# Patient Record
Sex: Female | Born: 1940 | Race: White | Hispanic: No | Marital: Married | Smoking: Former smoker
Health system: Southern US, Community
[De-identification: ages and names within clinical notes are randomized; demographics above are authoritative.]

## PROBLEM LIST (undated history)

## (undated) DIAGNOSIS — D751 Secondary polycythemia: Secondary | ICD-10-CM

## (undated) DIAGNOSIS — F329 Major depressive disorder, single episode, unspecified: Secondary | ICD-10-CM

## (undated) DIAGNOSIS — G709 Myoneural disorder, unspecified: Secondary | ICD-10-CM

## (undated) DIAGNOSIS — B372 Candidiasis of skin and nail: Secondary | ICD-10-CM

## (undated) DIAGNOSIS — E785 Hyperlipidemia, unspecified: Secondary | ICD-10-CM

## (undated) DIAGNOSIS — I219 Acute myocardial infarction, unspecified: Secondary | ICD-10-CM

## (undated) DIAGNOSIS — I10 Essential (primary) hypertension: Secondary | ICD-10-CM

## (undated) DIAGNOSIS — R739 Hyperglycemia, unspecified: Secondary | ICD-10-CM

## (undated) DIAGNOSIS — L409 Psoriasis, unspecified: Secondary | ICD-10-CM

## (undated) DIAGNOSIS — M109 Gout, unspecified: Secondary | ICD-10-CM

## (undated) DIAGNOSIS — C801 Malignant (primary) neoplasm, unspecified: Secondary | ICD-10-CM

## (undated) DIAGNOSIS — I2510 Atherosclerotic heart disease of native coronary artery without angina pectoris: Secondary | ICD-10-CM

## (undated) DIAGNOSIS — E669 Obesity, unspecified: Secondary | ICD-10-CM

## (undated) DIAGNOSIS — J309 Allergic rhinitis, unspecified: Secondary | ICD-10-CM

## (undated) DIAGNOSIS — B009 Herpesviral infection, unspecified: Secondary | ICD-10-CM

## (undated) DIAGNOSIS — A0472 Enterocolitis due to Clostridium difficile, not specified as recurrent: Secondary | ICD-10-CM

## (undated) DIAGNOSIS — F32A Depression, unspecified: Secondary | ICD-10-CM

## (undated) DIAGNOSIS — K7460 Unspecified cirrhosis of liver: Secondary | ICD-10-CM

## (undated) HISTORY — PX: BREAST SURGERY: SHX581

## (undated) HISTORY — PX: TOTAL ABDOMINAL HYSTERECTOMY W/ BILATERAL SALPINGOOPHORECTOMY: SHX83

## (undated) HISTORY — DX: Depression, unspecified: F32.A

## (undated) HISTORY — DX: Acute myocardial infarction, unspecified: I21.9

## (undated) HISTORY — PX: COLONOSCOPY: SHX174

## (undated) HISTORY — DX: Gout, unspecified: M10.9

## (undated) HISTORY — DX: Unspecified cirrhosis of liver: K74.60

## (undated) HISTORY — DX: Psoriasis, unspecified: L40.9

## (undated) HISTORY — DX: Allergic rhinitis, unspecified: J30.9

## (undated) HISTORY — DX: Secondary polycythemia: D75.1

## (undated) HISTORY — DX: Major depressive disorder, single episode, unspecified: F32.9

## (undated) HISTORY — DX: Obesity, unspecified: E66.9

## (undated) HISTORY — DX: Malignant (primary) neoplasm, unspecified: C80.1

## (undated) HISTORY — DX: Essential (primary) hypertension: I10

## (undated) HISTORY — PX: TONSILLECTOMY: SUR1361

## (undated) HISTORY — DX: Myoneural disorder, unspecified: G70.9

## (undated) HISTORY — DX: Enterocolitis due to Clostridium difficile, not specified as recurrent: A04.72

## (undated) HISTORY — DX: Atherosclerotic heart disease of native coronary artery without angina pectoris: I25.10

## (undated) HISTORY — PX: ANKLE FRACTURE SURGERY: SHX122

## (undated) HISTORY — DX: Candidiasis of skin and nail: B37.2

## (undated) HISTORY — PX: CATARACT EXTRACTION, BILATERAL: SHX1313

## (undated) HISTORY — DX: Hyperlipidemia, unspecified: E78.5

---

## 2006-11-18 LAB — HM COLONOSCOPY: HM Colonoscopy: ABNORMAL

## 2007-10-04 ENCOUNTER — Encounter: Payer: Self-pay | Admitting: Family Medicine

## 2008-02-13 ENCOUNTER — Encounter: Payer: Self-pay | Admitting: Family Medicine

## 2008-07-08 DIAGNOSIS — I10 Essential (primary) hypertension: Secondary | ICD-10-CM | POA: Insufficient documentation

## 2008-07-08 DIAGNOSIS — E669 Obesity, unspecified: Secondary | ICD-10-CM | POA: Insufficient documentation

## 2008-09-30 ENCOUNTER — Encounter: Payer: Self-pay | Admitting: Family Medicine

## 2009-01-21 ENCOUNTER — Ambulatory Visit: Payer: Self-pay | Admitting: Occupational Medicine

## 2009-01-21 DIAGNOSIS — S93409A Sprain of unspecified ligament of unspecified ankle, initial encounter: Secondary | ICD-10-CM | POA: Insufficient documentation

## 2009-04-01 ENCOUNTER — Encounter: Payer: Self-pay | Admitting: Family Medicine

## 2009-07-08 ENCOUNTER — Encounter: Payer: Self-pay | Admitting: Family Medicine

## 2009-07-08 LAB — CONVERTED CEMR LAB
Albumin: 3.8 g/dL
Cholesterol: 207 mg/dL
Hgb A1c MFr Bld: 10.8 %
Microalb Creat Ratio: 66.6 mg/g
Potassium: 4.3 meq/L
Sodium: 134 meq/L
Total Protein: 6.7 g/dL
Triglycerides: 338 mg/dL

## 2009-09-13 ENCOUNTER — Ambulatory Visit: Payer: Self-pay | Admitting: Family Medicine

## 2009-09-13 DIAGNOSIS — F329 Major depressive disorder, single episode, unspecified: Secondary | ICD-10-CM | POA: Insufficient documentation

## 2009-09-13 DIAGNOSIS — L03119 Cellulitis of unspecified part of limb: Secondary | ICD-10-CM

## 2009-09-13 DIAGNOSIS — L02419 Cutaneous abscess of limb, unspecified: Secondary | ICD-10-CM | POA: Insufficient documentation

## 2009-09-14 ENCOUNTER — Ambulatory Visit: Payer: Self-pay | Admitting: Family Medicine

## 2009-09-14 ENCOUNTER — Encounter: Payer: Self-pay | Admitting: Family Medicine

## 2009-09-17 ENCOUNTER — Telehealth: Payer: Self-pay | Admitting: Family Medicine

## 2009-09-23 ENCOUNTER — Encounter (HOSPITAL_BASED_OUTPATIENT_CLINIC_OR_DEPARTMENT_OTHER): Admission: RE | Admit: 2009-09-23 | Discharge: 2009-10-30 | Payer: Self-pay | Admitting: Internal Medicine

## 2009-11-24 ENCOUNTER — Ambulatory Visit: Payer: Self-pay | Admitting: Family Medicine

## 2009-11-24 ENCOUNTER — Telehealth: Payer: Self-pay | Admitting: Family Medicine

## 2009-11-24 DIAGNOSIS — D751 Secondary polycythemia: Secondary | ICD-10-CM

## 2009-11-24 DIAGNOSIS — I10 Essential (primary) hypertension: Secondary | ICD-10-CM

## 2009-11-24 DIAGNOSIS — L408 Other psoriasis: Secondary | ICD-10-CM

## 2009-11-25 ENCOUNTER — Encounter: Payer: Self-pay | Admitting: Family Medicine

## 2009-11-25 LAB — CONVERTED CEMR LAB
Eosinophils Relative: 3 % (ref 0–5)
HCT: 48 % — ABNORMAL HIGH (ref 36.0–46.0)
Hemoglobin: 15.6 g/dL — ABNORMAL HIGH (ref 12.0–15.0)
Lymphocytes Relative: 20 % (ref 12–46)
MCV: 92.5 fL (ref 78.0–100.0)
RDW: 13.6 % (ref 11.5–15.5)

## 2009-11-26 ENCOUNTER — Encounter (INDEPENDENT_AMBULATORY_CARE_PROVIDER_SITE_OTHER): Payer: Self-pay | Admitting: *Deleted

## 2009-12-02 ENCOUNTER — Encounter: Payer: Self-pay | Admitting: Family Medicine

## 2009-12-15 ENCOUNTER — Ambulatory Visit: Payer: Self-pay | Admitting: Family Medicine

## 2009-12-15 DIAGNOSIS — E785 Hyperlipidemia, unspecified: Secondary | ICD-10-CM

## 2009-12-22 ENCOUNTER — Ambulatory Visit: Payer: Self-pay | Admitting: Family Medicine

## 2009-12-22 DIAGNOSIS — L0291 Cutaneous abscess, unspecified: Secondary | ICD-10-CM | POA: Insufficient documentation

## 2009-12-22 DIAGNOSIS — L0390 Cellulitis, unspecified: Secondary | ICD-10-CM

## 2009-12-24 ENCOUNTER — Encounter: Payer: Self-pay | Admitting: Family Medicine

## 2010-01-09 ENCOUNTER — Encounter: Payer: Self-pay | Admitting: Family Medicine

## 2010-01-12 ENCOUNTER — Ambulatory Visit: Payer: Self-pay | Admitting: Family Medicine

## 2010-02-17 ENCOUNTER — Ambulatory Visit: Payer: Self-pay | Admitting: Family Medicine

## 2010-02-17 DIAGNOSIS — R0602 Shortness of breath: Secondary | ICD-10-CM | POA: Insufficient documentation

## 2010-02-24 ENCOUNTER — Encounter: Payer: Self-pay | Admitting: Family Medicine

## 2010-02-26 ENCOUNTER — Encounter: Payer: Self-pay | Admitting: Family Medicine

## 2010-03-03 ENCOUNTER — Encounter: Payer: Self-pay | Admitting: Family Medicine

## 2010-03-03 DIAGNOSIS — I519 Heart disease, unspecified: Secondary | ICD-10-CM | POA: Insufficient documentation

## 2010-03-31 ENCOUNTER — Encounter: Payer: Self-pay | Admitting: Family Medicine

## 2010-04-07 ENCOUNTER — Ambulatory Visit: Payer: Self-pay | Admitting: Family Medicine

## 2010-04-16 ENCOUNTER — Encounter: Payer: Self-pay | Admitting: Family Medicine

## 2010-05-07 ENCOUNTER — Ambulatory Visit: Payer: Self-pay | Admitting: Family Medicine

## 2010-05-07 LAB — CONVERTED CEMR LAB
Bilirubin Urine: NEGATIVE
Ketones, urine, test strip: NEGATIVE
Protein, U semiquant: NEGATIVE
Urobilinogen, UA: 0.2

## 2010-05-08 LAB — CONVERTED CEMR LAB
CO2: 18 meq/L — ABNORMAL LOW (ref 19–32)
Glucose, Bld: 354 mg/dL — ABNORMAL HIGH (ref 70–99)
Potassium: 4.4 meq/L (ref 3.5–5.3)
Sodium: 136 meq/L (ref 135–145)

## 2010-05-11 ENCOUNTER — Encounter (INDEPENDENT_AMBULATORY_CARE_PROVIDER_SITE_OTHER): Payer: Self-pay

## 2010-06-02 ENCOUNTER — Encounter: Payer: Self-pay | Admitting: Family Medicine

## 2010-06-02 LAB — CONVERTED CEMR LAB
AST: 35 units/L
Alkaline Phosphatase: 76 units/L
BUN: 12 mg/dL
CO2: 24 meq/L
Glucose: 292 mg/dL
Total Bilirubin: 0.8 mg/dL

## 2010-06-05 ENCOUNTER — Encounter: Payer: Self-pay | Admitting: Family Medicine

## 2010-06-30 ENCOUNTER — Ambulatory Visit: Payer: Self-pay | Admitting: Family Medicine

## 2010-06-30 DIAGNOSIS — B0089 Other herpesviral infection: Secondary | ICD-10-CM

## 2010-07-10 ENCOUNTER — Ambulatory Visit: Payer: Self-pay | Admitting: Family Medicine

## 2010-07-13 ENCOUNTER — Telehealth: Payer: Self-pay | Admitting: Family Medicine

## 2010-08-07 ENCOUNTER — Encounter: Payer: Self-pay | Admitting: Family Medicine

## 2010-08-23 ENCOUNTER — Emergency Department (HOSPITAL_COMMUNITY): Admission: EM | Admit: 2010-08-23 | Discharge: 2010-08-23 | Payer: Self-pay | Admitting: Emergency Medicine

## 2010-11-12 ENCOUNTER — Telehealth: Payer: Self-pay | Admitting: Family Medicine

## 2010-11-16 ENCOUNTER — Encounter: Payer: Self-pay | Admitting: Family Medicine

## 2010-11-16 ENCOUNTER — Ambulatory Visit
Admission: RE | Admit: 2010-11-16 | Discharge: 2010-11-16 | Payer: Self-pay | Source: Home / Self Care | Attending: Family Medicine | Admitting: Family Medicine

## 2010-11-16 DIAGNOSIS — R635 Abnormal weight gain: Secondary | ICD-10-CM | POA: Insufficient documentation

## 2010-11-16 DIAGNOSIS — L2089 Other atopic dermatitis: Secondary | ICD-10-CM | POA: Insufficient documentation

## 2010-11-16 DIAGNOSIS — R42 Dizziness and giddiness: Secondary | ICD-10-CM | POA: Insufficient documentation

## 2010-11-16 LAB — CONVERTED CEMR LAB
Basophils Absolute: 0.1 K/uL
Basophils Relative: 1 %
Eosinophils Absolute: 0.4 K/uL
Eosinophils Relative: 3 %
HCT: 47.7 % — ABNORMAL HIGH
Hemoglobin: 15.8 g/dL — ABNORMAL HIGH
Lymphocytes Relative: 20 %
Lymphs Abs: 2.6 K/uL
MCHC: 33.2 g/dL
MCV: 93.9 fL
Monocytes Absolute: 0.8 K/uL
Monocytes Relative: 6 %
Neutro Abs: 9.2 K/uL — ABNORMAL HIGH
Neutrophils Relative %: 70 %
Platelets: 450 K/uL — ABNORMAL HIGH
RBC: 5.08 M/uL
RDW: 14.1 %
WBC: 13.1 10*3/microliter — ABNORMAL HIGH

## 2010-11-17 LAB — CONVERTED CEMR LAB
ALT: 26 units/L (ref 0–35)
AST: 38 units/L — ABNORMAL HIGH (ref 0–37)
Alkaline Phosphatase: 100 units/L (ref 39–117)
BUN: 8 mg/dL (ref 6–23)
Chloride: 99 meq/L (ref 96–112)
Creatinine, Ser: 0.75 mg/dL (ref 0.40–1.20)
HDL: 31 mg/dL — ABNORMAL LOW (ref >39)
LDL Cholesterol: 116 mg/dL — ABNORMAL HIGH (ref 0–99)
TSH: 2.535 microintl units/mL (ref 0.350–4.500)
Total Bilirubin: 0.9 mg/dL (ref 0.3–1.2)
Total CHOL/HDL Ratio: 6.8
VLDL: 64 mg/dL — ABNORMAL HIGH (ref 0–40)

## 2010-11-17 NOTE — Letter (Signed)
Summary: Primary Care Consult Scheduled Letter  [facility] [place]. [place], [place] [address]   Phone: [phone]  Fax: [phone]      11/26/2009 MRN: [id number]  [facility] [address] [place], [place]  [address]    Dear Ms. Amanda [name],      We have scheduled an appointment for you.  At the recommendation of Dr.BOWEN , we have scheduled you a consult with [facility] ,[place] , DR SLATKOFF on FEBRUARY  15,2011  at _8:30AM.  Their address is_[address] , [place] . The office phone number is [phone].  If this appointment day and time is not convenient for you, please feel free to call the office of the doctor you are being referred to at the number listed above and reschedule the appointment.     It is important for you to keep your scheduled appointments. We are here to make sure you are given good patient care. If you have questions or you have made changes to your appointment, please notify us at  [phone] , ask for   HELEN.    Thank you,  Patient Care Coordinator [place] [facility]

## 2010-11-17 NOTE — Assessment & Plan Note (Signed)
Summary: f/u sugars/ BP   Vital Signs:  Patient profile:   70 year old female Height:      63 inches Weight:      202 pounds BMI:     35.91 O2 Sat:      94 % on Room air Pulse rate:   90 / minute BP sitting:   156 / 87  (left arm) Cuff size:   large  Vitals Entered By: Sherlean Foot CMA (April 07, 2010 9:36 AM)  O2 Flow:  Room air CC: F/U DM.   Primary Care Provider:  Loyal Gambler DO  CC:  F/U DM.Marland Kitchen  History of Present Illness: 70 yo WF presents for f/u sugars.  She is on Novolog Mix 70/30 60 units two times a day and taking JanuMet once a day.  Admits to non ahderence.  Her AM fasting sugars are in the 200s.  She has not seen a nutritionist.  She is getting no exercise.  She is non adherent to her diabetic diet.  Her bedtime readings are up to the low 300s.  She feels 'low' during the afternoon but is not checking her sugars.  She feels sweaty and dizzy then eats some candy and then feels better.  She eats breakfast at 10 am then lunch at 12 and dinner at 5.    Her A1C is up from 9 to 11. she continues to have neuropathic pain in her feet and legs.  She actually saw podiatry and neurology after her last visit with me but only had steroid injections done and does not want to go back.  They did not help.  She is not yet on neurontin.       Current Medications (verified): 1)  Pravastatin Sodium 40 Mg Tabs (Pravastatin Sodium) .... Take 1 Tab By Mouth At Bedtime 2)  Novolog Mix 70/30 Flexpen 70-30 % Susp (Insulin Aspart Prot & Aspart) .... 60 Units [place] Injection Two Times A Day, Take With Breakfast and Dinner 3)  Metoprolol Tartrate 100 Mg Tabs (Metoprolol Tartrate) .Marland Kitchen.. 1 Tab By Mouth Bid 4)  Triamcinolone 0.1% Cream: Eucerin 1:1 .... Apply To Psoriasis Two Times A Day X 10 Days 5)  Lisinopril-Hydrochlorothiazide 20-12.5 Mg Tabs (Lisinopril-Hydrochlorothiazide) .Marland Kitchen.. 1 Tab By Mouth Qam 6)  Janumet 50-500 Mg Tabs (Sitagliptin-Metformin Hcl) .Marland Kitchen.. 1 Tab By Mouth Once Daily With  Dinner 7)  Aspir-Low 81 Mg Tbec (Aspirin) .Marland Kitchen.. 1 Tab By Mouth Daily 8)  Fluoxetine Hcl 20 Mg Caps (Fluoxetine Hcl) .Marland Kitchen.. 1 Capsule By Mouth Once Daily 9)  Nystop 100000 Unit/gm Powd (Nystatin) .... Apply Two Times A Day Under Both Breasts. 10)  Hydroxyurea 500 Mg Caps (Hydroxyurea) .... Take 1 Cap By Mouth Once Daily As Directed 11)  Ketoconazole 2 % Crea (Ketoconazole) .... Apply Two Times A Day  Allergies (verified): No Known Drug Allergies  Past History:  Past Medical History: Reviewed history from 01/12/2010 and no changes required. HTN high cholesterol T2DM 2008 Depression obesity psoriasis polycythemia  Dr Elease [facility] hematology Dr Buford Dresser optho Colonoscopy due 2013 CT for pulm nodule 2010  Social History: Reviewed history from 11/24/2009 and no changes required. Married, son and daughter in [place]. 2 grandsons. Quit smoking 1993 Alcohol use-no Drug use-no No exercise . 6 cups of coffee daily  Review of Systems      See HPI  Physical Exam  General:  alert, well-developed, well-nourished, and well-hydrated.  obese Head:  normocephalic and atraumatic.   Lungs:  normal respiratory effort, no intercostal retractions,  no accessory muscle use, and normal breath sounds.   Heart:  normal rate, regular rhythm, and no murmur.   Extremities:  no LE edema Skin:  color normal.   Psych:  good eye contact, not anxious appearing, and not depressed appearing.     Impression & Recommendations:  Problem # 1:  DIABETES MELLITUS, TYPE II (ICD-250.00) Assessment Deteriorated Poor control of T2DM with A1C of 11.6.  This is mostly due to non compliance with regular use of insulin and poor diet.  Explained to pt that is is hard for me to manage her diabetes if she is inconsistent with both.  Nutrition referral has been made.  Samples of Novolog Mix pen given.  F/U in 3 mos. Her updated medication list for this problem includes:    Novolog Mix 70/30 Flexpen 70-30 % Susp (Insulin  aspart prot & aspart) .Marland KitchenMarland KitchenMarland KitchenMarland Kitchen 60 units [place] injection two times a day, take with breakfast and dinner    Lisinopril-hydrochlorothiazide 20-12.5 Mg Tabs (Lisinopril-hydrochlorothiazide) .Marland Kitchen... 2  tab by mouth qam    Janumet 50-500 Mg Tabs (Sitagliptin-metformin hcl) .Marland Kitchen... 1 tab by mouth once daily with dinner    Aspir-low 81 Mg Tbec (Aspirin) .Marland Kitchen... 1 tab by mouth daily  Orders: Fingerstick (76195) Hemoglobin A1C (09326) Nutrition Referral (Nutrition)  Labs Reviewed: Creat: 0.6 (07/08/2009)    Reviewed HgBA1c results: 11.5 (04/07/2010)  9.6 (12/15/2009)  Problem # 2:  ESSENTIAL HYPERTENSION, BENIGN (ICD-401.1) Assessment: Deteriorated Not at goal.  Increased Lisinopril/HCTZ from 1 tab to 2 tabs once a day.  Continue Metoprolol.   Her updated medication list for this problem includes:    Metoprolol Tartrate 100 Mg Tabs (Metoprolol tartrate) .Marland Kitchen... 1 tab by mouth bid    Lisinopril-hydrochlorothiazide 20-12.5 Mg Tabs (Lisinopril-hydrochlorothiazide) .Marland Kitchen... 2  tab by mouth qam  BP today: 156/87 Prior BP: 157/79 (02/17/2010)  Labs Reviewed: K+: 4.3 (07/08/2009) Creat: : 0.6 (07/08/2009)   Chol: 207 (07/08/2009)   HDL: 34 (07/08/2009)   LDL: 105 (07/08/2009)   TG: 338 (07/08/2009)  Problem # 3:  DEPRESSION, SEVERE (ICD-311) She has made some strides into finding friends at the temple in [place] and has started to learn boundaries with her grown daugther.  She plans for she and her husband to move to [place].  Will go up on her fluoxetine dose as she continues to c/o irritability. Her updated medication list for this problem includes:    Fluoxetine Hcl 40 Mg Caps (Fluoxetine hcl) .Marland Kitchen... 1 capsule by mouth qam  Complete Medication List: 1)  Pravastatin Sodium 40 Mg Tabs (Pravastatin sodium) .... Take 1 tab by mouth at bedtime 2)  Novolog Mix 70/30 Flexpen 70-30 % Susp (Insulin aspart prot & aspart) .... 60 units [place] injection two times a day, take with breakfast and dinner 3)  Metoprolol Tartrate 100 Mg  Tabs (Metoprolol tartrate) .Marland Kitchen.. 1 tab by mouth bid 4)  Triamcinolone 0.1% Cream: Eucerin 1:1  .... Apply to psoriasis two times a day x 10 days 5)  Lisinopril-hydrochlorothiazide 20-12.5 Mg Tabs (Lisinopril-hydrochlorothiazide) .... 2  tab by mouth qam 6)  Janumet 50-500 Mg Tabs (Sitagliptin-metformin hcl) .Marland Kitchen.. 1 tab by mouth once daily with dinner 7)  Aspir-low 81 Mg Tbec (Aspirin) .Marland Kitchen.. 1 tab by mouth daily 8)  Fluoxetine Hcl 40 Mg Caps (Fluoxetine hcl) .Marland Kitchen.. 1 capsule by mouth qam 9)  Hydroxyurea 500 Mg Caps (Hydroxyurea) .... Take 1 cap by mouth once daily as directed 10)  Ketoconazole 2 % Crea (Ketoconazole) .... Apply two times a day 11)  Gabapentin 300 Mg Caps (Gabapentin) .Marland Kitchen.. 1 capsule by mouth at bedtime x 3 days then increase to 2 capsules by mouth qhs  Patient Instructions: 1)  Increase Fluoxetine to 40 mg/ day. 2)  Stay on current meds. 3)  REferral made to nutritionist. 4)  Increase Lisinopril/ HCTZ to 2 tabs each AM for high BP. 5)  Start gabapentin at bedtime for neuropathy pain. 6)  Return for f/u diabetic neuropathy and HTN  in 4 wks Prescriptions: LISINOPRIL-HYDROCHLOROTHIAZIDE 20-12.5 MG TABS (LISINOPRIL-HYDROCHLOROTHIAZIDE) 2  tab by mouth qAM  #60 x 3   Entered and Authorized by:   Loyal Gambler DO   Signed by:   Loyal Gambler DO on 04/07/2010   Method used:   Electronically to        [id number]* (retail)       [address], [place]  [address]       Ph: [phone]       Fax: [phone]   RxID:   [id number] GABAPENTIN 300 MG CAPS (GABAPENTIN) 1 capsule by mouth at bedtime x 3 days then increase to 2 capsules by mouth qhs  #60 x 1   Entered and Authorized by:   Loyal Gambler DO   Signed by:   Loyal Gambler DO on 04/07/2010   Method used:   Electronically to        [id number]* (retail)       [address], [place]  [address]       Ph: [phone]       Fax: [phone]   RxID:   [id number] FLUOXETINE HCL 40  MG CAPS (FLUOXETINE HCL) 1 capsule by mouth qAM  #30 x 3   Entered and Authorized by:   Loyal Gambler DO   Signed by:   Loyal Gambler DO on 04/07/2010   Method used:   Electronically to        [facility] [phone]* (retail)       [address]       [place], [place]  [address]       Ph: [phone]       Fax: [phone]   RxID:   [id number]   Laboratory Results   Blood Tests     HGBA1C: 11.5%   (Normal Range: Non-Diabetic - 3-6%   Control Diabetic - 6-8%)

## 2010-11-17 NOTE — Assessment & Plan Note (Signed)
Summary: f/u DM   Vital Signs:  Patient profile:   70 year old female Height:      63 inches (160.02 cm) Weight:      206 pounds (93.64 kg) BMI:     36.62 O2 Sat:      95 % on Room air Pulse rate:   80 / minute BP sitting:   155 / 82  (left arm) Cuff size:   large  Vitals Entered By: Sherlean Foot CMA (December 15, 2009 9:03 AM)  O2 Flow:  Room air  CC: F/U DM   Primary Care Provider:  Loyal Gambler DO  CC:  F/U DM.  History of Present Illness: 70 yo WF presents for f/u T2DM.  She had some diarrhea from Metformin in the past but only took it for a couple days.  Her A1C today is 9.6 indicating poor control.  She is on Novolin N 40 units two times a day and 25-30 units of Novolog w/ each meal.  She has fair diet control.  No exercise.  Eye exam is UTD with Dr Ayesha Rumpf.  Just got a new glucometer.  For HTN, she is on Metoprolol (75 mg) daily + Cardizem but is not sure why.  No hx of a. fib or tachycardia.  She is not on an ACEi or ARB, but not sure why not.  Denies CP, palpiations or DOE.  She has been feeling overwhelmed and depressed and is not taking any meds for her mood.      Current Medications (verified): 1)  Pravastatin Sodium 40 Mg Tabs (Pravastatin Sodium) .... Take 1 Tab By Mouth At Bedtime 2)  Humulin N 100 Unit/ml Susp (Insulin Isophane Human) .... 40 Units Two Times A Day 3)  Novolog Flexpen 100 Unit/ml Soln (Insulin Aspart) .... Inj 30 Units At Breakfast, 25 Units At [facility] and 30 Units At Dinner 4)  Diltiazem Hcl 120 Mg Tabs (Diltiazem Hcl) .... Take 1 Tab By Mouth Two Times A Day 5)  Metoprolol Tartrate 25 Mg Tabs (Metoprolol Tartrate) .... Take 1 Tab By Mouth Once Daily 6)  Metoprolol Tartrate 50 Mg Tabs (Metoprolol Tartrate) .... Take 1 Tab By Mouth Once Daily 7)  Triamcinolone 0.1% Cream: Eucerin 1:1 .... Apply To Psoriasis Two Times A Day X 10 Days  Allergies (verified): No Known Drug Allergies  Past History:  Past Medical History: Reviewed history from  11/24/2009 and no changes required. HTN high cholesterol T2DM 2008 Depression obesity psoriasis  Family History: Reviewed history from 11/24/2009 and no changes required. Mother died at 69, DM, ESRD, CAD, HTN, high chol Father died of ESRD at 69 sister healthy aunt breast cancer  Social History: Reviewed history from 11/24/2009 and no changes required. Married, son and daughter in [place]. 2 grandsons. Quit smoking 1993 Alcohol use-no Drug use-no No exercise . 6 cups of coffee daily  Review of Systems      See HPI  Physical Exam  General:  alert, well-developed, well-nourished, and well-hydrated.  obese Head:  normocephalic and atraumatic.   Eyes:  pupils equal, pupils round, and pupils reactive to light.  wears glasses Nose:  no nasal discharge.   Mouth:  pharynx pink and moist.   Neck:  no masses.   Lungs:  normal respiratory effort, no intercostal retractions, no accessory muscle use, and normal breath sounds.   Heart:  normal rate, regular rhythm, and no murmur.   Extremities:  no LE edema Skin:  color normal.   Cervical Nodes:  No  lymphadenopathy noted Psych:  good eye contact and flat affect.    Diabetes Management Exam:    Foot Exam (with socks and/or shoes not present):       Sensory-Pinprick/Light touch:          Left medial foot (L-4): normal          Left dorsal foot (L-5): normal          Left lateral foot (S-1): normal          Right medial foot (L-4): normal          Right dorsal foot (L-5): normal          Right lateral foot (S-1): normal       Sensory-Monofilament:          Left foot: normal          Right foot: normal       Inspection:          Left foot: normal          Right foot: normal       Nails:          Left foot: normal          Right foot: normal   Impression & Recommendations:  Problem # 1:  DIABETES MELLITUS, TYPE II (ICD-250.00) Poorly controlled with A1C of 9.6 on too much insulin for her wt. Changing her to Novolog mix  70/30 pen 60 units two times a day with BF and dinner.  Start Janumet to help with insulin resistance.  Closely track numbers.  Has f/u with Pharm D in 2 wks. The following medications were removed from the medication list:    Humulin N 100 Unit/ml Susp (Insulin isophane human) .Marland KitchenMarland KitchenMarland KitchenMarland Kitchen 40 units two times a day Her updated medication list for this problem includes:    Novolog Mix 70/30 Flexpen 70-30 % Susp (Insulin aspart prot & aspart) .Marland KitchenMarland KitchenMarland KitchenMarland Kitchen 60 units [place] injection two times a day, take with breakfast and dinner    Lisinopril-hydrochlorothiazide 20-12.5 Mg Tabs (Lisinopril-hydrochlorothiazide) .Marland Kitchen... 1 tab by mouth qam    Janumet 50-500 Mg Tabs (Sitagliptin-metformin hcl) .Marland Kitchen... 1 tab by mouth once daily with dinner    Aspir-low 81 Mg Tbec (Aspirin) .Marland Kitchen... 1 tab by mouth daily  Orders: Fingerstick (19509) Hemoglobin A1C (32671)  Reviewed HgBA1c results: 9.6 (12/15/2009)  Problem # 2:  ESSENTIAL HYPERTENSION, BENIGN (ICD-401.1) Changed regimen to below, taking out diltiazem for risk of heart block and bradycardia in combo with metoprolol.  Change metoprolol dose and adding ACEi/HCTZ for BP reduction and renal/ caridiac protection as a diabetic.  Will need BMP in 4 wks. The following medications were removed from the medication list:    Diltiazem Hcl 120 Mg Tabs (Diltiazem hcl) .Marland Kitchen... Take 1 tab by mouth two times a day    Metoprolol Tartrate 25 Mg Tabs (Metoprolol tartrate) .Marland Kitchen... Take 1 tab by mouth once daily Her updated medication list for this problem includes:    Metoprolol Tartrate 50 Mg Tabs (Metoprolol tartrate) .Marland Kitchen... 1 tab by mouth bid    Lisinopril-hydrochlorothiazide 20-12.5 Mg Tabs (Lisinopril-hydrochlorothiazide) .Marland Kitchen... 1 tab by mouth qam  BP today: 155/82 Prior BP: 151/65 (11/24/2009)  Problem # 3:  DEPRESSION, SEVERE (ICD-311) Assessment: New PHQ-9 of 26 w/o suicidal ideation and has a good support system at home. Start Fluoxetine and f/u in 1 month.  Call if any problems. Her  updated medication list for this problem includes:    Fluoxetine Hcl 10 Mg Caps (Fluoxetine  hcl) ..... 1 capsule by mouth qam  Problem # 4:  HYPERLIPIDEMIA (ICD-272.4)  Her updated medication list for this problem includes:    Pravastatin Sodium 40 Mg Tabs (Pravastatin sodium) .Marland Kitchen... Take 1 tab by mouth at bedtime  Complete Medication List: 1)  Pravastatin Sodium 40 Mg Tabs (Pravastatin sodium) .... Take 1 tab by mouth at bedtime 2)  Novolog Mix 70/30 Flexpen 70-30 % Susp (Insulin aspart prot & aspart) .... 60 units [place] injection two times a day, take with breakfast and dinner 3)  Metoprolol Tartrate 50 Mg Tabs (Metoprolol tartrate) .Marland Kitchen.. 1 tab by mouth bid 4)  Triamcinolone 0.1% Cream: Eucerin 1:1  .... Apply to psoriasis two times a day x 10 days 5)  Lisinopril-hydrochlorothiazide 20-12.5 Mg Tabs (Lisinopril-hydrochlorothiazide) .Marland Kitchen.. 1 tab by mouth qam 6)  Janumet 50-500 Mg Tabs (Sitagliptin-metformin hcl) .Marland Kitchen.. 1 tab by mouth once daily with dinner 7)  Aspir-low 81 Mg Tbec (Aspirin) .Marland Kitchen.. 1 tab by mouth daily 8)  Fluoxetine Hcl 10 Mg Caps (Fluoxetine hcl) .Marland Kitchen.. 1 capsule by mouth qam  Patient Instructions: 1)  Review Med changes with pharmacist today. 2)  Check AM Fasting 80-120. 3)  2 hr after dinner goal is <150. 4)  Return for f/u mood and BP/ sugars in 4 wks. Prescriptions: FLUOXETINE HCL 10 MG CAPS (FLUOXETINE HCL) 1 capsule by mouth qAM  #30 x 1   Entered and Authorized by:   Loyal Gambler DO   Signed by:   Loyal Gambler DO on 12/15/2009   Method used:   Electronically to        [id number]* (retail)       [address], [place]  [address]       Ph: [phone]       Fax: [phone]   RxID:   [id number] JANUMET 50-500 MG TABS (SITAGLIPTIN-METFORMIN HCL) 1 tab by mouth once daily with dinner  #30 x 3   Entered and Authorized by:   Loyal Gambler DO   Signed by:   Loyal Gambler DO on 12/15/2009   Method used:   Electronically to        [facility]  [id number]* (retail)       [address], [place]  [address]       Ph: [phone]       Fax: [phone]   RxID:   [id number] NOVOLOG MIX 70/30 FLEXPEN 70-30 % SUSP (INSULIN ASPART PROT & ASPART) 60 units [place] injection two times a day, take with breakfast and dinner  #4 pens x 3   Entered and Authorized by:   Loyal Gambler DO   Signed by:   Loyal Gambler DO on 12/15/2009   Method used:   Electronically to        [id number]* (retail)       [address], [place]  [address]       Ph: [phone]       Fax: [phone]   RxID:   [id number] LISINOPRIL-HYDROCHLOROTHIAZIDE 20-12.5 MG TABS (LISINOPRIL-HYDROCHLOROTHIAZIDE) 1 tab by mouth qAM  #30 x 2   Entered and Authorized by:   Loyal Gambler DO   Signed by:   Loyal Gambler DO on 12/15/2009   Method used:   Electronically to        [id number]* (retail)       [address], [place]  [address]       Ph: [phone]       Fax: [phone]   RxID:   [id number] METOPROLOL TARTRATE 50 MG TABS (METOPROLOL TARTRATE) 1 tab by mouth bid  #60 x 3   Entered and Authorized by:   Loyal Gambler DO   Signed by:   Loyal Gambler DO on 12/15/2009   Method used:   Electronically to        [facility] [phone]* (retail)       [address]       [place], [place]  [address]       Ph: [phone]       Fax: [phone]   RxID:   [id number]   Laboratory Results   Blood Tests     HGBA1C: 9.6%   (Normal Range: Non-Diabetic - 3-6%   Control Diabetic - 6-8%)

## 2010-11-17 NOTE — Miscellaneous (Signed)
Summary: echo  Clinical Lists Changes  Problems: Added new problem of DIASTOLIC DYSFUNCTION (GKK-159.4)  Appended Document: echo Clinical Lists Changes  Observations: Added new observation of ECHOINTERP: Location:  [facility] .  Mild LVH EF 60-65% Moderate Grade II diastolic dynfunction   (70/76/1518 13:01)     Echocardiogram  Procedure date:  02/26/2010  Findings:      Location:  [facility] .  Mild LVH EF 60-65% Moderate Grade II diastolic dynfunction     Echocardiogram  Procedure date:  02/26/2010  Findings:      Location:  [facility] .  Mild LVH EF 60-65% Moderate Grade II diastolic dynfunction     Appended Document: echo Pls let pt know that her echocardiogram came back fairly normal.  Good valves and pumping function.  Has some dysfunction with relaxation phase due to weight and HTN.  Loyal Gambler, D.O.  Appended Document: echo Pt aware and will discuss w/ you at next OV if she has any questions.

## 2010-11-17 NOTE — Miscellaneous (Signed)
Summary: labs from Dr Lewanda Rife  Clinical Lists Changes  Observations: Added new observation of CALCIUM: 9.7 mg/dL (06/02/2010 10:44) Added new observation of ALBUMIN: 4.3 g/dL (06/02/2010 10:44) Added new observation of PROTEIN, TOT: 6.7 g/dL (06/02/2010 10:44) Added new observation of SGPT (ALT): 21 units/L (06/02/2010 10:44) Added new observation of SGOT (AST): 35 units/L (06/02/2010 10:44) Added new observation of ALK PHOS: 76 units/L (06/02/2010 10:44) Added new observation of BILI TOTAL: 0.8 mg/dL (06/02/2010 10:44) Added new observation of CREATININE: 0.77 mg/dL (06/02/2010 10:44) Added new observation of BUN: 12 mg/dL (06/02/2010 10:44) Added new observation of CO2 PLSM/SER: 24 meq/L (06/02/2010 10:44) Added new observation of CL SERUM: 101 meq/L (06/02/2010 10:44) Added new observation of K SERUM: 4.5 meq/L (06/02/2010 10:44) Added new observation of NA: 135 meq/L (06/02/2010 10:44) Added new observation of GLU MON POC: 292 mg/dL (06/02/2010 10:44)

## 2010-11-17 NOTE — Progress Notes (Signed)
Summary: change psoriasis cream due to cost  Phone Note From Pharmacy   Caller: Patient Caller: [facility]  [address] [phone]* Summary of Call: No generic equal of dovonox cream and Pt cant afford. Please advise Initial call taken by: Sherlean Foot CMA,  November 24, 2009 12:33 PM  Follow-up for Phone Call        I will change her to a steroid cream mixed with Eucerin to apply two times a day x 10 days.  After 10 days, have her use PLAIN Eucerin cream 2 x a day and repeat 10 day cycle once monthly as needed. Follow-up by: Loyal Gambler DO,  November 24, 2009 12:57 PM    New/Updated Medications: * TRIAMCINOLONE 0.1% CREAM: EUCERIN 1:1 apply to psoriasis two times a day x 10 days Prescriptions: TRIAMCINOLONE 0.1% CREAM: EUCERIN 1:1 apply to psoriasis two times a day x 10 days  #1 tub x 1   Entered and Authorized by:   Loyal Gambler DO   Signed by:   Loyal Gambler DO on 11/24/2009   Method used:   Printed then faxed to ...       [facility]  [address] [phone]* (retail)       [address]       [place], [place]  [address]       Ph: [phone]       Fax: [phone]   RxID:   [id number]

## 2010-11-17 NOTE — Progress Notes (Signed)
Summary: Boil worse.  Phone Note Call from Patient   Caller: Patient Summary of Call: Pt states the boil on her neck has become very painful. Pt's daughter said boil has not come to "a head" but it has bothered Pt all weekend. Pt requests something be done for her. Do you want to see Pt today? Please advise. Initial call taken by: Sherlean Foot CMA,  July 13, 2010 1:24 PM  Follow-up for Phone Call        Start on Doxycyline for the boil.  If it comes to a head/ gets worse, schedule appt.   Follow-up by: Loyal Gambler DO,  July 13, 2010 2:53 PM    New/Updated Medications: DOXYCYCLINE HYCLATE 100 MG CAPS (DOXYCYCLINE HYCLATE) 1 capsule by mouth q 12 hrs x 10 days Prescriptions: DOXYCYCLINE HYCLATE 100 MG CAPS (DOXYCYCLINE HYCLATE) 1 capsule by mouth q 12 hrs x 10 days  #20 x 0   Entered and Authorized by:   Loyal Gambler DO   Signed by:   Loyal Gambler DO on 07/13/2010   Method used:   Electronically to        [id number]* (retail)       [address]       [place], [place]  [address]       Ph: [phone]       Fax: [phone]   RxID:   [id number]   Appended Document: Boil worse. Called Pt and informed her of the above. Pt states she had been taking left over ABX since Fri. I advised Pt to take new Rx and f/u if needed. Pt said, "thank you" and hung up on me.

## 2010-11-17 NOTE — Miscellaneous (Signed)
Summary: Comprehensice Med Review Follow Up  Clinical Lists Changes  Comprehensive Medication Review Follow Up  Dr. Valetta Close  I was able to meet with [name] on Monday, 05/11/10, to reveiw her medications.  She stated she was very confused about which medications to take.  She brought in all her medications.  She also stated she was having a hard time affording some of her medications, mainly Novolog 70/30 mix and Janumet.    Medications:  Lisinopril-HCTZ 20-12.5 mg Take 2 tabs in the morning Fluoxetine 40 mg in the morning Metoprolol 100 mg two times a day Hydroxyurea 500 mg two times a day Novolog 70/30 mix 60 units two times a day Aspirin 81 mg in the morning Janumet 50-500 mg at dinner time Pravastatin 40 mg at dinner time  Assessment/Plan: 1.  Medications:  She had multiple bottles of fluoxetine, metoprolol, and lisinopril-hctz.  I was able to combine the bottles and have her medications in the correct, most recently prescribed instruction bottle.  I also wrote out a schedule of when to take the medications.  Since most of her medications are twice a day, I was able to schedule them for the morning at breakfast and at dinner to help with her compliance.  2.  Discontinued medications:  She had a bottle of Diltiazem and Bactrim which she was no longer using.  I discarded the Diltiazem bottle but she wanted to keep the Bactrim in case she was instructed to take an antibiotic for her boils again.  She also had a bottle of Gabapentin but had not started it.  She was suppose to use it for neuropathy or "charlie horses" as she called it.  She stated the symptoms went a way.  I took the Gabapentin and Bactrim bottles and wrapped a rubberband around them to help know not to take them unless needed.    3.  Patient Assistance Programs:  I have scheduled her to come back to see me on Monday, May 18, 2010 to go over PAP forms for Novolog 70/30 mix and Janumet.  She is to bring in her financial  documents as well.  Please feel free to contact me if you have any questions.  Deanne Coffer, PharmD Clinical Pharmacist (contracted) [place].

## 2010-11-17 NOTE — Miscellaneous (Signed)
Summary: no retinopathy  Clinical Lists Changes  Observations: Added new observation of DIAB EYE EX: no retinopathy ( Dr Kathlen Mody) (07/31/2010 17:00)

## 2010-11-17 NOTE — Consult Note (Signed)
Summary: [facility]   Imported By: Edmonia James 12/17/2009 09:07:59  _____________________________________________________________________  External Attachment:    Type:   Image     Comment:   External Document

## 2010-11-17 NOTE — Letter (Signed)
Summary: Depression Questionnaire/[facility]  Depression Questionnaire/[facility]   Imported By: Edmonia James 12/18/2009 09:54:47  _____________________________________________________________________  External Attachment:    Type:   Image     Comment:   External Document

## 2010-11-17 NOTE — Assessment & Plan Note (Signed)
Summary: NOV DM   Vital Signs:  Patient profile:   71 year old female Height:      63 inches Weight:      205 pounds BMI:     36.45 O2 Sat:      95 % on Room air Temp:     98.4 degrees F oral Pulse rate:   77 / minute BP sitting:   151 / 65  (left arm) Cuff size:   regular  Vitals Entered By: Sherlean Foot CMA (November 24, 2009 9:59 AM)  O2 Flow:  Room air CC: New to est.   Primary Care Provider:  Loyal Gambler DO  CC:  New to est..  History of Present Illness: 70 yo WF presents for NOV, transfering care from [facility].  She has hx of T2DM x  ~3 yrs, on insulin alone.  She has problems with depression and anxiety, worsened after the move here.  She really would like to move back to [place].  Moved here to be closer to her son.  Her grown daughter, Shirlean Mylar, lives with she and her husband.  She feels that her Zoloft does not help.  She also has psoriasis, high cholesterol, HTN and polycythemia vera.   She is due for a CBC and reports her last phlebotomy was in April 2010.  She is not using anything on her psoriasis which is getting worse.  she was seen and treated recently at the wound center, Dr Sharlett Iles for a ? spider bite on her LLE.   Current Medications (verified): 1)  Pravastatin Sodium 40 Mg Tabs (Pravastatin Sodium) .... Take 1 Tab By Mouth At Bedtime 2)  Humulin R 100 Unit/ml Soln (Insulin Regular Human) .... Inj 40 Units Two Times A Day 3)  Novolog Flexpen 100 Unit/ml Soln (Insulin Aspart) .... Inj 30 Units At Breakfast, 25 Units At [facility] and 30 Units At Dinner 4)  Diltiazem Hcl 120 Mg Tabs (Diltiazem Hcl) .... Take 1 Tab By Mouth Two Times A Day 5)  Metoprolol Tartrate 25 Mg Tabs (Metoprolol Tartrate) .... Take 1 Tab By Mouth Once Daily 6)  Metoprolol Tartrate 50 Mg Tabs (Metoprolol Tartrate) .... Take 1 Tab By Mouth Once Daily 7)  Sertraline Hcl 100 Mg Tabs (Sertraline Hcl) .... Take 1 Tab By Mouth Once Daily  Allergies (verified): No Known Drug Allergies  Past  History:  Past Medical History: HTN high cholesterol T2DM 2008 Depression obesity psoriasis  Past Surgical History: breast surgery - milk duct TAH with BSO for heavy periods   Family History: Mother died at 21, DM, ESRD, CAD, HTN, high chol Father died of ESRD at 21 sister healthy aunt breast cancer  Social History: Married, son and daughter in [place]. 2 grandsons. Quit smoking 1993 Alcohol use-no Drug use-no No exercise . 6 cups of coffee daily  Review of Systems       no fevers/sweats/weakness, unexplained wt loss/gain, no change in vision, no difficulty hearing, ringing in ears, no hay fever/allergies, no CP/discomfort, no palpitations, no breast lump/nipple discharge, no cough/wheeze, no blood in stool, no N/V/+D, no nocturia, no leaking urine, no unusual vag bleeding, no vaginal/penile discharge, no muscle/joint pain, no rash, no new/changing mole, no HA, no memory loss,+ anxiety, + sleep problem, + depression, no unexplained lumps, no easy bruising/bleeding, no concern with sexual function   Physical Exam  General:  obese WF in NAD Head:  normocephalic and atraumatic.   Eyes:  wears glasses Ears:  no external deformities.   Nose:  no nasal discharge.   Mouth:  dentures in place, o/p pink and moist Neck:  no masses.   Lungs:  normal respiratory effort, no intercostal retractions, no accessory muscle use, and normal breath sounds.   Heart:  normal rate, regular rhythm, and no murmur.   Extremities:  no LE edema Skin:  xerotic skin with excoriation on extremities.  psoriatic plaques R temple, forearms, legs. Healed spider bite over L anterior skin with purplish scar.  Cervical Nodes:  No lymphadenopathy noted Psych:  good eye contact, not anxious appearing, and flat affect.     Impression & Recommendations:  Problem # 1:  DIABETES MELLITUS, TYPE II (ICD-250.00) I will get her old records.  Per her report, her last A1C was 11-17 so she is not due today.   Seeing Dr Ayesha Rumpf for diabetic eye exams. Will see when her urine microalbumin is due.  She needs a new meter/ test strips and she agrees to call her insurance co. to see what is covered.  We briefly discussed the importance of regular meals, diabetic diet, exercise and wt loss. Reviewed glucose goals.  RTC in 3 wks to review home readings.  Will see if she can start Metformin.    Her updated medication list for this problem includes:    Humulin N 100 Unit/ml Susp (Insulin isophane human) .Marland KitchenMarland KitchenMarland KitchenMarland Kitchen 40 units two times a day    Novolog Flexpen 100 Unit/ml Soln (Insulin aspart) ..... Inj 30 units at breakfast, 25 units at lunch and 30 units at dinner  Problem # 2:  DEPRESSION (ICD-311) Assessment: Deteriorated Not doing well on Sertraline 100 mg/ day. She has many psychosocial factors playing a part and counseling may be a good addition. PHQ-9 at 3 wks f/u.  Plan to start Fluoxetine.  Problem # 3:  POLYCYTHEMIA (ICD-289.0) Due for a CBC and referrral to hematologist in [place] for treatment. Orders: T-CBC No Diff ([id number]) Hematology Referral (Hematology)  Problem # 4:  ESSENTIAL HYPERTENSION, BENIGN (ICD-401.1) BP high today.  F/U in 3 wks.  Goal is <130/80.  Get records to see why she is not on an ACEi or ARB with DM.   The following medications were removed from the medication list:    Metoprolol-hydrochlorothiazide 50-25 Mg Tabs (Metoprolol-hydrochlorothiazide) .Marland Kitchen... Twice daily Her updated medication list for this problem includes:    Diltiazem Hcl 120 Mg Tabs (Diltiazem hcl) .Marland Kitchen... Take 1 tab by mouth two times a day    Metoprolol Tartrate 25 Mg Tabs (Metoprolol tartrate) .Marland Kitchen... Take 1 tab by mouth once daily    Metoprolol Tartrate 50 Mg Tabs (Metoprolol tartrate) .Marland Kitchen... Take 1 tab by mouth once daily  BP today: 151/65 Prior BP: 135/78 (09/14/2009)  Problem # 5:  PSORIASIS (ICD-696.1) Flared up w/o treatment. Start Dovonex topical treatment. Go back to using dove soap.  Complete  Medication List: 1)  Pravastatin Sodium 40 Mg Tabs (Pravastatin sodium) .... Take 1 tab by mouth at bedtime 2)  Humulin N 100 Unit/ml Susp (Insulin isophane human) .... 40 units two times a day 3)  Novolog Flexpen 100 Unit/ml Soln (Insulin aspart) .... Inj 30 units at breakfast, 25 units at lunch and 30 units at dinner 4)  Diltiazem Hcl 120 Mg Tabs (Diltiazem hcl) .... Take 1 tab by mouth two times a day 5)  Metoprolol Tartrate 25 Mg Tabs (Metoprolol tartrate) .... Take 1 tab by mouth once daily 6)  Metoprolol Tartrate 50 Mg Tabs (Metoprolol tartrate) .... Take 1 tab by mouth once daily 7)  Dovonex 0.005 % Crea (Calcipotriene) .... Apply to psoriasis two times a day  Patient Instructions: 1)  Labs today. 2)  Will get you in with Dr Georgiann Cocker to f/u polycythemia. 3)  Start checking sugars 4)  AM fasting 80-120 goal. 5)  2 hrs after dinner <150 is goal. 6)  Bring in log book at f/u visit. 7)  Start Dovonex for psoriasis. 8)  Return for f/u visit in 3 wks. 9)  Cut Sertraline in half.  Take 1/2 tab daily x 1 wk then 1/4 tab once daily x 1 wk then stop. Prescriptions: DOVONEX 0.005 % CREA (CALCIPOTRIENE) apply to psoriasis two times a day  #1 tube x 3   Entered and Authorized by:   Loyal Gambler DO   Signed by:   Loyal Gambler DO on 11/24/2009   Method used:   Electronically to        [facility] [phone]* (retail)       [address]       [place],   [address]       Ph: [phone]       Fax: [phone]   RxID:   [id number]

## 2010-11-17 NOTE — Assessment & Plan Note (Signed)
Summary: f/u breast cellulitis/ sugars/ mood   Vital Signs:  Patient profile:   70 year old female Height:      63 inches Weight:      209 pounds BMI:     37.16 O2 Sat:      96 % on Room air Pulse rate:   84 / minute BP sitting:   169 / 86  (left arm) Cuff size:   large  Vitals Entered By: Sherlean Foot CMA (January 12, 2010 9:36 AM)  O2 Flow:  Room air CC: F/U   Primary Care Provider:  Loyal Gambler DO  CC:  F/U.  History of Present Illness: 70 yo WF presents for f/u R breast abscess.  She saw Dr Madilyn Fireman and was treated with Bactrim DS 3-7 till 3-17.   culture was obtained- negative.  She saw her hematologist for a f/u visit and she told her to use topical milk of magnesia which helped dry it up but she still has pain over her R breast and streaking redness.    She was due for f/u weight gain, hyperglycemia with diabetes, mood and HTN.  She did start on Fluoxetine 10 mg / day a month ago.  Admits to not taking her insulin for weeks due to financial problems.  Mood starting to improve but she is very down on herself about her weight.  She is going to start  Curves.  She really wants to move back on [place] and is taking a trip there this week with her grandson.  She if happy about this.  Her husband does not want to move back so it constantly bothers her.    Current Medications (verified): 1)  Pravastatin Sodium 40 Mg Tabs (Pravastatin Sodium) .... Take 1 Tab By Mouth At Bedtime 2)  Novolog Mix 70/30 Flexpen 70-30 % Susp (Insulin Aspart Prot & Aspart) .... 60 Units [place] Injection Two Times A Day, Take With Breakfast and Dinner 3)  Metoprolol Tartrate 50 Mg Tabs (Metoprolol Tartrate) .Marland Kitchen.. 1 Tab By Mouth Bid 4)  Triamcinolone 0.1% Cream: Eucerin 1:1 .... Apply To Psoriasis Two Times A Day X 10 Days 5)  Lisinopril-Hydrochlorothiazide 20-12.5 Mg Tabs (Lisinopril-Hydrochlorothiazide) .Marland Kitchen.. 1 Tab By Mouth Qam 6)  Janumet 50-500 Mg Tabs (Sitagliptin-Metformin Hcl) .Marland Kitchen.. 1 Tab By Mouth Once Daily  With Dinner 7)  Aspir-Low 81 Mg Tbec (Aspirin) .Marland Kitchen.. 1 Tab By Mouth Daily 8)  Fluoxetine Hcl 10 Mg Caps (Fluoxetine Hcl) .Marland Kitchen.. 1 Capsule By Mouth Qam 9)  Nystop 100000 Unit/gm Powd (Nystatin) .... Apply Two Times A Day Under Both Breasts. 10)  Hydroxyurea 500 Mg Caps (Hydroxyurea) .... Take 1 Cap By Mouth Once Daily As Directed  Allergies (verified): No Known Drug Allergies  Past History:  Past Medical History: HTN high cholesterol T2DM 2008 Depression obesity psoriasis polycythemia  Dr Elease Hashimoto [facility] hematology Dr Buford Dresser optho Colonoscopy due 2013 CT for pulm nodule 2010  Family History: Reviewed history from 11/24/2009 and no changes required. Mother died at 33, DM, ESRD, CAD, HTN, high chol Father died of ESRD at 10 sister healthy aunt breast cancer  Social History: Reviewed history from 11/24/2009 and no changes required. Married, son and daughter in [place]. 2 grandsons. Quit smoking 1993 Alcohol use-no Drug use-no No exercise . 6 cups of coffee daily  Review of Systems      See HPI  Physical Exam  General:  obese WF in NAD Head:  normocephalic and atraumatic.   Mouth:  pharynx pink and moist.  Neck:  no masses.   Lungs:  normal respiratory effort, no intercostal retractions, no accessory muscle use, and normal breath sounds.   Heart:  normal rate, regular rhythm, and no murmur.   Extremities:  no LE edema Skin:  streaking redness R lateral/ inferior and spreading superiorly over the R breast. Abscess improved w/o induration.   Psych:  depressed affect and slightly anxious.     Impression & Recommendations:  Problem # 1:  ABSCESS, SKIN (ICD-682.9) Failing to completely improve with streaking redness and breast pain but no abscess remaining.  Cover for breast cellulitis with 10 more days of Bactrim DS.  It probably has not resolved due to poorly controlled DM with hyperglycemia.  Use Ice packs and Ibuprofen for breast comfort. Call if not seeing  improvements in 10-14 days. The following medications were removed from the medication list:    Sulfamethoxazole-tmp Ds 800-160 Mg Tabs (Sulfamethoxazole-trimethoprim) .Marland Kitchen... Take 1 tablet by mouth two times a day for 10 days Her updated medication list for this problem includes:    Bactrim Ds 800-160 Mg Tabs (Sulfamethoxazole-trimethoprim) .Marland Kitchen... 1 tab by mouth two times a day x 10 days  Problem # 2:  DIABETES MELLITUS, TYPE II (ICD-250.00) Hyperglycemic due to not taking her insulin.  I gave her a sample pen of Novolog Mix today to get her back on meds.  Stay on [place] and f/u in 1 month. Her updated medication list for this problem includes:    Novolog Mix 70/30 Flexpen 70-30 % Susp (Insulin aspart prot & aspart) .Marland KitchenMarland KitchenMarland KitchenMarland Kitchen 60 units [place] injection two times a day, take with breakfast and dinner    Lisinopril-hydrochlorothiazide 20-12.5 Mg Tabs (Lisinopril-hydrochlorothiazide) .Marland Kitchen... 1 tab by mouth qam    Janumet 50-500 Mg Tabs (Sitagliptin-metformin hcl) .Marland Kitchen... 1 tab by mouth once daily with dinner    Aspir-low 81 Mg Tbec (Aspirin) .Marland Kitchen... 1 tab by mouth daily  Orders: Fingerstick (36416) Glucose, (CBG) (63893)  Problem # 3:  DEPRESSION, SEVERE (ICD-311) Starting to improve.  Will increase from 10 mg to 20 mg once daily.  Encouraged her to enjoy her visit to [facility] and talk to her husband about her wishes.   Her updated medication list for this problem includes:    Fluoxetine Hcl 20 Mg Caps (Fluoxetine hcl) .Marland Kitchen... 1 capsule by mouth once daily  Problem # 4:  ESSENTIAL HYPERTENSION, BENIGN (ICD-401.1) Assessment: Deteriorated Increase Metoprolol from 46m to 100 mg two times a day.   RTC for f/u in 1 month. Her updated medication list for this problem includes:    Metoprolol Tartrate 100 Mg Tabs (Metoprolol tartrate) ..Marland Kitchen.. 1 tab by mouth bid    Lisinopril-hydrochlorothiazide 20-12.5 Mg Tabs (Lisinopril-hydrochlorothiazide) ..Marland Kitchen.. 1 tab by mouth qam  BP today: 169/86 Prior BP: 136/64  (12/22/2009)  Labs Reviewed: K+: 4.3 (07/08/2009) Creat: : 0.6 (07/08/2009)   Chol: 207 (07/08/2009)   HDL: 34 (07/08/2009)   LDL: 105 (07/08/2009)   TG: 338 (07/08/2009)  Complete Medication List: 1)  Pravastatin Sodium 40 Mg Tabs (Pravastatin sodium) .... Take 1 tab by mouth at bedtime 2)  Novolog Mix 70/30 Flexpen 70-30 % Susp (Insulin aspart prot & aspart) .... 60 units [place] injection two times a day, take with breakfast and dinner 3)  Metoprolol Tartrate 100 Mg Tabs (Metoprolol tartrate) ..Marland Kitchen. 1 tab by mouth bid 4)  Triamcinolone 0.1% Cream: Eucerin 1:1  .... Apply to psoriasis two times a day x 10 days 5)  Lisinopril-hydrochlorothiazide 20-12.5 Mg Tabs (Lisinopril-hydrochlorothiazide) ..Marland Kitchen. 1 tab by mouth  qam 6)  Janumet 50-500 Mg Tabs (Sitagliptin-metformin hcl) .Marland Kitchen.. 1 tab by mouth once daily with dinner 7)  Aspir-low 81 Mg Tbec (Aspirin) .Marland Kitchen.. 1 tab by mouth daily 8)  Fluoxetine Hcl 20 Mg Caps (Fluoxetine hcl) .Marland Kitchen.. 1 capsule by mouth once daily 9)  Nystop 100000 Unit/gm Powd (Nystatin) .... Apply two times a day under both breasts. 10)  Hydroxyurea 500 Mg Caps (Hydroxyurea) .... Take 1 cap by mouth once daily as directed 11)  Bactrim Ds 800-160 Mg Tabs (Sulfamethoxazole-trimethoprim) .Marland Kitchen.. 1 tab by mouth two times a day x 10 days  Patient Instructions: 1)  make sure you use your Novolog 70-30 60 units twice a day (sample pen given). 2)  Stay on Janumet once daily for diabetes also. 3)  Increase Metoprolol to 100 mg two times a day for high blood pressure readings.   4)  Increase Fluoxetine to 20 mg once daily for mood. 5)  Call if redness and pain on R breast is not improved in 2 wks. 6)  Return for f/u in 3-4 wks.   Prescriptions: BACTRIM DS 800-160 MG TABS (SULFAMETHOXAZOLE-TRIMETHOPRIM) 1 tab by mouth two times a day x 10 days  #20 x 0   Entered and Authorized by:   Loyal Gambler DO   Signed by:   Loyal Gambler DO on 01/12/2010   Method used:   Electronically to        [phone]* (retail)       [address], [place]  [address]       Ph: [phone]       Fax: [phone]   RxID:   [id number] METOPROLOL TARTRATE 100 MG TABS (METOPROLOL TARTRATE) 1 tab by mouth bid  #60 x 3   Entered and Authorized by:   Loyal Gambler DO   Signed by:   Loyal Gambler DO on 01/12/2010   Method used:   Electronically to        [facility] [phone]* (retail)       [address], [place]  [address]       Ph: [phone]       Fax: [phone]   RxID:   [id number] FLUOXETINE HCL 20 MG CAPS (FLUOXETINE HCL) 1 capsule by mouth once daily  #30 x 3   Entered and Authorized by:   Loyal Gambler DO   Signed by:   Loyal Gambler DO on 01/12/2010   Method used:   Electronically to        [facility] [phone]* (retail)       [address], [place]  [address]       Ph: [phone]       Fax: [phone]   RxID:   [id number]   Laboratory Results   Blood Tests     CBG Fasting:: 36m/dL

## 2010-11-17 NOTE — Letter (Signed)
Summary: [facility]   Imported By: Edmonia James 05/05/2010 14:03:42  _____________________________________________________________________  External Attachment:    Type:   Image     Comment:   External Document

## 2010-11-17 NOTE — Assessment & Plan Note (Signed)
Summary: Amanda [name]--SPOT ON HER BREAST/   Vital Signs:  [name] profile:   70 year old female Height:      63 inches Weight:      208 pounds Pulse rate:   67 / minute BP sitting:   136 / 64  (left arm) Cuff size:   large  Vitals Entered By: Geanie Kenning (December 22, 2009 3:04 PM) CC: spot on right breast   Primary Care Provider:  Loyal Gambler DO  CC:  spot on right breast.  History of Present Illness: Notice spot on her breast about 4 days. Noticed it after her shower. Hx of spider bite on her left leg and they look similar. Doing hot compresses and using neosporin on it.  No drainage. Painful to lays on that side or bumps it.  More burning sensation. No trauma or injury. Did have a staph infection on her left leg that was taken care of at the wound center for over 6 weeks.   Also having diarrhea but feels her diarrhea is better today. Just started the fluoxetine on Friday.   Also has some a rash under both breasts that have been there for overa month. Nontender. Says she doesn' really wear a bra.   Current Medications (verified): 1)  Pravastatin Sodium 40 Mg Tabs (Pravastatin Sodium) .... Take 1 Tab By Mouth At Bedtime 2)  Novolog Mix 70/30 Flexpen 70-30 % Susp (Insulin Aspart Prot & Aspart) .... 60 Units [place] Injection Two Times A Day, Take With Breakfast and Dinner 3)  Metoprolol Tartrate 50 Mg Tabs (Metoprolol Tartrate) .Marland Kitchen.. 1 Tab By Mouth Bid 4)  Triamcinolone 0.1% Cream: Eucerin 1:1 .... Apply To Psoriasis Two Times A Day X 10 Days 5)  Lisinopril-Hydrochlorothiazide 20-12.5 Mg Tabs (Lisinopril-Hydrochlorothiazide) .Marland Kitchen.. 1 Tab By Mouth Qam 6)  Janumet 50-500 Mg Tabs (Sitagliptin-Metformin Hcl) .Marland Kitchen.. 1 Tab By Mouth Once Daily With Dinner 7)  Aspir-Low 81 Mg Tbec (Aspirin) .Marland Kitchen.. 1 Tab By Mouth Daily 8)  Fluoxetine Hcl 10 Mg Caps (Fluoxetine Hcl) .Marland Kitchen.. 1 Capsule By Mouth Qam  Allergies (verified): No Known Drug Allergies  Comments:  Nurse/Medical Assistant: The [name]'s  medications and allergies were reviewed with the [name] and were updated in the Medication and Allergy Lists. Geanie Kenning (December 22, 2009 3:05 PM)  Physical Exam  General:  Well-developed,well-nourished,in no acute distress; alert,appropriate and cooperative throughout examination Breasts:  Uncer both breast she has damp skin that is a dusky pink color with a sharp border.  a Few satellite lesions. ON the latera right breast has a 1.5 cm erythematous area that has some clear driainage from the center. No significant induration.  Skin:  no rashes.   Psych:  Cognition and judgment appear intact. Alert and cooperative with normal attention span and concentration. No apparent delusions, illusions, hallucinations   Impression & Recommendations:  Problem # 1:  ABSCESS, SKIN (ICD-682.9) Wound culture sent as well.  Her updated medication list for this problem includes:    Sulfamethoxazole-tmp Ds 800-160 Mg Tabs (Sulfamethoxazole-trimethoprim) .Marland Kitchen... Take 1 tablet by mouth two times a day for 10 days  Orders: T-Culture, Wound ([id number])  Elevate affected area. Warm moist compresses for 20 minutes every 2 hours while awake. Take antibiotics as directed and take acetaminophen as needed. To be seen in 48-72 hours if no improvement, sooner if worse.  Problem # 2:  SKIN RASH (ICD-782.1) Area under brest possible fungal/yeast. The moisture is a problem since she has large breast. Trial of nystatin powder.  If no improvment after 2-3 weeks may need for of a topical lamisil products.  Consider erythrasma as well.  Her updated medication list for this problem includes:    Nystop 100000 Unit/gm Powd (Nystatin) .Marland Kitchen... Apply two times a day under both breasts.  Complete Medication List: 1)  Pravastatin Sodium 40 Mg Tabs (Pravastatin sodium) .... Take 1 tab by mouth at bedtime 2)  Novolog Mix 70/30 Flexpen 70-30 % Susp (Insulin aspart prot & aspart) .... 60 units [place] injection two times a day, take  with breakfast and dinner 3)  Metoprolol Tartrate 50 Mg Tabs (Metoprolol tartrate) .Marland Kitchen.. 1 tab by mouth bid 4)  Triamcinolone 0.1% Cream: Eucerin 1:1  .... Apply to psoriasis two times a day x 10 days 5)  Lisinopril-hydrochlorothiazide 20-12.5 Mg Tabs (Lisinopril-hydrochlorothiazide) .Marland Kitchen.. 1 tab by mouth qam 6)  Janumet 50-500 Mg Tabs (Sitagliptin-metformin hcl) .Marland Kitchen.. 1 tab by mouth once daily with dinner 7)  Aspir-low 81 Mg Tbec (Aspirin) .Marland Kitchen.. 1 tab by mouth daily 8)  Fluoxetine Hcl 10 Mg Caps (Fluoxetine hcl) .Marland Kitchen.. 1 capsule by mouth qam 9)  Sulfamethoxazole-tmp Ds 800-160 Mg Tabs (Sulfamethoxazole-trimethoprim) .... Take 1 tablet by mouth two times a day for 10 days 10)  Nystop 100000 Unit/gm Powd (Nystatin) .... Apply two times a day under both breasts. Prescriptions: NYSTOP 100000 UNIT/GM POWD (NYSTATIN) Apply two times a day under both breasts.  #1 bottle x 1   Entered and Authorized by:   Beatrice Lecher MD   Signed by:   Beatrice Lecher MD on 12/22/2009   Method used:   Electronically to        [facility] [phone]* (retail)       [address], [place]  [address]       Ph: [phone]       Fax: [phone]   RxID:   [id number] SULFAMETHOXAZOLE-TMP DS 800-160 MG TABS (SULFAMETHOXAZOLE-TRIMETHOPRIM) Take 1 tablet by mouth two times a day for 10 days  #20 x 0   Entered and Authorized by:   Beatrice Lecher MD   Signed by:   Beatrice Lecher MD on 12/22/2009   Method used:   Electronically to        [facility] [phone]* (retail)       [address]       [place], [place]  [address]       Ph: [phone]       Fax: [phone]   RxID:   [id number]

## 2010-11-17 NOTE — Miscellaneous (Signed)
Summary: old records  Clinical Lists Changes  Observations: Added new observation of PAST MED HX: HTN high cholesterol T2DM 2008 Depression obesity psoriasis  Dr Elease Hashimoto [facility] Dr Buford Dresser optho Colonoscopy due 2013 CT for pulm nodule 2010 (01/09/2010 15:40) Added new observation of PRIMARY MD: Loyal Gambler DO (01/09/2010 15:40) Added new observation of COLONNXTDUE: 11/19/2011 (01/09/2010 15:40) Added new observation of DB FT EX DUE: 12/15/2010 (01/09/2010 15:40) Added new observation of HGBA1CNXTDUE: 03/14/2010 (01/09/2010 15:40) Added new observation of LDL: 105 mg/dL (07/08/2009 15:40) Added new observation of HDL: 34 mg/dL (07/08/2009 15:40) Added new observation of TRIGLYC TOT: 338 mg/dL (07/08/2009 15:40) Added new observation of CHOLESTEROL: 207 mg/dL (07/08/2009 15:40) Added new observation of CALCIUM: 10 mg/dL (07/08/2009 15:40) Added new observation of ALBUMIN: 3.8 g/dL (07/08/2009 15:40) Added new observation of PROTEIN, TOT: 6.7 g/dL (07/08/2009 15:40) Added new observation of SGPT (ALT): 31 units/L (07/08/2009 15:40) Added new observation of SGOT (AST): 43 units/L (07/08/2009 15:40) Added new observation of ALK PHOS: 127 units/L (07/08/2009 15:40) Added new observation of BILI TOTAL: 0.8 mg/dL (07/08/2009 15:40) Added new observation of CREATININE: 0.6 mg/dL (07/08/2009 15:40) Added new observation of BUN: 9 mg/dL (07/08/2009 15:40) Added new observation of K SERUM: 4.3 meq/L (07/08/2009 15:40) Added new observation of NA: 134 meq/L (07/08/2009 15:40) Added new observation of GLU MON POC: 405 mg/dL (07/08/2009 15:40) Added new observation of MICROALB/CRE: 66.6 mg/g (07/08/2009 15:40) Added new observation of HGBA1C: 10.8 % (07/08/2009 15:40) Added new observation of LST COLON DT: 11/18/2006  (11/18/2006 15:40) Added new observation of COLONOSCOPY: abnormal  (11/18/2006 15:40)     Colonoscopy Result Date:  11/18/2006 Colonoscopy Result:   abnormal Colonoscopy Next Due:  5 yr     Past History:  Past Medical History: HTN high cholesterol T2DM 2008 Depression obesity psoriasis  Dr Elease Hashimoto [facility] Dr Buford Dresser optho Colonoscopy due 2013 CT for pulm nodule 2010

## 2010-11-17 NOTE — Assessment & Plan Note (Signed)
Summary: f/u DM   Vital Signs:  Patient profile:   70 year old female Height:      63 inches Weight:      204 pounds BMI:     36.27 O2 Sat:      94 % on Room air Pulse rate:   90 / minute BP sitting:   153 / 85  (left arm) Cuff size:   large  Vitals Entered By: Sherlean Foot CMA (July 10, 2010 10:35 AM)  O2 Flow:  Room air CC: F/U DM   Primary Care Somnang Mahan:  Loyal Gambler DO  CC:  F/U DM.  History of Present Illness: 70 yo WF presents for f/u T2DM.  Her A1C is down only 1 point to 10.5.  She was turned down for medication assistance.  She is on Novolog Mix 60 units twice a day and Janumet.  She is running 130s- 180s AM fasting.  She had one low at 57 at 3pm one day.  She had a late lunch that day.  She is trying to eat more regular meals and snacks now.  Her stress levels have affected her sugar levels too.  She and her husband are moving to [place] - off [address].  Shirlean Mylar and her son are in the same complex.  She plans to start walking more with the move to [facility] and more areas to walk.    Current Medications (verified): 1)  Pravastatin Sodium 40 Mg Tabs (Pravastatin Sodium) .... Take 1 Tab By Mouth At Bedtime 2)  Novolog Mix 70/30 Flexpen 70-30 % Susp (Insulin Aspart Prot & Aspart) .... 60 Units [place] Injection Two Times A Day, Take With Breakfast and Dinner 3)  Metoprolol Tartrate 100 Mg Tabs (Metoprolol Tartrate) .Marland Kitchen.. 1 Tab By Mouth Bid 4)  Triamcinolone 0.1% Cream: Eucerin 1:1 .... Apply To Psoriasis Two Times A Day X 10 Days 5)  Lisinopril-Hydrochlorothiazide 20-12.5 Mg Tabs (Lisinopril-Hydrochlorothiazide) .... 2  Tab By Mouth Qam 6)  Janumet 50-500 Mg Tabs (Sitagliptin-Metformin Hcl) .Marland Kitchen.. 1 Tab By Mouth Once Daily With Dinner 7)  Aspir-Low 81 Mg Tbec (Aspirin) .Marland Kitchen.. 1 Tab By Mouth Daily 8)  Fluoxetine Hcl 40 Mg Caps (Fluoxetine Hcl) .Marland Kitchen.. 1 Capsule By Mouth Qam 9)  Hydroxyurea 500 Mg Caps (Hydroxyurea) .... Take 1 Cap By Mouth Once Daily As Directed 10)   Ketoconazole 2 % Crea (Ketoconazole) .... Apply Two Times A Day 11)  Gabapentin 300 Mg Caps (Gabapentin) .Marland Kitchen.. 1 Capsule By Mouth At Bedtime X 3 Days Then Increase To 2 Capsules By Mouth Qhs  Allergies (verified): No Known Drug Allergies  Past History:  Past Medical History: Reviewed history from 01/12/2010 and no changes required. HTN high cholesterol T2DM 2008 Depression obesity psoriasis polycythemia  Dr Elease Hashimoto [facility] hematology Dr Buford Dresser optho Colonoscopy due 2013 CT for pulm nodule 2010  Past Surgical History: Reviewed history from 11/24/2009 and no changes required. breast surgery - milk duct TAH with BSO for heavy periods   Family History: Reviewed history from 11/24/2009 and no changes required. Mother died at 30, DM, ESRD, CAD, HTN, high chol Father died of ESRD at 56 sister healthy aunt breast cancer  Social History: Reviewed history from 11/24/2009 and no changes required. Married, son and daughter in [place]. 2 grandsons. Quit smoking 1993 Alcohol use-no Drug use-no No exercise . 6 cups of coffee daily  Review of Systems      See HPI  Physical Exam  General:  alert, well-developed, well-nourished, and well-hydrated.   Head:  normocephalic and atraumatic.   Eyes:  pupils equal, pupils round, and pupils reactive to light.   Mouth:  pharynx pink and moist.   Neck:  no masses.   Lungs:  Clear to auscultation bilaterally with no crackles, wheezes, rales or rhonchi.  Heart:  normal rate, regular rhythm, no murmur, no gallop, and no rub.   Pulses:  2+ radial and pedal pulses Skin:  herpes rash on L hand has resolved Psych:  good eye contact, not anxious appearing, and not depressed appearing.     Impression & Recommendations:  Problem # 1:  DIABETES MELLITUS, TYPE II (ICD-250.00) A1C is 10.5 today, down only 1 point, still not at goal.  PNX and flu vaccine today. Unable to adjust up her Novolog mix because she is not eating regular meals and has  had a low reading. She needs to work on her diabetic diet and eating regular meals, exercise and more routine monitoring of her sugars at home. She will schedule her diabetic eye exam. Her updated medication list for this problem includes:    Novolog Mix 70/30 Flexpen 70-30 % Susp (Insulin aspart prot & aspart) .Marland KitchenMarland KitchenMarland KitchenMarland Kitchen 60 units [place] injection two times a day, take with breakfast and dinner    Lisinopril-hydrochlorothiazide 20-12.5 Mg Tabs (Lisinopril-hydrochlorothiazide) .Marland Kitchen... 2  tab by mouth qam    Janumet 50-500 Mg Tabs (Sitagliptin-metformin hcl) .Marland Kitchen... 1 tab by mouth once daily with dinner    Aspir-low 81 Mg Tbec (Aspirin) .Marland Kitchen... 1 tab by mouth daily  Orders: Fingerstick (36416) Hgb A1C (84696EX)  Problem # 2:  ESSENTIAL HYPERTENSION, BENIGN (ICD-401.1) BP high today but previously at goal.  Will continue her current meds. Her updated medication list for this problem includes:    Metoprolol Tartrate 100 Mg Tabs (Metoprolol tartrate) .Marland Kitchen... 1 tab by mouth bid    Lisinopril-hydrochlorothiazide 20-12.5 Mg Tabs (Lisinopril-hydrochlorothiazide) .Marland Kitchen... 2  tab by mouth qam  BP today: 153/85 Prior BP: 120/72 (06/30/2010)  Labs Reviewed: K+: 4.5 (06/02/2010) Creat: : 0.77 (06/02/2010)   Chol: 207 (07/08/2009)   HDL: 34 (07/08/2009)   LDL: 105 (07/08/2009)   TG: 338 (07/08/2009)  Complete Medication List: 1)  Pravastatin Sodium 40 Mg Tabs (Pravastatin sodium) .... Take 1 tab by mouth at bedtime 2)  Novolog Mix 70/30 Flexpen 70-30 % Susp (Insulin aspart prot & aspart) .... 60 units [place] injection two times a day, take with breakfast and dinner 3)  Metoprolol Tartrate 100 Mg Tabs (Metoprolol tartrate) .Marland Kitchen.. 1 tab by mouth bid 4)  Triamcinolone 0.1% Cream: Eucerin 1:1  .... Apply to psoriasis two times a day x 10 days 5)  Lisinopril-hydrochlorothiazide 20-12.5 Mg Tabs (Lisinopril-hydrochlorothiazide) .... 2  tab by mouth qam 6)  Janumet 50-500 Mg Tabs (Sitagliptin-metformin hcl) .Marland Kitchen.. 1 tab by mouth once  daily with dinner 7)  Aspir-low 81 Mg Tbec (Aspirin) .Marland Kitchen.. 1 tab by mouth daily 8)  Fluoxetine Hcl 40 Mg Caps (Fluoxetine hcl) .Marland Kitchen.. 1 capsule by mouth qam 9)  Hydroxyurea 500 Mg Caps (Hydroxyurea) .... Take 1 cap by mouth once daily as directed 10)  Ketoconazole 2 % Crea (Ketoconazole) .... Apply two times a day 11)  Gabapentin 300 Mg Caps (Gabapentin) .Marland Kitchen.. 1 capsule by mouth at bedtime x 3 days then increase to 2 capsules by mouth qhs 12)  Zostavax 19400 Unt/0.61m Solr (Zoster vaccine live) .... Administer x 1  Other Orders: Flu Vaccine 37yr+ MEDICARE PATIENTS (Q(B2841Administration Flu vaccine - MCR (G0008) Pneumococcal Vaccine (9(32440Admin 1st Vaccine ([id number] 1st  Vaccine [facility]) [phone])  Patient Instructions: 1)  Stay on current meds. 2)  Your BP was high today but perfect last visit. 3)  Will get your diabetic eye exam set up. 4)  A1C 10.5 today (7.0 is goal). 5)  Work on diabetic diet, eating regular meals + regular physical activity. 6)  AM fasting goal is 90-120. 7)  2 hr after dinner goal is <150. 8)  Return for follow up in 3-4 mos. Prescriptions: ZOSTAVAX 29476 UNT/0.65ML SOLR (ZOSTER VACCINE LIVE) administer x 1  #1 x 0   Entered and Authorized by:   Loyal Gambler DO   Signed by:   Loyal Gambler DO on 07/10/2010   Method used:   Printed then faxed to ...       [facility]  [address] [phone]* (retail)       [address], [place]  [address]       Ph: [phone]       Fax: [phone]   RxID:   [id number]  Flu Vaccine Consent Questions     Do you have a history of severe allergic reactions to this vaccine? no    Any prior history of allergic reactions to egg and/or gelatin? no    Do you have a sensitivity to the preservative Thimersol? no    Do you have a past history of Guillan-Barre Syndrome? no    Do you currently have an acute febrile illness? no    Have you ever had a severe reaction to latex? no    Vaccine information given and explained to  patient? yes    Are you currently pregnant? no    Lot Number:[id number]   Exp Date:04/17/2011   Site Given  Left Deltoid IMmedflu   Pneumovax Vaccine    Vaccine Type: Pneumovax (Medicare)    Site: right deltoid    Dose: 0.5 ml    Route: IM    Given by: Sherlean Foot CMA    VIS given: 09/22/09 version given July 10, 2010.   Laboratory Results   Blood Tests     HGBA1C: 10.5%   (Normal Range: Non-Diabetic - 3-6%   Control Diabetic - 6-8%)     Appended Document: f/u DM

## 2010-11-17 NOTE — Consult Note (Signed)
Summary: [facility]   Imported By: Edmonia James 06/09/2010 12:51:46  _____________________________________________________________________  External Attachment:    Type:   Image     Comment:   External Document

## 2010-11-17 NOTE — Letter (Signed)
Summary: [facility] No [facility]  Patient No Show/[facility]   Imported By: Edmonia James 06/12/2010 12:46:16  _____________________________________________________________________  External Attachment:    Type:   Image     Comment:   External Document

## 2010-11-17 NOTE — Consult Note (Signed)
Summary: [facility]   Imported By: Edmonia James 04/09/2010 14:15:17  _____________________________________________________________________  External Attachment:    Type:   Image     Comment:   External Document

## 2010-11-17 NOTE — Assessment & Plan Note (Signed)
Summary: f/u high sugars/ HTN   Vital Signs:  Patient profile:   70 year old female Height:      63 inches Weight:      202 pounds BMI:     35.91 O2 Sat:      95 % on Room air Pulse rate:   78 / minute BP sitting:   152 / 79  (left arm) Cuff size:   large  Vitals Entered By: Sherlean Foot CMA (May 07, 2010 9:01 AM)  O2 Flow:  Room air CC: F/U BP. Went to ED last week for sugar >600.    Primary Care Provider:  Loyal Gambler DO  CC:  F/U BP. Went to ED last week for sugar >600. Marland Kitchen  History of Present Illness: 70 yo WF presents for 4 wk f/u.  She felt bad about 8 days ago and her sugar was 640.  She had been taking her meds.  She admits to 'binging on Pepsi'. She was not admitted, rather spent a few hrs in the [facility] ED and recieved IV fluids and her sugar was dropped down to 289.    She was diagnosed with a UTI but was already on abx.   She is due to recheck her urine today since she had completed abx.    she did not take her BP meds this morning.  She says that she has been more compliant with her insulin and Janumet recently and that her mood has improved with higher dose of Fluoxetine.    Current Medications (verified): 1)  Pravastatin Sodium 40 Mg Tabs (Pravastatin Sodium) .... Take 1 Tab By Mouth At Bedtime 2)  Novolog Mix 70/30 Flexpen 70-30 % Susp (Insulin Aspart Prot & Aspart) .... 60 Units [place] Injection Two Times A Day, Take With Breakfast and Dinner 3)  Metoprolol Tartrate 100 Mg Tabs (Metoprolol Tartrate) .Marland Kitchen.. 1 Tab By Mouth Bid 4)  Triamcinolone 0.1% Cream: Eucerin 1:1 .... Apply To Psoriasis Two Times A Day X 10 Days 5)  Lisinopril-Hydrochlorothiazide 20-12.5 Mg Tabs (Lisinopril-Hydrochlorothiazide) .... 2  Tab By Mouth Qam 6)  Janumet 50-500 Mg Tabs (Sitagliptin-Metformin Hcl) .Marland Kitchen.. 1 Tab By Mouth Once Daily With Dinner 7)  Aspir-Low 81 Mg Tbec (Aspirin) .Marland Kitchen.. 1 Tab By Mouth Daily 8)  Fluoxetine Hcl 40 Mg Caps (Fluoxetine Hcl) .Marland Kitchen.. 1 Capsule By Mouth Qam 9)  Hydroxyurea  500 Mg Caps (Hydroxyurea) .... Take 1 Cap By Mouth Once Daily As Directed 10)  Ketoconazole 2 % Crea (Ketoconazole) .... Apply Two Times A Day 11)  Gabapentin 300 Mg Caps (Gabapentin) .Marland Kitchen.. 1 Capsule By Mouth At Bedtime X 3 Days Then Increase To 2 Capsules By Mouth Qhs  Allergies (verified): No Known Drug Allergies  Past History:  Past Medical History: Reviewed history from 01/12/2010 and no changes required. HTN high cholesterol T2DM 2008 Depression obesity psoriasis polycythemia  Dr Elease Hashimoto [facility] hematology Dr Buford Dresser optho Colonoscopy due 2013 CT for pulm nodule 2010  Past Surgical History: Reviewed history from 11/24/2009 and no changes required. breast surgery - milk duct TAH with BSO for heavy periods   Family History: Reviewed history from 11/24/2009 and no changes required. Mother died at 105, DM, ESRD, CAD, HTN, high chol Father died of ESRD at 12 sister healthy aunt breast cancer  Social History: Reviewed history from 11/24/2009 and no changes required. Married, son and daughter in [place]. 2 grandsons. Quit smoking 1993 Alcohol use-no Drug use-no No exercise . 6 cups of coffee daily  Review of Systems  See HPI  Physical Exam  General:  obese WF in NAD Head:  normocephalic and atraumatic.   Mouth:  pharynx pink and moist.   Neck:  no masses.   Lungs:  normal respiratory effort, no intercostal retractions, no accessory muscle use, and normal breath sounds.   Heart:  normal rate, regular rhythm, and no murmur.   Extremities:  no E/C/C Skin:  R antecubital bruising,  healed abscess under R side of abdominal panus.  Cervical Nodes:  No lymphadenopathy noted Psych:  good eye contact, not anxious appearing, and not depressed appearing.     Impression & Recommendations:  Problem # 1:  HYPERGLYCEMIA (ICD-790.29) Feeling better.  Secondary to Pepsi binge/ UTI.  UA is clear today.  Avoiding soda for the most part.  Check BMP today for f/u. Her  updated medication list for this problem includes:    Novolog Mix 70/30 Flexpen 70-30 % Susp (Insulin aspart prot & aspart) .Marland KitchenMarland KitchenMarland KitchenMarland Kitchen 60 units [place] injection two times a day, take with breakfast and dinner    Janumet 50-500 Mg Tabs (Sitagliptin-metformin hcl) .Marland Kitchen... 1 tab by mouth once daily with dinner  Orders: T-Basic Metabolic Panel (82800-34917) UA Dipstick w/o Micro (automated)  (81003)  Problem # 2:  ESSENTIAL HYPERTENSION, BENIGN (ICD-401.1) BP high today but she did not take her meds yet.  REminded pt of the importance of taking all of her meds as directed.  Will get her back in with the Pharm D to review her meds and times of administration. Her updated medication list for this problem includes:    Metoprolol Tartrate 100 Mg Tabs (Metoprolol tartrate) .Marland Kitchen... 1 tab by mouth bid    Lisinopril-hydrochlorothiazide 20-12.5 Mg Tabs (Lisinopril-hydrochlorothiazide) .Marland Kitchen... 2  tab by mouth qam  BP today: 152/79 Prior BP: 156/87 (04/07/2010)  Labs Reviewed: K+: 4.3 (07/08/2009) Creat: : 0.6 (07/08/2009)   Chol: 207 (07/08/2009)   HDL: 34 (07/08/2009)   LDL: 105 (07/08/2009)   TG: 338 (07/08/2009)  Problem # 3:  DEPRESSION, SEVERE (ICD-311) Improved with Fluoxetine 40 mg/ day.  Continue and check a PHQ-9 next visit. Her updated medication list for this problem includes:    Fluoxetine Hcl 40 Mg Caps (Fluoxetine hcl) .Marland Kitchen... 1 capsule by mouth qam  Problem # 4:  DIABETES MELLITUS, TYPE II (ICD-250.00) A1C not yet due.  Reminded pt of the importance of dietary adherence as a diabetic, just as important as medication adherence.  It appears that her sugar of 600 was related to her 'binging' on Pepsi. Her updated medication list for this problem includes:    Novolog Mix 70/30 Flexpen 70-30 % Susp (Insulin aspart prot & aspart) .Marland KitchenMarland KitchenMarland KitchenMarland Kitchen 60 units [place] injection two times a day, take with breakfast and dinner    Lisinopril-hydrochlorothiazide 20-12.5 Mg Tabs (Lisinopril-hydrochlorothiazide) .Marland Kitchen... 2  tab by mouth  qam    Janumet 50-500 Mg Tabs (Sitagliptin-metformin hcl) .Marland Kitchen... 1 tab by mouth once daily with dinner    Aspir-low 81 Mg Tbec (Aspirin) .Marland Kitchen... 1 tab by mouth daily  Labs Reviewed: Creat: 0.6 (07/08/2009)    Reviewed HgBA1c results: 11.5 (04/07/2010)  9.6 (12/15/2009)  Complete Medication List: 1)  Pravastatin Sodium 40 Mg Tabs (Pravastatin sodium) .... Take 1 tab by mouth at bedtime 2)  Novolog Mix 70/30 Flexpen 70-30 % Susp (Insulin aspart prot & aspart) .... 60 units [place] injection two times a day, take with breakfast and dinner 3)  Metoprolol Tartrate 100 Mg Tabs (Metoprolol tartrate) .Marland Kitchen.. 1 tab by mouth bid 4)  Triamcinolone 0.1%  Cream: Eucerin 1:1  .... Apply to psoriasis two times a day x 10 days 5)  Lisinopril-hydrochlorothiazide 20-12.5 Mg Tabs (Lisinopril-hydrochlorothiazide) .... 2  tab by mouth qam 6)  Janumet 50-500 Mg Tabs (Sitagliptin-metformin hcl) .Marland Kitchen.. 1 tab by mouth once daily with dinner 7)  Aspir-low 81 Mg Tbec (Aspirin) .Marland Kitchen.. 1 tab by mouth daily 8)  Fluoxetine Hcl 40 Mg Caps (Fluoxetine hcl) .Marland Kitchen.. 1 capsule by mouth qam 9)  Hydroxyurea 500 Mg Caps (Hydroxyurea) .... Take 1 cap by mouth once daily as directed 10)  Ketoconazole 2 % Crea (Ketoconazole) .... Apply two times a day 11)  Gabapentin 300 Mg Caps (Gabapentin) .Marland Kitchen.. 1 capsule by mouth at bedtime x 3 days then increase to 2 capsules by mouth qhs  Patient Instructions: 1)  Urine is clear of infection. 2)  I will get you back in with Dr Cyndia Bent, Sherian Rein D on Monday. 3)  She will call to set this up with you. 4)  Remember to stay on all of your meds as directed. 5)  BMP today.  Will call you w/ results tomorrow. 6)  REturn to see me in 2 mos for f/u Diabetes/ HTN.  Laboratory Results   Urine Tests    Routine Urinalysis   Color: yellow Appearance: Clear Glucose: >=1000   (Normal Range: Negative) Bilirubin: negative   (Normal Range: Negative) Ketone: negative   (Normal Range: Negative) Spec. Gravity: 1.025    (Normal Range: 1.003-1.035) Blood: large   (Normal Range: Negative) pH: 5.0   (Normal Range: 5.0-8.0) Protein: negative   (Normal Range: Negative) Urobilinogen: 0.2   (Normal Range: 0-1) Nitrite: negative   (Normal Range: Negative) Leukocyte Esterace: negative   (Normal Range: Negative)

## 2010-11-17 NOTE — Assessment & Plan Note (Signed)
Summary: f/u sugars/ BP/ intertrigo   Vital Signs:  Patient profile:   70 year old female Height:      63 inches Weight:      207 pounds BMI:     36.80 O2 Sat:      96 % on Room air Pulse rate:   76 / minute BP sitting:   157 / 79  (left arm) Cuff size:   large  Vitals Entered By: Sherlean Foot CMA (Feb 17, 2010 9:02 AM)  O2 Flow:  Room air CC: F/U.    Primary Care Provider:  Loyal Gambler DO  CC:  F/U. Marland Kitchen  History of Present Illness: 70 yo WF presents for f/u diabetes and breast cellulitis.  She is on Novolog 70/30 60 units 2 x a day but admits to some non adherence.  She has a poor diet and is not following a diabetic diet.  She is not exercising.  She is on Janumet 1 x a day.  Her sugars are ranging 150 to low 300s.  She started walking and doing chair exercises but she is having pain in her legs.  This is a burning type pain.  She has not previously been diagnosed with neuropathy.  Her redness on the lateral R breast has improved where she had the I&D of an abscess but it has scared.  She has redness under the breast which remain large and pendulous.  She has a problem with sweatign and has failed to improve with nystatin cream or powder.  She started exercising but claims that she gets out of breathe. She is a former smoker.  Denies any chest pain.  She is only able to walk 10 min on the treadmill before her daughter says that she just gives up.    Current Medications (verified): 1)  Pravastatin Sodium 40 Mg Tabs (Pravastatin Sodium) .... Take 1 Tab By Mouth At Bedtime 2)  Novolog Mix 70/30 Flexpen 70-30 % Susp (Insulin Aspart Prot & Aspart) .... 60 Units [place] Injection Two Times A Day, Take With Breakfast and Dinner 3)  Metoprolol Tartrate 100 Mg Tabs (Metoprolol Tartrate) .Marland Kitchen.. 1 Tab By Mouth Bid 4)  Triamcinolone 0.1% Cream: Eucerin 1:1 .... Apply To Psoriasis Two Times A Day X 10 Days 5)  Lisinopril-Hydrochlorothiazide 20-12.5 Mg Tabs (Lisinopril-Hydrochlorothiazide) .Marland Kitchen.. 1  Tab By Mouth Qam 6)  Janumet 50-500 Mg Tabs (Sitagliptin-Metformin Hcl) .Marland Kitchen.. 1 Tab By Mouth Once Daily With Dinner 7)  Aspir-Low 81 Mg Tbec (Aspirin) .Marland Kitchen.. 1 Tab By Mouth Daily 8)  Fluoxetine Hcl 20 Mg Caps (Fluoxetine Hcl) .Marland Kitchen.. 1 Capsule By Mouth Once Daily 9)  Nystop 100000 Unit/gm Powd (Nystatin) .... Apply Two Times A Day Under Both Breasts. 10)  Hydroxyurea 500 Mg Caps (Hydroxyurea) .... Take 1 Cap By Mouth Once Daily As Directed  Allergies (verified): No Known Drug Allergies  Past History:  Past Medical History: Reviewed history from 01/12/2010 and no changes required. HTN high cholesterol T2DM 2008 Depression obesity psoriasis polycythemia  Dr Elease Hashimoto [facility] hematology Dr Buford Dresser optho Colonoscopy due 2013 CT for pulm nodule 2010  Past Surgical History: Reviewed history from 11/24/2009 and no changes required. breast surgery - milk duct TAH with BSO for heavy periods   Social History: Reviewed history from 11/24/2009 and no changes required. Married, son and daughter in [place]. 2 grandsons. Quit smoking 1993 Alcohol use-no Drug use-no No exercise . 6 cups of coffee daily  Review of Systems      See HPI  Physical  Exam  General:  obese WF in NAD Head:  normocephalic and atraumatic.   Mouth:  pharynx pink and moist.   Neck:  no masses.   Lungs:  normal respiratory effort, no intercostal retractions, no accessory muscle use, and normal breath sounds.   Heart:  normal rate, regular rhythm, and no murmur.   Msk:  no joint tenderness, no joint swelling, no joint warmth, and no redness over joints.   Extremities:  no LE edema Skin:  scar 1.5 cm with hyerpigmentation over the lateral R breast.  Mild intertigo rash under both breasts. psoriatic plaques over arms and legs. Psych:  depressed affect.     Impression & Recommendations:  Problem # 1:  DIABETES MELLITUS, TYPE II (ICD-250.00) With possible neuropathic pain in her legs.  For now, I'd like for her to  work on improving her diet and actually taking her meds before starting neurontin to doing a full w/u for neuropathy. Her updated medication list for this problem includes:    Novolog Mix 70/30 Flexpen 70-30 % Susp (Insulin aspart prot & aspart) .Marland KitchenMarland KitchenMarland KitchenMarland Kitchen 60 units [place] injection two times a day, take with breakfast and dinner    Lisinopril-hydrochlorothiazide 20-12.5 Mg Tabs (Lisinopril-hydrochlorothiazide) .Marland Kitchen... 1 tab by mouth qam    Janumet 50-500 Mg Tabs (Sitagliptin-metformin hcl) .Marland Kitchen... 1 tab by mouth once daily with dinner    Aspir-low 81 Mg Tbec (Aspirin) .Marland Kitchen... 1 tab by mouth daily  Orders: Fingerstick (56433) Glucose, (CBG) (29518)  Problem # 2:  SKIN RASH (ICD-782.1) Intertrigo type rash that has failed to resolve due to continued sweating and maceration from large pendulous breasts.  She has tried air- drying this area but has failed to try Desitin which I recommended.   Her updated medication list for this problem includes:    Nystop 100000 Unit/gm Powd (Nystatin) .Marland Kitchen... Apply two times a day under both breasts.  Problem # 3:  DYSPNEA (ICD-786.05) SOB with exertion is likely to be deconditioning given her weight and lack of exercise. She is not having any chest pain.  PFs done in the yellow zone so will try her on a rescue Albuterol inhaler with a spacer.  Complete Medication List: 1)  Pravastatin Sodium 40 Mg Tabs (Pravastatin sodium) .... Take 1 tab by mouth at bedtime 2)  Novolog Mix 70/30 Flexpen 70-30 % Susp (Insulin aspart prot & aspart) .... 60 units [place] injection two times a day, take with breakfast and dinner 3)  Metoprolol Tartrate 100 Mg Tabs (Metoprolol tartrate) .Marland Kitchen.. 1 tab by mouth bid 4)  Triamcinolone 0.1% Cream: Eucerin 1:1  .... Apply to psoriasis two times a day x 10 days 5)  Lisinopril-hydrochlorothiazide 20-12.5 Mg Tabs (Lisinopril-hydrochlorothiazide) .Marland Kitchen.. 1 tab by mouth qam 6)  Janumet 50-500 Mg Tabs (Sitagliptin-metformin hcl) .Marland Kitchen.. 1 tab by mouth once daily with  dinner 7)  Aspir-low 81 Mg Tbec (Aspirin) .Marland Kitchen.. 1 tab by mouth daily 8)  Fluoxetine Hcl 20 Mg Caps (Fluoxetine hcl) .Marland Kitchen.. 1 capsule by mouth once daily 9)  Nystop 100000 Unit/gm Powd (Nystatin) .... Apply two times a day under both breasts. 10)  Hydroxyurea 500 Mg Caps (Hydroxyurea) .... Take 1 cap by mouth once daily as directed 11)  Proair Hfa 108 (90 Base) Mcg/act Aers (Albuterol sulfate) .... 2 puffs q 4-6 hrs prn 12)  Aerochamber W/flowsignal Misc (Spacer/aero-holding chambers) .... Use as directed with inhaler  Patient Instructions: 1)  Use DESITIN (diaper cream) for rash under breasts after cleaning with soap and water and fully drying  the area.   2)  Update mammogram in June. 3)  Take your insulin and Janumet for diabetes. 4)  Stick to your diabetic diet and work on increasing exercise. 5)  AM fasting sugar goal is 90-120.  2 hrs after dinner goal is <150. 6)  Use Inhaler with spacer 2 puffs 20 min before exercise and AS NEEDED for shortness of breathe or wheezing. 7)  Return for f/u diabetes/ BP in 6 wks. Prescriptions: AEROCHAMBER W/FLOWSIGNAL  MISC (SPACER/AERO-HOLDING CHAMBERS) use as directed with inhaler  #1 x 0   Entered and Authorized by:   Loyal Gambler DO   Signed by:   Loyal Gambler DO on 02/17/2010   Method used:   Electronically to        [facility] [phone]* (retail)       [address], [place]  [address]       Ph: [phone]       Fax: [phone]   RxID:   [id number] PROAIR HFA 108 (90 BASE) MCG/ACT AERS (ALBUTEROL SULFATE) 2 puffs q 4-6 hrs prn  #1 x 0   Entered and Authorized by:   Loyal Gambler DO   Signed by:   Loyal Gambler DO on 02/17/2010   Method used:   Electronically to        [facility] [phone]* (retail)       [address]       [place], [place]  [address]       Ph: [phone]       Fax: [phone]   RxID:   [id number]   Laboratory Results   Blood Tests     CBG Fasting:: 474m/dL

## 2010-11-17 NOTE — Assessment & Plan Note (Signed)
Summary: herpetic whitlow   Vital Signs:  Patient profile:   70 year old female Height:      63 inches Weight:      204 pounds BMI:     36.27 O2 Sat:      97 % on Room air Pulse rate:   60 / minute BP sitting:   120 / 72  (left arm) Cuff size:   large  Vitals Entered By: Sherlean Foot CMA (June 30, 2010 11:06 AM)  O2 Flow:  Room air CC: herpes on L hand   Primary Care Provider:  Loyal Gambler DO  CC:  herpes on L hand.  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  Today she presents with a rash and blister on the palm of her left hand that began this past Saturday that has been painful but not itchy.  She reports that it began after she did a lot of heavy cleaning and did not use gloves.  It began with swelling on the dorsal aspect of her hand and asending pain up the medial aspect of her arm to her left armpit and then the lesion appeared on the palm of her hand.  Neosporin has not been helpful in clearing the lesion as it usually is for the patient.  She reports similar episodes that happended up until her last flare of this 5 years ago with a similar presentation.    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  She denies any blisters in the groin area or around the mouth, fevers, arthalgas or headaches.   Current Medications (verified): 1)  Pravastatin Sodium 40 Mg Tabs (Pravastatin Sodium) .... Take 1 Tab By Mouth At Bedtime 2)  Novolog Mix 70/30 Flexpen 70-30 % Susp (Insulin Aspart Prot & Aspart) .... 60 Units [place] Injection Two Times A Day, Take With Breakfast and Dinner 3)  Metoprolol Tartrate 100 Mg Tabs (Metoprolol Tartrate) .Marland Kitchen.. 1 Tab By Mouth Bid 4)  Triamcinolone 0.1% Cream: Eucerin 1:1 .... Apply To Psoriasis Two Times A Day X 10 Days 5)  Lisinopril-Hydrochlorothiazide 20-12.5 Mg Tabs (Lisinopril-Hydrochlorothiazide) .... 2  Tab By Mouth  Qam 6)  Janumet 50-500 Mg Tabs (Sitagliptin-Metformin Hcl) .Marland Kitchen.. 1 Tab By Mouth Once Daily With Dinner 7)  Aspir-Low 81 Mg Tbec (Aspirin) .Marland Kitchen.. 1 Tab By Mouth Daily 8)  Fluoxetine Hcl 40 Mg Caps (Fluoxetine Hcl) .Marland Kitchen.. 1 Capsule By Mouth Qam 9)  Hydroxyurea 500 Mg Caps (Hydroxyurea) .... Take 1 Cap By Mouth Once Daily As Directed 10)  Ketoconazole 2 % Crea (Ketoconazole) .... Apply Two Times A Day 11)  Gabapentin 300 Mg Caps (Gabapentin) .Marland Kitchen.. 1 Capsule By Mouth At Bedtime X 3 Days Then Increase To 2 Capsules By Mouth Qhs  Allergies (verified): No Known Drug Allergies  Past History:  Past Medical History: Reviewed history from 01/12/2010 and no changes required. HTN high cholesterol T2DM 2008 Depression obesity psoriasis polycythemia  Dr Elease Hashimoto [facility] hematology Dr Buford Dresser optho Colonoscopy due 2013 CT for pulm nodule 2010  Social History: Reviewed history from 11/24/2009 and no changes required. Married, son and daughter in [place]. 2 grandsons. Quit smoking 1993 Alcohol use-no Drug use-no No exercise . 6 cups of coffee daily  Review of Systems      See HPI  Physical Exam  General:  alert and well-developed.   Head:  normocephalic and  atraumatic.   Mouth:  no lesions of the oral mucosa Neck:  supple and no cervical lymphadenopathy.   Lungs:  Clear to auscultation bilaterally with no crackles, wheezes, rales or rhonchi.  Heart:  normal rate, regular rhythm, no murmur, no gallop, and no rub.   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 Treat with 10 days of oral acyclovir.  Keep covered to prevent spread to others. Use topical Hydrocortisone cream for itching. Tylenol #3 given as needed pain. Expect resolution in 7-10 days.  Call if any problems.  Complete Medication  List: 1)  Pravastatin Sodium 40 Mg Tabs (Pravastatin sodium) .... Take 1 tab by mouth at bedtime 2)  Novolog Mix 70/30 Flexpen 70-30 % Susp (Insulin aspart prot & aspart) .... 60 units [place] injection two times a day, take with breakfast and dinner 3)  Metoprolol Tartrate 100 Mg Tabs (Metoprolol tartrate) .Marland Kitchen.. 1 tab by mouth bid 4)  Triamcinolone 0.1% Cream: Eucerin 1:1  .... Apply to psoriasis two times a day x 10 days 5)  Lisinopril-hydrochlorothiazide 20-12.5 Mg Tabs (Lisinopril-hydrochlorothiazide) .... 2  tab by mouth qam 6)  Janumet 50-500 Mg Tabs (Sitagliptin-metformin hcl) .Marland Kitchen.. 1 tab by mouth once daily with dinner 7)  Aspir-low 81 Mg Tbec (Aspirin) .Marland Kitchen.. 1 tab by mouth daily 8)  Fluoxetine Hcl 40 Mg Caps (Fluoxetine hcl) .Marland Kitchen.. 1 capsule by mouth qam 9)  Hydroxyurea 500 Mg Caps (Hydroxyurea) .... Take 1 cap by mouth once daily as directed 10)  Ketoconazole 2 % Crea (Ketoconazole) .... Apply two times a day 11)  Gabapentin 300 Mg Caps (Gabapentin) .Marland Kitchen.. 1 capsule by mouth at bedtime x 3 days then increase to 2 capsules by mouth qhs 12)  Acyclovir 400 Mg Tabs (Acyclovir) .Marland Kitchen.. 1 tab by mouth three times a day x 10 days 13)  Tylenol With Codeine #3 300-30 Mg Tabs (Acetaminophen-codeine) .Marland Kitchen.. 1- 2 tabs by mouth three times a day as needed severe pain; take with food  Patient Instructions: 1)  Take 10 days of Acyclovir. 2)  Keep blisters covered unitl well scabbed over. 3)  Use topical Hydrocortisone cream as needed for itching. 4)  Use Tylenol #3 as needed for pain.  5)  Call if not cleared in 7-10 days. Prescriptions: TYLENOL WITH CODEINE #3 300-30 MG TABS (ACETAMINOPHEN-CODEINE) 1- 2 tabs by mouth three times a day as needed severe pain; take with food  #20 x 0   Entered and Authorized by:   Loyal Gambler DO   Signed by:   Loyal Gambler DO on 06/30/2010   Method used:   Printed then faxed to ...       [facility]  [address] [phone]* (retail)       [address], [place]  [address]        Ph: [phone]       Fax: [phone]   RxID:   [id number] ACYCLOVIR 400 MG TABS (ACYCLOVIR) 1 tab by mouth three times a day x 10 days  #30 x 0   Entered and Authorized by:   Loyal Gambler DO   Signed by:   Loyal Gambler DO on 06/30/2010   Method used:   Electronically to        [id number]* (retail)       [address]       [place], [place]  [address]       Ph: [phone]       Fax: [phone]   RxID:   [id number]

## 2010-11-17 NOTE — Consult Note (Signed)
Summary: [facility]   Imported By: Edmonia James 03/12/2010 12:45:53  _____________________________________________________________________  External Attachment:    Type:   Image     Comment:   External Document

## 2010-11-19 ENCOUNTER — Encounter: Payer: Self-pay | Admitting: Family Medicine

## 2010-11-19 NOTE — Progress Notes (Signed)
Summary: Novolog sample  Phone Note Call from Patient Call back at [phone]   Caller: Spouse-Bob Summary of Call: pt requests sample of Novolog 70/30.  Per [facility] sample held and OK for pt to come pick up today.  [name] notified. Initial call taken by: Abelino Derrick CMA Deborra Medina),  November 12, 2010 11:33 AM

## 2010-11-25 NOTE — Assessment & Plan Note (Signed)
Summary: f/u DM   Vital Signs:  Patient profile:   70 year old female Height:      63 inches Weight:      196 pounds BMI:     34.85 O2 Sat:      94 % on Room air Pulse rate:   87 / minute BP sitting:   183 / 96  (left arm) Cuff size:   large  Vitals Entered By: Sherlean Foot CMA (November 16, 2010 9:41 AM)  O2 Flow:  Room air CC: F/U DM   Primary Care Provider:  Loyal Gambler DO  CC:  F/U DM.  History of Present Illness: 70 yo WF presents for f/u T2DM.  Her A1C is up to 12.8 from 10.  She has strugged to pay for her insulin but recently got [employer].  She has R ear pain with 'fluid' feeling.  No problems hearing.  She has dysequilbrium x 2 wks.  She went to the ED and was given Meclizine and Acetic Acid 2% solution which is not helping.  Her AM fastings are running 180s with higher readings at night.  She is walking more.  She is trying to adhere to her diet but admits to 'cheating'.  She did have her eye exam recently.    She reports being 'dizzy' x 2 wks now with R ear fullness.  She went to the ED as stated above.  She did hit her head but this was a few days after the dizziness started.  No true rotational vertigo, trouble seeing or HAs.      Allergies (verified): 1)  ! Doxycycline  Past History:  Past Medical History: Reviewed history from 01/12/2010 and no changes required. HTN high cholesterol T2DM 2008 Depression obesity psoriasis polycythemia  Dr Elease Hashimoto [facility] hematology Dr Buford Dresser optho Colonoscopy due 2013 CT for pulm nodule 2010  Past Surgical History: Reviewed history from 11/24/2009 and no changes required. breast surgery - milk duct TAH with BSO for heavy periods   Social History: Reviewed history from 11/24/2009 and no changes required. Married, son and daughter in [place]. 2 grandsons. Quit smoking 1993 Alcohol use-no Drug use-no No exercise . 6 cups of coffee daily  Review of Systems      See HPI  Physical Exam  General:  alert,  well-developed, well-nourished, and well-hydrated.  obese Head:  normocephalic and atraumatic.   Eyes:  pupils equal, pupils round, and pupils reactive to light.  no nystagmus Ears:  EACs patent; TMs translucent and gray with good cone of light and bony landmarks.  with external eczema on the R Nose:  no nasal discharge.   Mouth:  pharynx pink and moist.   Neck:  no masses.   Lungs:  Normal respiratory effort, chest expands symmetrically. Lungs are clear to auscultation, no crackles or wheezes. Heart:  normal rate, regular rhythm, no murmur, no gallop, and no rub.   Extremities:  no LE edema Skin:  color normal.   Psych:  good eye contact, not anxious appearing, and not depressed appearing.     Impression & Recommendations:  Problem # 1:  DIABETES MELLITUS, TYPE II (ICD-250.00) Poor control.  A1C 12.8 from 10.  Financial problems have lead to insulin non compliance and she continues to not adhere to diet and exercise recommendations.  May retry nutrtionist.   Her updated medication list for this problem includes:    Novolog Mix 70/30 Flexpen 70-30 % Susp (Insulin aspart prot & aspart) .Marland KitchenMarland KitchenMarland KitchenMarland Kitchen 60 units [place] injection two  times a day, take with breakfast and dinner    Lisinopril-hydrochlorothiazide 20-12.5 Mg Tabs (Lisinopril-hydrochlorothiazide) .Marland Kitchen... 2  tab by mouth qam    Janumet 50-500 Mg Tabs (Sitagliptin-metformin hcl) .Marland Kitchen... 1 tab by mouth once daily with dinner    Aspir-low 81 Mg Tbec (Aspirin) .Marland Kitchen... 1 tab by mouth daily  Orders: Fingerstick (36416) Hgb A1C (77824MP) T-Comprehensive Metabolic Panel ([id number])  Problem # 2:  ESSENTIAL HYPERTENSION, BENIGN (ICD-401.1) BP high secondary to med non compliance.  will continue current meds for now and update her labs, encouraging compliance. Her updated medication list for this problem includes:    Metoprolol Tartrate 100 Mg Tabs (Metoprolol tartrate) .Marland Kitchen... 1 tab by mouth bid    Lisinopril-hydrochlorothiazide 20-12.5 Mg Tabs  (Lisinopril-hydrochlorothiazide) .Marland Kitchen... 2  tab by mouth qam  BP today: 183/96 Prior BP: 153/85 (07/10/2010)  Labs Reviewed: K+: 4.5 (06/02/2010) Creat: : 0.77 (06/02/2010)   Chol: 207 (07/08/2009)   HDL: 34 (07/08/2009)   LDL: 105 (07/08/2009)   TG: 338 (07/08/2009)  Problem # 3:  DERMATITIS, ATOPIC (ICD-691.8) Treat ext ear eczema with HC ointment. Her updated medication list for this problem includes:    Hydrocortisone 0.5 % Oint (Hydrocortisone) .Marland Kitchen... Apply to r external ear two times a day x 10 days  Problem # 4:  DIZZINESS (ICD-780.4) Dysequilibrium.  Not improved with Meclizine and the only finding on ear exam is external. This could be from her neuropathy or she may be orthostatic from high sugars and diuresis.  BP stable here.  Will get labs today to check for a high BUN level.  Her head injry (mild w/o LOC, vomitting, blurry vision, or HA) was after onset of the dizziness.  Complete Medication List: 1)  Pravastatin Sodium 40 Mg Tabs (Pravastatin sodium) .... Take 1 tab by mouth at bedtime 2)  Novolog Mix 70/30 Flexpen 70-30 % Susp (Insulin aspart prot & aspart) .... 60 units [place] injection two times a day, take with breakfast and dinner 3)  Metoprolol Tartrate 100 Mg Tabs (Metoprolol tartrate) .Marland Kitchen.. 1 tab by mouth bid 4)  Triamcinolone 0.1% Cream: Eucerin 1:1  .... Apply to psoriasis two times a day x 10 days 5)  Lisinopril-hydrochlorothiazide 20-12.5 Mg Tabs (Lisinopril-hydrochlorothiazide) .... 2  tab by mouth qam 6)  Janumet 50-500 Mg Tabs (Sitagliptin-metformin hcl) .Marland Kitchen.. 1 tab by mouth once daily with dinner 7)  Aspir-low 81 Mg Tbec (Aspirin) .Marland Kitchen.. 1 tab by mouth daily 8)  Fluoxetine Hcl 40 Mg Caps (Fluoxetine hcl) .Marland Kitchen.. 1 capsule by mouth qam 9)  Hydroxyurea 500 Mg Caps (Hydroxyurea) .... Take 1 cap by mouth once daily as directed 10)  Ketoconazole 2 % Crea (Ketoconazole) .... Apply two times a day 11)  Gabapentin 300 Mg Caps (Gabapentin) .Marland Kitchen.. 1 capsule by mouth at bedtime x 3  days then increase to 2 capsules by mouth qhs 12)  Acyclovir 400 Mg Tabs (Acyclovir) .Marland Kitchen.. 1 tab by mouth three times a day x 10 days 13)  Hydrocortisone 0.5 % Oint (Hydrocortisone) .... Apply to r external ear two times a day x 10 days  Other Orders: T-CBC w/Diff ([id number]) T-Lipid Profile ([id number]) T-TSH ([phone])  Patient Instructions: 1)  Fasting labs today. 2)  Will call you w/ results. 3)  Work on diabetic diet, regular exercise and insulin adherence. 4)  AM fasting goal 80-130. 5)  2 hr after dinner goal <150. 6)  Use Rx ointment for R external ear eczema. 7)  Call if dizziness has not resolved in 7 days. Prescriptions: HYDROCORTISONE  0.5 % OINT (HYDROCORTISONE) apply to R external ear two times a day x 10 days  #1 tube x 0   Entered and Authorized by:   Loyal Gambler DO   Signed by:   Loyal Gambler DO on 11/16/2010   Method used:   Electronically to        [facility] [phone]* (retail)       [place], [place]  [address]       Ph: [phone] or [phone]       Fax: [phone]   RxID:   [id number] ACYCLOVIR 400 MG TABS (ACYCLOVIR) 1 tab by mouth three times a day x 10 days  #30 x 2   Entered and Authorized by:   Loyal Gambler DO   Signed by:   Loyal Gambler DO on 11/16/2010   Method used:   Electronically to        [facility] [phone]* (retail)       [place], [place]  [address]       Ph: [phone] or [phone]       Fax: [phone]   RxID:   [id number]    Orders Added: 1)  Fingerstick [75102] 2)  Hgb A1C [83036QW] 3)  T-CBC w/Diff [[id number]] 4)  T-Comprehensive Metabolic Panel [[id number]] 5)  T-Lipid Profile [[id number]] 6)  T-TSH [[id number]] 7)  Est. Patient Level IV [09326]    Laboratory Results   Blood Tests     HGBA1C: 12.8%   (Normal Range: Non-Diabetic - 3-6%   Control Diabetic - 6-8%)

## 2010-12-07 ENCOUNTER — Encounter: Payer: Self-pay | Admitting: Family Medicine

## 2010-12-15 NOTE — Consult Note (Signed)
Summary: [facility]   Imported By: Edmonia James 12/10/2010 11:14:28  _____________________________________________________________________  External Attachment:    Type:   Image     Comment:   External Document

## 2010-12-17 ENCOUNTER — Encounter: Payer: Self-pay | Admitting: Family Medicine

## 2010-12-29 NOTE — Consult Note (Signed)
Summary: [facility]   Imported By: Edmonia James 12/25/2010 11:14:00  _____________________________________________________________________  External Attachment:    Type:   Image     Comment:   External Document

## 2011-01-11 ENCOUNTER — Other Ambulatory Visit: Payer: Self-pay | Admitting: Oncology

## 2011-01-11 ENCOUNTER — Encounter: Payer: Self-pay | Admitting: Family Medicine

## 2011-01-11 ENCOUNTER — Encounter (HOSPITAL_BASED_OUTPATIENT_CLINIC_OR_DEPARTMENT_OTHER): Payer: Medicare HMO | Admitting: Oncology

## 2011-01-11 DIAGNOSIS — I10 Essential (primary) hypertension: Secondary | ICD-10-CM

## 2011-01-11 DIAGNOSIS — D45 Polycythemia vera: Secondary | ICD-10-CM

## 2011-01-11 DIAGNOSIS — Z86718 Personal history of other venous thrombosis and embolism: Secondary | ICD-10-CM

## 2011-01-11 DIAGNOSIS — E119 Type 2 diabetes mellitus without complications: Secondary | ICD-10-CM

## 2011-01-11 LAB — COMPREHENSIVE METABOLIC PANEL
ALT: 30 U/L (ref 0–35)
AST: 59 U/L — ABNORMAL HIGH (ref 0–37)
Albumin: 4.4 g/dL (ref 3.5–5.2)
BUN: 12 mg/dL (ref 6–23)
CO2: 25 mEq/L (ref 19–32)
Calcium: 9.7 mg/dL (ref 8.4–10.5)
Chloride: 97 mEq/L (ref 96–112)
Creatinine, Ser: 0.83 mg/dL (ref 0.40–1.20)
Potassium: 4 mEq/L (ref 3.5–5.3)

## 2011-01-11 LAB — CBC WITH DIFFERENTIAL/PLATELET
BASO%: 0.5 % (ref 0.0–2.0)
HCT: 47 % — ABNORMAL HIGH (ref 34.8–46.6)
LYMPH%: 14.2 % (ref 14.0–49.7)
MCHC: 33.9 g/dL (ref 31.5–36.0)
MCV: 97.4 fL (ref 79.5–101.0)
MONO#: 0.4 10*3/uL (ref 0.1–0.9)
MONO%: 2.9 % (ref 0.0–14.0)
NEUT%: 81.4 % — ABNORMAL HIGH (ref 38.4–76.8)
Platelets: 396 10*3/uL (ref 145–400)
RBC: 4.83 10*6/uL (ref 3.70–5.45)

## 2011-01-11 LAB — LACTATE DEHYDROGENASE: LDH: 163 U/L (ref 94–250)

## 2011-01-11 LAB — MORPHOLOGY: PLT EST: ADEQUATE

## 2011-01-11 LAB — CHCC SMEAR

## 2011-01-14 ENCOUNTER — Other Ambulatory Visit (HOSPITAL_BASED_OUTPATIENT_CLINIC_OR_DEPARTMENT_OTHER): Payer: Self-pay | Admitting: Internal Medicine

## 2011-01-14 ENCOUNTER — Encounter (HOSPITAL_BASED_OUTPATIENT_CLINIC_OR_DEPARTMENT_OTHER): Payer: Medicare HMO | Admitting: Oncology

## 2011-01-14 ENCOUNTER — Encounter (HOSPITAL_BASED_OUTPATIENT_CLINIC_OR_DEPARTMENT_OTHER): Payer: Medicare HMO | Attending: Internal Medicine

## 2011-01-14 DIAGNOSIS — L02419 Cutaneous abscess of limb, unspecified: Secondary | ICD-10-CM | POA: Insufficient documentation

## 2011-01-14 DIAGNOSIS — D45 Polycythemia vera: Secondary | ICD-10-CM

## 2011-01-14 DIAGNOSIS — E119 Type 2 diabetes mellitus without complications: Secondary | ICD-10-CM | POA: Insufficient documentation

## 2011-01-18 ENCOUNTER — Ambulatory Visit: Payer: Self-pay | Admitting: Family Medicine

## 2011-01-21 ENCOUNTER — Encounter (HOSPITAL_BASED_OUTPATIENT_CLINIC_OR_DEPARTMENT_OTHER): Payer: Medicare HMO | Attending: Internal Medicine

## 2011-01-21 ENCOUNTER — Other Ambulatory Visit (HOSPITAL_BASED_OUTPATIENT_CLINIC_OR_DEPARTMENT_OTHER): Payer: Self-pay | Admitting: Internal Medicine

## 2011-01-21 DIAGNOSIS — L02419 Cutaneous abscess of limb, unspecified: Secondary | ICD-10-CM | POA: Insufficient documentation

## 2011-01-21 DIAGNOSIS — E119 Type 2 diabetes mellitus without complications: Secondary | ICD-10-CM | POA: Insufficient documentation

## 2011-01-21 DIAGNOSIS — D45 Polycythemia vera: Secondary | ICD-10-CM | POA: Insufficient documentation

## 2011-01-21 DIAGNOSIS — A4902 Methicillin resistant Staphylococcus aureus infection, unspecified site: Secondary | ICD-10-CM | POA: Insufficient documentation

## 2011-01-21 DIAGNOSIS — L03119 Cellulitis of unspecified part of limb: Secondary | ICD-10-CM | POA: Insufficient documentation

## 2011-01-24 ENCOUNTER — Other Ambulatory Visit: Payer: Self-pay | Admitting: Family Medicine

## 2011-01-28 ENCOUNTER — Other Ambulatory Visit (HOSPITAL_BASED_OUTPATIENT_CLINIC_OR_DEPARTMENT_OTHER): Payer: Self-pay | Admitting: Internal Medicine

## 2011-01-28 LAB — GLUCOSE, CAPILLARY: Glucose-Capillary: 389 mg/dL — ABNORMAL HIGH (ref 70–99)

## 2011-02-04 ENCOUNTER — Encounter (HOSPITAL_BASED_OUTPATIENT_CLINIC_OR_DEPARTMENT_OTHER): Payer: Medicare HMO | Admitting: Oncology

## 2011-02-04 ENCOUNTER — Ambulatory Visit (HOSPITAL_COMMUNITY)
Admission: RE | Admit: 2011-02-04 | Discharge: 2011-02-04 | Disposition: A | Discharge status: Home / Self Care | Payer: Medicare HMO | Source: Ambulatory Visit | Attending: Oncology | Admitting: Oncology

## 2011-02-04 ENCOUNTER — Other Ambulatory Visit: Payer: Self-pay | Admitting: Oncology

## 2011-02-04 DIAGNOSIS — M7989 Other specified soft tissue disorders: Secondary | ICD-10-CM

## 2011-02-04 DIAGNOSIS — M79609 Pain in unspecified limb: Secondary | ICD-10-CM | POA: Insufficient documentation

## 2011-02-04 DIAGNOSIS — E119 Type 2 diabetes mellitus without complications: Secondary | ICD-10-CM

## 2011-02-04 DIAGNOSIS — D45 Polycythemia vera: Secondary | ICD-10-CM

## 2011-02-04 DIAGNOSIS — I10 Essential (primary) hypertension: Secondary | ICD-10-CM

## 2011-02-04 LAB — CBC WITH DIFFERENTIAL/PLATELET
Basophils Absolute: 0.1 10*3/uL (ref 0.0–0.1)
EOS%: 1.8 % (ref 0.0–7.0)
Eosinophils Absolute: 0.2 10*3/uL (ref 0.0–0.5)
HCT: 39.4 % (ref 34.8–46.6)
HGB: 13.7 g/dL (ref 11.6–15.9)
MONO#: 0.6 10*3/uL (ref 0.1–0.9)
NEUT#: 7.6 10*3/uL — ABNORMAL HIGH (ref 1.5–6.5)
NEUT%: 70.2 % (ref 38.4–76.8)
RDW: 16.8 % — ABNORMAL HIGH (ref 11.2–14.5)
WBC: 10.9 10*3/uL — ABNORMAL HIGH (ref 3.9–10.3)
lymph#: 2.3 10*3/uL (ref 0.9–3.3)

## 2011-02-05 LAB — IRON AND TIBC
%SAT: 24 % (ref 20–55)
Iron: 64 ug/dL (ref 42–145)
TIBC: 271 ug/dL (ref 250–470)
UIBC: 207 ug/dL

## 2011-02-05 LAB — FERRITIN: Ferritin: 100 ng/mL (ref 10–291)

## 2011-02-11 ENCOUNTER — Encounter (HOSPITAL_BASED_OUTPATIENT_CLINIC_OR_DEPARTMENT_OTHER): Payer: Medicare HMO | Attending: Internal Medicine

## 2011-03-18 ENCOUNTER — Other Ambulatory Visit: Payer: Self-pay | Admitting: Oncology

## 2011-03-18 ENCOUNTER — Encounter (HOSPITAL_BASED_OUTPATIENT_CLINIC_OR_DEPARTMENT_OTHER): Payer: Medicare HMO | Admitting: Oncology

## 2011-03-18 DIAGNOSIS — D45 Polycythemia vera: Secondary | ICD-10-CM

## 2011-03-18 LAB — CBC WITH DIFFERENTIAL/PLATELET
BASO%: 0.4 % (ref 0.0–2.0)
EOS%: 4.3 % (ref 0.0–7.0)
HCT: 42.1 % (ref 34.8–46.6)
MCH: 33.8 pg (ref 25.1–34.0)
MCHC: 33.5 g/dL (ref 31.5–36.0)
NEUT%: 62.7 % (ref 38.4–76.8)
RBC: 4.17 10*6/uL (ref 3.70–5.45)
WBC: 7.5 10*3/uL (ref 3.9–10.3)
lymph#: 2 10*3/uL (ref 0.9–3.3)
nRBC: 0 % (ref 0–0)

## 2011-03-23 ENCOUNTER — Other Ambulatory Visit: Payer: Self-pay | Admitting: Family Medicine

## 2011-03-23 DIAGNOSIS — Z1231 Encounter for screening mammogram for malignant neoplasm of breast: Secondary | ICD-10-CM

## 2011-04-07 ENCOUNTER — Ambulatory Visit
Admission: RE | Admit: 2011-04-07 | Discharge: 2011-04-07 | Disposition: A | Discharge status: Home / Self Care | Payer: Medicare HMO | Source: Ambulatory Visit | Attending: Family Medicine | Admitting: Family Medicine

## 2011-04-07 DIAGNOSIS — Z1231 Encounter for screening mammogram for malignant neoplasm of breast: Secondary | ICD-10-CM

## 2011-05-13 ENCOUNTER — Encounter (HOSPITAL_BASED_OUTPATIENT_CLINIC_OR_DEPARTMENT_OTHER): Payer: Medicare HMO | Admitting: Oncology

## 2011-05-13 ENCOUNTER — Other Ambulatory Visit: Payer: Self-pay | Admitting: Oncology

## 2011-05-13 DIAGNOSIS — D45 Polycythemia vera: Secondary | ICD-10-CM

## 2011-05-13 LAB — CBC WITH DIFFERENTIAL/PLATELET
EOS%: 0 % (ref 0.0–7.0)
Eosinophils Absolute: 0 10*3/uL (ref 0.0–0.5)
MCH: 33.9 pg (ref 25.1–34.0)
MCV: 99.1 fL (ref 79.5–101.0)
MONO%: 5 % (ref 0.0–14.0)
NEUT#: 6.7 10*3/uL — ABNORMAL HIGH (ref 1.5–6.5)
RBC: 4.31 10*6/uL (ref 3.70–5.45)
RDW: 12.9 % (ref 11.2–14.5)
lymph#: 2.3 10*3/uL (ref 0.9–3.3)
nRBC: 0 % (ref 0–0)

## 2011-05-13 LAB — COMPREHENSIVE METABOLIC PANEL
ALT: 10 U/L (ref 0–35)
AST: 22 U/L (ref 0–37)
Creatinine, Ser: 0.8 mg/dL (ref 0.50–1.10)
Total Bilirubin: 0.6 mg/dL (ref 0.3–1.2)

## 2011-05-13 LAB — TECHNOLOGIST REVIEW

## 2011-07-19 ENCOUNTER — Encounter: Payer: Medicare HMO | Admitting: Oncology

## 2011-07-19 ENCOUNTER — Other Ambulatory Visit: Payer: Self-pay | Admitting: Oncology

## 2011-07-19 DIAGNOSIS — D45 Polycythemia vera: Secondary | ICD-10-CM

## 2011-07-19 LAB — CBC WITH DIFFERENTIAL/PLATELET
BASO%: 0.4 % (ref 0.0–2.0)
EOS%: 4.6 % (ref 0.0–7.0)
HCT: 43.2 % (ref 34.8–46.6)
LYMPH%: 23 % (ref 14.0–49.7)
MCH: 32.7 pg (ref 25.1–34.0)
MCHC: 34.3 g/dL (ref 31.5–36.0)
NEUT%: 67.3 % (ref 38.4–76.8)
Platelets: 322 10*3/uL (ref 145–400)

## 2011-08-16 ENCOUNTER — Telehealth: Payer: Self-pay | Admitting: Family Medicine

## 2011-08-16 NOTE — Telephone Encounter (Signed)
Pharmacist called to verify the doses on pravastatin and metoprolol. Plan:  Doses verified. Morene Rankins, LPN Lynne Logan

## 2011-09-04 ENCOUNTER — Other Ambulatory Visit: Payer: Self-pay | Admitting: Oncology

## 2011-09-07 ENCOUNTER — Encounter: Payer: Self-pay | Admitting: *Deleted

## 2011-09-08 ENCOUNTER — Other Ambulatory Visit: Payer: Self-pay | Admitting: Oncology

## 2011-09-08 DIAGNOSIS — D45 Polycythemia vera: Secondary | ICD-10-CM

## 2011-09-10 ENCOUNTER — Other Ambulatory Visit: Payer: Self-pay

## 2011-09-13 ENCOUNTER — Ambulatory Visit: Payer: Medicare HMO

## 2011-09-13 ENCOUNTER — Telehealth: Payer: Self-pay | Admitting: Oncology

## 2011-09-13 ENCOUNTER — Telehealth: Payer: Self-pay | Admitting: *Deleted

## 2011-09-13 ENCOUNTER — Other Ambulatory Visit: Payer: Self-pay | Admitting: Oncology

## 2011-09-13 ENCOUNTER — Ambulatory Visit (HOSPITAL_BASED_OUTPATIENT_CLINIC_OR_DEPARTMENT_OTHER): Payer: Medicare HMO | Admitting: Oncology

## 2011-09-13 ENCOUNTER — Other Ambulatory Visit (HOSPITAL_BASED_OUTPATIENT_CLINIC_OR_DEPARTMENT_OTHER): Payer: Medicare HMO | Admitting: Lab

## 2011-09-13 VITALS — BP 182/91 | HR 96 | Temp 97.0°F | Ht 64.5 in | Wt 212.0 lb

## 2011-09-13 DIAGNOSIS — D45 Polycythemia vera: Secondary | ICD-10-CM

## 2011-09-13 DIAGNOSIS — R030 Elevated blood-pressure reading, without diagnosis of hypertension: Secondary | ICD-10-CM

## 2011-09-13 DIAGNOSIS — D751 Secondary polycythemia: Secondary | ICD-10-CM

## 2011-09-13 DIAGNOSIS — R197 Diarrhea, unspecified: Secondary | ICD-10-CM

## 2011-09-13 DIAGNOSIS — E119 Type 2 diabetes mellitus without complications: Secondary | ICD-10-CM

## 2011-09-13 LAB — CBC WITH DIFFERENTIAL/PLATELET
BASO%: 0.4 % (ref 0.0–2.0)
Eosinophils Absolute: 0.4 10*3/uL (ref 0.0–0.5)
LYMPH%: 14.1 % (ref 14.0–49.7)
MCHC: 34.2 g/dL (ref 31.5–36.0)
MONO#: 0.4 10*3/uL (ref 0.1–0.9)
MONO%: 2.9 % (ref 0.0–14.0)
NEUT#: 10.7 10*3/uL — ABNORMAL HIGH (ref 1.5–6.5)
RBC: 4.79 10*6/uL (ref 3.70–5.45)
RDW: 12.1 % (ref 11.2–14.5)
WBC: 13.4 10*3/uL — ABNORMAL HIGH (ref 3.9–10.3)

## 2011-09-13 LAB — COMPREHENSIVE METABOLIC PANEL
Albumin: 4.1 g/dL (ref 3.5–5.2)
Alkaline Phosphatase: 82 U/L (ref 39–117)
BUN: 13 mg/dL (ref 6–23)
Calcium: 9.7 mg/dL (ref 8.4–10.5)
Chloride: 104 mEq/L (ref 96–112)
Glucose, Bld: 257 mg/dL — ABNORMAL HIGH (ref 70–99)
Potassium: 4.3 mEq/L (ref 3.5–5.3)
Sodium: 138 mEq/L (ref 135–145)
Total Protein: 6.4 g/dL (ref 6.0–8.3)

## 2011-09-13 LAB — CHCC SMEAR

## 2011-09-13 LAB — FERRITIN: Ferritin: 96 ng/mL (ref 10–291)

## 2011-09-13 NOTE — Patient Instructions (Signed)
Patient aware of next appointment; discharged home with friend; patient discharged with no complaints.

## 2011-09-13 NOTE — Progress Notes (Signed)
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; [name]  She had a lot of kosher foods which [place]  She has been having diarrhea for the past 10 days.  Each day, she has up to 4-5 bowel movements with loose stool.  She has some right lower quadrant abdominal cramp that is described as mild.  She has mild fatigue from all these diarrhea.  She denies nausea/vomiting/fever/ SOB/ cough/ melena/ hematochezia.  She does have hemorrhoids, and feel some burning sensation with bowel movement due to the hemorrhoids and diarrhea.  She said that she had seen Dr. NOrland Penmanand had numerous stool tests that were negative.    With regard to her PV, she denies thrombotic symptoms such as leg swelling/pain, chest pain, SOB, DOE, headache, focal motor weakness.  She is tolerating Hydrea without problem such as skin rash, nausea/vomiting.  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    MEDICAL HISTORY: Past Medical History  Diagnosis Date  . Hypertension   . Hyperlipidemia   . Diabetes mellitus 2008  . Depression   . Obesity   . Psoriasis   . Polycythemia     Dr. WElease Hashimoto [facility] hematology    SURGICAL HISTORY:  Past Surgical History  Procedure Date  . Breast surgery     milk duct  . Total abdominal hysterectomy w/ bilateral salpingoophorectomy     for heavy periods    MEDICATIONS: Current Outpatient Prescriptions  Medication Sig Dispense Refill  . acetaminophen-codeine (TYLENOL #3) 300-30 MG per tablet Take 1-2 tablets by mouth 3 (three) times daily.        .Marland Kitchenacyclovir (ZOVIRAX) 400 MG tablet Take 400 mg by mouth 3  (three) times daily.        .Marland Kitchenaspirin 81 MG EC tablet Take 81 mg by mouth daily.        .Marland KitchenFLUoxetine (PROZAC) 40 MG capsule Take 40 mg by mouth every morning.        . gabapentin (NEURONTIN) 300 MG capsule Take 600 mg by mouth at bedtime.        . hydroxyurea (HYDREA) 500 MG capsule Take by mouth daily.        . insulin aspart protamine-insulin aspart (NOVOLOG 70/30) (70-30) 100 UNIT/ML injection Inject 40 Units into the skin 3 (three) times daily before meals. 40 units at breakfast and lunch and 60 units at dinner      . ketoconazole (NIZORAL) 2 % cream Apply topically 2 (two) times daily.        . metoprolol (LOPRESSOR) 100 MG tablet Take 100 mg by mouth 2 (two) times daily.        . pravastatin (PRAVACHOL) 80 MG tablet Take 80 mg by mouth at bedtime.        . sitaGLIPtan-metformin (JANUMET) 50-500 MG per tablet Take 1 tablet by mouth daily with dinner.        . triamcinolone (KENALOG) 0.1 % cream Apply topically as directed. Apply to psoriasis bid X 10 days       . hydrocortisone 0.5 % ointment Apply topically as directed. Apply  to R external ear bid X 10 days       . lisinopril-hydrochlorothiazide (PRINZIDE,ZESTORETIC) 20-12.5 MG per tablet Take 2 tablets by mouth every morning.        Marland Kitchen PROAIR HFA 108 (90 BASE) MCG/ACT inhaler         ALLERGIES:  is allergic to doxycycline and latex.  REVIEW OF SYSTEMS:  The rest of the 14-point review of system was negative.   Filed Vitals:   09/13/11 0915  BP: 182/91  Pulse: 96  Temp: 97 F (36.1 C)   Wt Readings from Last 3 Encounters:  09/13/11 212 lb (96.163 kg)  05/13/11 207 lb 1.6 oz (93.94 kg)  11/16/10 196 lb (88.905 kg)   ECOG Performance status: 1  PHYSICAL EXAMINATION:   General:  Mildly obese woman in no acute distress.  Eyes:  no scleral icterus.  ENT:  There were no oropharyngeal lesions.  Neck was without thyromegaly.  Lymphatics:  Negative cervical, supraclavicular or axillary adenopathy.  Respiratory: lungs were clear  bilaterally without wheezing or crackles.  Cardiovascular:  Regular rate and rhythm, S1/S2, without murmur, rub or gallop.  There was no pedal edema.  GI:  abdomen was soft, flat, nontender, nondistended, without organomegaly.  Muscoloskeletal:  no spinal tenderness of palpation of vertebral spine.  Skin exam was without echymosis, petichae but positive for silvery, flaky skin rash in the right ear. Neuro exam was nonfocal.  Patient was able to get on and off exam table without assistance.  Gait was normal.  Patient was alerted and oriented.  Attention was good.   Language was appropriate.  Mood was normal without depression.  Speech was not pressured.  Thought content was not tangential.      LABORATORY/RADIOLOGY DATA:  Lab Results  Component Value Date   WBC 13.4* 09/13/2011   HGB 15.4 09/13/2011   HCT 45.2 09/13/2011   PLT 366 09/13/2011   GLUCOSE 169* 05/13/2011   CHOL 211* 11/16/2010   TRIG 321* 11/16/2010   HDL 31* 11/16/2010   LDLCALC 116* 11/16/2010   ALT 10 05/13/2011   AST 22 05/13/2011   NA 140 05/13/2011   K 4.1 05/13/2011   CL 104 05/13/2011   CREATININE 0.80 05/13/2011   BUN 16 05/13/2011   CO2 25 05/13/2011   TSH 2.535 11/16/2010   HGBA1C 12.8 11/16/2010   I personally reviewed the patient's peripheral blood smear today.  There was isocytosis.  There was no peripheral blast.  There was rare atypical lymphocytes.   There was no schistocytosis, spherocytosis, target cell, rouleaux formation, tear drop cell.  There was no giant platelets or platelet clumps.    ASSESSMENT AND PLAN:   1.  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  I recommended to increase her Hydrea.  However, in the past, when she was on higher dose of Hydrea, she has worsening skin rash, some nausea/vomiting.  She prefers to continue with phlebotomy for when Hct is > 45 and continue Hydrea at 540m PO BID.  2.  Diarrhea:  Unclear etiology as it has been going on for more than a week now despite negative  stool work up with PCP.  Her last colonoscopy with Dr. EOletta Lamasin 2012 showed some polyps and she's due for the next one in 2014.  However, I advised her to f/u with her PCP since there may be some occult GI infection vs. Inflammatory condition since she has leukocytosis as well compared to a few months ago.  3.  HTN:  She is on Metoprolol; Lilsinopril/HCTZ with still elevated BP.  I advised her to f/u with her PCP for titration of blood pressure meds if indicated.   4.  DM:  She is on insulin, and sitagliptan/metformin per PCP.  5.  HLP:  She is on Pravastatin per PCP.  6.  Age appropriate cancer screening:  Her last mammogram in 02/2011 was negative per her report.  Next one is due in 02/2012.  7.  Follow up:  q66monthCBC and phlebotomy for Hct >45.  I'll see her in 6 months.

## 2011-09-13 NOTE — Telephone Encounter (Signed)
Pt called to get her appts d/t.   Gave pt schedule for Jan, March and May of 2013.  She verbalized understanding.

## 2011-09-13 NOTE — Telephone Encounter (Signed)
lmonvm advisng the pt of her jan appts and for her to pick up the rest of her appts calendars for march and may 2013

## 2011-11-08 ENCOUNTER — Other Ambulatory Visit: Payer: Self-pay | Admitting: Oncology

## 2011-11-08 DIAGNOSIS — D751 Secondary polycythemia: Secondary | ICD-10-CM

## 2011-11-10 ENCOUNTER — Encounter (HOSPITAL_BASED_OUTPATIENT_CLINIC_OR_DEPARTMENT_OTHER): Payer: Medicare Other | Attending: Plastic Surgery

## 2011-11-10 DIAGNOSIS — L98499 Non-pressure chronic ulcer of skin of other sites with unspecified severity: Secondary | ICD-10-CM | POA: Insufficient documentation

## 2011-11-10 LAB — GLUCOSE, CAPILLARY: Glucose-Capillary: 464 mg/dL — ABNORMAL HIGH (ref 70–99)

## 2011-11-10 NOTE — Progress Notes (Signed)
[facility]  NAME:  TURNER, KUNZMAN                 ACCOUNT NO.:  [id number]  MEDICAL RECORD NO.:  [id number]      DATE OF BIRTH:  10-10-1941  PHYSICIAN:  Theodoro Kos, DO       VISIT DATE:  11/10/2011                                  OFFICE VISIT   HISTORY:  Ms. Anglemyer is a 71 year old white female, who is here with her husband for evaluation of an abdominal ulcer.  She has multiple medical problems including obesity and diabetes, but noticed an ulceration of the abdominal wall area over the past 2 weeks and got very nervous about it becoming infected.  She has had nonhealing ulcers in the past in the lower extremity area and understands that she is prone to this due to her diabetes.  She was given doxycycline and takes the last one today and this did help the wound.  She has also been trying some warm compresses, which has helped.  PAST MEDICAL HISTORY:  Positive for hypertension, hyperlipidemia, diabetes, depression, obesity, psoriasis, and polycythemia.  SURGICAL HISTORY:  Total abdominal hysterectomy with bilateral salpingo- oophorectomy, and breast surgery.  MEDICATIONS:  Include Zovirax, Prozac, Neurontin, Hydrea, insulin, Nizoral, Lopressor, Pravachol, Janumet, Kenalog cream, Zestoretic, and ProAir.  ALLERGIES:  LATEX.  REVIEW OF SYSTEMS:  She denies any significant weight change, blood in her urine or stool, difficulty breathing, or chest pain.  She did have a recent GI bug from when she came back from a trip to [place], but that has since resolved.  She had some difficulty eating, but no weight loss, and is back to normal diet as well.  PHYSICAL EXAMINATION:  GENERAL:  She is alert, oriented, cooperative, not in any acute distress.  She is very pleasant. HEENT:  Her pupils are equal.  Extraocular muscles are intact. NECK:  No cervical lymphadenopathy. LUNGS:  Breathing is unlabored. HEART:  Regular. ABDOMEN:  Soft and nontender.  No  rebound. EXTREMITIES:  Lower extremity has good capillary refill.  She is wearing compression-type stockings.  The wound is in the right abdominal area and the sizes are noted in the nurse's note.  There is a little bit of redness with a central eschar. This could be related to a bug bite or too little infection in the dermal areas secondary to the insulin injection, but it does not appear to be grossly infected.  I agree with the doxycycline and recommend once or twice a day showers and Silvadene cream, and I would like to see her back in a week.  In the meantime, I have also reviewed her diabetes issue and strongly encouraged her to have diabetic control.  She should modify her diet.  If there is any prolongation in the healing process, then we will look at her prealbumin and may need to send her for nutritional counseling to help improve her BMI as well and she acknowledges understanding of this. I will see her back in 1 week.     Theodoro Kos, DO     CS/MEDQ  D:  11/10/2011  T:  11/10/2011  Job:  [id number]

## 2011-11-11 ENCOUNTER — Telehealth: Payer: Self-pay | Admitting: *Deleted

## 2011-11-11 ENCOUNTER — Other Ambulatory Visit: Payer: Self-pay | Admitting: *Deleted

## 2011-11-11 NOTE — Telephone Encounter (Signed)
Pt left VM states she wants to r/s her appts tomorrow d/t expecting bad weather.  POF sent to scheduling to r/s pt's appts w/i one to two weeks.

## 2011-11-12 ENCOUNTER — Other Ambulatory Visit: Payer: Medicare HMO

## 2011-11-12 ENCOUNTER — Telehealth: Payer: Self-pay | Admitting: Oncology

## 2011-11-12 NOTE — Telephone Encounter (Signed)
called pt and r/s appts on 1-25 to 2-04

## 2011-11-17 NOTE — Progress Notes (Signed)
[facility]  NAME:  LEYDY, WORTHEY                 ACCOUNT NO.:  [id number]  MEDICAL RECORD NO.:  [id number]      DATE OF BIRTH:  [birth year]  PHYSICIAN:  Theodoro Kos, DO            VISIT DATE:                                  OFFICE VISIT   Amanda [name] is a 71 year old white female here with her husband for followup on an abdominal ulcer.  She was using Silvadene on it this past week with Dial soap showers and watching her blood sugar.  She has done extremely well and been able to regulate her sugars much better.  She does not have any change in her medications.  SOCIAL HISTORY:  Unchanged.  REVIEW OF SYSTEMS:  Negative.  PHYSICAL EXAMINATION:  GENERAL:  She is alert, oriented, cooperative, not in any acute distress.  She is very pleasant and very pleased with her progress. HEENT:  Her pupils are equal.  Extraocular muscles are intact. NECK:  No cervical lymphadenopathy. LUNGS:  Breathing is unlabored. HEART:  Her heart rate is regular. ABDOMEN:  It has healed and epithelialized.  No sign of infection. SKIN:  She does have a little bit of a maculopapular rash that is diffuse and she said she had a reaction to some sweet pickles and that has cleared since.  No other sign of skin breakdown.  Her skin is very dry.  We talked about using lotion for the area, just a dry dressing, so that it does not get irritated by rubbing of her clothes and strict blood sugar control and we will see her back as needed.     Theodoro Kos, DO     CS/MEDQ  D:  11/17/2011  T:  11/17/2011  Job:  [id number]

## 2011-11-18 ENCOUNTER — Other Ambulatory Visit: Payer: Self-pay | Admitting: *Deleted

## 2011-11-22 ENCOUNTER — Other Ambulatory Visit: Payer: Self-pay | Admitting: Oncology

## 2011-11-22 ENCOUNTER — Other Ambulatory Visit: Payer: Medicare Other | Admitting: Lab

## 2011-11-22 ENCOUNTER — Ambulatory Visit (HOSPITAL_BASED_OUTPATIENT_CLINIC_OR_DEPARTMENT_OTHER): Payer: Medicare Other

## 2011-11-22 VITALS — BP 184/100 | HR 90 | Temp 97.7°F

## 2011-11-22 DIAGNOSIS — D45 Polycythemia vera: Secondary | ICD-10-CM

## 2011-11-22 DIAGNOSIS — D751 Secondary polycythemia: Secondary | ICD-10-CM

## 2011-11-22 LAB — CBC WITH DIFFERENTIAL/PLATELET
Basophils Absolute: 0 10*3/uL (ref 0.0–0.1)
EOS%: 3.6 % (ref 0.0–7.0)
Eosinophils Absolute: 0.4 10*3/uL (ref 0.0–0.5)
HGB: 15.4 g/dL (ref 11.6–15.9)
MCH: 30.9 pg (ref 25.1–34.0)
NEUT#: 7.8 10*3/uL — ABNORMAL HIGH (ref 1.5–6.5)
RBC: 4.99 10*6/uL (ref 3.70–5.45)
RDW: 12.8 % (ref 11.2–14.5)
lymph#: 2.5 10*3/uL (ref 0.9–3.3)

## 2011-12-10 ENCOUNTER — Emergency Department (HOSPITAL_COMMUNITY)
Admission: EM | Admit: 2011-12-10 | Discharge: 2011-12-10 | Disposition: A | Discharge status: Home / Self Care | Payer: Medicare Other | Attending: Emergency Medicine | Admitting: Emergency Medicine

## 2011-12-10 ENCOUNTER — Encounter (HOSPITAL_COMMUNITY): Payer: Self-pay | Admitting: *Deleted

## 2011-12-10 DIAGNOSIS — Z79899 Other long term (current) drug therapy: Secondary | ICD-10-CM | POA: Insufficient documentation

## 2011-12-10 DIAGNOSIS — E785 Hyperlipidemia, unspecified: Secondary | ICD-10-CM | POA: Insufficient documentation

## 2011-12-10 DIAGNOSIS — L0201 Cutaneous abscess of face: Secondary | ICD-10-CM | POA: Insufficient documentation

## 2011-12-10 DIAGNOSIS — L0390 Cellulitis, unspecified: Secondary | ICD-10-CM

## 2011-12-10 DIAGNOSIS — E119 Type 2 diabetes mellitus without complications: Secondary | ICD-10-CM | POA: Insufficient documentation

## 2011-12-10 DIAGNOSIS — L03211 Cellulitis of face: Secondary | ICD-10-CM | POA: Insufficient documentation

## 2011-12-10 DIAGNOSIS — I10 Essential (primary) hypertension: Secondary | ICD-10-CM | POA: Insufficient documentation

## 2011-12-10 MED ORDER — CEPHALEXIN 500 MG PO CAPS: 500.0000 mg | ORAL_CAPSULE | Freq: Four times a day (QID) | ORAL | Status: AC

## 2011-12-10 MED ORDER — SULFAMETHOXAZOLE-TRIMETHOPRIM 800-160 MG PO TABS: 1.0000 | ORAL_TABLET | Freq: Two times a day (BID) | ORAL | Status: AC

## 2011-12-10 NOTE — ED Notes (Signed)
Pt in c/o abscess to face, states she picked a pimple and it has developed into an abscess, intermittent bleeding, states the wound care center told her to follow up with dermatologist but she is unable to get an appointment

## 2011-12-10 NOTE — ED Provider Notes (Signed)
History     CSN: [id number]  Arrival date & time 12/10/11  1641   First MD Initiated Contact with Patient 12/10/11 1705      Chief Complaint  Patient presents with  . Abscess    (Consider location/radiation/quality/duration/timing/severity/associated sxs/prior treatment) HPI  Pt presents to the ED with complaints of cellulitis and abscess to face. She states she expressed a pimple two weeks ago and it continued to grow and swell until last night the wound began to bleed a lot. Her husband used some of his Stiptik and the the wound became hemostatic. The patient says that she talked to her wound care doctor who informed her that she needed to see a dermatologist. She states that all of the dermatologists she saw can not see her until April. She used Tylenol with codeine last night for pain and it relieved the pain. The patient denies weakness, N/D/V or fevers.  Past Medical History  Diagnosis Date  . Hypertension   . Hyperlipidemia   . Diabetes mellitus 2008  . Depression   . Obesity   . Psoriasis   . Polycythemia     Dr. Elease Hashimoto- [facility]    Past Surgical History  Procedure Date  . Breast surgery     milk duct  . Total abdominal hysterectomy w/ bilateral salpingoophorectomy     for heavy periods    Family History  Problem Relation Age of Onset  . Diabetes Mother   . Heart disease Mother     CAD  . Hyperlipidemia Mother   . Hypertension Mother   . Kidney disease Mother   . Kidney disease Father   . Cancer Other     breast    History  Substance Use Topics  . Smoking status: Former Smoker    Quit date: 10/19/1991  . Smokeless tobacco: Not on file  . Alcohol Use: No    OB History    Grav Para Term Preterm Abortions TAB SAB Ect Mult Living                  Review of Systems  All other systems reviewed and are negative.    Allergies  Doxycycline and Latex  Home Medications   Current Outpatient Rx  Name Route Sig Dispense Refill  .  ACETAMINOPHEN-CODEINE #3 300-30 MG PO TABS Oral Take 1-2 tablets by mouth 3 (three) times daily.      . ACYCLOVIR 400 MG PO TABS Oral Take 400 mg by mouth 3 (three) times daily.      . ASPIRIN 81 MG PO TBEC Oral Take 81 mg by mouth daily.      . CEPHALEXIN 500 MG PO CAPS Oral Take 1 capsule (500 mg total) by mouth 4 (four) times daily. 28 capsule 0  . FLUOXETINE HCL 40 MG PO CAPS Oral Take 40 mg by mouth every morning.      Marland Kitchen GABAPENTIN 300 MG PO CAPS Oral Take 600 mg by mouth at bedtime.      Marland Kitchen HYDROCORTISONE 0.5 % EX OINT Topical Apply topically as directed. Apply to R external ear bid X 10 days     . HYDROXYUREA 500 MG PO CAPS Oral Take by mouth daily.      . INSULIN ASPART PROT & ASPART (70-30) 100 UNIT/ML [place] SUSP Subcutaneous Inject 40 Units into the skin 3 (three) times daily before meals. 40 units at breakfast and lunch and 60 units at dinner    . KETOCONAZOLE 2 % EX CREA  Topical Apply topically 2 (two) times daily.      Marland Kitchen LISINOPRIL-HYDROCHLOROTHIAZIDE 20-12.5 MG PO TABS Oral Take 2 tablets by mouth every morning.      Marland Kitchen METOPROLOL TARTRATE 100 MG PO TABS Oral Take 100 mg by mouth 2 (two) times daily.      Marland Kitchen PRAVASTATIN SODIUM 80 MG PO TABS Oral Take 80 mg by mouth at bedtime.      Marland Kitchen PROAIR HFA 108 (90 BASE) MCG/ACT IN AERS      . SITAGLIPTIN-METFORMIN HCL 50-500 MG PO TABS Oral Take 1 tablet by mouth daily with dinner.      . SULFAMETHOXAZOLE-TRIMETHOPRIM 800-160 MG PO TABS Oral Take 1 tablet by mouth every 12 (twelve) hours. 10 tablet 0  . TRIAMCINOLONE ACETONIDE 0.1 % EX CREA Topical Apply topically as directed. Apply to psoriasis bid X 10 days       BP 190/92  Pulse 115  Temp(Src) 98 F (36.7 C) (Oral)  Resp 22  SpO2 96%  Physical Exam  Nursing note and vitals reviewed. Constitutional: She appears well-developed and well-nourished.  HENT:  Head: Normocephalic and atraumatic.    Eyes: Pupils are equal, round, and reactive to light.  Neck: Trachea normal, normal range of  motion and full passive range of motion without pain. Neck supple.  Cardiovascular: Normal rate, regular rhythm and normal pulses.   Pulmonary/Chest: Effort normal and breath sounds normal. Chest wall is not dull to percussion. She exhibits no crepitus, no edema, no deformity and no retraction.  Abdominal: Soft. Normal appearance.  Musculoskeletal: Normal range of motion.  Neurological: She is alert. She has normal strength.  Skin: Skin is warm, dry and intact. Rash noted.  Psychiatric: Her speech is normal. Cognition and memory are normal.    ED Course  Procedures (including critical care time)  Labs Reviewed - No data to display No results found.   1. Cellulitis       MDM  Patient discussed with Dr. Zenia Resides and he agrees with my treatment and plan. Pt will be given Bactrim and Keflex, she can take her Tylenol with codeine that she already has for pain. Abscess has already drained, no I and D needed.        Linus Mako, [place] 12/10/11 [phone]

## 2011-12-10 NOTE — Discharge Instructions (Signed)
Cellulitis Cellulitis is an infection of the skin and the tissue beneath it. The area is typically red and tender. It is caused by germs (bacteria) (usually staph or strep) that enter the body through cuts or sores. Cellulitis most commonly occurs in the arms or lower legs.  HOME CARE INSTRUCTIONS   If you are given a prescription for medications which kill germs (antibiotics), take as directed until finished.   If the infection is on the arm or leg, keep the limb elevated as able.   Use a warm cloth several times per day to relieve pain and encourage healing.   See your caregiver for recheck of the infected site as directed if problems arise.   Only take over-the-counter or prescription medicines for pain, discomfort, or fever as directed by your caregiver.  SEEK MEDICAL CARE IF:   The area of redness (inflammation) is spreading, there are red streaks coming from the infected site, or if a part of the infection begins to turn dark in color.   The joint or bone underneath the infected skin becomes painful after the skin has healed.   The infection returns in the same or another area after it seems to have gone away.   A boil or bump swells up. This may be an abscess.   New, unexplained problems such as pain or fever develop.  SEEK IMMEDIATE MEDICAL CARE IF:   You have a fever.   You or your child feels drowsy or lethargic.   There is vomiting, diarrhea, or lasting discomfort or feeling ill (malaise) with muscle aches and pains.  MAKE SURE YOU:   Understand these instructions.   Will watch your condition.   Will get help right away if you are not doing well or get worse.  Document Released: 07/14/2005 Document Revised: 06/16/2011 Document Reviewed: 05/22/2008 [facility] Patient Information 2012 [place].Abscess An abscess (boil or furuncle) is an infected area that contains a collection of pus.  SYMPTOMS Signs and symptoms of an abscess include pain, tenderness, redness,  or hardness. You may feel a moveable soft area under your skin. An abscess can occur anywhere in the body.  TREATMENT  A surgical cut (incision) may be made over your abscess to drain the pus. Gauze may be packed into the space or a drain may be looped through the abscess cavity (pocket). This provides a drain that will allow the cavity to heal from the inside outwards. The abscess may be painful for a few days, but should feel much better if it was drained.  Your abscess, if seen early, may not have localized and may not have been drained. If not, another appointment may be required if it does not get better on its own or with medications. HOME CARE INSTRUCTIONS   Only take over-the-counter or prescription medicines for pain, discomfort, or fever as directed by your caregiver.   Take your antibiotics as directed if they were prescribed. Finish them even if you start to feel better.   Keep the skin and clothes clean around your abscess.   If the abscess was drained, you will need to use gauze dressing to collect any draining pus. Dressings will typically need to be changed 3 or more times a day.   The infection may spread by skin contact with others. Avoid skin contact as much as possible.   Practice good hygiene. This includes regular hand washing, cover any draining skin lesions, and do not share personal care items.   If you participate  in sports, do not share athletic equipment, towels, whirlpools, or personal care items. Shower after every practice or tournament.   If a draining area cannot be adequately covered:   Do not participate in sports.   Children should not participate in day care until the wound has healed or drainage stops.   If your caregiver has given you a follow-up appointment, it is very important to keep that appointment. Not keeping the appointment could result in a much worse infection, chronic or permanent injury, pain, and disability. If there is any problem  keeping the appointment, you must call back to this facility for assistance.  SEEK MEDICAL CARE IF:   You develop increased pain, swelling, redness, drainage, or bleeding in the wound site.   You develop signs of generalized infection including muscle aches, chills, fever, or a general ill feeling.   You have an oral temperature above 102 F (38.9 C).  MAKE SURE YOU:   Understand these instructions.   Will watch your condition.   Will get help right away if you are not doing well or get worse.  Document Released: 07/14/2005 Document Revised: 06/16/2011 Document Reviewed: 05/07/2008 [facility] Patient Information 2012 [place].Abscess An abscess (boil or furuncle) is an infected area that contains a collection of pus.  SYMPTOMS Signs and symptoms of an abscess include pain, tenderness, redness, or hardness. You may feel a moveable soft area under your skin. An abscess can occur anywhere in the body.  TREATMENT  A surgical cut (incision) may be made over your abscess to drain the pus. Gauze may be packed into the space or a drain may be looped through the abscess cavity (pocket). This provides a drain that will allow the cavity to heal from the inside outwards. The abscess may be painful for a few days, but should feel much better if it was drained.  Your abscess, if seen early, may not have localized and may not have been drained. If not, another appointment may be required if it does not get better on its own or with medications. HOME CARE INSTRUCTIONS  Only take over-the-counter or prescription medicines for pain, discomfort, or fever as directed by your caregiver.  Take your antibiotics as directed if they were prescribed. Finish them even if you start to feel better.  Keep the skin and clothes clean around your abscess.  If the abscess was drained, you will need to use gauze dressing to collect any draining pus. Dressings will typically need to be changed 3 or more times a day.    The infection may spread by skin contact with others. Avoid skin contact as much as possible.  Practice good hygiene. This includes regular hand washing, cover any draining skin lesions, and do not share personal care items.  If you participate in sports, do not share athletic equipment, towels, whirlpools, or personal care items. Shower after every practice or tournament.  If a draining area cannot be adequately covered:  Do not participate in sports.  Children should not participate in day care until the wound has healed or drainage stops.  If your caregiver has given you a follow-up appointment, it is very important to keep that appointment. Not keeping the appointment could result in a much worse infection, chronic or permanent injury, pain, and disability. If there is any problem keeping the appointment, you must call back to this facility for assistance.  SEEK MEDICAL CARE IF:  You develop increased pain, swelling, redness, drainage, or bleeding in the wound  site.  You develop signs of generalized infection including muscle aches, chills, fever, or a general ill feeling.  You have an oral temperature above 102 F (38.9 C).  MAKE SURE YOU:  Understand these instructions.  Will watch your condition.  Will get help right away if you are not doing well or get worse.  Document Released: 07/14/2005 Document Revised: 06/16/2011 Document Reviewed: 05/07/2008 [facility] Patient Information 2012 [place].

## 2011-12-11 NOTE — ED Provider Notes (Signed)
Medical screening examination/treatment/procedure(s) were performed by non-physician practitioner and as supervising physician I was immediately available for consultation/collaboration.  Leota Jacobsen, MD 12/11/11 2027

## 2011-12-15 ENCOUNTER — Encounter (HOSPITAL_BASED_OUTPATIENT_CLINIC_OR_DEPARTMENT_OTHER): Payer: Medicare Other | Attending: Plastic Surgery

## 2011-12-15 DIAGNOSIS — L98499 Non-pressure chronic ulcer of skin of other sites with unspecified severity: Secondary | ICD-10-CM | POA: Insufficient documentation

## 2012-01-05 ENCOUNTER — Other Ambulatory Visit: Payer: Self-pay | Admitting: *Deleted

## 2012-01-06 ENCOUNTER — Other Ambulatory Visit: Payer: Self-pay | Admitting: Oncology

## 2012-01-06 DIAGNOSIS — D45 Polycythemia vera: Secondary | ICD-10-CM

## 2012-01-11 ENCOUNTER — Other Ambulatory Visit: Payer: Medicare HMO | Admitting: Lab

## 2012-01-20 ENCOUNTER — Telehealth: Payer: Self-pay | Admitting: Oncology

## 2012-01-20 NOTE — Telephone Encounter (Signed)
R/s march lb/phleb appt to 4/25 and added f/u w/[facility] per natalie. S/w pt's husband re appt for 4/25 @ 9 am.

## 2012-01-31 ENCOUNTER — Other Ambulatory Visit: Payer: Self-pay | Admitting: *Deleted

## 2012-02-10 ENCOUNTER — Ambulatory Visit (HOSPITAL_BASED_OUTPATIENT_CLINIC_OR_DEPARTMENT_OTHER): Payer: Medicare Other | Admitting: Oncology

## 2012-02-10 ENCOUNTER — Telehealth: Payer: Self-pay | Admitting: Oncology

## 2012-02-10 ENCOUNTER — Other Ambulatory Visit (HOSPITAL_BASED_OUTPATIENT_CLINIC_OR_DEPARTMENT_OTHER): Payer: Medicare Other | Admitting: Lab

## 2012-02-10 VITALS — BP 129/71 | HR 59 | Temp 97.2°F | Ht 64.5 in | Wt 207.0 lb

## 2012-02-10 DIAGNOSIS — D45 Polycythemia vera: Secondary | ICD-10-CM

## 2012-02-10 DIAGNOSIS — R197 Diarrhea, unspecified: Secondary | ICD-10-CM

## 2012-02-10 DIAGNOSIS — D751 Secondary polycythemia: Secondary | ICD-10-CM

## 2012-02-10 DIAGNOSIS — E119 Type 2 diabetes mellitus without complications: Secondary | ICD-10-CM

## 2012-02-10 DIAGNOSIS — I10 Essential (primary) hypertension: Secondary | ICD-10-CM

## 2012-02-10 LAB — CBC WITH DIFFERENTIAL/PLATELET
Basophils Absolute: 0.1 10*3/uL (ref 0.0–0.1)
Eosinophils Absolute: 0.3 10*3/uL (ref 0.0–0.5)
HCT: 43 % (ref 34.8–46.6)
HGB: 14.1 g/dL (ref 11.6–15.9)
LYMPH%: 23.9 % (ref 14.0–49.7)
MONO#: 0.6 10*3/uL (ref 0.1–0.9)
NEUT%: 66.1 % (ref 38.4–76.8)
Platelets: 455 10*3/uL — ABNORMAL HIGH (ref 145–400)
WBC: 11 10*3/uL — ABNORMAL HIGH (ref 3.9–10.3)
lymph#: 2.6 10*3/uL (ref 0.9–3.3)

## 2012-02-10 NOTE — Patient Instructions (Addendum)
1.  Diagnosis:  [name] - Continue Hydrea 500 mg by mouth twice a day. - Will continue to monitor your blood count every 2 months and phlebotomy for Hct more than 45%.  - You do not need phlebotomy today since Hct ws only 43%.   2.  Follow up:  Lab and possible phlebotomy in 2 and 4 months.  Return visit In about 6 months.

## 2012-02-10 NOTE — Telephone Encounter (Signed)
[facility] pt appt for [id number]

## 2012-02-10 NOTE — Progress Notes (Signed)
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   CURRENT THERAPY: Phlebotomy once every 2 months for Hgb > 45; and hydroxyurea 561m PO BID.  INTERVAL HISTORY: Amanda Nantz89y.o. female returns for regular follow up.  She has been taking Hydrea 5062mPO BID without problem.  She denies skin rash, nausea/vomiting.  She denies SOB, chest pain, leg/calf swelling/pain; focal motor weakness.  Patient denies fatigue, headache, visual changes, confusion, drenching night sweats, palpable lymph node swelling, mucositis, odynophagia, dysphagia, nausea vomiting, jaundice, chest pain, palpitation, shortness of breath, dyspnea on exertion, productive cough, gum bleeding, epistaxis, hematemesis, hemoptysis, abdominal pain, abdominal swelling, early satiety, melena, hematochezia, hematuria, skin rash, spontaneous bleeding, joint swelling, joint pain, heat or cold intolerance, bowel bladder incontinence, back pain, focal motor weakness, paresthesia, depression, suicidal or homocidal ideation, feeling hopelessness.   Past Medical History  Diagnosis Date  . Hypertension   . Hyperlipidemia   . Diabetes mellitus 2008  . Depression   . Obesity   . Psoriasis   . Polycythemia     [facility]    Past Surgical History  Procedure Date  . Breast surgery     milk duct  . Total abdominal hysterectomy w/ bilateral salpingoophorectomy     for heavy periods    Current Outpatient Prescriptions  Medication Sig Dispense Refill  . acetaminophen-codeine (TYLENOL #3) 300-30 MG per tablet Take 1-2 tablets by mouth 3 (three) times daily.        . Marland Kitchencyclovir (ZOVIRAX) 400 MG tablet Take 400 mg by mouth 3 (three) times daily.        . Marland Kitchenspirin 81 MG EC tablet Take 81 mg by mouth daily.        . Marland KitchenLUoxetine (PROZAC) 40 MG capsule Take 40 mg by mouth every morning.         . gabapentin (NEURONTIN) 300 MG capsule Take 600 mg by mouth at bedtime.        . hydrocortisone 0.5 % ointment Apply topically as directed. Apply to R external ear bid X 10 days       . hydroxyurea (HYDREA) 500 MG capsule Take by mouth daily.        . insulin aspart protamine-insulin aspart (NOVOLOG 70/30) (70-30) 100 UNIT/ML injection Inject 40 Units into the skin 3 (three) times daily before meals. 40 units at breakfast and lunch and 60 units at dinner      . ketoconazole (NIZORAL) 2 % cream Apply topically 2 (two) times daily.        . Marland Kitchenisinopril-hydrochlorothiazide (PRINZIDE,ZESTORETIC) 20-12.5 MG per tablet Take 2 tablets by mouth every morning.        . metoprolol (LOPRESSOR) 100 MG tablet Take 100 mg by mouth 2 (two) times daily.        . pravastatin (PRAVACHOL) 80 MG tablet Take 80 mg by mouth at bedtime.        . Marland KitchenROAIR HFA 108 (90 BASE) MCG/ACT inhaler       . sitaGLIPtan-metformin (JANUMET) 50-500 MG per tablet Take 1 tablet by mouth daily with dinner.        . triamcinolone (KENALOG) 0.1 % cream Apply topically as directed. Apply to psoriasis bid X 10 days         ALLERGIES:  is allergic to doxycycline and latex.  REVIEW OF SYSTEMS:  The rest of the  14-point review of system was negative.   Filed Vitals:   02/10/12 0905  BP: 129/71  Pulse: 59  Temp: 97.2 F (36.2 C)   Wt Readings from Last 3 Encounters:  02/10/12 207 lb (93.895 kg)  09/13/11 212 lb (96.163 kg)  05/13/11 207 lb 1.6 oz (93.94 kg)   ECOG Performance status: 0  PHYSICAL EXAMINATION:   General:  Mildly obese woman in no acute distress.  Eyes:  no scleral icterus.  ENT:  There were no oropharyngeal lesions.  Neck was without thyromegaly.  Lymphatics:  Negative cervical, supraclavicular or axillary adenopathy.  Respiratory: lungs were clear bilaterally without wheezing or crackles.  Cardiovascular:  Regular rate and rhythm, S1/S2, without murmur, rub or gallop.  There was no pedal edema.  GI:  abdomen was  soft, flat, nontender, nondistended, without organomegaly.  Muscoloskeletal:  no spinal tenderness of palpation of vertebral spine.  Skin exam was without echymosis, petichae.  Neuro exam was nonfocal.  Patient was able to get on and off exam table without assistance.  Gait was normal.  Patient was alerted and oriented.  Attention was good.   Language was appropriate.  Mood was normal without depression.  Speech was not pressured.  Thought content was not tangential.      LABORATORY/RADIOLOGY DATA:  Lab Results  Component Value Date   WBC 11.0* 02/10/2012   HGB 14.1 02/10/2012   HCT 43.0 02/10/2012   PLT 455* 02/10/2012   GLUCOSE 257* 09/13/2011   GLUCOSE 257* 09/13/2011   CHOL 211* 11/16/2010   TRIG 321* 11/16/2010   HDL 31* 11/16/2010   LDLCALC 116* 11/16/2010   ALKPHOS 82 09/13/2011   ALKPHOS 82 09/13/2011   ALT 25 09/13/2011   ALT 25 09/13/2011   AST 38* 09/13/2011   AST 38* 09/13/2011   NA 138 09/13/2011   NA 138 09/13/2011   K 4.3 09/13/2011   K 4.3 09/13/2011   CL 104 09/13/2011   CL 104 09/13/2011   CREATININE 0.74 09/13/2011   CREATININE 0.74 09/13/2011   BUN 13 09/13/2011   BUN 13 09/13/2011   CO2 25 09/13/2011   CO2 25 09/13/2011   HGBA1C 12.8 11/16/2010     ASSESSMENT AND PLAN:  1. JAK-2 negative PV: Her Hgb/Hct is at goal <45%.  She does not need phlebotomy today.  She does have slight leukocytosis and thrombocytosis as part of myeloproliferative disease presentation; however, they are not severely elevated.  I recommended to patient to continue Hydrea at current dose of 571m PO BID for now.  She has not had any thrombotic complication.  I advised her to continue ASA to also decrease the risk of clot.   2. Diarrhea:  Resolved.  Her last colonoscopy with Dr. EOletta Lamasin 2012 showed some polyps and she's due for the next one in 2014.   3. HTN: She is on Metoprolol; Lilsinopril/HCTZ with good BP control per PCP.   4. DM: She is on insulin, and sitagliptan/metformin  per PCP.   5. HLP: She is on Pravastatin per PCP.   6. Age appropriate cancer screening: Her last mammogram in 02/2011 was negative per her report. Next one is due in 02/2012.   7. Follow up: q268monthBC and phlebotomy for Hct >45. We'll see her in 6 months.

## 2012-03-10 ENCOUNTER — Other Ambulatory Visit: Payer: Medicare HMO | Admitting: Lab

## 2012-03-10 ENCOUNTER — Ambulatory Visit: Payer: Medicare HMO | Admitting: Oncology

## 2012-04-11 ENCOUNTER — Telehealth: Payer: Self-pay | Admitting: Oncology

## 2012-04-11 ENCOUNTER — Other Ambulatory Visit (HOSPITAL_BASED_OUTPATIENT_CLINIC_OR_DEPARTMENT_OTHER): Payer: Medicare Other

## 2012-04-11 ENCOUNTER — Ambulatory Visit: Payer: Medicare Other

## 2012-04-11 DIAGNOSIS — D45 Polycythemia vera: Secondary | ICD-10-CM

## 2012-04-11 LAB — CBC WITH DIFFERENTIAL/PLATELET
Basophils Absolute: 0.1 10*3/uL (ref 0.0–0.1)
Eosinophils Absolute: 0.7 10*3/uL — ABNORMAL HIGH (ref 0.0–0.5)
HCT: 43.5 % (ref 34.8–46.6)
HGB: 14.9 g/dL (ref 11.6–15.9)
LYMPH%: 18.9 % (ref 14.0–49.7)
MCV: 93.3 fL (ref 79.5–101.0)
MONO%: 4.3 % (ref 0.0–14.0)
NEUT#: 13.5 10*3/uL — ABNORMAL HIGH (ref 1.5–6.5)
NEUT%: 72.9 % (ref 38.4–76.8)
Platelets: 496 10*3/uL — ABNORMAL HIGH (ref 145–400)
RDW: 16 % — ABNORMAL HIGH (ref 11.2–14.5)

## 2012-04-11 LAB — TECHNOLOGIST REVIEW

## 2012-04-11 NOTE — Telephone Encounter (Signed)
Message copied by Maryanna Shape on Tue Apr 11, 2012 10:20 AM ------      Message from: Sherryl Manges T      Created: Tue Apr 11, 2012 10:15 AM       Please contact pt.  She does not need phlebotomy today as her Hct is <45.  However, please make sure that she is taking her Hydrea, same dose for now.  Please advise her to keep her lab in August.  Thanks.

## 2012-04-11 NOTE — Progress Notes (Signed)
Phlebotomy held due to Hct less than 45-dhp, rn

## 2012-04-11 NOTE — Telephone Encounter (Signed)
Spoke with patient. No phlebotomy today. Continue Hydrea - same dose. Keep lab appt in Aug. Pt stated understanding.

## 2012-04-12 ENCOUNTER — Telehealth: Payer: Self-pay

## 2012-04-12 NOTE — Telephone Encounter (Signed)
Message copied by Azzie Glatter on Wed Apr 12, 2012  9:30 AM ------      Message from: Sherryl Manges T      Created: Tue Apr 11, 2012 10:15 AM       Please contact pt.  She does not need phlebotomy today as her Hct is <45.  However, please make sure that she is taking her Hydrea, same dose for now.  Please advise her to keep her lab in August.  Thanks.

## 2012-06-12 ENCOUNTER — Ambulatory Visit (HOSPITAL_BASED_OUTPATIENT_CLINIC_OR_DEPARTMENT_OTHER): Payer: Medicare Other

## 2012-06-12 ENCOUNTER — Other Ambulatory Visit (HOSPITAL_BASED_OUTPATIENT_CLINIC_OR_DEPARTMENT_OTHER): Payer: Medicare Other | Admitting: Lab

## 2012-06-12 DIAGNOSIS — D45 Polycythemia vera: Secondary | ICD-10-CM

## 2012-06-12 LAB — CBC WITH DIFFERENTIAL/PLATELET
Basophils Absolute: 0.1 10*3/uL (ref 0.0–0.1)
EOS%: 3.3 % (ref 0.0–7.0)
HCT: 47.3 % — ABNORMAL HIGH (ref 34.8–46.6)
HGB: 15.4 g/dL (ref 11.6–15.9)
MCH: 31 pg (ref 25.1–34.0)
MONO#: 0.6 10*3/uL (ref 0.1–0.9)
NEUT%: 72.6 % (ref 38.4–76.8)
Platelets: 375 10*3/uL (ref 145–400)
lymph#: 2.7 10*3/uL (ref 0.9–3.3)

## 2012-06-12 NOTE — Patient Instructions (Addendum)
Therapeutic Phlebotomy Therapeutic phlebotomy is the controlled removal of blood from your body for the purpose of treating a medical condition. It is similar to donating blood. Usually, about a pint (470 mL) of blood is removed. The average adult has 9 to 12 pints (4.3 to 5.7 L) of blood. Therapeutic phlebotomy may be used to treat the following medical conditions:  Hemochromatosis. This is a condition in which there is too much iron in the blood.   [name]. This is a condition in which there are too many red cells in the blood.   Porphyria cutanea tarda. This is a disease usually passed from one generation to the next (inherited). It is a condition in which an important part of hemoglobin is not made properly. This results in the build up of abnormal amounts of porphyrins in the body.   Sickle cell disease. This is an inherited disease. It is a condition in which the red blood cells form an abnormal crescent shape rather than a round shape.  LET YOUR CAREGIVER KNOW ABOUT:  Allergies.   Medicines taken including herbs, eyedrops, over-the-counter medicines, and creams.   Use of steroids (by mouth or creams).   Previous problems with anesthetics or numbing medicine.   History of blood clots.   History of bleeding or blood problems.   Previous surgery.   Possibility of pregnancy, if this applies.  RISKS AND COMPLICATIONS This is a simple and safe procedure. Problems are unlikely. However, problems can occur and may include:  Nausea or lightheadedness.   Low blood pressure.   Soreness, bleeding, swelling, or bruising at the needle insertion site.   Infection.  BEFORE THE PROCEDURE  This is a procedure that can be done as an outpatient. Confirm the time that you need to arrive for your procedure. Confirm whether there is a need to fast or withhold any medications. It is helpful to wear clothing with sleeves that can be raised above the elbow. A blood sample may be done  to determine the amount of red blood cells or iron in your blood. Plan ahead of time to have someone drive you home after the procedure. PROCEDURE The entire procedure from preparation through recovery takes about 1 hour. The actual collection takes about 10 to 15 minutes.  A needle will be inserted into your vein.   Tubing and a collection bag will be attached to that needle.   Blood will flow through the needle and tubing into the collection bag.   You may be asked to open and close your hand slowly and continuously during the entire collection.   Once the specified amount of blood has been removed from your body, the collection bag and tubing will be clamped.   The needle will be removed.   Pressure will be held on the site of the needle insertion to stop the bleeding. Then a bandage will be placed over the needle insertion site.  AFTER THE PROCEDURE  Your recovery will be assessed and monitored. If there are no problems, as an outpatient, you should be able to go home shortly after the procedure.  Document Released: 03/08/2011 Document Revised: 09/23/2011 Document Reviewed: 03/08/2011 [facility] Patient Information 2012 [place], [place].

## 2012-06-12 NOTE — Progress Notes (Signed)
20g x 1" angio catheter with empty bag method used for phlebotomy today.  Lt. Upper medial forearm accessed used at 2nd attempt. HL Removed 365 ml by phlebotomy.  Lost IV.  Per Dr Lamonte Sakai - do not have to stick her again.  Pt tolerated treatment well.  Discharged to home with husband.

## 2012-08-14 ENCOUNTER — Ambulatory Visit (HOSPITAL_BASED_OUTPATIENT_CLINIC_OR_DEPARTMENT_OTHER): Payer: Medicare Other

## 2012-08-14 ENCOUNTER — Telehealth: Payer: Self-pay | Admitting: *Deleted

## 2012-08-14 ENCOUNTER — Other Ambulatory Visit (HOSPITAL_BASED_OUTPATIENT_CLINIC_OR_DEPARTMENT_OTHER): Payer: Medicare Other | Admitting: Lab

## 2012-08-14 ENCOUNTER — Ambulatory Visit (HOSPITAL_BASED_OUTPATIENT_CLINIC_OR_DEPARTMENT_OTHER): Payer: Medicare Other | Admitting: Oncology

## 2012-08-14 ENCOUNTER — Encounter: Payer: Self-pay | Admitting: Oncology

## 2012-08-14 ENCOUNTER — Telehealth: Payer: Self-pay | Admitting: Oncology

## 2012-08-14 VITALS — BP 175/81 | HR 82 | Temp 98.7°F | Resp 22 | Ht 64.5 in | Wt 205.4 lb

## 2012-08-14 DIAGNOSIS — R197 Diarrhea, unspecified: Secondary | ICD-10-CM

## 2012-08-14 DIAGNOSIS — D45 Polycythemia vera: Secondary | ICD-10-CM

## 2012-08-14 DIAGNOSIS — E119 Type 2 diabetes mellitus without complications: Secondary | ICD-10-CM

## 2012-08-14 DIAGNOSIS — D751 Secondary polycythemia: Secondary | ICD-10-CM

## 2012-08-14 DIAGNOSIS — I10 Essential (primary) hypertension: Secondary | ICD-10-CM

## 2012-08-14 LAB — COMPREHENSIVE METABOLIC PANEL (CC13)
AST: 45 U/L — ABNORMAL HIGH (ref 5–34)
Albumin: 4 g/dL (ref 3.5–5.0)
BUN: 8 mg/dL (ref 7.0–26.0)
CO2: 23 mEq/L (ref 22–29)
Calcium: 9.8 mg/dL (ref 8.4–10.4)
Chloride: 108 mEq/L — ABNORMAL HIGH (ref 98–107)
Potassium: 4.2 mEq/L (ref 3.5–5.1)

## 2012-08-14 LAB — CBC WITH DIFFERENTIAL/PLATELET
Basophils Absolute: 0.1 10*3/uL (ref 0.0–0.1)
EOS%: 2.2 % (ref 0.0–7.0)
HCT: 47.3 % — ABNORMAL HIGH (ref 34.8–46.6)
HGB: 15.9 g/dL (ref 11.6–15.9)
MCH: 32.5 pg (ref 25.1–34.0)
MCV: 96.5 fL (ref 79.5–101.0)
MONO%: 6.1 % (ref 0.0–14.0)
NEUT%: 74.1 % (ref 38.4–76.8)

## 2012-08-14 NOTE — Patient Instructions (Signed)
Therapeutic Phlebotomy Therapeutic phlebotomy is the controlled removal of blood from your body for the purpose of treating a medical condition. It is similar to donating blood. Usually, about a pint (470 mL) of blood is removed. The average adult has 9 to 12 pints (4.3 to 5.7 L) of blood. Therapeutic phlebotomy may be used to treat the following medical conditions:  Hemochromatosis. This is a condition in which there is too much iron in the blood.  [name]. This is a condition in which there are too many red cells in the blood.  Porphyria cutanea tarda. This is a disease usually passed from one generation to the next (inherited). It is a condition in which an important part of hemoglobin is not made properly. This results in the build up of abnormal amounts of porphyrins in the body.  Sickle cell disease. This is an inherited disease. It is a condition in which the red blood cells form an abnormal crescent shape rather than a round shape. LET YOUR CAREGIVER KNOW ABOUT:  Allergies.  Medicines taken including herbs, eyedrops, over-the-counter medicines, and creams.  Use of steroids (by mouth or creams).  Previous problems with anesthetics or numbing medicine.  History of blood clots.  History of bleeding or blood problems.  Previous surgery.  Possibility of pregnancy, if this applies. RISKS AND COMPLICATIONS This is a simple and safe procedure. Problems are unlikely. However, problems can occur and may include:  Nausea or lightheadedness.  Low blood pressure.  Soreness, bleeding, swelling, or bruising at the needle insertion site.  Infection. BEFORE THE PROCEDURE  This is a procedure that can be done as an outpatient. Confirm the time that you need to arrive for your procedure. Confirm whether there is a need to fast or withhold any medications. It is helpful to wear clothing with sleeves that can be raised above the elbow. A blood sample may be done to determine the  amount of red blood cells or iron in your blood. Plan ahead of time to have someone drive you home after the procedure. PROCEDURE The entire procedure from preparation through recovery takes about 1 hour. The actual collection takes about 10 to 15 minutes.  A needle will be inserted into your vein.  Tubing and a collection bag will be attached to that needle.  Blood will flow through the needle and tubing into the collection bag.  You may be asked to open and close your hand slowly and continuously during the entire collection.  Once the specified amount of blood has been removed from your body, the collection bag and tubing will be clamped.  The needle will be removed.  Pressure will be held on the site of the needle insertion to stop the bleeding. Then a bandage will be placed over the needle insertion site. AFTER THE PROCEDURE  Your recovery will be assessed and monitored. If there are no problems, as an outpatient, you should be able to go home shortly after the procedure.  Document Released: 03/08/2011 Document Revised: 12/27/2011 Document Reviewed: 03/08/2011 [facility] Patient Information 2013 [place].

## 2012-08-14 NOTE — Progress Notes (Signed)
1020- No additional blood return from phlebotomy site.  72g blood removed.  Myrtha Mantis, PA paged to notify her-dhp, rn 1040- per Erasmo Downer, will attempt to restick-dhp, rn 1150-Pt feels well, no c/o.  D/c'd home-dhp, rn

## 2012-08-14 NOTE — Telephone Encounter (Signed)
Printed and gv pt NOV schedule ...the patient aware to come pick up new schedule at [facility] visit...emailed Sharyn Lull to add phlebotomy.

## 2012-08-14 NOTE — Progress Notes (Signed)
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   CURRENT THERAPY: Phlebotomy once every 2 months for Hgb > 45; and hydroxyurea 568m PO BID.  INTERVAL HISTORY: Amanda Tyer89y.o. female returns for regular follow up with her husband.  She has been taking Hydrea 5024mPO BID without problem.  She denies skin rash, nausea/vomiting.  She denies SOB, chest pain, leg/calf swelling/pain; focal motor weakness. She was recently started on medication for IBS by her PCP.  Patient denies fatigue, headache, visual changes, confusion, drenching night sweats, palpable lymph node swelling, mucositis, odynophagia, dysphagia, nausea vomiting, jaundice, chest pain, palpitation, shortness of breath, dyspnea on exertion, productive cough, gum bleeding, epistaxis, hematemesis, hemoptysis, abdominal pain, abdominal swelling, early satiety, melena, hematochezia, hematuria, skin rash, spontaneous bleeding, joint swelling, joint pain, heat or cold intolerance, bowel bladder incontinence, back pain, focal motor weakness, paresthesia, depression, suicidal or homocidal ideation, feeling hopelessness.   Past Medical History  Diagnosis Date  . Hypertension   . Hyperlipidemia   . Diabetes mellitus 2008  . Depression   . Obesity   . Psoriasis   . Polycythemia     [facility]    Past Surgical History  Procedure Date  . Breast surgery     milk duct  . Total abdominal hysterectomy w/ bilateral salpingoophorectomy     for heavy periods    Current Outpatient Prescriptions  Medication Sig Dispense Refill  . acetaminophen-codeine (TYLENOL #3) 300-30 MG per tablet Take 1-2 tablets by mouth 3 (three) times daily.        . Marland Kitchencyclovir (ZOVIRAX) 400 MG tablet Take 400 mg by mouth 3 (three) times daily.        . Marland Kitchenspirin 81 MG EC tablet Take 81 mg by mouth daily.          . Marland KitchenLUoxetine (PROZAC) 40 MG capsule Take 40 mg by mouth every morning.        . gabapentin (NEURONTIN) 300 MG capsule Take 600 mg by mouth at bedtime.        . hydrocortisone 0.5 % ointment Apply topically as directed. Apply to R external ear bid X 10 days       . hydroxyurea (HYDREA) 500 MG capsule Take by mouth daily.        . insulin aspart protamine-insulin aspart (NOVOLOG 70/30) (70-30) 100 UNIT/ML injection Inject 40 Units into the skin 3 (three) times daily before meals. 40 units at breakfast and lunch and 60 units at dinner      . ketoconazole (NIZORAL) 2 % cream Apply topically 2 (two) times daily.        . Marland Kitchenisinopril-hydrochlorothiazide (PRINZIDE,ZESTORETIC) 20-12.5 MG per tablet Take 2 tablets by mouth every morning.        . metoprolol (LOPRESSOR) 100 MG tablet Take 100 mg by mouth 2 (two) times daily.        . pravastatin (PRAVACHOL) 80 MG tablet Take 80 mg by mouth at bedtime.        . Marland KitchenROAIR HFA 108 (90 BASE) MCG/ACT inhaler       . sitaGLIPtan-metformin (JANUMET) 50-500 MG per tablet Take 1 tablet by mouth daily with dinner.        . triamcinolone (KENALOG) 0.1 % cream Apply topically as directed. Apply to psoriasis bid X 10 days         ALLERGIES:  is  allergic to doxycycline and latex.  REVIEW OF SYSTEMS:  The rest of the 14-point review of system was negative.   There were no vitals filed for this visit. Wt Readings from Last 3 Encounters:  02/10/12 207 lb (93.895 kg)  09/13/11 212 lb (96.163 kg)  05/13/11 207 lb 1.6 oz (93.94 kg)   ECOG Performance status: 0  PHYSICAL EXAMINATION:   General:  Mildly obese woman in no acute distress.  Eyes:  no scleral icterus.  ENT:  There were no oropharyngeal lesions.  Neck was without thyromegaly.  Lymphatics:  Negative cervical, supraclavicular or axillary adenopathy.  Respiratory: lungs were clear bilaterally without wheezing or crackles.  Cardiovascular:  Regular rate and rhythm, S1/S2, without murmur, rub or gallop.  There was  no pedal edema.  GI:  abdomen was soft, flat, nontender, nondistended, without organomegaly.  Muscoloskeletal:  no spinal tenderness of palpation of vertebral spine.  Skin exam was without echymosis, petichae.  Neuro exam was nonfocal.  Patient was able to get on and off exam table without assistance.  Gait was normal.  Patient was alerted and oriented.  Attention was good.   Language was appropriate.  Mood was normal without depression.  Speech was not pressured.  Thought content was not tangential.      LABORATORY/RADIOLOGY DATA:  Lab Results  Component Value Date   WBC 12.5* 08/14/2012   HGB 15.9 08/14/2012   HCT 47.3* 08/14/2012   PLT 427* 08/14/2012   GLUCOSE 257* 09/13/2011   GLUCOSE 257* 09/13/2011   CHOL 211* 11/16/2010   TRIG 321* 11/16/2010   HDL 31* 11/16/2010   LDLCALC 116* 11/16/2010   ALKPHOS 82 09/13/2011   ALKPHOS 82 09/13/2011   ALT 25 09/13/2011   ALT 25 09/13/2011   AST 38* 09/13/2011   AST 38* 09/13/2011   NA 138 09/13/2011   NA 138 09/13/2011   K 4.3 09/13/2011   K 4.3 09/13/2011   CL 104 09/13/2011   CL 104 09/13/2011   CREATININE 0.74 09/13/2011   CREATININE 0.74 09/13/2011   BUN 13 09/13/2011   BUN 13 09/13/2011   CO2 25 09/13/2011   CO2 25 09/13/2011   HGBA1C 12.8 11/16/2010     ASSESSMENT AND PLAN:  1. JAK-2 negative PV: Her Hgb/Hct is at goal <45%.  She will proceed with phlebotomy today.  She does have slight leukocytosis and thrombocytosis as part of myeloproliferative disease presentation; however, they are not severely elevated.  I have recommended that she increase Hydrea to 1000 mg in the am and 500 mg in the pm. She has not had any thrombotic complication.  I advised her to continue ASA to also decrease the risk of clot.   2. Diarrhea:  Recent diagnosis of IBS and was started on medication for this. Her last colonoscopy with Dr. Oletta Lamas in 2012 showed some polyps and she's due for the next one in 2014.   3. HTN: She is on Metoprolol;  Lilsinopril/HCTZ with good BP control per PCP.   4. DM: She is on insulin, metformin, and  Victoza per PCP.   5. HLP: She is on Pravastatin per PCP.   6. Age appropriate cancer screening: Her last mammogram in 02/2011 was negative per her report. She will make an appt for her routine mammogram as she is now overdue.  7. Follow up: CBC in 1 month then q66monthCBC and phlebotomy for Hct >45. We'll see her in 6 months.

## 2012-08-14 NOTE — Patient Instructions (Addendum)
Proceed with phlebotomy today.  Increase Hydrea to 1000 mg in the AM and 500 mg in the PM.   Recheck labs in 1 month, 2 months, 4 months, and 6 months. Possible phlebotomy in 2, 4, & 6 months.  Visit in 6 months

## 2012-08-14 NOTE — Telephone Encounter (Signed)
Per staff message and POF I have scheduled appts.  JMW  

## 2012-09-11 ENCOUNTER — Telehealth: Payer: Self-pay | Admitting: *Deleted

## 2012-09-11 ENCOUNTER — Other Ambulatory Visit (HOSPITAL_BASED_OUTPATIENT_CLINIC_OR_DEPARTMENT_OTHER): Payer: Medicare Other | Admitting: Lab

## 2012-09-11 DIAGNOSIS — D751 Secondary polycythemia: Secondary | ICD-10-CM

## 2012-09-11 LAB — CBC WITH DIFFERENTIAL/PLATELET
EOS%: 2.9 % (ref 0.0–7.0)
Eosinophils Absolute: 0.3 10*3/uL (ref 0.0–0.5)
LYMPH%: 20.2 % (ref 14.0–49.7)
MCH: 33 pg (ref 25.1–34.0)
MCV: 97.9 fL (ref 79.5–101.0)
MONO%: 5.5 % (ref 0.0–14.0)
NEUT#: 6.7 10*3/uL — ABNORMAL HIGH (ref 1.5–6.5)
Platelets: 384 10*3/uL (ref 145–400)
RBC: 4.37 10*6/uL (ref 3.70–5.45)

## 2012-09-11 NOTE — Telephone Encounter (Signed)
Message copied by Maudie Mercury on Mon Sep 11, 2012  1:13 PM ------      Message from: Mikey Bussing R      Created: Mon Sep 11, 2012 10:41 AM       Call pt. Hgb and Plt are normal. Continue current dose of Hydrea.

## 2012-09-11 NOTE — Telephone Encounter (Signed)
Called pt at home number and left VM. Called cell phone and her husband answered.  Gave him message that her Hgb and Platelets are wnl and to continue current dose of hydrea.  Keep appt next month as scheduled and call back if any questions.  He verbalized understanding.

## 2012-10-09 ENCOUNTER — Ambulatory Visit (HOSPITAL_BASED_OUTPATIENT_CLINIC_OR_DEPARTMENT_OTHER): Payer: Medicare Other

## 2012-10-09 ENCOUNTER — Other Ambulatory Visit (HOSPITAL_BASED_OUTPATIENT_CLINIC_OR_DEPARTMENT_OTHER): Payer: Medicare Other | Admitting: Lab

## 2012-10-09 DIAGNOSIS — D45 Polycythemia vera: Secondary | ICD-10-CM

## 2012-10-09 DIAGNOSIS — D751 Secondary polycythemia: Secondary | ICD-10-CM

## 2012-10-09 NOTE — Progress Notes (Signed)
Therapeutic phlebotomy not indicated, HCT 42.4 today.  Copy of labs given to pt, will keep appt in [place], rn

## 2012-10-24 ENCOUNTER — Emergency Department (HOSPITAL_COMMUNITY): Payer: Medicare Other

## 2012-10-24 ENCOUNTER — Encounter (HOSPITAL_COMMUNITY): Payer: Self-pay | Admitting: *Deleted

## 2012-10-24 ENCOUNTER — Emergency Department (HOSPITAL_COMMUNITY)
Admission: EM | Admit: 2012-10-24 | Discharge: 2012-10-25 | Disposition: A | Discharge status: Home / Self Care | Payer: Medicare Other | Attending: Emergency Medicine | Admitting: Emergency Medicine

## 2012-10-24 DIAGNOSIS — Z794 Long term (current) use of insulin: Secondary | ICD-10-CM | POA: Insufficient documentation

## 2012-10-24 DIAGNOSIS — E669 Obesity, unspecified: Secondary | ICD-10-CM | POA: Insufficient documentation

## 2012-10-24 DIAGNOSIS — Z7982 Long term (current) use of aspirin: Secondary | ICD-10-CM | POA: Insufficient documentation

## 2012-10-24 DIAGNOSIS — Z8619 Personal history of other infectious and parasitic diseases: Secondary | ICD-10-CM | POA: Insufficient documentation

## 2012-10-24 DIAGNOSIS — G510 Bell's palsy: Secondary | ICD-10-CM | POA: Insufficient documentation

## 2012-10-24 DIAGNOSIS — E119 Type 2 diabetes mellitus without complications: Secondary | ICD-10-CM | POA: Insufficient documentation

## 2012-10-24 DIAGNOSIS — I10 Essential (primary) hypertension: Secondary | ICD-10-CM | POA: Insufficient documentation

## 2012-10-24 DIAGNOSIS — Z87891 Personal history of nicotine dependence: Secondary | ICD-10-CM | POA: Insufficient documentation

## 2012-10-24 DIAGNOSIS — Z8659 Personal history of other mental and behavioral disorders: Secondary | ICD-10-CM | POA: Insufficient documentation

## 2012-10-24 DIAGNOSIS — E785 Hyperlipidemia, unspecified: Secondary | ICD-10-CM | POA: Insufficient documentation

## 2012-10-24 DIAGNOSIS — Z79899 Other long term (current) drug therapy: Secondary | ICD-10-CM | POA: Insufficient documentation

## 2012-10-24 HISTORY — DX: Herpesviral infection, unspecified: B00.9

## 2012-10-24 LAB — CBC WITH DIFFERENTIAL/PLATELET
Basophils Absolute: 0.1 10*3/uL (ref 0.0–0.1)
Eosinophils Absolute: 0.4 10*3/uL (ref 0.0–0.7)
Eosinophils Relative: 3 % (ref 0–5)
Lymphocytes Relative: 19 % (ref 12–46)
MCH: 33.9 pg (ref 26.0–34.0)
MCV: 97.5 fL (ref 78.0–100.0)
Platelets: 479 10*3/uL — ABNORMAL HIGH (ref 150–400)
RDW: 14.6 % (ref 11.5–15.5)
WBC: 13.8 10*3/uL — ABNORMAL HIGH (ref 4.0–10.5)

## 2012-10-24 LAB — BASIC METABOLIC PANEL
Calcium: 9.8 mg/dL (ref 8.4–10.5)
GFR calc non Af Amer: 87 mL/min — ABNORMAL LOW (ref >90)
Sodium: 136 mEq/L (ref 135–145)

## 2012-10-24 NOTE — ED Notes (Signed)
pts husband remains upset about the wait.  He states that nobody here  Knows nothing.  He would be bettter off at an animal [facility].  He should have brought his wife in by ambulance she would have been seen  Sooner per the husband.  No explanation is sufficient for him

## 2012-10-24 NOTE — ED Notes (Signed)
pts husband making threats to walk out.  He  Is also threatening to sue if he takes his wife home because he does not want to wait and she has a stroke.

## 2012-10-24 NOTE — ED Notes (Signed)
Patient states her symptoms started last night around 10pm with a sudden onset headache with left facial pain. Today she states her left side of her face developed facial droop with expressive aphasia. She also states she had difficulty ambulating. She called her family physician and presented there where they immediately referred her to the ER. Patient still has left side facial droop with with left side facial pain.

## 2012-10-24 NOTE — ED Notes (Signed)
Advised of the wait time

## 2012-10-24 NOTE — ED Notes (Signed)
Pt states on Saturday leftt eye was tearing.  Pt was treated for pink eye.  Last nite noticed swelling under left eye and noticed left facial droop.  Yesterday got dizzy and almost passed out.  Pt feels like her talking sounds different.   Pt states that she is having pain to left side of face too.  No dizziness now.

## 2012-10-24 NOTE — ED Notes (Signed)
pts husband back to desk  .  His wife has not eaten since this am.  She needs to eat something according to the  husband

## 2012-10-25 DIAGNOSIS — G510 Bell's palsy: Secondary | ICD-10-CM

## 2012-10-25 MED ORDER — VALACYCLOVIR HCL 1 G PO TABS: 1000.0000 mg | ORAL_TABLET | Freq: Three times a day (TID) | ORAL | Status: AC

## 2012-10-25 MED ORDER — PREDNISONE 20 MG PO TABS: 40.0000 mg | ORAL_TABLET | Freq: Two times a day (BID) | ORAL | Status: DC

## 2012-10-25 MED ORDER — TRAMADOL HCL 50 MG PO TABS: 50.0000 mg | ORAL_TABLET | Freq: Four times a day (QID) | ORAL | Status: DC | PRN

## 2012-10-25 NOTE — Consult Note (Signed)
CC: [facility]  HPI: Amanda [name] is an 72 y.o. female R handed with below pmhx presents with 1 day hx of L facial pain followed by L facial droop as well as inability to close her eye. Pt initially went to see her primary physician who told her that she has conjunctivitis. She has a herpes outbreak lesion on her hand.  Pt also complains of dysarthria.  Denies any focal motor/sensory/visual deficits. First time of such event.   Past Medical History  Diagnosis Date  . Hypertension   . Hyperlipidemia   . Diabetes mellitus 2008  . Depression   . Obesity   . Psoriasis   . Polycythemia     Dr. Elease Hashimoto- [facility]  . Herpes simplex     Past Surgical History  Procedure Date  . Breast surgery     milk duct  . Total abdominal hysterectomy w/ bilateral salpingoophorectomy     for heavy periods  . Tonsillectomy     Family History  Problem Relation Age of Onset  . Diabetes Mother   . Heart disease Mother     CAD  . Hyperlipidemia Mother   . Hypertension Mother   . Kidney disease Mother   . Kidney disease Father   . Cancer Other     breast    Social History:  reports that she quit smoking about 21 years ago. She does not have any smokeless tobacco history on file. She reports that she does not drink alcohol or use illicit drugs. Lives with her husband  Allergies  Allergen Reactions  . Doxycycline   . Latex     Medications: I have reviewed the patient's current medications.  ROS: History obtained from the patient  General ROS: negative for - chills, fatigue, fever, night sweats, weight gain or weight loss Psychological ROS: negative for - behavioral disorder, hallucinations, memory difficulties, mood swings or suicidal ideation Ophthalmic ROS: negative for - blurry vision, double vision, eye pain or loss of vision ENT ROS: negative for - epistaxis, nasal discharge, oral lesions, sore throat, tinnitus or vertigo Allergy and Immunology ROS: negative for - hives or  itchy/watery eyes Hematological and Lymphatic ROS: negative for - bleeding problems, bruising or swollen lymph nodes Endocrine ROS: negative for - galactorrhea, hair pattern changes, polydipsia/polyuria or temperature intolerance Respiratory ROS: negative for - cough, hemoptysis, shortness of breath or wheezing Cardiovascular ROS: negative for - chest pain, dyspnea on exertion, edema or irregular heartbeat Gastrointestinal ROS: negative for - abdominal pain, diarrhea, hematemesis, nausea/vomiting or stool incontinence Genito-Urinary ROS: negative for - dysuria, hematuria, incontinence or urinary frequency/urgency Musculoskeletal ROS: negative for - joint swelling or muscular weakness Neurological ROS: as noted in HPI Dermatological ROS: negative for rash and skin lesion changes   Physical Examination: Blood pressure 158/67, pulse 91, temperature 98 F (36.7 C), temperature source Oral, resp. rate 21, SpO2 94.00%.  Neurologic Examination Mental Status: Alert, oriented, thought content appropriate.  Dysatharic speech noted.  Able to follow 3 step commands without difficulty. Cranial Nerves: II: Discs flat bilaterally; Visual fields grossly normal, pupils equal, round, reactive to light and accommodation III,IV, VI: ptosis not present, extra-ocular motions intact bilaterally V,VII: L facial droop, inability to completely close L eye lid.  VIII: hearing normal bilaterally IX,X: gag reflex present XI: bilateral shoulder shrug XII: midline tongue extension Motor: Right : Upper extremity   5/5    Left:     Upper extremity   5/5  Lower extremity   5/5  Lower extremity   5/5 Tone and bulk:normal tone throughout; no atrophy noted Sensory: Pinprick and light touch intact throughout, bilaterally Deep Tendon Reflexes: 2+ and symmetric throughout Plantars: Right: downgoing   Left: downgoing Cerebellar: normal finger-to-nose, normal rapid alternating movements and normal heel-to-shin  test Gait: normal gait and station CV: pulses palpable throughout      Laboratory Studies:   Basic Metabolic Panel:  Lab 19/75/88 1535  NA 136  K 4.0  CL 99  CO2 24  GLUCOSE 240*  BUN 9  CREATININE 0.62  CALCIUM 9.8  MG --  PHOS --    Liver Function Tests: No results found for this basename: AST:5,ALT:5,ALKPHOS:5,BILITOT:5,PROT:5,ALBUMIN:5 in the last 168 hours No results found for this basename: LIPASE:5,AMYLASE:5 in the last 168 hours No results found for this basename: AMMONIA:3 in the last 168 hours  CBC:  Lab 10/24/12 1535  WBC 13.8*  NEUTROABS 9.6*  HGB 15.2*  HCT 43.7  MCV 97.5  PLT 479*    Cardiac Enzymes: No results found for this basename: CKTOTAL:5,CKMB:5,CKMBINDEX:5,TROPONINI:5 in the last 168 hours  BNP: No components found with this basename: POCBNP:5  CBG: No results found for this basename: GLUCAP:5 in the last 168 hours  Microbiology: Results for orders placed in visit on 04/11/12  TECHNOLOGIST REVIEW     Status: Normal   Collection Time   04/11/12  8:03 AM      Component Value Range Status Comment   Technologist Review Rare meta and myelocyte   Final     Coagulation Studies: No results found for this basename: LABPROT:5,INR:5 in the last 72 hours  Urinalysis: No results found for this basename: COLORURINE:2,APPERANCEUR:2,LABSPEC:2,PHURINE:2,GLUCOSEU:2,HGBUR:2,BILIRUBINUR:2,KETONESUR:2,PROTEINUR:2,UROBILINOGEN:2,NITRITE:2,LEUKOCYTESUR:2 in the last 168 hours  Lipid Panel:     Component Value Date/Time   CHOL 211* 11/16/2010 2056   TRIG 321* 11/16/2010 2056   HDL 31* 11/16/2010 2056   CHOLHDL 6.8 Ratio 11/16/2010 2056   VLDL 64* 11/16/2010 2056   [place] 116* 11/16/2010 2056    HgbA1C:  Lab Results  Component Value Date   HGBA1C 12.8 11/16/2010    Urine Drug Screen:   No results found for this basename: labopia, cocainscrnur, labbenz, amphetmu, thcu, labbarb    Alcohol Level: No results found for this basename: ETH:2 in the last  168 hours    Imaging: Ct Head Wo Contrast  10/24/2012  *RADIOLOGY REPORT*  Clinical Data: Right facial numbness for 4 days.  CT HEAD WITHOUT CONTRAST  Technique:  Contiguous axial images were obtained from the base of the skull through the vertex without contrast.  Comparison: None.  Findings: There is some chronic microvascular ischemic change.  No evidence of acute abnormality including infarct, hemorrhage, mass lesion, mass effect, midline shift or abnormal extra-axial fluid collection.  No hydrocephalus or pneumocephalus.  Calvarium intact.  IMPRESSION: No acute finding.   Original Report Authenticated By: Orlean Patten, M.D.      Assessment/Plan:  72 y/o female with new onset of Bell's palsy and possibly conjunctivitis. - Acyclovir - Medrol pack - Lacrilube for possibility of dry eyes - Eye patch to wear at night.    Patient Active Hospital Problem List: No active hospital problems.    10/25/2012, 12:11 AM

## 2012-10-25 NOTE — ED Provider Notes (Signed)
Medical screening examination/treatment/procedure(s) were performed by non-physician practitioner and as supervising physician I was immediately available for consultation/collaboration.  [name]. Alvino Chapel, MD 10/25/12 2333

## 2012-10-25 NOTE — ED Provider Notes (Signed)
History     CSN: [id number]  Arrival date & time 10/24/12  1515   First MD Initiated Contact with Patient 10/25/12 0018      No chief complaint on file.   (Consider location/radiation/quality/duration/timing/severity/associated sxs/prior treatment) HPI History provided by pt.   Pt reports that she developed tearing of L eye 2 days ago.  Last night she noticed left-sided facial droop while looking in the mirror.  Associated w/ left cheek pain and drooling as well as expressive aphasia.  Denies fever, facial rash, change in vision/hearing/taste, extremity weakness/paresthesias.  Her husband reports baseline mentation.  Was evaluated by her PCP this morning, who referred her to ED for head CT.  She recently broke out into a herpetic rash on hands; first breakout in 21 years.  Denies head trauma.   Past Medical History  Diagnosis Date  . Hypertension   . Hyperlipidemia   . Diabetes mellitus 2008  . Depression   . Obesity   . Psoriasis   . Polycythemia     Dr. Elease Hashimoto- [facility]  . Herpes simplex     Past Surgical History  Procedure Date  . Breast surgery     milk duct  . Total abdominal hysterectomy w/ bilateral salpingoophorectomy     for heavy periods  . Tonsillectomy     Family History  Problem Relation Age of Onset  . Diabetes Mother   . Heart disease Mother     CAD  . Hyperlipidemia Mother   . Hypertension Mother   . Kidney disease Mother   . Kidney disease Father   . Cancer Other     breast    History  Substance Use Topics  . Smoking status: Former Smoker    Quit date: 10/19/1991  . Smokeless tobacco: Not on file  . Alcohol Use: No    OB History    Grav Para Term Preterm Abortions TAB SAB Ect Mult Living                  Review of Systems  All other systems reviewed and are negative.    Allergies  Doxycycline and Latex  Home Medications   Current Outpatient Rx  Name  Route  Sig  Dispense  Refill  . ASPIRIN 81 MG PO TBEC   Oral   Take  81 mg by mouth daily.           Marland Kitchen VITAMIN D 1000 UNITS PO TABS   Oral   Take 1,000 Units by mouth daily.         . ERYTHROMYCIN 5 MG/GM OP OINT   Left Eye   Place 1 application into the left eye every 4 (four) hours as needed. For pink eye         . HYDROXYUREA 500 MG PO CAPS   Oral   Take 500 mg by mouth 2 (two) times daily.          . INSULIN ASPART PROT & ASPART (70-30) 100 UNIT/ML [place] SUSP   Subcutaneous   Inject 40 Units into the skin 3 (three) times daily before meals. 50 units at breakfast, 40 units at lunch, and 70 at night         . LIRAGLUTIDE 18 MG/3ML [place] SOLN   Subcutaneous   Inject 1.8 mg into the skin at bedtime.         Marland Kitchen LISINOPRIL-HYDROCHLOROTHIAZIDE 20-12.5 MG PO TABS   Oral   Take 1 tablet by mouth daily.          Marland Kitchen  METOPROLOL TARTRATE 100 MG PO TABS   Oral   Take 100 mg by mouth 2 (two) times daily.           Marland Kitchen PRAVASTATIN SODIUM 80 MG PO TABS   Oral   Take 40 mg by mouth daily.          Marland Kitchen PREDNISONE 20 MG PO TABS   Oral   Take 2 tablets (40 mg total) by mouth 2 (two) times daily.   28 tablet   0   . TRAMADOL HCL 50 MG PO TABS   Oral   Take 1 tablet (50 mg total) by mouth every 6 (six) hours as needed for pain.   15 tablet   0   . VALACYCLOVIR HCL 1 G PO TABS   Oral   Take 1 tablet (1,000 mg total) by mouth 3 (three) times daily.   21 tablet   0     BP 143/69  Pulse 86  Temp 98 F (36.7 C) (Oral)  Resp 24  SpO2 93%  Physical Exam  Nursing note and vitals reviewed. Constitutional: She is oriented to person, place, and time. She appears well-developed and well-nourished. No distress.  HENT:  Head: Normocephalic and atraumatic.  Eyes:       Normal appearance  Neck: Normal range of motion.  Cardiovascular: Normal rate, regular rhythm and intact distal pulses.   Pulmonary/Chest: Effort normal and breath sounds normal.  Musculoskeletal: Normal range of motion.  Neurological: She is alert and oriented to person,  place, and time. No sensory deficit. Coordination normal.       CN 3-12 intact w/ exception of left facial droop (not sparing the forehead).  No nystagmus. 5/5 and equal upper and lower extremity strength.  No past pointing.     Skin: Skin is warm and dry. No rash noted.  Psychiatric: She has a normal mood and affect. Her behavior is normal.    ED Course  Procedures (including critical care time)  Labs Reviewed  CBC WITH DIFFERENTIAL - Abnormal; Notable for the following:    WBC 13.8 (*)     Hemoglobin 15.2 (*)     Platelets 479 (*)     Neutro Abs 9.6 (*)     All other components within normal limits  BASIC METABOLIC PANEL - Abnormal; Notable for the following:    Glucose, Bld 240 (*)     GFR calc non Af Amer 87 (*)     All other components within normal limits  LAB REPORT - SCANNED   Ct Head Wo Contrast  10/24/2012  *RADIOLOGY REPORT*  Clinical Data: Right facial numbness for 4 days.  CT HEAD WITHOUT CONTRAST  Technique:  Contiguous axial images were obtained from the base of the skull through the vertex without contrast.  Comparison: None.  Findings: There is some chronic microvascular ischemic change.  No evidence of acute abnormality including infarct, hemorrhage, mass lesion, mass effect, midline shift or abnormal extra-axial fluid collection.  No hydrocephalus or pneumocephalus.  Calvarium intact.  IMPRESSION: No acute finding.   Original Report Authenticated By: Orlean Patten, M.D.      1. Bell's palsy       MDM  72yo F presents w/ L-sided facial droop.  CT head neg and s/sx most consistent w/ Bell's Palsy, with exception of lacrimation rather than dry eye on affected side, c/o facial pain and expressive aphasia and failed swallow screen in ED.  Neuro consulted and agree that she has Bell's Palsy.  Discharged home w/ valtrex (herpetic lesions on hands), prednisone and ultram.  I advised f/u with her PCP. Return precautions discussed.        Remer Macho,  PA-C 10/25/12 2123

## 2012-12-02 ENCOUNTER — Other Ambulatory Visit: Payer: Self-pay

## 2012-12-04 ENCOUNTER — Telehealth: Payer: Self-pay | Admitting: *Deleted

## 2012-12-04 ENCOUNTER — Other Ambulatory Visit: Payer: Self-pay | Admitting: Oncology

## 2012-12-04 ENCOUNTER — Ambulatory Visit: Payer: Medicare Other

## 2012-12-04 ENCOUNTER — Other Ambulatory Visit (HOSPITAL_BASED_OUTPATIENT_CLINIC_OR_DEPARTMENT_OTHER): Payer: Medicare Other | Admitting: Lab

## 2012-12-04 DIAGNOSIS — D751 Secondary polycythemia: Secondary | ICD-10-CM

## 2012-12-04 DIAGNOSIS — D45 Polycythemia vera: Secondary | ICD-10-CM

## 2012-12-04 LAB — CBC WITH DIFFERENTIAL/PLATELET
Basophils Absolute: 0.2 10*3/uL — ABNORMAL HIGH (ref 0.0–0.1)
EOS%: 2.6 % (ref 0.0–7.0)
HCT: 44.8 % (ref 34.8–46.6)
HGB: 15.2 g/dL (ref 11.6–15.9)
MCH: 33 pg (ref 25.1–34.0)
MCV: 97.2 fL (ref 79.5–101.0)
MONO%: 4.6 % (ref 0.0–14.0)
NEUT%: 73.6 % (ref 38.4–76.8)

## 2012-12-04 NOTE — Telephone Encounter (Signed)
Message copied by Randolm Idol on Mon Dec 04, 2012  4:32 PM ------      Message from: Aliene Altes C      Created: Mon Dec 04, 2012  2:40 PM                   ----- Message -----         From: Maryanna Shape, NP         Sent: 12/04/2012   2:29 PM           To: Aleene Davidson, RN            Please call pt. WBC and Plt are stable. No change in dose of Hydrea. Hct <45 and no phlebotomy at this time. ------

## 2012-12-04 NOTE — Progress Notes (Signed)
Patient in for possible phlebotomy, parameters for treatment>45%. Patient HCT=44.8. Patient encouraged to continue on Hydrea and keep appointments as scheduled.

## 2012-12-04 NOTE — Telephone Encounter (Signed)
Spoke with husband [name], told him, wife's WBC and PLT are normal, no change in hydrea, hct <45, so no phlebotomy at this time.

## 2013-01-01 ENCOUNTER — Other Ambulatory Visit: Payer: Self-pay | Admitting: Family Medicine

## 2013-01-01 DIAGNOSIS — E2839 Other primary ovarian failure: Secondary | ICD-10-CM

## 2013-02-05 ENCOUNTER — Telehealth: Payer: Self-pay | Admitting: *Deleted

## 2013-02-05 ENCOUNTER — Other Ambulatory Visit: Payer: Medicare Other | Admitting: Lab

## 2013-02-05 ENCOUNTER — Ambulatory Visit: Payer: Medicare Other | Admitting: Oncology

## 2013-02-05 NOTE — Telephone Encounter (Signed)
Per staff message and POF I have scheduled appts.  Amanda [name]  

## 2013-02-05 NOTE — Progress Notes (Signed)
No show.  Reminder letter sent.

## 2013-02-06 ENCOUNTER — Telehealth: Payer: Self-pay | Admitting: Oncology

## 2013-02-08 ENCOUNTER — Ambulatory Visit: Payer: Medicare Other

## 2013-02-08 ENCOUNTER — Other Ambulatory Visit: Payer: Medicare Other

## 2013-02-09 ENCOUNTER — Ambulatory Visit
Admission: RE | Admit: 2013-02-09 | Discharge: 2013-02-09 | Disposition: A | Discharge status: Home / Self Care | Payer: Medicare Other | Source: Ambulatory Visit | Attending: Family Medicine | Admitting: Family Medicine

## 2013-02-09 DIAGNOSIS — Z1231 Encounter for screening mammogram for malignant neoplasm of breast: Secondary | ICD-10-CM

## 2013-02-16 ENCOUNTER — Other Ambulatory Visit (HOSPITAL_BASED_OUTPATIENT_CLINIC_OR_DEPARTMENT_OTHER): Payer: Medicare Other | Admitting: Lab

## 2013-02-16 ENCOUNTER — Ambulatory Visit (HOSPITAL_BASED_OUTPATIENT_CLINIC_OR_DEPARTMENT_OTHER): Payer: Medicare Other

## 2013-02-16 ENCOUNTER — Telehealth: Payer: Self-pay | Admitting: Oncology

## 2013-02-16 ENCOUNTER — Ambulatory Visit (HOSPITAL_BASED_OUTPATIENT_CLINIC_OR_DEPARTMENT_OTHER): Payer: Medicare Other | Admitting: Oncology

## 2013-02-16 VITALS — BP 171/80 | HR 86 | Temp 96.9°F | Resp 18 | Ht 64.5 in | Wt 200.8 lb

## 2013-02-16 DIAGNOSIS — R197 Diarrhea, unspecified: Secondary | ICD-10-CM

## 2013-02-16 DIAGNOSIS — D751 Secondary polycythemia: Secondary | ICD-10-CM

## 2013-02-16 DIAGNOSIS — D45 Polycythemia vera: Secondary | ICD-10-CM

## 2013-02-16 DIAGNOSIS — E119 Type 2 diabetes mellitus without complications: Secondary | ICD-10-CM

## 2013-02-16 LAB — CBC WITH DIFFERENTIAL/PLATELET
EOS%: 3.2 % (ref 0.0–7.0)
MCH: 30.1 pg (ref 25.1–34.0)
MCHC: 32.7 g/dL (ref 31.5–36.0)
MCV: 92.1 fL (ref 79.5–101.0)
MONO%: 3.9 % (ref 0.0–14.0)
RBC: 5.1 10*6/uL (ref 3.70–5.45)
RDW: 13.7 % (ref 11.2–14.5)

## 2013-02-16 LAB — COMPREHENSIVE METABOLIC PANEL (CC13)
AST: 29 U/L (ref 5–34)
Albumin: 3.1 g/dL — ABNORMAL LOW (ref 3.5–5.0)
Alkaline Phosphatase: 118 U/L (ref 40–150)
BUN: 6.6 mg/dL — ABNORMAL LOW (ref 7.0–26.0)
Potassium: 3.8 mEq/L (ref 3.5–5.1)
Sodium: 137 mEq/L (ref 136–145)

## 2013-02-16 NOTE — Patient Instructions (Addendum)
Therapeutic Phlebotomy Care After Refer to this sheet in the next few weeks. These instructions provide you with information on caring for yourself after your procedure. Your caregiver may also give you more specific instructions. Your treatment has been planned according to current medical practices, but problems sometimes occur. Call your caregiver if you have any problems or questions after your procedure. HOME CARE INSTRUCTIONS Most people can go back to their normal activities right away. Before you leave, be sure to ask if there is anything you should or should not do. In general, it would be wise to:  Keep the bandage dry. You can remove the bandage after about 5 hours.  Eat well-balanced meals for the next 24 hours.  Drink enough fluids to keep your urine clear or pale yellow.  Avoid drinking alcohol minimally until after eating.  Avoid smoking for at least 30 minutes after the procedure.  Avoid strenous physical activity or heavy lifting or pulling for about 5 hours after the procedure.  Athletes should avoid strenous exercise for 12 hours or more.  Change positions slowly for the remainder of the day to prevent lightheadedness or fainting.  If you feel lightheaded, lie down until the feeling subsides.  If you have bleeding from the needle insertion site, elevate your arm and press firmly on the site until the bleeding stops.  If bruising or bleeding appears under the skin, apply ice to the area for 15 to 20 minutes, 3 to 4 times per day. Put the ice in a plastic bag and place a towel between the bag of ice and your skin. Do this while you are awake for the first 24 hours. The ice packs can be stopped before 24 hours if the swelling goes away. If swelling persists after 24 hours, a warm, moist washcloth can be applied to the area for 15 to 20 minutes, 3 to 4 times per day. The warm, moist treatments can be stopped when the swelling goes away.  It is important to continue further  therapeutic phlebotomy as directed by your caregiver. SEEK MEDICAL CARE IF:  There is bleeding or fluid leaking from the needle insertion site.  The needle insertion site becomes swollen, red, or sore.  You feel lightheaded, dizzy or nauseated, and the feeling does not go away.  You notice new bruising at the needle insertion site.  You feel more weak or tired than normal.  You develop a fever. SEEK IMMEDIATE MEDICAL CARE IF:   There is increased bleeding, pain, or swelling from the needle insertion site.  You have severe nausea or vomiting.  You have chest pain.  You have trouble breathing. MAKE SURE YOU:  Understand these instructions.  Will watch your condition.  Will get help right away if you are not doing well or get worse. Document Released: 03/08/2011 Document Revised: 12/27/2011 Document Reviewed: 03/08/2011 [facility] Patient Information 2013 [place].

## 2013-02-16 NOTE — Progress Notes (Signed)
Glucose = 396 today. Lab results given to Marcie Bal, [facility] @ Dr. Altheimer's office [phone]) and labs faxed to their office [phone])

## 2013-02-16 NOTE — Progress Notes (Signed)
[id number] phlebotomy of 512 cc from right medial antecubital with 18 g angiocath. Snack given. 30 min observation. Pt dc home stable.

## 2013-02-16 NOTE — Progress Notes (Signed)
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   CURRENT THERAPY: Phlebotomy once every 2 months for Hgb > 45; Aspirin 82m PO daily; and hydroxyurea 5051mPO BID.  INTERVAL HISTORY: [age]MERIN BORJON89.o. female returns for regular follow up.  She has been taking Hydrea 50026mO BID and aspirin without problem.  She denied headache, SOB, chest pain, leg swelling/pain.  She still has persistent diarrhea. She has seen 2 GI doctors without improvement of her diarrhea.  They assumed that she had IBS.  She has been having polydipsia.  She drinks up to 5L water a day.  Her Glc has been elevated despite her taking insulin.   The rest of the 14-point review of system was negative.    Past Medical History  Diagnosis Date  . Hypertension   . Hyperlipidemia   . Diabetes mellitus 2008  . Depression   . Obesity   . Psoriasis   . Polycythemia     Dr. WilElease HashimotoP hematology  . Herpes simplex     Past Surgical History  Procedure Laterality Date  . Breast surgery      milk duct  . Total abdominal hysterectomy w/ bilateral salpingoophorectomy      for heavy periods  . Tonsillectomy      Current Outpatient Prescriptions  Medication Sig Dispense Refill  . aspirin 81 MG EC tablet Take 81 mg by mouth daily.        . cholecalciferol (VITAMIN D) 1000 UNITS tablet Take 1,000 Units by mouth daily.      . eMarland Kitchenythromycin ophthalmic ointment Place 1 application into the left eye every 4 (four) hours as needed. For pink eye      . hydroxyurea (HYDREA) 500 MG capsule Take 500 mg by mouth 2 (two) times daily.       . insulin aspart protamine-insulin aspart (NOVOLOG 70/30) (70-30) 100 UNIT/ML injection Inject 40 Units into the skin 3 (three) times daily before meals. 50 units at breakfast, 40 units at lunch, and 70 at night      . Liraglutide (VICTOZA) 18 MG/3ML SOLN  Inject 1.8 mg into the skin at bedtime.      . lMarland Kitchensinopril-hydrochlorothiazide (PRINZIDE,ZESTORETIC) 20-12.5 MG per tablet Take 1 tablet by mouth daily.       . metoprolol (LOPRESSOR) 100 MG tablet Take 100 mg by mouth 2 (two) times daily.        . pravastatin (PRAVACHOL) 80 MG tablet Take 40 mg by mouth daily.       . traMADol (ULTRAM) 50 MG tablet Take 1 tablet (50 mg total) by mouth every 6 (six) hours as needed for pain.  15 tablet  0   No current facility-administered medications for this visit.    ALLERGIES:  is allergic to doxycycline and latex.  REVIEW OF SYSTEMS:  The rest of the 14-point review of system was negative.   Filed Vitals:   02/16/13 0912  BP: 171/80  Pulse: 86  Temp: 96.9 F (36.1 C)  Resp: 18   Wt Readings from Last 3 Encounters:  02/16/13 200 lb 12.8 oz (91.082 kg)  08/14/12 205 lb 6.4 oz (93.169 kg)  02/10/12 207 lb (93.895 kg)   ECOG Performance status: 0  PHYSICAL EXAMINATION:   General:  Mildly obese woman in no acute distress.  Eyes:  no scleral icterus.  ENT:  There  were no oropharyngeal lesions.  Neck was without thyromegaly.  Lymphatics:  Negative cervical, supraclavicular or axillary adenopathy.  Respiratory: lungs were clear bilaterally without wheezing or crackles.  Cardiovascular:  Regular rate and rhythm, S1/S2, without murmur, rub or gallop.  There was no pedal edema.  GI:  abdomen was soft, flat, nontender, nondistended, without organomegaly.  Muscoloskeletal:  no spinal tenderness of palpation of vertebral spine.  Skin exam was without echymosis, petichae.  Neuro exam was nonfocal.  Patient was able to get on and off exam table without assistance.  Gait was normal.  Patient was alerted and oriented.  Attention was good.   Language was appropriate.  Mood was normal without depression.  Speech was not pressured.  Thought content was not tangential.      LABORATORY/RADIOLOGY DATA:  Lab Results  Component Value Date   WBC 13.6* 02/16/2013   HGB  15.4 02/16/2013   HCT 47.0* 02/16/2013   PLT 465* 02/16/2013   GLUCOSE 396* 02/16/2013   CHOL 211* 11/16/2010   TRIG 321* 11/16/2010   HDL 31* 11/16/2010   LDLCALC 116* 11/16/2010   ALKPHOS 118 02/16/2013   ALT 17 02/16/2013   AST 29 02/16/2013   NA 137 02/16/2013   K 3.8 02/16/2013   CL 103 02/16/2013   CREATININE 0.9 02/16/2013   BUN 6.6* 02/16/2013   CO2 25 02/16/2013   HGBA1C 12.8 11/16/2010     ASSESSMENT AND PLAN:  1. JAK-2 negative PV: Her Hct today was 47%.  I advised her to proceed with phlebotomy today and every 2 months for Hct >45%.  I advised her to continue Hydrea and ASA at current dose with goal to decrease risk of blood clot.   2. Diarrhea: assumed to be IBS.  I advised her to try glutin free diet and start to eat yogurt.   3. HTN: She is on Metoprolol; Lilsinopril/HCTZ.   4. DM: She is on insulin, and sitagliptan/metformin per PCP.  Her Glc was almost 400 today.  She also has polydipsia. I advised her to contact her Endocrinologist today for titration of her insulin as appropriate.  I will fax over the result of today CMET to Dr. Elyse Hsu.   5. HLP: She is on Pravastatin per PCP.   6. Age appropriate cancer screening: Her last mammogram in 01/2012 was negative per her report. Next one is due in 02/2012.   7. Follow up: q14monthCBC and phlebotomy for Hct >45. We'll see her in 6 months.   I informed her and her husband that I'm leaving the practice.  [facility] for [facility] to see a new provider when she returns.    Nuria Phebus T. HLamonte Sakai M.D.

## 2013-04-18 ENCOUNTER — Telehealth: Payer: Self-pay | Admitting: *Deleted

## 2013-04-18 ENCOUNTER — Other Ambulatory Visit (HOSPITAL_BASED_OUTPATIENT_CLINIC_OR_DEPARTMENT_OTHER): Payer: Medicare Other | Admitting: Lab

## 2013-04-18 ENCOUNTER — Ambulatory Visit: Payer: Medicare Other

## 2013-04-18 DIAGNOSIS — D751 Secondary polycythemia: Secondary | ICD-10-CM

## 2013-04-18 LAB — CBC WITH DIFFERENTIAL/PLATELET
Basophils Absolute: 0.1 10*3/uL (ref 0.0–0.1)
EOS%: 6.1 % (ref 0.0–7.0)
HGB: 14 g/dL (ref 11.6–15.9)
LYMPH%: 12.4 % — ABNORMAL LOW (ref 14.0–49.7)
MCH: 29.8 pg (ref 25.1–34.0)
MCV: 89.4 fL (ref 79.5–101.0)
MONO%: 7.1 % (ref 0.0–14.0)
NEUT%: 74 % (ref 38.4–76.8)
RDW: 13.8 % (ref 11.2–14.5)

## 2013-04-18 LAB — TECHNOLOGIST REVIEW

## 2013-04-18 NOTE — Progress Notes (Signed)
Hct 42 today.  Pt did not meet criteria for phlebotomy today.  Spoke with pt and husband in the lobby.  Explanations given to pt and husband.  Sent pt to scheduler for new appt calendar.

## 2013-04-18 NOTE — Telephone Encounter (Signed)
Message copied by Maudie Mercury on Wed Apr 18, 2013 11:44 AM ------      Message from: Amanda [name] R      Created: Wed Apr 18, 2013 10:29 AM       Please call pt. Hct is <45 and she does not need a phlebotomy. Continue Hydrea 500 mg BID. ------

## 2013-04-18 NOTE — Telephone Encounter (Signed)
Pt already notified of phlebotomy canceled today by infusion RN.

## 2013-05-24 ENCOUNTER — Emergency Department (HOSPITAL_BASED_OUTPATIENT_CLINIC_OR_DEPARTMENT_OTHER)
Admission: EM | Admit: 2013-05-24 | Discharge: 2013-05-24 | Disposition: A | Discharge status: Home / Self Care | Payer: Medicare Other | Attending: Emergency Medicine | Admitting: Emergency Medicine

## 2013-05-24 ENCOUNTER — Emergency Department (HOSPITAL_BASED_OUTPATIENT_CLINIC_OR_DEPARTMENT_OTHER): Payer: Medicare Other

## 2013-05-24 ENCOUNTER — Encounter: Payer: Self-pay | Admitting: Internal Medicine

## 2013-05-24 ENCOUNTER — Encounter (HOSPITAL_BASED_OUTPATIENT_CLINIC_OR_DEPARTMENT_OTHER): Payer: Self-pay | Admitting: *Deleted

## 2013-05-24 ENCOUNTER — Ambulatory Visit (INDEPENDENT_AMBULATORY_CARE_PROVIDER_SITE_OTHER): Payer: Self-pay | Admitting: Internal Medicine

## 2013-05-24 VITALS — BP 169/84 | HR 93 | Resp 16 | Ht 64.5 in | Wt 196.0 lb

## 2013-05-24 DIAGNOSIS — Z872 Personal history of diseases of the skin and subcutaneous tissue: Secondary | ICD-10-CM | POA: Insufficient documentation

## 2013-05-24 DIAGNOSIS — Z87891 Personal history of nicotine dependence: Secondary | ICD-10-CM | POA: Insufficient documentation

## 2013-05-24 DIAGNOSIS — F3289 Other specified depressive episodes: Secondary | ICD-10-CM | POA: Insufficient documentation

## 2013-05-24 DIAGNOSIS — R197 Diarrhea, unspecified: Secondary | ICD-10-CM | POA: Insufficient documentation

## 2013-05-24 DIAGNOSIS — E785 Hyperlipidemia, unspecified: Secondary | ICD-10-CM | POA: Insufficient documentation

## 2013-05-24 DIAGNOSIS — M109 Gout, unspecified: Secondary | ICD-10-CM | POA: Insufficient documentation

## 2013-05-24 DIAGNOSIS — Z91040 Latex allergy status: Secondary | ICD-10-CM | POA: Insufficient documentation

## 2013-05-24 DIAGNOSIS — R350 Frequency of micturition: Secondary | ICD-10-CM | POA: Insufficient documentation

## 2013-05-24 DIAGNOSIS — E119 Type 2 diabetes mellitus without complications: Secondary | ICD-10-CM

## 2013-05-24 DIAGNOSIS — R631 Polydipsia: Secondary | ICD-10-CM | POA: Insufficient documentation

## 2013-05-24 DIAGNOSIS — R739 Hyperglycemia, unspecified: Secondary | ICD-10-CM

## 2013-05-24 DIAGNOSIS — F329 Major depressive disorder, single episode, unspecified: Secondary | ICD-10-CM | POA: Insufficient documentation

## 2013-05-24 DIAGNOSIS — R5383 Other fatigue: Secondary | ICD-10-CM | POA: Insufficient documentation

## 2013-05-24 DIAGNOSIS — I10 Essential (primary) hypertension: Secondary | ICD-10-CM | POA: Insufficient documentation

## 2013-05-24 DIAGNOSIS — Z79899 Other long term (current) drug therapy: Secondary | ICD-10-CM | POA: Insufficient documentation

## 2013-05-24 DIAGNOSIS — J208 Acute bronchitis due to other specified organisms: Secondary | ICD-10-CM

## 2013-05-24 DIAGNOSIS — E669 Obesity, unspecified: Secondary | ICD-10-CM | POA: Insufficient documentation

## 2013-05-24 DIAGNOSIS — R7309 Other abnormal glucose: Secondary | ICD-10-CM

## 2013-05-24 DIAGNOSIS — Z8619 Personal history of other infectious and parasitic diseases: Secondary | ICD-10-CM | POA: Insufficient documentation

## 2013-05-24 DIAGNOSIS — J209 Acute bronchitis, unspecified: Secondary | ICD-10-CM | POA: Insufficient documentation

## 2013-05-24 DIAGNOSIS — Z794 Long term (current) use of insulin: Secondary | ICD-10-CM | POA: Insufficient documentation

## 2013-05-24 DIAGNOSIS — E1169 Type 2 diabetes mellitus with other specified complication: Secondary | ICD-10-CM | POA: Insufficient documentation

## 2013-05-24 DIAGNOSIS — R3589 Other polyuria: Secondary | ICD-10-CM | POA: Insufficient documentation

## 2013-05-24 DIAGNOSIS — Z8669 Personal history of other diseases of the nervous system and sense organs: Secondary | ICD-10-CM

## 2013-05-24 DIAGNOSIS — R5381 Other malaise: Secondary | ICD-10-CM | POA: Insufficient documentation

## 2013-05-24 DIAGNOSIS — E860 Dehydration: Secondary | ICD-10-CM | POA: Insufficient documentation

## 2013-05-24 DIAGNOSIS — Z862 Personal history of diseases of the blood and blood-forming organs and certain disorders involving the immune mechanism: Secondary | ICD-10-CM | POA: Insufficient documentation

## 2013-05-24 DIAGNOSIS — Z7982 Long term (current) use of aspirin: Secondary | ICD-10-CM | POA: Insufficient documentation

## 2013-05-24 DIAGNOSIS — R42 Dizziness and giddiness: Secondary | ICD-10-CM | POA: Insufficient documentation

## 2013-05-24 DIAGNOSIS — R358 Other polyuria: Secondary | ICD-10-CM | POA: Insufficient documentation

## 2013-05-24 LAB — URINE MICROSCOPIC-ADD ON

## 2013-05-24 LAB — GLUCOSE, CAPILLARY: Glucose-Capillary: >600 mg/dL (ref 70–99)

## 2013-05-24 LAB — CBC
HCT: 45.6 % (ref 36.0–46.0)
MCH: 30.3 pg (ref 26.0–34.0)
MCHC: 34 g/dL (ref 30.0–36.0)
MCV: 89.2 fL (ref 78.0–100.0)
RDW: 13.6 % (ref 11.5–15.5)

## 2013-05-24 LAB — URINALYSIS, ROUTINE W REFLEX MICROSCOPIC
Glucose, UA: >1000 mg/dL — AB
Leukocytes, UA: NEGATIVE
Nitrite: NEGATIVE
pH: 5.5 (ref 5.0–8.0)

## 2013-05-24 LAB — COMPREHENSIVE METABOLIC PANEL
Albumin: 3.8 g/dL (ref 3.5–5.2)
BUN: 12 mg/dL (ref 6–23)
Calcium: 9.8 mg/dL (ref 8.4–10.5)
Creatinine, Ser: 0.7 mg/dL (ref 0.50–1.10)
GFR calc Af Amer: >90 mL/min (ref >90)
Total Protein: 7.1 g/dL (ref 6.0–8.3)

## 2013-05-24 MED ORDER — SODIUM CHLORIDE 0.9 % IV BOLUS (SEPSIS)
1000.0000 mL | Freq: Once | INTRAVENOUS | Status: AC
  Administered 2013-05-24: 1000 mL via INTRAVENOUS

## 2013-05-24 MED ORDER — INSULIN REGULAR HUMAN 100 UNIT/ML IJ SOLN
INTRAMUSCULAR | Status: AC
  Filled 2013-05-24: qty 10

## 2013-05-24 MED ORDER — INSULIN REGULAR HUMAN 100 UNIT/ML IJ SOLN
10.0000 [IU] | Freq: Once | INTRAMUSCULAR | Status: AC
  Administered 2013-05-24: 10 [IU] via SUBCUTANEOUS

## 2013-05-24 MED ORDER — INSULIN REGULAR HUMAN 100 UNIT/ML IJ SOLN: 10.0000 [IU] | Freq: Once | INTRAMUSCULAR | Status: DC

## 2013-05-24 MED ORDER — INSULIN REGULAR HUMAN 100 UNIT/ML IJ SOLN
10.0000 [IU] | Freq: Once | INTRAMUSCULAR | Status: AC
  Administered 2013-05-24: 10 [IU] via INTRAVENOUS

## 2013-05-24 MED ORDER — INSULIN REGULAR HUMAN 100 UNIT/ML IJ SOLN
INTRAMUSCULAR | Status: AC
  Administered 2013-05-24: 10 [IU] via SUBCUTANEOUS
  Filled 2013-05-24: qty 1

## 2013-05-24 NOTE — ED Notes (Signed)
Pt sent down from PMD office for eval of BS 500, pt reports i take my insulin sometimes"

## 2013-05-24 NOTE — ED Notes (Signed)
MD at bedside. 

## 2013-05-24 NOTE — ED Notes (Signed)
Pt sts she would like to go home.

## 2013-05-24 NOTE — ED Provider Notes (Signed)
CSN: [id number]     Arrival date & time 05/24/13  1438 History     First MD Initiated Contact with Patient 05/24/13 [phone]     Chief Complaint  Patient presents with  . Hyperglycemia   (Consider location/radiation/quality/duration/timing/severity/associated sxs/prior Treatment) HPI Pt with history of DM and noncompliance with insulin p/w with elevated glu, polydipsia, polyuria, diarrhea and fatiguex 4-7 weeks. Pt has been taking indomethacin for L great toe gout but denies CP, cough, abd pain, dysuria. Pt states she has felt dehydrated and has been getting lightheaded when standing. No focal weakness, numbness.  Past Medical History  Diagnosis Date  . Hypertension   . Hyperlipidemia   . Diabetes mellitus 2008  . Depression   . Obesity   . Psoriasis   . Polycythemia     Dr. Elease Hashimoto- [facility]  . Herpes simplex   . Gout    Past Surgical History  Procedure Laterality Date  . Breast surgery      milk duct  . Total abdominal hysterectomy w/ bilateral salpingoophorectomy      for heavy periods  . Tonsillectomy     Family History  Problem Relation Age of Onset  . Diabetes Mother   . Heart disease Mother     CAD  . Hyperlipidemia Mother   . Hypertension Mother   . Kidney disease Mother   . Kidney disease Father   . Cancer Other     breast   History  Substance Use Topics  . Smoking status: Former Smoker    Quit date: 10/19/1991  . Smokeless tobacco: Not on file  . Alcohol Use: No   OB History   Grav Para Term Preterm Abortions TAB SAB Ect Mult Living                 Review of Systems  Constitutional: Positive for fatigue. Negative for fever and chills.  HENT: Negative for neck pain and neck stiffness.   Eyes: Negative for visual disturbance.  Respiratory: Negative for cough, chest tightness, shortness of breath and wheezing.   Cardiovascular: Negative for chest pain, palpitations and leg swelling.  Gastrointestinal: Positive for diarrhea. Negative for nausea,  vomiting, abdominal pain and constipation.  Endocrine: Positive for polydipsia and polyuria.  Genitourinary: Positive for frequency. Negative for dysuria.  Musculoskeletal: Negative for myalgias and back pain.  Skin: Negative for rash and wound.  Neurological: Positive for dizziness and light-headedness. Negative for tremors, syncope, weakness, numbness and headaches.  All other systems reviewed and are negative.    Allergies  Doxycycline and Latex  Home Medications   Current Outpatient Rx  Name  Route  Sig  Dispense  Refill  . aspirin 81 MG EC tablet   Oral   Take 81 mg by mouth daily.           . cholecalciferol (VITAMIN D) 1000 UNITS tablet   Oral   Take 1,000 Units by mouth daily.         Marland Kitchen erythromycin ophthalmic ointment   Left Eye   Place 1 application into the left eye every 4 (four) hours as needed. For pink eye         . HUMALOG MIX 75/25 KWIKPEN (75-25) 100 UNIT/ML SUPN               . hydroxyurea (HYDREA) 500 MG capsule   Oral   Take 500 mg by mouth 2 (two) times daily.          . indomethacin (INDOCIN)  25 MG capsule   Oral   Take 25 mg by mouth 2 (two) times daily with a meal.         . insulin aspart protamine-insulin aspart (NOVOLOG 70/30) (70-30) 100 UNIT/ML injection   Subcutaneous   Inject 40 Units into the skin 3 (three) times daily before meals. 50 units at breakfast, 40 units at lunch, and 70 at night         . Liraglutide (VICTOZA) 18 MG/3ML SOLN   Subcutaneous   Inject 1.8 mg into the skin at bedtime.         Marland Kitchen lisinopril-hydrochlorothiazide (PRINZIDE,ZESTORETIC) 20-12.5 MG per tablet   Oral   Take 1 tablet by mouth daily.          . metoprolol (LOPRESSOR) 100 MG tablet   Oral   Take 100 mg by mouth 2 (two) times daily.           Marland Kitchen nystatin-triamcinolone (MYCOLOG II) cream               . pravastatin (PRAVACHOL) 80 MG tablet   Oral   Take 40 mg by mouth daily.          . traMADol (ULTRAM) 50 MG tablet    Oral   Take 1 tablet (50 mg total) by mouth every 6 (six) hours as needed for pain.   15 tablet   0    BP 193/81  Pulse 95  Temp(Src) 98.2 F (36.8 C) (Oral)  SpO2 94% Physical Exam  Nursing note and vitals reviewed. Constitutional: She is oriented to person, place, and time. She appears well-developed and well-nourished. No distress.  HENT:  Head: Normocephalic and atraumatic.  Dry MM  Eyes: EOM are normal. Pupils are equal, round, and reactive to light.  Neck: Normal range of motion. Neck supple.  Cardiovascular: Normal rate and regular rhythm.   Pulmonary/Chest: Effort normal and breath sounds normal. No respiratory distress. She has no wheezes. She has no rales. She exhibits no tenderness.  Abdominal: Soft. Bowel sounds are normal. She exhibits no distension and no mass. There is no tenderness. There is no rebound and no guarding.  Musculoskeletal: Normal range of motion. She exhibits tenderness. She exhibits no edema.  TTP over L 1st great toe MTP. No erythema or warmth.   Neurological: She is alert and oriented to person, place, and time.  5/5 motor in all ext, sensation intact  Skin: Skin is warm and dry. No rash noted. No erythema.  Psychiatric: She has a normal mood and affect. Her behavior is normal.    ED Course   Procedures (including critical care time)  Labs Reviewed  CBC - Abnormal; Notable for the following:    WBC 18.7 (*)    Hemoglobin 15.5 (*)    Platelets 706 (*)    All other components within normal limits  COMPREHENSIVE METABOLIC PANEL - Abnormal; Notable for the following:    Sodium 129 (*)    Chloride 90 (*)    Glucose, Bld 670 (*)    AST 43 (*)    Alkaline Phosphatase 157 (*)    GFR calc non Af Amer 84 (*)    All other components within normal limits  URINALYSIS, ROUTINE W REFLEX MICROSCOPIC - Abnormal; Notable for the following:    Specific Gravity, Urine 1.031 (*)    Glucose, UA >1000 (*)    All other components within normal limits   GLUCOSE, CAPILLARY - Abnormal; Notable for the following:  Glucose-Capillary >600 (*)    All other components within normal limits  GLUCOSE, CAPILLARY - Abnormal; Notable for the following:    Glucose-Capillary 385 (*)    All other components within normal limits  GLUCOSE, CAPILLARY - Abnormal; Notable for the following:    Glucose-Capillary 270 (*)    All other components within normal limits  URINE MICROSCOPIC-ADD ON   Dg Chest 2 View  05/24/2013   *RADIOLOGY REPORT*  Clinical Data: Hyperglycemia, history of hypertension  CHEST - 2 VIEW  Comparison: None.  Findings: No active infiltrate or effusion is seen.  Mild basilar linear atelectasis is noted.  There is cardiomegaly present.  There is some peribronchial thickening which may indicate bronchitis.  No acute bony abnormality is seen.  IMPRESSION: No pneumonia.  Question bronchitis.  Mild cardiomegaly.   Original Report Authenticated By: Ivar Drape, M.D.   1. Hyperglycemia   2. Viral bronchitis     MDM  Glucose improved in ED. Pt advised to f/u with PMD for DM management.   Julianne Rice, MD 05/24/13 [phone]

## 2013-05-24 NOTE — ED Notes (Signed)
Patient transported to X-ray 

## 2013-05-26 DIAGNOSIS — Z8669 Personal history of other diseases of the nervous system and sense organs: Secondary | ICD-10-CM | POA: Insufficient documentation

## 2013-05-26 NOTE — Progress Notes (Signed)
Subjective:    Patient ID: Amanda [name], female    DOB: May 24, 1941, 72 y.o.   MRN: [id number]  HPI  Amanda [name] is here for first visit to establish care.   She is with Amanda [name].  PMH of IDDM, HTN, polycythemia vera,  Hyperlipidemia, depression,  and history of Bells Palsy.   She tells me she has been very nonadherent to Amanda insulin .  [name] states she has been very sleepy at home.  Positive for nausea,  Polydipsia and polyuria.   Pt states Amanda glucose at home this morning was over 600 and she took 50 units of Amanda 70/30 insulin.     Will transfer to ER for emergent management   Allergies  Allergen Reactions  . Doxycycline   . Latex    Past Medical History  Diagnosis Date  . Hypertension   . Hyperlipidemia   . Diabetes mellitus 2008  . Depression   . Obesity   . Psoriasis   . Polycythemia     Dr. Elease Hashimoto- [facility]  . Herpes simplex   . Gout    Past Surgical History  Procedure Laterality Date  . Breast surgery      milk duct  . Total abdominal hysterectomy w/ bilateral salpingoophorectomy      for heavy periods  . Tonsillectomy     History   Social History  . Marital Status: Married    Spouse Name: N/A    Number of Children: N/A  . Years of Education: N/A   Occupational History  . Not on file.   Social History Main Topics  . Smoking status: Former Smoker    Quit date: 10/19/1991  . Smokeless tobacco: Not on file  . Alcohol Use: No  . Drug Use: No  . Sexually Active: Yes    Birth Control/ Protection: None   Other Topics Concern  . Not on file   Social History Narrative  . No narrative on file   Family History  Problem Relation Age of Onset  . Diabetes Mother   . Heart disease Mother     CAD  . Hyperlipidemia Mother   . Hypertension Mother   . Kidney disease Mother   . Kidney disease Father   . Cancer Other     breast   Patient Active Problem List   Diagnosis Date Noted  . History of Bell's palsy 05/26/2013  . DERMATITIS, ATOPIC  11/16/2010  . DIZZINESS 11/16/2010  . WEIGHT GAIN 11/16/2010  . HERPETIC WHITLOW 06/30/2010  . HYPERGLYCEMIA 05/07/2010  . DIASTOLIC DYSFUNCTION 56/31/4970  . DYSPNEA 02/17/2010  . ABSCESS, SKIN 12/22/2009  . SKIN RASH 12/22/2009  . HYPERLIPIDEMIA 12/15/2009  . DIABETES MELLITUS, TYPE II 11/24/2009  . POLYCYTHEMIA 11/24/2009  . ESSENTIAL HYPERTENSION, BENIGN 11/24/2009  . PSORIASIS 11/24/2009  . Depressive disorder, not elsewhere classified 09/13/2009  . ABSCESS, LEG 09/13/2009  . SPRAIN/STRAIN, ANKLE NOS 01/21/2009   Current Outpatient Prescriptions on File Prior to Visit  Medication Sig Dispense Refill  . aspirin 81 MG EC tablet Take 81 mg by mouth daily.        . cholecalciferol (VITAMIN D) 1000 UNITS tablet Take 1,000 Units by mouth daily.      Marland Kitchen erythromycin ophthalmic ointment Place 1 application into the left eye every 4 (four) hours as needed. For pink eye      . hydroxyurea (HYDREA) 500 MG capsule Take 500 mg by mouth 2 (two) times daily.       . insulin aspart  protamine-insulin aspart (NOVOLOG 70/30) (70-30) 100 UNIT/ML injection Inject 40 Units into the skin 3 (three) times daily before meals. 50 units at breakfast, 40 units at lunch, and 70 at night      . Liraglutide (VICTOZA) 18 MG/3ML SOLN Inject 1.8 mg into the skin at bedtime.      Marland Kitchen lisinopril-hydrochlorothiazide (PRINZIDE,ZESTORETIC) 20-12.5 MG per tablet Take 1 tablet by mouth daily.       . metoprolol (LOPRESSOR) 100 MG tablet Take 100 mg by mouth 2 (two) times daily.        . pravastatin (PRAVACHOL) 80 MG tablet Take 40 mg by mouth daily.       . traMADol (ULTRAM) 50 MG tablet Take 1 tablet (50 mg total) by mouth every 6 (six) hours as needed for pain.  15 tablet  0   No current facility-administered medications on file prior to visit.      Review of Systems    See HPI Objective:   Physical Exam Physical Exam  Nursing note and vitals reviewed.  Constitutional: She is oriented to person, place, and  time. She appears well-developed and well-nourished.  HENT:  Head: Normocephalic and atraumatic.  Cardiovascular: Normal rate and regular rhythm. Exam reveals no gallop and no friction rub.  No murmur heard.  Pulmonary/Chest: Breath sounds normal. She has no wheezes. She has no rales.  Neurological: She is alert and oriented to person, place, and time.  Skin: Skin is warm and dry.  Psychiatric: She has a normal mood and affect. Amanda behavior is normal.             Assessment & Plan:  DM  Out of control/nonadherence    Will transfer to ER for further management

## 2013-05-28 ENCOUNTER — Telehealth: Payer: Self-pay | Admitting: *Deleted

## 2013-05-28 ENCOUNTER — Other Ambulatory Visit: Payer: Self-pay | Admitting: *Deleted

## 2013-05-28 NOTE — Telephone Encounter (Signed)
Dr Coralyn Mark spoke with Pt endrocrinologist and they will be contacting pt

## 2013-05-28 NOTE — Telephone Encounter (Signed)
Amanda [name]  was sent to ER during her appt due to high BS. She called to let us know that it went high again 590s. She want to know what she needs to do.

## 2013-05-29 ENCOUNTER — Telehealth: Payer: Self-pay | Admitting: *Deleted

## 2013-05-29 NOTE — Telephone Encounter (Signed)
Educated pt on correct dosage of insulin pt states that she does not have her invokana or victoza and is unable to tolerate metformin instructed pt to take 4 units of insulin now for a BS of 328 per Dr. Coralyn Mark VO. Pt voiced understanding.

## 2013-05-31 ENCOUNTER — Ambulatory Visit (INDEPENDENT_AMBULATORY_CARE_PROVIDER_SITE_OTHER): Payer: Self-pay | Admitting: Internal Medicine

## 2013-05-31 ENCOUNTER — Inpatient Hospital Stay (HOSPITAL_COMMUNITY)
Admission: AD | Admit: 2013-05-31 | Discharge: 2013-06-02 | DRG: 638 | Disposition: A | Discharge status: Home / Self Care | Payer: Medicare Other | Source: Ambulatory Visit | Attending: Internal Medicine | Admitting: Internal Medicine

## 2013-05-31 ENCOUNTER — Encounter (HOSPITAL_COMMUNITY): Payer: Self-pay | Admitting: *Deleted

## 2013-05-31 ENCOUNTER — Encounter: Payer: Self-pay | Admitting: Internal Medicine

## 2013-05-31 ENCOUNTER — Inpatient Hospital Stay (HOSPITAL_COMMUNITY): Payer: Medicare Other

## 2013-05-31 VITALS — BP 165/71 | HR 95 | Temp 96.9°F | Resp 18 | Wt 197.0 lb

## 2013-05-31 DIAGNOSIS — IMO0001 Reserved for inherently not codable concepts without codable children: Principal | ICD-10-CM | POA: Diagnosis present

## 2013-05-31 DIAGNOSIS — Z91199 Patient's noncompliance with other medical treatment and regimen due to unspecified reason: Secondary | ICD-10-CM

## 2013-05-31 DIAGNOSIS — R7309 Other abnormal glucose: Secondary | ICD-10-CM

## 2013-05-31 DIAGNOSIS — Z9119 Patient's noncompliance with other medical treatment and regimen: Secondary | ICD-10-CM

## 2013-05-31 DIAGNOSIS — F3289 Other specified depressive episodes: Secondary | ICD-10-CM | POA: Diagnosis present

## 2013-05-31 DIAGNOSIS — Z79899 Other long term (current) drug therapy: Secondary | ICD-10-CM

## 2013-05-31 DIAGNOSIS — E119 Type 2 diabetes mellitus without complications: Secondary | ICD-10-CM

## 2013-05-31 DIAGNOSIS — R358 Other polyuria: Secondary | ICD-10-CM | POA: Diagnosis present

## 2013-05-31 DIAGNOSIS — R3589 Other polyuria: Secondary | ICD-10-CM | POA: Diagnosis present

## 2013-05-31 DIAGNOSIS — Z6834 Body mass index (BMI) 34.0-34.9, adult: Secondary | ICD-10-CM

## 2013-05-31 DIAGNOSIS — R739 Hyperglycemia, unspecified: Secondary | ICD-10-CM

## 2013-05-31 DIAGNOSIS — E669 Obesity, unspecified: Secondary | ICD-10-CM | POA: Diagnosis present

## 2013-05-31 DIAGNOSIS — N390 Urinary tract infection, site not specified: Secondary | ICD-10-CM | POA: Diagnosis present

## 2013-05-31 DIAGNOSIS — Z7982 Long term (current) use of aspirin: Secondary | ICD-10-CM

## 2013-05-31 DIAGNOSIS — D751 Secondary polycythemia: Secondary | ICD-10-CM | POA: Diagnosis present

## 2013-05-31 DIAGNOSIS — F329 Major depressive disorder, single episode, unspecified: Secondary | ICD-10-CM | POA: Diagnosis present

## 2013-05-31 DIAGNOSIS — D45 Polycythemia vera: Secondary | ICD-10-CM | POA: Diagnosis present

## 2013-05-31 DIAGNOSIS — Z8669 Personal history of other diseases of the nervous system and sense organs: Secondary | ICD-10-CM

## 2013-05-31 DIAGNOSIS — R82998 Other abnormal findings in urine: Secondary | ICD-10-CM | POA: Diagnosis present

## 2013-05-31 DIAGNOSIS — Z794 Long term (current) use of insulin: Secondary | ICD-10-CM

## 2013-05-31 DIAGNOSIS — Z87891 Personal history of nicotine dependence: Secondary | ICD-10-CM

## 2013-05-31 DIAGNOSIS — E785 Hyperlipidemia, unspecified: Secondary | ICD-10-CM | POA: Diagnosis present

## 2013-05-31 DIAGNOSIS — R8271 Bacteriuria: Secondary | ICD-10-CM | POA: Diagnosis present

## 2013-05-31 DIAGNOSIS — L408 Other psoriasis: Secondary | ICD-10-CM | POA: Diagnosis present

## 2013-05-31 DIAGNOSIS — E1165 Type 2 diabetes mellitus with hyperglycemia: Secondary | ICD-10-CM

## 2013-05-31 DIAGNOSIS — M109 Gout, unspecified: Secondary | ICD-10-CM | POA: Diagnosis present

## 2013-05-31 DIAGNOSIS — E860 Dehydration: Secondary | ICD-10-CM | POA: Diagnosis present

## 2013-05-31 DIAGNOSIS — I10 Essential (primary) hypertension: Secondary | ICD-10-CM | POA: Diagnosis present

## 2013-05-31 DIAGNOSIS — D72829 Elevated white blood cell count, unspecified: Secondary | ICD-10-CM | POA: Diagnosis present

## 2013-05-31 HISTORY — DX: Hyperglycemia, unspecified: R73.9

## 2013-05-31 LAB — COMPREHENSIVE METABOLIC PANEL
ALT: 22 U/L (ref 0–35)
AST: 42 U/L — ABNORMAL HIGH (ref 0–37)
CO2: 27 mEq/L (ref 19–32)
Chloride: 99 mEq/L (ref 96–112)
Creatinine, Ser: 0.66 mg/dL (ref 0.50–1.10)
GFR calc non Af Amer: 86 mL/min — ABNORMAL LOW (ref >90)
Sodium: 134 mEq/L — ABNORMAL LOW (ref 135–145)
Total Bilirubin: 0.5 mg/dL (ref 0.3–1.2)

## 2013-05-31 LAB — CBC WITH DIFFERENTIAL/PLATELET
Eosinophils Absolute: 0.7 10*3/uL (ref 0.0–0.7)
MCH: 30.4 pg (ref 26.0–34.0)
MCHC: 34.3 g/dL (ref 30.0–36.0)
Monocytes Absolute: 1.1 10*3/uL — ABNORMAL HIGH (ref 0.1–1.0)
Neutrophils Relative %: 73 % (ref 43–77)
Platelets: 691 10*3/uL — ABNORMAL HIGH (ref 150–400)
RDW: 14 % (ref 11.5–15.5)

## 2013-05-31 LAB — GLUCOSE, CAPILLARY
Glucose-Capillary: 412 mg/dL — ABNORMAL HIGH (ref 70–99)
Glucose-Capillary: 235 mg/dL — ABNORMAL HIGH (ref 70–99)

## 2013-05-31 LAB — MRSA PCR SCREENING: MRSA by PCR: NEGATIVE

## 2013-05-31 LAB — URINALYSIS, ROUTINE W REFLEX MICROSCOPIC
Ketones, ur: NEGATIVE mg/dL
Nitrite: NEGATIVE
Protein, ur: NEGATIVE mg/dL

## 2013-05-31 LAB — MAGNESIUM: Magnesium: 1.7 mg/dL (ref 1.5–2.5)

## 2013-05-31 LAB — URINE MICROSCOPIC-ADD ON

## 2013-05-31 LAB — HEMOGLOBIN A1C: Hgb A1c MFr Bld: 12.2 % — ABNORMAL HIGH (ref <5.7)

## 2013-05-31 LAB — APTT: aPTT: 35 seconds (ref 24–37)

## 2013-05-31 MED ORDER — TRAMADOL HCL 50 MG PO TABS: 50.0000 mg | ORAL_TABLET | Freq: Four times a day (QID) | ORAL | Status: DC | PRN

## 2013-05-31 MED ORDER — METOPROLOL TARTRATE 100 MG PO TABS
100.0000 mg | ORAL_TABLET | Freq: Every day | ORAL | Status: DC
  Administered 2013-05-31 – 2013-06-01 (×2): 100 mg via ORAL
  Filled 2013-05-31 (×4): qty 1

## 2013-05-31 MED ORDER — OXYCODONE HCL 5 MG PO TABS: 5.0000 mg | ORAL_TABLET | ORAL | Status: DC | PRN

## 2013-05-31 MED ORDER — LISINOPRIL 20 MG PO TABS
20.0000 mg | ORAL_TABLET | Freq: Every day | ORAL | Status: DC
  Administered 2013-05-31 – 2013-06-01 (×2): 20 mg via ORAL
  Filled 2013-05-31 (×4): qty 1

## 2013-05-31 MED ORDER — SODIUM CHLORIDE 0.9 % IV SOLN
INTRAVENOUS | Status: DC
  Administered 2013-06-01: via INTRAVENOUS
  Administered 2013-06-01: 125 mL/h via INTRAVENOUS

## 2013-05-31 MED ORDER — ONDANSETRON HCL 4 MG/2ML IJ SOLN: 4.0000 mg | Freq: Four times a day (QID) | INTRAMUSCULAR | Status: DC | PRN

## 2013-05-31 MED ORDER — ACETAMINOPHEN 650 MG RE SUPP: 650.0000 mg | Freq: Four times a day (QID) | RECTAL | Status: DC | PRN

## 2013-05-31 MED ORDER — ACETAMINOPHEN 325 MG PO TABS
650.0000 mg | ORAL_TABLET | Freq: Four times a day (QID) | ORAL | Status: DC | PRN
  Administered 2013-06-01: 650 mg via ORAL
  Filled 2013-05-31: qty 2

## 2013-05-31 MED ORDER — INSULIN ASPART 100 UNIT/ML ~~LOC~~ SOLN: 4.0000 [IU] | Freq: Three times a day (TID) | SUBCUTANEOUS | Status: DC

## 2013-05-31 MED ORDER — INSULIN ASPART PROT & ASPART (70-30 MIX) 100 UNIT/ML ~~LOC~~ SUSP
50.0000 [IU] | Freq: Two times a day (BID) | SUBCUTANEOUS | Status: DC
  Administered 2013-05-31: 50 [IU] via SUBCUTANEOUS
  Filled 2013-05-31: qty 10

## 2013-05-31 MED ORDER — NYSTATIN-TRIAMCINOLONE 100000-0.1 UNIT/GM-% EX CREA: TOPICAL_CREAM | Freq: Two times a day (BID) | CUTANEOUS | Status: DC

## 2013-05-31 MED ORDER — ENOXAPARIN SODIUM 40 MG/0.4ML ~~LOC~~ SOLN
40.0000 mg | SUBCUTANEOUS | Status: DC
  Administered 2013-06-01 – 2013-06-02 (×2): 40 mg via SUBCUTANEOUS
  Filled 2013-05-31 (×2): qty 0.4

## 2013-05-31 MED ORDER — HYDROXYUREA 500 MG PO CAPS
500.0000 mg | ORAL_CAPSULE | Freq: Two times a day (BID) | ORAL | Status: DC
  Administered 2013-05-31 – 2013-06-02 (×4): 500 mg via ORAL
  Filled 2013-05-31 (×5): qty 1

## 2013-05-31 MED ORDER — ASPIRIN 81 MG PO TBEC: 81.0000 mg | DELAYED_RELEASE_TABLET | Freq: Every day | ORAL | Status: DC

## 2013-05-31 MED ORDER — ONDANSETRON HCL 4 MG PO TABS: 4.0000 mg | ORAL_TABLET | Freq: Four times a day (QID) | ORAL | Status: DC | PRN

## 2013-05-31 MED ORDER — ASPIRIN EC 81 MG PO TBEC
81.0000 mg | DELAYED_RELEASE_TABLET | Freq: Every day | ORAL | Status: DC
  Administered 2013-06-01 – 2013-06-02 (×2): 81 mg via ORAL
  Filled 2013-05-31 (×2): qty 1

## 2013-05-31 MED ORDER — INSULIN ASPART 100 UNIT/ML ~~LOC~~ SOLN
0.0000 [IU] | Freq: Three times a day (TID) | SUBCUTANEOUS | Status: DC
  Administered 2013-06-01: 2 [IU] via SUBCUTANEOUS
  Administered 2013-06-01 – 2013-06-02 (×3): 3 [IU] via SUBCUTANEOUS

## 2013-05-31 MED ORDER — SODIUM CHLORIDE 0.9 % IV BOLUS (SEPSIS)
1000.0000 mL | Freq: Once | INTRAVENOUS | Status: AC
  Administered 2013-05-31: 1000 mL via INTRAVENOUS

## 2013-05-31 MED ORDER — SIMVASTATIN 40 MG PO TABS
40.0000 mg | ORAL_TABLET | Freq: Every day | ORAL | Status: DC
  Administered 2013-06-01: 40 mg via ORAL
  Filled 2013-05-31 (×2): qty 1

## 2013-05-31 NOTE — Progress Notes (Signed)
Subjective:    Patient ID: Amanda [name], female    DOB: Oct 14, 1941, 72 y.o.   MRN: [id number]  HPI Amanda [name] is here with her spouse  Since her last visit I have spoken with Her endocrinologist office.  The nursing staff  tells me that she has not been taking her medication as prescribed.  Her husband verifies this.    She has only taken one dose of her insulin today.    Her regimen should be  Humalog 75/25  50 units at breakfast  ,  40 units at lunch, and 70 units at supper. She cannot afford the [place] that Dr. Elyse Hsu has recommended  .  Metformin causes diarrhea.     Glucose in office today is 557.  She is sleepy.   No fever   No cough no dysuria    Allergies  Allergen Reactions  . Doxycycline   . Latex    Past Medical History  Diagnosis Date  . Hypertension   . Hyperlipidemia   . Diabetes mellitus 2008  . Depression   . Obesity   . Psoriasis   . Polycythemia     Dr. Elease Hashimoto- [facility]  . Herpes simplex   . Gout    Past Surgical History  Procedure Laterality Date  . Breast surgery      milk duct  . Total abdominal hysterectomy w/ bilateral salpingoophorectomy      for heavy periods  . Tonsillectomy     History   Social History  . Marital Status: Married    Spouse Name: N/A    Number of Children: N/A  . Years of Education: N/A   Occupational History  . Not on file.   Social History Main Topics  . Smoking status: Former Smoker    Quit date: 10/19/1991  . Smokeless tobacco: Not on file  . Alcohol Use: No  . Drug Use: No  . Sexual Activity: Yes    Birth Control/ Protection: None   Other Topics Concern  . Not on file   Social History Narrative  . No narrative on file   Family History  Problem Relation Age of Onset  . Diabetes Mother   . Heart disease Mother     CAD  . Hyperlipidemia Mother   . Hypertension Mother   . Kidney disease Mother   . Kidney disease Father   . Cancer Other     breast   Patient Active Problem  List   Diagnosis Date Noted  . History of Bell's palsy 05/26/2013  . DERMATITIS, ATOPIC 11/16/2010  . DIZZINESS 11/16/2010  . WEIGHT GAIN 11/16/2010  . HERPETIC WHITLOW 06/30/2010  . HYPERGLYCEMIA 05/07/2010  . DIASTOLIC DYSFUNCTION 40/98/1191  . DYSPNEA 02/17/2010  . ABSCESS, SKIN 12/22/2009  . SKIN RASH 12/22/2009  . HYPERLIPIDEMIA 12/15/2009  . DIABETES MELLITUS, TYPE II 11/24/2009  . POLYCYTHEMIA 11/24/2009  . ESSENTIAL HYPERTENSION, BENIGN 11/24/2009  . PSORIASIS 11/24/2009  . Depressive disorder, not elsewhere classified 09/13/2009  . ABSCESS, LEG 09/13/2009  . SPRAIN/STRAIN, ANKLE NOS 01/21/2009   Current Outpatient Prescriptions on File Prior to Visit  Medication Sig Dispense Refill  . aspirin 81 MG EC tablet Take 81 mg by mouth daily.        . cholecalciferol (VITAMIN D) 1000 UNITS tablet Take 1,000 Units by mouth daily.      Marland Kitchen HUMALOG MIX 75/25 KWIKPEN (75-25) 100 UNIT/ML SUPN Pt takes 40 units at breakfast 50 at lunch and 70 at bedtime      .  hydroxyurea (HYDREA) 500 MG capsule Take 500 mg by mouth 2 (two) times daily.       . indomethacin (INDOCIN) 25 MG capsule Take 25 mg by mouth 2 (two) times daily with a meal.      . lisinopril-hydrochlorothiazide (PRINZIDE,ZESTORETIC) 20-12.5 MG per tablet Take 1 tablet by mouth daily.       . metoprolol (LOPRESSOR) 100 MG tablet Take 100 mg by mouth 2 (two) times daily.        Marland Kitchen nystatin-triamcinolone (MYCOLOG II) cream       . pravastatin (PRAVACHOL) 80 MG tablet Take 40 mg by mouth daily.       . traMADol (ULTRAM) 50 MG tablet Take 1 tablet (50 mg total) by mouth every 6 (six) hours as needed for pain.  15 tablet  0  . erythromycin ophthalmic ointment Place 1 application into the left eye every 4 (four) hours as needed. For pink eye       No current facility-administered medications on file prior to visit.        Review of Systems See HPI    Objective:   Physical Exam  Physical Exam  Nursing note and vitals  reviewed.   Alert Constitutional: She is oriented to person, place, and time. She appears well-developed and well-nourished.  HENT:  Head: Normocephalic and atraumatic.  Cardiovascular: Normal rate and regular rhythm. Exam reveals no gallop and no friction rub.  No murmur heard.  Pulmonary/Chest: Breath sounds normal. She has no wheezes. She has no rales.  Neurological: She is alert and oriented to person, place, and time.  Skin: Skin is warm and dry.  Psychiatric: She has a normal mood and affect. Her behavior is normal.        Assessment & Plan:  Hyperglycemia/Diabetes out of control.    This is the second time I have seen pt in my office and both times glucose has been over 500.    I spoke with Dr. Darrick Meigs at [facility] and will admit for appropriate management and control

## 2013-05-31 NOTE — H&P (Signed)
[facility] History and Physical  Amanda [name] DPO:[id number] DOB: Jun 03, 1941 DOA: 05/31/2013  Referring physician: Dr Coralyn Mark PCP: Kelton Pillar, MD  Specialists:   Chief Complaint: High blood sugars  HPI: Amanda [name] is a 72 y.o. female   with history of hypertension, hyperlipidemia, insulin-dependent diabetes mellitus, depression, polycythemia, obesity, gout who presents as a direct admit from PCPs office secondary to elevated blood glucose  Levels of 500s. Patient states over the past week her sugars are ranging anywhere from 300 to the 600s. Patient was seen in the ED a few days ago consisted that her blood sugar was brought down from the 600 to the 200s and she was subsequently discharged home. Patient stated that she been seen her PCP for elevated sugars and was present at that today her blood sugars were in the 500s and was instructed to present to the hospital. Patient denies any chest pain, no shortness of breath, no fever, no chills, no nausea, no vomiting, no abdominal pain, no dysuria, no weakness, no cough. Patient does endorse some polyuria and polydipsia. Patient stated that was posterior tibial signals are however unable to afford that and as such has not been on that. Patient states she's been changed from NovoLog to Humalog she is on 7025 and take that 3 times a day. Patient states has been taking her insulin as prescribed. Patient also stated that she was started on something with an InV however unable afford this as well. Patient states metformin causes diarrhea.  Review of Systems: The patient denies anorexia, fever, weight loss,, vision loss, decreased hearing, hoarseness, chest pain, syncope, dyspnea on exertion, peripheral edema, balance deficits, hemoptysis, abdominal pain, melena, hematochezia, severe indigestion/heartburn, hematuria, incontinence, genital sores, muscle weakness, suspicious skin lesions, transient blindness, difficulty walking, depression,  unusual weight change, abnormal bleeding, enlarged lymph nodes, angioedema, and breast masses.   Past Medical History  Diagnosis Date  . Hypertension   . Hyperlipidemia   . Diabetes mellitus 2008  . Depression   . Obesity   . Psoriasis   . Polycythemia     Dr. Elease Hashimoto- [facility]  . Herpes simplex   . Gout   . Hyperglycemia 05/31/2013  . [name](238.4) 05/31/2013   Past Surgical History  Procedure Laterality Date  . Breast surgery      milk duct  . Total abdominal hysterectomy w/ bilateral salpingoophorectomy      for heavy periods  . Tonsillectomy     Social History:  reports that she quit smoking about 21 years ago. She has never used smokeless tobacco. She reports that she does not drink alcohol or use illicit drugs.   Allergies  Allergen Reactions  . Doxycycline   . Latex     Family History  Problem Relation Age of Onset  . Diabetes Mother   . Heart disease Mother     CAD  . Hyperlipidemia Mother   . Hypertension Mother   . Kidney disease Mother   . Kidney disease Father   . Cancer Other     breast    Prior to Admission medications   Medication Sig Start Date End Date Taking? Authorizing Provider  aspirin 81 MG EC tablet Take 81 mg by mouth daily.      Historical Provider, MD  cholecalciferol (VITAMIN D) 1000 UNITS tablet Take 1,000 Units by mouth daily.    Historical Provider, MD  erythromycin ophthalmic ointment Place 1 application into the left eye every 4 (four) hours as needed. For pink eye  Historical Provider, MD  HUMALOG MIX 75/25 KWIKPEN (75-25) 100 UNIT/ML SUPN Pt takes 40 units at breakfast 50 at lunch and 70 at bedtime 03/28/13   Historical Provider, MD  hydroxyurea (HYDREA) 500 MG capsule Take 500 mg by mouth 2 (two) times daily.     Historical Provider, MD  indomethacin (INDOCIN) 25 MG capsule Take 25 mg by mouth 2 (two) times daily with a meal.    Historical Provider, MD  lisinopril-hydrochlorothiazide (PRINZIDE,ZESTORETIC) 20-12.5  MG per tablet Take 1 tablet by mouth daily.     Historical Provider, MD  metoprolol (LOPRESSOR) 100 MG tablet Take 100 mg by mouth 2 (two) times daily.      Historical Provider, MD  nystatin-triamcinolone (MYCOLOG II) cream  04/19/13   Historical Provider, MD  pravastatin (PRAVACHOL) 80 MG tablet Take 40 mg by mouth daily.     Historical Provider, MD  traMADol (ULTRAM) 50 MG tablet Take 1 tablet (50 mg total) by mouth every 6 (six) hours as needed for pain. 10/25/12   Arville Lime Schinlever, PA-C   Physical Exam: Filed Vitals:   05/31/13 1644  BP: 184/80  Pulse: 92  Temp: 98.2 F (36.8 C)  Resp: 18     General:  Well-developed well-nourished in no acute cardiopulmonary distress.  Eyes: Pupils equal round and reactive to light and accommodation. Extraocular movements intact.  ENT: Oropharynx is clear, no lesions, no exudates. Dry mucous membranes.  Neck: Supple with no lymphadenopathy. No JVD.  Cardiovascular: Regular rate rhythm no murmurs rubs or gallops. No lower extremity edema.  Respiratory: Clear to auscultation bilaterally. Some decreased breath sounds on the right base. No wheezing. No rhonchi.  Abdomen: Soft, nontender, nondistended, positive bowel sounds.  Skin: No rashes or lesions.  Musculoskeletal: 4/5 bilateral upper extremity strength. 4/5 bilateral lower extremity strength.  Psychiatric: Normal mood. Normal affect. Fair insight. Fair judgment.  Neurologic: Alert and oriented x3. Cranial nerves II through XII are grossly intact. No focal deficits.  Labs on Admission:  Basic Metabolic Panel: No results found for this basename: NA, K, CL, CO2, GLUCOSE, BUN, CREATININE, CALCIUM, MG, PHOS,  in the last 168 hours Liver Function Tests: No results found for this basename: AST, ALT, ALKPHOS, BILITOT, PROT, ALBUMIN,  in the last 168 hours No results found for this basename: LIPASE, AMYLASE,  in the last 168 hours No results found for this basename: AMMONIA,  in the last  168 hours CBC: No results found for this basename: WBC, NEUTROABS, HGB, HCT, MCV, PLT,  in the last 168 hours Cardiac Enzymes: No results found for this basename: CKTOTAL, CKMB, CKMBINDEX, TROPONINI,  in the last 168 hours  BNP (last 3 results) No results found for this basename: PROBNP,  in the last 8760 hours CBG:  Recent Labs Lab 05/31/13 1642  GLUCAP 412*    Radiological Exams on Admission: No results found.  EKG: None  Assessment/Plan Principal Problem:   Hyperglycemia Active Problems:   DIABETES MELLITUS, TYPE II   HYPERLIPIDEMIA   POLYCYTHEMIA   Depressive disorder, not elsewhere classified   Essential hypertension, benign   History of Bell's palsy   Gout   [name](238.4)  #1 hyperglycemia Questionable etiology. May be secondary to medical noncompliance as patient unable 12 4 at the toes are secondary to financial constraints. Will check a UA with cultures and sensitivities to rule out UTI. Check a chest x-ray to rule out pneumonia. units twice daily.  Check a hemoglobin A1c.Check a CBC with differential. Check a comprehensive  metabolic profile. Will place on NovoLog 70/30 50units BID.Sliding scale insulin.  #2 insulin-dependent diabetes mellitus Patient is at high CBGs regularly 300s to the 600s over the past week with associated polydipsia, polyuria. Likely poorly controlled diabetes secondary to medication compliance as well as dietary indiscretions. Will check a hemoglobin A1c. Place on 70/30   50 units twice daily. Sliding scale insulin. Will consult with the diabetic coordinator. Follow.  #3 hypertension Continue lisinopril and metoprolol. Hold HCTZ for now.  #4 dehydration IV fluids.  #5 polycythemia vera Continue hydroxyurea.  #6 depressive disorder Stable.  #7 hyperlipidemia Check a fasting lipid panel. Continue statin.  #8 prophylaxis Lovenox for DVT prophylaxis.   Code Status: Full Family Communication: updated patient, husband  and daughter at bedside. Disposition Plan: Admit to medsurg  Time spent: 65 mins  [facility] Pager [phone]  If 7PM-7AM, please contact night-coverage [email or web address] Password [facility] 05/31/2013, 6:20 PM

## 2013-05-31 NOTE — Patient Instructions (Addendum)
TO Go to admitting department at [facility]    Dr. Darrick Meigs

## 2013-06-01 DIAGNOSIS — R8271 Bacteriuria: Secondary | ICD-10-CM | POA: Diagnosis present

## 2013-06-01 DIAGNOSIS — E119 Type 2 diabetes mellitus without complications: Secondary | ICD-10-CM

## 2013-06-01 DIAGNOSIS — I10 Essential (primary) hypertension: Secondary | ICD-10-CM

## 2013-06-01 DIAGNOSIS — N390 Urinary tract infection, site not specified: Secondary | ICD-10-CM

## 2013-06-01 LAB — BASIC METABOLIC PANEL
BUN: 10 mg/dL (ref 6–23)
Calcium: 8.9 mg/dL (ref 8.4–10.5)
Creatinine, Ser: 0.58 mg/dL (ref 0.50–1.10)
GFR calc Af Amer: >90 mL/min (ref >90)
GFR calc non Af Amer: 89 mL/min — ABNORMAL LOW (ref >90)

## 2013-06-01 LAB — CBC
MCHC: 33.8 g/dL (ref 30.0–36.0)
Platelets: 637 10*3/uL — ABNORMAL HIGH (ref 150–400)
RDW: 14.1 % (ref 11.5–15.5)
WBC: 15.9 10*3/uL — ABNORMAL HIGH (ref 4.0–10.5)

## 2013-06-01 LAB — LIPID PANEL
Cholesterol: 167 mg/dL (ref 0–200)
Triglycerides: 275 mg/dL — ABNORMAL HIGH (ref <150)
VLDL: 55 mg/dL — ABNORMAL HIGH (ref 0–40)

## 2013-06-01 LAB — URINE CULTURE

## 2013-06-01 LAB — GLUCOSE, CAPILLARY: Glucose-Capillary: 131 mg/dL — ABNORMAL HIGH (ref 70–99)

## 2013-06-01 MED ORDER — LIVING WELL WITH DIABETES BOOK
Freq: Once | Status: AC
  Administered 2013-06-01: 15:00:00
  Filled 2013-06-01: qty 1

## 2013-06-01 MED ORDER — INSULIN NPH ISOPHANE & REGULAR (70-30) 100 UNIT/ML ~~LOC~~ SUSP: 55.0000 [IU] | Freq: Two times a day (BID) | SUBCUTANEOUS | Status: DC

## 2013-06-01 MED ORDER — INSULIN ASPART PROT & ASPART (70-30 MIX) 100 UNIT/ML ~~LOC~~ SUSP
55.0000 [IU] | Freq: Two times a day (BID) | SUBCUTANEOUS | Status: DC
  Administered 2013-06-01 – 2013-06-02 (×3): 55 [IU] via SUBCUTANEOUS
  Filled 2013-06-01: qty 10

## 2013-06-01 MED ORDER — DEXTROSE 5 % IV SOLN
1.0000 g | INTRAVENOUS | Status: DC
  Administered 2013-06-01 – 2013-06-02 (×2): 1 g via INTRAVENOUS
  Filled 2013-06-01 (×2): qty 10

## 2013-06-01 NOTE — Progress Notes (Signed)
Inpatient Diabetes Program Recommendations  AACE/ADA: New Consensus Statement on Inpatient Glycemic Control (2013)  Target Ranges:  Prepandial:   less than 140 mg/dL      Peak postprandial:   less than 180 mg/dL (1-2 hours)      Critically ill patients:  140 - 180 mg/dL   Reason for Visit: Uncontrolled DM  72 y.o. female with history of hypertension, hyperlipidemia, insulin-dependent diabetes mellitus, depression, polycythemia, obesity admitted with hyperglycemia.  Pt states she has been non-compliant with diet and skips lunchtime dose of Humalog 70/30 insulin.  Pt states she now will have to pay for her meds out of pocket since hitting the "donut hole." Has PCP in [facility] whom she will be following up with for diabetes management.  Interested in diabetes classes and will watch diabetes videos this evening.  States she has had much stress lately with family sickness, financial issues and loss of friends.   Results for LESLI, ISSA (MRN [id number]) as of 06/01/2013 14:58  Ref. Range 05/31/2013 16:42 05/31/2013 22:09 06/01/2013 07:29 06/01/2013 11:20  Glucose-Capillary Latest Range: 70-99 mg/dL 412 (H) 235 (H) 131 (H) 166 (H)  Results for JOVIE, SWANNER (MRN [id number]) as of 06/01/2013 14:58  Ref. Range 05/31/2013 18:00  Hemoglobin A1C Latest Range: <5.7 % 12.2 (H)   Results for GENNETTE, SHADIX (MRN [id number]) as of 06/01/2013 14:58  Ref. Range 06/01/2013 05:30  Sodium Latest Range: 136-145 mEq/L 139  Potassium Latest Range: 3.5-5.1 mEq/L 3.7  Chloride Latest Range: 96-112 mEq/L 107  CO2 Latest Range: 19-32 mEq/L 24  BUN Latest Range: 7.0-26.0 mg/dL 10  Creatinine Latest Range: 0.50-1.10 mg/dL 0.58  Calcium Latest Range: 8.4-10.5 mg/dL 8.9  GFR calc non Af Amer Latest Range: >90 mL/min 89 (L)  GFR calc Af Amer Latest Range: >90 mL/min >90  Glucose Latest Range: 70-99 mg/dl 159 (H)    Blood sugars controlled here in hospital.  HgbA1C of 12.2% indicative of poor control at home.   Long  discussion with pt regarding importance of controlling blood sugars, diet, exercise and stress management.  Pt verbalized understanding and is willing to go to diabetes education at [facility]. Will order Living Well With Diabetes book and OP Diabetes Education order.  Pt to be discharged on Novolin 70/30 insulin ($24.88 at [facility])  Discussed with MD and RN.  Thank you. Lorenda Peck, RD, LDN, CDE Inpatient Diabetes Coordinator [phone]

## 2013-06-01 NOTE — Progress Notes (Signed)
[facility] PROGRESS NOTE  Amanda [name] FGH:[id number] DOB: May 09, 1941 DOA: 05/31/2013 PCP: Kelton Pillar, MD  Assessment/Plan: #1 hyperglycemia Likely secondary to medical noncompliance versus an infectious etiology. Urinalysis worrisome for UTI. Chest x-ray is negative. Troponin was negative. Hemoglobin A1c was 12.2. CBGs have improved and ranged from 131- 235. Increase 70/30 to 55 units twice a day. Continue sliding scale insulin. Will need outpatient diabetes education. Follow.  #2 uncontrolled type 2 diabetes Hemogloblin A1c is 12.2. CBG 131 -235. Increase 70/30 to 55U BID.Outpatient diabetes education. Follow.  #3 hypertension Continue lisinopril and metoprolol.  #4 polycythemia vera Continue hydroxyurea.  #5 dehydration IV fluids.  #6 depressive disorder Stable.  #7 hyperlipidemia  LDL = 91. Statin  Code Status: Full Family Communication: Updated patient at bedside. No family present. Disposition Plan: Home when medically stable.   Consultants:  None  Procedures:  Chest x-ray 14 2014    Antibiotics:  IV Rocephin 06/01/2013  HPI/Subjective: Patient states she's feeling better.  Objective: Filed Vitals:   06/01/13 1235  BP: 141/56  Pulse: 73  Temp: 98.2 F (36.8 C)  Resp: 20    Intake/Output Summary (Last 24 hours) at 06/01/13 1445 Last data filed at 06/01/13 1302  Gross per 24 hour  Intake    600 ml  Output    400 ml  Net    200 ml   Filed Weights   05/31/13 1644 06/01/13 0700  Weight: 88.724 kg (195 lb 9.6 oz) 89.177 kg (196 lb 9.6 oz)    Exam:   General:  NAD  Cardiovascular: RRR  Respiratory: CTAB  Abdomen: Soft, nontender, nondistended, positive bowel sounds.  Musculoskeletal: 4/5 bilateral upper extremity strength, 4/ 5 bilateral lower extremity strength.  Data Reviewed: Basic Metabolic Panel:  Recent Labs Lab 05/31/13 1800 06/01/13 0530  NA 134* 139  K 4.0 3.7  CL 99 107  CO2 27 24  GLUCOSE 342* 159*   BUN 11 10  CREATININE 0.66 0.58  CALCIUM 9.4 8.9  MG 1.7  --    Liver Function Tests:  Recent Labs Lab 05/31/13 1800  AST 42*  ALT 22  ALKPHOS 128*  BILITOT 0.5  PROT 6.8  ALBUMIN 3.6   No results found for this basename: LIPASE, AMYLASE,  in the last 168 hours No results found for this basename: AMMONIA,  in the last 168 hours CBC:  Recent Labs Lab 05/31/13 1800 06/01/13 0530  WBC 17.6* 15.9*  NEUTROABS 12.8*  --   HGB 15.4* 14.4  HCT 44.9 42.6  MCV 88.7 89.1  PLT 691* 637*   Cardiac Enzymes:  Recent Labs Lab 05/31/13 1800  TROPONINI <0.30   BNP (last 3 results) No results found for this basename: PROBNP,  in the last 8760 hours CBG:  Recent Labs Lab 05/31/13 1642 05/31/13 2209 06/01/13 0729 06/01/13 1120  GLUCAP 412* 235* 131* 166*    Recent Results (from the past 240 hour(s))  MRSA PCR SCREENING     Status: None   Collection Time    05/31/13  9:47 PM      Result Value Range Status   MRSA by PCR NEGATIVE  NEGATIVE Final   Comment:            The GeneXpert MRSA Assay (FDA     approved for NASAL specimens     only), is one component of a     comprehensive MRSA colonization     surveillance program. It is not     intended to diagnose  MRSA     infection nor to guide or     monitor treatment for     MRSA infections.     Studies: X-ray Chest Pa And Lateral   05/31/2013   *RADIOLOGY REPORT*  Clinical Data: Hyperglycemia  CHEST - 2 VIEW  Comparison: 05/24/2013  Findings: Lung volumes are low with crowding of the bronchovascular markings. Heart size is normal.  No focal pulmonary opacity.  No pleural effusion.  No acute osseous abnormality.  Stable mild nonspecific prominence of the interstitial markings.  IMPRESSION: Stable mild nonspecific prominence of the interstitial markings.   Original Report Authenticated By: Conchita Paris, M.D.    Scheduled Meds: . aspirin EC  81 mg Oral Daily  . cefTRIAXone (ROCEPHIN)  IV  1 g Intravenous Q24H  .  enoxaparin (LOVENOX) injection  40 mg Subcutaneous Q24H  . hydroxyurea  500 mg Oral BID  . insulin aspart  0-15 Units Subcutaneous TID WC  . insulin aspart protamine- aspart  55 Units Subcutaneous BID WC  . lisinopril  20 mg Oral Daily  . metoprolol  100 mg Oral QHS  . simvastatin  40 mg Oral q1800   Continuous Infusions: . sodium chloride 125 mL/hr (06/01/13 1020)    Principal Problem:   Hyperglycemia Active Problems:   DIABETES MELLITUS, TYPE II   HYPERLIPIDEMIA   POLYCYTHEMIA   Depressive disorder, not elsewhere classified   Essential hypertension, benign   History of Bell's palsy   Gout   [name](238.4)   HTN (hypertension)   UTI (urinary tract infection)    Time spent: > 35 mins    [facility] Pager [phone]. If 7PM-7AM, please contact night-coverage at [email or web address], password [email or web address] 06/01/2013, 2:45 PM  LOS: 1 day

## 2013-06-02 DIAGNOSIS — D72829 Elevated white blood cell count, unspecified: Secondary | ICD-10-CM | POA: Diagnosis present

## 2013-06-02 LAB — CBC
MCH: 30.3 pg (ref 26.0–34.0)
MCV: 90 fL (ref 78.0–100.0)
Platelets: 565 10*3/uL — ABNORMAL HIGH (ref 150–400)
RDW: 14.3 % (ref 11.5–15.5)

## 2013-06-02 LAB — BASIC METABOLIC PANEL
BUN: 12 mg/dL (ref 6–23)
CO2: 25 mEq/L (ref 19–32)
Calcium: 8.7 mg/dL (ref 8.4–10.5)
Creatinine, Ser: 0.68 mg/dL (ref 0.50–1.10)
Glucose, Bld: 107 mg/dL — ABNORMAL HIGH (ref 70–99)

## 2013-06-02 MED ORDER — LOPERAMIDE HCL 2 MG PO CAPS
4.0000 mg | ORAL_CAPSULE | Freq: Once | ORAL | Status: AC
  Administered 2013-06-02: 4 mg via ORAL
  Filled 2013-06-02: qty 2

## 2013-06-02 MED ORDER — LISINOPRIL 40 MG PO TABS: 40.0000 mg | ORAL_TABLET | Freq: Every day | ORAL | Status: DC

## 2013-06-02 MED ORDER — INSULIN NPH ISOPHANE & REGULAR (70-30) 100 UNIT/ML ~~LOC~~ SUSP: 55.0000 [IU] | Freq: Two times a day (BID) | SUBCUTANEOUS | Status: DC

## 2013-06-02 MED ORDER — LISINOPRIL 40 MG PO TABS
40.0000 mg | ORAL_TABLET | Freq: Every day | ORAL | Status: DC
  Administered 2013-06-02: 40 mg via ORAL
  Filled 2013-06-02: qty 1

## 2013-06-02 NOTE — Discharge Summary (Signed)
Physician Discharge Summary  Amanda [name] LHT:[id number] DOB: [birth year] DOA: 05/31/2013  PCP: Kelton Pillar, MD  Admit date: 05/31/2013 Discharge date: 06/02/2013  Time spent: 60 minutes  Recommendations for Outpatient Follow-up:  1. Follow up with SCHOENHOFF,DEBBIE, MD in 1 week. On followup patient will need a basic metabolic profile done to followup on electrolytes and renal function. Patient's diabetes will need to be assessed. Patient has been set up for outpatient diabetes education. Patient's blood pressure will also need to be reassessed as patient is and being discharged on lisinopril 40 mg daily and her home dose Lopressor. HCTZ has been discontinued.   Discharge Diagnoses:  Principal Problem:   Hyperglycemia Active Problems:   DIABETES MELLITUS, TYPE II   HYPERLIPIDEMIA   POLYCYTHEMIA   Depressive disorder, not elsewhere classified   Essential hypertension, benign   History of Bell's palsy   Gout   [name](238.4)   HTN (hypertension)   Bacteria in urine   Leukocytosis, unspecified   Discharge Condition: Stable and improved  Diet recommendation: Carb modified  Filed Weights   05/31/13 1644 06/01/13 0700 06/02/13 0529  Weight: 88.724 kg (195 lb 9.6 oz) 89.177 kg (196 lb 9.6 oz) 90.493 kg (199 lb 8 oz)    History of present illness:  Amanda [name] is a 72 y.o. female with history of hypertension, hyperlipidemia, insulin-dependent diabetes mellitus, depression, polycythemia, obesity, gout who presents as a direct admit from PCPs office secondary to elevated blood glucose Levels of 500s.  Patient states over the past week her sugars are ranging anywhere from 300 to the 600s. Patient was seen in the ED a few days ago consisted that her blood sugar was brought down from the 600 to the 200s and she was subsequently discharged home. Patient stated that she been seen her PCP for elevated sugars and was present at that today her blood sugars were in the 500s and  was instructed to present to the hospital.  Patient denies any chest pain, no shortness of breath, no fever, no chills, no nausea, no vomiting, no abdominal pain, no dysuria, no weakness, no cough. Patient does endorse some polyuria and polydipsia. Patient stated that was posterior tibial signals are however unable to afford that and as such has not been on that. Patient states she's been changed from NovoLog to Humalog she is on 70/25 and take that 3 times a day. Patient states has been taking her insulin as prescribed. Patient also stated that she was started on something with an InV however unable afford this as well. Patient states metformin causes diarrhea.   Hospital Course:  #1 hyperglycemia  Likely secondary to medical noncompliance and dietry indiscretion. Patient was admitted with elevated CBGs which on presentation was in the 400s. Chest x-ray was done which was negative for any acute infiltrate. Urinalysis was done with concerns for possible UTI and a such patient was started and apparently on IV Rocephin. Troponin was ordered which was negative. EKG had no acute abnormalities. Hemoglobin A1c which was obtained came back elevated at 12.2. Patient was hydrated with IV fluids and the HCTZ was discontinued. Patient was subsequently started on Novolog 70/30 insulin at 50 units twice daily. Patient was also placed on a sliding scale insulin. Patient CBGs improved and 70/30 was up titrated to 55 units twice daily. Patient CBGs on 70/3055 units twice daily ranged from 96-172. Urine cultures came back with multiple bacterial morphotypes and was felt patient likely had bacteriuria. IV antibiotics have been discontinued and no  further antibiotic use as needed. Patient improved clinically and be discharged home in stable and improved condition. Outpatient diabetes education has been arranged for the patient to followup as outpatient. Patient also followup with PCP as outpatient. Patient will be discharged in  stable and improved condition.  #2 uncontrolled type 2 diabetes  Hemogloblin A1c is 12.2. CBGs improved on Novolog 70/30 . Patient CBGs ranged from 96-172. Patient will have outpatient diabetes education which has been arranged. Patient will also followup with PCP one week post discharge. Diet to be stressed.  #3 hypertension  patient during the hospitalization was maintained on metoprolol as well as lisinopril. Patient's HCTZ was discontinued as patient was clinically dehydrated on presentation. Patient was hydrated with IV fluids. Patient's lisinopril dose has been increased to 40 mg daily. Patient with discharged home on metoprolol 100 mg daily as well as lisinopril 40 mg daily. Patient will followup with PCP as outpatient. #4 polycythemia vera  Continued on hydroxyurea.  #5 dehydration  IV fluids.  #6 depressive disorder  Stable.  #7 hyperlipidemia  LDL = 91. Maintained on Statin   Procedures: Chest x-ray 8/14/ 2014   Consultations:  None  Discharge Exam: Filed Vitals:   06/02/13 1223  BP: 146/47  Pulse:   Temp:   Resp:     General: NAD Cardiovascular: RRR Respiratory: CTAB  Discharge Instructions      Discharge Orders   Future Appointments Provider Department Dept Phone   06/20/2013 8:00 AM [place] [phone]   06/20/2013 8:30 AM [place] [phone]   08/20/2013 8:15 AM [place] [phone]   08/20/2013 8:45 AM Maryanna Shape, NP [place] [facility] [phone]   08/20/2013 9:45 AM [facility] [place] [facility] [phone]   Future Orders Complete By Expires   Ambulatory referral to Nutrition and Diabetic Education  As directed    Diet Carb Modified  As directed    Discharge instructions  As directed    Comments:     Follow up with SCHOENHOFF,DEBBIE, MD in 1  week Follow up with outpatient diabetes education. Please take medications as prescibed and please adhere to diabetic diet.   Increase activity slowly  As directed        Medication List    STOP taking these medications       HUMALOG MIX 75/25 KWIKPEN (75-25) 100 UNIT/ML Supn  Generic drug:  Insulin Lispro Prot & Lispro     lisinopril-hydrochlorothiazide 20-12.5 MG per tablet  Commonly known as:  PRINZIDE,ZESTORETIC      TAKE these medications       aspirin 81 MG EC tablet  Take 81 mg by mouth daily.     cholecalciferol 1000 UNITS tablet  Commonly known as:  VITAMIN D  Take 1,000 Units by mouth daily.     erythromycin ophthalmic ointment  Place 1 application into the left eye every 4 (four) hours as needed. For pink eye     hydroxyurea 500 MG capsule  Commonly known as:  HYDREA  Take 500 mg by mouth 2 (two) times daily.     indomethacin 25 MG capsule  Commonly known as:  INDOCIN  Take 25 mg by mouth 2 (two) times daily with a meal.     insulin NPH-regular (70-30) 100 UNIT/ML injection  Commonly known as:  NOVOLIN 70/30  Inject 55 Units into the  skin 2 (two) times daily with a meal.     lisinopril 40 MG tablet  Commonly known as:  PRINIVIL,ZESTRIL  Take 1 tablet (40 mg total) by mouth daily.     metoprolol 100 MG tablet  Commonly known as:  LOPRESSOR  Take 100 mg by mouth daily.     pravastatin 40 MG tablet  Commonly known as:  PRAVACHOL  Take 40 mg by mouth daily.       Allergies  Allergen Reactions  . Doxycycline Hives, Swelling and Rash  . Latex Hives, Itching and Rash   Follow-up Information   Follow up with SCHOENHOFF,DEBBIE, MD. Schedule an appointment as soon as possible for a visit in 1 week.   Specialty:  Internal Medicine   Contact information:   [place] [address] [phone]        The results of significant diagnostics from this hospitalization (including imaging, microbiology, ancillary and  laboratory) are listed below for reference.    Significant Diagnostic Studies: X-ray Chest Pa And Lateral   05/31/2013   *RADIOLOGY REPORT*  Clinical Data: Hyperglycemia  CHEST - 2 VIEW  Comparison: 05/24/2013  Findings: Lung volumes are low with crowding of the bronchovascular markings. Heart size is normal.  No focal pulmonary opacity.  No pleural effusion.  No acute osseous abnormality.  Stable mild nonspecific prominence of the interstitial markings.  IMPRESSION: Stable mild nonspecific prominence of the interstitial markings.   Original Report Authenticated By: Conchita Paris, M.D.   Dg Chest 2 View  05/24/2013   *RADIOLOGY REPORT*  Clinical Data: Hyperglycemia, history of hypertension  CHEST - 2 VIEW  Comparison: None.  Findings: No active infiltrate or effusion is seen.  Mild basilar linear atelectasis is noted.  There is cardiomegaly present.  There is some peribronchial thickening which may indicate bronchitis.  No acute bony abnormality is seen.  IMPRESSION: No pneumonia.  Question bronchitis.  Mild cardiomegaly.   Original Report Authenticated By: Ivar Drape, M.D.    Microbiology: Recent Results (from the past 240 hour(s))  URINE CULTURE     Status: None   Collection Time    05/31/13  7:30 PM      Result Value Range Status   Specimen Description URINE, RANDOM   Final   Special Requests NONE   Final   Culture  Setup Time     Final   Value: 06/01/2013 03:26     Performed at [place]     Final   Value: >=100,000 COLONIES/ML     Performed at [facility]   Culture     Final   Value: Multiple bacterial morphotypes present, none predominant. Suggest appropriate recollection if clinically indicated.     Performed at [facility]   Report Status 06/01/2013 FINAL   Final  MRSA PCR SCREENING     Status: None   Collection Time    05/31/13  9:47 PM      Result Value Range Status   MRSA by PCR NEGATIVE  NEGATIVE Final   Comment:            The  GeneXpert MRSA Assay (FDA     approved for NASAL specimens     only), is one component of a     comprehensive MRSA colonization     surveillance program. It is not     intended to diagnose MRSA     infection nor to guide or  monitor treatment for     MRSA infections.     Labs: Basic Metabolic Panel:  Recent Labs Lab 05/31/13 1800 06/01/13 0530 06/02/13 0618  NA 134* 139 139  K 4.0 3.7 3.9  CL 99 107 107  CO2 27 24 25   GLUCOSE 342* 159* 107*  BUN 11 10 12   CREATININE 0.66 0.58 0.68  CALCIUM 9.4 8.9 8.7  MG 1.7  --   --    Liver Function Tests:  Recent Labs Lab 05/31/13 1800  AST 42*  ALT 22  ALKPHOS 128*  BILITOT 0.5  PROT 6.8  ALBUMIN 3.6   No results found for this basename: LIPASE, AMYLASE,  in the last 168 hours No results found for this basename: AMMONIA,  in the last 168 hours CBC:  Recent Labs Lab 05/31/13 1800 06/01/13 0530 06/02/13 0618  WBC 17.6* 15.9* 15.1*  NEUTROABS 12.8*  --   --   HGB 15.4* 14.4 13.9  HCT 44.9 42.6 41.2  MCV 88.7 89.1 90.0  PLT 691* 637* 565*   Cardiac Enzymes:  Recent Labs Lab 05/31/13 1800  TROPONINI <0.30   BNP: BNP (last 3 results) No results found for this basename: PROBNP,  in the last 8760 hours CBG:  Recent Labs Lab 06/01/13 1120 06/01/13 1636 06/01/13 2159 06/02/13 0658 06/02/13 1112  GLUCAP 166* 172* 96 119* 168*       Signed:  THOMPSON,DANIEL  [facility] 06/02/2013, 12:26 PM

## 2013-06-02 NOTE — Progress Notes (Signed)
Asked by MD to discuss outpt mental health and community resources with Pt.  Discussed with Pt outpt mental health resources, including psychiatrists and therapists, and provided a list of these providers to Pt.  RN mentioned [employer] to Pt.  Pt was interested in this resource.  CSW provided Pt with information on [facility] was grateful.  CSW thanked Pt for her time.  Bernita Raisin, [place] [employer] [phone]

## 2013-06-04 ENCOUNTER — Telehealth: Payer: Self-pay | Admitting: Internal Medicine

## 2013-06-04 ENCOUNTER — Telehealth: Payer: Self-pay | Admitting: *Deleted

## 2013-06-04 ENCOUNTER — Encounter: Payer: Self-pay | Admitting: *Deleted

## 2013-06-04 MED ORDER — "INSULIN SYRINGE 29G X 1/2"" 1 ML MISC": 55.0000 [IU] | Freq: Two times a day (BID) | Status: DC

## 2013-06-04 NOTE — Telephone Encounter (Signed)
Reviewed mediations and correct dosages as well as times. Instructed pt yo bring her log and all medications with her Pt will come in Thursday 08/21 for follow up

## 2013-06-04 NOTE — Telephone Encounter (Signed)
Lachanda needs BD safety glide syringes called into [facility] on [address].

## 2013-06-04 NOTE — Telephone Encounter (Signed)
LVM to return call.

## 2013-06-04 NOTE — Telephone Encounter (Signed)
Amanda [name]    Call pt and check on her and make sure she is following discharge instructions for her insulin 70/30  55 units bid before meals  Have her document blood glucoses  Before breakfast and 2 hours or more after dinner or  (before bedtime) .  Gearldine Bienenstock her a 30 min appt this week  Have her bring all meds with her along with her documented glucoses

## 2013-06-07 ENCOUNTER — Encounter: Payer: Self-pay | Admitting: Internal Medicine

## 2013-06-07 ENCOUNTER — Ambulatory Visit (INDEPENDENT_AMBULATORY_CARE_PROVIDER_SITE_OTHER): Payer: Medicare Other | Admitting: Internal Medicine

## 2013-06-07 VITALS — BP 144/64 | HR 76 | Temp 97.7°F | Resp 18 | Wt 199.0 lb

## 2013-06-07 DIAGNOSIS — E119 Type 2 diabetes mellitus without complications: Secondary | ICD-10-CM

## 2013-06-07 DIAGNOSIS — I10 Essential (primary) hypertension: Secondary | ICD-10-CM

## 2013-06-07 DIAGNOSIS — D751 Secondary polycythemia: Secondary | ICD-10-CM

## 2013-06-07 NOTE — Progress Notes (Signed)
Subjective:    Patient ID: Amanda [name], female    DOB: 17-Jun-1941, 72 y.o.   MRN: [id number]  HPI Amanda [name] is here for hospital follow up [facility]  8/14-8/16  For uncontrolled Diabetes.  See discharge medication list.  She cannot afford the novolog 70/30 and she reports the pharmacist changed her to novolin 70/30  55 units bid.    She brings a log of her glucoses as she was asked and most glucoses are in the 200-300 range.  Fasting glucose the am was 248  Her BP med is now changed to Lisnopril 40 mg and metoprolol 100 mg daily  When asking about depression in the past ,  She was treated with Prozac and [name] tells me she was depressed when she first moved to [facility] but denies sadness, crying, anhedonia now.    She is asking to see a new endocrinologist.   She has not been to diabetes education class and is waiting for a return phone call  [name] states she is feeling better  But cannot afford many of new diabetes meds  Allergies  Allergen Reactions  . Doxycycline Hives, Swelling and Rash  . Latex Hives, Itching and Rash   Past Medical History  Diagnosis Date  . Hypertension   . Hyperlipidemia   . Diabetes mellitus 2008  . Depression   . Obesity   . Psoriasis   . Polycythemia     Dr. Elease Hashimoto- [facility]  . Herpes simplex   . Gout   . Hyperglycemia 05/31/2013  . 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 HTN (hypertension) 05/31/2013   Past Surgical History  Procedure Laterality Date  . Breast surgery      milk duct  . Total abdominal hysterectomy w/ bilateral salpingoophorectomy      for heavy periods  . Tonsillectomy     History   Social History  . Marital Status: Married    Spouse Name: N/A    Number of Children: N/A  . Years of Education: N/A   Occupational History  . Not on file.   Social History Main Topics  . Smoking status: Former Smoker -- 60 years    Quit date: 10/19/1991  . Smokeless tobacco: Never Used  . Alcohol Use: No  . Drug Use: No  . Sexual  Activity: Yes    Birth Control/ Protection: None   Other Topics Concern  . Not on file   Social History Narrative  . No narrative on file   Family History  Problem Relation Age of Onset  . Diabetes Mother   . Heart disease Mother     CAD  . Hyperlipidemia Mother   . Hypertension Mother   . Kidney disease Mother   . Kidney disease Father   . Cancer Other     breast   Patient Active Problem List   Diagnosis Date Noted  . Type II or unspecified type diabetes mellitus without mention of complication, uncontrolled 06/07/2013  . Leukocytosis, unspecified 06/02/2013  . Bacteria in urine 06/01/2013  . Hyperglycemia 05/31/2013  . Gout 05/31/2013  . 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 HTN (hypertension) 05/31/2013  . History of Bell's palsy 05/26/2013  . DERMATITIS, ATOPIC 11/16/2010  . DIZZINESS 11/16/2010  . WEIGHT GAIN 11/16/2010  . HERPETIC WHITLOW 06/30/2010  . HYPERGLYCEMIA 05/07/2010  . DIASTOLIC DYSFUNCTION 70/96/2836  . DYSPNEA 02/17/2010  . ABSCESS, SKIN 12/22/2009  . SKIN RASH 12/22/2009  . HYPERLIPIDEMIA 12/15/2009  . DIABETES MELLITUS, TYPE II 11/24/2009  . POLYCYTHEMIA 11/24/2009  .  Essential hypertension, benign 11/24/2009  . PSORIASIS 11/24/2009  . Depressive disorder, not elsewhere classified 09/13/2009  . ABSCESS, LEG 09/13/2009  . SPRAIN/STRAIN, ANKLE NOS 01/21/2009   Current Outpatient Prescriptions on File Prior to Visit  Medication Sig Dispense Refill  . aspirin 81 MG EC tablet Take 81 mg by mouth daily.        . cholecalciferol (VITAMIN D) 1000 UNITS tablet Take 1,000 Units by mouth daily.      . hydroxyurea (HYDREA) 500 MG capsule Take 500 mg by mouth 2 (two) times daily.       . insulin NPH-regular (NOVOLIN 70/30) (70-30) 100 UNIT/ML injection Inject 55 Units into the skin 2 (two) times daily with a meal.  30 mL  0  . INSULIN SYRINGE 1CC/29G (SAFETY-LOK INSULIN SYR 1CC/29G) 29G X 1/2" 1 ML MISC 55 Units by Does not apply route 2 (two) times  daily.  100 each  6  . lisinopril (PRINIVIL,ZESTRIL) 40 MG tablet Take 1 tablet (40 mg total) by mouth daily.  30 tablet  0  . metoprolol (LOPRESSOR) 100 MG tablet Take 100 mg by mouth daily.      . pravastatin (PRAVACHOL) 40 MG tablet Take 40 mg by mouth daily.       No current facility-administered medications on file prior to visit.       Review of Systems See HPI    Objective:   Physical Exam Physical Exam  Nursing note and vitals reviewed.   Accucheck in office 403 Constitutional: She is oriented to person, place, and time. She appears well-developed and well-nourished.  HENT:  Head: Normocephalic and atraumatic.  Cardiovascular: Normal rate and regular rhythm. Exam reveals no gallop and no friction rub.  No murmur heard.  Pulmonary/Chest: Breath sounds normal. She has no wheezes. She has no rales.  Neurological: She is alert and oriented to person, place, and time.  Skin: Skin is warm and dry.  Psychiatric: She has a normal mood and affect. Her behavior is normal.         Assessment & Plan:  Diabetes out of control:  Will give Humalog 4 units in office.  Increase Novolin to 58 units before breakfast and before her dinner.  Will refer to new endocrinologist.    HTN:  Continue ACE and beta blocker  Hyperlipidemia  Continue Pravastatin  Will refer for [facility] assessment,  She is awaiting Diabetes education classes

## 2013-06-09 MED ORDER — INSULIN NPH ISOPHANE & REGULAR (70-30) 100 UNIT/ML ~~LOC~~ SUSP: SUBCUTANEOUS | Status: DC

## 2013-06-12 ENCOUNTER — Other Ambulatory Visit: Payer: Self-pay | Admitting: *Deleted

## 2013-06-20 ENCOUNTER — Ambulatory Visit: Payer: Medicare Other

## 2013-06-20 ENCOUNTER — Other Ambulatory Visit (HOSPITAL_BASED_OUTPATIENT_CLINIC_OR_DEPARTMENT_OTHER): Payer: Medicare Other | Admitting: Lab

## 2013-06-20 DIAGNOSIS — D751 Secondary polycythemia: Secondary | ICD-10-CM

## 2013-06-20 LAB — CBC WITH DIFFERENTIAL/PLATELET
Eosinophils Absolute: 0.3 10*3/uL (ref 0.0–0.5)
MONO#: 0.4 10*3/uL (ref 0.1–0.9)
NEUT#: 6.1 10*3/uL (ref 1.5–6.5)
RBC: 4.55 10*6/uL (ref 3.70–5.45)
RDW: 14 % (ref 11.2–14.5)
WBC: 8.7 10*3/uL (ref 3.9–10.3)
lymph#: 1.8 10*3/uL (ref 0.9–3.3)

## 2013-06-20 NOTE — Progress Notes (Signed)
Pt in for phlebotomy today.  Hct < 45 so treatment held.  Pt made aware and states that she is feeling fine.  Pt instructed to call with questions or concerns.  Pt verbalized understanding

## 2013-07-09 ENCOUNTER — Encounter: Payer: Self-pay | Admitting: Internal Medicine

## 2013-07-09 ENCOUNTER — Ambulatory Visit (INDEPENDENT_AMBULATORY_CARE_PROVIDER_SITE_OTHER): Payer: Medicare Other | Admitting: Internal Medicine

## 2013-07-09 VITALS — BP 134/64 | HR 66 | Temp 98.0°F | Resp 18 | Wt 202.0 lb

## 2013-07-09 DIAGNOSIS — R609 Edema, unspecified: Secondary | ICD-10-CM

## 2013-07-09 DIAGNOSIS — K5900 Constipation, unspecified: Secondary | ICD-10-CM

## 2013-07-09 DIAGNOSIS — I10 Essential (primary) hypertension: Secondary | ICD-10-CM

## 2013-07-09 DIAGNOSIS — Z23 Encounter for immunization: Secondary | ICD-10-CM

## 2013-07-09 DIAGNOSIS — E119 Type 2 diabetes mellitus without complications: Secondary | ICD-10-CM

## 2013-07-09 LAB — GLUCOSE, POCT (MANUAL RESULT ENTRY): POC Glucose: 245 mg/dl — AB (ref 70–99)

## 2013-07-09 MED ORDER — LISINOPRIL 40 MG PO TABS: 40.0000 mg | ORAL_TABLET | Freq: Every day | ORAL | Status: DC

## 2013-07-09 MED ORDER — HYDROCHLOROTHIAZIDE 25 MG PO TABS: ORAL_TABLET | ORAL | Status: DC

## 2013-07-09 NOTE — Patient Instructions (Addendum)
Take blood pressure medicine every day  Take water pill every other day for 3 days only  Keep appt with Dr. Cruzita Lederer on Friday   See me in 3 months  Get Colace stool softener  Take one capsule twice a day with food  Get Miralax and use as directed

## 2013-07-09 NOTE — Addendum Note (Signed)
Addended by: Conley Rolls on: 07/09/2013 02:56 PM   Modules accepted: Orders

## 2013-07-09 NOTE — Progress Notes (Signed)
Subjective:    Patient ID: Amanda [name], female    DOB: October 03, 1941, 72 y.o.   MRN: [id number]  HPI  Sima is here for follow up .  She still will forget to take her insulin but she is getting better -she did not take her insulin this am.  She is on 58 units of  novolin  (more affordable)  Bid.  She brings her glucose log.  Most glucoses around 200 occasionally will be less  She has not seen diabetes educator but has an upcoming appt.  She also will see Dr. Cruzita Lederer this week  Now that she is off Metformin she is constipated  She has been out of her lisinopril for the past 2-3 week  The home health nurse told pt she was "swollen"  In her legs.  Pt does not really note this  Stress is better in life - husband has a job and pt has a car now  Allergies  Allergen Reactions  . Doxycycline Hives, Swelling and Rash  . Latex Hives, Itching and Rash   Past Medical History  Diagnosis Date  . Hypertension   . Hyperlipidemia   . Diabetes mellitus 2008  . Depression   . Obesity   . Psoriasis   . Polycythemia     Dr. Elease Hashimoto- [facility]  . Herpes simplex   . Gout   . Hyperglycemia 05/31/2013  . 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 HTN (hypertension) 05/31/2013   Past Surgical History  Procedure Laterality Date  . Breast surgery      milk duct  . Total abdominal hysterectomy w/ bilateral salpingoophorectomy      for heavy periods  . Tonsillectomy     History   Social History  . Marital Status: Married    Spouse Name: N/A    Number of Children: N/A  . Years of Education: N/A   Occupational History  . Not on file.   Social History Main Topics  . Smoking status: Former Smoker -- 58 years    Quit date: 10/19/1991  . Smokeless tobacco: Never Used  . Alcohol Use: No  . Drug Use: No  . Sexual Activity: Yes    Birth Control/ Protection: None   Other Topics Concern  . Not on file   Social History Narrative  . No narrative on file   Family History  Problem Relation  Age of Onset  . Diabetes Mother   . Heart disease Mother     CAD  . Hyperlipidemia Mother   . Hypertension Mother   . Kidney disease Mother   . Kidney disease Father   . Cancer Other     breast   Patient Active Problem List   Diagnosis Date Noted  . Edema 07/09/2013  . Constipation 07/09/2013  . Type II or unspecified type diabetes mellitus without mention of complication, uncontrolled 06/07/2013  . Leukocytosis, unspecified 06/02/2013  . Bacteria in urine 06/01/2013  . Hyperglycemia 05/31/2013  . Gout 05/31/2013  . 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 HTN (hypertension) 05/31/2013  . History of Bell's palsy 05/26/2013  . DERMATITIS, ATOPIC 11/16/2010  . DIZZINESS 11/16/2010  . WEIGHT GAIN 11/16/2010  . HERPETIC WHITLOW 06/30/2010  . HYPERGLYCEMIA 05/07/2010  . DIASTOLIC DYSFUNCTION 99/83/3825  . DYSPNEA 02/17/2010  . ABSCESS, SKIN 12/22/2009  . SKIN RASH 12/22/2009  . HYPERLIPIDEMIA 12/15/2009  . DIABETES MELLITUS, TYPE II 11/24/2009  . POLYCYTHEMIA 11/24/2009  . Essential hypertension, benign 11/24/2009  . PSORIASIS 11/24/2009  . Depressive disorder, not  elsewhere classified 09/13/2009  . ABSCESS, LEG 09/13/2009  . SPRAIN/STRAIN, ANKLE NOS 01/21/2009   Current Outpatient Prescriptions on File Prior to Visit  Medication Sig Dispense Refill  . aspirin 81 MG EC tablet Take 81 mg by mouth daily.        . cholecalciferol (VITAMIN D) 1000 UNITS tablet Take 1,000 Units by mouth daily.      . hydroxyurea (HYDREA) 500 MG capsule Take 500 mg by mouth 2 (two) times daily.       . insulin NPH-regular (NOVOLIN 70/30) (70-30) 100 UNIT/ML injection INject 58 units before breakfast and dinner  30 mL  3  . INSULIN SYRINGE 1CC/29G (SAFETY-LOK INSULIN SYR 1CC/29G) 29G X 1/2" 1 ML MISC 55 Units by Does not apply route 2 (two) times daily.  100 each  6  . metoprolol (LOPRESSOR) 100 MG tablet Take 100 mg by mouth daily.      . pravastatin (PRAVACHOL) 40 MG tablet Take 40 mg by mouth  daily.       No current facility-administered medications on file prior to visit.      Review of Systems See HPI    Objective:   Physical Exam Physical Exam  Nursing note and vitals reviewed.  Constitutional: She is oriented to person, place, and time. She appears well-developed and well-nourished.  HENT:  Head: Normocephalic and atraumatic.  Cardiovascular: Normal rate and regular rhythm. Exam reveals no gallop and no friction rub.  No murmur heard.  Pulmonary/Chest: Breath sounds normal. She has no wheezes. She has no rales.  Neurological: She is alert and oriented to person, place, and time.  Ext  1+ edema bilaterally Skin: Skin is warm and dry.  Psychiatric: She has a normal mood and affect. Her behavior is normal.             Assessment & Plan:  HTN  Continue lisinopril and metoprolol  BP accepatable today  Diabetes  Will defer to endocrinology to adjust her meds.  She is on limited income so cost is an issue for her  Continue Novolin 70/30 58 units bid  Constipation  Colace daily and miralax prn  Edema will give HCTz 25 mg for 3 days only  See me in 3 months

## 2013-07-13 ENCOUNTER — Ambulatory Visit (INDEPENDENT_AMBULATORY_CARE_PROVIDER_SITE_OTHER): Payer: Medicare Other | Admitting: Internal Medicine

## 2013-07-13 ENCOUNTER — Other Ambulatory Visit: Payer: Self-pay | Admitting: *Deleted

## 2013-07-13 ENCOUNTER — Encounter: Payer: Self-pay | Admitting: Internal Medicine

## 2013-07-13 VITALS — BP 138/84 | HR 76 | Temp 98.0°F | Resp 14 | Ht 64.0 in | Wt 203.0 lb

## 2013-07-13 DIAGNOSIS — IMO0001 Reserved for inherently not codable concepts without codable children: Secondary | ICD-10-CM

## 2013-07-13 LAB — HM DIABETES FOOT EXAM: HM Diabetic Foot Exam: NORMAL

## 2013-07-13 MED ORDER — INSULIN PEN NEEDLE 31G X 5 MM MISC: Status: DC

## 2013-07-13 MED ORDER — INSULIN ISOPHANE & REGULAR (HUMAN 70-30)100 UNIT/ML KWIKPEN: 120.0000 [IU] | PEN_INJECTOR | Freq: Every day | SUBCUTANEOUS | Status: DC

## 2013-07-13 MED ORDER — LISINOPRIL 40 MG PO TABS: 40.0000 mg | ORAL_TABLET | Freq: Every day | ORAL | Status: DC

## 2013-07-13 MED ORDER — HYDROCHLOROTHIAZIDE 25 MG PO TABS: ORAL_TABLET | ORAL | Status: DC

## 2013-07-13 NOTE — Progress Notes (Signed)
Patient ID: Amanda [name], female   DOB: [birth year], 72 y.o.   MRN: [id number]  HPI: Amanda [name] is a 72 y.o.-year-old female, referred by her PCP, Dr. Coralyn [name], for management of DM2, insulin-dependent, uncontrolled, with complications (DR, PN). She was previously seen by Dr. Elyse [name] since 2012. She is here accompanied by her husband.  Patient has been diagnosed with diabetes in 2008;  she started insulin in 2008, after she failed one month therapy with oral agents.  Last hemoglobin A1c was: Lab Results  Component Value Date   HGBA1C 12.2* 05/31/2013  - in 10/16/2012, hemoglobin A1c was 6.9% - in 07/2012, hemoglobin A1c was 9.9% - in 11/16/2010, hemoglobin A1c was 12.8% - in 03/2011, hemoglobin A1c was 6.9% - In 07/10/2010 her hemoglobin A1c was 10.5% C-peptide was 6.3 and 01/2011.  Per review of Dr. Altheimer's notes: The patient had problems with her insurance, which would not cover Humalog insulin.  Pt is on a regimen of: - NPH-regular (novolin) 70/30 - 58-58 units with meals - syringes  She could not tolerate doses of metformin XR higher than 250 mg twice a day in the past due to diarrhea. She does not want to resume this. Was previously on the Victoza 1.8 mg daily - "made me sick" - stopped 2 mo ago. She was previously on Janumet between 2012-2013. She was on Invokana - but could not afford it while in the Medicare doughnut hole. Per Dr. Darryl [name] notes, patient was missing approximately 4 doses of insulin a week when eating out. Also, per his review of pharmacy records in 07/2012 this showed incomplete adherence to some or all of her medicines, although she claimed full burins other than missing lunchtime insulin doses half of the time.  Pt checks her sugars 3x a day and they are: - am: 190-304 - before lunch: 220-290 - before dinner: 140-305 No lows. Lowest sugar was 93 in last 3 mo; she has hypoglycemia awareness at 100. Highest sugar was 500, but not in last 2  mo.  Pt's meals are: - Breakfast: "I am not a breakfast eater": 1 egg + toast; 3 pancakes + sugarfree syrup; waffle; french toast - not skipping anymore  - Lunch: sometimes skips, sometimes eats out: pizza, salad, no dessert - Dinner: at 5:30-6pm: soup, meat + vegetables + starch, dessert - Snacks: 8 pm: fruit, sugar free  Cannot tolerate diet sodas, but drinks diet lemonade, drinks seltzer, a lot of water. She is eating out 4 times a week. She was seen for nutritional advice and diabetes education, with last visit on 04/2012 - Amanda [name].  - no CKD, last BUN/creatinine:  Lab Results  Component Value Date   BUN 12 06/02/2013   CREATININE 0.68 06/02/2013  She is on ACEI. Last microalbumin to creatinine ratio was 19.2 in 09/2012. - last set of lipids: Lab Results  Component Value Date   CHOL 167 06/01/2013   HDL 21* 06/01/2013   LDLCALC 91 06/01/2013   TRIG 275* 06/01/2013   CHOLHDL 8.0 06/01/2013  She is on Pravastatin. - last eye exam was in 02/2012. 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 She is on aspirin 81 mg daily.  I reviewed her chart and she also has a history of hypertension- on HCTZ, Metoprolol, also diastolic dysfunction, hyperlipidemia, polycytemia vera - on hydroxyurea, gout, psoriasis, depression, history of Bell's palsy, obesity, vitamin D deficiency.  Pt has FH of DM in mother, pounds, cousins,  and parents.  ROS: Constitutional: increased appetite, weight gain/weight loss, feeling hot, poor sleep Eyes: + blurry vision, no xerophthalmia ENT: no sore throat, no nodules palpated in throat, no dysphagia/odynophagia, no hoarseness; Tinnitus Cardiovascular: no CP/SOB/palpitations/+ leg and hand swelling Respiratory: +cough/no SOB Gastrointestinal: no N/V/+D/+C Musculoskeletal: + all: muscle/joint aches Skin: no rashes, + itching Neurological: no tremors/numbness/tingling/dizziness, + headache Psychiatric: no  depression/anxiety  Past Medical History  Diagnosis Date  . Hypertension   . Hyperlipidemia   . Diabetes mellitus 2008  . Depression   . Obesity   . Psoriasis   . Polycythemia     Amanda [name]- [facility]  . Herpes simplex   . Gout   . Hyperglycemia 05/31/2013  . 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 HTN (hypertension) 05/31/2013    Past Surgical History  Procedure Laterality Date  . Breast surgery      milk duct  . Total abdominal hysterectomy w/ bilateral salpingoophorectomy      for heavy periods  . Tonsillectomy      History   Social History  . Marital Status: Married    Spouse Name: N/A    Children: yes   Occupational History  . housewife   Social History Main Topics  . Smoking status: Former Smoker -- 3 years    Quit date: 10/19/1991  . Smokeless tobacco: Never Used  . Alcohol Use: No  . Drug Use: No  . Sexual Activity: Yes    Birth Control/ Protection: None    Current Outpatient Prescriptions on File Prior to Visit  Medication Sig Dispense Refill  . aspirin 81 MG EC tablet Take 81 mg by mouth daily.        . cholecalciferol (VITAMIN D) 1000 UNITS tablet Take 1,000 Units by mouth daily.      . hydrochlorothiazide (HYDRODIURIL) 25 MG tablet Take one tablet every other day for 3 days  3 tablet  0  . hydroxyurea (HYDREA) 500 MG capsule Take 500 mg by mouth 2 (two) times daily.       . INSULIN SYRINGE 1CC/29G (SAFETY-LOK INSULIN SYR 1CC/29G) 29G X 1/2" 1 ML MISC 55 Units by Does not apply route 2 (two) times daily.  100 each  6  . lisinopril (PRINIVIL,ZESTRIL) 40 MG tablet Take 1 tablet (40 mg total) by mouth daily.  30 tablet  1  . metoprolol (LOPRESSOR) 100 MG tablet Take 100 mg by mouth daily.      . pravastatin (PRAVACHOL) 40 MG tablet Take 40 mg by mouth daily.       No current facility-administered medications on file prior to visit.    Allergies  Allergen Reactions  . Doxycycline Hives, Swelling and Rash  . Latex Hives, Itching and Rash     Family History  Problem Relation Age of Onset  . Diabetes Mother   . Heart disease Mother     CAD  . Hyperlipidemia Mother   . Hypertension Mother   . Kidney disease Mother   . Kidney disease Father   . Cancer Other     breast     PE: BP 138/84  Pulse 76  Temp(Src) 98 F (36.7 C) (Oral)  Resp 14  Ht 5' 4"  (1.626 m)  Wt 203 lb (92.08 kg)  BMI 34.83 kg/m2  SpO2 95% Wt Readings from Last 3 Encounters:  07/13/13 203 lb (92.08 kg)  07/09/13 202 lb (91.627 kg)  06/07/13 199 lb (90.266 kg)   Constitutional: overweight, in NAD Eyes: PERRLA, EOMI, no exophthalmos ENT:  moist mucous membranes, no thyromegaly, no cervical lymphadenopathy Cardiovascular: RRR, No MRG Respiratory: CTA B Gastrointestinal: abdomen soft, NT, ND, BS+ Musculoskeletal: no deformities, strength intact in all 4 Skin: moist, warm, no rashes Neurological: no tremor with outstretched hands, DTR normal in all 4 Foot exam performed today, and it is normal  ASSESSMENT: 1. 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 Patient with long-standing, uncontrolled diabetes, on premixed insulin regimen, with frequent medication noncompliance due to forgetting to take the insulin doses and due to insurance problems. She has polycythemia vera, which influences the results of the hemoglobin A1c. Indeed, in the past, her hemoglobin A1c levels have fluctuated between 6.9 and 12.8. We are very limited in the choices of her diabetic medications by her ability to afford the medicines and also her intolerances. We cannot use metformin due to diarrhea, and most of the other medicines she cannot afford, including basal and bolus analog insulins. - We discussed about options for treatment, and I suggested to try not to forget her insulin doses, and add insulin with lunch, too. Since she is injecting her insulin after meals, I advised her to move this 30 minutes before  her meals. I sent a prescription for Humulin 70/30 pens to her pharmacy, and also for smaller needles.  I am not sure if she can afford pens... We discussed about correct insulin injection techniques. Advised her to injecting the abdomen, around the umbilicus, and only use the thighs if cannot use the abdomen. Inject insulin 70/30 under skin 50 units before breakfast, 20 units before lunch and 50 units before dinner.  Please do not skip doses. Please return in 1 month with your sugar log.   Send me a message through MyChart if sugars <80 and >250 consistently. - advised her to continue checking her sugars at different times of the day - check 3 times a day, rotating checks - given sugar log and advised how to fill it and to bring it at next appt  - given foot care handout and explained the principles  - given instructions for hypoglycemia management "15-15 rule"  - advised for yearly eye exams - we'll refer to Dr. Zigmund Daniel. - She had the flu vaccine last week - Return to clinic in one month with sugar log

## 2013-07-13 NOTE — Patient Instructions (Addendum)
Inject under skin 50 units before breakfast, 20 units before lunch and 50 units before dinner. Please inject 30 minutes before the meals. Please do not skip doses. Please return in 1 month with your sugar log.  Send me a message through MyChart if sugars <80 and >250 consistently.  PATIENT INSTRUCTIONS FOR TYPE 2 DIABETES:  DIET AND EXERCISE Diet and exercise is an important part of diabetic treatment.  We recommended aerobic exercise in the form of brisk walking (working between 40-60% of maximal aerobic capacity, similar to brisk walking) for 150 minutes per week (such as 30 minutes five days per week) along with 3 times per week performing 'resistance' training (using various gauge rubber tubes with handles) 5-10 exercises involving the major muscle groups (upper body, lower body and core) performing 10-15 repetitions (or near fatigue) each exercise. Start at half the above goal but build slowly to reach the above goals. If limited by weight, joint pain, or disability, we recommend daily walking in a swimming pool with water up to waist to reduce pressure from joints while allow for adequate exercise.    BLOOD GLUCOSES Monitoring your blood glucoses is important for continued management of your diabetes. Please check your blood glucoses 2-4 times a day: fasting, before meals and at bedtime (you can rotate these measurements - e.g. one day check before the 3 meals, the next day check before 2 of the meals and before bedtime, etc.   HYPOGLYCEMIA (low blood sugar) Hypoglycemia is usually a reaction to not eating, exercising, or taking too much insulin/ other diabetes drugs.  Symptoms include tremors, sweating, hunger, confusion, headache, etc. Treat IMMEDIATELY with 15 grams of Carbs:   4 glucose tablets    cup regular juice/soda   2 tablespoons raisins   4 teaspoons sugar   1 tablespoon honey Recheck blood glucose in 15 mins and repeat above if still symptomatic/blood glucose <100. Please  contact our office at [phone] if you have questions about how to next handle your insulin.  RECOMMENDATIONS TO REDUCE YOUR RISK OF DIABETIC COMPLICATIONS: * Take your prescribed MEDICATION(S). * Follow a DIABETIC diet: Complex carbs, fiber rich foods, heart healthy fish twice weekly, (monounsaturated and polyunsaturated) fats * AVOID saturated/trans fats, high fat foods, >2,300 mg salt per day. * EXERCISE at least 5 times a week for 30 minutes or preferably daily.  * DO NOT SMOKE OR DRINK more than 1 drink a day. * Check your FEET every day. Do not wear tightfitting shoes. Contact us if you develop an ulcer * See your EYE doctor once a year or more if needed * Get a FLU shot once a year * Get a PNEUMONIA vaccine once before and once after age 19 years  GOALS:  * Your Hemoglobin A1c of <7%  * fasting sugars need to be <130 * after meals sugars need to be <180 (2h after you start eating) * Your Systolic BP should be 301 or lower  * Your Diastolic BP should be 80 or lower  * Your HDL (Good Cholesterol) should be 40 or higher  * Your LDL (Bad Cholesterol) should be 100 or lower  * Your Triglycerides should be 150 or lower  * Your Urine microalbumin (kidney function) should be <30 * Your Body Mass Index should be 25 or lower   We will be glad to help you achieve these goals. Our telephone number is: [phone].

## 2013-07-16 ENCOUNTER — Ambulatory Visit: Payer: Medicare Other | Admitting: Internal Medicine

## 2013-07-17 ENCOUNTER — Other Ambulatory Visit: Payer: Self-pay | Admitting: *Deleted

## 2013-07-17 ENCOUNTER — Ambulatory Visit: Payer: Medicare Other | Admitting: *Deleted

## 2013-07-17 MED ORDER — INSULIN NPH ISOPHANE & REGULAR (70-30) 100 UNIT/ML ~~LOC~~ SUSP: SUBCUTANEOUS | Status: DC

## 2013-07-17 MED ORDER — INSULIN PEN NEEDLE 31G X 5 MM MISC: Status: DC

## 2013-07-18 ENCOUNTER — Telehealth: Payer: Self-pay | Admitting: Internal Medicine

## 2013-07-19 ENCOUNTER — Other Ambulatory Visit: Payer: Self-pay | Admitting: *Deleted

## 2013-07-19 MED ORDER — INSULIN ISOPHANE & REGULAR (HUMAN 70-30)100 UNIT/ML KWIKPEN: 120.0000 [IU] | PEN_INJECTOR | Freq: Every day | SUBCUTANEOUS | Status: DC

## 2013-07-19 NOTE — Telephone Encounter (Signed)
Pt called needing rx for humulin until the rx comes from [facility].

## 2013-07-19 NOTE — Telephone Encounter (Signed)
Sent rx to [facility] for pt.

## 2013-07-20 ENCOUNTER — Telehealth: Payer: Self-pay | Admitting: *Deleted

## 2013-07-20 NOTE — Telephone Encounter (Signed)
[name] called to discuss cost of medications. Gave him a discount card, but it does not help with pt's on Medicare. He called back and stated that if Dr Cruzita Lederer would put his wife back on Metformin (even though she could not take it before) then the cost would put them in the doughnut hole and the could get some financial help with the Humulin. I am not sure what to tell this pt or her husband. Please advise.

## 2013-07-20 NOTE — Telephone Encounter (Signed)
Amanda [name], I am not sure either. She could not tolerate Metformin ER in the past at minimum doses, so I would not retry. She should continue with just the insulin 70/30, whichever formulation covered by insurance.

## 2013-07-26 ENCOUNTER — Encounter (INDEPENDENT_AMBULATORY_CARE_PROVIDER_SITE_OTHER): Payer: Medicare Other | Admitting: Ophthalmology

## 2013-07-26 DIAGNOSIS — E11319 Type 2 diabetes mellitus with unspecified diabetic retinopathy without macular edema: Secondary | ICD-10-CM

## 2013-07-26 DIAGNOSIS — H2510 Age-related nuclear cataract, unspecified eye: Secondary | ICD-10-CM

## 2013-07-26 DIAGNOSIS — E1139 Type 2 diabetes mellitus with other diabetic ophthalmic complication: Secondary | ICD-10-CM

## 2013-07-26 DIAGNOSIS — H43819 Vitreous degeneration, unspecified eye: Secondary | ICD-10-CM

## 2013-07-26 DIAGNOSIS — I10 Essential (primary) hypertension: Secondary | ICD-10-CM

## 2013-08-02 ENCOUNTER — Other Ambulatory Visit: Payer: Self-pay | Admitting: Oncology

## 2013-08-03 ENCOUNTER — Telehealth: Payer: Self-pay | Admitting: Hematology and Oncology

## 2013-08-03 NOTE — Telephone Encounter (Signed)
switch pt to MD per MD request.

## 2013-08-13 ENCOUNTER — Other Ambulatory Visit: Payer: Self-pay | Admitting: *Deleted

## 2013-08-13 ENCOUNTER — Encounter: Payer: Self-pay | Admitting: Internal Medicine

## 2013-08-13 ENCOUNTER — Ambulatory Visit (INDEPENDENT_AMBULATORY_CARE_PROVIDER_SITE_OTHER): Payer: Medicare Other | Admitting: Internal Medicine

## 2013-08-13 VITALS — BP 124/70 | HR 95 | Temp 97.7°F | Resp 12 | Wt 206.0 lb

## 2013-08-13 MED ORDER — LISINOPRIL 40 MG PO TABS: 40.0000 mg | ORAL_TABLET | Freq: Every day | ORAL | Status: DC

## 2013-08-13 NOTE — Telephone Encounter (Signed)
Refill request

## 2013-08-13 NOTE — Progress Notes (Signed)
Patient ID: Amanda [name], female   DOB: August 08, 1941, 72 y.o.   MRN: [id number]  HPI: Amanda [name] is a 72 y.o.-year-old female, returning for f/u for DM2, dx 2008, insulin-dependent since dx, uncontrolled, with complications (DR, PN). Last visit 1 mo ago. She is here accompanied by her husband.  Last hemoglobin A1c was: Lab Results  Component Value Date   HGBA1C 12.2* 05/31/2013  - in 10/16/2012, hemoglobin A1c was 6.9% - in 07/2012, hemoglobin A1c was 9.9% - in 11/16/2010, hemoglobin A1c was 12.8% - in 03/2011, hemoglobin A1c was 6.9% - In 07/10/2010 her hemoglobin A1c was 10.5% C-peptide was 6.3 and 01/2011.  Pt's current insulin regimen: NPH-regular insulin 70/30 under skin 50 units before breakfast, 20 units before lunch and 50 units before dinner. Cannot afford other insulins. She tells me she did not take the lunch insulin - she did not know she can keep the insulin in use out of the fridge and can take it with her.  Pt was on a regimen of: - NPH-regular (novolin) 70/30 - 58-58 units with meals - syringes  She could not tolerate doses of metformin XR higher than 250 mg twice a day in the past due to diarrhea. She does not want to resume this. Was previously on the Victoza 1.8 mg daily - "made me sick". She was previously on Janumet between 2012-2013. She was on Invokana - but could not afford it while in the Medicare doughnut hole. Per Dr. Darryl Nestle notes, patient was missing approximately 4 doses of insulin a week when eating out. Also, per his review of pharmacy records in 07/2012 this showed incomplete adherence to some or all of her medicines, although she claimed full burins other than missing lunchtime insulin doses half of the time.  Pt checks her sugars 1-3x a day and they are higher than before: - am: 190-304 >> 210-400 (lowest 140) - before lunch: 220-290 >> 200-300s - before dinner: 140-305 >> 200-300s No lows. Lowest sugar was 140 in last mo; she has hypoglycemia  awareness at 100. Highest sugar was 400s  She had a period of few days when she did not have insulin, and sugars were the same as when she was taking the insulin.  Pt's meals are: - Breakfast: "I am not a breakfast eater": 1 egg + toast; 3 pancakes + sugarfree syrup; waffle; french toast - not skipping anymore  - Lunch: sometimes skips, sometimes eats out: pizza, salad, no dessert - Dinner: at 5:30-6pm: soup, meat + vegetables + starch, dessert - Snacks: 8 pm: fruit, sugar free  She is eating out 4 times a week. She was seen for nutritional advice and diabetes education, with last visit on 04/2012 - [name].  - no CKD, last BUN/creatinine:  Lab Results  Component Value Date   BUN 12 06/02/2013   CREATININE 0.68 06/02/2013  She is on ACEI. Last microalbumin to creatinine ratio was 19.2 in 09/2012. - last set of lipids: Lab Results  Component Value Date   CHOL 167 06/01/2013   HDL 21* 06/01/2013   LDLCALC 91 06/01/2013   TRIG 275* 06/01/2013   CHOLHDL 8.0 06/01/2013  She is on Pravastatin. - last eye exam was in 07/2013. 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 At last eye exam (Dr Zigmund Daniel): cataracts. - no numbness and tingling in her feet. She is on aspirin 81 mg daily.  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  ROS: Constitutional: +weight gain, poor sleep Eyes: no blurry vision, no xerophthalmia ENT: no sore throat, no nodules palpated in throat, no dysphagia/odynophagia, no hoarseness; Tinnitus Cardiovascular: no CP/SOB/palpitations/+ leg and hand swelling Respiratory: +cough/no SOB Gastrointestinal: no N/V/+D/no C Musculoskeletal: no muscle/joint aches Skin: + rash - arms Neurological: no tremors/numbness/tingling/dizziness  I reviewed pt's medications, allergies, PMH, social hx,  family hx and no changes required, except as mentioned above.  PE: BP 124/70  Pulse 95  Temp(Src) 97.7 F (36.5 C) (Oral)  Resp 12  Wt 206 lb (93.441 kg)  BMI 35.34 kg/m2  SpO2 96% Wt Readings from Last 3 Encounters:  08/13/13 206 lb (93.441 kg)  07/13/13 203 lb (92.08 kg)  07/09/13 202 lb (91.627 kg)   Constitutional: overweight, in NAD Eyes: PERRLA, EOMI, no exophthalmos ENT: moist mucous membranes, no thyromegaly, no cervical lymphadenopathy Cardiovascular: RRR, No MRG Respiratory: CTA B Gastrointestinal: abdomen soft, NT, ND, BS+ Musculoskeletal: no deformities, strength intact in all 4 Skin: moist, warm, no rashes  ASSESSMENT: 1. 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 Patient with long-standing, uncontrolled diabetes, on premixed insulin regimen, with frequent medication noncompliance due to forgetting to take the insulin doses and due to insurance problems. She has polycythemia vera, which influences the results of the hemoglobin A1c. Indeed, in the past, her hemoglobin A1c levels have fluctuated between 6.9 and 12.8. We are very limited in the choices of her diabetic medications by her ability to afford the medicines and also her intolerances. We cannot use metformin due to diarrhea, and most of the other medicines she cannot afford, including basal and bolus analog insulins. - We discussed about options for treatment, and I suggested to try not to forget her insulin doses, and add insulin with lunch, too. She is now injecting her insulin 30 minutes before her meals.  - We again discussed about correct insulin injection techniques. Advised that she can keep the insulin in use out of the fridge. Inject insulin 70/30 under skin 50 units before breakfast, 20 units before lunch and 50 units before dinner.  Please do not skip doses. Please return in 1 month with your sugar log.   Send me a message through  MyChart if sugars <80 and >250 consistently. - advised her to continue checking her sugars at different times of the day - check 3 times a day, rotating checks - advised for yearly eye exams - just saw Dr. Zigmund Daniel cataracts, no DR - She had the flu vaccine this fall - Return to clinic in one month with sugar log

## 2013-08-13 NOTE — Patient Instructions (Addendum)
Take the NPH-regular insulin 70/30 under skin 50 units before breakfast, 20 units before lunch and 50 units before dinner. Please do not skip doses. Please return in 1 month with your sugar log.   Send me a message through MyChart if sugars <80 and >250 consistently.

## 2013-08-15 ENCOUNTER — Encounter: Payer: Self-pay | Admitting: Internal Medicine

## 2013-08-20 ENCOUNTER — Telehealth: Payer: Self-pay | Admitting: Hematology and Oncology

## 2013-08-20 ENCOUNTER — Ambulatory Visit (HOSPITAL_BASED_OUTPATIENT_CLINIC_OR_DEPARTMENT_OTHER): Payer: Medicare Other | Admitting: Hematology and Oncology

## 2013-08-20 ENCOUNTER — Ambulatory Visit: Payer: Medicare Other | Admitting: Oncology

## 2013-08-20 ENCOUNTER — Other Ambulatory Visit: Payer: Medicare Other | Admitting: Lab

## 2013-08-20 ENCOUNTER — Encounter: Payer: Self-pay | Admitting: Hematology and Oncology

## 2013-08-20 VITALS — BP 150/72 | HR 83 | Temp 97.5°F | Resp 19 | Ht 64.0 in | Wt 205.5 lb

## 2013-08-20 DIAGNOSIS — D45 Polycythemia vera: Secondary | ICD-10-CM

## 2013-08-20 DIAGNOSIS — D473 Essential (hemorrhagic) thrombocythemia: Secondary | ICD-10-CM

## 2013-08-20 NOTE — Telephone Encounter (Signed)
Gave pt appt for lab and MD on November 2014

## 2013-08-20 NOTE — Progress Notes (Signed)
[place] OFFICE PROGRESS NOTE  Patient Care Team: Lanice Shirts, MD as PCP - General (Internal Medicine) Nobie Putnam, MD (Hematology and Oncology) Juanda Bond Altheimer, MD as Attending Physician (Endocrinology)  DIAGNOSIS: Erythrocytosis   SUMMARY OF ONCOLOGIC HISTORY: This is a pleasant 72 year old lady who was found to have erythrocytosis requiring phlebotomy and tested negative for that JAK 2 to mutation. She had been treated with phlebotomy, hydroxyurea and aspirin.   INTERVAL HISTORY: Amanda [name] 72 y.o. female returns for further followup. She has been doing well. Denies any headache, shortness of breath, chest pain or leg swelling. The patient have chronic diarrhea. Denies any recent diagnosis of blood clot. She continue on aspirin. The patient denies any recent signs or symptoms of bleeding such as spontaneous epistaxis, hematuria or hematochezia. She denies any recent fever, chills, night sweats or abnormal weight loss  I have reviewed the past medical history, past surgical history, social history and family history with the patient and they are unchanged from previous note.  ALLERGIES:  is allergic to doxycycline and latex.  MEDICATIONS:  Current Outpatient Prescriptions  Medication Sig Dispense Refill  . aspirin 81 MG EC tablet Take 81 mg by mouth daily.        . cholecalciferol (VITAMIN D) 1000 UNITS tablet Take 1,000 Units by mouth daily.      . hydrochlorothiazide (HYDRODIURIL) 25 MG tablet Take one tablet every other day for 3 days  3 tablet  0  . hydroxyurea (HYDREA) 500 MG capsule Take 500 mg by mouth 2 (two) times daily.       . Insulin Isophane & Regular (HUMULIN 70/30 KWIKPEN) (70-30) 100 UNIT/ML SUPN Inject 120 Units into the skin daily. Inject under skin 50 units before breakfast, 20 units before lunch and 50 units before dinner. Please inject 30 minutes before the meals.  5 pen  0  . Insulin Pen Needle (RA PEN NEEDLES) 31G X 5 MM MISC  Use 3x a day  200 each  3  . INSULIN SYRINGE 1CC/29G (SAFETY-LOK INSULIN SYR 1CC/29G) 29G X 1/2" 1 ML MISC 55 Units by Does not apply route 2 (two) times daily.  100 each  6  . lisinopril (PRINIVIL,ZESTRIL) 40 MG tablet Take 1 tablet (40 mg total) by mouth daily.  90 tablet  3  . metoprolol (LOPRESSOR) 100 MG tablet Take 100 mg by mouth daily.      . pravastatin (PRAVACHOL) 40 MG tablet Take 40 mg by mouth daily.       No current facility-administered medications for this visit.    REVIEW OF SYSTEMS:   Constitutional: Denies fevers, chills or abnormal weight loss Eyes: Denies blurriness of vision Ears, nose, mouth, throat, and face: Denies mucositis or sore throat Respiratory: Denies cough, dyspnea or wheezes Cardiovascular: Denies palpitation, chest discomfort or lower extremity swelling Gastrointestinal:  Denies nausea, heartburn or change in bowel habits Skin: Denies abnormal skin rashes Lymphatics: Denies new lymphadenopathy or easy bruising Neurological:Denies numbness, tingling or new weaknesses Behavioral/Psych: Mood is stable, no new changes  All other systems were reviewed with the patient and are negative.  PHYSICAL EXAMINATION: ECOG PERFORMANCE STATUS: 0 - Asymptomatic  Filed Vitals:   08/20/13 0838  BP: 150/72  Pulse: 83  Temp: 97.5 F (36.4 C)  Resp: 19   Filed Weights   08/20/13 0838  Weight: 205 lb 8 oz (93.214 kg)    GENERAL:alert, no distress and comfortable SKIN: skin color, texture, turgor are normal, no  rashes or significant lesions EYES: normal, Conjunctiva are pink and non-injected, sclera clear OROPHARYNX:no exudate, no erythema and lips, buccal mucosa, and tongue normal  NECK: supple, thyroid normal size, non-tender, without nodularity LYMPH:  no palpable lymphadenopathy in the cervical, axillary or inguinal LUNGS: clear to auscultation and percussion with normal breathing effort HEART: regular rate & rhythm and no murmurs and no lower extremity  edema ABDOMEN:abdomen soft, non-tender and normal bowel sounds. No palpable splenomegaly Musculoskeletal:no cyanosis of digits and no clubbing  NEURO: alert & oriented x 3 with fluent speech, no focal motor/sensory deficits  LABORATORY DATA:  I have reviewed the data as listed    Component Value Date/Time   NA 139 06/02/2013 0618   NA 137 02/16/2013 0900   K 3.9 06/02/2013 0618   K 3.8 02/16/2013 0900   CL 107 06/02/2013 0618   CL 103 02/16/2013 0900   CO2 25 06/02/2013 0618   CO2 25 02/16/2013 0900   GLUCOSE 107* 06/02/2013 0618   GLUCOSE 396* 02/16/2013 0900   GLUCOSE 292 06/02/2010   BUN 12 06/02/2013 0618   BUN 6.6* 02/16/2013 0900   CREATININE 0.68 06/02/2013 0618   CREATININE 0.9 02/16/2013 0900   CALCIUM 8.7 06/02/2013 0618   CALCIUM 8.8 02/16/2013 0900   PROT 6.8 05/31/2013 1800   PROT 6.2* 02/16/2013 0900   ALBUMIN 3.6 05/31/2013 1800   ALBUMIN 3.1* 02/16/2013 0900   AST 42* 05/31/2013 1800   AST 29 02/16/2013 0900   ALT 22 05/31/2013 1800   ALT 17 02/16/2013 0900   ALKPHOS 128* 05/31/2013 1800   ALKPHOS 118 02/16/2013 0900   BILITOT 0.5 05/31/2013 1800   BILITOT 0.88 02/16/2013 0900   GFRNONAA 85* 06/02/2013 0618   GFRAA >90 06/02/2013 0618    No results found for this basename: SPEP, UPEP,  kappa and lambda light chains    Lab Results  Component Value Date   WBC 8.7 06/20/2013   NEUTROABS 6.1 06/20/2013   HGB 14.3 06/20/2013   HCT 42.7 06/20/2013   MCV 93.8 06/20/2013   PLT 534* 06/20/2013      Chemistry      Component Value Date/Time   NA 139 06/02/2013 0618   NA 137 02/16/2013 0900   K 3.9 06/02/2013 0618   K 3.8 02/16/2013 0900   CL 107 06/02/2013 0618   CL 103 02/16/2013 0900   CO2 25 06/02/2013 0618   CO2 25 02/16/2013 0900   BUN 12 06/02/2013 0618   BUN 6.6* 02/16/2013 0900   CREATININE 0.68 06/02/2013 0618   CREATININE 0.9 02/16/2013 0900      Component Value Date/Time   CALCIUM 8.7 06/02/2013 0618   CALCIUM 8.8 02/16/2013 0900   ALKPHOS 128* 05/31/2013 1800   ALKPHOS 118 02/16/2013 0900   AST 42*  05/31/2013 1800   AST 29 02/16/2013 0900   ALT 22 05/31/2013 1800   ALT 17 02/16/2013 0900   BILITOT 0.5 05/31/2013 1800   BILITOT 0.88 02/16/2013 0900      ASSESSMENT:  #1 secondary erythrocytosis #2 mild thrombocytosis  PLAN:  This is a lady that was diagnosed with myeloproliferative disorder. JAK 2 mutation was negative. She was never evaluated with bone marrow aspirate and biopsy. The patient have received phlebotomy as well as placed on hydroxyurea and aspirin. Today is our first visit together. I recommend she continue on her treatment. Am wondering whether the mild thrombocytosis could be mild iron deficiency given she has received numerous phlebotomy in the past. I  have seen cases with JAK 2 mutation negative myeloproliferative disorder that turn out to have something else. Borderline and she would need a bone marrow aspirate and biopsy in the future. At present time, I will hold further phlebotomy and continue on hydroxyurea. I will review her blood work again next visit along with the ferritin level and to consider but doing a bone marrow aspirate and biopsy in the future.  Orders Placed This Encounter  Procedures  . CBC with Differential    Standing Status: Future     Number of Occurrences:      Standing Expiration Date: 05/12/2014  . Comprehensive metabolic panel    Standing Status: Future     Number of Occurrences:      Standing Expiration Date: 08/20/2014  . Ferritin    Standing Status: Future     Number of Occurrences:      Standing Expiration Date: 08/20/2014   All questions were answered. The patient knows to call the clinic with any problems, questions or concerns. No barriers to learning was detected. I spent 15 minutes counseling the patient face to face. The total time spent in the appointment was 20 minutes and more than 50% was on counseling and review of test results     [facility], [place], MD 08/20/2013 9:21 AM

## 2013-08-21 ENCOUNTER — Encounter: Payer: Self-pay | Admitting: *Deleted

## 2013-08-21 ENCOUNTER — Encounter: Payer: Medicare Other | Attending: Internal Medicine | Admitting: *Deleted

## 2013-08-21 VITALS — Ht 64.5 in | Wt 206.0 lb

## 2013-08-21 DIAGNOSIS — E119 Type 2 diabetes mellitus without complications: Secondary | ICD-10-CM | POA: Insufficient documentation

## 2013-08-21 DIAGNOSIS — Z713 Dietary counseling and surveillance: Secondary | ICD-10-CM | POA: Insufficient documentation

## 2013-08-21 NOTE — Progress Notes (Signed)
Appt start time: 0800 end time:  0930.   Assessment:  Patient was seen on  08/21/13 for individual diabetes education. Patient presents with her husband for educations related to nutrition to better manager her glucose. At the time of diagnosis in 2007 she did not receive any formal education. She has a significant family history of diabetes and death due to complications. In 2014 Amanda [name] was hospitalized two times related to her T2DM.  She has a great deal of stress in her life related to family and illness. Patient does not ever remember having any "Low" events. Dr. Cruzita Lederer wants patient to be between 90-163m/dl. She tests her glucose 2-3X daily. Her FBS this morning was 183. FBS & 2hpp range from 1460mdl to 48977ml.  Current HbA1c: 12.2% in 05/2013  Preferred Learning Style:   Auditory  Visual  Hands on  Learning Readiness:   Ready  Change in progress  MEDICATIONS: See List: Insulin 70/30  DIETARY INTAKE:  Usual eating pattern includes 2 meals and 0-1 snacks per day.  Everyday foods include Eats balance of food groups.  Avoided foods include peanut butter and minimal dairy products.    24-hr recall:  B ( AM): 2 white toast, light cream cheese, hot tea/splenda  Snk ( AM): none  L ( PM): pork garlic sauce, vegetables, 1/2 egg roll, fried rice Snk ( PM): grapes D ( PM): chicken with spaghetti sauce Snk ( PM): egg salade crackers Beverages: regular coke with seltzer water 1-2X daily, water, hot tea,   Usual physical activity: none, very sedentary    Intervention:  Nutrition counseling provided.  Discussed diabetes disease process and treatment options.  Discussed physiology of diabetes and role of obesity on insulin resistance.  Encouraged moderate weight reduction to improve glucose levels.  Discussed role of medications and diet in glucose control  Provided education on macronutrients on glucose levels.  Provided education on carb counting, importance of regularly  scheduled meals/snacks, and meal planning  Discussed effects of physical activity on glucose levels and long-term glucose control.  Recommended 150 minutes of physical activity/week.  Discussed role of stress on blood glucose levels and discussed strategies to manage psychosocial issues.  Discussed recommendations for long-term diabetes self-care.  Established checklist for medical, dental, and emotional self-care.  Plan:  Aim for 3 Carb Choices per meal (45 grams) +/- 1 either way  Aim for 0-1 Carbs per snack if hungry  Consider reading food labels for Total Carbohydrate and Fat Grams of foods Consider  increasing your activity level by walking daily as tolerated to a goal of 150 minutes per week Continue checking BG at alternate times per day as directed by MD   Teaching Method Utilized:  Visual Auditory Hands on  Handouts given during visit include: Living Well with Diabetes Carb Counting and Food Label handouts Meal Plan Card  Barriers to learning/adherence to lifestyle change: Depression  Diabetes self-care support plan:   [facility]  Family  Demonstrated degree of understanding via:  Teach Back   Monitoring/Evaluation:  Dietary intake, exercise, glucose monitor, and body weight return for f/u  in 6 week(s).

## 2013-08-21 NOTE — Patient Instructions (Signed)
Plan:  Aim for 3 Carb Choices per meal (45 grams) +/- 1 either way  Aim for 0-1 Carbs per snack if hungry  Consider reading food labels for Total Carbohydrate and Fat Grams of foods Consider  increasing your activity level by walking daily as tolerated to a goal of 150 minutes per week Continue checking BG at alternate times per day as directed by MD

## 2013-09-10 ENCOUNTER — Ambulatory Visit: Payer: Medicare Other | Admitting: Internal Medicine

## 2013-09-14 ENCOUNTER — Emergency Department (HOSPITAL_BASED_OUTPATIENT_CLINIC_OR_DEPARTMENT_OTHER): Payer: Medicare Other

## 2013-09-14 ENCOUNTER — Encounter (HOSPITAL_BASED_OUTPATIENT_CLINIC_OR_DEPARTMENT_OTHER): Payer: Self-pay | Admitting: Emergency Medicine

## 2013-09-14 ENCOUNTER — Emergency Department (HOSPITAL_BASED_OUTPATIENT_CLINIC_OR_DEPARTMENT_OTHER)
Admission: EM | Admit: 2013-09-14 | Discharge: 2013-09-14 | Disposition: A | Discharge status: Home / Self Care | Payer: Medicare Other | Attending: Emergency Medicine | Admitting: Emergency Medicine

## 2013-09-14 DIAGNOSIS — L0231 Cutaneous abscess of buttock: Secondary | ICD-10-CM | POA: Insufficient documentation

## 2013-09-14 DIAGNOSIS — I10 Essential (primary) hypertension: Secondary | ICD-10-CM | POA: Insufficient documentation

## 2013-09-14 DIAGNOSIS — E669 Obesity, unspecified: Secondary | ICD-10-CM | POA: Insufficient documentation

## 2013-09-14 DIAGNOSIS — Z7982 Long term (current) use of aspirin: Secondary | ICD-10-CM | POA: Insufficient documentation

## 2013-09-14 DIAGNOSIS — Z8619 Personal history of other infectious and parasitic diseases: Secondary | ICD-10-CM | POA: Insufficient documentation

## 2013-09-14 DIAGNOSIS — E785 Hyperlipidemia, unspecified: Secondary | ICD-10-CM | POA: Insufficient documentation

## 2013-09-14 DIAGNOSIS — Z79899 Other long term (current) drug therapy: Secondary | ICD-10-CM | POA: Insufficient documentation

## 2013-09-14 DIAGNOSIS — Z794 Long term (current) use of insulin: Secondary | ICD-10-CM | POA: Insufficient documentation

## 2013-09-14 DIAGNOSIS — Z862 Personal history of diseases of the blood and blood-forming organs and certain disorders involving the immune mechanism: Secondary | ICD-10-CM | POA: Insufficient documentation

## 2013-09-14 DIAGNOSIS — Z792 Long term (current) use of antibiotics: Secondary | ICD-10-CM | POA: Insufficient documentation

## 2013-09-14 DIAGNOSIS — M109 Gout, unspecified: Secondary | ICD-10-CM | POA: Insufficient documentation

## 2013-09-14 DIAGNOSIS — E119 Type 2 diabetes mellitus without complications: Secondary | ICD-10-CM | POA: Insufficient documentation

## 2013-09-14 DIAGNOSIS — L03317 Cellulitis of buttock: Secondary | ICD-10-CM

## 2013-09-14 DIAGNOSIS — Z91040 Latex allergy status: Secondary | ICD-10-CM | POA: Insufficient documentation

## 2013-09-14 DIAGNOSIS — Z8659 Personal history of other mental and behavioral disorders: Secondary | ICD-10-CM | POA: Insufficient documentation

## 2013-09-14 DIAGNOSIS — Z87891 Personal history of nicotine dependence: Secondary | ICD-10-CM | POA: Insufficient documentation

## 2013-09-14 LAB — BASIC METABOLIC PANEL
Chloride: 95 mEq/L — ABNORMAL LOW (ref 96–112)
GFR calc Af Amer: 82 mL/min — ABNORMAL LOW (ref >90)
GFR calc non Af Amer: 71 mL/min — ABNORMAL LOW (ref >90)
Potassium: 4.6 mEq/L (ref 3.5–5.1)

## 2013-09-14 MED ORDER — MORPHINE SULFATE 4 MG/ML IJ SOLN: 4.0000 mg | Freq: Once | INTRAMUSCULAR | Status: AC

## 2013-09-14 MED ORDER — HYDROCODONE-ACETAMINOPHEN 5-325 MG PO TABS: 1.0000 | ORAL_TABLET | ORAL | Status: DC | PRN

## 2013-09-14 MED ORDER — CLINDAMYCIN HCL 150 MG PO CAPS: 450.0000 mg | ORAL_CAPSULE | Freq: Three times a day (TID) | ORAL | Status: DC

## 2013-09-14 MED ORDER — IOHEXOL 300 MG/ML  SOLN
100.0000 mL | Freq: Once | INTRAMUSCULAR | Status: AC | PRN
  Administered 2013-09-14: 100 mL via INTRAVENOUS

## 2013-09-14 MED ORDER — MORPHINE SULFATE 4 MG/ML IJ SOLN
INTRAMUSCULAR | Status: AC
  Administered 2013-09-14: 4 mg via INTRAVENOUS
  Filled 2013-09-14: qty 1

## 2013-09-14 MED ORDER — MORPHINE SULFATE 4 MG/ML IJ SOLN
4.0000 mg | Freq: Once | INTRAMUSCULAR | Status: AC
  Administered 2013-09-14: 4 mg via INTRAVENOUS
  Filled 2013-09-14: qty 1

## 2013-09-14 MED ORDER — CLINDAMYCIN HCL 150 MG PO CAPS
450.0000 mg | ORAL_CAPSULE | Freq: Once | ORAL | Status: AC
  Administered 2013-09-14: 450 mg via ORAL
  Filled 2013-09-14: qty 3

## 2013-09-14 NOTE — ED Notes (Signed)
MD at bedside. 

## 2013-09-14 NOTE — ED Notes (Signed)
Patient transported to CT 

## 2013-09-14 NOTE — ED Notes (Signed)
MD at bedside for I+D

## 2013-09-14 NOTE — ED Notes (Signed)
MD at bedside discussing results and dispo plan of care.

## 2013-09-14 NOTE — ED Provider Notes (Signed)
CSN: [id number]     Arrival date & time 09/14/13  1829 History  This chart was scribed for Osvaldo Shipper, MD by Elby Beck, ED Scribe. This patient was seen in room MH02/MH02 and the patient's care was started at 7:30 PM.   Chief Complaint  Patient presents with  . Abscess    Patient is a 72 y.o. female presenting with abscess. The history is provided by the patient.  Abscess Location:  Ano-genital Ano-genital abscess location:  L buttock Abscess quality: painful and redness   Duration:  3 days Progression:  Worsening Pain details:    Quality:  Sharp   Severity:  Moderate   Duration:  3 days   Timing:  Constant   Progression:  Worsening Chronicity:  New Context: diabetes   Relieved by:  Nothing Worsened by:  Nothing tried Ineffective treatments:  Cold compresses Associated symptoms: no fever, no nausea and no vomiting   Risk factors: prior abscess (to legs)     HPI Comments: Amanda [name] is a 72 y.o. Female with a history of DM who presents to the Emergency Department complaining of a constant, gradually worsening abscess to her left buttock over the past 3 days. She states that she first noticed a "lump" on her left buttock in the shower 3 days ago, and she reports gradually worsening swelling and pain to the area over the past 3 days. She states that she is having "sharp" pain to the abscess today. She states that her pain is worsened with palpation. She states that she has applied cold compresses to the abscess without relief. She states that she has a history of abscesses to her legs only, and that she has been seen at an Infectious Disease clinic in the past. She denies fever, nausea, emesis, urinary symptoms, vaginal bleeding, abdominal pain or any other pain or symptoms.  PCP- Dr. Emi Belfast   Past Medical History  Diagnosis Date  . Hypertension   . Hyperlipidemia   . Diabetes mellitus 2008  . Depression   . Obesity   . Psoriasis   . Polycythemia      Dr. Elease Hashimoto- [facility]  . Herpes simplex   . Gout   . Hyperglycemia 05/31/2013  . 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 HTN (hypertension) 05/31/2013   Past Surgical History  Procedure Laterality Date  . Breast surgery      milk duct  . Total abdominal hysterectomy w/ bilateral salpingoophorectomy      for heavy periods  . Tonsillectomy     Family History  Problem Relation Age of Onset  . Diabetes Mother   . Heart disease Mother     CAD  . Hyperlipidemia Mother   . Hypertension Mother   . Kidney disease Mother   . Kidney disease Father   . Cancer Other     breast   History  Substance Use Topics  . Smoking status: Former Smoker -- 10 years    Quit date: 10/19/1991  . Smokeless tobacco: Never Used  . Alcohol Use: No   OB History   Grav Para Term Preterm Abortions TAB SAB Ect Mult Living                 Review of Systems  Constitutional: Negative for fever.  Gastrointestinal: Negative for nausea, vomiting and abdominal pain.  Genitourinary: Negative for dysuria, urgency, frequency, hematuria and vaginal bleeding.  Skin: Positive for wound (abscess to left buttock).  All other systems reviewed and are negative.  Allergies  Doxycycline and Latex  Home Medications   Current Outpatient Rx  Name  Route  Sig  Dispense  Refill  . aspirin 81 MG EC tablet   Oral   Take 81 mg by mouth daily.           . cholecalciferol (VITAMIN D) 1000 UNITS tablet   Oral   Take 1,000 Units by mouth daily.         . clindamycin (CLEOCIN) 150 MG capsule   Oral   Take 3 capsules (450 mg total) by mouth 3 (three) times daily.   90 capsule   0   . hydrochlorothiazide (HYDRODIURIL) 25 MG tablet      Take one tablet every other day for 3 days   3 tablet   0   . HYDROcodone-acetaminophen (NORCO/VICODIN) 5-325 MG per tablet   Oral   Take 1 tablet by mouth every 4 (four) hours as needed.   10 tablet   0   . hydroxyurea (HYDREA) 500 MG capsule   Oral   Take 500  mg by mouth 2 (two) times daily.          . Insulin Isophane & Regular (HUMULIN 70/30 KWIKPEN) (70-30) 100 UNIT/ML SUPN   Subcutaneous   Inject 120 Units into the skin daily. Inject under skin 50 units before breakfast, 20 units before lunch and 50 units before dinner. Please inject 30 minutes before the meals.   5 pen   0   . Insulin Pen Needle (RA PEN NEEDLES) 31G X 5 MM MISC      Use 3x a day   200 each   3   . INSULIN SYRINGE 1CC/29G (SAFETY-LOK INSULIN SYR 1CC/29G) 29G X 1/2" 1 ML MISC   Does not apply   55 Units by Does not apply route 2 (two) times daily.   100 each   6   . lisinopril (PRINIVIL,ZESTRIL) 40 MG tablet   Oral   Take 1 tablet (40 mg total) by mouth daily.   90 tablet   3   . metoprolol (LOPRESSOR) 100 MG tablet   Oral   Take 100 mg by mouth daily.         . pravastatin (PRAVACHOL) 40 MG tablet   Oral   Take 40 mg by mouth daily.          Triage Vitals: BP 177/63  Pulse 102  Temp(Src) 98.7 F (37.1 C) (Oral)  Resp 20  Ht 5' 4"  (1.626 m)  Wt 196 lb (88.905 kg)  BMI 33.63 kg/m2  SpO2 95%  Physical Exam  Nursing note and vitals reviewed. Constitutional: She is oriented to person, place, and time. She appears well-developed and well-nourished. No distress.  HENT:  Head: Normocephalic and atraumatic.  Eyes: EOM are normal.  Neck: Neck supple. No tracheal deviation present.  Cardiovascular: Normal rate.   Pulmonary/Chest: Effort normal. No respiratory distress.  Genitourinary:  Large, scaling redness to medial left buttock. Redness extends to anus and to left inferior labia. No labial tenderness. No focal labial swelling. No pain or masses felt on rectal exam.  Musculoskeletal: Normal range of motion.  Neurological: She is alert and oriented to person, place, and time.  Skin: Skin is warm and dry.  Psychiatric: She has a normal mood and affect. Her behavior is normal.    ED Course  INCISION AND DRAINAGE Date/Time: 09/14/2013 11:49  PM Performed by: Osvaldo Shipper Authorized by: Osvaldo Shipper Consent: Verbal consent obtained.  Type: abscess Body area: lower extremity Location details: left buttock Anesthesia: local infiltration Local anesthetic: lidocaine 1% with epinephrine Anesthetic total: 6 ml Patient sedated: no Scalpel size: 11 Incision type: single straight Complexity: simple Drainage: purulent Drainage amount: moderate Packing material: 1/4 in iodoform gauze Patient tolerance: Patient tolerated the procedure well with no immediate complications.   (including critical care time)  DIAGNOSTIC STUDIES: Oxygen Saturation is 95% on RA, adequate by my interpretation.    COORDINATION OF CARE: 7:36 PM- Discussed plan to obtain diagnostic lab work and radiology. Will order Morphine and Cleocin while pt is in the ED. Pt advised of plan for treatment and pt agrees.  Medications  morphine 4 MG/ML injection 4 mg (4 mg Intravenous Given 09/14/13 2009)  iohexol (OMNIPAQUE) 300 MG/ML solution 100 mL (100 mLs Intravenous Contrast Given 09/14/13 2051)  clindamycin (CLEOCIN) capsule 450 mg (450 mg Oral Given 09/14/13 2121)  morphine 4 MG/ML injection 4 mg (4 mg Intravenous Given 09/14/13 2221)   Labs Review Labs Reviewed  BASIC METABOLIC PANEL - Abnormal; Notable for the following:    Sodium 132 (*)    Chloride 95 (*)    Glucose, Bld 430 (*)    GFR calc non Af Amer 71 (*)    GFR calc Af Amer 82 (*)    All other components within normal limits   Imaging Review Ct Pelvis W Contrast  09/14/2013   CLINICAL DATA:  Buttock abscess  EXAM: CT PELVIS WITH CONTRAST  TECHNIQUE: Multidetector CT imaging of the pelvis was performed using the standard protocol following the bolus administration of intravenous contrast.  CONTRAST:  146m OMNIPAQUE IOHEXOL 300 MG/ML  SOLN  COMPARISON:  None.  FINDINGS: There is stranding within the subcutaneous fat of the medial left gluteal region. No evidence of abscess. No  subcutaneous emphysema. Stranding extends to the left inguinal region. No obvious fistula can be identified.  Advanced atherosclerotic changes of the common iliac arteries.  Bladder, uterus, and adnexa are unremarkable.  Degenerative changes at L5-S1.  No free-fluid.  No abnormal retroperitoneal adenopathy.  Diverticulosis of the sigmoid colon is present without evidence of acute diverticulitis.  IMPRESSION: There are inflammatory changes in the left medial gluteal subcutaneous fat most consistent with cellulitis. No evidence of abscess. The cellulitis does extend to the left anal region.   Electronically Signed   By: AMaryclare BeanM.D.   On: 09/14/2013 20:57    EKG Interpretation   None       MDM   1. Abscess of left buttock   2. Cellulitis of left buttock    72year old female presents with left buttock abscess. She's had similar previously. On exam she has scaling and cellulitis around this area. The cellulitis extends up to her lower labia. Do not feel anything consistent with a Bartholin's cyst or Bartholin's abscess. It also extends towards her anus. There is no mass on palpation or on digital rectal exam. CT scan shows no perirectal or perianal abscess. It also does not show Bartholin's abscess. Bedside I&D performed. Patient placed on clindamycin. She denies any systemic symptoms, she is stable for discharge.   I personally performed the services described in this documentation, which was scribed in my presence. The recorded information has been reviewed and is accurate.     WOsvaldo Shipper MD 09/14/13 2350

## 2013-09-14 NOTE — ED Notes (Signed)
Left buttock abscess x 3 days

## 2013-09-17 ENCOUNTER — Emergency Department (HOSPITAL_BASED_OUTPATIENT_CLINIC_OR_DEPARTMENT_OTHER)
Admission: EM | Admit: 2013-09-17 | Discharge: 2013-09-17 | Disposition: A | Discharge status: Home / Self Care | Payer: Medicare Other | Attending: Emergency Medicine | Admitting: Emergency Medicine

## 2013-09-17 ENCOUNTER — Encounter (HOSPITAL_BASED_OUTPATIENT_CLINIC_OR_DEPARTMENT_OTHER): Payer: Self-pay | Admitting: Emergency Medicine

## 2013-09-17 DIAGNOSIS — Z872 Personal history of diseases of the skin and subcutaneous tissue: Secondary | ICD-10-CM | POA: Insufficient documentation

## 2013-09-17 DIAGNOSIS — Z8659 Personal history of other mental and behavioral disorders: Secondary | ICD-10-CM | POA: Insufficient documentation

## 2013-09-17 DIAGNOSIS — E785 Hyperlipidemia, unspecified: Secondary | ICD-10-CM | POA: Insufficient documentation

## 2013-09-17 DIAGNOSIS — Z8619 Personal history of other infectious and parasitic diseases: Secondary | ICD-10-CM | POA: Insufficient documentation

## 2013-09-17 DIAGNOSIS — E669 Obesity, unspecified: Secondary | ICD-10-CM | POA: Insufficient documentation

## 2013-09-17 DIAGNOSIS — Z79899 Other long term (current) drug therapy: Secondary | ICD-10-CM | POA: Insufficient documentation

## 2013-09-17 DIAGNOSIS — Z794 Long term (current) use of insulin: Secondary | ICD-10-CM | POA: Insufficient documentation

## 2013-09-17 DIAGNOSIS — I10 Essential (primary) hypertension: Secondary | ICD-10-CM | POA: Insufficient documentation

## 2013-09-17 DIAGNOSIS — Z8639 Personal history of other endocrine, nutritional and metabolic disease: Secondary | ICD-10-CM | POA: Insufficient documentation

## 2013-09-17 DIAGNOSIS — Z5189 Encounter for other specified aftercare: Secondary | ICD-10-CM

## 2013-09-17 DIAGNOSIS — Z862 Personal history of diseases of the blood and blood-forming organs and certain disorders involving the immune mechanism: Secondary | ICD-10-CM | POA: Insufficient documentation

## 2013-09-17 DIAGNOSIS — Z87891 Personal history of nicotine dependence: Secondary | ICD-10-CM | POA: Insufficient documentation

## 2013-09-17 DIAGNOSIS — E119 Type 2 diabetes mellitus without complications: Secondary | ICD-10-CM | POA: Insufficient documentation

## 2013-09-17 DIAGNOSIS — Z91040 Latex allergy status: Secondary | ICD-10-CM | POA: Insufficient documentation

## 2013-09-17 DIAGNOSIS — Z7982 Long term (current) use of aspirin: Secondary | ICD-10-CM | POA: Insufficient documentation

## 2013-09-17 DIAGNOSIS — Z4801 Encounter for change or removal of surgical wound dressing: Secondary | ICD-10-CM | POA: Insufficient documentation

## 2013-09-17 NOTE — ED Provider Notes (Signed)
CSN: [id number]     Arrival date & time 09/17/13  4034 History   First MD Initiated Contact with Patient 09/17/13 [phone]     Chief Complaint  Patient presents with  . Follow-up   (Consider location/radiation/quality/duration/timing/severity/associated sxs/prior Treatment) Patient is a 72 y.o. female presenting with wound check. The history is provided by the patient.  Wound Check This is a new problem. Episode onset: 3 days ago had I&D. The problem occurs constantly. The problem has been gradually improving. Associated symptoms comments: Here for abscess packing removal. Exacerbated by: sitting. The symptoms are relieved by medications. Treatments tried: i&D. The treatment provided significant relief.    Past Medical History  Diagnosis Date  . Hypertension   . Hyperlipidemia   . Diabetes mellitus 2008  . Depression   . Obesity   . Psoriasis   . Polycythemia     Dr. Elease Hashimoto- [facility]  . Herpes simplex   . Gout   . Hyperglycemia 05/31/2013  . 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 HTN (hypertension) 05/31/2013   Past Surgical History  Procedure Laterality Date  . Breast surgery      milk duct  . Total abdominal hysterectomy w/ bilateral salpingoophorectomy      for heavy periods  . Tonsillectomy     Family History  Problem Relation Age of Onset  . Diabetes Mother   . Heart disease Mother     CAD  . Hyperlipidemia Mother   . Hypertension Mother   . Kidney disease Mother   . Kidney disease Father   . Cancer Other     breast   History  Substance Use Topics  . Smoking status: Former Smoker -- 15 years    Quit date: 10/19/1991  . Smokeless tobacco: Never Used  . Alcohol Use: No   OB History   Grav Para Term Preterm Abortions TAB SAB Ect Mult Living                 Review of Systems  Constitutional: Negative for fever.  All other systems reviewed and are negative.    Allergies  Doxycycline and Latex  Home Medications   Current Outpatient Rx  Name   Route  Sig  Dispense  Refill  . aspirin 81 MG EC tablet   Oral   Take 81 mg by mouth daily.           . cholecalciferol (VITAMIN D) 1000 UNITS tablet   Oral   Take 1,000 Units by mouth daily.         . clindamycin (CLEOCIN) 150 MG capsule   Oral   Take 3 capsules (450 mg total) by mouth 3 (three) times daily.   90 capsule   0   . hydrochlorothiazide (HYDRODIURIL) 25 MG tablet      Take one tablet every other day for 3 days   3 tablet   0   . HYDROcodone-acetaminophen (NORCO/VICODIN) 5-325 MG per tablet   Oral   Take 1 tablet by mouth every 4 (four) hours as needed.   10 tablet   0   . hydroxyurea (HYDREA) 500 MG capsule   Oral   Take 500 mg by mouth 2 (two) times daily.          . Insulin Isophane & Regular (HUMULIN 70/30 KWIKPEN) (70-30) 100 UNIT/ML SUPN   Subcutaneous   Inject 120 Units into the skin daily. Inject under skin 50 units before breakfast, 20 units before lunch and 50 units before dinner. Please  inject 30 minutes before the meals.   5 pen   0   . Insulin Pen Needle (RA PEN NEEDLES) 31G X 5 MM MISC      Use 3x a day   200 each   3   . INSULIN SYRINGE 1CC/29G (SAFETY-LOK INSULIN SYR 1CC/29G) 29G X 1/2" 1 ML MISC   Does not apply   55 Units by Does not apply route 2 (two) times daily.   100 each   6   . lisinopril (PRINIVIL,ZESTRIL) 40 MG tablet   Oral   Take 1 tablet (40 mg total) by mouth daily.   90 tablet   3   . metoprolol (LOPRESSOR) 100 MG tablet   Oral   Take 100 mg by mouth daily.         . pravastatin (PRAVACHOL) 40 MG tablet   Oral   Take 40 mg by mouth daily.          BP 175/82  Pulse 102  Temp(Src) 98.5 F (36.9 C) (Oral)  Resp 16  SpO2 96% Physical Exam  Nursing note and vitals reviewed. Constitutional: She appears well-developed and well-nourished. No distress.  HENT:  Head: Normocephalic and atraumatic.  Eyes: EOM are normal. Pupils are equal, round, and reactive to light.  Cardiovascular: Normal rate.     Pulmonary/Chest: Effort normal.  Skin: Skin is warm and dry. There is erythema.     Psychiatric: She has a normal mood and affect. Her behavior is normal.    ED Course  Procedures (including critical care time) Labs Review Labs Reviewed - No data to display Imaging Review No results found.  EKG Interpretation   None       MDM   1. Wound check, abscess     Pt presents for packing removal.  Abscess is draining and still has surrounding erythema but only minimal induration.  Pt states it is improving and still taking abx.  Packing removed and given abscess aftercare instructions.    Blanchie Dessert, MD 09/17/13 [phone]

## 2013-09-17 NOTE — ED Notes (Signed)
Pt reports abscess to left buttocks drained 3 days ago, needs packing removed.

## 2013-10-02 ENCOUNTER — Ambulatory Visit: Payer: Medicare Other | Admitting: *Deleted

## 2013-10-08 ENCOUNTER — Ambulatory Visit: Payer: Medicare Other | Admitting: Internal Medicine

## 2013-10-19 ENCOUNTER — Encounter: Payer: Self-pay | Admitting: *Deleted

## 2013-10-19 ENCOUNTER — Ambulatory Visit: Payer: Medicare Other | Admitting: Hematology and Oncology

## 2013-10-23 ENCOUNTER — Emergency Department (HOSPITAL_BASED_OUTPATIENT_CLINIC_OR_DEPARTMENT_OTHER)
Admission: EM | Admit: 2013-10-23 | Discharge: 2013-10-23 | Disposition: A | Discharge status: Home / Self Care | Payer: Medicare Other

## 2013-10-23 ENCOUNTER — Encounter (HOSPITAL_BASED_OUTPATIENT_CLINIC_OR_DEPARTMENT_OTHER): Payer: Self-pay | Admitting: Emergency Medicine

## 2013-10-23 DIAGNOSIS — L0231 Cutaneous abscess of buttock: Secondary | ICD-10-CM

## 2013-10-23 NOTE — ED Notes (Signed)
Abscess to her buttocks 3 weeks ago. She had an I&D. Abscess is back.

## 2013-10-23 NOTE — ED Notes (Signed)
Pt ambulated out of room yelling "I'm leaving, I will not wait another minute". Pt continued out of department ambulated with a steady gait and refused to sign out AMA.

## 2013-10-23 NOTE — ED Notes (Signed)
Pt ambulatory to exam room pt given gown asked her to change into. Pt asks for a glass of ice water. Told pt we needed to wait until the physician evaluates her. She states "I know what you say but I been waiting all this time and I want some water" she states " I was here 3 weeks ago I am back today I already know what is wrong " advised pt that will give her the water as requested as long as she understands if it prohibits a test etc. Pt states " well never mind I will wait" pt given warm blanket . Family member is at bedside

## 2013-10-23 NOTE — ED Notes (Signed)
Called from triage to lobby with Ivin Booty, RN to find patient on the floor. Undetermined how patient got on the floor, but she did not fall. She stated "I want to get up and I want to go home". Patient offered a wheelchair. She refused it, stating "thats the reason I'm here!" Patient again offered a wheelchair to get to the back. Hallway bed offered by Ivin Booty, RN. Patient again refused wheelchair transport to bed. Patient ambulates without assistance to treatment room.

## 2013-10-23 NOTE — ED Notes (Signed)
Patient family member out to nurses station to ask about when the doctor will be in to see the patient. Patient is asking about water and wait time. Patient advised that the MDs are moving as fast as they can, and she should not have anything by mouth until she assessed. Patient ambulatory to restroom

## 2013-10-24 ENCOUNTER — Emergency Department (HOSPITAL_BASED_OUTPATIENT_CLINIC_OR_DEPARTMENT_OTHER)
Admission: EM | Admit: 2013-10-24 | Discharge: 2013-10-24 | Disposition: A | Discharge status: Home / Self Care | Payer: Medicare HMO | Attending: Emergency Medicine | Admitting: Emergency Medicine

## 2013-10-24 ENCOUNTER — Telehealth: Payer: Self-pay | Admitting: *Deleted

## 2013-10-24 ENCOUNTER — Encounter (HOSPITAL_BASED_OUTPATIENT_CLINIC_OR_DEPARTMENT_OTHER): Payer: Self-pay | Admitting: Emergency Medicine

## 2013-10-24 DIAGNOSIS — Z91040 Latex allergy status: Secondary | ICD-10-CM | POA: Insufficient documentation

## 2013-10-24 DIAGNOSIS — E669 Obesity, unspecified: Secondary | ICD-10-CM | POA: Insufficient documentation

## 2013-10-24 DIAGNOSIS — E119 Type 2 diabetes mellitus without complications: Secondary | ICD-10-CM | POA: Insufficient documentation

## 2013-10-24 DIAGNOSIS — Z8659 Personal history of other mental and behavioral disorders: Secondary | ICD-10-CM | POA: Insufficient documentation

## 2013-10-24 DIAGNOSIS — R110 Nausea: Secondary | ICD-10-CM | POA: Insufficient documentation

## 2013-10-24 DIAGNOSIS — Z862 Personal history of diseases of the blood and blood-forming organs and certain disorders involving the immune mechanism: Secondary | ICD-10-CM | POA: Insufficient documentation

## 2013-10-24 DIAGNOSIS — E785 Hyperlipidemia, unspecified: Secondary | ICD-10-CM | POA: Insufficient documentation

## 2013-10-24 DIAGNOSIS — Z794 Long term (current) use of insulin: Secondary | ICD-10-CM | POA: Insufficient documentation

## 2013-10-24 DIAGNOSIS — Z79899 Other long term (current) drug therapy: Secondary | ICD-10-CM | POA: Insufficient documentation

## 2013-10-24 DIAGNOSIS — L03317 Cellulitis of buttock: Principal | ICD-10-CM

## 2013-10-24 DIAGNOSIS — Z8619 Personal history of other infectious and parasitic diseases: Secondary | ICD-10-CM | POA: Insufficient documentation

## 2013-10-24 DIAGNOSIS — L0231 Cutaneous abscess of buttock: Secondary | ICD-10-CM | POA: Insufficient documentation

## 2013-10-24 DIAGNOSIS — I10 Essential (primary) hypertension: Secondary | ICD-10-CM | POA: Insufficient documentation

## 2013-10-24 DIAGNOSIS — Z7982 Long term (current) use of aspirin: Secondary | ICD-10-CM | POA: Insufficient documentation

## 2013-10-24 DIAGNOSIS — Z87891 Personal history of nicotine dependence: Secondary | ICD-10-CM | POA: Insufficient documentation

## 2013-10-24 MED ORDER — DEXTROSE 5 % IV SOLN
1.0000 g | INTRAVENOUS | Status: DC
  Administered 2013-10-24: 1 g via INTRAVENOUS

## 2013-10-24 MED ORDER — ONDANSETRON HCL 4 MG/2ML IJ SOLN
4.0000 mg | Freq: Once | INTRAMUSCULAR | Status: AC
  Administered 2013-10-24: 4 mg via INTRAMUSCULAR
  Filled 2013-10-24: qty 2

## 2013-10-24 MED ORDER — SULFAMETHOXAZOLE-TRIMETHOPRIM 800-160 MG PO TABS
1.0000 | ORAL_TABLET | Freq: Two times a day (BID) | ORAL | Status: DC
Start: 2013-10-24 — End: 2013-10-26

## 2013-10-24 MED ORDER — HYDROMORPHONE HCL PF 1 MG/ML IJ SOLN
1.0000 mg | Freq: Once | INTRAMUSCULAR | Status: AC
  Administered 2013-10-24: 1 mg via INTRAVENOUS
  Filled 2013-10-24: qty 1

## 2013-10-24 MED ORDER — CEFTRIAXONE SODIUM 1 G IJ SOLR
INTRAMUSCULAR | Status: AC
  Filled 2013-10-24: qty 10

## 2013-10-24 MED ORDER — HYDROCODONE-ACETAMINOPHEN 5-325 MG PO TABS: 1.0000 | ORAL_TABLET | ORAL | Status: DC | PRN

## 2013-10-24 NOTE — ED Notes (Signed)
Red, painful area noted on left buttock that appears to be draining serosanguinous drainage.

## 2013-10-24 NOTE — Telephone Encounter (Signed)
Pt with a recurrent abscess, was seen in ED for I&D advised pt to be seen again in ED for further treatment

## 2013-10-24 NOTE — ED Notes (Signed)
Boil noted on buttocks.  Pt was seen in ED yesterday but left prior to being seen by EDP.

## 2013-10-24 NOTE — ED Provider Notes (Cosign Needed)
CSN: [id number]     Arrival date & time 10/23/13  1316 History   First MD Initiated Contact with Patient 10/23/13 1654     Chief Complaint  Patient presents with  . Abscess   (Consider location/radiation/quality/duration/timing/severity/associated sxs/prior Treatment) HPI  Past Medical History  Diagnosis Date  . Hypertension   . Hyperlipidemia   . Diabetes mellitus 2008  . Depression   . Obesity   . Psoriasis   . Polycythemia     Dr. Elease Hashimoto- [facility]  . Herpes simplex   . Gout   . Hyperglycemia 05/31/2013  . 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 HTN (hypertension) 05/31/2013   Past Surgical History  Procedure Laterality Date  . Breast surgery      milk duct  . Total abdominal hysterectomy w/ bilateral salpingoophorectomy      for heavy periods  . Tonsillectomy     Family History  Problem Relation Age of Onset  . Diabetes Mother   . Heart disease Mother     CAD  . Hyperlipidemia Mother   . Hypertension Mother   . Kidney disease Mother   . Kidney disease Father   . Cancer Other     breast   History  Substance Use Topics  . Smoking status: Former Smoker -- 79 years    Quit date: 10/19/1991  . Smokeless tobacco: Never Used  . Alcohol Use: No   OB History   Grav Para Term Preterm Abortions TAB SAB Ect Mult Living                 Review of Systems  Allergies  Doxycycline and Latex  Home Medications   Current Outpatient Rx  Name  Route  Sig  Dispense  Refill  . aspirin 81 MG EC tablet   Oral   Take 81 mg by mouth daily.           . cholecalciferol (VITAMIN D) 1000 UNITS tablet   Oral   Take 1,000 Units by mouth daily.         . clindamycin (CLEOCIN) 150 MG capsule   Oral   Take 3 capsules (450 mg total) by mouth 3 (three) times daily.   90 capsule   0   . hydrochlorothiazide (HYDRODIURIL) 25 MG tablet      Take one tablet every other day for 3 days   3 tablet   0   . HYDROcodone-acetaminophen (NORCO/VICODIN) 5-325 MG per tablet   Oral   Take 1 tablet by mouth every 4 (four) hours as needed.   10 tablet   0   . hydroxyurea (HYDREA) 500 MG capsule   Oral   Take 500 mg by mouth 2 (two) times daily.          . Insulin Isophane & Regular (HUMULIN 70/30 KWIKPEN) (70-30) 100 UNIT/ML SUPN   Subcutaneous   Inject 120 Units into the skin daily. Inject under skin 50 units before breakfast, 20 units before lunch and 50 units before dinner. Please inject 30 minutes before the meals.   5 pen   0   . Insulin Pen Needle (RA PEN NEEDLES) 31G X 5 MM MISC      Use 3x a day   200 each   3   . INSULIN SYRINGE 1CC/29G (SAFETY-LOK INSULIN SYR 1CC/29G) 29G X 1/2" 1 ML MISC   Does not apply   55 Units by Does not apply route 2 (two) times daily.   100 each   6   .  lisinopril (PRINIVIL,ZESTRIL) 40 MG tablet   Oral   Take 1 tablet (40 mg total) by mouth daily.   90 tablet   3   . metoprolol (LOPRESSOR) 100 MG tablet   Oral   Take 100 mg by mouth daily.         . pravastatin (PRAVACHOL) 40 MG tablet   Oral   Take 40 mg by mouth daily.         Marland Kitchen sulfamethoxazole-trimethoprim (BACTRIM DS,SEPTRA DS) 800-160 MG per tablet   Oral   Take 1 tablet by mouth 2 (two) times daily.   14 tablet   0    BP 170/73  Pulse 108  Temp(Src) 97.9 F (36.6 C) (Oral)  Resp 24  Ht 5' 4"  (1.626 m)  Wt 196 lb (88.905 kg)  BMI 33.63 kg/m2  SpO2 98% Physical Exam  ED Course  Procedures (including critical care time) Labs Review Labs Reviewed - No data to display Imaging Review No results found.  EKG Interpretation   None       MDM   1. Abscess of left buttock        Fransico Meadow, [place] 10/24/13 1948

## 2013-10-24 NOTE — ED Provider Notes (Signed)
CSN: [id number]     Arrival date & time 10/24/13  1251 History   First MD Initiated Contact with Patient 10/24/13 1359     Chief Complaint  Patient presents with  . Recurrent Skin Infections   (Consider location/radiation/quality/duration/timing/severity/associated sxs/prior Treatment) Patient is a 73 y.o. female presenting with abscess. The history is provided by the patient. No language interpreter was used.  Abscess Location:  Ano-genital Ano-genital abscess location:  L buttock Size:  3 Abscess quality: draining, induration, redness and warmth   Red streaking: yes   Progression:  Worsening Chronicity:  New Relieved by:  Nothing Worsened by:  Nothing tried Ineffective treatments:  None tried Associated symptoms: nausea   Pt had a previous I and D on the same area.   Pt reports increased swelling and redness  Past Medical History  Diagnosis Date  . Hypertension   . Hyperlipidemia   . Diabetes mellitus 2008  . Depression   . Obesity   . Psoriasis   . Polycythemia     Dr. Elease Hashimoto- [facility]  . Herpes simplex   . Gout   . Hyperglycemia 05/31/2013  . 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 HTN (hypertension) 05/31/2013   Past Surgical History  Procedure Laterality Date  . Breast surgery      milk duct  . Total abdominal hysterectomy w/ bilateral salpingoophorectomy      for heavy periods  . Tonsillectomy     Family History  Problem Relation Age of Onset  . Diabetes Mother   . Heart disease Mother     CAD  . Hyperlipidemia Mother   . Hypertension Mother   . Kidney disease Mother   . Kidney disease Father   . Cancer Other     breast   History  Substance Use Topics  . Smoking status: Former Smoker -- 21 years    Quit date: 10/19/1991  . Smokeless tobacco: Never Used  . Alcohol Use: No   OB History   Grav Para Term Preterm Abortions TAB SAB Ect Mult Living                 Review of Systems  Gastrointestinal: Positive for nausea.  Skin: Positive for  wound.  All other systems reviewed and are negative.    Allergies  Doxycycline and Latex  Home Medications   Current Outpatient Rx  Name  Route  Sig  Dispense  Refill  . aspirin 81 MG EC tablet   Oral   Take 81 mg by mouth daily.           . cholecalciferol (VITAMIN D) 1000 UNITS tablet   Oral   Take 1,000 Units by mouth daily.         . clindamycin (CLEOCIN) 150 MG capsule   Oral   Take 3 capsules (450 mg total) by mouth 3 (three) times daily.   90 capsule   0   . hydrochlorothiazide (HYDRODIURIL) 25 MG tablet      Take one tablet every other day for 3 days   3 tablet   0   . HYDROcodone-acetaminophen (NORCO/VICODIN) 5-325 MG per tablet   Oral   Take 1 tablet by mouth every 4 (four) hours as needed.   10 tablet   0   . hydroxyurea (HYDREA) 500 MG capsule   Oral   Take 500 mg by mouth 2 (two) times daily.          . Insulin Isophane & Regular (HUMULIN 70/30 KWIKPEN) (70-30) 100  UNIT/ML SUPN   Subcutaneous   Inject 120 Units into the skin daily. Inject under skin 50 units before breakfast, 20 units before lunch and 50 units before dinner. Please inject 30 minutes before the meals.   5 pen   0   . Insulin Pen Needle (RA PEN NEEDLES) 31G X 5 MM MISC      Use 3x a day   200 each   3   . INSULIN SYRINGE 1CC/29G (SAFETY-LOK INSULIN SYR 1CC/29G) 29G X 1/2" 1 ML MISC   Does not apply   55 Units by Does not apply route 2 (two) times daily.   100 each   6   . lisinopril (PRINIVIL,ZESTRIL) 40 MG tablet   Oral   Take 1 tablet (40 mg total) by mouth daily.   90 tablet   3   . metoprolol (LOPRESSOR) 100 MG tablet   Oral   Take 100 mg by mouth daily.         . pravastatin (PRAVACHOL) 40 MG tablet   Oral   Take 40 mg by mouth daily.          BP 165/56  Pulse 106  Temp(Src) 98.2 F (36.8 C) (Oral)  Resp 20  Ht 5' 4"  (1.626 m)  Wt 196 lb (88.905 kg)  BMI 33.63 kg/m2  SpO2 100% Physical Exam  Vitals reviewed. Constitutional: She is oriented  to person, place, and time. She appears well-developed and well-nourished.  HENT:  Head: Normocephalic and atraumatic.  Cardiovascular: Normal rate.   Pulmonary/Chest: Effort normal.  Abdominal: Soft.  Musculoskeletal: Normal range of motion.  Neurological: She is alert and oriented to person, place, and time. She has normal reflexes.  Skin: Skin is warm. There is erythema.  Psychiatric: She has a normal mood and affect.    ED Course  INCISION AND DRAINAGE Date/Time: 10/24/2013 4:24 PM Performed by: Fransico Meadow Authorized by: Fransico Meadow Consent: Verbal consent obtained. Consent given by: patient Patient understanding: patient states understanding of the procedure being performed Patient identity confirmed: verbally with patient Type: abscess Anesthesia: local infiltration Local anesthetic: lidocaine 2% without epinephrine Scalpel size: 11 Incision type: single straight Complexity: simple Drainage: purulent Drainage amount: moderate Wound treatment: wound left open Packing material: 1/2 in gauze Patient tolerance: Patient tolerated the procedure well with no immediate complications.   (including critical care time) Labs Review Labs Reviewed - No data to display Imaging Review No results found.  EKG Interpretation   None       MDM Pt given IV rocephin here   1. Abscess of buttock, left       Fransico Meadow, [place] 10/24/13 1629

## 2013-10-24 NOTE — Discharge Instructions (Signed)
Abscess An abscess is an infected area that contains a collection of pus and debris.It can occur in almost any part of the body. An abscess is also known as a furuncle or boil. CAUSES  An abscess occurs when tissue gets infected. This can occur from blockage of oil or sweat glands, infection of hair follicles, or a minor injury to the skin. As the body tries to fight the infection, pus collects in the area and creates pressure under the skin. This pressure causes pain. People with weakened immune systems have difficulty fighting infections and get certain abscesses more often.  SYMPTOMS Usually an abscess develops on the skin and becomes a painful mass that is red, warm, and tender. If the abscess forms under the skin, you may feel a moveable soft area under the skin. Some abscesses break open (rupture) on their own, but most will continue to get worse without care. The infection can spread deeper into the body and eventually into the bloodstream, causing you to feel ill.  DIAGNOSIS  Your caregiver will take your medical history and perform a physical exam. A sample of fluid may also be taken from the abscess to determine what is causing your infection. TREATMENT  Your caregiver may prescribe antibiotic medicines to fight the infection. However, taking antibiotics alone usually does not cure an abscess. Your caregiver may need to make a small cut (incision) in the abscess to drain the pus. In some cases, gauze is packed into the abscess to reduce pain and to continue draining the area. HOME CARE INSTRUCTIONS   Only take over-the-counter or prescription medicines for pain, discomfort, or fever as directed by your caregiver.  If you were prescribed antibiotics, take them as directed. Finish them even if you start to feel better.  If gauze is used, follow your caregiver's directions for changing the gauze.  To avoid spreading the infection:  Keep your draining abscess covered with a  bandage.  Wash your hands well.  Do not share personal care items, towels, or whirlpools with others.  Avoid skin contact with others.  Keep your skin and clothes clean around the abscess.  Keep all follow-up appointments as directed by your caregiver. SEEK MEDICAL CARE IF:   You have increased pain, swelling, redness, fluid drainage, or bleeding.  You have muscle aches, chills, or a general ill feeling.  You have a fever. MAKE SURE YOU:   Understand these instructions.  Will watch your condition.  Will get help right away if you are not doing well or get worse. Document Released: 07/14/2005 Document Revised: 04/04/2012 Document Reviewed: 12/17/2011 [employer] Patient Information 2014 [employer].  

## 2013-10-24 NOTE — ED Provider Notes (Signed)
Medical screening examination/treatment/procedure(s) were performed by non-physician practitioner and as supervising physician I was immediately available for consultation/collaboration.  EKG Interpretation   None         Blanchie Dessert, MD 10/24/13 2323

## 2013-10-26 ENCOUNTER — Emergency Department (HOSPITAL_BASED_OUTPATIENT_CLINIC_OR_DEPARTMENT_OTHER)
Admission: EM | Admit: 2013-10-26 | Discharge: 2013-10-26 | Disposition: A | Discharge status: Home / Self Care | Payer: Medicare HMO | Attending: Emergency Medicine | Admitting: Emergency Medicine

## 2013-10-26 ENCOUNTER — Encounter (HOSPITAL_BASED_OUTPATIENT_CLINIC_OR_DEPARTMENT_OTHER): Payer: Self-pay | Admitting: Emergency Medicine

## 2013-10-26 DIAGNOSIS — Z7982 Long term (current) use of aspirin: Secondary | ICD-10-CM | POA: Insufficient documentation

## 2013-10-26 DIAGNOSIS — Z87891 Personal history of nicotine dependence: Secondary | ICD-10-CM | POA: Insufficient documentation

## 2013-10-26 DIAGNOSIS — Z87898 Personal history of other specified conditions: Secondary | ICD-10-CM | POA: Insufficient documentation

## 2013-10-26 DIAGNOSIS — Z5189 Encounter for other specified aftercare: Secondary | ICD-10-CM

## 2013-10-26 DIAGNOSIS — Z91040 Latex allergy status: Secondary | ICD-10-CM | POA: Insufficient documentation

## 2013-10-26 DIAGNOSIS — Z794 Long term (current) use of insulin: Secondary | ICD-10-CM | POA: Insufficient documentation

## 2013-10-26 DIAGNOSIS — Z8619 Personal history of other infectious and parasitic diseases: Secondary | ICD-10-CM | POA: Insufficient documentation

## 2013-10-26 DIAGNOSIS — F329 Major depressive disorder, single episode, unspecified: Secondary | ICD-10-CM | POA: Insufficient documentation

## 2013-10-26 DIAGNOSIS — Z79899 Other long term (current) drug therapy: Secondary | ICD-10-CM | POA: Insufficient documentation

## 2013-10-26 DIAGNOSIS — F3289 Other specified depressive episodes: Secondary | ICD-10-CM | POA: Insufficient documentation

## 2013-10-26 DIAGNOSIS — I10 Essential (primary) hypertension: Secondary | ICD-10-CM | POA: Insufficient documentation

## 2013-10-26 DIAGNOSIS — Z4801 Encounter for change or removal of surgical wound dressing: Secondary | ICD-10-CM | POA: Insufficient documentation

## 2013-10-26 DIAGNOSIS — E785 Hyperlipidemia, unspecified: Secondary | ICD-10-CM | POA: Insufficient documentation

## 2013-10-26 DIAGNOSIS — E119 Type 2 diabetes mellitus without complications: Secondary | ICD-10-CM | POA: Insufficient documentation

## 2013-10-26 DIAGNOSIS — Z872 Personal history of diseases of the skin and subcutaneous tissue: Secondary | ICD-10-CM | POA: Insufficient documentation

## 2013-10-26 DIAGNOSIS — Z792 Long term (current) use of antibiotics: Secondary | ICD-10-CM | POA: Insufficient documentation

## 2013-10-26 DIAGNOSIS — E669 Obesity, unspecified: Secondary | ICD-10-CM | POA: Insufficient documentation

## 2013-10-26 MED ORDER — HYDROMORPHONE HCL PF 1 MG/ML IJ SOLN
1.0000 mg | Freq: Once | INTRAMUSCULAR | Status: AC
  Administered 2013-10-26: 1 mg via INTRAMUSCULAR
  Filled 2013-10-26: qty 1

## 2013-10-26 MED ORDER — ONDANSETRON 4 MG PO TBDP
4.0000 mg | ORAL_TABLET | Freq: Once | ORAL | Status: AC
  Administered 2013-10-26: 4 mg via ORAL
  Filled 2013-10-26: qty 1

## 2013-10-26 MED ORDER — CLINDAMYCIN HCL 150 MG PO CAPS: 300.0000 mg | ORAL_CAPSULE | Freq: Three times a day (TID) | ORAL | Status: DC

## 2013-10-26 MED ORDER — ONDANSETRON 4 MG PO TBDP: 4.0000 mg | ORAL_TABLET | Freq: Three times a day (TID) | ORAL | Status: DC | PRN

## 2013-10-26 NOTE — ED Notes (Signed)
Recheck from I&D on buttock 2 days ago. Pt reports site bleeding.

## 2013-10-26 NOTE — Discharge Instructions (Signed)
Take Clindamycin as directed until gone. Take zofran as needed for nausea. Follow up with General Surgery. Return to the ED with worsening or concerning symptoms.

## 2013-10-26 NOTE — ED Provider Notes (Signed)
CSN: [id number]     Arrival date & time 10/26/13  1216 History   First MD Initiated Contact with Patient 10/26/13 1223     Chief Complaint  Patient presents with  . Follow-up   (Consider location/radiation/quality/duration/timing/severity/associated sxs/prior Treatment) HPI Comments: Patient is a 73 year old female who presents for a follow up visit for packing removal from incision and drainage from 2 days ago. Patient reports subjective healing. Patient reports not taking her antibiotics because it makes her sick. Patient reports pain at the site that is improving. The pain is throbbing without radiation. Patient denies any other symptoms such as fever. Patient reports she will follow up with General Surgery.   Past Medical History  Diagnosis Date  . Hypertension   . Hyperlipidemia   . Diabetes mellitus 2008  . Depression   . Obesity   . Psoriasis   . Polycythemia     Dr. Elease Hashimoto- [facility]  . Herpes simplex   . Gout   . Hyperglycemia 05/31/2013  . 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 HTN (hypertension) 05/31/2013   Past Surgical History  Procedure Laterality Date  . Breast surgery      milk duct  . Total abdominal hysterectomy w/ bilateral salpingoophorectomy      for heavy periods  . Tonsillectomy     Family History  Problem Relation Age of Onset  . Diabetes Mother   . Heart disease Mother     CAD  . Hyperlipidemia Mother   . Hypertension Mother   . Kidney disease Mother   . Kidney disease Father   . Cancer Other     breast   History  Substance Use Topics  . Smoking status: Former Smoker -- 61 years    Quit date: 10/19/1991  . Smokeless tobacco: Never Used  . Alcohol Use: No   OB History   Grav Para Term Preterm Abortions TAB SAB Ect Mult Living                 Review of Systems  Constitutional: Negative for fever, chills and fatigue.  HENT: Negative for trouble swallowing.   Eyes: Negative for visual disturbance.  Respiratory: Negative for  shortness of breath.   Cardiovascular: Negative for chest pain and palpitations.  Gastrointestinal: Negative for nausea, vomiting, abdominal pain and diarrhea.  Genitourinary: Negative for dysuria and difficulty urinating.  Musculoskeletal: Negative for arthralgias and neck pain.  Skin: Positive for wound. Negative for color change.  Neurological: Negative for dizziness and weakness.  Psychiatric/Behavioral: Negative for dysphoric mood.    Allergies  Doxycycline and Latex  Home Medications   Current Outpatient Rx  Name  Route  Sig  Dispense  Refill  . aspirin 81 MG EC tablet   Oral   Take 81 mg by mouth daily.           . cholecalciferol (VITAMIN D) 1000 UNITS tablet   Oral   Take 1,000 Units by mouth daily.         . clindamycin (CLEOCIN) 150 MG capsule   Oral   Take 3 capsules (450 mg total) by mouth 3 (three) times daily.   90 capsule   0   . hydrochlorothiazide (HYDRODIURIL) 25 MG tablet      Take one tablet every other day for 3 days   3 tablet   0   . HYDROcodone-acetaminophen (NORCO/VICODIN) 5-325 MG per tablet   Oral   Take 1 tablet by mouth every 4 (four) hours as needed.  10 tablet   0   . hydroxyurea (HYDREA) 500 MG capsule   Oral   Take 500 mg by mouth 2 (two) times daily.          . Insulin Isophane & Regular (HUMULIN 70/30 KWIKPEN) (70-30) 100 UNIT/ML SUPN   Subcutaneous   Inject 120 Units into the skin daily. Inject under skin 50 units before breakfast, 20 units before lunch and 50 units before dinner. Please inject 30 minutes before the meals.   5 pen   0   . Insulin Pen Needle (RA PEN NEEDLES) 31G X 5 MM MISC      Use 3x a day   200 each   3   . INSULIN SYRINGE 1CC/29G (SAFETY-LOK INSULIN SYR 1CC/29G) 29G X 1/2" 1 ML MISC   Does not apply   55 Units by Does not apply route 2 (two) times daily.   100 each   6   . lisinopril (PRINIVIL,ZESTRIL) 40 MG tablet   Oral   Take 1 tablet (40 mg total) by mouth daily.   90 tablet   3    . metoprolol (LOPRESSOR) 100 MG tablet   Oral   Take 100 mg by mouth daily.         . pravastatin (PRAVACHOL) 40 MG tablet   Oral   Take 40 mg by mouth daily.         Marland Kitchen sulfamethoxazole-trimethoprim (BACTRIM DS,SEPTRA DS) 800-160 MG per tablet   Oral   Take 1 tablet by mouth 2 (two) times daily.   14 tablet   0    BP 165/58  Pulse 104  Temp(Src) 98.3 F (36.8 C) (Oral)  Resp 20  SpO2 100% Physical Exam  Nursing note and vitals reviewed. Constitutional: She is oriented to person, place, and time. She appears well-developed and well-nourished. No distress.  HENT:  Head: Normocephalic and atraumatic.  Eyes: Conjunctivae and EOM are normal.  Neck: Normal range of motion.  Cardiovascular: Normal rate and regular rhythm.  Exam reveals no gallop and no friction rub.   No murmur heard. Pulmonary/Chest: Effort normal and breath sounds normal. She has no wheezes. She has no rales. She exhibits no tenderness.  Abdominal: Soft. She exhibits no distension. There is no tenderness. There is no rebound and no guarding.  Musculoskeletal: Normal range of motion.  Neurological: She is alert and oriented to person, place, and time. Coordination normal.  Speech is goal-oriented. Moves limbs without ataxia.   Skin: Skin is warm and dry.  Large area of induration and erythema with central incision from previous I&D. Packing intact with noted purulent drainage. The area is tender to palpate.   Psychiatric: She has a normal mood and affect. Her behavior is normal.    ED Course  Procedures (including critical care time) Labs Review Labs Reviewed - No data to display Imaging Review No results found.  EKG Interpretation   None       MDM   1. Wound check, abscess     12:45 PM Packing removed without complication. Patient reports she is not taking her antibiotics because they are making her sick. Patient reports tolerating clindamycin. I will prescribe the patient clindamycin.  Patient has a follow up with General surgery. Vitals stable and patient afebrile. Patient reports subjective healing.     Alvina Chou, PA-C 10/26/13 1255

## 2013-10-29 ENCOUNTER — Emergency Department (HOSPITAL_BASED_OUTPATIENT_CLINIC_OR_DEPARTMENT_OTHER)
Admission: EM | Admit: 2013-10-29 | Discharge: 2013-10-29 | Disposition: A | Discharge status: Home / Self Care | Payer: Medicare HMO | Attending: Emergency Medicine | Admitting: Emergency Medicine

## 2013-10-29 ENCOUNTER — Encounter (HOSPITAL_BASED_OUTPATIENT_CLINIC_OR_DEPARTMENT_OTHER): Payer: Self-pay | Admitting: Emergency Medicine

## 2013-10-29 DIAGNOSIS — L0291 Cutaneous abscess, unspecified: Secondary | ICD-10-CM

## 2013-10-29 DIAGNOSIS — Z792 Long term (current) use of antibiotics: Secondary | ICD-10-CM | POA: Insufficient documentation

## 2013-10-29 DIAGNOSIS — E669 Obesity, unspecified: Secondary | ICD-10-CM | POA: Insufficient documentation

## 2013-10-29 DIAGNOSIS — Z8619 Personal history of other infectious and parasitic diseases: Secondary | ICD-10-CM | POA: Insufficient documentation

## 2013-10-29 DIAGNOSIS — E119 Type 2 diabetes mellitus without complications: Secondary | ICD-10-CM | POA: Insufficient documentation

## 2013-10-29 DIAGNOSIS — Z794 Long term (current) use of insulin: Secondary | ICD-10-CM | POA: Insufficient documentation

## 2013-10-29 DIAGNOSIS — D45 Polycythemia vera: Secondary | ICD-10-CM | POA: Insufficient documentation

## 2013-10-29 DIAGNOSIS — I10 Essential (primary) hypertension: Secondary | ICD-10-CM | POA: Insufficient documentation

## 2013-10-29 DIAGNOSIS — Z7982 Long term (current) use of aspirin: Secondary | ICD-10-CM | POA: Insufficient documentation

## 2013-10-29 DIAGNOSIS — Z87891 Personal history of nicotine dependence: Secondary | ICD-10-CM | POA: Insufficient documentation

## 2013-10-29 DIAGNOSIS — Z8659 Personal history of other mental and behavioral disorders: Secondary | ICD-10-CM | POA: Insufficient documentation

## 2013-10-29 DIAGNOSIS — L03317 Cellulitis of buttock: Principal | ICD-10-CM

## 2013-10-29 DIAGNOSIS — L0231 Cutaneous abscess of buttock: Secondary | ICD-10-CM | POA: Insufficient documentation

## 2013-10-29 DIAGNOSIS — E785 Hyperlipidemia, unspecified: Secondary | ICD-10-CM | POA: Insufficient documentation

## 2013-10-29 DIAGNOSIS — Z79899 Other long term (current) drug therapy: Secondary | ICD-10-CM | POA: Insufficient documentation

## 2013-10-29 DIAGNOSIS — Z91040 Latex allergy status: Secondary | ICD-10-CM | POA: Insufficient documentation

## 2013-10-29 NOTE — ED Notes (Signed)
Follow up recheck of abscess to buttocks

## 2013-10-29 NOTE — ED Notes (Signed)
pa at bedside.

## 2013-10-29 NOTE — ED Provider Notes (Signed)
CSN: [id number]     Arrival date & time 10/29/13  1250 History   First MD Initiated Contact with Patient 10/29/13 1412     Chief Complaint  Patient presents with  . Wound Check   (Consider location/radiation/quality/duration/timing/severity/associated sxs/prior Treatment) Patient is a 73 y.o. female presenting with wound check. The history is provided by the patient. No language interpreter was used.  Wound Check This is a recurrent problem. The problem occurs constantly. The problem has been gradually improving. Nothing aggravates the symptoms. She has tried nothing for the symptoms. The treatment provided no relief.  Pt complains of continued drainage.   Pt had I and D by me on 1/7.    Past Medical History  Diagnosis Date  . Hypertension   . Hyperlipidemia   . Diabetes mellitus 2008  . Depression   . Obesity   . Psoriasis   . Polycythemia     Dr. Elease Hashimoto- [facility]  . Herpes simplex   . Gout   . Hyperglycemia 05/31/2013  . 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 HTN (hypertension) 05/31/2013   Past Surgical History  Procedure Laterality Date  . Breast surgery      milk duct  . Total abdominal hysterectomy w/ bilateral salpingoophorectomy      for heavy periods  . Tonsillectomy     Family History  Problem Relation Age of Onset  . Diabetes Mother   . Heart disease Mother     CAD  . Hyperlipidemia Mother   . Hypertension Mother   . Kidney disease Mother   . Kidney disease Father   . Cancer Other     breast   History  Substance Use Topics  . Smoking status: Former Smoker -- 14 years    Quit date: 10/19/1991  . Smokeless tobacco: Never Used  . Alcohol Use: No   OB History   Grav Para Term Preterm Abortions TAB SAB Ect Mult Living                 Review of Systems  Skin: Positive for wound.  All other systems reviewed and are negative.    Allergies  Doxycycline and Latex  Home Medications   Current Outpatient Rx  Name  Route  Sig  Dispense  Refill   . aspirin 81 MG EC tablet   Oral   Take 81 mg by mouth daily.           . cholecalciferol (VITAMIN D) 1000 UNITS tablet   Oral   Take 1,000 Units by mouth daily.         . clindamycin (CLEOCIN) 150 MG capsule   Oral   Take 3 capsules (450 mg total) by mouth 3 (three) times daily.   90 capsule   0   . clindamycin (CLEOCIN) 150 MG capsule   Oral   Take 2 capsules (300 mg total) by mouth 3 (three) times daily. May dispense as 141m capsules   60 capsule   0   . hydrochlorothiazide (HYDRODIURIL) 25 MG tablet      Take one tablet every other day for 3 days   3 tablet   0   . HYDROcodone-acetaminophen (NORCO/VICODIN) 5-325 MG per tablet   Oral   Take 1 tablet by mouth every 4 (four) hours as needed.   10 tablet   0   . hydroxyurea (HYDREA) 500 MG capsule   Oral   Take 500 mg by mouth 2 (two) times daily.          .Marland Kitchen  Insulin Isophane & Regular (HUMULIN 70/30 KWIKPEN) (70-30) 100 UNIT/ML SUPN   Subcutaneous   Inject 120 Units into the skin daily. Inject under skin 50 units before breakfast, 20 units before lunch and 50 units before dinner. Please inject 30 minutes before the meals.   5 pen   0   . Insulin Pen Needle (RA PEN NEEDLES) 31G X 5 MM MISC      Use 3x a day   200 each   3   . INSULIN SYRINGE 1CC/29G (SAFETY-LOK INSULIN SYR 1CC/29G) 29G X 1/2" 1 ML MISC   Does not apply   55 Units by Does not apply route 2 (two) times daily.   100 each   6   . lisinopril (PRINIVIL,ZESTRIL) 40 MG tablet   Oral   Take 1 tablet (40 mg total) by mouth daily.   90 tablet   3   . metoprolol (LOPRESSOR) 100 MG tablet   Oral   Take 100 mg by mouth daily.         . ondansetron (ZOFRAN ODT) 4 MG disintegrating tablet   Oral   Take 1 tablet (4 mg total) by mouth every 8 (eight) hours as needed for nausea or vomiting.   10 tablet   0   . pravastatin (PRAVACHOL) 40 MG tablet   Oral   Take 40 mg by mouth daily.          BP 196/72  Pulse 109  Temp(Src) 98 F  (36.7 C) (Oral)  Resp 20  SpO2 96% Physical Exam  Nursing note and vitals reviewed. Constitutional: She appears well-developed.  HENT:  Head: Normocephalic.  Cardiovascular: Normal rate.   Pulmonary/Chest: Effort normal.  Musculoskeletal: She exhibits tenderness.  Neurological: She is alert.  Skin: Skin is warm.  Psychiatric: She has a normal mood and affect.    ED Course  Procedures (including critical care time) Labs Review Labs Reviewed - No data to display Imaging Review No results found.  EKG Interpretation   None     open incision,  Draining.  Redness, swelling and induration have resolved,  ( My incision was over a previous incision and a deep pocket)    MDM   1. Abscess        Fransico Meadow, PA-C 10/30/13 1438

## 2013-10-29 NOTE — Discharge Instructions (Signed)
Abscess An abscess is an infected area that contains a collection of pus and debris.It can occur in almost any part of the body. An abscess is also known as a furuncle or boil. CAUSES  An abscess occurs when tissue gets infected. This can occur from blockage of oil or sweat glands, infection of hair follicles, or a minor injury to the skin. As the body tries to fight the infection, pus collects in the area and creates pressure under the skin. This pressure causes pain. People with weakened immune systems have difficulty fighting infections and get certain abscesses more often.  SYMPTOMS Usually an abscess develops on the skin and becomes a painful mass that is red, warm, and tender. If the abscess forms under the skin, you may feel a moveable soft area under the skin. Some abscesses break open (rupture) on their own, but most will continue to get worse without care. The infection can spread deeper into the body and eventually into the bloodstream, causing you to feel ill.  DIAGNOSIS  Your caregiver will take your medical history and perform a physical exam. A sample of fluid may also be taken from the abscess to determine what is causing your infection. TREATMENT  Your caregiver may prescribe antibiotic medicines to fight the infection. However, taking antibiotics alone usually does not cure an abscess. Your caregiver may need to make a small cut (incision) in the abscess to drain the pus. In some cases, gauze is packed into the abscess to reduce pain and to continue draining the area. HOME CARE INSTRUCTIONS   Only take over-the-counter or prescription medicines for pain, discomfort, or fever as directed by your caregiver.  If you were prescribed antibiotics, take them as directed. Finish them even if you start to feel better.  If gauze is used, follow your caregiver's directions for changing the gauze.  To avoid spreading the infection:  Keep your draining abscess covered with a  bandage.  Wash your hands well.  Do not share personal care items, towels, or whirlpools with others.  Avoid skin contact with others.  Keep your skin and clothes clean around the abscess.  Keep all follow-up appointments as directed by your caregiver. SEEK MEDICAL CARE IF:   You have increased pain, swelling, redness, fluid drainage, or bleeding.  You have muscle aches, chills, or a general ill feeling.  You have a fever. MAKE SURE YOU:   Understand these instructions.  Will watch your condition.  Will get help right away if you are not doing well or get worse. Document Released: 07/14/2005 Document Revised: 04/04/2012 Document Reviewed: 12/17/2011 [employer] Patient Information 2014 [employer].  

## 2013-10-30 ENCOUNTER — Encounter: Payer: Self-pay | Admitting: Internal Medicine

## 2013-10-30 ENCOUNTER — Encounter (HOSPITAL_BASED_OUTPATIENT_CLINIC_OR_DEPARTMENT_OTHER): Payer: Self-pay | Admitting: Emergency Medicine

## 2013-10-30 ENCOUNTER — Ambulatory Visit (INDEPENDENT_AMBULATORY_CARE_PROVIDER_SITE_OTHER): Payer: Medicare HMO | Admitting: Internal Medicine

## 2013-10-30 ENCOUNTER — Emergency Department (HOSPITAL_BASED_OUTPATIENT_CLINIC_OR_DEPARTMENT_OTHER)
Admission: EM | Admit: 2013-10-30 | Discharge: 2013-10-30 | Disposition: A | Discharge status: Home / Self Care | Payer: Medicare HMO | Attending: Emergency Medicine | Admitting: Emergency Medicine

## 2013-10-30 VITALS — BP 151/70 | HR 102 | Resp 18 | Ht 64.0 in | Wt 194.0 lb

## 2013-10-30 DIAGNOSIS — R739 Hyperglycemia, unspecified: Secondary | ICD-10-CM

## 2013-10-30 DIAGNOSIS — Z8589 Personal history of malignant neoplasm of other organs and systems: Secondary | ICD-10-CM | POA: Insufficient documentation

## 2013-10-30 DIAGNOSIS — R197 Diarrhea, unspecified: Secondary | ICD-10-CM | POA: Insufficient documentation

## 2013-10-30 DIAGNOSIS — E669 Obesity, unspecified: Secondary | ICD-10-CM | POA: Insufficient documentation

## 2013-10-30 DIAGNOSIS — M109 Gout, unspecified: Secondary | ICD-10-CM | POA: Insufficient documentation

## 2013-10-30 DIAGNOSIS — R110 Nausea: Secondary | ICD-10-CM | POA: Insufficient documentation

## 2013-10-30 DIAGNOSIS — E785 Hyperlipidemia, unspecified: Secondary | ICD-10-CM | POA: Insufficient documentation

## 2013-10-30 DIAGNOSIS — Z794 Long term (current) use of insulin: Secondary | ICD-10-CM | POA: Insufficient documentation

## 2013-10-30 DIAGNOSIS — Z7982 Long term (current) use of aspirin: Secondary | ICD-10-CM | POA: Insufficient documentation

## 2013-10-30 DIAGNOSIS — Z79899 Other long term (current) drug therapy: Secondary | ICD-10-CM | POA: Insufficient documentation

## 2013-10-30 DIAGNOSIS — Z8619 Personal history of other infectious and parasitic diseases: Secondary | ICD-10-CM | POA: Insufficient documentation

## 2013-10-30 DIAGNOSIS — E119 Type 2 diabetes mellitus without complications: Secondary | ICD-10-CM | POA: Insufficient documentation

## 2013-10-30 DIAGNOSIS — Z87891 Personal history of nicotine dependence: Secondary | ICD-10-CM | POA: Insufficient documentation

## 2013-10-30 DIAGNOSIS — L0231 Cutaneous abscess of buttock: Secondary | ICD-10-CM

## 2013-10-30 DIAGNOSIS — Z872 Personal history of diseases of the skin and subcutaneous tissue: Secondary | ICD-10-CM | POA: Insufficient documentation

## 2013-10-30 DIAGNOSIS — L03317 Cellulitis of buttock: Secondary | ICD-10-CM

## 2013-10-30 DIAGNOSIS — I10 Essential (primary) hypertension: Secondary | ICD-10-CM | POA: Insufficient documentation

## 2013-10-30 DIAGNOSIS — Z91040 Latex allergy status: Secondary | ICD-10-CM | POA: Insufficient documentation

## 2013-10-30 DIAGNOSIS — Z792 Long term (current) use of antibiotics: Secondary | ICD-10-CM | POA: Insufficient documentation

## 2013-10-30 DIAGNOSIS — R7309 Other abnormal glucose: Secondary | ICD-10-CM

## 2013-10-30 LAB — COMPREHENSIVE METABOLIC PANEL
ALK PHOS: 102 U/L (ref 39–117)
ALT: 11 U/L (ref 0–35)
AST: 26 U/L (ref 0–37)
Albumin: 3.2 g/dL — ABNORMAL LOW (ref 3.5–5.2)
BILIRUBIN TOTAL: 0.6 mg/dL (ref 0.3–1.2)
BUN: 7 mg/dL (ref 6–23)
CO2: 24 mEq/L (ref 19–32)
Calcium: 8.7 mg/dL (ref 8.4–10.5)
Chloride: 96 mEq/L (ref 96–112)
Creatinine, Ser: 0.7 mg/dL (ref 0.50–1.10)
GFR calc Af Amer: >90 mL/min (ref >90)
GFR calc non Af Amer: 83 mL/min — ABNORMAL LOW (ref >90)
GLUCOSE: 546 mg/dL — AB (ref 70–99)
POTASSIUM: 4.4 meq/L (ref 3.7–5.3)
Sodium: 132 mEq/L — ABNORMAL LOW (ref 137–147)
Total Protein: 6.1 g/dL (ref 6.0–8.3)

## 2013-10-30 LAB — URINALYSIS, ROUTINE W REFLEX MICROSCOPIC
BILIRUBIN URINE: NEGATIVE
KETONES UR: NEGATIVE mg/dL
Nitrite: NEGATIVE
PH: 5.5 (ref 5.0–8.0)
Protein, ur: NEGATIVE mg/dL
Specific Gravity, Urine: 1.03 (ref 1.005–1.030)
Urobilinogen, UA: 0.2 mg/dL (ref 0.0–1.0)

## 2013-10-30 LAB — GLUCOSE, CAPILLARY
GLUCOSE-CAPILLARY: 484 mg/dL — AB (ref 70–99)
GLUCOSE-CAPILLARY: 519 mg/dL — AB (ref 70–99)
Glucose-Capillary: 344 mg/dL — ABNORMAL HIGH (ref 70–99)

## 2013-10-30 LAB — CBC WITH DIFFERENTIAL/PLATELET
Basophils Absolute: 0.1 10*3/uL (ref 0.0–0.1)
Basophils Relative: 1 % (ref 0–1)
Eosinophils Absolute: 0.3 10*3/uL (ref 0.0–0.7)
Eosinophils Relative: 2 % (ref 0–5)
HCT: 41.3 % (ref 36.0–46.0)
HEMOGLOBIN: 14.4 g/dL (ref 12.0–15.0)
Lymphocytes Relative: 16 % (ref 12–46)
Lymphs Abs: 2.3 10*3/uL (ref 0.7–4.0)
MCH: 34.1 pg — AB (ref 26.0–34.0)
MCHC: 34.9 g/dL (ref 30.0–36.0)
MCV: 97.9 fL (ref 78.0–100.0)
MONOS PCT: 7 % (ref 3–12)
Monocytes Absolute: 0.9 10*3/uL (ref 0.1–1.0)
NEUTROS ABS: 10.3 10*3/uL — AB (ref 1.7–7.7)
Neutrophils Relative %: 74 % (ref 43–77)
Platelets: 531 10*3/uL — ABNORMAL HIGH (ref 150–400)
RBC: 4.22 MIL/uL (ref 3.87–5.11)
RDW: 11.7 % (ref 11.5–15.5)
WBC: 13.9 10*3/uL — ABNORMAL HIGH (ref 4.0–10.5)

## 2013-10-30 LAB — URINE MICROSCOPIC-ADD ON

## 2013-10-30 MED ORDER — PERMETHRIN 5 % EX CREA: TOPICAL_CREAM | CUTANEOUS | Status: DC

## 2013-10-30 MED ORDER — INSULIN REGULAR HUMAN 100 UNIT/ML IJ SOLN
15.0000 [IU] | Freq: Once | INTRAMUSCULAR | Status: AC
  Administered 2013-10-30: 15 [IU] via INTRAVENOUS
  Filled 2013-10-30: qty 1

## 2013-10-30 MED ORDER — SODIUM CHLORIDE 0.9 % IV BOLUS (SEPSIS)
1000.0000 mL | Freq: Once | INTRAVENOUS | Status: AC
  Administered 2013-10-30: 1000 mL via INTRAVENOUS

## 2013-10-30 NOTE — ED Provider Notes (Addendum)
CSN: [id number]     Arrival date & time 10/30/13  1041 History   First MD Initiated Contact with Patient 10/30/13 1111     Chief Complaint  Patient presents with  . high blood sugar sent from [facility]    (Consider location/radiation/quality/duration/timing/severity/associated sxs/prior Treatment) HPI Comments: Patient with an ongoing abscess of her left buttocks which has been drained twice in the emergency room and continues to drain.  She was seeing her doctor today in followup and found to have a blood sugar in the 500s despite taking her insulin this morning. Also she states she's had 2-3 days and diarrhea. Episode today and 3 episodes over the last 2 days. Mild nausea but no vomiting and no localized abdominal pain. Patient has been eating and drinking and they are changing the abscess dressings regularly he continues to drain purulent material. Patient does have followup with [facility]. However she states she's not been taking the clindamycin for the last 2 days because of it causing an upset stomach and diarrhea. She denies any fever. No cough or shortness of breath.  The history is provided by the patient and medical records.    Past Medical History  Diagnosis Date  . Hypertension   . Hyperlipidemia   . Diabetes mellitus 2008  . Depression   . Obesity   . Psoriasis   . Polycythemia     Dr. Elease Hashimoto- [facility]  . Herpes simplex   . Gout   . Hyperglycemia 05/31/2013  . 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 HTN (hypertension) 05/31/2013   Past Surgical History  Procedure Laterality Date  . Breast surgery      milk duct  . Total abdominal hysterectomy w/ bilateral salpingoophorectomy      for heavy periods  . Tonsillectomy     Family History  Problem Relation Age of Onset  . Diabetes Mother   . Heart disease Mother     CAD  . Hyperlipidemia Mother   . Hypertension Mother   . Kidney disease Mother   . Kidney disease Father   . Cancer Other    breast   History  Substance Use Topics  . Smoking status: Former Smoker -- 96 years    Quit date: 10/19/1991  . Smokeless tobacco: Never Used  . Alcohol Use: No   OB History   Grav Para Term Preterm Abortions TAB SAB Ect Mult Living                 Review of Systems  All other systems reviewed and are negative.    Allergies  Doxycycline and Latex  Home Medications   Current Outpatient Rx  Name  Route  Sig  Dispense  Refill  . aspirin 81 MG EC tablet   Oral   Take 81 mg by mouth daily.           . cholecalciferol (VITAMIN D) 1000 UNITS tablet   Oral   Take 1,000 Units by mouth daily.         . clindamycin (CLEOCIN) 150 MG capsule   Oral   Take 3 capsules (450 mg total) by mouth 3 (three) times daily.   90 capsule   0   . clindamycin (CLEOCIN) 150 MG capsule   Oral   Take 2 capsules (300 mg total) by mouth 3 (three) times daily. May dispense as 162m capsules   60 capsule   0   . hydrochlorothiazide (HYDRODIURIL) 25 MG tablet      Take  one tablet every other day for 3 days   3 tablet   0   . HYDROcodone-acetaminophen (NORCO/VICODIN) 5-325 MG per tablet   Oral   Take 1 tablet by mouth every 4 (four) hours as needed.   10 tablet   0   . hydroxyurea (HYDREA) 500 MG capsule   Oral   Take 500 mg by mouth 2 (two) times daily.          . Insulin Isophane & Regular (HUMULIN 70/30 KWIKPEN) (70-30) 100 UNIT/ML SUPN   Subcutaneous   Inject 120 Units into the skin daily. Inject under skin 50 units before breakfast, 20 units before lunch and 50 units before dinner. Please inject 30 minutes before the meals.   5 pen   0   . Insulin Pen Needle (RA PEN NEEDLES) 31G X 5 MM MISC      Use 3x a day   200 each   3   . INSULIN SYRINGE 1CC/29G (SAFETY-LOK INSULIN SYR 1CC/29G) 29G X 1/2" 1 ML MISC   Does not apply   55 Units by Does not apply route 2 (two) times daily.   100 each   6   . lisinopril (PRINIVIL,ZESTRIL) 40 MG tablet   Oral   Take 1 tablet  (40 mg total) by mouth daily.   90 tablet   3   . metoprolol (LOPRESSOR) 100 MG tablet   Oral   Take 100 mg by mouth daily.         . ondansetron (ZOFRAN ODT) 4 MG disintegrating tablet   Oral   Take 1 tablet (4 mg total) by mouth every 8 (eight) hours as needed for nausea or vomiting.   10 tablet   0   . pravastatin (PRAVACHOL) 40 MG tablet   Oral   Take 40 mg by mouth daily.         . promethazine (PHENERGAN) 12.5 MG tablet               . sulfamethoxazole-trimethoprim (BACTRIM DS) 800-160 MG per tablet                BP 147/72  Temp(Src) 98.1 F (36.7 C) (Oral)  Resp 20  SpO2 96% Physical Exam  Nursing note and vitals reviewed. Constitutional: She is oriented to person, place, and time. She appears well-developed and well-nourished. No distress.  HENT:  Head: Normocephalic and atraumatic.  Mouth/Throat: Oropharynx is clear and moist.  Eyes: Conjunctivae and EOM are normal. Pupils are equal, round, and reactive to light.  Neck: Normal range of motion. Neck supple.  Cardiovascular: Normal rate, regular rhythm and intact distal pulses.   No murmur heard. Pulmonary/Chest: Effort normal and breath sounds normal. No respiratory distress. She has no wheezes. She has no rales.  Abdominal: Soft. She exhibits no distension. There is no tenderness. There is no rebound and no guarding.  Genitourinary:     Musculoskeletal: Normal range of motion. She exhibits no edema and no tenderness.  Neurological: She is alert and oriented to person, place, and time.  Skin: Skin is warm and dry. No rash noted. No erythema.  Psychiatric: She has a normal mood and affect. Her behavior is normal.    ED Course  Procedures (including critical care time) Labs Review Labs Reviewed  URINALYSIS, ROUTINE W REFLEX MICROSCOPIC - Abnormal; Notable for the following:    Glucose, UA >1000 (*)    Hgb urine dipstick SMALL (*)    Leukocytes, UA TRACE (*)  All other components within  normal limits  CBC WITH DIFFERENTIAL - Abnormal; Notable for the following:    WBC 13.9 (*)    MCH 34.1 (*)    Platelets 531 (*)    Neutro Abs 10.3 (*)    All other components within normal limits  COMPREHENSIVE METABOLIC PANEL - Abnormal; Notable for the following:    Sodium 132 (*)    Glucose, Bld 546 (*)    Albumin 3.2 (*)    GFR calc non Af Amer 83 (*)    All other components within normal limits  GLUCOSE, CAPILLARY - Abnormal; Notable for the following:    Glucose-Capillary 484 (*)    All other components within normal limits  GLUCOSE, CAPILLARY - Abnormal; Notable for the following:    Glucose-Capillary 519 (*)    All other components within normal limits  URINE MICROSCOPIC-ADD ON - Abnormal; Notable for the following:    Squamous Epithelial / LPF FEW (*)    Bacteria, UA FEW (*)    All other components within normal limits  URINE CULTURE  CBC WITH DIFFERENTIAL   Imaging Review No results found.  EKG Interpretation   None       MDM   1. Hyperglycemia   2. Abscess of buttock, left     Patient with a history of diabetes and recurrent buttocks abscess was sent down by her PCP today for further evaluation because of diarrhea and hyperglycemia. Patient states she took her insulin 58 units of regular insulin at 8:30 this morning. On one episode of diarrhea today but 3 for the last 2 days. She denies any vomiting and has been eating and drinking.-looking abscess externally there are no signs of cellulitis at this time it is draining purulent material with mild tracking when probed. Low suspicion her retained pus pockets. Patient is now display any signs of DKA at this time as her urine has been T10 and other than sugar has no signs of infection. Patient is hyperglycemic today with a blood sugar in the 4 to 500s. However she is doing her insulin at noon and she ate when she got here. We'll give IV fluids and insulin to attempt to improve her blood sugar. However with a normal  CBC and CMP other than hyperglycemia the patient does not need any inpatient criteria. Abscess was packed with saline gauze and she does have followed up with [facility].  3:05 PM Pt's repeat sugar in the 300's.  She is requesting to go home.  Labs wnl other than hyperglycemia.  No signs of dka and no ketones in urine.  PCP calling tomorrow at 8am for appt with [facility].  Blanchie Dessert, MD 10/30/13 [place], MD 10/30/13 [phone]

## 2013-10-30 NOTE — ED Notes (Signed)
Sent from dr Myriam Jacobson for evaluation of elevated blood sugar and possible dehydration

## 2013-10-30 NOTE — Progress Notes (Signed)
   Subjective:    Patient ID: Amanda [name], female    DOB: March 21, 1941, 73 y.o.   MRN: [id number]  HPI  Amanda [name] is here for acute visit.  She is here with her husband.    She has been to ER for several visits to have I and D drained.    She cannot tell me the antibiotics she is taking (Epic shows cleocin and  Bactrim)  She is having severe diarrhea.  Drinking lots of fluids  She tells me she has moved to a new apartment and has not been taking her insulin  Glucose in office 573.     Review of Systems See HPI    Objective:   Physical Exam Physical Exam  Nursing note and vitals reviewed.  Constitutional: She is oriented to person, place, and time. She appears dehydrated.  Mucous membranes very dry.  FBS  573 HENT:  Head: Normocephalic and atraumatic.  Cardiovascular: Normal rate and regular rhythm. Exam reveals no gallop and no friction rub.  No murmur heard.  Pulmonary/Chest: Breath sounds normal. She has no wheezes. She has no rales.  Neurological: She is alert and oriented to person, place, and time.  Skin: Skin is warm and dry.  Psychiatric: She has a normal mood and affect. Her behavior is normal.             Assessment & Plan:  Uncontrolled diabetes with dehydration    To ER for further treatment.   :    Abscess  Post I and D

## 2013-10-31 LAB — URINE CULTURE
COLONY COUNT: NO GROWTH
Culture: NO GROWTH

## 2013-11-01 ENCOUNTER — Encounter (INDEPENDENT_AMBULATORY_CARE_PROVIDER_SITE_OTHER): Payer: Self-pay | Admitting: Surgery

## 2013-11-01 ENCOUNTER — Ambulatory Visit (INDEPENDENT_AMBULATORY_CARE_PROVIDER_SITE_OTHER): Payer: Medicare HMO | Admitting: Surgery

## 2013-11-01 VITALS — BP 146/88 | HR 88 | Temp 97.5°F | Resp 18 | Ht 64.0 in | Wt 193.4 lb

## 2013-11-01 DIAGNOSIS — L03317 Cellulitis of buttock: Principal | ICD-10-CM | POA: Insufficient documentation

## 2013-11-01 DIAGNOSIS — L0231 Cutaneous abscess of buttock: Secondary | ICD-10-CM | POA: Insufficient documentation

## 2013-11-01 MED ORDER — SULFAMETHOXAZOLE-TRIMETHOPRIM 400-80 MG PO TABS: 1.0000 | ORAL_TABLET | Freq: Two times a day (BID) | ORAL | Status: AC

## 2013-11-01 NOTE — Progress Notes (Signed)
General Surgery [facility]  Chief Complaint  Patient presents with  . New Evaluation    left buttock abscess - I&D in ER 10/29/2013 - referral from Dr. Karmen Bongo    HISTORY: Patient is a 73 year old female referred by her primary care provider for evaluation of a left buttock abscess. This has been a problem for the past 3 weeks. She has been to the emergency room on 2 occasions for incision and drainage and open packing. The most recent episode was on 10/29/2013. Patient was placed on oral antibiotics but due to gastrointestinal side effects these were discontinued. Patient is an insulin-dependent diabetic. She presents today for evaluation.  Past Medical History  Diagnosis Date  . Hypertension   . Hyperlipidemia   . Diabetes mellitus 2008  . Depression   . Obesity   . Psoriasis   . Polycythemia     Dr. Elease Hashimoto- [facility]  . Herpes simplex   . Gout   . Hyperglycemia 05/31/2013  . 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 HTN (hypertension) 05/31/2013    Current Outpatient Prescriptions  Medication Sig Dispense Refill  . aspirin 81 MG EC tablet Take 81 mg by mouth daily.        . hydrochlorothiazide (HYDRODIURIL) 25 MG tablet Take one tablet every other day for 3 days  3 tablet  0  . HYDROcodone-acetaminophen (NORCO/VICODIN) 5-325 MG per tablet Take 1 tablet by mouth every 4 (four) hours as needed.  10 tablet  0  . hydroxyurea (HYDREA) 500 MG capsule Take 500 mg by mouth 2 (two) times daily.       . Insulin Isophane & Regular (HUMULIN 70/30 KWIKPEN) (70-30) 100 UNIT/ML SUPN Inject 120 Units into the skin daily. Inject under skin 50 units before breakfast, 20 units before lunch and 50 units before dinner. Please inject 30 minutes before the meals.  5 pen  0  . Insulin Pen Needle (RA PEN NEEDLES) 31G X 5 MM MISC Use 3x a day  200 each  3  . INSULIN SYRINGE 1CC/29G (SAFETY-LOK INSULIN SYR 1CC/29G) 29G X 1/2" 1 ML MISC 55 Units by Does not apply route 2 (two)  times daily.  100 each  6  . lisinopril (PRINIVIL,ZESTRIL) 40 MG tablet Take 1 tablet (40 mg total) by mouth daily.  90 tablet  3  . metoprolol (LOPRESSOR) 100 MG tablet Take 100 mg by mouth daily.      . ondansetron (ZOFRAN ODT) 4 MG disintegrating tablet Take 1 tablet (4 mg total) by mouth every 8 (eight) hours as needed for nausea or vomiting.  10 tablet  0  . permethrin (ELIMITE) 5 % cream Apply to affected area once  60 g  0  . promethazine (PHENERGAN) 12.5 MG tablet       . cholecalciferol (VITAMIN D) 1000 UNITS tablet Take 1,000 Units by mouth daily.      . pravastatin (PRAVACHOL) 40 MG tablet Take 40 mg by mouth daily.      Marland Kitchen sulfamethoxazole-trimethoprim (BACTRIM) 400-80 MG per tablet Take 1 tablet by mouth 2 (two) times daily.  20 tablet  0   No current facility-administered medications for this visit.    Allergies  Allergen Reactions  . Doxycycline Hives, Swelling and Rash  . Latex Hives, Itching and Rash    Family History  Problem Relation Age of Onset  . Diabetes Mother   . Heart disease Mother     CAD  . Hyperlipidemia Mother   . Hypertension  Mother   . Kidney disease Mother   . Kidney disease Father   . Cancer Other     breast    History   Social History  . Marital Status: Married    Spouse Name: N/A    Number of Children: N/A  . Years of Education: N/A   Social History Main Topics  . Smoking status: Former Smoker -- 52 years    Quit date: 10/19/1991  . Smokeless tobacco: Never Used  . Alcohol Use: No  . Drug Use: No  . Sexual Activity: Yes    Birth Control/ Protection: None   Other Topics Concern  . None   Social History Narrative  . None    REVIEW OF SYSTEMS - PERTINENT POSITIVES ONLY: Patient notes pink drainage from the wound, persistent discomfort.  EXAM: Filed Vitals:   11/01/13 1500  BP: 146/88  Pulse: 88  Temp: 97.5 F (36.4 C)  Resp: 18    GENERAL: well-developed, well-nourished, no acute distress HEENT: normocephalic;  pupils equal and reactive; sclerae clear; dentition good; mucous membranes moist NECK:  symmetric on extension; no palpable anterior or posterior cervical lymphadenopathy; no supraclavicular masses; no tenderness GU:   Examination of the perineum shows a stellate wound on the anterior left buttock with surrounding induration and mild erythema; wound is probed with a Q-tip to a depth of approximately 4 cm in the subcutaneous tissues; there is pain thick fluid and necrotic adipose tissue which are removed from the wound; wound is packed with quarter-inch iodoform gauze packing and covered with dry gauze dressing EXT:  non-tender without edema; no deformity NEURO: no gross focal deficits; no sign of tremor   LABORATORY RESULTS: See [facility] (CHL-Epic) for most recent results  RADIOLOGY RESULTS: See [facility] (CHL-Epic) for most recent results  IMPRESSION: #1 left buttock abscess with cellulitis, status post incision and drainage #2 insulin-dependent diabetes mellitus  PLAN: Patient and I and her husband discussed these findings. I would like to leave the packing in place for 48 hours. I would like her to return in 4 days for wound check here in our office. Given her diabetes, and the residual erythema and induration, I would like to start her on oral antibiotics. She states she does not have an allergy to sulfa. Therefore I am going to start her on Bactrim twice daily for 10 days.  Patient will return in 4 days for wound check here at our office.  Earnstine Regal, MD, [facility]  Primary Care Physician: Kelton Pillar, MD

## 2013-11-01 NOTE — Patient Instructions (Signed)
Shower daily with antibacterial soap.  Remove packing from the wound on Saturday, 11/03/2013.  Keep the wound covered with dry absorbent gauze.  Begin antibiotics and take for the full 10 day course.  Return for wound check at surgeon's office in 4 days.  Earnstine Regal, MD, [facility] Office: [phone]

## 2013-11-03 NOTE — ED Provider Notes (Signed)
Medical screening examination/treatment/procedure(s) were performed by non-physician practitioner and as supervising physician I was immediately available for consultation/collaboration.  EKG Interpretation   None         Tanna Furry, MD 11/03/13 [phone]

## 2013-11-05 ENCOUNTER — Ambulatory Visit (INDEPENDENT_AMBULATORY_CARE_PROVIDER_SITE_OTHER): Payer: Medicare HMO | Admitting: General Surgery

## 2013-11-05 ENCOUNTER — Encounter (INDEPENDENT_AMBULATORY_CARE_PROVIDER_SITE_OTHER): Payer: Self-pay | Admitting: General Surgery

## 2013-11-05 VITALS — BP 154/88 | HR 88 | Temp 97.2°F | Resp 20 | Ht 64.0 in | Wt 197.6 lb

## 2013-11-05 DIAGNOSIS — Z09 Encounter for follow-up examination after completed treatment for conditions other than malignant neoplasm: Secondary | ICD-10-CM

## 2013-11-05 NOTE — Progress Notes (Signed)
Subjective:     Patient ID: Amanda [name], female   DOB: 1941/07/30, 73 y.o.   MRN: [id number]  HPI 73 year old morbidly obese diabetic Caucasian female comes in for followup after going incision and drainage of a left anterior buttock abscess by Dr. Harlow Asa on the 15th. It had been going on for several weeks. He placed her on Bactrim. She comes in today for wound check. She denies any fevers or chills. She denies any drainage. She denies any difficulty or pain urinating. She reports that it is less uncomfortable.  Review of Systems     Objective:   Physical Exam BP 154/88  Pulse 88  Temp(Src) 97.2 F (36.2 C) (Temporal)  Resp 20  Ht 5' 4"  (1.626 m)  Wt 197 lb 9.6 oz (89.631 kg)  BMI 33.90 kg/m2 Alert, no apparent distress Buttock-left anterior buttock reveals an open wound of approximately 1 inch. There is some surrounding induration of the skin mainly posterior lateral. There really isn't any cellulitis. The cavity tracks about 4 cm deep. There is a necrotic piece of fat protruding from the wound which was sharply excised with scissors.    Assessment:     Morbid obesity Diabetes mellitus Left buttock abscess status post incision and drainage     Plan:     It is slowly improving. There is minimal cellulitis. I instructed the patient to finish out her Bactrim prescription. I did place a new piece of packing strip just barely in the wound in order to keep the skin edges separated. We discussed the importance of the wound healing from the bottom up. She will followup later in the office this week with Dr. Harlow Asa.  Leighton Ruff. Redmond Pulling, MD, FACS General, Bariatric, & Minimally Invasive Surgery [facility], [place]

## 2013-11-05 NOTE — ED Provider Notes (Signed)
History/physical exam/procedure(s) were performed by non-physician practitioner and as supervising physician I was immediately available for consultation/collaboration. I have reviewed all notes and am in agreement with care and plan.   Shaune Pollack, MD 11/05/13 [phone]

## 2013-11-05 NOTE — Patient Instructions (Signed)
Finish taking Bactrim antibiotic as prescribed Remove packing strip on Wednesday - doesn't require packing at that point  Call for Temp >101, worsening pain, pain urinating

## 2013-11-07 ENCOUNTER — Ambulatory Visit (INDEPENDENT_AMBULATORY_CARE_PROVIDER_SITE_OTHER): Payer: Medicare HMO

## 2013-11-07 NOTE — Progress Notes (Unsigned)
Patient arrived for nurse only packing left buttock. Afebrile,left lateral gluteus maximus  No drainage,redness,odor noted . Packed area with 1/4 plain Iodoform packing. Patient tolerated well. Advise to call if redness,drainage,odor ,temp 100.3 or greater and return on Friday 11-09-13 Patient voiced  understanding

## 2013-11-09 ENCOUNTER — Ambulatory Visit (INDEPENDENT_AMBULATORY_CARE_PROVIDER_SITE_OTHER): Payer: Medicare HMO | Admitting: Surgery

## 2013-11-09 ENCOUNTER — Encounter (INDEPENDENT_AMBULATORY_CARE_PROVIDER_SITE_OTHER): Payer: Self-pay | Admitting: Surgery

## 2013-11-09 VITALS — BP 140/83 | HR 72 | Temp 98.1°F | Resp 18 | Ht 64.5 in | Wt 200.6 lb

## 2013-11-09 DIAGNOSIS — L0231 Cutaneous abscess of buttock: Secondary | ICD-10-CM

## 2013-11-09 DIAGNOSIS — L03317 Cellulitis of buttock: Principal | ICD-10-CM

## 2013-11-09 NOTE — Progress Notes (Signed)
General Surgery [facility]  Chief Complaint  Patient presents with  . Follow-up    buttock abscess    HISTORY: Patient is a 73 year old female with a left buttock abscess. She returns today for wound check and dressing change. She continues to shower the wound several times a day. She is completing her course of oral antibiotics.  EXAM: There is an open wound on the anterior left buttock. This measures 1.5 x 1 cm in size. There appears to be granulation tissue within the wound cavity. Wound is probed to a depth of 4 cm with a Q-tip. There is no purulent drainage. Wound is packed with quarter inch iodoform gauze packing and a dry gauze dressing is placed.  IMPRESSION: Left buttock abscess, resolving  PLAN: The patient will remove the packing in 48 hours. She will continue to shower the wound and cover with a dry gauze dressing. She will complete her course of oral antibiotics. Patient will return in one week for wound check.  Earnstine Regal, MD, [facility]   Visit Diagnoses: 1. Cellulitis and abscess of buttock, left

## 2013-11-09 NOTE — Patient Instructions (Signed)
Remove packing on Monday, January 26.  Shower daily and cleansed skin around wound.  Cover wound with dry gauze dressing.  Complete entire course of oral antibiotics.  Earnstine Regal, MD, [facility] Office: [phone]

## 2013-11-19 ENCOUNTER — Encounter (INDEPENDENT_AMBULATORY_CARE_PROVIDER_SITE_OTHER): Payer: Self-pay | Admitting: Surgery

## 2013-11-19 ENCOUNTER — Ambulatory Visit (INDEPENDENT_AMBULATORY_CARE_PROVIDER_SITE_OTHER): Payer: Medicare HMO | Admitting: Surgery

## 2013-11-19 VITALS — BP 148/80 | HR 84 | Temp 98.2°F | Resp 18 | Ht 64.5 in | Wt 194.4 lb

## 2013-11-19 DIAGNOSIS — L03317 Cellulitis of buttock: Secondary | ICD-10-CM

## 2013-11-19 DIAGNOSIS — L0231 Cutaneous abscess of buttock: Secondary | ICD-10-CM

## 2013-11-19 DIAGNOSIS — B373 Candidiasis of vulva and vagina: Secondary | ICD-10-CM | POA: Insufficient documentation

## 2013-11-19 DIAGNOSIS — B3731 Acute candidiasis of vulva and vagina: Secondary | ICD-10-CM | POA: Insufficient documentation

## 2013-11-19 MED ORDER — NYSTATIN 100000 UNIT/GM EX CREA: 1.0000 "application " | TOPICAL_CREAM | Freq: Two times a day (BID) | CUTANEOUS | Status: DC

## 2013-11-19 MED ORDER — FLUCONAZOLE 100 MG PO TABS: 200.0000 mg | ORAL_TABLET | Freq: Every day | ORAL | Status: AC

## 2013-11-19 NOTE — Progress Notes (Signed)
General Surgery [facility]  Chief Complaint  Patient presents with  . Follow-up    buttock abscess    HISTORY: Patient is a 73 year old female followed for a left buttock abscess. Her last visit one week ago required packing of the wound. Packing was removed in 2 days. She has seen minimal drainage. Pain has resolved.  EXAM: Surgical wound on the left anterior buttock appears closed. It is gently probed with a Q-tip and there is no cavity remaining. There is no fluctuance. There is no cellulitis.  There is a significant candidal infection involving the labia, anterior buttock, and medial thigh.  IMPRESSION: #1 left buttock abscess, resolved #2 candidiasis perineum  PLAN: Wound care instructions are given to the patient. I have prescribed Diflucan 200 mg daily for 5 days and topical Mycostatin cream to be used for 10 days. Patient will continue to shower the wound anchored the area dry.  Patient will return for surgical care as needed.  Earnstine Regal, MD, [facility]   Visit Diagnoses: 1. Candidiasis of genitalia in female   2. Cellulitis and abscess of buttock, left

## 2013-11-19 NOTE — Patient Instructions (Signed)
Take Diflucan for 5 days as prescribed.  Apply Mycostatin cream to affected area 4 times daily for 10 days.  Earnstine Regal, MD, [facility] Office: [phone]

## 2013-12-07 ENCOUNTER — Telehealth: Payer: Self-pay | Admitting: *Deleted

## 2013-12-07 ENCOUNTER — Telehealth: Payer: Self-pay | Admitting: Hematology and Oncology

## 2013-12-07 NOTE — Telephone Encounter (Signed)
Pt missed appt in January due to her husband being sick.  She now asks if she can see Dr. Alvy Bimler next week?  She is concerned she has developed some red spots on her hands that look like "dried blood spots. "

## 2013-12-07 NOTE — Telephone Encounter (Signed)
, °

## 2013-12-07 NOTE — Telephone Encounter (Signed)
I can see her Monday prob 15 mins with labs

## 2013-12-07 NOTE — Telephone Encounter (Signed)
Instructed pt to come on Monday at 12;45 pm for lab and see Dr. Alvy Bimler at 1:15 pm.  POF sent.  She verbalized understanding.

## 2013-12-10 ENCOUNTER — Encounter (HOSPITAL_BASED_OUTPATIENT_CLINIC_OR_DEPARTMENT_OTHER): Payer: Self-pay | Admitting: Emergency Medicine

## 2013-12-10 ENCOUNTER — Telehealth: Payer: Self-pay | Admitting: Internal Medicine

## 2013-12-10 ENCOUNTER — Ambulatory Visit (HOSPITAL_BASED_OUTPATIENT_CLINIC_OR_DEPARTMENT_OTHER): Payer: Medicare HMO | Admitting: Hematology and Oncology

## 2013-12-10 ENCOUNTER — Telehealth: Payer: Self-pay | Admitting: *Deleted

## 2013-12-10 ENCOUNTER — Telehealth: Payer: Self-pay | Admitting: Hematology and Oncology

## 2013-12-10 ENCOUNTER — Encounter: Payer: Self-pay | Admitting: Hematology and Oncology

## 2013-12-10 ENCOUNTER — Emergency Department (HOSPITAL_BASED_OUTPATIENT_CLINIC_OR_DEPARTMENT_OTHER)
Admission: EM | Admit: 2013-12-10 | Discharge: 2013-12-10 | Disposition: A | Discharge status: Home / Self Care | Payer: Medicare HMO | Attending: Emergency Medicine | Admitting: Emergency Medicine

## 2013-12-10 ENCOUNTER — Other Ambulatory Visit (HOSPITAL_BASED_OUTPATIENT_CLINIC_OR_DEPARTMENT_OTHER): Payer: Medicare HMO

## 2013-12-10 VITALS — BP 175/62 | HR 95 | Temp 97.9°F | Resp 20 | Ht 64.5 in | Wt 194.2 lb

## 2013-12-10 DIAGNOSIS — E785 Hyperlipidemia, unspecified: Secondary | ICD-10-CM | POA: Insufficient documentation

## 2013-12-10 DIAGNOSIS — R630 Anorexia: Secondary | ICD-10-CM | POA: Insufficient documentation

## 2013-12-10 DIAGNOSIS — L03317 Cellulitis of buttock: Secondary | ICD-10-CM

## 2013-12-10 DIAGNOSIS — D47Z9 Other specified neoplasms of uncertain behavior of lymphoid, hematopoietic and related tissue: Secondary | ICD-10-CM

## 2013-12-10 DIAGNOSIS — Z79899 Other long term (current) drug therapy: Secondary | ICD-10-CM | POA: Insufficient documentation

## 2013-12-10 DIAGNOSIS — Z7982 Long term (current) use of aspirin: Secondary | ICD-10-CM | POA: Insufficient documentation

## 2013-12-10 DIAGNOSIS — Z8619 Personal history of other infectious and parasitic diseases: Secondary | ICD-10-CM | POA: Insufficient documentation

## 2013-12-10 DIAGNOSIS — E119 Type 2 diabetes mellitus without complications: Secondary | ICD-10-CM | POA: Insufficient documentation

## 2013-12-10 DIAGNOSIS — E669 Obesity, unspecified: Secondary | ICD-10-CM | POA: Insufficient documentation

## 2013-12-10 DIAGNOSIS — D45 Polycythemia vera: Secondary | ICD-10-CM

## 2013-12-10 DIAGNOSIS — Z8659 Personal history of other mental and behavioral disorders: Secondary | ICD-10-CM | POA: Insufficient documentation

## 2013-12-10 DIAGNOSIS — R110 Nausea: Secondary | ICD-10-CM | POA: Insufficient documentation

## 2013-12-10 DIAGNOSIS — R51 Headache: Secondary | ICD-10-CM | POA: Insufficient documentation

## 2013-12-10 DIAGNOSIS — Z794 Long term (current) use of insulin: Secondary | ICD-10-CM | POA: Insufficient documentation

## 2013-12-10 DIAGNOSIS — L0231 Cutaneous abscess of buttock: Secondary | ICD-10-CM | POA: Insufficient documentation

## 2013-12-10 DIAGNOSIS — R739 Hyperglycemia, unspecified: Secondary | ICD-10-CM

## 2013-12-10 DIAGNOSIS — I10 Essential (primary) hypertension: Secondary | ICD-10-CM | POA: Insufficient documentation

## 2013-12-10 DIAGNOSIS — Z91040 Latex allergy status: Secondary | ICD-10-CM | POA: Insufficient documentation

## 2013-12-10 DIAGNOSIS — Z87891 Personal history of nicotine dependence: Secondary | ICD-10-CM | POA: Insufficient documentation

## 2013-12-10 LAB — I-STAT VENOUS BLOOD GAS, ED
BICARBONATE: 26.3 meq/L — AB (ref 20.0–24.0)
O2 SAT: 57 %
PO2 VEN: 31 mmHg (ref 30.0–45.0)
TCO2: 28 mmol/L (ref 0–100)
pCO2, Ven: 45.4 mmHg (ref 45.0–50.0)
pH, Ven: 7.37 — ABNORMAL HIGH (ref 7.250–7.300)

## 2013-12-10 LAB — COMPREHENSIVE METABOLIC PANEL (CC13)
ALK PHOS: 108 U/L (ref 40–150)
ALT: 18 U/L (ref 0–55)
AST: 27 U/L (ref 5–34)
Albumin: 3.7 g/dL (ref 3.5–5.0)
Anion Gap: 12 mEq/L — ABNORMAL HIGH (ref 3–11)
BILIRUBIN TOTAL: 0.83 mg/dL (ref 0.20–1.20)
BUN: 7.9 mg/dL (ref 7.0–26.0)
CO2: 21 mEq/L — ABNORMAL LOW (ref 22–29)
Calcium: 9.5 mg/dL (ref 8.4–10.4)
Chloride: 100 mEq/L (ref 98–109)
Creatinine: 1.2 mg/dL — ABNORMAL HIGH (ref 0.6–1.1)
GLUCOSE: 635 mg/dL — AB (ref 70–140)
Potassium: 4 mEq/L (ref 3.5–5.1)
Sodium: 133 mEq/L — ABNORMAL LOW (ref 136–145)
Total Protein: 6.7 g/dL (ref 6.4–8.3)

## 2013-12-10 LAB — CBC WITH DIFFERENTIAL/PLATELET
BASO%: 0.4 % (ref 0.0–2.0)
BASOS ABS: 0.1 10*3/uL (ref 0.0–0.1)
BASOS PCT: 0 % (ref 0–1)
Basophils Absolute: 0 10*3/uL (ref 0.0–0.1)
EOS ABS: 0.4 10*3/uL (ref 0.0–0.5)
EOS%: 3.6 % (ref 0.0–7.0)
Eosinophils Absolute: 0.7 10*3/uL (ref 0.0–0.7)
Eosinophils Relative: 4 % (ref 0–5)
HCT: 43.5 % (ref 34.8–46.6)
HCT: 45.9 % (ref 36.0–46.0)
HEMOGLOBIN: 14.9 g/dL (ref 11.6–15.9)
HEMOGLOBIN: 15.9 g/dL — AB (ref 12.0–15.0)
LYMPH%: 18.7 % (ref 14.0–49.7)
LYMPHS ABS: 3 10*3/uL (ref 0.7–4.0)
LYMPHS PCT: 17 % (ref 12–46)
MCH: 32.7 pg (ref 25.1–34.0)
MCH: 32.8 pg (ref 26.0–34.0)
MCHC: 34.3 g/dL (ref 31.5–36.0)
MCHC: 34.6 g/dL (ref 30.0–36.0)
MCV: 95.6 fL (ref 79.5–101.0)
MCV: 94.6 fL (ref 78.0–100.0)
MONO#: 0.6 10*3/uL (ref 0.1–0.9)
MONO%: 4.9 % (ref 0.0–14.0)
Monocytes Absolute: 1.1 10*3/uL — ABNORMAL HIGH (ref 0.1–1.0)
Monocytes Relative: 6 % (ref 3–12)
NEUT%: 72.4 % (ref 38.4–76.8)
NEUTROS ABS: 8.3 10*3/uL — AB (ref 1.5–6.5)
Neutro Abs: 12.9 10*3/uL — ABNORMAL HIGH (ref 1.7–7.7)
Neutrophils Relative %: 73 % (ref 43–77)
PLATELETS: 437 10*3/uL — AB (ref 145–400)
Platelets: 584 10*3/uL — ABNORMAL HIGH (ref 150–400)
RBC: 4.55 10*6/uL (ref 3.70–5.45)
RBC: 4.85 MIL/uL (ref 3.87–5.11)
RDW: 13.5 % (ref 11.2–14.5)
RDW: 13.3 % (ref 11.5–15.5)
WBC: 11.5 10*3/uL — ABNORMAL HIGH (ref 3.9–10.3)
WBC: 17.7 10*3/uL — ABNORMAL HIGH (ref 4.0–10.5)
lymph#: 2.2 10*3/uL (ref 0.9–3.3)

## 2013-12-10 LAB — URINALYSIS, ROUTINE W REFLEX MICROSCOPIC
Bilirubin Urine: NEGATIVE
Glucose, UA: >1000 mg/dL — AB
Hgb urine dipstick: NEGATIVE
Ketones, ur: NEGATIVE mg/dL
Leukocytes, UA: NEGATIVE
NITRITE: NEGATIVE
PROTEIN: NEGATIVE mg/dL
SPECIFIC GRAVITY, URINE: 1.034 — AB (ref 1.005–1.030)
Urobilinogen, UA: 0.2 mg/dL (ref 0.0–1.0)
pH: 5 (ref 5.0–8.0)

## 2013-12-10 LAB — FERRITIN CHCC: FERRITIN: 96 ng/mL (ref 9–269)

## 2013-12-10 LAB — COMPREHENSIVE METABOLIC PANEL
ALK PHOS: 115 U/L (ref 39–117)
ALT: 20 U/L (ref 0–35)
AST: 35 U/L (ref 0–37)
Albumin: 4 g/dL (ref 3.5–5.2)
BUN: 8 mg/dL (ref 6–23)
CALCIUM: 9.9 mg/dL (ref 8.4–10.5)
CO2: 24 mEq/L (ref 19–32)
Chloride: 96 mEq/L (ref 96–112)
Creatinine, Ser: 0.7 mg/dL (ref 0.50–1.10)
GFR calc non Af Amer: 83 mL/min — ABNORMAL LOW (ref >90)
GLUCOSE: 443 mg/dL — AB (ref 70–99)
POTASSIUM: 4 meq/L (ref 3.7–5.3)
SODIUM: 135 meq/L — AB (ref 137–147)
TOTAL PROTEIN: 7.6 g/dL (ref 6.0–8.3)
Total Bilirubin: 0.8 mg/dL (ref 0.3–1.2)

## 2013-12-10 LAB — URINE MICROSCOPIC-ADD ON

## 2013-12-10 LAB — CBG MONITORING, ED
Glucose-Capillary: 419 mg/dL — ABNORMAL HIGH (ref 70–99)
Glucose-Capillary: 239 mg/dL — ABNORMAL HIGH (ref 70–99)

## 2013-12-10 MED ORDER — HYDROXYUREA 500 MG PO CAPS: 500.0000 mg | ORAL_CAPSULE | Freq: Two times a day (BID) | ORAL | Status: DC

## 2013-12-10 MED ORDER — SODIUM CHLORIDE 0.9 % IV BOLUS (SEPSIS)
1000.0000 mL | Freq: Once | INTRAVENOUS | Status: AC
  Administered 2013-12-10: 1000 mL via INTRAVENOUS

## 2013-12-10 MED ORDER — INSULIN REGULAR HUMAN 100 UNIT/ML IJ SOLN
10.0000 [IU] | Freq: Once | INTRAMUSCULAR | Status: AC
  Administered 2013-12-10: 10 [IU] via SUBCUTANEOUS
  Filled 2013-12-10: qty 1

## 2013-12-10 NOTE — Progress Notes (Signed)
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 The patient had bone marrow aspirate and biopsy performed in [place] and confirmed myeloproliferative disorder. We do not have records of those results. She had been treated with phlebotomy, hydroxyurea and aspirin.   INTERVAL HISTORY: Amanda [name] 73 y.o. female returns for further followup. Over the last 3 months, the patient had buttock abscess, requiring incision and drainage. It has subsequently healed. Her aspirin was placed on hold due to risk of bleeding. Denies any recent diagnosis of blood clot. She denies any recent fever, chills, night sweats or abnormal weight loss She complained of skin bruises.The patient denies any recent signs or symptoms of bleeding such as spontaneous epistaxis, hematuria or hematochezia.  I have reviewed the past medical history, past surgical history, social history and family history with the patient and they are unchanged from previous note.  ALLERGIES:  is allergic to doxycycline and latex.  MEDICATIONS:  Current Outpatient Prescriptions  Medication Sig Dispense Refill  . aspirin 81 MG EC tablet Take 81 mg by mouth daily.        . cholecalciferol (VITAMIN D) 1000 UNITS tablet Take 1,000 Units by mouth daily.      . hydroxyurea (HYDREA) 500 MG capsule Take 1 capsule (500 mg total) by mouth 2 (two) times daily.  60 capsule  6  . Insulin Isophane & Regular (HUMULIN 70/30 KWIKPEN) (70-30) 100 UNIT/ML SUPN Inject 120 Units into the skin daily.  Inject under skin 50 units before breakfast, 20 units before lunch and 50 units before dinner. Please inject 30 minutes before the meals.  5 pen  0  . Insulin Pen Needle (RA PEN NEEDLES) 31G X 5 MM MISC Use 3x a day  200 each  3  . INSULIN SYRINGE 1CC/29G (SAFETY-LOK INSULIN SYR 1CC/29G) 29G X 1/2" 1 ML MISC 55 Units by Does not apply route 2 (two) times daily.  100 each  6  . lisinopril (PRINIVIL,ZESTRIL) 40 MG tablet Take 1 tablet (40 mg total) by mouth daily.  90 tablet  3  . metoprolol (LOPRESSOR) 100 MG tablet Take 100 mg by mouth daily.      Marland Kitchen nystatin cream (MYCOSTATIN) Apply 1 application topically 2 (two) times daily.  30 g  3  . permethrin (ELIMITE) 5 % cream Apply to affected area once  60 g  0  . pravastatin (PRAVACHOL) 40 MG tablet Take 40 mg by mouth daily.      . hydrochlorothiazide (HYDRODIURIL) 25 MG tablet Take one tablet every other day for 3 days  3 tablet  0   No current facility-administered medications for this visit.    REVIEW OF SYSTEMS:   Constitutional: Denies fevers, chills or abnormal weight loss Eyes: Denies blurriness of vision Ears, nose, mouth, throat, and face: Denies mucositis or sore throat Respiratory: Denies cough, dyspnea or wheezes Cardiovascular: Denies palpitation, chest discomfort or lower extremity swelling Gastrointestinal:  Denies nausea, heartburn or change in bowel habits Skin: Denies abnormal skin rashes Lymphatics: Denies new lymphadenopathy  Neurological:Denies numbness, tingling or new weaknesses Behavioral/Psych: Mood is stable, no  new changes  All other systems were reviewed with the patient and are negative.  PHYSICAL EXAMINATION: ECOG PERFORMANCE STATUS: 0 - Asymptomatic  Filed Vitals:   12/10/13 1307  BP: 175/62  Pulse: 95  Temp: 97.9 F (36.6 C)  Resp: 20   Filed Weights   12/10/13 1307  Weight: 194 lb 3.2 oz (88.089 kg)    GENERAL:alert, no distress and comfortable SKIN: skin color, texture, turgor are normal, no  rashes or significant lesions. Mild skin bruising is noted EYES: normal, Conjunctiva are pink and non-injected, sclera clear OROPHARYNX:no exudate, no erythema and lips, buccal mucosa, and tongue normal  NECK: supple, thyroid normal size, non-tender, without nodularity LYMPH:  no palpable lymphadenopathy in the cervical, axillary or inguinal LUNGS: clear to auscultation and percussion with normal breathing effort HEART: regular rate & rhythm and no murmurs and no lower extremity edema ABDOMEN:abdomen soft, non-tender and normal bowel sounds Musculoskeletal:no cyanosis of digits and no clubbing  NEURO: alert & oriented x 3 with fluent speech, no focal motor/sensory deficits  LABORATORY DATA:  I have reviewed the data as listed    Component Value Date/Time   NA 132* 10/30/2013 1215   NA 137 02/16/2013 0900   K 4.4 10/30/2013 1215   K 3.8 02/16/2013 0900   CL 96 10/30/2013 1215   CL 103 02/16/2013 0900   CO2 24 10/30/2013 1215   CO2 25 02/16/2013 0900   GLUCOSE 546* 10/30/2013 1215   GLUCOSE 396* 02/16/2013 0900   GLUCOSE 292 06/02/2010   BUN 7 10/30/2013 1215   BUN 6.6* 02/16/2013 0900   CREATININE 0.70 10/30/2013 1215   CREATININE 0.9 02/16/2013 0900   CALCIUM 8.7 10/30/2013 1215   CALCIUM 8.8 02/16/2013 0900   PROT 6.1 10/30/2013 1215   PROT 6.2* 02/16/2013 0900   ALBUMIN 3.2* 10/30/2013 1215   ALBUMIN 3.1* 02/16/2013 0900   AST 26 10/30/2013 1215   AST 29 02/16/2013 0900   ALT 11 10/30/2013 1215   ALT 17 02/16/2013 0900   ALKPHOS 102 10/30/2013 1215   ALKPHOS 118 02/16/2013 0900   BILITOT 0.6 10/30/2013 1215   BILITOT 0.88 02/16/2013 0900   GFRNONAA 83* 10/30/2013 1215   GFRAA >90 10/30/2013 1215    No results found for this basename: SPEP,  UPEP,   kappa and lambda light chains    Lab Results  Component Value Date   WBC 11.5* 12/10/2013   NEUTROABS 8.3* 12/10/2013   HGB 14.9 12/10/2013   HCT 43.5 12/10/2013   MCV 95.6 12/10/2013   PLT 437* 12/10/2013      Chemistry      Component Value Date/Time   NA  132* 10/30/2013 1215   NA 137 02/16/2013 0900   K 4.4 10/30/2013 1215   K 3.8 02/16/2013 0900   CL 96 10/30/2013 1215   CL 103 02/16/2013 0900   CO2 24 10/30/2013 1215   CO2 25 02/16/2013 0900   BUN 7 10/30/2013 1215   BUN 6.6* 02/16/2013 0900   CREATININE 0.70 10/30/2013 1215   CREATININE 0.9 02/16/2013 0900      Component Value Date/Time   CALCIUM 8.7 10/30/2013 1215   CALCIUM 8.8 02/16/2013 0900   ALKPHOS 102 10/30/2013 1215   ALKPHOS 118 02/16/2013 0900   AST 26 10/30/2013 1215   AST 29 02/16/2013 0900   ALT 11 10/30/2013 1215   ALT 17 02/16/2013 0900   BILITOT 0.6 10/30/2013 1215   BILITOT 0.88 02/16/2013 0900     ASSESSMENT & PLAN:  #  1 myeloproliferative disorder The patient had bone marrow biopsy done elsewhere. She is doing well with hydroxyurea and aspirin. I have told her to resume taking aspirin now that her recent infection has healed. She will continue on to hydroxyurea per day along with aspirin 81 mg daily. I will see her back in 4 months with repeat blood work history and physical examination.  Orders Placed This Encounter  Procedures  . CBC with Differential    Standing Status: Future     Number of Occurrences:      Standing Expiration Date: 12/10/2014   All questions were answered. The patient knows to call the clinic with any problems, questions or concerns. No barriers to learning was detected. I spent 15 minutes counseling the patient face to face. The total time spent in the appointment was 20 minutes and more than 50% was on counseling and review of test results     [facility], [place], MD 12/10/2013 1:33 PM

## 2013-12-10 NOTE — Telephone Encounter (Signed)
gv adn printed appt sched and avs for pt for June

## 2013-12-10 NOTE — ED Provider Notes (Signed)
CSN: [id number]     Arrival date & time 12/10/13  1452 History  This chart was scribed for Carmin Muskrat, MD by Adriana Reams, ED Scribe. This patient was seen in room MH06/MH06 and the patient's care was started at 1512.    First MD Initiated Contact with Patient 12/10/13 1512     Chief Complaint  Patient presents with  . Hyperglycemia      The history is provided by the patient. No language interpreter was used.   HPI Comments: Amanda [name] is a 73 y.o. female who presents to the Emergency Department complaining of hyperglycemia. She went to her hematologist today who told her hematologist was 676 in her office. When the pt took her sugar after her office visit, she reports her sugar was 571. In triage it was 419. She states that she has been under stress lately and has not been eating and taking her medication regularly. She took her insulin this morning but not her other medication. She reports associated nausea, loss of appetite, and HA (onset 2 days). She denies weakness, difficulty speaking, dysuria, increased frequency, constipation, diarrhea, abdominal pain, CP, blood in stool, decreased urine output or any other symptoms. She reports she recently had an abscess on her buttock that needed to be drained by a general surgeon.    Past Medical History  Diagnosis Date  . Hypertension   . Hyperlipidemia   . Diabetes mellitus 2008  . Depression   . Obesity   . Psoriasis   . Polycythemia     Dr. Elease Hashimoto- [facility]  . Herpes simplex   . Gout   . Hyperglycemia 05/31/2013  . 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 HTN (hypertension) 05/31/2013   Past Surgical History  Procedure Laterality Date  . Breast surgery      milk duct  . Total abdominal hysterectomy w/ bilateral salpingoophorectomy      for heavy periods  . Tonsillectomy     Family History  Problem Relation Age of Onset  . Diabetes Mother   . Heart disease Mother     CAD  . Hyperlipidemia Mother   .  Hypertension Mother   . Kidney disease Mother   . Kidney disease Father   . Cancer Other     breast   History  Substance Use Topics  . Smoking status: Former Smoker -- 34 years    Quit date: 10/19/1991  . Smokeless tobacco: Never Used  . Alcohol Use: No   OB History   Grav Para Term Preterm Abortions TAB SAB Ect Mult Living                 Review of Systems  Constitutional:       Per HPI, otherwise negative  HENT:       Per HPI, otherwise negative  Respiratory:       Per HPI, otherwise negative  Cardiovascular:       Per HPI, otherwise negative  Gastrointestinal: Negative for vomiting.  Endocrine:       Negative aside from HPI  Genitourinary:       Neg aside from HPI   Musculoskeletal:       Per HPI, otherwise negative  Skin: Negative.   Neurological: Negative for syncope.    Allergies  Doxycycline and Latex  Home Medications   Current Outpatient Rx  Name  Route  Sig  Dispense  Refill  . aspirin 81 MG EC tablet   Oral   Take 81 mg by  mouth daily.           . cholecalciferol (VITAMIN D) 1000 UNITS tablet   Oral   Take 1,000 Units by mouth daily.         . hydrochlorothiazide (HYDRODIURIL) 25 MG tablet      Take one tablet every other day for 3 days   3 tablet   0   . hydroxyurea (HYDREA) 500 MG capsule   Oral   Take 1 capsule (500 mg total) by mouth 2 (two) times daily.   60 capsule   6   . Insulin Isophane & Regular (HUMULIN 70/30 KWIKPEN) (70-30) 100 UNIT/ML SUPN   Subcutaneous   Inject 120 Units into the skin daily. Inject under skin 50 units before breakfast, 20 units before lunch and 50 units before dinner. Please inject 30 minutes before the meals.   5 pen   0   . Insulin Pen Needle (RA PEN NEEDLES) 31G X 5 MM MISC      Use 3x a day   200 each   3   . INSULIN SYRINGE 1CC/29G (SAFETY-LOK INSULIN SYR 1CC/29G) 29G X 1/2" 1 ML MISC   Does not apply   55 Units by Does not apply route 2 (two) times daily.   100 each   6   .  lisinopril (PRINIVIL,ZESTRIL) 40 MG tablet   Oral   Take 1 tablet (40 mg total) by mouth daily.   90 tablet   3   . metoprolol (LOPRESSOR) 100 MG tablet   Oral   Take 100 mg by mouth daily.         Marland Kitchen nystatin cream (MYCOSTATIN)   Topical   Apply 1 application topically 2 (two) times daily.   30 g   3   . permethrin (ELIMITE) 5 % cream      Apply to affected area once   60 g   0   . pravastatin (PRAVACHOL) 40 MG tablet   Oral   Take 40 mg by mouth daily.          Triage Vitals; BP 163/72  Pulse 78  Temp(Src) 98.3 F (36.8 C)  Resp 18  SpO2 100%  Physical Exam  Nursing note and vitals reviewed. Constitutional: She is oriented to person, place, and time. She appears well-developed and well-nourished. No distress.  HENT:  Head: Normocephalic and atraumatic.  Eyes: Conjunctivae and EOM are normal.  Cardiovascular: Normal rate and regular rhythm.   Pulmonary/Chest: Effort normal and breath sounds normal. No stridor. No respiratory distress.  Abdominal: Soft. She exhibits no distension.  Musculoskeletal: She exhibits no edema.  Neurological: She is alert and oriented to person, place, and time. No cranial nerve deficit.  Skin: Skin is warm and dry.  Psychiatric: She has a normal mood and affect.    ED Course  Procedures (including critical care time) COORDINATION OF CARE: 3:32 PM Discussed treatment plan which includes IV fluids, urinalysis, comprehensive metabolic panel, CBC with differential, blood gad and CBG level with pt at bedside and pt agreed to plan.   4:43 PM Rechecked pt. Discussed labs. Will order insulin and recheck pt.   Labs Review Labs Reviewed  URINALYSIS, ROUTINE W REFLEX MICROSCOPIC - Abnormal; Notable for the following:    Specific Gravity, Urine 1.034 (*)    Glucose, UA >1000 (*)    All other components within normal limits  COMPREHENSIVE METABOLIC PANEL - Abnormal; Notable for the following:    Sodium 135 (*)  Glucose, Bld 443 (*)     GFR calc non Af Amer 83 (*)    All other components within normal limits  CBC WITH DIFFERENTIAL - Abnormal; Notable for the following:    WBC 17.7 (*)    Hemoglobin 15.9 (*)    Platelets 584 (*)    Neutro Abs 12.9 (*)    Monocytes Absolute 1.1 (*)    All other components within normal limits  CBG MONITORING, ED - Abnormal; Notable for the following:    Glucose-Capillary 419 (*)    All other components within normal limits  I-STAT VENOUS BLOOD GAS, ED - Abnormal; Notable for the following:    pH, Ven 7.370 (*)    Bicarbonate 26.3 (*)    All other components within normal limits  URINE MICROSCOPIC-ADD ON    4:39 PM Patient is in no distress.  Results were reviewed with the patient.  5:56 PM Blood glucose less than 250, no new complaints, no evidence of distress.  MDM    I personally performed the services described in this documentation, which was scribed in my presence. The recorded information has been reviewed and is accurate.   This patient presents with concerns of hyperglycemia.  Patient does have generalized concerns, but no focal pain, disorientation syncope or evidence of distress on exam.  Patient's vital signs are reassuring.  Patient's glucose decreased following provision of saline, insulin.  Patient has a primary care physician within she will followup tomorrow to insure appropriate ongoing care and management.  Carmin Muskrat, MD 12/10/13 1758

## 2013-12-10 NOTE — Telephone Encounter (Signed)
Informed pt of blood sugar 635 on our lab.  She states just now took her insulin.  Did not take it earlier today due to MD visit.  Pt on way home.  Asked her to recheck her blood sugar when she gets home and this RN will call her back at home in about 30 minutes.  She verbalized understanding.   Called pt back and she states her blood sugar is now 541.  She is due to check it again before dinner and cover with insulin again at that time.  Informed pt her blood sugar is much too high and she needs to contact her PCP, Dr. Cruzita Lederer, for management.  Pt says she is going to call her tomorrow for appt..  Encouraged pt to call today and make sure she checks her blood sugar at least 4 times daily.  She verbalized understanding.  Lab results routed to Dr. Cruzita Lederer electronically.

## 2013-12-10 NOTE — Discharge Instructions (Signed)
As discussed, your evaluation today has been largely reassuring.  But, it is important that you monitor your condition carefully, and do not hesitate to return to the ED if you develop new, or concerning changes in your condition.  Otherwise, please follow-up with your physician for appropriate ongoing care.    Hyperglycemia Hyperglycemia occurs when the glucose (sugar) in your blood is too high. Hyperglycemia can happen for many reasons, but it most often happens to people who do not know they have diabetes or are not managing their diabetes properly.  CAUSES  Whether you have diabetes or not, there are other causes of hyperglycemia. Hyperglycemia can occur when you have diabetes, but it can also occur in other situations that you might not be as aware of, such as: Diabetes  If you have diabetes and are having problems controlling your blood glucose, hyperglycemia could occur because of some of the following reasons:  Not following your meal plan.  Not taking your diabetes medications or not taking it properly.  Exercising less or doing less activity than you normally do.  Being sick. Pre-diabetes  This cannot be ignored. Before people develop Type 2 diabetes, they almost always have "pre-diabetes." This is when your blood glucose levels are higher than normal, but not yet high enough to be diagnosed as diabetes. Research has shown that some long-term damage to the body, especially the heart and circulatory system, may already be occurring during pre-diabetes. If you take action to manage your blood glucose when you have pre-diabetes, you may delay or prevent Type 2 diabetes from developing. Stress  If you have diabetes, you may be "diet" controlled or on oral medications or insulin to control your diabetes. However, you may find that your blood glucose is higher than usual in the hospital whether you have diabetes or not. This is often referred to as "stress hyperglycemia." Stress can  elevate your blood glucose. This happens because of hormones put out by the body during times of stress. If stress has been the cause of your high blood glucose, it can be followed regularly by your caregiver. That way he/she can make sure your hyperglycemia does not continue to get worse or progress to diabetes. Steroids  Steroids are medications that act on the infection fighting system (immune system) to block inflammation or infection. One side effect can be a rise in blood glucose. Most people can produce enough extra insulin to allow for this rise, but for those who cannot, steroids make blood glucose levels go even higher. It is not unusual for steroid treatments to "uncover" diabetes that is developing. It is not always possible to determine if the hyperglycemia will go away after the steroids are stopped. A special blood test called an A1c is sometimes done to determine if your blood glucose was elevated before the steroids were started. SYMPTOMS  Thirsty.  Frequent urination.  Dry mouth.  Blurred vision.  Tired or fatigue.  Weakness.  Sleepy.  Tingling in feet or leg. DIAGNOSIS  Diagnosis is made by monitoring blood glucose in one or all of the following ways:  A1c test. This is a chemical found in your blood.  Fingerstick blood glucose monitoring.  Laboratory results. TREATMENT  First, knowing the cause of the hyperglycemia is important before the hyperglycemia can be treated. Treatment may include, but is not be limited to:  Education.  Change or adjustment in medications.  Change or adjustment in meal plan.  Treatment for an illness, infection, etc.  More frequent  blood glucose monitoring.  Change in exercise plan.  Decreasing or stopping steroids.  Lifestyle changes. HOME CARE INSTRUCTIONS   Test your blood glucose as directed.  Exercise regularly. Your caregiver will give you instructions about exercise. Pre-diabetes or diabetes which comes on with  stress is helped by exercising.  Eat wholesome, balanced meals. Eat often and at regular, fixed times. Your caregiver or nutritionist will give you a meal plan to guide your sugar intake.  Being at an ideal weight is important. If needed, losing as little as 10 to 15 pounds may help improve blood glucose levels. SEEK MEDICAL CARE IF:   You have questions about medicine, activity, or diet.  You continue to have symptoms (problems such as increased thirst, urination, or weight gain). SEEK IMMEDIATE MEDICAL CARE IF:   You are vomiting or have diarrhea.  Your breath smells fruity.  You are breathing faster or slower.  You are very sleepy or incoherent.  You have numbness, tingling, or pain in your feet or hands.  You have chest pain.  Your symptoms get worse even though you have been following your caregiver's orders.  If you have any other questions or concerns. Document Released: 03/30/2001 Document Revised: 12/27/2011 Document Reviewed: 01/31/2012 [facility] Patient Information 2014 [place], [place].

## 2013-12-10 NOTE — ED Notes (Signed)
MD at bedside. 

## 2013-12-10 NOTE — ED Notes (Signed)
Pt sts her blood sugar is in the 600s today. Denies associated Sx.

## 2013-12-10 NOTE — Telephone Encounter (Signed)
Left pt.message regarding high BG with advise to be seen in the ED. Pt was reached on second line. Advised to be seen in the eD pt agreed to be seen

## 2013-12-10 NOTE — Telephone Encounter (Signed)
Amanda [name]  Call pt and let her know that her blood sugar is 635 that was ordered by the hematoloigst  Advise to get to ER for IVF"S and treatment

## 2014-01-21 ENCOUNTER — Encounter (HOSPITAL_BASED_OUTPATIENT_CLINIC_OR_DEPARTMENT_OTHER): Payer: Self-pay | Admitting: Emergency Medicine

## 2014-01-21 ENCOUNTER — Emergency Department (HOSPITAL_BASED_OUTPATIENT_CLINIC_OR_DEPARTMENT_OTHER)
Admission: EM | Admit: 2014-01-21 | Discharge: 2014-01-21 | Disposition: A | Discharge status: Home / Self Care | Payer: Medicare HMO | Attending: Emergency Medicine | Admitting: Emergency Medicine

## 2014-01-21 DIAGNOSIS — E785 Hyperlipidemia, unspecified: Secondary | ICD-10-CM | POA: Insufficient documentation

## 2014-01-21 DIAGNOSIS — Z79899 Other long term (current) drug therapy: Secondary | ICD-10-CM | POA: Insufficient documentation

## 2014-01-21 DIAGNOSIS — Z87891 Personal history of nicotine dependence: Secondary | ICD-10-CM | POA: Insufficient documentation

## 2014-01-21 DIAGNOSIS — Z91040 Latex allergy status: Secondary | ICD-10-CM | POA: Insufficient documentation

## 2014-01-21 DIAGNOSIS — E119 Type 2 diabetes mellitus without complications: Secondary | ICD-10-CM | POA: Insufficient documentation

## 2014-01-21 DIAGNOSIS — E669 Obesity, unspecified: Secondary | ICD-10-CM | POA: Insufficient documentation

## 2014-01-21 DIAGNOSIS — Z872 Personal history of diseases of the skin and subcutaneous tissue: Secondary | ICD-10-CM | POA: Insufficient documentation

## 2014-01-21 DIAGNOSIS — Z862 Personal history of diseases of the blood and blood-forming organs and certain disorders involving the immune mechanism: Secondary | ICD-10-CM | POA: Insufficient documentation

## 2014-01-21 DIAGNOSIS — Z8619 Personal history of other infectious and parasitic diseases: Secondary | ICD-10-CM | POA: Insufficient documentation

## 2014-01-21 DIAGNOSIS — K6289 Other specified diseases of anus and rectum: Secondary | ICD-10-CM | POA: Insufficient documentation

## 2014-01-21 DIAGNOSIS — I10 Essential (primary) hypertension: Secondary | ICD-10-CM | POA: Insufficient documentation

## 2014-01-21 DIAGNOSIS — Z8659 Personal history of other mental and behavioral disorders: Secondary | ICD-10-CM | POA: Insufficient documentation

## 2014-01-21 DIAGNOSIS — Z7982 Long term (current) use of aspirin: Secondary | ICD-10-CM | POA: Insufficient documentation

## 2014-01-21 DIAGNOSIS — Z794 Long term (current) use of insulin: Secondary | ICD-10-CM | POA: Insufficient documentation

## 2014-01-21 MED ORDER — HYDROCODONE-ACETAMINOPHEN 5-325 MG PO TABS: 2.0000 | ORAL_TABLET | ORAL | Status: DC | PRN

## 2014-01-21 MED ORDER — HYDROCORTISONE 2.5 % EX CREA: TOPICAL_CREAM | Freq: Two times a day (BID) | CUTANEOUS | Status: DC

## 2014-01-21 NOTE — ED Notes (Signed)
Rectal bleeding x 3 days

## 2014-01-21 NOTE — Discharge Instructions (Signed)
Apply hydrocortisone cream twice daily as prescribed.  Hydrocodone as needed for pain.  Followup with your surgeon at [facility] not improving in the next several days.   Proctitis Proctitis is the swelling and soreness (inflammation) of the lining of the rectum. The rectum is at the end of the large intestine and is attached to the anus. The inflammation causes pain and discomfort. It may be short-term (acute) or long-lasting (chronic). CAUSES Inflammation in the rectum can be caused by many things, such as:  Sexually transmitted diseases (STDs).  Infection.  Anal-rectal trauma or injury.  Ulcerative colitis or Crohn's disease.  Radiation therapy directed near the rectum.  Antibiotic therapy. SYMPTOMS  Sudden, uncomfortable, and frequent urge to have a bowel movement.  Anal or rectal pain.  Abdominal cramping or pain.  Sensation that the rectum is full.  Rectal bleeding.  Pus or mucus discharge from anus.  Diarrhea or frequent soft, loose stools. DIAGNOSIS Diagnosis may include the following:  A history and physical exam.  An STD test.  Blood tests.  Stool tests.  Rectal culture.  A procedure to evaluate the anal canal (anoscopy).  Procedures to look at part, or the entire large bowel (sigmoidoscopy, colonoscopy). TREATMENT Treatment of proctitis depends on the cause. Reducing the symptoms of inflammation and eliminating infection are the main goals of treatment. Treatment may include:  Home remedies and lifestyle, such as sitz baths and avoiding food right before bedtime.  Topical ointments, foams, suppositories, or enemas, such as corticosteroids or anti-inflammatories.  Antibiotic or antiviral medicines to treat infection or to control harmful bacteria.  Medicines to control diarrhea, soften stools, and reduce pain.  Medicines to suppress the immune system.  Avoiding the activity that caused rectal trauma.  Nutritional, dietary, or herbal  supplements.  Heat or laser therapy for persistent bleeding.  A dilation procedure to enlarge a narrowed rectum.  Surgery, though rare, may be necessary to repair damaged rectal lining. HOME CARE INSTRUCTIONS Only take medicines that are recommended or approved by your caregiver.Do not take anti-diarrhea medicine without your caregiver's approval. SEEK MEDICAL CARE IF:  You often experience one or more of the symptoms noted above.  You keep experiencing symptoms after treatment.  You have questions or concerns about your symptoms or treatment plan. MAKE SURE YOU:  Understand these instructions.  Will watch your condition.  Will get help right away if you are not doing well or get worse. [place] of Diabetes and Digestive and Kidney Disease (NIDDK): [email or web address]://gonzalez-best.[email or web address] Document Released: 09/23/2011 Document Revised: 01/29/2013 Document Reviewed: 09/23/2011 [facility] Patient Information 2014 [place], [place].

## 2014-01-21 NOTE — ED Provider Notes (Signed)
CSN: [id number]     Arrival date & time 01/21/14  1739 History  This chart was scribed for Veryl Speak, MD by Celesta Gentile, ED Scribe. The patient was seen in room MH08/MH08. Patient's care was started at 6:13 PM.    Chief Complaint  Patient presents with  . Rectal Bleeding   The history is provided by the patient. No language interpreter was used.   HPI Comments: Amanda [name] is a 73 y.o. female who presents to the Emergency Department complaining of rectal bleeding that started about 3 days ago.  Pt states she has had a boyle on her bottom that has been lanced about 3 times and now she has multiple sores.  She states she was informed to use baby products to help with the healing and comfort.  Pt states her bowel movements change frequently.  She states sometimes she has watery diarrhea and the next day she will be constipated.  Pt denies blood in her stools and urine.  She complains of the area burning and being irritated for about 1 week.  Pt states she has applied Neosporin, vaginal cream, baby ointments, and other OTC medications without relief.  She states after her showers she doesn't dry the area to keep it moist and clean.  Pt reports she has been taking 4-5 showers a day.  She reports she hasn't been able to eat much in the past couple of days.  Pt denies abdominal pain, fevers, and emesis.     Past Medical History  Diagnosis Date  . Hypertension   . Hyperlipidemia   . Diabetes mellitus 2008  . Depression   . Obesity   . Psoriasis   . Polycythemia     Dr. Elease Hashimoto- [facility]  . Herpes simplex   . Gout   . Hyperglycemia 05/31/2013  . 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 HTN (hypertension) 05/31/2013   Past Surgical History  Procedure Laterality Date  . Breast surgery      milk duct  . Total abdominal hysterectomy w/ bilateral salpingoophorectomy      for heavy periods  . Tonsillectomy     Family History  Problem Relation Age of Onset  . Diabetes Mother   . Heart  disease Mother     CAD  . Hyperlipidemia Mother   . Hypertension Mother   . Kidney disease Mother   . Kidney disease Father   . Cancer Other     breast   History  Substance Use Topics  . Smoking status: Former Smoker -- 58 years    Quit date: 10/19/1991  . Smokeless tobacco: Never Used  . Alcohol Use: No   OB History   Grav Para Term Preterm Abortions TAB SAB Ect Mult Living                 Review of Systems  A complete 10 system review of systems was obtained and all systems are negative except as noted in the HPI and PMH.   Allergies  Doxycycline and Latex  Home Medications   Current Outpatient Rx  Name  Route  Sig  Dispense  Refill  . aspirin 81 MG EC tablet   Oral   Take 81 mg by mouth daily.           . cholecalciferol (VITAMIN D) 1000 UNITS tablet   Oral   Take 1,000 Units by mouth daily.         . hydrochlorothiazide (HYDRODIURIL) 25 MG tablet  Take one tablet every other day for 3 days   3 tablet   0   . hydroxyurea (HYDREA) 500 MG capsule   Oral   Take 1 capsule (500 mg total) by mouth 2 (two) times daily.   60 capsule   6   . Insulin Isophane & Regular (HUMULIN 70/30 KWIKPEN) (70-30) 100 UNIT/ML SUPN   Subcutaneous   Inject 120 Units into the skin daily. Inject under skin 50 units before breakfast, 20 units before lunch and 50 units before dinner. Please inject 30 minutes before the meals.   5 pen   0   . Insulin Pen Needle (RA PEN NEEDLES) 31G X 5 MM MISC      Use 3x a day   200 each   3   . INSULIN SYRINGE 1CC/29G (SAFETY-LOK INSULIN SYR 1CC/29G) 29G X 1/2" 1 ML MISC   Does not apply   55 Units by Does not apply route 2 (two) times daily.   100 each   6   . lisinopril (PRINIVIL,ZESTRIL) 40 MG tablet   Oral   Take 1 tablet (40 mg total) by mouth daily.   90 tablet   3   . metoprolol (LOPRESSOR) 100 MG tablet   Oral   Take 100 mg by mouth daily.         Marland Kitchen nystatin cream (MYCOSTATIN)   Topical   Apply 1 application  topically 2 (two) times daily.   30 g   3   . permethrin (ELIMITE) 5 % cream      Apply to affected area once   60 g   0   . pravastatin (PRAVACHOL) 40 MG tablet   Oral   Take 40 mg by mouth daily.          Triage Vitals: BP 182/110  Pulse 108  Temp(Src) 98.4 F (36.9 C) (Oral)  Resp 20  Ht 5' 4"  (1.626 m)  Wt 191 lb (86.637 kg)  BMI 32.77 kg/m2  SpO2 97%  Physical Exam  Nursing note and vitals reviewed. Constitutional: She is oriented to person, place, and time. She appears well-developed and well-nourished. No distress.  HENT:  Head: Normocephalic and atraumatic.  Eyes: Conjunctivae and EOM are normal. Right eye exhibits no discharge. Left eye exhibits no discharge.  Neck: Neck supple. No tracheal deviation present.  Cardiovascular: Normal rate.   Pulmonary/Chest: Effort normal. No respiratory distress.  Abdominal: Soft. She exhibits no distension and no mass. There is no tenderness. There is no rebound and no guarding.  Genitourinary: Pelvic exam was performed with patient in the knee-chest position.  The tissue surrounding the rectum is erythematous, inflamed, and broken down.  There is no definite abscess or hemorrhoid.    Musculoskeletal: Normal range of motion.  Neurological: She is alert and oriented to person, place, and time.  Skin: Skin is warm and dry.  Psychiatric: She has a normal mood and affect. Her behavior is normal. Judgment and thought content normal.    ED Course  Procedures (including critical care time) DIAGNOSTIC STUDIES: Oxygen Saturation is 97% on RA, normal by my interpretation.    COORDINATION OF CARE: 6:26 PM-Informed pt to attempt to keep area dry.  Will discharge with hydrocortisone cream and hydrocodone.  Patient informed of current plan of treatment and evaluation and agrees with plan.     MDM   Final diagnoses:  None    Patient presents with burning and irritation to her perianal region. This is been going  on for quite some  time. She has a history of abscess. She denies to me that she is having blood in her stool. She is noted to have an extensive area of excoriation and redness and skin breakdown. This will be treated with hydrocortisone cream and the patient will be advised to keep the area as dry as possible between applications. She understands to followup with her surgeon if symptoms are not improving in the near future.   I personally performed the services described in this documentation, which was scribed in my presence. The recorded information has been reviewed and is accurate.      Veryl Speak, MD 01/21/14 [phone]

## 2014-01-28 ENCOUNTER — Encounter (HOSPITAL_COMMUNITY): Payer: Self-pay | Admitting: Emergency Medicine

## 2014-01-28 ENCOUNTER — Emergency Department (HOSPITAL_COMMUNITY): Payer: Medicare HMO

## 2014-01-28 ENCOUNTER — Telehealth: Payer: Self-pay | Admitting: Internal Medicine

## 2014-01-28 ENCOUNTER — Emergency Department (HOSPITAL_COMMUNITY)
Admission: EM | Admit: 2014-01-28 | Discharge: 2014-01-28 | Disposition: A | Discharge status: Home / Self Care | Payer: Medicare HMO | Source: Home / Self Care | Attending: Emergency Medicine | Admitting: Emergency Medicine

## 2014-01-28 DIAGNOSIS — R112 Nausea with vomiting, unspecified: Secondary | ICD-10-CM | POA: Insufficient documentation

## 2014-01-28 DIAGNOSIS — Z9079 Acquired absence of other genital organ(s): Secondary | ICD-10-CM | POA: Insufficient documentation

## 2014-01-28 DIAGNOSIS — R1084 Generalized abdominal pain: Secondary | ICD-10-CM | POA: Insufficient documentation

## 2014-01-28 DIAGNOSIS — R197 Diarrhea, unspecified: Secondary | ICD-10-CM | POA: Insufficient documentation

## 2014-01-28 DIAGNOSIS — Z91040 Latex allergy status: Secondary | ICD-10-CM | POA: Insufficient documentation

## 2014-01-28 DIAGNOSIS — I10 Essential (primary) hypertension: Secondary | ICD-10-CM | POA: Insufficient documentation

## 2014-01-28 DIAGNOSIS — D45 Polycythemia vera: Secondary | ICD-10-CM | POA: Insufficient documentation

## 2014-01-28 DIAGNOSIS — E119 Type 2 diabetes mellitus without complications: Secondary | ICD-10-CM | POA: Insufficient documentation

## 2014-01-28 DIAGNOSIS — R1031 Right lower quadrant pain: Secondary | ICD-10-CM | POA: Insufficient documentation

## 2014-01-28 DIAGNOSIS — L408 Other psoriasis: Secondary | ICD-10-CM | POA: Insufficient documentation

## 2014-01-28 DIAGNOSIS — A419 Sepsis, unspecified organism: Secondary | ICD-10-CM | POA: Diagnosis not present

## 2014-01-28 DIAGNOSIS — Z8659 Personal history of other mental and behavioral disorders: Secondary | ICD-10-CM | POA: Insufficient documentation

## 2014-01-28 DIAGNOSIS — Z794 Long term (current) use of insulin: Secondary | ICD-10-CM | POA: Insufficient documentation

## 2014-01-28 DIAGNOSIS — Z90710 Acquired absence of both cervix and uterus: Secondary | ICD-10-CM | POA: Insufficient documentation

## 2014-01-28 DIAGNOSIS — Z79899 Other long term (current) drug therapy: Secondary | ICD-10-CM | POA: Insufficient documentation

## 2014-01-28 DIAGNOSIS — D751 Secondary polycythemia: Secondary | ICD-10-CM

## 2014-01-28 DIAGNOSIS — Z87891 Personal history of nicotine dependence: Secondary | ICD-10-CM | POA: Insufficient documentation

## 2014-01-28 DIAGNOSIS — E669 Obesity, unspecified: Secondary | ICD-10-CM | POA: Insufficient documentation

## 2014-01-28 DIAGNOSIS — IMO0002 Reserved for concepts with insufficient information to code with codable children: Secondary | ICD-10-CM | POA: Insufficient documentation

## 2014-01-28 DIAGNOSIS — Z8619 Personal history of other infectious and parasitic diseases: Secondary | ICD-10-CM | POA: Insufficient documentation

## 2014-01-28 LAB — URINE MICROSCOPIC-ADD ON

## 2014-01-28 LAB — CBC WITH DIFFERENTIAL/PLATELET
Basophils Absolute: 0 10*3/uL (ref 0.0–0.1)
Basophils Relative: 0 % (ref 0–1)
EOS PCT: 1 % (ref 0–5)
Eosinophils Absolute: 0.2 10*3/uL (ref 0.0–0.7)
HCT: 45 % (ref 36.0–46.0)
HEMOGLOBIN: 15.8 g/dL — AB (ref 12.0–15.0)
Lymphocytes Relative: 6 % — ABNORMAL LOW (ref 12–46)
Lymphs Abs: 1.1 10*3/uL (ref 0.7–4.0)
MCH: 31.3 pg (ref 26.0–34.0)
MCHC: 35.1 g/dL (ref 30.0–36.0)
MCV: 89.3 fL (ref 78.0–100.0)
MONOS PCT: 6 % (ref 3–12)
Monocytes Absolute: 1.1 10*3/uL — ABNORMAL HIGH (ref 0.1–1.0)
NEUTROS PCT: 87 % — AB (ref 43–77)
Neutro Abs: 15.6 10*3/uL — ABNORMAL HIGH (ref 1.7–7.7)
PLATELETS: 629 10*3/uL — AB (ref 150–400)
RBC: 5.04 MIL/uL (ref 3.87–5.11)
RDW: 13.1 % (ref 11.5–15.5)
WBC: 18 10*3/uL — AB (ref 4.0–10.5)

## 2014-01-28 LAB — LIPASE, BLOOD: Lipase: 24 U/L (ref 11–59)

## 2014-01-28 LAB — COMPREHENSIVE METABOLIC PANEL
ALBUMIN: 3.7 g/dL (ref 3.5–5.2)
ALK PHOS: 121 U/L — AB (ref 39–117)
ALT: 12 U/L (ref 0–35)
AST: 19 U/L (ref 0–37)
BILIRUBIN TOTAL: 1 mg/dL (ref 0.3–1.2)
BUN: 6 mg/dL (ref 6–23)
CHLORIDE: 93 meq/L — AB (ref 96–112)
CO2: 21 mEq/L (ref 19–32)
CREATININE: 0.54 mg/dL (ref 0.50–1.10)
Calcium: 9.5 mg/dL (ref 8.4–10.5)
GFR calc Af Amer: >90 mL/min (ref >90)
GFR calc non Af Amer: >90 mL/min (ref >90)
Glucose, Bld: 416 mg/dL — ABNORMAL HIGH (ref 70–99)
POTASSIUM: 4 meq/L (ref 3.7–5.3)
Sodium: 131 mEq/L — ABNORMAL LOW (ref 137–147)
Total Protein: 7 g/dL (ref 6.0–8.3)

## 2014-01-28 LAB — URINALYSIS, ROUTINE W REFLEX MICROSCOPIC
Bilirubin Urine: NEGATIVE
Glucose, UA: >1000 mg/dL — AB
Hgb urine dipstick: NEGATIVE
Ketones, ur: NEGATIVE mg/dL
LEUKOCYTES UA: NEGATIVE
Nitrite: NEGATIVE
Protein, ur: NEGATIVE mg/dL
Specific Gravity, Urine: >1.046 — ABNORMAL HIGH (ref 1.005–1.030)
Urobilinogen, UA: 0.2 mg/dL (ref 0.0–1.0)
pH: 5 (ref 5.0–8.0)

## 2014-01-28 MED ORDER — HYDROCODONE-ACETAMINOPHEN 5-325 MG PO TABS: 2.0000 | ORAL_TABLET | ORAL | Status: DC | PRN

## 2014-01-28 MED ORDER — ONDANSETRON HCL 4 MG/2ML IJ SOLN
4.0000 mg | Freq: Once | INTRAMUSCULAR | Status: AC
  Administered 2014-01-28: 4 mg via INTRAVENOUS
  Filled 2014-01-28: qty 2

## 2014-01-28 MED ORDER — SODIUM CHLORIDE 0.9 % IV BOLUS (SEPSIS)
500.0000 mL | Freq: Once | INTRAVENOUS | Status: AC
  Administered 2014-01-28: 500 mL via INTRAVENOUS

## 2014-01-28 MED ORDER — SODIUM CHLORIDE 0.9 % IV SOLN
INTRAVENOUS | Status: DC
  Administered 2014-01-28: 16:00:00 via INTRAVENOUS

## 2014-01-28 MED ORDER — ONDANSETRON HCL 8 MG PO TABS: 8.0000 mg | ORAL_TABLET | Freq: Three times a day (TID) | ORAL | Status: DC | PRN

## 2014-01-28 MED ORDER — IOHEXOL 300 MG/ML  SOLN
100.0000 mL | Freq: Once | INTRAMUSCULAR | Status: AC | PRN
  Administered 2014-01-28: 100 mL via INTRAVENOUS

## 2014-01-28 MED ORDER — IOHEXOL 300 MG/ML  SOLN
50.0000 mL | Freq: Once | INTRAMUSCULAR | Status: AC | PRN
  Administered 2014-01-28: 50 mL via ORAL

## 2014-01-28 NOTE — Telephone Encounter (Signed)
Will be out of insulin by tomorrow can we get her some samples to hold her until her next paycheck in 2.5 wks.

## 2014-01-28 NOTE — ED Notes (Signed)
Pt aware of need of urine sample. Unable to void at this time

## 2014-01-28 NOTE — ED Notes (Signed)
Patient will call when she has to urinate

## 2014-01-28 NOTE — Discharge Instructions (Signed)
Nausea and Vomiting Nausea means you feel sick to your stomach. Throwing up (vomiting) is a reflex where stomach contents come out of your mouth. HOME CARE   Take medicine as told by your doctor.  Do not force yourself to eat. However, you do need to drink fluids.  If you feel like eating, eat a normal diet as told by your doctor.  Eat rice, wheat, potatoes, bread, lean meats, yogurt, fruits, and vegetables.  Avoid high-fat foods.  Drink enough fluids to keep your pee (urine) clear or pale yellow.  Ask your doctor how to replace body fluid losses (rehydrate). Signs of body fluid loss (dehydration) include:  Feeling very thirsty.  Dry lips and mouth.  Feeling dizzy.  Dark pee.  Peeing less than normal.  Feeling confused.  Fast breathing or heart rate. GET HELP RIGHT AWAY IF:   You have blood in your throw up.  You have black or bloody poop (stool).  You have a bad headache or stiff neck.  You feel confused.  You have bad belly (abdominal) pain.  You have chest pain or trouble breathing.  You do not pee at least once every 8 hours.  You have cold, clammy skin.  You keep throwing up after 24 to 48 hours.  You have a fever. MAKE SURE YOU:   Understand these instructions.  Will watch your condition.  Will get help right away if you are not doing well or get worse. Document Released: 03/22/2008 Document Revised: 12/27/2011 Document Reviewed: 03/05/2011 [facility] Patient Information 2014 [place], [place].  Diet for Diarrhea, Adult Frequent, runny stools (diarrhea) may be caused or worsened by food or drink. Diarrhea may be relieved by changing your diet. Since diarrhea can last up to 7 days, it is easy for you to lose too much fluid from the body and become dehydrated. Fluids that are lost need to be replaced. Along with a modified diet, make sure you drink enough fluids to keep your urine clear or pale yellow. DIET INSTRUCTIONS  Ensure adequate fluid intake  (hydration): have 1 cup (8 oz) of fluid for each diarrhea episode. Avoid fluids that contain simple sugars or sports drinks, fruit juices, whole milk products, and sodas. Your urine should be clear or pale yellow if you are drinking enough fluids. Hydrate with an oral rehydration solution that you can purchase at pharmacies, retail stores, and online. You can prepare an oral rehydration solution at home by mixing the following ingredients together:    tsp table salt.   tsp baking soda.   tsp salt substitute containing potassium chloride.  1  tablespoons sugar.  1 L (34 oz) of water.  Certain foods and beverages may increase the speed at which food moves through the gastrointestinal (GI) tract. These foods and beverages should be avoided and include:  Caffeinated and alcoholic beverages.  High-fiber foods, such as raw fruits and vegetables, nuts, seeds, and whole grain breads and cereals.  Foods and beverages sweetened with sugar alcohols, such as xylitol, sorbitol, and mannitol.  Some foods may be well tolerated and may help thicken stool including:  Starchy foods, such as rice, toast, pasta, low-sugar cereal, oatmeal, grits, baked potatoes, crackers, and bagels.   Bananas.   Applesauce.  Add probiotic-rich foods to help increase healthy bacteria in the GI tract, such as yogurt and fermented milk products. RECOMMENDED FOODS AND BEVERAGES Starches Choose foods with less than 2 g of fiber per serving.  Recommended:  White, [place], and pita breads, plain rolls,  buns, bagels. Plain muffins, matzo. Soda, saltine, or graham crackers. Pretzels, melba toast, zwieback. Cooked cereals made with water: cornmeal, farina, cream cereals. Dry cereals: refined corn, wheat, rice. Potatoes prepared any way without skins, refined macaroni, spaghetti, noodles, refined rice.  Avoid:  Bread, rolls, or crackers made with whole wheat, multi-grains, rye, bran seeds, nuts, or coconut. Corn tortillas or  taco shells. Cereals containing whole grains, multi-grains, bran, coconut, nuts, raisins. Cooked or dry oatmeal. Coarse wheat cereals, granola. Cereals advertised as "high-fiber." Potato skins. Whole grain pasta, wild or brown rice. Popcorn. Sweet potatoes, yams. Sweet rolls, doughnuts, waffles, pancakes, sweet breads. Vegetables  Recommended: Strained tomato and vegetable juices. Most well-cooked and canned vegetables without seeds. Fresh: Tender lettuce, cucumber without the skin, cabbage, spinach, bean sprouts.  Avoid: Fresh, cooked, or canned: Artichokes, baked beans, beet greens, broccoli, Brussels sprouts, corn, kale, legumes, peas, sweet potatoes. Cooked: Green or red cabbage, spinach. Avoid large servings of any vegetables because vegetables shrink when cooked, and they contain more fiber per serving than fresh vegetables. Fruit  Recommended: Cooked or canned: Apricots, applesauce, cantaloupe, cherries, fruit cocktail, grapefruit, grapes, kiwi, mandarin oranges, peaches, pears, plums, watermelon. Fresh: Apples without skin, ripe banana, grapes, cantaloupe, cherries, grapefruit, peaches, oranges, plums. Keep servings limited to  cup or 1 piece.  Avoid: Fresh: Apples with skin, apricots, mangoes, pears, raspberries, strawberries. Prune juice, stewed or dried prunes. Dried fruits, raisins, dates. Large servings of all fresh fruits. Protein  Recommended: Ground or well-cooked tender beef, ham, veal, lamb, pork, or poultry. Eggs. Fish, oysters, shrimp, lobster, other seafoods. Liver, organ meats.  Avoid: Tough, fibrous meats with gristle. Peanut butter, smooth or chunky. Cheese, nuts, seeds, legumes, dried peas, beans, lentils. Dairy  Recommended: Yogurt, lactose-free milk, kefir, drinkable yogurt, buttermilk, soy milk, or plain hard cheese.  Avoid: Milk, chocolate milk, beverages made with milk, such as milkshakes. Soups  Recommended: Bouillon, broth, or soups made from allowed foods. Any  strained soup.  Avoid: Soups made from vegetables that are not allowed, cream or milk-based soups. Desserts and Sweets  Recommended: Sugar-free gelatin, sugar-free frozen ice pops made without sugar alcohol.  Avoid: Plain cakes and cookies, pie made with fruit, pudding, custard, cream pie. Gelatin, fruit, ice, sherbet, frozen ice pops. Ice cream, ice milk without nuts. Plain hard candy, honey, jelly, molasses, syrup, sugar, chocolate syrup, gumdrops, marshmallows. Fats and Oils  Recommended: Limit fats to less than 8 tsp per day.  Avoid: Seeds, nuts, olives, avocados. Margarine, butter, cream, mayonnaise, salad oils, plain salad dressings. Plain gravy, crisp bacon without rind. Beverages  Recommended: Water, decaffeinated teas, oral rehydration solutions, sugar-free beverages not sweetened with sugar alcohols.  Avoid: Fruit juices, caffeinated beverages (coffee, tea, soda), alcohol, sports drinks, or lemon-lime soda. Condiments  Recommended: Ketchup, mustard, horseradish, vinegar, cocoa powder. Spices in moderation: allspice, basil, bay leaves, celery powder or leaves, cinnamon, cumin powder, curry powder, ginger, mace, marjoram, onion or garlic powder, oregano, paprika, parsley flakes, ground pepper, rosemary, sage, savory, tarragon, thyme, turmeric.  Avoid: Coconut, honey. Document Released: 12/25/2003 Document Revised: 06/28/2012 Document Reviewed: 02/18/2012 [facility] Patient Information 2014 [place].

## 2014-01-28 NOTE — ED Notes (Signed)
Patient reports that she has had vomiting and diarrhea x 3 days and soreness of the left abdomen from vomiting numerous times. Patient denies any blood in the vomit or stool.

## 2014-01-28 NOTE — ED Provider Notes (Signed)
CSN: [id number]     Arrival date & time 01/28/14  1239 History   First MD Initiated Contact with Patient 01/28/14 1414     Chief Complaint  Patient presents with  . Nausea  . Diarrhea  . Abdominal Pain     (Consider location/radiation/quality/duration/timing/severity/associated sxs/prior Treatment) HPI  Amanda [name] is a 73 y.o. female who presents for evaluation of vomiting, and diarrhea, and left lower abdominal pain, for 3 days, after eating a "fresh fruit cup from [employer]". Her husband sampled the throat and felt that it tasted bad. She has had fever and chills, but not taken her temperature. She denies chest pain, weakness, dizziness, cough, or paresthesias. There are no other known modifying factors.  Past Medical History  Diagnosis Date  . Hypertension   . Hyperlipidemia   . Diabetes mellitus 2008  . Depression   . Obesity   . Psoriasis   . Polycythemia     Dr. Elease Hashimoto- [facility]  . Herpes simplex   . Gout   . Hyperglycemia 05/31/2013  . HTN (hypertension) 05/31/2013   Past Surgical History  Procedure Laterality Date  . Breast surgery      milk duct  . Total abdominal hysterectomy w/ bilateral salpingoophorectomy      for heavy periods  . Tonsillectomy     Family History  Problem Relation Age of Onset  . Diabetes Mother   . Heart disease Mother     CAD  . Hyperlipidemia Mother   . Hypertension Mother   . Kidney disease Mother   . Kidney disease Father   . Cancer Other     breast   History  Substance Use Topics  . Smoking status: Former Smoker -- 35 years    Quit date: 10/19/1991  . Smokeless tobacco: Never Used  . Alcohol Use: No   OB History   Grav Para Term Preterm Abortions TAB SAB Ect Mult Living                 Review of Systems  All other systems reviewed and are negative.     Allergies  Tape; Doxycycline; and Latex  Home Medications   Current Outpatient Rx  Name  Route  Sig  Dispense  Refill  . HYDROcodone-acetaminophen  (NORCO) 5-325 MG per tablet   Oral   Take 2 tablets by mouth every 4 (four) hours as needed.   15 tablet   0   . hydrocortisone 2.5 % cream   Topical   Apply 1 application topically 2 (two) times daily.         . hydroxyurea (HYDREA) 500 MG capsule   Oral   Take 1 capsule (500 mg total) by mouth 2 (two) times daily.   60 capsule   6   . Insulin Isophane & Regular Human (HUMULIN 70/30 KWIKPEN) (70-30) 100 UNIT/ML PEN   Subcutaneous   Inject 20-65 Units into the skin 3 (three) times daily after meals. 65 units at breakfast, 20 units at lunch, and 65 units at dinner         . lisinopril (PRINIVIL,ZESTRIL) 40 MG tablet   Oral   Take 1 tablet (40 mg total) by mouth daily.   90 tablet   3   . nystatin cream (MYCOSTATIN)   Topical   Apply 1 application topically 2 (two) times daily.         Marland Kitchen HYDROcodone-acetaminophen (NORCO) 5-325 MG per tablet   Oral   Take 2 tablets by mouth  every 4 (four) hours as needed.   20 tablet   0   . ondansetron (ZOFRAN) 8 MG tablet   Oral   Take 1 tablet (8 mg total) by mouth every 8 (eight) hours as needed for nausea or vomiting.   20 tablet   0    BP 157/87  Pulse 105  Temp(Src) 99.6 F (37.6 C) (Oral)  Resp 16  SpO2 93% Physical Exam  Nursing note and vitals reviewed. Constitutional: She is oriented to person, place, and time. She appears well-developed.  Elderly, frail  HENT:  Head: Normocephalic and atraumatic.  Eyes: Conjunctivae and EOM are normal. Pupils are equal, round, and reactive to light.  Neck: Normal range of motion and phonation normal. Neck supple.  Cardiovascular: Normal rate, regular rhythm and intact distal pulses.   Pulmonary/Chest: Effort normal and breath sounds normal. She exhibits no tenderness.  Abdominal: Soft. She exhibits no distension and no mass. There is tenderness (Mild diffuse). There is no rebound and no guarding.  Musculoskeletal: Normal range of motion.  Neurological: She is alert and  oriented to person, place, and time. She exhibits normal muscle tone.  Skin: Skin is warm and dry.  Psychiatric: She has a normal mood and affect. Her behavior is normal. Judgment and thought content normal.    ED Course  Procedures (including critical care time)  Medications  0.9 %  sodium chloride infusion ( Intravenous New Bag/Given 01/28/14 1549)  ondansetron (ZOFRAN) injection 4 mg (4 mg Intravenous Given 01/28/14 1448)  sodium chloride 0.9 % bolus 500 mL (0 mLs Intravenous Stopped 01/28/14 1548)  iohexol (OMNIPAQUE) 300 MG/ML solution 50 mL (50 mLs Oral Contrast Given 01/28/14 1447)  iohexol (OMNIPAQUE) 300 MG/ML solution 100 mL (100 mLs Intravenous Contrast Given 01/28/14 1625)    Patient Vitals for the past 24 hrs:  BP Temp Temp src Pulse Resp SpO2  01/28/14 1247 157/87 mmHg 99.6 F (37.6 C) Oral 105 16 93 %    5:04 PM Reevaluation with update and discussion. After initial assessment and treatment, an updated evaluation reveals no additional c/o. She tolerated the oral contrast, without vomiting. I discussed the CT findings with her, and she remembers that she had this previously, and was subsequently worked up with a bone marrow biopsy. She has up with her PCP, in the morning for a checkup. Findings discussed with patient and husband, all questions answered. [place] Review Labs Reviewed  CBC WITH DIFFERENTIAL - Abnormal; Notable for the following:    WBC 18.0 (*)    Hemoglobin 15.8 (*)    Platelets 629 (*)    Neutrophils Relative % 87 (*)    Lymphocytes Relative 6 (*)    Neutro Abs 15.6 (*)    Monocytes Absolute 1.1 (*)    All other components within normal limits  COMPREHENSIVE METABOLIC PANEL - Abnormal; Notable for the following:    Sodium 131 (*)    Chloride 93 (*)    Glucose, Bld 416 (*)    Alkaline Phosphatase 121 (*)    All other components within normal limits  LIPASE, BLOOD  URINALYSIS, ROUTINE W REFLEX MICROSCOPIC   Imaging Review Ct Abdomen  Pelvis W Contrast  01/28/2014   CLINICAL DATA:  Nausea and vomiting  EXAM: CT ABDOMEN AND PELVIS WITH CONTRAST  TECHNIQUE: Multidetector CT imaging of the abdomen and pelvis was performed using the standard protocol following bolus administration of intravenous contrast.  CONTRAST:  170m OMNIPAQUE IOHEXOL 300 MG/ML  SOLN  COMPARISON:  CT PELVIS W/CM dated 09/14/2013  FINDINGS: The contour of the liver is nodular. The left lobe is hypertrophied. These findings are compatible with cirrhotic change. There is low-density throughout the liver compatible with coexisting diffuse hepatic steatosis.  Splenomegaly. Multiple hypodensities are scattered in the upper spleen. They are present at the inferior tip of the spleen of unknown significance.  Dependent atelectasis at the lung bases.  The pancreas, adrenal glands are within normal limits. Gallbladder is somewhat distended without obvious inflammatory change  Kidneys are within normal limits.  Small and large bowel are both decompressed.  Advanced atherosclerotic change of the aorta and iliac vasculature.  Bladder is mildly distended  Uterus and adnexa are within normal limits.  Degenerative changes in the lower lumbar spine.  No free-fluid. No abnormal retroperitoneal adenopathy. Small para-aortic nodes are noted. Small portacaval node. Small gastrohepatic ligament nodes.  IMPRESSION: Cirrhotic liver.  Nonspecific hypodensities throughout the spleen. These are nonspecific and may represent represent infarcts or an inflammatory process.   Electronically Signed   By: Maryclare Bean M.D.   On: 01/28/2014 16:56     EKG Interpretation None      MDM   Final diagnoses:  Nausea vomiting and diarrhea  Polycythemia    Nonspecific, vomiting, and diarrhea with a history consistent with food poisoning. She is improved after treatment, stable for discharge. She has elevation of the lines, consistent with her chronic polycythemia, which is currently under treatment and  management by her hematologist. Doubt colitis, impending vascular collapse, acute, hematological, urgency, or need for additional treatment in the emergency department or inpatient setting.   Nursing Notes Reviewed/ Care Coordinated Applicable Imaging Reviewed Interpretation of Laboratory Data incorporated into ED treatment  The patient appears reasonably screened and/or stabilized for discharge and I doubt any other medical condition or other [facility] requiring further screening, evaluation, or treatment in the ED at this time prior to discharge.  Plan: Home Medications- Zofran, Norco; Home Treatments- Rest, gradually advance diet; return here if the recommended treatment, does not improve the symptoms; Recommended follow up- PCP in AM as scheduled.    Richarda Blade, MD 01/28/14 [phone]

## 2014-01-29 ENCOUNTER — Ambulatory Visit (INDEPENDENT_AMBULATORY_CARE_PROVIDER_SITE_OTHER): Payer: Medicare HMO | Admitting: Internal Medicine

## 2014-01-29 ENCOUNTER — Encounter: Payer: Self-pay | Admitting: Internal Medicine

## 2014-01-29 ENCOUNTER — Emergency Department (HOSPITAL_BASED_OUTPATIENT_CLINIC_OR_DEPARTMENT_OTHER): Payer: Medicare HMO

## 2014-01-29 ENCOUNTER — Inpatient Hospital Stay (HOSPITAL_COMMUNITY): Payer: Medicare HMO

## 2014-01-29 ENCOUNTER — Inpatient Hospital Stay (HOSPITAL_BASED_OUTPATIENT_CLINIC_OR_DEPARTMENT_OTHER)
Admission: AD | Admit: 2014-01-29 | Discharge: 2014-02-02 | DRG: 872 | Disposition: A | Discharge status: Home / Self Care | Payer: Medicare HMO | Attending: Internal Medicine | Admitting: Internal Medicine

## 2014-01-29 ENCOUNTER — Encounter (HOSPITAL_BASED_OUTPATIENT_CLINIC_OR_DEPARTMENT_OTHER): Payer: Self-pay | Admitting: Emergency Medicine

## 2014-01-29 VITALS — BP 150/79 | HR 86 | Temp 98.0°F | Resp 18 | Wt 192.0 lb

## 2014-01-29 DIAGNOSIS — E669 Obesity, unspecified: Secondary | ICD-10-CM | POA: Diagnosis present

## 2014-01-29 DIAGNOSIS — Z8249 Family history of ischemic heart disease and other diseases of the circulatory system: Secondary | ICD-10-CM

## 2014-01-29 DIAGNOSIS — R112 Nausea with vomiting, unspecified: Secondary | ICD-10-CM | POA: Diagnosis present

## 2014-01-29 DIAGNOSIS — M109 Gout, unspecified: Secondary | ICD-10-CM | POA: Diagnosis present

## 2014-01-29 DIAGNOSIS — R55 Syncope and collapse: Secondary | ICD-10-CM

## 2014-01-29 DIAGNOSIS — K529 Noninfective gastroenteritis and colitis, unspecified: Secondary | ICD-10-CM | POA: Insufficient documentation

## 2014-01-29 DIAGNOSIS — L02419 Cutaneous abscess of limb, unspecified: Secondary | ICD-10-CM

## 2014-01-29 DIAGNOSIS — IMO0001 Reserved for inherently not codable concepts without codable children: Secondary | ICD-10-CM | POA: Diagnosis present

## 2014-01-29 DIAGNOSIS — I519 Heart disease, unspecified: Secondary | ICD-10-CM

## 2014-01-29 DIAGNOSIS — L03317 Cellulitis of buttock: Secondary | ICD-10-CM

## 2014-01-29 DIAGNOSIS — R197 Diarrhea, unspecified: Secondary | ICD-10-CM | POA: Diagnosis present

## 2014-01-29 DIAGNOSIS — I10 Essential (primary) hypertension: Secondary | ICD-10-CM

## 2014-01-29 DIAGNOSIS — L988 Other specified disorders of the skin and subcutaneous tissue: Secondary | ICD-10-CM | POA: Diagnosis present

## 2014-01-29 DIAGNOSIS — D72829 Elevated white blood cell count, unspecified: Secondary | ICD-10-CM

## 2014-01-29 DIAGNOSIS — R0602 Shortness of breath: Secondary | ICD-10-CM

## 2014-01-29 DIAGNOSIS — L0291 Cutaneous abscess, unspecified: Secondary | ICD-10-CM

## 2014-01-29 DIAGNOSIS — K7460 Unspecified cirrhosis of liver: Secondary | ICD-10-CM | POA: Diagnosis present

## 2014-01-29 DIAGNOSIS — R1110 Vomiting, unspecified: Secondary | ICD-10-CM

## 2014-01-29 DIAGNOSIS — Z6832 Body mass index (BMI) 32.0-32.9, adult: Secondary | ICD-10-CM

## 2014-01-29 DIAGNOSIS — R42 Dizziness and giddiness: Secondary | ICD-10-CM | POA: Diagnosis present

## 2014-01-29 DIAGNOSIS — E785 Hyperlipidemia, unspecified: Secondary | ICD-10-CM | POA: Diagnosis present

## 2014-01-29 DIAGNOSIS — Z91199 Patient's noncompliance with other medical treatment and regimen due to unspecified reason: Secondary | ICD-10-CM | POA: Diagnosis not present

## 2014-01-29 DIAGNOSIS — D47Z9 Other specified neoplasms of uncertain behavior of lymphoid, hematopoietic and related tissue: Secondary | ICD-10-CM | POA: Diagnosis present

## 2014-01-29 DIAGNOSIS — J811 Chronic pulmonary edema: Secondary | ICD-10-CM | POA: Diagnosis present

## 2014-01-29 DIAGNOSIS — B9789 Other viral agents as the cause of diseases classified elsewhere: Secondary | ICD-10-CM | POA: Diagnosis present

## 2014-01-29 DIAGNOSIS — E860 Dehydration: Secondary | ICD-10-CM

## 2014-01-29 DIAGNOSIS — E1165 Type 2 diabetes mellitus with hyperglycemia: Secondary | ICD-10-CM

## 2014-01-29 DIAGNOSIS — L408 Other psoriasis: Secondary | ICD-10-CM

## 2014-01-29 DIAGNOSIS — Z87891 Personal history of nicotine dependence: Secondary | ICD-10-CM

## 2014-01-29 DIAGNOSIS — E119 Type 2 diabetes mellitus without complications: Secondary | ICD-10-CM

## 2014-01-29 DIAGNOSIS — F329 Major depressive disorder, single episode, unspecified: Secondary | ICD-10-CM

## 2014-01-29 DIAGNOSIS — A419 Sepsis, unspecified organism: Secondary | ICD-10-CM | POA: Diagnosis present

## 2014-01-29 DIAGNOSIS — K5900 Constipation, unspecified: Secondary | ICD-10-CM

## 2014-01-29 DIAGNOSIS — D751 Secondary polycythemia: Secondary | ICD-10-CM

## 2014-01-29 DIAGNOSIS — B0089 Other herpesviral infection: Secondary | ICD-10-CM

## 2014-01-29 DIAGNOSIS — D45 Polycythemia vera: Secondary | ICD-10-CM | POA: Diagnosis present

## 2014-01-29 DIAGNOSIS — Z90710 Acquired absence of both cervix and uterus: Secondary | ICD-10-CM | POA: Diagnosis not present

## 2014-01-29 DIAGNOSIS — A0472 Enterocolitis due to Clostridium difficile, not specified as recurrent: Secondary | ICD-10-CM | POA: Diagnosis present

## 2014-01-29 DIAGNOSIS — B3731 Acute candidiasis of vulva and vagina: Secondary | ICD-10-CM

## 2014-01-29 DIAGNOSIS — R21 Rash and other nonspecific skin eruption: Secondary | ICD-10-CM

## 2014-01-29 DIAGNOSIS — R7309 Other abnormal glucose: Secondary | ICD-10-CM

## 2014-01-29 DIAGNOSIS — K7689 Other specified diseases of liver: Secondary | ICD-10-CM | POA: Diagnosis present

## 2014-01-29 DIAGNOSIS — R739 Hyperglycemia, unspecified: Secondary | ICD-10-CM

## 2014-01-29 DIAGNOSIS — R0902 Hypoxemia: Secondary | ICD-10-CM

## 2014-01-29 DIAGNOSIS — E876 Hypokalemia: Secondary | ICD-10-CM | POA: Diagnosis present

## 2014-01-29 DIAGNOSIS — B373 Candidiasis of vulva and vagina: Secondary | ICD-10-CM

## 2014-01-29 DIAGNOSIS — R609 Edema, unspecified: Secondary | ICD-10-CM

## 2014-01-29 DIAGNOSIS — K8020 Calculus of gallbladder without cholecystitis without obstruction: Secondary | ICD-10-CM | POA: Diagnosis present

## 2014-01-29 DIAGNOSIS — L2089 Other atopic dermatitis: Secondary | ICD-10-CM

## 2014-01-29 DIAGNOSIS — N390 Urinary tract infection, site not specified: Secondary | ICD-10-CM | POA: Diagnosis present

## 2014-01-29 DIAGNOSIS — R635 Abnormal weight gain: Secondary | ICD-10-CM

## 2014-01-29 DIAGNOSIS — R8271 Bacteriuria: Secondary | ICD-10-CM

## 2014-01-29 DIAGNOSIS — Z8669 Personal history of other diseases of the nervous system and sense organs: Secondary | ICD-10-CM

## 2014-01-29 DIAGNOSIS — L0390 Cellulitis, unspecified: Secondary | ICD-10-CM

## 2014-01-29 DIAGNOSIS — Z794 Long term (current) use of insulin: Secondary | ICD-10-CM | POA: Diagnosis not present

## 2014-01-29 DIAGNOSIS — F3289 Other specified depressive episodes: Secondary | ICD-10-CM

## 2014-01-29 DIAGNOSIS — S93409A Sprain of unspecified ligament of unspecified ankle, initial encounter: Secondary | ICD-10-CM

## 2014-01-29 DIAGNOSIS — Z9119 Patient's noncompliance with other medical treatment and regimen: Secondary | ICD-10-CM

## 2014-01-29 DIAGNOSIS — Z833 Family history of diabetes mellitus: Secondary | ICD-10-CM | POA: Diagnosis not present

## 2014-01-29 DIAGNOSIS — L0231 Cutaneous abscess of buttock: Secondary | ICD-10-CM

## 2014-01-29 DIAGNOSIS — K521 Toxic gastroenteritis and colitis: Secondary | ICD-10-CM | POA: Diagnosis present

## 2014-01-29 DIAGNOSIS — L03119 Cellulitis of unspecified part of limb: Secondary | ICD-10-CM

## 2014-01-29 LAB — GLUCOSE, CAPILLARY
GLUCOSE-CAPILLARY: 268 mg/dL — AB (ref 70–99)
Glucose-Capillary: 209 mg/dL — ABNORMAL HIGH (ref 70–99)

## 2014-01-29 LAB — TSH: TSH: 3.29 u[IU]/mL (ref 0.350–4.500)

## 2014-01-29 LAB — URINE MICROSCOPIC-ADD ON

## 2014-01-29 LAB — CBC WITH DIFFERENTIAL/PLATELET
BLASTS: 0 %
Band Neutrophils: 6 % (ref 0–10)
Basophils Absolute: 0 10*3/uL (ref 0.0–0.1)
Basophils Relative: 0 % (ref 0–1)
Eosinophils Absolute: 0.1 10*3/uL (ref 0.0–0.7)
Eosinophils Relative: 1 % (ref 0–5)
HCT: 43.2 % (ref 36.0–46.0)
Hemoglobin: 14.8 g/dL (ref 12.0–15.0)
Lymphocytes Relative: 21 % (ref 12–46)
Lymphs Abs: 2.7 10*3/uL (ref 0.7–4.0)
MCH: 31.7 pg (ref 26.0–34.0)
MCHC: 34.3 g/dL (ref 30.0–36.0)
MCV: 92.5 fL (ref 78.0–100.0)
METAMYELOCYTES PCT: 3 %
MYELOCYTES: 1 %
Monocytes Absolute: 0.5 10*3/uL (ref 0.1–1.0)
Monocytes Relative: 4 % (ref 3–12)
NRBC: 0 /100{WBCs}
Neutro Abs: 9.7 10*3/uL — ABNORMAL HIGH (ref 1.7–7.7)
Neutrophils Relative %: 64 % (ref 43–77)
PLATELETS: 509 10*3/uL — AB (ref 150–400)
PROMYELOCYTES ABS: 0 %
RBC: 4.67 MIL/uL (ref 3.87–5.11)
RDW: 13.3 % (ref 11.5–15.5)
WBC: 13 10*3/uL — ABNORMAL HIGH (ref 4.0–10.5)

## 2014-01-29 LAB — URINALYSIS, ROUTINE W REFLEX MICROSCOPIC
Bilirubin Urine: NEGATIVE
Hgb urine dipstick: NEGATIVE
Ketones, ur: NEGATIVE mg/dL
Nitrite: POSITIVE — AB
Protein, ur: NEGATIVE mg/dL
Specific Gravity, Urine: 1.03 (ref 1.005–1.030)
Urobilinogen, UA: 0.2 mg/dL (ref 0.0–1.0)
pH: 5.5 (ref 5.0–8.0)

## 2014-01-29 LAB — COMPREHENSIVE METABOLIC PANEL
ALT: 10 U/L (ref 0–35)
AST: 28 U/L (ref 0–37)
Albumin: 3.4 g/dL — ABNORMAL LOW (ref 3.5–5.2)
Alkaline Phosphatase: 96 U/L (ref 39–117)
BUN: 9 mg/dL (ref 6–23)
CALCIUM: 9 mg/dL (ref 8.4–10.5)
CO2: 23 meq/L (ref 19–32)
Chloride: 97 mEq/L (ref 96–112)
Creatinine, Ser: 0.7 mg/dL (ref 0.50–1.10)
GFR calc Af Amer: >90 mL/min (ref >90)
GFR calc non Af Amer: 83 mL/min — ABNORMAL LOW (ref >90)
Glucose, Bld: 456 mg/dL — ABNORMAL HIGH (ref 70–99)
Potassium: 4 mEq/L (ref 3.7–5.3)
SODIUM: 134 meq/L — AB (ref 137–147)
Total Bilirubin: 0.8 mg/dL (ref 0.3–1.2)
Total Protein: 6.3 g/dL (ref 6.0–8.3)

## 2014-01-29 LAB — PRO B NATRIURETIC PEPTIDE: Pro B Natriuretic peptide (BNP): 311.6 pg/mL — ABNORMAL HIGH (ref 0–125)

## 2014-01-29 LAB — CREATININE, SERUM
Creatinine, Ser: 0.61 mg/dL (ref 0.50–1.10)
GFR calc Af Amer: >90 mL/min (ref >90)
GFR, EST NON AFRICAN AMERICAN: 87 mL/min — AB (ref >90)

## 2014-01-29 LAB — CBC
HEMATOCRIT: 42 % (ref 36.0–46.0)
Hemoglobin: 14.7 g/dL (ref 12.0–15.0)
MCH: 31.3 pg (ref 26.0–34.0)
MCHC: 35 g/dL (ref 30.0–36.0)
MCV: 89.6 fL (ref 78.0–100.0)
PLATELETS: 500 10*3/uL — AB (ref 150–400)
RBC: 4.69 MIL/uL (ref 3.87–5.11)
RDW: 13.3 % (ref 11.5–15.5)
WBC: 12.9 10*3/uL — AB (ref 4.0–10.5)

## 2014-01-29 LAB — TROPONIN I: Troponin I: <0.3 ng/mL (ref <0.30)

## 2014-01-29 LAB — GLUCOSE, POCT (MANUAL RESULT ENTRY): POC Glucose: 437 mg/dl — AB (ref 70–99)

## 2014-01-29 MED ORDER — ONDANSETRON HCL 4 MG PO TABS: 4.0000 mg | ORAL_TABLET | Freq: Four times a day (QID) | ORAL | Status: DC | PRN

## 2014-01-29 MED ORDER — ACETAMINOPHEN 325 MG PO TABS: 650.0000 mg | ORAL_TABLET | Freq: Four times a day (QID) | ORAL | Status: DC | PRN

## 2014-01-29 MED ORDER — DEXTROSE 5 % IV SOLN
1.0000 g | Freq: Once | INTRAVENOUS | Status: AC
  Administered 2014-01-29: 1 g via INTRAVENOUS

## 2014-01-29 MED ORDER — HYDROMORPHONE HCL PF 1 MG/ML IJ SOLN: 1.0000 mg | INTRAMUSCULAR | Status: DC | PRN

## 2014-01-29 MED ORDER — LISINOPRIL 40 MG PO TABS
40.0000 mg | ORAL_TABLET | Freq: Every day | ORAL | Status: DC
  Administered 2014-01-29 – 2014-01-30 (×2): 40 mg via ORAL
  Filled 2014-01-29 (×2): qty 1

## 2014-01-29 MED ORDER — SACCHAROMYCES BOULARDII 250 MG PO CAPS
250.0000 mg | ORAL_CAPSULE | Freq: Two times a day (BID) | ORAL | Status: DC
  Administered 2014-01-29 – 2014-02-02 (×8): 250 mg via ORAL
  Filled 2014-01-29 (×9): qty 1

## 2014-01-29 MED ORDER — HYDROXYUREA 500 MG PO CAPS
500.0000 mg | ORAL_CAPSULE | Freq: Two times a day (BID) | ORAL | Status: DC
  Administered 2014-01-29 – 2014-02-02 (×6): 500 mg via ORAL
  Filled 2014-01-29 (×9): qty 1

## 2014-01-29 MED ORDER — SODIUM CHLORIDE 0.9 % IV SOLN
INTRAVENOUS | Status: DC
  Administered 2014-01-30: 10:00:00 via INTRAVENOUS

## 2014-01-29 MED ORDER — INSULIN ASPART 100 UNIT/ML ~~LOC~~ SOLN
0.0000 [IU] | Freq: Three times a day (TID) | SUBCUTANEOUS | Status: DC
  Administered 2014-01-29: 11 [IU] via SUBCUTANEOUS
  Administered 2014-01-30 (×2): 3 [IU] via SUBCUTANEOUS
  Administered 2014-01-30: 4 [IU] via SUBCUTANEOUS
  Administered 2014-01-31: 3 [IU] via SUBCUTANEOUS
  Administered 2014-01-31: 4 [IU] via SUBCUTANEOUS
  Administered 2014-01-31 – 2014-02-01 (×2): 3 [IU] via SUBCUTANEOUS
  Administered 2014-02-01: 11 [IU] via SUBCUTANEOUS
  Administered 2014-02-02: 4 [IU] via SUBCUTANEOUS

## 2014-01-29 MED ORDER — INSULIN ISOPHANE & REGULAR (HUMAN 70-30)100 UNIT/ML KWIKPEN: 30.0000 [IU] | PEN_INJECTOR | Freq: Three times a day (TID) | SUBCUTANEOUS | Status: DC

## 2014-01-29 MED ORDER — ENOXAPARIN SODIUM 40 MG/0.4ML ~~LOC~~ SOLN
40.0000 mg | SUBCUTANEOUS | Status: DC
  Administered 2014-01-29 – 2014-02-01 (×4): 40 mg via SUBCUTANEOUS
  Filled 2014-01-29 (×5): qty 0.4

## 2014-01-29 MED ORDER — ACETAMINOPHEN 650 MG RE SUPP: 650.0000 mg | Freq: Four times a day (QID) | RECTAL | Status: DC | PRN

## 2014-01-29 MED ORDER — INSULIN ASPART 100 UNIT/ML ~~LOC~~ SOLN
0.0000 [IU] | Freq: Every day | SUBCUTANEOUS | Status: DC
  Administered 2014-01-29: 2 [IU] via SUBCUTANEOUS

## 2014-01-29 MED ORDER — HYDROCODONE-ACETAMINOPHEN 5-325 MG PO TABS
1.0000 | ORAL_TABLET | ORAL | Status: DC | PRN
  Administered 2014-01-31 – 2014-02-02 (×2): 2 via ORAL
  Filled 2014-01-29 (×2): qty 2

## 2014-01-29 MED ORDER — INSULIN ASPART PROT & ASPART (70-30 MIX) 100 UNIT/ML ~~LOC~~ SUSP
30.0000 [IU] | Freq: Three times a day (TID) | SUBCUTANEOUS | Status: DC
  Administered 2014-01-29 – 2014-02-02 (×11): 30 [IU] via SUBCUTANEOUS
  Filled 2014-01-29: qty 10

## 2014-01-29 MED ORDER — HYDROCODONE-ACETAMINOPHEN 5-325 MG PO TABS: 2.0000 | ORAL_TABLET | ORAL | Status: DC | PRN

## 2014-01-29 MED ORDER — INSULIN ISOPHANE & REGULAR (HUMAN 70-30)100 UNIT/ML KWIKPEN: 20.0000 [IU] | PEN_INJECTOR | Freq: Three times a day (TID) | SUBCUTANEOUS | Status: DC

## 2014-01-29 MED ORDER — INSULIN GLARGINE 100 UNIT/ML ~~LOC~~ SOLN
20.0000 [IU] | Freq: Two times a day (BID) | SUBCUTANEOUS | Status: DC
  Administered 2014-01-29: 20 [IU] via SUBCUTANEOUS
  Filled 2014-01-29 (×2): qty 0.2

## 2014-01-29 MED ORDER — ONDANSETRON HCL 4 MG/2ML IJ SOLN: 4.0000 mg | Freq: Four times a day (QID) | INTRAMUSCULAR | Status: DC | PRN

## 2014-01-29 MED ORDER — ALUM & MAG HYDROXIDE-SIMETH 200-200-20 MG/5ML PO SUSP
30.0000 mL | Freq: Four times a day (QID) | ORAL | Status: DC | PRN
Start: 2014-01-29 — End: 2014-02-02

## 2014-01-29 MED ORDER — SODIUM CHLORIDE 0.9 % IV BOLUS (SEPSIS)
1000.0000 mL | INTRAVENOUS | Status: AC
  Administered 2014-01-29: 1000 mL via INTRAVENOUS

## 2014-01-29 MED ORDER — ASPIRIN EC 81 MG PO TBEC
81.0000 mg | DELAYED_RELEASE_TABLET | Freq: Every day | ORAL | Status: DC
  Administered 2014-01-29 – 2014-02-02 (×5): 81 mg via ORAL
  Filled 2014-01-29 (×6): qty 1

## 2014-01-29 MED ORDER — CEFTRIAXONE SODIUM 1 G IJ SOLR
INTRAMUSCULAR | Status: AC
  Filled 2014-01-29: qty 10

## 2014-01-29 MED ORDER — DEXTROSE 5 % IV SOLN
1.0000 g | INTRAVENOUS | Status: DC
  Administered 2014-01-30 – 2014-02-01 (×3): 1 g via INTRAVENOUS
  Filled 2014-01-29 (×4): qty 10

## 2014-01-29 NOTE — H&P (Signed)
History and Physical       Hospital Admission Note Date: 01/29/2014  Patient name: Amanda [name] Medical record number: [id number] Date of birth: [birth year] Age: 73 y.o. Gender: female  PCP: SCHOENHOFF,DEBBIE, MD    Chief Complaint:  Dizziness with confusion and diarrhea  HPI: Patient is a 73 year old female with history of uncontrolled diabetes, myeloproliferative disorder (follows Dr. Alvy Bimler), gout, hypertension presented to med center [facility] for persistent dizziness, diarrhea and confusion. History was obtained from the patient and her husband present in the room. Per patient, she had seen surgery for cellulitis and abscess on her back in January and was on antibiotics. Patient had been feeling dizzy off and on. On Friday, 4 days ago, she went to grocery shopping and bought herself had packaged cut fruit. Patient reports that she ate apple and did not feel good. She started having subjective fever, chills, nausea vomiting and diarrhea. Patient's husband ate the rest of the fruit at age and had the same symptoms the next day. Patient reports that since then she's not been eating, not taking her insulin and feeling very dizzy and staggering. Patient was seen in the ED yesterday but discharged after she felt somewhat better. Patient had outpatient appointment with her PCP today and felt very dizzy in the office and was sent to med center [facility] ER for further evaluation. Her husband also reported that in the last 4 or 5 days, she's off and on confused and at times did not understand her. Lab evaluation showed UTI, CT abdomen and pelvis done yesterday had shown liver cirrhosis, nonspecific hypodensities throughout the spleen. CT head today showed age-related volume loss but no acute mass, hemorrhage or infarct  Review of Systems:  Constitutional: ++ fever, chills, diaphoresis, poor appetite and fatigue.  HEENT: Denies photophobia,  eye pain, redness, hearing loss, ear pain, congestion, sore throat, rhinorrhea, sneezing, mouth sores, trouble swallowing, neck pain, neck stiffness and tinnitus.   Respiratory: Denies SOB, DOE, cough, chest tightness,  and wheezing.   Cardiovascular: Denies chest pain, palpitations and leg swelling.  Gastrointestinal: Please see history of present illness.  Genitourinary: Denies dysuria, ++ urgency, frequency, no hematuria, flank pain and difficulty urinating.  Musculoskeletal: Denies myalgias, back pain, joint swelling, arthralgias and gait problem.  Skin: Denies pallor, rash and wound.  Neurological: Denies  seizures, syncope, numbness and headaches.  feels dizzy and lightheaded Hematological: Denies adenopathy. Easy bruising, personal or family bleeding history  Psychiatric/Behavioral: Denies suicidal ideation, mood changes, confusion, nervousness, sleep disturbance and agitation  Past Medical History: Past Medical History  Diagnosis Date  . Hypertension   . Hyperlipidemia   . Diabetes mellitus 2008  . Depression   . Obesity   . Psoriasis   . Polycythemia     Dr. Elease Hashimoto- [facility]  . Herpes simplex   . Gout   . Hyperglycemia 05/31/2013  . HTN (hypertension) 05/31/2013   Past Surgical History  Procedure Laterality Date  . Breast surgery      milk duct  . Total abdominal hysterectomy w/ bilateral salpingoophorectomy      for heavy periods  . Tonsillectomy      Medications: Prior to Admission medications   Medication Sig Start Date End Date Taking? Authorizing Provider  aspirin EC 81 MG tablet Take 81 mg by mouth daily.   Yes Historical Provider, MD  HYDROcodone-acetaminophen (NORCO/VICODIN) 5-325 MG per tablet Take 2 tablets by mouth every 4 (four) hours as needed for moderate pain.   Yes Historical  Provider, MD  hydrocortisone 2.5 % cream Apply 1 application topically 2 (two) times daily.   Yes Historical Provider, MD  hydroxyurea (HYDREA) 500 MG capsule Take 1  capsule (500 mg total) by mouth 2 (two) times daily. 12/10/13  Yes [name], MD  ibuprofen (ADVIL,MOTRIN) 200 MG tablet Take 200 mg by mouth every 6 (six) hours as needed for moderate pain.   Yes Historical Provider, MD  Insulin Isophane & Regular Human (HUMULIN 70/30 KWIKPEN) (70-30) 100 UNIT/ML PEN Inject 20-65 Units into the skin 3 (three) times daily after meals. 65 units at breakfast, 20 units at lunch, and 65 units at dinner   Yes Historical Provider, MD  lisinopril (PRINIVIL,ZESTRIL) 40 MG tablet Take 1 tablet (40 mg total) by mouth daily. 08/13/13  Yes Lanice Shirts, MD  nystatin cream (MYCOSTATIN) Apply 1 application topically 2 (two) times daily.   Yes Historical Provider, MD  ondansetron (ZOFRAN) 8 MG tablet Take 1 tablet (8 mg total) by mouth every 8 (eight) hours as needed for nausea or vomiting. 01/28/14  Yes Richarda Blade, MD    Allergies:   Allergies  Allergen Reactions  . Tape Hives  . Doxycycline Hives, Swelling and Rash  . Latex Hives, Itching and Rash    Social History:  reports that she quit smoking about 22 years ago. She has never used smokeless tobacco. She reports that she does not drink alcohol or use illicit drugs.  Family History: Family History  Problem Relation Age of Onset  . Diabetes Mother   . Heart disease Mother     CAD  . Hyperlipidemia Mother   . Hypertension Mother   . Kidney disease Mother   . Kidney disease Father   . Cancer Other     breast    Physical Exam: Blood pressure 141/67, pulse 88, temperature 97.6 F (36.4 C), temperature source Axillary, resp. rate 20, height 5' 4.5" (1.638 m), weight 87 kg (191 lb 12.8 oz), SpO2 97.00%. General: Alert, awake, oriented x3, in no acute distress, feels dizzy on sitting up. HEENT: normocephalic, atraumatic, anicteric sclera, pink conjunctiva, pupils equal and reactive to light and accomodation, oropharynx clear Neck: supple, no masses or lymphadenopathy, no goiter, no bruits  Heart:  Regular rate and rhythm, without murmurs, rubs or gallops. Lungs: Clear to auscultation bilaterally, no wheezing, rales or rhonchi. Abdomen: Soft, nontender, nondistended, positive bowel sounds, no masses. Extremities: No clubbing, cyanosis or edema with positive pedal pulses. Neuro: Grossly intact, no focal neurological deficits, strength 5/5 upper and lower extremities bilaterally, cerebellar signs intact, finger-to-nose and heel-to-shin intact Psych: alert and oriented x 3, normal mood and affect Skin: no rashes or lesions, warm and dry   LABS on Admission:  Basic Metabolic Panel:  Recent Labs Lab 01/28/14 1345 01/29/14 1203  NA 131* 134*  K 4.0 4.0  CL 93* 97  CO2 21 23  GLUCOSE 416* 456*  BUN 6 9  CREATININE 0.54 0.70  CALCIUM 9.5 9.0   Liver Function Tests:  Recent Labs Lab 01/28/14 1345 01/29/14 1203  AST 19 28  ALT 12 10  ALKPHOS 121* 96  BILITOT 1.0 0.8  PROT 7.0 6.3  ALBUMIN 3.7 3.4*    Recent Labs Lab 01/28/14 1345  LIPASE 24   No results found for this basename: AMMONIA,  in the last 168 hours CBC:  Recent Labs Lab 01/28/14 1345 01/29/14 1203  WBC 18.0* 13.0*  NEUTROABS 15.6* 9.7*  HGB 15.8* 14.8  HCT 45.0 43.2  MCV  89.3 92.5  PLT 629* 509*   Cardiac Enzymes:  Recent Labs Lab 01/29/14 1203  TROPONINI <0.30   BNP: No components found with this basename: POCBNP,  CBG: No results found for this basename: GLUCAP,  in the last 168 hours   Radiological Exams on Admission: Dg Chest 2 View  01/29/2014   CLINICAL DATA:  Dizziness and dehydration. Recent food poisoning over the weekend.  EXAM: CHEST  2 VIEW  COMPARISON:  Prior chest x-ray 05/31/2013  FINDINGS: Stable cardiac and mediastinal contours. Slightly increased interstitial prominence in a predominantly perihilar distribution in both mid lungs. There is pulmonary vascular congestion with slight cephalization. Overall, the findings are most consistent with mild interstitial pulmonary  edema. No pleural effusion or pneumothorax. No focal airspace consolidation. The lungs are hyperexpanded on the lateral view and there is central airway thickening. No acute osseous abnormality.  IMPRESSION: Increased perihilar interstitial prominence is most suggestive of mild pulmonary interstitial edema. Atypical or viral respiratory infection could have a similar appearance in the appropriate clinical setting.   Electronically Signed   By: Jacqulynn Cadet M.D.   On: 01/29/2014 13:13   Ct Head Wo Contrast  01/29/2014   CLINICAL DATA:  Dizziness  EXAM: CT HEAD WITHOUT CONTRAST  TECHNIQUE: Contiguous axial images were obtained from the base of the skull through the vertex without intravenous contrast. Study was obtained within 24 hr of patient's arrival at the emergency department.  COMPARISON:  October 24, 2012  FINDINGS: The ventricles are mildly prominent but stable. The left lateral ventricle is slightly larger than the right lateral ventricle, an anatomic variant. The sulci appear within normal limits for age. There is no mass, hemorrhage, extra-axial fluid collection, or midline shift. Mild small vessel disease in the centra semiovale is stable. There is no new gray-white compartment lesion. No acute infarct. Bony calvarium appears intact. The mastoid air cells are clear.  IMPRESSION: Age related volume loss. Mild periventricular small vessel disease. No intracranial mass, hemorrhage, or acute appearing infarct.   Electronically Signed   By: Lowella Grip M.D.   On: 01/29/2014 13:13   Ct Abdomen Pelvis W Contrast  01/28/2014   CLINICAL DATA:  Nausea and vomiting  EXAM: CT ABDOMEN AND PELVIS WITH CONTRAST  TECHNIQUE: Multidetector CT imaging of the abdomen and pelvis was performed using the standard protocol following bolus administration of intravenous contrast.  CONTRAST:  168m OMNIPAQUE IOHEXOL 300 MG/ML  SOLN  COMPARISON:  CT PELVIS W/CM dated 09/14/2013  FINDINGS: The contour of the liver is  nodular. The left lobe is hypertrophied. These findings are compatible with cirrhotic change. There is low-density throughout the liver compatible with coexisting diffuse hepatic steatosis.  Splenomegaly. Multiple hypodensities are scattered in the upper spleen. They are present at the inferior tip of the spleen of unknown significance.  Dependent atelectasis at the lung bases.  The pancreas, adrenal glands are within normal limits. Gallbladder is somewhat distended without obvious inflammatory change  Kidneys are within normal limits.  Small and large bowel are both decompressed.  Advanced atherosclerotic change of the aorta and iliac vasculature.  Bladder is mildly distended  Uterus and adnexa are within normal limits.  Degenerative changes in the lower lumbar spine.  No free-fluid. No abnormal retroperitoneal adenopathy. Small para-aortic nodes are noted. Small portacaval node. Small gastrohepatic ligament nodes.  IMPRESSION: Cirrhotic liver.  Nonspecific hypodensities throughout the spleen. These are nonspecific and may represent represent infarcts or an inflammatory process.   Electronically Signed   By:  Art  Hoss M.D.   On: 01/28/2014 16:56    Assessment/Plan Principal Problem:   UTI (lower urinary tract infection): - Obtain urine culture and sensitivity, place on IV Rocephin, also placed on florastor  Active Problems:   Essential hypertension, benign - Continue lisinopril    Type II or unspecified type diabetes mellitus without mention of complication, uncontrolled - The patient has not taken her insulin in last several days as she was not eating well - Placed on resistant scale and Lantus 20 units BID (she is on very high doses of 70/30 insulin TID 65am-20noon-65pm). If blood sugars are not improving, will place on insulin glucose stabilizer.  - Obtain hemoglobin A1c    Dizziness: Likely due to #1 and recent gastroenteritis, significant dehydration - Obtain orthostatic vitals, MRI of the  brain to rule out any cerebellar CVA. If negative, will not pursue any stroke workup.    Diarrhea - Likely due to food poisoning as patient's husband had similar symptoms as well, obtain GI pathogen panel, C. difficile as patient was recently on antibiotics as well    Dehydration - Aggressive IV fluid hydration  DVT prophylaxis: Lovenox  CODE STATUS: Full CODE STATUS  Family Communication: Admission, patients condition and plan of care including tests being ordered have been discussed with the patient and husband who indicates understanding and agree with the plan and Code Status   Further plan will depend as patient's clinical course evolves and further radiologic and laboratory data become available.   Time Spent on Admission: 1 hour  Jonelle Bann Krystal Eaton M.D. [facility] 01/29/2014, 6:38 PM Pager: [phone]  If 7PM-7AM, please contact night-coverage [email or web address] Password [email or web address]  **Disclaimer: This note was dictated with voice recognition software. Similar sounding words can inadvertently be transcribed and this note may contain transcription errors which may not have been corrected upon publication of note.**

## 2014-01-29 NOTE — ED Notes (Signed)
Pt transported to ed from dr. Ardelle Balls office on stretcher by er staff x 3. Pt is awake and alert, reports feeling dizzy x 1 month, worse today. Pt states she presented to dr. Ardelle Balls office for a regular md apt, and felt dizzy. Per dr. Waldemar Dickens, pt cbg is 437. Pt states she "got food poisoning" on Saturday, with vomiting and diarrhea, which resolved yesterday, pt states "I still feel like I'm dehydrated.Marland KitchenMarland Kitchen"

## 2014-01-29 NOTE — ED Notes (Signed)
Attempting to call report to RN at [place].  On hold

## 2014-01-29 NOTE — ED Notes (Signed)
Pt to room 4 from upstairs doctors office.  States that she was seen yesterday at [place] and told that she had food poisoning.  Reports that she ate an apple that did not taste right on Friday and vomited all weekend.  Denies acute pain-reports chronic pain in her toe.  Reports that she also has been dizzy x 1-2 months.  pts CBG high-states that she has not eaten in several days.  Pt states that she does not want to go to the hospital, she wants to go home.

## 2014-01-29 NOTE — ED Notes (Signed)
All previous charting by kc, rn was actually entered by this rn.

## 2014-01-29 NOTE — ED Provider Notes (Signed)
CSN: [id number]     Arrival date & time 01/29/14  1026 History   First MD Initiated Contact with Patient 01/29/14 1148     Chief Complaint  Patient presents with  . Dizziness     (Consider location/radiation/quality/duration/timing/severity/associated sxs/prior Treatment) Patient is a 73 y.o. female presenting with vomiting. The history is provided by the patient.  Emesis Severity:  Mild Duration:  4 days Timing:  Constant Quality:  Stomach contents Progression:  Unchanged Chronicity:  New Recent urination:  Decreased Relieved by:  Nothing Worsened by:  Nothing tried Ineffective treatments:  None tried Associated symptoms: diarrhea   Associated symptoms: no abdominal pain and no headaches   Diarrhea:    Quality:  Watery   Severity:  Moderate   Duration:  4 days   Timing:  Constant   Progression:  Unchanged   Past Medical History  Diagnosis Date  . Hypertension   . Hyperlipidemia   . Diabetes mellitus 2008  . Depression   . Obesity   . Psoriasis   . Polycythemia     Dr. Elease Hashimoto- [facility]  . Herpes simplex   . Gout   . Hyperglycemia 05/31/2013  . HTN (hypertension) 05/31/2013   Past Surgical History  Procedure Laterality Date  . Breast surgery      milk duct  . Total abdominal hysterectomy w/ bilateral salpingoophorectomy      for heavy periods  . Tonsillectomy     Family History  Problem Relation Age of Onset  . Diabetes Mother   . Heart disease Mother     CAD  . Hyperlipidemia Mother   . Hypertension Mother   . Kidney disease Mother   . Kidney disease Father   . Cancer Other     breast   History  Substance Use Topics  . Smoking status: Former Smoker -- 68 years    Quit date: 10/19/1991  . Smokeless tobacco: Never Used  . Alcohol Use: No   OB History   Grav Para Term Preterm Abortions TAB SAB Ect Mult Living                 Review of Systems  Constitutional: Negative for fever and fatigue.  HENT: Negative for congestion and drooling.    Eyes: Negative for pain.  Respiratory: Positive for shortness of breath. Negative for cough.   Cardiovascular: Negative for chest pain.  Gastrointestinal: Positive for nausea, vomiting and diarrhea. Negative for abdominal pain.  Genitourinary: Negative for dysuria and hematuria.  Musculoskeletal: Negative for back pain, gait problem and neck pain.  Skin: Negative for color change.  Neurological: Positive for dizziness. Negative for headaches.  Hematological: Negative for adenopathy.  Psychiatric/Behavioral: Negative for behavioral problems.  All other systems reviewed and are negative.     Allergies  Tape; Doxycycline; and Latex  Home Medications   Prior to Admission medications   Medication Sig Start Date End Date Taking? Authorizing Provider  HYDROcodone-acetaminophen (NORCO) 5-325 MG per tablet Take 2 tablets by mouth every 4 (four) hours as needed. 01/21/14   Veryl Speak, MD  HYDROcodone-acetaminophen (NORCO) 5-325 MG per tablet Take 2 tablets by mouth every 4 (four) hours as needed. 01/28/14   Richarda Blade, MD  hydrocortisone 2.5 % cream Apply 1 application topically 2 (two) times daily.    Historical Provider, MD  hydroxyurea (HYDREA) 500 MG capsule Take 1 capsule (500 mg total) by mouth 2 (two) times daily. 12/10/13   [name], MD  Insulin Isophane & Regular Human (  HUMULIN 70/30 KWIKPEN) (70-30) 100 UNIT/ML PEN Inject 20-65 Units into the skin 3 (three) times daily after meals. 65 units at breakfast, 20 units at lunch, and 65 units at dinner    Historical Provider, MD  lisinopril (PRINIVIL,ZESTRIL) 40 MG tablet Take 1 tablet (40 mg total) by mouth daily. 08/13/13   Lanice Shirts, MD  nystatin cream (MYCOSTATIN) Apply 1 application topically 2 (two) times daily.    Historical Provider, MD  ondansetron (ZOFRAN) 8 MG tablet Take 1 tablet (8 mg total) by mouth every 8 (eight) hours as needed for nausea or vomiting. 01/28/14   Richarda Blade, MD   BP 159/71  Pulse 90   Temp(Src) 98.6 F (37 C) (Oral)  Resp 18  SpO2 93% Physical Exam  Nursing note and vitals reviewed. Constitutional: She is oriented to person, place, and time. She appears well-developed and well-nourished.  HENT:  Head: Normocephalic and atraumatic.  Mouth/Throat: Oropharynx is clear and moist. No oropharyngeal exudate.  Eyes: Conjunctivae and EOM are normal. Pupils are equal, round, and reactive to light.  Neck: Normal range of motion. Neck supple.  Cardiovascular: Normal rate, regular rhythm, normal heart sounds and intact distal pulses.  Exam reveals no gallop and no friction rub.   No murmur heard. Pulmonary/Chest: Effort normal and breath sounds normal. No respiratory distress. She has no wheezes.  Abdominal: Soft. Bowel sounds are normal. There is no tenderness. There is no rebound and no guarding.  Genitourinary:  Erythema between clefts of the buttocks extending to the perineum.  No evidence of ongoing buttock abscess.  Musculoskeletal: Normal range of motion. She exhibits no edema and no tenderness.  Neurological: She is alert and oriented to person, place, and time. She displays a negative Romberg sign. Coordination and gait normal.  alert, oriented x3 speech: normal in context and clarity memory: intact grossly cranial nerves II-XII: intact motor strength: full proximally and distally no involuntary movements or tremors sensation: intact to light touch diffusely  cerebellar: finger-to-nose and heel-to-shin intact gait: normal forwards and backwards, normal tandem gait   Skin: Skin is warm and dry.  Psychiatric: She has a normal mood and affect. Her behavior is normal.    ED Course  Procedures (including critical care time) Labs Review Labs Reviewed  CBC WITH DIFFERENTIAL - Abnormal; Notable for the following:    WBC 13.0 (*)    Platelets 509 (*)    Neutro Abs 9.7 (*)    All other components within normal limits  COMPREHENSIVE METABOLIC PANEL - Abnormal;  Notable for the following:    Sodium 134 (*)    Glucose, Bld 456 (*)    Albumin 3.4 (*)    GFR calc non Af Amer 83 (*)    All other components within normal limits  PRO B NATRIURETIC PEPTIDE - Abnormal; Notable for the following:    Pro B Natriuretic peptide (BNP) 311.6 (*)    All other components within normal limits  URINALYSIS, ROUTINE W REFLEX MICROSCOPIC - Abnormal; Notable for the following:    APPearance CLOUDY (*)    Glucose, UA >1000 (*)    Nitrite POSITIVE (*)    Leukocytes, UA SMALL (*)    All other components within normal limits  URINE MICROSCOPIC-ADD ON - Abnormal; Notable for the following:    Bacteria, UA MANY (*)    All other components within normal limits  CBC - Abnormal; Notable for the following:    WBC 12.9 (*)    Platelets 500 (*)  All other components within normal limits  CREATININE, SERUM - Abnormal; Notable for the following:    GFR calc non Af Amer 87 (*)    All other components within normal limits  GLUCOSE, CAPILLARY - Abnormal; Notable for the following:    Glucose-Capillary 268 (*)    All other components within normal limits  URINE CULTURE  URINE CULTURE  CLOSTRIDIUM DIFFICILE BY PCR  OVA AND PARASITE EXAMINATION  TROPONIN I  TSH  CBC  GI PATHOGEN PANEL BY PCR, STOOL  RETICULOCYTES  BASIC METABOLIC PANEL    Imaging Review Ct Abdomen Pelvis W Contrast  01/28/2014   CLINICAL DATA:  Nausea and vomiting  EXAM: CT ABDOMEN AND PELVIS WITH CONTRAST  TECHNIQUE: Multidetector CT imaging of the abdomen and pelvis was performed using the standard protocol following bolus administration of intravenous contrast.  CONTRAST:  116m OMNIPAQUE IOHEXOL 300 MG/ML  SOLN  COMPARISON:  CT PELVIS W/CM dated 09/14/2013  FINDINGS: The contour of the liver is nodular. The left lobe is hypertrophied. These findings are compatible with cirrhotic change. There is low-density throughout the liver compatible with coexisting diffuse hepatic steatosis.  Splenomegaly.  Multiple hypodensities are scattered in the upper spleen. They are present at the inferior tip of the spleen of unknown significance.  Dependent atelectasis at the lung bases.  The pancreas, adrenal glands are within normal limits. Gallbladder is somewhat distended without obvious inflammatory change  Kidneys are within normal limits.  Small and large bowel are both decompressed.  Advanced atherosclerotic change of the aorta and iliac vasculature.  Bladder is mildly distended  Uterus and adnexa are within normal limits.  Degenerative changes in the lower lumbar spine.  No free-fluid. No abnormal retroperitoneal adenopathy. Small para-aortic nodes are noted. Small portacaval node. Small gastrohepatic ligament nodes.  IMPRESSION: Cirrhotic liver.  Nonspecific hypodensities throughout the spleen. These are nonspecific and may represent represent infarcts or an inflammatory process.   Electronically Signed   By: AMaryclare BeanM.D.   On: 01/28/2014 16:56     EKG Interpretation   Date/Time:  Tuesday January 29 2014 10:29:43 EDT Ventricular Rate:  84 PR Interval:  134 QRS Duration: 94 QT Interval:  396 QTC Calculation: 467 R Axis:   -16 Text Interpretation:  Normal sinus rhythm Normal ECG      MDM   Final diagnoses:  Vomiting  Diarrhea  SOB (shortness of breath)  Pulmonary edema  UTI (lower urinary tract infection)    12:23 PM 73y.o. female who presents with nausea vomiting and diarrhea for the last 4 days. She denies any fevers but has had some mild persistent dizziness for the last few days. She denies any abdominal pain now. [facility] CT scan of her abdomen. She was noted by her primary care physician to have low oxygen saturation on her evaluation today. She is still not tolerating po at home. Will get screening labwork, CT of head.  Found to have UTI, will order rocephin.   The patient will be admitted to [facility].         FBlanchard Kelch MD 01/29/14 2053

## 2014-01-29 NOTE — ED Notes (Signed)
Pt. Asking for food then asking what next then complains about the ER never feeding the Pt.s.   RN explained to Pt. That she will be taken care of appropriately and we need to do her blood work and get results before any food can be taken in.  RN explained that we are attempting to bring her blood sugar down with the NS and that we would like for her to wear oxygen.  Pt. Refuses the oxygen and continues to complain about everything and everybody.

## 2014-01-29 NOTE — Progress Notes (Signed)
Subjective:    Patient ID: Amanda [name], female    DOB: 1941/02/07, 73 y.o.   MRN: [id number]  HPI  [name] is here for follow up with her husband  Since last visit in January she has been under care of surgery for cellulitis and abscess   She has also seen hematologist for her known myeloproliferative disorder -  See Dr. Grant Fontana note.    Advised to take her ASA and hydroxyurea.     [name] has long history of non-adherence to medications - finances have been a problem in the past.   Her endocrinologist is trying to control her as best as she can.    She was seen in ER yesterday for N/V/D illness .  Labs consistent with myeloproliferative disorder and glucose 416.    Liver enzymes normal but alk phos elevated at 121.      Called to room by Jolayne Haines  -pt very dizzy , staggering  Pulse ox 90%    Glucose  437 this am.  Pt tells me she has had very little fluids last 3-4 days.  Has had vomiting /diarrheal illness last several days.  No documented fever.    I do not see EKG or CXR from yesterday.  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    Allergies  Allergen Reactions  . Tape Hives  . Doxycycline Hives, Swelling and Rash  . Latex Hives, Itching and Rash   Past Medical History  Diagnosis Date  . Hypertension   . Hyperlipidemia   . Diabetes mellitus 2008  . Depression   . Obesity   . Psoriasis   . Polycythemia     Dr. Elease Hashimoto- [facility]  . Herpes simplex   . Gout   . Hyperglycemia 05/31/2013  . HTN (hypertension) 05/31/2013   Past Surgical History  Procedure Laterality Date  . Breast surgery      milk duct  . Total abdominal hysterectomy w/ bilateral salpingoophorectomy      for heavy periods  . Tonsillectomy     History   Social History  . Marital Status: Married    Spouse Name: N/A    Number of Children: N/A  . Years of Education: N/A   Occupational History  . Not on file.   Social History Main Topics  . Smoking status: Former Smoker -- 54  years    Quit date: 10/19/1991  . Smokeless tobacco: Never Used  . Alcohol Use: No  . Drug Use: No  . Sexual Activity: Yes    Birth Control/ Protection: None   Other Topics Concern  . Not on file   Social History Narrative  . No narrative on file   Family History  Problem Relation Age of Onset  . Diabetes Mother   . Heart disease Mother     CAD  . Hyperlipidemia Mother   . Hypertension Mother   . Kidney disease Mother   . Kidney disease Father   . Cancer Other     breast   Patient Active Problem List   Diagnosis Date Noted  . Candidiasis of genitalia in female 11/19/2013  . Cellulitis and abscess of buttock, left 11/01/2013  . Edema 07/09/2013  . Constipation 07/09/2013  . Type II or unspecified type diabetes mellitus without mention of complication, uncontrolled 06/07/2013  . Leukocytosis, unspecified 06/02/2013  . Bacteria in urine 06/01/2013  . Hyperglycemia 05/31/2013  . Gout 05/31/2013  . 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 HTN (hypertension) 05/31/2013  .  History of Bell's palsy 05/26/2013  . DERMATITIS, ATOPIC 11/16/2010  . DIZZINESS 11/16/2010  . WEIGHT GAIN 11/16/2010  . HERPETIC WHITLOW 06/30/2010  . HYPERGLYCEMIA 05/07/2010  . DIASTOLIC DYSFUNCTION 67/61/9509  . DYSPNEA 02/17/2010  . ABSCESS, SKIN 12/22/2009  . SKIN RASH 12/22/2009  . HYPERLIPIDEMIA 12/15/2009  . DIABETES MELLITUS, TYPE II 11/24/2009  . POLYCYTHEMIA 11/24/2009  . Essential hypertension, benign 11/24/2009  . PSORIASIS 11/24/2009  . Depressive disorder, not elsewhere classified 09/13/2009  . ABSCESS, LEG 09/13/2009  . SPRAIN/STRAIN, ANKLE NOS 01/21/2009   Current Outpatient Prescriptions on File Prior to Visit  Medication Sig Dispense Refill  . HYDROcodone-acetaminophen (NORCO) 5-325 MG per tablet Take 2 tablets by mouth every 4 (four) hours as needed.  15 tablet  0  . HYDROcodone-acetaminophen (NORCO) 5-325 MG per tablet Take 2 tablets by mouth every 4 (four) hours as needed.   20 tablet  0  . hydrocortisone 2.5 % cream Apply 1 application topically 2 (two) times daily.      . hydroxyurea (HYDREA) 500 MG capsule Take 1 capsule (500 mg total) by mouth 2 (two) times daily.  60 capsule  6  . Insulin Isophane & Regular Human (HUMULIN 70/30 KWIKPEN) (70-30) 100 UNIT/ML PEN Inject 20-65 Units into the skin 3 (three) times daily after meals. 65 units at breakfast, 20 units at lunch, and 65 units at dinner      . lisinopril (PRINIVIL,ZESTRIL) 40 MG tablet Take 1 tablet (40 mg total) by mouth daily.  90 tablet  3  . nystatin cream (MYCOSTATIN) Apply 1 application topically 2 (two) times daily.      . ondansetron (ZOFRAN) 8 MG tablet Take 1 tablet (8 mg total) by mouth every 8 (eight) hours as needed for nausea or vomiting.  20 tablet  0   No current facility-administered medications on file prior to visit.        Review of Systems See HPI    Objective:   Physical Exam Physical Exam  Nursing note and vitals reviewed.   Supine BP  140/76           Standing  136/78   Constitutional: She is oriented to person, place, and time. She appears well-developed and well-nourished.  HENT:  Head: Normocephalic and atraumatic.  Cardiovascular: Normal rate and regular rhythm. Exam reveals no gallop and no friction rub.  No murmur heard.  Pulmonary/Chest: Breath sounds normal. She has no wheezes. She has no rales.  Neurological: She is alert and oriented to person, place, and time.  Skin: Skin is warm and dry.  Psychiatric: She has a normal mood and affect. Her behavior is normal.          Assessment & Plan:  N/V/D   Near syncope/  Hypoxemia  .    With near syncopal episode and low pulse ox o f90%    I feel she needs telemetry  monitering and further eval for ACS  And stat CXR   And further ER eval.  With possible admission .    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

## 2014-01-30 DIAGNOSIS — A0472 Enterocolitis due to Clostridium difficile, not specified as recurrent: Secondary | ICD-10-CM

## 2014-01-30 DIAGNOSIS — A419 Sepsis, unspecified organism: Principal | ICD-10-CM

## 2014-01-30 LAB — GI PATHOGEN PANEL BY PCR, STOOL
C difficile toxin A/B: POSITIVE
CRYPTOSPORIDIUM BY PCR: NEGATIVE
Campylobacter by PCR: NEGATIVE
E COLI (ETEC) LT/ST: NEGATIVE
E coli (STEC): NEGATIVE
E coli 0157 by PCR: NEGATIVE
G LAMBLIA BY PCR: NEGATIVE
NOROVIRUS G1/G2: NEGATIVE
Rotavirus A by PCR: NEGATIVE
SHIGELLA BY PCR: NEGATIVE
Salmonella by PCR: NEGATIVE

## 2014-01-30 LAB — GLUCOSE, CAPILLARY
Glucose-Capillary: 145 mg/dL — ABNORMAL HIGH (ref 70–99)
Glucose-Capillary: 167 mg/dL — ABNORMAL HIGH (ref 70–99)
Glucose-Capillary: 136 mg/dL — ABNORMAL HIGH (ref 70–99)
Glucose-Capillary: 113 mg/dL — ABNORMAL HIGH (ref 70–99)

## 2014-01-30 LAB — RETICULOCYTES
RBC.: 4.38 MIL/uL (ref 3.87–5.11)
Retic Count, Absolute: 105.1 10*3/uL (ref 19.0–186.0)
Retic Ct Pct: 2.4 % (ref 0.4–3.1)

## 2014-01-30 LAB — BASIC METABOLIC PANEL
BUN: 8 mg/dL (ref 6–23)
CO2: 25 mEq/L (ref 19–32)
CREATININE: 0.64 mg/dL (ref 0.50–1.10)
Calcium: 8.5 mg/dL (ref 8.4–10.5)
Chloride: 106 mEq/L (ref 96–112)
GFR calc non Af Amer: 86 mL/min — ABNORMAL LOW (ref >90)
Glucose, Bld: 131 mg/dL — ABNORMAL HIGH (ref 70–99)
Potassium: 3.1 mEq/L — ABNORMAL LOW (ref 3.7–5.3)
SODIUM: 141 meq/L (ref 137–147)

## 2014-01-30 LAB — CLOSTRIDIUM DIFFICILE BY PCR: Toxigenic C. Difficile by PCR: POSITIVE — AB

## 2014-01-30 LAB — PROTIME-INR
INR: 1.13 (ref 0.00–1.49)
Prothrombin Time: 14.3 seconds (ref 11.6–15.2)

## 2014-01-30 LAB — CBC
HCT: 39.3 % (ref 36.0–46.0)
HEMOGLOBIN: 13.7 g/dL (ref 12.0–15.0)
MCH: 31.3 pg (ref 26.0–34.0)
MCHC: 34.9 g/dL (ref 30.0–36.0)
MCV: 89.7 fL (ref 78.0–100.0)
PLATELETS: 468 10*3/uL — AB (ref 150–400)
RBC: 4.38 MIL/uL (ref 3.87–5.11)
RDW: 13.3 % (ref 11.5–15.5)
WBC: 14.5 10*3/uL — ABNORMAL HIGH (ref 4.0–10.5)

## 2014-01-30 LAB — OVA AND PARASITE EXAMINATION: OVA AND PARASITES: NONE SEEN

## 2014-01-30 MED ORDER — HYDRALAZINE HCL 20 MG/ML IJ SOLN
10.0000 mg | Freq: Four times a day (QID) | INTRAMUSCULAR | Status: DC | PRN
  Administered 2014-02-01: 10 mg via INTRAVENOUS
  Filled 2014-01-30 (×2): qty 0.5

## 2014-01-30 MED ORDER — VANCOMYCIN 50 MG/ML ORAL SOLUTION
125.0000 mg | Freq: Four times a day (QID) | ORAL | Status: DC
  Administered 2014-01-30 – 2014-02-02 (×13): 125 mg via ORAL
  Filled 2014-01-30 (×16): qty 2.5

## 2014-01-30 MED ORDER — POTASSIUM CHLORIDE IN NACL 20-0.9 MEQ/L-% IV SOLN
INTRAVENOUS | Status: DC
  Administered 2014-01-30: 13:00:00 via INTRAVENOUS
  Administered 2014-01-31: 50 mL/h via INTRAVENOUS
  Administered 2014-02-01: 09:00:00 via INTRAVENOUS
  Filled 2014-01-30 (×5): qty 1000

## 2014-01-30 MED ORDER — INSULIN GLARGINE 100 UNIT/ML ~~LOC~~ SOLN
20.0000 [IU] | Freq: Every day | SUBCUTANEOUS | Status: DC
  Administered 2014-01-30 – 2014-02-01 (×3): 20 [IU] via SUBCUTANEOUS
  Filled 2014-01-30 (×3): qty 0.2

## 2014-01-30 MED ORDER — POTASSIUM CHLORIDE CRYS ER 20 MEQ PO TBCR
40.0000 meq | EXTENDED_RELEASE_TABLET | Freq: Once | ORAL | Status: DC
  Filled 2014-01-30: qty 2

## 2014-01-30 NOTE — Progress Notes (Signed)
[facility] PROGRESS NOTE  RUIE SENDEJO HQI:[id number] DOB: Jul 05, 1941 DOA: 01/29/2014 PCP: Kelton Pillar, MD  Assessment/Plan: 73 y/o female with with PMH of DM, HTN, myeloproliferative disorder (follows Dr. Alvy Bimler) on intermittent phlebotomy, gout, hypertension presented to med center [facility] for persistent dizziness, diarrhea and confusion (recently competed atx for abscess) found to have UTI, c diff   1. Sepsis/c diff with n/v/d; recent atx exposure  -started PO vanc; IVF, antiemetics; cont monitoring:  2. UTI, started empiric atx, pend cultures;   3.  Myeloproliferative disorder (follows Dr. 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 Hypo K; replace, recheck in AM   5. IDDM; uncontrolled; HA1C-pend (last 12.2) -resume insulin regimen; titrate pre response   6. HTN hold ACE with severe diarrhea, volume loss;    Code Status: full Family Communication: d/w patient, her husband  (indicate person spoken with, relationship, and if by phone, the number) Disposition Plan: home pend clinical improvement    Consultants:  nonen   Procedures:  None   Antibiotics:  vanc PO 4/15<<<<  ceftriaxone 4/14<<<<   (indicate start date, and stop date if known)  HPI/Subjective: alert  Objective: Filed Vitals:   01/30/14 0614  BP: 118/72  Pulse:   Temp:   Resp:     Intake/Output Summary (Last 24 hours) at 01/30/14 1132 Last data filed at 01/30/14 0801  Gross per 24 hour  Intake    240 ml  Output    800 ml  Net   -560 ml   Filed Weights   01/29/14 1817  Weight: 87 kg (191 lb 12.8 oz)    Exam:   General:  alert  Cardiovascular: s1,s2 rrr  Respiratory: CTA BL  Abdomen: soft, nt,nd   Musculoskeletal: no LE edema   Data Reviewed: Basic Metabolic Panel:  Recent Labs Lab 01/28/14 1345 01/29/14 1203  01/29/14 1921 01/30/14 0439  NA 131* 134*  --  141  K 4.0 4.0  --  3.1*  CL 93* 97  --  106  CO2 21 23  --  25  GLUCOSE 416* 456*  --  131*  BUN 6 9  --  8  CREATININE 0.54 0.70 0.61 0.64  CALCIUM 9.5 9.0  --  8.5   Liver Function Tests:  Recent Labs Lab 01/28/14 1345 01/29/14 1203  AST 19 28  ALT 12 10  ALKPHOS 121* 96  BILITOT 1.0 0.8  PROT 7.0 6.3  ALBUMIN 3.7 3.4*    Recent Labs Lab 01/28/14 1345  LIPASE 24   No results found for this basename: AMMONIA,  in the last 168 hours CBC:  Recent Labs Lab 01/28/14 1345 01/29/14 1203 01/29/14 1921 01/30/14 0439  WBC 18.0* 13.0* 12.9* 14.5*  NEUTROABS 15.6* 9.7*  --   --   HGB 15.8* 14.8 14.7 13.7  HCT 45.0 43.2 42.0 39.3  MCV 89.3 92.5 89.6 89.7  PLT 629* 509* 500* 468*   Cardiac Enzymes:  Recent Labs Lab 01/29/14 1203  TROPONINI <0.30   BNP (last 3 results)  Recent Labs  01/29/14 1203  PROBNP 311.6*   CBG:  Recent Labs Lab 01/29/14 1842 01/29/14 2155 01/30/14 0719  GLUCAP 268* 209* 145*    Recent Results (from the past 240 hour(s))  URINE CULTURE     Status: None   Collection Time    01/29/14  2:49 PM      Result  Value Ref Range Status   Specimen Description URINE, CLEAN CATCH   Final   Special Requests NONE   Final   Culture  Setup Time     Final   Value: 01/29/2014 16:25     Performed at [facility]     Final   Value: >=100,000 COLONIES/ML     Performed at [facility]   Culture     Final   Value: ESCHERICHIA COLI     Performed at [facility]   Report Status PENDING   Incomplete  CLOSTRIDIUM DIFFICILE BY PCR     Status: Abnormal   Collection Time    01/29/14  8:28 PM      Result Value Ref Range Status   C difficile by pcr POSITIVE (*) NEGATIVE Final   Comment: CRITICAL RESULT CALLED TO, READ BACK BY AND VERIFIED WITH:     D.BLOCK,RN 01/30/14 0910 BY BSLADE     Performed at [facility]     Studies: Dg Chest 2  View  01/29/2014   CLINICAL DATA:  Dizziness and dehydration. Recent food poisoning over the weekend.  EXAM: CHEST  2 VIEW  COMPARISON:  Prior chest x-ray 05/31/2013  FINDINGS: Stable cardiac and mediastinal contours. Slightly increased interstitial prominence in a predominantly perihilar distribution in both mid lungs. There is pulmonary vascular congestion with slight cephalization. Overall, the findings are most consistent with mild interstitial pulmonary edema. No pleural effusion or pneumothorax. No focal airspace consolidation. The lungs are hyperexpanded on the lateral view and there is central airway thickening. No acute osseous abnormality.  IMPRESSION: Increased perihilar interstitial prominence is most suggestive of mild pulmonary interstitial edema. Atypical or viral respiratory infection could have a similar appearance in the appropriate clinical setting.   Electronically Signed   By: Jacqulynn Cadet M.D.   On: 01/29/2014 13:13   Ct Head Wo Contrast  01/29/2014   CLINICAL DATA:  Dizziness  EXAM: CT HEAD WITHOUT CONTRAST  TECHNIQUE: Contiguous axial images were obtained from the base of the skull through the vertex without intravenous contrast. Study was obtained within 24 hr of patient's arrival at the emergency department.  COMPARISON:  October 24, 2012  FINDINGS: The ventricles are mildly prominent but stable. The left lateral ventricle is slightly larger than the right lateral ventricle, an anatomic variant. The sulci appear within normal limits for age. There is no mass, hemorrhage, extra-axial fluid collection, or midline shift. Mild small vessel disease in the centra semiovale is stable. There is no new gray-white compartment lesion. No acute infarct. Bony calvarium appears intact. The mastoid air cells are clear.  IMPRESSION: Age related volume loss. Mild periventricular small vessel disease. No intracranial mass, hemorrhage, or acute appearing infarct.   Electronically Signed   By: Lowella Grip M.D.   On: 01/29/2014 13:13   Mr Brain Wo Contrast  01/29/2014   CLINICAL DATA:  Dizziness and fusion.  Diabetes and hypertension.  EXAM: MRI HEAD WITHOUT CONTRAST  TECHNIQUE: Multiplanar, multiecho pulse sequences of the brain and surrounding structures were obtained without intravenous contrast.  COMPARISON:  Head CT same day  FINDINGS: Diffusion imaging does not show any acute or subacute infarction. The brainstem and cerebellum are unremarkable. The cerebral hemispheres show mild age related atrophy with mild chronic small-vessel change of the deep white matter, less than often seen in healthy individuals of this age. No cortical or large vessel territory infarction. Asymmetry of the occipital horns of lateral  ventricles is a normal variant. No mass lesion, hemorrhage, obstructive hydrocephalus or extra-axial collections. There is fluid throughout the mastoid air cells on the right, which could possibly relate to dizziness. There is a hypercellular marrow pattern, focally prominent in the right parietal region. By history the patient has a myeloproliferative disorder and this probably relates to that.  IMPRESSION: No acute brain pathology. Mild age related atrophy and chronic small vessel change, less than often seen in healthy individuals of this age.  Fluid in the mastoid air cells on the right to that could possibly relate to dizziness.  Abnormal marrow signal diffusely, with focal abnormality in the right parietal region. By history, the patient has some sort of myeloproliferative disorder in this probably relates to that diagnosis.   Electronically Signed   By: Nelson Chimes M.D.   On: 01/29/2014 21:36   Ct Abdomen Pelvis W Contrast  01/28/2014   CLINICAL DATA:  Nausea and vomiting  EXAM: CT ABDOMEN AND PELVIS WITH CONTRAST  TECHNIQUE: Multidetector CT imaging of the abdomen and pelvis was performed using the standard protocol following bolus administration of intravenous contrast.  CONTRAST:   124m OMNIPAQUE IOHEXOL 300 MG/ML  SOLN  COMPARISON:  CT PELVIS W/CM dated 09/14/2013  FINDINGS: The contour of the liver is nodular. The left lobe is hypertrophied. These findings are compatible with cirrhotic change. There is low-density throughout the liver compatible with coexisting diffuse hepatic steatosis.  Splenomegaly. Multiple hypodensities are scattered in the upper spleen. They are present at the inferior tip of the spleen of unknown significance.  Dependent atelectasis at the lung bases.  The pancreas, adrenal glands are within normal limits. Gallbladder is somewhat distended without obvious inflammatory change  Kidneys are within normal limits.  Small and large bowel are both decompressed.  Advanced atherosclerotic change of the aorta and iliac vasculature.  Bladder is mildly distended  Uterus and adnexa are within normal limits.  Degenerative changes in the lower lumbar spine.  No free-fluid. No abnormal retroperitoneal adenopathy. Small para-aortic nodes are noted. Small portacaval node. Small gastrohepatic ligament nodes.  IMPRESSION: Cirrhotic liver.  Nonspecific hypodensities throughout the spleen. These are nonspecific and may represent represent infarcts or an inflammatory process.   Electronically Signed   By: AMaryclare BeanM.D.   On: 01/28/2014 16:56    Scheduled Meds: . aspirin EC  81 mg Oral Daily  . cefTRIAXone (ROCEPHIN)  IV  1 g Intravenous Q24H  . enoxaparin (LOVENOX) injection  40 mg Subcutaneous Q24H  . hydroxyurea  500 mg Oral BID  . insulin aspart  0-20 Units Subcutaneous TID WC  . insulin aspart  0-5 Units Subcutaneous QHS  . insulin aspart protamine- aspart  30 Units Subcutaneous TID PC  . insulin glargine  20 Units Subcutaneous QHS  . lisinopril  40 mg Oral Daily  . saccharomyces boulardii  250 mg Oral BID  . vancomycin  125 mg Oral QID   Continuous Infusions: . sodium chloride 100 mL/hr at 01/30/14 09371   Principal Problem:   UTI (lower urinary tract  infection) Active Problems:   Essential hypertension, benign   Type II or unspecified type diabetes mellitus without mention of complication, uncontrolled   Dizziness   Diarrhea   Dehydration    Time spent: >35 minutes     UKinnie Feil [facility] Pager [phone] If 7PM-7AM, please contact night-coverage at [email or web address], password [email or web address]4/15/2015, 11:32 AM  LOS: 1 day

## 2014-01-30 NOTE — Clinical Documentation Improvement (Signed)
Possible Clinical Conditions?   Acute Pulmonary Edema                             Other Condition           Supporting Information: Risk Factors: pmh: Diastolic dysfunction  Diagnostics: Stable cardiac and mediastinal contours. Slightly increased  interstitial prominence in a predominantly perihilar distribution in  both mid lungs. There is pulmonary vascular congestion with slight  cephalization. Overall, the findings are most consistent with mild  interstitial pulmonary edema. No pleural effusion or pneumothorax.  No focal airspace consolidation. The lungs are hyperexpanded on the  lateral view and there is central airway thickening. No acute  osseous abnormality.  IMPRESSION:  Increased perihilar interstitial prominence is most suggestive of  mild pulmonary interstitial edema. Atypical or viral respiratory  infection could have a similar appearance in the appropriate  clinical setting.  Thank You, Joya Salm ,RN Clinical Documentation Specialist:  [employer] [place] [employer]

## 2014-01-30 NOTE — Progress Notes (Signed)
Patient complaining of area on right calf, that is red and tender.  Patient currently not wanting anything for the pain.  Will notify the attending physician.  Durwin Nora RN

## 2014-01-30 NOTE — Progress Notes (Signed)
OT Cancellation Note  Patient Details Name: Amanda [name] MRN: [id number] DOB: 03-04-1941   Cancelled Treatment:    Reason Eval/Treat Not Completed: Patient declined, having constant diarrhea and dizziness and did not want to participate today. Will check back next day.  Paytin Ramakrishnan A Abdulmalik Darco 01/30/2014, 1:55 PM

## 2014-01-30 NOTE — Progress Notes (Signed)
Nutrition Brief Note  Patient identified on the Malnutrition Screening Tool (MST) Report  Wt Readings from Last 15 Encounters:  01/29/14 191 lb 12.8 oz (87 kg)  01/29/14 192 lb (87.091 kg)  01/21/14 191 lb (86.637 kg)  12/10/13 194 lb 3.2 oz (88.089 kg)  11/19/13 194 lb 6.4 oz (88.179 kg)  11/09/13 200 lb 9.6 oz (90.992 kg)  11/05/13 197 lb 9.6 oz (89.631 kg)  11/01/13 193 lb 6.4 oz (87.726 kg)  10/30/13 194 lb (87.998 kg)  10/24/13 196 lb (88.905 kg)  10/23/13 196 lb (88.905 kg)  09/14/13 196 lb (88.905 kg)  08/21/13 206 lb (93.441 kg)  08/20/13 205 lb 8 oz (93.214 kg)  08/13/13 206 lb (93.441 kg)    Body mass index is 32.43 kg/(m^2). Patient meets criteria for Obesity I based on current BMI.   Current diet order is Carb Modified, patient is consuming approximately 95% of meals at this time. Labs and medications reviewed.   Pt reported decreased appetite for past 4-5 days d/t abd pain, nausea and loose stools. Pt positive for C.diff. Encouraged intake of bananas, rice, applesauce, oatmeal to assist in forming stools. Eating well, >75% of meals. Pt denied unintentional wt loss, was interested in learning ways to lose weight/DM2 diet recommendations once pt with improvement in stools  No nutrition interventions warranted at this time. If nutrition issues arise, please consult RD. Would benefit from weight management/DM2 diet consult when medically stable  Atlee Abide [place] Bells Clinical Dietitian [phone]

## 2014-01-31 ENCOUNTER — Inpatient Hospital Stay (HOSPITAL_COMMUNITY): Payer: Medicare HMO

## 2014-01-31 DIAGNOSIS — D751 Secondary polycythemia: Secondary | ICD-10-CM

## 2014-01-31 DIAGNOSIS — R0602 Shortness of breath: Secondary | ICD-10-CM

## 2014-01-31 LAB — CBC
HCT: 40.5 % (ref 36.0–46.0)
Hemoglobin: 13.8 g/dL (ref 12.0–15.0)
MCH: 31 pg (ref 26.0–34.0)
MCHC: 34.1 g/dL (ref 30.0–36.0)
MCV: 91 fL (ref 78.0–100.0)
Platelets: 459 10*3/uL — ABNORMAL HIGH (ref 150–400)
RBC: 4.45 MIL/uL (ref 3.87–5.11)
RDW: 13.4 % (ref 11.5–15.5)
WBC: 11.8 10*3/uL — AB (ref 4.0–10.5)

## 2014-01-31 LAB — GLUCOSE, CAPILLARY
GLUCOSE-CAPILLARY: 131 mg/dL — AB (ref 70–99)
GLUCOSE-CAPILLARY: 144 mg/dL — AB (ref 70–99)
Glucose-Capillary: 106 mg/dL — ABNORMAL HIGH (ref 70–99)

## 2014-01-31 LAB — BASIC METABOLIC PANEL
BUN: 7 mg/dL (ref 6–23)
CHLORIDE: 109 meq/L (ref 96–112)
CO2: 24 meq/L (ref 19–32)
Calcium: 8.5 mg/dL (ref 8.4–10.5)
Creatinine, Ser: 0.6 mg/dL (ref 0.50–1.10)
GFR calc Af Amer: >90 mL/min (ref >90)
GFR calc non Af Amer: 88 mL/min — ABNORMAL LOW (ref >90)
GLUCOSE: 105 mg/dL — AB (ref 70–99)
POTASSIUM: 3.5 meq/L — AB (ref 3.7–5.3)
Sodium: 143 mEq/L (ref 137–147)

## 2014-01-31 LAB — URINE CULTURE
Colony Count: >=100000
Colony Count: 30000

## 2014-01-31 LAB — HEMOGLOBIN A1C
Hgb A1c MFr Bld: 13.5 % — ABNORMAL HIGH (ref <5.7)
MEAN PLASMA GLUCOSE: 341 mg/dL — AB (ref <117)

## 2014-01-31 NOTE — Progress Notes (Signed)
[facility] PROGRESS NOTE  Amanda [name] ASN:[id number] DOB: 1941-09-14 DOA: 01/29/2014 PCP: Kelton Pillar, MD  Assessment/Plan: 73 y/o female with with PMH of DM, HTN, myeloproliferative disorder (follows Dr. Alvy Bimler) on intermittent phlebotomy, gout, hypertension presented to med center [facility] for persistent dizziness, diarrhea and confusion (recently competed atx for abscess) found to have UTI, c diff   1. Sepsis/c diff with n/v/d; recent atx exposure  -improving cont PO vanc; IVF, antiemetics; cont monitoring:  2. UTI, started empiric atx, +E coli, pend sens   3.  Myeloproliferative disorder (follows Dr. 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 Hypo K; replaced   5. IDDM; uncontrolled; HA1C-pend (last 12.2) -resume insulin regimen; titrate pre response   6. HTN hold ACE with severe diarrhea, volume loss;   7. CXR mild congestion, no s/s of CHF; likely viral infection; no wheezing or rhonchi on exam; cont monitoring   Code Status: full Family Communication: d/w patient, her husband  (indicate person spoken with, relationship, and if by phone, the number) Disposition Plan: home pend clinical improvement    Consultants:  nonen   Procedures:  None   Antibiotics:  vanc PO 4/15<<<<  ceftriaxone 4/14<<<<   (indicate start date, and stop date if known)  HPI/Subjective: alert  Objective: Filed Vitals:   01/31/14 0620  BP: 124/86  Pulse:   Temp:   Resp:     Intake/Output Summary (Last 24 hours) at 01/31/14 0927 Last data filed at 01/31/14 0500  Gross per 24 hour  Intake 928.33 ml  Output      0 ml  Net 928.33 ml   Filed Weights   01/29/14 1817  Weight: 87 kg (191 lb 12.8 oz)    Exam:   General:  alert  Cardiovascular: s1,s2 rrr  Respiratory: CTA BL  Abdomen: soft, nt,nd   Musculoskeletal:  no LE edema   Data Reviewed: Basic Metabolic Panel:  Recent Labs Lab 01/28/14 1345 01/29/14 1203 01/29/14 1921 01/30/14 0439 01/31/14 0423  NA 131* 134*  --  141 143  K 4.0 4.0  --  3.1* 3.5*  CL 93* 97  --  106 109  CO2 21 23  --  25 24  GLUCOSE 416* 456*  --  131* 105*  BUN 6 9  --  8 7  CREATININE 0.54 0.70 0.61 0.64 0.60  CALCIUM 9.5 9.0  --  8.5 8.5   Liver Function Tests:  Recent Labs Lab 01/28/14 1345 01/29/14 1203  AST 19 28  ALT 12 10  ALKPHOS 121* 96  BILITOT 1.0 0.8  PROT 7.0 6.3  ALBUMIN 3.7 3.4*    Recent Labs Lab 01/28/14 1345  LIPASE 24   No results found for this basename: AMMONIA,  in the last 168 hours CBC:  Recent Labs Lab 01/28/14 1345 01/29/14 1203 01/29/14 1921 01/30/14 0439 01/31/14 0423  WBC 18.0* 13.0* 12.9* 14.5* 11.8*  NEUTROABS 15.6* 9.7*  --   --   --   HGB 15.8* 14.8 14.7 13.7 13.8  HCT 45.0 43.2 42.0 39.3 40.5  MCV 89.3 92.5 89.6 89.7 91.0  PLT 629* 509* 500* 468* 459*   Cardiac Enzymes:  Recent Labs Lab 01/29/14 1203  TROPONINI <0.30   BNP (last 3 results)  Recent Labs  01/29/14 1203  PROBNP 311.6*   CBG:  Recent Labs Lab 01/30/14 1138 01/30/14 1646 01/30/14 2007 01/31/14 0012 01/31/14  0836  GLUCAP 167* 136* 113* 106* 131*    Recent Results (from the past 240 hour(s))  URINE CULTURE     Status: None   Collection Time    01/29/14  2:49 PM      Result Value Ref Range Status   Specimen Description URINE, CLEAN CATCH   Final   Special Requests NONE   Final   Culture  Setup Time     Final   Value: 01/29/2014 16:25     Performed at [facility]     Final   Value: >=100,000 COLONIES/ML     Performed at [facility]   Culture     Final   Value: ESCHERICHIA COLI     Performed at [facility]   Report Status PENDING   Incomplete  URINE CULTURE     Status: None   Collection Time    01/29/14  8:18 PM      Result Value Ref Range Status   Specimen  Description URINE, CLEAN CATCH   Final   Special Requests NONE   Final   Culture  Setup Time     Final   Value: 01/30/2014 01:36     Performed at [facility]     Final   Value: 30,000 COLONIES/ML     Performed at [facility]   Culture     Final   Value: Multiple bacterial morphotypes present, none predominant. Suggest appropriate recollection if clinically indicated.     Performed at [facility]   Report Status 01/31/2014 FINAL   Final  CLOSTRIDIUM DIFFICILE BY PCR     Status: Abnormal   Collection Time    01/29/14  8:28 PM      Result Value Ref Range Status   C difficile by pcr POSITIVE (*) NEGATIVE Final   Comment: CRITICAL RESULT CALLED TO, READ BACK BY AND VERIFIED WITH:     D.BLOCK,RN 01/30/14 0910 BY BSLADE     Performed at [facility] EXAMINATION     Status: None   Collection Time    01/29/14  8:28 PM      Result Value Ref Range Status   Specimen Description STOOL   Final   Special Requests NONE   Final   Ova and parasites     Final   Value: NO OVA OR PARASITES SEEN     Performed at [facility]   Report Status 01/30/2014 FINAL   Final     Studies: Dg Chest 2 View  01/29/2014   CLINICAL DATA:  Dizziness and dehydration. Recent food poisoning over the weekend.  EXAM: CHEST  2 VIEW  COMPARISON:  Prior chest x-ray 05/31/2013  FINDINGS: Stable cardiac and mediastinal contours. Slightly increased interstitial prominence in a predominantly perihilar distribution in both mid lungs. There is pulmonary vascular congestion with slight cephalization. Overall, the findings are most consistent with mild interstitial pulmonary edema. No pleural effusion or pneumothorax. No focal airspace consolidation. The lungs are hyperexpanded on the lateral view and there is central airway thickening. No acute osseous abnormality.  IMPRESSION: Increased perihilar interstitial prominence is most suggestive of mild pulmonary  interstitial edema. Atypical or viral respiratory infection could have a similar appearance in the appropriate clinical setting.   Electronically Signed   By: Jacqulynn Cadet M.D.   On: 01/29/2014 13:13   Ct Head Wo Contrast  01/29/2014   CLINICAL  DATA:  Dizziness  EXAM: CT HEAD WITHOUT CONTRAST  TECHNIQUE: Contiguous axial images were obtained from the base of the skull through the vertex without intravenous contrast. Study was obtained within 24 hr of patient's arrival at the emergency department.  COMPARISON:  October 24, 2012  FINDINGS: The ventricles are mildly prominent but stable. The left lateral ventricle is slightly larger than the right lateral ventricle, an anatomic variant. The sulci appear within normal limits for age. There is no mass, hemorrhage, extra-axial fluid collection, or midline shift. Mild small vessel disease in the centra semiovale is stable. There is no new gray-white compartment lesion. No acute infarct. Bony calvarium appears intact. The mastoid air cells are clear.  IMPRESSION: Age related volume loss. Mild periventricular small vessel disease. No intracranial mass, hemorrhage, or acute appearing infarct.   Electronically Signed   By: Lowella Grip M.D.   On: 01/29/2014 13:13   Mr Brain Wo Contrast  01/29/2014   CLINICAL DATA:  Dizziness and fusion.  Diabetes and hypertension.  EXAM: MRI HEAD WITHOUT CONTRAST  TECHNIQUE: Multiplanar, multiecho pulse sequences of the brain and surrounding structures were obtained without intravenous contrast.  COMPARISON:  Head CT same day  FINDINGS: Diffusion imaging does not show any acute or subacute infarction. The brainstem and cerebellum are unremarkable. The cerebral hemispheres show mild age related atrophy with mild chronic small-vessel change of the deep white matter, less than often seen in healthy individuals of this age. No cortical or large vessel territory infarction. Asymmetry of the occipital horns of lateral ventricles is a  normal variant. No mass lesion, hemorrhage, obstructive hydrocephalus or extra-axial collections. There is fluid throughout the mastoid air cells on the right, which could possibly relate to dizziness. There is a hypercellular marrow pattern, focally prominent in the right parietal region. By history the patient has a myeloproliferative disorder and this probably relates to that.  IMPRESSION: No acute brain pathology. Mild age related atrophy and chronic small vessel change, less than often seen in healthy individuals of this age.  Fluid in the mastoid air cells on the right to that could possibly relate to dizziness.  Abnormal marrow signal diffusely, with focal abnormality in the right parietal region. By history, the patient has some sort of myeloproliferative disorder in this probably relates to that diagnosis.   Electronically Signed   By: Nelson Chimes M.D.   On: 01/29/2014 21:36    Scheduled Meds: . aspirin EC  81 mg Oral Daily  . cefTRIAXone (ROCEPHIN)  IV  1 g Intravenous Q24H  . enoxaparin (LOVENOX) injection  40 mg Subcutaneous Q24H  . hydroxyurea  500 mg Oral BID  . insulin aspart  0-20 Units Subcutaneous TID WC  . insulin aspart  0-5 Units Subcutaneous QHS  . insulin aspart protamine- aspart  30 Units Subcutaneous TID PC  . insulin glargine  20 Units Subcutaneous QHS  . potassium chloride  40 mEq Oral Once  . saccharomyces boulardii  250 mg Oral BID  . vancomycin  125 mg Oral QID   Continuous Infusions: . 0.9 % NaCl with KCl 20 mEq / L 100 mL/hr at 01/30/14 1313    Principal Problem:   UTI (lower urinary tract infection) Active Problems:   Essential hypertension, benign   Type II or unspecified type diabetes mellitus without mention of complication, uncontrolled   Dizziness   Diarrhea   Dehydration    Time spent: >35 minutes     Kinnie Feil  [facility] Pager [phone].  If 7PM-7AM, please contact night-coverage at [email or web address], password [facility] 01/31/2014,  9:27 AM  LOS: 2 days

## 2014-01-31 NOTE — Progress Notes (Signed)
OT Cancellation Note  Patient Details Name: ANASOFIA MICALLEF MRN: [id number] DOB: [birth year]   Cancelled Treatment:    Reason Eval/Treat Not Completed: Other (comment).  Pt doesn't feel well and wants to sleep. Will check back tomorrow.  Lesle Chris 01/31/2014, 2:46 PM Lesle Chris, OTR/L [phone] 01/31/2014

## 2014-01-31 NOTE — Progress Notes (Signed)
OT Cancellation Note  Patient Details Name: Amanda [name] MRN: [id number] DOB: Apr 02, 1941   Cancelled Treatment:    Reason Eval/Treat Not Completed: Other (comment) Pt currently getting ready to eat. Will try back as schedule permits.  [name] [name] [phone] 01/31/2014, 12:16 PM

## 2014-02-01 ENCOUNTER — Ambulatory Visit (INDEPENDENT_AMBULATORY_CARE_PROVIDER_SITE_OTHER): Payer: Medicare Other | Admitting: Ophthalmology

## 2014-02-01 ENCOUNTER — Encounter (HOSPITAL_COMMUNITY): Payer: Self-pay | Admitting: *Deleted

## 2014-02-01 DIAGNOSIS — L03317 Cellulitis of buttock: Secondary | ICD-10-CM

## 2014-02-01 DIAGNOSIS — E119 Type 2 diabetes mellitus without complications: Secondary | ICD-10-CM

## 2014-02-01 DIAGNOSIS — L02419 Cutaneous abscess of limb, unspecified: Secondary | ICD-10-CM

## 2014-02-01 DIAGNOSIS — L03119 Cellulitis of unspecified part of limb: Secondary | ICD-10-CM

## 2014-02-01 DIAGNOSIS — L0231 Cutaneous abscess of buttock: Secondary | ICD-10-CM

## 2014-02-01 LAB — COMPREHENSIVE METABOLIC PANEL
ALT: 10 U/L (ref 0–35)
AST: 28 U/L (ref 0–37)
Albumin: 3.2 g/dL — ABNORMAL LOW (ref 3.5–5.2)
Alkaline Phosphatase: 98 U/L (ref 39–117)
BILIRUBIN TOTAL: 0.6 mg/dL (ref 0.3–1.2)
BUN: 5 mg/dL — ABNORMAL LOW (ref 6–23)
CHLORIDE: 102 meq/L (ref 96–112)
CO2: 22 mEq/L (ref 19–32)
Calcium: 9.3 mg/dL (ref 8.4–10.5)
Creatinine, Ser: 0.55 mg/dL (ref 0.50–1.10)
GFR calc non Af Amer: >90 mL/min (ref >90)
GLUCOSE: 209 mg/dL — AB (ref 70–99)
Potassium: 4 mEq/L (ref 3.7–5.3)
Sodium: 139 mEq/L (ref 137–147)
Total Protein: 6.3 g/dL (ref 6.0–8.3)

## 2014-02-01 LAB — GLUCOSE, CAPILLARY
GLUCOSE-CAPILLARY: 140 mg/dL — AB (ref 70–99)
Glucose-Capillary: 102 mg/dL — ABNORMAL HIGH (ref 70–99)
Glucose-Capillary: 122 mg/dL — ABNORMAL HIGH (ref 70–99)
Glucose-Capillary: 266 mg/dL — ABNORMAL HIGH (ref 70–99)
Glucose-Capillary: 83 mg/dL (ref 70–99)
Glucose-Capillary: 180 mg/dL — ABNORMAL HIGH (ref 70–99)

## 2014-02-01 MED ORDER — LISINOPRIL 40 MG PO TABS
40.0000 mg | ORAL_TABLET | Freq: Every day | ORAL | Status: DC
  Administered 2014-02-01 – 2014-02-02 (×2): 40 mg via ORAL
  Filled 2014-02-01 (×2): qty 1

## 2014-02-01 MED ORDER — CETAPHIL MOISTURIZING EX LOTN
TOPICAL_LOTION | Freq: Every day | CUTANEOUS | Status: DC
  Administered 2014-02-01 – 2014-02-02 (×2): via TOPICAL
  Filled 2014-02-01: qty 473

## 2014-02-01 MED ORDER — HYDRALAZINE HCL 20 MG/ML IJ SOLN
20.0000 mg | Freq: Four times a day (QID) | INTRAMUSCULAR | Status: DC | PRN
  Administered 2014-02-01: 20 mg via INTRAVENOUS
  Filled 2014-02-01: qty 1

## 2014-02-01 MED ORDER — HYDRALAZINE HCL 20 MG/ML IJ SOLN
10.0000 mg | Freq: Once | INTRAMUSCULAR | Status: AC
  Administered 2014-02-01: 10 mg via INTRAVENOUS
  Filled 2014-02-01: qty 0.5

## 2014-02-01 NOTE — Evaluation (Signed)
Occupational Therapy Evaluation Patient Details Name: AREANA KOSANKE MRN: [id number] DOB: 10/07/1941 Today's Date: 02/01/2014    History of Present Illness pt was admitted with UTI. she has a h/o DM, HTN and myeloproliferation disorder   Clinical Impression   Pt was admitted with the above.  She reports dizziness is gone.  She needs occasional min A for ADLs.  She ambulated to bathroom with min guard, a little unsteady but no LOB.  She has 24/7 assistance at home.  No further OT is needed.    Follow Up Recommendations  No OT follow up;Supervision/Assistance - 24 hour    Equipment Recommendations  None recommended by OT (pt does not want shower seat)    Recommendations for Other Services       Precautions / Restrictions Precautions Precautions: Fall Restrictions Weight Bearing Restrictions: No      Mobility Bed Mobility Overal bed mobility: Modified Independent             General bed mobility comments: hob raised  Transfers Overall transfer level: Modified independent Equipment used: None                  Balance                                            ADL Overall ADL's : Needs assistance/impaired     Grooming: Wash/dry hands;Standing;Supervision/safety       Lower Body Bathing: Minimal assistance;Sit to/from stand       Lower Body Dressing: Minimal assistance;Sit to/from stand   Toilet Transfer: Min guard;Ambulation;Comfort height toilet       Tub/ Shower Transfer: Min guard;Tub transfer;Grab bars (simulated)   Functional mobility during ADLs: Min guard General ADL Comments: pt feels like she is getting back to baseline.  Educated on energy conservation.  She typically sits at edge of tub to soap up then stands to rinse with hand held shower.  Recommended shower seat, which she doesn't want or just standing and washing what she can then sitting on commode for rest for increased safety.  Husband is with her most of the  time.     Vision                     Perception     Praxis      Pertinent Vitals/Pain No pain     Hand Dominance     Extremity/Trunk Assessment Upper Extremity Assessment Upper Extremity Assessment: Overall WFL for tasks assessed           Communication Communication Communication: No difficulties   Cognition Arousal/Alertness: Awake/alert Behavior During Therapy: WFL for tasks assessed/performed Overall Cognitive Status: Within Functional Limits for tasks assessed                     General Comments       Exercises       Shoulder Instructions      Home Living Family/patient expects to be discharged to:: Private residence Living Arrangements: Spouse/significant other Available Help at Discharge: Family Type of Home: Apartment Home Access: Level entry     Home Layout: One level     Bathroom Shower/Tub: Teacher, early years/pre: Handicapped height     Home Equipment: Grab bars - tub/shower          Prior Functioning/Environment Level of Independence:  Independent             OT Diagnosis:     OT Problem List:     OT Treatment/Interventions:      OT Goals(Current goals can be found in the care plan section)    OT Frequency:     Barriers to D/C:            Co-evaluation              End of Session    Activity Tolerance: Patient tolerated treatment well Patient left: with bed alarm set (eob)   Time: 8891-6945 OT Time Calculation (min): 19 min Charges:  OT General Charges $OT Visit: 1 Procedure OT Evaluation $Initial OT Evaluation Tier I: 1 Procedure OT Treatments $Self Care/Home Management : 8-22 mins G-Codes:    Lesle Chris 02-02-2014, 8:38 AM Lesle Chris, OTR/L [phone] 02/02/2014

## 2014-02-01 NOTE — Consult Note (Signed)
WOC wound consult note Reason for Consult: RLE wounds. Pt is some what agitated with me at the time of my assessment.  Has impression I am from infectious disease.  I have explained my role and my scope. She reports she has had these type of lesions before related to her diabetes.  I have assessed her feet and she does not have an s/s of neuropathic foot ulcers at this time.  She does have two intact bulla on the RLE medial that are tender to palpation but have no drainage, and are not open. They are both dark purple in color, appear to be blood filled with no induration and on a slight halo of erythema  at each site. She continues to mention two other spots on her LLE, which I do not a few very tiny areas that are each at the base of a hair follicle and it is very apparent she has been scratching this area. I questioned her on product use on her skin, new meds, new foods, she denies any of this prior to her admission. She reports quite a bit of pain and reports she just wants "to rip the skin off"   Wound type:unclear etiology, lesions on the RLE Measurement: 1 area 1.0cm x 1.5cm x 0 and adjacent lesion 0.5cm x 0.5cm x 0 Wound JOI:TGPQDI, blood filled bulla and papule Drainage (amount, consistency, odor) none Periwound:intact, as above Dressing procedure/placement/frequency: no topical care at this time. Ok to use over the counter cetaphil lotion to sooth skin, I have ordered while inpatient.   Would encourage consultation with dermatology for these lesions and then routine follow up as she apparently has multiple skin issues from time to time.   Discussed POC with patient and bedside nurse.  Re consult if needed, will not follow at this time. Thanks  Pierre Dellarocco [place], [place] [phone])

## 2014-02-01 NOTE — Progress Notes (Signed)
[facility] PROGRESS NOTE  Amanda [name] XTA:[id number] DOB: 01-21-1941 DOA: 01/29/2014 PCP: Kelton Pillar, MD  Assessment/Plan: 73 y/o female with with PMH of DM, HTN, myeloproliferative disorder (follows Dr. Alvy Bimler) on intermittent phlebotomy, gout, hypertension presented to med center [facility] for persistent dizziness, diarrhea and confusion (recently competed atx for abscess) found to have UTI, c diff   1. Sepsis/c diff with n/v/d; recent atx exposure  -improving cont PO vanc; IVF, antiemetics; cont monitoring:  2. UTI, started empiric atx, +E coli, improving on atx   3.  Myeloproliferative disorder (follows Dr. 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 Hypo K; replaced    5. IDDM; uncontrolled; HA1C-pend (last 12.2) -cont insulin regimen; titrate pre response   6. HTN resume ACE;   7. CXR mild congestion, no s/s of CHF; likely viral infection; no wheezing or rhonchi on exam; cont monitoring   Code Status: full Family Communication: d/w patient, her husband  (indicate person spoken with, relationship, and if by phone, the number) Disposition Plan: home pend clinical improvement likely in AM     Consultants:  nonen   Procedures:  None   Antibiotics:  vanc PO 4/15<<<<  ceftriaxone 4/14<<<<   (indicate start date, and stop date if known)  HPI/Subjective: alert  Objective: Filed Vitals:   02/01/14 0708  BP: 121/86  Pulse: 101  Temp: 98.3 F (36.8 C)  Resp: 16    Intake/Output Summary (Last 24 hours) at 02/01/14 0907 Last data filed at 02/01/14 0858  Gross per 24 hour  Intake    770 ml  Output      0 ml  Net    770 ml   Filed Weights   01/29/14 1817  Weight: 87 kg (191 lb 12.8 oz)     Exam:   General:  alert  Cardiovascular: s1,s2 rrr  Respiratory: CTA BL  Abdomen: soft, nt,nd   Musculoskeletal: no LE edema   Data Reviewed: Basic Metabolic Panel:  Recent Labs Lab 01/28/14 1345 01/29/14 1203 01/29/14 1921 01/30/14 0439 01/31/14 0423  NA 131* 134*  --  141 143  K 4.0 4.0  --  3.1* 3.5*  CL 93* 97  --  106 109  CO2 21 23  --  25 24  GLUCOSE 416* 456*  --  131* 105*  BUN 6 9  --  8 7  CREATININE 0.54 0.70 0.61 0.64 0.60  CALCIUM 9.5 9.0  --  8.5 8.5   Liver Function Tests:  Recent Labs Lab 01/28/14 1345 01/29/14 1203  AST 19 28  ALT 12 10  ALKPHOS 121* 96  BILITOT 1.0 0.8  PROT 7.0 6.3  ALBUMIN 3.7 3.4*    Recent Labs Lab 01/28/14 1345  LIPASE 24   No results found for this basename: AMMONIA,  in the last 168 hours CBC:  Recent Labs Lab 01/28/14 1345 01/29/14 1203 01/29/14 1921 01/30/14 0439 01/31/14 0423  WBC 18.0* 13.0* 12.9* 14.5* 11.8*  NEUTROABS 15.6* 9.7*  --   --   --   HGB 15.8* 14.8 14.7 13.7 13.8  HCT 45.0 43.2 42.0 39.3 40.5  MCV 89.3 92.5 89.6 89.7 91.0  PLT 629* 509* 500* 468* 459*   Cardiac Enzymes:  Recent Labs Lab 01/29/14 1203  TROPONINI <0.30   BNP (  last 3 results)  Recent Labs  01/29/14 1203  PROBNP 311.6*   CBG:  Recent Labs Lab 01/31/14 0836 01/31/14 1216 01/31/14 1702 01/31/14 2136 02/01/14 0754  GLUCAP 131* 144* 140* 102* 122*    Recent Results (from the past 240 hour(s))  URINE CULTURE     Status: None   Collection Time    01/29/14  2:49 PM      Result Value Ref Range Status   Specimen Description URINE, CLEAN CATCH   Final   Special Requests NONE   Final   Culture  Setup Time     Final   Value: 01/29/2014 16:25     Performed at [place]     Final   Value: >=100,000 COLONIES/ML     Performed at [facility]   Culture     Final   Value: ESCHERICHIA COLI     Performed at [facility]   Report Status 01/31/2014 FINAL    Final   Organism ID, Bacteria ESCHERICHIA COLI   Final  URINE CULTURE     Status: None   Collection Time    01/29/14  8:18 PM      Result Value Ref Range Status   Specimen Description URINE, CLEAN CATCH   Final   Special Requests NONE   Final   Culture  Setup Time     Final   Value: 01/30/2014 01:36     Performed at [place]     Final   Value: 30,000 COLONIES/ML     Performed at [facility]   Culture     Final   Value: Multiple bacterial morphotypes present, none predominant. Suggest appropriate recollection if clinically indicated.     Performed at [facility]   Report Status 01/31/2014 FINAL   Final  CLOSTRIDIUM DIFFICILE BY PCR     Status: Abnormal   Collection Time    01/29/14  8:28 PM      Result Value Ref Range Status   C difficile by pcr POSITIVE (*) NEGATIVE Final   Comment: CRITICAL RESULT CALLED TO, READ BACK BY AND VERIFIED WITH:     D.BLOCK,RN 01/30/14 0910 BY BSLADE     Performed at [place] EXAMINATION     Status: None   Collection Time    01/29/14  8:28 PM      Result Value Ref Range Status   Specimen Description STOOL   Final   Special Requests NONE   Final   Ova and parasites     Final   Value: NO OVA OR PARASITES SEEN     Performed at [facility]   Report Status 01/30/2014 FINAL   Final     Studies: US Abdomen Complete  01/31/2014   CLINICAL DATA:  Cirrhosis.  Splenomegaly.  Nausea and vomiting.  EXAM: ULTRASOUND ABDOMEN COMPLETE  COMPARISON:  CT scan dated 01/28/2014  FINDINGS: Gallbladder:  There are several tiny stones in the gallbladder. Gallbladder wall is slightly edematous, 5.6 mm. Negative sonographic Murphy's sign.  Common bile duct:  Diameter: Common bile duct is 9 mm in diameter, but there are no dilated intrahepatic ducts. The common duct appeared normal on the prior CT scan of 01/28/2014.  Liver:  No focal lesions. There is hepatomegaly with slight increased  echogenicity of the parenchyma.  IVC:  Normal.  Pancreas:  Normal.  Spleen:  Enlarged at 14.5 cm  in length.  Volume is 869 cubic cm.  Right Kidney:  Length: 12.1 cm. Echogenicity within normal limits. No mass or hydronephrosis visualized.  Left Kidney:  Length: 11.1 cm. Echogenicity within normal limits. No mass or hydronephrosis visualized.  Abdominal aorta:  2.3 cm maximum diameter.  Other findings:  None.  IMPRESSION: Hepatosplenomegaly.  Tiny stones in the gallbladder. Dilated common bile duct which was not present on the prior study of 01/28/2014. Has the patient has bilirubin increased since that time? The edema of the gallbladder wall may be due to the patient's low albumin.   Electronically Signed   By: Rozetta Nunnery M.D.   On: 01/31/2014 10:25    Scheduled Meds: . aspirin EC  81 mg Oral Daily  . cefTRIAXone (ROCEPHIN)  IV  1 g Intravenous Q24H  . enoxaparin (LOVENOX) injection  40 mg Subcutaneous Q24H  . hydroxyurea  500 mg Oral BID  . insulin aspart  0-20 Units Subcutaneous TID WC  . insulin aspart  0-5 Units Subcutaneous QHS  . insulin aspart protamine- aspart  30 Units Subcutaneous TID PC  . insulin glargine  20 Units Subcutaneous QHS  . lisinopril  40 mg Oral Daily  . potassium chloride  40 mEq Oral Once  . saccharomyces boulardii  250 mg Oral BID  . 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 If 7PM-7AM, please contact night-coverage at [email or web address], password [facility] 02/01/2014, 9:07 AM  LOS: 3 days

## 2014-02-02 MED ORDER — METRONIDAZOLE 500 MG PO TABS: 500.0000 mg | ORAL_TABLET | Freq: Three times a day (TID) | ORAL | Status: DC

## 2014-02-02 MED ORDER — INSULIN ISOPHANE & REGULAR (HUMAN 70-30)100 UNIT/ML KWIKPEN: 20.0000 [IU] | PEN_INJECTOR | Freq: Three times a day (TID) | SUBCUTANEOUS | Status: DC

## 2014-02-02 MED ORDER — SACCHAROMYCES BOULARDII 250 MG PO CAPS
250.0000 mg | ORAL_CAPSULE | Freq: Two times a day (BID) | ORAL | Status: DC
Start: 2014-02-02 — End: 2015-02-26

## 2014-02-02 MED ORDER — LEVOFLOXACIN 500 MG PO TABS: 500.0000 mg | ORAL_TABLET | Freq: Every day | ORAL | Status: DC

## 2014-02-02 MED ORDER — CETAPHIL MOISTURIZING EX LOTN: TOPICAL_LOTION | Freq: Every day | CUTANEOUS | Status: DC

## 2014-02-02 NOTE — Discharge Summary (Addendum)
Physician Discharge Summary  Amanda [name] OQH:[id number] DOB: 16-Oct-1941 DOA: 01/29/2014  PCP: Kelton Pillar, MD  Admit date: 01/29/2014 Discharge date: 02/02/2014  Time spent: >35 minutes  Recommendations for Outpatient Follow-up:  F/u with PCP in 1 week  Discharge Diagnoses:  Principal Problem:   UTI (lower urinary tract infection) Active Problems:   Essential hypertension, benign   Type II or unspecified type diabetes mellitus without mention of complication, uncontrolled   Dizziness   Diarrhea   Dehydration   Discharge Condition: stable   Diet recommendation: DM  Filed Weights   01/29/14 1817  Weight: 87 kg (191 lb 12.8 oz)    History of present illness:  73 y/o female with with PMH of DM, HTN, myeloproliferative disorder (follows Dr. Alvy Bimler) on intermittent phlebotomy, gout, hypertension presented to med center [facility] for persistent dizziness, diarrhea and confusion (recently competed atx for abscess) found to have UTI, c diff    Hospital Course:  1. Sepsis/c diff with n/v/d; recent atx exposure  -improving on PO vanc; diarrhea resolved, afebrile; changed to PO flagyl to complete the course   2. UTI +E coli, improved on atx  3. Myeloproliferative disorder (follows Dr. 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 Hypo K; replaced  5. IDDM; uncontrolled; HA1C >12  -patient reports being noncompliant; d/w patient, her husband about alternative options like lantus; they preferred to f/u with PCP outpatient to d/w DM management -will resume home insulin regimen, reemphasized adherence to meds  6. HTN resume ACE;  7. CXR mild congestion, no s/s of CHF;  likely viral infection; no wheezing or rhonchi on exam;  8. Small erythema small subcutaneous hematoma in the shin area, but no s/s of infection, or inflammation or abscess; recommend to cont outpatient follow up with PCP     Procedures:  none (i.e. Studies not automatically included, echos, thoracentesis, etc; not x-rays)  Consultations:  none  Discharge Exam: Filed Vitals:   02/02/14 0600  BP: 162/66  Pulse: 87  Temp: 98.7 F (37.1 C)  Resp: 18    General: alert Cardiovascular: s1,s2 rrr Respiratory: CTA BL  Discharge Instructions  Discharge Orders   Future Appointments Provider Department Dept Phone   04/04/2014 9:00 AM [facility] [phone]   04/04/2014 9:30 AM [name], MD [facility] [phone]   Future Orders Complete By Expires   Diet - low sodium heart healthy  As directed    Discharge instructions  As directed    Increase activity slowly  As directed        Medication List         aspirin EC 81 MG tablet  Take 81 mg by mouth daily.     cetaphil lotion  Apply topically daily.     HYDROcodone-acetaminophen 5-325 MG per tablet  Commonly known as:  NORCO/VICODIN  Take 2 tablets by mouth every 4 (four) hours as needed for moderate pain.     hydrocortisone 2.5 % cream  Apply 1 application topically 2 (two) times daily.     hydroxyurea 500 MG capsule  Commonly known as:  HYDREA  Take 1 capsule (500 mg total) by mouth 2 (two) times  daily.     ibuprofen 200 MG tablet  Commonly known as:  ADVIL,MOTRIN  Take 200 mg by mouth every 6 (six) hours as needed for moderate pain.     Insulin Isophane & Regular Human (70-30) 100 UNIT/ML PEN  Commonly known as:  HUMULIN 70/30 KWIKPEN  Inject 20-65 Units into the skin 3 (three) times daily after meals. 65 units at breakfast, 20 units at lunch, and 65 units at dinner     levofloxacin 500 MG tablet  Commonly known as:  LEVAQUIN   Take 1 tablet (500 mg total) by mouth daily.     lisinopril 40 MG tablet  Commonly known as:  PRINIVIL,ZESTRIL  Take 1 tablet (40 mg total) by mouth daily.     metroNIDAZOLE 500 MG tablet  Commonly known as:  FLAGYL  Take 1 tablet (500 mg total) by mouth 3 (three) times daily.     nystatin cream  Commonly known as:  MYCOSTATIN  Apply 1 application topically 2 (two) times daily.     ondansetron 8 MG tablet  Commonly known as:  ZOFRAN  Take 1 tablet (8 mg total) by mouth every 8 (eight) hours as needed for nausea or vomiting.     saccharomyces boulardii 250 MG capsule  Commonly known as:  FLORASTOR  Take 1 capsule (250 mg total) by mouth 2 (two) times daily.       Allergies  Allergen Reactions  . Tape Hives  . Doxycycline Hives, Swelling and Rash  . Latex Hives, Itching and Rash       Follow-up Information   Follow up with SCHOENHOFF,DEBBIE, MD In 1 week.   Specialty:  Internal Medicine   Contact information:   [place] [address] [phone]        The results of significant diagnostics from this hospitalization (including imaging, microbiology, ancillary and laboratory) are listed below for reference.    Significant Diagnostic Studies: Dg Chest 2 View  01/29/2014   CLINICAL DATA:  Dizziness and dehydration. Recent food poisoning over the weekend.  EXAM: CHEST  2 VIEW  COMPARISON:  Prior chest x-ray 05/31/2013  FINDINGS: Stable cardiac and mediastinal contours. Slightly increased interstitial prominence in a predominantly perihilar distribution in both mid lungs. There is pulmonary vascular congestion with slight cephalization. Overall, the findings are most consistent with mild interstitial pulmonary edema. No pleural effusion or pneumothorax. No focal airspace consolidation. The lungs are hyperexpanded on the lateral view and there is central airway thickening. No acute osseous abnormality.  IMPRESSION: Increased perihilar interstitial  prominence is most suggestive of mild pulmonary interstitial edema. Atypical or viral respiratory infection could have a similar appearance in the appropriate clinical setting.   Electronically Signed   By: Jacqulynn Cadet M.D.   On: 01/29/2014 13:13   Ct Head Wo Contrast  01/29/2014   CLINICAL DATA:  Dizziness  EXAM: CT HEAD WITHOUT CONTRAST  TECHNIQUE: Contiguous axial images were obtained from the base of the skull through the vertex without intravenous contrast. Study was obtained within 24 hr of patient's arrival at the emergency department.  COMPARISON:  October 24, 2012  FINDINGS: The ventricles are mildly prominent but stable. The left lateral ventricle is slightly larger than the right lateral ventricle, an anatomic variant. The sulci appear within normal limits for age. There is no mass, hemorrhage, extra-axial fluid collection, or midline shift. Mild small vessel disease in the centra semiovale is stable. There is no new gray-white compartment lesion. No  acute infarct. Bony calvarium appears intact. The mastoid air cells are clear.  IMPRESSION: Age related volume loss. Mild periventricular small vessel disease. No intracranial mass, hemorrhage, or acute appearing infarct.   Electronically Signed   By: Lowella Grip M.D.   On: 01/29/2014 13:13   Mr Brain Wo Contrast  01/29/2014   CLINICAL DATA:  Dizziness and fusion.  Diabetes and hypertension.  EXAM: MRI HEAD WITHOUT CONTRAST  TECHNIQUE: Multiplanar, multiecho pulse sequences of the brain and surrounding structures were obtained without intravenous contrast.  COMPARISON:  Head CT same day  FINDINGS: Diffusion imaging does not show any acute or subacute infarction. The brainstem and cerebellum are unremarkable. The cerebral hemispheres show mild age related atrophy with mild chronic small-vessel change of the deep white matter, less than often seen in healthy individuals of this age. No cortical or large vessel territory infarction. Asymmetry of  the occipital horns of lateral ventricles is a normal variant. No mass lesion, hemorrhage, obstructive hydrocephalus or extra-axial collections. There is fluid throughout the mastoid air cells on the right, which could possibly relate to dizziness. There is a hypercellular marrow pattern, focally prominent in the right parietal region. By history the patient has a myeloproliferative disorder and this probably relates to that.  IMPRESSION: No acute brain pathology. Mild age related atrophy and chronic small vessel change, less than often seen in healthy individuals of this age.  Fluid in the mastoid air cells on the right to that could possibly relate to dizziness.  Abnormal marrow signal diffusely, with focal abnormality in the right parietal region. By history, the patient has some sort of myeloproliferative disorder in this probably relates to that diagnosis.   Electronically Signed   By: Nelson Chimes M.D.   On: 01/29/2014 21:36   US Abdomen Complete  01/31/2014   CLINICAL DATA:  Cirrhosis.  Splenomegaly.  Nausea and vomiting.  EXAM: ULTRASOUND ABDOMEN COMPLETE  COMPARISON:  CT scan dated 01/28/2014  FINDINGS: Gallbladder:  There are several tiny stones in the gallbladder. Gallbladder wall is slightly edematous, 5.6 mm. Negative sonographic Murphy's sign.  Common bile duct:  Diameter: Common bile duct is 9 mm in diameter, but there are no dilated intrahepatic ducts. The common duct appeared normal on the prior CT scan of 01/28/2014.  Liver:  No focal lesions. There is hepatomegaly with slight increased echogenicity of the parenchyma.  IVC:  Normal.  Pancreas:  Normal.  Spleen:  Enlarged at 14.5 cm in length.  Volume is 869 cubic cm.  Right Kidney:  Length: 12.1 cm. Echogenicity within normal limits. No mass or hydronephrosis visualized.  Left Kidney:  Length: 11.1 cm. Echogenicity within normal limits. No mass or hydronephrosis visualized.  Abdominal aorta:  2.3 cm maximum diameter.  Other findings:  None.   IMPRESSION: Hepatosplenomegaly.  Tiny stones in the gallbladder. Dilated common bile duct which was not present on the prior study of 01/28/2014. Has the patient has bilirubin increased since that time? The edema of the gallbladder wall may be due to the patient's low albumin.   Electronically Signed   By: Rozetta Nunnery M.D.   On: 01/31/2014 10:25   Ct Abdomen Pelvis W Contrast  01/28/2014   CLINICAL DATA:  Nausea and vomiting  EXAM: CT ABDOMEN AND PELVIS WITH CONTRAST  TECHNIQUE: Multidetector CT imaging of the abdomen and pelvis was performed using the standard protocol following bolus administration of intravenous contrast.  CONTRAST:  166m OMNIPAQUE IOHEXOL 300 MG/ML  SOLN  COMPARISON:  CT PELVIS  W/CM dated 09/14/2013  FINDINGS: The contour of the liver is nodular. The left lobe is hypertrophied. These findings are compatible with cirrhotic change. There is low-density throughout the liver compatible with coexisting diffuse hepatic steatosis.  Splenomegaly. Multiple hypodensities are scattered in the upper spleen. They are present at the inferior tip of the spleen of unknown significance.  Dependent atelectasis at the lung bases.  The pancreas, adrenal glands are within normal limits. Gallbladder is somewhat distended without obvious inflammatory change  Kidneys are within normal limits.  Small and large bowel are both decompressed.  Advanced atherosclerotic change of the aorta and iliac vasculature.  Bladder is mildly distended  Uterus and adnexa are within normal limits.  Degenerative changes in the lower lumbar spine.  No free-fluid. No abnormal retroperitoneal adenopathy. Small para-aortic nodes are noted. Small portacaval node. Small gastrohepatic ligament nodes.  IMPRESSION: Cirrhotic liver.  Nonspecific hypodensities throughout the spleen. These are nonspecific and may represent represent infarcts or an inflammatory process.   Electronically Signed   By: Maryclare Bean M.D.   On: 01/28/2014 16:56     Microbiology: Recent Results (from the past 240 hour(s))  URINE CULTURE     Status: None   Collection Time    01/29/14  2:49 PM      Result Value Ref Range Status   Specimen Description URINE, CLEAN CATCH   Final   Special Requests NONE   Final   Culture  Setup Time     Final   Value: 01/29/2014 16:25     Performed at [place]     Final   Value: >=100,000 COLONIES/ML     Performed at [facility]   Culture     Final   Value: ESCHERICHIA COLI     Performed at [facility]   Report Status 01/31/2014 FINAL   Final   Organism ID, Bacteria ESCHERICHIA COLI   Final  URINE CULTURE     Status: None   Collection Time    01/29/14  8:18 PM      Result Value Ref Range Status   Specimen Description URINE, CLEAN CATCH   Final   Special Requests NONE   Final   Culture  Setup Time     Final   Value: 01/30/2014 01:36     Performed at [place]     Final   Value: 30,000 COLONIES/ML     Performed at [facility]   Culture     Final   Value: Multiple bacterial morphotypes present, none predominant. Suggest appropriate recollection if clinically indicated.     Performed at [facility]   Report Status 01/31/2014 FINAL   Final  CLOSTRIDIUM DIFFICILE BY PCR     Status: Abnormal   Collection Time    01/29/14  8:28 PM      Result Value Ref Range Status   C difficile by pcr POSITIVE (*) NEGATIVE Final   Comment: CRITICAL RESULT CALLED TO, READ BACK BY AND VERIFIED WITH:     D.BLOCK,RN 01/30/14 0910 BY BSLADE     Performed at [place] EXAMINATION     Status: None   Collection Time    01/29/14  8:28 PM      Result Value Ref Range Status   Specimen Description STOOL   Final   Special Requests NONE   Final   Ova and parasites  Final   Value: NO OVA OR PARASITES SEEN     Performed at [facility]   Report Status 01/30/2014 FINAL   Final     Labs: Basic Metabolic  Panel:  Recent Labs Lab 01/28/14 1345 01/29/14 1203 01/29/14 1921 01/30/14 0439 01/31/14 0423 02/01/14 0911  NA 131* 134*  --  141 143 139  K 4.0 4.0  --  3.1* 3.5* 4.0  CL 93* 97  --  106 109 102  CO2 21 23  --  _0 GLUCOSE 416* 456*  --  131* 105* 209*  BUN 6 9  --  8 7 5*  CREATININE 0.54 0.70 0.61 0.64 0.60 0.55  CALCIUM 9.5 9.0  --  8.5 8.5 9.3   Liver Function Tests:  Recent Labs Lab 01/28/14 1345 01/29/14 1203 02/01/14 0911  AST _1 ALT _2 ALKPHOS 121* 96 98  BILITOT 1.0 0.8 0.6  PROT 7.0 6.3 6.3  ALBUMIN 3.7 3.4* 3.2*    Recent Labs Lab 01/28/14 1345  LIPASE 24   No results found for this basename: AMMONIA,  in the last 168 hours CBC:  Recent Labs Lab 01/28/14 1345 01/29/14 1203 01/29/14 1921 01/30/14 0439 01/31/14 0423  WBC 18.0* 13.0* 12.9* 14.5* 11.8*  NEUTROABS 15.6* 9.7*  --   --   --   HGB 15.8* 14.8 14.7 13.7 13.8  HCT 45.0 43.2 42.0 39.3 40.5  MCV 89.3 92.5 89.6 89.7 91.0  PLT 629* 509* 500* 468* 459*   Cardiac Enzymes:  Recent Labs Lab 01/29/14 1203  TROPONINI <0.30   BNP: BNP (last 3 results)  Recent Labs  01/29/14 1203  PROBNP 311.6*   CBG:  Recent Labs Lab 01/31/14 2136 02/01/14 0754 02/01/14 1114 02/01/14 1651 02/01/14 2124  GLUCAP 102* 122* 266* 83 180*       Signed:  Fardowsa Authier N Claressa Hughley  [facility] 02/02/2014, 8:49 AM

## 2014-02-04 ENCOUNTER — Telehealth: Payer: Self-pay | Admitting: *Deleted

## 2014-02-04 LAB — GLUCOSE, CAPILLARY: GLUCOSE-CAPILLARY: 129 mg/dL — AB (ref 70–99)

## 2014-02-04 NOTE — Telephone Encounter (Signed)
Pt states that she was DC from the hospital and now has diarrhea again. Pt would like to know if she can have something for diarrhea OTC's is not working. pt would also like to know if she can cut flagyl in half. Pt has questions regarding other medications as well. Advised pt to bring her medications in to appt on Thursday so that she could discuss them with Dr Coralyn Mark

## 2014-02-04 NOTE — Telephone Encounter (Signed)
Amanda [name] call she has questions about some medication that the hospital put her on.

## 2014-02-05 ENCOUNTER — Telehealth: Payer: Self-pay | Admitting: Internal Medicine

## 2014-02-05 ENCOUNTER — Other Ambulatory Visit: Payer: Self-pay | Admitting: Internal Medicine

## 2014-02-05 ENCOUNTER — Encounter: Payer: Self-pay | Admitting: Internal Medicine

## 2014-02-05 ENCOUNTER — Ambulatory Visit (INDEPENDENT_AMBULATORY_CARE_PROVIDER_SITE_OTHER): Payer: Medicare HMO | Admitting: Internal Medicine

## 2014-02-05 ENCOUNTER — Telehealth: Payer: Self-pay | Admitting: *Deleted

## 2014-02-05 VITALS — BP 128/72 | HR 97 | Temp 98.7°F | Resp 12 | Wt 199.0 lb

## 2014-02-05 DIAGNOSIS — A0472 Enterocolitis due to Clostridium difficile, not specified as recurrent: Secondary | ICD-10-CM | POA: Insufficient documentation

## 2014-02-05 DIAGNOSIS — E1165 Type 2 diabetes mellitus with hyperglycemia: Principal | ICD-10-CM

## 2014-02-05 DIAGNOSIS — IMO0001 Reserved for inherently not codable concepts without codable children: Secondary | ICD-10-CM

## 2014-02-05 MED ORDER — INSULIN DETEMIR 100 UNIT/ML FLEXPEN: PEN_INJECTOR | SUBCUTANEOUS | Status: DC

## 2014-02-05 MED ORDER — INSULIN ASPART 100 UNIT/ML FLEXPEN: PEN_INJECTOR | SUBCUTANEOUS | Status: DC

## 2014-02-05 NOTE — Patient Instructions (Signed)
Please stop the insulin 70/30 and start: - Levemir 60 units at bedtime - NovoLog  20 units with a smaller meal 25 units with a larger meal Pleas continue in 1 month with your sugar log.

## 2014-02-05 NOTE — Telephone Encounter (Signed)
Called [name] to let him know that Dr Coralyn Mark will call him back this afternoon.  I also reminded him that Ad[place] has an appointment with Dr Cruzita Lederer, and he confirmed that they will be keeping it.

## 2014-02-05 NOTE — Telephone Encounter (Signed)
Amanda [name]  Print discharge summary and give to me .

## 2014-02-05 NOTE — Progress Notes (Signed)
Patient ID: Amanda [name], female   DOB: 09/14/1941, 73 y.o.   MRN: [id number]  HPI: Amanda [name] is a 73 y.o.-year-old female, returning for f/u for DM2, dx 2008, insulin-dependent since dx, uncontrolled, with complications (DR, PN). Last visit 1 mo ago. She is here accompanied by her husband.  Last hemoglobin A1c was: Lab Results  Component Value Date   HGBA1C 13.5* 01/31/2014  - in 10/16/2012, hemoglobin A1c was 6.9% - in 07/2012, hemoglobin A1c was 9.9% - in 11/16/2010, hemoglobin A1c was 12.8% - in 03/2011, hemoglobin A1c was 6.9% - In 07/10/2010 her hemoglobin A1c was 10.5% C-peptide was 6.3 and 01/2011.  Pt's current insulin regimen: NPH-regular insulin 70/30 under skin 50 units before breakfast, 20 units before lunch and 50 units before dinner. Cannot afford other insulins. She tells me she did not take the lunch insulin - she did not know she can keep the insulin in use out of the fridge and can take it with her.  Pt was on a regimen of: - NPH-regular (novolin) 70/30 - 58-58 units with meals - syringes  She could not tolerate doses of metformin XR higher than 250 mg twice a day in the past due to diarrhea. She does not want to resume this. Was previously on the Victoza 1.8 mg daily - "made me sick". She was previously on Janumet between 2012-2013. She was on Invokana - but could not afford it while in the Medicare doughnut hole. Per Dr. Darryl Nestle notes, patient was missing approximately 4 doses of insulin a week when eating out. Also, per his review of pharmacy records in 07/2012 this showed incomplete adherence to some or all of her medicines, although she claimed full burins other than missing lunchtime insulin doses half of the time.  Pt checks her sugars 1-3x a day and they are higher than before: - am: 190-304 >> 210-400 (lowest 140) - before lunch: 220-290 >> 200-300s - before dinner: 140-305 >> 200-300s No lows. Lowest sugar was 140 in last mo; she has hypoglycemia  awareness at 100. Highest sugar was 400s  She had a period of few days when she did not have insulin, and sugars were the same as when she was taking the insulin.  Pt's meals are - reviewed last note: - Breakfast: "I am not a breakfast eater": 1 egg + toast; 3 pancakes + sugarfree syrup; waffle; french toast - not skipping anymore  - Lunch: sometimes skips, sometimes eats out: pizza, salad, no dessert - Dinner: at 5:30-6pm: soup, meat + vegetables + starch, dessert - Snacks: 8 pm: fruit, sugar free  She is eating out 4 times a week. She was seen for nutritional advice and diabetes education, with last visit on 04/2012 - Amanda [name].  - no CKD, last BUN/creatinine:  Lab Results  Component Value Date   BUN 5* 02/01/2014   CREATININE 0.55 02/01/2014  She is on ACEI. Last microalbumin to creatinine ratio was 19.2 in 09/2012. - last set of lipids: Lab Results  Component Value Date   CHOL 167 06/01/2013   HDL 21* 06/01/2013   LDLCALC 91 06/01/2013   TRIG 275* 06/01/2013   CHOLHDL 8.0 06/01/2013  She is on Pravastatin. - last eye exam was in 07/2013. 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 At last eye exam (Dr Zigmund Daniel): cataracts. - no numbness and tingling in her feet. She is on aspirin 81 mg daily.  She also has a history of hypertension- on HCTZ, Metoprolol, also diastolic dysfunction,  hyperlipidemia, polycytemia vera - on hydroxyurea, gout, psoriasis, depression, history of Bell's palsy, obesity, vitamin D deficiency.  ROS: Constitutional: +weight gain, poor sleep Eyes: no blurry vision, no xerophthalmia ENT: no sore throat, no nodules palpated in throat, no dysphagia/odynophagia, no hoarseness; Tinnitus Cardiovascular: no CP/SOB/palpitations/+ leg and hand swelling Respiratory: +cough/no SOB Gastrointestinal: no N/V/+D/no C Musculoskeletal: no muscle/joint aches Skin: + rash - arms Neurological: no tremors/numbness/tingling/dizziness  I reviewed pt's medications,  allergies, PMH, social hx, family hx and no changes required, except as mentioned above.  PE: BP 128/72  Pulse 97  Temp(Src) 98.7 F (37.1 C) (Oral)  Resp 12  Wt 199 lb (90.266 kg)  SpO2 94% Wt Readings from Last 3 Encounters:  02/05/14 199 lb (90.266 kg)  01/29/14 191 lb 12.8 oz (87 kg)  01/29/14 192 lb (87.091 kg)   Constitutional: overweight, in NAD Eyes: PERRLA, EOMI, no exophthalmos ENT: moist mucous membranes, no thyromegaly, no cervical lymphadenopathy Cardiovascular: RRR, No MRG Respiratory: CTA B Gastrointestinal: abdomen soft, NT, ND, BS+ Musculoskeletal: no deformities, strength intact in all 4 Skin: moist, warm, no rashes  ASSESSMENT: 1. 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 Patient with long-standing, uncontrolled diabetes, on premixed insulin regimen, with frequent medication noncompliance due to forgetting to take the insulin doses and due to insurance problems. We are very limited in the choices of her diabetic medications by her ability to afford the medicines and also her intolerances. They tell me now that they can afford the basal and bolus regimen >> will switch - I advised her to:   Patient Instructions  Please stop the insulin 70/30 and start: - Levemir 60 units at bedtime - NovoLog  20 units with a smaller meal 25 units with a larger meal Please come back in 1 month with your sugar log.  - advised her to continue checking her sugars at different times of the day - check 3 times a day, rotating checks - advised for yearly eye exams - she is up to date (Dr. Zigmund Daniel) >> cataracts, no DR - Return to clinic in one month with sugar log

## 2014-02-05 NOTE — Progress Notes (Signed)
Patient ID: Amanda [name], female   DOB: 1941-05-23, 73 y.o.   MRN: [id number]  HPI: Amanda [name] is a 73 y.o.-year-old female, returning for f/u for DM2, dx 2008, insulin-dependent since dx, uncontrolled, with complications (Amanda, PN). Last visit 6 mo ago.   She had an abscess on her buttock. She also had severe diarrhea >> C diff. Also had a UTI. She was discharged was on 02/02/2014.  Last hemoglobin A1c was: Lab Results  Component Value Date   HGBA1C 13.5* 01/31/2014   HGBA1C 12.2* 05/31/2013   HGBA1C 12.8 11/16/2010  - in 10/16/2012, hemoglobin A1c was 6.9% - in 07/2012, hemoglobin A1c was 9.9% - in 11/16/2010, hemoglobin A1c was 12.8% - in 03/2011, hemoglobin A1c was 6.9% - In 07/10/2010 her hemoglobin A1c was 10.5% C-peptide was 6.3 and 01/2011.  Pt is on a regimen of: - NPH-regular (novolin) 70/30 - 75-28-58 units with meals - syringes >> not compliant with insulin >> was off before her last hospitalization  She could not tolerate doses of metformin XR higher than 250 mg twice a day in the past due to diarrhea. She does not want to resume this. Was previously on the Amanda [name] 75 mg daily - "made me sick". She was previously on Amanda [name] between 2012-2013. She was on Amanda [name] - but could not afford it while in the Medicare doughnut hole. Per Amanda. Darryl [name] notes, patient was missing approximately 4 doses of insulin a week when eating out. Also, per his review of pharmacy records in 07/2012 this showed incomplete adherence to some or all of her medicines, although she claimed full burins other than missing lunchtime insulin doses half of the time.  Pt checks her sugars 1-3x a day and they are higher than before: - am: 190-304 >> 210-400 (lowest 140) >> 143-191 - before lunch: 220-290 >> 200-300s >> 140, 223, 247 - before dinner: 140-305 >> 200-300s >> 206, 260 No lows. Lowest sugar was 140 in last mo; she has hypoglycemia awareness at 100. Highest sugar was 400s  Pt's meals are -  reviewed per last vivit: - Breakfast: "I am not a breakfast eater": 1 egg + toast; 3 pancakes + sugarfree syrup; waffle; french toast - not skipping anymore  - Lunch: sometimes skips, sometimes eats out: pizza, salad, no dessert - Dinner: at 5:30-6pm: soup, meat + vegetables + starch, dessert - Snacks: 8 pm: fruit, sugar free  She is eating out 4 times a week. She was seen for nutritional advice and diabetes education, with last visit on 04/2012 - Amanda [name].  - no CKD, last BUN/creatinine:  Lab Results  Component Value Date   BUN 5* 02/01/2014   CREATININE 0.55 02/01/2014  She is on ACEI. Last microalbumin to creatinine ratio was 19.2 in 09/2012. - last set of lipids: Lab Results  Component Value Date   CHOL 167 06/01/2013   HDL 21* 06/01/2013   LDLCALC 91 06/01/2013   TRIG 275* 06/01/2013   CHOLHDL 8.0 06/01/2013  She is on Pravastatin. - last eye exam was in 07/2013. Has nonproliferative Amanda her eye exam in 02/2012. At last eye exam (Amanda [name]): cataracts. - no numbness and tingling in her feet. She is on aspirin 81 mg daily.  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  ROS: Constitutional: + weight gain, poor sleep Eyes: no blurry vision, no xerophthalmia ENT: no  sore throat, no nodules palpated in throat, no dysphagia/odynophagia, no hoarseness; Tinnitus Cardiovascular: no CP/SOB/palpitations/+ leg and hand swelling Respiratory:no  cough/no SOB Gastrointestinal: no N/V/+ D/no C Musculoskeletal: no muscle/joint aches Skin: + rash Neurological: no tremors/numbness/tingling/dizziness  I reviewed pt's medications, allergies, PMH, social hx, family hx and no changes required, except as mentioned above.  PE: BP 128/72  Pulse 97  Temp(Src) 98.7 F (37.1 C) (Oral)  Resp 12  Wt 199 lb (90.266 kg)   SpO2 94% Wt Readings from Last 3 Encounters:  02/05/14 199 lb (90.266 kg)  01/29/14 191 lb 12.8 oz (87 kg)  01/29/14 192 lb (87.091 kg)   Constitutional: overweight, in NAD Eyes: PERRLA, EOMI, no exophthalmos ENT: moist mucous membranes, no thyromegaly, no cervical lymphadenopathy Cardiovascular: RRR, No MRG Respiratory: CTA B Gastrointestinal: abdomen soft, NT, ND, BS+ Musculoskeletal: no deformities, strength intact in all 4 Skin: moist, warm, no rashes  ASSESSMENT: 1. DM2, insulin-dependent, uncontrolled, with complications - nonprolif. Amanda - peripheral neuropathy  PLAN:  1. Patient with long-standing, uncontrolled diabetes, on premixed insulin regimen, with frequent medication noncompliance due to forgetting to take the insulin doses and due to insurance problems. We are very limited in the choices of her diabetic medications by her ability to afford the medicines and also her intolerances. They tell me now that they can afford the basal and bolus regimen >> will switch - I advised her to:   Patient Instructions  Please stop the insulin 70/30 and start: - Levemir 60 units at bedtime - NovoLog  20 units with a smaller meal 25 units with a larger meal Please come back in 1 month with your sugar log.  - advised her to continue checking her sugars at different times of the day - check 3 times a day, rotating checks - advised for yearly eye exams - she is up to date (Amanda. Zigmund [name]) >> cataracts, no Amanda - she has PVera >> next time will need A1c and fructosamine - Return to clinic in one month with sugar log

## 2014-02-05 NOTE — Telephone Encounter (Signed)
Spoke with pt and husband   Pt states she is feeling better  I gave her husband BOB the phone  Number to [facility] they have an appt on 4/24 at 9 am for her C diff colitis  I advised them to go .  I also advised pt and husband if diarrhea or fever or abd pain develops to go to ER of choice  They did see Dr. Cruzita Lederer today   I also advised them to follow with hematology  Dr. Alvy Bimler and gave [name] her husband phone number to call

## 2014-02-05 NOTE — Telephone Encounter (Signed)
Amanda [name]'s husband called about samples and a prescription

## 2014-02-07 ENCOUNTER — Telehealth: Payer: Self-pay | Admitting: *Deleted

## 2014-02-07 ENCOUNTER — Other Ambulatory Visit: Payer: Self-pay | Admitting: *Deleted

## 2014-02-07 ENCOUNTER — Ambulatory Visit (INDEPENDENT_AMBULATORY_CARE_PROVIDER_SITE_OTHER): Payer: Medicare HMO | Admitting: Internal Medicine

## 2014-02-07 ENCOUNTER — Encounter: Payer: Self-pay | Admitting: Internal Medicine

## 2014-02-07 VITALS — BP 170/82 | HR 90 | Temp 98.1°F | Resp 22 | Wt 199.0 lb

## 2014-02-07 DIAGNOSIS — D45 Polycythemia vera: Secondary | ICD-10-CM

## 2014-02-07 DIAGNOSIS — K7460 Unspecified cirrhosis of liver: Secondary | ICD-10-CM

## 2014-02-07 DIAGNOSIS — A0472 Enterocolitis due to Clostridium difficile, not specified as recurrent: Secondary | ICD-10-CM

## 2014-02-07 DIAGNOSIS — K8020 Calculus of gallbladder without cholecystitis without obstruction: Secondary | ICD-10-CM

## 2014-02-07 DIAGNOSIS — N390 Urinary tract infection, site not specified: Secondary | ICD-10-CM

## 2014-02-07 MED ORDER — HYDROXYUREA 500 MG PO CAPS: 500.0000 mg | ORAL_CAPSULE | Freq: Two times a day (BID) | ORAL | Status: DC

## 2014-02-07 NOTE — Telephone Encounter (Signed)
Pt states she saw Dr Rudene Anda today and they want her to see Dr Alvy Bimler ASAP as she has an enlarged spleen. Pt is "out" of Hydrea.

## 2014-02-07 NOTE — Progress Notes (Signed)
Subjective:    Patient ID: Amanda [name], female    DOB: 1940/10/28, 73 y.o.   MRN: [id number]  HPI  Amanda [name] is here for hospital follow up.    She was found to have E coli UTI  ( pt finished course of Levaquin) and also found to have  C diff. colitis.  She  is taking her TID Flagyl.  (She had po vanc while hospitalized)   Diarrhea improved but still present with 1-2 loose stools per day    She was also found to have incidental gallstones and cirrhotic liver seen on Ct imaging.  She is asymptomatic - no biliary symptom or RUQ pain    She reports no fever ,   Feels much better  Recently switched to Levemir by her enodcrinologist and glucoses under better control  She does report that she noted scaly erythematous  lesions Right lower leg - these appear  To be healing according to pt.  Began several days ago. ( She has had multiple antibiotics during hospitalization)  There has been no drainage and no purulence from skin lesions.  Allergies  Allergen Reactions  . Tape Hives  . Doxycycline Hives, Swelling and Rash  . Latex Hives, Itching and Rash   Past Medical History  Diagnosis Date  . Hypertension   . Hyperlipidemia   . Diabetes mellitus 2008  . Depression   . Obesity   . Psoriasis   . Polycythemia     Dr. Elease Hashimoto- [facility]  . Herpes simplex   . Gout   . Hyperglycemia 05/31/2013  . C. difficile colitis   . Cirrhosis    Past Surgical History  Procedure Laterality Date  . Breast surgery Left     milk duct  . Total abdominal hysterectomy w/ bilateral salpingoophorectomy      for heavy periods with appendectomy  . Tonsillectomy     History   Social History  . Marital Status: Married    Spouse Name: N/A    Number of Children: 2  . Years of Education: N/A   Occupational History  . housewife    Social History Main Topics  . Smoking status: Former Smoker -- 48 years    Types: Cigarettes    Quit date: 10/18/1986  . Smokeless tobacco: Never Used  . Alcohol Use:  No  . Drug Use: No  . Sexual Activity: Yes    Birth Control/ Protection: None   Other Topics Concern  . Not on file   Social History Narrative  . No narrative on file   Family History  Problem Relation Age of Onset  . Diabetes Mother   . Heart disease Mother     CAD  . Hyperlipidemia Mother   . Hypertension Mother   . Kidney disease Mother   . Kidney disease Father   . Breast cancer Maternal Aunt   . Heart disease Maternal Grandmother   . Heart disease Other     maternal aunts and uncles   Patient Active Problem List   Diagnosis Date Noted  . Cholelithiasis 02/07/2014  . Clostridium difficile colitis 02/05/2014  . UTI (lower urinary tract infection) 01/29/2014  . Dizziness 01/29/2014  . Diarrhea 01/29/2014  . Dehydration 01/29/2014  . Candidiasis of genitalia in female 11/19/2013  . Cellulitis and abscess of buttock, left 11/01/2013  . Edema 07/09/2013  . Constipation 07/09/2013  . Type II or unspecified type diabetes mellitus without mention of complication, uncontrolled 06/07/2013  . Leukocytosis, unspecified 06/02/2013  .  Bacteria in urine 06/01/2013  . Hyperglycemia 05/31/2013  . Gout 05/31/2013  . 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 HTN (hypertension) 05/31/2013  . History of Bell's palsy 05/26/2013  . DERMATITIS, ATOPIC 11/16/2010  . DIZZINESS 11/16/2010  . WEIGHT GAIN 11/16/2010  . HERPETIC WHITLOW 06/30/2010  . HYPERGLYCEMIA 05/07/2010  . DIASTOLIC DYSFUNCTION 79/11/4095  . DYSPNEA 02/17/2010  . ABSCESS, SKIN 12/22/2009  . SKIN RASH 12/22/2009  . HYPERLIPIDEMIA 12/15/2009  . POLYCYTHEMIA 11/24/2009  . Essential hypertension, benign 11/24/2009  . PSORIASIS 11/24/2009  . Depressive disorder, not elsewhere classified 09/13/2009  . ABSCESS, LEG 09/13/2009  . SPRAIN/STRAIN, ANKLE NOS 01/21/2009   Current Outpatient Prescriptions on File Prior to Visit  Medication Sig Dispense Refill  . aspirin EC 81 MG tablet Take 81 mg by mouth daily.      .  cetaphil (CETAPHIL) lotion Apply topically daily.  236 mL  0  . HYDROcodone-acetaminophen (NORCO/VICODIN) 5-325 MG per tablet Take 2 tablets by mouth every 4 (four) hours as needed for moderate pain.      Marland Kitchen ibuprofen (ADVIL,MOTRIN) 200 MG tablet Take 200 mg by mouth every 6 (six) hours as needed for moderate pain.      Marland Kitchen insulin aspart (NOVOLOG FLEXPEN) 100 UNIT/ML FlexPen Inject up to 75 units under skin  10 pen  11  . Insulin Detemir (LEVEMIR) 100 UNIT/ML Pen Inject 60 units under skin at bedtime  10 pen  11  . lisinopril (PRINIVIL,ZESTRIL) 40 MG tablet Take 1 tablet (40 mg total) by mouth daily.  90 tablet  3  . metroNIDAZOLE (FLAGYL) 500 MG tablet Take 1 tablet (500 mg total) by mouth 3 (three) times daily.  30 tablet  0  . nystatin cream (MYCOSTATIN) Apply 1 application topically 2 (two) times daily.      . hydrocortisone 2.5 % cream Apply 1 application topically 2 (two) times daily.      Marland Kitchen saccharomyces boulardii (FLORASTOR) 250 MG capsule Take 1 capsule (250 mg total) by mouth 2 (two) times daily.  60 capsule  1   No current facility-administered medications on file prior to visit.      Review of Systems See HPI    Objective:   Physical Exam Physical Exam  Nursing note and vitals reviewed.  Constitutional: She is oriented to person, place, and time. She appears well-developed and well-nourished.  HENT:  Head: Normocephalic and atraumatic.  Cardiovascular: Normal rate and regular rhythm. Exam reveals no gallop and no friction rub.  No murmur heard.  Pulmonary/Chest: Breath sounds normal. She has no wheezes. She has no rales.  Abd :   Soft NT/ND  No HSM>  Neurological: She is alert and oriented to person, place, and time.  Skin: Skin is warm and dry.   She has 3  slightly reddened  Nodules vs hematoma on R shin.  Some scaling no purulence or drainage Psychiatric: She has a normal mood and affect. Her behavior is normal.         Assessment & Plan:  C Diff colitis  Had  vancomycin while hospitalized ,  On Flagy now.  Keep appt with GI in am  E coli UTI  Finished course of Levaquin  willl check U/A today  DM  Pt is very non-adherent to treatment   She has recently been changed to Levemir  Incidental cholelithiasis educated regarding signs of RUQ pain with fever or N/V to notify my office  Cirrhosis seen on CT   Has appt with GI in am  R LE skin lesion  Appear to be healing  Etiology unclear at this poin  PV followed by Dr. Alvy Bimler  She is on hydroxyurea.    HTN  Continue meds  See me 3 months or prn

## 2014-02-08 ENCOUNTER — Ambulatory Visit (INDEPENDENT_AMBULATORY_CARE_PROVIDER_SITE_OTHER): Payer: Medicare HMO | Admitting: Physician Assistant

## 2014-02-08 ENCOUNTER — Encounter: Payer: Self-pay | Admitting: Physician Assistant

## 2014-02-08 ENCOUNTER — Telehealth: Payer: Self-pay | Admitting: *Deleted

## 2014-02-08 VITALS — BP 152/72 | HR 80 | Ht 63.0 in | Wt 197.2 lb

## 2014-02-08 DIAGNOSIS — R197 Diarrhea, unspecified: Secondary | ICD-10-CM

## 2014-02-08 DIAGNOSIS — K8020 Calculus of gallbladder without cholecystitis without obstruction: Secondary | ICD-10-CM

## 2014-02-08 DIAGNOSIS — K7460 Unspecified cirrhosis of liver: Secondary | ICD-10-CM

## 2014-02-08 MED ORDER — VANCOMYCIN HCL 250 MG PO CAPS
250.0000 mg | ORAL_CAPSULE | Freq: Four times a day (QID) | ORAL | Status: AC
Start: 2014-02-08 — End: 2014-02-22

## 2014-02-08 MED ORDER — METRONIDAZOLE 250 MG PO TABS
ORAL_TABLET | ORAL | Status: DC
Start: 2014-02-08 — End: 2014-02-28

## 2014-02-08 NOTE — Telephone Encounter (Signed)
We called the patient and advised her that we sent 7 additional days of Flagyl 250 mg twice daily to [facility].  I asked her to please give [place] a progress report once she finishes the Flagyl. I asked her to please call me.

## 2014-02-08 NOTE — Telephone Encounter (Signed)
Holland Falling did approve the Prior authorization. Confirmation number is [id number].

## 2014-02-08 NOTE — Telephone Encounter (Signed)
Called pt to advise the [employer] approved the [place] prescription and she can get it Wed , 4-29 at [facility], [place], [place].

## 2014-02-08 NOTE — Patient Instructions (Signed)
We made you a follow up appointment with Dr. [name] to be evaluated for cirrhosis. Date is 03-29-2014 at 2:15 PM.  Amanda [name] prescribed the Vancomycin. I have to do a prior authorization with the insurance company and will call you once we hear from the insurance company.    Continue the flagyl antibiotic until finished. Take the Florastor tablets , 1 tab twice daily.

## 2014-02-08 NOTE — Progress Notes (Signed)
Subjective:    Patient ID: Amanda [name], female    DOB: 08/23/1941, 73 y.o.   MRN: [id number]  HPI   [name] is a pleasant 73 year old white female new to GI today. She is referred by Dr. Lyndee Leo   after recent hospitalization. Patient has history of insulin-dependent diabetes mellitus, polycythemia, hypertension, hyperlipidemia. She was hospitalized  4/14-/4/18 with severe diarrhea. She was diagnosed with a urinary tract infection and C. difficile colitis. She was treated with a course of Cipro for the UTI and is currently completing a course of Flagyl 503 times daily for C. difficile.  Patient says that she became ill about 3 months ago initially with an abscess on her buttocks which had to be lanced. She took a couple of courses of antibiotics for that and then about 2 weeks ago developed acute severe diarrhea with up to 20 bowel movements per day. She had some incontinence with this and decrease in appetite vague nausea but no fever chills vomiting or, pain.  During her hospitalization she did have imaging done with CT of the abdomen and pelvis on 01/28/2014 this showed a nodular liver consistent with cirrhosis and nonspecific hypodensities throughout the spleen , as well as gallstones. She says she is feeling better at this point but still having diarrhea. She says that she has urgent diarrhea early in the morning says in the frayed to leave her house in the morning. She does says she has 3-4 episodes of liquid stools and and seems to be okay for the rest of the day. She is gaining some strength back and eating a bit better- though still feels weak.  Patient did not know that she had gallstones or cirrhosis prior to that CT scan. She says she has only had 3 drinks in her life and cannot understand why she might have cirrhosis.. Review of labs from recent hospitalization shows her liver function test to be normal, albumin 3.4, and platelet count of 509. There is no family history of liver  disease that she is aware of, also no family history of colon cancer or polyps.  She does mention that she had a colonoscopy about 2 years ago per Dr. Oletta Lamas. She was found to have one small polyp and told to followup in 5 years. She did not wish to continue her relationship with Dr. Oletta Lamas.     Review of Systems  Constitutional: Positive for appetite change and fatigue.  HENT: Negative.   Eyes: Negative.   Respiratory: Negative.   Cardiovascular: Negative.   Gastrointestinal: Positive for nausea and diarrhea.  Endocrine: Negative.   Genitourinary: Negative.   Musculoskeletal: Negative.   Skin: Negative.   Allergic/Immunologic: Negative.   Neurological: Positive for weakness.  Hematological: Negative.   Psychiatric/Behavioral: Negative.    Outpatient Prescriptions Prior to Visit  Medication Sig Dispense Refill  . aspirin EC 81 MG tablet Take 81 mg by mouth daily.      . cetaphil (CETAPHIL) lotion Apply topically daily.  236 mL  0  . dicyclomine (BENTYL) 10 MG capsule Take 10 mg by mouth 4 (four) times daily -  before meals and at bedtime.      . diphenhydrAMINE (BENADRYL) 25 mg capsule Take 25 mg by mouth every 6 (six) hours as needed.      Marland Kitchen HYDROcodone-acetaminophen (NORCO/VICODIN) 5-325 MG per tablet Take 2 tablets by mouth every 4 (four) hours as needed for moderate pain.      . hydrocortisone 2.5 % cream Apply 1 application  topically 2 (two) times daily.      . hydroxyurea (HYDREA) 500 MG capsule Take 1 capsule (500 mg total) by mouth 2 (two) times daily.  60 capsule  6  . ibuprofen (ADVIL,MOTRIN) 200 MG tablet Take 200 mg by mouth every 6 (six) hours as needed for moderate pain.      Marland Kitchen insulin aspart (NOVOLOG FLEXPEN) 100 UNIT/ML FlexPen Inject up to 75 units under skin  10 pen  11  . Insulin Detemir (LEVEMIR) 100 UNIT/ML Pen Inject 60 units under skin at bedtime  10 pen  11  . lisinopril (PRINIVIL,ZESTRIL) 40 MG tablet Take 1 tablet (40 mg total) by mouth daily.  90 tablet   3  . metoprolol (LOPRESSOR) 100 MG tablet Take 100 mg by mouth 2 (two) times daily.      . metroNIDAZOLE (FLAGYL) 500 MG tablet Take 1 tablet (500 mg total) by mouth 3 (three) times daily.  30 tablet  0  . nystatin cream (MYCOSTATIN) Apply 1 application topically 2 (two) times daily.      . pravastatin (PRAVACHOL) 40 MG tablet Take 40 mg by mouth daily.      Marland Kitchen saccharomyces boulardii (FLORASTOR) 250 MG capsule Take 1 capsule (250 mg total) by mouth 2 (two) times daily.  60 capsule  1  . promethazine (PHENERGAN) 12.5 MG tablet Take 12.5 mg by mouth every 6 (six) hours as needed for nausea or vomiting.       No facility-administered medications prior to visit.   Allergies  Allergen Reactions  . Tape Hives  . Doxycycline Hives, Swelling and Rash  . Latex Hives, Itching and Rash   Patient Active Problem List   Diagnosis Date Noted  . Cholelithiasis 02/07/2014  . Clostridium difficile colitis 02/05/2014  . UTI (lower urinary tract infection) 01/29/2014  . Dizziness 01/29/2014  . Diarrhea 01/29/2014  . Dehydration 01/29/2014  . Candidiasis of genitalia in female 11/19/2013  . Cellulitis and abscess of buttock, left 11/01/2013  . Edema 07/09/2013  . Constipation 07/09/2013  . Type II or unspecified type diabetes mellitus without mention of complication, uncontrolled 06/07/2013  . Leukocytosis, unspecified 06/02/2013  . Bacteria in urine 06/01/2013  . Hyperglycemia 05/31/2013  . Gout 05/31/2013  . 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 HTN (hypertension) 05/31/2013  . History of Bell's palsy 05/26/2013  . DERMATITIS, ATOPIC 11/16/2010  . DIZZINESS 11/16/2010  . WEIGHT GAIN 11/16/2010  . HERPETIC WHITLOW 06/30/2010  . HYPERGLYCEMIA 05/07/2010  . DIASTOLIC DYSFUNCTION 27/03/2375  . DYSPNEA 02/17/2010  . ABSCESS, SKIN 12/22/2009  . SKIN RASH 12/22/2009  . HYPERLIPIDEMIA 12/15/2009  . POLYCYTHEMIA 11/24/2009  . Essential hypertension, benign 11/24/2009  . PSORIASIS 11/24/2009  .  Depressive disorder, not elsewhere classified 09/13/2009  . ABSCESS, LEG 09/13/2009  . SPRAIN/STRAIN, ANKLE NOS 01/21/2009   History  Substance Use Topics  . Smoking status: Former Smoker -- 48 years    Types: Cigarettes    Quit date: 10/18/1986  . Smokeless tobacco: Never Used  . Alcohol Use: No   family history includes Breast cancer in her maternal aunt; Diabetes in her mother; Heart disease in her maternal grandmother, mother, and other; Hyperlipidemia in her mother; Hypertension in her mother; Kidney disease in her father and mother.     Objective:   Physical Exam  well-developed older white female in no acute distress accompanied by her husband- blood pressure 152/72 pulse 80 height 5 foot 3 weight 197. HEENT; nontraumatic normocephalic EOMI PERRLA sclera anicteric, Supple; no JVD, Cardio  vascular; regular rate and rhythm with S1-S2 no murmur or gallop, Pulmonary ;clear bilaterally, Abdomen; large soft nontender nondistended bowel sounds are present, no palpable mass or hepatosplenomegaly, Rectal ;exam not done, Extremities ;no clubbing cyanosis or edema skin warm and dry, Psych ;mood and affect appropriate        Assessment & Plan:   #1  27 your old female with recent hospitalization with C. difficile colitis. She seems to be responding to Flagyl though her diarrhea has not yet resolved. #2 cholelithiasis asymptomatic #3 new diagnosis of cirrhosis by CT scan appears to be compensated. Etiology not clear suspect nonalcoholic fatty liver disease  #4 insulin-dependent diabetes mellitus #5 hyperlipidemia #6 history of polycythemia #7 hypertension #8 history of colon polyp presumed adenomatous. Will obtain records   Plan ; continue Florastor twice daily for 2 more weeks  Had discussed switching to vancomycin with the patient is to complete a two-week course as she has been on Flagyl for 10 days and still has some diarrhea. However after checking with her insurance and a couple of  pharmacies . this will be prohibitively  expensive for her. We will therefore extend out the course of metronidazole for an extra week. She is asked to call in the interim should her diarrhea worsen at any point or if it does not resolve by the time she completes the third week of metronidazole. Initiated discussion regarding cirrhosis. She will need additional lab work but would like to get over her C. difficile colitis prior to pursuing this. Will make her a followup appointment with Dr. [name] in about a month to discuss further evaluation of cirrhosis.  Patient signed a release and we'll obtain her records from Dr. Oletta Lamas regarding colonoscopy and then determine interval for followup exam

## 2014-02-10 NOTE — Patient Instructions (Signed)
To Keep appt with GI MD in am.

## 2014-02-11 NOTE — Progress Notes (Signed)
i agre with the plan above

## 2014-02-14 ENCOUNTER — Telehealth: Payer: Self-pay | Admitting: Hematology and Oncology

## 2014-02-14 ENCOUNTER — Telehealth: Payer: Self-pay | Admitting: *Deleted

## 2014-02-14 NOTE — Telephone Encounter (Signed)
moved to May per MD

## 2014-02-14 NOTE — Telephone Encounter (Signed)
Pt called to inquire about appt.  She had called last week to ask to be seen sooner than her June appt.  Informed pt of next available on 5/14 at 9:30 am.  She verbalized understanding.

## 2014-02-28 ENCOUNTER — Other Ambulatory Visit (HOSPITAL_BASED_OUTPATIENT_CLINIC_OR_DEPARTMENT_OTHER): Payer: Medicare HMO

## 2014-02-28 ENCOUNTER — Encounter: Payer: Self-pay | Admitting: Hematology and Oncology

## 2014-02-28 ENCOUNTER — Ambulatory Visit (HOSPITAL_BASED_OUTPATIENT_CLINIC_OR_DEPARTMENT_OTHER): Payer: Medicare HMO | Admitting: Hematology and Oncology

## 2014-02-28 VITALS — BP 166/55 | HR 73 | Temp 97.5°F | Resp 18 | Ht 63.0 in | Wt 195.5 lb

## 2014-02-28 DIAGNOSIS — D72829 Elevated white blood cell count, unspecified: Secondary | ICD-10-CM

## 2014-02-28 DIAGNOSIS — K7460 Unspecified cirrhosis of liver: Secondary | ICD-10-CM

## 2014-02-28 DIAGNOSIS — D47Z9 Other specified neoplasms of uncertain behavior of lymphoid, hematopoietic and related tissue: Secondary | ICD-10-CM

## 2014-02-28 DIAGNOSIS — D473 Essential (hemorrhagic) thrombocythemia: Secondary | ICD-10-CM

## 2014-02-28 DIAGNOSIS — R161 Splenomegaly, not elsewhere classified: Secondary | ICD-10-CM

## 2014-02-28 DIAGNOSIS — D45 Polycythemia vera: Secondary | ICD-10-CM

## 2014-02-28 DIAGNOSIS — D471 Chronic myeloproliferative disease: Secondary | ICD-10-CM

## 2014-02-28 LAB — CBC WITH DIFFERENTIAL/PLATELET
BASO%: 0.7 % (ref 0.0–2.0)
Basophils Absolute: 0.1 10*3/uL (ref 0.0–0.1)
EOS ABS: 0.4 10*3/uL (ref 0.0–0.5)
EOS%: 3.3 % (ref 0.0–7.0)
HCT: 44.8 % (ref 34.8–46.6)
HGB: 15.3 g/dL (ref 11.6–15.9)
LYMPH%: 17.3 % (ref 14.0–49.7)
MCH: 32.1 pg (ref 25.1–34.0)
MCHC: 34.2 g/dL (ref 31.5–36.0)
MCV: 94.1 fL (ref 79.5–101.0)
MONO#: 0.8 10*3/uL (ref 0.1–0.9)
MONO%: 6.5 % (ref 0.0–14.0)
NEUT#: 8.7 10*3/uL — ABNORMAL HIGH (ref 1.5–6.5)
NEUT%: 72.2 % (ref 38.4–76.8)
NRBC: 0 % (ref 0–0)
Platelets: 533 10*3/uL — ABNORMAL HIGH (ref 145–400)
RBC: 4.76 10*6/uL (ref 3.70–5.45)
RDW: 16.2 % — AB (ref 11.2–14.5)
WBC: 12.1 10*3/uL — ABNORMAL HIGH (ref 3.9–10.3)
lymph#: 2.1 10*3/uL (ref 0.9–3.3)

## 2014-02-28 LAB — TECHNOLOGIST REVIEW

## 2014-02-28 NOTE — Progress Notes (Signed)
[facility] OFFICE PROGRESS NOTE  Patient Care Team: Lanice Shirts, MD as PCP - General (Internal Medicine) Limmie Patricia, MD as Attending Physician (Endocrinology) [name], MD as Consulting Physician (Hematology and Oncology) [name], MD as Consulting Physician (Internal Medicine)  DIAGNOSIS: Myeloproliferative disorder, on hydroxyurea  SUMMARY OF ONCOLOGIC HISTORY: [name] who was found to have erythrocytosis requiring phlebotomy and tested negative for that JAK 2 to mutation. The patient had bone marrow aspirate and biopsy performed in [place] and confirmed myeloproliferative disorder. We do not have records of those results. She had been treated with phlebotomy, hydroxyurea and aspirin.  The patient also have liver cirrhosis with splenomegaly, on observation INTERVAL HISTORY: Amanda [name] 73 y.o. female returns for further followup. A month ago, she was admitted to the hospital with urinary tract infection and subsequently developed C. difficile. She is recovering from her illness. Imaging study with CT scan of the abdomen and pelvis revealed liver cirrhosis and splenomegaly.  I have reviewed the past medical history, past surgical history, social history and family history with the patient and they are unchanged from previous note.  ALLERGIES:  is allergic to tape; doxycycline; and latex.  MEDICATIONS:  Current Outpatient Prescriptions  Medication Sig Dispense Refill  . aspirin EC 81 MG tablet Take 81 mg by mouth daily.      . cetaphil (CETAPHIL) lotion Apply topically daily.  236 mL  0  . dicyclomine (BENTYL) 10 MG capsule Take 10 mg by mouth 4 (four) times daily -  before meals and at bedtime.      . diphenhydrAMINE (BENADRYL) 25 mg capsule Take 25 mg by mouth every 6 (six) hours as needed.      Marland Kitchen HYDROcodone-acetaminophen (NORCO/VICODIN) 5-325 MG per tablet Take 2 tablets by mouth every 4 (four) hours as needed for  moderate pain.      . hydrocortisone 2.5 % cream Apply 1 application topically 2 (two) times daily.      . hydroxyurea (HYDREA) 500 MG capsule Take 1 capsule (500 mg total) by mouth 2 (two) times daily.  60 capsule  6  . ibuprofen (ADVIL,MOTRIN) 200 MG tablet Take 200 mg by mouth every 6 (six) hours as needed for moderate pain.      Marland Kitchen insulin aspart (NOVOLOG FLEXPEN) 100 UNIT/ML FlexPen Inject up to 75 units under skin  10 pen  11  . Insulin Detemir (LEVEMIR) 100 UNIT/ML Pen Inject 60 units under skin at bedtime  10 pen  11  . lisinopril (PRINIVIL,ZESTRIL) 40 MG tablet Take 1 tablet (40 mg total) by mouth daily.  90 tablet  3  . metoprolol (LOPRESSOR) 100 MG tablet Take 100 mg by mouth 2 (two) times daily.      Marland Kitchen nystatin cream (MYCOSTATIN) Apply 1 application topically 2 (two) times daily.      . ondansetron (ZOFRAN) 8 MG tablet Take 8 mg by mouth every 8 (eight) hours as needed for nausea or vomiting.      . pravastatin (PRAVACHOL) 40 MG tablet Take 40 mg by mouth daily.      . Probiotic Product (PROBIOTIC DAILY PO) Take by mouth daily.      Marland Kitchen saccharomyces boulardii (FLORASTOR) 250 MG capsule Take 1 capsule (250 mg total) by mouth 2 (two) times daily.  60 capsule  1   No current facility-administered medications for this visit.    REVIEW OF SYSTEMS:   Constitutional: Denies fevers, chills or abnormal weight  loss Eyes: Denies blurriness of vision Ears, nose, mouth, throat, and face: Denies mucositis or sore throat Respiratory: Denies cough, dyspnea or wheezes Cardiovascular: Denies palpitation, chest discomfort or lower extremity swelling Skin: Denies abnormal skin rashes Lymphatics: Denies new lymphadenopathy or easy bruising Neurological:Denies numbness, tingling or new weaknesses Behavioral/Psych: Mood is stable, no new changes  All other systems were reviewed with the patient and are negative.  PHYSICAL EXAMINATION: ECOG PERFORMANCE STATUS: 1 - Symptomatic but completely  ambulatory  Filed Vitals:   02/28/14 0934  BP: 166/55  Pulse: 73  Temp: 97.5 F (36.4 C)  Resp: 18   Filed Weights   02/28/14 0934  Weight: 195 lb 8 oz (88.678 kg)    GENERAL:alert, no distress and comfortable. She is obese SKIN: skin color, texture, turgor are normal, no rashes or significant lesions EYES: normal, Conjunctiva are pink and non-injected, sclera clear OROPHARYNX:no exudate, no erythema and lips, buccal mucosa, and tongue normal  NECK: supple, thyroid normal size, non-tender, without nodularity LYMPH:  no palpable lymphadenopathy in the cervical, axillary or inguinal LUNGS: Mild crackles on both lung bases. No wheezes.  HEART: regular rate & rhythm and no murmurs and no lower extremity edema ABDOMEN:abdomen soft, non-tender and normal bowel sounds Musculoskeletal:no cyanosis of digits and no clubbing  NEURO: alert & oriented x 3 with fluent speech, no focal motor/sensory deficits  LABORATORY DATA:  I have reviewed the data as listed    Component Value Date/Time   NA 139 02/01/2014 0911   NA 133* 12/10/2013 1239   K 4.0 02/01/2014 0911   K 4.0 12/10/2013 1239   CL 102 02/01/2014 0911   CL 103 02/16/2013 0900   CO2 22 02/01/2014 0911   CO2 21* 12/10/2013 1239   GLUCOSE 209* 02/01/2014 0911   GLUCOSE 635* 12/10/2013 1239   GLUCOSE 396* 02/16/2013 0900   GLUCOSE 292 06/02/2010   BUN 5* 02/01/2014 0911   BUN 7.9 12/10/2013 1239   CREATININE 0.55 02/01/2014 0911   CREATININE 1.2* 12/10/2013 1239   CALCIUM 9.3 02/01/2014 0911   CALCIUM 9.5 12/10/2013 1239   PROT 6.3 02/01/2014 0911   PROT 6.7 12/10/2013 1239   ALBUMIN 3.2* 02/01/2014 0911   ALBUMIN 3.7 12/10/2013 1239   AST 28 02/01/2014 0911   AST 27 12/10/2013 1239   ALT 10 02/01/2014 0911   ALT 18 12/10/2013 1239   ALKPHOS 98 02/01/2014 0911   ALKPHOS 108 12/10/2013 1239   BILITOT 0.6 02/01/2014 0911   BILITOT 0.83 12/10/2013 1239   GFRNONAA >90 02/01/2014 0911   GFRAA >90 02/01/2014 0911    No results found for this  basename: SPEP,  UPEP,   kappa and lambda light chains    Lab Results  Component Value Date   WBC 12.1* 02/28/2014   NEUTROABS 8.7* 02/28/2014   HGB 15.3 02/28/2014   HCT 44.8 02/28/2014   MCV 94.1 02/28/2014   PLT 533* 02/28/2014      Chemistry      Component Value Date/Time   NA 139 02/01/2014 0911   NA 133* 12/10/2013 1239   K 4.0 02/01/2014 0911   K 4.0 12/10/2013 1239   CL 102 02/01/2014 0911   CL 103 02/16/2013 0900   CO2 22 02/01/2014 0911   CO2 21* 12/10/2013 1239   BUN 5* 02/01/2014 0911   BUN 7.9 12/10/2013 1239   CREATININE 0.55 02/01/2014 0911   CREATININE 1.2* 12/10/2013 1239      Component Value Date/Time   CALCIUM 9.3 02/01/2014  2957   CALCIUM 9.5 12/10/2013 1239   ALKPHOS 98 02/01/2014 0911   ALKPHOS 108 12/10/2013 1239   AST 28 02/01/2014 0911   AST 27 12/10/2013 1239   ALT 10 02/01/2014 0911   ALT 18 12/10/2013 1239   BILITOT 0.6 02/01/2014 0911   BILITOT 0.83 12/10/2013 1239     RADIOGRAPHIC STUDIES: I reviewed the imaging study myself I have personally reviewed the radiological images as listed and agreed with the findings in the report.  ASSESSMENT & PLAN:  #1 myeloproliferative disorder with mild leukocytosis and thrombocytosis The patient had bone marrow biopsy done elsewhere. She is doing well with hydroxyurea and aspirin. She will continue on to hydroxyurea 2 tablets per day along with aspirin 81 mg daily. #2 liver cirrhosis #3 splenomegaly The cause of splenomegaly is related to liver cirrhosis. I recommend dietary modification and weight loss.  All questions were answered. The patient knows to call the clinic with any problems, questions or concerns. No barriers to learning was detected. I spent 25 minutes counseling the patient face to face. The total time spent in the appointment was 30 minutes and more than 50% was on counseling and review of test results     [name], MD 02/28/2014 9:57 AM

## 2014-03-01 ENCOUNTER — Telehealth: Payer: Self-pay | Admitting: *Deleted

## 2014-03-01 ENCOUNTER — Other Ambulatory Visit: Payer: Self-pay | Admitting: *Deleted

## 2014-03-01 NOTE — Telephone Encounter (Signed)
Refill request

## 2014-03-02 ENCOUNTER — Telehealth: Payer: Self-pay | Admitting: Hematology and Oncology

## 2014-03-02 NOTE — Telephone Encounter (Signed)
lvm for pt regarding to Sept appt...mailed pt appt sched/avs and letter

## 2014-03-02 NOTE — Telephone Encounter (Signed)
lvm for pt regarding to Sept appt....mailed pt appt sched/avs and letter

## 2014-03-05 ENCOUNTER — Telehealth: Payer: Self-pay | Admitting: Internal Medicine

## 2014-03-05 MED ORDER — PRAVASTATIN SODIUM 40 MG PO TABS: 40.0000 mg | ORAL_TABLET | Freq: Every day | ORAL | Status: DC

## 2014-03-05 NOTE — Telephone Encounter (Signed)
Patient is out of Levemir and wants to know how much Humalog 70/30 she can take to use as back up  Please advise patient   Call back: [phone] Thank You :)

## 2014-03-05 NOTE — Telephone Encounter (Signed)
Called pt and advised her per Dr Arman Filter note: It is probably best to switch back to: insulin 70/30: 50 units before breakfast, 20 units before lunch and 50 units before dinner. Dr Cruzita Lederer will see you in 3 days in clinic.

## 2014-03-05 NOTE — Telephone Encounter (Signed)
It is probably best to switch back to: insulin 70/30 50 units before breakfast, 20 units before lunch and 50 units before dinner. I will see her in 3 days in clinic.

## 2014-03-05 NOTE — Telephone Encounter (Signed)
Please read note below. Called and spoke with pt's husband. Pt is out of Levemir and cannot get any until the end of the month. Please advise. Thank you.

## 2014-03-08 ENCOUNTER — Ambulatory Visit (INDEPENDENT_AMBULATORY_CARE_PROVIDER_SITE_OTHER): Payer: Medicare HMO | Admitting: Internal Medicine

## 2014-03-08 ENCOUNTER — Encounter: Payer: Self-pay | Admitting: Internal Medicine

## 2014-03-08 VITALS — BP 162/72 | HR 86 | Temp 97.8°F | Resp 14 | Ht 64.5 in | Wt 194.8 lb

## 2014-03-08 DIAGNOSIS — E1149 Type 2 diabetes mellitus with other diabetic neurological complication: Secondary | ICD-10-CM

## 2014-03-08 MED ORDER — INSULIN LISPRO 100 UNIT/ML ~~LOC~~ SOLN: SUBCUTANEOUS | Status: DC

## 2014-03-08 MED ORDER — GABAPENTIN 100 MG PO CAPS: ORAL_CAPSULE | ORAL | Status: DC

## 2014-03-08 NOTE — Progress Notes (Signed)
Patient ID: Amanda [name], female   DOB: [birth year], 73 y.o.   MRN: [id number]  HPI: Amanda [name] is a 73 y.o.-year-old female, returning for f/u for DM2, dx 2008, insulin-dependent since dx, uncontrolled, with complications (DR, PN). Last visit 1 mo ago.   Last hemoglobin A1c was: Lab Results  Component Value Date   HGBA1C 13.5* 01/31/2014   HGBA1C 12.2* 05/31/2013   HGBA1C 12.8 11/16/2010  - in 10/16/2012, hemoglobin A1c was 6.9% - in 07/2012, hemoglobin A1c was 9.9% - in 11/16/2010, hemoglobin A1c was 12.8% - in 03/2011, hemoglobin A1c was 6.9% - In 07/10/2010 her hemoglobin A1c was 10.5% C-peptide was 6.3 and 01/2011.  Pt is on a regimen of: - NPH-regular (novolin) 70/30 - 50-20-50 units with meals - syringes  - takes this 10-15 min before a meal  We tried the following regimen - but she cannot afford: - Levemir 60 units at bedtime - NovoLog  20 units with a smaller meal 25 units with a larger meal  She could not tolerate doses of metformin XR higher than 250 mg twice a day in the past due to diarrhea. She does not want to resume this. Was previously on the Victoza 1.8 mg daily - "made me sick". She was previously on Janumet between 2012-2013. She was on Invokana - but could not afford it while in the Medicare doughnut hole. Per Dr. Darryl Nestle notes, patient was missing approximately 4 doses of insulin a week when eating out. Also, per his review of pharmacy records in 07/2012 this showed incomplete adherence to some or all of her medicines, although she claimed full burins other than missing lunchtime insulin doses half of the time.  Pt checks her sugars 1-3x a day and they are: - am: 190-304 >> 210-400 (lowest 140) >> 143-191 >> 136-178 - 2h after b'fast: n/c - before lunch: 220-290 >> 200-300s >> 140, 223, 247 >> 161-319 - 2h after lunch: 240-312 - before dinner: 140-305 >> 200-300s >> 206, 260 >> 150-330 (300s if is out all day and does not take the NovoLog, OTW up to  270s) - bedtime: 152-306 No lows. Lowest sugar was 140 in last mo; she has hypoglycemia awareness at 100. Highest sugar was 400s  Pt's meals are - reviewed per last vivit: - Breakfast: "I am not a breakfast eater": 1 egg + toast; 3 pancakes + sugarfree syrup; waffle; french toast - not skipping anymore  - Lunch: sometimes skips, sometimes eats out: pizza, salad, no dessert - Dinner: at 5:30-6pm: soup, meat + vegetables + starch, dessert - Snacks: 8 pm: fruit, sugar free  She is eating out 4 times a week. At those time, she does not take the mealtime insulin!! She was seen for nutritional advice and diabetes education, with last visit on 04/2012 - [name].  - no CKD, last BUN/creatinine:  Lab Results  Component Value Date   BUN 5* 02/01/2014   CREATININE 0.55 02/01/2014  She is on ACEI. Last microalbumin to creatinine ratio was 19.2 in 09/2012. - last set of lipids: Lab Results  Component Value Date   CHOL 167 06/01/2013   HDL 21* 06/01/2013   LDLCALC 91 06/01/2013   TRIG 275* 06/01/2013   CHOLHDL 8.0 06/01/2013  She is on Pravastatin. - last eye exam was in 07/2013. 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 At last eye exam (Dr Zigmund Daniel): cataracts. - no numbness and tingling in her feet. She is on aspirin 81 mg daily.  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  ROS: Constitutional: no weight gain/loss, + poor sleep Eyes: no blurry vision, no xerophthalmia ENT: no sore throat, no nodules palpated in throat, no dysphagia/odynophagia, no hoarseness; Tinnitus Cardiovascular: no CP/SOB/palpitations/+ leg and hand swelling Respiratory:no  cough/no SOB Gastrointestinal: no N/V/D/no C Musculoskeletal: no muscle/joint aches Skin: no rash; + burning in her feet >> worse Neurological: no  tremors/numbness/tingling/dizziness  I reviewed pt's medications, allergies, PMH, social hx, family hx and no changes required, except as mentioned above.  PE: BP 162/72  Pulse 86  Temp(Src) 97.8 F (36.6 C)  Resp 14  Ht 5' 4.5" (1.638 m)  Wt 194 lb 12.8 oz (88.361 kg)  BMI 32.93 kg/m2  SpO2 91% Wt Readings from Last 3 Encounters:  03/08/14 194 lb 12.8 oz (88.361 kg)  02/28/14 195 lb 8 oz (88.678 kg)  02/08/14 197 lb 4 oz (89.472 kg)   Constitutional: overweight, in NAD Eyes: PERRLA, EOMI, no exophthalmos ENT: moist mucous membranes, no thyromegaly, no cervical lymphadenopathy Cardiovascular: RRR, No MRG Respiratory: CTA B Gastrointestinal: abdomen soft, NT, ND, BS+ Musculoskeletal: no deformities, strength intact in all 4 Skin: moist, warm, no rashes  ASSESSMENT: 1. 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 Patient with long-standing, uncontrolled diabetes, on premixed insulin regimen now, with frequent medication noncompliance due to forgetting to take the insulin doses and due to insurance problems. We are very limited in the choices of her diabetic medications by her ability to afford the medicines and also her intolerances. She and her husband bring a form for me to fill out so she can get the Levemir and Humalog for free (done). - I advised her to: Patient Instructions  Please change the regimen as follows:  - NPH-regular 70/30 - 55-30-55 units with meals - take this 30 min before a meal  When you can start the Levemir and Humalog,  - Levemir 60 units at bedtime - Humalog - take this 15 min before a meal 25 units with a smaller meal 30 units with a larger meal  Start Neurontin 200 mg at night for the neuropathy pain. Can increase to 300 mg if pain persists.  Please return in 1 month with your sugar log.   - advised her to continue checking her sugars at different times of the day - check 3 times  a day, rotating checks - advised for yearly eye exams - she is up to date (Dr. Zigmund Daniel) >> cataracts, no DR - she has PVera >> next time will need A1c and fructosamine - Return to clinic in one month with sugar log

## 2014-03-08 NOTE — Patient Instructions (Addendum)
Please change the regimen as follows:  - NPH-regular 70/30 - 55-30-55 units with meals - take this 30 min before a meal  When you can start the Levemir and Humalog,  - Levemir 60 units at bedtime - Humalog - take this 15 min before a meal 25 units with a smaller meal 30 units with a larger meal  Start Neurontin 200 mg at night for the neuropathy pain. Can increase to 300 mg if pain persists.  Please return in 1 month with your sugar log.

## 2014-03-12 NOTE — Telephone Encounter (Signed)
Made in error

## 2014-03-13 ENCOUNTER — Telehealth: Payer: Self-pay | Admitting: Internal Medicine

## 2014-03-13 NOTE — Telephone Encounter (Signed)
Tried calling patient back, got vm, I left a message for him

## 2014-03-13 NOTE — Telephone Encounter (Signed)
Pt 's husband needs clarification on the different med that was written down for the novo nordisk form she is on novolog but humolog is written on the form pelase assist

## 2014-03-27 ENCOUNTER — Telehealth: Payer: Self-pay | Admitting: *Deleted

## 2014-03-27 NOTE — Telephone Encounter (Signed)
Called pt and lvm advising her that her insulin came in from Novo/Nordisc; pt assistance program. Advised they are able to pick up Mon-Fri 8 - 5 pm.

## 2014-03-29 ENCOUNTER — Ambulatory Visit (INDEPENDENT_AMBULATORY_CARE_PROVIDER_SITE_OTHER): Payer: Medicare HMO | Admitting: Gastroenterology

## 2014-03-29 ENCOUNTER — Telehealth: Payer: Self-pay | Admitting: Internal Medicine

## 2014-03-29 ENCOUNTER — Encounter: Payer: Self-pay | Admitting: Gastroenterology

## 2014-03-29 ENCOUNTER — Other Ambulatory Visit (INDEPENDENT_AMBULATORY_CARE_PROVIDER_SITE_OTHER): Payer: Medicare HMO

## 2014-03-29 VITALS — BP 164/76 | HR 72 | Ht 64.5 in | Wt 196.0 lb

## 2014-03-29 DIAGNOSIS — K7460 Unspecified cirrhosis of liver: Secondary | ICD-10-CM

## 2014-03-29 LAB — CBC WITH DIFFERENTIAL/PLATELET
BASOS ABS: 0.1 10*3/uL (ref 0.0–0.1)
Basophils Relative: 0.4 % (ref 0.0–3.0)
EOS ABS: 0.3 10*3/uL (ref 0.0–0.7)
Eosinophils Relative: 2.1 % (ref 0.0–5.0)
HEMATOCRIT: 45 % (ref 36.0–46.0)
HEMOGLOBIN: 15.1 g/dL — AB (ref 12.0–15.0)
LYMPHS ABS: 1.8 10*3/uL (ref 0.7–4.0)
Lymphocytes Relative: 13.9 % (ref 12.0–46.0)
MCHC: 33.6 g/dL (ref 30.0–36.0)
MCV: 97.2 fl (ref 78.0–100.0)
Monocytes Absolute: 0.6 10*3/uL (ref 0.1–1.0)
Monocytes Relative: 4.8 % (ref 3.0–12.0)
NEUTROS ABS: 10.5 10*3/uL — AB (ref 1.4–7.7)
Neutrophils Relative %: 78.8 % — ABNORMAL HIGH (ref 43.0–77.0)
Platelets: 556 10*3/uL — ABNORMAL HIGH (ref 150.0–400.0)
RBC: 4.63 Mil/uL (ref 3.87–5.11)
RDW: 19.5 % — ABNORMAL HIGH (ref 11.5–15.5)
WBC: 13.3 10*3/uL — ABNORMAL HIGH (ref 4.0–10.5)

## 2014-03-29 LAB — COMPREHENSIVE METABOLIC PANEL
ALT: 19 U/L (ref 0–35)
AST: 30 U/L (ref 0–37)
Albumin: 4 g/dL (ref 3.5–5.2)
Alkaline Phosphatase: 91 U/L (ref 39–117)
BUN: 14 mg/dL (ref 6–23)
CO2: 25 mEq/L (ref 19–32)
CREATININE: 0.6 mg/dL (ref 0.4–1.2)
Calcium: 9.7 mg/dL (ref 8.4–10.5)
Chloride: 100 mEq/L (ref 96–112)
GFR: 99.85 mL/min (ref >60.00)
Glucose, Bld: 403 mg/dL — ABNORMAL HIGH (ref 70–99)
Potassium: 3.9 mEq/L (ref 3.5–5.1)
Sodium: 134 mEq/L — ABNORMAL LOW (ref 135–145)
Total Bilirubin: 0.7 mg/dL (ref 0.2–1.2)
Total Protein: 7.1 g/dL (ref 6.0–8.3)

## 2014-03-29 LAB — PROTIME-INR
INR: 1.1 ratio — ABNORMAL HIGH (ref 0.8–1.0)
PROTHROMBIN TIME: 12.4 s (ref 9.6–13.1)

## 2014-03-29 LAB — IGA: IgA: 233 mg/dL (ref 68–378)

## 2014-03-29 LAB — FERRITIN: Ferritin: 66.7 ng/mL (ref 10.0–291.0)

## 2014-03-29 LAB — IRON: IRON: 96 ug/dL (ref 42–145)

## 2014-03-29 NOTE — Telephone Encounter (Signed)
Please call pt with direction on how to take the insulin she just picked up

## 2014-03-29 NOTE — Telephone Encounter (Signed)
Called pt and advised her how to take her insulin. Pt understood.

## 2014-03-29 NOTE — Patient Instructions (Addendum)
We will get records sent from your previous gastroenterologist (Dr. Oletta Lamas at [facility]) for review.  This will include any endoscopic (colonoscopy or upper endoscopy) procedures and any associated pathology reports.   You will be set up for an upper endoscopy, scrrening for varicies. You will get labs drawn today:  Hepatitis A (IgM and IgG), Hepatitis B surface antigen, Hepatitis B surface antibody, Hepatitis C antibody, total iron, ferritin, TIBC, ANA, AMA, alphafeto protein (AFP), anti smooth muscle antibody, TTG, total IgA level, CBC, CMET, INR. Take one imodium pill shortly after waking every morning. Please return to see Dr. [name] in  6-7 weeks.

## 2014-03-29 NOTE — Progress Notes (Signed)
Review of pertinent gastrointestinal problems: 1. Cirrhosis; discovered by imaging 01/2014 (labs 01/2014 normal plts, normal INR, normal bili, Alb slightly low)  Most recent imaging: CT 01/2014 cirrhosis without focal liver lesions, [place] 01/2014 cirrhosis, splenomegaly, + gallstones 2. C. Diff colitis: diarrhea following abx for buttocks abscess 2015; C. Diff PCR +, C. Diff toxin A/B positive; treated with flagyl, vanc   HPI: This is a   very pleasant 73 year old woman whom I am meeting for the first time today. She was here in the office a month ago and she saw Amy Ester would.  Her bowels are still very loose.  She will have 2-6 times per day.  Solid stool now. No longer loose watery anymore.  Nonbloody  She is trying to loose weight 18 pounds since November.  She has had no overt GI bleeding. She does have trouble with swelling in her legs.     Past Medical History  Diagnosis Date  . Hypertension   . Hyperlipidemia   . Diabetes mellitus 2008  . Depression   . Obesity   . Psoriasis   . Polycythemia     Dr. Elease Hashimoto- [facility]  . Herpes simplex   . Gout   . Hyperglycemia 05/31/2013  . C. difficile colitis   . Cirrhosis     Past Surgical History  Procedure Laterality Date  . Breast surgery Left     milk duct  . Total abdominal hysterectomy w/ bilateral salpingoophorectomy      for heavy periods with appendectomy  . Tonsillectomy      Current Outpatient Prescriptions  Medication Sig Dispense Refill  . aspirin EC 81 MG tablet Take 81 mg by mouth daily.      . B-D UF III MINI PEN NEEDLES 31G X 5 MM MISC       . cetaphil (CETAPHIL) lotion Apply topically daily.  236 mL  0  . dicyclomine (BENTYL) 10 MG capsule Take 10 mg by mouth 4 (four) times daily -  before meals and at bedtime.      . diphenhydrAMINE (BENADRYL) 25 mg capsule Take 25 mg by mouth every 6 (six) hours as needed.      . gabapentin (NEURONTIN) 100 MG capsule Take 2 tablets at bedtime as needed for pain. Can  increase to 3 tablets if pain persists.  90 capsule  2  . HYDROcodone-acetaminophen (NORCO/VICODIN) 5-325 MG per tablet Take 2 tablets by mouth every 4 (four) hours as needed for moderate pain.      . hydrocortisone 2.5 % cream Apply 1 application topically 2 (two) times daily.      . hydroxyurea (HYDREA) 500 MG capsule Take 1 capsule (500 mg total) by mouth 2 (two) times daily.  60 capsule  6  . ibuprofen (ADVIL,MOTRIN) 200 MG tablet Take 200 mg by mouth every 6 (six) hours as needed for moderate pain.      . Insulin Detemir (LEVEMIR) 100 UNIT/ML Pen Inject 60 units under skin at bedtime  10 pen  11  . insulin lispro (HUMALOG) 100 UNIT/ML injection Inject up to 90 units a day as advised  90 mL  3  . lisinopril (PRINIVIL,ZESTRIL) 40 MG tablet Take 1 tablet (40 mg total) by mouth daily.  90 tablet  3  . metoprolol (LOPRESSOR) 100 MG tablet Take 100 mg by mouth 2 (two) times daily.      Marland Kitchen nystatin cream (MYCOSTATIN) Apply 1 application topically 2 (two) times daily.      . ondansetron (  ZOFRAN) 8 MG tablet Take 8 mg by mouth every 8 (eight) hours as needed for nausea or vomiting.      . pravastatin (PRAVACHOL) 40 MG tablet Take 1 tablet (40 mg total) by mouth daily.  90 tablet  0  . Probiotic Product (PROBIOTIC DAILY PO) Take by mouth daily.      Marland Kitchen saccharomyces boulardii (FLORASTOR) 250 MG capsule Take 1 capsule (250 mg total) by mouth 2 (two) times daily.  60 capsule  1   No current facility-administered medications for this visit.    Allergies as of 03/29/2014 - Review Complete 03/29/2014  Allergen Reaction Noted  . Tape Hives 01/28/2014  . Doxycycline Hives, Swelling, and Rash   . Latex Hives, Itching, and Rash 09/07/2011    Family History  Problem Relation Age of Onset  . Diabetes Mother   . Heart disease Mother     CAD  . Hyperlipidemia Mother   . Hypertension Mother   . Kidney disease Mother   . Kidney disease Father   . Breast cancer Maternal Aunt   . Heart disease Maternal  Grandmother   . Heart disease Other     maternal aunts and uncles    History   Social History  . Marital Status: Married    Spouse Name: N/A    Number of Children: 2  . Years of Education: N/A   Occupational History  . housewife    Social History Main Topics  . Smoking status: Former Smoker -- 48 years    Types: Cigarettes    Quit date: 10/18/1986  . Smokeless tobacco: Never Used  . Alcohol Use: No  . Drug Use: No  . Sexual Activity: Yes    Birth Control/ Protection: None   Other Topics Concern  . Not on file   Social History Narrative  . No narrative on file      Physical Exam: BP 164/76  Pulse 72  Ht 5' 4.5" (1.638 m)  Wt 196 lb (88.905 kg)  BMI 33.14 kg/m2 Constitutional: generally well-appearing Psychiatric: alert and oriented x3 Abdomen: soft, nontender, nondistended, no obvious ascites, no peritoneal signs, normal bowel sounds No lower extremity edema    Assessment and plan: 73 y.o. female with improving Clostridium difficile colitis, likely postinfectious loose stools, incidental cirrhosis  First I think that she is very unlikely to still be infected with Clostridium difficile.  She has had many years of loose stools although now certainly a bit worse in usual. I'm recommending she started Imodium every morning shortly after she wakes see if that can help her likely postinfectious bowel troubles. She has cirrhosis, she was never an alcohol drinker. Perhaps this is fatty liver disease related. We are going to start her cirrhosis etiology workup with a battery of blood tests summarized below. She will also have an EGD to screen her for varicose veins. She'll return to see me in the office in 7-8 weeks and sooner if needed.

## 2014-03-30 LAB — HEPATITIS A ANTIBODY, TOTAL: Hep A Total Ab: NONREACTIVE

## 2014-03-30 LAB — IRON AND TIBC
%SAT: 31 % (ref 20–55)
Iron: 96 ug/dL (ref 42–145)
TIBC: 309 ug/dL (ref 250–470)
UIBC: 213 ug/dL (ref 125–400)

## 2014-03-30 LAB — AFP TUMOR MARKER: AFP-Tumor Marker: 3.5 ng/mL (ref 0.0–8.0)

## 2014-03-30 LAB — HEPATITIS B SURFACE ANTIBODY,QUALITATIVE: Hep B S Ab: NEGATIVE

## 2014-03-30 LAB — HEPATITIS C ANTIBODY: HCV Ab: NEGATIVE

## 2014-03-30 LAB — HEPATITIS B SURFACE ANTIGEN: HEP B S AG: NEGATIVE

## 2014-04-01 LAB — ANTI-SMOOTH MUSCLE ANTIBODY, IGG: Smooth Muscle Ab: 5 U (ref <20)

## 2014-04-01 LAB — T-TRANSGLUTAMINASE (TTG) IGG: Tissue Transglut Ab: <2 U/mL (ref 0–5)

## 2014-04-01 LAB — ANA: Anti Nuclear Antibody(ANA): NEGATIVE

## 2014-04-01 LAB — MITOCHONDRIAL ANTIBODIES: Mitochondrial M2 Ab, IgG: 0.28 (ref <0.91)

## 2014-04-03 ENCOUNTER — Other Ambulatory Visit: Payer: Self-pay

## 2014-04-03 DIAGNOSIS — Z1231 Encounter for screening mammogram for malignant neoplasm of breast: Secondary | ICD-10-CM

## 2014-04-04 ENCOUNTER — Ambulatory Visit: Payer: Medicare HMO | Admitting: Hematology and Oncology

## 2014-04-04 ENCOUNTER — Telehealth: Payer: Self-pay | Admitting: *Deleted

## 2014-04-04 ENCOUNTER — Other Ambulatory Visit: Payer: Medicare HMO

## 2014-04-04 NOTE — Telephone Encounter (Signed)
Phone call was sent directly to [facility] admitting.  Patient wants to know how she should take her insulin.  She takes Novolog and Lantus.  I advised her to not take the Novolog the day before or morning of procedure and to half the dose of Lantus the evening before and none the day of.  She understood and all questions were answered.

## 2014-04-05 ENCOUNTER — Other Ambulatory Visit: Payer: Self-pay

## 2014-04-05 DIAGNOSIS — K7460 Unspecified cirrhosis of liver: Secondary | ICD-10-CM

## 2014-04-08 ENCOUNTER — Encounter: Payer: Self-pay | Admitting: Gastroenterology

## 2014-04-08 ENCOUNTER — Ambulatory Visit (AMBULATORY_SURGERY_CENTER): Payer: Medicare HMO | Admitting: Gastroenterology

## 2014-04-08 VITALS — BP 181/94 | HR 77 | Temp 98.2°F | Resp 21 | Ht 64.5 in | Wt 196.0 lb

## 2014-04-08 DIAGNOSIS — K2289 Other specified disease of esophagus: Secondary | ICD-10-CM

## 2014-04-08 DIAGNOSIS — K229 Disease of esophagus, unspecified: Secondary | ICD-10-CM

## 2014-04-08 DIAGNOSIS — K449 Diaphragmatic hernia without obstruction or gangrene: Secondary | ICD-10-CM

## 2014-04-08 DIAGNOSIS — K7460 Unspecified cirrhosis of liver: Secondary | ICD-10-CM

## 2014-04-08 DIAGNOSIS — K2990 Gastroduodenitis, unspecified, without bleeding: Secondary | ICD-10-CM

## 2014-04-08 DIAGNOSIS — K228 Other specified diseases of esophagus: Secondary | ICD-10-CM

## 2014-04-08 DIAGNOSIS — K319 Disease of stomach and duodenum, unspecified: Secondary | ICD-10-CM

## 2014-04-08 DIAGNOSIS — K2970 Gastritis, unspecified, without bleeding: Secondary | ICD-10-CM

## 2014-04-08 LAB — GLUCOSE, CAPILLARY
GLUCOSE-CAPILLARY: 215 mg/dL — AB (ref 70–99)
GLUCOSE-CAPILLARY: 214 mg/dL — AB (ref 70–99)

## 2014-04-08 MED ORDER — SODIUM CHLORIDE 0.9 % IV SOLN: 500.0000 mL | INTRAVENOUS | Status: DC

## 2014-04-08 NOTE — Op Note (Signed)
[facility]  [address]. [place], [id number]   ENDOSCOPY PROCEDURE REPORT  PATIENT: Amanda, [name]  MR#: [id number] BIRTHDATE: [birth year] , 15  yrs. old GENDER: Female ENDOSCOPIST: Milus Banister, MD REFERRED BY:  Emi Belfast, M.D. 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 MEDICATIONS: MAC sedation, administered by CRNA and propofol (Diprivan) 13m IV TOPICAL ANESTHETIC: none  DESCRIPTION OF PROCEDURE: After the risks benefits and alternatives of the procedure were thoroughly explained, informed consent was obtained.  The LB [id number] was introduced through the mouth and advanced to the second portion of the duodenum. Without limitations.  The instrument was slowly withdrawn as the mucosa was fully examined.    There was moderate, non-specific pangastritis.  The distal stomach was biopsied and sent to pathology (jar 1).  There was a small hiatal hernia.  The Z-line was slightly irregular, biopied and sent to pathology (jar 2).  There were no esophageal varices.  There were no gastric varices or portal gastropathy.  The examination was otherwise normal.  Retroflexed views revealed no abnormalities. The scope was then withdrawn from the patient and the procedure completed.  COMPLICATIONS: There were no complications. ENDOSCOPIC IMPRESSION: There was moderate, non-specific pangastritis.  The distal stomach was biopsied and sent to pathology (jar 1).  There was a small hiatal hernia.  The Z-line was slightly irregular, biopied and sent to pathology (jar 2).  There were no esophageal varices.  There were no gastric varices or portal gastropathy.  The examination was otherwise normal.  RECOMMENDATIONS: Await pathology results My office will get in touch with you to set up follow up visit in 6-8  weeks.   eSigned:  DMilus Banister MD 04/08/2014 2:14 PM

## 2014-04-08 NOTE — Progress Notes (Signed)
Report to PACU, RN, vss, BBS= Clear.  

## 2014-04-08 NOTE — Progress Notes (Signed)
Called to room to assist during endoscopic procedure.  Patient ID and intended procedure confirmed with present staff. Received instructions for my participation in the procedure from the performing physician.  

## 2014-04-08 NOTE — Patient Instructions (Signed)
YOU HAD AN ENDOSCOPIC PROCEDURE TODAY AT [facility]: Refer to the procedure report that was given to you for any specific questions about what was found during the examination.  If the procedure report does not answer your questions, please call your gastroenterologist to clarify.  If you requested that your care partner not be given the details of your procedure findings, then the procedure report has been included in a sealed envelope for you to review at your convenience later.  YOU SHOULD EXPECT: Some feelings of bloating in the abdomen. Passage of more gas than usual.  Walking can help get rid of the air that was put into your GI tract during the procedure and reduce the bloating. If you had a lower endoscopy (such as a colonoscopy or flexible sigmoidoscopy) you may notice spotting of blood in your stool or on the toilet paper. If you underwent a bowel prep for your procedure, then you may not have a normal bowel movement for a few days.  DIET: Your first meal following the procedure should be a light meal and then it is ok to progress to your normal diet.  A half-sandwich or bowl of soup is an example of a good first meal.  Heavy or fried foods are harder to digest and may make you feel nauseous or bloated.  Likewise meals heavy in dairy and vegetables can cause extra gas to form and this can also increase the bloating.  Drink plenty of fluids but you should avoid alcoholic beverages for 24 hours.  ACTIVITY: Your care partner should take you home directly after the procedure.  You should plan to take it easy, moving slowly for the rest of the day.  You can resume normal activity the day after the procedure however you should NOT DRIVE or use heavy machinery for 24 hours (because of the sedation medicines used during the test).    SYMPTOMS TO REPORT IMMEDIATELY: A gastroenterologist can be reached at any hour.  During normal business hours, 8:30 AM to 5:00 PM Monday through Friday,  call [phone].  After hours and on weekends, please call the GI answering service at [phone] who will take a message and have the physician on call contact you.   Following upper endoscopy (EGD)  Vomiting of blood or coffee ground material  New chest pain or pain under the shoulder blades  Painful or persistently difficult swallowing  New shortness of breath  Fever of 100F or higher  Black, tarry-looking stools  FOLLOW UP: If any biopsies were taken you will be contacted by phone or by letter within the next 1-3 weeks.  Call your gastroenterologist if you have not heard about the biopsies in 3 weeks.  Our staff will call the home number listed on your records the next business day following your procedure to check on you and address any questions or concerns that you may have at that time regarding the information given to you following your procedure. This is a courtesy call and so if there is no answer at the home number and we have not heard from you through the emergency physician on call, we will assume that you have returned to your regular daily activities without incident.  SIGNATURES/CONFIDENTIALITY: You and/or your care partner have signed paperwork which will be entered into your electronic medical record.  These signatures attest to the fact that that the information above on your After Visit Summary has been reviewed and is understood.  Full responsibility  of the confidentiality of this discharge information lies with you and/or your care-partner.  You have Gastritis and a hiatal Hernia.  The staff on the 3rd floor will make you an appointment for 6-8 weeks with Dr. [name].  They will call you.

## 2014-04-09 ENCOUNTER — Telehealth: Payer: Self-pay | Admitting: *Deleted

## 2014-04-09 NOTE — Telephone Encounter (Signed)
  Follow up Call-  Call back number 04/08/2014  Post procedure Call Back phone  # [phone]  Permission to leave phone message Yes     Patient questions:  Do you have a fever, pain , or abdominal swelling? no Pain Score  0 *  Have you tolerated food without any problems? yes  Have you been able to return to your normal activities? yes  Do you have any questions about your discharge instructions: Diet   no Medications  no Follow up visit  no  Do you have questions or concerns about your Care? no  Actions: * If pain score is 4 or above: No action needed, pain <4.

## 2014-04-10 ENCOUNTER — Ambulatory Visit (INDEPENDENT_AMBULATORY_CARE_PROVIDER_SITE_OTHER): Payer: Medicare HMO | Admitting: Internal Medicine

## 2014-04-10 ENCOUNTER — Encounter: Payer: Self-pay | Admitting: Internal Medicine

## 2014-04-10 VITALS — BP 130/66 | HR 100 | Temp 98.1°F | Resp 12 | Wt 194.0 lb

## 2014-04-10 DIAGNOSIS — E1149 Type 2 diabetes mellitus with other diabetic neurological complication: Secondary | ICD-10-CM

## 2014-04-10 MED ORDER — INSULIN LISPRO 100 UNIT/ML ~~LOC~~ SOLN: SUBCUTANEOUS | Status: DC

## 2014-04-10 MED ORDER — INSULIN DETEMIR 100 UNIT/ML FLEXPEN
PEN_INJECTOR | SUBCUTANEOUS | Status: DC
Start: 2014-04-10 — End: 2014-07-01

## 2014-04-10 NOTE — Progress Notes (Signed)
Patient ID: Amanda [name], female   DOB: 02-27-1941, 73 y.o.   MRN: [id number]  HPI: Amanda [name] is a 73 y.o.-year-old female, returning for f/u for DM2, dx 2008, insulin-dependent since dx, uncontrolled, with complications (DR, PN). Last visit 1 mo ago.   Last hemoglobin A1c was: Lab Results  Component Value Date   HGBA1C 13.5* 01/31/2014   HGBA1C 12.2* 05/31/2013   HGBA1C 12.8 11/16/2010  - in 10/16/2012, hemoglobin A1c was 6.9% - in 07/2012, hemoglobin A1c was 9.9% - in 11/16/2010, hemoglobin A1c was 12.8% - in 03/2011, hemoglobin A1c was 6.9% - In 07/10/2010 her hemoglobin A1c was 10.5% C-peptide was 6.3 and 01/2011.  Pt was on a regimen of: - NPH-regular (novolin) 70/30 - 50-20-50 units with meals - syringes  - takes this 10-15 min before a meal  She is now on - but was off the insulin x 2 weeks - restarted last week: - Levemir 60 units at bedtime - NovoLog 25 units  She could not tolerate doses of metformin XR higher than 250 mg twice a day in the past due to diarrhea. She does not want to resume this. Was previously on the Victoza 1.8 mg daily - "made me sick". She was previously on Janumet between 2012-2013. She was on Invokana - but could not afford it while in the Medicare doughnut hole. Per Dr. Darryl Nestle notes, patient was missing approximately 4 doses of insulin a week when eating out. Also, per his review of pharmacy records in 07/2012 this showed incomplete adherence to some or all of her medicines, although she claimed full burins other than missing lunchtime insulin doses half of the time.  Pt checks her sugars 1-3x a day and they were high when off insulin (~280) - no log: - am: 190-304 >> 210-400 (lowest 140) >> 143-191 >> 136-178 >> 190-255 - 2h after b'fast: n/c - before lunch: 220-290 >> 200-300s >> 140, 223, 247 >> 161-319 >> 258-290 - 2h after lunch: 240-312 >> n/c - before dinner: 140-305 >> 200-300s >> 206, 260 >> 150-330 >> 150-170 - bedtime: 152-306 >>  n/c No lows. Lowest sugar was 150 in last mo; she has hypoglycemia awareness at 100. Highest sugar was 300s.  Pt's meals are - reviewed per last vivit: - Breakfast: "I am not a breakfast eater": 1 egg + toast; 3 pancakes + sugarfree syrup; waffle; french toast - not skipping anymore  - Lunch: sometimes skips, sometimes eats out: pizza, salad, no dessert - Dinner: at 5:30-6pm: soup, meat + vegetables + starch, dessert - Snacks: 8 pm: fruit, sugar free  She is eating out 4 times a week. At those time, she does not take the mealtime insulin!! She was seen for nutritional advice and diabetes education, with last visit on 04/2012 - [name].  - no CKD, last BUN/creatinine:  Lab Results  Component Value Date   BUN 14 03/29/2014   CREATININE 0.6 03/29/2014  She is on ACEI. Last microalbumin to creatinine ratio was 19.2 in 09/2012. - last set of lipids: Lab Results  Component Value Date   CHOL 167 06/01/2013   HDL 21* 06/01/2013   LDLCALC 91 06/01/2013   TRIG 275* 06/01/2013   CHOLHDL 8.0 06/01/2013  She is on Pravastatin. - last eye exam was in 07/2013. 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 At last eye exam (Dr Zigmund Daniel): cataracts. - no numbness and tingling in her feet. She is on aspirin 81 mg daily.  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  ROS: Constitutional: no weight gain/loss, no fatigue Eyes: no blurry vision, no xerophthalmia ENT: no sore throat, no nodules palpated in throat, no dysphagia/odynophagia, no hoarseness; +Tinnitus Cardiovascular: no CP/SOB/palpitations/leg and hand swelling Respiratory:no  cough/no SOB Gastrointestinal: no N/V/+D/no C Musculoskeletal: no muscle/joint aches Skin: no rash; + burning in her feet Neurological: no tremors/numbness/tingling/dizziness  I  reviewed pt's medications, allergies, PMH, social hx, family hx and no changes required, except as mentioned above.  PE: BP 130/66  Pulse 100  Temp(Src) 98.1 F (36.7 C) (Oral)  Resp 12  Wt 194 lb (87.998 kg)  SpO2 95% Wt Readings from Last 3 Encounters:  04/10/14 194 lb (87.998 kg)  04/08/14 196 lb (88.905 kg)  03/29/14 196 lb (88.905 kg)   Constitutional: overweight, in NAD Eyes: PERRLA, EOMI, no exophthalmos ENT: moist mucous membranes, no thyromegaly, no cervical lymphadenopathy Cardiovascular: RRR, No MRG Respiratory: CTA B Gastrointestinal: abdomen soft, NT, ND, BS+ Musculoskeletal: no deformities, strength intact in all 4 Skin: moist, warm, no rashes  ASSESSMENT: 1. 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 Patient with long-standing, uncontrolled diabetes, with medication noncompliance due to finances and forgetting to take the insulin doses. We are very limited in the choices of her diabetic medications by her ability to afford the medicines and also her intolerances.  - I advised her to increase mealtime Humalog and add a sliding scale: Patient Instructions  Please continue Levemir 60 units at bedtime Increase Humalog (take this 15 min before a meal - do not skip doses) 30 units with a smaller meal 35 units with a larger meal Add the following sliding scale of Humalog: - 150- 165: + 1 unit  - 166- 180: + 2 units  - 181- 195: + 3 units  - 196- 210: + 4 units  - 211- 225: + 5 units  - 226- 240: + 6 units  - >240: + 7 units   Please return in 1 month with your sugar log.   - advised her to continue checking her sugars at different times of the day - check 3 times a day, rotating checks - advised for yearly eye exams - she is up to date (Dr. Zigmund Daniel) >> cataracts, no DR - she has PVera >> next time will need A1c and fructosamine - Return to clinic in one month with sugar log

## 2014-04-10 NOTE — Patient Instructions (Signed)
Please continue Levemir 60 units at bedtime Increase Humalog (take this 15 min before a meal - do not skip doses) 30 units with a smaller meal 35 units with a larger meal Add the following sliding scale of Humalog: - 150- 165: + 1 unit  - 166- 180: + 2 units  - 181- 195: + 3 units  - 196- 210: + 4 units  - 211- 225: + 5 units  - 226- 240: + 6 units  - >240: + 7 units   Please return in 1 month with your sugar log.

## 2014-04-12 ENCOUNTER — Ambulatory Visit (INDEPENDENT_AMBULATORY_CARE_PROVIDER_SITE_OTHER): Payer: Medicare HMO | Admitting: Gastroenterology

## 2014-04-12 DIAGNOSIS — K7460 Unspecified cirrhosis of liver: Secondary | ICD-10-CM

## 2014-04-12 DIAGNOSIS — Z23 Encounter for immunization: Secondary | ICD-10-CM

## 2014-04-16 ENCOUNTER — Ambulatory Visit
Admission: RE | Admit: 2014-04-16 | Discharge: 2014-04-16 | Disposition: A | Discharge status: Home / Self Care | Payer: Medicare HMO | Source: Ambulatory Visit

## 2014-04-16 ENCOUNTER — Encounter: Payer: Self-pay | Admitting: Gastroenterology

## 2014-04-16 DIAGNOSIS — Z1231 Encounter for screening mammogram for malignant neoplasm of breast: Secondary | ICD-10-CM

## 2014-04-22 ENCOUNTER — Ambulatory Visit (INDEPENDENT_AMBULATORY_CARE_PROVIDER_SITE_OTHER): Payer: Medicare HMO | Admitting: Gastroenterology

## 2014-04-22 DIAGNOSIS — K7460 Unspecified cirrhosis of liver: Secondary | ICD-10-CM

## 2014-04-22 DIAGNOSIS — Z23 Encounter for immunization: Secondary | ICD-10-CM

## 2014-05-09 ENCOUNTER — Ambulatory Visit (INDEPENDENT_AMBULATORY_CARE_PROVIDER_SITE_OTHER): Payer: Medicare HMO | Admitting: Gastroenterology

## 2014-05-09 DIAGNOSIS — Z23 Encounter for immunization: Secondary | ICD-10-CM

## 2014-05-09 DIAGNOSIS — K7460 Unspecified cirrhosis of liver: Secondary | ICD-10-CM

## 2014-05-14 ENCOUNTER — Ambulatory Visit (INDEPENDENT_AMBULATORY_CARE_PROVIDER_SITE_OTHER): Payer: Medicare HMO | Admitting: Internal Medicine

## 2014-05-14 ENCOUNTER — Ambulatory Visit (HOSPITAL_BASED_OUTPATIENT_CLINIC_OR_DEPARTMENT_OTHER)
Admission: RE | Admit: 2014-05-14 | Discharge: 2014-05-14 | Disposition: A | Discharge status: Home / Self Care | Payer: Medicare HMO | Source: Ambulatory Visit | Attending: Internal Medicine | Admitting: Internal Medicine

## 2014-05-14 ENCOUNTER — Encounter: Payer: Self-pay | Admitting: Internal Medicine

## 2014-05-14 VITALS — BP 153/79 | HR 82 | Temp 97.2°F | Ht 64.0 in | Wt 196.0 lb

## 2014-05-14 DIAGNOSIS — M7730 Calcaneal spur, unspecified foot: Secondary | ICD-10-CM | POA: Diagnosis not present

## 2014-05-14 DIAGNOSIS — M25579 Pain in unspecified ankle and joints of unspecified foot: Secondary | ICD-10-CM | POA: Diagnosis present

## 2014-05-14 DIAGNOSIS — E1149 Type 2 diabetes mellitus with other diabetic neurological complication: Secondary | ICD-10-CM

## 2014-05-14 DIAGNOSIS — M25572 Pain in left ankle and joints of left foot: Secondary | ICD-10-CM

## 2014-05-14 DIAGNOSIS — R197 Diarrhea, unspecified: Secondary | ICD-10-CM

## 2014-05-14 LAB — URIC ACID: Uric Acid, Serum: 6.2 mg/dL (ref 2.4–7.0)

## 2014-05-14 MED ORDER — MELOXICAM 7.5 MG PO TABS: 7.5000 mg | ORAL_TABLET | Freq: Every day | ORAL | Status: DC

## 2014-05-14 NOTE — Patient Instructions (Signed)
To xray and lab today

## 2014-05-14 NOTE — Progress Notes (Signed)
Subjective:    Patient ID: Amanda [name], female    DOB: 1941/09/20, 73 y.o.   MRN: [id number]  HPI  [name] is here for follow up  She has had extensive eval by Dr. [name] for etiology of cirrhosis.  EGD done  No ulcer but some suggestion of portal gastropathy.    Glucoses at home  "up and down"  I do think there is a lot of dietary discretion  She reports 2 weeks of left great toe pain and redness.  Wakes up at night cannot keep covers on at night. .  She has tried gabapentin but this did not help.  NO injury or trauma.   No fever.  Allergies  Allergen Reactions  . Tape Hives  . Doxycycline Hives, Swelling and Rash  . Latex Hives, Itching and Rash   Past Medical History  Diagnosis Date  . Hypertension   . Hyperlipidemia   . Diabetes mellitus 2008  . Depression   . Obesity   . Psoriasis   . Polycythemia     Dr. Elease Hashimoto- [facility]  . Herpes simplex   . Gout   . Hyperglycemia 05/31/2013  . C. difficile colitis   . Cirrhosis   . Cancer     SKIN  . Neuromuscular disorder     BELL PALSY   Past Surgical History  Procedure Laterality Date  . Breast surgery Left     milk duct  . Total abdominal hysterectomy w/ bilateral salpingoophorectomy      for heavy periods with appendectomy  . Tonsillectomy    . Colonoscopy     History   Social History  . Marital Status: Married    Spouse Name: N/A    Number of Children: 2  . Years of Education: N/A   Occupational History  . housewife    Social History Main Topics  . Smoking status: Former Smoker -- 48 years    Types: Cigarettes    Quit date: 10/18/1986  . Smokeless tobacco: Never Used  . Alcohol Use: No  . Drug Use: No  . Sexual Activity: Yes    Birth Control/ Protection: None   Other Topics Concern  . Not on file   Social History Narrative  . No narrative on file   Family History  Problem Relation Age of Onset  . Diabetes Mother   . Heart disease Mother     CAD  . Hyperlipidemia Mother   .  Hypertension Mother   . Kidney disease Mother   . Kidney disease Father   . Breast cancer Maternal Aunt   . Heart disease Maternal Grandmother   . Heart disease Other     maternal aunts and uncles   Patient Active Problem List   Diagnosis Date Noted  . Cholelithiasis 02/07/2014  . Clostridium difficile colitis 02/05/2014  . UTI (lower urinary tract infection) 01/29/2014  . Dizziness 01/29/2014  . Diarrhea 01/29/2014  . Dehydration 01/29/2014  . Candidiasis of genitalia in female 11/19/2013  . Cellulitis and abscess of buttock, left 11/01/2013  . Edema 07/09/2013  . Constipation 07/09/2013  . Type II or unspecified type diabetes mellitus with neurological manifestations, uncontrolled 06/07/2013  . Leukocytosis, unspecified 06/02/2013  . Bacteria in urine 06/01/2013  . Hyperglycemia 05/31/2013  . Gout 05/31/2013  . 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 HTN (hypertension) 05/31/2013  . History of Bell's palsy 05/26/2013  . DERMATITIS, ATOPIC 11/16/2010  . DIZZINESS 11/16/2010  . WEIGHT GAIN 11/16/2010  . HERPETIC WHITLOW 06/30/2010  .  HYPERGLYCEMIA 05/07/2010  . DIASTOLIC DYSFUNCTION 28/36/6294  . DYSPNEA 02/17/2010  . ABSCESS, SKIN 12/22/2009  . SKIN RASH 12/22/2009  . HYPERLIPIDEMIA 12/15/2009  . POLYCYTHEMIA 11/24/2009  . Essential hypertension, benign 11/24/2009  . PSORIASIS 11/24/2009  . Depressive disorder, not elsewhere classified 09/13/2009  . ABSCESS, LEG 09/13/2009  . SPRAIN/STRAIN, ANKLE NOS 01/21/2009   Current Outpatient Prescriptions on File Prior to Visit  Medication Sig Dispense Refill  . aspirin EC 81 MG tablet Take 81 mg by mouth daily.      . B-D UF III MINI PEN NEEDLES 31G X 5 MM MISC       . cetaphil (CETAPHIL) lotion Apply topically daily.  236 mL  0  . dicyclomine (BENTYL) 10 MG capsule Take 10 mg by mouth 4 (four) times daily -  before meals and at bedtime.      . diphenhydrAMINE (BENADRYL) 25 mg capsule Take 25 mg by mouth every 6 (six) hours  as needed.      . gabapentin (NEURONTIN) 100 MG capsule Take 2 tablets at bedtime as needed for pain. Can increase to 3 tablets if pain persists.  90 capsule  2  . HYDROcodone-acetaminophen (NORCO/VICODIN) 5-325 MG per tablet Take 2 tablets by mouth every 4 (four) hours as needed for moderate pain.      . hydrocortisone 2.5 % cream Apply 1 application topically 2 (two) times daily.      . hydroxyurea (HYDREA) 500 MG capsule Take 1 capsule (500 mg total) by mouth 2 (two) times daily.  60 capsule  6  . ibuprofen (ADVIL,MOTRIN) 200 MG tablet Take 200 mg by mouth every 6 (six) hours as needed for moderate pain.      . Insulin Detemir (LEVEMIR) 100 UNIT/ML Pen Inject 60 units under skin at bedtime  10 pen  11  . insulin lispro (HUMALOG) 100 UNIT/ML injection Inject up to 90 units a day as advised  90 mL  3  . lisinopril (PRINIVIL,ZESTRIL) 40 MG tablet Take 1 tablet (40 mg total) by mouth daily.  90 tablet  3  . metoprolol (LOPRESSOR) 100 MG tablet Take 100 mg by mouth 2 (two) times daily.      Marland Kitchen nystatin cream (MYCOSTATIN) Apply 1 application topically 2 (two) times daily.      . ondansetron (ZOFRAN) 8 MG tablet Take 8 mg by mouth every 8 (eight) hours as needed for nausea or vomiting.      . pravastatin (PRAVACHOL) 40 MG tablet Take 1 tablet (40 mg total) by mouth daily.  90 tablet  0  . Probiotic Product (PROBIOTIC DAILY PO) Take by mouth daily.      Marland Kitchen saccharomyces boulardii (FLORASTOR) 250 MG capsule Take 1 capsule (250 mg total) by mouth 2 (two) times daily.  60 capsule  1   No current facility-administered medications on file prior to visit.      Review of Systems See HPI    Objective:   Physical Exam Physical Exam  Nursing note and vitals reviewed.  Constitutional: She is oriented to person, place, and time. She appears well-developed and well-nourished.  HENT:  Head: Normocephalic and atraumatic.  Cardiovascular: Normal rate and regular rhythm. Exam reveals no gallop and no friction  rub.  No murmur heard.  Pulmonary/Chest: Breath sounds normal. She has no wheezes. She has no rales.  Neurological: She is alert and oriented to person, place, and time.  Skin: Skin is warm and dry.  Left foot  Pain with minimal palpation left  great toe minimal redness and edema Psychiatric: She has a normal mood and affect. Her behavior is normal.              Assessment & Plan:  Pain Left great toe  Will check uric acid and get imaging of foot.   May represent early gout  She tells me gabapentin has not helped.   Will  Place on Mobic for now given no ulcer seen on EGD.  If gout colchicine a challenge as pt has diarrhea and prednisone will clearly worsen her already poorly controlled glucoses.   Cirrhosis  Followed by her GI Md  Chronic diarrhea followed by GI  Diabetes  Uncontrolled

## 2014-05-15 ENCOUNTER — Ambulatory Visit: Payer: Medicare HMO | Admitting: Internal Medicine

## 2014-05-15 ENCOUNTER — Telehealth: Payer: Self-pay | Admitting: Internal Medicine

## 2014-05-15 NOTE — Telephone Encounter (Signed)
Mandy  Call pt and let her know that her foot xray only shows arthritis in her toe.  Her gout blood test is normal  - no evidence of gout at this point.     Give her a follow up appt with me last week  Of August  Give 30 min appt

## 2014-05-15 NOTE — Telephone Encounter (Signed)
Called pt to adv results - pt already has appt scheduled

## 2014-05-15 NOTE — Telephone Encounter (Signed)
Left message for pt to call office regarding xray and lab results

## 2014-05-16 ENCOUNTER — Encounter: Payer: Self-pay | Admitting: Internal Medicine

## 2014-05-16 ENCOUNTER — Ambulatory Visit (INDEPENDENT_AMBULATORY_CARE_PROVIDER_SITE_OTHER): Payer: Medicare HMO | Admitting: Internal Medicine

## 2014-05-16 ENCOUNTER — Telehealth: Payer: Self-pay | Admitting: *Deleted

## 2014-05-16 VITALS — BP 138/78 | HR 93 | Temp 97.5°F | Resp 12 | Wt 202.0 lb

## 2014-05-16 DIAGNOSIS — E1149 Type 2 diabetes mellitus with other diabetic neurological complication: Secondary | ICD-10-CM

## 2014-05-16 NOTE — Telephone Encounter (Signed)
Called pt about results and to reschedule appt with Gherge. LMOM

## 2014-05-16 NOTE — Patient Instructions (Addendum)
-   Please increase Lantus to Levemir 45 units 2x a day, in am and at bedtime - Increase Humalog mealtime: 40 units with a smaller meal 45 units with a larger meal  - sliding scale of Humalog:  - 150- 165: + 1 unit  - 166- 180: + 2 units  - 181- 195: + 3 units  - 196- 210: + 4 units  - 211- 225: + 5 units  - 226- 240: + 6 units  - >240: + 7 units  Please stop at the lab. Please return in 1 month with your sugar log.

## 2014-05-16 NOTE — Progress Notes (Signed)
Patient ID: Amanda [name], female   DOB: Oct 21, 1940, 73 y.o.   MRN: [id number]  HPI: Amanda [name] is a 73 y.o.-year-old female, returning for f/u for DM2, dx 2008, insulin-dependent since dx, uncontrolled, with complications (DR, PN). Last visit 1 mo ago.   She has diarrhea in the last 2 days after starting Mobic for L foot pain (tested for gout >> but not confirmed).  Last hemoglobin A1c was: Lab Results  Component Value Date   HGBA1C 13.5* 01/31/2014   HGBA1C 12.2* 05/31/2013   HGBA1C 12.8 11/16/2010  - in 10/16/2012, hemoglobin A1c was 6.9% - in 07/2012, hemoglobin A1c was 9.9% - in 11/16/2010, hemoglobin A1c was 12.8% - in 03/2011, hemoglobin A1c was 6.9% - In 07/10/2010 her hemoglobin A1c was 10.5% C-peptide was 6.3 and 01/2011.  Pt was on a regimen of: - NPH-regular (novolin) 70/30 - 50-20-50 units with meals - syringes  - takes this 10-15 min before a meal  She is now on: - Levemir 60 units at bedtime - bottle - Humalog mealtime: - bottle 30 units with a smaller meal - she does not take it when she goes out as she does not want to take the insulin with  her out (she keeps it in the fridge) 35 units with a larger meal  - sliding scale of Humalog:  - 150- 165: + 1 unit  - 166- 180: + 2 units  - 181- 195: + 3 units  - 196- 210: + 4 units  - 211- 225: + 5 units  - 226- 240: + 6 units  - >240: + 7 units   She could not tolerate doses of metformin XR higher than 250 mg twice a day in the past due to diarrhea. She does not want to resume this. Was previously on the Victoza 1.8 mg daily - "made me sick". She was previously on Janumet between 2012-2013. She was on Invokana - but could not afford it while in the Medicare doughnut hole. Per Dr. Darryl [name] notes, patient was missing approximately 4 doses of insulin a week when eating out. Also, per his review of pharmacy records in 07/2012 this showed incomplete adherence to some or all of her medicines, although she claimed full  burins other than missing lunchtime insulin doses half of the time.  Pt checks her sugars 1-3x a day - reviewed  Log - sugars higher: - am: 190-304 >> 210-400 (lowest 140) >> 143-191 >> 136-178 >> 190-255 >> 209-347 - 2h after b'fast: n/c  - before lunch: 220-290 >> 200-300s >> 140, 223, 247 >> 161-319 >> 258-290 >> 167-343 - 2h after lunch: 240-312 >> n/c - before dinner: 140-305 >> 200-300s >> 206, 260 >> 150-330 >> 150-170 >> 90 x1, 315 - bedtime: 152-306 >> n/c >> 161-326 No lows. Lowest sugar was 90 in last mo; she has hypoglycemia awareness at 100. Highest sugar was 300s.  Pt's meals are - reviewed per last visit: - Breakfast: "I am not a breakfast eater": 1 egg + toast; 3 pancakes + sugarfree syrup; waffle; french toast - not skipping anymore  - Lunch: sometimes skips, sometimes eats out: pizza, salad, no dessert - Dinner: at 5:30-6pm: soup, meat + vegetables + starch, dessert - Snacks: 8 pm: fruit, sugar free  She is eating out 4 times a week. At those time, she does not take the mealtime insulin!! She was seen for nutritional advice and diabetes education, with last visit on 04/2012 - Amanda [name].  - no  CKD, last BUN/creatinine:  Lab Results  Component Value Date   BUN 14 03/29/2014   CREATININE 0.6 03/29/2014  She is on ACEI. Last microalbumin to creatinine ratio was 19.2 in 09/2012. - last set of lipids: Lab Results  Component Value Date   CHOL 167 06/01/2013   HDL 21* 06/01/2013   LDLCALC 91 06/01/2013   TRIG 275* 06/01/2013   CHOLHDL 8.0 06/01/2013  She is on Pravastatin. - last eye exam was in 07/2013. 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 At last eye exam (Dr Amanda [name]): cataracts. - no numbness and tingling in her feet. She is on aspirin 81 mg daily.  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  ROS: Constitutional: no weight gain/loss, no fatigue Eyes: no blurry vision, no xerophthalmia ENT: no sore throat, no nodules palpated in throat, no dysphagia/odynophagia, no hoarseness; +Tinnitus Cardiovascular: no CP/SOB/palpitations/leg and hand swelling Respiratory:no  cough/no SOB Gastrointestinal: no N/V/+D/no C Musculoskeletal: no muscle/joint aches Skin: no rash; + pain in L foot Neurological: no tremors/numbness/tingling/dizziness  I reviewed pt's medications, allergies, PMH, social hx, family hx and no changes required, except as mentioned above.  PE: BP 138/78  Pulse 93  Temp(Src) 97.5 F (36.4 C) (Oral)  Resp 12  Wt 202 lb (91.627 kg)  SpO2 96% Wt Readings from Last 3 Encounters:  05/14/14 196 lb (88.905 kg)  04/10/14 194 lb (87.998 kg)  04/08/14 196 lb (88.905 kg)   Constitutional: overweight, in NAD Eyes: PERRLA, EOMI, no exophthalmos ENT: moist mucous membranes, no thyromegaly, no cervical lymphadenopathy Cardiovascular: RRR, No MRG Respiratory: CTA B Gastrointestinal: abdomen soft, NT, ND, BS+ Musculoskeletal: no deformities, strength intact in all 4 Skin: moist, warm, no rashes  ASSESSMENT: 1. 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 Patient with long-standing, uncontrolled diabetes, with medication noncompliance due to finances and forgetting to take the insulin doses. We are very limited in the choices of her diabetic medications by her ability to afford the medicines and also her intolerances.  - I advised her to keep the insulin in use out of the fridge >> it is imperative she takes it with her when she eats out.  - I advised her to increase mealtime Humalog and basal insulin doses:  Patient Instructions  - Please increase Levemir 45 units 2x a day, in am and at bedtime - Increase Humalog mealtime: 40 units with a smaller meal 45 units with a larger meal  -  sliding scale of Humalog:  - 150- 165: + 1 unit  - 166- 180: + 2 units  - 181- 195: + 3 units  - 196- 210: + 4 units  - 211- 225: + 5 units  - 226- 240: + 6 units  - >240: + 7 units  Please stop at the lab. Please return in 1 month with your sugar log.  - advised her to continue checking her sugars at different times of the day - check 3 times a day, rotating checks - she is up to date with eye exams (Dr. Zigmund [name])  - she has PVera >> will check a HbA1c and fructosamine - pt given sick day rule to manage her DM during this period when she has more diarrhea and cannot eat well - Return to clinic in one month with sugar log  Office Visit on 05/16/2014  Component Date Value Ref Range Status  . Hemoglobin A1C 05/16/2014 9.8* 4.6 - 6.5 % Final   Glycemic Control Guidelines for People with Diabetes:Non Diabetic:  <6%Goal of Therapy: <7%Additional Action Suggested:  >8%   . Fructosamine 05/16/2014 373* 190 - 270 umol/L Final   Msg sent: Dear Amanda [name], HbA1c is much better. The actual calculated HbA1c from fructosamine: 7.95%. Sincerely, [name] MD

## 2014-05-16 NOTE — Telephone Encounter (Signed)
Message copied by Gretchen Short on Thu May 16, 2014  3:36 PM ------      Message from: Emi Belfast D      Created: Thu May 16, 2014  3:11 PM       Call pt and let her know that her foot xray is normal and her gout blood test is normal.  I do not think this is gout             She missed her appointment with Dr. Cruzita Lederer  Advise her to reschedule this ------

## 2014-05-17 LAB — HEMOGLOBIN A1C: HEMOGLOBIN A1C: 9.8 % — AB (ref 4.6–6.5)

## 2014-05-20 LAB — FRUCTOSAMINE: Fructosamine: 373 umol/L — ABNORMAL HIGH (ref 190–270)

## 2014-05-23 ENCOUNTER — Other Ambulatory Visit: Payer: Self-pay | Admitting: *Deleted

## 2014-05-23 ENCOUNTER — Telehealth: Payer: Self-pay | Admitting: Internal Medicine

## 2014-05-23 MED ORDER — GLUCOSE BLOOD VI STRP: ORAL_STRIP | Status: DC

## 2014-05-23 NOTE — Telephone Encounter (Signed)
Patient need refill on contour next test strips. S

## 2014-05-23 NOTE — Telephone Encounter (Signed)
Patients husband called and would like to speak with [facility] regarding her contour test strips   Thank you

## 2014-05-23 NOTE — Telephone Encounter (Signed)
Called and spoke with Amanda [name]. Advised him that rx refill for test strips sent to the pharmacy. They cannot afford them. He asked for samples. I advised him we did not have any at this time. I found a Contour discount card and called them back, lvm advising them to come pick up the card to help with the cost.

## 2014-05-27 ENCOUNTER — Other Ambulatory Visit: Payer: Self-pay | Admitting: Internal Medicine

## 2014-05-27 NOTE — Telephone Encounter (Signed)
Requested Medications     Medication name:  Name from pharmacy:  pravastatin (PRAVACHOL) 40 MG tablet  PRAVASTATIN SODIUM 40 MG TAB    Sig: TAKE 1 TABLET (40 MG TOTAL) BY MOUTH DAILY.    Dispense: 90 tablet Refills: 0 Start: 05/27/2014  Class: Normal    Requested on: 03/05/2014    Originally ordered on: 02/07/2014 Last refill: 03/05/2014 Order History and Details

## 2014-05-28 ENCOUNTER — Encounter: Payer: Self-pay | Admitting: Internal Medicine

## 2014-05-28 ENCOUNTER — Ambulatory Visit (INDEPENDENT_AMBULATORY_CARE_PROVIDER_SITE_OTHER): Payer: Medicare HMO | Admitting: Internal Medicine

## 2014-05-28 VITALS — BP 144/72 | HR 86 | Temp 97.8°F | Resp 16 | Ht 64.0 in | Wt 206.0 lb

## 2014-05-28 DIAGNOSIS — M25579 Pain in unspecified ankle and joints of unspecified foot: Secondary | ICD-10-CM

## 2014-05-28 DIAGNOSIS — I10 Essential (primary) hypertension: Secondary | ICD-10-CM

## 2014-05-28 DIAGNOSIS — M25572 Pain in left ankle and joints of left foot: Secondary | ICD-10-CM

## 2014-05-28 DIAGNOSIS — R197 Diarrhea, unspecified: Secondary | ICD-10-CM

## 2014-05-28 NOTE — Progress Notes (Signed)
Subjective:    Patient ID: Amanda [name], female    DOB: 1941/02/06, 73 y.o.   MRN: [id number]  HPI  Amanda [name] is here for 10 day follow up.  She had pain in her great toe and was wondering if this was gout.     Uric acid normal and mild DJD change with spur on heel .  Toe has improved  No redness or swelling   I had placed her on a short course of Mobic    She has finished this and will occasionally take Alleve.   She is stressed today as her grand-son is getting married and they cannot find a Rabbi that will perform ceremony  Diarrhea comes and goes but some improvement with Immodium  See BP  She has not take her meds today  Pt frequently runs out of meds due to financial constraints  Discussed Zostavax today  But pt states she cannnot afford   Allergies  Allergen Reactions  . Tape Hives  . Doxycycline Hives, Swelling and Rash  . Latex Hives, Itching and Rash   Past Medical History  Diagnosis Date  . Hypertension   . Hyperlipidemia   . Diabetes mellitus 2008  . Depression   . Obesity   . Psoriasis   . Polycythemia     Dr. Elease Hashimoto- [facility]  . Herpes simplex   . Gout   . Hyperglycemia 05/31/2013  . C. difficile colitis   . Cirrhosis   . Cancer     SKIN  . Neuromuscular disorder     BELL PALSY   Past Surgical History  Procedure Laterality Date  . Breast surgery Left     milk duct  . Total abdominal hysterectomy w/ bilateral salpingoophorectomy      for heavy periods with appendectomy  . Tonsillectomy    . Colonoscopy     History   Social History  . Marital Status: Married    Spouse Name: N/A    Number of Children: 2  . Years of Education: N/A   Occupational History  . housewife    Social History Main Topics  . Smoking status: Former Smoker -- 48 years    Types: Cigarettes    Quit date: 10/18/1986  . Smokeless tobacco: Never Used  . Alcohol Use: No  . Drug Use: No  . Sexual Activity: Yes    Birth Control/ Protection: None   Other Topics  Concern  . Not on file   Social History Narrative  . No narrative on file   Family History  Problem Relation Age of Onset  . Diabetes Mother   . Heart disease Mother     CAD  . Hyperlipidemia Mother   . Hypertension Mother   . Kidney disease Mother   . Kidney disease Father   . Breast cancer Maternal Aunt   . Heart disease Maternal Grandmother   . Heart disease Other     maternal aunts and uncles   Patient Active Problem List   Diagnosis Date Noted  . Cholelithiasis 02/07/2014  . Clostridium difficile colitis 02/05/2014  . UTI (lower urinary tract infection) 01/29/2014  . Dizziness 01/29/2014  . Diarrhea 01/29/2014  . Dehydration 01/29/2014  . Candidiasis of genitalia in female 11/19/2013  . Cellulitis and abscess of buttock, left 11/01/2013  . Edema 07/09/2013  . Constipation 07/09/2013  . Type II or unspecified type diabetes mellitus with neurological manifestations, uncontrolled 06/07/2013  . Leukocytosis, unspecified 06/02/2013  . Bacteria in urine 06/01/2013  .  Hyperglycemia 05/31/2013  . Gout 05/31/2013  . 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 HTN (hypertension) 05/31/2013  . History of Bell's palsy 05/26/2013  . DERMATITIS, ATOPIC 11/16/2010  . DIZZINESS 11/16/2010  . WEIGHT GAIN 11/16/2010  . HERPETIC WHITLOW 06/30/2010  . HYPERGLYCEMIA 05/07/2010  . DIASTOLIC DYSFUNCTION 02/54/2706  . DYSPNEA 02/17/2010  . ABSCESS, SKIN 12/22/2009  . SKIN RASH 12/22/2009  . HYPERLIPIDEMIA 12/15/2009  . POLYCYTHEMIA 11/24/2009  . Essential hypertension, benign 11/24/2009  . PSORIASIS 11/24/2009  . Depressive disorder, not elsewhere classified 09/13/2009  . ABSCESS, LEG 09/13/2009  . SPRAIN/STRAIN, ANKLE NOS 01/21/2009   Current Outpatient Prescriptions on File Prior to Visit  Medication Sig Dispense Refill  . aspirin EC 81 MG tablet Take 81 mg by mouth daily.      . B-D UF III MINI PEN NEEDLES 31G X 5 MM MISC       . cetaphil (CETAPHIL) lotion Apply topically  daily.  236 mL  0  . dicyclomine (BENTYL) 10 MG capsule Take 10 mg by mouth 4 (four) times daily -  before meals and at bedtime.      . diphenhydrAMINE (BENADRYL) 25 mg capsule Take 25 mg by mouth every 6 (six) hours as needed.      . gabapentin (NEURONTIN) 100 MG capsule Take 2 tablets at bedtime as needed for pain. Can increase to 3 tablets if pain persists.  90 capsule  2  . glucose blood (BAYER CONTOUR NEXT TEST) test strip Use to test blood sugar 3 times daily as instructed. Dx code: 250.62  100 each  11  . HYDROcodone-acetaminophen (NORCO/VICODIN) 5-325 MG per tablet Take 2 tablets by mouth every 4 (four) hours as needed for moderate pain.      . hydrocortisone 2.5 % cream Apply 1 application topically 2 (two) times daily.      . hydroxyurea (HYDREA) 500 MG capsule Take 1 capsule (500 mg total) by mouth 2 (two) times daily.  60 capsule  6  . ibuprofen (ADVIL,MOTRIN) 200 MG tablet Take 200 mg by mouth every 6 (six) hours as needed for moderate pain.      . Insulin Detemir (LEVEMIR) 100 UNIT/ML Pen Inject 60 units under skin at bedtime  10 pen  11  . insulin lispro (HUMALOG) 100 UNIT/ML injection Inject up to 90 units a day as advised  90 mL  3  . lisinopril (PRINIVIL,ZESTRIL) 40 MG tablet Take 1 tablet (40 mg total) by mouth daily.  90 tablet  3  . meloxicam (MOBIC) 7.5 MG tablet Take 1 tablet (7.5 mg total) by mouth daily.  30 tablet  0  . metoprolol (LOPRESSOR) 100 MG tablet Take 100 mg by mouth 2 (two) times daily.      Marland Kitchen nystatin cream (MYCOSTATIN) Apply 1 application topically 2 (two) times daily.      . ondansetron (ZOFRAN) 8 MG tablet Take 8 mg by mouth every 8 (eight) hours as needed for nausea or vomiting.      . pravastatin (PRAVACHOL) 40 MG tablet Take 1 tablet (40 mg total) by mouth daily.  90 tablet  0  . Probiotic Product (PROBIOTIC DAILY PO) Take by mouth daily.      Marland Kitchen saccharomyces boulardii (FLORASTOR) 250 MG capsule Take 1 capsule (250 mg total) by mouth 2 (two) times daily.   60 capsule  1   No current facility-administered medications on file prior to visit.      Review of Systems See HPI    Objective:  Physical Exam Physical Exam  Nursing note and vitals reviewed.  Constitutional: She is oriented to person, place, and time. She appears well-developed and well-nourished.  HENT:  Head: Normocephalic and atraumatic.  Cardiovascular: Normal rate and regular rhythm. Exam reveals no gallop and no friction rub.  No murmur heard.  Pulmonary/Chest: Breath sounds normal. She has no wheezes. She has no rales.  Neurological: She is alert and oriented to person, place, and time.  Skin: Skin is warm and dry.  Psychiatric: She has a normal mood and affect. Her behavior is normal.        Assessment & Plan:  Foot/toe pain/ Heel spur  I do not think this is gout  .  She has mild DJD and peripheral neuropathy.  She is on neurontin now.  Will leave to discretion of endocrinologist to increase Nuerontin  HTN:    Advised ot be sure to take medication daily    DM  She has upcoming appt with Dr. Cruzita Lederer  Cirrhosis:  Work up in pogress per GI  Chronic diarrhea   Some improvement continue Immodium

## 2014-06-18 ENCOUNTER — Ambulatory Visit: Payer: Medicare HMO | Admitting: Gastroenterology

## 2014-07-01 ENCOUNTER — Encounter: Payer: Self-pay | Admitting: Internal Medicine

## 2014-07-01 ENCOUNTER — Ambulatory Visit (INDEPENDENT_AMBULATORY_CARE_PROVIDER_SITE_OTHER): Payer: Medicare HMO | Admitting: Internal Medicine

## 2014-07-01 ENCOUNTER — Other Ambulatory Visit: Payer: Self-pay | Admitting: *Deleted

## 2014-07-01 VITALS — BP 108/64 | HR 88 | Temp 97.9°F | Resp 12 | Wt 206.0 lb

## 2014-07-01 DIAGNOSIS — E1149 Type 2 diabetes mellitus with other diabetic neurological complication: Secondary | ICD-10-CM

## 2014-07-01 MED ORDER — INSULIN REGULAR HUMAN (CONC) 500 UNIT/ML ~~LOC~~ SOLN: SUBCUTANEOUS | Status: DC

## 2014-07-01 NOTE — Patient Instructions (Signed)
Please stop Levemir and Humalog. Start U500 insulin (inject 30 min before a meal): - 15 units (0.15 mL) before breakfast - 18 units (0.18 mL) before dinner Please check sugars ~ 2 am he night after you start the U500 and check sugars more throughout the day in the first 3 days after you start. Please let me know if the sugars are consistently <80 or >200. Please schedule the next eye exam. Please return in 1 month with your sugar log.

## 2014-07-01 NOTE — Telephone Encounter (Signed)
Pt called stating that rx for the Humulin did not go through. Asked [place] to resend. Done.

## 2014-07-01 NOTE — Progress Notes (Signed)
Patient ID: Amanda [name], female   DOB: September 21, 1941, 73 y.o.   MRN: [id number]  HPI: Amanda [name] is a 73 y.o.-year-old female, returning for f/u for DM2, dx 2008, insulin-dependent since dx, uncontrolled, with complications (DR, PN). Last visit 1 mo ago.   She still has L foot pain and swelling (tested for gout >> but not confirmed). She has problems sleeping at night b/c of the pain.  Last hemoglobin A1c was: 05/16/2014: HbA1c from fructosamine: 7.95%. Lab Results  Component Value Date   HGBA1C 9.8* 05/16/2014   HGBA1C 13.5* 01/31/2014   HGBA1C 12.2* 05/31/2013  - in 10/16/2012, hemoglobin A1c was 6.9% - in 07/2012, hemoglobin A1c was 9.9% - in 11/16/2010, hemoglobin A1c was 12.8% - in 03/2011, hemoglobin A1c was 6.9% - In 07/10/2010 her hemoglobin A1c was 10.5% C-peptide was 6.3 and 01/2011.  Pt was on a regimen of: - NPH-regular (novolin) 70/30 - 50-20-50 units with meals - syringes  - takes this 10-15 min before a meal  She is now on: - Levemir 45 units 2x a day, in am and at bedtime - Humalog mealtime: 40 units with a smaller meal 45 units with a larger meal  - Sliding Scale of Humalog:  - 150- 165: + 1 unit  - 166- 180: + 2 units  - 181- 195: + 3 units  - 196- 210: + 4 units  - 211- 225: + 5 units  - 226- 240: + 6 units  - >240: + 7 units  She could not tolerate doses of metformin XR higher than 250 mg twice a day in the past due to diarrhea. She does not want to resume this. Was previously on the Victoza 1.8 mg daily - "made me sick". She was previously on Janumet between 2012-2013. She was on Invokana - but could not afford it while in the Medicare doughnut hole.  Pt checks her sugars 2-3x a day - reviewed  Her log: - am: 190-304 >> 210-400 (lowest 140) >> 143-191 >> 136-178 >> 190-255 >> 209-347 >> same - 2h after b'fast: n/c  - before lunch: 220-290 >> 200-300s >> 140, 223, 247 >> 161-319 >> 258-290 >> 167-343 >> 175-340 - 2h after lunch: 240-312 >> n/c >>  270-456 - before dinner: 140-305 >> 200-300s >> 206, 260 >> 150-330 >> 150-170 >> 90 x1, 315 >> 175-336 - bedtime: 152-306 >> n/c >> 161-326 >> 164, 240, 240, 268 No lows. Lowest sugar was Lo x1; she has hypoglycemia awareness at 100. Highest sugar was 450s  Pt's meals are - reviewed per last visit: - Breakfast: ("I am not a breakfast eater") 1 egg + toast; 3 pancakes + sugarfree syrup; waffle; french toast - not skipping anymore  - Lunch: sometimes skips, sometimes eats out: pizza, salad, no dessert - Dinner: at 5:30-6pm: soup, meat + vegetables + starch, dessert - Snacks: 8 pm: fruit, sugar free  She is eating out 4 times a week. At those time, she does not take the mealtime insulin!! She was seen for nutritional advice and diabetes education, with last visit on 04/2012 - [name].  - no CKD, last BUN/creatinine:  Lab Results  Component Value Date   BUN 14 03/29/2014   CREATININE 0.6 03/29/2014  She is on ACEI. Last microalbumin to creatinine ratio was 19.2 in 09/2012. - last set of lipids: Lab Results  Component Value Date   CHOL 167 06/01/2013   HDL 21* 06/01/2013   LDLCALC 91 06/01/2013   TRIG  275* 06/01/2013   CHOLHDL 8.0 06/01/2013  She is on Pravastatin. - last eye exam was in 07/2013. 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 At last eye exam (Dr Zigmund Daniel): cataracts. - no numbness and tingling in her feet. She is on aspirin 81 mg daily.  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  ROS: Constitutional: no weight gain/loss, no fatigue Eyes: no blurry vision, no xerophthalmia ENT: no sore throat, no nodules palpated in throat, no dysphagia/odynophagia, no hoarseness; +Tinnitus Cardiovascular: no CP/SOB/palpitations/leg and hand swelling Respiratory:no  cough/no SOB Gastrointestinal: no  N/V/+D/no C Musculoskeletal: no muscle/joint aches Skin: no rash; + pain in L foot Neurological: no tremors/numbness/tingling/dizziness  I reviewed pt's medications, allergies, PMH, social hx, family hx and no changes required, except as mentioned above.  PE: BP 108/64  Pulse 88  Temp(Src) 97.9 F (36.6 C) (Oral)  Resp 12  Wt 206 lb (93.441 kg)  SpO2 95% Wt Readings from Last 3 Encounters:  07/01/14 206 lb (93.441 kg)  05/28/14 206 lb (93.441 kg)  05/16/14 202 lb (91.627 kg)   Constitutional: overweight, in NAD Eyes: PERRLA, EOMI, no exophthalmos ENT: moist mucous membranes, no thyromegaly, no cervical lymphadenopathy Cardiovascular: RRR, No MRG Respiratory: CTA B Gastrointestinal: abdomen soft, NT, ND, BS+ Musculoskeletal: no deformities, strength intact in all 4, L foot swollen - pitting - erythema medial aspect L big toe Skin: moist, warm, no rashes  ASSESSMENT: 1. 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 Patient with long-standing, uncontrolled diabetes, with medication noncompliance due to finances and forgetting to take the insulin doses. We are very limited in the choices of her diabetic medications by her ability to afford the medicines and also her intolerances. She is also on many injections a day, a regimen that overwhelms her. Her sugars are mostly in the 200-300s. At this point, I suggested to switch to U500 insulin. Demonstrated syringe use. She and husband voiced understanding. We will start with BID dosing. - I advised her to:  Patient Instructions  Please stop Levemir and Humalog. Start U500 insulin (inject 30 min before a meal): - 15 units - on the syringe (0.15 mL) before breakfast - 18 units - on the syringe (0.18 mL) before dinner Please check sugars ~ 2 am he night after you start the U500 and check sugars more throughout the day in the first 3 days after you start. Please let me know  if the sugars are consistently <80 or >200. Please schedule the next eye exam. Please return in 1 month with your sugar log.  - advised her to continue checking her sugars at different times of the day - check 3 times a day, rotating checks - she is up to date with eye exams (Dr. Zigmund Daniel)  - she has PVera >> need to check fructosamine in the future - Return to clinic in one month with sugar log

## 2014-07-04 ENCOUNTER — Ambulatory Visit (HOSPITAL_BASED_OUTPATIENT_CLINIC_OR_DEPARTMENT_OTHER): Payer: Medicare HMO | Admitting: Hematology and Oncology

## 2014-07-04 ENCOUNTER — Encounter: Payer: Self-pay | Admitting: Hematology and Oncology

## 2014-07-04 ENCOUNTER — Other Ambulatory Visit (HOSPITAL_BASED_OUTPATIENT_CLINIC_OR_DEPARTMENT_OTHER): Payer: Medicare HMO

## 2014-07-04 ENCOUNTER — Telehealth: Payer: Self-pay | Admitting: Internal Medicine

## 2014-07-04 ENCOUNTER — Other Ambulatory Visit: Payer: Self-pay | Admitting: *Deleted

## 2014-07-04 ENCOUNTER — Telehealth: Payer: Self-pay | Admitting: Hematology and Oncology

## 2014-07-04 VITALS — BP 178/64 | HR 81 | Temp 97.8°F | Resp 21 | Ht 64.0 in | Wt 202.8 lb

## 2014-07-04 DIAGNOSIS — E1149 Type 2 diabetes mellitus with other diabetic neurological complication: Secondary | ICD-10-CM

## 2014-07-04 DIAGNOSIS — D72829 Elevated white blood cell count, unspecified: Secondary | ICD-10-CM

## 2014-07-04 DIAGNOSIS — I10 Essential (primary) hypertension: Secondary | ICD-10-CM

## 2014-07-04 DIAGNOSIS — D473 Essential (hemorrhagic) thrombocythemia: Secondary | ICD-10-CM

## 2014-07-04 DIAGNOSIS — D45 Polycythemia vera: Secondary | ICD-10-CM

## 2014-07-04 DIAGNOSIS — Z299 Encounter for prophylactic measures, unspecified: Secondary | ICD-10-CM | POA: Insufficient documentation

## 2014-07-04 DIAGNOSIS — Z23 Encounter for immunization: Secondary | ICD-10-CM

## 2014-07-04 DIAGNOSIS — D75839 Thrombocytosis, unspecified: Secondary | ICD-10-CM | POA: Insufficient documentation

## 2014-07-04 LAB — MANUAL DIFFERENTIAL
ALC: 2.4 10*3/uL (ref 0.9–3.3)
ANC (CHCC manual diff): 12.4 10*3/uL — ABNORMAL HIGH (ref 1.5–6.5)
BAND NEUTROPHILS: 0 % (ref 0–10)
BASOPHIL: 0 % (ref 0–2)
Blasts: 0 % (ref 0–0)
EOS%: 0 % (ref 0–7)
LYMPH: 16 % (ref 14–49)
METAMYELOCYTES PCT: 0 % (ref 0–0)
MONO: 3 % (ref 0–14)
Myelocytes: 0 % (ref 0–0)
NRBC: 0 % (ref 0–0)
Other Cell: 0 % (ref 0–0)
PLT EST: INCREASED
PROMYELO: 0 % (ref 0–0)
RBC COMMENTS: NORMAL
SEG: 81 % — ABNORMAL HIGH (ref 38–77)
Variant Lymph: 0 % (ref 0–0)

## 2014-07-04 LAB — CBC WITH DIFFERENTIAL/PLATELET
HCT: 45.2 % (ref 34.8–46.6)
HEMOGLOBIN: 15 g/dL (ref 11.6–15.9)
MCH: 32.4 pg (ref 25.1–34.0)
MCHC: 33.1 g/dL (ref 31.5–36.0)
MCV: 97.9 fL (ref 79.5–101.0)
Platelets: 580 10*3/uL — ABNORMAL HIGH (ref 145–400)
RBC: 4.62 10*6/uL (ref 3.70–5.45)
RDW: 14 % (ref 11.2–14.5)
WBC: 15.3 10*3/uL — ABNORMAL HIGH (ref 3.9–10.3)

## 2014-07-04 MED ORDER — INFLUENZA VAC SPLIT QUAD 0.5 ML IM SUSY
0.5000 mL | PREFILLED_SYRINGE | Freq: Once | INTRAMUSCULAR | Status: AC
  Administered 2014-07-04: 0.5 mL via INTRAMUSCULAR
  Filled 2014-07-04: qty 0.5

## 2014-07-04 MED ORDER — INSULIN REGULAR HUMAN (CONC) 500 UNIT/ML ~~LOC~~ SOLN: SUBCUTANEOUS | Status: DC

## 2014-07-04 NOTE — Assessment & Plan Note (Addendum)
She does not need phlebotomy today. However, her total white blood cell count and platelet counts are still high. The patient has missed several doses off the evening dose because she takes hydroxyurea at separate times. I recommend she takes hydroxyurea together. I would like to add an additional 2 tablets over the weekend meaning, she will take 3 tablets on Saturdays and Sundays, and see her back in 3 months for repeat blood work and assessment.

## 2014-07-04 NOTE — Assessment & Plan Note (Signed)
She has poorly controlled diabetes. I would defer to her primary care provider for management. This is not related to her underlying myeloproliferative disorder.

## 2014-07-04 NOTE — Assessment & Plan Note (Signed)
Her blood pressure is high but she is not symptomatic. Uncontrolled myeloproliferative disorder can cause elevated blood pressure. Hopefully, with additional doses of hydroxyurea, this will get better.

## 2014-07-04 NOTE — Assessment & Plan Note (Signed)
We discussed the importance of preventive care and reviewed the vaccination programs. She does not have any prior allergic reactions to influenza vaccination. She agrees to proceed with influenza vaccination today and we will administer it today at the clinic.  

## 2014-07-04 NOTE — Assessment & Plan Note (Signed)
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 I will increase her hydroxyurea dose as above.

## 2014-07-04 NOTE — Telephone Encounter (Signed)
Pt confirmed labs/ov per 09/17 POF, gave pt AVS..Marland KitchenKJ

## 2014-07-04 NOTE — Telephone Encounter (Signed)
Please read note below and advise.  

## 2014-07-04 NOTE — Assessment & Plan Note (Signed)
This is related to the spectrum of her myeloproliferative disorder. I am increasing the dose of hydroxyurea as above.

## 2014-07-04 NOTE — Progress Notes (Signed)
[facility] OFFICE PROGRESS NOTE  Patient Care Team: Lanice Shirts, MD as PCP - General (Internal Medicine) Limmie Patricia, MD as Attending Physician (Endocrinology) [name], MD as Consulting Physician (Hematology and Oncology) [name], MD as Consulting Physician (Internal Medicine)  SUMMARY OF ONCOLOGIC HISTORY: [name] who was found to have erythrocytosis requiring phlebotomy and tested negative for that JAK 2 to mutation. The patient had bone marrow aspirate and biopsy performed in [place] and confirmed myeloproliferative disorder. We do not have records of those results. She had been treated with phlebotomy, hydroxyurea and aspirin.  The patient also have liver cirrhosis with splenomegaly, on observation On 07/04/2014, hydroxyurea was increased to 2 tablets daily except on Saturdays and Sundays she take 3 tablets INTERVAL HISTORY: Please see below for problem oriented charting. She denies recent diagnosis of blood clots. She complained of severe peripheral neuropathy with poorly controlled diabetes. She was recently started on a diuretic therapy for fluid retention. REVIEW OF SYSTEMS:   Constitutional: Denies fevers, chills or abnormal weight loss Eyes: Denies blurriness of vision Ears, nose, mouth, throat, and face: Denies mucositis or sore throat Respiratory: Denies cough, dyspnea or wheezes Cardiovascular: Denies palpitation, chest discomfort  Gastrointestinal:  Denies nausea, heartburn or change in bowel habits Skin: Denies abnormal skin rashes Lymphatics: Denies new lymphadenopathy or easy bruising Neurological:Denies numbness, tingling or new weaknesses Behavioral/Psych: Mood is stable, no new changes  All other systems were reviewed with the patient and are negative.  I have reviewed the past medical history, past surgical history, social history and family history with the patient and they are unchanged from previous  note.  ALLERGIES:  is allergic to tape; doxycycline; and latex.  MEDICATIONS:  Current Outpatient Prescriptions  Medication Sig Dispense Refill  . aspirin EC 81 MG tablet Take 81 mg by mouth daily.      . B-D UF III MINI PEN NEEDLES 31G X 5 MM MISC       . cetaphil (CETAPHIL) lotion Apply topically daily.  236 mL  0  . dicyclomine (BENTYL) 10 MG capsule Take 10 mg by mouth 4 (four) times daily -  before meals and at bedtime.      . diphenhydrAMINE (BENADRYL) 25 mg capsule Take 25 mg by mouth every 6 (six) hours as needed.      . gabapentin (NEURONTIN) 100 MG capsule Take 2 tablets at bedtime as needed for pain. Can increase to 3 tablets if pain persists.  90 capsule  2  . glucose blood (BAYER CONTOUR NEXT TEST) test strip Use to test blood sugar 3 times daily as instructed. Dx code: 250.62  100 each  11  . HYDROcodone-acetaminophen (NORCO/VICODIN) 5-325 MG per tablet Take 2 tablets by mouth every 4 (four) hours as needed for moderate pain.      . hydrocortisone 2.5 % cream Apply 1 application topically 2 (two) times daily.      . hydroxyurea (HYDREA) 500 MG capsule Take 1 capsule (500 mg total) by mouth 2 (two) times daily.  60 capsule  6  . ibuprofen (ADVIL,MOTRIN) 200 MG tablet Take 200 mg by mouth every 6 (six) hours as needed for moderate pain.      Marland Kitchen insulin regular human CONCENTRATED (HUMULIN R) 500 UNIT/ML SOLN injection Inject under skin 33 units as advised  20 mL  2  . lisinopril (PRINIVIL,ZESTRIL) 40 MG tablet Take 1 tablet (40 mg total) by mouth daily.  90 tablet  3  .  meloxicam (MOBIC) 7.5 MG tablet Take 1 tablet (7.5 mg total) by mouth daily.  30 tablet  0  . metoprolol (LOPRESSOR) 100 MG tablet Take 100 mg by mouth 2 (two) times daily.      Marland Kitchen nystatin cream (MYCOSTATIN) Apply 1 application topically 2 (two) times daily.      . ondansetron (ZOFRAN) 8 MG tablet Take 8 mg by mouth every 8 (eight) hours as needed for nausea or vomiting.      . pravastatin (PRAVACHOL) 40 MG tablet  Take 1 tablet (40 mg total) by mouth daily.  90 tablet  0  . Probiotic Product (PROBIOTIC DAILY PO) Take by mouth daily.      Marland Kitchen saccharomyces boulardii (FLORASTOR) 250 MG capsule Take 1 capsule (250 mg total) by mouth 2 (two) times daily.  60 capsule  1   Current Facility-Administered Medications  Medication Dose Route Frequency Provider Last Rate Last Dose  . Influenza vac split quadrivalent PF (FLUARIX) injection 0.5 mL  0.5 mL Intramuscular Once [name], MD        PHYSICAL EXAMINATION: ECOG PERFORMANCE STATUS: 1 - Symptomatic but completely ambulatory  Filed Vitals:   07/04/14 0932  BP: 178/64  Pulse: 81  Temp: 97.8 F (36.6 C)  Resp: 21   Filed Weights   07/04/14 0932  Weight: 202 lb 12.8 oz (91.989 kg)    GENERAL:alert, no distress and comfortable. She is morbidly obese SKIN: skin color, texture, turgor are normal, no rashes or significant lesions EYES: normal, Conjunctiva are pink and non-injected, sclera clear OROPHARYNX:no exudate, no erythema and lips, buccal mucosa, and tongue normal  NECK: supple, thyroid normal size, non-tender, without nodularity LYMPH:  no palpable lymphadenopathy in the cervical, axillary or inguinal LUNGS: Normal breathing effort, crackles audible at the lung bases HEART: regular rate & rhythm and no murmurs with moderate bilateral lower extremity edema ABDOMEN:abdomen soft, non-tender and normal bowel sounds Musculoskeletal:no cyanosis of digits and no clubbing  NEURO: alert & oriented x 3 with fluent speech, no focal motor/sensory deficits  LABORATORY DATA:  I have reviewed the data as listed    Component Value Date/Time   NA 134* 03/29/2014 1435   NA 133* 12/10/2013 1239   K 3.9 03/29/2014 1435   K 4.0 12/10/2013 1239   CL 100 03/29/2014 1435   CL 103 02/16/2013 0900   CO2 25 03/29/2014 1435   CO2 21* 12/10/2013 1239   GLUCOSE 403* 03/29/2014 1435   GLUCOSE 635* 12/10/2013 1239   GLUCOSE 396* 02/16/2013 0900   GLUCOSE 292 06/02/2010   BUN  14 03/29/2014 1435   BUN 7.9 12/10/2013 1239   CREATININE 0.6 03/29/2014 1435   CREATININE 1.2* 12/10/2013 1239   CALCIUM 9.7 03/29/2014 1435   CALCIUM 9.5 12/10/2013 1239   PROT 7.1 03/29/2014 1435   PROT 6.7 12/10/2013 1239   ALBUMIN 4.0 03/29/2014 1435   ALBUMIN 3.7 12/10/2013 1239   AST 30 03/29/2014 1435   AST 27 12/10/2013 1239   ALT 19 03/29/2014 1435   ALT 18 12/10/2013 1239   ALKPHOS 91 03/29/2014 1435   ALKPHOS 108 12/10/2013 1239   BILITOT 0.7 03/29/2014 1435   BILITOT 0.83 12/10/2013 1239   GFRNONAA >90 02/01/2014 0911   GFRAA >90 02/01/2014 0911    No results found for this basename: SPEP,  UPEP,   kappa and lambda light chains    Lab Results  Component Value Date   WBC 15.3* 07/04/2014   NEUTROABS 10.5* 03/29/2014   HGB  15.0 07/04/2014   HCT 45.2 07/04/2014   MCV 97.9 07/04/2014   PLT 580* 07/04/2014      Chemistry      Component Value Date/Time   NA 134* 03/29/2014 1435   NA 133* 12/10/2013 1239   K 3.9 03/29/2014 1435   K 4.0 12/10/2013 1239   CL 100 03/29/2014 1435   CL 103 02/16/2013 0900   CO2 25 03/29/2014 1435   CO2 21* 12/10/2013 1239   BUN 14 03/29/2014 1435   BUN 7.9 12/10/2013 1239   CREATININE 0.6 03/29/2014 1435   CREATININE 1.2* 12/10/2013 1239      Component Value Date/Time   CALCIUM 9.7 03/29/2014 1435   CALCIUM 9.5 12/10/2013 1239   ALKPHOS 91 03/29/2014 1435   ALKPHOS 108 12/10/2013 1239   AST 30 03/29/2014 1435   AST 27 12/10/2013 1239   ALT 19 03/29/2014 1435   ALT 18 12/10/2013 1239   BILITOT 0.7 03/29/2014 1435   BILITOT 0.83 12/10/2013 1239      ASSESSMENT & PLAN:  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 However, her total white blood cell count and platelet counts are still high. The patient has missed several doses off the evening dose because she takes hydroxyurea at separate times. I recommend she takes hydroxyurea together. I would like to add an additional 2 tablets over the weekend meaning, she will take 3 tablets on Saturdays and  Sundays, and see her back in 3 months for repeat blood work and assessment.  Leukocytosis, unspecified This is related to the spectrum of her myeloproliferative disorder. I am increasing the dose of hydroxyurea as above.  Thrombocytosis This is related to myeloproliferative disorder. I will increase her hydroxyurea dose as above.  Type II or unspecified type diabetes mellitus with neurological manifestations, uncontrolled She has poorly controlled diabetes. I would defer to her primary care provider for management. This is not related to her underlying myeloproliferative disorder.  HTN (hypertension) Her blood pressure is high but she is not symptomatic. Uncontrolled myeloproliferative disorder can cause elevated blood pressure. Hopefully, with additional doses of hydroxyurea, this will get better.  Preventive measure We discussed the importance of preventive care and reviewed the vaccination programs. She does not have any prior allergic reactions to influenza vaccination. She agrees to proceed with influenza vaccination today and we will administer it today at the clinic.     Orders Placed This Encounter  Procedures  . CBC with Differential    Standing Status: Future     Number of Occurrences:      Standing Expiration Date: 08/08/2015   All questions were answered. The patient knows to call the clinic with any problems, questions or concerns. No barriers to learning was detected. I spent 30 minutes counseling the patient face to face. The total time spent in the appointment was 40 minutes and more than 50% was on counseling and review of test results     [facility], [place], MD 07/04/2014 10:09 AM

## 2014-07-04 NOTE — Telephone Encounter (Signed)
Let's print the Rx and I will sign today

## 2014-07-04 NOTE — Telephone Encounter (Signed)
Pt needs rx for insulin to fax to [place] for patient assistance program.

## 2014-07-04 NOTE — Telephone Encounter (Signed)
Husband calling for humalog rx to be faxed to him at # [phone],

## 2014-07-06 ENCOUNTER — Other Ambulatory Visit: Payer: Self-pay | Admitting: Internal Medicine

## 2014-07-08 NOTE — Telephone Encounter (Signed)
Refill request

## 2014-07-11 ENCOUNTER — Telehealth: Payer: Self-pay | Admitting: Internal Medicine

## 2014-07-11 NOTE — Telephone Encounter (Signed)
Please call Mr Ridings  Thank you

## 2014-07-11 NOTE — Telephone Encounter (Signed)
Returned pt's call. He advised that Mrs Nyland had been accepted into the patient assistance program. I advised I spoke with the pharmacist there yesterday and answered the questions. He was pleased.

## 2014-07-22 ENCOUNTER — Telehealth: Payer: Self-pay | Admitting: *Deleted

## 2014-07-22 NOTE — Telephone Encounter (Signed)
Please increase the U500 insulin doses as follows: - from 15 to 18 units - on the syringe (0.18 mL) before breakfast  - from 18 to 20 units - on the syringe (0.20 mL) before dinner She can keep increasing by 2 units (0.02 mL) until sugars <150.

## 2014-07-22 NOTE — Telephone Encounter (Signed)
Pt called stating her blood sugars have stayed high. Wed - Fri at 2 am have been 385, 21 and 378. They run between 200 - 400. Pt said on Sat at 2 pm it was 470 and at 5 pm it was 432. It was 349 at 12 pm on Sunday. It was over 300 this morning. Please advise.

## 2014-07-22 NOTE — Telephone Encounter (Signed)
Called pt and advised her per Dr Arman Filter note. Pt understood. Pt wanted to verify her f/up appt. Advised pt it's 10/23 at 10 am. Pt to let us know how her sugar levels are doing.

## 2014-08-09 ENCOUNTER — Encounter: Payer: Self-pay | Admitting: Internal Medicine

## 2014-08-09 ENCOUNTER — Ambulatory Visit (INDEPENDENT_AMBULATORY_CARE_PROVIDER_SITE_OTHER): Payer: Medicare HMO | Admitting: Internal Medicine

## 2014-08-09 VITALS — BP 130/82 | HR 90 | Temp 97.8°F | Resp 12 | Wt 210.0 lb

## 2014-08-09 DIAGNOSIS — E1165 Type 2 diabetes mellitus with hyperglycemia: Principal | ICD-10-CM

## 2014-08-09 DIAGNOSIS — E1149 Type 2 diabetes mellitus with other diabetic neurological complication: Secondary | ICD-10-CM

## 2014-08-09 DIAGNOSIS — IMO0002 Reserved for concepts with insufficient information to code with codable children: Secondary | ICD-10-CM

## 2014-08-09 DIAGNOSIS — E1141 Type 2 diabetes mellitus with diabetic mononeuropathy: Secondary | ICD-10-CM

## 2014-08-09 MED ORDER — INSULIN REGULAR HUMAN (CONC) 500 UNIT/ML ~~LOC~~ SOLN: SUBCUTANEOUS | Status: DC

## 2014-08-09 NOTE — Patient Instructions (Signed)
Please continue: U500 insulin (inject 30 min before a meal): - 20 units - on the syringe (0.20 mL) before breakfast and before dinner Please let me know if the sugars are consistently <80 or >200. Please schedule the next eye exam. Please return in 1.5 month with your sugar log.

## 2014-08-09 NOTE — Progress Notes (Signed)
Patient ID: Amanda [name], female   DOB: 1941/04/16, 73 y.o.   MRN: [id number]  HPI: Amanda [name] is a 73 y.o.-year-old female, returning for f/u for DM2, dx 2008, insulin-dependent since dx, uncontrolled, with complications (DR, PN). Last visit 1 mo ago.   Last hemoglobin A1c was: 05/16/2014: HbA1c from fructosamine: 7.95%. Lab Results  Component Value Date   HGBA1C 9.8* 05/16/2014   HGBA1C 13.5* 01/31/2014   HGBA1C 12.2* 05/31/2013  - in 10/16/2012, hemoglobin A1c was 6.9% - in 07/2012, hemoglobin A1c was 9.9% - in 11/16/2010, hemoglobin A1c was 12.8% - in 03/2011, hemoglobin A1c was 6.9% - In 07/10/2010 her hemoglobin A1c was 10.5% C-peptide was 6.3 and 01/2011.  Pt was on a regimen of: - Levemir 45 units 2x a day, in am and at bedtime - Humalog mealtime: 40 units with a smaller meal 45 units with a larger meal  - Sliding Scale of Humalog:  - 150- 165: + 1 unit  - 166- 180: + 2 units  - 181- 195: + 3 units  - 196- 210: + 4 units  - 211- 225: + 5 units  - 226- 240: + 6 units  - >240: + 7 units  She could not tolerate doses of metformin XR higher than 250 mg twice a day in the past due to diarrhea. She does not want to resume this. Was previously on the Victoza 1.8 mg daily - "made me sick". She was previously on Janumet between 2012-2013. She was on Invokana - but could not afford it while in the Medicare doughnut hole.  At last visti, we switched to U500: - 15 units - on the syringe (0.15 mL) before breakfast >> 20 units - 18 units - on the syringe (0.18 mL) before dinner >> 20 units  Pt checks her sugars 2-3x a day - reviewed her log: - am: 190-304 >> 210-400 (lowest 140) >> 143-191 >> 136-178 >> 190-255 >> 209-347 >> same >> 90-211, 235 (had a 265 when she did not take the insulin w/ dinner) - 2h after b'fast: n/c >> 238 - before lunch: 220-290 >> 200-300s >> 140, 223, 247 >> 161-319 >> 258-290 >> 167-343 >> 175-340 >> 238 - 2h after lunch: 240-312 >> n/c >> 270-456 >>  112-218 - before dinner: 140-305 >> 200-300s >> 206, 260 >> 150-330 >> 150-170 >> 90 x1, 315 >> 175-336 >> 147, 273 - bedtime: 152-306 >> n/c >> 161-326 >> 164, 240, 240, 268 >> 273 No lows. Lowest sugar was 90x1; she has hypoglycemia awareness at 100. Highest sugar was 450s >> 280s.  Pt's meals are - reviewed per last visit: - Breakfast: ("I am not a breakfast eater") 1 egg + toast; 3 pancakes + sugarfree syrup; waffle; french toast - not skipping anymore  - Lunch: sometimes skips, sometimes eats out: pizza, salad, no dessert - Dinner: at 5:30-6pm: soup, meat + vegetables + starch, dessert - Snacks: 8 pm: fruit, sugar free  She was seen for nutritional advice and diabetes education, with last visit on 04/2012 - Amanda [name].  - no CKD, last BUN/creatinine:  Lab Results  Component Value Date   BUN 14 03/29/2014   CREATININE 0.6 03/29/2014  She is on ACEI. Last microalbumin to creatinine ratio was 19.2 in 09/2012. - last set of lipids: Lab Results  Component Value Date   CHOL 167 06/01/2013   HDL 21* 06/01/2013   LDLCALC 91 06/01/2013   TRIG 275* 06/01/2013   CHOLHDL 8.0 06/01/2013  She is on Pravastatin. - last eye exam was in 07/2013. 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 At last eye exam (Dr Zigmund Daniel): cataracts. - no numbness and tingling in her feet. She is on aspirin 81 mg daily.  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  ROS: Constitutional: no weight gain/loss, no fatigue Eyes: no blurry vision, no xerophthalmia ENT: no sore throat, no nodules palpated in throat, no dysphagia/odynophagia, no hoarseness Cardiovascular: no CP/SOB/palpitations/leg and hand swelling Respiratory:no  cough/no SOB Gastrointestinal: no N/V/+D/no C Musculoskeletal: no muscle/joint aches Skin: + rash under  breasts Neurological: no tremors/numbness/tingling/dizziness, + HA  I reviewed pt's medications, allergies, PMH, social hx, family hx and no changes required, except as mentioned above.  PE: BP 130/82  Pulse 90  Temp(Src) 97.8 F (36.6 C) (Oral)  Resp 12  Wt 210 lb (95.255 kg)  SpO2 95% Wt Readings from Last 3 Encounters:  08/09/14 210 lb (95.255 kg)  07/04/14 202 lb 12.8 oz (91.989 kg)  07/01/14 206 lb (93.441 kg)   Constitutional: overweight, in NAD Eyes: PERRLA, EOMI, no exophthalmos ENT: moist mucous membranes, no thyromegaly, no cervical lymphadenopathy Cardiovascular: RRR, No MRG Respiratory: CTA B Gastrointestinal: abdomen soft, NT, ND, BS+ Musculoskeletal: no deformities, strength intact in all 4 Skin: moist, warm, + rash under breasts  ASSESSMENT: 1. 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 Patient with long-standing, uncontrolled diabetes, with medication noncompliance due to finances and forgetting to take the insulin doses. She is now doing much better on U500 >> sugars decreased by >50%.  - I advised her to continue current doses:  Patient Instructions  Please continue: U500 insulin (inject 30 min before a meal): - 20 units - on the syringe (0.20 mL) before breakfast and before dinner Please let me know if the sugars are consistently <80 or >200. Please schedule the next eye exam. Please return in 1 month with your sugar log.  - advised her to continue checking her sugars at different times of the day - check 3 times a day, rotating checks - needs a new eye exam (Dr. Zigmund Daniel)  >> advised to schedule - she has PVera >> need to check fructosamine instead of Hba1c - Return to clinic in 1.5 month with sugar log  - need fructosamine then

## 2014-08-19 ENCOUNTER — Other Ambulatory Visit: Payer: Medicare HMO

## 2014-08-19 ENCOUNTER — Ambulatory Visit (INDEPENDENT_AMBULATORY_CARE_PROVIDER_SITE_OTHER): Payer: Medicare HMO | Admitting: Gastroenterology

## 2014-08-19 ENCOUNTER — Encounter: Payer: Self-pay | Admitting: Gastroenterology

## 2014-08-19 VITALS — BP 166/82 | HR 84 | Ht 63.0 in | Wt 213.2 lb

## 2014-08-19 DIAGNOSIS — K7460 Unspecified cirrhosis of liver: Secondary | ICD-10-CM

## 2014-08-19 NOTE — Patient Instructions (Addendum)
Try taking imodium, one pill every morning. Call in 4-5 weeks to report on your response. May need colonoscopy still bothered by diarrhea. You will be set up for an ultrasound for hepatoma screening. You have been scheduled for an abdominal ultrasound at [facility] (1st floor of hospital) on 08/23/14 at 830 am. Please arrive 15 minutes prior to your appointment for registration. Make certain not to have anything to eat or drink 6 hours prior to your appointment. Should you need to reschedule your appointment, please contact radiology at [phone]. This test typically takes about 30 minutes to perform. AFP for hepatoma screening. Please return to see Dr. [name] in 6 months sooner if needed. It is important that you have a relatively low salt diet.  High salt diet can cause fluid to accumulate in your legs, abdomen and even around your lungs. You should try to avoid NSAID type over the counter pain medicines as best as possible. Tylenol is safe to take for 'routine' aches and pains, but never take more than 1/2 the dose suggested on the package instructions (never more than 2 grams per day). Avoid alcohol.

## 2014-08-19 NOTE — Progress Notes (Signed)
Review of pertinent gastrointestinal problems: 1. Cirrhosis; discovered by imaging 01/2014 (labs 01/2014 normal plts, normal INR, normal bili, Alb slightly low);  Blood work June 2015 showed that she is not immune to hepatitis A or B, hepatitis C antibody was negative, ANA was negative, AMA was negative, iron studies were normal.  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 C. Diff colitis: diarrhea following abx for buttocks abscess 2015; C. Diff PCR +, C. Diff toxin A/B positive; treated with flagyl, vanc 3. Colon polyps, 04/2011 colonoscopy Dr. Oletta Lamas, TAs by pathology  HPI: This is a    Very pleasant 73 year old woman whom I last saw about 4 months ago  Still has diarrhea,  This has been a problem since her confirmed C. Difficile infection back in April of this year. She does not have diarrhea every day. In approximately happens 2-3 times per week. It is not nearly as severe as when she had her C. Difficile infection. She takes when necessary Imodium and it seems to help.  No other problems.   Past Medical History  Diagnosis Date  . Hypertension   . Hyperlipidemia   . Diabetes mellitus 2008  . Depression   . Obesity   . Psoriasis   . Polycythemia     Dr. Elease Hashimoto- [facility]  . Herpes simplex   . Gout   . Hyperglycemia 05/31/2013  . C. difficile colitis   . Cirrhosis   . Cancer     SKIN  . Neuromuscular disorder     BELL PALSY    Past Surgical History  Procedure Laterality Date  . Breast surgery Left     milk duct  . Total abdominal hysterectomy w/ bilateral salpingoophorectomy      for heavy periods with appendectomy  . Tonsillectomy    . Colonoscopy      Current Outpatient Prescriptions   Medication Sig Dispense Refill  . aspirin EC 81 MG tablet Take 81 mg by mouth daily.    . B-D INS SYR ULTRAFINE 1CC/31G 31G X 5/16" 1 ML MISC     . cetaphil (CETAPHIL) lotion Apply topically daily. 236 mL 0  . dicyclomine (BENTYL) 10 MG capsule Take 10 mg by mouth 4 (four) times daily -  before meals and at bedtime.    . diphenhydrAMINE (BENADRYL) 25 mg capsule Take 25 mg by mouth every 6 (six) hours as needed.    . gabapentin (NEURONTIN) 100 MG capsule Take 2 tablets at bedtime as needed for pain. Can increase to 3 tablets if pain persists. 90 capsule 2  . glucose blood (BAYER CONTOUR NEXT TEST) test strip Use to test blood sugar 3 times daily as instructed. Dx code: 250.62 100 each 11  . HYDROcodone-acetaminophen (NORCO/VICODIN) 5-325 MG per tablet Take 2 tablets by mouth every 4 (four) hours as needed for moderate pain.    . hydrocortisone 2.5 % cream Apply 1 application topically 2 (two) times daily.    . hydroxyurea (HYDREA) 500 MG capsule Take 1 capsule (500 mg total) by mouth 2 (two) times daily. 60 capsule 6  . ibuprofen (ADVIL,MOTRIN) 200 MG tablet Take 200 mg by mouth every 6 (six) hours as needed for moderate pain.    Marland Kitchen insulin regular human CONCENTRATED (HUMULIN R) 500 UNIT/ML  SOLN injection Inject under skin 100 units before breakfast and dinner 20 mL 2  . lisinopril (PRINIVIL,ZESTRIL) 40 MG tablet Take 1 tablet (40 mg total) by mouth daily. 90 tablet 3  . meloxicam (MOBIC) 7.5 MG tablet Take 1 tablet (7.5 mg total) by mouth daily. 30 tablet 0  . metoprolol (LOPRESSOR) 100 MG tablet Take 100 mg by mouth 2 (two) times daily.    Marland Kitchen nystatin cream (MYCOSTATIN) Apply 1 application topically 2 (two) times daily.    . ondansetron (ZOFRAN) 8 MG tablet Take 8 mg by mouth every 8 (eight) hours as needed for nausea or vomiting.    . pravastatin (PRAVACHOL) 40 MG tablet TAKE 1 TABLET (40 MG TOTAL) BY MOUTH DAILY. 90 tablet 0  . Probiotic Product (PROBIOTIC DAILY PO) Take by mouth daily.    Marland Kitchen  saccharomyces boulardii (FLORASTOR) 250 MG capsule Take 1 capsule (250 mg total) by mouth 2 (two) times daily. 60 capsule 1   No current facility-administered medications for this visit.    Allergies as of 08/19/2014 - Review Complete 08/19/2014  Allergen Reaction Noted  . Tape Hives 01/28/2014  . Doxycycline Hives, Swelling, and Rash   . Latex Hives, Itching, and Rash 09/07/2011    Family History  Problem Relation Age of Onset  . Diabetes Mother   . Heart disease Mother     CAD  . Hyperlipidemia Mother   . Hypertension Mother   . Kidney disease Mother   . Kidney disease Father   . Breast cancer Maternal Aunt   . Heart disease Maternal Grandmother   . Heart disease Other     maternal aunts and uncles    History   Social History  . Marital Status: Married    Spouse Name: N/A    Number of Children: 2  . Years of Education: N/A   Occupational History  . housewife    Social History Main Topics  . Smoking status: Former Smoker -- 48 years    Types: Cigarettes    Quit date: 10/18/1986  . Smokeless tobacco: Never Used  . Alcohol Use: No  . Drug Use: No  . Sexual Activity: Yes    Birth Control/ Protection: None   Other Topics Concern  . Not on file   Social History Narrative      Physical Exam: BP 166/82 mmHg  Pulse 84  Ht 5' 3"  (1.6 m)  Wt 213 lb 4 oz (96.73 kg)  BMI 37.79 kg/m2 Constitutional: generally well-appearing Psychiatric: alert and oriented x3 Abdomen: soft, nontender, nondistended, no obvious ascites, no peritoneal signs, normal bowel sounds No lower extremity edema    Assessment and plan: 73 y.o. female with cirrhosis, persistent intermittent diarrhea  She is going to try Imodium almost daily scheduled basis. She will call to report on her symptoms in 4-5 weeks and if she is still very bothered by diarrhea think she might need repeat colonoscopy to check for colitis, microscopic colitis. She has very well compensated cirrhosis. She is due  for hepatoma screening with ultrasound and alpha-fetoprotein which we will order for her. She will return to see me in 6 months and sooner if needed

## 2014-08-20 LAB — AFP TUMOR MARKER: AFP-Tumor Marker: 4.2 ng/mL (ref <6.1)

## 2014-08-23 ENCOUNTER — Ambulatory Visit (HOSPITAL_COMMUNITY)
Admission: RE | Admit: 2014-08-23 | Discharge: 2014-08-23 | Disposition: A | Discharge status: Home / Self Care | Payer: Medicare HMO | Source: Ambulatory Visit | Attending: Gastroenterology | Admitting: Gastroenterology

## 2014-08-23 ENCOUNTER — Other Ambulatory Visit: Payer: Self-pay | Admitting: Internal Medicine

## 2014-08-23 DIAGNOSIS — K838 Other specified diseases of biliary tract: Secondary | ICD-10-CM | POA: Diagnosis not present

## 2014-08-23 DIAGNOSIS — R109 Unspecified abdominal pain: Secondary | ICD-10-CM | POA: Diagnosis present

## 2014-08-23 DIAGNOSIS — R161 Splenomegaly, not elsewhere classified: Secondary | ICD-10-CM | POA: Insufficient documentation

## 2014-08-23 DIAGNOSIS — K7460 Unspecified cirrhosis of liver: Secondary | ICD-10-CM

## 2014-08-23 NOTE — Telephone Encounter (Signed)
Refill request

## 2014-09-04 ENCOUNTER — Ambulatory Visit (HOSPITAL_BASED_OUTPATIENT_CLINIC_OR_DEPARTMENT_OTHER)
Admission: RE | Admit: 2014-09-04 | Discharge: 2014-09-04 | Disposition: A | Discharge status: Home / Self Care | Payer: Medicare HMO | Source: Ambulatory Visit | Attending: Internal Medicine | Admitting: Internal Medicine

## 2014-09-04 ENCOUNTER — Ambulatory Visit (INDEPENDENT_AMBULATORY_CARE_PROVIDER_SITE_OTHER): Payer: Medicare HMO | Admitting: Internal Medicine

## 2014-09-04 ENCOUNTER — Encounter: Payer: Self-pay | Admitting: Internal Medicine

## 2014-09-04 VITALS — BP 172/73 | HR 82 | Temp 98.0°F | Resp 16 | Ht 64.0 in | Wt 216.0 lb

## 2014-09-04 DIAGNOSIS — R600 Localized edema: Secondary | ICD-10-CM

## 2014-09-04 DIAGNOSIS — M7989 Other specified soft tissue disorders: Secondary | ICD-10-CM | POA: Insufficient documentation

## 2014-09-04 DIAGNOSIS — L304 Erythema intertrigo: Secondary | ICD-10-CM

## 2014-09-04 DIAGNOSIS — F4321 Adjustment disorder with depressed mood: Secondary | ICD-10-CM

## 2014-09-04 MED ORDER — ESCITALOPRAM OXALATE 5 MG PO TABS: 5.0000 mg | ORAL_TABLET | Freq: Every day | ORAL | Status: DC

## 2014-09-04 MED ORDER — HYDROCHLOROTHIAZIDE 12.5 MG PO CAPS: ORAL_CAPSULE | ORAL | Status: DC

## 2014-09-04 MED ORDER — NYSTATIN-TRIAMCINOLONE 100000-0.1 UNIT/GM-% EX OINT: 1.0000 "application " | TOPICAL_OINTMENT | Freq: Two times a day (BID) | CUTANEOUS | Status: DC

## 2014-09-04 NOTE — Progress Notes (Signed)
I left Amanda [name] a detailed  message in regards to her doppler results-eh

## 2014-09-04 NOTE — Progress Notes (Signed)
Subjective:    Patient ID: Amanda [name], female    DOB: 1940-10-23, 73 y.o.   MRN: [id number]  HPI 11/2 GI note Assessment and plan: 73 y.o. female with cirrhosis, persistent intermittent diarrhea  She is going to try Imodium almost daily scheduled basis. She will call to report on her symptoms in 4-5 weeks and if she is still very bothered by diarrhea think she might need repeat colonoscopy to check for colitis, microscopic colitis. She has very well compensated cirrhosis. She is due for hepatoma screening with ultrasound and alpha-fetoprotein which we will order for her. She will return to see me in 6 months and sooner if needed  Today   Amanda [name] is here for several issues  Bilateral edema both legs left is worse  FBS at home much better,  Some are less than 100.  Followed by endocrine  Daughter in law dying of metastatic cancer.    Pt very tearful in office.  Daily depression.  NO S/H ideation  Sleeping too much .  Hard to concentrate.    Rash under both breasts    Allergies  Allergen Reactions  . Tape Hives  . Doxycycline Hives, Swelling and Rash  . Latex Hives, Itching and Rash   Past Medical History  Diagnosis Date  . Hypertension   . Hyperlipidemia   . Diabetes mellitus 2008  . Depression   . Obesity   . Psoriasis   . Polycythemia     Dr. Elease Hashimoto- [facility]  . Herpes simplex   . Gout   . Hyperglycemia 05/31/2013  . C. difficile colitis   . Cirrhosis   . Cancer     SKIN  . Neuromuscular disorder     BELL PALSY   Past Surgical History  Procedure Laterality Date  . Breast surgery Left     milk duct  . Total abdominal hysterectomy w/ bilateral salpingoophorectomy      for heavy periods with appendectomy  . Tonsillectomy    . Colonoscopy     History   Social History  . Marital Status: Married    Spouse Name: N/A    Number of Children: 2  . Years of Education: N/A   Occupational History  . housewife    Social History Main Topics  . Smoking  status: Former Smoker -- 48 years    Types: Cigarettes    Quit date: 10/18/1986  . Smokeless tobacco: Never Used  . Alcohol Use: No  . Drug Use: No  . Sexual Activity: Yes    Birth Control/ Protection: None   Other Topics Concern  . Not on file   Social History Narrative   Family History  Problem Relation Age of Onset  . Diabetes Mother   . Heart disease Mother     CAD  . Hyperlipidemia Mother   . Hypertension Mother   . Kidney disease Mother   . Kidney disease Father   . Breast cancer Maternal Aunt   . Heart disease Maternal Grandmother   . Heart disease Other     maternal aunts and uncles   Patient Active Problem List   Diagnosis Date Noted  . Thrombocytosis 07/04/2014  . Preventive measure 07/04/2014  . Cholelithiasis 02/07/2014  . Clostridium difficile colitis 02/05/2014  . UTI (lower urinary tract infection) 01/29/2014  . Dizziness 01/29/2014  . Diarrhea 01/29/2014  . Dehydration 01/29/2014  . Candidiasis of genitalia in female 11/19/2013  . Cellulitis and abscess of buttock, left 11/01/2013  . Edema  07/09/2013  . Constipation 07/09/2013  . Type 2 diabetes mellitus with neurological manifestations, uncontrolled 06/07/2013  . Leukocytosis, unspecified 06/02/2013  . Bacteria in urine 06/01/2013  . Hyperglycemia 05/31/2013  . 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 HTN (hypertension) 05/31/2013  . History of Bell's palsy 05/26/2013  . DERMATITIS, ATOPIC 11/16/2010  . DIZZINESS 11/16/2010  . WEIGHT GAIN 11/16/2010  . HERPETIC WHITLOW 06/30/2010  . HYPERGLYCEMIA 05/07/2010  . DIASTOLIC DYSFUNCTION 35/45/6256  . DYSPNEA 02/17/2010  . ABSCESS, SKIN 12/22/2009  . SKIN RASH 12/22/2009  . HYPERLIPIDEMIA 12/15/2009  . POLYCYTHEMIA 11/24/2009  . Essential hypertension, benign 11/24/2009  . PSORIASIS 11/24/2009  . Depressive disorder, not elsewhere classified 09/13/2009  . ABSCESS, LEG 09/13/2009  . SPRAIN/STRAIN, ANKLE NOS 01/21/2009  . Adiposity 07/08/2008    . CAFL (chronic airflow limitation) 07/08/2008  . Essential (primary) hypertension 07/08/2008   Current Outpatient Prescriptions on File Prior to Visit  Medication Sig Dispense Refill  . aspirin EC 81 MG tablet Take 81 mg by mouth daily.    . B-D INS SYR ULTRAFINE 1CC/31G 31G X 5/16" 1 ML MISC     . cetaphil (CETAPHIL) lotion Apply topically daily. 236 mL 0  . dicyclomine (BENTYL) 10 MG capsule Take 10 mg by mouth 4 (four) times daily -  before meals and at bedtime.    . diphenhydrAMINE (BENADRYL) 25 mg capsule Take 25 mg by mouth every 6 (six) hours as needed.    . gabapentin (NEURONTIN) 100 MG capsule Take 2 tablets at bedtime as needed for pain. Can increase to 3 tablets if pain persists. 90 capsule 2  . glucose blood (BAYER CONTOUR NEXT TEST) test strip Use to test blood sugar 3 times daily as instructed. Dx code: 250.62 100 each 11  . HYDROcodone-acetaminophen (NORCO/VICODIN) 5-325 MG per tablet Take 2 tablets by mouth every 4 (four) hours as needed for moderate pain.    . hydrocortisone 2.5 % cream Apply 1 application topically 2 (two) times daily.    . hydroxyurea (HYDREA) 500 MG capsule Take 1 capsule (500 mg total) by mouth 2 (two) times daily. 60 capsule 6  . ibuprofen (ADVIL,MOTRIN) 200 MG tablet Take 200 mg by mouth every 6 (six) hours as needed for moderate pain.    Marland Kitchen insulin regular human CONCENTRATED (HUMULIN R) 500 UNIT/ML SOLN injection Inject under skin 100 units before breakfast and dinner 20 mL 2  . lisinopril (PRINIVIL,ZESTRIL) 40 MG tablet TAKE 1 TABLET (40 MG TOTAL) BY MOUTH DAILY. 90 tablet 1  . meloxicam (MOBIC) 7.5 MG tablet Take 1 tablet (7.5 mg total) by mouth daily. 30 tablet 0  . metoprolol (LOPRESSOR) 100 MG tablet Take 100 mg by mouth 2 (two) times daily.    Marland Kitchen nystatin cream (MYCOSTATIN) Apply 1 application topically 2 (two) times daily.    . ondansetron (ZOFRAN) 8 MG tablet Take 8 mg by mouth every 8 (eight) hours as needed for nausea or vomiting.    .  pravastatin (PRAVACHOL) 40 MG tablet TAKE 1 TABLET (40 MG TOTAL) BY MOUTH DAILY. 90 tablet 0  . Probiotic Product (PROBIOTIC DAILY PO) Take by mouth daily.    Marland Kitchen saccharomyces boulardii (FLORASTOR) 250 MG capsule Take 1 capsule (250 mg total) by mouth 2 (two) times daily. 60 capsule 1   No current facility-administered medications on file prior to visit.       Review of Systems See HPI    Objective:   Physical Exam Physical Exam  Nursing note and vitals reviewed.  Constitutional: She is oriented to person, place, and time. She appears well-developed and well-nourished.  HENT:  Head: Normocephalic and atraumatic.  Cardiovascular: Normal rate and regular rhythm. Exam reveals no gallop and no friction rub.  No murmur heard.  Pulmonary/Chest: Breath sounds normal. She has no wheezes. She has no rales.  Neurological: She is alert and oriented to person, place, and time.  Skin: Skin is warm and dry.  Intertriginous rash with satellite lesions both breasts Ext  1+ edema  Left worse.  Pedal pulses reduced but present.  No rash or ulcerations Psychiatric: She has a normal mood and affect. Her behavior is normal.         Assessment & Plan:  Edema  LLE   Will check venous U/S today.  HCtZ 12.5 mg qod  Situational depression  Lexapro 5 mg daily.  Advised to make appt with Beth at [facility] and with [facility] congregational nurse  Fungal intertrigo   Mycolog BId   See me in January or sooner prn

## 2014-09-04 NOTE — Patient Instructions (Addendum)
See me in January 30 mins  To xray today   Take diuretic every other day  Call Social worker Beth at [employer] and make appt  Take Lexapro daily    Apply creme to rash twice a day

## 2014-09-20 ENCOUNTER — Ambulatory Visit: Payer: Medicare HMO | Admitting: Internal Medicine

## 2014-09-30 ENCOUNTER — Encounter: Payer: Self-pay | Admitting: Hematology and Oncology

## 2014-09-30 ENCOUNTER — Ambulatory Visit (HOSPITAL_BASED_OUTPATIENT_CLINIC_OR_DEPARTMENT_OTHER): Payer: Medicare HMO

## 2014-09-30 ENCOUNTER — Other Ambulatory Visit (HOSPITAL_BASED_OUTPATIENT_CLINIC_OR_DEPARTMENT_OTHER): Payer: Medicare HMO

## 2014-09-30 ENCOUNTER — Ambulatory Visit (HOSPITAL_BASED_OUTPATIENT_CLINIC_OR_DEPARTMENT_OTHER): Payer: Medicare HMO | Admitting: Hematology and Oncology

## 2014-09-30 VITALS — BP 174/83 | HR 78 | Temp 97.9°F | Resp 18 | Ht 64.0 in | Wt 216.9 lb

## 2014-09-30 DIAGNOSIS — D473 Essential (hemorrhagic) thrombocythemia: Secondary | ICD-10-CM

## 2014-09-30 DIAGNOSIS — Z23 Encounter for immunization: Secondary | ICD-10-CM

## 2014-09-30 DIAGNOSIS — B372 Candidiasis of skin and nail: Secondary | ICD-10-CM

## 2014-09-30 DIAGNOSIS — D45 Polycythemia vera: Secondary | ICD-10-CM

## 2014-09-30 DIAGNOSIS — D75839 Thrombocytosis, unspecified: Secondary | ICD-10-CM

## 2014-09-30 DIAGNOSIS — D72829 Elevated white blood cell count, unspecified: Secondary | ICD-10-CM

## 2014-09-30 LAB — CBC WITH DIFFERENTIAL/PLATELET
BASO%: 1.6 % (ref 0.0–2.0)
BASOS ABS: 0.3 10*3/uL — AB (ref 0.0–0.1)
EOS ABS: 0.5 10*3/uL (ref 0.0–0.5)
EOS%: 2.6 % (ref 0.0–7.0)
HCT: 47.4 % — ABNORMAL HIGH (ref 34.8–46.6)
HEMOGLOBIN: 15.2 g/dL (ref 11.6–15.9)
LYMPH%: 10.9 % — AB (ref 14.0–49.7)
MCH: 31.9 pg (ref 25.1–34.0)
MCHC: 32.1 g/dL (ref 31.5–36.0)
MCV: 99.2 fL (ref 79.5–101.0)
MONO#: 0.8 10*3/uL (ref 0.1–0.9)
MONO%: 4.2 % (ref 0.0–14.0)
NEUT#: 14.8 10*3/uL — ABNORMAL HIGH (ref 1.5–6.5)
NEUT%: 80.7 % — AB (ref 38.4–76.8)
PLATELETS: 746 10*3/uL — AB (ref 145–400)
RBC: 4.78 10*6/uL (ref 3.70–5.45)
RDW: 14.9 % — ABNORMAL HIGH (ref 11.2–14.5)
WBC: 18.4 10*3/uL — ABNORMAL HIGH (ref 3.9–10.3)
lymph#: 2 10*3/uL (ref 0.9–3.3)

## 2014-09-30 MED ORDER — HYDROXYUREA 500 MG PO CAPS: 1500.0000 mg | ORAL_CAPSULE | Freq: Every day | ORAL | Status: DC

## 2014-09-30 MED ORDER — FLUCONAZOLE 100 MG PO TABS: 100.0000 mg | ORAL_TABLET | Freq: Every day | ORAL | Status: DC

## 2014-09-30 NOTE — Assessment & Plan Note (Signed)
She has missed many days of hydroxyurea because she has big financial burden and she is concerned about switching insurance in the near future. I warned her, citing high white blood cell count and platelet count, that would predispose her with increased risk of heart attack and stroke. The patient is willing to go back on treatment and pain out of pocket for now. I sent a new prescription of hydroxyurea for 3 tablets daily. She would need one unit of phlebotomy today. She would need with monthly CBC until I see her back in March. She will continue aspirin for now.

## 2014-09-30 NOTE — Progress Notes (Signed)
Required 4 venipunctures to get fair  blood flow for phlebotomy. IV clotted x 3 .  PEr Dr Alvy Bimler, I told pt to keep her next scheduled appointment .

## 2014-09-30 NOTE — Progress Notes (Signed)
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 The patient had bone marrow aspirate and biopsy performed in [place] and confirmed myeloproliferative disorder. We do not have records of those results. She had been treated with phlebotomy, hydroxyurea and aspirin.  The patient also have liver cirrhosis with splenomegaly, on observation On 07/04/2014, hydroxyurea was increased to 2 tablets daily except on Saturdays and Sundays she take 3 tablets On 09/30/2014, I increase hydroxyurea to 3 tablets daily  INTERVAL HISTORY: Please see below for problem oriented charting. She is not compliant taking her medication. She has not refill her prescription and is hoping to wait until January before she refill due to change of insurance. She has recurrence skin candidiasis. She felt that her blood sugar is under control. Apart from skin infection, she denies other infection.  REVIEW OF SYSTEMS:   Constitutional: Denies fevers, chills or abnormal weight loss Eyes: Denies blurriness of vision Ears, nose, mouth, throat, and face: Denies mucositis or sore throat Respiratory: Denies cough, dyspnea or wheezes Cardiovascular: Denies palpitation, chest discomfort or lower extremity swelling Gastrointestinal:  Denies nausea, heartburn or change in bowel habits Lymphatics: Denies new lymphadenopathy or easy bruising Neurological:Denies numbness, tingling or new weaknesses Behavioral/Psych: Mood is stable, no new changes  All other systems were reviewed with the patient and are  negative.  I have reviewed the past medical history, past surgical history, social history and family history with the patient and they are unchanged from previous note.  ALLERGIES:  is allergic to tape; doxycycline; and latex.  MEDICATIONS:  Current Outpatient Prescriptions  Medication Sig Dispense Refill  . aspirin EC 81 MG tablet Take 81 mg by mouth daily.    . B-D INS SYR ULTRAFINE 1CC/31G 31G X 5/16" 1 ML MISC     . cetaphil (CETAPHIL) lotion Apply topically daily. 236 mL 0  . dicyclomine (BENTYL) 10 MG capsule Take 10 mg by mouth 4 (four) times daily -  before meals and at bedtime.    . diphenhydrAMINE (BENADRYL) 25 mg capsule Take 25 mg by mouth every 6 (six) hours as needed.    Marland Kitchen escitalopram (LEXAPRO) 5 MG tablet Take 1 tablet (5 mg total) by mouth daily. 30 tablet 1  . gabapentin (NEURONTIN) 100 MG capsule Take 2 tablets at bedtime as needed for pain. Can increase to 3 tablets if pain persists. 90 capsule 2  . glucose blood (BAYER CONTOUR NEXT TEST) test strip Use to test blood sugar 3 times daily as instructed. Dx code: 250.62 100 each 11  . hydrochlorothiazide (MICROZIDE) 12.5 MG capsule Take one tablet every other day 30 capsule 0  . HYDROcodone-acetaminophen (NORCO/VICODIN) 5-325 MG per tablet Take 2 tablets by mouth every 4 (four) hours as needed for moderate pain.    . hydrocortisone 2.5 % cream Apply 1 application topically 2 (two) times daily.    . hydroxyurea (HYDREA) 500 MG capsule Take 1 capsule (500 mg total) by mouth 2 (two) times daily. 60 capsule 6  . ibuprofen (ADVIL,MOTRIN) 200 MG tablet Take 200 mg by mouth every 6 (six) hours as needed for moderate pain.    Marland Kitchen  insulin regular human CONCENTRATED (HUMULIN R) 500 UNIT/ML SOLN injection Inject under skin 100 units before breakfast and dinner 20 mL 2  . lisinopril (PRINIVIL,ZESTRIL) 40 MG tablet TAKE 1 TABLET (40 MG TOTAL) BY MOUTH DAILY. 90 tablet 1  . loperamide (IMODIUM A-D) 2 MG tablet Take 2 mg by mouth daily.     . meloxicam (MOBIC) 7.5 MG tablet Take 1 tablet (7.5 mg total) by mouth daily. 30 tablet 0  . metoprolol (LOPRESSOR) 100 MG tablet Take 100 mg by mouth 2 (two) times daily.    Marland Kitchen nystatin cream (MYCOSTATIN) Apply 1 application topically 2 (two) times daily.    Marland Kitchen nystatin-triamcinolone ointment (MYCOLOG) Apply 1 application topically 2 (two) times daily. 30 g 1  . ondansetron (ZOFRAN) 8 MG tablet Take 8 mg by mouth every 8 (eight) hours as needed for nausea or vomiting.    . pravastatin (PRAVACHOL) 40 MG tablet TAKE 1 TABLET (40 MG TOTAL) BY MOUTH DAILY. 90 tablet 0  . Probiotic Product (PROBIOTIC DAILY PO) Take by mouth daily.    Marland Kitchen saccharomyces boulardii (FLORASTOR) 250 MG capsule Take 1 capsule (250 mg total) by mouth 2 (two) times daily. 60 capsule 1  . fluconazole (DIFLUCAN) 100 MG tablet Take 1 tablet (100 mg total) by mouth daily. 7 tablet 0  . hydroxyurea (HYDREA) 500 MG capsule Take 3 capsules (1,500 mg total) by mouth daily. May take with food to minimize GI side effects. 90 capsule 0   No current facility-administered medications for this visit.    PHYSICAL EXAMINATION: ECOG PERFORMANCE STATUS: 1 - Symptomatic but completely ambulatory  Filed Vitals:   09/30/14 0822  BP: 174/83  Pulse: 78  Temp: 97.9 F (36.6 C)  Resp: 18   Filed Weights   09/30/14 0822  Weight: 216 lb 14.4 oz (98.385 kg)    GENERAL:alert, no distress and comfortable. She is morbidly obese SKIN: Noticed significant skin candidiasis under the full of her breast EYES: normal, Conjunctiva are pink and non-injected, sclera clear Musculoskeletal:no cyanosis of digits and no clubbing  NEURO: alert & oriented x 3 with fluent speech, no focal motor/sensory deficits  LABORATORY DATA:  I have reviewed the data as listed    Component Value Date/Time   NA 134* 03/29/2014 1435   NA 133* 12/10/2013 1239   K 3.9 03/29/2014 1435   K 4.0 12/10/2013 1239   CL 100 03/29/2014 1435   CL 103 02/16/2013 0900   CO2  25 03/29/2014 1435   CO2 21* 12/10/2013 1239   GLUCOSE 403* 03/29/2014 1435   GLUCOSE 635* 12/10/2013 1239   GLUCOSE 396* 02/16/2013 0900   GLUCOSE 292 06/02/2010   BUN 14 03/29/2014 1435   BUN 7.9 12/10/2013 1239   CREATININE 0.6 03/29/2014 1435   CREATININE 1.2* 12/10/2013 1239   CALCIUM 9.7 03/29/2014 1435   CALCIUM 9.5 12/10/2013 1239   PROT 7.1 03/29/2014 1435   PROT 6.7 12/10/2013 1239   ALBUMIN 4.0 03/29/2014 1435   ALBUMIN 3.7 12/10/2013 1239   AST 30 03/29/2014 1435   AST 27 12/10/2013 1239   ALT 19 03/29/2014 1435   ALT 18 12/10/2013 1239   ALKPHOS 91 03/29/2014 1435   ALKPHOS 108 12/10/2013 1239   BILITOT 0.7 03/29/2014 1435   BILITOT 0.83 12/10/2013 1239   GFRNONAA >90 02/01/2014 0911   GFRAA >90 02/01/2014 0911    No results found for: SPEP, UPEP  Lab Results  Component Value Date   WBC 18.4* 09/30/2014   NEUTROABS 14.8*  09/30/2014   HGB 15.2 09/30/2014   HCT 47.4* 09/30/2014   MCV 99.2 09/30/2014   PLT 746* 09/30/2014      Chemistry      Component Value Date/Time   NA 134* 03/29/2014 1435   NA 133* 12/10/2013 1239   K 3.9 03/29/2014 1435   K 4.0 12/10/2013 1239   CL 100 03/29/2014 1435   CL 103 02/16/2013 0900   CO2 25 03/29/2014 1435   CO2 21* 12/10/2013 1239   BUN 14 03/29/2014 1435   BUN 7.9 12/10/2013 1239   CREATININE 0.6 03/29/2014 1435   CREATININE 1.2* 12/10/2013 1239      Component Value Date/Time   CALCIUM 9.7 03/29/2014 1435   CALCIUM 9.5 12/10/2013 1239   ALKPHOS 91 03/29/2014 1435   ALKPHOS 108 12/10/2013 1239   AST 30 03/29/2014 1435   AST 27 12/10/2013 1239   ALT 19 03/29/2014 1435   ALT 18 12/10/2013 1239   BILITOT 0.7 03/29/2014 1435   BILITOT 0.83 12/10/2013 1239     ASSESSMENT & PLAN:  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 I warned her, citing high white blood cell count and platelet count,  that would predispose her with increased risk of heart attack and stroke. The patient is willing to go back on treatment and pain out of pocket for now. I sent a new prescription of hydroxyurea for 3 tablets daily. She would need one unit of phlebotomy today. She would need with monthly CBC until I see her back in March. She will continue aspirin for now.  Leukocytosis This is related to the spectrum of her myeloproliferative disorder. I am increasing the dose of hydroxyurea as above.  Thrombocytosis This is related to myeloproliferative disorder. I will increase her hydroxyurea dose as above.  Candidiasis of skin This is refractory to topical ointment. I gave her 7 days of fluconazole prescription to see if this would help clear her skin candidiasis.   Orders Placed This Encounter  Procedures  . CBC with Differential    Standing Status: Future     Number of Occurrences:      Standing Expiration Date: 11/04/2015  . Ferritin    Standing Status: Future     Number of Occurrences:      Standing Expiration Date: 11/04/2015   All questions were answered. The patient knows to call the clinic with any problems, questions or concerns. No barriers to learning was detected. I spent 40 minutes counseling the patient face to face. The total time spent in the appointment was 55 minutes and more than 50% was on counseling and review of test results     [facility], [place], MD 09/30/2014 8:49 AM

## 2014-09-30 NOTE — Patient Instructions (Addendum)
Therapeutic Phlebotomy, Care After  You had 345 cc of blood removed today for your phlebotomy. Keep your next appointment with the cancer center.  Refer to this sheet in the next few weeks. These instructions provide you with information on caring for yourself after your procedure. Your caregiver may also give you more specific instructions. Your treatment has been planned according to current medical practices, but problems sometimes occur. Call your caregiver if you have any problems or questions after your procedure. HOME CARE INSTRUCTIONS Most people can go back to their normal activities right away. Before you leave, be sure to ask if there is anything you should or should not do. In general, it would be wise to:  Keep the bandage dry. You can remove the bandage after about 5 hours.  Eat well-balanced meals for the next 24 hours.  Drink enough fluids to keep your urine clear or pale yellow.  Avoid drinking alcohol minimally until after eating.  Avoid smoking for at least 30 minutes after the procedure.  Avoid strenuous physical activity or heavy lifting or pulling for about 5 hours after the procedure.  Athletes should avoid strenuous exercise for 12 hours or more.  Change positions slowly for the remainder of the day to prevent light-headedness or fainting.  If you feel light-headed, lie down until the feeling subsides.  If you have bleeding from the needle insertion site, elevate your arm and press firmly on the site until the bleeding stops.  If bruising or bleeding appears under the skin, apply ice to the area for 15 to 20 minutes, 3 to 4 times per day. Put the ice in a plastic bag and place a towel between the bag of ice and your skin. Do this while you are awake for the first 24 hours. The ice packs can be stopped before 24 hours if the swelling goes away. If swelling persists after 24 hours, a warm, moist washcloth can be applied to the area for 15 to 20 minutes, 3 to 4 times  per day. The warm, moist treatments can be stopped when the swelling goes away.  It is important to continue further therapeutic phlebotomy as directed by your caregiver. SEEK MEDICAL CARE IF:  There is bleeding or fluid leaking from the needle insertion site.  The needle insertion site becomes swollen, red, or sore.  You feel light-headed, dizzy or nauseated, and the feeling does not go away.  You notice new bruising at the needle insertion site.  You feel more weak or tired than normal.  You develop a fever. SEEK IMMEDIATE MEDICAL CARE IF:   There is increased bleeding, pain, or swelling from the needle insertion site.  You have severe nausea or vomiting.  You have chest pain.  You have trouble breathing. MAKE SURE YOU:  Understand these instructions.  Will watch your condition.  Will get help right away if you are not doing well or get worse. Document Released: 03/08/2011 Document Revised: 02/18/2014 Document Reviewed: 03/08/2011 [facility] Patient Information 2015 [place], [place]. This information is not intended to replace advice given to you by your health care provider. Make sure you discuss any questions you have with your health care provider.

## 2014-09-30 NOTE — Assessment & Plan Note (Signed)
This is refractory to topical ointment. I gave her 7 days of fluconazole prescription to see if this would help clear her skin candidiasis.

## 2014-09-30 NOTE — Assessment & Plan Note (Signed)
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 I will increase her hydroxyurea dose as above.

## 2014-09-30 NOTE — Assessment & Plan Note (Signed)
This is related to the spectrum of her myeloproliferative disorder. I am increasing the dose of hydroxyurea as above.

## 2014-10-22 NOTE — Progress Notes (Signed)
Subjective:    Patient ID: Amanda [name], female    DOB: 02/20/1941, 74 y.o.   MRN: [id number]  HPI Todays note  Amanda [name] is here with her husband  to follow up on her depressive symptoms and her LE edema.   See recent visits with hematology and GI   - I started her on Lexapro  For her depression and HCTZ low dose qod for her edema  Amanda [name] is very teary as her daughter -in-law passed away from metastatic Cancer in late December.  52 yo grandson not talking,  Her son talking about moving and this upsets pt.    She does like the Lexapro and wants to stay on this.   She is going to see hospice counselor next week  See BP She states she is out of some of her BP meds but not sure which.   She has just changed insurances.      No Le edema  Taking low dose HCTZ qod  Had recent phlebotomy but difficult process and pt tells me they were able to remove 350 cc's .    Has headache off and on.   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 I warned her, citing high white blood cell count and platelet count, that would predispose her with increased risk of heart attack and stroke. The patient is willing to go back on treatment and pain out of pocket for now. I sent a new prescription of hydroxyurea for 3 tablets daily. She would need one unit of phlebotomy today. She would need with monthly CBC until I see her back in March. She will continue aspirin for now.  Leukocytosis This is related to the spectrum of her myeloproliferative disorder. I am increasing the dose of hydroxyurea as above.  Thrombocytosis This is related to myeloproliferative disorder. I will increase her hydroxyurea dose as above.  Candidiasis of skin This is refractory to topical ointment. I gave her 7  days of fluconazole prescription to see if this would help clear her skin candidiasis.  11/18 my note Edema LLE Will check venous U/S today. HCtZ 12.5 mg qod  Situational depression Lexapro 5 mg daily. Advised to make appt with [facility] at [facility] and with [facility] congregational nurse  Fungal intertrigo Mycolog BId   See me in January or sooner prn        11/2 GI note 74 y.o. female with cirrhosis, persistent intermittent diarrhea  She is going to try Imodium almost daily scheduled basis. She will call to report on her symptoms in 4-5 weeks and if she is still very bothered by diarrhea think she might need repeat colonoscopy to check for colitis, microscopic colitis. She has very well compensated cirrhosis. She is due for hepatoma screening with ultrasound and alpha-fetoprotein which we will order for her. She will return to see me in 6 months and sooner if needed   Allergies  Allergen Reactions  . Tape Hives  . Doxycycline Hives, Swelling and Rash  . Latex Hives, Itching and Rash   Past Medical History  Diagnosis Date  . Hypertension   . Hyperlipidemia   . Diabetes mellitus 2008  . Depression   . Obesity   . Psoriasis   . Polycythemia  Dr. Elease Hashimoto- [facility]  . Herpes simplex   . Gout   . Hyperglycemia 05/31/2013  . C. difficile colitis   . Cirrhosis   . Cancer     SKIN  . Neuromuscular disorder     BELL PALSY  . Candidiasis of skin 09/30/2014   Past Surgical History  Procedure Laterality Date  . Breast surgery Left     milk duct  . Total abdominal hysterectomy w/ bilateral salpingoophorectomy      for heavy periods with appendectomy  . Tonsillectomy    . Colonoscopy     History   Social History  . Marital Status: Married    Spouse Name: N/A    Number of Children: 2  . Years of Education: N/A   Occupational History  . housewife    Social History Main Topics  . Smoking status: Former Smoker -- 48 years    Types: Cigarettes    Quit date:  10/18/1986  . Smokeless tobacco: Never Used  . Alcohol Use: No  . Drug Use: No  . Sexual Activity: Yes    Birth Control/ Protection: None   Other Topics Concern  . Not on file   Social History Narrative   Family History  Problem Relation Age of Onset  . Diabetes Mother   . Heart disease Mother     CAD  . Hyperlipidemia Mother   . Hypertension Mother   . Kidney disease Mother   . Kidney disease Father   . Breast cancer Maternal Aunt   . Heart disease Maternal Grandmother   . Heart disease Other     maternal aunts and uncles   Patient Active Problem List   Diagnosis Date Noted  . Candidiasis of skin 09/30/2014  . Thrombocytosis 07/04/2014  . Preventive measure 07/04/2014  . Cholelithiasis 02/07/2014  . Clostridium difficile colitis 02/05/2014  . UTI (lower urinary tract infection) 01/29/2014  . Dizziness 01/29/2014  . Diarrhea 01/29/2014  . Dehydration 01/29/2014  . Candidiasis of genitalia in female 11/19/2013  . Cellulitis and abscess of buttock, left 11/01/2013  . Edema 07/09/2013  . Constipation 07/09/2013  . Type 2 diabetes mellitus with neurological manifestations, uncontrolled 06/07/2013  . Leukocytosis 06/02/2013  . Bacteria in urine 06/01/2013  . Hyperglycemia 05/31/2013  . [name] 05/31/2013  . HTN (hypertension) 05/31/2013  . History of Bell's palsy 05/26/2013  . DERMATITIS, ATOPIC 11/16/2010  . DIZZINESS 11/16/2010  . WEIGHT GAIN 11/16/2010  . HERPETIC WHITLOW 06/30/2010  . HYPERGLYCEMIA 05/07/2010  . DIASTOLIC DYSFUNCTION 85/11/7739  . DYSPNEA 02/17/2010  . ABSCESS, SKIN 12/22/2009  . SKIN RASH 12/22/2009  . HYPERLIPIDEMIA 12/15/2009  . POLYCYTHEMIA 11/24/2009  . Essential hypertension, benign 11/24/2009  . PSORIASIS 11/24/2009  . Depressive disorder, not elsewhere classified 09/13/2009  . ABSCESS, LEG 09/13/2009  . SPRAIN/STRAIN, ANKLE NOS 01/21/2009  . Adiposity 07/08/2008  . CAFL (chronic airflow limitation) 07/08/2008  .  Essential (primary) hypertension 07/08/2008   Current Outpatient Prescriptions on File Prior to Visit  Medication Sig Dispense Refill  . aspirin EC 81 MG tablet Take 81 mg by mouth daily.    . B-D INS SYR ULTRAFINE 1CC/31G 31G X 5/16" 1 ML MISC     . cetaphil (CETAPHIL) lotion Apply topically daily. 236 mL 0  . dicyclomine (BENTYL) 10 MG capsule Take 10 mg by mouth 4 (four) times daily -  before meals and at bedtime.    . diphenhydrAMINE (BENADRYL) 25 mg capsule Take 25 mg by mouth every 6 (six) hours  as needed.    Marland Kitchen escitalopram (LEXAPRO) 5 MG tablet Take 1 tablet (5 mg total) by mouth daily. 30 tablet 1  . fluconazole (DIFLUCAN) 100 MG tablet Take 1 tablet (100 mg total) by mouth daily. 7 tablet 0  . gabapentin (NEURONTIN) 100 MG capsule Take 2 tablets at bedtime as needed for pain. Can increase to 3 tablets if pain persists. 90 capsule 2  . glucose blood (BAYER CONTOUR NEXT TEST) test strip Use to test blood sugar 3 times daily as instructed. Dx code: 250.62 100 each 11  . hydrochlorothiazide (MICROZIDE) 12.5 MG capsule Take one tablet every other day 30 capsule 0  . HYDROcodone-acetaminophen (NORCO/VICODIN) 5-325 MG per tablet Take 2 tablets by mouth every 4 (four) hours as needed for moderate pain.    . hydrocortisone 2.5 % cream Apply 1 application topically 2 (two) times daily.    . hydroxyurea (HYDREA) 500 MG capsule Take 1 capsule (500 mg total) by mouth 2 (two) times daily. 60 capsule 6  . hydroxyurea (HYDREA) 500 MG capsule Take 3 capsules (1,500 mg total) by mouth daily. May take with food to minimize GI side effects. 90 capsule 0  . ibuprofen (ADVIL,MOTRIN) 200 MG tablet Take 200 mg by mouth every 6 (six) hours as needed for moderate pain.    Marland Kitchen insulin regular human CONCENTRATED (HUMULIN R) 500 UNIT/ML SOLN injection Inject under skin 100 units before breakfast and dinner 20 mL 2  . lisinopril (PRINIVIL,ZESTRIL) 40 MG tablet TAKE 1 TABLET (40 MG TOTAL) BY MOUTH DAILY. 90 tablet 1  .  loperamide (IMODIUM A-D) 2 MG tablet Take 2 mg by mouth daily.    . meloxicam (MOBIC) 7.5 MG tablet Take 1 tablet (7.5 mg total) by mouth daily. 30 tablet 0  . metoprolol (LOPRESSOR) 100 MG tablet Take 100 mg by mouth 2 (two) times daily.    Marland Kitchen nystatin cream (MYCOSTATIN) Apply 1 application topically 2 (two) times daily.    Marland Kitchen nystatin-triamcinolone ointment (MYCOLOG) Apply 1 application topically 2 (two) times daily. 30 g 1  . ondansetron (ZOFRAN) 8 MG tablet Take 8 mg by mouth every 8 (eight) hours as needed for nausea or vomiting.    . pravastatin (PRAVACHOL) 40 MG tablet TAKE 1 TABLET (40 MG TOTAL) BY MOUTH DAILY. 90 tablet 0  . Probiotic Product (PROBIOTIC DAILY PO) Take by mouth daily.    Marland Kitchen saccharomyces boulardii (FLORASTOR) 250 MG capsule Take 1 capsule (250 mg total) by mouth 2 (two) times daily. 60 capsule 1   No current facility-administered medications on file prior to visit.        Review of Systems See HPI    Objective:   Physical Exam  Physical Exam  Nursing note and vitals reviewed.  Constitutional: She is oriented to person, place, and time. She appears well-developed and well-nourished.  HENT:  Head: Normocephalic and atraumatic.  Cardiovascular: Normal rate and regular rhythm. Exam reveals no gallop and no friction rub.  No murmur heard.  Pulmonary/Chest: Breath sounds normal. She has no wheezes. She has no rales.  Neurological: She is alert and oriented to person, place, and time.  Skin: Skin is warm and dry.  Psychiatric: She has a normal mood and affect. Her behavior is normal.          Assessment & Plan:  HTN:  Finances a big issue for pt.    She has been out of one of her meds.  Will re-order today  Advised to be sure to  take daily.   Headache  Multifactorial  HTN not at goal,   PV ,   Recent death of daughter in law  Bereavementsituational depression   Continue Lexapro.    Advised to keep appt with hospice  PV :   Continue hydroxyurea  Recent  phlebotomy  DM  Managed by endocrinology   See me in 8 weeks

## 2014-10-23 ENCOUNTER — Ambulatory Visit (INDEPENDENT_AMBULATORY_CARE_PROVIDER_SITE_OTHER): Payer: Medicare Other | Admitting: Internal Medicine

## 2014-10-23 ENCOUNTER — Telehealth: Payer: Self-pay

## 2014-10-23 ENCOUNTER — Encounter: Payer: Self-pay | Admitting: Internal Medicine

## 2014-10-23 VITALS — BP 182/79 | HR 81 | Resp 20 | Ht 64.0 in | Wt 217.0 lb

## 2014-10-23 DIAGNOSIS — I10 Essential (primary) hypertension: Secondary | ICD-10-CM

## 2014-10-23 DIAGNOSIS — Z634 Disappearance and death of family member: Secondary | ICD-10-CM | POA: Diagnosis not present

## 2014-10-23 DIAGNOSIS — G441 Vascular headache, not elsewhere classified: Secondary | ICD-10-CM | POA: Diagnosis not present

## 2014-10-23 DIAGNOSIS — F32A Depression, unspecified: Secondary | ICD-10-CM

## 2014-10-23 DIAGNOSIS — F329 Major depressive disorder, single episode, unspecified: Secondary | ICD-10-CM | POA: Diagnosis not present

## 2014-10-23 DIAGNOSIS — D751 Secondary polycythemia: Secondary | ICD-10-CM

## 2014-10-23 MED ORDER — LISINOPRIL 40 MG PO TABS: ORAL_TABLET | ORAL | Status: DC

## 2014-10-23 MED ORDER — PRAVASTATIN SODIUM 40 MG PO TABS: 40.0000 mg | ORAL_TABLET | Freq: Every day | ORAL | Status: DC

## 2014-10-23 MED ORDER — METOPROLOL TARTRATE 100 MG PO TABS: 100.0000 mg | ORAL_TABLET | Freq: Two times a day (BID) | ORAL | Status: DC

## 2014-10-23 MED ORDER — NYSTATIN-TRIAMCINOLONE 100000-0.1 UNIT/GM-% EX OINT: 1.0000 "application " | TOPICAL_OINTMENT | Freq: Two times a day (BID) | CUTANEOUS | Status: DC

## 2014-10-23 NOTE — Patient Instructions (Signed)
See me in early March  Give 30 mins

## 2014-10-23 NOTE — Telephone Encounter (Signed)
Rowan called to say that he talked with his insurance company about a shingle shot for Amanda [name] and they said that the insurance would pay better if she received this shot at the [facility], but she needs a prescription for it and a note stating that she needs this.

## 2014-10-23 NOTE — Telephone Encounter (Signed)
Amanda [name] needs an RX for the Zoster vaccine so she can get it at [facility].

## 2014-10-24 NOTE — Telephone Encounter (Signed)
rx given

## 2014-11-10 ENCOUNTER — Emergency Department (HOSPITAL_COMMUNITY)
Admission: EM | Admit: 2014-11-10 | Discharge: 2014-11-10 | Disposition: A | Discharge status: Home / Self Care | Payer: Medicare Other | Attending: Emergency Medicine | Admitting: Emergency Medicine

## 2014-11-10 ENCOUNTER — Encounter (HOSPITAL_COMMUNITY): Payer: Self-pay | Admitting: Emergency Medicine

## 2014-11-10 DIAGNOSIS — F329 Major depressive disorder, single episode, unspecified: Secondary | ICD-10-CM | POA: Insufficient documentation

## 2014-11-10 DIAGNOSIS — L03313 Cellulitis of chest wall: Secondary | ICD-10-CM | POA: Diagnosis not present

## 2014-11-10 DIAGNOSIS — E785 Hyperlipidemia, unspecified: Secondary | ICD-10-CM | POA: Diagnosis not present

## 2014-11-10 DIAGNOSIS — Z872 Personal history of diseases of the skin and subcutaneous tissue: Secondary | ICD-10-CM | POA: Insufficient documentation

## 2014-11-10 DIAGNOSIS — Z792 Long term (current) use of antibiotics: Secondary | ICD-10-CM | POA: Diagnosis not present

## 2014-11-10 DIAGNOSIS — I10 Essential (primary) hypertension: Secondary | ICD-10-CM | POA: Insufficient documentation

## 2014-11-10 DIAGNOSIS — M109 Gout, unspecified: Secondary | ICD-10-CM | POA: Insufficient documentation

## 2014-11-10 DIAGNOSIS — Z91040 Latex allergy status: Secondary | ICD-10-CM | POA: Insufficient documentation

## 2014-11-10 DIAGNOSIS — Z8619 Personal history of other infectious and parasitic diseases: Secondary | ICD-10-CM | POA: Diagnosis not present

## 2014-11-10 DIAGNOSIS — Z862 Personal history of diseases of the blood and blood-forming organs and certain disorders involving the immune mechanism: Secondary | ICD-10-CM | POA: Diagnosis not present

## 2014-11-10 DIAGNOSIS — Z7982 Long term (current) use of aspirin: Secondary | ICD-10-CM | POA: Insufficient documentation

## 2014-11-10 DIAGNOSIS — N61 Inflammatory disorders of breast: Secondary | ICD-10-CM | POA: Insufficient documentation

## 2014-11-10 DIAGNOSIS — G709 Myoneural disorder, unspecified: Secondary | ICD-10-CM | POA: Insufficient documentation

## 2014-11-10 DIAGNOSIS — Z794 Long term (current) use of insulin: Secondary | ICD-10-CM | POA: Insufficient documentation

## 2014-11-10 DIAGNOSIS — Z85828 Personal history of other malignant neoplasm of skin: Secondary | ICD-10-CM | POA: Diagnosis not present

## 2014-11-10 DIAGNOSIS — Z79899 Other long term (current) drug therapy: Secondary | ICD-10-CM | POA: Diagnosis not present

## 2014-11-10 DIAGNOSIS — E669 Obesity, unspecified: Secondary | ICD-10-CM | POA: Insufficient documentation

## 2014-11-10 DIAGNOSIS — E119 Type 2 diabetes mellitus without complications: Secondary | ICD-10-CM | POA: Insufficient documentation

## 2014-11-10 DIAGNOSIS — N644 Mastodynia: Secondary | ICD-10-CM | POA: Diagnosis present

## 2014-11-10 MED ORDER — SULFAMETHOXAZOLE-TRIMETHOPRIM 800-160 MG PO TABS
1.0000 | ORAL_TABLET | Freq: Once | ORAL | Status: AC
  Administered 2014-11-10: 1 via ORAL
  Filled 2014-11-10: qty 1

## 2014-11-10 MED ORDER — CEPHALEXIN 500 MG PO CAPS: 500.0000 mg | ORAL_CAPSULE | Freq: Three times a day (TID) | ORAL | Status: DC

## 2014-11-10 MED ORDER — OXYCODONE-ACETAMINOPHEN 5-325 MG PO TABS: 1.0000 | ORAL_TABLET | Freq: Four times a day (QID) | ORAL | Status: DC | PRN

## 2014-11-10 MED ORDER — SULFAMETHOXAZOLE-TRIMETHOPRIM 800-160 MG PO TABS: 1.0000 | ORAL_TABLET | Freq: Two times a day (BID) | ORAL | Status: DC

## 2014-11-10 MED ORDER — CEPHALEXIN 500 MG PO CAPS
500.0000 mg | ORAL_CAPSULE | Freq: Once | ORAL | Status: AC
  Administered 2014-11-10: 500 mg via ORAL
  Filled 2014-11-10: qty 1

## 2014-11-10 MED ORDER — OXYCODONE-ACETAMINOPHEN 5-325 MG PO TABS
1.0000 | ORAL_TABLET | Freq: Once | ORAL | Status: AC
  Administered 2014-11-10: 1 via ORAL
  Filled 2014-11-10: qty 1

## 2014-11-10 NOTE — ED Notes (Addendum)
Pt has leison to lt breast area with redness and dried yellowish center. MD at bedside.  Redness marked and dated with skin marker.

## 2014-11-10 NOTE — ED Notes (Signed)
Awake. Verbally responsive. A/O x4. Resp even and unlabored. No audible adventitious breath sounds noted. ABC's intact.  

## 2014-11-10 NOTE — Discharge Instructions (Signed)

## 2014-11-10 NOTE — ED Notes (Signed)
Pt from home c/o left breast pain. She reports there was a bump about the size of a dime and she washed it off. Now patient exhibits redness and swelling.

## 2014-11-14 NOTE — ED Provider Notes (Signed)
CSN: [id number]     Arrival date & time 11/10/14  1818 History   First MD Initiated Contact with Patient 11/10/14 2006     Chief Complaint  Patient presents with  . left breast  pain      The history is provided by the patient.   patient reports developing mild left breast pain and noticing redness tonight which prompted her visit to the emergency department.  She just noticed this this evening and it appears to be a small area under her left breast.  She denies any injury or trauma to the region.  She has no new clothing.  She denies itching.  No fevers or chills.  No other swelling noted to her breast.  This is a new problem for her.  She does have a history of diabetes.  Past Medical History  Diagnosis Date  . Hypertension   . Hyperlipidemia   . Diabetes mellitus 2008  . Depression   . Obesity   . Psoriasis   . Polycythemia     Dr. Elease Hashimoto- [facility]  . Herpes simplex   . Gout   . Hyperglycemia 05/31/2013  . C. difficile colitis   . Cirrhosis   . Cancer     SKIN  . Neuromuscular disorder     BELL PALSY  . Candidiasis of skin 09/30/2014   Past Surgical History  Procedure Laterality Date  . Breast surgery Left     milk duct  . Total abdominal hysterectomy w/ bilateral salpingoophorectomy      for heavy periods with appendectomy  . Tonsillectomy    . Colonoscopy     Family History  Problem Relation Age of Onset  . Diabetes Mother   . Heart disease Mother     CAD  . Hyperlipidemia Mother   . Hypertension Mother   . Kidney disease Mother   . Kidney disease Father   . Breast cancer Maternal Aunt   . Heart disease Maternal Grandmother   . Heart disease Other     maternal aunts and uncles   History  Substance Use Topics  . Smoking status: Former Smoker -- 48 years    Types: Cigarettes    Quit date: 10/18/1986  . Smokeless tobacco: Never Used  . Alcohol Use: No   OB History    No data available     Review of Systems  All other systems reviewed and are  negative.     Allergies  Tape; Doxycycline; and Latex  Home Medications   Prior to Admission medications   Medication Sig Start Date End Date Taking? Authorizing Provider  aspirin EC 81 MG tablet Take 81 mg by mouth daily.    Historical Provider, MD  B-D INS SYR ULTRAFINE 1CC/31G 31G X 5/16" 1 ML MISC  05/06/14   Historical Provider, MD  cephALEXin (KEFLEX) 500 MG capsule Take 1 capsule (500 mg total) by mouth 3 (three) times daily. 11/10/14   Hoy Morn, MD  cetaphil (CETAPHIL) lotion Apply topically daily. 02/02/14   Kinnie Feil, MD  dicyclomine (BENTYL) 10 MG capsule Take 10 mg by mouth 4 (four) times daily -  before meals and at bedtime.    Historical Provider, MD  diphenhydrAMINE (BENADRYL) 25 mg capsule Take 25 mg by mouth every 6 (six) hours as needed.    Historical Provider, MD  escitalopram (LEXAPRO) 5 MG tablet Take 1 tablet (5 mg total) by mouth daily. 09/04/14   Lanice Shirts, MD  gabapentin (NEURONTIN) 100  MG capsule Take 2 tablets at bedtime as needed for pain. Can increase to 3 tablets if pain persists. 03/08/14   [name], MD  glucose blood (BAYER CONTOUR NEXT TEST) test strip Use to test blood sugar 3 times daily as instructed. Dx code: 250.62 05/23/14   [name], MD  hydrochlorothiazide (MICROZIDE) 12.5 MG capsule Take one tablet every other day 09/04/14   Lanice Shirts, MD  HYDROcodone-acetaminophen (NORCO/VICODIN) 5-325 MG per tablet Take 2 tablets by mouth every 4 (four) hours as needed for moderate pain.    Historical Provider, MD  hydrocortisone 2.5 % cream Apply 1 application topically 2 (two) times daily.    Historical Provider, MD  hydroxyurea (HYDREA) 500 MG capsule Take 3 capsules (1,500 mg total) by mouth daily. May take with food to minimize GI side effects. 09/30/14   [name], MD  ibuprofen (ADVIL,MOTRIN) 200 MG tablet Take 200 mg by mouth every 6 (six) hours as needed for moderate pain.    Historical Provider, MD  insulin  regular human CONCENTRATED (HUMULIN R) 500 UNIT/ML SOLN injection Inject under skin 100 units before breakfast and dinner 08/09/14   [name], MD  lisinopril (PRINIVIL,ZESTRIL) 40 MG tablet TAKE 1 TABLET (40 MG TOTAL) BY MOUTH DAILY. 10/23/14   Lanice Shirts, MD  loperamide (IMODIUM A-D) 2 MG tablet Take 2 mg by mouth daily.    Historical Provider, MD  meloxicam (MOBIC) 7.5 MG tablet Take 1 tablet (7.5 mg total) by mouth daily. Patient not taking: Reported on 10/23/2014 05/14/14   Lanice Shirts, MD  metoprolol (LOPRESSOR) 100 MG tablet Take 1 tablet (100 mg total) by mouth 2 (two) times daily. 10/23/14   Lanice Shirts, MD  nystatin cream (MYCOSTATIN) Apply 1 application topically 2 (two) times daily.    Historical Provider, MD  nystatin-triamcinolone ointment (MYCOLOG) Apply 1 application topically 2 (two) times daily. 10/23/14   Lanice Shirts, MD  ondansetron (ZOFRAN) 8 MG tablet Take 8 mg by mouth every 8 (eight) hours as needed for nausea or vomiting.    Historical Provider, MD  oxyCODONE-acetaminophen (PERCOCET/ROXICET) 5-325 MG per tablet Take 1 tablet by mouth every 6 (six) hours as needed for severe pain. 11/10/14   Hoy Morn, MD  pravastatin (PRAVACHOL) 40 MG tablet Take 1 tablet (40 mg total) by mouth daily. 10/23/14   Lanice Shirts, MD  Probiotic Product (PROBIOTIC DAILY PO) Take by mouth daily.    Historical Provider, MD  saccharomyces boulardii (FLORASTOR) 250 MG capsule Take 1 capsule (250 mg total) by mouth 2 (two) times daily. 02/02/14   Kinnie Feil, MD  sulfamethoxazole-trimethoprim (BACTRIM DS,SEPTRA DS) 800-160 MG per tablet Take 1 tablet by mouth 2 (two) times daily. 11/10/14   Hoy Morn, MD   BP 180/70 mmHg  Pulse 73  Temp(Src) 98.5 F (36.9 C) (Oral)  Resp 18  SpO2 98% Physical Exam  Constitutional: She is oriented to person, place, and time. She appears well-developed and well-nourished.  HENT:  Head: Normocephalic.  Eyes: EOM  are normal.  Neck: Normal range of motion.  Pulmonary/Chest: Effort normal.  Focused area of erythema with a small amount of skin ulceration of her inferior lateral left breast at approximately 4:00.  This does not involve the nipple.  There is no underlying fluctuance or significant induration.  She does have a small amount of surrounding erythema.  This is been marked with a skin marker.  Abdominal: She exhibits no distension.  Musculoskeletal: Normal  range of motion.  Neurological: She is alert and oriented to person, place, and time.  Psychiatric: She has a normal mood and affect.  Nursing note and vitals reviewed.   ED Course  Procedures (including critical care time) Labs Review Labs Reviewed - No data to display  Imaging Review No results found.   EKG Interpretation None      MDM   Final diagnoses:  Cellulitis of breast    Patient be started on antibiotics for what appears to be a small ulcerated region of the left breast with an associated cellulitis.  I do not believe there is a deep space infection at this time.  No indication for acute imaging.  She will need very close follow-up of this region.  She is a diabetic and therefore infection warnings of being given and she understands to return to the ER for new or worsening symptoms including worsening cellulitis despite antibiotics.    Hoy Morn, MD 11/14/14 [phone]

## 2014-11-26 ENCOUNTER — Ambulatory Visit: Payer: Medicare HMO | Admitting: Internal Medicine

## 2014-12-02 ENCOUNTER — Other Ambulatory Visit: Payer: Self-pay | Admitting: Internal Medicine

## 2014-12-03 NOTE — Telephone Encounter (Signed)
Refill request

## 2014-12-03 NOTE — Telephone Encounter (Signed)
error 

## 2014-12-05 ENCOUNTER — Other Ambulatory Visit: Payer: Self-pay | Admitting: *Deleted

## 2014-12-05 MED ORDER — HYDROCHLOROTHIAZIDE 12.5 MG PO CAPS: 12.5000 mg | ORAL_CAPSULE | ORAL | Status: DC

## 2014-12-12 ENCOUNTER — Telehealth: Payer: Self-pay | Admitting: *Deleted

## 2014-12-12 ENCOUNTER — Ambulatory Visit (INDEPENDENT_AMBULATORY_CARE_PROVIDER_SITE_OTHER): Payer: Medicare Other | Admitting: Internal Medicine

## 2014-12-12 ENCOUNTER — Encounter: Payer: Self-pay | Admitting: Internal Medicine

## 2014-12-12 VITALS — BP 175/70 | HR 77 | Resp 16 | Ht 64.0 in | Wt 221.0 lb

## 2014-12-12 DIAGNOSIS — R609 Edema, unspecified: Secondary | ICD-10-CM

## 2014-12-12 DIAGNOSIS — B359 Dermatophytosis, unspecified: Secondary | ICD-10-CM

## 2014-12-12 DIAGNOSIS — H6691 Otitis media, unspecified, right ear: Secondary | ICD-10-CM | POA: Diagnosis not present

## 2014-12-12 DIAGNOSIS — H65191 Other acute nonsuppurative otitis media, right ear: Secondary | ICD-10-CM

## 2014-12-12 MED ORDER — CEFTRIAXONE SODIUM 1 G IJ SOLR
500.0000 mg | Freq: Once | INTRAMUSCULAR | Status: AC
  Administered 2014-12-12: 500 mg via INTRAMUSCULAR

## 2014-12-12 MED ORDER — NYSTATIN-TRIAMCINOLONE 100000-0.1 UNIT/GM-% EX OINT: 1.0000 "application " | TOPICAL_OINTMENT | Freq: Two times a day (BID) | CUTANEOUS | Status: DC

## 2014-12-12 NOTE — Progress Notes (Signed)
Subjective:    Patient ID: Amanda [name], female    DOB: 09/27/1941, 74 y.o.   MRN: [id number]  HPI  10/23/2014 my note Assessment & Plan:  HTN: Finances a big issue for pt. She has been out of one of her meds. Will re-order today Advised to be sure to take daily.   Headache Multifactorial HTN not at goal, PV , Recent death of daughter in law  Bereavementsituational depression Continue Lexapro. Advised to keep appt with hospice  PV : Continue hydroxyurea Recent phlebotomy  DM Managed by endocrinology   See me in 8 weeks           Amanda [name] is here for acute visit.   She reports R ear pain for the last several days. Recent train travel to [place] for a cousin's funeral.    No fever no recent URI  Had bilateral Le edema after returning home from [place].    Edema is better now but will swell at night.  Red scaly rash on left forearm.  Also itchy rash under left breast  Allergies  Allergen Reactions  . Tape Hives  . Doxycycline Hives, Swelling and Rash  . Latex Hives, Itching and Rash   Past Medical History  Diagnosis Date  . Hypertension   . Hyperlipidemia   . Diabetes mellitus 2008  . Depression   . Obesity   . Psoriasis   . Polycythemia     Dr. Elease Hashimoto- [facility]  . Herpes simplex   . Gout   . Hyperglycemia 05/31/2013  . C. difficile colitis   . Cirrhosis   . Cancer     SKIN  . Neuromuscular disorder     BELL PALSY  . Candidiasis of skin 09/30/2014   Past Surgical History  Procedure Laterality Date  . Breast surgery Left     milk duct  . Total abdominal hysterectomy w/ bilateral salpingoophorectomy      for heavy periods with appendectomy  . Tonsillectomy    . Colonoscopy     History   Social History  . Marital Status: Married    Spouse Name: N/A  . Number of Children: 2  . Years of Education: N/A   Occupational History  . housewife    Social History Main Topics  . Smoking status: Former Smoker -- 48 years    Types:  Cigarettes    Quit date: 10/18/1986  . Smokeless tobacco: Never Used  . Alcohol Use: No  . Drug Use: No  . Sexual Activity: Yes    Birth Control/ Protection: None   Other Topics Concern  . Not on file   Social History Narrative   Family History  Problem Relation Age of Onset  . Diabetes Mother   . Heart disease Mother     CAD  . Hyperlipidemia Mother   . Hypertension Mother   . Kidney disease Mother   . Kidney disease Father   . Breast cancer Maternal Aunt   . Heart disease Maternal Grandmother   . Heart disease Other     maternal aunts and uncles   Patient Active Problem List   Diagnosis Date Noted  . Candidiasis of skin 09/30/2014  . Thrombocytosis 07/04/2014  . Preventive measure 07/04/2014  . Cholelithiasis 02/07/2014  . Clostridium difficile colitis 02/05/2014  . UTI (lower urinary tract infection) 01/29/2014  . Dizziness 01/29/2014  . Diarrhea 01/29/2014  . Dehydration 01/29/2014  . Candidiasis of genitalia in female 11/19/2013  . Cellulitis and abscess of buttock,  left 11/01/2013  . Edema 07/09/2013  . Constipation 07/09/2013  . Type 2 diabetes mellitus with neurological manifestations, uncontrolled 06/07/2013  . Leukocytosis 06/02/2013  . Bacteria in urine 06/01/2013  . Hyperglycemia 05/31/2013  . [name] 05/31/2013  . HTN (hypertension) 05/31/2013  . History of Bell's palsy 05/26/2013  . DERMATITIS, ATOPIC 11/16/2010  . DIZZINESS 11/16/2010  . WEIGHT GAIN 11/16/2010  . HERPETIC WHITLOW 06/30/2010  . HYPERGLYCEMIA 05/07/2010  . DIASTOLIC DYSFUNCTION 19/62/2297  . DYSPNEA 02/17/2010  . ABSCESS, SKIN 12/22/2009  . SKIN RASH 12/22/2009  . HYPERLIPIDEMIA 12/15/2009  . POLYCYTHEMIA 11/24/2009  . Essential hypertension, benign 11/24/2009  . PSORIASIS 11/24/2009  . Depressive disorder, not elsewhere classified 09/13/2009  . ABSCESS, LEG 09/13/2009  . SPRAIN/STRAIN, ANKLE NOS 01/21/2009  . Adiposity 07/08/2008  . CAFL (chronic airflow  limitation) 07/08/2008  . Essential (primary) hypertension 07/08/2008   Current Outpatient Prescriptions on File Prior to Visit  Medication Sig Dispense Refill  . aspirin EC 81 MG tablet Take 81 mg by mouth daily.    . B-D INS SYR ULTRAFINE 1CC/31G 31G X 5/16" 1 ML MISC     . cephALEXin (KEFLEX) 500 MG capsule Take 1 capsule (500 mg total) by mouth 3 (three) times daily. 21 capsule 0  . cetaphil (CETAPHIL) lotion Apply topically daily. 236 mL 0  . dicyclomine (BENTYL) 10 MG capsule Take 10 mg by mouth 4 (four) times daily -  before meals and at bedtime.    . diphenhydrAMINE (BENADRYL) 25 mg capsule Take 25 mg by mouth every 6 (six) hours as needed.    Marland Kitchen escitalopram (LEXAPRO) 5 MG tablet Take 1 tablet (5 mg total) by mouth daily. 30 tablet 1  . gabapentin (NEURONTIN) 100 MG capsule Take 2 tablets at bedtime as needed for pain. Can increase to 3 tablets if pain persists. 90 capsule 2  . glucose blood (BAYER CONTOUR NEXT TEST) test strip Use to test blood sugar 3 times daily as instructed. Dx code: 250.62 100 each 11  . hydrochlorothiazide (MICROZIDE) 12.5 MG capsule Take 1 capsule (12.5 mg total) by mouth every other day. 90 capsule 0  . HYDROcodone-acetaminophen (NORCO/VICODIN) 5-325 MG per tablet Take 2 tablets by mouth every 4 (four) hours as needed for moderate pain.    . hydrocortisone 2.5 % cream Apply 1 application topically 2 (two) times daily.    . hydroxyurea (HYDREA) 500 MG capsule Take 3 capsules (1,500 mg total) by mouth daily. May take with food to minimize GI side effects. 90 capsule 0  . ibuprofen (ADVIL,MOTRIN) 200 MG tablet Take 200 mg by mouth every 6 (six) hours as needed for moderate pain.    Marland Kitchen insulin regular human CONCENTRATED (HUMULIN R) 500 UNIT/ML SOLN injection Inject under skin 100 units before breakfast and dinner 20 mL 2  . lisinopril (PRINIVIL,ZESTRIL) 40 MG tablet TAKE 1 TABLET (40 MG TOTAL) BY MOUTH DAILY. 90 tablet 1  . loperamide (IMODIUM A-D) 2 MG tablet Take 2  mg by mouth daily.    . meloxicam (MOBIC) 7.5 MG tablet Take 1 tablet (7.5 mg total) by mouth daily. (Patient not taking: Reported on 10/23/2014) 30 tablet 0  . metoprolol (LOPRESSOR) 100 MG tablet Take 1 tablet (100 mg total) by mouth 2 (two) times daily. 180 tablet 1  . nystatin cream (MYCOSTATIN) Apply 1 application topically 2 (two) times daily.    Marland Kitchen nystatin-triamcinolone ointment (MYCOLOG) Apply 1 application topically 2 (two) times daily. 30 g 1  . ondansetron (ZOFRAN) 8  MG tablet Take 8 mg by mouth every 8 (eight) hours as needed for nausea or vomiting.    Marland Kitchen oxyCODONE-acetaminophen (PERCOCET/ROXICET) 5-325 MG per tablet Take 1 tablet by mouth every 6 (six) hours as needed for severe pain. 15 tablet 0  . pravastatin (PRAVACHOL) 40 MG tablet Take 1 tablet (40 mg total) by mouth daily. 90 tablet 0  . Probiotic Product (PROBIOTIC DAILY PO) Take by mouth daily.    Marland Kitchen saccharomyces boulardii (FLORASTOR) 250 MG capsule Take 1 capsule (250 mg total) by mouth 2 (two) times daily. 60 capsule 1  . sulfamethoxazole-trimethoprim (BACTRIM DS,SEPTRA DS) 800-160 MG per tablet Take 1 tablet by mouth 2 (two) times daily. 14 tablet 0   No current facility-administered medications on file prior to visit.      Review of Systems See HPI    Objective:   Physical Exam  Physical Exam  Nursing note and vitals reviewed.  Constitutional: She is oriented to person, place, and time. She appears well-developed and well-nourished.  HENT:  Head: Normocephalic and atraumatic.  TM:  R reddened edematous drum  Left serous effusion Cardiovascular: Normal rate and regular rhythm. Exam reveals no gallop and no friction rub.  No murmur heard.  Pulmonary/Chest: Breath sounds normal. She has no wheezes. She has no rales.  Neurological: She is alert and oriented to person, place, and time.  Skin: Skin is warm and dry.  Scaly 3 mm rash  Left forearm.  Left breast  Maculopapular red rash with satellite lesions Trace  bilateral edema LE Psychiatric: She has a normal mood and affect. Her behavior is normal.             Assessment & Plan:  Right OM  Will give Rocephin 500 mg in office  Tinea R forearm  And left breast  Mycolog bid  Chronic Le edema  Elevated legs, continue diuretic.  Worsening after long train travel.   See me if not better

## 2014-12-12 NOTE — Telephone Encounter (Signed)
Amanda [name] called and said that the hydrocortisone cream RX was over 100.00. She can not afford to get it. Is there something else she can get called in?

## 2014-12-16 MED ORDER — KETOCONAZOLE 2 % EX CREA: TOPICAL_CREAM | Freq: Every day | CUTANEOUS | Status: DC

## 2014-12-18 NOTE — Telephone Encounter (Signed)
I left Syrianna a message in regards to her medications on 12/16/14 ans 12/17/14-eh

## 2014-12-21 ENCOUNTER — Encounter: Payer: Self-pay | Admitting: Gastroenterology

## 2014-12-23 ENCOUNTER — Ambulatory Visit (INDEPENDENT_AMBULATORY_CARE_PROVIDER_SITE_OTHER): Payer: Medicare Other | Admitting: Internal Medicine

## 2014-12-23 ENCOUNTER — Encounter: Payer: Self-pay | Admitting: Internal Medicine

## 2014-12-23 VITALS — BP 148/76 | HR 77 | Resp 16 | Ht 64.0 in | Wt 222.0 lb

## 2014-12-23 DIAGNOSIS — R609 Edema, unspecified: Secondary | ICD-10-CM

## 2014-12-23 DIAGNOSIS — H6691 Otitis media, unspecified, right ear: Secondary | ICD-10-CM | POA: Diagnosis not present

## 2014-12-23 DIAGNOSIS — I10 Essential (primary) hypertension: Secondary | ICD-10-CM

## 2014-12-23 NOTE — Progress Notes (Signed)
Subjective:    Patient ID: Amanda [name], female    DOB: 1941-03-12, 74 y.o.   MRN: [id number]  HPI 12/12/2014 note Assessment & Plan:  Right OM Will give Rocephin 500 mg in office  Tinea R forearm And left breast Mycolog bid  Chronic Le edema Elevated legs, continue diuretic. Worsening after long train travel.   See me if not better      10/23/2014 note    TODAY Amanda [name] is here for follow up of mulltiple issues  ROM   Improving  LE edema  No edema today  HTN:  She needs to fill her RX and did not take her BP meds today     Allergies  Allergen Reactions  . Tape Hives  . Doxycycline Hives, Swelling and Rash  . Latex Hives, Itching and Rash   Past Medical History  Diagnosis Date  . Hypertension   . Hyperlipidemia   . Diabetes mellitus 2008  . Depression   . Obesity   . Psoriasis   . Polycythemia     Dr. Elease Hashimoto- [facility]  . Herpes simplex   . Gout   . Hyperglycemia 05/31/2013  . C. difficile colitis   . Cirrhosis   . Cancer     SKIN  . Neuromuscular disorder     BELL PALSY  . Candidiasis of skin 09/30/2014   Past Surgical History  Procedure Laterality Date  . Breast surgery Left     milk duct  . Total abdominal hysterectomy w/ bilateral salpingoophorectomy      for heavy periods with appendectomy  . Tonsillectomy    . Colonoscopy     History   Social History  . Marital Status: Married    Spouse Name: N/A  . Number of Children: 2  . Years of Education: N/A   Occupational History  . housewife    Social History Main Topics  . Smoking status: Former Smoker -- 48 years    Types: Cigarettes    Quit date: 10/18/1986  . Smokeless tobacco: Never Used  . Alcohol Use: No  . Drug Use: No  . Sexual Activity: Yes    Birth Control/ Protection: None   Other Topics Concern  . Not on file   Social History Narrative   Family History  Problem Relation Age of Onset  . Diabetes Mother   . Heart disease Mother     CAD  .  Hyperlipidemia Mother   . Hypertension Mother   . Kidney disease Mother   . Kidney disease Father   . Breast cancer Maternal Aunt   . Heart disease Maternal Grandmother   . Heart disease Other     maternal aunts and uncles   Patient Active Problem List   Diagnosis Date Noted  . Candidiasis of skin 09/30/2014  . Thrombocytosis 07/04/2014  . Preventive measure 07/04/2014  . Cholelithiasis 02/07/2014  . Clostridium difficile colitis 02/05/2014  . UTI (lower urinary tract infection) 01/29/2014  . Dizziness 01/29/2014  . Diarrhea 01/29/2014  . Dehydration 01/29/2014  . Candidiasis of genitalia in female 11/19/2013  . Cellulitis and abscess of buttock, left 11/01/2013  . Edema 07/09/2013  . Constipation 07/09/2013  . Type 2 diabetes mellitus with neurological manifestations, uncontrolled 06/07/2013  . Leukocytosis 06/02/2013  . Bacteria in urine 06/01/2013  . Hyperglycemia 05/31/2013  . [name] 05/31/2013  . HTN (hypertension) 05/31/2013  . History of Bell's palsy 05/26/2013  . DERMATITIS, ATOPIC 11/16/2010  . DIZZINESS 11/16/2010  . WEIGHT GAIN  11/16/2010  . HERPETIC WHITLOW 06/30/2010  . HYPERGLYCEMIA 05/07/2010  . DIASTOLIC DYSFUNCTION 93/57/0177  . DYSPNEA 02/17/2010  . ABSCESS, SKIN 12/22/2009  . SKIN RASH 12/22/2009  . HYPERLIPIDEMIA 12/15/2009  . POLYCYTHEMIA 11/24/2009  . Essential hypertension, benign 11/24/2009  . PSORIASIS 11/24/2009  . Depressive disorder, not elsewhere classified 09/13/2009  . ABSCESS, LEG 09/13/2009  . SPRAIN/STRAIN, ANKLE NOS 01/21/2009  . Adiposity 07/08/2008  . CAFL (chronic airflow limitation) 07/08/2008  . Essential (primary) hypertension 07/08/2008   Current Outpatient Prescriptions on File Prior to Visit  Medication Sig Dispense Refill  . aspirin EC 81 MG tablet Take 81 mg by mouth daily.    . B-D INS SYR ULTRAFINE 1CC/31G 31G X 5/16" 1 ML MISC     . cetaphil (CETAPHIL) lotion Apply topically daily. 236 mL 0  .  dicyclomine (BENTYL) 10 MG capsule Take 10 mg by mouth 4 (four) times daily -  before meals and at bedtime.    . diphenhydrAMINE (BENADRYL) 25 mg capsule Take 25 mg by mouth every 6 (six) hours as needed.    Marland Kitchen escitalopram (LEXAPRO) 5 MG tablet Take 1 tablet (5 mg total) by mouth daily. 30 tablet 1  . gabapentin (NEURONTIN) 100 MG capsule Take 2 tablets at bedtime as needed for pain. Can increase to 3 tablets if pain persists. 90 capsule 2  . glucose blood (BAYER CONTOUR NEXT TEST) test strip Use to test blood sugar 3 times daily as instructed. Dx code: 250.62 100 each 11  . hydrochlorothiazide (MICROZIDE) 12.5 MG capsule Take 1 capsule (12.5 mg total) by mouth every other day. 90 capsule 0  . hydrocortisone 2.5 % cream Apply 1 application topically 2 (two) times daily.    . hydroxyurea (HYDREA) 500 MG capsule Take 3 capsules (1,500 mg total) by mouth daily. May take with food to minimize GI side effects. 90 capsule 0  . ibuprofen (ADVIL,MOTRIN) 200 MG tablet Take 200 mg by mouth every 6 (six) hours as needed for moderate pain.    Marland Kitchen insulin regular human CONCENTRATED (HUMULIN R) 500 UNIT/ML SOLN injection Inject under skin 100 units before breakfast and dinner 20 mL 2  . ketoconazole (NIZORAL) 2 % cream Apply topically daily. Apply to rash daily for 10 days 15 g 1  . lisinopril (PRINIVIL,ZESTRIL) 40 MG tablet TAKE 1 TABLET (40 MG TOTAL) BY MOUTH DAILY. 90 tablet 1  . loperamide (IMODIUM A-D) 2 MG tablet Take 2 mg by mouth daily.    . meloxicam (MOBIC) 7.5 MG tablet Take 1 tablet (7.5 mg total) by mouth daily. (Patient not taking: Reported on 10/23/2014) 30 tablet 0  . metoprolol (LOPRESSOR) 100 MG tablet Take 1 tablet (100 mg total) by mouth 2 (two) times daily. 180 tablet 1  . nystatin cream (MYCOSTATIN) Apply 1 application topically 2 (two) times daily.    Marland Kitchen nystatin-triamcinolone ointment (MYCOLOG) Apply 1 application topically 2 (two) times daily. 30 g 1  . ondansetron (ZOFRAN) 8 MG tablet Take 8  mg by mouth every 8 (eight) hours as needed for nausea or vomiting.    . pravastatin (PRAVACHOL) 40 MG tablet Take 1 tablet (40 mg total) by mouth daily. 90 tablet 0  . Probiotic Product (PROBIOTIC DAILY PO) Take by mouth daily.    Marland Kitchen saccharomyces boulardii (FLORASTOR) 250 MG capsule Take 1 capsule (250 mg total) by mouth 2 (two) times daily. 60 capsule 1   No current facility-administered medications on file prior to visit.  Review of Systems See HPI    Objective:   Physical Exam  Physical Exam  Nursing note and vitals reviewed.  Constitutional: She is oriented to person, place, and time. She appears well-developed and well-nourished.  HENT:  Head: Normocephalic and atraumatic.  TM's  Serous effusions bilaterally minimal edema Cardiovascular: Normal rate and regular rhythm. Exam reveals no gallop and no friction rub.  No murmur heard.  Pulmonary/Chest: Breath sounds normal. She has no wheezes. She has no rales.  Neurological: She is alert and oriented to person, place, and time.  Skin: Skin is warm and dry.  Ext  No edema  Psychiatric: She has a normal mood and affect. Her behavior is normal.             Assessment & Plan:  HTN   Repeat BP improved  Right OM  Improved  LE edema   improved  DM  Managed by endocrinology   See me in 3 months or prn  PV

## 2014-12-30 ENCOUNTER — Other Ambulatory Visit: Payer: Medicare HMO

## 2014-12-30 ENCOUNTER — Ambulatory Visit: Payer: Medicare HMO | Admitting: Hematology and Oncology

## 2014-12-30 DIAGNOSIS — E1151 Type 2 diabetes mellitus with diabetic peripheral angiopathy without gangrene: Secondary | ICD-10-CM | POA: Diagnosis not present

## 2014-12-30 DIAGNOSIS — M79672 Pain in left foot: Secondary | ICD-10-CM | POA: Diagnosis not present

## 2014-12-30 DIAGNOSIS — M65872 Other synovitis and tenosynovitis, left ankle and foot: Secondary | ICD-10-CM | POA: Diagnosis not present

## 2014-12-31 ENCOUNTER — Telehealth: Payer: Self-pay | Admitting: Hematology and Oncology

## 2014-12-31 NOTE — Telephone Encounter (Signed)
Pt came in missed apt 03/14 r/s and confirmed labs/ov/phlebotomy .Marland Kitchen... KJ

## 2015-01-03 ENCOUNTER — Encounter: Payer: Medicare Other | Admitting: Sports Medicine

## 2015-01-06 DIAGNOSIS — E1151 Type 2 diabetes mellitus with diabetic peripheral angiopathy without gangrene: Secondary | ICD-10-CM | POA: Diagnosis not present

## 2015-01-06 DIAGNOSIS — M65872 Other synovitis and tenosynovitis, left ankle and foot: Secondary | ICD-10-CM | POA: Diagnosis not present

## 2015-01-06 DIAGNOSIS — M79672 Pain in left foot: Secondary | ICD-10-CM | POA: Diagnosis not present

## 2015-01-15 DIAGNOSIS — I890 Lymphedema, not elsewhere classified: Secondary | ICD-10-CM | POA: Diagnosis not present

## 2015-01-20 ENCOUNTER — Telehealth: Payer: Self-pay | Admitting: *Deleted

## 2015-01-20 ENCOUNTER — Telehealth: Payer: Self-pay | Admitting: Internal Medicine

## 2015-01-20 NOTE — Telephone Encounter (Signed)
Johanne's husband called and said that Amanda [name]'s feet are really painful. He gave her a Tramadol he had. He is requesting a pain medication be called in for her. I told advised him that you did not call in pain meds. He insisted that I still request it

## 2015-01-20 NOTE — Telephone Encounter (Signed)
Pt needs pain medication for feet she had stabbing pains , apt has been made with [facility] for 01/21/15 @ 1300 possible that Cruzita Lederer may have to cancel apt  Call husband at [phone]
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
## 2015-01-21 ENCOUNTER — Telehealth: Payer: Self-pay | Admitting: Internal Medicine

## 2015-01-21 ENCOUNTER — Ambulatory Visit: Payer: Medicare Other | Admitting: Internal Medicine

## 2015-01-21 MED ORDER — ACETAMINOPHEN-CODEINE #3 300-30 MG PO TABS: ORAL_TABLET | ORAL | Status: DC

## 2015-01-21 NOTE — Telephone Encounter (Signed)
Spoke with pts husband  Who reports pt having pain in both feet.  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  Will give T#3 short course until her visit   Margaretha Sheffield call pt and let her know I have prescribed Tylenol with codeine until she sees Dr. Cruzita Lederer on Thursday  OK to call in med as ordered

## 2015-01-21 NOTE — Telephone Encounter (Signed)
If the pain is only during the night she can increase the dose to 4 capsules. If she is having pain during the day she can take 2-3 capsules 3 times a day and asked prescription can be for 300 mg 4 times a day

## 2015-01-21 NOTE — Telephone Encounter (Signed)
Please read message below and advise. Pt just started back on the gabapentin. She is taking 2 a day (172m). Pt states the pain and the burning are terrible. Please advise in Dr GArman Filterabsence what to do.

## 2015-01-21 NOTE — Telephone Encounter (Signed)
RX called into [facility]. -eh

## 2015-01-21 NOTE — Telephone Encounter (Signed)
Called pt and advised her per Dr Ronnie Derby message. Pt stated it's worse during the day. Pt will start 300 mg 3 times a day. Pt will call and let us know how she is doing by the end of the week.

## 2015-01-21 NOTE — Telephone Encounter (Signed)
Patient ask if you could send something to the pharmacy for the pain in her feet??

## 2015-01-23 ENCOUNTER — Encounter: Payer: Self-pay | Admitting: Internal Medicine

## 2015-01-23 ENCOUNTER — Telehealth: Payer: Self-pay | Admitting: *Deleted

## 2015-01-23 ENCOUNTER — Ambulatory Visit (INDEPENDENT_AMBULATORY_CARE_PROVIDER_SITE_OTHER): Payer: Medicare Other | Admitting: Internal Medicine

## 2015-01-23 VITALS — BP 112/70 | HR 94 | Temp 97.8°F | Resp 12 | Wt 222.0 lb

## 2015-01-23 DIAGNOSIS — E1142 Type 2 diabetes mellitus with diabetic polyneuropathy: Secondary | ICD-10-CM | POA: Diagnosis not present

## 2015-01-23 DIAGNOSIS — IMO0002 Reserved for concepts with insufficient information to code with codable children: Secondary | ICD-10-CM

## 2015-01-23 DIAGNOSIS — E1141 Type 2 diabetes mellitus with diabetic mononeuropathy: Secondary | ICD-10-CM | POA: Diagnosis not present

## 2015-01-23 DIAGNOSIS — E1165 Type 2 diabetes mellitus with hyperglycemia: Principal | ICD-10-CM

## 2015-01-23 DIAGNOSIS — G629 Polyneuropathy, unspecified: Secondary | ICD-10-CM

## 2015-01-23 DIAGNOSIS — E1149 Type 2 diabetes mellitus with other diabetic neurological complication: Secondary | ICD-10-CM

## 2015-01-23 MED ORDER — GABAPENTIN 100 MG PO CAPS: ORAL_CAPSULE | ORAL | Status: DC

## 2015-01-23 MED ORDER — NONFORMULARY OR COMPOUNDED ITEM: Status: DC

## 2015-01-23 NOTE — Telephone Encounter (Signed)
Alanea's husband called and said that Amanda [name] seen Dr. Cruzita Lederer today about the pain in her foot. Dr, Cruzita Lederer is requesting that she be chnaged to Cymbalta if this is okay with you

## 2015-01-23 NOTE — Telephone Encounter (Signed)
Let her husband know that this is fine with me but Dr. Cruzita Lederer will have to moniter this and make any refills of this medication

## 2015-01-23 NOTE — Patient Instructions (Addendum)
Please continue U500 insulin 20 units before b'fast and before dinner.  Please increase the Neurontin to 600 mg 3x a day, but start increasing slowly - one dose a day every 2-3 days.  Please try to get the neuropathy cream that I suggested.  Let me know how the pain is in a week.  Please stop at the lab.  Please return in 1-1.5 months with your sugar log.   Marland Kitchen

## 2015-01-23 NOTE — Progress Notes (Signed)
Patient ID: Amanda [name], female   DOB: Sep 05, 1941, 74 y.o.   MRN: [id number]  HPI: Amanda [name] is a 74 y.o.-year-old female, returning for f/u for DM2, dx 2008, insulin-dependent since dx, uncontrolled, with complications (DR, PN). Last visit 6 mo ago!  In the last 3 months >> she had 2 deaths in her family, one of them being her daughter-in-law.  She is here also for peripheral neuropathy, which is worse >> saw PCP, podiatrist, orthopedic >> tried capsaicin cream, compression stockings, then Neurontin >> increased to 300 mg 3x a day. This helped a little but still having significant pain.  Last hemoglobin A1c was: 05/16/2014: HbA1c from fructosamine: 7.95%. Lab Results  Component Value Date   HGBA1C 9.8* 05/16/2014   HGBA1C 13.5* 01/31/2014   HGBA1C 12.2* 05/31/2013  - in 10/16/2012, hemoglobin A1c was 6.9% - in 07/2012, hemoglobin A1c was 9.9% - in 11/16/2010, hemoglobin A1c was 12.8% - in 03/2011, hemoglobin A1c was 6.9% - In 07/10/2010 her hemoglobin A1c was 10.5% C-peptide was 6.3 and 01/2011.  Pt was on a regimen of: - Levemir 45 units 2x a day, in am and at bedtime - Humalog mealtime: 40 units with a smaller meal 45 units with a larger meal  - Sliding Scale of Humalog:  - 150- 165: + 1 unit  - 166- 180: + 2 units  - 181- 195: + 3 units  - 196- 210: + 4 units  - 211- 225: + 5 units  - 226- 240: + 6 units  - >240: + 7 units  She could not tolerate doses of metformin XR higher than 250 mg twice a day in the past due to diarrhea. She does not want to resume this. Was previously on the Victoza 1.8 mg daily - "made me sick". She was previously on Janumet between 2012-2013. She was on Invokana - but could not afford it while in the Medicare doughnut hole.  We switched to U500: - 15 units - on the syringe (0.15 mL) before breakfast >> 20 units - 18 units - on the syringe (0.18 mL) before dinner >> 20 units  Pt checks her sugars 2-3x a day - no sugars checked as she  traveled a lot in the last month -we reviewed the sugars from last time : - am: 190-255 >> 209-347 >> same >> 90-211, 235 - 2h after b'fast: n/c >> 238 - before lunch: 140, 223, 247 >> 161-319 >> 258-290 >> 167-343 >> 175-340 >> 238 - 2h after lunch: 240-312 >> n/c >> 270-456 >> 112-218 - before dinner: 150-330 >> 150-170 >> 90 x1, 315 >> 175-336 >> 147, 273 - bedtime: 152-306 >> n/c >> 161-326 >> 164, 240, 240, 268 >> 273 No lows. Lowest sugar was 90x1; she has hypoglycemia awareness at 100. Highest sugar was 450s >> 280s.  Pt's meals are - reviewed per last visit: - Breakfast: ("I am not a breakfast eater") 1 egg + toast; 3 pancakes + sugarfree syrup; waffle; french toast - not skipping anymore  - Lunch: sometimes skips, sometimes eats out: pizza, salad, no dessert - Dinner: at 5:30-6pm: soup, meat + vegetables + starch, dessert - Snacks: 8 pm: fruit, sugar free  She was seen for nutritional advice and diabetes education, with last visit on 04/2012 - [name].  - no CKD, last BUN/creatinine:  Lab Results  Component Value Date   BUN 14 03/29/2014   CREATININE 0.6 03/29/2014  She is on ACEI. Last microalbumin to creatinine ratio  was 19.2 in 09/2012. - last set of lipids: Lab Results  Component Value Date   CHOL 167 06/01/2013   HDL 21* 06/01/2013   LDLCALC 91 06/01/2013   TRIG 275* 06/01/2013   CHOLHDL 8.0 06/01/2013  She is on Pravastatin. - last eye exam was in 07/2013. 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 At last eye exam (Dr Zigmund Daniel): cataracts. - no numbness and tingling in her feet. She is on aspirin 81 mg daily.  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  ROS: Constitutional: no weight gain/loss, no fatigue Eyes: no blurry vision, no xerophthalmia ENT: no sore throat, no  nodules palpated in throat, no dysphagia/odynophagia, no hoarseness Cardiovascular: no CP/SOB/palpitations/ +leg swelling Respiratory:no  cough/no SOB Gastrointestinal: no N/V/D/no C Musculoskeletal: no muscle/joint aches Skin: No rashes Neurological: no tremors/numbness/tingling/dizziness  I reviewed pt's medications, allergies, PMH, social hx, family hx, and changes were documented in the history of present illness. Otherwise, unchanged from my initial visit note.  PE: BP 112/70 mmHg  Pulse 94  Temp(Src) 97.8 F (36.6 C) (Oral)  Resp 12  Wt 222 lb (100.699 kg)  SpO2 95% Body mass index is 38.09 kg/(m^2). Wt Readings from Last 3 Encounters:  01/23/15 222 lb (100.699 kg)  12/23/14 222 lb (100.699 kg)  12/12/14 221 lb (100.245 kg)   Constitutional: overweight, in NAD Eyes: PERRLA, EOMI, no exophthalmos ENT: moist mucous membranes, no thyromegaly, no cervical lymphadenopathy Cardiovascular: RRR, No MRG, + leg swelling Respiratory: CTA B Gastrointestinal: abdomen soft, NT, ND, BS+ Musculoskeletal: no deformities, strength intact in all 4 Skin: moist, warm  ASSESSMENT: 1. DM2, insulin-dependent, uncontrolled, with complications - nonprolif. DR - peripheral neuropathy  2. Diabetic peripheral neuropathy - She has an exacerbation  PLAN:  1. Patient with long-standing, uncontrolled diabetes, with medication noncompliance due to finances and forgetting to take the insulin doses. She is now doing much better on U500, but she does not bring any sugars today. I advised her to start checking and writing them down and bring them at next visit. For now we'll continue the same doses of U500 insulin as before: Patient Instructions  Please continue U500 insulin 20 units before b'fast and before dinner.  Please increase the Neurontin to 600 mg 3x a day, but start increasing slowly - one dose a day every 2-3 days.  Please try to get the neuropathy cream that I suggested.  Let me know how  the pain is in a week.  Please stop at the lab.  Please return in 1-1.5 months with your sugar log.   - advised her to continue checking her sugars at different times of the day - check 3 times a day, rotating checks - needs a new eye exam (Dr. Zigmund Daniel)  >> advised to schedule - she has PVera >> need to check fructosamine instead of Hba1c - Return to clinic in 1-1.5 month with sugar log  2. Diabetic peripheral neuropathy - She has an exacerbation, and has persistent increased pain despite increasing the Neurontin to 300 mg 3 times a day - She has swelling in her legs and wears on and off compression hoses and tries to keep her legs elevated - I advised her to increase the Neurontin to 600 mg 3 times a day, however to only start by using one 600 mg dose a day - I also advised her  to use the following neuropathy cream, for which I printed a prescription for them: Neuropathy cream: mix Amitriptyline 2%, Lidocaine 2%, and Ketamine 1% - apply on feet 2x a day - If the above does not work, I advised her to discuss with PCP to change from Lexapro to Cymbalta which will also help her neuropathy - Another option is to switch from Neurontin to Lyrica, but I'm not sure whether this would be approved by her insurance  - Patient agrees with the plan  Fructosamine: Office Visit on 01/23/2015  Component Date Value Ref Range Status  . Fructosamine 01/23/2015 292* 190 - 270 umol/L Final   HbA1c calculated from fructosamine: 6.57%!

## 2015-01-24 ENCOUNTER — Other Ambulatory Visit: Payer: Self-pay | Admitting: Internal Medicine

## 2015-01-24 ENCOUNTER — Telehealth: Payer: Self-pay | Admitting: Internal Medicine

## 2015-01-24 NOTE — Telephone Encounter (Signed)
I spoke with patient in regards to the Cymbalta. Patient understood that Dr. Cruzita Lederer needs to write this [place]

## 2015-01-24 NOTE — Telephone Encounter (Signed)
Called pt's husband, he could not remember the name of the medication that Dr Cruzita Lederer suggested switching to from [place]. I advised him. He stated that he called Dr Sharin Mons office and she said that Dr Cruzita Lederer needs to be the one to switch the med. He also suggested that if Dr Cruzita Lederer does switch it to please send in an rx to [facility]. He wants to get the best price (plus get her started on the medication while waiting on the 90 day). Please advise.

## 2015-01-24 NOTE — Telephone Encounter (Signed)
Let's wait and see if the cream and the higher dose of Neurontin helped first and then will decide for Cymbalta. Please asked him to let me know in about a week how her pain is doing

## 2015-01-24 NOTE — Telephone Encounter (Signed)
Husband calling about the rx that is going to be changed he doesn't know the name of it.

## 2015-01-24 NOTE — Telephone Encounter (Signed)
Called pt's husband and advised him per Dr Arman Filter message. He voiced understanding and will let us know how she is doing next week.

## 2015-01-27 ENCOUNTER — Other Ambulatory Visit: Payer: Self-pay

## 2015-01-27 ENCOUNTER — Telehealth: Payer: Self-pay | Admitting: Hematology and Oncology

## 2015-01-27 ENCOUNTER — Telehealth: Payer: Self-pay | Admitting: Internal Medicine

## 2015-01-27 ENCOUNTER — Encounter (HOSPITAL_COMMUNITY): Payer: Self-pay | Admitting: Cardiovascular Disease

## 2015-01-27 ENCOUNTER — Inpatient Hospital Stay (HOSPITAL_COMMUNITY)
Admission: AD | Admit: 2015-01-27 | Discharge: 2015-02-04 | DRG: 246 | Disposition: A | Discharge status: Skilled Nursing Facility | Payer: Medicare Other | Source: Ambulatory Visit | Attending: Cardiovascular Disease | Admitting: Cardiovascular Disease

## 2015-01-27 ENCOUNTER — Encounter (HOSPITAL_COMMUNITY)
Admission: AD | Disposition: A | Discharge status: Skilled Nursing Facility | Payer: Medicare Other | Source: Ambulatory Visit | Attending: Cardiovascular Disease

## 2015-01-27 DIAGNOSIS — Z7982 Long term (current) use of aspirin: Secondary | ICD-10-CM | POA: Diagnosis not present

## 2015-01-27 DIAGNOSIS — T508X5A Adverse effect of diagnostic agents, initial encounter: Secondary | ICD-10-CM | POA: Diagnosis not present

## 2015-01-27 DIAGNOSIS — Z7902 Long term (current) use of antithrombotics/antiplatelets: Secondary | ICD-10-CM | POA: Diagnosis not present

## 2015-01-27 DIAGNOSIS — I2121 ST elevation (STEMI) myocardial infarction involving left circumflex coronary artery: Secondary | ICD-10-CM | POA: Diagnosis not present

## 2015-01-27 DIAGNOSIS — E669 Obesity, unspecified: Secondary | ICD-10-CM | POA: Diagnosis not present

## 2015-01-27 DIAGNOSIS — R0602 Shortness of breath: Secondary | ICD-10-CM | POA: Diagnosis not present

## 2015-01-27 DIAGNOSIS — Z87891 Personal history of nicotine dependence: Secondary | ICD-10-CM | POA: Diagnosis not present

## 2015-01-27 DIAGNOSIS — I370 Nonrheumatic pulmonary valve stenosis: Secondary | ICD-10-CM | POA: Diagnosis not present

## 2015-01-27 DIAGNOSIS — N179 Acute kidney failure, unspecified: Secondary | ICD-10-CM | POA: Diagnosis not present

## 2015-01-27 DIAGNOSIS — E782 Mixed hyperlipidemia: Secondary | ICD-10-CM | POA: Diagnosis not present

## 2015-01-27 DIAGNOSIS — Z833 Family history of diabetes mellitus: Secondary | ICD-10-CM | POA: Diagnosis not present

## 2015-01-27 DIAGNOSIS — Z955 Presence of coronary angioplasty implant and graft: Secondary | ICD-10-CM

## 2015-01-27 DIAGNOSIS — E1165 Type 2 diabetes mellitus with hyperglycemia: Secondary | ICD-10-CM | POA: Diagnosis present

## 2015-01-27 DIAGNOSIS — I442 Atrioventricular block, complete: Secondary | ICD-10-CM | POA: Diagnosis present

## 2015-01-27 DIAGNOSIS — K7460 Unspecified cirrhosis of liver: Secondary | ICD-10-CM | POA: Diagnosis present

## 2015-01-27 DIAGNOSIS — E1142 Type 2 diabetes mellitus with diabetic polyneuropathy: Secondary | ICD-10-CM | POA: Diagnosis not present

## 2015-01-27 DIAGNOSIS — E785 Hyperlipidemia, unspecified: Secondary | ICD-10-CM | POA: Diagnosis not present

## 2015-01-27 DIAGNOSIS — E1149 Type 2 diabetes mellitus with other diabetic neurological complication: Secondary | ICD-10-CM | POA: Diagnosis present

## 2015-01-27 DIAGNOSIS — Z794 Long term (current) use of insulin: Secondary | ICD-10-CM | POA: Diagnosis not present

## 2015-01-27 DIAGNOSIS — I5032 Chronic diastolic (congestive) heart failure: Secondary | ICD-10-CM

## 2015-01-27 DIAGNOSIS — E11649 Type 2 diabetes mellitus with hypoglycemia without coma: Secondary | ICD-10-CM | POA: Diagnosis not present

## 2015-01-27 DIAGNOSIS — M109 Gout, unspecified: Secondary | ICD-10-CM | POA: Diagnosis present

## 2015-01-27 DIAGNOSIS — F419 Anxiety disorder, unspecified: Secondary | ICD-10-CM | POA: Diagnosis present

## 2015-01-27 DIAGNOSIS — I2119 ST elevation (STEMI) myocardial infarction involving other coronary artery of inferior wall: Secondary | ICD-10-CM | POA: Insufficient documentation

## 2015-01-27 DIAGNOSIS — I5031 Acute diastolic (congestive) heart failure: Secondary | ICD-10-CM | POA: Diagnosis present

## 2015-01-27 DIAGNOSIS — Z8249 Family history of ischemic heart disease and other diseases of the circulatory system: Secondary | ICD-10-CM

## 2015-01-27 DIAGNOSIS — M6281 Muscle weakness (generalized): Secondary | ICD-10-CM | POA: Diagnosis not present

## 2015-01-27 DIAGNOSIS — D751 Secondary polycythemia: Secondary | ICD-10-CM | POA: Diagnosis not present

## 2015-01-27 DIAGNOSIS — E1141 Type 2 diabetes mellitus with diabetic mononeuropathy: Secondary | ICD-10-CM | POA: Diagnosis not present

## 2015-01-27 DIAGNOSIS — R079 Chest pain, unspecified: Secondary | ICD-10-CM | POA: Diagnosis not present

## 2015-01-27 DIAGNOSIS — Z79899 Other long term (current) drug therapy: Secondary | ICD-10-CM

## 2015-01-27 DIAGNOSIS — I25110 Atherosclerotic heart disease of native coronary artery with unstable angina pectoris: Secondary | ICD-10-CM | POA: Diagnosis not present

## 2015-01-27 DIAGNOSIS — Z6837 Body mass index (BMI) 37.0-37.9, adult: Secondary | ICD-10-CM | POA: Diagnosis not present

## 2015-01-27 DIAGNOSIS — N141 Nephropathy induced by other drugs, medicaments and biological substances: Secondary | ICD-10-CM | POA: Diagnosis not present

## 2015-01-27 DIAGNOSIS — R001 Bradycardia, unspecified: Secondary | ICD-10-CM | POA: Diagnosis not present

## 2015-01-27 DIAGNOSIS — I2510 Atherosclerotic heart disease of native coronary artery without angina pectoris: Secondary | ICD-10-CM | POA: Diagnosis present

## 2015-01-27 DIAGNOSIS — IMO0001 Reserved for inherently not codable concepts without codable children: Secondary | ICD-10-CM

## 2015-01-27 DIAGNOSIS — G629 Polyneuropathy, unspecified: Secondary | ICD-10-CM

## 2015-01-27 DIAGNOSIS — I255 Ischemic cardiomyopathy: Secondary | ICD-10-CM | POA: Diagnosis present

## 2015-01-27 DIAGNOSIS — R2681 Unsteadiness on feet: Secondary | ICD-10-CM | POA: Diagnosis not present

## 2015-01-27 DIAGNOSIS — R262 Difficulty in walking, not elsewhere classified: Secondary | ICD-10-CM | POA: Diagnosis not present

## 2015-01-27 DIAGNOSIS — I10 Essential (primary) hypertension: Secondary | ICD-10-CM | POA: Diagnosis not present

## 2015-01-27 HISTORY — PX: LEFT HEART CATHETERIZATION WITH CORONARY ANGIOGRAM: SHX5451

## 2015-01-27 LAB — POCT I-STAT, CHEM 8
BUN: 15 mg/dL (ref 6–23)
CALCIUM ION: 1.3 mmol/L (ref 1.13–1.30)
CHLORIDE: 102 mmol/L (ref 96–112)
Creatinine, Ser: 2.2 mg/dL — ABNORMAL HIGH (ref 0.50–1.10)
GLUCOSE: 166 mg/dL — AB (ref 70–99)
HEMATOCRIT: 49 % — AB (ref 36.0–46.0)
HEMOGLOBIN: 16.7 g/dL — AB (ref 12.0–15.0)
Potassium: 4.1 mmol/L (ref 3.5–5.1)
Sodium: 141 mmol/L (ref 135–145)
TCO2: 23 mmol/L (ref 0–100)

## 2015-01-27 LAB — COMPREHENSIVE METABOLIC PANEL
ALBUMIN: 3.5 g/dL (ref 3.5–5.2)
ALK PHOS: 80 U/L (ref 39–117)
ALT: 18 U/L (ref 0–35)
AST: 68 U/L — AB (ref 0–37)
Anion gap: 9 (ref 5–15)
BUN: 14 mg/dL (ref 6–23)
CO2: 25 mmol/L (ref 19–32)
Calcium: 8.9 mg/dL (ref 8.4–10.5)
Chloride: 104 mmol/L (ref 96–112)
Creatinine, Ser: 0.85 mg/dL (ref 0.50–1.10)
GFR calc non Af Amer: 65 mL/min — ABNORMAL LOW (ref >90)
GFR, EST AFRICAN AMERICAN: 76 mL/min — AB (ref >90)
Glucose, Bld: 155 mg/dL — ABNORMAL HIGH (ref 70–99)
POTASSIUM: 4 mmol/L (ref 3.5–5.1)
Sodium: 138 mmol/L (ref 135–145)
TOTAL PROTEIN: 6.2 g/dL (ref 6.0–8.3)
Total Bilirubin: 1.2 mg/dL (ref 0.3–1.2)

## 2015-01-27 LAB — CBC
HCT: 45.2 % (ref 36.0–46.0)
Hemoglobin: 15.3 g/dL — ABNORMAL HIGH (ref 12.0–15.0)
MCH: 33.6 pg (ref 26.0–34.0)
MCHC: 33.8 g/dL (ref 30.0–36.0)
MCV: 99.1 fL (ref 78.0–100.0)
PLATELETS: 877 10*3/uL — AB (ref 150–400)
RBC: 4.56 MIL/uL (ref 3.87–5.11)
RDW: 13.7 % (ref 11.5–15.5)
WBC: 24.3 10*3/uL — ABNORMAL HIGH (ref 4.0–10.5)

## 2015-01-27 LAB — GLUCOSE, CAPILLARY
Glucose-Capillary: 105 mg/dL — ABNORMAL HIGH (ref 70–99)
Glucose-Capillary: 106 mg/dL — ABNORMAL HIGH (ref 70–99)

## 2015-01-27 LAB — POCT ACTIVATED CLOTTING TIME
ACTIVATED CLOTTING TIME: 552 s
Activated Clotting Time: 976 seconds
Activated Clotting Time: 189 seconds
Activated Clotting Time: 165 seconds

## 2015-01-27 LAB — CK TOTAL AND CKMB (NOT AT ARMC)
CK TOTAL: 728 U/L — AB (ref 7–177)
CK, MB: 101.3 ng/mL — AB (ref 0.3–4.0)
RELATIVE INDEX: 13.9 — AB (ref 0.0–2.5)

## 2015-01-27 LAB — LIPID PANEL
CHOL/HDL RATIO: 6.2 ratio
CHOLESTEROL: 168 mg/dL (ref 0–200)
HDL: 27 mg/dL — ABNORMAL LOW (ref >39)
LDL CALC: 88 mg/dL (ref 0–99)
Triglycerides: 266 mg/dL — ABNORMAL HIGH (ref <150)
VLDL: 53 mg/dL — AB (ref 0–40)

## 2015-01-27 LAB — TROPONIN I
TROPONIN I: 3.64 ng/mL — AB (ref <0.031)
Troponin I: 56.44 ng/mL (ref <0.031)

## 2015-01-27 LAB — PROTIME-INR
INR: 1.19 (ref 0.00–1.49)
Prothrombin Time: 15.2 seconds (ref 11.6–15.2)

## 2015-01-27 LAB — FRUCTOSAMINE: FRUCTOSAMINE: 292 umol/L — AB (ref 190–270)

## 2015-01-27 LAB — MRSA PCR SCREENING: MRSA BY PCR: POSITIVE — AB

## 2015-01-27 LAB — APTT: APTT: 42 s — AB (ref 24–37)

## 2015-01-27 SURGERY — LEFT HEART CATHETERIZATION WITH CORONARY ANGIOGRAM
Anesthesia: LOCAL

## 2015-01-27 MED ORDER — DICYCLOMINE HCL 10 MG PO CAPS
10.0000 mg | ORAL_CAPSULE | Freq: Three times a day (TID) | ORAL | Status: DC
  Administered 2015-01-28 – 2015-01-29 (×5): 10 mg via ORAL
  Filled 2015-01-27 (×15): qty 1

## 2015-01-27 MED ORDER — NITROGLYCERIN IN D5W 200-5 MCG/ML-% IV SOLN
INTRAVENOUS | Status: AC
  Filled 2015-01-27: qty 250

## 2015-01-27 MED ORDER — GABAPENTIN 300 MG PO CAPS
600.0000 mg | ORAL_CAPSULE | Freq: Three times a day (TID) | ORAL | Status: DC
  Filled 2015-01-27: qty 2

## 2015-01-27 MED ORDER — LIDOCAINE HCL (PF) 1 % IJ SOLN
INTRAMUSCULAR | Status: AC
  Filled 2015-01-27: qty 30

## 2015-01-27 MED ORDER — ASPIRIN EC 81 MG PO TBEC
81.0000 mg | DELAYED_RELEASE_TABLET | Freq: Every day | ORAL | Status: DC
  Administered 2015-01-28 – 2015-02-04 (×8): 81 mg via ORAL
  Filled 2015-01-27 (×10): qty 1

## 2015-01-27 MED ORDER — ATORVASTATIN CALCIUM 80 MG PO TABS
80.0000 mg | ORAL_TABLET | Freq: Every day | ORAL | Status: DC
  Administered 2015-01-28 – 2015-02-03 (×7): 80 mg via ORAL
  Filled 2015-01-27 (×10): qty 1

## 2015-01-27 MED ORDER — HYDROXYUREA 500 MG PO CAPS
1500.0000 mg | ORAL_CAPSULE | Freq: Every day | ORAL | Status: DC
Start: 2015-01-27 — End: 2015-02-04
  Administered 2015-01-28 – 2015-02-04 (×8): 1500 mg via ORAL
  Filled 2015-01-27 (×9): qty 3

## 2015-01-27 MED ORDER — INSULIN REGULAR HUMAN (CONC) 500 UNIT/ML ~~LOC~~ SOLN
100.0000 [IU] | Freq: Two times a day (BID) | SUBCUTANEOUS | Status: DC
  Administered 2015-01-28 – 2015-01-29 (×3): 100 [IU] via SUBCUTANEOUS
  Filled 2015-01-27 (×2): qty 20

## 2015-01-27 MED ORDER — ATROPINE SULFATE 0.1 MG/ML IJ SOLN
INTRAMUSCULAR | Status: AC
  Filled 2015-01-27: qty 10

## 2015-01-27 MED ORDER — ONDANSETRON HCL 4 MG/2ML IJ SOLN: 4.0000 mg | Freq: Four times a day (QID) | INTRAMUSCULAR | Status: DC | PRN

## 2015-01-27 MED ORDER — TICAGRELOR 90 MG PO TABS
90.0000 mg | ORAL_TABLET | Freq: Two times a day (BID) | ORAL | Status: DC
  Administered 2015-01-27 – 2015-02-04 (×16): 90 mg via ORAL
  Filled 2015-01-27 (×18): qty 1

## 2015-01-27 MED ORDER — ACETAMINOPHEN 325 MG PO TABS
650.0000 mg | ORAL_TABLET | ORAL | Status: DC | PRN
  Administered 2015-01-29: 650 mg via ORAL
  Administered 2015-02-03: 325 mg via ORAL
  Filled 2015-01-27 (×2): qty 2

## 2015-01-27 MED ORDER — AMIODARONE HCL IN DEXTROSE 360-4.14 MG/200ML-% IV SOLN
60.0000 mg/h | INTRAVENOUS | Status: AC
  Administered 2015-01-27: 33.33 mL/h via INTRAVENOUS
  Filled 2015-01-27 (×2): qty 200

## 2015-01-27 MED ORDER — AMIODARONE HCL IN DEXTROSE 360-4.14 MG/200ML-% IV SOLN
30.0000 mg/h | INTRAVENOUS | Status: DC
  Administered 2015-01-28 – 2015-01-29 (×3): 30 mg/h via INTRAVENOUS
  Filled 2015-01-27 (×7): qty 200

## 2015-01-27 MED ORDER — FENTANYL CITRATE 0.05 MG/ML IJ SOLN
INTRAMUSCULAR | Status: AC
  Filled 2015-01-27: qty 2

## 2015-01-27 MED ORDER — CHLORHEXIDINE GLUCONATE CLOTH 2 % EX PADS
6.0000 | MEDICATED_PAD | Freq: Every day | CUTANEOUS | Status: AC
  Administered 2015-01-29 – 2015-02-01 (×4): 6 via TOPICAL

## 2015-01-27 MED ORDER — HEPARIN (PORCINE) IN NACL 2-0.9 UNIT/ML-% IJ SOLN
INTRAMUSCULAR | Status: AC
  Filled 2015-01-27: qty 1500

## 2015-01-27 MED ORDER — GABAPENTIN 600 MG PO TABS
600.0000 mg | ORAL_TABLET | Freq: Three times a day (TID) | ORAL | Status: DC
  Administered 2015-01-27 – 2015-02-04 (×23): 600 mg via ORAL
  Filled 2015-01-27 (×26): qty 1

## 2015-01-27 MED ORDER — BIVALIRUDIN 250 MG IV SOLR
INTRAVENOUS | Status: AC
  Filled 2015-01-27: qty 250

## 2015-01-27 MED ORDER — SODIUM CHLORIDE 0.9 % IV SOLN: INTRAVENOUS | Status: DC

## 2015-01-27 MED ORDER — SODIUM CHLORIDE 0.9 % IV SOLN
0.2500 mg/kg/h | INTRAVENOUS | Status: AC
  Administered 2015-01-27: 0.25 mg/kg/h via INTRAVENOUS
  Filled 2015-01-27 (×3): qty 250

## 2015-01-27 MED ORDER — MIDAZOLAM HCL 2 MG/2ML IJ SOLN
INTRAMUSCULAR | Status: AC
  Filled 2015-01-27: qty 2

## 2015-01-27 MED ORDER — METOPROLOL TARTRATE 100 MG PO TABS: 100.0000 mg | ORAL_TABLET | Freq: Two times a day (BID) | ORAL | Status: DC

## 2015-01-27 MED ORDER — LISINOPRIL 40 MG PO TABS
40.0000 mg | ORAL_TABLET | Freq: Every day | ORAL | Status: DC
  Administered 2015-01-28: 40 mg via ORAL
  Filled 2015-01-27 (×2): qty 1

## 2015-01-27 MED ORDER — TICAGRELOR 90 MG PO TABS
ORAL_TABLET | ORAL | Status: AC
  Filled 2015-01-27: qty 2

## 2015-01-27 MED ORDER — MUPIROCIN 2 % EX OINT
1.0000 "application " | TOPICAL_OINTMENT | Freq: Two times a day (BID) | CUTANEOUS | Status: AC
  Administered 2015-01-27 – 2015-02-01 (×10): 1 via NASAL
  Filled 2015-01-27 (×2): qty 22

## 2015-01-27 MED ORDER — ESCITALOPRAM OXALATE 10 MG PO TABS
5.0000 mg | ORAL_TABLET | Freq: Every day | ORAL | Status: DC
  Administered 2015-01-28 – 2015-02-04 (×8): 5 mg via ORAL
  Filled 2015-01-27 (×9): qty 1

## 2015-01-27 MED ORDER — SODIUM CHLORIDE 0.9 % IV SOLN
INTRAVENOUS | Status: DC
  Administered 2015-01-27: 21:00:00 via INTRAVENOUS

## 2015-01-27 MED ORDER — NONFORMULARY OR COMPOUNDED ITEM
1.0000 "application " | Freq: Two times a day (BID) | Status: DC
  Administered 2015-01-27 – 2015-02-04 (×16): 1 via TOPICAL

## 2015-01-27 MED ORDER — NITROGLYCERIN IN D5W 200-5 MCG/ML-% IV SOLN
0.0000 ug/min | INTRAVENOUS | Status: DC
Start: 2015-01-27 — End: 2015-01-30

## 2015-01-27 MED ORDER — AMIODARONE HCL IN DEXTROSE 360-4.14 MG/200ML-% IV SOLN: 30.0000 mg/h | INTRAVENOUS | Status: AC

## 2015-01-27 MED ORDER — GABAPENTIN 100 MG PO CAPS: 200.0000 mg | ORAL_CAPSULE | Freq: Three times a day (TID) | ORAL | Status: DC

## 2015-01-27 MED ORDER — BIVALIRUDIN 250 MG IV SOLR: 1.0000 mg/kg/h | INTRAVENOUS | Status: AC

## 2015-01-27 NOTE — Progress Notes (Signed)
MRSA screen positive. Order set placed per protocol and sign on pts door. Teaching done with pt and family and isolation gowns placed on door.

## 2015-01-27 NOTE — Telephone Encounter (Signed)
Husband requesting call back

## 2015-01-27 NOTE — CV Procedure (Signed)
Amanda [name] is a 74 y.o. female    [id number]  [id number] LOCATION:  FACILITY: [facility]  PHYSICIAN: [name], MD, [facility] [birth year]   DATE OF PROCEDURE:  01/27/2015    EMERGENT CARDIAC CATHETERIZATION/PERCUTANEOUS CORONAY INTERVENTION     HISTORY:    Amanda [name] is a 74 y.o. female history of hypertension, type 2 diabetes mellitus, polycythemia, and significant peripheral neuropathy. She developed new onset chest pain this morning after eating breakfast associated with nausea vomiting and diaphoresis. When EMS arrived at her house there was 3 mm of ST segment elevation inferiorly and in leads V5 and 6 with 2 mm of ST depression anteriorly. During her transport by different EMS she developed an idioventricular rhythm with rates in the 60s.. She was treated with amiodarone bolus 2. She was brought emergently to the cardiac catheterization laboratory.   PROCEDURE: Left heart catheterization: coronary angiography, left ventriculography, temporary pacemaker, percutaneous coronary intervention with PTCA/TES stenting to the mid and distal left circumflex coronary artery   The patient was brought to the [facility] cardiac catherization laboratory after presenting with an inferior ST segment elevation MI with subsequent development of idioventricular rhythm in transient to the hospital. She was brought emergently to the catheterization laboratory. On arrival to the lab she was still having chest pain and she was in idioventricular rhythm. Her right femoral artery was punctured anteriorly and a 6 French arterial sheath was inserted without difficulty. Her right femoral vein was entered anteriorly and a 6 French venous sheath was inserted. A temporary transvenous pacemaker was inserted into the venous sheath and advanced to the RV apex with excellent pacemaker capture and threshold. Diagnostic catheterization was done with Judkins 4 left and right coronary catheters. With the demonstration of  high-grade ulcerated plaque in the left circumflex vessel was made to perform intervention. Angiomax bolus plus infusion was administered and the patient received 180 mg of oral Brilnta for antiplatelet therapy. A 6 French XB 3.5 guide was inserted and a primary wire was advanced into the circumflex on the mid high-grade stenosis and beyond a distal stenosis. A 2.515 mm track balloon was used for dilatation at both sites. A signed Alpine 2.518 mm DES stent was then inserted and advanced to the distal lesion and was deployed at 10 and 12 atm. A 3.023 mm Alpine stent was inserted and deployed at the mid lesion at 12 and 13 atm. A 2.7515 mm and see you for balloon was then inserted in attempt to post-dilate the distal stent. However, the balloon Hanging up the stent strut and consequently was never able to get into the stented segment. This was then removed. An [place]stone Euphora 3.2520 mm balloon was used for post stent dilatation at the mid site up to 12 atm. Another attempt was made to insert a 2.75 noncompliant balloon distally but again there was inability to cross into the stent due to the angle. Since there was excellent stent apposition and the initial stent had been dilated distally up to 2.57 no further attempt at post dilatation was done. Angiography confirmed an excellent result. With reestablishment of flow the patient's rhythm became sinus rhythm. ST segment elevation resolved. She was chest pain-free. A 5 French pigtail catheter was inserted and left ventriculography was performed. Arterial sheath was sutured in place. Plans are to continue bivalirudin post procedure for up to 4 hours in the ST segment elevation MI setting. The temporary pacemaker was removed. She left the catheterization laboratory with stable hemodynamics chest pain-free.  HEMODYNAMICS:   Central Aorta: 140/65   Left Ventricle: 140/64  ANGIOGRAPHY:  Left main: Short normal vessel which immediately bifurcated into an LAD and left  circumflex vessel.  LAD: Moderate size vessel that gave rise to a proximal diagonal vessel and several septal perforating arteries. The vessel was free of significant disease.  Left circumflex: moderate size vessel that gave rise to small first marginal branch at a percent mid stenosis in the small branch. The circumflex beyond this had diffuse 90-95% ulcerated plaque in its mid segment. Distally there was diffuse 80% stenosis in the region of the small branch takeoff.   Right coronary artery: Large caliber vessel which had mild luminal irregularity of 20% in its mid segment. The vessel supplies a large PDA vessel was otherwise free of significant disease.  Left ventriculography revealed mild acute LV dysfunction with an ejection fraction of 45-50%. There was mild focal mid inferior and focal anterolateral hypocontractility concordant with left circumflex disease.  Following PCI to the left circumflex vessel with PTCA/DES stenting and insertion of a 3.023 mm Xience alpine DES stent in the mid circumflex postdilated to 3.15 mm the 95% stenosis was reduced to 0%. The distal 80% stenosis was reduced to 0% with insertion of a 2.518 mm Xience alpine DES stent dilated 2.57 mm.   IMPRESSION:  Acute high risk ST segment elevation inferolateral myocardial infarction secondary to circumflex occlusion with development of idioventricular rhythm with probable spontaneous reperfusion en route by [facility].  Predominent single vessel coronary artery disease with 90-95% ulcerated plaque in the mid AV groove circumflex with distal 80% circumflex stenosis, and 80% stenosis in a very small OM1 vessel; normal LAD; mild 20% irregularity in the mid RCA.  Temporary pacemaker for slow idioventricular rhythm.  Successful acute percutaneous coronary intervention left circumflex vessel with the 90-95% stenosis being reduced to 0% after insertion of a Xience Alpine 3.023 mm DES stent postdilated 3.15 mm, and the  distal circumflex 80% stenosis been reduced to 0% treated with 2.518 mm Xience Alpine DES stent dilated to 2.57 mm.  RECOMMENDATION:  Patient will continue with antiplatelet therapy for minimum of 1 year but probably longer with her diabetes mellitus. Aggressive lipid-lowering therapy, blood pressure control, be necessary. She will be hydrated postprocedure with underlying renal insufficiency.    [name], MD, [facility] 01/27/2015 3:16 PM

## 2015-01-27 NOTE — Progress Notes (Signed)
Pharmacy Note: Re: U-500 Insuline  Patient has provided her home supply of U-500 for use while in the hospital, since it is not stocked at [facility]. We discussed her home regimen:  She confirmed that she draws the syringe to the 20-unit mark, providing 100 units of Insulin. Takes twice a day, before breakfast and supper. To begin on 01/28/15.  Home supply is stored in Rx for daily dispensing.  Kelvin Cellar, RPh  01/27/2015 6:47 PM

## 2015-01-27 NOTE — Telephone Encounter (Signed)
Called pt's husband back. Lvm advising to return call.

## 2015-01-27 NOTE — Progress Notes (Signed)
Chaplain responded to code stemi. Chaplain escorted pt husband, [name], to [facility] admissions waiting and arranged with volunteers for him to receive a pager. Cath lab notified of husband's presence. Page chaplain as needed.    01/27/15 1300  Clinical Encounter Type  Visited With Family  Visit Type Initial;Spiritual support;Code  Spiritual Encounters  Spiritual Needs Emotional  Stress Factors  Family Stress Factors Lack of knowledge  Marcelino Scot 01/27/2015 1:26 PM

## 2015-01-27 NOTE — Telephone Encounter (Signed)
pt husband called to cx appt due to pt in hospital....they will call us back to r/s when she gets out

## 2015-01-27 NOTE — Progress Notes (Addendum)
Arterial sheath removed at 2317. Pressure held for 25 mins. Site level 0 after removal. No bruising or hematoma noted. Vitals remained stable during procedure. No evidence of vagal response or retroperitoneal bleeding noted. Educated pt on the importance of holding leg still and notifying the nurse if she notices any oozing from the area. Pt verbalized understanding. Second nurse present during removal.

## 2015-01-27 NOTE — H&P (Signed)
MAIRA CHRISTON is an 74 y.o. female.   Chief Complaint: Chest Pain HPI:   The patient is a 74 yo obese female with a history of HTN, HLD, DM-uncontrolled, depression, polycythemia, cirrhosis, skin cancer, peripheral neuropathy.  She reports developing chest pain around 0900-0930hrs this morning.  She says it felt like one of her pills was stuck in her throat. She tried drinking water and eating something to move it down.  It became severe around 1100hrs.  It radiated to her back and she vomited.  She denies shortness of breath, orthopnea, dizziness, PND, cough, congestion, abdominal pain, hematochezia, melena, lower extremity edema, claudication.    Medication Sig  acetaminophen-codeine (TYLENOL #3) 300-30 MG per tablet Take one or two tablets every 6 hours prn pain  aspirin EC 81 MG tablet Take 81 mg by mouth daily.  B-D INS SYR ULTRAFINE 1CC/31G 31G X 5/16" 1 ML MISC   cetaphil (CETAPHIL) lotion Apply topically daily.  dicyclomine (BENTYL) 10 MG capsule Take 10 mg by mouth 4 (four) times daily -  before meals and at bedtime.  diphenhydrAMINE (BENADRYL) 25 mg capsule Take 25 mg by mouth every 6 (six) hours as needed.  escitalopram (LEXAPRO) 5 MG tablet Take 1 tablet (5 mg total) by mouth daily.  gabapentin (NEURONTIN) 100 MG capsule Take up to 6 tablets 3x a day as needed for pain  glucose blood (BAYER CONTOUR NEXT TEST) test strip Use to test blood sugar 3 times daily as instructed. Dx code: 250.62  hydrochlorothiazide (MICROZIDE) 12.5 MG capsule Take 1 capsule (12.5 mg total) by mouth every other day.  hydrocortisone 2.5 % cream Apply 1 application topically 2 (two) times daily.  hydroxyurea (HYDREA) 500 MG capsule Take 3 capsules (1,500 mg total) by mouth daily. May take with food to minimize GI side effects.  ibuprofen (ADVIL,MOTRIN) 200 MG tablet Take 200 mg by mouth every 6 (six) hours as needed for moderate pain.  insulin regular human CONCENTRATED (HUMULIN R) 500 UNIT/ML SOLN  injection Inject under skin 100 units before breakfast and dinner  ketoconazole (NIZORAL) 2 % cream Apply topically daily. Apply to rash daily for 10 days  lisinopril (PRINIVIL,ZESTRIL) 40 MG tablet TAKE 1 TABLET (40 MG TOTAL) BY MOUTH DAILY.  loperamide (IMODIUM A-D) 2 MG tablet Take 2 mg by mouth daily.  meloxicam (MOBIC) 7.5 MG tablet Take 1 tablet (7.5 mg total) by mouth daily.  metoprolol (LOPRESSOR) 100 MG tablet Take 1 tablet (100 mg total) by mouth 2 (two) times daily.  NONFORMULARY OR COMPOUNDED ITEM Neuropathy cream: mix Amitriptyline 2%, Lidocaine 2%, and Ketamine 1% - apply on feet 2x a day [facility].  nystatin cream (MYCOSTATIN) Apply 1 application topically 2 (two) times daily.  nystatin-triamcinolone ointment (MYCOLOG) Apply 1 application topically 2 (two) times daily.  ondansetron (ZOFRAN) 8 MG tablet Take 8 mg by mouth every 8 (eight) hours as needed for nausea or vomiting.  oxyCODONE-acetaminophen (PERCOCET/ROXICET) 5-325 MG per tablet   pravastatin (PRAVACHOL) 40 MG tablet Take 1 tablet by mouth  daily  Probiotic Product (PROBIOTIC DAILY PO) Take by mouth daily.  saccharomyces boulardii (FLORASTOR) 250 MG capsule Take 1 capsule (250 mg total) by mouth 2 (two) times daily.     Past Medical History  Diagnosis Date  . Hypertension   . Hyperlipidemia   . Diabetes mellitus 2008  . Depression   . Obesity   . Psoriasis   . Polycythemia     Dr. Elease Hashimoto- [facility]  .  Herpes simplex   . Gout   . Hyperglycemia 05/31/2013  . C. difficile colitis   . Cirrhosis   . Cancer     SKIN  . Neuromuscular disorder     BELL PALSY  . Candidiasis of skin 09/30/2014    Past Surgical History  Procedure Laterality Date  . Breast surgery Left     milk duct  . Total abdominal hysterectomy w/ bilateral salpingoophorectomy      for heavy periods with appendectomy  . Tonsillectomy    . Colonoscopy      Family History  Problem Relation Age of Onset  . Diabetes  Mother   . Heart disease Mother     CAD  . Hyperlipidemia Mother   . Hypertension Mother   . Kidney disease Mother   . Kidney disease Father   . Breast cancer Maternal Aunt   . Heart disease Maternal Grandmother   . Heart disease Other     maternal aunts and uncles   Social History:  reports that she quit smoking about 28 years ago. Her smoking use included Cigarettes. She quit after 48 years of use. She has never used smokeless tobacco. She reports that she does not drink alcohol or use illicit drugs.  Allergies:  Allergies  Allergen Reactions  . Tape Hives  . Doxycycline Hives, Swelling and Rash  . Latex Hives, Itching and Rash    Medications Prior to Admission  Medication Sig Dispense Refill  . acetaminophen-codeine (TYLENOL #3) 300-30 MG per tablet Take one or two tablets every 6 hours prn pain 30 tablet 0  . aspirin EC 81 MG tablet Take 81 mg by mouth daily.    . B-D INS SYR ULTRAFINE 1CC/31G 31G X 5/16" 1 ML MISC     . cetaphil (CETAPHIL) lotion Apply topically daily. 236 mL 0  . dicyclomine (BENTYL) 10 MG capsule Take 10 mg by mouth 4 (four) times daily -  before meals and at bedtime.    . diphenhydrAMINE (BENADRYL) 25 mg capsule Take 25 mg by mouth every 6 (six) hours as needed.    Marland Kitchen escitalopram (LEXAPRO) 5 MG tablet Take 1 tablet (5 mg total) by mouth daily. 30 tablet 1  . gabapentin (NEURONTIN) 100 MG capsule Take up to 6 tablets 3x a day as needed for pain 90 capsule 2  . glucose blood (BAYER CONTOUR NEXT TEST) test strip Use to test blood sugar 3 times daily as instructed. Dx code: 250.62 100 each 11  . hydrochlorothiazide (MICROZIDE) 12.5 MG capsule Take 1 capsule (12.5 mg total) by mouth every other day. 90 capsule 0  . hydrocortisone 2.5 % cream Apply 1 application topically 2 (two) times daily.    . hydroxyurea (HYDREA) 500 MG capsule Take 3 capsules (1,500 mg total) by mouth daily. May take with food to minimize GI side effects. 90 capsule 0  . ibuprofen  (ADVIL,MOTRIN) 200 MG tablet Take 200 mg by mouth every 6 (six) hours as needed for moderate pain.    Marland Kitchen insulin regular human CONCENTRATED (HUMULIN R) 500 UNIT/ML SOLN injection Inject under skin 100 units before breakfast and dinner 20 mL 2  . ketoconazole (NIZORAL) 2 % cream Apply topically daily. Apply to rash daily for 10 days 15 g 1  . lisinopril (PRINIVIL,ZESTRIL) 40 MG tablet TAKE 1 TABLET (40 MG TOTAL) BY MOUTH DAILY. 90 tablet 1  . loperamide (IMODIUM A-D) 2 MG tablet Take 2 mg by mouth daily.    . meloxicam (MOBIC) 7.5  MG tablet Take 1 tablet (7.5 mg total) by mouth daily. 30 tablet 0  . metoprolol (LOPRESSOR) 100 MG tablet Take 1 tablet (100 mg total) by mouth 2 (two) times daily. 180 tablet 1  . NONFORMULARY OR COMPOUNDED ITEM Neuropathy cream: mix Amitriptyline 2%, Lidocaine 2%, and Ketamine 1% - apply on feet 2x a day [facility]. 1 each 2  . nystatin cream (MYCOSTATIN) Apply 1 application topically 2 (two) times daily.    Marland Kitchen nystatin-triamcinolone ointment (MYCOLOG) Apply 1 application topically 2 (two) times daily. 30 g 1  . ondansetron (ZOFRAN) 8 MG tablet Take 8 mg by mouth every 8 (eight) hours as needed for nausea or vomiting.    Marland Kitchen oxyCODONE-acetaminophen (PERCOCET/ROXICET) 5-325 MG per tablet     . Probiotic Product (PROBIOTIC DAILY PO) Take by mouth daily.    Marland Kitchen saccharomyces boulardii (FLORASTOR) 250 MG capsule Take 1 capsule (250 mg total) by mouth 2 (two) times daily. 60 capsule 1    Results for orders placed or performed during the hospital encounter of 01/27/15 (from the past 48 hour(s))  CBC     Status: Abnormal   Collection Time: 01/27/15  1:20 PM  Result Value Ref Range   WBC 24.3 (H) 4.0 - 10.5 K/uL   RBC 4.56 3.87 - 5.11 MIL/uL   Hemoglobin 15.3 (H) 12.0 - 15.0 g/dL   HCT 45.2 36.0 - 46.0 %   MCV 99.1 78.0 - 100.0 fL   MCH 33.6 26.0 - 34.0 pg   MCHC 33.8 30.0 - 36.0 g/dL   RDW 13.7 11.5 - 15.5 %   Platelets 877 (H) 150 - 400 K/uL  CK total and CKMB  (cardiac)     Status: Abnormal   Collection Time: 01/27/15  1:20 PM  Result Value Ref Range   Total CK 728 (H) 7 - 177 U/L   CK, MB 101.3 (HH) 0.3 - 4.0 ng/mL    Comment: REPEATED TO VERIFY CRITICAL RESULT CALLED TO, READ BACK BY AND VERIFIED WITH: S.TWINE,RCIS 01/27/15 1435 BY BSLADE    Relative Index 13.9 (H) 0.0 - 2.5  Comprehensive metabolic panel     Status: Abnormal   Collection Time: 01/27/15  1:20 PM  Result Value Ref Range   Sodium 138 135 - 145 mmol/L   Potassium 4.0 3.5 - 5.1 mmol/L   Chloride 104 96 - 112 mmol/L   CO2 25 19 - 32 mmol/L   Glucose, Bld 155 (H) 70 - 99 mg/dL   BUN 14 6 - 23 mg/dL   Creatinine, Ser 0.85 0.50 - 1.10 mg/dL   Calcium 8.9 8.4 - 10.5 mg/dL   Total Protein 6.2 6.0 - 8.3 g/dL   Albumin 3.5 3.5 - 5.2 g/dL   AST 68 (H) 0 - 37 U/L   ALT 18 0 - 35 U/L   Alkaline Phosphatase 80 39 - 117 U/L   Total Bilirubin 1.2 0.3 - 1.2 mg/dL   GFR calc non Af Amer 65 (L) >90 mL/min   GFR calc Af Amer 76 (L) >90 mL/min    Comment: (NOTE) The eGFR has been calculated using the CKD EPI equation. This calculation has not been validated in all clinical situations. eGFR's persistently <90 mL/min signify possible Chronic Kidney Disease.    Anion gap 9 5 - 15  Lipid panel     Status: Abnormal   Collection Time: 01/27/15  1:20 PM  Result Value Ref Range   Cholesterol 168 0 - 200 mg/dL   Triglycerides  266 (H) <150 mg/dL   HDL 27 (L) >39 mg/dL   Total CHOL/HDL Ratio 6.2 RATIO   VLDL 53 (H) 0 - 40 mg/dL   LDL Cholesterol 88 0 - 99 mg/dL    Comment:        Total Cholesterol/HDL:CHD Risk Coronary Heart Disease Risk Table                     Men   Women  1/2 Average Risk   3.4   3.3  Average Risk       5.0   4.4  2 X Average Risk   9.6   7.1  3 X Average Risk  23.4   11.0        Use the calculated Patient Ratio above and the CHD Risk Table to determine the patient's CHD Risk.        ATP III CLASSIFICATION (LDL):  <100     mg/dL   Optimal  100-129  mg/dL    Near or Above                    Optimal  130-159  mg/dL   Borderline  160-189  mg/dL   High  >190     mg/dL   Very High   Protime-INR     Status: None   Collection Time: 01/27/15  1:20 PM  Result Value Ref Range   Prothrombin Time 15.2 11.6 - 15.2 seconds   INR 1.19 0.00 - 1.49  APTT     Status: Abnormal   Collection Time: 01/27/15  1:20 PM  Result Value Ref Range   aPTT 42 (H) 24 - 37 seconds    Comment:        IF BASELINE aPTT IS ELEVATED, SUGGEST PATIENT RISK ASSESSMENT BE USED TO DETERMINE APPROPRIATE ANTICOAGULANT THERAPY.   Troponin I     Status: Abnormal   Collection Time: 01/27/15  1:20 PM  Result Value Ref Range   Troponin I 3.64 (HH) <0.031 ng/mL    Comment:        POSSIBLE MYOCARDIAL ISCHEMIA. SERIAL TESTING RECOMMENDED. REPEATED TO VERIFY CRITICAL RESULT CALLED TO, READ BACK BY AND VERIFIED WITH: S.TWINE,RCIS 01/27/15 1435 BY BSLADE   I-STAT, chem 8     Status: Abnormal   Collection Time: 01/27/15  1:23 PM  Result Value Ref Range   Sodium 141 135 - 145 mmol/L   Potassium 4.1 3.5 - 5.1 mmol/L   Chloride 102 96 - 112 mmol/L   BUN 15 6 - 23 mg/dL   Creatinine, Ser 2.20 (H) 0.50 - 1.10 mg/dL   Glucose, Bld 166 (H) 70 - 99 mg/dL   Calcium, Ion 1.30 1.13 - 1.30 mmol/L   TCO2 23 0 - 100 mmol/L   Hemoglobin 16.7 (H) 12.0 - 15.0 g/dL   HCT 49.0 (H) 36.0 - 46.0 %  POCT Activated clotting time     Status: None   Collection Time: 01/27/15  1:33 PM  Result Value Ref Range   Activated Clotting Time 976 seconds  POCT Activated clotting time     Status: None   Collection Time: 01/27/15  1:47 PM  Result Value Ref Range   Activated Clotting Time 552 seconds   No results found.  Review of Systems  Constitutional: Negative for fever and diaphoresis.  HENT: Negative for congestion.   Respiratory: Negative for cough and shortness of breath.   Cardiovascular: Positive for chest pain. Negative for orthopnea, leg swelling  and PND.  Gastrointestinal: Positive for  nausea and vomiting. Negative for blood in stool.  Genitourinary: Negative for hematuria.  Musculoskeletal: Negative for myalgias.  Neurological: Positive for dizziness.  All other systems reviewed and are negative.   There were no vitals taken for this visit. Physical Exam  Nursing note and vitals reviewed. Constitutional: She is oriented to person, place, and time. She appears well-developed. No distress.  Obese  HENT:  Head: Normocephalic and atraumatic.  Eyes: EOM are normal. Pupils are equal, round, and reactive to light. No scleral icterus.  Neck: Normal range of motion. Neck supple. No JVD present.  Cardiovascular: Normal rate, regular rhythm, S1 normal and S2 normal.   No murmur heard. Pulses:      Radial pulses are 2+ on the right side, and 2+ on the left side.       Posterior tibial pulses are 2+ on the right side, and 2+ on the left side.  Respiratory: Effort normal and breath sounds normal. She has no wheezes. She has no rales.  GI: Soft. Bowel sounds are normal. She exhibits no distension. There is no tenderness.  Musculoskeletal: She exhibits no edema.  Lymphadenopathy:    She has no cervical adenopathy.  Neurological: She is alert and oriented to person, place, and time. She exhibits normal muscle tone.  Skin: Skin is dry.  Psychiatric: She has a normal mood and affect.     Assessment/Plan Principal Problem:   ST elevation myocardial infarction (STEMI) involving left circumflex coronary artery in recovery phase Active Problems:   Hyperlipidemia   POLYCYTHEMIA   Type 2 diabetes mellitus with neurological manifestations, uncontrolled   Essential (primary) hypertension   Diabetic peripheral neuropathy associated with type 2 diabetes mellitus   ST elevation myocardial infarction (STEMI) of inferior wall, initial episode of care   Idioventricular rhythm   Obesity  The patient was taken emergently to the cath lab for coronary angiography.   NPO until bedrest is  over and patient can sit upright.  She coughs when drinking small sips through a straw.   She has a compounded topical cream for her feet and uses Humulin 51m BID.  Her husband will bring the supply from home.    HTarri Fuller PGrass Valley4/08/2015, 3:35 PM   Patient seen and examined. Agree with assessment and plan. Miss DMaicey Barrientezis a 74year old female who has a history of hypertension, type 2 diabetes mellitus with significant peripheral neuropathy, polycythemia,valvular onset chest pain this morning after eating. She developed nausea and vomiting and diaphoresis. Different EMS arrived. Her ECG showed 3 mm of inferior lateral ST segment elevation with precordial ST segment depression of 2 mm consistent with an acute inferolateral ST segment elevation myocardial infarction. [facility] into an idioventricular slow rhythm. She was treated with boluses of amiodarone 150 mg x2. She was brought emergently to the cardiac catheterization laboratory by EMS upon arrival to the hospital.   TTroy Sine MD, FSaint [facility]4/08/2015 6:24 PM

## 2015-01-27 NOTE — Telephone Encounter (Signed)
Refill request

## 2015-01-28 ENCOUNTER — Ambulatory Visit: Payer: Self-pay | Admitting: Hematology and Oncology

## 2015-01-28 ENCOUNTER — Other Ambulatory Visit: Payer: Self-pay

## 2015-01-28 ENCOUNTER — Inpatient Hospital Stay (HOSPITAL_COMMUNITY): Payer: Medicare Other

## 2015-01-28 DIAGNOSIS — I10 Essential (primary) hypertension: Secondary | ICD-10-CM

## 2015-01-28 DIAGNOSIS — I5031 Acute diastolic (congestive) heart failure: Secondary | ICD-10-CM

## 2015-01-28 DIAGNOSIS — I255 Ischemic cardiomyopathy: Secondary | ICD-10-CM

## 2015-01-28 DIAGNOSIS — I25110 Atherosclerotic heart disease of native coronary artery with unstable angina pectoris: Secondary | ICD-10-CM

## 2015-01-28 LAB — BASIC METABOLIC PANEL
Anion gap: 10 (ref 5–15)
BUN: 14 mg/dL (ref 6–23)
CHLORIDE: 105 mmol/L (ref 96–112)
CO2: 24 mmol/L (ref 19–32)
CREATININE: 0.91 mg/dL (ref 0.50–1.10)
Calcium: 8.5 mg/dL (ref 8.4–10.5)
GFR, EST AFRICAN AMERICAN: 70 mL/min — AB (ref >90)
GFR, EST NON AFRICAN AMERICAN: 60 mL/min — AB (ref >90)
GLUCOSE: 183 mg/dL — AB (ref 70–99)
Potassium: 4.3 mmol/L (ref 3.5–5.1)
Sodium: 139 mmol/L (ref 135–145)

## 2015-01-28 LAB — CBC
HEMATOCRIT: 46 % (ref 36.0–46.0)
Hemoglobin: 15.6 g/dL — ABNORMAL HIGH (ref 12.0–15.0)
MCH: 34.1 pg — AB (ref 26.0–34.0)
MCHC: 33.9 g/dL (ref 30.0–36.0)
MCV: 100.4 fL — AB (ref 78.0–100.0)
RBC: 4.58 MIL/uL (ref 3.87–5.11)
RDW: 14.1 % (ref 11.5–15.5)
WBC: 27.7 10*3/uL — AB (ref 4.0–10.5)

## 2015-01-28 LAB — GLUCOSE, CAPILLARY
Glucose-Capillary: 238 mg/dL — ABNORMAL HIGH (ref 70–99)
Glucose-Capillary: 180 mg/dL — ABNORMAL HIGH (ref 70–99)
Glucose-Capillary: 108 mg/dL — ABNORMAL HIGH (ref 70–99)
Glucose-Capillary: 67 mg/dL — ABNORMAL LOW (ref 70–99)
Glucose-Capillary: 75 mg/dL (ref 70–99)

## 2015-01-28 LAB — HEMOGLOBIN A1C
Hgb A1c MFr Bld: 8.8 % — ABNORMAL HIGH (ref 4.8–5.6)
MEAN PLASMA GLUCOSE: 206 mg/dL

## 2015-01-28 LAB — CLOSTRIDIUM DIFFICILE BY PCR: Toxigenic C. Difficile by PCR: NEGATIVE

## 2015-01-28 MED ORDER — FUROSEMIDE 10 MG/ML IJ SOLN
20.0000 mg | Freq: Once | INTRAMUSCULAR | Status: AC
  Administered 2015-01-28: 20 mg via INTRAVENOUS

## 2015-01-28 MED ORDER — FUROSEMIDE 10 MG/ML IJ SOLN
INTRAMUSCULAR | Status: AC
  Filled 2015-01-28: qty 2

## 2015-01-28 MED ORDER — FUROSEMIDE 10 MG/ML IJ SOLN
40.0000 mg | Freq: Two times a day (BID) | INTRAMUSCULAR | Status: DC
  Administered 2015-01-28 – 2015-01-29 (×3): 40 mg via INTRAVENOUS
  Filled 2015-01-28 (×4): qty 4

## 2015-01-28 MED ORDER — NITROGLYCERIN 0.4 MG SL SUBL
SUBLINGUAL_TABLET | SUBLINGUAL | Status: AC
  Administered 2015-01-28: 0.4 mg
  Filled 2015-01-28: qty 1

## 2015-01-28 MED ORDER — METOPROLOL TARTRATE 50 MG PO TABS
50.0000 mg | ORAL_TABLET | Freq: Two times a day (BID) | ORAL | Status: DC
  Administered 2015-01-28: 50 mg via ORAL
  Filled 2015-01-28 (×4): qty 1

## 2015-01-28 MED FILL — Sodium Chloride IV Soln 0.9%: INTRAVENOUS | Qty: 50 | Status: AC

## 2015-01-28 NOTE — Progress Notes (Signed)
[phone] Pt stated she has been having diarrhea today. She appears SOB lying in bed and talking. Husband in room. Began MI education which was interrupted when pt needed to go to [facility] suddenly due to diarrhea. Assisted to [facility] and notified RN. Pt had reddened area on bottom that was painful for her. Pt cleaned and cream to bottom with her RN's assistance. Assisted back to bed. Did not attempt education after that as pt exhausted. Did review MI, NTG use and stent/brilinta. Will follow up tomorrow. Graylon Good RN BSN 01/28/2015 2:35 PM

## 2015-01-28 NOTE — Progress Notes (Addendum)
At approximately 0515, pt exhibited increased work of breathing as evidenced by accessory muscle use, audible expiratory wheezing, tachypnea and subjective reports of restlessness and anxiety. Pt was on 2 lpm N/C with sats decreasing to around 88%. O2 titrated up to 6 lpm with improvement in saturation to 93%. Gave pt one SL ntg tablet and performed EKG which showed new mild ST depression in lateral leads. Auscultated diminished fine crackles and expiratory wheezes in all lung fields. Notified cardiology fellow who instructed to stop current IV fluids and give 20 mg IV Lasix. Order also placed for CXR. Pt now resting comfortably, will continue to monitor. Johnsie Cancel, RN 6:28 AM

## 2015-01-28 NOTE — Progress Notes (Addendum)
SUBJECTIVE:  BP 159/63 mmHg  Pulse 88  Temp(Src) 98.4 F (36.9 C) (Oral)  Resp 24  SpO2 98%  Intake/Output Summary (Last 24 hours) at 01/28/15 1021 Last data filed at 01/28/15 1000  Gross per 24 hour  Intake 2778.2 ml  Output   1075 ml  Net 1703.2 ml    PHYSICAL EXAM General: Well developed, well nourished, in no acute distress. Alert and oriented x 3.  Psych:  Good affect, responds appropriately Neck: No JVD. No masses noted.  Lungs: Clear bilaterally with no wheezes or rhonci noted.  Heart: RRR with no murmurs noted. Abdomen: Bowel sounds are present. Soft, non-tender.  Extremities: No lower extremity edema.   LABS: Basic Metabolic Panel:  Recent Labs  01/27/15 1320 01/27/15 1323 01/28/15 0350  NA 138 141 139  K 4.0 4.1 4.3  CL 104 102 105  CO2 25  --  24  GLUCOSE 155* 166* 183*  BUN 14 15 14   CREATININE 0.85 2.20* 0.91  CALCIUM 8.9  --  8.5   CBC:  Recent Labs  01/27/15 1320 01/27/15 1323  WBC 24.3*  --   HGB 15.3* 16.7*  HCT 45.2 49.0*  MCV 99.1  --   PLT 877*  --    Cardiac Enzymes:  Recent Labs  01/27/15 1320 01/27/15 1720  CKTOTAL 728*  --   CKMB 101.3*  --   TROPONINI 3.64* 56.44*   Fasting Lipid Panel:  Recent Labs  01/27/15 1320  CHOL 168  HDL 27*  LDLCALC 88  TRIG 266*  CHOLHDL 6.2    Current Meds: . aspirin EC  81 mg Oral Daily  . atorvastatin  80 mg Oral q1800  . Chlorhexidine Gluconate Cloth  6 each Topical Q0600  . dicyclomine  10 mg Oral TID AC & HS  . escitalopram  5 mg Oral Daily  . gabapentin  600 mg Oral TID  . hydroxyurea  1,500 mg Oral Daily  . insulin regular human CONCENTRATED  100 Units Subcutaneous BID WC  . lisinopril  40 mg Oral Daily  . mupirocin ointment  1 application Nasal BID  . Neuropathy cream: Amitriptyline 2%, Lidocaine 2%, and Ketamine 1%  1 application Topical BID  . ticagrelor  90 mg Oral BID   Cardiac cath 01/27/15: Left main: Short normal vessel which immediately bifurcated  into an LAD and left circumflex vessel. LAD: Moderate size vessel that gave rise to a proximal diagonal vessel and several septal perforating arteries. The vessel was free of significant disease. Left circumflex: moderate size vessel that gave rise to small first marginal branch at a percent mid stenosis in the small branch. The circumflex beyond this had diffuse 90-95% ulcerated plaque in its mid segment. Distally there was diffuse 80% stenosis in the region of the small branch takeoff.  Right coronary artery: Large caliber vessel which had mild luminal irregularity of 20% in its mid segment. The vessel supplies a large PDA vessel was otherwise free of significant disease. Left ventriculography revealed mild acute LV dysfunction with an ejection fraction of 45-50%. There was mild focal mid inferior and focal anterolateral hypocontractility concordant with left circumflex disease. Following PCI to the left circumflex vessel with PTCA/DES stenting and insertion of a 3.023 mm Xience alpine DES stent in the mid circumflex postdilated to 3.15 mm the 95% stenosis was reduced to 0%. The distal 80% stenosis was reduced to 0% with insertion of a 2.518 mm Xience alpine DES stent dilated 2.57 mm.  ASSESSMENT AND PLAN:  1. CAD/Acute inferolateral STEMI: She was admitted 01/27/15 with acute inferolateral STEMI. Cardiac cath per Dr. [name] with severe stenosis mid Circumflex and distal Circumflex treated with 2 DES. She is doing well this am on ASA, Brilinta, statin, Ace-inh. Will restart metoprolol  2. Acute diastolic CHF: Will diurese today with IV Lasix. Echo later today  3. Ischemic cardiomyopathy: LVEF=45-50% by LV gram yesterday. Echo today  4. HTN: BP elevated. Restart metoprolol  5. DM, type 2 with controlled BS at home and circulatory complications: Home meds   Monitor in CCU today given volume overload.   Amanda [name]  4/12/201610:21 AM

## 2015-01-28 NOTE — Progress Notes (Signed)
Pt CBG 67. Given 4oz OJ. Rechecked CBG is 75. Will continue to monitor.

## 2015-01-28 NOTE — Progress Notes (Signed)
UR Completed.  [phone]  

## 2015-01-29 DIAGNOSIS — N179 Acute kidney failure, unspecified: Secondary | ICD-10-CM

## 2015-01-29 DIAGNOSIS — I370 Nonrheumatic pulmonary valve stenosis: Secondary | ICD-10-CM

## 2015-01-29 LAB — BASIC METABOLIC PANEL
Anion gap: 10 (ref 5–15)
BUN: 29 mg/dL — AB (ref 6–23)
CHLORIDE: 108 mmol/L (ref 96–112)
CO2: 22 mmol/L (ref 19–32)
CREATININE: 2.03 mg/dL — AB (ref 0.50–1.10)
Calcium: 8.3 mg/dL — ABNORMAL LOW (ref 8.4–10.5)
GFR calc Af Amer: 26 mL/min — ABNORMAL LOW (ref >90)
GFR calc non Af Amer: 23 mL/min — ABNORMAL LOW (ref >90)
Glucose, Bld: 75 mg/dL (ref 70–99)
Potassium: 4.2 mmol/L (ref 3.5–5.1)
Sodium: 140 mmol/L (ref 135–145)

## 2015-01-29 LAB — GLUCOSE, CAPILLARY
GLUCOSE-CAPILLARY: 113 mg/dL — AB (ref 70–99)
Glucose-Capillary: 116 mg/dL — ABNORMAL HIGH (ref 70–99)
Glucose-Capillary: 153 mg/dL — ABNORMAL HIGH (ref 70–99)

## 2015-01-29 LAB — CBC
HCT: 42.5 % (ref 36.0–46.0)
Hemoglobin: 13.8 g/dL (ref 12.0–15.0)
MCH: 33.1 pg (ref 26.0–34.0)
MCHC: 32.5 g/dL (ref 30.0–36.0)
MCV: 101.9 fL — AB (ref 78.0–100.0)
Platelets: 827 10*3/uL — ABNORMAL HIGH (ref 150–400)
RBC: 4.17 MIL/uL (ref 3.87–5.11)
RDW: 14.2 % (ref 11.5–15.5)
WBC: 25.1 10*3/uL — AB (ref 4.0–10.5)

## 2015-01-29 MED ORDER — LOPERAMIDE HCL 2 MG PO CAPS
2.0000 mg | ORAL_CAPSULE | ORAL | Status: DC | PRN
  Administered 2015-01-29: 2 mg via ORAL
  Filled 2015-01-29: qty 1

## 2015-01-29 MED ORDER — METOPROLOL TARTRATE 25 MG PO TABS
25.0000 mg | ORAL_TABLET | Freq: Two times a day (BID) | ORAL | Status: DC
Start: 2015-01-29 — End: 2015-02-02
  Administered 2015-01-29 – 2015-02-01 (×8): 25 mg via ORAL
  Filled 2015-01-29 (×10): qty 1

## 2015-01-29 MED ORDER — INSULIN REGULAR HUMAN (CONC) 500 UNIT/ML ~~LOC~~ SOLN
100.0000 [IU] | Freq: Two times a day (BID) | SUBCUTANEOUS | Status: DC
  Administered 2015-01-29: 100 [IU] via SUBCUTANEOUS
  Filled 2015-01-29 (×2): qty 20

## 2015-01-29 MED ORDER — SODIUM CHLORIDE 0.9 % IV SOLN
INTRAVENOUS | Status: AC
Start: 2015-01-29 — End: 2015-01-29
  Administered 2015-01-29: 10:00:00 via INTRAVENOUS

## 2015-01-29 NOTE — Progress Notes (Signed)
Inpatient Diabetes Program Recommendations  AACE/ADA: New Consensus Statement on Inpatient Glycemic Control (2013)  Target Ranges:  Prepandial:   less than 140 mg/dL      Peak postprandial:   less than 180 mg/dL (1-2 hours)      Critically ill patients:  140 - 180 mg/dL  Results for Amanda [name], Amanda [name] (MRN [id number]) as of 01/29/2015 09:22  Ref. Range 01/28/2015 12:47 01/28/2015 17:29 01/28/2015 21:07 01/28/2015 22:38 01/29/2015 08:36  Glucose-Capillary Latest Ref Range: 70-99 mg/dL 180 (H) 108 (H) 67 (L) 75 116 (H)   Inpatient Diabetes Program Recommendations Insulin - Basal: consider reducing dose of U-500 by 10-20% to prevent hypoglycemia Thank you  Raoul Pitch BSN, RN,CDE Inpatient Diabetes Coordinator [phone] (team pager)

## 2015-01-29 NOTE — Progress Notes (Signed)
*  PRELIMINARY RESULTS* Echocardiogram 2D Echocardiogram has been performed.  Leavy Cella 01/29/2015, 11:51 AM

## 2015-01-29 NOTE — Progress Notes (Signed)
10m IV lasix administered at 2000. No urine output at 03:30. Bladder scan done which showed only 734min bladder. Pt resting comfortably, denies the urge to urinate.

## 2015-01-29 NOTE — Clinical Documentation Improvement (Signed)
MD's, NP's, and PA's  Noted mention of "renal insufficiency" if appropriate for this admission, please provide a more specific diagnosis.  Thank you    Possible Clinical Conditions?   Acute Renal Failure/Acute Kidney Injury Acute on Chronic Renal Failure  CardioRenal Syndrome/CKD stage ? (stage 1-5, please document stage) Chronic Renal Failure CKD stage ? (stage 1-5, please document stage)  Other Condition Cannot Clinically Determine    Risk Factors: STEMI, Acute diastolic hearf failure , DM 2, HTN   Diagnostics: GFR 23 (4/13) 60(4/12) 65 (4/11)   Creatinine, Ser 0.50 - 1.10 mg/dL 2.03 (H) 0.91CM 2.20 (H)        Treatments: IV Lasix, NS @ 123m  Thank You, LRee Kida,RN Clinical Documentation Specialist:  [email or web address]

## 2015-01-29 NOTE — Progress Notes (Signed)
SUBJECTIVE: No chest pain or SOB.   BP 101/55 mmHg  Pulse 65  Temp(Src) 97.5 F (36.4 C) (Oral)  Resp 15  SpO2 97%  Intake/Output Summary (Last 24 hours) at 01/29/15 0855 Last data filed at 01/29/15 0800  Gross per 24 hour  Intake 1084.1 ml  Output    250 ml  Net  834.1 ml    PHYSICAL EXAM General: Well developed, well nourished, in no acute distress. Alert and oriented x 3.  Psych:  Good affect, responds appropriately Neck: No JVD. No masses noted.  Lungs: Clear bilaterally with no wheezes or rhonci noted.  Heart: RRR with no murmurs noted. Abdomen: Bowel sounds are present. Soft, non-tender.  Extremities: No lower extremity edema.   LABS: Basic Metabolic Panel:  Recent Labs  01/28/15 0350 01/29/15 0316  NA 139 140  K 4.3 4.2  CL 105 108  CO2 24 22  GLUCOSE 183* 75  BUN 14 29*  CREATININE 0.91 2.03*  CALCIUM 8.5 8.3*   CBC:  Recent Labs  01/28/15 0350 01/29/15 0316  WBC 27.7* 25.1*  HGB 15.6* 13.8  HCT 46.0 42.5  MCV 100.4* 101.9*  PLT PLATELET CLUMPS NOTED ON SMEAR, COUNT APPEARS INCREASED 827*   Cardiac Enzymes:  Recent Labs  01/27/15 1320 01/27/15 1720  CKTOTAL 728*  --   CKMB 101.3*  --   TROPONINI 3.64* 56.44*   Fasting Lipid Panel:  Recent Labs  01/27/15 1320  CHOL 168  HDL 27*  LDLCALC 88  TRIG 266*  CHOLHDL 6.2    Current Meds: . aspirin EC  81 mg Oral Daily  . atorvastatin  80 mg Oral q1800  . Chlorhexidine Gluconate Cloth  6 each Topical Q0600  . dicyclomine  10 mg Oral TID AC & HS  . escitalopram  5 mg Oral Daily  . furosemide  40 mg Intravenous BID  . gabapentin  600 mg Oral TID  . hydroxyurea  1,500 mg Oral Daily  . insulin regular human CONCENTRATED  100 Units Subcutaneous BID WC  . lisinopril  40 mg Oral Daily  . metoprolol tartrate  50 mg Oral BID  . mupirocin ointment  1 application Nasal BID  . Neuropathy cream: Amitriptyline 2%, Lidocaine 2%, and Ketamine 1%  1 application Topical BID  . ticagrelor   90 mg Oral BID   ASSESSMENT AND PLAN:  Cardiac cath 01/27/15: Left main: Short normal vessel which immediately bifurcated into an LAD and left circumflex vessel. LAD: Moderate size vessel that gave rise to a proximal diagonal vessel and several septal perforating arteries. The vessel was free of significant disease. Left circumflex: moderate size vessel that gave rise to small first marginal branch at a percent mid stenosis in the small branch. The circumflex beyond this had diffuse 90-95% ulcerated plaque in its mid segment. Distally there was diffuse 80% stenosis in the region of the small branch takeoff.  Right coronary artery: Large caliber vessel which had mild luminal irregularity of 20% in its mid segment. The vessel supplies a large PDA vessel was otherwise free of significant disease. Left ventriculography revealed mild acute LV dysfunction with an ejection fraction of 45-50%. There was mild focal mid inferior and focal anterolateral hypocontractility concordant with left circumflex disease. Following PCI to the left circumflex vessel with PTCA/DES stenting and insertion of a 3.023 mm Xience alpine DES stent in the mid circumflex postdilated to 3.15 mm the 95% stenosis was reduced to 0%. The distal 80% stenosis was reduced  to 0% with insertion of a 2.518 mm Xience alpine DES stent dilated 2.57 mm.  ASSESSMENT AND PLAN:  1. CAD/Acute inferolateral STEMI: She was admitted 01/27/15 with acute inferolateral STEMI. Cardiac cath per Dr. [name] with severe stenosis mid Circumflex and distal Circumflex treated with 2 DES. She is doing well this am on ASA, Brilinta, statin, beta blocker. Will stop Ace-inh with acute renal failure post contrast.   2. Acute diastolic CHF: Volume is ok today. Will stop Lasix today.   3. Ischemic cardiomyopathy: LVEF=45-50% by LV gram yesterday. Echo today  4. HTN: BP controlled and low this am. Hold Ace-inh. Lower metoprolol to 25 mg po BID.   5. DM, type 2 with  controlled BS at home and circulatory complications: Home meds  6. Acute renal failure: post cath, likely due to contrast nephropathy. Will hold Ace-inh, hydrate gently today. Repeat BMET in am.     Amanda [name],Amanda [name]  4/13/20168:55 AM

## 2015-01-29 NOTE — Progress Notes (Signed)
CARDIAC REHAB PHASE I   PRE:  Rate/Rhythm: 42 SR  BP:  Supine: 121/53  Sitting:   Standing:    SaO2: 95% 3L  MODE:  Ambulation: 60 ft   POST:  Rate/Rhythm: 81 SR  BP:  Supine:   Sitting: 107/38  Standing:    SaO2: 96% 3L 1352-1435 Pt stated she mainly laid in bed three weeks before coming to hospital with very little walking. Pt very deconditioned, leaning backward upon standing. Had to rock and have assistance to stand. Needs PT consult. Notified pt's RN. Pt walked 30 ft on 3L with gait belt use, rolling walker and asst x 2 with recliner following. Rested and then walked another 30 ft back to room. DOE but sats stayed good on 3L. Left in recliner with call bell. No c/o dizziness or CP. Will keep as asst x 2.  Graylon Good, RN BSN  01/29/2015 2:29 PM

## 2015-01-30 DIAGNOSIS — D751 Secondary polycythemia: Secondary | ICD-10-CM

## 2015-01-30 DIAGNOSIS — N141 Nephropathy induced by other drugs, medicaments and biological substances: Secondary | ICD-10-CM

## 2015-01-30 DIAGNOSIS — E1141 Type 2 diabetes mellitus with diabetic mononeuropathy: Secondary | ICD-10-CM

## 2015-01-30 LAB — CBC
HEMATOCRIT: 41.3 % (ref 36.0–46.0)
HEMOGLOBIN: 13.6 g/dL (ref 12.0–15.0)
MCH: 33.3 pg (ref 26.0–34.0)
MCHC: 32.9 g/dL (ref 30.0–36.0)
MCV: 101.2 fL — AB (ref 78.0–100.0)
Platelets: 804 10*3/uL — ABNORMAL HIGH (ref 150–400)
RBC: 4.08 MIL/uL (ref 3.87–5.11)
RDW: 14 % (ref 11.5–15.5)
WBC: 22.5 10*3/uL — ABNORMAL HIGH (ref 4.0–10.5)

## 2015-01-30 LAB — BASIC METABOLIC PANEL
Anion gap: 13 (ref 5–15)
BUN: 48 mg/dL — ABNORMAL HIGH (ref 6–23)
CALCIUM: 8.1 mg/dL — AB (ref 8.4–10.5)
CO2: 20 mmol/L (ref 19–32)
Chloride: 107 mmol/L (ref 96–112)
Creatinine, Ser: 2.11 mg/dL — ABNORMAL HIGH (ref 0.50–1.10)
GFR calc Af Amer: 25 mL/min — ABNORMAL LOW (ref >90)
GFR calc non Af Amer: 22 mL/min — ABNORMAL LOW (ref >90)
GLUCOSE: 67 mg/dL — AB (ref 70–99)
POTASSIUM: 4.5 mmol/L (ref 3.5–5.1)
Sodium: 140 mmol/L (ref 135–145)

## 2015-01-30 LAB — GLUCOSE, CAPILLARY
GLUCOSE-CAPILLARY: 148 mg/dL — AB (ref 70–99)
GLUCOSE-CAPILLARY: 94 mg/dL (ref 70–99)
GLUCOSE-CAPILLARY: 136 mg/dL — AB (ref 70–99)
Glucose-Capillary: 69 mg/dL — ABNORMAL LOW (ref 70–99)
Glucose-Capillary: 259 mg/dL — ABNORMAL HIGH (ref 70–99)

## 2015-01-30 MED ORDER — INSULIN REGULAR HUMAN (CONC) 500 UNIT/ML ~~LOC~~ SOLN
80.0000 [IU] | Freq: Two times a day (BID) | SUBCUTANEOUS | Status: DC
  Administered 2015-01-30 – 2015-02-02 (×6): 80 [IU] via SUBCUTANEOUS
  Filled 2015-01-30 (×6): qty 20

## 2015-01-30 MED ORDER — ALPRAZOLAM 0.25 MG PO TABS
0.2500 mg | ORAL_TABLET | Freq: Three times a day (TID) | ORAL | Status: DC | PRN
  Administered 2015-01-31 – 2015-02-03 (×3): 0.25 mg via ORAL
  Filled 2015-01-30 (×4): qty 1

## 2015-01-30 MED ORDER — SODIUM CHLORIDE 0.9 % IV SOLN
INTRAVENOUS | Status: AC
  Administered 2015-01-30: 09:00:00 via INTRAVENOUS

## 2015-01-30 NOTE — Progress Notes (Signed)
Patient arrived to unit from [facility]. Patient A/O, VSS, SR on the monitor, pain 0/10. Right groin site C/D/I no s/s swelling hematoma. POC discussed with the patient and family. Patient currently resting comfortably. afleming RN

## 2015-01-30 NOTE — Progress Notes (Addendum)
CARDIAC REHAB PHASE I   PRE:  Rate/Rhythm: 83 SR  BP:  Supine: 148/56  Sitting:   Standing:    SaO2: 93 RA  MODE:  Ambulation: 250 ft   POST:  Rate/Rhythm: 87  BP:  Supine:   Sitting: 145/49  Standing:    SaO2: 94 RA 1345-1420 Assisted X 2 used gait belt and rollator to ambulate. Pt able to get up easier today and when she stood she was steady. Pt able to walk 250 feet without c/o. She took one short sitting rest stop. VS stable Pt to recliner after walk with call light in reach and husband present. Pt less SOB today and room air sats today in hall and on return 92-94%.  Rodney Langton RN 01/30/2015 2:15 PM

## 2015-01-30 NOTE — Evaluation (Signed)
Physical Therapy Evaluation Patient Details Name: Amanda [name] MRN: [id number] DOB: 20-Sep-1941 Today's Date: 01/30/2015   History of Present Illness  74 yo admitted 01/27/15 with acute inferolateral STEMI s/p cardiac cath . PMHx of ICM, DM, HTN  Clinical Impression  Pt pleasant but flat affect, disoriented to day with some noticeable wheezing but no SOB or drop in oxygen sats with mobility. Pt with decreased strength, endurance and gait who will benefit from acute therapy to maximize mobility, function and activity tolerance to decrease burden of care. Recommend daily mobility with nursing and continued HEP throughout the day.    Follow Up Recommendations Home health PT    Equipment Recommendations  Rolling walker with 5" wheels;Other (comment) (rollator and tub bench)    Recommendations for Other Services       Precautions / Restrictions Precautions Precautions: Fall Restrictions Weight Bearing Restrictions: No      Mobility  Bed Mobility Overal bed mobility: Modified Independent             General bed mobility comments: increased time with cues for sequence  Transfers Overall transfer level: Needs assistance   Transfers: Sit to/from Stand Sit to Stand: Supervision         General transfer comment: cues for hand placement and safety, no posterior lean today as noted in prior notes  Ambulation/Gait Ambulation/Gait assistance: Min guard Ambulation Distance (Feet): 165 Feet Assistive device: Rolling walker (2 wheeled) Gait Pattern/deviations: Step-through pattern;Decreased stride length;Trunk flexed   Gait velocity interpretation: Below normal speed for age/gender General Gait Details: cues for posture and stepping into RW  Stairs            Wheelchair Mobility    Modified Rankin (Stroke Patients Only)       Balance Overall balance assessment: Needs assistance   Sitting balance-Leahy Scale: Good       Standing balance-Leahy Scale: Fair                                Pertinent Vitals/Pain Pain Assessment: No/denies pain  BP 96/55 (66) HR 75 sats 95% on RA    Home Living Family/patient expects to be discharged to:: Private residence Living Arrangements: Spouse/significant other Available Help at Discharge: Family;Available 24 hours/day Type of Home: Apartment Home Access: Level entry     Home Layout: One level Home Equipment: None      Prior Function Level of Independence: Independent         Comments: pt states she and husband share housework but he does heavy tasks and shopping. pt reports limited mobility in home and just to doctors visits prior to admission     Hand Dominance        Extremity/Trunk Assessment   Upper Extremity Assessment: Generalized weakness           Lower Extremity Assessment: Generalized weakness      Cervical / Trunk Assessment: Kyphotic  Communication   Communication: No difficulties  Cognition Arousal/Alertness: Awake/alert Behavior During Therapy: Flat affect Overall Cognitive Status: Impaired/Different from baseline Area of Impairment: Orientation Orientation Level: Time                  General Comments      Exercises General Exercises - Lower Extremity Long Arc Quad: AROM;Seated;Both;15 reps Hip Flexion/Marching: AROM;Seated;Both;15 reps      Assessment/Plan    PT Assessment Patient needs continued PT services  PT Diagnosis Difficulty walking;Generalized weakness  PT Problem List Decreased strength;Decreased activity tolerance;Decreased balance;Decreased mobility;Decreased knowledge of use of DME  PT Treatment Interventions Gait training;DME instruction;Functional mobility training;Therapeutic activities;Therapeutic exercise;Patient/family education   PT Goals (Current goals can be found in the Care Plan section) Acute Rehab PT Goals Patient Stated Goal: return home and do for myself PT Goal Formulation: With  patient/family Time For Goal Achievement: 02/13/15 Potential to Achieve Goals: Good    Frequency Min 3X/week   Barriers to discharge Decreased caregiver support spouse works 2 part time jobs but states he can be at home and take off as needed    Co-evaluation               End of Carmen During Treatment: Gait belt Activity Tolerance: Patient tolerated treatment well Patient left: in chair;with call bell/phone within reach;with family/visitor present Nurse Communication: Mobility status         Time: 9996-7227 PT Time Calculation (min) (ACUTE ONLY): 24 min   Charges:   PT Evaluation $Initial PT Evaluation Tier I: 1 Procedure PT Treatments $Therapeutic Activity: 8-22 mins   PT G CodesMelford Aase 01/30/2015, 9:13 AM  Elwyn Reach, [place]

## 2015-01-30 NOTE — Progress Notes (Addendum)
SUBJECTIVE: No chest pain or SOB. Only c/o anxiety.   BP 127/60 mmHg  Pulse 70  Temp(Src) 97.5 F (36.4 C) (Oral)  Resp 16  SpO2 97%  Intake/Output Summary (Last 24 hours) at 01/30/15 0816 Last data filed at 01/29/15 2100  Gross per 24 hour  Intake 1159.17 ml  Output    651 ml  Net 508.17 ml    PHYSICAL EXAM General: Well developed, well nourished, in no acute distress. Alert and oriented x 3.  Psych:  Good affect, responds appropriately Neck: No JVD. No masses noted.  Lungs: Clear bilaterally with no wheezes or rhonci noted.  Heart: RRR with no murmurs noted. Abdomen: Bowel sounds are present. Soft, non-tender.  Extremities: No lower extremity edema.   LABS: Basic Metabolic Panel:  Recent Labs  01/29/15 0316 01/30/15 0357  NA 140 140  K 4.2 4.5  CL 108 107  CO2 22 20  GLUCOSE 75 67*  BUN 29* 48*  CREATININE 2.03* 2.11*  CALCIUM 8.3* 8.1*   CBC:  Recent Labs  01/29/15 0316 01/30/15 0357  WBC 25.1* 22.5*  HGB 13.8 13.6  HCT 42.5 41.3  MCV 101.9* 101.2*  PLT 827* 804*   Cardiac Enzymes:  Recent Labs  01/27/15 1320 01/27/15 1720  CKTOTAL 728*  --   CKMB 101.3*  --   TROPONINI 3.64* 56.44*   Fasting Lipid Panel:  Recent Labs  01/27/15 1320  CHOL 168  HDL 27*  LDLCALC 88  TRIG 266*  CHOLHDL 6.2    Current Meds: . aspirin EC  81 mg Oral Daily  . atorvastatin  80 mg Oral q1800  . Chlorhexidine Gluconate Cloth  6 each Topical Q0600  . dicyclomine  10 mg Oral TID AC & HS  . escitalopram  5 mg Oral Daily  . gabapentin  600 mg Oral TID  . hydroxyurea  1,500 mg Oral Daily  . insulin regular human CONCENTRATED  100 Units Subcutaneous BID WC  . metoprolol tartrate  25 mg Oral BID  . mupirocin ointment  1 application Nasal BID  . Neuropathy cream: Amitriptyline 2%, Lidocaine 2%, and Ketamine 1%  1 application Topical BID  . ticagrelor  90 mg Oral BID   Cardiac cath 01/27/15: Left main: Short normal vessel which immediately bifurcated  into an LAD and left circumflex vessel. LAD: Moderate size vessel that gave rise to a proximal diagonal vessel and several septal perforating arteries. The vessel was free of significant disease. Left circumflex: moderate size vessel that gave rise to small first marginal branch at a percent mid stenosis in the small branch. The circumflex beyond this had diffuse 90-95% ulcerated plaque in its mid segment. Distally there was diffuse 80% stenosis in the region of the small branch takeoff.  Right coronary artery: Large caliber vessel which had mild luminal irregularity of 20% in its mid segment. The vessel supplies a large PDA vessel was otherwise free of significant disease. Left ventriculography revealed mild acute LV dysfunction with an ejection fraction of 45-50%. There was mild focal mid inferior and focal anterolateral hypocontractility concordant with left circumflex disease. Following PCI to the left circumflex vessel with PTCA/DES stenting and insertion of a 3.023 mm Xience alpine DES stent in the mid circumflex postdilated to 3.15 mm the 95% stenosis was reduced to 0%. The distal 80% stenosis was reduced to 0% with insertion of a 2.518 mm Xience alpine DES stent dilated 2.57 mm.  Echo 01/29/15: Left ventricle: possbile mild posterior lateral wall  hypokinesis. The cavity size was normal. Wall thickness was increased in a pattern of moderate LVH. Systolic function was normal. The estimated ejection fraction was in the range of 50% to 55%. Doppler parameters are consistent with elevated ventricular end-diastolic filling pressure. - Mitral valve: Calcified annulus. - Left atrium: The atrium was moderately dilated. - Right atrium: Calcified mass in dome of RA seen on apical views not seen on subcostal. Doubt clinical signficiance. - Atrial septum: No defect or patent foramen ovale was identified. - Impressions: Abnormal posterior lateral and septal strain Impressions: - Abnormal  posterior lateral and septal strain  ASSESSMENT AND PLAN:  1. CAD/Acute inferolateral STEMI: She was admitted 01/27/15 with acute inferolateral STEMI. Cardiac cath per Dr. [name] with severe stenosis mid Circumflex and distal Circumflex treated with 2 DES. She is doing well this am on ASA, Brilinta, statin, beta blocker. Will continue to hold Ace-inh with acute renal failure post contrast.   2. Acute diastolic CHF: Volume is ok today.  3. Ischemic cardiomyopathy: LVEF=50-55% by LV gram yesterday.   4. HTN: BP controlled. Hold Ace-inh.   5. DM, type 2 with controlled BS at home and circulatory complications: Blood sugars low this am. Decreasing insulin with low po intake.   6. Acute renal failure/CIN/acute kidney injury: post cath, likely due to contrast induced nephropathy. Creatinine stable today. Hopefully this is the peak and it will trend down tomorrow. UOP over 30cc/hour. Will continue to hold Ace-inh, continue hydration. BMET tomorrow.   7. Polycythemia: Continue hyroxyurea  8. Anxiety: Will start Xanax prn.   Transfer to telemetry. D/C when renal function is stable.     MCALHANY,CHRISTOPHER  4/14/20168:16 AM

## 2015-01-30 NOTE — Evaluation (Signed)
Occupational Therapy Evaluation Patient Details Name: Amanda [name] MRN: [id number] DOB: 11-Mar-1941 Today's Date: 01/30/2015    History of Present Illness 74 yo admitted 01/27/15 with acute inferolateral STEMI s/p cardiac cath . PMHx of ICM, DM, HTN   Clinical Impression   Pt was independent in self care prior to admission.  She was fearful of stepping over the edge of the tub at home.  She and her husband shared IADL in the last month due to pt having poor activity tolerance.  Pt reports a sedentary lifestyle.  Pt presents with decreased balance and is easily fatigued.  02 sats remained 92-94% on RA.  Began instruction in energy conservation and benefits of 3 in 1 and tub transfer bench for home use.  Will follow.   Follow Up Recommendations  No OT follow up    Equipment Recommendations  Tub/shower bench;3 in 1 bedside comode    Recommendations for Other Services       Precautions / Restrictions Precautions Precautions: Fall Restrictions Weight Bearing Restrictions: No      Mobility Bed Mobility               General bed mobility comments: up in chair  Transfers Overall transfer level: Needs assistance Equipment used: Rolling walker (2 wheeled) Transfers: Sit to/from Stand Sit to Stand: Supervision         General transfer comment: cues for hand placement, no physical assist    Balance                                            ADL Overall ADL's : Needs assistance/impaired Eating/Feeding: Independent;Sitting   Grooming: Wash/dry hands;Standing;Supervision/safety   Upper Body Bathing: Set up;Sitting   Lower Body Bathing: Supervison/ safety;Sit to/from stand   Upper Body Dressing : Set up;Sitting   Lower Body Dressing: Supervision/safety;Sit to/from stand   Toilet Transfer: Supervision/safety;Ambulation;BSC;RW   Toileting- Clothing Manipulation and Hygiene: Supervision/safety;Sit to/from stand       Functional mobility during  ADLs: Supervision/safety;Rolling walker General ADL Comments: Pt able to cross foot over opposite knee to reach feet.  Began instruction in energy conservation.     Vision     Perception     Praxis      Pertinent Vitals/Pain Pain Assessment: No/denies pain     Hand Dominance Right   Extremity/Trunk Assessment Upper Extremity Assessment Upper Extremity Assessment: Overall WFL for tasks assessed   Lower Extremity Assessment Lower Extremity Assessment: Defer to PT evaluation       Communication Communication Communication: No difficulties   Cognition Arousal/Alertness: Awake/alert Behavior During Therapy: Anxious Overall Cognitive Status: Within Functional Limits for tasks assessed                     General Comments       Exercises       Shoulder Instructions      Home Living Family/patient expects to be discharged to:: Private residence Living Arrangements: Spouse/significant other Available Help at Discharge: Family;Available 24 hours/day Type of Home: Apartment Home Access: Level entry     Home Layout: One level     Bathroom Shower/Tub: Teacher, early years/pre: Handicapped height     Home Equipment: Grab bars - tub/shower          Prior Functioning/Environment Level of Independence: Independent  Comments: pt states she and husband share housework but he does heavy tasks and shopping. pt reports limited mobility in home and just to doctors visits prior to admission    OT Diagnosis: Generalized weakness   OT Problem List: Decreased activity tolerance;Impaired balance (sitting and/or standing);Decreased knowledge of use of DME or AE;Cardiopulmonary status limiting activity;Obesity   OT Treatment/Interventions: Self-care/ADL training;Energy conservation;DME and/or AE instruction;Patient/family education    OT Goals(Current goals can be found in the care plan section) Acute Rehab OT Goals Patient Stated Goal: return  home and do for myself OT Goal Formulation: With patient Time For Goal Achievement: 02/06/15 Potential to Achieve Goals: Good ADL Goals Pt Will Perform Grooming: with modified independence;standing Pt Will Perform Lower Body Bathing: with modified independence;sit to/from stand Pt Will Perform Lower Body Dressing: with modified independence;sit to/from stand Pt Will Transfer to Toilet: with modified independence;ambulating;bedside commode (over toilet) Pt Will Perform Toileting - Clothing Manipulation and hygiene: with modified independence;sit to/from stand Additional ADL Goal #1: Pt will utilize energy conservation strategies in ADL independently. Additional ADL Goal #2: Pt will be knowledgeable in use of DME (3 in 1 and tub bench) for safety and energy conservation.  OT Frequency: Min 2X/week   Barriers to D/C:            Co-evaluation              End of Session    Activity Tolerance: Patient tolerated treatment well Patient left: in chair;with family/visitor present;with call bell/phone within reach   Time: 1520-1543 OT Time Calculation (min): 23 min Charges:  OT General Charges $OT Visit: 1 Procedure OT Evaluation $Initial OT Evaluation Tier I: 1 Procedure G-Codes:    Malka So 01/30/2015, 3:53 PM  [phone]

## 2015-01-31 ENCOUNTER — Inpatient Hospital Stay (HOSPITAL_COMMUNITY): Payer: Medicare Other

## 2015-01-31 ENCOUNTER — Encounter (HOSPITAL_COMMUNITY): Payer: Self-pay | Admitting: *Deleted

## 2015-01-31 LAB — BASIC METABOLIC PANEL
Anion gap: 12 (ref 5–15)
BUN: 52 mg/dL — ABNORMAL HIGH (ref 6–23)
CO2: 23 mmol/L (ref 19–32)
Calcium: 8.1 mg/dL — ABNORMAL LOW (ref 8.4–10.5)
Chloride: 107 mmol/L (ref 96–112)
Creatinine, Ser: 1.68 mg/dL — ABNORMAL HIGH (ref 0.50–1.10)
GFR calc Af Amer: 33 mL/min — ABNORMAL LOW (ref >90)
GFR calc non Af Amer: 29 mL/min — ABNORMAL LOW (ref >90)
Glucose, Bld: 236 mg/dL — ABNORMAL HIGH (ref 70–99)
Potassium: 4.3 mmol/L (ref 3.5–5.1)
Sodium: 142 mmol/L (ref 135–145)

## 2015-01-31 LAB — BLOOD GAS, ARTERIAL
Acid-base deficit: 2.4 mmol/L — ABNORMAL HIGH (ref 0.0–2.0)
BICARBONATE: 22.6 meq/L (ref 20.0–24.0)
DELIVERY SYSTEMS: POSITIVE
DRAWN BY: 418751
Expiratory PAP: 5
FIO2: 0.5 %
Inspiratory PAP: 12
O2 SAT: 98.8 %
PCO2 ART: 44 mmHg (ref 35.0–45.0)
Patient temperature: 98.6
RATE: 10 resp/min
TCO2: 23.9 mmol/L (ref 0–100)
pH, Arterial: 7.331 — ABNORMAL LOW (ref 7.350–7.450)
pO2, Arterial: 165 mmHg — ABNORMAL HIGH (ref 80.0–100.0)

## 2015-01-31 LAB — GLUCOSE, CAPILLARY
GLUCOSE-CAPILLARY: 153 mg/dL — AB (ref 70–99)
GLUCOSE-CAPILLARY: 129 mg/dL — AB (ref 70–99)
GLUCOSE-CAPILLARY: 144 mg/dL — AB (ref 70–99)
Glucose-Capillary: 180 mg/dL — ABNORMAL HIGH (ref 70–99)
Glucose-Capillary: 156 mg/dL — ABNORMAL HIGH (ref 70–99)

## 2015-01-31 MED ORDER — CHLORHEXIDINE GLUCONATE 0.12 % MT SOLN: 15.0000 mL | Freq: Two times a day (BID) | OROMUCOSAL | Status: DC

## 2015-01-31 MED ORDER — FUROSEMIDE 10 MG/ML IJ SOLN
40.0000 mg | Freq: Once | INTRAMUSCULAR | Status: AC
  Administered 2015-01-31: 40 mg via INTRAVENOUS

## 2015-01-31 MED ORDER — CETYLPYRIDINIUM CHLORIDE 0.05 % MT LIQD: 7.0000 mL | Freq: Two times a day (BID) | OROMUCOSAL | Status: DC

## 2015-01-31 MED ORDER — LORAZEPAM 2 MG/ML IJ SOLN
1.0000 mg | Freq: Once | INTRAMUSCULAR | Status: AC
  Administered 2015-01-31: 1 mg via INTRAVENOUS

## 2015-01-31 MED ORDER — CETYLPYRIDINIUM CHLORIDE 0.05 % MT LIQD
7.0000 mL | Freq: Two times a day (BID) | OROMUCOSAL | Status: DC
  Administered 2015-01-31 – 2015-02-04 (×6): 7 mL via OROMUCOSAL

## 2015-01-31 MED ORDER — NITROGLYCERIN 0.4 MG SL SUBL
SUBLINGUAL_TABLET | SUBLINGUAL | Status: AC
  Administered 2015-01-31: 0.4 mg
  Filled 2015-01-31: qty 12.5

## 2015-01-31 MED ORDER — LORAZEPAM 2 MG/ML IJ SOLN
INTRAMUSCULAR | Status: AC
  Administered 2015-01-31: 1 mg via INTRAVENOUS
  Filled 2015-01-31: qty 1

## 2015-01-31 MED ORDER — FUROSEMIDE 10 MG/ML IJ SOLN
INTRAMUSCULAR | Status: AC
  Filled 2015-01-31: qty 4

## 2015-01-31 MED ORDER — ALBUTEROL SULFATE (2.5 MG/3ML) 0.083% IN NEBU
INHALATION_SOLUTION | RESPIRATORY_TRACT | Status: AC
  Filled 2015-01-31: qty 3

## 2015-01-31 MED ORDER — NITROGLYCERIN 0.4 MG SL SUBL: 0.4000 mg | SUBLINGUAL_TABLET | SUBLINGUAL | Status: DC | PRN

## 2015-01-31 NOTE — Significant Event (Signed)
Rapid Response Event Note Called per floor RN for Pt with severely increased WOB. Per RN Pt 02 sat 88-90 on RA, 6 LNC placed, WOB unchanged.   Overview: Time Called: 0303 Arrival Time: 0308 Event Type: Respiratory  Initial Focused Assessment: Pt in bed, in clear distress. RR 30-40, accessory muscle use. Pt states "I cant breath, Im dying". Diaphoretic and clammy skin. Face flushed. Lung sounds course with expiratory wheezes. ABD distended but soft. Pt denies chest pain or pain elsewhere.  Interventions: Dr. Aundra Dubin paged and updated on Pt status. 40 mg lasix, NEB treatment and CXR ordered. Lasix given. Neb Albuterol given. Pt WOB worsening, bipap prepared per RT At bedside. Dr. Aundra Dubin paged again and requested to bedside, MD updated on worsening WOB, po2 94-96 on 6 LNC. Ativan, NTG, Bipap, EKG and FC ordered.  EKG obtained yielding ST, Pt continued to deny chest pain. Bipap placed and new PIV obtained for ativan after loss of access. Ativan 25m given as well as NTG SL as ordered. FC placed with about 400 ml out immeditaly in bag. After about 20 minutes on Bipap, Pt RR improved 15-20, po2 98-100% , Bipap 12/5 50%. Pt transfered to [place], report given per floor RN to ICU RN on 2 H.   Event Summary: Name of Physician Notified: Dr. MAundra Dubin at [phone] Name of Consulting Physician Notified: Dr. MAundra Dubinat 0330  Outcome: Transferred (Comment)     WDema Severin BTieton

## 2015-01-31 NOTE — Progress Notes (Signed)
Transported pt on BiPAP from 2W10 to 2H12 without any complications.

## 2015-01-31 NOTE — Progress Notes (Signed)
At about 0250, pt called out and said she was having trouble breathing. Upon walking in room, pt was very sob, panicky and stating she couldn't catch her breath. Pulse ox sat read 88% on RA and bp 121/94 with hr 95. IVF's D/C'd.   Pt pulled up in bed and elevated head and 4Lnc placed.  Pt continued to become more sob and RRT RN  called at [place]. Pt's lung sounds with wheezes. Dr. Aundra Dubin notified at [phone] of situation and ordered lasix 31m, pcxr, albuteral treatment and to stop ivf's. During the next 15 mins pt continued to struggle to breathe and BiPap was placed on pt. CBG checked and was 129. EKG done showing SR. At 0320 ativan 1 mg iv was given and 1 sl ntg.  Dr. MAundra Dubinnotified again at 0330 of pt's condition by rapid response nurse. Order to transfer pt. To icu. Foley catheter was also placed. At time of transfer-0440- pt was breathing much easier on bipap 50%. All belongings transferred with pt including her glasses.  [name] was notified on [facility] at 0447 about events and transfer to 2h12.

## 2015-01-31 NOTE — Progress Notes (Signed)
   01/31/15 0330  BiPAP/CPAP/SIPAP  BiPAP/CPAP/SIPAP Pt Type Adult  Mask Type Full face mask  Mask Size Large  Set Rate 10 breaths/min  Respiratory Rate 20 breaths/min  IPAP 12 cmH20  EPAP 5 cmH2O  Oxygen Percent 50 %  Minute Ventilation 15.2  Leak 18  Peak Inspiratory Pressure (PIP) 13  Tidal Volume (Vt) 770  BiPAP/CPAP/SIPAP BiPAP  Patient Home Equipment No  Auto Titrate No  Patient placed on BIPAP due to severe respiratory distress and increase WOB.

## 2015-01-31 NOTE — Progress Notes (Signed)
Patient admitted to [facility]. Patient's husband Milagros Middendorf called and updated on patient 's locations and current condition.

## 2015-01-31 NOTE — Progress Notes (Signed)
Patient ID: Amanda [name], female   DOB: 1941-02-20, 74 y.o.   MRN: [id number]  Patient woke up short of breath and wheezing, very anxious.  No chest pain.  Oxygen saturation dropped, CXR with worsening CHF.   - Placed on Bipap and gave 40 mg IV Lasix.  - Stop IV fluid - Get ECG  Loralie Champagne 01/31/2015

## 2015-01-31 NOTE — Progress Notes (Signed)
SUBJECTIVE: Breathing is better this am. Events of last night noted. Pt was on telemetry and had onset of dyspnea and anxiety. Rapid response was called and she was placed on bipap. Dr. Aundra Dubin assessed with chest x-ray and she was found to have pulmonary edema. Lasix 40 mg IV given x 1. Pt transferred to CCU on bipap. No chest pain.   BP 138/49 mmHg  Pulse 71  Temp(Src) 97.8 F (36.6 C) (Axillary)  Resp 14  SpO2 100%  Intake/Output Summary (Last 24 hours) at 01/31/15 0804 Last data filed at 01/31/15 0600  Gross per 24 hour  Intake    340 ml  Output   1275 ml  Net   -935 ml    PHYSICAL EXAM General: Well developed, well nourished, in no acute distress. Alert and oriented x 3.  Psych:  Good affect, responds appropriately Neck: No JVD. No masses noted.  Lungs: Crackles bases bilaterally with no wheezes or rhonci noted.  Heart: RRR with no murmurs noted. Abdomen: Bowel sounds are present. Soft, non-tender.  Extremities: No lower extremity edema.   LABS: Basic Metabolic Panel:  Recent Labs  01/29/15 0316 01/30/15 0357  NA 140 140  K 4.2 4.5  CL 108 107  CO2 22 20  GLUCOSE 75 67*  BUN 29* 48*  CREATININE 2.03* 2.11*  CALCIUM 8.3* 8.1*   CBC:  Recent Labs  01/29/15 0316 01/30/15 0357  WBC 25.1* 22.5*  HGB 13.8 13.6  HCT 42.5 41.3  MCV 101.9* 101.2*  PLT 827* 804*   Current Meds: . albuterol      . antiseptic oral rinse  7 mL Mouth Rinse q12n4p  . aspirin EC  81 mg Oral Daily  . atorvastatin  80 mg Oral q1800  . chlorhexidine  15 mL Mouth Rinse BID  . Chlorhexidine Gluconate Cloth  6 each Topical Q0600  . escitalopram  5 mg Oral Daily  . gabapentin  600 mg Oral TID  . hydroxyurea  1,500 mg Oral Daily  . insulin regular human CONCENTRATED  80 Units Subcutaneous BID WC  . metoprolol tartrate  25 mg Oral BID  . mupirocin ointment  1 application Nasal BID  . Neuropathy cream: Amitriptyline 2%, Lidocaine 2%, and Ketamine 1%  1 application Topical BID  .  ticagrelor  90 mg Oral BID   Cardiac cath 01/27/15: Left main: Short normal vessel which immediately bifurcated into an LAD and left circumflex vessel. LAD: Moderate size vessel that gave rise to a proximal diagonal vessel and several septal perforating arteries. The vessel was free of significant disease. Left circumflex: moderate size vessel that gave rise to small first marginal branch at a percent mid stenosis in the small branch. The circumflex beyond this had diffuse 90-95% ulcerated plaque in its mid segment. Distally there was diffuse 80% stenosis in the region of the small branch takeoff.  Right coronary artery: Large caliber vessel which had mild luminal irregularity of 20% in its mid segment. The vessel supplies a large PDA vessel was otherwise free of significant disease. Left ventriculography revealed mild acute LV dysfunction with an ejection fraction of 45-50%. There was mild focal mid inferior and focal anterolateral hypocontractility concordant with left circumflex disease. Following PCI to the left circumflex vessel with PTCA/DES stenting and insertion of a 3.023 mm Xience alpine DES stent in the mid circumflex postdilated to 3.15 mm the 95% stenosis was reduced to 0%. The distal 80% stenosis was reduced to 0% with insertion of a  2.518 mm Xience alpine DES stent dilated 2.57 mm.  Echo 01/29/15: Left ventricle: possbile mild posterior lateral wall hypokinesis. The cavity size was normal. Wall thickness was increased in a pattern of moderate LVH. Systolic function was normal. The estimated ejection fraction was in the range of 50% to 55%. Doppler parameters are consistent with elevated ventricular end-diastolic filling pressure. - Mitral valve: Calcified annulus. - Left atrium: The atrium was moderately dilated. - Right atrium: Calcified mass in dome of RA seen on apical views not seen on subcostal. Doubt clinical signficiance. - Atrial septum: No defect or patent  foramen ovale was identified. - Impressions: Abnormal posterior lateral and septal strain Impressions: - Abnormal posterior lateral and septal strain  ASSESSMENT AND PLAN:  1. CAD/Acute inferolateral STEMI: She was admitted 01/27/15 with acute inferolateral STEMI. Cardiac cath per Dr. [name] with severe stenosis mid Circumflex and distal Circumflex treated with 2 DES. She is stable on ASA, Brilinta, statin, beta blocker. Will continue to hold Ace-inh with acute renal failure post contrast.   2. Acute diastolic CHF: She woke up this am with SOB. Chest x-ray with worsened edema. She has been aggressively hydrated due to post contrast nephropathy. IVF have been stopped. She was given one dose of IV Lasix this am and breathing is better. Bipap removed. Will give one more dose of IV Lasix this afternoon. Reassess volume status in the am.   3. Ischemic cardiomyopathy: LVEF=50-55% by LV gram 01/27/15.  4. HTN: BP controlled. Hold Ace-inh.   5. 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    6. Acute renal failure/CIN/acute kidney injury: post cath, likely due to contrast induced nephropathy. Creatinine pending today. will trend down tomorrow. UOP over 30cc/hour. Will continue to hold Ace-inh. No IVF today with acute diastolic CHF.   7. Polycythemia: Continue hyroxyurea  8. Anxiety: Continue Xanax prn.      Amanda [name]  4/15/20168:04 AM

## 2015-02-01 DIAGNOSIS — I2119 ST elevation (STEMI) myocardial infarction involving other coronary artery of inferior wall: Secondary | ICD-10-CM

## 2015-02-01 DIAGNOSIS — I5031 Acute diastolic (congestive) heart failure: Secondary | ICD-10-CM

## 2015-02-01 DIAGNOSIS — I5032 Chronic diastolic (congestive) heart failure: Secondary | ICD-10-CM

## 2015-02-01 DIAGNOSIS — E785 Hyperlipidemia, unspecified: Secondary | ICD-10-CM

## 2015-02-01 LAB — BASIC METABOLIC PANEL
Anion gap: 9 (ref 5–15)
BUN: 54 mg/dL — AB (ref 6–23)
CO2: 26 mmol/L (ref 19–32)
CREATININE: 1.32 mg/dL — AB (ref 0.50–1.10)
Calcium: 8.4 mg/dL (ref 8.4–10.5)
Chloride: 110 mmol/L (ref 96–112)
GFR calc Af Amer: 45 mL/min — ABNORMAL LOW (ref >90)
GFR calc non Af Amer: 38 mL/min — ABNORMAL LOW (ref >90)
Glucose, Bld: 74 mg/dL (ref 70–99)
POTASSIUM: 4.4 mmol/L (ref 3.5–5.1)
SODIUM: 145 mmol/L (ref 135–145)

## 2015-02-01 LAB — GLUCOSE, CAPILLARY
GLUCOSE-CAPILLARY: 296 mg/dL — AB (ref 70–99)
Glucose-Capillary: 111 mg/dL — ABNORMAL HIGH (ref 70–99)
Glucose-Capillary: 168 mg/dL — ABNORMAL HIGH (ref 70–99)

## 2015-02-01 MED ORDER — LISINOPRIL 40 MG PO TABS
40.0000 mg | ORAL_TABLET | Freq: Every day | ORAL | Status: DC
  Administered 2015-02-01 – 2015-02-04 (×4): 40 mg via ORAL
  Filled 2015-02-01 (×4): qty 1

## 2015-02-01 MED ORDER — FUROSEMIDE 10 MG/ML IJ SOLN
40.0000 mg | Freq: Once | INTRAMUSCULAR | Status: AC
  Administered 2015-02-01: 40 mg via INTRAVENOUS
  Filled 2015-02-01: qty 4

## 2015-02-01 NOTE — Progress Notes (Signed)
Patient Name: Amanda [name] Date of Encounter: 02/01/2015  Principal Problem:   ST elevation myocardial infarction (STEMI) involving left circumflex coronary artery in recovery phase Active Problems:   Hyperlipidemia   POLYCYTHEMIA   Type 2 diabetes mellitus with neurological manifestations, uncontrolled   Essential (primary) hypertension   Diabetic peripheral neuropathy associated with type 2 diabetes mellitus   ST elevation myocardial infarction (STEMI) of inferior wall, initial episode of care   Idioventricular rhythm   Length of Stay: 5  SUBJECTIVE  No angina or dyspnea. Creatinine markedly improved despite continued net negative fluid balance. Still roughly 1.6L "ahead" since admission. Weight has not been recorded.  CURRENT MEDS . antiseptic oral rinse  7 mL Mouth Rinse BID  . aspirin EC  81 mg Oral Daily  . atorvastatin  80 mg Oral q1800  . escitalopram  5 mg Oral Daily  . gabapentin  600 mg Oral TID  . hydroxyurea  1,500 mg Oral Daily  . insulin regular human CONCENTRATED  80 Units Subcutaneous BID WC  . metoprolol tartrate  25 mg Oral BID  . mupirocin ointment  1 application Nasal BID  . Neuropathy cream: Amitriptyline 2%, Lidocaine 2%, and Ketamine 1%  1 application Topical BID  . ticagrelor  90 mg Oral BID    OBJECTIVE   Intake/Output Summary (Last 24 hours) at 02/01/15 0949 Last data filed at 02/01/15 0900  Gross per 24 hour  Intake   1460 ml  Output   2150 ml  Net   -690 ml   PHYSICAL EXAM Filed Vitals:   02/01/15 0600 02/01/15 0700 02/01/15 0800 02/01/15 0900  BP: 165/59 164/56 170/64 199/80  Pulse: 73 77 90 91  Temp:   97.7 F (36.5 C)   TempSrc:   Oral   Resp: 17 22 18 20   SpO2: 88% 96% 95% 89%   General: Alert, oriented x3, no distress Head: no evidence of trauma, PERRL, EOMI, no exophtalmos or lid lag, no myxedema, no xanthelasma; normal ears, nose and oropharynx Neck: normal jugular venous pulsations and no hepatojugular reflux; brisk  carotid pulses without delay and no carotid bruits Chest: clear to auscultation, no signs of consolidation by percussion or palpation, normal fremitus, symmetrical and full respiratory excursions Cardiovascular: normal position and quality of the apical impulse, regular rhythm, normal first and second heart sounds, no rubs or gallops, no murmur Abdomen: no tenderness or distention, no masses by palpation, no abnormal pulsatility or arterial bruits, normal bowel sounds, no hepatosplenomegaly Extremities: no clubbing, cyanosis or edema; 2+ radial, ulnar and brachial pulses bilaterally; 2+ right femoral, posterior tibial and dorsalis pedis pulses; 2+ left femoral, posterior tibial and dorsalis pedis pulses; no subclavian or femoral bruits Neurological: grossly nonfocal  LABS  CBC  Recent Labs  01/30/15 0357  WBC 22.5*  HGB 13.6  HCT 41.3  MCV 101.2*  PLT 762*   Basic Metabolic Panel  Recent Labs  01/31/15 0853 02/01/15 0233  NA 142 145  K 4.3 4.4  CL 107 110  CO2 23 26  GLUCOSE 236* 74  BUN 52* 54*  CREATININE 1.68* 1.32*  CALCIUM 8.1* 8.4    Radiology Studies Imaging results have been reviewed and Dg Chest Port 1 View  01/31/2015   CLINICAL DATA:  Shortness of breath  EXAM: PORTABLE CHEST - 1 VIEW  COMPARISON:  01/28/2015  FINDINGS: There is cardiomegaly and vascular pedicle widening which has a stable appearance since prior. Diffuse interstitial opacity with cephalized blood flow. No visible pleural  effusion.  IMPRESSION: CHF, worse than 3 days ago.   Electronically Signed   By: Monte Fantasia M.D.   On: 01/31/2015 04:30    TELE NSR  ECG   ASSESSMENT AND PLAN 1. CAD/Acute inferolateral STEMI: She was admitted 01/27/15 with acute inferolateral STEMI. Cardiac cath per Dr. [name] with severe stenosis mid Circumflex and distal Circumflex treated with 2 DES. She is stable on ASA, Brilinta, statin, beta blocker. Will continue to hold Ace-inh with acute renal failure post  contrast.   2. Acute diastolic CHF: Improving. She has been aggressively hydrated due to post contrast nephropathy. IVF have been stopped. Still fluid overloaded. One more dose of IV diuretic today. Transfer to telemetry. May be ready for DC tomorrow.  3. Ischemic cardiomyopathy: LVEF=50-55% by LV gram 01/27/15.  4. HTN: BP high. Start Ace-inh.   5. 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   6. Acute renal failure/CIN/acute kidney injury: post cath, likely due to contrast induced nephropathy. Creatinine rapidly improving.   7. Polycythemia: Continue hyroxyurea  8. Anxiety: Continue Xanax prn.     Sanda Barretto, MD, [facility] [phone] office [phone] pager 02/01/2015 9:49 AM

## 2015-02-01 NOTE — Progress Notes (Signed)
Husband brought in [place] from outside of hospital. Education on fluid restriction as well as sodium and diabetes control. Husband  states that he is aware but he felt like this would make her feel better.

## 2015-02-01 NOTE — Progress Notes (Signed)
CARDIAC REHAB PHASE I   PRE:  Rate/Rhythm: 75 SR  BP:  Supine:   Sitting: 179/59  Standing:    SaO2: 89% 2L O2 inc to 96% 3L O2  MODE:  Ambulation: 40 ft   POST:  Rate/Rhythm: 83  BP:  Supine:   Sitting: 184/91  Standing:    SaO2: 96% 3L O2 1430-1511 Prior to walk assisted patient to bedside commode. Ambulated 40 ft outside of room with assist x2 and pushing rollator walker, patient needed recliner chair brought to her just inside the door. BP elevated before and after walk, pt's RN aware.    Sol Passer, MS, ACSM CCEP

## 2015-02-01 NOTE — Progress Notes (Signed)
Discussed with patient the possible need for out patient therapy once she is discharged from hospital. Dicussed the benefits due to safety at home in regards to falls. Patient states that she wants to go home she is not going to go to rehab. Reeducation and ask patient to think about it and talk to her family.

## 2015-02-02 LAB — GLUCOSE, CAPILLARY
GLUCOSE-CAPILLARY: 182 mg/dL — AB (ref 70–99)
GLUCOSE-CAPILLARY: 156 mg/dL — AB (ref 70–99)
Glucose-Capillary: 169 mg/dL — ABNORMAL HIGH (ref 70–99)
Glucose-Capillary: 112 mg/dL — ABNORMAL HIGH (ref 70–99)
Glucose-Capillary: 65 mg/dL — ABNORMAL LOW (ref 70–99)
Glucose-Capillary: 109 mg/dL — ABNORMAL HIGH (ref 70–99)
Glucose-Capillary: 173 mg/dL — ABNORMAL HIGH (ref 70–99)

## 2015-02-02 MED ORDER — FUROSEMIDE 40 MG PO TABS
40.0000 mg | ORAL_TABLET | Freq: Every day | ORAL | Status: DC
  Administered 2015-02-02 – 2015-02-04 (×3): 40 mg via ORAL
  Filled 2015-02-02 (×3): qty 1

## 2015-02-02 MED ORDER — METOPROLOL TARTRATE 100 MG PO TABS
100.0000 mg | ORAL_TABLET | Freq: Two times a day (BID) | ORAL | Status: DC
  Administered 2015-02-02 – 2015-02-04 (×5): 100 mg via ORAL
  Filled 2015-02-02 (×6): qty 1

## 2015-02-02 NOTE — Progress Notes (Signed)
Patient Name: Amanda [name] Date of Encounter: 02/02/2015  Principal Problem:   ST elevation myocardial infarction (STEMI) involving left circumflex coronary artery in recovery phase Active Problems:   Hyperlipidemia   POLYCYTHEMIA   Type 2 diabetes mellitus with neurological manifestations, uncontrolled   Essential (primary) hypertension   Diabetic peripheral neuropathy associated with type 2 diabetes mellitus   ST elevation myocardial infarction (STEMI) of inferior wall, initial episode of care   Idioventricular rhythm   Acute diastolic heart failure   Length of Stay: 6  SUBJECTIVE  Feels much better today. Weak and dyspneic yesterday - only walked 5 steps.  CURRENT MEDS . antiseptic oral rinse  7 mL Mouth Rinse BID  . aspirin EC  81 mg Oral Daily  . atorvastatin  80 mg Oral q1800  . escitalopram  5 mg Oral Daily  . gabapentin  600 mg Oral TID  . hydroxyurea  1,500 mg Oral Daily  . insulin regular human CONCENTRATED  80 Units Subcutaneous BID WC  . lisinopril  40 mg Oral Daily  . metoprolol tartrate  100 mg Oral BID  . Neuropathy cream: Amitriptyline 2%, Lidocaine 2%, and Ketamine 1%  1 application Topical BID  . ticagrelor  90 mg Oral BID    OBJECTIVE   Intake/Output Summary (Last 24 hours) at 02/02/15 0905 Last data filed at 02/02/15 0800  Gross per 24 hour  Intake   2170 ml  Output   3430 ml  Net  -1260 ml   There were no vitals filed for this visit.  PHYSICAL EXAM Filed Vitals:   02/02/15 0500 02/02/15 0600 02/02/15 0700 02/02/15 0800  BP: 164/57 170/63 148/48 177/48  Pulse: 61 62 62 73  Temp:    97.7 F (36.5 C)  TempSrc:    Oral  Resp: 15 13 12 17   SpO2: 94% 96% 98% 96%   General: Alert, oriented x3, no distress Head: no evidence of trauma, PERRL, EOMI, no exophtalmos or lid lag, no myxedema, no xanthelasma; normal ears, nose and oropharynx Neck: normal jugular venous pulsations and no hepatojugular reflux; brisk carotid pulses without delay and no  carotid bruits Chest: clear to auscultation, no signs of consolidation by percussion or palpation, normal fremitus, symmetrical and full respiratory excursions Cardiovascular: normal position and quality of the apical impulse, regular rhythm, normal first and second heart sounds, no rubs or gallops, no murmur Abdomen: no tenderness or distention, no masses by palpation, no abnormal pulsatility or arterial bruits, normal bowel sounds, no hepatosplenomegaly Extremities: no clubbing, cyanosis or edema; 2+ radial, ulnar and brachial pulses bilaterally; 2+ right femoral, posterior tibial and dorsalis pedis pulses; 2+ left femoral, posterior tibial and dorsalis pedis pulses; no subclavian or femoral bruits Neurological: grossly nonfocal  LABS  CBC No results for input(s): WBC, NEUTROABS, HGB, HCT, MCV, PLT in the last 72 hours. Basic Metabolic Panel  Recent Labs  01/31/15 0853 02/01/15 0233  NA 142 145  K 4.3 4.4  CL 107 110  CO2 23 26  GLUCOSE 236* 74  BUN 52* 54*  CREATININE 1.68* 1.32*  CALCIUM 8.1* 8.4    Radiology Studies Imaging results have been reviewed and No results found.  TELE NSR    ASSESSMENT AND PLAN  1. CAD/Acute inferolateral STEMI: She was admitted 01/27/15 with acute inferolateral STEMI. Cardiac cath per Dr. [name] with severe stenosis mid Circumflex and distal Circumflex treated with 2 DES. She is stable on ASA, Brilinta, statin, beta blocker.   2. Acute diastolic CHF: Improving.  In/out balance essentially zero since admission (She had been aggressively hydrated due to post contrast nephropathy.Developed CHF and has been diuresed).   3. Ischemic cardiomyopathy: LVEF=50-55% by LV gram 01/27/15.  4. HTN: BP high. Started Ace-inh yesterday, increase beta blocker to her home dose today  5. 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   6. Acute renal failure/CIN/acute kidney injury: post cath, likely due to contrast  induced nephropathy. Creatinine rapidly improving.   7. Polycythemia: Continue hyroxyurea  8. Anxiety: Continue Xanax prn.    Transfer to telemetry. May be ready for DC tomorrow. Needs PT.     Sanda Enloe, MD, [facility] [phone] office [phone] pager 02/02/2015 9:05 AM

## 2015-02-02 NOTE — Progress Notes (Addendum)
Hypoglycemic Event  CBG: 65  Treatment: 15 GM carbohydrate snack  Symptoms: None  Follow-up CBG: Time:0809 CBG Result:112  Possible Reasons for Event: Unknown  MD notified: Dr. Johnnette Gourd, Aalayah Riles J  Remember to initiate Hypoglycemia Order Set & complete

## 2015-02-03 LAB — BASIC METABOLIC PANEL
Anion gap: 9 (ref 5–15)
BUN: 42 mg/dL — AB (ref 6–23)
CALCIUM: 8.7 mg/dL (ref 8.4–10.5)
CO2: 29 mmol/L (ref 19–32)
CREATININE: 1.1 mg/dL (ref 0.50–1.10)
Chloride: 106 mmol/L (ref 96–112)
GFR calc Af Amer: 55 mL/min — ABNORMAL LOW (ref >90)
GFR, EST NON AFRICAN AMERICAN: 48 mL/min — AB (ref >90)
Glucose, Bld: 52 mg/dL — ABNORMAL LOW (ref 70–99)
Potassium: 4.4 mmol/L (ref 3.5–5.1)
Sodium: 144 mmol/L (ref 135–145)

## 2015-02-03 LAB — GLUCOSE, CAPILLARY
GLUCOSE-CAPILLARY: 59 mg/dL — AB (ref 70–99)
Glucose-Capillary: 87 mg/dL (ref 70–99)
Glucose-Capillary: 148 mg/dL — ABNORMAL HIGH (ref 70–99)
Glucose-Capillary: 253 mg/dL — ABNORMAL HIGH (ref 70–99)

## 2015-02-03 MED ORDER — INSULIN REGULAR HUMAN (CONC) 500 UNIT/ML ~~LOC~~ SOLN
20.0000 [IU] | Freq: Two times a day (BID) | SUBCUTANEOUS | Status: DC
  Filled 2015-02-03 (×2): qty 20

## 2015-02-03 MED ORDER — AMLODIPINE BESYLATE 5 MG PO TABS
5.0000 mg | ORAL_TABLET | Freq: Every day | ORAL | Status: DC
  Administered 2015-02-03 – 2015-02-04 (×2): 5 mg via ORAL
  Filled 2015-02-03 (×2): qty 1

## 2015-02-03 MED ORDER — INSULIN REGULAR HUMAN (CONC) 500 UNIT/ML ~~LOC~~ SOLN
70.0000 [IU] | Freq: Two times a day (BID) | SUBCUTANEOUS | Status: DC
  Administered 2015-02-03: 70 [IU] via SUBCUTANEOUS
  Filled 2015-02-03: qty 20

## 2015-02-03 MED ORDER — INSULIN REGULAR HUMAN (CONC) 500 UNIT/ML ~~LOC~~ SOLN
70.0000 [IU] | Freq: Two times a day (BID) | SUBCUTANEOUS | Status: DC
  Filled 2015-02-03: qty 20

## 2015-02-03 MED ORDER — OMEGA-3-ACID ETHYL ESTERS 1 G PO CAPS
1.0000 g | ORAL_CAPSULE | Freq: Two times a day (BID) | ORAL | Status: DC
  Administered 2015-02-03 – 2015-02-04 (×2): 1 g via ORAL
  Filled 2015-02-03 (×2): qty 1

## 2015-02-03 MED ORDER — LEVALBUTEROL HCL 0.63 MG/3ML IN NEBU
0.6300 mg | INHALATION_SOLUTION | Freq: Once | RESPIRATORY_TRACT | Status: DC
  Filled 2015-02-03: qty 3

## 2015-02-03 NOTE — Progress Notes (Signed)
Inpatient Diabetes Program Recommendations  AACE/ADA: New Consensus Statement on Inpatient Glycemic Control (2013)  Target Ranges:  Prepandial:   less than 140 mg/dL      Peak postprandial:   less than 180 mg/dL (1-2 hours)      Critically ill patients:  140 - 180 mg/dL   Results for PELAGIA, Amanda [name] (MRN [id number]) as of 02/03/2015 08:14  Ref. Range 02/02/2015 07:49 02/02/2015 08:09 02/02/2015 11:01 02/02/2015 17:20 02/02/2015 21:20 02/03/2015 06:52 02/03/2015 07:36  Glucose-Capillary Latest Ref Range: 70-99 mg/dL 65 (L) 112 (H) 109 (H) 173 (H) 156 (H) 59 (L) 87   Diabetes history: DM 2 Outpatient Diabetes medications: Patient reports on home med rec U-500 20 units BID Current orders for Inpatient glycemic control: U-500 80 units BID.  Inpatient Diabetes Program Recommendations Insulin - Basal: consider reducing dose of U-500 by 10-20% to prevent hypoglycemia. Patient has had hypoglycemia in the mornings. Please reduce U-500 dose to 64-70 units BID.   Thanks,  Tama Headings RN, MSN, [facility] Inpatient Diabetes Coordinator Team Pager [phone]

## 2015-02-03 NOTE — Progress Notes (Signed)
Subjective: No CP, SOB  Objective: Vital signs in last 24 hours: Temp:  [97.6 F (36.4 C)-98.6 F (37 C)] 97.8 F (36.6 C) (04/18 0500) Pulse Rate:  [46-76] 58 (04/18 0500) Resp:  [13-18] 18 (04/18 0500) BP: (112-177)/(43-97) 157/51 mmHg (04/18 0500) SpO2:  [93 %-97 %] 97 % (04/18 0500) Weight:  [223 lb 14.4 oz (101.56 kg)] 223 lb 14.4 oz (101.56 kg) (04/18 0500) Last BM Date: 02/01/15  Intake/Output from previous day: 04/17 0701 - 04/18 0700 In: 839 [P.O.:839] Out: 1425 [Urine:1425] Intake/Output this shift:    Medications Current Facility-Administered Medications  Medication Dose Route Frequency Provider Last Rate Last Dose  . acetaminophen (TYLENOL) tablet 650 mg  650 mg Oral Q4H PRN [name], MD   650 mg at 01/29/15 1132  . ALPRAZolam Duanne Moron) tablet 0.25 mg  0.25 mg Oral TID PRN Burnell Blanks, MD   0.25 mg at 02/02/15 2125  . antiseptic oral rinse (CPC / CETYLPYRIDINIUM CHLORIDE 0.05%) solution 7 mL  7 mL Mouth Rinse BID [name], MD   7 mL at 02/02/15 0924  . aspirin EC tablet 81 mg  81 mg Oral Daily [name], MD   81 mg at 02/02/15 7829  . atorvastatin (LIPITOR) tablet 80 mg  80 mg Oral q1800 [name], MD   80 mg at 02/02/15 1705  . escitalopram (LEXAPRO) tablet 5 mg  5 mg Oral Daily Brett Canales, PA-C   5 mg at 02/02/15 5621  . furosemide (LASIX) tablet 40 mg  40 mg Oral Daily Mihai Croitoru, MD   40 mg at 02/02/15 1705  . gabapentin (NEURONTIN) tablet 600 mg  600 mg Oral TID Skeet Simmer, RPH   600 mg at 02/02/15 2124  . hydroxyurea (HYDREA) capsule 1,500 mg  1,500 mg Oral Daily Brett Canales, PA-C   1,500 mg at 02/02/15 3086  . insulin regular human CONCENTRATED (HUMULIN R) 500 UNIT/ML injection 80 Units  80 Units Subcutaneous BID WC Burnell Blanks, MD   80 Units at 02/02/15 1810  . lisinopril (PRINIVIL,ZESTRIL) tablet 40 mg  40 mg Oral Daily Mihai Croitoru, MD   40 mg at 02/02/15 0921  . loperamide (IMODIUM) capsule 2 mg   2 mg Oral PRN [name], MD   2 mg at 01/29/15 1132  . metoprolol (LOPRESSOR) tablet 100 mg  100 mg Oral BID Sanda Stanish, MD   100 mg at 02/02/15 2124  . Neuropathy cream: Amitriptyline 2%, Lidocaine 2%, and Ketamine 1%  1 application Topical BID Brett Canales, PA-C   1 application at 57/84/69 2143  . nitroGLYCERIN (NITROSTAT) SL tablet 0.4 mg  0.4 mg Sublingual Q5 min PRN Larey Dresser, MD      . ondansetron [facility]) injection 4 mg  4 mg Intravenous Q6H PRN [name], MD      . ticagrelor [facility]) tablet 90 mg  90 mg Oral BID [name], MD   90 mg at 02/02/15 2125    PE: General appearance: alert, cooperative and no distress Lungs: Right Wheeze. Heart: regular rate and rhythm, S1, S2 normal, no murmur, click, rub or gallop Abdomen: +BS, nontender Extremities: edema Trace LEE Pulses: 2+ and symmetric Skin: Warm and dry.  Right groin nontender Neurologic: Grossly normal  Lab Results:  No results for input(s): WBC, HGB, HCT, PLT in the last 72 hours. BMET  Recent Labs  01/31/15 0853 02/01/15 0233 02/03/15 0532  NA  142 145 144  K 4.3 4.4 4.4  CL 107 110 106  CO2 23 26 29   GLUCOSE 236* 74 52*  BUN 52* 54* 42*  CREATININE 1.68* 1.32* 1.10  CALCIUM 8.1* 8.4 8.7   Lipid Panel     Component Value Date/Time   CHOL 168 01/27/2015 1320   TRIG 266* 01/27/2015 1320   HDL 27* 01/27/2015 1320   CHOLHDL 6.2 01/27/2015 1320   VLDL 53* 01/27/2015 1320   LDLCALC 88 01/27/2015 1320      Assessment/Plan  1. CAD/Acute inferolateral STEMI:  She was admitted 01/27/15 with acute inferolateral STEMI. Cardiac cath per Dr. [name] with severe stenosis mid Circumflex and distal Circumflex treated with 2 DES. She is stable on ASA, Brilinta, statin, beta blocker.   2. Acute diastolic CHF:  Improving. Net fluids:  -0.6L/-0.4L (She had been aggressively hydrated due to post contrast nephropathy.Developed CHF and has been diuresed).  SCr improving.  Continue PO lasix   3.  Ischemic cardiomyopathy:  LVEF=50-55% by LV gram 01/27/15.   4. HTN:  Overall BP still high. On Lisinopril 40, increased beta blocker to her home dose yesterday(100bid.)   Add amlodipine 51m today.   5. 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   6. Acute renal failure/CIN/acute kidney injury:  post cath, likely due to contrast induced nephropathy.  Creatinine continues to improve.    7. Polycythemia: Continue hyroxyurea  8. Anxiety: Continue Xanax prn.   9.  Hypoglycemia  Decrease Humulin to 70u BID Need to wean off O2.  PT eval today.  Wheezing on exam.  Will give Xopenex.  Maybe home tomorrow.    LOS: 7 days    HAGER, BRYAN PA-C 02/03/2015 7:45 AM   Patient seen and examined. Agree with assessment and plan. Feels well; no recurrent chest pain; 1 week following Hi risk inferolateral MI secondary to prox and distal LCX disease with IVR following spontaneous reperfusion prior to cath lab arrival. Cr is now normal at 1.16. Will add fish oil with mixed hyperlipidemia with elevated TG/VLDL to atorvastatin. Ambulate today; plan dc tomorrow.   TTroy Sine MD, FMem[facility]4/18/2016 10:38 AM

## 2015-02-03 NOTE — Progress Notes (Addendum)
Occupational Therapy Treatment Patient Details Name: Amanda [name] MRN: [id number] DOB: 1941-08-20 Today's Date: 02/03/2015    History of present illness 74 yo admitted 01/27/15 with acute inferolateral STEMI s/p cardiac cath . PMHx of ICM, DM, HTN   OT comments  This 74 yo female seen today to go over energy conservation and BADLs. Pt making progress slowly due to work of breathing (however O2 sats 97 at end of session on RA post ambulating to door and back to recliner). She will have adequate A/S at home.  Follow Up Recommendations  Home health OT;Supervision - Intermittent    Equipment Recommendations  Tub/shower seat;Tub/shower bench (may get on her own; needs a rollator)       Precautions / Restrictions Precautions Precautions: Fall Precaution Comments: monitor O2 sats Restrictions Weight Bearing Restrictions: No       Mobility Bed Mobility               General bed mobility comments: Pt up in recliner upon my arrival  Transfers Overall transfer level: Needs assistance Equipment used: Rolling walker (2 wheeled) Transfers: Sit to/from Stand Sit to Stand: Supervision         General transfer comment: S to ambulate arounnd bed to door and back to recliner on other side of bed. Educated pt on purse lipped breathing while ambulating        ADL Overall ADL's : Needs assistance/impaired                         Toilet Transfer: Supervision/safety;Ambulation;RW;BSC (over toilet)   Toileting- Clothing Manipulation and Hygiene: Supervision/safety;Sit to/from stand         General ADL Comments: I gave pt handout on energy conservation and went over it with her.      Vision                 Additional Comments: No change from baseline          Cognition   Behavior During Therapy: Anxious (due to breathing; however sats remained mid 90s) Overall Cognitive Status: Within Functional Limits for tasks assessed                                     Pertinent Vitals/ Pain       Pain Assessment: No/denies pain         Frequency Min 2X/week     Progress Toward Goals  OT Goals(current goals can now be found in the care plan section)  Progress towards OT goals: Progressing toward goals     Plan Discharge plan needs to be updated       End of Session Equipment Utilized During Treatment: Rolling walker   Activity Tolerance Patient tolerated treatment well   Patient Left in chair;with call bell/phone within reach   Nurse Communication          Time: 2336-1224 OT Time Calculation (min): 35 min  Charges: OT General Charges $OT Visit: 1 Procedure OT Treatments $Self Care/Home Management : 23-37 mins  Almon Register [phone] 02/03/2015, 10:41 AM

## 2015-02-03 NOTE — Progress Notes (Signed)
Physical Therapy Treatment Patient Details Name: Amanda [name] MRN: [id number] DOB: [birth year] Today's Date: 02-16-2015    History of Present Illness 74 yo admitted 01/27/15 with acute inferolateral STEMI s/p cardiac cath . PMHx of ICM, DM, HTN    PT Comments    Pt reports she has been having diarrhea this PM and feels weak. Only able to amb short distance in room.  Follow Up Recommendations  Home health PT;Supervision for mobility/OOB     Equipment Recommendations  Other (comment) (rollator)    Recommendations for Other Services       Precautions / Restrictions Precautions Precautions: Fall    Mobility  Bed Mobility                  Transfers Overall transfer level: Needs assistance Equipment used: 4-wheeled walker Transfers: Sit to/from Stand Sit to Stand: Min assist         General transfer comment: assist to bring hips up  Ambulation/Gait Ambulation/Gait assistance: Min assist Ambulation Distance (Feet): 25 Feet Assistive device: 4-wheeled walker Gait Pattern/deviations: Step-through pattern;Decreased step length - right;Decreased step length - left;Trunk flexed   Gait velocity interpretation: Below normal speed for age/gender General Gait Details: Verbal cues to not get too close to rollator. Assist for balance. Pt reported light headedness toward end of amb. BP checked and was stable and unchanged significantly from prior BP.   Stairs            Wheelchair Mobility    Modified Rankin (Stroke Patients Only)       Balance Overall balance assessment: Needs assistance Sitting-balance support: No upper extremity supported Sitting balance-Leahy Scale: Good     Standing balance support: Bilateral upper extremity supported Standing balance-Leahy Scale: Poor Standing balance comment: UE support                    Cognition Arousal/Alertness: Awake/alert Behavior During Therapy: Anxious Overall Cognitive Status: Within  Functional Limits for tasks assessed                      Exercises      General Comments        Pertinent Vitals/Pain Pain Assessment: No/denies pain    Home Living                      Prior Function            PT Goals (current goals can now be found in the care plan section) Progress towards PT goals: Not progressing toward goals - comment;Progressing toward goals (weakness after diarrhea this PM)    Frequency  Min 3X/week    PT Plan Current plan remains appropriate    Co-evaluation             End of Session   Activity Tolerance: Patient limited by fatigue Patient left: in chair;with call bell/phone within reach     Time: 1458-1515 PT Time Calculation (min) (ACUTE ONLY): 17 min  Charges:  $Gait Training: 8-22 mins                    G Codes:      Ryley Bachtel 2015/02/16, 3:36 PM  [facility] PT [phone]

## 2015-02-03 NOTE — Progress Notes (Addendum)
CARDIAC REHAB PHASE I   PRE:  Rate/Rhythm: 71 SR  BP:  Sitting: 170/74        SaO2: 96 2L  MODE:  Ambulation: 160 ft   POST:  Rate/Rhythm: 68 SR  BP:  Sitting: 148/47         SaO2: 96 RA  Pt assisted to [facility] prior to ambulation, had large BM. Pt on 2L upon entering room. PA note from today states to wean O2 as tolerated. Pt ambulated 160 ft 2x assist on RA, with rollator, steady gait, tolerated fair. Brief standing rest x4. Pt did c/o mild DOE (improved from previous), O2 sats in hallway 95% on RA. Would be ok with one assist in the future.  Pt would benefit from rolling walker or rollator at d/c. Pt left on RA, returned to chair, with call light in reach.  [phone]   Lenna Sciara, RN, BSN 02/03/2015 8:51 AM

## 2015-02-04 DIAGNOSIS — E1142 Type 2 diabetes mellitus with diabetic polyneuropathy: Secondary | ICD-10-CM | POA: Diagnosis not present

## 2015-02-04 DIAGNOSIS — R262 Difficulty in walking, not elsewhere classified: Secondary | ICD-10-CM | POA: Diagnosis not present

## 2015-02-04 DIAGNOSIS — I10 Essential (primary) hypertension: Secondary | ICD-10-CM | POA: Diagnosis not present

## 2015-02-04 DIAGNOSIS — R2681 Unsteadiness on feet: Secondary | ICD-10-CM | POA: Diagnosis not present

## 2015-02-04 DIAGNOSIS — M6281 Muscle weakness (generalized): Secondary | ICD-10-CM | POA: Diagnosis not present

## 2015-02-04 DIAGNOSIS — I2121 ST elevation (STEMI) myocardial infarction involving left circumflex coronary artery: Secondary | ICD-10-CM | POA: Diagnosis not present

## 2015-02-04 DIAGNOSIS — R079 Chest pain, unspecified: Secondary | ICD-10-CM | POA: Diagnosis not present

## 2015-02-04 DIAGNOSIS — E785 Hyperlipidemia, unspecified: Secondary | ICD-10-CM | POA: Diagnosis not present

## 2015-02-04 LAB — BASIC METABOLIC PANEL
ANION GAP: 10 (ref 5–15)
BUN: 37 mg/dL — ABNORMAL HIGH (ref 6–23)
CO2: 30 mmol/L (ref 19–32)
Calcium: 8.9 mg/dL (ref 8.4–10.5)
Chloride: 104 mmol/L (ref 96–112)
Creatinine, Ser: 1.07 mg/dL (ref 0.50–1.10)
GFR calc Af Amer: 57 mL/min — ABNORMAL LOW (ref >90)
GFR, EST NON AFRICAN AMERICAN: 49 mL/min — AB (ref >90)
Glucose, Bld: 109 mg/dL — ABNORMAL HIGH (ref 70–99)
POTASSIUM: 3.8 mmol/L (ref 3.5–5.1)
SODIUM: 144 mmol/L (ref 135–145)

## 2015-02-04 LAB — GLUCOSE, CAPILLARY
Glucose-Capillary: 69 mg/dL — ABNORMAL LOW (ref 70–99)
Glucose-Capillary: 93 mg/dL (ref 70–99)

## 2015-02-04 MED ORDER — NITROGLYCERIN 0.4 MG SL SUBL: 0.4000 mg | SUBLINGUAL_TABLET | SUBLINGUAL | Status: DC | PRN

## 2015-02-04 MED ORDER — METOPROLOL TARTRATE 75 MG PO TABS: 75.0000 mg | ORAL_TABLET | Freq: Two times a day (BID) | ORAL | Status: DC

## 2015-02-04 MED ORDER — TICAGRELOR 90 MG PO TABS: 90.0000 mg | ORAL_TABLET | Freq: Two times a day (BID) | ORAL | Status: DC

## 2015-02-04 MED ORDER — AMLODIPINE BESYLATE 10 MG PO TABS: 10.0000 mg | ORAL_TABLET | Freq: Every day | ORAL | Status: DC

## 2015-02-04 MED ORDER — OXYCODONE-ACETAMINOPHEN 5-325 MG PO TABS: 1.0000 | ORAL_TABLET | ORAL | Status: DC | PRN

## 2015-02-04 MED ORDER — FUROSEMIDE 40 MG PO TABS: 40.0000 mg | ORAL_TABLET | Freq: Every day | ORAL | Status: DC

## 2015-02-04 MED ORDER — OMEGA-3-ACID ETHYL ESTERS 1 G PO CAPS: 1.0000 g | ORAL_CAPSULE | Freq: Two times a day (BID) | ORAL | Status: DC

## 2015-02-04 MED ORDER — INSULIN REGULAR HUMAN (CONC) 500 UNIT/ML ~~LOC~~ SOLN: SUBCUTANEOUS | Status: DC

## 2015-02-04 MED ORDER — ATORVASTATIN CALCIUM 80 MG PO TABS: 80.0000 mg | ORAL_TABLET | Freq: Every day | ORAL | Status: DC

## 2015-02-04 MED ORDER — METOPROLOL TARTRATE 75 MG PO TABS
75.0000 mg | ORAL_TABLET | Freq: Two times a day (BID) | ORAL | Status: DC
Start: 2015-02-04 — End: 2015-02-04

## 2015-02-04 MED ORDER — METOPROLOL TARTRATE 50 MG PO TABS: 75.0000 mg | ORAL_TABLET | Freq: Two times a day (BID) | ORAL | Status: DC

## 2015-02-04 NOTE — Progress Notes (Signed)
Physical Therapy Treatment Patient Details Name: Amanda [name] MRN: [id number] DOB: 1941/07/21 Today's Date: 22-Feb-2015    History of Present Illness 74 yo admitted 01/27/15 with acute inferolateral STEMI s/p cardiac cath . PMHx of ICM, DM, HTN    PT Comments    Husband present for today's treatment and states he isn't available 24/7 due to working. Feel pt cannot manage at home alone due to weakness and decr activity tolerance. Recommend ST-SNF. Pt agreeable.  Follow Up Recommendations  SNF     Equipment Recommendations  Other (comment) (rollator)    Recommendations for Other Services       Precautions / Restrictions Precautions Precautions: Fall    Mobility  Bed Mobility                  Transfers Overall transfer level: Needs assistance Equipment used: 4-wheeled walker Transfers: Sit to/from Stand Sit to Stand: Min assist         General transfer comment: assist to bring hips up and verbal cues to scoot forward before standing  Ambulation/Gait Ambulation/Gait assistance: Min assist Ambulation Distance (Feet): 65 Feet (x 2) Assistive device: 4-wheeled walker Gait Pattern/deviations: Step-through pattern;Decreased step length - right;Decreased step length - left;Trunk flexed   Gait velocity interpretation: Below normal speed for age/gender General Gait Details: Verbal cues to stand more erect. Assist for balance   Stairs            Wheelchair Mobility    Modified Rankin (Stroke Patients Only)       Balance Overall balance assessment: Needs assistance Sitting-balance support: No upper extremity supported Sitting balance-Leahy Scale: Good     Standing balance support: Bilateral upper extremity supported Standing balance-Leahy Scale: Poor Standing balance comment: support of walker                    Cognition Arousal/Alertness: Awake/alert Behavior During Therapy: WFL for tasks assessed/performed Overall Cognitive Status:  Within Functional Limits for tasks assessed                      Exercises      General Comments        Pertinent Vitals/Pain Pain Assessment: No/denies pain    Home Living                      Prior Function            PT Goals (current goals can now be found in the care plan section)      Frequency  Min 3X/week    PT Plan Discharge plan needs to be updated    Co-evaluation             End of Session   Activity Tolerance: Patient limited by fatigue Patient left: in chair;with call bell/phone within reach     Time: 3094-0768 PT Time Calculation (min) (ACUTE ONLY): 19 min  Charges:  $Gait Training: 8-22 mins                    G Codes:      Samaiya Awadallah 22-Feb-2015, 11:42 AM  [facility] Marker PT [phone]

## 2015-02-04 NOTE — Progress Notes (Signed)
Subjective: No specific complaints  Objective: Vital signs in last 24 hours: Temp:  [97.7 F (36.5 C)-98.7 F (37.1 C)] 98.5 F (36.9 C) (04/19 0500) Pulse Rate:  [50-72] 50 (04/19 0500) Resp:  [18-20] 20 (04/19 0500) BP: (133-145)/(59-102) 145/59 mmHg (04/19 0500) SpO2:  [94 %] 94 % (04/19 0500) Weight:  [219 lb 9.6 oz (99.61 kg)] 219 lb 9.6 oz (99.61 kg) (04/19 0500) Last BM Date: 02/03/15  Intake/Output from previous day: 04/18 0701 - 04/19 0700 In: 740 [P.O.:740] Out: 1100 [Urine:1100] Intake/Output this shift: Total I/O In: 480 [P.O.:480] Out: 300 [Urine:300]  Medications Current Facility-Administered Medications  Medication Dose Route Frequency Provider Last Rate Last Dose  . acetaminophen (TYLENOL) tablet 650 mg  650 mg Oral Q4H PRN [name], MD   325 mg at 02/03/15 2222  . ALPRAZolam Duanne Moron) tablet 0.25 mg  0.25 mg Oral TID PRN Burnell Blanks, MD   0.25 mg at 02/03/15 2222  . amLODipine (NORVASC) tablet 5 mg  5 mg Oral Daily Brett Canales, PA-C   5 mg at 02/04/15 0830  . antiseptic oral rinse (CPC / CETYLPYRIDINIUM CHLORIDE 0.05%) solution 7 mL  7 mL Mouth Rinse BID [name], MD   7 mL at 02/04/15 0831  . aspirin EC tablet 81 mg  81 mg Oral Daily [name], MD   81 mg at 02/04/15 0830  . atorvastatin (LIPITOR) tablet 80 mg  80 mg Oral q1800 [name], MD   80 mg at 02/03/15 1903  . escitalopram (LEXAPRO) tablet 5 mg  5 mg Oral Daily Brett Canales, PA-C   5 mg at 02/04/15 0830  . furosemide (LASIX) tablet 40 mg  40 mg Oral Daily Mihai Croitoru, MD   40 mg at 02/04/15 0830  . gabapentin (NEURONTIN) tablet 600 mg  600 mg Oral TID Skeet Simmer, RPH   600 mg at 02/04/15 0830  . hydroxyurea (HYDREA) capsule 1,500 mg  1,500 mg Oral Daily Brett Canales, PA-C   1,500 mg at 02/04/15 1007  . insulin regular human CONCENTRATED (HUMULIN R) 500 UNIT/ML injection 70 Units  70 Units Subcutaneous BID WC Brett Canales, PA-C   70 Units at 02/03/15 1903   . levalbuterol (XOPENEX) nebulizer solution 0.63 mg  0.63 mg Nebulization Once Brett Canales, PA-C   0.63 mg at 02/03/15 9244  . lisinopril (PRINIVIL,ZESTRIL) tablet 40 mg  40 mg Oral Daily Mihai Croitoru, MD   40 mg at 02/04/15 0830  . loperamide (IMODIUM) capsule 2 mg  2 mg Oral PRN [name], MD   2 mg at 01/29/15 1132  . metoprolol (LOPRESSOR) tablet 100 mg  100 mg Oral BID Mihai Croitoru, MD   100 mg at 02/04/15 0830  . Neuropathy cream: Amitriptyline 2%, Lidocaine 2%, and Ketamine 1%  1 application Topical BID Brett Canales, PA-C   1 application at 62/86/38 0831  . nitroGLYCERIN (NITROSTAT) SL tablet 0.4 mg  0.4 mg Sublingual Q5 min PRN Larey Dresser, MD      . omega-3 acid ethyl esters (LOVAZA) capsule 1 g  1 g Oral BID [name], MD   1 g at 02/04/15 0831  . ondansetron (ZOFRAN) injection 4 mg  4 mg Intravenous Q6H PRN [name], MD      . ticagrelor [facility]) tablet 90 mg  90 mg Oral BID [name], MD   90 mg at 02/04/15 0830  PE: General appearance: alert, cooperative and no distress Lungs: decreased BS throughout.  No wheeze today.  Heart: regular rate and rhythm, S1, S2 normal, no murmur, click, rub or gallop Extremities: No LEE Pulses: 2+ and symmetric Skin: Warm and dry. Neurologic: Grossly normal  Lab Results:  No results for input(s): WBC, HGB, HCT, PLT in the last 72 hours. BMET  Recent Labs  02/03/15 0532 02/04/15 0411  NA 144 144  K 4.4 3.8  CL 106 104  CO2 29 30  GLUCOSE 52* 109*  BUN 42* 37*  CREATININE 1.10 1.07  CALCIUM 8.7 8.9     Assessment/Plan  Principal Problem:   ST elevation myocardial infarction (STEMI) involving left circumflex coronary artery in recovery phase Active Problems:   Hyperlipidemia   POLYCYTHEMIA   Type 2 diabetes mellitus with neurological manifestations, uncontrolled   Essential (primary) hypertension   Diabetic peripheral neuropathy associated with type 2 diabetes mellitus   ST elevation  myocardial infarction (STEMI) of inferior wall, initial episode of care   Idioventricular rhythm   Acute diastolic heart failure  1. CAD/Acute inferolateral STEMI:  She was admitted 01/27/15 with acute inferolateral STEMI. Cardiac cath per Dr. [name] with severe stenosis mid Circumflex and distal Circumflex treated with 2 DES. She is stable on ASA, Brilinta, statin, beta blocker. EF 50-55%.  2. Acute diastolic CHF:  Improving. Net fluids: -0.4L/-0.7L (She had been aggressively hydrated due to post contrast nephropathy.Developed CHF and has been diuresed).  SCr improving.  Continue PO lasix   3. Ischemic cardiomyopathy: LVEF=50-55% by LV gram 01/27/15.   4. HTN:  BP better.  Amlodipine 89m added yesterday.  On Lisinopril 40, lopressor 100bid    5. 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   6. Acute renal failure/CIN/acute kidney injury:  post cath, likely due to contrast induced nephropathy.  Creatinine continues to improve.   7. Polycythemia: Continue hyroxyurea  8. Anxiety: Continue Xanax prn.   9. Hypoglycemia Decrease Humulin to 70u BID  10.  Bradycardia Sinus brady in the 40's while awake. I think we should back off on her lopressor and increase amlodipine if better BP control is needed.    Ambulated with cardiac rehab(see note).  O2 sats on RA 95%.  [facility], however, she will not have 24 hour supervision as her husband works 2-3 days per weak.   Agreeable to short term SNF prior to going home.    LOS: 8 days    HAGER, BRYAN PA-C 02/04/2015 11:00 AM  Patient seen and examined. Agree with assessment and plan. With bradycardia agree with reduction in BB dose to 75 mg bid and titrate amlodipine. For short term SNF for rehab. I see in office for f/u.   TTroy Sine MD, [facility]4/19/2016 12:27 PM

## 2015-02-04 NOTE — Discharge Summary (Signed)
Physician Discharge Summary      Cardiologist:  [name] Patient ID: Amanda [name] MRN: [id number] DOB/AGE: 01-23-1941 74 y.o.  Admit date: 01/27/2015 Discharge date: 02/04/2015  Admission Diagnoses:  STEMI  Discharge Diagnoses:  Principal Problem:   ST elevation myocardial infarction (STEMI) involving left circumflex coronary artery in recovery phase Active Problems:   Hyperlipidemia   POLYCYTHEMIA   Type 2 diabetes mellitus with neurological manifestations, uncontrolled   Essential (primary) hypertension   Diabetic peripheral neuropathy associated with type 2 diabetes mellitus   ST elevation myocardial infarction (STEMI) of inferior wall, initial episode of care   Idioventricular rhythm   Acute diastolic heart failure   Discharged Condition: stable  Hospital Course:   The patient is a 74 yo obese female with a history of HTN, HLD, DM-uncontrolled, depression, polycythemia, cirrhosis, skin cancer, peripheral neuropathy. She reports developing chest pain around 0900-0930hrs this morning. She says it felt like one of her pills was stuck in her throat. She tried drinking water and eating something to move it down. It became severe around 1100hrs. It radiated to her back and she vomited. She denies shortness of breath, orthopnea, dizziness, PND, cough, congestion, abdominal pain, hematochezia, melena, lower extremity edema, claudication. She was admitted and taken for emergent left heart catheterization which revealed occlusion of the circumflex artery.  She underwent successful acute percutaneous coronary intervention left circumflex vessel with the 90-95% stenosis being reduced to 0% after insertion of a Xience Alpine 3.023 mm DES stent postdilated 3.15 mm, and the distal circumflex 80% stenosis been reduced to 0% treated with 2.518 mm Xience Alpine DES stent dilated to 2.57 mm.  she also had to have a temporary pacemaker wire placed due to a slow idioventricular rhythm.   She was started on aspirin and Brilinta. Metoprolol was restarted. 100 mg twice daily. This was later decreased to 75 mg daily due to persistent bradycardia in the 40s.  Echocardiogram revealed ejection fraction of 50-55%, possible mild posterior lateral wall hypokinesis, diastolic dysfunction, left atrium moderately dilated, calcified mass in the dome of the right atrium and abnormal posterior lateral septal strain.  On 01/28/2015 in the morning patient was having increased work of breathing wheezing and tachypnea. She was put on 2 L of oxygen through nasal cannula with sats of 88%. This was increased to 6 L/m.  IV fluids were discontinued. She was given 20 mg of IV Lasix.   Later changed to by mouth Lasix 40 mg daily.  Due to acute renal failure post cath ACE inhibitor was stopped later resumed once she had improvement.  Due to persistent low blood sugars in the morning hemoglobin was decreased to 70 units twice daily and will be decreased to 66 units at discharge.  On April 15 patient was to Make stating that she couldn't breathe. She was given 40 mg of Lasix nebulizer treatment and chest x-ray. Ativan was also administered. Chest x-ray showed worsening edema. She was placed on BiPAP. She was transferred back to the ICU.  She ultimately had improvement with diuresis. She worked with cardiac rehabilitation and with physical therapy who recommended short-term skilled nursing as patient will not have 24-hour supervision at home because her husband still works. She was able to ambulate without oxygen and maintain O2 saturations of 95%. Amlodipine was added for better blood pressure control and was later titrated to 10 mg daily.  The patient was seen by Dr. [name] who felt she was stable for DC L nursing facility.   Consults:  Cardiac rehabilitation, physical therapy, diabetes coronary  Significant Diagnostic Studies:  Echocardiogram  Study Conclusions  - Left ventricle: possbile mild posterior lateral wall  hypokinesis. The cavity size was normal. Wall thickness was increased in a pattern of moderate LVH. Systolic function was normal. The estimated ejection fraction was in the range of 50% to 55%. Doppler parameters are consistent with elevated ventricular end-diastolic filling pressure. - Mitral valve: Calcified annulus. - Left atrium: The atrium was moderately dilated. - Right atrium: Calcified mass in dome of RA seen on apical views not seen on subcostal. Doubt clinical signficiance. - Atrial septum: No defect or patent foramen ovale was identified. - Impressions: Abnormal posterior lateral and septal strain  Impressions:  - Abnormal posterior lateral and septal strain   EMERGENT CARDIAC CATHETERIZATION/PERCUTANEOUS CORONAY INTERVENTION     HISTORY:   Amanda [name] is a 74 y.o. female history of hypertension, type 2 diabetes mellitus, polycythemia, and significant peripheral neuropathy. She developed new onset chest pain this morning after eating breakfast associated with nausea vomiting and diaphoresis. When EMS arrived at her house there was 3 mm of ST segment elevation inferiorly and in leads V5 and 6 with 2 mm of ST depression anteriorly. During her transport by different EMS she developed an idioventricular rhythm with rates in the 60s.. She was treated with amiodarone bolus 2. She was brought emergently to the cardiac catheterization laboratory.   PROCEDURE: Left heart catheterization: coronary angiography, left ventriculography, temporary pacemaker, percutaneous coronary intervention with PTCA/TES stenting to the mid and distal left circumflex coronary artery   The patient was brought to the [facility] cardiac catherization laboratory after presenting with an inferior ST segment elevation MI with subsequent development of idioventricular rhythm in transient to the hospital. She was brought emergently to the catheterization laboratory. On arrival to the lab she was  still having chest pain and she was in idioventricular rhythm. Her right femoral artery was punctured anteriorly and a 6 French arterial sheath was inserted without difficulty. Her right femoral vein was entered anteriorly and a 6 French venous sheath was inserted. A temporary transvenous pacemaker was inserted into the venous sheath and advanced to the RV apex with excellent pacemaker capture and threshold. Diagnostic catheterization was done with Judkins 4 left and right coronary catheters. With the demonstration of high-grade ulcerated plaque in the left circumflex vessel was made to perform intervention. Angiomax bolus plus infusion was administered and the patient received 180 mg of oral Brilnta for antiplatelet therapy. A 6 French XB 3.5 guide was inserted and a primary wire was advanced into the circumflex on the mid high-grade stenosis and beyond a distal stenosis. A 2.515 mm track balloon was used for dilatation at both sites. A signed Alpine 2.518 mm DES stent was then inserted and advanced to the distal lesion and was deployed at 10 and 12 atm. A 3.023 mm Alpine stent was inserted and deployed at the mid lesion at 12 and 13 atm. A 2.7515 mm and see you for balloon was then inserted in attempt to post-dilate the distal stent. However, the balloon Hanging up the stent strut and consequently was never able to get into the stented segment. This was then removed. An Brinnon Euphora 3.2520 mm balloon was used for post stent dilatation at the mid site up to 12 atm. Another attempt was made to insert a 2.75 noncompliant balloon distally but again there was inability to cross into the stent due to the angle. Since there was excellent stent apposition  and the initial stent had been dilated distally up to 2.57 no further attempt at post dilatation was done. Angiography confirmed an excellent result. With reestablishment of flow the patient's rhythm became sinus rhythm. ST segment elevation resolved. She was chest  pain-free. A 5 French pigtail catheter was inserted and left ventriculography was performed. Arterial sheath was sutured in place. Plans are to continue bivalirudin post procedure for up to 4 hours in the ST segment elevation MI setting. The temporary pacemaker was removed. She left the catheterization laboratory with stable hemodynamics chest pain-free.  HEMODYNAMICS:  Central Aorta: 140/65  Left Ventricle: 140/64  ANGIOGRAPHY:  Left main: Short normal vessel which immediately bifurcated into an LAD and left circumflex vessel.  LAD: Moderate size vessel that gave rise to a proximal diagonal vessel and several septal perforating arteries. The vessel was free of significant disease.  Left circumflex: moderate size vessel that gave rise to small first marginal branch at a percent mid stenosis in the small branch. The circumflex beyond this had diffuse 90-95% ulcerated plaque in its mid segment. Distally there was diffuse 80% stenosis in the region of the small branch takeoff.   Right coronary artery: Large caliber vessel which had mild luminal irregularity of 20% in its mid segment. The vessel supplies a large PDA vessel was otherwise free of significant disease.  Left ventriculography revealed mild acute LV dysfunction with an ejection fraction of 45-50%. There was mild focal mid inferior and focal anterolateral hypocontractility concordant with left circumflex disease.  Following PCI to the left circumflex vessel with PTCA/DES stenting and insertion of a 3.023 mm Xience alpine DES stent in the mid circumflex postdilated to 3.15 mm the 95% stenosis was reduced to 0%. The distal 80% stenosis was reduced to 0% with insertion of a 2.518 mm Xience alpine DES stent dilated 2.57 mm.   IMPRESSION:  Acute high risk ST segment elevation inferolateral myocardial infarction secondary to circumflex occlusion with development of idioventricular rhythm with probable spontaneous reperfusion en route  by [facility].  Predominent single vessel coronary artery disease with 90-95% ulcerated plaque in the mid AV groove circumflex with distal 80% circumflex stenosis, and 80% stenosis in a very small OM1 vessel; normal LAD; mild 20% irregularity in the mid RCA.  Temporary pacemaker for slow idioventricular rhythm.  Successful acute percutaneous coronary intervention left circumflex vessel with the 90-95% stenosis being reduced to 0% after insertion of a Xience Alpine 3.023 mm DES stent postdilated 3.15 mm, and the distal circumflex 80% stenosis been reduced to 0% treated with 2.518 mm Xience Alpine DES stent dilated to 2.57 mm.  RECOMMENDATION:  Patient will continue with antiplatelet therapy for minimum of 1 year but probably longer with her diabetes mellitus. Aggressive lipid-lowering therapy, blood pressure control, be necessary. She will be hydrated postprocedure with underlying renal insufficiency.    [name], MD, [facility] 01/27/2015  Treatments: See above  Discharge Exam: Blood pressure 145/59, pulse 50, temperature 98.5 F (36.9 C), temperature source Oral, resp. rate 20, height 5' 4" (1.626 m), weight 219 lb 9.6 oz (99.61 kg), SpO2 91 %.   Disposition: 01-Home or Self Care      Discharge Instructions    Diet - low sodium heart healthy    Complete by:  As directed      Discharge instructions    Complete by:  As directed   Monitor your weight every morning.  If you gain 3 pounds in 24 hours, or 5 pounds in a week,  call the office for instructions.     Increase activity slowly    Complete by:  As directed             Medication List    STOP taking these medications        hydrochlorothiazide 12.5 MG capsule  Commonly known as:  MICROZIDE     ibuprofen 200 MG tablet  Commonly known as:  ADVIL,MOTRIN     nystatin-triamcinolone ointment  Commonly known as:  MYCOLOG     pravastatin 40 MG tablet  Commonly known as:  PRAVACHOL      TAKE these medications          acetaminophen-codeine 300-30 MG per tablet  Commonly known as:  TYLENOL #3  Take one or two tablets every 6 hours prn pain     amLODipine 10 MG tablet  Commonly known as:  NORVASC  Take 1 tablet (10 mg total) by mouth daily.  Start taking on:  02/05/2015     aspirin EC 81 MG tablet  Take 81 mg by mouth daily.     atorvastatin 80 MG tablet  Commonly known as:  LIPITOR  Take 1 tablet (80 mg total) by mouth daily at 6 PM.     B-D INS SYR ULTRAFINE 1CC/31G 31G X 5/16" 1 ML Misc  Generic drug:  Insulin Syringe-Needle U-100     cetaphil lotion  Apply topically daily.     diphenhydrAMINE 25 mg capsule  Commonly known as:  BENADRYL  Take 25 mg by mouth every 6 (six) hours as needed.     escitalopram 5 MG tablet  Commonly known as:  LEXAPRO  Take 1 tablet (5 mg total) by mouth daily.     furosemide 40 MG tablet  Commonly known as:  LASIX  Take 1 tablet (40 mg total) by mouth daily.     gabapentin 100 MG capsule  Commonly known as:  NEURONTIN  Take up to 6 tablets 3x a day as needed for pain     glucose blood test strip  Commonly known as:  BAYER CONTOUR NEXT TEST  Use to test blood sugar 3 times daily as instructed. Dx code: 250.62     hydrocortisone 2.5 % cream  Apply 1 application topically 2 (two) times daily.     hydroxyurea 500 MG capsule  Commonly known as:  HYDREA  Take 3 capsules (1,500 mg total) by mouth daily. May take with food to minimize GI side effects.     insulin regular human CONCENTRATED 500 UNIT/ML Soln injection  Commonly known as:  HUMULIN R  Inject under skin 66 units before breakfast and dinner     ketoconazole 2 % cream  Commonly known as:  NIZORAL  Apply topically daily. Apply to rash daily for 10 days     lisinopril 40 MG tablet  Commonly known as:  PRINIVIL,ZESTRIL  TAKE 1 TABLET (40 MG TOTAL) BY MOUTH DAILY.     loperamide 2 MG tablet  Commonly known as:  IMODIUM A-D  Take 2 mg by mouth daily.     Metoprolol Tartrate 75 MG  Tabs  Take 75 mg by mouth 2 (two) times daily.     nitroGLYCERIN 0.4 MG SL tablet  Commonly known as:  NITROSTAT  Place 1 tablet (0.4 mg total) under the tongue every 5 (five) minutes as needed for chest pain.     NONFORMULARY OR COMPOUNDED ITEM  - Neuropathy cream: mix Amitriptyline 2%, Lidocaine 2%, and Ketamine 1% -  apply on feet 2x a day  - [facility].     nystatin cream  Commonly known as:  MYCOSTATIN  Apply 1 application topically 2 (two) times daily.     omega-3 acid ethyl esters 1 G capsule  Commonly known as:  LOVAZA  Take 1 capsule (1 g total) by mouth 2 (two) times daily.     ondansetron 8 MG tablet  Commonly known as:  ZOFRAN  Take 8 mg by mouth every 8 (eight) hours as needed for nausea or vomiting.     oxyCODONE-acetaminophen 5-325 MG per tablet  Commonly known as:  PERCOCET/ROXICET  Take 1 tablet by mouth every 4 (four) hours as needed for moderate pain.     PROBIOTIC DAILY PO  Take by mouth daily.     saccharomyces boulardii 250 MG capsule  Commonly known as:  FLORASTOR  Take 1 capsule (250 mg total) by mouth 2 (two) times daily.     ticagrelor 90 MG Tabs tablet  Commonly known as:  BRILINTA  Take 1 tablet (90 mg total) by mouth 2 (two) times daily.       Follow-up Information    Follow up with Lyda Jester, PA-C On 02/26/2015.   Specialty:  Cardiology   Why:  8:30 AM   Contact information:   [address] [place] [address] [phone]      Greater than 30 minutes was spent completing the patient's discharge.    SignedTarri Fuller [place] 02/04/2015, 2:19 PM

## 2015-02-04 NOTE — Care Management Note (Signed)
    Page 1 of 1   02/04/2015     1:48:14 PM CARE MANAGEMENT NOTE 02/04/2015  Patient:  Amanda [name], Amanda [name]   Account Number:  [id number]  Date Initiated:  01/28/2015  Documentation initiated by:  [facility]  Subjective/Objective Assessment:   Admitted with STEMI - emergent cath     Action/Plan:   Anticipated DC Date:  01/31/2015   Anticipated DC Plan:  [place] referral  Clinical Social Worker      DC Planning Services  CM consult      Choice offered to / List presented to:             Status of service:  Completed, signed off Medicare Important Message given?  YES (If response is "NO", the following Medicare IM given date fields will be blank) Date Medicare IM given:  02/03/2015 Medicare IM given by:  GRAVES-BIGELOW,BRENDA Date Additional Medicare IM given:   Additional Medicare IM given by:    Discharge Disposition:  [place]  Per UR Regulation:  Reviewed for med. necessity/level of care/duration of stay  If discussed at [place] of Stay Meetings, dates discussed:   02/04/2015    Comments:  02/04/15.  Amanda Quinones, RN, BSN [phone].  Pt and husband agree that pt will not have 24 hour supervison at home, pt has agreed to SNF.    ContactAshantae, Amanda [name] Spouse [phone]  [phone]  02-03-15 Amanda Krauss, RN BSN [phone] Tweaking medication for BP. [name] once medically stable.  01-28-15 10am Amanda [name] Amanda [name] card given.

## 2015-02-04 NOTE — Clinical Social Work Psychosocial (Signed)
     Clinical Social Work Department BRIEF PSYCHOSOCIAL ASSESSMENT 02/04/2015  Patient:  Amanda [name], Amanda [name]     Account Number:  [id number]     Admit date:  01/27/2015  Clinical Social Worker:  Adair Laundry  Date/Time:  02/04/2015 12:29 PM  Referred by:  Physician  Date Referred:  02/04/2015 Referred for  SNF Placement   Other Referral:   Interview type:  Patient Other interview type:   Spoke with pt and pt husband at bedside    PSYCHOSOCIAL DATA Living Status:  HUSBAND Admitted from facility:   Level of care:   Primary support name:  Charlen Bakula Primary support relationship to patient:  SPOUSE Degree of support available:   Pt has good support    CURRENT CONCERNS Current Concerns  Post-Acute Placement   Other Concerns:    SOCIAL WORK ASSESSMENT / PLAN CSW made aware pt is ready for dc but recommendation has changed to SNF since pt will not have supervision. CSW spoke with pt and pt husband at bedside. Pt with flat affect and hard to engage. After agreeing to SNF pt participated very little in assessment. Pt husband more active in conversation.  CSW discussed SNF referral process. Pt and pt husband not familiar with facilities and have no preferences. They are agreeable to referral being sent to all [facility]. CSW did also notify of potential for dc today and need for quick decision after bed offers are presented. Pt and pt husband okay with this and understanding.   Assessment/plan status:  Psychosocial Support/Ongoing Assessment of Needs Other assessment/ plan:   Information/referral to community resources:   SNF list to be provided with bed offers    PATIENTS/FAMILYS RESPONSE TO PLAN OF CARE: Pt and pt husband agreeable to SNF.      [place], [place]

## 2015-02-04 NOTE — Progress Notes (Signed)
CSW (Clinical Social Worker) prepared pt dc packet and placed with shadow chart. CSW arranged non-emergent ambulance transport. Pt, pt family, pt nurse, and facility informed. CSW signing off.  Donny Heffern, LCSWA [phone]  

## 2015-02-04 NOTE — Clinical Social Work Placement (Addendum)
    Clinical Social Work Department CLINICAL SOCIAL WORK PLACEMENT NOTE 02/04/2015  Patient:  Amanda [name], Amanda [name]  Account Number:  [id number] Admit date:  01/27/2015  Clinical Social Worker:  Berton Mount, Latanya Presser  Date/time:  02/04/2015 12:32 PM  Clinical Social Work is seeking post-discharge placement for this patient at the following level of care:   SKILLED NURSING   (*CSW will update this form in Epic as items are completed)   02/04/2015  Patient/family provided with [place] Department of Clinical Social Work's list of facilities offering this level of care within the geographic area requested by the patient (or if unable, by the patient's family).  02/04/2015  Patient/family informed of their freedom to choose among providers that offer the needed level of care, that participate in Medicare, Medicaid or managed care program needed by the patient, have an available bed and are willing to accept the patient.  02/04/2015  Patient/family informed of [facility]' ownership interest in [facility], as well as of the fact that they are under no obligation to receive care at this facility.  PASARR submitted to EDS on 02/04/2015 PASARR number received on 02/04/2015  FL2 transmitted to all facilities in geographic area requested by pt/family on  02/04/2015 FL2 transmitted to all facilities within larger geographic area on 02/04/2015  Patient informed that his/her managed care company has contracts with or will negotiate with  certain facilities, including the following:     Patient/family informed of bed offers received:  02/04/2015 Patient chooses bed at [place] Physician recommends and patient chooses bed at    Patient to be transferred to [facility] on  02/04/2015 Patient to be transferred to facility by [facility] Patient and family notified of transfer on 02/04/2015 Name of family member notified:  Alphia Moh  The following physician request were  entered in Epic: Physician Request  Please sign FL2.    Additional CommentsBerton Mount, [place]

## 2015-02-04 NOTE — Progress Notes (Signed)
CARDIAC REHAB PHASE I   PRE:  Rate/Rhythm: 95 SB  BP:  Supine:   Sitting: 128/60  Standing:    SaO2: 92%RA  MODE:  Ambulation: 160 ft   POST:  Rate/Rhythm: 58 SB  BP:  Supine:   Sitting: 137/47  Standing:    SaO2: 96%RA 1347-1430 Pt walked 80 ft on RA with rollator and sat down to rest with asst x 2 and then walked back 80 ft. Tolerated well. To recliner after walk. Pt stated for discharge. Re enforced importance of brilinta with stent. Reviewed NTG use, ex ed and encouraged low sodium. Discussed CRP 2 and pt gave permission for referral to [place] program.   Graylon Good, RN BSN  02/04/2015 2:25 PM

## 2015-02-04 NOTE — Progress Notes (Signed)
Inpatient Diabetes Program Recommendations  AACE/ADA: New Consensus Statement on Inpatient Glycemic Control (2013)  Target Ranges:  Prepandial:   less than 140 mg/dL      Peak postprandial:   less than 180 mg/dL (1-2 hours)      Critically ill patients:  140 - 180 mg/dL   Results for Amanda [name], Amanda [name] (MRN [id number]) as of 02/04/2015 08:33  Ref. Range 02/04/2015 07:47  Glucose-Capillary Latest Ref Range: 70-99 mg/dL 69 (L)    Diabetes history: DM 2 Outpatient Diabetes medications: Patient reports on home med rec U-500 20 units BID Current orders for Inpatient glycemic control: U-500 70 units BID.  Inpatient Diabetes Program Recommendations Insulin - Basal: Patient's glucose was 69 mg/dl this am. If patient is not discharged, please reduce U-500 insulin slightly to 66 units BID.  Thanks,  Tama Headings RN, MSN, [facility] Inpatient Diabetes Coordinator Team Pager [phone]

## 2015-02-06 ENCOUNTER — Telehealth: Payer: Self-pay | Admitting: Cardiovascular Disease

## 2015-02-06 ENCOUNTER — Other Ambulatory Visit: Payer: Self-pay | Admitting: Cardiovascular Disease

## 2015-02-06 DIAGNOSIS — I2119 ST elevation (STEMI) myocardial infarction involving other coronary artery of inferior wall: Secondary | ICD-10-CM

## 2015-02-06 DIAGNOSIS — I5031 Acute diastolic (congestive) heart failure: Secondary | ICD-10-CM

## 2015-02-06 DIAGNOSIS — I10 Essential (primary) hypertension: Secondary | ICD-10-CM

## 2015-02-06 DIAGNOSIS — E785 Hyperlipidemia, unspecified: Secondary | ICD-10-CM

## 2015-02-06 MED ORDER — TICAGRELOR 90 MG PO TABS: 90.0000 mg | ORAL_TABLET | Freq: Two times a day (BID) | ORAL | Status: DC

## 2015-02-06 MED ORDER — NITROGLYCERIN 0.4 MG SL SUBL: 0.4000 mg | SUBLINGUAL_TABLET | SUBLINGUAL | Status: DC | PRN

## 2015-02-06 NOTE — Telephone Encounter (Signed)
Please call him asap, husband is calling back again. He is trying to get his wife out of Rehab.

## 2015-02-06 NOTE — Telephone Encounter (Signed)
°  1. Which medications need to be refilled? Brilinta and nitroglycerin  2. Which pharmacy is medication to be sent to?[phone]  3. Do they need a 30 day or 90 day supply? 30   4. Would they like a call back once the medication has been sent to the pharmacy? yes

## 2015-02-06 NOTE — Telephone Encounter (Signed)
Patient's husband took her out of rehab at [facility].   Patient's husband is requesting order for home health physical therapy - this is apparently covered under patient's husband's insurance (vs cardiac rehab at [facility])  Rx(s) sent to pharmacy electronically.  Message routed to Dr. [name] & Mariann Laster to advise on PT with home health

## 2015-02-06 NOTE — Telephone Encounter (Signed)
New message      Pt had 2 stents placed last week.  She is at [facility] rehab.  Husband is signing her out of that facility.  He want to know if Dr [name] will ok [facility] her home and do rehab?

## 2015-02-07 ENCOUNTER — Telehealth: Payer: Self-pay | Admitting: *Deleted

## 2015-02-07 MED ORDER — OMEGA-3-ACID ETHYL ESTERS 1 G PO CAPS: 1.0000 g | ORAL_CAPSULE | Freq: Two times a day (BID) | ORAL | Status: DC

## 2015-02-07 NOTE — Telephone Encounter (Signed)
Faxed cardiac rehab phase II order to [facility].

## 2015-02-07 NOTE — Telephone Encounter (Signed)
Returned call to patient's husband Dr.Kelly advised ok to order home physical therapy.

## 2015-02-07 NOTE — Telephone Encounter (Signed)
Returned call to patient's husband.He stated he had to bring wife home from [employer].Stated she had a bad experience.Stated she never received any rehab,was given the wrong insulin,wrong diet.Stated he wanted Dr.Kelly to give a order for home rehab.Also needs refill for Lovaza.Refill sent to pharmacy.Will speak to Dr.Kelly for home therapy order and call you back.

## 2015-02-07 NOTE — Telephone Encounter (Signed)
Please call,he wants pt scheduled to have Rehab at home please.

## 2015-02-10 NOTE — Telephone Encounter (Signed)
Message addressed on 4/22 in EPIC

## 2015-02-10 NOTE — Telephone Encounter (Signed)
Pt is post mi; should have cardiac rehab ideally

## 2015-02-13 ENCOUNTER — Telehealth: Payer: Self-pay | Admitting: Internal Medicine

## 2015-02-13 ENCOUNTER — Other Ambulatory Visit: Payer: Self-pay | Admitting: Hematology and Oncology

## 2015-02-13 ENCOUNTER — Other Ambulatory Visit: Payer: Self-pay | Admitting: *Deleted

## 2015-02-13 ENCOUNTER — Telehealth: Payer: Self-pay | Admitting: *Deleted

## 2015-02-13 ENCOUNTER — Telehealth: Payer: Self-pay

## 2015-02-13 MED ORDER — HYDROXYUREA 500 MG PO CAPS: 1500.0000 mg | ORAL_CAPSULE | Freq: Every day | ORAL | Status: DC

## 2015-02-13 MED ORDER — HYDROXYUREA 500 MG PO CAPS
1500.0000 mg | ORAL_CAPSULE | Freq: Every day | ORAL | Status: DC
Start: 2015-02-13 — End: 2015-02-13

## 2015-02-13 NOTE — Telephone Encounter (Signed)
Patient stated that she had a heart attack, she has question about what she need to do, please advise

## 2015-02-13 NOTE — Telephone Encounter (Signed)
Dr.Kelly advised ok for home physical therapy to help with weakness in legs.Note faxed to [facility] at fax #  [phone].

## 2015-02-13 NOTE — Telephone Encounter (Signed)
Spoke to [place] was advised to fax note to [phone] to order patient home physical therapy.[facility] to fax a order for Dr.Kelly to sign.

## 2015-02-13 NOTE — Telephone Encounter (Signed)
Spoke with patient regarding appt with Dr Alvy Bimler. States she is not allowed to leave house after 5/11- had MI.

## 2015-02-13 NOTE — Telephone Encounter (Signed)
Returned pt's call, she said she needs a follow up appt. Advised pt that she already has an appt scheduled for May 18th. Pt voiced understanding.

## 2015-02-14 ENCOUNTER — Telehealth: Payer: Self-pay | Admitting: Cardiovascular Disease

## 2015-02-14 ENCOUNTER — Other Ambulatory Visit: Payer: Self-pay | Admitting: Hematology and Oncology

## 2015-02-14 NOTE — Telephone Encounter (Signed)
Melissa was calling in to speak with Mariann Laster about a referral that was sent over for Home health orders for physical therapy. Please follow up with her .  Thanks

## 2015-02-14 NOTE — Telephone Encounter (Signed)
Spoke with [facility], they are unable to do the patient's rehab at this time. They are currently not taking their insurance. Will need to forward the PT order to another home health agency. Will send this information to [facility] phone = [phone] Information faxed to [phone]

## 2015-02-17 DIAGNOSIS — M6281 Muscle weakness (generalized): Secondary | ICD-10-CM | POA: Diagnosis not present

## 2015-02-17 DIAGNOSIS — E119 Type 2 diabetes mellitus without complications: Secondary | ICD-10-CM | POA: Diagnosis not present

## 2015-02-17 DIAGNOSIS — R269 Unspecified abnormalities of gait and mobility: Secondary | ICD-10-CM | POA: Diagnosis not present

## 2015-02-17 DIAGNOSIS — I10 Essential (primary) hypertension: Secondary | ICD-10-CM | POA: Diagnosis not present

## 2015-02-17 DIAGNOSIS — I213 ST elevation (STEMI) myocardial infarction of unspecified site: Secondary | ICD-10-CM | POA: Diagnosis not present

## 2015-02-18 ENCOUNTER — Telehealth: Payer: Self-pay | Admitting: Cardiovascular Disease

## 2015-02-18 DIAGNOSIS — I10 Essential (primary) hypertension: Secondary | ICD-10-CM | POA: Diagnosis not present

## 2015-02-18 DIAGNOSIS — R269 Unspecified abnormalities of gait and mobility: Secondary | ICD-10-CM | POA: Diagnosis not present

## 2015-02-18 DIAGNOSIS — I213 ST elevation (STEMI) myocardial infarction of unspecified site: Secondary | ICD-10-CM | POA: Diagnosis not present

## 2015-02-18 DIAGNOSIS — M6281 Muscle weakness (generalized): Secondary | ICD-10-CM | POA: Diagnosis not present

## 2015-02-18 DIAGNOSIS — E119 Type 2 diabetes mellitus without complications: Secondary | ICD-10-CM | POA: Diagnosis not present

## 2015-02-18 NOTE — Telephone Encounter (Signed)
Amanda [name] from [facility] called and wanted Dr. [name] to know that this pt. Will be having rehab 3 times a week for two weeks then two times a week for three weeks , Nurse also requested an order for a rollator walker to assist with ambulation and an order for a nurse evaluation , this was all sent in a staff message to Dr. [name]  orders to be faxed to [place] [phone]

## 2015-02-20 ENCOUNTER — Telehealth: Payer: Self-pay | Admitting: Cardiovascular Disease

## 2015-02-20 DIAGNOSIS — R269 Unspecified abnormalities of gait and mobility: Secondary | ICD-10-CM | POA: Diagnosis not present

## 2015-02-20 DIAGNOSIS — I213 ST elevation (STEMI) myocardial infarction of unspecified site: Secondary | ICD-10-CM | POA: Diagnosis not present

## 2015-02-20 DIAGNOSIS — M6281 Muscle weakness (generalized): Secondary | ICD-10-CM | POA: Diagnosis not present

## 2015-02-20 DIAGNOSIS — E119 Type 2 diabetes mellitus without complications: Secondary | ICD-10-CM | POA: Diagnosis not present

## 2015-02-20 DIAGNOSIS — I10 Essential (primary) hypertension: Secondary | ICD-10-CM | POA: Diagnosis not present

## 2015-02-20 NOTE — Telephone Encounter (Signed)
Verdis Frederickson, [place] therapist from [facility] following up on an order request for home health RN eval. Also rx needed for patient's walker.  Informed I would send to Dr. [name].  She also had questions r/t patient's diabetes - appears this is managed by Dr. Cruzita Lederer so her contact number was given to caller.

## 2015-02-24 DIAGNOSIS — E119 Type 2 diabetes mellitus without complications: Secondary | ICD-10-CM | POA: Diagnosis not present

## 2015-02-24 DIAGNOSIS — M6281 Muscle weakness (generalized): Secondary | ICD-10-CM | POA: Diagnosis not present

## 2015-02-24 DIAGNOSIS — R269 Unspecified abnormalities of gait and mobility: Secondary | ICD-10-CM | POA: Diagnosis not present

## 2015-02-24 DIAGNOSIS — I213 ST elevation (STEMI) myocardial infarction of unspecified site: Secondary | ICD-10-CM | POA: Diagnosis not present

## 2015-02-24 DIAGNOSIS — I10 Essential (primary) hypertension: Secondary | ICD-10-CM | POA: Diagnosis not present

## 2015-02-25 DIAGNOSIS — M6281 Muscle weakness (generalized): Secondary | ICD-10-CM | POA: Diagnosis not present

## 2015-02-25 DIAGNOSIS — I213 ST elevation (STEMI) myocardial infarction of unspecified site: Secondary | ICD-10-CM | POA: Diagnosis not present

## 2015-02-25 DIAGNOSIS — I10 Essential (primary) hypertension: Secondary | ICD-10-CM | POA: Diagnosis not present

## 2015-02-25 DIAGNOSIS — E119 Type 2 diabetes mellitus without complications: Secondary | ICD-10-CM | POA: Diagnosis not present

## 2015-02-25 DIAGNOSIS — R269 Unspecified abnormalities of gait and mobility: Secondary | ICD-10-CM | POA: Diagnosis not present

## 2015-02-25 NOTE — Progress Notes (Signed)
02/26/2015 Amanda [name]   1940/11/03  [id number]  Primary Physician Garnet Koyanagi, DO Primary Cardiologist: Dr. [name]  Reason for Visit/CC: [place] s/p STEMI   HPI:  The patient is a 74 y/o female who presents to clinic for post hospital f/u after recent admission to [facility] for STEMI. Emergent left heart catheterization revealed occlusion of the circumflex artery. She underwent successful PCI of the left circumflex vessel with the 90-95% stenosis being reduced to 0% after insertion of a Xience Alpine 3.023 mm DES stent postdilated 3.15 mm, and the distal circumflex 80% stenosis been reduced to 0% treated with 2.518 mm Xience Alpine DES stent dilated to 2.57 mm.Echocardiogram revealed normal left ventricular systolic function with an ejection fraction of 50-55% with mild posterior lateral wall hypokinesis and diastolic dysfunction. She was placed on dual antiplatelet therapy with aspirin + Brilinta. She was also placed on beta blocker, ACE inhibitor and statin therapy. She was initially started on 100 mg Metroprolol however this was decreased on 75 mg due to persistent bradycardia in the 40s. During her hospitalization, she also had acute on chronic diastolic heart failure exacerbation leading to increased work of breathing and tachypnea. She required supplemental oxygen and treatment with IV diuretics. She was later transitioned to 40 mg of by mouth Lasix daily. He was discharged home in stable condition on 02/03/2005.  She presents back to clinic today for post hospital follow-up. Since discharge from hospital, she notes that she has done fairly well. She denies any recurrent CP. No dyspnea, othopnea, LEE, dizziness, syncope/ near syncope. She reports full medication compliance. Tolerating meds well. Her main complaint is LE weakness/ deconditioning. She is currently undergoing home PT. She is in need of a rolling walker.  Her EKG shows NSR. BP is controlled.    Current  Outpatient Prescriptions  Medication Sig Dispense Refill  . acetaminophen-codeine (TYLENOL #3) 300-30 MG per tablet Take one or two tablets every 6 hours prn pain 30 tablet 0  . amLODipine (NORVASC) 10 MG tablet Take 1 tablet (10 mg total) by mouth daily. 30 tablet 11  . aspirin EC 81 MG tablet Take 81 mg by mouth daily.    Marland Kitchen atorvastatin (LIPITOR) 80 MG tablet Take 1 tablet (80 mg total) by mouth daily at 6 PM. 30 tablet 11  . B-D INS SYR ULTRAFINE 1CC/31G 31G X 5/16" 1 ML MISC     . diphenhydrAMINE (BENADRYL) 25 mg capsule Take 25 mg by mouth every 6 (six) hours as needed.    . furosemide (LASIX) 40 MG tablet Take 1 tablet (40 mg total) by mouth daily. 30 tablet 11  . gabapentin (NEURONTIN) 100 MG capsule Take up to 6 tablets 3x a day as needed for pain 90 capsule 2  . glucose blood (BAYER CONTOUR NEXT TEST) test strip Use to test blood sugar 3 times daily as instructed. Dx code: 250.62 100 each 11  . hydrocortisone 2.5 % cream Apply 1 application topically 2 (two) times daily.    . hydroxyurea (HYDREA) 500 MG capsule Take 3 capsules (1,500 mg total) by mouth daily. May take with food to minimize GI side effects. 60 capsule 0  . hydroxyurea (HYDREA) 500 MG capsule TAKE 3 CAPSULES (1,500 MG TOTAL) BY MOUTH DAILY. MAY TAKE WITH FOOD TO MINIMIZE GI SIDE EFFECTS. 90 capsule 0  . insulin regular human CONCENTRATED (HUMULIN R) 500 UNIT/ML SOLN injection Inject under skin 66 units before breakfast and dinner 20 mL 2  .  ketoconazole (NIZORAL) 2 % cream Apply topically daily. Apply to rash daily for 10 days 15 g 1  . lisinopril (PRINIVIL,ZESTRIL) 40 MG tablet TAKE 1 TABLET (40 MG TOTAL) BY MOUTH DAILY. 90 tablet 1  . loperamide (IMODIUM A-D) 2 MG tablet Take 2 mg by mouth daily.    . metoprolol (LOPRESSOR) 100 MG tablet Take 1 tablet by mouth daily.    . Metoprolol Tartrate 75 MG TABS Take 75 mg by mouth 2 (two) times daily. 60 tablet 11  . nitroGLYCERIN (NITROSTAT) 0.4 MG SL tablet Place 1 tablet (0.4  mg total) under the tongue every 5 (five) minutes as needed for chest pain. 25 tablet 3  . NONFORMULARY OR COMPOUNDED ITEM Neuropathy cream: mix Amitriptyline 2%, Lidocaine 2%, and Ketamine 1% - apply on feet 2x a day [facility]. 1 each 2  . nystatin cream (MYCOSTATIN) Apply 1 application topically 2 (two) times daily.    Marland Kitchen omega-3 acid ethyl esters (LOVAZA) 1 G capsule Take 1 capsule (1 g total) by mouth 2 (two) times daily. 60 capsule 11  . ondansetron (ZOFRAN) 8 MG tablet Take 8 mg by mouth every 8 (eight) hours as needed for nausea or vomiting.    Marland Kitchen oxyCODONE-acetaminophen (PERCOCET/ROXICET) 5-325 MG per tablet Take 1 tablet by mouth every 4 (four) hours as needed for moderate pain. 20 tablet 0  . pravastatin (PRAVACHOL) 40 MG tablet Take 1 tablet by mouth daily.    . Probiotic Product (PROBIOTIC DAILY PO) Take by mouth daily.    . ticagrelor (BRILINTA) 90 MG TABS tablet Take 1 tablet (90 mg total) by mouth 2 (two) times daily. 60 tablet 5   No current facility-administered medications for this visit.    Allergies  Allergen Reactions  . Tape Hives  . Doxycycline Hives, Swelling and Rash  . Latex Hives, Itching and Rash    History   Social History  . Marital Status: Married    Spouse Name: N/A  . Number of Children: 2  . Years of Education: N/A   Occupational History  . housewife    Social History Main Topics  . Smoking status: Former Smoker -- 48 years    Types: Cigarettes    Quit date: 10/18/1986  . Smokeless tobacco: Never Used  . Alcohol Use: No  . Drug Use: No  . Sexual Activity: Yes    Birth Control/ Protection: None   Other Topics Concern  . Not on file   Social History Narrative     Review of Systems: General: negative for chills, fever, night sweats or weight changes.  Cardiovascular: negative for chest pain, dyspnea on exertion, edema, orthopnea, palpitations, paroxysmal nocturnal dyspnea or shortness of breath Dermatological: negative for  rash Respiratory: negative for cough or wheezing Urologic: negative for hematuria Abdominal: negative for nausea, vomiting, diarrhea, bright red blood per rectum, melena, or hematemesis Neurologic: negative for visual changes, syncope, or dizziness All other systems reviewed and are otherwise negative except as noted above.    Blood pressure 142/64, height 5' 4.5" (1.638 m), weight 214 lb 14.4 oz (97.478 kg).  General appearance: alert, cooperative and no distress Neck: no carotid bruit and no JVD Lungs: clear to auscultation bilaterally Heart: regular rate and rhythm, S1, S2 normal, no murmur, click, rub or gallop Extremities: no LEE Pulses: 2+ and symmetric Skin: warm and dry Neurologic: Grossly normal  EKG NSR. No ishcemia  ASSESSMENT AND PLAN:   1. CAD: status post recent STEMI involving occlusion of the  left circumflex coronary artery, successfully treated with PCI plus drug-eluting stenting. No recurrent CP or dyspnea. Continue dual antiplatelet therapy with aspirin plus Brilinta for a minimum of one year given newly placed drug-eluting stent. Continue beta blocker, ACE inhibitor and statin therapy.  2. Post left heart catheterization:  she denies any postprocedure complications. Cath access site is stable.  3. Hypertension: fairly controlled on current regimen. Systolic BP in the 086V. Pt was not taking amlodipine as directed on discharge summary. Patient instructed to take daily.  4. Chronic Diastolic Dysfunction: stable. No recurrent dyspnea or LEE. Continue daily diuretic + BB along with daily weights and low sodium diet. Patient requesting to change back to HCTZ as Lasix has been very inconvenient given frequent interruptions for runs to the bathroom. If volume is not well controlled on HCTZ, will need to switch back to Lasix.   5. HLD: LDL was 88. Continue high dose Lipitor. Goal is <70 mg/dL.    PLAN  follow-up with Dr. [name] in 6 weeks.   SIMMONS,  BRITTAINYPA-C 02/26/2015 8:51 AM

## 2015-02-26 ENCOUNTER — Encounter: Payer: Self-pay | Admitting: Cardiology

## 2015-02-26 ENCOUNTER — Ambulatory Visit (INDEPENDENT_AMBULATORY_CARE_PROVIDER_SITE_OTHER): Payer: Medicare Other | Admitting: Cardiology

## 2015-02-26 VITALS — BP 142/64 | Ht 64.5 in | Wt 214.9 lb

## 2015-02-26 DIAGNOSIS — R29898 Other symptoms and signs involving the musculoskeletal system: Secondary | ICD-10-CM | POA: Diagnosis not present

## 2015-02-26 DIAGNOSIS — I10 Essential (primary) hypertension: Secondary | ICD-10-CM

## 2015-02-26 DIAGNOSIS — R531 Weakness: Secondary | ICD-10-CM | POA: Diagnosis not present

## 2015-02-26 MED ORDER — HYDROCHLOROTHIAZIDE 12.5 MG PO CAPS: 12.5000 mg | ORAL_CAPSULE | Freq: Every day | ORAL | Status: DC

## 2015-02-26 NOTE — Patient Instructions (Addendum)
We have written you a referral for a 4 wheel walker with a seat.  Your physician recommends that you schedule a follow-up appointment in:6 weeks with Dr.Kelly  STOP LASIX  START HCTZ 12.23m Daily

## 2015-02-27 ENCOUNTER — Telehealth: Payer: Self-pay | Admitting: Hematology and Oncology

## 2015-02-27 ENCOUNTER — Other Ambulatory Visit (HOSPITAL_BASED_OUTPATIENT_CLINIC_OR_DEPARTMENT_OTHER): Payer: Medicare Other

## 2015-02-27 ENCOUNTER — Ambulatory Visit (HOSPITAL_BASED_OUTPATIENT_CLINIC_OR_DEPARTMENT_OTHER): Payer: Medicare Other | Admitting: Hematology and Oncology

## 2015-02-27 ENCOUNTER — Encounter: Payer: Self-pay | Admitting: Hematology and Oncology

## 2015-02-27 VITALS — BP 158/58 | HR 72 | Temp 98.1°F | Resp 19 | Ht 64.5 in | Wt 218.2 lb

## 2015-02-27 DIAGNOSIS — D45 Polycythemia vera: Secondary | ICD-10-CM

## 2015-02-27 DIAGNOSIS — R21 Rash and other nonspecific skin eruption: Secondary | ICD-10-CM | POA: Diagnosis not present

## 2015-02-27 DIAGNOSIS — I2510 Atherosclerotic heart disease of native coronary artery without angina pectoris: Secondary | ICD-10-CM | POA: Diagnosis not present

## 2015-02-27 LAB — FERRITIN CHCC: FERRITIN: 102 ng/mL (ref 9–269)

## 2015-02-27 LAB — CBC WITH DIFFERENTIAL/PLATELET
BASO%: 0.3 % (ref 0.0–2.0)
BASOS ABS: 0 10*3/uL (ref 0.0–0.1)
EOS%: 5.7 % (ref 0.0–7.0)
Eosinophils Absolute: 0.6 10*3/uL — ABNORMAL HIGH (ref 0.0–0.5)
HCT: 38.5 % (ref 34.8–46.6)
HGB: 13.1 g/dL (ref 11.6–15.9)
LYMPH%: 15.7 % (ref 14.0–49.7)
MCH: 34.8 pg — AB (ref 25.1–34.0)
MCHC: 34 g/dL (ref 31.5–36.0)
MCV: 102.4 fL — ABNORMAL HIGH (ref 79.5–101.0)
MONO#: 0.9 10*3/uL (ref 0.1–0.9)
MONO%: 8.6 % (ref 0.0–14.0)
NEUT#: 7.3 10*3/uL — ABNORMAL HIGH (ref 1.5–6.5)
NEUT%: 69.7 % (ref 38.4–76.8)
Platelets: 470 10*3/uL — ABNORMAL HIGH (ref 145–400)
RBC: 3.76 10*6/uL (ref 3.70–5.45)
RDW: 16.5 % — AB (ref 11.2–14.5)
WBC: 10.5 10*3/uL — ABNORMAL HIGH (ref 3.9–10.3)
lymph#: 1.6 10*3/uL (ref 0.9–3.3)

## 2015-02-27 MED ORDER — HYDROXYUREA 500 MG PO CAPS: 500.0000 mg | ORAL_CAPSULE | Freq: Every day | ORAL | Status: DC

## 2015-02-27 NOTE — Telephone Encounter (Signed)
Pt confirmed labs/ov per 05/12 POF, gave pt AVS and Calendar.... KJ

## 2015-02-28 DIAGNOSIS — I10 Essential (primary) hypertension: Secondary | ICD-10-CM | POA: Diagnosis not present

## 2015-02-28 DIAGNOSIS — M6281 Muscle weakness (generalized): Secondary | ICD-10-CM | POA: Diagnosis not present

## 2015-02-28 DIAGNOSIS — R269 Unspecified abnormalities of gait and mobility: Secondary | ICD-10-CM | POA: Diagnosis not present

## 2015-02-28 DIAGNOSIS — I213 ST elevation (STEMI) myocardial infarction of unspecified site: Secondary | ICD-10-CM | POA: Diagnosis not present

## 2015-02-28 DIAGNOSIS — E119 Type 2 diabetes mellitus without complications: Secondary | ICD-10-CM | POA: Diagnosis not present

## 2015-03-01 DIAGNOSIS — I2510 Atherosclerotic heart disease of native coronary artery without angina pectoris: Secondary | ICD-10-CM | POA: Insufficient documentation

## 2015-03-01 DIAGNOSIS — R21 Rash and other nonspecific skin eruption: Secondary | ICD-10-CM | POA: Insufficient documentation

## 2015-03-01 NOTE — Progress Notes (Signed)
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 The patient had bone marrow aspirate and biopsy performed in [place] and confirmed myeloproliferative disorder. We do not have records of those results. She had been treated with phlebotomy, hydroxyurea and aspirin.  The patient also have liver cirrhosis with splenomegaly, on observation On 07/04/2014, hydroxyurea was increa sed to 2 tablets daily except on Saturdays and Sundays she take 3 tablets On 09/30/2014, I increase hydroxyurea to 3 tablets daily In April 2016, she had heart attack with stent placement. She was taken off hydroxyurea INTERVAL HISTORY: Please see below for problem oriented charting. She feels well Denies Chest pain or SOB. No recent infection The patient denies any recent signs or symptoms of bleeding such as spontaneous epistaxis, hematuria or hematochezia. She complained of mild skin rash after eating fish today REVIEW OF SYSTEMS:   Constitutional: Denies fevers, chills or abnormal weight loss Eyes: Denies blurriness of vision Ears, nose, mouth, throat, and face: Denies mucositis or sore throat Respiratory: Denies cough, dyspnea or wheezes Cardiovascular: Denies palpitation, chest discomfort or lower extremity swelling Gastrointestinal:  Denies nausea, heartburn or change in bowel habits Lymphatics: Denies new lymphadenopathy or easy bruising Neurological:Denies numbness, tingling or new weaknesses Behavioral/Psych: Mood is stable, no new changes  All other systems were reviewed with the  patient and are negative.  I have reviewed the past medical history, past surgical history, social history and family history with the patient and they are unchanged from previous note.  ALLERGIES:  is allergic to tape; doxycycline; and latex.  MEDICATIONS:  Current Outpatient Prescriptions  Medication Sig Dispense Refill  . acetaminophen-codeine (TYLENOL #3) 300-30 MG per tablet Take one or two tablets every 6 hours prn pain 30 tablet 0  . amLODipine (NORVASC) 10 MG tablet Take 1 tablet (10 mg total) by mouth daily. 30 tablet 11  . aspirin EC 81 MG tablet Take 81 mg by mouth daily.    Marland Kitchen atorvastatin (LIPITOR) 80 MG tablet Take 1 tablet (80 mg total) by mouth daily at 6 PM. 30 tablet 11  . B-D INS SYR ULTRAFINE 1CC/31G 31G X 5/16" 1 ML MISC     . diphenhydrAMINE (BENADRYL) 25 mg capsule Take 25 mg by mouth every 6 (six) hours as needed.    . gabapentin (NEURONTIN) 100 MG capsule Take up to 6 tablets 3x a day as needed for pain 90 capsule 2  . glucose blood (BAYER CONTOUR NEXT TEST) test strip Use to test blood sugar 3 times daily as instructed. Dx code: 250.62 100 each 11  . hydrochlorothiazide (MICROZIDE) 12.5 MG capsule Take 1 capsule (12.5 mg total) by mouth daily. 90 capsule 3  . hydrocortisone 2.5 % cream Apply 1 application topically 2 (two) times daily.    . hydroxyurea (HYDREA) 500 MG capsule Take 1 capsule (500 mg total) by mouth daily. May take with food to minimize GI side effects. 90 capsule 0  . insulin regular human CONCENTRATED (HUMULIN R) 500 UNIT/ML SOLN injection Inject under skin 66 units before breakfast and dinner 20 mL 2  . ketoconazole (NIZORAL) 2 % cream Apply topically daily.  Apply to rash daily for 10 days 15 g 1  . lisinopril (PRINIVIL,ZESTRIL) 40 MG tablet TAKE 1 TABLET (40 MG TOTAL) BY MOUTH DAILY. 90 tablet 1  . loperamide (IMODIUM A-D) 2 MG tablet Take 2 mg by mouth daily.    . metoprolol (LOPRESSOR) 100 MG tablet Take 1 tablet by mouth daily.    . Metoprolol  Tartrate 75 MG TABS Take 75 mg by mouth 2 (two) times daily. 60 tablet 11  . nitroGLYCERIN (NITROSTAT) 0.4 MG SL tablet Place 1 tablet (0.4 mg total) under the tongue every 5 (five) minutes as needed for chest pain. 25 tablet 3  . NONFORMULARY OR COMPOUNDED ITEM Neuropathy cream: mix Amitriptyline 2%, Lidocaine 2%, and Ketamine 1% - apply on feet 2x a day [facility]. 1 each 2  . nystatin cream (MYCOSTATIN) Apply 1 application topically 2 (two) times daily.    Marland Kitchen omega-3 acid ethyl esters (LOVAZA) 1 G capsule Take 1 capsule (1 g total) by mouth 2 (two) times daily. 60 capsule 11  . ondansetron (ZOFRAN) 8 MG tablet Take 8 mg by mouth every 8 (eight) hours as needed for nausea or vomiting.    Marland Kitchen oxyCODONE-acetaminophen (PERCOCET/ROXICET) 5-325 MG per tablet Take 1 tablet by mouth every 4 (four) hours as needed for moderate pain. 20 tablet 0  . pravastatin (PRAVACHOL) 40 MG tablet Take 1 tablet by mouth daily.    . Probiotic Product (PROBIOTIC DAILY PO) Take by mouth daily.    . ticagrelor (BRILINTA) 90 MG TABS tablet Take 1 tablet (90 mg total) by mouth 2 (two) times daily. 60 tablet 5  . hydroxyurea (HYDREA) 500 MG capsule Take 3 capsules (1,500 mg total) by mouth daily. May take with food to minimize GI side effects. (Patient not taking: Reported on 02/27/2015) 60 capsule 0   No current facility-administered medications for this visit.    PHYSICAL EXAMINATION: ECOG PERFORMANCE STATUS: 1 - Symptomatic but completely ambulatory  Filed Vitals:   02/27/15 1407  BP: 158/58  Pulse: 72  Temp: 98.1 F (36.7 C)  Resp: 19   Filed Weights   02/27/15 1407  Weight: 218 lb 3.2 oz (98.975 kg)    GENERAL:alert, no distress and comfortable SKIN: mild skin rash on her forearms resembling dermatitis EYES: normal, Conjunctiva are pink and non-injected, sclera clear OROPHARYNX:no exudate, no erythema and lips, buccal mucosa, and tongue normal  NECK: supple, thyroid normal size, non-tender,  without nodularity LYMPH:  no palpable lymphadenopathy in the cervical, axillary or inguinal LUNGS: clear to auscultation and percussion with normal breathing effort HEART: regular rate & rhythm and no murmurs and no lower extremity edema ABDOMEN:abdomen soft, non-tender and normal bowel sounds Musculoskeletal:no cyanosis of digits and no clubbing  NEURO: alert & oriented x 3 with fluent speech, no focal motor/sensory deficits  LABORATORY DATA:  I have reviewed the data as listed    Component Value Date/Time   NA 144 02/04/2015 0411   NA 133* 12/10/2013 1239   K 3.8 02/04/2015 0411   K 4.0 12/10/2013 1239   CL 104 02/04/2015 0411   CL 103 02/16/2013 0900   CO2 30 02/04/2015 0411   CO2 21* 12/10/2013 1239   GLUCOSE 109* 02/04/2015 0411   GLUCOSE 635* 12/10/2013 1239   GLUCOSE 396* 02/16/2013 0900   GLUCOSE 292 06/02/2010   BUN 37* 02/04/2015 0411   BUN 7.9 12/10/2013 1239   CREATININE 1.07 02/04/2015 0411   CREATININE 1.2* 12/10/2013 1239   CALCIUM 8.9 02/04/2015 0411  CALCIUM 9.5 12/10/2013 1239   PROT 6.2 01/27/2015 1320   PROT 6.7 12/10/2013 1239   ALBUMIN 3.5 01/27/2015 1320   ALBUMIN 3.7 12/10/2013 1239   AST 68* 01/27/2015 1320   AST 27 12/10/2013 1239   ALT 18 01/27/2015 1320   ALT 18 12/10/2013 1239   ALKPHOS 80 01/27/2015 1320   ALKPHOS 108 12/10/2013 1239   BILITOT 1.2 01/27/2015 1320   BILITOT 0.83 12/10/2013 1239   GFRNONAA 49* 02/04/2015 0411   GFRAA 57* 02/04/2015 0411    No results found for: SPEP, UPEP  Lab Results  Component Value Date   WBC 10.5* 02/27/2015   NEUTROABS 7.3* 02/27/2015   HGB 13.1 02/27/2015   HCT 38.5 02/27/2015   MCV 102.4* 02/27/2015   PLT 470* 02/27/2015      Chemistry      Component Value Date/Time   NA 144 02/04/2015 0411   NA 133* 12/10/2013 1239   K 3.8 02/04/2015 0411   K 4.0 12/10/2013 1239   CL 104 02/04/2015 0411   CL 103 02/16/2013 0900   CO2 30 02/04/2015 0411   CO2 21* 12/10/2013 1239   BUN 37*  02/04/2015 0411   BUN 7.9 12/10/2013 1239   CREATININE 1.07 02/04/2015 0411   CREATININE 1.2* 12/10/2013 1239      Component Value Date/Time   CALCIUM 8.9 02/04/2015 0411   CALCIUM 9.5 12/10/2013 1239   ALKPHOS 80 01/27/2015 1320   ALKPHOS 108 12/10/2013 1239   AST 68* 01/27/2015 1320   AST 27 12/10/2013 1239   ALT 18 01/27/2015 1320   ALT 18 12/10/2013 1239   BILITOT 1.2 01/27/2015 1320   BILITOT 0.83 12/10/2013 1239    ASSESSMENT & PLAN:  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 I am surprised to her that her CBC is not too abnormal I recommend restarting hydroxyurea at 500 mg daily and recheck next month   Skin rash This looks like dermatitis I recommend OTC benadryl   Coronary artery disease involving native coronary artery She has no signs of CHF. Continue medical management    Orders Placed This Encounter  Procedures  . CBC with Differential/Platelet    Standing Status: Standing     Number of Occurrences: 9     Standing Expiration Date: 02/27/2016   All questions were answered. The patient knows to call the clinic with any problems, questions or concerns. No barriers to learning was detected. I spent 15 minutes counseling the patient face to face. The total time spent in the appointment was 20 minutes and more than 50% was on counseling and review of test results     [facility], [place], MD 03/01/2015 10:24 AM

## 2015-03-01 NOTE — Assessment & Plan Note (Signed)
This looks like dermatitis I recommend OTC benadryl

## 2015-03-01 NOTE — Assessment & Plan Note (Signed)
She has missed many days of hydroxyurea because of recent heart attack. I am surprised to her that her CBC is not too abnormal I recommend restarting hydroxyurea at 500 mg daily and recheck next month

## 2015-03-01 NOTE — Assessment & Plan Note (Signed)
She has no signs of CHF. Continue medical management

## 2015-03-02 NOTE — Telephone Encounter (Signed)
[place] for prescription

## 2015-03-03 ENCOUNTER — Telehealth: Payer: Self-pay | Admitting: *Deleted

## 2015-03-03 ENCOUNTER — Encounter: Payer: Self-pay | Admitting: *Deleted

## 2015-03-03 DIAGNOSIS — R269 Unspecified abnormalities of gait and mobility: Secondary | ICD-10-CM | POA: Diagnosis not present

## 2015-03-03 DIAGNOSIS — E119 Type 2 diabetes mellitus without complications: Secondary | ICD-10-CM | POA: Diagnosis not present

## 2015-03-03 DIAGNOSIS — M6281 Muscle weakness (generalized): Secondary | ICD-10-CM | POA: Diagnosis not present

## 2015-03-03 DIAGNOSIS — I10 Essential (primary) hypertension: Secondary | ICD-10-CM | POA: Diagnosis not present

## 2015-03-03 DIAGNOSIS — I213 ST elevation (STEMI) myocardial infarction of unspecified site: Secondary | ICD-10-CM | POA: Diagnosis not present

## 2015-03-03 NOTE — Telephone Encounter (Signed)
Left message instructing Verdis Frederickson to callback if rx still needed.

## 2015-03-03 NOTE — Telephone Encounter (Signed)
Pre-Visit Call completed with patient and chart updated.   Pre-Visit Info documented in Specialty Comments under [facility].    

## 2015-03-03 NOTE — Telephone Encounter (Signed)
Rx done by [place] last week. Advised to f/u w/ PCP on [facility] order.

## 2015-03-04 ENCOUNTER — Ambulatory Visit (INDEPENDENT_AMBULATORY_CARE_PROVIDER_SITE_OTHER): Payer: Medicare Other | Admitting: Family Medicine

## 2015-03-04 ENCOUNTER — Encounter: Payer: Self-pay | Admitting: Family Medicine

## 2015-03-04 VITALS — BP 118/60 | HR 87 | Temp 98.3°F | Ht 64.0 in | Wt 216.4 lb

## 2015-03-04 DIAGNOSIS — R058 Other specified cough: Secondary | ICD-10-CM

## 2015-03-04 DIAGNOSIS — I10 Essential (primary) hypertension: Secondary | ICD-10-CM

## 2015-03-04 DIAGNOSIS — I213 ST elevation (STEMI) myocardial infarction of unspecified site: Secondary | ICD-10-CM

## 2015-03-04 DIAGNOSIS — I219 Acute myocardial infarction, unspecified: Secondary | ICD-10-CM

## 2015-03-04 DIAGNOSIS — I2109 ST elevation (STEMI) myocardial infarction involving other coronary artery of anterior wall: Secondary | ICD-10-CM

## 2015-03-04 DIAGNOSIS — I2119 ST elevation (STEMI) myocardial infarction involving other coronary artery of inferior wall: Secondary | ICD-10-CM

## 2015-03-04 DIAGNOSIS — R05 Cough: Secondary | ICD-10-CM | POA: Insufficient documentation

## 2015-03-04 DIAGNOSIS — E2839 Other primary ovarian failure: Secondary | ICD-10-CM | POA: Diagnosis not present

## 2015-03-04 DIAGNOSIS — J302 Other seasonal allergic rhinitis: Secondary | ICD-10-CM

## 2015-03-04 MED ORDER — LOSARTAN POTASSIUM 100 MG PO TABS: 100.0000 mg | ORAL_TABLET | Freq: Every day | ORAL | Status: DC

## 2015-03-04 MED ORDER — FLUTICASONE PROPIONATE 50 MCG/ACT NA SUSP: 2.0000 | Freq: Every day | NASAL | Status: DC

## 2015-03-04 NOTE — Progress Notes (Signed)
Pre visit review using our clinic review tool, if applicable. No additional management support is needed unless otherwise documented below in the visit note. 

## 2015-03-04 NOTE — Patient Instructions (Signed)
Please call the [facility] to schedule your Bone Density Scan Address: [address] [place], [place] [address]  [phone]  Hours:   Tuesday 7AM-5PM  Wednesday 7AM-5PM  Thursday 7AM-5PM  Friday 7AM-5PM  Saturday Closed  Sunday Closed  Monday 7AM-6:30PM

## 2015-03-04 NOTE — Assessment & Plan Note (Signed)
Per cardiology [facility] referral in for nsg and pt

## 2015-03-04 NOTE — Assessment & Plan Note (Signed)
D/c lisinopril Start cozaar rto 2-3 weeks

## 2015-03-04 NOTE — Progress Notes (Signed)
Patient ID: Amanda [name], female    DOB: 01-03-1941  Age: 74 y.o. MRN: [id number]    Subjective:  Subjective HPI Amanda [name] presents to establish.  She is here with her husband.  She was recently in hospital with MI.  She c/o dry cough and runny nose since.  Hospital records reviewed.    Review of Systems  Constitutional: Negative for diaphoresis, appetite change, fatigue and unexpected weight change.  Eyes: Negative for pain, redness and visual disturbance.  Respiratory: Negative for cough, chest tightness, shortness of breath and wheezing.   Cardiovascular: Negative for chest pain, palpitations and leg swelling.  Endocrine: Negative for cold intolerance, heat intolerance, polydipsia, polyphagia and polyuria.  Genitourinary: Negative for dysuria, frequency and difficulty urinating.  Neurological: Negative for dizziness, light-headedness, numbness and headaches.    History Past Medical History  Diagnosis Date  . Hypertension   . Hyperlipidemia   . Diabetes mellitus 2008  . Depression   . Obesity   . Psoriasis   . Polycythemia     Dr. Elease Hashimoto- [facility]  . Herpes simplex   . Gout   . Hyperglycemia 05/31/2013  . C. difficile colitis   . Cirrhosis   . Cancer     SKIN  . Neuromuscular disorder     BELL PALSY  . Candidiasis of skin 09/30/2014  . Myocardial infarction     She has past surgical history that includes Breast surgery (Left); Total abdominal hysterectomy w/ bilateral salpingoophorectomy; Tonsillectomy; Colonoscopy; and left heart catheterization with coronary angiogram (N/A, 01/27/2015).   Her family history includes Breast cancer in her maternal aunt; Diabetes in her mother; Heart disease in her maternal grandmother, mother, and other; Hyperlipidemia in her mother; Hypertension in her mother; Kidney disease in her father and mother.She reports that she quit smoking about 28 years ago. Her smoking use included Cigarettes. She quit after 48 years of use. She  has never used smokeless tobacco. She reports that she does not drink alcohol or use illicit drugs.  Current Outpatient Prescriptions on File Prior to Visit  Medication Sig Dispense Refill  . acetaminophen-codeine (TYLENOL #3) 300-30 MG per tablet Take one or two tablets every 6 hours prn pain 30 tablet 0  . amLODipine (NORVASC) 10 MG tablet Take 1 tablet (10 mg total) by mouth daily. 30 tablet 11  . aspirin EC 81 MG tablet Take 81 mg by mouth daily.    Marland Kitchen atorvastatin (LIPITOR) 80 MG tablet Take 1 tablet (80 mg total) by mouth daily at 6 PM. 30 tablet 11  . B-D INS SYR ULTRAFINE 1CC/31G 31G X 5/16" 1 ML MISC     . diphenhydrAMINE (BENADRYL) 25 mg capsule Take 25 mg by mouth every 6 (six) hours as needed.    . gabapentin (NEURONTIN) 100 MG capsule Take up to 6 tablets 3x a day as needed for pain 90 capsule 2  . glucose blood (BAYER CONTOUR NEXT TEST) test strip Use to test blood sugar 3 times daily as instructed. Dx code: 250.62 100 each 11  . hydrochlorothiazide (MICROZIDE) 12.5 MG capsule Take 1 capsule (12.5 mg total) by mouth daily. 90 capsule 3  . hydrocortisone 2.5 % cream Apply 1 application topically 2 (two) times daily.    . hydroxyurea (HYDREA) 500 MG capsule Take 1 capsule (500 mg total) by mouth daily. May take with food to minimize GI side effects. 90 capsule 0  . insulin regular human CONCENTRATED (HUMULIN R) 500 UNIT/ML SOLN injection Inject under skin  66 units before breakfast and dinner 20 mL 2  . ketoconazole (NIZORAL) 2 % cream Apply topically daily. Apply to rash daily for 10 days 15 g 1  . loperamide (IMODIUM A-D) 2 MG tablet Take 2 mg by mouth daily.    . metoprolol (LOPRESSOR) 100 MG tablet Take 1 tablet by mouth daily.    . Metoprolol Tartrate 75 MG TABS Take 75 mg by mouth 2 (two) times daily. 60 tablet 11  . nitroGLYCERIN (NITROSTAT) 0.4 MG SL tablet Place 1 tablet (0.4 mg total) under the tongue every 5 (five) minutes as needed for chest pain. 25 tablet 3  .  NONFORMULARY OR COMPOUNDED ITEM Neuropathy cream: mix Amitriptyline 2%, Lidocaine 2%, and Ketamine 1% - apply on feet 2x a day [facility]. 1 each 2  . nystatin cream (MYCOSTATIN) Apply 1 application topically 2 (two) times daily.    Marland Kitchen omega-3 acid ethyl esters (LOVAZA) 1 G capsule Take 1 capsule (1 g total) by mouth 2 (two) times daily. 60 capsule 11  . ondansetron (ZOFRAN) 8 MG tablet Take 8 mg by mouth every 8 (eight) hours as needed for nausea or vomiting.    Marland Kitchen oxyCODONE-acetaminophen (PERCOCET/ROXICET) 5-325 MG per tablet Take 1 tablet by mouth every 4 (four) hours as needed for moderate pain. 20 tablet 0  . pravastatin (PRAVACHOL) 40 MG tablet Take 1 tablet by mouth daily.    . Probiotic Product (PROBIOTIC DAILY PO) Take by mouth daily.    . ticagrelor (BRILINTA) 90 MG TABS tablet Take 1 tablet (90 mg total) by mouth 2 (two) times daily. 60 tablet 5   No current facility-administered medications on file prior to visit.     Objective:  Objective Physical Exam  Constitutional: She is oriented to person, place, and time. She appears well-developed and well-nourished.  HENT:  Head: Normocephalic and atraumatic.  Eyes: Conjunctivae and EOM are normal.  Neck: Normal range of motion. Neck supple. No JVD present. Carotid bruit is not present. No thyromegaly present.  Cardiovascular: Normal rate, regular rhythm and normal heart sounds.   No murmur heard. Pulmonary/Chest: Effort normal and breath sounds normal. No respiratory distress. She has no wheezes. She has no rales. She exhibits no tenderness.  Musculoskeletal: She exhibits no edema.  Neurological: She is alert and oriented to person, place, and time.  Psychiatric: She has a normal mood and affect.   BP 118/60 mmHg  Pulse 87  Temp(Src) 98.3 F (36.8 C) (Oral)  Ht 5' 4"  (1.626 m)  Wt 216 lb 6.4 oz (98.158 kg)  BMI 37.13 kg/m2  SpO2 95% Wt Readings from Last 3 Encounters:  03/04/15 216 lb 6.4 oz (98.158 kg)  02/27/15  218 lb 3.2 oz (98.975 kg)  02/26/15 214 lb 14.4 oz (97.478 kg)     Lab Results  Component Value Date   WBC 10.5* 02/27/2015   HGB 13.1 02/27/2015   HCT 38.5 02/27/2015   PLT 470* 02/27/2015   GLUCOSE 109* 02/04/2015   CHOL 168 01/27/2015   TRIG 266* 01/27/2015   HDL 27* 01/27/2015   LDLCALC 88 01/27/2015   ALT 18 01/27/2015   AST 68* 01/27/2015   NA 144 02/04/2015   K 3.8 02/04/2015   CL 104 02/04/2015   CREATININE 1.07 02/04/2015   BUN 37* 02/04/2015   CO2 30 02/04/2015   TSH 3.290 01/29/2014   INR 1.19 01/27/2015   HGBA1C 8.8* 01/27/2015    Dg Chest Port 1 View  01/28/2015   CLINICAL  DATA:  Shortness of Breath  EXAM: PORTABLE CHEST - 1 VIEW  COMPARISON:  01/29/2014  FINDINGS: Cardiac shadow remains enlarged. Diffuse interstitial changes are noted consistent with vascular congestion and mild interstitial edema. No focal infiltrate is seen. No bony abnormality is noted.  IMPRESSION: Mild CHF.   Electronically Signed   By: Inez Catalina M.D.   On: 01/28/2015 08:09     Assessment & Plan:  Plan I have discontinued Ms. Simic's lisinopril. I am also having her start on losartan and fluticasone. Additionally, I am having her maintain her hydrocortisone, nystatin cream, aspirin EC, diphenhydrAMINE, ondansetron, Probiotic Product (PROBIOTIC DAILY PO), glucose blood, B-D INS SYR ULTRAFINE 1CC/31G, loperamide, ketoconazole, acetaminophen-codeine, NONFORMULARY OR COMPOUNDED ITEM, gabapentin, insulin regular human CONCENTRATED, amLODipine, atorvastatin, Metoprolol Tartrate, oxyCODONE-acetaminophen, ticagrelor, nitroGLYCERIN, omega-3 acid ethyl esters, metoprolol, pravastatin, hydrochlorothiazide, and hydroxyurea.  Meds ordered this encounter  Medications  . losartan (COZAAR) 100 MG tablet    Sig: Take 1 tablet (100 mg total) by mouth daily.    Dispense:  90 tablet    Refill:  3  . fluticasone (FLONASE) 50 MCG/ACT nasal spray    Sig: Place 2 sprays into both nostrils daily.     Dispense:  16 g    Refill:  6    Problem List Items Addressed This Visit    ST elevation myocardial infarction (STEMI) of inferior wall, initial episode of care    Per cardiology Losartan Home health referral in for nsg and pt      Relevant Medications   losartan (COZAAR) 100 MG tablet   Dry cough    D/c lisinopril Start cozaar rto 2-3 weeks       Other Visit Diagnoses    Estrogen deficiency    -  Primary    Relevant Orders    DG Bone Density    Acute MI anterior wall first episode care        Relevant Medications    losartan (COZAAR) 100 MG tablet    Acute myocardial infarction, initial episode of care        Relevant Medications    losartan (COZAAR) 100 MG tablet    Other Relevant Orders    Ambulatory referral to [place] hypertension        Relevant Medications    losartan (COZAAR) 100 MG tablet    Seasonal allergies        Relevant Medications    fluticasone (FLONASE) 50 MCG/ACT nasal spray       Follow-up: Return in about 1 month (around 04/04/2015), or if symptoms worsen or fail to improve, for check bp.  Garnet Koyanagi, DO

## 2015-03-05 ENCOUNTER — Telehealth: Payer: Self-pay | Admitting: Cardiovascular Disease

## 2015-03-05 ENCOUNTER — Encounter: Payer: Self-pay | Admitting: Internal Medicine

## 2015-03-05 ENCOUNTER — Ambulatory Visit (INDEPENDENT_AMBULATORY_CARE_PROVIDER_SITE_OTHER): Payer: Medicare Other | Admitting: Internal Medicine

## 2015-03-05 ENCOUNTER — Ambulatory Visit: Payer: Medicare Other | Admitting: Internal Medicine

## 2015-03-05 VITALS — BP 124/78 | HR 102 | Temp 98.1°F | Resp 12 | Wt 214.0 lb

## 2015-03-05 DIAGNOSIS — E1149 Type 2 diabetes mellitus with other diabetic neurological complication: Secondary | ICD-10-CM

## 2015-03-05 DIAGNOSIS — E1141 Type 2 diabetes mellitus with diabetic mononeuropathy: Secondary | ICD-10-CM | POA: Diagnosis not present

## 2015-03-05 DIAGNOSIS — IMO0002 Reserved for concepts with insufficient information to code with codable children: Secondary | ICD-10-CM

## 2015-03-05 DIAGNOSIS — E1165 Type 2 diabetes mellitus with hyperglycemia: Principal | ICD-10-CM

## 2015-03-05 NOTE — Progress Notes (Signed)
Patient ID: Amanda [name], female   DOB: May 05, 1941, 74 y.o.   MRN: [id number]  HPI: Amanda [name] is a 74 y.o.-year-old female, returning for f/u for DM2, dx 2008, insulin-dependent since dx, uncontrolled, with complications (DR, PN, CAD). Last visit 1.5 mo ago.  On 01/27/2015 she had an AMI >> had 2 stents placed. She had very erratic sugars in the hospital, not checking at home!  She has weight loss after the AMI, and also her PV and neuropathy are better!  For her peripheral neuropathy, which is worse >> saw PCP, podiatrist, orthopedic >> tried capsaicin cream, compression stockings, then Neurontin >> on 600 mg 3x a day.  Last hemoglobin A1c was: 01/27/2015: HbA1c from fructosamine: 6.57% 05/16/2014: HbA1c from fructosamine: 7.95%. Lab Results  Component Value Date   HGBA1C 8.8* 01/27/2015   HGBA1C 9.8* 05/16/2014   HGBA1C 13.5* 01/31/2014  - in 10/16/2012, hemoglobin A1c was 6.9% - in 07/2012, hemoglobin A1c was 9.9% - in 11/16/2010, hemoglobin A1c was 12.8% - in 03/2011, hemoglobin A1c was 6.9% - In 07/10/2010 her hemoglobin A1c was 10.5% C-peptide was 6.3 and 01/2011.  Pt was on a regimen of: - Levemir 45 units 2x a day, in am and at bedtime - Humalog mealtime: 40 units with a smaller meal 45 units with a larger meal  - Sliding Scale of Humalog:  - 150- 165: + 1 unit  - 166- 180: + 2 units  - 181- 195: + 3 units  - 196- 210: + 4 units  - 211- 225: + 5 units  - 226- 240: + 6 units  - >240: + 7 units  She could not tolerate doses of metformin XR higher than 250 mg twice a day in the past due to diarrhea. She does not want to resume this. Was previously on the Victoza 1.8 mg daily - "made me sick". She was previously on Janumet between 2012-2013. She was on Invokana - but could not afford it while in the Medicare doughnut hole.  We switched to U500: - 15 units - on the syringe (0.15 mL) before breakfast >> 20 units - 18 units - on the syringe (0.18 mL) before dinner  >> 20 units  Pt stopped checking sugars at home -we reviewed the sugars from last time: - am: 190-255 >> 209-347 >> same >> 90-211, 235  - 2h after b'fast: n/c >> 238 - before lunch: 140, 223, 247 >> 161-319 >> 258-290 >> 167-343 >> 175-340 >> 238 - 2h after lunch: 240-312 >> n/c >> 270-456 >> 112-218 - before dinner: 150-330 >> 150-170 >> 90 x1, 315 >> 175-336 >> 147, 273 - bedtime: 152-306 >> n/c >> 161-326 >> 164, 240, 240, 268 >> 273 No lows. Lowest sugar was 90x1; she has hypoglycemia awareness at 100. Highest sugar was 450s >> 280s.  Pt's meals are - reviewed per last visit: - Breakfast: ("I am not a breakfast eater") 1 egg + toast; 3 pancakes + sugarfree syrup; waffle; french toast - not skipping anymore  - Lunch: sometimes skips, sometimes eats out: pizza, salad, no dessert - Dinner: at 5:30-6pm: soup, meat + vegetables + starch, dessert - Snacks: 8 pm: fruit, sugar free  She was seen for nutritional advice and diabetes education, with last visit on 04/2012 - [name].  - no CKD, last BUN/creatinine:  Lab Results  Component Value Date   BUN 37* 02/04/2015   CREATININE 1.07 02/04/2015  She is on ACEI. Last microalbumin to creatinine ratio was  19.2 in 09/2012. - last set of lipids: Lab Results  Component Value Date   CHOL 168 01/27/2015   HDL 27* 01/27/2015   LDLCALC 88 01/27/2015   TRIG 266* 01/27/2015   CHOLHDL 6.2 01/27/2015  She is on Pravastatin. - last eye exam was in 07/2013. 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 At last eye exam (Dr Zigmund Daniel): cataracts. - no numbness and tingling in her feet. She is on aspirin 81 mg daily.  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  ROS: Constitutional: no weight gain/loss, no fatigue Eyes: no blurry vision, no xerophthalmia ENT:  no sore throat, no nodules palpated in throat, no dysphagia/odynophagia, no hoarseness Cardiovascular: no CP/SOB/palpitations/ +leg swelling Respiratory:no  cough/no SOB Gastrointestinal: no N/V/D/no C Musculoskeletal: no muscle/joint aches Skin: No rashes Neurological: no tremors/numbness/tingling/dizziness  I reviewed pt's medications, allergies, PMH, social hx, family hx, and changes were documented in the history of present illness. Otherwise, unchanged from my initial visit note. Hydroxyurea dose has been decreased to 1x a day.  PE: BP 124/78 mmHg  Pulse 102  Temp(Src) 98.1 F (36.7 C) (Oral)  Resp 12  Wt 214 lb (97.07 kg)  SpO2 96% Body mass index is 36.72 kg/(m^2). Wt Readings from Last 3 Encounters:  03/05/15 214 lb (97.07 kg)  03/04/15 216 lb 6.4 oz (98.158 kg)  02/27/15 218 lb 3.2 oz (98.975 kg)   Constitutional: overweight, in NAD Eyes: PERRLA, EOMI, no exophthalmos ENT: moist mucous membranes, no thyromegaly, no cervical lymphadenopathy Cardiovascular: tachycardia RR, No MRG, no leg swelling Respiratory: CTA B Gastrointestinal: abdomen soft, NT, ND, BS+ Musculoskeletal: no deformities, strength intact in all 4 Skin: moist, warm  ASSESSMENT: 1. 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 Diabetic peripheral neuropathy  PLAN:  1. Patient with long-standing, uncontrolled diabetes, doing much better on U500, but she does not bring any sugars today. I advised her to start checking and writing them down and bring them at next visit. Also, call with sugars in 3 days as I believe she needs less U500 now after her stents. Will decrease the U500. - I advised her to: Patient Instructions  Please decrease the U500 insulin: 15 units 2x a day before meals.  Please return in 2 months with your sugar log.   Call with sugars on Friday.   - advised her to continue checking her sugars at different times of the  day - check 3 times a day, rotating checks - needs a new eye exam (Dr. Zigmund Daniel)  >> advised to schedule - Return to clinic in 2 months with sugar log  2. Diabetic peripheral neuropathy - on Neurontin to 600 mg 3 times a day - on neuropathy cream: mix Amitriptyline 2%, Lidocaine 2%, and Ketamine 1% - apply on feet 2x a day - better after her stents

## 2015-03-05 NOTE — Telephone Encounter (Signed)
[name] came in to drop off paperwork for [employer] assistance. Checked for Brilinta samples - we are out. Pt has 9 days left of supply. Instructed to contact [employer] office for samples.  Filled out paperwork, left w/ Mariann Laster to obtain Dr. Evette Georges signature.

## 2015-03-05 NOTE — Telephone Encounter (Signed)
Pt has a form from [employer] to help with her Brilinta. He would like to bring them up here asap.Please call,he will give you all the details.

## 2015-03-05 NOTE — Patient Instructions (Signed)
Please decrease the U500 insulin: 15 units 2x a day before meals.  Please return in 2 months with your sugar log.   Call with sugars on Friday.

## 2015-03-05 NOTE — Telephone Encounter (Signed)
[name] returned call - states he will bring paperwork by today.

## 2015-03-05 NOTE — Telephone Encounter (Signed)
Left message instructing to return call if needed, o/w bring relevant paperwork to front desk.

## 2015-03-06 ENCOUNTER — Telehealth: Payer: Self-pay

## 2015-03-06 NOTE — Telephone Encounter (Signed)
Patient's husband called for samples of brilintia placed samples up front

## 2015-03-07 ENCOUNTER — Telehealth: Payer: Self-pay

## 2015-03-07 DIAGNOSIS — M6281 Muscle weakness (generalized): Secondary | ICD-10-CM | POA: Diagnosis not present

## 2015-03-07 DIAGNOSIS — I10 Essential (primary) hypertension: Secondary | ICD-10-CM | POA: Diagnosis not present

## 2015-03-07 DIAGNOSIS — R269 Unspecified abnormalities of gait and mobility: Secondary | ICD-10-CM | POA: Diagnosis not present

## 2015-03-07 DIAGNOSIS — E119 Type 2 diabetes mellitus without complications: Secondary | ICD-10-CM | POA: Diagnosis not present

## 2015-03-07 DIAGNOSIS — I213 ST elevation (STEMI) myocardial infarction of unspecified site: Secondary | ICD-10-CM | POA: Diagnosis not present

## 2015-03-07 NOTE — Telephone Encounter (Signed)
Cindy Investment banker, corporate) with [facility] called on behalf of the patient to inquire dosing for Metoprolol. Patient has 2 prescriptions: #1 for 163m daily and #2 for 738mbid. Information presented to PCP. Enacted verbal order. Per chart, we did not prescribe, but she would use the most recent change on 02/04/2015. Option #2, Metoprolol 7532mid. Relayed information to [employer] patient phone.

## 2015-03-10 ENCOUNTER — Telehealth: Payer: Self-pay | Admitting: Cardiovascular Disease

## 2015-03-10 MED ORDER — METOPROLOL TARTRATE 50 MG PO TABS: 75.0000 mg | ORAL_TABLET | Freq: Two times a day (BID) | ORAL | Status: DC

## 2015-03-10 NOTE — Telephone Encounter (Signed)
Amanda [name] is calling back to speak with [employer]

## 2015-03-10 NOTE — Telephone Encounter (Signed)
Spoke to [employer], home health nurse Jenny Reichmann states she was assessing patient medication last Friday.  vital signs 120/54 pulse 80 Cindy states patient's medication list has 2 entries  Metoprolol tart. 100 mg twice a day and Metoprolol tart 75 mg twice a day from 02/25/15 after summary instruction visit.  The patient has been taking Metoprolol tart. 100 mg daily only. Jenny Reichmann states the patient and husband is having a hard time managing The patient medications. RN reviewed patient's [facility] chart. In review, on 12/23/14 with primary. Patient was taking metoprolol tart 100 mg twice a day. Patient was admitted to hospital in 4 /11/16. Medication was decrease to Metoprolol 75 mg twice a day on 02/04/15. Patient was discharge with this dose.  Patient had an appointment on 5/10 /16 with an extender- --Under the impression that patient was taking 75 mg twice a day,but patient has been taking 100 mg metoprolol tart. Daily RN informed Jenny Reichmann- to follow last office note metoprolol 75 mg twice a day. ( patient should have a prescription for medications probably 50 mg tablet and take 1 and 1/2tablets to equal 75 mg.) Jenny Reichmann states she will contact patient and husband and verify if needed will call back for new prescription.  Jenny Reichmann states patient will be seen twice a week , prn  Until medication taken care of , will assist patient in equipment to monitor medications and vital signs. RN SENT INFORMATION TO Dr [name] and Lyda Jester PA

## 2015-03-10 NOTE — Telephone Encounter (Signed)
SPOKE TO [place] PATIENT  DOES NOT HAVE METOPROLOL TART. 50 MG TABLETS AND SHE HAS NEVER HAD PRESCRIPTION FOR THAT DOSE. PATIENT WOULD LIKE THE MEDICATION  TO BE SENT TO [facility]. RN INFORMED CINDY RN WILL E-SENT MEDICATIONS. UNTIL MEDICATION ARRIVES TAKE 1/2 TABLET OF 100 MG TO EQUAL 50 MG. 1/4 TABLET OF THE 1/2 TABLET TO EQUAL 25 MG. CINDY STATES SHE WILL INFORM PATIENT. E-SENT TO [facility]

## 2015-03-12 ENCOUNTER — Telehealth: Payer: Self-pay | Admitting: Cardiovascular Disease

## 2015-03-12 DIAGNOSIS — I10 Essential (primary) hypertension: Secondary | ICD-10-CM | POA: Diagnosis not present

## 2015-03-12 DIAGNOSIS — M6281 Muscle weakness (generalized): Secondary | ICD-10-CM | POA: Diagnosis not present

## 2015-03-12 DIAGNOSIS — R269 Unspecified abnormalities of gait and mobility: Secondary | ICD-10-CM | POA: Diagnosis not present

## 2015-03-12 DIAGNOSIS — I213 ST elevation (STEMI) myocardial infarction of unspecified site: Secondary | ICD-10-CM | POA: Diagnosis not present

## 2015-03-12 DIAGNOSIS — E119 Type 2 diabetes mellitus without complications: Secondary | ICD-10-CM | POA: Diagnosis not present

## 2015-03-12 NOTE — Telephone Encounter (Signed)
Has the paperwork that was dropped off the other day from [employer] been completed?

## 2015-03-12 NOTE — Telephone Encounter (Signed)
FORWARD TO [place]

## 2015-03-12 NOTE — Telephone Encounter (Signed)
Varnisha RN WANTED TO KNOW IF WE FOLLOW PATIENT 'S DIABETES INFORMED HER THAT Dr Cruzita Lederer manage. She thanked Therapist, sports and states she will call the her office.

## 2015-03-12 NOTE — Telephone Encounter (Signed)
ok 

## 2015-03-13 DIAGNOSIS — I213 ST elevation (STEMI) myocardial infarction of unspecified site: Secondary | ICD-10-CM | POA: Diagnosis not present

## 2015-03-13 DIAGNOSIS — R269 Unspecified abnormalities of gait and mobility: Secondary | ICD-10-CM | POA: Diagnosis not present

## 2015-03-13 DIAGNOSIS — E119 Type 2 diabetes mellitus without complications: Secondary | ICD-10-CM | POA: Diagnosis not present

## 2015-03-13 DIAGNOSIS — I10 Essential (primary) hypertension: Secondary | ICD-10-CM | POA: Diagnosis not present

## 2015-03-13 DIAGNOSIS — M6281 Muscle weakness (generalized): Secondary | ICD-10-CM | POA: Diagnosis not present

## 2015-03-15 DIAGNOSIS — I10 Essential (primary) hypertension: Secondary | ICD-10-CM | POA: Diagnosis not present

## 2015-03-15 DIAGNOSIS — E119 Type 2 diabetes mellitus without complications: Secondary | ICD-10-CM | POA: Diagnosis not present

## 2015-03-15 DIAGNOSIS — R269 Unspecified abnormalities of gait and mobility: Secondary | ICD-10-CM | POA: Diagnosis not present

## 2015-03-15 DIAGNOSIS — I213 ST elevation (STEMI) myocardial infarction of unspecified site: Secondary | ICD-10-CM | POA: Diagnosis not present

## 2015-03-15 DIAGNOSIS — M6281 Muscle weakness (generalized): Secondary | ICD-10-CM | POA: Diagnosis not present

## 2015-03-19 DIAGNOSIS — R269 Unspecified abnormalities of gait and mobility: Secondary | ICD-10-CM | POA: Diagnosis not present

## 2015-03-19 DIAGNOSIS — E119 Type 2 diabetes mellitus without complications: Secondary | ICD-10-CM | POA: Diagnosis not present

## 2015-03-19 DIAGNOSIS — I213 ST elevation (STEMI) myocardial infarction of unspecified site: Secondary | ICD-10-CM | POA: Diagnosis not present

## 2015-03-19 DIAGNOSIS — I10 Essential (primary) hypertension: Secondary | ICD-10-CM | POA: Diagnosis not present

## 2015-03-19 DIAGNOSIS — M6281 Muscle weakness (generalized): Secondary | ICD-10-CM | POA: Diagnosis not present

## 2015-03-20 DIAGNOSIS — E119 Type 2 diabetes mellitus without complications: Secondary | ICD-10-CM | POA: Diagnosis not present

## 2015-03-20 DIAGNOSIS — I213 ST elevation (STEMI) myocardial infarction of unspecified site: Secondary | ICD-10-CM | POA: Diagnosis not present

## 2015-03-20 DIAGNOSIS — M6281 Muscle weakness (generalized): Secondary | ICD-10-CM | POA: Diagnosis not present

## 2015-03-20 DIAGNOSIS — I10 Essential (primary) hypertension: Secondary | ICD-10-CM | POA: Diagnosis not present

## 2015-03-20 DIAGNOSIS — R269 Unspecified abnormalities of gait and mobility: Secondary | ICD-10-CM | POA: Diagnosis not present

## 2015-03-21 ENCOUNTER — Telehealth: Payer: Self-pay | Admitting: Family Medicine

## 2015-03-21 DIAGNOSIS — R269 Unspecified abnormalities of gait and mobility: Secondary | ICD-10-CM | POA: Diagnosis not present

## 2015-03-21 DIAGNOSIS — E119 Type 2 diabetes mellitus without complications: Secondary | ICD-10-CM | POA: Diagnosis not present

## 2015-03-21 DIAGNOSIS — M6281 Muscle weakness (generalized): Secondary | ICD-10-CM | POA: Diagnosis not present

## 2015-03-21 DIAGNOSIS — I213 ST elevation (STEMI) myocardial infarction of unspecified site: Secondary | ICD-10-CM | POA: Diagnosis not present

## 2015-03-21 DIAGNOSIS — I10 Essential (primary) hypertension: Secondary | ICD-10-CM | POA: Diagnosis not present

## 2015-03-21 NOTE — Telephone Encounter (Signed)
Caller name: Verdis Frederickson, physical therapist at [facility] Relation to pt: Call back number: [phone] Pharmacy:  Reason for call:   Requesting verbal order to extend PT for patient for one more week. Twice a week.

## 2015-03-21 NOTE — Telephone Encounter (Signed)
Verbal given.     KP

## 2015-03-24 DIAGNOSIS — E119 Type 2 diabetes mellitus without complications: Secondary | ICD-10-CM | POA: Diagnosis not present

## 2015-03-24 DIAGNOSIS — I213 ST elevation (STEMI) myocardial infarction of unspecified site: Secondary | ICD-10-CM | POA: Diagnosis not present

## 2015-03-24 DIAGNOSIS — M6281 Muscle weakness (generalized): Secondary | ICD-10-CM | POA: Diagnosis not present

## 2015-03-24 DIAGNOSIS — R269 Unspecified abnormalities of gait and mobility: Secondary | ICD-10-CM | POA: Diagnosis not present

## 2015-03-24 DIAGNOSIS — I10 Essential (primary) hypertension: Secondary | ICD-10-CM | POA: Diagnosis not present

## 2015-03-25 ENCOUNTER — Telehealth (HOSPITAL_COMMUNITY): Payer: Self-pay | Admitting: Cardiac Rehabilitation

## 2015-03-25 DIAGNOSIS — E119 Type 2 diabetes mellitus without complications: Secondary | ICD-10-CM | POA: Diagnosis not present

## 2015-03-25 DIAGNOSIS — I213 ST elevation (STEMI) myocardial infarction of unspecified site: Secondary | ICD-10-CM | POA: Diagnosis not present

## 2015-03-25 DIAGNOSIS — R269 Unspecified abnormalities of gait and mobility: Secondary | ICD-10-CM | POA: Diagnosis not present

## 2015-03-25 DIAGNOSIS — M6281 Muscle weakness (generalized): Secondary | ICD-10-CM | POA: Diagnosis not present

## 2015-03-25 DIAGNOSIS — I10 Essential (primary) hypertension: Secondary | ICD-10-CM | POA: Diagnosis not present

## 2015-03-25 NOTE — Telephone Encounter (Signed)
Per [place] health care representative, verification of cardiac rehab benefits:  no preauth necessary, 0 deductible, $6700 oop max, $2310 met to date.  $50 copay per visit until oop max met.  Pt made aware. Pt states she is not able to afford $50 copay and requests cardiac rehab appt cancelled.   Pt offered cardiac maintenance program which she also declined due to cost.  Dr. [name] made aware.

## 2015-03-26 ENCOUNTER — Telehealth: Payer: Self-pay | Admitting: Cardiovascular Disease

## 2015-03-26 DIAGNOSIS — I10 Essential (primary) hypertension: Secondary | ICD-10-CM | POA: Diagnosis not present

## 2015-03-26 DIAGNOSIS — I213 ST elevation (STEMI) myocardial infarction of unspecified site: Secondary | ICD-10-CM | POA: Diagnosis not present

## 2015-03-26 DIAGNOSIS — R269 Unspecified abnormalities of gait and mobility: Secondary | ICD-10-CM | POA: Diagnosis not present

## 2015-03-26 DIAGNOSIS — E119 Type 2 diabetes mellitus without complications: Secondary | ICD-10-CM | POA: Diagnosis not present

## 2015-03-26 DIAGNOSIS — M6281 Muscle weakness (generalized): Secondary | ICD-10-CM | POA: Diagnosis not present

## 2015-03-26 NOTE — Telephone Encounter (Signed)
She had a recent STEMI was placed on aspirin Brilenta. Unless she is having active bleeding I would remain on blood type of therapy

## 2015-03-26 NOTE — Telephone Encounter (Signed)
Arbie Cookey from [facility] called to inform me pt was having bruising w/ brilinta.  Concerned also that pt having surficial bleeding above the skin associated w/ some of the bruising sites.  Pt denies other unusual bleeding from gums, rectum, mucosa, etc.  Notes bleeding as "a trickle of blood". She has noted this on more than one occasion, happens in arms or lower legs. She denies unusual swelling, pain, etc - unconcerned for any edema.  Advised pt to remain on current dose for now, would defer to Dr. [name] to see if med dose change/other intervention advised.  Instructed pt to go to UC or ED for worrisome bleeding esp from nose or gums, go to ED for blood in stool.   Note Dr. [name] is out of office this afternoon. Will call back w/ further instructions from [employer].

## 2015-03-27 NOTE — Telephone Encounter (Signed)
Informed patient and home health nurse Arbie Cookey) of Dr Gwenlyn Found answer. Verbalized understanding.

## 2015-03-28 ENCOUNTER — Telehealth: Payer: Self-pay | Admitting: Family Medicine

## 2015-03-28 NOTE — Telephone Encounter (Signed)
Verbal has been given to extend PT for 3 more weeks.    KP

## 2015-03-28 NOTE — Telephone Encounter (Signed)
Caller name: Verdis Frederickson with [facility] Relation to pt: Call back number: [phone] Pharmacy:  Reason for call:   Request to extend PT twice a week for three weeks. Patient has decided not to go to outpatient PT anymore because of the copay.

## 2015-03-31 ENCOUNTER — Other Ambulatory Visit (HOSPITAL_BASED_OUTPATIENT_CLINIC_OR_DEPARTMENT_OTHER): Payer: Medicare Other

## 2015-03-31 ENCOUNTER — Ambulatory Visit (HOSPITAL_BASED_OUTPATIENT_CLINIC_OR_DEPARTMENT_OTHER): Payer: Medicare Other | Admitting: Hematology and Oncology

## 2015-03-31 ENCOUNTER — Encounter: Payer: Self-pay | Admitting: Hematology and Oncology

## 2015-03-31 ENCOUNTER — Telehealth: Payer: Self-pay | Admitting: Hematology and Oncology

## 2015-03-31 VITALS — BP 163/58 | HR 87 | Temp 97.8°F | Resp 20 | Ht 64.0 in | Wt 216.6 lb

## 2015-03-31 DIAGNOSIS — R233 Spontaneous ecchymoses: Secondary | ICD-10-CM | POA: Insufficient documentation

## 2015-03-31 DIAGNOSIS — D45 Polycythemia vera: Secondary | ICD-10-CM

## 2015-03-31 DIAGNOSIS — I10 Essential (primary) hypertension: Secondary | ICD-10-CM | POA: Diagnosis not present

## 2015-03-31 DIAGNOSIS — R238 Other skin changes: Secondary | ICD-10-CM

## 2015-03-31 LAB — CBC WITH DIFFERENTIAL/PLATELET
BASO%: 1.1 % (ref 0.0–2.0)
Basophils Absolute: 0.1 10*3/uL (ref 0.0–0.1)
EOS%: 4.2 % (ref 0.0–7.0)
Eosinophils Absolute: 0.5 10*3/uL (ref 0.0–0.5)
HEMATOCRIT: 34.8 % (ref 34.8–46.6)
HGB: 11.6 g/dL (ref 11.6–15.9)
LYMPH#: 1.7 10*3/uL (ref 0.9–3.3)
LYMPH%: 13.2 % — AB (ref 14.0–49.7)
MCH: 33.2 pg (ref 25.1–34.0)
MCHC: 33.4 g/dL (ref 31.5–36.0)
MCV: 99.5 fL (ref 79.5–101.0)
MONO#: 0.7 10*3/uL (ref 0.1–0.9)
MONO%: 5.6 % (ref 0.0–14.0)
NEUT#: 9.9 10*3/uL — ABNORMAL HIGH (ref 1.5–6.5)
NEUT%: 75.9 % (ref 38.4–76.8)
Platelets: 723 10*3/uL — ABNORMAL HIGH (ref 145–400)
RBC: 3.5 10*6/uL — ABNORMAL LOW (ref 3.70–5.45)
RDW: 15 % — ABNORMAL HIGH (ref 11.2–14.5)
WBC: 13.1 10*3/uL — AB (ref 3.9–10.3)

## 2015-03-31 LAB — TECHNOLOGIST REVIEW

## 2015-03-31 NOTE — Assessment & Plan Note (Signed)
She has easy skin bruising from her anti-platelet agents. We will monitor for bleeding. Continue the same

## 2015-03-31 NOTE — Telephone Encounter (Signed)
Gave and printed appt sched and avs for pt for July

## 2015-03-31 NOTE — Assessment & Plan Note (Signed)
She has missed many days of hydroxyurea because of recent heart attack. In May 2015, I restarted hydroxyurea at 500 mg daily and now her CBC is abnormal I recommend increasing hydroxyurea to 1000 mg daily and recheck next month.

## 2015-03-31 NOTE — Progress Notes (Signed)
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 The patient had bone marrow aspirate and biopsy performed in [place] and confirmed myeloproliferative disorder. We do not have records of those results. She had been treated with phlebotomy, hydroxyurea and aspirin.  The patient also have liver cirrhosis with splenomegaly, on observation On 07/04/2014, hydroxyurea was increa sed to 2 tablets daily except on Saturdays and Sundays she take 3 tablets On 09/30/2014, I increase hydroxyurea to 3 tablets daily In April 2016, she had heart attack with stent placement. She was taken off hydroxyurea On 02/27/15: Hydrea resumed at 500 mg daily  INTERVAL HISTORY: Please see below for problem oriented charting. She feels well She complained of skin bruising The patient denies any recent signs or symptoms of bleeding such as spontaneous epistaxis, hematuria or hematochezia.   REVIEW OF SYSTEMS:   Constitutional: Denies fevers, chills or abnormal weight loss Eyes: Denies blurriness of vision Ears, nose, mouth, throat, and face: Denies mucositis or sore throat Respiratory: Denies cough, dyspnea or wheezes Cardiovascular: Denies palpitation, chest discomfort or lower extremity swelling Gastrointestinal:  Denies nausea, heartburn or change in bowel habits Skin: Denies abnormal skin rashes Lymphatics: Denies new lymphadenopathy  Neurological:Denies numbness, tingling or new weaknesses Behavioral/Psych: Mood is stable, no new  changes  All other systems were reviewed with the patient and are negative.  I have reviewed the past medical history, past surgical history, social history and family history with the patient and they are unchanged from previous note.  ALLERGIES:  is allergic to tape; doxycycline; and latex.  MEDICATIONS:  Current Outpatient Prescriptions  Medication Sig Dispense Refill  . acetaminophen-codeine (TYLENOL #3) 300-30 MG per tablet Take one or two tablets every 6 hours prn pain 30 tablet 0  . amLODipine (NORVASC) 10 MG tablet Take 1 tablet (10 mg total) by mouth daily. 30 tablet 11  . aspirin EC 81 MG tablet Take 81 mg by mouth daily.    Marland Kitchen atorvastatin (LIPITOR) 80 MG tablet Take 1 tablet (80 mg total) by mouth daily at 6 PM. 30 tablet 11  . B-D INS SYR ULTRAFINE 1CC/31G 31G X 5/16" 1 ML MISC     . diphenhydrAMINE (BENADRYL) 25 mg capsule Take 25 mg by mouth every 6 (six) hours as needed.    . fluticasone (FLONASE) 50 MCG/ACT nasal spray Place 2 sprays into both nostrils daily. 16 g 6  . gabapentin (NEURONTIN) 100 MG capsule Take up to 6 tablets 3x a day as needed for pain 90 capsule 2  . glucose blood (BAYER CONTOUR NEXT TEST) test strip Use to test blood sugar 3 times daily as instructed. Dx code: 250.62 100 each 11  . hydrochlorothiazide (MICROZIDE) 12.5 MG capsule Take 1 capsule (12.5 mg total) by mouth daily. 90 capsule 3  . hydrocortisone 2.5 % cream Apply 1 application topically 2 (two) times daily.    . hydroxyurea (HYDREA) 500 MG capsule Take 1 capsule (500 mg total) by mouth daily. May take with food to minimize GI side effects. 90 capsule 0  . insulin regular human CONCENTRATED (HUMULIN  R) 500 UNIT/ML SOLN injection Inject under skin 66 units before breakfast and dinner 20 mL 2  . ketoconazole (NIZORAL) 2 % cream Apply topically daily. Apply to rash daily for 10 days 15 g 1  . loperamide (IMODIUM A-D) 2 MG tablet Take 2 mg by mouth daily.    Marland Kitchen losartan (COZAAR) 100 MG tablet Take 1  tablet (100 mg total) by mouth daily. 90 tablet 3  . metoprolol (LOPRESSOR) 50 MG tablet Take 1.5 tablets (75 mg total) by mouth 2 (two) times daily. 270 tablet 3  . nitroGLYCERIN (NITROSTAT) 0.4 MG SL tablet Place 1 tablet (0.4 mg total) under the tongue every 5 (five) minutes as needed for chest pain. 25 tablet 3  . NONFORMULARY OR COMPOUNDED ITEM Neuropathy cream: mix Amitriptyline 2%, Lidocaine 2%, and Ketamine 1% - apply on feet 2x a day [facility]. 1 each 2  . nystatin cream (MYCOSTATIN) Apply 1 application topically 2 (two) times daily.    Marland Kitchen omega-3 acid ethyl esters (LOVAZA) 1 G capsule Take 1 capsule (1 g total) by mouth 2 (two) times daily. 60 capsule 11  . ondansetron (ZOFRAN) 8 MG tablet Take 8 mg by mouth every 8 (eight) hours as needed for nausea or vomiting.    Marland Kitchen oxyCODONE-acetaminophen (PERCOCET/ROXICET) 5-325 MG per tablet Take 1 tablet by mouth every 4 (four) hours as needed for moderate pain. 20 tablet 0  . pravastatin (PRAVACHOL) 40 MG tablet Take 1 tablet by mouth daily.    . Probiotic Product (PROBIOTIC DAILY PO) Take by mouth daily.    . ticagrelor (BRILINTA) 90 MG TABS tablet Take 1 tablet (90 mg total) by mouth 2 (two) times daily. 60 tablet 5   No current facility-administered medications for this visit.    PHYSICAL EXAMINATION: ECOG PERFORMANCE STATUS: 0 - Asymptomatic  Filed Vitals:   03/31/15 0815  BP: 163/58  Pulse: 87  Temp: 97.8 F (36.6 C)  Resp: 20   Filed Weights   03/31/15 0815  Weight: 216 lb 9.6 oz (98.249 kg)    GENERAL:alert, no distress and comfortable SKIN: skin color, texture, turgor are normal, no rashes or significant lesions. Noted skin bruising EYES: normal, Conjunctiva are pink and non-injected, sclera clear OROPHARYNX:no exudate, no erythema and lips, buccal mucosa, and tongue normal  NEURO: alert & oriented x 3 with fluent speech, no focal motor/sensory deficits  LABORATORY DATA:  I have reviewed the data as listed     Component Value Date/Time   NA 144 02/04/2015 0411   NA 133* 12/10/2013 1239   K 3.8 02/04/2015 0411   K 4.0 12/10/2013 1239   CL 104 02/04/2015 0411   CL 103 02/16/2013 0900   CO2 30 02/04/2015 0411   CO2 21* 12/10/2013 1239   GLUCOSE 109* 02/04/2015 0411   GLUCOSE 635* 12/10/2013 1239   GLUCOSE 396* 02/16/2013 0900   GLUCOSE 292 06/02/2010   BUN 37* 02/04/2015 0411   BUN 7.9 12/10/2013 1239   CREATININE 1.07 02/04/2015 0411   CREATININE 1.2* 12/10/2013 1239   CALCIUM 8.9 02/04/2015 0411   CALCIUM 9.5 12/10/2013 1239   PROT 6.2 01/27/2015 1320   PROT 6.7 12/10/2013 1239   ALBUMIN 3.5 01/27/2015 1320   ALBUMIN 3.7 12/10/2013 1239   AST 68* 01/27/2015 1320   AST 27 12/10/2013 1239   ALT 18 01/27/2015 1320   ALT 18 12/10/2013 1239   ALKPHOS 80 01/27/2015 1320   ALKPHOS 108 12/10/2013 1239   BILITOT 1.2 01/27/2015 1320  BILITOT 0.83 12/10/2013 1239   GFRNONAA 49* 02/04/2015 0411   GFRAA 57* 02/04/2015 0411    No results found for: SPEP, UPEP  Lab Results  Component Value Date   WBC 13.1* 03/31/2015   NEUTROABS 9.9* 03/31/2015   HGB 11.6 03/31/2015   HCT 34.8 03/31/2015   MCV 99.5 03/31/2015   PLT 723* 03/31/2015      Chemistry      Component Value Date/Time   NA 144 02/04/2015 0411   NA 133* 12/10/2013 1239   K 3.8 02/04/2015 0411   K 4.0 12/10/2013 1239   CL 104 02/04/2015 0411   CL 103 02/16/2013 0900   CO2 30 02/04/2015 0411   CO2 21* 12/10/2013 1239   BUN 37* 02/04/2015 0411   BUN 7.9 12/10/2013 1239   CREATININE 1.07 02/04/2015 0411   CREATININE 1.2* 12/10/2013 1239      Component Value Date/Time   CALCIUM 8.9 02/04/2015 0411   CALCIUM 9.5 12/10/2013 1239   ALKPHOS 80 01/27/2015 1320   ALKPHOS 108 12/10/2013 1239   AST 68* 01/27/2015 1320   AST 27 12/10/2013 1239   ALT 18 01/27/2015 1320   ALT 18 12/10/2013 1239   BILITOT 1.2 01/27/2015 1320   BILITOT 0.83 12/10/2013 1239      ASSESSMENT & PLAN:  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 In May 2015, I restarted hydroxyurea at 500 mg daily and now her CBC is abnormal I recommend increasing hydroxyurea to 1000 mg daily and recheck next month.  Essential hypertension She is on multiple different medication for this. She does feel and shows every time she comes to this clinic. I recommend aggressive medical management by primary care doctor and cardiologist.  Easy bruising She has easy skin bruising from her anti-platelet agents. We will monitor for bleeding. Continue the same   No orders of the defined types were placed in this encounter.   All questions were answered. The patient knows to call the clinic with any problems, questions or concerns. No barriers to learning was detected. I spent 15 minutes counseling the patient face to face. The total time spent in the appointment was 20 minutes and more than 50% was on counseling and review of test results     [facility], [place], MD 03/31/2015 8:35 AM

## 2015-03-31 NOTE — Assessment & Plan Note (Signed)
She is on multiple different medication for this. She does feel and shows every time she comes to this clinic. I recommend aggressive medical management by primary care doctor and cardiologist.

## 2015-04-01 DIAGNOSIS — E119 Type 2 diabetes mellitus without complications: Secondary | ICD-10-CM | POA: Diagnosis not present

## 2015-04-01 DIAGNOSIS — I10 Essential (primary) hypertension: Secondary | ICD-10-CM | POA: Diagnosis not present

## 2015-04-01 DIAGNOSIS — R269 Unspecified abnormalities of gait and mobility: Secondary | ICD-10-CM | POA: Diagnosis not present

## 2015-04-01 DIAGNOSIS — M6281 Muscle weakness (generalized): Secondary | ICD-10-CM | POA: Diagnosis not present

## 2015-04-01 DIAGNOSIS — I213 ST elevation (STEMI) myocardial infarction of unspecified site: Secondary | ICD-10-CM | POA: Diagnosis not present

## 2015-04-02 DIAGNOSIS — R269 Unspecified abnormalities of gait and mobility: Secondary | ICD-10-CM | POA: Diagnosis not present

## 2015-04-02 DIAGNOSIS — I10 Essential (primary) hypertension: Secondary | ICD-10-CM | POA: Diagnosis not present

## 2015-04-02 DIAGNOSIS — E119 Type 2 diabetes mellitus without complications: Secondary | ICD-10-CM | POA: Diagnosis not present

## 2015-04-02 DIAGNOSIS — I213 ST elevation (STEMI) myocardial infarction of unspecified site: Secondary | ICD-10-CM | POA: Diagnosis not present

## 2015-04-02 DIAGNOSIS — M6281 Muscle weakness (generalized): Secondary | ICD-10-CM | POA: Diagnosis not present

## 2015-04-03 ENCOUNTER — Ambulatory Visit (HOSPITAL_COMMUNITY): Payer: Medicare Other

## 2015-04-07 ENCOUNTER — Ambulatory Visit (HOSPITAL_COMMUNITY): Payer: Medicare Other

## 2015-04-08 ENCOUNTER — Ambulatory Visit (INDEPENDENT_AMBULATORY_CARE_PROVIDER_SITE_OTHER): Payer: Medicare Other | Admitting: Family Medicine

## 2015-04-08 ENCOUNTER — Encounter: Payer: Self-pay | Admitting: Family Medicine

## 2015-04-08 VITALS — BP 126/72 | HR 91 | Temp 98.1°F | Wt 216.0 lb

## 2015-04-08 DIAGNOSIS — E785 Hyperlipidemia, unspecified: Secondary | ICD-10-CM

## 2015-04-08 DIAGNOSIS — K7460 Unspecified cirrhosis of liver: Secondary | ICD-10-CM

## 2015-04-08 DIAGNOSIS — F411 Generalized anxiety disorder: Secondary | ICD-10-CM

## 2015-04-08 DIAGNOSIS — E1141 Type 2 diabetes mellitus with diabetic mononeuropathy: Secondary | ICD-10-CM

## 2015-04-08 DIAGNOSIS — R1013 Epigastric pain: Secondary | ICD-10-CM | POA: Diagnosis not present

## 2015-04-08 DIAGNOSIS — I2121 ST elevation (STEMI) myocardial infarction involving left circumflex coronary artery: Secondary | ICD-10-CM

## 2015-04-08 DIAGNOSIS — R110 Nausea: Secondary | ICD-10-CM

## 2015-04-08 DIAGNOSIS — I10 Essential (primary) hypertension: Secondary | ICD-10-CM | POA: Diagnosis not present

## 2015-04-08 DIAGNOSIS — E1165 Type 2 diabetes mellitus with hyperglycemia: Secondary | ICD-10-CM

## 2015-04-08 DIAGNOSIS — E1149 Type 2 diabetes mellitus with other diabetic neurological complication: Secondary | ICD-10-CM

## 2015-04-08 DIAGNOSIS — IMO0002 Reserved for concepts with insufficient information to code with codable children: Secondary | ICD-10-CM

## 2015-04-08 MED ORDER — ALPRAZOLAM 0.25 MG PO TABS: 0.2500 mg | ORAL_TABLET | Freq: Three times a day (TID) | ORAL | Status: DC | PRN

## 2015-04-08 MED ORDER — RANITIDINE HCL 300 MG PO TABS: 300.0000 mg | ORAL_TABLET | Freq: Every day | ORAL | Status: DC

## 2015-04-08 NOTE — Assessment & Plan Note (Signed)
con't lipitor Check labs

## 2015-04-08 NOTE — Assessment & Plan Note (Signed)
zofran not working Suspect gerd--- zantac 300 mg daily Pt to f/u GI

## 2015-04-08 NOTE — Progress Notes (Signed)
Patient ID: Amanda [name], female    DOB: May 26, 1941  Age: 74 y.o. MRN: [id number]    Subjective:  Subjective HPI Amanda [name] presents for f/u cough / nausea.  She has been vomiting.  She has been unable to leave the house due to this.  zofran is not helping.  The patient thinks its the Lipitor.   She also c/o inc anxiety with all her health problems.    Review of Systems  Constitutional: Negative for diaphoresis, appetite change, fatigue and unexpected weight change.  Eyes: Negative for pain, redness and visual disturbance.  Respiratory: Negative for cough, chest tightness, shortness of breath and wheezing.   Cardiovascular: Negative for chest pain, palpitations and leg swelling.  Endocrine: Negative for cold intolerance, heat intolerance, polydipsia, polyphagia and polyuria.  Genitourinary: Negative for dysuria, frequency and difficulty urinating.  Neurological: Negative for dizziness, light-headedness, numbness and headaches.  Psychiatric/Behavioral: Negative for dysphoric mood and decreased concentration. The patient is not nervous/anxious.     History Past Medical History  Diagnosis Date  . Hypertension   . Hyperlipidemia   . Diabetes mellitus 2008  . Depression   . Obesity   . Psoriasis   . Polycythemia     Dr. Elease Hashimoto- [facility]  . Herpes simplex   . Gout   . Hyperglycemia 05/31/2013  . C. difficile colitis   . Cirrhosis   . Cancer     SKIN  . Neuromuscular disorder     BELL PALSY  . Candidiasis of skin 09/30/2014  . Myocardial infarction     She has past surgical history that includes Breast surgery (Left); Total abdominal hysterectomy w/ bilateral salpingoophorectomy; Tonsillectomy; Colonoscopy; and left heart catheterization with coronary angiogram (N/A, 01/27/2015).   Her family history includes Breast cancer in her maternal aunt; Diabetes in her mother; Heart disease in her maternal grandmother, mother, and other; Hyperlipidemia in her mother;  Hypertension in her mother; Kidney disease in her father and mother.She reports that she quit smoking about 28 years ago. Her smoking use included Cigarettes. She quit after 48 years of use. She has never used smokeless tobacco. She reports that she does not drink alcohol or use illicit drugs.  Current Outpatient Prescriptions on File Prior to Visit  Medication Sig Dispense Refill  . acetaminophen-codeine (TYLENOL #3) 300-30 MG per tablet Take one or two tablets every 6 hours prn pain 30 tablet 0  . amLODipine (NORVASC) 10 MG tablet Take 1 tablet (10 mg total) by mouth daily. 30 tablet 11  . aspirin EC 81 MG tablet Take 81 mg by mouth daily.    Marland Kitchen atorvastatin (LIPITOR) 80 MG tablet Take 1 tablet (80 mg total) by mouth daily at 6 PM. 30 tablet 11  . B-D INS SYR ULTRAFINE 1CC/31G 31G X 5/16" 1 ML MISC     . diphenhydrAMINE (BENADRYL) 25 mg capsule Take 25 mg by mouth every 6 (six) hours as needed.    . fluticasone (FLONASE) 50 MCG/ACT nasal spray Place 2 sprays into both nostrils daily. 16 g 6  . gabapentin (NEURONTIN) 100 MG capsule Take up to 6 tablets 3x a day as needed for pain 90 capsule 2  . glucose blood (BAYER CONTOUR NEXT TEST) test strip Use to test blood sugar 3 times daily as instructed. Dx code: 250.62 100 each 11  . hydrochlorothiazide (MICROZIDE) 12.5 MG capsule Take 1 capsule (12.5 mg total) by mouth daily. 90 capsule 3  . hydrocortisone 2.5 % cream Apply 1 application topically  2 (two) times daily.    . hydroxyurea (HYDREA) 500 MG capsule Take 1 capsule (500 mg total) by mouth daily. May take with food to minimize GI side effects. 90 capsule 0  . insulin regular human CONCENTRATED (HUMULIN R) 500 UNIT/ML SOLN injection Inject under skin 66 units before breakfast and dinner 20 mL 2  . ketoconazole (NIZORAL) 2 % cream Apply topically daily. Apply to rash daily for 10 days 15 g 1  . loperamide (IMODIUM A-D) 2 MG tablet Take 2 mg by mouth daily.    Marland Kitchen losartan (COZAAR) 100 MG tablet Take  1 tablet (100 mg total) by mouth daily. 90 tablet 3  . metoprolol (LOPRESSOR) 50 MG tablet Take 1.5 tablets (75 mg total) by mouth 2 (two) times daily. 270 tablet 3  . nitroGLYCERIN (NITROSTAT) 0.4 MG SL tablet Place 1 tablet (0.4 mg total) under the tongue every 5 (five) minutes as needed for chest pain. 25 tablet 3  . NONFORMULARY OR COMPOUNDED ITEM Neuropathy cream: mix Amitriptyline 2%, Lidocaine 2%, and Ketamine 1% - apply on feet 2x a day [facility]. 1 each 2  . nystatin cream (MYCOSTATIN) Apply 1 application topically 2 (two) times daily.    Marland Kitchen omega-3 acid ethyl esters (LOVAZA) 1 G capsule Take 1 capsule (1 g total) by mouth 2 (two) times daily. 60 capsule 11  . ondansetron (ZOFRAN) 8 MG tablet Take 8 mg by mouth every 8 (eight) hours as needed for nausea or vomiting.    Marland Kitchen oxyCODONE-acetaminophen (PERCOCET/ROXICET) 5-325 MG per tablet Take 1 tablet by mouth every 4 (four) hours as needed for moderate pain. 20 tablet 0  . Probiotic Product (PROBIOTIC DAILY PO) Take by mouth daily.    . ticagrelor (BRILINTA) 90 MG TABS tablet Take 1 tablet (90 mg total) by mouth 2 (two) times daily. 60 tablet 5   No current facility-administered medications on file prior to visit.     Objective:  Objective Physical Exam  Constitutional: She is oriented to person, place, and time. She appears well-developed and well-nourished.  HENT:  Head: Normocephalic and atraumatic.  Eyes: Conjunctivae and EOM are normal.  Neck: Normal range of motion. Neck supple. No JVD present. Carotid bruit is not present. No thyromegaly present.  Cardiovascular: Normal rate, regular rhythm and normal heart sounds.   No murmur heard. Pulmonary/Chest: Effort normal and breath sounds normal. No respiratory distress. She has no wheezes. She has no rales. She exhibits no tenderness.  Abdominal: There is tenderness. There is no rebound and no guarding.  Min mid epigastric tenderness  Musculoskeletal: She exhibits no  edema.  Neurological: She is alert and oriented to person, place, and time.  Psychiatric: She has a normal mood and affect. Her behavior is normal.   BP 126/72 mmHg  Pulse 91  Temp(Src) 98.1 F (36.7 C) (Oral)  Wt 216 lb (97.977 kg)  SpO2 96% Wt Readings from Last 3 Encounters:  04/08/15 216 lb (97.977 kg)  03/31/15 216 lb 9.6 oz (98.249 kg)  03/05/15 214 lb (97.07 kg)     Lab Results  Component Value Date   WBC 13.1* 03/31/2015   HGB 11.6 03/31/2015   HCT 34.8 03/31/2015   PLT 723* 03/31/2015   GLUCOSE 109* 02/04/2015   CHOL 168 01/27/2015   TRIG 266* 01/27/2015   HDL 27* 01/27/2015   LDLCALC 88 01/27/2015   ALT 18 01/27/2015   AST 68* 01/27/2015   NA 144 02/04/2015   K 3.8 02/04/2015  CL 104 02/04/2015   CREATININE 1.07 02/04/2015   BUN 37* 02/04/2015   CO2 30 02/04/2015   TSH 3.290 01/29/2014   INR 1.19 01/27/2015   HGBA1C 8.8* 01/27/2015    Dg Chest Port 1 View  01/28/2015   CLINICAL DATA:  Shortness of Breath  EXAM: PORTABLE CHEST - 1 VIEW  COMPARISON:  01/29/2014  FINDINGS: Cardiac shadow remains enlarged. Diffuse interstitial changes are noted consistent with vascular congestion and mild interstitial edema. No focal infiltrate is seen. No bony abnormality is noted.  IMPRESSION: Mild CHF.   Electronically Signed   By: Inez Catalina M.D.   On: 01/28/2015 08:09     Assessment & Plan:  Plan I have discontinued Ms. Cavan's pravastatin. I am also having her start on ranitidine and ALPRAZolam. Additionally, I am having her maintain her hydrocortisone, nystatin cream, aspirin EC, diphenhydrAMINE, ondansetron, Probiotic Product (PROBIOTIC DAILY PO), glucose blood, B-D INS SYR ULTRAFINE 1CC/31G, loperamide, ketoconazole, acetaminophen-codeine, NONFORMULARY OR COMPOUNDED ITEM, gabapentin, insulin regular human CONCENTRATED, amLODipine, atorvastatin, oxyCODONE-acetaminophen, ticagrelor, nitroGLYCERIN, omega-3 acid ethyl esters, hydrochlorothiazide, hydroxyurea, losartan,  fluticasone, and metoprolol.  Meds ordered this encounter  Medications  . ranitidine (ZANTAC) 300 MG tablet    Sig: Take 1 tablet (300 mg total) by mouth at bedtime.    Dispense:  30 tablet    Refill:  5  . ALPRAZolam (XANAX) 0.25 MG tablet    Sig: Take 1 tablet (0.25 mg total) by mouth 3 (three) times daily as needed for anxiety.    Dispense:  20 tablet    Refill:  0    Problem List Items Addressed This Visit    Type 2 diabetes mellitus with neurological manifestations, uncontrolled (Chronic)    con't insulin F/u endo      ST elevation myocardial infarction (STEMI) involving left circumflex coronary artery in recovery phase    Per cardiology con't brilinta      Nausea without vomiting    zofran not working Suspect gerd--- zantac 300 mg daily Pt to f/u GI      Hyperlipidemia    con't lipitor Check labs      Relevant Orders   POCT urinalysis dipstick   Essential hypertension    Other Visit Diagnoses    Dyspepsia    -  Primary    Relevant Medications    ranitidine (ZANTAC) 300 MG tablet    Cirrhosis of liver without ascites, unspecified hepatic cirrhosis type        Relevant Orders    Ambulatory referral to Gastroenterology    Generalized anxiety disorder        Relevant Medications    ALPRAZolam (XANAX) 0.25 MG tablet       Follow-up: Return in about 6 months (around 10/08/2015), or if symptoms worsen or fail to improve, for annual exam, fasting.  Garnet Koyanagi, DO

## 2015-04-08 NOTE — Assessment & Plan Note (Signed)
Per cardiology con't brilinta

## 2015-04-08 NOTE — Progress Notes (Signed)
Pre visit review using our clinic review tool, if applicable. No additional management support is needed unless otherwise documented below in the visit note. 

## 2015-04-08 NOTE — Patient Instructions (Signed)

## 2015-04-08 NOTE — Assessment & Plan Note (Signed)
con't insulin F/u endo

## 2015-04-09 ENCOUNTER — Telehealth: Payer: Self-pay | Admitting: *Deleted

## 2015-04-09 ENCOUNTER — Telehealth: Payer: Self-pay | Admitting: Physician Assistant

## 2015-04-09 ENCOUNTER — Ambulatory Visit (HOSPITAL_COMMUNITY): Payer: Medicare Other

## 2015-04-09 NOTE — Telephone Encounter (Signed)
Returned patient's PT/ INR order to [place].

## 2015-04-09 NOTE — Telephone Encounter (Signed)
Orders faxed

## 2015-04-09 NOTE — Telephone Encounter (Signed)
Returned cardiac rehab order to [place].

## 2015-04-09 NOTE — Telephone Encounter (Signed)
Patient called and reported BP of 190/98 around 5pm. She went to her PCP yesterday to drawn blood for LFT check. Nurse sticked her 5 times and still unable to get a blood. She got upset and went home.  In morning her BP was 166/80. She got anxious when nurse called her today to reschedule appointment for blood drawn. She still upset and anxious about needle stick while talking on phone with me. Her PCP has prescribed her an anxiety medication per patient. However she hasn't picked up yet. I have advised her to go pick up anxiety medication and take as prescribed. Try not to think about it and take a deep breath.  If BP remains high after taking anxiety medication, I have advised her to take half a pill of metoprolol 58m (total 247m. Her elevated BP likely due to anxiety attack.   Harlo Fabela, PA-C [facility]

## 2015-04-11 ENCOUNTER — Ambulatory Visit (HOSPITAL_COMMUNITY): Payer: Medicare Other

## 2015-04-14 ENCOUNTER — Ambulatory Visit (HOSPITAL_COMMUNITY): Payer: Medicare Other

## 2015-04-15 ENCOUNTER — Telehealth: Payer: Self-pay | Admitting: Cardiovascular Disease

## 2015-04-15 DIAGNOSIS — J449 Chronic obstructive pulmonary disease, unspecified: Secondary | ICD-10-CM | POA: Diagnosis not present

## 2015-04-15 DIAGNOSIS — R0602 Shortness of breath: Secondary | ICD-10-CM | POA: Diagnosis not present

## 2015-04-15 DIAGNOSIS — I10 Essential (primary) hypertension: Secondary | ICD-10-CM | POA: Diagnosis not present

## 2015-04-15 DIAGNOSIS — I519 Heart disease, unspecified: Secondary | ICD-10-CM | POA: Diagnosis not present

## 2015-04-15 NOTE — Telephone Encounter (Signed)
Amanda [name] is calling because she needs a verbal morder to continue with Home Health for 1wk-8 for medication and disease management. Please call   Thanks

## 2015-04-15 NOTE — Telephone Encounter (Signed)
Verbal order given to carol.

## 2015-04-16 ENCOUNTER — Ambulatory Visit (HOSPITAL_COMMUNITY): Payer: Medicare Other

## 2015-04-18 ENCOUNTER — Telehealth: Payer: Self-pay | Admitting: Family Medicine

## 2015-04-18 ENCOUNTER — Ambulatory Visit (HOSPITAL_COMMUNITY): Payer: Medicare Other

## 2015-04-18 DIAGNOSIS — Z87891 Personal history of nicotine dependence: Secondary | ICD-10-CM | POA: Insufficient documentation

## 2015-04-18 DIAGNOSIS — Z794 Long term (current) use of insulin: Secondary | ICD-10-CM | POA: Diagnosis not present

## 2015-04-18 DIAGNOSIS — F411 Generalized anxiety disorder: Secondary | ICD-10-CM | POA: Diagnosis not present

## 2015-04-18 DIAGNOSIS — R197 Diarrhea, unspecified: Secondary | ICD-10-CM | POA: Diagnosis not present

## 2015-04-18 DIAGNOSIS — K529 Noninfective gastroenteritis and colitis, unspecified: Secondary | ICD-10-CM | POA: Diagnosis not present

## 2015-04-18 DIAGNOSIS — E11319 Type 2 diabetes mellitus with unspecified diabetic retinopathy without macular edema: Secondary | ICD-10-CM | POA: Insufficient documentation

## 2015-04-18 DIAGNOSIS — D45 Polycythemia vera: Secondary | ICD-10-CM | POA: Diagnosis not present

## 2015-04-18 DIAGNOSIS — Z955 Presence of coronary angioplasty implant and graft: Secondary | ICD-10-CM | POA: Insufficient documentation

## 2015-04-18 DIAGNOSIS — E119 Type 2 diabetes mellitus without complications: Secondary | ICD-10-CM | POA: Diagnosis not present

## 2015-04-18 DIAGNOSIS — K7460 Unspecified cirrhosis of liver: Secondary | ICD-10-CM | POA: Diagnosis not present

## 2015-04-18 NOTE — Telephone Encounter (Signed)
Verbal given.      KP

## 2015-04-18 NOTE — Telephone Encounter (Signed)
Caller name:Maria   Relation to EU:[id number] Therapist from [place] Call back number: [phone]   Reason for call:  requesting verbal orders for home health physical therapy twice a week for five weeks, gait training, balancing and strengthening

## 2015-04-21 ENCOUNTER — Ambulatory Visit (HOSPITAL_COMMUNITY): Payer: Medicare Other

## 2015-04-22 DIAGNOSIS — M6281 Muscle weakness (generalized): Secondary | ICD-10-CM | POA: Diagnosis not present

## 2015-04-22 DIAGNOSIS — I10 Essential (primary) hypertension: Secondary | ICD-10-CM | POA: Diagnosis not present

## 2015-04-22 DIAGNOSIS — R197 Diarrhea, unspecified: Secondary | ICD-10-CM | POA: Diagnosis not present

## 2015-04-22 DIAGNOSIS — E119 Type 2 diabetes mellitus without complications: Secondary | ICD-10-CM | POA: Diagnosis not present

## 2015-04-22 DIAGNOSIS — I213 ST elevation (STEMI) myocardial infarction of unspecified site: Secondary | ICD-10-CM | POA: Diagnosis not present

## 2015-04-22 DIAGNOSIS — R269 Unspecified abnormalities of gait and mobility: Secondary | ICD-10-CM | POA: Diagnosis not present

## 2015-04-23 ENCOUNTER — Ambulatory Visit (HOSPITAL_COMMUNITY): Payer: Medicare Other

## 2015-04-24 ENCOUNTER — Other Ambulatory Visit (HOSPITAL_BASED_OUTPATIENT_CLINIC_OR_DEPARTMENT_OTHER): Payer: Medicare Other

## 2015-04-24 DIAGNOSIS — D45 Polycythemia vera: Secondary | ICD-10-CM

## 2015-04-24 LAB — CBC WITH DIFFERENTIAL/PLATELET
BASO%: 0.5 % (ref 0.0–2.0)
Basophils Absolute: 0.1 10*3/uL (ref 0.0–0.1)
EOS ABS: 0.7 10*3/uL — AB (ref 0.0–0.5)
EOS%: 3.5 % (ref 0.0–7.0)
HCT: 37 % (ref 34.8–46.6)
HGB: 12.3 g/dL (ref 11.6–15.9)
LYMPH%: 12 % — ABNORMAL LOW (ref 14.0–49.7)
MCH: 32.7 pg (ref 25.1–34.0)
MCHC: 33.2 g/dL (ref 31.5–36.0)
MCV: 98.4 fL (ref 79.5–101.0)
MONO#: 1 10*3/uL — AB (ref 0.1–0.9)
MONO%: 5.3 % (ref 0.0–14.0)
NEUT#: 14.7 10*3/uL — ABNORMAL HIGH (ref 1.5–6.5)
NEUT%: 78.7 % — ABNORMAL HIGH (ref 38.4–76.8)
PLATELETS: 826 10*3/uL — AB (ref 145–400)
RBC: 3.76 10*6/uL (ref 3.70–5.45)
RDW: 14.8 % — AB (ref 11.2–14.5)
WBC: 18.7 10*3/uL — AB (ref 3.9–10.3)
lymph#: 2.2 10*3/uL (ref 0.9–3.3)
nRBC: 0 % (ref 0–0)

## 2015-04-24 LAB — TECHNOLOGIST REVIEW

## 2015-04-25 ENCOUNTER — Ambulatory Visit (HOSPITAL_COMMUNITY): Payer: Medicare Other

## 2015-04-28 ENCOUNTER — Ambulatory Visit (HOSPITAL_COMMUNITY): Payer: Medicare Other

## 2015-04-28 ENCOUNTER — Ambulatory Visit (INDEPENDENT_AMBULATORY_CARE_PROVIDER_SITE_OTHER): Payer: Medicare Other | Admitting: Cardiovascular Disease

## 2015-04-28 VITALS — BP 146/74 | HR 96 | Ht 64.0 in | Wt 211.0 lb

## 2015-04-28 DIAGNOSIS — E1165 Type 2 diabetes mellitus with hyperglycemia: Secondary | ICD-10-CM

## 2015-04-28 DIAGNOSIS — I10 Essential (primary) hypertension: Secondary | ICD-10-CM

## 2015-04-28 DIAGNOSIS — E785 Hyperlipidemia, unspecified: Secondary | ICD-10-CM | POA: Diagnosis not present

## 2015-04-28 DIAGNOSIS — R269 Unspecified abnormalities of gait and mobility: Secondary | ICD-10-CM | POA: Diagnosis not present

## 2015-04-28 DIAGNOSIS — E1141 Type 2 diabetes mellitus with diabetic mononeuropathy: Secondary | ICD-10-CM | POA: Diagnosis not present

## 2015-04-28 DIAGNOSIS — E119 Type 2 diabetes mellitus without complications: Secondary | ICD-10-CM | POA: Diagnosis not present

## 2015-04-28 DIAGNOSIS — IMO0002 Reserved for concepts with insufficient information to code with codable children: Secondary | ICD-10-CM

## 2015-04-28 DIAGNOSIS — M6281 Muscle weakness (generalized): Secondary | ICD-10-CM | POA: Diagnosis not present

## 2015-04-28 DIAGNOSIS — Z79899 Other long term (current) drug therapy: Secondary | ICD-10-CM | POA: Diagnosis not present

## 2015-04-28 DIAGNOSIS — E1149 Type 2 diabetes mellitus with other diabetic neurological complication: Secondary | ICD-10-CM

## 2015-04-28 DIAGNOSIS — I213 ST elevation (STEMI) myocardial infarction of unspecified site: Secondary | ICD-10-CM | POA: Diagnosis not present

## 2015-04-28 DIAGNOSIS — E669 Obesity, unspecified: Secondary | ICD-10-CM

## 2015-04-28 MED ORDER — METOPROLOL TARTRATE 100 MG PO TABS: 100.0000 mg | ORAL_TABLET | Freq: Two times a day (BID) | ORAL | Status: DC

## 2015-04-28 MED ORDER — ROSUVASTATIN CALCIUM 20 MG PO TABS: 20.0000 mg | ORAL_TABLET | Freq: Every day | ORAL | Status: DC

## 2015-04-28 NOTE — Patient Instructions (Signed)
Your physician has recommended you make the following change in your medication: increase the lopressor to 100 mg twice a day. New prescription has been sent to your pharmacy.  Your physician recommends that you return for lab work in: 2 months.  Your physician recommends that you schedule a follow-up appointment in: 3 months with Dr. [name].

## 2015-04-29 ENCOUNTER — Ambulatory Visit (HOSPITAL_BASED_OUTPATIENT_CLINIC_OR_DEPARTMENT_OTHER): Payer: Medicare Other | Admitting: Hematology and Oncology

## 2015-04-29 ENCOUNTER — Telehealth: Payer: Self-pay | Admitting: Hematology and Oncology

## 2015-04-29 ENCOUNTER — Encounter: Payer: Self-pay | Admitting: Hematology and Oncology

## 2015-04-29 VITALS — BP 134/55 | HR 89 | Temp 98.6°F | Resp 18 | Ht 64.0 in | Wt 212.1 lb

## 2015-04-29 DIAGNOSIS — D45 Polycythemia vera: Secondary | ICD-10-CM | POA: Diagnosis not present

## 2015-04-29 DIAGNOSIS — D473 Essential (hemorrhagic) thrombocythemia: Secondary | ICD-10-CM | POA: Diagnosis not present

## 2015-04-29 DIAGNOSIS — D75839 Thrombocytosis, unspecified: Secondary | ICD-10-CM

## 2015-04-29 DIAGNOSIS — D72829 Elevated white blood cell count, unspecified: Secondary | ICD-10-CM

## 2015-04-29 MED ORDER — HYDROXYUREA 500 MG PO CAPS: 1500.0000 mg | ORAL_CAPSULE | Freq: Every day | ORAL | Status: DC

## 2015-04-29 NOTE — Assessment & Plan Note (Signed)
She has missed many days of hydroxyurea because of recent heart attack. Since then, she has been very compliant with taking her medications. Now that her body is healing from recent heart attack, it does not surprise me that her polycythemia is a little out of control. I will increase hydroxyurea to 1500 mg daily and recheck in 1 month

## 2015-04-29 NOTE — Telephone Encounter (Signed)
Pt confirmed labs/ov per 07/12 POF, gave pt AVS and Calendar... KJ °

## 2015-04-29 NOTE — Progress Notes (Signed)
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 The patient had bone marrow aspirate and biopsy performed in [place] and confirmed myeloproliferative disorder. We do not have records of those results. She had been treated with phlebotomy, hydroxyurea and aspirin.  The patient also have liver cirrhosis with splenomegaly, on observation On 07/04/2014, hydroxyurea was increased to 2 tablets daily except on Saturdays and Sundays she take 3 tablets On 09/30/2014, I increase hydroxyurea to 3 tablets daily In April 2016, she had heart attack with stent placement. She was taken off hydroxyurea On 02/27/15: Hydrea resumed at 500 mg daily On 03/31/2015, hydroxyurea dose was increased to 1000 mg daily On 04/29/2015, hydroxyurea dose was increased to 1500 mg daily  INTERVAL HISTORY: Please see below for problem oriented charting. She feels well. Denies any chest pain or shortness of breath. She had mild bruises from antiplatelets agents. The patient denies any recent signs or symptoms of bleeding such as spontaneous epistaxis, hematuria or hematochezia.   REVIEW OF SYSTEMS:   Constitutional: Denies fevers, chills or abnormal weight loss Eyes: Denies blurriness of vision Ears, nose, mouth, throat, and face: Denies mucositis or sore throat Respiratory: Denies cough, dyspnea or wheezes Cardiovascular: Denies palpitation, chest discomfort or lower extremity swelling Gastrointestinal:  Denies nausea, heartburn or change in  bowel habits Skin: Denies abnormal skin rashes Lymphatics: Denies new lymphadenopathy  Neurological:Denies numbness, tingling or new weaknesses Behavioral/Psych: Mood is stable, no new changes  All other systems were reviewed with the patient and are negative.  I have reviewed the past medical history, past surgical history, social history and family history with the patient and they are unchanged from previous note.  ALLERGIES:  is allergic to lisinopril; tape; doxycycline; and latex.  MEDICATIONS:  Current Outpatient Prescriptions  Medication Sig Dispense Refill  . acetaminophen-codeine (TYLENOL #3) 300-30 MG per tablet Take one or two tablets every 6 hours prn pain 30 tablet 0  . ALPRAZolam (XANAX) 0.25 MG tablet Take 1 tablet (0.25 mg total) by mouth 3 (three) times daily as needed for anxiety. 20 tablet 0  . amLODipine (NORVASC) 10 MG tablet Take 1 tablet (10 mg total) by mouth daily. 30 tablet 11  . aspirin EC 81 MG tablet Take 81 mg by mouth daily.    . B-D INS SYR ULTRAFINE 1CC/31G 31G X 5/16" 1 ML MISC     . diphenhydrAMINE (BENADRYL) 25 mg capsule Take 25 mg by mouth every 6 (six) hours as needed.    . fluticasone (FLONASE) 50 MCG/ACT nasal spray Place 2 sprays into both nostrils daily. 16 g 6  . gabapentin (NEURONTIN) 100 MG capsule Take up to 6 tablets 3x a day as needed for pain 90 capsule 2  . glucose blood (BAYER CONTOUR NEXT TEST) test strip Use to test blood sugar 3 times daily as instructed. Dx code: 250.62 100 each 11  . hydrochlorothiazide (MICROZIDE) 12.5 MG capsule Take 1 capsule (12.5 mg total) by mouth daily. 90 capsule 3  . hydrocortisone 2.5 % cream Apply 1 application topically 2 (two) times daily.    Marland Kitchen  hydroxyurea (HYDREA) 500 MG capsule Take 3 capsules (1,500 mg total) by mouth daily. May take with food to minimize GI side effects. 90 capsule 0  . insulin regular human CONCENTRATED (HUMULIN R) 500 UNIT/ML SOLN injection Inject under skin 66 units before  breakfast and dinner 20 mL 2  . ketoconazole (NIZORAL) 2 % cream Apply topically daily. Apply to rash daily for 10 days 15 g 1  . loperamide (IMODIUM A-D) 2 MG tablet Take 2 mg by mouth daily.    Marland Kitchen losartan (COZAAR) 100 MG tablet Take 1 tablet (100 mg total) by mouth daily. 90 tablet 3  . metoprolol (LOPRESSOR) 100 MG tablet Take 1 tablet (100 mg total) by mouth 2 (two) times daily. 180 tablet 3  . nitroGLYCERIN (NITROSTAT) 0.4 MG SL tablet Place 1 tablet (0.4 mg total) under the tongue every 5 (five) minutes as needed for chest pain. 25 tablet 3  . NONFORMULARY OR COMPOUNDED ITEM Neuropathy cream: mix Amitriptyline 2%, Lidocaine 2%, and Ketamine 1% - apply on feet 2x a day [facility]. 1 each 2  . nystatin cream (MYCOSTATIN) Apply 1 application topically 2 (two) times daily.    Marland Kitchen omega-3 acid ethyl esters (LOVAZA) 1 G capsule Take 1 capsule (1 g total) by mouth 2 (two) times daily. 60 capsule 11  . ondansetron (ZOFRAN) 8 MG tablet Take 8 mg by mouth every 8 (eight) hours as needed for nausea or vomiting.    Marland Kitchen oxyCODONE-acetaminophen (PERCOCET/ROXICET) 5-325 MG per tablet Take 1 tablet by mouth every 4 (four) hours as needed for moderate pain. 20 tablet 0  . Probiotic Product (PROBIOTIC DAILY PO) Take by mouth daily.    . ranitidine (ZANTAC) 300 MG tablet Take 1 tablet (300 mg total) by mouth at bedtime. 30 tablet 5  . rosuvastatin (CRESTOR) 20 MG tablet Take 1 tablet (20 mg total) by mouth daily. 90 tablet 3  . ticagrelor (BRILINTA) 90 MG TABS tablet Take 1 tablet (90 mg total) by mouth 2 (two) times daily. 60 tablet 5   No current facility-administered medications for this visit.    PHYSICAL EXAMINATION: ECOG PERFORMANCE STATUS: 0 - Asymptomatic  Filed Vitals:   04/29/15 0819  BP: 134/55  Pulse: 89  Temp: 98.6 F (37 C)  Resp: 18   Filed Weights   04/29/15 0819  Weight: 212 lb 1.6 oz (96.208 kg)    GENERAL:alert, no distress and comfortable SKIN: skin color, texture,  turgor are normal, no rashes or significant lesions EYES: normal, Conjunctiva are pink and non-injected, sclera clear Musculoskeletal:no cyanosis of digits and no clubbing  NEURO: alert & oriented x 3 with fluent speech, no focal motor/sensory deficits  LABORATORY DATA:  I have reviewed the data as listed    Component Value Date/Time   NA 144 02/04/2015 0411   NA 133* 12/10/2013 1239   K 3.8 02/04/2015 0411   K 4.0 12/10/2013 1239   CL 104 02/04/2015 0411   CL 103 02/16/2013 0900   CO2 30 02/04/2015 0411   CO2 21* 12/10/2013 1239   GLUCOSE 109* 02/04/2015 0411   GLUCOSE 635* 12/10/2013 1239   GLUCOSE 396* 02/16/2013 0900   GLUCOSE 292 06/02/2010   BUN 37* 02/04/2015 0411   BUN 7.9 12/10/2013 1239   CREATININE 1.07 02/04/2015 0411   CREATININE 1.2* 12/10/2013 1239   CALCIUM 8.9 02/04/2015 0411   CALCIUM 9.5 12/10/2013 1239   PROT 6.2 01/27/2015 1320   PROT 6.7 12/10/2013 1239   ALBUMIN 3.5 01/27/2015  1320   ALBUMIN 3.7 12/10/2013 1239   AST 68* 01/27/2015 1320   AST 27 12/10/2013 1239   ALT 18 01/27/2015 1320   ALT 18 12/10/2013 1239   ALKPHOS 80 01/27/2015 1320   ALKPHOS 108 12/10/2013 1239   BILITOT 1.2 01/27/2015 1320   BILITOT 0.83 12/10/2013 1239   GFRNONAA 49* 02/04/2015 0411   GFRAA 57* 02/04/2015 0411    No results found for: SPEP, UPEP  Lab Results  Component Value Date   WBC 18.7* 04/24/2015   NEUTROABS 14.7* 04/24/2015   HGB 12.3 04/24/2015   HCT 37.0 04/24/2015   MCV 98.4 04/24/2015   PLT 826* 04/24/2015      Chemistry      Component Value Date/Time   NA 144 02/04/2015 0411   NA 133* 12/10/2013 1239   K 3.8 02/04/2015 0411   K 4.0 12/10/2013 1239   CL 104 02/04/2015 0411   CL 103 02/16/2013 0900   CO2 30 02/04/2015 0411   CO2 21* 12/10/2013 1239   BUN 37* 02/04/2015 0411   BUN 7.9 12/10/2013 1239   CREATININE 1.07 02/04/2015 0411   CREATININE 1.2* 12/10/2013 1239      Component Value Date/Time   CALCIUM 8.9 02/04/2015 0411   CALCIUM  9.5 12/10/2013 1239   ALKPHOS 80 01/27/2015 1320   ALKPHOS 108 12/10/2013 1239   AST 68* 01/27/2015 1320   AST 27 12/10/2013 1239   ALT 18 01/27/2015 1320   ALT 18 12/10/2013 1239   BILITOT 1.2 01/27/2015 1320   BILITOT 0.83 12/10/2013 1239      ASSESSMENT & PLAN:  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 Since then, she has been very compliant with taking her medications. Now that her body is healing from recent heart attack, it does not surprise me that her polycythemia is a little out of control. I will increase hydroxyurea to 1500 mg daily and recheck in 1 month   Leukocytosis This is related to the spectrum of her myeloproliferative disorder. I am increasing the dose of hydroxyurea as above.   Thrombocytosis This is related to myeloproliferative disorder. I will increase her hydroxyurea dose as above.     No orders of the defined types were placed in this encounter.   All questions were answered. The patient knows to call the clinic with any problems, questions or concerns. No barriers to learning was detected. I spent 15 minutes counseling the patient face to face. The total time spent in the appointment was 20 minutes and more than 50% was on counseling and review of test results     [facility], [place], MD 04/29/2015 8:46 AM

## 2015-04-29 NOTE — Assessment & Plan Note (Signed)
This is related to the spectrum of her myeloproliferative disorder. I am increasing the dose of hydroxyurea as above.

## 2015-04-29 NOTE — Assessment & Plan Note (Signed)
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 I will increase her hydroxyurea dose as above.

## 2015-04-30 ENCOUNTER — Encounter: Payer: Self-pay | Admitting: Cardiovascular Disease

## 2015-04-30 ENCOUNTER — Ambulatory Visit (HOSPITAL_COMMUNITY): Payer: Medicare Other

## 2015-04-30 DIAGNOSIS — E119 Type 2 diabetes mellitus without complications: Secondary | ICD-10-CM | POA: Diagnosis not present

## 2015-04-30 DIAGNOSIS — I10 Essential (primary) hypertension: Secondary | ICD-10-CM | POA: Diagnosis not present

## 2015-04-30 DIAGNOSIS — R269 Unspecified abnormalities of gait and mobility: Secondary | ICD-10-CM | POA: Diagnosis not present

## 2015-04-30 DIAGNOSIS — E669 Obesity, unspecified: Secondary | ICD-10-CM | POA: Insufficient documentation

## 2015-04-30 DIAGNOSIS — I213 ST elevation (STEMI) myocardial infarction of unspecified site: Secondary | ICD-10-CM | POA: Diagnosis not present

## 2015-04-30 DIAGNOSIS — M6281 Muscle weakness (generalized): Secondary | ICD-10-CM | POA: Diagnosis not present

## 2015-04-30 NOTE — Progress Notes (Signed)
Patient ID: Amanda [name], female   DOB: [birth year], 74 y.o.   MRN: [id number]     HPI: Amanda [name] is a 74 y.o. female who presents to the office today for  Follow-up evaluation following her recent hospitalization in April 2016, after presenting with an ST segment elevation MI.   Amanda. [name] has a history of hypertension, type 2 diabetes mellitus, polycythemia, as well as peripheral neuropathy.  She developed new onset chest pain on 01/27/2015.  She was found have 3 mm ST segment elevation by EMS.  During transport to the hospital.  She developed idioventricular rhythm which was treated with amiodarone bolus.  I took her acutely to the cardiac catheterization laboratory on 01/27/2015.  She was found to have  Dominant single-vessel CAD and had 95% ulcerated plaque in the mid AV groove circumflex with distal 80% circumflex stenosis, 80% stenosis a small OM1 vessel, and she had normal LAD and mild 20% irregularity in the RCA. She'll 1.  Successful PCI intervention with these insertion of a science Alpine 3.023 mm DES stent postdilated to 3.15 millimeters in the mid lesion in the distal stenosis was treated with a 2.518 mm Xience Alpine DES stent postdilated to 2.57 mm.  It was felt that her idioventricular rhythm was probably the result of spontaneous reperfusion and root to the hospital. Charge she went 3 days to [place] form rehabilitation.  She felt this was not helpful.  Ultimately left after 3 days. She admits to being very anxious. Home health has been coming to the house.   In the hospital, an echo Doppler study revealed an EF of 50-55%.  She had abnormal tissue Doppler.  There is mitral annular calcification and moderate LA dilatation.  She had abnormal posterior lateral and septal strain.   she has had difficulty with diarrhea. She admits to poor sleep. She admits to being very anxious. She is unaware of recurrent chest pressure.  Past Medical History  Diagnosis Date  . Hypertension   .  Hyperlipidemia   . Diabetes mellitus 2008  . Depression   . Obesity   . Psoriasis   . Polycythemia     Dr. Elease Hashimoto- [facility]  . Herpes simplex   . Gout   . Hyperglycemia 05/31/2013  . C. difficile colitis   . Cirrhosis   . Cancer     SKIN  . Neuromuscular disorder     BELL PALSY  . Candidiasis of skin 09/30/2014  . Myocardial infarction     Past Surgical History  Procedure Laterality Date  . Breast surgery Left     milk duct  . Total abdominal hysterectomy w/ bilateral salpingoophorectomy      for heavy periods with appendectomy  . Tonsillectomy    . Colonoscopy    . Left heart catheterization with coronary angiogram N/A 01/27/2015    Procedure: LEFT HEART CATHETERIZATION WITH CORONARY ANGIOGRAM;  Surgeon: [name], MD;  Location: [facility] CATH LAB;  Service: Cardiovascular;  Laterality: N/A;    Allergies  Allergen Reactions  . Lisinopril Cough  . Tape Hives  . Doxycycline Hives, Swelling and Rash  . Latex Hives, Itching and Rash    Current Outpatient Prescriptions  Medication Sig Dispense Refill  . acetaminophen-codeine (TYLENOL #3) 300-30 MG per tablet Take one or two tablets every 6 hours prn pain 30 tablet 0  . ALPRAZolam (XANAX) 0.25 MG tablet Take 1 tablet (0.25 mg total) by mouth 3 (three) times daily as needed for anxiety. 20 tablet  0  . amLODipine (NORVASC) 10 MG tablet Take 1 tablet (10 mg total) by mouth daily. 30 tablet 11  . aspirin EC 81 MG tablet Take 81 mg by mouth daily.    . B-D INS SYR ULTRAFINE 1CC/31G 31G X 5/16" 1 ML MISC     . diphenhydrAMINE (BENADRYL) 25 mg capsule Take 25 mg by mouth every 6 (six) hours as needed.    . fluticasone (FLONASE) 50 MCG/ACT nasal spray Place 2 sprays into both nostrils daily. 16 g 6  . gabapentin (NEURONTIN) 100 MG capsule Take up to 6 tablets 3x a day as needed for pain 90 capsule 2  . glucose blood (BAYER CONTOUR NEXT TEST) test strip Use to test blood sugar 3 times daily as instructed. Dx code: 250.62 100  each 11  . hydrochlorothiazide (MICROZIDE) 12.5 MG capsule Take 1 capsule (12.5 mg total) by mouth daily. 90 capsule 3  . hydrocortisone 2.5 % cream Apply 1 application topically 2 (two) times daily.    . insulin regular human CONCENTRATED (HUMULIN R) 500 UNIT/ML SOLN injection Inject under skin 66 units before breakfast and dinner 20 mL 2  . ketoconazole (NIZORAL) 2 % cream Apply topically daily. Apply to rash daily for 10 days 15 g 1  . loperamide (IMODIUM A-D) 2 MG tablet Take 2 mg by mouth daily.    Marland Kitchen losartan (COZAAR) 100 MG tablet Take 1 tablet (100 mg total) by mouth daily. 90 tablet 3  . nitroGLYCERIN (NITROSTAT) 0.4 MG SL tablet Place 1 tablet (0.4 mg total) under the tongue every 5 (five) minutes as needed for chest pain. 25 tablet 3  . NONFORMULARY OR COMPOUNDED ITEM Neuropathy cream: mix Amitriptyline 2%, Lidocaine 2%, and Ketamine 1% - apply on feet 2x a day [facility]. 1 each 2  . nystatin cream (MYCOSTATIN) Apply 1 application topically 2 (two) times daily.    Marland Kitchen omega-3 acid ethyl esters (LOVAZA) 1 G capsule Take 1 capsule (1 g total) by mouth 2 (two) times daily. 60 capsule 11  . ondansetron (ZOFRAN) 8 MG tablet Take 8 mg by mouth every 8 (eight) hours as needed for nausea or vomiting.    Marland Kitchen oxyCODONE-acetaminophen (PERCOCET/ROXICET) 5-325 MG per tablet Take 1 tablet by mouth every 4 (four) hours as needed for moderate pain. 20 tablet 0  . Probiotic Product (PROBIOTIC DAILY PO) Take by mouth daily.    . ranitidine (ZANTAC) 300 MG tablet Take 1 tablet (300 mg total) by mouth at bedtime. 30 tablet 5  . ticagrelor (BRILINTA) 90 MG TABS tablet Take 1 tablet (90 mg total) by mouth 2 (two) times daily. 60 tablet 5  . hydroxyurea (HYDREA) 500 MG capsule Take 3 capsules (1,500 mg total) by mouth daily. May take with food to minimize GI side effects. 90 capsule 0  . metoprolol (LOPRESSOR) 100 MG tablet Take 1 tablet (100 mg total) by mouth 2 (two) times daily. 180 tablet 3  .  rosuvastatin (CRESTOR) 20 MG tablet Take 1 tablet (20 mg total) by mouth daily. 90 tablet 3   No current facility-administered medications for this visit.    History   Social History  . Marital Status: Married    Spouse Name: N/A  . Number of Children: 2  . Years of Education: N/A   Occupational History  . housewife    Social History Main Topics  . Smoking status: Former Smoker -- 48 years    Types: Cigarettes    Quit date: 10/18/1986  .  Smokeless tobacco: Never Used  . Alcohol Use: No  . Drug Use: No  . Sexual Activity: Yes    Birth Control/ Protection: None   Other Topics Concern  . Not on file   Social History Narrative    Family History  Problem Relation Age of Onset  . Diabetes Mother   . Heart disease Mother     CAD  . Hyperlipidemia Mother   . Hypertension Mother   . Kidney disease Mother   . Kidney disease Father   . Breast cancer Maternal Aunt   . Heart disease Maternal Grandmother   . Heart disease Other     maternal aunts and uncles    ROS General: Negative; No fevers, chills, or night sweats HEENT: Negative; No changes in vision or hearing, sinus congestion, difficulty swallowing Pulmonary: Negative; No cough, wheezing, shortness of breath, hemoptysis Cardiovascular: See HPI: No chest pain, presyncope, syncope, palpatations GI: Negative; No nausea, vomiting, diarrhea, or abdominal pain GU: Negative; No dysuria, hematuria, or difficulty voiding Musculoskeletal: Negative; no myalgias, joint pain, or weakness Hematologic: Negative; no easy bruising, bleeding Endocrine: Negative; no heat/cold intolerance; no diabetes, Neuro: Negative; no changes in balance, headaches Skin: Negative; No rashes or skin lesions Psychiatric: Negative; No behavioral problems, depression Sleep: Negative; No snoring,  daytime sleepiness, hypersomnolence, bruxism, restless legs, hypnogognic hallucinations. Other comprehensive 14 point system review is  negative   Physical Exam BP 146/74 mmHg  Pulse 96  Ht 5' 4" (1.626 m)  Wt 211 lb (95.709 kg)  BMI 36.20 kg/m2 Wt Readings from Last 3 Encounters:  04/29/15 212 lb 1.6 oz (96.208 kg)  04/28/15 211 lb (95.709 kg)  04/08/15 216 lb (97.977 kg)   General: Alert, oriented, no distress.  Skin: normal turgor, no rashes, warm and dry HEENT: Normocephalic, atraumatic. Pupils equal round and reactive to light; sclera anicteric; extraocular muscles intact, No lid lag; Nose without nasal septal hypertrophy; Mouth/Parynx benign; Mallinpatti scale 3 Neck: No JVD, no carotid bruits; normal carotid upstroke Lungs: clear to ausculatation and percussion bilaterally; no wheezing or rales, normal inspiratory and expiratory effort Chest wall: without tenderness to palpitation Heart: PMI not displaced, RRR, s1 s2 normal, 1/6 systolic murmur, No diastolic murmur, no rubs, gallops, thrills, or heaves Abdomen: soft, nontender; no hepatosplenomehaly, BS+; abdominal aorta nontender and not dilated by palpation. Back: no CVA tenderness Pulses: 2+  Musculoskeletal: full range of motion, normal strength, no joint deformities Extremities: Pulses 2+, no clubbing cyanosis or edema, Homan's sign negative  Neurologic: grossly nonfocal; Cranial nerves grossly wnl Psychologic: Normal mood and affect   ECG (independently read by me):  Normal sinus rhythm at 96 bpm. No significant ST changes.  No ECG evidence of prior MI.  LABS:  BMP Latest Ref Rng 02/04/2015 02/03/2015 02/01/2015  Glucose 70 - 99 mg/dL 109(H) 52(L) 74  BUN 6 - 23 mg/dL 37(H) 42(H) 54(H)  Creatinine 0.50 - 1.10 mg/dL 1.07 1.10 1.32(H)  Sodium 135 - 145 mmol/L 144 144 145  Potassium 3.5 - 5.1 mmol/L 3.8 4.4 4.4  Chloride 96 - 112 mmol/L 104 106 110  CO2 19 - 32 mmol/L _0 Calcium 8.4 - 10.5 mg/dL 8.9 8.7 8.4     Hepatic Function Latest Ref Rng 01/27/2015 03/29/2014 02/01/2014  Total Protein 6.0 - 8.3 g/dL 6.2 7.1 6.3  Albumin 3.5 - 5.2 g/dL  3.5 4.0 3.2(L)  AST 0 - 37 U/L 68(H) 30 28  ALT 0 - 35 U/L _1 Alk Phosphatase  39 - 117 U/L 80 91 98  Total Bilirubin 0.3 - 1.2 mg/dL 1.2 0.7 0.6    CBC Latest Ref Rng 04/24/2015 03/31/2015 02/27/2015  WBC 3.9 - 10.3 10e3/uL 18.7(H) 13.1(H) 10.5(H)  Hemoglobin 11.6 - 15.9 g/dL 12.3 11.6 13.1  Hematocrit 34.8 - 46.6 % 37.0 34.8 38.5  Platelets 145 - 400 10e3/uL 826(H) 723(H) 470(H)   Lab Results  Component Value Date   MCV 98.4 04/24/2015   MCV 99.5 03/31/2015   MCV 102.4* 02/27/2015    Lab Results  Component Value Date   TSH 3.290 01/29/2014    BNP No results found for: BNP  ProBNP    Component Value Date/Time   PROBNP 311.6* 01/29/2014 1203     Lipid Panel     Component Value Date/Time   CHOL 168 01/27/2015 1320   TRIG 266* 01/27/2015 1320   HDL 27* 01/27/2015 1320   CHOLHDL 6.2 01/27/2015 1320   VLDL 53* 01/27/2015 1320   LDLCALC 88 01/27/2015 1320     RADIOLOGY: No results found.    ASSESSMENT AND PLAN: Amanda [name] is a 74 year old female who is originally from [place] who presented on 01/27/2015 with a left circumflex STEMI.  She most likely had some spontaneous reperfusion.  Transient from home leading to her idioventricular rhythm.  She underwent successful intervention.  Her echo Doppler study.  2 days later showed an EF of 50-55% with fairly normal wall motion.  She is not well beta blocked.  Presently with a resting heart rate of 96.  I'm increasing her Lopressor from 75 mg twice a day to 100 mg twice a day for optimal beta-blockade.  Her blood pressure today is mildly elevated and this will be helpful as well.  She will continue her current dose of losartan 100 mg and amlodipine 10 mg , which is also treating her blood pressure.  She is on dual platelet therapy with aspirin and Brilinta and denies bleeding.  She has been on atorvastatin for a short while after hospitalization, but she stopped taking this secondary to nausea.  I reviewed  laboratory from April.  I will now start her on Crestor 20 mg for aggressivelipid-lowering therapy. She is diabetic on insulin.  She has peripheral neuropathy on gabapentin.  Repeat laboratory will be obtained in approximately 2 months and I will see her for follow-up office visit in 3 months.   time spent: 30 minutes   [name], MD, [facility]  04/30/2015 1:45 PM

## 2015-05-02 ENCOUNTER — Ambulatory Visit (HOSPITAL_COMMUNITY): Payer: Medicare Other

## 2015-05-02 ENCOUNTER — Telehealth: Payer: Self-pay | Admitting: *Deleted

## 2015-05-02 DIAGNOSIS — E119 Type 2 diabetes mellitus without complications: Secondary | ICD-10-CM | POA: Diagnosis not present

## 2015-05-02 DIAGNOSIS — I213 ST elevation (STEMI) myocardial infarction of unspecified site: Secondary | ICD-10-CM | POA: Diagnosis not present

## 2015-05-02 DIAGNOSIS — M6281 Muscle weakness (generalized): Secondary | ICD-10-CM | POA: Diagnosis not present

## 2015-05-02 DIAGNOSIS — I10 Essential (primary) hypertension: Secondary | ICD-10-CM | POA: Diagnosis not present

## 2015-05-02 DIAGNOSIS — R269 Unspecified abnormalities of gait and mobility: Secondary | ICD-10-CM | POA: Diagnosis not present

## 2015-05-02 NOTE — Telephone Encounter (Signed)
[name] called me - requesting Rx for Crestor to be sent to [facility] for coverage of [place] Rx discount program (Pt already enrolled successfully for Brilinta).  Verified fax number w/ [facility] phone rep, faxed Rx, informed caller who acknowledged.

## 2015-05-05 ENCOUNTER — Ambulatory Visit (HOSPITAL_COMMUNITY): Payer: Medicare Other

## 2015-05-05 DIAGNOSIS — M6281 Muscle weakness (generalized): Secondary | ICD-10-CM | POA: Diagnosis not present

## 2015-05-05 DIAGNOSIS — E119 Type 2 diabetes mellitus without complications: Secondary | ICD-10-CM | POA: Diagnosis not present

## 2015-05-05 DIAGNOSIS — I10 Essential (primary) hypertension: Secondary | ICD-10-CM | POA: Diagnosis not present

## 2015-05-05 DIAGNOSIS — R269 Unspecified abnormalities of gait and mobility: Secondary | ICD-10-CM | POA: Diagnosis not present

## 2015-05-05 DIAGNOSIS — I213 ST elevation (STEMI) myocardial infarction of unspecified site: Secondary | ICD-10-CM | POA: Diagnosis not present

## 2015-05-07 ENCOUNTER — Encounter: Payer: Self-pay | Admitting: Internal Medicine

## 2015-05-07 ENCOUNTER — Ambulatory Visit (INDEPENDENT_AMBULATORY_CARE_PROVIDER_SITE_OTHER): Payer: Medicare Other | Admitting: Internal Medicine

## 2015-05-07 ENCOUNTER — Ambulatory Visit (HOSPITAL_COMMUNITY): Payer: Medicare Other

## 2015-05-07 ENCOUNTER — Ambulatory Visit: Payer: Medicare Other | Admitting: Internal Medicine

## 2015-05-07 VITALS — BP 138/74 | HR 100 | Temp 98.3°F | Resp 12 | Wt 211.4 lb

## 2015-05-07 DIAGNOSIS — E1165 Type 2 diabetes mellitus with hyperglycemia: Principal | ICD-10-CM

## 2015-05-07 DIAGNOSIS — E1141 Type 2 diabetes mellitus with diabetic mononeuropathy: Secondary | ICD-10-CM

## 2015-05-07 DIAGNOSIS — E1149 Type 2 diabetes mellitus with other diabetic neurological complication: Secondary | ICD-10-CM

## 2015-05-07 DIAGNOSIS — IMO0002 Reserved for concepts with insufficient information to code with codable children: Secondary | ICD-10-CM

## 2015-05-07 MED ORDER — INSULIN REGULAR HUMAN (CONC) 500 UNIT/ML ~~LOC~~ SOPN: 75.0000 [IU] | PEN_INJECTOR | Freq: Two times a day (BID) | SUBCUTANEOUS | Status: DC

## 2015-05-07 NOTE — Progress Notes (Signed)
Patient ID: Amanda [name], female   DOB: 30-Jan-1941, 74 y.o.   MRN: [id number]  HPI: CONSTANTINA LASETER is a 74 y.o.-year-old female, returning for f/u for DM2, dx 2008, insulin-dependent since dx, uncontrolled, with complications (DR, PN, CAD). Last visit 2 mo ago.  She has coughing and vomiting 2/2 ACEI >> started ARB >> cough and vomiting stopped.  Last hemoglobin A1c was: 01/27/2015: HbA1c from fructosamine: 6.57% 05/16/2014: HbA1c from fructosamine: 7.95%. Lab Results  Component Value Date   HGBA1C 8.8* 01/27/2015   HGBA1C 9.8* 05/16/2014   HGBA1C 13.5* 01/31/2014  - in 10/16/2012, hemoglobin A1c was 6.9% - in 07/2012, hemoglobin A1c was 9.9% - in 11/16/2010, hemoglobin A1c was 12.8% - in 03/2011, hemoglobin A1c was 6.9% - In 07/10/2010 her hemoglobin A1c was 10.5% C-peptide was 6.3 and 01/2011.  Pt was on a regimen of: - Levemir 45 units 2x a day, in am and at bedtime - Humalog mealtime: 40 units with a smaller meal 45 units with a larger meal  - Sliding Scale of Humalog:  - 150- 165: + 1 unit  - 166- 180: + 2 units  - 181- 195: + 3 units  - 196- 210: + 4 units  - 211- 225: + 5 units  - 226- 240: + 6 units  - >240: + 7 units  She could not tolerate doses of metformin XR higher than 250 mg twice a day in the past due to diarrhea. She does not want to resume this. Was previously on the Victoza 1.8 mg daily - "made me sick". She was previously on Janumet between 2012-2013. She was on Invokana - but could not afford it while in the Medicare doughnut hole.  We switched to U500: - 15 units - on the syringe (0.15 mL) before breakfast >> 20 units >> 15 units  - 18 units - on the syringe (0.18 mL) before dinner >> 20 units >> 15 units  Pt is not checking sugars at home only few checks - in the 200's. We reviewed the sugars from last time: - am: 190-255 >> 209-347 >> same >> 90-211, 235  - 2h after b'fast: n/c >> 238 - before lunch: 140, 223, 247 >> 161-319 >> 258-290 >> 167-343  >> 175-340 >> 238 - 2h after lunch: 240-312 >> n/c >> 270-456 >> 112-218 - before dinner: 150-330 >> 150-170 >> 90 x1, 315 >> 175-336 >> 147, 273 - bedtime: 152-306 >> n/c >> 161-326 >> 164, 240, 240, 268 >> 273 No lows. Lowest sugar was 90x1 >> 70x1; she has hypoglycemia awareness at 100.   Pt's meals are - reviewed per last visit: - Breakfast: ("I am not a breakfast eater") 1 egg + toast; 3 pancakes + sugarfree syrup; waffle; french toast - not skipping anymore  - Lunch: sometimes skips, sometimes eats out: pizza, salad, no dessert - Dinner: at 5:30-6pm: soup, meat + vegetables + starch, dessert - Snacks: 8 pm: fruit, sugar free  She was seen for nutritional advice and diabetes education, with last visit on 04/2012 - [name].  - no CKD, last BUN/creatinine:  Lab Results  Component Value Date   BUN 37* 02/04/2015   CREATININE 1.07 02/04/2015  She is on ACEI.  - last set of lipids: Lab Results  Component Value Date   CHOL 168 01/27/2015   HDL 27* 01/27/2015   LDLCALC 88 01/27/2015   TRIG 266* 01/27/2015   CHOLHDL 6.2 01/27/2015  She is on Pravastatin. - last eye exam  was in 07/2013. 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 At last eye exam (Dr Zigmund Daniel): cataracts. - no numbness and tingling in her feet. For her peripheral neuropathy >> saw PCP, podiatrist, orthopedic >> tried capsaicin cream, compression stockings, then Neurontin >> on 600 mg 3x a day. She is on aspirin 81 mg daily.  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  On 01/27/2015 she had an AMI >> had 2 stents placed. She had very erratic sugars in the hospital, not checking at home!   ROS: Constitutional: no weight gain/loss, no fatigue Eyes: no blurry vision, no xerophthalmia ENT: no sore throat, no nodules palpated in  throat, no dysphagia/odynophagia, no hoarseness Cardiovascular: no CP/SOB/palpitations/ + leg swelling Respiratory:no  cough/no SOB Gastrointestinal: + N/+ V/no D/C Musculoskeletal: no muscle/joint aches Skin: No rashes Neurological: no tremors/numbness/tingling/dizziness  I reviewed pt's medications, allergies, PMH, social hx, family hx, and changes were documented in the history of present illness. Otherwise, unchanged from my initial visit note. Hydroxyurea dose has been increased to 3 tabs a day.  PE: BP 138/74 mmHg  Pulse 100  Temp(Src) 98.3 F (36.8 C) (Oral)  Resp 12  Wt 211 lb 6.4 oz (95.89 kg)  SpO2 95% Body mass index is 36.27 kg/(m^2). Wt Readings from Last 3 Encounters:  05/07/15 211 lb 6.4 oz (95.89 kg)  04/29/15 212 lb 1.6 oz (96.208 kg)  04/28/15 211 lb (95.709 kg)   Constitutional: overweight, in NAD Eyes: PERRLA, EOMI, no exophthalmos ENT: moist mucous membranes, no thyromegaly, no cervical lymphadenopathy Cardiovascular: tachycardia RR, No MRG, no leg swelling Respiratory: CTA B Gastrointestinal: abdomen soft, NT, ND, BS+ Musculoskeletal: no deformities, strength intact in all 4 Skin: moist, warm  ASSESSMENT: 1. 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 Diabetic peripheral neuropathy  PLAN:  1. Patient with long-standing, uncontrolled diabetes, doing better on U500, but she again does not bring any sugar checks today. I advised her to start checking and writing them down and bring them at next visit. This is imperative before I can make changes in her insulin regimen. Also, call with sugars before next visit if they are uncontrolled - I advised her to: Patient Instructions  Please continue U500 insulin: 15 units 2x a day before meals.  Please return in 1.5 months with your sugar log.   Please stop at the lab.  - check 3 times a day, rotating checks - needs a new eye exam (Dr.  Zigmund Daniel)  >> again advised to schedule - brings Pt Assistance Form from [place] >> I will fill it out + fax it along with U500 pen Rx - check Fructosamine today - Return to clinic in 1.5 months with sugar log  2. Diabetic peripheral neuropathy - on Neurontin to 600 mg 3 times a day - on neuropathy cream: mix Amitriptyline 2%, Lidocaine 2%, and Ketamine 1% - apply on feet 2x a day - better after her stents  Fructosamine not checked >> will do this at next visit.

## 2015-05-07 NOTE — Patient Instructions (Signed)
Patient Instructions  Please continue U500 insulin: 15 units 2x a day before meals.  Please return in 1.5 months with your sugar log.   Please stop at the lab.

## 2015-05-09 ENCOUNTER — Ambulatory Visit (HOSPITAL_COMMUNITY): Payer: Medicare Other

## 2015-05-09 DIAGNOSIS — R269 Unspecified abnormalities of gait and mobility: Secondary | ICD-10-CM | POA: Diagnosis not present

## 2015-05-09 DIAGNOSIS — I213 ST elevation (STEMI) myocardial infarction of unspecified site: Secondary | ICD-10-CM | POA: Diagnosis not present

## 2015-05-09 DIAGNOSIS — E119 Type 2 diabetes mellitus without complications: Secondary | ICD-10-CM | POA: Diagnosis not present

## 2015-05-09 DIAGNOSIS — I10 Essential (primary) hypertension: Secondary | ICD-10-CM | POA: Diagnosis not present

## 2015-05-09 DIAGNOSIS — M6281 Muscle weakness (generalized): Secondary | ICD-10-CM | POA: Diagnosis not present

## 2015-05-12 ENCOUNTER — Ambulatory Visit (HOSPITAL_COMMUNITY): Payer: Medicare Other

## 2015-05-12 ENCOUNTER — Telehealth: Payer: Self-pay | Admitting: *Deleted

## 2015-05-12 DIAGNOSIS — E1159 Type 2 diabetes mellitus with other circulatory complications: Secondary | ICD-10-CM | POA: Diagnosis not present

## 2015-05-12 DIAGNOSIS — I213 ST elevation (STEMI) myocardial infarction of unspecified site: Secondary | ICD-10-CM | POA: Diagnosis not present

## 2015-05-12 DIAGNOSIS — I10 Essential (primary) hypertension: Secondary | ICD-10-CM | POA: Diagnosis not present

## 2015-05-12 DIAGNOSIS — E1142 Type 2 diabetes mellitus with diabetic polyneuropathy: Secondary | ICD-10-CM | POA: Diagnosis not present

## 2015-05-12 DIAGNOSIS — R159 Full incontinence of feces: Secondary | ICD-10-CM | POA: Diagnosis not present

## 2015-05-12 DIAGNOSIS — M6281 Muscle weakness (generalized): Secondary | ICD-10-CM | POA: Diagnosis not present

## 2015-05-12 DIAGNOSIS — E119 Type 2 diabetes mellitus without complications: Secondary | ICD-10-CM | POA: Diagnosis not present

## 2015-05-12 DIAGNOSIS — D473 Essential (hemorrhagic) thrombocythemia: Secondary | ICD-10-CM | POA: Diagnosis not present

## 2015-05-12 DIAGNOSIS — K529 Noninfective gastroenteritis and colitis, unspecified: Secondary | ICD-10-CM | POA: Diagnosis not present

## 2015-05-12 DIAGNOSIS — R269 Unspecified abnormalities of gait and mobility: Secondary | ICD-10-CM | POA: Diagnosis not present

## 2015-05-12 DIAGNOSIS — J418 Mixed simple and mucopurulent chronic bronchitis: Secondary | ICD-10-CM | POA: Diagnosis not present

## 2015-05-12 DIAGNOSIS — I2510 Atherosclerotic heart disease of native coronary artery without angina pectoris: Secondary | ICD-10-CM | POA: Diagnosis not present

## 2015-05-12 MED ORDER — ROSUVASTATIN CALCIUM 20 MG PO TABS
20.0000 mg | ORAL_TABLET | Freq: Every day | ORAL | Status: DC
Start: 2015-05-12 — End: 2016-08-23

## 2015-05-12 NOTE — Telephone Encounter (Signed)
Refiled prescription information for patient for [employer] patient assistance program.

## 2015-05-13 DIAGNOSIS — E119 Type 2 diabetes mellitus without complications: Secondary | ICD-10-CM | POA: Diagnosis not present

## 2015-05-13 DIAGNOSIS — M6281 Muscle weakness (generalized): Secondary | ICD-10-CM | POA: Diagnosis not present

## 2015-05-13 DIAGNOSIS — I10 Essential (primary) hypertension: Secondary | ICD-10-CM | POA: Diagnosis not present

## 2015-05-13 DIAGNOSIS — I213 ST elevation (STEMI) myocardial infarction of unspecified site: Secondary | ICD-10-CM | POA: Diagnosis not present

## 2015-05-13 DIAGNOSIS — R269 Unspecified abnormalities of gait and mobility: Secondary | ICD-10-CM | POA: Diagnosis not present

## 2015-05-14 ENCOUNTER — Ambulatory Visit (HOSPITAL_COMMUNITY): Payer: Medicare Other

## 2015-05-15 ENCOUNTER — Encounter: Payer: Self-pay | Admitting: Physician Assistant

## 2015-05-15 ENCOUNTER — Ambulatory Visit: Payer: Medicare Other | Admitting: Physician Assistant

## 2015-05-15 ENCOUNTER — Ambulatory Visit (INDEPENDENT_AMBULATORY_CARE_PROVIDER_SITE_OTHER): Payer: Medicare Other | Admitting: Physician Assistant

## 2015-05-15 VITALS — BP 140/80 | HR 82 | Ht 63.0 in | Wt 213.2 lb

## 2015-05-15 DIAGNOSIS — I10 Essential (primary) hypertension: Secondary | ICD-10-CM

## 2015-05-15 DIAGNOSIS — K589 Irritable bowel syndrome without diarrhea: Secondary | ICD-10-CM

## 2015-05-15 DIAGNOSIS — D45 Polycythemia vera: Secondary | ICD-10-CM

## 2015-05-15 DIAGNOSIS — K7460 Unspecified cirrhosis of liver: Secondary | ICD-10-CM

## 2015-05-15 DIAGNOSIS — I2510 Atherosclerotic heart disease of native coronary artery without angina pectoris: Secondary | ICD-10-CM

## 2015-05-15 DIAGNOSIS — Z7901 Long term (current) use of anticoagulants: Secondary | ICD-10-CM

## 2015-05-15 MED ORDER — DICYCLOMINE HCL 10 MG PO CAPS: ORAL_CAPSULE | ORAL | Status: DC

## 2015-05-15 NOTE — Progress Notes (Signed)
i agree with the above note, plan 

## 2015-05-15 NOTE — Patient Instructions (Signed)
We sent a prescription to [facility] for the Bentyl ( dicyclomine) 10 mg.   Take 1 tab every morning, may take twice daily as needed.   We have given you a probiotic, Align, take 1 capsule daily. You can get this at any pharmacy , [facility], [place], [employer].   We made you an appointment with Dr. Owens Loffler for 07-21-2015 at 9:45 am.   Please call us sooner if needed.

## 2015-05-15 NOTE — Progress Notes (Signed)
Patient ID: Amanda [name], female   DOB: May 30, 1941, 74 y.o.   MRN: [id number]   Subjective:    Patient ID: Amanda [name], female    DOB: 1940-11-16, 74 y.o.   MRN: [id number]  HPI  Amanda [name] is a pleasant 74 year old white female known to Dr. [name] who is referred today by Dr. Billey Chang for evaluation of diarrhea. Had patient has history of C. difficile colitis in 2015 and also has history of IBS. 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 She has history of polycythemia vera hypertension and adult-onset diabetes mellitus. She relates that she had an MI in April 2016 and underwent coronary stenting 2 and is now on Brilinta. Patient says that she has a very long history of intermittent diarrhea and that that really has not changed she is not having diarrhea on a daily basis but says that her episodes are sporadic and unpredictable. Unfortunately they are associated frequently with incontinence. She says she has gotten "afraid to go out". She says she worries all the time about whether she can get to a bathroom if she needs to. She had stool studies done earlier this summer all of which were unremarkable including the stool for C. difficile. He has been given a prescription for Lomotil which she says is helpful and she's been using it as needed if she plans to go out were out to eat. She feels that some of her episodes are triggered by stress and anxiety, and also seems to be intolerant to coleslaw and salads especially when eating out. Appendectomy in if she has an episode of diarrhea this is one episode very urgent often incontinent and loose to mushy "" stool. She will not have any further episodes of diarrhea the remainder of the day and then goes back to having normal bowel movements the following day. She is not having any ongoing abdominal pain. She says her appetite is been decreased since her MI. Last colonoscopy was done by Dr. Oletta Lamas in July 2012 she  did have several small polyps removed.  Consistent with tubular adenomas and she was advised to have follow-up in 3 years. Last EGD here in June 2015 no varices.  Review of Systems Pertinent positive and negative review of systems were noted in the above HPI section.  All other review of systems was otherwise negative.  Outpatient Encounter Prescriptions as of 05/15/2015  Medication Sig  . acetaminophen-codeine (TYLENOL #3) 300-30 MG per tablet Take one or two tablets every 6 hours prn pain  . ALPRAZolam (XANAX) 0.25 MG tablet Take 1 tablet (0.25 mg total) by mouth 3 (three) times daily as needed for anxiety.  Marland Kitchen amLODipine (NORVASC) 10 MG tablet Take 1 tablet (10 mg total) by mouth daily.  Marland Kitchen aspirin EC 81 MG tablet Take 81 mg by mouth daily.  . B-D INS SYR ULTRAFINE 1CC/31G 31G X 5/16" 1 ML MISC   . diphenhydrAMINE (BENADRYL) 25 mg capsule Take 25 mg by mouth every 6 (six) hours as needed.  . fluticasone (FLONASE) 50 MCG/ACT nasal spray Place 2 sprays into both nostrils daily.  Marland Kitchen gabapentin (NEURONTIN) 100 MG capsule Take up to 6 tablets 3x a day as needed for pain  . glucose blood (BAYER CONTOUR NEXT TEST) test strip Use to test blood sugar 3 times daily as instructed. Dx code: 20.62  . hydrochlorothiazide (MICROZIDE) 12.5 MG capsule Take 1 capsule (12.5 mg total) by mouth daily.  . hydrocortisone 2.5 % cream  Apply 1 application topically 2 (two) times daily.  . hydroxyurea (HYDREA) 500 MG capsule Take 3 capsules (1,500 mg total) by mouth daily. May take with food to minimize GI side effects.  . insulin regular human CONCENTRATED (HUMULIN R) 500 UNIT/ML SOLN injection Inject under skin 66 units before breakfast and dinner  . insulin regular human CONCENTRATED 500 UNIT/ML SOPN Inject 75 Units into the skin 2 (two) times daily before a meal.  . ketoconazole (NIZORAL) 2 % cream Apply topically daily. Apply to rash daily for 10 days  . loperamide (IMODIUM A-D) 2 MG tablet Take 2 mg by mouth  daily.  Marland Kitchen losartan (COZAAR) 100 MG tablet Take 1 tablet (100 mg total) by mouth daily.  . metoprolol (LOPRESSOR) 100 MG tablet Take 1 tablet (100 mg total) by mouth 2 (two) times daily.  . nitroGLYCERIN (NITROSTAT) 0.4 MG SL tablet Place 1 tablet (0.4 mg total) under the tongue every 5 (five) minutes as needed for chest pain.  . NONFORMULARY OR COMPOUNDED ITEM Neuropathy cream: mix Amitriptyline 2%, Lidocaine 2%, and Ketamine 1% - apply on feet 2x a day [facility].  Marland Kitchen nystatin cream (MYCOSTATIN) Apply 1 application topically 2 (two) times daily.  Marland Kitchen omega-3 acid ethyl esters (LOVAZA) 1 G capsule Take 1 capsule (1 g total) by mouth 2 (two) times daily.  . ondansetron (ZOFRAN) 8 MG tablet Take 8 mg by mouth every 8 (eight) hours as needed for nausea or vomiting.  Marland Kitchen oxyCODONE-acetaminophen (PERCOCET/ROXICET) 5-325 MG per tablet Take 1 tablet by mouth every 4 (four) hours as needed for moderate pain.  . Probiotic Product (PROBIOTIC DAILY PO) Take by mouth daily.  . ranitidine (ZANTAC) 300 MG tablet Take 1 tablet (300 mg total) by mouth at bedtime.  . rosuvastatin (CRESTOR) 20 MG tablet Take 1 tablet (20 mg total) by mouth daily.  . ticagrelor (BRILINTA) 90 MG TABS tablet Take 1 tablet (90 mg total) by mouth 2 (two) times daily.  Marland Kitchen dicyclomine (BENTYL) 10 MG capsule Take 1 tab by mouth every morning, may take twice daily as needed.   No facility-administered encounter medications on file as of 05/15/2015.   Allergies  Allergen Reactions  . Lisinopril Cough  . Tape Hives  . Doxycycline Hives, Swelling and Rash  . Latex Hives, Itching and Rash   Patient Active Problem List   Diagnosis Date Noted  . Obesity (BMI 30-39.9) 04/30/2015  . Nausea without vomiting 04/08/2015  . Easy bruising 03/31/2015  . Dry cough 03/04/2015  . Skin rash 03/01/2015  . Coronary artery disease involving native coronary artery 03/01/2015  . Acute diastolic heart failure 16/07/9603  . ST elevation myocardial  infarction (STEMI) involving left circumflex coronary artery in recovery phase 01/27/2015  . ST elevation myocardial infarction (STEMI) of inferior wall, initial episode of care   . Idioventricular rhythm   . Diabetic peripheral neuropathy associated with type 2 diabetes mellitus 01/23/2015  . Candidiasis of skin 09/30/2014  . Thrombocytosis 07/04/2014  . Preventive measure 07/04/2014  . Cholelithiasis 02/07/2014  . Clostridium difficile colitis 02/05/2014  . UTI (lower urinary tract infection) 01/29/2014  . Dizziness 01/29/2014  . Diarrhea 01/29/2014  . Dehydration 01/29/2014  . Candidiasis of genitalia in female 11/19/2013  . Cellulitis and abscess of buttock, left 11/01/2013  . Edema 07/09/2013  . Constipation 07/09/2013  . Type 2 diabetes mellitus with neurological manifestations, uncontrolled 06/07/2013  . Leukocytosis 06/02/2013  . Bacteria in urine 06/01/2013  . [name] 05/31/2013  . Essential  hypertension 05/31/2013  . History of Bell's palsy 05/26/2013  . DERMATITIS, ATOPIC 11/16/2010  . DIZZINESS 11/16/2010  . WEIGHT GAIN 11/16/2010  . HERPETIC WHITLOW 06/30/2010  . DIASTOLIC DYSFUNCTION 25/42/7062  . DYSPNEA 02/17/2010  . ABSCESS, SKIN 12/22/2009  . Hyperlipidemia 12/15/2009  . POLYCYTHEMIA 11/24/2009  . Essential hypertension, benign 11/24/2009  . PSORIASIS 11/24/2009  . Depressive disorder, not elsewhere classified 09/13/2009  . ABSCESS, LEG 09/13/2009  . SPRAIN/STRAIN, ANKLE NOS 01/21/2009  . Adiposity 07/08/2008  . CAFL (chronic airflow limitation) 07/08/2008  . Essential (primary) hypertension 07/08/2008   History   Social History  . Marital Status: Married    Spouse Name: N/A  . Number of Children: 2  . Years of Education: N/A   Occupational History  . housewife    Social History Main Topics  . Smoking status: Former Smoker -- 48 years    Types: Cigarettes    Quit date: 10/18/1986  . Smokeless tobacco: Never Used  . Alcohol Use: No    . Drug Use: No  . Sexual Activity: Yes    Birth Control/ Protection: None   Other Topics Concern  . Not on file   Social History Narrative    Ms. Mcneice's family history includes Breast cancer in her maternal aunt; Diabetes in her mother; Heart disease in her maternal grandmother, mother, and other; Hyperlipidemia in her mother; Hypertension in her mother; Kidney disease in her father and mother.      Objective:    Filed Vitals:   05/15/15 0949  BP: 140/80  Pulse: 82    Physical Exam   developed older white female in no acute distress, accompanied by her husband, blood pressure 140/80 pulse 82 height 5 foot 3 weight 213 BMI 37. HEENT; nontraumatic normocephalic EOMI PERRLA sclera anicteric, Supple; no JVD, Cardiovascular; regular rate and rhythm with S1-S2 no murmur or gallop, Pulmonary ;clear bilaterally, Abdomen; obese soft nontender nondistended bowel sounds are active there is no palpable mass or hepatosplenomegaly, Rectal; exam not done, Ext; no clubbing cyanosis or edema skin warm and dry, Neuropsych; mood and affect appropriate       Assessment & Plan:   #1 74 yo female with hx of IBS with sporadic episodes of diarrhea with incontinence with normal BM's in between- this is very likely IBS related #2 CAD -s/p recent MI 01/2015 #3 chronic anticoagulation-Brilinta #4 hx of adenomatous polyps- will need follow up colonoscopy #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

## 2015-05-16 ENCOUNTER — Ambulatory Visit (HOSPITAL_COMMUNITY): Payer: Medicare Other

## 2015-05-16 DIAGNOSIS — M6281 Muscle weakness (generalized): Secondary | ICD-10-CM | POA: Diagnosis not present

## 2015-05-16 DIAGNOSIS — E119 Type 2 diabetes mellitus without complications: Secondary | ICD-10-CM | POA: Diagnosis not present

## 2015-05-16 DIAGNOSIS — I213 ST elevation (STEMI) myocardial infarction of unspecified site: Secondary | ICD-10-CM | POA: Diagnosis not present

## 2015-05-16 DIAGNOSIS — I10 Essential (primary) hypertension: Secondary | ICD-10-CM | POA: Diagnosis not present

## 2015-05-16 DIAGNOSIS — R269 Unspecified abnormalities of gait and mobility: Secondary | ICD-10-CM | POA: Diagnosis not present

## 2015-05-19 ENCOUNTER — Ambulatory Visit (HOSPITAL_COMMUNITY): Payer: Medicare Other

## 2015-05-20 ENCOUNTER — Telehealth: Payer: Self-pay | Admitting: *Deleted

## 2015-05-20 DIAGNOSIS — M6281 Muscle weakness (generalized): Secondary | ICD-10-CM | POA: Diagnosis not present

## 2015-05-20 DIAGNOSIS — E119 Type 2 diabetes mellitus without complications: Secondary | ICD-10-CM | POA: Diagnosis not present

## 2015-05-20 DIAGNOSIS — R269 Unspecified abnormalities of gait and mobility: Secondary | ICD-10-CM | POA: Diagnosis not present

## 2015-05-20 DIAGNOSIS — I10 Essential (primary) hypertension: Secondary | ICD-10-CM | POA: Diagnosis not present

## 2015-05-20 DIAGNOSIS — I213 ST elevation (STEMI) myocardial infarction of unspecified site: Secondary | ICD-10-CM | POA: Diagnosis not present

## 2015-05-20 NOTE — Telephone Encounter (Signed)
[name] sent her sugar log with readings to Dr Cruzita Lederer. Dr Cruzita Lederer reviewed and advised for pt to increase her U500 to 24 units in the am with breakfast. Please call back or send [place] her sugar log in 3-4 days. Called and lvm advising pt's husband. Advised him to call back with any further questions.

## 2015-05-21 ENCOUNTER — Ambulatory Visit (HOSPITAL_COMMUNITY): Payer: Medicare Other

## 2015-05-21 ENCOUNTER — Telehealth: Payer: Self-pay | Admitting: Cardiovascular Disease

## 2015-05-21 ENCOUNTER — Telehealth: Payer: Self-pay | Admitting: Gastroenterology

## 2015-05-21 NOTE — Telephone Encounter (Signed)
Orders received from [place] and given to [facility] for Dr.Kelly to sign.

## 2015-05-21 NOTE — Telephone Encounter (Signed)
Returned call to Amy with [facility].She stated she needed 2 orders signed by [facility].Advised to re fax orders to office.I will give to Dr.Kelly's nurse for a signature.

## 2015-05-21 NOTE — Telephone Encounter (Signed)
She is waiting to get back orders that was sent starting May 17th and last time she sent it was 6-29th it for 1 period.. She sent for the 2nd period on 7-12th. She have not received any of these.Please send them back asap.

## 2015-05-22 NOTE — Telephone Encounter (Signed)
Patient returned phone call. [facility] # [phone].

## 2015-05-22 NOTE — Telephone Encounter (Signed)
The pt states she did not call and ask for the referral a friend of hers did and she did not agree to that.  She does not want a referral to anyone else and will keep the appt as scheduled with Dr [name]

## 2015-05-22 NOTE — Telephone Encounter (Signed)
Left message on machine to call back in regards to switching care

## 2015-05-22 NOTE — Telephone Encounter (Signed)
Please ask Dr. [name]- is she wanting to transfer her care?

## 2015-05-22 NOTE — Telephone Encounter (Signed)
Amy is this ok to refer or do I need to ask Dr [name], you saw her last month?

## 2015-05-23 ENCOUNTER — Ambulatory Visit (HOSPITAL_COMMUNITY): Payer: Medicare Other

## 2015-05-26 ENCOUNTER — Ambulatory Visit (HOSPITAL_BASED_OUTPATIENT_CLINIC_OR_DEPARTMENT_OTHER): Payer: Medicare Other | Admitting: Hematology and Oncology

## 2015-05-26 ENCOUNTER — Ambulatory Visit (HOSPITAL_COMMUNITY): Payer: Medicare Other

## 2015-05-26 ENCOUNTER — Telehealth: Payer: Self-pay | Admitting: Hematology and Oncology

## 2015-05-26 ENCOUNTER — Encounter: Payer: Self-pay | Admitting: Hematology and Oncology

## 2015-05-26 ENCOUNTER — Other Ambulatory Visit (HOSPITAL_BASED_OUTPATIENT_CLINIC_OR_DEPARTMENT_OTHER): Payer: Medicare Other

## 2015-05-26 VITALS — BP 179/79 | HR 87 | Temp 98.4°F | Resp 19 | Ht 63.0 in | Wt 218.1 lb

## 2015-05-26 DIAGNOSIS — D45 Polycythemia vera: Secondary | ICD-10-CM | POA: Diagnosis not present

## 2015-05-26 DIAGNOSIS — D72829 Elevated white blood cell count, unspecified: Secondary | ICD-10-CM | POA: Diagnosis not present

## 2015-05-26 DIAGNOSIS — D473 Essential (hemorrhagic) thrombocythemia: Secondary | ICD-10-CM | POA: Diagnosis not present

## 2015-05-26 DIAGNOSIS — D75839 Thrombocytosis, unspecified: Secondary | ICD-10-CM

## 2015-05-26 LAB — CBC WITH DIFFERENTIAL/PLATELET
BASO%: 1 % (ref 0.0–2.0)
Basophils Absolute: 0.2 10*3/uL — ABNORMAL HIGH (ref 0.0–0.1)
EOS%: 2.3 % (ref 0.0–7.0)
Eosinophils Absolute: 0.4 10*3/uL (ref 0.0–0.5)
HCT: 39.6 % (ref 34.8–46.6)
HEMOGLOBIN: 12.7 g/dL (ref 11.6–15.9)
LYMPH%: 13.2 % — ABNORMAL LOW (ref 14.0–49.7)
MCH: 31 pg (ref 25.1–34.0)
MCHC: 32.1 g/dL (ref 31.5–36.0)
MCV: 96.5 fL (ref 79.5–101.0)
MONO#: 0.6 10*3/uL (ref 0.1–0.9)
MONO%: 3.9 % (ref 0.0–14.0)
NEUT%: 79.6 % — AB (ref 38.4–76.8)
NEUTROS ABS: 13.3 10*3/uL — AB (ref 1.5–6.5)
PLATELETS: 900 10*3/uL — AB (ref 145–400)
RBC: 4.1 10*6/uL (ref 3.70–5.45)
RDW: 14.5 % (ref 11.2–14.5)
WBC: 16.8 10*3/uL — ABNORMAL HIGH (ref 3.9–10.3)
lymph#: 2.2 10*3/uL (ref 0.9–3.3)

## 2015-05-26 LAB — TECHNOLOGIST REVIEW

## 2015-05-26 MED ORDER — ANAGRELIDE HCL 1 MG PO CAPS: 1.0000 mg | ORAL_CAPSULE | Freq: Two times a day (BID) | ORAL | Status: DC

## 2015-05-26 NOTE — Assessment & Plan Note (Signed)
This is related to the spectrum of her myeloproliferative disorder. I am increasing the dose of hydroxyurea as above.

## 2015-05-26 NOTE — Telephone Encounter (Signed)
Gave and printed appt sched and avs for pt for AUG

## 2015-05-26 NOTE — Assessment & Plan Note (Signed)
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 I will increase her hydroxyurea dose as above and add anagrelide

## 2015-05-26 NOTE — Progress Notes (Signed)
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 The patient had bone marrow aspirate and biopsy performed in [place] and confirmed myeloproliferative disorder. We do not have records of those results. She had been treated with phlebotomy, hydroxyurea and aspirin.  The patient also have liver cirrhosis with splenomegaly, on observation On 07/04/2014, hydroxyurea was increased to 2 tablets daily except on Saturdays and Sundays she take 3 tablets On 09/30/2014, I increase hydroxyurea to 3 tablets daily In April 2016, she had heart attack with stent placement. She was taken off hydroxyurea On 02/27/15: Hydrea resumed at 500 mg daily On 03/31/2015, hydroxyurea dose was increased to 1000 mg daily On 04/29/2015, hydroxyurea dose was increased to 1500 mg daily On 05/26/2015, hydroxyurea dose was increased to 2000 mg daily and anagrelide 1 mg daily was added  INTERVAL HISTORY: Please see below for problem oriented charting. She feels well. Denies any chest pain or shortness of breath. She had mild bruises from antiplatelets agents. The patient denies any recent signs or symptoms of bleeding such as spontaneous epistaxis, hematuria or hematochezia. She denies missing doses. Denies recent infection  REVIEW OF SYSTEMS:   Constitutional: Denies fevers, chills or abnormal weight loss Eyes: Denies blurriness of vision Ears, nose, mouth, throat, and face: Denies mucositis or sore throat Respiratory: Denies cough, dyspnea or  wheezes Cardiovascular: Denies palpitation, chest discomfort or lower extremity swelling Gastrointestinal:  Denies nausea, heartburn or change in bowel habits Skin: Denies abnormal skin rashes Lymphatics: Denies new lymphadenopathy or easy bruising Neurological:Denies numbness, tingling or new weaknesses Behavioral/Psych: Mood is stable, no new changes  All other systems were reviewed with the patient and are negative.  I have reviewed the past medical history, past surgical history, social history and family history with the patient and they are unchanged from previous note.  ALLERGIES:  is allergic to lisinopril; tape; doxycycline; and latex.  MEDICATIONS:  Current Outpatient Prescriptions  Medication Sig Dispense Refill  . acetaminophen-codeine (TYLENOL #3) 300-30 MG per tablet Take one or two tablets every 6 hours prn pain 30 tablet 0  . ALPRAZolam (XANAX) 0.25 MG tablet Take 1 tablet (0.25 mg total) by mouth 3 (three) times daily as needed for anxiety. 20 tablet 0  . amLODipine (NORVASC) 10 MG tablet Take 1 tablet (10 mg total) by mouth daily. 30 tablet 11  . aspirin EC 81 MG tablet Take 81 mg by mouth daily.    . B-D INS SYR ULTRAFINE 1CC/31G 31G X 5/16" 1 ML MISC     . dicyclomine (BENTYL) 10 MG capsule Take 1 tab by mouth every morning, may take twice daily as needed. 60 capsule 8  . diphenhydrAMINE (BENADRYL) 25 mg capsule Take 25 mg by mouth every 6 (six) hours as needed.    . fluticasone (FLONASE) 50 MCG/ACT nasal spray Place 2 sprays into both nostrils daily. 16 g 6  . gabapentin (NEURONTIN) 100 MG capsule Take up to 6 tablets 3x a day as needed for pain 90 capsule 2  . glucose blood (BAYER CONTOUR NEXT TEST) test strip Use to test blood  sugar 3 times daily as instructed. Dx code: 250.62 100 each 11  . hydrochlorothiazide (MICROZIDE) 12.5 MG capsule Take 1 capsule (12.5 mg total) by mouth daily. 90 capsule 3  . hydrocortisone 2.5 % cream Apply 1 application topically 2 (two)  times daily.    . hydroxyurea (HYDREA) 500 MG capsule Take 3 capsules (1,500 mg total) by mouth daily. May take with food to minimize GI side effects. 90 capsule 0  . insulin regular human CONCENTRATED (HUMULIN R) 500 UNIT/ML SOLN injection Inject under skin 66 units before breakfast and dinner 20 mL 2  . insulin regular human CONCENTRATED 500 UNIT/ML SOPN Inject 75 Units into the skin 2 (two) times daily before a meal. 12 pen 1  . ketoconazole (NIZORAL) 2 % cream Apply topically daily. Apply to rash daily for 10 days 15 g 1  . loperamide (IMODIUM A-D) 2 MG tablet Take 2 mg by mouth daily.    Marland Kitchen losartan (COZAAR) 100 MG tablet Take 1 tablet (100 mg total) by mouth daily. 90 tablet 3  . metoprolol (LOPRESSOR) 100 MG tablet Take 1 tablet (100 mg total) by mouth 2 (two) times daily. 180 tablet 3  . nitroGLYCERIN (NITROSTAT) 0.4 MG SL tablet Place 1 tablet (0.4 mg total) under the tongue every 5 (five) minutes as needed for chest pain. 25 tablet 3  . NONFORMULARY OR COMPOUNDED ITEM Neuropathy cream: mix Amitriptyline 2%, Lidocaine 2%, and Ketamine 1% - apply on feet 2x a day [facility]. 1 each 2  . nystatin cream (MYCOSTATIN) Apply 1 application topically 2 (two) times daily.    Marland Kitchen omega-3 acid ethyl esters (LOVAZA) 1 G capsule Take 1 capsule (1 g total) by mouth 2 (two) times daily. 60 capsule 11  . ondansetron (ZOFRAN) 8 MG tablet Take 8 mg by mouth every 8 (eight) hours as needed for nausea or vomiting.    Marland Kitchen oxyCODONE-acetaminophen (PERCOCET/ROXICET) 5-325 MG per tablet Take 1 tablet by mouth every 4 (four) hours as needed for moderate pain. 20 tablet 0  . Probiotic Product (PROBIOTIC DAILY PO) Take by mouth daily.    . ranitidine (ZANTAC) 300 MG tablet Take 1 tablet (300 mg total) by mouth at bedtime. 30 tablet 5  . rosuvastatin (CRESTOR) 20 MG tablet Take 1 tablet (20 mg total) by mouth daily. 90 tablet 3  . ticagrelor (BRILINTA) 90 MG TABS tablet Take 1 tablet (90 mg total) by mouth 2  (two) times daily. 60 tablet 5  . anagrelide (AGRYLIN) 1 MG capsule Take 1 capsule (1 mg total) by mouth 2 (two) times daily. 60 capsule 0   No current facility-administered medications for this visit.    PHYSICAL EXAMINATION: ECOG PERFORMANCE STATUS: 0 - Asymptomatic  Filed Vitals:   05/26/15 0820  BP: 179/79  Pulse: 87  Temp: 98.4 F (36.9 C)  Resp: 19   Filed Weights   05/26/15 0820  Weight: 218 lb 1.6 oz (98.93 kg)    GENERAL:alert, no distress and comfortable SKIN: skin color, texture, turgor are normal, no rashes or significant lesions EYES: normal, Conjunctiva are pink and non-injected, sclera clear Musculoskeletal:no cyanosis of digits and no clubbing  NEURO: alert & oriented x 3 with fluent speech, no focal motor/sensory deficits  LABORATORY DATA:  I have reviewed the data as listed    Component Value Date/Time   NA 144 02/04/2015 0411   NA 133* 12/10/2013 1239   K 3.8 02/04/2015 0411   K 4.0 12/10/2013 1239   CL 104  02/04/2015 0411   CL 103 02/16/2013 0900   CO2 30 02/04/2015 0411   CO2 21* 12/10/2013 1239   GLUCOSE 109* 02/04/2015 0411   GLUCOSE 635* 12/10/2013 1239   GLUCOSE 396* 02/16/2013 0900   GLUCOSE 292 06/02/2010   BUN 37* 02/04/2015 0411   BUN 7.9 12/10/2013 1239   CREATININE 1.07 02/04/2015 0411   CREATININE 1.2* 12/10/2013 1239   CALCIUM 8.9 02/04/2015 0411   CALCIUM 9.5 12/10/2013 1239   PROT 6.2 01/27/2015 1320   PROT 6.7 12/10/2013 1239   ALBUMIN 3.5 01/27/2015 1320   ALBUMIN 3.7 12/10/2013 1239   AST 68* 01/27/2015 1320   AST 27 12/10/2013 1239   ALT 18 01/27/2015 1320   ALT 18 12/10/2013 1239   ALKPHOS 80 01/27/2015 1320   ALKPHOS 108 12/10/2013 1239   BILITOT 1.2 01/27/2015 1320   BILITOT 0.83 12/10/2013 1239   GFRNONAA 49* 02/04/2015 0411   GFRAA 57* 02/04/2015 0411    No results found for: SPEP, UPEP  Lab Results  Component Value Date   WBC 16.8* 05/26/2015   NEUTROABS 13.3* 05/26/2015   HGB 12.7 05/26/2015   HCT  39.6 05/26/2015   MCV 96.5 05/26/2015   PLT 900* 05/26/2015      Chemistry      Component Value Date/Time   NA 144 02/04/2015 0411   NA 133* 12/10/2013 1239   K 3.8 02/04/2015 0411   K 4.0 12/10/2013 1239   CL 104 02/04/2015 0411   CL 103 02/16/2013 0900   CO2 30 02/04/2015 0411   CO2 21* 12/10/2013 1239   BUN 37* 02/04/2015 0411   BUN 7.9 12/10/2013 1239   CREATININE 1.07 02/04/2015 0411   CREATININE 1.2* 12/10/2013 1239      Component Value Date/Time   CALCIUM 8.9 02/04/2015 0411   CALCIUM 9.5 12/10/2013 1239   ALKPHOS 80 01/27/2015 1320   ALKPHOS 108 12/10/2013 1239   AST 68* 01/27/2015 1320   AST 27 12/10/2013 1239   ALT 18 01/27/2015 1320   ALT 18 12/10/2013 1239   BILITOT 1.2 01/27/2015 1320   BILITOT 0.83 12/10/2013 1239      ASSESSMENT & PLAN:  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 The polycythemia is a little out of control. I will increase hydroxyurea to 2000 mg daily and will add anagrelide to get better control of her platelet count I plan to see her back in 3 weeks. If her blood counts remain poorly controlled, we might have to switch her to [facility]    Leukocytosis This is related to the spectrum of her myeloproliferative disorder. I am increasing the dose of hydroxyurea as above.     Thrombocytosis This is related to myeloproliferative disorder. I will increase her hydroxyurea dose as above and add anagrelide       No orders of the defined types were placed in this encounter.   All questions were answered. The patient knows to call the clinic with any problems, questions or concerns. No barriers to learning was detected. I spent 15 minutes counseling the patient face to face. The total time spent in the appointment was 20 minutes and more than 50% was on counseling and review of test results     [facility], [place], MD 05/26/2015 9:42 AM

## 2015-05-26 NOTE — Assessment & Plan Note (Signed)
She has been very compliant with taking her medications. The polycythemia is a little out of control. I will increase hydroxyurea to 2000 mg daily and will add anagrelide to get better control of her platelet count I plan to see her back in 3 weeks. If her blood counts remain poorly controlled, we might have to switch her to [facility]

## 2015-05-28 ENCOUNTER — Ambulatory Visit (HOSPITAL_COMMUNITY): Payer: Medicare Other

## 2015-05-29 ENCOUNTER — Telehealth: Payer: Self-pay | Admitting: *Deleted

## 2015-05-29 NOTE — Telephone Encounter (Signed)
Faxed signed PT/INR verbal order and also home health plan of care certification to [facility].

## 2015-05-30 ENCOUNTER — Ambulatory Visit (HOSPITAL_COMMUNITY): Payer: Medicare Other

## 2015-06-02 ENCOUNTER — Ambulatory Visit (HOSPITAL_COMMUNITY): Payer: Medicare Other

## 2015-06-03 ENCOUNTER — Other Ambulatory Visit: Payer: Self-pay

## 2015-06-03 DIAGNOSIS — Z1231 Encounter for screening mammogram for malignant neoplasm of breast: Secondary | ICD-10-CM

## 2015-06-04 ENCOUNTER — Ambulatory Visit (HOSPITAL_COMMUNITY): Payer: Medicare Other

## 2015-06-04 ENCOUNTER — Other Ambulatory Visit: Payer: Self-pay | Admitting: *Deleted

## 2015-06-04 ENCOUNTER — Telehealth: Payer: Self-pay | Admitting: Internal Medicine

## 2015-06-04 MED ORDER — INSULIN REGULAR HUMAN (CONC) 500 UNIT/ML ~~LOC~~ SOLN: SUBCUTANEOUS | Status: DC

## 2015-06-04 NOTE — Telephone Encounter (Signed)
Refill sent to [facility].

## 2015-06-04 NOTE — Telephone Encounter (Signed)
Patient need a refill of Humlin R  [facility]

## 2015-06-05 ENCOUNTER — Ambulatory Visit
Admission: RE | Admit: 2015-06-05 | Discharge: 2015-06-05 | Disposition: A | Discharge status: Home / Self Care | Payer: Medicare Other | Source: Ambulatory Visit

## 2015-06-05 ENCOUNTER — Ambulatory Visit: Payer: Medicare Other

## 2015-06-05 DIAGNOSIS — Z1231 Encounter for screening mammogram for malignant neoplasm of breast: Secondary | ICD-10-CM | POA: Diagnosis not present

## 2015-06-06 ENCOUNTER — Ambulatory Visit (HOSPITAL_COMMUNITY): Payer: Medicare Other

## 2015-06-09 ENCOUNTER — Ambulatory Visit (HOSPITAL_COMMUNITY): Payer: Medicare Other

## 2015-06-11 ENCOUNTER — Ambulatory Visit (HOSPITAL_COMMUNITY): Payer: Medicare Other

## 2015-06-12 ENCOUNTER — Telehealth: Payer: Self-pay | Admitting: Internal Medicine

## 2015-06-12 MED ORDER — INSULIN REGULAR HUMAN (CONC) 500 UNIT/ML ~~LOC~~ SOPN: PEN_INJECTOR | SUBCUTANEOUS | Status: DC

## 2015-06-12 NOTE — Telephone Encounter (Signed)
The humulin r u 500 was originally RX in vials and pt is asking for [facility] pens-please call [facility] back  This is Engineer, site # [phone], reference # [id number]

## 2015-06-12 NOTE — Telephone Encounter (Signed)
Sent new rx for Humulin R U-500 Kwikpens to [facility].

## 2015-06-13 ENCOUNTER — Ambulatory Visit (HOSPITAL_COMMUNITY): Payer: Medicare Other

## 2015-06-16 ENCOUNTER — Ambulatory Visit (HOSPITAL_COMMUNITY): Payer: Medicare Other

## 2015-06-16 ENCOUNTER — Telehealth: Payer: Self-pay | Admitting: Internal Medicine

## 2015-06-16 NOTE — Telephone Encounter (Signed)
Pharmacy called needs to clarify directions for prescription please call them back and reference number [id number]

## 2015-06-17 ENCOUNTER — Other Ambulatory Visit: Payer: Self-pay | Admitting: *Deleted

## 2015-06-17 ENCOUNTER — Ambulatory Visit (HOSPITAL_BASED_OUTPATIENT_CLINIC_OR_DEPARTMENT_OTHER): Payer: Medicare Other | Admitting: Hematology and Oncology

## 2015-06-17 ENCOUNTER — Telehealth: Payer: Self-pay | Admitting: Internal Medicine

## 2015-06-17 ENCOUNTER — Other Ambulatory Visit (HOSPITAL_BASED_OUTPATIENT_CLINIC_OR_DEPARTMENT_OTHER): Payer: Medicare Other

## 2015-06-17 ENCOUNTER — Encounter: Payer: Self-pay | Admitting: Hematology and Oncology

## 2015-06-17 VITALS — BP 137/53 | HR 99 | Temp 97.5°F | Resp 19 | Ht 63.0 in | Wt 209.7 lb

## 2015-06-17 DIAGNOSIS — K521 Toxic gastroenteritis and colitis: Secondary | ICD-10-CM | POA: Diagnosis not present

## 2015-06-17 DIAGNOSIS — D45 Polycythemia vera: Secondary | ICD-10-CM | POA: Diagnosis not present

## 2015-06-17 DIAGNOSIS — D72829 Elevated white blood cell count, unspecified: Secondary | ICD-10-CM | POA: Diagnosis not present

## 2015-06-17 DIAGNOSIS — D75839 Thrombocytosis, unspecified: Secondary | ICD-10-CM

## 2015-06-17 DIAGNOSIS — D473 Essential (hemorrhagic) thrombocythemia: Secondary | ICD-10-CM | POA: Diagnosis not present

## 2015-06-17 DIAGNOSIS — R630 Anorexia: Secondary | ICD-10-CM | POA: Insufficient documentation

## 2015-06-17 LAB — CBC WITH DIFFERENTIAL/PLATELET
BASO%: 0.5 % (ref 0.0–2.0)
Basophils Absolute: 0.1 10*3/uL (ref 0.0–0.1)
EOS%: 3 % (ref 0.0–7.0)
Eosinophils Absolute: 0.5 10*3/uL (ref 0.0–0.5)
HCT: 40.5 % (ref 34.8–46.6)
HGB: 13.8 g/dL (ref 11.6–15.9)
LYMPH%: 16.8 % (ref 14.0–49.7)
MCH: 31.5 pg (ref 25.1–34.0)
MCHC: 34.1 g/dL (ref 31.5–36.0)
MCV: 92.5 fL (ref 79.5–101.0)
MONO#: 0.7 10*3/uL (ref 0.1–0.9)
MONO%: 3.8 % (ref 0.0–14.0)
NEUT%: 75.9 % (ref 38.4–76.8)
NEUTROS ABS: 13.4 10*3/uL — AB (ref 1.5–6.5)
NRBC: 0 % (ref 0–0)
Platelets: 902 10*3/uL — ABNORMAL HIGH (ref 145–400)
RBC: 4.38 10*6/uL (ref 3.70–5.45)
RDW: 15 % — AB (ref 11.2–14.5)
WBC: 17.7 10*3/uL — ABNORMAL HIGH (ref 3.9–10.3)
lymph#: 3 10*3/uL (ref 0.9–3.3)

## 2015-06-17 MED ORDER — RUXOLITINIB PHOSPHATE 20 MG PO TABS: 20.0000 mg | ORAL_TABLET | Freq: Two times a day (BID) | ORAL | Status: DC

## 2015-06-17 MED ORDER — INSULIN REGULAR HUMAN (CONC) 500 UNIT/ML ~~LOC~~ SOPN: PEN_INJECTOR | SUBCUTANEOUS | Status: DC

## 2015-06-17 NOTE — Addendum Note (Signed)
Addended by: Rockie Neighbours B on: 06/17/2015 02:56 PM   Modules accepted: Orders

## 2015-06-17 NOTE — Assessment & Plan Note (Signed)
She has new onset of diarrhea since I started her on anagrelide and increased dose of hydroxyurea. Recommend she takes Imodium/Lomotil  for now until able to get medication approved and switch her over to [facility]

## 2015-06-17 NOTE — Telephone Encounter (Signed)
New Rx for [employer] faxed to [facility] at fax [phone].   Pt states she uses [facility] for her mail order meds and [facility] is now the specialty pharmacy for [facility].   Rx placed in [employer] for prior auth.

## 2015-06-17 NOTE — Assessment & Plan Note (Signed)
This is related to the spectrum of her myeloproliferative disorder. I am switching treatment as above. Clinically, she had no signs and symptoms to suggest infection.

## 2015-06-17 NOTE — Telephone Encounter (Signed)
Called and spoke with the pharmacist at [facility]. Pt is going to get the [facility] pen for the U-500. Pen only does increments of 5. Please advise to the dosage for pt. Thank you.

## 2015-06-17 NOTE — Telephone Encounter (Signed)
Pt called said she was returning phone call, please call pt back

## 2015-06-17 NOTE — Telephone Encounter (Signed)
Pt returned call. Pt is taking 24 units in the AM and 20 units in the evening. Please advise.

## 2015-06-17 NOTE — Telephone Encounter (Signed)
Can you please find out exactly how much she is taking now and I will convert.

## 2015-06-17 NOTE — Assessment & Plan Note (Signed)
Unfortunately, her CBC remained poorly controlled despite high-doses of hydroxyurea and the addition of anagrelide. She is also starting to experience symptoms of anorexia, weight loss, profound fatigue and diarrhea. I recommend we switch her treatment to [facility]. I gave her patient education handout. I will get insurance preauthorization for this and hopefully she will get her medication delivered next week. Once she received her medications, she would discontinue hydroxyurea and anagrelide. I have asked the patient to call me so that I can arrange follow-up appointment within 7-10 days after the start date of treatment. Explained to her and her husband the rationale of switching treatment and she agreed to proceed.

## 2015-06-17 NOTE — Telephone Encounter (Signed)
Ok. The corresponding dose for a U500 pen would be 120 units in am and 100 units before dinner.  We can start a little lower, at 100 units in am and 80 units before dinner, but please ask her to increase the the above dose after 2-3 days if no lows.  Also, please call with sugars on Mon.

## 2015-06-17 NOTE — Assessment & Plan Note (Signed)
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 I thing the splenomegaly could be causing early satiety I recommend switching treatment as above.

## 2015-06-17 NOTE — Assessment & Plan Note (Addendum)
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 As above, I will switch treatment. She will continue her antiplatelets agents for now

## 2015-06-17 NOTE — Telephone Encounter (Signed)
Called and spoke with pt's husband. Advised him per Dr Arman Filter message below. He voiced understanding. Sent an updated rx to [facility] with the correct dosage/sig.

## 2015-06-17 NOTE — Telephone Encounter (Signed)
Called pt and lvm advising her to return call. Please advise [place] how much insulin you are taking 2 times daily.

## 2015-06-17 NOTE — Progress Notes (Signed)
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 The patient had bone marrow aspirate and biopsy performed in [place] and confirmed myeloproliferative disorder. We do not have records of those results. She had been treated with phlebotomy, hydroxyurea and aspirin.  The patient also have liver cirrhosis with splenomegaly, on observation On 07/04/2014, hydroxyurea was increased to 2 tablets daily except on Saturdays and Sundays she take 3 tablets On 09/30/2014, I increase hydroxyurea to 3 tablets daily In April 2016, she had heart attack with stent placement. She was taken off hydroxyurea On 02/27/15: Hydrea resumed at 500 mg daily On 03/31/2015, hydroxyurea dose was increased to 1000 mg daily On 04/29/2015, hydroxyurea dose was increased to 1500 mg daily On 05/26/2015, hydroxyurea dose was increased to 2000 mg daily and anagrelide 1 mg daily was added  INTERVAL HISTORY: Please see below for problem oriented charting. She has multiple complaints today. She have progressive fatigue, anorexia, 9 pound weight loss, frequent diarrhea and just not feeling well. She denies recent infection. The patient denies any recent signs or symptoms of bleeding such as spontaneous epistaxis, hematuria or hematochezia. She has been compliant taking her medications. She has been taking regular Imodium/Lomotil. She denies feeling dehydrated with the diarrhea  REVIEW OF SYSTEMS:   Eyes: Denies blurriness of vision Ears, nose, mouth,  throat, and face: Denies mucositis or sore throat Respiratory: Denies cough, dyspnea or wheezes Cardiovascular: Denies palpitation, chest discomfort or lower extremity swelling Skin: Denies abnormal skin rashes Lymphatics: Denies new lymphadenopathy or easy bruising Neurological:Denies numbness, tingling or new weaknesses Behavioral/Psych: Mood is stable, no new changes  All other systems were reviewed with the patient and are negative.  I have reviewed the past medical history, past surgical history, social history and family history with the patient and they are unchanged from previous note.  ALLERGIES:  is allergic to lisinopril; tape; doxycycline; and latex.  MEDICATIONS:  Current Outpatient Prescriptions  Medication Sig Dispense Refill  . acetaminophen-codeine (TYLENOL #3) 300-30 MG per tablet Take one or two tablets every 6 hours prn pain 30 tablet 0  . ALPRAZolam (XANAX) 0.25 MG tablet Take 1 tablet (0.25 mg total) by mouth 3 (three) times daily as needed for anxiety. 20 tablet 0  . amLODipine (NORVASC) 10 MG tablet Take 1 tablet (10 mg total) by mouth daily. 30 tablet 11  . anagrelide (AGRYLIN) 1 MG capsule Take 1 capsule (1 mg total) by mouth 2 (two) times daily. 60 capsule 0  . aspirin EC 81 MG tablet Take 81 mg by mouth daily.    . B-D INS SYR ULTRAFINE 1CC/31G 31G X 5/16" 1 ML MISC     . dicyclomine (BENTYL) 10 MG capsule Take 1 tab by mouth every morning, may take twice daily as needed. 60 capsule 8  . diphenhydrAMINE (BENADRYL) 25 mg capsule Take 25 mg by mouth every 6 (six) hours as needed.    . fluticasone (FLONASE) 50 MCG/ACT nasal spray Place 2 sprays into both nostrils daily. 16 g 6  . gabapentin (NEURONTIN) 100 MG capsule Take up to 6 tablets  3x a day as needed for pain 90 capsule 2  . glucose blood (BAYER CONTOUR NEXT TEST) test strip Use to test blood sugar 3 times daily as instructed. Dx code: 250.62 100 each 11  . hydrochlorothiazide (MICROZIDE) 12.5 MG capsule Take  1 capsule (12.5 mg total) by mouth daily. 90 capsule 3  . hydrocortisone 2.5 % cream Apply 1 application topically 2 (two) times daily.    . hydroxyurea (HYDREA) 500 MG capsule Take 3 capsules (1,500 mg total) by mouth daily. May take with food to minimize GI side effects. 90 capsule 0  . insulin regular human CONCENTRATED (HUMULIN R U-500 KWIKPEN) 500 UNIT/ML injection Inject 66 units under the skin before breakfast and dinner. 8 pen 1  . insulin regular human CONCENTRATED (HUMULIN R) 500 UNIT/ML injection Inject under skin 66 units before breakfast and dinner 60 mL 1  . insulin regular human CONCENTRATED 500 UNIT/ML SOPN Inject 75 Units into the skin 2 (two) times daily before a meal. 12 pen 1  . ketoconazole (NIZORAL) 2 % cream Apply topically daily. Apply to rash daily for 10 days 15 g 1  . loperamide (IMODIUM A-D) 2 MG tablet Take 2 mg by mouth daily.    Marland Kitchen losartan (COZAAR) 100 MG tablet Take 1 tablet (100 mg total) by mouth daily. 90 tablet 3  . metoprolol (LOPRESSOR) 100 MG tablet Take 1 tablet (100 mg total) by mouth 2 (two) times daily. 180 tablet 3  . nitroGLYCERIN (NITROSTAT) 0.4 MG SL tablet Place 1 tablet (0.4 mg total) under the tongue every 5 (five) minutes as needed for chest pain. 25 tablet 3  . NONFORMULARY OR COMPOUNDED ITEM Neuropathy cream: mix Amitriptyline 2%, Lidocaine 2%, and Ketamine 1% - apply on feet 2x a day [facility]. 1 each 2  . nystatin cream (MYCOSTATIN) Apply 1 application topically 2 (two) times daily.    Marland Kitchen omega-3 acid ethyl esters (LOVAZA) 1 G capsule Take 1 capsule (1 g total) by mouth 2 (two) times daily. 60 capsule 11  . ondansetron (ZOFRAN) 8 MG tablet Take 8 mg by mouth every 8 (eight) hours as needed for nausea or vomiting.    Marland Kitchen oxyCODONE-acetaminophen (PERCOCET/ROXICET) 5-325 MG per tablet Take 1 tablet by mouth every 4 (four) hours as needed for moderate pain. 20 tablet 0  . Probiotic Product (PROBIOTIC DAILY PO) Take by mouth daily.    .  ranitidine (ZANTAC) 300 MG tablet Take 1 tablet (300 mg total) by mouth at bedtime. 30 tablet 5  . rosuvastatin (CRESTOR) 20 MG tablet Take 1 tablet (20 mg total) by mouth daily. 90 tablet 3  . ticagrelor (BRILINTA) 90 MG TABS tablet Take 1 tablet (90 mg total) by mouth 2 (two) times daily. 60 tablet 5  . ruxolitinib phosphate (JAKAFI) 20 MG tablet Take 1 tablet (20 mg total) by mouth 2 (two) times daily. 60 tablet 3   No current facility-administered medications for this visit.    PHYSICAL EXAMINATION: ECOG PERFORMANCE STATUS: 2 - Symptomatic, <50% confined to bed  Filed Vitals:   06/17/15 0806  BP: 137/53  Pulse: 99  Temp: 97.5 F (36.4 C)  Resp: 19   Filed Weights   06/17/15 0806  Weight: 209 lb 11.2 oz (95.119 kg)    GENERAL:alert, no distress and comfortable. She is morbidly obese SKIN: skin color, texture, turgor are normal, no rashes or significant lesions EYES: normal, Conjunctiva are pink and non-injected, sclera clear OROPHARYNX:no exudate, no erythema and lips, buccal mucosa,  and tongue normal  NECK: supple, thyroid normal size, non-tender, without nodularity LYMPH:  no palpable lymphadenopathy in the cervical, axillary or inguinal LUNGS: clear to auscultation and percussion with normal breathing effort HEART: regular rate & rhythm and no murmurs and no lower extremity edema ABDOMEN:abdomen soft, non-tender and normal bowel sounds. Palpable splenomegaly is noted Musculoskeletal:no cyanosis of digits and no clubbing  NEURO: alert & oriented x 3 with fluent speech, no focal motor/sensory deficits  LABORATORY DATA:  I have reviewed the data as listed    Component Value Date/Time   NA 144 02/04/2015 0411   NA 133* 12/10/2013 1239   K 3.8 02/04/2015 0411   K 4.0 12/10/2013 1239   CL 104 02/04/2015 0411   CL 103 02/16/2013 0900   CO2 30 02/04/2015 0411   CO2 21* 12/10/2013 1239   GLUCOSE 109* 02/04/2015 0411   GLUCOSE 635* 12/10/2013 1239   GLUCOSE 396*  02/16/2013 0900   GLUCOSE 292 06/02/2010   BUN 37* 02/04/2015 0411   BUN 7.9 12/10/2013 1239   CREATININE 1.07 02/04/2015 0411   CREATININE 1.2* 12/10/2013 1239   CALCIUM 8.9 02/04/2015 0411   CALCIUM 9.5 12/10/2013 1239   PROT 6.2 01/27/2015 1320   PROT 6.7 12/10/2013 1239   ALBUMIN 3.5 01/27/2015 1320   ALBUMIN 3.7 12/10/2013 1239   AST 68* 01/27/2015 1320   AST 27 12/10/2013 1239   ALT 18 01/27/2015 1320   ALT 18 12/10/2013 1239   ALKPHOS 80 01/27/2015 1320   ALKPHOS 108 12/10/2013 1239   BILITOT 1.2 01/27/2015 1320   BILITOT 0.83 12/10/2013 1239   GFRNONAA 49* 02/04/2015 0411   GFRAA 57* 02/04/2015 0411    No results found for: SPEP, UPEP  Lab Results  Component Value Date   WBC 17.7* 06/17/2015   NEUTROABS 13.4* 06/17/2015   HGB 13.8 06/17/2015   HCT 40.5 06/17/2015   MCV 92.5 06/17/2015   PLT 902* 06/17/2015      Chemistry      Component Value Date/Time   NA 144 02/04/2015 0411   NA 133* 12/10/2013 1239   K 3.8 02/04/2015 0411   K 4.0 12/10/2013 1239   CL 104 02/04/2015 0411   CL 103 02/16/2013 0900   CO2 30 02/04/2015 0411   CO2 21* 12/10/2013 1239   BUN 37* 02/04/2015 0411   BUN 7.9 12/10/2013 1239   CREATININE 1.07 02/04/2015 0411   CREATININE 1.2* 12/10/2013 1239      Component Value Date/Time   CALCIUM 8.9 02/04/2015 0411   CALCIUM 9.5 12/10/2013 1239   ALKPHOS 80 01/27/2015 1320   ALKPHOS 108 12/10/2013 1239   AST 68* 01/27/2015 1320   AST 27 12/10/2013 1239   ALT 18 01/27/2015 1320   ALT 18 12/10/2013 1239   BILITOT 1.2 01/27/2015 1320   BILITOT 0.83 12/10/2013 1239     ASSESSMENT & PLAN:  [name] Unfortunately, her CBC remained poorly controlled despite high-doses of hydroxyurea and the addition of anagrelide. She is also starting to experience symptoms of anorexia, weight loss, profound fatigue and diarrhea. I recommend we switch her treatment to [facility]. I gave her patient education handout. I will get insurance  preauthorization for this and hopefully she will get her medication delivered next week. Once she received her medications, she would discontinue hydroxyurea and anagrelide. I have asked the patient to call me so that I can arrange follow-up appointment within 7-10 days after the start date of treatment. Explained to her and her husband  the rationale of switching treatment and she agreed to proceed.   Diarrhea due to drug She has new onset of diarrhea since I started her on anagrelide and increased dose of hydroxyurea. Recommend she takes Imodium/Lomotil  for now until able to get medication approved and switch her over to Jakafi  Leukocytosis This is related to the spectrum of her myeloproliferative disorder. I am switching treatment as above. Clinically, she had no signs and symptoms to suggest infection.     Thrombocytosis This is related to myeloproliferative disorder. As above, I will switch treatment. 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 I thing the splenomegaly could be causing early satiety I recommend switching treatment as above.   No orders of the defined types were placed in this encounter.   All questions were answered. The patient knows to call the clinic with any problems, questions or concerns. No barriers to learning was detected. I spent 25 minutes counseling the patient face to face. The total time spent in the appointment was 40 minutes and more than 50% was on counseling and review of test results     [facility], [place], MD 06/17/2015 9:14 AM

## 2015-06-18 ENCOUNTER — Ambulatory Visit (HOSPITAL_COMMUNITY): Payer: Medicare Other

## 2015-06-18 ENCOUNTER — Encounter: Payer: Self-pay | Admitting: Hematology and Oncology

## 2015-06-18 NOTE — Progress Notes (Signed)
I faxed [employer] Shanon Brow request and advised them script was sent to [facility]

## 2015-06-18 NOTE — Telephone Encounter (Signed)
error 

## 2015-06-19 NOTE — Telephone Encounter (Signed)
Fax received from [facility].  "Jakafi 20 mg tabs is approved through 02-16-2016 under Medicare Part D benefit."

## 2015-06-19 NOTE — Telephone Encounter (Signed)
Megan with [facility] [phone]) called reporting they "received referral for [facility].  Prior Authorization is required.  We have clinical information on file and will start the process.  Call id any questions."

## 2015-06-20 ENCOUNTER — Telehealth: Payer: Self-pay | Admitting: *Deleted

## 2015-06-20 ENCOUNTER — Ambulatory Visit (HOSPITAL_COMMUNITY): Payer: Medicare Other

## 2015-06-20 ENCOUNTER — Encounter: Payer: Self-pay | Admitting: Hematology and Oncology

## 2015-06-20 NOTE — Progress Notes (Signed)
Per [facility] Shanon Brow is approved thru 02/16/16 under your medicare part D   --  [id number]. I will send to medical records.

## 2015-06-20 NOTE — Telephone Encounter (Signed)
Husband reports initially the copay for Jakafi was $2,000, but they got it down to $500 per month which is still too expensive..  They are in process w/ Pharmacy ([facility]) of applying for Co Pay assistance from manufacturer.   Husband wanted Dr. Alvy Bimler to know they are still working on getting medication.   He will call nurse back as soon as they find out if pt can qualify for the co-pay assistance.

## 2015-06-23 ENCOUNTER — Ambulatory Visit (HOSPITAL_COMMUNITY): Payer: Medicare Other

## 2015-06-24 ENCOUNTER — Telehealth: Payer: Self-pay

## 2015-06-24 NOTE — Telephone Encounter (Signed)
Fax from [facility] to let us know pt will be getting jakafi through [facility]

## 2015-06-25 ENCOUNTER — Telehealth: Payer: Self-pay | Admitting: *Deleted

## 2015-06-25 ENCOUNTER — Ambulatory Visit (HOSPITAL_COMMUNITY): Payer: Medicare Other

## 2015-06-25 NOTE — Telephone Encounter (Signed)
LVM for pt to f/u on Jakafi.  It looks like Rx had been transferred from [facility] to [facility] to [facility].   Asked pt to please return nurse's call on status of med and if I can be of any assistance.  We will need to schedule pt back for lab and MD visit once we know she has started new medication.

## 2015-06-26 ENCOUNTER — Telehealth: Payer: Self-pay | Admitting: *Deleted

## 2015-06-26 NOTE — Telephone Encounter (Signed)
[facility] sent facsimile confirmation of Jakafi prescription shipment.  Shanon Brow was shipped on 06-24-2015 with next business day delivery.

## 2015-06-27 ENCOUNTER — Ambulatory Visit (HOSPITAL_COMMUNITY): Payer: Medicare Other

## 2015-06-27 ENCOUNTER — Other Ambulatory Visit: Payer: Self-pay | Admitting: Hematology and Oncology

## 2015-06-27 ENCOUNTER — Encounter: Payer: Self-pay | Admitting: Hematology and Oncology

## 2015-06-27 ENCOUNTER — Telehealth: Payer: Self-pay | Admitting: *Deleted

## 2015-06-27 DIAGNOSIS — D45 Polycythemia vera: Secondary | ICD-10-CM

## 2015-06-27 NOTE — Telephone Encounter (Signed)
Pt has not started Jakafi yet.  She is going to start this morning.  Instructed pt to start [facility] today and take Twice daily.  Informed her we will schedule to see her back w/ lab in one week.  Please call if any problems or concerns prior to next appt.. She verbalized understanding.

## 2015-06-27 NOTE — Progress Notes (Signed)
PT approved for PAN grant 06/23/16-06/22/16 for $12,000. The medication covered is Jakafi(oral).

## 2015-06-27 NOTE — Telephone Encounter (Signed)
Placed order for labs and rtn visit on 9/19

## 2015-06-27 NOTE — Telephone Encounter (Signed)
PT. HAS RECEIVED THE JAKAFI MEDICATION. THE DIRECTIONS ARE TWICE A DAY BUT PT.'S HUSBAND WANTS TO BE SURE HIS WIFE IS TO TAKE ONLY ONE A DAY

## 2015-06-27 NOTE — Telephone Encounter (Signed)
-----   Message from [name], MD sent at 06/27/2015  8:15 AM EDT ----- Regarding: Shanon Brow Has she started yet? I need to put POF to see her back

## 2015-06-30 ENCOUNTER — Ambulatory Visit (HOSPITAL_COMMUNITY): Payer: Medicare Other

## 2015-06-30 ENCOUNTER — Telehealth: Payer: Self-pay | Admitting: Hematology and Oncology

## 2015-06-30 NOTE — Telephone Encounter (Signed)
s.w. pt and confirmed appt...ok and aware

## 2015-07-01 ENCOUNTER — Other Ambulatory Visit (INDEPENDENT_AMBULATORY_CARE_PROVIDER_SITE_OTHER): Payer: Medicare Other | Admitting: *Deleted

## 2015-07-01 ENCOUNTER — Encounter: Payer: Self-pay | Admitting: Internal Medicine

## 2015-07-01 ENCOUNTER — Ambulatory Visit (INDEPENDENT_AMBULATORY_CARE_PROVIDER_SITE_OTHER): Payer: Medicare Other | Admitting: Internal Medicine

## 2015-07-01 ENCOUNTER — Other Ambulatory Visit: Payer: Self-pay | Admitting: *Deleted

## 2015-07-01 VITALS — BP 130/76 | HR 72 | Temp 97.5°F | Resp 12 | Wt 213.0 lb

## 2015-07-01 DIAGNOSIS — G629 Polyneuropathy, unspecified: Secondary | ICD-10-CM

## 2015-07-01 DIAGNOSIS — IMO0002 Reserved for concepts with insufficient information to code with codable children: Secondary | ICD-10-CM

## 2015-07-01 DIAGNOSIS — E1149 Type 2 diabetes mellitus with other diabetic neurological complication: Secondary | ICD-10-CM

## 2015-07-01 DIAGNOSIS — E1141 Type 2 diabetes mellitus with diabetic mononeuropathy: Secondary | ICD-10-CM | POA: Diagnosis not present

## 2015-07-01 DIAGNOSIS — E1142 Type 2 diabetes mellitus with diabetic polyneuropathy: Secondary | ICD-10-CM

## 2015-07-01 DIAGNOSIS — Z23 Encounter for immunization: Secondary | ICD-10-CM | POA: Diagnosis not present

## 2015-07-01 DIAGNOSIS — E1165 Type 2 diabetes mellitus with hyperglycemia: Principal | ICD-10-CM

## 2015-07-01 MED ORDER — GLUCOSE BLOOD VI STRP: ORAL_STRIP | Status: DC

## 2015-07-01 MED ORDER — INSULIN PEN NEEDLE 32G X 5 MM MISC: Status: DC

## 2015-07-01 MED ORDER — ONETOUCH DELICA LANCETS FINE MISC: Status: DC

## 2015-07-01 NOTE — Progress Notes (Addendum)
Patient ID: Amanda [name], female   DOB: December 06, 1940, 74 y.o.   MRN: [id number]  HPI: Amanda [name] is a 74 y.o.-year-old female, returning for f/u for DM2, dx 2008, insulin-dependent since dx, uncontrolled, with complications (DR, PN, CAD). Last visit 2 mo ago.  She has coughing and vomiting 2/2 ACEI >> started ARB >> cough and vomiting stopped.  Last hemoglobin A1c was: 01/27/2015: HbA1c from fructosamine: 6.57% 05/16/2014: HbA1c from fructosamine: 7.95%. Lab Results  Component Value Date   HGBA1C 8.8* 01/27/2015   HGBA1C 9.8* 05/16/2014   HGBA1C 13.5* 01/31/2014  - in 10/16/2012, hemoglobin A1c was 6.9% - in 07/2012, hemoglobin A1c was 9.9% - in 11/16/2010, hemoglobin A1c was 12.8% - in 03/2011, hemoglobin A1c was 6.9% - In 07/10/2010 her hemoglobin A1c was 10.5% C-peptide was 6.3 and 01/2011.  Pt was on a regimen of: - Levemir 45 units 2x a day, in am and at bedtime - Humalog mealtime: 40 units with a smaller meal 45 units with a larger meal  - Sliding Scale of Humalog:  - 150- 165: + 1 unit  - 166- 180: + 2 units  - 181- 195: + 3 units  - 196- 210: + 4 units  - 211- 225: + 5 units  - 226- 240: + 6 units  - >240: + 7 units  She could not tolerate doses of metformin XR higher than 250 mg twice a day in the past due to diarrhea. She does not want to resume this. Was previously on the Victoza 1.8 mg daily - "made me sick". She was previously on Janumet between 2012-2013. She was on Invokana - but could not afford it while in the Medicare doughnut hole.  We switched to U500: - 15 units - on the syringe (0.15 mL) before breakfast >> 20 units >> pens (100 units) - 18 units - on the syringe (0.18 mL) before dinner >> 24 units >> pens (80 units)  Pt is not checking sugars recently >> ran out of strips. Reviewed sugars from last visit: We reviewed the sugars from last time: - am: 190-255 >> 209-347 >> same >> 90-211, 235 - 2h after b'fast: n/c >> 238 - before lunch: 140, 223,  247 >> 161-319 >> 258-290 >> 167-343 >> 175-340 >> 238 - 2h after lunch: 240-312 >> n/c >> 270-456 >> 112-218 - before dinner: 150-330 >> 150-170 >> 90 x1, 315 >> 175-336 >> 147, 273 - bedtime: 152-306 >> n/c >> 161-326 >> 164, 240, 240, 268 >> 273 No lows. Lowest sugar was 90x1 >> 70x1 >> 70-80s at 4 pm; she has hypoglycemia awareness at 100.   Pt's meals are - reviewed per last visit: - Breakfast: ("I am not a breakfast eater") 1 egg + toast; 3 pancakes + sugarfree syrup; waffle; french toast - not skipping anymore  - Lunch: sometimes skips, sometimes eats out: pizza, salad, no dessert - Dinner: at 5:30-6pm: soup, meat + vegetables + starch, dessert - Snacks: 8 pm: fruit, sugar free  She was seen for nutritional advice and diabetes education, with last visit on 04/2012 - [name].  - no CKD, last BUN/creatinine:  Lab Results  Component Value Date   BUN 37* 02/04/2015   CREATININE 1.07 02/04/2015  She is on ACEI.  - last set of lipids: Lab Results  Component Value Date   CHOL 168 01/27/2015   HDL 27* 01/27/2015   LDLCALC 88 01/27/2015   TRIG 266* 01/27/2015   CHOLHDL 6.2 01/27/2015  She  is on Pravastatin. - last eye exam was in 07/2013. 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 At last eye exam (Dr Zigmund Daniel): cataracts. - no numbness and tingling in her feet. For her peripheral neuropathy >> saw PCP, podiatrist, orthopedic >> tried capsaicin cream, compression stockings, then Neurontin >> on 600 mg 3x a day. She is on aspirin 81 mg daily.  She also has a history of hypertension- on HCTZ, Metoprolol, also diastolic dysfunction, hyperlipidemia, polycytemia vera, gout, psoriasis, depression, history of Bell's palsy, obesity, vitamin D deficiency.  On 01/27/2015 she had an AMI >> had 2 stents placed. She had very erratic sugars in the hospital.  She recently changed the PVera tx. Starts Jakafi.  ROS: Constitutional: no weight gain/loss, no fatigue Eyes: no blurry  vision, no xerophthalmia ENT: no sore throat, no nodules palpated in throat, no dysphagia/odynophagia, no hoarseness Cardiovascular: no CP/SOB/palpitations/leg swelling Respiratory:no  cough/no SOB Gastrointestinal: no N/V/D/C Musculoskeletal: no muscle/joint aches Skin: No rashes Neurological: no tremors/numbness/tingling/dizziness  I reviewed pt's medications, allergies, PMH, social hx, family hx, and changes were documented in the history of present illness. Otherwise, unchanged from my initial visit note.  PE: BP 130/76 mmHg  Pulse 72  Temp(Src) 97.5 F (36.4 C) (Oral)  Resp 12  Wt 213 lb (96.616 kg)  SpO2 95% Body mass index is 37.74 kg/(m^2). Wt Readings from Last 3 Encounters:  07/01/15 213 lb (96.616 kg)  06/17/15 209 lb 11.2 oz (95.119 kg)  05/26/15 218 lb 1.6 oz (98.93 kg)   Constitutional: overweight, in NAD Eyes: PERRLA, EOMI, no exophthalmos ENT: moist mucous membranes, no thyromegaly, no cervical lymphadenopathy Cardiovascular: tachycardia RR, No MRG, no leg swelling Respiratory: CTA B Gastrointestinal: abdomen soft, NT, ND, BS+ Musculoskeletal: no deformities, strength intact in all 4 Skin: moist, warm  ASSESSMENT: 1. 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 Diabetic peripheral neuropathy  PLAN:  1. Patient with long-standing, uncontrolled diabetes, doing better on U500, but she again does not bring any sugar checks today. I advised her to start checking and writing them down and call me with the CBGs so I can adjust the U500 dose. - I advised her to: Patient Instructions  Please continue U500 pens: - 100 units in am, 30 min before breakfast - 80 units in the evening, 30 min before dinner  Please return in 1.5 month with your sugar log.   Please stop at the lab.  - check 3 times a day, rotating checks - needs a new eye exam (Dr. Zigmund Daniel)  >> again advised to schedule!!! - check  Fructosamine today - will give her the flu vaccine today.  - Return to clinic in 1.5 months with sugar log  2. Diabetic peripheral neuropathy - on Neurontin to 600 mg 3 times a day - on neuropathy cream: mix Amitriptyline 2%, Lidocaine 2%, and Ketamine 1% - apply on feet 2x a day - better after her stents  Orders Placed This Encounter  Procedures  . Fructosamine   Office Visit on 07/01/2015  Component Date Value Ref Range Status  . Fructosamine 07/01/2015 292* 190 - 270 umol/L Final   The HbA1c calculated from the fructosamine is same as before, 6.57%!

## 2015-07-01 NOTE — Patient Instructions (Signed)
Please continue U500 pens: - 100 units in am, 30 min before breakfast - 80 units in the evening, 30 min before dinner  Please return in 1.5 month with your sugar log.   Please stop at the lab.

## 2015-07-02 ENCOUNTER — Ambulatory Visit (HOSPITAL_COMMUNITY): Payer: Medicare Other

## 2015-07-03 LAB — FRUCTOSAMINE: Fructosamine: 292 umol/L — ABNORMAL HIGH (ref 190–270)

## 2015-07-04 ENCOUNTER — Ambulatory Visit (HOSPITAL_COMMUNITY): Payer: Medicare Other

## 2015-07-07 ENCOUNTER — Ambulatory Visit (HOSPITAL_COMMUNITY): Payer: Medicare Other

## 2015-07-07 ENCOUNTER — Other Ambulatory Visit (HOSPITAL_BASED_OUTPATIENT_CLINIC_OR_DEPARTMENT_OTHER): Payer: Medicare Other

## 2015-07-07 ENCOUNTER — Telehealth: Payer: Self-pay | Admitting: Internal Medicine

## 2015-07-07 ENCOUNTER — Telehealth: Payer: Self-pay | Admitting: Hematology and Oncology

## 2015-07-07 ENCOUNTER — Encounter: Payer: Self-pay | Admitting: Hematology and Oncology

## 2015-07-07 ENCOUNTER — Ambulatory Visit (HOSPITAL_BASED_OUTPATIENT_CLINIC_OR_DEPARTMENT_OTHER): Payer: Medicare Other | Admitting: Hematology and Oncology

## 2015-07-07 VITALS — BP 185/66 | HR 87 | Temp 98.1°F | Resp 19 | Ht 63.0 in | Wt 215.7 lb

## 2015-07-07 DIAGNOSIS — D45 Polycythemia vera: Secondary | ICD-10-CM

## 2015-07-07 DIAGNOSIS — I10 Essential (primary) hypertension: Secondary | ICD-10-CM | POA: Diagnosis not present

## 2015-07-07 DIAGNOSIS — R140 Abdominal distension (gaseous): Secondary | ICD-10-CM | POA: Insufficient documentation

## 2015-07-07 LAB — CBC WITH DIFFERENTIAL/PLATELET
BASO%: 0.5 % (ref 0.0–2.0)
Basophils Absolute: 0.1 10*3/uL (ref 0.0–0.1)
EOS%: 1.9 % (ref 0.0–7.0)
Eosinophils Absolute: 0.3 10*3/uL (ref 0.0–0.5)
HCT: 41.5 % (ref 34.8–46.6)
HEMOGLOBIN: 13.8 g/dL (ref 11.6–15.9)
LYMPH#: 1.3 10*3/uL (ref 0.9–3.3)
LYMPH%: 10.2 % — ABNORMAL LOW (ref 14.0–49.7)
MCH: 31.4 pg (ref 25.1–34.0)
MCHC: 33.3 g/dL (ref 31.5–36.0)
MCV: 94.5 fL (ref 79.5–101.0)
MONO#: 0.9 10*3/uL (ref 0.1–0.9)
MONO%: 6.9 % (ref 0.0–14.0)
NEUT%: 80.5 % — ABNORMAL HIGH (ref 38.4–76.8)
NEUTROS ABS: 10.6 10*3/uL — AB (ref 1.5–6.5)
NRBC: 0 % (ref 0–0)
Platelets: 664 10*3/uL — ABNORMAL HIGH (ref 145–400)
RBC: 4.39 10*6/uL (ref 3.70–5.45)
RDW: 16.2 % — AB (ref 11.2–14.5)
WBC: 13.1 10*3/uL — ABNORMAL HIGH (ref 3.9–10.3)

## 2015-07-07 LAB — COMPREHENSIVE METABOLIC PANEL (CC13)
ALBUMIN: 4 g/dL (ref 3.5–5.0)
ALK PHOS: 95 U/L (ref 40–150)
ALT: 15 U/L (ref 0–55)
AST: 29 U/L (ref 5–34)
Anion Gap: 7 mEq/L (ref 3–11)
BUN: 9 mg/dL (ref 7.0–26.0)
CO2: 26 mEq/L (ref 22–29)
Calcium: 9.3 mg/dL (ref 8.4–10.4)
Chloride: 110 mEq/L — ABNORMAL HIGH (ref 98–109)
Creatinine: 0.9 mg/dL (ref 0.6–1.1)
EGFR: 66 mL/min/{1.73_m2} — ABNORMAL LOW (ref >90)
GLUCOSE: 123 mg/dL (ref 70–140)
POTASSIUM: 4.3 meq/L (ref 3.5–5.1)
SODIUM: 143 meq/L (ref 136–145)
TOTAL PROTEIN: 6.6 g/dL (ref 6.4–8.3)
Total Bilirubin: 0.65 mg/dL (ref 0.20–1.20)

## 2015-07-07 LAB — TECHNOLOGIST REVIEW

## 2015-07-07 MED ORDER — ONETOUCH DELICA LANCETS FINE MISC: Status: DC

## 2015-07-07 MED ORDER — INSULIN PEN NEEDLE 32G X 5 MM MISC: Status: DC

## 2015-07-07 NOTE — Assessment & Plan Note (Signed)
She is on multiple different medication for this. She does feel anxious every time she comes to this clinic. I recommend aggressive medical management by primary care doctor and cardiologist.

## 2015-07-07 NOTE — Telephone Encounter (Signed)
Gave and printed appt sched and avs for pt for Sept and OCT

## 2015-07-07 NOTE — Assessment & Plan Note (Signed)
So far, she tolerated Jakafi well apart from some abdominal bloating. The total white blood cell count and platelet count improving. I will bring the patient back on a weekly basis for CBC monitoring and I will see her back a month from now for further evaluation. She will continue same dose treatment without dose adjustment

## 2015-07-07 NOTE — Telephone Encounter (Signed)
Done

## 2015-07-07 NOTE — Assessment & Plan Note (Signed)
She has intermittent abdominal bloating which she attributed that to some side effects of treatment. She denies nausea, diarrhea or change in bowel habits. I recommend we continue the same treatment without dose adjustment

## 2015-07-07 NOTE — Progress Notes (Signed)
[facility] OFFICE PROGRESS NOTE  Patient Care Team: Leamon Arnt, MD as PCP - General (Family Medicine) [name], MD as Attending Physician (Endocrinology) [name], MD as Consulting Physician (Hematology and Oncology) [name], MD as Consulting Physician (Internal Medicine) [name], MD as Consulting Physician (Cardiology)  SUMMARY OF ONCOLOGIC HISTORY:   [name]   05/31/2013 Initial Diagnosis [name]   06/27/2015 -  Chemotherapy She started Jakafi    [name] who was found to have erythrocytosis requiring phlebotomy and tested negative for that JAK 2 to mutation. The patient had bone marrow aspirate and biopsy performed in [place] and confirmed myeloproliferative disorder. We do not have records of those results. She had been treated with phlebotomy, hydroxyurea and aspirin.  The patient also have liver cirrhosis with splenomegaly, on observation On 07/04/2014, hydroxyurea was increased to 2 tablets daily except on Saturdays and Sundays she take 3 tablets On 09/30/2014, I increase hydroxyurea to 3 tablets daily In April 2016, she had heart attack with stent placement. She was taken off hydroxyurea On 02/27/15: Hydrea resumed at 500 mg daily On 03/31/2015, hydroxyurea dose was increased to 1000 mg daily On 04/29/2015, hydroxyurea dose was increased to 1500 mg daily On 05/26/2015, hydroxyurea dose was increased to 2000 mg daily and anagrelide 1 mg daily was added INTERVAL HISTORY: Please see below for problem oriented charting. She returns for further follow-up. She tolerated the new treatment well apart from some abdominal bloating. Denies any change in bowel habits. Denies any recent infection.  REVIEW OF SYSTEMS:   Constitutional: Denies fevers, chills or abnormal weight loss Eyes: Denies blurriness of vision Ears, nose, mouth, throat, and face: Denies mucositis or sore throat Respiratory: Denies  cough, dyspnea or wheezes Cardiovascular: Denies palpitation, chest discomfort or lower extremity swelling Gastrointestinal:  Denies nausea, heartburn or change in bowel habits Skin: Denies abnormal skin rashes Lymphatics: Denies new lymphadenopathy or easy bruising Neurological:Denies numbness, tingling or new weaknesses Behavioral/Psych: Mood is stable, no new changes  All other systems were reviewed with the patient and are negative.  I have reviewed the past medical history, past surgical history, social history and family history with the patient and they are unchanged from previous note.  ALLERGIES:  is allergic to lisinopril; tape; doxycycline; and latex.  MEDICATIONS:  Current Outpatient Prescriptions  Medication Sig Dispense Refill  . amLODipine (NORVASC) 10 MG tablet Take 1 tablet (10 mg total) by mouth daily. 30 tablet 11  . aspirin EC 81 MG tablet Take 81 mg by mouth daily.    . B-D INS SYR ULTRAFINE 1CC/31G 31G X 5/16" 1 ML MISC     . dicyclomine (BENTYL) 10 MG capsule Take 1 tab by mouth every morning, may take twice daily as needed. 60 capsule 8  . diphenhydrAMINE (BENADRYL) 25 mg capsule Take 25 mg by mouth every 6 (six) hours as needed.    Marland Kitchen glucose blood (ONETOUCH VERIO) test strip Use to test blood sugar 2 times daily as instructed. Dx: E11.41 200 each 3  . hydrochlorothiazide (MICROZIDE) 12.5 MG capsule Take 1 capsule (12.5 mg total) by mouth daily. 90 capsule 3  . insulin regular human CONCENTRATED (HUMULIN R U-500 KWIKPEN) 500 UNIT/ML injection Inject 120 units under the skin before breakfast and 100 units before dinner. 18 pen 1  . insulin regular human CONCENTRATED (HUMULIN R) 500 UNIT/ML injection Inject under skin 66 units before breakfast and dinner 60 mL 1  . insulin  regular human CONCENTRATED 500 UNIT/ML SOPN Inject 75 Units into the skin 2 (two) times daily before a meal. 12 pen 1  . losartan (COZAAR) 100 MG tablet Take 1 tablet (100 mg total) by mouth daily.  90 tablet 3  . metoprolol (LOPRESSOR) 100 MG tablet Take 1 tablet (100 mg total) by mouth 2 (two) times daily. 180 tablet 3  . NONFORMULARY OR COMPOUNDED ITEM Neuropathy cream: mix Amitriptyline 2%, Lidocaine 2%, and Ketamine 1% - apply on feet 2x a day [facility]. 1 each 2  . nystatin cream (MYCOSTATIN) Apply 1 application topically 2 (two) times daily.    Marland Kitchen omega-3 acid ethyl esters (LOVAZA) 1 G capsule Take 1 capsule (1 g total) by mouth 2 (two) times daily. 60 capsule 11  . rosuvastatin (CRESTOR) 20 MG tablet Take 1 tablet (20 mg total) by mouth daily. 90 tablet 3  . ruxolitinib phosphate (JAKAFI) 20 MG tablet Take 1 tablet (20 mg total) by mouth 2 (two) times daily. 60 tablet 3  . ticagrelor (BRILINTA) 90 MG TABS tablet Take 1 tablet (90 mg total) by mouth 2 (two) times daily. 60 tablet 5  . ALPRAZolam (XANAX) 0.25 MG tablet Take 1 tablet (0.25 mg total) by mouth 3 (three) times daily as needed for anxiety. (Patient not taking: Reported on 07/07/2015) 20 tablet 0  . hydrocortisone 2.5 % cream Apply 1 application topically 2 (two) times daily.    . Insulin Pen Needle (CAREFINE PEN NEEDLES) 32G X 5 MM MISC Use to inject insulin 2 times daily as instructed. 190 each 1  . loperamide (IMODIUM A-D) 2 MG tablet Take 2 mg by mouth daily.    . nitroGLYCERIN (NITROSTAT) 0.4 MG SL tablet Place 1 tablet (0.4 mg total) under the tongue every 5 (five) minutes as needed for chest pain. (Patient not taking: Reported on 07/07/2015) 25 tablet 3  . ondansetron (ZOFRAN) 8 MG tablet Take 8 mg by mouth every 8 (eight) hours as needed for nausea or vomiting.    Glory Rosebush DELICA LANCETS FINE MISC Use to test blood sugar 2 times daily as instructed. Dx: E11.41 200 each 1  . oxyCODONE-acetaminophen (PERCOCET/ROXICET) 5-325 MG per tablet Take 1 tablet by mouth every 4 (four) hours as needed for moderate pain. (Patient not taking: Reported on 07/07/2015) 20 tablet 0  . Probiotic Product (PROBIOTIC DAILY PO) Take  by mouth daily.    . ranitidine (ZANTAC) 300 MG tablet Take 1 tablet (300 mg total) by mouth at bedtime. (Patient not taking: Reported on 07/07/2015) 30 tablet 5   No current facility-administered medications for this visit.    PHYSICAL EXAMINATION: ECOG PERFORMANCE STATUS: 1 - Symptomatic but completely ambulatory  Filed Vitals:   07/07/15 1052  BP: 185/66  Pulse: 87  Temp: 98.1 F (36.7 C)  Resp: 19   Filed Weights   07/07/15 1052  Weight: 215 lb 11.2 oz (97.841 kg)    GENERAL:alert, no distress and comfortable. she is morbidly obese. Appears anxious. SKIN: skin color, texture, turgor are normal, no rashes or significant lesions EYES: normal, Conjunctiva are pink and non-injected, sclera clear Musculoskeletal:no cyanosis of digits and no clubbing  NEURO: alert & oriented x 3 with fluent speech, no focal motor/sensory deficits  LABORATORY DATA:  I have reviewed the data as listed    Component Value Date/Time   NA 143 07/07/2015 1038   NA 144 02/04/2015 0411   K 4.3 07/07/2015 1038   K 3.8 02/04/2015 0411   CL  104 02/04/2015 0411   CL 103 02/16/2013 0900   CO2 26 07/07/2015 1038   CO2 30 02/04/2015 0411   GLUCOSE 123 07/07/2015 1038   GLUCOSE 109* 02/04/2015 0411   GLUCOSE 396* 02/16/2013 0900   GLUCOSE 292 06/02/2010   BUN 9.0 07/07/2015 1038   BUN 37* 02/04/2015 0411   CREATININE 0.9 07/07/2015 1038   CREATININE 1.07 02/04/2015 0411   CALCIUM 9.3 07/07/2015 1038   CALCIUM 8.9 02/04/2015 0411   PROT 6.6 07/07/2015 1038   PROT 6.2 01/27/2015 1320   ALBUMIN 4.0 07/07/2015 1038   ALBUMIN 3.5 01/27/2015 1320   AST 29 07/07/2015 1038   AST 68* 01/27/2015 1320   ALT 15 07/07/2015 1038   ALT 18 01/27/2015 1320   ALKPHOS 95 07/07/2015 1038   ALKPHOS 80 01/27/2015 1320   BILITOT 0.65 07/07/2015 1038   BILITOT 1.2 01/27/2015 1320   GFRNONAA 49* 02/04/2015 0411   GFRAA 57* 02/04/2015 0411    No results found for: SPEP, UPEP  Lab Results  Component Value Date    WBC 13.1* 07/07/2015   NEUTROABS 10.6* 07/07/2015   HGB 13.8 07/07/2015   HCT 41.5 07/07/2015   MCV 94.5 07/07/2015   PLT 664* 07/07/2015      Chemistry      Component Value Date/Time   NA 143 07/07/2015 1038   NA 144 02/04/2015 0411   K 4.3 07/07/2015 1038   K 3.8 02/04/2015 0411   CL 104 02/04/2015 0411   CL 103 02/16/2013 0900   CO2 26 07/07/2015 1038   CO2 30 02/04/2015 0411   BUN 9.0 07/07/2015 1038   BUN 37* 02/04/2015 0411   CREATININE 0.9 07/07/2015 1038   CREATININE 1.07 02/04/2015 0411      Component Value Date/Time   CALCIUM 9.3 07/07/2015 1038   CALCIUM 8.9 02/04/2015 0411   ALKPHOS 95 07/07/2015 1038   ALKPHOS 80 01/27/2015 1320   AST 29 07/07/2015 1038   AST 68* 01/27/2015 1320   ALT 15 07/07/2015 1038   ALT 18 01/27/2015 1320   BILITOT 0.65 07/07/2015 1038   BILITOT 1.2 01/27/2015 1320     ASSESSMENT & PLAN:  [name] So far, she tolerated Jakafi well apart from some abdominal bloating. The total white blood cell count and platelet count improving. I will bring the patient back on a weekly basis for CBC monitoring and I will see her back a month from now for further evaluation. She will continue same dose treatment without dose adjustment  Essential hypertension She is on multiple different medication for this. She does feel anxious every time she comes to this clinic. I recommend aggressive medical management by primary care doctor and cardiologist.   Abdominal bloating She has intermittent abdominal bloating which she attributed that to some side effects of treatment. She denies nausea, diarrhea or change in bowel habits. I recommend we continue the same treatment without dose adjustment   Orders Placed This Encounter  Procedures  . Comprehensive metabolic panel    Standing Status: Future     Number of Occurrences:      Standing Expiration Date: 08/10/2016   All questions were answered. The patient knows to call the clinic  with any problems, questions or concerns. No barriers to learning was detected. I spent 15 minutes counseling the patient face to face. The total time spent in the appointment was 20 minutes and more than 50% was on counseling and review of test results     [facility],  [place], MD 07/07/2015 2:25 PM

## 2015-07-07 NOTE — Telephone Encounter (Signed)
Patient need refill of verio one touch lancets, and BD needles .69m 5 mm send to both pharmacy's   [id number] [place] [facility] [address] [phone])  [place] [phone]) [phone])

## 2015-07-09 ENCOUNTER — Telehealth: Payer: Self-pay | Admitting: Internal Medicine

## 2015-07-09 ENCOUNTER — Ambulatory Visit (HOSPITAL_COMMUNITY): Payer: Medicare Other

## 2015-07-09 NOTE — Telephone Encounter (Signed)
Please read message below and advise.

## 2015-07-09 NOTE — Telephone Encounter (Signed)
Called and lvm advising pt per Dr Arman Filter message below. Advised them to call back on Monday with sugar readings.

## 2015-07-09 NOTE — Telephone Encounter (Signed)
Great improvement in her sugars! Please continue to check them, but since they continued to improve, let's not change her insulin doses for now. Please let us know about her sugars again after the weekend.

## 2015-07-09 NOTE — Telephone Encounter (Signed)
Patients husband called to call in blood sugars   9.14.16 Before breakfast 266 After dinner 284  9.15.16 Before breakfast 179 Before dinner 186  9.16.16 Before breakfast 214 Before dinner 131  9.17.16 Before breakfast 151 After lunch 263 Before dinner 167  9.18.16 Before breakfast 146 Before dinner 111  9.19.16  Before breakfast 135 Before dinner 161  9.20.16 Before breakfast 154   Thank  you

## 2015-07-11 ENCOUNTER — Ambulatory Visit (HOSPITAL_COMMUNITY): Payer: Medicare Other

## 2015-07-15 ENCOUNTER — Other Ambulatory Visit (HOSPITAL_BASED_OUTPATIENT_CLINIC_OR_DEPARTMENT_OTHER): Payer: Medicare Other

## 2015-07-15 ENCOUNTER — Telehealth: Payer: Self-pay | Admitting: *Deleted

## 2015-07-15 DIAGNOSIS — D45 Polycythemia vera: Secondary | ICD-10-CM | POA: Diagnosis not present

## 2015-07-15 LAB — CBC WITH DIFFERENTIAL/PLATELET
BASO%: 0.9 % (ref 0.0–2.0)
BASOS ABS: 0.1 10*3/uL (ref 0.0–0.1)
EOS ABS: 0.1 10*3/uL (ref 0.0–0.5)
EOS%: 1 % (ref 0.0–7.0)
HEMATOCRIT: 38.4 % (ref 34.8–46.6)
HEMOGLOBIN: 12.4 g/dL (ref 11.6–15.9)
LYMPH#: 1.9 10*3/uL (ref 0.9–3.3)
LYMPH%: 15.2 % (ref 14.0–49.7)
MCH: 30.5 pg (ref 25.1–34.0)
MCHC: 32.4 g/dL (ref 31.5–36.0)
MCV: 94.2 fL (ref 79.5–101.0)
MONO#: 0.6 10*3/uL (ref 0.1–0.9)
MONO%: 4.9 % (ref 0.0–14.0)
NEUT#: 9.5 10*3/uL — ABNORMAL HIGH (ref 1.5–6.5)
NEUT%: 78 % — ABNORMAL HIGH (ref 38.4–76.8)
Platelets: 415 10*3/uL — ABNORMAL HIGH (ref 145–400)
RBC: 4.08 10*6/uL (ref 3.70–5.45)
RDW: 16.4 % — AB (ref 11.2–14.5)
WBC: 12.2 10*3/uL — ABNORMAL HIGH (ref 3.9–10.3)

## 2015-07-15 LAB — TECHNOLOGIST REVIEW

## 2015-07-15 NOTE — Telephone Encounter (Signed)
Informed pt of lab results improving,  Keep taking same dose of medication as prescribed by Dr. Alvy Bimler and keep lab appt next week as scheduled.  She verbalized understanding.

## 2015-07-15 NOTE — Telephone Encounter (Signed)
-----   Message from [name], MD sent at 07/15/2015  8:25 AM EDT ----- Regarding: cbc Results are great Continue the same ----- Message -----    From: [facility] Interface    Sent: 07/15/2015   8:17 AM      To: [name], MD

## 2015-07-21 ENCOUNTER — Ambulatory Visit: Payer: Medicare Other | Admitting: Gastroenterology

## 2015-07-22 ENCOUNTER — Telehealth: Payer: Self-pay | Admitting: *Deleted

## 2015-07-22 ENCOUNTER — Other Ambulatory Visit (HOSPITAL_BASED_OUTPATIENT_CLINIC_OR_DEPARTMENT_OTHER): Payer: Medicare Other

## 2015-07-22 DIAGNOSIS — D45 Polycythemia vera: Secondary | ICD-10-CM

## 2015-07-22 LAB — COMPREHENSIVE METABOLIC PANEL (CC13)
ALT: 19 U/L (ref 0–55)
ANION GAP: 8 meq/L (ref 3–11)
AST: 39 U/L — ABNORMAL HIGH (ref 5–34)
Albumin: 3.8 g/dL (ref 3.5–5.0)
Alkaline Phosphatase: 121 U/L (ref 40–150)
BILIRUBIN TOTAL: 0.59 mg/dL (ref 0.20–1.20)
BUN: 19.7 mg/dL (ref 7.0–26.0)
CO2: 21 meq/L — AB (ref 22–29)
CREATININE: 1 mg/dL (ref 0.6–1.1)
Calcium: 9 mg/dL (ref 8.4–10.4)
Chloride: 114 mEq/L — ABNORMAL HIGH (ref 98–109)
EGFR: 57 mL/min/{1.73_m2} — ABNORMAL LOW (ref >90)
GLUCOSE: 146 mg/dL — AB (ref 70–140)
Potassium: 4.3 mEq/L (ref 3.5–5.1)
Sodium: 143 mEq/L (ref 136–145)
TOTAL PROTEIN: 6.4 g/dL (ref 6.4–8.3)

## 2015-07-22 LAB — CBC WITH DIFFERENTIAL/PLATELET
BASO%: 0.5 % (ref 0.0–2.0)
Basophils Absolute: 0.1 10*3/uL (ref 0.0–0.1)
EOS%: 1.8 % (ref 0.0–7.0)
Eosinophils Absolute: 0.2 10*3/uL (ref 0.0–0.5)
HCT: 36.7 % (ref 34.8–46.6)
HGB: 12 g/dL (ref 11.6–15.9)
LYMPH#: 1.9 10*3/uL (ref 0.9–3.3)
LYMPH%: 20.2 % (ref 14.0–49.7)
MCH: 30.6 pg (ref 25.1–34.0)
MCHC: 32.8 g/dL (ref 31.5–36.0)
MCV: 93.3 fL (ref 79.5–101.0)
MONO#: 0.4 10*3/uL (ref 0.1–0.9)
MONO%: 4.5 % (ref 0.0–14.0)
NEUT%: 73 % (ref 38.4–76.8)
NEUTROS ABS: 7.1 10*3/uL — AB (ref 1.5–6.5)
PLATELETS: 330 10*3/uL (ref 145–400)
RBC: 3.93 10*6/uL (ref 3.70–5.45)
RDW: 16.2 % — AB (ref 11.2–14.5)
WBC: 9.7 10*3/uL (ref 3.9–10.3)

## 2015-07-22 LAB — TECHNOLOGIST REVIEW

## 2015-07-22 NOTE — Telephone Encounter (Signed)
-----   Message from [name], MD sent at 07/22/2015  9:05 AM EDT ----- Regarding: cbc PLs review CBC with patient Results are now completely normal Recommend same dose Shanon Brow ----- Message -----    From: [facility]    Sent: 07/22/2015   8:59 AM      To: [name], MD

## 2015-07-22 NOTE — Telephone Encounter (Signed)
Left message with note below

## 2015-07-23 ENCOUNTER — Encounter: Payer: Self-pay | Admitting: Hematology and Oncology

## 2015-07-23 NOTE — Progress Notes (Signed)
Per [facility] Shanon Brow was shipped via [employer]

## 2015-07-29 ENCOUNTER — Telehealth: Payer: Self-pay | Admitting: *Deleted

## 2015-07-29 ENCOUNTER — Other Ambulatory Visit (HOSPITAL_BASED_OUTPATIENT_CLINIC_OR_DEPARTMENT_OTHER): Payer: Medicare Other

## 2015-07-29 DIAGNOSIS — D45 Polycythemia vera: Secondary | ICD-10-CM

## 2015-07-29 LAB — CBC WITH DIFFERENTIAL/PLATELET
BASO%: 0.4 % (ref 0.0–2.0)
Basophils Absolute: 0 10*3/uL (ref 0.0–0.1)
EOS ABS: 0.2 10*3/uL (ref 0.0–0.5)
EOS%: 1.8 % (ref 0.0–7.0)
HEMATOCRIT: 35.2 % (ref 34.8–46.6)
HGB: 11.7 g/dL (ref 11.6–15.9)
LYMPH%: 21.8 % (ref 14.0–49.7)
MCH: 30.5 pg (ref 25.1–34.0)
MCHC: 33.2 g/dL (ref 31.5–36.0)
MCV: 91.9 fL (ref 79.5–101.0)
MONO#: 0.7 10*3/uL (ref 0.1–0.9)
MONO%: 6.3 % (ref 0.0–14.0)
NEUT%: 69.7 % (ref 38.4–76.8)
NEUTROS ABS: 7.4 10*3/uL — AB (ref 1.5–6.5)
PLATELETS: 329 10*3/uL (ref 145–400)
RBC: 3.83 10*6/uL (ref 3.70–5.45)
RDW: 16.2 % — ABNORMAL HIGH (ref 11.2–14.5)
WBC: 10.5 10*3/uL — AB (ref 3.9–10.3)
lymph#: 2.3 10*3/uL (ref 0.9–3.3)
nRBC: 0 % (ref 0–0)

## 2015-07-29 LAB — TECHNOLOGIST REVIEW

## 2015-07-29 NOTE — Telephone Encounter (Signed)
-----   Message from [name], MD sent at 07/29/2015  8:14 AM EDT ----- Regarding: cbc Cbc looks great. Continue the same ----- Message -----    From: [facility]    Sent: 07/29/2015   8:07 AM      To: [name], MD

## 2015-07-29 NOTE — Telephone Encounter (Signed)
Instructed pt on labs are good and continue same dose Jakafi twice daily as ordered.  She verbalized understanding.

## 2015-07-31 ENCOUNTER — Telehealth: Payer: Self-pay | Admitting: Hematology and Oncology

## 2015-07-31 NOTE — Telephone Encounter (Signed)
s.w pt and r/s appt form 10.18 to 10.25 per MD in meeting.Marland KitchenMarland KitchenMarland KitchenMarland Kitchenpt ok and aware

## 2015-08-05 ENCOUNTER — Other Ambulatory Visit: Payer: Medicare Other

## 2015-08-05 ENCOUNTER — Ambulatory Visit: Payer: Medicare Other | Admitting: Hematology and Oncology

## 2015-08-11 ENCOUNTER — Encounter: Payer: Self-pay | Admitting: Cardiovascular Disease

## 2015-08-11 ENCOUNTER — Ambulatory Visit (INDEPENDENT_AMBULATORY_CARE_PROVIDER_SITE_OTHER): Payer: Medicare Other | Admitting: Cardiovascular Disease

## 2015-08-11 VITALS — BP 142/70 | HR 80 | Ht 64.5 in | Wt 222.5 lb

## 2015-08-11 DIAGNOSIS — E785 Hyperlipidemia, unspecified: Secondary | ICD-10-CM | POA: Diagnosis not present

## 2015-08-11 DIAGNOSIS — I2510 Atherosclerotic heart disease of native coronary artery without angina pectoris: Secondary | ICD-10-CM | POA: Diagnosis not present

## 2015-08-11 DIAGNOSIS — IMO0002 Reserved for concepts with insufficient information to code with codable children: Secondary | ICD-10-CM

## 2015-08-11 DIAGNOSIS — I10 Essential (primary) hypertension: Secondary | ICD-10-CM | POA: Diagnosis not present

## 2015-08-11 DIAGNOSIS — E669 Obesity, unspecified: Secondary | ICD-10-CM

## 2015-08-11 DIAGNOSIS — E1149 Type 2 diabetes mellitus with other diabetic neurological complication: Secondary | ICD-10-CM

## 2015-08-11 DIAGNOSIS — I2121 ST elevation (STEMI) myocardial infarction involving left circumflex coronary artery: Secondary | ICD-10-CM

## 2015-08-11 DIAGNOSIS — E1165 Type 2 diabetes mellitus with hyperglycemia: Secondary | ICD-10-CM

## 2015-08-11 DIAGNOSIS — Z794 Long term (current) use of insulin: Secondary | ICD-10-CM

## 2015-08-11 DIAGNOSIS — D45 Polycythemia vera: Secondary | ICD-10-CM

## 2015-08-11 DIAGNOSIS — D751 Secondary polycythemia: Secondary | ICD-10-CM

## 2015-08-11 NOTE — Patient Instructions (Signed)
Your physician wants you to follow-up in: 6 months with Dr. Dow Adolph will receive a reminder letter in the mail two months in advance. If you don't receive a letter, please call our office to schedule the follow-up appointment.  If you need a refill on your cardiac medications before your next appointment, please call your pharmacy.

## 2015-08-12 ENCOUNTER — Telehealth: Payer: Self-pay | Admitting: Hematology and Oncology

## 2015-08-12 ENCOUNTER — Other Ambulatory Visit (HOSPITAL_BASED_OUTPATIENT_CLINIC_OR_DEPARTMENT_OTHER): Payer: Medicare Other

## 2015-08-12 ENCOUNTER — Ambulatory Visit (HOSPITAL_BASED_OUTPATIENT_CLINIC_OR_DEPARTMENT_OTHER): Payer: Medicare Other | Admitting: Hematology and Oncology

## 2015-08-12 ENCOUNTER — Encounter: Payer: Self-pay | Admitting: Hematology and Oncology

## 2015-08-12 ENCOUNTER — Telehealth: Payer: Self-pay | Admitting: Internal Medicine

## 2015-08-12 VITALS — BP 134/42 | HR 60 | Temp 97.6°F | Resp 18 | Ht 64.5 in | Wt 223.1 lb

## 2015-08-12 DIAGNOSIS — R635 Abnormal weight gain: Secondary | ICD-10-CM

## 2015-08-12 DIAGNOSIS — K580 Irritable bowel syndrome with diarrhea: Secondary | ICD-10-CM

## 2015-08-12 DIAGNOSIS — D45 Polycythemia vera: Secondary | ICD-10-CM | POA: Diagnosis not present

## 2015-08-12 DIAGNOSIS — K589 Irritable bowel syndrome without diarrhea: Secondary | ICD-10-CM | POA: Insufficient documentation

## 2015-08-12 LAB — CBC WITH DIFFERENTIAL/PLATELET
BASO%: 0.4 % (ref 0.0–2.0)
BASOS ABS: 0.1 10*3/uL (ref 0.0–0.1)
EOS ABS: 0.2 10*3/uL (ref 0.0–0.5)
EOS%: 1.5 % (ref 0.0–7.0)
HEMATOCRIT: 34.4 % — AB (ref 34.8–46.6)
HEMOGLOBIN: 11.3 g/dL — AB (ref 11.6–15.9)
LYMPH#: 2.8 10*3/uL (ref 0.9–3.3)
LYMPH%: 22.7 % (ref 14.0–49.7)
MCH: 30.5 pg (ref 25.1–34.0)
MCHC: 32.8 g/dL (ref 31.5–36.0)
MCV: 92.7 fL (ref 79.5–101.0)
MONO#: 0.8 10*3/uL (ref 0.1–0.9)
MONO%: 6.7 % (ref 0.0–14.0)
NEUT#: 8.5 10*3/uL — ABNORMAL HIGH (ref 1.5–6.5)
NEUT%: 68.7 % (ref 38.4–76.8)
NRBC: 0 % (ref 0–0)
PLATELETS: 430 10*3/uL — AB (ref 145–400)
RBC: 3.71 10*6/uL (ref 3.70–5.45)
RDW: 16.5 % — AB (ref 11.2–14.5)
WBC: 12.3 10*3/uL — ABNORMAL HIGH (ref 3.9–10.3)

## 2015-08-12 LAB — TECHNOLOGIST REVIEW

## 2015-08-12 NOTE — Progress Notes (Signed)
[facility] OFFICE PROGRESS NOTE  Patient Care Team: Leamon Arnt, MD as PCP - General (Family Medicine) [name], MD as Attending Physician (Endocrinology) [name], MD as Consulting Physician (Hematology and Oncology) [name], MD as Consulting Physician (Internal Medicine) [name], MD as Consulting Physician (Cardiology)  SUMMARY OF ONCOLOGIC HISTORY:   [name] ([place])   05/31/2013 Initial Diagnosis [name]   06/27/2015 -  Chemotherapy She started Jakafi    INTERVAL HISTORY: Please see below for problem oriented charting. She is seen for further follow-up. She complained of some symptoms of abdominal bloating, passage of gas and diarrhea. She has also given some weight. She felt deconditioned but has started to exercise again. Her blood pressure and blood sugar has been excellent controlled over the past few weeks.  REVIEW OF SYSTEMS:   Constitutional: Denies fevers, chills or abnormal weight loss Eyes: Denies blurriness of vision Ears, nose, mouth, throat, and face: Denies mucositis or sore throat Respiratory: Denies cough, dyspnea or wheezes Cardiovascular: Denies palpitation, chest discomfort or lower extremity swelling Skin: Denies abnormal skin rashes Lymphatics: Denies new lymphadenopathy or easy bruising Neurological:Denies numbness, tingling or new weaknesses Behavioral/Psych: Mood is stable, no new changes  All other systems were reviewed with the patient and are negative.  I have reviewed the past medical history, past surgical history, social history and family history with the patient and they are unchanged from previous note.  ALLERGIES:  is allergic to lisinopril; tape; doxycycline; and latex.  MEDICATIONS:  Current Outpatient Prescriptions  Medication Sig Dispense Refill  . ALPRAZolam (XANAX) 0.25 MG tablet Take 1 tablet (0.25 mg total) by mouth 3 (three) times daily as needed for anxiety. 20 tablet  0  . aspirin EC 81 MG tablet Take 81 mg by mouth daily.    . B-D INS SYR ULTRAFINE 1CC/31G 31G X 5/16" 1 ML MISC     . dicyclomine (BENTYL) 10 MG capsule Take 1 tab by mouth every morning, may take twice daily as needed. 60 capsule 8  . diphenhydrAMINE (BENADRYL) 25 mg capsule Take 25 mg by mouth every 6 (six) hours as needed.    Marland Kitchen glucose blood (ONETOUCH VERIO) test strip Use to test blood sugar 2 times daily as instructed. Dx: E11.41 200 each 3  . hydrochlorothiazide (MICROZIDE) 12.5 MG capsule Take 1 capsule (12.5 mg total) by mouth daily. 90 capsule 3  . hydrocortisone 2.5 % cream Apply 1 application topically 2 (two) times daily.    . Insulin Pen Needle (CAREFINE PEN NEEDLES) 32G X 5 MM MISC Use to inject insulin 2 times daily as instructed. 190 each 1  . insulin regular human CONCENTRATED (HUMULIN R U-500 KWIKPEN) 500 UNIT/ML injection Inject 120 units under the skin before breakfast and 100 units before dinner. 18 pen 1  . insulin regular human CONCENTRATED (HUMULIN R) 500 UNIT/ML injection Inject under skin 66 units before breakfast and dinner 60 mL 1  . insulin regular human CONCENTRATED 500 UNIT/ML SOPN Inject 75 Units into the skin 2 (two) times daily before a meal. 12 pen 1  . loperamide (IMODIUM A-D) 2 MG tablet Take 2 mg by mouth daily.    Marland Kitchen losartan (COZAAR) 100 MG tablet Take 1 tablet (100 mg total) by mouth daily. 90 tablet 3  . metoprolol (LOPRESSOR) 100 MG tablet Take 1 tablet (100 mg total) by mouth 2 (two) times daily. 180 tablet 3  . nitroGLYCERIN (NITROSTAT) 0.4 MG SL tablet Place 1 tablet (0.4  mg total) under the tongue every 5 (five) minutes as needed for chest pain. 25 tablet 3  . NONFORMULARY OR COMPOUNDED ITEM Neuropathy cream: mix Amitriptyline 2%, Lidocaine 2%, and Ketamine 1% - apply on feet 2x a day [facility]. 1 each 2  . nystatin cream (MYCOSTATIN) Apply 1 application topically 2 (two) times daily.    Marland Kitchen omega-3 acid ethyl esters (LOVAZA) 1 G capsule Take  1 capsule (1 g total) by mouth 2 (two) times daily. 60 capsule 11  . ondansetron (ZOFRAN) 8 MG tablet Take 8 mg by mouth every 8 (eight) hours as needed for nausea or vomiting.    Glory Rosebush DELICA LANCETS FINE MISC Use to test blood sugar 2 times daily as instructed. Dx: E11.41 200 each 1  . oxyCODONE-acetaminophen (PERCOCET/ROXICET) 5-325 MG per tablet Take 1 tablet by mouth every 4 (four) hours as needed for moderate pain. 20 tablet 0  . Probiotic Product (PROBIOTIC DAILY PO) Take by mouth daily.    . rosuvastatin (CRESTOR) 20 MG tablet Take 1 tablet (20 mg total) by mouth daily. 90 tablet 3  . ruxolitinib phosphate (JAKAFI) 20 MG tablet Take 1 tablet (20 mg total) by mouth 2 (two) times daily. 60 tablet 3  . ticagrelor (BRILINTA) 90 MG TABS tablet Take 1 tablet (90 mg total) by mouth 2 (two) times daily. 60 tablet 5   No current facility-administered medications for this visit.    PHYSICAL EXAMINATION: ECOG PERFORMANCE STATUS: 1 - Symptomatic but completely ambulatory  Filed Vitals:   08/12/15 0832  BP: 134/42  Pulse: 60  Temp: 97.6 F (36.4 C)  Resp: 18   Filed Weights   08/12/15 0832  Weight: 223 lb 1.6 oz (101.197 kg)    GENERAL:alert, no distress and comfortable SKIN: skin color, texture, turgor are normal, no rashes or significant lesions EYES: normal, Conjunctiva are pink and non-injected, sclera clear Musculoskeletal:no cyanosis of digits and no clubbing  NEURO: alert & oriented x 3 with fluent speech, no focal motor/sensory deficits  LABORATORY DATA:  I have reviewed the data as listed    Component Value Date/Time   NA 143 07/22/2015 0844   NA 144 02/04/2015 0411   K 4.3 07/22/2015 0844   K 3.8 02/04/2015 0411   CL 104 02/04/2015 0411   CL 103 02/16/2013 0900   CO2 21* 07/22/2015 0844   CO2 30 02/04/2015 0411   GLUCOSE 146* 07/22/2015 0844   GLUCOSE 109* 02/04/2015 0411   GLUCOSE 396* 02/16/2013 0900   GLUCOSE 292 06/02/2010   BUN 19.7 07/22/2015 0844    BUN 37* 02/04/2015 0411   CREATININE 1.0 07/22/2015 0844   CREATININE 1.07 02/04/2015 0411   CALCIUM 9.0 07/22/2015 0844   CALCIUM 8.9 02/04/2015 0411   PROT 6.4 07/22/2015 0844   PROT 6.2 01/27/2015 1320   ALBUMIN 3.8 07/22/2015 0844   ALBUMIN 3.5 01/27/2015 1320   AST 39* 07/22/2015 0844   AST 68* 01/27/2015 1320   ALT 19 07/22/2015 0844   ALT 18 01/27/2015 1320   ALKPHOS 121 07/22/2015 0844   ALKPHOS 80 01/27/2015 1320   BILITOT 0.59 07/22/2015 0844   BILITOT 1.2 01/27/2015 1320   GFRNONAA 49* 02/04/2015 0411   GFRAA 57* 02/04/2015 0411    No results found for: SPEP, UPEP  Lab Results  Component Value Date   WBC 12.3* 08/12/2015   NEUTROABS 8.5* 08/12/2015   HGB 11.3* 08/12/2015   HCT 34.4* 08/12/2015   MCV 92.7 08/12/2015   PLT  430* 08/12/2015      Chemistry      Component Value Date/Time   NA 143 07/22/2015 0844   NA 144 02/04/2015 0411   K 4.3 07/22/2015 0844   K 3.8 02/04/2015 0411   CL 104 02/04/2015 0411   CL 103 02/16/2013 0900   CO2 21* 07/22/2015 0844   CO2 30 02/04/2015 0411   BUN 19.7 07/22/2015 0844   BUN 37* 02/04/2015 0411   CREATININE 1.0 07/22/2015 0844   CREATININE 1.07 02/04/2015 0411      Component Value Date/Time   CALCIUM 9.0 07/22/2015 0844   CALCIUM 8.9 02/04/2015 0411   ALKPHOS 121 07/22/2015 0844   ALKPHOS 80 01/27/2015 1320   AST 39* 07/22/2015 0844   AST 68* 01/27/2015 1320   ALT 19 07/22/2015 0844   ALT 18 01/27/2015 1320   BILITOT 0.59 07/22/2015 0844   BILITOT 1.2 01/27/2015 1320      ASSESSMENT & PLAN:  [name] She tolerated treatment very well. Some of the complaints of bloating, passage of gas and diarrhea are unlikely due to side effects of treatment. I will continue same dose without dose adjustment.  It appears that by controlling her polycythemia, her blood pressure is better controlled along with control of her diabetes. I will see her back a month from now for further assessment.  WEIGHT  GAIN She have extra fluid retention. When I review her medication list, she was prescribed HCTZ but has not been compliant taking it. I recommend she resume HCTZ daily for target goal weight of 215 pounds. I will reassess next month  Irritable bowel syndrome She has complaints of irritable bowel syndrome for some time. She has stopped taking Bentyl and have relapse of symptoms of abdominal bloating, passage of gas and diarrhea. I do not believe this is a side effects of Jakafi. I recommend she resume Bentyl   Orders Placed This Encounter  Procedures  . CBC with Differential/Platelet    Standing Status: Future     Number of Occurrences:      Standing Expiration Date: 09/15/2016  . Comprehensive metabolic panel    Standing Status: Future     Number of Occurrences:      Standing Expiration Date: 09/15/2016   All questions were answered. The patient knows to call the clinic with any problems, questions or concerns. No barriers to learning was detected. I spent 15 minutes counseling the patient face to face. The total time spent in the appointment was 20 minutes and more than 50% was on counseling and review of test results     [facility], [place], MD 08/12/2015 9:49 AM

## 2015-08-12 NOTE — Telephone Encounter (Signed)
per pof to sch pt appt-gave pt copy of avs °

## 2015-08-12 NOTE — Assessment & Plan Note (Signed)
She have extra fluid retention. When I review her medication list, she was prescribed HCTZ but has not been compliant taking it. I recommend she resume HCTZ daily for target goal weight of 215 pounds. I will reassess next month

## 2015-08-12 NOTE — Assessment & Plan Note (Signed)
She has complaints of irritable bowel syndrome for some time. She has stopped taking Bentyl and have relapse of symptoms of abdominal bloating, passage of gas and diarrhea. I do not believe this is a side effects of Jakafi. I recommend she resume Bentyl

## 2015-08-12 NOTE — Telephone Encounter (Signed)
Faxed form to [facility] and advised them pt needs foot exam first. Called pt and advised her that Dr Cruzita Lederer would have to do a foot exam tomorrow to complete form. Pt voiced understanding.

## 2015-08-12 NOTE — Telephone Encounter (Signed)
Helane Gunther calling from [facility] calling for status of the diabetic shoe form # [phone] f [phone] only needs a call back if you have questions

## 2015-08-12 NOTE — Assessment & Plan Note (Signed)
She tolerated treatment very well. Some of the complaints of bloating, passage of gas and diarrhea are unlikely due to side effects of treatment. I will continue same dose without dose adjustment.  It appears that by controlling her polycythemia, her blood pressure is better controlled along with control of her diabetes. I will see her back a month from now for further assessment.

## 2015-08-13 ENCOUNTER — Ambulatory Visit (INDEPENDENT_AMBULATORY_CARE_PROVIDER_SITE_OTHER): Payer: Medicare Other | Admitting: Internal Medicine

## 2015-08-13 ENCOUNTER — Encounter: Payer: Self-pay | Admitting: Internal Medicine

## 2015-08-13 VITALS — BP 122/74 | HR 78 | Temp 97.6°F | Resp 12 | Wt 221.0 lb

## 2015-08-13 DIAGNOSIS — E1142 Type 2 diabetes mellitus with diabetic polyneuropathy: Secondary | ICD-10-CM | POA: Diagnosis not present

## 2015-08-13 DIAGNOSIS — E1149 Type 2 diabetes mellitus with other diabetic neurological complication: Secondary | ICD-10-CM | POA: Diagnosis not present

## 2015-08-13 DIAGNOSIS — IMO0002 Reserved for concepts with insufficient information to code with codable children: Secondary | ICD-10-CM

## 2015-08-13 DIAGNOSIS — E1165 Type 2 diabetes mellitus with hyperglycemia: Secondary | ICD-10-CM | POA: Diagnosis not present

## 2015-08-13 DIAGNOSIS — Z794 Long term (current) use of insulin: Secondary | ICD-10-CM

## 2015-08-13 NOTE — Patient Instructions (Signed)
Please change the U500 insulin doses: - 80 units before a smaller meal - 100 units before a larger meal or if you have dessert  Please return in 3 months with your sugar log.

## 2015-08-13 NOTE — Progress Notes (Signed)
Patient ID: Amanda [name], female   DOB: 03-23-1941, 74 y.o.   MRN: [id number]     HPI: Amanda [name] is a 74 y.o. female who presents to the office today for a 3 month follow-up cardiology evaluation.    Ms. Hand has a history of hypertension, type 2 diabetes mellitus, polycythemia, as well as peripheral neuropathy.  She developed new onset chest pain on 01/27/2015 and was found have 3 mm ST segment elevation by EMS.  During transport to the hospital she developed idioventricular rhythm which was treated with amiodarone bolus.  I took her acutely to the cardiac catheterization laboratory on 01/27/2015.  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  She underwent successful PCI intervention with  insertion of a  Xience Alpine 3.023 mm DES stent postdilated to 3.15 millimeters in the mid lesion and the distal stenosis was treated with a 2.518 mm Xience Alpine DES stent postdilated to 2.57 mm.  It was felt that her idioventricular rhythm was probably the result of spontaneous reperfusion in route to the hospital.  She initially was discharged after 3 days to [facility] rehabilitation which did she not find to be helpful and went home 3 days later with home health care coming to her house. During her hospitalization, an echo Doppler study revealed an EF of 50-55%.  She had abnormal tissue Doppler.  There is mitral annular calcification and moderate LA dilatation.  She had abnormal posterior lateral and septal strain.  Since I last saw her 3 months ago, she continues to have difficulty with bloating and diarrhea.  She denies any recurrent episodes of chest pain.  She admits to exertional shortness of breath.  She is not very active and does not ambulate well.  She does admit to having occasional paresthesias.  She has been  on metoprolol, tartrate 100 mg twice a day, losartan 100 mg daily, HCTZ 12.5 mg daily for her blood pressure and post MI treatment.  She has been treated with rosuvastatin 20 mg for hyperlipidemia.  She is diabetic on insulin.  She recently was started on Jakafi for polycythemia and notes significant improvement.  Past Medical History  Diagnosis Date  . Hypertension   . Hyperlipidemia   . Diabetes mellitus 2008  . Depression   . Obesity   . Psoriasis   . Polycythemia     Dr. Elease Hashimoto- [facility]  . Herpes simplex   . Gout   . Hyperglycemia 05/31/2013  . C. difficile colitis   . Cirrhosis ([place])   . Cancer ([place])     SKIN  . Neuromuscular disorder ([place])     BELL PALSY  . Candidiasis of skin 09/30/2014  . Myocardial infarction [facility])     Past Surgical History  Procedure Laterality Date  . Breast surgery Left     milk duct  . Total abdominal hysterectomy w/ bilateral salpingoophorectomy      for heavy periods with appendectomy  . Tonsillectomy    . Colonoscopy    . Left heart catheterization with coronary angiogram N/A 01/27/2015    Procedure: LEFT HEART CATHETERIZATION WITH CORONARY ANGIOGRAM;  Surgeon: [name], MD;  Location: [facility] CATH LAB;  Service: Cardiovascular;  Laterality: N/A;    Allergies  Allergen Reactions  . Lisinopril Cough  . Tape  Hives  . Doxycycline Hives, Swelling and Rash  . Latex Hives, Itching and Rash    Current Outpatient Prescriptions  Medication Sig Dispense Refill  . ALPRAZolam (XANAX) 0.25 MG tablet Take 1 tablet (0.25 mg total) by mouth 3 (three) times daily as needed for anxiety. 20 tablet 0  . aspirin EC 81 MG tablet Take 81 mg by mouth daily.    . B-D INS SYR ULTRAFINE 1CC/31G 31G X 5/16" 1 ML MISC     . dicyclomine (BENTYL) 10 MG capsule Take 1 tab by mouth every morning, may take twice daily as needed. 60 capsule 8  . diphenhydrAMINE (BENADRYL) 25 mg capsule Take 25 mg by mouth every 6 (six) hours as needed.    Marland Kitchen glucose blood  (ONETOUCH VERIO) test strip Use to test blood sugar 2 times daily as instructed. Dx: E11.41 200 each 3  . hydrochlorothiazide (MICROZIDE) 12.5 MG capsule Take 1 capsule (12.5 mg total) by mouth daily. 90 capsule 3  . hydrocortisone 2.5 % cream Apply 1 application topically 2 (two) times daily.    . Insulin Pen Needle (CAREFINE PEN NEEDLES) 32G X 5 MM MISC Use to inject insulin 2 times daily as instructed. 190 each 1  . insulin regular human CONCENTRATED (HUMULIN R U-500 KWIKPEN) 500 UNIT/ML injection Inject 120 units under the skin before breakfast and 100 units before dinner. 18 pen 1  . insulin regular human CONCENTRATED (HUMULIN R) 500 UNIT/ML injection Inject under skin 66 units before breakfast and dinner 60 mL 1  . insulin regular human CONCENTRATED 500 UNIT/ML SOPN Inject 75 Units into the skin 2 (two) times daily before a meal. 12 pen 1  . loperamide (IMODIUM A-D) 2 MG tablet Take 2 mg by mouth daily.    Marland Kitchen losartan (COZAAR) 100 MG tablet Take 1 tablet (100 mg total) by mouth daily. 90 tablet 3  . metoprolol (LOPRESSOR) 100 MG tablet Take 1 tablet (100 mg total) by mouth 2 (two) times daily. 180 tablet 3  . nitroGLYCERIN (NITROSTAT) 0.4 MG SL tablet Place 1 tablet (0.4 mg total) under the tongue every 5 (five) minutes as needed for chest pain. 25 tablet 3  . NONFORMULARY OR COMPOUNDED ITEM Neuropathy cream: mix Amitriptyline 2%, Lidocaine 2%, and Ketamine 1% - apply on feet 2x a day [facility]. 1 each 2  . nystatin cream (MYCOSTATIN) Apply 1 application topically 2 (two) times daily.    Marland Kitchen omega-3 acid ethyl esters (LOVAZA) 1 G capsule Take 1 capsule (1 g total) by mouth 2 (two) times daily. 60 capsule 11  . ondansetron (ZOFRAN) 8 MG tablet Take 8 mg by mouth every 8 (eight) hours as needed for nausea or vomiting.    Glory Rosebush DELICA LANCETS FINE MISC Use to test blood sugar 2 times daily as instructed. Dx: E11.41 200 each 1  . oxyCODONE-acetaminophen (PERCOCET/ROXICET) 5-325 MG per  tablet Take 1 tablet by mouth every 4 (four) hours as needed for moderate pain. 20 tablet 0  . Probiotic Product (PROBIOTIC DAILY PO) Take by mouth daily.    . rosuvastatin (CRESTOR) 20 MG tablet Take 1 tablet (20 mg total) by mouth daily. 90 tablet 3  . ruxolitinib phosphate (JAKAFI) 20 MG tablet Take 1 tablet (20 mg total) by mouth 2 (two) times daily. 60 tablet 3  . ticagrelor (BRILINTA) 90 MG TABS tablet Take 1 tablet (90 mg total) by mouth 2 (two) times daily. 60 tablet 5   No current facility-administered medications for this  visit.    Social History   Social History  . Marital Status: Married    Spouse Name: N/A  . Number of Children: 2  . Years of Education: N/A   Occupational History  . housewife    Social History Main Topics  . Smoking status: Former Smoker -- 48 years    Types: Cigarettes    Quit date: 10/18/1986  . Smokeless tobacco: Never Used  . Alcohol Use: No  . Drug Use: No  . Sexual Activity: Yes    Birth Control/ Protection: None   Other Topics Concern  . Not on file   Social History Narrative    Family History  Problem Relation Age of Onset  . Diabetes Mother   . Heart disease Mother     CAD  . Hyperlipidemia Mother   . Hypertension Mother   . Kidney disease Mother   . Kidney disease Father   . Breast cancer Maternal Aunt   . Heart disease Maternal Grandmother   . Heart disease Other     maternal aunts and uncles    ROS General: Negative; No fevers, chills, or night sweats HEENT: Negative; No changes in vision or hearing, sinus congestion, difficulty swallowing Pulmonary: Negative; No cough, wheezing, shortness of breath, hemoptysis Cardiovascular: See HPI: No chest pain, presyncope, syncope, palpatations GI: Positive for abdominal bloating and occasional to frequent diarrhea. GU: Negative; No dysuria, hematuria, or difficulty voiding Musculoskeletal: Negative; no myalgias, joint pain, or weakness Hematologic: Negative; no easy bruising,  bleeding Endocrine: Negative; no heat/cold intolerance; no diabetes, Neuro: Negative; no changes in balance, headaches Skin: Negative; No rashes or skin lesions Psychiatric: Negative; No behavioral problems, depression Sleep: Negative; No snoring,  daytime sleepiness, hypersomnolence, bruxism, restless legs, hypnogognic hallucinations. Other comprehensive 14 point system review is negative   Physical Exam BP 142/70 mmHg  Pulse 80  Ht 5' 4.5" (1.638 m)  Wt 222 lb 8 oz (100.925 kg)  BMI 37.62 kg/m2   Repeat blood pressure by me 130/78. Wt Readings from Last 3 Encounters:  08/13/15 221 lb (100.245 kg)  08/12/15 223 lb 1.6 oz (101.197 kg)  08/11/15 222 lb 8 oz (100.925 kg)   General: Alert, oriented, no distress.  Skin: normal turgor, no rashes, warm and dry HEENT: Normocephalic, atraumatic. Pupils equal round and reactive to light; sclera anicteric; extraocular muscles intact, No lid lag; Nose without nasal septal hypertrophy; Mouth/Parynx benign; Mallinpatti scale 3 Neck: No JVD, no carotid bruits; normal carotid upstroke Lungs: clear to ausculatation and percussion bilaterally; no wheezing or rales, normal inspiratory and expiratory effort Chest wall: without tenderness to palpitation Heart: PMI not displaced, RRR, s1 s2 normal, 1/6 systolic murmur, No diastolic murmur, no rubs, gallops, thrills, or heaves Abdomen: soft, nontender; no hepatosplenomehaly, BS+; abdominal aorta nontender and not dilated by palpation. Back: no CVA tenderness Pulses: 2+  Musculoskeletal: full range of motion, normal strength, no joint deformities Extremities: Pulses 2+, no clubbing cyanosis or edema, Homan's sign negative  Neurologic: grossly nonfocal; Cranial nerves grossly wnl Psychologic: Normal mood and affect  ECG (independently read by me): Normal sinus rhythm 80 bpm.  No ECG evidence for prior MI.  QTC interval 463 ms.  July 2016 ECG (independently read by me):  Normal sinus rhythm at 96 bpm.  No significant ST changes.  No ECG evidence of prior MI.  LABS:  BMP Latest Ref Rng 07/22/2015 07/07/2015 02/04/2015  Glucose 70 - 140 mg/dl 146(H) 123 109(H)  BUN 7.0 - 26.0 mg/dL 19.7 9.0 37(H)  Creatinine 0.6 - 1.1 mg/dL 1.0 0.9 1.07  Sodium 136 - 145 mEq/L 143 143 144  Potassium 3.5 - 5.1 mEq/L 4.3 4.3 3.8  Chloride 96 - 112 mmol/L - - 104  CO2 22 - 29 mEq/L 21(L) 26 30  Calcium 8.4 - 10.4 mg/dL 9.0 9.3 8.9    Hepatic Function Latest Ref Rng 07/22/2015 07/07/2015 01/27/2015  Total Protein 6.4 - 8.3 g/dL 6.4 6.6 6.2  Albumin 3.5 - 5.0 g/dL 3.8 4.0 3.5  AST 5 - 34 U/L 39(H) 29 68(H)  ALT 0 - 55 U/L _0 Alk Phosphatase 40 - 150 U/L 121 95 80  Total Bilirubin 0.20 - 1.20 mg/dL 0.59 0.65 1.2   CBC Latest Ref Rng 08/12/2015 07/29/2015 07/22/2015  WBC 3.9 - 10.3 10e3/uL 12.3(H) 10.5(H) 9.7  Hemoglobin 11.6 - 15.9 g/dL 11.3(L) 11.7 12.0  Hematocrit 34.8 - 46.6 % 34.4(L) 35.2 36.7  Platelets 145 - 400 10e3/uL 430(H) 329 330   Lab Results  Component Value Date   MCV 92.7 08/12/2015   MCV 91.9 07/29/2015   MCV 93.3 07/22/2015    Lab Results  Component Value Date   TSH 3.290 01/29/2014    BNP No results found for: BNP  ProBNP    Component Value Date/Time   PROBNP 311.6* 01/29/2014 1203     Lipid Panel     Component Value Date/Time   CHOL 168 01/27/2015 1320   TRIG 266* 01/27/2015 1320   HDL 27* 01/27/2015 1320   CHOLHDL 6.2 01/27/2015 1320   VLDL 53* 01/27/2015 1320   LDLCALC 88 01/27/2015 1320     RADIOLOGY: No results found.    ASSESSMENT AND PLAN: Ms Denene Alamillo is a 74 year old female who is originally from [place] who presented on 01/27/2015 with a left circumflex STEMI.  She most likely had some spontaneous reperfusion which led to transient to her idioventricular rhythm.  She underwent successful intervention.  Her echo Doppler study 2 days later showed an EF of 50-55% with fairly normal wall motion.  When I initially saw her 3 months ago  in the office .  She was not well beta blocked.  And I further titrated her beta blocker to metoprolol 100 mg twice a day.  Her blood pressure today is improved.  Her resting pulse is 80.  When she was hospitalized she was started on atorvastatin but this had been stopped secondary to questionable issues of nausea.  I instituted Crestor 20 mg and she seems to tolerate this well.  She does not have significant edema.  Presently, she is on low-dose diuretic with HCTZ 12.5 mg.  She continues to be on aspirin and  dual anti platelet therapy following her ST segment elevation MI.   I reviewed recent laboratory.  She will ultimately need to have a follow-up lipid panel, LFTs, with the initiation of Crestor.  She feels improved with the addition of Jakafi for polycythemia which is followed by Dr. Alvy Bimler.  She has an appointment to see Dr. [name] in follow-up of her diarrhea and abdominal bloating.  I will see her in 6 months for cardiology evaluation or sooner if problems arise.  Time spent: 25 minutes  [name], MD, [facility]  08/13/2015 1:16 PM

## 2015-08-13 NOTE — Progress Notes (Signed)
Patient ID: Amanda [name], female   DOB: 06-Nov-1940, 74 y.o.   MRN: [id number]  HPI: Amanda [name] is a 74 y.o.-year-old female, returning for f/u for DM2, dx 2008, insulin-dependent since dx, uncontrolled, with complications (DR, PN, CAD). Last visit 1.5 mo ago.  She was started on Jakafi >> sugars are much better.   Last hemoglobin A1c was: 07/01/2015: HbA1c from fructosamine 6.57%. 01/27/2015: HbA1c from fructosamine: 6.57% 05/16/2014: HbA1c from fructosamine: 7.95%. Lab Results  Component Value Date   HGBA1C 8.8* 01/27/2015   HGBA1C 9.8* 05/16/2014   HGBA1C 13.5* 01/31/2014  - in 10/16/2012, hemoglobin A1c was 6.9% - in 07/2012, hemoglobin A1c was 9.9% - in 11/16/2010, hemoglobin A1c was 12.8% - in 03/2011, hemoglobin A1c was 6.9% - In 07/10/2010 her hemoglobin A1c was 10.5% C-peptide was 6.3 and 01/2011.  Pt was on a regimen of: - Levemir 45 units 2x a day, in am and at bedtime - Humalog mealtime: 40 units with a smaller meal 45 units with a larger meal  - Sliding Scale of Humalog:  - 150- 165: + 1 unit  - 166- 180: + 2 units  - 181- 195: + 3 units  - 196- 210: + 4 units  - 211- 225: + 5 units  - 226- 240: + 6 units  - >240: + 7 units  She could not tolerate doses of metformin XR higher than 250 mg twice a day in the past due to diarrhea. She does not want to resume this. Was previously on the Victoza 1.8 mg daily - "made me sick". She was previously on Janumet between 2012-2013. She was on Invokana - but could not afford it while in the Medicare doughnut hole.  We switched to U500: - 15 units - on the syringe (0.15 mL) before breakfast >> 20 units >> pens (100 units) - 18 units - on the syringe (0.18 mL) before dinner >> 24 units >> pens (80 units)  She checks sugars 2x a day - better: - am: 190-255 >> 209-347 >> same >> 90-211, 235 >> 96, 128-206, 291 - 2h after b'fast: n/c >> 238 >> n/c - before lunch: 140, 223, 247 >> 161-319 >> 258-290 >> 167-343 >> 175-340 >>  238 >> n/c - 2h after lunch: 240-312 >> n/c >> 270-456 >> 112-218 >> n/c - before dinner: 150-330 >> 150-170 >> 90 x1, 315 >> 175-336 >> 147, 273 >> 58, 66, 89-157, 203, 224 (318 - Holiday) - bedtime: 152-306 >> n/c >> 161-326 >> 164, 240, 240, 268 >> 273 No lows. Lowest sugar was 90x1 >> 70x1 >> 70-80s at 4 pm >> 58; she has hypoglycemia awareness at 100.   Pt's meals are - reviewed per last visit: - Breakfast: ("I am not a breakfast eater") 1 egg + toast; 3 pancakes + sugarfree syrup; waffle; french toast - not skipping anymore  - Lunch: sometimes skips, sometimes eats out: pizza, salad, no dessert - Dinner: at 5:30-6pm: soup, meat + vegetables + starch, dessert - Snacks: 8 pm: fruit, sugar free  She was seen for nutritional advice and diabetes education, with last visit on 04/2012 - [name].  - no CKD, last BUN/creatinine:  Lab Results  Component Value Date   BUN 19.7 07/22/2015   CREATININE 1.0 07/22/2015  She is on ACEI.  - last set of lipids: Lab Results  Component Value Date   CHOL 168 01/27/2015   HDL 27* 01/27/2015   LDLCALC 88 01/27/2015   TRIG 266* 01/27/2015  CHOLHDL 6.2 01/27/2015  She is on Pravastatin. - last eye exam was in 07/2013. 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 At last eye exam (Dr Zigmund Daniel): cataracts. - no numbness and tingling in her feet. For her peripheral neuropathy >> saw PCP, podiatrist, orthopedic >> tried capsaicin cream, compression stockings, then Neurontin >> on 600 mg 3x a day. She is on aspirin 81 mg daily.  She also has a history of hypertension- on HCTZ, Metoprolol, also diastolic dysfunction, hyperlipidemia, polycytemia vera, gout, psoriasis, depression, history of Bell's palsy, obesity, vitamin D deficiency.  On 01/27/2015 she had an AMI >> had 2 stents placed. She had very erratic sugars in the hospital.  She recently changed the PVera tx. Starts Jakafi.  ROS: Constitutional: + weight gain, no fatigue Eyes: no  blurry vision, no xerophthalmia ENT: no sore throat, no nodules palpated in throat, no dysphagia/odynophagia, no hoarseness Cardiovascular: no CP/SOB/palpitations/leg swelling Respiratory:no  cough/no SOB Gastrointestinal: no N/V/+ D/no C Musculoskeletal: no muscle/joint aches Skin: No rashes Neurological: no tremors/numbness/tingling/dizziness  I reviewed pt's medications, allergies, PMH, social hx, family hx, and changes were documented in the history of present illness. Otherwise, unchanged from my initial visit note.  PE: BP 122/74 mmHg  Pulse 78  Temp(Src) 97.6 F (36.4 C) (Oral)  Resp 12  Wt 221 lb (100.245 kg)  SpO2 94% Body mass index is 37.36 kg/(m^2). Wt Readings from Last 3 Encounters:  08/13/15 221 lb (100.245 kg)  08/12/15 223 lb 1.6 oz (101.197 kg)  08/11/15 222 lb 8 oz (100.925 kg)   Constitutional: overweight, in NAD Eyes: PERRLA, EOMI, no exophthalmos ENT: moist mucous membranes, no thyromegaly, no cervical lymphadenopathy Cardiovascular: tachycardia RR, No MRG, no leg swelling Respiratory: CTA B Gastrointestinal: abdomen soft, NT, ND, BS+ Musculoskeletal: no deformities, strength intact in all 4 Skin: moist, warm  ASSESSMENT: 1. 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 Diabetic peripheral neuropathy  PLAN:  1. Patient with long-standing, uncontrolled diabetes, doing better on U500, but has fluctuating sugars >> will give her a more flexible regimen: - I advised her to: Patient Instructions  Please change the U500 insulin doses: - 80 units before a smaller meal - 100 units before a larger meal or if you have dessert  Please return in 3 months with your sugar log.   - check 3 times a day, rotating checks - needs a new eye exam (Dr. Zigmund Daniel)  >> again advised to schedule!!! - reviewed last Fructosamine - The HbA1c calculated from the fructosamine was same as before, 6.57%. - will  gave her the flu vaccine at last visit - Return to clinic in 3 months with sugar log  2. Diabetic peripheral neuropathy - on Neurontin to 600 mg 3 times a day - on neuropathy cream: mix Amitriptyline 2%, Lidocaine 2%, and Ketamine 1% - apply on feet 2x a day - better after her stents - filled out a comprehensive foot exam form for her DM shoes

## 2015-08-27 ENCOUNTER — Encounter: Payer: Self-pay | Admitting: Hematology and Oncology

## 2015-08-27 NOTE — Progress Notes (Signed)
Per [employer]ologics Amanda [name] was shipped via [employer]

## 2015-08-29 DIAGNOSIS — E1141 Type 2 diabetes mellitus with diabetic mononeuropathy: Secondary | ICD-10-CM | POA: Diagnosis not present

## 2015-09-15 ENCOUNTER — Ambulatory Visit (HOSPITAL_BASED_OUTPATIENT_CLINIC_OR_DEPARTMENT_OTHER): Payer: Medicare Other

## 2015-09-15 ENCOUNTER — Telehealth: Payer: Self-pay | Admitting: *Deleted

## 2015-09-15 ENCOUNTER — Ambulatory Visit (HOSPITAL_BASED_OUTPATIENT_CLINIC_OR_DEPARTMENT_OTHER): Payer: Medicare Other | Admitting: Hematology and Oncology

## 2015-09-15 ENCOUNTER — Other Ambulatory Visit (HOSPITAL_BASED_OUTPATIENT_CLINIC_OR_DEPARTMENT_OTHER): Payer: Medicare Other

## 2015-09-15 ENCOUNTER — Other Ambulatory Visit: Payer: Self-pay | Admitting: *Deleted

## 2015-09-15 ENCOUNTER — Encounter: Payer: Self-pay | Admitting: Hematology and Oncology

## 2015-09-15 ENCOUNTER — Telehealth: Payer: Self-pay | Admitting: Hematology and Oncology

## 2015-09-15 VITALS — BP 186/85 | HR 87 | Temp 97.9°F | Resp 18 | Ht 64.5 in | Wt 228.7 lb

## 2015-09-15 DIAGNOSIS — D45 Polycythemia vera: Secondary | ICD-10-CM

## 2015-09-15 DIAGNOSIS — I10 Essential (primary) hypertension: Secondary | ICD-10-CM | POA: Diagnosis not present

## 2015-09-15 DIAGNOSIS — D75839 Thrombocytosis, unspecified: Secondary | ICD-10-CM

## 2015-09-15 DIAGNOSIS — R635 Abnormal weight gain: Secondary | ICD-10-CM

## 2015-09-15 DIAGNOSIS — D473 Essential (hemorrhagic) thrombocythemia: Secondary | ICD-10-CM

## 2015-09-15 LAB — T4, FREE: FREE T4: 0.98 ng/dL (ref 0.80–1.80)

## 2015-09-15 LAB — CBC WITH DIFFERENTIAL/PLATELET
BASO%: 0.3 % (ref 0.0–2.0)
BASOS ABS: 0 10*3/uL (ref 0.0–0.1)
EOS ABS: 0.3 10*3/uL (ref 0.0–0.5)
EOS%: 2.5 % (ref 0.0–7.0)
HEMATOCRIT: 34 % — AB (ref 34.8–46.6)
HGB: 11.1 g/dL — ABNORMAL LOW (ref 11.6–15.9)
LYMPH#: 1.8 10*3/uL (ref 0.9–3.3)
LYMPH%: 15.7 % (ref 14.0–49.7)
MCH: 30.4 pg (ref 25.1–34.0)
MCHC: 32.6 g/dL (ref 31.5–36.0)
MCV: 93.2 fL (ref 79.5–101.0)
MONO#: 0.7 10*3/uL (ref 0.1–0.9)
MONO%: 5.7 % (ref 0.0–14.0)
NEUT#: 8.7 10*3/uL — ABNORMAL HIGH (ref 1.5–6.5)
NEUT%: 75.8 % (ref 38.4–76.8)
PLATELETS: 520 10*3/uL — AB (ref 145–400)
RBC: 3.65 10*6/uL — ABNORMAL LOW (ref 3.70–5.45)
RDW: 16.8 % — ABNORMAL HIGH (ref 11.2–14.5)
WBC: 11.5 10*3/uL — ABNORMAL HIGH (ref 3.9–10.3)
nRBC: 0 % (ref 0–0)

## 2015-09-15 LAB — COMPREHENSIVE METABOLIC PANEL (CC13)
ALBUMIN: 3.7 g/dL (ref 3.5–5.0)
ALK PHOS: 116 U/L (ref 40–150)
ALT: 20 U/L (ref 0–55)
ANION GAP: 9 meq/L (ref 3–11)
AST: 31 U/L (ref 5–34)
BUN: 15.3 mg/dL (ref 7.0–26.0)
CALCIUM: 9.2 mg/dL (ref 8.4–10.4)
CO2: 22 mEq/L (ref 22–29)
Chloride: 109 mEq/L (ref 98–109)
Creatinine: 1 mg/dL (ref 0.6–1.1)
EGFR: 53 mL/min/{1.73_m2} — ABNORMAL LOW (ref >90)
Glucose: 233 mg/dl — ABNORMAL HIGH (ref 70–140)
POTASSIUM: 4.3 meq/L (ref 3.5–5.1)
Sodium: 140 mEq/L (ref 136–145)
Total Bilirubin: 0.71 mg/dL (ref 0.20–1.20)
Total Protein: 6.7 g/dL (ref 6.4–8.3)

## 2015-09-15 LAB — TSH CHCC: TSH: 3.109 m[IU]/L (ref 0.308–3.960)

## 2015-09-15 NOTE — Telephone Encounter (Signed)
Pt returned call & informed that thyroid function was normal per Dr Alvy Bimler message.

## 2015-09-15 NOTE — Assessment & Plan Note (Signed)
Her blood pressure today is higher than usual and she did confess to an element of feeling upset over the recent Thanksgiving holidays. Her blood pressure check with her primary care doctor recently was within normal limits. She will continue same medication for blood pressure control.

## 2015-09-15 NOTE — Telephone Encounter (Signed)
Gave and printed appts ched and avs for pt for DEC  °

## 2015-09-15 NOTE — Assessment & Plan Note (Signed)
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 She will continue her antiplatelets agents for now

## 2015-09-15 NOTE — Telephone Encounter (Signed)
Notified of message below

## 2015-09-15 NOTE — Assessment & Plan Note (Signed)
So far, she tolerated Jakafi well apart from some abdominal bloating. The total white blood cell count and platelet count improving. I will bring the patient back in a month from now for further evaluation. She will continue same dose treatment without dose adjustment

## 2015-09-15 NOTE — Telephone Encounter (Signed)
-----   Message from [name], MD sent at 09/15/2015  2:17 PM EST ----- Regarding: thyroid function Pls let her thyroid fx is normal ----- Message -----    From: Lab in [facility] Interface    Sent: 09/15/2015  11:55 AM      To: [name], MD

## 2015-09-15 NOTE — Assessment & Plan Note (Addendum)
She has excessive weight gain which is not a feature of side effects of her treatment. I will check her thyroid function tests. In the meantime, I encouraged her dietary modification and exercise. She is attempting to sign up at the local [employer] to join the live strong program.

## 2015-09-15 NOTE — Progress Notes (Signed)
[place] OFFICE PROGRESS NOTE  Patient Care Team: Leamon Arnt, MD as PCP - General (Family Medicine) [name], MD as Attending Physician (Endocrinology) [name], MD as Consulting Physician (Hematology and Oncology) [name], MD as Consulting Physician (Internal Medicine) [name], MD as Consulting Physician (Cardiology)  SUMMARY OF ONCOLOGIC HISTORY:   [name] ([place])   05/31/2013 Initial Diagnosis [name]   06/27/2015 -  Chemotherapy She started Jakafi    INTERVAL HISTORY: Please see below for problem oriented charting. She returns for further follow-up. She is upset over a family conflict over the Thanksgiving holidays. She has gained a lot of weight. She denies chest pain or shortness of breath. No recent infection. She is compliant taking all her medications  REVIEW OF SYSTEMS:   Constitutional: Denies fevers, chills or abnormal weight loss Eyes: Denies blurriness of vision Ears, nose, mouth, throat, and face: Denies mucositis or sore throat Respiratory: Denies cough, dyspnea or wheezes Cardiovascular: Denies palpitation, chest discomfort or lower extremity swelling Gastrointestinal:  Denies nausea, heartburn or change in bowel habits Skin: Denies abnormal skin rashes Lymphatics: Denies new lymphadenopathy or easy bruising Neurological:Denies numbness, tingling or new weaknesses Behavioral/Psych: Mood is stable, no new changes  All other systems were reviewed with the patient and are negative.  I have reviewed the past medical history, past surgical history, social history and family history with the patient and they are unchanged from previous note.  ALLERGIES:  is allergic to lisinopril; tape; doxycycline; and latex.  MEDICATIONS:  Current Outpatient Prescriptions  Medication Sig Dispense Refill  . ALPRAZolam (XANAX) 0.25 MG tablet Take 1 tablet (0.25 mg total) by mouth 3 (three) times daily as needed for  anxiety. 20 tablet 0  . aspirin EC 81 MG tablet Take 81 mg by mouth daily.    . B-D INS SYR ULTRAFINE 1CC/31G 31G X 5/16" 1 ML MISC     . dicyclomine (BENTYL) 10 MG capsule Take 1 tab by mouth every morning, may take twice daily as needed. 60 capsule 8  . glucose blood (ONETOUCH VERIO) test strip Use to test blood sugar 2 times daily as instructed. Dx: E11.41 200 each 3  . hydrochlorothiazide (MICROZIDE) 12.5 MG capsule Take 1 capsule (12.5 mg total) by mouth daily. 90 capsule 3  . hydrocortisone 2.5 % cream Apply 1 application topically 2 (two) times daily.    . Insulin Pen Needle (CAREFINE PEN NEEDLES) 32G X 5 MM MISC Use to inject insulin 2 times daily as instructed. 190 each 1  . insulin regular human CONCENTRATED (HUMULIN R U-500 KWIKPEN) 500 UNIT/ML injection Inject 120 units under the skin before breakfast and 100 units before dinner. 18 pen 1  . insulin regular human CONCENTRATED (HUMULIN R) 500 UNIT/ML injection Inject under skin 66 units before breakfast and dinner 60 mL 1  . insulin regular human CONCENTRATED 500 UNIT/ML SOPN Inject 75 Units into the skin 2 (two) times daily before a meal. 12 pen 1  . loperamide (IMODIUM A-D) 2 MG tablet Take 2 mg by mouth daily.    Marland Kitchen losartan (COZAAR) 100 MG tablet Take 1 tablet (100 mg total) by mouth daily. 90 tablet 3  . metoprolol (LOPRESSOR) 100 MG tablet Take 1 tablet (100 mg total) by mouth 2 (two) times daily. 180 tablet 3  . NONFORMULARY OR COMPOUNDED ITEM Neuropathy cream: mix Amitriptyline 2%, Lidocaine 2%, and Ketamine 1% - apply on feet 2x a day [facility]. 1 each 2  .  nystatin cream (MYCOSTATIN) Apply 1 application topically 2 (two) times daily.    Marland Kitchen omega-3 acid ethyl esters (LOVAZA) 1 G capsule Take 1 capsule (1 g total) by mouth 2 (two) times daily. 60 capsule 11  . ONETOUCH DELICA LANCETS FINE MISC Use to test blood sugar 2 times daily as instructed. Dx: E11.41 200 each 1  . Probiotic Product (PROBIOTIC DAILY PO) Take by  mouth daily.    . rosuvastatin (CRESTOR) 20 MG tablet Take 1 tablet (20 mg total) by mouth daily. 90 tablet 3  . ruxolitinib phosphate (JAKAFI) 20 MG tablet Take 1 tablet (20 mg total) by mouth 2 (two) times daily. 60 tablet 3  . ticagrelor (BRILINTA) 90 MG TABS tablet Take 1 tablet (90 mg total) by mouth 2 (two) times daily. 60 tablet 5  . diphenhydrAMINE (BENADRYL) 25 mg capsule Take 25 mg by mouth every 6 (six) hours as needed.    . nitroGLYCERIN (NITROSTAT) 0.4 MG SL tablet Place 1 tablet (0.4 mg total) under the tongue every 5 (five) minutes as needed for chest pain. (Patient not taking: Reported on 09/15/2015) 25 tablet 3  . ondansetron (ZOFRAN) 8 MG tablet Take 8 mg by mouth every 8 (eight) hours as needed for nausea or vomiting.    Marland Kitchen oxyCODONE-acetaminophen (PERCOCET/ROXICET) 5-325 MG per tablet Take 1 tablet by mouth every 4 (four) hours as needed for moderate pain. (Patient not taking: Reported on 09/15/2015) 20 tablet 0   No current facility-administered medications for this visit.    PHYSICAL EXAMINATION: ECOG PERFORMANCE STATUS: 1 - Symptomatic but completely ambulatory  Filed Vitals:   09/15/15 0823  BP: 186/85  Pulse: 87  Temp: 97.9 F (36.6 C)  Resp: 18   Filed Weights   09/15/15 0823  Weight: 228 lb 11.2 oz (103.738 kg)    GENERAL:alert, no distress and comfortable. She is morbidly obese SKIN: skin color, texture, turgor are normal, no rashes or significant lesions EYES: normal, Conjunctiva are pink and non-injected, sclera clear Musculoskeletal:no cyanosis of digits and no clubbing  NEURO: alert & oriented x 3 with fluent speech, no focal motor/sensory deficits  LABORATORY DATA:  I have reviewed the data as listed    Component Value Date/Time   NA 143 07/22/2015 0844   NA 144 02/04/2015 0411   K 4.3 07/22/2015 0844   K 3.8 02/04/2015 0411   CL 104 02/04/2015 0411   CL 103 02/16/2013 0900   CO2 21* 07/22/2015 0844   CO2 30 02/04/2015 0411   GLUCOSE 146*  07/22/2015 0844   GLUCOSE 109* 02/04/2015 0411   GLUCOSE 396* 02/16/2013 0900   GLUCOSE 292 06/02/2010   BUN 19.7 07/22/2015 0844   BUN 37* 02/04/2015 0411   CREATININE 1.0 07/22/2015 0844   CREATININE 1.07 02/04/2015 0411   CALCIUM 9.0 07/22/2015 0844   CALCIUM 8.9 02/04/2015 0411   PROT 6.4 07/22/2015 0844   PROT 6.2 01/27/2015 1320   ALBUMIN 3.8 07/22/2015 0844   ALBUMIN 3.5 01/27/2015 1320   AST 39* 07/22/2015 0844   AST 68* 01/27/2015 1320   ALT 19 07/22/2015 0844   ALT 18 01/27/2015 1320   ALKPHOS 121 07/22/2015 0844   ALKPHOS 80 01/27/2015 1320   BILITOT 0.59 07/22/2015 0844   BILITOT 1.2 01/27/2015 1320   GFRNONAA 49* 02/04/2015 0411   GFRAA 57* 02/04/2015 0411    No results found for: SPEP, UPEP  Lab Results  Component Value Date   WBC 11.5* 09/15/2015   NEUTROABS 8.7* 09/15/2015  HGB 11.1* 09/15/2015   HCT 34.0* 09/15/2015   MCV 93.2 09/15/2015   PLT 520* 09/15/2015      Chemistry      Component Value Date/Time   NA 143 07/22/2015 0844   NA 144 02/04/2015 0411   K 4.3 07/22/2015 0844   K 3.8 02/04/2015 0411   CL 104 02/04/2015 0411   CL 103 02/16/2013 0900   CO2 21* 07/22/2015 0844   CO2 30 02/04/2015 0411   BUN 19.7 07/22/2015 0844   BUN 37* 02/04/2015 0411   CREATININE 1.0 07/22/2015 0844   CREATININE 1.07 02/04/2015 0411      Component Value Date/Time   CALCIUM 9.0 07/22/2015 0844   CALCIUM 8.9 02/04/2015 0411   ALKPHOS 121 07/22/2015 0844   ALKPHOS 80 01/27/2015 1320   AST 39* 07/22/2015 0844   AST 68* 01/27/2015 1320   ALT 19 07/22/2015 0844   ALT 18 01/27/2015 1320   BILITOT 0.59 07/22/2015 0844   BILITOT 1.2 01/27/2015 1320      ASSESSMENT & PLAN:  [name] So far, she tolerated Jakafi well apart from some abdominal bloating. The total white blood cell count and platelet count improving. I will bring the patient back in a month from now for further evaluation. 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 She will continue her antiplatelets agents for now      WEIGHT GAIN She has excessive weight gain which is not a feature of side effects of her treatment. I will check her thyroid function tests. In the meantime, I encouraged her dietary modification and exercise. She is attempting to sign up at the local [employer] to join the live strong program.  Essential hypertension Her blood pressure today is higher than usual and she did confess to an element of feeling upset over the recent Thanksgiving holidays. Her blood pressure check with her primary care doctor recently was within normal limits. She will continue same medication for blood pressure control.   Orders Placed This Encounter  Procedures  . TSH    Standing Status: Future     Number of Occurrences:      Standing Expiration Date: 10/19/2016  . T4, free    Standing Status: Future     Number of Occurrences:      Standing Expiration Date: 10/19/2016  . CBC with Differential/Platelet    Standing Status: Standing     Number of Occurrences: 33     Standing Expiration Date: 09/14/2016   All questions were answered. The patient knows to call the clinic with any problems, questions or concerns. No barriers to learning was detected. I spent 15 minutes counseling the patient face to face. The total time spent in the appointment was 20 minutes and more than 50% was on counseling and review of test results     [facility], [place], MD 09/15/2015 9:06 AM

## 2015-09-16 ENCOUNTER — Encounter: Payer: Self-pay | Admitting: Gastroenterology

## 2015-09-16 ENCOUNTER — Ambulatory Visit (INDEPENDENT_AMBULATORY_CARE_PROVIDER_SITE_OTHER): Payer: Medicare Other | Admitting: Gastroenterology

## 2015-09-16 VITALS — BP 160/80 | HR 72 | Ht 64.5 in | Wt 228.0 lb

## 2015-09-16 DIAGNOSIS — K7460 Unspecified cirrhosis of liver: Secondary | ICD-10-CM

## 2015-09-16 DIAGNOSIS — R197 Diarrhea, unspecified: Secondary | ICD-10-CM

## 2015-09-16 MED ORDER — VANCOMYCIN HCL 125 MG PO CAPS: ORAL_CAPSULE | ORAL | Status: DC

## 2015-09-16 NOTE — Progress Notes (Signed)
Review of pertinent gastrointestinal problems: 1. Cirrhosis; discovered by imaging 01/2014 (labs 01/2014 normal plts, normal INR, normal bili, Alb slightly low); usual lab/serologic testing 2015 was all normal (not immune to hep a/b)  Most recent imaging: CT 01/2014 cirrhosis without focal liver lesions, [place] 01/2014 cirrhosis, splenomegaly, + gallstones; [place] 08/2014 cirrhosis without lesions  Most recent AFP 08/2014 normal.  EGD 02/2014 no portal hypertensive changes; + irregular z line, biopsies showed no IM  Immunized for hep a/b 2015 2. C. Diff colitis: diarrhea following abx for buttocks abscess 2015; C. Diff PCR +, C. Diff toxin A/B positive; treated with flagyl, vanc   HPI: This is a   very pleasant 74 year old woman whom I last saw about a year ago. She was here in our office 3 or 4 months when she saw Amy Ester would.  Chief complaint is diarrhea  She cannot leave the house due to diarrhea.   Will usually have 5-6 times per day for past at least 3 months.  No nocturnal symptoms.  She has lower   Takes imodium usually daily (or a lomotil) and each of these work.  She breaths heavily since AMI.   Past Medical History  Diagnosis Date  . Hypertension   . Hyperlipidemia   . Diabetes mellitus 2008  . Depression   . Obesity   . Psoriasis   . Polycythemia     Dr. Elease Hashimoto- [facility]  . Herpes simplex   . Gout   . Hyperglycemia 05/31/2013  . C. difficile colitis   . Cirrhosis ([place])   . Cancer ([place])     SKIN  . 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 Candidiasis of skin 09/30/2014  . Myocardial infarction [facility])     Past Surgical History  Procedure Laterality Date  . Breast surgery Left     milk duct  . Total abdominal hysterectomy w/ bilateral salpingoophorectomy      for heavy periods with appendectomy  . Tonsillectomy    . Colonoscopy    . Left heart catheterization with coronary angiogram N/A 01/27/2015    Procedure: LEFT HEART CATHETERIZATION WITH CORONARY  ANGIOGRAM;  Surgeon: [name], MD;  Location: [facility] CATH LAB;  Service: Cardiovascular;  Laterality: N/A;    Current Outpatient Prescriptions  Medication Sig Dispense Refill  . ALPRAZolam (XANAX) 0.25 MG tablet Take 1 tablet (0.25 mg total) by mouth 3 (three) times daily as needed for anxiety. 20 tablet 0  . aspirin EC 81 MG tablet Take 81 mg by mouth daily.    . B-D INS SYR ULTRAFINE 1CC/31G 31G X 5/16" 1 ML MISC     . dicyclomine (BENTYL) 10 MG capsule Take 1 tab by mouth every morning, may take twice daily as needed. 60 capsule 8  . diphenhydrAMINE (BENADRYL) 25 mg capsule Take 25 mg by mouth every 6 (six) hours as needed.    Marland Kitchen glucose blood (ONETOUCH VERIO) test strip Use to test blood sugar 2 times daily as instructed. Dx: E11.41 200 each 3  . hydrochlorothiazide (MICROZIDE) 12.5 MG capsule Take 1 capsule (12.5 mg total) by mouth daily. 90 capsule 3  . hydrocortisone 2.5 % cream Apply 1 application topically 2 (two) times daily.    . Insulin Pen Needle (CAREFINE PEN NEEDLES) 32G X 5 MM MISC Use to inject insulin 2 times daily as instructed. 190 each 1  . insulin regular human CONCENTRATED (HUMULIN R U-500 KWIKPEN) 500 UNIT/ML injection Inject 120 units under the skin  before breakfast and 100 units before dinner. 18 pen 1  . insulin regular human CONCENTRATED (HUMULIN R) 500 UNIT/ML injection Inject under skin 66 units before breakfast and dinner 60 mL 1  . insulin regular human CONCENTRATED 500 UNIT/ML SOPN Inject 75 Units into the skin 2 (two) times daily before a meal. 12 pen 1  . loperamide (IMODIUM A-D) 2 MG tablet Take 2 mg by mouth daily.    Marland Kitchen losartan (COZAAR) 100 MG tablet Take 1 tablet (100 mg total) by mouth daily. 90 tablet 3  . nitroGLYCERIN (NITROSTAT) 0.4 MG SL tablet Place 1 tablet (0.4 mg total) under the tongue every 5 (five) minutes as needed for chest pain. (Patient not taking: Reported on 09/15/2015) 25 tablet 3  . NONFORMULARY OR COMPOUNDED ITEM Neuropathy cream: mix  Amitriptyline 2%, Lidocaine 2%, and Ketamine 1% - apply on feet 2x a day [facility]. 1 each 2  . nystatin cream (MYCOSTATIN) Apply 1 application topically 2 (two) times daily.    Marland Kitchen omega-3 acid ethyl esters (LOVAZA) 1 G capsule Take 1 capsule (1 g total) by mouth 2 (two) times daily. 60 capsule 11  . ondansetron (ZOFRAN) 8 MG tablet Take 8 mg by mouth every 8 (eight) hours as needed for nausea or vomiting.    Glory Rosebush DELICA LANCETS FINE MISC Use to test blood sugar 2 times daily as instructed. Dx: E11.41 200 each 1  . Probiotic Product (PROBIOTIC DAILY PO) Take by mouth daily.    . rosuvastatin (CRESTOR) 20 MG tablet Take 1 tablet (20 mg total) by mouth daily. 90 tablet 3  . ruxolitinib phosphate (JAKAFI) 20 MG tablet Take 1 tablet (20 mg total) by mouth 2 (two) times daily. 60 tablet 3  . ticagrelor (BRILINTA) 90 MG TABS tablet Take 1 tablet (90 mg total) by mouth 2 (two) times daily. 60 tablet 5   No current facility-administered medications for this visit.    Allergies as of 09/16/2015 - Review Complete 09/16/2015  Allergen Reaction Noted  . Lisinopril Cough 04/08/2015  . Tape Hives 01/28/2014  . Doxycycline Hives, Swelling, and Rash   . Latex Hives, Itching, and Rash 09/07/2011    Family History  Problem Relation Age of Onset  . Diabetes Mother   . Heart disease Mother     CAD  . Hyperlipidemia Mother   . Hypertension Mother   . Kidney disease Mother   . Kidney disease Father   . Breast cancer Maternal Aunt   . Heart disease Maternal Grandmother   . Heart disease Other     maternal aunts and uncles    Social History   Social History  . Marital Status: Married    Spouse Name: N/A  . Number of Children: 2  . Years of Education: N/A   Occupational History  . housewife    Social History Main Topics  . Smoking status: Former Smoker -- 48 years    Types: Cigarettes    Quit date: 10/18/1986  . Smokeless tobacco: Never Used  . Alcohol Use: No  . Drug  Use: No  . Sexual Activity: Yes    Birth Control/ Protection: None   Other Topics Concern  . Not on file   Social History Narrative     Physical Exam: BP 160/80 mmHg  Pulse 72  Ht 5' 4.5" (1.638 m)  Wt 228 lb (103.42 kg)  BMI 38.55 kg/m2 Constitutional: generally well-appearing Psychiatric: alert and oriented x3 Abdomen: soft, nontender, nondistended, no obvious ascites,  no peritoneal signs, normal bowel sounds   Assessment and plan: 74 y.o. female with diarrhea, history of C. difficile infection 2015  She is not an ideal endoscopic candidate given her breathing difficulties, her acute MI less than a year ago. She had Clostridium difficile infection proven by lab testing in 2015 and I am suspicious that she has a recurrence. She tells me stool testing by her primary care physician was normal. We'll have those tests at the time of this visit. Imodium helps fortunately.   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 125 mg 4 times daily for 2 weeks. She will call to report on her response in 3 weeks.  She is due for hepatoma screening with ultrasound and we will arrange that for her as well.   Owens Loffler, MD [facility] 09/16/2015, 3:44 PM

## 2015-09-16 NOTE — Patient Instructions (Addendum)
You will be set up for an ultrasound for hepatoma screening, also check for ascites. We will treat, presuming you have C. Diff related diarrhea now, with vancomycin 125 qid for 2 weeks. Please call in 3 weeks to report on your response. Please return to see Dr. [name] in 4-5 weeks. For now, no invasive testing.  You have been scheduled for an abdominal ultrasound at [facility] (1st floor of hospital) on 09/23/2015 at 8:30am. Please arrive 15 minutes prior to your appointment for registration. Make certain not to have anything to eat or drink 6 hours prior to your appointment. Should you need to reschedule your appointment, please contact radiology at [phone]. This test typically takes about 30 minutes to perform.

## 2015-09-17 ENCOUNTER — Telehealth: Payer: Self-pay

## 2015-09-17 MED ORDER — METRONIDAZOLE 500 MG PO TABS: 500.0000 mg | ORAL_TABLET | Freq: Three times a day (TID) | ORAL | Status: DC

## 2015-09-17 NOTE — Telephone Encounter (Signed)
Pt called and stated Vancomycin was $1800. Is there an alternative for her?

## 2015-09-17 NOTE — Telephone Encounter (Signed)
Sent Flagyl to pt's pharmacy. Called and informed pt.

## 2015-09-17 NOTE — Telephone Encounter (Signed)
I prefer the vancomycin but if her insurance wont help her with it, please call in flagyl 568m po tid, for two weeks, no refills.  Please let her know it can make her nauseas.  thanks

## 2015-09-23 ENCOUNTER — Ambulatory Visit (HOSPITAL_COMMUNITY)
Admission: RE | Admit: 2015-09-23 | Discharge: 2015-09-23 | Disposition: A | Discharge status: Home / Self Care | Payer: Medicare Other | Source: Ambulatory Visit | Attending: Gastroenterology | Admitting: Gastroenterology

## 2015-09-23 DIAGNOSIS — K7460 Unspecified cirrhosis of liver: Secondary | ICD-10-CM

## 2015-10-16 ENCOUNTER — Encounter: Payer: Self-pay | Admitting: Hematology and Oncology

## 2015-10-16 ENCOUNTER — Ambulatory Visit (HOSPITAL_BASED_OUTPATIENT_CLINIC_OR_DEPARTMENT_OTHER): Payer: Medicare Other | Admitting: Hematology and Oncology

## 2015-10-16 ENCOUNTER — Telehealth: Payer: Self-pay | Admitting: Hematology and Oncology

## 2015-10-16 ENCOUNTER — Telehealth: Payer: Self-pay | Admitting: *Deleted

## 2015-10-16 ENCOUNTER — Other Ambulatory Visit (HOSPITAL_BASED_OUTPATIENT_CLINIC_OR_DEPARTMENT_OTHER): Payer: Medicare Other

## 2015-10-16 DIAGNOSIS — D45 Polycythemia vera: Secondary | ICD-10-CM

## 2015-10-16 DIAGNOSIS — K521 Toxic gastroenteritis and colitis: Secondary | ICD-10-CM

## 2015-10-16 LAB — CBC WITH DIFFERENTIAL/PLATELET
BASO%: 0.4 % (ref 0.0–2.0)
Basophils Absolute: 0.1 10*3/uL (ref 0.0–0.1)
EOS%: 0.8 % (ref 0.0–7.0)
Eosinophils Absolute: 0.2 10*3/uL (ref 0.0–0.5)
HCT: 38.5 % (ref 34.8–46.6)
HGB: 12.6 g/dL (ref 11.6–15.9)
LYMPH%: 9.7 % — AB (ref 14.0–49.7)
MCH: 29.9 pg (ref 25.1–34.0)
MCHC: 32.7 g/dL (ref 31.5–36.0)
MCV: 91.2 fL (ref 79.5–101.0)
MONO#: 1.2 10*3/uL — AB (ref 0.1–0.9)
MONO%: 6.2 % (ref 0.0–14.0)
NEUT%: 82.9 % — ABNORMAL HIGH (ref 38.4–76.8)
NEUTROS ABS: 15.6 10*3/uL — AB (ref 1.5–6.5)
Platelets: 744 10*3/uL — ABNORMAL HIGH (ref 145–400)
RBC: 4.22 10*6/uL (ref 3.70–5.45)
RDW: 15.9 % — AB (ref 11.2–14.5)
WBC: 18.8 10*3/uL — AB (ref 3.9–10.3)
lymph#: 1.8 10*3/uL (ref 0.9–3.3)
nRBC: 0 % (ref 0–0)

## 2015-10-16 LAB — TECHNOLOGIST REVIEW

## 2015-10-16 MED ORDER — RUXOLITINIB PHOSPHATE 25 MG PO TABS: 25.0000 mg | ORAL_TABLET | Freq: Two times a day (BID) | ORAL | Status: DC

## 2015-10-16 NOTE — Progress Notes (Signed)
[place] OFFICE PROGRESS NOTE  Patient Care Team: Leamon Arnt, MD as PCP - General (Family Medicine) [name], MD as Attending Physician (Endocrinology) [name], MD as Consulting Physician (Hematology and Oncology) [name], MD as Consulting Physician (Internal Medicine) [name], MD as Consulting Physician (Cardiology)  SUMMARY OF ONCOLOGIC HISTORY:   [name] ([place])   05/31/2013 Initial Diagnosis [name]   06/27/2015 -  Chemotherapy She started [facility]   10/16/2015 Miscellaneous Dose of Jakafi is increased to 25 mg BID due to persistent thrombocytosis    INTERVAL HISTORY: Please see below for problem oriented charting. She returns for further follow-up. She is in the process of getting enrolled in the exercise program. She was treated with one course of vancomycin for recurrent C. difficile but that caused severe diarrhea. Since then, her diarrhea average about 3 times a day, well-controlled with Imodium. She denies severe abdominal bloating. She denies had any signs of infection such as fevers, chills, cough or sore throat or dysuria.  REVIEW OF SYSTEMS:   Constitutional: Denies fevers, chills or abnormal weight loss Eyes: Denies blurriness of vision Ears, nose, mouth, throat, and face: Denies mucositis or sore throat Respiratory: Denies cough, dyspnea or wheezes Cardiovascular: Denies palpitation, chest discomfort or lower extremity swelling Skin: Denies abnormal skin rashes Lymphatics: Denies new lymphadenopathy or easy bruising Neurological:Denies numbness, tingling or new weaknesses Behavioral/Psych: Mood is stable, no new changes  All other systems were reviewed with the patient and are negative.  I have reviewed the past medical history, past surgical history, social history and family history with the patient and they are unchanged from previous note.  ALLERGIES:  is allergic to lisinopril; tape;  doxycycline; and latex.  MEDICATIONS:  Current Outpatient Prescriptions  Medication Sig Dispense Refill  . ALPRAZolam (XANAX) 0.25 MG tablet Take 1 tablet (0.25 mg total) by mouth 3 (three) times daily as needed for anxiety. 20 tablet 0  . aspirin EC 81 MG tablet Take 81 mg by mouth daily.    . B-D INS SYR ULTRAFINE 1CC/31G 31G X 5/16" 1 ML MISC     . dicyclomine (BENTYL) 10 MG capsule Take 1 tab by mouth every morning, may take twice daily as needed. 60 capsule 8  . diphenhydrAMINE (BENADRYL) 25 mg capsule Take 25 mg by mouth every 6 (six) hours as needed.    Marland Kitchen glucose blood (ONETOUCH VERIO) test strip Use to test blood sugar 2 times daily as instructed. Dx: E11.41 200 each 3  . hydrochlorothiazide (MICROZIDE) 12.5 MG capsule Take 1 capsule (12.5 mg total) by mouth daily. 90 capsule 3  . hydrocortisone 2.5 % cream Apply 1 application topically 2 (two) times daily.    . Insulin Pen Needle (CAREFINE PEN NEEDLES) 32G X 5 MM MISC Use to inject insulin 2 times daily as instructed. 190 each 1  . insulin regular human CONCENTRATED (HUMULIN R U-500 KWIKPEN) 500 UNIT/ML injection Inject 120 units under the skin before breakfast and 100 units before dinner. 18 pen 1  . insulin regular human CONCENTRATED (HUMULIN R) 500 UNIT/ML injection Inject under skin 66 units before breakfast and dinner 60 mL 1  . insulin regular human CONCENTRATED 500 UNIT/ML SOPN Inject 75 Units into the skin 2 (two) times daily before a meal. 12 pen 1  . loperamide (IMODIUM A-D) 2 MG tablet Take 2 mg by mouth daily.    Marland Kitchen losartan (COZAAR) 100 MG tablet Take 1 tablet (100 mg total) by mouth daily.  90 tablet 3  . metroNIDAZOLE (FLAGYL) 500 MG tablet Take 1 tablet (500 mg total) by mouth 3 (three) times daily. 42 tablet 0  . nitroGLYCERIN (NITROSTAT) 0.4 MG SL tablet Place 1 tablet (0.4 mg total) under the tongue every 5 (five) minutes as needed for chest pain. (Patient not taking: Reported on 09/15/2015) 25 tablet 3  . NONFORMULARY  OR COMPOUNDED ITEM Neuropathy cream: mix Amitriptyline 2%, Lidocaine 2%, and Ketamine 1% - apply on feet 2x a day [facility]. 1 each 2  . nystatin cream (MYCOSTATIN) Apply 1 application topically 2 (two) times daily.    Marland Kitchen omega-3 acid ethyl esters (LOVAZA) 1 G capsule Take 1 capsule (1 g total) by mouth 2 (two) times daily. 60 capsule 11  . ondansetron (ZOFRAN) 8 MG tablet Take 8 mg by mouth every 8 (eight) hours as needed for nausea or vomiting.    Glory Rosebush DELICA LANCETS FINE MISC Use to test blood sugar 2 times daily as instructed. Dx: E11.41 200 each 1  . Probiotic Product (PROBIOTIC DAILY PO) Take by mouth daily.    . rosuvastatin (CRESTOR) 20 MG tablet Take 1 tablet (20 mg total) by mouth daily. 90 tablet 3  . ruxolitinib phosphate (JAKAFI) 20 MG tablet Take 1 tablet (20 mg total) by mouth 2 (two) times daily. 60 tablet 3  . ruxolitinib phosphate (JAKAFI) 25 MG tablet Take 1 tablet (25 mg total) by mouth 2 (two) times daily. 60 tablet 3  . ticagrelor (BRILINTA) 90 MG TABS tablet Take 1 tablet (90 mg total) by mouth 2 (two) times daily. 60 tablet 5  . vancomycin (VANCOCIN) 125 MG capsule Take 1 tablet 4 times daily for 2 weeks. 28 capsule 0   No current facility-administered medications for this visit.    PHYSICAL EXAMINATION: ECOG PERFORMANCE STATUS: 1 - Symptomatic but completely ambulatory BP 131/98 T 97.7 HR 93 RR 20 GENERAL:alert, no distress and comfortable. She is morbidly obese SKIN: skin color, texture, turgor are normal, no rashes or significant lesions EYES: normal, Conjunctiva are pink and non-injected, sclera clear OROPHARYNX:no exudate, no erythema and lips, buccal mucosa, and tongue normal  Musculoskeletal:no cyanosis of digits and no clubbing  NEURO: alert & oriented x 3 with fluent speech, no focal motor/sensory deficits  LABORATORY DATA:  I have reviewed the data as listed    Component Value Date/Time   NA 140 09/15/2015 0806   NA 144 02/04/2015  0411   K 4.3 09/15/2015 0806   K 3.8 02/04/2015 0411   CL 104 02/04/2015 0411   CL 103 02/16/2013 0900   CO2 22 09/15/2015 0806   CO2 30 02/04/2015 0411   GLUCOSE 233* 09/15/2015 0806   GLUCOSE 109* 02/04/2015 0411   GLUCOSE 396* 02/16/2013 0900   GLUCOSE 292 06/02/2010   BUN 15.3 09/15/2015 0806   BUN 37* 02/04/2015 0411   CREATININE 1.0 09/15/2015 0806   CREATININE 1.07 02/04/2015 0411   CALCIUM 9.2 09/15/2015 0806   CALCIUM 8.9 02/04/2015 0411   PROT 6.7 09/15/2015 0806   PROT 6.2 01/27/2015 1320   ALBUMIN 3.7 09/15/2015 0806   ALBUMIN 3.5 01/27/2015 1320   AST 31 09/15/2015 0806   AST 68* 01/27/2015 1320   ALT 20 09/15/2015 0806   ALT 18 01/27/2015 1320   ALKPHOS 116 09/15/2015 0806   ALKPHOS 80 01/27/2015 1320   BILITOT 0.71 09/15/2015 0806   BILITOT 1.2 01/27/2015 1320   GFRNONAA 49* 02/04/2015 0411   GFRAA 57* 02/04/2015 0411  No results found for: SPEP, UPEP  Lab Results  Component Value Date   WBC 18.8* 10/16/2015   NEUTROABS 15.6* 10/16/2015   HGB 12.6 10/16/2015   HCT 38.5 10/16/2015   MCV 91.2 10/16/2015   PLT 744* 10/16/2015      Chemistry      Component Value Date/Time   NA 140 09/15/2015 0806   NA 144 02/04/2015 0411   K 4.3 09/15/2015 0806   K 3.8 02/04/2015 0411   CL 104 02/04/2015 0411   CL 103 02/16/2013 0900   CO2 22 09/15/2015 0806   CO2 30 02/04/2015 0411   BUN 15.3 09/15/2015 0806   BUN 37* 02/04/2015 0411   CREATININE 1.0 09/15/2015 0806   CREATININE 1.07 02/04/2015 0411      Component Value Date/Time   CALCIUM 9.2 09/15/2015 0806   CALCIUM 8.9 02/04/2015 0411   ALKPHOS 116 09/15/2015 0806   ALKPHOS 80 01/27/2015 1320   AST 31 09/15/2015 0806   AST 68* 01/27/2015 1320   ALT 20 09/15/2015 0806   ALT 18 01/27/2015 1320   BILITOT 0.71 09/15/2015 0806   BILITOT 1.2 01/27/2015 1320      ASSESSMENT & PLAN:  [name] Over the last 2 months, she has progressive leukocytosis and thrombocytosis. Recently, she got  treated with another course of antibiotic for possible C. difficile induced diarrhea. It is not clear to me that she has active infection at this moment in time. I reviewed the guidelines with her and recommended increasing the dose of Jakafi to 25 mg twice a day and recheck in another month and she agreed with the plan of care.  Diarrhea due to drug She was recently treated with another course of antibiotic for C. difficile infection. Her diarrhea averages about 3 times a day. I reviewed the adverse effect with the patient's. Shanon Brow can cause up to 15% risk of diarrhea. The diarrhea appears to be well-controlled with Imodium. We will continue conservative management right now.   Orders Placed This Encounter  Procedures  . Comprehensive metabolic panel    Standing Status: Future     Number of Occurrences:      Standing Expiration Date: 11/19/2016   All questions were answered. The patient knows to call the clinic with any problems, questions or concerns. No barriers to learning was detected. I spent 15 minutes counseling the patient face to face. The total time spent in the appointment was 20 minutes and more than 50% was on counseling and review of test results     [facility], [place], MD 10/16/2015 8:32 AM

## 2015-10-16 NOTE — Telephone Encounter (Deleted)
Spoke with pharmacist. Verbal order given by Dr Alvy Bimler to provide patient with  Jakafi 5 mg tablets. Pt currently has 20 mg tablets and Dr Alvy Bimler wants her to increase to 25 mg. Will send new rx for 25 mg tablets when she completes the current

## 2015-10-16 NOTE — Telephone Encounter (Signed)
Spoke with pharmacist at [facility]. Verbal order given for Jakafi 5 mg tablets # 26 tabs. Pt currently has 20 mg tablets and Dr Alvy Bimler wants to increase dose to 25 mg.   Will fax new prescription for 25 mg tablets on 10/21/15 to start 10/28/15.

## 2015-10-16 NOTE — Assessment & Plan Note (Signed)
She was recently treated with another course of antibiotic for C. difficile infection. Her diarrhea averages about 3 times a day. I reviewed the adverse effect with the patient's. Shanon Brow can cause up to 15% risk of diarrhea. The diarrhea appears to be well-controlled with Imodium. We will continue conservative management right now.

## 2015-10-16 NOTE — Telephone Encounter (Signed)
Gave patient avs report and appointments for January.

## 2015-10-16 NOTE — Assessment & Plan Note (Signed)
Over the last 2 months, she has progressive leukocytosis and thrombocytosis. Recently, she got treated with another course of antibiotic for possible C. difficile induced diarrhea. It is not clear to me that she has active infection at this moment in time. I reviewed the guidelines with her and recommended increasing the dose of Jakafi to 25 mg twice a day and recheck in another month and she agreed with the plan of care.

## 2015-10-21 ENCOUNTER — Telehealth: Payer: Self-pay | Admitting: *Deleted

## 2015-10-21 ENCOUNTER — Other Ambulatory Visit: Payer: Self-pay | Admitting: *Deleted

## 2015-10-21 NOTE — Telephone Encounter (Signed)
Jakafi dose increased to 25 mg.  New Rx faxed to [facility].  Pt to start new dose on 10/28/15.

## 2015-10-21 NOTE — Telephone Encounter (Signed)
Pharmacist from [facility] left message that jakafi 33m script would be discontinued in light of new script for 28m  She requested call back.  Called back and left message told her that was correct.  Patient got 24m56mablets to complement 39m72mblets that patient already had to make a 224mg51me which is now the correct dose.

## 2015-10-23 ENCOUNTER — Encounter: Payer: Medicare Other | Admitting: Family Medicine

## 2015-10-28 ENCOUNTER — Encounter: Payer: Self-pay | Admitting: Family Medicine

## 2015-10-28 ENCOUNTER — Ambulatory Visit (INDEPENDENT_AMBULATORY_CARE_PROVIDER_SITE_OTHER): Payer: Medicare Other | Admitting: Family Medicine

## 2015-10-28 VITALS — BP 146/64 | HR 67 | Wt 225.0 lb

## 2015-10-28 DIAGNOSIS — I10 Essential (primary) hypertension: Secondary | ICD-10-CM

## 2015-10-28 DIAGNOSIS — Z794 Long term (current) use of insulin: Secondary | ICD-10-CM | POA: Diagnosis not present

## 2015-10-28 DIAGNOSIS — E1149 Type 2 diabetes mellitus with other diabetic neurological complication: Secondary | ICD-10-CM

## 2015-10-28 DIAGNOSIS — R635 Abnormal weight gain: Secondary | ICD-10-CM

## 2015-10-28 DIAGNOSIS — IMO0002 Reserved for concepts with insufficient information to code with codable children: Secondary | ICD-10-CM

## 2015-10-28 DIAGNOSIS — Z23 Encounter for immunization: Secondary | ICD-10-CM

## 2015-10-28 DIAGNOSIS — R188 Other ascites: Secondary | ICD-10-CM

## 2015-10-28 DIAGNOSIS — I2510 Atherosclerotic heart disease of native coronary artery without angina pectoris: Secondary | ICD-10-CM

## 2015-10-28 DIAGNOSIS — E1165 Type 2 diabetes mellitus with hyperglycemia: Secondary | ICD-10-CM

## 2015-10-28 DIAGNOSIS — K7460 Unspecified cirrhosis of liver: Secondary | ICD-10-CM | POA: Insufficient documentation

## 2015-10-28 LAB — POCT GLYCOSYLATED HEMOGLOBIN (HGB A1C): HEMOGLOBIN A1C: 9.6

## 2015-10-28 NOTE — Progress Notes (Signed)
Subjective:    Patient ID: Amanda [name], female    DOB: 1941/01/01, 75 y.o.   MRN: [id number]  HPI DM - was dx 8 years ago.  She does see an endocrinologist and is currently on Humulin quick pen. Her medication list it does not look like she is taking any long-acting insulin.  CAD - Dx with MI back in April. Had neck pain and went to the ED.  Had 2 stents placed. She is now on Crestor and a baby aspirin daily. She is on Brilinta.  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  Insomnia - she is also very concerned because she's not been sleeping well at all.  She also reports a diagnosis of cirrhosis. She said she's had that diagnosis for years. She's never had a biopsy. She has started the series for [facility].  He says she is most concerned about her weight gain. She feels like it's really starting to make her feel bad and causing some joint aches and pains. She gets short of breath with activities. She would really like something to help her with this.  Review of Systems  Constitutional: Negative for fever, diaphoresis and unexpected weight change.  HENT: Negative for hearing loss, rhinorrhea and tinnitus.   Eyes: Negative for visual disturbance.  Respiratory: Negative for cough and wheezing.   Cardiovascular: Negative for chest pain and palpitations.  Gastrointestinal: Negative for nausea, vomiting, diarrhea and blood in stool.  Genitourinary: Negative for vaginal bleeding, vaginal discharge and difficulty urinating.  Musculoskeletal: Negative for myalgias and arthralgias.  Skin: Negative for rash.  Neurological: Negative for headaches.  Hematological: Negative for adenopathy. Does not bruise/bleed easily.  Psychiatric/Behavioral: Negative for sleep disturbance and dysphoric mood. The patient is not nervous/anxious.     BP 146/64 mmHg  Pulse 67  Wt 225 lb (102.059 kg)  SpO2 95%    Allergies  Allergen Reactions  . Lisinopril Cough  . Tape Hives  .  Doxycycline Hives, Swelling and Rash  . Latex Hives, Itching and Rash    Past Medical History  Diagnosis Date  . Hypertension   . Hyperlipidemia   . Diabetes mellitus 2008  . Depression   . Obesity   . Psoriasis   . Polycythemia     Dr. Elease Hashimoto- [facility]  . Herpes simplex   . Gout   . Hyperglycemia 05/31/2013  . C. difficile colitis   . Cirrhosis ([place])   . Cancer ([place])     SKIN  . 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 Candidiasis of skin 09/30/2014  . Myocardial infarction [facility])     Past Surgical History  Procedure Laterality Date  . Breast surgery Left     milk duct  . Total abdominal hysterectomy w/ bilateral salpingoophorectomy      for heavy periods with appendectomy  . Tonsillectomy    . Colonoscopy    . Left heart catheterization with coronary angiogram N/A 01/27/2015    Procedure: LEFT HEART CATHETERIZATION WITH CORONARY ANGIOGRAM;  Surgeon: [name], MD;  Location[facility] CATH LAB;  Service: Cardiovascular;  Laterality: N/A;    Social History   Social History  . Marital Status: Married    Spouse Name: N/A  . Number of Children: 2  . Years of Education: N/A   Occupational History  . housewife    Social History Main Topics  . Smoking status: Former Smoker -- 48 years    Types: Cigarettes  Quit date: 10/18/1986  . Smokeless tobacco: Never Used  . Alcohol Use: No  . Drug Use: No  . Sexual Activity: Yes    Birth Control/ Protection: None   Other Topics Concern  . Not on file   Social History Narrative    Family History  Problem Relation Age of Onset  . Diabetes Mother   . Heart disease Mother     CAD  . Hyperlipidemia Mother   . Hypertension Mother   . Kidney disease Mother   . Kidney disease Father   . Breast cancer Maternal Aunt   . Heart disease Maternal Grandmother   . Heart disease Other     maternal aunts and uncles    Outpatient Encounter Prescriptions as of 10/28/2015  Medication Sig  . ALPRAZolam (XANAX)  0.25 MG tablet Take 1 tablet (0.25 mg total) by mouth 3 (three) times daily as needed for anxiety.  Marland Kitchen aspirin EC 81 MG tablet Take 81 mg by mouth daily.  . B-D INS SYR ULTRAFINE 1CC/31G 31G X 5/16" 1 ML MISC   . dicyclomine (BENTYL) 10 MG capsule Take 1 tab by mouth every morning, may take twice daily as needed.  Marland Kitchen glucose blood (ONETOUCH VERIO) test strip Use to test blood sugar 2 times daily as instructed. Dx: E11.41  . hydrochlorothiazide (MICROZIDE) 12.5 MG capsule Take 1 capsule (12.5 mg total) by mouth daily.  . hydrocortisone 2.5 % cream Apply 1 application topically 2 (two) times daily.  . Insulin Pen Needle (CAREFINE PEN NEEDLES) 32G X 5 MM MISC Use to inject insulin 2 times daily as instructed.  . insulin regular human CONCENTRATED (HUMULIN R U-500 KWIKPEN) 500 UNIT/ML injection Inject 120 units under the skin before breakfast and 100 units before dinner.  . insulin regular human CONCENTRATED (HUMULIN R) 500 UNIT/ML injection Inject under skin 66 units before breakfast and dinner  . insulin regular human CONCENTRATED 500 UNIT/ML SOPN Inject 75 Units into the skin 2 (two) times daily before a meal.  . loperamide (IMODIUM A-D) 2 MG tablet Take 2 mg by mouth daily.  Marland Kitchen losartan (COZAAR) 100 MG tablet Take 1 tablet (100 mg total) by mouth daily.  . nitroGLYCERIN (NITROSTAT) 0.4 MG SL tablet Place 1 tablet (0.4 mg total) under the tongue every 5 (five) minutes as needed for chest pain.  . NONFORMULARY OR COMPOUNDED ITEM Neuropathy cream: mix Amitriptyline 2%, Lidocaine 2%, and Ketamine 1% - apply on feet 2x a day [facility].  Marland Kitchen nystatin cream (MYCOSTATIN) Apply 1 application topically 2 (two) times daily.  Marland Kitchen omega-3 acid ethyl esters (LOVAZA) 1 G capsule Take 1 capsule (1 g total) by mouth 2 (two) times daily.  . ondansetron (ZOFRAN) 8 MG tablet Take 8 mg by mouth every 8 (eight) hours as needed for nausea or vomiting.  Glory Rosebush DELICA LANCETS FINE MISC Use to test blood sugar 2  times daily as instructed. Dx: E11.41  . rosuvastatin (CRESTOR) 20 MG tablet Take 1 tablet (20 mg total) by mouth daily.  . ruxolitinib phosphate (JAKAFI) 20 MG tablet Take 1 tablet (20 mg total) by mouth 2 (two) times daily.  . ruxolitinib phosphate (JAKAFI) 25 MG tablet Take 1 tablet (25 mg total) by mouth 2 (two) times daily.  . ticagrelor (BRILINTA) 90 MG TABS tablet Take 1 tablet (90 mg total) by mouth 2 (two) times daily.  . [DISCONTINUED] diphenhydrAMINE (BENADRYL) 25 mg capsule Take 25 mg by mouth every 6 (six) hours as needed.  . [  DISCONTINUED] metroNIDAZOLE (FLAGYL) 500 MG tablet Take 1 tablet (500 mg total) by mouth 3 (three) times daily.  . [DISCONTINUED] Probiotic Product (PROBIOTIC DAILY PO) Take by mouth daily.  . [DISCONTINUED] vancomycin (VANCOCIN) 125 MG capsule Take 1 tablet 4 times daily for 2 weeks.   No facility-administered encounter medications on file as of 10/28/2015.          Objective:   Physical Exam  Constitutional: She is oriented to person, place, and time. She appears well-developed and well-nourished.  HENT:  Head: Normocephalic and atraumatic.  Right Ear: External ear normal.  Left Ear: External ear normal.  Nose: Nose normal.  Mouth/Throat: Oropharynx is clear and moist.  TMs and canals are clear.   Eyes: Conjunctivae and EOM are normal. Pupils are equal, round, and reactive to light.  Neck: Neck supple. No thyromegaly present.  Cardiovascular: Normal rate, regular rhythm and normal heart sounds.   Pulmonary/Chest: Effort normal and breath sounds normal. She has no wheezes.  Abdominal: Soft. Bowel sounds are normal.  Musculoskeletal: She exhibits no edema.  Lymphadenopathy:    She has no cervical adenopathy.  Neurological: She is alert and oriented to person, place, and time.  Skin: Skin is warm and dry. No pallor.  Psychiatric: She has a normal mood and affect. Her behavior is normal. Judgment and thought content normal.  Vitals  reviewed.         Assessment & Plan:  Diabetes- A1c is elevated today though she also has polycythemia which will falsely elevate the A1c level. She does follow with endocrinology. Reminded her to get up-to-date eye exam.  Abnormal weight gain-we discussed that she may be a candidate for GLP-1 agonist. This could potentially help with a 5-10% weight loss in addition to regular exercise and continue to watch her diet.  Hypertension-her blood pressures not quite at goal today. We could consider increasing her hydrochlorothiazide. I really like the opportunity look through her notes before making any changes. She may have had an intolerance to a higher dose of a diuretic previously.  Coronary artery disease-stable. She is currently on a statin, she is not on a beta blocker.  Cirrhosis-she does not have ascites. She did start her hepatitis A B vaccination series. Given Prevnar 13 today. She has never had an endoscopy or biopsy. She has never been a heavy alcohol drinker.

## 2015-10-29 ENCOUNTER — Encounter: Payer: Self-pay | Admitting: Family Medicine

## 2015-10-30 ENCOUNTER — Telehealth: Payer: Self-pay

## 2015-10-30 NOTE — Telephone Encounter (Signed)
Dr. Letta Median writes her insulin. That is who they will have to call.

## 2015-10-31 NOTE — Telephone Encounter (Signed)
Left a message advising of recommendations.  

## 2015-11-13 ENCOUNTER — Ambulatory Visit (INDEPENDENT_AMBULATORY_CARE_PROVIDER_SITE_OTHER): Payer: Medicare Other | Admitting: Internal Medicine

## 2015-11-13 ENCOUNTER — Encounter: Payer: Self-pay | Admitting: Internal Medicine

## 2015-11-13 VITALS — BP 124/80 | HR 93 | Temp 97.7°F | Resp 12 | Wt 220.8 lb

## 2015-11-13 DIAGNOSIS — E1165 Type 2 diabetes mellitus with hyperglycemia: Secondary | ICD-10-CM | POA: Diagnosis not present

## 2015-11-13 DIAGNOSIS — E1149 Type 2 diabetes mellitus with other diabetic neurological complication: Secondary | ICD-10-CM | POA: Diagnosis not present

## 2015-11-13 DIAGNOSIS — Z794 Long term (current) use of insulin: Secondary | ICD-10-CM

## 2015-11-13 DIAGNOSIS — IMO0002 Reserved for concepts with insufficient information to code with codable children: Secondary | ICD-10-CM

## 2015-11-13 MED ORDER — INSULIN REGULAR HUMAN (CONC) 500 UNIT/ML ~~LOC~~ SOPN: PEN_INJECTOR | SUBCUTANEOUS | Status: DC

## 2015-11-13 NOTE — Patient Instructions (Signed)
Please change the U500 insulin doses: - 100 units before b/fast - 85 units before dinner (increase to 90 if your sugars at bedtime are >180 and if there are no lows)  Please return in 3 months with your sugar log.   Please have a fructosamine drawn at next lab draw.

## 2015-11-13 NOTE — Progress Notes (Signed)
Patient ID: Amanda [name], female   DOB: Jan 01, 1941, 75 y.o.   MRN: [id number]  HPI: Amanda [name] is a 75 y.o.-year-old female, returning for f/u for DM2, dx 2008, insulin-dependent since dx, uncontrolled, with complications (DR, PN, CAD). Last visit 3 mo ago.  She has a URI now.   She was started on Jakafi before last visit >> sugars better.   Last hemoglobin A1c was: 07/01/2015: HbA1c from fructosamine 6.57%. 01/27/2015: HbA1c from fructosamine: 6.57% 05/16/2014: HbA1c from fructosamine: 7.95%. Lab Results  Component Value Date   HGBA1C 9.6 10/28/2015   HGBA1C 8.8* 01/27/2015   HGBA1C 9.8* 05/16/2014  - in 10/16/2012, hemoglobin A1c was 6.9% - in 07/2012, hemoglobin A1c was 9.9% - in 11/16/2010, hemoglobin A1c was 12.8% - in 03/2011, hemoglobin A1c was 6.9% - In 07/10/2010 her hemoglobin A1c was 10.5% C-peptide was 6.3 and 01/2011.  Pt was on a regimen of: - Levemir 45 units 2x a day, in am and at bedtime - Humalog mealtime: 40 units with a smaller meal 45 units with a larger meal  - Sliding Scale of Humalog:  - 150- 165: + 1 unit  - 166- 180: + 2 units  - 181- 195: + 3 units  - 196- 210: + 4 units  - 211- 225: + 5 units  - 226- 240: + 6 units  - >240: + 7 units  She could not tolerate doses of metformin XR higher than 250 mg twice a day in the past due to diarrhea. She does not want to resume this. Was previously on the Victoza 1.8 mg daily - "made me sick". She was previously on Janumet between 2012-2013. She was on Invokana - but could not afford it while in the Medicare doughnut hole.  We switched to U500: - 15 units - on the syringe (0.15 mL) before breakfast >> 20 units >> pens (100 units) - 18 units - on the syringe (0.18 mL) before dinner >> 24 units >> pens (80 units)  She checks sugars 2x a day - better: - am: 190-255 >> 209-347 >> same >> 90-211, 235 >> 96, 128-206, 291 >> 170-200 - 2h after b'fast: n/c >> 238 >> n/c - before lunch: 140, 223, 247 >>  161-319 >> 258-290 >> 167-343 >> 175-340 >> 238 >> n/c - 2h after lunch: 240-312 >> n/c >> 270-456 >> 112-218 >> n/c - before dinner: 175-336 >> 147, 273 >> 58, 66, 89-157, 203, 224 (318 - Holiday) >> 150-160 - bedtime: 152-306 >> n/c >> 161-326 >> 164, 240, 240, 268 >> 273 >> n/c No lows. Lowest sugar was 90x1 >> 70x1 >> 70-80s at 4 pm >> 58 >> 100; she has hypoglycemia awareness at 100. Highest 300x1.  Pt's meals are - reviewed per last visit: - Breakfast: ("I am not a breakfast eater") 1 egg + toast; 3 pancakes + sugarfree syrup; waffle; french toast - not skipping anymore  - Lunch: sometimes skips, sometimes eats out: pizza, salad, no dessert - Dinner: at 5:30-6pm: soup, meat + vegetables + starch, dessert - Snacks: 8 pm: fruit, sugar free  She was seen for nutritional advice and diabetes education, with last visit on 04/2012 - [name].  - no CKD, last BUN/creatinine:  Lab Results  Component Value Date   BUN 15.3 09/15/2015   CREATININE 1.0 09/15/2015  She is on ACEI.  - last set of lipids: Lab Results  Component Value Date   CHOL 168 01/27/2015   HDL 27* 01/27/2015  LDLCALC 88 01/27/2015   TRIG 266* 01/27/2015   CHOLHDL 6.2 01/27/2015  She is on Pravastatin. - last eye exam was in 07/2013. 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 At last eye exam (Dr Zigmund Daniel): cataracts. - no numbness and tingling in her feet. For her peripheral neuropathy >> saw PCP, podiatrist, orthopedic >> tried capsaicin cream, compression stockings, then Neurontin >> on 600 mg 3x a day. She is on aspirin 81 mg daily.  She also has a history of hypertension, diastolic dysfunction, hyperlipidemia, polycytemia vera, gout, psoriasis, depression, history of Bell's palsy, obesity, vitamin D deficiency.  On 01/27/2015 she had an AMI >> had 2 stents placed. She had very erratic sugars in the hospital.  She recently changed the PVera tx. Starts Jakafi.  ROS: Constitutional: + weight loss, no  fatigue Eyes: no blurry vision, no xerophthalmia ENT: no sore throat, no nodules palpated in throat, no dysphagia/odynophagia, no hoarseness Cardiovascular: no CP/palpitations/leg swelling Respiratory:+ cough/+ SOB Gastrointestinal: no N/V/+ D/no C Musculoskeletal: no muscle/joint aches Skin: No rashes Neurological: no tremors/numbness/tingling/dizziness  I reviewed pt's medications, allergies, PMH, social hx, family hx, and changes were documented in the history of present illness. Otherwise, unchanged from my initial visit note.  PE: BP 124/80 mmHg  Pulse 93  Temp(Src) 97.7 F (36.5 C) (Oral)  Resp 12  Wt 220 lb 12.8 oz (100.154 kg)  SpO2 93% Body mass index is 37.33 kg/(m^2). Wt Readings from Last 3 Encounters:  11/13/15 220 lb 12.8 oz (100.154 kg)  10/28/15 225 lb (102.059 kg)  09/16/15 228 lb (103.42 kg)   Constitutional: overweight, in NAD, coughing Eyes: PERRLA, EOMI, no exophthalmos ENT: moist mucous membranes, no thyromegaly, no cervical lymphadenopathy Cardiovascular: tachycardia RR, No MRG, no leg swelling Respiratory: CTA B Gastrointestinal: abdomen soft, NT, ND, BS+ Musculoskeletal: no deformities, strength intact in all 4 Skin: moist, warm  ASSESSMENT: 1. 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 Diabetic peripheral neuropathy  PLAN:  1. Patient with long-standing, uncontrolled diabetes, doing better on U500 insulin (which is 5x more concentrated and takes place of both short and long acting insulin), but still higher than target CBGs, especially in am. I advised her to start checking sugars at bedtime and increase the insulin with dinner.  - Last fructosamine reviewed >> calculated HbA1c 6.57%, stable. - I advised her to: Patient Instructions  Please change the U500 insulin doses: - 100 units before b/fast - 85 units before dinner (increase to 90 if your sugars at bedtime are >180 and if  there are no lows)  Please return in 3 months with your sugar log.  \ Please have a fructosamine drawn at next lab draw.   - check 3 times a day, rotating checks - needs a new eye exam (Dr. Zigmund Daniel)  >> again advised to schedule!!! - will check a  Fructosamine - The HbA1c is unreliable and should not be checked. - had flu vaccine this season - Return to clinic in 3 months with sugar log  2. Diabetic peripheral neuropathy - on Neurontin to 600 mg 3 times a day - on neuropathy cream: mix Amitriptyline 2%, Lidocaine 2%, and Ketamine 1% - apply on feet 2x a day - better after her stents  Orders Placed This Encounter  Procedures  . Fructosamine    Standing Status: Future     Number of Occurrences:      Standing Expiration Date: 11/12/2016

## 2015-11-18 ENCOUNTER — Ambulatory Visit: Payer: Medicare Other | Admitting: Hematology and Oncology

## 2015-11-18 ENCOUNTER — Other Ambulatory Visit: Payer: Medicare Other

## 2015-11-18 ENCOUNTER — Telehealth: Payer: Self-pay | Admitting: *Deleted

## 2015-11-18 NOTE — Telephone Encounter (Signed)
Called pt regarding missed appts this morning.  Pt states she missed d/t her husband having out patient surgery yesterday but was kept overnight and she was up all night.  She says she just got off the phone w/ one our schedulers to r/s her appt to this Thursday.

## 2015-11-20 ENCOUNTER — Other Ambulatory Visit (HOSPITAL_BASED_OUTPATIENT_CLINIC_OR_DEPARTMENT_OTHER): Payer: Medicare Other

## 2015-11-20 ENCOUNTER — Telehealth: Payer: Self-pay | Admitting: Hematology and Oncology

## 2015-11-20 ENCOUNTER — Telehealth: Payer: Self-pay | Admitting: *Deleted

## 2015-11-20 ENCOUNTER — Telehealth: Payer: Self-pay | Admitting: Internal Medicine

## 2015-11-20 ENCOUNTER — Ambulatory Visit (HOSPITAL_BASED_OUTPATIENT_CLINIC_OR_DEPARTMENT_OTHER): Payer: Medicare Other | Admitting: Hematology and Oncology

## 2015-11-20 ENCOUNTER — Encounter: Payer: Self-pay | Admitting: Hematology and Oncology

## 2015-11-20 VITALS — BP 180/70 | HR 95 | Temp 97.6°F | Resp 18 | Ht 64.5 in | Wt 220.1 lb

## 2015-11-20 DIAGNOSIS — R0781 Pleurodynia: Secondary | ICD-10-CM

## 2015-11-20 DIAGNOSIS — E1140 Type 2 diabetes mellitus with diabetic neuropathy, unspecified: Secondary | ICD-10-CM | POA: Diagnosis not present

## 2015-11-20 DIAGNOSIS — E1165 Type 2 diabetes mellitus with hyperglycemia: Secondary | ICD-10-CM

## 2015-11-20 DIAGNOSIS — R21 Rash and other nonspecific skin eruption: Secondary | ICD-10-CM | POA: Diagnosis not present

## 2015-11-20 DIAGNOSIS — I10 Essential (primary) hypertension: Secondary | ICD-10-CM | POA: Diagnosis not present

## 2015-11-20 DIAGNOSIS — D45 Polycythemia vera: Secondary | ICD-10-CM

## 2015-11-20 LAB — CBC WITH DIFFERENTIAL/PLATELET
BASO%: 1 % (ref 0.0–2.0)
BASOS ABS: 0.2 10*3/uL — AB (ref 0.0–0.1)
EOS ABS: 0.2 10*3/uL (ref 0.0–0.5)
EOS%: 1.4 % (ref 0.0–7.0)
HEMATOCRIT: 42.4 % (ref 34.8–46.6)
HGB: 13.4 g/dL (ref 11.6–15.9)
LYMPH#: 2.6 10*3/uL (ref 0.9–3.3)
LYMPH%: 14.7 % (ref 14.0–49.7)
MCH: 28 pg (ref 25.1–34.0)
MCHC: 31.6 g/dL (ref 31.5–36.0)
MCV: 88.7 fL (ref 79.5–101.0)
MONO#: 0.7 10*3/uL (ref 0.1–0.9)
MONO%: 4.2 % (ref 0.0–14.0)
NEUT#: 13.7 10*3/uL — ABNORMAL HIGH (ref 1.5–6.5)
NEUT%: 78.7 % — AB (ref 38.4–76.8)
PLATELETS: 556 10*3/uL — AB (ref 145–400)
RBC: 4.78 10*6/uL (ref 3.70–5.45)
RDW: 16.7 % — ABNORMAL HIGH (ref 11.2–14.5)
WBC: 17.4 10*3/uL — ABNORMAL HIGH (ref 3.9–10.3)

## 2015-11-20 LAB — COMPREHENSIVE METABOLIC PANEL
ALK PHOS: 109 U/L (ref 40–150)
ALT: 14 U/L (ref 0–55)
ANION GAP: 13 meq/L — AB (ref 3–11)
AST: 25 U/L (ref 5–34)
Albumin: 3.5 g/dL (ref 3.5–5.0)
BUN: 21.3 mg/dL (ref 7.0–26.0)
CALCIUM: 8.5 mg/dL (ref 8.4–10.4)
CHLORIDE: 101 meq/L (ref 98–109)
CO2: 19 mEq/L — ABNORMAL LOW (ref 22–29)
Creatinine: 2.8 mg/dL — ABNORMAL HIGH (ref 0.6–1.1)
EGFR: 16 mL/min/{1.73_m2} — AB (ref >90)
Glucose: 618 mg/dl (ref 70–140)
POTASSIUM: 4.1 meq/L (ref 3.5–5.1)
Sodium: 132 mEq/L — ABNORMAL LOW (ref 136–145)
Total Bilirubin: 0.56 mg/dL (ref 0.20–1.20)
Total Protein: 6.3 g/dL — ABNORMAL LOW (ref 6.4–8.3)

## 2015-11-20 LAB — TECHNOLOGIST REVIEW

## 2015-11-20 NOTE — Telephone Encounter (Signed)
Pt notified of instructions via voicemail.

## 2015-11-20 NOTE — Telephone Encounter (Signed)
See note below and please advise, Thanks! 

## 2015-11-20 NOTE — Telephone Encounter (Signed)
Patient returned collaborative call.  Blood sugar checked at 2:50 pm = 365.  Has not called PCP yet but says she will when this call ends.  Advised contact PCP ASAP due to time of day, continue with hydration.  This call ended at 3:02 pm.

## 2015-11-20 NOTE — Telephone Encounter (Signed)
Patient that her B/S has went down to 367, what do she need to do, please advise

## 2015-11-20 NOTE — Telephone Encounter (Signed)
per pof to sch tp appt-gave pt copy of avs

## 2015-11-20 NOTE — Telephone Encounter (Signed)
Duplicate

## 2015-11-20 NOTE — Progress Notes (Signed)
[facility] OFFICE PROGRESS NOTE  Patient Care Team: Hali Marry, MD as PCP - General (Family Medicine) [name], MD as Consulting Physician (Hematology and Oncology) [name], MD as Consulting Physician (Endocrinology) [name], MD as Consulting Physician (Cardiology) Milus Banister, MD as Attending Physician (Gastroenterology)  SUMMARY OF ONCOLOGIC HISTORY:   [name] ([place])   05/31/2013 Initial Diagnosis [name]   06/27/2015 -  Chemotherapy She started [facility]   10/16/2015 Miscellaneous Dose of Jakafi is increased to 25 mg BID due to persistent thrombocytosis    INTERVAL HISTORY: Please see below for problem oriented charting. She felt terrible. She missed several doses of her chemotherapy because she is busy taking care of her husband who recently had surgery. She missed all her medications today because she was rushing to the clinic She felt tired. She complained of rashes at the site of phlebotomy today. She had recent cold and have significant coughing. She thinks she pulled a muscle from coughing and had pain over the left rib cage area. She is using conservative management with heat pad. She denies fevers or chills. She felt that her cold is getting better.  REVIEW OF SYSTEMS:   Constitutional: Denies fevers, chills or abnormal weight loss Eyes: Denies blurriness of vision Ears, nose, mouth, throat, and face: Denies mucositis or sore throat Cardiovascular: Denies palpitation, chest discomfort or lower extremity swelling Gastrointestinal:  Denies nausea, heartburn or change in bowel habits Lymphatics: Denies new lymphadenopathy or easy bruising Neurological:Denies numbness, tingling or new weaknesses Behavioral/Psych: Mood is stable, no new changes  All other systems were reviewed with the patient and are negative.  I have reviewed the past medical history, past surgical history, social history and family history  with the patient and they are unchanged from previous note.  ALLERGIES:  is allergic to lisinopril; tape; doxycycline; and latex.  MEDICATIONS:  Current Outpatient Prescriptions  Medication Sig Dispense Refill  . ALPRAZolam (XANAX) 0.25 MG tablet Take 1 tablet (0.25 mg total) by mouth 3 (three) times daily as needed for anxiety. 20 tablet 0  . aspirin EC 81 MG tablet Take 81 mg by mouth daily.    . B-D INS SYR ULTRAFINE 1CC/31G 31G X 5/16" 1 ML MISC     . dicyclomine (BENTYL) 10 MG capsule Take 1 tab by mouth every morning, may take twice daily as needed. 60 capsule 8  . glucose blood (ONETOUCH VERIO) test strip Use to test blood sugar 2 times daily as instructed. Dx: E11.41 200 each 3  . hydrochlorothiazide (MICROZIDE) 12.5 MG capsule Take 1 capsule (12.5 mg total) by mouth daily. 90 capsule 3  . hydrocortisone 2.5 % cream Apply 1 application topically 2 (two) times daily.    . Insulin Pen Needle (CAREFINE PEN NEEDLES) 32G X 5 MM MISC Use to inject insulin 2 times daily as instructed. 190 each 1  . insulin regular human CONCENTRATED (HUMULIN R U-500 KWIKPEN) 500 UNIT/ML kwikpen Inject 100 units under the skin before breakfast and 85units before dinner. 18 pen 1  . loperamide (IMODIUM A-D) 2 MG tablet Take 2 mg by mouth daily.    Marland Kitchen losartan (COZAAR) 100 MG tablet Take 1 tablet (100 mg total) by mouth daily. 90 tablet 3  . nitroGLYCERIN (NITROSTAT) 0.4 MG SL tablet Place 1 tablet (0.4 mg total) under the tongue every 5 (five) minutes as needed for chest pain. 25 tablet 3  . NONFORMULARY OR COMPOUNDED ITEM Neuropathy cream: mix Amitriptyline 2%, Lidocaine  2%, and Ketamine 1% - apply on feet 2x a day [facility]. 1 each 2  . nystatin cream (MYCOSTATIN) Apply 1 application topically 2 (two) times daily.    Marland Kitchen omega-3 acid ethyl esters (LOVAZA) 1 G capsule Take 1 capsule (1 g total) by mouth 2 (two) times daily. 60 capsule 11  . ondansetron (ZOFRAN) 8 MG tablet Take 8 mg by mouth every 8  (eight) hours as needed for nausea or vomiting.    Glory Rosebush DELICA LANCETS FINE MISC Use to test blood sugar 2 times daily as instructed. Dx: E11.41 200 each 1  . rosuvastatin (CRESTOR) 20 MG tablet Take 1 tablet (20 mg total) by mouth daily. 90 tablet 3  . ruxolitinib phosphate (JAKAFI) 25 MG tablet Take 1 tablet (25 mg total) by mouth 2 (two) times daily. 60 tablet 3  . ticagrelor (BRILINTA) 90 MG TABS tablet Take 1 tablet (90 mg total) by mouth 2 (two) times daily. 60 tablet 5   No current facility-administered medications for this visit.    PHYSICAL EXAMINATION: ECOG PERFORMANCE STATUS: 1 - Symptomatic but completely ambulatory  Filed Vitals:   11/20/15 0837  BP: 180/70  Pulse: 95  Temp: 97.6 F (36.4 C)  Resp: 18   Filed Weights   11/20/15 0837  Weight: 220 lb 1.6 oz (99.837 kg)    GENERAL:alert, no distress and comfortable. She is morbidly obese SKIN: there are significant rashes which corresponded to the area of exposure from blood draw  EYES: normal, Conjunctiva are pink and non-injected, sclera clear OROPHARYNX:no exudate, no erythema and lips, buccal mucosa, and tongue normal  NECK: supple, thyroid normal size, non-tender, without nodularity LYMPH:  no palpable lymphadenopathy in the cervical, axillary or inguinal LUNGS: clear to auscultation and percussion with normal breathing effort HEART: regular rate & rhythm and no murmurs and no lower extremity edema ABDOMEN:abdomen soft, non-tender and normal bowel sounds Musculoskeletal:no cyanosis of digits and no clubbing . She has mild pain on deep palpation over the left rib cage area NEURO: alert & oriented x 3 with fluent speech, no focal motor/sensory deficits  LABORATORY DATA:  I have reviewed the data as listed    Component Value Date/Time   NA 132* 11/20/2015 0809   NA 144 02/04/2015 0411   K 4.1 11/20/2015 0809   K 3.8 02/04/2015 0411   CL 104 02/04/2015 0411   CL 103 02/16/2013 0900   CO2 19* 11/20/2015  0809   CO2 30 02/04/2015 0411   GLUCOSE 618* 11/20/2015 0809   GLUCOSE 109* 02/04/2015 0411   GLUCOSE 396* 02/16/2013 0900   GLUCOSE 292 06/02/2010   BUN 21.3 11/20/2015 0809   BUN 37* 02/04/2015 0411   CREATININE 2.8* 11/20/2015 0809   CREATININE 1.07 02/04/2015 0411   CALCIUM 8.5 11/20/2015 0809   CALCIUM 8.9 02/04/2015 0411   PROT 6.3* 11/20/2015 0809   PROT 6.2 01/27/2015 1320   ALBUMIN 3.5 11/20/2015 0809   ALBUMIN 3.5 01/27/2015 1320   AST 25 11/20/2015 0809   AST 68* 01/27/2015 1320   ALT 14 11/20/2015 0809   ALT 18 01/27/2015 1320   ALKPHOS 109 11/20/2015 0809   ALKPHOS 80 01/27/2015 1320   BILITOT 0.56 11/20/2015 0809   BILITOT 1.2 01/27/2015 1320   GFRNONAA 49* 02/04/2015 0411   GFRAA 57* 02/04/2015 0411    No results found for: SPEP, UPEP  Lab Results  Component Value Date   WBC 17.4* 11/20/2015   NEUTROABS 13.7* 11/20/2015   HGB  13.4 11/20/2015   HCT 42.4 11/20/2015   MCV 88.7 11/20/2015   PLT 556* 11/20/2015      Chemistry      Component Value Date/Time   NA 132* 11/20/2015 0809   NA 144 02/04/2015 0411   K 4.1 11/20/2015 0809   K 3.8 02/04/2015 0411   CL 104 02/04/2015 0411   CL 103 02/16/2013 0900   CO2 19* 11/20/2015 0809   CO2 30 02/04/2015 0411   BUN 21.3 11/20/2015 0809   BUN 37* 02/04/2015 0411   CREATININE 2.8* 11/20/2015 0809   CREATININE 1.07 02/04/2015 0411      Component Value Date/Time   CALCIUM 8.5 11/20/2015 0809   CALCIUM 8.9 02/04/2015 0411   ALKPHOS 109 11/20/2015 0809   ALKPHOS 80 01/27/2015 1320   AST 25 11/20/2015 0809   AST 68* 01/27/2015 1320   ALT 14 11/20/2015 0809   ALT 18 01/27/2015 1320   BILITOT 0.56 11/20/2015 0809   BILITOT 1.2 01/27/2015 1320      ASSESSMENT & PLAN:  [name] She has missed several days of prescription due to recent cold. I reinforced the importance of compliance. Thankfully, her blood count today is a little better compared to a month ago. I will continue the same dose of  treatment and we'll see her back in 1 month for further evaluation  Essential hypertension Her blood pressure today is very high. The patient did not take any of her blood pressure pills this morning. Again, I reinforced the importance of compliance  Rash, skin She has significant skin rash on both arms from tape. I will alert the lab technician area to make sure that they will not use latex-containing products  Type 2 diabetes mellitus with neurological manifestations, uncontrolled She has uncontrolled high blood sugar today. I recommend she follow-up with PCP and start taking all her medications that she missed this morning and recheck her blood sugar in 2 hours  Rib pain on left side She has left rib pain from coughing. She is recovering from a cold. I recommend conservative management.        All questions were answered. The patient knows to call the clinic with any problems, questions or concerns. No barriers to learning was detected. I spent 20 minutes counseling the patient face to face. The total time spent in the appointment was 25 minutes and more than 50% was on counseling and review of test results     [facility], [place], MD 11/20/2015 8:58 AM

## 2015-11-20 NOTE — Telephone Encounter (Signed)
Received CMET results as pt leaving office this morning.  Dr. Alvy Bimler notified of pt's Blood Sugar 618 this morning.  Dr. Alvy Bimler says pt did not take any of her meds this morning.  She needs to go home and take her diabetic meds and insulin as directed and drink plenty of fluids.   I s/w pt actually outside as she was waiting for her car.  Gave copy of labs.  Instructed pt to check blood sugar as soon as she gets home and take her insulin as directed by her PCP.  Instructed pt to recheck her sugar one hour later and call her PCP if still over 300.  Instructed pt to drink plenty of fluids and keep close check on blood sugars.  Contact PCP to manage.  She verbalized understanding.   Called pt later and left VM for her to return call to nurse.  Checking on her blood sugar now.

## 2015-11-20 NOTE — Telephone Encounter (Signed)
Reviewed Dr Calton Dach note. Stay well hydrated and take her regular dose of insulin tonight, drink plenty of water, have a light supper >> check again at bedtime.

## 2015-11-20 NOTE — Telephone Encounter (Signed)
Pt called and reported blood sugar 325 earlier.  In reviewing the chart she had elevated blood sugars today.  Had not taken her medication.  She was instructed earlier today to take her pm dose of insulin, stay hydrated, eat a light supper and recheck her blood sugar at bedtime.  She has taken her pm dose if insulin.  Blood sugar last check - 224.  Will continue to stay hydrated, eat and recheck sugar as instructed by her endocrinologist.

## 2015-11-20 NOTE — Assessment & Plan Note (Signed)
She has missed several days of prescription due to recent cold. I reinforced the importance of compliance. Thankfully, her blood count today is a little better compared to a month ago. I will continue the same dose of treatment and we'll see her back in 1 month for further evaluation

## 2015-11-20 NOTE — Assessment & Plan Note (Signed)
Her blood pressure today is very high. The patient did not take any of her blood pressure pills this morning. Again, I reinforced the importance of compliance

## 2015-11-20 NOTE — Assessment & Plan Note (Signed)
She has uncontrolled high blood sugar today. I recommend she follow-up with PCP and start taking all her medications that she missed this morning and recheck her blood sugar in 2 hours

## 2015-11-20 NOTE — Assessment & Plan Note (Signed)
She has significant skin rash on both arms from tape. I will alert the lab technician area to make sure that they will not use latex-containing products

## 2015-11-20 NOTE — Assessment & Plan Note (Signed)
She has left rib pain from coughing. She is recovering from a cold. I recommend conservative management.

## 2015-11-20 NOTE — Telephone Encounter (Signed)
per pof to sch pt appt-gave pt copy of avs °

## 2015-11-20 NOTE — Telephone Encounter (Signed)
Noted, sugars coming down. Thank you!

## 2015-11-21 ENCOUNTER — Telehealth: Payer: Self-pay | Admitting: Internal Medicine

## 2015-11-21 NOTE — Telephone Encounter (Signed)
We talked to pt yesterday pm. Her sugars was in the 200s last night after she took the last dose of insulin of the day.  Earl Lagos, can you please check on her today?

## 2015-11-21 NOTE — Telephone Encounter (Signed)
Lm on pts vm requesting a call back

## 2015-11-21 NOTE — Telephone Encounter (Signed)
Patient Name: Amanda [name] Gender: Female DOB: October 12, 1941 Age: 75 Y 2 M 20 D Return Phone Number: [phone] (Primary) Address: City/State/Zip: [place] Client [facility] Night - Client Client Site [facility] Physician [name] Contact Type Call Call Type Triage / Clinical Caller Name [name] Relationship To Patient Spouse Return Phone Number [phone] (Primary) Chief Complaint Blood Sugar High Initial Comment Caller's wife had high blood sugar of 600 plus earlier and after insulin it went down to 300 plus. What should the pt do to control the high blood sugar? PreDisposition Call Doctor Nurse Assessment Nurse: Donovan Kail, RN, Barnetta Chapel Date/Time Eilene Ghazi Time): 11/20/2015 5:09:58 PM Confirm and document reason for call. If symptomatic, describe symptoms. You must click the next button to save text entered. ---Caller's wife had high blood sugar of 600 plus earlier and after insulin it went down to 300 plus. She has been sick. Humalin R 100 ul in am. 85 in the evening. Has the patient traveled out of the country within the last 30 days? ---Not Applicable Does the patient have any new or worsening symptoms? ---Yes Will a triage be completed? ---Yes Related visit to physician within the last 2 weeks? ---Yes Does the PT have any chronic conditions? (i.e. diabetes, asthma, etc.) ---Yes List chronic conditions. ---diabetes, HTN, high cholesterol, too many blood cells. Is this a behavioral health or substance abuse call? ---No Guidelines Guideline Title Affirmed Question Affirmed Notes Nurse Date/Time (Eastern Time) Diabetes - High Blood Sugar [1] Blood glucose > 300 mg/dl (16.5 mmol/l) AND [2] two or more times in a row [place], Paediatric nurse 11/20/2015 5:10:11 PM

## 2015-11-24 ENCOUNTER — Ambulatory Visit: Payer: Medicare Other | Admitting: Gastroenterology

## 2015-11-26 ENCOUNTER — Telehealth: Payer: Self-pay | Admitting: Internal Medicine

## 2015-11-26 NOTE — Telephone Encounter (Signed)
Pt needs [place] to send rx for insulin pens to [facility] please, she is attempting to see if it will be cheaper there

## 2015-11-27 ENCOUNTER — Other Ambulatory Visit: Payer: Self-pay | Admitting: *Deleted

## 2015-11-27 MED ORDER — INSULIN REGULAR HUMAN (CONC) 500 UNIT/ML ~~LOC~~ SOPN: PEN_INJECTOR | SUBCUTANEOUS | Status: DC

## 2015-11-27 MED ORDER — INSULIN PEN NEEDLE 32G X 5 MM MISC: Status: DC

## 2015-11-27 NOTE — Telephone Encounter (Signed)
Called pt to verify which [facility]. He advised the one at [address] [facility] [address]. Sent.

## 2015-12-03 ENCOUNTER — Telehealth: Payer: Self-pay

## 2015-12-03 ENCOUNTER — Other Ambulatory Visit: Payer: Self-pay

## 2015-12-03 DIAGNOSIS — I10 Essential (primary) hypertension: Secondary | ICD-10-CM

## 2015-12-03 MED ORDER — LOSARTAN POTASSIUM 100 MG PO TABS: 100.0000 mg | ORAL_TABLET | Freq: Every day | ORAL | Status: DC

## 2015-12-03 NOTE — Telephone Encounter (Signed)
Yes, okay to send 14 day supply to local pharmacy.

## 2015-12-15 ENCOUNTER — Encounter: Payer: Self-pay | Admitting: Family Medicine

## 2015-12-15 ENCOUNTER — Ambulatory Visit (INDEPENDENT_AMBULATORY_CARE_PROVIDER_SITE_OTHER): Payer: Medicare Other | Admitting: Family Medicine

## 2015-12-15 VITALS — BP 163/70 | HR 79 | Wt 225.0 lb

## 2015-12-15 DIAGNOSIS — L0889 Other specified local infections of the skin and subcutaneous tissue: Secondary | ICD-10-CM

## 2015-12-15 DIAGNOSIS — L089 Local infection of the skin and subcutaneous tissue, unspecified: Secondary | ICD-10-CM

## 2015-12-15 MED ORDER — SULFAMETHOXAZOLE-TRIMETHOPRIM 800-160 MG PO TABS: 1.0000 | ORAL_TABLET | Freq: Two times a day (BID) | ORAL | Status: DC

## 2015-12-15 NOTE — Progress Notes (Signed)
   Subjective:    Patient ID: Amanda [name], female    DOB: [birth year], 75 y.o.   MRN: [id number]  HPI 5 days ago noticed a bump on the palm of her left hand.  She has been putting neosporin. Now larger nad is tender. It did drain a little. She had a similar rash about 30 years ago and was told it was some form of herpes.  No fevers chills or sweats.No worsening or alleviating factors.   Review of Systems     Objective:   Physical Exam  Constitutional: She is oriented to person, place, and time. She appears well-developed and well-nourished.  HENT:  Head: Normocephalic and atraumatic.  Neurological: She is alert and oriented to person, place, and time.  Skin: Skin is warm.  At the base of her palm near her wrist on her left hand she has a very large approximately 1-1/2 cm pustule with some purple discoloration around the border.  Psychiatric: She has a normal mood and affect. Her behavior is normal.          Assessment & Plan:  Pustule-unclear etiology. She doesn't remember any trauma or injury. Area lanced and bacterial culture sent as well as viral culture sent with prior history of herpes simplex. The go ahead and start her on Bactrim until the bacterial culture returns.

## 2015-12-16 ENCOUNTER — Telehealth: Payer: Self-pay | Admitting: *Deleted

## 2015-12-16 NOTE — Telephone Encounter (Signed)
Return call in from patient.  "I received a call at 1301.  If this was about my appointment tomorrow do not call me back I already know."  Call forwarded to collaborative who may have called.

## 2015-12-17 ENCOUNTER — Telehealth: Payer: Self-pay | Admitting: Hematology and Oncology

## 2015-12-17 ENCOUNTER — Other Ambulatory Visit (HOSPITAL_BASED_OUTPATIENT_CLINIC_OR_DEPARTMENT_OTHER): Payer: Medicare Other

## 2015-12-17 ENCOUNTER — Telehealth: Payer: Self-pay | Admitting: *Deleted

## 2015-12-17 ENCOUNTER — Other Ambulatory Visit: Payer: Self-pay | Admitting: Hematology and Oncology

## 2015-12-17 DIAGNOSIS — D45 Polycythemia vera: Secondary | ICD-10-CM

## 2015-12-17 LAB — CBC WITH DIFFERENTIAL/PLATELET
BASO%: 0.5 % (ref 0.0–2.0)
BASOS ABS: 0.1 10*3/uL (ref 0.0–0.1)
EOS ABS: 0 10*3/uL (ref 0.0–0.5)
EOS%: 0 % (ref 0.0–7.0)
HEMATOCRIT: 40 % (ref 34.8–46.6)
HEMOGLOBIN: 13.1 g/dL (ref 11.6–15.9)
LYMPH#: 3.8 10*3/uL — AB (ref 0.9–3.3)
LYMPH%: 23.3 % (ref 14.0–49.7)
MCH: 28.1 pg (ref 25.1–34.0)
MCHC: 32.8 g/dL (ref 31.5–36.0)
MCV: 85.8 fL (ref 79.5–101.0)
MONO#: 0.8 10*3/uL (ref 0.1–0.9)
MONO%: 5 % (ref 0.0–14.0)
NEUT#: 11.6 10*3/uL — ABNORMAL HIGH (ref 1.5–6.5)
NEUT%: 71.2 % (ref 38.4–76.8)
PLATELETS: 408 10*3/uL — AB (ref 145–400)
RBC: 4.66 10*6/uL (ref 3.70–5.45)
RDW: 16 % — ABNORMAL HIGH (ref 11.2–14.5)
WBC: 16.3 10*3/uL — ABNORMAL HIGH (ref 3.9–10.3)
nRBC: 0 % (ref 0–0)

## 2015-12-17 LAB — HERPES SIMPLEX VIRUS CULTURE: ORGANISM ID, BACTERIA: DETECTED

## 2015-12-17 MED ORDER — VALACYCLOVIR HCL 1 G PO TABS: 1000.0000 mg | ORAL_TABLET | Freq: Two times a day (BID) | ORAL | Status: DC

## 2015-12-17 NOTE — Telephone Encounter (Signed)
Informed pt of Lab results today are stable per Dr. Alvy Bimler.  If pt doing ok then she can r/s to see pt and have lab again on same day in one month.  Pt states she doing ok.  She has not had chance to go to the [facility] yet.  Says any day of week works for her as long as it is early morning.  Instructed pt to not come in tomorrow.  She will get a call from Scheduler to return in one month.  She verbalized understanding.

## 2015-12-17 NOTE — Telephone Encounter (Signed)
per pfo to sch pt appt-gave pt ocpyof avs

## 2015-12-17 NOTE — Addendum Note (Signed)
Addended by: Beatrice Lecher D on: 12/17/2015 02:24 PM   Modules accepted: Orders

## 2015-12-18 ENCOUNTER — Ambulatory Visit: Payer: Medicare Other | Admitting: Hematology and Oncology

## 2015-12-18 ENCOUNTER — Other Ambulatory Visit: Payer: Medicare Other

## 2015-12-18 LAB — WOUND CULTURE
Gram Stain: NONE SEEN
Gram Stain: NONE SEEN
Organism ID, Bacteria: NO GROWTH

## 2016-01-09 ENCOUNTER — Telehealth: Payer: Self-pay | Admitting: *Deleted

## 2016-01-09 ENCOUNTER — Encounter (HOSPITAL_COMMUNITY): Payer: Self-pay | Admitting: Emergency Medicine

## 2016-01-09 ENCOUNTER — Emergency Department (HOSPITAL_COMMUNITY)
Admission: EM | Admit: 2016-01-09 | Discharge: 2016-01-09 | Disposition: A | Discharge status: Home / Self Care | Payer: Medicare Other | Attending: Emergency Medicine | Admitting: Emergency Medicine

## 2016-01-09 DIAGNOSIS — Z91040 Latex allergy status: Secondary | ICD-10-CM | POA: Insufficient documentation

## 2016-01-09 DIAGNOSIS — Z9889 Other specified postprocedural states: Secondary | ICD-10-CM | POA: Insufficient documentation

## 2016-01-09 DIAGNOSIS — I10 Essential (primary) hypertension: Secondary | ICD-10-CM | POA: Diagnosis not present

## 2016-01-09 DIAGNOSIS — Z7952 Long term (current) use of systemic steroids: Secondary | ICD-10-CM | POA: Insufficient documentation

## 2016-01-09 DIAGNOSIS — Z7982 Long term (current) use of aspirin: Secondary | ICD-10-CM | POA: Diagnosis not present

## 2016-01-09 DIAGNOSIS — E119 Type 2 diabetes mellitus without complications: Secondary | ICD-10-CM | POA: Insufficient documentation

## 2016-01-09 DIAGNOSIS — Z8719 Personal history of other diseases of the digestive system: Secondary | ICD-10-CM | POA: Insufficient documentation

## 2016-01-09 DIAGNOSIS — Z8659 Personal history of other mental and behavioral disorders: Secondary | ICD-10-CM | POA: Insufficient documentation

## 2016-01-09 DIAGNOSIS — Z862 Personal history of diseases of the blood and blood-forming organs and certain disorders involving the immune mechanism: Secondary | ICD-10-CM | POA: Insufficient documentation

## 2016-01-09 DIAGNOSIS — Z85828 Personal history of other malignant neoplasm of skin: Secondary | ICD-10-CM | POA: Insufficient documentation

## 2016-01-09 DIAGNOSIS — Z8669 Personal history of other diseases of the nervous system and sense organs: Secondary | ICD-10-CM | POA: Diagnosis not present

## 2016-01-09 DIAGNOSIS — Z872 Personal history of diseases of the skin and subcutaneous tissue: Secondary | ICD-10-CM | POA: Diagnosis not present

## 2016-01-09 DIAGNOSIS — Z87891 Personal history of nicotine dependence: Secondary | ICD-10-CM | POA: Diagnosis not present

## 2016-01-09 DIAGNOSIS — E669 Obesity, unspecified: Secondary | ICD-10-CM | POA: Insufficient documentation

## 2016-01-09 DIAGNOSIS — E785 Hyperlipidemia, unspecified: Secondary | ICD-10-CM | POA: Diagnosis not present

## 2016-01-09 DIAGNOSIS — Z79899 Other long term (current) drug therapy: Secondary | ICD-10-CM | POA: Insufficient documentation

## 2016-01-09 DIAGNOSIS — Z8619 Personal history of other infectious and parasitic diseases: Secondary | ICD-10-CM | POA: Diagnosis not present

## 2016-01-09 DIAGNOSIS — Z794 Long term (current) use of insulin: Secondary | ICD-10-CM | POA: Insufficient documentation

## 2016-01-09 DIAGNOSIS — R21 Rash and other nonspecific skin eruption: Secondary | ICD-10-CM

## 2016-01-09 DIAGNOSIS — I252 Old myocardial infarction: Secondary | ICD-10-CM | POA: Insufficient documentation

## 2016-01-09 LAB — PROTIME-INR
INR: 1.2 (ref 0.00–1.49)
PROTHROMBIN TIME: 14.9 s (ref 11.6–15.2)

## 2016-01-09 LAB — CBC WITH DIFFERENTIAL/PLATELET
BLASTS: 0 %
Band Neutrophils: 0 %
Basophils Absolute: 0 10*3/uL (ref 0.0–0.1)
Basophils Relative: 0 %
EOS PCT: 2 %
Eosinophils Absolute: 0.3 10*3/uL (ref 0.0–0.7)
HEMATOCRIT: 35.8 % — AB (ref 36.0–46.0)
HEMOGLOBIN: 11.9 g/dL — AB (ref 12.0–15.0)
LYMPHS ABS: 2.8 10*3/uL (ref 0.7–4.0)
Lymphocytes Relative: 17 %
MCH: 28.5 pg (ref 26.0–34.0)
MCHC: 33.2 g/dL (ref 30.0–36.0)
MCV: 85.6 fL (ref 78.0–100.0)
MYELOCYTES: 0 %
Metamyelocytes Relative: 0 %
Monocytes Absolute: 1.2 10*3/uL — ABNORMAL HIGH (ref 0.1–1.0)
Monocytes Relative: 7 %
NEUTROS PCT: 74 %
NRBC: 2 /100{WBCs} — AB
Neutro Abs: 12.3 10*3/uL — ABNORMAL HIGH (ref 1.7–7.7)
Other: 0 %
Platelets: 429 10*3/uL — ABNORMAL HIGH (ref 150–400)
Promyelocytes Absolute: 0 %
RBC: 4.18 MIL/uL (ref 3.87–5.11)
RDW: 17.8 % — ABNORMAL HIGH (ref 11.5–15.5)
WBC: 16.6 10*3/uL — AB (ref 4.0–10.5)

## 2016-01-09 LAB — COMPREHENSIVE METABOLIC PANEL
ALBUMIN: 4.1 g/dL (ref 3.5–5.0)
ALT: 31 U/L (ref 14–54)
AST: 63 U/L — AB (ref 15–41)
Alkaline Phosphatase: 115 U/L (ref 38–126)
Anion gap: 11 (ref 5–15)
BUN: 17 mg/dL (ref 6–20)
CHLORIDE: 110 mmol/L (ref 101–111)
CO2: 18 mmol/L — AB (ref 22–32)
CREATININE: 0.97 mg/dL (ref 0.44–1.00)
Calcium: 9.3 mg/dL (ref 8.9–10.3)
GFR calc Af Amer: >60 mL/min (ref >60)
GFR calc non Af Amer: 55 mL/min — ABNORMAL LOW (ref >60)
GLUCOSE: 160 mg/dL — AB (ref 65–99)
POTASSIUM: 4.6 mmol/L (ref 3.5–5.1)
SODIUM: 139 mmol/L (ref 135–145)
Total Bilirubin: 0.9 mg/dL (ref 0.3–1.2)
Total Protein: 6.9 g/dL (ref 6.5–8.1)

## 2016-01-09 LAB — APTT: APTT: 32 s (ref 24–37)

## 2016-01-09 NOTE — Discharge Instructions (Signed)
Return here if you experience bleeding that does not stop, extension of your rash, fever, or any other problems. Follow-up with Dr. Alvy Bimler on Monday

## 2016-01-09 NOTE — ED Notes (Signed)
Attempted blood re draw in right [facility], was unsuccessful, RN notified.

## 2016-01-09 NOTE — ED Provider Notes (Signed)
CSN: [id number]     Arrival date & time 01/09/16  1556 History   First MD Initiated Contact with Patient 01/09/16 1714     Chief Complaint  Patient presents with  . Rash     (Consider location/radiation/quality/duration/timing/severity/associated sxs/prior Treatment) HPI Comments: Patient here with rash to her hands and upper extremities 2 days. Notes that the started office brown areas that then developed into purplish lesions which didn't bleed. Denies any oral mucosal involvement. Denies any rashes on the rest of her body. No changes to her medications. She does have a history of polycythemia. Symptoms have been progressively worse and nothing makes them better. No treatment used prior to arrival. No prior history of same  Patient is a 75 y.o. female presenting with rash. The history is provided by the patient and the spouse.  Rash   Past Medical History  Diagnosis Date  . Hypertension   . Hyperlipidemia   . Diabetes mellitus 2008  . Depression   . Obesity   . Psoriasis   . Polycythemia     Dr. Elease Hashimoto- [facility]  . Herpes simplex   . Gout   . Hyperglycemia 05/31/2013  . C. difficile colitis   . Cirrhosis ([place])   . Cancer ([place])     SKIN  . Neuromuscular disorder ([place])     BELL PALSY  . Candidiasis of skin 09/30/2014  . Myocardial infarction [facility])    Past Surgical History  Procedure Laterality Date  . Breast surgery Left     milk duct  . Total abdominal hysterectomy w/ bilateral salpingoophorectomy      for heavy periods with appendectomy  . Tonsillectomy    . Colonoscopy    . Left heart catheterization with coronary angiogram N/A 01/27/2015    Procedure: LEFT HEART CATHETERIZATION WITH CORONARY ANGIOGRAM;  Surgeon: [name], MD;  Location: [facility] CATH LAB;  Service: Cardiovascular;  Laterality: N/A;   Family History  Problem Relation Age of Onset  . Diabetes Mother   . Heart disease Mother     CAD  . Hyperlipidemia Mother   . Hypertension Mother   .  Kidney disease Mother   . Kidney disease Father   . Breast cancer Maternal Aunt   . Heart disease Maternal Grandmother   . Heart disease Other     maternal aunts and uncles   Social History  Substance Use Topics  . Smoking status: Former Smoker -- 48 years    Types: Cigarettes    Quit date: 10/18/1986  . Smokeless tobacco: Never Used  . Alcohol Use: No   OB History    No data available     Review of Systems  Skin: Positive for rash.  All other systems reviewed and are negative.     Allergies  Lisinopril; Tape; Doxycycline; and Latex  Home Medications   Prior to Admission medications   Medication Sig Start Date End Date Taking? Authorizing Provider  dicyclomine (BENTYL) 10 MG capsule Take 1 tab by mouth every morning, may take twice daily as needed. 05/15/15  Yes Amy S Esterwood, PA-C  hydrochlorothiazide (MICROZIDE) 12.5 MG capsule Take 1 capsule (12.5 mg total) by mouth daily. 02/26/15  Yes Brittainy Erie Noe, PA-C  hydrocortisone 2.5 % cream Apply 1 application topically 2 (two) times daily.   Yes Historical Provider, MD  insulin regular human CONCENTRATED (HUMULIN R U-500 KWIKPEN) 500 UNIT/ML kwikpen Inject 100 units under the skin before breakfast and 85units before dinner. Patient taking differently: Inject 100  units under the skin before breakfast and 80 units before dinner. 11/27/15  Yes [name], MD  loperamide (IMODIUM A-D) 2 MG tablet Take 2 mg by mouth daily as needed for diarrhea or loose stools.    Yes Historical Provider, MD  losartan (COZAAR) 100 MG tablet Take 1 tablet (100 mg total) by mouth daily. 12/03/15  Yes Hali Marry, MD  nitroGLYCERIN (NITROSTAT) 0.4 MG SL tablet Place 1 tablet (0.4 mg total) under the tongue every 5 (five) minutes as needed for chest pain. 02/06/15  Yes [name], MD  NONFORMULARY OR COMPOUNDED ITEM Neuropathy cream: mix Amitriptyline 2%, Lidocaine 2%, and Ketamine 1% - apply on feet 2x a day [facility].  01/23/15  Yes [name], MD  nystatin cream (MYCOSTATIN) Apply 1 application topically 2 (two) times daily.   Yes Historical Provider, MD  omega-3 acid ethyl esters (LOVAZA) 1 G capsule Take 1 capsule (1 g total) by mouth 2 (two) times daily. 02/07/15  Yes [name], MD  ondansetron (ZOFRAN) 8 MG tablet Take 8 mg by mouth every 8 (eight) hours as needed for nausea or vomiting.   Yes Historical Provider, MD  rosuvastatin (CRESTOR) 20 MG tablet Take 1 tablet (20 mg total) by mouth daily. 05/12/15  Yes [name], MD  ruxolitinib phosphate (JAKAFI) 25 MG tablet Take 1 tablet (25 mg total) by mouth 2 (two) times daily. 10/16/15  Yes [name], MD  ticagrelor (BRILINTA) 90 MG TABS tablet Take 1 tablet (90 mg total) by mouth 2 (two) times daily. 02/06/15  Yes [name], MD  aspirin EC 81 MG tablet Take 81 mg by mouth daily.    Historical Provider, MD  glucose blood (ONETOUCH VERIO) test strip Use to test blood sugar 2 times daily as instructed. Dx: E11.41 07/01/15   [name], MD  Insulin Pen Needle (CAREFINE PEN NEEDLES) 32G X 5 MM MISC Use to inject insulin 2 times daily as instructed. 11/27/15   [name], MD  [facility] DELICA LANCETS FINE MISC Use to test blood sugar 2 times daily as instructed. Dx: E11.41 07/07/15   [name], MD  valACYclovir (VALTREX) 1000 MG tablet Take 1 tablet (1,000 mg total) by mouth 2 (two) times daily. X 10 days Patient not taking: Reported on 01/09/2016 12/17/15   Hali Marry, MD   BP 163/66 mmHg  Pulse 92  Temp(Src) 97.8 F (36.6 C) (Oral)  Resp 18  SpO2 93% Physical Exam  Constitutional: She is oriented to person, place, and time. She appears well-developed and well-nourished.  Non-toxic appearance. No distress.  HENT:  Head: Normocephalic and atraumatic.  No evidence of mucosal involvement  Eyes: Conjunctivae, EOM and lids are normal. Pupils are equal, round, and reactive to light.  Neck: Normal range of motion. Neck  supple. No tracheal deviation present. No thyroid mass present.  Cardiovascular: Normal rate, regular rhythm and normal heart sounds.  Exam reveals no gallop.   No murmur heard. Pulmonary/Chest: Effort normal and breath sounds normal. No stridor. No respiratory distress. She has no decreased breath sounds. She has no wheezes. She has no rhonchi. She has no rales.  Abdominal: Soft. Normal appearance and bowel sounds are normal. She exhibits no distension. There is no tenderness. There is no rebound and no CVA tenderness.  Musculoskeletal: Normal range of motion. She exhibits no edema or tenderness.  Neurological: She is alert and oriented to person, place, and time. She has normal strength. No cranial nerve  deficit or sensory deficit. GCS eye subscore is 4. GCS verbal subscore is 5. GCS motor subscore is 6.  Skin: Skin is warm and dry. No abrasion and no rash noted.  Multiple brown flat circular lesions noted with some areas of purpura  Psychiatric: She has a normal mood and affect. Her speech is normal and behavior is normal.  Nursing note and vitals reviewed.   ED Course  Procedures (including critical care time) Labs Review Labs Reviewed  CBC WITH DIFFERENTIAL/PLATELET  COMPREHENSIVE METABOLIC PANEL  PROTIME-INR  APTT    Imaging Review No results found. I have personally reviewed and evaluated these images and lab results as part of my medical decision-making.   EKG Interpretation None      MDM   Final diagnoses:  None    Spoke with the oncologist on call, Dr. Jana Hakim, and he recommended no intervention at this time. He is unsure why the patient has purpura. Patient does not have a severely elevated hemoglobin. Platelets are only slightly elevated. Patient's INR is normal. She has no mucosal involvement at this time. Patient will be encouraged to follow-up with her oncologist and will be given return precautions   Lacretia Leigh, MD 01/09/16 2102

## 2016-01-09 NOTE — ED Notes (Signed)
Pt reports understanding of discharge information. No questions at time of discharge 

## 2016-01-09 NOTE — Telephone Encounter (Signed)
Call received in [place] from pt stating noted onset of bleeding from spots on hands.  Amanda [name] states she had areas on her arm ( seen by Dr Alvy Bimler )- then this week she noticed " speckles on my hands and then areas about the size of a 1/2 dollar broke out and now they are oozing"  Pt is cleaning areas well but is inquiring what to do.  She states the areas on the arm are " closed up except for one area ".  No spots or wounds on lower extremities.  She verified she is taking the [facility] as prescribed " but stopped the baby aspirin due to this bleeding "  This RN informed pt above will be sent to MD and RN at desk for appropriate follow up.  Return call number given per this call as [phone].

## 2016-01-09 NOTE — Telephone Encounter (Signed)
S/w pt regarding some sores bleeding on her hands.  (See Triage note from this morning.)  Relayed message to Dr. Alvy Bimler and she wants pt to see her PCP today to evaluate and also to make sure ok with PCP for pt to hold aspirin.  Informed pt to call PCP to assess and she says she will call them right now.

## 2016-01-09 NOTE — ED Notes (Signed)
Pt c/o darkened skin patches on bilateral posterior hands followed by small reddened areas of skin onset 2 days ago, last night two lesions on left distal arm began bleeding. No pain. No history of the same. To lesions or edema to face, mouth, or throat.

## 2016-01-21 ENCOUNTER — Telehealth: Payer: Self-pay | Admitting: *Deleted

## 2016-01-21 NOTE — Telephone Encounter (Signed)
TC from patient this am regarding upcoming appt on Friday, 01/23/16 for labs and Dr. Alvy Bimler. Pt states she was seen in the ED on 01/09/16 and, per pt,  was told she did not need additional lab work or MD appt this week. Pt iasking if her appts were cancelled or does she need to come in.

## 2016-01-21 NOTE — Telephone Encounter (Signed)
Pt called to confirm Lab appt canceled for this Friday but she will keep appt /w Dr. Alvy Bimler as scheduled at 8:30 am.

## 2016-01-21 NOTE — Telephone Encounter (Signed)
I cancelled it

## 2016-01-23 ENCOUNTER — Telehealth: Payer: Self-pay | Admitting: Hematology and Oncology

## 2016-01-23 ENCOUNTER — Ambulatory Visit (HOSPITAL_BASED_OUTPATIENT_CLINIC_OR_DEPARTMENT_OTHER): Payer: Medicare Other | Admitting: Hematology and Oncology

## 2016-01-23 ENCOUNTER — Encounter: Payer: Self-pay | Admitting: Hematology and Oncology

## 2016-01-23 ENCOUNTER — Other Ambulatory Visit: Payer: Medicare Other

## 2016-01-23 VITALS — BP 163/78 | HR 93 | Temp 97.7°F | Resp 19 | Wt 228.0 lb

## 2016-01-23 DIAGNOSIS — D45 Polycythemia vera: Secondary | ICD-10-CM

## 2016-01-23 DIAGNOSIS — J3089 Other allergic rhinitis: Secondary | ICD-10-CM

## 2016-01-23 DIAGNOSIS — J309 Allergic rhinitis, unspecified: Secondary | ICD-10-CM | POA: Insufficient documentation

## 2016-01-23 HISTORY — DX: Allergic rhinitis, unspecified: J30.9

## 2016-01-23 MED ORDER — PREDNISONE 20 MG PO TABS: 20.0000 mg | ORAL_TABLET | Freq: Every day | ORAL | Status: DC

## 2016-01-23 NOTE — Progress Notes (Signed)
[facility] OFFICE PROGRESS NOTE  Patient Care Team: Hali Marry, MD as PCP - General (Family Medicine) [name], MD as Consulting Physician (Hematology and Oncology) [name], MD as Consulting Physician (Endocrinology) [name], MD as Consulting Physician (Cardiology) Milus Banister, MD as Attending Physician (Gastroenterology)  SUMMARY OF ONCOLOGIC HISTORY:   [name] ([place])   05/31/2013 Initial Diagnosis [name]   06/27/2015 -  Chemotherapy She started [facility]   10/16/2015 Miscellaneous Dose of Jakafi is increased to 25 mg BID due to persistent thrombocytosis    INTERVAL HISTORY: Please see below for problem oriented charting. She returns for further follow-up. She complained of fatigue with mild cough and mild expiratory wheezes. She had recent skin rash due to infection, resolving Otherwise she tolerated treatment well.  REVIEW OF SYSTEMS:   Constitutional: Denies fevers, chills or abnormal weight loss Eyes: Denies blurriness of vision Ears, nose, mouth, throat, and face: Denies mucositis or sore throat Respiratory: Denies cough, dyspnea or wheezes Cardiovascular: Denies palpitation, chest discomfort or lower extremity swelling Gastrointestinal:  Denies nausea, heartburn or change in bowel habits Skin: Denies abnormal skin rashes Lymphatics: Denies new lymphadenopathy or easy bruising Neurological:Denies numbness, tingling or new weaknesses Behavioral/Psych: Mood is stable, no new changes  All other systems were reviewed with the patient and are negative.  I have reviewed the past medical history, past surgical history, social history and family history with the patient and they are unchanged from previous note.  ALLERGIES:  is allergic to lisinopril; tape; doxycycline; and latex.  MEDICATIONS:  Current Outpatient Prescriptions  Medication Sig Dispense Refill  . aspirin EC 81 MG tablet Take 81 mg by mouth daily.     Marland Kitchen dicyclomine (BENTYL) 10 MG capsule Take 1 tab by mouth every morning, may take twice daily as needed. 60 capsule 8  . glucose blood (ONETOUCH VERIO) test strip Use to test blood sugar 2 times daily as instructed. Dx: E11.41 200 each 3  . hydrochlorothiazide (MICROZIDE) 12.5 MG capsule Take 1 capsule (12.5 mg total) by mouth daily. 90 capsule 3  . hydrocortisone 2.5 % cream Apply 1 application topically 2 (two) times daily.    . Insulin Pen Needle (CAREFINE PEN NEEDLES) 32G X 5 MM MISC Use to inject insulin 2 times daily as instructed. 190 each 1  . insulin regular human CONCENTRATED (HUMULIN R U-500 KWIKPEN) 500 UNIT/ML kwikpen Inject 100 units under the skin before breakfast and 85units before dinner. (Patient taking differently: Inject 100 units under the skin before breakfast and 80 units before dinner.) 18 pen 1  . loperamide (IMODIUM A-D) 2 MG tablet Take 2 mg by mouth daily as needed for diarrhea or loose stools.     Marland Kitchen losartan (COZAAR) 100 MG tablet Take 1 tablet (100 mg total) by mouth daily. 14 tablet 0  . nitroGLYCERIN (NITROSTAT) 0.4 MG SL tablet Place 1 tablet (0.4 mg total) under the tongue every 5 (five) minutes as needed for chest pain. 25 tablet 3  . NONFORMULARY OR COMPOUNDED ITEM Neuropathy cream: mix Amitriptyline 2%, Lidocaine 2%, and Ketamine 1% - apply on feet 2x a day [facility]. 1 each 2  . nystatin cream (MYCOSTATIN) Apply 1 application topically 2 (two) times daily.    Marland Kitchen omega-3 acid ethyl esters (LOVAZA) 1 G capsule Take 1 capsule (1 g total) by mouth 2 (two) times daily. 60 capsule 11  . ondansetron (ZOFRAN) 8 MG tablet Take 8 mg by mouth every 8 (eight)  hours as needed for nausea or vomiting.    Glory Rosebush DELICA LANCETS FINE MISC Use to test blood sugar 2 times daily as instructed. Dx: E11.41 200 each 1  . rosuvastatin (CRESTOR) 20 MG tablet Take 1 tablet (20 mg total) by mouth daily. 90 tablet 3  . ruxolitinib phosphate (JAKAFI) 25 MG tablet Take 1 tablet  (25 mg total) by mouth 2 (two) times daily. 60 tablet 3  . ticagrelor (BRILINTA) 90 MG TABS tablet Take 1 tablet (90 mg total) by mouth 2 (two) times daily. 60 tablet 5  . valACYclovir (VALTREX) 1000 MG tablet Take 1 tablet (1,000 mg total) by mouth 2 (two) times daily. X 10 days 20 tablet PRN  . predniSONE (DELTASONE) 20 MG tablet Take 1 tablet (20 mg total) by mouth daily with breakfast. 7 tablet 0   No current facility-administered medications for this visit.    PHYSICAL EXAMINATION: ECOG PERFORMANCE STATUS: 1 - Symptomatic but completely ambulatory  Filed Vitals:   01/23/16 0840  BP: 163/78  Pulse: 93  Temp: 97.7 F (36.5 C)  Resp: 19   Filed Weights   01/23/16 0840  Weight: 228 lb (103.42 kg)    GENERAL:alert, no distress and comfortable. She is morbidly obese SKIN: skin color, texture, turgor are normal, no rashes or significant lesions EYES: normal, Conjunctiva are pink and non-injected, sclera clear OROPHARYNX:no exudate, no erythema and lips, buccal mucosa, and tongue normal  NECK: supple, thyroid normal size, non-tender, without nodularity LYMPH:  no palpable lymphadenopathy in the cervical, axillary or inguinal LUNGS: She has mildly increased breathing effort with expiratory wheezes HEART: regular rate & rhythm and no murmurs and no lower extremity edema ABDOMEN:abdomen soft, non-tender and normal bowel sounds Musculoskeletal:no cyanosis of digits and no clubbing  NEURO: alert & oriented x 3 with fluent speech, no focal motor/sensory deficits  LABORATORY DATA:  I have reviewed the data as listed    Component Value Date/Time   NA 139 01/09/2016 1732   NA 132* 11/20/2015 0809   K 4.6 01/09/2016 1732   K 4.1 11/20/2015 0809   CL 110 01/09/2016 1732   CL 103 02/16/2013 0900   CO2 18* 01/09/2016 1732   CO2 19* 11/20/2015 0809   GLUCOSE 160* 01/09/2016 1732   GLUCOSE 618* 11/20/2015 0809   GLUCOSE 396* 02/16/2013 0900   GLUCOSE 292 06/02/2010   BUN 17  01/09/2016 1732   BUN 21.3 11/20/2015 0809   CREATININE 0.97 01/09/2016 1732   CREATININE 2.8* 11/20/2015 0809   CALCIUM 9.3 01/09/2016 1732   CALCIUM 8.5 11/20/2015 0809   PROT 6.9 01/09/2016 1732   PROT 6.3* 11/20/2015 0809   ALBUMIN 4.1 01/09/2016 1732   ALBUMIN 3.5 11/20/2015 0809   AST 63* 01/09/2016 1732   AST 25 11/20/2015 0809   ALT 31 01/09/2016 1732   ALT 14 11/20/2015 0809   ALKPHOS 115 01/09/2016 1732   ALKPHOS 109 11/20/2015 0809   BILITOT 0.9 01/09/2016 1732   BILITOT 0.56 11/20/2015 0809   GFRNONAA 55* 01/09/2016 1732   GFRAA >60 01/09/2016 1732    No results found for: SPEP, UPEP  Lab Results  Component Value Date   WBC 16.6* 01/09/2016   NEUTROABS 12.3* 01/09/2016   HGB 11.9* 01/09/2016   HCT 35.8* 01/09/2016   MCV 85.6 01/09/2016   PLT 429* 01/09/2016      Chemistry      Component Value Date/Time   NA 139 01/09/2016 1732   NA 132* 11/20/2015 0809  K 4.6 01/09/2016 1732   K 4.1 11/20/2015 0809   CL 110 01/09/2016 1732   CL 103 02/16/2013 0900   CO2 18* 01/09/2016 1732   CO2 19* 11/20/2015 0809   BUN 17 01/09/2016 1732   BUN 21.3 11/20/2015 0809   CREATININE 0.97 01/09/2016 1732   CREATININE 2.8* 11/20/2015 0809      Component Value Date/Time   CALCIUM 9.3 01/09/2016 1732   CALCIUM 8.5 11/20/2015 0809   ALKPHOS 115 01/09/2016 1732   ALKPHOS 109 11/20/2015 0809   AST 63* 01/09/2016 1732   AST 25 11/20/2015 0809   ALT 31 01/09/2016 1732   ALT 14 11/20/2015 0809   BILITOT 0.9 01/09/2016 1732   BILITOT 0.56 11/20/2015 0809      ASSESSMENT & PLAN:  [name] She tolerated treatment well with good success of getting her hemoglobin and platelet count on the control. I will continue the same dose and see her back in 3 months.   Allergic rhinitis She has evidence of allergic rhinitis and mild expiratory wheeze with coughing. I recommend low-dose prednisone 20 mg 1 week for symptomatic relief.   No orders of the defined types  were placed in this encounter.   All questions were answered. The patient knows to call the clinic with any problems, questions or concerns. No barriers to learning was detected. I spent 15 minutes counseling the patient face to face. The total time spent in the appointment was 20 minutes and more than 50% was on counseling and review of test results     [facility], [place], MD 01/23/2016 8:55 AM

## 2016-01-23 NOTE — Assessment & Plan Note (Signed)
She has evidence of allergic rhinitis and mild expiratory wheeze with coughing. I recommend low-dose prednisone 20 mg 1 week for symptomatic relief.

## 2016-01-23 NOTE — Telephone Encounter (Signed)
per pof to sch pt appt-per pof to sch 7/11 lab/MD-lab in meeting 7/11-pt req to come in @8  on 7/10-lab/7/11MD

## 2016-01-23 NOTE — Assessment & Plan Note (Signed)
She tolerated treatment well with good success of getting her hemoglobin and platelet count on the control. I will continue the same dose and see her back in 3 months.

## 2016-01-26 ENCOUNTER — Ambulatory Visit (INDEPENDENT_AMBULATORY_CARE_PROVIDER_SITE_OTHER): Payer: Medicare Other | Admitting: Family Medicine

## 2016-01-26 ENCOUNTER — Ambulatory Visit (INDEPENDENT_AMBULATORY_CARE_PROVIDER_SITE_OTHER): Payer: Medicare Other

## 2016-01-26 ENCOUNTER — Encounter: Payer: Self-pay | Admitting: Family Medicine

## 2016-01-26 ENCOUNTER — Other Ambulatory Visit: Payer: Self-pay | Admitting: Family Medicine

## 2016-01-26 VITALS — BP 122/78 | HR 92 | Ht 64.5 in | Wt 223.0 lb

## 2016-01-26 DIAGNOSIS — I517 Cardiomegaly: Secondary | ICD-10-CM

## 2016-01-26 DIAGNOSIS — R140 Abdominal distension (gaseous): Secondary | ICD-10-CM

## 2016-01-26 DIAGNOSIS — R319 Hematuria, unspecified: Secondary | ICD-10-CM

## 2016-01-26 DIAGNOSIS — R0602 Shortness of breath: Secondary | ICD-10-CM

## 2016-01-26 DIAGNOSIS — R300 Dysuria: Secondary | ICD-10-CM | POA: Diagnosis not present

## 2016-01-26 DIAGNOSIS — R630 Anorexia: Secondary | ICD-10-CM

## 2016-01-26 DIAGNOSIS — I10 Essential (primary) hypertension: Secondary | ICD-10-CM | POA: Diagnosis not present

## 2016-01-26 DIAGNOSIS — K7460 Unspecified cirrhosis of liver: Secondary | ICD-10-CM

## 2016-01-26 LAB — POCT URINALYSIS DIPSTICK
Bilirubin, UA: NEGATIVE
Glucose, UA: >=1000
Ketones, UA: NEGATIVE
NITRITE UA: NEGATIVE
PH UA: 5.5
PROTEIN UA: NEGATIVE
Spec Grav, UA: 1.01
UROBILINOGEN UA: 0.2

## 2016-01-26 MED ORDER — FUROSEMIDE 20 MG PO TABS: 20.0000 mg | ORAL_TABLET | Freq: Every day | ORAL | Status: DC

## 2016-01-26 MED ORDER — LOSARTAN POTASSIUM 100 MG PO TABS: 100.0000 mg | ORAL_TABLET | Freq: Every day | ORAL | Status: DC

## 2016-01-26 MED ORDER — GLUCOSE BLOOD VI STRP: ORAL_STRIP | Status: DC

## 2016-01-26 MED ORDER — IPRATROPIUM-ALBUTEROL 0.5-2.5 (3) MG/3ML IN SOLN
3.0000 mL | Freq: Once | RESPIRATORY_TRACT | Status: AC
  Administered 2016-01-26: 3 mL via RESPIRATORY_TRACT

## 2016-01-26 MED ORDER — HYDROCHLOROTHIAZIDE 25 MG PO TABS: 25.0000 mg | ORAL_TABLET | Freq: Every day | ORAL | Status: DC

## 2016-01-26 MED ORDER — AMLODIPINE BESYLATE 5 MG PO TABS: 5.0000 mg | ORAL_TABLET | Freq: Every day | ORAL | Status: DC

## 2016-01-26 MED ORDER — ALBUTEROL SULFATE (2.5 MG/3ML) 0.083% IN NEBU
2.5000 mg | INHALATION_SOLUTION | Freq: Once | RESPIRATORY_TRACT | Status: AC
  Administered 2016-01-26: 2.5 mg via RESPIRATORY_TRACT

## 2016-01-26 NOTE — Progress Notes (Signed)
Subjective:    Patient ID: Amanda [name], female    DOB: 1941/10/18, 75 y.o.   MRN: [id number]  HPI Hypertension- Pt denies chest pain, SOB, dizziness, or heart palpitations.  Taking meds as directed w/o problems.  Denies medication side effects.    Abnormal weight gain  - she has lost 5 lbs.  She says it's from not eating the day. She says she is hasn't felt well over the last couple of weeks.  SOB x 2 weeks. Unclear if it may be related to the spring pollen. She says she feels worse when she gets up and moves around and worse when she actually been sober such as trying to reach into the washing machine. She says she feels swollen and bloated. She was diagnosed with asthma 30 years ago but says it has been a problem since then. She has noted some burning with urination in the last few days as well. She's had some urinary frequency as well as some more frequent bowel movements. She says she is just not wanted to eat and has had a decreased appetite. Infectious actually lost 5 pounds. She said she did vomit once but otherwise no persistent nausea or GERD symptoms. No fevers chills or sweats. No upper respiratory symptoms. She was given a prescription for prednisone by her endocrinologist but says that she hasn't really started it because the bottle said to take with breakfast and she really hasn't felt like eating breakfast.   Review of Systems   BP 122/78 mmHg  Pulse 92  Ht 5' 4.5" (1.638 m)  Wt 223 lb (101.152 kg)  BMI 37.70 kg/m2  SpO2 94%  PF 190 L/min    Allergies  Allergen Reactions  . Lisinopril Cough  . Tape Hives  . Doxycycline Hives, Swelling and Rash  . Latex Hives, Itching and Rash    Past Medical History  Diagnosis Date  . Hypertension   . Hyperlipidemia   . Diabetes mellitus 2008  . Depression   . Obesity   . Psoriasis   . Polycythemia     Dr. Elease Hashimoto- [facility]  . Herpes simplex   . Gout   . Hyperglycemia 05/31/2013  . C. difficile colitis   .  Cirrhosis ([place])   . Cancer ([place])     SKIN  . 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 Candidiasis of skin 09/30/2014  . Myocardial infarction ([place])   . Allergic rhinitis 01/23/2016    Past Surgical History  Procedure Laterality Date  . Breast surgery Left     milk duct  . Total abdominal hysterectomy w/ bilateral salpingoophorectomy      for heavy periods with appendectomy  . Tonsillectomy    . Colonoscopy    . Left heart catheterization with coronary angiogram N/A 01/27/2015    Procedure: LEFT HEART CATHETERIZATION WITH CORONARY ANGIOGRAM;  Surgeon: [name], MD;  [facility]: [facility] CATH LAB;  Service: Cardiovascular;  Laterality: N/A;    Social History   Social History  . Marital Status: Married    Spouse Name: N/A  . Number of Children: 2  . Years of Education: 2 yr colle   Occupational History  . housewife    Social History Main Topics  . Smoking status: Former Smoker -- 48 years    Types: Cigarettes    Quit date: 10/18/1986  . Smokeless tobacco: Never Used  . Alcohol Use: No  . Drug Use: No  . Sexual Activity:  Partners: Male    Museum/gallery curator: None   Other Topics Concern  . Not on file   Social History Narrative   2 caffeine drink per day.  No regular exercise.      Family History  Problem Relation Age of Onset  . Diabetes Mother   . Heart disease Mother     CAD  . Hyperlipidemia Mother   . Hypertension Mother   . Kidney disease Mother   . Kidney disease Father   . Breast cancer Maternal Aunt   . Heart disease Maternal Grandmother   . Heart disease Other     maternal aunts and uncles    Outpatient Encounter Prescriptions as of 01/26/2016  Medication Sig  . amLODipine (NORVASC) 5 MG tablet Take 1 tablet (5 mg total) by mouth daily.  Marland Kitchen aspirin EC 81 MG tablet Take 81 mg by mouth daily.  Marland Kitchen dicyclomine (BENTYL) 10 MG capsule Take 1 tab by mouth every morning, may take twice daily as needed.  Marland Kitchen glucose blood (ONETOUCH VERIO) test  strip Use to test blood sugar 2 times daily as instructed. Dx: E11.41  . hydrochlorothiazide (HYDRODIURIL) 25 MG tablet Take 1 tablet (25 mg total) by mouth daily.  . Insulin Pen Needle (CAREFINE PEN NEEDLES) 32G X 5 MM MISC Use to inject insulin 2 times daily as instructed.  . insulin regular human CONCENTRATED (HUMULIN R U-500 KWIKPEN) 500 UNIT/ML kwikpen Inject 100 units under the skin before breakfast and 85units before dinner. (Patient taking differently: Inject 100 units under the skin before breakfast and 80 units before dinner.)  . loperamide (IMODIUM A-D) 2 MG tablet Take 2 mg by mouth daily as needed for diarrhea or loose stools.   Marland Kitchen losartan (COZAAR) 100 MG tablet Take 1 tablet (100 mg total) by mouth daily.  . nitroGLYCERIN (NITROSTAT) 0.4 MG SL tablet Place 1 tablet (0.4 mg total) under the tongue every 5 (five) minutes as needed for chest pain.  . NONFORMULARY OR COMPOUNDED ITEM Neuropathy cream: mix Amitriptyline 2%, Lidocaine 2%, and Ketamine 1% - apply on feet 2x a day [facility].  Marland Kitchen omega-3 acid ethyl esters (LOVAZA) 1 G capsule Take 1 capsule (1 g total) by mouth 2 (two) times daily.  Glory Rosebush DELICA LANCETS FINE MISC Use to test blood sugar 2 times daily as instructed. Dx: E11.41  . rosuvastatin (CRESTOR) 20 MG tablet Take 1 tablet (20 mg total) by mouth daily.  . ruxolitinib phosphate (JAKAFI) 25 MG tablet Take 1 tablet (25 mg total) by mouth 2 (two) times daily.  . ticagrelor (BRILINTA) 90 MG TABS tablet Take 1 tablet (90 mg total) by mouth 2 (two) times daily.  . [DISCONTINUED] glucose blood (ONETOUCH VERIO) test strip Use to test blood sugar 2 times daily as instructed. Dx: E11.41  . [DISCONTINUED] hydrochlorothiazide (MICROZIDE) 12.5 MG capsule Take 1 capsule (12.5 mg total) by mouth daily.  . [DISCONTINUED] hydrocortisone 2.5 % cream Apply 1 application topically 2 (two) times daily.  . [DISCONTINUED] losartan (COZAAR) 100 MG tablet Take 1 tablet (100 mg total)  by mouth daily.  . [DISCONTINUED] nystatin cream (MYCOSTATIN) Apply 1 application topically 2 (two) times daily.  . [DISCONTINUED] ondansetron (ZOFRAN) 8 MG tablet Take 8 mg by mouth every 8 (eight) hours as needed for nausea or vomiting.  . [DISCONTINUED] predniSONE (DELTASONE) 20 MG tablet Take 1 tablet (20 mg total) by mouth daily with breakfast.  . [DISCONTINUED] valACYclovir (VALTREX) 1000 MG tablet Take 1 tablet (1,000 mg  total) by mouth 2 (two) times daily. X 10 days  . [EXPIRED] albuterol (PROVENTIL) (2.5 MG/3ML) 0.083% nebulizer solution 2.5 mg   . [EXPIRED] ipratropium-albuterol (DUONEB) 0.5-2.5 (3) MG/3ML nebulizer solution 3 mL    No facility-administered encounter medications on file as of 01/26/2016.          Objective:   Physical Exam  Constitutional: She is oriented to person, place, and time. She appears well-developed and well-nourished.  HENT:  Head: Normocephalic and atraumatic.  Right Ear: External ear normal.  Left Ear: External ear normal.  Nose: Nose normal.  Mouth/Throat: Oropharynx is clear and moist.  TMs and canals are clear.   Eyes: Conjunctivae and EOM are normal. Pupils are equal, round, and reactive to light.  Neck: Neck supple. No thyromegaly present.  Cardiovascular: Normal rate, regular rhythm and normal heart sounds.   No murmur heard. Pulmonary/Chest: Effort normal. She has no wheezes.  Crackles at the bases bilaterally  Abdominal: Soft. Bowel sounds are normal. She exhibits distension. She exhibits no mass. There is no tenderness. There is no rebound and no guarding.  Increased tympany  Lymphadenopathy:    She has no cervical adenopathy.  Neurological: She is alert and oriented to person, place, and time.  Skin: Skin is warm and dry.  Psychiatric: She has a normal mood and affect.        Assessment & Plan:  HTN - Uncontrolled. Will increase hctz to 36m.  Add amlodipine 545m  F/U in 2 weeks.    Cirrhosis - due for last Twinrix  injection. She is not feeling well today we will hold off the try to get this updated at her next office visit.  SOB- We'll get chest x-ray since she does have crackles at the bases to rule out pneumonia versus pulmonary edema. I don't see any peripheral swelling today but she does have a lot of bloating on abdominal exam. = I did explain that she could take the prednisone with a meal it did not have to be breakfast so she wants to start it today she can.  Dysuria-urinalysis performed.Urinalysis positive for leukocytes. We'll send for culture for confirmation.  DM- would like glucometer strips sent to [facility].     Addendum: Chest x-ray showed possible mild congestive heart failure. Will send over new perception for Lasix to take the next 3 days and follow-up on Wednesday to recheck her lungs as well as a BMP. We'll also get her scheduled for an echocardiogram and will send her to the lab today for CBC, BMP, CMP.

## 2016-01-26 NOTE — Patient Instructions (Signed)
Your blood pressure has been out of control for the last 6 months. Let's increase you hydrochlorothiazide 25 mg. You can take 2 of what you have and I have sent a new prescription to your mail order pharmacy. We are also going to start a second drug called amlodipine which is a calcium channel blocker. I sent a 30 day supply over to [facility] that we can try it for a month before committing to mail order for this drug.

## 2016-01-27 ENCOUNTER — Telehealth: Payer: Self-pay

## 2016-01-27 LAB — CBC WITH DIFFERENTIAL/PLATELET
BASOS ABS: 0 {cells}/uL (ref 0–200)
Basophils Relative: 0 %
EOS ABS: 380 {cells}/uL (ref 15–500)
EOS PCT: 2 %
HEMATOCRIT: 39 % (ref 35.0–45.0)
HEMOGLOBIN: 11.9 g/dL (ref 11.7–15.5)
Lymphocytes Relative: 9 %
Lymphs Abs: 1710 cells/uL (ref 850–3900)
MCH: 28.5 pg (ref 27.0–33.0)
MCHC: 30.5 g/dL — AB (ref 32.0–36.0)
MCV: 93.5 fL (ref 80.0–100.0)
MPV: 9.7 fL (ref 7.5–12.5)
Monocytes Absolute: 950 cells/uL (ref 200–950)
Monocytes Relative: 5 %
NEUTROS ABS: 15960 {cells}/uL — AB (ref 1500–7800)
NEUTROS PCT: 84 %
Platelets: 543 10*3/uL — ABNORMAL HIGH (ref 140–400)
RBC: 4.17 MIL/uL (ref 3.80–5.10)
RDW: 19.5 % — ABNORMAL HIGH (ref 11.0–15.0)
WBC: 19 10*3/uL — ABNORMAL HIGH (ref 3.8–10.8)

## 2016-01-27 LAB — URINE CULTURE
Colony Count: NO GROWTH
ORGANISM ID, BACTERIA: NO GROWTH

## 2016-01-27 LAB — COMPLETE METABOLIC PANEL WITH GFR
ALK PHOS: 106 U/L (ref 33–130)
ALT: 22 U/L (ref 6–29)
AST: 32 U/L (ref 10–35)
Albumin: 4.1 g/dL (ref 3.6–5.1)
BILIRUBIN TOTAL: 0.7 mg/dL (ref 0.2–1.2)
BUN: 16 mg/dL (ref 7–25)
CO2: 23 mmol/L (ref 20–31)
Calcium: 8.8 mg/dL (ref 8.6–10.4)
Chloride: 98 mmol/L (ref 98–110)
Creat: 1.27 mg/dL — ABNORMAL HIGH (ref 0.60–0.93)
GFR, Est African American: 47 mL/min — ABNORMAL LOW (ref >=60)
GFR, Est Non African American: 41 mL/min — ABNORMAL LOW (ref >=60)
GLUCOSE: 477 mg/dL — AB (ref 65–99)
POTASSIUM: 4.5 mmol/L (ref 3.5–5.3)
Sodium: 131 mmol/L — ABNORMAL LOW (ref 135–146)
TOTAL PROTEIN: 6.5 g/dL (ref 6.1–8.1)

## 2016-01-27 LAB — BRAIN NATRIURETIC PEPTIDE: Brain Natriuretic Peptide: 56.3 pg/mL (ref <100)

## 2016-01-27 LAB — TSH: TSH: 3.19 m[IU]/L

## 2016-01-27 NOTE — Telephone Encounter (Signed)
See results note. 

## 2016-01-28 ENCOUNTER — Encounter (HOSPITAL_COMMUNITY): Payer: Self-pay | Admitting: *Deleted

## 2016-01-28 ENCOUNTER — Emergency Department (HOSPITAL_BASED_OUTPATIENT_CLINIC_OR_DEPARTMENT_OTHER)
Admit: 2016-01-28 | Discharge: 2016-01-28 | Disposition: A | Discharge status: Home / Self Care | Payer: Medicare Other | Attending: Emergency Medicine | Admitting: Emergency Medicine

## 2016-01-28 ENCOUNTER — Telehealth: Payer: Self-pay | Admitting: Cardiovascular Disease

## 2016-01-28 ENCOUNTER — Observation Stay (HOSPITAL_COMMUNITY)
Admission: EM | Admit: 2016-01-28 | Discharge: 2016-01-30 | Disposition: A | Discharge status: Home / Self Care | Payer: Medicare Other | Attending: Internal Medicine | Admitting: Internal Medicine

## 2016-01-28 ENCOUNTER — Emergency Department (HOSPITAL_COMMUNITY): Payer: Medicare Other

## 2016-01-28 ENCOUNTER — Ambulatory Visit (INDEPENDENT_AMBULATORY_CARE_PROVIDER_SITE_OTHER): Payer: Medicare Other | Admitting: Family Medicine

## 2016-01-28 ENCOUNTER — Encounter: Payer: Self-pay | Admitting: Family Medicine

## 2016-01-28 VITALS — BP 173/55 | HR 91 | Temp 97.6°F | Ht 64.5 in | Wt 223.0 lb

## 2016-01-28 DIAGNOSIS — E1149 Type 2 diabetes mellitus with other diabetic neurological complication: Secondary | ICD-10-CM | POA: Diagnosis not present

## 2016-01-28 DIAGNOSIS — I130 Hypertensive heart and chronic kidney disease with heart failure and stage 1 through stage 4 chronic kidney disease, or unspecified chronic kidney disease: Secondary | ICD-10-CM | POA: Insufficient documentation

## 2016-01-28 DIAGNOSIS — I5032 Chronic diastolic (congestive) heart failure: Secondary | ICD-10-CM | POA: Insufficient documentation

## 2016-01-28 DIAGNOSIS — R252 Cramp and spasm: Secondary | ICD-10-CM | POA: Diagnosis not present

## 2016-01-28 DIAGNOSIS — D45 Polycythemia vera: Secondary | ICD-10-CM | POA: Diagnosis not present

## 2016-01-28 DIAGNOSIS — M79662 Pain in left lower leg: Secondary | ICD-10-CM

## 2016-01-28 DIAGNOSIS — Z794 Long term (current) use of insulin: Secondary | ICD-10-CM | POA: Insufficient documentation

## 2016-01-28 DIAGNOSIS — R0600 Dyspnea, unspecified: Secondary | ICD-10-CM | POA: Diagnosis not present

## 2016-01-28 DIAGNOSIS — E1165 Type 2 diabetes mellitus with hyperglycemia: Secondary | ICD-10-CM | POA: Insufficient documentation

## 2016-01-28 DIAGNOSIS — Z955 Presence of coronary angioplasty implant and graft: Secondary | ICD-10-CM | POA: Insufficient documentation

## 2016-01-28 DIAGNOSIS — R05 Cough: Secondary | ICD-10-CM | POA: Diagnosis not present

## 2016-01-28 DIAGNOSIS — D72829 Elevated white blood cell count, unspecified: Secondary | ICD-10-CM | POA: Diagnosis not present

## 2016-01-28 DIAGNOSIS — N189 Chronic kidney disease, unspecified: Secondary | ICD-10-CM | POA: Insufficient documentation

## 2016-01-28 DIAGNOSIS — M79609 Pain in unspecified limb: Secondary | ICD-10-CM | POA: Diagnosis not present

## 2016-01-28 DIAGNOSIS — K7460 Unspecified cirrhosis of liver: Secondary | ICD-10-CM | POA: Diagnosis not present

## 2016-01-28 DIAGNOSIS — I10 Essential (primary) hypertension: Secondary | ICD-10-CM | POA: Diagnosis not present

## 2016-01-28 DIAGNOSIS — I252 Old myocardial infarction: Secondary | ICD-10-CM | POA: Diagnosis not present

## 2016-01-28 DIAGNOSIS — E1122 Type 2 diabetes mellitus with diabetic chronic kidney disease: Secondary | ICD-10-CM | POA: Diagnosis not present

## 2016-01-28 DIAGNOSIS — I2510 Atherosclerotic heart disease of native coronary artery without angina pectoris: Secondary | ICD-10-CM | POA: Diagnosis not present

## 2016-01-28 DIAGNOSIS — R0602 Shortness of breath: Secondary | ICD-10-CM

## 2016-01-28 DIAGNOSIS — E1140 Type 2 diabetes mellitus with diabetic neuropathy, unspecified: Secondary | ICD-10-CM

## 2016-01-28 LAB — CBC
HCT: 38.4 % (ref 36.0–46.0)
HEMOGLOBIN: 12.4 g/dL (ref 12.0–15.0)
MCH: 28.6 pg (ref 26.0–34.0)
MCHC: 32.3 g/dL (ref 30.0–36.0)
MCV: 88.5 fL (ref 78.0–100.0)
Platelets: 523 10*3/uL — ABNORMAL HIGH (ref 150–400)
RBC: 4.34 MIL/uL (ref 3.87–5.11)
RDW: 18.7 % — AB (ref 11.5–15.5)
WBC: 19.5 10*3/uL — AB (ref 4.0–10.5)

## 2016-01-28 LAB — CBG MONITORING, ED
GLUCOSE-CAPILLARY: 423 mg/dL — AB (ref 65–99)
Glucose-Capillary: 297 mg/dL — ABNORMAL HIGH (ref 65–99)

## 2016-01-28 LAB — BASIC METABOLIC PANEL
ANION GAP: 14 (ref 5–15)
BUN: 14 mg/dL (ref 6–20)
CALCIUM: 8.7 mg/dL — AB (ref 8.9–10.3)
CHLORIDE: 101 mmol/L (ref 101–111)
CO2: 19 mmol/L — AB (ref 22–32)
Creatinine, Ser: 1.18 mg/dL — ABNORMAL HIGH (ref 0.44–1.00)
GFR calc non Af Amer: 44 mL/min — ABNORMAL LOW (ref >60)
GFR, EST AFRICAN AMERICAN: 51 mL/min — AB (ref >60)
Glucose, Bld: 521 mg/dL — ABNORMAL HIGH (ref 65–99)
Potassium: 4.1 mmol/L (ref 3.5–5.1)
SODIUM: 134 mmol/L — AB (ref 135–145)

## 2016-01-28 LAB — I-STAT VENOUS BLOOD GAS, ED
Acid-base deficit: 3 mmol/L — ABNORMAL HIGH (ref 0.0–2.0)
Bicarbonate: 21.3 mEq/L (ref 20.0–24.0)
O2 SAT: 92 %
PCO2 VEN: 33.4 mmHg — AB (ref 45.0–50.0)
PO2 VEN: 61 mmHg — AB (ref 31.0–45.0)
TCO2: 22 mmol/L (ref 0–100)
pH, Ven: 7.412 — ABNORMAL HIGH (ref 7.250–7.300)

## 2016-01-28 LAB — BRAIN NATRIURETIC PEPTIDE: B Natriuretic Peptide: 77.9 pg/mL (ref 0.0–100.0)

## 2016-01-28 LAB — I-STAT TROPONIN, ED: Troponin i, poc: 0.02 ng/mL (ref 0.00–0.08)

## 2016-01-28 MED ORDER — ALBUTEROL SULFATE (2.5 MG/3ML) 0.083% IN NEBU
2.5000 mg | INHALATION_SOLUTION | Freq: Once | RESPIRATORY_TRACT | Status: AC
  Administered 2016-01-28: 2.5 mg via RESPIRATORY_TRACT

## 2016-01-28 MED ORDER — INSULIN ASPART 100 UNIT/ML ~~LOC~~ SOLN
12.0000 [IU] | Freq: Once | SUBCUTANEOUS | Status: AC
  Administered 2016-01-28: 12 [IU] via SUBCUTANEOUS
  Filled 2016-01-28: qty 1

## 2016-01-28 MED ORDER — IOPAMIDOL (ISOVUE-370) INJECTION 76%
INTRAVENOUS | Status: AC
  Administered 2016-01-28: 100 mL
  Filled 2016-01-28: qty 100

## 2016-01-28 NOTE — ED Notes (Signed)
Pt's family member has been pacing RN station for more than 10 minutes looking for info on pt status; Family told that MD will be in to discuss test and to determine if pt will be admitted; Family asked to remain in room to wait for  MD; Family member tried roaming pod to find Md; Family asked to just wait for Md to return with status

## 2016-01-28 NOTE — ED Notes (Signed)
Placed pt on 2L O2 via [place] d/t continued SOB, tachypnea.

## 2016-01-28 NOTE — ED Notes (Signed)
Pt CBG is 297. Notified MD.

## 2016-01-28 NOTE — ED Provider Notes (Signed)
CSN: [id number]     Arrival date & time 01/28/16  1334 History   First MD Initiated Contact with Patient 01/28/16 1617     Chief Complaint  Patient presents with  . Shortness of Breath     (Consider location/radiation/quality/duration/timing/severity/associated sxs/prior Treatment) HPI Point of nonproductive cough and shortness of breath onset 4 days ago and also complains of left calf pain . She was seen by her primary care physician Dr. Charise Carwin, treated with Lasix which she states partial relief she denies any fever denies other associated symptoms. She sleeps on 2 pillows at baseline. Dr. Charise Carwin was concerned about pulmonary embolism and also congestive heart failure although congestive heart failure she feels is less likely. Patient denies noncompliance with medications. Past Medical History  Diagnosis Date  . Hypertension   . Hyperlipidemia   . Diabetes mellitus 2008  . Depression   . Obesity   . Psoriasis   . Polycythemia     Dr. Elease Hashimoto- [facility]  . Herpes simplex   . Gout   . Hyperglycemia 05/31/2013  . C. difficile colitis   . Cirrhosis ([place])   . Cancer ([place])     SKIN  . Neuromuscular disorder ([place])     BELL PALSY  . Candidiasis of skin 09/30/2014  . Myocardial infarction ([place])   . Allergic rhinitis 01/23/2016   Past Surgical History  Procedure Laterality Date  . Breast surgery Left     milk duct  . Total abdominal hysterectomy w/ bilateral salpingoophorectomy      for heavy periods with appendectomy  . Tonsillectomy    . Colonoscopy    . Left heart catheterization with coronary angiogram N/A 01/27/2015    Procedure: LEFT HEART CATHETERIZATION WITH CORONARY ANGIOGRAM;  Surgeon: [name], MD;  Location: [facility] CATH LAB;  Service: Cardiovascular;  Laterality: N/A;   Family History  Problem Relation Age of Onset  . Diabetes Mother   . Heart disease Mother     CAD  . Hyperlipidemia Mother   . Hypertension Mother   . Kidney disease Mother   . Kidney  disease Father   . Breast cancer Maternal Aunt   . Heart disease Maternal Grandmother   . Heart disease Other     maternal aunts and uncles   Social History  Substance Use Topics  . Smoking status: Former Smoker -- 48 years    Types: Cigarettes    Quit date: 10/18/1986  . Smokeless tobacco: Never Used  . Alcohol Use: No   OB History    No data available     Review of Systems  Constitutional: Negative.   HENT: Negative.   Respiratory: Positive for cough and shortness of breath.   Cardiovascular: Negative.   Gastrointestinal: Negative.   Musculoskeletal: Positive for myalgias.       Left calf pain  Skin: Negative.   Allergic/Immunologic: Positive for immunocompromised state.       Diabetic  Neurological: Negative.   Psychiatric/Behavioral: Negative.   All other systems reviewed and are negative.     Allergies  Lisinopril; Tape; Doxycycline; and Latex  Home Medications   Prior to Admission medications   Medication Sig Start Date End Date Taking? Authorizing Provider  amLODipine (NORVASC) 5 MG tablet Take 1 tablet (5 mg total) by mouth daily. 01/26/16   Hali Marry, MD  aspirin EC 81 MG tablet Take 81 mg by mouth daily.    Historical Provider, MD  dicyclomine (BENTYL) 10 MG capsule Take 1 tab by  mouth every morning, may take twice daily as needed. 05/15/15   Amy S Esterwood, PA-C  furosemide (LASIX) 20 MG tablet Take 1 tablet (20 mg total) by mouth daily. 01/26/16   Hali Marry, MD  glucose blood (ONETOUCH VERIO) test strip Use to test blood sugar 2 times daily as instructed. Dx: N82.95 01/26/16   Hali Marry, MD  hydrochlorothiazide (HYDRODIURIL) 25 MG tablet Take 1 tablet (25 mg total) by mouth daily. Patient not taking: Reported on 01/28/2016 01/26/16   Hali Marry, MD  Insulin Pen Needle (CAREFINE PEN NEEDLES) 32G X 5 MM MISC Use to inject insulin 2 times daily as instructed. 11/27/15   [name], MD  insulin regular human  CONCENTRATED (HUMULIN R U-500 KWIKPEN) 500 UNIT/ML kwikpen Inject 100 units under the skin before breakfast and 85units before dinner. Patient taking differently: Inject 100 units under the skin before breakfast and 80 units before dinner. 11/27/15   [name], MD  loperamide (IMODIUM A-D) 2 MG tablet Take 2 mg by mouth daily as needed for diarrhea or loose stools.     Historical Provider, MD  losartan (COZAAR) 100 MG tablet Take 1 tablet (100 mg total) by mouth daily. 01/26/16   Hali Marry, MD  nitroGLYCERIN (NITROSTAT) 0.4 MG SL tablet Place 1 tablet (0.4 mg total) under the tongue every 5 (five) minutes as needed for chest pain. 02/06/15   [name], MD  NONFORMULARY OR COMPOUNDED ITEM Neuropathy cream: mix Amitriptyline 2%, Lidocaine 2%, and Ketamine 1% - apply on feet 2x a day [facility]. 01/23/15   [name], MD  omega-3 acid ethyl esters (LOVAZA) 1 G capsule Take 1 capsule (1 g total) by mouth 2 (two) times daily. 02/07/15   [name], MD  [facility] DELICA LANCETS FINE MISC Use to test blood sugar 2 times daily as instructed. Dx: E11.41 07/07/15   [name], MD  rosuvastatin (CRESTOR) 20 MG tablet Take 1 tablet (20 mg total) by mouth daily. 05/12/15   [name], MD  ruxolitinib phosphate (JAKAFI) 25 MG tablet Take 1 tablet (25 mg total) by mouth 2 (two) times daily. 10/16/15   [name], MD  ticagrelor (BRILINTA) 90 MG TABS tablet Take 1 tablet (90 mg total) by mouth 2 (two) times daily. 02/06/15   [name], MD   BP 173/64 mmHg  Pulse 97  Temp(Src) 98.6 F (37 C) (Oral)  Resp 20  Ht 5' 4.5" (1.638 m)  Wt 223 lb (101.152 kg)  BMI 37.70 kg/m2  SpO2 97% Physical Exam  Constitutional: She is oriented to person, place, and time. She appears well-developed and well-nourished.  Moderately ill-appearing, mildly dyspneic speaks in sentences  HENT:  Head: Normocephalic and atraumatic.  Eyes: Conjunctivae are normal. Pupils are equal, round,  and reactive to light.  Neck: Neck supple. No JVD present. No tracheal deviation present. No thyromegaly present.  Cardiovascular: Normal rate and regular rhythm.   No murmur heard. Pulmonary/Chest: Effort normal and breath sounds normal.  Abdominal: Soft. Bowel sounds are normal. She exhibits no distension. There is no tenderness.  Musculoskeletal: Normal range of motion. She exhibits tenderness. She exhibits no edema.  Left lower extremity no swelling Tender left calf. DP pulses 2+ bilaterally. All other sugars at redness or tenderness neurovascularly intact  Neurological: She is alert and oriented to person, place, and time. Coordination normal.  Skin: Skin is warm and dry. No rash noted.  Psychiatric: She has a normal mood  and affect.  Nursing note and vitals reviewed.   ED Course  Procedures (including critical care time) Labs Review Labs Reviewed  BASIC METABOLIC PANEL - Abnormal; Notable for the following:    Sodium 134 (*)    CO2 19 (*)    Glucose, Bld 521 (*)    Creatinine, Ser 1.18 (*)    Calcium 8.7 (*)    GFR calc non Af Amer 44 (*)    GFR calc Af Amer 51 (*)    All other components within normal limits  CBC - Abnormal; Notable for the following:    WBC 19.5 (*)    RDW 18.7 (*)    Platelets 523 (*)    All other components within normal limits  BRAIN NATRIURETIC PEPTIDE  BLOOD GAS, VENOUS  I-STAT TROPOININ, ED    Imaging Review Dg Chest 2 View  01/28/2016  CLINICAL DATA:  Shortness of breath and asthma the past 3 days. Cough. Former smoker. History of hypertension and diabetes. EXAM: CHEST  2 VIEW COMPARISON:  01/26/2016; 01/31/2015; 01/29/2014; 05/31/2013 FINDINGS: Grossly unchanged cardiac silhouette and mediastinal contours with atherosclerotic plaque within the thoracic aorta. Lung volumes remain reduced with grossly unchanged bibasilar heterogeneous opacities favored to represent atelectasis or scar. There is unchanged mild diffuse slightly nodular thickening of  the pulmonary interstitium. No discrete focal airspace opacities. No pleural effusion or pneumothorax. There is persistent minimal pleural parenchymal thickening about the bilateral major and the right minor fissures. No evidence of edema. No acute osseous abnormalities. IMPRESSION: Similar findings of chronic bronchitic change and hypoventilation without definitive superimposed acute cardiopulmonary disease. Electronically Signed   By: Sandi Mariscal M.D.   On: 01/28/2016 14:26   I have personally reviewed and evaluated these images and lab results as part of my medical decision-making.   EKG Interpretation   Date/Time:  Wednesday January 28 2016 13:42:32 EDT Ventricular Rate:  96 PR Interval:  138 QRS Duration: 90 QT Interval:  378 QTC Calculation: 477 R Axis:   -39 Text Interpretation:  Sinus rhythm with occasional Premature ventricular  complexes Left axis deviation Abnormal ECG No significant change since  last tracing Confirmed by Winfred Leeds  MD, Ercilia Bettinger [phone]) on 01/28/2016 4:02:03  PM     Chest x-ray viewed by me. 9:40 PM patient appears mild to moderately dyspneic patient. She remains tachycardic and tachypnea. Results for orders placed or performed during the hospital encounter of 20/94/70  Basic metabolic panel  Result Value Ref Range   Sodium 134 (L) 135 - 145 mmol/L   Potassium 4.1 3.5 - 5.1 mmol/L   Chloride 101 101 - 111 mmol/L   CO2 19 (L) 22 - 32 mmol/L   Glucose, Bld 521 (H) 65 - 99 mg/dL   BUN 14 6 - 20 mg/dL   Creatinine, Ser 1.18 (H) 0.44 - 1.00 mg/dL   Calcium 8.7 (L) 8.9 - 10.3 mg/dL   GFR calc non Af Amer 44 (L) >60 mL/min   GFR calc Af Amer 51 (L) >60 mL/min   Anion gap 14 5 - 15  CBC  Result Value Ref Range   WBC 19.5 (H) 4.0 - 10.5 K/uL   RBC 4.34 3.87 - 5.11 MIL/uL   Hemoglobin 12.4 12.0 - 15.0 g/dL   HCT 38.4 36.0 - 46.0 %   MCV 88.5 78.0 - 100.0 fL   MCH 28.6 26.0 - 34.0 pg   MCHC 32.3 30.0 - 36.0 g/dL   RDW 18.7 (H) 11.5 - 15.5 %   Platelets  523 (H)  150 - 400 K/uL  Brain natriuretic peptide  Result Value Ref Range   B Natriuretic Peptide 77.9 0.0 - 100.0 pg/mL  I-stat troponin, ED  Result Value Ref Range   Troponin i, poc 0.02 0.00 - 0.08 ng/mL   Comment 3          I-Stat venous blood gas, ED  Result Value Ref Range   pH, Ven 7.412 (H) 7.250 - 7.300   pCO2, Ven 33.4 (L) 45.0 - 50.0 mmHg   pO2, Ven 61.0 (H) 31.0 - 45.0 mmHg   Bicarbonate 21.3 20.0 - 24.0 mEq/L   TCO2 22 0 - 100 mmol/L   O2 Saturation 92.0 %   Acid-base deficit 3.0 (H) 0.0 - 2.0 mmol/L   Patient temperature HIDE    Sample type VENOUS   CBG monitoring, ED  Result Value Ref Range   Glucose-Capillary 423 (H) 65 - 99 mg/dL   Comment 1 Document in Chart    Dg Chest 2 View  01/28/2016  CLINICAL DATA:  Shortness of breath and asthma the past 3 days. Cough. Former smoker. History of hypertension and diabetes. EXAM: CHEST  2 VIEW COMPARISON:  01/26/2016; 01/31/2015; 01/29/2014; 05/31/2013 FINDINGS: Grossly unchanged cardiac silhouette and mediastinal contours with atherosclerotic plaque within the thoracic aorta. Lung volumes remain reduced with grossly unchanged bibasilar heterogeneous opacities favored to represent atelectasis or scar. There is unchanged mild diffuse slightly nodular thickening of the pulmonary interstitium. No discrete focal airspace opacities. No pleural effusion or pneumothorax. There is persistent minimal pleural parenchymal thickening about the bilateral major and the right minor fissures. No evidence of edema. No acute osseous abnormalities. IMPRESSION: Similar findings of chronic bronchitic change and hypoventilation without definitive superimposed acute cardiopulmonary disease. Electronically Signed   By: Sandi Mariscal M.D.   On: 01/28/2016 14:26   Dg Chest 2 View  01/26/2016  CLINICAL DATA:  Shortness of breath. EXAM: CHEST  2 VIEW COMPARISON:  01/31/2015. FINDINGS: Cardiomegaly with mild bilateral from interstitial prominence. Mild congestive heart  failure cannot be excluded. No pleural effusion or pneumothorax . No acute bony abnormality . IMPRESSION: Cardiomegaly with mild bilateral pulmonary interstitial prominence suggesting mild congestive heart failure. Electronically Signed   By: Marcello Moores  Register   On: 01/26/2016 10:26   Ct Angio Chest Pe W/cm &/or Wo Cm  01/28/2016  CLINICAL DATA:  Shortness of breath with 1 week history of left calf region pain EXAM: CT ANGIOGRAPHY CHEST WITH CONTRAST TECHNIQUE: Multidetector CT imaging of the chest was performed using the standard protocol during bolus administration of intravenous contrast. Multiplanar CT image reconstructions and MIPs were obtained to evaluate the vascular anatomy. CONTRAST:  65 mL Isovue 370 nonionic COMPARISON:  Chest radiograph January 28, 2016 FINDINGS: Mediastinum/Lymph Nodes: There is no demonstrable pulmonary embolus. There is prominence of the ascending thoracic aorta with a maximum transverse diameter of 4.1 x 3.4 cm. No dissection evident. There are scattered foci of atherosclerotic calcification in aorta. The visualized great vessels appear unremarkable. There is left ventricular hypertrophy. There are foci of coronary artery calcification bilaterally. There is also mild pericardial calcification along the posterior lateral aspects of the pericardium. There is a minimal pericardial effusion. Thyroid appears unremarkable. There are scattered subcentimeter mediastinal lymph nodes but no adenopathy by size criteria. Lungs/Pleura: There is mild bibasilar atelectasis. There is no parenchymal lung edema or consolidation. On axial slice 44 series 5, there is a nodular opacity measuring 7 x 6 mm in the superior segment of the right  lower lobe. Upper abdomen: In the visualized upper abdomen, the liver has a nodular contour with prominence of the caudate lobe, findings indicative of hepatic cirrhosis. Spleen is incompletely visualized but is noted to be enlarged. There is a calculus in the mid  right kidney measuring 8 x 3 mm. There is atherosclerotic calcification in the aorta. Visualized upper abdominal structures otherwise appear unremarkable. Musculoskeletal: There are no blastic or lytic bone lesions. There is degenerative change in thoracic spine. Review of the MIP images confirms the above findings. IMPRESSION: No demonstrable pulmonary embolus. Prominence of the ascending thoracic aorta with a maximum transverse diameter of 4.1 x 3.4 cm. Recommend annual imaging followup by CTA or MRA. This recommendation follows 2010 ACCF/AHA/AATS/ACR/ASA/SCA/SCAI/SIR/STS/SVM Guidelines for the Diagnosis and Management of Patients with Thoracic Aortic Disease. Circulation. 2010; 121: F810-F751 No parenchymal lung edema or consolidation. 7 x 6 mm nodular opacity is noted in superior segment right lower lobe. Non-contrast chest CT at 6-12 months is recommended. If the nodule is stable at time of repeat CT, then future CT at 18-24 months (from today's scan) is considered optional for low-risk patients, but is recommended for high-risk patients. This recommendation follows the consensus statement: Guidelines for Management of Incidental Pulmonary Nodules Detected on CT Images:From the Fleischner Society 2017; published online before print (10.1148/radiol.[id number]). No demonstrable adenopathy. Evidence of hepatic cirrhosis with splenomegaly. Scattered foci of atherosclerotic calcification in the aorta. There are scattered foci of coronary artery calcification. There is a minimal pericardial effusion with areas of pericardial calcification posteriorly and laterally. Electronically Signed   By: Lowella Grip III M.D.   On: 01/28/2016 20:28    MDM  I suspect the patient may need further evaluation to check for cardiomyopathy  I've consulted Dr. Loleta Books plan 23 hour observation, telemetry  Final diagnoses:  None  Dx #1 dyspnea #2 hyperglycemia #3 leukocytosis      Orlie Dakin, MD 01/28/16 2157

## 2016-01-28 NOTE — Telephone Encounter (Signed)
Pt was told by PCP today that she has CHF -wheezing and trouble walking long distances-husband would like a call @ [phone]

## 2016-01-28 NOTE — Telephone Encounter (Signed)
Follow up   Pt husband is calling from [facility] wife is being triaged  He is wanting a call back

## 2016-01-28 NOTE — ED Notes (Signed)
Pt reports recent nonproductive cough, sob and leg swelling. Was seen at pcp and sent here for further eval. spo2 96% at triage.

## 2016-01-28 NOTE — ED Notes (Signed)
Vascular Tech called @ [phone].

## 2016-01-28 NOTE — ED Notes (Signed)
1st attempt to call report

## 2016-01-28 NOTE — Telephone Encounter (Signed)
Returned call to [name]. He is not the DPR but he said that she was in the [facility] ED and he was told to call back and tell Wannetta Sender they were there. Advised him to let the nurse know in the ED to call Trish to let them know they were there. Phone call was a confusing in nature because the phone call kept sounding in and out and was unable to here the complete conversation. Pt was in the care of the ED.

## 2016-01-28 NOTE — Progress Notes (Signed)
Subjective:    Patient ID: Amanda [name], female    DOB: [birth year], 75 y.o.   MRN: [id number]  HPI Patient came in apparently 2 days ago to follow-up on her blood pressure. She was also concerned that she gained about 5 pounds and wasn't sure why. She also mentioned that she been short of breath for couple weeks. She wasn't sure if it was related to spring allergies. She said she had been diagnosed with asthma 30 years ago. On pulmonary exam she had  crackles at the bases bilaterally,. Then ordered  chest x-ray to rule out infection or fluid. Chest x-ray showed some mild bilateral pulmonary interstitial prominence suggestive of mild congestive heart failure. We decided to go ahead and put her on Lasix for 2 days and have her follow-up in 48 hours. She said she did respond to the diuretic and has noticed increased urination. But she continues to feel short of breath. She feels more short of breath today than she did when I saw her 2 days ago. She denies any chest pain or discomfort but has had some left calf pain. She's also noticed some cramping in the last couple of days. No fevers chills or sweats. No cough or upper respiratory symptoms. He is also had a decreased appetite but no nausea or abdominal pain. Her glucose was 477 when I saw her 2 days ago. She says she has been taking her insulin regularly.     Review of Systems  BP 173/55 mmHg  Pulse 91  Temp(Src) 97.6 F (36.4 C)  Ht 5' 4.5" (1.638 m)  Wt 223 lb (101.152 kg)  BMI 37.70 kg/m2  SpO2 95%    Allergies  Allergen Reactions  . Lisinopril Cough  . Tape Hives  . Doxycycline Hives, Swelling and Rash  . Latex Hives, Itching and Rash    Past Medical History  Diagnosis Date  . Hypertension   . Hyperlipidemia   . Diabetes mellitus 2008  . Depression   . Obesity   . Psoriasis   . Polycythemia     Dr. Elease Hashimoto- [facility]  . Herpes simplex   . Gout   . Hyperglycemia 05/31/2013  . C. difficile colitis   . Cirrhosis ([place])    . Cancer ([place])     SKIN  . Neuromuscular disorder ([place])     BELL PALSY  . Candidiasis of skin 09/30/2014  . Myocardial infarction ([place])   . Allergic rhinitis 01/23/2016    Past Surgical History  Procedure Laterality Date  . Breast surgery Left     milk duct  . Total abdominal hysterectomy w/ bilateral salpingoophorectomy      for heavy periods with appendectomy  . Tonsillectomy    . Colonoscopy    . Left heart catheterization with coronary angiogram N/A 01/27/2015    Procedure: LEFT HEART CATHETERIZATION WITH CORONARY ANGIOGRAM;  Surgeon: [name], MD;  Location: [facility] CATH LAB;  Service: Cardiovascular;  Laterality: N/A;    Social History   Social History  . Marital Status: Married    Spouse Name: N/A  . Number of Children: 2  . Years of Education: 2 yr colle   Occupational History  . housewife    Social History Main Topics  . Smoking status: Former Smoker -- 48 years    Types: Cigarettes    Quit date: 10/18/1986  . Smokeless tobacco: Never Used  . Alcohol Use: No  . Drug Use: No  . Sexual Activity:  Partners: Male    Museum/gallery curator: None   Other Topics Concern  . Not on file   Social History Narrative   2 caffeine drink per day.  No regular exercise.      Family History  Problem Relation Age of Onset  . Diabetes Mother   . Heart disease Mother     CAD  . Hyperlipidemia Mother   . Hypertension Mother   . Kidney disease Mother   . Kidney disease Father   . Breast cancer Maternal Aunt   . Heart disease Maternal Grandmother   . Heart disease Other     maternal aunts and uncles    Outpatient Encounter Prescriptions as of 01/28/2016  Medication Sig  . amLODipine (NORVASC) 5 MG tablet Take 1 tablet (5 mg total) by mouth daily.  Marland Kitchen aspirin EC 81 MG tablet Take 81 mg by mouth daily.  Marland Kitchen dicyclomine (BENTYL) 10 MG capsule Take 1 tab by mouth every morning, may take twice daily as needed.  . furosemide (LASIX) 20 MG tablet Take 1 tablet (20 mg  total) by mouth daily.  Marland Kitchen glucose blood (ONETOUCH VERIO) test strip Use to test blood sugar 2 times daily as instructed. Dx: E11.41  . Insulin Pen Needle (CAREFINE PEN NEEDLES) 32G X 5 MM MISC Use to inject insulin 2 times daily as instructed.  . insulin regular human CONCENTRATED (HUMULIN R U-500 KWIKPEN) 500 UNIT/ML kwikpen Inject 100 units under the skin before breakfast and 85units before dinner. (Patient taking differently: Inject 100 units under the skin before breakfast and 80 units before dinner.)  . loperamide (IMODIUM A-D) 2 MG tablet Take 2 mg by mouth daily as needed for diarrhea or loose stools.   Marland Kitchen losartan (COZAAR) 100 MG tablet Take 1 tablet (100 mg total) by mouth daily.  . nitroGLYCERIN (NITROSTAT) 0.4 MG SL tablet Place 1 tablet (0.4 mg total) under the tongue every 5 (five) minutes as needed for chest pain.  . NONFORMULARY OR COMPOUNDED ITEM Neuropathy cream: mix Amitriptyline 2%, Lidocaine 2%, and Ketamine 1% - apply on feet 2x a day [facility].  Marland Kitchen omega-3 acid ethyl esters (LOVAZA) 1 G capsule Take 1 capsule (1 g total) by mouth 2 (two) times daily.  Gl[facility] DELICA LANCETS FINE MISC Use to test blood sugar 2 times daily as instructed. Dx: E11.41  . rosuvastatin (CRESTOR) 20 MG tablet Take 1 tablet (20 mg total) by mouth daily.  . ruxolitinib phosphate (JAKAFI) 25 MG tablet Take 1 tablet (25 mg total) by mouth 2 (two) times daily.  . ticagrelor (BRILINTA) 90 MG TABS tablet Take 1 tablet (90 mg total) by mouth 2 (two) times daily.  . hydrochlorothiazide (HYDRODIURIL) 25 MG tablet Take 1 tablet (25 mg total) by mouth daily. (Patient not taking: Reported on 01/28/2016)   Facility-Administered Encounter Medications as of 01/28/2016  Medication  . albuterol (PROVENTIL) (2.5 MG/3ML) 0.083% nebulizer solution 2.5 mg         Objective:   Physical Exam  Constitutional: She is oriented to person, place, and time. She appears well-developed and well-nourished.  HENT:   Head: Normocephalic and atraumatic.  Right Ear: External ear normal.  Left Ear: External ear normal.  Nose: Nose normal.  Mouth/Throat: Oropharynx is clear and moist.  TMs and canals are clear.   Eyes: Conjunctivae and EOM are normal. Pupils are equal, round, and reactive to light.  Neck: Neck supple. No thyromegaly present.  Cardiovascular: Normal rate, regular rhythm and normal heart  sounds.   Pulmonary/Chest: Effort normal and breath sounds normal. She has no wheezes.  Coarse inspiratory BS.  Crackles most prominent at right base.   Lymphadenopathy:    She has no cervical adenopathy.  Neurological: She is alert and oriented to person, place, and time.  Skin: Skin is warm and dry.  Psychiatric: She has a normal mood and affect.        Assessment & Plan:  Increase shortness of breath-at this point I recommend that she go to the emergency department. Her husband is going to drop her intake or ecchymosis:. I more about the possibility of a pulmonary embolism. She is on Brilinta currently status post stent placement. I think congestive heart failure is less likely as her BNP was normal and she has not had any improvement of her symptoms with diuresis.  Leg cramping - could be from hypokalemia from her lasix. Started 2 days ago. .  Left calf pain-extremely tender on exam. Worrisome for DVT. 95% pulse ox.    DM- Uncontrolled.   REFERRED to ED immediately. Her husband will drive her to [facility]- ED.

## 2016-01-28 NOTE — H&P (Signed)
History and Physical  Patient Name: Amanda [name]     OIB:[id number]    DOB: May 16, 1941    DOA: 01/28/2016 Referring physician: Orlie Dakin, MD PCP: Beatrice Lecher, MD  Medical team: [name], MD as Consulting Physician (Hematology and Oncology) [name], MD as Consulting Physician (Endocrinology) [name], MD as Consulting Physician (Cardiology) Milus Banister, MD as Attending Physician (Gastroenterology)     Chief Complaint: Dyspnea  HPI: Amanda [name] is a 75 y.o. 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  The patient was in her usual state of health until about 2 weeks ago when she started to develop worsening shortness of breath (although she also thinks this may have dated back to 6 months ago when she started to "gain a lot of weight").  She saw her oncologist for routine follow-up of her polycythemia vera, who noted wheezing on exam and recommended prednisone and rest. She took prednisone 20 mg daily for a few days, and got some rest, and felt better.  However, in the last week, with the Passover holiday, she has been under a lot of stress with family, and very busy with cooking, and feels like she is much more short of breath than usual. This feels like "an elephant on my chest" worse when she is worrying about things, and with maybe some wheezing. There is also a lot of anxiety, and a feeling of "like my body is trying to climb out of my skin". She had no leg swelling, no worsening orthopnea, no nocturnal symptoms. She notes increased abdominal girth.  No fever, chills, sputum.   Monday, she saw her PCP and was started on furosemide, which she took for two days.  Last night, she had a bad left calf cramp at dinner and today reported this to her PCP who was therefore concerned about PE and sent her to the ER.    In the ED, she was afebrile, tachycardic, normotensive, with low-normal oxygen on room air.  Na 134, glucose 521, K 4.1, Cr 1.18 (from baseline 1.0), WBC 19.5K (slightly above baseline leukocytosis), Hgb 12.4, platelets 520K.  BNP 77, troponin negative.  CXR showed small lung volumes but no focal opacity.  CT angiogram of the chest showed no lung parenchyma disease, no edema, no PE.  Did also show small thoracic aneurysm and small subcentimeter nodule, outpatient follow up recommended.  The patient was given insulin for hyperglycemia and TRH were asked to evaluate for admission given respiratory distress and unclear etiology.     Review of Systems:  All other systems negative except as just noted or noted in the history of present illness.  Allergies  Allergen Reactions  . Lisinopril Cough  . Tape Hives  . Doxycycline Hives, Swelling and Rash  . Latex Hives, Itching and Rash    Prior to Admission medications   Medication Sig Start Date End Date Taking? Authorizing Provider  dicyclomine (BENTYL) 10 MG capsule Take 1 tab by mouth every morning, may take twice daily as needed. 05/15/15  Yes Amy S Esterwood, PA-C  furosemide (LASIX) 20 MG tablet Take 1 tablet (20 mg total) by mouth daily. 01/26/16  Yes Hali Marry, MD  insulin regular human CONCENTRATED (HUMULIN R U-500 KWIKPEN) 500 UNIT/ML kwikpen Inject 100 units under the skin before breakfast and 85units before dinner. Patient taking differently: Inject  100 units under the skin before breakfast and 80 units before dinner. 11/27/15  Yes [name], MD  loperamide (IMODIUM A-D) 2 MG tablet Take 2 mg by mouth daily as needed for diarrhea or loose stools.    Yes Historical Provider, MD  losartan (COZAAR) 100 MG tablet Take 1 tablet (100 mg total) by mouth daily. 01/26/16  Yes Hali Marry, MD  nitroGLYCERIN (NITROSTAT) 0.4 MG SL tablet Place 1 tablet (0.4 mg total) under the tongue every 5 (five) minutes as needed for  chest pain. 02/06/15  Yes [name], MD  NONFORMULARY OR COMPOUNDED ITEM Neuropathy cream: mix Amitriptyline 2%, Lidocaine 2%, and Ketamine 1% - apply on feet 2x a day [facility]. 01/23/15  Yes [name], MD  omega-3 acid ethyl esters (LOVAZA) 1 G capsule Take 1 capsule (1 g total) by mouth 2 (two) times daily. 02/07/15  Yes [name], MD  rosuvastatin (CRESTOR) 20 MG tablet Take 1 tablet (20 mg total) by mouth daily. 05/12/15  Yes [name], MD  ruxolitinib phosphate (JAKAFI) 25 MG tablet Take 1 tablet (25 mg total) by mouth 2 (two) times daily. 10/16/15  Yes [name], MD  ticagrelor (BRILINTA) 90 MG TABS tablet Take 1 tablet (90 mg total) by mouth 2 (two) times daily. 02/06/15  Yes [name], MD  amLODipine (NORVASC) 5 MG tablet Take 1 tablet (5 mg total) by mouth daily. 01/26/16   Hali Marry, MD    Past Medical History  Diagnosis Date  . Hypertension   . Hyperlipidemia   . Diabetes mellitus 2008  . Depression   . Obesity   . Psoriasis   . Polycythemia     Dr. Elease Hashimoto- [facility]  . Herpes simplex   . Gout   . Hyperglycemia 05/31/2013  . C. difficile colitis   . Cirrhosis ([place])   . Cancer ([place])     SKIN  . Neuromuscular disorder ([place])     BELL PALSY  . Candidiasis of skin 09/30/2014  . Myocardial infarction ([place])   . Allergic rhinitis 01/23/2016    Past Surgical History  Procedure Laterality Date  . Breast surgery Left     milk duct  . Total abdominal hysterectomy w/ bilateral salpingoophorectomy      for heavy periods with appendectomy  . Tonsillectomy    . Colonoscopy    . Left heart catheterization with coronary angiogram N/A 01/27/2015    Procedure: LEFT HEART CATHETERIZATION WITH CORONARY ANGIOGRAM;  Surgeon: [name], MD;  Location[facility] CATH LAB;  Service: Cardiovascular;  Laterality: N/A;    Family history: family history includes Breast cancer in her maternal aunt; Diabetes in her mother; Heart disease in her  maternal grandmother, mother, and other; Hyperlipidemia in her mother; Hypertension in her mother; Kidney disease in her father and mother.  Social History: Patient lives alone.  She is [place].  Remote former smoker.  Has children in [place]. Lives independently, independent with ADLs and walks without a walker.       Physical Exam: BP 151/79 mmHg  Pulse 97  Temp(Src) 98.6 F (37 C) (Oral)  Resp 19  Ht 5' 4.5" (1.638 m)  Wt 101.152 kg (223 lb)  BMI 37.70 kg/m2  SpO2 96% General appearance: Well-developed, obese adult female, alert and in mild distress from anxiety and dyspnea/tachypnea.   Eyes: Anicteric, conjunctiva pink, lids and lashes normal.     ENT: No nasal deformity, discharge, or epistaxis.  OP moist without lesions.   Lymph: No cervical or supraclavicular lymphadenopathy. Skin: Warm and dry.  No jaundice.  No suspicious rashes or lesions. Cardiac: Tachycardic, occasional premature contractions, nl S1-S2, 1/6 SEM at base.  Capillary refill is brisk.  JVP not visible.  No LE edema.  Radial and DP pulses 2+ and symmetric. Respiratory: Normal respiratory rate and rhythm.  No wheezes.  Bilateral crackles, as atelectasis, at bases. Abdomen: Abdomen soft without rigidity.  No TTP. Ascites difficult to assess given habitus, somewhat distended.   MSK: No deformities or effusions. Neuro: Sensorium intact and responding to questions, attention normal.  Speech is fluent.  Moves all extremities equally and with normal coordination.    Psych: Behavior appropriate.  Affect anxious.  No evidence of aural or visual hallucinations or delusions.       Labs on Admission:  The metabolic panel shows hyperglycemia, pseudohyponatremia, mild low bicarbonate, slight increase in creatinine is not AKI. Troponin normal. BNP is 77 The complete blood count shows chronic leukocytosis and thrombocytosis, normal hemoglobin.   Radiological Exams on Admission: Personally  reviewed: Dg Chest 2 View 01/28/2016   No focal opacity or edema.  Ct Angio Chest Pe W/cm &/or Wo Cm 01/28/2016   IMPRESSION: No demonstrable pulmonary embolus. Prominence of the ascending thoracic aorta with a maximum transverse diameter of 4.1 x 3.4 cm. Recommend annual imaging followup by CTA or MRA. This recommendation follows 2010 ACCF/AHA/AATS/ACR/ASA/SCA/SCAI/SIR/STS/SVM Guidelines for the Diagnosis and Management of Patients with Thoracic Aortic Disease. Circulation. 2010; 121: K539-J673 No parenchymal lung edema or consolidation. 7 x 6 mm nodular opacity is noted in superior segment right lower lobe. Non-contrast chest CT at 6-12 months is recommended. If the nodule is stable at time of repeat CT, then future CT at 18-24 months (from today's scan) is considered optional for low-risk patients, but is recommended for high-risk patients. This recommendation follows the consensus statement: Guidelines for Management of Incidental Pulmonary Nodules Detected on CT Images:From the Fleischner Society 2017; published online before print (10.1148/radiol.[id number]). No demonstrable adenopathy. Evidence of hepatic cirrhosis with splenomegaly. Scattered foci of atherosclerotic calcification in the aorta. There are scattered foci of coronary artery calcification. There is a minimal pericardial effusion with areas of pericardial calcification posteriorly and laterally. Electronically Signed   By: Lowella Grip III M.D.   On: 01/28/2016 20:28    EKG: Independently reviewed. Rate 96, QTC 477, LAFB. No ischemic changes.    Assessment/Plan 1. Dyspnea:  This is new.  Etiology not clear.  Has been worsening over 2-3 weeks.  Could be multifactorial from weight, diastolic CHF.  Has remote history of asthma but no wheezing on exam.  Not clear that this isn't anginal equivalent.  Also, possibly related to cirrhosis/ascites given positional nature of her dyspnea.  Possibly psychogenic component, as she has had  significant anxiety/homesick for [place] this holiday.  No PE or pneumonia or pulmonary edema or interstitial PNA on CT angiogram. -Will trial albuterol for therapeutic and diagnostic benefit.     -Cycle cardiac enzymes and consult to Cardiology, appreciate cares and recommendations -Abdomen US to evaluate for ascites -Echocardiogram ordered   2. IDDM with hyperglycemia:  BG >500 at admission. -Continue home U500 100 units at breakfast and 85 units at night -Sliding scale corrections with meals  3. HTN:  -Continue home losartan and amlodipine  4. CAD s/p PCI:  STEMI and DES in April 2016.  Echo with EF 50% since then.   -Continue home Brilinta and aspirin -Continue statin  and BP meds  5. Chronic diastolic CHF: BNP low, CXR without edema.  Volume status difficult to assess given habitus.  6. 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   -[place] for ascites as cause of dyspnea, as above  7. [name]: Not clear that this contributing to presenting complaint  -Continue home Jakafi  8. Chronic leukocytosis: Somewhat higher than usual.  No obvious infection on CXR.  Had dysuria recently but UA normal and not treated, symptoms resolved, no current symptoms of UTI.    9. CKD: Baseline Cr 1.0, now around 1.2, eGFR 45.  10. Aortic dilation: Prominence of the ascending thoracic aorta with a maximum transverse diameter of 4.1 x 3.4 cm. Recommend annual imaging followup by CTA or MRA -Follow up in 1 year  11. Lung nodule: 7 x 6 mm nodular opacity is noted in superior segment right lower lobe.  -Non-contrast chest CT at 6-12 months is recommended. If the nodule is stable at time of repeat CT, then future CT at 18-24 months (from today's scan) is considered optional for low-risk patients, but is recommended for high-risk patients.    DVT PPx: Lovenox Diet: Diabetic, heart healthy Consultants: Cardiology Code Status: FULL Family Communication: None present  Medical decision making:  What exists of the patient's previous chart was reviewed in depth and the case was discussed with Dr. Winfred Leeds. Patient seen 11:28 PM on 01/28/2016.  Disposition Plan:  I recommend admission to telemetry, observation status.  Clinical condition: stable.  Anticipate studies and echocardiogram in AM.  If albuterol doesn't help, echo and US abdomen are unremarkable, and Cardiology has no concerns, can discharge to home with PCP follow up.      Edwin Dada [facility] Pager [phone]

## 2016-01-28 NOTE — Progress Notes (Signed)
*  Preliminary Results* Left lower extremity venous duplex completed. Left lower extremity is negative for deep vein thrombosis. There is no evidence of left Baker's cyst.  01/28/2016 6:33 PM  Maudry Mayhew, RVT, RDCS, RDMS

## 2016-01-28 NOTE — Telephone Encounter (Signed)
Called pt back and left a message to call back. Did not call pt's husband, he is not listed as DPR with [facility] at this time.

## 2016-01-29 ENCOUNTER — Observation Stay (HOSPITAL_BASED_OUTPATIENT_CLINIC_OR_DEPARTMENT_OTHER): Payer: Medicare Other

## 2016-01-29 ENCOUNTER — Other Ambulatory Visit (HOSPITAL_COMMUNITY): Payer: Medicare Other

## 2016-01-29 ENCOUNTER — Observation Stay (HOSPITAL_COMMUNITY): Payer: Medicare Other

## 2016-01-29 DIAGNOSIS — I10 Essential (primary) hypertension: Secondary | ICD-10-CM

## 2016-01-29 DIAGNOSIS — D45 Polycythemia vera: Secondary | ICD-10-CM

## 2016-01-29 DIAGNOSIS — I509 Heart failure, unspecified: Secondary | ICD-10-CM | POA: Diagnosis not present

## 2016-01-29 DIAGNOSIS — R0600 Dyspnea, unspecified: Secondary | ICD-10-CM | POA: Diagnosis not present

## 2016-01-29 DIAGNOSIS — D72829 Elevated white blood cell count, unspecified: Secondary | ICD-10-CM | POA: Diagnosis not present

## 2016-01-29 DIAGNOSIS — E1149 Type 2 diabetes mellitus with other diabetic neurological complication: Secondary | ICD-10-CM | POA: Diagnosis not present

## 2016-01-29 DIAGNOSIS — K7460 Unspecified cirrhosis of liver: Secondary | ICD-10-CM | POA: Diagnosis not present

## 2016-01-29 DIAGNOSIS — I2510 Atherosclerotic heart disease of native coronary artery without angina pectoris: Secondary | ICD-10-CM | POA: Diagnosis not present

## 2016-01-29 LAB — BASIC METABOLIC PANEL
ANION GAP: 15 (ref 5–15)
BUN: 17 mg/dL (ref 6–20)
CALCIUM: 8.7 mg/dL — AB (ref 8.9–10.3)
CHLORIDE: 101 mmol/L (ref 101–111)
CO2: 17 mmol/L — AB (ref 22–32)
Creatinine, Ser: 1.14 mg/dL — ABNORMAL HIGH (ref 0.44–1.00)
GFR calc Af Amer: 53 mL/min — ABNORMAL LOW (ref >60)
GFR, EST NON AFRICAN AMERICAN: 46 mL/min — AB (ref >60)
GLUCOSE: 585 mg/dL — AB (ref 65–99)
POTASSIUM: 5.1 mmol/L (ref 3.5–5.1)
Sodium: 133 mmol/L — ABNORMAL LOW (ref 135–145)

## 2016-01-29 LAB — URINE MICROSCOPIC-ADD ON: RBC / HPF: NONE SEEN RBC/hpf (ref 0–5)

## 2016-01-29 LAB — TROPONIN I
TROPONIN I: 0.03 ng/mL (ref <0.031)
Troponin I: 0.04 ng/mL — ABNORMAL HIGH (ref <0.031)

## 2016-01-29 LAB — URINALYSIS, ROUTINE W REFLEX MICROSCOPIC
BILIRUBIN URINE: NEGATIVE
Glucose, UA: >1000 mg/dL — AB
Hgb urine dipstick: NEGATIVE
KETONES UR: NEGATIVE mg/dL
Leukocytes, UA: NEGATIVE
NITRITE: NEGATIVE
PH: 5.5 (ref 5.0–8.0)
Protein, ur: NEGATIVE mg/dL
Specific Gravity, Urine: 1.03 (ref 1.005–1.030)

## 2016-01-29 LAB — GLUCOSE, CAPILLARY
GLUCOSE-CAPILLARY: 133 mg/dL — AB (ref 65–99)
GLUCOSE-CAPILLARY: 288 mg/dL — AB (ref 65–99)
GLUCOSE-CAPILLARY: 345 mg/dL — AB (ref 65–99)
GLUCOSE-CAPILLARY: 420 mg/dL — AB (ref 65–99)
GLUCOSE-CAPILLARY: 546 mg/dL — AB (ref 65–99)
Glucose-Capillary: >600 mg/dL (ref 65–99)

## 2016-01-29 LAB — CBC
HEMATOCRIT: 37.6 % (ref 36.0–46.0)
Hemoglobin: 12.1 g/dL (ref 12.0–15.0)
MCH: 28 pg (ref 26.0–34.0)
MCHC: 32.2 g/dL (ref 30.0–36.0)
MCV: 87 fL (ref 78.0–100.0)
PLATELETS: 573 10*3/uL — AB (ref 150–400)
RBC: 4.32 MIL/uL (ref 3.87–5.11)
RDW: 18.8 % — AB (ref 11.5–15.5)
WBC: 20.2 10*3/uL — ABNORMAL HIGH (ref 4.0–10.5)

## 2016-01-29 LAB — CREATININE, SERUM
Creatinine, Ser: 1.04 mg/dL — ABNORMAL HIGH (ref 0.44–1.00)
GFR calc Af Amer: 59 mL/min — ABNORMAL LOW (ref >60)
GFR calc non Af Amer: 51 mL/min — ABNORMAL LOW (ref >60)

## 2016-01-29 LAB — ECHOCARDIOGRAM COMPLETE
Height: 64.5 in
Weight: 3555.2 oz

## 2016-01-29 LAB — MRSA PCR SCREENING: MRSA by PCR: POSITIVE — AB

## 2016-01-29 MED ORDER — INSULIN ASPART 100 UNIT/ML ~~LOC~~ SOLN
0.0000 [IU] | Freq: Three times a day (TID) | SUBCUTANEOUS | Status: DC
  Administered 2016-01-29: 20 [IU] via SUBCUTANEOUS
  Administered 2016-01-29: 11 [IU] via SUBCUTANEOUS
  Administered 2016-01-30: 7 [IU] via SUBCUTANEOUS
  Administered 2016-01-30: 11 [IU] via SUBCUTANEOUS

## 2016-01-29 MED ORDER — LOSARTAN POTASSIUM 50 MG PO TABS
100.0000 mg | ORAL_TABLET | Freq: Every day | ORAL | Status: DC
  Administered 2016-01-29 – 2016-01-30 (×2): 100 mg via ORAL
  Filled 2016-01-29 (×2): qty 2

## 2016-01-29 MED ORDER — ALBUTEROL SULFATE (2.5 MG/3ML) 0.083% IN NEBU: 2.5000 mg | INHALATION_SOLUTION | Freq: Once | RESPIRATORY_TRACT | Status: DC

## 2016-01-29 MED ORDER — INSULIN REGULAR HUMAN (CONC) 500 UNIT/ML ~~LOC~~ SOPN
100.0000 [IU] | PEN_INJECTOR | Freq: Every day | SUBCUTANEOUS | Status: DC
  Administered 2016-01-29 – 2016-01-30 (×2): 100 [IU] via SUBCUTANEOUS
  Filled 2016-01-29: qty 3

## 2016-01-29 MED ORDER — AMLODIPINE BESYLATE 5 MG PO TABS
5.0000 mg | ORAL_TABLET | Freq: Every day | ORAL | Status: DC
  Administered 2016-01-29 – 2016-01-30 (×2): 5 mg via ORAL
  Filled 2016-01-29 (×2): qty 1

## 2016-01-29 MED ORDER — ALBUTEROL SULFATE (2.5 MG/3ML) 0.083% IN NEBU: 2.5000 mg | INHALATION_SOLUTION | RESPIRATORY_TRACT | Status: DC | PRN

## 2016-01-29 MED ORDER — ACETAMINOPHEN 325 MG PO TABS: 650.0000 mg | ORAL_TABLET | Freq: Four times a day (QID) | ORAL | Status: DC | PRN

## 2016-01-29 MED ORDER — ACETAMINOPHEN 650 MG RE SUPP
650.0000 mg | Freq: Four times a day (QID) | RECTAL | Status: DC | PRN
Start: 2016-01-29 — End: 2016-01-30

## 2016-01-29 MED ORDER — DICYCLOMINE HCL 10 MG PO CAPS: 10.0000 mg | ORAL_CAPSULE | Freq: Two times a day (BID) | ORAL | Status: DC | PRN

## 2016-01-29 MED ORDER — ENOXAPARIN SODIUM 40 MG/0.4ML ~~LOC~~ SOLN: 40.0000 mg | SUBCUTANEOUS | Status: DC

## 2016-01-29 MED ORDER — CHLORHEXIDINE GLUCONATE CLOTH 2 % EX PADS
6.0000 | MEDICATED_PAD | Freq: Every day | CUTANEOUS | Status: DC
  Administered 2016-01-29 – 2016-01-30 (×2): 6 via TOPICAL

## 2016-01-29 MED ORDER — INSULIN REGULAR HUMAN (CONC) 500 UNIT/ML ~~LOC~~ SOPN
80.0000 [IU] | PEN_INJECTOR | Freq: Every day | SUBCUTANEOUS | Status: DC
  Administered 2016-01-29: 80 [IU] via SUBCUTANEOUS
  Filled 2016-01-29: qty 3

## 2016-01-29 MED ORDER — MUPIROCIN 2 % EX OINT
1.0000 "application " | TOPICAL_OINTMENT | Freq: Two times a day (BID) | CUTANEOUS | Status: DC
  Administered 2016-01-29 – 2016-01-30 (×3): 1 via NASAL
  Filled 2016-01-29: qty 22

## 2016-01-29 MED ORDER — INSULIN ASPART 100 UNIT/ML ~~LOC~~ SOLN
12.0000 [IU] | Freq: Once | SUBCUTANEOUS | Status: AC
  Administered 2016-01-29: 12 [IU] via SUBCUTANEOUS

## 2016-01-29 MED ORDER — RUXOLITINIB PHOSPHATE 25 MG PO TABS
25.0000 mg | ORAL_TABLET | Freq: Two times a day (BID) | ORAL | Status: DC
  Administered 2016-01-29 – 2016-01-30 (×2): 25 mg via ORAL
  Filled 2016-01-29 (×4): qty 1

## 2016-01-29 MED ORDER — INSULIN ASPART 100 UNIT/ML ~~LOC~~ SOLN
25.0000 [IU] | Freq: Once | SUBCUTANEOUS | Status: AC
  Administered 2016-01-29: 25 [IU] via SUBCUTANEOUS

## 2016-01-29 MED ORDER — ROSUVASTATIN CALCIUM 20 MG PO TABS
20.0000 mg | ORAL_TABLET | Freq: Every day | ORAL | Status: DC
  Administered 2016-01-29: 20 mg via ORAL
  Filled 2016-01-29 (×2): qty 1

## 2016-01-29 MED ORDER — TICAGRELOR 90 MG PO TABS
90.0000 mg | ORAL_TABLET | Freq: Two times a day (BID) | ORAL | Status: DC
  Administered 2016-01-29 – 2016-01-30 (×3): 90 mg via ORAL
  Filled 2016-01-29 (×3): qty 1

## 2016-01-29 MED ORDER — INSULIN ASPART 100 UNIT/ML ~~LOC~~ SOLN: 0.0000 [IU] | Freq: Three times a day (TID) | SUBCUTANEOUS | Status: DC

## 2016-01-29 MED ORDER — SENNOSIDES-DOCUSATE SODIUM 8.6-50 MG PO TABS: 1.0000 | ORAL_TABLET | Freq: Every evening | ORAL | Status: DC | PRN

## 2016-01-29 NOTE — Progress Notes (Addendum)
Pt CBG 546, Dr. Earlie Counts paged. Sliding scale ordered. To give 20 units novolog now and check sugar in 1 hour. Will continue to monitor.  1300 rechecked CBG, blood glucose >600 "too high to read". Blood glucose lab ordered. Will continue to monitor.  CRITICAL VALUE ALERT Critical value received:  Blood glucose 585  Date of notification:  01/29/16  Time of notification:  1338  Critical value read back: yes  Nurse who received alert:  Maurene Capes  MD notified (1st page):  Dr. Earlie Counts  Time of first page:  1340

## 2016-01-29 NOTE — Progress Notes (Signed)
Pt a/o, no c/o pain, hs CBG 133, VSS, pt stable, pt hoping to go home tom,

## 2016-01-29 NOTE — Progress Notes (Signed)
Pt admitted from ED with SOB, alert and oriented, denies any pain, pt settled in bed, tele monitor put on pt, v/s srable, will however continue to monitor. Obasog[place]-Asidi, Samentha Perham Efe

## 2016-01-29 NOTE — Consult Note (Signed)
Patient ID: Amanda [name] MRN: [id number], DOB/AGE: [birth year]   Admit date: 01/28/2016   Primary Physician: Beatrice Lecher, MD Primary Cardiologist: Dr. [name]  Pt. Profile:  75 y/o female with h/o CAD s/p STEMI 01/2015 resulting in PCI + DES to mid and distal LCx, normal EF, HTN, HLD, IDDM, remote h/o asthma, anxiety and non compliance with ASA, admitted for worsening dyspnea on exertion.   Problem List  Past Medical History  Diagnosis Date  . Hypertension   . Hyperlipidemia   . Diabetes mellitus 2008  . Depression   . Obesity   . Psoriasis   . Polycythemia     Dr. Elease Hashimoto- [facility]  . Herpes simplex   . Gout   . Hyperglycemia 05/31/2013  . C. difficile colitis   . Cirrhosis ([place])   . Cancer ([place])     SKIN  . Neuromuscular disorder ([place])     BELL PALSY  . Candidiasis of skin 09/30/2014  . Myocardial infarction ([place])   . Allergic rhinitis 01/23/2016    Past Surgical History  Procedure Laterality Date  . Breast surgery Left     milk duct  . Total abdominal hysterectomy w/ bilateral salpingoophorectomy      for heavy periods with appendectomy  . Tonsillectomy    . Colonoscopy    . Left heart catheterization with coronary angiogram N/A 01/27/2015    Procedure: LEFT HEART CATHETERIZATION WITH CORONARY ANGIOGRAM;  Surgeon: [name], MD;  Location: [facility] CATH LAB;  Service: Cardiovascular;  Laterality: N/A;     Allergies  Allergies  Allergen Reactions  . Lisinopril Cough  . Tape Hives  . Doxycycline Hives, Swelling and Rash  . Latex Hives, Itching and Rash    HPI  75 y/o female, followed by Dr. [name], who was admitted by IM 01/28/16 for evaluation for dyspnea.   Her PMH is notable for CAD s/p STEMI 01/2015 secondary to severe single vessel CAD s/p PCI + DES to the mid and distal LCX. She had a normal LAD and 20% mid RCA disease. LVEF was normal at 50-55%. She also has a h/o treated HTN, HLD and IDDM. Her last OV with Dr. [name] was in 07/2015. At  that time, she was on appropriate medical therapy including ASA, Brilinta, losartan, metoprolol, crestor and amlodipine. However, since that time, she reports that her metoprolol was discontinued by her endocrinologist, for reasons unknown.  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 Also with recent stress/ anxiety. She has family in [place] that she misses dearly. She has also been doing a lot of cooking amd cleaning at home and thinks that she may have overworked herself.   She notes the development of DOE over the last 4 weeks that has progressively worsened. She denies resting dyspnea. No orthopnea, PND or LEE. She has noticed intermitted substernal chest pressure that feels like an "elephant sitting on my chest". This can occur at rest and with exertion. It only last for less than 1 min. She has not had to use SL NTG. She has not been fully compliant with meds. She ran out of ASA about 2 weeks ago. She has been compliant with Brilinta. She is no longer on metoprolol, as outlined above. She has also noticed some wheezing. She saw her PCP with these complaints and she was prescribed PO Lasix. This helped her dyspnea some, however she had to discontinue Lasix given severe leg cramps in the setting of hypokalemia. Given persistent symptoms,  she presented to the ED for evaluation.   In ED, CT of chest was negative for PE, however did also show small thoracic aneurysm and small subcentimeter nodule, outpatient follow up recommended. BNP was WNL at 77.9. CXR negative for edema. CBC shows normal hgb. She also appears to have chronic leukocytosis, somewhat higher than usual (patient was recently placed on prednisone by her oncologist). No obvious infection on CXR. She had dysuria recently but UA normal and not treated, symptoms resolved, no current symptoms of UTI. EKGs have shown NSR with no ST abnormalities. POC troponin was negative. Initial lab troponin negative x 1.  She is currently  asymptomatic at rest. O2 sats are stable at 96% on RA.   Home Medications  Prior to Admission medications   Medication Sig Start Date End Date Taking? Authorizing Provider  dicyclomine (BENTYL) 10 MG capsule Take 1 tab by mouth every morning, may take twice daily as needed. 05/15/15  Yes Amy S Esterwood, PA-C  furosemide (LASIX) 20 MG tablet Take 1 tablet (20 mg total) by mouth daily. 01/26/16  Yes Hali Marry, MD  insulin regular human CONCENTRATED (HUMULIN R U-500 KWIKPEN) 500 UNIT/ML kwikpen Inject 100 units under the skin before breakfast and 85units before dinner. Patient taking differently: Inject 100 units under the skin before breakfast and 80 units before dinner. 11/27/15  Yes [name], MD  loperamide (IMODIUM A-D) 2 MG tablet Take 2 mg by mouth daily as needed for diarrhea or loose stools.    Yes Historical Provider, MD  losartan (COZAAR) 100 MG tablet Take 1 tablet (100 mg total) by mouth daily. 01/26/16  Yes Hali Marry, MD  nitroGLYCERIN (NITROSTAT) 0.4 MG SL tablet Place 1 tablet (0.4 mg total) under the tongue every 5 (five) minutes as needed for chest pain. 02/06/15  Yes [name], MD  NONFORMULARY OR COMPOUNDED ITEM Neuropathy cream: mix Amitriptyline 2%, Lidocaine 2%, and Ketamine 1% - apply on feet 2x a day [facility]. 01/23/15  Yes [name], MD  omega-3 acid ethyl esters (LOVAZA) 1 G capsule Take 1 capsule (1 g total) by mouth 2 (two) times daily. 02/07/15  Yes [name], MD  rosuvastatin (CRESTOR) 20 MG tablet Take 1 tablet (20 mg total) by mouth daily. 05/12/15  Yes [name], MD  ruxolitinib phosphate (JAKAFI) 25 MG tablet Take 1 tablet (25 mg total) by mouth 2 (two) times daily. 10/16/15  Yes [name], MD  ticagrelor (BRILINTA) 90 MG TABS tablet Take 1 tablet (90 mg total) by mouth 2 (two) times daily. 02/06/15  Yes [name], MD  amLODipine (NORVASC) 5 MG tablet Take 1 tablet (5 mg total) by mouth daily. 01/26/16    Hali Marry, MD    Family History  Family History  Problem Relation Age of Onset  . Diabetes Mother   . Heart disease Mother     CAD  . Hyperlipidemia Mother   . Hypertension Mother   . Kidney disease Mother   . Kidney disease Father   . Breast cancer Maternal Aunt   . Heart disease Maternal Grandmother   . Heart disease Other     maternal aunts and uncles    Social History  Social History   Social History  . Marital Status: Married    Spouse Name: N/A  . Number of Children: 2  . Years of Education: 2 yr colle   Occupational History  . housewife    Social History Main  Topics  . Smoking status: Former Smoker -- 48 years    Types: Cigarettes    Quit date: 10/18/1986  . Smokeless tobacco: Never Used  . Alcohol Use: No  . Drug Use: No  . Sexual Activity:    Partners: Male    Birth Control/ Protection: None   Other Topics Concern  . Not on file   Social History Narrative   2 caffeine drink per day.  No regular exercise.       Review of Systems General:  No chills, fever, night sweats or weight changes.  Cardiovascular:  + for chest pain, + dyspnea on exertion, no edema, orthopnea, palpitations, paroxysmal nocturnal dyspnea. Dermatological: No rash, lesions/masses Respiratory: No cough, + for dyspnea Urologic: No hematuria, dysuria Abdominal:   No nausea, vomiting, diarrhea, bright red blood per rectum, melena, or hematemesis Neurologic:  No visual changes, wkns, changes in mental status. All other systems reviewed and are otherwise negative except as noted above.  Physical Exam  Blood pressure 157/99, pulse 97, temperature 98.3 F (36.8 C), temperature source Oral, resp. rate 22, height 5' 4.5" (1.638 m), weight 222 lb 3.2 oz (100.789 kg), SpO2 96 %.  General: Pleasant, NAD Psych: Normal affect. Neuro: Alert and oriented X 3. Moves all extremities spontaneously. HEENT: Normal  Neck: Supple without bruits or JVD. Lungs:  Resp regular and  unlabored, CTA. Heart: RRR no s3, s4, or murmurs. Abdomen: Soft, non-tender, non-distended, BS + x 4.  Extremities: No clubbing, cyanosis or edema. DP/PT/Radials 2+ and equal bilaterally.  Labs  Troponin [facility] Test)  Recent Labs  01/28/16 1407  TROPIPOC 0.02    Recent Labs  01/29/16 0059  TROPONINI 0.03   Lab Results  Component Value Date   WBC 20.2* 01/29/2016   HGB 12.1 01/29/2016   HCT 37.6 01/29/2016   MCV 87.0 01/29/2016   PLT 573* 01/29/2016    Recent Labs Lab 01/26/16 1340  01/28/16 1353 01/29/16 0059  NA 131*  --  134*  --   K 4.5  --  4.1  --   CL 98  --  101  --   CO2 23  --  19*  --   BUN 16  --  14  --   CREATININE 1.27*  < > 1.18* 1.04*  CALCIUM 8.8  --  8.7*  --   PROT 6.5  --   --   --   BILITOT 0.7  --   --   --   ALKPHOS 106  --   --   --   ALT 22  --   --   --   AST 32  --   --   --   GLUCOSE 477*  --  521*  --   < > = values in this interval not displayed. Lab Results  Component Value Date   CHOL 168 01/27/2015   HDL 27* 01/27/2015   LDLCALC 88 01/27/2015   TRIG 266* 01/27/2015   No results found for: DDIMER   Radiology/Studies  Dg Chest 2 View  01/28/2016  CLINICAL DATA:  Shortness of breath and asthma the past 3 days. Cough. Former smoker. History of hypertension and diabetes. EXAM: CHEST  2 VIEW COMPARISON:  01/26/2016; 01/31/2015; 01/29/2014; 05/31/2013 FINDINGS: Grossly unchanged cardiac silhouette and mediastinal contours with atherosclerotic plaque within the thoracic aorta. Lung volumes remain reduced with grossly unchanged bibasilar heterogeneous opacities favored to represent atelectasis or scar. There is unchanged mild diffuse slightly nodular thickening of  the pulmonary interstitium. No discrete focal airspace opacities. No pleural effusion or pneumothorax. There is persistent minimal pleural parenchymal thickening about the bilateral major and the right minor fissures. No evidence of edema. No acute osseous  abnormalities. IMPRESSION: Similar findings of chronic bronchitic change and hypoventilation without definitive superimposed acute cardiopulmonary disease. Electronically Signed   By: Sandi Mariscal M.D.   On: 01/28/2016 14:26   Dg Chest 2 View  01/26/2016  CLINICAL DATA:  Shortness of breath. EXAM: CHEST  2 VIEW COMPARISON:  01/31/2015. FINDINGS: Cardiomegaly with mild bilateral from interstitial prominence. Mild congestive heart failure cannot be excluded. No pleural effusion or pneumothorax . No acute bony abnormality . IMPRESSION: Cardiomegaly with mild bilateral pulmonary interstitial prominence suggesting mild congestive heart failure. Electronically Signed   By: Marcello Moores  Register   On: 01/26/2016 10:26   Ct Angio Chest Pe W/cm &/or Wo Cm  01/28/2016  CLINICAL DATA:  Shortness of breath with 1 week history of left calf region pain EXAM: CT ANGIOGRAPHY CHEST WITH CONTRAST TECHNIQUE: Multidetector CT imaging of the chest was performed using the standard protocol during bolus administration of intravenous contrast. Multiplanar CT image reconstructions and MIPs were obtained to evaluate the vascular anatomy. CONTRAST:  65 mL Isovue 370 nonionic COMPARISON:  Chest radiograph January 28, 2016 FINDINGS: Mediastinum/Lymph Nodes: There is no demonstrable pulmonary embolus. There is prominence of the ascending thoracic aorta with a maximum transverse diameter of 4.1 x 3.4 cm. No dissection evident. There are scattered foci of atherosclerotic calcification in aorta. The visualized great vessels appear unremarkable. There is left ventricular hypertrophy. There are foci of coronary artery calcification bilaterally. There is also mild pericardial calcification along the posterior lateral aspects of the pericardium. There is a minimal pericardial effusion. Thyroid appears unremarkable. There are scattered subcentimeter mediastinal lymph nodes but no adenopathy by size criteria. Lungs/Pleura: There is mild bibasilar  atelectasis. There is no parenchymal lung edema or consolidation. On axial slice 44 series 5, there is a nodular opacity measuring 7 x 6 mm in the superior segment of the right lower lobe. Upper abdomen: In the visualized upper abdomen, the liver has a nodular contour with prominence of the caudate lobe, findings indicative of hepatic cirrhosis. Spleen is incompletely visualized but is noted to be enlarged. There is a calculus in the mid right kidney measuring 8 x 3 mm. There is atherosclerotic calcification in the aorta. Visualized upper abdominal structures otherwise appear unremarkable. Musculoskeletal: There are no blastic or lytic bone lesions. There is degenerative change in thoracic spine. Review of the MIP images confirms the above findings. IMPRESSION: No demonstrable pulmonary embolus. Prominence of the ascending thoracic aorta with a maximum transverse diameter of 4.1 x 3.4 cm. Recommend annual imaging followup by CTA or MRA. This recommendation follows 2010 ACCF/AHA/AATS/ACR/ASA/SCA/SCAI/SIR/STS/SVM Guidelines for the Diagnosis and Management of Patients with Thoracic Aortic Disease. Circulation. 2010; 121: W119-J478 No parenchymal lung edema or consolidation. 7 x 6 mm nodular opacity is noted in superior segment right lower lobe. Non-contrast chest CT at 6-12 months is recommended. If the nodule is stable at time of repeat CT, then future CT at 18-24 months (from today's scan) is considered optional for low-risk patients, but is recommended for high-risk patients. This recommendation follows the consensus statement: Guidelines for Management of Incidental Pulmonary Nodules Detected on CT Images:From the Fleischner Society 2017; published online before print (10.1148/radiol.[id number]). No demonstrable adenopathy. Evidence of hepatic cirrhosis with splenomegaly. Scattered foci of atherosclerotic calcification in the aorta. There are scattered foci of  coronary artery calcification. There is a minimal  pericardial effusion with areas of pericardial calcification posteriorly and laterally. Electronically Signed   By: Lowella Grip III M.D.   On: 01/28/2016 20:28   US Abdomen Limited  01/29/2016  CLINICAL DATA:  Assess for ascites. Current history of hepatic cirrhosis. Initial encounter. EXAM: LIMITED ABDOMEN ULTRASOUND FOR ASCITES TECHNIQUE: Limited ultrasound survey for ascites was performed in all four abdominal quadrants. COMPARISON:  Abdominal ultrasound performed 09/23/2015 FINDINGS: There is no evidence of ascites within the 4 quadrants of the abdomen. IMPRESSION: No evidence of ascites. Electronically Signed   By: Garald Balding M.D.   On: 01/29/2016 03:15    ECG  NSR. 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 DOE: Chest CT is negative for PE. CBC shows no anemia. CXR w/o edema. No infection. BNP is WNL at 79. EKG/ telemetry has shown NSR. No arrhthymias. POC troponin is negative. Actual lab troponin is negative x 1. 2D echo pending. Continue to cycle cardiac enzymes x 3. Agree with 2D echo to reassess LVF, wall motion, ther pericardium and valve anatomy. Also given recent life stressors, anxiety could be a contributing factor.   2. CAD: h/o STEMI 1 year ago. S/p PCI +DES to mid-distal LCx. Patient admits to being non compliant with ASA over the last 2 weeks. She has been compliant with Brilinta. EKG w/o ST changes. Initial troponin is negative. Continue to cycle enzymes and await echo findings. If she rules out, will need to consider NST to assess for ischemia. Obviously, if she rules in or if echo shows drop in EF, will need to consider cath, as coronary ischemia could  be the cause of her symptoms. We discussed the importance of strict compliance with DAPT. Continue Crestor and losartan. Unsure why her metoprolol was discontinued. It is not listed as an allergy/intolerance. Telemetry shows HR in the 90s. BP is mild-moderately elevated. We need to consider restarting this.   Signed, Lyda Jester, PA-C 01/29/2016, 11:30 AM

## 2016-01-29 NOTE — Progress Notes (Signed)
[facility] PROGRESS NOTE  GENOVA KINER IHW:[id number] DOB: May 02, 1941 DOA: 01/28/2016 PCP: Beatrice Lecher, MD  HPI/Brief narrative 75 y.o. 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  Assessment/Plan: 1. Dyspnea:  Unclear etiology. Reportedly has been worsening over the past 2-3 weeks. Has remote history of asthma but no wheezing on exam.Cardiology consulted. Likely not CHF -Given trial of albuterol, reports feeling improved this AM  -Abdomen US to neg for ascites -Echocardiogram ordered, EF of 60-65%  2. IDDM with hyperglycemia:  BG >500 at admission. -Patient was continued on home U500 100 units at breakfast and 85 units at night -Sliding scale added with meals - Today, patient noted to have difficult to control glucose of over 600. Have consulted diabetic coordinator. Follow up BMET is pending. Seems poorly tolerant to subq aspart. Will likely end up needing insulin gtt for tight glycemic control. Last a1c from 1/17 noted to be 9.6  3. HTN:  -Continue home losartan and amlodipine  4. CAD s/p PCI:  STEMI and DES in April 2016. Echo with EF 50% since then.  -Continue home Brilinta and aspirin -Continue statin and BP meds as tolerated  5. Chronic diastolic CHF: BNP low, CXR without edema.Per cardiology, pt either euvolemic vs mildly hypovolemic  6. 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  -[place] neg for ascites  7. [name]: Not clear that this contributing to presenting complaint  -Continue home Jakafi  8. Chronic leukocytosis: Somewhat higher than usual. No obvious infection on CXR. Had dysuria recently but UA normal and not treated, symptoms resolved, no current symptoms of UTI.   9. CKD: Baseline Cr 1.0, now around 1.2, eGFR 45.  10. Aortic dilation: Prominence of the  ascending thoracic aorta with a maximum transverse diameter of 4.1 x 3.4 cm. Recommend annual imaging followup by CTA or MRA -Follow up in 1 year  11. Lung nodule: 7 x 6 mm nodular opacity is noted in superior segment right lower lobe.  -Non-contrast chest CT at 6-12 months is recommended. If the nodule is stable at time of repeat CT, then future CT at 18-24 months (from today's scan) is considered optional for low-risk patients, but is recommended for high-risk patients.  Code Status: Full Family Communication: Pt in room, family at bedside Disposition Plan: Possible in 24hrs   Consultants:  Cardiology  Procedures:    Antibiotics: Anti-infectives    None      HPI/Subjective: Feels better today. Eager to go home  Objective: Filed Vitals:   01/29/16 0620 01/29/16 0817 01/29/16 0821 01/29/16 0824  BP: 124/49 131/116 140/111 157/99  Pulse: 95 95 101 97  Temp: 98.7 F (37.1 C) 98.3 F (36.8 C)    TempSrc: Oral Oral    Resp: 22 22    Height:      Weight: 100.789 kg (222 lb 3.2 oz)     SpO2: 92% 96%      Intake/Output Summary (Last 24 hours) at 01/29/16 1529 Last data filed at 01/29/16 1415  Gross per 24 hour  Intake   1120 ml  Output      0 ml  Net   1120 ml   Filed Weights   01/28/16 1342 01/29/16 0620  Weight: 101.152 kg (223 lb) 100.789 kg (222 lb 3.2 oz)    Exam:   General:  Awake, in nad  Cardiovascular: regular, s1,  s2  Respiratory: normal resp effort, no wheezing  Abdomen: soft, nondistended  Musculoskeletal: perfused, no clubbing   Data Reviewed: Basic Metabolic Panel:  Recent Labs Lab 01/26/16 1340 01/28/16 1353 01/29/16 0059  NA 131* 134*  --   K 4.5 4.1  --   CL 98 101  --   CO2 23 19*  --   GLUCOSE 477* 521*  --   BUN 16 14  --   CREATININE 1.27* 1.18* 1.04*  CALCIUM 8.8 8.7*  --    Liver Function Tests:  Recent Labs Lab 01/26/16 1340  AST 32  ALT 22  ALKPHOS 106  BILITOT 0.7  PROT 6.5  ALBUMIN 4.1   No results  for input(s): LIPASE, AMYLASE in the last 168 hours. No results for input(s): AMMONIA in the last 168 hours. CBC:  Recent Labs Lab 01/26/16 1340 01/28/16 1353 01/29/16 0059  WBC 19.0* 19.5* 20.2*  NEUTROABS 15960*  --   --   HGB 11.9 12.4 12.1  HCT 39.0 38.4 37.6  MCV 93.5 88.5 87.0  PLT 543* 523* 573*   Cardiac Enzymes:  Recent Labs Lab 01/29/16 0059  TROPONINI 0.03   BNP (last 3 results)  Recent Labs  01/28/16 1647  BNP 77.9    ProBNP (last 3 results) No results for input(s): PROBNP in the last 8760 hours.  CBG:  Recent Labs Lab 01/28/16 2159 01/29/16 0048 01/29/16 0619 01/29/16 1134 01/29/16 1300  GLUCAP 297* 420* 345* 546* >600*    Recent Results (from the past 240 hour(s))  Urine Culture     Status: None   Collection Time: 01/26/16 10:05 AM  Result Value Ref Range Status   Colony Count NO GROWTH  Final   Organism ID, Bacteria NO GROWTH  Final  MRSA PCR Screening     Status: Abnormal   Collection Time: 01/29/16  1:00 AM  Result Value Ref Range Status   MRSA by PCR POSITIVE (A) NEGATIVE Final    Comment:        The GeneXpert MRSA Assay (FDA approved for NASAL specimens only), is one component of a comprehensive MRSA colonization surveillance program. It is not intended to diagnose MRSA infection nor to guide or monitor treatment for MRSA infections. RESULT CALLED TO, READ BACK BY AND VERIFIED WITH: W ASIDR,RN _0  01/29/16 MKELLY      Studies: Dg Chest 2 View  01/28/2016  CLINICAL DATA:  Shortness of breath and asthma the past 3 days. Cough. Former smoker. History of hypertension and diabetes. EXAM: CHEST  2 VIEW COMPARISON:  01/26/2016; 01/31/2015; 01/29/2014; 05/31/2013 FINDINGS: Grossly unchanged cardiac silhouette and mediastinal contours with atherosclerotic plaque within the thoracic aorta. Lung volumes remain reduced with grossly unchanged bibasilar heterogeneous opacities favored to represent atelectasis or scar. There is unchanged  mild diffuse slightly nodular thickening of the pulmonary interstitium. No discrete focal airspace opacities. No pleural effusion or pneumothorax. There is persistent minimal pleural parenchymal thickening about the bilateral major and the right minor fissures. No evidence of edema. No acute osseous abnormalities. IMPRESSION: Similar findings of chronic bronchitic change and hypoventilation without definitive superimposed acute cardiopulmonary disease. Electronically Signed   By: Sandi Mariscal M.D.   On: 01/28/2016 14:26   Ct Angio Chest Pe W/cm &/or Wo Cm  01/28/2016  CLINICAL DATA:  Shortness of breath with 1 week history of left calf region pain EXAM: CT ANGIOGRAPHY CHEST WITH CONTRAST TECHNIQUE: Multidetector CT imaging of the chest was performed using the standard protocol during bolus administration of  intravenous contrast. Multiplanar CT image reconstructions and MIPs were obtained to evaluate the vascular anatomy. CONTRAST:  65 mL Isovue 370 nonionic COMPARISON:  Chest radiograph January 28, 2016 FINDINGS: Mediastinum/Lymph Nodes: There is no demonstrable pulmonary embolus. There is prominence of the ascending thoracic aorta with a maximum transverse diameter of 4.1 x 3.4 cm. No dissection evident. There are scattered foci of atherosclerotic calcification in aorta. The visualized great vessels appear unremarkable. There is left ventricular hypertrophy. There are foci of coronary artery calcification bilaterally. There is also mild pericardial calcification along the posterior lateral aspects of the pericardium. There is a minimal pericardial effusion. Thyroid appears unremarkable. There are scattered subcentimeter mediastinal lymph nodes but no adenopathy by size criteria. Lungs/Pleura: There is mild bibasilar atelectasis. There is no parenchymal lung edema or consolidation. On axial slice 44 series 5, there is a nodular opacity measuring 7 x 6 mm in the superior segment of the right lower lobe. Upper  abdomen: In the visualized upper abdomen, the liver has a nodular contour with prominence of the caudate lobe, findings indicative of hepatic cirrhosis. Spleen is incompletely visualized but is noted to be enlarged. There is a calculus in the mid right kidney measuring 8 x 3 mm. There is atherosclerotic calcification in the aorta. Visualized upper abdominal structures otherwise appear unremarkable. Musculoskeletal: There are no blastic or lytic bone lesions. There is degenerative change in thoracic spine. Review of the MIP images confirms the above findings. IMPRESSION: No demonstrable pulmonary embolus. Prominence of the ascending thoracic aorta with a maximum transverse diameter of 4.1 x 3.4 cm. Recommend annual imaging followup by CTA or MRA. This recommendation follows 2010 ACCF/AHA/AATS/ACR/ASA/SCA/SCAI/SIR/STS/SVM Guidelines for the Diagnosis and Management of Patients with Thoracic Aortic Disease. Circulation. 2010; 121: J287-O676 No parenchymal lung edema or consolidation. 7 x 6 mm nodular opacity is noted in superior segment right lower lobe. Non-contrast chest CT at 6-12 months is recommended. If the nodule is stable at time of repeat CT, then future CT at 18-24 months (from today's scan) is considered optional for low-risk patients, but is recommended for high-risk patients. This recommendation follows the consensus statement: Guidelines for Management of Incidental Pulmonary Nodules Detected on CT Images:From the Fleischner Society 2017; published online before print (10.1148/radiol.7209470962). No demonstrable adenopathy. Evidence of hepatic cirrhosis with splenomegaly. Scattered foci of atherosclerotic calcification in the aorta. There are scattered foci of coronary artery calcification. There is a minimal pericardial effusion with areas of pericardial calcification posteriorly and laterally. Electronically Signed   By: Lowella Grip III M.D.   On: 01/28/2016 20:28   US Abdomen  Limited  01/29/2016  CLINICAL DATA:  Assess for ascites. Current history of hepatic cirrhosis. Initial encounter. EXAM: LIMITED ABDOMEN ULTRASOUND FOR ASCITES TECHNIQUE: Limited ultrasound survey for ascites was performed in all four abdominal quadrants. COMPARISON:  Abdominal ultrasound performed 09/23/2015 FINDINGS: There is no evidence of ascites within the 4 quadrants of the abdomen. IMPRESSION: No evidence of ascites. Electronically Signed   By: Garald Balding M.D.   On: 01/29/2016 03:15    Scheduled Meds: . albuterol  2.5 mg Nebulization Once  . amLODipine  5 mg Oral Daily  . Chlorhexidine Gluconate Cloth  6 each Topical Q0600  . enoxaparin (LOVENOX) injection  40 mg Subcutaneous Q24H  . insulin aspart  0-20 Units Subcutaneous TID WC  . insulin regular human CONCENTRATED  100 Units Subcutaneous Q breakfast  . insulin regular human CONCENTRATED  80 Units Subcutaneous Q supper  . losartan  100 mg Oral  Daily  . mupirocin ointment  1 application Nasal BID  . rosuvastatin  20 mg Oral Daily  . ruxolitinib phosphate  25 mg Oral BID  . ticagrelor  90 mg Oral BID   Continuous Infusions:   Principal Problem:   Dyspnea Active Problems:   Essential hypertension, benign   [name] (HCC)   Leukocytosis   Type 2 diabetes mellitus with neurological manifestations, uncontrolled ([place])   Coronary artery disease involving native coronary artery   Cirrhosis ([place])   Keenan Dimitrov, [facility] Pager [phone]. If 7PM-7AM, please contact night-coverage at [email or web address], password [facility] 01/29/2016, 3:29 PM

## 2016-01-29 NOTE — Progress Notes (Signed)
Pt's CBG read 420, NP Forrest Moron (on call) paged and notified, ordered 12 units of insulin, same given at [place], pt reassured, will continue to monitor. [place]-Asidi, Zareah Hunzeker Efe

## 2016-01-29 NOTE — Progress Notes (Signed)
  Echocardiogram 2D Echocardiogram has been performed.  Jennette Dubin 01/29/2016, 11:08 AM

## 2016-01-29 NOTE — Progress Notes (Signed)
Inpatient Diabetes Program Recommendations  AACE/ADA: New Consensus Statement on Inpatient Glycemic Control (2015)  Target Ranges:  Prepandial:   less than 140 mg/dL      Peak postprandial:   less than 180 mg/dL (1-2 hours)      Critically ill patients:  140 - 180 mg/dL   Results for TABRIA, STEINES (MRN [id number]) as of 01/29/2016 13:47  Ref. Range 01/28/2016 16:55 01/28/2016 21:59 01/29/2016 00:48 01/29/2016 06:19 01/29/2016 11:34 01/29/2016 13:00  Glucose-Capillary Latest Ref Range: 65-99 mg/dL 423 (H) 297 (H) 420 (H) 345 (H) 546 (H) >600 (HH)   Review of Glycemic Control  Diabetes history:  DM 2 Outpatient Diabetes medications: U-500 100 units QAM, 80 units QPM Current orders for Inpatient glycemic control: U-500 100 units QAM, 80 units QPM, Novolog Resistant TID   Inpatient Diabetes Program Recommendations:   Spoke with Dr. Wyline Copas about patient's glucose >600 and patient being on home dose of U-500. MD ordered BMET stat. Patient to possibly be placed on IV insulin gtt after BMET obtained and transition back on home dose of U-500 and overlap with IV insulin 2 hours. Patient did not receive U-500 dose last night. The insulin dose have certain basal properties within it.  Thanks,  Tama Headings RN, MSN, [facility] Inpatient Diabetes Coordinator Team Pager [phone] (8a-5p)

## 2016-01-30 DIAGNOSIS — D72829 Elevated white blood cell count, unspecified: Secondary | ICD-10-CM | POA: Diagnosis not present

## 2016-01-30 DIAGNOSIS — I10 Essential (primary) hypertension: Secondary | ICD-10-CM | POA: Diagnosis not present

## 2016-01-30 DIAGNOSIS — D45 Polycythemia vera: Secondary | ICD-10-CM | POA: Diagnosis not present

## 2016-01-30 DIAGNOSIS — R0600 Dyspnea, unspecified: Secondary | ICD-10-CM | POA: Diagnosis not present

## 2016-01-30 LAB — BASIC METABOLIC PANEL
Anion gap: 10 (ref 5–15)
BUN: 16 mg/dL (ref 6–20)
CALCIUM: 8.6 mg/dL — AB (ref 8.9–10.3)
CO2: 24 mmol/L (ref 22–32)
CREATININE: 0.98 mg/dL (ref 0.44–1.00)
Chloride: 106 mmol/L (ref 101–111)
GFR calc non Af Amer: 55 mL/min — ABNORMAL LOW (ref >60)
Glucose, Bld: 132 mg/dL — ABNORMAL HIGH (ref 65–99)
Potassium: 3.5 mmol/L (ref 3.5–5.1)
SODIUM: 140 mmol/L (ref 135–145)

## 2016-01-30 LAB — GLUCOSE, CAPILLARY
GLUCOSE-CAPILLARY: 222 mg/dL — AB (ref 65–99)
Glucose-Capillary: 292 mg/dL — ABNORMAL HIGH (ref 65–99)

## 2016-01-30 LAB — CBC
HCT: 35.6 % — ABNORMAL LOW (ref 36.0–46.0)
Hemoglobin: 11.3 g/dL — ABNORMAL LOW (ref 12.0–15.0)
MCH: 28 pg (ref 26.0–34.0)
MCHC: 31.7 g/dL (ref 30.0–36.0)
MCV: 88.3 fL (ref 78.0–100.0)
PLATELETS: 474 10*3/uL — AB (ref 150–400)
RBC: 4.03 MIL/uL (ref 3.87–5.11)
RDW: 19 % — AB (ref 11.5–15.5)
WBC: 18 10*3/uL — ABNORMAL HIGH (ref 4.0–10.5)

## 2016-01-30 MED ORDER — RUXOLITINIB PHOSPHATE 20 MG PO TABS: 20.0000 mg | ORAL_TABLET | Freq: Two times a day (BID) | ORAL | Status: DC

## 2016-01-30 NOTE — Care Management Obs Status (Signed)
[place] NOTIFICATION   Patient Details  Name: Amanda [name] MRN: [id number] Date of Birth: 11-Apr-1941   Medicare Observation Status Notification Given:  Yes CM gave patient letter, pt stated " I already signed that form that says i am Observation."   Royston Bake, RN 01/30/2016, 1:31 PM

## 2016-01-30 NOTE — Progress Notes (Signed)
Pt has orders to be discharged. Discharge instructions given and pt has no additional questions at this time. Medication regimen reviewed and pt educated. Pt verbalized understanding and has no additional questions. Telemetry box removed. IV removed and site in good condition. Pt stable and waiting for transportation.   Sakura Denis RN 

## 2016-01-30 NOTE — Discharge Summary (Signed)
Physician Discharge Summary  Amanda [name] BZJ:[id number] DOB: 08-24-1941 DOA: 01/28/2016  PCP: Beatrice Lecher, MD  Admit date: 01/28/2016 Discharge date: 01/30/2016  Time spent: 20 minutes  Recommendations for Outpatient Follow-up:  1. Follow up with PCP in 2-3 weeks 2. Follow up with Endocrinologist as scheduled   Discharge Diagnoses:  Principal Problem:   Dyspnea Active Problems:   Essential hypertension, benign   [name] (HCC)   Leukocytosis   Type 2 diabetes mellitus with neurological manifestations, uncontrolled ([place])   Coronary artery disease involving native coronary artery   Cirrhosis [facility])   Discharge Condition: Improved  Diet recommendation: Diabetic  Filed Weights   01/28/16 1342 01/29/16 0620 01/30/16 0437  Weight: 101.152 kg (223 lb) 100.789 kg (222 lb 3.2 oz) 100.925 kg (222 lb 8 oz)    History of present illness:  Please review dictated H and P from 4/12 for details. Briefly, 75 y.o. female with a past medical history significant for polycythemia vera, IDDM on U500, CAD s/p DES 01/2015, cirrhosis (from NASH?), and hx of Cdiff who presents with dyspnea over a period of several weeks prior to admission  Hospital Course:  1. Dyspnea:  Uncertain etiology. Reportedly has been worsening over the past 2-3 weeks. Has remote history of asthma but no wheezing on exam.Cardiology consulted. Likely not CHF -Given trial of albuterol, reports feeling improved  -Abdomen US to neg for ascites -Echocardiogram ordered, EF of 60-65% - Pt presented with glucose near the 500 range with metabolic acidosis but no anion gap to suspect florid DKA - suspect resulting metabolic acidosis be contributing etiology  2. IDDM with hyperglycemia:  BG >500 at admission. -Patient was continued on home U500 100 units at breakfast and 85 units at night -Sliding scale added with meals - Blood glucose had been initially difficult to control. Ultimately improved to the 200  range, corresponding to recent A1c of 9.6 - Advise continued adjustment to insulin regimen as outpatient  3. HTN:  -Continue home losartan and amlodipine  4. CAD s/p PCI:  STEMI and DES in April 2016. Echo with EF 50% since then.  -Continue home Brilinta and aspirin -Continue statin and BP meds as tolerated  5. Chronic diastolic CHF: BNP low, CXR without edema.Per cardiology, pt either euvolemic vs mildly hypovolemic  6. 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  -[place] neg for ascites  7. [name]: Not clear that this contributing to presenting complaint  -Continue home Jakafi  8. Chronic leukocytosis: Somewhat higher than usual. No obvious infection on CXR. Had dysuria recently but UA normal and not treated, symptoms resolved, no current symptoms of UTI.   9. CKD: Baseline Cr 1.0, now around 1.2, eGFR 45.  10. Aortic dilation: Prominence of the ascending thoracic aorta with a maximum transverse diameter of 4.1 x 3.4 cm. Recommend annual imaging followup by CTA or MRA -Follow up in 1 year  11. Lung nodule: 7 x 6 mm nodular opacity is noted in superior segment right lower lobe.  -Non-contrast chest CT at 6-12 months is recommended. If the nodule is stable at time of repeat CT, then future CT at 18-24 months (from today's scan) is considered optional for low-risk patients, but is recommended for high-risk patients.  Consultations:  Cardiology  Discharge Exam: Filed Vitals:   01/29/16 2041 01/30/16 0437 01/30/16 0626 01/30/16 1154  BP: 143/61  144/49 154/58  Pulse: 90  82 76  Temp: 98.2 F (36.8 C)  98.3 F (36.8 C) 98.5 F (36.9 C)  TempSrc: Oral  Oral Oral  Resp:    20  Height:      Weight:  100.925 kg (222 lb 8 oz)    SpO2: 96%  96% 99%    General: awake, in nad Cardiovascular: regular, s1, s2 Respiratory: normal resp effort, no wheezing  Discharge Instructions     Medication List    TAKE these medications        amLODipine 5 MG  tablet  Commonly known as:  NORVASC  Take 1 tablet (5 mg total) by mouth daily.     dicyclomine 10 MG capsule  Commonly known as:  BENTYL  Take 1 tab by mouth every morning, may take twice daily as needed.     furosemide 20 MG tablet  Commonly known as:  LASIX  Take 1 tablet (20 mg total) by mouth daily.     insulin regular human CONCENTRATED 500 UNIT/ML kwikpen  Commonly known as:  HUMULIN R U-500 KWIKPEN  Inject 100 units under the skin before breakfast and 85units before dinner.     loperamide 2 MG tablet  Commonly known as:  IMODIUM A-D  Take 2 mg by mouth daily as needed for diarrhea or loose stools.     losartan 100 MG tablet  Commonly known as:  COZAAR  Take 1 tablet (100 mg total) by mouth daily.     nitroGLYCERIN 0.4 MG SL tablet  Commonly known as:  NITROSTAT  Place 1 tablet (0.4 mg total) under the tongue every 5 (five) minutes as needed for chest pain.     NONFORMULARY OR COMPOUNDED ITEM  Neuropathy cream: mix Amitriptyline 2%, Lidocaine 2%, and Ketamine 1% - apply on feet 2x a day [facility].     omega-3 acid ethyl esters 1 g capsule  Commonly known as:  LOVAZA  Take 1 capsule (1 g total) by mouth 2 (two) times daily.     rosuvastatin 20 MG tablet  Commonly known as:  CRESTOR  Take 1 tablet (20 mg total) by mouth daily.     ruxolitinib phosphate 25 MG tablet  Commonly known as:  JAKAFI  Take 1 tablet (25 mg total) by mouth 2 (two) times daily.     ticagrelor 90 MG Tabs tablet  Commonly known as:  BRILINTA  Take 1 tablet (90 mg total) by mouth 2 (two) times daily.       Allergies  Allergen Reactions  . Lisinopril Cough  . Tape Hives  . Doxycycline Hives, Swelling and Rash  . Latex Hives, Itching and Rash   Follow-up Information    Follow up with Blanchie Serve, MD.   Specialty:  Internal Medicine   Contact information:   [place] [address] [phone]       Follow up with Primus Bravo NP, [place].       Follow up with METHENEY,CATHERINE, MD. Schedule an appointment as soon as possible for a visit in 2 weeks.   Specialty:  Family Medicine   Why:  Hospital follow up   Contact information:   [address] [place] [address] [phone]       Schedule an appointment as soon as possible for a visit with [name], MD.   Specialty:  Internal Medicine   Why:  Hospital follow up   Contact information:   [address] [place] [address] [phone]        The results of significant diagnostics from this hospitalization (including imaging,  microbiology, ancillary and laboratory) are listed below for reference.    Significant Diagnostic Studies: Dg Chest 2 View  01/28/2016  CLINICAL DATA:  Shortness of breath and asthma the past 3 days. Cough. Former smoker. History of hypertension and diabetes. EXAM: CHEST  2 VIEW COMPARISON:  01/26/2016; 01/31/2015; 01/29/2014; 05/31/2013 FINDINGS: Grossly unchanged cardiac silhouette and mediastinal contours with atherosclerotic plaque within the thoracic aorta. Lung volumes remain reduced with grossly unchanged bibasilar heterogeneous opacities favored to represent atelectasis or scar. There is unchanged mild diffuse slightly nodular thickening of the pulmonary interstitium. No discrete focal airspace opacities. No pleural effusion or pneumothorax. There is persistent minimal pleural parenchymal thickening about the bilateral major and the right minor fissures. No evidence of edema. No acute osseous abnormalities. IMPRESSION: Similar findings of chronic bronchitic change and hypoventilation without definitive superimposed acute cardiopulmonary disease. Electronically Signed   By: Sandi Mariscal M.D.   On: 01/28/2016 14:26   Dg Chest 2 View  01/26/2016  CLINICAL DATA:  Shortness of breath. EXAM: CHEST  2 VIEW COMPARISON:  01/31/2015. FINDINGS: Cardiomegaly with mild bilateral from interstitial prominence. Mild  congestive heart failure cannot be excluded. No pleural effusion or pneumothorax . No acute bony abnormality . IMPRESSION: Cardiomegaly with mild bilateral pulmonary interstitial prominence suggesting mild congestive heart failure. Electronically Signed   By: Marcello Moores  Register   On: 01/26/2016 10:26   Ct Angio Chest Pe W/cm &/or Wo Cm  01/28/2016  CLINICAL DATA:  Shortness of breath with 1 week history of left calf region pain EXAM: CT ANGIOGRAPHY CHEST WITH CONTRAST TECHNIQUE: Multidetector CT imaging of the chest was performed using the standard protocol during bolus administration of intravenous contrast. Multiplanar CT image reconstructions and MIPs were obtained to evaluate the vascular anatomy. CONTRAST:  65 mL Isovue 370 nonionic COMPARISON:  Chest radiograph January 28, 2016 FINDINGS: Mediastinum/Lymph Nodes: There is no demonstrable pulmonary embolus. There is prominence of the ascending thoracic aorta with a maximum transverse diameter of 4.1 x 3.4 cm. No dissection evident. There are scattered foci of atherosclerotic calcification in aorta. The visualized great vessels appear unremarkable. There is left ventricular hypertrophy. There are foci of coronary artery calcification bilaterally. There is also mild pericardial calcification along the posterior lateral aspects of the pericardium. There is a minimal pericardial effusion. Thyroid appears unremarkable. There are scattered subcentimeter mediastinal lymph nodes but no adenopathy by size criteria. Lungs/Pleura: There is mild bibasilar atelectasis. There is no parenchymal lung edema or consolidation. On axial slice 44 series 5, there is a nodular opacity measuring 7 x 6 mm in the superior segment of the right lower lobe. Upper abdomen: In the visualized upper abdomen, the liver has a nodular contour with prominence of the caudate lobe, findings indicative of hepatic cirrhosis. Spleen is incompletely visualized but is noted to be enlarged. There is a  calculus in the mid right kidney measuring 8 x 3 mm. There is atherosclerotic calcification in the aorta. Visualized upper abdominal structures otherwise appear unremarkable. Musculoskeletal: There are no blastic or lytic bone lesions. There is degenerative change in thoracic spine. Review of the MIP images confirms the above findings. IMPRESSION: No demonstrable pulmonary embolus. Prominence of the ascending thoracic aorta with a maximum transverse diameter of 4.1 x 3.4 cm. Recommend annual imaging followup by CTA or MRA. This recommendation follows 2010 ACCF/AHA/AATS/ACR/ASA/SCA/SCAI/SIR/STS/SVM Guidelines for the Diagnosis and Management of Patients with Thoracic Aortic Disease. Circulation. 2010; 121: T517-O160 No parenchymal lung edema or consolidation. 7 x 6 mm nodular opacity is noted  in superior segment right lower lobe. Non-contrast chest CT at 6-12 months is recommended. If the nodule is stable at time of repeat CT, then future CT at 18-24 months (from today's scan) is considered optional for low-risk patients, but is recommended for high-risk patients. This recommendation follows the consensus statement: Guidelines for Management of Incidental Pulmonary Nodules Detected on CT Images:From the Fleischner Society 2017; published online before print (10.1148/radiol.2637858850). No demonstrable adenopathy. Evidence of hepatic cirrhosis with splenomegaly. Scattered foci of atherosclerotic calcification in the aorta. There are scattered foci of coronary artery calcification. There is a minimal pericardial effusion with areas of pericardial calcification posteriorly and laterally. Electronically Signed   By: Lowella Grip III M.D.   On: 01/28/2016 20:28   US Abdomen Limited  01/29/2016  CLINICAL DATA:  Assess for ascites. Current history of hepatic cirrhosis. Initial encounter. EXAM: LIMITED ABDOMEN ULTRASOUND FOR ASCITES TECHNIQUE: Limited ultrasound survey for ascites was performed in all four abdominal  quadrants. COMPARISON:  Abdominal ultrasound performed 09/23/2015 FINDINGS: There is no evidence of ascites within the 4 quadrants of the abdomen. IMPRESSION: No evidence of ascites. Electronically Signed   By: Garald Balding M.D.   On: 01/29/2016 03:15    Microbiology: Recent Results (from the past 240 hour(s))  Urine Culture     Status: None   Collection Time: 01/26/16 10:05 AM  Result Value Ref Range Status   Colony Count NO GROWTH  Final   Organism ID, Bacteria NO GROWTH  Final  MRSA PCR Screening     Status: Abnormal   Collection Time: 01/29/16  1:00 AM  Result Value Ref Range Status   MRSA by PCR POSITIVE (A) NEGATIVE Final    Comment:        The GeneXpert MRSA Assay (FDA approved for NASAL specimens only), is one component of a comprehensive MRSA colonization surveillance program. It is not intended to diagnose MRSA infection nor to guide or monitor treatment for MRSA infections. RESULT CALLED TO, READ BACK BY AND VERIFIED WITH: W ASIDR,RN _0  01/29/16 MKELLY      Labs: Basic Metabolic Panel:  Recent Labs Lab 01/26/16 1340 01/28/16 1353 01/29/16 0059 01/29/16 1346 01/30/16 0426  NA 131* 134*  --  133* 140  K 4.5 4.1  --  5.1 3.5  CL 98 101  --  101 106  CO2 23 19*  --  17* 24  GLUCOSE 477* 521*  --  585* 132*  BUN 16 14  --  17 16  CREATININE 1.27* 1.18* 1.04* 1.14* 0.98  CALCIUM 8.8 8.7*  --  8.7* 8.6*   Liver Function Tests:  Recent Labs Lab 01/26/16 1340  AST 32  ALT 22  ALKPHOS 106  BILITOT 0.7  PROT 6.5  ALBUMIN 4.1   No results for input(s): LIPASE, AMYLASE in the last 168 hours. No results for input(s): AMMONIA in the last 168 hours. CBC:  Recent Labs Lab 01/26/16 1340 01/28/16 1353 01/29/16 0059 01/30/16 0426  WBC 19.0* 19.5* 20.2* 18.0*  NEUTROABS 15960*  --   --   --   HGB 11.9 12.4 12.1 11.3*  HCT 39.0 38.4 37.6 35.6*  MCV 93.5 88.5 87.0 88.3  PLT 543* 523* 573* 474*   Cardiac Enzymes:  Recent Labs Lab 01/29/16 0059  01/29/16 1346  TROPONINI 0.03 0.04*   BNP: BNP (last 3 results)  Recent Labs  01/28/16 1647  BNP 77.9    ProBNP (last 3 results) No results for input(s): PROBNP in the last 8760 hours.  CBG:  Recent Labs Lab 01/29/16 1300 01/29/16 1623 01/29/16 2150 01/30/16 0646 01/30/16 1152  GLUCAP >600* 288* 133* 222* 292*    Signed:  CHIU, STEPHEN K  [facility] 01/30/2016, 4:38 PM

## 2016-02-05 ENCOUNTER — Ambulatory Visit (INDEPENDENT_AMBULATORY_CARE_PROVIDER_SITE_OTHER): Payer: Medicare Other | Admitting: Family Medicine

## 2016-02-05 ENCOUNTER — Encounter: Payer: Self-pay | Admitting: Family Medicine

## 2016-02-05 VITALS — BP 130/72 | HR 89 | Ht 64.5 in | Wt 226.0 lb

## 2016-02-05 DIAGNOSIS — R911 Solitary pulmonary nodule: Secondary | ICD-10-CM | POA: Insufficient documentation

## 2016-02-05 DIAGNOSIS — I10 Essential (primary) hypertension: Secondary | ICD-10-CM | POA: Diagnosis not present

## 2016-02-05 DIAGNOSIS — R0600 Dyspnea, unspecified: Secondary | ICD-10-CM

## 2016-02-05 DIAGNOSIS — I77819 Aortic ectasia, unspecified site: Secondary | ICD-10-CM | POA: Diagnosis not present

## 2016-02-05 DIAGNOSIS — D72829 Elevated white blood cell count, unspecified: Secondary | ICD-10-CM

## 2016-02-05 DIAGNOSIS — K7460 Unspecified cirrhosis of liver: Secondary | ICD-10-CM | POA: Diagnosis not present

## 2016-02-05 DIAGNOSIS — I808 Phlebitis and thrombophlebitis of other sites: Secondary | ICD-10-CM

## 2016-02-05 LAB — COMPLETE METABOLIC PANEL WITH GFR
ALBUMIN: 4.1 g/dL (ref 3.6–5.1)
ALK PHOS: 94 U/L (ref 33–130)
ALT: 24 U/L (ref 6–29)
AST: 41 U/L — ABNORMAL HIGH (ref 10–35)
BILIRUBIN TOTAL: 0.8 mg/dL (ref 0.2–1.2)
BUN: 17 mg/dL (ref 7–25)
CALCIUM: 9.9 mg/dL (ref 8.6–10.4)
CO2: 23 mmol/L (ref 20–31)
Chloride: 104 mmol/L (ref 98–110)
Creat: 1.06 mg/dL — ABNORMAL HIGH (ref 0.60–0.93)
GFR, EST NON AFRICAN AMERICAN: 51 mL/min — AB (ref >=60)
GFR, Est African American: 59 mL/min — ABNORMAL LOW (ref >=60)
GLUCOSE: 144 mg/dL — AB (ref 65–99)
POTASSIUM: 4.8 mmol/L (ref 3.5–5.3)
Sodium: 138 mmol/L (ref 135–146)
TOTAL PROTEIN: 6.8 g/dL (ref 6.1–8.1)

## 2016-02-05 LAB — CBC WITH DIFFERENTIAL/PLATELET
BASOS ABS: 304 {cells}/uL — AB (ref 0–200)
Basophils Relative: 1 %
EOS PCT: 1 %
Eosinophils Absolute: 304 cells/uL (ref 15–500)
HCT: 41.8 % (ref 35.0–45.0)
HEMOGLOBIN: 13.7 g/dL (ref 11.7–15.5)
LYMPHS ABS: 6992 {cells}/uL — AB (ref 850–3900)
Lymphocytes Relative: 23 %
MCH: 29.3 pg (ref 27.0–33.0)
MCHC: 32.8 g/dL (ref 32.0–36.0)
MCV: 89.3 fL (ref 80.0–100.0)
MPV: 9.4 fL (ref 7.5–12.5)
Monocytes Absolute: 1216 cells/uL — ABNORMAL HIGH (ref 200–950)
Monocytes Relative: 4 %
NEUTROS ABS: 21584 {cells}/uL — AB (ref 1500–7800)
NEUTROS PCT: 71 %
Platelets: 859 10*3/uL — ABNORMAL HIGH (ref 140–400)
RBC: 4.68 MIL/uL (ref 3.80–5.10)
RDW: 19.8 % — ABNORMAL HIGH (ref 11.0–15.0)
WBC: 30.4 10*3/uL — ABNORMAL HIGH (ref 3.8–10.8)

## 2016-02-05 NOTE — Progress Notes (Signed)
Subjective:    Patient ID: Amanda [name], female    DOB: 04-Aug-1941, 75 y.o.   MRN: [id number]  HPI  Patient comes in today for hospital follow-up. She was seen in our office on April 12 for dyspnea. She looked quite ill during her office visit. We sent her to the emergency department via private vehicle with her husband. They admitted her as she did have a history significant for polycythemia vera, insulin-dependent diabetes and coronary artery disease as well as cirrhosis. She had been extremely short of breath over a couple of weeks. They did an abdominal ultrasound to rule out ascites. They did an echocardiogram which showed a normal ejection fraction of 60-65%. She did have an elevated glucose but no anion gap to diagnosis DKA. She also admits that she had been lying on her back during most of the day and taking long naps during the day and watching TV. She does have a history of asthma but has not been an active problem for almost 20 years. They were able to get her blood glucose down into the normal range before discharge home. Her blood pressure remained stable. She is actually feeling much better today overall. He does want me to look at her right posterior forearm where she had an IV placed. She says it's been extremely tender to touch since she's been home.  Review of Systems  BP 130/72 mmHg  Pulse 89  Ht 5' 4.5" (1.638 m)  Wt 226 lb (102.513 kg)  BMI 38.21 kg/m2  SpO2 95%    Allergies  Allergen Reactions  . Lisinopril Cough  . Tape Hives  . Doxycycline Hives, Swelling and Rash  . Latex Hives, Itching and Rash    Past Medical History  Diagnosis Date  . Hypertension   . Hyperlipidemia   . Diabetes mellitus 2008  . Depression   . Obesity   . Psoriasis   . Polycythemia     Dr. Elease Hashimoto- [facility]  . Herpes simplex   . Gout   . Hyperglycemia 05/31/2013  . C. difficile colitis   . Cirrhosis ([place])   . Cancer ([place])     SKIN  . Neuromuscular disorder ([place])     BELL  PALSY  . Candidiasis of skin 09/30/2014  . Myocardial infarction ([place])   . Allergic rhinitis 01/23/2016    Past Surgical History  Procedure Laterality Date  . Breast surgery Left     milk duct  . Total abdominal hysterectomy w/ bilateral salpingoophorectomy      for heavy periods with appendectomy  . Tonsillectomy    . Colonoscopy    . Left heart catheterization with coronary angiogram N/A 01/27/2015    Procedure: LEFT HEART CATHETERIZATION WITH CORONARY ANGIOGRAM;  Surgeon: [name], MD;  Location: [facility] CATH LAB;  Service: Cardiovascular;  Laterality: N/A;    Social History   Social History  . Marital Status: Married    Spouse Name: N/A  . Number of Children: 2  . Years of Education: 2 yr colle   Occupational History  . housewife    Social History Main Topics  . Smoking status: Former Smoker -- 48 years    Types: Cigarettes    Quit date: 10/18/1986  . Smokeless tobacco: Never Used  . Alcohol Use: No  . Drug Use: No  . Sexual Activity:    Partners: Male    Birth Control/ Protection: None   Other Topics Concern  . Not on file   Social  History Narrative   2 caffeine drink per day.  No regular exercise.      Family History  Problem Relation Age of Onset  . Diabetes Mother   . Heart disease Mother     CAD  . Hyperlipidemia Mother   . Hypertension Mother   . Kidney disease Mother   . Kidney disease Father   . Breast cancer Maternal Aunt   . Heart disease Maternal Grandmother   . Heart disease Other     maternal aunts and uncles    Outpatient Encounter Prescriptions as of 02/05/2016  Medication Sig  . hydrochlorothiazide (HYDRODIURIL) 25 MG tablet   . ONETOUCH VERIO test strip   . amLODipine (NORVASC) 5 MG tablet Take 1 tablet (5 mg total) by mouth daily.  Marland Kitchen dicyclomine (BENTYL) 10 MG capsule Take 1 tab by mouth every morning, may take twice daily as needed.  . furosemide (LASIX) 20 MG tablet Take 1 tablet (20 mg total) by mouth daily.  . insulin regular  human CONCENTRATED (HUMULIN R U-500 KWIKPEN) 500 UNIT/ML kwikpen Inject 100 units under the skin before breakfast and 85units before dinner. (Patient taking differently: Inject 100 units under the skin before breakfast and 80 units before dinner.)  . loperamide (IMODIUM A-D) 2 MG tablet Take 2 mg by mouth daily as needed for diarrhea or loose stools.   Marland Kitchen losartan (COZAAR) 100 MG tablet Take 1 tablet (100 mg total) by mouth daily.  . nitroGLYCERIN (NITROSTAT) 0.4 MG SL tablet Place 1 tablet (0.4 mg total) under the tongue every 5 (five) minutes as needed for chest pain.  . NONFORMULARY OR COMPOUNDED ITEM Neuropathy cream: mix Amitriptyline 2%, Lidocaine 2%, and Ketamine 1% - apply on feet 2x a day [facility].  Marland Kitchen omega-3 acid ethyl esters (LOVAZA) 1 G capsule Take 1 capsule (1 g total) by mouth 2 (two) times daily.  . rosuvastatin (CRESTOR) 20 MG tablet Take 1 tablet (20 mg total) by mouth daily.  . ruxolitinib phosphate (JAKAFI) 25 MG tablet Take 1 tablet (25 mg total) by mouth 2 (two) times daily. (Patient taking differently: Take 20 mg by mouth 2 (two) times daily. )  . ticagrelor (BRILINTA) 90 MG TABS tablet Take 1 tablet (90 mg total) by mouth 2 (two) times daily.   No facility-administered encounter medications on file as of 02/05/2016.          Objective:   Physical Exam  Constitutional: She is oriented to person, place, and time. She appears well-developed and well-nourished.  HENT:  Head: Normocephalic and atraumatic.  Cardiovascular: Normal rate, regular rhythm and normal heart sounds.   Pulmonary/Chest: Effort normal and breath sounds normal.  Neurological: She is alert and oriented to person, place, and time.  Skin: Skin is warm and dry.  Psychiatric: She has a normal mood and affect. Her behavior is normal.          Assessment & Plan:  Dyspnea-prior history of asthma. Will give her an albuterol inhaler to use as needed since per the discharge report this was  helpful during admission that the patient herself says she does not remember actually receiving albuterol. Next  Insulin-dependent diabetes, uncontrolled. Her blood sugars are now back into the appropriate range at home that she has had a couple spikes in the 400s. Encouraged her to keep a log of these is clearly this is being triggered by some type of dietary indiscretion it may be something that she's not aware of is actually causing a  bump in her glucose. Keep appointment with Dr. Letta Median next week  Cirrhosis, cryptogenic, likely Karlene Lineman. Ultrasound was negative for ascites.  CKD-due to recheck kidney function today now that she is back off the diuretic.  Aortic dilatation-she did have prominence of the ascending thoracic aorta noted on scan a proximally 4.1 x 3.4 cm and they recommend a follow-up with CTA or MRA in one year.   Lung nodule-7 x 6 mm in the superior segment of the right lower lobe. They recommend CT without contrast at 6-12 months. Next  Hypertension-well controlled. Continue current regimen of losartan and amlodipine.  Phlebitis of right posterior arm where she had her IV. She is Artie on a blood thinner side do not want her to take an NSAID but will treat with compression. If not better over the next week and please let me know also encouraged her to call if she notices any redness over the area.

## 2016-02-06 LAB — PATHOLOGIST SMEAR REVIEW

## 2016-02-06 LAB — TECH ORDERED PATH REVIEW

## 2016-02-09 ENCOUNTER — Ambulatory Visit: Payer: Medicare Other | Admitting: Family Medicine

## 2016-02-09 NOTE — Addendum Note (Signed)
Addended by: Beatrice Lecher D on: 02/09/2016 08:09 AM   Modules accepted: Orders

## 2016-02-11 ENCOUNTER — Encounter: Payer: Self-pay | Admitting: Internal Medicine

## 2016-02-11 ENCOUNTER — Ambulatory Visit (INDEPENDENT_AMBULATORY_CARE_PROVIDER_SITE_OTHER): Payer: Medicare Other | Admitting: Internal Medicine

## 2016-02-11 VITALS — BP 132/68 | HR 78 | Temp 97.5°F | Resp 12 | Wt 226.0 lb

## 2016-02-11 DIAGNOSIS — E1140 Type 2 diabetes mellitus with diabetic neuropathy, unspecified: Secondary | ICD-10-CM | POA: Diagnosis not present

## 2016-02-11 DIAGNOSIS — E1165 Type 2 diabetes mellitus with hyperglycemia: Secondary | ICD-10-CM | POA: Diagnosis not present

## 2016-02-11 MED ORDER — CANAGLIFLOZIN 100 MG PO TABS: 100.0000 mg | ORAL_TABLET | Freq: Every day | ORAL | Status: DC

## 2016-02-11 MED ORDER — INSULIN REGULAR HUMAN (CONC) 500 UNIT/ML ~~LOC~~ SOPN: PEN_INJECTOR | SUBCUTANEOUS | Status: DC

## 2016-02-11 NOTE — Progress Notes (Signed)
Patient ID: Amanda [name], female   DOB: 09/30/1941, 75 y.o.   MRN: [id number]  HPI: Amanda [name] is a 75 y.o.-year-old femaler-old female, returning for f/u for DM2, dx 2008, insulin-dependent since dx, uncontrolled, with complications (DR, PN, CAD). Last visit 3 mo ago.  She was in the hospitalized for dyspnea - 01/28/2016. Sugars in the hospital high, 700s.   She was started on Jakafi before last visit >> sugars better.   Last hemoglobin A1c was: Lab Results  Component Value Date   HGBA1C 9.6 10/28/2015   HGBA1C 8.8* 01/27/2015   HGBA1C 9.8* 05/16/2014  07/01/2015: HbA1c from fructosamine 6.57%. 01/27/2015: HbA1c from fructosamine: 6.57% 05/16/2014: HbA1c from fructosamine: 7.95%. - in 10/16/2012, hemoglobin A1c was 6.9% - in 07/2012, hemoglobin A1c was 9.9% - in 11/16/2010, hemoglobin A1c was 12.8% - in 03/2011, hemoglobin A1c was 6.9% - In 07/10/2010 her hemoglobin A1c was 10.5% C-peptide was 6.3 and 01/2011.  Pt was on a regimen of: - Levemir 45 units 2x a day, in am and at bedtime - Humalog mealtime: 40 units with a smaller meal 45 units with a larger meal  - Sliding Scale of Humalog:  - 150- 165: + 1 unit  - 166- 180: + 2 units  - 181- 195: + 3 units  - 196- 210: + 4 units  - 211- 225: + 5 units  - 226- 240: + 6 units  - >240: + 7 units  She could not tolerate doses of metformin XR higher than 250 mg twice a day in the past due to diarrhea. She does not want to resume this. Was previously on the Victoza 1.8 mg daily - "made me sick". She was previously on Janumet between 2012-2013. She was on Invokana - but could not afford it while in the Medicare doughnut hole.  We switched to U500: - 15 units - on the syringe (0.15 mL) before breakfast >> 20 units >> pens (85 units) - 18 units - on the syringe (0.18 mL) before dinner >> 24 units >> pens (100 units)  She checks sugars 2-3x a day (when skips insulin >> 400s, OTW up to 200s): - am: 190-255 >> 209-347 >> same >> 90-211, 235  >> 96, 128-206, 291 >> 170-200 >> 118-227, 276, 478  - 2h after b'fast: n/c >> 238 >> n/c - before lunch: 140, 223, 247 >> 161-319 >> 258-290 >> 167-343 >> 175-340 >> 238 >> n/c >> 128-235, 418, 499 - 2h after lunch: 240-312 >> n/c >> 270-456 >> 112-218 >> n/c >> 108-447 - before dinner: 175-336 >> 147, 273 >> 58, 66, 89-157, 203, 224 (318 - Holiday) >> 150-160 >> 162-386, 592 - bedtime: 152-306 >> n/c >> 161-326 >> 164, 240, 240, 268 >> 273 >> n/c No lows. Lowest sugar was 90x1 >> 70x1 >> 70-80s at 4 pm >> 58 >> 100  >> 108; she has hypoglycemia awareness at 100. Highest 300x1 >> 592 (after cake)..  Pt's meals are: - Breakfast: ("I am not a breakfast eater") 1 egg + toast; 3 pancakes + sugarfree syrup; waffle; french toast - not skipping anymore  - Lunch: sometimes skips, sometimes eats out: pizza, salad, no dessert - Dinner: at 5:30-6pm: soup, meat + vegetables + starch, dessert - Snacks: 8 pm: fruit, sugar free  She was seen for nutritional advice and diabetes education, with last visit on 04/2012 - [name].  - no CKD, last BUN/creatinine:  Lab Results  Component Value Date   BUN 17 02/05/2016  CREATININE 1.06* 02/05/2016  She is on ACEI.  - last set of lipids - per PCP - no records available. Last Lipid panel in Epic: Lab Results  Component Value Date   CHOL 168 01/27/2015   HDL 27* 01/27/2015   LDLCALC 88 01/27/2015   TRIG 266* 01/27/2015   CHOLHDL 6.2 01/27/2015  She is on Pravastatin. - last eye exam was in 07/2013. 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 At last eye exam (Dr Zigmund Daniel): cataracts. - no numbness and tingling in her feet. For her peripheral neuropathy >> saw PCP, podiatrist, orthopedic >> tried capsaicin cream, compression stockings, then Neurontin >> on 600 mg 3x a day. She is on aspirin 81 mg daily.  She also has a history of hypertension, diastolic dysfunction, hyperlipidemia, polycytemia vera, gout, psoriasis, depression, history of Bell's  palsy, obesity, vitamin D deficiency.  On 01/27/2015 she had an AMI >> had 2 stents placed. She had very erratic sugars in the hospital.  She recently changed the PVera tx. Starts Jakafi.  ROS: Constitutional: + weight gain, no fatigue Eyes: no blurry vision, no xerophthalmia ENT: no sore throat, no nodules palpated in throat, no dysphagia/odynophagia, no hoarseness Cardiovascular: no CP/palpitations/+ leg swelling Respiratory:no cough/+ SOB Gastrointestinal: no N/V/+ D/+ C Musculoskeletal: no muscle/joint aches Skin: No rashes Neurological: no tremors/numbness/tingling/dizziness  I reviewed pt's medications, allergies, PMH, social hx, family hx, and changes were documented in the history of present illness. Otherwise, unchanged from my initial visit note.  PE: BP 132/68 mmHg  Pulse 78  Temp(Src) 97.5 F (36.4 C) (Oral)  Resp 12  Wt 226 lb (102.513 kg)  SpO2 94% Body mass index is 38.21 kg/(m^2). Wt Readings from Last 3 Encounters:  02/11/16 226 lb (102.513 kg)  02/05/16 226 lb (102.513 kg)  01/30/16 222 lb 8 oz (100.925 kg)   Constitutional: overweight, in NAD Eyes: PERRLA, EOMI, no exophthalmos ENT: moist mucous membranes, no thyromegaly, no cervical lymphadenopathy Cardiovascular: RRR, No MRG, no leg swelling Respiratory: CTA B Gastrointestinal: abdomen soft, NT, ND, BS+ Musculoskeletal: no deformities, strength intact in all 4 Skin: moist, warm  ASSESSMENT: 1. 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 Diabetic peripheral neuropathy  PLAN:  1. Patient with long-standing, uncontrolled diabetes, on U500 insulin, with high CBGs, especially if misses the U500 dose. Strongly advised not to miss any doses. Will also add Invokana. we discussed about SEs of Invokana, which are: dizziness (advised to be careful when stands from sitting position), decreased BP - usually not < normal (BP today is not low), and  fungal UTIs (advised to let me know if develops one).  - given discount card for Invokana - Last fructosamine reviewed >> calculated HbA1c 6.57%, stable. - I advised her to:  Patient Instructions  Please continue U500 85 units before b'fast and 100 units before dinner. Please add Invokana 100 mg before b'fast.  Please return in 1.5 months with your sugar log.   Please stop at the lab.  - check 3 times a day, rotating checks - given more sugar logs. - needs a new eye exam (Dr. Zigmund Daniel)  >> again advised to schedule!!! - will check a  Fructosamine - The HbA1c is unreliable and should not be checked. - had flu vaccine this season - Return to clinic in 3 months with sugar log  2. Diabetic peripheral neuropathy - on Neurontin to 600 mg 3 times a day - on neuropathy cream: mix Amitriptyline 2%, Lidocaine  2%, and Ketamine 1% - apply on feet 2x a day - better after her stents  Office Visit on 02/11/2016  Component Date Value Ref Range Status  . Fructosamine 02/11/2016 344* 0 - 285 umol/L Final   Comment: Published reference interval for apparently healthy subjects between age 39 and 4 is 81 - 285 umol/L and in a poorly controlled diabetic population is 228 - 563 umol/L with a mean of 396 umol/L.    HbA1c Calculated from the fructosamine is 7.5%

## 2016-02-11 NOTE — Patient Instructions (Addendum)
Please continue U500 85 units before b'fast and 100 units before dinner. Please add Invokana 100 mg before b'fast.  Please return in 1.5 months with your sugar log.   Please stop at the lab.

## 2016-02-12 ENCOUNTER — Telehealth: Payer: Self-pay | Admitting: Internal Medicine

## 2016-02-12 LAB — FRUCTOSAMINE: Fructosamine: 344 umol/L — ABNORMAL HIGH (ref 0–285)

## 2016-02-12 MED ORDER — CANAGLIFLOZIN 100 MG PO TABS: 100.0000 mg | ORAL_TABLET | Freq: Every day | ORAL | Status: DC

## 2016-02-12 NOTE — Telephone Encounter (Signed)
Please read message below. Is pt now on Victoza?

## 2016-02-12 NOTE — Telephone Encounter (Signed)
PT needs victoza called into [facility] on [address]

## 2016-02-12 NOTE — Telephone Encounter (Signed)
I think she means Invokana 100.

## 2016-02-13 ENCOUNTER — Ambulatory Visit: Payer: Medicare Other | Admitting: Family Medicine

## 2016-02-24 ENCOUNTER — Telehealth: Payer: Self-pay | Admitting: Cardiovascular Disease

## 2016-02-24 DIAGNOSIS — I2121 ST elevation (STEMI) myocardial infarction involving left circumflex coronary artery: Secondary | ICD-10-CM

## 2016-02-24 NOTE — Telephone Encounter (Signed)
New message     Pt has been taking brilinta for a year.  Will she still need to continue taking it?  If yes,  Patient calling the office for samples of medication:   1.  What medication and dosage are you requesting samples for?  brilinta 13m  2.  Are you currently out of this medication? yes

## 2016-02-24 NOTE — Telephone Encounter (Signed)
Calling for Brilinta samples. Also routed to Dr. [name] to advise if she needs to continue this medication after 1 yr continuous use.

## 2016-02-25 NOTE — Telephone Encounter (Signed)
Follow Up  pt returned the call

## 2016-02-25 NOTE — Telephone Encounter (Signed)
Patient has been out of Brilinta for 2 days.  Unsure if patient will need to continue taking Brilinta - have not heard back from MD yet  Medication samples have been provided to the patient.  Drug name: brilinta 79m  Qty: 16 tabs (2 bottles) = 8 days  LOT: [id number] Exp.Date: 10.2019  Husband will pick up samples tmr AM

## 2016-02-25 NOTE — Telephone Encounter (Signed)
Follow up    Pt husband haven't received call back about pt medication

## 2016-02-25 NOTE — Telephone Encounter (Signed)
LMTCB

## 2016-02-25 NOTE — Telephone Encounter (Signed)
Message routed to Dr. Arnette Norris to confirm if patient should continue taking Brilinta - >1 year post stent

## 2016-02-25 NOTE — Telephone Encounter (Signed)
F/u  Pt returningphone call. Please call back and discuss.

## 2016-02-27 ENCOUNTER — Telehealth: Payer: Self-pay | Admitting: Internal Medicine

## 2016-02-27 NOTE — Telephone Encounter (Signed)
Returned call. He gave blood sugar readings. Will give Dr Cruzita Lederer readings (written down). Please advise.

## 2016-02-27 NOTE — Telephone Encounter (Signed)
Called pt and advised her per Dr Gherghe's message. Pt voiced understanding.  

## 2016-02-27 NOTE — Telephone Encounter (Signed)
Reviewed her logs. Sugars were higher when she was sick and presumably when she forgot her insulin. Otherwise, I believe they did improve on iNVOKANA. Please continue the current regimen for now.

## 2016-02-27 NOTE — Telephone Encounter (Signed)
PT husband called requesting a call back from you

## 2016-03-01 NOTE — Telephone Encounter (Signed)
LMTCB

## 2016-03-01 NOTE — Telephone Encounter (Signed)
Pt is just 1 yr s/p STEMI; would continue anti-platelet therapy, but can reduce brilinta to 60 mg bid per Pegasys trial data, or if cannot afford, then change to plavix 75 mg and check  P2Y12 blood test in 1 week in plavix is used.

## 2016-03-02 ENCOUNTER — Telehealth: Payer: Self-pay | Admitting: Internal Medicine

## 2016-03-02 ENCOUNTER — Telehealth: Payer: Self-pay | Admitting: Pharmacist Clinician (PhC)/ Clinical Pharmacy Specialist

## 2016-03-02 MED ORDER — CLOPIDOGREL BISULFATE 75 MG PO TABS: 75.0000 mg | ORAL_TABLET | Freq: Every day | ORAL | Status: DC

## 2016-03-02 MED ORDER — TICAGRELOR 60 MG PO TABS
60.0000 mg | ORAL_TABLET | Freq: Two times a day (BID) | ORAL | Status: DC
Start: 2016-03-02 — End: 2017-02-28

## 2016-03-02 NOTE — Telephone Encounter (Signed)
Spoke with pt husband, aware of dr [facility] recommendations. They can not afford the brilinta, they would like to switch to plavix. New script sent to the pharmacy Lab orders mailed to the pt .

## 2016-03-02 NOTE — Telephone Encounter (Signed)
PT husband wants to know if the forms for her have been faxed to [facility] yet.

## 2016-03-02 NOTE — Telephone Encounter (Signed)
Advised pt's husband that it was faxed today.

## 2016-03-02 NOTE — Telephone Encounter (Signed)
Husband brought in paperwork for [place] [facility] for patient to take [place].  Was previously on Brilinta 90 mg bid, Dr [name] suggested either decrease to 60 mg bid or switch to clopidogrel.  They would prefer to stay with Brilinta.  Gave 2 weeks of samples and will have Dr. [name] sign paperwork tomorrow to send in.

## 2016-03-09 ENCOUNTER — Ambulatory Visit (HOSPITAL_BASED_OUTPATIENT_CLINIC_OR_DEPARTMENT_OTHER): Payer: Medicare Other | Admitting: Hematology & Oncology

## 2016-03-09 ENCOUNTER — Ambulatory Visit (HOSPITAL_BASED_OUTPATIENT_CLINIC_OR_DEPARTMENT_OTHER): Payer: Medicare Other

## 2016-03-09 ENCOUNTER — Encounter: Payer: Self-pay | Admitting: Hematology & Oncology

## 2016-03-09 ENCOUNTER — Ambulatory Visit: Payer: Medicare Other

## 2016-03-09 VITALS — BP 154/68 | HR 101 | Temp 97.4°F | Resp 16 | Ht 64.0 in | Wt 229.0 lb

## 2016-03-09 DIAGNOSIS — D72829 Elevated white blood cell count, unspecified: Secondary | ICD-10-CM

## 2016-03-09 DIAGNOSIS — C946 Myelodysplastic disease, not classified: Secondary | ICD-10-CM

## 2016-03-09 DIAGNOSIS — D45 Polycythemia vera: Secondary | ICD-10-CM

## 2016-03-09 DIAGNOSIS — D649 Anemia, unspecified: Secondary | ICD-10-CM | POA: Diagnosis not present

## 2016-03-09 LAB — MANUAL DIFFERENTIAL (CHCC SATELLITE)
ALC: 4 10*3/uL — AB (ref 0.6–2.2)
ANC (CHCC HP manual diff): 16.6 10*3/uL — ABNORMAL HIGH (ref 1.5–6.7)
BASO: 1 % (ref 0–2)
Band Neutrophils: 2 % (ref 0–10)
Blasts: 1 % — ABNORMAL HIGH (ref 0–0)
Eos: 3 % (ref 0–7)
LYMPH: 17 % (ref 14–48)
METAMYELOCYTES PCT: 2 % — AB (ref 0–0)
MONO: 7 % (ref 0–13)
Myelocytes: 2 % — ABNORMAL HIGH (ref 0–0)
PLT EST ~~LOC~~: INCREASED
SEG: 65 % (ref 40–75)
nRBC: 2 % — ABNORMAL HIGH (ref 0–0)

## 2016-03-09 LAB — CBC WITH DIFFERENTIAL (CANCER CENTER ONLY)
HEMATOCRIT: 31.8 % — AB (ref 34.8–46.6)
HEMOGLOBIN: 10.4 g/dL — AB (ref 11.6–15.9)
MCH: 30.3 pg (ref 26.0–34.0)
MCHC: 32.7 g/dL (ref 32.0–36.0)
MCV: 93 fL (ref 81–101)
PLATELETS: 450 10*3/uL — AB (ref 145–400)
RBC: 3.43 10*6/uL — ABNORMAL LOW (ref 3.70–5.32)
RDW: 18 % — AB (ref 11.1–15.7)
WBC: 23.4 10*3/uL — AB (ref 3.9–10.0)

## 2016-03-09 LAB — CHCC SATELLITE - SMEAR

## 2016-03-09 NOTE — Progress Notes (Signed)
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 Other    New Patient  : I just feel tired.  HPI: Amanda [name] is a very nice 75 year old white female. She is originally from [place]. She said that she was diagnosed with "polycythemia vera" of her [place]. She had a bone marrow test done about 8 years ago. She says she was phlebotomized we monthly period. She subsequently moved down to [place]. She moved out her husband. She has family down here. Patient has diabetes. She has hypertension. She is obese. I might sure she has sleep apnea.  She has been on JAKAFI. She is placed on JAKAFI by another hematologist. She currently is on 25 mg by mouth twice a day.  She is worried that she's not been phlebotomized. She feels tired. She just has a lot of complaints. I'm not sure if the complaints are related to her diabetes and convocation from this.  She has had no rashes.  She was recently hospitalized. She has had cardiac issues. She has had a stent placed. This was last year. She has had 2 stents placed. She's been on antiplatelet therapy. Because this, she's had some bruising.  She was recently hospitalized. She had an echocardiogram done which showed an ejection fraction of 65 and 65%. 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  Her last ultrasound was done back in December. This is show splenomegaly. 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  As far second toe, I'm not seeing any genetic studies done for her blood problem. I'm sure that a probably redone up in [place].  Her blood sugars have been poorly controlled. She says her blood sugars are either low or high.  She's not noted any palpable lymph glands.  She's had no abdominal pain.  Overall, her performance status is ECOG 1     Past Medical History  Diagnosis Date  . Hypertension   . Hyperlipidemia   .  Diabetes mellitus 2008  . Depression   . Obesity   . Psoriasis   . Polycythemia     Dr. Elease Hashimoto- [facility]  . Herpes simplex   . Gout   . Hyperglycemia 05/31/2013  . C. difficile colitis   . Cirrhosis ([facility])   . Cancer ([place])     SKIN  . 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 Candidiasis of skin 09/30/2014  . Myocardial infarction ([place])   . Allergic rhinitis 01/23/2016  :  Past Surgical History  Procedure Laterality Date  . Breast surgery Left     milk duct  . Total abdominal hysterectomy w/ bilateral salpingoophorectomy      for heavy periods with appendectomy  . Tonsillectomy    . Colonoscopy    . Left heart catheterization with coronary angiogram N/A 01/27/2015    Procedure: LEFT HEART CATHETERIZATION WITH CORONARY ANGIOGRAM;  Surgeon: [name], MD;  Location: [facility] CATH LAB;  Service: Cardiovascular;  Laterality: N/A;  :   Current outpatient prescriptions:  .  amLODipine (NORVASC) 5 MG tablet, Take 1 tablet (5 mg total) by mouth daily., Disp: 30 tablet, Rfl: 1 .  canagliflozin (INVOKANA) 100 MG TABS tablet, Take 1 tablet (100 mg total) by mouth daily., Disp: 30 tablet, Rfl: 5 .  dicyclomine (BENTYL) 10 MG capsule, Take 1 tab by mouth every morning, may take twice daily as needed., Disp: 60 capsule, Rfl: 8 .  furosemide (LASIX) 20 MG tablet, Take 1 tablet (20 mg total) by mouth daily., Disp: 20 tablet, Rfl: 0 .  hydrochlorothiazide (HYDRODIURIL) 25 MG tablet, , Disp: , Rfl:  .  insulin regular human CONCENTRATED (HUMULIN R U-500 KWIKPEN) 500 UNIT/ML kwikpen, Inject 85 units under the skin before breakfast and 100 units before dinner., Disp: 18 pen, Rfl: 1 .  loperamide (IMODIUM A-D) 2 MG tablet, Take 2 mg by mouth daily as needed for diarrhea or loose stools. , Disp: , Rfl:  .  losartan (COZAAR) 100 MG tablet, Take 1 tablet (100 mg total) by mouth daily., Disp: 90 tablet, Rfl: 1 .  nitroGLYCERIN (NITROSTAT) 0.4 MG SL tablet, Place 1 tablet (0.4 mg total) under  the tongue every 5 (five) minutes as needed for chest pain., Disp: 25 tablet, Rfl: 3 .  NONFORMULARY OR COMPOUNDED ITEM, Neuropathy cream: mix Amitriptyline 2%, Lidocaine 2%, and Ketamine 1% - apply on feet 2x a day [facility]., Disp: 1 each, Rfl: 2 .  omega-3 acid ethyl esters (LOVAZA) 1 G capsule, Take 1 capsule (1 g total) by mouth 2 (two) times daily., Disp: 60 capsule, Rfl: 11 .  ONETOUCH VERIO test strip, , Disp: , Rfl:  .  rosuvastatin (CRESTOR) 20 MG tablet, Take 1 tablet (20 mg total) by mouth daily., Disp: 90 tablet, Rfl: 3 .  ruxolitinib phosphate (JAKAFI) 25 MG tablet, Take 1 tablet (25 mg total) by mouth 2 (two) times daily. (Patient taking differently: Take 20 mg by mouth 2 (two) times daily. ), Disp: 60 tablet, Rfl: 3 .  ticagrelor (BRILINTA) 60 MG TABS tablet, Take 1 tablet (60 mg total) by mouth 2 (two) times daily., Disp: 28 tablet, Rfl: 0:  :  Allergies  Allergen Reactions  . Lisinopril Cough  . Tape Hives  . Doxycycline Hives, Swelling and Rash  . Latex Hives, Itching and Rash  :  Family History  Problem Relation Age of Onset  . Diabetes Mother   . Heart disease Mother     CAD  . Hyperlipidemia Mother   . Hypertension Mother   . Kidney disease Mother   . Kidney disease Father   . Breast cancer Maternal Aunt   . Heart disease Maternal Grandmother   . Heart disease Other     maternal aunts and uncles  :  Social History   Social History  . Marital Status: Married    Spouse Name: N/A  . Number of Children: 2  . Years of Education: 2 yr colle   Occupational History  . housewife    Social History Main Topics  . Smoking status: Former Smoker -- 48 years    Types: Cigarettes    Quit date: 10/18/1986  . Smokeless tobacco: Never Used  . Alcohol Use: No  . Drug Use: No  . Sexual Activity:    Partners: Male    Birth Control/ Protection: None   Other Topics Concern  . Not on file   Social History Narrative   2 caffeine drink per day.  No  regular exercise.    :  Pertinent items are noted in HPI.  Exam: _0 @ Obese white female in no obvious distress. Vital signs show temperature of 97.4. Pulse 101. Blood pressure 140/60. Weight is 229 pounds. Hent exam shows no ocular or oral lesions. There are no palpable cervical or supraclavicular lymph nodes. Lungs are clear laterally. Cardiac exam regular rate and rhythm with no murmurs, rubs or bruits. Abdomen is soft. She is obese.  She has decent bowel sounds. There is no guarding or rebound tenderness. She has no obvious hepatomegaly. I cannot feel her spleen tip. Back exam shows no tenderness over the spine, ribs or hips. Externally shows no clubbing, cyanosis or edema. She has decent range of motion of her joints. Skin exam shows slightly dry skin. Neurological exam shows no focal neurological deficits.    Recent Labs  03/09/16 0950  WBC 23.4*  HGB 10.4*  HCT 31.8*  PLT 450*   No results for input(s): NA, K, CL, CO2, GLUCOSE, BUN, CREATININE, CALCIUM in the last 72 hours.  Blood smear review:  Normochromic and normocytic papillation of red blood cells. She has no nucleated red blood cells. I see no teardrop cells. She has no schistocytes or spherocytes. White cells are slightly increased in number. She does have some immature myeloid cells. I see some myelocytes and metamyelocytes. I do not see any blasts. There were some atypical lymphocytes. Platelets were increased in number. She has some large platelets. Platelets were well granulated.  Pathology: None     Assessment and Plan:  Mr. 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 She is on JAKAFI. This probably is why she is somewhat anemic.  I think we really need to get a better idea as to where she stands with her myeloproliferative process. I think a bone marrow biopsy would be helpful. I would be worried that she might be transforming to something different. Again I cannot feel her spleen but she  is quite large.  I really think that we need to test for genetic mutations. As such, I will send off the 3 genetic studies that we run for myeloproliferative disorders.  I do want to get a another ultrasound of her spleen to see how it looks now. I think this would be helpful.  I spent about an hour with she and her husband. She is quite worried about her situation. I tried to reassure her that I did not see anything yet that looks suspicious that she might be transforming to leukemia.  I think that in the long run, her big problem was going to be her diabetes, obesity, and probable underlying coronary artery disease.  I would like to see her back in about 3-4 weeks.

## 2016-03-10 ENCOUNTER — Telehealth: Payer: Self-pay | Admitting: Internal Medicine

## 2016-03-10 NOTE — Telephone Encounter (Signed)
PT spouse calling to let you know that the medication has been approved and they are shipping it here to the office.

## 2016-03-10 NOTE — Telephone Encounter (Signed)
Noted  

## 2016-03-17 ENCOUNTER — Other Ambulatory Visit: Payer: Self-pay | Admitting: Radiology

## 2016-03-17 LAB — MPN-ET/MYELOFIBROSIS (JAK2 V617F-MPL515-CALR)

## 2016-03-18 ENCOUNTER — Other Ambulatory Visit: Payer: Self-pay | Admitting: General Surgery

## 2016-03-18 ENCOUNTER — Encounter: Payer: Self-pay | Admitting: Hematology & Oncology

## 2016-03-19 ENCOUNTER — Encounter: Payer: Self-pay | Admitting: Cardiovascular Disease

## 2016-03-19 ENCOUNTER — Ambulatory Visit (INDEPENDENT_AMBULATORY_CARE_PROVIDER_SITE_OTHER): Payer: Medicare Other | Admitting: Cardiovascular Disease

## 2016-03-19 VITALS — BP 120/68 | HR 79 | Ht 64.0 in | Wt 231.2 lb

## 2016-03-19 DIAGNOSIS — I2510 Atherosclerotic heart disease of native coronary artery without angina pectoris: Secondary | ICD-10-CM | POA: Diagnosis not present

## 2016-03-19 DIAGNOSIS — R0683 Snoring: Secondary | ICD-10-CM

## 2016-03-19 DIAGNOSIS — G4710 Hypersomnia, unspecified: Secondary | ICD-10-CM | POA: Diagnosis not present

## 2016-03-19 DIAGNOSIS — R400 Somnolence: Secondary | ICD-10-CM

## 2016-03-19 DIAGNOSIS — I10 Essential (primary) hypertension: Secondary | ICD-10-CM

## 2016-03-19 DIAGNOSIS — E785 Hyperlipidemia, unspecified: Secondary | ICD-10-CM

## 2016-03-19 DIAGNOSIS — D45 Polycythemia vera: Secondary | ICD-10-CM

## 2016-03-19 DIAGNOSIS — E1165 Type 2 diabetes mellitus with hyperglycemia: Secondary | ICD-10-CM

## 2016-03-19 NOTE — Patient Instructions (Signed)
Medication Instructions: Dr [name] recommends that you continue on your current medications as directed. Please refer to the Current Medication list given to you today.  Labwork: NONE ORDERED  Testing/Procedures: 1. Sleep Study - Your physician has recommended that you have a sleep study. This test records several body functions during sleep, including: brain activity, eye movement, oxygen and carbon dioxide blood levels, heart rate and rhythm, breathing rate and rhythm, the flow of air through your mouth and nose, snoring, body muscle movements, and chest and belly movement.  Follow-up: Dr [name] recommends that you schedule a follow-up appointment in 4 months.  If you need a refill on your cardiac medications before your next appointment, please call your pharmacy.   **Please contact Dr [name] to see if you need cardiac clearance for your bone marrow biopsy!!

## 2016-03-21 DIAGNOSIS — E1165 Type 2 diabetes mellitus with hyperglycemia: Secondary | ICD-10-CM | POA: Insufficient documentation

## 2016-03-21 DIAGNOSIS — G4710 Hypersomnia, unspecified: Secondary | ICD-10-CM | POA: Insufficient documentation

## 2016-03-21 NOTE — Progress Notes (Signed)
Patient ID: Amanda [name], female   DOB: Jun 21, 1941, 75 y.o.   MRN: [id number]     HPI: Amanda [name] is a 75 y.o. female who presents to the office today for an 8 month follow-up cardiology evaluation.    Amanda [name] has a history of hypertension, type 2 diabetes mellitus, polycythemia vera, as well as peripheral neuropathy.  She developed new onset chest pain on 01/27/2015 and was found have 3 mm ST segment elevation by EMS.  During transport to the hospital she developed idioventricular rhythm which was treated with amiodarone bolus.  I took her acutely to the cardiac catheterization laboratory on 01/27/2015.  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  She underwent successful PCI intervention with  insertion of a  Xience Alpine 3.023 mm DES stent postdilated to 3.15 millimeters in the mid lesion and the distal stenosis was treated with a 2.518 mm Xience Alpine DES stent postdilated to 2.57 mm.  It was felt that her idioventricular rhythm was probably the result of spontaneous reperfusion in route to the hospital.  She initially was discharged after 3 days to [facility] rehabilitation which did she not find to be helpful and went home 3 days later with home health care coming to her house.  During her hospitalization, an echo Doppler study revealed an EF of 50-55%.  She had abnormal tissue Doppler.  There is mitral annular calcification and moderate LA dilatation.  She had abnormal posterior lateral and septal strain.  When I saw her last year she was having difficulty with bloating and diarrhea.  She denied any recurrent episodes of chest pain.  She admits to exertional shortness of breath.  She is not very active and does not ambulate well.   After one year of full dose dual antiplatelet therapy, she recently  was started on reduced dose of Brilinta at 60 mg twice a day per Pegasys trial data.  She was hospitalized from April 12 through 01/30/2016 a several week history of shortness of breath.  Her hospitalization she was seen by cardiology and was not felt to be due to CHF.  She presented with markedly elevated glucose with metabolic acidosis but without an anion gap.  She has continued to be on Jakafi for  polycythemia vera.  She was found to have a 7 x 6 mm nodule opacify in the superior segment of the right lower lobe on CT and a hollow of noncontrast chest CT at 6-12 months was recommended.  She has continued to have fatigue.  She saw Dr. [name] of her in follow-up evaluation and he was concerned that additional evaluation of her myeloproliferative disorder and according to the patient.  She is planning to have a bone marrow biopsy performed.  With reference to her fatigue, she is extremely tired.  At times she may go to bed at 6:30 at night.  She does snore.  Her sleep is nonrestorative.  She admits to daytime sleepiness.  She has never had an evaluation for sleep apnea.  She presents for evaluation.   Past Medical History  Diagnosis Date  . Hypertension   . Hyperlipidemia   . Diabetes mellitus 2008  . Depression   . Obesity   . Psoriasis   . Polycythemia     Dr. Elease Hashimoto- [facility]  hematology  . Herpes simplex   . Gout   . Hyperglycemia 05/31/2013  . C. difficile colitis   . Cirrhosis ([place])   . Cancer ([place])     SKIN  . Neuromuscular disorder ([place])     BELL PALSY  . Candidiasis of skin 09/30/2014  . Myocardial infarction ([place])   . Allergic rhinitis 01/23/2016    Past Surgical History  Procedure Laterality Date  . Breast surgery Left     milk duct  . Total abdominal hysterectomy w/ bilateral salpingoophorectomy      for heavy periods with appendectomy  . Tonsillectomy    . Colonoscopy    . Left heart catheterization with coronary angiogram N/A 01/27/2015    Procedure: LEFT HEART  CATHETERIZATION WITH CORONARY ANGIOGRAM;  Surgeon: [name], MD;  [facility] C[facility] LAB;  Service: Cardiovascular;  Laterality: N/A;    Allergies  Allergen Reactions  . Lisinopril Cough  . Tape Hives  . Doxycycline Hives, Swelling and Rash  . Latex Hives, Itching and Rash    Current Outpatient Prescriptions  Medication Sig Dispense Refill  . amLODipine (NORVASC) 5 MG tablet Take 1 tablet (5 mg total) by mouth daily. 30 tablet 1  . canagliflozin (INVOKANA) 100 MG TABS tablet Take 1 tablet (100 mg total) by mouth daily. 30 tablet 5  . dicyclomine (BENTYL) 10 MG capsule Take 1 tab by mouth every morning, may take twice daily as needed. 60 capsule 8  . furosemide (LASIX) 20 MG tablet Take 1 tablet (20 mg total) by mouth daily. 20 tablet 0  . hydrochlorothiazide (HYDRODIURIL) 25 MG tablet     . insulin regular human CONCENTRATED (HUMULIN R U-500 KWIKPEN) 500 UNIT/ML kwikpen Inject 85 units under the skin before breakfast and 100 units before dinner. 18 pen 1  . loperamide (IMODIUM A-D) 2 MG tablet Take 2 mg by mouth daily as needed for diarrhea or loose stools.     Marland Kitchen losartan (COZAAR) 100 MG tablet Take 1 tablet (100 mg total) by mouth daily. 90 tablet 1  . nitroGLYCERIN (NITROSTAT) 0.4 MG SL tablet Place 1 tablet (0.4 mg total) under the tongue every 5 (five) minutes as needed for chest pain. 25 tablet 3  . NONFORMULARY OR COMPOUNDED ITEM Neuropathy cream: mix Amitriptyline 2%, Lidocaine 2%, and Ketamine 1% - apply on feet 2x a day [facility]. 1 each 2  . omega-3 acid ethyl esters (LOVAZA) 1 G capsule Take 1 capsule (1 g total) by mouth 2 (two) times daily. 60 capsule 11  . ONETOUCH VERIO test strip     . rosuvastatin (CRESTOR) 20 MG tablet Take 1 tablet (20 mg total) by mouth daily. 90 tablet 3  . ruxolitinib phosphate (JAKAFI) 25 MG tablet Take 1 tablet (25 mg total) by mouth 2 (two) times daily. (Patient taking differently: Take 20 mg by mouth 2 (two) times daily. ) 60  tablet 3  . ticagrelor (BRILINTA) 60 MG TABS tablet Take 1 tablet (60 mg total) by mouth 2 (two) times daily. 28 tablet 0   No current facility-administered medications for this visit.    Social History   Social History  . Marital Status: Married    Spouse Name: N/A  . Number of Children: 2  . Years of Education: 2 yr colle   Occupational History  . housewife    Social History Main Topics  . Smoking status: Former Smoker -- 48 years    Types: Cigarettes    Quit date: 10/18/1986  .  Smokeless tobacco: Never Used  . Alcohol Use: No  . Drug Use: No  . Sexual Activity:    Partners: Male    Birth Control/ Protection: None   Other Topics Concern  . Not on file   Social History Narrative   2 caffeine drink per day.  No regular exercise.      Family History  Problem Relation Age of Onset  . Diabetes Mother   . Heart disease Mother     CAD  . Hyperlipidemia Mother   . Hypertension Mother   . Kidney disease Mother   . Kidney disease Father   . Breast cancer Maternal Aunt   . Heart disease Maternal Grandmother   . Heart disease Other     maternal aunts and uncles    ROS General: Negative; No fevers, chills, or night sweats HEENT: Negative; No changes in vision or hearing, sinus congestion, difficulty swallowing Pulmonary: Negative; No cough, wheezing, shortness of breath, hemoptysis Cardiovascular: See HPI: No chest pain, presyncope, syncope, palpatations GI: Positive for abdominal bloating and occasional to frequent diarrhea. GU: Negative; No dysuria, hematuria, or difficulty voiding Musculoskeletal: Negative; no myalgias, joint pain, or weakness Hematologic: Positive for polycythemia vera and anemia and leukocytosis. Endocrine: Negative; no heat/cold intolerance; no diabetes, Neuro: Negative; no changes in balance, headaches Skin: Negative; No rashes or skin lesions Psychiatric: Negative; No behavioral problems, depression Sleep: Positive for snoring,  nonrestorative sleep,daytime sleepiness, and hypersomnolence; no bruxism, restless legs, hypnogognic hallucinations. Other comprehensive 14 point system review is negative   Physical Exam BP 120/68 mmHg  Pulse 79  Ht _0  (1.626 m)  Wt 231 lb 3.2 oz (104.872 kg)  BMI 39.67 kg/m2  SpO2 98%   Repeat blood pressure by me 130/78. Wt Readings from Last 3 Encounters:  03/19/16 231 lb 3.2 oz (104.872 kg)  03/09/16 229 lb (103.874 kg)  02/11/16 226 lb (102.513 kg)   General: Alert, oriented, no distress.  Skin: normal turgor, no rashes, warm and dry HEENT: Normocephalic, atraumatic. Pupils equal round and reactive to light; sclera anicteric; extraocular muscles intact, No lid lag; Nose without nasal septal hypertrophy; Mouth/Parynx with upper and lower dentures; Mallinpatti scale 3 Neck: Thick neck;  No JVD, no carotid bruits; normal carotid upstroke Lungs: clear to ausculatation and percussion bilaterally; no wheezing or rales, normal inspiratory and expiratory effort Chest wall: without tenderness to palpitation Heart: PMI not displaced, RRR, s1 s2 normal, 1/6 systolic murmur, No diastolic murmur, no rubs, gallops, thrills, or heaves Abdomen: soft, nontender; no hepatosplenomehaly, BS+; abdominal aorta nontender and not dilated by palpation. Back: no CVA tenderness Pulses: 2+  Musculoskeletal: full range of motion, normal strength, no joint deformities Extremities: Pulses 2+, no clubbing cyanosis or edema, Homan's sign negative  Neurologic: grossly nonfocal; Cranial nerves grossly wnl Psychologic: Normal mood and affect  ECG (independently read by me): Normal sinus rhythm 80 bpm.  No ECG evidence for prior MI.  QTC interval 463 Amanda.  July 2016 ECG (independently read by me):  Normal sinus rhythm at 96 bpm. No significant ST changes.  No ECG evidence of prior MI.  LABS:  BMP Latest Ref Rng 02/05/2016 01/30/2016 01/29/2016  Glucose 65 - 99 mg/dL 144(H) 132(H) 585(HH)  BUN 7 - 25 mg/dL  _1 Creatinine 0.60 - 0.93 mg/dL 1.06(H) 0.98 1.14(H)  Sodium 135 - 146 mmol/L 138 140 133(L)  Potassium 3.5 - 5.3 mmol/L 4.8 3.5 5.1  Chloride 98 - 110 mmol/L 104 106 101  CO2 20 -  31 mmol/L 23 24 17(L)  Calcium 8.6 - 10.4 mg/dL 9.9 8.6(L) 8.7(L)    Hepatic Function Latest Ref Rng 02/05/2016 01/26/2016 01/09/2016  Total Protein 6.1 - 8.1 g/dL 6.8 6.5 6.9  Albumin 3.6 - 5.1 g/dL 4.1 4.1 4.1  AST 10 - 35 U/L 41(H) 32 63(H)  ALT 6 - 29 U/L _0 Alk Phosphatase 33 - 130 U/L 94 106 115  Total Bilirubin 0.2 - 1.2 mg/dL 0.8 0.7 0.9   CBC Latest Ref Rng 03/09/2016 02/05/2016 01/30/2016  WBC 3.9 - 10.0 10e3/uL 23.4(H) 30.4(H) 18.0(H)  Hemoglobin 11.6 - 15.9 g/dL 10.4(L) 13.7 11.3(L)  Hematocrit 34.8 - 46.6 % 31.8(L) 41.8 35.6(L)  Platelets 145 - 400 10e3/uL 450(H) 859(H) 474(H)   Lab Results  Component Value Date   MCV 93 03/09/2016   MCV 89.3 02/05/2016   MCV 88.3 01/30/2016    Lab Results  Component Value Date   TSH 3.19 01/26/2016    BNP    Component Value Date/Time   BNP 77.9 01/28/2016 1647   BNP 56.3 01/26/2016 1340    ProBNP    Component Value Date/Time   PROBNP 311.6* 01/29/2014 1203     Lipid Panel     Component Value Date/Time   CHOL 168 01/27/2015 1320   TRIG 266* 01/27/2015 1320   HDL 27* 01/27/2015 1320   CHOLHDL 6.2 01/27/2015 1320   VLDL 53* 01/27/2015 1320   LDLCALC 88 01/27/2015 1320     RADIOLOGY: No results found.    ASSESSMENT AND PLAN: Amanda [name] is a 75 year old Caucasian female who is originally from [place] who presented on 01/27/2015 with a left circumflex STEMI.  She most likely had some spontaneous reperfusion which led to transient to her idioventricular rhythm.  She underwent successful intervention.  Her echo Doppler study 2 days later showed an EF of 50-55% with fairly normal wall motion.  When I initially saw her last year  in the office she was not well beta blocked and further titrated her beta blocker to  metoprolol 100 mg twice a day.  Her blood pressure today is improved.  Her resting pulse around 80.  When she was hospitalized she was started on atorvastatin but this had been stopped secondary to questionable issues of nausea.  I instituted Crestor 20 mg and she seems to tolerate this well.  She does not have significant edema.   Her blood pressure today is stable and on repeat by me was 124/72 on amlodipine 5 mg, Lasix 20 mg, and losartan.100 mg, she has been on dual antiplatelet therapy and recently her Brilinta dose was reduced from 90 twice a day to 60 twice a day.  However, I'm not certain if she still taking baby aspirin.  I raised the issue with her concerning her upcoming bone marrow biopsy that she may need to hold Brilinta for 5 days, but she needs to check with Dr. [name].  She does have significant fatigability, snoring, nonrestorative sleep, obesity, and a thick neck with a Mallampati scale of 3.  I am highly suspect that she may have obstructive sleep apnea and I'm scheduling her for a sleep study for further evaluation.  I will see her in 3-4 months for follow-up evaluation.  If she tests positive for sleep apnea.  I will need to see her within 90 days of CPAP initiation per Medicare guidelines. Time spent: 25 minutes  [name], MD, [facility]  03/21/2016 2:45 PM

## 2016-03-22 ENCOUNTER — Ambulatory Visit (HOSPITAL_COMMUNITY)
Admission: RE | Admit: 2016-03-22 | Discharge: 2016-03-22 | Disposition: A | Discharge status: Home / Self Care | Payer: Medicare Other | Source: Ambulatory Visit | Attending: Hematology & Oncology | Admitting: Hematology & Oncology

## 2016-03-22 ENCOUNTER — Ambulatory Visit: Payer: Medicare Other | Admitting: Family Medicine

## 2016-03-22 ENCOUNTER — Encounter (HOSPITAL_COMMUNITY): Payer: Self-pay

## 2016-03-22 DIAGNOSIS — D45 Polycythemia vera: Secondary | ICD-10-CM | POA: Insufficient documentation

## 2016-03-22 DIAGNOSIS — I252 Old myocardial infarction: Secondary | ICD-10-CM | POA: Insufficient documentation

## 2016-03-22 DIAGNOSIS — E785 Hyperlipidemia, unspecified: Secondary | ICD-10-CM | POA: Insufficient documentation

## 2016-03-22 DIAGNOSIS — Z87891 Personal history of nicotine dependence: Secondary | ICD-10-CM | POA: Insufficient documentation

## 2016-03-22 DIAGNOSIS — D649 Anemia, unspecified: Secondary | ICD-10-CM | POA: Insufficient documentation

## 2016-03-22 DIAGNOSIS — Z888 Allergy status to other drugs, medicaments and biological substances status: Secondary | ICD-10-CM | POA: Diagnosis not present

## 2016-03-22 DIAGNOSIS — Z881 Allergy status to other antibiotic agents status: Secondary | ICD-10-CM | POA: Diagnosis not present

## 2016-03-22 DIAGNOSIS — Z8249 Family history of ischemic heart disease and other diseases of the circulatory system: Secondary | ICD-10-CM | POA: Insufficient documentation

## 2016-03-22 DIAGNOSIS — K7460 Unspecified cirrhosis of liver: Secondary | ICD-10-CM | POA: Diagnosis not present

## 2016-03-22 DIAGNOSIS — D759 Disease of blood and blood-forming organs, unspecified: Secondary | ICD-10-CM | POA: Diagnosis not present

## 2016-03-22 DIAGNOSIS — D72829 Elevated white blood cell count, unspecified: Secondary | ICD-10-CM | POA: Insufficient documentation

## 2016-03-22 DIAGNOSIS — Z91040 Latex allergy status: Secondary | ICD-10-CM | POA: Diagnosis not present

## 2016-03-22 DIAGNOSIS — Z794 Long term (current) use of insulin: Secondary | ICD-10-CM | POA: Insufficient documentation

## 2016-03-22 DIAGNOSIS — E119 Type 2 diabetes mellitus without complications: Secondary | ICD-10-CM | POA: Insufficient documentation

## 2016-03-22 DIAGNOSIS — Z7902 Long term (current) use of antithrombotics/antiplatelets: Secondary | ICD-10-CM | POA: Diagnosis not present

## 2016-03-22 DIAGNOSIS — I10 Essential (primary) hypertension: Secondary | ICD-10-CM | POA: Diagnosis not present

## 2016-03-22 DIAGNOSIS — Z79899 Other long term (current) drug therapy: Secondary | ICD-10-CM | POA: Diagnosis not present

## 2016-03-22 DIAGNOSIS — Z841 Family history of disorders of kidney and ureter: Secondary | ICD-10-CM | POA: Diagnosis not present

## 2016-03-22 LAB — CBC WITH DIFFERENTIAL/PLATELET
BASOS ABS: 0.1 10*3/uL (ref 0.0–0.1)
BASOS PCT: 0 %
EOS ABS: 0.3 10*3/uL (ref 0.0–0.7)
EOS PCT: 2 %
HEMATOCRIT: 33.6 % — AB (ref 36.0–46.0)
HEMOGLOBIN: 10.7 g/dL — AB (ref 12.0–15.0)
LYMPHS PCT: 9 %
Lymphs Abs: 1.2 10*3/uL (ref 0.7–4.0)
MCH: 28.5 pg (ref 26.0–34.0)
MCHC: 31.8 g/dL (ref 30.0–36.0)
MCV: 89.6 fL (ref 78.0–100.0)
Monocytes Absolute: 1 10*3/uL (ref 0.1–1.0)
Monocytes Relative: 8 %
Neutro Abs: 11.1 10*3/uL — ABNORMAL HIGH (ref 1.7–7.7)
Neutrophils Relative %: 81 %
Platelets: 332 10*3/uL (ref 150–400)
RBC: 3.75 MIL/uL — ABNORMAL LOW (ref 3.87–5.11)
RDW: 17.4 % — AB (ref 11.5–15.5)
WBC: 13.6 10*3/uL — AB (ref 4.0–10.5)

## 2016-03-22 LAB — BASIC METABOLIC PANEL
ANION GAP: 7 (ref 5–15)
BUN: 17 mg/dL (ref 6–20)
CALCIUM: 8.7 mg/dL — AB (ref 8.9–10.3)
CO2: 22 mmol/L (ref 22–32)
Chloride: 108 mmol/L (ref 101–111)
Creatinine, Ser: 0.93 mg/dL (ref 0.44–1.00)
GFR calc Af Amer: >60 mL/min (ref >60)
GFR, EST NON AFRICAN AMERICAN: 58 mL/min — AB (ref >60)
GLUCOSE: 236 mg/dL — AB (ref 65–99)
Potassium: 4.5 mmol/L (ref 3.5–5.1)
Sodium: 137 mmol/L (ref 135–145)

## 2016-03-22 LAB — GLUCOSE, CAPILLARY
GLUCOSE-CAPILLARY: 220 mg/dL — AB (ref 65–99)
GLUCOSE-CAPILLARY: 306 mg/dL — AB (ref 65–99)

## 2016-03-22 LAB — PROTIME-INR
INR: 1.27 (ref 0.00–1.49)
Prothrombin Time: 15.6 seconds — ABNORMAL HIGH (ref 11.6–15.2)

## 2016-03-22 LAB — BONE MARROW EXAM

## 2016-03-22 MED ORDER — FENTANYL CITRATE (PF) 100 MCG/2ML IJ SOLN
INTRAMUSCULAR | Status: AC | PRN
  Administered 2016-03-22: 50 ug via INTRAVENOUS
  Administered 2016-03-22 (×2): 25 ug via INTRAVENOUS

## 2016-03-22 MED ORDER — SODIUM CHLORIDE 0.9 % IV SOLN
INTRAVENOUS | Status: DC
  Administered 2016-03-22: 10:00:00 via INTRAVENOUS

## 2016-03-22 MED ORDER — MIDAZOLAM HCL 2 MG/2ML IJ SOLN
INTRAMUSCULAR | Status: AC | PRN
  Administered 2016-03-22 (×4): 1 mg via INTRAVENOUS

## 2016-03-22 MED ORDER — MIDAZOLAM HCL 2 MG/2ML IJ SOLN
INTRAMUSCULAR | Status: AC
  Filled 2016-03-22: qty 4

## 2016-03-22 MED ORDER — FENTANYL CITRATE (PF) 100 MCG/2ML IJ SOLN
INTRAMUSCULAR | Status: AC
  Filled 2016-03-22: qty 2

## 2016-03-22 NOTE — Procedures (Signed)
Technically successful CT guided bone marrow aspiration and biopsy of left iliac crest. EBL: None No immediate complications.    SignedSandi Mariscal Pager: [phone] 03/22/2016, 11:41 AM

## 2016-03-22 NOTE — Consult Note (Signed)
Chief Complaint: Patient was seen in consultation today for CT-guided bone marrow biopsy  Referring Physician(s): Ennever,Peter R  Supervising Physician: Sandi Mariscal  Patient Status: Out-pt  History of Present Illness: Amanda [name] is a 75 y.o. female with significant past medical history as listed below as well as previously diagnosed myeloproliferative disorder/polycythemia vera in [place], currently on Jakafi therapy. Recent laboratory evaluation has revealed leukocytosis, anemia and thrombocytosis. Request now received for CT-guided bone marrow biopsy to rule out potential leukemic transformation.  Past Medical History  Diagnosis Date  . Hypertension   . Hyperlipidemia   . Diabetes mellitus 2008  . Depression   . Obesity   . Psoriasis   . Polycythemia     Dr. Elease Hashimoto- [facility] hematology  . Herpes simplex   . Gout   . Hyperglycemia 05/31/2013  . C. difficile colitis   . Cirrhosis ([place])   . Cancer ([place])     SKIN  . Neuromuscular disorder ([place])     BELL PALSY  . Candidiasis of skin 09/30/2014  . Myocardial infarction ([place])   . Allergic rhinitis 01/23/2016    Past Surgical History  Procedure Laterality Date  . Breast surgery Left     milk duct  . Total abdominal hysterectomy w/ bilateral salpingoophorectomy      for heavy periods with appendectomy  . Tonsillectomy    . Colonoscopy    . Left heart catheterization with coronary angiogram N/A 01/27/2015    Procedure: LEFT HEART CATHETERIZATION WITH CORONARY ANGIOGRAM;  Surgeon: [name], MD;  Location: [facility] CATH LAB;  Service: Cardiovascular;  Laterality: N/A;    Allergies: Lisinopril; Tape; Doxycycline; and Latex  Medications: Prior to Admission medications   Medication Sig Start Date End Date Taking? Authorizing Provider  amLODipine (NORVASC) 5 MG tablet Take 1 tablet (5 mg total) by mouth daily. 01/26/16   Hali Marry, MD  canagliflozin (INVOKANA) 100 MG TABS tablet Take 1 tablet (100 mg total)  by mouth daily. 02/12/16   [name], MD  dicyclomine (BENTYL) 10 MG capsule Take 1 tab by mouth every morning, may take twice daily as needed. 05/15/15   Amy S Esterwood, PA-C  furosemide (LASIX) 20 MG tablet Take 1 tablet (20 mg total) by mouth daily. 01/26/16   Hali Marry, MD  hydrochlorothiazide (HYDRODIURIL) 25 MG tablet  01/26/16   Historical Provider, MD  insulin regular human CONCENTRATED (HUMULIN R U-500 KWIKPEN) 500 UNIT/ML kwikpen Inject 85 units under the skin before breakfast and 100 units before dinner. 02/11/16   [name], MD  loperamide (IMODIUM A-D) 2 MG tablet Take 2 mg by mouth daily as needed for diarrhea or loose stools.     Historical Provider, MD  losartan (COZAAR) 100 MG tablet Take 1 tablet (100 mg total) by mouth daily. 01/26/16   Hali Marry, MD  nitroGLYCERIN (NITROSTAT) 0.4 MG SL tablet Place 1 tablet (0.4 mg total) under the tongue every 5 (five) minutes as needed for chest pain. 02/06/15   [name], MD  NONFORMULARY OR COMPOUNDED ITEM Neuropathy cream: mix Amitriptyline 2%, Lidocaine 2%, and Ketamine 1% - apply on feet 2x a day [facility]. 01/23/15   [name], MD  omega-3 acid ethyl esters (LOVAZA) 1 G capsule Take 1 capsule (1 g total) by mouth 2 (two) times daily. 02/07/15   [name], MD  ONETOUCH VERIO test strip  01/26/16   Historical Provider, MD  rosuvastatin (CRESTOR) 20 MG tablet  Take 1 tablet (20 mg total) by mouth daily. 05/12/15   [name], MD  ruxolitinib phosphate (JAKAFI) 25 MG tablet Take 1 tablet (25 mg total) by mouth 2 (two) times daily. Patient taking differently: Take 20 mg by mouth 2 (two) times daily.  10/16/15   [name], MD  ticagrelor (BRILINTA) 60 MG TABS tablet Take 1 tablet (60 mg total) by mouth 2 (two) times daily. 03/02/16   [name], MD     Family History  Problem Relation Age of Onset  . Diabetes Mother   . Heart disease Mother     CAD  . Hyperlipidemia Mother   .  Hypertension Mother   . Kidney disease Mother   . Kidney disease Father   . Breast cancer Maternal Aunt   . Heart disease Maternal Grandmother   . Heart disease Other     maternal aunts and uncles    Social History   Social History  . Marital Status: Married    Spouse Name: N/A  . Number of Children: 2  . Years of Education: 2 yr colle   Occupational History  . housewife    Social History Main Topics  . Smoking status: Former Smoker -- 48 years    Types: Cigarettes    Quit date: 10/18/1986  . Smokeless tobacco: Never Used  . Alcohol Use: No  . Drug Use: No  . Sexual Activity:    Partners: Male    Birth Control/ Protection: None   Other Topics Concern  . None   Social History Narrative   2 caffeine drink per day.  No regular exercise.        Review of Systems  Constitutional: Positive for fatigue. Negative for fever and chills.  Respiratory:       Occ cough, dyspnea with exertion  Cardiovascular: Negative for chest pain.  Gastrointestinal: Negative for nausea, vomiting, abdominal pain and blood in stool.  Genitourinary: Negative for dysuria and hematuria.  Musculoskeletal: Positive for back pain.  Neurological: Negative for headaches.    Vital Signs: BP 151/70 mmHg  Pulse 92  Temp(Src) 98.4 F (36.9 C) (Oral)  Resp 18  Wt 230 lb 6.4 oz (104.509 kg)  SpO2 93%  Physical Exam  Constitutional: She is oriented to person, place, and time. She appears well-developed and well-nourished.  Cardiovascular: Normal rate and regular rhythm.   Pulmonary/Chest: Effort normal.  Distant BS bilat  Abdominal: Soft. Bowel sounds are normal. There is no tenderness.  obese  Musculoskeletal: Normal range of motion. She exhibits no edema.  Neurological: She is alert and oriented to person, place, and time.    Mallampati Score:     Imaging: No results found.  Labs:  CBC:  Recent Labs  01/29/16 0059 01/30/16 0426 02/05/16 0933 03/09/16 0950  WBC 20.2* 18.0*  30.4* 23.4*  HGB 12.1 11.3* 13.7 10.4*  HCT 37.6 35.6* 41.8 31.8*  PLT 573* 474* 859* 450*    COAGS:  Recent Labs  01/09/16 1732  INR 1.20  APTT 32    BMP:  Recent Labs  01/28/16 1353 01/29/16 0059 01/29/16 1346 01/30/16 0426 02/05/16 0933  NA 134*  --  133* 140 138  K 4.1  --  5.1 3.5 4.8  CL 101  --  101 106 104  CO2 19*  --  17* 24 23  GLUCOSE 521*  --  585* 132* 144*  BUN 14  --  _0 CALCIUM 8.7*  --  8.7*  8.6* 9.9  CREATININE 1.18* 1.04* 1.14* 0.98 1.06*  GFRNONAA 44* 51* 46* 55* 51*  GFRAA 51* 59* 53* >60 59*    LIVER FUNCTION TESTS:  Recent Labs  11/20/15 0809 01/09/16 1732 01/26/16 1340 02/05/16 0933  BILITOT 0.56 0.9 0.7 0.8  AST 25 63* 32 41*  ALT _0 ALKPHOS 109 115 106 94  PROT 6.3* 6.9 6.5 6.8  ALBUMIN 3.5 4.1 4.1 4.1    TUMOR MARKERS: No results for input(s): AFPTM, CEA, CA199, CHROMGRNA in the last 8760 hours.  Assessment and Plan: 76 y.o. female with significant past medical history as listed below as well as previously diagnosed myeloproliferative disorder/polycythemia vera in [place], currently on Jakafi therapy. Recent laboratory evaluation has revealed leukocytosis, anemia and thrombocytosis. Request now received for CT-guided bone marrow biopsy to rule out potential leukemic transformation.Risks and benefits discussed with the patient/family including, but not limited to bleeding, infection, damage to adjacent structures or low yield requiring additional tests.All of the patient's questions were answered, patient is agreeable to proceed.Consent signed and in chart.     Thank you for this interesting consult.  I greatly enjoyed meeting AMAURI MEDELLIN and look forward to participating in their care.  A copy of this report was sent to the requesting provider on this date.  Electronically Signed: D. Rowe [name] 03/22/2016, 9:24 AM   I spent a total of 20 minutes  in face to face in clinical consultation, greater than  50% of which was counseling/coordinating care for CT guided bone marrow biopsy

## 2016-03-22 NOTE — Sedation Documentation (Signed)
Patient denies pain and is resting comfortably.  

## 2016-03-22 NOTE — Discharge Instructions (Signed)
Moderate Conscious Sedation, Adult, Care After °Refer to this sheet in the next few weeks. These instructions provide you with information on caring for yourself after your procedure. Your health care provider may also give you more specific instructions. Your treatment has been planned according to current medical practices, but problems sometimes occur. Call your health care provider if you have any problems or questions after your procedure. °WHAT TO EXPECT AFTER THE PROCEDURE  °After your procedure: °· You may feel sleepy, clumsy, and have poor balance for several hours. °· Vomiting may occur if you eat too soon after the procedure. °HOME CARE INSTRUCTIONS °· Do not participate in any activities where you could become injured for at least 24 hours. Do not: °¨ Drive. °¨ Swim. °¨ Ride a bicycle. °¨ Operate heavy machinery. °¨ Cook. °¨ Use power tools. °¨ Climb ladders. °¨ Work from a high place. °· Do not make important decisions or sign legal documents until you are improved. °· If you vomit, drink water, juice, or soup when you can drink without vomiting. Make sure you have little or no nausea before eating solid foods. °· Only take over-the-counter or prescription medicines for pain, discomfort, or fever as directed by your health care provider. °· Make sure you and your family fully understand everything about the medicines given to you, including what side effects may occur. °· You should not drink alcohol, take sleeping pills, or take medicines that cause drowsiness for at least 24 hours. °· If you smoke, do not smoke without supervision. °· If you are feeling better, you may resume normal activities 24 hours after you were sedated. °· Keep all appointments with your health care provider. °SEEK MEDICAL CARE IF: °· Your skin is pale or bluish in color. °· You continue to feel nauseous or vomit. °· Your pain is getting worse and is not helped by medicine. °· You have bleeding or swelling. °· You are still  sleepy or feeling clumsy after 24 hours. °SEEK IMMEDIATE MEDICAL CARE IF: °· You develop a rash. °· You have difficulty breathing. °· You develop any type of allergic problem. °· You have a fever. °MAKE SURE YOU: °· Understand these instructions. °· Will watch your condition. °· Will get help right away if you are not doing well or get worse. °  °This information is not intended to replace advice given to you by your health care provider. Make sure you discuss any questions you have with your health care provider. °  °Document Released: 07/25/2013 Document Revised: 10/25/2014 Document Reviewed: 07/25/2013 °Elsevier Interactive Patient Education ©2016 [employer] °Bone Marrow Aspiration and Bone Marrow Biopsy, Care After °Refer to this sheet in the next few weeks. These instructions provide you with information about caring for yourself after your procedure. Your health care provider may also give you more specific instructions. Your treatment has been planned according to current medical practices, but problems sometimes occur. Call your health care provider if you have any problems or questions after your procedure. °WHAT TO EXPECT AFTER THE PROCEDURE °After your procedure, it is common to have: °· Soreness or tenderness around the puncture site. °· Bruising. °HOME CARE INSTRUCTIONS °· Take medicines only as directed by your health care provider. °· Follow your health care provider's instructions about: °¨ Puncture site care. °¨ Bandage (dressing) changes and removal. °· Bathe and shower as directed by your health care provider. °· Check your puncture site every day for signs of infection. Watch for: °¨ Redness, swelling, or pain. °¨   Fluid, blood, or pus. °· Return to your normal activities as directed by your health care provider. °· Keep all follow-up visits as directed by your health care provider. This is important. °SEEK MEDICAL CARE IF: °· You have a fever. °· You have uncontrollable bleeding. °· You have  redness, swelling, or pain at the site of your puncture. °· You have fluid, blood, or pus coming from your puncture site. °  °This information is not intended to replace advice given to you by your health care provider. Make sure you discuss any questions you have with your health care provider. °  °Document Released: 04/23/2005 Document Revised: 02/18/2015 Document Reviewed: 09/25/2014 °Elsevier Interactive Patient Education ©2016 [employer] ° °

## 2016-03-23 ENCOUNTER — Ambulatory Visit: Payer: Medicare Other | Admitting: Family Medicine

## 2016-03-24 ENCOUNTER — Encounter: Payer: Self-pay | Admitting: Internal Medicine

## 2016-03-24 ENCOUNTER — Ambulatory Visit (INDEPENDENT_AMBULATORY_CARE_PROVIDER_SITE_OTHER): Payer: Medicare Other | Admitting: Internal Medicine

## 2016-03-24 VITALS — BP 120/62 | HR 93 | Temp 97.8°F | Resp 12 | Wt 231.0 lb

## 2016-03-24 DIAGNOSIS — Z794 Long term (current) use of insulin: Secondary | ICD-10-CM | POA: Diagnosis not present

## 2016-03-24 DIAGNOSIS — E1142 Type 2 diabetes mellitus with diabetic polyneuropathy: Secondary | ICD-10-CM | POA: Diagnosis not present

## 2016-03-24 DIAGNOSIS — E1165 Type 2 diabetes mellitus with hyperglycemia: Secondary | ICD-10-CM | POA: Diagnosis not present

## 2016-03-24 DIAGNOSIS — IMO0002 Reserved for concepts with insufficient information to code with codable children: Secondary | ICD-10-CM

## 2016-03-24 MED ORDER — INSULIN REGULAR HUMAN (CONC) 500 UNIT/ML ~~LOC~~ SOPN: PEN_INJECTOR | SUBCUTANEOUS | Status: DC

## 2016-03-24 NOTE — Progress Notes (Signed)
Patient ID: Amanda [name], female   DOB: 1941-08-18, 75 y.o.   MRN: [id number]  HPI: Amanda [name] is a 75 y.o.-year-old female, returning for f/u for DM2, dx 2008, insulin-dependent since dx, uncontrolled, with complications (DR, PN, CAD). Last visit 1.5 mo ago.  She started seeing Dr. [name] for her [place]. She has had increasing WBC count before, now started to decrease - last close to the ULN. Had a BM Bx  - results pending.  Last hemoglobin A1c was: 02/11/2016: HbA1c from  fructosamine is 7.5% Lab Results  Component Value Date   HGBA1C 9.6 10/28/2015   HGBA1C 8.8* 01/27/2015   HGBA1C 9.8* 05/16/2014  07/01/2015: HbA1c from fructosamine 6.57%. 01/27/2015: HbA1c from fructosamine: 6.57% 05/16/2014: HbA1c from fructosamine: 7.95%. - in 10/16/2012, hemoglobin A1c was 6.9% - in 07/2012, hemoglobin A1c was 9.9% - in 11/16/2010, hemoglobin A1c was 12.8% - in 03/2011, hemoglobin A1c was 6.9% - In 07/10/2010 her hemoglobin A1c was 10.5% C-peptide was 6.3 and 01/2011.  Pt was on a regimen of: - Levemir 45 units 2x a day, in am and at bedtime - Humalog mealtime: 40 units with a smaller meal 45 units with a larger meal  - Sliding Scale of Humalog:  - 150- 165: + 1 unit  - 166- 180: + 2 units  - 181- 195: + 3 units  - 196- 210: + 4 units  - 211- 225: + 5 units  - 226- 240: + 6 units  - >240: + 7 units  She could not tolerate doses of metformin XR higher than 250 mg twice a day in the past due to diarrhea. She does not want to resume this. Was previously on the Victoza 1.8 mg daily - "made me sick". She was previously on Janumet between 2012-2013. She was on Invokana - but could not afford it while in the Medicare doughnut hole.  We switched to U500: - 15 units - on the syringe (0.15 mL) before breakfast >> 20 units >> pens (100 units) - 18 units - on the syringe (0.18 mL) before dinner >> 24 units >> pens (85 units) - Invokana 100 mg in am - added 01/2016  She checks sugars 2-3x  a day - better: - am: 190-255 >> 209-347 >> same >> 90-211, 235 >> 96, 128-206, 291 >> 170-200 >> 118-227, 276, 478 >> 110-221 - 2h after b'fast: n/c >> 238 >> n/c >> 167 - before lunch: 167-343 >> 175-340 >> 238 >> n/c >> 128-235, 418, 499 >> 69, 90-198, 250 - 2h after lunch: 240-312 >> n/c >> 270-456 >> 112-218 >> n/c >> 108-447 >> 171-175 - before dinner: 175-336 >> 147, 273 >> 58, 66, 89-157, 203, 224 (318 - Holiday) >> 150-160 >> 162-386, 592 >> 75-192 - bedtime: 152-306 >> n/c >> 161-326 >> 164, 240, 240, 268 >> 273 >> n/c No lows. Lowest sugar was 90x1 >> 70x1 >> 70-80s at 4 pm >> 58 >> 100  >> 108 >> 69x1; she has hypoglycemia awareness at 100. Highest 300x1 >> 592 (after cake) >> 400s now when sick.  She was seen for nutritional advice and diabetes education, with last visit on 04/2012 - [name].  - no CKD, last BUN/creatinine:  Lab Results  Component Value Date   BUN 17 03/22/2016   CREATININE 0.93 03/22/2016  She is on ACEI.  - last set of lipids - per PCP - no records available. Last Lipid panel in Epic: Lab Results  Component Value Date  CHOL 168 01/27/2015   HDL 27* 01/27/2015   LDLCALC 88 01/27/2015   TRIG 266* 01/27/2015   CHOLHDL 6.2 01/27/2015  She is on Pravastatin. - last eye exam was in 07/2013. 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 At last eye exam (Dr Zigmund Daniel): cataracts. She does not have money for the copay now. - no numbness and tingling in her feet. For her peripheral neuropathy >> saw PCP, podiatrist, orthopedic >> tried capsaicin cream, compression stockings, then Neurontin >> on 600 mg 3x a day. She is on aspirin 81 mg daily.  She also has a history of hypertension, diastolic dysfunction, hyperlipidemia, polycytemia vera, gout, psoriasis, depression, history of Bell's palsy, obesity, vitamin D deficiency.  On 01/27/2015 she had an AMI >> had 2 stents placed. She had very erratic sugars in the hospital.  ROS: Constitutional: no  weight gain, no fatigue Eyes: no blurry vision, no xerophthalmia ENT: no sore throat, no nodules palpated in throat, no dysphagia/odynophagia, no hoarseness Cardiovascular: no CP/palpitations/ leg swelling Respiratory:no cough/+ SOB Gastrointestinal: no N/V/D/C Musculoskeletal: no muscle/joint aches Skin: No rashes Neurological: no tremors/numbness/tingling/dizziness  I reviewed pt's medications, allergies, PMH, social hx, family hx, and changes were documented in the history of present illness. Otherwise, unchanged from my initial visit note.  PE: BP 120/62 mmHg  Pulse 93  Temp(Src) 97.8 F (36.6 C) (Oral)  Resp 12  Wt 231 lb (104.781 kg)  SpO2 96% Body mass index is 39.63 kg/(m^2). Wt Readings from Last 3 Encounters:  03/24/16 231 lb (104.781 kg)  03/22/16 230 lb 6.4 oz (104.509 kg)  03/19/16 231 lb 3.2 oz (104.872 kg)   Constitutional: overweight, in NAD Eyes: PERRLA, EOMI, no exophthalmos ENT: moist mucous membranes, no thyromegaly, no cervical lymphadenopathy Cardiovascular: RRR, No MRG, no leg swelling Respiratory: CTA B Gastrointestinal: abdomen soft, NT, ND, BS+ Musculoskeletal: no deformities, strength intact in all 4 Skin: moist, warm  ASSESSMENT: 1. 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 Diabetic peripheral neuropathy  PLAN:  1. Patient with long-standing, uncontrolled diabetes, on U500 insulin, with high CBGs, especially if misses the U500 dose. Strongly advised not to miss any doses. We also added Invokana at last visit >> sugars much better!!Reviewed BUN/Cr and potassium from 2 days ago >> normal. - Last fructosamine reviewed >> calculated HbA1c 6.57%, stable. - I advised her to: Patient Instructions  Please continue: - U500 100 units before b'fast and 85 units before dinner. - Invokana 100 mg before b'fast.  Please return in 2 months with your sugar log.   - check 3 times a day,  rotating checks  - needs a new eye exam (Dr. Zigmund Daniel)  >> again advised to schedule, but she tells me she does not have the money for the copay - will check a  Fructosamine - The HbA1c is unreliable and should not be checked. - had flu vaccine this season - Return to clinic in 2 months with sugar log  2. Diabetic peripheral neuropathy - on Neurontin to 600 mg 3 times a day - on neuropathy cream: mix Amitriptyline 2%, Lidocaine 2%, and Ketamine 1% - apply on feet 2x a day - improved after her stents, but still walks with difficulty

## 2016-03-24 NOTE — Patient Instructions (Signed)
Please continue: - U500 100 units before b'fast and 85 units before dinner. - Invokana 100 mg before b'fast.  Please return in 2 months with your sugar log.

## 2016-03-29 ENCOUNTER — Ambulatory Visit (HOSPITAL_BASED_OUTPATIENT_CLINIC_OR_DEPARTMENT_OTHER)
Admission: RE | Admit: 2016-03-29 | Discharge: 2016-03-29 | Disposition: A | Discharge status: Home / Self Care | Payer: Medicare Other | Source: Ambulatory Visit | Attending: Hematology & Oncology | Admitting: Hematology & Oncology

## 2016-03-29 ENCOUNTER — Ambulatory Visit (HOSPITAL_BASED_OUTPATIENT_CLINIC_OR_DEPARTMENT_OTHER)
Admission: RE | Admit: 2016-03-29 | Discharge: 2016-03-29 | Disposition: A | Discharge status: Home / Self Care | Payer: Medicare Other | Source: Ambulatory Visit | Attending: Family Medicine | Admitting: Family Medicine

## 2016-03-29 DIAGNOSIS — D45 Polycythemia vera: Secondary | ICD-10-CM | POA: Diagnosis not present

## 2016-03-29 DIAGNOSIS — Z13820 Encounter for screening for osteoporosis: Secondary | ICD-10-CM | POA: Insufficient documentation

## 2016-03-29 DIAGNOSIS — Z780 Asymptomatic menopausal state: Secondary | ICD-10-CM | POA: Insufficient documentation

## 2016-03-29 DIAGNOSIS — R161 Splenomegaly, not elsewhere classified: Secondary | ICD-10-CM | POA: Insufficient documentation

## 2016-03-29 DIAGNOSIS — E2839 Other primary ovarian failure: Secondary | ICD-10-CM | POA: Insufficient documentation

## 2016-03-29 DIAGNOSIS — Z87891 Personal history of nicotine dependence: Secondary | ICD-10-CM | POA: Insufficient documentation

## 2016-03-29 DIAGNOSIS — D72829 Elevated white blood cell count, unspecified: Secondary | ICD-10-CM | POA: Insufficient documentation

## 2016-03-29 DIAGNOSIS — E119 Type 2 diabetes mellitus without complications: Secondary | ICD-10-CM | POA: Diagnosis not present

## 2016-03-29 DIAGNOSIS — M85821 Other specified disorders of bone density and structure, right upper arm: Secondary | ICD-10-CM | POA: Insufficient documentation

## 2016-03-29 DIAGNOSIS — Z794 Long term (current) use of insulin: Secondary | ICD-10-CM | POA: Diagnosis not present

## 2016-03-29 DIAGNOSIS — M8589 Other specified disorders of bone density and structure, multiple sites: Secondary | ICD-10-CM | POA: Diagnosis not present

## 2016-03-30 ENCOUNTER — Encounter: Payer: Self-pay | Admitting: Family Medicine

## 2016-03-30 ENCOUNTER — Ambulatory Visit (INDEPENDENT_AMBULATORY_CARE_PROVIDER_SITE_OTHER): Payer: Medicare Other | Admitting: Family Medicine

## 2016-03-30 VITALS — BP 140/54 | HR 85 | Wt 231.0 lb

## 2016-03-30 DIAGNOSIS — R0600 Dyspnea, unspecified: Secondary | ICD-10-CM | POA: Diagnosis not present

## 2016-03-30 DIAGNOSIS — I77819 Aortic ectasia, unspecified site: Secondary | ICD-10-CM | POA: Diagnosis not present

## 2016-03-30 DIAGNOSIS — K7460 Unspecified cirrhosis of liver: Secondary | ICD-10-CM

## 2016-03-30 DIAGNOSIS — D72829 Elevated white blood cell count, unspecified: Secondary | ICD-10-CM | POA: Diagnosis not present

## 2016-03-30 NOTE — Progress Notes (Signed)
Subjective:    CC:  HPI:  Cirrhosis - Stable.  No swelling.    Diabetes-she follows with Dr. Manuela Neptune and recently had an appointment about a week ago. No medication changes were made..  Leukocytosis and polycythemia vera-she just recently had a bone marrow biopsy and is following up next week with Dr. [name] to review the results. She says the procedure was really quite painful. Her most recent white blood cell count had dropped to 13,000 from 30,000 previously.   They have also recommended that she schedule a sleep study because of her increased shortness of breath. Still just get easily winded with any type of activity. She says she has not needed to use her rescue inhaler. She does occasionally have a productive cough. Though she says she really hasn't coughed in the last 4 days. No fevers chills or sweats.   Past medical history, Surgical history, Family history not pertinant except as noted below, Social history, Allergies, and medications have been entered into the medical record, reviewed, and corrections made.   Review of Systems: No fevers, chills, night sweats, weight loss, chest pain, or shortness of breath.   Objective:    General: Well Developed, well nourished, and in no acute distress.  Neuro: Alert and oriented x3, extra-ocular muscles intact, sensation grossly intact.  HEENT: Normocephalic, atraumatic  Skin: Warm and dry, no rashes. Cardiac: Regular rate and rhythm, no murmurs rubs or gallops, no lower extremity edema.  Respiratory:  Not using accessory muscles, speaking in full sentences. She does have some rhonchi at the bases bilatearlly   Impression and Recommendations:   Cirrhosis, cryptogenic- Sable.  Recent swelling or exacerbation of symptoms.  Lung nodule-7 x 6 mm in the superior segment of the right lower lobe. They recommend CT without contrast at 6-12 months  Encouraged her  to keep appointment for sleep study. I think this is important.  Aortic  dilatation-she did have prominence of the ascending thoracic aorta noted on scan a proximally 4.1 x 3.4 cm and they recommend a follow-up with CTA or MRA in one year.   Shortness of breath-I think her symptoms are multifactorial. She is obese and in fact almost morbidly obese with a BMI of 39.6. She is very interactive to begin with. I did encourage her to try to come back in next month for spirometry. She says she has not used her rescue inhaler. She says usually she just sits down for a minute and is able to "catch" her breath. Monitor for increased cough or sputum production.  DM- followed by endocrinology.

## 2016-04-01 LAB — CHROMOSOME ANALYSIS, BONE MARROW

## 2016-04-05 ENCOUNTER — Other Ambulatory Visit: Payer: Self-pay | Admitting: *Deleted

## 2016-04-05 ENCOUNTER — Encounter: Payer: Self-pay | Admitting: Hematology & Oncology

## 2016-04-05 ENCOUNTER — Ambulatory Visit (HOSPITAL_BASED_OUTPATIENT_CLINIC_OR_DEPARTMENT_OTHER): Payer: Medicare Other | Admitting: Hematology & Oncology

## 2016-04-05 ENCOUNTER — Other Ambulatory Visit (HOSPITAL_BASED_OUTPATIENT_CLINIC_OR_DEPARTMENT_OTHER): Payer: Medicare Other

## 2016-04-05 VITALS — BP 142/65 | HR 105 | Temp 98.6°F | Resp 16 | Ht 64.0 in | Wt 230.0 lb

## 2016-04-05 DIAGNOSIS — D45 Polycythemia vera: Secondary | ICD-10-CM | POA: Diagnosis not present

## 2016-04-05 DIAGNOSIS — D751 Secondary polycythemia: Secondary | ICD-10-CM | POA: Diagnosis not present

## 2016-04-05 DIAGNOSIS — D72829 Elevated white blood cell count, unspecified: Secondary | ICD-10-CM | POA: Diagnosis not present

## 2016-04-05 LAB — COMPREHENSIVE METABOLIC PANEL (CC13)
ALBUMIN: 4.1 g/dL (ref 3.5–4.8)
ALT: 14 IU/L (ref 0–32)
AST (SGOT): 28 IU/L (ref 0–40)
Albumin/Globulin Ratio: 1.5 (ref 1.2–2.2)
Alkaline Phosphatase, S: 82 IU/L (ref 39–117)
BILIRUBIN TOTAL: 0.5 mg/dL (ref 0.0–1.2)
BUN / CREAT RATIO: 16 (ref 12–28)
BUN: 16 mg/dL (ref 8–27)
CHLORIDE: 102 mmol/L (ref 96–106)
CREATININE: 0.99 mg/dL (ref 0.57–1.00)
Calcium, Ser: 8.8 mg/dL (ref 8.7–10.3)
Carbon Dioxide, Total: 21 mmol/L (ref 18–29)
GFR calc non Af Amer: 56 mL/min/{1.73_m2} — ABNORMAL LOW (ref >59)
GFR, EST AFRICAN AMERICAN: 64 mL/min/{1.73_m2} (ref >59)
GLUCOSE: 344 mg/dL — AB (ref 65–99)
Globulin, Total: 2.8 g/dL (ref 1.5–4.5)
Potassium, Ser: 4.4 mmol/L (ref 3.5–5.2)
Sodium: 134 mmol/L (ref 134–144)
TOTAL PROTEIN: 6.9 g/dL (ref 6.0–8.5)

## 2016-04-05 LAB — CBC WITH DIFFERENTIAL (CANCER CENTER ONLY)
BASO#: 0.3 10*3/uL — AB (ref 0.0–0.2)
BASO%: 1.8 % (ref 0.0–2.0)
EOS ABS: 0.3 10*3/uL (ref 0.0–0.5)
EOS%: 2.1 % (ref 0.0–7.0)
HEMATOCRIT: 34.4 % — AB (ref 34.8–46.6)
HEMOGLOBIN: 11.2 g/dL — AB (ref 11.6–15.9)
LYMPH#: 1.4 10*3/uL (ref 0.9–3.3)
LYMPH%: 9 % — ABNORMAL LOW (ref 14.0–48.0)
MCH: 29.9 pg (ref 26.0–34.0)
MCHC: 32.6 g/dL (ref 32.0–36.0)
MCV: 92 fL (ref 81–101)
MONO#: 1.3 10*3/uL — AB (ref 0.1–0.9)
MONO%: 8.6 % (ref 0.0–13.0)
NEUT%: 78.5 % (ref 39.6–80.0)
NEUTROS ABS: 12 10*3/uL — AB (ref 1.5–6.5)
Platelets: 337 10*3/uL (ref 145–400)
RBC: 3.75 10*6/uL (ref 3.70–5.32)
RDW: 17.2 % — ABNORMAL HIGH (ref 11.1–15.7)
WBC: 15.3 10*3/uL — ABNORMAL HIGH (ref 3.9–10.0)

## 2016-04-05 LAB — TECHNOLOGIST REVIEW CHCC SATELLITE

## 2016-04-05 LAB — CHCC SATELLITE - SMEAR

## 2016-04-05 NOTE — Progress Notes (Signed)
Hematology and Oncology Follow Up Visit  OCTIVIA CANION [id number] 25-Jan-1941 75 y.o. 04/05/2016   Principle Diagnosis:   [name]-JAK2 (+)  Current Therapy:    Jakafi 3m po BID     Interim History:  Ms. KSpringis back for follow-up. We really able to reevaluate her. Since we first saw her, was not sure exactly what was going on.  She had a bone marrow biopsy done. This was done on June 5. The bone marrow report (([id number] showed a myeloproliferative marrow. There is mild to focally moderate fibrosis. No leukemic cells were noted. She had minimal storage iron.  She was positive for the JAK2 mutation.  She had a ultrasound done. This showed decrease in her splenomegaly.  Overall, she seems be doing fairly well. She has had problems with her blood sugars. She has had problems with high blood pressure.  She has had no bleeding. She has had no change in bowel or bladder habits. She has had no nausea or vomiting. She has had no joint issues.  Overall, her performance status is ECOG 1.  Medications:  Current outpatient prescriptions:  .  amLODipine (NORVASC) 5 MG tablet, Take 1 tablet (5 mg total) by mouth daily., Disp: 30 tablet, Rfl: 1 .  canagliflozin (INVOKANA) 100 MG TABS tablet, Take 1 tablet (100 mg total) by mouth daily., Disp: 30 tablet, Rfl: 5 .  dicyclomine (BENTYL) 10 MG capsule, Take 1 tab by mouth every morning, may take twice daily as needed., Disp: 60 capsule, Rfl: 8 .  furosemide (LASIX) 20 MG tablet, Take 1 tablet (20 mg total) by mouth daily., Disp: 20 tablet, Rfl: 0 .  hydrochlorothiazide (HYDRODIURIL) 25 MG tablet, , Disp: , Rfl:  .  insulin regular human CONCENTRATED (HUMULIN R U-500 KWIKPEN) 500 UNIT/ML kwikpen, Inject 100 units under the skin before breakfast and 85 units before dinner., Disp: 18 pen, Rfl: 1 .  loperamide (IMODIUM A-D) 2 MG tablet, Take 2 mg by mouth daily as needed for diarrhea or loose stools. , Disp: , Rfl:  .  losartan (COZAAR)  100 MG tablet, Take 1 tablet (100 mg total) by mouth daily., Disp: 90 tablet, Rfl: 1 .  nitroGLYCERIN (NITROSTAT) 0.4 MG SL tablet, Place 1 tablet (0.4 mg total) under the tongue every 5 (five) minutes as needed for chest pain., Disp: 25 tablet, Rfl: 3 .  NONFORMULARY OR COMPOUNDED ITEM, Neuropathy cream: mix Amitriptyline 2%, Lidocaine 2%, and Ketamine 1% - apply on feet 2x a day [facility]., Disp: 1 each, Rfl: 2 .  omega-3 acid ethyl esters (LOVAZA) 1 G capsule, Take 1 capsule (1 g total) by mouth 2 (two) times daily., Disp: 60 capsule, Rfl: 11 .  ONETOUCH VERIO test strip, Use to test blood sugar 3 times daily., Disp: , Rfl:  .  rosuvastatin (CRESTOR) 20 MG tablet, Take 1 tablet (20 mg total) by mouth daily., Disp: 90 tablet, Rfl: 3 .  ruxolitinib phosphate (JAKAFI) 25 MG tablet, Take 1 tablet (25 mg total) by mouth 2 (two) times daily. (Patient taking differently: Take 20 mg by mouth 2 (two) times daily. ), Disp: 60 tablet, Rfl: 3 .  ticagrelor (BRILINTA) 60 MG TABS tablet, Take 1 tablet (60 mg total) by mouth 2 (two) times daily., Disp: 28 tablet, Rfl: 0  Allergies:  Allergies  Allergen Reactions  . Lisinopril Cough  . Tape Hives  . Doxycycline Hives, Swelling and Rash  . Latex Hives, Itching and Rash    Past Medical History,  Surgical history, Social history, and Family History were reviewed and updated.  Review of Systems: As above  Physical Exam:  height is _0  (1.626 m) and weight is 230 lb (104.327 kg). Her oral temperature is 98.6 F (37 C). Her blood pressure is 142/65 and her pulse is 105. Her respiration is 16.   Wt Readings from Last 3 Encounters:  04/05/16 230 lb (104.327 kg)  03/30/16 231 lb (104.781 kg)  03/24/16 231 lb (104.781 kg)     Well-developed and well-nourished white female who is moderately obese. Head exam shows no ocular or oral lesions. There are no palpable cervical or supraclavicular lymph nodes. She has no palpable thyroid. Lungs are  clear bilaterally. Cardiac exam regular rate and rhythm with no murmurs, rubs or bruits. Abdomen is soft. She has good bowel sounds. She is obese. She has no fluid wave. There is no palpable liver or spleen tip. Back exam shows no tenderness over the spine, ribs or hips. Extremity shows no clubbing, cyanosis or edema. Skin exam shows no rashes, ecchymoses or petechia.  Lab Results  Component Value Date   WBC 15.3* 04/05/2016   HGB 11.2* 04/05/2016   HCT 34.4* 04/05/2016   MCV 92 04/05/2016   PLT 337 04/05/2016     Chemistry      Component Value Date/Time   NA 137 03/22/2016 0940   NA 132* 11/20/2015 0809   K 4.5 03/22/2016 0940   K 4.1 11/20/2015 0809   CL 108 03/22/2016 0940   CL 103 02/16/2013 0900   CO2 22 03/22/2016 0940   CO2 19* 11/20/2015 0809   BUN 17 03/22/2016 0940   BUN 21.3 11/20/2015 0809   CREATININE 0.93 03/22/2016 0940   CREATININE 1.06* 02/05/2016 0933   CREATININE 2.8* 11/20/2015 0809      Component Value Date/Time   CALCIUM 8.7* 03/22/2016 0940   CALCIUM 8.5 11/20/2015 0809   ALKPHOS 94 02/05/2016 0933   ALKPHOS 109 11/20/2015 0809   AST 41* 02/05/2016 0933   AST 25 11/20/2015 0809   ALT 24 02/05/2016 0933   ALT 14 11/20/2015 0809   BILITOT 0.8 02/05/2016 0933   BILITOT 0.56 11/20/2015 0809         Impression and Plan: Ms. Cawley is A 75 year old white female. She has a polycythemia vera. She is JAK2 positive.  The JAKAFI is really doing a good job in maintaining her blood counts. She does not need to be phlebotomized.  It is hard to say what the future will hold. I think that she probably will do okay. She is tolerating the JAKAFI quite nicely.  She does not be phlebotomized I told her because her blood count just is not high enough.  She is on a platelet inhibitor-Brilinta-so she does not need aspirin.  I'll plan to get her back in 2 months. I think this would be quite reasonable.  I spent about 30 minutes with she and her husband. I  reviewed the bone marrow results. I reviewed the JAK2 assay.    Volanda Napoleon, MD 6/19/20174:34 PM

## 2016-04-06 LAB — IRON AND TIBC
%SAT: 26 % (ref 21–57)
Iron: 77 ug/dL (ref 41–142)
TIBC: 299 ug/dL (ref 236–444)
UIBC: 222 ug/dL (ref 120–384)

## 2016-04-06 LAB — RETICULOCYTES: RETICULOCYTE COUNT: 2.3 % (ref 0.6–2.6)

## 2016-04-06 LAB — FERRITIN: Ferritin: 120 ng/ml (ref 9–269)

## 2016-04-06 LAB — LACTATE DEHYDROGENASE: LDH: 296 U/L — ABNORMAL HIGH (ref 125–245)

## 2016-04-07 ENCOUNTER — Encounter (HOSPITAL_COMMUNITY): Payer: Self-pay

## 2016-04-07 ENCOUNTER — Other Ambulatory Visit: Payer: Self-pay | Admitting: *Deleted

## 2016-04-07 ENCOUNTER — Encounter: Payer: Self-pay | Admitting: *Deleted

## 2016-04-07 ENCOUNTER — Encounter: Payer: Self-pay | Admitting: Family Medicine

## 2016-04-07 DIAGNOSIS — M8580 Other specified disorders of bone density and structure, unspecified site: Secondary | ICD-10-CM | POA: Insufficient documentation

## 2016-04-07 MED ORDER — VITAMIN D 1000 UNITS PO TABS: 1000.0000 [IU] | ORAL_TABLET | Freq: Every day | ORAL | Status: DC

## 2016-04-07 MED ORDER — CALCIUM 600 MG PO TABS: 1200.0000 mg | ORAL_TABLET | Freq: Every day | ORAL | Status: DC

## 2016-04-18 ENCOUNTER — Ambulatory Visit (HOSPITAL_BASED_OUTPATIENT_CLINIC_OR_DEPARTMENT_OTHER): Payer: Medicare Other | Attending: Cardiovascular Disease | Admitting: Cardiovascular Disease

## 2016-04-18 VITALS — Ht 64.0 in | Wt 230.0 lb

## 2016-04-18 DIAGNOSIS — G4719 Other hypersomnia: Secondary | ICD-10-CM | POA: Insufficient documentation

## 2016-04-18 DIAGNOSIS — G4710 Hypersomnia, unspecified: Secondary | ICD-10-CM

## 2016-04-18 DIAGNOSIS — Z79899 Other long term (current) drug therapy: Secondary | ICD-10-CM | POA: Insufficient documentation

## 2016-04-18 DIAGNOSIS — G4733 Obstructive sleep apnea (adult) (pediatric): Secondary | ICD-10-CM | POA: Diagnosis not present

## 2016-04-18 DIAGNOSIS — R400 Somnolence: Secondary | ICD-10-CM

## 2016-04-18 DIAGNOSIS — I2510 Atherosclerotic heart disease of native coronary artery without angina pectoris: Secondary | ICD-10-CM | POA: Diagnosis not present

## 2016-04-18 DIAGNOSIS — I493 Ventricular premature depolarization: Secondary | ICD-10-CM | POA: Insufficient documentation

## 2016-04-18 DIAGNOSIS — R0683 Snoring: Secondary | ICD-10-CM | POA: Insufficient documentation

## 2016-04-18 DIAGNOSIS — Z794 Long term (current) use of insulin: Secondary | ICD-10-CM | POA: Diagnosis not present

## 2016-04-18 DIAGNOSIS — G4736 Sleep related hypoventilation in conditions classified elsewhere: Secondary | ICD-10-CM | POA: Diagnosis not present

## 2016-04-20 NOTE — Procedures (Signed)
Patient Name: Amanda [name], Amanda [name] Date: 04/18/2016 Gender: Female D.O.B: 1941/03/06 Age (years): 33 Referring Provider: Shelva Majestic MD, ABSM Height (inches): 64 Interpreting Physician: Shelva Majestic MD, ABSM Weight (lbs): 230 RPSGT: Joni Reining BMI: 39 MRN: [id number] Neck Size: 15.50  CLINICAL INFORMATION Sleep Study Type: NPSG Indication for sleep study: Excessive Daytime Sleepiness, Snoring Epworth Sleepiness Score: 15  SLEEP STUDY TECHNIQUE As per the AASM Manual for the Scoring of Sleep and Associated Events v2.3 (April 2016) with a hypopnea requiring 4% desaturations. The channels recorded and monitored were frontal, central and occipital EEG, electrooculogram (EOG), submentalis EMG (chin), nasal and oral airflow, thoracic and abdominal wall motion, anterior tibialis EMG, snore microphone, electrocardiogram, and pulse oximetry.  MEDICATIONS  ticagrelor (BRILINTA) 60 MG TABS tablet 60 mg, 2 times daily     ruxolitinib phosphate (JAKAFI) 25 MG tablet 25 mg, 2 times daily     Patient taking differently: 20 mg Oral 2 times daily, Indications: Myelofibrosis, [name], Reason: Free Text Sig Edit, Informant: Self, Reported on 01/30/2016   rosuvastatin (CRESTOR) 20 MG tablet 20 mg, Daily     ONETOUCH VERIO test strip      Note: Received from: External Pharmacy (Written 02/05/2016 0800)   omega-3 acid ethyl esters (LOVAZA) 1 G capsule 1 g, 2 times daily     NONFORMULARY OR COMPOUNDED ITEM      nitroGLYCERIN (NITROSTAT) 0.4 MG SL tablet 0.4 mg, Every 5 min PRN     losartan (COZAAR) 100 MG tablet 100 mg, Daily     loperamide (IMODIUM A-D) 2 MG tablet 2 mg, Daily PRN     insulin regular human CONCENTRATED (HUMULIN R U-500 KWIKPEN) 500 UNIT/ML kwikpen     hydrochlorothiazide (HYDRODIURIL) 25 MG tablet      Note: Received from: External Pharmacy (Written 02/05/2016 0800)   furosemide (LASIX) 20 MG tablet 20 mg, Daily     dicyclomine (BENTYL) 10 MG capsule      cholecalciferol (VITAMIN D) 1000 units tablet 1,000 Units, Daily     canagliflozin (INVOKANA) 100 MG TABS tablet 100 mg, Daily     calcium carbonate (OS-CAL) 600 MG tablet 1,200 mg, Daily     amLODipine (NORVASC) 5 MG tablet 5 mg, Daily   Medications self-administered by patient during sleep study : No sleep medicine administered.  SLEEP ARCHITECTURE The study was initiated at 10:02:44 PM and ended at 4:41:21 AM. Sleep onset time was 68.5 minutes and the sleep efficiency was 45.6%. The total sleep time was 181.7 minutes. Wake after sleep onset (WASO) was 148.5 minutes. Stage REM latency was 307.0 minutes. The patient spent 19.82% of the night in stage N1 sleep, 67.43% in stage N2 sleep, 0.00% in stage N3 and 12.75% in REM. Alpha intrusion was absent. Supine sleep was 0.00%.  RESPIRATORY PARAMETERS The overall apnea/hypopnea index (AHI) was 27.4 per hour. There were 9 total apneas, including 9 obstructive, 0 central and 0 mixed apneas. There were 74 hypopneas and 31 RERAs. The AHI during Stage REM sleep was 59.6 per hour. AHI while supine was N/A per hour. The mean oxygen saturation was 91.96%. The minimum SpO2 during sleep was 73.00%. Moderate snoring was noted during this study.  CARDIAC DATA The 2 lead EKG demonstrated sinus rhythm. The mean heart rate was 80.36 beats per minute. Other EKG findings include: Rare isolated PVC.  LEG MOVEMENT DATA The total PLMS were 4 with a resulting PLMS index of 1.32. Associated arousal with leg movement index was 0.0 .  IMPRESSIONS - Moderate obstructive sleep apnea overall with an AHI of 27.4/h; however, sleep apnea was severe during in REM sleep with an AHI of 59.6/h. - No significant central sleep apnea occurred during this study (CAI=0). - Severe oxygen desaturation to a nadir of 73.00% during REM sleep. - Reduced sleep efficiency. - The patient snored with Moderate snoring volume. - The patient was in sinus rhythm.  There was a rare  isolated PVC. - Clinically significant periodic limb movements did not occur during sleep. No significant associated arousals.  DIAGNOSIS - Obstructive Sleep Apnea (327.23 [G47.33 ICD-10]) - Nocturnal Hypoxemia (327.26 [G47.36 ICD-10])  RECOMMENDATIONS - Recommend therapeutic CPAP titration to determine optimal pressure required to alleviate sleep disordered breathing in this symptomatic patient with significant cardiovascular comorbidities. - Efforts should be made to optimize nasal and oral pharyngeal patency. - Avoid alcohol, sedatives and other CNS depressants that may worsen sleep apnea and disrupt normal sleep architecture. - Sleep hygiene should be reviewed to assess factors that may improve sleep quality. - Weight management and regular exercise should be initiated or continued if appropriate.   [name], MD, [place], American Board of Sleep Medicine  ELECTRONICALLY SIGNED ON:  04/20/2016, 3:01 PM [place] PH: [phone]   FX: [phone] [place]

## 2016-04-21 ENCOUNTER — Encounter: Payer: Self-pay | Admitting: Family Medicine

## 2016-04-21 DIAGNOSIS — G4733 Obstructive sleep apnea (adult) (pediatric): Secondary | ICD-10-CM | POA: Insufficient documentation

## 2016-04-22 ENCOUNTER — Telehealth: Payer: Self-pay | Admitting: Hematology and Oncology

## 2016-04-22 ENCOUNTER — Telehealth: Payer: Self-pay | Admitting: *Deleted

## 2016-04-22 DIAGNOSIS — G4730 Sleep apnea, unspecified: Secondary | ICD-10-CM

## 2016-04-22 NOTE — Telephone Encounter (Signed)
Message     Amanda [name] please schedule for CPAP titration study.   Spoke with pt, aware of sleep study results. Order placed for titration.

## 2016-04-22 NOTE — Telephone Encounter (Signed)
Returned call and s.w. Pt and confirmed appt cx....the patient ok and aware to call back when ready to r/s

## 2016-04-26 ENCOUNTER — Other Ambulatory Visit: Payer: Medicare Other

## 2016-04-27 ENCOUNTER — Ambulatory Visit: Payer: Medicare Other | Admitting: Hematology and Oncology

## 2016-05-09 ENCOUNTER — Ambulatory Visit (HOSPITAL_BASED_OUTPATIENT_CLINIC_OR_DEPARTMENT_OTHER): Payer: Medicare Other | Attending: Cardiovascular Disease | Admitting: Cardiovascular Disease

## 2016-05-10 NOTE — Procedures (Signed)
PATIENT DECLINED TO WEAR BOTH NASAL AND CPAP MASK DUE TO PANIC SITUATION, SHE  DECIDED TO CANCELLED THE STUDY AND WENT HOME.

## 2016-05-25 ENCOUNTER — Ambulatory Visit: Payer: Medicare Other | Admitting: Family Medicine

## 2016-05-25 ENCOUNTER — Encounter: Payer: Self-pay | Admitting: Family Medicine

## 2016-05-25 ENCOUNTER — Ambulatory Visit (INDEPENDENT_AMBULATORY_CARE_PROVIDER_SITE_OTHER): Payer: Medicare Other | Admitting: Family Medicine

## 2016-05-25 VITALS — BP 158/58 | HR 92 | Ht 64.0 in | Wt 233.0 lb

## 2016-05-25 DIAGNOSIS — R0602 Shortness of breath: Secondary | ICD-10-CM | POA: Diagnosis not present

## 2016-05-25 DIAGNOSIS — R197 Diarrhea, unspecified: Secondary | ICD-10-CM | POA: Diagnosis not present

## 2016-05-25 DIAGNOSIS — J984 Other disorders of lung: Secondary | ICD-10-CM

## 2016-05-25 MED ORDER — DIPHENOXYLATE-ATROPINE 2.5-0.025 MG PO TABS: 1.0000 | ORAL_TABLET | Freq: Two times a day (BID) | ORAL | 1 refills | Status: DC | PRN

## 2016-05-25 MED ORDER — ALBUTEROL SULFATE (2.5 MG/3ML) 0.083% IN NEBU
2.5000 mg | INHALATION_SOLUTION | Freq: Once | RESPIRATORY_TRACT | Status: AC
  Administered 2016-05-25: 2.5 mg via RESPIRATORY_TRACT

## 2016-05-25 NOTE — Progress Notes (Signed)
Patient came into clinic today for spirometry. Pt reports she currently does not smoke but she used to smoke 2 packs per day. Pt reports having some shortness of breath and a cough that "won't go away." Pt struggled to complete spirometry. Was able to get enough information for PCP. Pt placed in a room where results will be gone over in detail with PCP.

## 2016-05-25 NOTE — Progress Notes (Signed)
   Subjective:    Patient ID: Amanda [name], female    DOB: 10-03-1941, 75 y.o.   MRN: [id number]  HPI Here for spirometry today. Previous smoker. She quit 30 years ago but did smoke up to 2 packs a day at one point in time. They also had a wood stove in her home that they report that they used it infrequently and they lived up [place]. 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 Per the technician she did struggle to complete the spirometry. Her FVC was 48% FEV1 47% with a ratio of 74%. He did have a CT angiogram of the chest in April 2017. They did note a 7 x 6 mm nodular opacity in the right lower lobe superior segment. They have recommended follow-up noncontrast CT at 6-12 months. X-ray also showed chronic bronchitic changes. Cough is more dry.    She also complains of persistent diarrhea that has been going on for a couple of years. It has been worse over the last 5 days.. She reports that she has had stool cultures and C. difficile testing in the past and has been negative. But for some reason it actually been worse the last 5 days. She did try a probiotic but she felt like after couple days it actually made it worse. An old prescription for Lomotil and took 1 and says it did help and would like a new prescription. She says it has been flaring her hemorrhoids and she's been using Preparation H which does seem to help.   Review of Systems     Objective:   Physical Exam  Constitutional: She is oriented to person, place, and time. She appears well-developed and well-nourished.  HENT:  Head: Normocephalic and atraumatic.  Eyes: Conjunctivae and EOM are normal.  Cardiovascular: Normal rate.   Pulmonary/Chest: Effort normal.  Neurological: She is alert and oriented to person, place, and time.  Skin: Skin is dry. No pallor.  Psychiatric: She has a normal mood and affect. Her behavior is normal.  Vitals reviewed.         Assessment & Plan:  Spirometry- FEC  of 48%, FEV1 of 47% ratio of 74%. No significant improvement in FEV1 postbronchodilator. Discussed results with patient. They're consistent with restrictive disease. Will refer to pulmonology for further evaluation. She will need formal PFTs. Did discuss that weight loss can be helpful, as she is obese with BMI 39.  Diarrhea, chronic-unknown etiology. We'll send her prescription for Lomotil. Sure staying well-hydrated. She did have a negative C. difficile in April 2016. She was positive for C. difficile back in 2015, 3 years ago.

## 2016-05-28 ENCOUNTER — Telehealth: Payer: Self-pay

## 2016-05-28 MED ORDER — LOPERAMIDE HCL 2 MG PO TABS: 2.0000 mg | ORAL_TABLET | Freq: Every day | ORAL | 1 refills | Status: DC | PRN

## 2016-05-28 NOTE — Telephone Encounter (Signed)
Patient advised medications was sent to the pharmacy.

## 2016-05-28 NOTE — Telephone Encounter (Signed)
Amanda [name] wants a refill on Imodium A-D. This is a historical provider.

## 2016-05-28 NOTE — Telephone Encounter (Signed)
OK to fill. 1-2 tabs po QD PRN. #30, 1 rf.

## 2016-06-02 ENCOUNTER — Ambulatory Visit: Payer: Medicare Other | Admitting: Internal Medicine

## 2016-06-02 ENCOUNTER — Other Ambulatory Visit: Payer: Self-pay | Admitting: *Deleted

## 2016-06-02 MED ORDER — RUXOLITINIB PHOSPHATE 25 MG PO TABS: 25.0000 mg | ORAL_TABLET | Freq: Two times a day (BID) | ORAL | 3 refills | Status: DC

## 2016-06-07 ENCOUNTER — Ambulatory Visit (HOSPITAL_BASED_OUTPATIENT_CLINIC_OR_DEPARTMENT_OTHER): Payer: Medicare Other | Admitting: Hematology & Oncology

## 2016-06-07 ENCOUNTER — Other Ambulatory Visit (HOSPITAL_BASED_OUTPATIENT_CLINIC_OR_DEPARTMENT_OTHER): Payer: Medicare Other

## 2016-06-07 ENCOUNTER — Encounter: Payer: Self-pay | Admitting: Emergency Medicine

## 2016-06-07 ENCOUNTER — Encounter: Payer: Self-pay | Admitting: Hematology & Oncology

## 2016-06-07 VITALS — BP 173/68 | HR 106 | Temp 97.3°F | Resp 20 | Ht 64.0 in | Wt 226.0 lb

## 2016-06-07 DIAGNOSIS — D45 Polycythemia vera: Secondary | ICD-10-CM

## 2016-06-07 LAB — CBC WITH DIFFERENTIAL (CANCER CENTER ONLY)
HEMATOCRIT: 38.8 % (ref 34.8–46.6)
HGB: 12.8 g/dL (ref 11.6–15.9)
MCH: 29 pg (ref 26.0–34.0)
MCHC: 33 g/dL (ref 32.0–36.0)
MCV: 88 fL (ref 81–101)
Platelets: 599 10*3/uL — ABNORMAL HIGH (ref 145–400)
RBC: 4.42 10*6/uL (ref 3.70–5.32)
RDW: 18.2 % — AB (ref 11.1–15.7)
WBC: 23.9 10*3/uL — ABNORMAL HIGH (ref 3.9–10.0)

## 2016-06-07 LAB — MANUAL DIFFERENTIAL (CHCC SATELLITE)
ALC: 3.1 10*3/uL — ABNORMAL HIGH (ref 0.6–2.2)
ANC (CHCC HP manual diff): 12.2 10*3/uL — ABNORMAL HIGH (ref 1.5–6.7)
BAND NEUTROPHILS: 5 % (ref 0–10)
BASO: 1 % (ref 0–2)
EOS: 0 % (ref 0–7)
LYMPH: 13 % — ABNORMAL LOW (ref 14–48)
MONO: 5 % (ref 0–13)
Metamyelocytes: 3 % — ABNORMAL HIGH (ref 0–0)
PLT EST ~~LOC~~: INCREASED
SEG: 43 % (ref 40–75)

## 2016-06-07 LAB — CMP (CANCER CENTER ONLY)
ALBUMIN: 3.6 g/dL (ref 3.3–5.5)
ALK PHOS: 90 U/L — AB (ref 26–84)
ALT: 23 U/L (ref 10–47)
AST: 50 U/L — ABNORMAL HIGH (ref 11–38)
BUN, Bld: 17 mg/dL (ref 7–22)
CALCIUM: 9.7 mg/dL (ref 8.0–10.3)
CO2: 22 meq/L (ref 18–33)
Chloride: 101 mEq/L (ref 98–108)
Creat: 1.3 mg/dl — ABNORMAL HIGH (ref 0.6–1.2)
GLUCOSE: 562 mg/dL — AB (ref 73–118)
POTASSIUM: 4.2 meq/L (ref 3.3–4.7)
Sodium: 132 mEq/L (ref 128–145)
Total Bilirubin: 1.2 mg/dl (ref 0.20–1.60)
Total Protein: 6.7 g/dL (ref 6.4–8.1)

## 2016-06-07 LAB — IRON AND TIBC
%SAT: 20 % — ABNORMAL LOW (ref 21–57)
IRON: 64 ug/dL (ref 41–142)
TIBC: 323 ug/dL (ref 236–444)
UIBC: 258 ug/dL (ref 120–384)

## 2016-06-07 LAB — FERRITIN: FERRITIN: 66 ng/mL (ref 9–269)

## 2016-06-07 LAB — LACTATE DEHYDROGENASE: LDH: 305 U/L — ABNORMAL HIGH (ref 125–245)

## 2016-06-07 NOTE — Progress Notes (Signed)
No phlebotomy needed today per Dr [name].

## 2016-06-07 NOTE — Progress Notes (Signed)
Hematology and Oncology Follow Up Visit  Amanda [name] [id number] [birth year] 75 y.o. 06/07/2016   Principle Diagnosis:   [name]-JAK2 (+)  Current Therapy:    Jakafi 5m po BID     Interim History:  Amanda [name] back for follow-up. She is not doing too well right now. She's having some breathing issues. She cannot see the pulmonologist for another 3 months.  She had a chest x-ray back in April. She has some chronic bronchitis changes. There is no pulmonary edema or infiltrates.  She is not taking JAKAFI for 1 week or so. She's has a*new make her feel nauseated. I'm not sure why this would be. We will see about getting her some Phenergan to take beforehand.  She does have a lot of other health issues. She does have diabetes. She try to watch this.  She's had no obvious bleeding or bruising. She's had no leg swelling. She's had no joint problems. e has had no joint issues.  Overall, her performance status is ECOG 2.  Medications:  Current Outpatient Prescriptions:  .  amLODipine (NORVASC) 5 MG tablet, Take 1 tablet (5 mg total) by mouth daily., Disp: 30 tablet, Rfl: 1 .  calcium carbonate (OS-CAL) 600 MG tablet, Take 2 tablets (1,200 mg total) by mouth daily., Disp: 60 tablet, Rfl:  .  canagliflozin (INVOKANA) 100 MG TABS tablet, Take 1 tablet (100 mg total) by mouth daily., Disp: 30 tablet, Rfl: 5 .  cholecalciferol (VITAMIN D) 1000 units tablet, Take 1 tablet (1,000 Units total) by mouth daily., Disp: 30 tablet, Rfl:  .  dicyclomine (BENTYL) 10 MG capsule, Take 1 tab by mouth every morning, may take twice daily as needed., Disp: 60 capsule, Rfl: 8 .  diphenoxylate-atropine (LOMOTIL) 2.5-0.025 MG tablet, Take 1-2 tablets by mouth 2 (two) times daily as needed for diarrhea or loose stools., Disp: 60 tablet, Rfl: 1 .  furosemide (LASIX) 20 MG tablet, Take 1 tablet (20 mg total) by mouth daily., Disp: 20 tablet, Rfl: 0 .  hydrochlorothiazide (HYDRODIURIL) 25 MG tablet, ,  Disp: , Rfl:  .  insulin regular human CONCENTRATED (HUMULIN R U-500 KWIKPEN) 500 UNIT/ML kwikpen, Inject 100 units under the skin before breakfast and 85 units before dinner., Disp: 18 pen, Rfl: 1 .  loperamide (IMODIUM A-D) 2 MG tablet, Take 1-2 tablets (2-4 mg total) by mouth daily as needed for diarrhea or loose stools., Disp: 30 tablet, Rfl: 1 .  losartan (COZAAR) 100 MG tablet, Take 1 tablet (100 mg total) by mouth daily., Disp: 90 tablet, Rfl: 1 .  nitroGLYCERIN (NITROSTAT) 0.4 MG SL tablet, Place 1 tablet (0.4 mg total) under the tongue every 5 (five) minutes as needed for chest pain., Disp: 25 tablet, Rfl: 3 .  NONFORMULARY OR COMPOUNDED ITEM, Neuropathy cream: mix Amitriptyline 2%, Lidocaine 2%, and Ketamine 1% - apply on feet 2x a day [facility]., Disp: 1 each, Rfl: 2 .  omega-3 acid ethyl esters (LOVAZA) 1 G capsule, Take 1 capsule (1 g total) by mouth 2 (two) times daily., Disp: 60 capsule, Rfl: 11 .  ONETOUCH VERIO test strip, Use to test blood sugar 3 times daily., Disp: , Rfl:  .  rosuvastatin (CRESTOR) 20 MG tablet, Take 1 tablet (20 mg total) by mouth daily., Disp: 90 tablet, Rfl: 3 .  ruxolitinib phosphate (JAKAFI) 25 MG tablet, Take 1 tablet (25 mg total) by mouth 2 (two) times daily., Disp: 60 tablet, Rfl: 3 .  ticagrelor (BRILINTA) 60 MG TABS  tablet, Take 1 tablet (60 mg total) by mouth 2 (two) times daily., Disp: 28 tablet, Rfl: 0  Allergies:  Allergies  Allergen Reactions  . Lisinopril Cough  . Tape Hives  . Doxycycline Hives, Swelling and Rash  . Latex Hives, Itching and Rash    Past Medical History, Surgical history, Social history, and Family History were reviewed and updated.  Review of Systems: As above  Physical Exam:  height is 5' 4"  (1.626 m) and weight is 226 lb (102.5 kg). Her oral temperature is 97.3 F (36.3 C). Her blood pressure is 173/68 (abnormal) and her pulse is 106 (abnormal). Her respiration is 20.   Wt Readings from Last 3  Encounters:  06/07/16 226 lb (102.5 kg)  05/25/16 233 lb (105.7 kg)  04/18/16 230 lb (104.3 kg)     Well-developed and well-nourished white female who is moderately obese. Head and neck exam shows no ocular or oral lesions. There are no palpable cervical or supraclavicular lymph nodes. She has no palpable thyroid. Lungs are clear bilaterally. She may have some wheezing and some crackles at the bases bilaterally. Cardiac exam regular rate and rhythm with no murmurs, rubs or bruits. Abdomen is soft. She has good bowel sounds. She is obese. She has no fluid wave. There is no palpable liver or spleen tip. Back exam shows no tenderness over the spine, ribs or hips. Extremity shows no clubbing, cyanosis or edema. Skin exam shows no rashes, ecchymoses or petechia. Neurological exam shows no focal neurological deficits. Lab Results  Component Value Date   WBC 23.9 (H) 06/07/2016   HGB 12.8 06/07/2016   HCT 38.8 06/07/2016   MCV 88 06/07/2016   PLT 599 (H) 06/07/2016     Chemistry      Component Value Date/Time   NA 134 04/05/2016 1513   NA 132 (L) 11/20/2015 0809   K 4.4 04/05/2016 1513   K 4.1 11/20/2015 0809   CL 102 04/05/2016 1513   CL 103 02/16/2013 0900   CO2 21 04/05/2016 1513   CO2 19 (L) 11/20/2015 0809   BUN 16 04/05/2016 1513   BUN 21.3 11/20/2015 0809   CREATININE 0.99 04/05/2016 1513   CREATININE 1.06 (H) 02/05/2016 0933   CREATININE 2.8 (H) 11/20/2015 0809      Component Value Date/Time   CALCIUM 8.8 04/05/2016 1513   CALCIUM 8.5 11/20/2015 0809   ALKPHOS 82 04/05/2016 1513   ALKPHOS 109 11/20/2015 0809   AST 28 04/05/2016 1513   AST 25 11/20/2015 0809   ALT 14 04/05/2016 1513   ALT 14 11/20/2015 0809   BILITOT 0.5 04/05/2016 1513   BILITOT 0.56 11/20/2015 0809         Impression and Plan: Amanda [name] is A 75 year old white female. She has a polycythemia vera. She is JAK2 positive.  lI think that her blood counts are up a little bit because she is not taking  the [place] for a week or so. [place] why it is making her sick all of a sudden.  I will get her blood under the microscope and I do not see anything that looked suspicious for any kind of transformation.  We will try her on some nausea medicine before she takes the [place]. Hopefully this will help her. I will try some Phenergan.  I will see her back in 4-6 weeks and hopefully her blood counts will be improved.   Volanda Napoleon, MD 8/21/201711:21 AM

## 2016-06-08 ENCOUNTER — Other Ambulatory Visit: Payer: Self-pay | Admitting: *Deleted

## 2016-06-08 MED ORDER — PROMETHAZINE HCL 25 MG PO TABS: 12.5000 mg | ORAL_TABLET | Freq: Four times a day (QID) | ORAL | 1 refills | Status: DC | PRN

## 2016-06-09 ENCOUNTER — Telehealth: Payer: Self-pay | Admitting: *Deleted

## 2016-06-09 NOTE — Telephone Encounter (Signed)
Called [facility] for status of Jakafi.  It was shipped out to patient today.

## 2016-06-24 ENCOUNTER — Emergency Department (HOSPITAL_COMMUNITY)
Admission: EM | Admit: 2016-06-24 | Discharge: 2016-06-24 | Disposition: A | Discharge status: Home / Self Care | Payer: Medicare Other | Attending: Emergency Medicine | Admitting: Emergency Medicine

## 2016-06-24 ENCOUNTER — Encounter (HOSPITAL_COMMUNITY): Payer: Self-pay | Admitting: Emergency Medicine

## 2016-06-24 DIAGNOSIS — E1140 Type 2 diabetes mellitus with diabetic neuropathy, unspecified: Secondary | ICD-10-CM | POA: Diagnosis not present

## 2016-06-24 DIAGNOSIS — I110 Hypertensive heart disease with heart failure: Secondary | ICD-10-CM | POA: Diagnosis not present

## 2016-06-24 DIAGNOSIS — R197 Diarrhea, unspecified: Secondary | ICD-10-CM | POA: Insufficient documentation

## 2016-06-24 DIAGNOSIS — I2510 Atherosclerotic heart disease of native coronary artery without angina pectoris: Secondary | ICD-10-CM | POA: Insufficient documentation

## 2016-06-24 DIAGNOSIS — Z85828 Personal history of other malignant neoplasm of skin: Secondary | ICD-10-CM | POA: Diagnosis not present

## 2016-06-24 DIAGNOSIS — Z91040 Latex allergy status: Secondary | ICD-10-CM | POA: Diagnosis not present

## 2016-06-24 DIAGNOSIS — Z87891 Personal history of nicotine dependence: Secondary | ICD-10-CM | POA: Insufficient documentation

## 2016-06-24 DIAGNOSIS — Z794 Long term (current) use of insulin: Secondary | ICD-10-CM | POA: Diagnosis not present

## 2016-06-24 DIAGNOSIS — I252 Old myocardial infarction: Secondary | ICD-10-CM | POA: Diagnosis not present

## 2016-06-24 DIAGNOSIS — Z79899 Other long term (current) drug therapy: Secondary | ICD-10-CM | POA: Diagnosis not present

## 2016-06-24 DIAGNOSIS — I5031 Acute diastolic (congestive) heart failure: Secondary | ICD-10-CM | POA: Diagnosis not present

## 2016-06-24 LAB — CBC WITH DIFFERENTIAL/PLATELET
BASOS ABS: 0 10*3/uL (ref 0.0–0.1)
Basophils Relative: 0 %
EOS ABS: 0.6 10*3/uL (ref 0.0–0.7)
Eosinophils Relative: 2 %
HCT: 40.1 % (ref 36.0–46.0)
HEMOGLOBIN: 12.9 g/dL (ref 12.0–15.0)
LYMPHS ABS: 2.7 10*3/uL (ref 0.7–4.0)
LYMPHS PCT: 9 %
MCH: 28 pg (ref 26.0–34.0)
MCHC: 32.2 g/dL (ref 30.0–36.0)
MCV: 87.2 fL (ref 78.0–100.0)
MONOS PCT: 6 %
Monocytes Absolute: 1.8 10*3/uL — ABNORMAL HIGH (ref 0.1–1.0)
Neutro Abs: 24.4 10*3/uL — ABNORMAL HIGH (ref 1.7–7.7)
Neutrophils Relative %: 83 %
PLATELETS: 578 10*3/uL — AB (ref 150–400)
RBC: 4.6 MIL/uL (ref 3.87–5.11)
RDW: 17.8 % — ABNORMAL HIGH (ref 11.5–15.5)
WBC: 29.5 10*3/uL — AB (ref 4.0–10.5)

## 2016-06-24 LAB — COMPREHENSIVE METABOLIC PANEL
ALT: 23 U/L (ref 14–54)
ANION GAP: 12 (ref 5–15)
AST: 52 U/L — ABNORMAL HIGH (ref 15–41)
Albumin: 3.8 g/dL (ref 3.5–5.0)
Alkaline Phosphatase: 109 U/L (ref 38–126)
BILIRUBIN TOTAL: 1 mg/dL (ref 0.3–1.2)
BUN: 9 mg/dL (ref 6–20)
CALCIUM: 9.7 mg/dL (ref 8.9–10.3)
CO2: 23 mmol/L (ref 22–32)
Chloride: 100 mmol/L — ABNORMAL LOW (ref 101–111)
Creatinine, Ser: 1.18 mg/dL — ABNORMAL HIGH (ref 0.44–1.00)
GFR, EST AFRICAN AMERICAN: 51 mL/min — AB (ref >60)
GFR, EST NON AFRICAN AMERICAN: 44 mL/min — AB (ref >60)
Glucose, Bld: 434 mg/dL — ABNORMAL HIGH (ref 65–99)
POTASSIUM: 4.3 mmol/L (ref 3.5–5.1)
Sodium: 135 mmol/L (ref 135–145)
TOTAL PROTEIN: 6.6 g/dL (ref 6.5–8.1)

## 2016-06-24 LAB — URINALYSIS, ROUTINE W REFLEX MICROSCOPIC
Bilirubin Urine: NEGATIVE
Glucose, UA: >1000 mg/dL — AB
KETONES UR: NEGATIVE mg/dL
Nitrite: NEGATIVE
PROTEIN: NEGATIVE mg/dL
Specific Gravity, Urine: 1.03 (ref 1.005–1.030)
pH: 5.5 (ref 5.0–8.0)

## 2016-06-24 LAB — URINE MICROSCOPIC-ADD ON

## 2016-06-24 LAB — CBG MONITORING, ED
GLUCOSE-CAPILLARY: 477 mg/dL — AB (ref 65–99)
GLUCOSE-CAPILLARY: 268 mg/dL — AB (ref 65–99)
Glucose-Capillary: 342 mg/dL — ABNORMAL HIGH (ref 65–99)

## 2016-06-24 LAB — LIPASE, BLOOD: Lipase: 34 U/L (ref 11–51)

## 2016-06-24 MED ORDER — DIPHENOXYLATE-ATROPINE 2.5-0.025 MG PO TABS: 1.0000 | ORAL_TABLET | Freq: Four times a day (QID) | ORAL | 0 refills | Status: DC | PRN

## 2016-06-24 MED ORDER — ONDANSETRON 4 MG PO TBDP: 4.0000 mg | ORAL_TABLET | Freq: Three times a day (TID) | ORAL | 0 refills | Status: DC | PRN

## 2016-06-24 MED ORDER — ONDANSETRON 4 MG PO TBDP
ORAL_TABLET | ORAL | Status: AC
  Filled 2016-06-24: qty 1

## 2016-06-24 MED ORDER — SODIUM CHLORIDE 0.9 % IV BOLUS (SEPSIS)
1000.0000 mL | Freq: Once | INTRAVENOUS | Status: AC
  Administered 2016-06-24: 1000 mL via INTRAVENOUS

## 2016-06-24 MED ORDER — DIPHENOXYLATE-ATROPINE 2.5-0.025 MG PO TABS
2.0000 | ORAL_TABLET | Freq: Once | ORAL | Status: AC
  Administered 2016-06-24: 2 via ORAL
  Filled 2016-06-24: qty 2

## 2016-06-24 MED ORDER — SODIUM CHLORIDE 0.9 % IV SOLN: Freq: Once | INTRAVENOUS | Status: DC

## 2016-06-24 MED ORDER — ONDANSETRON 4 MG PO TBDP
4.0000 mg | ORAL_TABLET | Freq: Once | ORAL | Status: AC | PRN
  Administered 2016-06-24: 4 mg via ORAL

## 2016-06-24 MED ORDER — INSULIN ASPART 100 UNIT/ML ~~LOC~~ SOLN
10.0000 [IU] | Freq: Once | SUBCUTANEOUS | Status: AC
  Administered 2016-06-24: 10 [IU] via SUBCUTANEOUS
  Filled 2016-06-24: qty 1

## 2016-06-24 NOTE — ED Triage Notes (Signed)
Pt reports diarrhea since last Thursday accompanied by high blood sugars and generalized body aches and fatigue.

## 2016-06-24 NOTE — ED Notes (Signed)
Pt. Given saltines and water with EDP permission

## 2016-06-24 NOTE — ED Notes (Signed)
Pt. SOB after walking to room. Pt. Refused wheel chair. Oxygen saturation 97% on RA at this time. Pt. C/o feeling hungry and weak because she is a diabetic. Will check CBG at this time. Pt. Told to wait for food or drink until seen by EDP.

## 2016-06-24 NOTE — ED Notes (Signed)
IV team at bedside 

## 2016-06-24 NOTE — Discharge Instructions (Signed)
Follow-up with GI recommended if her diarrhea continues.  Lomotil as needed every 6 hours for diarrhea.  Push fluids to stay hydrated.  Zofran for nausea.  Return to the ER with a normal worsening symptoms

## 2016-06-24 NOTE — ED Provider Notes (Signed)
[facility] DEPT Provider Note   CSN: [id number] Arrival date & time: 06/24/16  1610     History   Chief Complaint Chief Complaint  Patient presents with  . Diarrhea  . Generalized Body Aches  . Hyperglycemia    HPI Amanda [name] is a 75 y.o. female.  She presents here with diarrhea. She states she's had diarrhea since "I was a little girl". Creasing frequency over the last several days. She's felt weak and been on the couch last 3-4 days. She was concerned she may be getting dehydrated. No blood, pus, or mucus. No abdominal pain. Nausea. No vomiting.  HPI  Past Medical History:  Diagnosis Date  . Allergic rhinitis 01/23/2016  . C. difficile colitis   . Cancer ([place])    SKIN  . Candidiasis of skin 09/30/2014  . Cirrhosis ([place])   . Depression   . Diabetes mellitus 2008  . Gout   . Herpes simplex   . Hyperglycemia 05/31/2013  . Hyperlipidemia   . Hypertension   . Myocardial infarction ([place])   . Neuromuscular disorder ([place])    BELL PALSY  . Obesity   . Polycythemia    Dr. Elease Hashimoto- [facility]  . Psoriasis     Patient Active Problem List   Diagnosis Date Noted  . OSA (obstructive sleep apnea) 04/21/2016  . Osteopenia 04/07/2016  . Hypersomnolence 03/21/2016  . Poorly controlled diabetes mellitus ([place]) 03/21/2016  . Lung nodule, solitary 02/05/2016  . Aortic dilatation ([place]) 02/05/2016  . Dyspnea 01/28/2016  . Allergic rhinitis 01/23/2016  . Rib pain on left side 11/20/2015  . Hepatic cirrhosis ([place]) 10/28/2015  . Irritable bowel syndrome 08/12/2015  . Abdominal bloating 07/07/2015  . Anorexia 06/17/2015  . Obesity (BMI 30-39.9) 04/30/2015  . Easy bruising 03/31/2015  . Coronary artery disease involving native coronary artery 03/01/2015  . Acute diastolic heart failure ([place]) 02/01/2015  . ST elevation myocardial infarction (STEMI) involving left circumflex coronary artery in recovery phase ([place]) 01/27/2015  . ST elevation myocardial infarction (STEMI) of  inferior wall, initial episode of care ([place])   . Idioventricular rhythm ([place])   . Diabetic peripheral neuropathy associated with type 2 diabetes mellitus ([place]) 01/23/2015  . Thrombocytosis ([place]) 07/04/2014  . Cholelithiasis 02/07/2014  . Edema 07/09/2013  . Constipation 07/09/2013  . Type 2 diabetes mellitus with neurological manifestations, uncontrolled ([place]) 06/07/2013  . Leukocytosis 06/02/2013  . 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 Essential hypertension 05/31/2013  . History of Bell's palsy 05/26/2013  . DERMATITIS, ATOPIC 11/16/2010  . DIZZINESS 11/16/2010  . WEIGHT GAIN 11/16/2010  . HERPETIC WHITLOW 06/30/2010  . DIASTOLIC DYSFUNCTION 49/17/9150  . Hyperlipidemia 12/15/2009  . Essential hypertension, benign 11/24/2009  . PSORIASIS 11/24/2009  . Depressive disorder, not elsewhere classified 09/13/2009  . Adiposity 07/08/2008  . CAFL (chronic airflow limitation) ([place]) 07/08/2008    Past Surgical History:  Procedure Laterality Date  . BREAST SURGERY Left    milk duct  . COLONOSCOPY    . LEFT HEART CATHETERIZATION WITH CORONARY ANGIOGRAM N/A 01/27/2015   Procedure: LEFT HEART CATHETERIZATION WITH CORONARY ANGIOGRAM;  Surgeon: [name], MD;  Location[facility] CATH LAB;  Service: Cardiovascular;  Laterality: N/A;  . TONSILLECTOMY    . TOTAL ABDOMINAL HYSTERECTOMY W/ BILATERAL SALPINGOOPHORECTOMY     for heavy periods with appendectomy    OB History    No data available       Home Medications    Prior to Admission medications   Medication Sig Start  Date End Date Taking? Authorizing Provider  amLODipine (NORVASC) 5 MG tablet Take 1 tablet (5 mg total) by mouth daily. 01/26/16  Yes Hali Marry, MD  calcium carbonate (OS-CAL) 600 MG tablet Take 2 tablets (1,200 mg total) by mouth daily. 04/07/16  Yes Yvonne R Lowne Chase, DO  canagliflozin (INVOKANA) 100 MG TABS tablet Take 1 tablet (100 mg total) by mouth daily. 02/12/16  Yes [name], MD    cholecalciferol (VITAMIN D) 1000 units tablet Take 1 tablet (1,000 Units total) by mouth daily. 04/07/16  Yes Yvonne R Lowne Chase, DO  dicyclomine (BENTYL) 10 MG capsule Take 1 tab by mouth every morning, may take twice daily as needed. 05/15/15  Yes Amy S Esterwood, PA-C  furosemide (LASIX) 20 MG tablet Take 1 tablet (20 mg total) by mouth daily. 01/26/16  Yes Hali Marry, MD  hydrochlorothiazide (HYDRODIURIL) 25 MG tablet Take 25 mg by mouth daily.  01/26/16  Yes Historical Provider, MD  insulin regular human CONCENTRATED (HUMULIN R U-500 KWIKPEN) 500 UNIT/ML kwikpen Inject 100 units under the skin before breakfast and 85 units before dinner. 03/24/16  Yes [name], MD  loperamide (IMODIUM A-D) 2 MG tablet Take 1-2 tablets (2-4 mg total) by mouth daily as needed for diarrhea or loose stools. 05/28/16  Yes Hali Marry, MD  losartan (COZAAR) 100 MG tablet Take 1 tablet (100 mg total) by mouth daily. 01/26/16  Yes Hali Marry, MD  nitroGLYCERIN (NITROSTAT) 0.4 MG SL tablet Place 1 tablet (0.4 mg total) under the tongue every 5 (five) minutes as needed for chest pain. 02/06/15  Yes [name], MD  NONFORMULARY OR COMPOUNDED ITEM Neuropathy cream: mix Amitriptyline 2%, Lidocaine 2%, and Ketamine 1% - apply on feet 2x a day [facility]. 01/23/15  Yes [name], MD  omega-3 acid ethyl esters (LOVAZA) 1 G capsule Take 1 capsule (1 g total) by mouth 2 (two) times daily. 02/07/15  Yes [name], MD  [facility] VERIO test strip Use to test blood sugar 3 times daily. 01/26/16  Yes Historical Provider, MD  promethazine (PHENERGAN) 25 MG tablet Take 0.5 tablets (12.5 mg total) by mouth every 6 (six) hours as needed for nausea or vomiting. 06/08/16  Yes Volanda Napoleon, MD  rosuvastatin (CRESTOR) 20 MG tablet Take 1 tablet (20 mg total) by mouth daily. 05/12/15  Yes [name], MD  ruxolitinib phosphate (JAKAFI) 25 MG tablet Take 1 tablet (25 mg total) by mouth 2  (two) times daily. 06/02/16  Yes Volanda Napoleon, MD  ticagrelor (BRILINTA) 60 MG TABS tablet Take 1 tablet (60 mg total) by mouth 2 (two) times daily. 03/02/16  Yes [name], MD  diphenoxylate-atropine (LOMOTIL) 2.5-0.025 MG tablet Take 1 tablet by mouth 4 (four) times daily as needed for diarrhea or loose stools. 06/24/16   Tanna Furry, MD  ondansetron (ZOFRAN ODT) 4 MG disintegrating tablet Take 1 tablet (4 mg total) by mouth every 8 (eight) hours as needed for nausea. 06/24/16   Tanna Furry, MD    Family History Family History  Problem Relation Age of Onset  . Diabetes Mother   . Heart disease Mother     CAD  . Hyperlipidemia Mother   . Hypertension Mother   . Kidney disease Mother   . Kidney disease Father   . Breast cancer Maternal Aunt   . Heart disease Maternal Grandmother   . Heart disease Other     maternal aunts and  uncles    Social History Social History  Substance Use Topics  . Smoking status: Former Smoker    Years: 48.00    Types: Cigarettes    Quit date: 10/18/1986  . Smokeless tobacco: Never Used  . Alcohol use No     Allergies   Lisinopril; Tape; Doxycycline; and Latex   Review of Systems Review of Systems  Constitutional: Negative for appetite change, chills, diaphoresis, fatigue and fever.  HENT: Negative for mouth sores, sore throat and trouble swallowing.   Eyes: Negative for visual disturbance.  Respiratory: Negative for cough, chest tightness, shortness of breath and wheezing.   Cardiovascular: Negative for chest pain.  Gastrointestinal: Positive for abdominal pain, diarrhea and nausea. Negative for abdominal distention and vomiting.  Endocrine: Negative for polydipsia, polyphagia and polyuria.  Genitourinary: Negative for dysuria, frequency and hematuria.  Musculoskeletal: Negative for gait problem.  Skin: Negative for color change, pallor and rash.  Neurological: Negative for dizziness, syncope, light-headedness and headaches.  Hematological:  Does not bruise/bleed easily.  Psychiatric/Behavioral: Negative for behavioral problems and confusion.     Physical Exam Updated Vital Signs BP 112/55 (BP Location: Right Arm)   Pulse 108   Temp 98.6 F (37 C) (Oral)   Resp 16   SpO2 97%   Physical Exam  Constitutional: She is oriented to person, place, and time. She appears well-developed and well-nourished. No distress.  HENT:  Head: Normocephalic.  Eyes: Conjunctivae are normal. Pupils are equal, round, and reactive to light. No scleral icterus.  Neck: Normal range of motion. Neck supple. No thyromegaly present.  Cardiovascular: Normal rate and regular rhythm.  Exam reveals no gallop and no friction rub.   No murmur heard. Pulmonary/Chest: Effort normal and breath sounds normal. No respiratory distress. She has no wheezes. She has no rales.  Abdominal: Soft. Bowel sounds are normal. She exhibits no distension. There is no tenderness. There is no rebound.  Soft nondistended nontender normal active bowel sounds.  Musculoskeletal: Normal range of motion.  Neurological: She is alert and oriented to person, place, and time.  Skin: Skin is warm and dry. No rash noted.  Psychiatric: She has a normal mood and affect. Her behavior is normal.     ED Treatments / Results  Labs (all labs ordered are listed, but only abnormal results are displayed) Labs Reviewed  COMPREHENSIVE METABOLIC PANEL - Abnormal; Notable for the following:       Result Value   Chloride 100 (*)    Glucose, Bld 434 (*)    Creatinine, Ser 1.18 (*)    AST 52 (*)    GFR calc non Af Amer 44 (*)    GFR calc Af Amer 51 (*)    All other components within normal limits  URINALYSIS, ROUTINE W REFLEX MICROSCOPIC (NOT AT [facility]) - Abnormal; Notable for the following:    APPearance CLOUDY (*)    Glucose, UA >1000 (*)    Hgb urine dipstick TRACE (*)    Leukocytes, UA MODERATE (*)    All other components within normal limits  CBC WITH DIFFERENTIAL/PLATELET - Abnormal;  Notable for the following:    WBC 29.5 (*)    RDW 17.8 (*)    Platelets 578 (*)    Neutro Abs 24.4 (*)    Monocytes Absolute 1.8 (*)    All other components within normal limits  URINE MICROSCOPIC-ADD ON - Abnormal; Notable for the following:    Squamous Epithelial / LPF 0-5 (*)    Bacteria, UA  FEW (*)    All other components within normal limits  CBG MONITORING, ED - Abnormal; Notable for the following:    Glucose-Capillary 477 (*)    All other components within normal limits  CBG MONITORING, ED - Abnormal; Notable for the following:    Glucose-Capillary 342 (*)    All other components within normal limits  CBG MONITORING, ED - Abnormal; Notable for the following:    Glucose-Capillary 268 (*)    All other components within normal limits  LIPASE, BLOOD    EKG  EKG Interpretation None       Radiology No results found.  Procedures Procedures (including critical care time)  Medications Ordered in ED Medications  ondansetron (ZOFRAN-ODT) 4 MG disintegrating tablet (not administered)  0.9 %  sodium chloride infusion ( Intravenous Hold 06/24/16 2147)  ondansetron (ZOFRAN-ODT) disintegrating tablet 4 mg (4 mg Oral Given 06/24/16 1708)  sodium chloride 0.9 % bolus 1,000 mL (1,000 mLs Intravenous New Bag/Given 06/24/16 2230)  diphenoxylate-atropine (LOMOTIL) 2.5-0.025 MG per tablet 2 tablet (2 tablets Oral Given 06/24/16 2137)  insulin aspart (novoLOG) injection 10 Units (10 Units Subcutaneous Given 06/24/16 2135)     Initial Impression / Assessment and Plan / ED Course  I have reviewed the triage vital signs and the nursing notes.  Pertinent labs & imaging results that were available during my care of the patient were reviewed by me and considered in my medical decision making (see chart for details).  Clinical Course    Reassuring labs. Given insulin. Blood sugar improves. Thirsty hungry and anxious for discharge. Appropriate for discharge.  No urinary symptoms. Urine culture  requested. Does have 6:30 the PVCs but squamous cells and yeast. We'll await culture. No plan treatment at this time.  Final Clinical Impressions(s) / ED Diagnoses   Final diagnoses:  Diarrhea, unspecified type    New Prescriptions New Prescriptions   DIPHENOXYLATE-ATROPINE (LOMOTIL) 2.5-0.025 MG TABLET    Take 1 tablet by mouth 4 (four) times daily as needed for diarrhea or loose stools.   ONDANSETRON (ZOFRAN ODT) 4 MG DISINTEGRATING TABLET    Take 1 tablet (4 mg total) by mouth every 8 (eight) hours as needed for nausea.     Tanna Furry, MD 06/24/16 2308

## 2016-06-24 NOTE — ED Notes (Signed)
Attempted IV x2. IV team ordered

## 2016-07-02 ENCOUNTER — Telehealth: Payer: Self-pay

## 2016-07-02 NOTE — Telephone Encounter (Signed)
[facility] patient assistance called regarding the insulin. They changed their guidelines for the assistance, and the patients now have to be on at least 200 units of insulin. The patient is currently taking 185 daily with the [employer]. The [facility] associate notified me that if we could just put it in the signature for the RX to titrate up to 200 units that the patient would continue to get the assistance, but I was not comfortable changing the sig without your approval. Please advise if we can do this so the patient can keep her assistance. Thank you!

## 2016-07-06 NOTE — Telephone Encounter (Signed)
OK to add the "titrate up to 200 units" phrase.

## 2016-07-19 ENCOUNTER — Institutional Professional Consult (permissible substitution): Payer: Medicare Other | Admitting: Pulmonary Disease

## 2016-07-20 ENCOUNTER — Ambulatory Visit: Payer: Medicare Other | Admitting: Hematology & Oncology

## 2016-07-20 ENCOUNTER — Other Ambulatory Visit: Payer: Medicare Other

## 2016-07-29 ENCOUNTER — Ambulatory Visit (HOSPITAL_BASED_OUTPATIENT_CLINIC_OR_DEPARTMENT_OTHER): Payer: Medicare Other

## 2016-07-29 ENCOUNTER — Encounter: Payer: Self-pay | Admitting: Hematology & Oncology

## 2016-07-29 ENCOUNTER — Other Ambulatory Visit (HOSPITAL_BASED_OUTPATIENT_CLINIC_OR_DEPARTMENT_OTHER): Payer: Medicare Other

## 2016-07-29 ENCOUNTER — Ambulatory Visit (HOSPITAL_BASED_OUTPATIENT_CLINIC_OR_DEPARTMENT_OTHER): Payer: Medicare Other | Admitting: Hematology & Oncology

## 2016-07-29 VITALS — BP 137/59 | HR 89 | Temp 97.3°F | Resp 20 | Ht 64.0 in | Wt 226.1 lb

## 2016-07-29 DIAGNOSIS — Z23 Encounter for immunization: Secondary | ICD-10-CM

## 2016-07-29 DIAGNOSIS — M7989 Other specified soft tissue disorders: Secondary | ICD-10-CM | POA: Diagnosis not present

## 2016-07-29 DIAGNOSIS — R110 Nausea: Secondary | ICD-10-CM

## 2016-07-29 DIAGNOSIS — L03012 Cellulitis of left finger: Secondary | ICD-10-CM | POA: Diagnosis not present

## 2016-07-29 DIAGNOSIS — D45 Polycythemia vera: Secondary | ICD-10-CM | POA: Diagnosis not present

## 2016-07-29 LAB — CBC WITH DIFFERENTIAL (CANCER CENTER ONLY)
BASO#: 0.3 10e3/uL — ABNORMAL HIGH (ref 0.0–0.2)
BASO%: 1.5 % (ref 0.0–2.0)
EOS%: 1.5 % (ref 0.0–7.0)
Eosinophils Absolute: 0.3 10e3/uL (ref 0.0–0.5)
HCT: 36.8 % (ref 34.8–46.6)
HGB: 12.2 g/dL (ref 11.6–15.9)
LYMPH#: 4.4 10e3/uL — ABNORMAL HIGH (ref 0.9–3.3)
LYMPH%: 23.8 % (ref 14.0–48.0)
MCH: 28.4 pg (ref 26.0–34.0)
MCHC: 33.2 g/dL (ref 32.0–36.0)
MCV: 86 fL (ref 81–101)
MONO#: 0.9 10e3/uL (ref 0.1–0.9)
MONO%: 4.9 % (ref 0.0–13.0)
NEUT#: 12.5 10e3/uL — ABNORMAL HIGH (ref 1.5–6.5)
NEUT%: 68.3 % (ref 39.6–80.0)
Platelets: 467 10e3/uL — ABNORMAL HIGH (ref 145–400)
RBC: 4.3 10e6/uL (ref 3.70–5.32)
RDW: 17.4 % — ABNORMAL HIGH (ref 11.1–15.7)
WBC: 18.3 10e3/uL — ABNORMAL HIGH (ref 3.9–10.0)

## 2016-07-29 LAB — CMP (CANCER CENTER ONLY)
ALT: 30 U/L (ref 10–47)
AST: 57 U/L — ABNORMAL HIGH (ref 11–38)
Albumin: 3.6 g/dL (ref 3.3–5.5)
Alkaline Phosphatase: 97 U/L — ABNORMAL HIGH (ref 26–84)
BUN: 16 mg/dL (ref 7–22)
CHLORIDE: 105 meq/L (ref 98–108)
CO2: 25 mEq/L (ref 18–33)
CREATININE: 0.9 mg/dL (ref 0.6–1.2)
Calcium: 9.4 mg/dL (ref 8.0–10.3)
GLUCOSE: 199 mg/dL — AB (ref 73–118)
POTASSIUM: 4.3 meq/L (ref 3.3–4.7)
SODIUM: 133 meq/L (ref 128–145)
Total Bilirubin: 0.7 mg/dl (ref 0.20–1.60)
Total Protein: 6.5 g/dL (ref 6.4–8.1)

## 2016-07-29 LAB — TECHNOLOGIST REVIEW CHCC SATELLITE

## 2016-07-29 LAB — CHCC SATELLITE - SMEAR

## 2016-07-29 LAB — LACTATE DEHYDROGENASE: LDH: 290 U/L — ABNORMAL HIGH (ref 125–245)

## 2016-07-29 MED ORDER — CEPHALEXIN 500 MG PO CAPS: 500.0000 mg | ORAL_CAPSULE | Freq: Three times a day (TID) | ORAL | 0 refills | Status: DC

## 2016-07-29 MED ORDER — INFLUENZA VAC SPLIT QUAD 0.5 ML IM SUSY
0.5000 mL | PREFILLED_SYRINGE | Freq: Once | INTRAMUSCULAR | Status: AC
  Administered 2016-07-29: 0.5 mL via INTRAMUSCULAR
  Filled 2016-07-29: qty 0.5

## 2016-07-29 NOTE — Progress Notes (Signed)
Hematology and Oncology Follow Up Visit  Amanda [name] [id number] Nov 09, 1940 75 y.o. 07/29/2016   Principle Diagnosis:   [name]-JAK2 (+)  Current Therapy:    Jakafi 70m po BID     Interim History:  Ms. KFrommeris back for follow-up. As usual, she has quite a few issues. She is taking the [place] She still has a little nausea with it.  She was bit by spider a few weeks ago. This was on the left hand. Of course, she never bothered to see her family doctor about this. There is some swelling and some slight redness.   She also is complaining of some swelling in the left ankle. She says she's had this for a while. There is no pain. I suspect he probably will have to get a Doppler.   Her blood sugars still are quite high. The last that we saw her, her blood sugar was 562. Today is 200.   She did enjoy the Jewish new year. She ate quite a bit.   She has had no rashes. She's had no fever. She's had no increased cough. There's been no change in bowel or bladder habits. She has chronic diarrhea issues.   Overall, her performance status is ECOG 2.  Medications:  Current Outpatient Prescriptions:  .  amLODipine (NORVASC) 5 MG tablet, Take 1 tablet (5 mg total) by mouth daily., Disp: 30 tablet, Rfl: 1 .  calcium carbonate (OS-CAL) 600 MG tablet, Take 2 tablets (1,200 mg total) by mouth daily., Disp: 60 tablet, Rfl:  .  canagliflozin (INVOKANA) 100 MG TABS tablet, Take 1 tablet (100 mg total) by mouth daily., Disp: 30 tablet, Rfl: 5 .  cholecalciferol (VITAMIN D) 1000 units tablet, Take 1 tablet (1,000 Units total) by mouth daily., Disp: 30 tablet, Rfl:  .  dicyclomine (BENTYL) 10 MG capsule, Take 1 tab by mouth every morning, may take twice daily as needed., Disp: 60 capsule, Rfl: 8 .  diphenoxylate-atropine (LOMOTIL) 2.5-0.025 MG tablet, Take 1 tablet by mouth 4 (four) times daily as needed for diarrhea or loose stools., Disp: 30 tablet, Rfl: 0 .  furosemide (LASIX) 20 MG tablet,  Take 1 tablet (20 mg total) by mouth daily., Disp: 20 tablet, Rfl: 0 .  hydrochlorothiazide (HYDRODIURIL) 25 MG tablet, Take 25 mg by mouth daily. , Disp: , Rfl:  .  insulin regular human CONCENTRATED (HUMULIN R U-500 KWIKPEN) 500 UNIT/ML kwikpen, Inject 100 units under the skin before breakfast and 85 units before dinner., Disp: 18 pen, Rfl: 1 .  loperamide (IMODIUM A-D) 2 MG tablet, Take 1-2 tablets (2-4 mg total) by mouth daily as needed for diarrhea or loose stools., Disp: 30 tablet, Rfl: 1 .  losartan (COZAAR) 100 MG tablet, Take 1 tablet (100 mg total) by mouth daily., Disp: 90 tablet, Rfl: 1 .  nitroGLYCERIN (NITROSTAT) 0.4 MG SL tablet, Place 1 tablet (0.4 mg total) under the tongue every 5 (five) minutes as needed for chest pain., Disp: 25 tablet, Rfl: 3 .  NONFORMULARY OR COMPOUNDED ITEM, Neuropathy cream: mix Amitriptyline 2%, Lidocaine 2%, and Ketamine 1% - apply on feet 2x a day [facility]., Disp: 1 each, Rfl: 2 .  omega-3 acid ethyl esters (LOVAZA) 1 G capsule, Take 1 capsule (1 g total) by mouth 2 (two) times daily., Disp: 60 capsule, Rfl: 11 .  ondansetron (ZOFRAN ODT) 4 MG disintegrating tablet, Take 1 tablet (4 mg total) by mouth every 8 (eight) hours as needed for nausea., Disp: 6 tablet,  Rfl: 0 .  ONETOUCH VERIO test strip, Use to test blood sugar 3 times daily., Disp: , Rfl:  .  promethazine (PHENERGAN) 25 MG tablet, Take 0.5 tablets (12.5 mg total) by mouth every 6 (six) hours as needed for nausea or vomiting., Disp: 30 tablet, Rfl: 1 .  rosuvastatin (CRESTOR) 20 MG tablet, Take 1 tablet (20 mg total) by mouth daily., Disp: 90 tablet, Rfl: 3 .  ruxolitinib phosphate (JAKAFI) 25 MG tablet, Take 1 tablet (25 mg total) by mouth 2 (two) times daily., Disp: 60 tablet, Rfl: 3 .  ticagrelor (BRILINTA) 60 MG TABS tablet, Take 1 tablet (60 mg total) by mouth 2 (two) times daily., Disp: 28 tablet, Rfl: 0 .  cephALEXin (KEFLEX) 500 MG capsule, Take 1 capsule (500 mg total) by mouth  3 (three) times daily., Disp: 21 capsule, Rfl: 0  Allergies:  Allergies  Allergen Reactions  . Lisinopril Cough  . Tape Hives  . Doxycycline Hives, Swelling and Rash  . Latex Hives, Itching and Rash    Past Medical History, Surgical history, Social history, and Family History were reviewed and updated.  Review of Systems: As above  Physical Exam:  height is 5' 4"  (1.626 m) and weight is 226 lb 1.9 oz (102.6 kg). Her oral temperature is 97.3 F (36.3 C). Her blood pressure is 137/59 (abnormal) and her pulse is 89. Her respiration is 20.   Wt Readings from Last 3 Encounters:  07/29/16 226 lb 1.9 oz (102.6 kg)  06/07/16 226 lb (102.5 kg)  05/25/16 233 lb (105.7 kg)     Well-developed and well-nourished white female who is moderately obese. Head and neck exam shows no ocular or oral lesions. There are no palpable cervical or supraclavicular lymph nodes. She has no palpable thyroid. Lungs are clear bilaterally. She may have some wheezing and some crackles at the bases bilaterally. Cardiac exam regular rate and rhythm with no murmurs, rubs or bruits. Abdomen is soft. She has good bowel sounds. She is obese. She has no fluid wave. There is no palpable liver or spleen tip. Back exam shows no tenderness over the spine, ribs or hips. Extremity shows no clubbing, cyanosis or edema. Skin exam shows no rashes, ecchymoses or petechia. Neurological exam shows no focal neurological deficits. Lab Results  Component Value Date   WBC 18.3 (H) 07/29/2016   HGB 12.2 07/29/2016   HCT 36.8 07/29/2016   MCV 86 07/29/2016   PLT 467 (H) 07/29/2016     Chemistry      Component Value Date/Time   NA 133 07/29/2016 0850   NA 132 (L) 11/20/2015 0809   K 4.3 07/29/2016 0850   K 4.1 11/20/2015 0809   CL 105 07/29/2016 0850   CL 103 02/16/2013 0900   CO2 25 07/29/2016 0850   CO2 19 (L) 11/20/2015 0809   BUN 16 07/29/2016 0850   BUN 21.3 11/20/2015 0809   CREATININE 0.9 07/29/2016 0850   CREATININE  2.8 (H) 11/20/2015 0809      Component Value Date/Time   CALCIUM 9.4 07/29/2016 0850   CALCIUM 8.5 11/20/2015 0809   ALKPHOS 97 (H) 07/29/2016 0850   ALKPHOS 109 11/20/2015 0809   AST 57 (H) 07/29/2016 0850   AST 25 11/20/2015 0809   ALT 30 07/29/2016 0850   ALT 14 11/20/2015 0809   BILITOT 0.70 07/29/2016 0850   BILITOT 0.56 11/20/2015 0809         Impression and Plan: Ms. Parsley is A 75 year old white female.  She has a polycythemia vera. She is JAK2 positive.  Her blood counts look pretty stable from my point of view.  I still think that her big problem will be her diabetes. I just don't know how aggressive she is in trying to manage this.  His heart assay she has a little cellulitis on the index finger of her left hand from the spider bite. I will put her on some Keflex to make sure that there does not can to be an infection.  I'm glad that she is taking the JAKAFI on a regular basis now. She does get nauseated on occasion but does take some Phenergan which seems to help.   I will get a Doppler of her left leg just to be cautious. I don't think she has a blood clot. However she is at a higher risk because of the polycythemia.  I think we can probably get her back now in 6 weeks. I think this would be reasonable.    Volanda Napoleon, MD 10/12/201710:51 AM

## 2016-07-29 NOTE — Patient Instructions (Signed)

## 2016-07-30 ENCOUNTER — Telehealth: Payer: Self-pay | Admitting: Nurse Practitioner

## 2016-07-30 ENCOUNTER — Ambulatory Visit (HOSPITAL_BASED_OUTPATIENT_CLINIC_OR_DEPARTMENT_OTHER)
Admission: RE | Admit: 2016-07-30 | Discharge: 2016-07-30 | Disposition: A | Discharge status: Home / Self Care | Payer: Medicare Other | Source: Ambulatory Visit | Attending: Hematology & Oncology | Admitting: Hematology & Oncology

## 2016-07-30 DIAGNOSIS — M7989 Other specified soft tissue disorders: Secondary | ICD-10-CM | POA: Insufficient documentation

## 2016-07-30 DIAGNOSIS — L03012 Cellulitis of left finger: Secondary | ICD-10-CM | POA: Diagnosis not present

## 2016-07-30 DIAGNOSIS — D45 Polycythemia vera: Secondary | ICD-10-CM | POA: Insufficient documentation

## 2016-07-30 DIAGNOSIS — Z23 Encounter for immunization: Secondary | ICD-10-CM

## 2016-07-30 NOTE — Telephone Encounter (Addendum)
Spoke with daughter and she verbalized understanding and appreciating. Will let mother know. ----- Message from Volanda Napoleon, MD sent at 07/30/2016 11:37 AM EDT ----- Call - NO blood clot in the leg!!!  Amanda [name]

## 2016-08-20 ENCOUNTER — Other Ambulatory Visit: Payer: Self-pay | Admitting: Cardiovascular Disease

## 2016-08-20 NOTE — Telephone Encounter (Signed)
°*  STAT* If patient is at the pharmacy, call can be transferred to refill team.   1. Which medications need to be refilled? (please list name of each medication and dose if known) Crestor 15m 2. Which pharmacy/location (including street and city if local pharmacy) is medication to be sent to? [facility] 3. Do they need a 30 day or 90 day supply? [address] [place]

## 2016-08-25 MED ORDER — ROSUVASTATIN CALCIUM 20 MG PO TABS: 20.0000 mg | ORAL_TABLET | Freq: Every day | ORAL | 0 refills | Status: DC

## 2016-08-25 NOTE — Telephone Encounter (Signed)
Rx has been sent to the pharmacy electronically. Crestor #[phone] refills pt needs appointment

## 2016-09-10 ENCOUNTER — Ambulatory Visit (HOSPITAL_BASED_OUTPATIENT_CLINIC_OR_DEPARTMENT_OTHER): Payer: Medicare Other

## 2016-09-10 ENCOUNTER — Ambulatory Visit (HOSPITAL_BASED_OUTPATIENT_CLINIC_OR_DEPARTMENT_OTHER): Payer: Medicare Other | Admitting: Hematology & Oncology

## 2016-09-10 ENCOUNTER — Other Ambulatory Visit: Payer: Self-pay | Admitting: Family Medicine

## 2016-09-10 ENCOUNTER — Other Ambulatory Visit (HOSPITAL_BASED_OUTPATIENT_CLINIC_OR_DEPARTMENT_OTHER): Payer: Medicare Other

## 2016-09-10 VITALS — BP 169/79 | HR 90 | Temp 97.7°F | Resp 16 | Wt 231.0 lb

## 2016-09-10 DIAGNOSIS — L03012 Cellulitis of left finger: Secondary | ICD-10-CM

## 2016-09-10 DIAGNOSIS — D45 Polycythemia vera: Secondary | ICD-10-CM | POA: Diagnosis not present

## 2016-09-10 DIAGNOSIS — Z23 Encounter for immunization: Secondary | ICD-10-CM

## 2016-09-10 DIAGNOSIS — B3789 Other sites of candidiasis: Secondary | ICD-10-CM

## 2016-09-10 DIAGNOSIS — B372 Candidiasis of skin and nail: Secondary | ICD-10-CM

## 2016-09-10 DIAGNOSIS — I10 Essential (primary) hypertension: Secondary | ICD-10-CM

## 2016-09-10 DIAGNOSIS — M7989 Other specified soft tissue disorders: Secondary | ICD-10-CM

## 2016-09-10 DIAGNOSIS — R21 Rash and other nonspecific skin eruption: Secondary | ICD-10-CM

## 2016-09-10 LAB — COMPREHENSIVE METABOLIC PANEL
ALBUMIN: 3.5 g/dL (ref 3.5–5.0)
ALK PHOS: 115 U/L (ref 40–150)
ALT: 21 U/L (ref 0–55)
ANION GAP: 12 meq/L — AB (ref 3–11)
AST: 53 U/L — AB (ref 5–34)
BUN: 12.6 mg/dL (ref 7.0–26.0)
CALCIUM: 9 mg/dL (ref 8.4–10.4)
CHLORIDE: 103 meq/L (ref 98–109)
CO2: 21 mEq/L — ABNORMAL LOW (ref 22–29)
CREATININE: 1 mg/dL (ref 0.6–1.1)
EGFR: 52 mL/min/{1.73_m2} — ABNORMAL LOW (ref >90)
Glucose: 399 mg/dl — ABNORMAL HIGH (ref 70–140)
Potassium: 4.1 mEq/L (ref 3.5–5.1)
Sodium: 136 mEq/L (ref 136–145)
Total Bilirubin: 1.04 mg/dL (ref 0.20–1.20)
Total Protein: 6.8 g/dL (ref 6.4–8.3)

## 2016-09-10 LAB — MANUAL DIFFERENTIAL (CHCC SATELLITE)
ALC: 2.2 10*3/uL (ref 0.6–2.2)
ANC (CHCC MAN DIFF): 18.2 10*3/uL — AB (ref 1.5–6.7)
Band Neutrophils: 4 % (ref 0–10)
LYMPH: 10 % — AB (ref 14–48)
MONO: 7 % (ref 0–13)
MYELOCYTES: 3 % — AB (ref 0–0)
Metamyelocytes: 2 % — ABNORMAL HIGH (ref 0–0)
PLT EST ~~LOC~~: INCREASED
SEG: 74 % (ref 40–75)

## 2016-09-10 LAB — CBC WITH DIFFERENTIAL (CANCER CENTER ONLY)
HCT: 35.6 % (ref 34.8–46.6)
HGB: 11.6 g/dL (ref 11.6–15.9)
MCH: 28.4 pg (ref 26.0–34.0)
MCHC: 32.6 g/dL (ref 32.0–36.0)
MCV: 87 fL (ref 81–101)
PLATELETS: 462 10*3/uL — AB (ref 145–400)
RBC: 4.09 10*6/uL (ref 3.70–5.32)
RDW: 19.3 % — AB (ref 11.1–15.7)
WBC: 21.9 10*3/uL — AB (ref 3.9–10.0)

## 2016-09-10 LAB — LACTATE DEHYDROGENASE: LDH: 483 U/L — ABNORMAL HIGH (ref 125–245)

## 2016-09-10 MED ORDER — FLUCONAZOLE 100 MG PO TABS: 100.0000 mg | ORAL_TABLET | Freq: Every day | ORAL | 0 refills | Status: DC

## 2016-09-10 MED ORDER — FLUCONAZOLE IN SODIUM CHLORIDE 400-0.9 MG/200ML-% IV SOLN
400.0000 mg | Freq: Once | INTRAVENOUS | Status: AC
  Administered 2016-09-10: 400 mg via INTRAVENOUS
  Filled 2016-09-10: qty 200

## 2016-09-10 NOTE — Progress Notes (Signed)
Hematology and Oncology Follow Up Visit  Amanda [name] [id number] 1941/02/14 75 y.o. 09/10/2016   Principle Diagnosis:   [name]-JAK2 (+)  Current Therapy:    Jakafi 68m po BID     Interim History:  Amanda [name] back for follow-up. As usual, she has quite a few issues. She is taking the [place] She is miserable today. She is bothered by Candida under her breasts. This is been going on for a few weeks. She's been on topical therapy which has not helped.  I'm sure that her blood sugars have not been all that great. I would think this probably is the source of the candida.   She is doing well with JAKAFI. She's having no nausea or vomiting. She's having no new issues with diarrhea. She has chronic diarrhea.   There's been no fever. She's had no leg swelling. She's had no headache.  Overall, her performance status is ECOG 2.  Medications:  Current Outpatient Prescriptions:  .  amLODipine (NORVASC) 5 MG tablet, Take 1 tablet (5 mg total) by mouth daily., Disp: 30 tablet, Rfl: 1 .  calcium carbonate (OS-CAL) 600 MG tablet, Take 2 tablets (1,200 mg total) by mouth daily., Disp: 60 tablet, Rfl:  .  canagliflozin (INVOKANA) 100 MG TABS tablet, Take 1 tablet (100 mg total) by mouth daily., Disp: 30 tablet, Rfl: 5 .  cholecalciferol (VITAMIN D) 1000 units tablet, Take 1 tablet (1,000 Units total) by mouth daily., Disp: 30 tablet, Rfl:  .  dicyclomine (BENTYL) 10 MG capsule, Take 1 tab by mouth every morning, may take twice daily as needed., Disp: 60 capsule, Rfl: 8 .  diphenoxylate-atropine (LOMOTIL) 2.5-0.025 MG tablet, Take 1 tablet by mouth 4 (four) times daily as needed for diarrhea or loose stools., Disp: 30 tablet, Rfl: 0 .  furosemide (LASIX) 20 MG tablet, Take 1 tablet (20 mg total) by mouth daily., Disp: 20 tablet, Rfl: 0 .  hydrochlorothiazide (HYDRODIURIL) 25 MG tablet, Take 25 mg by mouth daily. , Disp: , Rfl:  .  insulin regular human CONCENTRATED (HUMULIN R U-500  KWIKPEN) 500 UNIT/ML kwikpen, Inject 100 units under the skin before breakfast and 85 units before dinner., Disp: 18 pen, Rfl: 1 .  loperamide (IMODIUM A-D) 2 MG tablet, Take 1-2 tablets (2-4 mg total) by mouth daily as needed for diarrhea or loose stools., Disp: 30 tablet, Rfl: 1 .  losartan (COZAAR) 100 MG tablet, Take 1 tablet (100 mg total) by mouth daily., Disp: 90 tablet, Rfl: 1 .  nitroGLYCERIN (NITROSTAT) 0.4 MG SL tablet, Place 1 tablet (0.4 mg total) under the tongue every 5 (five) minutes as needed for chest pain., Disp: 25 tablet, Rfl: 3 .  NONFORMULARY OR COMPOUNDED ITEM, Neuropathy cream: mix Amitriptyline 2%, Lidocaine 2%, and Ketamine 1% - apply on feet 2x a day [facility]., Disp: 1 each, Rfl: 2 .  omega-3 acid ethyl esters (LOVAZA) 1 G capsule, Take 1 capsule (1 g total) by mouth 2 (two) times daily., Disp: 60 capsule, Rfl: 11 .  ondansetron (ZOFRAN ODT) 4 MG disintegrating tablet, Take 1 tablet (4 mg total) by mouth every 8 (eight) hours as needed for nausea., Disp: 6 tablet, Rfl: 0 .  ONETOUCH VERIO test strip, Use to test blood sugar 3 times daily., Disp: , Rfl:  .  promethazine (PHENERGAN) 25 MG tablet, Take 0.5 tablets (12.5 mg total) by mouth every 6 (six) hours as needed for nausea or vomiting., Disp: 30 tablet, Rfl: 1 .  rosuvastatin (  CRESTOR) 20 MG tablet, Take 1 tablet (20 mg total) by mouth daily. Need appointment, Disp: 30 tablet, Rfl: 0 .  ruxolitinib phosphate (JAKAFI) 25 MG tablet, Take 1 tablet (25 mg total) by mouth 2 (two) times daily., Disp: 60 tablet, Rfl: 3 .  ticagrelor (BRILINTA) 60 MG TABS tablet, Take 1 tablet (60 mg total) by mouth 2 (two) times daily., Disp: 28 tablet, Rfl: 0 .  fluconazole (DIFLUCAN) 100 MG tablet, Take 1 tablet (100 mg total) by mouth daily., Disp: 10 tablet, Rfl: 0  Current Facility-Administered Medications:  .  fluconazole (DIFLUCAN) IVPB 400 mg, 400 mg, Intravenous, Once, Volanda Napoleon, MD, 400 mg at 09/10/16  1113  Allergies:  Allergies  Allergen Reactions  . Lisinopril Cough  . Tape Hives  . Doxycycline Hives, Swelling and Rash  . Latex Hives, Itching and Rash    Past Medical History, Surgical history, Social history, and Family History were reviewed and updated.  Review of Systems: As above  Physical Exam:  weight is 231 lb (104.8 kg). Her oral temperature is 97.7 F (36.5 C). Her blood pressure is 169/79 (abnormal) and her pulse is 90. Her respiration is 16.   Wt Readings from Last 3 Encounters:  09/10/16 231 lb (104.8 kg)  07/29/16 226 lb 1.9 oz (102.6 kg)  06/07/16 226 lb (102.5 kg)     Well-developed and well-nourished white female who is moderately obese. Head and neck exam shows no ocular or oral lesions. There are no palpable cervical or supraclavicular lymph nodes. She has no palpable thyroid. Lungs are clear bilaterally. She may have some wheezing and some crackles at the bases bilaterally. Cardiac exam regular rate and rhythm with no murmurs, rubs or bruits. Abdomen is soft. She has good bowel sounds. She is obese. She has no fluid wave. There is no palpable liver or spleen tip. Back exam shows no tenderness over the spine, ribs or hips. Extremity shows no clubbing, cyanosis or edema. Skin examShows extensive candida infection under the breasts bilaterally. There is a lot of erythema under the breasts bilaterally. There is a slight exudate. Otherwise, there are no rashes, ecchymoses or petechia. Neurological exam shows no focal neurological deficits. Lab Results  Component Value Date   WBC 21.9 (H) 09/10/2016   HGB 11.6 09/10/2016   HCT 35.6 09/10/2016   MCV 87 09/10/2016   PLT 462 (H) 09/10/2016     Chemistry      Component Value Date/Time   NA 133 07/29/2016 0850   NA 132 (L) 11/20/2015 0809   K 4.3 07/29/2016 0850   K 4.1 11/20/2015 0809   CL 105 07/29/2016 0850   CL 103 02/16/2013 0900   CO2 25 07/29/2016 0850   CO2 19 (L) 11/20/2015 0809   BUN 16 07/29/2016  0850   BUN 21.3 11/20/2015 0809   CREATININE 0.9 07/29/2016 0850   CREATININE 2.8 (H) 11/20/2015 0809      Component Value Date/Time   CALCIUM 9.4 07/29/2016 0850   CALCIUM 8.5 11/20/2015 0809   ALKPHOS 97 (H) 07/29/2016 0850   ALKPHOS 109 11/20/2015 0809   AST 57 (H) 07/29/2016 0850   AST 25 11/20/2015 0809   ALT 30 07/29/2016 0850   ALT 14 11/20/2015 0809   BILITOT 0.70 07/29/2016 0850   BILITOT 0.56 11/20/2015 0809         Impression and Plan: Ms. Wiegert is A 75 year old white female. She has a polycythemia vera. She is JAK2 positive.  Her blood counts look  pretty stable from my point of view.  I still think that her big problem will be her diabetes. I just don't know how aggressive she is in trying to manage this.  She clearly has bad candidiasis under her breasts.. I think she'll have to try systemic therapy for this. She has been tried on topical therapy without any success.  I will give her an IV dose of Diflucan in the office. I will then put her on oral Diflucan for 10 days. Hopefully this will control and get rid of the candidiasis.   I will like to see her back in another month or so. She really is miserable from this candidiasis.  Volanda Napoleon, MD 11/24/201712:31 PM

## 2016-09-10 NOTE — Progress Notes (Signed)
Patient received 200cc NS with procedure. 50cc prior and 150 cc after diflucan

## 2016-09-10 NOTE — Patient Instructions (Signed)
Fluconazole injection What is this medicine? FLUCONAZOLE (floo KON na zole) is an antifungal medicine. It is used to treat or prevent certain kinds of fungal or yeast infections. This medicine may be used for other purposes; ask your health care provider or pharmacist if you have questions. COMMON BRAND NAME(S): Diflucan What should I tell my health care provider before I take this medicine? They need to know if you have any of these conditions: -history of irregular heart beat -kidney disease -an unusual or allergic reaction to fluconazole, other antifungal medicines, foods, dyes or preservatives -pregnant or trying to get pregnant -breast-feeding How should I use this medicine? This medicine is for injection into a vein. It is usually given by a health care professional in a hospital or clinic setting. If you get this medicine at home, you will be taught how to prepare and give this medicine. Use exactly as directed. Take your medicine at regular intervals. Do not take your medicine more often than directed. It is important that you put your used needles and syringes in a special sharps container. Do not put them in a trash can. If you do not have a sharps container, call your pharmacist or healthcare provider to get one. Talk to your pediatrician regarding the use of this medicine in children. Special care may be needed. Overdosage: If you think you have taken too much of this medicine contact a poison control center or emergency room at once. NOTE: This medicine is only for you. Do not share this medicine with others. What if I miss a dose? This does not apply. What may interact with this medicine? Do not take this medicine with any of the following medications: -cisapride -pimozide -red yeast rice This medicine may also interact with the following medications: -birth control pills -cyclosporine -diuretics like hydrochlorothiazide -medicines for diabetes that are taken by  mouth -medicines for high cholesterol like atorvastatin, lovastatin or simvastatin -phenytoin -ramelteon -rifabutin -rifampin -some medicines for anxiety or sleep -tacrolimus -terfenadine -theophylline -tofacitinib -warfarin This list may not describe all possible interactions. Give your health care provider a list of all the medicines, herbs, non-prescription drugs, or dietary supplements you use. Also tell them if you smoke, drink alcohol, or use illegal drugs. Some items may interact with your medicine. What should I watch for while using this medicine? Tell your doctor if your symptoms do not improve. If you are taking this medicine for a long time you may need blood work. Some fungal infections need many weeks or months of treatment to cure completely. Alcohol can increase possible damage to your liver from this medicine. Avoid alcoholic drinks. What side effects may I notice from receiving this medicine? Side effects that you should report to your doctor or health care professional as soon as possible: -allergic reactions like skin rash or itching, hives, swelling of the lips, mouth, tongue, or throat -dark urine -feeling dizzy or faint -irregular heartbeat or chest pain -pain, redness at site of injection -redness, blistering, peeling or loosening of the skin, including inside the mouth -stomach pain -trouble breathing -unusual bruising or bleeding -vomiting -yellowing of the eyes or skin Side effects that usually do not require medical attention (report to your doctor or health care professional if they continue or are bothersome): -changes in how food tastes -diarrhea -headache -stomach upset, nausea This list may not describe all possible side effects. Call your doctor for medical advice about side effects. You may report side effects to FDA at 1-800-FDA-1088. Where should  I keep my medicine? Keep out of the reach of children. If you are using this medicine at home, you  will be instructed on how to store this medicine. Throw away any unused medicine after the expiration date on the label. NOTE: This sheet is a summary. It may not cover all possible information. If you have questions about this medicine, talk to your doctor, pharmacist, or health care provider.  2017 Elsevier/Gold Standard (2013-05-12 15:51:41)

## 2016-09-13 ENCOUNTER — Other Ambulatory Visit: Payer: Self-pay

## 2016-09-13 DIAGNOSIS — I10 Essential (primary) hypertension: Secondary | ICD-10-CM

## 2016-09-13 MED ORDER — LOSARTAN POTASSIUM 100 MG PO TABS: 100.0000 mg | ORAL_TABLET | Freq: Every day | ORAL | 0 refills | Status: DC

## 2016-09-15 ENCOUNTER — Telehealth: Payer: Self-pay | Admitting: Cardiovascular Disease

## 2016-09-15 ENCOUNTER — Other Ambulatory Visit: Payer: Self-pay | Admitting: *Deleted

## 2016-09-15 MED ORDER — ROSUVASTATIN CALCIUM 20 MG PO TABS: 20.0000 mg | ORAL_TABLET | Freq: Every day | ORAL | 0 refills | Status: DC

## 2016-09-15 NOTE — Telephone Encounter (Signed)
[name] [name] calling requesting a refill on Crestor 20 mg tablet, be sent to [facility] on [address]. Pt is out of medication. Please advise

## 2016-09-16 ENCOUNTER — Telehealth: Payer: Self-pay | Admitting: Internal Medicine

## 2016-09-16 ENCOUNTER — Other Ambulatory Visit: Payer: Self-pay | Admitting: Cardiovascular Disease

## 2016-09-16 MED ORDER — ROSUVASTATIN CALCIUM 20 MG PO TABS: 20.0000 mg | ORAL_TABLET | Freq: Every day | ORAL | 0 refills | Status: DC

## 2016-09-16 NOTE — Telephone Encounter (Signed)
Pt's Rx was sent to pt's pharmacy as requested. Confirmation received.

## 2016-09-16 NOTE — Telephone Encounter (Signed)
Patient need refill of insulin regular human CONCENTRATED (HUMULIN R U-500 KWIKPEN) 500 UNIT/ML [facility] [phone]

## 2016-09-16 NOTE — Addendum Note (Signed)
Addended by: Derl Barrow on: 09/16/2016 09:48 AM   Modules accepted: Orders

## 2016-09-17 ENCOUNTER — Other Ambulatory Visit: Payer: Self-pay

## 2016-09-17 MED ORDER — INSULIN REGULAR HUMAN (CONC) 500 UNIT/ML ~~LOC~~ SOPN: PEN_INJECTOR | SUBCUTANEOUS | 1 refills | Status: DC

## 2016-09-17 NOTE — Telephone Encounter (Signed)
FAXED TO NUMBER PROVIDED.

## 2016-09-20 ENCOUNTER — Other Ambulatory Visit: Payer: Self-pay | Admitting: Cardiovascular Disease

## 2016-09-21 NOTE — Telephone Encounter (Signed)
Rx has been sent to the pharmacy electronically. ° °

## 2016-09-22 ENCOUNTER — Other Ambulatory Visit: Payer: Self-pay | Admitting: Cardiovascular Disease

## 2016-09-27 NOTE — Telephone Encounter (Signed)
Returned call. No answer.

## 2016-09-27 NOTE — Telephone Encounter (Signed)
Calling on the status of last message, Please fax to [facility]

## 2016-09-28 NOTE — Telephone Encounter (Signed)
Patient called stated he did not understand message on voice mail. please advise

## 2016-09-29 NOTE — Telephone Encounter (Signed)
Called patient. No answer. No message left.

## 2016-10-07 ENCOUNTER — Telehealth: Payer: Self-pay | Admitting: Internal Medicine

## 2016-10-07 NOTE — Telephone Encounter (Signed)
Pt husband called in and wanted to verify the Humulin R was sent to [facility] at [phone]

## 2016-10-12 ENCOUNTER — Ambulatory Visit (HOSPITAL_BASED_OUTPATIENT_CLINIC_OR_DEPARTMENT_OTHER): Payer: Medicare Other | Admitting: Family

## 2016-10-12 ENCOUNTER — Other Ambulatory Visit (HOSPITAL_BASED_OUTPATIENT_CLINIC_OR_DEPARTMENT_OTHER): Payer: Medicare Other

## 2016-10-12 VITALS — BP 177/67 | HR 90 | Temp 97.9°F | Resp 20 | Wt 226.0 lb

## 2016-10-12 DIAGNOSIS — D45 Polycythemia vera: Secondary | ICD-10-CM

## 2016-10-12 DIAGNOSIS — B372 Candidiasis of skin and nail: Secondary | ICD-10-CM

## 2016-10-12 LAB — CBC WITH DIFFERENTIAL (CANCER CENTER ONLY)
HEMATOCRIT: 37.6 % (ref 34.8–46.6)
HEMOGLOBIN: 12.2 g/dL (ref 11.6–15.9)
MCH: 27.7 pg (ref 26.0–34.0)
MCHC: 32.4 g/dL (ref 32.0–36.0)
MCV: 85 fL (ref 81–101)
Platelets: 686 10*3/uL — ABNORMAL HIGH (ref 145–400)
RBC: 4.41 10*6/uL (ref 3.70–5.32)
RDW: 18.3 % — ABNORMAL HIGH (ref 11.1–15.7)
WBC: 27.7 10*3/uL — AB (ref 3.9–10.0)

## 2016-10-12 LAB — COMPREHENSIVE METABOLIC PANEL
ALT: 16 U/L (ref 0–55)
ANION GAP: 10 meq/L (ref 3–11)
AST: 42 U/L — ABNORMAL HIGH (ref 5–34)
Albumin: 3.6 g/dL (ref 3.5–5.0)
Alkaline Phosphatase: 113 U/L (ref 40–150)
BUN: 12.1 mg/dL (ref 7.0–26.0)
CHLORIDE: 103 meq/L (ref 98–109)
CO2: 23 meq/L (ref 22–29)
Calcium: 9.4 mg/dL (ref 8.4–10.4)
Creatinine: 1.3 mg/dL — ABNORMAL HIGH (ref 0.6–1.1)
EGFR: 41 mL/min/{1.73_m2} — AB (ref >90)
Glucose: 443 mg/dl — ABNORMAL HIGH (ref 70–140)
Potassium: 4.1 mEq/L (ref 3.5–5.1)
Sodium: 135 mEq/L — ABNORMAL LOW (ref 136–145)
Total Bilirubin: 1.03 mg/dL (ref 0.20–1.20)
Total Protein: 6.8 g/dL (ref 6.4–8.3)

## 2016-10-12 LAB — MANUAL DIFFERENTIAL (CHCC SATELLITE)
ALC: 3.9 10*3/uL — AB (ref 0.6–2.2)
ANC (CHCC HP manual diff): 21.6 10*3/uL — ABNORMAL HIGH (ref 1.5–6.7)
Band Neutrophils: 3 % (ref 0–10)
EOS: 5 % (ref 0–7)
LYMPH: 14 % (ref 14–48)
MONO: 3 % (ref 0–13)
Metamyelocytes: 1 % — ABNORMAL HIGH (ref 0–0)
PLT EST ~~LOC~~: INCREASED
SEG: 74 % (ref 40–75)

## 2016-10-12 LAB — IRON AND TIBC
%SAT: 12 % — AB (ref 21–57)
IRON: 44 ug/dL (ref 41–142)
TIBC: 358 ug/dL (ref 236–444)
UIBC: 313 ug/dL (ref 120–384)

## 2016-10-12 LAB — FERRITIN: FERRITIN: 36 ng/mL (ref 9–269)

## 2016-10-12 LAB — LACTATE DEHYDROGENASE: LDH: 361 U/L — ABNORMAL HIGH (ref 125–245)

## 2016-10-12 NOTE — Progress Notes (Signed)
Hematology and Oncology Follow Up Visit  DALIYAH SRAMEK [id number] 12-29-1940 75 y.o. 10/12/2016   Principle Diagnosis:  [name]-JAK2 (+)  Current Therapy:   Jakafi 67m po BID    Interim History:  Ms. KClareis here today for follow-up. She is doing fairly well. Unfortunately, she still has the rash under both breasts. She has tried several creams and IV and oral Diflucan without relief. She plans to follow-up with her PCP for referral to dermatology.  She is having fatigue and napping quite a bit.  She verbalized that she is taking her Jakafi 25 mg PO BID as prescribed. Her platelet count is up at 686 and WBC count is 27.7.  Her blood sugars are still uncontrolled. She states that she is taking her insulin and Invokana as prescribed.  She has SOB and dizziness with over exertion since her heart attack 2 years ago. This is unchanged.  No fever, chills, n/v, cough, rash, chest pain, headache, palpitations, abdominal pain or changes in bowel or bladder habits. She has chronic diarrhea and takes lomotil as needed.  She has mild puffiness in her feet and ankles that comes and goes. She elevates her legs which helps.  Her appetite comes and goes. She is able to stay hydrated and her weight is stable.   Medications:  Allergies as of 10/12/2016      Reactions   Lisinopril Cough   Tape Hives   Doxycycline Hives, Swelling, Rash   Latex Hives, Itching, Rash      Medication List       Accurate as of 10/12/16 11:53 AM. Always use your most recent med list.          amLODipine 5 MG tablet Commonly known as:  NORVASC Take 1 tablet (5 mg total) by mouth daily.   calcium carbonate 600 MG tablet Commonly known as:  OS-CAL Take 2 tablets (1,200 mg total) by mouth daily.   canagliflozin 100 MG Tabs tablet Commonly known as:  INVOKANA Take 1 tablet (100 mg total) by mouth daily.   cholecalciferol 1000 units tablet Commonly known as:  VITAMIN D Take 1 tablet (1,000 Units  total) by mouth daily.   dicyclomine 10 MG capsule Commonly known as:  BENTYL Take 1 tab by mouth every morning, may take twice daily as needed.   diphenoxylate-atropine 2.5-0.025 MG tablet Commonly known as:  LOMOTIL Take 1 tablet by mouth 4 (four) times daily as needed for diarrhea or loose stools.   fluconazole 100 MG tablet Commonly known as:  DIFLUCAN Take 1 tablet (100 mg total) by mouth daily.   furosemide 20 MG tablet Commonly known as:  LASIX Take 1 tablet (20 mg total) by mouth daily.   hydrochlorothiazide 25 MG tablet Commonly known as:  HYDRODIURIL Take 25 mg by mouth daily.   insulin regular human CONCENTRATED 500 UNIT/ML kwikpen Commonly known as:  HUMULIN R U-500 KWIKPEN Inject 100 units under the skin before breakfast and 85 units before dinner.   loperamide 2 MG tablet Commonly known as:  IMODIUM A-D Take 1-2 tablets (2-4 mg total) by mouth daily as needed for diarrhea or loose stools.   losartan 100 MG tablet Commonly known as:  COZAAR Take 1 tablet (100 mg total) by mouth daily.   nitroGLYCERIN 0.4 MG SL tablet Commonly known as:  NITROSTAT Place 1 tablet (0.4 mg total) under the tongue every 5 (five) minutes as needed for chest pain.   NONFORMULARY OR COMPOUNDED ITEM Neuropathy cream: mix Amitriptyline  2%, Lidocaine 2%, and Ketamine 1% - apply on feet 2x a day [facility].   omega-3 acid ethyl esters 1 g capsule Commonly known as:  LOVAZA Take 1 capsule (1 g total) by mouth 2 (two) times daily.   ondansetron 4 MG disintegrating tablet Commonly known as:  ZOFRAN ODT Take 1 tablet (4 mg total) by mouth every 8 (eight) hours as needed for nausea.   ONETOUCH VERIO test strip Generic drug:  glucose blood Use to test blood sugar 3 times daily.   promethazine 25 MG tablet Commonly known as:  PHENERGAN Take 0.5 tablets (12.5 mg total) by mouth every 6 (six) hours as needed for nausea or vomiting.   rosuvastatin 20 MG tablet Commonly known  as:  CRESTOR Take 1 tablet (20 mg total) by mouth daily. Need appointment   CRESTOR 20 MG tablet Generic drug:  rosuvastatin TAKE 1 TABLET (20 MG TOTAL) BY MOUTH DAILY.   CRESTOR 20 MG tablet Generic drug:  rosuvastatin TAKE 1 TABLET (20 MG TOTAL) BY MOUTH DAILY.   CRESTOR 20 MG tablet Generic drug:  rosuvastatin TAKE 1 TABLET (20 MG TOTAL) BY MOUTH DAILY.   CRESTOR 20 MG tablet Generic drug:  rosuvastatin TAKE 1 TABLET (20 MG TOTAL) BY MOUTH DAILY.   ruxolitinib phosphate 25 MG tablet Commonly known as:  JAKAFI Take 1 tablet (25 mg total) by mouth 2 (two) times daily.   ticagrelor 60 MG Tabs tablet Commonly known as:  BRILINTA Take 1 tablet (60 mg total) by mouth 2 (two) times daily.       Allergies:  Allergies  Allergen Reactions  . Lisinopril Cough  . Tape Hives  . Doxycycline Hives, Swelling and Rash  . Latex Hives, Itching and Rash    Past Medical History, Surgical history, Social history, and Family History were reviewed and updated.  Review of Systems: All other 10 point review of systems is negative.   Physical Exam:  vitals were not taken for this visit.  Wt Readings from Last 3 Encounters:  09/10/16 231 lb (104.8 kg)  07/29/16 226 lb 1.9 oz (102.6 kg)  06/07/16 226 lb (102.5 kg)    Ocular: Sclerae unicteric, pupils equal, round and reactive to light Ear-nose-throat: Oropharynx clear, dentition fair Lymphatic: No cervical supraclavicular or axillary adenopathy Lungs no rales or rhonchi, good excursion bilaterally Heart regular rate and rhythm, no murmur appreciated Abd soft, nontender, positive bowel sounds, no liver or spleen tip palpated on exam MSK no focal spinal tenderness, no joint edema Neuro: non-focal, well-oriented, appropriate affect Breasts: Deferred  Lab Results  Component Value Date   WBC 21.9 (H) 09/10/2016   HGB 11.6 09/10/2016   HCT 35.6 09/10/2016   MCV 87 09/10/2016   PLT 462 (H) 09/10/2016   Lab Results  Component  Value Date   FERRITIN 66 06/07/2016   IRON 64 06/07/2016   TIBC 323 06/07/2016   UIBC 258 06/07/2016   IRONPCTSAT 20 (L) 06/07/2016   Lab Results  Component Value Date   RETICCTPCT 2.4 01/30/2014   RBC 4.09 09/10/2016   No results found for: KPAFRELGTCHN, LAMBDASER, KAPLAMBRATIO Lab Results  Component Value Date   IGA 233 03/29/2014   No results found for: TOTALPROTELP, ALBUMINELP, A1GS, A2GS, BETS, BETA2SER, GAMS, MSPIKE, SPEI   Chemistry      Component Value Date/Time   NA 136 09/10/2016 0857   K 4.1 09/10/2016 0857   CL 105 07/29/2016 0850   CL 103 02/16/2013 0900   CO2 21 (  L) 09/10/2016 0857   BUN 12.6 09/10/2016 0857   CREATININE 1.0 09/10/2016 0857      Component Value Date/Time   CALCIUM 9.0 09/10/2016 0857   ALKPHOS 115 09/10/2016 0857   AST 53 (H) 09/10/2016 0857   ALT 21 09/10/2016 0857   BILITOT 1.04 09/10/2016 0857      Impression and Plan: Ms. Aziz is A 75 yo white female with polycythemia vera, JAK2 positive. She is taking her Jakafi 25 mg PO BID as prescribed. Her WBC count and platelets have increased. No anemia at this time.  Her blood sugars are still out of control and I have routed today's lab work to her PCP. She still has the rash under both breasts and plans to request a dermatology referral from her PCP.  I spoke with Dr. [name] and we will continue to monitor her lab work for now and leave her medication the same. We will plan to see her back in 1 month.  She will contact our office with any questions or concerns. We can certainly see her sooner if need be.   Eliezer Bottom, NP 12/26/201711:53 AM

## 2016-10-15 NOTE — Telephone Encounter (Signed)
Humulin R-500 was faxed to [facility] at [phone]

## 2016-10-19 ENCOUNTER — Telehealth: Payer: Self-pay | Admitting: Internal Medicine

## 2016-10-19 NOTE — Telephone Encounter (Signed)
Patient need copies of all the medication request that was [facility] concerning [facility] medications.  Please advise.

## 2016-10-20 ENCOUNTER — Encounter: Payer: Self-pay | Admitting: Internal Medicine

## 2016-10-20 ENCOUNTER — Ambulatory Visit (INDEPENDENT_AMBULATORY_CARE_PROVIDER_SITE_OTHER): Payer: Medicare Other | Admitting: Internal Medicine

## 2016-10-20 ENCOUNTER — Ambulatory Visit: Payer: Medicare Other | Admitting: Internal Medicine

## 2016-10-20 VITALS — BP 124/74 | HR 86 | Ht 64.0 in | Wt 227.0 lb

## 2016-10-20 DIAGNOSIS — E1140 Type 2 diabetes mellitus with diabetic neuropathy, unspecified: Secondary | ICD-10-CM

## 2016-10-20 DIAGNOSIS — E1165 Type 2 diabetes mellitus with hyperglycemia: Secondary | ICD-10-CM

## 2016-10-20 DIAGNOSIS — Z794 Long term (current) use of insulin: Secondary | ICD-10-CM | POA: Diagnosis not present

## 2016-10-20 DIAGNOSIS — IMO0002 Reserved for concepts with insufficient information to code with codable children: Secondary | ICD-10-CM

## 2016-10-20 NOTE — Progress Notes (Signed)
Patient ID: Amanda [name], female   DOB: November 19, 1940, 76 y.o.   MRN: [id number]  HPI: Amanda [name] is a 76 y.o.-year-old female, returning for f/u for DM2, dx 2008, insulin-dependent since dx, uncontrolled, with complications (DR, PN, CAD). Last visit 7 mo ago.  She is seeing Dr. [name] for her [place]. She has had increasing WBC count before. Had a negative BM Bx.  Last hemoglobin A1c was: 02/11/2016: HbA1c from  fructosamine is 7.5% Lab Results  Component Value Date   HGBA1C 9.6 10/28/2015   HGBA1C 8.8 (H) 01/27/2015   HGBA1C 9.8 (H) 05/16/2014  07/01/2015: HbA1c from fructosamine 6.57%. 01/27/2015: HbA1c from fructosamine: 6.57% 05/16/2014: HbA1c from fructosamine: 7.95%. - in 10/16/2012, hemoglobin A1c was 6.9% - in 07/2012, hemoglobin A1c was 9.9% - in 11/16/2010, hemoglobin A1c was 12.8% - in 03/2011, hemoglobin A1c was 6.9% - In 07/10/2010 her hemoglobin A1c was 10.5% C-peptide was 6.3 and 01/2011.  Pt was on a regimen of: - Levemir 45 units 2x a day, in am and at bedtime - Humalog mealtime: 40 units with a smaller meal 45 units with a larger meal  - Sliding Scale of Humalog:  - 150- 165: + 1 unit  - 166- 180: + 2 units  - 181- 195: + 3 units  - 196- 210: + 4 units  - 211- 225: + 5 units  - 226- 240: + 6 units  - >240: + 7 units  She could not tolerate doses of metformin XR higher than 250 mg twice a day in the past due to diarrhea. She does not want to resume this. Was previously on the Victoza 1.8 mg daily - "made me sick". She was previously on Janumet between 2012-2013. She was on Invokana - but could not afford it while in the Medicare doughnut hole.  Now on U500 pens: - 100 units before b'fast (may be 7 am to 11 am) - 85 units before dinner Stopped Invokana 100 mg 2 mo ago b/c of negative publicity.  She checks sugars 2-3x a day - but meter broke in last 2 days: - am: 96, 128-206, 291 >> 170-200 >> 118-227, 276, 478 >> 110-221 >> 70s, 120-150, 200s with  Christmas - 2h after b'fast: n/c >> 238 >> n/c >> 167 >> n/c - before lunch: 167-343 >> 175-340 >> 238 >> n/c >> 128-235, 418, 499 >> 69, 90-198, 250 >> n/c - 2h after lunch: 240-312 >> n/c >> 270-456 >> 112-218 >> n/c >> 108-447 >> 171-175 >> 130-150s, 280 (Thanksgiving) - before dinner: 58, 66, 89-157, 203, 224 (318 - Holiday) >> 150-160 >> 162-386, 592 >> 75-192 >> 120-140s - bedtime: 152-306 >> n/c >> 161-326 >> 164, 240, 240, 268 >> 273 >> n/c >> 180-220 No lows. Lowest sugar was 69x1>> 70s; she has hypoglycemia awareness at 100.  Highest 400s when sick >> 280.  She was seen for nutritional advice and diabetes education, with last visit on 04/2012 - [name].  - no CKD, last BUN/creatinine:  Lab Results  Component Value Date   BUN 12.1 10/12/2016   CREATININE 1.3 (H) 10/12/2016  She is on ACEI.  - last set of lipids - per PCP - no records available. Latest Lipid panel available for review: Lab Results  Component Value Date   CHOL 168 01/27/2015   HDL 27 (L) 01/27/2015   LDLCALC 88 01/27/2015   TRIG 266 (H) 01/27/2015   CHOLHDL 6.2 01/27/2015  She is on Pravastatin. - last eye exam  was in 07/2013. 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 She cannot afford the copay for another eye exam. - no numbness and tingling in her feet. For her peripheral neuropathy >> saw PCP, podiatrist, orthopedist >> tried capsaicin cream, compression stockings, then Neurontin >> on 600 mg 3x a day. She is on aspirin 81 mg daily.  She also has a history of hypertension, diastolic dysfunction, hyperlipidemia, polycytemia vera, gout, psoriasis, depression, history of Bell's palsy, obesity, vitamin D deficiency. On 01/27/2015 she had an AMI >> had 2 stents placed.   ROS: Constitutional: no weight gain, no fatigue Eyes: no blurry vision, no xerophthalmia ENT: no sore throat, no nodules palpated in throat, no dysphagia/odynophagia, + hoarseness Cardiovascular: no  CP/palpitations/ leg swelling Respiratory:no cough/SOB Gastrointestinal: no N/V/D/C Musculoskeletal: no muscle/joint aches Skin: + rash under breasts >> will see dermatology Neurological: no tremors/numbness/tingling/dizziness  I reviewed pt's medications, allergies, PMH, social hx, family hx, and changes were documented in the history of present illness. Otherwise, unchanged from my initial visit note.  PE: BP 124/74 (BP Location: Right Arm, Patient Position: Sitting)   Pulse 86   Ht 5' 4"  (1.626 m)   Wt 227 lb (103 kg)   SpO2 93%   BMI 38.96 kg/m  Body mass index is 38.96 kg/m. Wt Readings from Last 3 Encounters:  10/20/16 227 lb (103 kg)  10/12/16 226 lb (102.5 kg)  09/10/16 231 lb (104.8 kg)   Constitutional: overweight, in NAD Eyes: PERRLA, EOMI, no exophthalmos ENT: moist mucous membranes, no thyromegaly, no cervical lymphadenopathy Cardiovascular: RRR, No MRG, no leg swelling Respiratory: CTA B Gastrointestinal: abdomen soft, NT, ND, BS+ Musculoskeletal: no deformities, strength intact in all 4 Skin: moist, warm  ASSESSMENT: 1. 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 Diabetic peripheral neuropathy  PLAN:  1. Patient with long-standing, uncontrolled diabetes, on U500 insulin, with better CBGs despite stopping Invokana. Will advise her to continue off the med. She has higher sugars at bedtime 2/2 snacks at night (e.g. Icecream). I advised her that she may need a low dose insulin before such snacks, but OTW, will not change the regimen. - Last fructosamine reviewed >> calculated HbA1c 7.5%, higher. - I advised her to: Patient Instructions  Please continue: - U500 100 units before b'fast and 85 units before dinner.  If you have a snack after dinner, you may need to take 5 units of U500 before this.  Please return in 3 months with your sugar log.   - check 3 times a day, rotating checks  - needs  a new eye exam (Dr. Zigmund Daniel)  >> again advised to schedule - will check a  Fructosamine - The HbA1c is unreliable and should not be checked. - had flu vaccine this season - Return to clinic in 3 months with sugar log or meter  - given a new One Touch Verio Flex meter  2. Diabetic peripheral neuropathy - on Neurontin to 600 mg 3 times a day - on neuropathy cream: mix Amitriptyline 2%, Lidocaine 2%, and Ketamine 1% - apply on feet 2x a day  [name], MD PhD [facility] Endocrin[facility]

## 2016-10-20 NOTE — Patient Instructions (Signed)
Please continue: - U500 100 units before b'fast and 85 units before dinner.  If you have a snack after dinner, you may need to take 5 units of U500 before this.  Please return in 3 months with your sugar log.

## 2016-10-21 NOTE — Telephone Encounter (Signed)
Amanda [name] from [facility] call on the status of last message.  Please fax to [phone]

## 2016-10-22 NOTE — Telephone Encounter (Signed)
Faxed already to [facility]. Faxed over on Tuesday.

## 2016-10-22 NOTE — Telephone Encounter (Signed)
Husband has confirmation that it went through.

## 2016-10-27 NOTE — Telephone Encounter (Signed)
[facility] called to check on the status of the fax for this patient.

## 2016-10-27 NOTE — Telephone Encounter (Signed)
Pt states as of now 4:24 pm, [employer] still does not have the paperwork.please try again but make a copy and put at the front desk for the pt's husband to pick up and he is gonna see if he can get it thru

## 2016-10-27 NOTE — Telephone Encounter (Signed)
[facility] calling to see if we resent the refill request for the pt's Humulin R # [phone] opt 4

## 2016-10-27 NOTE — Telephone Encounter (Signed)
Just refaxed and fixed the dosage. Will await for confirmation.

## 2016-10-28 NOTE — Telephone Encounter (Signed)
Faxed again and received confirmation. Copy left up front for husband to pick up.

## 2016-11-02 ENCOUNTER — Encounter: Payer: Self-pay | Admitting: Physician Assistant

## 2016-11-02 ENCOUNTER — Ambulatory Visit (HOSPITAL_BASED_OUTPATIENT_CLINIC_OR_DEPARTMENT_OTHER)
Admission: RE | Admit: 2016-11-02 | Discharge: 2016-11-02 | Disposition: A | Discharge status: Home / Self Care | Payer: Medicare Other | Source: Ambulatory Visit | Attending: Physician Assistant | Admitting: Physician Assistant

## 2016-11-02 ENCOUNTER — Ambulatory Visit (INDEPENDENT_AMBULATORY_CARE_PROVIDER_SITE_OTHER): Payer: Medicare Other | Admitting: Physician Assistant

## 2016-11-02 VITALS — BP 168/62 | HR 108 | Ht 64.0 in | Wt 228.0 lb

## 2016-11-02 DIAGNOSIS — M79661 Pain in right lower leg: Secondary | ICD-10-CM | POA: Insufficient documentation

## 2016-11-02 DIAGNOSIS — M25571 Pain in right ankle and joints of right foot: Secondary | ICD-10-CM

## 2016-11-02 DIAGNOSIS — M79662 Pain in left lower leg: Secondary | ICD-10-CM | POA: Insufficient documentation

## 2016-11-02 DIAGNOSIS — T148XXA Other injury of unspecified body region, initial encounter: Secondary | ICD-10-CM | POA: Diagnosis not present

## 2016-11-02 DIAGNOSIS — M25572 Pain in left ankle and joints of left foot: Secondary | ICD-10-CM

## 2016-11-02 NOTE — Progress Notes (Signed)
Discussed with patient results.  Symptomatic care discussed.  Follow up as needed.

## 2016-11-02 NOTE — Progress Notes (Addendum)
   Subjective:    Patient ID: Amanda [name], female    DOB: October 11, 1941, 76 y.o.   MRN: [id number]  HPI  Patient is a 76yo female who presents to the clinic with right leg bruising and pain.  Patient reports her right leg started to "ache and throb" for the 2 weeks, waxing and waning, improved by walking.  She noticed the bruising yesterday.  Patient rates the pain 8/10.  Denies any recent travel, immobilization, or surgeries.  Denies hemoptysis.  Patient states she is short of breath but she feels it is her baseline after mild exertion.  Patient denies a history of DVT or pulmonary embolism.  Patient states she did not take any of her medications today because she was worried it would make the bruising worse.       Review of Systems  Hematological: Bruises/bleeds easily (3 bruises on calf).  All other systems reviewed and are negative.      Objective:   Physical Exam  Constitutional: She is oriented to person, place, and time. She appears well-developed and well-nourished.  HENT:  Head: Normocephalic and atraumatic.  Cardiovascular: Normal rate, regular rhythm and normal heart sounds.   Pulmonary/Chest:  Labored breathing.   Crackles in bilateral lungs.  Diminished breath sounds in lower lobes.   Musculoskeletal: Normal range of motion. She exhibits edema and tenderness.  Right knee evaluation: 3 bruises on medial/popliteal region of knee.    Right calf circumference: 36cm Left calf circumference 35.5cm  Tenderness when squeezing calf.   Edema on medical aspect of ankle.   Positive homan's sign.   Cap refill: less than 2 seconds bilaterally.    Neurological: She is alert and oriented to person, place, and time.  Skin: Skin is warm and dry.          Assessment & Plan:  Marland KitchenMarland KitchenDiagnoses and all orders for this visit:  Bruise -     CBC w/Diff/Platelet -     Cancel: US Venous Img Lower Bilateral; Future  Pain of right calf -     US Venous Img Lower Unilateral Right;  Future  Right ankle pain, unspecified chronicity -     US Venous Img Lower Unilateral Right; Future   Due to this patient's unilateral leg edema, tenderness, positive homan's sign, and bruising a DVT needs to be ruled out in her right calf.   Well's criteria score: 4.5 (moderate risk group)   A CBC with diff/platelets was ordered to check her platelets since she is having increased bruising.  Patient can continue taking her brilinta 40m and aspirin 87m  Patient informed to take her daily medications as prescribed.   Discussed with patient pending ultrasound results, may need to proceed with a chest CTA to rule out pulmonary embolism.

## 2016-11-02 NOTE — Telephone Encounter (Signed)
LM letting the pt know that her Humulin R is here for pick up

## 2016-11-04 ENCOUNTER — Other Ambulatory Visit: Payer: Self-pay | Admitting: Cardiovascular Disease

## 2016-11-05 NOTE — Telephone Encounter (Signed)
Rx has been sent to the pharmacy electronically. ° °

## 2016-11-09 ENCOUNTER — Encounter: Payer: Self-pay | Admitting: *Deleted

## 2016-11-13 ENCOUNTER — Encounter (HOSPITAL_COMMUNITY): Payer: Self-pay | Admitting: *Deleted

## 2016-11-13 ENCOUNTER — Emergency Department (HOSPITAL_COMMUNITY)
Admission: EM | Admit: 2016-11-13 | Discharge: 2016-11-13 | Disposition: A | Discharge status: Home / Self Care | Payer: Medicare Other | Attending: Emergency Medicine | Admitting: Emergency Medicine

## 2016-11-13 DIAGNOSIS — Z87891 Personal history of nicotine dependence: Secondary | ICD-10-CM | POA: Insufficient documentation

## 2016-11-13 DIAGNOSIS — I10 Essential (primary) hypertension: Secondary | ICD-10-CM | POA: Diagnosis not present

## 2016-11-13 DIAGNOSIS — N939 Abnormal uterine and vaginal bleeding, unspecified: Secondary | ICD-10-CM | POA: Diagnosis present

## 2016-11-13 DIAGNOSIS — E119 Type 2 diabetes mellitus without complications: Secondary | ICD-10-CM | POA: Diagnosis not present

## 2016-11-13 DIAGNOSIS — Z91040 Latex allergy status: Secondary | ICD-10-CM | POA: Insufficient documentation

## 2016-11-13 DIAGNOSIS — Z794 Long term (current) use of insulin: Secondary | ICD-10-CM | POA: Diagnosis not present

## 2016-11-13 DIAGNOSIS — N390 Urinary tract infection, site not specified: Secondary | ICD-10-CM | POA: Insufficient documentation

## 2016-11-13 DIAGNOSIS — I252 Old myocardial infarction: Secondary | ICD-10-CM | POA: Diagnosis not present

## 2016-11-13 DIAGNOSIS — R319 Hematuria, unspecified: Secondary | ICD-10-CM | POA: Diagnosis not present

## 2016-11-13 LAB — CBC
HEMATOCRIT: 31.2 % — AB (ref 36.0–46.0)
HEMOGLOBIN: 10 g/dL — AB (ref 12.0–15.0)
MCH: 27.3 pg (ref 26.0–34.0)
MCHC: 32.1 g/dL (ref 30.0–36.0)
MCV: 85.2 fL (ref 78.0–100.0)
Platelets: 534 10*3/uL — ABNORMAL HIGH (ref 150–400)
RBC: 3.66 MIL/uL — AB (ref 3.87–5.11)
RDW: 18.8 % — ABNORMAL HIGH (ref 11.5–15.5)
WBC: 19 10*3/uL — AB (ref 4.0–10.5)

## 2016-11-13 LAB — URINALYSIS, ROUTINE W REFLEX MICROSCOPIC
Bilirubin Urine: NEGATIVE
Glucose, UA: >=500 mg/dL — AB
Ketones, ur: NEGATIVE mg/dL
Nitrite: NEGATIVE
PROTEIN: 100 mg/dL — AB
SPECIFIC GRAVITY, URINE: 1.012 (ref 1.005–1.030)
pH: 5 (ref 5.0–8.0)

## 2016-11-13 LAB — BASIC METABOLIC PANEL
ANION GAP: 8 (ref 5–15)
BUN: 13 mg/dL (ref 6–20)
CHLORIDE: 103 mmol/L (ref 101–111)
CO2: 22 mmol/L (ref 22–32)
Calcium: 8.9 mg/dL (ref 8.9–10.3)
Creatinine, Ser: 0.96 mg/dL (ref 0.44–1.00)
GFR, EST NON AFRICAN AMERICAN: 56 mL/min — AB (ref >60)
Glucose, Bld: 379 mg/dL — ABNORMAL HIGH (ref 65–99)
POTASSIUM: 4.2 mmol/L (ref 3.5–5.1)
SODIUM: 133 mmol/L — AB (ref 135–145)

## 2016-11-13 MED ORDER — OXYCODONE-ACETAMINOPHEN 5-325 MG PO TABS
1.0000 | ORAL_TABLET | ORAL | Status: DC | PRN
  Administered 2016-11-13: 1 via ORAL
  Filled 2016-11-13: qty 1

## 2016-11-13 MED ORDER — CEPHALEXIN 500 MG PO CAPS: 500.0000 mg | ORAL_CAPSULE | Freq: Three times a day (TID) | ORAL | 0 refills | Status: DC

## 2016-11-13 MED ORDER — CEPHALEXIN 500 MG PO CAPS
500.0000 mg | ORAL_CAPSULE | Freq: Once | ORAL | Status: AC
  Administered 2016-11-13: 500 mg via ORAL
  Filled 2016-11-13: qty 1

## 2016-11-13 NOTE — ED Provider Notes (Signed)
[facility] DEPT Provider Note   CSN: [id number] Arrival date & time: 11/13/16  0018     History   Chief Complaint Chief Complaint  Patient presents with  . Vaginal Bleeding    HPI Amanda [name] is a 76 y.o. female.  The history is provided by the patient.  Hematuria  This is a new problem. The current episode started 6 to 12 hours ago. The problem occurs constantly. The problem has not changed since onset.Nothing aggravates the symptoms. Nothing relieves the symptoms. She has tried nothing for the symptoms.    Past Medical History:  Diagnosis Date  . Allergic rhinitis 01/23/2016  . C. difficile colitis   . Cancer ([place])    SKIN  . Candidiasis of skin 09/30/2014  . Cirrhosis ([place])   . Depression   . Diabetes mellitus 2008  . Gout   . Herpes simplex   . Hyperglycemia 05/31/2013  . Hyperlipidemia   . Hypertension   . Myocardial infarction   . Neuromuscular disorder ([place])    BELL PALSY  . Obesity   . Polycythemia    Dr. Elease Hashimoto- [facility] hematology  . Psoriasis     Patient Active Problem List   Diagnosis Date Noted  . Pain of right calf 11/02/2016  . Right ankle pain 11/02/2016  . Bruise 11/02/2016  . OSA (obstructive sleep apnea) 04/21/2016  . Osteopenia 04/07/2016  . Hypersomnolence 03/21/2016  . Poorly controlled diabetes mellitus ([place]) 03/21/2016  . Lung nodule, solitary 02/05/2016  . Aortic dilatation ([place]) 02/05/2016  . Dyspnea 01/28/2016  . Allergic rhinitis 01/23/2016  . Rib pain on left side 11/20/2015  . Hepatic cirrhosis ([place]) 10/28/2015  . Irritable bowel syndrome 08/12/2015  . Abdominal bloating 07/07/2015  . Anorexia 06/17/2015  . Obesity (BMI 30-39.9) 04/30/2015  . Diabetic retinopathy of both eyes ([place]) 04/18/2015  . Former smoker 04/18/2015  . GAD (generalized anxiety disorder) 04/18/2015  . Insulin dependent diabetes mellitus ([place]) 04/18/2015  . Status post primary angioplasty with coronary stent 04/18/2015  . Easy bruising 03/31/2015    . Coronary artery disease involving native coronary artery 03/01/2015  . Acute diastolic heart failure ([place]) 02/01/2015  . ST elevation myocardial infarction (STEMI) involving left circumflex coronary artery in recovery phase ([place]) 01/27/2015  . ST elevation myocardial infarction (STEMI) of inferior wall, initial episode of care ([place])   . Idioventricular rhythm ([place])   . Diabetic peripheral neuropathy associated with type 2 diabetes mellitus ([place]) 01/23/2015  . Type 2 diabetes mellitus with diabetic polyneuropathy ([place]) 01/23/2015  . Thrombocytosis ([place]) 07/04/2014  . Cholelithiasis 02/07/2014  . Chronic diarrhea 01/29/2014  . Edema 07/09/2013  . Constipation 07/09/2013  . Type 2 diabetes mellitus with neurological manifestations, uncontrolled ([place]) 06/07/2013  . Leukocytosis 06/02/2013  . [name] ([place]) 05/31/2013  . Essential hypertension 05/31/2013  . History of Bell's palsy 05/26/2013  . DERMATITIS, ATOPIC 11/16/2010  . DIZZINESS 11/16/2010  . WEIGHT GAIN 11/16/2010  . HERPETIC WHITLOW 06/30/2010  . DIASTOLIC DYSFUNCTION 82/99/3716  . Hyperlipidemia 12/15/2009  . Essential hypertension, benign 11/24/2009  . PSORIASIS 11/24/2009  . Depressive disorder, not elsewhere classified 09/13/2009  . Adiposity 07/08/2008  . CAFL (chronic airflow limitation) ([place]) 07/08/2008  . 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 BREAST SURGERY Left    milk duct  . COLONOSCOPY    . LEFT HEART CATHETERIZATION WITH CORONARY ANGIOGRAM N/A 01/27/2015   Procedure: LEFT HEART CATHETERIZATION WITH CORONARY  ANGIOGRAM;  Surgeon: [name], MD;  Location: [facility] CATH LAB;  Service: Cardiovascular;  Laterality: N/A;  . TONSILLECTOMY    . TOTAL ABDOMINAL HYSTERECTOMY W/ BILATERAL SALPINGOOPHORECTOMY     for heavy periods with appendectomy    OB History    No data available       Home Medications    Prior to  Admission medications   Medication Sig Start Date End Date Taking? Authorizing Provider  diphenoxylate-atropine (LOMOTIL) 2.5-0.025 MG tablet Take 1 tablet by mouth 4 (four) times daily as needed for diarrhea or loose stools. 06/24/16  Yes Tanna Furry, MD  insulin regular human CONCENTRATED (HUMULIN R U-500 KWIKPEN) 500 UNIT/ML kwikpen Inject 100 units under the skin before breakfast and 85 units before dinner. 09/17/16  Yes Renato Shin, MD  loperamide (IMODIUM A-D) 2 MG tablet Take 1-2 tablets (2-4 mg total) by mouth daily as needed for diarrhea or loose stools. 05/28/16  Yes Hali Marry, MD  losartan (COZAAR) 100 MG tablet Take 1 tablet (100 mg total) by mouth daily. 09/13/16  Yes Hali Marry, MD  nitroGLYCERIN (NITROSTAT) 0.4 MG SL tablet Place 1 tablet (0.4 mg total) under the tongue every 5 (five) minutes as needed for chest pain. 02/06/15  Yes [name], MD  NONFORMULARY OR COMPOUNDED ITEM Neuropathy cream: mix Amitriptyline 2%, Lidocaine 2%, and Ketamine 1% - apply on feet 2x a day [facility]. 01/23/15  Yes [name], MD  [facility] VERIO test strip Use to test blood sugar 3 times daily. 01/26/16  Yes Historical Provider, MD  rosuvastatin (CRESTOR) 20 MG tablet TAKE 1 TABLET(20 MG) BY MOUTH DAILY 11/05/16  Yes [name], MD  ruxolitinib phosphate (JAKAFI) 25 MG tablet Take 1 tablet (25 mg total) by mouth 2 (two) times daily. 06/02/16  Yes Volanda Napoleon, MD  ticagrelor (BRILINTA) 60 MG TABS tablet Take 1 tablet (60 mg total) by mouth 2 (two) times daily. 03/02/16  Yes [name], MD  amLODipine (NORVASC) 5 MG tablet Take 1 tablet (5 mg total) by mouth daily. Patient not taking: Reported on 11/13/2016 01/26/16   Hali Marry, MD  calcium carbonate (OS-CAL) 600 MG tablet Take 2 tablets (1,200 mg total) by mouth daily. Patient not taking: Reported on 11/13/2016 04/07/16   [name] Chase, DO  cholecalciferol (VITAMIN D) 1000 units tablet Take 1  tablet (1,000 Units total) by mouth daily. Patient not taking: Reported on 11/13/2016 04/07/16   [name] Chase, DO  dicyclomine (BENTYL) 10 MG capsule Take 1 tab by mouth every morning, may take twice daily as needed. Patient not taking: Reported on 11/13/2016 05/15/15   Amy S Esterwood, PA-C  furosemide (LASIX) 20 MG tablet Take 1 tablet (20 mg total) by mouth daily. Patient not taking: Reported on 11/13/2016 01/26/16   Hali Marry, MD  omega-3 acid ethyl esters (LOVAZA) 1 G capsule Take 1 capsule (1 g total) by mouth 2 (two) times daily. Patient not taking: Reported on 11/13/2016 02/07/15   [name], MD  ondansetron (ZOFRAN ODT) 4 MG disintegrating tablet Take 1 tablet (4 mg total) by mouth every 8 (eight) hours as needed for nausea. Patient not taking: Reported on 11/13/2016 06/24/16   Tanna Furry, MD  promethazine (PHENERGAN) 25 MG tablet Take 0.5 tablets (12.5 mg total) by mouth every 6 (six) hours as needed for nausea or vomiting. Patient not taking: Reported on 11/13/2016 06/08/16   Volanda Napoleon, MD  Family History Family History  Problem Relation Age of Onset  . Diabetes Mother   . Heart disease Mother     CAD  . Hyperlipidemia Mother   . Hypertension Mother   . Kidney disease Mother   . Kidney disease Father   . Breast cancer Maternal Aunt   . Heart disease Maternal Grandmother   . Heart disease Other     maternal aunts and uncles    Social History Social History  Substance Use Topics  . Smoking status: Former Smoker    Years: 48.00    Types: Cigarettes    Quit date: 10/18/1986  . Smokeless tobacco: Never Used  . Alcohol use No     Allergies   Lisinopril; Tape; Doxycycline; and Latex   Review of Systems Review of Systems  Genitourinary: Positive for hematuria.  All other systems reviewed and are negative.    Physical Exam Updated Vital Signs BP 168/72 (BP Location: Right Arm)   Pulse 100   Temp 97.8 F (36.6 C) (Oral)   Resp 22   Ht 5'  4" (1.626 m)   Wt 221 lb (100.2 kg)   SpO2 97%   BMI 37.93 kg/m   Physical Exam  Constitutional: She is oriented to person, place, and time. She appears well-developed and well-nourished. No distress.  HENT:  Head: Normocephalic.  Nose: Nose normal.  Eyes: Conjunctivae are normal.  Neck: Neck supple. No tracheal deviation present.  Cardiovascular: Normal rate, regular rhythm and normal heart sounds.   Pulmonary/Chest: Effort normal and breath sounds normal. No respiratory distress.  Abdominal: Soft. She exhibits no distension. There is no tenderness. There is no rebound and no guarding.  Neurological: She is alert and oriented to person, place, and time.  Skin: Skin is warm and dry.  Psychiatric: She has a normal mood and affect.     ED Treatments / Results  Labs (all labs ordered are listed, but only abnormal results are displayed) Labs Reviewed  URINALYSIS, ROUTINE W REFLEX MICROSCOPIC - Abnormal; Notable for the following:       Result Value   APPearance HAZY (*)    Glucose, UA >=500 (*)    Hgb urine dipstick LARGE (*)    Protein, ur 100 (*)    Leukocytes, UA LARGE (*)    Bacteria, UA RARE (*)    Squamous Epithelial / LPF 0-5 (*)    All other components within normal limits  BASIC METABOLIC PANEL - Abnormal; Notable for the following:    Sodium 133 (*)    Glucose, Bld 379 (*)    GFR calc non Af Amer 56 (*)    All other components within normal limits  CBC - Abnormal; Notable for the following:    WBC 19.0 (*)    RBC 3.66 (*)    Hemoglobin 10.0 (*)    HCT 31.2 (*)    RDW 18.8 (*)    Platelets 534 (*)    All other components within normal limits    EKG  EKG Interpretation None       Radiology No results found.  Procedures Procedures (including critical care time)  Medications Ordered in ED Medications  cephALEXin (KEFLEX) capsule 500 mg (500 mg Oral Given 11/13/16 0703)     Initial Impression / Assessment and Plan / ED Course  I have reviewed the  triage vital signs and the nursing notes.  Pertinent labs & imaging results that were available during my care of the patient were reviewed by  me and considered in my medical decision making (see chart for details).     76 y.o. female presents with typical UTI Sx of hematuria, suprapubic discomfort, and frequency for the last 2 days. UA is concerning for heavy pyuria suggesting infection. Not septic appearing. Will treat empirically with keflex pending culture. Plan to follow up with PCP as needed and return precautions discussed for worsening or new concerning symptoms.   Final Clinical Impressions(s) / ED Diagnoses   Final diagnoses:  Urinary tract infection with hematuria, site unspecified    New Prescriptions Discharge Medication List as of 11/13/2016  6:58 AM    START taking these medications   Details  cephALEXin (KEFLEX) 500 MG capsule Take 1 capsule (500 mg total) by mouth 3 (three) times daily., Starting Sat 11/13/2016, Until Tue 11/23/2016, Print         Leo Grosser, MD 11/15/16 [phone]

## 2016-11-13 NOTE — ED Triage Notes (Signed)
Pt reports she has been having diarrhea x 3 days, tonight she started to have pain in her LLQ.  When she went to urinate she noticed bright red blood on the toilet paper with clots.  Pt denies hx of fibroids.  Denies any urinary sxs.  Pt also reports burning sensation in her vagina.  Pt reports pain in her LLQ resolved at this time.

## 2016-11-14 LAB — URINE CULTURE

## 2016-11-15 ENCOUNTER — Ambulatory Visit (HOSPITAL_BASED_OUTPATIENT_CLINIC_OR_DEPARTMENT_OTHER): Payer: Medicare Other | Admitting: Hematology & Oncology

## 2016-11-15 ENCOUNTER — Other Ambulatory Visit (HOSPITAL_BASED_OUTPATIENT_CLINIC_OR_DEPARTMENT_OTHER): Payer: Medicare Other

## 2016-11-15 VITALS — BP 172/67 | HR 84 | Temp 97.6°F | Resp 20 | Wt 228.0 lb

## 2016-11-15 DIAGNOSIS — N390 Urinary tract infection, site not specified: Secondary | ICD-10-CM | POA: Diagnosis not present

## 2016-11-15 DIAGNOSIS — D45 Polycythemia vera: Secondary | ICD-10-CM

## 2016-11-15 DIAGNOSIS — N342 Other urethritis: Secondary | ICD-10-CM

## 2016-11-15 LAB — CBC WITH DIFFERENTIAL (CANCER CENTER ONLY)
BASO#: 0.2 10*3/uL (ref 0.0–0.2)
BASO%: 1.4 % (ref 0.0–2.0)
EOS ABS: 0.5 10*3/uL (ref 0.0–0.5)
EOS%: 2.6 % (ref 0.0–7.0)
HCT: 33.4 % — ABNORMAL LOW (ref 34.8–46.6)
HEMOGLOBIN: 10.6 g/dL — AB (ref 11.6–15.9)
LYMPH#: 1.5 10*3/uL (ref 0.9–3.3)
LYMPH%: 8.5 % — ABNORMAL LOW (ref 14.0–48.0)
MCH: 27.6 pg (ref 26.0–34.0)
MCHC: 31.7 g/dL — ABNORMAL LOW (ref 32.0–36.0)
MCV: 87 fL (ref 81–101)
MONO#: 1.1 10*3/uL — AB (ref 0.1–0.9)
MONO%: 6.4 % (ref 0.0–13.0)
NEUT%: 81.1 % — ABNORMAL HIGH (ref 39.6–80.0)
NEUTROS ABS: 14 10*3/uL — AB (ref 1.5–6.5)
Platelets: 468 10*3/uL — ABNORMAL HIGH (ref 145–400)
RBC: 3.84 10*6/uL (ref 3.70–5.32)
RDW: 18.3 % — ABNORMAL HIGH (ref 11.1–15.7)
WBC: 17.3 10*3/uL — AB (ref 3.9–10.0)

## 2016-11-15 LAB — FERRITIN: Ferritin: 44 ng/ml (ref 9–269)

## 2016-11-15 LAB — IRON AND TIBC
%SAT: 8 % — ABNORMAL LOW (ref 21–57)
Iron: 27 ug/dL — ABNORMAL LOW (ref 41–142)
TIBC: 331 ug/dL (ref 236–444)
UIBC: 304 ug/dL (ref 120–384)

## 2016-11-15 LAB — COMPREHENSIVE METABOLIC PANEL
ALBUMIN: 3.8 g/dL (ref 3.5–5.0)
ALK PHOS: 110 U/L (ref 40–150)
ALT: 18 U/L (ref 0–55)
AST: 33 U/L (ref 5–34)
Anion Gap: 11 mEq/L (ref 3–11)
BILIRUBIN TOTAL: 0.86 mg/dL (ref 0.20–1.20)
BUN: 9.4 mg/dL (ref 7.0–26.0)
CO2: 21 mEq/L — ABNORMAL LOW (ref 22–29)
Calcium: 9 mg/dL (ref 8.4–10.4)
Chloride: 103 mEq/L (ref 98–109)
Creatinine: 1 mg/dL (ref 0.6–1.1)
EGFR: 53 mL/min/{1.73_m2} — AB (ref >90)
GLUCOSE: 417 mg/dL — AB (ref 70–140)
Potassium: 4.4 mEq/L (ref 3.5–5.1)
SODIUM: 135 meq/L — AB (ref 136–145)
TOTAL PROTEIN: 6.6 g/dL (ref 6.4–8.3)

## 2016-11-15 LAB — TECHNOLOGIST REVIEW CHCC SATELLITE

## 2016-11-15 LAB — LACTATE DEHYDROGENASE: LDH: 284 U/L — ABNORMAL HIGH (ref 125–245)

## 2016-11-15 MED ORDER — SULFAMETHOXAZOLE-TRIMETHOPRIM 800-160 MG PO TABS: 1.0000 | ORAL_TABLET | Freq: Two times a day (BID) | ORAL | 0 refills | Status: DC

## 2016-11-15 NOTE — Progress Notes (Signed)
Hematology and Oncology Follow Up Visit  Amanda [name] [id number] [birth year] 76 y.o. 11/15/2016   Principle Diagnosis:   [name]-JAK2 (+)  Current Therapy:    Jakafi 10m po BID     Interim History:  Amanda [name] back for follow-up. As usual, she has quite a few issues. She is taking the [place] She is miserable today. She is bothered by a urinary tract infection. She went to the emergency room over the weekend. She's been 8 hours in the emergency room. A culture was done which was multiple species. They wanted a reflection. She has been on Keflex.   She still has hematuria. This is how she presented.   She's having some dysuria.   She is not having any fever. She's not having any cough. She's not having leg swelling. There's been no rashes.   I think her blood sugars have been on the high side.   Overall, her performance status is ECOG 2.  Medications:  Current Outpatient Prescriptions:  .  amLODipine (NORVASC) 5 MG tablet, Take 1 tablet (5 mg total) by mouth daily., Disp: 30 tablet, Rfl: 1 .  calcium carbonate (OS-CAL) 600 MG tablet, Take 2 tablets (1,200 mg total) by mouth daily., Disp: 60 tablet, Rfl:  .  cholecalciferol (VITAMIN D) 1000 units tablet, Take 1 tablet (1,000 Units total) by mouth daily., Disp: 30 tablet, Rfl:  .  dicyclomine (BENTYL) 10 MG capsule, Take 1 tab by mouth every morning, may take twice daily as needed., Disp: 60 capsule, Rfl: 8 .  diphenoxylate-atropine (LOMOTIL) 2.5-0.025 MG tablet, Take 1 tablet by mouth 4 (four) times daily as needed for diarrhea or loose stools., Disp: 30 tablet, Rfl: 0 .  furosemide (LASIX) 20 MG tablet, Take 1 tablet (20 mg total) by mouth daily., Disp: 20 tablet, Rfl: 0 .  insulin regular human CONCENTRATED (HUMULIN R U-500 KWIKPEN) 500 UNIT/ML kwikpen, Inject 100 units under the skin before breakfast and 85 units before dinner., Disp: 18 pen, Rfl: 1 .  loperamide (IMODIUM A-D) 2 MG tablet, Take 1-2 tablets (2-4 mg  total) by mouth daily as needed for diarrhea or loose stools., Disp: 30 tablet, Rfl: 1 .  losartan (COZAAR) 100 MG tablet, Take 1 tablet (100 mg total) by mouth daily., Disp: 60 tablet, Rfl: 0 .  nitroGLYCERIN (NITROSTAT) 0.4 MG SL tablet, Place 1 tablet (0.4 mg total) under the tongue every 5 (five) minutes as needed for chest pain., Disp: 25 tablet, Rfl: 3 .  NONFORMULARY OR COMPOUNDED ITEM, Neuropathy cream: mix Amitriptyline 2%, Lidocaine 2%, and Ketamine 1% - apply on feet 2x a day [facility]., Disp: 1 each, Rfl: 2 .  omega-3 acid ethyl esters (LOVAZA) 1 G capsule, Take 1 capsule (1 g total) by mouth 2 (two) times daily., Disp: 60 capsule, Rfl: 11 .  ondansetron (ZOFRAN ODT) 4 MG disintegrating tablet, Take 1 tablet (4 mg total) by mouth every 8 (eight) hours as needed for nausea., Disp: 6 tablet, Rfl: 0 .  ONETOUCH VERIO test strip, Use to test blood sugar 3 times daily., Disp: , Rfl:  .  promethazine (PHENERGAN) 25 MG tablet, Take 0.5 tablets (12.5 mg total) by mouth every 6 (six) hours as needed for nausea or vomiting., Disp: 30 tablet, Rfl: 1 .  rosuvastatin (CRESTOR) 20 MG tablet, TAKE 1 TABLET(20 MG) BY MOUTH DAILY, Disp: 30 tablet, Rfl: 6 .  ruxolitinib phosphate (JAKAFI) 25 MG tablet, Take 1 tablet (25 mg total) by mouth 2 (two) times  daily., Disp: 60 tablet, Rfl: 3 .  ticagrelor (BRILINTA) 60 MG TABS tablet, Take 1 tablet (60 mg total) by mouth 2 (two) times daily., Disp: 28 tablet, Rfl: 0 .  sulfamethoxazole-trimethoprim (BACTRIM DS,SEPTRA DS) 800-160 MG tablet, Take 1 tablet by mouth 2 (two) times daily., Disp: 10 tablet, Rfl: 0  Allergies:  Allergies  Allergen Reactions  . Lisinopril Cough  . Tape Hives  . Doxycycline Hives, Swelling and Rash  . Latex Hives, Itching and Rash    Past Medical History, Surgical history, Social history, and Family History were reviewed and updated.  Review of Systems: As above  Physical Exam:  weight is 228 lb (103.4 kg). Her oral  temperature is 97.6 F (36.4 C). Her blood pressure is 172/67 (abnormal) and her pulse is 84. Her respiration is 20 and oxygen saturation is 96%.   Wt Readings from Last 3 Encounters:  11/15/16 228 lb (103.4 kg)  11/13/16 221 lb (100.2 kg)  11/02/16 228 lb (103.4 kg)     Well-developed and well-nourished white female who is moderately obese. Head and neck exam shows no ocular or oral lesions. There are no palpable cervical or supraclavicular lymph nodes. She has no palpable thyroid. Lungs are clear bilaterally. She may have some wheezing and some crackles at the bases bilaterally. Cardiac exam regular rate and rhythm with no murmurs, rubs or bruits. Abdomen is soft. She has good bowel sounds. She is obese. She has no fluid wave. There is no palpable liver or spleen tip. Back exam shows no tenderness over the spine, ribs or hips. Extremity shows no clubbing, cyanosis or edema. Skin examShows extensive candida infection under the breasts bilaterally. There is a lot of erythema under the breasts bilaterally. There is a slight exudate. Otherwise, there are no rashes, ecchymoses or petechia. Neurological exam shows no focal neurological deficits. Lab Results  Component Value Date   WBC 17.3 (H) 11/15/2016   HGB 10.6 (L) 11/15/2016   HCT 33.4 (L) 11/15/2016   MCV 87 11/15/2016   PLT 468 (H) 11/15/2016     Chemistry      Component Value Date/Time   NA 133 (L) 11/13/2016 0605   NA 135 (L) 10/12/2016 1129   K 4.2 11/13/2016 0605   K 4.1 10/12/2016 1129   CL 103 11/13/2016 0605   CL 105 07/29/2016 0850   CL 103 02/16/2013 0900   CO2 22 11/13/2016 0605   CO2 23 10/12/2016 1129   BUN 13 11/13/2016 0605   BUN 12.1 10/12/2016 1129   CREATININE 0.96 11/13/2016 0605   CREATININE 1.3 (H) 10/12/2016 1129      Component Value Date/Time   CALCIUM 8.9 11/13/2016 0605   CALCIUM 9.4 10/12/2016 1129   ALKPHOS 113 10/12/2016 1129   AST 42 (H) 10/12/2016 1129   ALT 16 10/12/2016 1129   BILITOT 1.03  10/12/2016 1129         Impression and Plan: Amanda [name] is A 76 year old white female. She has a polycythemia vera. She is JAK2 positive.  It sounds like she has a urinary tract infection. I will put her on Bactrim DS.: This will make her feel better.   We'll plan to get her back in another month. I know she has a lot of other health issues. Her diabetes is probably her biggest health issue.   I wanted to try to get a urine culture on her. The culture that she had done in the ER was not adequate. However, she cannot provide  one.  I told her that if she has continued problems after the Bactrim, that she pontes to go see her family doctor.  Volanda Napoleon, MD 1/29/201812:42 PM

## 2016-11-22 ENCOUNTER — Other Ambulatory Visit: Payer: Self-pay | Admitting: *Deleted

## 2016-11-22 MED ORDER — RUXOLITINIB PHOSPHATE 25 MG PO TABS: 25.0000 mg | ORAL_TABLET | Freq: Two times a day (BID) | ORAL | 11 refills | Status: DC

## 2016-11-24 ENCOUNTER — Telehealth: Payer: Self-pay | Admitting: Emergency Medicine

## 2016-11-24 NOTE — Telephone Encounter (Signed)
Alphia Moh called on behalf of wife who was seen in ED and it was suggested she take Promethazine for nausea: no rx was given and she wanted to have this for prn use. Please call [facility] on [address].

## 2016-11-25 MED ORDER — PROMETHAZINE HCL 25 MG PO TABS: 12.5000 mg | ORAL_TABLET | Freq: Four times a day (QID) | ORAL | 1 refills | Status: DC | PRN

## 2016-11-25 NOTE — Telephone Encounter (Signed)
Rx sent. Pt notified.

## 2016-11-25 NOTE — Telephone Encounter (Signed)
Ok to refill. Should be on her med list.

## 2016-12-02 ENCOUNTER — Other Ambulatory Visit: Payer: Self-pay | Admitting: *Deleted

## 2016-12-02 MED ORDER — DIPHENOXYLATE-ATROPINE 2.5-0.025 MG PO TABS: 1.0000 | ORAL_TABLET | Freq: Two times a day (BID) | ORAL | 1 refills | Status: DC | PRN

## 2016-12-08 ENCOUNTER — Other Ambulatory Visit: Payer: Self-pay | Admitting: *Deleted

## 2016-12-08 MED ORDER — RUXOLITINIB PHOSPHATE 25 MG PO TABS: 25.0000 mg | ORAL_TABLET | Freq: Two times a day (BID) | ORAL | 11 refills | Status: DC

## 2016-12-20 ENCOUNTER — Other Ambulatory Visit: Payer: Medicare Other

## 2016-12-20 ENCOUNTER — Ambulatory Visit: Payer: Medicare Other | Admitting: Hematology & Oncology

## 2016-12-21 ENCOUNTER — Other Ambulatory Visit (HOSPITAL_BASED_OUTPATIENT_CLINIC_OR_DEPARTMENT_OTHER): Payer: Medicare Other

## 2016-12-21 ENCOUNTER — Ambulatory Visit (HOSPITAL_BASED_OUTPATIENT_CLINIC_OR_DEPARTMENT_OTHER): Payer: Medicare Other | Admitting: Family

## 2016-12-21 VITALS — BP 173/76 | HR 92 | Temp 97.3°F | Resp 20 | Wt 223.0 lb

## 2016-12-21 DIAGNOSIS — I5031 Acute diastolic (congestive) heart failure: Secondary | ICD-10-CM

## 2016-12-21 DIAGNOSIS — K909 Intestinal malabsorption, unspecified: Secondary | ICD-10-CM

## 2016-12-21 DIAGNOSIS — E1165 Type 2 diabetes mellitus with hyperglycemia: Secondary | ICD-10-CM

## 2016-12-21 DIAGNOSIS — N342 Other urethritis: Secondary | ICD-10-CM

## 2016-12-21 DIAGNOSIS — R079 Chest pain, unspecified: Secondary | ICD-10-CM | POA: Diagnosis not present

## 2016-12-21 DIAGNOSIS — D45 Polycythemia vera: Secondary | ICD-10-CM | POA: Diagnosis not present

## 2016-12-21 DIAGNOSIS — R197 Diarrhea, unspecified: Secondary | ICD-10-CM

## 2016-12-21 DIAGNOSIS — D508 Other iron deficiency anemias: Secondary | ICD-10-CM

## 2016-12-21 DIAGNOSIS — Z794 Long term (current) use of insulin: Secondary | ICD-10-CM

## 2016-12-21 DIAGNOSIS — IMO0002 Reserved for concepts with insufficient information to code with codable children: Secondary | ICD-10-CM

## 2016-12-21 DIAGNOSIS — E118 Type 2 diabetes mellitus with unspecified complications: Principal | ICD-10-CM

## 2016-12-21 LAB — CMP (CANCER CENTER ONLY)
ALK PHOS: 82 U/L (ref 26–84)
ALT: 17 U/L (ref 10–47)
AST: 36 U/L (ref 11–38)
Albumin: 3.4 g/dL (ref 3.3–5.5)
BUN, Bld: 21 mg/dL (ref 7–22)
CHLORIDE: 99 meq/L (ref 98–108)
CO2: 29 mEq/L (ref 18–33)
CREATININE: 1.9 mg/dL — AB (ref 0.6–1.2)
Calcium: 8.9 mg/dL (ref 8.0–10.3)
Glucose, Bld: 221 mg/dL — ABNORMAL HIGH (ref 73–118)
Potassium: 4.1 mEq/L (ref 3.3–4.7)
SODIUM: 143 meq/L (ref 128–145)
TOTAL PROTEIN: 6.5 g/dL (ref 6.4–8.1)
Total Bilirubin: 0.7 mg/dl (ref 0.20–1.60)

## 2016-12-21 LAB — CBC WITH DIFFERENTIAL (CANCER CENTER ONLY)
BASO#: 0.2 10*3/uL (ref 0.0–0.2)
BASO%: 0.9 % (ref 0.0–2.0)
EOS ABS: 0.4 10*3/uL (ref 0.0–0.5)
EOS%: 2.2 % (ref 0.0–7.0)
HCT: 34.9 % (ref 34.8–46.6)
HGB: 10.8 g/dL — ABNORMAL LOW (ref 11.6–15.9)
LYMPH#: 1.3 10*3/uL (ref 0.9–3.3)
LYMPH%: 6.8 % — ABNORMAL LOW (ref 14.0–48.0)
MCH: 24.7 pg — AB (ref 26.0–34.0)
MCHC: 30.9 g/dL — ABNORMAL LOW (ref 32.0–36.0)
MCV: 80 fL — ABNORMAL LOW (ref 81–101)
MONO#: 0.9 10*3/uL (ref 0.1–0.9)
MONO%: 4.7 % (ref 0.0–13.0)
NEUT%: 85.4 % — ABNORMAL HIGH (ref 39.6–80.0)
NEUTROS ABS: 16 10*3/uL — AB (ref 1.5–6.5)
Platelets: 568 10*3/uL — ABNORMAL HIGH (ref 145–400)
RBC: 4.37 10*6/uL (ref 3.70–5.32)
RDW: 19.2 % — ABNORMAL HIGH (ref 11.1–15.7)
WBC: 18.7 10*3/uL — AB (ref 3.9–10.0)

## 2016-12-21 MED ORDER — NITROGLYCERIN 0.4 MG SL SUBL: 0.4000 mg | SUBLINGUAL_TABLET | SUBLINGUAL | 3 refills | Status: DC | PRN

## 2016-12-21 MED ORDER — PANCRELIPASE (LIP-PROT-AMYL) 24000-76000 UNITS PO CPEP: 1.0000 | ORAL_CAPSULE | Freq: Three times a day (TID) | ORAL | 1 refills | Status: DC

## 2016-12-21 MED FILL — CREON DR 24,000 UNITS CAP: 24000-76000 | 30 days supply | Qty: 90 | Fill #0

## 2016-12-21 NOTE — Progress Notes (Signed)
Hematology and Oncology Follow Up Visit  CUMA POLYAKOV [id number] 03-12-1941 76 y.o. 12/21/2016   Principle Diagnosis:  [name]-JAK2 (+)  Current Therapy:   Jakafi 74m po BID    Interim History:  Ms. KSilvestriis here today with her husband for follow-up. She has been having diarrhea multiple times a day. She has a long history of IBS but states that it has gotten much worse. She is afraid to leave her house. She has had several episodes while here in our office today. She also has uncontrolled diabetes and is insulin dependent.  We discussed her trying Creon to help with her diarrhea. She is quite interested in this.  She is also complaining of a great deal of fatigue, weakness. She is chronically iron deficient and has never had this replaced. , headache and "brain fog". She has had no episodes of bleeding, bruising or petechiae.  She states that she needs a new GI doctor so I suggested she try to scheduled an appointment with Dr. JJuanita Craver She states that she will do this.  Her blood sugars are still uncontrolled. She states that she is taking her insulin and Invokana as prescribed.  She has had SOB and dizziness with over exertion since her heart attack 2 years ago. This is unchanged.  She had one episode of chest pain last week and took a nitro tablet and this resolved after a few minutes. She states that she has had the same bottle for over 2 years. I reordered so she would have new. She also plans to follow-up with cardiology if this continues and does not improve with nitro.  No fever, chills, n/v, cough, rash, palpitations or changes in bladder habits. She has mild puffiness in her feet and ankles that comes and goes. She elevates her legs which helps.  Her appetite comes and goes. She is able to stay hydrated and her weight is stable.   Medications:  Allergies as of 12/21/2016      Reactions   Lisinopril Cough   Tape Hives   Doxycycline Hives, Swelling, Rash   Latex  Hives, Itching, Rash      Medication List       Accurate as of 12/21/16  1:37 PM. Always use your most recent med list.          amLODipine 5 MG tablet Commonly known as:  NORVASC Take 1 tablet (5 mg total) by mouth daily.   calcium carbonate 600 MG tablet Commonly known as:  OS-CAL Take 2 tablets (1,200 mg total) by mouth daily.   cholecalciferol 1000 units tablet Commonly known as:  VITAMIN D Take 1 tablet (1,000 Units total) by mouth daily.   dicyclomine 10 MG capsule Commonly known as:  BENTYL Take 1 tab by mouth every morning, may take twice daily as needed.   diphenoxylate-atropine 2.5-0.025 MG tablet Commonly known as:  LOMOTIL Take 1-2 tablets by mouth 2 (two) times daily as needed for diarrhea or loose stools.   furosemide 20 MG tablet Commonly known as:  LASIX Take 1 tablet (20 mg total) by mouth daily.   insulin regular human CONCENTRATED 500 UNIT/ML kwikpen Commonly known as:  HUMULIN R U-500 KWIKPEN Inject 100 units under the skin before breakfast and 85 units before dinner.   loperamide 2 MG tablet Commonly known as:  IMODIUM A-D Take 1-2 tablets (2-4 mg total) by mouth daily as needed for diarrhea or loose stools.   losartan 100 MG tablet Commonly known as:  COZAAR Take 1 tablet (100 mg total) by mouth daily.   nitroGLYCERIN 0.4 MG SL tablet Commonly known as:  NITROSTAT Place 1 tablet (0.4 mg total) under the tongue every 5 (five) minutes as needed for chest pain.   NONFORMULARY OR COMPOUNDED ITEM Neuropathy cream: mix Amitriptyline 2%, Lidocaine 2%, and Ketamine 1% - apply on feet 2x a day [facility].   omega-3 acid ethyl esters 1 g capsule Commonly known as:  LOVAZA Take 1 capsule (1 g total) by mouth 2 (two) times daily.   ondansetron 4 MG disintegrating tablet Commonly known as:  ZOFRAN ODT Take 1 tablet (4 mg total) by mouth every 8 (eight) hours as needed for nausea.   ONETOUCH VERIO test strip Generic drug:  glucose  blood Use to test blood sugar 3 times daily.   promethazine 25 MG tablet Commonly known as:  PHENERGAN Take 0.5 tablets (12.5 mg total) by mouth every 6 (six) hours as needed for nausea or vomiting.   rosuvastatin 20 MG tablet Commonly known as:  CRESTOR TAKE 1 TABLET(20 MG) BY MOUTH DAILY   ruxolitinib phosphate 25 MG tablet Commonly known as:  JAKAFI Take 1 tablet (25 mg total) by mouth 2 (two) times daily.   sulfamethoxazole-trimethoprim 800-160 MG tablet Commonly known as:  BACTRIM DS,SEPTRA DS Take 1 tablet by mouth 2 (two) times daily.   ticagrelor 60 MG Tabs tablet Commonly known as:  BRILINTA Take 1 tablet (60 mg total) by mouth 2 (two) times daily.       Allergies:  Allergies  Allergen Reactions  . Lisinopril Cough  . Tape Hives  . Doxycycline Hives, Swelling and Rash  . Latex Hives, Itching and Rash    Past Medical History, Surgical history, Social history, and Family History were reviewed and updated.  Review of Systems: All other 10 point review of systems is negative.   Physical Exam:  vitals were not taken for this visit.  Wt Readings from Last 3 Encounters:  11/15/16 228 lb (103.4 kg)  11/13/16 221 lb (100.2 kg)  11/02/16 228 lb (103.4 kg)    Ocular: Sclerae unicteric, pupils equal, round and reactive to light Ear-nose-throat: Oropharynx clear, dentition fair Lymphatic: No cervical supraclavicular or axillary adenopathy Lungs no rales or rhonchi, good excursion bilaterally Heart regular rate and rhythm, no murmur appreciated Abd soft, nontender, positive bowel sounds, no liver or spleen tip palpated on exam MSK no focal spinal tenderness, no joint edema Neuro: non-focal, well-oriented, appropriate affect Breasts: Deferred  Lab Results  Component Value Date   WBC 17.3 (H) 11/15/2016   HGB 10.6 (L) 11/15/2016   HCT 33.4 (L) 11/15/2016   MCV 87 11/15/2016   PLT 468 (H) 11/15/2016   Lab Results  Component Value Date   FERRITIN 44  11/15/2016   IRON 27 (L) 11/15/2016   TIBC 331 11/15/2016   UIBC 304 11/15/2016   IRONPCTSAT 8 (L) 11/15/2016   Lab Results  Component Value Date   RETICCTPCT 2.4 01/30/2014   RBC 3.84 11/15/2016   No results found for: KPAFRELGTCHN, LAMBDASER, KAPLAMBRATIO Lab Results  Component Value Date   IGA 233 03/29/2014   No results found for: TOTALPROTELP, ALBUMINELP, A1GS, A2GS, BETS, BETA2SER, GAMS, MSPIKE, SPEI   Chemistry      Component Value Date/Time   NA 135 (L) 11/15/2016 1059   K 4.4 11/15/2016 1059   CL 103 11/13/2016 0605   CL 105 07/29/2016 0850   CL 103 02/16/2013 0900   CO2 21 (  L) 11/15/2016 1059   BUN 9.4 11/15/2016 1059   CREATININE 1.0 11/15/2016 1059      Component Value Date/Time   CALCIUM 9.0 11/15/2016 1059   ALKPHOS 110 11/15/2016 1059   AST 33 11/15/2016 1059   ALT 18 11/15/2016 1059   BILITOT 0.86 11/15/2016 1059      Impression and Plan: Ms. Kuhar is A 76 yo white female with polycythemia vera, JAK2 positive. She is taking her Jakafi 25 mg PO BID as prescribed and remain on her same dose.   Her main complaint at this time is her extreme fatigued and excessive diarrhea.  We will give her a dose of IV iron later this week and see if this will help improve her energy and SOB.  She will try taking Creon before meals to see if this aids with her digestions and resolved the diarrhea.  She plans to follow-up with dr. Verdia Kuba with GI for routine evaluation.  We will plan to see her back in 1 month for repeat lab work and follow-up.  She will contact our office with any questions or concerns. We can certainly see her sooner if need be.   Eliezer Bottom, NP 3/6/20181:37 PM

## 2016-12-22 DIAGNOSIS — D509 Iron deficiency anemia, unspecified: Secondary | ICD-10-CM | POA: Insufficient documentation

## 2016-12-24 ENCOUNTER — Ambulatory Visit (HOSPITAL_BASED_OUTPATIENT_CLINIC_OR_DEPARTMENT_OTHER): Payer: Medicare Other

## 2016-12-24 ENCOUNTER — Ambulatory Visit: Payer: Medicare Other

## 2016-12-24 VITALS — BP 154/55 | HR 74 | Temp 97.6°F | Resp 20

## 2016-12-24 DIAGNOSIS — D509 Iron deficiency anemia, unspecified: Secondary | ICD-10-CM

## 2016-12-24 DIAGNOSIS — D508 Other iron deficiency anemias: Secondary | ICD-10-CM

## 2016-12-24 MED ORDER — SODIUM CHLORIDE 0.9 % IV SOLN
510.0000 mg | Freq: Once | INTRAVENOUS | Status: AC
  Administered 2016-12-24: 510 mg via INTRAVENOUS
  Filled 2016-12-24: qty 17

## 2016-12-24 MED ORDER — SODIUM CHLORIDE 0.9 % IV SOLN
Freq: Once | INTRAVENOUS | Status: AC
  Administered 2016-12-24: 11:00:00 via INTRAVENOUS

## 2016-12-24 NOTE — Patient Instructions (Signed)

## 2017-01-06 ENCOUNTER — Other Ambulatory Visit: Payer: Self-pay | Admitting: Internal Medicine

## 2017-01-08 ENCOUNTER — Other Ambulatory Visit: Payer: Self-pay | Admitting: Internal Medicine

## 2017-01-10 ENCOUNTER — Other Ambulatory Visit: Payer: Self-pay

## 2017-01-10 ENCOUNTER — Telehealth: Payer: Self-pay | Admitting: Internal Medicine

## 2017-01-10 MED ORDER — INSULIN PEN NEEDLE 32G X 4 MM MISC: 3 refills | Status: DC

## 2017-01-10 NOTE — Telephone Encounter (Signed)
[name] called in and requested that her needles be sent into the [facility] on [address]. She is down to two and needs them as soon as possible.

## 2017-01-10 NOTE — Telephone Encounter (Signed)
Submitted

## 2017-01-18 ENCOUNTER — Ambulatory Visit: Payer: Medicare Other | Admitting: Internal Medicine

## 2017-01-21 ENCOUNTER — Ambulatory Visit (HOSPITAL_BASED_OUTPATIENT_CLINIC_OR_DEPARTMENT_OTHER): Payer: Medicare Other | Admitting: Hematology & Oncology

## 2017-01-21 ENCOUNTER — Other Ambulatory Visit (HOSPITAL_BASED_OUTPATIENT_CLINIC_OR_DEPARTMENT_OTHER): Payer: Medicare Other

## 2017-01-21 ENCOUNTER — Telehealth: Payer: Self-pay | Admitting: *Deleted

## 2017-01-21 VITALS — BP 159/54 | HR 88 | Temp 98.0°F | Resp 20 | Wt 223.0 lb

## 2017-01-21 DIAGNOSIS — IMO0002 Reserved for concepts with insufficient information to code with codable children: Secondary | ICD-10-CM

## 2017-01-21 DIAGNOSIS — E1140 Type 2 diabetes mellitus with diabetic neuropathy, unspecified: Secondary | ICD-10-CM

## 2017-01-21 DIAGNOSIS — R197 Diarrhea, unspecified: Secondary | ICD-10-CM

## 2017-01-21 DIAGNOSIS — Z794 Long term (current) use of insulin: Principal | ICD-10-CM

## 2017-01-21 DIAGNOSIS — E1165 Type 2 diabetes mellitus with hyperglycemia: Secondary | ICD-10-CM

## 2017-01-21 DIAGNOSIS — R635 Abnormal weight gain: Secondary | ICD-10-CM

## 2017-01-21 DIAGNOSIS — E118 Type 2 diabetes mellitus with unspecified complications: Principal | ICD-10-CM

## 2017-01-21 DIAGNOSIS — R079 Chest pain, unspecified: Secondary | ICD-10-CM

## 2017-01-21 DIAGNOSIS — D45 Polycythemia vera: Secondary | ICD-10-CM | POA: Diagnosis not present

## 2017-01-21 DIAGNOSIS — I5031 Acute diastolic (congestive) heart failure: Secondary | ICD-10-CM

## 2017-01-21 DIAGNOSIS — K909 Intestinal malabsorption, unspecified: Secondary | ICD-10-CM

## 2017-01-21 DIAGNOSIS — D508 Other iron deficiency anemias: Secondary | ICD-10-CM

## 2017-01-21 LAB — COMPREHENSIVE METABOLIC PANEL
ALBUMIN: 3.5 g/dL (ref 3.5–5.0)
ALK PHOS: 112 U/L (ref 40–150)
ALT: 16 U/L (ref 0–55)
AST: 37 U/L — ABNORMAL HIGH (ref 5–34)
Anion Gap: 10 mEq/L (ref 3–11)
BILIRUBIN TOTAL: 0.89 mg/dL (ref 0.20–1.20)
BUN: 12.2 mg/dL (ref 7.0–26.0)
CALCIUM: 9.3 mg/dL (ref 8.4–10.4)
CO2: 21 mEq/L — ABNORMAL LOW (ref 22–29)
Chloride: 106 mEq/L (ref 98–109)
Creatinine: 1 mg/dL (ref 0.6–1.1)
EGFR: 52 mL/min/{1.73_m2} — AB (ref >90)
GLUCOSE: 213 mg/dL — AB (ref 70–140)
POTASSIUM: 4.1 meq/L (ref 3.5–5.1)
Sodium: 138 mEq/L (ref 136–145)
TOTAL PROTEIN: 6.5 g/dL (ref 6.4–8.3)

## 2017-01-21 LAB — MANUAL DIFFERENTIAL (CHCC SATELLITE)
ALC: 2.1 10*3/uL (ref 0.6–2.2)
ANC (CHCC MAN DIFF): 26.3 10*3/uL — AB (ref 1.5–6.7)
Band Neutrophils: 4 % (ref 0–10)
Eos: 3 % (ref 0–7)
LYMPH: 7 % — ABNORMAL LOW (ref 14–48)
MONO: 3 % (ref 0–13)
PLT EST ~~LOC~~: INCREASED
SEG: 83 % — AB (ref 40–75)

## 2017-01-21 LAB — CBC WITH DIFFERENTIAL (CANCER CENTER ONLY)
HEMATOCRIT: 39.6 % (ref 34.8–46.6)
HEMOGLOBIN: 12.9 g/dL (ref 11.6–15.9)
MCH: 27.3 pg (ref 26.0–34.0)
MCHC: 32.6 g/dL (ref 32.0–36.0)
MCV: 84 fL (ref 81–101)
PLATELETS: 855 10*3/uL — AB (ref 145–400)
RBC: 4.72 10*6/uL (ref 3.70–5.32)
RDW: 25.3 % — ABNORMAL HIGH (ref 11.1–15.7)
WBC: 30.3 10*3/uL — ABNORMAL HIGH (ref 3.9–10.0)

## 2017-01-21 LAB — FERRITIN: FERRITIN: 109 ng/mL (ref 9–269)

## 2017-01-21 LAB — IRON AND TIBC
%SAT: 20 % — ABNORMAL LOW (ref 21–57)
Iron: 54 ug/dL (ref 41–142)
TIBC: 275 ug/dL (ref 236–444)
UIBC: 221 ug/dL (ref 120–384)

## 2017-01-21 LAB — LACTATE DEHYDROGENASE: LDH: 317 U/L — ABNORMAL HIGH (ref 125–245)

## 2017-01-21 NOTE — Telephone Encounter (Signed)
-----   Message from Eliezer Bottom, NP sent at 01/21/2017  2:07 PM EDT ----- Regarding: Iron  Iron saturation still low. Needs one dose of IV iron next week please. Thank you!  Amanda [name]  ----- Message ----- From: Interface, Lab In Three Zero One Sent: 01/21/2017   8:31 AM To: Eliezer Bottom, NP

## 2017-01-21 NOTE — Progress Notes (Signed)
Hematology and Oncology Follow Up Visit  Amanda [name] [id number] [birth year] 76 y.o. 01/21/2017   Principle Diagnosis:  [name]-JAK2 (+)  Current Therapy:   Jakafi 65m po BID    Interim History:  Amanda [name] here today with her husband for follow-up.she still is feeling tired. We did go ahead and give her a dose of iron back in March. At that time, her ferritin was 44 and her iron saturation was only 8%.  [facility] This is why her white cell count and platelet count are much higher.  She says the Creon does seem to help with her diarrhea. She does have irritable bowel.  There's been no issues with bleeding. She says her blood sugars have been under decent control. This would surprise me.  She's had no rashes.  She's had no dysuria.  She's had no fevers. There's been no cough. She's had no mouth sores.   There's been no leg swelling.  She and her husband did have a nice PGames developer   Overall, says that her performance status is ECOG 2.    Medications:  Allergies as of 01/21/2017      Reactions   Lisinopril Cough   Tape Hives   Doxycycline Hives, Swelling, Rash   Latex Hives, Itching, Rash      Medication List       Accurate as of 01/21/17  8:40 AM. Always use your most recent med list.          amLODipine 5 MG tablet Commonly known as:  NORVASC Take 1 tablet (5 mg total) by mouth daily.   calcium carbonate 600 MG tablet Commonly known as:  OS-CAL Take 2 tablets (1,200 mg total) by mouth daily.   cholecalciferol 1000 units tablet Commonly known as:  VITAMIN D Take 1 tablet (1,000 Units total) by mouth daily.   dicyclomine 10 MG capsule Commonly known as:  BENTYL Take 1 tab by mouth every morning, may take twice daily as needed.   diphenoxylate-atropine 2.5-0.025 MG tablet Commonly known as:  LOMOTIL Take 1-2 tablets by mouth 2 (two) times daily as needed for diarrhea or loose stools.   furosemide 20 MG  tablet Commonly known as:  LASIX Take 1 tablet (20 mg total) by mouth daily.   Insulin Pen Needle 32G X 4 MM Misc Use to inject insulins.   insulin regular human CONCENTRATED 500 UNIT/ML kwikpen Commonly known as:  HUMULIN R U-500 KWIKPEN Inject 100 units under the skin before breakfast and 85 units before dinner.   loperamide 2 MG tablet Commonly known as:  IMODIUM A-D Take 1-2 tablets (2-4 mg total) by mouth daily as needed for diarrhea or loose stools.   losartan 100 MG tablet Commonly known as:  COZAAR Take 1 tablet (100 mg total) by mouth daily.   nitroGLYCERIN 0.4 MG SL tablet Commonly known as:  NITROSTAT Place 1 tablet (0.4 mg total) under the tongue every 5 (five) minutes as needed for chest pain.   NONFORMULARY OR COMPOUNDED ITEM Neuropathy cream: mix Amitriptyline 2%, Lidocaine 2%, and Ketamine 1% - apply on feet 2x a day [facility].   omega-3 acid ethyl esters 1 g capsule Commonly known as:  LOVAZA Take 1 capsule (1 g total) by mouth 2 (two) times daily.   ondansetron 4 MG disintegrating tablet Commonly known as:  ZOFRAN ODT Take 1 tablet (4 mg total) by mouth every 8 (eight) hours as needed for nausea.  ONETOUCH VERIO test strip Generic drug:  glucose blood Use to test blood sugar 3 times daily.   Pancrelipase (Lip-Prot-Amyl) 24000-76000 units Cpep Take 1 capsule (24,000 Units total) by mouth 3 (three) times daily before meals.   promethazine 25 MG tablet Commonly known as:  PHENERGAN Take 0.5 tablets (12.5 mg total) by mouth every 6 (six) hours as needed for nausea or vomiting.   rosuvastatin 20 MG tablet Commonly known as:  CRESTOR TAKE 1 TABLET(20 MG) BY MOUTH DAILY   ruxolitinib phosphate 25 MG tablet Commonly known as:  JAKAFI Take 1 tablet (25 mg total) by mouth 2 (two) times daily.   sulfamethoxazole-trimethoprim 800-160 MG tablet Commonly known as:  BACTRIM DS,SEPTRA DS Take 1 tablet by mouth 2 (two) times daily.   ticagrelor 60  MG Tabs tablet Commonly known as:  BRILINTA Take 1 tablet (60 mg total) by mouth 2 (two) times daily.       Allergies:  Allergies  Allergen Reactions  . Lisinopril Cough  . Tape Hives  . Doxycycline Hives, Swelling and Rash  . Latex Hives, Itching and Rash    Past Medical History, Surgical history, Social history, and Family History were reviewed and updated.  Review of Systems: All other 10 point review of systems is negative.   Physical Exam:  weight is 223 lb (101.2 kg). Her oral temperature is 98 F (36.7 C). Her blood pressure is 159/54 (abnormal) and her pulse is 88. Her respiration is 20 and oxygen saturation is 95%.   Wt Readings from Last 3 Encounters:  01/21/17 223 lb (101.2 kg)  12/21/16 223 lb (101.2 kg)  11/15/16 228 lb (103.4 kg)    Obese white female in no obvious distress. Head and neck exam shows no ocular or oral lesions. There is no scleral icterus. She has no intraoral lesions. There is no mucositis. She has no adenopathy in the neck. Thyroid is nonpalpable. Her lungs are relatively clear bilaterally. Cardiac exam regular rate and rhythm with no murmurs, rubs or bruits. Abdomen is soft. She has decent bowel sounds. She is obese. There is no guarding or rebound tenderness. She has no fluid wave. There is no palpable liver or spleen tip. Extremities shows some trace edema in her lower legs. She has some osteophytic changes in her joints. Skin exam shows no rashes, ecchymoses or petechia. Neurological exam shows no focal neurological deficits.  Lab Results  Component Value Date   WBC 30.3 (H) 01/21/2017   HGB 12.9 01/21/2017   HCT 39.6 01/21/2017   MCV 84 01/21/2017   PLT 855 (H) 01/21/2017   Lab Results  Component Value Date   FERRITIN 44 11/15/2016   IRON 27 (L) 11/15/2016   TIBC 331 11/15/2016   UIBC 304 11/15/2016   IRONPCTSAT 8 (L) 11/15/2016   Lab Results  Component Value Date   RETICCTPCT 2.4 01/30/2014   RBC 4.72 01/21/2017   No results  found for: KPAFRELGTCHN, LAMBDASER, [facility] Lab Results  Component Value Date   IGA 233 03/29/2014   No results found for: Odetta Pink, SPEI   Chemistry      Component Value Date/Time   NA 143 12/21/2016 1312   NA 135 (L) 11/15/2016 1059   K 4.1 12/21/2016 1312   K 4.4 11/15/2016 1059   CL 99 12/21/2016 1312   CL 103 02/16/2013 0900   CO2 29 12/21/2016 1312   CO2 21 (L) 11/15/2016 1059   BUN 21 12/21/2016 1312  BUN 9.4 11/15/2016 1059   CREATININE 1.9 (H) 12/21/2016 1312   CREATININE 1.0 11/15/2016 1059      Component Value Date/Time   CALCIUM 8.9 12/21/2016 1312   CALCIUM 9.0 11/15/2016 1059   ALKPHOS 82 12/21/2016 1312   ALKPHOS 110 11/15/2016 1059   AST 36 12/21/2016 1312   AST 33 11/15/2016 1059   ALT 17 12/21/2016 1312   ALT 18 11/15/2016 1059   BILITOT 0.70 12/21/2016 1312   BILITOT 0.86 11/15/2016 1059      Impression and Plan: Ms. Lich is A 76 yo white female with polycythemia vera, JAK2 positive.   Again, she is not taking her [facility] as prescribed. She says that when she does not feel well, she does not take medications.  She really is having issues with all of her other doctors. She wants to change doctors. I'm unsure what to tell her about this. We could give her names of other physicians. She does need some specialists.  I think that if she just took the [facility] as prescribed, this will help her blood counts.  So far, the FDA has not yet approved any other agent for polycythemia.  It will be interesting to see what her blood sugar is.  As far as trying to help her stay awake, I just think there is anything that we can offer her for this. She already is taking quite a few medications. Maybe, medication and she is taking are causing her issues.  I think we will have to get her back here in another month.  We spent about 30 minutes with she and her husband. I do feel bad for her. She does have a  lot of things going on. Volanda Napoleon, MD 4/6/20188:40 AM

## 2017-01-21 NOTE — Telephone Encounter (Signed)
Spoke directly with patient.  Told her we would be calling her with an appointment for next week to receive iron infusion because her orion level was still low per Judson Roch, NP.

## 2017-01-27 ENCOUNTER — Ambulatory Visit (HOSPITAL_BASED_OUTPATIENT_CLINIC_OR_DEPARTMENT_OTHER): Payer: Medicare Other

## 2017-01-27 VITALS — BP 176/75 | HR 72 | Temp 97.6°F | Resp 19

## 2017-01-27 DIAGNOSIS — D508 Other iron deficiency anemias: Secondary | ICD-10-CM | POA: Diagnosis not present

## 2017-01-27 MED ORDER — SODIUM CHLORIDE 0.9 % IV SOLN
Freq: Once | INTRAVENOUS | Status: AC
  Administered 2017-01-27: 09:00:00 via INTRAVENOUS

## 2017-01-27 MED ORDER — SODIUM CHLORIDE 0.9 % IV SOLN
510.0000 mg | Freq: Once | INTRAVENOUS | Status: DC
  Filled 2017-01-27: qty 17

## 2017-01-27 NOTE — Patient Instructions (Signed)

## 2017-02-11 ENCOUNTER — Telehealth: Payer: Self-pay

## 2017-02-11 NOTE — Telephone Encounter (Signed)
Pre visit call completed 

## 2017-02-14 ENCOUNTER — Ambulatory Visit (HOSPITAL_BASED_OUTPATIENT_CLINIC_OR_DEPARTMENT_OTHER)
Admission: RE | Admit: 2017-02-14 | Discharge: 2017-02-14 | Disposition: A | Discharge status: Home / Self Care | Payer: Medicare Other | Source: Ambulatory Visit | Attending: Family | Admitting: Family

## 2017-02-14 ENCOUNTER — Ambulatory Visit (INDEPENDENT_AMBULATORY_CARE_PROVIDER_SITE_OTHER): Payer: Medicare Other | Admitting: Family

## 2017-02-14 ENCOUNTER — Encounter: Payer: Self-pay | Admitting: Family

## 2017-02-14 VITALS — BP 144/58 | HR 74 | Temp 97.9°F | Resp 18 | Ht 64.0 in | Wt 224.6 lb

## 2017-02-14 DIAGNOSIS — E113559 Type 2 diabetes mellitus with stable proliferative diabetic retinopathy, unspecified eye: Secondary | ICD-10-CM | POA: Diagnosis not present

## 2017-02-14 DIAGNOSIS — D45 Polycythemia vera: Secondary | ICD-10-CM

## 2017-02-14 DIAGNOSIS — E11319 Type 2 diabetes mellitus with unspecified diabetic retinopathy without macular edema: Secondary | ICD-10-CM

## 2017-02-14 DIAGNOSIS — R2681 Unsteadiness on feet: Secondary | ICD-10-CM | POA: Diagnosis not present

## 2017-02-14 DIAGNOSIS — K7460 Unspecified cirrhosis of liver: Secondary | ICD-10-CM | POA: Diagnosis not present

## 2017-02-14 DIAGNOSIS — F329 Major depressive disorder, single episode, unspecified: Secondary | ICD-10-CM

## 2017-02-14 DIAGNOSIS — M7989 Other specified soft tissue disorders: Secondary | ICD-10-CM

## 2017-02-14 DIAGNOSIS — E1142 Type 2 diabetes mellitus with diabetic polyneuropathy: Secondary | ICD-10-CM | POA: Diagnosis not present

## 2017-02-14 DIAGNOSIS — I10 Essential (primary) hypertension: Secondary | ICD-10-CM | POA: Diagnosis not present

## 2017-02-14 DIAGNOSIS — I2510 Atherosclerotic heart disease of native coronary artery without angina pectoris: Secondary | ICD-10-CM

## 2017-02-14 DIAGNOSIS — E785 Hyperlipidemia, unspecified: Secondary | ICD-10-CM | POA: Diagnosis not present

## 2017-02-14 DIAGNOSIS — R188 Other ascites: Secondary | ICD-10-CM

## 2017-02-14 DIAGNOSIS — I519 Heart disease, unspecified: Secondary | ICD-10-CM | POA: Diagnosis not present

## 2017-02-14 DIAGNOSIS — F32A Depression, unspecified: Secondary | ICD-10-CM

## 2017-02-14 DIAGNOSIS — Z794 Long term (current) use of insulin: Secondary | ICD-10-CM

## 2017-02-14 LAB — LIPID PANEL
CHOL/HDL RATIO: 6
Cholesterol: 207 mg/dL — ABNORMAL HIGH (ref 0–200)
HDL: 34 mg/dL — ABNORMAL LOW (ref >39.00)
NONHDL: 172.75
TRIGLYCERIDES: 243 mg/dL — AB (ref 0.0–149.0)
VLDL: 48.6 mg/dL — ABNORMAL HIGH (ref 0.0–40.0)

## 2017-02-14 LAB — LDL CHOLESTEROL, DIRECT: LDL DIRECT: 103 mg/dL

## 2017-02-14 NOTE — Progress Notes (Signed)
Pre visit review using our clinic review tool, if applicable. No additional management support is needed unless otherwise documented below in the visit note. 

## 2017-02-14 NOTE — Patient Instructions (Addendum)
Please complete lab work prior to leaving. Complete ultrasound of your leg on the first floor.  You will be contacted about your referral to the eye doctor.  Welcome to [facility]!

## 2017-02-14 NOTE — Progress Notes (Signed)
Subjective:    Patient ID: Amanda [name], female    DOB: [birth year], 76 y.o.   MRN: [id number]  HPI  Amanda [name] is a 76 yr old female who presents today to establish care.  She has an extensive Pmhx which includes:  DM2- (+ hx of diabetic retinopathy). She is followed by Dr. Cruzita Lederer (endocrinology).   Lab Results  Component Value Date   HGBA1C 9.6 10/28/2015   HGBA1C 8.8 (H) 01/27/2015   HGBA1C 9.8 (H) 05/16/2014   Lab Results  Component Value Date   LDLCALC 88 01/27/2015   CREATININE 1.0 01/21/2017   CAD- Reports + MI 3/17.  She had cardiac stent placed. [place] Bali is her MD. She has an  Upcoming appointment with cardiology. (5/14). She is maintained on brillinta.   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    Depression- reports that she "likes to sleep."  Lives in a 2 bedroom apartment. Plays Mahjong, goes to the temple. Doesn't do as much of this as she used to.  Sometimes looks forward to these things.    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 [name].  Maintained on Jakafi- but has some issues with compliance per heme note.   Had neg RLE [place] 1/18.    Left foot/swelling- new.    Has some pressure in her right ear.  Does report some seasonal allergies.   OSA- noted on chart. Pt denies snoring and reports that she never completed the sleep study.    Review of Systems    see HPI  Past Medical History:  Diagnosis Date  . Allergic rhinitis 01/23/2016  . C. difficile colitis   . Cancer ([place])    SKIN  . Candidiasis of skin 09/30/2014  . Cirrhosis ([place])   . Depression   . Diabetes mellitus 2008  . Gout   . Herpes simplex   . Hyperglycemia 05/31/2013  . Hyperlipidemia   . Hypertension   . Myocardial infarction ([place])   . 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 Obesity   . Polycythemia    Dr. Elease Hashimoto- [facility]  . Psoriasis      Social History   Social History  . Marital status: Married    Spouse name: N/A  . Number of children: 2  . Years of education: 2 yr colle   Occupational History  . housewife    Social History Main Topics  . Smoking status: Former Smoker    Years: 48.00    Types: Cigarettes    Quit date: 10/18/1986  . Smokeless tobacco: Never Used  . Alcohol use No  . Drug use: No  . Sexual activity: Yes    Partners: Male    Birth control/ protection: None   Other Topics Concern  . Not  on file   Social History Narrative   2 caffeine drink per day.  No regular exercise.     Retired from the bank   2 children (Daughter and a son) son has morbid obesity   2 grandchildren   3 great grandchildren    Past Surgical History:  Procedure Laterality Date  . BREAST SURGERY Left    milk duct  . COLONOSCOPY    . LEFT HEART CATHETERIZATION WITH CORONARY ANGIOGRAM N/A 01/27/2015   Procedure: LEFT HEART CATHETERIZATION WITH CORONARY ANGIOGRAM;  Surgeon: [name], MD;  [facility] LAB;  Service: Cardiovascular;  Laterality: N/A;  . TONSILLECTOMY    . TOTAL ABDOMINAL HYSTERECTOMY W/ BILATERAL SALPINGOOPHORECTOMY     for heavy periods with appendectomy    Family History  Problem Relation Age of Onset  . Diabetes Mother   . Heart disease Mother     CAD  . Hyperlipidemia Mother   . Hypertension Mother   . Kidney disease Mother   . Kidney disease Father   . Breast cancer Maternal Aunt    . Heart disease Maternal Grandmother   . Heart disease Other     maternal aunts and uncles    Allergies  Allergen Reactions  . Lisinopril Cough  . Tape Hives  . Doxycycline Hives, Swelling and Rash  . Latex Hives, Itching and Rash    Current Outpatient Prescriptions on File Prior to Visit  Medication Sig Dispense Refill  . diphenoxylate-atropine (LOMOTIL) 2.5-0.025 MG tablet Take 1-2 tablets by mouth 2 (two) times daily as needed for diarrhea or loose stools. 60 tablet 1  . Insulin Pen Needle 32G X 4 MM MISC Use to inject insulins. 100 each 3  . insulin regular human CONCENTRATED (HUMULIN R U-500 KWIKPEN) 500 UNIT/ML kwikpen Inject 100 units under the skin before breakfast and 85 units before dinner. 18 pen 1  . loperamide (IMODIUM A-D) 2 MG tablet Take 1-2 tablets (2-4 mg total) by mouth daily as needed for diarrhea or loose stools. 30 tablet 1  . losartan (COZAAR) 100 MG tablet Take 1 tablet (100 mg total) by mouth daily. 60 tablet 0  . nitroGLYCERIN (NITROSTAT) 0.4 MG SL tablet Place 1 tablet (0.4 mg total) under the tongue every 5 (five) minutes as needed for chest pain. 25 tablet 3  . ONETOUCH VERIO test strip Use to test blood sugar 3 times daily.    . Pancrelipase, Lip-Prot-Amyl, 24000-76000 units CPEP Take 1 capsule (24,000 Units total) by mouth 3 (three) times daily before meals. 90 capsule 1  . promethazine (PHENERGAN) 25 MG tablet Take 0.5 tablets (12.5 mg total) by mouth every 6 (six) hours as needed for nausea or vomiting. 30 tablet 1  . rosuvastatin (CRESTOR) 20 MG tablet TAKE 1 TABLET(20 MG) BY MOUTH DAILY 30 tablet 6  . ruxolitinib phosphate (JAKAFI) 25 MG tablet Take 1 tablet (25 mg total) by mouth 2 (two) times daily. 60 tablet 11  . ticagrelor (BRILINTA) 60 MG TABS tablet Take 1 tablet (60 mg total) by mouth 2 (two) times daily. 28 tablet 0  . amLODipine (NORVASC) 5 MG tablet Take 1 tablet (5 mg total) by mouth daily. (Patient not taking: Reported on 02/14/2017) 30  tablet 1   No current facility-administered medications on file prior to visit.     BP (!) 144/58   Pulse 74   Temp 97.9 F (36.6 C) (Oral)   Resp 18   Ht 5' 4"  (1.626 m)   Wt 224  lb 9.6 oz (101.9 kg)   SpO2 97% Comment: room air  BMI 38.55 kg/m    Objective:   Physical Exam  Constitutional: She appears well-developed and well-nourished.  Cardiovascular: Normal rate, regular rhythm and normal heart sounds.   No murmur heard. Pulmonary/Chest: Effort normal and breath sounds normal. No respiratory distress. She has no wheezes.  Musculoskeletal:  2-3+ left pedal edema   Psychiatric: She has a normal mood and affect. Her behavior is normal. Judgment and thought content normal.  Neuro- unsteady gait noted.        Assessment & Plan:  Sinus congestion- trial of claritin 12m once daily.  LLE swelling- LE doppler is obtained and is negative for DVT.   Unsteady gait- requests PT referral. Will arrange.   45 minutes spent with pt today. >50% of this time was spent counseling patient on her medical issues, diet, diabetes, depression.

## 2017-02-15 ENCOUNTER — Encounter: Payer: Self-pay | Admitting: Family

## 2017-02-15 NOTE — Assessment & Plan Note (Signed)
At baseline, management per cardiology.

## 2017-02-15 NOTE — Assessment & Plan Note (Signed)
Refer to opthalmology.

## 2017-02-15 NOTE — Assessment & Plan Note (Signed)
Above goal, restart amlodipine.

## 2017-02-15 NOTE — Assessment & Plan Note (Signed)
LDL nearly at goal on crestor, advised pt to work on diet re: hypertriglyceridemia- see mychart. Continue crestor.

## 2017-02-15 NOTE — Assessment & Plan Note (Signed)
This was diagnosed in [place].  LFT stable.   Lab Results  Component Value Date   ALT 16 01/21/2017   AST 37 (H) 01/21/2017   ALKPHOS 112 01/21/2017   BILITOT 0.89 01/21/2017

## 2017-02-15 NOTE — Assessment & Plan Note (Signed)
Following with Dr. [name] (hematology).

## 2017-02-15 NOTE — Assessment & Plan Note (Signed)
Uncontrolled- managed by endocrinology.

## 2017-02-15 NOTE — Assessment & Plan Note (Signed)
Consider addition of antidepressant next visit.

## 2017-02-15 NOTE — Assessment & Plan Note (Signed)
s/p stent placement 3/17- clinically stable. Maintained on Brillinta- advised pt to keep her upcoming appointment with Dr. [name] her cardiologist.

## 2017-02-23 NOTE — Progress Notes (Signed)
Cardiology Office Note    Date:  02/28/2017   ID:  Amanda [name], DOB [birth year], MRN [id number]  PCP:  Debbrah Alar, NP  Cardiologist: Dr. [name]   Chief Complaint  Patient presents with  . Follow-up    No chest pain, no shortness of breath, has edema in left ankle, occassional pain or cramping in legs, no lightheaded or dizziness    History of Present Illness:    Amanda [name] is a 76 y.o. female with past medical history of CAD (s/p STEMI in 01/2015 with 95% LCx stenosis and distal 80% circumflex stenosis (DESx2 placed), HTN, HLD Type 2 DM, OSA (intolerant to CPAP) and polycythemia vera who presents to the office today for routine follow-up.   She was last examined by Dr. [name] in 03/2016 and reported having significant fatigue and daytime somnolence. A sleep study was recommended and this was obtained, showing OSA.  In talking with the patient today, she reports doing well from a cardiac perspective over the past 11 months. She denies any recent chest discomfort, palpitations, lightheadedness, dizziness, presyncope, orthopnea, PND, or dyspnea on exertion.   She does report having edema along her lower extremities for the past few months. She has been seen by multiple providers for this and had a recent lower extremity doppler ultrasound on 02/14/2017 which showed no evidence of DVT along her left lower extremity.  The patient's husband reports her Brilinta was covered by [employer] but she has been on the medication for over 2 years and they will no longer cover this. She has been taking ASA 13m daily since her MI in 2016.  Past Medical History:  Diagnosis Date  . Allergic rhinitis 01/23/2016  . C. difficile colitis   . CAD (coronary artery disease)    a. s/p STEMI in 01/2015 with 95% LCx stenosis and distal 80% LCx stenosis (DESx2 placed)  . Cancer ([place]    SKIN  . Candidiasis of skin 09/30/2014  . Cirrhosis ([place]   . Depression   . Diabetes mellitus 2008  . Gout   .  Herpes simplex   . Hyperglycemia 05/31/2013  . Hyperlipidemia   . Hypertension   . Myocardial infarction ([place]   . Neuromuscular disorder ([facility]    BELL PALSY  . Obesity   . Polycythemia    Dr. WElease Hashimoto [facility] hematology  . Psoriasis     Past Surgical History:  Procedure Laterality Date  . BREAST SURGERY Left    milk duct  . COLONOSCOPY    . LEFT HEART CATHETERIZATION WITH CORONARY ANGIOGRAM N/A 01/27/2015   Procedure: LEFT HEART CATHETERIZATION WITH CORONARY ANGIOGRAM;  Surgeon: TTroy Sine MD;  [facility];  Service: Cardiovascular;  Laterality: N/A;  . TONSILLECTOMY    . TOTAL ABDOMINAL HYSTERECTOMY W/ BILATERAL SALPINGOOPHORECTOMY     for heavy periods with appendectomy    Current Medications: Outpatient Medications Prior to Visit  Medication Sig Dispense Refill  . amLODipine (NORVASC) 5 MG tablet Take 1 tablet (5 mg total) by mouth daily. 30 tablet 1  . diphenoxylate-atropine (LOMOTIL) 2.5-0.025 MG tablet Take 1-2 tablets by mouth 2 (two) times daily as needed for diarrhea or loose stools. 60 tablet 1  . Insulin Pen Needle 32G X 4 MM MISC Use to inject insulins. 100 each 3  . insulin regular human CONCENTRATED (HUMULIN R U-500 KWIKPEN) 500 UNIT/ML kwikpen Inject 100 units under the skin before breakfast and 85 units before dinner. 18 pen 1  . loperamide (IMODIUM  A-D) 2 MG tablet Take 1-2 tablets (2-4 mg total) by mouth daily as needed for diarrhea or loose stools. 30 tablet 1  . losartan (COZAAR) 100 MG tablet Take 1 tablet (100 mg total) by mouth daily. 60 tablet 0  . nitroGLYCERIN (NITROSTAT) 0.4 MG SL tablet Place 1 tablet (0.4 mg total) under the tongue every 5 (five) minutes as needed for chest pain. 25 tablet 3  . ONETOUCH VERIO test strip Use to test blood sugar 3 times daily.    . Pancrelipase, Lip-Prot-Amyl, 24000-76000 units CPEP Take 1 capsule (24,000 Units total) by mouth 3 (three) times daily before meals. 90 capsule 1  . promethazine (PHENERGAN) 25 MG  tablet Take 0.5 tablets (12.5 mg total) by mouth every 6 (six) hours as needed for nausea or vomiting. 30 tablet 1  . rosuvastatin (CRESTOR) 20 MG tablet TAKE 1 TABLET(20 MG) BY MOUTH DAILY 30 tablet 6  . ruxolitinib phosphate (JAKAFI) 25 MG tablet Take 1 tablet (25 mg total) by mouth 2 (two) times daily. 60 tablet 11  . ticagrelor (BRILINTA) 60 MG TABS tablet Take 1 tablet (60 mg total) by mouth 2 (two) times daily. 28 tablet 0   No facility-administered medications prior to visit.      Allergies:   Lisinopril; Tape; Doxycycline; and Latex   Social History   Social History  . Marital status: Married    Spouse name: N/A  . Number of children: 2  . Years of education: 2 yr colle   Occupational History  . housewife    Social History Main Topics  . Smoking status: Former Smoker    Years: 48.00    Types: Cigarettes    Quit date: 10/18/1986  . Smokeless tobacco: Never Used  . Alcohol use No  . Drug use: No  . Sexual activity: Yes    Partners: Male    Birth control/ protection: None   Other Topics Concern  . None   Social History Narrative   2 caffeine drink per day.  No regular exercise.     Retired from the bank   2 children (Daughter and a son) son has morbid obesity   2 grandchildren   3 great grandchildren     Family History:  The patient's family history includes Breast cancer in her maternal aunt; Diabetes in her mother; Heart disease in her maternal grandmother, mother, and other; Hyperlipidemia in her mother; Hypertension in her mother; Kidney disease in her father and mother.   Review of Systems:   Please see the history of present illness.     General:  No chills, fever, night sweats or weight changes.  Cardiovascular:  No chest pain, dyspnea on exertion, orthopnea, palpitations, paroxysmal nocturnal dyspnea. Positive for lower extremity edema.  Dermatological: No rash, lesions/masses Respiratory: No cough, dyspnea Urologic: No hematuria, dysuria Abdominal:    No nausea, vomiting, diarrhea, bright red blood per rectum, melena, or hematemesis Neurologic:  No visual changes, wkns, changes in mental status. All other systems reviewed and are otherwise negative except as noted above.   Physical Exam:    VS:  BP (!) 152/76   Pulse 88   Ht 5' 4.5" (1.638 m)   Wt 227 lb (103 kg)   BMI 38.36 kg/m    General: Well developed, overweight Caucasian female appearing in no acute distress. Head: Normocephalic, atraumatic, sclera non-icteric, no xanthomas, nares are without discharge.  Neck: No carotid bruits. JVD not elevated.  Lungs: Respirations regular and unlabored, without wheezes or  rales.  Heart: Regular rate and rhythm. No S3 or S4.  No murmur, no rubs, or gallops appreciated. Abdomen: Soft, non-tender, non-distended with normoactive bowel sounds. No hepatomegaly. No rebound/guarding. No obvious abdominal masses. Msk:  Strength and tone appear normal for age. No joint deformities or effusions. Extremities: No clubbing or cyanosis. 1+ pitting edema bilaterally.  Distal pedal pulses are 2+ bilaterally. Neuro: Alert and oriented X 3. Moves all extremities spontaneously. No focal deficits noted. Psych:  Responds to questions appropriately with a normal affect. Skin: No rashes or lesions noted  Wt Readings from Last 3 Encounters:  02/28/17 227 lb (103 kg)  02/25/17 225 lb 12.8 oz (102.4 kg)  02/14/17 224 lb 9.6 oz (101.9 kg)     Studies/Labs Reviewed:   EKG:  EKG is ordered today.  The ekg ordered today demonstrates NSR, HR 88, with no acute ST or T-wave changes.  Recent Labs: 02/25/2017: ALT(SGPT) 27; BUN, Bld 13; Creat 1.0; HGB 12.0; Platelets 417; Potassium 3.9; Sodium 138; TSH 3.304   Lipid Panel    Component Value Date/Time   CHOL 207 (H) 02/14/2017 1122   TRIG 243.0 (H) 02/14/2017 1122   HDL 34.00 (L) 02/14/2017 1122   CHOLHDL 6 02/14/2017 1122   VLDL 48.6 (H) 02/14/2017 1122   LDLCALC 88 01/27/2015 1320   LDLDIRECT 103.0 02/14/2017  1122    Additional studies/ records that were reviewed today include:   Echocardiogram: 01/2016 Study Conclusions  - Left ventricle: The cavity size was normal. Wall thickness was   increased in a pattern of severe LVH. Systolic function was   normal. The estimated ejection fraction was in the range of 60%   to 65%. Doppler parameters are consistent with abnormal left   ventricular relaxation (grade 1 diastolic dysfunction).  Impressions:  - The echo is extremelty difficult.   limited views     the LV function seen in the apical 4 chamber view shows normal LV   function .  Cardiac Catheterization: 01/2015 ANGIOGRAPHY:  Left main: Short normal vessel which immediately bifurcated into an LAD and left circumflex vessel.  LAD: Moderate size vessel that gave rise to a proximal diagonal vessel and several septal perforating arteries. The vessel was free of significant disease.  Left circumflex: moderate size vessel that gave rise to small first marginal branch at a percent mid stenosis in the small branch. The circumflex beyond this had diffuse 90-95% ulcerated plaque in its mid segment. Distally there was diffuse 80% stenosis in the region of the small branch takeoff.   Right coronary artery: Large caliber vessel which had mild luminal irregularity of 20% in its mid segment. The vessel supplies a large PDA vessel was otherwise free of significant disease.  Left ventriculography revealed mild acute LV dysfunction with an ejection fraction of 45-50%. There was mild focal mid inferior and focal anterolateral hypocontractility concordant with left circumflex disease.  Following PCI to the left circumflex vessel with PTCA/DES stenting and insertion of a 3.023 mm Xience alpine DES stent in the mid circumflex postdilated to 3.15 mm the 95% stenosis was reduced to 0%. The distal 80% stenosis was reduced to 0% with insertion of a 2.518 mm Xience alpine DES stent dilated 2.57  mm.   IMPRESSION:  Acute high risk ST segment elevation inferolateral myocardial infarction secondary to circumflex occlusion with development of idioventricular rhythm with probable spontaneous reperfusion en route by [facility].  Predominent single vessel coronary artery disease with 90-95% ulcerated plaque in the mid AV  groove circumflex with distal 80% circumflex stenosis, and 80% stenosis in a very small OM1 vessel; normal LAD; mild 20% irregularity in the mid RCA.  Temporary pacemaker for slow idioventricular rhythm.  Successful acute percutaneous coronary intervention left circumflex vessel with the 90-95% stenosis being reduced to 0% after insertion of a Xience Alpine 3.023 mm DES stent postdilated 3.15 mm, and the distal circumflex 80% stenosis been reduced to 0% treated with 2.518 mm Xience Alpine DES stent dilated to 2.57 mm.  RECOMMENDATION:  Patient will continue with antiplatelet therapy for minimum of 1 year but probably longer with her diabetes mellitus. Aggressive lipid-lowering therapy, blood pressure control, be necessary. She will be hydrated postprocedure with underlying renal insufficiency.   Lower Extremity Doppler: 01/2017 Calf Veins: No evidence of thrombus. Normal compressibility and flow on color Doppler imaging.  Superficial Great Saphenous Vein: No evidence of thrombus. Normal compressibility and flow on color Doppler imaging.  Venous Reflux:  None.  Other Findings:  None.  IMPRESSION: No deep venous thrombosis in the visualized left lower extremity.   Assessment:    1. Coronary artery disease involving native coronary artery of native heart without angina pectoris   2. Bilateral lower extremity edema   3. Essential hypertension, benign   4. Hyperlipidemia LDL goal <70   5. [name] ([place])   6. OSA (obstructive sleep apnea)      Plan:   In order of problems listed above:  1. Coronary Artery Disease - s/p STEMI in  01/2015 with 95% LCx stenosis and distal 80% circumflex stenosis (DESx2 placed). - she denies any recent chest pain or dyspnea on exertion. Her EKG is without acute ischemic changes.  - continue ASA 58m daily. Will stop Brilinta as she is over 2 years out from stent placement and her patient assistance program for the medication has expired and she is no longer able to afford it. Will route today's note to Dr. KClaiborne Billingsto further assess if he is in agreement to have her on ASA 874mdaily vs. being on ASA and Plavix. Would prefer ASA 8169maily only with her varying blood counts in the setting of known polycythemia vera. Was on BB therapy in the past but she is unsure why this was discontinued (questionable bradycardia?). Continue statin therapy.   2. Lower Extremity Edema - has experienced worsening edema for the past few months. No associated orthopnea, PND, or dyspnea on exertion. Echo in 01/2016 showed a preserved EF of 60-65% with Grade 1 DD.  - dopplers on 02/14/2017 showed no evidence of DVT along her left lower extremity. - will start Lasix 29m23mily to take for the next 3 days then PRN for lower extremity edema. She is scheduled for repeat blood work next week (will need to follow creatinine and K+ levels).   3. HTN - BP at 158/86 on initial check, at 152/76 on recheck. She has not yet taken her morning medications.  - continue Amlodipine 5mg 49mly and Losartan 100mg 65my.   4. HLD - followed by PCP. LDL goal is < 70 with known CAD.  - continue Crestor 29mg d62m.   5. [name]  - followed by Hematology. Hgb at 12.0, platelets at 417K when checked on 02/25/2017.  6. OSA - sleep study in 2017 confirmed this yet she was intolerant to the CPAP machine.   Medication Adjustments/Labs and Tests Ordered: Current medicines are reviewed at length with the patient today.  Concerns regarding medicines are outlined above.  Medication changes,  Labs and Tests ordered today are listed in the  Patient Instructions below. Patient Instructions  Medication Instructions:  STOP- Brilinta START- Lasix 20 mg daily as needed for Edema Continue- Take Aspirin 81 mg daily  Labwork: None Ordered  Testing/Procedures: None Ordered  Follow-Up: Your physician wants you to follow-up in: 6 Months with Dr [name]. You will receive a reminder letter in the mail two months in advance. If you don't receive a letter, please call our office to schedule the follow-up appointment.  Any Other Special Instructions Will Be Listed Below (If Applicable).  If you need a refill on your cardiac medications before your next appointment, please call your pharmacy.   Signed, Erma Heritage, PA-C  02/28/2017 1:04 PM    [facility] [place], [place], [place]  [address] Phone: [phone]; Fax: [phone]  [address] [place], [place] [address] Phone: [phone]

## 2017-02-25 ENCOUNTER — Ambulatory Visit (HOSPITAL_BASED_OUTPATIENT_CLINIC_OR_DEPARTMENT_OTHER): Payer: Medicare Other | Admitting: Hematology & Oncology

## 2017-02-25 ENCOUNTER — Other Ambulatory Visit (HOSPITAL_BASED_OUTPATIENT_CLINIC_OR_DEPARTMENT_OTHER): Payer: Medicare Other

## 2017-02-25 VITALS — BP 175/60 | HR 93 | Temp 98.1°F | Resp 20 | Wt 225.8 lb

## 2017-02-25 DIAGNOSIS — E1140 Type 2 diabetes mellitus with diabetic neuropathy, unspecified: Secondary | ICD-10-CM

## 2017-02-25 DIAGNOSIS — R635 Abnormal weight gain: Secondary | ICD-10-CM

## 2017-02-25 DIAGNOSIS — D509 Iron deficiency anemia, unspecified: Secondary | ICD-10-CM | POA: Diagnosis not present

## 2017-02-25 DIAGNOSIS — IMO0002 Reserved for concepts with insufficient information to code with codable children: Secondary | ICD-10-CM

## 2017-02-25 DIAGNOSIS — D45 Polycythemia vera: Secondary | ICD-10-CM

## 2017-02-25 DIAGNOSIS — E1165 Type 2 diabetes mellitus with hyperglycemia: Secondary | ICD-10-CM

## 2017-02-25 DIAGNOSIS — Z794 Long term (current) use of insulin: Secondary | ICD-10-CM

## 2017-02-25 LAB — MANUAL DIFFERENTIAL (CHCC SATELLITE)
ALC: 2.4 10*3/uL — ABNORMAL HIGH (ref 0.6–2.2)
ANC (CHCC MAN DIFF): 15.8 10*3/uL — AB (ref 1.5–6.7)
BAND NEUTROPHILS: 5 % (ref 0–10)
EOS: 2 % (ref 0–7)
LYMPH: 13 % — ABNORMAL LOW (ref 14–48)
MONO: 1 % (ref 0–13)
MYELOCYTES: 1 % — AB (ref 0–0)
PLT EST ~~LOC~~: INCREASED
SEG: 78 % — ABNORMAL HIGH (ref 40–75)

## 2017-02-25 LAB — CMP (CANCER CENTER ONLY)
ALT(SGPT): 27 U/L (ref 10–47)
AST: 48 U/L — AB (ref 11–38)
Albumin: 3.4 g/dL (ref 3.3–5.5)
Alkaline Phosphatase: 113 U/L — ABNORMAL HIGH (ref 26–84)
BILIRUBIN TOTAL: 0.9 mg/dL (ref 0.20–1.60)
BUN, Bld: 13 mg/dL (ref 7–22)
CO2: 25 meq/L (ref 18–33)
Calcium: 9 mg/dL (ref 8.0–10.3)
Chloride: 107 mEq/L (ref 98–108)
Creat: 1 mg/dl (ref 0.6–1.2)
Glucose, Bld: 304 mg/dL — ABNORMAL HIGH (ref 73–118)
Potassium: 3.9 mEq/L (ref 3.3–4.7)
Sodium: 138 mEq/L (ref 128–145)
Total Protein: 6.5 g/dL (ref 6.4–8.1)

## 2017-02-25 LAB — CBC WITH DIFFERENTIAL (CANCER CENTER ONLY)
HEMATOCRIT: 36.3 % (ref 34.8–46.6)
HEMOGLOBIN: 12 g/dL (ref 11.6–15.9)
MCH: 28.6 pg (ref 26.0–34.0)
MCHC: 33.1 g/dL (ref 32.0–36.0)
MCV: 86 fL (ref 81–101)
Platelets: 417 10*3/uL — ABNORMAL HIGH (ref 145–400)
RBC: 4.2 10*6/uL (ref 3.70–5.32)
RDW: 21.8 % — ABNORMAL HIGH (ref 11.1–15.7)
WBC: 18.8 10*3/uL — ABNORMAL HIGH (ref 3.9–10.0)

## 2017-02-25 LAB — FERRITIN: FERRITIN: 460 ng/mL — AB (ref 9–269)

## 2017-02-25 LAB — IRON AND TIBC
%SAT: 44 % (ref 21–57)
Iron: 98 ug/dL (ref 41–142)
TIBC: 224 ug/dL — ABNORMAL LOW (ref 236–444)
UIBC: 125 ug/dL (ref 120–384)

## 2017-02-25 LAB — TSH: TSH: 3.304 m(IU)/L (ref 0.308–3.960)

## 2017-02-25 NOTE — Progress Notes (Signed)
Hematology and Oncology Follow Up Visit  Amanda [name] [id number] Dec 27, 1940 76 y.o. 02/25/2017   Principle Diagnosis:  [name]-JAK2 (+)  Current Therapy:   Jakafi 81m po BID    Interim History:  Amanda [name] here today with her husband for follow-up.her hair is nice and currently now. She had a new hairstyle.  [place] [facility]. Because of this, the 8 out quite a bit. Her blood sugars are on the high side.  She says she is taking the JAKAFI on schedule. She is doing well with this.  She has had no bleeding. She's had no fever. She's had no rashes. She's had no change in bowel or bladder habits.  She last got iron back in March. She has done well with this.  Overall, says that her performance status is ECOG 2.    Medications:  Allergies as of 02/25/2017      Reactions   Lisinopril Cough   Tape Hives   Doxycycline Hives, Swelling, Rash   Latex Hives, Itching, Rash      Medication List       Accurate as of 02/25/17  8:56 AM. Always use your most recent med list.          amLODipine 5 MG tablet Commonly known as:  NORVASC Take 1 tablet (5 mg total) by mouth daily.   diphenoxylate-atropine 2.5-0.025 MG tablet Commonly known as:  LOMOTIL Take 1-2 tablets by mouth 2 (two) times daily as needed for diarrhea or loose stools.   Insulin Pen Needle 32G X 4 MM Misc Use to inject insulins.   insulin regular human CONCENTRATED 500 UNIT/ML kwikpen Commonly known as:  HUMULIN R U-500 KWIKPEN Inject 100 units under the skin before breakfast and 85 units before dinner.   loperamide 2 MG tablet Commonly known as:  IMODIUM A-D Take 1-2 tablets (2-4 mg total) by mouth daily as needed for diarrhea or loose stools.   losartan 100 MG tablet Commonly known as:  COZAAR Take 1 tablet (100 mg total) by mouth daily.   nitroGLYCERIN 0.4 MG SL tablet Commonly known as:  NITROSTAT Place 1  tablet (0.4 mg total) under the tongue every 5 (five) minutes as needed for chest pain.   ONETOUCH VERIO test strip Generic drug:  glucose blood Use to test blood sugar 3 times daily.   Pancrelipase (Lip-Prot-Amyl) 24000-76000 units Cpep Take 1 capsule (24,000 Units total) by mouth 3 (three) times daily before meals.   promethazine 25 MG tablet Commonly known as:  PHENERGAN Take 0.5 tablets (12.5 mg total) by mouth every 6 (six) hours as needed for nausea or vomiting.   rosuvastatin 20 MG tablet Commonly known as:  CRESTOR TAKE 1 TABLET(20 MG) BY MOUTH DAILY   ruxolitinib phosphate 25 MG tablet Commonly known as:  JAKAFI Take 1 tablet (25 mg total) by mouth 2 (two) times daily.   ticagrelor 60 MG Tabs tablet Commonly known as:  BRILINTA Take 1 tablet (60 mg total) by mouth 2 (two) times daily.       Allergies:  Allergies  Allergen Reactions  . Lisinopril Cough  . Tape Hives  . Doxycycline Hives, Swelling and Rash  . Latex Hives, Itching and Rash    Past Medical History, Surgical history, Social history, and Family History were reviewed and updated.  Review of Systems: All other 10 point review of systems is negative.   Physical Exam:  weight is 225 lb 12.8 oz (102.4 kg). Her oral temperature is 98.1 F (36.7 C). Her blood pressure is 175/60 (abnormal) and her pulse is 93. Her respiration is 20 and oxygen saturation is 97%.   Wt Readings from Last 3 Encounters:  02/25/17 225 lb 12.8 oz (102.4 kg)  02/14/17 224 lb 9.6 oz (101.9 kg)  01/21/17 223 lb (101.2 kg)    Obese white female in no obvious distress. Head and neck exam shows no ocular or oral lesions. There is no scleral icterus. She has no intraoral lesions. There is no mucositis. She has no adenopathy in the neck. Thyroid is nonpalpable. Her lungs are relatively clear bilaterally. Cardiac exam regular rate and rhythm with no murmurs, rubs or bruits. Abdomen is soft. She has decent bowel sounds. She is obese.  There is no guarding or rebound tenderness. She has no fluid wave. There is no palpable liver or spleen tip. Extremities shows some trace edema in her lower legs. She has some osteophytic changes in her joints. Skin exam shows no rashes, ecchymoses or petechia. Neurological exam shows no focal neurological deficits.  Lab Results  Component Value Date   WBC 18.8 (H) 02/25/2017   HGB 12.0 02/25/2017   HCT 36.3 02/25/2017   MCV 86 02/25/2017   PLT 417 (H) 02/25/2017   Lab Results  Component Value Date   FERRITIN 109 01/21/2017   IRON 54 01/21/2017   TIBC 275 01/21/2017   UIBC 221 01/21/2017   IRONPCTSAT 20 (L) 01/21/2017   Lab Results  Component Value Date   RETICCTPCT 2.4 01/30/2014   RBC 4.20 02/25/2017   No results found for: Nils Pyle [facility] Lab Results  Component Value Date   IGA 233 03/29/2014   No results found for: Kathrynn Ducking, MSPIKE, SPEI   Chemistry      Component Value Date/Time   NA 138 02/25/2017 0808   NA 138 01/21/2017 0806   K 3.9 02/25/2017 0808   K 4.1 01/21/2017 0806   CL 107 02/25/2017 0808   CL 103 02/16/2013 0900   CO2 25 02/25/2017 0808   CO2 21 (L) 01/21/2017 0806   BUN 13 02/25/2017 0808   BUN 12.2 01/21/2017 0806   CREATININE 1.0 02/25/2017 0808   CREATININE 1.0 01/21/2017 0806      Component Value Date/Time   CALCIUM 9.0 02/25/2017 0808   CALCIUM 9.3 01/21/2017 0806   ALKPHOS 113 (H) 02/25/2017 0808   ALKPHOS 112 01/21/2017 0806   AST 48 (H) 02/25/2017 0808   AST 37 (H) 01/21/2017 0806   ALT 27 02/25/2017 0808   ALT 16 01/21/2017 0806   BILITOT 0.90 02/25/2017 0808   BILITOT 0.89 01/21/2017 0806      Impression and Plan: Amanda [name] is A 76 yo white female with polycythemia vera, JAK2 positive.   I'm so glad to see that her blood counts are doing much better. I just wish that her blood sugar would get better. However, some of this was because she and her husband had  a go out of town down to [place] for a couple days and they ate out a lot.  I we can probably get her back now in 2 months. She knows the importance of taking her JAKAFI. She can see the improved results.  I think that the iron that she got back in March also has helped.    Volanda Napoleon, MD 5/11/20188:56 AM

## 2017-02-28 ENCOUNTER — Encounter: Payer: Self-pay | Admitting: Student

## 2017-02-28 ENCOUNTER — Ambulatory Visit (INDEPENDENT_AMBULATORY_CARE_PROVIDER_SITE_OTHER): Payer: Medicare Other | Admitting: Student

## 2017-02-28 VITALS — BP 152/76 | HR 88 | Ht 64.5 in | Wt 227.0 lb

## 2017-02-28 DIAGNOSIS — G4733 Obstructive sleep apnea (adult) (pediatric): Secondary | ICD-10-CM | POA: Diagnosis not present

## 2017-02-28 DIAGNOSIS — E785 Hyperlipidemia, unspecified: Secondary | ICD-10-CM | POA: Diagnosis not present

## 2017-02-28 DIAGNOSIS — I2510 Atherosclerotic heart disease of native coronary artery without angina pectoris: Secondary | ICD-10-CM

## 2017-02-28 DIAGNOSIS — R600 Localized edema: Secondary | ICD-10-CM

## 2017-02-28 DIAGNOSIS — I10 Essential (primary) hypertension: Secondary | ICD-10-CM

## 2017-02-28 DIAGNOSIS — D45 Polycythemia vera: Secondary | ICD-10-CM | POA: Diagnosis not present

## 2017-02-28 MED ORDER — FUROSEMIDE 20 MG PO TABS: 20.0000 mg | ORAL_TABLET | Freq: Every day | ORAL | 6 refills | Status: DC

## 2017-02-28 NOTE — Patient Instructions (Signed)
Medication Instructions:  STOP- Brilinta START- Lasix 20 mg daily as needed for Edema Continue- Take Aspirin 81 mg daily  Labwork: None Ordered  Testing/Procedures: None Ordered  Follow-Up: Your physician wants you to follow-up in: 6 Months with Dr [name]. You will receive a reminder letter in the mail two months in advance. If you don't receive a letter, please call our office to schedule the follow-up appointment.   Any Other Special Instructions Will Be Listed Below (If Applicable).   If you need a refill on your cardiac medications before your next appointment, please call your pharmacy.

## 2017-03-01 ENCOUNTER — Encounter: Payer: Self-pay | Admitting: Family

## 2017-03-11 NOTE — Progress Notes (Deleted)
Subjective:   Amanda [name] is a 76 y.o. female who presents for an Initial Medicare Annual Wellness Visit.  Review of Systems    No ROS.  Medicare Wellness Visit.   Sleep patterns:  Home Safety/Smoke Alarms:   Living environment; residence and Firearm Safety:  Seat Belt Safety/Bike Helmet: Wears seat belt.   Counseling:   Eye Exam-  Dental-  Female:   Pap- Hysterectomy      Mammo- last 06/05/15: BI-RADS CATEGORY  1: Negative.      Dexa scan- last 03/29/16:  osteopenia CCS- last 05/06/11: polyps removed. Recall not on file.    Objective:    There were no vitals filed for this visit. There is no height or weight on file to calculate BMI.   Current Medications (verified) Outpatient Encounter Prescriptions as of 03/15/2017  Medication Sig  . amLODipine (NORVASC) 5 MG tablet Take 1 tablet (5 mg total) by mouth daily.  Marland Kitchen aspirin EC 81 MG tablet Take 81 mg by mouth daily.  . diphenoxylate-atropine (LOMOTIL) 2.5-0.025 MG tablet Take 1-2 tablets by mouth 2 (two) times daily as needed for diarrhea or loose stools.  . furosemide (LASIX) 20 MG tablet Take 1 tablet (20 mg total) by mouth daily. As needed for Edema  . Insulin Pen Needle 32G X 4 MM MISC Use to inject insulins.  . insulin regular human CONCENTRATED (HUMULIN R U-500 KWIKPEN) 500 UNIT/ML kwikpen Inject 100 units under the skin before breakfast and 85 units before dinner.  . loperamide (IMODIUM A-D) 2 MG tablet Take 1-2 tablets (2-4 mg total) by mouth daily as needed for diarrhea or loose stools.  Marland Kitchen losartan (COZAAR) 100 MG tablet Take 1 tablet (100 mg total) by mouth daily.  . nitroGLYCERIN (NITROSTAT) 0.4 MG SL tablet Place 1 tablet (0.4 mg total) under the tongue every 5 (five) minutes as needed for chest pain.  Glory Rosebush VERIO test strip Use to test blood sugar 3 times daily.  . Pancrelipase, Lip-Prot-Amyl, 24000-76000 units CPEP Take 1 capsule (24,000 Units total) by mouth 3 (three) times daily before meals.  .  promethazine (PHENERGAN) 25 MG tablet Take 0.5 tablets (12.5 mg total) by mouth every 6 (six) hours as needed for nausea or vomiting.  . rosuvastatin (CRESTOR) 20 MG tablet TAKE 1 TABLET(20 MG) BY MOUTH DAILY  . ruxolitinib phosphate (JAKAFI) 25 MG tablet Take 1 tablet (25 mg total) by mouth 2 (two) times daily.   No facility-administered encounter medications on file as of 03/15/2017.     Allergies (verified) Lisinopril; Tape; Doxycycline; and Latex   History: Past Medical History:  Diagnosis Date  . Allergic rhinitis 01/23/2016  . C. difficile colitis   . CAD (coronary artery disease)    a. s/p STEMI in 01/2015 with 95% LCx stenosis and distal 80% LCx stenosis (DESx2 placed)  . Cancer ([place])    SKIN  . Candidiasis of skin 09/30/2014  . Cirrhosis ([place])   . Depression   . Diabetes mellitus 2008  . Gout   . Herpes simplex   . Hyperglycemia 05/31/2013  . Hyperlipidemia   . Hypertension   . Myocardial infarction ([place])   . 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 Obesity   . Polycythemia    Dr. Elease Hashimoto- [facility]  . Psoriasis    Past Surgical History:  Procedure Laterality Date  . BREAST SURGERY Left    milk duct  . COLONOSCOPY    . LEFT HEART CATHETERIZATION WITH CORONARY  ANGIOGRAM N/A 01/27/2015   Procedure: LEFT HEART CATHETERIZATION WITH CORONARY ANGIOGRAM;  Surgeon: [name], MD;  Location[facility];  Service: Cardiovascular;  Laterality: N/A;  . TONSILLECTOMY    . TOTAL ABDOMINAL HYSTERECTOMY W/ BILATERAL SALPINGOOPHORECTOMY     for heavy periods with appendectomy   Family History  Problem Relation Age of Onset  . Diabetes Mother   . Heart disease Mother        CAD  . Hyperlipidemia Mother   . Hypertension Mother   . Kidney disease Mother   . Kidney disease Father   . Breast cancer Maternal Aunt   . Heart disease Maternal Grandmother   . Heart disease Other        maternal aunts and uncles   Social History   Occupational History  .  housewife    Social History Main Topics  . Smoking status: Former Smoker    Years: 48.00    Types: Cigarettes    Quit date: 10/18/1986  . Smokeless tobacco: Never Used  . Alcohol use No  . Drug use: No  . Sexual activity: Yes    Partners: Male    Birth control/ protection: None    Tobacco Counseling Counseling given: Not Answered   Activities of Daily Living In your present state of health, do you have any difficulty performing the following activities: 03/22/2016  Hearing? N  Vision? N  Difficulty concentrating or making decisions? N  Walking or climbing stairs? Y  Dressing or bathing? N  Some recent data might be hidden    Immunizations and Health Maintenance Immunization History  Administered Date(s) Administered  . Hep A / Hep B 04/12/2014, 04/22/2014, 05/09/2014  . Influenza Whole 07/10/2010  . Influenza, Seasonal, Injecte, Preservative Fre 07/09/2013, 07/04/2014  . Influenza,inj,Quad PF,36+ Mos 07/09/2013, 07/04/2014, 07/01/2015, 07/29/2016  . Influenza-Unspecified 07/04/2015  . Pneumococcal Conjugate-13 05/12/2015, 10/28/2015  . Pneumococcal Polysaccharide-23 07/10/2010  . Tdap 01/17/2015   Health Maintenance Due  Topic Date Due  . HEMOGLOBIN A1C  04/26/2016  . FOOT EXAM  08/12/2016    Patient Care Team: Debbrah Alar, NP as PCP - General (Internal Medicine) [name], MD as Consulting Physician (Endocrinology) Milus Banister, MD as Attending Physician (Gastroenterology)  Indicate any recent Medical Services you may have received from other than [facility] providers in the past year (date may be approximate).     Assessment:   This is a routine wellness examination for Amanda [name]. Physical assessment deferred to PCP.   Hearing/Vision screen No exam data present  Dietary issues and exercise activities discussed:   Diet (meal preparation, eat out, water intake, caffeinated beverages, dairy products, fruits and vegetables): {Desc;  diets:16563} Breakfast: Lunch:  Dinner:     Goals    None     Depression Screen PHQ 2/9 Scores 02/14/2017 01/26/2016 09/04/2014  PHQ - 2 Score 0 0 2  PHQ- 9 Score 17 - 5    Fall Risk Fall Risk  11/15/2016 10/12/2016 09/10/2016 07/29/2016 06/07/2016  Falls in the past year? No No No No No    Cognitive Function:        Screening Tests Health Maintenance  Topic Date Due  . HEMOGLOBIN A1C  04/26/2016  . FOOT EXAM  08/12/2016  . OPHTHALMOLOGY EXAM  02/14/2018 (Originally 07/26/2014)  . INFLUENZA VACCINE  05/18/2017  . TETANUS/TDAP  01/16/2025  . DEXA SCAN  Completed  . PNA vac Low Risk Adult  Completed      Plan:   ***  I have personally reviewed and noted the following in the patient's chart:   . Medical and social history . Use of alcohol, tobacco or illicit drugs  . Current medications and supplements . Functional ability and status . Nutritional status . Physical activity . Advanced directives . List of other physicians . Hospitalizations, surgeries, and ER visits in previous 12 months . Vitals . Screenings to include cognitive, depression, and falls . Referrals and appointments  In addition, I have reviewed and discussed with patient certain preventive protocols, quality metrics, and best practice recommendations. A written personalized care plan for preventive services as well as general preventive health recommendations were provided to patient.     Shela Nevin, [place]   03/11/2017

## 2017-03-15 ENCOUNTER — Encounter: Payer: Medicare Other | Admitting: Family

## 2017-03-20 IMAGING — DX DG CHEST 2V
2 series · 2 of 2 positions shown · non-contrast
Comparison: 01/26/2016; 01/31/2015; 01/29/2014; 05/31/2013

CLINICAL DATA: Shortness of breath and asthma the past 3 days.
Cough. Former smoker. History of hypertension and diabetes.

EXAM:
CHEST  2 VIEW

[w chest pa]
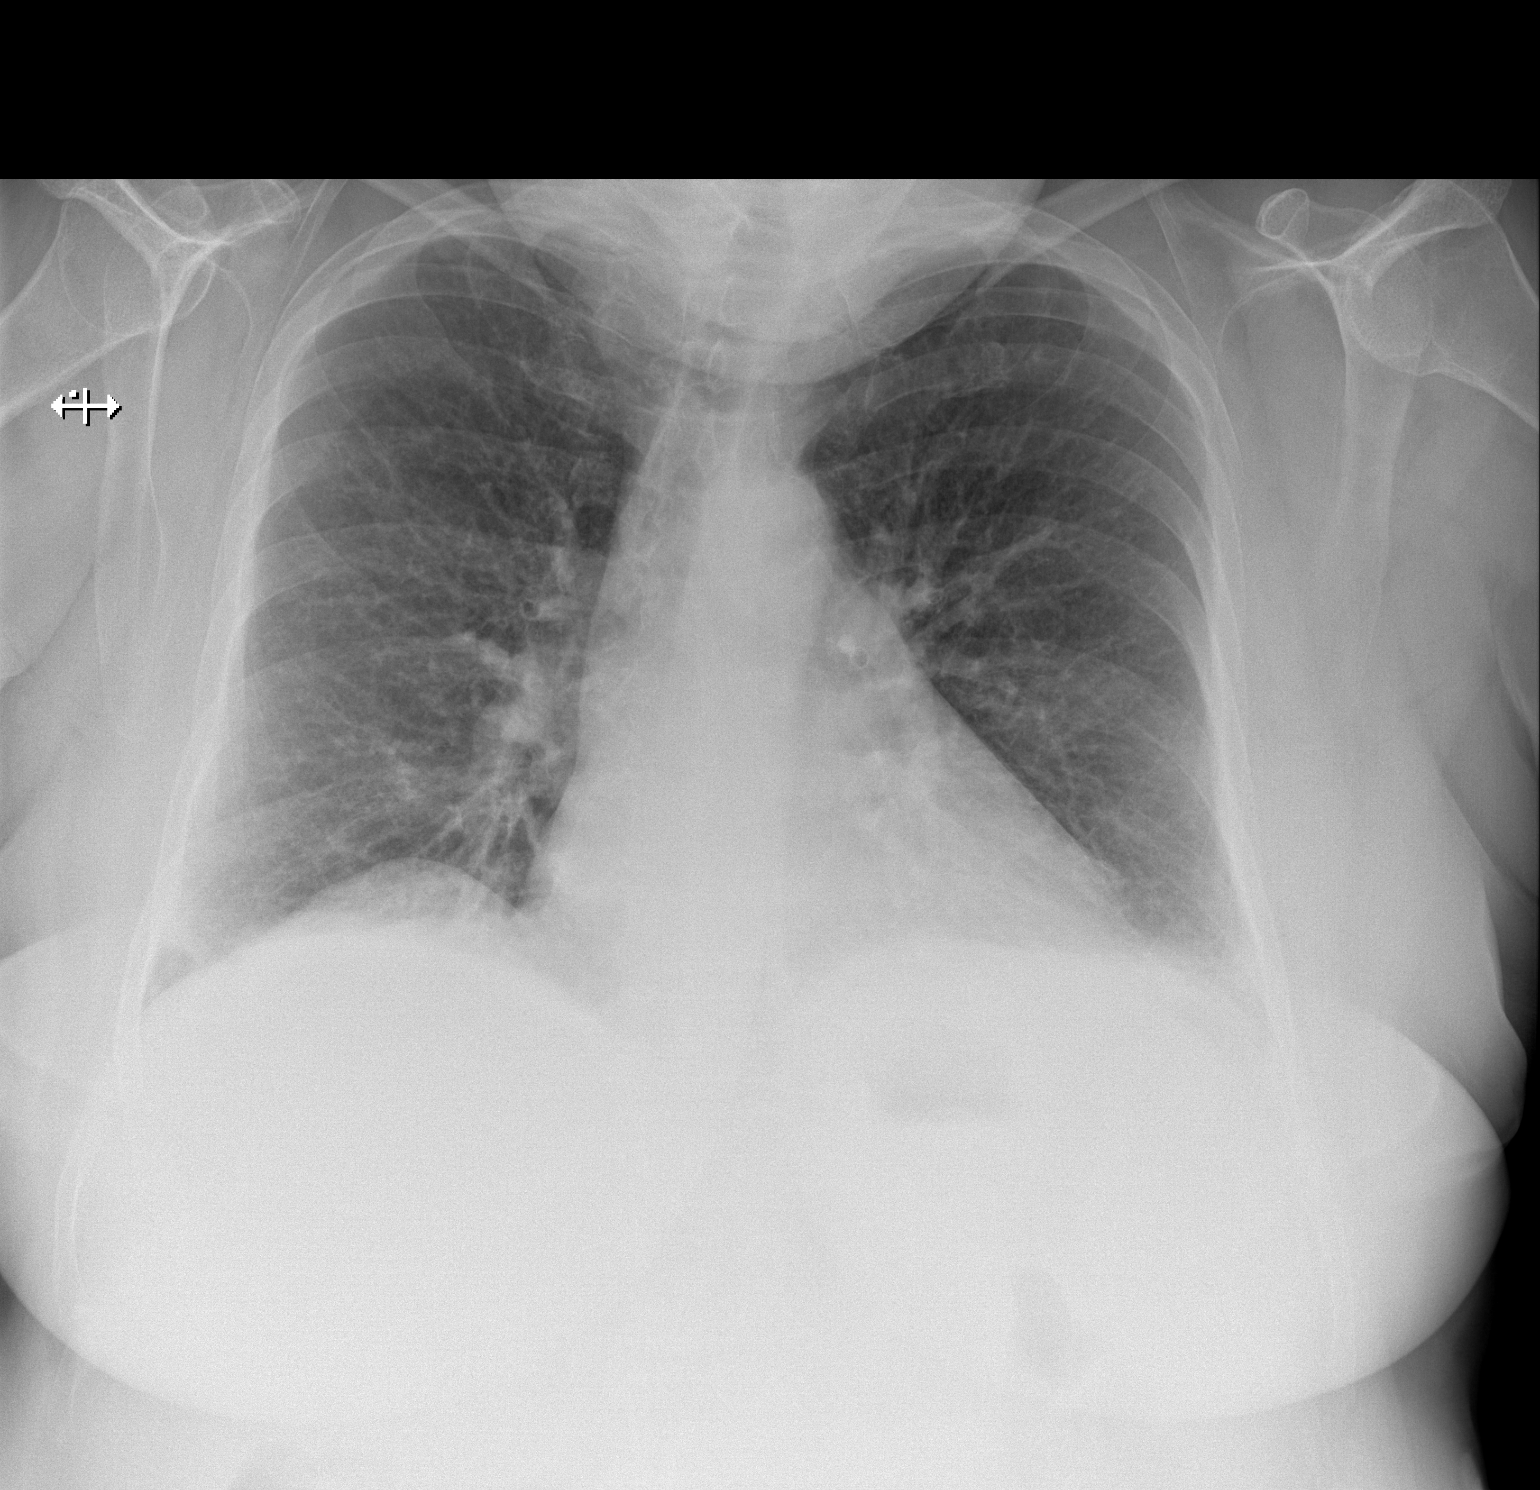

[w chest lat]
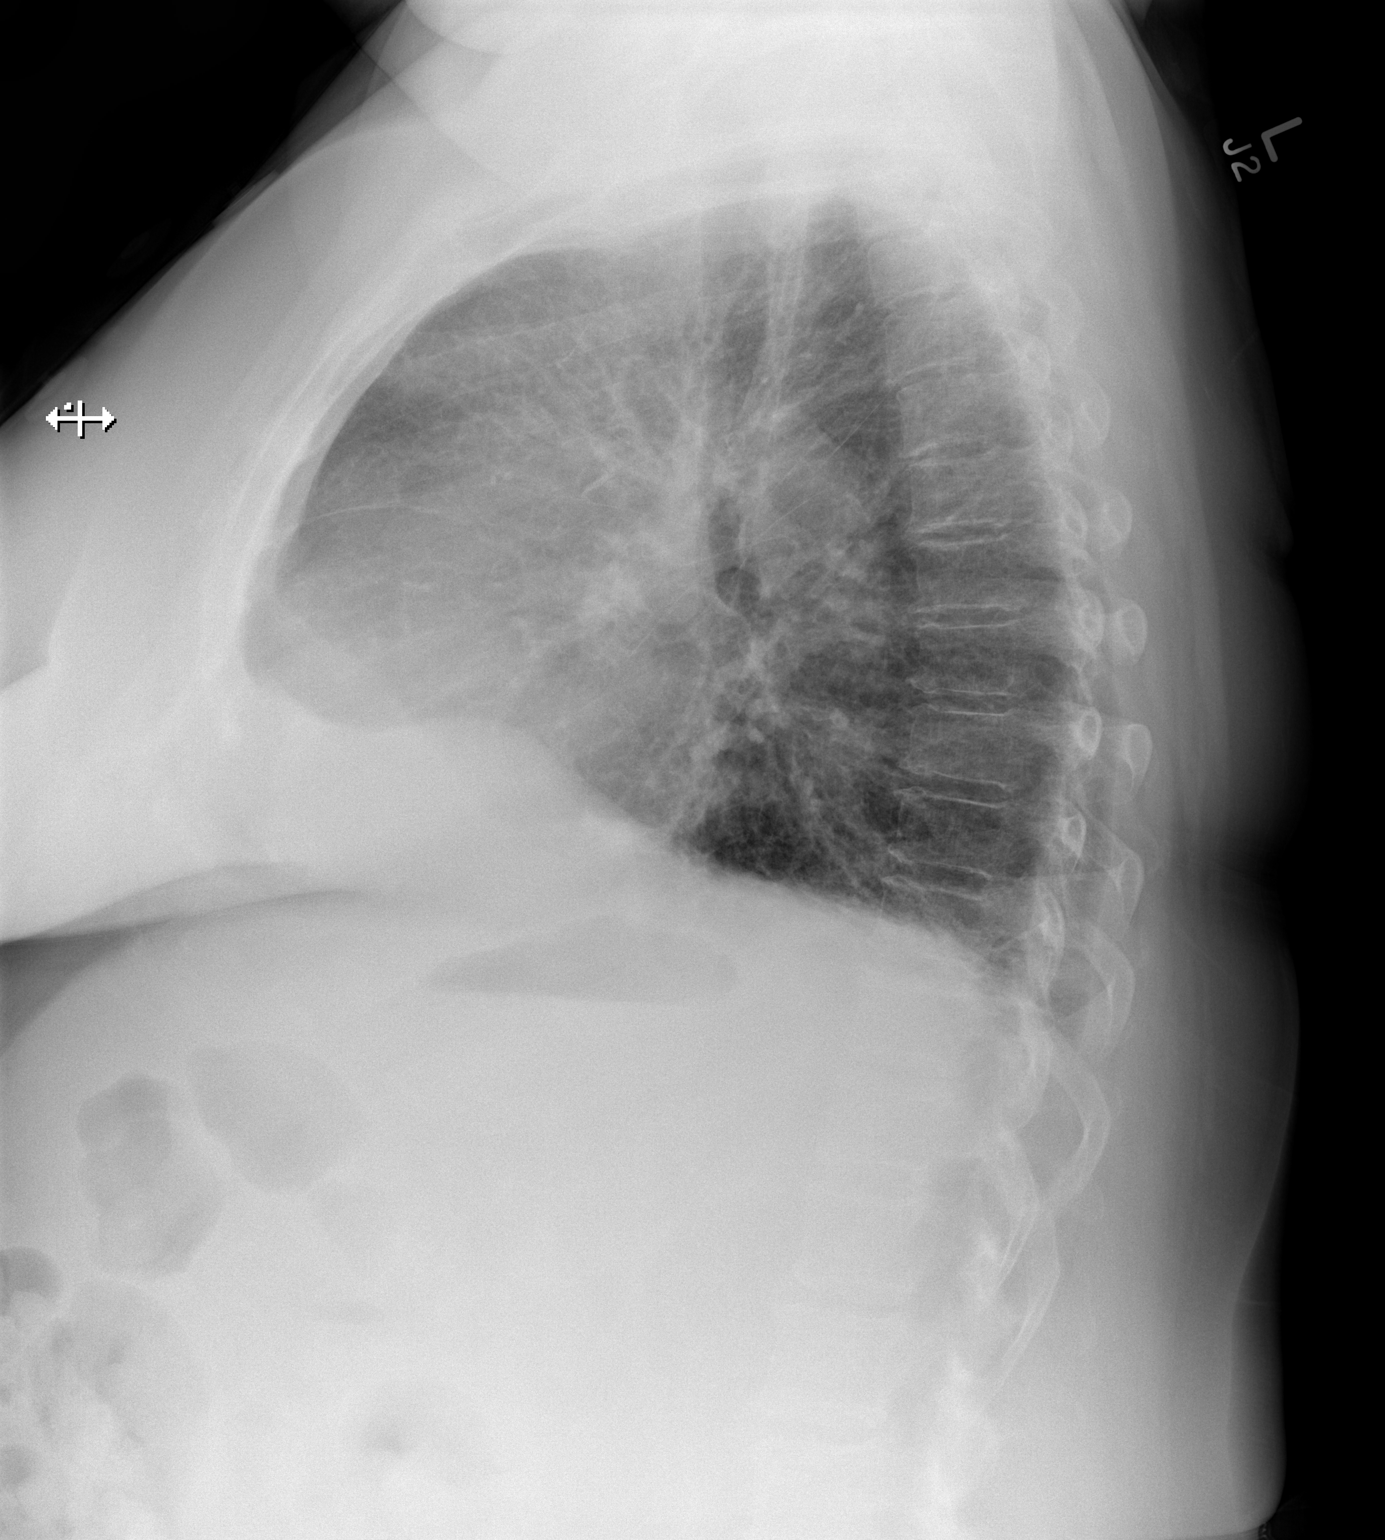

[2 of 2 positions shown; findings below may reference images not displayed]

FINDINGS: Grossly unchanged cardiac silhouette and mediastinal contours with
atherosclerotic plaque within the thoracic aorta. Lung volumes
remain reduced with grossly unchanged bibasilar heterogeneous
opacities favored to represent atelectasis or scar. There is
unchanged mild diffuse slightly nodular thickening of the pulmonary
interstitium. No discrete focal airspace opacities. No pleural
effusion or pneumothorax. There is persistent minimal pleural
parenchymal thickening about the bilateral major and the right minor
fissures. No evidence of edema. No acute osseous abnormalities.
IMPRESSION: Similar findings of chronic bronchitic change and hypoventilation
without definitive superimposed acute cardiopulmonary disease.

## 2017-03-23 MED FILL — CREON DR 24,000 UNITS CAP: 24000-76000 | 30 days supply | Qty: 90 | Fill #1

## 2017-03-24 ENCOUNTER — Other Ambulatory Visit: Payer: Self-pay | Admitting: Family Medicine

## 2017-03-24 DIAGNOSIS — I10 Essential (primary) hypertension: Secondary | ICD-10-CM

## 2017-03-25 ENCOUNTER — Other Ambulatory Visit: Payer: Self-pay | Admitting: Family

## 2017-03-25 DIAGNOSIS — I10 Essential (primary) hypertension: Secondary | ICD-10-CM

## 2017-03-25 MED ORDER — LOSARTAN POTASSIUM 100 MG PO TABS: 100.0000 mg | ORAL_TABLET | Freq: Every day | ORAL | 0 refills | Status: DC

## 2017-03-25 MED FILL — LOSARTAN POTASSIUM 100 MG T: 100 | 60 days supply | Qty: 60 | Fill #0

## 2017-03-25 NOTE — Telephone Encounter (Signed)
Caller name: Relationship to patient: Self Can be reached: [phone]  Pharmacy:  [facility], [address] [place] [phone] (Phone) [phone] (Fax)     Reason for call: Refill losartan (COZAAR) 100 MG tablet

## 2017-03-25 NOTE — Telephone Encounter (Signed)
30 day supply sent to pharmacy. Pt is past due for cpe with Melissa.  Please call pt to schedule appointment soon. Thanks!

## 2017-03-28 MED ORDER — LOSARTAN POTASSIUM 100 MG PO TABS: 100.0000 mg | ORAL_TABLET | Freq: Every day | ORAL | 0 refills | Status: DC

## 2017-03-28 NOTE — Telephone Encounter (Signed)
Patients spouse called in stating he would like medication sent to [facility] on [address] and all medications in future should go there except patients cream.

## 2017-03-28 NOTE — Telephone Encounter (Signed)
Left message on [facility] to cancel previous Rx of Losartan if not already cancelled and re-sent Rx to [facility]. Notified pt's spouse.

## 2017-04-11 DIAGNOSIS — Z880 Allergy status to penicillin: Secondary | ICD-10-CM | POA: Diagnosis not present

## 2017-04-11 DIAGNOSIS — J189 Pneumonia, unspecified organism: Secondary | ICD-10-CM | POA: Diagnosis not present

## 2017-04-11 DIAGNOSIS — D473 Essential (hemorrhagic) thrombocythemia: Secondary | ICD-10-CM | POA: Diagnosis not present

## 2017-04-11 DIAGNOSIS — Z91040 Latex allergy status: Secondary | ICD-10-CM | POA: Diagnosis not present

## 2017-04-11 DIAGNOSIS — Z955 Presence of coronary angioplasty implant and graft: Secondary | ICD-10-CM | POA: Diagnosis not present

## 2017-04-11 DIAGNOSIS — I2510 Atherosclerotic heart disease of native coronary artery without angina pectoris: Secondary | ICD-10-CM | POA: Diagnosis not present

## 2017-04-11 DIAGNOSIS — I501 Left ventricular failure: Secondary | ICD-10-CM | POA: Diagnosis not present

## 2017-04-11 DIAGNOSIS — J810 Acute pulmonary edema: Secondary | ICD-10-CM | POA: Diagnosis not present

## 2017-04-11 DIAGNOSIS — R0600 Dyspnea, unspecified: Secondary | ICD-10-CM | POA: Diagnosis not present

## 2017-04-11 DIAGNOSIS — K8689 Other specified diseases of pancreas: Secondary | ICD-10-CM | POA: Diagnosis not present

## 2017-04-11 DIAGNOSIS — R600 Localized edema: Secondary | ICD-10-CM | POA: Diagnosis not present

## 2017-04-11 DIAGNOSIS — E782 Mixed hyperlipidemia: Secondary | ICD-10-CM | POA: Diagnosis not present

## 2017-04-11 DIAGNOSIS — D45 Polycythemia vera: Secondary | ICD-10-CM | POA: Diagnosis not present

## 2017-04-11 DIAGNOSIS — N179 Acute kidney failure, unspecified: Secondary | ICD-10-CM | POA: Diagnosis not present

## 2017-04-11 DIAGNOSIS — R9431 Abnormal electrocardiogram [ECG] [EKG]: Secondary | ICD-10-CM | POA: Diagnosis not present

## 2017-04-11 DIAGNOSIS — D751 Secondary polycythemia: Secondary | ICD-10-CM | POA: Diagnosis not present

## 2017-04-11 DIAGNOSIS — J158 Pneumonia due to other specified bacteria: Secondary | ICD-10-CM | POA: Diagnosis not present

## 2017-04-11 DIAGNOSIS — J40 Bronchitis, not specified as acute or chronic: Secondary | ICD-10-CM | POA: Diagnosis not present

## 2017-04-11 DIAGNOSIS — Z743 Need for continuous supervision: Secondary | ICD-10-CM | POA: Diagnosis not present

## 2017-04-11 DIAGNOSIS — R0989 Other specified symptoms and signs involving the circulatory and respiratory systems: Secondary | ICD-10-CM | POA: Diagnosis not present

## 2017-04-11 DIAGNOSIS — E1165 Type 2 diabetes mellitus with hyperglycemia: Secondary | ICD-10-CM | POA: Diagnosis not present

## 2017-04-11 DIAGNOSIS — R0602 Shortness of breath: Secondary | ICD-10-CM | POA: Diagnosis not present

## 2017-04-11 DIAGNOSIS — J209 Acute bronchitis, unspecified: Secondary | ICD-10-CM | POA: Diagnosis not present

## 2017-04-11 DIAGNOSIS — E1122 Type 2 diabetes mellitus with diabetic chronic kidney disease: Secondary | ICD-10-CM | POA: Diagnosis not present

## 2017-04-11 DIAGNOSIS — D72829 Elevated white blood cell count, unspecified: Secondary | ICD-10-CM | POA: Diagnosis not present

## 2017-04-11 DIAGNOSIS — N189 Chronic kidney disease, unspecified: Secondary | ICD-10-CM | POA: Diagnosis not present

## 2017-04-11 DIAGNOSIS — I10 Essential (primary) hypertension: Secondary | ICD-10-CM | POA: Diagnosis not present

## 2017-04-11 DIAGNOSIS — R2242 Localized swelling, mass and lump, left lower limb: Secondary | ICD-10-CM | POA: Diagnosis not present

## 2017-04-11 DIAGNOSIS — J45901 Unspecified asthma with (acute) exacerbation: Secondary | ICD-10-CM | POA: Diagnosis not present

## 2017-04-11 DIAGNOSIS — Z794 Long term (current) use of insulin: Secondary | ICD-10-CM | POA: Diagnosis not present

## 2017-04-11 DIAGNOSIS — I340 Nonrheumatic mitral (valve) insufficiency: Secondary | ICD-10-CM | POA: Diagnosis not present

## 2017-04-11 DIAGNOSIS — I5040 Unspecified combined systolic (congestive) and diastolic (congestive) heart failure: Secondary | ICD-10-CM | POA: Diagnosis not present

## 2017-04-11 DIAGNOSIS — E785 Hyperlipidemia, unspecified: Secondary | ICD-10-CM | POA: Diagnosis not present

## 2017-04-11 DIAGNOSIS — D6489 Other specified anemias: Secondary | ICD-10-CM | POA: Diagnosis not present

## 2017-04-11 DIAGNOSIS — I509 Heart failure, unspecified: Secondary | ICD-10-CM | POA: Diagnosis not present

## 2017-04-11 DIAGNOSIS — I129 Hypertensive chronic kidney disease with stage 1 through stage 4 chronic kidney disease, or unspecified chronic kidney disease: Secondary | ICD-10-CM | POA: Diagnosis not present

## 2017-04-12 DIAGNOSIS — R0600 Dyspnea, unspecified: Secondary | ICD-10-CM | POA: Diagnosis not present

## 2017-04-12 DIAGNOSIS — I129 Hypertensive chronic kidney disease with stage 1 through stage 4 chronic kidney disease, or unspecified chronic kidney disease: Secondary | ICD-10-CM | POA: Diagnosis not present

## 2017-04-12 DIAGNOSIS — N179 Acute kidney failure, unspecified: Secondary | ICD-10-CM | POA: Diagnosis not present

## 2017-04-12 DIAGNOSIS — E1165 Type 2 diabetes mellitus with hyperglycemia: Secondary | ICD-10-CM | POA: Diagnosis not present

## 2017-04-12 DIAGNOSIS — J209 Acute bronchitis, unspecified: Secondary | ICD-10-CM | POA: Diagnosis not present

## 2017-04-12 DIAGNOSIS — J189 Pneumonia, unspecified organism: Secondary | ICD-10-CM | POA: Diagnosis not present

## 2017-04-12 DIAGNOSIS — R0989 Other specified symptoms and signs involving the circulatory and respiratory systems: Secondary | ICD-10-CM | POA: Diagnosis not present

## 2017-04-13 DIAGNOSIS — J189 Pneumonia, unspecified organism: Secondary | ICD-10-CM | POA: Diagnosis not present

## 2017-04-13 DIAGNOSIS — I129 Hypertensive chronic kidney disease with stage 1 through stage 4 chronic kidney disease, or unspecified chronic kidney disease: Secondary | ICD-10-CM | POA: Diagnosis not present

## 2017-04-13 DIAGNOSIS — Z794 Long term (current) use of insulin: Secondary | ICD-10-CM | POA: Diagnosis not present

## 2017-04-13 DIAGNOSIS — N179 Acute kidney failure, unspecified: Secondary | ICD-10-CM | POA: Diagnosis not present

## 2017-04-13 DIAGNOSIS — E1165 Type 2 diabetes mellitus with hyperglycemia: Secondary | ICD-10-CM | POA: Diagnosis not present

## 2017-04-13 DIAGNOSIS — J209 Acute bronchitis, unspecified: Secondary | ICD-10-CM | POA: Diagnosis not present

## 2017-04-14 DIAGNOSIS — N179 Acute kidney failure, unspecified: Secondary | ICD-10-CM | POA: Diagnosis not present

## 2017-04-14 DIAGNOSIS — E1165 Type 2 diabetes mellitus with hyperglycemia: Secondary | ICD-10-CM | POA: Diagnosis not present

## 2017-04-14 DIAGNOSIS — J209 Acute bronchitis, unspecified: Secondary | ICD-10-CM | POA: Diagnosis not present

## 2017-04-14 DIAGNOSIS — J189 Pneumonia, unspecified organism: Secondary | ICD-10-CM | POA: Diagnosis not present

## 2017-04-14 DIAGNOSIS — I129 Hypertensive chronic kidney disease with stage 1 through stage 4 chronic kidney disease, or unspecified chronic kidney disease: Secondary | ICD-10-CM | POA: Diagnosis not present

## 2017-04-18 ENCOUNTER — Telehealth: Payer: Self-pay | Admitting: *Deleted

## 2017-04-18 NOTE — Telephone Encounter (Signed)
Husband notifying the office of the follow:  Patient was admitted to the hospital while in [place]. Per the husband there was no definitive diagnosis received however he mentioned being treated for CHF, pulmonary edema, Kidney failure and out of control blood sugars. She was discharged on Saturday and flew home yesterday. He states the hospital told her she needed to see her doctor right away. He wants to know if she can be seen today.  Reviewed all the mentioned diagnoses with the husband. Patient has a cardiologist and an MD which manages her diabetes. Instructed husband to call these physicians to set up urgent appointments as her diagnoses are not related to her polycythemia which this office treats.   Spoke to Dr [name]. We will also bring her in to be seen to sooner follow up.   Reviewed with husband again the importance of the other physicians to assess the patient, but that Dr [name] would also see the patient after her hospitalization later this week.   Spoke with husband and patient has appointments with both her PCP and her Cardiologist this week. We will schedule her follow up with [place] early next week.

## 2017-04-19 ENCOUNTER — Encounter: Payer: Self-pay | Admitting: Family

## 2017-04-19 ENCOUNTER — Telehealth: Payer: Self-pay

## 2017-04-19 ENCOUNTER — Ambulatory Visit (INDEPENDENT_AMBULATORY_CARE_PROVIDER_SITE_OTHER): Payer: Medicare Other | Admitting: Family

## 2017-04-19 ENCOUNTER — Ambulatory Visit (HOSPITAL_BASED_OUTPATIENT_CLINIC_OR_DEPARTMENT_OTHER)
Admission: RE | Admit: 2017-04-19 | Discharge: 2017-04-19 | Disposition: A | Discharge status: Home / Self Care | Payer: Medicare Other | Source: Ambulatory Visit | Attending: Family | Admitting: Family

## 2017-04-19 VITALS — BP 137/59 | HR 83 | Temp 98.3°F | Resp 18 | Ht 64.5 in | Wt 217.0 lb

## 2017-04-19 DIAGNOSIS — N189 Chronic kidney disease, unspecified: Secondary | ICD-10-CM | POA: Diagnosis not present

## 2017-04-19 DIAGNOSIS — N2889 Other specified disorders of kidney and ureter: Secondary | ICD-10-CM | POA: Diagnosis not present

## 2017-04-19 DIAGNOSIS — D45 Polycythemia vera: Secondary | ICD-10-CM | POA: Diagnosis not present

## 2017-04-19 DIAGNOSIS — D649 Anemia, unspecified: Secondary | ICD-10-CM

## 2017-04-19 DIAGNOSIS — N289 Disorder of kidney and ureter, unspecified: Secondary | ICD-10-CM

## 2017-04-19 DIAGNOSIS — N3000 Acute cystitis without hematuria: Secondary | ICD-10-CM | POA: Diagnosis not present

## 2017-04-19 LAB — URINALYSIS, ROUTINE W REFLEX MICROSCOPIC
BILIRUBIN URINE: NEGATIVE
HGB URINE DIPSTICK: NEGATIVE
Ketones, ur: NEGATIVE
NITRITE: NEGATIVE
Specific Gravity, Urine: 1.01 (ref 1.000–1.030)
Total Protein, Urine: NEGATIVE
Urine Glucose: NEGATIVE
Urobilinogen, UA: 0.2 (ref 0.0–1.0)
pH: 6 (ref 5.0–8.0)

## 2017-04-19 LAB — COMPREHENSIVE METABOLIC PANEL
ALK PHOS: 103 U/L (ref 39–117)
ALT: 17 U/L (ref 0–35)
AST: 39 U/L — ABNORMAL HIGH (ref 0–37)
Albumin: 4.1 g/dL (ref 3.5–5.2)
BUN: 34 mg/dL — ABNORMAL HIGH (ref 6–23)
CO2: 26 mEq/L (ref 19–32)
Calcium: 9.1 mg/dL (ref 8.4–10.5)
Chloride: 106 mEq/L (ref 96–112)
Creatinine, Ser: 1.3 mg/dL — ABNORMAL HIGH (ref 0.40–1.20)
GFR: 42.14 mL/min — AB (ref >60.00)
GLUCOSE: 102 mg/dL — AB (ref 70–99)
Potassium: 4.3 mEq/L (ref 3.5–5.1)
Sodium: 140 mEq/L (ref 135–145)
TOTAL PROTEIN: 7.2 g/dL (ref 6.0–8.3)
Total Bilirubin: 0.8 mg/dL (ref 0.2–1.2)

## 2017-04-19 LAB — CBC WITH DIFFERENTIAL/PLATELET
Basophils Absolute: 0.2 10*3/uL — ABNORMAL HIGH (ref 0.0–0.1)
Basophils Relative: 0.6 % (ref 0.0–3.0)
EOS PCT: 1.8 % (ref 0.0–5.0)
Eosinophils Absolute: 0.5 10*3/uL (ref 0.0–0.7)
HCT: 36.8 % (ref 36.0–46.0)
HEMOGLOBIN: 12.1 g/dL (ref 12.0–15.0)
LYMPHS ABS: 4.6 10*3/uL — AB (ref 0.7–4.0)
Lymphocytes Relative: 15.8 % (ref 12.0–46.0)
MCHC: 32.8 g/dL (ref 30.0–36.0)
MCV: 89.8 fl (ref 78.0–100.0)
MONOS PCT: 4.6 % (ref 3.0–12.0)
Monocytes Absolute: 1.4 10*3/uL — ABNORMAL HIGH (ref 0.1–1.0)
NEUTROS PCT: 77.2 % — AB (ref 43.0–77.0)
Neutro Abs: 22.6 10*3/uL — ABNORMAL HIGH (ref 1.4–7.7)
Platelets: 860 10*3/uL — ABNORMAL HIGH (ref 150.0–400.0)
RBC: 4.1 Mil/uL (ref 3.87–5.11)
RDW: 16.8 % — ABNORMAL HIGH (ref 11.5–15.5)
WBC: 29.3 10*3/uL (ref 4.0–10.5)

## 2017-04-19 NOTE — Progress Notes (Signed)
Subjective:    Patient ID: Amanda [name], female    DOB: 03-13-1941, 76 y.o.   MRN: [id number]  HPI   Amanda [name] is a 76 year old female who presents today for hospital follow-up. She brings with her today some of her records from her recent hospitalization. She was hospitalized at the [facility]. Review of her laboratory results show evidence of urinary tract infection during her admission as well as acute renal failure with a creatinine as high as 4. She did have a left lower extremity Doppler which was negative for DVT. In addition she had a renal ultrasound which noted no evidence of hydronephrosis. Note was made of a 1. centimeter mixed echo focus in the midpole medially in the right kidney.  Patient went to her Nephew's graduation. Developed URI.  Went to Urgent care.  Urgent care called an ambulance due to an abnormal EKG. She was admitted for observation.  She was told that she had "fluid" but was told that she did not have MI.  She reports that she was treated with abx and oxygen. She reports that she had an infection "in my body but they could not find it."  She returned home Sunday. She reports that she is feeling well.  Has been tolerating PO's.  Has been sleeping more than usual though.  She thinks she might have had a fever on Sunday night.  Had "chills." Urinating without difficulty. Denies dysuria, chest pain or shortness of breath.  She reports that she is mildly hoarse. She has upcoming appointment with Dr. [name] hematology and Cardiology.   Review of Systems See HPI  Past Medical History:  Diagnosis Date  . Allergic rhinitis 01/23/2016  . C. difficile colitis   . CAD (coronary artery disease)    a. s/p STEMI in 01/2015 with 95% LCx stenosis and distal 80% LCx stenosis (DESx2 placed)  . Cancer ([place])    SKIN  . Candidiasis of skin 09/30/2014  . Cirrhosis ([place])   . Depression   . Diabetes mellitus 2008  . Gout   . Herpes simplex   .  Hyperglycemia 05/31/2013  . Hyperlipidemia   . Hypertension   . Myocardial infarction ([place])   . 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 Obesity   . Polycythemia    Dr. Elease Hashimoto- [facility] hematology  . Psoriasis      Social History   Social History  . Marital status: Married    Spouse name: N/A  . Number of children: 2  . Years of education: 2 yr colle   Occupational History  . housewife    Social History Main Topics  . Smoking status: Former Smoker    Years: 48.00    Types: Cigarettes    Quit date: 10/18/1986  . Smokeless tobacco: Never Used  . Alcohol use No  . Drug use: No  . Sexual activity: Yes    Partners: Male    Birth control/ protection: None   Other Topics Concern  . Not on file   Social History Narrative   2 caffeine drink per day.  No regular exercise.     Retired from the bank   2 children (Daughter and a son) son has morbid obesity   2 grandchildren   3 great grandchildren    Past Surgical History:  Procedure Laterality Date  . BREAST SURGERY Left    milk duct  . COLONOSCOPY    . LEFT HEART CATHETERIZATION WITH CORONARY  ANGIOGRAM N/A 01/27/2015   Procedure: LEFT HEART CATHETERIZATION WITH CORONARY ANGIOGRAM;  Surgeon: [name], MD;  Location: [facility] CATH LAB;  Service: Cardiovascular;  Laterality: N/A;  . TONSILLECTOMY    . TOTAL ABDOMINAL HYSTERECTOMY W/ BILATERAL SALPINGOOPHORECTOMY     for heavy periods with appendectomy    Family History  Problem Relation Age of Onset  . Diabetes Mother   . Heart disease Mother        CAD  . Hyperlipidemia Mother   . Hypertension Mother   . Kidney disease Mother   . Kidney disease Father   . Breast cancer Maternal Aunt   . Heart disease Maternal Grandmother   . Heart disease Other        maternal aunts and uncles    Allergies  Allergen Reactions  . Lisinopril Cough  . Tape Hives  . Doxycycline Hives, Swelling and Rash  . Latex Hives, Itching and Rash    Current Outpatient Prescriptions on  File Prior to Visit  Medication Sig Dispense Refill  . amLODipine (NORVASC) 5 MG tablet Take 1 tablet (5 mg total) by mouth daily. 30 tablet 1  . aspirin EC 81 MG tablet Take 81 mg by mouth daily.    . diphenoxylate-atropine (LOMOTIL) 2.5-0.025 MG tablet Take 1-2 tablets by mouth 2 (two) times daily as needed for diarrhea or loose stools. 60 tablet 1  . furosemide (LASIX) 20 MG tablet Take 1 tablet (20 mg total) by mouth daily. As needed for Edema 30 tablet 6  . Insulin Pen Needle 32G X 4 MM MISC Use to inject insulins. 100 each 3  . insulin regular human CONCENTRATED (HUMULIN R U-500 KWIKPEN) 500 UNIT/ML kwikpen Inject 100 units under the skin before breakfast and 85 units before dinner. (Patient taking differently: Inject 40 units under the skin before breakfast and 45 units before dinner.) 18 pen 1  . loperamide (IMODIUM A-D) 2 MG tablet Take 1-2 tablets (2-4 mg total) by mouth daily as needed for diarrhea or loose stools. 30 tablet 1  . losartan (COZAAR) 100 MG tablet Take 1 tablet (100 mg total) by mouth daily. 30 tablet 0  . nitroGLYCERIN (NITROSTAT) 0.4 MG SL tablet Place 1 tablet (0.4 mg total) under the tongue every 5 (five) minutes as needed for chest pain. 25 tablet 3  . ONETOUCH VERIO test strip Use to test blood sugar 3 times daily.    . Pancrelipase, Lip-Prot-Amyl, 24000-76000 units CPEP Take 1 capsule (24,000 Units total) by mouth 3 (three) times daily before meals. 90 capsule 1  . promethazine (PHENERGAN) 25 MG tablet Take 0.5 tablets (12.5 mg total) by mouth every 6 (six) hours as needed for nausea or vomiting. 30 tablet 1  . rosuvastatin (CRESTOR) 20 MG tablet TAKE 1 TABLET(20 MG) BY MOUTH DAILY 30 tablet 6  . ruxolitinib phosphate (JAKAFI) 25 MG tablet Take 1 tablet (25 mg total) by mouth 2 (two) times daily. 60 tablet 11   No current facility-administered medications on file prior to visit.     BP (!) 137/59 (BP Location: Right Arm, Cuff Size: Large)   Pulse 83   Temp 98.3  F (36.8 C) (Oral)   Resp 18   Ht 5' 4.5" (1.638 m)   Wt 217 lb (98.4 kg)   SpO2 97%   BMI 36.67 kg/m       Objective:   Physical Exam  Constitutional: She is oriented to person, place, and time. She appears well-developed and well-nourished.  HENT:  Head: Normocephalic and atraumatic.  Cardiovascular: Normal rate, regular rhythm and normal heart sounds.   No murmur heard. Pulmonary/Chest: Effort normal and breath sounds normal. No respiratory distress. She has no wheezes.  Musculoskeletal:  Trace bilateral LE edema  Neurological: She is alert and oriented to person, place, and time.  Skin: Skin is warm and dry.  Psychiatric: She has a normal mood and affect. Her behavior is normal. Judgment and thought content normal.          Assessment & Plan:  Acute Renal failure- during her hospitalization. Obtain follow up bmet to assess renal function.She brings with her today some of her hospital records, however I do not have all of them and I do not have any records from any of her progress notes during this visit. We will request that complete set of her hospitalization records from the facility. Records release is signed today. We will place a referral to nephrology. Will also check a urinalysis and urine culture.  Kidney mass- will obtain a follow up US to further evaluate.   Hx of polycythemia vera- received critical result that her WBC was 29.3.  I did review her notes from hematology and this is consistent with past results that they have reviewed. She was afebrile today. Will forward results to hematology when they are finalized. I wonder if her report that she was told she had an "infection somewhere in her body" during her hospitalization was due to her leukocytosis. They did not have access to her baseline laboratories.  Hx of CAD- she is advised to keep her upcoming appointment with cardiology.

## 2017-04-19 NOTE — Patient Instructions (Addendum)
Please complete lab work prior to leaving. We will work on getting you set up with a kidney doctor locally.  Please keep your upcoming appointments with cardiology and hematology.  Follow up in 1 month.

## 2017-04-19 NOTE — Telephone Encounter (Signed)
Called to notify patient we had medication at the office for the Humulin 500 pens that needs to be picked up from [facility].  LVM and advised her to call back if any questions.

## 2017-04-21 ENCOUNTER — Telehealth: Payer: Self-pay | Admitting: Family

## 2017-04-21 DIAGNOSIS — N2889 Other specified disorders of kidney and ureter: Secondary | ICD-10-CM

## 2017-04-21 LAB — URINE CULTURE: ORGANISM ID, BACTERIA: NO GROWTH

## 2017-04-21 NOTE — Telephone Encounter (Signed)
Left message with pt's spouse to have pt return my call.

## 2017-04-21 NOTE — Telephone Encounter (Signed)
In addition, please let the patient her white blood cell count is quite high. However I compared this to her previous blood counts which have been done with Dr. Lutricia Feil and it has been this high in the past. I believe that it is likely secondary to her history of polycythemia vera, and I have sent her results to [facility] in hematology for her review as well. Her kidney function is improving. Urine culture was negative for infection.

## 2017-04-21 NOTE — Telephone Encounter (Signed)
I reviewed renal US. Radiologist is recommending an MRI to further evaluate a mass in her right kidney.  MRI has been pended.

## 2017-04-21 NOTE — Telephone Encounter (Signed)
Notified pt and she voices understanding. Sees cardiologist tomorrow and has appt with Dr [name] on 04/27/17. Pt is agreeable to proceed with MRI. Order signed.

## 2017-04-22 ENCOUNTER — Ambulatory Visit (INDEPENDENT_AMBULATORY_CARE_PROVIDER_SITE_OTHER): Payer: Medicare Other | Admitting: Physician Assistant

## 2017-04-22 ENCOUNTER — Telehealth: Payer: Self-pay | Admitting: Internal Medicine

## 2017-04-22 ENCOUNTER — Encounter: Payer: Self-pay | Admitting: Physician Assistant

## 2017-04-22 ENCOUNTER — Other Ambulatory Visit: Payer: Self-pay | Admitting: Family

## 2017-04-22 VITALS — BP 124/82 | HR 96 | Ht 64.5 in | Wt 218.0 lb

## 2017-04-22 DIAGNOSIS — E785 Hyperlipidemia, unspecified: Secondary | ICD-10-CM | POA: Diagnosis not present

## 2017-04-22 DIAGNOSIS — I2510 Atherosclerotic heart disease of native coronary artery without angina pectoris: Secondary | ICD-10-CM | POA: Diagnosis not present

## 2017-04-22 DIAGNOSIS — I5032 Chronic diastolic (congestive) heart failure: Secondary | ICD-10-CM | POA: Diagnosis not present

## 2017-04-22 DIAGNOSIS — Z794 Long term (current) use of insulin: Secondary | ICD-10-CM

## 2017-04-22 DIAGNOSIS — I10 Essential (primary) hypertension: Secondary | ICD-10-CM | POA: Diagnosis not present

## 2017-04-22 DIAGNOSIS — N2889 Other specified disorders of kidney and ureter: Secondary | ICD-10-CM

## 2017-04-22 DIAGNOSIS — D45 Polycythemia vera: Secondary | ICD-10-CM | POA: Diagnosis not present

## 2017-04-22 DIAGNOSIS — N289 Disorder of kidney and ureter, unspecified: Secondary | ICD-10-CM | POA: Diagnosis not present

## 2017-04-22 DIAGNOSIS — E119 Type 2 diabetes mellitus without complications: Secondary | ICD-10-CM

## 2017-04-22 NOTE — Telephone Encounter (Signed)
Patients husband is requesting that we send the freestyle libra with sensor patch to the [facility] on file.

## 2017-04-22 NOTE — Progress Notes (Signed)
Cardiology Office Note    Date:  04/23/2017   ID:  Amanda [name], DOB August 21, 1941, MRN [id number]  PCP:  Debbrah Alar, NP  Cardiologist:  Dr. [name]   Chief Complaint  Patient presents with  . Hospitalization Follow-up    seen for Dr. [name], recent admission at [facility]    History of Present Illness:  Amanda [name] is a 76 y.o. female with PMH of CAD s/p STEMI in 01/2015 with DES x2 to LCx, HTN, HLD, DM II, OSA intolerant to CPAP and polycythemia vera. She was last seen by Amanda [name] on 02/28/2017. She did have recent lower extremity edema, venous Doppler obtained on 02/14/2017 showed no evidence of DVT. Since she is more than 2 years out from the stent placement, Brilinta was stopped as it became very costly for the patient. She was started on 20 mg PRN dose of Lasix. According to office note by Amanda [name] on 04/19/2017, she was admitted to [facility]. She was treated for urinary tract infection and also had acute renal failure with creatinine as high as 4. She did have left lower extremity Doppler which was negative for DVT. In addition renal ultrasound showed no evidence of hydronephrosis. Basic metabolic panel was repeated, creatinine came down to 1.3 on 7/3. CBC showed white blood cell count 29.3, platelet 860. Repeat UA was negative for UTI. Renal ultrasound obtained on the same day showed a questionable 2.7 x 2.5 x 2.7 cm solid hypoechoic mass in the midportion of the right kidney. Renal MRI was recommended.  Patient presents today for cardiology office evaluation. She mentions everyone in her family was coughing a week prior to her hospitalization. It is very possible, there was a viral outbreak. She says for the entire week, she did not eat and that she was barely drinking anything at all. This may have contributed to her acute kidney injury. She says she was barely using the as needed dose of Lasix, and a take 1 dose perhaps every  2 weeks at most. I do not think the amount of Lasix is responsible to her acute kidney injury. For some reason, she says doctors at [facility] told her it was her insulin that was causing the kidney injury, her insulin dose was lowered. I am not entirely sure the reasoning behind this, and will need full records before we can make further diagnosis. Record has been requested by her primary care provider. Based on paperwork she brought with her, her creatinine peaked at 4.0 before trending back down. Chest x-ray showed vascular congestion however did not mention significant interstitial edema. Venous Doppler was negative for DVT. White blood cell count was elevated at her usual level of around 20,000. She says she was hospitalized for 8 days before discharge. On today's physical exam, she has bilateral basilar crackles. She does not have significant lower extremity edema. Her bilateral basilar crackles more likely atelectasis than fluid. She denies any significant lower extremity edema, orthopnea or PND. Her cough has completely resolved. Given lack of symptom, I will hold off on chest x-ray at this time. She is undergoing further workup for the right renal mass. Interestingly, her right renal mass seen in [facility] was about 1.7 cm in diameter whereas US obtained in [place] a few days later indicate a 2.7 cm mass.    Past Medical History:  Diagnosis Date  . Allergic rhinitis 01/23/2016  . C. difficile colitis   .  CAD (coronary artery disease)    a. s/p STEMI in 01/2015 with 95% LCx stenosis and distal 80% LCx stenosis (DESx2 placed)  . Cancer ([place])    SKIN  . Candidiasis of skin 09/30/2014  . Cirrhosis ([place])   . Depression   . Diabetes mellitus 2008  . Gout   . Herpes simplex   . Hyperglycemia 05/31/2013  . Hyperlipidemia   . Hypertension   . Myocardial infarction ([place])   . 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 Obesity   . Polycythemia    Dr. Elease Hashimoto- [facility]  hematology  . Psoriasis     Past Surgical History:  Procedure Laterality Date  . BREAST SURGERY Left    milk duct  . COLONOSCOPY    . LEFT HEART CATHETERIZATION WITH CORONARY ANGIOGRAM N/A 01/27/2015   Procedure: LEFT HEART CATHETERIZATION WITH CORONARY ANGIOGRAM;  Surgeon: [name], MD;  Location: [facility] CATH LAB;  Service: Cardiovascular;  Laterality: N/A;  . TONSILLECTOMY    . TOTAL ABDOMINAL HYSTERECTOMY W/ BILATERAL SALPINGOOPHORECTOMY     for heavy periods with appendectomy    Current Medications: Outpatient Medications Prior to Visit  Medication Sig Dispense Refill  . amLODipine (NORVASC) 5 MG tablet Take 1 tablet (5 mg total) by mouth daily. 30 tablet 1  . aspirin EC 81 MG tablet Take 81 mg by mouth daily.    . diphenoxylate-atropine (LOMOTIL) 2.5-0.025 MG tablet Take 1-2 tablets by mouth 2 (two) times daily as needed for diarrhea or loose stools. 60 tablet 1  . furosemide (LASIX) 20 MG tablet Take 1 tablet (20 mg total) by mouth daily. As needed for Edema 30 tablet 6  . Insulin Pen Needle 32G X 4 MM MISC Use to inject insulins. 100 each 3  . insulin regular human CONCENTRATED (HUMULIN R U-500 KWIKPEN) 500 UNIT/ML kwikpen Inject 100 units under the skin before breakfast and 85 units before dinner. (Patient taking differently: Inject 40 units under the skin before breakfast and 45 units before dinner.) 18 pen 1  . loperamide (IMODIUM A-D) 2 MG tablet Take 1-2 tablets (2-4 mg total) by mouth daily as needed for diarrhea or loose stools. 30 tablet 1  . nitroGLYCERIN (NITROSTAT) 0.4 MG SL tablet Place 1 tablet (0.4 mg total) under the tongue every 5 (five) minutes as needed for chest pain. 25 tablet 3  . ONETOUCH VERIO test strip Use to test blood sugar 3 times daily.    . Pancrelipase, Lip-Prot-Amyl, 24000-76000 units CPEP Take 1 capsule (24,000 Units total) by mouth 3 (three) times daily before meals. 90 capsule 1  . promethazine (PHENERGAN) 25 MG tablet Take 0.5 tablets (12.5 mg  total) by mouth every 6 (six) hours as needed for nausea or vomiting. 30 tablet 1  . rosuvastatin (CRESTOR) 20 MG tablet TAKE 1 TABLET(20 MG) BY MOUTH DAILY 30 tablet 6  . ruxolitinib phosphate (JAKAFI) 25 MG tablet Take 1 tablet (25 mg total) by mouth 2 (two) times daily. 60 tablet 11  . losartan (COZAAR) 100 MG tablet Take 1 tablet (100 mg total) by mouth daily. 30 tablet 0   No facility-administered medications prior to visit.      Allergies:   Lisinopril; Tape; Doxycycline; and Latex   Social History   Social History  . Marital status: Married    Spouse name: N/A  . Number of children: 2  . Years of education: 2 yr colle   Occupational History  . housewife    Social History  Main Topics  . Smoking status: Former Smoker    Years: 48.00    Types: Cigarettes    Quit date: 10/18/1986  . Smokeless tobacco: Never Used  . Alcohol use No  . Drug use: No  . Sexual activity: Yes    Partners: Male    Birth control/ protection: None   Other Topics Concern  . None   Social History Narrative   2 caffeine drink per day.  No regular exercise.     Retired from the bank   2 children (Daughter and a son) son has morbid obesity   2 grandchildren   3 great grandchildren     Family History:  The patient's family history includes Breast cancer in her maternal aunt; Diabetes in her mother; Heart disease in her maternal grandmother, mother, and other; Hyperlipidemia in her mother; Hypertension in her mother; Kidney disease in her father and mother.   ROS:   Please see the history of present illness.    ROS All other systems reviewed and are negative.   PHYSICAL EXAM:   VS:  BP 124/82   Pulse 96   Ht 5' 4.5" (1.638 m)   Wt 218 lb (98.9 kg)   BMI 36.84 kg/m    GEN: Well nourished, well developed, in no acute distress  HEENT: normal  Neck: no JVD, carotid bruits, or masses Cardiac: RRR; no murmurs, rubs, or gallops,no edema  Respiratory:  Bibasilar crackle GI: soft, nontender,  nondistended, + BS MS: no deformity or atrophy  Skin: warm and dry, no rash Neuro:  Alert and Oriented x 3, Strength and sensation are intact Psych: euthymic mood, full affect  Wt Readings from Last 3 Encounters:  04/22/17 218 lb (98.9 kg)  04/19/17 217 lb (98.4 kg)  02/28/17 227 lb (103 kg)      Studies/Labs Reviewed:   EKG:  EKG is not ordered today.  Recent Labs: 02/25/2017: TSH 3.304 04/19/2017: ALT 17; BUN 34; Creatinine, Ser 1.30; Hemoglobin 12.1; Platelets 860.0; Potassium 4.3; Sodium 140   Lipid Panel    Component Value Date/Time   CHOL 207 (H) 02/14/2017 1122   TRIG 243.0 (H) 02/14/2017 1122   HDL 34.00 (L) 02/14/2017 1122   CHOLHDL 6 02/14/2017 1122   VLDL 48.6 (H) 02/14/2017 1122   LDLCALC 88 01/27/2015 1320   LDLDIRECT 103.0 02/14/2017 1122    Additional studies/ records that were reviewed today include:   Cath 01/27/2015 ANGIOGRAPHY:  Left main: Short normal vessel which immediately bifurcated into an LAD and left circumflex vessel.  LAD: Moderate size vessel that gave rise to a proximal diagonal vessel and several septal perforating arteries. The vessel was free of significant disease.  Left circumflex: moderate size vessel that gave rise to small first marginal branch at a percent mid stenosis in the small branch. The circumflex beyond this had diffuse 90-95% ulcerated plaque in its mid segment. Distally there was diffuse 80% stenosis in the region of the small branch takeoff.   Right coronary artery: Large caliber vessel which had mild luminal irregularity of 20% in its mid segment. The vessel supplies a large PDA vessel was otherwise free of significant disease.  Left ventriculography revealed mild acute LV dysfunction with an ejection fraction of 45-50%. There was mild focal mid inferior and focal anterolateral hypocontractility concordant with left circumflex disease.  Following PCI to the left circumflex vessel with PTCA/DES stenting and insertion  of a 3.023 mm Xience alpine DES stent in the mid circumflex postdilated to 3.15  mm the 95% stenosis was reduced to 0%. The distal 80% stenosis was reduced to 0% with insertion of a 2.518 mm Xience alpine DES stent dilated 2.57 mm.   IMPRESSION:  Acute high risk ST segment elevation inferolateral myocardial infarction secondary to circumflex occlusion with development of idioventricular rhythm with probable spontaneous reperfusion en route by [facility].  Predominent single vessel coronary artery disease with 90-95% ulcerated plaque in the mid AV groove circumflex with distal 80% circumflex stenosis, and 80% stenosis in a very small OM1 vessel; normal LAD; mild 20% irregularity in the mid RCA.  Temporary pacemaker for slow idioventricular rhythm.  Successful acute percutaneous coronary intervention left circumflex vessel with the 90-95% stenosis being reduced to 0% after insertion of a Xience Alpine 3.023 mm DES stent postdilated 3.15 mm, and the distal circumflex 80% stenosis been reduced to 0% treated with 2.518 mm Xience Alpine DES stent dilated to 2.57 mm.  RECOMMENDATION:  Patient will continue with antiplatelet therapy for minimum of 1 year but probably longer with her diabetes mellitus. Aggressive lipid-lowering therapy, blood pressure control, be necessary. She will be hydrated postprocedure with underlying renal insufficiency.     Renal US 04/19/2017 IMPRESSION: A questionable 2.7 x 2.5 x 2.7 cm solid hypoechoic mass in the midportion the right kidney. Renal MRI suggested for further evaluation .   ASSESSMENT:    1. Chronic diastolic heart failure ([place])   2. [name] ([place])   3. Right kidney mass   4. Acute renal insufficiency   5. Coronary artery disease involving native coronary artery of native heart without angina pectoris   6. Essential hypertension   7. Hyperlipidemia, unspecified hyperlipidemia type   8. Controlled type 2 diabetes mellitus  without complication, with long-term current use of insulin (H[facility])      PLAN:  In order of problems listed above:  1. Chronic diastolic heart failure: She is currently on as needed dose of Lasix. Despite bibasilar crackles on physical exam today, she has no lower extremity edema, she denies any shortness of breath. I am not entirely sure if bibasilar crackle represent fluid versus atelectasis, however given lack of symptom, I recommend observation for now. She is aware that he she began to have more cough and shortness of breath, she will need to call cardiology service and take additional dose of Lasix. However given her recent acute kidney injury, I'm hesitant to use Lasix as a scheduled regimen.  2. [name]: Recent white blood cell count 29,000, upcoming visit with Dr. [name]  3. Right kidney mass: Initially seen on ultrasound of the kidney in [facility] measuring 1.7 cm, repeat ultrasound a few days later here showed 2.7 cm mass. Pending upcoming MRI  4. Acute kidney injury: It appears she had very poor oral intake and had viral-like symptoms for the entire week prior to recent hospitalization. Although she was placed on as needed dose of Lasix in May, she in fact only taken the Lasix once every 2 weeks therefore it is unlikely the cause for her acute kidney injury.  5. CAD: No obvious recent angina.  6. Hypertension: Blood pressure stable  7. Hyperlipidemia: On Crestor  8. DM 2: On insulin. Recent dose adjustment at outside hospital. Further management per primary care provider.    Medication Adjustments/Labs and Tests Ordered: Current medicines are reviewed at length with the patient today.  Concerns regarding medicines are outlined above.  Medication changes, Labs and Tests ordered today are listed in the Patient  Instructions below. Patient Instructions  Your physician recommends that you continue on your current medications as directed. Please refer to  the Current Medication list given to you today.  If you have increasing shortness of breath or cough - please call our office.   Isaac Laud, [place] wants you to follow-up in: 2-3 months with Dr. [name], [place], [place] or [place], [place].     Hilbert Corrigan, [place]  04/23/2017 10:50 AM    [address], [place], [place]  [address] Phone: [phone]; Fax: [phone]

## 2017-04-22 NOTE — Patient Instructions (Addendum)
Your physician recommends that you continue on your current medications as directed. Please refer to the Current Medication list given to you today.  If you have increasing shortness of breath or cough - please call our office.   Isaac Laud, [place] wants you to follow-up in: 2-3 months with Dr. [name], [place], [place] or [place], [place].

## 2017-04-22 NOTE — Telephone Encounter (Signed)
I did not see this on the current med list. Will wait for Dr. Cruzita Lederer to return.

## 2017-04-25 ENCOUNTER — Other Ambulatory Visit: Payer: Self-pay | Admitting: Internal Medicine

## 2017-04-25 ENCOUNTER — Telehealth: Payer: Self-pay | Admitting: Family

## 2017-04-25 MED ORDER — FREESTYLE LIBRE SENSOR SYSTEM MISC: 1.0000 | 11 refills | Status: DC

## 2017-04-25 MED ORDER — FREESTYLE LIBRE READER DEVI: 1.0000 | Freq: Three times a day (TID) | 1 refills | Status: DC

## 2017-04-25 MED ORDER — LORAZEPAM 0.5 MG PO TABS: ORAL_TABLET | ORAL | 0 refills | Status: DC

## 2017-04-25 NOTE — Telephone Encounter (Signed)
See rx. 

## 2017-04-25 NOTE — Telephone Encounter (Signed)
Noted  

## 2017-04-25 NOTE — Telephone Encounter (Signed)
Done

## 2017-04-25 NOTE — Telephone Encounter (Signed)
Please advise. Thank you

## 2017-04-25 NOTE — Telephone Encounter (Signed)
Rx faxed to pharmacy, notified pt.

## 2017-04-25 NOTE — Telephone Encounter (Signed)
Please see below.  Thank you.

## 2017-04-25 NOTE — Telephone Encounter (Signed)
Relation to VQ:OHCO Call back number:[phone] Pharmacy: [facility] [place], [place] [phone] (Phone) [phone] (Fax     Reason for call:   Patient scheduled MRI for Saturday 04/30/17  requesting anxiety medication prior to appointment, please advise

## 2017-04-27 ENCOUNTER — Ambulatory Visit (HOSPITAL_BASED_OUTPATIENT_CLINIC_OR_DEPARTMENT_OTHER): Payer: Medicare Other | Admitting: Hematology & Oncology

## 2017-04-27 ENCOUNTER — Other Ambulatory Visit (HOSPITAL_BASED_OUTPATIENT_CLINIC_OR_DEPARTMENT_OTHER): Payer: Medicare Other

## 2017-04-27 VITALS — BP 151/56 | HR 89 | Temp 98.3°F | Resp 20 | Wt 217.0 lb

## 2017-04-27 DIAGNOSIS — D45 Polycythemia vera: Secondary | ICD-10-CM

## 2017-04-27 LAB — CBC WITH DIFFERENTIAL (CANCER CENTER ONLY)
BASO#: 0.3 10*3/uL — ABNORMAL HIGH (ref 0.0–0.2)
BASO%: 1.2 % (ref 0.0–2.0)
EOS ABS: 0.5 10*3/uL (ref 0.0–0.5)
EOS%: 2 % (ref 0.0–7.0)
HCT: 31.1 % — ABNORMAL LOW (ref 34.8–46.6)
HGB: 10.5 g/dL — ABNORMAL LOW (ref 11.6–15.9)
LYMPH#: 1.5 10*3/uL (ref 0.9–3.3)
LYMPH%: 6.5 % — AB (ref 14.0–48.0)
MCH: 30.5 pg (ref 26.0–34.0)
MCHC: 33.8 g/dL (ref 32.0–36.0)
MCV: 90 fL (ref 81–101)
MONO#: 1.5 10*3/uL — AB (ref 0.1–0.9)
MONO%: 6.5 % (ref 0.0–13.0)
NEUT#: 19.3 10*3/uL — ABNORMAL HIGH (ref 1.5–6.5)
NEUT%: 83.8 % — AB (ref 39.6–80.0)
RBC: 3.44 10*6/uL — ABNORMAL LOW (ref 3.70–5.32)
RDW: 15.5 % (ref 11.1–15.7)
WBC: 23.1 10*3/uL — ABNORMAL HIGH (ref 3.9–10.0)

## 2017-04-27 LAB — CMP (CANCER CENTER ONLY)
ALT(SGPT): 22 U/L (ref 10–47)
AST: 31 U/L (ref 11–38)
Albumin: 3.5 g/dL (ref 3.3–5.5)
Alkaline Phosphatase: 113 U/L — ABNORMAL HIGH (ref 26–84)
BUN: 10 mg/dL (ref 7–22)
CHLORIDE: 103 meq/L (ref 98–108)
CO2: 24 mEq/L (ref 18–33)
Calcium: 9.2 mg/dL (ref 8.0–10.3)
Creat: 0.8 mg/dl (ref 0.6–1.2)
GLUCOSE: 379 mg/dL — AB (ref 73–118)
POTASSIUM: 4 meq/L (ref 3.3–4.7)
Sodium: 135 mEq/L (ref 128–145)
Total Bilirubin: 1 mg/dl (ref 0.20–1.60)
Total Protein: 6.6 g/dL (ref 6.4–8.1)

## 2017-04-27 LAB — FERRITIN: Ferritin: 516 ng/ml — ABNORMAL HIGH (ref 9–269)

## 2017-04-27 LAB — TECHNOLOGIST REVIEW CHCC SATELLITE

## 2017-04-27 LAB — IRON AND TIBC
%SAT: 26 % (ref 21–57)
Iron: 56 ug/dL (ref 41–142)
TIBC: 215 ug/dL — ABNORMAL LOW (ref 236–444)
UIBC: 159 ug/dL (ref 120–384)

## 2017-04-27 NOTE — Progress Notes (Signed)
Hematology and Oncology Follow Up Visit  Amanda [name] [id number] 1941/05/26 76 y.o. 04/27/2017   Principle Diagnosis:  [name]-JAK2 (+)  Current Therapy:   Jakafi 37m po BID    Interim History:  Amanda [name] here today with her husband for follow-up.  Amanda [name], she talks about the nightmare that [place] in [place] She is up there for a nephew's high school graduation. Somehow, she got sick. I'm still not clear as to what exactly happened. She was in the hospital for about 10 days. She was told she had heart failure. She was told she had kidney cancer. She apparently had an ultrasound of the right kidney. She had renal failure. This showed a tumor in the right kidney. She was told that this was cancer.  She had an ultrasound done today. This showed a 2.7 x 2.5 x 2.7 cm solid mass in the midportion of the right kidney. She is due for a MRI this weekend.  Her blood sugars have was been a problem here. Apparently, [place] she had all of her medications readjusted.  I'm still not exactly sure as to what was found up there. It did not so if she had any infection.  She's taken off her JAKAFI. It sounds like she may have been put on Hydrea. She restarted the J[place]FI [place]  She sees her diabetic doctor in a week or so.  She looks pretty good to me. She may have lost a libido weight.  Currently, her performance status is ECOG 2.   Medications:  Allergies as of 04/27/2017      Reactions   Lisinopril Cough   Tape Hives   Doxycycline Hives, Swelling, Rash   Latex Hives, Itching, Rash      Medication List       Accurate as of 04/27/17  5:56 PM. Always use your most recent med list.          amLODipine 5 MG tablet Commonly known as:  NORVASC Take 1 tablet (5 mg total) by mouth daily.   aspirin EC 81 MG tablet Take 81 mg by mouth daily.   diphenoxylate-atropine 2.5-0.025 MG tablet Commonly known as:  LOMOTIL Take 1-2  tablets by mouth 2 (two) times daily as needed for diarrhea or loose stools.   FREESTYLE LIBRE READER Devi 1 Device by Does not apply route 3 (three) times daily.   FREESTYLE LIBRE SENSOR SYSTEM Misc 1 Device by Does not apply route every 30 (thirty) days.   furosemide 20 MG tablet Commonly known as:  LASIX Take 1 tablet (20 mg total) by mouth daily. As needed for Edema   Insulin Pen Needle 32G X 4 MM Misc Use to inject insulins.   insulin regular human CONCENTRATED 500 UNIT/ML kwikpen Commonly known as:  HUMULIN R U-500 KWIKPEN Inject 100 units under the skin before breakfast and 85 units before dinner.   loperamide 2 MG tablet Commonly known as:  IMODIUM A-D Take 1-2 tablets (2-4 mg total) by mouth daily as needed for diarrhea or loose stools.   LORazepam 0.5 MG tablet Commonly known as:  ATIVAN 1 tablet by mouth 30 minutes before MRI. May repeat x 1 if needed   losartan 100 MG tablet Commonly known as:  COZAAR TAKE 1 TABLET BY MOUTH ONCE DAILY   nitroGLYCERIN 0.4 MG SL tablet Commonly known as:  NITROSTAT Place 1 tablet (0.4 mg total) under the tongue every 5 (five)  minutes as needed for chest pain.   ONETOUCH VERIO test strip Generic drug:  glucose blood Use to test blood sugar 3 times daily.   Pancrelipase (Lip-Prot-Amyl) 24000-76000 units Cpep Take 1 capsule (24,000 Units total) by mouth 3 (three) times daily before meals.   promethazine 25 MG tablet Commonly known as:  PHENERGAN Take 0.5 tablets (12.5 mg total) by mouth every 6 (six) hours as needed for nausea or vomiting.   rosuvastatin 20 MG tablet Commonly known as:  CRESTOR TAKE 1 TABLET(20 MG) BY MOUTH DAILY   ruxolitinib phosphate 25 MG tablet Commonly known as:  JAKAFI Take 1 tablet (25 mg total) by mouth 2 (two) times daily.       Allergies:  Allergies  Allergen Reactions  . Lisinopril Cough  . Tape Hives  . Doxycycline Hives, Swelling and Rash  . Latex Hives, Itching and Rash    Past  Medical History, Surgical history, Social history, and Family History were reviewed and updated.  Review of Systems: All other 10 point review of systems is negative.   Physical Exam:  weight is 217 lb (98.4 kg). Her oral temperature is 98.3 F (36.8 C). Her blood pressure is 151/56 (abnormal) and her pulse is 89. Her respiration is 20 and oxygen saturation is 93%.   Wt Readings from Last 3 Encounters:  04/27/17 217 lb (98.4 kg)  04/22/17 218 lb (98.9 kg)  04/19/17 217 lb (98.4 kg)    Obese white female in no obvious distress. Head and neck exam shows no ocular or oral lesions. There is no scleral icterus. She has no intraoral lesions. There is no mucositis. She has no adenopathy in the neck. Thyroid is nonpalpable. Her lungs are relatively clear bilaterally. Cardiac exam regular rate and rhythm with no murmurs, rubs or bruits. Abdomen is soft. She has decent bowel sounds. She is obese. There is no guarding or rebound tenderness. She has no fluid wave. There is no palpable liver or spleen tip. Extremities shows some trace edema in her lower legs. She has some osteophytic changes in her joints. Skin exam shows no rashes, ecchymoses or petechia. Neurological exam shows no focal neurological deficits.  Lab Results  Component Value Date   WBC 23.1 (H) 04/27/2017   HGB 10.5 (L) 04/27/2017   HCT 31.1 (L) 04/27/2017   MCV 90 04/27/2017   PLT 459 Few Large platelets present (H) 04/27/2017   Lab Results  Component Value Date   FERRITIN 516 (H) 04/27/2017   IRON 56 04/27/2017   TIBC 215 (L) 04/27/2017   UIBC 159 04/27/2017   IRONPCTSAT 26 04/27/2017   Lab Results  Component Value Date   RETICCTPCT 2.4 01/30/2014   RBC 3.44 (L) 04/27/2017   No results found for: KPAFRELGTCHN, LAMBDASER, [facility] Lab Results  Component Value Date   IGA 233 03/29/2014   No results found for: Odetta Pink, SPEI   Chemistry      Component Value  Date/Time   NA 135 04/27/2017 0906   NA 138 01/21/2017 0806   K 4.0 04/27/2017 0906   K 4.1 01/21/2017 0806   CL 103 04/27/2017 0906   CL 103 02/16/2013 0900   CO2 24 04/27/2017 0906   CO2 21 (L) 01/21/2017 0806   BUN 10 04/27/2017 0906   BUN 12.2 01/21/2017 0806   CREATININE 0.8 04/27/2017 0906   CREATININE 1.0 01/21/2017 0806      Component Value Date/Time   CALCIUM 9.2 04/27/2017 0906  CALCIUM 9.3 01/21/2017 0806   ALKPHOS 113 (H) 04/27/2017 0906   ALKPHOS 112 01/21/2017 0806   AST 31 04/27/2017 0906   AST 37 (H) 01/21/2017 0806   ALT 22 04/27/2017 0906   ALT 16 01/21/2017 0806   BILITOT 1.00 04/27/2017 0906   BILITOT 0.89 01/21/2017 0806      Impression and Plan: Ms. Hudson is a 76 yo white female with polycythemia vera, JAK2 positive.   It is still unclear as to what happened up in [place].  The issue right now is this right renal tumor. We are going to have to see what the MRI shows.  From my point of view, she is not an operative candidate. I think that at this is suspicious for a renal cell carcinoma, that we need to consider having interventional radiology do a percutaneous procedure either RFA or cryoablation.  As always, her blood sugars are quite high.  I spent about 40 minutes with she and her husband. As always, it is not unusual that she has an issue when she sees [place].   I would like to see her back in about 3 weeks. At that point, we probably will have to deal with the renal lesion.   Volanda Napoleon, MD 7/11/20185:56 PM

## 2017-04-30 ENCOUNTER — Ambulatory Visit (HOSPITAL_BASED_OUTPATIENT_CLINIC_OR_DEPARTMENT_OTHER)
Admission: RE | Admit: 2017-04-30 | Discharge: 2017-04-30 | Disposition: A | Discharge status: Home / Self Care | Payer: Medicare Other | Source: Ambulatory Visit | Attending: Family | Admitting: Family

## 2017-04-30 DIAGNOSIS — R161 Splenomegaly, not elsewhere classified: Secondary | ICD-10-CM | POA: Insufficient documentation

## 2017-04-30 DIAGNOSIS — K760 Fatty (change of) liver, not elsewhere classified: Secondary | ICD-10-CM | POA: Insufficient documentation

## 2017-04-30 DIAGNOSIS — N2889 Other specified disorders of kidney and ureter: Secondary | ICD-10-CM | POA: Diagnosis present

## 2017-04-30 DIAGNOSIS — N281 Cyst of kidney, acquired: Secondary | ICD-10-CM | POA: Diagnosis not present

## 2017-04-30 MED ORDER — GADOBENATE DIMEGLUMINE 529 MG/ML IV SOLN
10.0000 mL | Freq: Once | INTRAVENOUS | Status: AC | PRN
  Administered 2017-04-30: 10 mL via INTRAVENOUS

## 2017-05-04 ENCOUNTER — Telehealth: Payer: Self-pay

## 2017-05-04 NOTE — Telephone Encounter (Signed)
Called patient and advised that the Freestyle Elenor Legato was not covered under insurance. Patient understood, and will discuss options at visit on Friday. No other questions at this time.

## 2017-05-06 ENCOUNTER — Encounter: Payer: Self-pay | Admitting: Internal Medicine

## 2017-05-06 ENCOUNTER — Ambulatory Visit (INDEPENDENT_AMBULATORY_CARE_PROVIDER_SITE_OTHER): Payer: Medicare Other | Admitting: Internal Medicine

## 2017-05-06 VITALS — BP 140/82 | HR 87 | Wt 219.0 lb

## 2017-05-06 DIAGNOSIS — Z794 Long term (current) use of insulin: Secondary | ICD-10-CM | POA: Diagnosis not present

## 2017-05-06 DIAGNOSIS — IMO0002 Reserved for concepts with insufficient information to code with codable children: Secondary | ICD-10-CM

## 2017-05-06 DIAGNOSIS — E1165 Type 2 diabetes mellitus with hyperglycemia: Secondary | ICD-10-CM | POA: Diagnosis not present

## 2017-05-06 DIAGNOSIS — E1142 Type 2 diabetes mellitus with diabetic polyneuropathy: Secondary | ICD-10-CM | POA: Diagnosis not present

## 2017-05-06 DIAGNOSIS — E1149 Type 2 diabetes mellitus with other diabetic neurological complication: Secondary | ICD-10-CM | POA: Diagnosis not present

## 2017-05-06 MED ORDER — INSULIN REGULAR HUMAN (CONC) 500 UNIT/ML ~~LOC~~ SOPN: PEN_INJECTOR | SUBCUTANEOUS | 1 refills | Status: DC

## 2017-05-06 MED ORDER — CANAGLIFLOZIN 100 MG PO TABS: 100.0000 mg | ORAL_TABLET | Freq: Every day | ORAL | 5 refills | Status: DC

## 2017-05-06 NOTE — Progress Notes (Addendum)
Patient ID: Amanda [name], female   DOB: 29-May-1941, 76 y.o.   MRN: [id number]  HPI: Amanda [name] is a 76 y.o.-year-old female, returning for f/u for DM2, dx 2008, insulin-dependent since dx, uncontrolled, with complications (DR, PN, CAD). Last visit 6 mo ago. Her husband accompanies her today and offers part of the history.  She has PVera (managed by Dr. [name]).  She was hospitalized in [place] for bronchitis x 8 days (06/14-07/01), but other diagnoses were entertained. Her sugars were uncontrolled in the hospital and since the hospitalization.  Last hemoglobin A1c was: 02/11/2016: HbA1c from  fructosamine is 7.5% Lab Results  Component Value Date   HGBA1C 9.6 10/28/2015   HGBA1C 8.8 (H) 01/27/2015   HGBA1C 9.8 (H) 05/16/2014  07/01/2015: HbA1c from fructosamine 6.57%. 01/27/2015: HbA1c from fructosamine: 6.57% 05/16/2014: HbA1c from fructosamine: 7.95%. - in 10/16/2012, hemoglobin A1c was 6.9% - in 07/2012, hemoglobin A1c was 9.9% - in 11/16/2010, hemoglobin A1c was 12.8% - in 03/2011, hemoglobin A1c was 6.9% - In 07/10/2010 her hemoglobin A1c was 10.5% C-peptide was 6.3 and 01/2011.  On U500 pens: - 100 units before b'fast (may be 7 am to 11 am)  >> 85 units in am - 85 units before dinner >> 100 units before dinner - - added 10/2016 >> not using Stopped Invokana 100 mg 2 mo ago and husband clarifies that this was b/c of negative publicity.  She could not tolerate doses of metformin XR higher than 250 mg twice a day in the past due to diarrhea. She does not want to resume this. Was previously on the Victoza 1.8 mg daily - "made me sick". She was previously on Janumet between 2012-2013. She was on Invokana - but could not afford it while in the Medicare doughnut hole.  She checks sugars 2x a day: - am: 118-227, 276, 478 >> 110-221 >> 70s, 120-150, 200s with Christmas >> 200-250, 365 - 2h after b'fast: n/c >> 238 >> n/c >> 167 >> n/c - before lunch: 128-235, 418, 499 >> 69,  90-198, 250 >> n/c - 2h after lunch:  108-447 >> 171-175 >> 130-150s, 280 (Thanksgiving) >> n/c - before dinner:  150-160 >> 162-386, 592 >> 75-192 >> 120-140s >> 170-210 - bedtime:  164, 240, 240, 268 >> 273 >> n/c >> 180-220 >> n/c No lows. Lowest sugar was 69x1>> 70s >> 23 in the hospital ? What regimen she was on then); she has hypoglycemia awareness at 100.  Highest 400s when sick >> 280 >> 365.  She was seen for nutritional advice and diabetes education, with last visit on 04/2012 - [name].  - no CKD, last BUN/creatinine:  Lab Results  Component Value Date   BUN 10 04/27/2017   CREATININE 0.8 04/27/2017  She is on ACEI.   Latest Lipid panel: Lab Results  Component Value Date   CHOL 207 (H) 02/14/2017   HDL 34.00 (L) 02/14/2017   LDLCALC 88 01/27/2015   LDLDIRECT 103.0 02/14/2017   TRIG 243.0 (H) 02/14/2017   CHOLHDL 6 02/14/2017  She is on Pravastatin. - last eye exam was in 07/2013. 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 She has an appointment scheduled. - She also has peripheral neuropathy >> saw PCP, podiatrist, orthopedist >> tried capsaicin cream, compression stockings, then Neurontin.  She is on aspirin 81 mg daily. Off Brillinta now.  She also has a history of hypertension, diastolic dysfunction, hyperlipidemia, polycytemia vera, gout, psoriasis, depression, history of Bell's palsy,  obesity, vitamin D deficiency.  On 01/27/2015 she had an Ahad 2 stents placed.  ROS: Constitutional: no weight gain/no weight loss, no fatigue, no subjective hyperthermia, no subjective hypothermia Eyes: no blurry vision, no xerophthalmia ENT: no sore throat, no nodules palpated in throat, no dysphagia, no odynophagia, no hoarseness Cardiovascular: no CP/no SOB/no palpitations/no leg swelling Respiratory: + cough/no SOB/no wheezing Gastrointestinal: no N/no V/no D/no C/no acid reflux Musculoskeletal: no muscle aches/no joint aches Skin: no  rashes, no hair loss Neurological: no tremors/no numbness/no tingling/no dizziness  I reviewed pt's medications, allergies, PMH, social hx, family hx, and changes were documented in the history of present illness. Otherwise, unchanged from my initial visit note.  PE: BP 140/82 (BP Location: Left Arm, Patient Position: Sitting)   Pulse 87   Wt 219 lb (99.3 kg)   SpO2 94%   BMI 37.01 kg/m  Body mass index is 37.01 kg/m. Wt Readings from Last 3 Encounters:  05/06/17 219 lb (99.3 kg)  04/27/17 217 lb (98.4 kg)  04/22/17 218 lb (98.9 kg)   Constitutional: overweight, in NAD Eyes: PERRLA, EOMI, no exophthalmos ENT: moist mucous membranes, no thyromegaly, no cervical lymphadenopathy Cardiovascular: RRR, No MRG Respiratory: + Diffuse crackles at bilateral lung fields  Gastrointestinal: abdomen soft, NT, ND, BS+ Musculoskeletal: no deformities, strength intact in all 4 Skin: moist, warm, no rashes Neurological: no tremor with outstretched hands, DTR normal in all 4  ASSESSMENT: 1. 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 Diabetic peripheral neuropathy  PLAN:  1. Patient with long-standing, uncontrolled diabetes, on U500 insulin. Her diabetes is worse controlled after her hospitalization with most sugars in the 200s. I discussed with the patient about adding back iNVOKANA and she her husband agree. We discussed about the new studies that show only benefits from this medication. Discussed about possible side effects. We'll need a new BMP at next visit. - Will also add 5 units of insulin if she has a snack at night, since I suspect that her high sugars in the morning may be related to these.  - I advised her to: Patient Instructions  Please continue U500 insulin: - 85 units in am - 100 units before dinner  Please add back: - Invokana 100 mg daily before b'fast  If sugars in am are higher if you have a snack at  night, then add 5 units of insulin before the snack.  Please return in 3 months with your sugar log.   - Check sugars twice a day, rotating check times  -  and advised her to have her eye Dr. send me the record of her new eye exam - We'll check another fructosamine level. HbA1c is not accurate for her due to the PE vera and should not be checked. - I will see her back in 3 months   2. Diabetic peripheral neuropathy - continues on Neurontin 600 mg 2 times a day - she is alsoon neuropathy cream: mix Amitriptyline 2%, Lidocaine 2%, and Ketamine 1% - she takes this as needed - Stable   Office Visit on 05/06/2017  Component Date Value Ref Range Status  . Fructosamine 05/06/2017 302* 190 - 270 umol/L Final   HbA1c calculated from the fructosamine appears better, at 6.7%.  [name], MD PhD [facility] Endocrinology

## 2017-05-06 NOTE — Patient Instructions (Signed)
Please continue U500 insulin: - 85 units in am - 100 units before dinner  Please add back: - Invokana 100 mg daily before b'fast  If sugars in am are higher if you have a snack at night, then add 5 units of insulin before the snack.  Please return in 3 months with your sugar log.

## 2017-05-09 DIAGNOSIS — H40013 Open angle with borderline findings, low risk, bilateral: Secondary | ICD-10-CM | POA: Diagnosis not present

## 2017-05-09 DIAGNOSIS — H524 Presbyopia: Secondary | ICD-10-CM | POA: Diagnosis not present

## 2017-05-09 DIAGNOSIS — E113292 Type 2 diabetes mellitus with mild nonproliferative diabetic retinopathy without macular edema, left eye: Secondary | ICD-10-CM | POA: Diagnosis not present

## 2017-05-09 DIAGNOSIS — H2513 Age-related nuclear cataract, bilateral: Secondary | ICD-10-CM | POA: Diagnosis not present

## 2017-05-09 LAB — FRUCTOSAMINE: FRUCTOSAMINE: 302 umol/L — AB (ref 190–270)

## 2017-05-09 LAB — HM DIABETES EYE EXAM

## 2017-05-17 ENCOUNTER — Other Ambulatory Visit (HOSPITAL_BASED_OUTPATIENT_CLINIC_OR_DEPARTMENT_OTHER): Payer: Medicare Other

## 2017-05-17 ENCOUNTER — Ambulatory Visit (HOSPITAL_BASED_OUTPATIENT_CLINIC_OR_DEPARTMENT_OTHER): Payer: Medicare Other | Admitting: Family

## 2017-05-17 VITALS — BP 160/59 | HR 89 | Temp 98.4°F | Resp 22 | Wt 219.0 lb

## 2017-05-17 DIAGNOSIS — D45 Polycythemia vera: Secondary | ICD-10-CM

## 2017-05-17 DIAGNOSIS — D508 Other iron deficiency anemias: Secondary | ICD-10-CM | POA: Diagnosis not present

## 2017-05-17 DIAGNOSIS — D509 Iron deficiency anemia, unspecified: Secondary | ICD-10-CM | POA: Diagnosis not present

## 2017-05-17 LAB — MANUAL DIFFERENTIAL (CHCC SATELLITE)
ALC: 4.6 10*3/uL — AB (ref 0.6–2.2)
ANC (CHCC MAN DIFF): 24.2 10*3/uL — AB (ref 1.5–6.7)
BASO: 1 % (ref 0–2)
Band Neutrophils: 7 % (ref 0–10)
Eos: 1 % (ref 0–7)
LYMPH: 15 % (ref 14–48)
MONO: 4 % (ref 0–13)
Metamyelocytes: 2 % — ABNORMAL HIGH (ref 0–0)
PLT EST ~~LOC~~: INCREASED
SEG: 70 % (ref 40–75)

## 2017-05-17 LAB — CMP (CANCER CENTER ONLY)
ALT(SGPT): 25 U/L (ref 10–47)
AST: 50 U/L — ABNORMAL HIGH (ref 11–38)
Albumin: 3.4 g/dL (ref 3.3–5.5)
Alkaline Phosphatase: 100 U/L — ABNORMAL HIGH (ref 26–84)
BUN: 16 mg/dL (ref 7–22)
CHLORIDE: 107 meq/L (ref 98–108)
CO2: 25 meq/L (ref 18–33)
CREATININE: 1.1 mg/dL (ref 0.6–1.2)
Calcium: 9.3 mg/dL (ref 8.0–10.3)
GLUCOSE: 304 mg/dL — AB (ref 73–118)
Potassium: 3.8 mEq/L (ref 3.3–4.7)
SODIUM: 137 meq/L (ref 128–145)
Total Bilirubin: 0.9 mg/dl (ref 0.20–1.60)
Total Protein: 6.6 g/dL (ref 6.4–8.1)

## 2017-05-17 LAB — IRON AND TIBC
%SAT: 37 % (ref 21–57)
Iron: 97 ug/dL (ref 41–142)
TIBC: 261 ug/dL (ref 236–444)
UIBC: 164 ug/dL (ref 120–384)

## 2017-05-17 LAB — CBC WITH DIFFERENTIAL (CANCER CENTER ONLY)
HCT: 35.3 % (ref 34.8–46.6)
HGB: 11.7 g/dL (ref 11.6–15.9)
MCH: 31.1 pg (ref 26.0–34.0)
MCHC: 33.1 g/dL (ref 32.0–36.0)
MCV: 94 fL (ref 81–101)
PLATELETS: 612 10*3/uL — AB (ref 145–400)
RBC: 3.76 10*6/uL (ref 3.70–5.32)
RDW: 16.6 % — AB (ref 11.1–15.7)
WBC: 30.7 10*3/uL — ABNORMAL HIGH (ref 3.9–10.0)

## 2017-05-17 LAB — FERRITIN: FERRITIN: 407 ng/mL — AB (ref 9–269)

## 2017-05-17 LAB — LACTATE DEHYDROGENASE: LDH: 332 U/L — ABNORMAL HIGH (ref 125–245)

## 2017-05-17 NOTE — Progress Notes (Signed)
Hematology and Oncology Follow Up Visit  Amanda [name] [id number] 1941/09/24 76 y.o. 05/17/2017   Principle Diagnosis:  [name]-JAK2 (+)  Current Therapy:   Jakafi 5m PO BID   Interim History:  Amanda [name] here today with her husband for follow-up. She is doing a bit better and feels that her energy is improving. She has started eating healthier and watching her portions. She is really trying to lower her blood sugars. She has restarted her Invokana.  She is staying well hydrated and her weight is stable.  She verbalized that she is taking her Jakafi 25 mg PO BID as prescribed.  Her WBC count is still elevated at 30.7. Hgb is stable at 11.7 and platelet count is up at 612. Her uncontrolled diabetes appears to be her biggest issue at this time. Glucose is 304 and she took her insulin while she was here in the office.  No fever, chills, n/v, cough, rash, dizziness, SOB, chest pain, palpitations, abdominal pain or changes in bowel or bladder habits.  No swelling, tenderness, numbness or tingling in her extremities at this time. No c/o pain.  She ambulates with her cane or walker and thankfully has had no falls. No syncopal episodes.   ECOG Performance Status: 1 - Symptomatic but completely ambulatory  Medications:  Allergies as of 05/17/2017      Reactions   Lisinopril Cough   Tape Hives   Doxycycline Hives, Swelling, Rash   Latex Hives, Itching, Rash      Medication List       Accurate as of 05/17/17 11:50 AM. Always use your most recent med list.          amLODipine 5 MG tablet Commonly known as:  NORVASC Take 1 tablet (5 mg total) by mouth daily.   aspirin EC 81 MG tablet Take 81 mg by mouth daily.   canagliflozin 100 MG Tabs tablet Commonly known as:  INVOKANA Take 1 tablet (100 mg total) by mouth daily.   diphenoxylate-atropine 2.5-0.025 MG tablet Commonly known as:  LOMOTIL Take 1-2 tablets by mouth 2 (two) times daily as needed for diarrhea or  loose stools.   FREESTYLE LIBRE READER Devi 1 Device by Does not apply route 3 (three) times daily.   FREESTYLE LIBRE SENSOR SYSTEM Misc 1 Device by Does not apply route every 30 (thirty) days.   furosemide 20 MG tablet Commonly known as:  LASIX Take 1 tablet (20 mg total) by mouth daily. As needed for Edema   Insulin Pen Needle 32G X 4 MM Misc Use to inject insulins.   insulin regular human CONCENTRATED 500 UNIT/ML kwikpen Commonly known as:  HUMULIN R U-500 KWIKPEN Inject 85 units under the skin before breakfast and 100 units before dinner.   loperamide 2 MG tablet Commonly known as:  IMODIUM A-D Take 1-2 tablets (2-4 mg total) by mouth daily as needed for diarrhea or loose stools.   LORazepam 0.5 MG tablet Commonly known as:  ATIVAN 1 tablet by mouth 30 minutes before MRI. May repeat x 1 if needed   losartan 100 MG tablet Commonly known as:  COZAAR TAKE 1 TABLET BY MOUTH ONCE DAILY   nitroGLYCERIN 0.4 MG SL tablet Commonly known as:  NITROSTAT Place 1 tablet (0.4 mg total) under the tongue every 5 (five) minutes as needed for chest pain.   ONETOUCH VERIO test strip Generic drug:  glucose blood Use to test blood sugar 3 times daily.   Pancrelipase (Lip-Prot-Amyl) 24000-76000 units Cpep  Take 1 capsule (24,000 Units total) by mouth 3 (three) times daily before meals.   promethazine 25 MG tablet Commonly known as:  PHENERGAN Take 0.5 tablets (12.5 mg total) by mouth every 6 (six) hours as needed for nausea or vomiting.   rosuvastatin 20 MG tablet Commonly known as:  CRESTOR TAKE 1 TABLET(20 MG) BY MOUTH DAILY   ruxolitinib phosphate 25 MG tablet Commonly known as:  JAKAFI Take 1 tablet (25 mg total) by mouth 2 (two) times daily.       Allergies:  Allergies  Allergen Reactions  . Lisinopril Cough  . Tape Hives  . Doxycycline Hives, Swelling and Rash  . Latex Hives, Itching and Rash    Past Medical History, Surgical history, Social history, and Family  History were reviewed and updated.  Review of Systems: All other 10 point review of systems is negative.   Physical Exam:  weight is 219 lb (99.3 kg). Her oral temperature is 98.4 F (36.9 C). Her blood pressure is 160/59 (abnormal) and her pulse is 89. Her respiration is 22 (abnormal) and oxygen saturation is 95%.   Wt Readings from Last 3 Encounters:  05/17/17 219 lb (99.3 kg)  05/06/17 219 lb (99.3 kg)  04/27/17 217 lb (98.4 kg)    Ocular: Sclerae unicteric, pupils equal, round and reactive to light Ear-nose-throat: Oropharynx clear, dentition fair Lymphatic: No cervical, supraclavicular or axillary adenopathy Lungs no rales or rhonchi, good excursion bilaterally Heart regular rate and rhythm, no murmur appreciated Abd soft, nontender, positive bowel sounds, no liver or spleen tip palpated on exam, no fluid wave MSK no focal spinal tenderness, no joint edema Neuro: non-focal, well-oriented, appropriate affect Breasts: Deferred   Lab Results  Component Value Date   WBC 30.7 (H) 05/17/2017   HGB 11.7 05/17/2017   HCT 35.3 05/17/2017   MCV 94 05/17/2017   PLT 612 (H) 05/17/2017   Lab Results  Component Value Date   FERRITIN 516 (H) 04/27/2017   IRON 56 04/27/2017   TIBC 215 (L) 04/27/2017   UIBC 159 04/27/2017   IRONPCTSAT 26 04/27/2017   Lab Results  Component Value Date   RETICCTPCT 2.4 01/30/2014   RBC 3.76 05/17/2017   No results found for: Nils Pyle [facility] Lab Results  Component Value Date   IGA 233 03/29/2014   No results found for: Kathrynn Ducking, MSPIKE, SPEI   Chemistry      Component Value Date/Time   NA 137 05/17/2017 1112   NA 138 01/21/2017 0806   K 3.8 05/17/2017 1112   K 4.1 01/21/2017 0806   CL 107 05/17/2017 1112   CL 103 02/16/2013 0900   CO2 25 05/17/2017 1112   CO2 21 (L) 01/21/2017 0806   BUN 16 05/17/2017 1112   BUN 12.2 01/21/2017 0806   CREATININE 1.1 05/17/2017  1112   CREATININE 1.0 01/21/2017 0806      Component Value Date/Time   CALCIUM 9.3 05/17/2017 1112   CALCIUM 9.3 01/21/2017 0806   ALKPHOS 100 (H) 05/17/2017 1112   ALKPHOS 112 01/21/2017 0806   AST 50 (H) 05/17/2017 1112   AST 37 (H) 01/21/2017 0806   ALT 25 05/17/2017 1112   ALT 16 01/21/2017 0806   BILITOT 0.90 05/17/2017 1112   BILITOT 0.89 01/21/2017 0806      Impression and Plan: Ms. Warnick is a very pleasant 76 yo caucasian female with polycythemia vera, JAK2 positive. She is taking her Jakafi BID as prescribed.  She also has intermittent iron deficiency and has required IV iron in the past. We will see what her counts show and bring her back in later this week for infusion if needed.  She will continue to her same dose of Jakafi.  We will plan to see her back in another months for repeat lab work and follow-up.  She will contact or office with any questions or concerns. We can certainly see her sooner if need be.   Eliezer Bottom, NP 7/31/201811:50 AM

## 2017-05-24 ENCOUNTER — Telehealth: Payer: Self-pay | Admitting: Cardiovascular Disease

## 2017-05-24 NOTE — Telephone Encounter (Signed)
New message    [place], [facility] nurse is calling asking if pt has a diagnosis of CHF and a EF percentage and date it the echo was. Please call.

## 2017-05-24 NOTE — Telephone Encounter (Signed)
Left message on Whitney's at [facility] her that patients last echo was 01/29/2016 with a EF of 60-65% and last office visit was 04/22/2017 with Almyra Deforest, PA and patient was diagnosed with chronic diastolic heart failure. Left call back number incase she had any questions to give the office a call back.

## 2017-06-08 ENCOUNTER — Telehealth: Payer: Self-pay | Admitting: Internal Medicine

## 2017-06-08 ENCOUNTER — Telehealth: Payer: Self-pay | Admitting: Family

## 2017-06-08 NOTE — Telephone Encounter (Signed)
Pt sees endo for diabetes and will defer to them for a1c. Faxed most recent lipid panel.

## 2017-06-08 NOTE — Telephone Encounter (Signed)
Whitney from [facility] called and needed to know the last Hemaglobin A1C reading for the patient. Call or fax Whitney at number provided or fax: [phone]

## 2017-06-08 NOTE — Telephone Encounter (Signed)
Caller name: Loree Fee from [employer] back number: [phone]    Reason for call:  RN requesting most recent lipid and Hemoglobin A1C results please fax to [phone].

## 2017-06-09 NOTE — Telephone Encounter (Signed)
Faxed over fructosamine reading.

## 2017-06-14 ENCOUNTER — Ambulatory Visit (INDEPENDENT_AMBULATORY_CARE_PROVIDER_SITE_OTHER): Payer: Medicare Other | Admitting: Family

## 2017-06-14 ENCOUNTER — Encounter: Payer: Self-pay | Admitting: Family

## 2017-06-14 VITALS — BP 148/58 | HR 99 | Temp 98.4°F | Ht 65.0 in | Wt 224.0 lb

## 2017-06-14 DIAGNOSIS — D45 Polycythemia vera: Secondary | ICD-10-CM | POA: Diagnosis not present

## 2017-06-14 DIAGNOSIS — Z794 Long term (current) use of insulin: Secondary | ICD-10-CM | POA: Diagnosis not present

## 2017-06-14 DIAGNOSIS — K7460 Unspecified cirrhosis of liver: Secondary | ICD-10-CM

## 2017-06-14 DIAGNOSIS — E1142 Type 2 diabetes mellitus with diabetic polyneuropathy: Secondary | ICD-10-CM | POA: Diagnosis not present

## 2017-06-14 DIAGNOSIS — N179 Acute kidney failure, unspecified: Secondary | ICD-10-CM | POA: Diagnosis not present

## 2017-06-14 DIAGNOSIS — I2510 Atherosclerotic heart disease of native coronary artery without angina pectoris: Secondary | ICD-10-CM | POA: Diagnosis not present

## 2017-06-14 NOTE — Assessment & Plan Note (Signed)
Clinically stable, management per hematology.

## 2017-06-14 NOTE — Progress Notes (Signed)
Subjective:    Patient ID: Amanda [name], female    DOB: [birth year], 76 y.o.   MRN: [id number]  HPI  Amanda [name] is a 76 year old female who presents today for follow-up.  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 She is maintained on Jakafi.    Lab Results  Component Value Date   WBC 30.7 (H) 05/17/2017   HGB 11.7 05/17/2017   HCT 35.3 05/17/2017   MCV 94 05/17/2017   PLT 612 (H) 05/17/2017   History of coronary artery disease/Chronic diastolic CHF- status post stent placement/STEMI 4/16 with DES x 2.  She is maintained on antiplatelet therapy (aspirin 82m once daily) and is managed by cardiology.  Kidney mass/acute renal failure-[facility] 1.7 cm indeterminate mass in the right kidney. She subsequently underwent an MRI of the abdomen on 04/30/2017. Thankfully MRI noted no suspicious renal lesions. Note was made of morphologic changes in the liver compatible with underlying cirrhosis. Hepatic steatosis also noted. Lab Results  Component Value Date   CREATININE 1.1 05/17/2017   She apparently had a creatinine as high as 4 [place] creatinine is normal.  Diabetes type 2 uncontrolled-she is managed by endocrinology. Last A1c from fructosamine was 7.5%.  Review of Systems See HPI  Past Medical History:  Diagnosis Date  . Allergic rhinitis 01/23/2016  . C. difficile colitis   . CAD (coronary artery disease)    a. s/p STEMI in 01/2015 with 95% LCx stenosis and distal 80% LCx stenosis (DESx2 placed)  . [place]    SKIN  . Candidiasis of skin 09/30/2014  . [place]   . Depression   . Diabetes mellitus 2008  . Gout   . Herpes simplex   . Hyperglycemia 05/31/2013  . Hyperlipidemia   . Hypertension   . [place]ocardial infarction ([place]   . Neuromuscular disorder ([place]    [place]  . Obesity   . Polycythemia    Dr. WElease Hashimoto [facility]  hematology  . Psoriasis      Social History   Social History  . Marital status: Married    Spouse name: N/A  . Number of children: 2  . Years of education: 2 yr colle   Occupational History  . housewife    Social History Main Topics  . Smoking status: Former Smoker    Years: 48.00    Types: Cigarettes    Quit date: 10/18/1986  . Smokeless tobacco: Never Used  . Alcohol use No  . Drug use: No  . Sexual activity: Yes    Partners: Male    Birth control/ protection: None   Other Topics Concern  . Not on file   Social History Narrative   2 caffeine drink per day.  No regular exercise.     Retired from the bank   2 children (Daughter and a son) son has morbid obesity   2 grandchildren   3 great grandchildren    Past Surgical History:  Procedure Laterality Date  . BREAST SURGERY Left    milk duct  . COLONOSCOPY    . LEFT HEART CATHETERIZATION WITH CORONARY ANGIOGRAM N/A 01/27/2015   Procedure: LEFT HEART CATHETERIZATION WITH CORONARY ANGIOGRAM;  Surgeon: TTroy Sine MD;  [facility];  Service: Cardiovascular;  Laterality: N/A;  . TONSILLECTOMY    . TOTAL ABDOMINAL HYSTERECTOMY W/ BILATERAL SALPINGOOPHORECTOMY     for heavy periods with appendectomy  Family History  Problem Relation Age of Onset  . Diabetes Mother   . Heart disease Mother        CAD  . Hyperlipidemia Mother   . Hypertension Mother   . Kidney disease Mother   . Kidney disease Father   . Breast cancer Maternal Aunt   . Heart disease Maternal Grandmother   . Heart disease Other        maternal aunts and uncles    Allergies  Allergen Reactions  . Lisinopril Cough  . Tape Hives  . Doxycycline Hives, Swelling and Rash  . Latex Hives, Itching and Rash    Current Outpatient Prescriptions on File Prior to Visit  Medication Sig Dispense Refill  . amLODipine (NORVASC) 5 MG tablet Take 1 tablet (5 mg total) by mouth daily. 30 tablet 1  . aspirin EC 81 MG tablet Take 81 mg by mouth  daily.    . canagliflozin (INVOKANA) 100 MG TABS tablet Take 1 tablet (100 mg total) by mouth daily. 30 tablet 5  . Continuous Blood Gluc Receiver (FREESTYLE LIBRE READER) DEVI 1 Device by Does not apply route 3 (three) times daily. 1 Device 1  . Continuous Blood Gluc Sensor (FREESTYLE LIBRE SENSOR SYSTEM) MISC 1 Device by Does not apply route every 30 (thirty) days. 3 each 11  . diphenoxylate-atropine (LOMOTIL) 2.5-0.025 MG tablet Take 1-2 tablets by mouth 2 (two) times daily as needed for diarrhea or loose stools. 60 tablet 1  . Insulin Pen Needle 32G X 4 MM MISC Use to inject insulins. 100 each 3  . insulin regular human CONCENTRATED (HUMULIN R U-500 KWIKPEN) 500 UNIT/ML kwikpen Inject 85 units under the skin before breakfast and 100 units before dinner. 18 pen 1  . loperamide (IMODIUM A-D) 2 MG tablet Take 1-2 tablets (2-4 mg total) by mouth daily as needed for diarrhea or loose stools. 30 tablet 1  . LORazepam (ATIVAN) 0.5 MG tablet 1 tablet by mouth 30 minutes before MRI. May repeat x 1 if needed 2 tablet 0  . losartan (COZAAR) 100 MG tablet TAKE 1 TABLET BY MOUTH ONCE DAILY 90 tablet 0  . nitroGLYCERIN (NITROSTAT) 0.4 MG SL tablet Place 1 tablet (0.4 mg total) under the tongue every 5 (five) minutes as needed for chest pain. 25 tablet 3  . ONETOUCH VERIO test strip Use to test blood sugar 3 times daily.    . Pancrelipase, Lip-Prot-Amyl, 24000-76000 units CPEP Take 1 capsule (24,000 Units total) by mouth 3 (three) times daily before meals. 90 capsule 1  . promethazine (PHENERGAN) 25 MG tablet Take 0.5 tablets (12.5 mg total) by mouth every 6 (six) hours as needed for nausea or vomiting. 30 tablet 1  . rosuvastatin (CRESTOR) 20 MG tablet TAKE 1 TABLET(20 MG) BY MOUTH DAILY 30 tablet 6  . ruxolitinib phosphate (JAKAFI) 25 MG tablet Take 1 tablet (25 mg total) by mouth 2 (two) times daily. 60 tablet 11   No current facility-administered medications on file prior to visit.     BP (!) 148/58    Pulse 99   Temp 98.4 F (36.9 C) (Oral)   Ht 5' 5"  (1.651 m)   Wt 224 lb (101.6 kg)   SpO2 99%   BMI 37.28 kg/m       Objective:   Physical Exam  Constitutional: She is oriented to person, place, and time. She appears well-developed and well-nourished.  HENT:  Head: Normocephalic and atraumatic.  Cardiovascular: Normal rate, regular rhythm and normal heart  sounds.   No murmur heard. Pulmonary/Chest: Effort normal and breath sounds normal. No respiratory distress. She has no wheezes.  Abdominal: Soft. She exhibits no distension. There is no tenderness. There is no rebound.  No ascites noted  Musculoskeletal: She exhibits no edema.  Neurological: She is alert and oriented to person, place, and time.  Skin: Skin is warm and dry.  Psychiatric: She has a normal mood and affect. Her behavior is normal. Judgment and thought content normal.          Assessment & Plan:  Acute renal failure- resolved.  Thankfully MRI of kidney neg for mass.  Advised pt OK to hold off on renal referral.

## 2017-06-14 NOTE — Assessment & Plan Note (Signed)
Clinically stable. Management per endo.

## 2017-06-14 NOTE — Patient Instructions (Signed)
Please follow up in 6 months, sooner if problems/concerns.  You will be contacted about your referral to hepatology.

## 2017-06-14 NOTE — Assessment & Plan Note (Signed)
Clinically compensated. Reinforced abstinence from alcohol (she does not drink). Discussed importance of weight loss/low fat diet (due to hepatic steatosis). Will refer to hepatology for ongoing management.

## 2017-06-14 NOTE — Assessment & Plan Note (Signed)
Clinically stable, management per cardiology.

## 2017-06-21 ENCOUNTER — Other Ambulatory Visit (HOSPITAL_BASED_OUTPATIENT_CLINIC_OR_DEPARTMENT_OTHER): Payer: Medicare Other

## 2017-06-21 ENCOUNTER — Ambulatory Visit (HOSPITAL_BASED_OUTPATIENT_CLINIC_OR_DEPARTMENT_OTHER): Payer: Medicare Other | Admitting: Hematology & Oncology

## 2017-06-21 VITALS — BP 154/78 | HR 78 | Temp 97.9°F | Resp 19 | Wt 223.0 lb

## 2017-06-21 DIAGNOSIS — D45 Polycythemia vera: Secondary | ICD-10-CM

## 2017-06-21 DIAGNOSIS — D508 Other iron deficiency anemias: Secondary | ICD-10-CM

## 2017-06-21 LAB — CBC WITH DIFFERENTIAL (CANCER CENTER ONLY)
HEMATOCRIT: 35.3 % (ref 34.8–46.6)
HEMOGLOBIN: 11.6 g/dL (ref 11.6–15.9)
MCH: 30.4 pg (ref 26.0–34.0)
MCHC: 32.9 g/dL (ref 32.0–36.0)
MCV: 92 fL (ref 81–101)
Platelets: 340 10*3/uL (ref 145–400)
RBC: 3.82 10*6/uL (ref 3.70–5.32)
RDW: 16.3 % — ABNORMAL HIGH (ref 11.1–15.7)
WBC: 15.7 10*3/uL — ABNORMAL HIGH (ref 3.9–10.0)

## 2017-06-21 LAB — MANUAL DIFFERENTIAL (CHCC SATELLITE)
ALC: 2.2 10*3/uL (ref 0.6–2.2)
ANC (CHCC MAN DIFF): 12.7 10*3/uL — AB (ref 1.5–6.7)
Band Neutrophils: 4 % (ref 0–10)
Eos: 2 % (ref 0–7)
LYMPH: 14 % (ref 14–48)
METAMYELOCYTES PCT: 1 % — AB (ref 0–0)
MONO: 3 % (ref 0–13)
MYELOCYTES: 1 % — AB (ref 0–0)
NRBC: 1 % — AB (ref 0–0)
PLT EST ~~LOC~~: ADEQUATE
SEG: 75 % (ref 40–75)

## 2017-06-21 LAB — FERRITIN: Ferritin: 266 ng/ml (ref 9–269)

## 2017-06-21 LAB — CMP (CANCER CENTER ONLY)
ALK PHOS: 115 U/L — AB (ref 26–84)
ALT: 28 U/L (ref 10–47)
AST: 46 U/L — AB (ref 11–38)
Albumin: 3.7 g/dL (ref 3.3–5.5)
BILIRUBIN TOTAL: 1 mg/dL (ref 0.20–1.60)
BUN: 10 mg/dL (ref 7–22)
CALCIUM: 8.9 mg/dL (ref 8.0–10.3)
CO2: 25 mEq/L (ref 18–33)
CREATININE: 1.1 mg/dL (ref 0.6–1.2)
Chloride: 107 mEq/L (ref 98–108)
GLUCOSE: 280 mg/dL — AB (ref 73–118)
Potassium: 3.9 mEq/L (ref 3.3–4.7)
Sodium: 140 mEq/L (ref 128–145)
Total Protein: 7 g/dL (ref 6.4–8.1)

## 2017-06-21 LAB — IRON AND TIBC
%SAT: 30 % (ref 21–57)
Iron: 78 ug/dL (ref 41–142)
TIBC: 258 ug/dL (ref 236–444)
UIBC: 179 ug/dL (ref 120–384)

## 2017-06-21 LAB — LACTATE DEHYDROGENASE: LDH: 319 U/L — ABNORMAL HIGH (ref 125–245)

## 2017-06-21 NOTE — Progress Notes (Signed)
Hematology and Oncology Follow Up Visit  Amanda [name] [id number] 03-19-1941 76 y.o. 06/21/2017   Principle Diagnosis:  [name]-JAK2 (+)  Current Therapy:   Jakafi 62m PO BID   Interim History:  Amanda [name] here today with Amanda [name] husband for follow-up. Thankfully, Amanda [name] is doing much better. Unfortunately, Amanda [name] did fall this today. It sounds like Amanda [name] got out of the shower and Amanda [name] foot got stuck in the shower couldn't. Amanda [name] fell forward. Amanda [name] fell on Amanda [name] left side. Thankfully, Amanda [name] did not break anything. I must say that Amanda [name] does not have any bruises on Amanda [name] body.  Amanda [name] is doing well with the [name] blood counts have come down quite nicely.  Amanda [name] blood sugars still are problem. Today Amanda [name] blood sugar was 285. Amanda [name] does have a fatty liver. Amanda [name] is seeing a hepatologist.   When we last saw Amanda [name] back in July, Amanda [name] ferritin was 407 with iron saturation of 37%.   Amanda [name]'s had no fever. Amanda [name]'s had no cough.   Amanda [name]'s getting ready for the [employer]. Amanda [name] really is looking forward to this. Amanda [name] does a lot of cooking.   There is no change in bowel or bladder habits.   Currently, Amanda [name] performance status is ECOG 1.   Medications:  Allergies as of 06/21/2017      Reactions   Lisinopril Cough   Tape Hives   Doxycycline Hives, Swelling, Rash   Latex Hives, Itching, Rash      Medication List       Accurate as of 06/21/17  1:12 PM. Always use your most recent med list.          amLODipine 5 MG tablet Commonly known as:  NORVASC Take 1 tablet (5 mg total) by mouth daily.   aspirin EC 81 MG tablet Take 81 mg by mouth daily.   canagliflozin 100 MG Tabs tablet Commonly known as:  INVOKANA Take 1 tablet (100 mg total) by mouth daily.   diphenoxylate-atropine 2.5-0.025 MG tablet Commonly known as:  LOMOTIL Take 1-2 tablets by mouth 2 (two) times daily as needed for diarrhea or loose stools.   FREESTYLE LIBRE READER Devi 1 Device by Does not apply route 3 (three) times daily.   FREESTYLE LIBRE SENSOR SYSTEM Misc 1 Device by Does not apply route every 30 (thirty) days.   Insulin Pen Needle 32G X 4 MM Misc Use to inject insulins.   insulin regular human CONCENTRATED 500 UNIT/ML kwikpen Commonly known as:  HUMULIN R U-500 KWIKPEN Inject 85 units under the skin before breakfast and 100 units before dinner.   loperamide 2 MG tablet Commonly known as:  IMODIUM A-D Take 1-2 tablets (2-4 mg total) by mouth daily as needed for diarrhea or loose stools.   LORazepam 0.5 MG tablet Commonly known as:  ATIVAN 1 tablet by mouth 30 minutes before MRI. May repeat x 1 if needed   losartan 100 MG tablet Commonly known as:  COZAAR TAKE 1 TABLET BY MOUTH ONCE DAILY   nitroGLYCERIN 0.4 MG SL tablet Commonly known as:  NITROSTAT Place 1 tablet (0.4 mg total) under the tongue every 5 (five) minutes as needed for chest pain.   ONETOUCH VERIO test strip Generic drug:  glucose blood Use to test blood sugar 3 times daily.   Pancrelipase (Lip-Prot-Amyl) 24000-76000 units Cpep Take 1 capsule (24,000 Units total) by mouth 3 (three) times daily before meals.   promethazine 25 MG tablet Commonly known as:  PHENERGAN Take 0.5 tablets (  12.5 mg total) by mouth every 6 (six) hours as needed for nausea or vomiting.   rosuvastatin 20 MG tablet Commonly known as:  CRESTOR TAKE 1 TABLET(20 MG) BY MOUTH DAILY   ruxolitinib phosphate 25 MG tablet Commonly known as:  JAKAFI Take 1 tablet (25 mg total) by mouth 2 (two) times daily.       Allergies:  Allergies  Allergen Reactions  . Lisinopril Cough  . Tape Hives  . Doxycycline Hives, Swelling and Rash  . Latex Hives, Itching and Rash    Past Medical History, Surgical history, Social history, and Family History were reviewed and updated.  Review of Systems: As stated in the interim history  Physical Exam:  weight is 223 lb (101.2 kg). Amanda [name] oral temperature is 97.9 F (36.6 C). Amanda [name] blood pressure is 154/78 (abnormal) and  Amanda [name] pulse is 78. Amanda [name] respiration is 19 and oxygen saturation is 100%.   Wt Readings from Last 3 Encounters:  06/21/17 223 lb (101.2 kg)  06/14/17 224 lb (101.6 kg)  05/17/17 219 lb (99.3 kg)    Physical Exam  Constitutional: Amanda [name] is oriented to person, place, and time. Amanda [name] appears well-developed.  ,but moderately obese  HENT:  Head: Normocephalic and atraumatic.  Mouth/Throat: Oropharynx is clear and moist.  Eyes: Pupils are equal, round, and reactive to light. EOM are normal.  Neck: Normal range of motion.  Cardiovascular: Normal rate, regular rhythm and normal heart sounds.   Pulmonary/Chest: Effort normal and breath sounds normal.  Abdominal:  Moderately obese but soft. Amanda [name] has good bowel sounds. There is no palpable liver or spleen tip.  Musculoskeletal: Normal range of motion. Amanda [name] exhibits no edema, tenderness or deformity.  Lymphadenopathy:    Amanda [name] has no cervical adenopathy.  Amanda [name] has no axillary adenopathy. There is no supraclavicular or inguinal adenopathy.  Neurological: Amanda [name] is alert and oriented to person, place, and time.  Skin: Skin is warm and dry. No rash noted. No erythema.  Psychiatric: Amanda [name] has a normal mood and affect. Amanda [name] behavior is normal. Judgment and thought content normal.  Vitals reviewed.    Lab Results  Component Value Date   WBC 15.7 (H) 06/21/2017   HGB 11.6 06/21/2017   HCT 35.3 06/21/2017   MCV 92 06/21/2017   PLT 340 06/21/2017   Lab Results  Component Value Date   FERRITIN 407 (H) 05/17/2017   IRON 97 05/17/2017   TIBC 261 05/17/2017   UIBC 164 05/17/2017   IRONPCTSAT 37 05/17/2017   Lab Results  Component Value Date   RETICCTPCT 2.4 01/30/2014   RBC 3.82 06/21/2017   No results found for: KPAFRELGTCHN, LAMBDASER, [employer] Lab Results  Component Value Date   IGA 233 03/29/2014   No results found for: Odetta Pink, SPEI   Chemistry      Component Value Date/Time   NA 140  06/21/2017 1038   NA 138 01/21/2017 0806   K 3.9 06/21/2017 1038   K 4.1 01/21/2017 0806   CL 107 06/21/2017 1038   CL 103 02/16/2013 0900   CO2 25 06/21/2017 1038   CO2 21 (L) 01/21/2017 0806   BUN 10 06/21/2017 1038   BUN 12.2 01/21/2017 0806   CREATININE 1.1 06/21/2017 1038   CREATININE 1.0 01/21/2017 0806      Component Value Date/Time   CALCIUM 8.9 06/21/2017 1038   CALCIUM 9.3 01/21/2017 0806   ALKPHOS 115 (H) 06/21/2017 1038   ALKPHOS 112 01/21/2017 0806  AST 46 (H) 06/21/2017 1038   AST 37 (H) 01/21/2017 0806   ALT 28 06/21/2017 1038   ALT 16 01/21/2017 0806   BILITOT 1.00 06/21/2017 1038   BILITOT 0.89 01/21/2017 0806      Impression and Plan: Amanda [name] is a very pleasant 76 yo caucasian female with polycythemia vera, JAK2 positive. Amanda [name] is taking Amanda [name] Jakafi BID as prescribed.   I'm glad that Amanda [name] blood counts are better. I will not change the JAKAFI dose.  We will get Amanda [name] back in another 6 weeks now. I think this would be reasonable.  I still think that Amanda [name] main problem will have to be Amanda [name] diabetes and not the polycythemia.  Volanda Napoleon, MD 9/4/20181:12 PM

## 2017-07-26 ENCOUNTER — Ambulatory Visit (INDEPENDENT_AMBULATORY_CARE_PROVIDER_SITE_OTHER): Payer: Medicare Other | Admitting: Cardiovascular Disease

## 2017-07-26 ENCOUNTER — Encounter: Payer: Self-pay | Admitting: Cardiovascular Disease

## 2017-07-26 VITALS — BP 148/72 | HR 82 | Ht 64.5 in | Wt 225.0 lb

## 2017-07-26 DIAGNOSIS — I10 Essential (primary) hypertension: Secondary | ICD-10-CM

## 2017-07-26 DIAGNOSIS — E118 Type 2 diabetes mellitus with unspecified complications: Secondary | ICD-10-CM

## 2017-07-26 DIAGNOSIS — Z794 Long term (current) use of insulin: Secondary | ICD-10-CM | POA: Diagnosis not present

## 2017-07-26 DIAGNOSIS — E785 Hyperlipidemia, unspecified: Secondary | ICD-10-CM

## 2017-07-26 DIAGNOSIS — I2510 Atherosclerotic heart disease of native coronary artery without angina pectoris: Secondary | ICD-10-CM | POA: Diagnosis not present

## 2017-07-26 DIAGNOSIS — D45 Polycythemia vera: Secondary | ICD-10-CM

## 2017-07-26 NOTE — Progress Notes (Signed)
Patient ID: Amanda [name], female   DOB: 04-Jul-1941, 76 y.o.   MRN: [id number]     HPI: Amanda [name] is a 76 y.o. female who presents to the office today for an 8 month follow-up cardiology evaluation.    Amanda [name] has a history of hypertension, type 2 diabetes mellitus, polycythemia vera, as well as peripheral neuropathy.  She developed new onset chest pain on 01/27/2015 and was found have 3 mm ST segment elevation by EMS.  During transport to the hospital she developed idioventricular rhythm which was treated with amiodarone bolus.  I took her acutely to the cardiac catheterization laboratory on 01/27/2015.  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  She underwent successful PCI intervention with  insertion of a  Xience Alpine 3.023 mm DES stent postdilated to 3.15 millimeters in the mid lesion and the distal stenosis was treated with a 2.518 mm Xience Alpine DES stent postdilated to 2.57 mm.  It was felt that her idioventricular rhythm was probably the result of spontaneous reperfusion in route to the hospital.  She initially was discharged after 3 days to [facility] rehabilitation which did she not find to be helpful and went home 3 days later with home health care coming to her house.  During her hospitalization, an echo Doppler study revealed an EF of 50-55%.  She had abnormal tissue Doppler.  There is mitral annular calcification and moderate LA dilatation.  She had abnormal posterior lateral and septal strain.  When I saw her last year she was having difficulty with bloating and diarrhea.  She denied any recurrent episodes of chest pain.  She admits to exertional shortness of breath.  She is not very active and does not ambulate well.   After one year of full dose dual antiplatelet therapy, she recently  was started on reduced dose of Brilinta at 60 mg twice a day per Pegasys trial data.  She was hospitalized from April 12 through 01/30/2016 a several week history of shortness of breath.  Her hospitalization she was seen by cardiology and was not felt to be due to CHF.  She presented with markedly elevated glucose with metabolic acidosis but without an anion gap.  She has continued to be on Jakafi for  polycythemia vera.  She was found to have a 7 x 6 mm nodule opacify in the superior segment of the right lower lobe on CT and a hollow of noncontrast chest CT at 6-12 months was recommended.  She has continued to have fatigue.  She saw Dr. Marin [name] of her in follow-up evaluation and he was concerned that additional evaluation of her myeloproliferative disorder and according to the patient.   With reference to her fatigue, she is extremely tired.  At times she may go to bed at 6:30 at night.  She does snore.  Her sleep is nonrestorative.  She admits to daytime sleepiness.  She has never had an evaluation for sleep apnea and when I last saw her, I strongly recommended a sleep study for further evaluation.  Since I last saw her, she was hospitalized [place] [facility] .  During the summer.  Multiple family members were coughing week prior to the hospitalization.  Reportedly, she spent 8 days in the hospital  and developed kidney insufficiency.  Reportedly, her creatinine had increased to 4 before trending down.  Venous Dopplers were negative for DVT.  A right renal mass was seen, which she has undergone subsequent evaluation.  She saw Amanda [name] in August 2018.  An MRI of her kidney was negative for mass.  She also has seen Dr. Marin [name] for her polycythemia vera.  Presently, she denies any episodes of chest pain.  She has not been successful with weight loss.  He has seen a liver specialist apparently may have hepatic steatosis rather than cirrhosis.  She was told that she  was stable.  She presents for evaluation   Past Medical History:  Diagnosis Date  . Allergic rhinitis 01/23/2016  . C. difficile colitis   . CAD (coronary artery disease)    a. s/p STEMI in 01/2015 with 95% LCx stenosis and distal 80% LCx stenosis (DESx2 placed)  . Cancer ([place])    SKIN  . Candidiasis of skin 09/30/2014  . Cirrhosis ([employer])   . Depression   . Diabetes mellitus 2008  . Gout   . Herpes simplex   . Hyperglycemia 05/31/2013  . Hyperlipidemia   . Hypertension   . Myocardial infarction ([place])   . Neuromuscular disorder ([place])    BELL PALSY  . Obesity   . Polycythemia    Dr. Elease [name]- [facility] hemat[facility]  . Psoriasis     Past Surgical History:  Procedure Laterality Date  . BREAST SURGERY Left    milk duct  . COLONOSCOPY    . LEFT HEART CATHETERIZATION WITH CORONARY ANGIOGRAM N/A 01/27/2015   Procedure: LEFT HEART CATHETERIZATION WITH CORONARY ANGIOGRAM;  Surgeon: [name], MD;  Location: [facility] CATH LAB;  Service: Cardiovascular;  Laterality: N/A;  . TONSILLECTOMY    . TOTAL ABDOMINAL HYSTERECTOMY W/ BILATERAL SALPINGOOPHORECTOMY     for heavy periods with appendectomy    Allergies  Allergen Reactions  . Lisinopril Cough  . Tape Hives  . Doxycycline Hives, Swelling and Rash  . Latex Hives, Itching and Rash    Current Outpatient Prescriptions  Medication Sig Dispense Refill  . amLODipine (NORVASC) 5 MG tablet Take 1 tablet (5 mg total) by mouth daily. 30 tablet 1  . aspirin EC 81 MG tablet Take 81 mg by mouth daily.    . canagliflozin (INVOKANA) 100 MG TABS tablet Take 1 tablet (100 mg total) by mouth daily. 30 tablet 5  . Continuous Blood Gluc Receiver (FREESTYLE LIBRE READER) DEVI 1 Device by Does not apply route 3 (three) times daily. 1 Device 1  . Continuous Blood Gluc Sensor (FREESTYLE LIBRE SENSOR SYSTEM) MISC 1 Device by Does not apply route every 30 (thirty) days. 3 each 11  . diphenoxylate-atropine (LOMOTIL) 2.5-0.025 MG tablet Take 1-2 tablets by mouth 2  (two) times daily as needed for diarrhea or loose stools. 60 tablet 1  . Insulin Pen Needle 32G X 4 MM MISC Use to inject insulins. 100 each 3  . insulin regular human CONCENTRATED (HUMULIN R U-500 KWIKPEN) 500 UNIT/ML kwikpen Inject 85 units under the skin before breakfast and 100 units before dinner. 18 pen 1  . loperamide (IMODIUM A-D) 2 MG tablet Take 1-2 tablets (2-4 mg total) by mouth daily as needed for diarrhea or loose stools. 30 tablet 1  . LORazepam (ATIVAN) 0.5 MG tablet 1 tablet by mouth 30 minutes before MRI. May repeat x 1 if needed 2 tablet 0  . losartan (COZAAR) 100 MG tablet TAKE 1 TABLET BY MOUTH  ONCE DAILY 90 tablet 0  . nitroGLYCERIN (NITROSTAT) 0.4 MG SL tablet Place 1 tablet (0.4 mg total) under the tongue every 5 (five) minutes as needed for chest pain. 25 tablet 3  . ONETOUCH VERIO test strip Use to test blood sugar 3 times daily.    . Pancrelipase, Lip-Prot-Amyl, 24000-76000 units CPEP Take 1 capsule (24,000 Units total) by mouth 3 (three) times daily before meals. 90 capsule 1  . promethazine (PHENERGAN) 25 MG tablet Take 0.5 tablets (12.5 mg total) by mouth every 6 (six) hours as needed for nausea or vomiting. 30 tablet 1  . rosuvastatin (CRESTOR) 20 MG tablet TAKE 1 TABLET(20 MG) BY MOUTH DAILY 30 tablet 6  . ruxolitinib phosphate (JAKAFI) 25 MG tablet Take 1 tablet (25 mg total) by mouth 2 (two) times daily. 60 tablet 11   No current facility-administered medications for this visit.     Social History   Social History  . Marital status: Married    Spouse name: N/A  . Number of children: 2  . Years of education: 2 yr colle   Occupational History  . housewife    Social History Main Topics  . Smoking status: Former Smoker    Years: 48.00    Types: Cigarettes    Quit date: 10/18/1986  . Smokeless tobacco: Never Used  . Alcohol use No  . Drug use: No  . Sexual activity: Yes    Partners: Male    Birth control/ protection: None   Other Topics Concern  .  Not on file   Social History Narrative   2 caffeine drink per day.  No regular exercise.     Retired from the bank   2 children (Daughter and a son) son has morbid obesity   2 grandchildren   3 great grandchildren    Family History  Problem Relation Age of Onset  . Diabetes Mother   . Heart disease Mother        CAD  . Hyperlipidemia Mother   . Hypertension Mother   . Kidney disease Mother   . Kidney disease Father   . Breast cancer Maternal Aunt   . Heart disease Maternal Grandmother   . Heart disease Other        maternal aunts and uncles    ROS General: Negative; No fevers, chills, or night sweats HEENT: Negative; No changes in vision or hearing, sinus congestion, difficulty swallowing Pulmonary: Negative; No cough, wheezing, shortness of breath, hemoptysis Cardiovascular: See HPI: No chest pain, presyncope, syncope, palpatations GI: Positive for abdominal bloating and occasional to frequent diarrhea. GU: Negative; No dysuria, hematuria, or difficulty voiding Musculoskeletal: Negative; no myalgias, joint pain, or weakness Hematologic: Positive for polycythemia vera and anemia and leukocytosis. Endocrine: Negative; no heat/cold intolerance; no diabetes, Neuro: Negative; no changes in balance, headaches Skin: Negative; No rashes or skin lesions Psychiatric: Negative; No behavioral problems, depression Sleep: Positive for snoring, nonrestorative sleep,daytime sleepiness, and hypersomnolence; no bruxism, restless legs, hypnogognic hallucinations. Other comprehensive 14 point system review is negative   Physical Exam BP (!) 148/72   Pulse 82   Ht 5' 4.5" (1.638 m)   Wt 225 lb (102.1 kg)   BMI 38.02 kg/m    Repeat blood pressure by me was 130/76.  Wt Readings from Last 3 Encounters:  07/26/17 225 lb (102.1 kg)  06/21/17 223 lb (101.2 kg)  06/14/17 224 lb (101.6 kg)    General: Alert, oriented, no distress.  Skin: normal turgor, no rashes, warm and  dry HEENT:  Normocephalic, atraumatic. Pupils equal round and reactive to light; sclera anicteric; extraocular muscles intact;  Nose without nasal septal hypertrophy Mouth/Parynx benign; Mallinpatti scale 3; upper and lower dentures Neck: No JVD, no carotid bruits; normal carotid upstroke Lungs: clear to ausculatation and percussion; no wheezing or rales Chest wall: without tenderness to palpitation Heart: PMI not displaced, RRR, s1 s2 normal, 1/6 systolic murmur, no diastolic murmur, no rubs, gallops, thrills, or heaves Abdomen: soft, nontender; no hepatosplenomehaly, BS+; abdominal aorta nontender and not dilated by palpation. Back: no CVA tenderness Pulses 2+ Musculoskeletal: full range of motion, normal strength, no joint deformities Extremities: no clubbing cyanosis or edema, Homan's sign negative  Neurologic: grossly nonfocal; Cranial nerves grossly wnl Psychologic: Normal mood and affect   ECG (independently read by me): Sinus rhythm at 82 bpm with 1 PVC.  QTC 476 ms.  June 2017 ECG (independently read by me): Normal sinus rhythm 80 bpm.  No ECG evidence for prior MI.  QTC interval 463 ms.  July 2016 ECG (independently read by me):  Normal sinus rhythm at 96 bpm. No significant ST changes.  No ECG evidence of prior MI.  LABS:  BMP Latest Ref Rng & Units 06/21/2017 05/17/2017 04/27/2017  Glucose 73 - 118 mg/dL 280(H) 304(H) 379(H)  BUN 7 - 22 mg/dL _0 Creatinine 0.6 - 1.2 mg/dl 1.1 1.1 0.8  BUN/Creat Ratio 12 - 28 - - -  Sodium 128 - 145 mEq/L 140 137 135  Potassium 3.3 - 4.7 mEq/L 3.9 3.8 4.0  Chloride 98 - 108 mEq/L 107 107 103  CO2 18 - 33 mEq/L _1 Calcium 8.0 - 10.3 mg/dL 8.9 9.3 9.2    Hepatic Function Latest Ref Rng & Units 06/21/2017 05/17/2017 04/27/2017  Total Protein 6.4 - 8.1 g/dL 7.0 6.6 6.6  Albumin 3.3 - 5.5 g/dL 3.7 3.4 3.5  AST 11 - 38 U/L 46(H) 50(H) 31  ALT 10 - 47 U/L _2 Alk Phosphatase 26 - 84 U/L 115(H) 100(H) 113(H)  Total Bilirubin 0.20 - 1.60  mg/dl 1.00 0.90 1.00   CBC Latest Ref Rng & Units 06/21/2017 05/17/2017 04/27/2017  WBC 3.9 - 10.0 10e3/uL 15.7(H) 30.7(H) 23.1(H)  Hemoglobin 11.6 - 15.9 g/dL 11.6 11.7 10.5(L)  Hematocrit 34.8 - 46.6 % 35.3 35.3 31.1(L)  Platelets 145 - 400 10e3/uL 340 612(H) 459 Few Large platelets present(H)   Lab Results  Component Value Date   MCV 92 06/21/2017   MCV 94 05/17/2017   MCV 90 04/27/2017    Lab Results  Component Value Date   TSH 3.304 02/25/2017    BNP    Component Value Date/Time   BNP 77.9 01/28/2016 1647   BNP 56.3 01/26/2016 1340    ProBNP    Component Value Date/Time   PROBNP 311.6 (H) 01/29/2014 1203     Lipid Panel     Component Value Date/Time   CHOL 207 (H) 02/14/2017 1122   TRIG 243.0 (H) 02/14/2017 1122   HDL 34.00 (L) 02/14/2017 1122   CHOLHDL 6 02/14/2017 1122   VLDL 48.6 (H) 02/14/2017 1122   LDLCALC 88 01/27/2015 1320   LDLDIRECT 103.0 02/14/2017 1122     RADIOLOGY: No results found.  IMPRESSION:  1. Coronary artery disease involving native coronary artery of native heart without angina pectoris   2. Hyperlipidemia, unspecified hyperlipidemia type   3. [name] ([place])   4. Hyperlipidemia LDL goal <70   5. Essential hypertension  6. Type 2 diabetes mellitus with complication, with long-term current use of insulin [facility])     ASSESSMENT AND PLAN: Ms Shery Wauneka is a 76 year old Caucasian female who is originally from [place] who presented on 01/27/2015 with a left circumflex STEMI.  She most likely had some spontaneous reperfusion which led to transient idioventricular rhythm.  She underwent successful intervention.  Her echo Doppler study 2 days later showed an EF of 50-55% with fairly normal wall motion.  When I initially saw her in the office she was not well beta blocked and further titrated her beta blocker to metoprolol 100 mg twice a day.  Presently, her pulse is in the 80s.  She has stable blood pressure on repeat by  me on amlodipine 5 mg, losartan 100 mg.  She had developed renal insufficiency while in [place] but apparently this resolved.  She continues to be on rosuvastatin 20 mg for hyperlipidemia with target LDL less than 70.  She is on [facility] for polycythemia vera and followed by Dr. Marin [name].  She is diabetic on insulin and invokana.  She is morbidly obese.  Weight loss is recommended.  She was recently evaluated by a liver specialist and was not told of any major abnormality.  In the past.  I discussed a sleep study.  It does not appear that this was done.  A sleep study is done.  I will see her in follow-up otherwise will see her in one year for reevaluation.  Time spent: 25 minutes  [name], MD, [facility]  07/28/2017 7:02 PM

## 2017-07-26 NOTE — Patient Instructions (Signed)
Medication Instructions:  Your physician recommends that you continue on your current medications as directed. Please refer to the Current Medication list given to you today.  Follow-Up: Your physician wants you to follow-up in: 28 MONTHS with Dr. [name]. You will receive a reminder letter in the mail two months in advance. If you don't receive a letter, please call our office to schedule the follow-up appointment.   Any Other Special Instructions Will Be Listed Below (If Applicable).     If you need a refill on your cardiac medications before your next appointment, please call your pharmacy.

## 2017-08-04 ENCOUNTER — Ambulatory Visit (HOSPITAL_BASED_OUTPATIENT_CLINIC_OR_DEPARTMENT_OTHER): Payer: Medicare Other | Admitting: Hematology & Oncology

## 2017-08-04 ENCOUNTER — Other Ambulatory Visit: Payer: Self-pay | Admitting: Internal Medicine

## 2017-08-04 ENCOUNTER — Other Ambulatory Visit (HOSPITAL_BASED_OUTPATIENT_CLINIC_OR_DEPARTMENT_OTHER): Payer: Medicare Other

## 2017-08-04 VITALS — BP 156/60 | HR 89 | Temp 97.9°F | Resp 20 | Wt 224.0 lb

## 2017-08-04 DIAGNOSIS — D45 Polycythemia vera: Secondary | ICD-10-CM

## 2017-08-04 DIAGNOSIS — K5900 Constipation, unspecified: Secondary | ICD-10-CM | POA: Diagnosis not present

## 2017-08-04 LAB — CBC WITH DIFFERENTIAL (CANCER CENTER ONLY)
BASO#: 0.2 10*3/uL (ref 0.0–0.2)
BASO%: 1 % (ref 0.0–2.0)
EOS ABS: 0.4 10*3/uL (ref 0.0–0.5)
EOS%: 2.3 % (ref 0.0–7.0)
HEMATOCRIT: 36.7 % (ref 34.8–46.6)
HEMOGLOBIN: 12.1 g/dL (ref 11.6–15.9)
LYMPH#: 1.1 10*3/uL (ref 0.9–3.3)
LYMPH%: 6.3 % — ABNORMAL LOW (ref 14.0–48.0)
MCH: 29.7 pg (ref 26.0–34.0)
MCHC: 33 g/dL (ref 32.0–36.0)
MCV: 90 fL (ref 81–101)
MONO#: 0.9 10*3/uL (ref 0.1–0.9)
MONO%: 5.4 % (ref 0.0–13.0)
NEUT%: 85 % — ABNORMAL HIGH (ref 39.6–80.0)
NEUTROS ABS: 14.2 10*3/uL — AB (ref 1.5–6.5)
Platelets: 420 10*3/uL — ABNORMAL HIGH (ref 145–400)
RBC: 4.07 10*6/uL (ref 3.70–5.32)
RDW: 16.2 % — ABNORMAL HIGH (ref 11.1–15.7)
WBC: 16.7 10*3/uL — AB (ref 3.9–10.0)

## 2017-08-04 LAB — CMP (CANCER CENTER ONLY)
ALBUMIN: 3.4 g/dL (ref 3.3–5.5)
ALK PHOS: 104 U/L — AB (ref 26–84)
ALT(SGPT): 26 U/L (ref 10–47)
AST: 36 U/L (ref 11–38)
BILIRUBIN TOTAL: 1 mg/dL (ref 0.20–1.60)
BUN: 16 mg/dL (ref 7–22)
CALCIUM: 9.1 mg/dL (ref 8.0–10.3)
CHLORIDE: 104 meq/L (ref 98–108)
CO2: 24 meq/L (ref 18–33)
Creat: 1.1 mg/dl (ref 0.6–1.2)
GLUCOSE: 452 mg/dL — AB (ref 73–118)
Potassium: 3.8 mEq/L (ref 3.3–4.7)
SODIUM: 137 meq/L (ref 128–145)
Total Protein: 6.3 g/dL — ABNORMAL LOW (ref 6.4–8.1)

## 2017-08-04 LAB — IRON AND TIBC
%SAT: 23 % (ref 21–57)
Iron: 59 ug/dL (ref 41–142)
TIBC: 260 ug/dL (ref 236–444)
UIBC: 202 ug/dL (ref 120–384)

## 2017-08-04 LAB — FERRITIN: Ferritin: 184 ng/ml (ref 9–269)

## 2017-08-04 LAB — LACTATE DEHYDROGENASE: LDH: 308 U/L — ABNORMAL HIGH (ref 125–245)

## 2017-08-04 LAB — TECHNOLOGIST REVIEW CHCC SATELLITE

## 2017-08-04 LAB — CHCC SATELLITE - SMEAR

## 2017-08-04 NOTE — Progress Notes (Signed)
Hematology and Oncology Follow Up Visit  THOMASENA VANDENHEUVEL [id number] 1941-01-23 76 y.o. 08/04/2017   Principle Diagnosis:  [name]-JAK2 (+)  Current Therapy:   Jakafi 15m PO BID   Interim History:  Ms. KGoodwillis here today with her husband for follow-up. As always, her problem is her blood sugars. Her blood sugar today is 452. She chest does not take her insulin like she needs to.  She has had no nausea or vomiting. She is eating okay. She's had no cough. There's been no rashes. She's had no diarrhea. She is a little constipated.  There's been no leg swelling. She's had no weakness.  On occasion, she just may not feel all that well. When she doesn't feel well, dice when she does not take her insulin.  Thankfully, she sees her diabetic doctor next week. Hopefully, she will adhere to the doctor's recommendations.  She's had no fever.  Overall, her performance status is ECOG 1.   Medications:  Allergies as of 08/04/2017      Reactions   Lisinopril Cough   Tape Hives   Doxycycline Hives, Swelling, Rash   Latex Hives, Itching, Rash      Medication List       Accurate as of 08/04/17 10:36 AM. Always use your most recent med list.          amLODipine 5 MG tablet Commonly known as:  NORVASC Take 1 tablet (5 mg total) by mouth daily.   aspirin EC 81 MG tablet Take 81 mg by mouth daily.   canagliflozin 100 MG Tabs tablet Commonly known as:  INVOKANA Take 1 tablet (100 mg total) by mouth daily.   diphenoxylate-atropine 2.5-0.025 MG tablet Commonly known as:  LOMOTIL Take 1-2 tablets by mouth 2 (two) times daily as needed for diarrhea or loose stools.   FLUZONE HIGH-DOSE 0.5 ML injection Generic drug:  Influenza vac split quadrivalent PF ADM 0.5ML IM UTD   FREESTYLE LIBRE READER Devi 1 Device by Does not apply route 3 (three) times daily.   FREESTYLE LIBRE SENSOR SYSTEM Misc 1 Device by Does not apply route every 30 (thirty) days.   Insulin Pen Needle  32G X 4 MM Misc Use to inject insulins.   insulin regular human CONCENTRATED 500 UNIT/ML kwikpen Commonly known as:  HUMULIN R U-500 KWIKPEN Inject 85 units under the skin before breakfast and 100 units before dinner.   loperamide 2 MG tablet Commonly known as:  IMODIUM A-D Take 1-2 tablets (2-4 mg total) by mouth daily as needed for diarrhea or loose stools.   LORazepam 0.5 MG tablet Commonly known as:  ATIVAN 1 tablet by mouth 30 minutes before MRI. May repeat x 1 if needed   losartan 100 MG tablet Commonly known as:  COZAAR TAKE 1 TABLET BY MOUTH ONCE DAILY   nitroGLYCERIN 0.4 MG SL tablet Commonly known as:  NITROSTAT Place 1 tablet (0.4 mg total) under the tongue every 5 (five) minutes as needed for chest pain.   ONETOUCH VERIO test strip Generic drug:  glucose blood Use to test blood sugar 3 times daily.   Pancrelipase (Lip-Prot-Amyl) 24000-76000 units Cpep Take 1 capsule (24,000 Units total) by mouth 3 (three) times daily before meals.   promethazine 25 MG tablet Commonly known as:  PHENERGAN Take 0.5 tablets (12.5 mg total) by mouth every 6 (six) hours as needed for nausea or vomiting.   rosuvastatin 20 MG tablet Commonly known as:  CRESTOR TAKE 1 TABLET(20 MG) BY MOUTH DAILY  ruxolitinib phosphate 25 MG tablet Commonly known as:  JAKAFI Take 1 tablet (25 mg total) by mouth 2 (two) times daily.       Allergies:  Allergies  Allergen Reactions  . Lisinopril Cough  . Tape Hives  . Doxycycline Hives, Swelling and Rash  . Latex Hives, Itching and Rash    Past Medical History, Surgical history, Social history, and Family History were reviewed and updated.  Review of Systems: As stated in the interim history  Physical Exam:  weight is 224 lb (101.6 kg). Her oral temperature is 97.9 F (36.6 C). Her blood pressure is 156/60 (abnormal) and her pulse is 89. Her respiration is 20 and oxygen saturation is 95%.   Wt Readings from Last 3 Encounters:    08/04/17 224 lb (101.6 kg)  07/26/17 225 lb (102.1 kg)  06/21/17 223 lb (101.2 kg)    Physical Exam  Constitutional: She is oriented to person, place, and time. She appears well-developed.  ,but moderately obese  HENT:  Head: Normocephalic and atraumatic.  Mouth/Throat: Oropharynx is clear and moist.  Eyes: Pupils are equal, round, and reactive to light. EOM are normal.  Neck: Normal range of motion.  Cardiovascular: Normal rate, regular rhythm and normal heart sounds.   Pulmonary/Chest: Effort normal and breath sounds normal.  Abdominal:  Moderately obese but soft. She has good bowel sounds. There is no palpable liver or spleen tip.  Musculoskeletal: Normal range of motion. She exhibits no edema, tenderness or deformity.  Lymphadenopathy:    She has no cervical adenopathy.  She has no axillary adenopathy. There is no supraclavicular or inguinal adenopathy.  Neurological: She is alert and oriented to person, place, and time.  Skin: Skin is warm and dry. No rash noted. No erythema.  Psychiatric: She has a normal mood and affect. Her behavior is normal. Judgment and thought content normal.  Vitals reviewed.    Lab Results  Component Value Date   WBC 16.7 (H) 08/04/2017   HGB 12.1 08/04/2017   HCT 36.7 08/04/2017   MCV 90 08/04/2017   PLT 420 (H) 08/04/2017   Lab Results  Component Value Date   FERRITIN 266 06/21/2017   IRON 78 06/21/2017   TIBC 258 06/21/2017   UIBC 179 06/21/2017   IRONPCTSAT 30 06/21/2017   Lab Results  Component Value Date   RETICCTPCT 2.4 01/30/2014   RBC 4.07 08/04/2017   No results found for: [facility] Lab Results  Component Value Date   IGA 233 03/29/2014   No results found for: Odetta Pink, SPEI   Chemistry      Component Value Date/Time   NA 137 08/04/2017 0900   NA 138 01/21/2017 0806   K 3.8 08/04/2017 0900   K 4.1 01/21/2017 0806   CL 104  08/04/2017 0900   CL 103 02/16/2013 0900   CO2 24 08/04/2017 0900   CO2 21 (L) 01/21/2017 0806   BUN 16 08/04/2017 0900   BUN 12.2 01/21/2017 0806   CREATININE 1.1 08/04/2017 0900   CREATININE 1.0 01/21/2017 0806      Component Value Date/Time   CALCIUM 9.1 08/04/2017 0900   CALCIUM 9.3 01/21/2017 0806   ALKPHOS 104 (H) 08/04/2017 0900   ALKPHOS 112 01/21/2017 0806   AST 36 08/04/2017 0900   AST 37 (H) 01/21/2017 0806   ALT 26 08/04/2017 0900   ALT 16 01/21/2017 0806   BILITOT 1.00 08/04/2017 0900   BILITOT 0.89 01/21/2017  0806      Impression and Plan: Ms. Bratcher is a very pleasant 76 yo caucasian female with polycythemia vera, JAK2 positive. She is taking her Jakafi BID as prescribed.   For right now, we will not have to phlebotomize her.  I will plan to get her back in 4 weeks. We will have to see what her blood sugars are.  As always, she and her husband are a lot of fun to talk to.   Volanda Napoleon, MD 10/18/201810:36 AM

## 2017-08-05 ENCOUNTER — Other Ambulatory Visit: Payer: Self-pay

## 2017-08-05 MED ORDER — ONETOUCH VERIO VI STRP: ORAL_STRIP | 2 refills | Status: DC

## 2017-08-08 ENCOUNTER — Encounter: Payer: Self-pay | Admitting: Internal Medicine

## 2017-08-08 ENCOUNTER — Ambulatory Visit (INDEPENDENT_AMBULATORY_CARE_PROVIDER_SITE_OTHER): Payer: Medicare Other | Admitting: Internal Medicine

## 2017-08-08 VITALS — BP 130/80 | HR 81 | Wt 222.0 lb

## 2017-08-08 DIAGNOSIS — Z794 Long term (current) use of insulin: Secondary | ICD-10-CM

## 2017-08-08 DIAGNOSIS — E1142 Type 2 diabetes mellitus with diabetic polyneuropathy: Secondary | ICD-10-CM | POA: Diagnosis not present

## 2017-08-08 MED ORDER — INSULIN REGULAR HUMAN (CONC) 500 UNIT/ML ~~LOC~~ SOPN: PEN_INJECTOR | SUBCUTANEOUS | 1 refills | Status: DC

## 2017-08-08 NOTE — Progress Notes (Addendum)
Patient ID: Amanda [name], female   DOB: 1941/03/22, 76 y.o.   MRN: [id number]  HPI: Amanda [name] is a 76 y.o.-year-old female, returning for f/u for DM2, dx 2008, insulin-dependent since dx, uncontrolled, with complications (DR, PN, CAD). Last visit 3 mo ago. She is here with her husband who offers part of the hx, especially related to CBG readings and insulin doses.  She has PVera (Dr. [name])  Last hemoglobin A1c was: 05/06/2017: HbA1c calculated from the fructosamine appears better, at 6.7%. 02/11/2016: HbA1c from  fructosamine is 7.5% Lab Results  Component Value Date   HGBA1C 9.6 10/28/2015   HGBA1C 8.8 (H) 01/27/2015   HGBA1C 9.8 (H) 05/16/2014  07/01/2015: HbA1c from fructosamine 6.57%. 01/27/2015: HbA1c from fructosamine: 6.57% 05/16/2014: HbA1c from fructosamine: 7.95%. - in 10/16/2012, hemoglobin A1c was 6.9% - in 07/2012, hemoglobin A1c was 9.9% - in 11/16/2010, hemoglobin A1c was 12.8% - in 03/2011, hemoglobin A1c was 6.9% - In 07/10/2010 her hemoglobin A1c was 10.5% C-peptide was 6.3 and 01/2011.  On U500 pens: - 100 units before b'fast (may be 7 am to 11 am)  >> 85 units in am  - 85 units before dinner >> 100 units before dinner - she misses many doses - - added 10/2016 >> not using - Restarted Invokana 100 mg daily - 04/2017 - ran out 2 weeks ago >> just got it back Stopped Invokana 100 mg in the past b/c of negative publicity.  She could not tolerate doses of metformin XR higher than 250 mg twice a day in the past due to diarrhea. She does not want to resume this. Was previously on the Victoza 1.8 mg daily - "made me sick". She was previously on Janumet between 2012-2013.  She checks sugars 2x a day: - am:70s, 120-150, 200s with Christmas >> 200-250, 365 >> 233, 291 - 2h after b'fast: n/c >> 238 >> n/c >> 167 >> n/c - before lunch: 128-235, 418, 499 >> 69, 90-198, 250 >> n/c - 2h after lunch:  171-175 >> 130-150s, 280 (Thanksgiving) >> n/c >> 379, 406 -  before dinner:  162-386, 592 >> 75-192 >> 120-140s >> 170-210 >> 241 - bedtime:  164, 240, 240, 268 >> 273 >> n/c >> 180-220 >> n/c >> 200-305 No lows. Lowest sugar was 69x1>> 70s >> 23 in the hospital (?!) >> 200; she has hypoglycemia awareness at ~100.  Highest 400s when sick >> 280 >> 365 >> 452 (recent labs).  She was seen for nutritional advice and diabetes education, with last visit on 04/2012 - [name]..  - no h/o CKD, last BUN/creatinine: (Glu 452!) Lab Results  Component Value Date   BUN 16 08/04/2017   CREATININE 1.1 08/04/2017  On Cozaar.  Latest Lipid panel: Lab Results  Component Value Date   CHOL 207 (H) 02/14/2017   HDL 34.00 (L) 02/14/2017   LDLCALC 88 01/27/2015   LDLDIRECT 103.0 02/14/2017   TRIG 243.0 (H) 02/14/2017   CHOLHDL 6 02/14/2017  On Pravastatin.. - last eye exam was in 04/2017. 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 On last eye exam: no DR. She has cataracts.  - she has peripheral neuropathy >> saw PCP, podiatrist, orthopedist >> tried capsaicin cream, compression stockings, now on Neurontin On ASA 81.  She also has a history of hypertension, diastolic dysfunction, hyperlipidemia, polycytemia vera, gout, psoriasis, depression, history of Bell's palsy, obesity, vitamin D deficiency.  On 01/27/2015 she had an AMI >> had 2  stents placed.  ROS: Constitutional: no weight gain/no weight loss, no fatigue, no subjective hyperthermia, no subjective hypothermia Eyes: no blurry vision, no xerophthalmia ENT: no sore throat, no nodules palpated in throat, no dysphagia, no odynophagia, no hoarseness Cardiovascular: no CP/no SOB/no palpitations/no leg swelling Respiratory: no cough/no SOB/no wheezing Gastrointestinal: no N/no V/no D/no C/no acid reflux Musculoskeletal: no muscle aches/no joint aches Skin: no rashes, no hair loss Neurological: no tremors/no numbness/no tingling/no dizziness  I reviewed pt's medications,  allergies, PMH, social hx, family hx, and changes were documented in the history of present illness. Otherwise, unchanged from my initial visit note.  PE: BP 130/80 (BP Location: Left Arm, Patient Position: Sitting)   Pulse 81   Wt 222 lb (100.7 kg)   SpO2 95%   BMI 37.52 kg/m  Body mass index is 37.52 kg/m. Wt Readings from Last 3 Encounters:  08/08/17 222 lb (100.7 kg)  08/04/17 224 lb (101.6 kg)  07/26/17 225 lb (102.1 kg)   Constitutional: overweight, in NAD Eyes: PERRLA, EOMI, no exophthalmos ENT: moist mucous membranes, no thyromegaly, no cervical lymphadenopathy Cardiovascular: RRR, No MRG Respiratory: CTA B Gastrointestinal: abdomen soft, NT, ND, BS+ Musculoskeletal: no deformities, strength intact in all 4 Skin: moist, warm, no rashes Neurological: no tremor with outstretched hands, DTR normal in all 4  ASSESSMENT: 1. 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 Diabetic peripheral neuropathy  PLAN:  1. Patient with long-standing, uncontrolled DM2, very insulin resistant, on U500 insulin regime + Invokana added at last visit. Unfortunately, she misses many U500 doses and was also out of Invokana for 2 weeks. Sugars are quite high: 200-400. Strongly advised to get back on track with the insulin doses and take the pens with her and also not to run out of Bent. She feels sugars are high even when she is taking her doses as Rx'ed >> will increase am insulin dose. If this is not enough and sugars remain high >> discussed to split the dose in 3 doses, before each meal. - I advised her to: Patient Instructions  Please continue: - Invokana 100 mg daily  Please increase: - U500 insulin to  100 units before b'fast 100 units before dinner  If sugars do not improve after the dose increase, may need to change to: 85 units before b'fast 25 units before lunch 100 units before dinner  Please ask Dr [name] to  also order a fructosamine and send me the results.  Please return in 3 months with your sugar log.   - will check a fructosamine, however, she would like to have this checked at next lab visit for Dr. [name] - ordered for then.  HbA1c is not accurate for her due to the PE vera and should not be checked. - continue checking sugars at different times of the day - check 3x a day, rotating checks - advised for yearly eye exams >> she is UTD - got the flu shot this season - Return to clinic in 3 mo with sugar log   2. Diabetic peripheral neuropathy - continues Neurontin 600 mg 2x a day  - also on neuropathy cream: mix Amitriptyline 2%, Lidocaine 2%, and Ketamine 1% - takes this prn - stable   [name], MD PhD [name]

## 2017-08-08 NOTE — Patient Instructions (Addendum)
Please continue: - Invokana 100 mg daily  Please increase: - U500 insulin to  100 units before b'fast 100 units before dinner  If sugars do not improve after the dose increase, may need to change to: 85 units before b'fast 25 units before lunch 100 units before dinner  Please ask Dr [name] to also order a fructosamine and send me the results.  Please return in 3 months with your sugar log.

## 2017-08-28 ENCOUNTER — Other Ambulatory Visit: Payer: Self-pay | Admitting: Family

## 2017-08-28 DIAGNOSIS — I10 Essential (primary) hypertension: Secondary | ICD-10-CM

## 2017-08-30 ENCOUNTER — Other Ambulatory Visit (HOSPITAL_BASED_OUTPATIENT_CLINIC_OR_DEPARTMENT_OTHER): Payer: Medicare Other

## 2017-08-30 ENCOUNTER — Encounter: Payer: Self-pay | Admitting: Family

## 2017-08-30 ENCOUNTER — Other Ambulatory Visit: Payer: Self-pay

## 2017-08-30 ENCOUNTER — Ambulatory Visit (HOSPITAL_BASED_OUTPATIENT_CLINIC_OR_DEPARTMENT_OTHER): Payer: Medicare Other | Admitting: Family

## 2017-08-30 VITALS — BP 158/62 | HR 79 | Temp 97.9°F | Resp 19 | Wt 218.0 lb

## 2017-08-30 DIAGNOSIS — D508 Other iron deficiency anemias: Secondary | ICD-10-CM

## 2017-08-30 DIAGNOSIS — E1142 Type 2 diabetes mellitus with diabetic polyneuropathy: Secondary | ICD-10-CM

## 2017-08-30 DIAGNOSIS — D45 Polycythemia vera: Secondary | ICD-10-CM

## 2017-08-30 LAB — MANUAL DIFFERENTIAL (CHCC SATELLITE)
ALC: 1.6 10*3/uL (ref 0.6–2.2)
ANC (CHCC HP manual diff): 17 10*3/uL — ABNORMAL HIGH (ref 1.5–6.7)
BAND NEUTROPHILS: 4 % (ref 0–10)
Eos: 2 % (ref 0–7)
LYMPH: 8 % — AB (ref 14–48)
METAMYELOCYTES PCT: 1 % — AB (ref 0–0)
MONO: 4 % (ref 0–13)
PLT EST ~~LOC~~: ADEQUATE
SEG: 81 % — ABNORMAL HIGH (ref 40–75)

## 2017-08-30 LAB — CMP (CANCER CENTER ONLY)
ALBUMIN: 3.8 g/dL (ref 3.3–5.5)
ALT(SGPT): 24 U/L (ref 10–47)
AST: 38 U/L (ref 11–38)
Alkaline Phosphatase: 119 U/L — ABNORMAL HIGH (ref 26–84)
BUN, Bld: 13 mg/dL (ref 7–22)
CALCIUM: 9.1 mg/dL (ref 8.0–10.3)
CHLORIDE: 107 meq/L (ref 98–108)
CO2: 27 mEq/L (ref 18–33)
Creat: 1.1 mg/dl (ref 0.6–1.2)
Glucose, Bld: 288 mg/dL — ABNORMAL HIGH (ref 73–118)
POTASSIUM: 3.9 meq/L (ref 3.3–4.7)
Sodium: 142 mEq/L (ref 128–145)
TOTAL PROTEIN: 6.7 g/dL (ref 6.4–8.1)
Total Bilirubin: 0.9 mg/dl (ref 0.20–1.60)

## 2017-08-30 LAB — FERRITIN: FERRITIN: 150 ng/mL (ref 9–269)

## 2017-08-30 LAB — IRON AND TIBC
%SAT: 26 % (ref 21–57)
IRON: 73 ug/dL (ref 41–142)
TIBC: 279 ug/dL (ref 236–444)
UIBC: 206 ug/dL (ref 120–384)

## 2017-08-30 LAB — CBC WITH DIFFERENTIAL (CANCER CENTER ONLY)
HEMATOCRIT: 39.8 % (ref 34.8–46.6)
HEMOGLOBIN: 12.9 g/dL (ref 11.6–15.9)
MCH: 29 pg (ref 26.0–34.0)
MCHC: 32.4 g/dL (ref 32.0–36.0)
MCV: 89 fL (ref 81–101)
Platelets: 432 10*3/uL — ABNORMAL HIGH (ref 145–400)
RBC: 4.45 10*6/uL (ref 3.70–5.32)
RDW: 17.2 % — ABNORMAL HIGH (ref 11.1–15.7)
WBC: 19.8 10*3/uL — AB (ref 3.9–10.0)

## 2017-08-30 NOTE — Progress Notes (Signed)
Hematology and Oncology Follow Up Visit  Amanda [name] [id number] 1941-07-11 76 y.o. 08/30/2017   Principle Diagnosis:  [name] - JAK2 (+)  Current Therapy:   Jakafi 4m PO BID   Interim History:  Ms. Amanda [name] here today with her husband for follow-up. Today is her birthday so we sang to her! She states that she is doing fairly well but her blood sugars are still not well controlled.  She has what appears to be acne on her nose and cheeks. She has had this for several weeks and feels it may be due to the eye drops she had been using but has since finished. She has tried using neosporin with not a lot of improvement. I advised she try hydrocortisone cream. I also gave her the contact information for Dr. FJosph [name] office for further work up.  Her WBC count is 19.8 with a platelet count is 432. Hgb is stable at 12.9.  She states that she is taking her Jakafi 25 mg PO BID.  She denies fever, chills, n/v, cough, rash, dizziness, SOB, chest pain, palpitations, abdominal pain or changes in bowel or bladder habits.  She is stressed about their up coming move to a third floor apartment and finances. She dreads the packing as well.  She has had no tenderness, numbness or tingling in her extremities. She has occasional puffiness in her left ankle due to an old injury. No swelling in her extremities at this time.  She has maintained a good appetite and is staying well hydrated. Her weight is stable.   ECOG Performance Status: 1 - Symptomatic but completely ambulatory  Medications:  Allergies as of 08/30/2017      Reactions   Lisinopril Cough   Tape Hives   Doxycycline Hives, Swelling, Rash   Latex Hives, Itching, Rash      Medication List        Accurate as of 08/30/17 11:24 AM. Always use your most recent med list.          amLODipine 5 MG tablet Commonly known as:  NORVASC Take 1 tablet (5 mg total) by mouth daily.   aspirin EC 81 MG tablet Take 81 mg by mouth  daily.   canagliflozin 100 MG Tabs tablet Commonly known as:  INVOKANA Take 1 tablet (100 mg total) by mouth daily.   diphenoxylate-atropine 2.5-0.025 MG tablet Commonly known as:  LOMOTIL Take 1-2 tablets by mouth 2 (two) times daily as needed for diarrhea or loose stools.   FLUZONE HIGH-DOSE 0.5 ML injection Generic drug:  Influenza vac split quadrivalent PF ADM 0.5ML IM UTD   Insulin Pen Needle 32G X 4 MM Misc Use to inject insulins.   insulin regular human CONCENTRATED 500 UNIT/ML kwikpen Commonly known as:  HUMULIN R U-500 KWIKPEN Inject 100 units under the skin before breakfast and 100 units before dinner.   loperamide 2 MG tablet Commonly known as:  IMODIUM A-D Take 1-2 tablets (2-4 mg total) by mouth daily as needed for diarrhea or loose stools.   LORazepam 0.5 MG tablet Commonly known as:  ATIVAN 1 tablet by mouth 30 minutes before MRI. May repeat x 1 if needed   losartan 100 MG tablet Commonly known as:  COZAAR TAKE 1 TABLET BY MOUTH ONCE DAILY   nitroGLYCERIN 0.4 MG SL tablet Commonly known as:  NITROSTAT Place 1 tablet (0.4 mg total) under the tongue every 5 (five) minutes as needed for chest pain.   ONETOUCH VERIO test strip Generic  drug:  glucose blood Use to test blood sugar 3 times daily.   Pancrelipase (Lip-Prot-Amyl) 24000-76000 units Cpep Take 1 capsule (24,000 Units total) by mouth 3 (three) times daily before meals.   promethazine 25 MG tablet Commonly known as:  PHENERGAN Take 0.5 tablets (12.5 mg total) by mouth every 6 (six) hours as needed for nausea or vomiting.   rosuvastatin 20 MG tablet Commonly known as:  CRESTOR TAKE 1 TABLET(20 MG) BY MOUTH DAILY   ruxolitinib phosphate 25 MG tablet Commonly known as:  JAKAFI Take 1 tablet (25 mg total) by mouth 2 (two) times daily.       Allergies:  Allergies  Allergen Reactions  . Lisinopril Cough  . Tape Hives  . Doxycycline Hives, Swelling and Rash  . Latex Hives, Itching and Rash     Past Medical History, Surgical history, Social history, and Family History were reviewed and updated.  Review of Systems: All other 10 point review of systems is negative.   Physical Exam:  vitals were not taken for this visit.   Wt Readings from Last 3 Encounters:  08/08/17 222 lb (100.7 kg)  08/04/17 224 lb (101.6 kg)  07/26/17 225 lb (102.1 kg)    Ocular: Sclerae unicteric, pupils equal, round and reactive to light Ear-nose-throat: Oropharynx clear, dentition fair Lymphatic: No cervical, supraclavicular or axillary adenopathy Lungs no rales or rhonchi, good excursion bilaterally Heart regular rate and rhythm, no murmur appreciated Abd soft, nontender, positive bowel sounds, no liver or spleen tip palpated on exam, no fluid wave  MSK no focal spinal tenderness, no joint edema Neuro: non-focal, well-oriented, appropriate affect Breasts: Deferred   Lab Results  Component Value Date   WBC 16.7 (H) 08/04/2017   HGB 12.1 08/04/2017   HCT 36.7 08/04/2017   MCV 90 08/04/2017   PLT 420 (H) 08/04/2017   Lab Results  Component Value Date   FERRITIN 184 08/04/2017   IRON 59 08/04/2017   TIBC 260 08/04/2017   UIBC 202 08/04/2017   IRONPCTSAT 23 08/04/2017   Lab Results  Component Value Date   RETICCTPCT 2.4 01/30/2014   RBC 4.07 08/04/2017   No results found for: Nils Pyle [facility] Lab Results  Component Value Date   IGA 233 03/29/2014   No results found for: Kathrynn Ducking, MSPIKE, SPEI   Chemistry      Component Value Date/Time   NA 137 08/04/2017 0900   NA 138 01/21/2017 0806   K 3.8 08/04/2017 0900   K 4.1 01/21/2017 0806   CL 104 08/04/2017 0900   CL 103 02/16/2013 0900   CO2 24 08/04/2017 0900   CO2 21 (L) 01/21/2017 0806   BUN 16 08/04/2017 0900   BUN 12.2 01/21/2017 0806   CREATININE 1.1 08/04/2017 0900   CREATININE 1.0 01/21/2017 0806      Component Value Date/Time   CALCIUM 9.1  08/04/2017 0900   CALCIUM 9.3 01/21/2017 0806   ALKPHOS 104 (H) 08/04/2017 0900   ALKPHOS 112 01/21/2017 0806   AST 36 08/04/2017 0900   AST 37 (H) 01/21/2017 0806   ALT 26 08/04/2017 0900   ALT 16 01/21/2017 0806   BILITOT 1.00 08/04/2017 0900   BILITOT 0.89 01/21/2017 0806      Impression and Plan: Ms. Amanda [name] is a very pleasant 76 yo caucasian female with polycythemia vera, JAK2 positive. She is doing well on Jakafi 25 mg PO BID and her counts are remaining stable. I spoke with Dr.  Ennever and we will keep her on this same regimen.  We will continue to hold off on phlebotomizing her for now.  We will continue to follow along with her and plan to see her back again in another 6 weeks for follow-up and repeat labs.  She will contact our office with any questions or concerns. We can certainly see her sooner if need be.   Eliezer Bottom, NP 11/13/201811:24 AM

## 2017-08-31 LAB — FRUCTOSAMINE: Fructosamine: 359 umol/L — ABNORMAL HIGH (ref 0–285)

## 2017-09-01 ENCOUNTER — Other Ambulatory Visit: Payer: Medicare Other

## 2017-09-01 ENCOUNTER — Ambulatory Visit: Payer: Medicare Other | Admitting: Family

## 2017-09-16 ENCOUNTER — Telehealth: Payer: Self-pay | Admitting: Internal Medicine

## 2017-09-16 NOTE — Telephone Encounter (Signed)
[employer] and are having the form faxed over to do the refill.

## 2017-09-16 NOTE — Telephone Encounter (Signed)
Amanda [name] from [facility] asked you to send new prescription  For the humulin R U 500,  Used with Humulin R U 500 syringes quick pen 100 units 2 x a day. Power

## 2017-09-20 NOTE — Telephone Encounter (Signed)
Pt called we faxed over a prescription, pharmacy didn't receive it for insulin . Pt wants to know the day it was faxed, number it was faxed to, and how many pages were faxed over  Please advise  [phone]

## 2017-09-20 NOTE — Telephone Encounter (Signed)
Information was left for the husband on his VM per his request so that he can track the fax

## 2017-10-12 ENCOUNTER — Encounter: Payer: Self-pay | Admitting: Family

## 2017-10-12 ENCOUNTER — Other Ambulatory Visit: Payer: Self-pay

## 2017-10-12 ENCOUNTER — Ambulatory Visit (HOSPITAL_BASED_OUTPATIENT_CLINIC_OR_DEPARTMENT_OTHER): Payer: Medicare Other | Admitting: Family

## 2017-10-12 ENCOUNTER — Other Ambulatory Visit (HOSPITAL_BASED_OUTPATIENT_CLINIC_OR_DEPARTMENT_OTHER): Payer: Medicare Other

## 2017-10-12 VITALS — BP 165/55 | HR 88 | Temp 98.3°F | Resp 22 | Wt 225.0 lb

## 2017-10-12 DIAGNOSIS — D508 Other iron deficiency anemias: Secondary | ICD-10-CM | POA: Diagnosis not present

## 2017-10-12 DIAGNOSIS — D45 Polycythemia vera: Secondary | ICD-10-CM

## 2017-10-12 LAB — CMP (CANCER CENTER ONLY)
ALBUMIN: 3.4 g/dL (ref 3.3–5.5)
ALT(SGPT): 25 U/L (ref 10–47)
AST: 29 U/L (ref 11–38)
Alkaline Phosphatase: 97 U/L — ABNORMAL HIGH (ref 26–84)
BUN, Bld: 10 mg/dL (ref 7–22)
CHLORIDE: 103 meq/L (ref 98–108)
CO2: 25 meq/L (ref 18–33)
CREATININE: 0.9 mg/dL (ref 0.6–1.2)
Calcium: 8.7 mg/dL (ref 8.0–10.3)
Glucose, Bld: 424 mg/dL — ABNORMAL HIGH (ref 73–118)
Potassium: 3.7 mEq/L (ref 3.3–4.7)
SODIUM: 140 meq/L (ref 128–145)
Total Bilirubin: 1 mg/dl (ref 0.20–1.60)
Total Protein: 6.2 g/dL — ABNORMAL LOW (ref 6.4–8.1)

## 2017-10-12 LAB — MANUAL DIFFERENTIAL (CHCC SATELLITE)
ALC: 1.3 10*3/uL (ref 0.6–2.2)
ANC (CHCC MAN DIFF): 18.7 10*3/uL — AB (ref 1.5–6.7)
BASO: 0 % (ref 0–2)
Band Neutrophils: 3 % (ref 0–10)
Eos: 2 % (ref 0–7)
LYMPH: 6 % — AB (ref 14–48)
METAMYELOCYTES PCT: 1 % — AB (ref 0–0)
MONO: 3 % (ref 0–13)
PLT EST ~~LOC~~: ADEQUATE
SEG: 85 % — ABNORMAL HIGH (ref 40–75)

## 2017-10-12 LAB — CBC WITH DIFFERENTIAL (CANCER CENTER ONLY)
HCT: 37.3 % (ref 34.8–46.6)
HEMOGLOBIN: 12.5 g/dL (ref 11.6–15.9)
MCH: 29.3 pg (ref 26.0–34.0)
MCHC: 33.5 g/dL (ref 32.0–36.0)
MCV: 88 fL (ref 81–101)
Platelets: 449 10*3/uL — ABNORMAL HIGH (ref 145–400)
RBC: 4.26 10*6/uL (ref 3.70–5.32)
RDW: 17.2 % — AB (ref 11.1–15.7)
WBC: 21.1 10*3/uL — ABNORMAL HIGH (ref 3.9–10.0)

## 2017-10-12 LAB — IRON AND TIBC
%SAT: 20 % — AB (ref 21–57)
IRON: 55 ug/dL (ref 41–142)
TIBC: 275 ug/dL (ref 236–444)
UIBC: 220 ug/dL (ref 120–384)

## 2017-10-12 LAB — FERRITIN: Ferritin: 104 ng/ml (ref 9–269)

## 2017-10-12 NOTE — Progress Notes (Signed)
Hematology and Oncology Follow Up Visit  Amanda [name] [id number] December 28, 1940 76 y.o. 10/12/2017   Principle Diagnosis:  [name] - JAK2 (+)  Current Therapy:   Jakafi 1m PO BID   Interim History:  Amanda [name] here today with her husband for follow-up. She is feeling fatigued and states that she is sleeping during the day and up all night. She is stress about packing and moving to a different apartment in February.  She verbalized that she is taking her Jakafi 25 mg PO BID as prescribed.  No fever, chills, n/v, cough, rash, dizziness, palpitations, abdominal pain or changes in bowel or bladder habits.  She has occasional chest discomfort when she is lying down. She has nitro tablets but states that she has not had to use them.  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  Her SOB with over exertion is unchanged.  No bleeding, bruising or petechiae. No lymphadenopathy found on exam.  She has some puffiness in her hands and ankles that comes and goes. The intermittent numbness and tingling in her hands is the same.   Her blood sugar today is 424. She did not take her insulin this morning and plans to take it when she gets home.  She has a good appetite and is staying well hydrated. Her weight is stable.   ECOG Performance Status: 1 - Symptomatic but completely ambulatory  Medications:  Allergies as of 10/12/2017      Reactions   Lisinopril Cough   Tape Hives   Doxycycline Hives, Swelling, Rash   Latex Hives, Itching, Rash      Medication List        Accurate as of 10/12/17 10:50 AM. Always use your most recent med list.          amLODipine 5 MG tablet Commonly known as:  NORVASC Take 1 tablet (5 mg total) by mouth daily.   aspirin EC 81 MG tablet Take 81 mg by mouth daily.   canagliflozin 100 MG Tabs tablet Commonly known as:  INVOKANA Take 1 tablet (100 mg total) by mouth daily.   diphenoxylate-atropine 2.5-0.025 MG tablet Commonly known as:   LOMOTIL Take 1-2 tablets by mouth 2 (two) times daily as needed for diarrhea or loose stools.   FLUZONE HIGH-DOSE 0.5 ML injection Generic drug:  Influenza vac split quadrivalent PF ADM 0.5ML IM UTD   Insulin Pen Needle 32G X 4 MM Misc Use to inject insulins.   insulin regular human CONCENTRATED 500 UNIT/ML kwikpen Commonly known as:  HUMULIN R U-500 KWIKPEN Inject 100 units under the skin before breakfast and 100 units before dinner.   loperamide 2 MG tablet Commonly known as:  IMODIUM A-D Take 1-2 tablets (2-4 mg total) by mouth daily as needed for diarrhea or loose stools.   LORazepam 0.5 MG tablet Commonly known as:  ATIVAN 1 tablet by mouth 30 minutes before MRI. May repeat x 1 if needed   losartan 100 MG tablet Commonly known as:  COZAAR TAKE 1 TABLET BY MOUTH ONCE DAILY   nitroGLYCERIN 0.4 MG SL tablet Commonly known as:  NITROSTAT Place 1 tablet (0.4 mg total) under the tongue every 5 (five) minutes as needed for chest pain.   ONETOUCH VERIO test strip Generic drug:  glucose blood Use to test blood sugar 3 times daily.   Pancrelipase (Lip-Prot-Amyl) 24000-76000 units Cpep Take 1 capsule (24,000 Units total) by mouth 3 (three) times daily before meals.   promethazine 25 MG  tablet Commonly known as:  PHENERGAN Take 0.5 tablets (12.5 mg total) by mouth every 6 (six) hours as needed for nausea or vomiting.   rosuvastatin 20 MG tablet Commonly known as:  CRESTOR TAKE 1 TABLET(20 MG) BY MOUTH DAILY   ruxolitinib phosphate 25 MG tablet Commonly known as:  JAKAFI Take 1 tablet (25 mg total) by mouth 2 (two) times daily.       Allergies:  Allergies  Allergen Reactions  . Lisinopril Cough  . Tape Hives  . Doxycycline Hives, Swelling and Rash  . Latex Hives, Itching and Rash    Past Medical History, Surgical history, Social history, and Family History were reviewed and updated.  Review of Systems: All other 10 point review of systems is negative.    Physical Exam:  vitals were not taken for this visit.   Wt Readings from Last 3 Encounters:  08/30/17 218 lb (98.9 kg)  08/08/17 222 lb (100.7 kg)  08/04/17 224 lb (101.6 kg)    Ocular: Sclerae unicteric, pupils equal, round and reactive to light Ear-nose-throat: Oropharynx clear, dentition fair Lymphatic: No cervical, supraclavicular or axillary adenopathy Lungs no rales or rhonchi, good excursion bilaterally Heart regular rate and rhythm, no murmur appreciated Abd soft, nontender, positive bowel sounds, no liver or spleen tip palpated on exam, no fluid wave  MSK no focal spinal tenderness, no joint edema Neuro: non-focal, well-oriented, appropriate affect Breasts: Deferred   Lab Results  Component Value Date   WBC 21.1 (H) 10/12/2017   HGB 12.5 10/12/2017   HCT 37.3 10/12/2017   MCV 88 10/12/2017   PLT 449 (H) 10/12/2017   Lab Results  Component Value Date   FERRITIN 150 08/30/2017   IRON 73 08/30/2017   TIBC 279 08/30/2017   UIBC 206 08/30/2017   IRONPCTSAT 26 08/30/2017   Lab Results  Component Value Date   RETICCTPCT 2.4 01/30/2014   RBC 4.26 10/12/2017   No results found for: Nils Pyle [facility] Lab Results  Component Value Date   IGA 233 03/29/2014   No results found for: Odetta Pink, SPEI   Chemistry      Component Value Date/Time   NA 142 08/30/2017 1104   NA 138 01/21/2017 0806   K 3.9 08/30/2017 1104   K 4.1 01/21/2017 0806   CL 107 08/30/2017 1104   CL 103 02/16/2013 0900   CO2 27 08/30/2017 1104   CO2 21 (L) 01/21/2017 0806   BUN 13 08/30/2017 1104   BUN 12.2 01/21/2017 0806   CREATININE 1.1 08/30/2017 1104   CREATININE 1.0 01/21/2017 0806      Component Value Date/Time   CALCIUM 9.1 08/30/2017 1104   CALCIUM 9.3 01/21/2017 0806   ALKPHOS 119 (H) 08/30/2017 1104   ALKPHOS 112 01/21/2017 0806   AST 38 08/30/2017 1104   AST 37 (H) 01/21/2017 0806   ALT 24  08/30/2017 1104   ALT 16 01/21/2017 0806   BILITOT 0.90 08/30/2017 1104   BILITOT 0.89 01/21/2017 0806      Impression and Plan: Amanda [name] is a very pleasant 76 yo caucasian female with polycythemia vera, JAK-2 positive. She continues to do well on Jakafi 25 mg PO BID and will continue this same regimen.  WBC and platelet count are stable. No anemia.  We will plan to see her back again in another 2 months for follow-up and lab work.  She will contact our office with any questions or concerns. We can certainly  see her sooner if need be.    Laverna Peace, NP 12/26/201810:50 AM

## 2017-10-14 ENCOUNTER — Telehealth: Payer: Self-pay

## 2017-10-14 NOTE — Telephone Encounter (Signed)
Called and informed pts husband that her [employer] shipment came of insulin.

## 2017-10-16 ENCOUNTER — Other Ambulatory Visit: Payer: Self-pay | Admitting: Cardiovascular Disease

## 2017-10-24 ENCOUNTER — Inpatient Hospital Stay: Payer: PPO | Attending: Hematology & Oncology

## 2017-10-24 VITALS — BP 157/48 | HR 80 | Temp 97.8°F | Resp 16

## 2017-10-24 DIAGNOSIS — E611 Iron deficiency: Secondary | ICD-10-CM | POA: Insufficient documentation

## 2017-10-24 DIAGNOSIS — D508 Other iron deficiency anemias: Secondary | ICD-10-CM

## 2017-10-24 MED ORDER — FERUMOXYTOL INJECTION 510 MG/17 ML
510.0000 mg | Freq: Once | INTRAVENOUS | Status: AC
  Administered 2017-10-24: 510 mg via INTRAVENOUS
  Filled 2017-10-24: qty 17

## 2017-10-24 NOTE — Patient Instructions (Signed)

## 2017-11-02 ENCOUNTER — Telehealth: Payer: Self-pay

## 2017-11-02 NOTE — Telephone Encounter (Signed)
Amanda [name] reached out to let us know they sent a 2019 reenlisting letter to the pt and for [place] to be expecting our copy to be sent when they pt mails it back.

## 2017-11-07 ENCOUNTER — Ambulatory Visit (HOSPITAL_BASED_OUTPATIENT_CLINIC_OR_DEPARTMENT_OTHER)
Admission: RE | Admit: 2017-11-07 | Discharge: 2017-11-07 | Disposition: A | Discharge status: Home / Self Care | Payer: PPO | Source: Ambulatory Visit | Attending: Family Medicine | Admitting: Family Medicine

## 2017-11-07 ENCOUNTER — Encounter: Payer: Self-pay | Admitting: Family Medicine

## 2017-11-07 ENCOUNTER — Ambulatory Visit (INDEPENDENT_AMBULATORY_CARE_PROVIDER_SITE_OTHER): Payer: PPO | Admitting: Family Medicine

## 2017-11-07 VITALS — BP 147/81 | HR 86 | Ht 65.0 in | Wt 216.0 lb

## 2017-11-07 DIAGNOSIS — M25552 Pain in left hip: Secondary | ICD-10-CM | POA: Insufficient documentation

## 2017-11-07 DIAGNOSIS — M5442 Lumbago with sciatica, left side: Secondary | ICD-10-CM | POA: Diagnosis not present

## 2017-11-07 DIAGNOSIS — M545 Low back pain: Secondary | ICD-10-CM | POA: Diagnosis not present

## 2017-11-07 DIAGNOSIS — S79912A Unspecified injury of left hip, initial encounter: Secondary | ICD-10-CM | POA: Diagnosis not present

## 2017-11-07 DIAGNOSIS — M47816 Spondylosis without myelopathy or radiculopathy, lumbar region: Secondary | ICD-10-CM | POA: Diagnosis not present

## 2017-11-07 MED ORDER — OXYCODONE-ACETAMINOPHEN 5-325 MG PO TABS: 1.0000 | ORAL_TABLET | ORAL | 0 refills | Status: DC | PRN

## 2017-11-07 MED ORDER — MELOXICAM 7.5 MG PO TABS
7.5000 mg | ORAL_TABLET | Freq: Every day | ORAL | 2 refills | Status: DC
Start: 2017-11-07 — End: 2018-03-26

## 2017-11-07 NOTE — Patient Instructions (Signed)
You have a hip contusion (hip pointer) from your fall along with lumbar strain, sciatic nerve irritation. Take meloxicam daily with food for pain and inflammation. Oxycodone as needed for severe pain. Ice or heat, whichever feels better at this point 15 minutes at a time 3-4 times a day. Topical medicines can help also (like biofreeze, salon pas, icy hot). Consider physical therapy in the future if not improving. Follow up with me in 2 weeks.

## 2017-11-08 ENCOUNTER — Ambulatory Visit: Payer: Medicare Other | Admitting: Internal Medicine

## 2017-11-08 ENCOUNTER — Encounter: Payer: Self-pay | Admitting: Family Medicine

## 2017-11-08 DIAGNOSIS — S79912D Unspecified injury of left hip, subsequent encounter: Secondary | ICD-10-CM | POA: Insufficient documentation

## 2017-11-08 NOTE — Assessment & Plan Note (Signed)
independently reviewed radiographs and no evidence fracture.  Consistent with hip pointer, lumbar strain with some nerve irritation peripherally.  Meloxicam with oxycodone as needed.  Cane, walker for support.  Ice or heat.  Topical medications.  Consider PT if not improving.  F/u in 2 weeks.

## 2017-11-08 NOTE — Progress Notes (Signed)
PCP: Debbrah Alar, NP  Subjective:   HPI: Patient is a 77 y.o. female here for left hip pain.  Patient reports on 1/17 she was getting into the car when she fell on the driveway directly onto her left hip laterally. Couldn't get up - had to call for help. Pain level 9/10 and sharp lateral left hip radiating down to just above the knee laterally. Took tylenol pm but made her nauseous. Taking tramadol and using icy hot patches which seem to help. No skin changes, numbness. Has not had imaging done.  Past Medical History:  Diagnosis Date  . Allergic rhinitis 01/23/2016  . C. difficile colitis   . CAD (coronary artery disease)    a. s/p STEMI in 01/2015 with 95% LCx stenosis and distal 80% LCx stenosis (DESx2 placed)  . Cancer ([place])    SKIN  . Candidiasis of skin 09/30/2014  . Cirrhosis ([place])   . Depression   . Diabetes mellitus 2008  . Gout   . Herpes simplex   . Hyperglycemia 05/31/2013  . Hyperlipidemia   . Hypertension   . Myocardial infarction ([place])   . 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 Obesity   . Polycythemia    Dr. Elease Hashimoto- [facility]  . Psoriasis     Current Outpatient Medications on File Prior to Visit  Medication Sig Dispense Refill  . amLODipine (NORVASC) 5 MG tablet Take 1 tablet (5 mg total) by mouth daily. 30 tablet 1  . aspirin EC 81 MG tablet Take 81 mg by mouth daily.    . canagliflozin (INVOKANA) 100 MG TABS tablet Take 1 tablet (100 mg total) by mouth daily. 30 tablet 5  . diphenoxylate-atropine (LOMOTIL) 2.5-0.025 MG tablet Take 1-2 tablets by mouth 2 (two) times daily as needed for diarrhea or loose stools. 60 tablet 1  . FLUZONE HIGH-DOSE 0.5 ML injection ADM 0.5ML IM UTD  0  . Insulin Pen Needle 32G X 4 MM MISC Use to inject insulins. 100 each 3  . insulin regular human CONCENTRATED (HUMULIN R U-500 KWIKPEN) 500 UNIT/ML kwikpen Inject 100 units under the skin before breakfast and 100 units before dinner. 18 pen 1  . loperamide  (IMODIUM A-D) 2 MG tablet Take 1-2 tablets (2-4 mg total) by mouth daily as needed for diarrhea or loose stools. 30 tablet 1  . LORazepam (ATIVAN) 0.5 MG tablet 1 tablet by mouth 30 minutes before MRI. May repeat x 1 if needed 2 tablet 0  . losartan (COZAAR) 100 MG tablet TAKE 1 TABLET BY MOUTH ONCE DAILY 90 tablet 1  . nitroGLYCERIN (NITROSTAT) 0.4 MG SL tablet Place 1 tablet (0.4 mg total) under the tongue every 5 (five) minutes as needed for chest pain. (Patient not taking: Reported on 08/08/2017) 25 tablet 3  . ONETOUCH VERIO test strip Use to test blood sugar 3 times daily. 100 each 2  . Pancrelipase, Lip-Prot-Amyl, 24000-76000 units CPEP Take 1 capsule (24,000 Units total) by mouth 3 (three) times daily before meals. 90 capsule 1  . promethazine (PHENERGAN) 25 MG tablet Take 0.5 tablets (12.5 mg total) by mouth every 6 (six) hours as needed for nausea or vomiting. 30 tablet 1  . rosuvastatin (CRESTOR) 20 MG tablet TAKE 1 TABLET(20 MG) BY MOUTH DAILY 90 tablet 3  . ruxolitinib phosphate (JAKAFI) 25 MG tablet Take 1 tablet (25 mg total) by mouth 2 (two) times daily. 60 tablet 11   No current facility-administered medications on file prior to visit.  Past Surgical History:  Procedure Laterality Date  . BREAST SURGERY Left    milk duct  . COLONOSCOPY    . LEFT HEART CATHETERIZATION WITH CORONARY ANGIOGRAM N/A 01/27/2015   Procedure: LEFT HEART CATHETERIZATION WITH CORONARY ANGIOGRAM;  Surgeon: [name], MD;  Location: [facility] CATH LAB;  Service: Cardiovascular;  Laterality: N/A;  . TONSILLECTOMY    . TOTAL ABDOMINAL HYSTERECTOMY W/ BILATERAL SALPINGOOPHORECTOMY     for heavy periods with appendectomy    Allergies  Allergen Reactions  . Lisinopril Cough  . Tape Hives  . Doxycycline Hives, Swelling and Rash  . Latex Hives, Itching and Rash    Social History   Socioeconomic History  . Marital status: Married    Spouse name: Not on file  . Number of children: 2  . Years of  education: 2 yr colle  . Highest education level: Not on file  Social Needs  . Financial resource strain: Not on file  . Food insecurity - worry: Not on file  . Food insecurity - inability: Not on file  . Transportation needs - medical: Not on file  . Transportation needs - non-medical: Not on file  Occupational History  . Occupation: housewife  Tobacco Use  . Smoking status: Former Smoker    Years: 48.00    Types: Cigarettes    Last attempt to quit: 10/18/1986    Years since quitting: 31.0  . Smokeless tobacco: Never Used  Substance and Sexual Activity  . Alcohol use: No    Alcohol/week: 0.0 oz  . Drug use: No  . Sexual activity: Yes    Partners: Male    Birth control/protection: None  Other Topics Concern  . Not on file  Social History Narrative   2 caffeine drink per day.  No regular exercise.     Retired from the [employer]   2 children (Daughter and a son) son has morbid obesity   2 grandchildren   3 great grandchildren    Family History  Problem Relation Age of Onset  . Diabetes Mother   . Heart disease Mother        CAD  . Hyperlipidemia Mother   . Hypertension Mother   . Kidney disease Mother   . Kidney disease Father   . Breast cancer Maternal Aunt   . Heart disease Maternal Grandmother   . Heart disease Other        maternal aunts and uncles    BP (!) 147/81   Pulse 86   Ht 5' 5"  (1.651 m)   Wt 216 lb (98 kg)   BMI 35.94 kg/m   Review of Systems: See HPI above.     Objective:  Physical Exam:  Gen: NAD, comfortable in exam room  Back: No gross deformity, scoliosis. TTP left lumbar paraspinal region.  No midline or bony TTP. FROM. Strength LEs 5/5 all muscle groups.   Negative SLRs. Sensation intact to light touch bilaterally.  Left hip: No deformity. TTP laterally over greater trochanter and proximal IT band.   FROM with negative logroll.  5/5 strength. NVI distally.   Assessment & Plan:  1. Left hip/back pain - independently reviewed  radiographs and no evidence fracture.  Consistent with hip pointer, lumbar strain with some nerve irritation peripherally.  Meloxicam with oxycodone as needed.  Cane, walker for support.  Ice or heat.  Topical medications.  Consider PT if not improving.  F/u in 2 weeks.

## 2017-11-18 ENCOUNTER — Telehealth: Payer: Self-pay

## 2017-11-18 NOTE — Telephone Encounter (Signed)
Requesting transfer from [place] to [place]. Please advise.

## 2017-11-18 NOTE — Telephone Encounter (Signed)
[place]ak[place] with me. Please have her make an establish care visit

## 2017-11-18 NOTE — Telephone Encounter (Signed)
Ok with me 

## 2017-11-18 NOTE — Telephone Encounter (Signed)
Copied from [place] [id number]. Topic: Quick Communication - See Telephone Encounter >> Nov 18, 2017 11:14 AM Antonieta Iba C wrote: CRM for notification. See Telephone encounter for: pt's spouse [name] called in to transition care to [facility] from [place]'. Is this switch okay?   Please assist further. CB: [phone]  11/18/17.

## 2017-11-25 ENCOUNTER — Ambulatory Visit: Payer: PPO | Admitting: Family Medicine

## 2017-11-29 ENCOUNTER — Ambulatory Visit (INDEPENDENT_AMBULATORY_CARE_PROVIDER_SITE_OTHER): Payer: PPO | Admitting: Family Medicine

## 2017-11-29 ENCOUNTER — Encounter: Payer: Self-pay | Admitting: Family Medicine

## 2017-11-29 DIAGNOSIS — S79912D Unspecified injury of left hip, subsequent encounter: Secondary | ICD-10-CM

## 2017-11-29 NOTE — Patient Instructions (Signed)
You have a hip contusion (hip pointer) from your fall along with lumbar strain, sciatic nerve irritation. Try salon pas and/or capsaicin topically. Ice or heat, whichever feels better at this point 15 minutes at a time 3-4 times a day. Do home exercises and stretches as directed - experiment with the ones in the handout, pick 5-6 of them and do at least once a day. When doing stretches hold for 20-30 seconds, repeat 3 times. Strengthening exercises you ideally work your way up to 3 sets of 10 once a day. Follow up with me in 1 month for reevaluation.

## 2017-11-30 ENCOUNTER — Encounter: Payer: Self-pay | Admitting: Family Medicine

## 2017-11-30 NOTE — Progress Notes (Signed)
PCP: Debbrah Alar, NP  Subjective:   HPI: Patient is a 77 y.o. female here for left hip pain.  1/21: Patient reports on 1/17 she was getting into the car when she fell on the driveway directly onto her left hip laterally. Couldn't get up - had to call for help. Pain level 9/10 and sharp lateral left hip radiating down to just above the knee laterally. Took tylenol pm but made her nauseous. Taking tramadol and using icy hot patches which seem to help. No skin changes, numbness. Has not had imaging done.  2/12: Patient reports she's had mild improvement since last visit. She stopped the meloxicam as it was causing her to have diarrhea. She is using a walker and cane. Not sleeping as much. Pain left side of low back and lateral left hip. No bowel/bladder dysfunction.  Past Medical History:  Diagnosis Date  . Allergic rhinitis 01/23/2016  . C. difficile colitis   . CAD (coronary artery disease)    a. s/p STEMI in 01/2015 with 95% LCx stenosis and distal 80% LCx stenosis (DESx2 placed)  . Cancer ([place])    SKIN  . Candidiasis of skin 09/30/2014  . Cirrhosis ([place])   . Depression   . Diabetes mellitus 2008  . Gout   . Herpes simplex   . Hyperglycemia 05/31/2013  . Hyperlipidemia   . Hypertension   . Myocardial infarction ([place])   . 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 Obesity   . Polycythemia    Dr. Elease Hashimoto- [facility]  . Psoriasis     Current Outpatient Medications on File Prior to Visit  Medication Sig Dispense Refill  . amLODipine (NORVASC) 5 MG tablet Take 1 tablet (5 mg total) by mouth daily. 30 tablet 1  . aspirin EC 81 MG tablet Take 81 mg by mouth daily.    . canagliflozin (INVOKANA) 100 MG TABS tablet Take 1 tablet (100 mg total) by mouth daily. 30 tablet 5  . diphenoxylate-atropine (LOMOTIL) 2.5-0.025 MG tablet Take 1-2 tablets by mouth 2 (two) times daily as needed for diarrhea or loose stools. 60 tablet 1  . FLUZONE HIGH-DOSE 0.5 ML injection  ADM 0.5ML IM UTD  0  . Insulin Pen Needle 32G X 4 MM MISC Use to inject insulins. 100 each 3  . insulin regular human CONCENTRATED (HUMULIN R U-500 KWIKPEN) 500 UNIT/ML kwikpen Inject 100 units under the skin before breakfast and 100 units before dinner. 18 pen 1  . loperamide (IMODIUM A-D) 2 MG tablet Take 1-2 tablets (2-4 mg total) by mouth daily as needed for diarrhea or loose stools. 30 tablet 1  . LORazepam (ATIVAN) 0.5 MG tablet 1 tablet by mouth 30 minutes before MRI. May repeat x 1 if needed 2 tablet 0  . losartan (COZAAR) 100 MG tablet TAKE 1 TABLET BY MOUTH ONCE DAILY 90 tablet 1  . meloxicam (MOBIC) 7.5 MG tablet Take 1 tablet (7.5 mg total) by mouth daily. 30 tablet 2  . nitroGLYCERIN (NITROSTAT) 0.4 MG SL tablet Place 1 tablet (0.4 mg total) under the tongue every 5 (five) minutes as needed for chest pain. (Patient not taking: Reported on 08/08/2017) 25 tablet 3  . ONETOUCH VERIO test strip Use to test blood sugar 3 times daily. 100 each 2  . oxyCODONE-acetaminophen (PERCOCET/ROXICET) 5-325 MG tablet Take 1 tablet by mouth every 4 (four) hours as needed for severe pain. 30 tablet 0  . Pancrelipase, Lip-Prot-Amyl, 24000-76000 units CPEP Take 1 capsule (24,000 Units  total) by mouth 3 (three) times daily before meals. 90 capsule 1  . promethazine (PHENERGAN) 25 MG tablet Take 0.5 tablets (12.5 mg total) by mouth every 6 (six) hours as needed for nausea or vomiting. 30 tablet 1  . rosuvastatin (CRESTOR) 20 MG tablet TAKE 1 TABLET(20 MG) BY MOUTH DAILY 90 tablet 3  . ruxolitinib phosphate (JAKAFI) 25 MG tablet Take 1 tablet (25 mg total) by mouth 2 (two) times daily. 60 tablet 11   No current facility-administered medications on file prior to visit.     Past Surgical History:  Procedure Laterality Date  . BREAST SURGERY Left    milk duct  . COLONOSCOPY    . LEFT HEART CATHETERIZATION WITH CORONARY ANGIOGRAM N/A 01/27/2015   Procedure: LEFT HEART CATHETERIZATION WITH CORONARY ANGIOGRAM;   Surgeon: [name], MD;  Location: [facility] CATH LAB;  Service: Cardiovascular;  Laterality: N/A;  . TONSILLECTOMY    . TOTAL ABDOMINAL HYSTERECTOMY W/ BILATERAL SALPINGOOPHORECTOMY     for heavy periods with appendectomy    Allergies  Allergen Reactions  . Lisinopril Cough  . Tape Hives  . Doxycycline Hives, Swelling and Rash  . Latex Hives, Itching and Rash    Social History   Socioeconomic History  . Marital status: Married    Spouse name: Not on file  . Number of children: 2  . Years of education: 2 yr colle  . Highest education level: Not on file  Social Needs  . Financial resource strain: Not on file  . Food insecurity - worry: Not on file  . Food insecurity - inability: Not on file  . Transportation needs - medical: Not on file  . Transportation needs - non-medical: Not on file  Occupational History  . Occupation: housewife  Tobacco Use  . Smoking status: Former Smoker    Years: 48.00    Types: Cigarettes    Last attempt to quit: 10/18/1986    Years since quitting: 31.1  . Smokeless tobacco: Never Used  Substance and Sexual Activity  . Alcohol use: No    Alcohol/week: 0.0 oz  . Drug use: No  . Sexual activity: Yes    Partners: Male    Birth control/protection: None  Other Topics Concern  . Not on file  Social History Narrative   2 caffeine drink per day.  No regular exercise.     Retired from the bank   2 children (Daughter and a son) son has morbid obesity   2 grandchildren   3 great grandchildren    Family History  Problem Relation Age of Onset  . Diabetes Mother   . Heart disease Mother        CAD  . Hyperlipidemia Mother   . Hypertension Mother   . Kidney disease Mother   . Kidney disease Father   . Breast cancer Maternal Aunt   . Heart disease Maternal Grandmother   . Heart disease Other        maternal aunts and uncles    BP (!) 158/83   Pulse 87   Ht 5' 5"  (1.651 m)   Wt 217 lb 3.2 oz (98.5 kg)   BMI 36.14 kg/m   Review of  Systems: See HPI above.     Objective:  Physical Exam:  Gen: NAD, comfortable in exam room in chair.  Back: No gross deformity, scoliosis. TTP L > R paraspinal lumbar region.  No midline or bony TTP. FROM. Strength LEs 5/5 all muscle groups.   2+  MSRs in patellar and achilles tendons, equal bilaterally. Negative SLRs. Sensation intact to light touch bilaterally. Negative logroll bilateral hips   Assessment & Plan:  1. Left hip/back pain - Radiographs of both areas negative for fracture.  Consistent with hip point, lumbar strain with sciatic nerve irritation.  Stick with topical medications as oral medications have been upsetting her GI tract.  Shown home exercises and stretches to do daily.  She will consider physical therapy as well.  F/u in 1 month.  Walker or cane for stability.

## 2017-11-30 NOTE — Assessment & Plan Note (Signed)
Radiographs of both areas negative for fracture.  Consistent with hip point, lumbar strain with sciatic nerve irritation.  Stick with topical medications as oral medications have been upsetting her GI tract.  Shown home exercises and stretches to do daily.  She will consider physical therapy as well.  F/u in 1 month.  Walker or cane for stability.

## 2017-12-06 ENCOUNTER — Ambulatory Visit (INDEPENDENT_AMBULATORY_CARE_PROVIDER_SITE_OTHER): Payer: PPO | Admitting: Adult Health

## 2017-12-06 ENCOUNTER — Encounter: Payer: Self-pay | Admitting: Adult Health

## 2017-12-06 VITALS — BP 166/70 | Temp 98.1°F | Wt 223.0 lb

## 2017-12-06 DIAGNOSIS — D473 Essential (hemorrhagic) thrombocythemia: Secondary | ICD-10-CM

## 2017-12-06 DIAGNOSIS — M25561 Pain in right knee: Secondary | ICD-10-CM | POA: Diagnosis not present

## 2017-12-06 DIAGNOSIS — M25511 Pain in right shoulder: Secondary | ICD-10-CM | POA: Diagnosis not present

## 2017-12-06 DIAGNOSIS — E1142 Type 2 diabetes mellitus with diabetic polyneuropathy: Secondary | ICD-10-CM | POA: Diagnosis not present

## 2017-12-06 DIAGNOSIS — Z7689 Persons encountering health services in other specified circumstances: Secondary | ICD-10-CM | POA: Diagnosis not present

## 2017-12-06 DIAGNOSIS — K7460 Unspecified cirrhosis of liver: Secondary | ICD-10-CM

## 2017-12-06 DIAGNOSIS — Z794 Long term (current) use of insulin: Secondary | ICD-10-CM

## 2017-12-06 DIAGNOSIS — E669 Obesity, unspecified: Secondary | ICD-10-CM

## 2017-12-06 DIAGNOSIS — R188 Other ascites: Secondary | ICD-10-CM

## 2017-12-06 DIAGNOSIS — I2510 Atherosclerotic heart disease of native coronary artery without angina pectoris: Secondary | ICD-10-CM

## 2017-12-06 DIAGNOSIS — I10 Essential (primary) hypertension: Secondary | ICD-10-CM

## 2017-12-06 DIAGNOSIS — D75839 Thrombocytosis, unspecified: Secondary | ICD-10-CM

## 2017-12-06 NOTE — Telephone Encounter (Signed)
Pt seen today for transfer of care.

## 2017-12-06 NOTE — Progress Notes (Signed)
Patient presents to clinic today to establish care. She is a pleasant 77 year old female who  has a past medical history of Allergic rhinitis (01/23/2016), C. difficile colitis, CAD (coronary artery disease), Cancer ([place]), Candidiasis of skin (09/30/2014), Cirrhosis ([place]), Depression, Diabetes mellitus (2008), Gout, Herpes simplex, Hyperglycemia (05/31/2013), Hyperlipidemia, Hypertension, Myocardial infarction [facility]), Neuromuscular disorder ([place]), Obesity, Polycythemia, and Psoriasis.   Acute Concerns: Establish Care   Mechanical Fall(s)-she reports today that over the last 2 weeks she has had 2 separate mechanical falls.  She reports that the first fall happened approximately 2 weeks ago where she slipped outside in the rain.  She denies any injuries during this incident.  Most recently approximately 3 days ago she fell while in the bedroom.  She does not know how she fell, denies any syncopal episodes, feeling headed or dizzy during the fall.  Denies any LOC she did not hit her head.  She does not endorse pain to her right shoulder and right knee when she fell into a dresser.  Pain does not seem to be improving.  Pain is worse with motion and weightbearing.  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 She is maintained on Jakafi  Hx of Coronary Artery Disease/Chronic diastolic CHF - s/p stent placement/STEMI 4/16 with DES x 2. She is maintained on antiplatelet therapy ( 63m ASA daily) and is managed by cardiology.   DM II - managed by endocrinology  Lab Results  Component Value Date   HGBA1C 9.6 10/28/2015   Hepatic Cirrhosis -was first noticed on MRI of the abdomen in July 2018.  [facility] which noted a one-point centimeter indeterminate mass in the right kidney.  She subsequently underwent an MRI of the abdomen which thankfully showed no suspicious renal lesions.  It was noted to  have more Hologic changes in the liver compatible with underlying cirrhosis.  Hepatic  steatosis was also noted  Health Maintenance: Dental -- Does not do routine care Vision -- Routine care Dr. TSatira Sark Immunizations -- UTD  Colonoscopy -- No longer needed  Mammogram -- Due in March 2019  PAP -- Non Longer needs  Bone Density -- Has never had them   She is followed by - Endocrinology - Dr. GCruzita Lederer - Oncology - SLaverna Peace NP/Dr. EMarin Olp- Cardiology - Dr. EMarin Olp - Hepatology   Past Medical History:  Diagnosis Date  . Allergic rhinitis 01/23/2016  . C. difficile colitis   . CAD (coronary artery disease)    a. s/p STEMI in 01/2015 with 95% LCx stenosis and distal 80% LCx stenosis (DESx2 placed)  . Cancer ([facility]    SKIN  . Candidiasis of skin 09/30/2014  . [place]osis ([place]   . Depression   . Diabetes mellitus 2008  . Gout   . Herpes simplex   . Hyperglycemia 05/31/2013  . Hyperlipidemia   . Hypertension   . Myocardial infarction ([place]   . Neuromuscular disorder ([place]    [place]  . Obesity   . Polycythemia    Dr. WElease Hashimoto [facility] hematology  . Psoriasis     Past Surgical History:  Procedure Laterality Date  . BREAST SURGERY Left    milk duct  . CATARACT EXTRACTION, BILATERAL    . COLONOSCOPY    . LEFT HEART CATHETERIZATION WITH CORONARY ANGIOGRAM N/A 01/27/2015   Procedure: LEFT HEART CATHETERIZATION WITH CORONARY ANGIOGRAM;  Surgeon: TTroy Sine MD;  Location: [place] CATH LAB;  Service: Cardiovascular;  Laterality: N/A;  . TONSILLECTOMY    . TOTAL ABDOMINAL HYSTERECTOMY W/ BILATERAL SALPINGOOPHORECTOMY     for heavy periods with appendectomy    Current Outpatient Medications on File Prior to Visit  Medication Sig Dispense Refill  . amLODipine (NORVASC) 5 MG tablet Take 1 tablet (5 mg total) by mouth daily. 30 tablet 1  . aspirin EC 81 MG tablet Take 81 mg by mouth daily.    . canagliflozin (INVOKANA) 100 MG TABS tablet Take 1 tablet (100 mg total) by mouth  daily. 30 tablet 5  . diphenoxylate-atropine (LOMOTIL) 2.5-0.025 MG tablet Take 1-2 tablets by mouth 2 (two) times daily as needed for diarrhea or loose stools. 60 tablet 1  . Insulin Pen Needle 32G X 4 MM MISC Use to inject insulins. 100 each 3  . insulin regular human CONCENTRATED (HUMULIN R U-500 KWIKPEN) 500 UNIT/ML kwikpen Inject 100 units under the skin before breakfast and 100 units before dinner. 18 pen 1  . loperamide (IMODIUM A-D) 2 MG tablet Take 1-2 tablets (2-4 mg total) by mouth daily as needed for diarrhea or loose stools. 30 tablet 1  . LORazepam (ATIVAN) 0.5 MG tablet 1 tablet by mouth 30 minutes before MRI. May repeat x 1 if needed 2 tablet 0  . losartan (COZAAR) 100 MG tablet TAKE 1 TABLET BY MOUTH ONCE DAILY 90 tablet 1  . meloxicam (MOBIC) 7.5 MG tablet Take 1 tablet (7.5 mg total) by mouth daily. 30 tablet 2  . nitroGLYCERIN (NITROSTAT) 0.4 MG SL tablet Place 1 tablet (0.4 mg total) under the tongue every 5 (five) minutes as needed for chest pain. 25 tablet 3  . ONETOUCH VERIO test strip Use to test blood sugar 3 times daily. 100 each 2  . oxyCODONE-acetaminophen (PERCOCET/ROXICET) 5-325 MG tablet Take 1 tablet by mouth every 4 (four) hours as needed for severe pain. 30 tablet 0  . Pancrelipase, Lip-Prot-Amyl, 24000-76000 units CPEP Take 1 capsule (24,000 Units total) by mouth 3 (three) times daily before meals. 90 capsule 1  . promethazine (PHENERGAN) 25 MG tablet Take 0.5 tablets (12.5 mg total) by mouth every 6 (six) hours as needed for nausea or vomiting. 30 tablet 1  . rosuvastatin (CRESTOR) 20 MG tablet TAKE 1 TABLET(20 MG) BY MOUTH DAILY 90 tablet 3  . ruxolitinib phosphate (JAKAFI) 25 MG tablet Take 1 tablet (25 mg total) by mouth 2 (two) times daily. 60 tablet 11   No current facility-administered medications on file prior to visit.     Allergies  Allergen Reactions  . Lisinopril Cough  . Tape Hives  . Doxycycline Hives, Swelling and Rash  . Latex Hives, Itching  and Rash    Family History  Problem Relation Age of Onset  . Diabetes Mother   . Heart disease Mother        CAD  . Hyperlipidemia Mother   . Hypertension Mother   . Kidney disease Mother   . Kidney disease Father   . Breast cancer Maternal Aunt   . Heart disease Maternal Grandmother   . Heart disease Other        maternal aunts and uncles    Social History   Socioeconomic History  . Marital status: Married    Spouse name: Not on file  . Number of children: 2  . Years of education: 2 yr colle  . Highest education level: Not on file  Social Needs  . Financial resource strain: Not  on file  . Food insecurity - worry: Not on file  . Food insecurity - inability: Not on file  . Transportation needs - medical: Not on file  . Transportation needs - non-medical: Not on file  Occupational History  . Occupation: housewife  Tobacco Use  . Smoking status: Former Smoker    Years: 48.00    Types: Cigarettes    Last attempt to quit: 10/18/1986    Years since quitting: 31.1  . Smokeless tobacco: Never Used  Substance and Sexual Activity  . Alcohol use: No    Alcohol/week: 0.0 oz  . Drug use: No  . Sexual activity: Yes    Partners: Male    Birth control/protection: None  Other Topics Concern  . Not on file  Social History Narrative   2 caffeine drink per day.  No regular exercise.     Retired from the bank   2 children (Daughter and a son) son has morbid obesity   2 grandchildren   3 great grandchildren    ROS   See HPI    BP (!) 166/70 (BP Location: Left Arm)   Temp 98.1 F (36.7 C) (Oral)   Wt 223 lb (101.2 kg)   BMI 37.11 kg/m   Physical Exam  Constitutional: She is oriented to person, place, and time and well-developed, well-nourished, and in no distress. No distress.  Obese  HENT:  Head: Normocephalic and atraumatic.  Right Ear: External ear normal.  Left Ear: External ear normal.  Nose: Nose normal.  Mouth/Throat: Oropharynx is clear and moist. No  oropharyngeal exudate.  Cardiovascular: Normal rate, regular rhythm, normal heart sounds and intact distal pulses. Exam reveals no gallop and no friction rub.  No murmur heard. Pulmonary/Chest: Effort normal and breath sounds normal. No respiratory distress. She has no wheezes. She has no rales. She exhibits no tenderness.  Abdominal: Soft. Bowel sounds are normal. She exhibits no distension and no mass. There is no tenderness. There is no rebound and no guarding.  No ascites noted  Musculoskeletal: She exhibits tenderness.       Right shoulder: She exhibits decreased range of motion, tenderness, bony tenderness and pain. She exhibits no swelling, no crepitus, no deformity, no spasm, normal pulse and normal strength.       Right knee: She exhibits decreased range of motion and bony tenderness. She exhibits no swelling, no effusion, no deformity, no erythema, normal alignment and normal meniscus.  Neurological: She is alert and oriented to person, place, and time. Gait normal. GCS score is 15.  Skin: Skin is warm and dry. No rash noted. She is not diaphoretic. No erythema. No pallor.  Psychiatric: Memory, affect and judgment normal.  Nursing note and vitals reviewed.   Assessment/Plan: 1. Encounter to establish care -Follow-up as needed and for CPE - Needs to lose weight   2. Essential hypertension, benign - Elevated today  -Continue to monitor  3. Cirrhosis of liver with ascites, unspecified hepatic cirrhosis type ([place]) -Stable, managed by hepatology  4. Type 2 diabetes mellitus with diabetic polyneuropathy, with long-term current use of insulin ([place]) -Table, managed by endocrinology  5. 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 Obesity (BMI 30-39.9) -Educated on the importance of weight loss through diet and exercise  7. Coronary artery disease involving native coronary artery of native heart without angina pectoris -Stable, managed by cardiology  8. Acute pain  of right shoulder  - DG Shoulder Right; Future -Consider physical therapy 9. Acute pain  of right knee  - DG Knee 1-2 Views Right; Future -Consider physical therapy  [name], NP

## 2017-12-08 ENCOUNTER — Ambulatory Visit (INDEPENDENT_AMBULATORY_CARE_PROVIDER_SITE_OTHER)
Admission: RE | Admit: 2017-12-08 | Discharge: 2017-12-08 | Disposition: A | Discharge status: Home / Self Care | Payer: PPO | Source: Ambulatory Visit | Attending: Adult Health | Admitting: Adult Health

## 2017-12-08 DIAGNOSIS — M25561 Pain in right knee: Secondary | ICD-10-CM | POA: Diagnosis not present

## 2017-12-08 DIAGNOSIS — S4991XA Unspecified injury of right shoulder and upper arm, initial encounter: Secondary | ICD-10-CM | POA: Diagnosis not present

## 2017-12-08 DIAGNOSIS — S8991XA Unspecified injury of right lower leg, initial encounter: Secondary | ICD-10-CM | POA: Diagnosis not present

## 2017-12-08 DIAGNOSIS — M25511 Pain in right shoulder: Secondary | ICD-10-CM | POA: Diagnosis not present

## 2017-12-09 ENCOUNTER — Other Ambulatory Visit: Payer: Self-pay | Admitting: *Deleted

## 2017-12-09 MED ORDER — RUXOLITINIB PHOSPHATE 25 MG PO TABS: 25.0000 mg | ORAL_TABLET | Freq: Two times a day (BID) | ORAL | 11 refills | Status: DC

## 2017-12-12 ENCOUNTER — Telehealth: Payer: Self-pay | Admitting: Adult Health

## 2017-12-12 NOTE — Telephone Encounter (Signed)
Copied from [place] [id number]. Topic: Quick Communication - See Telephone Encounter >> Dec 12, 2017 10:55 AM Bea Graff, NT wrote: CRM for notification. See Telephone encounter for: Pts husband calling and would like a call with wifes knee and shoulder xray results.   12/12/17.

## 2017-12-13 ENCOUNTER — Inpatient Hospital Stay: Payer: PPO | Attending: Hematology & Oncology | Admitting: Family

## 2017-12-13 ENCOUNTER — Inpatient Hospital Stay: Payer: PPO

## 2017-12-13 ENCOUNTER — Encounter: Payer: Self-pay | Admitting: Family

## 2017-12-13 ENCOUNTER — Other Ambulatory Visit: Payer: Self-pay

## 2017-12-13 ENCOUNTER — Other Ambulatory Visit: Payer: Self-pay | Admitting: Family

## 2017-12-13 ENCOUNTER — Other Ambulatory Visit: Payer: Self-pay | Admitting: *Deleted

## 2017-12-13 VITALS — BP 175/63 | HR 94 | Temp 97.7°F | Resp 22 | Wt 221.0 lb

## 2017-12-13 DIAGNOSIS — Z794 Long term (current) use of insulin: Secondary | ICD-10-CM | POA: Insufficient documentation

## 2017-12-13 DIAGNOSIS — N3000 Acute cystitis without hematuria: Secondary | ICD-10-CM

## 2017-12-13 DIAGNOSIS — D508 Other iron deficiency anemias: Secondary | ICD-10-CM

## 2017-12-13 DIAGNOSIS — N3001 Acute cystitis with hematuria: Secondary | ICD-10-CM

## 2017-12-13 DIAGNOSIS — R5383 Other fatigue: Secondary | ICD-10-CM | POA: Diagnosis not present

## 2017-12-13 DIAGNOSIS — Z7982 Long term (current) use of aspirin: Secondary | ICD-10-CM | POA: Insufficient documentation

## 2017-12-13 DIAGNOSIS — D45 Polycythemia vera: Secondary | ICD-10-CM

## 2017-12-13 DIAGNOSIS — R21 Rash and other nonspecific skin eruption: Secondary | ICD-10-CM | POA: Diagnosis not present

## 2017-12-13 LAB — URINALYSIS, COMPLETE (UACMP) WITH MICROSCOPIC
Bilirubin Urine: NEGATIVE
GLUCOSE, UA: NEGATIVE mg/dL
Ketones, ur: NEGATIVE mg/dL
Leukocytes, UA: NEGATIVE
NITRITE: NEGATIVE
PH: 6 (ref 5.0–8.0)
Protein, ur: >300 mg/dL — AB

## 2017-12-13 LAB — CMP (CANCER CENTER ONLY)
ALT: 12 U/L (ref 10–47)
ANION GAP: 11 (ref 5–15)
AST: 30 U/L (ref 11–38)
Albumin: 3.5 g/dL (ref 3.5–5.0)
Alkaline Phosphatase: 115 U/L — ABNORMAL HIGH (ref 26–84)
BILIRUBIN TOTAL: 1.2 mg/dL (ref 0.2–1.6)
BUN: 8 mg/dL (ref 7–22)
CALCIUM: 9.3 mg/dL (ref 8.0–10.3)
CHLORIDE: 103 mmol/L (ref 98–108)
CO2: 27 mmol/L (ref 18–33)
Creatinine: 1.2 mg/dL (ref 0.60–1.20)
GLUCOSE: 233 mg/dL — AB (ref 73–118)
Potassium: 3.3 mmol/L (ref 3.3–4.7)
Sodium: 141 mmol/L (ref 128–145)
Total Protein: 6.4 g/dL (ref 6.4–8.1)

## 2017-12-13 LAB — CBC WITH DIFFERENTIAL (CANCER CENTER ONLY)
BLASTS: 0 %
Band Neutrophils: 6 %
Basophils Absolute: 0 10*3/uL (ref 0.0–0.1)
Basophils Relative: 0 %
EOS ABS: 0 10*3/uL (ref 0.0–0.5)
EOS PCT: 0 %
HCT: 43.7 % (ref 34.8–46.6)
Hemoglobin: 14.4 g/dL (ref 11.6–15.9)
LYMPHS ABS: 2.5 10*3/uL (ref 0.9–3.3)
Lymphocytes Relative: 7 %
MCH: 29.8 pg (ref 26.0–34.0)
MCHC: 33 g/dL (ref 32.0–36.0)
MCV: 90.5 fL (ref 81.0–101.0)
METAMYELOCYTES PCT: 0 %
MYELOCYTES: 0 %
Monocytes Absolute: 1.8 10*3/uL — ABNORMAL HIGH (ref 0.1–0.9)
Monocytes Relative: 5 %
Neutro Abs: 31.1 10*3/uL — ABNORMAL HIGH (ref 1.5–6.5)
Neutrophils Relative %: 82 %
Other: 0 %
PLATELETS: 411 10*3/uL — AB (ref 145–400)
PROMYELOCYTES ABS: 0 %
RBC: 4.83 MIL/uL (ref 3.70–5.32)
RDW: 18.2 % — AB (ref 11.1–15.7)
WBC: 35.4 10*3/uL — AB (ref 3.9–10.0)
nRBC: 0 /100 WBC

## 2017-12-13 LAB — FERRITIN: FERRITIN: 185 ng/mL (ref 9–269)

## 2017-12-13 LAB — IRON AND TIBC
Iron: 80 ug/dL (ref 41–142)
SATURATION RATIOS: 37 % (ref 21–57)
TIBC: 216 ug/dL — AB (ref 236–444)
UIBC: 137 ug/dL

## 2017-12-13 MED ORDER — SULFAMETHOXAZOLE-TRIMETHOPRIM 800-160 MG PO TABS: 1.0000 | ORAL_TABLET | Freq: Two times a day (BID) | ORAL | 0 refills | Status: DC

## 2017-12-13 NOTE — Telephone Encounter (Signed)
Left a message on home # for pt to return my call.  NO DPR on file.  See result note.

## 2017-12-13 NOTE — Progress Notes (Signed)
Hematology and Oncology Follow Up Visit  Amanda [name] [id number] 1940/10/20 77 y.o. 12/13/2017   Principle Diagnosis:  [name] - JAK2 (+)  Current Therapy:   Jakafi 63m PO BID   Interim History:  Ms. KEgleyis here today with her husband for follow-up. She has been lethargic and sleeping a lot at home. She has a diffuse maculopapular rash that is itching. She has several scabbed areas across her chest where she has scratched.  She has had 2 falls recently but thankfully was not seriously injured.  She states that she has not been checking her blood sugars but takes her insulin. Glucose today is 233.  Her WBC count today is 35.4.  No fever, chills, cough, dizziness, chest pain, palpitations, abdominal pain or changes in bowel or bladder habits.  SOB with exertion is unchanged.  No swelling in her extremities. The numbness and tingling in her left ankle and foot is unchanged.  Her appetite comes and goes. She has n/v due to GERD when eating. She has bought and is starting Prilosec today. Her weight is stable.   ECOG Performance Status: 2 - Symptomatic, <50% confined to bed  Medications:  Allergies as of 12/13/2017      Reactions   Lisinopril Cough   Tape Hives   Doxycycline Hives, Swelling, Rash   Latex Hives, Itching, Rash      Medication List        Accurate as of 12/13/17  9:23 AM. Always use your most recent med list.          amLODipine 5 MG tablet Commonly known as:  NORVASC Take 1 tablet (5 mg total) by mouth daily.   aspirin EC 81 MG tablet Take 81 mg by mouth daily.   canagliflozin 100 MG Tabs tablet Commonly known as:  INVOKANA Take 1 tablet (100 mg total) by mouth daily.   diphenoxylate-atropine 2.5-0.025 MG tablet Commonly known as:  LOMOTIL Take 1-2 tablets by mouth 2 (two) times daily as needed for diarrhea or loose stools.   Insulin Pen Needle 32G X 4 MM Misc Use to inject insulins.   insulin regular human CONCENTRATED 500 UNIT/ML  kwikpen Commonly known as:  HUMULIN R U-500 KWIKPEN Inject 100 units under the skin before breakfast and 100 units before dinner.   loperamide 2 MG tablet Commonly known as:  IMODIUM A-D Take 1-2 tablets (2-4 mg total) by mouth daily as needed for diarrhea or loose stools.   LORazepam 0.5 MG tablet Commonly known as:  ATIVAN 1 tablet by mouth 30 minutes before MRI. May repeat x 1 if needed   losartan 100 MG tablet Commonly known as:  COZAAR TAKE 1 TABLET BY MOUTH ONCE DAILY   meloxicam 7.5 MG tablet Commonly known as:  MOBIC Take 1 tablet (7.5 mg total) by mouth daily.   nitroGLYCERIN 0.4 MG SL tablet Commonly known as:  NITROSTAT Place 1 tablet (0.4 mg total) under the tongue every 5 (five) minutes as needed for chest pain.   ONETOUCH VERIO test strip Generic drug:  glucose blood Use to test blood sugar 3 times daily.   oxyCODONE-acetaminophen 5-325 MG tablet Commonly known as:  PERCOCET/ROXICET Take 1 tablet by mouth every 4 (four) hours as needed for severe pain.   Pancrelipase (Lip-Prot-Amyl) 24000-76000 units Cpep Take 1 capsule (24,000 Units total) by mouth 3 (three) times daily before meals.   promethazine 25 MG tablet Commonly known as:  PHENERGAN Take 0.5 tablets (12.5 mg total) by mouth every  6 (six) hours as needed for nausea or vomiting.   rosuvastatin 20 MG tablet Commonly known as:  CRESTOR TAKE 1 TABLET(20 MG) BY MOUTH DAILY   ruxolitinib phosphate 25 MG tablet Commonly known as:  JAKAFI Take 1 tablet (25 mg total) by mouth 2 (two) times daily.       Allergies:  Allergies  Allergen Reactions  . Lisinopril Cough  . Tape Hives  . Doxycycline Hives, Swelling and Rash  . Latex Hives, Itching and Rash    Past Medical History, Surgical history, Social history, and Family History were reviewed and updated.  Review of Systems: All other 10 point review of systems is negative.   Physical Exam:  vitals were not taken for this visit.   Wt  Readings from Last 3 Encounters:  12/06/17 223 lb (101.2 kg)  11/29/17 217 lb 3.2 oz (98.5 kg)  11/07/17 216 lb (98 kg)    Ocular: Sclerae unicteric, pupils equal, round and reactive to light Ear-nose-throat: Oropharynx clear, dentition fair Lymphatic: No cervical, supraclavicular or axillary adenopathy Lungs no rales or rhonchi, good excursion bilaterally Heart regular rate and rhythm, no murmur appreciated Abd soft, nontender, positive bowel sounds, no liver or spleen tip palpated on exam, no fluid wave  MSK no focal spinal tenderness, no joint edema Neuro: non-focal, well-oriented, appropriate affect Breasts: Deferred   Lab Results  Component Value Date   WBC 21.1 (H) 10/12/2017   HGB 12.5 10/12/2017   HCT 37.3 10/12/2017   MCV 88 10/12/2017   PLT 449 (H) 10/12/2017   Lab Results  Component Value Date   FERRITIN 104 10/12/2017   IRON 55 10/12/2017   TIBC 275 10/12/2017   UIBC 220 10/12/2017   IRONPCTSAT 20 (L) 10/12/2017   Lab Results  Component Value Date   RETICCTPCT 2.4 01/30/2014   RBC 4.26 10/12/2017   No results found for: Nils Pyle [facility] Lab Results  Component Value Date   IGA 233 03/29/2014   No results found for: Kathrynn Ducking, MSPIKE, SPEI   Chemistry      Component Value Date/Time   NA 140 10/12/2017 1013   NA 138 01/21/2017 0806   K 3.7 10/12/2017 1013   K 4.1 01/21/2017 0806   CL 103 10/12/2017 1013   CL 103 02/16/2013 0900   CO2 25 10/12/2017 1013   CO2 21 (L) 01/21/2017 0806   BUN 10 10/12/2017 1013   BUN 12.2 01/21/2017 0806   CREATININE 0.9 10/12/2017 1013   CREATININE 1.0 01/21/2017 0806      Component Value Date/Time   CALCIUM 8.7 10/12/2017 1013   CALCIUM 9.3 01/21/2017 0806   ALKPHOS 97 (H) 10/12/2017 1013   ALKPHOS 112 01/21/2017 0806   AST 29 10/12/2017 1013   AST 37 (H) 01/21/2017 0806   ALT 25 10/12/2017 1013   ALT 16 01/21/2017 0806   BILITOT 1.00  10/12/2017 1013   BILITOT 0.89 01/21/2017 0806      Impression and Plan: Amanda [name] is a very pleasant 77 yo caucasian female with polycythemia cera, JAK-2 positive. She verbalized that she is taking her Jakafi 25 mg PO BID as prescribed. Platelet count is 411, Hgb 14.4 and WBC count 35.4.  We will check a UA on her for possible UTI. She is symptomatic with lethargy, diffuse maculopapular rash that itches and has uncontrolled blood sugars.  I instructed her to contact her PCP and to start checking her glucose twice a day keeping a journal.  She will continue her same regimen with Jakafi and we will plan to see her back in another 2 months for follow-up.  They will contact our office with any questions or concerns. We can certainly see her sooner if need be.   Laverna Peace, NP 2/26/20199:23 AM

## 2017-12-14 ENCOUNTER — Ambulatory Visit: Payer: Medicare Other | Admitting: Family

## 2017-12-14 ENCOUNTER — Telehealth: Payer: Self-pay | Admitting: *Deleted

## 2017-12-14 LAB — URINE CULTURE: Culture: NO GROWTH

## 2017-12-14 NOTE — Telephone Encounter (Addendum)
Patient is aware of results and new prescription.   ----- Message from Eliezer Bottom, NP sent at 12/13/2017  3:38 PM EST ----- Regarding: UTI She has a UTI so I sent Bactrim prescription to [facility]. Does she has issues with vaginal yeast with abx? If so, I can add Diflucan. Thank you!!!\  Sarah  ----- Message ----- From: Buel Ream, Lab In [place] Sent: 12/13/2017   3:30 PM To: Eliezer Bottom, NP

## 2017-12-15 ENCOUNTER — Other Ambulatory Visit: Payer: Self-pay | Admitting: *Deleted

## 2017-12-15 ENCOUNTER — Other Ambulatory Visit: Payer: Self-pay | Admitting: Family Medicine

## 2017-12-15 MED ORDER — PROMETHAZINE HCL 25 MG PO TABS: 12.5000 mg | ORAL_TABLET | Freq: Four times a day (QID) | ORAL | 3 refills | Status: DC | PRN

## 2017-12-16 DIAGNOSIS — E1142 Type 2 diabetes mellitus with diabetic polyneuropathy: Secondary | ICD-10-CM | POA: Diagnosis not present

## 2017-12-20 ENCOUNTER — Other Ambulatory Visit: Payer: Self-pay | Admitting: *Deleted

## 2017-12-20 MED ORDER — RUXOLITINIB PHOSPHATE 25 MG PO TABS: 25.0000 mg | ORAL_TABLET | Freq: Two times a day (BID) | ORAL | 11 refills | Status: DC

## 2017-12-27 ENCOUNTER — Ambulatory Visit: Payer: PPO | Admitting: Family Medicine

## 2018-01-04 ENCOUNTER — Other Ambulatory Visit: Payer: Self-pay

## 2018-01-04 ENCOUNTER — Ambulatory Visit: Payer: Self-pay | Admitting: *Deleted

## 2018-01-04 ENCOUNTER — Telehealth: Payer: Self-pay | Admitting: Adult Health

## 2018-01-04 ENCOUNTER — Emergency Department (HOSPITAL_COMMUNITY)
Admission: EM | Admit: 2018-01-04 | Discharge: 2018-01-04 | Disposition: A | Discharge status: Home / Self Care | Payer: PPO | Source: Home / Self Care

## 2018-01-04 ENCOUNTER — Emergency Department (HOSPITAL_BASED_OUTPATIENT_CLINIC_OR_DEPARTMENT_OTHER): Payer: PPO

## 2018-01-04 ENCOUNTER — Other Ambulatory Visit: Payer: Self-pay | Admitting: Family Medicine

## 2018-01-04 ENCOUNTER — Encounter (HOSPITAL_BASED_OUTPATIENT_CLINIC_OR_DEPARTMENT_OTHER): Payer: Self-pay | Admitting: Emergency Medicine

## 2018-01-04 ENCOUNTER — Emergency Department (HOSPITAL_BASED_OUTPATIENT_CLINIC_OR_DEPARTMENT_OTHER)
Admission: EM | Admit: 2018-01-04 | Discharge: 2018-01-04 | Disposition: A | Discharge status: Home / Self Care | Payer: PPO | Attending: Emergency Medicine | Admitting: Emergency Medicine

## 2018-01-04 DIAGNOSIS — I110 Hypertensive heart disease with heart failure: Secondary | ICD-10-CM | POA: Insufficient documentation

## 2018-01-04 DIAGNOSIS — R112 Nausea with vomiting, unspecified: Secondary | ICD-10-CM

## 2018-01-04 DIAGNOSIS — Z85828 Personal history of other malignant neoplasm of skin: Secondary | ICD-10-CM | POA: Diagnosis not present

## 2018-01-04 DIAGNOSIS — E860 Dehydration: Secondary | ICD-10-CM | POA: Insufficient documentation

## 2018-01-04 DIAGNOSIS — Z79899 Other long term (current) drug therapy: Secondary | ICD-10-CM | POA: Insufficient documentation

## 2018-01-04 DIAGNOSIS — Z794 Long term (current) use of insulin: Secondary | ICD-10-CM | POA: Insufficient documentation

## 2018-01-04 DIAGNOSIS — E1140 Type 2 diabetes mellitus with diabetic neuropathy, unspecified: Secondary | ICD-10-CM | POA: Diagnosis not present

## 2018-01-04 DIAGNOSIS — R197 Diarrhea, unspecified: Secondary | ICD-10-CM | POA: Insufficient documentation

## 2018-01-04 DIAGNOSIS — I5030 Unspecified diastolic (congestive) heart failure: Secondary | ICD-10-CM | POA: Insufficient documentation

## 2018-01-04 DIAGNOSIS — Z91040 Latex allergy status: Secondary | ICD-10-CM | POA: Insufficient documentation

## 2018-01-04 DIAGNOSIS — I2510 Atherosclerotic heart disease of native coronary artery without angina pectoris: Secondary | ICD-10-CM | POA: Diagnosis not present

## 2018-01-04 DIAGNOSIS — R1084 Generalized abdominal pain: Secondary | ICD-10-CM

## 2018-01-04 DIAGNOSIS — I252 Old myocardial infarction: Secondary | ICD-10-CM | POA: Diagnosis not present

## 2018-01-04 DIAGNOSIS — Z87891 Personal history of nicotine dependence: Secondary | ICD-10-CM | POA: Diagnosis not present

## 2018-01-04 DIAGNOSIS — Z7982 Long term (current) use of aspirin: Secondary | ICD-10-CM | POA: Diagnosis not present

## 2018-01-04 DIAGNOSIS — N179 Acute kidney failure, unspecified: Secondary | ICD-10-CM

## 2018-01-04 DIAGNOSIS — R1033 Periumbilical pain: Secondary | ICD-10-CM | POA: Diagnosis not present

## 2018-01-04 DIAGNOSIS — R1111 Vomiting without nausea: Secondary | ICD-10-CM | POA: Diagnosis not present

## 2018-01-04 DIAGNOSIS — D729 Disorder of white blood cells, unspecified: Secondary | ICD-10-CM | POA: Diagnosis not present

## 2018-01-04 LAB — COMPREHENSIVE METABOLIC PANEL
ALK PHOS: 107 U/L (ref 38–126)
ALT: 13 U/L — AB (ref 14–54)
ANION GAP: 11 (ref 5–15)
AST: 34 U/L (ref 15–41)
Albumin: 3.9 g/dL (ref 3.5–5.0)
BUN: 24 mg/dL — ABNORMAL HIGH (ref 6–20)
CALCIUM: 8.6 mg/dL — AB (ref 8.9–10.3)
CHLORIDE: 100 mmol/L — AB (ref 101–111)
CO2: 23 mmol/L (ref 22–32)
CREATININE: 1.82 mg/dL — AB (ref 0.44–1.00)
GFR calc non Af Amer: 25 mL/min — ABNORMAL LOW (ref >60)
GFR, EST AFRICAN AMERICAN: 30 mL/min — AB (ref >60)
GLUCOSE: 202 mg/dL — AB (ref 65–99)
Potassium: 3.7 mmol/L (ref 3.5–5.1)
SODIUM: 134 mmol/L — AB (ref 135–145)
Total Bilirubin: 1.6 mg/dL — ABNORMAL HIGH (ref 0.3–1.2)
Total Protein: 6.9 g/dL (ref 6.5–8.1)

## 2018-01-04 LAB — CBC WITH DIFFERENTIAL/PLATELET
BASOS ABS: 0.2 10*3/uL — AB (ref 0.0–0.1)
Basophils Relative: 1 %
EOS ABS: 0.5 10*3/uL (ref 0.0–0.7)
Eosinophils Relative: 2 %
HCT: 41.2 % (ref 36.0–46.0)
HEMOGLOBIN: 13.7 g/dL (ref 12.0–15.0)
LYMPHS PCT: 5 %
Lymphs Abs: 1.2 10*3/uL (ref 0.7–4.0)
MCH: 29.6 pg (ref 26.0–34.0)
MCHC: 33.3 g/dL (ref 30.0–36.0)
MCV: 89 fL (ref 78.0–100.0)
MONO ABS: 1.4 10*3/uL — AB (ref 0.1–1.0)
Monocytes Relative: 6 %
NEUTROS PCT: 86 %
Neutro Abs: 20.3 10*3/uL — ABNORMAL HIGH (ref 1.7–7.7)
PLATELETS: 461 10*3/uL — AB (ref 150–400)
RBC: 4.63 MIL/uL (ref 3.87–5.11)
RDW: 17.2 % — ABNORMAL HIGH (ref 11.5–15.5)
WBC: 23.6 10*3/uL — AB (ref 4.0–10.5)

## 2018-01-04 LAB — LIPASE, BLOOD: Lipase: 29 U/L (ref 11–51)

## 2018-01-04 LAB — I-STAT CG4 LACTIC ACID, ED: LACTIC ACID, VENOUS: 1.57 mmol/L (ref 0.5–1.9)

## 2018-01-04 MED ORDER — SODIUM CHLORIDE 0.9 % IV SOLN: INTRAVENOUS | Status: DC

## 2018-01-04 MED ORDER — SODIUM CHLORIDE 0.9 % IV BOLUS (SEPSIS)
500.0000 mL | Freq: Once | INTRAVENOUS | Status: AC
  Administered 2018-01-04: 500 mL via INTRAVENOUS

## 2018-01-04 MED ORDER — ONDANSETRON HCL 4 MG/2ML IJ SOLN
4.0000 mg | Freq: Once | INTRAMUSCULAR | Status: AC | PRN
  Administered 2018-01-04: 4 mg via INTRAVENOUS
  Filled 2018-01-04: qty 2

## 2018-01-04 MED ORDER — ONDANSETRON 4 MG PO TBDP: 4.0000 mg | ORAL_TABLET | Freq: Three times a day (TID) | ORAL | 0 refills | Status: DC | PRN

## 2018-01-04 MED ORDER — IOPAMIDOL (ISOVUE-300) INJECTION 61%: 100.0000 mL | Freq: Once | INTRAVENOUS | Status: DC | PRN

## 2018-01-04 MED ORDER — ONDANSETRON HCL 4 MG/2ML IJ SOLN
4.0000 mg | Freq: Once | INTRAMUSCULAR | Status: AC
  Administered 2018-01-04: 4 mg via INTRAVENOUS
  Filled 2018-01-04: qty 2

## 2018-01-04 MED ORDER — SODIUM CHLORIDE 0.9 % IV BOLUS (SEPSIS)
250.0000 mL | Freq: Once | INTRAVENOUS | Status: AC
  Administered 2018-01-04: 250 mL via INTRAVENOUS

## 2018-01-04 NOTE — Telephone Encounter (Signed)
Monitor for ER arrival

## 2018-01-04 NOTE — ED Notes (Signed)
Given gingerale for fluid challenge

## 2018-01-04 NOTE — ED Triage Notes (Signed)
Since Sunday , has been having n/v/d

## 2018-01-04 NOTE — ED Notes (Signed)
Pt given gingerale for fluid challenge.

## 2018-01-04 NOTE — Discharge Instructions (Signed)
Your labs and presentation today are consistent with dehydration.  It is important to make sure you drink plenty of fluids.  Use the nausea medicine as prescribed if needed for nausea.  Otherwise if you develop any recurrent vomiting or abdominal pain or any other new/concerning symptoms, return to the ER immediately.  See your primary care doctor in the next 1-2 days.

## 2018-01-04 NOTE — Telephone Encounter (Signed)
Pt called requesting Lomotil. I called patient back and she stated that she had been having vomiting from Sunday to Tues. But has been having diarrhea the whole time.  She states the diarrhea is severe and watery. Having severe abd cramping constantly.  She feels dehydrated, taking very little liquids. Feels weak when standing and dizzy. Denies fever. Pt advised to be taken to the nearest emergency room. Her husband will be taking her to [facility]. Flow at [facility] at  [place] notified.  Reason for Disposition . [1] SEVERE abdominal pain (e.g., excruciating) AND [2] present > 1 hour  Answer Assessment - Initial Assessment Questions 1. DIARRHEA SEVERITY: "How bad is the diarrhea?" "How many extra stools have you had in the past 24 hours than normal?"    - MILD: Few loose or mushy BMs; increase of 1-3 stools over normal daily number of stools; mild increase in ostomy output.   - MODERATE: Increase of 4-6 stools daily over normal; moderate increase in ostomy output.   - SEVERE (or Worst Possible): Increase of 7 or more stools daily over normal; moderate increase in ostomy output; incontinence.     severe 2. ONSET: "When did the diarrhea begin?"      Started on Sunday 3. BM CONSISTENCY: "How loose or watery is the diarrhea?"      watery 4. VOMITING: "Are you also vomiting?" If so, ask: "How many times in the past 24 hours?"      Not vomiting but was on Sunday, Monday and Tuesday 5. ABDOMINAL PAIN: "Are you having any abdominal pain?" If yes: "What does it feel like?" (e.g., crampy, dull, intermittent, constant)      abd cramping that is constant 6. ABDOMINAL PAIN SEVERITY: If present, ask: "How bad is the pain?"  (e.g., Scale 1-10; mild, moderate, or severe)    - MILD (1-3): doesn't interfere with normal activities, abdomen soft and not tender to touch     - MODERATE (4-7): interferes with normal activities or awakens from sleep, tender to touch     - SEVERE (8-10): excruciating pain, doubled  over, unable to do any normal activities       Pain #9 7. ORAL INTAKE: If vomiting, "Have you been able to drink liquids?" "How much fluids have you had in the past 24 hours?"     Drinking a little water and gatorade and some apple juice 8. HYDRATION: "Any signs of dehydration?" (e.g., dry mouth [not just dry lips], too weak to stand, dizziness, new weight loss) "When did you last urinate?"     Dry mouth, lips, weak to stand, dizziness 9. EXPOSURE: "Have you traveled to a foreign country recently?" "Have you been exposed to anyone with diarrhea?" "Could you have eaten any food that was spoiled?"     Not traveled anywhere. [place] the carnival [place] and had hotdog and then stomach problems 10. OTHER SYMPTOMS: "Do you have any other symptoms?" (e.g., fever, blood in stool)       No blood or fever 11. PREGNANCY: "Is there any chance you are pregnant?" "When was your last menstrual period?"       no  Protocols used: DIARRHEA-A-AH

## 2018-01-04 NOTE — ED Notes (Signed)
Attempted IV x 2 to RAC and right hand, tol well

## 2018-01-04 NOTE — Telephone Encounter (Signed)
Confirmed patient has arrived in ER now.

## 2018-01-04 NOTE — ED Notes (Signed)
Pt placed on 2L O2 due to sats being between 89%-91%. Pt not happy about being placed on O2.

## 2018-01-04 NOTE — ED Notes (Signed)
Has not had a stool while in the ED

## 2018-01-04 NOTE — Telephone Encounter (Signed)
Copied from [place] [id number]. Topic: Quick Communication - See Telephone Encounter >> Jan 04, 2018  8:56 AM Ahmed Prima L wrote: CRM for notification. See Telephone encounter for:   01/04/18.  diphenoxylate-atropine (LOMOTIL) 2.5-0.025 MG tablet  [facility] [phone] - [place], [place] - [address]

## 2018-01-04 NOTE — ED Provider Notes (Signed)
5:34 PM Care transferred to me.  Patient is feeling better and has not had any further vomiting.  CT without acute abnormality.  She has had cirrhosis and the mild ascites before.  I highly doubt that the ascites is the cause of her symptoms today.  Highly doubt SBP.  There is otherwise no acute intra-abdominal emergency seen on CT.  The white blood cell count elevation is acute on chronic and actually lower than last month.  She does have evidence of acute kidney injury with dehydration.  We discussed options including observation admission with IV fluids versus going home with increased fluid intake at home.  The patient definitely wants to go home and does not want to be admitted.  Given she is tolerating oral fluids in the ED I think this is reasonable.  I will give her Zofran prescription as well.  However we did discuss that if she is unable to tolerate fluids or develops vomiting she will need to return.  As for her electrolytes her calcium is slightly low but not critical.  I think she is stable for discharge home but was warned that if she does not have increased fluid intake she could have worsening renal function. Return precautions.  Results for orders placed or performed during the hospital encounter of 01/04/18  CBC with Differential  Result Value Ref Range   WBC 23.6 (H) 4.0 - 10.5 K/uL   RBC 4.63 3.87 - 5.11 MIL/uL   Hemoglobin 13.7 12.0 - 15.0 g/dL   HCT 41.2 36.0 - 46.0 %   MCV 89.0 78.0 - 100.0 fL   MCH 29.6 26.0 - 34.0 pg   MCHC 33.3 30.0 - 36.0 g/dL   RDW 17.2 (H) 11.5 - 15.5 %   Platelets 461 (H) 150 - 400 K/uL   Neutrophils Relative % 86 %   Lymphocytes Relative 5 %   Monocytes Relative 6 %   Eosinophils Relative 2 %   Basophils Relative 1 %   Neutro Abs 20.3 (H) 1.7 - 7.7 K/uL   Lymphs Abs 1.2 0.7 - 4.0 K/uL   Monocytes Absolute 1.4 (H) 0.1 - 1.0 K/uL   Eosinophils Absolute 0.5 0.0 - 0.7 K/uL   Basophils Absolute 0.2 (H) 0.0 - 0.1 K/uL   WBC Morphology ATYPICAL  LYMPHOCYTES   Comprehensive metabolic panel  Result Value Ref Range   Sodium 134 (L) 135 - 145 mmol/L   Potassium 3.7 3.5 - 5.1 mmol/L   Chloride 100 (L) 101 - 111 mmol/L   CO2 23 22 - 32 mmol/L   Glucose, Bld 202 (H) 65 - 99 mg/dL   BUN 24 (H) 6 - 20 mg/dL   Creatinine, Ser 1.82 (H) 0.44 - 1.00 mg/dL   Calcium 8.6 (L) 8.9 - 10.3 mg/dL   Total Protein 6.9 6.5 - 8.1 g/dL   Albumin 3.9 3.5 - 5.0 g/dL   AST 34 15 - 41 U/L   ALT 13 (L) 14 - 54 U/L   Alkaline Phosphatase 107 38 - 126 U/L   Total Bilirubin 1.6 (H) 0.3 - 1.2 mg/dL   GFR calc non Af Amer 25 (L) >60 mL/min   GFR calc Af Amer 30 (L) >60 mL/min   Anion gap 11 5 - 15  Lipase, blood  Result Value Ref Range   Lipase 29 11 - 51 U/L  I-Stat CG4 Lactic Acid, ED  Result Value Ref Range   Lactic Acid, Venous 1.57 0.5 - 1.9 mmol/L   Ct Abdomen Pelvis  Wo Contrast  Result Date: 01/04/2018 CLINICAL DATA:  Nausea, vomiting, and diarrhea for 4 days, periumbilical abdominal pain, history hypertension, diabetes mellitus EXAM: CT ABDOMEN AND PELVIS WITHOUT CONTRAST TECHNIQUE: Multidetector CT imaging of the abdomen and pelvis was performed following the standard protocol without IV contrast. IV contrast not administered due to decreased GFR. Sagittal and coronal MPR images reconstructed from axial data set. Oral contrast was administered for the exam. Upper abdomen was reimaged due to patient motion. COMPARISON:  01/28/2014 FINDINGS: Lower chest: Bibasilar atelectasis greater on RIGHT Hepatobiliary: Nodular hepatic contours consistent with cirrhosis. Dependent density within the gallbladder compatible with tiny calculi. Pancreas: Atrophic Spleen: Enlarged, 21.4 x 7.0 x 16.8 cm (volume = 1300 cm^3). No focal lesions. Adrenals/Urinary Tract: Adrenal glands, kidneys, ureters, and bladder unremarkable Stomach/Bowel: Appendix not visualized. Minimal distal colonic diverticulosis without evidence of diverticulitis. Stomach and bowel loops otherwise  normal for technique. Vascular/Lymphatic: Atherosclerotic calcifications aorta without aneurysm. No adenopathy. Reproductive: Atrophic uterus and adnexa Other: Scattered free fluid. No free air. Questionable small LEFT inguinal hernia containing fat. Musculoskeletal: Bones demineralized. IMPRESSION: Cirrhotic liver with significant splenomegaly and scattered ascites. No other definite intra-abdominal or intrapelvic abnormalities. Questionable small LEFT inguinal hernia containing fat. Minimal distal colonic diverticulosis. Electronically Signed   By: Lavonia Dana M.D.   On: 01/04/2018 16:13   Dg Shoulder Right  Result Date: 12/08/2017 CLINICAL DATA:  Pain following recent fall EXAM: RIGHT SHOULDER - 2+ VIEW COMPARISON:  None. FINDINGS: Oblique, Y scapular, and axillary images were obtained. There is no appreciable fracture or dislocation. Joint spaces appear unremarkable. No erosive change. Visualized right lung is clear. IMPRESSION: No fracture or dislocation.  No appreciable arthropathy. Electronically Signed   By: Lowella Grip III M.D.   On: 12/08/2017 11:04   Dg Knee 1-2 Views Right  Result Date: 12/08/2017 CLINICAL DATA:  Pain following recent fall EXAM: RIGHT KNEE - 1-2 VIEW COMPARISON:  None. FINDINGS: Weightbearing frontal and weight-bearing lateral views were obtained. No fracture or dislocation. No joint effusion. There is slight narrowing medially and in the patellofemoral joint regions. There is a spur along the anterior superior patella. No erosive change. There is calcification in the popliteal artery. IMPRESSION: Narrowing medially and in the patellofemoral joints, consistent with a degree of osteoarthritis. No fracture or joint effusion. Spur along the anterior superior patella, likely due to distal quadriceps calcific tendinosis. There is a focus of popliteal artery atherosclerosis. Electronically Signed   By: Lowella Grip III M.D.   On: 12/08/2017 11:03      Sherwood Gambler,  MD 01/04/18 1736

## 2018-01-04 NOTE — ED Provider Notes (Addendum)
[facility] EMERGENCY DEPARTMENT Provider Note   CSN: [id number] Arrival date & time: 01/04/18  1223     History   Chief Complaint Chief Complaint  Patient presents with  . Diarrhea    HPI Amanda [name] is a 77 y.o. female.  Patient with acute onset of nausea vomiting and diarrhea starting Sunday night and going into Monday.  And continued.  No vomiting since last night.  But diarrhea has continued.  No fevers no blood in the vomit or the diarrhea.  Patient with diffuse abdominal pain but mostly periumbilical.  Patient was at a carnival on Sunday did eat some hot dogs with other family members had the same hot dogs and no one else got sick.  Patient's had frequent episodes of the diarrhea and also frequent episodes of vomiting but as stated stopped last night.      Past Medical History:  Diagnosis Date  . Allergic rhinitis 01/23/2016  . C. difficile colitis   . CAD (coronary artery disease)    a. s/p STEMI in 01/2015 with 95% LCx stenosis and distal 80% LCx stenosis (DESx2 placed)  . Cancer ([place])    SKIN  . Candidiasis of skin 09/30/2014  . Cirrhosis ([place])   . Depression   . Diabetes mellitus 2008  . Gout   . Herpes simplex   . Hyperglycemia 05/31/2013  . Hyperlipidemia   . Hypertension   . Myocardial infarction ([place])   . Neuromuscular disorder ([place])    BELL PALSY  . Obesity   . Polycythemia    Dr. Elease Hashimoto- [facility]  . Psoriasis     Patient Active Problem List   Diagnosis Date Noted  . Hip injury, left, subsequent encounter 11/08/2017  . Iron deficiency anemia 12/22/2016  . OSA (obstructive sleep apnea) 04/21/2016  . Osteopenia 04/07/2016  . Lung nodule, solitary 02/05/2016  . 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 Dyspnea 01/28/2016  . Allergic rhinitis 01/23/2016  . Hepatic cirrhosis ([place]) 10/28/2015  . Irritable bowel syndrome 08/12/2015  . Obesity (BMI 30-39.9) 04/30/2015  . Diabetic retinopathy of both eyes ([place]) 04/18/2015  . Former  smoker 04/18/2015  . GAD (generalized anxiety disorder) 04/18/2015  . Status post primary angioplasty with coronary stent 04/18/2015  . Coronary artery disease involving native coronary artery 03/01/2015  . Acute diastolic heart failure ([place]) 02/01/2015  . ST elevation myocardial infarction (STEMI) involving left circumflex coronary artery in recovery phase ([place]) 01/27/2015  . Idioventricular rhythm ([place])   . Diabetic peripheral neuropathy associated with type 2 diabetes mellitus ([place]) 01/23/2015  . 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 Thrombocytosis ([place]) 07/04/2014  . Cholelithiasis 02/07/2014  . Chronic diarrhea 01/29/2014  . 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 Essential hypertension 05/31/2013  . History of Bell's palsy 05/26/2013  . DERMATITIS, ATOPIC 11/16/2010  . DIZZINESS 11/16/2010  . DIASTOLIC DYSFUNCTION 93/71/6967  . Hyperlipidemia 12/15/2009  . Essential hypertension, benign 11/24/2009  . PSORIASIS 11/24/2009  . Depression 09/13/2009  . 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 BREAST SURGERY Left    milk duct  . CATARACT EXTRACTION, BILATERAL    . COLONOSCOPY    . LEFT HEART CATHETERIZATION WITH CORONARY ANGIOGRAM N/A 01/27/2015   Procedure: LEFT HEART CATHETERIZATION WITH CORONARY ANGIOGRAM;  Surgeon: [name], MD;  Location: [facility] CATH LAB;  Service: Cardiovascular;  Laterality: N/A;  . TONSILLECTOMY    . TOTAL  ABDOMINAL HYSTERECTOMY W/ BILATERAL SALPINGOOPHORECTOMY     for heavy periods with appendectomy    OB History    No data available       Home Medications    Prior to Admission medications   Medication Sig Start Date End Date Taking? Authorizing Provider  amLODipine (NORVASC) 5 MG tablet Take 1 tablet (5 mg total) by mouth daily. 01/26/16   Hali Marry, MD  aspirin EC 81 MG tablet Take 81 mg by mouth daily.     [provider]  canagliflozin (INVOKANA) 100 MG TABS tablet Take 1 tablet (100 mg total) by mouth daily. 05/06/17   [name], MD  diphenoxylate-atropine (LOMOTIL) 2.5-0.025 MG tablet Take 1-2 tablets by mouth 2 (two) times daily as needed for diarrhea or loose stools. 12/02/16   Hali Marry, MD  Insulin Pen Needle 32G X 4 MM MISC Use to inject insulins. 01/10/17   [name], MD  insulin regular human CONCENTRATED (HUMULIN R U-500 KWIKPEN) 500 UNIT/ML kwikpen Inject 100 units under the skin before breakfast and 100 units before dinner. 08/08/17   [name], MD  loperamide (IMODIUM A-D) 2 MG tablet Take 1-2 tablets (2-4 mg total) by mouth daily as needed for diarrhea or loose stools. 05/28/16   Hali Marry, MD  LORazepam (ATIVAN) 0.5 MG tablet 1 tablet by mouth 30 minutes before MRI. May repeat x 1 if needed 04/25/17   Debbrah Alar, NP  losartan (COZAAR) 100 MG tablet TAKE 1 TABLET BY MOUTH ONCE DAILY 08/30/17   Debbrah Alar, NP  meloxicam (MOBIC) 7.5 MG tablet Take 1 tablet (7.5 mg total) by mouth daily. 11/07/17   [place], Sharyn Lull, MD  nitroGLYCERIN (NITROSTAT) 0.4 MG SL tablet Place 1 tablet (0.4 mg total) under the tongue every 5 (five) minutes as needed for chest pain. 12/21/16   [place], Holli Humbles, NP  [facility] VERIO test strip Use to test blood sugar 3 times daily. 08/05/17   [name], MD  oxyCODONE-acetaminophen (PERCOCET/ROXICET) 5-325 MG tablet Take 1 tablet by mouth every 4 (four) hours as needed for severe pain. 11/07/17   [place], Sharyn Lull, MD  Pancrelipase, Lip-Prot-Amyl, 24000-76000 units CPEP Take 1 capsule (24,000 Units total) by mouth 3 (three) times daily before meals. 12/21/16   [place], Holli Humbles, NP  promethazine (PHENERGAN) 25 MG tablet Take 0.5 tablets (12.5 mg total) by mouth every 6 (six) hours as needed for nausea or vomiting. 12/15/17   Volanda Napoleon, MD  rosuvastatin (CRESTOR) 20 MG tablet TAKE 1 TABLET(20  MG) BY MOUTH DAILY 10/17/17   [name], MD  ruxolitinib phosphate (JAKAFI) 25 MG tablet Take 1 tablet (25 mg total) by mouth 2 (two) times daily. 12/20/17   Volanda Napoleon, MD  sulfamethoxazole-trimethoprim (BACTRIM DS,SEPTRA DS) 800-160 MG tablet Take 1 tablet by mouth 2 (two) times daily. 12/13/17   [place], Holli Humbles, NP    Family History Family History  Problem Relation Age of Onset  . Diabetes Mother   . Heart disease Mother        CAD  . Hyperlipidemia Mother   . Hypertension Mother   . Kidney disease Mother   . Kidney disease Father   . Breast cancer Maternal Aunt   . Heart disease Maternal Grandmother   . Heart disease Other        maternal aunts and uncles    Social History Social History   Tobacco Use  . Smoking status: Former Smoker  Years: 48.00    Types: Cigarettes    Last attempt to quit: 10/18/1986    Years since quitting: 31.2  . Smokeless tobacco: Never Used  Substance Use Topics  . Alcohol use: No    Alcohol/week: 0.0 oz  . Drug use: No     Allergies   Lisinopril; Tape; Doxycycline; and Latex   Review of Systems Review of Systems  Constitutional: Negative for fever.  HENT: Negative for congestion.   Respiratory: Negative for shortness of breath.   Cardiovascular: Negative for chest pain.  Gastrointestinal: Positive for abdominal pain, diarrhea, nausea and vomiting.  Genitourinary: Negative for dysuria.  Musculoskeletal: Negative for back pain and myalgias.  Hematological: Does not bruise/bleed easily.  Psychiatric/Behavioral: Negative for confusion.     Physical Exam Updated Vital Signs Ht 1.626 m (5' 4" )   Wt 97.1 kg (214 lb)   BMI 36.73 kg/m   Physical Exam  Constitutional: She is oriented to person, place, and time. She appears well-developed and well-nourished. No distress.  HENT:  Head: Normocephalic and atraumatic.  Mouth/Throat: Oropharynx is clear and moist.  Mucous membranes slightly moist.  Lips dry.  Eyes:  Conjunctivae and EOM are normal. Pupils are equal, round, and reactive to light.  Neck: Neck supple.  Cardiovascular: Normal rate, regular rhythm and normal heart sounds.  Pulmonary/Chest: Effort normal and breath sounds normal. No respiratory distress.  Abdominal: Soft. Bowel sounds are normal. She exhibits distension. There is tenderness.  Mild tenderness periumbilical area no guarding.  Musculoskeletal: Normal range of motion.  Neurological: She is alert and oriented to person, place, and time. No cranial nerve deficit or sensory deficit. She exhibits normal muscle tone. Coordination normal.  Skin: Skin is warm.  Nursing note and vitals reviewed.    ED Treatments / Results  Labs (all labs ordered are listed, but only abnormal results are displayed) Labs Reviewed  CBC WITH DIFFERENTIAL/PLATELET - Abnormal; Notable for the following components:      Result Value   WBC 23.6 (*)    RDW 17.2 (*)    Platelets 461 (*)    Neutro Abs 20.3 (*)    Monocytes Absolute 1.4 (*)    Basophils Absolute 0.2 (*)    All other components within normal limits  COMPREHENSIVE METABOLIC PANEL - Abnormal; Notable for the following components:   Sodium 134 (*)    Chloride 100 (*)    Glucose, Bld 202 (*)    BUN 24 (*)    Creatinine, Ser 1.82 (*)    Calcium 8.6 (*)    ALT 13 (*)    Total Bilirubin 1.6 (*)    GFR calc non Af Amer 25 (*)    GFR calc Af Amer 30 (*)    All other components within normal limits  LIPASE, BLOOD  PATHOLOGIST SMEAR REVIEW    EKG  EKG Interpretation None       Radiology No results found.  Procedures Procedures (including critical care time)  Medications Ordered in ED Medications  0.9 %  sodium chloride infusion ( Intravenous Rate/Dose Change 01/04/18 1300)  iopamidol (ISOVUE-300) 61 % injection 100 mL (not administered)  sodium chloride 0.9 % bolus 500 mL (0 mLs Intravenous Stopped 01/04/18 1345)  ondansetron (ZOFRAN) injection 4 mg (4 mg Intravenous Given  01/04/18 1311)  ondansetron (ZOFRAN) injection 4 mg (4 mg Intravenous Given 01/04/18 1418)     Initial Impression / Assessment and Plan / ED Course  I have reviewed the triage vital signs and the nursing  notes.  Pertinent labs & imaging results that were available during my care of the patient were reviewed by me and considered in my medical decision making (see chart for details).     Patient clinically looks dehydrated.  Abdomen with some mild tenderness some distention no guarding.  Patient's BUN and creatinine are elevated consistent with dehydration.  Marked leukocytosis which is concerning.  Patient also has elevation of bilirubin at 1.6.  Otherwise electrolytes without significant abnormalities.  CT scan of the abdomen has been ordered for further evaluation.  Patient may very well require admission for further IV hydration.  Patient had C. difficile in the past so we will do testing for that.  But with all the episodes of vomiting it sounds like it would be unlikely to be C. difficile.  Patient given IV fluids here initial bolus of 500 cc.  We will give another 250 cc.  Patient treated with Zofran twice for vomiting.  Because patient's had some vomiting of the oral CT contrast.  CT originally ordered with IV contrast but due to her GFR will be done just with oral.  Final Clinical Impressions(s) / ED Diagnoses   Final diagnoses:  Nausea vomiting and diarrhea  Dehydration    ED Discharge Orders    None       Fredia Sorrow, MD 01/04/18 1440   Addendum:  Patient has a history of polycythemia vera so the marked leukocytosis may be related to that.  No other CBC for comparison today.  But she is followed by hematology oncology for this.   Fredia Sorrow, MD 01/04/18 1444  Patient followed by Domenick Bookbinder  primary care.    Fredia Sorrow, MD 01/04/18 [phone]

## 2018-01-05 ENCOUNTER — Other Ambulatory Visit: Payer: Self-pay | Admitting: Family Medicine

## 2018-01-05 LAB — PATHOLOGIST SMEAR REVIEW

## 2018-01-05 MED ORDER — DIPHENOXYLATE-ATROPINE 2.5-0.025 MG PO TABS: 1.0000 | ORAL_TABLET | Freq: Two times a day (BID) | ORAL | 0 refills | Status: DC | PRN

## 2018-01-05 NOTE — Telephone Encounter (Signed)
Ok to refill 

## 2018-01-05 NOTE — Telephone Encounter (Signed)
Printed for [employer] to sign.  Will fax.

## 2018-01-10 ENCOUNTER — Ambulatory Visit (INDEPENDENT_AMBULATORY_CARE_PROVIDER_SITE_OTHER): Payer: PPO | Admitting: Adult Health

## 2018-01-10 ENCOUNTER — Ambulatory Visit (INDEPENDENT_AMBULATORY_CARE_PROVIDER_SITE_OTHER)
Admission: RE | Admit: 2018-01-10 | Discharge: 2018-01-10 | Disposition: A | Discharge status: Home / Self Care | Payer: PPO | Source: Ambulatory Visit | Attending: Adult Health | Admitting: Adult Health

## 2018-01-10 ENCOUNTER — Encounter: Payer: Self-pay | Admitting: Adult Health

## 2018-01-10 ENCOUNTER — Other Ambulatory Visit: Payer: Self-pay | Admitting: Adult Health

## 2018-01-10 ENCOUNTER — Telehealth: Payer: Self-pay

## 2018-01-10 VITALS — BP 160/80 | Temp 97.9°F | Wt 218.0 lb

## 2018-01-10 DIAGNOSIS — R0989 Other specified symptoms and signs involving the circulatory and respiratory systems: Secondary | ICD-10-CM | POA: Diagnosis not present

## 2018-01-10 DIAGNOSIS — R8290 Unspecified abnormal findings in urine: Secondary | ICD-10-CM | POA: Diagnosis not present

## 2018-01-10 DIAGNOSIS — E1142 Type 2 diabetes mellitus with diabetic polyneuropathy: Secondary | ICD-10-CM | POA: Diagnosis not present

## 2018-01-10 DIAGNOSIS — K580 Irritable bowel syndrome with diarrhea: Secondary | ICD-10-CM

## 2018-01-10 DIAGNOSIS — Z794 Long term (current) use of insulin: Secondary | ICD-10-CM

## 2018-01-10 DIAGNOSIS — R112 Nausea with vomiting, unspecified: Secondary | ICD-10-CM | POA: Diagnosis not present

## 2018-01-10 DIAGNOSIS — R197 Diarrhea, unspecified: Secondary | ICD-10-CM

## 2018-01-10 DIAGNOSIS — J9811 Atelectasis: Secondary | ICD-10-CM | POA: Diagnosis not present

## 2018-01-10 LAB — URINALYSIS, ROUTINE W REFLEX MICROSCOPIC
Bilirubin Urine: NEGATIVE
Hgb urine dipstick: NEGATIVE
KETONES UR: NEGATIVE
LEUKOCYTES UA: NEGATIVE
NITRITE: NEGATIVE
TOTAL PROTEIN, URINE-UPE24: 100 — AB
URINE GLUCOSE: NEGATIVE
UROBILINOGEN UA: 0.2 (ref 0.0–1.0)
pH: 6 (ref 5.0–8.0)

## 2018-01-10 LAB — BASIC METABOLIC PANEL
BUN: 9 mg/dL (ref 6–23)
CALCIUM: 8.9 mg/dL (ref 8.4–10.5)
CO2: 25 mEq/L (ref 19–32)
Chloride: 104 mEq/L (ref 96–112)
Creatinine, Ser: 0.87 mg/dL (ref 0.40–1.20)
GFR: 66.87 mL/min (ref >60.00)
Glucose, Bld: 235 mg/dL — ABNORMAL HIGH (ref 70–99)
POTASSIUM: 4 meq/L (ref 3.5–5.1)
SODIUM: 139 meq/L (ref 135–145)

## 2018-01-10 LAB — CBC WITH DIFFERENTIAL/PLATELET
Basophils Absolute: 0.2 10*3/uL — ABNORMAL HIGH (ref 0.0–0.1)
Basophils Relative: 0.7 % (ref 0.0–3.0)
EOS PCT: 1.6 % (ref 0.0–5.0)
Eosinophils Absolute: 0.5 10*3/uL (ref 0.0–0.7)
HCT: 42.5 % (ref 36.0–46.0)
HEMOGLOBIN: 14.1 g/dL (ref 12.0–15.0)
Lymphocytes Relative: 8 % — ABNORMAL LOW (ref 12.0–46.0)
Lymphs Abs: 2.3 10*3/uL (ref 0.7–4.0)
MCHC: 33.1 g/dL (ref 30.0–36.0)
MCV: 89.7 fl (ref 78.0–100.0)
MONOS PCT: 4.2 % (ref 3.0–12.0)
Monocytes Absolute: 1.2 10*3/uL — ABNORMAL HIGH (ref 0.1–1.0)
NEUTROS PCT: 85.5 % — AB (ref 43.0–77.0)
Neutro Abs: 24.6 10*3/uL — ABNORMAL HIGH (ref 1.4–7.7)
Platelets: 640 10*3/uL — ABNORMAL HIGH (ref 150.0–400.0)
RBC: 4.73 Mil/uL (ref 3.87–5.11)
RDW: 17.3 % — ABNORMAL HIGH (ref 11.5–15.5)

## 2018-01-10 LAB — HEMOGLOBIN A1C: HEMOGLOBIN A1C: 8.4 % — AB (ref 4.6–6.5)

## 2018-01-10 NOTE — Progress Notes (Signed)
Subjective:    Patient ID: Amanda [name], female    DOB: 1941-08-12, 77 y.o.   MRN: [id number]  HPI  77 year old female who  has a past medical history of Allergic rhinitis (01/23/2016), C. difficile colitis, CAD (coronary artery disease), Cancer ([place]), Candidiasis of skin (09/30/2014), Cirrhosis ([place]), Depression, Diabetes mellitus (2008), Gout, Herpes simplex, Hyperglycemia (05/31/2013), Hyperlipidemia, Hypertension, Myocardial infarction [facility]), Neuromuscular disorder ([place]), Obesity, Polycythemia, and Psoriasis.  She presents to the office today for follow up after being seen in the ER 6 days ago with the complaint of nausea, vomiting, and diarrhea. She was given IV fluids and Zofran in the ER. CT of the abdomen showed no acute process.   IMPRESSION: Cirrhotic liver with significant splenomegaly and scattered ascites. No other definite intra-abdominal or intrapelvic abnormalities. Questionable small LEFT inguinal hernia containing fat. Minimal distal colonic diverticulosis  Today in the office she reports that she does not feel any better. She continues to have nausea, vomiting, and fatigue.  She also reports that her appetite is poor but has started to improve slowly.  She is sleeping throughout the day but will not sleep at night.  Her last bout of diarrhea was approximately 2 days ago, morning she had bowel movement that was slightly more formed.  She did not denies that this is any change from her usual IBS cycle  She has had one episode of vomiting, but reports this was due to phlegm.  She has not vomited since then.  Continues to be nauseated, is taking Zofran as prescribed.  She is able to hold down water and ginger ale  She reports that she is no longer using Prilosec.    Review of Systems  Constitutional: Positive for activity change, appetite change and fatigue. Negative for diaphoresis and fever.  HENT: Negative.   Eyes: Negative.   Respiratory: Positive for shortness of  breath. Negative for choking, wheezing and stridor.   Cardiovascular: Positive for leg swelling (chronic ).  Gastrointestinal: Positive for abdominal distention, abdominal pain, constipation, diarrhea, nausea and vomiting.  Endocrine: Negative.   Genitourinary: Negative.   Skin: Negative.   Neurological: Positive for syncope and light-headedness.  Hematological: Negative.   Psychiatric/Behavioral: Positive for sleep disturbance.    Past Medical History:  Diagnosis Date  . Allergic rhinitis 01/23/2016  . C. difficile colitis   . CAD (coronary artery disease)    a. s/p STEMI in 01/2015 with 95% LCx stenosis and distal 80% LCx stenosis (DESx2 placed)  . Cancer ([place])    SKIN  . Candidiasis of skin 09/30/2014  . Cirrhosis ([place])   . Depression   . Diabetes mellitus 2008  . Gout   . Herpes simplex   . Hyperglycemia 05/31/2013  . Hyperlipidemia   . Hypertension   . Myocardial infarction ([place])   . 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 Obesity   . Polycythemia    Dr. Elease Hashimoto- [facility]  . Psoriasis     Social History   Socioeconomic History  . Marital status: Married    Spouse name: Not on file  . Number of children: 2  . Years of education: 2 yr colle  . Highest education level: Not on file  Occupational History  . Occupation: housewife  Social Needs  . Financial resource strain: Not on file  . Food insecurity:    Worry: Not on file    Inability: Not on file  . Transportation needs:    Medical: Not  on file    Non-medical: Not on file  Tobacco Use  . Smoking status: Former Smoker    Years: 48.00    Types: Cigarettes    Last attempt to quit: 10/18/1986    Years since quitting: 31.2  . Smokeless tobacco: Never Used  Substance and Sexual Activity  . Alcohol use: No    Alcohol/week: 0.0 oz  . Drug use: No  . Sexual activity: Yes    Partners: Male    Birth control/protection: None  Lifestyle  . Physical activity:    Days per week: Not on file     Minutes per session: Not on file  . Stress: Not on file  Relationships  . Social connections:    Talks on phone: Not on file    Gets together: Not on file    Attends religious service: Not on file    Active member of club or organization: Not on file    Attends meetings of clubs or organizations: Not on file    Relationship status: Not on file  . Intimate partner violence:    Fear of current or ex partner: Not on file    Emotionally abused: Not on file    Physically abused: Not on file    Forced sexual activity: Not on file  Other Topics Concern  . Not on file  Social History Narrative   2 caffeine drink per day.  No regular exercise.     Retired from the bank   2 children (Daughter and a son) son has morbid obesity   2 grandchildren   3 great grandchildren    Past Surgical History:  Procedure Laterality Date  . BREAST SURGERY Left    milk duct  . CATARACT EXTRACTION, BILATERAL    . COLONOSCOPY    . LEFT HEART CATHETERIZATION WITH CORONARY ANGIOGRAM N/A 01/27/2015   Procedure: LEFT HEART CATHETERIZATION WITH CORONARY ANGIOGRAM;  Surgeon: [name], MD;  Location: [facility] CATH LAB;  Service: Cardiovascular;  Laterality: N/A;  . TONSILLECTOMY    . TOTAL ABDOMINAL HYSTERECTOMY W/ BILATERAL SALPINGOOPHORECTOMY     for heavy periods with appendectomy    Family History  Problem Relation Age of Onset  . Diabetes Mother   . Heart disease Mother        CAD  . Hyperlipidemia Mother   . Hypertension Mother   . Kidney disease Mother   . Kidney disease Father   . Breast cancer Maternal Aunt   . Heart disease Maternal Grandmother   . Heart disease Other        maternal aunts and uncles    Allergies  Allergen Reactions  . Lisinopril Cough  . Tape Hives  . Doxycycline Hives, Swelling and Rash  . Latex Hives, Itching and Rash    Current Outpatient Medications on File Prior to Visit  Medication Sig Dispense Refill  . amLODipine (NORVASC) 5 MG tablet Take 1 tablet (5 mg  total) by mouth daily. 30 tablet 1  . aspirin EC 81 MG tablet Take 81 mg by mouth daily.    . canagliflozin (INVOKANA) 100 MG TABS tablet Take 1 tablet (100 mg total) by mouth daily. 30 tablet 5  . diphenoxylate-atropine (LOMOTIL) 2.5-0.025 MG tablet Take 1-2 tablets by mouth 2 (two) times daily as needed for diarrhea or loose stools. 60 tablet 0  . Insulin Pen Needle 32G X 4 MM MISC Use to inject insulins. 100 each 3  . insulin regular human CONCENTRATED (HUMULIN R U-500 KWIKPEN)  500 UNIT/ML kwikpen Inject 100 units under the skin before breakfast and 100 units before dinner. 18 pen 1  . loperamide (IMODIUM A-D) 2 MG tablet Take 1-2 tablets (2-4 mg total) by mouth daily as needed for diarrhea or loose stools. 30 tablet 1  . LORazepam (ATIVAN) 0.5 MG tablet 1 tablet by mouth 30 minutes before MRI. May repeat x 1 if needed 2 tablet 0  . losartan (COZAAR) 100 MG tablet TAKE 1 TABLET BY MOUTH ONCE DAILY 90 tablet 1  . meloxicam (MOBIC) 7.5 MG tablet Take 1 tablet (7.5 mg total) by mouth daily. 30 tablet 2  . nitroGLYCERIN (NITROSTAT) 0.4 MG SL tablet Place 1 tablet (0.4 mg total) under the tongue every 5 (five) minutes as needed for chest pain. 25 tablet 3  . ondansetron (ZOFRAN ODT) 4 MG disintegrating tablet Take 1 tablet (4 mg total) by mouth every 8 (eight) hours as needed. 10 tablet 0  . ONETOUCH VERIO test strip Use to test blood sugar 3 times daily. 100 each 2  . oxyCODONE-acetaminophen (PERCOCET/ROXICET) 5-325 MG tablet Take 1 tablet by mouth every 4 (four) hours as needed for severe pain. 30 tablet 0  . Pancrelipase, Lip-Prot-Amyl, 24000-76000 units CPEP Take 1 capsule (24,000 Units total) by mouth 3 (three) times daily before meals. 90 capsule 1  . rosuvastatin (CRESTOR) 20 MG tablet TAKE 1 TABLET(20 MG) BY MOUTH DAILY 90 tablet 3  . ruxolitinib phosphate (JAKAFI) 25 MG tablet Take 1 tablet (25 mg total) by mouth 2 (two) times daily. 60 tablet 11  . sulfamethoxazole-trimethoprim (BACTRIM  DS,SEPTRA DS) 800-160 MG tablet Take 1 tablet by mouth 2 (two) times daily. 6 tablet 0  . promethazine (PHENERGAN) 25 MG tablet Take 0.5 tablets (12.5 mg total) by mouth every 6 (six) hours as needed for nausea or vomiting. (Patient not taking: Reported on 01/10/2018) 30 tablet 3   No current facility-administered medications on file prior to visit.     BP (!) 160/80 (BP Location: Left Arm)   Temp 97.9 F (36.6 C) (Oral)   Wt 218 lb (98.9 kg)   BMI 37.42 kg/m       Objective:   Physical Exam  Constitutional: She is oriented to person, place, and time. She appears well-developed and well-nourished. She appears ill. No distress.  Eyes: Pupils are equal, round, and reactive to light. Conjunctivae and EOM are normal. Right eye exhibits no discharge. Left eye exhibits no discharge. No scleral icterus.  Neck: Normal range of motion. Neck supple. No thyromegaly present.  Cardiovascular: Normal rate, regular rhythm, normal heart sounds and intact distal pulses. Exam reveals no gallop and no friction rub.  No murmur heard. Pulmonary/Chest: Effort normal. No respiratory distress. She has no wheezes. She has no rhonchi. She has rales in the right middle field and the left middle field. She exhibits no tenderness.  Abdominal: Soft. Normal appearance. She exhibits distension. She exhibits no mass. There is tenderness in the left upper quadrant and left lower quadrant. There is no rebound, no guarding and no CVA tenderness.  Musculoskeletal: Normal range of motion. She exhibits no edema, tenderness or deformity.  Lymphadenopathy:    She has no cervical adenopathy.  Neurological: She is alert and oriented to person, place, and time.  Skin: Skin is warm and dry. No rash noted. She is not diaphoretic. No erythema. No pallor.  Psychiatric: She has a normal mood and affect. Her behavior is normal. Judgment and thought content normal.  Nursing note and vitals  reviewed.     Assessment & Plan:  1. Nausea  vomiting and diarrhea - likely viral. I do not think her cirrhosis is causing her symptoms. Lipase was normal 6 days ago. Will check A1c to see her diabetes is becoming worse.  - Basic Metabolic Panel - CBC with Differential/Platelet - Hemoglobin A1c - Stool culture - Ambulatory referral to Gastroenterology - Urinalysis, Routine w reflex microscopic  2. Bilateral rales  - DG Chest 2 View; Future  3. Type 2 diabetes mellitus with diabetic polyneuropathy, with long-term current use of insulin (HCC)  - CBC with Differential/Platelet - Hemoglobin A1c  4. Irritable bowel syndrome with diarrhea  - Ambulatory referral to Gastroenterology   [name], NP

## 2018-01-10 NOTE — Progress Notes (Signed)
Subjective:    Patient ID: Amanda [name], female    DOB: 1941-06-26, 77 y.o.   MRN: [id number]  HPI Patient is following up from an ED visit on 3/20 for nausea and vomiting and diarrhea that was persistent.  She had called EMS out to the house the prior day for the same symptoms. Since that time she has continued to have nausea, vomiting, constipation and diarrhea alternating.  She has had more fatigue than usual and is sleeping throughout the day but not sleeping as usual at night.  Last bout of diarrhea was 2 days ago.  Nausea this morning.  She did have a formed bowel movement this morning. Her appetite is still poor but has improved.  She is able to drink water and some ginger-ale but is unable to tolerate any electrolyte replacement fluids.  She has chronic issues with nausea, constipation and diarrhea. She has a history of C. Diff in the past without any recent issues.  She was treated with antibiotics 3 weeks prior to onset of symptoms.  She is more distended in the abdomen with abdominal pain continued. She denies fever or chills, blood in her stool or urinary symptoms. She is still taking Zofran on a regular basis for nausea.    Review of Systems  Constitutional: Positive for appetite change and fatigue. Negative for chills and fever.  Respiratory: Positive for shortness of breath. Negative for cough and chest tightness.   Cardiovascular: Negative for chest pain.  Gastrointestinal: Positive for abdominal distention, abdominal pain, constipation, diarrhea, nausea and vomiting. Negative for blood in stool.  Genitourinary: Negative for dysuria, flank pain, frequency, hematuria and urgency.  Musculoskeletal: Positive for back pain.  Neurological: Positive for syncope, weakness, light-headedness and headaches.      Past Medical History:  Diagnosis Date  . Allergic rhinitis 01/23/2016  . C. difficile colitis   . CAD (coronary artery disease)    a. s/p STEMI in 01/2015 with 95% LCx stenosis  and distal 80% LCx stenosis (DESx2 placed)  . Cancer ([place])    SKIN  . Candidiasis of skin 09/30/2014  . Cirrhosis ([place])   . Depression   . Diabetes mellitus 2008  . Gout   . Herpes simplex   . Hyperglycemia 05/31/2013  . Hyperlipidemia   . Hypertension   . Myocardial infarction ([place])   . 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 Obesity   . Polycythemia    Dr. Elease Hashimoto- [facility]  . Psoriasis     Social History   Socioeconomic History  . Marital status: Married    Spouse name: Not on file  . Number of children: 2  . Years of education: 2 yr colle  . Highest education level: Not on file  Occupational History  . Occupation: housewife  Social Needs  . Financial resource strain: Not on file  . Food insecurity:    Worry: Not on file    Inability: Not on file  . Transportation needs:    Medical: Not on file    Non-medical: Not on file  Tobacco Use  . Smoking status: Former Smoker    Years: 48.00    Types: Cigarettes    Last attempt to quit: 10/18/1986    Years since quitting: 31.2  . Smokeless tobacco: Never Used  Substance and Sexual Activity  . Alcohol use: No    Alcohol/week: 0.0 oz  . Drug use: No  . Sexual activity: Yes    Partners: Male  Birth control/protection: None  Lifestyle  . Physical activity:    Days per week: Not on file    Minutes per session: Not on file  . Stress: Not on file  Relationships  . Social connections:    Talks on phone: Not on file    Gets together: Not on file    Attends religious service: Not on file    Active member of club or organization: Not on file    Attends meetings of clubs or organizations: Not on file    Relationship status: Not on file  . Intimate partner violence:    Fear of current or ex partner: Not on file    Emotionally abused: Not on file    Physically abused: Not on file    Forced sexual activity: Not on file  Other Topics Concern  . Not on file  Social History Narrative   2 caffeine drink per  day.  No regular exercise.     Retired from the bank   2 children (Daughter and a son) son has morbid obesity   2 grandchildren   3 great grandchildren    Past Surgical History:  Procedure Laterality Date  . BREAST SURGERY Left    milk duct  . CATARACT EXTRACTION, BILATERAL    . COLONOSCOPY    . LEFT HEART CATHETERIZATION WITH CORONARY ANGIOGRAM N/A 01/27/2015   Procedure: LEFT HEART CATHETERIZATION WITH CORONARY ANGIOGRAM;  Surgeon: [name], MD;  Location: [facility] CATH LAB;  Service: Cardiovascular;  Laterality: N/A;  . TONSILLECTOMY    . TOTAL ABDOMINAL HYSTERECTOMY W/ BILATERAL SALPINGOOPHORECTOMY     for heavy periods with appendectomy    Family History  Problem Relation Age of Onset  . Diabetes Mother   . Heart disease Mother        CAD  . Hyperlipidemia Mother   . Hypertension Mother   . Kidney disease Mother   . Kidney disease Father   . Breast cancer Maternal Aunt   . Heart disease Maternal Grandmother   . Heart disease Other        maternal aunts and uncles    Allergies  Allergen Reactions  . Lisinopril Cough  . Tape Hives  . Doxycycline Hives, Swelling and Rash  . Latex Hives, Itching and Rash    Current Outpatient Medications on File Prior to Visit  Medication Sig Dispense Refill  . amLODipine (NORVASC) 5 MG tablet Take 1 tablet (5 mg total) by mouth daily. 30 tablet 1  . aspirin EC 81 MG tablet Take 81 mg by mouth daily.    . canagliflozin (INVOKANA) 100 MG TABS tablet Take 1 tablet (100 mg total) by mouth daily. 30 tablet 5  . diphenoxylate-atropine (LOMOTIL) 2.5-0.025 MG tablet Take 1-2 tablets by mouth 2 (two) times daily as needed for diarrhea or loose stools. 60 tablet 0  . Insulin Pen Needle 32G X 4 MM MISC Use to inject insulins. 100 each 3  . insulin regular human CONCENTRATED (HUMULIN R U-500 KWIKPEN) 500 UNIT/ML kwikpen Inject 100 units under the skin before breakfast and 100 units before dinner. 18 pen 1  . loperamide (IMODIUM A-D) 2 MG  tablet Take 1-2 tablets (2-4 mg total) by mouth daily as needed for diarrhea or loose stools. 30 tablet 1  . LORazepam (ATIVAN) 0.5 MG tablet 1 tablet by mouth 30 minutes before MRI. May repeat x 1 if needed 2 tablet 0  . losartan (COZAAR) 100 MG tablet TAKE 1 TABLET BY MOUTH ONCE  DAILY 90 tablet 1  . meloxicam (MOBIC) 7.5 MG tablet Take 1 tablet (7.5 mg total) by mouth daily. 30 tablet 2  . nitroGLYCERIN (NITROSTAT) 0.4 MG SL tablet Place 1 tablet (0.4 mg total) under the tongue every 5 (five) minutes as needed for chest pain. 25 tablet 3  . ondansetron (ZOFRAN ODT) 4 MG disintegrating tablet Take 1 tablet (4 mg total) by mouth every 8 (eight) hours as needed. 10 tablet 0  . ONETOUCH VERIO test strip Use to test blood sugar 3 times daily. 100 each 2  . oxyCODONE-acetaminophen (PERCOCET/ROXICET) 5-325 MG tablet Take 1 tablet by mouth every 4 (four) hours as needed for severe pain. 30 tablet 0  . Pancrelipase, Lip-Prot-Amyl, 24000-76000 units CPEP Take 1 capsule (24,000 Units total) by mouth 3 (three) times daily before meals. 90 capsule 1  . rosuvastatin (CRESTOR) 20 MG tablet TAKE 1 TABLET(20 MG) BY MOUTH DAILY 90 tablet 3  . ruxolitinib phosphate (JAKAFI) 25 MG tablet Take 1 tablet (25 mg total) by mouth 2 (two) times daily. 60 tablet 11  . sulfamethoxazole-trimethoprim (BACTRIM DS,SEPTRA DS) 800-160 MG tablet Take 1 tablet by mouth 2 (two) times daily. 6 tablet 0  . promethazine (PHENERGAN) 25 MG tablet Take 0.5 tablets (12.5 mg total) by mouth every 6 (six) hours as needed for nausea or vomiting. (Patient not taking: Reported on 01/10/2018) 30 tablet 3   No current facility-administered medications on file prior to visit.     BP (!) 160/80 (BP Location: Left Arm)   Temp 97.9 F (36.6 C) (Oral)   Wt 218 lb (98.9 kg)   BMI 37.42 kg/m   Recheck of BP at rest is 160/68. Objective:   Physical Exam  Constitutional: She is oriented to person, place, and time. She appears well-developed and  well-nourished. No distress.  Neck: No JVD present.  Cardiovascular: Normal rate, regular rhythm, normal heart sounds and intact distal pulses. Exam reveals no gallop and no friction rub.  No murmur heard. Pulmonary/Chest: Effort normal. No respiratory distress. She has no wheezes. She has rales.  Abdominal: She exhibits distension and ascites. She exhibits no shifting dullness, no pulsatile liver, no fluid wave and no mass. Bowel sounds are decreased. There is tenderness in the right lower quadrant and left lower quadrant. There is no rigidity, no rebound, no guarding and no CVA tenderness.  Musculoskeletal: She exhibits no edema.  Weakness and ambulatory difficulty observed in the exam room.   Neurological: She is alert and oriented to person, place, and time.  Skin: Skin is warm and dry. She is not diaphoretic.  Nursing note and vitals reviewed.     Assessment & Plan:  1. Nausea vomiting and diarrhea Will check lab work and diagnostic studies and notify of results and plan.  - Basic Metabolic Panel - CBC with Differential/Platelet - Hemoglobin A1c - Stool culture - Ambulatory referral to Gastroenterology - Urinalysis, Routine w reflex microscopic - DG Chest 2 View; Future  Follow up to be determined. Paiton Fosco C Jerrilyn Messinger BSN RN NP student

## 2018-01-10 NOTE — Telephone Encounter (Signed)
Santiago Glad @ Lab called with CRITICAL RESULT   WBC 28.8  Cory notified and states this is a known condition for this pt. Nothing further needed.

## 2018-01-10 NOTE — Addendum Note (Signed)
Addended by: Elmer Picker on: 01/10/2018 03:50 PM   Modules accepted: Orders

## 2018-01-11 ENCOUNTER — Telehealth: Payer: Self-pay | Admitting: Nutrition

## 2018-01-11 LAB — URINE CULTURE
MICRO NUMBER:: 90376251
SPECIMEN QUALITY: ADEQUATE

## 2018-01-11 NOTE — Telephone Encounter (Signed)
Patient notified that medication has been sent From [facility], and is in our refrigerator

## 2018-01-24 ENCOUNTER — Telehealth: Payer: Self-pay | Admitting: Internal Medicine

## 2018-01-24 NOTE — Telephone Encounter (Signed)
Patient needs a new prescription sent in for the [place] meter and test strips.     [facility] [phone] - [place], [address] AT [address]

## 2018-01-25 MED ORDER — GLUCOSE BLOOD VI STRP: ORAL_STRIP | 12 refills | Status: AC

## 2018-01-25 MED ORDER — ACCU-CHEK GUIDE W/DEVICE KIT: 1.0000 | PACK | 0 refills | Status: AC

## 2018-01-25 NOTE — Telephone Encounter (Signed)
Called pt. Spoke to spouse. Informed Rx sent as requested.

## 2018-01-31 ENCOUNTER — Encounter: Payer: Self-pay | Admitting: Physician Assistant

## 2018-01-31 ENCOUNTER — Other Ambulatory Visit (INDEPENDENT_AMBULATORY_CARE_PROVIDER_SITE_OTHER): Payer: PPO

## 2018-01-31 ENCOUNTER — Ambulatory Visit (INDEPENDENT_AMBULATORY_CARE_PROVIDER_SITE_OTHER): Payer: PPO | Admitting: Physician Assistant

## 2018-01-31 VITALS — BP 164/80 | HR 92 | Ht 63.0 in | Wt 214.2 lb

## 2018-01-31 DIAGNOSIS — I8500 Esophageal varices without bleeding: Secondary | ICD-10-CM | POA: Diagnosis not present

## 2018-01-31 DIAGNOSIS — R188 Other ascites: Secondary | ICD-10-CM | POA: Diagnosis not present

## 2018-01-31 DIAGNOSIS — K7460 Unspecified cirrhosis of liver: Secondary | ICD-10-CM

## 2018-01-31 DIAGNOSIS — K760 Fatty (change of) liver, not elsewhere classified: Secondary | ICD-10-CM | POA: Diagnosis not present

## 2018-01-31 DIAGNOSIS — R161 Splenomegaly, not elsewhere classified: Secondary | ICD-10-CM | POA: Diagnosis not present

## 2018-01-31 LAB — CBC WITH DIFFERENTIAL/PLATELET
BASOS ABS: 0.3 10*3/uL — AB (ref 0.0–0.1)
Basophils Relative: 1 % (ref 0.0–3.0)
EOS PCT: 2.8 % (ref 0.0–5.0)
Eosinophils Absolute: 0.9 10*3/uL — ABNORMAL HIGH (ref 0.0–0.7)
HCT: 41.7 % (ref 36.0–46.0)
HEMOGLOBIN: 13.8 g/dL (ref 12.0–15.0)
LYMPHS PCT: 6 % — AB (ref 12.0–46.0)
Lymphs Abs: 1.9 10*3/uL (ref 0.7–4.0)
MCHC: 33 g/dL (ref 30.0–36.0)
MCV: 89.5 fl (ref 78.0–100.0)
MONOS PCT: 3.7 % (ref 3.0–12.0)
Monocytes Absolute: 1.2 10*3/uL — ABNORMAL HIGH (ref 0.1–1.0)
Neutro Abs: 26.9 10*3/uL — ABNORMAL HIGH (ref 1.4–7.7)
Neutrophils Relative %: 86.5 % — ABNORMAL HIGH (ref 43.0–77.0)
Platelets: 688 10*3/uL — ABNORMAL HIGH (ref 150.0–400.0)
RBC: 4.66 Mil/uL (ref 3.87–5.11)
RDW: 17.2 % — ABNORMAL HIGH (ref 11.5–15.5)

## 2018-01-31 LAB — COMPREHENSIVE METABOLIC PANEL
ALT: 10 U/L (ref 0–35)
AST: 27 U/L (ref 0–37)
Albumin: 3.9 g/dL (ref 3.5–5.2)
Alkaline Phosphatase: 118 U/L — ABNORMAL HIGH (ref 39–117)
BUN: 13 mg/dL (ref 6–23)
CHLORIDE: 104 meq/L (ref 96–112)
CO2: 26 meq/L (ref 19–32)
Calcium: 9 mg/dL (ref 8.4–10.5)
Creatinine, Ser: 0.91 mg/dL (ref 0.40–1.20)
GFR: 63.48 mL/min (ref >60.00)
GLUCOSE: 227 mg/dL — AB (ref 70–99)
POTASSIUM: 3.9 meq/L (ref 3.5–5.1)
Sodium: 138 mEq/L (ref 135–145)
Total Bilirubin: 1.1 mg/dL (ref 0.2–1.2)
Total Protein: 7 g/dL (ref 6.0–8.3)

## 2018-01-31 LAB — AMMONIA: Ammonia: 59 umol/L — ABNORMAL HIGH (ref 11–35)

## 2018-01-31 LAB — PROTIME-INR
INR: 1.3 ratio — ABNORMAL HIGH (ref 0.8–1.0)
PROTHROMBIN TIME: 13.6 s — AB (ref 9.6–13.1)

## 2018-01-31 NOTE — Progress Notes (Signed)
Subjective:    Patient ID: Amanda [name], female    DOB: 12-Jan-1941, 77 y.o.   MRN: [id number]  HPI [name] is a pleasant 77 year old white female, known to Dr. [name] who is referred today by [name] NP for follow-up of cirrhosis.  Patient was last seen by GI in 2016. 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  She also has prior history of C. difficile. She has diagnosis of cirrhosis felt secondary to nonalcoholic fatty liver disease. Last colonoscopy was done in 2012 per Dr. Oletta Lamas she had 4 small polyps removed, path was consistent with tubular adenomas. EGD was done in June 2015, she had no varices, she did have mild diffuse gastritis and a small hiatal hernia.  Amanda [name] Patient has had problems over the past several years with chronic diarrhea with 2-4 bowel movements per day.  She manages this with as needed Lomotil.  She says she has not had any recent changes in her chronic issues. She had an ER visit in March with complaints of nausea vomiting and diarrhea.  She says the nausea and vomiting have resolved.  Her diarrhea has not been any different than she has been over the past few years. Appetite has been fine, weight has been stable.  Primary care felt that she had some increase in abdominal girth but she says that her abdomen is not any larger than it has been for some time.  She has no complaints of abdominal pain.  She has not been retaining any fluid peripherally. CT of the abdomen and pelvis on 01/04/2018 showed an enlarged spleen, diverticulosis, a cirrhotic liver, and a small left inguinal hernia containing fat.  There was some scattered ascites Chest x-ray 01/10/2018 progression of diffuse reticulonodular  lung markings suggestive of chronic lung disease. Most recent labs WBC 13,000, hemoglobin 14.8 hematocrit of 43.2, platelets 509, sodium 134, creatinine 0.7, LFTs within normal limits  Review of Systems Pertinent positive and negative review of systems were noted in the above HPI section.  All other review of systems was otherwise negative.  Outpatient Encounter Medications as of 01/31/2018  Medication Sig  . amLODipine (NORVASC) 5 MG tablet Take 1 tablet (5 mg total) by mouth daily.  Marland Kitchen aspirin EC 81 MG tablet Take 81 mg by mouth daily.  . Blood Glucose Monitoring Suppl (ACCU-CHEK GUIDE) w/Device KIT 1 kit by Does not apply route as directed.  . diphenoxylate-atropine (LOMOTIL) 2.5-0.025 MG tablet Take 1-2 tablets by mouth 2 (two) times daily as needed for diarrhea or loose stools.  Marland Kitchen glucose blood (ACCU-CHEK GUIDE) test strip Use to check blood sugars 3 times daily  . Insulin Pen Needle 32G X 4 MM MISC Use to inject insulins.  . insulin regular human CONCENTRATED (HUMULIN R U-500 KWIKPEN) 500 UNIT/ML kwikpen Inject 100 units under the skin before breakfast and 100 units before dinner.  . loperamide (IMODIUM A-D) 2 MG tablet Take 1-2 tablets (2-4 mg total) by mouth daily as needed for diarrhea or loose stools.  Marland Kitchen LORazepam (ATIVAN) 0.5 MG tablet 1 tablet by mouth 30 minutes before MRI. May repeat x 1 if needed  . losartan (COZAAR) 100 MG tablet TAKE 1 TABLET BY MOUTH ONCE DAILY  . meloxicam (MOBIC) 7.5 MG tablet Take 1 tablet (7.5 mg total)  by mouth daily.  . ondansetron (ZOFRAN ODT) 4 MG disintegrating tablet Take 1 tablet (4 mg total) by mouth every 8 (eight) hours as needed.  Marland Kitchen oxyCODONE-acetaminophen (PERCOCET/ROXICET) 5-325 MG tablet Take 1 tablet by mouth every 4 (four) hours as needed for severe pain.  . promethazine (PHENERGAN) 25 MG tablet Take 0.5 tablets (12.5 mg total) by mouth every 6 (six) hours as needed for nausea or vomiting.  . rosuvastatin (CRESTOR) 20 MG tablet TAKE 1  TABLET(20 MG) BY MOUTH DAILY  . ruxolitinib phosphate (JAKAFI) 25 MG tablet Take 1 tablet (25 mg total) by mouth 2 (two) times daily.  . nitroGLYCERIN (NITROSTAT) 0.4 MG SL tablet Place 1 tablet (0.4 mg total) under the tongue every 5 (five) minutes as needed for chest pain. (Patient not taking: Reported on 01/31/2018)  . [DISCONTINUED] canagliflozin (INVOKANA) 100 MG TABS tablet Take 1 tablet (100 mg total) by mouth daily.  . [DISCONTINUED] Pancrelipase, Lip-Prot-Amyl, 24000-76000 units CPEP Take 1 capsule (24,000 Units total) by mouth 3 (three) times daily before meals.  . [DISCONTINUED] sulfamethoxazole-trimethoprim (BACTRIM DS,SEPTRA DS) 800-160 MG tablet Take 1 tablet by mouth 2 (two) times daily.   No facility-administered encounter medications on file as of 01/31/2018.    Allergies  Allergen Reactions  . Lisinopril Cough  . Tape Hives  . Doxycycline Hives, Swelling and Rash  . Latex Hives, Itching and Rash   Patient Active Problem List   Diagnosis Date Noted  . Hip injury, left, subsequent encounter 11/08/2017  . Iron deficiency anemia 12/22/2016  . OSA (obstructive sleep apnea) 04/21/2016  . Osteopenia 04/07/2016  . Lung nodule, solitary 02/05/2016  . Aortic dilatation ([place]) 02/05/2016  . Dyspnea 01/28/2016  . Allergic rhinitis 01/23/2016  . Hepatic cirrhosis ([place]) 10/28/2015  . Irritable bowel syndrome 08/12/2015  . Obesity (BMI 30-39.9) 04/30/2015  . Diabetic retinopathy of both eyes ([place]) 04/18/2015  . Former smoker 04/18/2015  . GAD (generalized anxiety disorder) 04/18/2015  . Status post primary angioplasty with coronary stent 04/18/2015  . Coronary artery disease involving native coronary artery 03/01/2015  . Acute diastolic heart failure ([place]) 02/01/2015  . ST elevation myocardial infarction (STEMI) involving left circumflex coronary artery in recovery phase ([place]) 01/27/2015  . Idioventricular rhythm ([place])   . Diabetic peripheral neuropathy associated with type 2  diabetes mellitus ([place]) 01/23/2015  . Type 2 diabetes mellitus with diabetic polyneuropathy ([place]) 01/23/2015  . Thrombocytosis ([place]) 07/04/2014  . Cholelithiasis 02/07/2014  . Chronic diarrhea 01/29/2014  . [name] ([place]) 05/31/2013  . Essential hypertension 05/31/2013  . History of Bell's palsy 05/26/2013  . DERMATITIS, ATOPIC 11/16/2010  . DIZZINESS 11/16/2010  . DIASTOLIC DYSFUNCTION 17/51/0258  . Hyperlipidemia 12/15/2009  . Essential hypertension, benign 11/24/2009  . PSORIASIS 11/24/2009  . Depression 09/13/2009  . 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[place]) 07/08/2008   Social History   Socioeconomic History  . Marital status: Married    Spouse name: Not on file  . Number of children: 2  . Years of education: 2 yr colle  . Highest education level: Not on file  Occupational History  . Occupation: housewife  Social Needs  . Financial resource strain: Not on file  . Food insecurity:    Worry: Not on file    Inability: Not on file  . Transportation needs:    Medical: Not on file    Non-medical: Not on file  Tobacco Use  . Smoking status: Former Smoker    Years: 48.00    Types: Cigarettes  Last attempt to quit: 10/18/1986    Years since quitting: 31.3  . Smokeless tobacco: Never Used  Substance and Sexual Activity  . Alcohol use: No    Alcohol/week: 0.0 oz  . Drug use: No  . Sexual activity: Yes    Partners: Male    Birth control/protection: None  Lifestyle  . Physical activity:    Days per week: Not on file    Minutes per session: Not on file  . Stress: Not on file  Relationships  . Social connections:    Talks on phone: Not on file    Gets together: Not on file    Attends religious service: Not on file    Active member of club or organization: Not on file    Attends meetings of clubs or organizations: Not on file    Relationship status: Not on file  . Intimate partner violence:    Fear of current or ex partner: Not on file     Emotionally abused: Not on file    Physically abused: Not on file    Forced sexual activity: Not on file  Other Topics Concern  . Not on file  Social History Narrative   2 caffeine drink per day.  No regular exercise.     Retired from the bank   2 children (Daughter and a son) son has morbid obesity   2 grandchildren   3 great grandchildren    Ms. Armand's family history includes Breast cancer in her maternal aunt; Diabetes in her mother; Heart disease in her maternal grandmother, mother, and other; Hyperlipidemia in her mother; Hypertension in her mother; Kidney disease in her father and mother.      Objective:    Vitals:   01/31/18 1021  BP: (!) 164/80  Pulse: 92    Physical Exam; well-developed elderly white female in no acute distress, accompanied by her husband both very pleasant blood pressure 164/80 pulse 92, height 5 foot 3, weight 214, BMI 37.9.  HEENT; nontraumatic normocephalic EOMI PERRLA sclera anicteric, Cardiovascular ;regular rate and rhythm with S1-S2 no murmur rub or gallop, Pulmonary ;clear bilaterally, Abdomen; large, protuberant, nontender no definite fluid wave no palpable hepatomegaly, spleen tip is palpable in the left mid abdomen bowel sounds are present, Rectal ;;exam not done, Extremities no clubbing cyanosis or edema, Neuro psych ;mood and affect appropriate no asterixis       Assessment & Plan:   #47 77 year old white female with cirrhosis, secondary to nonalcoholic fatty liver disease .cirrhosis  has been compensated.  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  [name] and myeloproliferative disorder- #9 chronic lung disease  Plan; patient follows a low-sodium diet, continue 2 g  sodium diet Check pro time/INR, venous ammonia and alpha-fetoprotein Up-to-date with imaging surveillance-CT scan done 01/04/2018 Patient should have follow-up upper endoscopy for variceal surveillance. Ideally she should also have follow-up colonoscopy Patient does not want to schedule any procedures until July, due to other vacation plans etc. She was advised today to call back in about a month to be scheduled with Dr. [name].  Today we discussed EGD.  After reviewing her prior colonoscopy report after she left the office, realized that she should have follow-up colonoscopy which hopefully she can be scheduled for at the same time. Braelyn Bordonaro S  Vaiden Adames PA-C 01/31/2018   Cc: [name], NP

## 2018-01-31 NOTE — Patient Instructions (Signed)
Please go to the basement level to have your labs drawn.  Try to follow a low sodium diet.  Please call back early part of May to speak to Dr. [name] Nurse, Patty regarding scheduling an EGD ( Endoscopy)  Cooking With Less Salt Cooking with less salt is one way to reduce the amount of sodium you get from food. Depending on your condition and overall health, your health care provider or diet and nutrition specialist (dietitian) may recommend that you reduce your sodium intake. Most people should have less than 2,300 milligrams (mg) of sodium each day. If you have high blood pressure (hypertension), you may need to limit your sodium to 1,500 mg each day. Follow the tips below to help reduce your sodium intake. What do I need to know about cooking with less salt? Shopping  Buy sodium-free or low-sodium products. Look for the following words on food labels: ? Low-sodium. ? Sodium-free. ? Reduced-sodium. ? No salt added. ? Unsalted.  Buy fresh or frozen vegetables. Avoid canned vegetables.  Avoid buying meats or protein foods that have been injected with broth or saline solution.  Avoid cured or smoked meats, such as hot dogs, bacon, salami, ham, and bologna. Reading food labels  Check the food label before buying or using packaged ingredients.  Look for products with no more than 140 mg of sodium in one serving.  Do not choose foods with salt as one of the first three ingredients on the ingredients list. If salt is one of the first three ingredients, it usually means the item is high in sodium, because ingredients are listed in order of amount in the food item. Cooking  Use herbs, seasonings without salt, and spices as substitutes for salt in foods.  Use sodium-free baking soda when baking.  Grill, braise, or roast foods to add flavor with less salt.  Avoid adding salt to pasta, rice, or hot cereals while cooking.  Drain and rinse canned vegetables before use.  Avoid adding salt  when cooking sweets and desserts.  Cook with low-sodium ingredients. What are some salt alternatives? The following are herbs, seasonings, and spices that can be used instead of salt to give taste to your food. Herbs should be fresh or dried. Do not choose packaged mixes. Next to the name of the herb, spice, or seasoning are some examples of foods you can pair it with. Herbs  Bay leaves - Soups, meat and vegetable dishes, and spaghetti sauce.  Basil - [employer], soups, pasta, and fish dishes.  Cilantro - Meat, poultry, and vegetable dishes.  Chili powder - Marinades and Mexican dishes.  Chives - Salad dressings and potato dishes.  Cumin - Mexican dishes, couscous, and meat dishes.  Dill - Fish dishes, sauces, and salads.  Fennel - Meat and vegetable dishes, breads, and cookies.  Garlic (do not use garlic salt) - [place] dishes, meat dishes, salad dressings, and sauces.  Marjoram - Soups, potato dishes, and meat dishes.  Oregano - Pizza and spaghetti sauce.  Parsley - Salads, soups, pasta, and meat dishes.  Rosemary - [place] dishes, salad dressings, soups, and red meats.  Saffron - Fish dishes, pasta, and some poultry dishes.  Sage - Stuffings and sauces.  Tarragon - Fish and [employer].  Thyme - Stuffing, meat, and fish dishes. Seasonings  Lemon juice - Fish dishes, poultry dishes, vegetables, and salads.  Vinegar - Salad dressings, vegetables, and fish dishes. Spices  Cinnamon - Sweet dishes, such as cakes, cookies, and puddings.  Cloves - Gingerbread, puddings, and marinades for meats.  Curry - Vegetable dishes, fish and poultry dishes, and stir-fry dishes.  Ginger - Vegetables dishes, fish dishes, and stir-fry dishes.  Nutmeg - Pasta, vegetables, poultry, fish dishes, and custard. What are some low-sodium ingredients and foods?  Fresh or frozen fruits and vegetables with no sauce added.  Fresh or frozen whole meats, poultry, and fish with no  sauce added.  Eggs.  Noodles, pasta, quinoa, rice.  Shredded or puffed wheat or puffed rice.  Regular or quick oats.  Milk, yogurt, hard cheeses, and low-sodium cheeses. Good cheese choices include Swiss, [place]. Always check the label for the serving size and sodium content.  Unsalted butter or margarine.  Unsalted nuts.  Sherbet or ice cream (keep to  cup per serving).  Homemade pudding.  Sodium-free baking soda and baking powder. This is not a complete list of low-sodium ingredients and foods. Contact your dietitian for more options. Summary  Cooking with less salt is one way to reduce the amount of sodium that you get from food.  Buy sodium-free or low-sodium products.  Check the food label before using or buying packaged ingredients.  Use herbs, seasonings without salt, and spices as substitutes for salt in foods. This information is not intended to replace advice given to you by your health care provider. Make sure you discuss any questions you have with your health care provider. Document Released: 10/04/2005 Document Revised: 10/12/2016 Document Reviewed: 10/12/2016 Elsevier Interactive Patient Education  2017 [employer].  at the hospital.

## 2018-02-01 LAB — AFP TUMOR MARKER: AFP TUMOR MARKER: 4.7 ng/mL

## 2018-02-02 NOTE — Progress Notes (Signed)
I agree with the above note, plan 

## 2018-02-08 IMAGING — US US ABDOMEN LIMITED
1 series · 6 of 6 positions shown · non-contrast
Comparison: Abdominal ultrasound performed 09/23/2015

CLINICAL DATA: Assess for ascites. Current history of hepatic
cirrhosis. Initial encounter.

EXAM:
LIMITED ABDOMEN ULTRASOUND FOR ASCITES
TECHNIQUE: Limited ultrasound survey for ascites was performed in all four
abdominal quadrants.

[Series 1: us abdomen limited · 0.31mm/px · 6 of 6 slices shown]
[im 1/6]
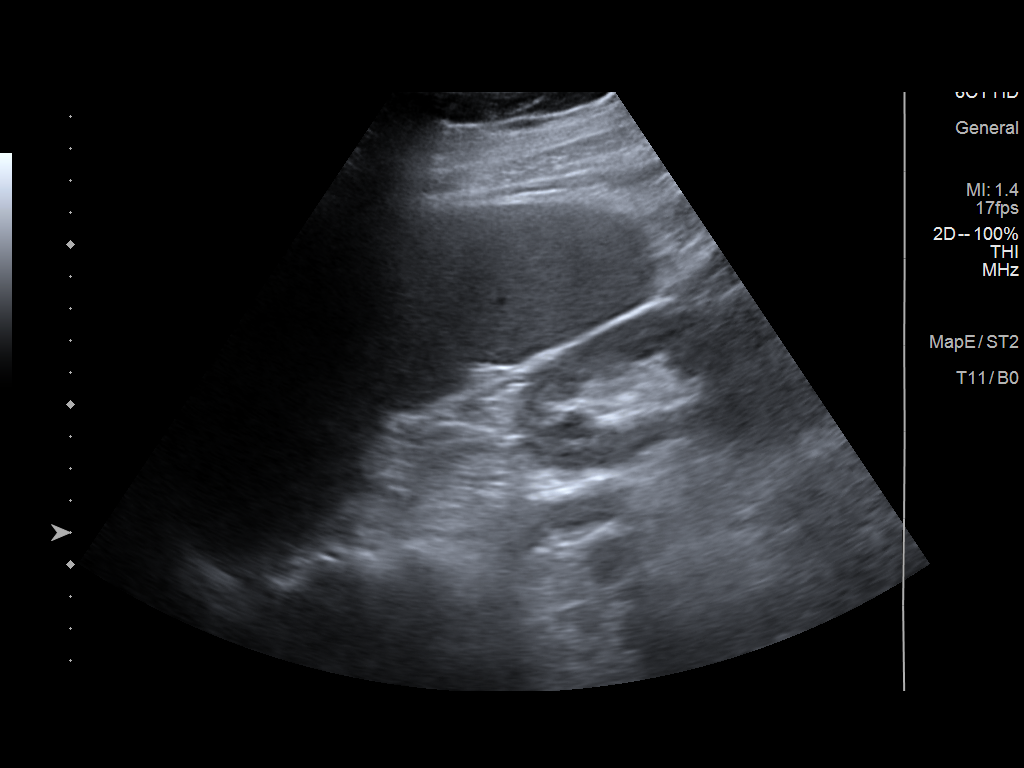
[im 2/6]
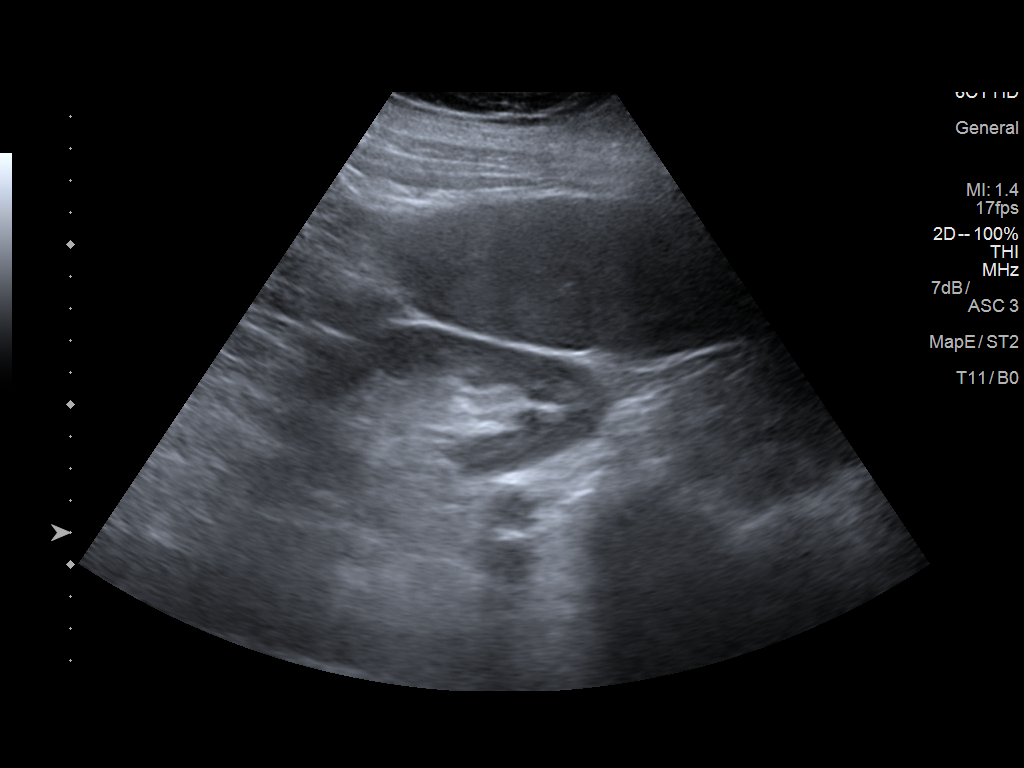
[im 3/6]
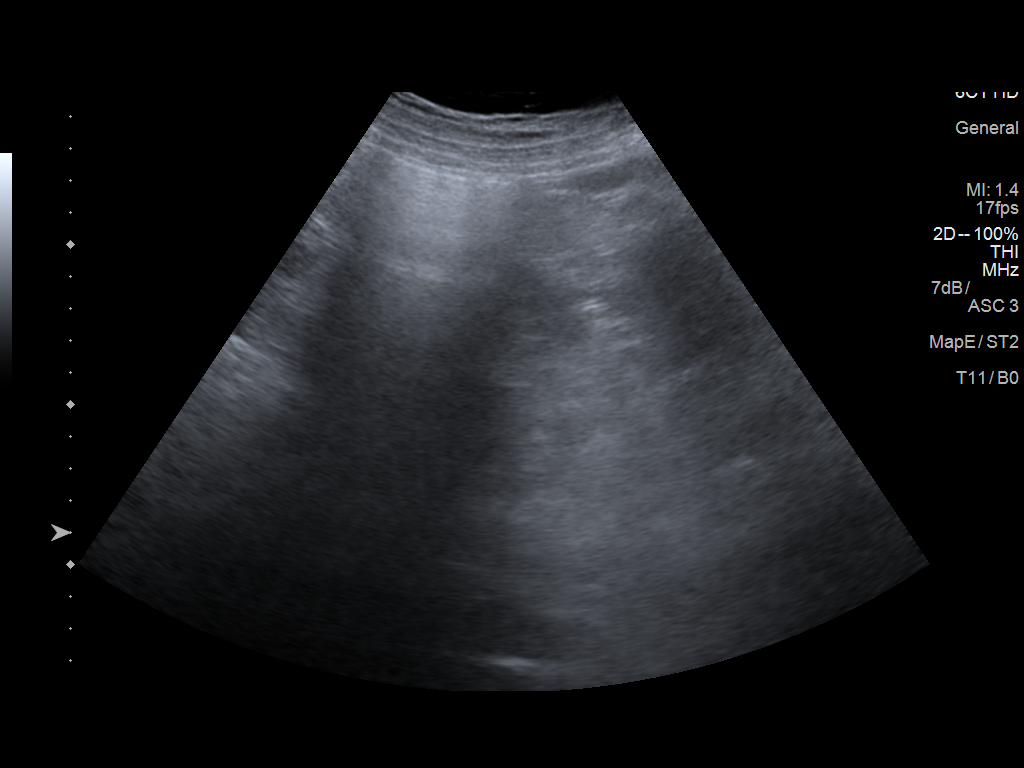
[im 4/6]
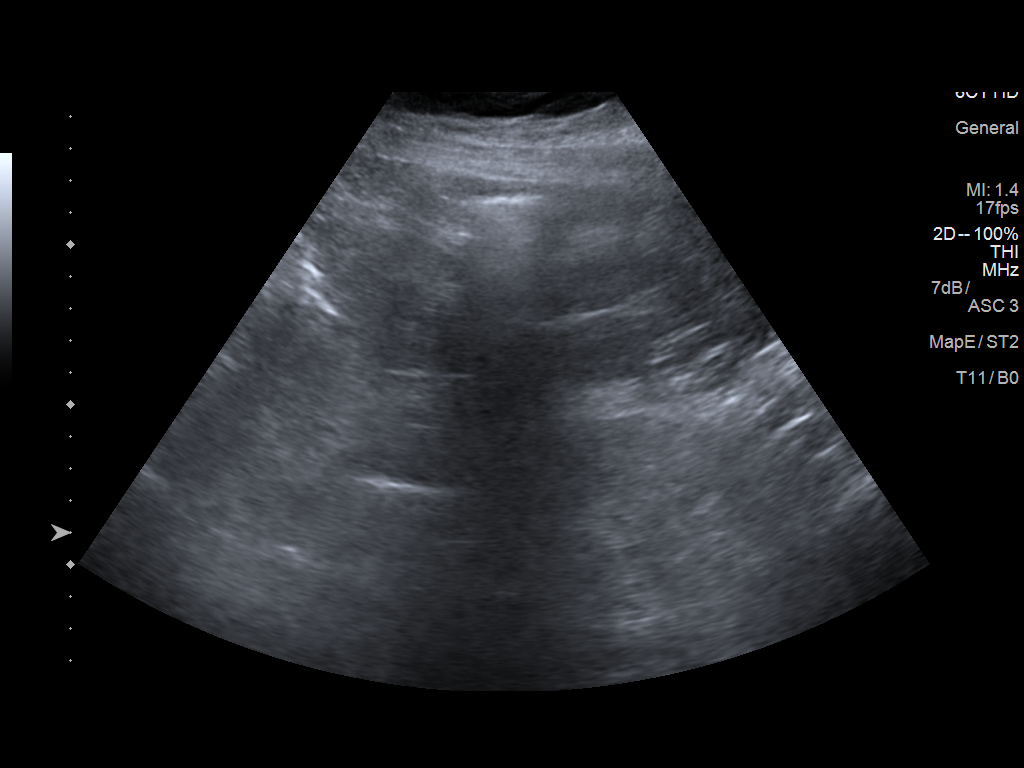
[im 5/6]
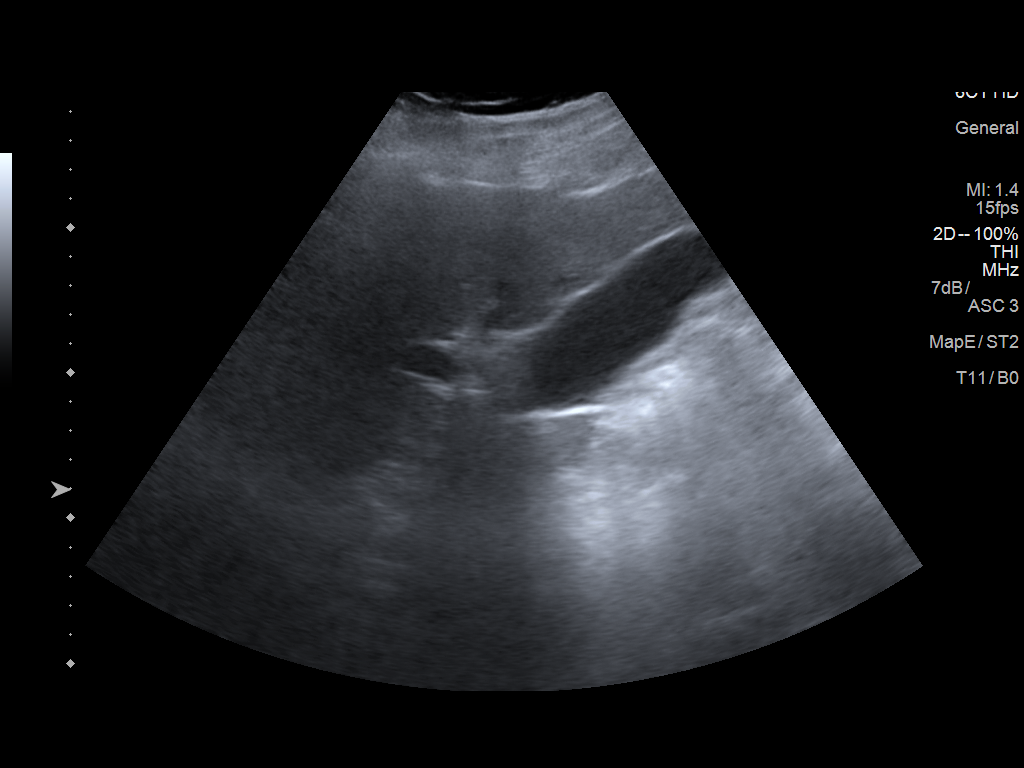
[im 6/6]
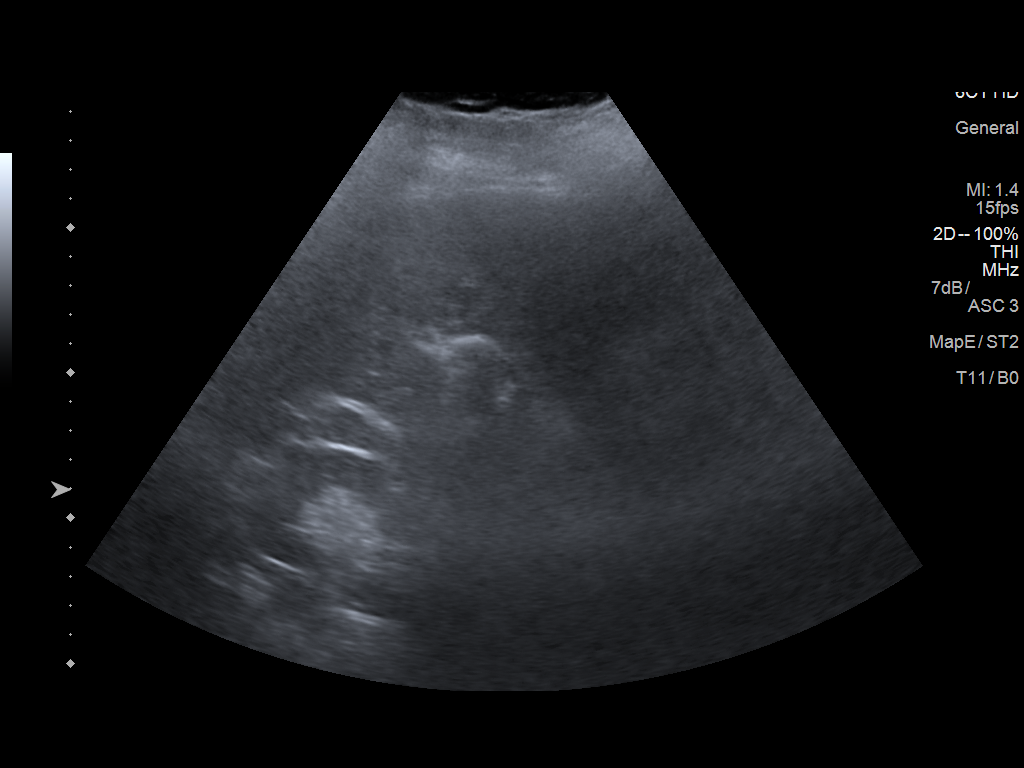

[6 of 6 positions shown; findings below may reference images not displayed]

FINDINGS: There is no evidence of ascites within the 4 quadrants of the
abdomen.
IMPRESSION: No evidence of ascites.

## 2018-02-23 ENCOUNTER — Ambulatory Visit (INDEPENDENT_AMBULATORY_CARE_PROVIDER_SITE_OTHER): Payer: PPO | Admitting: Adult Health

## 2018-02-23 ENCOUNTER — Encounter: Payer: Self-pay | Admitting: Adult Health

## 2018-02-23 VITALS — BP 140/70 | Temp 97.3°F | Wt 212.0 lb

## 2018-02-23 DIAGNOSIS — L249 Irritant contact dermatitis, unspecified cause: Secondary | ICD-10-CM | POA: Diagnosis not present

## 2018-02-23 MED ORDER — METHYLPREDNISOLONE ACETATE 80 MG/ML IJ SUSP
80.0000 mg | Freq: Once | INTRAMUSCULAR | Status: AC
  Administered 2018-02-23: 80 mg via INTRAMUSCULAR

## 2018-02-23 MED ORDER — TRIAMCINOLONE ACETONIDE 0.5 % EX OINT: 1.0000 "application " | TOPICAL_OINTMENT | Freq: Two times a day (BID) | CUTANEOUS | 0 refills | Status: DC

## 2018-02-23 NOTE — Progress Notes (Signed)
Subjective:    Patient ID: Amanda [name], female    DOB: 1941-06-21, 77 y.o.   MRN: [id number]  HPI  77 year old female who  has a past medical history of Allergic rhinitis (01/23/2016), C. difficile colitis, CAD (coronary artery disease), Cancer ([place]), Candidiasis of skin (09/30/2014), Cirrhosis ([place]), Depression, Diabetes mellitus (2008), Gout, Herpes simplex, Hyperglycemia (05/31/2013), Hyperlipidemia, Hypertension, Myocardial infarction [facility]), Neuromuscular disorder ([place]), Obesity, Polycythemia, and Psoriasis.  She presents to the office today for facial rash that has been present for 2 weeks. Report no pain but itching and irritation. Unknown trigger. She thought maybe it could have been make up or detergent. She has since stopped wearing makeup and has changed to an hypoallergenic washing detergent. She has been apply hydrocortisone cream. She has noticed slight improvement.   Denies any fatigue, fevers, or joint pain. Does not have rash on other parts of body.   Has had ANA drawn in the past which was negative   Review of Systems See HPI   Past Medical History:  Diagnosis Date  . Allergic rhinitis 01/23/2016  . C. difficile colitis   . CAD (coronary artery disease)    a. s/p STEMI in 01/2015 with 95% LCx stenosis and distal 80% LCx stenosis (DESx2 placed)  . Cancer ([place])    SKIN  . Candidiasis of skin 09/30/2014  . Cirrhosis ([place])   . Depression   . Diabetes mellitus 2008  . Gout   . Herpes simplex   . Hyperglycemia 05/31/2013  . Hyperlipidemia   . Hypertension   . Myocardial infarction ([place])   . 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 Obesity   . Polycythemia    Dr. Elease Hashimoto- [facility]  . Psoriasis     Social History   Socioeconomic History  . Marital status: Married    Spouse name: Not on file  . Number of children: 2  . Years of education: 2 yr colle  . Highest education level: Not on file  Occupational History  . Occupation: housewife  Social  Needs  . Financial resource strain: Not on file  . Food insecurity:    Worry: Not on file    Inability: Not on file  . Transportation needs:    Medical: Not on file    Non-medical: Not on file  Tobacco Use  . Smoking status: Former Smoker    Years: 48.00    Types: Cigarettes    Last attempt to quit: 10/18/1986    Years since quitting: 31.3  . Smokeless tobacco: Never Used  Substance and Sexual Activity  . Alcohol use: No    Alcohol/week: 0.0 oz  . Drug use: No  . Sexual activity: Yes    Partners: Male    Birth control/protection: None  Lifestyle  . Physical activity:    Days per week: Not on file    Minutes per session: Not on file  . Stress: Not on file  Relationships  . Social connections:    Talks on phone: Not on file    Gets together: Not on file    Attends religious service: Not on file    Active member of club or organization: Not on file    Attends meetings of clubs or organizations: Not on file    Relationship status: Not on file  . Intimate partner violence:    Fear of current or ex partner: Not on file    Emotionally abused: Not on file  Physically abused: Not on file    Forced sexual activity: Not on file  Other Topics Concern  . Not on file  Social History Narrative   2 caffeine drink per day.  No regular exercise.     Retired from the bank   2 children (Daughter and a son) son has morbid obesity   2 grandchildren   3 great grandchildren    Past Surgical History:  Procedure Laterality Date  . BREAST SURGERY Left    milk duct  . CATARACT EXTRACTION, BILATERAL    . COLONOSCOPY    . LEFT HEART CATHETERIZATION WITH CORONARY ANGIOGRAM N/A 01/27/2015   Procedure: LEFT HEART CATHETERIZATION WITH CORONARY ANGIOGRAM;  Surgeon: [name], MD;  Location[facility] C[facility] LAB;  Service: Cardiovascular;  Laterality: N/A;  . TONSILLECTOMY    . TOTAL ABDOMINAL HYSTERECTOMY W/ BILATERAL SALPINGOOPHORECTOMY     for heavy periods with appendectomy    Family  History  Problem Relation Age of Onset  . Diabetes Mother   . Heart disease Mother        CAD  . Hyperlipidemia Mother   . Hypertension Mother   . Kidney disease Mother   . Kidney disease Father   . Breast cancer Maternal Aunt   . Heart disease Maternal Grandmother   . Heart disease Other        maternal aunts and uncles    Allergies  Allergen Reactions  . Lisinopril Cough  . Tape Hives  . Doxycycline Hives, Swelling and Rash  . Latex Hives, Itching and Rash    Current Outpatient Medications on File Prior to Visit  Medication Sig Dispense Refill  . amLODipine (NORVASC) 5 MG tablet Take 1 tablet (5 mg total) by mouth daily. 30 tablet 1  . aspirin EC 81 MG tablet Take 81 mg by mouth daily.    . Blood Glucose Monitoring Suppl (ACCU-CHEK GUIDE) w/Device KIT 1 kit by Does not apply route as directed. 1 kit 0  . diphenoxylate-atropine (LOMOTIL) 2.5-0.025 MG tablet Take 1-2 tablets by mouth 2 (two) times daily as needed for diarrhea or loose stools. 60 tablet 0  . glucose blood (ACCU-CHEK GUIDE) test strip Use to check blood sugars 3 times daily 100 each 12  . Insulin Pen Needle 32G X 4 MM MISC Use to inject insulins. 100 each 3  . insulin regular human CONCENTRATED (HUMULIN R U-500 KWIKPEN) 500 UNIT/ML kwikpen Inject 100 units under the skin before breakfast and 100 units before dinner. 18 pen 1  . loperamide (IMODIUM A-D) 2 MG tablet Take 1-2 tablets (2-4 mg total) by mouth daily as needed for diarrhea or loose stools. 30 tablet 1  . LORazepam (ATIVAN) 0.5 MG tablet 1 tablet by mouth 30 minutes before MRI. May repeat x 1 if needed 2 tablet 0  . losartan (COZAAR) 100 MG tablet TAKE 1 TABLET BY MOUTH ONCE DAILY 90 tablet 1  . meloxicam (MOBIC) 7.5 MG tablet Take 1 tablet (7.5 mg total) by mouth daily. 30 tablet 2  . nitroGLYCERIN (NITROSTAT) 0.4 MG SL tablet Place 1 tablet (0.4 mg total) under the tongue every 5 (five) minutes as needed for chest pain. 25 tablet 3  . ondansetron (ZOFRAN  ODT) 4 MG disintegrating tablet Take 1 tablet (4 mg total) by mouth every 8 (eight) hours as needed. 10 tablet 0  . oxyCODONE-acetaminophen (PERCOCET/ROXICET) 5-325 MG tablet Take 1 tablet by mouth every 4 (four) hours as needed for severe pain. 30 tablet 0  .  promethazine (PHENERGAN) 25 MG tablet Take 0.5 tablets (12.5 mg total) by mouth every 6 (six) hours as needed for nausea or vomiting. 30 tablet 3  . rosuvastatin (CRESTOR) 20 MG tablet TAKE 1 TABLET(20 MG) BY MOUTH DAILY 90 tablet 3  . ruxolitinib phosphate (JAKAFI) 25 MG tablet Take 1 tablet (25 mg total) by mouth 2 (two) times daily. 60 tablet 11   No current facility-administered medications on file prior to visit.     BP 140/70   Temp (!) 97.3 F (36.3 C) (Oral)   Wt 212 lb (96.2 kg)   BMI 37.55 kg/m       Objective:   Physical Exam  Constitutional: She is oriented to person, place, and time. She appears well-developed and well-nourished. No distress.  HENT:  Head:    Neurological: She is alert and oriented to person, place, and time.  Skin: Skin is warm and dry. Capillary refill takes less than 2 seconds. Rash noted. Rash is macular. Rash is not pustular and not vesicular. She is not diaphoretic.  Psychiatric: She has a normal mood and affect. Her behavior is normal. Judgment and thought content normal.  Nursing note and vitals reviewed.     Assessment & Plan:  1. Irritant contact dermatitis, unspecified trigger - Appears as contact dermatitis. Does not appear as lupus rash or shingles  - triamcinolone ointment (KENALOG) 0.5 %; Apply 1 application topically 2 (two) times daily.  Dispense: 30 g; Refill: 0 - methylPREDNISolone acetate (DEPO-MEDROL) injection 80 mg - Follow up if not resolved in 2-3 days   [name], NP

## 2018-03-14 ENCOUNTER — Inpatient Hospital Stay: Payer: PPO

## 2018-03-14 ENCOUNTER — Inpatient Hospital Stay: Payer: PPO | Attending: Family | Admitting: Family

## 2018-03-14 VITALS — BP 176/70 | HR 80 | Temp 97.4°F | Resp 20 | Wt 213.0 lb

## 2018-03-14 DIAGNOSIS — K219 Gastro-esophageal reflux disease without esophagitis: Secondary | ICD-10-CM | POA: Diagnosis not present

## 2018-03-14 DIAGNOSIS — D45 Polycythemia vera: Secondary | ICD-10-CM

## 2018-03-14 DIAGNOSIS — R112 Nausea with vomiting, unspecified: Secondary | ICD-10-CM | POA: Insufficient documentation

## 2018-03-14 DIAGNOSIS — Z7982 Long term (current) use of aspirin: Secondary | ICD-10-CM | POA: Insufficient documentation

## 2018-03-14 DIAGNOSIS — D508 Other iron deficiency anemias: Secondary | ICD-10-CM

## 2018-03-14 DIAGNOSIS — Z79899 Other long term (current) drug therapy: Secondary | ICD-10-CM | POA: Diagnosis not present

## 2018-03-14 DIAGNOSIS — R0602 Shortness of breath: Secondary | ICD-10-CM | POA: Diagnosis not present

## 2018-03-14 DIAGNOSIS — Z794 Long term (current) use of insulin: Secondary | ICD-10-CM | POA: Diagnosis not present

## 2018-03-14 DIAGNOSIS — E1165 Type 2 diabetes mellitus with hyperglycemia: Secondary | ICD-10-CM | POA: Insufficient documentation

## 2018-03-14 DIAGNOSIS — R197 Diarrhea, unspecified: Secondary | ICD-10-CM | POA: Diagnosis not present

## 2018-03-14 DIAGNOSIS — IMO0002 Reserved for concepts with insufficient information to code with codable children: Secondary | ICD-10-CM

## 2018-03-14 DIAGNOSIS — E1140 Type 2 diabetes mellitus with diabetic neuropathy, unspecified: Secondary | ICD-10-CM

## 2018-03-14 DIAGNOSIS — D500 Iron deficiency anemia secondary to blood loss (chronic): Secondary | ICD-10-CM

## 2018-03-14 LAB — CMP (CANCER CENTER ONLY)
ALT: 21 U/L (ref 10–47)
ANION GAP: 10 (ref 5–15)
AST: 27 U/L (ref 11–38)
Albumin: 3.6 g/dL (ref 3.5–5.0)
Alkaline Phosphatase: 133 U/L — ABNORMAL HIGH (ref 26–84)
BUN: 11 mg/dL (ref 7–22)
CALCIUM: 9.2 mg/dL (ref 8.0–10.3)
CO2: 26 mmol/L (ref 18–33)
CREATININE: 1 mg/dL (ref 0.60–1.20)
Chloride: 105 mmol/L (ref 98–108)
GLUCOSE: 291 mg/dL — AB (ref 73–118)
Potassium: 3.7 mmol/L (ref 3.3–4.7)
Sodium: 141 mmol/L (ref 128–145)
Total Bilirubin: 1.4 mg/dL (ref 0.2–1.6)
Total Protein: 6.7 g/dL (ref 6.4–8.1)

## 2018-03-14 LAB — CBC WITH DIFFERENTIAL (CANCER CENTER ONLY)
BASOS ABS: 0.4 10*3/uL — AB (ref 0.0–0.1)
Basophils Relative: 1 %
Eosinophils Absolute: 0.8 10*3/uL — ABNORMAL HIGH (ref 0.0–0.5)
Eosinophils Relative: 2 %
HEMATOCRIT: 42.4 % (ref 34.8–46.6)
Hemoglobin: 14.3 g/dL (ref 11.6–15.9)
LYMPHS ABS: 2.3 10*3/uL (ref 0.9–3.3)
LYMPHS PCT: 6 %
MCH: 31.1 pg (ref 26.0–34.0)
MCHC: 33.7 g/dL (ref 32.0–36.0)
MCV: 92.2 fL (ref 81.0–101.0)
MONOS PCT: 3 %
Monocytes Absolute: 1.2 10*3/uL — ABNORMAL HIGH (ref 0.1–0.9)
Neutro Abs: 33.7 10*3/uL — ABNORMAL HIGH (ref 1.5–6.5)
Neutrophils Relative %: 88 %
PLATELETS: 552 10*3/uL — AB (ref 145–400)
RBC: 4.6 MIL/uL (ref 3.70–5.32)
RDW: 17.8 % — ABNORMAL HIGH (ref 11.1–15.7)
WBC: 38.4 10*3/uL — AB (ref 3.9–10.0)

## 2018-03-14 LAB — IRON AND TIBC
Iron: 82 ug/dL (ref 41–142)
SATURATION RATIOS: 33 % (ref 21–57)
TIBC: 250 ug/dL (ref 236–444)
UIBC: 168 ug/dL

## 2018-03-14 LAB — FERRITIN: Ferritin: 169 ng/mL (ref 9–269)

## 2018-03-14 MED ORDER — PANCRELIPASE (LIP-PROT-AMYL) 36000-114000 UNITS PO CPEP: 36000.0000 [IU] | ORAL_CAPSULE | Freq: Three times a day (TID) | ORAL | 3 refills | Status: DC

## 2018-03-14 NOTE — Progress Notes (Signed)
Hematology and Oncology Follow Up Visit  Amanda [name] [id number] November 16, 1940 77 y.o. 03/14/2018   Principle Diagnosis:  [name] - JAK2 (+)  Current Therapy:   Jakafi 52m PO BID   Interim History:  Amanda [name] here today with her husband for follow-up. She is really having a hard time due to uncontrolled diabetes. She states that she just wants to lay in the bad all the time and does not go out much. She does not check her blood sugars regularly or administer her insulin as prescribed.  She cancelled her last appointment with her endocrinologist and has not rescheduled. I encouraged her to reschedule this due to her symptoms.  She has swelling and tenderness in her legs making it hard to stand and ambulate. This is unchanged. No redness noted. Pedal pulses are +2.  She uses a can or walker as needed for support. No falls or syncopal episodes.  She has the same numbness and tingling in her toes. This is unchanged.  She has occasional episodes of nausea with vomiting and does have history of GERD. She has diarrhea after eating and has not been taking her Creon which has helped in the past. I re-ordered this today.  Due to the diarrhea she does not eat well. She states she is hydrating. Her weight is stable.  No episodes of bleeding. She does bruise easily but not in excess.  No lymphadenopathy noted on exam.  Hgb is 14.3 and platelets 552. Her WBC count is up to 38.4. This may be up a little more due to her facial rash.  She verbalized that she is taking her Jakafi PO BID as prescribed.  She has a red rash across her cheeks and nose and a lesion on her left cheek. She is using Kenalog cream and states that it looks "90%" better now than when it started. Her PCP is monitoring this and has referred her to dermatology but she has not made an appointment with them yet. I encouraged her to follow up on this.  She has SOB and some dizziness at times with activity. No fever, chills,  cough, chest pain, palpitations, abdominal pain or changes in bladder habits.   ECOG Performance Status: 1 - Symptomatic but completely ambulatory  Medications:  Allergies as of 03/14/2018      Reactions   Lisinopril Cough   Tape Hives   Doxycycline Hives, Swelling, Rash   Latex Hives, Itching, Rash      Medication List        Accurate as of 03/14/18  9:54 AM. Always use your most recent med list.          ACCU-CHEK GUIDE w/Device Kit 1 kit by Does not apply route as directed.   amLODipine 5 MG tablet Commonly known as:  NORVASC Take 1 tablet (5 mg total) by mouth daily.   aspirin EC 81 MG tablet Take 81 mg by mouth daily.   diphenoxylate-atropine 2.5-0.025 MG tablet Commonly known as:  LOMOTIL Take 1-2 tablets by mouth 2 (two) times daily as needed for diarrhea or loose stools.   glucose blood test strip Commonly known as:  ACCU-CHEK GUIDE Use to check blood sugars 3 times daily   Insulin Pen Needle 32G X 4 MM Misc Use to inject insulins.   insulin regular human CONCENTRATED 500 UNIT/ML kwikpen Commonly known as:  HUMULIN R U-500 KWIKPEN Inject 100 units under the skin before breakfast and 100 units before dinner.   loperamide 2 MG  tablet Commonly known as:  IMODIUM A-D Take 1-2 tablets (2-4 mg total) by mouth daily as needed for diarrhea or loose stools.   LORazepam 0.5 MG tablet Commonly known as:  ATIVAN 1 tablet by mouth 30 minutes before MRI. May repeat x 1 if needed   losartan 100 MG tablet Commonly known as:  COZAAR TAKE 1 TABLET BY MOUTH ONCE DAILY   meloxicam 7.5 MG tablet Commonly known as:  MOBIC Take 1 tablet (7.5 mg total) by mouth daily.   nitroGLYCERIN 0.4 MG SL tablet Commonly known as:  NITROSTAT Place 1 tablet (0.4 mg total) under the tongue every 5 (five) minutes as needed for chest pain.   ondansetron 4 MG disintegrating tablet Commonly known as:  ZOFRAN ODT Take 1 tablet (4 mg total) by mouth every 8 (eight) hours as needed.     oxyCODONE-acetaminophen 5-325 MG tablet Commonly known as:  PERCOCET/ROXICET Take 1 tablet by mouth every 4 (four) hours as needed for severe pain.   promethazine 25 MG tablet Commonly known as:  PHENERGAN Take 0.5 tablets (12.5 mg total) by mouth every 6 (six) hours as needed for nausea or vomiting.   rosuvastatin 20 MG tablet Commonly known as:  CRESTOR TAKE 1 TABLET(20 MG) BY MOUTH DAILY   ruxolitinib phosphate 25 MG tablet Commonly known as:  JAKAFI Take 1 tablet (25 mg total) by mouth 2 (two) times daily.   triamcinolone ointment 0.5 % Commonly known as:  KENALOG Apply 1 application topically 2 (two) times daily.       Allergies:  Allergies  Allergen Reactions  . Lisinopril Cough  . Tape Hives  . Doxycycline Hives, Swelling and Rash  . Latex Hives, Itching and Rash    Past Medical History, Surgical history, Social history, and Family History were reviewed and updated.  Review of Systems: All other 10 point review of systems is negative.   Physical Exam:  weight is 213 lb (96.6 kg). Her oral temperature is 97.4 F (36.3 C) (abnormal). Her blood pressure is 176/70 (abnormal) and her pulse is 80. Her respiration is 20 and oxygen saturation is 94%.   Wt Readings from Last 3 Encounters:  03/14/18 213 lb (96.6 kg)  02/23/18 212 lb (96.2 kg)  01/31/18 214 lb 4 oz (97.2 kg)    Ocular: Sclerae unicteric, pupils equal, round and reactive to light Ear-nose-throat: Oropharynx clear, dentition fair Lymphatic: No cervical, supraclavicular or axillary adenopathy Lungs no rales or rhonchi, good excursion bilaterally Heart regular rate and rhythm, no murmur appreciated Abd soft, nontender, positive bowel sounds, no liver or spleen tip palpated on exam, no fluid wave  MSK no focal spinal tenderness, no joint edema Neuro: non-focal, well-oriented, appropriate affect Breasts: Deferred   Lab Results  Component Value Date   WBC 31.8 Repeated and verified X2. (HH)  01/31/2018   HGB 13.8 01/31/2018   HCT 41.7 01/31/2018   MCV 89.5 01/31/2018   PLT 688.0 (H) 01/31/2018   Lab Results  Component Value Date   FERRITIN 185 12/13/2017   IRON 80 12/13/2017   TIBC 216 (L) 12/13/2017   UIBC 137 12/13/2017   IRONPCTSAT 37 12/13/2017   Lab Results  Component Value Date   RETICCTPCT 2.4 01/30/2014   RBC 4.66 01/31/2018   No results found for: KPAFRELGTCHN, LAMBDASER, KAPLAMBRATIO Lab Results  Component Value Date   IGA 233 03/29/2014   No results found for: TOTALPROTELP, ALBUMINELP, A1GS, A2GS, BETS, BETA2SER, GAMS, [place], SPEI   Chemistry  Component Value Date/Time   NA 138 01/31/2018 1138   NA 140 10/12/2017 1013   NA 138 01/21/2017 0806   K 3.9 01/31/2018 1138   K 3.7 10/12/2017 1013   K 4.1 01/21/2017 0806   CL 104 01/31/2018 1138   CL 103 10/12/2017 1013   CL 103 02/16/2013 0900   CO2 26 01/31/2018 1138   CO2 25 10/12/2017 1013   CO2 21 (L) 01/21/2017 0806   BUN 13 01/31/2018 1138   BUN 10 10/12/2017 1013   BUN 12.2 01/21/2017 0806   CREATININE 0.91 01/31/2018 1138   CREATININE 1.20 12/13/2017 0906   CREATININE 0.9 10/12/2017 1013   CREATININE 1.0 01/21/2017 0806      Component Value Date/Time   CALCIUM 9.0 01/31/2018 1138   CALCIUM 8.7 10/12/2017 1013   CALCIUM 9.3 01/21/2017 0806   ALKPHOS 118 (H) 01/31/2018 1138   ALKPHOS 97 (H) 10/12/2017 1013   ALKPHOS 112 01/21/2017 0806   AST 27 01/31/2018 1138   AST 30 12/13/2017 0906   AST 37 (H) 01/21/2017 0806   ALT 10 01/31/2018 1138   ALT 12 12/13/2017 0906   ALT 25 10/12/2017 1013   ALT 16 01/21/2017 0806   BILITOT 1.1 01/31/2018 1138   BILITOT 1.2 12/13/2017 0906   BILITOT 0.89 01/21/2017 0806      Impression and Plan: Ms. Kehres is a very pleasant 77 yo caucasian female with polycythemia vera, JAK-2 positive. She is doing well on Jakafi PO BID. Her biggest issue at this time appears to be her uncontrolled diabetes and noncompliance with her insulin.  She promises  to follow-up with her endocrinologist and rescheduled her missed appointment.  She will continue her same regimen with Jakafi. Hgb is 14.3, WBC count 38.4 and platelet count 552.  We will see what her iron studies show and bring her back in for infusion if needed.  We will plan to see her back in another 3 months for follow-up.  They will contact our office with any questions or concerns. We can certainly see her sooner if need be.   Laverna Peace, NP 5/28/20199:54 AM

## 2018-03-16 ENCOUNTER — Ambulatory Visit: Payer: Medicare Other | Admitting: *Deleted

## 2018-03-17 ENCOUNTER — Encounter: Payer: Self-pay | Admitting: Internal Medicine

## 2018-03-17 ENCOUNTER — Ambulatory Visit (INDEPENDENT_AMBULATORY_CARE_PROVIDER_SITE_OTHER): Payer: PPO | Admitting: Internal Medicine

## 2018-03-17 VITALS — BP 150/80 | HR 88 | Ht 63.0 in | Wt 215.8 lb

## 2018-03-17 DIAGNOSIS — E669 Obesity, unspecified: Secondary | ICD-10-CM | POA: Diagnosis not present

## 2018-03-17 DIAGNOSIS — Z794 Long term (current) use of insulin: Secondary | ICD-10-CM | POA: Diagnosis not present

## 2018-03-17 DIAGNOSIS — E1142 Type 2 diabetes mellitus with diabetic polyneuropathy: Secondary | ICD-10-CM

## 2018-03-17 MED ORDER — INSULIN REGULAR HUMAN (CONC) 500 UNIT/ML ~~LOC~~ SOPN: PEN_INJECTOR | SUBCUTANEOUS | 1 refills | Status: DC

## 2018-03-17 NOTE — Patient Instructions (Signed)
Patient Instructions  Please change: - U500 insulin  85 units before b'fast 25 units before dinner 85 units before dinner  Please start Trulicity 0.06 mg weekly. Let me know when you are close to running out to call in the higher dose to your pharmacy (1.5 mg).  Please stop at the lab.  Please return in 4 months with your sugar log.

## 2018-03-17 NOTE — Progress Notes (Signed)
Patient ID: Amanda [name], female   DOB: [birth year], 77 y.o.   MRN: [id number]  HPI: Amanda [name] is a 77 y.o.-year-old female, returning for f/u for DM2, dx 2008, insulin-dependent since dx, uncontrolled, with complications (DR, PN, CAD). Last visit 7 mo ago.  She is here with her husband who offers part of the history, especially related to her CBG readings, her medication doses, and her diet.  She has polycythemia vera and sees Dr. [name].  Last hemoglobin A1c was: 08/30/2017: HbA1c calculated from fructosamine: 7.7% 05/06/2017: HbA1c calculated from the fructosamine appears better, at 6.7%. 02/11/2016: HbA1c from  fructosamine is 7.5% Lab Results  Component Value Date   HGBA1C 8.4 (H) 01/10/2018   HGBA1C 9.6 10/28/2015   HGBA1C 8.8 (H) 01/27/2015  07/01/2015: HbA1c from fructosamine 6.57%. 01/27/2015: HbA1c from fructosamine: 6.57% 05/16/2014: HbA1c from fructosamine: 7.95%. - in 10/16/2012, hemoglobin A1c was 6.9% - in 07/2012, hemoglobin A1c was 9.9% - in 11/16/2010, hemoglobin A1c was 12.8% - in 03/2011, hemoglobin A1c was 6.9% - In 07/10/2010 her hemoglobin A1c was 10.5% C-peptide was 6.3 and 01/2011.  On U500 pens: - 100 units before b'fast (may be 7 am to 11 am)  >> 85 units in am (did not increase the dose to 100). Skips b'fast some mornings. - 85 units before dinner >> 100 units before dinner - - added 10/2016 >> not using Stopped Invokana 100 mg in the past b/c of negative publicity.  She could not tolerate doses of metformin XR higher than 250 mg twice a day in the past due to diarrhea. She does not want to resume this. Was previously on the Victoza 1.8 mg daily - "made me sick". She was previously on Janumet between 2012-2013.  She checks sugars 4x a day: - am: 200-250, 365 >> 233, 291 >> 170-240 - 2h after b'fast: 238 >> n/c >> 167 >> n/c - before lunch:  69, 90-198, 250 >> n/c >> (may skip lunch): 170-240 - 2h after lunch: 130-150s, 280 >> n/c >> 379, 406 >>  n/c - before dinner: 120-140s >> 170-210 >> 241 >> 140-210 - bedtime: 180-220 >> n/c >> 200-305 >> n/c Lowest sugar was 23 in the hospital (?!) >> 200 >> 65 (at night) x 1; she has hypoglycemia awareness at around 100. Highest 452 >> 270.  She was seen for nutritional advice and diabetes education, with last visit on 04/2012 - [name]..  - no h/o CKD, last BUN/creatinine:  Lab Results  Component Value Date   BUN 11 03/14/2018   CREATININE 1.00 03/14/2018  On Cozaar.  + HL; latest Lipid panel: Lab Results  Component Value Date   CHOL 207 (H) 02/14/2017   HDL 34.00 (L) 02/14/2017   LDLCALC 88 01/27/2015   LDLDIRECT 103.0 02/14/2017   TRIG 243.0 (H) 02/14/2017   CHOLHDL 6 02/14/2017  On pravastatin. - last eye exam was in 04/2017: No DR. 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  + Cataracts - + Peripheral neuropathy >> saw PCP, podiatrist, orthopedist >> tried capsaicin cream, compression stockings, now on Neurontin and neuropathy cream On ASA 81.  She also has a history of hypertension, diastolic dysfunction, polycytemia vera, gout, psoriasis, depression, history of Bell's palsy, obesity, vitamin D deficiency.  On 01/27/2015 she had an AMI >> had 2 stents placed.  ROS: Constitutional: no weight gain/no weight loss, no fatigue, no subjective hyperthermia, no subjective hypothermia Eyes: no blurry vision, no xerophthalmia ENT: no  sore throat, no nodules palpated in throat, no dysphagia, no odynophagia, no hoarseness Cardiovascular: no CP/no SOB/no palpitations/no leg swelling Respiratory: no cough/no SOB/no wheezing Gastrointestinal: no N/no V/no D/no C/no acid reflux Musculoskeletal: no muscle aches/no joint aches Skin: no rashes, no hair loss Neurological: no tremors/no numbness/no tingling/no dizziness  I reviewed pt's medications, allergies, PMH, social hx, family hx, and changes were documented in the history of present illness.  Otherwise, unchanged from my initial visit note.  Past Medical History:  Diagnosis Date  . Allergic rhinitis 01/23/2016  . C. difficile colitis   . CAD (coronary artery disease)    a. s/p STEMI in 01/2015 with 95% LCx stenosis and distal 80% LCx stenosis (DESx2 placed)  . Cancer ([place])    SKIN  . Candidiasis of skin 09/30/2014  . Cirrhosis ([place])   . Depression   . Diabetes mellitus 2008  . Gout   . Herpes simplex   . Hyperglycemia 05/31/2013  . Hyperlipidemia   . Hypertension   . Myocardial infarction ([place])   . Neuromuscular disorder ([place])    BELL PALSY  . Obesity   . Polycythemia    Dr. Elease Hashimoto- [facility]  . Psoriasis    Past Surgical History:  Procedure Laterality Date  . BREAST SURGERY Left    milk duct  . CATARACT EXTRACTION, BILATERAL    . COLONOSCOPY    . LEFT HEART CATHETERIZATION WITH CORONARY ANGIOGRAM N/A 01/27/2015   Procedure: LEFT HEART CATHETERIZATION WITH CORONARY ANGIOGRAM;  Surgeon: [name], MD;  Location: [facility] CATH LAB;  Service: Cardiovascular;  Laterality: N/A;  . TONSILLECTOMY    . TOTAL ABDOMINAL HYSTERECTOMY W/ BILATERAL SALPINGOOPHORECTOMY     for heavy periods with appendectomy   Social History   Socioeconomic History  . Marital status: Married    Spouse name: Not on file  . Number of children: 2  . Years of education: 2 yr colle  . Highest education level: Not on file  Occupational History  . Occupation: housewife  Social Needs  . Financial resource strain: Not on file  . Food insecurity:    Worry: Not on file    Inability: Not on file  . Transportation needs:    Medical: Not on file    Non-medical: Not on file  Tobacco Use  . Smoking status: Former Smoker    Years: 48.00    Types: Cigarettes    Last attempt to quit: 10/18/1986    Years since quitting: 31.4  . Smokeless tobacco: Never Used  Substance and Sexual Activity  . Alcohol use: No    Alcohol/week: 0.0 oz  . Drug use: No  . Sexual activity: Yes    Partners: Male     Birth control/protection: None  Lifestyle  . Physical activity:    Days per week: Not on file    Minutes per session: Not on file  . Stress: Not on file  Relationships  . Social connections:    Talks on phone: Not on file    Gets together: Not on file    Attends religious service: Not on file    Active member of club or organization: Not on file    Attends meetings of clubs or organizations: Not on file    Relationship status: Not on file  . Intimate partner violence:    Fear of current or ex partner: Not on file    Emotionally abused: Not on file    Physically abused: Not on file  Forced sexual activity: Not on file  Other Topics Concern  . Not on file  Social History Narrative   2 caffeine drink per day.  No regular exercise.     Retired from the bank   2 children (Daughter and a son) son has morbid obesity   2 grandchildren   3 great grandchildren   Current Outpatient Medications on File Prior to Visit  Medication Sig Dispense Refill  . amLODipine (NORVASC) 5 MG tablet Take 1 tablet (5 mg total) by mouth daily. 30 tablet 1  . aspirin EC 81 MG tablet Take 81 mg by mouth daily.    . Blood Glucose Monitoring Suppl (ACCU-CHEK GUIDE) w/Device KIT 1 kit by Does not apply route as directed. 1 kit 0  . diphenoxylate-atropine (LOMOTIL) 2.5-0.025 MG tablet Take 1-2 tablets by mouth 2 (two) times daily as needed for diarrhea or loose stools. 60 tablet 0  . glucose blood (ACCU-CHEK GUIDE) test strip Use to check blood sugars 3 times daily 100 each 12  . Insulin Pen Needle 32G X 4 MM MISC Use to inject insulins. 100 each 3  . insulin regular human CONCENTRATED (HUMULIN R U-500 KWIKPEN) 500 UNIT/ML kwikpen Inject 100 units under the skin before breakfast and 100 units before dinner. 18 pen 1  . lipase/protease/amylase (CREON) 36000 UNITS CPEP capsule Take 1 capsule (36,000 Units total) by mouth 3 (three) times daily with meals. 180 capsule 3  . loperamide (IMODIUM A-D) 2 MG tablet  Take 1-2 tablets (2-4 mg total) by mouth daily as needed for diarrhea or loose stools. 30 tablet 1  . LORazepam (ATIVAN) 0.5 MG tablet 1 tablet by mouth 30 minutes before MRI. May repeat x 1 if needed 2 tablet 0  . losartan (COZAAR) 100 MG tablet TAKE 1 TABLET BY MOUTH ONCE DAILY 90 tablet 1  . meloxicam (MOBIC) 7.5 MG tablet Take 1 tablet (7.5 mg total) by mouth daily. 30 tablet 2  . nitroGLYCERIN (NITROSTAT) 0.4 MG SL tablet Place 1 tablet (0.4 mg total) under the tongue every 5 (five) minutes as needed for chest pain. 25 tablet 3  . ondansetron (ZOFRAN ODT) 4 MG disintegrating tablet Take 1 tablet (4 mg total) by mouth every 8 (eight) hours as needed. 10 tablet 0  . oxyCODONE-acetaminophen (PERCOCET/ROXICET) 5-325 MG tablet Take 1 tablet by mouth every 4 (four) hours as needed for severe pain. 30 tablet 0  . promethazine (PHENERGAN) 25 MG tablet Take 0.5 tablets (12.5 mg total) by mouth every 6 (six) hours as needed for nausea or vomiting. 30 tablet 3  . rosuvastatin (CRESTOR) 20 MG tablet TAKE 1 TABLET(20 MG) BY MOUTH DAILY 90 tablet 3  . ruxolitinib phosphate (JAKAFI) 25 MG tablet Take 1 tablet (25 mg total) by mouth 2 (two) times daily. 60 tablet 11  . triamcinolone ointment (KENALOG) 0.5 % Apply 1 application topically 2 (two) times daily. 30 g 0   No current facility-administered medications on file prior to visit.    Allergies  Allergen Reactions  . Lisinopril Cough  . Tape Hives  . Doxycycline Hives, Swelling and Rash  . Latex Hives, Itching and Rash   Family History  Problem Relation Age of Onset  . Diabetes Mother   . Heart disease Mother        CAD  . Hyperlipidemia Mother   . Hypertension Mother   . Kidney disease Mother   . Kidney disease Father   . Breast cancer Maternal Aunt   . Heart disease  Maternal Grandmother   . Heart disease Other        maternal aunts and uncles    PE: BP (!) 150/80   Pulse 88   Ht 5' 3"  (1.6 m)   Wt 215 lb 12.8 oz (97.9 kg)   SpO2 94%    BMI 38.23 kg/m  Body mass index is 38.23 kg/m. Wt Readings from Last 3 Encounters:  03/17/18 215 lb 12.8 oz (97.9 kg)  03/14/18 213 lb (96.6 kg)  02/23/18 212 lb (96.2 kg)   Constitutional: overweight, in NAD Eyes: PERRLA, EOMI, no exophthalmos ENT: moist mucous membranes, no thyromegaly, no cervical lymphadenopathy Cardiovascular: RRR, No MRG Respiratory: CTA B Gastrointestinal: abdomen soft, NT, ND, BS+ Musculoskeletal: no deformities, strength intact in all 4 Skin: moist, warm, no rashes Neurological: no tremor with outstretched hands, DTR normal in all 4  ASSESSMENT: 1. 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 Diabetic peripheral neuropathy  3.  Obesity  PLAN:  1. Patient with long-standing, uncontrolled, type 2 diabetes, very insulin resistant, on U500 insulin regimen + Invokana.  At last visit, she was missing many insulin doses and was also out of Invokana for 2 weeks and her sugars were quite high in the 200s - 400s.  I strongly advised her to get back on track with this but we also increased the doses of her insulin as she felt that her sugars were still high even when she took them as advised.  She did not change the dose, apparently, per husband's report.  They forgot.  I also advised her that if the sugars did not improve to split the insulin regimen in 3 doses, before each meal.  They would like to go ahead and do this now since taking insulin 3 times a day as a condition for her to obtain a freestyle libre CGM.  I also think that she will do better with the tid, rather than a bid regimen. - Her husband inquires about Trulicity and we can try this, although she has tried Victoza in the past and had nausea from it.  He will try to obtain it from [place] directly.  He will let me know if he can get this through the patient assistant program.  We will start with a low dose and advance to the 1.5 mg weekly as  tolerated.  She has no history of pancreatitis or family history of medullary thyroid cancer.  We discussed that her doses of insulin may need to adjust it down further when she starts Trulicity. - I advised her to: Patient Instructions  Please change: - U500 insulin  85 units before b'fast 25 units before dinner 85 units before dinner  Please start Trulicity 0.96 mg weekly. Let me know when you are close to running out to call in the higher dose to your pharmacy (1.5 mg).  Please stop at the lab.  Please return in 4 months with your sugar log.   - We will check a fructosamine level today - HbA1c is not accurate for her due to the PE vera and should not be checked. - continue checking sugars at different times of the day - check 3x a day, rotating checks - advised for yearly eye exams >> she is UTD - Return to clinic in 4 mo with sugar log   2. Diabetic peripheral neuropathy -stable -Continue Neurontin 600 mg 2X a day -continues neuropathy cream:  Amitriptyline 2%, Lidocaine 2%, and Ketamine  1% -takes this as needed  3. Obesity -She lost 7 pounds since last visit -Unfortunately, we have to keep her on U500 insulin since she is very insulin resistant.  However, will continue Invokana which should also help.  We will also try to add Trulicity.  Office Visit on 03/17/2018 Component Date Value Ref Range Status . Fructosamine 03/17/2018 279* 190 - 270 umol/L Final : Dear Ms Caryl Comes, HbA1c calculated from fructosamine is the best you had in a long while, at 6.35%. Sincerely, [name] MD   [name], MD PhD [facility] Endocrinology

## 2018-03-20 LAB — FRUCTOSAMINE: Fructosamine: 279 umol/L — ABNORMAL HIGH (ref 190–270)

## 2018-03-22 ENCOUNTER — Inpatient Hospital Stay (HOSPITAL_COMMUNITY)
Admission: EM | Admit: 2018-03-22 | Discharge: 2018-03-26 | DRG: 065 | Disposition: A | Discharge status: Skilled Nursing Facility | Payer: PPO | Attending: Internal Medicine | Admitting: Internal Medicine

## 2018-03-22 ENCOUNTER — Encounter (HOSPITAL_COMMUNITY): Payer: Self-pay

## 2018-03-22 ENCOUNTER — Emergency Department (HOSPITAL_COMMUNITY): Payer: PPO

## 2018-03-22 DIAGNOSIS — Z8349 Family history of other endocrine, nutritional and metabolic diseases: Secondary | ICD-10-CM | POA: Diagnosis not present

## 2018-03-22 DIAGNOSIS — Z9842 Cataract extraction status, left eye: Secondary | ICD-10-CM | POA: Diagnosis not present

## 2018-03-22 DIAGNOSIS — I959 Hypotension, unspecified: Secondary | ICD-10-CM | POA: Diagnosis not present

## 2018-03-22 DIAGNOSIS — Z794 Long term (current) use of insulin: Secondary | ICD-10-CM

## 2018-03-22 DIAGNOSIS — Z888 Allergy status to other drugs, medicaments and biological substances status: Secondary | ICD-10-CM

## 2018-03-22 DIAGNOSIS — I130 Hypertensive heart and chronic kidney disease with heart failure and stage 1 through stage 4 chronic kidney disease, or unspecified chronic kidney disease: Secondary | ICD-10-CM | POA: Diagnosis not present

## 2018-03-22 DIAGNOSIS — Z90710 Acquired absence of both cervix and uterus: Secondary | ICD-10-CM | POA: Diagnosis not present

## 2018-03-22 DIAGNOSIS — R41841 Cognitive communication deficit: Secondary | ICD-10-CM | POA: Diagnosis not present

## 2018-03-22 DIAGNOSIS — D471 Chronic myeloproliferative disease: Secondary | ICD-10-CM | POA: Diagnosis not present

## 2018-03-22 DIAGNOSIS — Z6839 Body mass index (BMI) 39.0-39.9, adult: Secondary | ICD-10-CM

## 2018-03-22 DIAGNOSIS — Z66 Do not resuscitate: Secondary | ICD-10-CM | POA: Diagnosis present

## 2018-03-22 DIAGNOSIS — E11319 Type 2 diabetes mellitus with unspecified diabetic retinopathy without macular edema: Secondary | ICD-10-CM | POA: Diagnosis present

## 2018-03-22 DIAGNOSIS — Z8673 Personal history of transient ischemic attack (TIA), and cerebral infarction without residual deficits: Secondary | ICD-10-CM | POA: Diagnosis present

## 2018-03-22 DIAGNOSIS — R0600 Dyspnea, unspecified: Secondary | ICD-10-CM | POA: Diagnosis not present

## 2018-03-22 DIAGNOSIS — D473 Essential (hemorrhagic) thrombocythemia: Secondary | ICD-10-CM | POA: Diagnosis not present

## 2018-03-22 DIAGNOSIS — Z791 Long term (current) use of non-steroidal anti-inflammatories (NSAID): Secondary | ICD-10-CM

## 2018-03-22 DIAGNOSIS — I6522 Occlusion and stenosis of left carotid artery: Secondary | ICD-10-CM | POA: Diagnosis not present

## 2018-03-22 DIAGNOSIS — E1142 Type 2 diabetes mellitus with diabetic polyneuropathy: Secondary | ICD-10-CM | POA: Diagnosis not present

## 2018-03-22 DIAGNOSIS — E1122 Type 2 diabetes mellitus with diabetic chronic kidney disease: Secondary | ICD-10-CM | POA: Diagnosis present

## 2018-03-22 DIAGNOSIS — I2510 Atherosclerotic heart disease of native coronary artery without angina pectoris: Secondary | ICD-10-CM

## 2018-03-22 DIAGNOSIS — K529 Noninfective gastroenteritis and colitis, unspecified: Secondary | ICD-10-CM | POA: Diagnosis present

## 2018-03-22 DIAGNOSIS — I5032 Chronic diastolic (congestive) heart failure: Secondary | ICD-10-CM | POA: Diagnosis present

## 2018-03-22 DIAGNOSIS — Z9841 Cataract extraction status, right eye: Secondary | ICD-10-CM

## 2018-03-22 DIAGNOSIS — Z91048 Other nonmedicinal substance allergy status: Secondary | ICD-10-CM

## 2018-03-22 DIAGNOSIS — Z803 Family history of malignant neoplasm of breast: Secondary | ICD-10-CM

## 2018-03-22 DIAGNOSIS — M109 Gout, unspecified: Secondary | ICD-10-CM | POA: Diagnosis present

## 2018-03-22 DIAGNOSIS — I639 Cerebral infarction, unspecified: Principal | ICD-10-CM | POA: Diagnosis present

## 2018-03-22 DIAGNOSIS — K7460 Unspecified cirrhosis of liver: Secondary | ICD-10-CM | POA: Diagnosis not present

## 2018-03-22 DIAGNOSIS — M48061 Spinal stenosis, lumbar region without neurogenic claudication: Secondary | ICD-10-CM | POA: Diagnosis not present

## 2018-03-22 DIAGNOSIS — I10 Essential (primary) hypertension: Secondary | ICD-10-CM

## 2018-03-22 DIAGNOSIS — N179 Acute kidney failure, unspecified: Secondary | ICD-10-CM | POA: Diagnosis not present

## 2018-03-22 DIAGNOSIS — G4733 Obstructive sleep apnea (adult) (pediatric): Secondary | ICD-10-CM | POA: Diagnosis not present

## 2018-03-22 DIAGNOSIS — E669 Obesity, unspecified: Secondary | ICD-10-CM | POA: Diagnosis present

## 2018-03-22 DIAGNOSIS — Z7401 Bed confinement status: Secondary | ICD-10-CM

## 2018-03-22 DIAGNOSIS — M6281 Muscle weakness (generalized): Secondary | ICD-10-CM | POA: Diagnosis not present

## 2018-03-22 DIAGNOSIS — R0989 Other specified symptoms and signs involving the circulatory and respiratory systems: Secondary | ICD-10-CM | POA: Diagnosis not present

## 2018-03-22 DIAGNOSIS — E860 Dehydration: Secondary | ICD-10-CM | POA: Diagnosis not present

## 2018-03-22 DIAGNOSIS — D45 Polycythemia vera: Secondary | ICD-10-CM

## 2018-03-22 DIAGNOSIS — M4726 Other spondylosis with radiculopathy, lumbar region: Secondary | ICD-10-CM | POA: Diagnosis not present

## 2018-03-22 DIAGNOSIS — F40240 Claustrophobia: Secondary | ICD-10-CM | POA: Diagnosis not present

## 2018-03-22 DIAGNOSIS — R29700 NIHSS score 0: Secondary | ICD-10-CM | POA: Diagnosis present

## 2018-03-22 DIAGNOSIS — M8580 Other specified disorders of bone density and structure, unspecified site: Secondary | ICD-10-CM | POA: Diagnosis not present

## 2018-03-22 DIAGNOSIS — R000 Tachycardia, unspecified: Secondary | ICD-10-CM | POA: Diagnosis not present

## 2018-03-22 DIAGNOSIS — D75839 Thrombocytosis, unspecified: Secondary | ICD-10-CM | POA: Diagnosis present

## 2018-03-22 DIAGNOSIS — N189 Chronic kidney disease, unspecified: Secondary | ICD-10-CM | POA: Diagnosis not present

## 2018-03-22 DIAGNOSIS — I712 Thoracic aortic aneurysm, without rupture: Secondary | ICD-10-CM | POA: Diagnosis not present

## 2018-03-22 DIAGNOSIS — R42 Dizziness and giddiness: Secondary | ICD-10-CM | POA: Diagnosis not present

## 2018-03-22 DIAGNOSIS — L409 Psoriasis, unspecified: Secondary | ICD-10-CM | POA: Diagnosis present

## 2018-03-22 DIAGNOSIS — Z8249 Family history of ischemic heart disease and other diseases of the circulatory system: Secondary | ICD-10-CM

## 2018-03-22 DIAGNOSIS — Z841 Family history of disorders of kidney and ureter: Secondary | ICD-10-CM

## 2018-03-22 DIAGNOSIS — I252 Old myocardial infarction: Secondary | ICD-10-CM

## 2018-03-22 DIAGNOSIS — R197 Diarrhea, unspecified: Secondary | ICD-10-CM | POA: Diagnosis not present

## 2018-03-22 DIAGNOSIS — Z833 Family history of diabetes mellitus: Secondary | ICD-10-CM

## 2018-03-22 DIAGNOSIS — I340 Nonrheumatic mitral (valve) insufficiency: Secondary | ICD-10-CM | POA: Diagnosis not present

## 2018-03-22 DIAGNOSIS — Z9111 Patient's noncompliance with dietary regimen: Secondary | ICD-10-CM

## 2018-03-22 DIAGNOSIS — R531 Weakness: Secondary | ICD-10-CM | POA: Diagnosis not present

## 2018-03-22 DIAGNOSIS — R110 Nausea: Secondary | ICD-10-CM | POA: Diagnosis not present

## 2018-03-22 DIAGNOSIS — I519 Heart disease, unspecified: Secondary | ICD-10-CM | POA: Diagnosis not present

## 2018-03-22 DIAGNOSIS — R188 Other ascites: Secondary | ICD-10-CM

## 2018-03-22 DIAGNOSIS — I5030 Unspecified diastolic (congestive) heart failure: Secondary | ICD-10-CM | POA: Diagnosis not present

## 2018-03-22 DIAGNOSIS — E785 Hyperlipidemia, unspecified: Secondary | ICD-10-CM | POA: Diagnosis not present

## 2018-03-22 DIAGNOSIS — R2689 Other abnormalities of gait and mobility: Secondary | ICD-10-CM | POA: Diagnosis not present

## 2018-03-22 DIAGNOSIS — Z91040 Latex allergy status: Secondary | ICD-10-CM

## 2018-03-22 DIAGNOSIS — Z881 Allergy status to other antibiotic agents status: Secondary | ICD-10-CM

## 2018-03-22 DIAGNOSIS — Z87891 Personal history of nicotine dependence: Secondary | ICD-10-CM

## 2018-03-22 DIAGNOSIS — J309 Allergic rhinitis, unspecified: Secondary | ICD-10-CM | POA: Diagnosis present

## 2018-03-22 LAB — URINALYSIS, ROUTINE W REFLEX MICROSCOPIC
Bilirubin Urine: NEGATIVE
Glucose, UA: NEGATIVE mg/dL
Hgb urine dipstick: NEGATIVE
Ketones, ur: NEGATIVE mg/dL
Leukocytes, UA: NEGATIVE
Nitrite: NEGATIVE
Protein, ur: 100 mg/dL — AB
Specific Gravity, Urine: 1.01 (ref 1.005–1.030)
pH: 5 (ref 5.0–8.0)

## 2018-03-22 LAB — COMPREHENSIVE METABOLIC PANEL
ALT: 10 U/L — ABNORMAL LOW (ref 14–54)
AST: 36 U/L (ref 15–41)
Albumin: 3.7 g/dL (ref 3.5–5.0)
Alkaline Phosphatase: 125 U/L (ref 38–126)
Anion gap: 10 (ref 5–15)
BUN: 40 mg/dL — ABNORMAL HIGH (ref 6–20)
CO2: 18 mmol/L — ABNORMAL LOW (ref 22–32)
Calcium: 8.7 mg/dL — ABNORMAL LOW (ref 8.9–10.3)
Chloride: 112 mmol/L — ABNORMAL HIGH (ref 101–111)
Creatinine, Ser: 2.5 mg/dL — ABNORMAL HIGH (ref 0.44–1.00)
GFR calc Af Amer: 20 mL/min — ABNORMAL LOW (ref >60)
GFR calc non Af Amer: 17 mL/min — ABNORMAL LOW (ref >60)
Glucose, Bld: 71 mg/dL (ref 65–99)
Potassium: 4.1 mmol/L (ref 3.5–5.1)
Sodium: 140 mmol/L (ref 135–145)
Total Bilirubin: 1.1 mg/dL (ref 0.3–1.2)
Total Protein: 6.3 g/dL — ABNORMAL LOW (ref 6.5–8.1)

## 2018-03-22 LAB — I-STAT CHEM 8, ED
BUN: 64 mg/dL — ABNORMAL HIGH (ref 6–20)
Calcium, Ion: 1.11 mmol/L — ABNORMAL LOW (ref 1.15–1.40)
Chloride: 106 mmol/L (ref 101–111)
Creatinine, Ser: 2.7 mg/dL — ABNORMAL HIGH (ref 0.44–1.00)
Glucose, Bld: 39 mg/dL — CL (ref 65–99)
HCT: 47 % — ABNORMAL HIGH (ref 36.0–46.0)
Hemoglobin: 16 g/dL — ABNORMAL HIGH (ref 12.0–15.0)
Potassium: 6.5 mmol/L (ref 3.5–5.1)
Sodium: 139 mmol/L (ref 135–145)
TCO2: 23 mmol/L (ref 22–32)

## 2018-03-22 LAB — CBC WITH DIFFERENTIAL/PLATELET
Basophils Absolute: 0.5 10*3/uL — ABNORMAL HIGH (ref 0.0–0.1)
Basophils Relative: 1 %
Eosinophils Absolute: 0 10*3/uL (ref 0.0–0.7)
Eosinophils Relative: 0 %
HCT: 46.3 % — ABNORMAL HIGH (ref 36.0–46.0)
Hemoglobin: 14.7 g/dL (ref 12.0–15.0)
Lymphocytes Relative: 6 %
Lymphs Abs: 2.8 10*3/uL (ref 0.7–4.0)
MCH: 29.5 pg (ref 26.0–34.0)
MCHC: 31.7 g/dL (ref 30.0–36.0)
MCV: 93 fL (ref 78.0–100.0)
Monocytes Absolute: 2.8 10*3/uL — ABNORMAL HIGH (ref 0.1–1.0)
Monocytes Relative: 6 %
Neutro Abs: 40.1 10*3/uL — ABNORMAL HIGH (ref 1.7–7.7)
Neutrophils Relative %: 87 %
Platelets: 719 10*3/uL — ABNORMAL HIGH (ref 150–400)
RBC: 4.98 MIL/uL (ref 3.87–5.11)
RDW: 17.6 % — ABNORMAL HIGH (ref 11.5–15.5)
WBC: 46.2 10*3/uL — ABNORMAL HIGH (ref 4.0–10.5)

## 2018-03-22 LAB — I-STAT TROPONIN, ED: Troponin i, poc: 0 ng/mL (ref 0.00–0.08)

## 2018-03-22 LAB — CBG MONITORING, ED: Glucose-Capillary: 68 mg/dL (ref 65–99)

## 2018-03-22 MED ORDER — LORAZEPAM 2 MG/ML IJ SOLN
1.0000 mg | Freq: Once | INTRAMUSCULAR | Status: AC
  Administered 2018-03-22: 1 mg via INTRAVENOUS
  Filled 2018-03-22: qty 1

## 2018-03-22 NOTE — ED Provider Notes (Signed)
[facility]ERGENCY DEPARTMENT Provider Note   CSN: [id number] Arrival date & time: 03/22/18  1728     History   Chief Complaint Chief Complaint  Patient presents with  . Weakness  . Dizziness    HPI Amanda [name] is a 77 y.o. female with history of CAD, colitis, polycythemia vera with frequent diarrhea chronically who presents with a 2-week history of intermittent dizziness and lightheadedness and lower extremity weakness that began when the patient was playing cards this afternoon.  She reports she went to stand up and she felt like she could not lift her legs.  She denies any numbness or tingling that is new.  She has had numbness and tingling in her toes for the past few months and has been worked up for this.  She reports decreased appetite over the past 4 days.  She denies any fevers, chest pain, shortness of breath, abdominal pain, nausea, vomiting, urinary symptoms.  She reports she gets diarrhea regularly.  She denies any vision changes, or unilateral weakness. Patient reports her oncologist gave her a medication for dizziness, however she is unsure which medication. Per chart review, I cannot find a medication for dizziness.  HPI  Past Medical History:  Diagnosis Date  . Allergic rhinitis 01/23/2016  . C. difficile colitis   . CAD (coronary artery disease)    a. s/p STEMI in 01/2015 with 95% LCx stenosis and distal 80% LCx stenosis (DESx2 placed)  . Cancer ([place])    SKIN  . Candidiasis of skin 09/30/2014  . Cirrhosis ([place])   . Depression   . Diabetes mellitus 2008  . Gout   . Herpes simplex   . Hyperglycemia 05/31/2013  . Hyperlipidemia   . Hypertension   . Myocardial infarction ([place])   . Neuromuscular disorder ([place])    BELL PALSY  . Obesity   . Polycythemia    Dr. Elease Hashimoto- [facility]  . Psoriasis     Patient Active Problem List   Diagnosis Date Noted  . Hip injury, left, subsequent encounter 11/08/2017  . Iron deficiency anemia  12/22/2016  . OSA (obstructive sleep apnea) 04/21/2016  . Osteopenia 04/07/2016  . Lung nodule, solitary 02/05/2016  . Aortic dilatation ([place]) 02/05/2016  . Dyspnea 01/28/2016  . Allergic rhinitis 01/23/2016  . Hepatic cirrhosis ([place]) 10/28/2015  . Irritable bowel syndrome 08/12/2015  . Obesity (BMI 30-39.9) 04/30/2015  . Diabetic retinopathy of both eyes ([place]) 04/18/2015  . Former smoker 04/18/2015  . GAD (generalized anxiety disorder) 04/18/2015  . Status post primary angioplasty with coronary stent 04/18/2015  . Coronary artery disease involving native coronary artery 03/01/2015  . Acute diastolic heart failure ([place]) 02/01/2015  . ST elevation myocardial infarction (STEMI) involving left circumflex coronary artery in recovery phase ([place]) 01/27/2015  . Idioventricular rhythm ([place])   . Diabetic peripheral neuropathy associated with type 2 diabetes mellitus ([place]) 01/23/2015  . Type 2 diabetes mellitus with diabetic polyneuropathy ([place]) 01/23/2015  . Thrombocytosis ([place]) 07/04/2014  . Cholelithiasis 02/07/2014  . Chronic diarrhea 01/29/2014  . [name] ([place]) 05/31/2013  . Essential hypertension 05/31/2013  . History of Bell's palsy 05/26/2013  . DERMATITIS, ATOPIC 11/16/2010  . DIZZINESS 11/16/2010  . DIASTOLIC DYSFUNCTION 11/94/1740  . Hyperlipidemia 12/15/2009  . Essential hypertension, benign 11/24/2009  . PSORIASIS 11/24/2009  . Depression 09/13/2009  . 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 BREAST SURGERY Left    milk  duct  . CATARACT EXTRACTION, BILATERAL    . COLONOSCOPY    . LEFT HEART CATHETERIZATION WITH CORONARY ANGIOGRAM N/A 01/27/2015   Procedure: LEFT HEART CATHETERIZATION WITH CORONARY ANGIOGRAM;  Surgeon: [name], MD;  Location: [facility] C[facility] LAB;  Service: Cardiovascular;  Laterality: N/A;  . TONSILLECTOMY    . TOTAL ABDOMINAL HYSTERECTOMY W/ BILATERAL  SALPINGOOPHORECTOMY     for heavy periods with appendectomy     OB History   None      Home Medications    Prior to Admission medications   Medication Sig Start Date End Date Taking? Authorizing Provider  diphenoxylate-atropine (LOMOTIL) 2.5-0.025 MG tablet Take 1-2 tablets by mouth 2 (two) times daily as needed for diarrhea or loose stools. 01/05/18  Yes Nafziger, Tommi Rumps, NP  insulin regular human CONCENTRATED (HUMULIN R U-500 KWIKPEN) 500 UNIT/ML kwikpen Inject 85 units under the skin before breakfast, 25 units before lunch, and 85 units before dinner. Patient taking differently: Inject 100 Units into the skin 2 (two) times daily before a meal.  03/17/18  Yes [name], MD  lipase/protease/amylase (CREON) 36000 UNITS CPEP capsule Take 1 capsule (36,000 Units total) by mouth 3 (three) times daily with meals. 03/14/18  Yes [place], Holli Humbles, NP  loperamide (IMODIUM A-D) 2 MG tablet Take 1-2 tablets (2-4 mg total) by mouth daily as needed for diarrhea or loose stools. 05/28/16  Yes Hali Marry, MD  losartan (COZAAR) 100 MG tablet TAKE 1 TABLET BY MOUTH ONCE DAILY 08/30/17  Yes Debbrah Alar, NP  meloxicam (MOBIC) 7.5 MG tablet Take 1 tablet (7.5 mg total) by mouth daily. 11/07/17  Yes H[place], Sharyn Lull, MD  nitroGLYCERIN (NITROSTAT) 0.4 MG SL tablet Place 1 tablet (0.4 mg total) under the tongue every 5 (five) minutes as needed for chest pain. 12/21/16  Yes [place], Holli Humbles, NP  ondansetron (ZOFRAN ODT) 4 MG disintegrating tablet Take 1 tablet (4 mg total) by mouth every 8 (eight) hours as needed. Patient taking differently: Take 4 mg by mouth every 8 (eight) hours as needed for nausea or vomiting.  01/04/18  Yes Sherwood Gambler, MD  oxyCODONE-acetaminophen (PERCOCET/ROXICET) 5-325 MG tablet Take 1 tablet by mouth every 4 (four) hours as needed for severe pain. 11/07/17  Yes [place], Sharyn Lull, MD  promethazine (PHENERGAN) 25 MG tablet Take 0.5 tablets (12.5 mg total) by mouth  every 6 (six) hours as needed for nausea or vomiting. 12/15/17  Yes [place], Rudell Cobb, MD  rosuvastatin (CRESTOR) 20 MG tablet TAKE 1 TABLET(20 MG) BY MOUTH DAILY 10/17/17  Yes [name], MD  ruxolitinib phosphate (JAKAFI) 25 MG tablet Take 1 tablet (25 mg total) by mouth 2 (two) times daily. 12/20/17  Yes Volanda Napoleon, MD  triamcinolone ointment (KENALOG) 0.5 % Apply 1 application topically 2 (two) times daily. 02/23/18  Yes Nafziger, Tommi Rumps, NP  amLODipine (NORVASC) 5 MG tablet Take 1 tablet (5 mg total) by mouth daily. Patient not taking: Reported on 03/22/2018 01/26/16   Hali Marry, MD  Blood Glucose Monitoring Suppl (ACCU-CHEK GUIDE) w/Device KIT 1 kit by Does not apply route as directed. 01/25/18   [name], MD  glucose blood (ACCU-CHEK GUIDE) test strip Use to check blood sugars 3 times daily 01/25/18   [name], MD  Insulin Pen Needle 32G X 4 MM MISC Use to inject insulins. 01/10/17   [name], MD  LORazepam (ATIVAN) 0.5 MG tablet 1 tablet by mouth 30 minutes before MRI. May repeat x 1 if  needed Patient not taking: Reported on 03/22/2018 04/25/17   Debbrah Alar, NP    Family History Family History  Problem Relation Age of Onset  . Diabetes Mother   . Heart disease Mother        CAD  . Hyperlipidemia Mother   . Hypertension Mother   . Kidney disease Mother   . Kidney disease Father   . Breast cancer Maternal Aunt   . Heart disease Maternal Grandmother   . Heart disease Other        maternal aunts and uncles    Social History Social History   Tobacco Use  . Smoking status: Former Smoker    Years: 48.00    Types: Cigarettes    Last attempt to quit: 10/18/1986    Years since quitting: 31.4  . Smokeless tobacco: Never Used  Substance Use Topics  . Alcohol use: No    Alcohol/week: 0.0 oz  . Drug use: No     Allergies   Lisinopril; Tape; Doxycycline; and Latex   Review of Systems Review of Systems  Constitutional: Negative for  chills and fever.  HENT: Negative for facial swelling and sore throat.   Respiratory: Negative for shortness of breath.   Cardiovascular: Negative for chest pain.  Gastrointestinal: Negative for abdominal pain, nausea and vomiting.  Genitourinary: Negative for dysuria.  Musculoskeletal: Negative for back pain.  Skin: Negative for rash and wound.  Neurological: Positive for dizziness and weakness (generalized lower extremities). Negative for numbness and headaches.  Psychiatric/Behavioral: The patient is not nervous/anxious.      Physical Exam Updated Vital Signs BP (!) 150/71   Pulse 94   Temp 98 F (36.7 C) (Oral)   Resp 19   Ht 5' 3" (1.6 m)   Wt 97.5 kg (215 lb)   SpO2 98%   BMI 38.09 kg/m   Physical Exam  Constitutional: She appears well-developed and well-nourished. No distress.  HENT:  Head: Normocephalic and atraumatic.  Mouth/Throat: Oropharynx is clear and moist. No oropharyngeal exudate.  Eyes: Pupils are equal, round, and reactive to light. Conjunctivae are normal. Right eye exhibits no discharge. Left eye exhibits no discharge. No scleral icterus.  Patient with mild nystagmus and reported dizziness with EOMS  Neck: Normal range of motion. Neck supple. No thyromegaly present.  Cardiovascular: Normal rate, regular rhythm, normal heart sounds and intact distal pulses. Exam reveals no gallop and no friction rub.  No murmur heard. Pulmonary/Chest: Effort normal and breath sounds normal. No stridor. No respiratory distress. She has no wheezes. She has no rales.  Abdominal: Soft. Bowel sounds are normal. She exhibits no distension. There is no tenderness. There is no rebound and no guarding.  Musculoskeletal: She exhibits no edema.  Lymphadenopathy:    She has no cervical adenopathy.  Neurological: She is alert. Coordination normal.  CN 3-12 intact; normal sensation throughout; 5/5 strength in all 4 extremities, except 4/5 on the RUE with extension; equal bilateral grip  strength  Skin: Skin is warm and dry. No rash noted. She is not diaphoretic. No pallor.  Psychiatric: She has a normal mood and affect.  Nursing note and vitals reviewed.    ED Treatments / Results  Labs (all labs ordered are listed, but only abnormal results are displayed) Labs Reviewed  CBC WITH DIFFERENTIAL/PLATELET - Abnormal; Notable for the following components:      Result Value   WBC 46.2 (*)    HCT 46.3 (*)    RDW 17.6 (*)  Platelets 719 (*)    Neutro Abs 40.1 (*)    Monocytes Absolute 2.8 (*)    Basophils Absolute 0.5 (*)    All other components within normal limits  I-STAT CHEM 8, ED - Abnormal; Notable for the following components:   Potassium 6.5 (*)    BUN 64 (*)    Creatinine, Ser 2.70 (*)    Glucose, Bld 39 (*)    Calcium, Ion 1.11 (*)    Hemoglobin 16.0 (*)    HCT 47.0 (*)    All other components within normal limits  URINALYSIS, ROUTINE W REFLEX MICROSCOPIC  PATHOLOGIST SMEAR REVIEW  COMPREHENSIVE METABOLIC PANEL  I-STAT TROPONIN, ED  CBG MONITORING, ED    EKG None  Radiology Mr Brain Wo Contrast  Result Date: 03/22/2018 CLINICAL DATA:  Initial evaluation for generalized weakness, vertigo. EXAM: MRI HEAD WITHOUT CONTRAST TECHNIQUE: Multiplanar, multiecho pulse sequences of the brain and surrounding structures were obtained without intravenous contrast. COMPARISON:  Prior MRI from 01/29/2014. FINDINGS: Brain: Moderately advanced cerebral atrophy. Patchy T2/FLAIR hyperintensity within the periventricular and deep white matter both cerebral hemispheres, most consistent with chronic small vessel ischemic change. Superimposed scatter remote lacunar infarcts present within the bilateral cerebral hemispheric white matter. Small remote right cerebellar infarct noted as well. Punctate focus of diffusion abnormality within the deep white matter of the right centrum semi ovale consistent with acute ischemic small vessel type infarct (series 4, image 34). No  associated hemorrhage. No other evidence for acute or subacute ischemia. Gray-white matter differentiation otherwise maintained. No evidence for acute or chronic intracranial hemorrhage. No mass lesion, midline shift, or mass effect. Ventriculomegaly with asymmetric dilatation of the left lateral ventricle stable from previous. No hydrocephalus. No extra-axial fluid collection. Normal pituitary gland. Vascular: Major intracranial vascular flow voids maintained. Skull and upper cervical spine: Upper cervical spine normal. Craniocervical junction normal. Heterogeneous marrow signal intensity again noted. No discrete osseous lesion. No scalp soft tissue abnormality. Sinuses/Orbits: Globes and orbital soft tissues within normal limits. Patient status post lens extraction bilaterally. Paranasal sinuses are clear. Right mastoid effusion with partial opacification of the right middle ear cavity, similar to previous, likely chronic. Visualized nasopharynx clear. Other: None. IMPRESSION: 1. Punctate small vessel type acute ischemic nonhemorrhagic infarct involving the posterior right centrum semi ovale. 2. Moderately advanced cerebral atrophy with mild to moderate chronic small vessel ischemic disease. 3. Chronic right mastoid effusion. Electronically Signed   By: Jeannine Boga M.D.   On: 03/22/2018 21:33    Procedures Procedures (including critical care time)  Medications Ordered in ED Medications  LORazepam (ATIVAN) injection 1 mg (1 mg Intravenous Given 03/22/18 1944)     Initial Impression / Assessment and Plan / ED Course  I have reviewed the triage vital signs and the nursing notes.  Pertinent labs & imaging results that were available during my care of the patient were reviewed by me and considered in my medical decision making (see chart for details).     Patient with Punctate small vessel type acute ischemic nonhemorrhagic infarct involving the posterior right centrum semi ovale found on MRI.   Patient with progressively increasing WBC, this is being followed by oncologist, however it is progressively increasing. Troponin negative.  CMP pending.  I spoke with neurologist, Dr. Lorraine Lax, who recommends medicine admission for stroke work-up and he will evaluate the patient.  Case discussed with Dr. Roel Cluck with [facility] who will admit the patient for further evaluation and treatment.   Final Clinical Impressions(s) /  ED Diagnoses   Final diagnoses:  Cerebrovascular accident (CVA), unspecified mechanism [facility])    ED Discharge Orders    None       Caryl Ada 03/22/18 2233    Davonna Belling, MD 03/24/18 [id number]

## 2018-03-22 NOTE — ED Notes (Signed)
IV attempts x2 IV team consult placed

## 2018-03-22 NOTE — ED Triage Notes (Signed)
Pt arrived via [facility] from home c/o generalized weakness and dizziness for several days.  Pt states she was having a hard time getting around at home and that's why she came per EMS.

## 2018-03-22 NOTE — ED Notes (Signed)
Dr. Marvis Repress ( admitting MD ) notified on pt.'s hypertension .

## 2018-03-22 NOTE — H&P (Signed)
Amanda [name] TWS:[id number] DOB: [birth year] DOA: 03/22/2018     PCP: [name], NP   Outpatient Specialists:  CARDS:  Dr. [name]    Oncology  Dr. [name] Patient arrived to ER on 03/22/18 at 62  Patient coming from:   home Lives   With family    Chief Complaint:  Chief Complaint  Patient presents with  . Weakness  . Dizziness    HPI: Amanda [name] is a 76 y.o. female with medical history significant of DM2, CAD, CKD, HLD, Cirrosis, HTN, Polycyhemia diastolic CHF    Presented with has been having intermittent lightheadedness and dizziness for past few weeks.  Today she stood up from playing cards and could not move her legs she has been having some numbness and tingling in her toes for history of diabetic neuropathy.  Has not been eating drinking well.  No fevers chills no chest pain or shortness of breath no nausea vomiting has chronic diarrhea. She has not been feeling well for the past 4 days.  She has chronic dyspnea on exertion. She is a former smoker  Regarding pertinent Chronic problems: CAD STEMI  status post DES On aspirin. Poorly controlled diabetes mellitus. Hx of polycythemia followed by oncology While in ER:   Following Medications were ordered in ER: Medications  LORazepam (ATIVAN) injection 1 mg (1 mg Intravenous Given 03/22/18 1944)    Significant initial  Findings: Abnormal Labs Reviewed  CBC WITH DIFFERENTIAL/PLATELET - Abnormal; Notable for the following components:      Result Value   WBC 46.2 (*)    HCT 46.3 (*)    RDW 17.6 (*)    Platelets 719 (*)    Neutro Abs 40.1 (*)    Monocytes Absolute 2.8 (*)    Basophils Absolute 0.5 (*)    All other components within normal limits  COMPREHENSIVE METABOLIC PANEL - Abnormal; Notable for the following components:   Chloride 112 (*)    CO2 18 (*)    BUN 40 (*)    Creatinine, Ser 2.50 (*)    Calcium 8.7 (*)    Total Protein 6.3 (*)    ALT 10 (*)    GFR calc non Af Amer 17 (*)    GFR calc Af  Amer 20 (*)    All other components within normal limits  I-STAT CHEM 8, ED - Abnormal; Notable for the following components:   Potassium 6.5 (*)    BUN 64 (*)    Creatinine, Ser 2.70 (*)    Glucose, Bld 39 (*)    Calcium, Ion 1.11 (*)    Hemoglobin 16.0 (*)    HCT 47.0 (*)    All other components within normal limits     Na 140 K 4.1  Cr  Up from baseline see below Lab Results  Component Value Date   CREATININE 2.50 (H) 03/22/2018   CREATININE 2.70 (H) 03/22/2018   CREATININE 1.00 03/14/2018      WBC 46.2 up from 38.4  HG/HCT   stable,       Component Value Date/Time   HGB 16.0 (H) 03/22/2018 2013   HGB 14.3 03/14/2018 0917   HGB 12.5 10/12/2017 1013   HGB 13.1 12/17/2015 0751   HCT 47.0 (H) 03/22/2018 2013   HCT 37.3 10/12/2017 1013   HCT 40.0 12/17/2015 0751       Troponin (Point of Care Test) Recent Labs    03/22/18 1850  TROPIPOC 0.00  Lactic Acid, Venous    Component Value Date/Time   LATICACIDVEN 1.57 01/04/2018 1505      UA  ordered    MRI brain - Punctate small vessel type acute ischemic posterior right centrum semi ovale  CXR - ordered    ECG:  Personally reviewed by me showing: HR : 97 Rhythm NSR,   no evidence of ischemic changes QTC 484     ED Triage Vitals  Enc Vitals Group     BP 03/22/18 1758 (!) 177/67     Pulse Rate 03/22/18 1758 93     Resp 03/22/18 1758 20     Temp 03/22/18 1758 98 F (36.7 C)     Temp Source 03/22/18 1758 Oral     SpO2 03/22/18 1741 98 %     Weight 03/22/18 1744 215 lb (97.5 kg)     Height 03/22/18 1744 _0  (1.6 m)     Head Circumference --      Peak Flow --      Pain Score 03/22/18 1744 0     Pain Loc --      Pain Edu? --      Excl. in [place]? --   TMAX(24)@       Latest  Blood pressure (!) 148/69, pulse 91, temperature 98 F (36.7 C), temperature source Oral, resp. rate 18, height _1  (1.6 m), weight 97.5 kg (215 lb), SpO2 98 %.   ER Provider Called:   Neurology   Dr.AROOR    They Recommend admit to medicine Will see   in ER  Hospitalist was called for admission for CVA   Review of Systems:    Pertinent positives include: fatigue,localizing neurological complaints  Constitutional:  No weight loss, night sweats, Fevers, chills,  weight loss  HEENT:  No headaches, Difficulty swallowing,Tooth/dental problems,Sore throat,  No sneezing, itching, ear ache, nasal congestion, post nasal drip,  Cardio-vascular:  No chest pain, Orthopnea, PND, anasarca, dizziness, palpitations.no Bilateral lower extremity swelling  GI:  No heartburn, indigestion, abdominal pain, nausea, vomiting, diarrhea, change in bowel habits, loss of appetite, melena, blood in stool, hematemesis Resp:  no shortness of breath at rest. No dyspnea on exertion, No excess mucus, no productive cough, No non-productive cough, No coughing up of blood.No change in color of mucus.No wheezing. Skin:  no rash or lesions. No jaundice GU:  no dysuria, change in color of urine, no urgency or frequency. No straining to urinate.  No flank pain.  Musculoskeletal:  No joint pain or no joint swelling. No decreased range of motion. No back pain.  Psych:  No change in mood or affect. No depression or anxiety. No memory loss.  Neuro: no , no tingling, no weakness, no double vision, no gait abnormality, no slurred speech, no confusion  As per HPI otherwise 10 point review of systems negative.   Past Medical History:   Past Medical History:  Diagnosis Date  . Allergic rhinitis 01/23/2016  . C. difficile colitis   . CAD (coronary artery disease)    a. s/p STEMI in 01/2015 with 95% LCx stenosis and distal 80% LCx stenosis (DESx2 placed)  . Cancer ([place])    SKIN  . Candidiasis of skin 09/30/2014  . Cirrhosis ([place])   . Depression   . Diabetes mellitus 2008  . Gout   . Herpes simplex   . Hyperglycemia 05/31/2013  . Hyperlipidemia   . Hypertension   . Myocardial infarction ([place])   . Neuromuscular disorder  ([place])  BELL PALSY  . Obesity   . Polycythemia    Dr. Elease Hashimoto- [facility]  . Psoriasis       Past Surgical History:  Procedure Laterality Date  . BREAST SURGERY Left    milk duct  . CATARACT EXTRACTION, BILATERAL    . COLONOSCOPY    . LEFT HEART CATHETERIZATION WITH CORONARY ANGIOGRAM N/A 01/27/2015   Procedure: LEFT HEART CATHETERIZATION WITH CORONARY ANGIOGRAM;  Surgeon: [name], MD;  Location[facility] CATH LAB;  Service: Cardiovascular;  Laterality: N/A;  . TONSILLECTOMY    . TOTAL ABDOMINAL HYSTERECTOMY W/ BILATERAL SALPINGOOPHORECTOMY     for heavy periods with appendectomy    Social History:  Ambulatory  independently      reports that she quit smoking about 31 years ago. Her smoking use included cigarettes. She quit after 48.00 years of use. She has never used smokeless tobacco. She reports that she does not drink alcohol or use drugs.     Family History:   Family History  Problem Relation Age of Onset  . Diabetes Mother   . Heart disease Mother        CAD  . Hyperlipidemia Mother   . Hypertension Mother   . Kidney disease Mother   . Kidney disease Father   . Breast cancer Maternal Aunt   . Heart disease Maternal Grandmother   . Heart disease Other        maternal aunts and uncles    Allergies: Allergies  Allergen Reactions  . Lisinopril Cough  . Tape Hives  . Doxycycline Hives, Swelling and Rash  . Latex Hives, Itching and Rash     Prior to Admission medications   Medication Sig Start Date End Date Taking? Authorizing Provider  diphenoxylate-atropine (LOMOTIL) 2.5-0.025 MG tablet Take 1-2 tablets by mouth 2 (two) times daily as needed for diarrhea or loose stools. 01/05/18  Yes Nafziger, Tommi Rumps, NP  insulin regular human CONCENTRATED (HUMULIN R U-500 KWIKPEN) 500 UNIT/ML kwikpen Inject 85 units under the skin before breakfast, 25 units before lunch, and 85 units before dinner. Patient taking differently: Inject 100 Units into the skin 2 (two)  times daily before a meal.  03/17/18  Yes [name], MD  lipase/protease/amylase (CREON) 36000 UNITS CPEP capsule Take 1 capsule (36,000 Units total) by mouth 3 (three) times daily with meals. 03/14/18  Yes [place], Holli Humbles, NP  loperamide (IMODIUM A-D) 2 MG tablet Take 1-2 tablets (2-4 mg total) by mouth daily as needed for diarrhea or loose stools. 05/28/16  Yes Hali Marry, MD  losartan (COZAAR) 100 MG tablet TAKE 1 TABLET BY MOUTH ONCE DAILY 08/30/17  Yes Debbrah Alar, NP  meloxicam (MOBIC) 7.5 MG tablet Take 1 tablet (7.5 mg total) by mouth daily. 11/07/17  Yes Hudnall, Sharyn Lull, MD  nitroGLYCERIN (NITROSTAT) 0.4 MG SL tablet Place 1 tablet (0.4 mg total) under the tongue every 5 (five) minutes as needed for chest pain. 12/21/16  Yes [place], Holli Humbles, NP  ondansetron (ZOFRAN ODT) 4 MG disintegrating tablet Take 1 tablet (4 mg total) by mouth every 8 (eight) hours as needed. Patient taking differently: Take 4 mg by mouth every 8 (eight) hours as needed for nausea or vomiting.  01/04/18  Yes Sherwood Gambler, MD  oxyCODONE-acetaminophen (PERCOCET/ROXICET) 5-325 MG tablet Take 1 tablet by mouth every 4 (four) hours as needed for severe pain. 11/07/17  Yes [place], Sharyn Lull, MD  promethazine (PHENERGAN) 25 MG tablet Take 0.5 tablets (12.5 mg total) by mouth every  6 (six) hours as needed for nausea or vomiting. 12/15/17  Yes Ennever, Rudell Cobb, MD  rosuvastatin (CRESTOR) 20 MG tablet TAKE 1 TABLET(20 MG) BY MOUTH DAILY 10/17/17  Yes [name], MD  ruxolitinib phosphate (JAKAFI) 25 MG tablet Take 1 tablet (25 mg total) by mouth 2 (two) times daily. 12/20/17  Yes Volanda Napoleon, MD  triamcinolone ointment (KENALOG) 0.5 % Apply 1 application topically 2 (two) times daily. 02/23/18  Yes Nafziger, Tommi Rumps, NP  amLODipine (NORVASC) 5 MG tablet Take 1 tablet (5 mg total) by mouth daily. Patient not taking: Reported on 03/22/2018 01/26/16   Hali Marry, MD  Blood Glucose Monitoring  Suppl (ACCU-CHEK GUIDE) w/Device KIT 1 kit by Does not apply route as directed. 01/25/18   [name], MD  glucose blood (ACCU-CHEK GUIDE) test strip Use to check blood sugars 3 times daily 01/25/18   [name], MD  Insulin Pen Needle 32G X 4 MM MISC Use to inject insulins. 01/10/17   [name], MD  LORazepam (ATIVAN) 0.5 MG tablet 1 tablet by mouth 30 minutes before MRI. May repeat x 1 if needed Patient not taking: Reported on 03/22/2018 04/25/17   Debbrah Alar, NP   Physical Exam: Blood pressure (!) 148/69, pulse 91, temperature 98 F (36.7 C), temperature source Oral, resp. rate 18, height _0  (1.6 m), weight 97.5 kg (215 lb), SpO2 98 %. 1. General:  in No Acute distress   Chronically ill -appearing 2. Psychological: Alert and   Oriented 3. Head/ENT:    Dry Mucous Membranes                          Head Non traumatic, neck supple                          Poor Dentition 4. SKIN:  decreased Skin turgor,  Skin clean Dry and intact no rash 5. Heart: Regular rate and rhythm no  Murmur, no Rub or gallop 6. Lungs:  no wheezes some crackles   7. Abdomen: Soft,  non-tender, Non distended   obese  bowel sounds present 8. Lower extremities: no clubbing, cyanosis, or edema 9. Neurologically strength 5 out of 5 in all 4 extremities cranial nerves II through XII intact 10. MSK: Normal range of motion   LABS:     Recent Labs  Lab 03/22/18 1845 03/22/18 2013  WBC 46.2*  --   NEUTROABS 40.1*  --   HGB 14.7 16.0*  HCT 46.3* 47.0*  MCV 93.0  --   PLT 719*  --    Basic Metabolic Panel: Recent Labs  Lab 03/22/18 2013 03/22/18 2157  NA 139 140  K 6.5* 4.1  CL 106 112*  CO2  --  18*  GLUCOSE 39* 71  BUN 64* 40*  CREATININE 2.70* 2.50*  CALCIUM  --  8.7*      Recent Labs  Lab 03/22/18 2157  AST 36  ALT 10*  ALKPHOS 125  BILITOT 1.1  PROT 6.3*  ALBUMIN 3.7   No results for input(s): LIPASE, AMYLASE in the last 168 hours. No results for input(s):  AMMONIA in the last 168 hours.    HbA1C: No results for input(s): HGBA1C in the last 72 hours. CBG: Recent Labs  Lab 03/22/18 2145  GLUCAP 68      Urine analysis:     Cultures:    Component Value Date/Time   SDES  12/13/2017 1436    URINE, CLEAN CATCH Performed at [facility] Lab at [facility], [place], [place], [place] [address]    [facility]  12/13/2017 1436    NONE Performed at [facility] at [facility], [place], [place], [place] [address]    CULT  12/13/2017 1436    NO GROWTH Performed at [facility] [address], [place], [place] [address]    REPTSTATUS 12/14/2017 FINAL 12/13/2017 1436     Radiological Exams on Admission: Mr Brain Wo Contrast  Result Date: 03/22/2018 CLINICAL DATA:  Initial evaluation for generalized weakness, vertigo. EXAM: MRI HEAD WITHOUT CONTRAST TECHNIQUE: Multiplanar, multiecho pulse sequences of the brain and surrounding structures were obtained without intravenous contrast. COMPARISON:  Prior MRI from 01/29/2014. FINDINGS: Brain: Moderately advanced cerebral atrophy. Patchy T2/FLAIR hyperintensity within the periventricular and deep white matter both cerebral hemispheres, most consistent with chronic small vessel ischemic change. Superimposed scatter remote lacunar infarcts present within the bilateral cerebral hemispheric white matter. Small remote right cerebellar infarct noted as well. Punctate focus of diffusion abnormality within the deep white matter of the right centrum semi ovale consistent with acute ischemic small vessel type infarct (series 4, image 34). No associated hemorrhage. No other evidence for acute or subacute ischemia. Gray-white matter differentiation otherwise maintained. No evidence for acute or chronic intracranial hemorrhage. No mass lesion, midline shift, or mass effect. Ventriculomegaly with asymmetric dilatation of the left lateral ventricle  stable from previous. No hydrocephalus. No extra-axial fluid collection. Normal pituitary gland. Vascular: Major intracranial vascular flow voids maintained. Skull and upper cervical spine: Upper cervical spine normal. Craniocervical junction normal. Heterogeneous marrow signal intensity again noted. No discrete osseous lesion. No scalp soft tissue abnormality. Sinuses/Orbits: Globes and orbital soft tissues within normal limits. Patient status post lens extraction bilaterally. Paranasal sinuses are clear. Right mastoid effusion with partial opacification of the right middle ear cavity, similar to previous, likely chronic. Visualized nasopharynx clear. Other: None. IMPRESSION: 1. Punctate small vessel type acute ischemic nonhemorrhagic infarct involving the posterior right centrum semi ovale. 2. Moderately advanced cerebral atrophy with mild to moderate chronic small vessel ischemic disease. 3. Chronic right mastoid effusion. Electronically Signed   By: Jeannine Boga M.D.   On: 03/22/2018 21:33    Chart has been reviewed    Assessment/Plan  77 y.o. female with medical history significant of DM2, CAD, CKD, HLD, Cirrosis, HTN, Polycyhemia diastolic CHF     Admitted for CVA, AKI, possible newly diagnosed interstitial lung disease, dehydration worsening leukocytosis  Present on Admission:  . CVA (cerebral vascular accident) [facility])  - will admit based on TIA/CVA protocol, await results of Carotid Doppler and Echo, obtain cardiac enzymes,  ECG,   Lipid panel, TSH. Order PT/OT evaluation. Will make sure patient is on antiplatelet agent.   Neurology consult.      Dyspnea - abnormal prior CXR, will repeat would likely benefit from CT to evaluate for pulmonary fibrosis, chest x-ray showing possible vascular congestion but poor quality given worsening renal function and history of decreased p.o. intake will hold off on diuresis and instead order CT of the chest tomorrow without contrast to evaluate for  evidence of interstitial lung disease patient likely will benefit from pulmonology follow-up and PFTs.  . Dehydration - will rehydrate follow fluid status . AKI (acute kidney injury) ([place]) -rehydrate obtain urine electrolytes . Chronic diarrhea patient will need to continue follow-up with GI . Coronary  artery disease involving native coronary artery - - chronic, continue aspirin  and statin    . 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 Essential hypertension allow some permissive hypertension for today . Hepatic cirrhosis (HCC) chronic no evidence of thrombocytopenia, albumin stable check INR . Hyperlipidemia stable continue home medications   . 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  Spoke with pharmacy will hold off for now.  Evidence of rising white blood cell count patient has been followed by hematology given progressive rise will need to have close follow-up Could be Hemoconcentration as well . Thrombocytosis ([place]) we will need to continue to follow-up of oncology . Type 2 diabetes mellitus with diabetic polyneuropathy ([place])-  - Order Sensitive  SSI    -  check TSH and HgA1C  - Hold by mouth medications    Other plan as per orders.  DVT prophylaxis:  SCD  Code Status:    DNR/DNI  as per patient   I had personally discussed CODE STATUS with patient   Family Communication:   Family at  Bedside  plan of care was discussed with   Husband,    Disposition Plan:    likely will need placement for rehabilitation                                           Would benefit from PT/OT eval prior to DC   ordered                                            Consults called: neurology   Admission status:   inpatient      Level of care     tele           Toy Baker 03/22/2018, 11:53 PM    [facility]  Pager [phone]   after 2 AM please  page floor coverage PA If 7AM-7PM, please contact the day team taking care of the patient  [email or web address]  Password [email or web address]

## 2018-03-22 NOTE — ED Notes (Signed)
Patient transported to MRI 

## 2018-03-22 NOTE — ED Notes (Signed)
Mini lab running iStat chem 8

## 2018-03-23 ENCOUNTER — Other Ambulatory Visit: Payer: Self-pay

## 2018-03-23 ENCOUNTER — Inpatient Hospital Stay (HOSPITAL_COMMUNITY): Payer: PPO

## 2018-03-23 DIAGNOSIS — I639 Cerebral infarction, unspecified: Principal | ICD-10-CM

## 2018-03-23 DIAGNOSIS — I5030 Unspecified diastolic (congestive) heart failure: Secondary | ICD-10-CM

## 2018-03-23 LAB — HEMOGLOBIN A1C
HEMOGLOBIN A1C: 7.9 % — AB (ref 4.8–5.6)
Mean Plasma Glucose: 180.03 mg/dL

## 2018-03-23 LAB — BRAIN NATRIURETIC PEPTIDE: B Natriuretic Peptide: 675.6 pg/mL — ABNORMAL HIGH (ref 0.0–100.0)

## 2018-03-23 LAB — LIPID PANEL
CHOLESTEROL: 167 mg/dL (ref 0–200)
HDL: 25 mg/dL — ABNORMAL LOW (ref >40)
LDL Cholesterol: 93 mg/dL (ref 0–99)
TRIGLYCERIDES: 247 mg/dL — AB (ref <150)
Total CHOL/HDL Ratio: 6.7 RATIO
VLDL: 49 mg/dL — AB (ref 0–40)

## 2018-03-23 LAB — PATHOLOGIST SMEAR REVIEW

## 2018-03-23 LAB — ECHOCARDIOGRAM COMPLETE
Height: 63 in
Weight: 3541.47 oz

## 2018-03-23 LAB — GLUCOSE, CAPILLARY
GLUCOSE-CAPILLARY: 121 mg/dL — AB (ref 65–99)
Glucose-Capillary: 112 mg/dL — ABNORMAL HIGH (ref 65–99)
Glucose-Capillary: 172 mg/dL — ABNORMAL HIGH (ref 65–99)
Glucose-Capillary: 210 mg/dL — ABNORMAL HIGH (ref 65–99)
Glucose-Capillary: 219 mg/dL — ABNORMAL HIGH (ref 65–99)
Glucose-Capillary: 146 mg/dL — ABNORMAL HIGH (ref 65–99)

## 2018-03-23 LAB — PROTIME-INR
INR: 1.36
Prothrombin Time: 16.7 seconds — ABNORMAL HIGH (ref 11.4–15.2)

## 2018-03-23 LAB — TROPONIN I: TROPONIN I: 0.04 ng/mL — AB (ref <0.03)

## 2018-03-23 LAB — APTT: aPTT: 42 seconds — ABNORMAL HIGH (ref 24–36)

## 2018-03-23 LAB — MRSA PCR SCREENING: MRSA BY PCR: POSITIVE — AB

## 2018-03-23 MED ORDER — HYDRALAZINE HCL 20 MG/ML IJ SOLN
10.0000 mg | Freq: Once | INTRAMUSCULAR | Status: AC
  Administered 2018-03-23: 10 mg via INTRAVENOUS
  Filled 2018-03-23: qty 1

## 2018-03-23 MED ORDER — RUXOLITINIB PHOSPHATE 25 MG PO TABS: 25.0000 mg | ORAL_TABLET | Freq: Two times a day (BID) | ORAL | Status: DC

## 2018-03-23 MED ORDER — ASPIRIN 300 MG RE SUPP: 300.0000 mg | Freq: Every day | RECTAL | Status: DC

## 2018-03-23 MED ORDER — IPRATROPIUM BROMIDE 0.02 % IN SOLN
0.5000 mg | Freq: Four times a day (QID) | RESPIRATORY_TRACT | Status: DC
  Administered 2018-03-23 (×2): 0.5 mg via RESPIRATORY_TRACT
  Filled 2018-03-23 (×4): qty 2.5

## 2018-03-23 MED ORDER — SODIUM CHLORIDE 0.9 % IV SOLN
INTRAVENOUS | Status: DC
  Administered 2018-03-23: 07:00:00 via INTRAVENOUS

## 2018-03-23 MED ORDER — STROKE: EARLY STAGES OF RECOVERY BOOK
Freq: Once | Status: DC
  Filled 2018-03-23: qty 1

## 2018-03-23 MED ORDER — ACETAMINOPHEN 650 MG RE SUPP: 650.0000 mg | RECTAL | Status: DC | PRN

## 2018-03-23 MED ORDER — AMLODIPINE BESYLATE 5 MG PO TABS
5.0000 mg | ORAL_TABLET | Freq: Every day | ORAL | Status: DC
  Administered 2018-03-23 – 2018-03-26 (×4): 5 mg via ORAL
  Filled 2018-03-23 (×4): qty 1

## 2018-03-23 MED ORDER — PANCRELIPASE (LIP-PROT-AMYL) 12000-38000 UNITS PO CPEP
36000.0000 [IU] | ORAL_CAPSULE | Freq: Three times a day (TID) | ORAL | Status: DC
  Administered 2018-03-23 – 2018-03-26 (×6): 36000 [IU] via ORAL
  Filled 2018-03-23 (×11): qty 3

## 2018-03-23 MED ORDER — LOPERAMIDE HCL 2 MG PO CAPS
2.0000 mg | ORAL_CAPSULE | ORAL | Status: DC | PRN
  Administered 2018-03-23 – 2018-03-25 (×3): 2 mg via ORAL
  Filled 2018-03-23 (×4): qty 1

## 2018-03-23 MED ORDER — INSULIN ASPART 100 UNIT/ML ~~LOC~~ SOLN
0.0000 [IU] | SUBCUTANEOUS | Status: DC
  Administered 2018-03-23 (×2): 1 [IU] via SUBCUTANEOUS
  Administered 2018-03-23: 2 [IU] via SUBCUTANEOUS
  Administered 2018-03-23 (×2): 3 [IU] via SUBCUTANEOUS
  Administered 2018-03-24: 1 [IU] via SUBCUTANEOUS
  Administered 2018-03-24: 2 [IU] via SUBCUTANEOUS
  Administered 2018-03-24 (×2): 1 [IU] via SUBCUTANEOUS
  Administered 2018-03-24: 2 [IU] via SUBCUTANEOUS
  Administered 2018-03-24: 3 [IU] via SUBCUTANEOUS
  Administered 2018-03-25: 1 [IU] via SUBCUTANEOUS
  Administered 2018-03-25: 2 [IU] via SUBCUTANEOUS
  Administered 2018-03-25 (×4): 1 [IU] via SUBCUTANEOUS

## 2018-03-23 MED ORDER — ROSUVASTATIN CALCIUM 20 MG PO TABS
20.0000 mg | ORAL_TABLET | Freq: Every day | ORAL | Status: DC
  Administered 2018-03-23 – 2018-03-25 (×3): 20 mg via ORAL
  Filled 2018-03-23 (×4): qty 1

## 2018-03-23 MED ORDER — LACTATED RINGERS IV SOLN
INTRAVENOUS | Status: AC
  Administered 2018-03-23: 19:00:00 via INTRAVENOUS

## 2018-03-23 MED ORDER — LEVALBUTEROL HCL 0.63 MG/3ML IN NEBU: 0.6300 mg | INHALATION_SOLUTION | RESPIRATORY_TRACT | Status: DC | PRN

## 2018-03-23 MED ORDER — ACETAMINOPHEN 160 MG/5ML PO SOLN: 650.0000 mg | ORAL | Status: DC | PRN

## 2018-03-23 MED ORDER — RUXOLITINIB PHOSPHATE 25 MG PO TABS
25.0000 mg | ORAL_TABLET | Freq: Two times a day (BID) | ORAL | Status: DC
  Administered 2018-03-24: 25 mg via ORAL
  Filled 2018-03-23 (×5): qty 1

## 2018-03-23 MED ORDER — ACETAMINOPHEN 325 MG PO TABS
650.0000 mg | ORAL_TABLET | ORAL | Status: DC | PRN
  Filled 2018-03-23: qty 2

## 2018-03-23 MED ORDER — ASPIRIN 325 MG PO TABS
325.0000 mg | ORAL_TABLET | Freq: Every day | ORAL | Status: DC
  Administered 2018-03-23 – 2018-03-26 (×3): 325 mg via ORAL
  Filled 2018-03-23 (×4): qty 1

## 2018-03-23 NOTE — Progress Notes (Signed)
[name] gave RN home dose of ruxolitinib phosphate (JAKAFI) tablet 25 mg in order to continue pt's home regimen.  22 tablets were counted and verified by the pt's husband and brought down to pharmacy. Pharmacy will dispense the home medication twice a day.

## 2018-03-23 NOTE — Consult Note (Signed)
Requesting Physician: Dr. Roel Cluck    Chief Complaint: Weakness, difficulty walking  History obtained from: Patient and Chart     HPI:                                                                                                                                       Amanda [name] is an 77 y.o. female past medical history of diabetes mellitus, hypertension, hyperlipidemia, diabetic neuropathy, cirrhosis, polycythemia presents with difficulty walking, gait imbalance and generalized fatigue.  Patient states that she was at her friend's house playing cards in the afternoon.  She tried to stand up but felt as if though her feet was stuck to the ground.  She was not able to walk and called her daughter to pick her up.  She also states that she has had low appetite over the last 4 days,  Lightheadedness and nausea.  She also complains of chronic diarrhea.   Date last known well: 6.5.19 Time last known well: 12 PM noon tPA Given: No, outside window NIHSS: 0 Baseline MRS 2     Past Medical History:  Diagnosis Date  . Allergic rhinitis 01/23/2016  . C. difficile colitis   . CAD (coronary artery disease)    a. s/p STEMI in 01/2015 with 95% LCx stenosis and distal 80% LCx stenosis (DESx2 placed)  . Cancer ([place])    SKIN  . Candidiasis of skin 09/30/2014  . Cirrhosis ([place])   . Depression   . Diabetes mellitus 2008  . Gout   . Herpes simplex   . Hyperglycemia 05/31/2013  . Hyperlipidemia   . Hypertension   . Myocardial infarction ([place])   . Neuromuscular disorder ([place])    BELL PALSY  . Obesity   . Polycythemia    Dr. Elease Hashimoto- [facility]  . Psoriasis     Past Surgical History:  Procedure Laterality Date  . BREAST SURGERY Left    milk duct  . CATARACT EXTRACTION, BILATERAL    . COLONOSCOPY    . LEFT HEART CATHETERIZATION WITH CORONARY ANGIOGRAM N/A 01/27/2015   Procedure: LEFT HEART CATHETERIZATION WITH CORONARY ANGIOGRAM;  Surgeon: [name], MD;  Location: [facility] C[facility] LAB;   Service: Cardiovascular;  Laterality: N/A;  . TONSILLECTOMY    . TOTAL ABDOMINAL HYSTERECTOMY W/ BILATERAL SALPINGOOPHORECTOMY     for heavy periods with appendectomy    Family History  Problem Relation Age of Onset  . Diabetes Mother   . Heart disease Mother        CAD  . Hyperlipidemia Mother   . Hypertension Mother   . Kidney disease Mother   . Kidney disease Father   . Breast cancer Maternal Aunt   . Heart disease Maternal Grandmother   . Heart disease Other        maternal aunts and uncles   Social History:  reports that she quit smoking about 31 years ago.  Her smoking use included cigarettes. She quit after 48.00 years of use. She has never used smokeless tobacco. She reports that she does not drink alcohol or use drugs.  Allergies:  Allergies  Allergen Reactions  . Lisinopril Cough  . Tape Hives  . Doxycycline Hives, Swelling and Rash  . Latex Hives, Itching and Rash    Medications:                                                                                                                        I reviewed home medications   ROS:                                                                                                                                     14 systems reviewed and negative except above   Examination:                                                                                                      General: Appears well-developed and well-nourished.  Psych: Affect appropriate to situation Eyes: No scleral injection HENT: No OP obstrucion Head: Normocephalic.  Cardiovascular: Normal rate and regular rhythm.  Respiratory: Effort normal and breath sounds normal to anterior ascultation GI: Soft.  No distension. There is no tenderness.  Skin: WDI   Neurological Examination Mental Status: Alert, oriented, thought content appropriate.  Speech fluent without evidence of aphasia. Able to follow 3 step commands without difficulty. Cranial  Nerves: II: Visual fields grossly normal,  III,IV, VI: ptosis not present, extra-ocular motions intact bilaterally, pupils equal, round, reactive to light and accommodation V,VII: smile symmetric, facial light touch sensation normal bilaterally VIII: hearing normal bilaterally IX,X: uvula rises symmetrically XI: bilateral shoulder shrug XII: midline tongue extension Motor: Right : Upper extremity   5/5    Left:     Upper extremity   5/5  Lower extremity   5/5     Lower extremity   5/5 Tone and bulk:normal tone throughout;  no atrophy noted Sensory: Reduced sensation to light touch and temperature in stocking distribution over both legs up to mid shin level. Deep Tendon Reflexes: 1+ and symmetric throughout Plantars: Right: downgoing   Left: downgoing Cerebellar: normal finger-to-nose, normal rapid alternating movements and normal heel-to-shin test Gait: normal gait and station     Lab Results: Basic Metabolic Panel: Recent Labs  Lab 03/22/18 2013 03/22/18 2157  NA 139 140  K 6.5* 4.1  CL 106 112*  CO2  --  18*  GLUCOSE 39* 71  BUN 64* 40*  CREATININE 2.70* 2.50*  CALCIUM  --  8.7*    CBC: Recent Labs  Lab 03/22/18 1845 03/22/18 2013  WBC 46.2*  --   NEUTROABS 40.1*  --   HGB 14.7 16.0*  HCT 46.3* 47.0*  MCV 93.0  --   PLT 719*  --     Coagulation Studies: Recent Labs    03/23/18 0221  LABPROT 16.7*  INR 1.36    Imaging: Dg Chest 2 View  Result Date: 03/23/2018 CLINICAL DATA:  Acute onset of dyspnea. EXAM: CHEST - 2 VIEW COMPARISON:  Chest radiograph performed 01/10/2018, and CTA of the chest performed 01/28/2016 FINDINGS: The lungs are well-aerated. Vascular congestion is noted. Mildly increased interstitial markings may reflect mild interstitial edema. No significant interstitial lung disease was seen on prior CTs. There is no evidence of pleural effusion or pneumothorax. The heart is borderline normal in size. No acute osseous abnormalities are seen.  IMPRESSION: Vascular congestion noted. Mildly increased interstitial markings may reflect mild interstitial edema. Electronically Signed   By: Garald Balding M.D.   On: 03/23/2018 00:25   Mr Brain Wo Contrast  Result Date: 03/22/2018 CLINICAL DATA:  Initial evaluation for generalized weakness, vertigo. EXAM: MRI HEAD WITHOUT CONTRAST TECHNIQUE: Multiplanar, multiecho pulse sequences of the brain and surrounding structures were obtained without intravenous contrast. COMPARISON:  Prior MRI from 01/29/2014. FINDINGS: Brain: Moderately advanced cerebral atrophy. Patchy T2/FLAIR hyperintensity within the periventricular and deep white matter both cerebral hemispheres, most consistent with chronic small vessel ischemic change. Superimposed scatter remote lacunar infarcts present within the bilateral cerebral hemispheric white matter. Small remote right cerebellar infarct noted as well. Punctate focus of diffusion abnormality within the deep white matter of the right centrum semi ovale consistent with acute ischemic small vessel type infarct (series 4, image 34). No associated hemorrhage. No other evidence for acute or subacute ischemia. Gray-white matter differentiation otherwise maintained. No evidence for acute or chronic intracranial hemorrhage. No mass lesion, midline shift, or mass effect. Ventriculomegaly with asymmetric dilatation of the left lateral ventricle stable from previous. No hydrocephalus. No extra-axial fluid collection. Normal pituitary gland. Vascular: Major intracranial vascular flow voids maintained. Skull and upper cervical spine: Upper cervical spine normal. Craniocervical junction normal. Heterogeneous marrow signal intensity again noted. No discrete osseous lesion. No scalp soft tissue abnormality. Sinuses/Orbits: Globes and orbital soft tissues within normal limits. Patient status post lens extraction bilaterally. Paranasal sinuses are clear. Right mastoid effusion with partial opacification  of the right middle ear cavity, similar to previous, likely chronic. Visualized nasopharynx clear. Other: None. IMPRESSION: 1. Punctate small vessel type acute ischemic nonhemorrhagic infarct involving the posterior right centrum semi ovale. 2. Moderately advanced cerebral atrophy with mild to moderate chronic small vessel ischemic disease. 3. Chronic right mastoid effusion. Electronically Signed   By: Jeannine Boga M.D.   On: 03/22/2018 21:33     ASSESSMENT AND PLAN  77 y.o. female past medical history of diabetes mellitus,  hypertension, hyperlipidemia, diabetic neuropathy, cirrhosis, polycythemia presents with difficulty walking, gait imbalance and generalized fatigue.  MRI brain was performed to rule out stroke-however showed a small punctate infarct in the right centrum semiovale.  I suspect this is an incidental finding and does not explain patient's symptoms.  Patient seems fatigued, short of breath and generally weak and needs metabolic work-up.  Her BNP is elevated, she has a leukocytosis.  Generalized weakness Incidental punctate infarct in Right centrum semiovale Hypercoagulable state   #MRA Head and neck  #Transthoracic Echo  # Start patient on ASA 375m daily #Start or continue Atorvastatin 80 mg/other high intensity statin # BP goal: normotension  # HBAIC and Lipid profile # Telemetry monitoring # Frequent neuro checks # stroke swallow screen  Please page stroke NP  Or  PA  Or MD from 8am -4 pm  as this patient from this time will be  followed by the stroke.   You can look them up on [email or web address]  Password [facility]    Sushanth Aroor [facility] Pager Number [phone]

## 2018-03-23 NOTE — Evaluation (Signed)
Speech Language Pathology Evaluation Patient Details Name: Amanda [name] MRN: [id number] DOB: March 23, 1941 Today's Date: 03/23/2018 Time:  -     Problem List:  Patient Active Problem List   Diagnosis Date Noted  . Dehydration 03/22/2018  . AKI (acute kidney injury) ([place]) 03/22/2018  . CVA (cerebral vascular accident) ([place]) 03/22/2018  . Hip injury, left, subsequent encounter 11/08/2017  . Iron deficiency anemia 12/22/2016  . OSA (obstructive sleep apnea) 04/21/2016  . Osteopenia 04/07/2016  . Lung nodule, solitary 02/05/2016  . Aortic dilatation ([place]) 02/05/2016  . Dyspnea 01/28/2016  . Allergic rhinitis 01/23/2016  . Hepatic cirrhosis ([place]) 10/28/2015  . Irritable bowel syndrome 08/12/2015  . Obesity (BMI 30-39.9) 04/30/2015  . Diabetic retinopathy of both eyes ([place]) 04/18/2015  . Former smoker 04/18/2015  . GAD (generalized anxiety disorder) 04/18/2015  . Status post primary angioplasty with coronary stent 04/18/2015  . Coronary artery disease involving native coronary artery 03/01/2015  . Acute diastolic heart failure ([place]) 02/01/2015  . ST elevation myocardial infarction (STEMI) involving left circumflex coronary artery in recovery phase ([place]) 01/27/2015  . Idioventricular rhythm ([place])   . Diabetic peripheral neuropathy associated with type 2 diabetes mellitus ([place]) 01/23/2015  . Type 2 diabetes mellitus with diabetic polyneuropathy ([place]) 01/23/2015  . Thrombocytosis ([place]) 07/04/2014  . Cholelithiasis 02/07/2014  . Chronic diarrhea 01/29/2014  . [name] ([place]) 05/31/2013  . Essential hypertension 05/31/2013  . History of Bell's palsy 05/26/2013  . DERMATITIS, ATOPIC 11/16/2010  . DIZZINESS 11/16/2010  . DIASTOLIC DYSFUNCTION 65/12/5463  . Hyperlipidemia 12/15/2009  . Essential hypertension, benign 11/24/2009  . PSORIASIS 11/24/2009  . Depression 09/13/2009  . 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[place]) 07/08/2008   Past Medical History:  Past  Medical History:  Diagnosis Date  . Allergic rhinitis 01/23/2016  . C. difficile colitis   . CAD (coronary artery disease)    a. s/p STEMI in 01/2015 with 95% LCx stenosis and distal 80% LCx stenosis (DESx2 placed)  . Cancer ([place])    SKIN  . Candidiasis of skin 09/30/2014  . Cirrhosis ([place])   . Depression   . Diabetes mellitus 2008  . Gout   . Herpes simplex   . Hyperglycemia 05/31/2013  . Hyperlipidemia   . Hypertension   . Myocardial infarction ([place])   . Neuromuscular disorder ([place])    BELL PALSY  . Obesity   . Polycythemia    Dr. Elease Hashimoto- [facility]  . Psoriasis    Past Surgical History:  Past Surgical History:  Procedure Laterality Date  . BREAST SURGERY Left    milk duct  . CATARACT EXTRACTION, BILATERAL    . COLONOSCOPY    . LEFT HEART CATHETERIZATION WITH CORONARY ANGIOGRAM N/A 01/27/2015   Procedure: LEFT HEART CATHETERIZATION WITH CORONARY ANGIOGRAM;  Surgeon: [name], MD;  Location: [facility] CATH LAB;  Service: Cardiovascular;  Laterality: N/A;  . TONSILLECTOMY    . TOTAL ABDOMINAL HYSTERECTOMY W/ BILATERAL SALPINGOOPHORECTOMY     for heavy periods with appendectomy   HPI:  77 y.o. female past medical history of diabetes mellitus, hypertension, hyperlipidemia, diabetic neuropathy, cirrhosis, polycythemia presents with difficulty walking, gait imbalance and generalized fatigue.  MRI brain was performed to rule out stroke-however showed a small punctate infarct in the right centrum semiovale.    Assessment / Plan / Recommendation Clinical Impression  Pt demonstrates mild to moderate cognitive impairment on Montreal Cognitive Assessment (MoCA); pt scored a 17 out of a possible 30 points (25 is WNL). Primary areas  of deficit are reasoning, working and short term memory and emergent awareness of errors. Pt reports her memory and independence have been steadily declining over the past few months. She reiles on her husband for most tasks. She also reports feelings of  sadness and hopelessness particularly due to the isolation of her move to [place] and illness. Would recommend referral to Neuropsychologist for further cognitive behavioral assessment given possible multifactoral causes of pts cognitive impairment. Discussed with pt and husband. No SLP f/u needed.     SLP Assessment  SLP Recommendation/Assessment: Patient does not need any further Speech Lanaguage Pathology Services SLP Visit Diagnosis: Cognitive communication deficit (R41.841)    Follow Up Recommendations  None    Frequency and Duration           SLP Evaluation Cognition  Overall Cognitive Status: Within Functional Limits for tasks assessed Orientation Level: Oriented to person;Oriented to place;Oriented to situation;Disoriented to time Attention: Focused;Sustained;Selective;Alternating Focused Attention: Appears intact Sustained Attention: Appears intact Selective Attention: Appears intact Alternating Attention: Appears intact Memory: Impaired Memory Impairment: Storage deficit;Retrieval deficit Awareness: Appears intact Problem Solving: Impaired Problem Solving Impairment: Verbal complex;Functional complex Executive Function: Reasoning;Self Monitoring;Self Correcting Reasoning: Impaired Reasoning Impairment: Verbal complex;Functional complex Self Monitoring: Impaired Self Monitoring Impairment: Verbal complex;Functional complex Self Correcting: Impaired Self Correcting Impairment: Verbal complex;Functional complex Safety/Judgment: Appears intact       Comprehension  Auditory Comprehension Overall Auditory Comprehension: Appears within functional limits for tasks assessed Reading Comprehension Reading Status: Within funtional limits    Expression Verbal Expression Overall Verbal Expression: Appears within functional limits for tasks assessed Written Expression Dominant Hand: Right   Oral / Motor  Oral Motor/Sensory Function Overall Oral Motor/Sensory Function: Within  functional limits Motor Speech Overall Motor Speech: Appears within functional limits for tasks assessed   GO                   [name], MA CCC-SLP [phone]  Lynann Beaver 03/23/2018, 10:57 AM

## 2018-03-23 NOTE — Progress Notes (Signed)
Pt complaining of diarrhea, having small frequent BMs stated that it is from the medicine we are giving her. Refusing Creaon capsules. Notified Wyline Copas, MD. Order placed for Imodium, gave one dose to pt. Will continue to monitor pt.

## 2018-03-23 NOTE — Progress Notes (Signed)
CRITICAL VALUE ALERT  Critical Value:  Troponin 0.04  Date & Time Notified:  03/23/18 3:59 AM  Provider Notified: Baltazar Najjar, NP  Orders Received/Actions taken: No new orders at this time. Will continue to monitor.

## 2018-03-23 NOTE — Progress Notes (Signed)
PROGRESS NOTE    Amanda [name]  PHX:[id number] DOB: [birth year] DOA: 03/22/2018 PCP: [name], NP    Brief Narrative:  77 y.o. female with medical history significant of DM2, CAD, CKD, HLD, Cirrosis, HTN, Polycyhemia diastolic CHF.  Presented with has been having intermittent lightheadedness and dizziness for past few weeks.  Today she stood up from playing cards and could not move her legs she has been having some numbness and tingling in her toes for history of diabetic neuropathy.  Has not been eating drinking well.  No fevers chills no chest pain or shortness of breath no nausea vomiting has chronic diarrhea. She has not been feeling well for the past 4 days.  She has chronic dyspnea on exertion. She is a former smoker  On further questioning, patient states that 3 months prior to this hospital admission, patient was admitted to hospital in [place] for reported pneumonia.  Patient was essentially bedbound for approximately 8 days.  Patient ultimately left the hospital with what seemed like leaving [place] at home to [facility].  Since that hospital discharge, patient reports being markedly weak in her legs with difficulty ambulating.  Over the past several months, patient essentially was either chair or bed bound with difficulty performing ADLs.  At some point, patient was referred to [place] facility for rehab, however she left 1-1/2 days into her stay secondary to not enjoying her stay there and thus never completed physical therapy.  Patient has otherwise not undergone any other kind of physical therapy afterwards.   Assessment & Plan:   Active Problems:   Hyperlipidemia   [name] ([place])   Essential hypertension   Thrombocytosis (HCC)   Diabetic peripheral neuropathy associated with type 2 diabetes mellitus ([place])   Coronary artery disease involving native coronary artery   Hepatic cirrhosis (HCC)   OSA (obstructive sleep  apnea)   Chronic diarrhea   Type 2 diabetes mellitus with diabetic polyneuropathy ([place])   Dehydration   AKI (acute kidney injury) ([place])   CVA (cerebral vascular accident) ([place])  . Lower extremity weakness  with small infarct seen on MRI -  -Neurology consulted.  Recommendations noted -Infarcts seen on MRI likely not responsible for presenting lower extremity weakness per neurology. -.  Bilateral carotid Dopplers reviewed, no significant stenosis noted -Echocardiogram performed, however poor study as patient unable to tolerate procedure secondary to discomfort. -At this time, patient otherwise neurologically intact.  -Per further history, patient noted to have lower extremity weakness showing a prolonged hospital stay 3 months prior and had not undergone physical therapy subsequently.  Suspecting possible chronic deconditioning -Had ordered CT lumbar spine, unremarkable.  Reviewed  Dyspnea -  -Initial chest x-ray reviewed.  Findings consistent with possible edema. -Patient underwent follow-up CT chest with findings assistant with underlying COPD -Patient is continued on as needed Xopenex, will continue -Presently on minimal O2 support  . Dehydration  -continue IV fluid hydration as tolerated  . AKI (acute kidney injury) ([place])  -Continue IV fluid hydration as tolerated -The basic metabolic panel in the morning  . Chronic diarrhea  -Stable at present -Patient will need to continue follow-up with GI  . Coronary artery disease involving native coronary artery  - chronic, continue aspirin  and statin    -No chest pains at present  . Diabetic peripheral neuropathy associated with type 2 diabetes mellitus (HCC) -Current regimen as tolerated -Continue sliding scale insulin as needed  . Essential hypertension  -We will  resume Norvasc -Hold Cozaar secondary to concerns of acute renal failure -Repeat basic metabolic panel in the morning  . Hepatic cirrhosis (HCC) chronic  -no  evidence of thrombocytopenia, albumin stable  -We will continue to follow  . Hyperlipidemia  -Appears to be stable  -Will continue home medications   . [name] ([place])  -Patient noted to be on Jakafi 25 mg PO BID will need to be dose adjusted given worsening renal function.   -Evidence of rising white blood cell count patient has been followed by hematology given progressive rise will need to have close follow-up -Repeat CBC in the morning  . Thrombocytosis ([place])  -Continue with follow-up of oncology  DVT prophylaxis: scd Code Status: DNR Family Communication: Pt in room, family at bedside Disposition Plan: Uncertain at this time  Consultants:   Neurology  Procedures:     Antimicrobials: Anti-infectives (From admission, onward)   None       Subjective: Patient reports continued bilateral LE weakness and pain  Objective: Vitals:   03/23/18 0637 03/23/18 0805 03/23/18 1100 03/23/18 1512  BP: (!) 148/61 (!) 155/65 (!) 144/70 128/69  Pulse:  88 85 88  Resp:  20 19 19   Temp:  98.5 F (36.9 C) 98.7 F (37.1 C) 98 F (36.7 C)  TempSrc:  Oral Oral Oral  SpO2: 95% 94% 99% 98%  Weight:      Height:        Intake/Output Summary (Last 24 hours) at 03/23/2018 1641 Last data filed at 03/23/2018 1513 Gross per 24 hour  Intake 1216.25 ml  Output 1 ml  Net 1215.25 ml   Filed Weights   03/22/18 1744 03/23/18 0045  Weight: 97.5 kg (215 lb) 100.4 kg (221 lb 5.5 oz)    Examination: General exam: Awake, laying in bed, in nad Respiratory system: Normal respiratory effort, no wheezing Cardiovascular system: regular rate, s1, s2 Gastrointestinal system: Soft, nondistended, positive BS Central nervous system: CN2-12 grossly intact, strength intact Extremities: Perfused, no clubbing Skin: Normal skin turgor, no notable skin lesions seen Psychiatry: Mood normal // no visual hallucinations   Data Reviewed: I have personally reviewed following labs and imaging  studies  CBC: Recent Labs  Lab 03/22/18 1845 03/22/18 2013  WBC 46.2*  --   NEUTROABS 40.1*  --   HGB 14.7 16.0*  HCT 46.3* 47.0*  MCV 93.0  --   PLT 719*  --    Basic Metabolic Panel: Recent Labs  Lab 03/22/18 2013 03/22/18 2157  NA 139 140  K 6.5* 4.1  CL 106 112*  CO2  --  18*  GLUCOSE 39* 71  BUN 64* 40*  CREATININE 2.70* 2.50*  CALCIUM  --  8.7*   GFR: Estimated Creatinine Clearance: 21 mL/min (A) (by C-G formula based on SCr of 2.5 mg/dL (H)). Liver Function Tests: Recent Labs  Lab 03/22/18 2157  AST 36  ALT 10*  ALKPHOS 125  BILITOT 1.1  PROT 6.3*  ALBUMIN 3.7   No results for input(s): LIPASE, AMYLASE in the last 168 hours. No results for input(s): AMMONIA in the last 168 hours. Coagulation Profile: Recent Labs  Lab 03/23/18 0221  INR 1.36   Cardiac Enzymes: Recent Labs  Lab 03/23/18 0221  TROPONINI 0.04*   BNP (last 3 results) No results for input(s): PROBNP in the last 8760 hours. HbA1C: Recent Labs    03/23/18 0221  HGBA1C 7.9*   CBG: Recent Labs  Lab 03/22/18 2145 03/23/18 0106 03/23/18 0448 03/23/18 0803 03/23/18  [place]*   Lipid Profile: Recent Labs    03/23/18 0221  CHOL 167  HDL 25*  LDLCALC 93  TRIG 247*  CHOLHDL 6.7   Thyroid Function Tests: No results for input(s): TSH, T4TOTAL, FREET4, T3FREE, THYROIDAB in the last 72 hours. Anemia Panel: No results for input(s): VITAMINB12, FOLATE, FERRITIN, TIBC, IRON, RETICCTPCT in the last 72 hours. Sepsis Labs: No results for input(s): PROCALCITON, LATICACIDVEN in the last 168 hours.  No results found for this or any previous visit (from the past 240 hour(s)).   Radiology Studies: Dg Chest 2 View  Result Date: 03/23/2018 CLINICAL DATA:  Acute onset of dyspnea. EXAM: CHEST - 2 VIEW COMPARISON:  Chest radiograph performed 01/10/2018, and CTA of the chest performed 01/28/2016 FINDINGS: The lungs are well-aerated. Vascular congestion is noted.  Mildly increased interstitial markings may reflect mild interstitial edema. No significant interstitial lung disease was seen on prior CTs. There is no evidence of pleural effusion or pneumothorax. The heart is borderline normal in size. No acute osseous abnormalities are seen. IMPRESSION: Vascular congestion noted. Mildly increased interstitial markings may reflect mild interstitial edema. Electronically Signed   By: Garald Balding M.D.   On: 03/23/2018 00:25   Ct Chest Wo Contrast  Result Date: 03/23/2018 CLINICAL DATA:  Chronic dyspnea.  Smoker. EXAM: CT CHEST WITHOUT CONTRAST TECHNIQUE: Multidetector CT imaging of the chest was performed following the standard protocol without IV contrast. COMPARISON:  Chest radiographs obtained earlier today. Chest CTA dated 01/28/2016. Abdomen and pelvis CT dated 01/04/2018. FINDINGS: Cardiovascular: Atheromatous calcifications, including the coronary arteries and aorta. The ascending thoracic aorta continues to measure 4.1 cm in maximum diameter. Mediastinum/Nodes: No enlarged mediastinal or axillary lymph nodes. Thyroid gland, trachea, and esophagus demonstrate no significant findings. Lungs/Pleura: Minimal bilateral bullous changes. The previously demonstrated 7 x 6 mm superior segment right lower lobe nodule measures 6 x 5 mm on image number 74 series 4. No new lung nodules. No pleural fluid. Upper Abdomen: Changes of cirrhosis of the liver with irregular contours enlarged lateral segment left lobe and caudate lobe with a relatively small right lobe. The spleen is enlarged. Small to moderate amount of free peritoneal fluid. Musculoskeletal: Thoracic and lower cervical spine degenerative changes. IMPRESSION: 1. 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 Recommend annual imaging followup by CTA or MRA. This recommendation follows 2010 ACCF/AHA/AATS/ACR/ASA/SCA/SCAI/SIR/STS/SVM Guidelines for the Diagnosis and  Management of Patients with Thoracic Aortic Disease. Circulation. 2010; 121: I297-L892 2. Stable to slightly decreased size of the previously demonstrated right lower lobe pulmonary nodule. This is compatible with a benign nodule. 3.  Calcific coronary artery and aortic atherosclerosis. 4. Mild changes of COPD with centrilobular and paraseptal emphysema. Aortic Atherosclerosis (ICD10-I70.0) and Emphysema (ICD10-J43.9). Electronically Signed   By: Claudie Revering M.D.   On: 03/23/2018 13:51   Ct Lumbar Spine Wo Contrast  Result Date: 03/23/2018 CLINICAL DATA:  Dyspnea, radiculopathy for greater than 6 weeks. History of cirrhosis. EXAM: CT LUMBAR SPINE WITHOUT CONTRAST TECHNIQUE: Multidetector CT imaging of the lumbar spine was performed without intravenous contrast administration. Multiplanar CT image reconstructions were also generated. COMPARISON:  CT under pelvis January 04, 2018 FINDINGS: SEGMENTATION: For the purposes of this report the last well-formed intervertebral disc space is reported as L5-S1. ALIGNMENT: Maintained lumbar lordosis. No malalignment. VERTEBRAE: Vertebral bodies and posterior elements are intact (L1 incompletely imaged). Intervertebral disc heights preserved, mild endplate spurring. No destructive bony lesions.  L4 stable hemangioma. PARASPINAL AND OTHER SOFT TISSUES: Splenomegaly partially imaged. Partially imaged ascites. Moderate calcific atherosclerosis aortoiliac vessels. DISC LEVELS: L1-2: No disc bulge, canal stenosis nor neural foraminal narrowing. L2-3: Mild endplate spurring, mild to moderate right greater than left facet arthropathy. No canal stenosis. Mild to moderate right neural foraminal narrowing. L3-4: Small broad-based disc osteophyte complex. Mild-to-moderate facet arthropathy and ligamentum flavum redundancy. No canal stenosis. Mild to moderate bilateral neural foraminal narrowing. L4-5: Moderate broad-based disc osteophyte complex. Moderate facet arthropathy and ligamentum  flavum redundancy. Mild canal stenosis. Mild right, moderate left neural foraminal narrowing. L5-S1: Moderate broad-based disc osteophyte complex. Severe facet arthropathy. No canal stenosis. Moderate right, severe left neural foraminal narrowing. IMPRESSION: 1. No fracture or malalignment. 2. Degenerative change of the lumbar spine. Mild canal stenosis L4-5. 3. Normal foraminal narrowing L2-3 through L5-S1: Severe on the left at L5-S1. Aortic Atherosclerosis (ICD10-I70.0). Electronically Signed   By: Elon Alas M.D.   On: 03/23/2018 15:59   Mr Brain Wo Contrast  Result Date: 03/22/2018 CLINICAL DATA:  Initial evaluation for generalized weakness, vertigo. EXAM: MRI HEAD WITHOUT CONTRAST TECHNIQUE: Multiplanar, multiecho pulse sequences of the brain and surrounding structures were obtained without intravenous contrast. COMPARISON:  Prior MRI from 01/29/2014. FINDINGS: Brain: Moderately advanced cerebral atrophy. Patchy T2/FLAIR hyperintensity within the periventricular and deep white matter both cerebral hemispheres, most consistent with chronic small vessel ischemic change. Superimposed scatter remote lacunar infarcts present within the bilateral cerebral hemispheric white matter. Small remote right cerebellar infarct noted as well. Punctate focus of diffusion abnormality within the deep white matter of the right centrum semi ovale consistent with acute ischemic small vessel type infarct (series 4, image 34). No associated hemorrhage. No other evidence for acute or subacute ischemia. Gray-white matter differentiation otherwise maintained. No evidence for acute or chronic intracranial hemorrhage. No mass lesion, midline shift, or mass effect. Ventriculomegaly with asymmetric dilatation of the left lateral ventricle stable from previous. No hydrocephalus. No extra-axial fluid collection. Normal pituitary gland. Vascular: Major intracranial vascular flow voids maintained. Skull and upper cervical spine: Upper  cervical spine normal. Craniocervical junction normal. Heterogeneous marrow signal intensity again noted. No discrete osseous lesion. No scalp soft tissue abnormality. Sinuses/Orbits: Globes and orbital soft tissues within normal limits. Patient status post lens extraction bilaterally. Paranasal sinuses are clear. Right mastoid effusion with partial opacification of the right middle ear cavity, similar to previous, likely chronic. Visualized nasopharynx clear. Other: None. IMPRESSION: 1. Punctate small vessel type acute ischemic nonhemorrhagic infarct involving the posterior right centrum semi ovale. 2. Moderately advanced cerebral atrophy with mild to moderate chronic small vessel ischemic disease. 3. Chronic right mastoid effusion. Electronically Signed   By: Jeannine Boga M.D.   On: 03/22/2018 21:33    Scheduled Meds: .  stroke: mapping our early stages of recovery book   Does not apply Once  . aspirin  300 mg Rectal Daily   Or  . aspirin  325 mg Oral Daily  . insulin aspart  0-9 Units Subcutaneous Q4H  . ipratropium  0.5 mg Nebulization Q6H  . lipase/protease/amylase  36,000 Units Oral TID WC  . rosuvastatin  20 mg Oral q1800   Continuous Infusions: . lactated ringers       LOS: 1 day   Marylu Lund, MD [facility] Pager [phone]  If 7PM-7AM, please contact night-coverage [email or web address] Password [facility] 03/23/2018, 4:41 PM

## 2018-03-23 NOTE — Evaluation (Signed)
Occupational Therapy Evaluation Patient Details Name: Amanda [name] MRN: [id number] DOB: August 13, 1941 Today's Date: 03/23/2018    History of Present Illness Pt is a 77 year old woman with hx of DM, HTN, peripheral neuropathy, cirrhosis, CAD, CKD, CHF admitted with weakness and inability to walk with onset as she was playing cards with her friends. + incidental finding of a small infarct R centrum semiovale, not believed to be cause of symptoms. Pt with admission to hospital in [place] about 3 months ago, returned home and went to SNF for rehab, but did not like it so left early.   Clinical Impression   Pt typically walks with a cane and is able to care for herself, but is dependent in IADL. She presents with poor activity tolerance, decreased balance and impaired cognition. Pt requires set up to total assist for ADL and transferred with min assist to the BSC>chair>back to bed for transport to CT. Pt will need post acute rehab in SNF and is agreeable to this plan. Will follow acutely.    Follow Up Recommendations  SNF;Supervision/Assistance - 24 hour    Equipment Recommendations       Recommendations for Other Services       Precautions / Restrictions Precautions Precautions: Fall      Mobility Bed Mobility Overal bed mobility: Needs Assistance Bed Mobility: Supine to Sit     Supine to sit: Min guard     General bed mobility comments: increased time, use of rail  Transfers Overall transfer level: Needs assistance Equipment used: Rolling walker (2 wheeled) Transfers: Sit to/from [facility] Sit to Stand: Min assist Stand pivot transfers: Min assist       General transfer comment: steadying assist, cues for hand placement    Balance Overall balance assessment: Needs assistance   Sitting balance-Leahy Scale: Fair Sitting balance - Comments: statically   Standing balance support: Bilateral upper extremity supported Standing balance-Leahy Scale: Poor                              ADL either performed or assessed with clinical judgement   ADL Overall ADL's : Needs assistance/impaired Eating/Feeding: Independent;Bed level   Grooming: Wash/dry face;Wash/dry hands;Sitting;Set up   Upper Body Bathing: Minimal assistance;Sitting   Lower Body Bathing: Maximal assistance;Sit to/from stand   Upper Body Dressing : Minimal assistance;Sitting   Lower Body Dressing: Maximal assistance;Sit to/from stand   Toilet Transfer: Minimal assistance;Stand-pivot;RW;BSC   Toileting- Clothing Manipulation and Hygiene: Total assistance;Sit to/from stand         General ADL Comments: pt with dyspnea, but 02 sats in mid 90s     Vision Baseline Vision/History: Wears glasses Wears Glasses: Reading only Patient Visual Report: No change from baseline       Perception     Praxis      Pertinent Vitals/Pain Pain Assessment: Faces Faces Pain Scale: Hurts little more Pain Location: feet Pain Descriptors / Indicators: Burning Pain Intervention(s): Monitored during session     Hand Dominance Right   Extremity/Trunk Assessment Upper Extremity Assessment Upper Extremity Assessment: Generalized weakness   Lower Extremity Assessment Lower Extremity Assessment: Generalized weakness(burning in feet)   Cervical / Trunk Assessment Cervical / Trunk Assessment: Other exceptions Cervical / Trunk Exceptions: obesity   Communication Communication Communication: No difficulties   Cognition Arousal/Alertness: Awake/alert Behavior During Therapy: WFL for tasks assessed/performed Overall Cognitive Status: Impaired/Different from baseline Area of Impairment: Safety/judgement;Orientation;Memory;Attention;Following commands  Orientation Level: Disoriented to;Time Current Attention Level: Sustained Memory: Decreased short-term memory Following Commands: Follows one step commands with increased time Safety/Judgement: Decreased  awareness of safety     General Comments: scored 17 on MOCA per ST note, per pt she relies on her husband for many cognitive tasks   General Comments       Exercises     Shoulder Instructions      Home Living Family/patient expects to be discharged to:: Private residence Living Arrangements: Spouse/significant other Available Help at Discharge: Family;Available PRN/intermittently(husband works 3 evenings a week) Type of Home: Apartment Home Access: Level entry     Home Layout: One level     Bathroom Shower/Tub: Teacher, early years/pre: Handicapped height     Home Equipment: Environmental consultant - 2 wheels;Kasandra Knudsen - single point      Lives With: Spouse    Prior Functioning/Environment Level of Independence: Independent with assistive device(s);Needs assistance  Gait / Transfers Assistance Needed: uses cane, is supposed to use a 4WW but refuses ADL's / Homemaking Assistance Needed: assist for all IADL, but can perform personal care independently, fatigues easily and cannot reach low cabinets or dryer without LOB            OT Problem List: Decreased strength;Decreased activity tolerance;Impaired balance (sitting and/or standing);Decreased knowledge of use of DME or AE;Decreased cognition;Decreased safety awareness;Obesity      OT Treatment/Interventions: Self-care/ADL training;DME and/or AE instruction;Therapeutic activities;Cognitive remediation/compensation;Patient/family education;Balance training;Therapeutic exercise    OT Goals(Current goals can be found in the care plan section) Acute Rehab OT Goals Patient Stated Goal: to go home after rehab OT Goal Formulation: With patient Time For Goal Achievement: 04/06/18 Potential to Achieve Goals: Good ADL Goals Pt Will Perform Grooming: with supervision;standing Pt Will Perform Upper Body Dressing: with set-up;sitting Pt Will Perform Lower Body Dressing: with min assist;sit to/from stand Pt Will Transfer to Toilet: with  supervision;ambulating;bedside commode Pt Will Perform Toileting - Clothing Manipulation and hygiene: with supervision;sit to/from stand Pt/caregiver will Perform Home Exercise Program: Both right and left upper extremity;Independently;With theraband  OT Frequency: Min 2X/week   Barriers to D/C:            Co-evaluation              AM-PAC PT "6 Clicks" Daily Activity     Outcome Measure Help from another person eating meals?: None Help from another person taking care of personal grooming?: A Little Help from another person toileting, which includes using toliet, bedpan, or urinal?: Total Help from another person bathing (including washing, rinsing, drying)?: A Lot Help from another person to put on and taking off regular upper body clothing?: A Little Help from another person to put on and taking off regular lower body clothing?: Total 6 Click Score: 14   End of Session Equipment Utilized During Treatment: Gait belt;Rolling walker Nurse Communication: Mobility status  Activity Tolerance: Patient limited by fatigue Patient left: in bed;with call bell/phone within reach;with family/visitor present(going to CT)  OT Visit Diagnosis: Unsteadiness on feet (R26.81);Cognitive communication deficit (R41.841);Muscle weakness (generalized) (M62.81)                Time: 7622-6333 OT Time Calculation (min): 70 min Charges:  OT General Charges $OT Visit: 1 Visit OT Evaluation $OT Eval Moderate Complexity: 1 Mod OT Treatments $Self Care/Home Management : 8-22 mins G-Codes:     Apr 08, 2018 Nestor Lewandowsky, OTR/L Pager: [phone] Amanda [name], Haze [place] 2018/04/08, 11:57 AM

## 2018-03-23 NOTE — ED Notes (Signed)
Patient transported to X-ray 

## 2018-03-23 NOTE — Progress Notes (Signed)
  Echocardiogram 2D Echocardiogram has been performed.  Amanda [name] 03/23/2018, 2:23 PM

## 2018-03-23 NOTE — Progress Notes (Signed)
Physical Therapy Evaluation Patient Details Name: Amanda [name] MRN: [id number] DOB: [birth year] Today's Date: 03/23/2018   History of Present Illness  Pt is a 77 year old woman with hx of DM, HTN, peripheral neuropathy, cirrhosis, CAD, CKD, CHF admitted with weakness and inability to walk with onset as she was playing cards with her friends. + incidental finding of a small infarct R centrum semiovale, not believed to be cause of symptoms. Pt with admission to hospital in [place] about 3 months ago, returned home and went to SNF for rehab, but did not like it so left early.  Clinical Impression  Patient is a pleasant 77 y/o female admitted with the above listed diagnosis. Patient reporting prior to admission was deconditioned but able to get around with AD (SPC/RW), but did require assist from husband. Patient today requiring Min A for bed mobility and transfers with patient very limited in ambulation. 1 LOB requiring Min A for steadying and safety. PT recommending short term SNF stay. PT to continue to follow acutely to maximize functional mobility prior to d/c.     Follow Up Recommendations SNF    Equipment Recommendations  None recommended by PT    Recommendations for Other Services       Precautions / Restrictions Precautions Precautions: Fall Restrictions Weight Bearing Restrictions: No      Mobility  Bed Mobility Overal bed mobility: Needs Assistance Bed Mobility: Sit to Supine;Rolling Rolling: Min assist;Min guard     Sit to supine: Min assist   General bed mobility comments: Min A for LE management onto bed  Transfers Overall transfer level: Needs assistance Equipment used: Rolling walker (2 wheeled) Transfers: Sit to/from [facility] Sit to Stand: Min assist Stand pivot transfers: Min assist       General transfer comment: Min A for steadying - 1 LOB requiring physical assist to lower to bed; able to stand for prolonged period for  hygeine  Ambulation/Gait Ambulation/Gait assistance: Min guard;Min assist Ambulation Distance (Feet): 5 Feet Assistive device: Rolling walker (2 wheeled) Gait Pattern/deviations: Step-to pattern;Decreased step length - right;Decreased step length - left     General Gait Details: very limited distance within room with multiple attempts to keep patient on task as she is easily distracable  Stairs            Wheelchair Mobility    Modified Rankin (Stroke Patients Only)       Balance Overall balance assessment: Needs assistance Sitting-balance support: Feet supported;No upper extremity supported Sitting balance-Leahy Scale: Fair     Standing balance support: Bilateral upper extremity supported;During functional activity Standing balance-Leahy Scale: Poor Standing balance comment: 1 LOB requiring Min A for steadying                             Pertinent Vitals/Pain Pain Assessment: Faces Faces Pain Scale: Hurts even more Pain Location: buttocks/rectum(reports due to diarrhea) Pain Descriptors / Indicators: Burning;Discomfort;Grimacing;Moaning Pain Intervention(s): Monitored during session    Home Living Family/patient expects to be discharged to:: Private residence Living Arrangements: Spouse/significant other Available Help at Discharge: Family;Available PRN/intermittently Type of Home: Apartment Home Access: Level entry     Home Layout: One level Home Equipment: Walker - 2 wheels;Cane - single point      Prior Function Level of Independence: Independent with assistive device(s);Needs assistance   Gait / Transfers Assistance Needed: uses cane, is supposed to use a 4WW but refuses  ADL's / [employer]  Needed: reports assist with all daily activities        Hand Dominance   Dominant Hand: Right    Extremity/Trunk Assessment   Upper Extremity Assessment Upper Extremity Assessment: Defer to OT evaluation    Lower Extremity  Assessment Lower Extremity Assessment: Generalized weakness    Cervical / Trunk Assessment Cervical / Trunk Assessment: Other exceptions Cervical / Trunk Exceptions: obesity  Communication   Communication: No difficulties  Cognition Arousal/Alertness: Awake/alert Behavior During Therapy: WFL for tasks assessed/performed Overall Cognitive Status: Impaired/Different from baseline Area of Impairment: Following commands;Safety/judgement;Problem solving                       Following Commands: Follows one step commands inconsistently;Follows one step commands with increased time Safety/Judgement: Decreased awareness of safety   Problem Solving: Slow processing;Decreased initiation;Difficulty sequencing;Requires verbal cues General Comments: scored 17 on MOCA per ST note, per pt she relies on her husband for many cognitive tasks      General Comments      Exercises     Assessment/Plan    PT Assessment Patient needs continued PT services  PT Problem List Decreased strength;Decreased activity tolerance;Decreased balance;Decreased mobility;Decreased knowledge of use of DME;Decreased safety awareness       PT Treatment Interventions DME instruction;Gait training;Functional mobility training;Therapeutic activities;Therapeutic exercise;Balance training;Patient/family education    PT Goals (Current goals can be found in the Care Plan section)  Acute Rehab PT Goals Patient Stated Goal: to go home after rehab PT Goal Formulation: With patient Time For Goal Achievement: 04/06/18 Potential to Achieve Goals: Good    Frequency Min 3X/week   Barriers to discharge        Co-evaluation               AM-PAC PT "6 Clicks" Daily Activity  Outcome Measure Difficulty turning over in bed (including adjusting bedclothes, sheets and blankets)?: Unable Difficulty moving from lying on back to sitting on the side of the bed? : Unable Difficulty sitting down on and standing up  from a chair with arms (e.g., wheelchair, bedside commode, etc,.)?: Unable Help needed moving to and from a bed to chair (including a wheelchair)?: A Little Help needed walking in hospital room?: A Lot Help needed climbing 3-5 steps with a railing? : A Lot 6 Click Score: 10    End of Session Equipment Utilized During Treatment: Gait belt Activity Tolerance: Patient tolerated treatment well;Patient limited by fatigue Patient left: in bed;with call bell/phone within reach;with family/visitor present Nurse Communication: Mobility status PT Visit Diagnosis: Unsteadiness on feet (R26.81);Other abnormalities of gait and mobility (R26.89);Muscle weakness (generalized) (M62.81)    Time: 5681-2751 PT Time Calculation (min) (ACUTE ONLY): 34 min   Charges:   PT Evaluation $PT Eval Moderate Complexity: 1 Mod PT Treatments $Therapeutic Activity: 8-22 mins   PT G Codes:        Lanney Gins, PT, DPT 03/23/18 3:30 PM

## 2018-03-23 NOTE — Progress Notes (Addendum)
Inpatient Diabetes Program Recommendations  AACE/ADA: New Consensus Statement on Inpatient Glycemic Control (2015)  Target Ranges:  Prepandial:   less than 140 mg/dL      Peak postprandial:   less than 180 mg/dL (1-2 hours)      Critically ill patients:  140 - 180 mg/dL   Lab Results  Component Value Date   GLUCAP 172 (H) 03/23/2018   HGBA1C 7.9 (H) 03/23/2018    Review of Glycemic ControlResults for TALER, KUSHNER (MRN [id number]) as of 03/23/2018 12:01  Ref. Range 03/22/2018 21:45 03/23/2018 01:06 03/23/2018 04:48 03/23/2018 08:03  Glucose-Capillary Latest Ref Range: 65 - 99 mg/dL 68 121 (H) 112 (H) 172 (H)    Diabetes history: Type 2 DM  Outpatient Diabetes medications: U500 insulin 85 units breakfast/25 units lunch/ 85 units with supper Current orders for Inpatient glycemic control:  Novolog sensitive q 4 hours  Inpatient Diabetes Program Recommendations:    Note low blood sugar initially, likely from U500 insulin. May consider basal/bolus insulin while in the hospital. Once blood sugars > 150 mg/dL consistently: Consider Lantus 30 units daily and Novolog 6 units tid with meals (hold if patient eats less than 50%).  May need reduction in home doses of insulin at d/c.  Will talk to patient today regarding home glycemic control.    Thanks  Adah Perl, RN, BC-ADM Inpatient Diabetes Coordinator Pager [phone] (8a-5p)   1530:  Spoke with patient and husband regarding DM control.  Asked patient if her blood sugar was low prior to coming to hospital.  She states that she did feel like it was low, however she ate 2 brownies and a piece of hard candy so she did not think this was the cause of her symptoms.  Upon admit her lab blood sugar was 39 mg/dL.  Husband states that U500 insulin was increased to 100 units bid at last MD visit 03/17/18-  May need to reduce U500 at d/c to prevent hypoglycemia.  Husband has been researching CGM options but state that they are too expensive. She is a part  of "[facility]" program to get her U500 insulin and is applying to also get Trulicity through them.  Patient states she is aware of signs and symptoms of low blood sugars and knows how to treat.  Will follow.

## 2018-03-23 NOTE — Progress Notes (Signed)
Patient arrived to unit and transferred to bed. Identified appropriately and assessed. Vital signs stable, orthostatic vitals obtained. Placed on telemetry monitor. Oriented to room and unit, call bell within reach and bed in lowest position. Will continue to monitor.

## 2018-03-23 NOTE — Progress Notes (Signed)
VASCULAR LAB PRELIMINARY  PRELIMINARY  PRELIMINARY  PRELIMINARY  Carotid duplex completed.    Preliminary report:  Unable to visualize right ICA.  1-39% left ICA plaquing. Bilateral vertebral artery flow is antegrade.   Hersel Mcmeen, RVT 03/23/2018, 2:16 PM

## 2018-03-23 NOTE — Care Management Note (Addendum)
Case Management Note  Patient Details  Name: Amanda [name] MRN: [id number] Date of Birth: 11-19-1940  Subjective/Objective:  Dehydration                 Action/Plan: PCP: [name], NP; has private insurance with [employer] with prescription drug coverage; CM following for progression of care.    Expected Discharge Date:   possibly 03/27/2018               Expected Discharge Plan:   possibly short term snf  Discharge planning Services  CM Consult  Status of Service:  In process, will continue to follow  Sherrilyn Rist [phone] 03/23/2018, 2:23 PM

## 2018-03-24 ENCOUNTER — Encounter (HOSPITAL_COMMUNITY): Payer: PPO

## 2018-03-24 ENCOUNTER — Inpatient Hospital Stay (HOSPITAL_COMMUNITY): Payer: PPO

## 2018-03-24 DIAGNOSIS — I340 Nonrheumatic mitral (valve) insufficiency: Secondary | ICD-10-CM

## 2018-03-24 LAB — CBC
HCT: 41.2 % (ref 36.0–46.0)
HEMOGLOBIN: 13 g/dL (ref 12.0–15.0)
MCH: 29.5 pg (ref 26.0–34.0)
MCHC: 31.6 g/dL (ref 30.0–36.0)
MCV: 93.4 fL (ref 78.0–100.0)
PLATELETS: 364 10*3/uL (ref 150–400)
RBC: 4.41 MIL/uL (ref 3.87–5.11)
RDW: 17.1 % — ABNORMAL HIGH (ref 11.5–15.5)
WBC: 19.4 10*3/uL — ABNORMAL HIGH (ref 4.0–10.5)

## 2018-03-24 LAB — BASIC METABOLIC PANEL
Anion gap: 6 (ref 5–15)
BUN: 32 mg/dL — ABNORMAL HIGH (ref 6–20)
CHLORIDE: 114 mmol/L — AB (ref 101–111)
CO2: 23 mmol/L (ref 22–32)
CREATININE: 1.73 mg/dL — AB (ref 0.44–1.00)
Calcium: 8.5 mg/dL — ABNORMAL LOW (ref 8.9–10.3)
GFR calc Af Amer: 31 mL/min — ABNORMAL LOW (ref >60)
GFR calc non Af Amer: 27 mL/min — ABNORMAL LOW (ref >60)
GLUCOSE: 140 mg/dL — AB (ref 65–99)
Potassium: 4.5 mmol/L (ref 3.5–5.1)
Sodium: 143 mmol/L (ref 135–145)

## 2018-03-24 LAB — GLUCOSE, CAPILLARY
Glucose-Capillary: 124 mg/dL — ABNORMAL HIGH (ref 65–99)
Glucose-Capillary: 142 mg/dL — ABNORMAL HIGH (ref 65–99)
Glucose-Capillary: 146 mg/dL — ABNORMAL HIGH (ref 65–99)
Glucose-Capillary: 164 mg/dL — ABNORMAL HIGH (ref 65–99)
Glucose-Capillary: 173 mg/dL — ABNORMAL HIGH (ref 65–99)
Glucose-Capillary: 215 mg/dL — ABNORMAL HIGH (ref 65–99)

## 2018-03-24 MED ORDER — LACTATED RINGERS IV SOLN
INTRAVENOUS | Status: AC
  Administered 2018-03-24: 12:00:00 via INTRAVENOUS

## 2018-03-24 MED ORDER — HYDRALAZINE HCL 20 MG/ML IJ SOLN
5.0000 mg | INTRAMUSCULAR | Status: DC | PRN
Start: 2018-03-24 — End: 2018-03-26
  Administered 2018-03-25: 5 mg via INTRAVENOUS
  Filled 2018-03-24 (×2): qty 1

## 2018-03-24 MED ORDER — GABAPENTIN 100 MG PO CAPS
100.0000 mg | ORAL_CAPSULE | Freq: Two times a day (BID) | ORAL | Status: DC
  Administered 2018-03-24 – 2018-03-26 (×5): 100 mg via ORAL
  Filled 2018-03-24 (×5): qty 1

## 2018-03-24 MED ORDER — RUXOLITINIB PHOSPHATE 25 MG PO TABS
25.0000 mg | ORAL_TABLET | Freq: Two times a day (BID) | ORAL | Status: DC
  Administered 2018-03-25 – 2018-03-26 (×3): 25 mg via ORAL
  Filled 2018-03-24 (×4): qty 1

## 2018-03-24 NOTE — Consult Note (Signed)
[facility] CM Primary Care Navigator  03/24/2018  Amanda [name] 26-May-1941 [id number]   Met withpatientandhusband ([name])at the bedside to identify possible discharge needs. Patientreports not being able to move due to weakness and some numbness on both lower extremities (left weaker than right) that resultedtothis admission.(CVA, acute kidney injury, dyspnea/ COPD)  Patientendorses Diplomatic Services operational officer with Therapist, music at [employer].    [facility] [address] [employer] [address] obtain medications without any problem.  Patienthas beenmanaginghermedications at [employer] assistance, straight out of the containers.  Patient reports that husband has been providing transportationto herdoctors' appointments.  Patientliveswithhusband whoservesas theprimary caregiverat home and daughter Shirlean Mylar- lives nearby) assists with their needs.  Anticipateddischargeplan isskilled nursing facility (SNF- in process) for short term rehabilitation per therapy recommendation.  Patient andhusbandvoiced understanding to call primary care provider's office whenshereturnsbackhome, for a post discharge follow-upvisitwithin1- 2weeksor sooner if needs arise. Patient letter (with PCP's contact number) was provided astheirreminder.   Discussed with patientandhusbandregarding [facility] CM services available for health management at home but she denies needing any services for now. She states being capable of managing  her health issues and has been doing it for 9 years. Patientand husband expressed understandingof needto seek referral from primary care provider to [facility] care management if deemed necessary and appropriate for any services in thenear future.   [facility] care management information was provided for future needs thatshemay have.  Primary care provider's officeis listed  as providing transition of care (TOC) follow-up.    For additional questions please contact:  Edwena Felty A. Beatrix Breece, BSN, RN-BC [facility] PRIMARY CARE Navigator Cell: [phone]

## 2018-03-24 NOTE — Progress Notes (Signed)
Physical Therapy Treatment Patient Details Name: Amanda [name] MRN: [id number] DOB: 27-Aug-1941 Today's Date: 03/24/2018    History of Present Illness Pt is a 77 year old woman with hx of DM, HTN, peripheral neuropathy, cirrhosis, CAD, CKD, CHF admitted with weakness and inability to walk with onset as she was playing cards with her friends. + incidental finding of a small infarct R centrum semiovale, not believed to be cause of symptoms. Pt with admission to hospital in [place] about 3 months ago, returned home and went to SNF for rehab, but did not like it so left early.    PT Comments    Patient progressing this visit, increasing ambulation distance into hallway 50' with min guard. Pt's legs fatigue quickly with mild dyspnea with deconditioning. Pt requires most physical assistance during transfers and will benefit from continued cueing for safety with RW. SNF recs still appropriate. Pt will cont to follow and progress as tolerated.     Follow Up Recommendations  SNF     Equipment Recommendations  None recommended by PT    Recommendations for Other Services       Precautions / Restrictions Precautions Precautions: Fall Restrictions Weight Bearing Restrictions: No    Mobility  Bed Mobility               General bed mobility comments: Sittng EOB at entry  Transfers Overall transfer level: Needs assistance Equipment used: Rolling walker (2 wheeled) Transfers: Sit to/from [facility] Sit to Stand: Min assist Stand pivot transfers: Min assist       General transfer comment: Min A to power, patient with poor handplacment on RW, cues for techinque.   Ambulation/Gait Ambulation/Gait assistance: Min guard Ambulation Distance (Feet): 50 Feet Assistive device: Rolling walker (2 wheeled) Gait Pattern/deviations: Step-to pattern;Decreased step length - right;Decreased step length - left Gait velocity: decreased   General Gait Details: Patient progressing  activity this session, ambulating into hallway and back. legs fatiguq quickly SpO2 and HR WNL after activity on RA   Stairs             Wheelchair Mobility    Modified Rankin (Stroke Patients Only)       Balance Overall balance assessment: Needs assistance Sitting-balance support: Feet supported;No upper extremity supported Sitting balance-Leahy Scale: Fair     Standing balance support: Bilateral upper extremity supported;During functional activity Standing balance-Leahy Scale: Poor                              Cognition Arousal/Alertness: Awake/alert Behavior During Therapy: WFL for tasks assessed/performed                                          Exercises      General Comments        Pertinent Vitals/Pain Pain Assessment: No/denies pain    Home Living                      Prior Function            PT Goals (current goals can now be found in the care plan section) Acute Rehab PT Goals Patient Stated Goal: to go home after rehab PT Goal Formulation: With patient Time For Goal Achievement: 04/06/18 Potential to Achieve Goals: Good Progress towards PT goals: Progressing toward goals  Frequency    Min 3X/week      PT Plan Current plan remains appropriate    Co-evaluation              AM-PAC PT "6 Clicks" Daily Activity  Outcome Measure  Difficulty turning over in bed (including adjusting bedclothes, sheets and blankets)?: Unable Difficulty moving from lying on back to sitting on the side of the bed? : Unable Difficulty sitting down on and standing up from a chair with arms (e.g., wheelchair, bedside commode, etc,.)?: Unable Help needed moving to and from a bed to chair (including a wheelchair)?: A Little Help needed walking in hospital room?: A Lot Help needed climbing 3-5 steps with a railing? : A Lot 6 Click Score: 10    End of Session Equipment Utilized During Treatment: Gait  belt Activity Tolerance: Patient tolerated treatment well;Patient limited by fatigue Patient left: in bed;with call bell/phone within reach;with family/visitor present Nurse Communication: Mobility status PT Visit Diagnosis: Unsteadiness on feet (R26.81);Other abnormalities of gait and mobility (R26.89);Muscle weakness (generalized) (M62.81)     Time: 1415-1440 PT Time Calculation (min) (ACUTE ONLY): 25 min  Charges:  $Gait Training: 8-22 mins                    G Codes:      Reinaldo Berber, PT, DPT Acute Rehab Services Pager: [phone]   Reinaldo Berber 03/24/2018, 2:43 PM

## 2018-03-24 NOTE — Progress Notes (Signed)
  Echocardiogram 2D Echocardiogram has been performed.  Amanda [name] 03/24/2018, 4:24 PM

## 2018-03-24 NOTE — NC FL2 (Signed)
[place] LEVEL OF CARE SCREENING TOOL     IDENTIFICATION  Patient Name: Amanda [name] Birthdate: 1941-04-15 Sex: female Admission Date (Current Location): 03/22/2018  [facility] and [place] Number:  Herbalist and Address:  [facility] [place] [address], [place], [place] [address]      Provider Number: [id number]  Attending Physician Name and Address:  Donne Hazel, MD  Relative Name and Phone Number:  Shirlean Mylar, daughter, [phone]    Current Level of Care: Hospital Recommended Level of Care: [place] Prior Approval Number:    Date Approved/Denied:   PASRR Number: [id number]  Discharge Plan: SNF    Current Diagnoses: Patient Active Problem List   Diagnosis Date Noted  . Dehydration 03/22/2018  . AKI (acute kidney injury) ([place]) 03/22/2018  . CVA (cerebral vascular accident) ([place]) 03/22/2018  . Hip injury, left, subsequent encounter 11/08/2017  . Iron deficiency anemia 12/22/2016  . OSA (obstructive sleep apnea) 04/21/2016  . Osteopenia 04/07/2016  . Lung nodule, solitary 02/05/2016  . Aortic dilatation ([place]) 02/05/2016  . Dyspnea 01/28/2016  . Allergic rhinitis 01/23/2016  . Hepatic cirrhosis ([place]) 10/28/2015  . Irritable bowel syndrome 08/12/2015  . Obesity (BMI 30-39.9) 04/30/2015  . Diabetic retinopathy of both eyes ([place]) 04/18/2015  . Former smoker 04/18/2015  . GAD (generalized anxiety disorder) 04/18/2015  . Status post primary angioplasty with coronary stent 04/18/2015  . Coronary artery disease involving native coronary artery 03/01/2015  . Acute diastolic heart failure ([place]) 02/01/2015  . ST elevation myocardial infarction (STEMI) involving left circumflex coronary artery in recovery phase ([place]) 01/27/2015  . Idioventricular rhythm ([place])   . Diabetic peripheral neuropathy associated with type 2 diabetes mellitus ([place]) 01/23/2015  . Type 2 diabetes mellitus with diabetic polyneuropathy ([place])  01/23/2015  . Thrombocytosis ([place]) 07/04/2014  . Cholelithiasis 02/07/2014  . Chronic diarrhea 01/29/2014  . [name] ([place]) 05/31/2013  . Essential hypertension 05/31/2013  . History of Bell's palsy 05/26/2013  . DERMATITIS, ATOPIC 11/16/2010  . DIZZINESS 11/16/2010  . DIASTOLIC DYSFUNCTION 93/57/0177  . Hyperlipidemia 12/15/2009  . Essential hypertension, benign 11/24/2009  . PSORIASIS 11/24/2009  . Depression 09/13/2009  . 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[place]) 07/08/2008    Orientation RESPIRATION BLADDER Height & Weight     Self, Time, Situation, Place  Normal Continent Weight: 100.4 kg (221 lb 5.5 oz) Height:  5' 3"  (160 cm)  BEHAVIORAL SYMPTOMS/MOOD NEUROLOGICAL BOWEL NUTRITION STATUS      Continent Diet(Please see DC Summary)  AMBULATORY STATUS COMMUNICATION OF NEEDS Skin   Limited Assist Verbally Normal                       Personal Care Assistance Level of Assistance  Bathing, Feeding, Dressing Bathing Assistance: Maximum assistance Feeding assistance: Independent Dressing Assistance: Limited assistance     Functional Limitations Info  Sight, Hearing, Speech Sight Info: Adequate Hearing Info: Adequate Speech Info: Adequate    SPECIAL CARE FACTORS FREQUENCY  PT (By licensed PT)     PT Frequency: 5x/week              Contractures      Additional Factors Info  Code Status, Allergies, Isolation Precautions, Insulin Sliding Scale Code Status Info: DNR Allergies Info: Lisinopril, Tape, Doxycycline, Latex   Insulin Sliding Scale Info: Every 4 hours Isolation Precautions Info: MRSA in the nose     Current Medications (03/24/2018):  This is the current hospital  active medication list Current Facility-Administered Medications  Medication Dose Route Frequency Provider Last Rate Last Dose  .  stroke: mapping our early stages of recovery book   Does not apply Once Toy Baker, MD      . acetaminophen (TYLENOL) tablet  650 mg  650 mg Oral Q4H PRN Toy Baker, MD       Or  . acetaminophen (TYLENOL) solution 650 mg  650 mg Per Tube Q4H PRN Doutova, Anastassia, MD       Or  . acetaminophen (TYLENOL) suppository 650 mg  650 mg Rectal Q4H PRN Doutova, Anastassia, MD      . amLODipine (NORVASC) tablet 5 mg  5 mg Oral Daily Donne Hazel, MD   5 mg at 03/24/18 5956  . aspirin suppository 300 mg  300 mg Rectal Daily Doutova, Anastassia, MD       Or  . aspirin tablet 325 mg  325 mg Oral Daily Doutova, Anastassia, MD   325 mg at 03/23/18 0830  . hydrALAZINE (APRESOLINE) injection 5 mg  5 mg Intravenous Q4H PRN Donne Hazel, MD      . insulin aspart (novoLOG) injection 0-9 Units  0-9 Units Subcutaneous Q4H Toy Baker, MD   1 Units at 03/24/18 3875  . levalbuterol (XOPENEX) nebulizer solution 0.63 mg  0.63 mg Nebulization Q4H PRN Doutova, Anastassia, MD      . lipase/protease/amylase (CREON) capsule 36,000 Units  36,000 Units Oral TID WC Toy Baker, MD   36,000 Units at 03/23/18 0829  . loperamide (IMODIUM) capsule 2 mg  2 mg Oral PRN Donne Hazel, MD   2 mg at 03/23/18 1756  . rosuvastatin (CRESTOR) tablet 20 mg  20 mg Oral q1800 Toy Baker, MD   20 mg at 03/23/18 1731  . ruxolitinib phosphate (JAKAFI) tablet 25 mg  25 mg Oral BID Donne Hazel, MD         Discharge Medications: Please see discharge summary for a list of discharge medications.  Relevant Imaging Results:  Relevant Lab Results:   Additional Information SSN: [place], [place]

## 2018-03-24 NOTE — Clinical Social Work Note (Signed)
Clinical Social Work Assessment  Patient Details  Name: Amanda [name] MRN: [id number] Date of Birth: 19-Jul-1941  Date of referral:  03/24/18               Reason for consult:  Facility Placement                Permission sought to share information with:  Facility Sport and exercise psychologist, Family Supports Permission granted to share information::  Yes, Verbal Permission Granted  Name::     Robin  Agency::  SNFs  Relationship::  Daughter  Contact Information:  [phone]  Housing/Transportation Living arrangements for the past 2 months:  Single Family Home Source of Information:  Patient, Adult Children Patient Interpreter Needed:  None Criminal Activity/Legal Involvement Pertinent to Current Situation/Hospitalization:  No - Comment as needed Significant Relationships:  Adult Children, Spouse Lives with:  Spouse Do you feel safe going back to the place where you live?  No Need for family participation in patient care:  No (Coment)  Care giving concerns:  CSW received consult for possible SNF placement at time of discharge. CSW spoke with patient and daughter at bedside regarding PT recommendation of SNF placement at time of discharge. Patient reported that patient's spouse is currently unable to care for patient at their home given patient's current physical needs and fall risk. Patient expressed understanding of PT recommendation and is agreeable to SNF placement at time of discharge. CSW to continue to follow and assist with discharge planning needs.   Social Worker assessment / plan:  CSW spoke with patient concerning possibility of rehab at [facility] before returning home.  Employment status:  Retired Surveyor, [employer] Care PT Recommendations:  [place] / Referral to community resources:  [place]  Patient/Family's Response to care:  Patient recognizes need for rehab before returning home and is agreeable to a SNF in [place]. Patient reported preference for [facility]. She went to [facility] recently and only stayed one day because they told her to "stip down and show them how she bathes herself". She stated she did not want to be treated like a child. If [facility] cannot accept her, daughter states she can return home with home health.   Patient/Family's Understanding of and Emotional Response to Diagnosis, Current Treatment, and Prognosis:  Patient/family is realistic regarding therapy needs and expressed being hopeful for SNF placement. Patient and daughter expressed understanding of CSW role and discharge process as well as medical condition. No questions/concerns about plan or treatment.    Emotional Assessment Appearance:  Appears stated age Attitude/Demeanor/Rapport:  Complaining Affect (typically observed):  Accepting, Appropriate, Frustrated Orientation:  Oriented to Self, Oriented to Place, Oriented to  Time, Oriented to Situation Alcohol / Substance use:  Not Applicable Psych involvement (Current and /or in the community):  No (Comment)  Discharge Needs  Concerns to be addressed:  Care Coordination Readmission within the last 30 days:  No Current discharge risk:  None Barriers to Discharge:  Continued Medical Work up   [employer], [place] 03/24/2018, 10:21 AM

## 2018-03-24 NOTE — Progress Notes (Signed)
Patient noticed a bruise on her left forearm after removing a dressing from a lab draw.  Patient stated that the site has been painful all day and was very upset. She pointed out all the bruising on her arms that she states are from lab draws and stated to the nurse that she will refuse to let anyone take any more blood. RN notified on-call NP C. Bodenheimer. Will continue to monitor patient.

## 2018-03-24 NOTE — Progress Notes (Signed)
PROGRESS NOTE    Amanda [name]  HUT:[id number] DOB: [birth year] DOA: 03/22/2018 PCP: [name], NP    Brief Narrative:  77 y.o. female with medical history significant of DM2, CAD, CKD, HLD, Cirrosis, HTN, Polycyhemia diastolic CHF.  Presented with has been having intermittent lightheadedness and dizziness for past few weeks.  Today she stood up from playing cards and could not move her legs she has been having some numbness and tingling in her toes for history of diabetic neuropathy.  Has not been eating drinking well.  No fevers chills no chest pain or shortness of breath no nausea vomiting has chronic diarrhea. She has not been feeling well for the past 4 days.  She has chronic dyspnea on exertion. She is a former smoker  On further questioning, patient states that 3 months prior to this hospital admission, patient was admitted to hospital in [place] for reported pneumonia.  Patient was essentially bedbound for approximately 8 days.  Patient ultimately left the hospital with what seemed like leaving [place] at home to [facility].  Since that hospital discharge, patient reports being markedly weak in her legs with difficulty ambulating.  Over the past several months, patient essentially was either chair or bed bound with difficulty performing ADLs.  At some point, patient was referred to [facility] facility for rehab, however she left 1-1/2 days into her stay secondary to not enjoying her stay there and thus never completed physical therapy.  Patient has otherwise not undergone any other kind of physical therapy afterwards.   Assessment & Plan:   Active Problems:   Hyperlipidemia   [name] ([place])   Essential hypertension   Thrombocytosis (HCC)   Diabetic peripheral neuropathy associated with type 2 diabetes mellitus ([place])   Coronary artery disease involving native coronary artery   Hepatic cirrhosis (HCC)   OSA (obstructive sleep  apnea)   Chronic diarrhea   Type 2 diabetes mellitus with diabetic polyneuropathy ([place])   Dehydration   AKI (acute kidney injury) ([place])   CVA (cerebral vascular accident) ([place])  . Lower extremity weakness  with small infarct seen on MRI -  -Neurology consulted.  Recommendations noted -Infarcts seen on MRI likely not responsible for presenting lower extremity weakness per neurology. -.  Bilateral carotid Dopplers reviewed, no significant stenosis noted -Echocardiogram data reviewed.  Unremarkable study.  Bubble study was difficult to interpret. -Patient remains neurologically intact -Had ordered CT lumbar spine, unremarkable.  Reviewed -Per history, patient was essentially bedbound during a previous hospital stay for over 1 week months ago.  Since then, patient has essentially been either bedbound or chair bound with limited ADLs. -Patient seen by physical therapy, recommendations for skilled nursing facility placement.  Dyspnea -  -Initial chest x-ray reviewed.  Findings consistent with possible edema. -Patient underwent follow-up CT chest with findings assistant with underlying COPD -Patient is continued on as needed Xopenex, will continue -Presently on minimal O2 support  . Dehydration  -continue IV fluid hydration as tolerated  . AKI (acute kidney injury) ([place])  -Patient is continuing IV fluid hydration. -Repeat basic metabolic panel in the morning -Renal function is improving  . Chronic diarrhea  -Stable at present -Patient will need to continue follow-up with GI -Improved with Imodium  . Coronary artery disease involving native coronary artery  - chronic, continue aspirin  and statin    -Patient without chest pains at present  . Diabetic peripheral neuropathy associated with type 2 diabetes mellitus ([place]) -Current  regimen as tolerated -We will continue with sliding scale insulin as needed  . Essential hypertension  -We will resume Norvasc -Hold Cozaar secondary to  concerns of acute renal failure -Stable at present  . Hepatic cirrhosis (HCC) chronic  -no evidence of thrombocytopenia, albumin stable  -Continue to monitor for now.  . Hyperlipidemia  -Appears to be stable  -Will continue home medications   . [name] ([place])  -Patient noted to be on Jakafi 25 mg PO BID will need to be dose adjusted given worsening renal function.   -Evidence of rising white blood cell count patient has been followed by hematology given progressive rise will need to have close follow-up -CBC profile much improved -Repeat CBC in the morning  . Thrombocytosis ([place])  -Continue with follow-up of oncology as outpatient  DVT prophylaxis: scd Code Status: DNR Family Communication: Pt in room, family at bedside Disposition Plan: Uncertain at this time  Consultants:   Neurology  Procedures:     Antimicrobials: Anti-infectives (From admission, onward)   None      Subjective: Reports feeling better today.  Complaining of numbness and tingling involving lower extremity.  Objective: Vitals:   03/23/18 2027 03/23/18 2107 03/24/18 0533 03/24/18 1430  BP:  (!) 168/76 (!) 173/59 (!) 173/77  Pulse:  96 87 92  Resp:  (!) 22 20 20   Temp:  98.5 F (36.9 C) 98.1 F (36.7 C) 98.3 F (36.8 C)  TempSrc:  Oral  Oral  SpO2: 98% 95% 94%   Weight:      Height:        Intake/Output Summary (Last 24 hours) at 03/24/2018 1734 Last data filed at 03/24/2018 0300 Gross per 24 hour  Intake 592.5 ml  Output -  Net 592.5 ml   Filed Weights   03/22/18 1744 03/23/18 0045  Weight: 97.5 kg (215 lb) 100.4 kg (221 lb 5.5 oz)    Examination: General exam: Conversant, in no acute distress Respiratory system: normal chest rise, clear, no audible wheezing Cardiovascular system: regular rhythm, s1-s2 Gastrointestinal system: Nondistended, nontender, pos BS Central nervous system: No seizures, no tremors Extremities: No cyanosis, no joint deformities Skin: No rashes,  no pallor Psychiatry: Affect normal // no auditory hallucinations   Data Reviewed: I have personally reviewed following labs and imaging studies  CBC: Recent Labs  Lab 03/22/18 1845 03/22/18 2013 03/24/18 0606  WBC 46.2*  --  19.4*  NEUTROABS 40.1*  --   --   HGB 14.7 16.0* 13.0  HCT 46.3* 47.0* 41.2  MCV 93.0  --  93.4  PLT 719*  --  154   Basic Metabolic Panel: Recent Labs  Lab 03/22/18 2013 03/22/18 2157 03/24/18 0606  NA 139 140 143  K 6.5* 4.1 4.5  CL 106 112* 114*  CO2  --  18* 23  GLUCOSE 39* 71 140*  BUN 64* 40* 32*  CREATININE 2.70* 2.50* 1.73*  CALCIUM  --  8.7* 8.5*   GFR: Estimated Creatinine Clearance: 30.3 mL/min (A) (by C-G formula based on SCr of 1.73 mg/dL (H)). Liver Function Tests: Recent Labs  Lab 03/22/18 2157  AST 36  ALT 10*  ALKPHOS 125  BILITOT 1.1  PROT 6.3*  ALBUMIN 3.7   No results for input(s): LIPASE, AMYLASE in the last 168 hours. No results for input(s): AMMONIA in the last 168 hours. Coagulation Profile: Recent Labs  Lab 03/23/18 0221  INR 1.36   Cardiac Enzymes: Recent Labs  Lab 03/23/18 0221  TROPONINI 0.04*  BNP (last 3 results) No results for input(s): PROBNP in the last 8760 hours. HbA1C: Recent Labs    03/23/18 0221  HGBA1C 7.9*   CBG: Recent Labs  Lab 03/24/18 0013 03/24/18 0402 03/24/18 0755 03/24/18 1211 03/24/18 1612  GLUCAP 142* 124* 146* 215* 173*   Lipid Profile: Recent Labs    03/23/18 0221  CHOL 167  HDL 25*  LDLCALC 93  TRIG 247*  CHOLHDL 6.7   Thyroid Function Tests: No results for input(s): TSH, T4TOTAL, FREET4, T3FREE, THYROIDAB in the last 72 hours. Anemia Panel: No results for input(s): VITAMINB12, FOLATE, FERRITIN, TIBC, IRON, RETICCTPCT in the last 72 hours. Sepsis Labs: No results for input(s): PROCALCITON, LATICACIDVEN in the last 168 hours.  Recent Results (from the past 240 hour(s))  MRSA PCR Screening     Status: Abnormal   Collection Time: 03/23/18  5:36 PM    Result Value Ref Range Status   MRSA by PCR POSITIVE (A) NEGATIVE Final    Comment:        The GeneXpert MRSA Assay (FDA approved for NASAL specimens only), is one component of a comprehensive MRSA colonization surveillance program. It is not intended to diagnose MRSA infection nor to guide or monitor treatment for MRSA infections. RESULT CALLED TO, READ BACK BY AND VERIFIED WITHLoletha Grayer [facility] RN 2044 03/23/18 A BROWNING Performed at [facility], [place] [address], [place], [place] [address]      Radiology Studies: Dg Chest 2 View  Result Date: 03/23/2018 CLINICAL DATA:  Acute onset of dyspnea. EXAM: CHEST - 2 VIEW COMPARISON:  Chest radiograph performed 01/10/2018, and CTA of the chest performed 01/28/2016 FINDINGS: The lungs are well-aerated. Vascular congestion is noted. Mildly increased interstitial markings may reflect mild interstitial edema. No significant interstitial lung disease was seen on prior CTs. There is no evidence of pleural effusion or pneumothorax. The heart is borderline normal in size. No acute osseous abnormalities are seen. IMPRESSION: Vascular congestion noted. Mildly increased interstitial markings may reflect mild interstitial edema. Electronically Signed   By: Garald Balding M.D.   On: 03/23/2018 00:25   Ct Chest Wo Contrast  Result Date: 03/23/2018 CLINICAL DATA:  Chronic dyspnea.  Smoker. EXAM: CT CHEST WITHOUT CONTRAST TECHNIQUE: Multidetector CT imaging of the chest was performed following the standard protocol without IV contrast. COMPARISON:  Chest radiographs obtained earlier today. Chest CTA dated 01/28/2016. Abdomen and pelvis CT dated 01/04/2018. FINDINGS: Cardiovascular: Atheromatous calcifications, including the coronary arteries and aorta. The ascending thoracic aorta continues to measure 4.1 cm in maximum diameter. Mediastinum/Nodes: No enlarged mediastinal or axillary lymph nodes. Thyroid gland, trachea, and esophagus demonstrate no significant  findings. Lungs/Pleura: Minimal bilateral bullous changes. The previously demonstrated 7 x 6 mm superior segment right lower lobe nodule measures 6 x 5 mm on image number 74 series 4. No new lung nodules. No pleural fluid. Upper Abdomen: Changes of cirrhosis of the liver with irregular contours enlarged lateral segment left lobe and caudate lobe with a relatively small right lobe. The spleen is enlarged. Small to moderate amount of free peritoneal fluid. Musculoskeletal: Thoracic and lower cervical spine degenerative changes. IMPRESSION: 1. 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 Recommend annual imaging followup by CTA or MRA. This recommendation follows 2010 ACCF/AHA/AATS/ACR/ASA/SCA/SCAI/SIR/STS/SVM Guidelines for the Diagnosis and Management of Patients with Thoracic Aortic Disease. Circulation. 2010; 121: G401-U272 2. Stable to slightly decreased size of the previously demonstrated right lower lobe pulmonary nodule. This is  compatible with a benign nodule. 3.  Calcific coronary artery and aortic atherosclerosis. 4. Mild changes of COPD with centrilobular and paraseptal emphysema. Aortic Atherosclerosis (ICD10-I70.0) and Emphysema (ICD10-J43.9). Electronically Signed   By: Claudie Revering M.D.   On: 03/23/2018 13:51   Ct Lumbar Spine Wo Contrast  Addendum Date: 03/24/2018   ADDENDUM REPORT: 03/24/2018 13:35 ADDENDUM: Additional reformats of the lumbar spine submitted affirming prior report. Electronically Signed   By: Elon Alas M.D.   On: 03/24/2018 13:35   Result Date: 03/24/2018 CLINICAL DATA:  Dyspnea, radiculopathy for greater than 6 weeks. History of cirrhosis. EXAM: CT LUMBAR SPINE WITHOUT CONTRAST TECHNIQUE: Multidetector CT imaging of the lumbar spine was performed without intravenous contrast administration. Multiplanar CT image reconstructions were also generated. COMPARISON:  CT under pelvis January 04, 2018 FINDINGS:  SEGMENTATION: For the purposes of this report the last well-formed intervertebral disc space is reported as L5-S1. ALIGNMENT: Maintained lumbar lordosis. No malalignment. VERTEBRAE: Vertebral bodies and posterior elements are intact (L1 incompletely imaged). Intervertebral disc heights preserved, mild endplate spurring. No destructive bony lesions. L4 stable hemangioma. PARASPINAL AND OTHER SOFT TISSUES: Splenomegaly partially imaged. Partially imaged ascites. Moderate calcific atherosclerosis aortoiliac vessels. DISC LEVELS: L1-2: No disc bulge, canal stenosis nor neural foraminal narrowing. L2-3: Mild endplate spurring, mild to moderate right greater than left facet arthropathy. No canal stenosis. Mild to moderate right neural foraminal narrowing. L3-4: Small broad-based disc osteophyte complex. Mild-to-moderate facet arthropathy and ligamentum flavum redundancy. No canal stenosis. Mild to moderate bilateral neural foraminal narrowing. L4-5: Moderate broad-based disc osteophyte complex. Moderate facet arthropathy and ligamentum flavum redundancy. Mild canal stenosis. Mild right, moderate left neural foraminal narrowing. L5-S1: Moderate broad-based disc osteophyte complex. Severe facet arthropathy. No canal stenosis. Moderate right, severe left neural foraminal narrowing. IMPRESSION: 1. No fracture or malalignment. 2. Degenerative change of the lumbar spine. Mild canal stenosis L4-5. 3. Normal foraminal narrowing L2-3 through L5-S1: Severe on the left at L5-S1. Aortic Atherosclerosis (ICD10-I70.0). Electronically Signed: By: Elon Alas M.D. On: 03/23/2018 15:59   Mr Brain Wo Contrast  Result Date: 03/22/2018 CLINICAL DATA:  Initial evaluation for generalized weakness, vertigo. EXAM: MRI HEAD WITHOUT CONTRAST TECHNIQUE: Multiplanar, multiecho pulse sequences of the brain and surrounding structures were obtained without intravenous contrast. COMPARISON:  Prior MRI from 01/29/2014. FINDINGS: Brain:  Moderately advanced cerebral atrophy. Patchy T2/FLAIR hyperintensity within the periventricular and deep white matter both cerebral hemispheres, most consistent with chronic small vessel ischemic change. Superimposed scatter remote lacunar infarcts present within the bilateral cerebral hemispheric white matter. Small remote right cerebellar infarct noted as well. Punctate focus of diffusion abnormality within the deep white matter of the right centrum semi ovale consistent with acute ischemic small vessel type infarct (series 4, image 34). No associated hemorrhage. No other evidence for acute or subacute ischemia. Gray-white matter differentiation otherwise maintained. No evidence for acute or chronic intracranial hemorrhage. No mass lesion, midline shift, or mass effect. Ventriculomegaly with asymmetric dilatation of the left lateral ventricle stable from previous. No hydrocephalus. No extra-axial fluid collection. Normal pituitary gland. Vascular: Major intracranial vascular flow voids maintained. Skull and upper cervical spine: Upper cervical spine normal. Craniocervical junction normal. Heterogeneous marrow signal intensity again noted. No discrete osseous lesion. No scalp soft tissue abnormality. Sinuses/Orbits: Globes and orbital soft tissues within normal limits. Patient status post lens extraction bilaterally. Paranasal sinuses are clear. Right mastoid effusion with partial opacification of the right middle ear cavity, similar to previous, likely chronic. Visualized nasopharynx clear. Other: None. IMPRESSION: 1.  Punctate small vessel type acute ischemic nonhemorrhagic infarct involving the posterior right centrum semi ovale. 2. Moderately advanced cerebral atrophy with mild to moderate chronic small vessel ischemic disease. 3. Chronic right mastoid effusion. Electronically Signed   By: Jeannine Boga M.D.   On: 03/22/2018 21:33    Scheduled Meds: .  stroke: mapping our early stages of recovery book    Does not apply Once  . amLODipine  5 mg Oral Daily  . aspirin  300 mg Rectal Daily   Or  . aspirin  325 mg Oral Daily  . gabapentin  100 mg Oral BID  . insulin aspart  0-9 Units Subcutaneous Q4H  . lipase/protease/amylase  36,000 Units Oral TID WC  . rosuvastatin  20 mg Oral q1800  . ruxolitinib phosphate  25 mg Oral BID   Continuous Infusions: . lactated ringers 75 mL/hr at 03/24/18 1225     LOS: 2 days   Marylu Lund, MD [facility] Pager [phone]  If 7PM-7AM, please contact night-coverage [email or web address] Password [email or web address] 03/24/2018, 5:34 PM

## 2018-03-24 NOTE — Progress Notes (Signed)
Pt brought down for MRA of the brain.  Pt had a brain MRI 2 days ago and she said she was claustrophobic but could probably do it if she had a cloth over her eyes.  Pt was put in the scanner with a blindfold and shortly thereafter started screaming "Get me out of here!  Get me out of here, I can't stand it!!"  Pt was taken out of the scanner immediately.  Pt refused to continue.  Pt put back into her bed and sent upstairs.

## 2018-03-25 ENCOUNTER — Encounter (HOSPITAL_COMMUNITY): Payer: Self-pay | Admitting: Radiology

## 2018-03-25 ENCOUNTER — Encounter (HOSPITAL_COMMUNITY): Payer: PPO

## 2018-03-25 ENCOUNTER — Inpatient Hospital Stay (HOSPITAL_COMMUNITY): Payer: PPO

## 2018-03-25 LAB — CBC
HCT: 39 % (ref 36.0–46.0)
HEMOGLOBIN: 12.3 g/dL (ref 12.0–15.0)
MCH: 30.1 pg (ref 26.0–34.0)
MCHC: 31.5 g/dL (ref 30.0–36.0)
MCV: 95.4 fL (ref 78.0–100.0)
Platelets: 330 10*3/uL (ref 150–400)
RBC: 4.09 MIL/uL (ref 3.87–5.11)
RDW: 16.6 % — ABNORMAL HIGH (ref 11.5–15.5)
WBC: 15.6 10*3/uL — ABNORMAL HIGH (ref 4.0–10.5)

## 2018-03-25 LAB — GLUCOSE, CAPILLARY
GLUCOSE-CAPILLARY: 107 mg/dL — AB (ref 65–99)
GLUCOSE-CAPILLARY: 123 mg/dL — AB (ref 65–99)
GLUCOSE-CAPILLARY: 124 mg/dL — AB (ref 65–99)
GLUCOSE-CAPILLARY: 125 mg/dL — AB (ref 65–99)
Glucose-Capillary: 140 mg/dL — ABNORMAL HIGH (ref 65–99)
Glucose-Capillary: 124 mg/dL — ABNORMAL HIGH (ref 65–99)
Glucose-Capillary: 162 mg/dL — ABNORMAL HIGH (ref 65–99)

## 2018-03-25 LAB — BASIC METABOLIC PANEL
ANION GAP: 10 (ref 5–15)
BUN: 27 mg/dL — ABNORMAL HIGH (ref 6–20)
CALCIUM: 8.4 mg/dL — AB (ref 8.9–10.3)
CHLORIDE: 112 mmol/L — AB (ref 101–111)
CO2: 21 mmol/L — AB (ref 22–32)
CREATININE: 1.52 mg/dL — AB (ref 0.44–1.00)
GFR calc Af Amer: 37 mL/min — ABNORMAL LOW (ref >60)
GFR calc non Af Amer: 32 mL/min — ABNORMAL LOW (ref >60)
GLUCOSE: 126 mg/dL — AB (ref 65–99)
Potassium: 4.5 mmol/L (ref 3.5–5.1)
Sodium: 143 mmol/L (ref 135–145)

## 2018-03-25 MED ORDER — HYDROXYZINE HCL 10 MG PO TABS
10.0000 mg | ORAL_TABLET | Freq: Three times a day (TID) | ORAL | Status: DC | PRN
Start: 2018-03-25 — End: 2018-03-26
  Administered 2018-03-25: 10 mg via ORAL
  Filled 2018-03-25: qty 1

## 2018-03-25 MED ORDER — CLONIDINE HCL 0.1 MG PO TABS
0.1000 mg | ORAL_TABLET | Freq: Once | ORAL | Status: AC
  Administered 2018-03-25: 0.1 mg via ORAL
  Filled 2018-03-25: qty 1

## 2018-03-25 MED ORDER — IOPAMIDOL (ISOVUE-370) INJECTION 76%
INTRAVENOUS | Status: AC
  Administered 2018-03-25: 50 mL
  Filled 2018-03-25: qty 50

## 2018-03-25 MED ORDER — LORAZEPAM 2 MG/ML IJ SOLN: 2.0000 mg | Freq: Once | INTRAMUSCULAR | Status: DC | PRN

## 2018-03-25 MED ORDER — LACTATED RINGERS IV SOLN
INTRAVENOUS | Status: AC
  Administered 2018-03-25: 13:00:00 via INTRAVENOUS

## 2018-03-25 NOTE — Progress Notes (Signed)
Stroke work up review:  MRI brain showed a punctate focus of restricted diffusion in the right centrum seimovale, which was felt most likely not to be related to the patient's initial presentation.   Echocardiogram showed no mural thrombus.  Carotid ultrasound was a technically limited study. Right Carotid: Unable to visualize ICA secondary to body habitus and breathing interference. Left Carotid: The extracranial vessels were near-normal with only minimal wall thickening or plaque. Vertebrals: Bilateral vertebral arteries demonstrate antegrade flow.  MRA head is still pending.   A/R: 77 year old female presenting with difficulty ambulating, lightheadedness and nausea. MRI showed a punctate acute stroke which was felt most likely not to be related to the patient's initial presentation.  1. Stroke work up almost completed, with MRA head still pending 2. Continue ASA and rosuvastatin.  3. Please call Neurology service if MRA shows findings which may change management.   Electronically signed: Dr. Kerney Elbe

## 2018-03-25 NOTE — Progress Notes (Signed)
PROGRESS NOTE    Amanda [name]  IOE:[id number] DOB: [birth year] DOA: 03/22/2018 PCP: [name], NP    Brief Narrative:  77 y.o. female with medical history significant of DM2, CAD, CKD, HLD, Cirrosis, HTN, Polycyhemia diastolic CHF.  Presented with has been having intermittent lightheadedness and dizziness for past few weeks.  Today she stood up from playing cards and could not move her legs she has been having some numbness and tingling in her toes for history of diabetic neuropathy.  Has not been eating drinking well.  No fevers chills no chest pain or shortness of breath no nausea vomiting has chronic diarrhea. She has not been feeling well for the past 4 days.  She has chronic dyspnea on exertion. She is a former smoker  On further questioning, patient states that 3 months prior to this hospital admission, patient was admitted to hospital in [place] for reported pneumonia.  Patient was essentially bedbound for approximately 8 days.  Patient ultimately left the hospital with what seemed like leaving [place] at home to [facility].  Since that hospital discharge, patient reports being markedly weak in her legs with difficulty ambulating.  Over the past several months, patient essentially was either chair or bed bound with difficulty performing ADLs.  At some point, patient was referred to [place] facility for rehab, however she left 1-1/2 days into her stay secondary to not enjoying her stay there and thus never completed physical therapy.  Patient has otherwise not undergone any other kind of physical therapy afterwards.   Assessment & Plan:   Active Problems:   Hyperlipidemia   [name] ([place])   Essential hypertension   Thrombocytosis (HCC)   Diabetic peripheral neuropathy associated with type 2 diabetes mellitus ([place])   Coronary artery disease involving native coronary artery   Hepatic cirrhosis (HCC)   OSA (obstructive sleep  apnea)   Chronic diarrhea   Type 2 diabetes mellitus with diabetic polyneuropathy ([place])   Dehydration   AKI (acute kidney injury) ([place])   CVA (cerebral vascular accident) ([place])  . Lower extremity weakness  with small infarct seen on MRI -  -Neurology consulted.  Recommendations noted -Infarcts seen on MRI likely not responsible for presenting lower extremity weakness per neurology. -.  Bilateral carotid Dopplers reviewed, no significant stenosis noted -Echocardiogram data reviewed.  Unremarkable study.  Bubble study was difficult to interpret. -Patient remains neurologically intact -Had ordered CT lumbar spine, unremarkable.  Reviewed -Per history, patient was essentially bedbound during a previous hospital stay for over 1 week months ago.  Since then, patient has essentially been either bedbound or chair bound with limited ADLs. -Patient seen by physical therapy, recommendations for skilled nursing facility placement.  -MRA recommended by Neurology however pt was unable to tolerate overnight -Discussed with Neurology who recommends CTA head and neck instead. Ordered, awaiting results  Dyspnea -  -Initial chest x-ray reviewed.  Findings consistent with possible edema. -Patient underwent follow-up CT chest with findings assistant with underlying COPD -Patient is continued on as needed Xopenex, will continue -continued on minimal O2 support  . Dehydration  -improved. Will continue patient on gentle IVF hydration as tolerated  . AKI (acute kidney injury) ([place])  -Patient is continuing IV fluid hydration. -Repeat basic metabolic panel in the morning -Renal function continues to improve  . Chronic diarrhea  -Stable at present -Patient will need to continue follow-up with GI -Improved after giving imodium  . Coronary artery disease involving native coronary  artery  - chronic, continue aspirin  and statin    -Patient remains without chest pain  . Diabetic peripheral neuropathy  associated with type 2 diabetes mellitus (HCC) -Current regimen as tolerated -Continue with SSI coverage  . Essential hypertension  -Continue norvasc -Held Cozaar secondary to concerns of acute renal failure -BP stable at present  . Hepatic cirrhosis (HCC) chronic  -no evidence of thrombocytopenia, albumin stable  -Continue to monitor  . Hyperlipidemia  -Appears to be stable  -Continue with home medications as tolerated   . [name] ([place])  -Patient noted to be on Jakafi 25 mg PO BID will need to be dose adjusted given worsening renal function.   -Evidence of rising white blood cell count patient has been followed by hematology given progressive rise will need to have close follow-up -CBC profile much improved -Repeat CBC in AM  . Thrombocytosis ([place])  -Continue with follow-up of oncology as outpatient -Stable  DVT prophylaxis: scd Code Status: DNR Family Communication: Pt in room, family at bedside Disposition Plan: Uncertain at this time  Consultants:   Neurology  Procedures:     Antimicrobials: Anti-infectives (From admission, onward)   None      Subjective: Reports feeling anxious today, started after attempted MRI overnight which patient did not tolerate secondary to claustrophobia   Objective: Vitals:   03/25/18 0215 03/25/18 0337 03/25/18 0830 03/25/18 1254  BP: (!) 169/74 (!) 146/61 (!) 175/58 (!) 158/81  Pulse: 80 88 81 77  Resp:  16 16 20   Temp:  98.9 F (37.2 C) 98.6 F (37 C) 98.7 F (37.1 C)  TempSrc:   Oral Oral  SpO2:  95% 95% 98%  Weight:      Height:        Intake/Output Summary (Last 24 hours) at 03/25/2018 1714 Last data filed at 03/25/2018 1500 Gross per 24 hour  Intake 752.5 ml  Output -  Net 752.5 ml   Filed Weights   03/22/18 1744 03/23/18 0045  Weight: 97.5 kg (215 lb) 100.4 kg (221 lb 5.5 oz)    Examination: General exam: Awake, laying in bed, in nad Respiratory system: Normal respiratory effort, no  wheezing Cardiovascular system: regular rate, s1, s2 Gastrointestinal system: Soft, nondistended, positive BS Central nervous system: CN2-12 grossly intact, strength intact Extremities: Perfused, no clubbing Skin: Normal skin turgor, no notable skin lesions seen Psychiatry: Mood normal // no visual hallucinations   Data Reviewed: I have personally reviewed following labs and imaging studies  CBC: Recent Labs  Lab 03/22/18 1845 03/22/18 2013 03/24/18 0606 03/25/18 0815  WBC 46.2*  --  19.4* 15.6*  NEUTROABS 40.1*  --   --   --   HGB 14.7 16.0* 13.0 12.3  HCT 46.3* 47.0* 41.2 39.0  MCV 93.0  --  93.4 95.4  PLT 719*  --  364 875   Basic Metabolic Panel: Recent Labs  Lab 03/22/18 2013 03/22/18 2157 03/24/18 0606 03/25/18 0815  NA 139 140 143 143  K 6.5* 4.1 4.5 4.5  CL 106 112* 114* 112*  CO2  --  18* 23 21*  GLUCOSE 39* 71 140* 126*  BUN 64* 40* 32* 27*  CREATININE 2.70* 2.50* 1.73* 1.52*  CALCIUM  --  8.7* 8.5* 8.4*   GFR: Estimated Creatinine Clearance: 34.5 mL/min (A) (by C-G formula based on SCr of 1.52 mg/dL (H)). Liver Function Tests: Recent Labs  Lab 03/22/18 2157  AST 36  ALT 10*  ALKPHOS 125  BILITOT 1.1  PROT  6.3*  ALBUMIN 3.7   No results for input(s): LIPASE, AMYLASE in the last 168 hours. No results for input(s): AMMONIA in the last 168 hours. Coagulation Profile: Recent Labs  Lab 03/23/18 0221  INR 1.36   Cardiac Enzymes: Recent Labs  Lab 03/23/18 0221  TROPONINI 0.04*   BNP (last 3 results) No results for input(s): PROBNP in the last 8760 hours. HbA1C: Recent Labs    03/23/18 0221  HGBA1C 7.9*   CBG: Recent Labs  Lab 03/24/18 2019 03/25/18 0028 03/25/18 0336 03/25/18 0824 03/25/18 1247  GLUCAP 164* 124* 140* 124* 123*   Lipid Profile: Recent Labs    03/23/18 0221  CHOL 167  HDL 25*  LDLCALC 93  TRIG 247*  CHOLHDL 6.7   Thyroid Function Tests: No results for input(s): TSH, T4TOTAL, FREET4, T3FREE, THYROIDAB in  the last 72 hours. Anemia Panel: No results for input(s): VITAMINB12, FOLATE, FERRITIN, TIBC, IRON, RETICCTPCT in the last 72 hours. Sepsis Labs: No results for input(s): PROCALCITON, LATICACIDVEN in the last 168 hours.  Recent Results (from the past 240 hour(s))  MRSA PCR Screening     Status: Abnormal   Collection Time: 03/23/18  5:36 PM  Result Value Ref Range Status   MRSA by PCR POSITIVE (A) NEGATIVE Final    Comment:        The GeneXpert MRSA Assay (FDA approved for NASAL specimens only), is one component of a comprehensive MRSA colonization surveillance program. It is not intended to diagnose MRSA infection nor to guide or monitor treatment for MRSA infections. RESULT CALLED TO, READ BACK BY AND VERIFIED WITHLoletha Grayer [facility] RN [id number] 03/23/18 A BROWNING Performed at [facility], [place] [address] [place], [place] [address]      Radiology Studies: No results found.  Scheduled Meds: .  stroke: mapping our early stages of recovery book   Does not apply Once  . amLODipine  5 mg Oral Daily  . aspirin  300 mg Rectal Daily   Or  . aspirin  325 mg Oral Daily  . gabapentin  100 mg Oral BID  . insulin aspart  0-9 Units Subcutaneous Q4H  . lipase/protease/amylase  36,000 Units Oral TID WC  . rosuvastatin  20 mg Oral q1800  . ruxolitinib phosphate  25 mg Oral BID   Continuous Infusions: . lactated ringers 75 mL/hr at 03/25/18 1500     LOS: 3 days   Marylu Lund, MD [facility] Pager [phone]  If 7PM-7AM, please contact night-coverage [email or web address] Password [email or web address] 03/25/2018, 5:14 PM

## 2018-03-26 DIAGNOSIS — I639 Cerebral infarction, unspecified: Secondary | ICD-10-CM | POA: Diagnosis not present

## 2018-03-26 DIAGNOSIS — R2689 Other abnormalities of gait and mobility: Secondary | ICD-10-CM | POA: Diagnosis not present

## 2018-03-26 DIAGNOSIS — I2510 Atherosclerotic heart disease of native coronary artery without angina pectoris: Secondary | ICD-10-CM | POA: Diagnosis not present

## 2018-03-26 DIAGNOSIS — I10 Essential (primary) hypertension: Secondary | ICD-10-CM | POA: Diagnosis not present

## 2018-03-26 DIAGNOSIS — R41841 Cognitive communication deficit: Secondary | ICD-10-CM | POA: Diagnosis not present

## 2018-03-26 DIAGNOSIS — K529 Noninfective gastroenteritis and colitis, unspecified: Secondary | ICD-10-CM | POA: Diagnosis not present

## 2018-03-26 DIAGNOSIS — I519 Heart disease, unspecified: Secondary | ICD-10-CM | POA: Diagnosis not present

## 2018-03-26 DIAGNOSIS — N179 Acute kidney failure, unspecified: Secondary | ICD-10-CM | POA: Diagnosis not present

## 2018-03-26 DIAGNOSIS — E1142 Type 2 diabetes mellitus with diabetic polyneuropathy: Secondary | ICD-10-CM | POA: Diagnosis not present

## 2018-03-26 DIAGNOSIS — E860 Dehydration: Secondary | ICD-10-CM | POA: Diagnosis not present

## 2018-03-26 DIAGNOSIS — M6281 Muscle weakness (generalized): Secondary | ICD-10-CM | POA: Diagnosis not present

## 2018-03-26 LAB — COMPREHENSIVE METABOLIC PANEL
ALT: 11 U/L — ABNORMAL LOW (ref 14–54)
AST: 25 U/L (ref 15–41)
Albumin: 3 g/dL — ABNORMAL LOW (ref 3.5–5.0)
Alkaline Phosphatase: 92 U/L (ref 38–126)
Anion gap: 9 (ref 5–15)
BILIRUBIN TOTAL: 1.2 mg/dL (ref 0.3–1.2)
BUN: 26 mg/dL — AB (ref 6–20)
CHLORIDE: 112 mmol/L — AB (ref 101–111)
CO2: 24 mmol/L (ref 22–32)
Calcium: 8.4 mg/dL — ABNORMAL LOW (ref 8.9–10.3)
Creatinine, Ser: 1.46 mg/dL — ABNORMAL HIGH (ref 0.44–1.00)
GFR calc Af Amer: 39 mL/min — ABNORMAL LOW (ref >60)
GFR calc non Af Amer: 33 mL/min — ABNORMAL LOW (ref >60)
GLUCOSE: 114 mg/dL — AB (ref 65–99)
POTASSIUM: 4.7 mmol/L (ref 3.5–5.1)
SODIUM: 145 mmol/L (ref 135–145)
TOTAL PROTEIN: 5.7 g/dL — AB (ref 6.5–8.1)

## 2018-03-26 LAB — CBC
HEMATOCRIT: 38.5 % (ref 36.0–46.0)
Hemoglobin: 12.3 g/dL (ref 12.0–15.0)
MCH: 29.9 pg (ref 26.0–34.0)
MCHC: 31.9 g/dL (ref 30.0–36.0)
MCV: 93.4 fL (ref 78.0–100.0)
Platelets: 404 10*3/uL — ABNORMAL HIGH (ref 150–400)
RBC: 4.12 MIL/uL (ref 3.87–5.11)
RDW: 16.3 % — AB (ref 11.5–15.5)
WBC: 16.9 10*3/uL — ABNORMAL HIGH (ref 4.0–10.5)

## 2018-03-26 LAB — GLUCOSE, CAPILLARY
GLUCOSE-CAPILLARY: 160 mg/dL — AB (ref 65–99)
Glucose-Capillary: 107 mg/dL — ABNORMAL HIGH (ref 65–99)
Glucose-Capillary: 118 mg/dL — ABNORMAL HIGH (ref 65–99)

## 2018-03-26 MED ORDER — ASPIRIN 325 MG PO TABS: 325.0000 mg | ORAL_TABLET | Freq: Every day | ORAL | 0 refills | Status: DC

## 2018-03-26 MED ORDER — AMLODIPINE BESYLATE 10 MG PO TABS: 10.0000 mg | ORAL_TABLET | Freq: Every day | ORAL | Status: DC

## 2018-03-26 MED ORDER — GABAPENTIN 100 MG PO CAPS: 100.0000 mg | ORAL_CAPSULE | Freq: Two times a day (BID) | ORAL | 0 refills | Status: DC

## 2018-03-26 MED ORDER — CLOPIDOGREL BISULFATE 75 MG PO TABS: 75.0000 mg | ORAL_TABLET | Freq: Every day | ORAL | 0 refills | Status: DC

## 2018-03-26 MED ORDER — AMLODIPINE BESYLATE 10 MG PO TABS: 10.0000 mg | ORAL_TABLET | Freq: Every day | ORAL | 0 refills | Status: DC

## 2018-03-26 MED ORDER — HYDROXYZINE HCL 10 MG PO TABS: 10.0000 mg | ORAL_TABLET | Freq: Three times a day (TID) | ORAL | 0 refills | Status: DC | PRN

## 2018-03-26 MED ORDER — INSULIN ASPART 100 UNIT/ML ~~LOC~~ SOLN: 0.0000 [IU] | SUBCUTANEOUS | 0 refills | Status: DC

## 2018-03-26 NOTE — Discharge Summary (Signed)
Physician Discharge Summary  WANIYA HOGLUND WUJ:[id number] DOB: 03-27-1941 DOA: 03/22/2018  PCP: [name], NP  Admit date: 03/22/2018 Discharge date: 03/26/2018  Admitted From: Home Disposition:  SNF  Recommendations for Outpatient Follow-up:  1. Follow up with PCP in 1-2 weeks 2. Please recheck BMP/CBC in one week 3. Please note home U500 at 100 units BID was d/c at discharge as patient's glucose was controlled on only sliding scale. Please monitor glucose closely. Thanks 4. Strongly recommend diabetic diet education. Patient admits to noncompliance with diabetic diet  Discharge Condition:Stable CODE STATUS:DNR Diet recommendation: Diabetic   Brief/Interim Summary: 77 y.o.femalewith medical history significant of DM2, CAD, CKD, HLD, Cirrosis, HTN, Polycyhemiadiastolic CHF.  Presented withhas been having intermittent lightheadedness and dizziness for past few weeks. Today she stood up from playing cards and could not move her legs she has been having some numbness and tingling in her toes for history of diabetic neuropathy. Has not been eating drinking well. No fevers chills no chest pain or shortness of breath no nausea vomiting has chronic diarrhea. She has not been feeling well for the past 4 days.  She has chronic dyspnea on exertion. She is a former smoker  On further questioning, patient states that 3 months prior to this hospital admission, patient was admitted to hospital in [place] for reported pneumonia.  Patient was essentially bedbound for approximately 8 days.  Patient ultimately left the hospital with what seemed like leaving [place] at home to [facility].  Since that hospital discharge, patient reports being markedly weak in her legs with difficulty ambulating.  Over the past several months, patient essentially was either chair or bed bound with difficulty performing ADLs.  At some point, patient was referred to [facility]  facility for rehab, however she left 1-1/2 days into her stay secondary to not enjoying her stay there and thus never completed physical therapy.  Patient has otherwise not undergone any other kind of physical therapy afterwards.  . Lower extremity weakness with small infarct seen on MRI-  -Neurology consulted -Infarcts seen on MRI likely not responsible for presenting lower extremity weakness per neurology. -.  Bilateral carotid Dopplers reviewed, no significant stenosis noted -Echocardiogram data reviewed.  Unremarkable study.  Bubble study was difficult to interpret. -Patient remains neurologically intact -Had ordered CT lumbar spine, unremarkable.  Reviewed -Per history, patient was essentially bedbound during a previous hospital stay for over 1 week months ago.  Since then, patient has essentially been either bedbound or chair bound with limited ADLs. -Patient seen by physical therapy, recommendations for skilled nursing facility placement.  -MRA recommended by Neurology however pt was unable to tolerate -Discussed with Neurology who recommended CTA head and neck instead. Patient with findings of microvascular stenosis noted. Continued on ASA and crestor per Neurology  Dyspnea -  -Initial chest x-ray reviewed.  Findings consistent with possible edema. -Patient underwent follow-up CT chest with findings assistant with underlying COPD, otherwise unremarkable -Patient was continued on as needed Xopenex, will continue -continued on minimal O2 support  . Dehydration -improved. Continued patient on gentle IVF hydration as tolerated  . AKI (acute kidney injury) ([place]) -Patient is continuing IV fluid hydration. -Renal function improved. Recommend repeat BMET in 1 week  . Chronic diarrhea -Stable at present -Patient will need to continue follow-up with GI -Improved after giving imodium  . Coronary artery disease involving native coronary artery - chronic, continue aspirin and  statin  -Patient remains without chest pain  .  Diabetic peripheral neuropathy associated with type 2 diabetes mellitus (HCC) -Current regimen as tolerated -Glucose remained stable on SSI coverage alone -Pt on U500 insulin at 100 units TID prior to admit. On further questioning, patient admits to gross noncompliance with diabetic diet (admits to eating muffins smeared with butter, ice cream and brownies at bedtime, etc.) -Will discharge on sliding scale insulin alone. -STRONGLY recommend education on proper diabetic diet  . Essential hypertension -Continue norvasc -Held Cozaar secondary to concerns of acute renal failure -BP stable at present  . Hepatic cirrhosis (HCC)chronic  -no evidence of thrombocytopenia, albumin stable  -Continue to monitor  . Hyperlipidemia -Appears to be stable  -Continue with home medications as tolerated  . [name] ([place]) -Patient noted to be onJakafi 25 mg PO BIDwill need to be dose adjusted given worsening renal function.  -Evidence of rising white blood cell count patient has been followed by hematology given progressive rise will need to have close follow-up -CBC profile much improved -Recommend repeat cbc in 1 week  . Thrombocytosis ([place]) -Continue with follow-up of oncology as outpatient -Stable  Discharge Diagnoses:  Active Problems:   Hyperlipidemia   [name] ([place])   Essential hypertension   Thrombocytosis (HCC)   Diabetic peripheral neuropathy associated with type 2 diabetes mellitus ([place])   Coronary artery disease involving native coronary artery   Hepatic cirrhosis (HCC)   OSA (obstructive sleep apnea)   Chronic diarrhea   Type 2 diabetes mellitus with diabetic polyneuropathy (HCC)   Dehydration   AKI (acute kidney injury) ([place])   CVA (cerebral vascular accident) [facility])    Discharge Instructions   Allergies as of 03/26/2018      Reactions   Lisinopril Cough   Tape Hives   Doxycycline Hives,  Swelling, Rash   Latex Hives, Itching, Rash      Medication List    STOP taking these medications   Insulin Pen Needle 32G X 4 MM Misc   insulin regular human CONCENTRATED 500 UNIT/ML kwikpen Commonly known as:  HUMULIN R U-500 KWIKPEN   LORazepam 0.5 MG tablet Commonly known as:  ATIVAN   losartan 100 MG tablet Commonly known as:  COZAAR   meloxicam 7.5 MG tablet Commonly known as:  MOBIC   nitroGLYCERIN 0.4 MG SL tablet Commonly known as:  NITROSTAT   oxyCODONE-acetaminophen 5-325 MG tablet Commonly known as:  PERCOCET/ROXICET     TAKE these medications   ACCU-CHEK GUIDE w/Device Kit 1 kit by Does not apply route as directed.   amLODipine 10 MG tablet Commonly known as:  NORVASC Take 1 tablet (10 mg total) by mouth daily. Start taking on:  03/27/2018 What changed:    medication strength  how much to take   aspirin 325 MG tablet Take 1 tablet (325 mg total) by mouth daily. Start taking on:  03/27/2018   clopidogrel 75 MG tablet Commonly known as:  PLAVIX Take 1 tablet (75 mg total) by mouth daily.   diphenoxylate-atropine 2.5-0.025 MG tablet Commonly known as:  LOMOTIL Take 1-2 tablets by mouth 2 (two) times daily as needed for diarrhea or loose stools.   gabapentin 100 MG capsule Commonly known as:  NEURONTIN Take 1 capsule (100 mg total) by mouth 2 (two) times daily.   glucose blood test strip Commonly known as:  ACCU-CHEK GUIDE Use to check blood sugars 3 times daily   hydrOXYzine 10 MG tablet Commonly known as:  ATARAX/VISTARIL Take 1 tablet (10 mg total) by mouth 3 (three) times daily  as needed for anxiety.   insulin aspart 100 UNIT/ML injection Commonly known as:  novoLOG Inject 0-9 Units into the skin every 4 (four) hours. CBG < 70: implement hypoglycemia protocol  CBG 70 - 120: 0 units  CBG 121 - 150: 1 unit  CBG 151 - 200: 2 units  CBG 201 - 250: 3 units  CBG 251 - 300: 5 units  CBG 301 - 350: 7 units  CBG 351 - 400 9 units  CBG >  400 call MD   lipase/protease/amylase 36000 UNITS Cpep capsule Commonly known as:  CREON Take 1 capsule (36,000 Units total) by mouth 3 (three) times daily with meals.   loperamide 2 MG tablet Commonly known as:  IMODIUM A-D Take 1-2 tablets (2-4 mg total) by mouth daily as needed for diarrhea or loose stools.   ondansetron 4 MG disintegrating tablet Commonly known as:  ZOFRAN ODT Take 1 tablet (4 mg total) by mouth every 8 (eight) hours as needed. What changed:  reasons to take this   promethazine 25 MG tablet Commonly known as:  PHENERGAN Take 0.5 tablets (12.5 mg total) by mouth every 6 (six) hours as needed for nausea or vomiting.   rosuvastatin 20 MG tablet Commonly known as:  CRESTOR TAKE 1 TABLET(20 MG) BY MOUTH DAILY   ruxolitinib phosphate 25 MG tablet Commonly known as:  JAKAFI Take 1 tablet (25 mg total) by mouth 2 (two) times daily.   triamcinolone ointment 0.5 % Commonly known as:  KENALOG Apply 1 application topically 2 (two) times daily.      Follow-up Information    [name], NP. Schedule an appointment as soon as possible for a visit in 1 week(s).   Specialty:  Family Medicine Contact information: [address] [place] [address] [phone]        Eliezer Bottom, NP. Schedule an appointment as soon as possible for a visit.   Specialty:  Nurse Practitioner Contact information: [place] [address] [phone]          Allergies  Allergen Reactions  . Lisinopril Cough  . Tape Hives  . Doxycycline Hives, Swelling and Rash  . Latex Hives, Itching and Rash    Consultations:  Neurology  Procedures/Studies: Ct Angio Head W Or Wo Contrast  Result Date: 03/25/2018 CLINICAL DATA:  Punctate white matter infarct of the right corona radiata. Stroke follow-up. EXAM: CT ANGIOGRAPHY HEAD AND NECK TECHNIQUE: Multidetector CT imaging of the head and neck was performed using the standard  protocol during bolus administration of intravenous contrast. Multiplanar CT image reconstructions and MIPs were obtained to evaluate the vascular anatomy. Carotid stenosis measurements (when applicable) are obtained utilizing NASCET criteria, using the distal internal carotid diameter as the denominator. CONTRAST:  95m ISOVUE-370 IOPAMIDOL (ISOVUE-370) INJECTION 76% COMPARISON:  MRI brain 03/22/2018. FINDINGS: CT HEAD FINDINGS Brain: Noncontrast imaging demonstrates mild diffuse white matter disease. The punctate white matter infarct is not visualized by CT. No new or acute cortical infarcts are present. The basal ganglia are intact. A remote lacunar infarct of the right cerebellum is again noted. Vascular: Atherosclerotic calcifications are present within the cavernous internal carotid arteries bilaterally. There is no hyperdense vessel. Skull: Calvarium is intact. No focal lytic or blastic lesions are present. 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 Sinuses: The paranasal sinuses and mastoid air cells are clear. Orbits: Bilateral lens replacements are present Globes and orbits are otherwise within normal limits. Review of the MIP images  confirms the above findings CTA NECK FINDINGS Aortic arch: A 3 vessel arch configuration is present. Atherosclerotic calcifications are present at the aorta without focal stenosis or aneurysm. Right carotid system: There is medial deviation of the right common carotid artery. Atherosclerotic calcifications are present at the bifurcation without significant focal stenosis. There is tortuosity of the cervical right ICA without significant stenosis. Left carotid system: The left common carotid artery is within normal limits. Atherosclerotic calcifications are present at the left carotid bifurcation. There is no significant luminal stenosis relative to the more distal vessel. The cervical left ICA is otherwise normal. Vertebral arteries: The right vertebral artery is the  dominant vessel. Both vertebral arteries originate from the subclavian arteries without focal stenosis. There is no significant stenosis of either vertebral artery in the neck. Skeleton: There is straightening of the normal cervical lordosis. Mild endplate changes are most evident at C5-6. No focal lytic or blastic lesions are present. The patient is edentulous. Other neck: The soft tissues the neck are otherwise unremarkable. No focal mucosal or submucosal lesion is present. The salivary glands are within normal limits. No significant adenopathy is present. Upper chest: Lung apices are clear. Thoracic inlet is within normal limits. Review of the MIP images confirms the above findings CTA HEAD FINDINGS Anterior circulation: Atherosclerotic calcifications are present within the cavernous internal carotid arteries bilaterally. There is moderate to severe stenosis of the left ophthalmic segment. There is mild narrowing of the proximal left M1 segment and high-grade stenosis of the proximal left A1 segment. Right A1 and M1 segments are normal. The anterior communicating artery is patent. There is asymmetric attenuation MCA branch vessels on the right. Superior division stenosis ease are more severe. ACA branch vessels are within normal limits. Posterior circulation: The right vertebral artery is the dominant vessel. The PICA origins are visualized and normal bilaterally. The basilar artery is normal. Both posterior cerebral arteries originate from the basilar tip. There is a high-grade stenosis of the proximal left P1 segment. There is asymmetric attenuation of PCA branch vessels on the left. Venous sinuses: The dural sinuses are patent. Straight sinus and deep cerebral veins are intact. Cortical veins are unremarkable. Anatomic variants: None Delayed phase: Postcontrast images accentuate the white matter disease. No pathologic enhancement is present. Review of the MIP images confirms the above findings IMPRESSION: 1.  Moderate to severe stenosis of the left ophthalmic segment relative to the more distal left ICA terminus. 2. High-grade stenosis of the proximal left A1 segment with patent anterior communicating artery and normal more distal ACA branch vessels. 3. Mild narrowing of the proximal left M1 segment. 4. Asymmetric attenuation of left MCA branch vessels compared to the right. 5. 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 6. Atherosclerotic changes involving the aortic arch and bilateral carotid bifurcations without significant stenosis in the neck. Electronically Signed   By: San Morelle M.D.   On: 03/25/2018 18:48   Dg Chest 2 View  Result Date: 03/23/2018 CLINICAL DATA:  Acute onset of dyspnea. EXAM: CHEST - 2 VIEW COMPARISON:  Chest radiograph performed 01/10/2018, and CTA of the chest performed 01/28/2016 FINDINGS: The lungs are well-aerated. Vascular congestion is noted. Mildly increased interstitial markings may reflect mild interstitial edema. No significant interstitial lung disease was seen on prior CTs. There is no evidence of pleural effusion or pneumothorax. The heart is borderline normal in size. No acute osseous abnormalities are seen. IMPRESSION: Vascular congestion noted. Mildly increased interstitial markings may reflect mild interstitial edema. Electronically  Signed   By: Garald Balding M.D.   On: 03/23/2018 00:25   Ct Angio Neck W Or Wo Contrast  Result Date: 03/25/2018 CLINICAL DATA:  Punctate white matter infarct of the right corona radiata. Stroke follow-up. EXAM: CT ANGIOGRAPHY HEAD AND NECK TECHNIQUE: Multidetector CT imaging of the head and neck was performed using the standard protocol during bolus administration of intravenous contrast. Multiplanar CT image reconstructions and MIPs were obtained to evaluate the vascular anatomy. Carotid stenosis measurements (when applicable) are obtained utilizing NASCET criteria, using the distal internal carotid diameter as the  denominator. CONTRAST:  43m ISOVUE-370 IOPAMIDOL (ISOVUE-370) INJECTION 76% COMPARISON:  MRI brain 03/22/2018. FINDINGS: CT HEAD FINDINGS Brain: Noncontrast imaging demonstrates mild diffuse white matter disease. The punctate white matter infarct is not visualized by CT. No new or acute cortical infarcts are present. The basal ganglia are intact. A remote lacunar infarct of the right cerebellum is again noted. Vascular: Atherosclerotic calcifications are present within the cavernous internal carotid arteries bilaterally. There is no hyperdense vessel. Skull: Calvarium is intact. No focal lytic or blastic lesions are present. 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 Sinuses: The paranasal sinuses and mastoid air cells are clear. Orbits: Bilateral lens replacements are present Globes and orbits are otherwise within normal limits. Review of the MIP images confirms the above findings CTA NECK FINDINGS Aortic arch: A 3 vessel arch configuration is present. Atherosclerotic calcifications are present at the aorta without focal stenosis or aneurysm. Right carotid system: There is medial deviation of the right common carotid artery. Atherosclerotic calcifications are present at the bifurcation without significant focal stenosis. There is tortuosity of the cervical right ICA without significant stenosis. Left carotid system: The left common carotid artery is within normal limits. Atherosclerotic calcifications are present at the left carotid bifurcation. There is no significant luminal stenosis relative to the more distal vessel. The cervical left ICA is otherwise normal. Vertebral arteries: The right vertebral artery is the dominant vessel. Both vertebral arteries originate from the subclavian arteries without focal stenosis. There is no significant stenosis of either vertebral artery in the neck. Skeleton: There is straightening of the normal cervical lordosis. Mild endplate changes are most evident at C5-6. No  focal lytic or blastic lesions are present. The patient is edentulous. Other neck: The soft tissues the neck are otherwise unremarkable. No focal mucosal or submucosal lesion is present. The salivary glands are within normal limits. No significant adenopathy is present. Upper chest: Lung apices are clear. Thoracic inlet is within normal limits. Review of the MIP images confirms the above findings CTA HEAD FINDINGS Anterior circulation: Atherosclerotic calcifications are present within the cavernous internal carotid arteries bilaterally. There is moderate to severe stenosis of the left ophthalmic segment. There is mild narrowing of the proximal left M1 segment and high-grade stenosis of the proximal left A1 segment. Right A1 and M1 segments are normal. The anterior communicating artery is patent. There is asymmetric attenuation MCA branch vessels on the right. Superior division stenosis ease are more severe. ACA branch vessels are within normal limits. Posterior circulation: The right vertebral artery is the dominant vessel. The PICA origins are visualized and normal bilaterally. The basilar artery is normal. Both posterior cerebral arteries originate from the basilar tip. There is a high-grade stenosis of the proximal left P1 segment. There is asymmetric attenuation of PCA branch vessels on the left. Venous sinuses: The dural sinuses are patent. Straight sinus and deep cerebral veins are intact. Cortical veins are unremarkable. Anatomic variants: None  Delayed phase: Postcontrast images accentuate the white matter disease. No pathologic enhancement is present. Review of the MIP images confirms the above findings IMPRESSION: 1. Moderate to severe stenosis of the left ophthalmic segment relative to the more distal left ICA terminus. 2. High-grade stenosis of the proximal left A1 segment with patent anterior communicating artery and normal more distal ACA branch vessels. 3. Mild narrowing of the proximal left M1  segment. 4. Asymmetric attenuation of left MCA branch vessels compared to the right. 5. 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 6. Atherosclerotic changes involving the aortic arch and bilateral carotid bifurcations without significant stenosis in the neck. Electronically Signed   By: San Morelle M.D.   On: 03/25/2018 18:48   Ct Chest Wo Contrast  Result Date: 03/23/2018 CLINICAL DATA:  Chronic dyspnea.  Smoker. EXAM: CT CHEST WITHOUT CONTRAST TECHNIQUE: Multidetector CT imaging of the chest was performed following the standard protocol without IV contrast. COMPARISON:  Chest radiographs obtained earlier today. Chest CTA dated 01/28/2016. Abdomen and pelvis CT dated 01/04/2018. FINDINGS: Cardiovascular: Atheromatous calcifications, including the coronary arteries and aorta. The ascending thoracic aorta continues to measure 4.1 cm in maximum diameter. Mediastinum/Nodes: No enlarged mediastinal or axillary lymph nodes. Thyroid gland, trachea, and esophagus demonstrate no significant findings. Lungs/Pleura: Minimal bilateral bullous changes. The previously demonstrated 7 x 6 mm superior segment right lower lobe nodule measures 6 x 5 mm on image number 74 series 4. No new lung nodules. No pleural fluid. Upper Abdomen: Changes of cirrhosis of the liver with irregular contours enlarged lateral segment left lobe and caudate lobe with a relatively small right lobe. The spleen is enlarged. Small to moderate amount of free peritoneal fluid. Musculoskeletal: Thoracic and lower cervical spine degenerative changes. IMPRESSION: 1. 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 Recommend annual imaging followup by CTA or MRA. This recommendation follows 2010 ACCF/AHA/AATS/ACR/ASA/SCA/SCAI/SIR/STS/SVM Guidelines for the Diagnosis and Management of Patients with Thoracic Aortic Disease. Circulation. 2010; 121: Y782-N562 2. Stable to slightly  decreased size of the previously demonstrated right lower lobe pulmonary nodule. This is compatible with a benign nodule. 3.  Calcific coronary artery and aortic atherosclerosis. 4. Mild changes of COPD with centrilobular and paraseptal emphysema. Aortic Atherosclerosis (ICD10-I70.0) and Emphysema (ICD10-J43.9). Electronically Signed   By: Claudie Revering M.D.   On: 03/23/2018 13:51   Ct Lumbar Spine Wo Contrast  Addendum Date: 03/24/2018   ADDENDUM REPORT: 03/24/2018 13:35 ADDENDUM: Additional reformats of the lumbar spine submitted affirming prior report. Electronically Signed   By: Elon Alas M.D.   On: 03/24/2018 13:35   Result Date: 03/24/2018 CLINICAL DATA:  Dyspnea, radiculopathy for greater than 6 weeks. History of cirrhosis. EXAM: CT LUMBAR SPINE WITHOUT CONTRAST TECHNIQUE: Multidetector CT imaging of the lumbar spine was performed without intravenous contrast administration. Multiplanar CT image reconstructions were also generated. COMPARISON:  CT under pelvis January 04, 2018 FINDINGS: SEGMENTATION: For the purposes of this report the last well-formed intervertebral disc space is reported as L5-S1. ALIGNMENT: Maintained lumbar lordosis. No malalignment. VERTEBRAE: Vertebral bodies and posterior elements are intact (L1 incompletely imaged). Intervertebral disc heights preserved, mild endplate spurring. No destructive bony lesions. L4 stable hemangioma. PARASPINAL AND OTHER SOFT TISSUES: Splenomegaly partially imaged. Partially imaged ascites. Moderate calcific atherosclerosis aortoiliac vessels. DISC LEVELS: L1-2: No disc bulge, canal stenosis nor neural foraminal narrowing. L2-3: Mild endplate spurring, mild to moderate right greater than left facet arthropathy. No canal stenosis. Mild to moderate right neural  foraminal narrowing. L3-4: Small broad-based disc osteophyte complex. Mild-to-moderate facet arthropathy and ligamentum flavum redundancy. No canal stenosis. Mild to moderate bilateral neural  foraminal narrowing. L4-5: Moderate broad-based disc osteophyte complex. Moderate facet arthropathy and ligamentum flavum redundancy. Mild canal stenosis. Mild right, moderate left neural foraminal narrowing. L5-S1: Moderate broad-based disc osteophyte complex. Severe facet arthropathy. No canal stenosis. Moderate right, severe left neural foraminal narrowing. IMPRESSION: 1. No fracture or malalignment. 2. Degenerative change of the lumbar spine. Mild canal stenosis L4-5. 3. Normal foraminal narrowing L2-3 through L5-S1: Severe on the left at L5-S1. Aortic Atherosclerosis (ICD10-I70.0). Electronically Signed: By: Elon Alas M.D. On: 03/23/2018 15:59   Mr Brain Wo Contrast  Result Date: 03/22/2018 CLINICAL DATA:  Initial evaluation for generalized weakness, vertigo. EXAM: MRI HEAD WITHOUT CONTRAST TECHNIQUE: Multiplanar, multiecho pulse sequences of the brain and surrounding structures were obtained without intravenous contrast. COMPARISON:  Prior MRI from 01/29/2014. FINDINGS: Brain: Moderately advanced cerebral atrophy. Patchy T2/FLAIR hyperintensity within the periventricular and deep white matter both cerebral hemispheres, most consistent with chronic small vessel ischemic change. Superimposed scatter remote lacunar infarcts present within the bilateral cerebral hemispheric white matter. Small remote right cerebellar infarct noted as well. Punctate focus of diffusion abnormality within the deep white matter of the right centrum semi ovale consistent with acute ischemic small vessel type infarct (series 4, image 34). No associated hemorrhage. No other evidence for acute or subacute ischemia. Gray-white matter differentiation otherwise maintained. No evidence for acute or chronic intracranial hemorrhage. No mass lesion, midline shift, or mass effect. Ventriculomegaly with asymmetric dilatation of the left lateral ventricle stable from previous. No hydrocephalus. No extra-axial fluid collection. Normal  pituitary gland. Vascular: Major intracranial vascular flow voids maintained. Skull and upper cervical spine: Upper cervical spine normal. Craniocervical junction normal. Heterogeneous marrow signal intensity again noted. No discrete osseous lesion. No scalp soft tissue abnormality. Sinuses/Orbits: Globes and orbital soft tissues within normal limits. Patient status post lens extraction bilaterally. Paranasal sinuses are clear. Right mastoid effusion with partial opacification of the right middle ear cavity, similar to previous, likely chronic. Visualized nasopharynx clear. Other: None. IMPRESSION: 1. Punctate small vessel type acute ischemic nonhemorrhagic infarct involving the posterior right centrum semi ovale. 2. Moderately advanced cerebral atrophy with mild to moderate chronic small vessel ischemic disease. 3. Chronic right mastoid effusion. Electronically Signed   By: Jeannine Boga M.D.   On: 03/22/2018 21:33    Subjective: Eager to be discharged  Discharge Exam: Vitals:   03/26/18 0827 03/26/18 1220  BP: (!) 147/118 (!) 176/80  Pulse: 76 82  Resp: 18 20  Temp: 98.2 F (36.8 C) 98.4 F (36.9 C)  SpO2: 97% 94%   Vitals:   03/25/18 2349 03/26/18 0423 03/26/18 0827 03/26/18 1220  BP: (!) 164/67 (!) 155/59 (!) 147/118 (!) 176/80  Pulse: 76 70 76 82  Resp:  16 18 20   Temp: 98.2 F (36.8 C) 98.1 F (36.7 C) 98.2 F (36.8 C) 98.4 F (36.9 C)  TempSrc: Oral Oral Oral Oral  SpO2: 90% 90% 97% 94%  Weight:      Height:        General: Pt is alert, awake, not in acute distress Cardiovascular: RRR, S1/S2 +, no rubs, no gallops Respiratory: CTA bilaterally, no wheezing, no rhonchi Abdominal: Soft, NT, ND, bowel sounds + Extremities: no edema, no cyanosis   The results of significant diagnostics from this hospitalization (including imaging, microbiology, ancillary and laboratory) are listed below for reference.     Microbiology:  Recent Results (from the past 240 hour(s))   MRSA PCR Screening     Status: Abnormal   Collection Time: 03/23/18  5:36 PM  Result Value Ref Range Status   MRSA by PCR POSITIVE (A) NEGATIVE Final    Comment:        The GeneXpert MRSA Assay (FDA approved for NASAL specimens only), is one component of a comprehensive MRSA colonization surveillance program. It is not intended to diagnose MRSA infection nor to guide or monitor treatment for MRSA infections. RESULT CALLED TO, READ BACK BY AND VERIFIED WITHLoletha Grayer [facility] RN 2044 03/23/18 A BROWNING Performed at [facility], [place] [address] [place], [place] [address]      Labs: BNP (last 3 results) Recent Labs    03/23/18 0221  BNP 952.8*   Basic Metabolic Panel: Recent Labs  Lab 03/22/18 2013 03/22/18 2157 03/24/18 0606 03/25/18 0815 03/26/18 0514  NA 139 140 143 143 145  K 6.5* 4.1 4.5 4.5 4.7  CL 106 112* 114* 112* 112*  CO2  --  18* 23 21* 24  GLUCOSE 39* 71 140* 126* 114*  BUN 64* 40* 32* 27* 26*  CREATININE 2.70* 2.50* 1.73* 1.52* 1.46*  CALCIUM  --  8.7* 8.5* 8.4* 8.4*   Liver Function Tests: Recent Labs  Lab 03/22/18 2157 03/26/18 0514  AST 36 25  ALT 10* 11*  ALKPHOS 125 92  BILITOT 1.1 1.2  PROT 6.3* 5.7*  ALBUMIN 3.7 3.0*   No results for input(s): LIPASE, AMYLASE in the last 168 hours. No results for input(s): AMMONIA in the last 168 hours. CBC: Recent Labs  Lab 03/22/18 1845 03/22/18 2013 03/24/18 0606 03/25/18 0815 03/26/18 0514  WBC 46.2*  --  19.4* 15.6* 16.9*  NEUTROABS 40.1*  --   --   --   --   HGB 14.7 16.0* 13.0 12.3 12.3  HCT 46.3* 47.0* 41.2 39.0 38.5  MCV 93.0  --  93.4 95.4 93.4  PLT 719*  --  364 330 404*   Cardiac Enzymes: Recent Labs  Lab 03/23/18 0221  TROPONINI 0.04*   BNP: Invalid input(s): POCBNP CBG: Recent Labs  Lab 03/25/18 2028 03/25/18 2349 03/26/18 0424 03/26/18 0823 03/26/18 1219  GLUCAP 162* 107* 107* 118* 160*   D-Dimer No results for input(s): DDIMER in the last 72 hours. Hgb  A1c No results for input(s): HGBA1C in the last 72 hours. Lipid Profile No results for input(s): CHOL, HDL, LDLCALC, TRIG, CHOLHDL, LDLDIRECT in the last 72 hours. Thyroid function studies No results for input(s): TSH, T4TOTAL, T3FREE, THYROIDAB in the last 72 hours.  Invalid input(s): FREET3 Anemia work up No results for input(s): VITAMINB12, FOLATE, FERRITIN, TIBC, IRON, RETICCTPCT in the last 72 hours. Urinalysis    Component Value Date/Time   COLORURINE YELLOW 03/22/2018 2331   APPEARANCEUR HAZY (A) 03/22/2018 2331   LABSPEC 1.010 03/22/2018 2331   PHURINE 5.0 03/22/2018 2331   GLUCOSEU NEGATIVE 03/22/2018 2331   GLUCOSEU NEGATIVE 01/10/2018 1217   HGBUR NEGATIVE 03/22/2018 2331   HGBUR large 05/07/2010 0845   BILIRUBINUR NEGATIVE 03/22/2018 2331   BILIRUBINUR neg 01/26/2016 1003   KETONESUR NEGATIVE 03/22/2018 2331   PROTEINUR 100 (A) 03/22/2018 2331   UROBILINOGEN 0.2 01/10/2018 1217   NITRITE NEGATIVE 03/22/2018 2331   LEUKOCYTESUR NEGATIVE 03/22/2018 2331   Sepsis Labs Invalid input(s): PROCALCITONIN,  WBC,  LACTICIDVEN Microbiology Recent Results (from the past 240 hour(s))  MRSA PCR Screening     Status: Abnormal  Collection Time: 03/23/18  5:36 PM  Result Value Ref Range Status   MRSA by PCR POSITIVE (A) NEGATIVE Final    Comment:        The GeneXpert MRSA Assay (FDA approved for NASAL specimens only), is one component of a comprehensive MRSA colonization surveillance program. It is not intended to diagnose MRSA infection nor to guide or monitor treatment for MRSA infections. RESULT CALLED TO, READ BACK BY AND VERIFIED WITHLoletha Grayer [facility] RN 2044 03/23/18 A BROWNING Performed at [facility], [place] [address], [place], [place] [address]    Time spent: 3mn  SIGNED:   SMarylu Lund MD  [facility] 03/26/2018, 1:01 PM  If 7PM-7AM, please contact night-coverage [email or web address] Password [email or web address]

## 2018-03-26 NOTE — Progress Notes (Signed)
Pt given discharge instructions, prescriptions, and care notes. Pt verbalized understanding AEB no further questions or concerns at this time. IV was discontinued, no redness, pain, or swelling noted at this time. Telemetry discontinued and Centralized Telemetry was notified. Pt left the floor via wheelchair with staff in stable condition.  Called for report.  No answer will try again in 10 minutes.

## 2018-03-26 NOTE — Progress Notes (Signed)
CSW trying to get insurance auth for disposition planning.  CSW looking into other options for disposition.  Reed Breech LCSWA [phone]

## 2018-03-26 NOTE — Progress Notes (Signed)
Called for Report to [facility].  No answer.

## 2018-03-26 NOTE — Progress Notes (Signed)
Called for report a 2nd time.  Still no answer.

## 2018-03-26 NOTE — Progress Notes (Signed)
Patient will Discharge To: [facility] Date:03/26/18 Family Notified:[name] Kiehn at bedside [phone]) Transport By: Regis Bill   Per MD patient ready for DC to [facility]. RN, patient, patient's family, and facility notified of DC. Assessment, Fl2/Pasrr, and Discharge Summary sent to facility. RN given number for report [phone] ask for [place].  Patient will go to room 904A). DC packet on chart.    CSW signing off.  Reed Breech LCSWA [phone]

## 2018-03-28 MED FILL — CREON DR 36,000 UNITS CAP: 36000 | 30 days supply | Qty: 90 | Fill #0

## 2018-04-03 ENCOUNTER — Telehealth: Payer: Self-pay | Admitting: Adult Health

## 2018-04-03 DIAGNOSIS — R0600 Dyspnea, unspecified: Secondary | ICD-10-CM | POA: Diagnosis not present

## 2018-04-03 DIAGNOSIS — R262 Difficulty in walking, not elsewhere classified: Secondary | ICD-10-CM | POA: Diagnosis not present

## 2018-04-03 DIAGNOSIS — I69998 Other sequelae following unspecified cerebrovascular disease: Secondary | ICD-10-CM | POA: Diagnosis not present

## 2018-04-03 DIAGNOSIS — M6281 Muscle weakness (generalized): Secondary | ICD-10-CM | POA: Diagnosis not present

## 2018-04-03 DIAGNOSIS — I5031 Acute diastolic (congestive) heart failure: Secondary | ICD-10-CM | POA: Diagnosis not present

## 2018-04-03 DIAGNOSIS — I10 Essential (primary) hypertension: Secondary | ICD-10-CM | POA: Diagnosis not present

## 2018-04-03 NOTE — Telephone Encounter (Signed)
Copied from [place] [phone]. Topic: Quick Communication - See Telephone Encounter >> Apr 03, 2018  3:11 PM Nils Flack, Marland Kitchen wrote: CRM for notification. See Telephone encounter for: 04/03/18. Denise  from [facility]  called  Pt is starting home care orders Nursing, PT OT,Social worker, home health aid  Needs verbal for eval and treat  Cb is [phone] Fyi - bp was 128/58

## 2018-04-04 ENCOUNTER — Encounter: Payer: Self-pay | Admitting: Internal Medicine

## 2018-04-04 ENCOUNTER — Ambulatory Visit (INDEPENDENT_AMBULATORY_CARE_PROVIDER_SITE_OTHER): Payer: PPO | Admitting: Internal Medicine

## 2018-04-04 VITALS — BP 150/70 | HR 79 | Ht 63.0 in | Wt 209.0 lb

## 2018-04-04 DIAGNOSIS — Z794 Long term (current) use of insulin: Secondary | ICD-10-CM

## 2018-04-04 DIAGNOSIS — E669 Obesity, unspecified: Secondary | ICD-10-CM

## 2018-04-04 DIAGNOSIS — E1142 Type 2 diabetes mellitus with diabetic polyneuropathy: Secondary | ICD-10-CM | POA: Diagnosis not present

## 2018-04-04 NOTE — Progress Notes (Signed)
Patient ID: Amanda [name], female   DOB: [birth year], 77 y.o.   MRN: [id number]  HPI: Amanda [name] is a 77 y.o.-year-old female, returning for f/u for DM2, dx 2008, insulin-dependent since dx, uncontrolled, with complications (DR, PN, CAD). Last visit 3 weeks ago.  She is here with her husband who offers part of the history, especially related to her CBG readings, her medication doses and recent hospitalization. She has polycythemia vera and sees Dr. [name].  She was admitted to the hospital right after our last visit (03/22/2018) for stroke - per review of her MRI report: Punctate small vessel type acute ischemic nonhemorrhagic infarct involving the posterior right centrum semi ovale.  Last hemoglobin A1c was: 03/17/2018: HbA1c calculated from fructosamine is the best in a long while, at 6.35%. 08/30/2017: HbA1c calculated from fructosamine: 7.7% 05/06/2017: HbA1c calculated from the fructosamine appears better, at 6.7%. 02/11/2016: HbA1c from  fructosamine is 7.5% Lab Results  Component Value Date   HGBA1C 7.9 (H) 03/23/2018   HGBA1C 8.4 (H) 01/10/2018   HGBA1C 9.6 10/28/2015  07/01/2015: HbA1c from fructosamine 6.57%. 01/27/2015: HbA1c from fructosamine: 6.57% 05/16/2014: HbA1c from fructosamine: 7.95%. - in 10/16/2012, hemoglobin A1c was 6.9% - in 07/2012, hemoglobin A1c was 9.9% - in 11/16/2010, hemoglobin A1c was 12.8% - in 03/2011, hemoglobin A1c was 6.9% - In 07/10/2010 her hemoglobin A1c was 10.5% C-peptide was 6.3 and 01/2011.  On U500 pens -regimen as designed at last visit: - Trulicity 4.53 mg weekly - added 03/17/2018 >> did not start yet - U500 insulin (actually taking 100 units 2x a day) 85 units before b'fast 25 units before dinner 85 units before dinner  in the hospital: - NovoLog U100 SSI: CBG 70 - 120: 0 units  CBG 121 - 150: 1 unit  CBG 151 - 200: 2 units  CBG 201 - 250: 3 units  CBG 251 - 300: 5 units  CBG 301 - 350: 7 units  CBG 351 - 400 9 units  CBG >  400 call MD  Now on: - U500 100 units 2x a day  Stopped Invokana 100 mg in the past b/c of negative publicity.  She could not tolerate doses of metformin XR higher than 250 mg twice a day in the past due to diarrhea. She does not want to resume this. Was previously on the Victoza 1.8 mg daily - "made me sick". She was previously on Janumet between 2012-2013.  She checks sugars 3-4x a day - since d/c - am: 200-250, 365 >> 233, 291 >> 170-240 >> 121 - 2h after b'fast: 238 >> n/c >> 167 >> n/c - before lunch:  69, 90-198, 250 >> n/c >> 170-240 >> n/c - 2h after lunch:  n/c >> 379, 406 >> n/c - before dinner:  170-210 >> 241 >> 140-210 >> 86 - bedtime: 180-220 >> n/c >> 200-305 >> n/c Lowest sugar was 23 in the hospital (?!) >> 200 >> 65 (at night) x 1 >> 86; she has hypoglycemia awareness ~100. Highest 452 >> 270 >> 121  She was seen for nutritional advice and diabetes education, with last visit on 04/2012 - [name]..  - + CKD, last BUN/creatinine:  Lab Results  Component Value Date   BUN 26 (H) 03/26/2018   CREATININE 1.46 (H) 03/26/2018  On Cozaar.  + HL; latest Lipid panel: Lab Results  Component Value Date   CHOL 167 03/23/2018   HDL 25 (L) 03/23/2018   LDLCALC 93 03/23/2018   LDLDIRECT 103.0  02/14/2017   TRIG 247 (H) 03/23/2018   CHOLHDL 6.7 03/23/2018  On pravastatin. - last eye exam was in 04/2017: No DR.   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  + cataracts -+ Numbness tingling (peripheral neuropathy>> saw PCP, podiatrist, orthopedist >> tried capsaicin cream, compression stockings, n now on Neurontin and neuropathy cream On ASA 81.  She also has a history of hypertension, diastolic dysfunction, polycytemia vera, gout, psoriasis, depression, history of Bell's palsy, obesity, vitamin D deficiency.  On 01/27/2015 she had an AMI >> had 2 stents placed.  ROS: Constitutional: no weight gain/no weight loss, no fatigue, no  subjective hyperthermia, no subjective hypothermia Eyes: no blurry vision, no xerophthalmia ENT: no sore throat, no nodules palpated in throat, no dysphagia, no odynophagia, no hoarseness Cardiovascular: no CP/no SOB/no palpitations/no leg swelling Respiratory: no cough/no SOB/no wheezing Gastrointestinal: no N/no V/no D/no C/no acid reflux Musculoskeletal: no muscle aches/no joint aches Skin: no rashes, no hair loss, + bruises on arm 2/2 blood draws Neurological: no tremors/no numbness/no tingling/no dizziness  I reviewed pt's medications, allergies, PMH, social hx, family hx, and changes were documented in the history of present illness. Otherwise, unchanged from my initial visit note.  Past Medical History:  Diagnosis Date  . Allergic rhinitis 01/23/2016  . C. difficile colitis   . CAD (coronary artery disease)    a. s/p STEMI in 01/2015 with 95% LCx stenosis and distal 80% LCx stenosis (DESx2 placed)  . Cancer ([place])    SKIN  . Candidiasis of skin 09/30/2014  . Cirrhosis ([place])   . Depression   . Diabetes mellitus 2008  . Gout   . Herpes simplex   . Hyperglycemia 05/31/2013  . Hyperlipidemia   . Hypertension   . Myocardial infarction ([place])   . Neuromuscular disorder ([place])    BELL PALSY  . Obesity   . Polycythemia    Dr. Elease Hashimoto- [facility]  . Psoriasis    Past Surgical History:  Procedure Laterality Date  . BREAST SURGERY Left    milk duct  . CATARACT EXTRACTION, BILATERAL    . COLONOSCOPY    . LEFT HEART CATHETERIZATION WITH CORONARY ANGIOGRAM N/A 01/27/2015   Procedure: LEFT HEART CATHETERIZATION WITH CORONARY ANGIOGRAM;  Surgeon: [name], MD;  Location: [facility] CATH LAB;  Service: Cardiovascular;  Laterality: N/A;  . TONSILLECTOMY    . TOTAL ABDOMINAL HYSTERECTOMY W/ BILATERAL SALPINGOOPHORECTOMY     for heavy periods with appendectomy   Social History   Socioeconomic History  . Marital status: Married    Spouse name: Not on file  . Number of children: 2   . Years of education: 2 yr colle  . Highest education level: Not on file  Occupational History  . Occupation: housewife  Social Needs  . Financial resource strain: Not on file  . Food insecurity:    Worry: Not on file    Inability: Not on file  . Transportation needs:    Medical: Not on file    Non-medical: Not on file  Tobacco Use  . Smoking status: Former Smoker    Years: 48.00    Types: Cigarettes    Last attempt to quit: 10/18/1986    Years since quitting: 31.4  . Smokeless tobacco: Never Used  Substance and Sexual Activity  . Alcohol use: No    Alcohol/week: 0.0 oz  . Drug use: No  . Sexual activity: Yes    Partners: Male  Birth control/protection: None  Lifestyle  . Physical activity:    Days per week: Not on file    Minutes per session: Not on file  . Stress: Not on file  Relationships  . Social connections:    Talks on phone: Not on file    Gets together: Not on file    Attends religious service: Not on file    Active member of club or organization: Not on file    Attends meetings of clubs or organizations: Not on file    Relationship status: Not on file  . Intimate partner violence:    Fear of current or ex partner: Not on file    Emotionally abused: Not on file    Physically abused: Not on file    Forced sexual activity: Not on file  Other Topics Concern  . Not on file  Social History Narrative   2 caffeine drink per day.  No regular exercise.     Retired from the bank   2 children (Daughter and a son) son has morbid obesity   2 grandchildren   3 great grandchildren   Current Outpatient Medications on File Prior to Visit  Medication Sig Dispense Refill  . amLODipine (NORVASC) 10 MG tablet Take 1 tablet (10 mg total) by mouth daily. 30 tablet 0  . aspirin 325 MG tablet Take 1 tablet (325 mg total) by mouth daily. 30 tablet 0  . Blood Glucose Monitoring Suppl (ACCU-CHEK GUIDE) w/Device KIT 1 kit by Does not apply route as directed. 1 kit 0  .  clopidogrel (PLAVIX) 75 MG tablet Take 1 tablet (75 mg total) by mouth daily. 30 tablet 0  . diphenoxylate-atropine (LOMOTIL) 2.5-0.025 MG tablet Take 1-2 tablets by mouth 2 (two) times daily as needed for diarrhea or loose stools. 60 tablet 0  . gabapentin (NEURONTIN) 100 MG capsule Take 1 capsule (100 mg total) by mouth 2 (two) times daily. 60 capsule 0  . glucose blood (ACCU-CHEK GUIDE) test strip Use to check blood sugars 3 times daily 100 each 12  . hydrOXYzine (ATARAX/VISTARIL) 10 MG tablet Take 1 tablet (10 mg total) by mouth 3 (three) times daily as needed for anxiety. 20 tablet 0  . insulin aspart (NOVOLOG) 100 UNIT/ML injection Inject 0-9 Units into the skin every 4 (four) hours. CBG < 70: implement hypoglycemia protocol  CBG 70 - 120: 0 units  CBG 121 - 150: 1 unit  CBG 151 - 200: 2 units  CBG 201 - 250: 3 units  CBG 251 - 300: 5 units  CBG 301 - 350: 7 units  CBG 351 - 400 9 units  CBG > 400 call MD 10 mL 0  . lipase/protease/amylase (CREON) 36000 UNITS CPEP capsule Take 1 capsule (36,000 Units total) by mouth 3 (three) times daily with meals. 180 capsule 3  . loperamide (IMODIUM A-D) 2 MG tablet Take 1-2 tablets (2-4 mg total) by mouth daily as needed for diarrhea or loose stools. 30 tablet 1  . ondansetron (ZOFRAN ODT) 4 MG disintegrating tablet Take 1 tablet (4 mg total) by mouth every 8 (eight) hours as needed. (Patient taking differently: Take 4 mg by mouth every 8 (eight) hours as needed for nausea or vomiting. ) 10 tablet 0  . promethazine (PHENERGAN) 25 MG tablet Take 0.5 tablets (12.5 mg total) by mouth every 6 (six) hours as needed for nausea or vomiting. 30 tablet 3  . rosuvastatin (CRESTOR) 20 MG tablet TAKE 1 TABLET(20 MG) BY MOUTH DAILY  90 tablet 3  . ruxolitinib phosphate (JAKAFI) 25 MG tablet Take 1 tablet (25 mg total) by mouth 2 (two) times daily. 60 tablet 11  . triamcinolone ointment (KENALOG) 0.5 % Apply 1 application topically 2 (two) times daily. 30 g 0   No  current facility-administered medications on file prior to visit.    Allergies  Allergen Reactions  . Lisinopril Cough  . Tape Hives  . Doxycycline Hives, Swelling and Rash  . Latex Hives, Itching and Rash   Family History  Problem Relation Age of Onset  . Diabetes Mother   . Heart disease Mother        CAD  . Hyperlipidemia Mother   . Hypertension Mother   . Kidney disease Mother   . Kidney disease Father   . Breast cancer Maternal Aunt   . Heart disease Maternal Grandmother   . Heart disease Other        maternal aunts and uncles    PE: BP (!) 150/70   Pulse 79   Ht 5' 3"  (1.6 m)   Wt 209 lb (94.8 kg)   SpO2 98%   BMI 37.02 kg/m  Body mass index is 37.02 kg/m. Wt Readings from Last 3 Encounters:  04/04/18 209 lb (94.8 kg)  03/23/18 221 lb 5.5 oz (100.4 kg)  03/17/18 215 lb 12.8 oz (97.9 kg)   Constitutional: overweight, in NAD Eyes: PERRLA, EOMI, no exophthalmos ENT: moist mucous membranes, no thyromegaly, no cervical lymphadenopathy Cardiovascular: RRR, No MRG Respiratory: CTA B Gastrointestinal: abdomen soft, NT, ND, BS+ Musculoskeletal: no deformities, strength intact in all 4 Skin: moist, warm, no rashes Neurological: no tremor with outstretched hands, DTR normal in all 4  ASSESSMENT: 1. 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 Diabetic peripheral neuropathy  3.  Obesity  PLAN:  1. Patient with long-standing, uncontrolled, type 2 diabetes, very insulin resistant, on U500 regimen and now Trulicity added at last visit.  She was recently admitted with a stroke and was noted that she was grossly noncompliant with her insulin doses and her diet at the time of the admission.  While in the hospital, she only required sliding scale insulin with good diabetes control.  Therefore, she was discharged on sliding scale insulin and advised to follow-up with me as soon as possible. - at this visit,  sugars are much better after the hospital discharge, on her previous insulin regimen. She only has 2 CBGs since d/c and they are excellent. For now, will continue current regimen but will reduce the doses if she is able to start Trulicity. I advised them to let me know if the sugars are uncontrolled, we may need to change the doses then - I advised her to: Patient Instructions  Please continue: - U500 insulin  85 units before b'fast 25 units before dinner 85 units before dinner  If you start Trulicity 6.30 mcg, then change insulin doses: - U500:  80 units before b'fast  80 units before dinner  Please return in ~3 months with your sugar log.   - continue checking sugars at different times of the day - check 3x a day, rotating checks - advised for yearly eye exams >> she is UTD - Return to clinic in 3 mo with sugar log    2. Diabetic peripheral neuropathy - stable -Continue Neurontin 600 mg twice a day -Also continues neuropathy cream:  Amitriptyline 2%, Lidocaine 2%, and Ketamine 1% -takes this as needed  3. Obesity - She lost weight since last visit - most likely fluid - Trulicity will help with weight loss also  [name], MD PhD [facility] Endocr[facility]ology

## 2018-04-04 NOTE — Telephone Encounter (Signed)
Pt has hospital follow up on 04/05/17.

## 2018-04-04 NOTE — Patient Instructions (Addendum)
Please continue: - U500 insulin  85 units before b'fast 25 units before dinner 85 units before dinner  If you start Trulicity 0.25 mcg, then change insulin doses: - U500:  80 units before b'fast  80 units before dinner  Please return in ~3 months with your sugar log.

## 2018-04-05 ENCOUNTER — Ambulatory Visit (INDEPENDENT_AMBULATORY_CARE_PROVIDER_SITE_OTHER): Payer: PPO | Admitting: Adult Health

## 2018-04-05 ENCOUNTER — Telehealth: Payer: Self-pay | Admitting: Adult Health

## 2018-04-05 ENCOUNTER — Telehealth: Payer: Self-pay | Admitting: Internal Medicine

## 2018-04-05 ENCOUNTER — Encounter: Payer: Self-pay | Admitting: Adult Health

## 2018-04-05 VITALS — BP 142/56 | HR 83 | Temp 97.7°F | Wt 209.2 lb

## 2018-04-05 DIAGNOSIS — I639 Cerebral infarction, unspecified: Secondary | ICD-10-CM

## 2018-04-05 DIAGNOSIS — D45 Polycythemia vera: Secondary | ICD-10-CM | POA: Diagnosis not present

## 2018-04-05 DIAGNOSIS — E1142 Type 2 diabetes mellitus with diabetic polyneuropathy: Secondary | ICD-10-CM

## 2018-04-05 DIAGNOSIS — Z794 Long term (current) use of insulin: Secondary | ICD-10-CM

## 2018-04-05 DIAGNOSIS — I10 Essential (primary) hypertension: Secondary | ICD-10-CM | POA: Diagnosis not present

## 2018-04-05 LAB — CBC WITH DIFFERENTIAL/PLATELET
BASOS ABS: 0.2 10*3/uL — AB (ref 0.0–0.1)
Basophils Relative: 1 % (ref 0.0–3.0)
EOS ABS: 0.4 10*3/uL (ref 0.0–0.7)
Eosinophils Relative: 1.9 % (ref 0.0–5.0)
HCT: 31.6 % — ABNORMAL LOW (ref 36.0–46.0)
Hemoglobin: 10.6 g/dL — ABNORMAL LOW (ref 12.0–15.0)
LYMPHS ABS: 1.2 10*3/uL (ref 0.7–4.0)
Lymphocytes Relative: 5.2 % — ABNORMAL LOW (ref 12.0–46.0)
MCHC: 33.4 g/dL (ref 30.0–36.0)
MCV: 91.4 fl (ref 78.0–100.0)
MONOS PCT: 6.2 % (ref 3.0–12.0)
Monocytes Absolute: 1.5 10*3/uL — ABNORMAL HIGH (ref 0.1–1.0)
NEUTROS ABS: 20.1 10*3/uL — AB (ref 1.4–7.7)
NEUTROS PCT: 85.7 % — AB (ref 43.0–77.0)
PLATELETS: 541 10*3/uL — AB (ref 150.0–400.0)
RBC: 3.46 Mil/uL — AB (ref 3.87–5.11)
RDW: 16.1 % — ABNORMAL HIGH (ref 11.5–15.5)
WBC: 23.4 10*3/uL (ref 4.0–10.5)

## 2018-04-05 MED ORDER — CLOPIDOGREL BISULFATE 75 MG PO TABS: 75.0000 mg | ORAL_TABLET | Freq: Every day | ORAL | 1 refills | Status: AC

## 2018-04-05 NOTE — Telephone Encounter (Signed)
Amanda [name] from [place] notified to proceed with nursing, pt, ot, Education officer, museum and home health aid.  Please see message on lasix.

## 2018-04-05 NOTE — Telephone Encounter (Signed)
Call received from [facility] with critical labs WBC is 23.4  Will send to [facility]   Results for orders placed or performed in visit on 04/05/18 (from the past 24 hour(s))  CBC with Differential/Platelet     Status: Abnormal   Collection Time: 04/05/18  9:10 AM  Result Value Ref Range   WBC 23.4 Repeated and verified X2. (HH) 4.0 - 10.5 K/uL   RBC 3.46 (L) 3.87 - 5.11 Mil/uL   Hemoglobin 10.6 (L) 12.0 - 15.0 g/dL   HCT 31.6 (L) 36.0 - 46.0 %   MCV 91.4 78.0 - 100.0 fl   MCHC 33.4 30.0 - 36.0 g/dL   RDW 16.1 (H) 11.5 - 15.5 %   Platelets 541.0 (H) 150.0 - 400.0 K/uL   Neutrophils Relative % 85.7 (H) 43.0 - 77.0 %   Lymphocytes Relative 5.2 (L) 12.0 - 46.0 %   Monocytes Relative 6.2 3.0 - 12.0 %   Eosinophils Relative 1.9 0.0 - 5.0 %   Basophils Relative 1.0 0.0 - 3.0 %   Neutro Abs 20.1 (H) 1.4 - 7.7 K/uL   Lymphs Abs 1.2 0.7 - 4.0 K/uL   Monocytes Absolute 1.5 (H) 0.1 - 1.0 K/uL   Eosinophils Absolute 0.4 0.0 - 0.7 K/uL   Basophils Absolute 0.2 (H) 0.0 - 0.1 K/uL

## 2018-04-05 NOTE — Telephone Encounter (Signed)
Created in error

## 2018-04-05 NOTE — Progress Notes (Signed)
Subjective:    Patient ID: Amanda [name], female    DOB: 27-Oct-1940, 77 y.o.   MRN: [id number]  HPI 77 year old female who  has a past medical history of Allergic rhinitis (01/23/2016), C. difficile colitis, CAD (coronary artery disease), Cancer ([place]), Candidiasis of skin (09/30/2014), Cirrhosis ([place]), Depression, Diabetes mellitus (2008), Gout, Herpes simplex, Hyperglycemia (05/31/2013), Hyperlipidemia, Hypertension, Myocardial infarction [facility]), Neuromuscular disorder ([place]), Obesity, Polycythemia, and Psoriasis.  She presents to the office today for today for hospital follow up. She was seen by Endocrinology yesterday   She was admitted on 03/22/2018  She was discharged on 03/26/2018   Per hospital note she presented to the emergency room have after having intermittent lightheadedness and dizzy over the last few weeks.  On the day that she presented to the ER she stood up from playing cards and could not move her legs she had been having some numbness and tingling in her toes, she does have a history of diabetic neuropathy.  Smell 3 months prior to this hospital admission patient was admitted to the hospital in [place] for reported pneumonia, patient was essentially bedbound for approximately 8 days.  She ultimately left the hospital AMA to travel back home to [place].  Patient reports that since that discharge she has been markedly weak in her legs with difficulty ambulating.  Over the last several months the patient has been essentially either chair bound or bedbound with difficulty performing ADLs  Hospital Course   Lower Extremity Weakness with small infarcts seen on MRI   Neurology was consulted for infarcts  seen on MRI (Punctate small vessel type acute ischemic nonhemorrhagic infarct involving the posterior right centrum semi ovale). but were not likely responsible for presenting lower extremity weakness.  She had a bilateral carotid Dopplers did not show a thickened stenosis.   Echocardiogram was unremarkable and the bubble study was difficult to interpret.  Neurology then recommended an MRA but she was unable to tolerate this procedure.  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  Patient was seen by physical therapy while in the hospital who recommended skilled nursing facility placement.  She completed PT at [facility] and has home health PT OT coming into the home currently.  Dyspnea -She will chest x-ray was reviewed which findings consistent with small edema.  She does not underwent follow-up CT of the chest with findings of underlying COPD but otherwise unremarkable.  She was continued on Xopenex and minimal O2 support  Diabetic Peripheral Neuropathy  Patient on U5 100 insulin 30 units 3 times daily prior to admit,but the patient was noncompliant with diabetic diet.  Hospital she only required sliding scale coverage with good diabetes control.  Dr. Cruzita Lederer started her on Trulicity yesterday continued her on a U5 100 insulin regimen in the office today the patient reports that her blood sugar this morning was 199.  Does not appear as though she is following a diabetic diet at home  Essential Hypertension -  -Continued on Norvasc, but Cozaar was held secondary to concerns for acute renal failure.  Today in the office she reports that she is more frustrated that this happened to her than anything else.  She has just started at home health PT and plans to go through with this.  Her husband who is with her at this visit today reports that she is getting upset more often and is often yelling at him for things  that he cannot control.  Reports continued weakness in lower extremities, but since being discharged from the hospital she is having some better days when it comes to the lower extremity weakness.  She is using rolling wheelchair with seat at home and is trying to become more active.  He denies any  slurred speech, facial droop, headaches, or blurred vision.  Is prescribed Plavix in the hospital but has not started taking this medication yet as she said the pharmacy "did not have it".  I will send in a new prescription today  Review of Systems   See HPI   Past Medical History:  Diagnosis Date  . Allergic rhinitis 01/23/2016  . C. difficile colitis   . CAD (coronary artery disease)    a. s/p STEMI in 01/2015 with 95% LCx stenosis and distal 80% LCx stenosis (DESx2 placed)  . Cancer ([place])    SKIN  . Candidiasis of skin 09/30/2014  . Cirrhosis ([place])   . Depression   . Diabetes mellitus 2008  . Gout   . Herpes simplex   . Hyperglycemia 05/31/2013  . Hyperlipidemia   . Hypertension   . Myocardial infarction ([place])   . 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 Obesity   . Polycythemia    Dr. Elease Hashimoto- [facility]  . Psoriasis     Social History   Socioeconomic History  . Marital status: Married    Spouse name: Not on file  . Number of children: 2  . Years of education: 2 yr colle  . Highest education level: Not on file  Occupational History  . Occupation: housewife  Social Needs  . Financial resource strain: Not on file  . Food insecurity:    Worry: Not on file    Inability: Not on file  . Transportation needs:    Medical: Not on file    Non-medical: Not on file  Tobacco Use  . Smoking status: Former Smoker    Years: 48.00    Types: Cigarettes    Last attempt to quit: 10/18/1986    Years since quitting: 31.4  . Smokeless tobacco: Never Used  Substance and Sexual Activity  . Alcohol use: No    Alcohol/week: 0.0 oz  . Drug use: No  . Sexual activity: Yes    Partners: Male    Birth control/protection: None  Lifestyle  . Physical activity:    Days per week: Not on file    Minutes per session: Not on file  . Stress: Not on file  Relationships  . Social connections:    Talks on phone: Not on file    Gets together: Not on file    Attends religious  service: Not on file    Active member of club or organization: Not on file    Attends meetings of clubs or organizations: Not on file    Relationship status: Not on file  . Intimate partner violence:    Fear of current or ex partner: Not on file    Emotionally abused: Not on file    Physically abused: Not on file    Forced sexual activity: Not on file  Other Topics Concern  . Not on file  Social History Narrative   2 caffeine drink per day.  No regular exercise.     Retired from the bank   2 children (Daughter and a son) son has morbid obesity   2 grandchildren   3 great grandchildren    Past Surgical  History:  Procedure Laterality Date  . BREAST SURGERY Left    milk duct  . CATARACT EXTRACTION, BILATERAL    . COLONOSCOPY    . LEFT HEART CATHETERIZATION WITH CORONARY ANGIOGRAM N/A 01/27/2015   Procedure: LEFT HEART CATHETERIZATION WITH CORONARY ANGIOGRAM;  Surgeon: [name], MD;  Location[facility] C[facility] LAB;  Service: Cardiovascular;  Laterality: N/A;  . TONSILLECTOMY    . TOTAL ABDOMINAL HYSTERECTOMY W/ BILATERAL SALPINGOOPHORECTOMY     for heavy periods with appendectomy    Family History  Problem Relation Age of Onset  . Diabetes Mother   . Heart disease Mother        CAD  . Hyperlipidemia Mother   . Hypertension Mother   . Kidney disease Mother   . Kidney disease Father   . Breast cancer Maternal Aunt   . Heart disease Maternal Grandmother   . Heart disease Other        maternal aunts and uncles    Allergies  Allergen Reactions  . Lisinopril Cough  . Tape Hives  . Doxycycline Hives, Swelling and Rash  . Latex Hives, Itching and Rash    Current Outpatient Medications on File Prior to Visit  Medication Sig Dispense Refill  . aspirin 325 MG tablet Take 1 tablet (325 mg total) by mouth daily. 30 tablet 0  . Blood Glucose Monitoring Suppl (ACCU-CHEK GUIDE) w/Device KIT 1 kit by Does not apply route as directed. 1 kit 0  . clopidogrel (PLAVIX) 75 MG tablet Take  1 tablet (75 mg total) by mouth daily. 30 tablet 0  . diphenoxylate-atropine (LOMOTIL) 2.5-0.025 MG tablet Take 1-2 tablets by mouth 2 (two) times daily as needed for diarrhea or loose stools. 60 tablet 0  . gabapentin (NEURONTIN) 100 MG capsule Take 1 capsule (100 mg total) by mouth 2 (two) times daily. 60 capsule 0  . glucose blood (ACCU-CHEK GUIDE) test strip Use to check blood sugars 3 times daily 100 each 12  . lipase/protease/amylase (CREON) 36000 UNITS CPEP capsule Take 1 capsule (36,000 Units total) by mouth 3 (three) times daily with meals. 180 capsule 3  . loperamide (IMODIUM A-D) 2 MG tablet Take 1-2 tablets (2-4 mg total) by mouth daily as needed for diarrhea or loose stools. 30 tablet 1  . ondansetron (ZOFRAN ODT) 4 MG disintegrating tablet Take 1 tablet (4 mg total) by mouth every 8 (eight) hours as needed. (Patient taking differently: Take 4 mg by mouth every 8 (eight) hours as needed for nausea or vomiting. ) 10 tablet 0  . promethazine (PHENERGAN) 25 MG tablet Take 0.5 tablets (12.5 mg total) by mouth every 6 (six) hours as needed for nausea or vomiting. 30 tablet 3  . rosuvastatin (CRESTOR) 20 MG tablet TAKE 1 TABLET(20 MG) BY MOUTH DAILY 90 tablet 3  . ruxolitinib phosphate (JAKAFI) 25 MG tablet Take 1 tablet (25 mg total) by mouth 2 (two) times daily. 60 tablet 11  . triamcinolone ointment (KENALOG) 0.5 % Apply 1 application topically 2 (two) times daily. 30 g 0  . amLODipine (NORVASC) 10 MG tablet Take 1 tablet (10 mg total) by mouth daily. (Patient not taking: Reported on 04/05/2018) 30 tablet 0   No current facility-administered medications on file prior to visit.     BP (!) 142/56 (BP Location: Right Arm, Patient Position: Sitting, Cuff Size: Normal)   Pulse 83   Temp 97.7 F (36.5 C) (Oral)   Wt 209 lb 3.2 oz (94.9 kg)   SpO2 (!) 87%  BMI 37.06 kg/m       Objective:   Physical Exam  Constitutional: She is oriented to person, place, and time. She appears  well-developed and well-nourished. No distress.  Eyes: Pupils are equal, round, and reactive to light. EOM are normal. Right eye exhibits no discharge. Left eye exhibits no discharge.  Neck: Normal range of motion. Neck supple.  Cardiovascular: Normal rate, regular rhythm, normal heart sounds and intact distal pulses. Exam reveals no gallop and no friction rub.  No murmur heard. Pulmonary/Chest: Effort normal and breath sounds normal. No stridor. No respiratory distress. She has no wheezes. She has no rales. She exhibits no tenderness.  Abdominal: Soft. Bowel sounds are normal. She exhibits no distension and no mass. There is no tenderness. There is no rebound and no guarding. No hernia.  Neurological: She is alert and oriented to person, place, and time. No cranial nerve deficit or sensory deficit. She displays a negative Romberg sign. GCS eye subscore is 4. GCS verbal subscore is 5. GCS motor subscore is 6.  4/5 strength in bilateral lower extremities  4/5 strength in bilateral upper extremities, slightly less weaker on right    Skin: Skin is warm and dry. Capillary refill takes less than 2 seconds. She is not diaphoretic.  Bruising noted on bilateral arms  Psychiatric: Her speech is normal. Judgment and thought content normal. She is agitated. Cognition and memory are normal. She exhibits a depressed mood.  Nursing note and vitals reviewed.     Assessment & Plan:  1. Cerebrovascular accident (CVA), unspecified mechanism ([place]) - clopidogrel (PLAVIX) 75 MG tablet; Take 1 tablet (75 mg total) by mouth daily.  Dispense: 90 tablet; Refill: 1 - CBC with Differential/Platelet - Basic metabolic panel; Future - Neurology did not think that infarcts were the cause of her lower extremity weakness, do not need follow up at this time   2. [name] ([place])  - CBC with Differential/Platelet  3. Type 2 diabetes mellitus with diabetic polyneuropathy, with long-term current use of insulin  (HCC) - Strongly encouraged diabetic diet  - Follow up with Endocrinology as directed - Basic metabolic panel; Future  4. Essential hypertension - Basic metabolic panel; Future  [name], NP

## 2018-04-05 NOTE — Telephone Encounter (Signed)
Patient wanted the PCP to know that the hospital also gave her Lasix. They wanted to know if the provider still wanted her to be on it.  If so, they will need a prescription and they need to have it sent to their preferred pharmacy, [facility] on [address].

## 2018-04-05 NOTE — Telephone Encounter (Signed)
Ok for verbal order  °

## 2018-04-06 ENCOUNTER — Other Ambulatory Visit: Payer: Self-pay

## 2018-04-06 ENCOUNTER — Telehealth: Payer: Self-pay | Admitting: Adult Health

## 2018-04-06 MED ORDER — AMLODIPINE BESYLATE 10 MG PO TABS: 10.0000 mg | ORAL_TABLET | Freq: Every day | ORAL | 1 refills | Status: DC

## 2018-04-06 NOTE — Telephone Encounter (Signed)
Spoke to the nurse.  Instructed her to proceed with pt, ot, nursing, social worker, home health aid and eval.  Also advised the pt does NOT need to continue furosemide.  Amanda [name] informed me that the pt is still taking her losartan.  Advised her to have pt stop losartan and take amlodipine per note.  Pt does not have rx for amlodipine.  Will need approval to send to pharmacy.  Please advise.

## 2018-04-06 NOTE — Telephone Encounter (Signed)
Sent to the pharmacy by e-scribe. 

## 2018-04-06 NOTE — Telephone Encounter (Signed)
Ok to send in Norvasc 10 mg daily 90 +1. Please have patient monitor BP at home

## 2018-04-06 NOTE — Telephone Encounter (Signed)
Received package from [facility] with pt's name on it. Package was filled with Trulicity. Qt: 24 prefilled syringes. Pt was called and notified of this and pt stated that her husband will be coming to pick up the package.

## 2018-04-06 NOTE — Telephone Encounter (Signed)
She does not need to be on lasix at this time. She was given it in the hospital but they did not recommend it as an outpatient prescription

## 2018-04-06 NOTE — Telephone Encounter (Signed)
Copied from [place] [phone]. Topic: Quick Communication - See Telephone Encounter >> Apr 06, 2018 10:11 AM Robina Ade, Helene Kelp D wrote: CRM for notification. See Telephone encounter for: 04/06/18. Neomia Dear with [facility] called to let [name] know that she did patient's eval for OT and after evaluating her pt does not need further OT. If there are any question she can be reached at [phone], thanks.

## 2018-04-06 NOTE — Telephone Encounter (Signed)
Noted  

## 2018-04-06 NOTE — Patient Outreach (Signed)
[facility]) Care Management  04/06/2018  Amanda [name] 12-31-1940 [id number]  Transition of care  Referral date: 04/06/18 Referral source: discharged from an inpatient admission from [facility] and [facility] on 04/01/2018. Insurance: Health team advantage  Telephone call to patient regarding transition of care referral. HIPAA  Verified with patient. Explained reason for call. Patient gave verbal authorization to speak with her husband, Amanda [name] regarding her health information. Patient states she was recently in the hospital due to stroke then transferred to a nursing facility for rehab. Patient states she currently has nursing and physical therapy services with [facility] .  Patient states she saw her primary MD on 04/04/18.  She states her primary provider prescribed her a blood thinner. Patient states she has all of her medications and takes them as prescribed. RNCM reviewed signs/ symptoms of bleeding. Advised to call doctor for non emergent symptoms.   Patient requested RNCM review the medications with her husband because she does not know names of herm medicines.  Patient states she is still having some weakness. RNCM reviewed signs/ symptoms of stroke with patient. Advised to call 911 for stroke like symptoms.  Patient advised to keep follow up appointments. Patient states her husband provides her transportation.  RNCM discussed and offered ongoing transition of care follow up.  Patient verbally agreed.   PLAN; RNCM will follow up with patient within 1 week.  RNCM will send patient [facility] care management welcome packet.  RNCM will send involvement letter to patients primary provider.   Quinn Plowman RN,BSN,CCM [facility] Telephonic  [phone]

## 2018-04-07 ENCOUNTER — Telehealth: Payer: Self-pay | Admitting: Adult Health

## 2018-04-07 ENCOUNTER — Other Ambulatory Visit: Payer: Self-pay | Admitting: Adult Health

## 2018-04-07 ENCOUNTER — Telehealth: Payer: Self-pay

## 2018-04-07 NOTE — Telephone Encounter (Signed)
Received shipment for [facility] of [place] U500 insulin for pt. There is 8 boxes of insulin and 2 pens of insulin in each box for a total of 18 pens. Pt was called and notified of this and she verbalized understanding.

## 2018-04-07 NOTE — Telephone Encounter (Signed)
Copied from [place] [phone]. Topic: Quick Communication - See Telephone Encounter >> Apr 07, 2018  4:51 PM Rutherford Nail, NT wrote: CRM for notification. See Telephone encounter for: 04/07/18. [name], physical therapist with [facility] calling for verbal order for physical therapy for the following:  2x a week for 5 weeks 1x a week for 2 weeks Effecting 04/09/18 CB#: [phone]

## 2018-04-10 ENCOUNTER — Telehealth: Payer: Self-pay | Admitting: Internal Medicine

## 2018-04-10 NOTE — Telephone Encounter (Signed)
Returned call to [place].  She reports patient's status has not changed. She is alert and oriented and was able to get up w/ standby assistance from spouse after fall, she has no redness, bruising, or swelling to her face. Patient reported that she immediately applied ice to cheek after fall. HH RN is scheduled to see patient later today.  Advised [name] to continue to monitor patient for now and go to ED if she develops bleeding, confusion, weakness, etc.  [name] has already informed the [facility] RN of the fall. Cory-FYI.

## 2018-04-10 NOTE — Telephone Encounter (Signed)
DENIED.  PT IS NO LONGER TAKING THIS MEDICATION.

## 2018-04-10 NOTE — Telephone Encounter (Signed)
[name] is calling and states that the patient had a fall this morning she slid off the bed and hit the left side of her face. States she is not hurting, no bruising, no pain at all. Just wanted Tommi Rumps to be aware.

## 2018-04-10 NOTE — Telephone Encounter (Signed)
Ok for verbal orders ?

## 2018-04-10 NOTE — Telephone Encounter (Signed)
States if pt needs to go to ER please call pt directly.

## 2018-04-10 NOTE — Telephone Encounter (Signed)
Will orders need to come from specialist?

## 2018-04-10 NOTE — Telephone Encounter (Signed)
Amanda [name] is calling back and reports pt is on blood thinners and would like to know if the pt should go to the ER since she fell this morning? She states there is not bruising that she can see. [phone]

## 2018-04-10 NOTE — Telephone Encounter (Signed)
[name] notified to continue with PT.  No further action required.

## 2018-04-10 NOTE — Telephone Encounter (Signed)
[name] CB# [phone]. Also would like to follow up on the verbals she requested.

## 2018-04-11 ENCOUNTER — Telehealth: Payer: Self-pay | Admitting: *Deleted

## 2018-04-11 NOTE — Telephone Encounter (Signed)
Left a message on identifying voicemail informing Amanda [name] that I was unsure as to what paper work he is referring to.  Will wait on a return call.

## 2018-04-11 NOTE — Telephone Encounter (Signed)
Copied from [place] [phone]. Topic: Inquiry >> Apr 11, 2018  2:51 PM Mylinda Latina, NT wrote: Reason for CRM: Patient husband called and states that [employer] called and he wanted to let her know that he will come pick up the paperwork tomorrow. He doesn't want it mailed.  If this has already been mailed please call patient CB# [phone] Please advise.

## 2018-04-12 ENCOUNTER — Telehealth: Payer: Self-pay

## 2018-04-12 ENCOUNTER — Ambulatory Visit: Payer: Self-pay

## 2018-04-12 NOTE — Telephone Encounter (Signed)
Amanda [name] came by the office today and was given a copy of pt's lab results.  No further action required.

## 2018-04-12 NOTE — Telephone Encounter (Signed)
Dr. Sarajane Jews, can you help with this?

## 2018-04-12 NOTE — Telephone Encounter (Signed)
Copied from [place] [phone]. Topic: General - Other >> Apr 12, 2018  1:10 PM Carolyn Stare wrote: Pt husband called and said wife is having diarh for the past 2 days and think the following med may be causing clopidogrel (PLAVIX) 75 MG tablet  Would like a call back   [phone]

## 2018-04-12 NOTE — Telephone Encounter (Signed)
It is hard to say if these are related or not, she has a hx of C difficile as well. Have her come in OV tomorrow and I would be glad to see her

## 2018-04-13 ENCOUNTER — Other Ambulatory Visit: Payer: Self-pay

## 2018-04-13 ENCOUNTER — Encounter: Payer: Self-pay | Admitting: *Deleted

## 2018-04-13 ENCOUNTER — Inpatient Hospital Stay: Payer: PPO | Attending: Family | Admitting: Family

## 2018-04-13 ENCOUNTER — Encounter: Payer: Self-pay | Admitting: Family

## 2018-04-13 ENCOUNTER — Inpatient Hospital Stay: Payer: PPO

## 2018-04-13 VITALS — BP 154/48 | HR 69 | Temp 97.7°F | Resp 20 | Wt 209.0 lb

## 2018-04-13 DIAGNOSIS — R0602 Shortness of breath: Secondary | ICD-10-CM | POA: Insufficient documentation

## 2018-04-13 DIAGNOSIS — Z7982 Long term (current) use of aspirin: Secondary | ICD-10-CM | POA: Diagnosis not present

## 2018-04-13 DIAGNOSIS — D45 Polycythemia vera: Secondary | ICD-10-CM | POA: Diagnosis not present

## 2018-04-13 DIAGNOSIS — N179 Acute kidney failure, unspecified: Secondary | ICD-10-CM | POA: Diagnosis not present

## 2018-04-13 DIAGNOSIS — Z79899 Other long term (current) drug therapy: Secondary | ICD-10-CM | POA: Diagnosis not present

## 2018-04-13 DIAGNOSIS — D508 Other iron deficiency anemias: Secondary | ICD-10-CM

## 2018-04-13 DIAGNOSIS — R531 Weakness: Secondary | ICD-10-CM | POA: Insufficient documentation

## 2018-04-13 LAB — CMP (CANCER CENTER ONLY)
ALT: 25 U/L (ref 10–47)
ANION GAP: 10 (ref 5–15)
AST: 51 U/L — ABNORMAL HIGH (ref 11–38)
Albumin: 3.6 g/dL (ref 3.5–5.0)
Alkaline Phosphatase: 139 U/L — ABNORMAL HIGH (ref 26–84)
BUN: 25 mg/dL — ABNORMAL HIGH (ref 7–22)
CHLORIDE: 108 mmol/L (ref 98–108)
CO2: 26 mmol/L (ref 18–33)
Calcium: 8.9 mg/dL (ref 8.0–10.3)
Creatinine: 1.3 mg/dL — ABNORMAL HIGH (ref 0.60–1.20)
Glucose, Bld: 91 mg/dL (ref 73–118)
POTASSIUM: 4.3 mmol/L (ref 3.3–4.7)
SODIUM: 144 mmol/L (ref 128–145)
TOTAL PROTEIN: 6.6 g/dL (ref 6.4–8.1)
Total Bilirubin: 0.8 mg/dL (ref 0.2–1.6)

## 2018-04-13 LAB — CBC WITH DIFFERENTIAL (CANCER CENTER ONLY)
BASOS PCT: 1 %
Band Neutrophils: 6 %
Basophils Absolute: 0.3 10*3/uL — ABNORMAL HIGH (ref 0.0–0.1)
Blasts: 0 %
EOS PCT: 5 %
Eosinophils Absolute: 1.5 10*3/uL — ABNORMAL HIGH (ref 0.0–0.5)
HCT: 28.6 % — ABNORMAL LOW (ref 34.8–46.6)
Hemoglobin: 9.1 g/dL — ABNORMAL LOW (ref 11.6–15.9)
LYMPHS ABS: 4.2 10*3/uL — AB (ref 0.9–3.3)
Lymphocytes Relative: 14 %
MCH: 30.4 pg (ref 26.0–34.0)
MCHC: 31.8 g/dL — AB (ref 32.0–36.0)
MCV: 95.7 fL (ref 81.0–101.0)
MONO ABS: 1.2 10*3/uL — AB (ref 0.1–0.9)
MONOS PCT: 4 %
Metamyelocytes Relative: 2 %
Myelocytes: 1 %
NEUTROS ABS: 22.6 10*3/uL — AB (ref 1.5–6.5)
Neutrophils Relative %: 67 %
OTHER: 0 %
PLATELETS: 933 10*3/uL — AB (ref 145–400)
Promyelocytes Relative: 0 %
RBC: 2.99 MIL/uL — ABNORMAL LOW (ref 3.70–5.32)
RDW: 16.3 % — AB (ref 11.1–15.7)
WBC Count: 29.8 10*3/uL — ABNORMAL HIGH (ref 3.9–10.0)
nRBC: 0 /100 WBC

## 2018-04-13 LAB — IRON AND TIBC
Iron: 82 ug/dL (ref 41–142)
SATURATION RATIOS: 27 % (ref 21–57)
TIBC: 304 ug/dL (ref 236–444)
UIBC: 221 ug/dL

## 2018-04-13 LAB — FERRITIN: FERRITIN: 203 ng/mL (ref 11–307)

## 2018-04-13 MED ORDER — HYDROXYUREA 500 MG PO CAPS: 500.0000 mg | ORAL_CAPSULE | Freq: Two times a day (BID) | ORAL | 3 refills | Status: DC

## 2018-04-13 NOTE — Telephone Encounter (Signed)
Phone lines are down. MyChart message sent to notify patient.

## 2018-04-13 NOTE — Addendum Note (Signed)
Addended by: Burney Gauze R on: 04/13/2018 05:54 PM   Modules accepted: Orders

## 2018-04-13 NOTE — Progress Notes (Signed)
Hematology and Oncology Follow Up Visit  Amanda [name] [id number] 1941/03/01 77 y.o. 04/13/2018   Principle Diagnosis:  [name] - JAK2 (+)  Current Therapy:   Jakafi 58m PO BID   Interim History:  Amanda [name] here today with her husband for follow-up. She was hospitalized earlier this month for ischemic stroke and acute kidney injury. She is now on Plavix and home receiving PT twice a week.  She is fatigued and weak. She uses a walker for support when ambulating. She has had a couple falls but thankfully was not seriously injured.  She has occasional chills and has noted SOB with over exertion. She will take breaks to sit and rest as needed.  She is bruising easily with the Plavix but has not noted any bleeding. No petechiae noted on exam.  Her WBC count is now 29.8, Hgb 9.1 and platelet count 933. Creatinine is now 1.30 and BUN 25.  She is now on Trulicity and her blood sugars are now very well controlled. Her weight is down 4 lbs since her last visit.  She has a good appetite and is staying well hydrated.  No fever, n/v, cough, rash, dizziness, chest pain, palpitations, abdominal pain or changes in bowel or bladder habits.  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 The swelling in her lower extremities has resolved. No c/o or tenderness.  No lymphadenopathy noted on exam.   ECOG Performance Status: 2 - Symptomatic, <50% confined to bed  Medications:  Allergies as of 04/13/2018      Reactions   Lisinopril Cough   Tape Hives   Doxycycline Hives, Swelling, Rash   Latex Hives, Itching, Rash      Medication List        Accurate as of 04/13/18  9:24 AM. Always use your most recent med list.          ACCU-CHEK GUIDE w/Device Kit 1 kit by Does not apply route as directed.   amLODipine 10 MG tablet Commonly known as:  NORVASC Take 1 tablet (10 mg total) by mouth daily.   aspirin 325 MG tablet Take 1 tablet (325 mg total) by mouth daily.   clopidogrel 75 MG  tablet Commonly known as:  PLAVIX Take 1 tablet (75 mg total) by mouth daily.   diphenoxylate-atropine 2.5-0.025 MG tablet Commonly known as:  LOMOTIL Take 1-2 tablets by mouth 2 (two) times daily as needed for diarrhea or loose stools.   gabapentin 100 MG capsule Commonly known as:  NEURONTIN Take 1 capsule (100 mg total) by mouth 2 (two) times daily.   glucose blood test strip Commonly known as:  ACCU-CHEK GUIDE Use to check blood sugars 3 times daily   lipase/protease/amylase 36000 UNITS Cpep capsule Commonly known as:  CREON Take 1 capsule (36,000 Units total) by mouth 3 (three) times daily with meals.   loperamide 2 MG tablet Commonly known as:  IMODIUM A-D Take 1-2 tablets (2-4 mg total) by mouth daily as needed for diarrhea or loose stools.   ondansetron 4 MG disintegrating tablet Commonly known as:  ZOFRAN ODT Take 1 tablet (4 mg total) by mouth every 8 (eight) hours as needed.   promethazine 25 MG tablet Commonly known as:  PHENERGAN Take 0.5 tablets (12.5 mg total) by mouth every 6 (six) hours as needed for nausea or vomiting.   rosuvastatin 20 MG tablet Commonly known as:  CRESTOR TAKE 1 TABLET(20 MG) BY MOUTH DAILY   ruxolitinib phosphate 25 MG tablet Commonly known  as:  JAKAFI Take 1 tablet (25 mg total) by mouth 2 (two) times daily.   triamcinolone ointment 0.5 % Commonly known as:  KENALOG Apply 1 application topically 2 (two) times daily.       Allergies:  Allergies  Allergen Reactions  . Lisinopril Cough  . Tape Hives  . Doxycycline Hives, Swelling and Rash  . Latex Hives, Itching and Rash    Past Medical History, Surgical history, Social history, and Family History were reviewed and updated.  Review of Systems: All other 10 point review of systems is negative.   Physical Exam:  vitals were not taken for this visit.   Wt Readings from Last 3 Encounters:  04/05/18 209 lb 3.2 oz (94.9 kg)  04/04/18 209 lb (94.8 kg)  03/23/18 221 lb 5.5  oz (100.4 kg)    Ocular: Sclerae unicteric, pupils equal, round and reactive to light Ear-nose-throat: Oropharynx clear, dentition fair Lymphatic: No cervical, supraclavicular or axillary adenopathy Lungs no rales or rhonchi, good excursion bilaterally Heart regular rate and rhythm, no murmur appreciated Abd soft, nontender, positive bowel sounds, no liver or spleen tip palpated on exam, no fluid wave  MSK no focal spinal tenderness, no joint edema Neuro: non-focal, well-oriented, appropriate affect Breasts: Deferred   Lab Results  Component Value Date   WBC 29.8 (H) 04/13/2018   HGB 9.1 (L) 04/13/2018   HCT 28.6 (L) 04/13/2018   MCV 95.7 04/13/2018   PLT 933 (H) 04/13/2018   Lab Results  Component Value Date   FERRITIN 169 03/14/2018   IRON 82 03/14/2018   TIBC 250 03/14/2018   UIBC 168 03/14/2018   IRONPCTSAT 33 03/14/2018   Lab Results  Component Value Date   RETICCTPCT 2.4 01/30/2014   RBC 2.99 (L) 04/13/2018   No results found for: Nils Pyle [facility] Lab Results  Component Value Date   IGA 233 03/29/2014   No results found for: Kathrynn Ducking, MSPIKE, SPEI   Chemistry      Component Value Date/Time   NA 145 03/26/2018 0514   NA 140 10/12/2017 1013   NA 138 01/21/2017 0806   K 4.7 03/26/2018 0514   K 3.7 10/12/2017 1013   K 4.1 01/21/2017 0806   CL 112 (H) 03/26/2018 0514   CL 103 10/12/2017 1013   CL 103 02/16/2013 0900   CO2 24 03/26/2018 0514   CO2 25 10/12/2017 1013   CO2 21 (L) 01/21/2017 0806   BUN 26 (H) 03/26/2018 0514   BUN 10 10/12/2017 1013   BUN 12.2 01/21/2017 0806   CREATININE 1.46 (H) 03/26/2018 0514   CREATININE 1.00 03/14/2018 0917   CREATININE 0.9 10/12/2017 1013   CREATININE 1.0 01/21/2017 0806      Component Value Date/Time   CALCIUM 8.4 (L) 03/26/2018 0514   CALCIUM 8.7 10/12/2017 1013   CALCIUM 9.3 01/21/2017 0806   ALKPHOS 92 03/26/2018 0514   ALKPHOS 97 (H)  10/12/2017 1013   ALKPHOS 112 01/21/2017 0806   AST 25 03/26/2018 0514   AST 27 03/14/2018 0917   AST 37 (H) 01/21/2017 0806   ALT 11 (L) 03/26/2018 0514   ALT 21 03/14/2018 0917   ALT 25 10/12/2017 1013   ALT 16 01/21/2017 0806   BILITOT 1.2 03/26/2018 0514   BILITOT 1.4 03/14/2018 0917   BILITOT 0.89 01/21/2017 0806      Impression and Plan: Amanda [name] is a very pleasant 77 yo caucasian female with polycythemia vera, JAK-2 positive.  She is currently on Jakafi 25 mg PO BID and verbalized that she is taking as prescribed. Her WBC and platelet counts have gone up significantly and Hgb is down at 9.1.   I spoke with Dr. [name] and we will discontinue the [facility] and try her on Hydrea again (she was previously on this in 2015-2016). He will place this order. I have discontinued the Jakafi from her med list. I spoke with her husband about this change and he verbalized understanding.  We will plan to see her back in another 6 weeks for follow-up and repeat lab work.  They will contact our office with any questions or concerns. We can certainly see her sooner if need be.   Laverna Peace, NP 6/27/20199:24 AM

## 2018-04-14 ENCOUNTER — Telehealth: Payer: Self-pay | Admitting: Adult Health

## 2018-04-14 ENCOUNTER — Other Ambulatory Visit: Payer: Self-pay

## 2018-04-14 NOTE — Telephone Encounter (Signed)
Copied from [place] [id number]. Topic: Inquiry >> Apr 14, 2018  1:46 PM Lennox Solders wrote: Reason for CRM:ron taylor social worker with  [facility] is calling he will see patient for social worker visit on 04-18-18. Ron did not get the order until 6-27 on his device

## 2018-04-14 NOTE — Patient Outreach (Signed)
[facility]) Care Management  04/14/2018  MARTHA ELLERBY [birth year] [id number]    Transition of care  Referral date: 04/06/18 Referral source: discharged from an inpatient admission from [facility] [place] on 04/01/2018. Insurance: Health team advantage Attempt #1  Telephone call to patient regarding transition of care referral. Unable to reach patient. HIPAA compliant voice message left with call back phone number.   PLAN: RNCM will attempt 2nd telephone call to patient within 4 business days. RNCM will send outreach letter.   Quinn Plowman RN,BSN,CCM [facility] Telephonic  [phone]

## 2018-04-18 DIAGNOSIS — Z79899 Other long term (current) drug therapy: Secondary | ICD-10-CM | POA: Diagnosis not present

## 2018-04-18 DIAGNOSIS — Z794 Long term (current) use of insulin: Secondary | ICD-10-CM | POA: Diagnosis not present

## 2018-04-18 DIAGNOSIS — E1122 Type 2 diabetes mellitus with diabetic chronic kidney disease: Secondary | ICD-10-CM | POA: Diagnosis not present

## 2018-04-18 DIAGNOSIS — Z955 Presence of coronary angioplasty implant and graft: Secondary | ICD-10-CM | POA: Diagnosis not present

## 2018-04-18 DIAGNOSIS — Z6835 Body mass index (BMI) 35.0-35.9, adult: Secondary | ICD-10-CM | POA: Diagnosis not present

## 2018-04-18 DIAGNOSIS — E669 Obesity, unspecified: Secondary | ICD-10-CM | POA: Diagnosis not present

## 2018-04-18 DIAGNOSIS — I5032 Chronic diastolic (congestive) heart failure: Secondary | ICD-10-CM | POA: Diagnosis not present

## 2018-04-18 DIAGNOSIS — M6281 Muscle weakness (generalized): Secondary | ICD-10-CM | POA: Diagnosis not present

## 2018-04-18 DIAGNOSIS — I2510 Atherosclerotic heart disease of native coronary artery without angina pectoris: Secondary | ICD-10-CM | POA: Diagnosis not present

## 2018-04-18 DIAGNOSIS — I69398 Other sequelae of cerebral infarction: Secondary | ICD-10-CM | POA: Diagnosis not present

## 2018-04-18 DIAGNOSIS — N189 Chronic kidney disease, unspecified: Secondary | ICD-10-CM | POA: Diagnosis not present

## 2018-04-18 DIAGNOSIS — E1142 Type 2 diabetes mellitus with diabetic polyneuropathy: Secondary | ICD-10-CM | POA: Diagnosis not present

## 2018-04-18 DIAGNOSIS — Z7902 Long term (current) use of antithrombotics/antiplatelets: Secondary | ICD-10-CM | POA: Diagnosis not present

## 2018-04-18 DIAGNOSIS — K7460 Unspecified cirrhosis of liver: Secondary | ICD-10-CM | POA: Diagnosis not present

## 2018-04-18 DIAGNOSIS — I130 Hypertensive heart and chronic kidney disease with heart failure and stage 1 through stage 4 chronic kidney disease, or unspecified chronic kidney disease: Secondary | ICD-10-CM | POA: Diagnosis not present

## 2018-04-18 DIAGNOSIS — F419 Anxiety disorder, unspecified: Secondary | ICD-10-CM | POA: Diagnosis not present

## 2018-04-18 DIAGNOSIS — D45 Polycythemia vera: Secondary | ICD-10-CM | POA: Diagnosis not present

## 2018-04-18 DIAGNOSIS — F17211 Nicotine dependence, cigarettes, in remission: Secondary | ICD-10-CM | POA: Diagnosis not present

## 2018-04-18 DIAGNOSIS — F329 Major depressive disorder, single episode, unspecified: Secondary | ICD-10-CM | POA: Diagnosis not present

## 2018-04-18 NOTE — Telephone Encounter (Signed)
Noted  

## 2018-04-19 ENCOUNTER — Ambulatory Visit: Payer: Self-pay

## 2018-04-19 ENCOUNTER — Telehealth: Payer: Self-pay

## 2018-04-19 NOTE — Telephone Encounter (Signed)
Received call from pt's husband stating that since changing from [facility] to [facility], pt is having "severe diarrhea." [name] states "We think this is why she had to stop Hydrea before." Per Judson Roch, NP hold Hydrea for now. Judson Roch will discuss with Dr [name] upon his return to the office on 7/8. Bob aware and repeats back instructions. Verbalizes importance of pushing fluids and has administered Imodium to pt. dph

## 2018-04-21 ENCOUNTER — Other Ambulatory Visit: Payer: Self-pay

## 2018-04-21 NOTE — Patient Outreach (Signed)
[facility]) Care Management  04/21/2018  VELIA PAMER 1941-02-25 [id number]  Transition of care  Referral date: 04/06/18 Referral source: discharged from an inpatient admission from [facility] and [place] on 04/01/2018. Insurance: Health team advantage Attempt #2  Telephone call to patient regarding transition of care follow up. Unable to reach. HIPAA compliant voice message left with call back phone number.   PLAN: RNCM will attempt 3rd telephone call  Within 4 business days.   Quinn Plowman RN,BSN,CCM [facility] Telephonic  [phone]

## 2018-04-24 ENCOUNTER — Telehealth: Payer: Self-pay

## 2018-04-24 ENCOUNTER — Telehealth: Payer: Self-pay | Admitting: Adult Health

## 2018-04-24 NOTE — Telephone Encounter (Signed)
Called pt to discuss Dr. Cruzita Lederer reviewing her sugar logs she does want her to continue the same regiman. Pts husband is aware

## 2018-04-24 NOTE — Telephone Encounter (Signed)
Copied from [place] [id number]. Topic: Quick Communication - See Telephone Encounter >> Apr 24, 2018  9:52 AM Neva Seat wrote: [place] [phone]  Verbal Orders: Additional Social work for 1 time a week for 2 weeks

## 2018-04-25 ENCOUNTER — Telehealth: Payer: Self-pay | Admitting: Family

## 2018-04-25 ENCOUNTER — Other Ambulatory Visit: Payer: Self-pay

## 2018-04-25 NOTE — Telephone Encounter (Signed)
I spoke with both the patient and her husband and let them know that she can hold the [facility] for now and take a break until her next follow-up the first week of August. She states that the diarrhea has resolved and she is resting. We will re-evaluate labs at her next visit and go from there. She is in agreement with the plan.

## 2018-04-25 NOTE — Telephone Encounter (Signed)
Left a verbal order to continue social work.  Call back if needed.  No further action required.

## 2018-04-25 NOTE — Patient Outreach (Signed)
[facility]) Care Management  04/25/2018  Amanda [name] July 29, 1941 [id number]    Transition of care  Referral date:04/06/18 Referral source:discharged from an inpatient admission from [facility] on 04/01/2018. Insurance:Health team advantage Attempt #3  Telephone call to patient regarding transition of care follow up. Unable to reach. HIPAA compliant voice message left with call back phone number.   PLAN: If no return call will proceed with case closure  Quinn Plowman RN,BSN,CCM [facility] Telephonic  [phone]

## 2018-04-26 ENCOUNTER — Other Ambulatory Visit: Payer: Self-pay

## 2018-04-26 NOTE — Patient Outreach (Signed)
[facility]) Care Management  04/26/2018   STARLETTE THUROW [birth year] [id number]  Initial assessment:  Subjective: Telephone call to patient for initial assessment.  HIPAA verified with patient. Explained reason for call.  Patient states she has a follow up appointment with her primary MD on 05/11/18 and a follow up with her oncologist on 05/23/18. Patient states she continues to have home health physical therapy and nurse follow up. Patient reports her doctor discontinued her plavix.  Patient unable to states 2 signs/ symptoms of stroke. She states she had physical therapy today and is tired. RNCM reviewed signs/ symptoms of stroke with patient.   Objective: see assessment  Current Medications:  Current Outpatient Medications  Medication Sig Dispense Refill  . amLODipine (NORVASC) 10 MG tablet Take 1 tablet (10 mg total) by mouth daily. 90 tablet 1  . aspirin 325 MG tablet Take 1 tablet (325 mg total) by mouth daily. 30 tablet 0  . Blood Glucose Monitoring Suppl (ACCU-CHEK GUIDE) w/Device KIT 1 kit by Does not apply route as directed. 1 kit 0  . clopidogrel (PLAVIX) 75 MG tablet Take 1 tablet (75 mg total) by mouth daily. (Patient not taking: Reported on 04/26/2018) 90 tablet 1  . diphenoxylate-atropine (LOMOTIL) 2.5-0.025 MG tablet Take 1-2 tablets by mouth 2 (two) times daily as needed for diarrhea or loose stools. 60 tablet 0  . gabapentin (NEURONTIN) 100 MG capsule Take 1 capsule (100 mg total) by mouth 2 (two) times daily. 60 capsule 0  . glucose blood (ACCU-CHEK GUIDE) test strip Use to check blood sugars 3 times daily 100 each 12  . hydroxyurea (HYDREA) 500 MG capsule Take 1 capsule (500 mg total) by mouth 2 (two) times daily. May take with food to minimize GI side effects. 60 capsule 3  . lipase/protease/amylase (CREON) 36000 UNITS CPEP capsule Take 1 capsule (36,000 Units total) by mouth 3 (three) times daily with meals. 180 capsule 3  . loperamide (IMODIUM A-D) 2 MG  tablet Take 1-2 tablets (2-4 mg total) by mouth daily as needed for diarrhea or loose stools. 30 tablet 1  . ondansetron (ZOFRAN ODT) 4 MG disintegrating tablet Take 1 tablet (4 mg total) by mouth every 8 (eight) hours as needed. (Patient taking differently: Take 4 mg by mouth every 8 (eight) hours as needed for nausea or vomiting. ) 10 tablet 0  . promethazine (PHENERGAN) 25 MG tablet Take 0.5 tablets (12.5 mg total) by mouth every 6 (six) hours as needed for nausea or vomiting. 30 tablet 3  . rosuvastatin (CRESTOR) 20 MG tablet TAKE 1 TABLET(20 MG) BY MOUTH DAILY 90 tablet 3  . triamcinolone ointment (KENALOG) 0.5 % Apply 1 application topically 2 (two) times daily. 30 g 0   No current facility-administered medications for this visit.     Functional Status:  In your present state of health, do you have any difficulty performing the following activities: 04/26/2018 03/23/2018  Hearing? N Y  Vision? N N  Difficulty concentrating or making decisions? N N  Walking or climbing stairs? Y Y  Comment - "get out of breath"  Dressing or bathing? N N  Doing errands, shopping? Y Y  Comment - "I've not been drivingChief Executive Officer and eating ? Y -  Using the Toilet? N -  In the past six months, have you accidently leaked urine? N -  Do you have problems with loss of bowel control? N -  Managing your Medications? N -  Managing your Finances?  N -  Housekeeping or managing your Housekeeping? Y -  Some recent data might be hidden    Fall/Depression Screening: Fall Risk  04/26/2018 06/14/2017 11/15/2016  Falls in the past year? Yes No No  Number falls in past yr: 1 - -  Injury with Fall? No - -  Risk for fall due to : History of fall(s) - -  Risk for fall due to: Comment patient states she fell due to having a stroke - -   PHQ 2/9 Scores 04/26/2018 06/14/2017 06/14/2017 02/14/2017 01/26/2016 09/04/2014  PHQ - 2 Score 3 2 0 0 0 2  PHQ- 9 Score 4 2 - 17 - 5   THN CM Care Plan Problem One     Most  Recent Value  Care Plan Problem One  Potential for readmission due to recent hospitalization  Role Documenting the Problem One  Care Management Telephonic [place] for Problem One  Active  THN Long Term Goal   patient will verbalize no hospital readmission within 31 days  THN Long Term Goal Start Date  04/06/18  [facility] Long Term Goal Met Date  04/26/18  THN CM Short Term Goal #1   Patient will verbalize 3 signs /symptoms of stroke within 30 days  THN CM Short Term Goal #1 Start Date  04/06/18  [facility] CM Short Term Goal #2   patient will reports she is taking her medication as prescribed by her doctor within 3 weeks.   THN CM Short Term Goal #2 Start Date  04/06/18  [facility] CM Short Term Goal #2 Met Date  04/26/18      Plan: RNCM will follow up with patient within  2 weeks.  RNCM will submit completed assessment to patients primary MD.   Quinn Plowman RN,BSN,CCM [facility] Telephonic  [phone]

## 2018-04-27 ENCOUNTER — Other Ambulatory Visit: Payer: Self-pay | Admitting: Internal Medicine

## 2018-04-27 ENCOUNTER — Telehealth: Payer: Self-pay | Admitting: Adult Health

## 2018-04-27 NOTE — Telephone Encounter (Signed)
Copied from [place] [phone] [phone]. Topic: Quick Communication - See Telephone Encounter >> Apr 27, 2018  3:49 PM Bea Graff, NT wrote: CRM for notification. See Telephone encounter for: 04/27/18. Beth with [facility] is calling to see if they can have another order to do a OT eval due to husband reporting since the first eval the pt is now having difficulty grasping things with her right hand. CB#: [phone]

## 2018-04-28 NOTE — Telephone Encounter (Signed)
Beth notified to proceed with OT.  No further action required.

## 2018-04-30 ENCOUNTER — Other Ambulatory Visit: Payer: Self-pay | Admitting: Internal Medicine

## 2018-05-03 ENCOUNTER — Telehealth: Payer: Self-pay | Admitting: Adult Health

## 2018-05-03 ENCOUNTER — Ambulatory Visit: Payer: Self-pay

## 2018-05-03 NOTE — Telephone Encounter (Signed)
Noted  

## 2018-05-03 NOTE — Telephone Encounter (Signed)
Copied from [place] [id number]. Topic: Quick Communication - See Telephone Encounter >> May 03, 2018  3:21 PM Vernona Rieger wrote: CRM for notification. See Telephone encounter for: 05/03/18.  Langley Gauss, RN from [facility] called to let Amanda [name] know that she is discontinuing skilled nursing & physical therapy will continue to see her. She is doing really well.

## 2018-05-08 ENCOUNTER — Other Ambulatory Visit: Payer: Self-pay

## 2018-05-08 NOTE — Patient Outreach (Signed)
[facility]) Care Management  05/08/2018  JASIME WESTERGREN 10/10/1941 [id number]   Telephone assessment:  Telephone call to patient regarding referral. Unable to reach patient. HIPAA compliant voice message left with call back phone number.   PLAN: RNCM will attempt 2nd telephone call to patient within 4 business days. RNCM will send outreach letter.   Quinn Plowman RN,BSN,CCM [facility] Telephonic  [phone]

## 2018-05-10 ENCOUNTER — Telehealth: Payer: Self-pay | Admitting: Family Medicine

## 2018-05-10 ENCOUNTER — Other Ambulatory Visit: Payer: Self-pay

## 2018-05-10 NOTE — Telephone Encounter (Signed)
Copied from [place] [id number]. Topic: General - Other >> May 10, 2018  2:26 PM Alfredia Ferguson R wrote: Sharyn Lull from [facility] OT is calling in stating no major weakness in the right and pt denies need for OT at this time  Cb# [phone]

## 2018-05-10 NOTE — Telephone Encounter (Signed)
Noted  

## 2018-05-10 NOTE — Patient Outreach (Signed)
[facility]) Care Management  05/10/2018  TERENCE GOOGE 12/20/1940 [id number] Attempt #3  Telephone assessment:   Telephone call to patient regarding assessment follow up.  Patient states, " I am terribly sick, call back at another time." Patient then hung up the phone.   PLAN; RNCM will attempt 3rd telephone call to patient within 4 business days.   Quinn Plowman RN,BSN,CCM [facility] Telephonic  [phone]

## 2018-05-11 ENCOUNTER — Other Ambulatory Visit: Payer: Self-pay

## 2018-05-11 ENCOUNTER — Encounter: Payer: Self-pay | Admitting: Adult Health

## 2018-05-11 ENCOUNTER — Other Ambulatory Visit: Payer: Self-pay | Admitting: Adult Health

## 2018-05-11 ENCOUNTER — Ambulatory Visit (INDEPENDENT_AMBULATORY_CARE_PROVIDER_SITE_OTHER): Payer: PPO | Admitting: Adult Health

## 2018-05-11 VITALS — BP 146/56 | Temp 98.0°F | Wt 220.0 lb

## 2018-05-11 DIAGNOSIS — F329 Major depressive disorder, single episode, unspecified: Secondary | ICD-10-CM

## 2018-05-11 MED ORDER — MIRTAZAPINE 7.5 MG PO TABS: 7.5000 mg | ORAL_TABLET | Freq: Every day | ORAL | 1 refills | Status: DC

## 2018-05-11 NOTE — Patient Outreach (Addendum)
[facility]) Care Management  05/11/2018  KIRSTEIN BAXLEY 07/26/1941 [id number]  Telephone assessment:   Telephone call to patient regarding assessment follow up.  Unable to reach patient. HIPAA compliant voice message left with call back phone number.  PLAN:  If no return call from patient will proceed with case closure  Quinn Plowman RN,BSN,CCM [facility] Telephonic  [phone]

## 2018-05-11 NOTE — Progress Notes (Signed)
Subjective:    Patient ID: Amanda [name], female    DOB: 06-15-1941, 77 y.o.   MRN: [id number]  HPI 77 year old female who  has a past medical history of Allergic rhinitis (01/23/2016), C. difficile colitis, CAD (coronary artery disease), Cancer ([place]), Candidiasis of skin (09/30/2014), Cirrhosis ([place]), Depression, Diabetes mellitus (2008), Gout, Herpes simplex, Hyperglycemia (05/31/2013), Hyperlipidemia, Hypertension, Myocardial infarction [facility]), Neuromuscular disorder ([place]), Obesity, Polycythemia, and Psoriasis. Her husband is with her at this visit.   She presents to the office today for follow up s/p CVA; Punctate small vessel type acute ischemic nonhemorrhagic infarct involving the posterior right centrum semi ovale.  She reports "I am not doing well".  Per patient and patient's husband she is not leaving the bed throughout the day, is sleeping in the morning and afternoon and does not sleep at night.  Also reports depressed mood, and not eating well and overall feeling weak and tired.  Denies any suicidal ideation but dates "I just feel like I am done".  She continues with physical therapy for the next 2 weeks and has been seen by active patient of therapy but did not find this helpful and discontinued therapy.   Review of Systems See HPI   Past Medical History:  Diagnosis Date  . Allergic rhinitis 01/23/2016  . C. difficile colitis   . CAD (coronary artery disease)    a. s/p STEMI in 01/2015 with 95% LCx stenosis and distal 80% LCx stenosis (DESx2 placed)  . Cancer ([place])    SKIN  . Candidiasis of skin 09/30/2014  . Cirrhosis ([place])   . Depression   . Diabetes mellitus 2008  . Gout   . Herpes simplex   . Hyperglycemia 05/31/2013  . Hyperlipidemia   . Hypertension   . Myocardial infarction ([place])   . Neuromuscular disorder ([place])    BELL PALSY  . Obesity   . Polycythemia    Dr. Elease Hashimoto- [facility]  . Psoriasis     Social History   Socioeconomic History  . Marital status:  Married    Spouse name: Not on file  . Number of children: 2  . Years of education: 2 yr colle  . Highest education level: Not on file  Occupational History  . Occupation: housewife  Social Needs  . Financial resource strain: Not on file  . Food insecurity:    Worry: Not on file    Inability: Not on file  . Transportation needs:    Medical: Not on file    Non-medical: Not on file  Tobacco Use  . Smoking status: Former Smoker    Years: 48.00    Types: Cigarettes    Last attempt to quit: 10/18/1986    Years since quitting: 31.5  . Smokeless tobacco: Never Used  Substance and Sexual Activity  . Alcohol use: No    Alcohol/week: 0.0 oz  . Drug use: No  . Sexual activity: Yes    Partners: Male    Birth control/protection: None  Lifestyle  . Physical activity:    Days per week: Not on file    Minutes per session: Not on file  . Stress: Not on file  Relationships  . Social connections:    Talks on phone: Not on file    Gets together: Not on file    Attends religious service: Not on file    Active member of club or organization: Not on file    Attends meetings of clubs or organizations: Not on  file    Relationship status: Not on file  . Intimate partner violence:    Fear of current or ex partner: Not on file    Emotionally abused: Not on file    Physically abused: Not on file    Forced sexual activity: Not on file  Other Topics Concern  . Not on file  Social History Narrative   2 caffeine drink per day.  No regular exercise.     Retired from the bank   2 children (Daughter and a son) son has morbid obesity   2 grandchildren   3 great grandchildren    Past Surgical History:  Procedure Laterality Date  . BREAST SURGERY Left    milk duct  . CATARACT EXTRACTION, BILATERAL    . COLONOSCOPY    . LEFT HEART CATHETERIZATION WITH CORONARY ANGIOGRAM N/A 01/27/2015   Procedure: LEFT HEART CATHETERIZATION WITH CORONARY ANGIOGRAM;  Surgeon: [name], MD;  Location: [facility]  CATH LAB;  Service: Cardiovascular;  Laterality: N/A;  . TONSILLECTOMY    . TOTAL ABDOMINAL HYSTERECTOMY W/ BILATERAL SALPINGOOPHORECTOMY     for heavy periods with appendectomy    Family History  Problem Relation Age of Onset  . Diabetes Mother   . Heart disease Mother        CAD  . Hyperlipidemia Mother   . Hypertension Mother   . Kidney disease Mother   . Kidney disease Father   . Breast cancer Maternal Aunt   . Heart disease Maternal Grandmother   . Heart disease Other        maternal aunts and uncles    Allergies  Allergen Reactions  . Lisinopril Cough  . Tape Hives  . Doxycycline Hives, Swelling and Rash  . Latex Hives, Itching and Rash    Current Outpatient Medications on File Prior to Visit  Medication Sig Dispense Refill  . amLODipine (NORVASC) 10 MG tablet Take 1 tablet (10 mg total) by mouth daily. 90 tablet 1  . aspirin 325 MG tablet Take 1 tablet (325 mg total) by mouth daily. 30 tablet 0  . Blood Glucose Monitoring Suppl (ACCU-CHEK GUIDE) w/Device KIT 1 kit by Does not apply route as directed. 1 kit 0  . diphenoxylate-atropine (LOMOTIL) 2.5-0.025 MG tablet Take 1-2 tablets by mouth 2 (two) times daily as needed for diarrhea or loose stools. 60 tablet 0  . gabapentin (NEURONTIN) 100 MG capsule Take 1 capsule (100 mg total) by mouth 2 (two) times daily. 60 capsule 0  . glucose blood (ACCU-CHEK GUIDE) test strip Use to check blood sugars 3 times daily 100 each 12  . hydroxyurea (HYDREA) 500 MG capsule Take 1 capsule (500 mg total) by mouth 2 (two) times daily. May take with food to minimize GI side effects. 60 capsule 3  . Insulin Pen Needle (BD PEN NEEDLE NANO U/F) 32G X 4 MM MISC USE TO INJECT INSULIN AS DIRECTED. 100 each 0  . lipase/protease/amylase (CREON) 36000 UNITS CPEP capsule Take 1 capsule (36,000 Units total) by mouth 3 (three) times daily with meals. 180 capsule 3  . loperamide (IMODIUM A-D) 2 MG tablet Take 1-2 tablets (2-4 mg total) by mouth daily as  needed for diarrhea or loose stools. 30 tablet 1  . ondansetron (ZOFRAN ODT) 4 MG disintegrating tablet Take 1 tablet (4 mg total) by mouth every 8 (eight) hours as needed. (Patient taking differently: Take 4 mg by mouth every 8 (eight) hours as needed for nausea or vomiting. ) 10 tablet 0  .  ONETOUCH VERIO test strip USE TO TEST BLOOD SUGAR THREE TIMES DAILY 300 each 2  . promethazine (PHENERGAN) 25 MG tablet Take 0.5 tablets (12.5 mg total) by mouth every 6 (six) hours as needed for nausea or vomiting. 30 tablet 3  . rosuvastatin (CRESTOR) 20 MG tablet TAKE 1 TABLET(20 MG) BY MOUTH DAILY 90 tablet 3  . triamcinolone ointment (KENALOG) 0.5 % Apply 1 application topically 2 (two) times daily. 30 g 0   No current facility-administered medications on file prior to visit.     BP (!) 146/56   Temp 98 F (36.7 C) (Oral)   Wt 220 lb (99.8 kg)   BMI 37.76 kg/m       Objective:   Physical Exam  Constitutional: She is oriented to person, place, and time. She appears well-developed and well-nourished. No distress.  Tearful during much of  exam   Eyes: Pupils are equal, round, and reactive to light. EOM are normal.  Cardiovascular: Normal rate and regular rhythm.  Pulmonary/Chest: Effort normal and breath sounds normal.  Musculoskeletal: She exhibits no edema, tenderness or deformity.  Using rolling walker with seat   Neurological: She is alert and oriented to person, place, and time. She exhibits abnormal muscle tone. Coordination abnormal.  Skin: Skin is warm and dry. She is not diaphoretic.  Psychiatric: Her speech is normal. Judgment and thought content normal. She is slowed. Cognition and memory are normal. She exhibits a depressed mood.  Vitals reviewed.     Assessment & Plan:  1. Reactive depression -We depressed in the office today, and tearful through much of the exam.  This is likely stemming from her recent CVA.  Patient feels as well as I do that it is time for her to start some  type of medication therapy.  She does not want to be seen by psychiatry at this point in time.  Reviewed possible medications and side effects and ultimately decided on using Remeron.  Advised her that I did not want her to be in bed except for sleeping and at night.  During the day she needs to be up either working on exercises or she can rest on the couch.  Have her follow-up in 1 month or sooner if needed.  She was advised,and husband was educated on, if she develops any suicidal ideation that she is to stop the medication and go to the emergency room - mirtazapine (REMERON) 7.5 MG tablet; Take 1 tablet (7.5 mg total) by mouth at bedtime.  Dispense: 30 tablet; Refill: 1  [name], NP

## 2018-05-12 ENCOUNTER — Other Ambulatory Visit: Payer: Self-pay

## 2018-05-12 ENCOUNTER — Telehealth: Payer: Self-pay | Admitting: Internal Medicine

## 2018-05-12 MED ORDER — ACCU-CHEK FASTCLIX LANCETS MISC: 1 refills | Status: DC

## 2018-05-12 NOTE — Telephone Encounter (Signed)
Sent!

## 2018-05-12 NOTE — Telephone Encounter (Signed)
Patient is needing a prescription sent in for th ACCU CHECK Fast Click Lancets      [place], [place]

## 2018-05-12 NOTE — Patient Outreach (Signed)
Amanda [name] [facility]) Care Management  05/12/2018  Amanda [name] 1941/08/23 [id number]    Telephone assessment:  Attempt #4  Telephone call to patient regarding assessment follow up.  Unable to reach patient. HIPAA compliant voice message left with call back phone number.  PLAN:  If no return call from patient will proceed with case closure  Quinn Plowman RN,BSN,CCM [facility] Telephonic  [phone]

## 2018-05-15 ENCOUNTER — Telehealth: Payer: Self-pay | Admitting: Adult Health

## 2018-05-15 NOTE — Telephone Encounter (Signed)
Copied from [place] [id number]. Topic: General - Other >> May 15, 2018  8:58 AM Cecelia Byars, NT wrote: Reason for CRM: Patients husband called and said he would like a call from [place] or his nurse  concerning his wife and that  she is having problems with the new medication and he has other issues to discuss with him .please call him at [phone]

## 2018-05-15 NOTE — Telephone Encounter (Signed)
I spoke with pt, this message can wait until Tommi Rumps returns to the office on 05/16/2018. I called to make sure that this was nothing urgent.

## 2018-05-16 NOTE — Telephone Encounter (Signed)
Spoke to the pt and informed her the Remeron should not have caused diarrhea.  Advised to try OTC Metamucil or eat prunes for her constipation.  Pt will call back as needed.  Nothing further needed.

## 2018-05-16 NOTE — Telephone Encounter (Signed)
Spoke to the pt.  She said she had terrible diarrhea on Friday through to Saturday.  She is not sure if she had a virus or if the new Remeron caused it.    She then took 2 Lomotil pills.  Has not had any diarrhea since but now she is constipated.  She has not tried any OTC metamucil, miralax etc.  She does not have any nausea, vomiting or abdominal pain.  Please advise.

## 2018-05-16 NOTE — Telephone Encounter (Signed)
Patient husband returning call

## 2018-05-16 NOTE — Telephone Encounter (Signed)
Left a message for a return call.

## 2018-05-16 NOTE — Telephone Encounter (Signed)
Remeron should not cause diarrhea.   Ok to use metamucil or eat prunes

## 2018-05-18 DIAGNOSIS — F17211 Nicotine dependence, cigarettes, in remission: Secondary | ICD-10-CM | POA: Diagnosis not present

## 2018-05-18 DIAGNOSIS — F419 Anxiety disorder, unspecified: Secondary | ICD-10-CM | POA: Diagnosis not present

## 2018-05-18 DIAGNOSIS — M6281 Muscle weakness (generalized): Secondary | ICD-10-CM | POA: Diagnosis not present

## 2018-05-18 DIAGNOSIS — I130 Hypertensive heart and chronic kidney disease with heart failure and stage 1 through stage 4 chronic kidney disease, or unspecified chronic kidney disease: Secondary | ICD-10-CM | POA: Diagnosis not present

## 2018-05-18 DIAGNOSIS — I2510 Atherosclerotic heart disease of native coronary artery without angina pectoris: Secondary | ICD-10-CM | POA: Diagnosis not present

## 2018-05-18 DIAGNOSIS — F329 Major depressive disorder, single episode, unspecified: Secondary | ICD-10-CM | POA: Diagnosis not present

## 2018-05-18 DIAGNOSIS — E669 Obesity, unspecified: Secondary | ICD-10-CM | POA: Diagnosis not present

## 2018-05-18 DIAGNOSIS — Z79899 Other long term (current) drug therapy: Secondary | ICD-10-CM | POA: Diagnosis not present

## 2018-05-18 DIAGNOSIS — N189 Chronic kidney disease, unspecified: Secondary | ICD-10-CM | POA: Diagnosis not present

## 2018-05-18 DIAGNOSIS — K7460 Unspecified cirrhosis of liver: Secondary | ICD-10-CM | POA: Diagnosis not present

## 2018-05-18 DIAGNOSIS — Z7902 Long term (current) use of antithrombotics/antiplatelets: Secondary | ICD-10-CM | POA: Diagnosis not present

## 2018-05-18 DIAGNOSIS — D45 Polycythemia vera: Secondary | ICD-10-CM | POA: Diagnosis not present

## 2018-05-18 DIAGNOSIS — I5032 Chronic diastolic (congestive) heart failure: Secondary | ICD-10-CM | POA: Diagnosis not present

## 2018-05-18 DIAGNOSIS — Z794 Long term (current) use of insulin: Secondary | ICD-10-CM | POA: Diagnosis not present

## 2018-05-18 DIAGNOSIS — E1142 Type 2 diabetes mellitus with diabetic polyneuropathy: Secondary | ICD-10-CM | POA: Diagnosis not present

## 2018-05-18 DIAGNOSIS — Z955 Presence of coronary angioplasty implant and graft: Secondary | ICD-10-CM | POA: Diagnosis not present

## 2018-05-18 DIAGNOSIS — Z6835 Body mass index (BMI) 35.0-35.9, adult: Secondary | ICD-10-CM | POA: Diagnosis not present

## 2018-05-18 DIAGNOSIS — E1122 Type 2 diabetes mellitus with diabetic chronic kidney disease: Secondary | ICD-10-CM | POA: Diagnosis not present

## 2018-05-18 DIAGNOSIS — I69398 Other sequelae of cerebral infarction: Secondary | ICD-10-CM | POA: Diagnosis not present

## 2018-05-19 ENCOUNTER — Telehealth: Payer: Self-pay | Admitting: Adult Health

## 2018-05-19 NOTE — Telephone Encounter (Signed)
Copied from [place] [id number]. Topic: General - Other >> May 19, 2018  2:08 PM Keene Breath wrote: Reason for CRM: Monique with [facility] called to extend PT for 2 more weeks,  Patient needs strengthening and gait balance training.  Please advise.  CB# [phone].

## 2018-05-19 NOTE — Telephone Encounter (Signed)
Ok for verbal orders ?

## 2018-05-19 NOTE — Telephone Encounter (Signed)
I left a voice message with okay for verbal orders.

## 2018-05-22 ENCOUNTER — Other Ambulatory Visit: Payer: Self-pay

## 2018-05-22 DIAGNOSIS — D45 Polycythemia vera: Secondary | ICD-10-CM

## 2018-05-22 NOTE — Patient Outreach (Signed)
[facility]) Care Management  05/22/2018  Amanda [name] 17-Mar-1941 [id number]  Case Closure:  No response after 3 telephone calls and outreach letter attempt.  PLAN: RNCM will close patient due to being unable to reach.  RNCM will send closure notification to patient's primary MD  RNCM will send closure notification to patient.   Quinn Plowman RN,BSN,CCM [facility] Telephonic  [phone]

## 2018-05-23 ENCOUNTER — Emergency Department (HOSPITAL_COMMUNITY): Payer: PPO

## 2018-05-23 ENCOUNTER — Telehealth: Payer: Self-pay | Admitting: *Deleted

## 2018-05-23 ENCOUNTER — Inpatient Hospital Stay: Payer: PPO | Admitting: Family

## 2018-05-23 ENCOUNTER — Inpatient Hospital Stay (HOSPITAL_COMMUNITY)
Admission: EM | Admit: 2018-05-23 | Discharge: 2018-05-28 | DRG: 812 | Disposition: A | Discharge status: Home / Self Care | Payer: PPO | Attending: Internal Medicine | Admitting: Internal Medicine

## 2018-05-23 ENCOUNTER — Encounter (HOSPITAL_COMMUNITY): Payer: Self-pay

## 2018-05-23 ENCOUNTER — Inpatient Hospital Stay: Payer: PPO

## 2018-05-23 ENCOUNTER — Other Ambulatory Visit: Payer: Self-pay

## 2018-05-23 DIAGNOSIS — Z833 Family history of diabetes mellitus: Secondary | ICD-10-CM

## 2018-05-23 DIAGNOSIS — Z91040 Latex allergy status: Secondary | ICD-10-CM

## 2018-05-23 DIAGNOSIS — R195 Other fecal abnormalities: Secondary | ICD-10-CM | POA: Diagnosis not present

## 2018-05-23 DIAGNOSIS — I1310 Hypertensive heart and chronic kidney disease without heart failure, with stage 1 through stage 4 chronic kidney disease, or unspecified chronic kidney disease: Secondary | ICD-10-CM | POA: Diagnosis present

## 2018-05-23 DIAGNOSIS — K5730 Diverticulosis of large intestine without perforation or abscess without bleeding: Secondary | ICD-10-CM | POA: Diagnosis present

## 2018-05-23 DIAGNOSIS — Z8673 Personal history of transient ischemic attack (TIA), and cerebral infarction without residual deficits: Secondary | ICD-10-CM | POA: Diagnosis present

## 2018-05-23 DIAGNOSIS — D62 Acute posthemorrhagic anemia: Principal | ICD-10-CM | POA: Diagnosis present

## 2018-05-23 DIAGNOSIS — K5781 Diverticulitis of intestine, part unspecified, with perforation and abscess with bleeding: Secondary | ICD-10-CM | POA: Diagnosis not present

## 2018-05-23 DIAGNOSIS — J811 Chronic pulmonary edema: Secondary | ICD-10-CM | POA: Diagnosis present

## 2018-05-23 DIAGNOSIS — K7581 Nonalcoholic steatohepatitis (NASH): Secondary | ICD-10-CM | POA: Diagnosis present

## 2018-05-23 DIAGNOSIS — K2970 Gastritis, unspecified, without bleeding: Secondary | ICD-10-CM | POA: Diagnosis not present

## 2018-05-23 DIAGNOSIS — D473 Essential (hemorrhagic) thrombocythemia: Secondary | ICD-10-CM | POA: Diagnosis present

## 2018-05-23 DIAGNOSIS — K529 Noninfective gastroenteritis and colitis, unspecified: Secondary | ICD-10-CM | POA: Diagnosis not present

## 2018-05-23 DIAGNOSIS — D509 Iron deficiency anemia, unspecified: Secondary | ICD-10-CM | POA: Diagnosis present

## 2018-05-23 DIAGNOSIS — I2510 Atherosclerotic heart disease of native coronary artery without angina pectoris: Secondary | ICD-10-CM | POA: Diagnosis present

## 2018-05-23 DIAGNOSIS — E1122 Type 2 diabetes mellitus with diabetic chronic kidney disease: Secondary | ICD-10-CM | POA: Diagnosis present

## 2018-05-23 DIAGNOSIS — D649 Anemia, unspecified: Secondary | ICD-10-CM | POA: Diagnosis not present

## 2018-05-23 DIAGNOSIS — D45 Polycythemia vera: Secondary | ICD-10-CM | POA: Diagnosis present

## 2018-05-23 DIAGNOSIS — E1165 Type 2 diabetes mellitus with hyperglycemia: Secondary | ICD-10-CM

## 2018-05-23 DIAGNOSIS — D732 Chronic congestive splenomegaly: Secondary | ICD-10-CM | POA: Diagnosis not present

## 2018-05-23 DIAGNOSIS — I10 Essential (primary) hypertension: Secondary | ICD-10-CM | POA: Diagnosis present

## 2018-05-23 DIAGNOSIS — R188 Other ascites: Secondary | ICD-10-CM

## 2018-05-23 DIAGNOSIS — E785 Hyperlipidemia, unspecified: Secondary | ICD-10-CM | POA: Diagnosis not present

## 2018-05-23 DIAGNOSIS — Z87891 Personal history of nicotine dependence: Secondary | ICD-10-CM

## 2018-05-23 DIAGNOSIS — K766 Portal hypertension: Secondary | ICD-10-CM | POA: Diagnosis present

## 2018-05-23 DIAGNOSIS — K7460 Unspecified cirrhosis of liver: Secondary | ICD-10-CM

## 2018-05-23 DIAGNOSIS — Z8349 Family history of other endocrine, nutritional and metabolic diseases: Secondary | ICD-10-CM

## 2018-05-23 DIAGNOSIS — F329 Major depressive disorder, single episode, unspecified: Secondary | ICD-10-CM | POA: Diagnosis not present

## 2018-05-23 DIAGNOSIS — L408 Other psoriasis: Secondary | ICD-10-CM | POA: Diagnosis present

## 2018-05-23 DIAGNOSIS — R3129 Other microscopic hematuria: Secondary | ICD-10-CM | POA: Diagnosis present

## 2018-05-23 DIAGNOSIS — Z7982 Long term (current) use of aspirin: Secondary | ICD-10-CM

## 2018-05-23 DIAGNOSIS — L249 Irritant contact dermatitis, unspecified cause: Secondary | ICD-10-CM

## 2018-05-23 DIAGNOSIS — Z90710 Acquired absence of both cervix and uterus: Secondary | ICD-10-CM

## 2018-05-23 DIAGNOSIS — N183 Chronic kidney disease, stage 3 (moderate): Secondary | ICD-10-CM | POA: Diagnosis not present

## 2018-05-23 DIAGNOSIS — R52 Pain, unspecified: Secondary | ICD-10-CM | POA: Diagnosis not present

## 2018-05-23 DIAGNOSIS — I519 Heart disease, unspecified: Secondary | ICD-10-CM | POA: Diagnosis not present

## 2018-05-23 DIAGNOSIS — Z6836 Body mass index (BMI) 36.0-36.9, adult: Secondary | ICD-10-CM

## 2018-05-23 DIAGNOSIS — Z79899 Other long term (current) drug therapy: Secondary | ICD-10-CM

## 2018-05-23 DIAGNOSIS — Z66 Do not resuscitate: Secondary | ICD-10-CM | POA: Diagnosis present

## 2018-05-23 DIAGNOSIS — R41841 Cognitive communication deficit: Secondary | ICD-10-CM | POA: Diagnosis not present

## 2018-05-23 DIAGNOSIS — G4733 Obstructive sleep apnea (adult) (pediatric): Secondary | ICD-10-CM | POA: Diagnosis not present

## 2018-05-23 DIAGNOSIS — B370 Candidal stomatitis: Secondary | ICD-10-CM | POA: Diagnosis not present

## 2018-05-23 DIAGNOSIS — K2950 Unspecified chronic gastritis without bleeding: Secondary | ICD-10-CM | POA: Diagnosis not present

## 2018-05-23 DIAGNOSIS — F419 Anxiety disorder, unspecified: Secondary | ICD-10-CM | POA: Diagnosis present

## 2018-05-23 DIAGNOSIS — R109 Unspecified abdominal pain: Secondary | ICD-10-CM | POA: Diagnosis not present

## 2018-05-23 DIAGNOSIS — D75839 Thrombocytosis, unspecified: Secondary | ICD-10-CM | POA: Diagnosis present

## 2018-05-23 DIAGNOSIS — Z86010 Personal history of colonic polyps: Secondary | ICD-10-CM

## 2018-05-23 DIAGNOSIS — I252 Old myocardial infarction: Secondary | ICD-10-CM

## 2018-05-23 DIAGNOSIS — I639 Cerebral infarction, unspecified: Secondary | ICD-10-CM | POA: Diagnosis not present

## 2018-05-23 DIAGNOSIS — L409 Psoriasis, unspecified: Secondary | ICD-10-CM | POA: Diagnosis present

## 2018-05-23 DIAGNOSIS — R110 Nausea: Secondary | ICD-10-CM | POA: Diagnosis not present

## 2018-05-23 DIAGNOSIS — Z841 Family history of disorders of kidney and ureter: Secondary | ICD-10-CM

## 2018-05-23 DIAGNOSIS — R1084 Generalized abdominal pain: Secondary | ICD-10-CM | POA: Diagnosis not present

## 2018-05-23 DIAGNOSIS — R0602 Shortness of breath: Secondary | ICD-10-CM

## 2018-05-23 DIAGNOSIS — Z91048 Other nonmedicinal substance allergy status: Secondary | ICD-10-CM

## 2018-05-23 DIAGNOSIS — E1140 Type 2 diabetes mellitus with diabetic neuropathy, unspecified: Secondary | ICD-10-CM | POA: Diagnosis present

## 2018-05-23 DIAGNOSIS — Z880 Allergy status to penicillin: Secondary | ICD-10-CM

## 2018-05-23 DIAGNOSIS — R197 Diarrhea, unspecified: Secondary | ICD-10-CM

## 2018-05-23 DIAGNOSIS — F32A Depression, unspecified: Secondary | ICD-10-CM | POA: Diagnosis present

## 2018-05-23 DIAGNOSIS — Z8669 Personal history of other diseases of the nervous system and sense organs: Secondary | ICD-10-CM | POA: Diagnosis not present

## 2018-05-23 DIAGNOSIS — R2689 Other abnormalities of gait and mobility: Secondary | ICD-10-CM | POA: Diagnosis not present

## 2018-05-23 DIAGNOSIS — Z8249 Family history of ischemic heart disease and other diseases of the circulatory system: Secondary | ICD-10-CM

## 2018-05-23 DIAGNOSIS — R498 Other voice and resonance disorders: Secondary | ICD-10-CM | POA: Diagnosis not present

## 2018-05-23 DIAGNOSIS — K3189 Other diseases of stomach and duodenum: Secondary | ICD-10-CM | POA: Diagnosis not present

## 2018-05-23 DIAGNOSIS — Z803 Family history of malignant neoplasm of breast: Secondary | ICD-10-CM

## 2018-05-23 DIAGNOSIS — M6281 Muscle weakness (generalized): Secondary | ICD-10-CM | POA: Diagnosis not present

## 2018-05-23 DIAGNOSIS — IMO0002 Reserved for concepts with insufficient information to code with codable children: Secondary | ICD-10-CM

## 2018-05-23 DIAGNOSIS — Z881 Allergy status to other antibiotic agents status: Secondary | ICD-10-CM

## 2018-05-23 DIAGNOSIS — R1013 Epigastric pain: Secondary | ICD-10-CM | POA: Diagnosis not present

## 2018-05-23 DIAGNOSIS — R799 Abnormal finding of blood chemistry, unspecified: Secondary | ICD-10-CM | POA: Diagnosis not present

## 2018-05-23 DIAGNOSIS — Z794 Long term (current) use of insulin: Secondary | ICD-10-CM

## 2018-05-23 LAB — POC OCCULT BLOOD, ED: Fecal Occult Bld: POSITIVE — AB

## 2018-05-23 LAB — CBC WITH DIFFERENTIAL/PLATELET
Basophils Absolute: 0.5 10*3/uL — ABNORMAL HIGH (ref 0.0–0.1)
Basophils Relative: 1 %
EOS PCT: 0 %
Eosinophils Absolute: 0 10*3/uL (ref 0.0–0.7)
HCT: 22.1 % — ABNORMAL LOW (ref 36.0–46.0)
Hemoglobin: 6.2 g/dL — CL (ref 12.0–15.0)
LYMPHS ABS: 1.9 10*3/uL (ref 0.7–4.0)
Lymphocytes Relative: 4 %
MCH: 25.3 pg — ABNORMAL LOW (ref 26.0–34.0)
MCHC: 28.1 g/dL — ABNORMAL LOW (ref 30.0–36.0)
MCV: 90.2 fL (ref 78.0–100.0)
MONO ABS: 1.9 10*3/uL — AB (ref 0.1–1.0)
MONOS PCT: 4 %
Neutro Abs: 42.5 10*3/uL — ABNORMAL HIGH (ref 1.7–7.7)
Neutrophils Relative %: 91 %
PLATELETS: 703 10*3/uL — AB (ref 150–400)
RBC: 2.45 MIL/uL — AB (ref 3.87–5.11)
RDW: 21.1 % — ABNORMAL HIGH (ref 11.5–15.5)
WBC: 46.8 10*3/uL — ABNORMAL HIGH (ref 4.0–10.5)

## 2018-05-23 LAB — HEPATIC FUNCTION PANEL
ALT: 9 U/L (ref 0–44)
AST: 23 U/L (ref 15–41)
Albumin: 3.1 g/dL — ABNORMAL LOW (ref 3.5–5.0)
Alkaline Phosphatase: 119 U/L (ref 38–126)
BILIRUBIN INDIRECT: 1 mg/dL — AB (ref 0.3–0.9)
Bilirubin, Direct: 0.3 mg/dL — ABNORMAL HIGH (ref 0.0–0.2)
TOTAL PROTEIN: 5.6 g/dL — AB (ref 6.5–8.1)
Total Bilirubin: 1.3 mg/dL — ABNORMAL HIGH (ref 0.3–1.2)

## 2018-05-23 LAB — BASIC METABOLIC PANEL
Anion gap: 9 (ref 5–15)
BUN: 15 mg/dL (ref 8–23)
CO2: 22 mmol/L (ref 22–32)
CREATININE: 1.16 mg/dL — AB (ref 0.44–1.00)
Calcium: 8.4 mg/dL — ABNORMAL LOW (ref 8.9–10.3)
Chloride: 110 mmol/L (ref 98–111)
GFR, EST AFRICAN AMERICAN: 51 mL/min — AB (ref >60)
GFR, EST NON AFRICAN AMERICAN: 44 mL/min — AB (ref >60)
Glucose, Bld: 210 mg/dL — ABNORMAL HIGH (ref 70–99)
POTASSIUM: 4.2 mmol/L (ref 3.5–5.1)
SODIUM: 141 mmol/L (ref 135–145)

## 2018-05-23 LAB — URINALYSIS, ROUTINE W REFLEX MICROSCOPIC
Bilirubin Urine: NEGATIVE
Glucose, UA: NEGATIVE mg/dL
Ketones, ur: NEGATIVE mg/dL
LEUKOCYTES UA: NEGATIVE
NITRITE: NEGATIVE
PROTEIN: 100 mg/dL — AB
SPECIFIC GRAVITY, URINE: 1.013 (ref 1.005–1.030)
pH: 5 (ref 5.0–8.0)

## 2018-05-23 LAB — SAVE SMEAR

## 2018-05-23 LAB — PREPARE RBC (CROSSMATCH)

## 2018-05-23 LAB — CBC
HCT: 26.8 % — ABNORMAL LOW (ref 36.0–46.0)
Hemoglobin: 7.8 g/dL — ABNORMAL LOW (ref 12.0–15.0)
MCH: 26.8 pg (ref 26.0–34.0)
MCHC: 29.1 g/dL — ABNORMAL LOW (ref 30.0–36.0)
MCV: 92.1 fL (ref 78.0–100.0)
Platelets: 697 10*3/uL — ABNORMAL HIGH (ref 150–400)
RBC: 2.91 MIL/uL — ABNORMAL LOW (ref 3.87–5.11)
RDW: 19.5 % — ABNORMAL HIGH (ref 11.5–15.5)
WBC: 46.9 10*3/uL — ABNORMAL HIGH (ref 4.0–10.5)

## 2018-05-23 LAB — GLUCOSE, CAPILLARY: GLUCOSE-CAPILLARY: 151 mg/dL — AB (ref 70–99)

## 2018-05-23 LAB — LIPASE, BLOOD: LIPASE: 41 U/L (ref 11–51)

## 2018-05-23 LAB — PROTIME-INR
INR: 1.32
Prothrombin Time: 16.2 seconds — ABNORMAL HIGH (ref 11.4–15.2)

## 2018-05-23 LAB — ABO/RH: ABO/RH(D): O POS

## 2018-05-23 MED ORDER — LORAZEPAM 1 MG PO TABS
0.5000 mg | ORAL_TABLET | Freq: Once | ORAL | Status: AC
  Administered 2018-05-23: 0.5 mg via ORAL
  Filled 2018-05-23: qty 1

## 2018-05-23 MED ORDER — IOPAMIDOL (ISOVUE-300) INJECTION 61%
100.0000 mL | Freq: Once | INTRAVENOUS | Status: AC | PRN
  Administered 2018-05-23: 100 mL via INTRAVENOUS

## 2018-05-23 MED ORDER — IOHEXOL 300 MG/ML  SOLN
100.0000 mL | Freq: Once | INTRAMUSCULAR | Status: AC | PRN
  Administered 2018-05-23: 75 mL via INTRAVENOUS

## 2018-05-23 MED ORDER — MORPHINE SULFATE (PF) 2 MG/ML IV SOLN: 1.0000 mg | INTRAVENOUS | Status: DC | PRN

## 2018-05-23 MED ORDER — LOPERAMIDE HCL 2 MG PO TABS: 2.0000 mg | ORAL_TABLET | Freq: Every day | ORAL | Status: DC | PRN

## 2018-05-23 MED ORDER — DIPHENOXYLATE-ATROPINE 2.5-0.025 MG PO TABS: 1.0000 | ORAL_TABLET | Freq: Two times a day (BID) | ORAL | Status: DC | PRN

## 2018-05-23 MED ORDER — ACETAMINOPHEN 650 MG RE SUPP: 650.0000 mg | Freq: Four times a day (QID) | RECTAL | Status: DC | PRN

## 2018-05-23 MED ORDER — HYDRALAZINE HCL 20 MG/ML IJ SOLN: 5.0000 mg | Freq: Three times a day (TID) | INTRAMUSCULAR | Status: DC | PRN

## 2018-05-23 MED ORDER — PANCRELIPASE (LIP-PROT-AMYL) 12000-38000 UNITS PO CPEP
36000.0000 [IU] | ORAL_CAPSULE | Freq: Three times a day (TID) | ORAL | Status: DC
  Administered 2018-05-24 – 2018-05-28 (×11): 36000 [IU] via ORAL
  Filled 2018-05-23 (×2): qty 3
  Filled 2018-05-23: qty 1
  Filled 2018-05-23 (×8): qty 3

## 2018-05-23 MED ORDER — ONDANSETRON HCL 4 MG/2ML IJ SOLN: 4.0000 mg | Freq: Four times a day (QID) | INTRAMUSCULAR | Status: DC | PRN

## 2018-05-23 MED ORDER — ONDANSETRON HCL 4 MG PO TABS: 4.0000 mg | ORAL_TABLET | Freq: Four times a day (QID) | ORAL | Status: DC | PRN

## 2018-05-23 MED ORDER — SODIUM CHLORIDE 0.9% IV SOLUTION
Freq: Once | INTRAVENOUS | Status: AC
  Administered 2018-05-23: 17:00:00 via INTRAVENOUS

## 2018-05-23 MED ORDER — INSULIN ASPART 100 UNIT/ML ~~LOC~~ SOLN
0.0000 [IU] | Freq: Three times a day (TID) | SUBCUTANEOUS | Status: DC
  Administered 2018-05-24 – 2018-05-26 (×7): 2 [IU] via SUBCUTANEOUS
  Administered 2018-05-27: 3 [IU] via SUBCUTANEOUS
  Administered 2018-05-27: 9 [IU] via SUBCUTANEOUS
  Administered 2018-05-27: 5 [IU] via SUBCUTANEOUS
  Administered 2018-05-28: 2 [IU] via SUBCUTANEOUS
  Administered 2018-05-28: 7 [IU] via SUBCUTANEOUS

## 2018-05-23 MED ORDER — FAMOTIDINE IN NACL 20-0.9 MG/50ML-% IV SOLN
20.0000 mg | Freq: Every day | INTRAVENOUS | Status: DC
Start: 2018-05-23 — End: 2018-05-24
  Administered 2018-05-23: 20 mg via INTRAVENOUS
  Filled 2018-05-23: qty 50

## 2018-05-23 MED ORDER — GABAPENTIN 100 MG PO CAPS
100.0000 mg | ORAL_CAPSULE | Freq: Two times a day (BID) | ORAL | Status: DC
  Administered 2018-05-24: 100 mg via ORAL
  Filled 2018-05-23 (×3): qty 1

## 2018-05-23 MED ORDER — LACTATED RINGERS IV BOLUS
500.0000 mL | Freq: Once | INTRAVENOUS | Status: AC
  Administered 2018-05-23: 500 mL via INTRAVENOUS

## 2018-05-23 MED ORDER — ROSUVASTATIN CALCIUM 10 MG PO TABS
20.0000 mg | ORAL_TABLET | Freq: Every day | ORAL | Status: DC
  Administered 2018-05-23 – 2018-05-27 (×4): 20 mg via ORAL
  Filled 2018-05-23 (×4): qty 2

## 2018-05-23 MED ORDER — HYDROCODONE-ACETAMINOPHEN 5-325 MG PO TABS: 1.0000 | ORAL_TABLET | ORAL | Status: DC | PRN

## 2018-05-23 MED ORDER — ACETAMINOPHEN 325 MG PO TABS: 650.0000 mg | ORAL_TABLET | Freq: Four times a day (QID) | ORAL | Status: DC | PRN

## 2018-05-23 MED ORDER — MIRTAZAPINE 7.5 MG PO TABS: 7.5000 mg | ORAL_TABLET | Freq: Every day | ORAL | Status: DC

## 2018-05-23 MED ORDER — SODIUM CHLORIDE 0.9 % IV SOLN
INTRAVENOUS | Status: DC
  Administered 2018-05-23 – 2018-05-24 (×2): via INTRAVENOUS

## 2018-05-23 NOTE — Telephone Encounter (Signed)
Patient husband is requesting a nurse to give him a call in regards to patient being in the ED. Please advise

## 2018-05-23 NOTE — ED Notes (Signed)
Dr. Shana Chute aware of 6.2 hgb and 46.8 WBC

## 2018-05-23 NOTE — ED Triage Notes (Signed)
Pt BIBA for C/o LLQ pain that began last night and has been getting worse since last night , pt denies any vomiting but states she is nauseated and has been having diarrhea since last night ; pt also c/o body aches x 3days ; pt denies any fevers ; 160/82, RR 18; Hr 90 , cbg 181

## 2018-05-23 NOTE — Telephone Encounter (Signed)
Patient's husband calling to cancel today's appointment. He states he's called 911 for patient. Patient in route to [facility]. He states the patient is getting weaker and slurring words. He states she has a history of stroke and he feels like she may be having another.   Dr [name] notified.

## 2018-05-23 NOTE — H&P (Addendum)
History and Physical    Amanda [name] YBW:[id number] DOB: [birth year] DOA: 05/23/2018   PCP: [name], NP   Patient coming from:  Home    Chief Complaint: Symptomatic anemia  HPI: Amanda [name] is a 77 y.o. 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 difficile  3 years ago, chronic diarrhea on chronic Imodium, presenting with left lower quadrant abdominal pain, first episode about 2 weeks ago, described as sharp left lower quadrant pain, but these symptoms got worse overnight, for which she sought medical attention.  She denies any gross blood in the stools.  She was feeling nauseous on presentation.  She denies any fever, chills, she had some nausea without vomiting, she denies any chest pain or palpitations, she denies any shortness of breath or cough.  She denies any sick contacts or food poisoning.  She is compliant with her medications.  She denies any dysuria, gross hematuria, vaginal bleeding, lower extremity swelling or calf pain.  She does take aspirin a day, and reports being compliant with her medications.  Of note, her Hydrea was modified a few weeks ago.  She is very anxious, easily irritable, does not want to provide any further information, but no acute mental status changes are noted.  She denies any tobacco, alcohol or recreational drug use.  Denies any pruritic rash   ED Course:  BP (!) 132/57   Pulse (!) 103   Temp 98.9 F (37.2 C) (Oral)   Resp 18   Ht _0  (1.626 m)   Wt 96.2 kg (212 lb)   SpO2 97%   BMI 36.39 kg/m    At the ER, she was noted to have a hemoglobin of 6.2, with a white count of 46.8.  This is in the setting of polycythemia vera.  The patient has never had a blood transfusion before, and she reports also that her white count has never been this high. Per EDP, her rectal exam showed no anal seizures, hemorrhoids or gross blood.  Her stool guaiac however, was  positive. She received 1 unit of blood, and she is to receive another one. CT of the abdomen and pelvis was performed, showing ascites, and diverticulosis, but no diverticulitis.  She would be admitted for further evaluation and management of her symptoms. Urinalysis is with microscopic hematuria, otherwise no UTI is noted. Lipase is normal. Note, in review, in June, her hemoglobin was 12.3, and as of April 13, 2018 it was 9.1, today 6.2, with normal MCV.  Smear has been ordered for review. Platelets are 703. Glucose 210 Creatinine 1.16 Hepatic function panel shows total bilirubin 1.3, direct bilirubin 0.3, indirect bilirubin 1.0 He was given IV fluids, Ativan for nausea, with significant improvement of her symptoms.  She is denying any abdominal pain at this time nausea or vomiting.  Review of Systems:  As per HPI otherwise all other systems reviewed and are negative  Past Medical History:  Diagnosis Date  . Allergic rhinitis 01/23/2016  . C. difficile colitis   . CAD (coronary artery disease)    a. s/p STEMI in 01/2015 with 95% LCx stenosis and distal 80% LCx stenosis (DESx2 placed)  . Cancer ([place])    SKIN  . Candidiasis of skin 09/30/2014  . Cirrhosis ([place])   . Depression   . Diabetes mellitus 2008  . Gout   . Herpes simplex   . Hyperglycemia 05/31/2013  . Hyperlipidemia   .  Hypertension   . Myocardial infarction ([place])   . 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 Obesity   . Polycythemia    Dr. Elease Hashimoto- [facility]  . Psoriasis     Past Surgical History:  Procedure Laterality Date  . BREAST SURGERY Left    milk duct  . CATARACT EXTRACTION, BILATERAL    . COLONOSCOPY    . LEFT HEART CATHETERIZATION WITH CORONARY ANGIOGRAM N/A 01/27/2015   Procedure: LEFT HEART CATHETERIZATION WITH CORONARY ANGIOGRAM;  Surgeon: [name], MD;  Location: [facility] CATH LAB;  Service: Cardiovascular;  Laterality: N/A;  . TONSILLECTOMY    . TOTAL ABDOMINAL HYSTERECTOMY W/ BILATERAL  SALPINGOOPHORECTOMY     for heavy periods with appendectomy    Social History Social History   Socioeconomic History  . Marital status: Married    Spouse name: Not on file  . Number of children: 2  . Years of education: 2 yr colle  . Highest education level: Not on file  Occupational History  . Occupation: housewife  Social Needs  . Financial resource strain: Not on file  . Food insecurity:    Worry: Not on file    Inability: Not on file  . Transportation needs:    Medical: Not on file    Non-medical: Not on file  Tobacco Use  . Smoking status: Former Smoker    Years: 48.00    Types: Cigarettes    Last attempt to quit: 10/18/1986    Years since quitting: 31.6  . Smokeless tobacco: Never Used  Substance and Sexual Activity  . Alcohol use: No    Alcohol/week: 0.0 oz  . Drug use: No  . Sexual activity: Yes    Partners: Male    Birth control/protection: None  Lifestyle  . Physical activity:    Days per week: Not on file    Minutes per session: Not on file  . Stress: Not on file  Relationships  . Social connections:    Talks on phone: Not on file    Gets together: Not on file    Attends religious service: Not on file    Active member of club or organization: Not on file    Attends meetings of clubs or organizations: Not on file    Relationship status: Not on file  . Intimate partner violence:    Fear of current or ex partner: Not on file    Emotionally abused: Not on file    Physically abused: Not on file    Forced sexual activity: Not on file  Other Topics Concern  . Not on file  Social History Narrative   2 caffeine drink per day.  No regular exercise.     Retired from the bank   2 children (Daughter and a son) son has morbid obesity   2 grandchildren   3 great grandchildren     Allergies  Allergen Reactions  . Penicillins Swelling    Has patient had a PCN reaction causing immediate rash, facial/tongue/throat swelling, SOB or lightheadedness with  hypotension: No Has patient had a PCN reaction causing severe rash involving mucus membranes or skin necrosis: No Has patient had a PCN reaction that required hospitalization: No Has patient had a PCN reaction occurring within the last 10 years: No If all of the above answers are "NO", then may proceed with Cephalosporin use.  Marland Kitchen Lisinopril Cough  . Tape Hives  . Doxycycline Hives, Swelling and Rash  . Latex Hives, Itching and  Rash    Family History  Problem Relation Age of Onset  . Diabetes Mother   . Heart disease Mother        CAD  . Hyperlipidemia Mother   . Hypertension Mother   . Kidney disease Mother   . Kidney disease Father   . Breast cancer Maternal Aunt   . Heart disease Maternal Grandmother   . Heart disease Other        maternal aunts and uncles       Prior to Admission medications   Medication Sig Start Date End Date Taking? Authorizing Provider  amLODipine (NORVASC) 10 MG tablet Take 1 tablet (10 mg total) by mouth daily. 04/06/18  Yes Nafziger, Tommi Rumps, NP  aspirin 325 MG tablet Take 1 tablet (325 mg total) by mouth daily. 03/27/18  Yes Donne Hazel, MD  diphenoxylate-atropine (LOMOTIL) 2.5-0.025 MG tablet Take 1-2 tablets by mouth 2 (two) times daily as needed for diarrhea or loose stools. 01/05/18  Yes Nafziger, Tommi Rumps, NP  gabapentin (NEURONTIN) 100 MG capsule Take 1 capsule (100 mg total) by mouth 2 (two) times daily. 03/26/18  Yes Donne Hazel, MD  lipase/protease/amylase (CREON) 36000 UNITS CPEP capsule Take 1 capsule (36,000 Units total) by mouth 3 (three) times daily with meals. 03/14/18  Yes [place], Holli Humbles, NP  loperamide (IMODIUM A-D) 2 MG tablet Take 1-2 tablets (2-4 mg total) by mouth daily as needed for diarrhea or loose stools. 05/28/16  Yes Hali Marry, MD  mirtazapine (REMERON) 7.5 MG tablet Take 1 tablet (7.5 mg total) by mouth at bedtime. 05/11/18  Yes Nafziger, Tommi Rumps, NP  ondansetron (ZOFRAN ODT) 4 MG disintegrating tablet Take 1 tablet (4  mg total) by mouth every 8 (eight) hours as needed. Patient taking differently: Take 4 mg by mouth every 8 (eight) hours as needed for nausea or vomiting.  01/04/18  Yes Sherwood Gambler, MD  promethazine (PHENERGAN) 25 MG tablet Take 0.5 tablets (12.5 mg total) by mouth every 6 (six) hours as needed for nausea or vomiting. 12/15/17  Yes Ennever, Rudell Cobb, MD  rosuvastatin (CRESTOR) 20 MG tablet TAKE 1 TABLET(20 MG) BY MOUTH DAILY 10/17/17  Yes [name], MD  triamcinolone ointment (KENALOG) 0.5 % Apply 1 application topically 2 (two) times daily. 02/23/18  Yes Nafziger, Tommi Rumps, NP  ACCU-CHEK FASTCLIX LANCETS MISC Use to check blood sugars 3 times daily 05/12/18   [name], MD  Blood Glucose Monitoring Suppl (ACCU-CHEK GUIDE) w/Device KIT 1 kit by Does not apply route as directed. 01/25/18   [name], MD  glucose blood (ACCU-CHEK GUIDE) test strip Use to check blood sugars 3 times daily 01/25/18   [name], MD  hydroxyurea (HYDREA) 500 MG capsule Take 1 capsule (500 mg total) by mouth 2 (two) times daily. May take with food to minimize GI side effects. Patient not taking: Reported on 05/23/2018 04/13/18   Volanda Napoleon, MD  Insulin Pen Needle (BD PEN NEEDLE NANO U/F) 32G X 4 MM MISC USE TO INJECT INSULIN AS DIRECTED. 04/27/18   [name], MD  [facility] VERIO test strip USE TO TEST BLOOD SUGAR THREE TIMES DAILY 05/01/18   [name], MD     Physical Exam:  Vitals:   05/23/18 0941 05/23/18 1320 05/23/18 1631 05/23/18 1646  BP:  (!) 174/56 (!) 146/45 (!) 132/57  Pulse:  (!) 105 (!) 105 (!) 103  Resp:  _0 Temp:   98.6 F (37 C) 98.9 F (37.2 C)  TempSrc:   Oral Oral  SpO2:  97% 98% 97%  Weight: 96.2 kg (212 lb)     Height: _0  (1.626 m)      Constitutional: Very anxious, appears uncomfortable, leaning on her right side, alert, easily irritable Eyes: PERRL, lids and conjunctivae normal ENMT: Mucous membranes are moist, without exudate or lesions    Neck: normal, supple, no masses, no thyromegaly Respiratory: clear to auscultation bilaterally, no wheezing, no crackles. Normal respiratory effort  Cardiovascular: Tachycardic, no murmur, rubs or gallops. No extremity edema. 2+ pedal pulses. No carotid bruits.  Abdomen distended, known ascites, no hepatosplenomegaly. Bowel sounds positive.  At this time, the patient denies any tenderness in all quadrants Musculoskeletal: no clubbing / cyanosis. Moves all extremities Skin: no jaundice, No lesions.  Neurologic: Sensation intact  Strength equal in all extremities Psychiatric:   Alert and oriented x 3.  Very anxious mood    Labs on Admission: I have personally reviewed following labs and imaging studies  CBC: Recent Labs  Lab 05/23/18 1042  WBC 46.8*  NEUTROABS 42.5*  HGB 6.2*  HCT 22.1*  MCV 90.2  PLT 703*    Basic Metabolic Panel: Recent Labs  Lab 05/23/18 1042  NA 141  K 4.2  CL 110  CO2 22  GLUCOSE 210*  BUN 15  CREATININE 1.16*  CALCIUM 8.4*    GFR: Estimated Creatinine Clearance: 45 mL/min (A) (by C-G formula based on SCr of 1.16 mg/dL (H)).  Liver Function Tests: Recent Labs  Lab 05/23/18 1042  AST 23  ALT 9  ALKPHOS 119  BILITOT 1.3*  PROT 5.6*  ALBUMIN 3.1*   Recent Labs  Lab 05/23/18 1042  LIPASE 41   No results for input(s): AMMONIA in the last 168 hours.  Coagulation Profile: Recent Labs  Lab 05/23/18 1317  INR 1.32    Cardiac Enzymes: No results for input(s): CKTOTAL, CKMB, CKMBINDEX, TROPONINI in the last 168 hours.  BNP (last 3 results) No results for input(s): PROBNP in the last 8760 hours.  HbA1C: No results for input(s): HGBA1C in the last 72 hours.  CBG: No results for input(s): GLUCAP in the last 168 hours.  Lipid Profile: No results for input(s): CHOL, HDL, LDLCALC, TRIG, CHOLHDL, LDLDIRECT in the last 72 hours.  Thyroid Function Tests: No results for input(s): TSH, T4TOTAL, FREET4, T3FREE, THYROIDAB in the last 72  hours.  Anemia Panel: No results for input(s): VITAMINB12, FOLATE, FERRITIN, TIBC, IRON, RETICCTPCT in the last 72 hours.  Urine analysis:    Component Value Date/Time   COLORURINE YELLOW 05/23/2018 1341   APPEARANCEUR HAZY (A) 05/23/2018 1341   LABSPEC 1.013 05/23/2018 1341   PHURINE 5.0 05/23/2018 1341   GLUCOSEU NEGATIVE 05/23/2018 1341   GLUCOSEU NEGATIVE 01/10/2018 1217   HGBUR LARGE (A) 05/23/2018 1341   HGBUR large 05/07/2010 0845   BILIRUBINUR NEGATIVE 05/23/2018 1341   BILIRUBINUR neg 01/26/2016 1003   KETONESUR NEGATIVE 05/23/2018 1341   PROTEINUR 100 (A) 05/23/2018 1341   UROBILINOGEN 0.2 01/10/2018 1217   NITRITE NEGATIVE 05/23/2018 1341   LEUKOCYTESUR NEGATIVE 05/23/2018 1341    Sepsis Labs: _1 (procalcitonin:4,lacticidven:4) )No results found for this or any previous visit (from the past 240 hour(s)).   Radiological Exams on Admission: Ct Abdomen Pelvis W Contrast  Result Date: 05/23/2018 CLINICAL DATA:  Left lower quadrant abdominal pain beginning last night and worsening today. EXAM: CT ABDOMEN AND PELVIS WITH CONTRAST TECHNIQUE: Multidetector CT imaging of the abdomen and pelvis was performed using the standard protocol  following bolus administration of intravenous contrast. CONTRAST:  14m ISOVUE-300 IOPAMIDOL (ISOVUE-300) INJECTION 61% COMPARISON:  01/04/2018 FINDINGS: The patient is scanned in the right-side-down decubitus position. lower chest: Negative Hepatobiliary: Chronic cirrhosis of the liver. No acute liver finding. No calcified gallstones. Pancreas: No lesion of the pancreas identified. Spleen: Splenomegaly as seen previously. Adrenals/Urinary Tract: Normal adrenal glands. Normal kidneys. Normal bladder. Stomach/Bowel: No acute bowel pathology is seen. Patient has diverticulosis of the sigmoid colon but I do not see evidence of diverticulitis. No bowel obstruction. Vascular/Lymphatic: Aortic atherosclerosis. No aneurysm. IVC is normal. No  retroperitoneal adenopathy. Reproductive: No pelvic mass. Other: Increase in amount of ascites, freely distributed and layering dependently due to the decubitus positioning. Musculoskeletal: Negative IMPRESSION: Patient is scanned in the right-side-down decubitus position. Marked increase in the amount of ascites, freely distributed within the peritoneal space and layering dependently due to the decubitus positioning. 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 Flow is present in the portal system, but I cannot determine direction by CT. Diverticulosis but no evidence of diverticulitis. Electronically Signed   By: MNelson ChimesM.D.   On: 05/23/2018 16:12    EKG: Independently reviewed.  Assessment/Plan Principal Problem:   Acute blood loss anemia Active Problems:   Hyperlipidemia   Depression   Essential hypertension, benign   DIASTOLIC DYSFUNCTION   PSORIASIS   [name] (HCC)   Thrombocytosis (HCC)   Coronary artery disease involving native coronary artery   Hepatic cirrhosis (HCC)   OSA (obstructive sleep apnea)   Chronic diarrhea   Iron deficiency anemia   [place]    Acute left lower quadrant pain, with CT of the abdomen confirming diverticulosis without diverticulitis.  In addition, the patient has a remote history of C. difficile, chronic diarrhea.  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  No other acute abdominal findings are noted.  She has known history of hepatic cirrhosis and ascites.  The patient is afebrile.  She received IV fluids, pain medication, as well as antiemetics, with significant improvement of her symptoms.  Given IV at the ED with IVF  Admit to telemetry inpatient Gastric and bowel rest. Continue IV hydration with normal saline. Protonix 40 mg IV every 24  hours. Analgesics and antiemetics as needed. Consider surgical evaluation if no improvement. For chronic diarrhea, continue with her Imodium, as well as Creon.  Anemia, likely of blood loss, in the setting of iron deficiency versus medicine related (Hydrea) and a history of liver cirrhosis   Per EDP on digital rectal exam stool was positive, but no abnormalities were noted.  Hemoglobin was 6.2, and received 1 unit of blood, and is receiving his second unit while at the ER.  Smear from blood drawn before transfusion is ordered for review.  INR 1.32, PT 16.2, PTT 42  telemetry Cycle CBC every 6 hours Check TSH.  Check PT/INR/PTT  NPO, advance to Clear liquids only Normal saline IV fluid  Protonix 446mIV BID  Hold all NSAIDs including preadmission baby aspirin Will hold on GI consult at this time.  [name], Jak 2+, was on JaBarnabas Listerositive but [facility].  Platelets today 703. Ennever has been notified of the patient's admission, and to evaluate the role of Hydrea, in these episodes of anemia as mentioned above,.  For  now, we will  Hold  Hydrea   OSA Continue CPAP nightly  Type II Diabetes with neuropathy current blood sugar level is 210 Lab Results  Component Value Date   HGBA1C 7.9 (H) 03/23/2018  SSI  Continue Neurontin  Hyperlipidemia Continue home statins   Hypertension BP  132/57   Pulse  103  Hold meds for now, in view of significant anemia, rule out any further acute blood loss, which may lower her blood pressure even further.  Anxiety and depression Continue Remeron  DVT prophylaxis:  SCD  Code Status:    DNR  Family Communication:  Discussed with patient Disposition Plan: Expect patient to be discharged to home after condition improves Consults called:    HEmatology per EDP  Admission status: IP tele   Sharene Butters, PA-C [facility]   Amion text  [phone]   05/23/2018, 5:53 PM

## 2018-05-23 NOTE — Progress Notes (Signed)
Received report from [place], RN in ED for transfer of pt to [phone].

## 2018-05-23 NOTE — Telephone Encounter (Signed)
Noted  

## 2018-05-23 NOTE — ED Notes (Signed)
Patient is aware that she needs to give stool sample- patient states "You won't get it tonight"-Monique,RN

## 2018-05-23 NOTE — Telephone Encounter (Signed)
I called to get more information, husband answered phone, said he wanted to make sure that Amanda [name] would be reviewing this ER note, nothing further to report.

## 2018-05-23 NOTE — ED Notes (Signed)
Blood transfusion complete.  T. 99   No reaction noted.

## 2018-05-23 NOTE — ED Provider Notes (Signed)
[facility] EMERGENCY DEPARTMENT Provider Note   CSN: [id number] Arrival date & time: 05/23/18  0174     History   Chief Complaint Chief Complaint  Patient presents with  . Abdominal Pain    HPI Amanda [name] is a 77 y.o. female with a history of C. Difficile, CAD, polycythemia vera, cirrhosis, and diabetes who presents due to left lower quadrant pain.  She says that she has had 2 weeks of severe, sharp left lower quadrant pain.  She says that her pain got much worse last night.  She says that she has been having diarrhea for several days and that last night she had 5-6 episodes of nonbloody diarrhea.  She reports feeling nauseous.  She denies fever, chills, vomiting, chest pain, shortness of breath.  She reports having colitis several years ago.  She does not think that this is related to this.  She denies dysuria and hematuria.  She denies vaginal bleeding and change in vaginal discharge.  HPI  Past Medical History:  Diagnosis Date  . Allergic rhinitis 01/23/2016  . C. difficile colitis   . CAD (coronary artery disease)    a. s/p STEMI in 01/2015 with 95% LCx stenosis and distal 80% LCx stenosis (DESx2 placed)  . Cancer ([place])    SKIN  . Candidiasis of skin 09/30/2014  . Cirrhosis ([place])   . Depression   . Diabetes mellitus 2008  . Gout   . Herpes simplex   . Hyperglycemia 05/31/2013  . Hyperlipidemia   . Hypertension   . Myocardial infarction ([place])   . Neuromuscular disorder ([place])    BELL PALSY  . Obesity   . Polycythemia    Dr. Elease Hashimoto- [facility]  . Psoriasis     Patient Active Problem List   Diagnosis Date Noted  . Dehydration 03/22/2018  . AKI (acute kidney injury) ([place]) 03/22/2018  . CVA (cerebral vascular accident) ([place]) 03/22/2018  . Hip injury, left, subsequent encounter 11/08/2017  . Iron deficiency anemia 12/22/2016  . OSA (obstructive sleep apnea) 04/21/2016  . Osteopenia 04/07/2016  . Lung nodule, solitary 02/05/2016  . Aortic  dilatation ([place]) 02/05/2016  . Dyspnea 01/28/2016  . Allergic rhinitis 01/23/2016  . Hepatic cirrhosis ([place]) 10/28/2015  . Irritable bowel syndrome 08/12/2015  . Obesity (BMI 30-39.9) 04/30/2015  . Diabetic retinopathy of both eyes ([place]) 04/18/2015  . Former smoker 04/18/2015  . GAD (generalized anxiety disorder) 04/18/2015  . Status post primary angioplasty with coronary stent 04/18/2015  . Coronary artery disease involving native coronary artery 03/01/2015  . Acute diastolic heart failure ([place]) 02/01/2015  . ST elevation myocardial infarction (STEMI) involving left circumflex coronary artery in recovery phase ([place]) 01/27/2015  . Idioventricular rhythm ([place])   . Diabetic peripheral neuropathy associated with type 2 diabetes mellitus ([place]) 01/23/2015  . Type 2 diabetes mellitus with diabetic polyneuropathy ([place]) 01/23/2015  . Thrombocytosis ([place]) 07/04/2014  . Cholelithiasis 02/07/2014  . Chronic diarrhea 01/29/2014  . [name] ([place]) 05/31/2013  . Essential hypertension 05/31/2013  . History of Bell's palsy 05/26/2013  . DERMATITIS, ATOPIC 11/16/2010  . DIZZINESS 11/16/2010  . DIASTOLIC DYSFUNCTION 94/49/6759  . Hyperlipidemia 12/15/2009  . Essential hypertension, benign 11/24/2009  . PSORIASIS 11/24/2009  . Depression 09/13/2009  . 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[place]) 07/08/2008    Past Surgical History:  Procedure Laterality Date  . BREAST SURGERY Left    milk duct  . CATARACT EXTRACTION, BILATERAL    . COLONOSCOPY    .  LEFT HEART CATHETERIZATION WITH CORONARY ANGIOGRAM N/A 01/27/2015   Procedure: LEFT HEART CATHETERIZATION WITH CORONARY ANGIOGRAM;  Surgeon: [name], MD;  Location: [facility] CATH LAB;  Service: Cardiovascular;  Laterality: N/A;  . TONSILLECTOMY    . TOTAL ABDOMINAL HYSTERECTOMY W/ BILATERAL SALPINGOOPHORECTOMY     for heavy periods with appendectomy     OB History   None      Home Medications    Prior to Admission  medications   Medication Sig Start Date End Date Taking? Authorizing Provider  amLODipine (NORVASC) 10 MG tablet Take 1 tablet (10 mg total) by mouth daily. 04/06/18  Yes Nafziger, Tommi Rumps, NP  aspirin 325 MG tablet Take 1 tablet (325 mg total) by mouth daily. 03/27/18  Yes Donne Hazel, MD  diphenoxylate-atropine (LOMOTIL) 2.5-0.025 MG tablet Take 1-2 tablets by mouth 2 (two) times daily as needed for diarrhea or loose stools. 01/05/18  Yes Nafziger, Tommi Rumps, NP  gabapentin (NEURONTIN) 100 MG capsule Take 1 capsule (100 mg total) by mouth 2 (two) times daily. 03/26/18  Yes Donne Hazel, MD  lipase/protease/amylase (CREON) 36000 UNITS CPEP capsule Take 1 capsule (36,000 Units total) by mouth 3 (three) times daily with meals. 03/14/18  Yes [place], Holli Humbles, NP  loperamide (IMODIUM A-D) 2 MG tablet Take 1-2 tablets (2-4 mg total) by mouth daily as needed for diarrhea or loose stools. 05/28/16  Yes Hali Marry, MD  mirtazapine (REMERON) 7.5 MG tablet Take 1 tablet (7.5 mg total) by mouth at bedtime. 05/11/18  Yes Nafziger, Tommi Rumps, NP  ondansetron (ZOFRAN ODT) 4 MG disintegrating tablet Take 1 tablet (4 mg total) by mouth every 8 (eight) hours as needed. Patient taking differently: Take 4 mg by mouth every 8 (eight) hours as needed for nausea or vomiting.  01/04/18  Yes Sherwood Gambler, MD  promethazine (PHENERGAN) 25 MG tablet Take 0.5 tablets (12.5 mg total) by mouth every 6 (six) hours as needed for nausea or vomiting. 12/15/17  Yes Ennever, Rudell Cobb, MD  rosuvastatin (CRESTOR) 20 MG tablet TAKE 1 TABLET(20 MG) BY MOUTH DAILY 10/17/17  Yes [name], MD  triamcinolone ointment (KENALOG) 0.5 % Apply 1 application topically 2 (two) times daily. 02/23/18  Yes Nafziger, Tommi Rumps, NP  ACCU-CHEK FASTCLIX LANCETS MISC Use to check blood sugars 3 times daily 05/12/18   [name], MD  Blood Glucose Monitoring Suppl (ACCU-CHEK GUIDE) w/Device KIT 1 kit by Does not apply route as directed. 01/25/18    [name], MD  glucose blood (ACCU-CHEK GUIDE) test strip Use to check blood sugars 3 times daily 01/25/18   [name], MD  hydroxyurea (HYDREA) 500 MG capsule Take 1 capsule (500 mg total) by mouth 2 (two) times daily. May take with food to minimize GI side effects. Patient not taking: Reported on 05/23/2018 04/13/18   Volanda Napoleon, MD  Insulin Pen Needle (BD PEN NEEDLE NANO U/F) 32G X 4 MM MISC USE TO INJECT INSULIN AS DIRECTED. 04/27/18   [name], MD  [facility] VERIO test strip USE TO TEST BLOOD SUGAR THREE TIMES DAILY 05/01/18   [name], MD    Family History Family History  Problem Relation Age of Onset  . Diabetes Mother   . Heart disease Mother        CAD  . Hyperlipidemia Mother   . Hypertension Mother   . Kidney disease Mother   . Kidney disease Father   . Breast cancer Maternal Aunt   . Heart disease Maternal Grandmother   .  Heart disease Other        maternal aunts and uncles    Social History Social History   Tobacco Use  . Smoking status: Former Smoker    Years: 48.00    Types: Cigarettes    Last attempt to quit: 10/18/1986    Years since quitting: 31.6  . Smokeless tobacco: Never Used  Substance Use Topics  . Alcohol use: No    Alcohol/week: 0.0 oz  . Drug use: No     Allergies   Penicillins; Lisinopril; Tape; Doxycycline; and Latex   Review of Systems Review of Systems Review of Systems   Constitutional  Negative for fever  Negative for chills  HENT  Negative for ear pain  Negative for sore throat  Negative for difficultly swallowing  Eyes  Negative for eye pain  Negative for visual disturbance  Respiratory  Negative for shortness of breath  Negative for cough  CV  Negative for chest pain  Negative for leg swelling  Abdomen  +for abdominal pain  +for nausea  Negative for vomiting  MSK  Negative for extremity pain  Negative for back pain  Skin  Negative for rash  Negative for wound  Neuro   Negative for syncope  Negative for difficultly speaking  Psych  Negative for confusion   The remainder of the ROS was reviewed and negative except as documented above.      Physical Exam Updated Vital Signs BP (!) 146/45   Pulse (!) 105   Temp 98.6 F (37 C) (Oral)   Resp 18   Ht _0  (1.626 m)   Wt 96.2 kg (212 lb)   SpO2 97%   BMI 36.39 kg/m   Physical Exam Physical Exam Constitutional  Nursing notes reviewed  Vital signs reviewed  HEENT  No obvious trauma  Supple without meningismus, mass, or overt JVD  EOMI  No scleral icterus or injection  Respiratory  Effort normal  Faint rhonchi  No respiratory distress  CV  Normal rate  No obvious murmurs  Trace pitting edema  Abdomen  Soft  Non-tender to light or deep palpation  Protuberant abdomen  No peritonitis  MSK  Atraumatic  No obvious deformity  ROM appropriate  Skin  Warm  Dry  Neuro  Awake and alert  EOMI  Moving all extremities  Psychiatric  Mood and affect normal        ED Treatments / Results  Labs (all labs ordered are listed, but only abnormal results are displayed) Labs Reviewed  CBC WITH DIFFERENTIAL/PLATELET - Abnormal; Notable for the following components:      Result Value   WBC 46.8 (*)    RBC 2.45 (*)    Hemoglobin 6.2 (*)    HCT 22.1 (*)    MCH 25.3 (*)    MCHC 28.1 (*)    RDW 21.1 (*)    Platelets 703 (*)    Neutro Abs 42.5 (*)    Monocytes Absolute 1.9 (*)    Basophils Absolute 0.5 (*)    All other components within normal limits  BASIC METABOLIC PANEL - Abnormal; Notable for the following components:   Glucose, Bld 210 (*)    Creatinine, Ser 1.16 (*)    Calcium 8.4 (*)    GFR calc non Af Amer 44 (*)    GFR calc Af Amer 51 (*)    All other components within normal limits  HEPATIC FUNCTION PANEL - Abnormal; Notable for the following components:   Total Protein  5.6 (*)    Albumin 3.1 (*)    Total Bilirubin 1.3 (*)    Bilirubin, Direct 0.3  (*)    Indirect Bilirubin 1.0 (*)    All other components within normal limits  URINALYSIS, ROUTINE W REFLEX MICROSCOPIC - Abnormal; Notable for the following components:   APPearance HAZY (*)    Hgb urine dipstick LARGE (*)    Protein, ur 100 (*)    Bacteria, UA RARE (*)    All other components within normal limits  PROTIME-INR - Abnormal; Notable for the following components:   Prothrombin Time 16.2 (*)    All other components within normal limits  POC OCCULT BLOOD, ED - Abnormal; Notable for the following components:   Fecal Occult Bld POSITIVE (*)    All other components within normal limits  C DIFFICILE QUICK SCREEN W PCR REFLEX  LIPASE, BLOOD  TYPE AND SCREEN  PREPARE RBC (CROSSMATCH)  ABO/RH    EKG None  Radiology Ct Abdomen Pelvis W Contrast  Result Date: 05/23/2018 CLINICAL DATA:  Left lower quadrant abdominal pain beginning last night and worsening today. EXAM: CT ABDOMEN AND PELVIS WITH CONTRAST TECHNIQUE: Multidetector CT imaging of the abdomen and pelvis was performed using the standard protocol following bolus administration of intravenous contrast. CONTRAST:  126m ISOVUE-300 IOPAMIDOL (ISOVUE-300) INJECTION 61% COMPARISON:  01/04/2018 FINDINGS: The patient is scanned in the right-side-down decubitus position. lower chest: Negative Hepatobiliary: Chronic cirrhosis of the liver. No acute liver finding. No calcified gallstones. Pancreas: No lesion of the pancreas identified. Spleen: Splenomegaly as seen previously. Adrenals/Urinary Tract: Normal adrenal glands. Normal kidneys. Normal bladder. Stomach/Bowel: No acute bowel pathology is seen. Patient has diverticulosis of the sigmoid colon but I do not see evidence of diverticulitis. No bowel obstruction. Vascular/Lymphatic: Aortic atherosclerosis. No aneurysm. IVC is normal. No retroperitoneal adenopathy. Reproductive: No pelvic mass. Other: Increase in amount of ascites, freely distributed and layering dependently due to the  decubitus positioning. Musculoskeletal: Negative IMPRESSION: Patient is scanned in the right-side-down decubitus position. Marked increase in the amount of ascites, freely distributed within the peritoneal space and layering dependently due to the decubitus positioning. 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 Flow is present in the portal system, but I cannot determine direction by CT. Diverticulosis but no evidence of diverticulitis. Electronically Signed   By: MNelson ChimesM.D.   On: 05/23/2018 16:12    Procedures Procedures (including critical care time)  Medications Ordered in ED Medications  0.9 %  sodium chloride infusion (Manually program via Guardrails IV Fluids) (has no administration in time range)  lactated ringers bolus 500 mL (0 mLs Intravenous Stopped 05/23/18 1201)  iohexol (OMNIPAQUE) 300 MG/ML solution 100 mL (75 mLs Intravenous Contrast Given 05/23/18 1346)  LORazepam (ATIVAN) tablet 0.5 mg (0.5 mg Oral Given 05/23/18 1500)  iopamidol (ISOVUE-300) 61 % injection 100 mL (100 mLs Intravenous Contrast Given 05/23/18 1544)     Initial Impression / Assessment and Plan / ED Course  I have reviewed the triage vital signs and the nursing notes.  Pertinent labs & imaging results that were available during my care of the patient were reviewed by me and considered in my medical decision making (see chart for details).  Clinical Course as of May 23 1610  Tue May 23, 2018  1016 Amanda LOUGHMILLERpresents with left lower quadrant abdominal pain, diarrhea, and nausea as per above.She is hemodynamically stable and well-appearing overall.  She has no abdominal tenderness on either light palpation or deep  palpation.I am most suspicious for a stomach virus or colitis, especially considering her history of colitis.  Diverticulosis/diverticulitis also the differential.  However, I believe these are far less likely given that she has no tenderness on current exam.We will obtain screening  labs.  Given that she also has a history of C. difficile, we will attempt to obtain C. difficile testing if she has diarrhea in the ED.  I do not think that imaging is indicated she has no abdominal pain or tenderness on exam currently.She is given 500 cc of LR.  She is not currently nauseous.   [NA]  1211 Her labs reveal that she is anemic with a hemoglobin of 6.2 and a WBC of 46.8.  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  However, she has never required a blood transfusion before and her white count has never been this high.  Her polycythemia vera medications were recently switched.  Rectal exam revealed no anal fissures, hemorrhoids, or gross blood.  Stool guaiac pending.  I discussed the risk and benefits of blood transfusion.  She consented to be transfused PRBCs.  Her abdominal exam remains reassuring.  She has no tenderness.  She says that she is not having any abdominal pain.  However, she also denies dysuria, hemoptysis, melena/hematochezia, and has a reassuring rectal exam.  Due to her abdominal distention and unclear source of blood loss, we will obtain a CT abdomen/pelvis. She required oral ativan due to anxiety to facilitate this imaging.  UA revealed microscopic hematuria. Lipase not elevated.  CT abdomen/pelvis revealed ascites and diverticulosis but no diverticulitis.  Care transferred to Dr. Rex Kras at 4:34 PM    [NA]    Clinical Course User Index [NA] Alford Highland, MD      Final Clinical Impressions(s) / ED Diagnoses   Final diagnoses:  Generalized abdominal pain  Anemia, unspecified type    ED Discharge Orders    None       Alford Highland, MD 05/23/18 [id number]    Lajean Saver, MD 05/24/18 [phone]

## 2018-05-23 NOTE — Progress Notes (Signed)
Pt. Refused cpap. RN aware.

## 2018-05-23 NOTE — ED Provider Notes (Signed)
I received pt in signout from Dr. Drue Flirt and Dr. Ashok Cordia. We were awaiting call back for admission. I discussed with [facility], Sharene Butters, I appreciate her assistance. Per her instruction, called Heme-Onc office to leave message for Dr. Jonette Eva notifying him of patient admission. Clarise Cruz plans to order 2nd unit of pRBCs. VS stable at time of admission.   Little, Wenda Overland, MD 05/23/18 [phone]

## 2018-05-24 ENCOUNTER — Encounter (HOSPITAL_COMMUNITY): Payer: Self-pay | Admitting: General Practice

## 2018-05-24 DIAGNOSIS — K5730 Diverticulosis of large intestine without perforation or abscess without bleeding: Secondary | ICD-10-CM

## 2018-05-24 DIAGNOSIS — D732 Chronic congestive splenomegaly: Secondary | ICD-10-CM

## 2018-05-24 DIAGNOSIS — R195 Other fecal abnormalities: Secondary | ICD-10-CM

## 2018-05-24 LAB — BASIC METABOLIC PANEL
ANION GAP: 11 (ref 5–15)
BUN: 14 mg/dL (ref 8–23)
CALCIUM: 8.4 mg/dL — AB (ref 8.9–10.3)
CO2: 20 mmol/L — AB (ref 22–32)
Chloride: 109 mmol/L (ref 98–111)
Creatinine, Ser: 1.07 mg/dL — ABNORMAL HIGH (ref 0.44–1.00)
GFR calc Af Amer: 56 mL/min — ABNORMAL LOW (ref >60)
GFR calc non Af Amer: 48 mL/min — ABNORMAL LOW (ref >60)
GLUCOSE: 182 mg/dL — AB (ref 70–99)
POTASSIUM: 4.1 mmol/L (ref 3.5–5.1)
Sodium: 140 mmol/L (ref 135–145)

## 2018-05-24 LAB — GLUCOSE, CAPILLARY
GLUCOSE-CAPILLARY: 161 mg/dL — AB (ref 70–99)
GLUCOSE-CAPILLARY: 153 mg/dL — AB (ref 70–99)
GLUCOSE-CAPILLARY: 173 mg/dL — AB (ref 70–99)
GLUCOSE-CAPILLARY: 168 mg/dL — AB (ref 70–99)

## 2018-05-24 LAB — MRSA PCR SCREENING: MRSA by PCR: POSITIVE — AB

## 2018-05-24 LAB — CBC
HCT: 23.9 % — ABNORMAL LOW (ref 36.0–46.0)
HEMATOCRIT: 24.3 % — AB (ref 36.0–46.0)
HEMOGLOBIN: 7 g/dL — AB (ref 12.0–15.0)
HEMOGLOBIN: 7.1 g/dL — AB (ref 12.0–15.0)
MCH: 26.6 pg (ref 26.0–34.0)
MCH: 26.5 pg (ref 26.0–34.0)
MCHC: 29.2 g/dL — ABNORMAL LOW (ref 30.0–36.0)
MCHC: 29.3 g/dL — ABNORMAL LOW (ref 30.0–36.0)
MCV: 91 fL (ref 78.0–100.0)
MCV: 90.5 fL (ref 78.0–100.0)
PLATELETS: 635 10*3/uL — AB (ref 150–400)
Platelets: 679 10*3/uL — ABNORMAL HIGH (ref 150–400)
RBC: 2.64 MIL/uL — AB (ref 3.87–5.11)
RBC: 2.67 MIL/uL — AB (ref 3.87–5.11)
RDW: 19.6 % — ABNORMAL HIGH (ref 11.5–15.5)
RDW: 20.1 % — ABNORMAL HIGH (ref 11.5–15.5)
WBC: 44.1 10*3/uL — AB (ref 4.0–10.5)
WBC: 48.1 10*3/uL — AB (ref 4.0–10.5)

## 2018-05-24 LAB — TSH: TSH: 3.973 u[IU]/mL (ref 0.350–4.500)

## 2018-05-24 LAB — PROTIME-INR
INR: 1.46
Prothrombin Time: 17.6 seconds — ABNORMAL HIGH (ref 11.4–15.2)

## 2018-05-24 MED ORDER — PANTOPRAZOLE SODIUM 40 MG IV SOLR
40.0000 mg | Freq: Two times a day (BID) | INTRAVENOUS | Status: DC
  Administered 2018-05-24 – 2018-05-27 (×7): 40 mg via INTRAVENOUS
  Filled 2018-05-24 (×9): qty 40

## 2018-05-24 MED ORDER — FUROSEMIDE 10 MG/ML IJ SOLN
40.0000 mg | Freq: Once | INTRAMUSCULAR | Status: AC
  Administered 2018-05-24: 40 mg via INTRAVENOUS
  Filled 2018-05-24: qty 4

## 2018-05-24 MED ORDER — CHLORHEXIDINE GLUCONATE CLOTH 2 % EX PADS
6.0000 | MEDICATED_PAD | Freq: Every day | CUTANEOUS | Status: DC
  Administered 2018-05-25 – 2018-05-27 (×3): 6 via TOPICAL

## 2018-05-24 MED ORDER — MUPIROCIN 2 % EX OINT
1.0000 "application " | TOPICAL_OINTMENT | Freq: Two times a day (BID) | CUTANEOUS | Status: DC
  Administered 2018-05-24 – 2018-05-28 (×8): 1 via NASAL
  Filled 2018-05-24: qty 22

## 2018-05-24 NOTE — Consult Note (Signed)
   [name] CM Inpatient Consult   05/24/2018  Amanda [name] [birth year] [id number]  Patient screened for potential [facility] [place] [facility] services. Patient is in the [place] of the [place] [facility] services under patient's [employer] plan.  Met with the patient and her husband.  Patient is short of breath, states, "I have big problems and waiting on the doctor to come and my husband has to be at work."  Patient was given information regarding attempt to reach her and spoke about post hospital follow up. Patient states her interest is finding someone to help her with getting the house clean. She states, "I am just not able to get things done any more." Explained that [facility] [facility] social worker could assist with resources.  Husband took the information of the brochure and 24 hour nurse advise line magnet.  MD came in and will continue to follow up with the inpatient care management team on transition of care needs.  For questions contact:   Natividad Brood, RN BSN [facility] Liaison  [phone] business mobile phone Toll free office [phone]

## 2018-05-24 NOTE — Progress Notes (Addendum)
PROGRESS NOTE    Amanda [name]  LHT:[id number] DOB: Sep 08, 1941 DOA: 05/23/2018 PCP: [name], NP   Brief Narrative: Amanda [name] is a 77 y.o. 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 difficile  3 years ago, chronic diarrhea on chronic Imodium, presenting with left lower quadrant abdominal pain, first episode about 2 weeks ago, described as sharp left lower quadrant pain, but these symptoms got worse overnight, for which she sought medical attention.  She denies any gross blood in the stools.  She was feeling nauseous on presentation.  She denies any fever, chills, she had some nausea without vomiting, she denies any chest pain or palpitations, she denies any shortness of breath or cough.  She denies any sick contacts or food poisoning.  She is compliant with her medications.  She denies any dysuria, gross hematuria, vaginal bleeding, lower extremity swelling or calf pain.  She does take aspirin a day, and reports being compliant with her medications.  Of note, her Hydrea was modified a few weeks ago.  She is very anxious, easily irritable, does not want to provide any further information, but no acute mental status changes are noted.  She denies any tobacco, alcohol or recreational drug use.  Denies any pruritic rash. Patient found to be anemic on presentation.  FOBT was found to be positive in the emergency department.  Admitted for further management.  Assessment & Plan:   Principal Problem:   Acute blood loss anemia Active Problems:   Hyperlipidemia   Depression   Essential hypertension, benign   DIASTOLIC DYSFUNCTION   PSORIASIS   [name] (HCC)   Thrombocytosis (HCC)   Coronary artery disease involving native coronary artery   Hepatic cirrhosis (HCC)   OSA (obstructive sleep apnea)   Chronic diarrhea   Iron deficiency anemia   CVA (cerebral vascular accident) ([place])   Acute  left lower quadrant pain, with CT of the abdomen confirming diverticulosis without diverticulitis   No other acute abdominal findings are noted except for diverticulosis. She has known history of hepatic cirrhosis and ascites.  Denies abdomen pain this morning.  Start on clear liquid diet.   Chronic diarrhea: C. difficile has been sent on admission.She has history of chronic diarrhea.  Continue with her Imodium, as well as Creon.  Anemia, likely of blood loss   Per EDP on digital rectal exam stool was positive.  Hemoglobin was 6.2,  S/P 2 units of PRBC .Hemoglobin this morning 7.1 Patient denies any bloody stools or frank bleeding. Famotidine 70m IV BID  Hold all NSAIDs including preadmission baby aspirin GI consulted.  Lower extremity edema  We will discontinue IV fluids and give her a dose of Lasix 40 mg IV once  [name]  Jak 2+, On Hydrea . Ennever has been notified of the patient's admission.  For now, we will  Hold  Hydrea  She has severe leukocytosis and thrombocytosis  OSA Continue CPAP nightly  Type II Diabetes with neuropathy  SSI  Continue Neurontin  Hyperlipidemia Continue home statins   Hypertension  Hold meds for now, in view of significant anemia,which may lower her blood pressure even further.  Anxiety and depression Continue Remeron  Difficulty in  Ambulation/debility/deconditioning  We will request for physical therapy evaluation.  DVT prophylaxis: SCD Code Status: DNR Family Communication: Husband present at the bedside Disposition Plan: Pending work-up   Consultants: Gastroenterology, hematology  Procedures: None  Antimicrobials: None  Subjective: Patient seen and examined the bedside this morning.  Hemodynamically stable.  Denies any abdominal pain, chest pain or shortness of breath.  No new bloody bowel movements.  Objective: Vitals:   05/23/18 2215 05/23/18 2245 05/23/18 2336 05/24/18 0541  BP: 92/66 (!) 144/53 (!)  152/57 (!) 146/55  Pulse: 99 98 (!) 103 93  Resp: (!) 22 (!) 26 (!) 22 (!) 22  Temp:   99.3 F (37.4 C) 99.3 F (37.4 C)  TempSrc:      SpO2: 94% 96% 95% 98%  Weight:   102.3 kg (225 lb 8.5 oz)   Height:        Intake/Output Summary (Last 24 hours) at 05/24/2018 1225 Last data filed at 05/24/2018 1100 Gross per 24 hour  Intake 1433.56 ml  Output 550 ml  Net 883.56 ml   Filed Weights   05/23/18 0941 05/23/18 2336  Weight: 96.2 kg (212 lb) 102.3 kg (225 lb 8.5 oz)    Examination:  General exam: Anxious ,Not in distress,average built HEENT:PERRL,Oral mucosa moist, Ear/Nose normal on gross exam Respiratory system: Bilateral equal air entry, normal vesicular breath sounds, no wheezes or crackles .  Occasional wheezes Cardiovascular system: S1 & S2 heard, RRR. No JVD, murmurs, rubs, gallops or clicks. 1-2 +pedal edema. Gastrointestinal system: Abdomen is distended, soft and nontender. No organomegaly or masses felt. Normal bowel sounds heard. Central nervous system: Alert and oriented. No focal neurological deficits. Extremities: 1-2 + edema, no clubbing ,no cyanosis, distal peripheral pulses palpable. Skin: No rashes, lesions or ulcers,no icterus ,no pallor MSK: Normal muscle bulk,tone ,power  Data Reviewed: I have personally reviewed following labs and imaging studies  CBC: Recent Labs  Lab 05/23/18 1042 05/23/18 2245 05/24/18 0025 05/24/18 0651  WBC 46.8* 46.9* 44.1* 48.1*  NEUTROABS 42.5*  --   --   --   HGB 6.2* 7.8* 7.0* 7.1*  HCT 22.1* 26.8* 23.9* 24.3*  MCV 90.2 92.1 90.5 91.0  PLT 703* 697* 635* 324*   Basic Metabolic Panel: Recent Labs  Lab 05/23/18 1042 05/24/18 0651  NA 141 140  K 4.2 4.1  CL 110 109  CO2 22 20*  GLUCOSE 210* 182*  BUN 15 14  CREATININE 1.16* 1.07*  CALCIUM 8.4* 8.4*   GFR: Estimated Creatinine Clearance: 50.4 mL/min (A) (by C-G formula based on SCr of 1.07 mg/dL (H)). Liver Function Tests: Recent Labs  Lab 05/23/18 1042  AST  23  ALT 9  ALKPHOS 119  BILITOT 1.3*  PROT 5.6*  ALBUMIN 3.1*   Recent Labs  Lab 05/23/18 1042  LIPASE 41   No results for input(s): AMMONIA in the last 168 hours. Coagulation Profile: Recent Labs  Lab 05/23/18 1317  INR 1.32   Cardiac Enzymes: No results for input(s): CKTOTAL, CKMB, CKMBINDEX, TROPONINI in the last 168 hours. BNP (last 3 results) No results for input(s): PROBNP in the last 8760 hours. HbA1C: No results for input(s): HGBA1C in the last 72 hours. CBG: Recent Labs  Lab 05/23/18 2339 05/24/18 0751 05/24/18 1143  GLUCAP 151* 161* 153*   Lipid Profile: No results for input(s): CHOL, HDL, LDLCALC, TRIG, CHOLHDL, LDLDIRECT in the last 72 hours. Thyroid Function Tests: Recent Labs    05/23/18 2245  TSH 3.973   Anemia Panel: No results for input(s): VITAMINB12, FOLATE, FERRITIN, TIBC, IRON, RETICCTPCT in the last 72 hours. Sepsis Labs: No results for input(s): PROCALCITON, LATICACIDVEN in the last 168 hours.  Recent Results (from the past 240 hour(s))  MRSA PCR  Screening     Status: Abnormal   Collection Time: 05/23/18 11:51 PM  Result Value Ref Range Status   MRSA by PCR POSITIVE (A) NEGATIVE Final    Comment:        The GeneXpert MRSA Assay (FDA approved for NASAL specimens only), is one component of a comprehensive MRSA colonization surveillance program. It is not intended to diagnose MRSA infection nor to guide or monitor treatment for MRSA infections. RESULT CALLED TO, READ BACK BY AND VERIFIED WITH: Toy Cookey RN [id number] 05/24/18 A BROWNING Performed at [facility], [place] [address] [place],  [address]          Radiology Studies: Ct Abdomen Pelvis W Contrast  Result Date: 05/23/2018 CLINICAL DATA:  Left lower quadrant abdominal pain beginning last night and worsening today. EXAM: CT ABDOMEN AND PELVIS WITH CONTRAST TECHNIQUE: Multidetector CT imaging of the abdomen and pelvis was performed using the standard protocol  following bolus administration of intravenous contrast. CONTRAST:  11m ISOVUE-300 IOPAMIDOL (ISOVUE-300) INJECTION 61% COMPARISON:  01/04/2018 FINDINGS: The patient is scanned in the right-side-down decubitus position. lower chest: Negative Hepatobiliary: Chronic cirrhosis of the liver. No acute liver finding. No calcified gallstones. Pancreas: No lesion of the pancreas identified. Spleen: Splenomegaly as seen previously. Adrenals/Urinary Tract: Normal adrenal glands. Normal kidneys. Normal bladder. Stomach/Bowel: No acute bowel pathology is seen. Patient has diverticulosis of the sigmoid colon but I do not see evidence of diverticulitis. No bowel obstruction. Vascular/Lymphatic: Aortic atherosclerosis. No aneurysm. IVC is normal. No retroperitoneal adenopathy. Reproductive: No pelvic mass. Other: Increase in amount of ascites, freely distributed and layering dependently due to the decubitus positioning. Musculoskeletal: Negative IMPRESSION: Patient is scanned in the right-side-down decubitus position. Marked increase in the amount of ascites, freely distributed within the peritoneal space and layering dependently due to the decubitus positioning. 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 Flow is present in the portal system, but I cannot determine direction by CT. Diverticulosis but no evidence of diverticulitis. Electronically Signed   By: MNelson ChimesM.D.   On: 05/23/2018 16:12        Scheduled Meds: . furosemide  40 mg Intravenous Once  . gabapentin  100 mg Oral BID  . insulin aspart  0-9 Units Subcutaneous TID WC  . lipase/protease/amylase  36,000 Units Oral TID WC  . rosuvastatin  20 mg Oral q1800   Continuous Infusions: . famotidine (PEPCID) IV Stopped (05/23/18 2307)     LOS: 1 day    Time spent:35 mins. More than 50% of that time was spent in counseling and/or coordination of care.      AShelly Coss MD [facility] Pager [phone] If 7PM-7AM,  please contact night-coverage [email or web address] Password [facility]8/04/2018, 12:25 PM

## 2018-05-24 NOTE — Consult Note (Addendum)
Consultation  Referring Provider: Shelly Coss, MD    Primary Care Physician:  [name], NP Primary Gastroenterologist:         Reason for Consultation: Symptomatic anemia             HPI:   Amanda [name] is a 77 y.o. 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 difficile (3 years ago), chronic diarrhea (on  Imodium) who presented with lower abdominal pain for the past 3 days and weakness. Patient was recently discharged from rehab where she was receiving therapy after having to CVA last month.  Since coming home patient reports that she has been progressively getting weaker.  Patient denies any blood in her stool or hematemesis.  Patient denies any melena.  She also became increasingly short of breath and started experiencing dizziness.  Patient has chronic diarrhea and denies any increasing bowel movement frequency.  She denies any recent antibiotic use.  Patient denies any nausea and vomiting.  No history of gastric varices. On admission, patient's hemoglobin was 6.2.  Patient is on hydroxyurea for polycythemia vera.  Patient also has significant leukocytosis with left shift.  CT abdomen pelvis showed evidence of diverticulosis without any signs of diverticulitis.  Patient also had ascites consistent with liver cirrhosis secondary to polycythemia vera. Patient was FOBT positive in the ED.     Past Medical History:  Diagnosis Date  . Allergic rhinitis 01/23/2016  . C. difficile colitis   . CAD (coronary artery disease)    a. s/p STEMI in 01/2015 with 95% LCx stenosis and distal 80% LCx stenosis (DESx2 placed)  . Cancer ([place])    SKIN  . Candidiasis of skin 09/30/2014  . Cirrhosis ([place])   . Depression   . Diabetes mellitus 2008  . Gout   . Herpes simplex   . Hyperglycemia 05/31/2013  . Hyperlipidemia   . Hypertension   . Myocardial infarction ([place])   . 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 Obesity   . Polycythemia    Dr. Elease Hashimoto- [facility]  . Psoriasis     Past Surgical History:  Procedure Laterality Date  . BREAST SURGERY Left    milk duct  . CATARACT EXTRACTION, BILATERAL    . COLONOSCOPY    . LEFT HEART CATHETERIZATION WITH CORONARY ANGIOGRAM N/A 01/27/2015   Procedure: LEFT HEART CATHETERIZATION WITH CORONARY ANGIOGRAM;  Surgeon: [name], MD;  Location: [facility] CATH LAB;  Service: Cardiovascular;  Laterality: N/A;  . TONSILLECTOMY    . TOTAL ABDOMINAL HYSTERECTOMY W/ BILATERAL SALPINGOOPHORECTOMY     for heavy periods with appendectomy    Family History  Problem Relation Age of Onset  . Diabetes Mother   . Heart disease Mother        CAD  . Hyperlipidemia Mother   . Hypertension Mother   . Kidney disease Mother   . Kidney disease Father   . Breast cancer Maternal Aunt   . Heart disease Maternal Grandmother   . Heart disease Other        maternal aunts and uncles     Social History   Tobacco Use  . Smoking status: Former Smoker    Years: 48.00    Types: Cigarettes    Last attempt to quit: 10/18/1986    Years since quitting: 31.6  . Smokeless tobacco: Never Used  Substance Use Topics  . Alcohol use: No  Alcohol/week: 0.0 oz  . Drug use: No    Prior to Admission medications   Medication Sig Start Date End Date Taking? Authorizing Provider  amLODipine (NORVASC) 10 MG tablet Take 1 tablet (10 mg total) by mouth daily. 04/06/18  Yes Nafziger, Tommi Rumps, NP  aspirin 325 MG tablet Take 1 tablet (325 mg total) by mouth daily. 03/27/18  Yes Donne Hazel, MD  diphenoxylate-atropine (LOMOTIL) 2.5-0.025 MG tablet Take 1-2 tablets by mouth 2 (two) times daily as needed for diarrhea or loose stools. 01/05/18  Yes Nafziger, Tommi Rumps, NP  gabapentin (NEURONTIN) 100 MG capsule Take 1 capsule (100 mg total) by mouth 2 (two) times daily. 03/26/18  Yes Donne Hazel, MD  lipase/protease/amylase (CREON) 36000 UNITS CPEP capsule Take 1 capsule (36,000  Units total) by mouth 3 (three) times daily with meals. 03/14/18  Yes [place], Holli Humbles, NP  loperamide (IMODIUM A-D) 2 MG tablet Take 1-2 tablets (2-4 mg total) by mouth daily as needed for diarrhea or loose stools. 05/28/16  Yes Hali Marry, MD  mirtazapine (REMERON) 7.5 MG tablet Take 1 tablet (7.5 mg total) by mouth at bedtime. 05/11/18  Yes Nafziger, Tommi Rumps, NP  ondansetron (ZOFRAN ODT) 4 MG disintegrating tablet Take 1 tablet (4 mg total) by mouth every 8 (eight) hours as needed. Patient taking differently: Take 4 mg by mouth every 8 (eight) hours as needed for nausea or vomiting.  01/04/18  Yes Sherwood Gambler, MD  promethazine (PHENERGAN) 25 MG tablet Take 0.5 tablets (12.5 mg total) by mouth every 6 (six) hours as needed for nausea or vomiting. 12/15/17  Yes Ennever, Rudell Cobb, MD  rosuvastatin (CRESTOR) 20 MG tablet TAKE 1 TABLET(20 MG) BY MOUTH DAILY 10/17/17  Yes [name], MD  triamcinolone ointment (KENALOG) 0.5 % Apply 1 application topically 2 (two) times daily. 02/23/18  Yes Nafziger, Tommi Rumps, NP  ACCU-CHEK FASTCLIX LANCETS MISC Use to check blood sugars 3 times daily 05/12/18   [name], MD  Blood Glucose Monitoring Suppl (ACCU-CHEK GUIDE) w/Device KIT 1 kit by Does not apply route as directed. 01/25/18   [name], MD  glucose blood (ACCU-CHEK GUIDE) test strip Use to check blood sugars 3 times daily 01/25/18   [name], MD  hydroxyurea (HYDREA) 500 MG capsule Take 1 capsule (500 mg total) by mouth 2 (two) times daily. May take with food to minimize GI side effects. Patient not taking: Reported on 05/23/2018 04/13/18   Volanda Napoleon, MD  Insulin Pen Needle (BD PEN NEEDLE NANO U/F) 32G X 4 MM MISC USE TO INJECT INSULIN AS DIRECTED. 04/27/18   [name], MD  [facility] VERIO test strip USE TO TEST BLOOD SUGAR THREE TIMES DAILY 05/01/18   [name], MD    Current Facility-Administered Medications  Medication Dose Route Frequency Provider Last  Rate Last Dose  . acetaminophen (TYLENOL) tablet 650 mg  650 mg Oral Q6H PRN Rondel Jumbo, PA-C       Or  . acetaminophen (TYLENOL) suppository 650 mg  650 mg Rectal Q6H PRN Rondel Jumbo, PA-C      . diphenoxylate-atropine (LOMOTIL) 2.5-0.025 MG per tablet 1-2 tablet  1-2 tablet Oral BID PRN Rondel Jumbo, PA-C      . famotidine (PEPCID) IVPB 20 mg premix  20 mg Intravenous QHS Rondel Jumbo, PA-C   Stopped at 05/23/18 2307  . gabapentin (NEURONTIN) capsule 100 mg  100 mg Oral BID Rondel Jumbo, PA-C      .  insulin aspart (novoLOG) injection 0-9 Units  0-9 Units Subcutaneous TID WC Rondel Jumbo, PA-C   2 Units at 05/24/18 1314  . lipase/protease/amylase (CREON) capsule 36,000 Units  36,000 Units Oral TID WC Rondel Jumbo, PA-C   36,000 Units at 05/24/18 1313  . morphine 2 MG/ML injection 1 mg  1 mg Intravenous Q4H PRN Rondel Jumbo, PA-C      . ondansetron (ZOFRAN) tablet 4 mg  4 mg Oral Q6H PRN Rondel Jumbo, PA-C       Or  . ondansetron (ZOFRAN) injection 4 mg  4 mg Intravenous Q6H PRN Rondel Jumbo, PA-C      . rosuvastatin (CRESTOR) tablet 20 mg  20 mg Oral q1800 Rondel Jumbo, PA-C   20 mg at 05/23/18 2055    Allergies as of 05/23/2018 - Review Complete 05/23/2018  Allergen Reaction Noted  . Penicillins Swelling 05/23/2018  . Lisinopril Cough 04/08/2015  . Tape Hives 01/28/2014  . Doxycycline Hives, Swelling, and Rash   . Latex Hives, Itching, and Rash 09/07/2011     Review of Systems:    As per HPI, otherwise negative    Physical Exam:  Vital signs in last 24 hours: Temp:  [98.6 F (37 C)-99.3 F (37.4 C)] 99.3 F (37.4 C) (08/07 0541) Pulse Rate:  [93-105] 93 (08/07 0541) Resp:  [18-26] 22 (08/07 0541) BP: (92-152)/(45-99) 146/55 (08/07 0541) SpO2:  [94 %-98 %] 98 % (08/07 0541) Weight:  [225 lb 8.5 oz (102.3 kg)] 225 lb 8.5 oz (102.3 kg) (08/06 2336) Last BM Date: 05/23/18 General: Tired appearing, in NAD Head:  Normocephalic and  atraumatic. Eyes:   No icterus.   Conjunctiva pink. Ears:  Normal auditory acuity. Neck:  Supple Lungs: Respirations even and mildly labored. Lung exam with moderate wheezes, no crackles, or rhonchi.  Heart:  Regular rate and rhythm; no MRG Abdomen:  Soft, distended, mildly tender to palpation in the lower abdomen. Normal bowel sounds.  Restricted due to body habitus rectal:  Not performed.  Msk:  Symmetrical without gross deformities.  Extremities:  Without edema. Neurologic:  Alert and  oriented x4;  grossly normal neurologically. Skin:  Intact without significant lesions or rashes. Psych:  Alert and cooperative. Normal affect.  LAB RESULTS: Recent Labs    05/23/18 2245 05/24/18 0025 05/24/18 0651  WBC 46.9* 44.1* 48.1*  HGB 7.8* 7.0* 7.1*  HCT 26.8* 23.9* 24.3*  PLT 697* 635* 679*   BMET Recent Labs    05/23/18 1042 05/24/18 0651  NA 141 140  K 4.2 4.1  CL 110 109  CO2 22 20*  GLUCOSE 210* 182*  BUN 15 14  CREATININE 1.16* 1.07*  CALCIUM 8.4* 8.4*   LFT Recent Labs    05/23/18 1042  PROT 5.6*  ALBUMIN 3.1*  AST 23  ALT 9  ALKPHOS 119  BILITOT 1.3*  BILIDIR 0.3*  IBILI 1.0*   PT/INR Recent Labs    05/23/18 1317  LABPROT 16.2*  INR 1.32    STUDIES: Ct Abdomen Pelvis W Contrast  Result Date: 05/23/2018 CLINICAL DATA:  Left lower quadrant abdominal pain beginning last night and worsening today. EXAM: CT ABDOMEN AND PELVIS WITH CONTRAST TECHNIQUE: Multidetector CT imaging of the abdomen and pelvis was performed using the standard protocol following bolus administration of intravenous contrast. CONTRAST:  173m ISOVUE-300 IOPAMIDOL (ISOVUE-300) INJECTION 61% COMPARISON:  01/04/2018 FINDINGS: The patient is scanned in the right-side-down decubitus position. lower chest: Negative Hepatobiliary: Chronic cirrhosis of the liver.  No acute liver finding. No calcified gallstones. Pancreas: No lesion of the pancreas identified. Spleen: Splenomegaly as seen previously.  Adrenals/Urinary Tract: Normal adrenal glands. Normal kidneys. Normal bladder. Stomach/Bowel: No acute bowel pathology is seen. Patient has diverticulosis of the sigmoid colon but I do not see evidence of diverticulitis. No bowel obstruction. Vascular/Lymphatic: Aortic atherosclerosis. No aneurysm. IVC is normal. No retroperitoneal adenopathy. Reproductive: No pelvic mass. Other: Increase in amount of ascites, freely distributed and layering dependently due to the decubitus positioning. Musculoskeletal: Negative IMPRESSION: Patient is scanned in the right-side-down decubitus position. Marked increase in the amount of ascites, freely distributed within the peritoneal space and layering dependently due to the decubitus positioning. 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 Flow is present in the portal system, but I cannot determine direction by CT. Diverticulosis but no evidence of diverticulitis. Electronically Signed   By: Nelson Chimes M.D.   On: 05/23/2018 16:12     PREVIOUS ENDOSCOPIES:  EGD 02/2014 no portal hypertension changes           Colonoscopy, 2012 multiple sessile polyps reviewed , 2016?   Impression / Plan:   Symptpomatic anemia concerning for GI bleed Patient is a 77 yo female who presented with abdominal pain, weakness and SOB and found to be anemic with a hemoglobin of 6.2 and FOBT positive. Patient has denies any BRBPR, melena or hematemesis. History of C.diff (3 years ago) confounded by her chronic diarrhea but no change in BM frequency. Patient received 2 units of pRBCs still Hbg is in the low 7.0. FOBT positive no blood in stool while inpatient. Evidence of diverticulosis without any diverticulitis. Last EGD in 2015 no evidence of varices. Given positive FOBT and anemia on presentation concern for possible GI bleed though no clear evidence of that based on history. Will continue to manage conservatively. --IV protonix 40 mg bid --Will likely need EGD +/-  colonoscopy --Monitor Hemoglobin transfusion threshold 8.0 with hx of CAD, however will hold for now after receiving 2 units --Liquid diet --F/u on C. Diff panel, no immodium --GI panel pending    Thanks   LOS: 1 day   Abdoulaye Diallo  05/24/2018, 1:34 PM    Attending physician's note   I have taken an interval history, reviewed the chart and examined the patient. I agree with Dr Carmelina Dane note, impression and recommendations.   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 difficile, chronic diarrhea, recent CVA last month, sleep apnea, morbid obesity admitted with heme positive stools and symptomatic anemia with hemoglobin 6.2 status post 2 units of PRBC to hemoglobin 7.  No overt GI bleeding.  EGD 02/2014-negative for varices or portal hypertensive gastropathy.  Colonoscopy 2012-multiple small sessile polyps. CT 05/23/2018 liver cirrhosis with ascites with chronic splenomegaly, diverticulosis. Normal AFP. Plan: IV PPIs, check C. Difficile, GI pathogens, trend CBC, EGD near discharge.  May need colonoscopy as well.  Check NH3, PT.  Will need diuretics, fluid and salt restricted diet.  Will follow along.  Carmell Austria, MD

## 2018-05-24 NOTE — Evaluation (Signed)
Occupational Therapy Evaluation Patient Details Name: Amanda [name] MRN: [id number] DOB: 01/15/1941 Today's Date: 05/24/2018    History of Present Illness Patient is a 77 y/o female presenting with symptomatic anemia on 05/23/18. 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    Clinical Impression   Patient presenting with decreased I in self care, balance, functional transfers, balance, strengthening, and safety awareness. Her husband reports she was caring for herself up until about 4 weeks ago. Patient currently functioning min +2 for functional transfers and min - max A for self care.. Patient will benefit from acute OT to increase overall independence in the areas of ADLs, functional mobility, and safety awareness in order to safely discharge to next venue of care.    Follow Up Recommendations  SNF;Supervision/Assistance - 24 hour    Equipment Recommendations  Other (comment)(defer to next venue of care)    Recommendations for Other Services       Precautions / Restrictions Precautions Precautions: Fall Restrictions Weight Bearing Restrictions: No      Mobility Bed Mobility Overal bed mobility: Needs Assistance Bed Mobility: Supine to Sit     Supine to sit: Supervision     General bed mobility comments: for general safety  Transfers Overall transfer level: Needs assistance Equipment used: Rolling walker (2 wheeled) Transfers: Sit to/from [employer] Sit to Stand: Min assist;+2 physical assistance Stand pivot transfers: Min assist;+2 physical assistance       General transfer comment: Min A to power up at bedside; close guarding for safety and stability    Balance Overall balance assessment: Mild deficits observed, not formally tested          ADL either performed or assessed with clinical judgement   ADL Overall ADL's :  Needs assistance/impaired     Grooming: Set up   Upper Body Bathing: Minimal assistance;Sitting   Lower Body Bathing: Moderate assistance   Upper Body Dressing : Minimal assistance;Sitting   Lower Body Dressing: Maximal assistance   Toilet Transfer: Minimal assistance;RW;+2 for safety/equipment          Vision Baseline Vision/History: No visual deficits Patient Visual Report: No change from baseline              Pertinent Vitals/Pain Pain Assessment: No/denies pain     Hand Dominance Right   Extremity/Trunk Assessment Upper Extremity Assessment Upper Extremity Assessment: Generalized weakness   Lower Extremity Assessment Lower Extremity Assessment: Defer to PT evaluation   Cervical / Trunk Assessment Cervical / Trunk Assessment: Kyphotic   Communication Communication Communication: No difficulties   Cognition Arousal/Alertness: Awake/alert Behavior During Therapy: WFL for tasks assessed/performed;Restless;Anxious Overall Cognitive Status: Within Functional Limits for tasks assessed                    Home Living Family/patient expects to be discharged to:: Private residence Living Arrangements: Spouse/significant other Available Help at Discharge: Family;Available PRN/intermittently Type of Home: Apartment Home Access: Level entry     Home Layout: One level     Bathroom Shower/Tub: Teacher, early years/pre: Handicapped height     Home Equipment: Environmental consultant - 2 wheels;Cane - single point          Prior Functioning/Environment Level of Independence: Independent with assistive device(s);Needs assistance  Gait / Transfers Assistance Needed: has been using walker, but with difficulty ADL's / Homemaking Assistance Needed: husband helps with  ADLs and IADls   Comments: husband reports she has been home for 4 weeks from [facility] and has been "going downhill because she stays in bed."        OT Problem List: Decreased  strength;Impaired balance (sitting and/or standing);Decreased cognition;Pain;Decreased safety awareness;Decreased activity tolerance;Decreased coordination      OT Treatment/Interventions: Self-care/ADL training;Manual therapy;Therapeutic exercise;Patient/family education;Balance training;Neuromuscular education;Energy conservation;Therapeutic activities;DME and/or AE instruction    OT Goals(Current goals can be found in the care plan section) Acute Rehab OT Goals Patient Stated Goal: regain strength OT Goal Formulation: With patient/family Time For Goal Achievement: 06/07/18 Potential to Achieve Goals: Fair ADL Goals Pt Will Perform Lower Body Bathing: with supervision Pt Will Perform Lower Body Dressing: with supervision Pt Will Transfer to Toilet: with supervision Pt Will Perform Toileting - Clothing Manipulation and hygiene: with supervision  OT Frequency: Min 2X/week   Barriers to D/C: Other (comment)  none known at this time       Co-evaluation PT/OT/SLP Co-Evaluation/Treatment: Yes Reason for Co-Treatment: For patient/therapist safety;To address functional/ADL transfers PT goals addressed during session: Mobility/safety with mobility;Proper use of DME OT goals addressed during session: ADL's and self-care      AM-PAC PT "6 Clicks" Daily Activity     Outcome Measure Help from another person eating meals?: A Little Help from another person taking care of personal grooming?: A Little Help from another person toileting, which includes using toliet, bedpan, or urinal?: A Lot Help from another person bathing (including washing, rinsing, drying)?: A Little Help from another person to put on and taking off regular upper body clothing?: A Little Help from another person to put on and taking off regular lower body clothing?: A Lot 6 Click Score: 16   End of Session Equipment Utilized During Treatment: Rolling walker;Oxygen;Other (comment)(3L O2 via [place])  Activity Tolerance:  Patient limited by fatigue Patient left: in chair;with call bell/phone within reach;with chair alarm set;with family/visitor present  OT Visit Diagnosis: Unsteadiness on feet (R26.81);Muscle weakness (generalized) (M62.81)                Time: 1333(co- eval)-1353 OT Time Calculation (min): 20 min Charges:  OT General Charges $OT Visit: 1 Visit OT Evaluation $OT Eval Moderate Complexity: 1 Mod(10 min)   Macall Mccroskey P 05/24/2018, 3:34 PM

## 2018-05-24 NOTE — Progress Notes (Signed)
NURSING PROGRESS NOTE  Amanda [name] [id number] Admission Data: 05/24/2018 12:03 AM Attending Provider: Kayleen Memos, DO [facility]:[id number], Tommi Rumps, NP Code Status: DNR  Allergies:  Penicillins; Lisinopril; Tape; Doxycycline; and Latex Past Medical History:   has a past medical history of Allergic rhinitis (01/23/2016), C. difficile colitis, CAD (coronary artery disease), Cancer ([place]), Candidiasis of skin (09/30/2014), Cirrhosis ([place]), Depression, Diabetes mellitus (2008), Gout, Herpes simplex, Hyperglycemia (05/31/2013), Hyperlipidemia, Hypertension, Myocardial infarction [facility]), Neuromuscular disorder ([place]), Obesity, Polycythemia, and Psoriasis. Past Surgical History:   has a past surgical history that includes Breast surgery (Left); Total abdominal hysterectomy w/ bilateral salpingoophorectomy; Tonsillectomy; Colonoscopy; left heart catheterization with coronary angiogram (N/A, 01/27/2015); and Cataract extraction, bilateral. Social History:   reports that she quit smoking about 31 years ago. Her smoking use included cigarettes. She quit after 48.00 years of use. She has never used smokeless tobacco. She reports that she does not drink alcohol or use drugs.  Amanda [name] is a 77 y.o. female patient admitted from ED:   Last Documented Vital Signs: Blood pressure (!) 152/57, pulse (!) 103, temperature 99.3 F (37.4 C), resp. rate (!) 22, height 5' 4"  (1.626 m), weight 102.3 kg (225 lb 8.5 oz), SpO2 95 %.  Cardiac Monitoring: Box # 09 in place. Cardiac monitor yields:normal sinus rhythm.  IV Fluids:  IV in place, occlusive dsg intact without redness, IV cath forearm right, condition patent and no redness normal saline.   Skin: MSAD noted to breast, edema to BLE, and otherwise skin dry and intact  Patient/Family orientated to room. Information packet given to patient/family. Admission inpatient armband information verified with patient/family to include name and date of birth and placed on patient arm.  Side rails up x 2, fall assessment and education completed with patient/family. Patient/family able to verbalize understanding of risk associated with falls and verbalized understanding to call for assistance before getting out of bed. Call light within reach. Patient/family able to voice and demonstrate understanding of unit orientation instructions.    Will continue to evaluate and treat per MD orders.   Amaryllis Dyke, RN

## 2018-05-24 NOTE — Evaluation (Signed)
Physical Therapy Evaluation Patient Details Name: Amanda [name] MRN: [id number] DOB: 1941/10/05 Today's Date: 05/24/2018   History of Present Illness  Patient is a 77 y/o female presenting with symptomatic anemia on 05/23/18. 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     Clinical Impression  Amanda [name] admitted with the above listed diagnosis. Patient reports recent hospital admission due to "mimi-strokes" where she then went to SNF for rehab, however, feels as if she was d/c home "too early" - at home requiring assist for mobility and ADLs from husband. Patient today with SOB on exertion with only bed mobility and transfers requiring consistent verbal cueing for pursed lip breathing. Deferred further mobility due to fatigue and SOB. PT to recommend SNF at discharge to progress safe and independent functional mobility for safe return home. PT to follow acutely.      Follow Up Recommendations SNF;Supervision/Assistance - 24 hour    Equipment Recommendations  None recommended by PT    Recommendations for Other Services OT consult     Precautions / Restrictions Precautions Precautions: Fall Restrictions Weight Bearing Restrictions: No      Mobility  Bed Mobility Overal bed mobility: Needs Assistance Bed Mobility: Supine to Sit     Supine to sit: Supervision     General bed mobility comments: for general safety  Transfers Overall transfer level: Needs assistance Equipment used: Rolling walker (2 wheeled) Transfers: Sit to/from [employer] Transfers Sit to Stand: Min assist;+2 physical assistance Stand pivot transfers: Min assist;+2 physical assistance       General transfer comment: Min A to power up at bedside; close guarding for safety and stability  Ambulation/Gait             General Gait Details: deferred as patient with SOB on  exertion with bed mobility and transfers  Stairs            Wheelchair Mobility    Modified Rankin (Stroke Patients Only)       Balance Overall balance assessment: Mild deficits observed, not formally tested                                           Pertinent Vitals/Pain Pain Assessment: No/denies pain    Home Living Family/patient expects to be discharged to:: Private residence Living Arrangements: Spouse/significant other Available Help at Discharge: Family;Available PRN/intermittently Type of Home: Apartment Home Access: Level entry     Home Layout: One level Home Equipment: Walker - 2 wheels;Cane - single point      Prior Function Level of Independence: Independent with assistive device(s);Needs assistance   Gait / Transfers Assistance Needed: has been using walker, but with difficulty  ADL's / Homemaking Assistance Needed: husband helps with ADLs and IADls        Hand Dominance   Dominant Hand: Right    Extremity/Trunk Assessment   Upper Extremity Assessment Upper Extremity Assessment: Defer to OT evaluation    Lower Extremity Assessment Lower Extremity Assessment: Generalized weakness    Cervical / Trunk Assessment Cervical / Trunk Assessment: Kyphotic  Communication   Communication: No difficulties  Cognition Arousal/Alertness: Awake/alert Behavior During Therapy: WFL for tasks assessed/performed;Restless;Anxious Overall Cognitive Status: Within Functional Limits for tasks assessed  General Comments      Exercises     Assessment/Plan    PT Assessment Patient needs continued PT services  PT Problem List Decreased strength;Decreased activity tolerance;Decreased balance;Decreased mobility;Decreased knowledge of use of DME;Decreased safety awareness       PT Treatment Interventions DME instruction;Gait training;Functional mobility training;Therapeutic  activities;Therapeutic exercise;Balance training;Neuromuscular re-education;Patient/family education    PT Goals (Current goals can be found in the Care Plan section)  Acute Rehab PT Goals Patient Stated Goal: regain strength PT Goal Formulation: With patient Time For Goal Achievement: 06/07/18 Potential to Achieve Goals: Fair    Frequency Min 2X/week   Barriers to discharge        Co-evaluation PT/OT/SLP Co-Evaluation/Treatment: Yes Reason for Co-Treatment: For patient/therapist safety;To address functional/ADL transfers PT goals addressed during session: Mobility/safety with mobility;Proper use of DME         AM-PAC PT "6 Clicks" Daily Activity  Outcome Measure Difficulty turning over in bed (including adjusting bedclothes, sheets and blankets)?: A Lot Difficulty moving from lying on back to sitting on the side of the bed? : A Lot Difficulty sitting down on and standing up from a chair with arms (e.g., wheelchair, bedside commode, etc,.)?: Unable Help needed moving to and from a bed to chair (including a wheelchair)?: A Little Help needed walking in hospital room?: A Lot Help needed climbing 3-5 steps with a railing? : Total 6 Click Score: 11    End of Session Equipment Utilized During Treatment: Gait belt;Oxygen Activity Tolerance: Patient tolerated treatment well Patient left: in chair;with call bell/phone within reach;with chair alarm set;with family/visitor present;with nursing/sitter in room Nurse Communication: Mobility status PT Visit Diagnosis: Unsteadiness on feet (R26.81);Other abnormalities of gait and mobility (R26.89);Muscle weakness (generalized) (M62.81)    Time: 7062-3762 PT Time Calculation (min) (ACUTE ONLY): 20 min   Charges:   PT Evaluation $PT Eval Moderate Complexity: 1 Mod         Lanney Gins, PT, DPT 05/24/18 2:20 PM Pager: [phone]

## 2018-05-25 ENCOUNTER — Inpatient Hospital Stay (HOSPITAL_COMMUNITY): Payer: PPO

## 2018-05-25 ENCOUNTER — Encounter (HOSPITAL_COMMUNITY): Payer: Self-pay | Admitting: Radiology

## 2018-05-25 DIAGNOSIS — K5781 Diverticulitis of intestine, part unspecified, with perforation and abscess with bleeding: Secondary | ICD-10-CM

## 2018-05-25 DIAGNOSIS — K766 Portal hypertension: Secondary | ICD-10-CM

## 2018-05-25 HISTORY — PX: IR PARACENTESIS: IMG2679

## 2018-05-25 LAB — BASIC METABOLIC PANEL
Anion gap: 9 (ref 5–15)
BUN: 14 mg/dL (ref 8–23)
CALCIUM: 8.5 mg/dL — AB (ref 8.9–10.3)
CHLORIDE: 109 mmol/L (ref 98–111)
CO2: 24 mmol/L (ref 22–32)
Creatinine, Ser: 1.08 mg/dL — ABNORMAL HIGH (ref 0.44–1.00)
GFR calc non Af Amer: 48 mL/min — ABNORMAL LOW (ref >60)
GFR, EST AFRICAN AMERICAN: 55 mL/min — AB (ref >60)
GLUCOSE: 177 mg/dL — AB (ref 70–99)
Potassium: 4.1 mmol/L (ref 3.5–5.1)
Sodium: 142 mmol/L (ref 135–145)

## 2018-05-25 LAB — CBC WITH DIFFERENTIAL/PLATELET
Basophils Absolute: 0.4 10*3/uL — ABNORMAL HIGH (ref 0.0–0.1)
Basophils Relative: 1 %
EOS PCT: 0 %
Eosinophils Absolute: 0 10*3/uL (ref 0.0–0.7)
HEMATOCRIT: 23.7 % — AB (ref 36.0–46.0)
HEMOGLOBIN: 6.9 g/dL — AB (ref 12.0–15.0)
LYMPHS PCT: 5 %
Lymphs Abs: 2 10*3/uL (ref 0.7–4.0)
MCH: 26.3 pg (ref 26.0–34.0)
MCHC: 29.1 g/dL — ABNORMAL LOW (ref 30.0–36.0)
MCV: 90.5 fL (ref 78.0–100.0)
Monocytes Absolute: 2 10*3/uL — ABNORMAL HIGH (ref 0.1–1.0)
Monocytes Relative: 5 %
NEUTROS PCT: 89 %
Neutro Abs: 35.9 10*3/uL — ABNORMAL HIGH (ref 1.7–7.7)
PLATELETS: 562 10*3/uL — AB (ref 150–400)
RBC: 2.62 MIL/uL — ABNORMAL LOW (ref 3.87–5.11)
RDW: 21.2 % — ABNORMAL HIGH (ref 11.5–15.5)
WBC: 40.3 10*3/uL — ABNORMAL HIGH (ref 4.0–10.5)

## 2018-05-25 LAB — BODY FLUID CELL COUNT WITH DIFFERENTIAL
EOS FL: 0 %
LYMPHS FL: 12 %
MONOCYTE-MACROPHAGE-SEROUS FLUID: 43 % — AB (ref 50–90)
Neutrophil Count, Fluid: 45 % — ABNORMAL HIGH (ref 0–25)
Total Nucleated Cell Count, Fluid: 434 cu mm (ref 0–1000)

## 2018-05-25 LAB — LACTATE DEHYDROGENASE, PLEURAL OR PERITONEAL FLUID: LD FL: 57 U/L — AB (ref 3–23)

## 2018-05-25 LAB — GRAM STAIN

## 2018-05-25 LAB — PROTEIN, PLEURAL OR PERITONEAL FLUID: Total protein, fluid: <3 g/dL

## 2018-05-25 LAB — PREPARE RBC (CROSSMATCH)

## 2018-05-25 LAB — AMMONIA: Ammonia: 34 umol/L (ref 9–35)

## 2018-05-25 LAB — GLUCOSE, CAPILLARY
GLUCOSE-CAPILLARY: 145 mg/dL — AB (ref 70–99)
Glucose-Capillary: 172 mg/dL — ABNORMAL HIGH (ref 70–99)
Glucose-Capillary: 167 mg/dL — ABNORMAL HIGH (ref 70–99)
Glucose-Capillary: 154 mg/dL — ABNORMAL HIGH (ref 70–99)

## 2018-05-25 LAB — ALBUMIN, PLEURAL OR PERITONEAL FLUID: Albumin, Fluid: <1 g/dL

## 2018-05-25 MED ORDER — SODIUM CHLORIDE 0.9 % IV SOLN
INTRAVENOUS | Status: DC | PRN
  Administered 2018-05-25: 21:00:00 via INTRAVENOUS

## 2018-05-25 MED ORDER — METHYLPREDNISOLONE SODIUM SUCC 40 MG IJ SOLR
40.0000 mg | Freq: Every day | INTRAMUSCULAR | Status: DC
  Administered 2018-05-25 – 2018-05-28 (×4): 40 mg via INTRAVENOUS
  Filled 2018-05-25 (×3): qty 1

## 2018-05-25 MED ORDER — FLUCONAZOLE 100 MG PO TABS
200.0000 mg | ORAL_TABLET | Freq: Once | ORAL | Status: AC
  Administered 2018-05-25: 200 mg via ORAL
  Filled 2018-05-25: qty 2

## 2018-05-25 MED ORDER — LIDOCAINE HCL (PF) 2 % IJ SOLN
INTRAMUSCULAR | Status: AC
  Filled 2018-05-25: qty 20

## 2018-05-25 MED ORDER — ALBUMIN HUMAN 25 % IV SOLN
25.0000 g | Freq: Once | INTRAVENOUS | Status: AC
  Administered 2018-05-25: 25 g via INTRAVENOUS
  Filled 2018-05-25: qty 50

## 2018-05-25 MED ORDER — SODIUM CHLORIDE 0.9% IV SOLUTION: Freq: Once | INTRAVENOUS | Status: DC

## 2018-05-25 MED ORDER — LIDOCAINE HCL (PF) 2 % IJ SOLN
INTRAMUSCULAR | Status: AC | PRN
  Administered 2018-05-25: 10 mL

## 2018-05-25 MED ORDER — FLUCONAZOLE 100 MG PO TABS
100.0000 mg | ORAL_TABLET | Freq: Every day | ORAL | Status: DC
  Administered 2018-05-26 – 2018-05-28 (×3): 100 mg via ORAL
  Filled 2018-05-25 (×3): qty 1

## 2018-05-25 MED ORDER — FUROSEMIDE 40 MG PO TABS
40.0000 mg | ORAL_TABLET | Freq: Every day | ORAL | Status: DC
  Administered 2018-05-25: 40 mg via ORAL
  Filled 2018-05-25: qty 1

## 2018-05-25 NOTE — Procedures (Signed)
PROCEDURE SUMMARY:  Successful US guided paracentesis from LLQ.  Yielded 2.7 L of slightly hazy yellow fluid.  No immediate complications.  Pt tolerated well.   Specimen was sent for labs.  Ascencion Dike PA-C 05/25/2018 4:13 PM

## 2018-05-25 NOTE — Progress Notes (Signed)
Pt refused CPAP, resting comfortably will cont to monitor.

## 2018-05-25 NOTE — Progress Notes (Signed)
PROGRESS NOTE    [name]  RDE:[id number] DOB: 1940/12/07 DOA: 05/23/2018 PCP: [name], NP   Brief Narrative: Amanda [name] is a 77 y.o. 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 difficile  3 years ago, chronic diarrhea on chronic Imodium, presenting with left lower quadrant abdominal pain, first episode about 2 weeks ago, described as sharp left lower quadrant pain, but these symptoms got worse overnight, for which she sought medical attention.  She denies any gross blood in the stools.  She was feeling nauseous on presentation. She does take aspirin a day, and reports being compliant with her medications.  Of note, her Hydrea was modified a few weeks ago.  Patient found to be anemic on presentation.  FOBT was found to be positive in the emergency department.  Admitted for further management.  GI consulted.  Plan is to do EGD/colonoscopy.  Assessment & Plan:   Principal Problem:   Acute blood loss anemia Active Problems:   Hyperlipidemia   Depression   Essential hypertension, benign   DIASTOLIC DYSFUNCTION   PSORIASIS   [name] (HCC)   Thrombocytosis (HCC)   Coronary artery disease involving native coronary artery   Hepatic cirrhosis (HCC)   OSA (obstructive sleep apnea)   Chronic diarrhea   Iron deficiency anemia   CVA (cerebral vascular accident) ([place])   Anemia, likely of blood loss FOBT was positive .  Hemoglobin was 6.2 on presentation.  Her baseline hemoglobin is around 12. S/P 2 units of PRBC but Hb again dropped to 6.9 .we have ordered for 2 more units of PRBC.  Total 4 units so far .Patient denies any bloody stools or frank bleeding. On Famotidine 25m IV BID  Hold all NSAIDs including preadmission baby aspirin GI consulted.  Planning for EGD/colonoscopy.  She has history of NKarlene [name], rule out esophageal or gastric varices.  Acute left lower quadrant pain,  with CT of the abdomen confirming diverticulosis without diverticulitis  No other acute abdominal findings are noted except for diverticulosis. She has known history of hepatic cirrhosis and ascites secondary to NASH CT scan also showed ascites.  Will attempt IR guided paracentesis for both diagnostic and therapeutic purpose.  Chronic diarrhea: Continue with her Imodium, as well as Creon.  Currently she does not have diarrhea.  Lower extremity edema Currently on Lasix.  Chest x-ray done today showed mild pulmonary edema.  Patient has wheezes on auscultation.  [name] Jak 2+, On Hydrea . Follows with Dr. EMarin Olp.She has severe leukocytosis and thrombocytosis which is a chronic finding.  OSA Continue CPAP nightly  Type II Diabetes with neuropathy  SSI  Continue Neurontin  Hyperlipidemia Continue home statins  Hypertension  Hold meds for now.Normotensive.  Anxiety and depression Continue Remeron  Oral candidiasis: Continue antifungals  Difficulty in  Ambulation/debility/deconditioning Physical therapy evaluation done.  Recommended skilled nursing facility discharge.  DVT prophylaxis: SCD Code Status: DNR Family Communication: Husband present at the bedside Disposition Plan: Pending work-up   Consultants: Gastroenterology  Procedures: None  Antimicrobials: None  Subjective: Patient seen and examined the bedside this morning.  Hemodynamically stable.  She was very confused today and bit agitated.  She says she wants to go home.  Objective: Vitals:   05/25/18 0610 05/25/18 0900 05/25/18 1146 05/25/18 1204  BP: (!) 156/85  (!) 150/54 (!) 157/55  Pulse: (!) 106  96 91  Resp:   20 18  Temp:   99.2 F (37.3 C) 99.4 F (37.4 C)  TempSrc:   Oral Oral  SpO2: 100%  97% 96%  Weight:  98.7 kg    Height:        Intake/Output Summary (Last 24 hours) at 05/25/2018 1348 Last data filed at 05/24/2018 1845 Gross per 24 hour  Intake 698.98 ml  Output 1050 ml   Net -351.02 ml   Filed Weights   05/23/18 0941 05/23/18 2336 05/25/18 0900  Weight: 96.2 kg 102.3 kg 98.7 kg    Examination:  General exam: Anxious ,Not in distress,obese HEENT:PERRL,Oral mucosa moist, Ear/Nose normal on gross exam Respiratory system: Bilateral expiratory wheezes Cardiovascular system: S1 & S2 heard, RRR. No JVD, murmurs, rubs, gallops or clicks. 1-2 +pedal edema. Gastrointestinal system: Abdomen is distended, soft and nontender. No organomegaly or masses felt. Normal bowel sounds heard. Central nervous system: Alert and awake. No focal neurological deficits.Confused Extremities: 1-2 + edema, no clubbing ,no cyanosis, distal peripheral pulses palpable. Skin: No rashes, lesions or ulcers,no icterus ,no pallor MSK: Normal muscle bulk,tone ,power  Data Reviewed: I have personally reviewed following labs and imaging studies  CBC: Recent Labs  Lab 05/23/18 1042 05/23/18 2245 05/24/18 0025 05/24/18 0651 05/25/18 0538  WBC 46.8* 46.9* 44.1* 48.1* 40.3*  NEUTROABS 42.5*  --   --   --  35.9*  HGB 6.2* 7.8* 7.0* 7.1* 6.9*  HCT 22.1* 26.8* 23.9* 24.3* 23.7*  MCV 90.2 92.1 90.5 91.0 90.5  PLT 703* 697* 635* 679* 592*   Basic Metabolic Panel: Recent Labs  Lab 05/23/18 1042 05/24/18 0651 05/25/18 0538  NA 141 140 142  K 4.2 4.1 4.1  CL 110 109 109  CO2 22 20* 24  GLUCOSE 210* 182* 177*  BUN 15 14 14   CREATININE 1.16* 1.07* 1.08*  CALCIUM 8.4* 8.4* 8.5*   GFR: Estimated Creatinine Clearance: 49 mL/min (A) (by C-G formula based on SCr of 1.08 mg/dL (H)). Liver Function Tests: Recent Labs  Lab 05/23/18 1042  AST 23  ALT 9  ALKPHOS 119  BILITOT 1.3*  PROT 5.6*  ALBUMIN 3.1*   Recent Labs  Lab 05/23/18 1042  LIPASE 41   Recent Labs  Lab 05/25/18 0538  AMMONIA 34   Coagulation Profile: Recent Labs  Lab 05/23/18 1317 05/24/18 1849  INR 1.32 1.46   Cardiac Enzymes: No results for input(s): CKTOTAL, CKMB, CKMBINDEX, TROPONINI in the last  168 hours. BNP (last 3 results) No results for input(s): PROBNP in the last 8760 hours. HbA1C: No results for input(s): HGBA1C in the last 72 hours. CBG: Recent Labs  Lab 05/24/18 1143 05/24/18 1621 05/24/18 2144 05/25/18 0829 05/25/18 1222  GLUCAP 153* 173* 168* 172* 167*   Lipid Profile: No results for input(s): CHOL, HDL, LDLCALC, TRIG, CHOLHDL, LDLDIRECT in the last 72 hours. Thyroid Function Tests: Recent Labs    05/23/18 2245  TSH 3.973   Anemia Panel: No results for input(s): VITAMINB12, FOLATE, FERRITIN, TIBC, IRON, RETICCTPCT in the last 72 hours. Sepsis Labs: No results for input(s): PROCALCITON, LATICACIDVEN in the last 168 hours.  Recent Results (from the past 240 hour(s))  MRSA PCR Screening     Status: Abnormal   Collection Time: 05/23/18 11:51 PM  Result Value Ref Range Status   MRSA by PCR POSITIVE (A) NEGATIVE Final    Comment:        The GeneXpert MRSA Assay (FDA approved for NASAL specimens only), is one component of a comprehensive MRSA colonization surveillance program. It  is not intended to diagnose MRSA infection nor to guide or monitor treatment for MRSA infections. RESULT CALLED TO, READ BACK BY AND VERIFIED WITH: Toy Cookey RN 0234 05/24/18 A BROWNING Performed at [facility], [place] [address], [place], [place] [address]          Radiology Studies: Ct Abdomen Pelvis W Contrast  Result Date: 05/23/2018 CLINICAL DATA:  Left lower quadrant abdominal pain beginning last night and worsening today. EXAM: CT ABDOMEN AND PELVIS WITH CONTRAST TECHNIQUE: Multidetector CT imaging of the abdomen and pelvis was performed using the standard protocol following bolus administration of intravenous contrast. CONTRAST:  157m ISOVUE-300 IOPAMIDOL (ISOVUE-300) INJECTION 61% COMPARISON:  01/04/2018 FINDINGS: The patient is scanned in the right-side-down decubitus position. lower chest: Negative Hepatobiliary: Chronic cirrhosis of the liver. No acute liver  finding. No calcified gallstones. Pancreas: No lesion of the pancreas identified. Spleen: Splenomegaly as seen previously. Adrenals/Urinary Tract: Normal adrenal glands. Normal kidneys. Normal bladder. Stomach/Bowel: No acute bowel pathology is seen. Patient has diverticulosis of the sigmoid colon but I do not see evidence of diverticulitis. No bowel obstruction. Vascular/Lymphatic: Aortic atherosclerosis. No aneurysm. IVC is normal. No retroperitoneal adenopathy. Reproductive: No pelvic mass. Other: Increase in amount of ascites, freely distributed and layering dependently due to the decubitus positioning. Musculoskeletal: Negative IMPRESSION: Patient is scanned in the right-side-down decubitus position. Marked increase in the amount of ascites, freely distributed within the peritoneal space and layering dependently due to the decubitus positioning. 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 Flow is present in the portal system, but I cannot determine direction by CT. Diverticulosis but no evidence of diverticulitis. Electronically Signed   By: MNelson ChimesM.D.   On: 05/23/2018 16:12   Dg Chest Port 1 View  Result Date: 05/25/2018 CLINICAL DATA:  Shortness of breath. EXAM: PORTABLE CHEST 1 VIEW COMPARISON:  Radiographs and CT scan of March 23, 2018. FINDINGS: Stable cardiomegaly with central pulmonary vascular congestion is noted. Mildly increased interstitial densities are noted bilaterally suggesting possible pulmonary edema. No pneumothorax or significant pleural effusion is noted. Bony thorax is unremarkable. IMPRESSION: Stable cardiomegaly with central pulmonary vascular congestion and probable mild bilateral pulmonary edema. Electronically Signed   By: JMarijo Conception M.D.   On: 05/25/2018 12:54        Scheduled Meds: . sodium chloride   Intravenous Once  . Chlorhexidine Gluconate Cloth  6 each Topical Q0600  . [START ON 05/26/2018] fluconazole  100 mg Oral Daily  . furosemide  40  mg Oral Daily  . insulin aspart  0-9 Units Subcutaneous TID WC  . lipase/protease/amylase  36,000 Units Oral TID WC  . methylPREDNISolone (SOLU-MEDROL) injection  40 mg Intravenous Daily  . mupirocin ointment  1 application Nasal BID  . pantoprazole (PROTONIX) IV  40 mg Intravenous Q12H  . rosuvastatin  20 mg Oral q1800   Continuous Infusions:    LOS: 2 days    Time spent:25 mins. More than 50% of that time was spent in counseling and/or coordination of care.      AShelly Coss MD [facility] Pager [phone] If 7PM-7AM, please contact night-coverage [email or web address] Password [email or web address] 05/25/2018, 1:48 PM

## 2018-05-25 NOTE — Progress Notes (Signed)
CRITICAL VALUE ALERT  Critical Value:  Hgb 6.9  Date & Time Notied:  05/25/18 0740  Provider Notified: MD Adhikari  Orders Received/Actions taken: Awaiting call back

## 2018-05-25 NOTE — H&P (View-Only) (Signed)
[facility] Progress Note   Chief Complaint:   Symptomatic anemia   SUBJECTIVE:    no diarrhea. No abdominal pain today.    ASSESSMENT AND PLAN:   79. 77 yo female with multiple medical problems and NASH cirrhosis complicated with portal HTN admitted with symptomatic [place] anemia,heme positive stools. Hgb 6.2, baseline ~ 12. No overt GI bleeding.  -Getting a 2nd unit of blood now. Hgb 6.9 this am despite unit of blood two days ago for hgb of 6.2.   -Patient hasn't had an EGD since 2015 ( no varices found) and her last colonoscopy was several years ago as well. She may have portal HTN as cause for heme positive stools / anemia. She will need EGD, +/- repeat colonoscopy.  -HCC:  Normal AFP in April 2019> no liver lesions on CT scan but massive ascites. May need eventual therapeutic paracentesis is starts to affect breathing. She had diffuse abdominal pain prior to admission per husband. We need to at least get diagnostic tap to rule out SBP.   -She got a dose of lasix yesterday. Since admit she is +1000 ml fluid balance. Needs daily weights. Low sodium diet when advanced from clears.   2. 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 Hematology to see, Hydrea on hold. She has severe leukocytosis and thrombocytosis  3. Chronic diarrhea, stool studies are pending. Not having diarrhea now and it wasn't any worse than normal anyway.  -will d/c stool studies for now.   4. Oral candidiasis. -will ask Pharmacy to start ketoconazole and adjust dose for cirrhosis    Attending physician's note   I have taken an interval history, reviewed the chart and examined the patient. I agree with the Advanced Practitioner's note, impression and recommendations.   77 year old WF with very complex history of CAD, PCV on Hydrea (being followed by hematology), liver cirrhosis with portal hypertension with ascites likely due to NASH, T2DM, H/O C. difficile, chronic diarrhea (resolved), recent CVA last month,  sleep apnea, morbid obesity admitted with heme positive stools and symptomatic anemia.   No overt GI bleeding.  EGD 02/2014-negative for varices or portal hypertensive gastropathy.  Colonoscopy 2012-multiple small sessile polyps. CT 05/23/2018 liver cirrhosis with ascites with chronic splenomegaly, diverticulosis. Normal AFP and ammonia. Has increasing ascites, oral thrush. Plan: LVP (send ascitic fluid studies), Diflucan (pharmacy to dose), EGD, trend CBC, Lasix and Spironolactone, monitor weight. Salt and fluid restricted diet. Does not want colonoscopy.  Patient with poor long-term prognosis due to multiple comorbid conditions.  Discussed with patient's family.  Carmell Austria, MD   OBJECTIVE:     Vital signs in last 24 hours: Temp:  [98.4 F (36.9 C)-99.5 F (37.5 C)] 99.5 F (37.5 C) (08/07 2149) Pulse Rate:  [105-106] 106 (08/08 0610) Resp:  [19] 19 (08/07 2149) BP: (156-175)/(63-85) 156/85 (08/08 0610) SpO2:  [96 %-100 %] 100 % (08/08 0610) Last BM Date: 05/23/18 General:   Alert, obese female in NAD EENT:  Normal hearing, non icteric sclera, conjunctive pink.  Heart:  Sinus tachycardia , no lower extremity edema Pulm: Breathing slightly labored with conversation / movement. Occasional scattered wheezing. Fine bibasilar crackles.  Abdomen:  Soft, distended, nontender.  Normal bowel sounds, no masses felt.     Neurologic:  Alert and  oriented x4;  grossly normal neurologically. No asterixis Psych:  Pleasant, cooperative.  Normal mood and affect.   Intake/Output from previous day: 08/07 0701 - 08/08 0700 In: 699 [P.O.:220; I.V.:479] Out: 1250 [Urine:1250] Intake/Output this shift:  No intake/output data recorded.  Lab Results: Recent Labs    05/24/18 0025 05/24/18 0651 05/25/18 0538  WBC 44.1* 48.1* 40.3*  HGB 7.0* 7.1* 6.9*  HCT 23.9* 24.3* 23.7*  PLT 635* 679* 562*   BMET Recent Labs    05/23/18 1042 05/24/18 0651 05/25/18 0538  NA 141 140 142  K 4.2 4.1 4.1  CL  110 109 109  CO2 22 20* 24  GLUCOSE 210* 182* 177*  BUN 15 14 14   CREATININE 1.16* 1.07* 1.08*  CALCIUM 8.4* 8.4* 8.5*   LFT Recent Labs    05/23/18 1042  PROT 5.6*  ALBUMIN 3.1*  AST 23  ALT 9  ALKPHOS 119  BILITOT 1.3*  BILIDIR 0.3*  IBILI 1.0*   PT/INR Recent Labs    05/23/18 1317 05/24/18 1849  LABPROT 16.2* 17.6*  INR 1.32 1.46   Hepatitis Panel No results for input(s): HEPBSAG, HCVAB, HEPAIGM, HEPBIGM in the last 72 hours.  Ct Abdomen Pelvis W Contrast  Result Date: 05/23/2018 CLINICAL DATA:  Left lower quadrant abdominal pain beginning last night and worsening today. EXAM: CT ABDOMEN AND PELVIS WITH CONTRAST TECHNIQUE: Multidetector CT imaging of the abdomen and pelvis was performed using the standard protocol following bolus administration of intravenous contrast. CONTRAST:  127m ISOVUE-300 IOPAMIDOL (ISOVUE-300) INJECTION 61% COMPARISON:  01/04/2018 FINDINGS: The patient is scanned in the right-side-down decubitus position. lower chest: Negative Hepatobiliary: Chronic cirrhosis of the liver. No acute liver finding. No calcified gallstones. Pancreas: No lesion of the pancreas identified. Spleen: Splenomegaly as seen previously. Adrenals/Urinary Tract: Normal adrenal glands. Normal kidneys. Normal bladder. Stomach/Bowel: No acute bowel pathology is seen. Patient has diverticulosis of the sigmoid colon but I do not see evidence of diverticulitis. No bowel obstruction. Vascular/Lymphatic: Aortic atherosclerosis. No aneurysm. IVC is normal. No retroperitoneal adenopathy. Reproductive: No pelvic mass. Other: Increase in amount of ascites, freely distributed and layering dependently due to the decubitus positioning. Musculoskeletal: Negative IMPRESSION: Patient is scanned in the right-side-down decubitus position. Marked increase in the amount of ascites, freely distributed within the peritoneal space and layering dependently due to the decubitus positioning. Chronic cirrhosis of  the liver with chronic splenomegaly. Flow is present in the portal system, but I cannot determine direction by CT. Diverticulosis but no evidence of diverticulitis. Electronically Signed   By: MNelson ChimesM.D.   On: 05/23/2018 16:12      Principal Problem:   Acute blood loss anemia Active Problems:   Hyperlipidemia   Depression   Essential hypertension, benign   DIASTOLIC DYSFUNCTION   PSORIASIS   [name] (HCC)   Thrombocytosis (HCC)   Coronary artery disease involving native coronary artery   Hepatic cirrhosis (HCC)   OSA (obstructive sleep apnea)   Chronic diarrhea   Iron deficiency anemia   [place] (cerebral vascular accident) ([place]     LOS: 2 days   PTye Savoy,NP 05/25/2018, 11:35 AM

## 2018-05-25 NOTE — Progress Notes (Signed)
Patient refused CPAP for the night  

## 2018-05-25 NOTE — Progress Notes (Signed)
Pharmacy Antibiotic Note  NATHALIE CAVENDISH is a 77 y.o. female admitted on 05/23/2018 with thrush.  Pharmacy has been consulted for fluconazole dosing.  Plan: Give fluconazole 250m PO x 1, then start fluconazole 1037mto complete 7 days  Height: 5' 4"  (162.6 cm) Weight: 217 lb 9.5 oz (98.7 kg) IBW/kg (Calculated) : 54.7  Temp (24hrs), Avg:99.1 F (37.3 C), Min:98.4 F (36.9 C), Max:99.5 F (37.5 C)  Recent Labs  Lab 05/23/18 1042 05/23/18 2245 05/24/18 0025 05/24/18 0651 05/25/18 0538  WBC 46.8* 46.9* 44.1* 48.1* 40.3*  CREATININE 1.16*  --   --  1.07* 1.08*    Estimated Creatinine Clearance: 49 mL/min (A) (by C-G formula based on SCr of 1.08 mg/dL (H)).    Allergies  Allergen Reactions  . Penicillins Swelling    Has patient had a PCN reaction causing immediate rash, facial/tongue/throat swelling, SOB or lightheadedness with hypotension: No Has patient had a PCN reaction causing severe rash involving mucus membranes or skin necrosis: No Has patient had a PCN reaction that required hospitalization: No Has patient had a PCN reaction occurring within the last 10 years: No If all of the above answers are "NO", then may proceed with Cephalosporin use.  . Marland Kitchenisinopril Cough  . Tape Hives  . Doxycycline Hives, Swelling and Rash  . Latex Hives, Itching and Rash    Thank you for allowing pharmacy to be a part of this patient's care.  BAReginia Naas/05/2018 1:12 PM

## 2018-05-25 NOTE — Progress Notes (Addendum)
[facility] Progress Note   Chief Complaint:   Symptomatic anemia   SUBJECTIVE:    no diarrhea. No abdominal pain today.    ASSESSMENT AND PLAN:   53. 77 yo female with multiple medical problems and NASH cirrhosis complicated with portal HTN admitted with symptomatic [place] anemia,heme positive stools. Hgb 6.2, baseline ~ 12. No overt GI bleeding.  -Getting a 2nd unit of blood now. Hgb 6.9 this am despite unit of blood two days ago for hgb of 6.2.   -Patient hasn't had an EGD since 2015 ( no varices found) and her last colonoscopy was several years ago as well. She may have portal HTN as cause for heme positive stools / anemia. She will need EGD, +/- repeat colonoscopy.  -HCC:  Normal AFP in April 2019> no liver lesions on CT scan but massive ascites. May need eventual therapeutic paracentesis is starts to affect breathing. She had diffuse abdominal pain prior to admission per husband. We need to at least get diagnostic tap to rule out SBP.   -She got a dose of lasix yesterday. Since admit she is +1000 ml fluid balance. Needs daily weights. Low sodium diet when advanced from clears.   2. 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 Hematology to see, Hydrea on hold. She has severe leukocytosis and thrombocytosis  3. Chronic diarrhea, stool studies are pending. Not having diarrhea now and it wasn't any worse than normal anyway.  -will d/c stool studies for now.   4. Oral candidiasis. -will ask Pharmacy to start ketoconazole and adjust dose for cirrhosis    Attending physician's note   I have taken an interval history, reviewed the chart and examined the patient. I agree with the Advanced Practitioner's note, impression and recommendations.   77 year old WF with very complex history of CAD, PCV on Hydrea (being followed by hematology), liver cirrhosis with portal hypertension with ascites likely due to NASH, T2DM, H/O C. difficile, chronic diarrhea (resolved), recent CVA last month,  sleep apnea, morbid obesity admitted with heme positive stools and symptomatic anemia.   No overt GI bleeding.  EGD 02/2014-negative for varices or portal hypertensive gastropathy.  Colonoscopy 2012-multiple small sessile polyps. CT 05/23/2018 liver cirrhosis with ascites with chronic splenomegaly, diverticulosis. Normal AFP and ammonia. Has increasing ascites, oral thrush. Plan: LVP (send ascitic fluid studies), Diflucan (pharmacy to dose), EGD, trend CBC, Lasix and Spironolactone, monitor weight. Salt and fluid restricted diet. Does not want colonoscopy.  Patient with poor long-term prognosis due to multiple comorbid conditions.  Discussed with patient's family.  Carmell Austria, MD   OBJECTIVE:     Vital signs in last 24 hours: Temp:  [98.4 F (36.9 C)-99.5 F (37.5 C)] 99.5 F (37.5 C) (08/07 2149) Pulse Rate:  [105-106] 106 (08/08 0610) Resp:  [19] 19 (08/07 2149) BP: (156-175)/(63-85) 156/85 (08/08 0610) SpO2:  [96 %-100 %] 100 % (08/08 0610) Last BM Date: 05/23/18 General:   Alert, obese female in NAD EENT:  Normal hearing, non icteric sclera, conjunctive pink.  Heart:  Sinus tachycardia , no lower extremity edema Pulm: Breathing slightly labored with conversation / movement. Occasional scattered wheezing. Fine bibasilar crackles.  Abdomen:  Soft, distended, nontender.  Normal bowel sounds, no masses felt.     Neurologic:  Alert and  oriented x4;  grossly normal neurologically. No asterixis Psych:  Pleasant, cooperative.  Normal mood and affect.   Intake/Output from previous day: 08/07 0701 - 08/08 0700 In: 699 [P.O.:220; I.V.:479] Out: 1250 [Urine:1250] Intake/Output this shift:  No intake/output data recorded.  Lab Results: Recent Labs    05/24/18 0025 05/24/18 0651 05/25/18 0538  WBC 44.1* 48.1* 40.3*  HGB 7.0* 7.1* 6.9*  HCT 23.9* 24.3* 23.7*  PLT 635* 679* 562*   BMET Recent Labs    05/23/18 1042 05/24/18 0651 05/25/18 0538  NA 141 140 142  K 4.2 4.1 4.1  CL  110 109 109  CO2 22 20* 24  GLUCOSE 210* 182* 177*  BUN 15 14 14   CREATININE 8.01* 1.07* 1.08*  CALCIUM 8.4* 8.4* 8.5*   LFT Recent Labs    05/23/18 1042  PROT 5.6*  ALBUMIN 3.1*  AST 23  ALT 9  ALKPHOS 119  BILITOT 1.3*  BILIDIR 0.3*  IBILI 1.0*   PT/INR Recent Labs    05/23/18 1317 05/24/18 1849  LABPROT 16.2* 17.6*  INR 1.32 1.46   Hepatitis Panel No results for input(s): HEPBSAG, HCVAB, HEPAIGM, HEPBIGM in the last 72 hours.  Ct Abdomen Pelvis W Contrast  Result Date: 05/23/2018 CLINICAL DATA:  Left lower quadrant abdominal pain beginning last night and worsening today. EXAM: CT ABDOMEN AND PELVIS WITH CONTRAST TECHNIQUE: Multidetector CT imaging of the abdomen and pelvis was performed using the standard protocol following bolus administration of intravenous contrast. CONTRAST:  140m ISOVUE-300 IOPAMIDOL (ISOVUE-300) INJECTION 61% COMPARISON:  01/04/2018 FINDINGS: The patient is scanned in the right-side-down decubitus position. lower chest: Negative Hepatobiliary: Chronic cirrhosis of the liver. No acute liver finding. No calcified gallstones. Pancreas: No lesion of the pancreas identified. Spleen: Splenomegaly as seen previously. Adrenals/Urinary Tract: Normal adrenal glands. Normal kidneys. Normal bladder. Stomach/Bowel: No acute bowel pathology is seen. Patient has diverticulosis of the sigmoid colon but I do not see evidence of diverticulitis. No bowel obstruction. Vascular/Lymphatic: Aortic atherosclerosis. No aneurysm. IVC is normal. No retroperitoneal adenopathy. Reproductive: No pelvic mass. Other: Increase in amount of ascites, freely distributed and layering dependently due to the decubitus positioning. Musculoskeletal: Negative IMPRESSION: Patient is scanned in the right-side-down decubitus position. Marked increase in the amount of ascites, freely distributed within the peritoneal space and layering dependently due to the decubitus positioning. Chronic cirrhosis of  the liver with chronic splenomegaly. Flow is present in the portal system, but I cannot determine direction by CT. Diverticulosis but no evidence of diverticulitis. Electronically Signed   By: MNelson ChimesM.D.   On: 05/23/2018 16:12      Principal Problem:   Acute blood loss anemia Active Problems:   Hyperlipidemia   Depression   Essential hypertension, benign   DIASTOLIC DYSFUNCTION   PSORIASIS   [name] (HCC)   Thrombocytosis (HCC)   Coronary artery disease involving native coronary artery   Hepatic cirrhosis (HCC)   OSA (obstructive sleep apnea)   Chronic diarrhea   Iron deficiency anemia   CVA (cerebral vascular accident) ([place]     LOS: 2 days   PTye Savoy,NP 05/25/2018, 11:35 AM

## 2018-05-26 ENCOUNTER — Encounter (HOSPITAL_COMMUNITY)
Admission: EM | Disposition: A | Discharge status: Home / Self Care | Payer: Self-pay | Source: Home / Self Care | Attending: Internal Medicine

## 2018-05-26 ENCOUNTER — Inpatient Hospital Stay (HOSPITAL_COMMUNITY): Payer: PPO | Admitting: Anesthesiology

## 2018-05-26 ENCOUNTER — Encounter (HOSPITAL_COMMUNITY): Payer: Self-pay | Admitting: *Deleted

## 2018-05-26 DIAGNOSIS — K3189 Other diseases of stomach and duodenum: Secondary | ICD-10-CM

## 2018-05-26 DIAGNOSIS — K2970 Gastritis, unspecified, without bleeding: Secondary | ICD-10-CM

## 2018-05-26 HISTORY — PX: ESOPHAGOGASTRODUODENOSCOPY: SHX5428

## 2018-05-26 HISTORY — PX: BIOPSY: SHX5522

## 2018-05-26 LAB — TYPE AND SCREEN
ABO/RH(D): O POS
ANTIBODY SCREEN: NEGATIVE
UNIT DIVISION: 0
Unit division: 0
Unit division: 0

## 2018-05-26 LAB — COMPREHENSIVE METABOLIC PANEL
ALBUMIN: 2.9 g/dL — AB (ref 3.5–5.0)
ALT: 7 U/L (ref 0–44)
AST: 18 U/L (ref 15–41)
Alkaline Phosphatase: 97 U/L (ref 38–126)
Anion gap: 11 (ref 5–15)
BUN: 14 mg/dL (ref 8–23)
CALCIUM: 8.3 mg/dL — AB (ref 8.9–10.3)
CO2: 25 mmol/L (ref 22–32)
CREATININE: 1.23 mg/dL — AB (ref 0.44–1.00)
Chloride: 106 mmol/L (ref 98–111)
GFR calc Af Amer: 47 mL/min — ABNORMAL LOW (ref >60)
GFR calc non Af Amer: 41 mL/min — ABNORMAL LOW (ref >60)
GLUCOSE: 169 mg/dL — AB (ref 70–99)
Potassium: 3.4 mmol/L — ABNORMAL LOW (ref 3.5–5.1)
Sodium: 142 mmol/L (ref 135–145)
Total Bilirubin: 1.9 mg/dL — ABNORMAL HIGH (ref 0.3–1.2)
Total Protein: 5.2 g/dL — ABNORMAL LOW (ref 6.5–8.1)

## 2018-05-26 LAB — BPAM RBC
BLOOD PRODUCT EXPIRATION DATE: 201909062359
Blood Product Expiration Date: 201908312359
Blood Product Expiration Date: 201909062359
ISSUE DATE / TIME: 201908061618
ISSUE DATE / TIME: 201908081706
ISSUE DATE / TIME: 201908081138
Unit Type and Rh: 5100
Unit Type and Rh: 5100
Unit Type and Rh: 5100

## 2018-05-26 LAB — CBC WITH DIFFERENTIAL/PLATELET
BASOS ABS: 0.3 10*3/uL — AB (ref 0.0–0.1)
Basophils Relative: 1 %
EOS PCT: 0 %
Eosinophils Absolute: 0 10*3/uL (ref 0.0–0.7)
HCT: 29.1 % — ABNORMAL LOW (ref 36.0–46.0)
HEMOGLOBIN: 8.7 g/dL — AB (ref 12.0–15.0)
LYMPHS PCT: 5 %
Lymphs Abs: 1.5 10*3/uL (ref 0.7–4.0)
MCH: 26.8 pg (ref 26.0–34.0)
MCHC: 29.9 g/dL — ABNORMAL LOW (ref 30.0–36.0)
MCV: 89.5 fL (ref 78.0–100.0)
MONOS PCT: 6 %
Monocytes Absolute: 1.8 10*3/uL — ABNORMAL HIGH (ref 0.1–1.0)
NEUTROS ABS: 25.8 10*3/uL — AB (ref 1.7–7.7)
Neutrophils Relative %: 88 %
Platelets: 441 10*3/uL — ABNORMAL HIGH (ref 150–400)
RBC: 3.25 MIL/uL — AB (ref 3.87–5.11)
RDW: 19.9 % — ABNORMAL HIGH (ref 11.5–15.5)
WBC: 29.4 10*3/uL — AB (ref 4.0–10.5)

## 2018-05-26 LAB — PATHOLOGIST SMEAR REVIEW

## 2018-05-26 LAB — GLUCOSE, CAPILLARY
GLUCOSE-CAPILLARY: 379 mg/dL — AB (ref 70–99)
Glucose-Capillary: 155 mg/dL — ABNORMAL HIGH (ref 70–99)
Glucose-Capillary: 182 mg/dL — ABNORMAL HIGH (ref 70–99)
Glucose-Capillary: 391 mg/dL — ABNORMAL HIGH (ref 70–99)

## 2018-05-26 SURGERY — EGD (ESOPHAGOGASTRODUODENOSCOPY)
Anesthesia: General

## 2018-05-26 MED ORDER — MIDAZOLAM HCL 5 MG/ML IJ SOLN
INTRAMUSCULAR | Status: AC
  Filled 2018-05-26: qty 1

## 2018-05-26 MED ORDER — LACTATED RINGERS IV SOLN
INTRAVENOUS | Status: DC | PRN
  Administered 2018-05-26: 12:00:00 via INTRAVENOUS

## 2018-05-26 MED ORDER — AMLODIPINE BESYLATE 10 MG PO TABS
10.0000 mg | ORAL_TABLET | Freq: Every day | ORAL | Status: DC
  Administered 2018-05-26 – 2018-05-27 (×2): 10 mg via ORAL
  Filled 2018-05-26 (×2): qty 1

## 2018-05-26 MED ORDER — POTASSIUM CHLORIDE 10 MEQ/100ML IV SOLN: 10.0000 meq | INTRAVENOUS | Status: AC

## 2018-05-26 MED ORDER — BUTAMBEN-TETRACAINE-BENZOCAINE 2-2-14 % EX AERO
INHALATION_SPRAY | CUTANEOUS | Status: DC | PRN
  Administered 2018-05-26: 2 via TOPICAL

## 2018-05-26 MED ORDER — MIDAZOLAM HCL 10 MG/2ML IJ SOLN
0.5000 mg | Freq: Once | INTRAMUSCULAR | Status: AC
  Administered 2018-05-26: 0.5 mg via INTRAVENOUS
  Filled 2018-05-26: qty 0.1

## 2018-05-26 MED ORDER — FUROSEMIDE 20 MG PO TABS: 20.0000 mg | ORAL_TABLET | Freq: Every day | ORAL | Status: DC

## 2018-05-26 MED ORDER — SPIRONOLACTONE 25 MG PO TABS
50.0000 mg | ORAL_TABLET | Freq: Every day | ORAL | Status: DC
  Administered 2018-05-26 – 2018-05-27 (×2): 50 mg via ORAL
  Filled 2018-05-26 (×2): qty 2

## 2018-05-26 MED ORDER — FUROSEMIDE 40 MG PO TABS
40.0000 mg | ORAL_TABLET | Freq: Every day | ORAL | Status: DC
  Administered 2018-05-27: 40 mg via ORAL
  Filled 2018-05-26 (×3): qty 1

## 2018-05-26 MED ORDER — PROPOFOL 10 MG/ML IV BOLUS
INTRAVENOUS | Status: DC | PRN
  Administered 2018-05-26: 20 mg via INTRAVENOUS
  Administered 2018-05-26: 15 mg via INTRAVENOUS

## 2018-05-26 MED ORDER — PROPOFOL 500 MG/50ML IV EMUL
INTRAVENOUS | Status: DC | PRN
  Administered 2018-05-26: 100 ug/kg/min via INTRAVENOUS

## 2018-05-26 MED ORDER — MIDAZOLAM HCL 2 MG/2ML IJ SOLN: 0.5000 mg | Freq: Once | INTRAMUSCULAR | Status: DC

## 2018-05-26 NOTE — Progress Notes (Addendum)
PROGRESS NOTE    [name]  VWU:[id number] DOB: August 08, 1941 DOA: 05/23/2018 PCP: [name], NP   Brief Narrative: Amanda [name] is a 77 y.o. 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 difficile  3 years ago, chronic diarrhea on chronic Imodium, presenting with left lower quadrant abdominal pain, first episode about 2 weeks ago, described as sharp left lower quadrant pain, but these symptoms got worse overnight, for which she sought medical attention.  She denies any gross blood in the stools.  She was feeling nauseous on presentation. She does take aspirin a day, and reports being compliant with her medications.  Of note, her Hydrea was modified a few weeks ago.  Patient found to be anemic on presentation.  FOBT was found to be positive in the emergency department.  Admitted for further management.  GI consulted.  Underwent  EGD today.  Assessment & Plan:   Principal Problem:   Acute blood loss anemia Active Problems:   Hyperlipidemia   Depression   Essential hypertension, benign   DIASTOLIC DYSFUNCTION   PSORIASIS   [name] (HCC)   Thrombocytosis (HCC)   Coronary artery disease involving native coronary artery   Hepatic cirrhosis (HCC)   OSA (obstructive sleep apnea)   Chronic diarrhea   Iron deficiency anemia   CVA (cerebral vascular accident) ([place])   Anemia:FOBT was positive .  Hemoglobin was 6.2 on presentation.  Her baseline hemoglobin is around 12. S/P 4  units of PRBC so far .Patient denied any bloody stools or frank bleeding. On PPI Hold all NSAIDs including preadmission baby aspirin GI following.  Underwent EGD today which showed gastritis , no active bleeding, no esophageal or fundal varices.  She denied colonoscopy.  Cirrhosis/NASH:She has history of [place] cirrhosis.  She underwent ultrasound-guided paracentesis with removal of 2.7 L of fluid.  Started on Lasix and  spinal lactone.  Fluid analysis not suggestive  for SBP.  Acute left lower quadrant pain:No other acute abdominal findings are noted except for diverticulosis as per CT.   Abdominal pain has resolved  Chronic diarrhea: Continue with her Imodium, as well as Creon.  Currently she does not have diarrhea.  Lower extremity edema Currently on Lasix,spironolactone.    Wheezing: No history of asthma /COPD but patient has persistent wheezing.  She was a smoker in the past .Chest x-ray done  showed mild pulmonary edema.  Started on diuretics.  Continue Solu-Medrol for now.  CKD stage 2-3: On reviewing her past BMP, her creatinine fluctuates from 1.2-1.4.  Currently kidney function on baseline.  [name]:Jak 2+, On Hydrea . Follows with Dr. [name] .She has severe leukocytosis and thrombocytosis which is a chronic finding.  GNF:AOZHYQMV CPAP nightly  Type II Diabetes with neuropathy :Continue SSI .Continue Neurontin  Hyperlipidemia:Continue home statins  Hypertension : Continue home medications  Anxiety and depression:Continue Remeron  Oral candidiasis: Continue antifungal  Difficulty in  Ambulation/debility/deconditioning Physical therapy evaluation done.  Recommended skilled nursing facility discharge.SW following.  DVT prophylaxis: SCD Code Status: DNR Family Communication: Husband present at the bedside Disposition Plan: Discharge to skilled nursing facility after GI clearance.  Probably  needs to watch her hemoglobin for next 24 to 48 hours.   Consultants: Gastroenterology  Procedures: None  Antimicrobials: None  Subjective: Patient seen and examined the bedside this morning.  Hemodynamically stable.  Underwent EGD today.  No new issues/events.  Objective: Vitals:   05/26/18 1046 05/26/18  1114 05/26/18 1220 05/26/18 1235  BP: (!) 181/72 (!) 181/74 (!) 108/34 (!) 134/46  Pulse: (!) 110  87   Resp: 20 20 18 16   Temp: 98.9 F (37.2 C)     TempSrc: Oral       SpO2: 97% 97% 92% 93%  Weight:      Height:        Intake/Output Summary (Last 24 hours) at 05/26/2018 1421 Last data filed at 05/26/2018 1211 Gross per 24 hour  Intake 677.84 ml  Output 1450 ml  Net -772.16 ml   Filed Weights   05/23/18 0941 05/23/18 2336 05/25/18 0900  Weight: 96.2 kg 102.3 kg 98.7 kg    Examination:  General exam: Anxious ,Not in distress,obese HEENT:PERRL,Oral mucosa moist, Ear/Nose normal on gross exam Respiratory system: Bilateral expiratory wheezes Cardiovascular system: S1 & S2 heard, RRR. No JVD, murmurs, rubs, gallops or clicks. 1-2 +pedal edema. Gastrointestinal system: Abdomen is distended, soft and nontender. No organomegaly or masses felt. Normal bowel sounds heard. Central nervous system: Alert and awake. No focal neurological deficits. Extremities: 1-2 + edema, no clubbing ,no cyanosis, distal peripheral pulses palpable. Skin: No rashes, lesions or ulcers,no icterus ,no pallor MSK: Normal muscle bulk,tone ,power  Data Reviewed: I have personally reviewed following labs and imaging studies  CBC: Recent Labs  Lab 05/23/18 1042 05/23/18 2245 05/24/18 0025 05/24/18 0651 05/25/18 0538 05/26/18 0549  WBC 46.8* 46.9* 44.1* 48.1* 40.3* 29.4*  NEUTROABS 42.5*  --   --   --  35.9* 25.8*  HGB 6.2* 7.8* 7.0* 7.1* 6.9* 8.7*  HCT 22.1* 26.8* 23.9* 24.3* 23.7* 29.1*  MCV 90.2 92.1 90.5 91.0 90.5 89.5  PLT 703* 697* 635* 679* 562* 419*   Basic Metabolic Panel: Recent Labs  Lab 05/23/18 1042 05/24/18 0651 05/25/18 0538 05/26/18 0549  NA 141 140 142 142  K 4.2 4.1 4.1 3.4*  CL 110 109 109 106  CO2 22 20* 24 25  GLUCOSE 210* 182* 177* 169*  BUN 15 14 14 14   CREATININE 1.16* 1.07* 1.08* 1.23*  CALCIUM 8.4* 8.4* 8.5* 8.3*   GFR: Estimated Creatinine Clearance: 43 mL/min (A) (by C-G formula based on SCr of 1.23 mg/dL (H)). Liver Function Tests: Recent Labs  Lab 05/23/18 1042 05/26/18 0549  AST 23 18  ALT 9 7  ALKPHOS 119 97  BILITOT  1.3* 1.9*  PROT 5.6* 5.2*  ALBUMIN 3.1* 2.9*   Recent Labs  Lab 05/23/18 1042  LIPASE 41   Recent Labs  Lab 05/25/18 0538  AMMONIA 34   Coagulation Profile: Recent Labs  Lab 05/23/18 1317 05/24/18 1849  INR 1.32 1.46   Cardiac Enzymes: No results for input(s): CKTOTAL, CKMB, CKMBINDEX, TROPONINI in the last 168 hours. BNP (last 3 results) No results for input(s): PROBNP in the last 8760 hours. HbA1C: No results for input(s): HGBA1C in the last 72 hours. CBG: Recent Labs  Lab 05/25/18 1222 05/25/18 1649 05/25/18 2235 05/26/18 0813 05/26/18 1054  GLUCAP 167* 154* 145* 155* 182*   Lipid Profile: No results for input(s): CHOL, HDL, LDLCALC, TRIG, CHOLHDL, LDLDIRECT in the last 72 hours. Thyroid Function Tests: Recent Labs    05/23/18 2245  TSH 3.973   Anemia Panel: No results for input(s): VITAMINB12, FOLATE, FERRITIN, TIBC, IRON, RETICCTPCT in the last 72 hours. Sepsis Labs: No results for input(s): PROCALCITON, LATICACIDVEN in the last 168 hours.  Recent Results (from the past 240 hour(s))  MRSA PCR Screening     Status: Abnormal  Collection Time: 05/23/18 11:51 PM  Result Value Ref Range Status   MRSA by PCR POSITIVE (A) NEGATIVE Final    Comment:        The GeneXpert MRSA Assay (FDA approved for NASAL specimens only), is one component of a comprehensive MRSA colonization surveillance program. It is not intended to diagnose MRSA infection nor to guide or monitor treatment for MRSA infections. RESULT CALLED TO, READ BACK BY AND VERIFIED WITH: Toy Cookey RN [id number] 05/24/18 A BROWNING Performed at [facility], [place] [address], [place], [place] [address]   Culture, body fluid-bottle     Status: None (Preliminary result)   Collection Time: 05/25/18  3:58 PM  Result Value Ref Range Status   Specimen Description PERITONEAL  Final   Special Requests NONE  Final   Culture   Final    NO GROWTH < 24 HOURS Performed at [facility], [place]  [address], [place], [place] [address]    Report Status PENDING  Incomplete  Gram stain     Status: None   Collection Time: 05/25/18  3:58 PM  Result Value Ref Range Status   Specimen Description PERITONEAL  Final   Special Requests NONE  Final   Gram Stain   Final    RARE WBC PRESENT,BOTH PMN AND MONONUCLEAR NO ORGANISMS SEEN Performed at [facility], [address], [place], [place] [address]    Report Status 05/25/2018 FINAL  Final         Radiology Studies: Dg Chest Port 1 View  Result Date: 05/25/2018 CLINICAL DATA:  Shortness of breath. EXAM: PORTABLE CHEST 1 VIEW COMPARISON:  Radiographs and CT scan of March 23, 2018. FINDINGS: Stable cardiomegaly with central pulmonary vascular congestion is noted. Mildly increased interstitial densities are noted bilaterally suggesting possible pulmonary edema. No pneumothorax or significant pleural effusion is noted. Bony thorax is unremarkable. IMPRESSION: Stable cardiomegaly with central pulmonary vascular congestion and probable mild bilateral pulmonary edema. Electronically Signed   By: Marijo Conception, M.D.   On: 05/25/2018 12:54   Ir Paracentesis  Result Date: 05/25/2018 INDICATION: History of NASH cirrhosis. Abdominal distention. Ascites. Request for diagnostic and therapeutic paracentesis. EXAM: ULTRASOUND GUIDED LEFT LOWER QUADRANT PARACENTESIS MEDICATIONS: None. COMPLICATIONS: None immediate. PROCEDURE: Informed written consent was obtained from the patient after a discussion of the risks, benefits and alternatives to treatment. A timeout was performed prior to the initiation of the procedure. Initial ultrasound scanning demonstrates a large amount of ascites within the left lower abdominal quadrant. The left lower abdomen was prepped and draped in the usual sterile fashion. 1% lidocaine with epinephrine was used for local anesthesia. Following this, a 19 gauge, 10-cm, Yueh catheter was introduced. An ultrasound image was saved for  documentation purposes. The paracentesis was performed. The catheter was removed and a dressing was applied. The patient tolerated the procedure well without immediate post procedural complication. FINDINGS: A total of approximately 2.7 L of slightly hazy, yellow fluid was removed. Samples were sent to the laboratory as requested by the clinical team. IMPRESSION: Successful ultrasound-guided paracentesis yielding 2.7 liters of peritoneal fluid. Read by: Ascencion Dike PA-C Electronically Signed   By: Lucrezia Europe M.D.   On: 05/25/2018 16:17        Scheduled Meds: . sodium chloride   Intravenous Once  . Chlorhexidine Gluconate Cloth  6 each Topical Q0600  . fluconazole  100 mg Oral Daily  . [START ON 05/27/2018] furosemide  20 mg Oral Daily  .  insulin aspart  0-9 Units Subcutaneous TID WC  . lipase/protease/amylase  36,000 Units Oral TID WC  . methylPREDNISolone (SOLU-MEDROL) injection  40 mg Intravenous Daily  . mupirocin ointment  1 application Nasal BID  . pantoprazole (PROTONIX) IV  40 mg Intravenous Q12H  . rosuvastatin  20 mg Oral q1800  . spironolactone  50 mg Oral Daily   Continuous Infusions: . sodium chloride Stopped (05/25/18 2300)     LOS: 3 days    Time spent:25 mins. More than 50% of that time was spent in counseling and/or coordination of care.      Shelly Coss, MD [facility] Pager [phone]  If 7PM-7AM, please contact night-coverage [email or web address] Password [email or web address] 05/26/2018, 2:21 PM

## 2018-05-26 NOTE — Anesthesia Preprocedure Evaluation (Addendum)
Anesthesia Evaluation  Patient identified by MRN, date of birth, ID band Patient awake    Reviewed: Allergy & Precautions, H&P , NPO status , Patient's Chart, lab work & pertinent test results, reviewed documented beta blocker date and time   Airway Mallampati: II  TM Distance: >3 FB Neck ROM: full    Dental no notable dental hx. (+) Edentulous Upper, Edentulous Lower, Upper Dentures, Lower Dentures   Pulmonary neg pulmonary ROS, former smoker,    Pulmonary exam normal breath sounds clear to auscultation       Cardiovascular Exercise Tolerance: Good hypertension, + CAD  negative cardio ROS   Rhythm:regular Rate:Normal  Echo 6/19 Study Conclusions  - Left ventricle: The cavity size was mildly dilated. There was   severe concentric hypertrophy. Systolic function was normal. The   estimated ejection fraction was in the range of 60% to 65%.   Doppler parameters are consistent with abnormal left ventricular   relaxation (grade 1 diastolic dysfunction). - Mitral valve: Severely calcified annulus. - Left atrium: The atrium was moderately dilated. - Impressions: Poor image quality and tech unable to complete exam   due to patient discomfort    Neuro/Psych negative neurological ROS  negative psych ROS   GI/Hepatic negative GI ROS, Neg liver ROS,   Endo/Other  negative endocrine ROSdiabetes, Type 2  Renal/GU Renal diseasenegative Renal ROS  negative genitourinary   Musculoskeletal   Abdominal   Peds  Hematology negative hematology ROS (+) anemia ,   Anesthesia Other Findings   Reproductive/Obstetrics negative OB ROS                            Anesthesia Physical Anesthesia Plan  ASA: IV  Anesthesia Plan: MAC   Post-op Pain Management:    Induction:   PONV Risk Score and Plan: 3 and Treatment may vary due to age or medical condition  Airway Management Planned: Nasal Cannula,  Natural Airway and Mask  Additional Equipment:   Intra-op Plan:   Post-operative Plan:   Informed Consent: I have reviewed the patients History and Physical, chart, labs and discussed the procedure including the risks, benefits and alternatives for the proposed anesthesia with the patient or authorized representative who has indicated his/her understanding and acceptance.   Dental Advisory Given  Plan Discussed with: CRNA, Anesthesiologist and Surgeon  Anesthesia Plan Comments:        Anesthesia Quick Evaluation

## 2018-05-26 NOTE — Progress Notes (Signed)
PT Cancellation Note  Patient Details Name: DWIGHT BURDO MRN: [id number] DOB: 05-21-1941   Cancelled Treatment:    Reason Eval/Treat Not Completed: Patient at procedure or test/unavailable (EGD).  Ellamae Sia, PT, DPT Acute Rehabilitation Services  Pager: [place] 05/26/2018, 11:12 AM

## 2018-05-26 NOTE — Transfer of Care (Signed)
Immediate Anesthesia Transfer of Care Note  Patient: Amanda [name]  Procedure(s) Performed: ESOPHAGOGASTRODUODENOSCOPY (EGD) (N/A )  Patient Location: PACU and Endoscopy Unit  Anesthesia Type:MAC  Level of Consciousness: drowsy and patient cooperative  Airway & Oxygen Therapy: Patient Spontanous Breathing and Patient connected to nasal cannula oxygen  Post-op Assessment: Report given to RN and Post -op Vital signs reviewed and stable  Post vital signs: Reviewed and stable  Last Vitals:  Vitals Value Taken Time  BP 108/34 05/26/2018 12:21 PM  Temp    Pulse 87 05/26/2018 12:21 PM  Resp 18 05/26/2018 12:21 PM  SpO2 94 % 05/26/2018 12:21 PM  Vitals shown include unvalidated device data.  Last Pain:  Vitals:   05/26/18 1046  TempSrc: Oral  PainSc: 0-No pain         Complications: No apparent anesthesia complications

## 2018-05-26 NOTE — Clinical Social Work Note (Signed)
Clinical Social Work Assessment  Patient Details  Name: Amanda [name] MRN: [id number] Date of Birth: [birth year]  Date of referral:  05/26/18               Reason for consult:  Facility Placement                Permission sought to share information with:  Facility Sport and exercise psychologist, Family Supports Permission granted to share information::  Yes, Verbal Permission Granted  Name::     Engineer, maintenance::  SNFs  Relationship::  Spouse  Contact Information:  [phone]  Housing/Transportation Living arrangements for the past 2 months:  [place], [place] of Information:  Patient, Adult Children, Spouse Patient Interpreter Needed:  None Criminal Activity/Legal Involvement Pertinent to Current Situation/Hospitalization:  No - Comment as needed Significant Relationships:  Adult Children, Spouse Lives with:  Spouse Do you feel safe going back to the place where you live?  No Need for family participation in patient care:  No (Coment)  Care giving concerns:  CSW received consult for possible SNF placement at time of discharge. CSW spoke with patient, daughter, and spouse at bedside regarding PT recommendation of SNF placement at time of discharge. Patient reported that patient's spouse is currently unable to care for patient at their home given patient's current physical needs and fall risk. Patient expressed understanding of PT recommendation and is agreeable to SNF placement at time of discharge. CSW to continue to follow and assist with discharge planning needs.   Social Worker assessment / plan:  CSW spoke with patient concerning possibility of rehab at [facility] before returning home.  Employment status:  Retired Nurse, adult PT Recommendations:  [place] / Referral to community resources:  [place]  Patient/Family's Response to care:  Patient recognizes need for rehab before  returning home and is agreeable to a SNF in [place]. Patient reported preference for [facility] since she was recently there in June. She reported that she only used a week of Medicare days, so hopefully will receive insurance approval.   Patient/Family's Understanding of and Emotional Response to Diagnosis, Current Treatment, and Prognosis:  Patient/family is realistic regarding therapy needs and expressed being hopeful for SNF placement. Patient expressed understanding of CSW role and discharge process as well as medical condition. No questions/concerns about plan or treatment.    Emotional Assessment Appearance:  Appears stated age Attitude/Demeanor/Rapport:  Guarded Affect (typically observed):  Accepting, Appropriate, Blunt Orientation:  Oriented to Self, Oriented to  Time, Oriented to Place, Oriented to Situation Alcohol / Substance use:  Not Applicable Psych involvement (Current and /or in the community):  No (Comment)  Discharge Needs  Concerns to be addressed:  Care Coordination Readmission within the last 30 days:  No Current discharge risk:  None Barriers to Discharge:  Continued Medical Work up   [employer], [place] 05/26/2018, 4:31 PM

## 2018-05-26 NOTE — Op Note (Signed)
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 difficile, chronic diarrhea                            (resolved),recent CVA last month, sleep apnea,                            morbid obesity admitted with heme positive stools                            and symptomatic anemia. No overt GI bleeding.                            EGD 02/2014-negative for varices or portal                            hypertensive gastropathy. Colonoscopy                            2012-multiple small sessile polyps. CT                            8/6/2019liver cirrhosis with ascites with chronic                            splenomegaly,diverticulosis. R/O UGI source of                            bleeding. Providers:                [name], MD, Zenon Mayo, RN, Alan Mulder,                            Technician Referring MD:              Medicines:                Monitored Anesthesia Care Complications:            No immediate complications. Estimated Blood Loss:     Estimated blood loss: none. Procedure:  Pre-Anesthesia Assessment:                           - Prior to the procedure, a History and Physical                            was performed, and patient medications and                            allergies were reviewed. The patient's tolerance of                            previous anesthesia was also reviewed. The risks                            and benefits  of the procedure and the sedation                            options and risks were discussed with the patient.                            All questions were answered, and informed consent                            was obtained. Prior Anticoagulants: The patient has                            taken no previous anticoagulant or antiplatelet                            agents. ASA Grade Assessment: III - A patient with                            severe systemic disease. After reviewing the risks                            and benefits, the patient was deemed in                            satisfactory condition to undergo the procedure.                           After obtaining informed consent, the endoscope was                            passed under direct vision. Throughout the                            procedure, the patient's blood pressure, pulse, and                            oxygen saturations were monitored continuously. The  [id number]-H190 ([id number]) Olympus Adult EGD was introduced                            through the mouth, and advanced to the second part                            of duodenum. The upper GI endoscopy was                            accomplished without difficulty. The patient                            tolerated the procedure well. Scope In: Scope Out: Findings:      Diffuse moderate inflammation characterized by erythema was found in the       gastric body and in the gastric antrum. Biopsies were taken with a cold       forceps for histology. Estimated blood loss: none.      Scattered mild mucosal changes characterized by few white specks were       found (? importance, doubt candida) in the second portion of the       duodenum. Biopsies were taken with a cold forceps for histology.      The exam was otherwise without abnormality. No esophageal or fundal       varices. Impression:               - Gastritis. Biopsied.                            - Mucosal changes in the duodenum. Biopsied.                           - No esophageal or fundal varices. No active                            bleeding. Recommendation:           - Return patient to hospital ward for ongoing care.                           - Resume previous diet.                           - Continue present medications.                           - Await pathology results.                           - she doesnot want colonoscopy. Procedure Code(s):        --- Professional ---                           [phone], Esophagogastroduodenoscopy, flexible,                            transoral; with biopsy, single or multiple Diagnosis Code(s):        ---  Professional ---                           K29.70, Gastritis, unspecified, without bleeding                           K31.89, Other diseases of stomach and duodenum                           R10.13, Epigastric pain CPT copyright 2017 American Medical Association. All rights reserved. The codes documented in this report are preliminary and upon coder review may  be revised to meet current compliance requirements. [name], MD 05/26/2018 12:40:38 PM This report has been signed electronically. Number of Addenda: 0

## 2018-05-26 NOTE — Progress Notes (Signed)
Pt down to EGD

## 2018-05-26 NOTE — Interval H&P Note (Signed)
History and Physical Interval Note:  05/26/2018 11:54 AM  Amanda [name]  has presented today for surgery, with the diagnosis of anemia, cirrhosis  The various methods of treatment have been discussed with the patient and family. After consideration of risks, benefits and other options for treatment, the patient has consented to  Procedure(s): ESOPHAGOGASTRODUODENOSCOPY (EGD) (N/A) as a surgical intervention .  The patient's history has been reviewed, patient examined, no change in status, stable for surgery.  I have reviewed the patient's chart and labs.  Questions were answered to the patient's satisfaction.     [name]

## 2018-05-26 NOTE — NC FL2 (Signed)
[place] LEVEL OF CARE SCREENING TOOL     IDENTIFICATION  Patient Name: Amanda [name] Birthdate: 1941-07-09 Sex: female Admission Date (Current Location): 05/23/2018  [facility] and [place] Number:  Herbalist and Address:  [facility], [place] [address], [place], [place] [address]      Provider Number: [id number]  Attending Physician Name and Address:  Shelly Coss, MD  Relative Name and Phone Number:  [name], spouse, [phone]    Current Level of Care: Hospital Recommended Level of Care: [place] Prior Approval Number:    Date Approved/Denied:   PASRR Number: [id number]  Discharge Plan: SNF    Current Diagnoses: Patient Active Problem List   Diagnosis Date Noted  . Acute blood loss anemia 05/23/2018  . Dehydration 03/22/2018  . AKI (acute kidney injury) ([place]) 03/22/2018  . CVA (cerebral vascular accident) ([place]) 03/22/2018  . Hip injury, left, subsequent encounter 11/08/2017  . Iron deficiency anemia 12/22/2016  . OSA (obstructive sleep apnea) 04/21/2016  . Osteopenia 04/07/2016  . Lung nodule, solitary 02/05/2016  . Aortic dilatation ([place]) 02/05/2016  . Dyspnea 01/28/2016  . Allergic rhinitis 01/23/2016  . Hepatic cirrhosis ([place]) 10/28/2015  . Irritable bowel syndrome 08/12/2015  . Obesity (BMI 30-39.9) 04/30/2015  . Diabetic retinopathy of both eyes ([place]) 04/18/2015  . Former smoker 04/18/2015  . GAD (generalized anxiety disorder) 04/18/2015  . Status post primary angioplasty with coronary stent 04/18/2015  . Coronary artery disease involving native coronary artery 03/01/2015  . Acute diastolic heart failure ([place]) 02/01/2015  . ST elevation myocardial infarction (STEMI) involving left circumflex coronary artery in recovery phase ([place]) 01/27/2015  . Idioventricular rhythm ([place])   . Diabetic peripheral neuropathy associated with type 2 diabetes mellitus ([place]) 01/23/2015  . Type 2 diabetes  mellitus with diabetic polyneuropathy ([place]) 01/23/2015  . Thrombocytosis ([place]) 07/04/2014  . Cholelithiasis 02/07/2014  . Chronic diarrhea 01/29/2014  . [name] ([place]) 05/31/2013  . Essential hypertension 05/31/2013  . History of Bell's palsy 05/26/2013  . DERMATITIS, ATOPIC 11/16/2010  . DIZZINESS 11/16/2010  . DIASTOLIC DYSFUNCTION 80/12/4915  . Hyperlipidemia 12/15/2009  . Essential hypertension, benign 11/24/2009  . PSORIASIS 11/24/2009  . Depression 09/13/2009  . 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[place]) 07/08/2008    Orientation RESPIRATION BLADDER Height & Weight     Self, Time, Situation, Place  O2(Nasal cannula 2L) Incontinent Weight: 98.7 kg Height:  5' 4"  (162.6 cm)  BEHAVIORAL SYMPTOMS/MOOD NEUROLOGICAL BOWEL NUTRITION STATUS      Continent Diet(Please see DC Summary)  AMBULATORY STATUS COMMUNICATION OF NEEDS Skin   Limited Assist Verbally Surgical wounds(Closed incision on abdomen)                       Personal Care Assistance Level of Assistance  Bathing, Feeding, Dressing Bathing Assistance: Limited assistance Feeding assistance: Independent Dressing Assistance: Limited assistance     Functional Limitations Info  Sight, Hearing, Speech Sight Info: Adequate Hearing Info: Impaired Speech Info: Adequate    SPECIAL CARE FACTORS FREQUENCY  PT (By licensed PT), OT (By licensed OT)     PT Frequency: 5x/week OT Frequency: 3x/week            Contractures      Additional Factors Info  Code Status, Allergies, Insulin Sliding Scale, Isolation Precautions Code Status Info: DNR Allergies Info:  Penicillins, Lisinopril, Tape, Doxycycline, Latex   Insulin Sliding Scale Info: 3x daily with meals Isolation Precautions Info: MRSA  in the nose treated with bactroban     Current Medications (05/26/2018):  This is the current hospital active medication list Current Facility-Administered Medications  Medication Dose Route Frequency  Provider Last Rate Last Dose  . 0.9 %  sodium chloride infusion (Manually program via Guardrails IV Fluids)   Intravenous Once Shelly Coss, MD      . 0.9 %  sodium chloride infusion   Intravenous PRN Shelly Coss, MD   Stopped at 05/25/18 2300  . acetaminophen (TYLENOL) tablet 650 mg  650 mg Oral Q6H PRN Sharene Butters E, PA-C       Or  . acetaminophen (TYLENOL) suppository 650 mg  650 mg Rectal Q6H PRN Rondel Jumbo, PA-C      . amLODipine (NORVASC) tablet 10 mg  10 mg Oral Daily Shelly Coss, MD   10 mg at 05/26/18 1556  . Chlorhexidine Gluconate Cloth 2 % PADS 6 each  6 each Topical Q0600 Shelly Coss, MD   6 each at 05/26/18 0446  . diphenoxylate-atropine (LOMOTIL) 2.5-0.025 MG per tablet 1-2 tablet  1-2 tablet Oral BID PRN Rondel Jumbo, PA-C      . fluconazole (DIFLUCAN) tablet 100 mg  100 mg Oral Daily Reginia Naas, RPH   100 mg at 05/26/18 1556  . [START ON 05/27/2018] furosemide (LASIX) tablet 40 mg  40 mg Oral Daily Adhikari, Amrit, MD      . insulin aspart (novoLOG) injection 0-9 Units  0-9 Units Subcutaneous TID WC Rondel Jumbo, PA-C   2 Units at 05/26/18 [phone]  . lipase/protease/amylase (CREON) capsule 36,000 Units  36,000 Units Oral TID WC Rondel Jumbo, PA-C   36,000 Units at 05/26/18 1557  . methylPREDNISolone sodium succinate (SOLU-MEDROL) 40 mg/mL injection 40 mg  40 mg Intravenous Daily Shelly Coss, MD   40 mg at 05/26/18 0841  . morphine 2 MG/ML injection 1 mg  1 mg Intravenous Q4H PRN Rondel Jumbo, PA-C      . mupirocin ointment (BACTROBAN) 2 % 1 application  1 application Nasal BID Shelly Coss, MD   1 application at 02/25/01 [phone]  . ondansetron (ZOFRAN) tablet 4 mg  4 mg Oral Q6H PRN Rondel Jumbo, PA-C       Or  . ondansetron (ZOFRAN) injection 4 mg  4 mg Intravenous Q6H PRN Rondel Jumbo, PA-C      . pantoprazole (PROTONIX) injection 40 mg  40 mg Intravenous Q12H Willia Craze, NP   40 mg at 05/26/18 0842  . rosuvastatin  (CRESTOR) tablet 20 mg  20 mg Oral q1800 Rondel Jumbo, PA-C   20 mg at 05/25/18 1847  . spironolactone (ALDACTONE) tablet 50 mg  50 mg Oral Daily Shelly Coss, MD   50 mg at 05/26/18 1555     Discharge Medications: Please see discharge summary for a list of discharge medications.  Relevant Imaging Results:  Relevant Lab Results:   Additional Information SSN: [place], [place]

## 2018-05-27 DIAGNOSIS — B370 Candidal stomatitis: Secondary | ICD-10-CM

## 2018-05-27 LAB — CBC WITH DIFFERENTIAL/PLATELET
BASOS ABS: 0.5 10*3/uL — AB (ref 0.0–0.1)
BASOS PCT: 1 %
EOS PCT: 0 %
Eosinophils Absolute: 0 10*3/uL (ref 0.0–0.7)
HCT: 32.2 % — ABNORMAL LOW (ref 36.0–46.0)
HEMOGLOBIN: 9.7 g/dL — AB (ref 12.0–15.0)
LYMPHS PCT: 3 %
Lymphs Abs: 1.5 10*3/uL (ref 0.7–4.0)
MCH: 26.4 pg (ref 26.0–34.0)
MCHC: 30.1 g/dL (ref 30.0–36.0)
MCV: 87.7 fL (ref 78.0–100.0)
MONOS PCT: 4 %
Monocytes Absolute: 2 10*3/uL — ABNORMAL HIGH (ref 0.1–1.0)
Neutro Abs: 45.1 10*3/uL — ABNORMAL HIGH (ref 1.7–7.7)
Neutrophils Relative %: 92 %
Platelets: 542 10*3/uL — ABNORMAL HIGH (ref 150–400)
RBC: 3.67 MIL/uL — ABNORMAL LOW (ref 3.87–5.11)
RDW: 19.8 % — ABNORMAL HIGH (ref 11.5–15.5)
WBC: 49.1 10*3/uL — ABNORMAL HIGH (ref 4.0–10.5)

## 2018-05-27 LAB — GLUCOSE, CAPILLARY
GLUCOSE-CAPILLARY: 220 mg/dL — AB (ref 70–99)
GLUCOSE-CAPILLARY: 258 mg/dL — AB (ref 70–99)
Glucose-Capillary: 500 mg/dL — ABNORMAL HIGH (ref 70–99)
Glucose-Capillary: 486 mg/dL — ABNORMAL HIGH (ref 70–99)

## 2018-05-27 LAB — BASIC METABOLIC PANEL
Anion gap: 9 (ref 5–15)
BUN: 24 mg/dL — AB (ref 8–23)
CHLORIDE: 106 mmol/L (ref 98–111)
CO2: 25 mmol/L (ref 22–32)
Calcium: 8.6 mg/dL — ABNORMAL LOW (ref 8.9–10.3)
Creatinine, Ser: 1.33 mg/dL — ABNORMAL HIGH (ref 0.44–1.00)
GFR calc Af Amer: 43 mL/min — ABNORMAL LOW (ref >60)
GFR calc non Af Amer: 37 mL/min — ABNORMAL LOW (ref >60)
Glucose, Bld: 248 mg/dL — ABNORMAL HIGH (ref 70–99)
POTASSIUM: 3.6 mmol/L (ref 3.5–5.1)
SODIUM: 140 mmol/L (ref 135–145)

## 2018-05-27 MED ORDER — INSULIN ASPART 100 UNIT/ML ~~LOC~~ SOLN: 0.0000 [IU] | Freq: Three times a day (TID) | SUBCUTANEOUS | 11 refills | Status: DC

## 2018-05-27 MED ORDER — FUROSEMIDE 20 MG PO TABS: 20.0000 mg | ORAL_TABLET | Freq: Every day | ORAL | 0 refills | Status: DC

## 2018-05-27 MED ORDER — METOPROLOL TARTRATE 12.5 MG HALF TABLET
12.5000 mg | ORAL_TABLET | Freq: Two times a day (BID) | ORAL | Status: DC
  Administered 2018-05-27 (×2): 12.5 mg via ORAL
  Filled 2018-05-27 (×3): qty 1

## 2018-05-27 MED ORDER — DULAGLUTIDE 0.75 MG/0.5ML ~~LOC~~ SOAJ: 0.7500 mg | SUBCUTANEOUS | 1 refills | Status: DC

## 2018-05-27 MED ORDER — ZOLPIDEM TARTRATE 5 MG PO TABS
5.0000 mg | ORAL_TABLET | Freq: Once | ORAL | Status: AC
  Administered 2018-05-27: 5 mg via ORAL
  Filled 2018-05-27: qty 1

## 2018-05-27 MED ORDER — INSULIN ASPART 100 UNIT/ML ~~LOC~~ SOLN
10.0000 [IU] | Freq: Once | SUBCUTANEOUS | Status: AC
  Administered 2018-05-27: 10 [IU] via SUBCUTANEOUS

## 2018-05-27 MED ORDER — SPIRONOLACTONE 50 MG PO TABS: 25.0000 mg | ORAL_TABLET | Freq: Every day | ORAL | 0 refills | Status: DC

## 2018-05-27 MED ORDER — METOPROLOL TARTRATE 25 MG PO TABS: 12.5000 mg | ORAL_TABLET | Freq: Two times a day (BID) | ORAL | 0 refills | Status: DC

## 2018-05-27 MED ORDER — ACETAMINOPHEN 325 MG PO TABS: 650.0000 mg | ORAL_TABLET | Freq: Four times a day (QID) | ORAL | Status: DC | PRN

## 2018-05-27 MED ORDER — FLUCONAZOLE 100 MG PO TABS: 100.0000 mg | ORAL_TABLET | Freq: Every day | ORAL | 0 refills | Status: DC

## 2018-05-27 MED ORDER — SPIRONOLACTONE 25 MG PO TABS: 50.0000 mg | ORAL_TABLET | Freq: Every day | ORAL | Status: DC

## 2018-05-27 MED ORDER — INSULIN ASPART 100 UNIT/ML ~~LOC~~ SOLN
6.0000 [IU] | Freq: Once | SUBCUTANEOUS | Status: AC
  Administered 2018-05-27: 6 [IU] via SUBCUTANEOUS

## 2018-05-27 MED ORDER — SACCHAROMYCES BOULARDII 250 MG PO CAPS
250.0000 mg | ORAL_CAPSULE | Freq: Two times a day (BID) | ORAL | Status: DC
  Administered 2018-05-27 – 2018-05-28 (×3): 250 mg via ORAL
  Filled 2018-05-27 (×3): qty 1

## 2018-05-27 MED ORDER — PANTOPRAZOLE SODIUM 40 MG PO TBEC: 40.0000 mg | DELAYED_RELEASE_TABLET | Freq: Every day | ORAL | 1 refills | Status: DC

## 2018-05-27 MED ORDER — FUROSEMIDE 20 MG PO TABS: 20.0000 mg | ORAL_TABLET | Freq: Every day | ORAL | Status: DC

## 2018-05-27 NOTE — Progress Notes (Addendum)
[facility] Progress Note   Chief Complaint:   Symptomatic anemia, heme positive stools    SUBJECTIVE:    feels okay . No abdominal pain. No diarrhea.    ASSESSMENT AND PLAN:   43. 77 yo female with multiple medical problems and NASH cirrhosis complicated with portal HTN admitted with decompensated cirrhosis,  symptomatic [place] anemia,heme positive stools. Hgb 6.2, down from baseline ~ 12. No overt GI bleeding. Last colonoscopy several years ago. -got 2 more units of blood yesterday for a total of 3. Hgb now up to 9.7.  -EGD yesterday - No varices. Gastritis / mucosal -changes in duodenum. Biopsies pending -continue PPI, will change to PO -S/p paracentesis, only 2.7 L removed. WBC 434 / neut 45 % - doesn't quite meet SBP criteria -starting low dose diuretics. Needs daily weights, I+O -continue 2 grams sodium diet. Placing order for dietician to see before discharge  2. Polycytemia vera on Hydrea at home.   3. Chronic diarrhea, stool studies are pending. Not having diarrhea now and it wasn't any worse than normal anyway.  -will d/c stool studies for now.   4. Oral candidiasis.  -Started diflucan two days ago   Attending physician's note   I have taken an interval history, reviewed the chart and examined the patient. I agree with the Advanced Practitioner's note, impression and recommendations.   77 year old with multiple medical problems with NASH decompensated liver cirrhosis with portal hypertension with ascites status post paracenteses (2.7 Lit), negative EGD for esophageal varices. No HE. Plan: Sodium and fluid restricted diet, continue diuretics, encouraged her to lose weight, follow-up in the GI clinic as an outpatient for further evaluation. May need colon as outpt. Will sign off for now.  Carmell Austria, MD  OBJECTIVE:     Vital signs in last 24 hours: Temp:  [98.3 F (36.8 C)-98.7 F (37.1 C)] 98.3 F (36.8 C) (08/10 0513) Pulse Rate:  [87-102] 96  (08/10 0513) Resp:  [16-25] 25 (08/10 0513) BP: (108-181)/(34-137) 155/92 (08/10 0914) SpO2:  [92 %-99 %] 99 % (08/10 0513) Last BM Date: 05/23/18 General:   Alert, female  in NAD EENT:  Normal hearing, non icteric sclera, conjunctive pink.  Heart:  Regular rate and rhythm; no lower extremity edema Pulm: Normal respiratory effort, lungs CTA bilaterally without wheezes or crackles. Abdomen:  Soft, nondistended, nontender.  Normal bowel sounds, no masses felt.     Neurologic:  Alert and  oriented x4;  grossly normal neurologically. Psych:  Pleasant, cooperative.  Normal mood and affect.   Intake/Output from previous day: 08/09 0701 - 08/10 0700 In: 200 [I.V.:200] Out: -  Intake/Output this shift: Total I/O In: 240 [P.O.:240] Out: -   Lab Results: Recent Labs    05/25/18 0538 05/26/18 0549 05/27/18 0520  WBC 40.3* 29.4* 49.1*  HGB 6.9* 8.7* 9.7*  HCT 23.7* 29.1* 32.2*  PLT 562* 441* 542*   BMET Recent Labs    05/25/18 0538 05/26/18 0549 05/27/18 0520  NA 142 142 140  K 4.1 3.4* 3.6  CL 109 106 106  CO2 24 25 25   GLUCOSE 177* 169* 248*  BUN 14 14 24*  CREATININE 1.08* 1.23* 1.33*  CALCIUM 8.5* 8.3* 8.6*   LFT Recent Labs    05/26/18 0549  PROT 5.2*  ALBUMIN 2.9*  AST 18  ALT 7  ALKPHOS 97  BILITOT 1.9*   PT/INR Recent Labs    05/24/18 1849  LABPROT 17.6*  INR 1.46   Hepatitis Panel  No results for input(s): HEPBSAG, HCVAB, HEPAIGM, HEPBIGM in the last 72 hours.  Ir Paracentesis  Result Date: 05/25/2018 INDICATION: History of NASH cirrhosis. Abdominal distention. Ascites. Request for diagnostic and therapeutic paracentesis. EXAM: ULTRASOUND GUIDED LEFT LOWER QUADRANT PARACENTESIS MEDICATIONS: None. COMPLICATIONS: None immediate. PROCEDURE: Informed written consent was obtained from the patient after a discussion of the risks, benefits and alternatives to treatment. A timeout was performed prior to the initiation of the procedure. Initial ultrasound  scanning demonstrates a large amount of ascites within the left lower abdominal quadrant. The left lower abdomen was prepped and draped in the usual sterile fashion. 1% lidocaine with epinephrine was used for local anesthesia. Following this, a 19 gauge, 10-cm, Yueh catheter was introduced. An ultrasound image was saved for documentation purposes. The paracentesis was performed. The catheter was removed and a dressing was applied. The patient tolerated the procedure well without immediate post procedural complication. FINDINGS: A total of approximately 2.7 L of slightly hazy, yellow fluid was removed. Samples were sent to the laboratory as requested by the clinical team. IMPRESSION: Successful ultrasound-guided paracentesis yielding 2.7 liters of peritoneal fluid. Read by: Ascencion Dike PA-C Electronically Signed   By: Lucrezia Europe M.D.   On: 05/25/2018 16:17      Principal Problem:   Acute blood loss anemia Active Problems:   Hyperlipidemia   Depression   Essential hypertension, benign   DIASTOLIC DYSFUNCTION   PSORIASIS   [name] (HCC)   Thrombocytosis (HCC)   Coronary artery disease involving native coronary artery   Hepatic cirrhosis (HCC)   OSA (obstructive sleep apnea)   Chronic diarrhea   Iron deficiency anemia   CVA (cerebral vascular accident) ([place])     LOS: 4 days   Tye Savoy ,NP 05/27/2018, 11:12 AM

## 2018-05-27 NOTE — Discharge Summary (Addendum)
Physician Discharge Summary  Amanda [name] KNL:[id number] DOB: [birth year] DOA: 05/23/2018  PCP: [name], NP  Admit date: 05/23/2018 Discharge date: 05/27/2018  Admitted From: Home Disposition: Nursing home Recommendations for Outpatient Follow-up:  1. Follow up with PCP in 1-2 weeks 2. Please obtain BMP/CBC in one week 3]Please follow up on the biopsy results from the EGD. 4]Follow-up with GI Home Health: None Equipment/Devices: None Discharge Condition stable CODE STATUS DO NOT RESUSCITATE Diet recommendation: Cardiac Brief/Interim Summary:76 y.o. 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 difficile  3 years ago, chronic diarrhea on chronic Imodium, presenting with left lower quadrant abdominal pain, first episode about 2 weeks ago, described as sharp left lower quadrant pain, but these symptoms got worse overnight, for which she sought medical attention.  She denies any gross blood in the stools.  She was feeling nauseous on presentation.  She denies any fever, chills, she had some nausea without vomiting, she denies any chest pain or palpitations, she denies any shortness of breath or cough.  She denies any sick contacts or food poisoning.  She is compliant with her medications.  She denies any dysuria, gross hematuria, vaginal bleeding, lower extremity swelling or calf pain.  She does take aspirin a day, and reports being compliant with her medications.  Of note, her Hydrea was modified a few weeks ago.  She is very anxious, easily irritable, does not want to provide any further information, but no acute mental status changes are noted.  She denies any tobacco, alcohol or recreational drug use.  Denies any pruritic rash Discharge Diagnoses:  Principal Problem:   Acute blood loss anemia Active Problems:   Hyperlipidemia   Depression   Essential hypertension, benign   DIASTOLIC DYSFUNCTION    PSORIASIS   [name] (HCC)   Thrombocytosis (HCC)   Coronary artery disease involving native coronary artery   Hepatic cirrhosis (HCC)   OSA (obstructive sleep apnea)   Chronic diarrhea   Iron deficiency anemia   CVA (cerebral vascular accident) ([place])  Anemia:FOBT was positive . Hemoglobin was 6.2 on presentation.  Her baseline hemoglobin is around 12.  Hemoglobin on the day of discharge is 9.7. S/P 4  units of PRBC so far .Patient denied any bloody stools or frank bleeding. Continue  PPI Hold all NSAIDs including preadmission baby aspirin  Underwent EGD today which showed gastritis , no active bleeding, no esophageal or fundal varices.  She refused  colonoscopy.  Cirrhosis/NASH:She has history of [place] cirrhosis.  She underwent ultrasound-guided paracentesis with removal of 2.7 L of fluid.  Started on Lasix and spinal lactone.  Fluid analysis not suggestive  for SBP.  Acute left lower quadrant pain:No other acute abdominal findings are noted except for diverticulosis as per CT.  Abdominal pain has resolved  Chronic diarrhea: Continue with her Imodium, as well as Creon.  Currently she does not have diarrhea.  And probiotics.  Lower extremity edema Currently on Lasix,spironolactone.    Wheezing: Chest x-ray done  showed mild pulmonary edema.  Started on diuretics.  CKD stage 2-3: On reviewing her past BMP, her creatinine fluctuates from 1.2-1.4.  Currently kidney function on baseline.  [name]: Follow-up with Dr. [name].   XTK:[id number] CPAP nightly  Type II Diabetes with neuropathy :Continue SSI  and restart Trulicity that she was taking at home.  Husband reports that she was taking 0.75 q weekly..Continue Neurontin  Hyperlipidemia:Continue home statins  Hypertension :  Lasix Aldactone and beta-blocker.  Anxiety and depression:Continue Remeron  Oral candidiasis: Continue antifungal Diflucan for 6 more days.  Difficulty in   Ambulation/debility/deconditioning Physical therapy evaluation done.  Recommended skilled nursing facility discharge.   Discharge Instructions   Allergies as of 05/27/2018      Reactions   Penicillins Swelling   Has patient had a PCN reaction causing immediate rash, facial/tongue/throat swelling, SOB or lightheadedness with hypotension: No Has patient had a PCN reaction causing severe rash involving mucus membranes or skin necrosis: No Has patient had a PCN reaction that required hospitalization: No Has patient had a PCN reaction occurring within the last 10 years: No If all of the above answers are "NO", then may proceed with Cephalosporin use.   Lisinopril Cough   Tape Hives   Doxycycline Hives, Swelling, Rash   Latex Hives, Itching, Rash      Medication List    STOP taking these medications   aspirin 325 MG tablet   hydroxyurea 500 MG capsule Commonly known as:  HYDREA     TAKE these medications   ACCU-CHEK FASTCLIX LANCETS Misc Use to check blood sugars 3 times daily   ACCU-CHEK GUIDE w/Device Kit 1 kit by Does not apply route as directed.   acetaminophen 325 MG tablet Commonly known as:  TYLENOL Take 2 tablets (650 mg total) by mouth every 6 (six) hours as needed for mild pain (or Fever >/= 101).   amLODipine 10 MG tablet Commonly known as:  NORVASC Take 1 tablet (10 mg total) by mouth daily.   diphenoxylate-atropine 2.5-0.025 MG tablet Commonly known as:  LOMOTIL Take 1-2 tablets by mouth 2 (two) times daily as needed for diarrhea or loose stools.   fluconazole 100 MG tablet Commonly known as:  DIFLUCAN Take 1 tablet (100 mg total) by mouth daily. Start taking on:  05/28/2018   furosemide 20 MG tablet Commonly known as:  LASIX Take 1 tablet (20 mg total) by mouth daily. Start taking on:  05/28/2018   gabapentin 100 MG capsule Commonly known as:  NEURONTIN Take 1 capsule (100 mg total) by mouth 2 (two) times daily.   glucose blood test strip Use to  check blood sugars 3 times daily   ONETOUCH VERIO test strip Generic drug:  glucose blood USE TO TEST BLOOD SUGAR THREE TIMES DAILY   Insulin Pen Needle 32G X 4 MM Misc USE TO INJECT INSULIN AS DIRECTED.   lipase/protease/amylase 36000 UNITS Cpep capsule Commonly known as:  CREON Take 1 capsule (36,000 Units total) by mouth 3 (three) times daily with meals.   loperamide 2 MG tablet Commonly known as:  IMODIUM A-D Take 1-2 tablets (2-4 mg total) by mouth daily as needed for diarrhea or loose stools.   metoprolol tartrate 25 MG tablet Commonly known as:  LOPRESSOR Take 0.5 tablets (12.5 mg total) by mouth 2 (two) times daily.   mirtazapine 7.5 MG tablet Commonly known as:  REMERON Take 1 tablet (7.5 mg total) by mouth at bedtime.   ondansetron 4 MG disintegrating tablet Commonly known as:  ZOFRAN-ODT Take 1 tablet (4 mg total) by mouth every 8 (eight) hours as needed. What changed:  reasons to take this   promethazine 25 MG tablet Commonly known as:  PHENERGAN Take 0.5 tablets (12.5 mg total) by mouth every 6 (six) hours as needed for nausea or vomiting.   rosuvastatin 20 MG tablet Commonly known as:  CRESTOR TAKE 1 TABLET(20 MG) BY MOUTH DAILY   triamcinolone ointment 0.5 %  Commonly known as:  KENALOG Apply 1 application topically 2 (two) times daily.      Contact information for after-discharge care    Destination    [facility] .   Service:  Skilled Nursing Contact information: [place] [address] [phone]             Allergies  Allergen Reactions  . Penicillins Swelling    Has patient had a PCN reaction causing immediate rash, facial/tongue/throat swelling, SOB or lightheadedness with hypotension: No Has patient had a PCN reaction causing severe rash involving mucus membranes or skin necrosis: No Has patient had a PCN reaction that required hospitalization: No Has patient had a PCN reaction occurring  within the last 10 years: No If all of the above answers are "NO", then may proceed with Cephalosporin use.  Marland Kitchen Lisinopril Cough  . Tape Hives  . Doxycycline Hives, Swelling and Rash  . Latex Hives, Itching and Rash    Consultations: gi  Procedures/Studies: Ct Abdomen Pelvis W Contrast  Result Date: 05/23/2018 CLINICAL DATA:  Left lower quadrant abdominal pain beginning last night and worsening today. EXAM: CT ABDOMEN AND PELVIS WITH CONTRAST TECHNIQUE: Multidetector CT imaging of the abdomen and pelvis was performed using the standard protocol following bolus administration of intravenous contrast. CONTRAST:  145m ISOVUE-300 IOPAMIDOL (ISOVUE-300) INJECTION 61% COMPARISON:  01/04/2018 FINDINGS: The patient is scanned in the right-side-down decubitus position. lower chest: Negative Hepatobiliary: Chronic cirrhosis of the liver. No acute liver finding. No calcified gallstones. Pancreas: No lesion of the pancreas identified. Spleen: Splenomegaly as seen previously. Adrenals/Urinary Tract: Normal adrenal glands. Normal kidneys. Normal bladder. Stomach/Bowel: No acute bowel pathology is seen. Patient has diverticulosis of the sigmoid colon but I do not see evidence of diverticulitis. No bowel obstruction. Vascular/Lymphatic: Aortic atherosclerosis. No aneurysm. IVC is normal. No retroperitoneal adenopathy. Reproductive: No pelvic mass. Other: Increase in amount of ascites, freely distributed and layering dependently due to the decubitus positioning. Musculoskeletal: Negative IMPRESSION: Patient is scanned in the right-side-down decubitus position. Marked increase in the amount of ascites, freely distributed within the peritoneal space and layering dependently due to the decubitus positioning. 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 Flow is present in the portal system, but I cannot determine direction by CT. Diverticulosis but no evidence of diverticulitis. Electronically Signed   By: MNelson ChimesM.D.   On: 05/23/2018 16:12   Dg Chest Port 1 View  Result Date: 05/25/2018 CLINICAL DATA:  Shortness of breath. EXAM: PORTABLE CHEST 1 VIEW COMPARISON:  Radiographs and CT scan of March 23, 2018. FINDINGS: Stable cardiomegaly with central pulmonary vascular congestion is noted. Mildly increased interstitial densities are noted bilaterally suggesting possible pulmonary edema. No pneumothorax or significant pleural effusion is noted. Bony thorax is unremarkable. IMPRESSION: Stable cardiomegaly with central pulmonary vascular congestion and probable mild bilateral pulmonary edema. Electronically Signed   By: JMarijo Conception M.D.   On: 05/25/2018 12:54   Ir Paracentesis  Result Date: 05/25/2018 INDICATION: History of NASH cirrhosis. Abdominal distention. Ascites. Request for diagnostic and therapeutic paracentesis. EXAM: ULTRASOUND GUIDED LEFT LOWER QUADRANT PARACENTESIS MEDICATIONS: None. COMPLICATIONS: None immediate. PROCEDURE: Informed written consent was obtained from the patient after a discussion of the risks, benefits and alternatives to treatment. A timeout was performed prior to the initiation of the procedure. Initial ultrasound scanning demonstrates a large amount of ascites within the left lower abdominal quadrant. The left lower abdomen was prepped and draped in the usual sterile  fashion. 1% lidocaine with epinephrine was used for local anesthesia. Following this, a 19 gauge, 10-cm, Yueh catheter was introduced. An ultrasound image was saved for documentation purposes. The paracentesis was performed. The catheter was removed and a dressing was applied. The patient tolerated the procedure well without immediate post procedural complication. FINDINGS: A total of approximately 2.7 L of slightly hazy, yellow fluid was removed. Samples were sent to the laboratory as requested by the clinical team. IMPRESSION: Successful ultrasound-guided paracentesis yielding 2.7 liters of peritoneal fluid. Read  by: Ascencion Dike PA-C Electronically Signed   By: Lucrezia Europe M.D.   On: 05/25/2018 16:17    (Echo, Carotid, EGD, Colonoscopy, ERCP)    Subjective:   Discharge Exam: Vitals:   05/27/18 0513 05/27/18 0914  BP: (!) 154/137 (!) 155/92  Pulse: 96   Resp: (!) 25   Temp: 98.3 F (36.8 C)   SpO2: 99%    Vitals:   05/26/18 1538 05/26/18 2112 05/27/18 0513 05/27/18 0914  BP: (!) 158/63 (!) 134/47 (!) 154/137 (!) 155/92  Pulse: (!) 102 94 96   Resp: 19 20 (!) 25   Temp: 98.7 F (37.1 C) 98.7 F (37.1 C) 98.3 F (36.8 C)   TempSrc: Oral Oral Oral   SpO2: 94% 94% 99%   Weight:      Height:       Awake denies any abdominal pain nausea vomiting or diarrhea. General: Pt is alert, awake, not in acute distress Cardiovascular: RRR, S1/S2 +, no rubs, no gallops Respiratory: CTA bilaterally, no wheezing, no rhonchi Abdominal: Soft, NT, ND, bowel sounds + Extremities: no edema, no cyanosis    The results of significant diagnostics from this hospitalization (including imaging, microbiology, ancillary and laboratory) are listed below for reference.     Microbiology: Recent Results (from the past 240 hour(s))  MRSA PCR Screening     Status: Abnormal   Collection Time: 05/23/18 11:51 PM  Result Value Ref Range Status   MRSA by PCR POSITIVE (A) NEGATIVE Final    Comment:        The GeneXpert MRSA Assay (FDA approved for NASAL specimens only), is one component of a comprehensive MRSA colonization surveillance program. It is not intended to diagnose MRSA infection nor to guide or monitor treatment for MRSA infections. RESULT CALLED TO, READ BACK BY AND VERIFIED WITH: Toy Cookey RN [id number] 05/24/18 A BROWNING Performed at [facility], [place] [address] [place], [place] [address]   Culture, body fluid-bottle     Status: None (Preliminary result)   Collection Time: 05/25/18  3:58 PM  Result Value Ref Range Status   Specimen Description PERITONEAL  Final   Special Requests NONE   Final   Culture   Final    NO GROWTH 2 DAYS Performed at [facility] [address], [place], [place] [address]    Report Status PENDING  Incomplete  Gram stain     Status: None   Collection Time: 05/25/18  3:58 PM  Result Value Ref Range Status   Specimen Description PERITONEAL  Final   Special Requests NONE  Final   Gram Stain   Final    RARE WBC PRESENT,BOTH PMN AND MONONUCLEAR NO ORGANISMS SEEN Performed at [facility], [address], [place],  [address]    Report Status 05/25/2018 FINAL  Final     Labs: BNP (last 3 results) Recent Labs    03/23/18 0221  BNP 568.1*   Basic Metabolic  Panel: Recent Labs  Lab 05/23/18 1042 05/24/18 0651 05/25/18 0538 05/26/18 0549 05/27/18 0520  NA 141 140 142 142 140  K 4.2 4.1 4.1 3.4* 3.6  CL 110 109 109 106 106  CO2 22 20* 24 25 25   GLUCOSE 210* 182* 177* 169* 248*  BUN 15 14 14 14  24*  CREATININE 1.16* 1.07* 1.08* 1.23* 1.33*  CALCIUM 8.4* 8.4* 8.5* 8.3* 8.6*   Liver Function Tests: Recent Labs  Lab 05/23/18 1042 05/26/18 0549  AST 23 18  ALT 9 7  ALKPHOS 119 97  BILITOT 1.3* 1.9*  PROT 5.6* 5.2*  ALBUMIN 3.1* 2.9*   Recent Labs  Lab 05/23/18 1042  LIPASE 41   Recent Labs  Lab 05/25/18 0538  AMMONIA 34   CBC: Recent Labs  Lab 05/23/18 1042  05/24/18 0025 05/24/18 0651 05/25/18 0538 05/26/18 0549 05/27/18 0520  WBC 46.8*   < > 44.1* 48.1* 40.3* 29.4* 49.1*  NEUTROABS 42.5*  --   --   --  35.9* 25.8* 45.1*  HGB 6.2*   < > 7.0* 7.1* 6.9* 8.7* 9.7*  HCT 22.1*   < > 23.9* 24.3* 23.7* 29.1* 32.2*  MCV 90.2   < > 90.5 91.0 90.5 89.5 87.7  PLT 703*   < > 635* 679* 562* 441* 542*   < > = values in this interval not displayed.   Cardiac Enzymes: No results for input(s): CKTOTAL, CKMB, CKMBINDEX, TROPONINI in the last 168 hours. BNP: Invalid input(s): POCBNP CBG: Recent Labs  Lab 05/26/18 1054 05/26/18 1704 05/26/18 2110 05/27/18 0732 05/27/18 1150  GLUCAP 182* 379* 391*  220* 258*   D-Dimer No results for input(s): DDIMER in the last 72 hours. Hgb A1c No results for input(s): HGBA1C in the last 72 hours. Lipid Profile No results for input(s): CHOL, HDL, LDLCALC, TRIG, CHOLHDL, LDLDIRECT in the last 72 hours. Thyroid function studies No results for input(s): TSH, T4TOTAL, T3FREE, THYROIDAB in the last 72 hours.  Invalid input(s): FREET3 Anemia work up No results for input(s): VITAMINB12, FOLATE, FERRITIN, TIBC, IRON, RETICCTPCT in the last 72 hours. Urinalysis    Component Value Date/Time   COLORURINE YELLOW 05/23/2018 1341   APPEARANCEUR HAZY (A) 05/23/2018 1341   LABSPEC 1.013 05/23/2018 1341   PHURINE 5.0 05/23/2018 1341   GLUCOSEU NEGATIVE 05/23/2018 1341   GLUCOSEU NEGATIVE 01/10/2018 1217   HGBUR LARGE (A) 05/23/2018 1341   HGBUR large 05/07/2010 0845   BILIRUBINUR NEGATIVE 05/23/2018 1341   BILIRUBINUR neg 01/26/2016 1003   KETONESUR NEGATIVE 05/23/2018 1341   PROTEINUR 100 (A) 05/23/2018 1341   UROBILINOGEN 0.2 01/10/2018 1217   NITRITE NEGATIVE 05/23/2018 1341   LEUKOCYTESUR NEGATIVE 05/23/2018 1341   Sepsis Labs Invalid input(s): PROCALCITONIN,  WBC,  LACTICIDVEN Microbiology Recent Results (from the past 240 hour(s))  MRSA PCR Screening     Status: Abnormal   Collection Time: 05/23/18 11:51 PM  Result Value Ref Range Status   MRSA by PCR POSITIVE (A) NEGATIVE Final    Comment:        The GeneXpert MRSA Assay (FDA approved for NASAL specimens only), is one component of a comprehensive MRSA colonization surveillance program. It is not intended to diagnose MRSA infection nor to guide or monitor treatment for MRSA infections. RESULT CALLED TO, READ BACK BY AND VERIFIED WITH: Toy Cookey RN 0234 05/24/18 A BROWNING Performed at [facility], [place] [address], [place], [place] [address]   Culture, body fluid-bottle     Status: None (Preliminary  result)   Collection Time: 05/25/18  3:58 PM  Result Value Ref Range Status    Specimen Description PERITONEAL  Final   Special Requests NONE  Final   Culture   Final    NO GROWTH 2 DAYS Performed at [facility], [address], [place], [place] [address]    Report Status PENDING  Incomplete  Gram stain     Status: None   Collection Time: 05/25/18  3:58 PM  Result Value Ref Range Status   Specimen Description PERITONEAL  Final   Special Requests NONE  Final   Gram Stain   Final    RARE WBC PRESENT,BOTH PMN AND MONONUCLEAR NO ORGANISMS SEEN Performed at [facility], [address], [place], [place] [address]    Report Status 05/25/2018 FINAL  Final     Time coordinating discharge: 34 minutes  SIGNED:   Georgette Shell, MD  [facility] 05/27/2018, 12:06 PM Pager   If 7PM-7AM, please contact night-coverage [email or web address] Password [email or web address]

## 2018-05-27 NOTE — Clinical Social Work Note (Addendum)
CSW just received insurance authorization for patient to go to [facility] number is [phone].  CSW spoke to [place] at [facility] [phone] and she can not accept patient today, but can accept her tomorrow.  CSW updated physician, bedside nurse, and patient's husband, physician will cancel discharge for today.  CSW to update weekend CSW to help facilitate discharge planning tomorrow.  Jones Broom. [place], MSW, [place]  05/27/2018 5:09 PM

## 2018-05-27 NOTE — Progress Notes (Signed)
MD notified of patient's blood sugar being 500.

## 2018-05-27 NOTE — Progress Notes (Signed)
MD notified of the patient receiving 9 units. And was asked if she wants the patient to receive anymore.

## 2018-05-27 NOTE — Progress Notes (Signed)
Patient refused CPAP for tonight 

## 2018-05-27 NOTE — Plan of Care (Signed)
Nutrition Education Note  RD consulted for nutrition education regarding cirrhosis.   RD provided "Low Sodium Nutrition Therapy" handout from the Academy of [employer] and Dietetics. Reviewed patient's dietary recall. Provided examples on ways to decrease sodium intake in diet. Discouraged intake of processed foods and use of salt shaker. Encouraged fresh fruits and vegetables as well as whole grain sources of carbohydrates to maximize fiber intake.   RD discussed why it is important for patient to adhere to diet recommendations, and emphasized the role of fluids, foods to avoid, and importance of weighing self daily. Teach back method used.  Expect fair compliance.  Body mass index is 37.35 kg/m. Pt meets criteria for obese based on current BMI.  Current diet order is low sodium heart healthy, patient is consuming approximately 75-100% of meals at this time. Labs and medications reviewed. No further nutrition interventions warranted at this time. RD contact information provided. If additional nutrition issues arise, please re-consult Rd.   Mariana Single RD, LDN Clinical Nutrition Pager # [phone]

## 2018-05-27 NOTE — Progress Notes (Signed)
MD verbal order to give 6 more units.

## 2018-05-27 NOTE — Clinical Social Work Note (Signed)
CSW presented bed offers to patient's husband [name], and he chose [facility].  CSW contacted [facility] and they can accept patient pending insurance approval.  CSW contacted insurance company to begin authorization.  CSW awaiting insurance approval, CSW to continue to follow patient's progress throughout discharge planning.  Jones Broom. [place], MSW, [place]  05/27/2018 11:32 AM

## 2018-05-27 NOTE — Progress Notes (Signed)
Physical Therapy Treatment Patient Details Name: Amanda [name] MRN: [id number] DOB: 09/24/1941 Today's Date: 05/27/2018    History of Present Illness Patient is a 77 y/o female presenting with symptomatic anemia on 05/23/18. 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     PT Comments    Pt making slow progress towards her goals today. Pt limited in safe mobility with increased DoE with ambulation as well as decreased strength and endurance. Pt requires supervision for bed mobility, and min A for transfers to RW. Pt ambulated 18 feet initially with min A however it progressed to modA for steadying as DoE increased in returning to bed. SNF level d/c plans remain appropriate at this time for pt to improve strength and endurance before returning home.      Follow Up Recommendations  SNF;Supervision/Assistance - 24 hour     Equipment Recommendations  None recommended by PT    Recommendations for Other Services OT consult     Precautions / Restrictions Precautions Precautions: Fall Restrictions Weight Bearing Restrictions: No    Mobility  Bed Mobility Overal bed mobility: Needs Assistance Bed Mobility: Supine to Sit     Supine to sit: Supervision     General bed mobility comments: for general safety  Transfers Overall transfer level: Needs assistance Equipment used: Rolling walker (2 wheeled) Transfers: Sit to/from [facility] Sit to Stand: Min assist         General transfer comment: Min A to power up and steady at [employer]  Ambulation/Gait Ambulation/Gait assistance: Mod assist;Min assist Gait Distance (Feet): 18 Feet Assistive device: Rolling walker (2 wheeled) Gait Pattern/deviations: Step-through pattern;Decreased stride length;Shuffle;Trunk flexed Gait velocity: slowed Gait velocity interpretation: <1.31 ft/sec, indicative of  household ambulator General Gait Details: min A progressing to modA with increased distance for steadying with RW, vc for upright posture, 2x standing rest breaks secondary to 3/4 DoE       Balance Overall balance assessment: Mild deficits observed, not formally tested                                          Cognition Arousal/Alertness: Awake/alert Behavior During Therapy: WFL for tasks assessed/performed;Flat affect Overall Cognitive Status: Within Functional Limits for tasks assessed                                           General Comments General comments (skin integrity, edema, etc.): at rest HR 94 bpm, with ambulation 105 bpm      Pertinent Vitals/Pain Pain Assessment: No/denies pain     PT Goals (current goals can now be found in the care plan section) Acute Rehab PT Goals Patient Stated Goal: regain strength PT Goal Formulation: With patient Time For Goal Achievement: 06/07/18 Potential to Achieve Goals: Fair Progress towards PT goals: Progressing toward goals    Frequency    Min 2X/week      PT Plan Current plan remains appropriate    Co-evaluation              AM-PAC PT "6 Clicks" Daily Activity  Outcome Measure  Difficulty turning over in bed (including adjusting bedclothes, sheets and blankets)?: A Lot Difficulty moving from  lying on back to sitting on the side of the bed? : A Lot Difficulty sitting down on and standing up from a chair with arms (e.g., wheelchair, bedside commode, etc,.)?: Unable Help needed moving to and from a bed to chair (including a wheelchair)?: A Little Help needed walking in hospital room?: A Lot Help needed climbing 3-5 steps with a railing? : Total 6 Click Score: 11    End of Session Equipment Utilized During Treatment: Gait belt;Oxygen Activity Tolerance: Patient tolerated treatment well Patient left: with call bell/phone within reach;with family/visitor present;in bed Nurse  Communication: Mobility status PT Visit Diagnosis: Unsteadiness on feet (R26.81);Other abnormalities of gait and mobility (R26.89);Muscle weakness (generalized) (M62.81)     Time: 2637-8588 PT Time Calculation (min) (ACUTE ONLY): 15 min  Charges:  $Gait Training: 8-22 mins                     Onetta Spainhower B. Migdalia Dk PT, DPT Acute Rehabilitation  [phone] Pager [phone]     [place] 05/27/2018, 11:04 AM

## 2018-05-27 NOTE — Progress Notes (Signed)
CBG was 486 when rechecked.  Schorr, NP notified; 10 units of novolog ordered and administered.  Will continue to monitor patient.

## 2018-05-28 DIAGNOSIS — Z8669 Personal history of other diseases of the nervous system and sense organs: Secondary | ICD-10-CM | POA: Diagnosis not present

## 2018-05-28 DIAGNOSIS — K7581 Nonalcoholic steatohepatitis (NASH): Secondary | ICD-10-CM | POA: Diagnosis not present

## 2018-05-28 DIAGNOSIS — D62 Acute posthemorrhagic anemia: Principal | ICD-10-CM

## 2018-05-28 DIAGNOSIS — R2689 Other abnormalities of gait and mobility: Secondary | ICD-10-CM | POA: Diagnosis not present

## 2018-05-28 DIAGNOSIS — I639 Cerebral infarction, unspecified: Secondary | ICD-10-CM | POA: Diagnosis not present

## 2018-05-28 DIAGNOSIS — F329 Major depressive disorder, single episode, unspecified: Secondary | ICD-10-CM | POA: Diagnosis not present

## 2018-05-28 DIAGNOSIS — M6281 Muscle weakness (generalized): Secondary | ICD-10-CM | POA: Diagnosis not present

## 2018-05-28 DIAGNOSIS — R7309 Other abnormal glucose: Secondary | ICD-10-CM | POA: Diagnosis not present

## 2018-05-28 DIAGNOSIS — D45 Polycythemia vera: Secondary | ICD-10-CM | POA: Diagnosis not present

## 2018-05-28 DIAGNOSIS — R498 Other voice and resonance disorders: Secondary | ICD-10-CM | POA: Diagnosis not present

## 2018-05-28 DIAGNOSIS — K7460 Unspecified cirrhosis of liver: Secondary | ICD-10-CM | POA: Diagnosis not present

## 2018-05-28 DIAGNOSIS — I519 Heart disease, unspecified: Secondary | ICD-10-CM | POA: Diagnosis not present

## 2018-05-28 DIAGNOSIS — R188 Other ascites: Secondary | ICD-10-CM | POA: Diagnosis not present

## 2018-05-28 DIAGNOSIS — I2510 Atherosclerotic heart disease of native coronary artery without angina pectoris: Secondary | ICD-10-CM | POA: Diagnosis not present

## 2018-05-28 DIAGNOSIS — R41841 Cognitive communication deficit: Secondary | ICD-10-CM | POA: Diagnosis not present

## 2018-05-28 DIAGNOSIS — L249 Irritant contact dermatitis, unspecified cause: Secondary | ICD-10-CM | POA: Diagnosis not present

## 2018-05-28 DIAGNOSIS — Z79899 Other long term (current) drug therapy: Secondary | ICD-10-CM | POA: Diagnosis not present

## 2018-05-28 DIAGNOSIS — E119 Type 2 diabetes mellitus without complications: Secondary | ICD-10-CM | POA: Diagnosis not present

## 2018-05-28 LAB — GLUCOSE, CAPILLARY
GLUCOSE-CAPILLARY: 251 mg/dL — AB (ref 70–99)
GLUCOSE-CAPILLARY: 196 mg/dL — AB (ref 70–99)
GLUCOSE-CAPILLARY: 343 mg/dL — AB (ref 70–99)
Glucose-Capillary: 375 mg/dL — ABNORMAL HIGH (ref 70–99)

## 2018-05-28 MED ORDER — PROMETHAZINE HCL 25 MG PO TABS: 12.5000 mg | ORAL_TABLET | Freq: Four times a day (QID) | ORAL | 1 refills | Status: DC | PRN

## 2018-05-28 MED ORDER — PANCRELIPASE (LIP-PROT-AMYL) 36000-114000 UNITS PO CPEP: 36000.0000 [IU] | ORAL_CAPSULE | Freq: Three times a day (TID) | ORAL | 3 refills | Status: DC

## 2018-05-28 MED ORDER — TRIAMCINOLONE ACETONIDE 0.5 % EX OINT: 1.0000 "application " | TOPICAL_OINTMENT | Freq: Two times a day (BID) | CUTANEOUS | 0 refills | Status: DC

## 2018-05-28 MED ORDER — DIPHENOXYLATE-ATROPINE 2.5-0.025 MG PO TABS: 1.0000 | ORAL_TABLET | Freq: Two times a day (BID) | ORAL | 0 refills | Status: DC | PRN

## 2018-05-28 MED ORDER — FLUCONAZOLE 100 MG PO TABS: 100.0000 mg | ORAL_TABLET | Freq: Every day | ORAL | 0 refills | Status: DC

## 2018-05-28 MED ORDER — PROMETHAZINE HCL 25 MG PO TABS: 12.5000 mg | ORAL_TABLET | Freq: Four times a day (QID) | ORAL | 3 refills | Status: DC | PRN

## 2018-05-28 MED ORDER — SACCHAROMYCES BOULARDII 250 MG PO CAPS: 250.0000 mg | ORAL_CAPSULE | Freq: Two times a day (BID) | ORAL | 0 refills | Status: DC

## 2018-05-28 NOTE — Anesthesia Postprocedure Evaluation (Signed)
Anesthesia Post Note  Patient: Amanda [name]  Procedure(s) Performed: ESOPHAGOGASTRODUODENOSCOPY (EGD) (N/A ) BIOPSY     Patient location during evaluation: PACU Anesthesia Type: General Level of consciousness: awake and alert Pain management: pain level controlled Vital Signs Assessment: post-procedure vital signs reviewed and stable Respiratory status: spontaneous breathing, nonlabored ventilation, respiratory function stable and patient connected to nasal cannula oxygen Cardiovascular status: blood pressure returned to baseline and stable Postop Assessment: no apparent nausea or vomiting Anesthetic complications: no    Last Vitals:  Vitals:   05/28/18 0410 05/28/18 1118  BP: (!) 86/62 (!) 151/51  Pulse: 83 83  Resp: 19   Temp: 37.2 C   SpO2: 98%     Last Pain:  Vitals:   05/28/18 0800  TempSrc:   PainSc: 0-No pain                 Theia Dezeeuw

## 2018-05-28 NOTE — Clinical Social Work Placement (Signed)
Nurse to call report to [phone], Room 704P     CLINICAL SOCIAL WORK PLACEMENT  NOTE  Date:  05/28/2018  Patient Details  Name: MARKETA MIDKIFF MRN: [id number] Date of Birth: [birth year]  Clinical Social Work is seeking post-discharge placement for this patient at the [place] level of care (*CSW will initial, date and re-position this form in  chart as items are completed):  Yes   Patient/family provided with [facility] Work Department's list of facilities offering this level of care within the geographic area requested by the patient (or if unable, by the patient's family).  Yes   Patient/family informed of their freedom to choose among providers that offer the needed level of care, that participate in Medicare, Medicaid or managed care program needed by the patient, have an available bed and are willing to accept the patient.  Yes   Patient/family informed of [facility]'s ownership interest in [facility] and [facility], as well as of the fact that they are under no obligation to receive care at these facilities.  PASRR submitted to EDS on       PASRR number received on       Existing PASRR number confirmed on 05/26/18     FL2 transmitted to all facilities in geographic area requested by pt/family on 05/26/18     FL2 transmitted to all facilities within larger geographic area on       Patient informed that his/her managed care company has contracts with or will negotiate with certain facilities, including the following:        Yes   Patient/family informed of bed offers received.  Patient [facility] bed at [facility]     Physician recommends and patient chooses bed at      Patient to be transferred to [facility] on 05/28/18.  Patient to be transferred to facility by Family car     Patient family notified on 05/28/18 of transfer.  Name of family member notified:  Husband     PHYSICIAN       Additional Comment:     _______________________________________________ Geralynn Ochs, LCSW 05/28/2018, 12:05 PM

## 2018-05-28 NOTE — Progress Notes (Signed)
CSW following for discharge plan. CSW has spoken with patient's husband, Therapist, sports, and Admissions at [place]. Patient has a bed and facility is ready to accept, patient's husband is ready and waiting to start the discharge process. CSW has paged MD about putting in discharge order and updated summary; awaiting orders.  CSW to follow.  Laveda Abbe, [place] Clinical Social Worker [phone]

## 2018-05-28 NOTE — Discharge Summary (Addendum)
Physician Discharge Summary  Amanda [name]:[id number] DOB: June 10, 1941 DOA: 05/23/2018  PCP: [name], NP  Admit date: 05/23/2018 Discharge date: 05/28/2018  Admitted From: Home Disposition:  Nursing Home   Recommendations for Outpatient Follow-up:  1. Follow up with PCP in 1-2 weeks 2. Please obtain BMP/CBC in one week 3. 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 difficile 3 years ago, chronic diarrhea on chronic Imodium, presenting with left lower quadrant abdominal pain, first episode about 2 weeks ago, described as sharp left lower quadrant pain, but these symptoms got worse overnight, for which she sought medical attention. She denies any gross blood in the stools. She was feeling nauseous on presentation. She denies any fever, chills, she had some nausea without vomiting, she denies any chest pain or palpitations, she denies any shortness of breath or cough. She denies any sick contacts or food poisoning. She is compliant with her medications. She denies any dysuria, gross hematuria, vaginal bleeding, lower extremity swelling or calf pain. She does take aspirin a day, and reports being compliant with her medications. Of note, her Hydrea was modified a few weeks ago. She is very anxious, easily irritable, does not want to provide any further information, but no acute mental status changes are noted. She denies any tobacco, alcohol or recreational drug use. Denies any pruritic rash.  Discharge Diagnoses:  Principal Problem:   Acute blood loss anemia Active Problems:   Hyperlipidemia   Depression   Essential hypertension, benign   DIASTOLIC DYSFUNCTION   PSORIASIS   Polycythemia  vera (HCC)   Thrombocytosis (HCC)   Coronary artery disease involving native coronary artery   Hepatic cirrhosis (HCC)   OSA (obstructive sleep apnea)   Chronic diarrhea   Iron deficiency anemia   CVA (cerebral vascular accident) ([place])  Anemia:FOBT was positive . Hemoglobin was 6.2 on presentation. Her baseline hemoglobin is around 12.  Hemoglobin on the day of discharge is 9.7. S/P4units of PRBC so far .Patient deniedany bloody stools or frank bleeding. Continue PPI Hold all NSAIDs including preadmission baby aspirinUnderwent EGD today which showed gastritis ,no active bleeding,no esophageal or fundal varices.She refused  colonoscopy. Follow up for hgb check 1-2 weeks.  Patient kept for one more day, as rehab facility could not accept patient till 05/28/18.  Cirrhosis/NASH:She has history of [place] cirrhosis.She underwent ultrasound-guided paracentesis with removal of 2.7 L of fluid.Started on Lasix and spinal lactone. Fluid analysis not suggestivefor SBP.  Acute left lower quadrant pain:No other acute abdominal findings are noted except for diverticulosisas per CT.Abdominal pain has resolved  Chronic diarrhea:Continue with her Imodium, as well as Creon. Currently she does not have diarrhea.  And probiotics.  Lower extremity edema Currently on Lasix,spironolactone.  Wheezing: Chest x-ray done showed mild pulmonary edema. Started on diuretics.  CKD stage 2-3:On reviewing her past BMP,her creatinine fluctuates from 1.2-1.4.Currently kidney function on baseline.  [name]: Follow-up with Dr. [name].   PPJ:KDTOIZTI CPAP nightly  Type II Diabetes with neuropathy:ContinueSSI and restart Trulicity that she was taking at home.  Husband reports that she was taking 0.75 q weekly..Continue Neurontin  Hyperlipidemia:Continue home statins  Hypertension: Lasix Aldactone and beta-blocker.  Anxiety and depression:Continue  Remeron  Oral candidiasis: Continue antifungal Diflucan for 6 more days.   Discharge Instructions  Discharge Instructions  Call MD for:  difficulty breathing, headache or visual disturbances   Complete by:  As directed    Call MD for:  persistant dizziness or light-headedness   Complete by:  As directed    Call MD for:  persistant nausea and vomiting   Complete by:  As directed    Call MD for:  severe uncontrolled pain   Complete by:  As directed    Diet - low sodium heart healthy   Complete by:  As directed    Diet - low sodium heart healthy   Complete by:  As directed    Discharge instructions   Complete by:  As directed    Continue your home medications as prescribed. Follow up with your oncologist in 1- 2 weeks.   Increase activity slowly   Complete by:  As directed    Increase activity slowly   Complete by:  As directed          Medication List    STOP taking these medications   aspirin 325 MG tablet   hydroxyurea 500 MG capsule Commonly known as:  HYDREA     TAKE these medications   ACCU-CHEK FASTCLIX LANCETS Misc Use to check blood sugars 3 times daily   ACCU-CHEK GUIDE w/Device Kit 1 kit by Does not apply route as directed.   acetaminophen 325 MG tablet Commonly known as:  TYLENOL Take 2 tablets (650 mg total) by mouth every 6 (six) hours as needed for mild pain (or Fever >/= 101).   amLODipine 10 MG tablet Commonly known as:  NORVASC Take 1 tablet (10 mg total) by mouth daily.   diphenoxylate-atropine 2.5-0.025 MG tablet Commonly known as:  LOMOTIL Take 1-2 tablets by mouth 2 (two) times daily as needed for diarrhea or loose stools.   fluconazole 100 MG tablet Commonly known as:  DIFLUCAN Take 1 tablet (100 mg total) by mouth daily. Start taking on:  05/28/2018   furosemide 20 MG tablet Commonly known as:  LASIX Take 1 tablet (20 mg total) by mouth daily. Start taking on:  05/28/2018   gabapentin 100 MG capsule Commonly  known as:  NEURONTIN Take 1 capsule (100 mg total) by mouth 2 (two) times daily.   glucose blood test strip Use to check blood sugars 3 times daily   ONETOUCH VERIO test strip Generic drug:  glucose blood USE TO TEST BLOOD SUGAR THREE TIMES DAILY   Insulin Pen Needle 32G X 4 MM Misc USE TO INJECT INSULIN AS DIRECTED.   lipase/protease/amylase 36000 UNITS Cpep capsule Commonly known as:  CREON Take 1 capsule (36,000 Units total) by mouth 3 (three) times daily with meals.   loperamide 2 MG tablet Commonly known as:  IMODIUM A-D Take 1-2 tablets (2-4 mg total) by mouth daily as needed for diarrhea or loose stools.   metoprolol tartrate 25 MG tablet Commonly known as:  LOPRESSOR Take 0.5 tablets (12.5 mg total) by mouth 2 (two) times daily.   mirtazapine 7.5 MG tablet Commonly known as:  REMERON Take 1 tablet (7.5 mg total) by mouth at bedtime.   ondansetron 4 MG disintegrating tablet Commonly known as:  ZOFRAN-ODT Take 1 tablet (4 mg total) by mouth every 8 (eight) hours as needed. What changed:  reasons to take this   promethazine 25 MG tablet Commonly known as:  PHENERGAN Take 0.5 tablets (12.5 mg total) by mouth every 6 (six) hours as needed for nausea or vomiting.   rosuvastatin 20 MG tablet Commonly known as:  CRESTOR TAKE 1 TABLET(20 MG) BY MOUTH DAILY   triamcinolone ointment 0.5 % Commonly known as:  KENALOG Apply 1 application topically 2 (two) times daily.     Contact information for follow-up providers    [name], NP Follow up.   Specialty:  Family Medicine Contact information: [place] [address] [phone]        Volanda Napoleon, MD Follow up.   Specialty:  Oncology Contact information: [address] [place] [address] [phone]            Contact information for after-discharge care    Destination    [facility] .   Service:  Skilled  Nursing Contact information: [place] [address] [phone]                 Allergies  Allergen Reactions  . Penicillins Swelling    Has patient had a PCN reaction causing immediate rash, facial/tongue/throat swelling, SOB or lightheadedness with hypotension: No Has patient had a PCN reaction causing severe rash involving mucus membranes or skin necrosis: No Has patient had a PCN reaction that required hospitalization: No Has patient had a PCN reaction occurring within the last 10 years: No If all of the above answers are "NO", then may proceed with Cephalosporin use.  Marland Kitchen Lisinopril Cough  . Tape Hives  . Doxycycline Hives, Swelling and Rash  . Latex Hives, Itching and Rash    Consultations:  GI   Procedures/Studies: Ct Abdomen Pelvis W Contrast  Result Date: 05/23/2018 CLINICAL DATA:  Left lower quadrant abdominal pain beginning last night and worsening today. EXAM: CT ABDOMEN AND PELVIS WITH CONTRAST TECHNIQUE: Multidetector CT imaging of the abdomen and pelvis was performed using the standard protocol following bolus administration of intravenous contrast. CONTRAST:  174m ISOVUE-300 IOPAMIDOL (ISOVUE-300) INJECTION 61% COMPARISON:  01/04/2018 FINDINGS: The patient is scanned in the right-side-down decubitus position. lower chest: Negative Hepatobiliary: Chronic cirrhosis of the liver. No acute liver finding. No calcified gallstones. Pancreas: No lesion of the pancreas identified. Spleen: Splenomegaly as seen previously. Adrenals/Urinary Tract: Normal adrenal glands. Normal kidneys. Normal bladder. Stomach/Bowel: No acute bowel pathology is seen. Patient has diverticulosis of the sigmoid colon but I do not see evidence of diverticulitis. No bowel obstruction. Vascular/Lymphatic: Aortic atherosclerosis. No aneurysm. IVC is normal. No retroperitoneal adenopathy. Reproductive: No pelvic mass. Other: Increase in amount of ascites, freely distributed and  layering dependently due to the decubitus positioning. Musculoskeletal: Negative IMPRESSION: Patient is scanned in the right-side-down decubitus position. Marked increase in the amount of ascites, freely distributed within the peritoneal space and layering dependently due to the decubitus positioning. 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 Flow is present in the portal system, but I cannot determine direction by CT. Diverticulosis but no evidence of diverticulitis. Electronically Signed   By: MNelson ChimesM.D.   On: 05/23/2018 16:12   Dg Chest Port 1 View  Result Date: 05/25/2018 CLINICAL DATA:  Shortness of breath. EXAM: PORTABLE CHEST 1 VIEW COMPARISON:  Radiographs and CT scan of March 23, 2018. FINDINGS: Stable cardiomegaly with central pulmonary vascular congestion is noted. Mildly increased interstitial densities are noted bilaterally suggesting possible pulmonary edema. No pneumothorax or significant pleural effusion is noted. Bony thorax is unremarkable. IMPRESSION: Stable cardiomegaly with central pulmonary vascular congestion and probable mild bilateral pulmonary edema. Electronically Signed   By: JMarijo Conception M.D.   On: 05/25/2018 12:54  Ir Paracentesis  Result Date: 05/25/2018 INDICATION: History of NASH cirrhosis. Abdominal distention. Ascites. Request for diagnostic and therapeutic paracentesis. EXAM: ULTRASOUND GUIDED LEFT LOWER QUADRANT PARACENTESIS MEDICATIONS: None. COMPLICATIONS: None immediate. PROCEDURE: Informed written consent was obtained from the patient after a discussion of the risks, benefits and alternatives to treatment. A timeout was performed prior to the initiation of the procedure. Initial ultrasound scanning demonstrates a large amount of ascites within the left lower abdominal quadrant. The left lower abdomen was prepped and draped in the usual sterile fashion. 1% lidocaine with epinephrine was used for local anesthesia. Following this, a 19 gauge,  10-cm, Yueh catheter was introduced. An ultrasound image was saved for documentation purposes. The paracentesis was performed. The catheter was removed and a dressing was applied. The patient tolerated the procedure well without immediate post procedural complication. FINDINGS: A total of approximately 2.7 L of slightly hazy, yellow fluid was removed. Samples were sent to the laboratory as requested by the clinical team. IMPRESSION: Successful ultrasound-guided paracentesis yielding 2.7 liters of peritoneal fluid. Read by: Ascencion Dike PA-C Electronically Signed   By: Lucrezia Europe M.D.   On: 05/25/2018 16:17   Upper endoscopy- Showed gastritis, which was biopsied. Results- pending.   Subjective:  Doing well. Ready to go home, Early read of blood pressure systolic of 64B was spurious. Patient tolerating PO, No vomiting, bloody or dark stools in patient.   Discharge Exam: Vitals:   05/28/18 0410 05/28/18 1118  BP: (!) 86/62 (!) 151/51  Pulse: 83 83  Resp: 19   Temp: 99 F (37.2 C)   SpO2: 98%    Vitals:   05/27/18 1412 05/27/18 2118 05/28/18 0410 05/28/18 1118  BP: 132/82 (!) 146/59 (!) 86/62 (!) 151/51  Pulse: 94 92 83 83  Resp: 20 20 19    Temp: 98.1 F (36.7 C) 98.9 F (37.2 C) 99 F (37.2 C)   TempSrc: Oral Oral Oral   SpO2: 97% 96% 98%   Weight:      Height:        General: Pt is alert, awake, not in acute distress Cardiovascular: RRR, S1/S2 +, no rubs, no gallops Respiratory: CTA bilaterally, no wheezing, no rhonchi Abdominal: Soft, NT, ND, bowel sounds + Extremities: no edema, no cyanosis    The results of significant diagnostics from this hospitalization (including imaging, microbiology, ancillary and laboratory) are listed below for reference.     Microbiology: Recent Results (from the past 240 hour(s))  MRSA PCR Screening     Status: Abnormal   Collection Time: 05/23/18 11:51 PM  Result Value Ref Range Status   MRSA by PCR POSITIVE (A) NEGATIVE Final     Comment:        The GeneXpert MRSA Assay (FDA approved for NASAL specimens only), is one component of a comprehensive MRSA colonization surveillance program. It is not intended to diagnose MRSA infection nor to guide or monitor treatment for MRSA infections. RESULT CALLED TO, READ BACK BY AND VERIFIED WITH: Toy Cookey RN 0234 05/24/18 A BROWNING Performed at [facility], [place] [address] [place], [place] [address]   Culture, body fluid-bottle     Status: None (Preliminary result)   Collection Time: 05/25/18  3:58 PM  Result Value Ref Range Status   Specimen Description PERITONEAL  Final   Special Requests NONE  Final   Culture   Final    NO GROWTH 2 DAYS Performed at [facility] [address], [place], [place] [address]  Report Status PENDING  Incomplete  Gram stain     Status: None   Collection Time: 05/25/18  3:58 PM  Result Value Ref Range Status   Specimen Description PERITONEAL  Final   Special Requests NONE  Final   Gram Stain   Final    RARE WBC PRESENT,BOTH PMN AND MONONUCLEAR NO ORGANISMS SEEN Performed at [facility], [address], [place], [place] [address]    Report Status 05/25/2018 FINAL  Final     Labs: BNP (last 3 results) Recent Labs    03/23/18 0221  BNP 341.9*   Basic Metabolic Panel: Recent Labs  Lab 05/23/18 1042 05/24/18 0651 05/25/18 0538 05/26/18 0549 05/27/18 0520  NA 141 140 142 142 140  K 4.2 4.1 4.1 3.4* 3.6  CL 110 109 109 106 106  CO2 22 20* 24 25 25   GLUCOSE 210* 182* 177* 169* 248*  BUN 15 14 14 14  24*  CREATININE 1.16* 1.07* 1.08* 1.23* 1.33*  CALCIUM 8.4* 8.4* 8.5* 8.3* 8.6*   Liver Function Tests: Recent Labs  Lab 05/23/18 1042 05/26/18 0549  AST 23 18  ALT 9 7  ALKPHOS 119 97  BILITOT 1.3* 1.9*  PROT 5.6* 5.2*  ALBUMIN 3.1* 2.9*   Recent Labs  Lab 05/23/18 1042  LIPASE 41   Recent Labs  Lab 05/25/18 0538  AMMONIA 34   CBC: Recent Labs  Lab 05/23/18 1042  05/24/18 0025  05/24/18 0651 05/25/18 0538 05/26/18 0549 05/27/18 0520  WBC 46.8*   < > 44.1* 48.1* 40.3* 29.4* 49.1*  NEUTROABS 42.5*  --   --   --  35.9* 25.8* 45.1*  HGB 6.2*   < > 7.0* 7.1* 6.9* 8.7* 9.7*  HCT 22.1*   < > 23.9* 24.3* 23.7* 29.1* 32.2*  MCV 90.2   < > 90.5 91.0 90.5 89.5 87.7  PLT 703*   < > 635* 679* 562* 441* 542*   < > = values in this interval not displayed.   CBG: Recent Labs  Lab 05/27/18 1659 05/27/18 2114 05/28/18 0005 05/28/18 0409 05/28/18 0829  GLUCAP 500* 486* 375* 251* 196*   Urinalysis    Component Value Date/Time   COLORURINE YELLOW 05/23/2018 1341   APPEARANCEUR HAZY (A) 05/23/2018 1341   LABSPEC 1.013 05/23/2018 1341   PHURINE 5.0 05/23/2018 1341   GLUCOSEU NEGATIVE 05/23/2018 1341   GLUCOSEU NEGATIVE 01/10/2018 1217   HGBUR LARGE (A) 05/23/2018 1341   HGBUR large 05/07/2010 0845   BILIRUBINUR NEGATIVE 05/23/2018 1341   BILIRUBINUR neg 01/26/2016 1003   KETONESUR NEGATIVE 05/23/2018 1341   PROTEINUR 100 (A) 05/23/2018 1341   UROBILINOGEN 0.2 01/10/2018 1217   NITRITE NEGATIVE 05/23/2018 1341   LEUKOCYTESUR NEGATIVE 05/23/2018 1341   Sepsis Labs Invalid input(s): PROCALCITONIN,  WBC,  LACTICIDVEN Microbiology Recent Results (from the past 240 hour(s))  MRSA PCR Screening     Status: Abnormal   Collection Time: 05/23/18 11:51 PM  Result Value Ref Range Status   MRSA by PCR POSITIVE (A) NEGATIVE Final    Comment:        The GeneXpert MRSA Assay (FDA approved for NASAL specimens only), is one component of a comprehensive MRSA colonization surveillance program. It is not intended to diagnose MRSA infection nor to guide or monitor treatment for MRSA infections. RESULT CALLED TO, READ BACK BY AND VERIFIED WITH: Toy Cookey RN 0234 05/24/18 A BROWNING Performed at [facility], [place] [address] [place], [place] [address]   Culture,  body fluid-bottle     Status: None (Preliminary result)   Collection Time: 05/25/18  3:58 PM  Result Value  Ref Range Status   Specimen Description PERITONEAL  Final   Special Requests NONE  Final   Culture   Final    NO GROWTH 2 DAYS Performed at [facility], [address] [place], [place] [address]    Report Status PENDING  Incomplete  Gram stain     Status: None   Collection Time: 05/25/18  3:58 PM  Result Value Ref Range Status   Specimen Description PERITONEAL  Final   Special Requests NONE  Final   Gram Stain   Final    RARE WBC PRESENT,BOTH PMN AND MONONUCLEAR NO ORGANISMS SEEN Performed at [facility], [address] [place], [place] [address]    Report Status 05/25/2018 FINAL  Final     Time coordinating discharge: Over 30 minutes  SIGNED:   Bethena Roys, MD  [facility] 05/28/2018, 11:45 AM   If 7PM-7AM, please contact night-coverage [email or web address] Password [email or web address]

## 2018-05-28 NOTE — Progress Notes (Signed)
Discharge instructions given to husband and Mrs. Caryl Comes. PIV removed. Pt VSS.  CMT notified of d/c.

## 2018-05-29 ENCOUNTER — Telehealth: Payer: Self-pay | Admitting: *Deleted

## 2018-05-29 ENCOUNTER — Other Ambulatory Visit: Payer: Self-pay | Admitting: Hematology & Oncology

## 2018-05-29 ENCOUNTER — Telehealth: Payer: Self-pay | Admitting: Adult Health

## 2018-05-29 DIAGNOSIS — D75839 Thrombocytosis, unspecified: Secondary | ICD-10-CM

## 2018-05-29 DIAGNOSIS — D45 Polycythemia vera: Secondary | ICD-10-CM

## 2018-05-29 DIAGNOSIS — D473 Essential (hemorrhagic) thrombocythemia: Secondary | ICD-10-CM

## 2018-05-29 NOTE — Anesthesia Postprocedure Evaluation (Signed)
Anesthesia Post Note  Patient: Amanda [name]  Procedure(s) Performed: ESOPHAGOGASTRODUODENOSCOPY (EGD) (N/A ) BIOPSY     Patient location during evaluation: PACU Anesthesia Type: MAC Level of consciousness: awake and alert Pain management: pain level controlled Vital Signs Assessment: post-procedure vital signs reviewed and stable Respiratory status: spontaneous breathing, nonlabored ventilation, respiratory function stable and patient connected to nasal cannula oxygen Cardiovascular status: stable and blood pressure returned to baseline Postop Assessment: no apparent nausea or vomiting Anesthetic complications: no    Last Vitals:  Vitals:   05/28/18 0410 05/28/18 1118  BP: (!) 86/62 (!) 151/51  Pulse: 83 83  Resp: 19   Temp: 37.2 C   SpO2: 98%     Last Pain:  Vitals:   05/28/18 0800  TempSrc:   PainSc: 0-No pain                 Liberty Stead

## 2018-05-29 NOTE — Telephone Encounter (Signed)
Copied from [place] [id number]. Topic: Quick Communication - See Telephone Encounter >> May 29, 2018 10:38 AM Blase Mess A wrote: CRM for notification. See Telephone encounter for: 05/29/18. Patient's Husband Paulena Servais called. Amanda [name] was in the hospital and now is in rehab Patient husband would like Tommi Rumps to review Patients medical history and give him a call back on what was previously wrong with her.

## 2018-05-29 NOTE — Telephone Encounter (Signed)
Called patient for TCM and spoke w/ her husband. She was discharged to [facility] for rehab, so is not a TCM candidate at this time. [name] is requesting Tommi Rumps review her hospitalization and discharge instructions and contact him to discuss what may have caused her symptoms.

## 2018-05-29 NOTE — Addendum Note (Signed)
Addendum  created 05/29/18 1223 by [name], MD   Sign clinical note

## 2018-05-30 LAB — CULTURE, BODY FLUID W GRAM STAIN -BOTTLE: Culture: NO GROWTH

## 2018-05-31 ENCOUNTER — Encounter: Payer: Self-pay | Admitting: Gastroenterology

## 2018-06-02 ENCOUNTER — Encounter: Payer: Self-pay | Admitting: Hematology & Oncology

## 2018-06-02 ENCOUNTER — Inpatient Hospital Stay: Payer: PPO | Attending: Family | Admitting: Hematology & Oncology

## 2018-06-02 ENCOUNTER — Other Ambulatory Visit: Payer: Self-pay

## 2018-06-02 ENCOUNTER — Inpatient Hospital Stay: Payer: PPO

## 2018-06-02 VITALS — BP 129/34 | HR 74 | Temp 98.6°F | Resp 22 | Wt 214.0 lb

## 2018-06-02 DIAGNOSIS — Z79899 Other long term (current) drug therapy: Secondary | ICD-10-CM | POA: Diagnosis not present

## 2018-06-02 DIAGNOSIS — E119 Type 2 diabetes mellitus without complications: Secondary | ICD-10-CM | POA: Diagnosis not present

## 2018-06-02 DIAGNOSIS — R7309 Other abnormal glucose: Secondary | ICD-10-CM | POA: Insufficient documentation

## 2018-06-02 DIAGNOSIS — D45 Polycythemia vera: Secondary | ICD-10-CM | POA: Diagnosis not present

## 2018-06-02 DIAGNOSIS — K7460 Unspecified cirrhosis of liver: Secondary | ICD-10-CM | POA: Diagnosis not present

## 2018-06-02 DIAGNOSIS — D75839 Thrombocytosis, unspecified: Secondary | ICD-10-CM

## 2018-06-02 DIAGNOSIS — R188 Other ascites: Secondary | ICD-10-CM | POA: Insufficient documentation

## 2018-06-02 DIAGNOSIS — D473 Essential (hemorrhagic) thrombocythemia: Secondary | ICD-10-CM

## 2018-06-02 DIAGNOSIS — K7581 Nonalcoholic steatohepatitis (NASH): Secondary | ICD-10-CM

## 2018-06-02 NOTE — Progress Notes (Signed)
Hematology and Oncology Follow Up Visit  Amanda [name] [id number] June 18, 1941 77 y.o.  06/02/2018   Principle Diagnosis:  [name] - JAK2 (+)  Current Therapy:   Observation   Interim History:  Amanda [name] is here today with her husband for follow-up.  She really has had some issues since we last saw her.  She was recently discharged from the hospital.  She had marked anemia.  There was not any obvious bleeding.  She had a lot of ascites.  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  She had a lot of fluid removed from her belly.  She is in a rehab center now.  She is not happy at all about this.  She is not happy at home about her current status.  She just really hates not be able to do what she would like to do.  We had tried her on Hydrea.  Apparently she had bad diarrhea with Hydrea.  Para she is on nothing for her polycythemia.  We really need to get her on something for polycythemia.  I will have to restart her Jakafi (25 mg p.o. twice daily).   To no surprise, her blood sugars have not been all that well controlled.  We cannot get blood on her today.  Her husband brought in blood work that was done on her about a week or so ago.  I just feel bad for her.  I really wish we could try to improve her quality of life.  Currently, her performance status is ECOG 2 at best.      Medications:  Allergies as of 06/02/2018      Reactions   Penicillins Swelling   Has patient had a PCN reaction causing immediate rash, facial/tongue/throat swelling, SOB or lightheadedness with hypotension: No Has patient had a PCN reaction causing severe rash involving mucus membranes or skin necrosis: No Has patient had a PCN reaction that required hospitalization: No Has patient had a PCN reaction occurring within the last 10 years: No If all of the above answers are "NO", then may proceed with Cephalosporin use.   Lisinopril Cough   Tape Hives   Doxycycline Hives, Swelling, Rash   Latex  Hives, Itching, Rash      Medication List        Accurate as of 06/02/18  4:53 PM. Always use your most recent med list.          ACCU-CHEK GUIDE w/Device Kit 1 kit by Does not apply route as directed.   acetaminophen 325 MG tablet Commonly known as:  TYLENOL Take 2 tablets (650 mg total) by mouth every 6 (six) hours as needed for mild pain (or Fever >/= 101).   amLODipine 10 MG tablet Commonly known as:  NORVASC Take 1 tablet (10 mg total) by mouth daily.   diphenoxylate-atropine 2.5-0.025 MG tablet Commonly known as:  LOMOTIL Take 1-2 tablets by mouth 2 (two) times daily as needed for diarrhea or loose stools.   Dulaglutide 0.75 MG/0.5ML Sopn Inject 0.75 mg into the skin once a week.   fluconazole 100 MG tablet Commonly known as:  DIFLUCAN Take 1 tablet (100 mg total) by mouth daily.   furosemide 20 MG tablet Commonly known as:  LASIX Take 1 tablet (20 mg total) by mouth daily.   gabapentin 100 MG capsule Commonly known as:  NEURONTIN Take 1 capsule (100 mg total) by mouth 2 (two) times daily.   glucose blood test strip Use to check blood  sugars 3 times daily   ONETOUCH VERIO test strip Generic drug:  glucose blood USE TO TEST BLOOD SUGAR THREE TIMES DAILY   insulin aspart 100 UNIT/ML injection Commonly known as:  novoLOG Inject 0-9 Units into the skin 3 (three) times daily with meals.   Insulin Pen Needle 32G X 4 MM Misc USE TO INJECT INSULIN AS DIRECTED.   JAKAFI 25 MG tablet Generic drug:  ruxolitinib phosphate Take 25 mg by mouth 2 (two) times daily.   lipase/protease/amylase 36000 UNITS Cpep capsule Commonly known as:  CREON Take 1 capsule (36,000 Units total) by mouth 3 (three) times daily with meals.   loperamide 2 MG tablet Commonly known as:  IMODIUM A-D Take 1-2 tablets (2-4 mg total) by mouth daily as needed for diarrhea or loose stools.   metoprolol tartrate 25 MG tablet Commonly known as:  LOPRESSOR Take 0.5 tablets (12.5 mg total) by  mouth 2 (two) times daily.   mirtazapine 7.5 MG tablet Commonly known as:  REMERON Take 1 tablet (7.5 mg total) by mouth at bedtime.   ondansetron 4 MG disintegrating tablet Commonly known as:  ZOFRAN-ODT Take 1 tablet (4 mg total) by mouth every 8 (eight) hours as needed.   pantoprazole 40 MG tablet Commonly known as:  PROTONIX Take 1 tablet (40 mg total) by mouth daily.   promethazine 25 MG tablet Commonly known as:  PHENERGAN Take 0.5 tablets (12.5 mg total) by mouth every 6 (six) hours as needed for nausea or vomiting.   rosuvastatin 20 MG tablet Commonly known as:  CRESTOR TAKE 1 TABLET(20 MG) BY MOUTH DAILY   saccharomyces boulardii 250 MG capsule Commonly known as:  FLORASTOR Take 1 capsule (250 mg total) by mouth 2 (two) times daily.   spironolactone 50 MG tablet Commonly known as:  ALDACTONE Take 0.5 tablets (25 mg total) by mouth daily.   triamcinolone ointment 0.5 % Commonly known as:  KENALOG Apply 1 application topically 2 (two) times daily.       Allergies:  Allergies  Allergen Reactions  . Penicillins Swelling    Has patient had a PCN reaction causing immediate rash, facial/tongue/throat swelling, SOB or lightheadedness with hypotension: No Has patient had a PCN reaction causing severe rash involving mucus membranes or skin necrosis: No Has patient had a PCN reaction that required hospitalization: No Has patient had a PCN reaction occurring within the last 10 years: No If all of the above answers are "NO", then may proceed with Cephalosporin use.  Marland Kitchen Lisinopril Cough  . Tape Hives  . Doxycycline Hives, Swelling and Rash  . Latex Hives, Itching and Rash    Past Medical History, Surgical history, Social history, and Family History were reviewed and updated.  Review of Systems: All other 10 point review of systems is negative.   Physical Exam:  weight is 214 lb (97.1 kg). Her oral temperature is 98.6 F (37 C). Her blood pressure is 129/34  (abnormal) and her pulse is 74. Her respiration is 22 (abnormal) and oxygen saturation is 99%.   Wt Readings from Last 3 Encounters:  06/02/18 214 lb (97.1 kg)  05/25/18 217 lb 9.5 oz (98.7 kg)  05/11/18 220 lb (99.8 kg)    Ocular: Sclerae unicteric, pupils equal, round and reactive to light Ear-nose-throat: Oropharynx clear, dentition fair Lymphatic: No cervical, supraclavicular or axillary adenopathy Lungs no rales or rhonchi, good excursion bilaterally Heart regular rate and rhythm, no murmur appreciated Abd soft, nontender, positive bowel sounds, no liver or spleen  tip palpated on exam, no fluid wave  MSK no focal spinal tenderness, no joint edema Neuro: non-focal, well-oriented, appropriate affect Breasts: Deferred   Lab Results  Component Value Date   WBC 49.1 (H) 05/27/2018   HGB 9.7 (L) 05/27/2018   HCT 32.2 (L) 05/27/2018   MCV 87.7 05/27/2018   PLT 542 (H) 05/27/2018   Lab Results  Component Value Date   FERRITIN 203 04/13/2018   IRON 82 04/13/2018   TIBC 304 04/13/2018   UIBC 221 04/13/2018   IRONPCTSAT 27 04/13/2018   Lab Results  Component Value Date   RETICCTPCT 2.4 01/30/2014   RBC 3.67 (L) 05/27/2018   No results found for: Nils Pyle [facility] Lab Results  Component Value Date   IGA 233 03/29/2014   No results found for: Kathrynn Ducking, MSPIKE, SPEI   Chemistry      Component Value Date/Time   NA 140 05/27/2018 0520   NA 140 10/12/2017 1013   NA 138 01/21/2017 0806   K 3.6 05/27/2018 0520   K 3.7 10/12/2017 1013   K 4.1 01/21/2017 0806   CL 106 05/27/2018 0520   CL 103 10/12/2017 1013   CL 103 02/16/2013 0900   CO2 25 05/27/2018 0520   CO2 25 10/12/2017 1013   CO2 21 (L) 01/21/2017 0806   BUN 24 (H) 05/27/2018 0520   BUN 10 10/12/2017 1013   BUN 12.2 01/21/2017 0806   CREATININE 1.33 (H) 05/27/2018 0520   CREATININE 1.30 (H) 04/13/2018 0856   CREATININE 0.9 10/12/2017 1013    CREATININE 1.0 01/21/2017 0806      Component Value Date/Time   CALCIUM 8.6 (L) 05/27/2018 0520   CALCIUM 8.7 10/12/2017 1013   CALCIUM 9.3 01/21/2017 0806   ALKPHOS 97 05/26/2018 0549   ALKPHOS 97 (H) 10/12/2017 1013   ALKPHOS 112 01/21/2017 0806   AST 18 05/26/2018 0549   AST 51 (H) 04/13/2018 0856   AST 37 (H) 01/21/2017 0806   ALT 7 05/26/2018 0549   ALT 25 04/13/2018 0856   ALT 25 10/12/2017 1013   ALT 16 01/21/2017 0806   BILITOT 1.9 (H) 05/26/2018 0549   BILITOT 0.8 04/13/2018 0856   BILITOT 0.89 01/21/2017 0806      Impression and Plan: Ms. Hast is a very pleasant 77 yo caucasian female with polycythemia vera, JAK-2 positive.   We need to get her back onto [facility].  We really need to get her blood counts under better control.  We need to do a paracentesis.  Looks like she does have fluid on her abdomen from ascites.  I just feel bad for her.  I really think that it is her diabetes that is her biggest problem.  I just am not convinced that the polycythemia is causing a lot of problems.  I would like to see her back in about 3 weeks.    Volanda Napoleon, MD 8/16/20194:53 PM

## 2018-06-05 ENCOUNTER — Ambulatory Visit (HOSPITAL_COMMUNITY): Payer: PPO

## 2018-06-08 ENCOUNTER — Telehealth: Payer: Self-pay

## 2018-06-08 ENCOUNTER — Ambulatory Visit (HOSPITAL_COMMUNITY): Payer: PPO

## 2018-06-08 NOTE — Telephone Encounter (Signed)
Copied from [place] [phone]. Topic: Inquiry >> Jun 08, 2018  2:19 PM Scherrie Gerlach wrote: Reason for CRM: husband calling to ask if you think Misty, pt will be out of her 9:30 am appt tomorrow with Tommi Rumps in time to be at the hospital by 11 am for a procedure? I advised pt I had no way to answer, and he wants to speak with [facility]

## 2018-06-08 NOTE — Telephone Encounter (Signed)
Called and spoke with pts husband and he stated that the pt has to be at [facility] at 11 for a prdcedure.  I advised him that I did not see a reason why they would not be out of here on time, but there is always the possibility that something may come up.  He voiced his understanding and stated that they would be here for her appt .

## 2018-06-08 NOTE — Telephone Encounter (Signed)
Appt is a one month follow up set for 30 mins. Please advise.

## 2018-06-09 ENCOUNTER — Ambulatory Visit (INDEPENDENT_AMBULATORY_CARE_PROVIDER_SITE_OTHER): Payer: PPO | Admitting: Adult Health

## 2018-06-09 ENCOUNTER — Encounter: Payer: Self-pay | Admitting: Adult Health

## 2018-06-09 ENCOUNTER — Telehealth: Payer: Self-pay

## 2018-06-09 ENCOUNTER — Other Ambulatory Visit: Payer: Self-pay | Admitting: Hematology & Oncology

## 2018-06-09 ENCOUNTER — Ambulatory Visit (HOSPITAL_COMMUNITY)
Admission: RE | Admit: 2018-06-09 | Discharge: 2018-06-09 | Disposition: A | Discharge status: Home / Self Care | Payer: PPO | Source: Ambulatory Visit | Attending: Hematology & Oncology | Admitting: Hematology & Oncology

## 2018-06-09 VITALS — BP 130/76 | HR 61 | Temp 97.7°F | Wt 211.5 lb

## 2018-06-09 DIAGNOSIS — F329 Major depressive disorder, single episode, unspecified: Secondary | ICD-10-CM | POA: Diagnosis not present

## 2018-06-09 DIAGNOSIS — K7581 Nonalcoholic steatohepatitis (NASH): Secondary | ICD-10-CM | POA: Insufficient documentation

## 2018-06-09 DIAGNOSIS — R188 Other ascites: Secondary | ICD-10-CM | POA: Insufficient documentation

## 2018-06-09 NOTE — Progress Notes (Addendum)
Subjective:    Patient ID: Amanda [name], female    DOB: Feb 21, 1941, 77 y.o.   MRN: [id number]  HPI  77 year old female who  has a past medical history of Allergic rhinitis (01/23/2016), C. difficile colitis, CAD (coronary artery disease), Cancer ([place]), Candidiasis of skin (09/30/2014), Cirrhosis ([place]), Depression, Diabetes mellitus (2008), Gout, Herpes simplex, Hyperglycemia (05/31/2013), Hyperlipidemia, Hypertension, Myocardial infarction [facility]), Neuromuscular disorder ([place]), Obesity, Polycythemia, and Psoriasis.  Amanda [name] presents to the office today for one-month follow-up regarding depression.  During her last visit she seems severely depressed status post CVA.  She was sleeping a lot and staying in bed throughout the day.  She is no longer enjoying things that she once did.  She was ultimately started on Remeron 7.5 mg.  Since I have last saw her she has been discharged from the hospital with marked anemia and no clear cause of bleeding.  FOBT was positive, hemoglobin was 6.2 on presentation to the emergency room.  Her baseline hemoglobin is around 12.  She was given 4 units of packed red blood cells during the admission.  She underwent an EGD which showed gastritis with no active bleeding and no esophageal or fundal varices were seen.  She refused a colonoscopy  She has a history of NASH cirrhosis.  During this most recent hospital admission she had a paracentesis and had 2.7 L of fluid removed  She has been seen by Dr. [name] who placed her on Jakafi 25 mg twice daily polycythemia vera  Today in the office she reports that she is still at [facility], she is doing PT rehab two hours a day and reports that she has noticed improvement in her strength and overall demeanor. Per patient " I am dressing myself and I went home yesterday and showered myself and did my hair." She reports feeling better. She is unsure if she is receiving Remeron at [facility]. I will have her check this and get back  with me.   She has paracentesis scheduled for 11 am today.   Review of Systems See HPI   Past Medical History:  Diagnosis Date  . Allergic rhinitis 01/23/2016  . C. difficile colitis   . CAD (coronary artery disease)    a. s/p STEMI in 01/2015 with 95% LCx stenosis and distal 80% LCx stenosis (DESx2 placed)  . Cancer ([place])    SKIN  . Candidiasis of skin 09/30/2014  . Cirrhosis ([place])   . Depression   . Diabetes mellitus 2008  . Gout   . Herpes simplex   . Hyperglycemia 05/31/2013  . Hyperlipidemia   . Hypertension   . Myocardial infarction ([place])   . 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 Obesity   . Polycythemia    Dr. Elease Hashimoto- [facility]  . Psoriasis     Social History   Socioeconomic History  . Marital status: Married    Spouse name: Not on file  . Number of children: 2  . Years of education: 2 yr colle  . Highest education level: Not on file  Occupational History  . Occupation: housewife  Social Needs  . Financial resource strain: Not on file  . Food insecurity:    Worry: Not on file    Inability: Not on file  . Transportation needs:    Medical: Not on file    Non-medical: Not on file  Tobacco Use  . Smoking status: Former Smoker    Years: 48.00  Types: Cigarettes    Last attempt to quit: 10/18/1986    Years since quitting: 31.6  . Smokeless tobacco: Never Used  Substance and Sexual Activity  . Alcohol use: No    Alcohol/week: 0.0 standard drinks  . Drug use: No  . Sexual activity: Yes    Partners: Male    Birth control/protection: None  Lifestyle  . Physical activity:    Days per week: Not on file    Minutes per session: Not on file  . Stress: Not on file  Relationships  . Social connections:    Talks on phone: Not on file    Gets together: Not on file    Attends religious service: Not on file    Active member of club or organization: Not on file    Attends meetings of clubs or organizations: Not on file    Relationship status:  Not on file  . Intimate partner violence:    Fear of current or ex partner: Not on file    Emotionally abused: Not on file    Physically abused: Not on file    Forced sexual activity: Not on file  Other Topics Concern  . Not on file  Social History Narrative   2 caffeine drink per day.  No regular exercise.     Retired from the bank   2 children (Daughter and a son) son has morbid obesity   2 grandchildren   3 great grandchildren    Past Surgical History:  Procedure Laterality Date  . BIOPSY  05/26/2018   Procedure: BIOPSY;  Surgeon: [name], MD;  Location: [facility] ENDOSCOPY;  Service: Endoscopy;;  . BREAST SURGERY Left    milk duct  . CATARACT EXTRACTION, BILATERAL    . COLONOSCOPY    . ESOPHAGOGASTRODUODENOSCOPY N/A 05/26/2018   Procedure: ESOPHAGOGASTRODUODENOSCOPY (EGD);  Surgeon: [name], MD;  Location: [facility] ENDOSCOPY;  Service: Endoscopy;  Laterality: N/A;  . IR PARACENTESIS  05/25/2018  . LEFT HEART CATHETERIZATION WITH CORONARY ANGIOGRAM N/A 01/27/2015   Procedure: LEFT HEART CATHETERIZATION WITH CORONARY ANGIOGRAM;  Surgeon: [name], MD;  Location: [facility] CATH LAB;  Service: Cardiovascular;  Laterality: N/A;  . TONSILLECTOMY    . TOTAL ABDOMINAL HYSTERECTOMY W/ BILATERAL SALPINGOOPHORECTOMY     for heavy periods with appendectomy    Family History  Problem Relation Age of Onset  . Diabetes Mother   . Heart disease Mother        CAD  . Hyperlipidemia Mother   . Hypertension Mother   . Kidney disease Mother   . Kidney disease Father   . Breast cancer Maternal Aunt   . Heart disease Maternal Grandmother   . Heart disease Other        maternal aunts and uncles    Allergies  Allergen Reactions  . Penicillins Swelling    Has patient had a PCN reaction causing immediate rash, facial/tongue/throat swelling, SOB or lightheadedness with hypotension: No Has patient had a PCN reaction causing severe rash involving mucus membranes or skin necrosis: No Has patient  had a PCN reaction that required hospitalization: No Has patient had a PCN reaction occurring within the last 10 years: No If all of the above answers are "NO", then may proceed with Cephalosporin use.  Marland Kitchen Lisinopril Cough  . Tape Hives  . Doxycycline Hives, Swelling and Rash  . Latex Hives, Itching and Rash    Current Outpatient Medications on File Prior to Visit  Medication Sig Dispense Refill  . acetaminophen (  TYLENOL) 325 MG tablet Take 2 tablets (650 mg total) by mouth every 6 (six) hours as needed for mild pain (or Fever >/= 101).    Marland Kitchen amLODipine (NORVASC) 10 MG tablet Take 1 tablet (10 mg total) by mouth daily. 90 tablet 1  . Blood Glucose Monitoring Suppl (ACCU-CHEK GUIDE) w/Device KIT 1 kit by Does not apply route as directed. 1 kit 0  . diphenoxylate-atropine (LOMOTIL) 2.5-0.025 MG tablet Take 1-2 tablets by mouth 2 (two) times daily as needed for diarrhea or loose stools. 60 tablet 0  . Dulaglutide (TRULICITY) 8.59 YT/2.4MQ SOPN Inject 0.75 mg into the skin once a week. 0.5 mL 1  . fluconazole (DIFLUCAN) 100 MG tablet Take 1 tablet (100 mg total) by mouth daily. 5 tablet 0  . furosemide (LASIX) 20 MG tablet Take 1 tablet (20 mg total) by mouth daily. 30 tablet 0  . gabapentin (NEURONTIN) 100 MG capsule Take 1 capsule (100 mg total) by mouth 2 (two) times daily. 60 capsule 0  . glucose blood (ACCU-CHEK GUIDE) test strip Use to check blood sugars 3 times daily 100 each 12  . insulin aspart (NOVOLOG) 100 UNIT/ML injection Inject 0-9 Units into the skin 3 (three) times daily with meals. 10 mL 11  . Insulin Pen Needle (BD PEN NEEDLE NANO U/F) 32G X 4 MM MISC USE TO INJECT INSULIN AS DIRECTED. 100 each 0  . lipase/protease/amylase (CREON) 36000 UNITS CPEP capsule Take 1 capsule (36,000 Units total) by mouth 3 (three) times daily with meals. 180 capsule 3  . loperamide (IMODIUM A-D) 2 MG tablet Take 1-2 tablets (2-4 mg total) by mouth daily as needed for diarrhea or loose stools. 30 tablet  1  . metoprolol tartrate (LOPRESSOR) 25 MG tablet Take 0.5 tablets (12.5 mg total) by mouth 2 (two) times daily. 60 tablet 0  . mirtazapine (REMERON) 7.5 MG tablet Take 1 tablet (7.5 mg total) by mouth at bedtime. 30 tablet 1  . ondansetron (ZOFRAN ODT) 4 MG disintegrating tablet Take 1 tablet (4 mg total) by mouth every 8 (eight) hours as needed. (Patient taking differently: Take 4 mg by mouth every 8 (eight) hours as needed for nausea or vomiting. ) 10 tablet 0  . ONETOUCH VERIO test strip USE TO TEST BLOOD SUGAR THREE TIMES DAILY 300 each 2  . pantoprazole (PROTONIX) 40 MG tablet Take 1 tablet (40 mg total) by mouth daily. 30 tablet 1  . promethazine (PHENERGAN) 25 MG tablet Take 0.5 tablets (12.5 mg total) by mouth every 6 (six) hours as needed for nausea or vomiting. 30 tablet 1  . rosuvastatin (CRESTOR) 20 MG tablet TAKE 1 TABLET(20 MG) BY MOUTH DAILY 90 tablet 3  . ruxolitinib phosphate (JAKAFI) 25 MG tablet Take 25 mg by mouth 2 (two) times daily.    Marland Kitchen saccharomyces boulardii (FLORASTOR) 250 MG capsule Take 1 capsule (250 mg total) by mouth 2 (two) times daily. 30 capsule 0  . spironolactone (ALDACTONE) 50 MG tablet Take 0.5 tablets (25 mg total) by mouth daily. 30 tablet 0  . triamcinolone ointment (KENALOG) 0.5 % Apply 1 application topically 2 (two) times daily. 30 g 0   No current facility-administered medications on file prior to visit.     BP 130/76 (BP Location: Left Arm, Patient Position: Sitting, Cuff Size: Normal)   Pulse 61   Temp 97.7 F (36.5 C) (Oral)   Wt 211 lb 8 oz (95.9 kg)   SpO2 97%   BMI 36.30 kg/m  Objective:   Physical Exam  Constitutional: She is oriented to person, place, and time. She appears well-developed and well-nourished. No distress.  Cardiovascular: Normal rate, regular rhythm, normal heart sounds and intact distal pulses.  Pulmonary/Chest: Effort normal and breath sounds normal.  Abdominal: Soft. Bowel sounds are normal. She exhibits  distension and ascites. She exhibits no fluid wave and no mass.  Musculoskeletal: Normal range of motion. She exhibits edema (bilateral lower extremity pitting edema ). She exhibits no tenderness or deformity.  Slow steady gait with rolling walker. Is able to get out of exam chair under own strength   Neurological: She is alert and oriented to person, place, and time.  Skin: Skin is warm and dry. No rash noted. She is not diaphoretic. No erythema. No pallor.  Psychiatric: She has a normal mood and affect. Her behavior is normal. Judgment and thought content normal.  Nursing note reviewed.     Assessment & Plan:  Unsure if she is taking Remeron or not. She will check with facility. She is much happier today then during her last visit and it looks like she is improving. She is looking forward to the holidays coming up. I worry that once she is out of rehab she will decline at home. This was expressed today during the visit. She is worried about this as well. Encouraged to continue to due exercises outside of PT. She can due exercises at bedside and roam the halls at [facility]. Will wait to find out if she is taking Remeron.   Follow up in a month or so or sooner if needed  [name], NP

## 2018-06-09 NOTE — Progress Notes (Signed)
Patient presented to IR today for therapeutic paracentesis due to ascites secondary to cirrhosis. Previous paracentesis on 8/8 yielding 2.7 L of hazy yellow fluid.  Limited abdominal ultrasound performed today which revealed several very small pockets of fluid, however overall fluid does not appear amenable to therapeutic paracentesis today despite abdominal distention. Discussed with patient risks vs benefits of proceeding with paracentesis today - patient stated she would not like to have the procedure today if there is not a lot of fluid, she would prefer to come back at a later date. All questions answered.   Patient to follow up with Dr. [name].   Candiss Norse, PA-C 06/09/2018 1157

## 2018-06-09 NOTE — Telephone Encounter (Signed)
Medication: mirtazapine (REMERON) 7.5 MG tablet [[id number]]   [name] called to let pcp know that the pt is still taking the above medication

## 2018-06-12 NOTE — Telephone Encounter (Signed)
FYI for Cory---pt is still taking the remeron per her husband.  Nothing further is needed.

## 2018-06-13 ENCOUNTER — Other Ambulatory Visit: Payer: PPO

## 2018-06-13 ENCOUNTER — Ambulatory Visit: Payer: PPO | Admitting: Family

## 2018-06-15 ENCOUNTER — Telehealth: Payer: Self-pay | Admitting: Adult Health

## 2018-06-15 NOTE — Telephone Encounter (Signed)
[place] for verbal orders. Yes we will approve PT

## 2018-06-15 NOTE — Telephone Encounter (Signed)
Spoke to [place] and advised that she proceed with orders.  No further action required.

## 2018-06-15 NOTE — Telephone Encounter (Signed)
Copied from [place] [phone]. Topic: Quick Communication - See Telephone Encounter >> Jun 15, 2018 10:36 AM Sheran Luz wrote: CRM for notification. See Telephone encounter for: 06/15/18.   Cindy from [facility] called to get conformation that pts PCP and Dr Elease Hashimoto are willing to approve and sign orders for physical therapy. Jenny Reichmann is also requesting nursing orders for disease management, pt and ot for functional decline. Jenny Reichmann states that pt was discharged from [facility] yesterday. Please advise.   [phone]

## 2018-06-18 DIAGNOSIS — E11319 Type 2 diabetes mellitus with unspecified diabetic retinopathy without macular edema: Secondary | ICD-10-CM | POA: Diagnosis not present

## 2018-06-18 DIAGNOSIS — G510 Bell's palsy: Secondary | ICD-10-CM | POA: Diagnosis not present

## 2018-06-18 DIAGNOSIS — K589 Irritable bowel syndrome without diarrhea: Secondary | ICD-10-CM | POA: Diagnosis not present

## 2018-06-18 DIAGNOSIS — I2510 Atherosclerotic heart disease of native coronary artery without angina pectoris: Secondary | ICD-10-CM | POA: Diagnosis not present

## 2018-06-18 DIAGNOSIS — M8580 Other specified disorders of bone density and structure, unspecified site: Secondary | ICD-10-CM | POA: Diagnosis not present

## 2018-06-18 DIAGNOSIS — E1142 Type 2 diabetes mellitus with diabetic polyneuropathy: Secondary | ICD-10-CM | POA: Diagnosis not present

## 2018-06-18 DIAGNOSIS — K7460 Unspecified cirrhosis of liver: Secondary | ICD-10-CM | POA: Diagnosis not present

## 2018-06-18 DIAGNOSIS — G4733 Obstructive sleep apnea (adult) (pediatric): Secondary | ICD-10-CM | POA: Diagnosis not present

## 2018-06-18 DIAGNOSIS — Z794 Long term (current) use of insulin: Secondary | ICD-10-CM | POA: Diagnosis not present

## 2018-06-18 DIAGNOSIS — E669 Obesity, unspecified: Secondary | ICD-10-CM | POA: Diagnosis not present

## 2018-06-18 DIAGNOSIS — N183 Chronic kidney disease, stage 3 (moderate): Secondary | ICD-10-CM | POA: Diagnosis not present

## 2018-06-18 DIAGNOSIS — E1122 Type 2 diabetes mellitus with diabetic chronic kidney disease: Secondary | ICD-10-CM | POA: Diagnosis not present

## 2018-06-18 DIAGNOSIS — D45 Polycythemia vera: Secondary | ICD-10-CM | POA: Diagnosis not present

## 2018-06-18 DIAGNOSIS — F411 Generalized anxiety disorder: Secondary | ICD-10-CM | POA: Diagnosis not present

## 2018-06-18 DIAGNOSIS — Z6839 Body mass index (BMI) 39.0-39.9, adult: Secondary | ICD-10-CM | POA: Diagnosis not present

## 2018-06-18 DIAGNOSIS — I130 Hypertensive heart and chronic kidney disease with heart failure and stage 1 through stage 4 chronic kidney disease, or unspecified chronic kidney disease: Secondary | ICD-10-CM | POA: Diagnosis not present

## 2018-06-18 DIAGNOSIS — E785 Hyperlipidemia, unspecified: Secondary | ICD-10-CM | POA: Diagnosis not present

## 2018-06-18 DIAGNOSIS — D509 Iron deficiency anemia, unspecified: Secondary | ICD-10-CM | POA: Diagnosis not present

## 2018-06-18 DIAGNOSIS — L409 Psoriasis, unspecified: Secondary | ICD-10-CM | POA: Diagnosis not present

## 2018-06-18 DIAGNOSIS — J309 Allergic rhinitis, unspecified: Secondary | ICD-10-CM | POA: Diagnosis not present

## 2018-06-18 DIAGNOSIS — I5031 Acute diastolic (congestive) heart failure: Secondary | ICD-10-CM | POA: Diagnosis not present

## 2018-06-18 DIAGNOSIS — I252 Old myocardial infarction: Secondary | ICD-10-CM | POA: Diagnosis not present

## 2018-06-18 DIAGNOSIS — B370 Candidal stomatitis: Secondary | ICD-10-CM | POA: Diagnosis not present

## 2018-06-18 DIAGNOSIS — F329 Major depressive disorder, single episode, unspecified: Secondary | ICD-10-CM | POA: Diagnosis not present

## 2018-06-20 ENCOUNTER — Telehealth: Payer: Self-pay | Admitting: Adult Health

## 2018-06-20 ENCOUNTER — Telehealth: Payer: Self-pay | Admitting: Family Medicine

## 2018-06-20 NOTE — Telephone Encounter (Signed)
Copied from [place] [id number]. Topic: General - Other >> Jun 20, 2018 11:09 AM Adelene Idler wrote: Estill Bamberg from [facility] is calling in for verbal orders for Nursing and [address] orders for twice a week for 3 weeks and once a week for five for Nursing and the [place] once a week for 8 weeks   Cb# [phone]

## 2018-06-20 NOTE — Telephone Encounter (Signed)
Ok for verbal orders ?

## 2018-06-20 NOTE — Telephone Encounter (Signed)
Copied from [place] [phone]. Topic: Inquiry >> Jun 20, 2018  3:14 PM Scherrie Gerlach wrote: Reason for CRM: jim with [facility] calling for verbal OT orders 2 wk 4  1 wk 1

## 2018-06-20 NOTE — Telephone Encounter (Signed)
Spoke to [place] and informed her to proceed with orders per [facility].  No further action required.

## 2018-06-21 DIAGNOSIS — Z961 Presence of intraocular lens: Secondary | ICD-10-CM | POA: Diagnosis not present

## 2018-06-21 DIAGNOSIS — E113393 Type 2 diabetes mellitus with moderate nonproliferative diabetic retinopathy without macular edema, bilateral: Secondary | ICD-10-CM | POA: Diagnosis not present

## 2018-06-21 DIAGNOSIS — H401133 Primary open-angle glaucoma, bilateral, severe stage: Secondary | ICD-10-CM | POA: Diagnosis not present

## 2018-06-21 DIAGNOSIS — H43813 Vitreous degeneration, bilateral: Secondary | ICD-10-CM | POA: Diagnosis not present

## 2018-06-21 NOTE — Telephone Encounter (Signed)
Spoke to [place] and advised him to proceed with OT.  No further action required.

## 2018-06-22 ENCOUNTER — Other Ambulatory Visit: Payer: Self-pay | Admitting: *Deleted

## 2018-06-22 NOTE — Patient Outreach (Signed)
[facility]) Care Management  06/22/2018  Amanda [name] July 14, 1941 [id number]   06/22/18   Transition of Care Referral   Referral Date: 06/15/18 Referral Source:  HTA IP discharge Date of Admission: 05/28/18 Diagnosis: acute blood loss, anemia, acute left lowe quadrant pain  Date of Discharge: 06/14/18 Facility:  [facility] Insurance:  HTA   Outreach attempt #1 successful at the home number Patient is able to verify HIPAA Reviewed and addressed Transitional of care referral with patient  Social: Mrs Styles has support of her husband, [name]. She reports needing assist with ADLs and iADLs plus transportation from her husband She confirms she is seen by home health services   Conditions: CAD, Insulin dependent DM, s/p angioplasty with coronary stent, DM retinopathy of both eyes, cirrhosis of liver, former smoker, GAD, STEMI, polycythemia vera, bronchitis, HTN, hx of gallbladder calculus, hyperlipidemia, remote history of C. 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 Patient gave verbal consent for services. Advised patient that other post discharge calls may occur to assess how the patient is doing following the recent hospitalization. Patient voiced understanding and was appreciative of f/u call.  Plan: [facility] RN CM will close case at this time as patient has been assessed and no needs identified [facility] RN CM sent a successful outreach letter as discussed with [facility] brochure enclosed for review  [place]. Lavina Hamman, RN, BSN, [place] Management Care Coordinator Direct Number [phone] Mobile number [phone]  Main [facility] number [phone] Fax number [phone]

## 2018-06-23 ENCOUNTER — Other Ambulatory Visit: Payer: Self-pay

## 2018-06-23 ENCOUNTER — Inpatient Hospital Stay: Payer: PPO | Attending: Family | Admitting: Hematology & Oncology

## 2018-06-23 ENCOUNTER — Inpatient Hospital Stay: Payer: PPO

## 2018-06-23 ENCOUNTER — Telehealth: Payer: Self-pay | Admitting: Adult Health

## 2018-06-23 VITALS — BP 147/51 | HR 80 | Temp 98.1°F | Resp 20 | Wt 222.0 lb

## 2018-06-23 DIAGNOSIS — Z8673 Personal history of transient ischemic attack (TIA), and cerebral infarction without residual deficits: Secondary | ICD-10-CM | POA: Insufficient documentation

## 2018-06-23 DIAGNOSIS — K7581 Nonalcoholic steatohepatitis (NASH): Secondary | ICD-10-CM

## 2018-06-23 DIAGNOSIS — R609 Edema, unspecified: Secondary | ICD-10-CM | POA: Insufficient documentation

## 2018-06-23 DIAGNOSIS — D45 Polycythemia vera: Secondary | ICD-10-CM | POA: Insufficient documentation

## 2018-06-23 DIAGNOSIS — Z79899 Other long term (current) drug therapy: Secondary | ICD-10-CM | POA: Diagnosis not present

## 2018-06-23 DIAGNOSIS — Z794 Long term (current) use of insulin: Secondary | ICD-10-CM | POA: Insufficient documentation

## 2018-06-23 LAB — CMP (CANCER CENTER ONLY)
ALBUMIN: 3.4 g/dL — AB (ref 3.5–5.0)
ALK PHOS: 217 U/L — AB (ref 26–84)
ALT: 28 U/L (ref 10–47)
AST: 62 U/L — AB (ref 11–38)
Anion gap: 2 — ABNORMAL LOW (ref 5–15)
BILIRUBIN TOTAL: 0.9 mg/dL (ref 0.2–1.6)
BUN: 17 mg/dL (ref 7–22)
CALCIUM: 9 mg/dL (ref 8.0–10.3)
CHLORIDE: 114 mmol/L — AB (ref 98–108)
CO2: 22 mmol/L (ref 18–33)
CREATININE: 1.3 mg/dL — AB (ref 0.60–1.20)
Glucose, Bld: 115 mg/dL (ref 73–118)
Potassium: 3.3 mmol/L (ref 3.3–4.7)
SODIUM: 138 mmol/L (ref 128–145)
Total Protein: 6.3 g/dL — ABNORMAL LOW (ref 6.4–8.1)

## 2018-06-23 LAB — CBC WITH DIFFERENTIAL (CANCER CENTER ONLY)
BASOS ABS: 0.2 10*3/uL — AB (ref 0.0–0.1)
BASOS PCT: 2 %
Eosinophils Absolute: 0.5 10*3/uL (ref 0.0–0.5)
Eosinophils Relative: 4 %
HEMATOCRIT: 29.6 % — AB (ref 34.8–46.6)
Hemoglobin: 9 g/dL — ABNORMAL LOW (ref 11.6–15.9)
LYMPHS ABS: 2.1 10*3/uL (ref 0.9–3.3)
LYMPHS PCT: 18 %
MCH: 24.8 pg — ABNORMAL LOW (ref 26.0–34.0)
MCHC: 30.4 g/dL — AB (ref 32.0–36.0)
MCV: 81.5 fL (ref 81.0–101.0)
Monocytes Absolute: 0.8 10*3/uL (ref 0.1–0.9)
Monocytes Relative: 7 %
NEUTROS ABS: 8 10*3/uL — AB (ref 1.5–6.5)
NEUTROS PCT: 69 %
Platelet Count: 193 10*3/uL (ref 145–400)
RBC: 3.63 MIL/uL — ABNORMAL LOW (ref 3.70–5.32)
RDW: 21.4 % — ABNORMAL HIGH (ref 11.1–15.7)
WBC: 11.6 10*3/uL — AB (ref 3.9–10.0)

## 2018-06-23 LAB — LACTATE DEHYDROGENASE: LDH: 301 U/L — ABNORMAL HIGH (ref 98–192)

## 2018-06-23 LAB — SAVE SMEAR

## 2018-06-23 NOTE — Progress Notes (Signed)
Hematology and Oncology Follow Up Visit  Amanda [name] [id number] May 23, 1941 77 y.o.  06/23/2018   Principle Diagnosis:  [name] - JAK2 (+)  Current Therapy:   Jakafi 25 mg po BID   Interim History:  Amanda [name] is here today with her husband for follow-up.  Amanda [name] actually looks a whole lot better than when I last saw her.  We got her back on Jakafi.  Her blood counts have improved nicely.  Her platelet count went from 542,000 down to 193,000.  Her white cell count went from 43,000 down to 9000.Marland Kitchen  Amanda [name] still has a lot of fluid on her.  I thought Amanda [name] had some ascites.  We took her to ultrasound but ultrasound did not show much in the way of ascites.  Amanda [name] is not on Lasix.  Amanda [name] has to be on Lasix.  I told her to take 40 mg Lasix daily for 5 days and then take 20 mg a day.  Hopefully, her family doctor will try to manage this.  Her blood sugars are also doing much better.  Amanda [name] has had no fever.  Amanda [name] is had no diarrhea.  Amanda [name] is had no bleeding.  Overall, I would have to say that her performance status is ECOG 1-2.       Medications:  Allergies as of 06/23/2018      Reactions   Penicillins Swelling   Has patient had a PCN reaction causing immediate rash, facial/tongue/throat swelling, SOB or lightheadedness with hypotension: No Has patient had a PCN reaction causing severe rash involving mucus membranes or skin necrosis: No Has patient had a PCN reaction that required hospitalization: No Has patient had a PCN reaction occurring within the last 10 years: No If all of the above answers are "NO", then may proceed with Cephalosporin use.   Lisinopril Cough   Tape Hives   Doxycycline Hives, Swelling, Rash   Latex Hives, Itching, Rash      Medication List        Accurate as of 06/23/18  2:33 PM. Always use your most recent med list.          ACCU-CHEK GUIDE w/Device Kit 1 kit by Does not apply route as directed.   acetaminophen 325 MG tablet Commonly known as:  TYLENOL Take  2 tablets (650 mg total) by mouth every 6 (six) hours as needed for mild pain (or Fever >/= 101).   amLODipine 10 MG tablet Commonly known as:  NORVASC Take 1 tablet (10 mg total) by mouth daily.   diphenoxylate-atropine 2.5-0.025 MG tablet Commonly known as:  LOMOTIL Take 1-2 tablets by mouth 2 (two) times daily as needed for diarrhea or loose stools.   Dulaglutide 0.75 MG/0.5ML Sopn Inject 0.75 mg into the skin once a week.   fluconazole 100 MG tablet Commonly known as:  DIFLUCAN Take 1 tablet (100 mg total) by mouth daily.   furosemide 20 MG tablet Commonly known as:  LASIX Take 1 tablet (20 mg total) by mouth daily.   gabapentin 100 MG capsule Commonly known as:  NEURONTIN Take 1 capsule (100 mg total) by mouth 2 (two) times daily.   glucose blood test strip Use to check blood sugars 3 times daily   ONETOUCH VERIO test strip Generic drug:  glucose blood USE TO TEST BLOOD SUGAR THREE TIMES DAILY   insulin aspart 100 UNIT/ML injection Commonly known as:  novoLOG Inject 0-9 Units into the skin 3 (three) times daily with meals.   Insulin  Pen Needle 32G X 4 MM Misc USE TO INJECT INSULIN AS DIRECTED.   JAKAFI 25 MG tablet Generic drug:  ruxolitinib phosphate Take 25 mg by mouth 2 (two) times daily.   latanoprost 0.005 % ophthalmic solution Commonly known as:  XALATAN   lipase/protease/amylase 36000 UNITS Cpep capsule Commonly known as:  CREON Take 1 capsule (36,000 Units total) by mouth 3 (three) times daily with meals.   loperamide 2 MG tablet Commonly known as:  IMODIUM A-D Take 1-2 tablets (2-4 mg total) by mouth daily as needed for diarrhea or loose stools.   metoprolol tartrate 25 MG tablet Commonly known as:  LOPRESSOR Take 0.5 tablets (12.5 mg total) by mouth 2 (two) times daily.   mirtazapine 7.5 MG tablet Commonly known as:  REMERON Take 1 tablet (7.5 mg total) by mouth at bedtime.   ondansetron 4 MG disintegrating tablet Commonly known as:   ZOFRAN-ODT Take 1 tablet (4 mg total) by mouth every 8 (eight) hours as needed.   pantoprazole 40 MG tablet Commonly known as:  PROTONIX Take 1 tablet (40 mg total) by mouth daily.   promethazine 25 MG tablet Commonly known as:  PHENERGAN Take 0.5 tablets (12.5 mg total) by mouth every 6 (six) hours as needed for nausea or vomiting.   rosuvastatin 20 MG tablet Commonly known as:  CRESTOR TAKE 1 TABLET(20 MG) BY MOUTH DAILY   saccharomyces boulardii 250 MG capsule Commonly known as:  FLORASTOR Take 1 capsule (250 mg total) by mouth 2 (two) times daily.   spironolactone 50 MG tablet Commonly known as:  ALDACTONE Take 0.5 tablets (25 mg total) by mouth daily.   triamcinolone ointment 0.5 % Commonly known as:  KENALOG Apply 1 application topically 2 (two) times daily.       Allergies:  Allergies  Allergen Reactions  . Penicillins Swelling    Has patient had a PCN reaction causing immediate rash, facial/tongue/throat swelling, SOB or lightheadedness with hypotension: No Has patient had a PCN reaction causing severe rash involving mucus membranes or skin necrosis: No Has patient had a PCN reaction that required hospitalization: No Has patient had a PCN reaction occurring within the last 10 years: No If all of the above answers are "NO", then may proceed with Cephalosporin use.  Marland Kitchen Lisinopril Cough  . Tape Hives  . Doxycycline Hives, Swelling and Rash  . Latex Hives, Itching and Rash    Past Medical History, Surgical history, Social history, and Family History were reviewed and updated.  Review of Systems: Review of Systems  Constitutional: Negative.   HENT: Negative.  Negative for hearing loss.   Eyes: Negative.   Respiratory: Positive for shortness of breath.   Cardiovascular: Positive for leg swelling.  Gastrointestinal: Negative.   Genitourinary: Positive for frequency.  Musculoskeletal: Positive for joint pain and myalgias.  Skin: Negative.   Neurological:  Negative.   Endo/Heme/Allergies: Negative.   Psychiatric/Behavioral: Negative.       Physical Exam:  weight is 222 lb (100.7 kg). Her oral temperature is 98.1 F (36.7 C). Her blood pressure is 147/51 (abnormal) and her pulse is 80. Her respiration is 20 and oxygen saturation is 100%.   Wt Readings from Last 3 Encounters:  06/23/18 222 lb (100.7 kg)  06/09/18 211 lb 8 oz (95.9 kg)  06/02/18 214 lb (97.1 kg)    Physical Exam  Constitutional: Amanda [name] is oriented to person, place, and time.  HENT:  Head: Normocephalic and atraumatic.  Mouth/Throat: Oropharynx is clear and moist.  Eyes: Pupils are equal, round, and reactive to light. EOM are normal.  Neck: Normal range of motion.  Cardiovascular: Normal rate, regular rhythm and normal heart sounds.  Pulmonary/Chest: Effort normal and breath sounds normal.  Lungs show some decreased breath sounds throughout both lung fields.  Amanda [name] has good breath sounds bilaterally.  Abdominal: Soft. Bowel sounds are normal.  Abdominal exam shows a somewhat obese abdomen.  There is no obvious fluid wave.  I cannot palpate a liver or spleen tip.  Musculoskeletal: Normal range of motion. Amanda [name] exhibits no edema, tenderness or deformity.  Extremities shows 2+ edema in her legs.  Lymphadenopathy:    Amanda [name] has no cervical adenopathy.  Neurological: Amanda [name] is alert and oriented to person, place, and time.  Skin: Skin is warm and dry. No rash noted. No erythema.  Psychiatric: Amanda [name] has a normal mood and affect. Her behavior is normal. Judgment and thought content normal.  Vitals reviewed.    Lab Results  Component Value Date   WBC 11.6 (H) 06/23/2018   HGB 9.0 (L) 06/23/2018   HCT 29.6 (L) 06/23/2018   MCV 81.5 06/23/2018   PLT 193 06/23/2018   Lab Results  Component Value Date   FERRITIN 203 04/13/2018   IRON 82 04/13/2018   TIBC 304 04/13/2018   UIBC 221 04/13/2018   IRONPCTSAT 27 04/13/2018   Lab Results  Component Value Date   RETICCTPCT 2.4  01/30/2014   RBC 3.63 (L) 06/23/2018   No results found for: Nils Pyle [facility] Lab Results  Component Value Date   IGA 233 03/29/2014   No results found for: Kathrynn Ducking, MSPIKE, SPEI   Chemistry      Component Value Date/Time   NA 138 06/23/2018 1326   NA 140 10/12/2017 1013   NA 138 01/21/2017 0806   K 3.3 06/23/2018 1326   K 3.7 10/12/2017 1013   K 4.1 01/21/2017 0806   CL 114 (H) 06/23/2018 1326   CL 103 10/12/2017 1013   CL 103 02/16/2013 0900   CO2 22 06/23/2018 1326   CO2 25 10/12/2017 1013   CO2 21 (L) 01/21/2017 0806   BUN 17 06/23/2018 1326   BUN 10 10/12/2017 1013   BUN 12.2 01/21/2017 0806   CREATININE 1.30 (H) 06/23/2018 1326   CREATININE 0.9 10/12/2017 1013   CREATININE 1.0 01/21/2017 0806      Component Value Date/Time   CALCIUM 9.0 06/23/2018 1326   CALCIUM 8.7 10/12/2017 1013   CALCIUM 9.3 01/21/2017 0806   ALKPHOS 217 (H) 06/23/2018 1326   ALKPHOS 97 (H) 10/12/2017 1013   ALKPHOS 112 01/21/2017 0806   AST 62 (H) 06/23/2018 1326   AST 37 (H) 01/21/2017 0806   ALT 28 06/23/2018 1326   ALT 25 10/12/2017 1013   ALT 16 01/21/2017 0806   BILITOT 0.9 06/23/2018 1326   BILITOT 0.89 01/21/2017 0806      Impression and Plan: Amanda [name] is a very pleasant 77 yo caucasian female with polycythemia vera, JAK-2 positive.   Again, looks like the Shanon Brow is helping.  Amanda [name] does look better.  I am a little worried about the anemia.  We will have to watch this closely.  It might be from the [place].  I would not think that Amanda [name] is iron deficient.  It is conceivable that we may have to consider a transfusion for her if her hemoglobin is lower when I see her back.  I talked to Amanda [name] and her  husband about this.  They understand.  They would be amenable to a blood transfusion.  Again, Amanda [name] looks better.  Amanda [name] feels better.  Amanda [name] is looking forward to Lenna Sciara, MD 9/6/20192:33 PM

## 2018-06-23 NOTE — Telephone Encounter (Signed)
Tried to return Gerald Stabs' call.  Received a message that the mailbox is full and cannot accept messages.  Will try again at a later time.

## 2018-06-23 NOTE — Telephone Encounter (Signed)
Copied from [place] [id number]. Topic: Quick Communication - See Telephone Encounter >> Jun 23, 2018 11:46 AM Synthia Innocent wrote: CRM for notification. See Telephone encounter for: 06/23/18. [facility] calling for physical therapy 1x a week for 1 week, 2x a week for 5 weeks

## 2018-06-23 NOTE — Telephone Encounter (Signed)
Ok for verbal orders ?

## 2018-06-26 LAB — IRON AND TIBC
IRON: 111 ug/dL (ref 41–142)
SATURATION RATIOS: 34 % (ref 21–57)
TIBC: 329 ug/dL (ref 236–444)
UIBC: 217 ug/dL

## 2018-06-26 LAB — FERRITIN: Ferritin: 109 ng/mL (ref 11–307)

## 2018-06-27 NOTE — Progress Notes (Signed)
Dr. [name] would like her to be on Lasix 20 mg daily. He wants [place] to manage this. She has a prescription for Lasix, can we make sure she is taking this and if needed send in a new prescription   thanks

## 2018-06-28 ENCOUNTER — Telehealth: Payer: Self-pay | Admitting: Family Medicine

## 2018-06-28 MED ORDER — FUROSEMIDE 20 MG PO TABS: 20.0000 mg | ORAL_TABLET | Freq: Every day | ORAL | 0 refills | Status: DC

## 2018-06-28 NOTE — Telephone Encounter (Signed)
Spoke to Mr. Chouinard. He said the pt did have a rx for furosemide 20 mg to take daily.  He estimated by looking at the bottle that she had 8-10 pills left.  I informed him that I will send in a new prescription.  No further action required.

## 2018-06-28 NOTE — Telephone Encounter (Signed)
Left a message for a return call.

## 2018-06-28 NOTE — Addendum Note (Signed)
Addended by: Miles Costain T on: 06/28/2018 11:17 AM   Modules accepted: Orders

## 2018-06-28 NOTE — Telephone Encounter (Addendum)
-----   Message from [name], NP sent at 06/27/2018  6:34 PM EDT -----  Routed Note   Author: [name], NP Service: - Author Type: Nurse Practitioner  Filed: 06/27/2018 6:34 PM Encounter Date: 06/23/2018 Status: Signed  Editor: [name], NP (Nurse Practitioner)     Show:Clear all [x] Manual[] Template[] Copied  Added by: [x] Nafziger, Tommi Rumps, NP  [] Hover for details Dr. [name] would like her to be on Lasix 20 mg daily. He wants [place] to manage this. She has a prescription for Lasix, can we make sure she is taking this and if needed send in a new prescription   thanks        ----- Message ----- From: Volanda Napoleon, MD Sent: 06/23/2018   2:56 PM EDT To: [name], NP

## 2018-06-29 ENCOUNTER — Telehealth: Payer: Self-pay | Admitting: Adult Health

## 2018-06-29 ENCOUNTER — Other Ambulatory Visit: Payer: Self-pay | Admitting: Adult Health

## 2018-06-29 MED ORDER — POTASSIUM CHLORIDE CRYS ER 20 MEQ PO TBCR: 20.0000 meq | EXTENDED_RELEASE_TABLET | Freq: Every day | ORAL | 3 refills | Status: DC

## 2018-06-29 NOTE — Telephone Encounter (Signed)
Spoke to [employer].  Pt is taking her lasix.  It was given to her while Jonelle Sidle was in the home today.  Pt has 5-8lbs of weight gain (fluctuates).  Original weight by Jonelle Sidle was 220lb.  She has been out to see the pt several times and her weights have been 225, 228 and back down to 225.  Legs are swelling.  Tiffany has been measuring her legs and reports increase in size.  Also reports pt is SOB.  Margarita Mail that I will discuss with Tommi Rumps and call the pt if needed.

## 2018-06-29 NOTE — Telephone Encounter (Signed)
Copied from [place] [phone]. Topic: General - Other >> Jun 29, 2018 11:28 AM Yvette Rack wrote: Reason for CRM: Tiffany with [facility] states pt seems to be experiencing shortness of breath and she noticed an increase in pt weight. Tiffany requests a call back from nurse. Cb# [phone]

## 2018-06-29 NOTE — Telephone Encounter (Addendum)
Spoke to the pt.  Advised to increase her lasix to 40 mg daily (2 tabs) and to pick up potassium at the pharmacy.  She was unable to come in on Tuesday of next week but did schedule for Wednesday.  Notified Tiffany of all.  She will follow up with the pt next Thursday.

## 2018-06-29 NOTE — Telephone Encounter (Signed)
Is she taking her lasix?

## 2018-06-29 NOTE — Telephone Encounter (Signed)
Have her take 40 mg of Lasix daily. I have sent in potassium supplement for her to take with this.   Please have her follow up next Tuesday

## 2018-06-30 NOTE — Telephone Encounter (Signed)
Left a message for a return call.  Have made multiple attempts to reach the [place].  Will now close the note.

## 2018-07-05 ENCOUNTER — Telehealth: Payer: Self-pay | Admitting: Adult Health

## 2018-07-05 ENCOUNTER — Encounter: Payer: Self-pay | Admitting: Adult Health

## 2018-07-05 ENCOUNTER — Ambulatory Visit (INDEPENDENT_AMBULATORY_CARE_PROVIDER_SITE_OTHER): Payer: PPO | Admitting: Adult Health

## 2018-07-05 VITALS — BP 160/72 | HR 86 | Temp 97.7°F | Wt 229.0 lb

## 2018-07-05 DIAGNOSIS — R600 Localized edema: Secondary | ICD-10-CM | POA: Diagnosis not present

## 2018-07-05 DIAGNOSIS — F329 Major depressive disorder, single episode, unspecified: Secondary | ICD-10-CM

## 2018-07-05 DIAGNOSIS — Z23 Encounter for immunization: Secondary | ICD-10-CM | POA: Diagnosis not present

## 2018-07-05 LAB — BASIC METABOLIC PANEL
BUN: 20 mg/dL (ref 6–23)
CALCIUM: 9.1 mg/dL (ref 8.4–10.5)
CO2: 25 mEq/L (ref 19–32)
Chloride: 108 mEq/L (ref 96–112)
Creatinine, Ser: 1.06 mg/dL (ref 0.40–1.20)
GFR: 53.17 mL/min — ABNORMAL LOW (ref >60.00)
Glucose, Bld: 116 mg/dL — ABNORMAL HIGH (ref 70–99)
Potassium: 4.2 mEq/L (ref 3.5–5.1)
Sodium: 141 mEq/L (ref 135–145)

## 2018-07-05 LAB — CBC WITH DIFFERENTIAL/PLATELET
BASOS ABS: 0.1 10*3/uL (ref 0.0–0.1)
Basophils Relative: 0.9 % (ref 0.0–3.0)
EOS ABS: 0.1 10*3/uL (ref 0.0–0.7)
Eosinophils Relative: 1 % (ref 0.0–5.0)
HEMATOCRIT: 32.8 % — AB (ref 36.0–46.0)
HEMOGLOBIN: 10.5 g/dL — AB (ref 12.0–15.0)
LYMPHS PCT: 19.3 % (ref 12.0–46.0)
Lymphs Abs: 2.3 10*3/uL (ref 0.7–4.0)
MCHC: 32 g/dL (ref 30.0–36.0)
MCV: 79.6 fl (ref 78.0–100.0)
Monocytes Absolute: 0.7 10*3/uL (ref 0.1–1.0)
Monocytes Relative: 5.5 % (ref 3.0–12.0)
Neutro Abs: 8.9 10*3/uL — ABNORMAL HIGH (ref 1.4–7.7)
Neutrophils Relative %: 73.3 % (ref 43.0–77.0)
Platelets: 242 10*3/uL (ref 150.0–400.0)
RBC: 4.12 Mil/uL (ref 3.87–5.11)
RDW: 26.9 % — ABNORMAL HIGH (ref 11.5–15.5)
WBC: 12.1 10*3/uL — AB (ref 4.0–10.5)

## 2018-07-05 MED ORDER — POTASSIUM CHLORIDE CRYS ER 10 MEQ PO TBCR: 10.0000 meq | EXTENDED_RELEASE_TABLET | Freq: Every day | ORAL | 1 refills | Status: DC

## 2018-07-05 NOTE — Telephone Encounter (Signed)
Updated patients wife on her labs   We are going to try her with 20 mg of lasix daily. Will decrease K from 20 meq to 10 meq. She will have labs done next week with Dr. Deberah Pelton

## 2018-07-05 NOTE — Progress Notes (Signed)
Subjective:    Patient ID: Amanda [name], female    DOB: 1941/01/13, 77 y.o.   MRN: [id number]  HPI  77 year old female who  has a past medical history of Allergic rhinitis (01/23/2016), C. difficile colitis, CAD (coronary artery disease), Cancer ([place]), Candidiasis of skin (09/30/2014), Cirrhosis ([place]), Depression, Diabetes mellitus (2008), Gout, Herpes simplex, Hyperglycemia (05/31/2013), Hyperlipidemia, Hypertension, Myocardial infarction [facility]), Neuromuscular disorder ([place]), Obesity, Polycythemia, and Psoriasis.  She presents to the office today for follow up regarding lasix use. She saw Dr. Jonette Eva and he wanted her on lasix. She was placed on 40 mg for 5 days and then advised to cut down to 20 mg. She did this and then I received a call from her home health RN who advised that she had gained about 5-8 pounds pounds, Lasix was increase to 40 mg daily and potassium was added.  She reports that she been taking the Lasix as directed but finds it difficult to swallow potassium pills and reports that this is caused GI upset.  Today in the office she reports that overall she is feeling much improved, her depression has started to reside, she is not spending as much time and her bed sleeping is getting out of the house periodically.  Husband has been making her walk, took her to [facility] the other day and made her walk throughout the store.  She does report feeling worn out after this walk and had some shortness of breath.  Her biggest complaint is that she continues to have lower extremity edema  Wt Readings from Last 3 Encounters:  07/05/18 229 lb (103.9 kg)  06/23/18 222 lb (100.7 kg)  06/09/18 211 lb 8 oz (95.9 kg)    Review of Systems See HPI   Past Medical History:  Diagnosis Date  . Allergic rhinitis 01/23/2016  . C. difficile colitis   . CAD (coronary artery disease)    a. s/p STEMI in 01/2015 with 95% LCx stenosis and distal 80% LCx stenosis (DESx2 placed)  . Cancer ([place])    SKIN  .  Candidiasis of skin 09/30/2014  . Cirrhosis ([place])   . Depression   . Diabetes mellitus 2008  . Gout   . Herpes simplex   . Hyperglycemia 05/31/2013  . Hyperlipidemia   . Hypertension   . Myocardial infarction ([place])   . 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 Obesity   . Polycythemia    Dr. Elease Hashimoto- [facility]  . Psoriasis     Social History   Socioeconomic History  . Marital status: Married    Spouse name: Not on file  . Number of children: 2  . Years of education: 2 yr colle  . Highest education level: Not on file  Occupational History  . Occupation: housewife  Social Needs  . Financial resource strain: Not on file  . Food insecurity:    Worry: Not on file    Inability: Not on file  . Transportation needs:    Medical: Not on file    Non-medical: Not on file  Tobacco Use  . Smoking status: Former Smoker    Years: 48.00    Types: Cigarettes    Last attempt to quit: 10/18/1986    Years since quitting: 31.7  . Smokeless tobacco: Never Used  Substance and Sexual Activity  . Alcohol use: No    Alcohol/week: 0.0 standard drinks  . Drug use: No  . Sexual activity: Yes    Partners: Male  Birth control/protection: None  Lifestyle  . Physical activity:    Days per week: Not on file    Minutes per session: Not on file  . Stress: Not on file  Relationships  . Social connections:    Talks on phone: Not on file    Gets together: Not on file    Attends religious service: Not on file    Active member of club or organization: Not on file    Attends meetings of clubs or organizations: Not on file    Relationship status: Not on file  . Intimate partner violence:    Fear of current or ex partner: Not on file    Emotionally abused: Not on file    Physically abused: Not on file    Forced sexual activity: Not on file  Other Topics Concern  . Not on file  Social History Narrative   2 caffeine drink per day.  No regular exercise.     Retired from the bank   2  children (Daughter and a son) son has morbid obesity   2 grandchildren   3 great grandchildren    Past Surgical History:  Procedure Laterality Date  . BIOPSY  05/26/2018   Procedure: BIOPSY;  Surgeon: [name], MD;  Location[facility] ENDOSCOPY;  Service: Endoscopy;;  . BREAST SURGERY Left    milk duct  . CATARACT EXTRACTION, BILATERAL    . COLONOSCOPY    . ESOPHAGOGASTRODUODENOSCOPY N/A 05/26/2018   Procedure: ESOPHAGOGASTRODUODENOSCOPY (EGD);  Surgeon: [name], MD;  Location: [facility] ENDOSCOPY;  Service: Endoscopy;  Laterality: N/A;  . IR PARACENTESIS  05/25/2018  . LEFT HEART CATHETERIZATION WITH CORONARY ANGIOGRAM N/A 01/27/2015   Procedure: LEFT HEART CATHETERIZATION WITH CORONARY ANGIOGRAM;  Surgeon: [name], MD;  Location: [facility] CATH LAB;  Service: Cardiovascular;  Laterality: N/A;  . TONSILLECTOMY    . TOTAL ABDOMINAL HYSTERECTOMY W/ BILATERAL SALPINGOOPHORECTOMY     for heavy periods with appendectomy    Family History  Problem Relation Age of Onset  . Diabetes Mother   . Heart disease Mother        CAD  . Hyperlipidemia Mother   . Hypertension Mother   . Kidney disease Mother   . Kidney disease Father   . Breast cancer Maternal Aunt   . Heart disease Maternal Grandmother   . Heart disease Other        maternal aunts and uncles    Allergies  Allergen Reactions  . Penicillins Swelling    Has patient had a PCN reaction causing immediate rash, facial/tongue/throat swelling, SOB or lightheadedness with hypotension: No Has patient had a PCN reaction causing severe rash involving mucus membranes or skin necrosis: No Has patient had a PCN reaction that required hospitalization: No Has patient had a PCN reaction occurring within the last 10 years: No If all of the above answers are "NO", then may proceed with Cephalosporin use.  Marland Kitchen Lisinopril Cough  . Tape Hives  . Doxycycline Hives, Swelling and Rash  . Latex Hives, Itching and Rash    Current Outpatient Medications  on File Prior to Visit  Medication Sig Dispense Refill  . acetaminophen (TYLENOL) 325 MG tablet Take 2 tablets (650 mg total) by mouth every 6 (six) hours as needed for mild pain (or Fever >/= 101).    Marland Kitchen amLODipine (NORVASC) 10 MG tablet Take 1 tablet (10 mg total) by mouth daily. 90 tablet 1  . Blood Glucose Monitoring Suppl (ACCU-CHEK GUIDE) w/Device KIT 1 kit  by Does not apply route as directed. 1 kit 0  . diphenoxylate-atropine (LOMOTIL) 2.5-0.025 MG tablet Take 1-2 tablets by mouth 2 (two) times daily as needed for diarrhea or loose stools. 60 tablet 0  . Dulaglutide (TRULICITY) 2.83 TD/1.7OH SOPN Inject 0.75 mg into the skin once a week. 0.5 mL 1  . fluconazole (DIFLUCAN) 100 MG tablet Take 1 tablet (100 mg total) by mouth daily. 5 tablet 0  . furosemide (LASIX) 20 MG tablet Take 1 tablet (20 mg total) by mouth daily. 90 tablet 0  . gabapentin (NEURONTIN) 100 MG capsule Take 1 capsule (100 mg total) by mouth 2 (two) times daily. 60 capsule 0  . glucose blood (ACCU-CHEK GUIDE) test strip Use to check blood sugars 3 times daily 100 each 12  . insulin aspart (NOVOLOG) 100 UNIT/ML injection Inject 0-9 Units into the skin 3 (three) times daily with meals. 10 mL 11  . Insulin Pen Needle (BD PEN NEEDLE NANO U/F) 32G X 4 MM MISC USE TO INJECT INSULIN AS DIRECTED. 100 each 0  . latanoprost (XALATAN) 0.005 % ophthalmic solution     . lipase/protease/amylase (CREON) 36000 UNITS CPEP capsule Take 1 capsule (36,000 Units total) by mouth 3 (three) times daily with meals. 180 capsule 3  . loperamide (IMODIUM A-D) 2 MG tablet Take 1-2 tablets (2-4 mg total) by mouth daily as needed for diarrhea or loose stools. 30 tablet 1  . metoprolol tartrate (LOPRESSOR) 25 MG tablet Take 0.5 tablets (12.5 mg total) by mouth 2 (two) times daily. 60 tablet 0  . mirtazapine (REMERON) 7.5 MG tablet Take 1 tablet (7.5 mg total) by mouth at bedtime. 30 tablet 1  . ondansetron (ZOFRAN ODT) 4 MG disintegrating tablet Take 1  tablet (4 mg total) by mouth every 8 (eight) hours as needed. (Patient taking differently: Take 4 mg by mouth every 8 (eight) hours as needed for nausea or vomiting. ) 10 tablet 0  . ONETOUCH VERIO test strip USE TO TEST BLOOD SUGAR THREE TIMES DAILY 300 each 2  . pantoprazole (PROTONIX) 40 MG tablet Take 1 tablet (40 mg total) by mouth daily. 30 tablet 1  . potassium chloride SA (K-DUR,KLOR-CON) 20 MEQ tablet Take 1 tablet (20 mEq total) by mouth daily. 30 tablet 3  . promethazine (PHENERGAN) 25 MG tablet Take 0.5 tablets (12.5 mg total) by mouth every 6 (six) hours as needed for nausea or vomiting. 30 tablet 1  . rosuvastatin (CRESTOR) 20 MG tablet TAKE 1 TABLET(20 MG) BY MOUTH DAILY 90 tablet 3  . ruxolitinib phosphate (JAKAFI) 25 MG tablet Take 25 mg by mouth 2 (two) times daily.    Marland Kitchen saccharomyces boulardii (FLORASTOR) 250 MG capsule Take 1 capsule (250 mg total) by mouth 2 (two) times daily. 30 capsule 0  . spironolactone (ALDACTONE) 50 MG tablet Take 0.5 tablets (25 mg total) by mouth daily. 30 tablet 0  . triamcinolone ointment (KENALOG) 0.5 % Apply 1 application topically 2 (two) times daily. 30 g 0   No current facility-administered medications on file prior to visit.     BP (!) 160/72   Pulse 86   Temp 97.7 F (36.5 C)   Wt 229 lb (103.9 kg)   SpO2 96%   BMI 39.31 kg/m       Objective:   Physical Exam  Constitutional: She is oriented to person, place, and time. She appears well-developed and well-nourished. No distress.  Cardiovascular: Normal rate, regular rhythm, normal heart sounds and intact distal pulses.  Pulmonary/Chest: Effort normal and breath sounds normal.  Abdominal: Soft. She exhibits distension. There is no tenderness.  No apparent fluid wave.  Unable to palpate liver  Musculoskeletal: She exhibits edema (+2 pitting edema lateral lower extremities). She exhibits no tenderness or deformity.  Cane with a rolling walker with seat.  She is able to get out of exam  chair without difficulty  Neurological: She is alert and oriented to person, place, and time.  Skin: She is not diaphoretic.  Nursing note and vitals reviewed.     Assessment & Plan:  1. Lower extremity edema -I am glad that she starting to feel better and the depression is improving.  She seems to be getting her strength back.  We will have her back down to 20 mg of Lasix and take 10 mg of potassium for the time being.  We will check CBC and BMP. - Basic Metabolic Panel - CBC with Differential/Platelet  2. Need for prophylactic vaccination and inoculation against influenza  - Flu vaccine HIGH DOSE PF (Fluzone High dose)   Amanda Peng, NP

## 2018-07-06 ENCOUNTER — Telehealth: Payer: Self-pay | Admitting: Physician Assistant

## 2018-07-06 ENCOUNTER — Telehealth: Payer: Self-pay | Admitting: *Deleted

## 2018-07-06 NOTE — Telephone Encounter (Signed)
[facility] is calling the office to notify us of facial swelling, abdominal swelling and lower extremity edema. They also state she is having more shortness of breath.   This office treats patient for polycythemia vera. Her PCP manages the home health. The symptoms appear be to be fluid overload related, possibly cardiac. Her PCPs office has recently drawn a CBC which showed that her HGB and HCT are not elevated. Directed Tiffany to call the PCP for management of symptoms. She will notify them.

## 2018-07-06 NOTE — Telephone Encounter (Signed)
Patients in-home nurse Tiffany states pt had has a lot of fluid on her stomach and wants to know what to do. Tiffany states okay to call her with questions but call pt # for appt.

## 2018-07-07 ENCOUNTER — Other Ambulatory Visit: Payer: Self-pay

## 2018-07-07 DIAGNOSIS — K7460 Unspecified cirrhosis of liver: Secondary | ICD-10-CM

## 2018-07-07 MED ORDER — SPIRONOLACTONE 50 MG PO TABS: 100.0000 mg | ORAL_TABLET | Freq: Every day | ORAL | 6 refills | Status: DC

## 2018-07-07 NOTE — Telephone Encounter (Signed)
It looks like (from epic med list review) she is on very low dose diuretics.  Would like her to change her aldactone to 134m po once daily, and change her lasix to 440mpo once daily.  She will probably need new scripts for these (one month each with 5 refills each).  She should continue with plans for AE apt next week. Will need to have BMET the day prior to [place] thanks

## 2018-07-07 NOTE — Telephone Encounter (Signed)
Spoke with the home health nurse. She is a Dr [name] patientWe last saw the patient in April 2019. She was hospitalized in August for SOB.  Most recently she was seen by her PCP because she was again having SOB, increased LE edema and tiredness. The lasix was increased. She was told at the visit she was collecting abdominal fluid.She is not improving and is contact us about this. Nurse reports SOB, facial edema and weight gain. No appointments in office for a week.

## 2018-07-07 NOTE — Telephone Encounter (Signed)
Darn the inaccurate med list. Yes, i'd like her on aldactone at that dose.  Thanks

## 2018-07-07 NOTE — Telephone Encounter (Signed)
Reviewing medications with the patient's husband. He "fixes her pills." Will increase the Lasix to 40 mg as of today.  She is not on Aldactone. Not sure why it is still on her medication list. I can send in the Rx. Do you want to start her at Aldactone 100 mg day? Thanks

## 2018-07-07 NOTE — Telephone Encounter (Signed)
Medication listed corrected. Aldactone 100 mg once daily to the pharmacy. Fill a total of 6 times.

## 2018-07-07 NOTE — Telephone Encounter (Signed)
Discussed with Beth , advised to see PCP asap or ER visit - cannot sort out over phone  CHF, vs Cirrhosis related or both, needs labs and imaging .

## 2018-07-10 ENCOUNTER — Other Ambulatory Visit: Payer: Self-pay

## 2018-07-10 ENCOUNTER — Telehealth: Payer: Self-pay

## 2018-07-10 MED ORDER — DULAGLUTIDE 1.5 MG/0.5ML ~~LOC~~ SOAJ: 1.5000 mg | SUBCUTANEOUS | 1 refills | Status: DC

## 2018-07-10 NOTE — Telephone Encounter (Signed)
Refills faxed back to [facility].

## 2018-07-11 ENCOUNTER — Ambulatory Visit: Payer: PPO | Admitting: Adult Health

## 2018-07-11 ENCOUNTER — Other Ambulatory Visit (INDEPENDENT_AMBULATORY_CARE_PROVIDER_SITE_OTHER): Payer: PPO

## 2018-07-11 ENCOUNTER — Other Ambulatory Visit: Payer: Self-pay

## 2018-07-11 DIAGNOSIS — K7460 Unspecified cirrhosis of liver: Secondary | ICD-10-CM | POA: Diagnosis not present

## 2018-07-11 LAB — BASIC METABOLIC PANEL
BUN: 15 mg/dL (ref 6–23)
CHLORIDE: 109 meq/L (ref 96–112)
CO2: 22 meq/L (ref 19–32)
Calcium: 9.3 mg/dL (ref 8.4–10.5)
Creatinine, Ser: 0.93 mg/dL (ref 0.40–1.20)
GFR: 61.83 mL/min (ref >60.00)
GLUCOSE: 152 mg/dL — AB (ref 70–99)
POTASSIUM: 3.7 meq/L (ref 3.5–5.1)
SODIUM: 140 meq/L (ref 135–145)

## 2018-07-13 ENCOUNTER — Encounter: Payer: Self-pay | Admitting: Physician Assistant

## 2018-07-13 ENCOUNTER — Ambulatory Visit: Payer: PPO | Admitting: Physician Assistant

## 2018-07-13 VITALS — BP 160/66 | HR 88 | Ht 63.0 in | Wt 233.0 lb

## 2018-07-13 DIAGNOSIS — K7290 Hepatic failure, unspecified without coma: Secondary | ICD-10-CM

## 2018-07-13 DIAGNOSIS — R188 Other ascites: Secondary | ICD-10-CM | POA: Diagnosis not present

## 2018-07-13 DIAGNOSIS — R609 Edema, unspecified: Secondary | ICD-10-CM

## 2018-07-13 MED ORDER — FUROSEMIDE 40 MG PO TABS: ORAL_TABLET | ORAL | 3 refills | Status: DC

## 2018-07-13 NOTE — Patient Instructions (Addendum)
We sent prescriptions to [facility] and [facility].  1. Lasix 40 mg.  Stop the Lasix / Furosemide 20 mg.   Continue Aldactone / Spironioactone50 mg/ 2 tablets each morning. Stop K-Dur ( Potassium).  All other medications are the same.   Call back next Wednesday July 19, 2018.  Ask for Amy's nurse [place].  [phone]. If your not better you may have a Paracentesis.

## 2018-07-13 NOTE — Progress Notes (Signed)
Subjective:    Patient ID: Amanda [name], female    DOB: 24-Jun-1941, 77 y.o.   MRN: [id number]  HPI Amanda [name] is a 77 year old white female, known to Dr. [name] with decompensated Karlene Lineman cirrhosis.  She had admission in June 2019 with a CVA. 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 difficile He was readmitted in August 2019 with anemia and heme positive stool and hemoglobin of 6.2 with baseline of 12. She required transfusions and underwent EGD with Dr. Lyndel Safe with finding of gastritis and duodenitis but no esophageal varices.  She also had paracentesis during that admission with removal of 2.7 L of fluid. She has been on Lasix 20 mg daily and Aldactone 100 mg daily. She comes in today with complaints of increased edema in her legs and increased abdominal distention.,  She is very debilitated but does feel little bit more short of breath than usual and always has shortness of breath with any exertion.  Appetite is been okay she denies any abdominal discomfort.  She says her legs are uncomfortable secondary to swelling. Weight was 214 in the office in April she is up to 233 today.  She had an office visit with primary care 07/05/2018 also complaining of increased and lower extremity edema Her Lasix had previously been increased to 40 mg p.o. daily and she was taking 10 mEq of K dur.  No mention of Aldactone at that time and because she was improving Lasix was dropped back to 20 mg daily.  Her husband manages her medications and also at some point they have had some in-home help.  He thinks he has messed up her medications and actually stopped giving her the Lasix over the past week. She is up 4 pounds since last office visit week ago  Review of Systems Pertinent positive and negative review of systems  were noted in the above HPI section.  All other review of systems was otherwise negative.  Outpatient Encounter Medications as of 07/13/2018  Medication Sig  . acetaminophen (TYLENOL) 325 MG tablet Take 2 tablets (650 mg total) by mouth every 6 (six) hours as needed for mild pain (or Fever >/= 101).  Marland Kitchen amLODipine (NORVASC) 10 MG tablet Take 1 tablet (10 mg total) by mouth daily.  . Blood Glucose Monitoring Suppl (ACCU-CHEK GUIDE) w/Device KIT 1 kit by Does not apply route as directed.  . diphenoxylate-atropine (LOMOTIL) 2.5-0.025 MG tablet Take 1-2 tablets by mouth 2 (two) times daily as needed for diarrhea or loose stools.  . Dulaglutide (TRULICITY) 1.5 AY/3.0ZS SOPN Inject 1.5 mg into the skin once a week.  Marland Kitchen glucose blood (ACCU-CHEK GUIDE) test strip Use to check blood sugars 3 times daily  . insulin aspart (NOVOLOG) 100 UNIT/ML injection Inject 0-9 Units into the skin 3 (three) times daily with meals.  . Insulin Pen Needle (BD PEN NEEDLE NANO U/F) 32G X 4 MM MISC USE TO INJECT INSULIN AS DIRECTED.  Marland Kitchen latanoprost (XALATAN) 0.005 % ophthalmic solution   . lipase/protease/amylase (CREON) 36000 UNITS CPEP capsule Take 1 capsule (36,000 Units total) by mouth 3 (three) times daily with meals.  Marland Kitchen loperamide (IMODIUM A-D) 2 MG tablet Take 1-2 tablets (2-4 mg total) by mouth daily as needed for diarrhea or loose stools.  . metoprolol tartrate (LOPRESSOR) 25 MG tablet Take 0.5 tablets (12.5 mg  total) by mouth 2 (two) times daily.  . mirtazapine (REMERON) 7.5 MG tablet Take 1 tablet (7.5 mg total) by mouth at bedtime.  . ondansetron (ZOFRAN ODT) 4 MG disintegrating tablet Take 1 tablet (4 mg total) by mouth every 8 (eight) hours as needed. (Patient taking differently: Take 4 mg by mouth every 8 (eight) hours as needed for nausea or vomiting. )  . ONETOUCH VERIO test strip USE TO TEST BLOOD SUGAR THREE TIMES DAILY  . promethazine (PHENERGAN) 25 MG tablet Take 0.5 tablets (12.5 mg total) by mouth every 6  (six) hours as needed for nausea or vomiting.  . rosuvastatin (CRESTOR) 20 MG tablet TAKE 1 TABLET(20 MG) BY MOUTH DAILY  . ruxolitinib phosphate (JAKAFI) 25 MG tablet Take 25 mg by mouth 2 (two) times daily.  Marland Kitchen spironolactone (ALDACTONE) 50 MG tablet Take 2 tablets (100 mg total) by mouth daily.  Marland Kitchen triamcinolone ointment (KENALOG) 0.5 % Apply 1 application topically 2 (two) times daily.  . [DISCONTINUED] furosemide (LASIX) 20 MG tablet Take 1 tablet (20 mg total) by mouth daily.  . [DISCONTINUED] potassium chloride SA (K-DUR,KLOR-CON) 10 MEQ tablet Take 1 tablet (10 mEq total) by mouth daily.  . furosemide (LASIX) 40 MG tablet Take 1 tablet ( 40 mg)  By mouth every morning.  . gabapentin (NEURONTIN) 100 MG capsule Take 1 capsule (100 mg total) by mouth 2 (two) times daily. (Patient not taking: Reported on 07/13/2018)  . [DISCONTINUED] fluconazole (DIFLUCAN) 100 MG tablet Take 1 tablet (100 mg total) by mouth daily.  . [DISCONTINUED] pantoprazole (PROTONIX) 40 MG tablet Take 1 tablet (40 mg total) by mouth daily.  . [DISCONTINUED] saccharomyces boulardii (FLORASTOR) 250 MG capsule Take 1 capsule (250 mg total) by mouth 2 (two) times daily.   No facility-administered encounter medications on file as of 07/13/2018.    Allergies  Allergen Reactions  . Penicillins Swelling    Has patient had a PCN reaction causing immediate rash, facial/tongue/throat swelling, SOB or lightheadedness with hypotension: No Has patient had a PCN reaction causing severe rash involving mucus membranes or skin necrosis: No Has patient had a PCN reaction that required hospitalization: No Has patient had a PCN reaction occurring within the last 10 years: No If all of the above answers are "NO", then may proceed with Cephalosporin use.  Marland Kitchen Lisinopril Cough  . Tape Hives  . Doxycycline Hives, Swelling and Rash  . Latex Hives, Itching and Rash   Patient Active Problem List   Diagnosis Date Noted  . Acute blood loss anemia  05/23/2018  . Dehydration 03/22/2018  . AKI (acute kidney injury) ([place]) 03/22/2018  . CVA (cerebral vascular accident) ([place]) 03/22/2018  . Hip injury, left, subsequent encounter 11/08/2017  . Iron deficiency anemia 12/22/2016  . OSA (obstructive sleep apnea) 04/21/2016  . Osteopenia 04/07/2016  . Lung nodule, solitary 02/05/2016  . Aortic dilatation ([place]) 02/05/2016  . Dyspnea 01/28/2016  . Allergic rhinitis 01/23/2016  . Hepatic cirrhosis ([place]) 10/28/2015  . Irritable bowel syndrome 08/12/2015  . Obesity (BMI 30-39.9) 04/30/2015  . Diabetic retinopathy of both eyes ([place]) 04/18/2015  . Former smoker 04/18/2015  . GAD (generalized anxiety disorder) 04/18/2015  . Status post primary angioplasty with coronary stent 04/18/2015  . Coronary artery disease involving native coronary artery 03/01/2015  . Acute diastolic heart failure ([place]) 02/01/2015  . ST elevation myocardial infarction (STEMI) involving left circumflex coronary artery in recovery phase ([place]) 01/27/2015  . Idioventricular rhythm ([place])   . Diabetic peripheral neuropathy associated  with type 2 diabetes mellitus ([place]) 01/23/2015  . Type 2 diabetes mellitus with diabetic polyneuropathy ([place]) 01/23/2015  . Thrombocytosis ([place]) 07/04/2014  . Cholelithiasis 02/07/2014  . Chronic diarrhea 01/29/2014  . [name] ([place]) 05/31/2013  . Essential hypertension 05/31/2013  . History of Bell's palsy 05/26/2013  . DERMATITIS, ATOPIC 11/16/2010  . DIZZINESS 11/16/2010  . DIASTOLIC DYSFUNCTION 52/77/8242  . Hyperlipidemia 12/15/2009  . Essential hypertension, benign 11/24/2009  . PSORIASIS 11/24/2009  . Depression 09/13/2009  . 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[place]) 07/08/2008   Social History   Socioeconomic History  . Marital status: Married    Spouse name: Not on file  . Number of children: 2  . Years of education: 2 yr colle  . Highest education level: Not on file  Occupational History  . Occupation:  housewife  Social Needs  . Financial resource strain: Not on file  . Food insecurity:    Worry: Not on file    Inability: Not on file  . Transportation needs:    Medical: Not on file    Non-medical: Not on file  Tobacco Use  . Smoking status: Former Smoker    Years: 48.00    Types: Cigarettes    Last attempt to quit: 10/18/1986    Years since quitting: 31.7  . Smokeless tobacco: Never Used  Substance and Sexual Activity  . Alcohol use: No    Alcohol/week: 0.0 standard drinks  . Drug use: No  . Sexual activity: Yes    Partners: Male    Birth control/protection: None  Lifestyle  . Physical activity:    Days per week: Not on file    Minutes per session: Not on file  . Stress: Not on file  Relationships  . Social connections:    Talks on phone: Not on file    Gets together: Not on file    Attends religious service: Not on file    Active member of club or organization: Not on file    Attends meetings of clubs or organizations: Not on file    Relationship status: Not on file  . Intimate partner violence:    Fear of current or ex partner: Not on file    Emotionally abused: Not on file    Physically abused: Not on file    Forced sexual activity: Not on file  Other Topics Concern  . Not on file  Social History Narrative   2 caffeine drink per day.  No regular exercise.     Retired from the bank   2 children (Daughter and a son) son has morbid obesity   2 grandchildren   3 great grandchildren    Ms. Hall's family history includes Breast cancer in her maternal aunt; Diabetes in her mother; Heart disease in her maternal grandmother, mother, and other; Hyperlipidemia in her mother; Hypertension in her mother; Kidney disease in her father and mother.      Objective:    Vitals:   07/13/18 1417  BP: (!) 160/66  Pulse: 88    Physical Exam; well-developed elderly white female in no acute distress, accompanied by her husband and ambulates with a walker pressure 160/66 pulse  88, height 5 foot 3, weight 233, BMI 41.2.  HEENT; nontraumatic normocephalic EOMI PERRLA sclera anicteric oral mucosa moist, Cardiovascular; regular rate and rhythm with S1-S2 soft murmur, Pulmonary ;clear bilaterally, Abdomen obese , nontender with fairly tense ascites bowel sounds present no palpable mass or hepatosplenomegaly, Rectal; exam not done, Extremities; she  has 2+ edema to the knees bilaterally skin warm dry, cap neuro psych alert and oriented, grossly nonfocal mood and affect appropriate no asterixis       Assessment & Plan:   #50 77 year old white female with decompensated Karlene Lineman cirrhosis complicated by ascites and peripheral edema Patient had paracentesis with removal of 2.7 L in August 2019.  Exline She has had an increase in edema and ascites over the past month despite adjustments of her diuretics.  I believe there has  also been some confusion on she and her husband's part regarding her diuretics she may inadvertently have been left off of Lasix over the past week or so.  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  Coronary artery disease status post MI and stent #5.  Congestive heart failure #6.  Hypertension #  [name] #8.  Adult onset diabetes mellitus with peripheral neuropathy  Plan; fully reviewed diuretics with patient and her husband, we reviewed pill pictures. Will restart Lasix at 40 mg p.o. daily and have sent a new prescription for 40 mg tablets Continue Aldactone 50 mg 2 tablets p.o. every morning Continue low-sodium diet She will weigh herself daily.    Discontinue potassium  supplement Patient is asked to call back in 5 to 6 days, if she is not having any weight loss or has increase in discomfort from ascites we can set her up for paracentesis and Hopefully she will not need this Will plan office follow-up in 2 to 3 weeks with Amy Esterwood PA-C, and will also repeat be met in 2 weeks.   Amy Genia Harold PA-C 07/13/2018   Cc: [name], NP

## 2018-07-14 ENCOUNTER — Other Ambulatory Visit: Payer: Self-pay

## 2018-07-14 ENCOUNTER — Encounter: Payer: Self-pay | Admitting: Hematology & Oncology

## 2018-07-14 ENCOUNTER — Inpatient Hospital Stay: Payer: PPO

## 2018-07-14 ENCOUNTER — Telehealth: Payer: Self-pay | Admitting: Pharmacy Technician

## 2018-07-14 ENCOUNTER — Other Ambulatory Visit: Payer: Self-pay | Admitting: *Deleted

## 2018-07-14 ENCOUNTER — Inpatient Hospital Stay (HOSPITAL_BASED_OUTPATIENT_CLINIC_OR_DEPARTMENT_OTHER): Payer: PPO | Admitting: Hematology & Oncology

## 2018-07-14 VITALS — BP 160/59 | HR 82 | Temp 97.8°F | Resp 22 | Wt 231.0 lb

## 2018-07-14 DIAGNOSIS — Z79899 Other long term (current) drug therapy: Secondary | ICD-10-CM

## 2018-07-14 DIAGNOSIS — D45 Polycythemia vera: Secondary | ICD-10-CM

## 2018-07-14 DIAGNOSIS — R600 Localized edema: Secondary | ICD-10-CM

## 2018-07-14 DIAGNOSIS — K7460 Unspecified cirrhosis of liver: Secondary | ICD-10-CM

## 2018-07-14 DIAGNOSIS — E1142 Type 2 diabetes mellitus with diabetic polyneuropathy: Secondary | ICD-10-CM

## 2018-07-14 DIAGNOSIS — R188 Other ascites: Secondary | ICD-10-CM

## 2018-07-14 LAB — CBC WITH DIFFERENTIAL (CANCER CENTER ONLY)
BASOS PCT: 2 %
Basophils Absolute: 0.3 10*3/uL — ABNORMAL HIGH (ref 0.0–0.1)
Eosinophils Absolute: 0.5 10*3/uL (ref 0.0–0.5)
Eosinophils Relative: 3 %
HEMATOCRIT: 31.1 % — AB (ref 34.8–46.6)
Hemoglobin: 9.5 g/dL — ABNORMAL LOW (ref 11.6–15.9)
LYMPHS ABS: 2.6 10*3/uL (ref 0.9–3.3)
Lymphocytes Relative: 16 %
MCH: 25.8 pg — ABNORMAL LOW (ref 26.0–34.0)
MCHC: 30.5 g/dL — ABNORMAL LOW (ref 32.0–36.0)
MCV: 84.5 fL (ref 81.0–101.0)
MYELOCYTES: 1 %
Metamyelocytes Relative: 3 %
Monocytes Absolute: 1.3 10*3/uL — ABNORMAL HIGH (ref 0.1–0.9)
Monocytes Relative: 8 %
NEUTROS ABS: 11.4 10*3/uL — AB (ref 1.5–6.5)
Neutrophils Relative %: 67 %
Platelet Count: 277 10*3/uL (ref 145–400)
RBC: 3.68 MIL/uL — ABNORMAL LOW (ref 3.70–5.32)
RDW: 24.8 % — AB (ref 11.1–15.7)
WBC Count: 16.1 10*3/uL — ABNORMAL HIGH (ref 3.9–10.0)

## 2018-07-14 LAB — CMP (CANCER CENTER ONLY)
ALT: 21 U/L (ref 10–47)
ANION GAP: 4 — AB (ref 5–15)
AST: 37 U/L (ref 11–38)
Albumin: 3.6 g/dL (ref 3.5–5.0)
Alkaline Phosphatase: 147 U/L — ABNORMAL HIGH (ref 26–84)
BILIRUBIN TOTAL: 1.1 mg/dL (ref 0.2–1.6)
BUN: 11 mg/dL (ref 7–22)
CALCIUM: 9.6 mg/dL (ref 8.0–10.3)
CHLORIDE: 112 mmol/L — AB (ref 98–108)
CO2: 27 mmol/L (ref 18–33)
Creatinine: 1.2 mg/dL (ref 0.60–1.20)
Glucose, Bld: 80 mg/dL (ref 73–118)
Potassium: 4.4 mmol/L (ref 3.3–4.7)
Sodium: 143 mmol/L (ref 128–145)
Total Protein: 6.7 g/dL (ref 6.4–8.1)

## 2018-07-14 LAB — SAVE SMEAR

## 2018-07-14 LAB — SAMPLE TO BLOOD BANK

## 2018-07-14 MED ORDER — RUXOLITINIB PHOSPHATE 25 MG PO TABS: 25.0000 mg | ORAL_TABLET | Freq: Two times a day (BID) | ORAL | 6 refills | Status: DC

## 2018-07-14 NOTE — Progress Notes (Signed)
I agree with the above note, plan 

## 2018-07-14 NOTE — Telephone Encounter (Signed)
Oral Oncology Patient Advocate Encounter   Was successful in securing patient an $41,500 grant from [facility]) to provide copayment coverage for Jakafi.  This will keep the out of pocket expense at $0.    I have spoken with the patient.    The billing information is as follows and has been shared with [place].   Member ID: [id number] Group ID: [id number] RxBin: [id number] Dates of Eligibility: 07/14/18 through 07/14/2019  [place] Patient [place] Phone [phone] Fax [phone] 07/14/2018 3:07 PM

## 2018-07-14 NOTE — Telephone Encounter (Signed)
Oral Oncology Patient Advocate Encounter  Was successful in securing patient a $6,500 grant from [employer] to provide copayment coverage for [facility].  The patient's out of pocket cost will be $10 monthly.  PANF will pick up copay.    The billing information is as follows and has been shared with [place].   Member ID: [id number] Group ID: [id number] RxBin: [id number] PCN: [id number] Dates of Eligibility: 07/14/18 through 10/17/18  [place] Patient [place] Phone [phone] Fax [phone] 07/14/2018 3:52 PM

## 2018-07-14 NOTE — Progress Notes (Signed)
Hematology and Oncology Follow Up Visit  Amanda [name] [id number] May 20, 1941 77 y.o.  07/14/2018   Principle Diagnosis:  [name] - JAK2 (+)  Current Therapy:   Jakafi 25 mg po BID   Interim History:  Amanda [name] is here today with her husband for follow-up.  She actually looks a whole lot better than when I last saw her.  She is at home now.  She seems to be doing okay although she has a lot of edema in her legs.  She had been on Lasix alone.  Now she has been added Aldactone.  She has not yet started this.  She is doing well with the Jakafi.  She is tolerated Jakafi without problems in the past.  Her appetite is good.  She is trying to watch what she eats.  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  She says she is going to need to be careful.  Her blood sugars are doing a fantastic.  Her hemoglobin is 80 today.  There is been no nausea or vomiting.  She is had no bleeding.  Only last saw her, her ferritin was 109 with an iron saturation of 34%.  There is been no cough.  She gets around with a rolling walker.  She really hates this.    I would have to say that her performance status is ECOG 2.  Medications:  Allergies as of 07/14/2018      Reactions   Penicillins Swelling   Has patient had a PCN reaction causing immediate rash, facial/tongue/throat swelling, SOB or lightheadedness with hypotension: No Has patient had a PCN reaction causing severe rash involving mucus membranes or skin necrosis: No Has patient had a PCN reaction that required hospitalization: No Has patient had a PCN reaction occurring within the last 10 years: No If all of the above answers are "NO", then may proceed with Cephalosporin use.   Lisinopril Cough   Tape Hives   Doxycycline Hives, Swelling, Rash   Latex Hives, Itching, Rash      Medication List        Accurate as of 07/14/18 12:56 PM. Always use your most recent med list.            ACCU-CHEK GUIDE w/Device Kit 1 kit by Does not apply route as directed.   acetaminophen 325 MG tablet Commonly known as:  TYLENOL Take 2 tablets (650 mg total) by mouth every 6 (six) hours as needed for mild pain (or Fever >/= 101).   amLODipine 10 MG tablet Commonly known as:  NORVASC Take 1 tablet (10 mg total) by mouth daily.   diphenoxylate-atropine 2.5-0.025 MG tablet Commonly known as:  LOMOTIL Take 1-2 tablets by mouth 2 (two) times daily as needed for diarrhea or loose stools.   Dulaglutide 1.5 MG/0.5ML Sopn Inject 1.5 mg into the skin once a week.   furosemide 40 MG tablet Commonly known as:  LASIX Take 1 tablet ( 40 mg)  By mouth every morning.   gabapentin 100 MG capsule Commonly known as:  NEURONTIN Take 1 capsule (100 mg total) by mouth 2 (two) times daily.   glucose blood test strip Use to check blood sugars 3 times daily   ONETOUCH VERIO test strip Generic drug:  glucose blood USE TO TEST BLOOD SUGAR THREE TIMES DAILY   insulin aspart 100 UNIT/ML injection Commonly known as:  novoLOG Inject 0-9 Units into the skin 3 (three) times daily  with meals.   Insulin Pen Needle 32G X 4 MM Misc USE TO INJECT INSULIN AS DIRECTED.   JAKAFI 25 MG tablet Generic drug:  ruxolitinib phosphate Take 25 mg by mouth 2 (two) times daily.   latanoprost 0.005 % ophthalmic solution Commonly known as:  XALATAN   lipase/protease/amylase 36000 UNITS Cpep capsule Commonly known as:  CREON Take 1 capsule (36,000 Units total) by mouth 3 (three) times daily with meals.   loperamide 2 MG tablet Commonly known as:  IMODIUM A-D Take 1-2 tablets (2-4 mg total) by mouth daily as needed for diarrhea or loose stools.   metoprolol tartrate 25 MG tablet Commonly known as:  LOPRESSOR Take 0.5 tablets (12.5 mg total) by mouth 2 (two) times daily.   mirtazapine 7.5 MG tablet Commonly known as:  REMERON Take 1 tablet (7.5 mg total) by mouth at bedtime.   ondansetron 4 MG  disintegrating tablet Commonly known as:  ZOFRAN-ODT Take 1 tablet (4 mg total) by mouth every 8 (eight) hours as needed.   promethazine 25 MG tablet Commonly known as:  PHENERGAN Take 0.5 tablets (12.5 mg total) by mouth every 6 (six) hours as needed for nausea or vomiting.   rosuvastatin 20 MG tablet Commonly known as:  CRESTOR TAKE 1 TABLET(20 MG) BY MOUTH DAILY   spironolactone 50 MG tablet Commonly known as:  ALDACTONE Take 2 tablets (100 mg total) by mouth daily.   triamcinolone ointment 0.5 % Commonly known as:  KENALOG Apply 1 application topically 2 (two) times daily.       Allergies:  Allergies  Allergen Reactions  . Penicillins Swelling    Has patient had a PCN reaction causing immediate rash, facial/tongue/throat swelling, SOB or lightheadedness with hypotension: No Has patient had a PCN reaction causing severe rash involving mucus membranes or skin necrosis: No Has patient had a PCN reaction that required hospitalization: No Has patient had a PCN reaction occurring within the last 10 years: No If all of the above answers are "NO", then may proceed with Cephalosporin use.  Marland Kitchen Lisinopril Cough  . Tape Hives  . Doxycycline Hives, Swelling and Rash  . Latex Hives, Itching and Rash    Past Medical History, Surgical history, Social history, and Family History were reviewed and updated.  Review of Systems: Review of Systems  Constitutional: Negative.   HENT: Negative.  Negative for hearing loss.   Eyes: Negative.   Respiratory: Positive for shortness of breath.   Cardiovascular: Positive for leg swelling.  Gastrointestinal: Negative.   Genitourinary: Positive for frequency.  Musculoskeletal: Positive for joint pain and myalgias.  Skin: Negative.   Neurological: Negative.   Endo/Heme/Allergies: Negative.   Psychiatric/Behavioral: Negative.       Physical Exam:  weight is 231 lb (104.8 kg). Her oral temperature is 97.8 F (36.6 C). Her blood pressure is  160/59 (abnormal) and her pulse is 82. Her respiration is 22 (abnormal) and oxygen saturation is 100%.   Wt Readings from Last 3 Encounters:  07/14/18 231 lb (104.8 kg)  07/13/18 233 lb (105.7 kg)  07/05/18 229 lb (103.9 kg)    Physical Exam  Constitutional: She is oriented to person, place, and time.  HENT:  Head: Normocephalic and atraumatic.  Mouth/Throat: Oropharynx is clear and moist.  Eyes: Pupils are equal, round, and reactive to light. EOM are normal.  Neck: Normal range of motion.  Cardiovascular: Normal rate, regular rhythm and normal heart sounds.  Pulmonary/Chest: Effort normal and breath sounds normal.  Lungs show  some decreased breath sounds throughout both lung fields.  She has good breath sounds bilaterally.  Abdominal: Soft. Bowel sounds are normal.  Abdominal exam shows a somewhat obese abdomen.  There is no obvious fluid wave.  I cannot palpate a liver or spleen tip.  Musculoskeletal: Normal range of motion. She exhibits no edema, tenderness or deformity.  Extremities shows 2+ edema in her legs.  Lymphadenopathy:    She has no cervical adenopathy.  Neurological: She is alert and oriented to person, place, and time.  Skin: Skin is warm and dry. No rash noted. No erythema.  Psychiatric: She has a normal mood and affect. Her behavior is normal. Judgment and thought content normal.  Vitals reviewed.    Lab Results  Component Value Date   WBC 16.1 (H) 07/14/2018   HGB 9.5 (L) 07/14/2018   HCT 31.1 (L) 07/14/2018   MCV 84.5 07/14/2018   PLT 277 07/14/2018   Lab Results  Component Value Date   FERRITIN 109 06/23/2018   IRON 111 06/23/2018   TIBC 329 06/23/2018   UIBC 217 06/23/2018   IRONPCTSAT 34 06/23/2018   Lab Results  Component Value Date   RETICCTPCT 2.4 01/30/2014   RBC 3.68 (L) 07/14/2018   No results found for: Nils Pyle [facility] Lab Results  Component Value Date   IGA 233 03/29/2014   No results found for: Kathrynn Ducking, MSPIKE, SPEI   Chemistry      Component Value Date/Time   NA 143 07/14/2018 1131   NA 140 10/12/2017 1013   NA 138 01/21/2017 0806   K 4.4 07/14/2018 1131   K 3.7 10/12/2017 1013   K 4.1 01/21/2017 0806   CL 112 (H) 07/14/2018 1131   CL 103 10/12/2017 1013   CL 103 02/16/2013 0900   CO2 27 07/14/2018 1131   CO2 25 10/12/2017 1013   CO2 21 (L) 01/21/2017 0806   BUN 11 07/14/2018 1131   BUN 10 10/12/2017 1013   BUN 12.2 01/21/2017 0806   CREATININE 1.20 07/14/2018 1131   CREATININE 0.9 10/12/2017 1013   CREATININE 1.0 01/21/2017 0806      Component Value Date/Time   CALCIUM 9.6 07/14/2018 1131   CALCIUM 8.7 10/12/2017 1013   CALCIUM 9.3 01/21/2017 0806   ALKPHOS 147 (H) 07/14/2018 1131   ALKPHOS 97 (H) 10/12/2017 1013   ALKPHOS 112 01/21/2017 0806   AST 37 07/14/2018 1131   AST 37 (H) 01/21/2017 0806   ALT 21 07/14/2018 1131   ALT 25 10/12/2017 1013   ALT 16 01/21/2017 0806   BILITOT 1.1 07/14/2018 1131   BILITOT 0.89 01/21/2017 0806      Impression and Plan: Ms. Naron is a very pleasant 77 yo caucasian female with polycythemia vera, JAK-2 positive.   Hopefully, she will continue to do okay.  I think that we do not have to transfuse her right now.  I think transfusions again be our only option she does become anemic given the fact that she has had a CVA.  I will plan to see her back in about 6 weeks.  Hopefully, we will see continued physical improvement.  I told her that if she still has edema in her legs despite the addition of Aldactone, then I would probably consider Zaroxolyn.      Volanda Napoleon, MD 9/27/201912:56 PM

## 2018-07-15 LAB — ERYTHROPOIETIN: ERYTHROPOIETIN: 33.2 m[IU]/mL — AB (ref 2.6–18.5)

## 2018-07-17 ENCOUNTER — Telehealth: Payer: Self-pay | Admitting: Pharmacist

## 2018-07-17 DIAGNOSIS — D45 Polycythemia vera: Secondary | ICD-10-CM

## 2018-07-17 LAB — IRON AND TIBC
IRON: 40 ug/dL — AB (ref 41–142)
Saturation Ratios: 12 % — ABNORMAL LOW (ref 21–57)
TIBC: 346 ug/dL (ref 236–444)
UIBC: 306 ug/dL

## 2018-07-17 LAB — FERRITIN: Ferritin: 54 ng/mL (ref 11–307)

## 2018-07-17 NOTE — Telephone Encounter (Signed)
Oral Oncology Pharmacist Encounter  Received refill prescription for Jakafi (ruxolitinib) for the treatment of polycythemia vera, JAK-2 positive, planned duration until disease progression or unacceptable drug toxicity. Medication started in 2016.  CBC/CMP from 07/14/18 assessed, continue to monitor Hgb and consider dose reducing for further drops in hgb.  Current medication list in Epic reviewed, one DDIs with Jakafi identified: - Ruxolitinib may enhance the bradycardic effect of metoprolol. Monitor pt's HR. This is not a new interaction, HR stable and wnl.  Prescription has been e-scribed to the [facility] and we will see if the insurance company will let it be filled there.  Oral Oncology Clinic will continue to follow patient.   Darl Pikes, PharmD, BCPS, [facility] Hematology/Oncology Clinical Pharmacist [facility] [phone]  07/17/2018 11:34 AM

## 2018-07-18 ENCOUNTER — Telehealth: Payer: Self-pay | Admitting: Physician Assistant

## 2018-07-18 ENCOUNTER — Telehealth: Payer: Self-pay | Admitting: Pharmacy Technician

## 2018-07-18 MED FILL — JAKAFI 25 MG TABLET: 25 | 30 days supply | Qty: 60 | Fill #0

## 2018-07-18 NOTE — Telephone Encounter (Signed)
Oral Chemotherapy Pharmacist Encounter  I spoke with patient for overview of refill prescription for Jakafi (ruxolitinib) for the treatment of polycythemia vera, JAK-2 positive, planned duration until disease progression or unacceptable drug toxicity. Medication started in 2016.   Pt was previously filling at an outside pharmacy but the medication is now being filled at [facility]  Reviewed administration, dosing, side effects, monitoring, drug-food interactions, safe handling, storage, and disposal. Patient will take 1 tablet (25 mg total) by mouth 2 (two) times daily.   Side effects include but not limited to: decreased hgb/plt, dizziness, HA  She is not currently experiencing any side effects.   Reviewed with patient importance of keeping a medication schedule and plan for any missed doses.   Ms. Cowles voiced understanding and appreciation. All questions answered.   Patient knows to call the office with questions or concerns. Oral Oncology Clinic will continue to follow.   Thank you,  Darl Pikes, PharmD, BCPS, [facility] Clinical Pharmacist [facility] [facility] [phone]  07/18/2018 9:24 AM

## 2018-07-18 NOTE — Telephone Encounter (Signed)
Left message that it is a week too early and to please wait until next week.

## 2018-07-18 NOTE — Telephone Encounter (Signed)
Oral Oncology Patient Advocate Encounter  I spoke with Amanda [name] this morning to set up delivery of there Amanda [name].  Address verified for shipment.  Amanda [name] will be filled through [facility] and mailed 10/1 for delivery 10/2.    Amanda [name] will call her 1 week before she is due for a refill to set up delivery of her medication.  She is familiar with this process and had no question.  Amanda [name] funding will cover her copay.   [name] Phone [phone] Fax [phone] 07/18/2018 9:42 AM

## 2018-07-19 ENCOUNTER — Ambulatory Visit (INDEPENDENT_AMBULATORY_CARE_PROVIDER_SITE_OTHER): Payer: PPO | Admitting: Internal Medicine

## 2018-07-19 ENCOUNTER — Encounter: Payer: Self-pay | Admitting: Internal Medicine

## 2018-07-19 VITALS — BP 140/70 | HR 87 | Ht 63.0 in | Wt 228.0 lb

## 2018-07-19 DIAGNOSIS — E1142 Type 2 diabetes mellitus with diabetic polyneuropathy: Secondary | ICD-10-CM | POA: Diagnosis not present

## 2018-07-19 DIAGNOSIS — E669 Obesity, unspecified: Secondary | ICD-10-CM | POA: Diagnosis not present

## 2018-07-19 DIAGNOSIS — Z794 Long term (current) use of insulin: Secondary | ICD-10-CM

## 2018-07-19 NOTE — Progress Notes (Signed)
Patient ID: Amanda [name], female   DOB: April 29, 1941, 77 y.o.   MRN: [id number]  HPI: Amanda [name] is a 77 y.o.-year-old female, returning for f/u for DM2, dx 2008, insulin-dependent since dx, uncontrolled, with complications (DR, CKD, PN, CAD-s/p AMI 01/2015, s/p 2 stents, cerebrovascular disease-s/p stroke 03/2018). Last visit 79 weeks ago.  She is here with her husband who offers part of the history, especially regarding her medication doses and sugars at home.    Of note, she has polycythemia vera and sees Dr. [name].  Since last visit, especially in the last month, her sugars have improved.  Unfortunately, they do not bring the records of the CBGs in the last month, just the average, 140.  Last hemoglobin A1c was: 03/17/2018: HbA1c calculated from fructosamine is the best in a long while, at 6.35%. 08/30/2017: HbA1c calculated from fructosamine: 7.7% 05/06/2017: HbA1c calculated from the fructosamine appears better, at 6.7%. 02/11/2016: HbA1c from  fructosamine is 7.5% Lab Results  Component Value Date   HGBA1C 7.9 (H) 03/23/2018   HGBA1C 8.4 (H) 01/10/2018   HGBA1C 9.6 10/28/2015  07/01/2015: HbA1c from fructosamine 6.57%. 01/27/2015: HbA1c from fructosamine: 6.57% 05/16/2014: HbA1c from fructosamine: 7.95%. - in 10/16/2012, hemoglobin A1c was 6.9% - in 07/2012, hemoglobin A1c was 9.9% - in 11/16/2010, hemoglobin A1c was 12.8% - in 03/2011, hemoglobin A1c was 6.9% - In 07/10/2010 her hemoglobin A1c was 10.5% C-peptide was 6.3 and 01/2011.   She is currently on: - U500 100 >> 80 units 2x a day - Trulicity 1.5 mg weekly  Stopped Invokana 100 mg in the past b/c of negative publicity.  She could not tolerate doses of metformin XR higher than 250 mg twice a day in the past due to diarrhea. She does not want to resume this. Was previously on the Victoza 1.8 mg daily - "made me sick". She was previously on Janumet between 2012-2013.  She checks sugars 4 times a day - did not bring  values for last month (ave for last 2 weeks: 140), but reviewed the ones from the SNF from up to 06/14/2018: - am:233, 291 >> 170-240 >> 121 >> 135-158 - 2h after b'fast: 238 >> n/c >> 167 >> n/c - before lunch: n/c >> 170-240 >> n/c - 2h after lunch:  n/c >> 379, 406 >> n/c - before dinner:  241 >> 140-210 >> 86 - bedtime: 180-220 >> n/c >> 200-305 >> n/c >> 143-267 Lowest sugar was 23 in the hospital (?!) >> ...86 >> 67 x1, 80s; she has hypoglycemia awareness around the 100. Highest 452 >> 270 >> 121 >> 267 but in last month: only 2 values >200, OTW 160  She was seen for nutritional advice and diabetes education, with last visit on 04/2012 - [name]..  -+ CKD, last BUN/creatinine:  Lab Results  Component Value Date   BUN 11 07/14/2018   CREATININE 1.20 07/14/2018  On Cozaar.  + HL; latest Lipid panel: Lab Results  Component Value Date   CHOL 167 03/23/2018   HDL 25 (L) 03/23/2018   LDLCALC 93 03/23/2018   LDLDIRECT 103.0 02/14/2017   TRIG 247 (H) 03/23/2018   CHOLHDL 6.7 03/23/2018  On pravastatin. - last eye exam was in this Summer: + DR.  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  She has cataracts >> will have cataract Sx soon.+ Glaucoma. -+ Numbness tingling (peripheral neuropathy>> saw PCP, podiatrist, orthopedist >> tried capsaicin cream, compression stockings, now on Neurontin and compounded neuropathy cream. On ASA  81.  She also has a history of HTN, diastolic dysfunction, polycytemia vera, gout, psoriasis, depression, history of Bell's palsy, obesity, vitamin D deficiency.  On 01/27/2015 she had an AMI >> had 2 stents placed then. She was admitted to the hospital on 03/22/2018 for stroke - per review of her MRI report: Punctate small vessel type acute ischemic nonhemorrhagic infarct involving the posterior right centrum semi ovale.  60  ROS: Constitutional: + weight gain/+ weight loss, no fatigue, no subjective hyperthermia, no subjective hypothermia Eyes: no  blurry vision, no xerophthalmia ENT: no sore throat, no nodules palpated in throat, no dysphagia, no odynophagia, no hoarseness Cardiovascular: no CP/+ SOB/no palpitations/+ leg swelling Respiratory: no cough/+ SOB/no wheezing Gastrointestinal: no N/no V/+ D/no C/no acid reflux Musculoskeletal: no muscle aches/no joint aches Skin: no rashes, no hair loss Neurological: no tremors/no numbness/no tingling/no dizziness  I reviewed pt's medications, allergies, PMH, social hx, family hx, and changes were documented in the history of present illness. Otherwise, unchanged from my initial visit note.  Past Medical History:  Diagnosis Date  . Allergic rhinitis 01/23/2016  . C. difficile colitis   . CAD (coronary artery disease)    a. s/p STEMI in 01/2015 with 95% LCx stenosis and distal 80% LCx stenosis (DESx2 placed)  . Cancer ([place])    SKIN  . Candidiasis of skin 09/30/2014  . Cirrhosis ([place])   . Depression   . Diabetes mellitus 2008  . Gout   . Herpes simplex   . Hyperglycemia 05/31/2013  . Hyperlipidemia   . Hypertension   . Myocardial infarction ([place])   . Neuromuscular disorder ([place])    BELL PALSY  . Obesity   . Polycythemia    Dr. Elease Hashimoto- [facility]  . Psoriasis    Past Surgical History:  Procedure Laterality Date  . BIOPSY  05/26/2018   Procedure: BIOPSY;  Surgeon: [name], MD;  Location: [facility] ENDOSCOPY;  Service: Endoscopy;;  . BREAST SURGERY Left    milk duct  . CATARACT EXTRACTION, BILATERAL    . COLONOSCOPY    . ESOPHAGOGASTRODUODENOSCOPY N/A 05/26/2018   Procedure: ESOPHAGOGASTRODUODENOSCOPY (EGD);  Surgeon: [name], MD;  Location[facility] ENDOSCOPY;  Service: Endoscopy;  Laterality: N/A;  . IR PARACENTESIS  05/25/2018  . LEFT HEART CATHETERIZATION WITH CORONARY ANGIOGRAM N/A 01/27/2015   Procedure: LEFT HEART CATHETERIZATION WITH CORONARY ANGIOGRAM;  Surgeon: [name], MD;  Location: [facility] CATH LAB;  Service: Cardiovascular;  Laterality: N/A;  . TONSILLECTOMY     . TOTAL ABDOMINAL HYSTERECTOMY W/ BILATERAL SALPINGOOPHORECTOMY     for heavy periods with appendectomy   Social History   Socioeconomic History  . Marital status: Married    Spouse name: Not on file  . Number of children: 2  . Years of education: 2 yr colle  . Highest education level: Not on file  Occupational History  . Occupation: housewife  Social Needs  . Financial resource strain: Not on file  . Food insecurity:    Worry: Not on file    Inability: Not on file  . Transportation needs:    Medical: Not on file    Non-medical: Not on file  Tobacco Use  . Smoking status: Former Smoker    Years: 48.00    Types: Cigarettes    Last attempt to quit: 10/18/1986    Years since quitting: 31.7  . Smokeless tobacco: Never Used  Substance and Sexual Activity  . Alcohol use: No    Alcohol/week: 0.0 standard drinks  . Drug  use: No  . Sexual activity: Yes    Partners: Male    Birth control/protection: None  Lifestyle  . Physical activity:    Days per week: Not on file    Minutes per session: Not on file  . Stress: Not on file  Relationships  . Social connections:    Talks on phone: Not on file    Gets together: Not on file    Attends religious service: Not on file    Active member of club or organization: Not on file    Attends meetings of clubs or organizations: Not on file    Relationship status: Not on file  . Intimate partner violence:    Fear of current or ex partner: Not on file    Emotionally abused: Not on file    Physically abused: Not on file    Forced sexual activity: Not on file  Other Topics Concern  . Not on file  Social History Narrative   2 caffeine drink per day.  No regular exercise.     Retired from the bank   2 children (Daughter and a son) son has morbid obesity   2 grandchildren   3 great grandchildren   Current Outpatient Medications on File Prior to Visit  Medication Sig Dispense Refill  . acetaminophen (TYLENOL) 325 MG tablet Take 2 tablets  (650 mg total) by mouth every 6 (six) hours as needed for mild pain (or Fever >/= 101).    Marland Kitchen amLODipine (NORVASC) 10 MG tablet Take 1 tablet (10 mg total) by mouth daily. 90 tablet 1  . Blood Glucose Monitoring Suppl (ACCU-CHEK GUIDE) w/Device KIT 1 kit by Does not apply route as directed. 1 kit 0  . diphenoxylate-atropine (LOMOTIL) 2.5-0.025 MG tablet Take 1-2 tablets by mouth 2 (two) times daily as needed for diarrhea or loose stools. 60 tablet 0  . Dulaglutide (TRULICITY) 1.5 DU/2.0UR SOPN Inject 1.5 mg into the skin once a week. 4 pen 1  . furosemide (LASIX) 40 MG tablet Take 1 tablet ( 40 mg)  By mouth every morning. 30 tablet 3  . gabapentin (NEURONTIN) 100 MG capsule Take 1 capsule (100 mg total) by mouth 2 (two) times daily. (Patient not taking: Reported on 07/13/2018) 60 capsule 0  . glucose blood (ACCU-CHEK GUIDE) test strip Use to check blood sugars 3 times daily 100 each 12  . insulin aspart (NOVOLOG) 100 UNIT/ML injection Inject 0-9 Units into the skin 3 (three) times daily with meals. 10 mL 11  . Insulin Pen Needle (BD PEN NEEDLE NANO U/F) 32G X 4 MM MISC USE TO INJECT INSULIN AS DIRECTED. 100 each 0  . latanoprost (XALATAN) 0.005 % ophthalmic solution     . lipase/protease/amylase (CREON) 36000 UNITS CPEP capsule Take 1 capsule (36,000 Units total) by mouth 3 (three) times daily with meals. 180 capsule 3  . loperamide (IMODIUM A-D) 2 MG tablet Take 1-2 tablets (2-4 mg total) by mouth daily as needed for diarrhea or loose stools. 30 tablet 1  . metoprolol tartrate (LOPRESSOR) 25 MG tablet Take 0.5 tablets (12.5 mg total) by mouth 2 (two) times daily. 60 tablet 0  . mirtazapine (REMERON) 7.5 MG tablet Take 1 tablet (7.5 mg total) by mouth at bedtime. 30 tablet 1  . ondansetron (ZOFRAN ODT) 4 MG disintegrating tablet Take 1 tablet (4 mg total) by mouth every 8 (eight) hours as needed. (Patient taking differently: Take 4 mg by mouth every 8 (eight) hours as needed for nausea or  vomiting. )  10 tablet 0  . ONETOUCH VERIO test strip USE TO TEST BLOOD SUGAR THREE TIMES DAILY 300 each 2  . promethazine (PHENERGAN) 25 MG tablet Take 0.5 tablets (12.5 mg total) by mouth every 6 (six) hours as needed for nausea or vomiting. 30 tablet 1  . rosuvastatin (CRESTOR) 20 MG tablet TAKE 1 TABLET(20 MG) BY MOUTH DAILY 90 tablet 3  . ruxolitinib phosphate (JAKAFI) 25 MG tablet Take 1 tablet (25 mg total) by mouth 2 (two) times daily. 60 tablet 6  . spironolactone (ALDACTONE) 50 MG tablet Take 2 tablets (100 mg total) by mouth daily. 60 tablet 6  . triamcinolone ointment (KENALOG) 0.5 % Apply 1 application topically 2 (two) times daily. 30 g 0   No current facility-administered medications on file prior to visit.    Allergies  Allergen Reactions  . Penicillins Swelling    Has patient had a PCN reaction causing immediate rash, facial/tongue/throat swelling, SOB or lightheadedness with hypotension: No Has patient had a PCN reaction causing severe rash involving mucus membranes or skin necrosis: No Has patient had a PCN reaction that required hospitalization: No Has patient had a PCN reaction occurring within the last 10 years: No If all of the above answers are "NO", then may proceed with Cephalosporin use.  Marland Kitchen Lisinopril Cough  . Tape Hives  . Doxycycline Hives, Swelling and Rash  . Latex Hives, Itching and Rash   Family History  Problem Relation Age of Onset  . Diabetes Mother   . Heart disease Mother        CAD  . Hyperlipidemia Mother   . Hypertension Mother   . Kidney disease Mother   . Kidney disease Father   . Breast cancer Maternal Aunt   . Heart disease Maternal Grandmother   . Heart disease Other        maternal aunts and uncles    PE: BP 140/70   Pulse 87   Ht 5' 3"  (1.6 m)   Wt 228 lb (103.4 kg)   SpO2 92%   BMI 40.39 kg/m  Body mass index is 40.39 kg/m. Wt Readings from Last 3 Encounters:  07/19/18 228 lb (103.4 kg)  07/14/18 231 lb (104.8 kg)  07/13/18 233 lb  (105.7 kg)   Constitutional: overweight, in NAD Eyes: PERRLA, EOMI, no exophthalmos ENT: moist mucous membranes, no thyromegaly, no cervical lymphadenopathy Cardiovascular: RRR, No MRG, + LE swelling - pitting B Respiratory: CTA B Gastrointestinal: abdomen soft, NT, ND, BS+ Musculoskeletal: no deformities, strength intact in all 4 Skin: moist, warm, no rashes Neurological: no tremor with outstretched hands, DTR normal in all 4  ASSESSMENT: 1. DM2, insulin-dependent, uncontrolled, with complications - nonprolif. DR - CKD - peripheral neuropathy - CAD - s/p AMI s/p 2 stents 01/2015 - Cerebrovascular disease - s/p stroke 03/2018  2. Diabetic peripheral neuropathy  3.  Obesity  PLAN:  1. Patient with long-standing, uncontrolled, type 2 diabetes, very insulin resistant, on U500 insulin now and also Trulicity.  At last visit, we reduce the doses of her U500 insulin in preparation for starting Trulicity.  She is getting both medications through the patient assistant program. -Reviewed record log from 06-05/2018, and the sugars were close to goal in the morning but higher in the evening.  However, in the last month, they mentioned that her sugars have improved and they bring the average sugar for the last 2 weeks downloaded from her meter.  This is 140.  However, I do not  have the values since they forgot them. Based on her average, it appears that her sugars are controlled at home and they do not report significant lows or highs.  Therefore, I advised him to continue the current doses of concentrated insulin and Trulicity.  She has no side effects from Trulicity and is really happy about how this improved her blood sugars. - I advised her to: Patient Instructions  Please continue: - U500 insulin 80 units before breakfast and dinner - Trulicity 1.5 mg once a week  Please return in 4 months with your sugar log.   - HbA1c levels are not accurate for her due to polycythemia vera.  We will  check a fructosamine today. - continue checking sugars at different times of the day - check 3-4 x a day, rotating checks - advised for yearly eye exams >> she is UTD - She already had the flu shot for this season - Return to clinic in 4 mo with sugar log     2. Diabetic peripheral neuropathy -Stable -Continues Neurontin 600 mg twice a day -Also continues neuropathy cream: Amitriptyline 2%, lidocaine 2%, and ketamine 1%- uses this as needed  3. Obesity -Trulicity helps with weight loss, also -She appears now fluid overloaded, but was able to lose about 5 pounds in the last week.  She is using Lasix as needed.  [name], MD PhD [facility] Endocrinology

## 2018-07-19 NOTE — Patient Instructions (Addendum)
Please continue: - U500 insulin 80 units 2x a day - Trulicity 1.5 mg weekly  Please return in 4 months with your sugar log.

## 2018-07-20 NOTE — Telephone Encounter (Signed)
Spoke to patients husband, [name], today and confirmed patient did receive medication yesterday 07/19/18.

## 2018-07-25 ENCOUNTER — Other Ambulatory Visit (INDEPENDENT_AMBULATORY_CARE_PROVIDER_SITE_OTHER): Payer: PPO

## 2018-07-25 DIAGNOSIS — R609 Edema, unspecified: Secondary | ICD-10-CM

## 2018-07-25 DIAGNOSIS — R188 Other ascites: Secondary | ICD-10-CM

## 2018-07-25 DIAGNOSIS — K7290 Hepatic failure, unspecified without coma: Secondary | ICD-10-CM | POA: Diagnosis not present

## 2018-07-25 LAB — BASIC METABOLIC PANEL
BUN: 18 mg/dL (ref 6–23)
CHLORIDE: 105 meq/L (ref 96–112)
CO2: 25 mEq/L (ref 19–32)
Calcium: 9 mg/dL (ref 8.4–10.5)
Creatinine, Ser: 1.06 mg/dL (ref 0.40–1.20)
GFR: 53.16 mL/min — ABNORMAL LOW (ref >60.00)
Glucose, Bld: 266 mg/dL — ABNORMAL HIGH (ref 70–99)
POTASSIUM: 4 meq/L (ref 3.5–5.1)
Sodium: 140 mEq/L (ref 135–145)

## 2018-07-27 ENCOUNTER — Ambulatory Visit (INDEPENDENT_AMBULATORY_CARE_PROVIDER_SITE_OTHER): Payer: PPO | Admitting: Physician Assistant

## 2018-07-27 ENCOUNTER — Telehealth: Payer: Self-pay | Admitting: Physician Assistant

## 2018-07-27 ENCOUNTER — Encounter: Payer: Self-pay | Admitting: Physician Assistant

## 2018-07-27 VITALS — BP 140/58 | HR 72 | Ht 64.5 in | Wt 226.2 lb

## 2018-07-27 DIAGNOSIS — R609 Edema, unspecified: Secondary | ICD-10-CM | POA: Diagnosis not present

## 2018-07-27 DIAGNOSIS — R188 Other ascites: Secondary | ICD-10-CM | POA: Diagnosis not present

## 2018-07-27 DIAGNOSIS — K7290 Hepatic failure, unspecified without coma: Secondary | ICD-10-CM

## 2018-07-27 NOTE — Patient Instructions (Addendum)
Increase the Lasix to 1 1/2 tablets each ( Total of 60 mg each morning. ) Continue Aldactone 50 mg ( 2 pills each morning).   Stay off salt.  Keep walking.   Come for labs on Monday 10-28.  Follow up with Nicoletta Ba PA on 08-15-2018 at 3:00 PM.   Normal BMI (Body Mass Index- based on height and weight) is between 23 and 30. Your BMI today is Body mass index is 38.24 kg/m. Marland Kitchen Please consider follow up  regarding your BMI with your Primary Care Provider.

## 2018-07-27 NOTE — Progress Notes (Signed)
Subjective:    Patient ID: Amanda [name], female    DOB: [birth year], 77 y.o.   MRN: [id number]  HPI Amanda [name] is a very nice 77 year old white female, known to Dr. [name] and myself.  She has decompensated cirrhosis secondary to Karlene Lineman which is been complicated by ascites and peripheral edema. She had recent hospitalization in August with profound anemia with hemoglobin of 6.2 and at EGD was found to have gastritis and duodenitis, no varices.  During that admission she had paracentesis with removal of 2.7 L. She was seen in the office by myself on 07/13/2018 with increase in abdominal girth and discomfort as well as increased peripheral edema which was making it difficult for her to walk.  Her weight was up to 233, she had been to 14 in April. Lasix was increased to 40 mg p.o. daily, Aldactone continued at 100 mg p.o. every morning. She comes back in today for follow-up and says she is definitely feeling better.  She has had a decrease in peripheral edema and says she is able to walk more comfortably.  She is following a 2 g sodium diet.  She also feels that her abdomen is smaller, she denies any pain and says this is not causing her any shortness of breath. Labs were done on 07/25/2018 glucose 266 BUN 18 creatinine 1.06.  Other problems include history of CVA, hypertension, coronary artery disease status post MI, diastolic heart failure with EF 60%, peripheral neuropathy, adult onset diabetes mellitus and polycythemia vera  Review of Systems Pertinent positive and negative review of systems were noted in the above HPI section.  All other review of systems was otherwise negative.  Outpatient Encounter Medications as of 07/27/2018  Medication Sig  . acetaminophen (TYLENOL) 325 MG tablet Take 2 tablets (650 mg total) by mouth every 6 (six) hours as needed for mild pain (or Fever >/= 101).  Marland Kitchen amLODipine (NORVASC) 10 MG tablet Take 1 tablet (10 mg total) by mouth daily.  . Blood Glucose Monitoring Suppl  (ACCU-CHEK GUIDE) w/Device KIT 1 kit by Does not apply route as directed.  . diphenoxylate-atropine (LOMOTIL) 2.5-0.025 MG tablet Take 1-2 tablets by mouth 2 (two) times daily as needed for diarrhea or loose stools.  . Dulaglutide (TRULICITY) 1.5 ZJ/6.7HA SOPN Inject 1.5 mg into the skin once a week.  . furosemide (LASIX) 40 MG tablet Take 1 tablet ( 40 mg)  By mouth every morning.  . gabapentin (NEURONTIN) 100 MG capsule Take 1 capsule (100 mg total) by mouth 2 (two) times daily.  Marland Kitchen glucose blood (ACCU-CHEK GUIDE) test strip Use to check blood sugars 3 times daily  . insulin aspart (NOVOLOG) 100 UNIT/ML injection Inject 0-9 Units into the skin 3 (three) times daily with meals.  . Insulin Pen Needle (BD PEN NEEDLE NANO U/F) 32G X 4 MM MISC USE TO INJECT INSULIN AS DIRECTED.  Marland Kitchen latanoprost (XALATAN) 0.005 % ophthalmic solution   . lipase/protease/amylase (CREON) 36000 UNITS CPEP capsule Take 1 capsule (36,000 Units total) by mouth 3 (three) times daily with meals.  Marland Kitchen loperamide (IMODIUM A-D) 2 MG tablet Take 1-2 tablets (2-4 mg total) by mouth daily as needed for diarrhea or loose stools.  . metoprolol tartrate (LOPRESSOR) 25 MG tablet Take 0.5 tablets (12.5 mg total) by mouth 2 (two) times daily.  . mirtazapine (REMERON) 7.5 MG tablet Take 1 tablet (7.5 mg total) by mouth at bedtime.  . ondansetron (ZOFRAN ODT) 4 MG disintegrating tablet Take 1 tablet (4 mg total)  by mouth every 8 (eight) hours as needed. (Patient taking differently: Take 4 mg by mouth every 8 (eight) hours as needed for nausea or vomiting. )  . ONETOUCH VERIO test strip USE TO TEST BLOOD SUGAR THREE TIMES DAILY  . promethazine (PHENERGAN) 25 MG tablet Take 0.5 tablets (12.5 mg total) by mouth every 6 (six) hours as needed for nausea or vomiting.  . rosuvastatin (CRESTOR) 20 MG tablet TAKE 1 TABLET(20 MG) BY MOUTH DAILY  . ruxolitinib phosphate (JAKAFI) 25 MG tablet Take 1 tablet (25 mg total) by mouth 2 (two) times daily.  Marland Kitchen  spironolactone (ALDACTONE) 50 MG tablet Take 2 tablets (100 mg total) by mouth daily.  Marland Kitchen triamcinolone ointment (KENALOG) 0.5 % Apply 1 application topically 2 (two) times daily.   No facility-administered encounter medications on file as of 07/27/2018.    Allergies  Allergen Reactions  . Penicillins Swelling    Has patient had a PCN reaction causing immediate rash, facial/tongue/throat swelling, SOB or lightheadedness with hypotension: No Has patient had a PCN reaction causing severe rash involving mucus membranes or skin necrosis: No Has patient had a PCN reaction that required hospitalization: No Has patient had a PCN reaction occurring within the last 10 years: No If all of the above answers are "NO", then may proceed with Cephalosporin use.  Marland Kitchen Lisinopril Cough  . Tape Hives  . Doxycycline Hives, Swelling and Rash  . Latex Hives, Itching and Rash   Patient Active Problem List   Diagnosis Date Noted  . Acute blood loss anemia 05/23/2018  . Dehydration 03/22/2018  . AKI (acute kidney injury) ([place]) 03/22/2018  . CVA (cerebral vascular accident) ([place]) 03/22/2018  . Hip injury, left, subsequent encounter 11/08/2017  . Iron deficiency anemia 12/22/2016  . OSA (obstructive sleep apnea) 04/21/2016  . Osteopenia 04/07/2016  . Lung nodule, solitary 02/05/2016  . Aortic dilatation ([place]) 02/05/2016  . Dyspnea 01/28/2016  . Allergic rhinitis 01/23/2016  . Hepatic cirrhosis ([place]) 10/28/2015  . Irritable bowel syndrome 08/12/2015  . Obesity (BMI 30-39.9) 04/30/2015  . Diabetic retinopathy of both eyes ([place]) 04/18/2015  . Former smoker 04/18/2015  . GAD (generalized anxiety disorder) 04/18/2015  . Status post primary angioplasty with coronary stent 04/18/2015  . Coronary artery disease involving native coronary artery 03/01/2015  . Acute diastolic heart failure ([place]) 02/01/2015  . ST elevation myocardial infarction (STEMI) involving left circumflex coronary artery in recovery phase ([place])  01/27/2015  . Idioventricular rhythm ([place])   . Diabetic peripheral neuropathy associated with type 2 diabetes mellitus ([place]) 01/23/2015  . Type 2 diabetes mellitus with diabetic polyneuropathy ([place]) 01/23/2015  . Thrombocytosis ([place]) 07/04/2014  . Cholelithiasis 02/07/2014  . Chronic diarrhea 01/29/2014  . [name] ([place]) 05/31/2013  . Essential hypertension 05/31/2013  . History of Bell's palsy 05/26/2013  . DERMATITIS, ATOPIC 11/16/2010  . DIZZINESS 11/16/2010  . DIASTOLIC DYSFUNCTION 56/97/9480  . Hyperlipidemia 12/15/2009  . Essential hypertension, benign 11/24/2009  . PSORIASIS 11/24/2009  . Depression 09/13/2009  . 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[place]) 07/08/2008   Social History   Socioeconomic History  . Marital status: Married    Spouse name: Not on file  . Number of children: 2  . Years of education: 2 yr colle  . Highest education level: Not on file  Occupational History  . Occupation: housewife  Social Needs  . Financial resource strain: Not on file  . Food insecurity:    Worry: Not on file    Inability: Not on  file  . Transportation needs:    Medical: Not on file    Non-medical: Not on file  Tobacco Use  . Smoking status: Former Smoker    Years: 48.00    Types: Cigarettes    Last attempt to quit: 10/18/1986    Years since quitting: 31.7  . Smokeless tobacco: Never Used  Substance and Sexual Activity  . Alcohol use: No    Alcohol/week: 0.0 standard drinks  . Drug use: No  . Sexual activity: Yes    Partners: Male    Birth control/protection: None  Lifestyle  . Physical activity:    Days per week: Not on file    Minutes per session: Not on file  . Stress: Not on file  Relationships  . Social connections:    Talks on phone: Not on file    Gets together: Not on file    Attends religious service: Not on file    Active member of club or organization: Not on file    Attends meetings of clubs or organizations: Not on file     Relationship status: Not on file  . Intimate partner violence:    Fear of current or ex partner: Not on file    Emotionally abused: Not on file    Physically abused: Not on file    Forced sexual activity: Not on file  Other Topics Concern  . Not on file  Social History Narrative   2 caffeine drink per day.  No regular exercise.     Retired from the bank   2 children (Daughter and a son) son has morbid obesity   2 grandchildren   3 great grandchildren    Ms. Win's family history includes Breast cancer in her maternal aunt; Diabetes in her mother; Heart disease in her maternal grandmother, mother, and other; Hyperlipidemia in her mother; Hypertension in her mother; Kidney disease in her father and mother.      Objective:    Vitals:   07/27/18 1504  BP: (!) 140/58  Pulse: 72    Physical Exam; well-developed elderly white female, no acute distress, accompanied by her husband.  Patient ambulates with difficulty with a walker.  Blood pressure 140/58 pulse 72, weight 226 down 7 pounds from last office visit.  HEENT; nontraumatic normocephalic EOMI PERRLA sclera anicteric mucosa moist, Cardiovascular; regular rate and rhythm with S1-S2, Pulmonary ;clear bilaterally, Abdomen large, protuberant, she has non-tense ascites decreased since last office visit she is nontender no palpable mass or hepatosplenomegaly, Rectal; exam not done, Extremities.;  She still has 2+ edema to the shins bilaterally improved over last office visit.  Neuro psych; alert and oriented, grossly nonfocal mood and affect appropriate, no asterixis       Assessment & Plan:   #3 77 year old white female with decompensated Karlene Lineman cirrhosis with recent worsening of ascites and peripheral edema.  Over the past couple of weeks she has improved with increase in diuretics, and weight is down 7 to 8 pounds. 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  #3 peripheral neuropathy 4.  Insulin-dependent diabetes mellitus 5.  Diastolic heart failure 6.  History of CVA 7.  [name]  Plan; As she is tolerating diuretics will increase Lasix to 60 mg p.o. every morning. Continue Aldactone 100 mg p.o. every morning. Continue 2 g sodium diet We will do follow-up labs in 2 weeks,  CBC , CMET, and pro time/INR I will plan to see her back in the office in 1 month, she knows to call for problems, and if develops increasing abdominal girth she can be scheduled for large-volume paracentesis.  Amy S Esterwood PA-C 07/27/2018   Cc: Amanda Peng, NP

## 2018-07-28 NOTE — Progress Notes (Signed)
I agree with the above note, plan 

## 2018-07-28 NOTE — Telephone Encounter (Signed)
Asked to speak to [facility]. It was to reschedule her follow up appointment. I was able to help her with this.

## 2018-08-04 ENCOUNTER — Other Ambulatory Visit: Payer: Self-pay | Admitting: Adult Health

## 2018-08-04 DIAGNOSIS — F329 Major depressive disorder, single episode, unspecified: Secondary | ICD-10-CM

## 2018-08-13 ENCOUNTER — Other Ambulatory Visit: Payer: Self-pay | Admitting: Internal Medicine

## 2018-08-15 ENCOUNTER — Other Ambulatory Visit (INDEPENDENT_AMBULATORY_CARE_PROVIDER_SITE_OTHER): Payer: PPO

## 2018-08-15 ENCOUNTER — Ambulatory Visit: Payer: PPO | Admitting: Physician Assistant

## 2018-08-15 DIAGNOSIS — R188 Other ascites: Secondary | ICD-10-CM

## 2018-08-15 DIAGNOSIS — K7290 Hepatic failure, unspecified without coma: Secondary | ICD-10-CM

## 2018-08-15 DIAGNOSIS — K7460 Unspecified cirrhosis of liver: Secondary | ICD-10-CM

## 2018-08-15 LAB — COMPREHENSIVE METABOLIC PANEL
ALT: 21 U/L (ref 0–35)
AST: 38 U/L — ABNORMAL HIGH (ref 0–37)
Albumin: 4.4 g/dL (ref 3.5–5.2)
Alkaline Phosphatase: 127 U/L — ABNORMAL HIGH (ref 39–117)
BUN: 30 mg/dL — AB (ref 6–23)
CHLORIDE: 104 meq/L (ref 96–112)
CO2: 24 mEq/L (ref 19–32)
Calcium: 9.2 mg/dL (ref 8.4–10.5)
Creatinine, Ser: 1.32 mg/dL — ABNORMAL HIGH (ref 0.40–1.20)
GFR: 41.27 mL/min — ABNORMAL LOW (ref >60.00)
GLUCOSE: 275 mg/dL — AB (ref 70–99)
POTASSIUM: 4.1 meq/L (ref 3.5–5.1)
Sodium: 136 mEq/L (ref 135–145)
Total Bilirubin: 0.9 mg/dL (ref 0.2–1.2)
Total Protein: 7.1 g/dL (ref 6.0–8.3)

## 2018-08-15 LAB — CBC WITH DIFFERENTIAL/PLATELET
BASOS ABS: 0.1 10*3/uL (ref 0.0–0.1)
BASOS PCT: 0.8 % (ref 0.0–3.0)
EOS ABS: 0.1 10*3/uL (ref 0.0–0.7)
Eosinophils Relative: 0.6 % (ref 0.0–5.0)
HCT: 32.2 % — ABNORMAL LOW (ref 36.0–46.0)
Hemoglobin: 10.4 g/dL — ABNORMAL LOW (ref 12.0–15.0)
Lymphocytes Relative: 15.3 % (ref 12.0–46.0)
Lymphs Abs: 2.3 10*3/uL (ref 0.7–4.0)
MCHC: 32.3 g/dL (ref 30.0–36.0)
MCV: 82.6 fl (ref 78.0–100.0)
MONO ABS: 0.6 10*3/uL (ref 0.1–1.0)
Monocytes Relative: 4.3 % (ref 3.0–12.0)
NEUTROS ABS: 11.9 10*3/uL — AB (ref 1.4–7.7)
Neutrophils Relative %: 79 % — ABNORMAL HIGH (ref 43.0–77.0)
PLATELETS: 353 10*3/uL (ref 150.0–400.0)
RBC: 3.89 Mil/uL (ref 3.87–5.11)
RDW: 28.3 % — AB (ref 11.5–15.5)
WBC: 15.1 10*3/uL — AB (ref 4.0–10.5)

## 2018-08-15 LAB — PROTIME-INR
INR: 1.2 ratio — AB (ref 0.8–1.0)
Prothrombin Time: 14.2 s — ABNORMAL HIGH (ref 9.6–13.1)

## 2018-08-15 MED FILL — JAKAFI 25 MG TABLET: 25 | 30 days supply | Qty: 60 | Fill #1

## 2018-08-16 ENCOUNTER — Encounter: Payer: Self-pay | Admitting: Physician Assistant

## 2018-08-16 ENCOUNTER — Ambulatory Visit (INDEPENDENT_AMBULATORY_CARE_PROVIDER_SITE_OTHER): Payer: PPO | Admitting: Physician Assistant

## 2018-08-16 VITALS — BP 146/70 | HR 84 | Ht 64.5 in | Wt 206.9 lb

## 2018-08-16 DIAGNOSIS — R188 Other ascites: Secondary | ICD-10-CM

## 2018-08-16 DIAGNOSIS — B1920 Unspecified viral hepatitis C without hepatic coma: Secondary | ICD-10-CM | POA: Diagnosis not present

## 2018-08-16 DIAGNOSIS — K7469 Other cirrhosis of liver: Secondary | ICD-10-CM

## 2018-08-16 MED ORDER — DIPHENOXYLATE-ATROPINE 2.5-0.025 MG PO TABS: ORAL_TABLET | ORAL | 2 refills | Status: DC

## 2018-08-16 MED ORDER — LOPERAMIDE HCL 2 MG PO TABS: ORAL_TABLET | ORAL | 2 refills | Status: DC

## 2018-08-16 NOTE — Patient Instructions (Addendum)
Come to out lab, basement level on Tuesday 11-5. Anytime between 7:30 am to 5:30 PM.  Continue all your same medications.  Decrease Lasix to 40 mg by mouth every morning.  We faxed a prescription to [facility] [address] and [address] [place].  We sent a prescription  For Imodium AD.   Normal BMI (Body Mass Index- based on height and weight) is between 23 and 30. Your BMI today is Body mass index is 34.97 kg/m. Marland Kitchen Please consider follow up  regarding your BMI with your Primary Care Provider.

## 2018-08-17 ENCOUNTER — Encounter: Payer: Self-pay | Admitting: Physician Assistant

## 2018-08-17 NOTE — Progress Notes (Signed)
Subjective:    Patient ID: Amanda [name], female    DOB: 1941-10-15, 77 y.o.   MRN: [id number]  HPI Amanda [name] is a pleasant 77 year old white female, known to Dr. [name] and myself who comes in for 2-week follow-up.  She is being followed for Amanda [name] cirrhosis, and has had recent further decompensation with development of significant ascites and peripheral edema.  We have been diuresing and adjusting diuretics. Has history of portal hypertensive gastropathy with severe anemia requiring admission in August 2019.,  Adult onset diabetes mellitus with insulin dependence and retinopathy, sleep apnea, prior CVA, coronary artery disease status post MI and diastolic congestive heart failure. She has lost 20 pounds over the past 3 weeks.  She has been on Lasix 60 mg p.o. every morning and Aldactone 100 mg p.o. daily.  She is also been following a low-sodium diet.  She says she feels much better and her legs are much more comfortable.  She is able to ambulate now without a walker.  She is also had decrease in abdominal girth and again feels much more comfortable.  She is eating without difficulty has no complaints of shortness of breath. Labs were done on 08/15/2018 glucose of 275, creatinine 1.3 up from 1.06, alk phos 127 AST of 3 8, hemoglobin 10.4 stable platelets 333 pro time 14 INR 1.2  Review of Systems Pertinent positive and negative review of systems were noted in the above HPI section.  All other review of systems was otherwise negative.  Outpatient Encounter Medications as of 08/16/2018  Medication Sig  . acetaminophen (TYLENOL) 325 MG tablet Take 2 tablets (650 mg total) by mouth every 6 (six) hours as needed for mild pain (or Fever >/= 101).  Marland Kitchen amLODipine (NORVASC) 10 MG tablet Take 1 tablet (10 mg total) by mouth daily.  . Blood Glucose Monitoring Suppl (ACCU-CHEK GUIDE) w/Device KIT 1 kit by Does not apply route as directed.  . diphenoxylate-atropine (LOMOTIL) 2.5-0.025 MG tablet Take 1 tablet by  mouth twice daily as needed for diarrhea.  . Dulaglutide (TRULICITY) 1.5 FK/8.1EX SOPN Inject 1.5 mg into the skin once a week.  . furosemide (LASIX) 40 MG tablet Take 1 tablet ( 40 mg)  By mouth every morning. (Patient taking differently: Take 60 mg by mouth daily. Take 1 tablet ( 40 mg)  By mouth every morning.)  . gabapentin (NEURONTIN) 100 MG capsule Take 1 capsule (100 mg total) by mouth 2 (two) times daily.  Marland Kitchen glucose blood (ACCU-CHEK GUIDE) test strip Use to check blood sugars 3 times daily  . insulin aspart (NOVOLOG) 100 UNIT/ML injection Inject 0-9 Units into the skin 3 (three) times daily with meals.  . Insulin Pen Needle (BD PEN NEEDLE NANO U/F) 32G X 4 MM MISC USE TO INJECT INSULIN AS DIRECTED  . latanoprost (XALATAN) 0.005 % ophthalmic solution   . lipase/protease/amylase (CREON) 36000 UNITS CPEP capsule Take 1 capsule (36,000 Units total) by mouth 3 (three) times daily with meals.  Marland Kitchen loperamide (IMODIUM A-D) 2 MG tablet Take 1-2 tablets (2-4 mg total) by mouth daily as needed for diarrhea or loose stools.  . metoprolol tartrate (LOPRESSOR) 25 MG tablet Take 0.5 tablets (12.5 mg total) by mouth 2 (two) times daily.  . mirtazapine (REMERON) 7.5 MG tablet TAKE 1 TABLET(7.5 MG) BY MOUTH AT BEDTIME  . ondansetron (ZOFRAN ODT) 4 MG disintegrating tablet Take 1 tablet (4 mg total) by mouth every 8 (eight) hours as needed. (Patient taking differently: Take 4 mg by mouth  every 8 (eight) hours as needed for nausea or vomiting. )  . ONETOUCH VERIO test strip USE TO TEST BLOOD SUGAR THREE TIMES DAILY  . promethazine (PHENERGAN) 25 MG tablet Take 0.5 tablets (12.5 mg total) by mouth every 6 (six) hours as needed for nausea or vomiting.  . rosuvastatin (CRESTOR) 20 MG tablet TAKE 1 TABLET(20 MG) BY MOUTH DAILY  . ruxolitinib phosphate (JAKAFI) 25 MG tablet Take 1 tablet (25 mg total) by mouth 2 (two) times daily.  Marland Kitchen spironolactone (ALDACTONE) 50 MG tablet Take 2 tablets (100 mg total) by mouth  daily.  Marland Kitchen triamcinolone ointment (KENALOG) 0.5 % Apply 1 application topically 2 (two) times daily.  . [DISCONTINUED] diphenoxylate-atropine (LOMOTIL) 2.5-0.025 MG tablet Take 1-2 tablets by mouth 2 (two) times daily as needed for diarrhea or loose stools.  Marland Kitchen loperamide (IMODIUM A-D) 2 MG tablet Take 1 tablet by mouth twice daily for diarrhea.   No facility-administered encounter medications on file as of 08/16/2018.    Allergies  Allergen Reactions  . Penicillins Swelling    Has patient had a PCN reaction causing immediate rash, facial/tongue/throat swelling, SOB or lightheadedness with hypotension: No Has patient had a PCN reaction causing severe rash involving mucus membranes or skin necrosis: No Has patient had a PCN reaction that required hospitalization: No Has patient had a PCN reaction occurring within the last 10 years: No If all of the above answers are "NO", then may proceed with Cephalosporin use.  Marland Kitchen Lisinopril Cough  . Tape Hives  . Doxycycline Hives, Swelling and Rash  . Latex Hives, Itching and Rash   Patient Active Problem List   Diagnosis Date Noted  . Acute blood loss anemia 05/23/2018  . Dehydration 03/22/2018  . AKI (acute kidney injury) ([place]) 03/22/2018  . CVA (cerebral vascular accident) ([place]) 03/22/2018  . Hip injury, left, subsequent encounter 11/08/2017  . Iron deficiency anemia 12/22/2016  . OSA (obstructive sleep apnea) 04/21/2016  . Osteopenia 04/07/2016  . Lung nodule, solitary 02/05/2016  . Aortic dilatation ([place]) 02/05/2016  . Dyspnea 01/28/2016  . Allergic rhinitis 01/23/2016  . Hepatic cirrhosis ([place]) 10/28/2015  . Irritable bowel syndrome 08/12/2015  . Obesity (BMI 30-39.9) 04/30/2015  . Diabetic retinopathy of both eyes ([place]) 04/18/2015  . Former smoker 04/18/2015  . GAD (generalized anxiety disorder) 04/18/2015  . Status post primary angioplasty with coronary stent 04/18/2015  . Coronary artery disease involving native coronary artery  03/01/2015  . Acute diastolic heart failure ([place]) 02/01/2015  . ST elevation myocardial infarction (STEMI) involving left circumflex coronary artery in recovery phase ([place]) 01/27/2015  . Idioventricular rhythm ([place])   . Diabetic peripheral neuropathy associated with type 2 diabetes mellitus ([place]) 01/23/2015  . Type 2 diabetes mellitus with diabetic polyneuropathy ([place]) 01/23/2015  . Thrombocytosis ([place]) 07/04/2014  . Cholelithiasis 02/07/2014  . Chronic diarrhea 01/29/2014  . [name] ([place]) 05/31/2013  . Essential hypertension 05/31/2013  . History of Bell's palsy 05/26/2013  . DERMATITIS, ATOPIC 11/16/2010  . DIZZINESS 11/16/2010  . DIASTOLIC DYSFUNCTION 50/35/4656  . Hyperlipidemia 12/15/2009  . Essential hypertension, benign 11/24/2009  . PSORIASIS 11/24/2009  . Depression 09/13/2009  . 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[place]) 07/08/2008   Social History   Socioeconomic History  . Marital status: Married    Spouse name: Not on file  . Number of children: 2  . Years of education: 2 yr colle  . Highest education level: Not on file  Occupational History  . Occupation: housewife  Social Needs  .  Financial resource strain: Not on file  . Food insecurity:    Worry: Not on file    Inability: Not on file  . Transportation needs:    Medical: Not on file    Non-medical: Not on file  Tobacco Use  . Smoking status: Former Smoker    Years: 48.00    Types: Cigarettes    Last attempt to quit: 10/18/1986    Years since quitting: 31.8  . Smokeless tobacco: Never Used  Substance and Sexual Activity  . Alcohol use: No    Alcohol/week: 0.0 standard drinks  . Drug use: No  . Sexual activity: Yes    Partners: Male    Birth control/protection: None  Lifestyle  . Physical activity:    Days per week: Not on file    Minutes per session: Not on file  . Stress: Not on file  Relationships  . Social connections:    Talks on phone: Not on file    Gets together:  Not on file    Attends religious service: Not on file    Active member of club or organization: Not on file    Attends meetings of clubs or organizations: Not on file    Relationship status: Not on file  . Intimate partner violence:    Fear of current or ex partner: Not on file    Emotionally abused: Not on file    Physically abused: Not on file    Forced sexual activity: Not on file  Other Topics Concern  . Not on file  Social History Narrative   2 caffeine drink per day.  No regular exercise.     Retired from the bank   2 children (Daughter and a son) son has morbid obesity   2 grandchildren   3 great grandchildren    Ms. Amanda [name]'s family history includes Breast cancer in her maternal aunt; Diabetes in her mother; Heart disease in her maternal grandmother, mother, and other; Hyperlipidemia in her mother; Hypertension in her mother; Kidney disease in her father and mother.      Objective:    Vitals:   08/16/18 0935  BP: (!) 146/70  Pulse: 84    Physical Exam; well-developed elderly white female in no acute distress, accompanied by her husband, both pleasant blood pressure 146/70 pulse 84, BMI 34.9 weight is down to 206 today, down 20 pounds from last office visit.  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 She has been diuresing over the past several weeks  and is down a total of about 28 pounds over the past month and a half.  She feels much better.  Creatinine done yesterday has bumped up.  #2 chronic anemia stable #3.  Multiple comorbidities as outlined above  Plan; Decrease Lasix to 40 mg p.o. every morning, and continue Aldactone 100 mg p.o. every morning. Repeat BMET  in 1week and then adjust diuretics accordingly. She will continue all of her other medications without change. They asked  for a refill on Lomotil and Imodium right ear which she uses on a as needed basis, infrequently, and refills were sent. We will plan to follow-up in the office in about 6 weeks.  Nitza Schmid S Loni Abdon PA-C 08/17/2018   Cc: [name], NP

## 2018-08-17 NOTE — Progress Notes (Signed)
I agree with the above note, plane

## 2018-08-22 ENCOUNTER — Other Ambulatory Visit (INDEPENDENT_AMBULATORY_CARE_PROVIDER_SITE_OTHER): Payer: PPO

## 2018-08-22 DIAGNOSIS — B1920 Unspecified viral hepatitis C without hepatic coma: Secondary | ICD-10-CM | POA: Diagnosis not present

## 2018-08-22 DIAGNOSIS — R188 Other ascites: Secondary | ICD-10-CM | POA: Diagnosis not present

## 2018-08-22 DIAGNOSIS — K7469 Other cirrhosis of liver: Secondary | ICD-10-CM | POA: Diagnosis not present

## 2018-08-22 LAB — BASIC METABOLIC PANEL
BUN: 33 mg/dL — AB (ref 6–23)
CHLORIDE: 106 meq/L (ref 96–112)
CO2: 24 meq/L (ref 19–32)
Calcium: 9.7 mg/dL (ref 8.4–10.5)
Creatinine, Ser: 1.48 mg/dL — ABNORMAL HIGH (ref 0.40–1.20)
GFR: 36.16 mL/min — AB (ref >60.00)
GLUCOSE: 173 mg/dL — AB (ref 70–99)
POTASSIUM: 4.3 meq/L (ref 3.5–5.1)
SODIUM: 140 meq/L (ref 135–145)

## 2018-08-23 ENCOUNTER — Telehealth: Payer: Self-pay | Admitting: Physician Assistant

## 2018-08-23 ENCOUNTER — Other Ambulatory Visit: Payer: Self-pay

## 2018-08-23 ENCOUNTER — Telehealth: Payer: Self-pay | Admitting: Internal Medicine

## 2018-08-23 DIAGNOSIS — K7469 Other cirrhosis of liver: Principal | ICD-10-CM

## 2018-08-23 DIAGNOSIS — B1920 Unspecified viral hepatitis C without hepatic coma: Secondary | ICD-10-CM

## 2018-08-23 NOTE — Telephone Encounter (Signed)
[name] wanted [place] to know sh eis taking 20 mg laisx( furosemide ) and spironolactone is 134m- 2- 50 mg tabs-  Noted  MLelan PonsPV

## 2018-08-23 NOTE — Telephone Encounter (Signed)
Pt would like to speak with you regarding some change in her medications. She said that she takes all generic meds so needs the generic names of the medications that she needs to change.

## 2018-08-23 NOTE — Telephone Encounter (Signed)
Called patients husband, we do not see patient here. Nothing further needed.

## 2018-08-25 ENCOUNTER — Other Ambulatory Visit: Payer: Self-pay

## 2018-08-25 ENCOUNTER — Inpatient Hospital Stay (HOSPITAL_BASED_OUTPATIENT_CLINIC_OR_DEPARTMENT_OTHER): Payer: PPO | Admitting: Hematology & Oncology

## 2018-08-25 ENCOUNTER — Inpatient Hospital Stay: Payer: PPO | Attending: Family

## 2018-08-25 VITALS — BP 151/57 | HR 76 | Temp 97.4°F | Resp 19 | Wt 220.4 lb

## 2018-08-25 DIAGNOSIS — E119 Type 2 diabetes mellitus without complications: Secondary | ICD-10-CM | POA: Insufficient documentation

## 2018-08-25 DIAGNOSIS — K7460 Unspecified cirrhosis of liver: Secondary | ICD-10-CM

## 2018-08-25 DIAGNOSIS — D45 Polycythemia vera: Secondary | ICD-10-CM | POA: Insufficient documentation

## 2018-08-25 DIAGNOSIS — M7989 Other specified soft tissue disorders: Secondary | ICD-10-CM | POA: Insufficient documentation

## 2018-08-25 DIAGNOSIS — Z794 Long term (current) use of insulin: Secondary | ICD-10-CM

## 2018-08-25 DIAGNOSIS — E1142 Type 2 diabetes mellitus with diabetic polyneuropathy: Secondary | ICD-10-CM

## 2018-08-25 DIAGNOSIS — Z79899 Other long term (current) drug therapy: Secondary | ICD-10-CM | POA: Insufficient documentation

## 2018-08-25 DIAGNOSIS — R188 Other ascites: Secondary | ICD-10-CM

## 2018-08-25 LAB — RETICULOCYTES
Immature Retic Fract: 23.5 % — ABNORMAL HIGH (ref 2.3–15.9)
RBC.: 3.88 MIL/uL (ref 3.87–5.11)
RETIC CT PCT: 1.6 % (ref 0.4–3.1)
Retic Count, Absolute: 62.1 10*3/uL (ref 19.0–186.0)

## 2018-08-25 LAB — CBC WITH DIFFERENTIAL (CANCER CENTER ONLY)
Abs Immature Granulocytes: 0.87 10*3/uL — ABNORMAL HIGH (ref 0.00–0.07)
BASOS ABS: 0.1 10*3/uL (ref 0.0–0.1)
Basophils Relative: 1 %
EOS ABS: 0 10*3/uL (ref 0.0–0.5)
EOS PCT: 0 %
HEMATOCRIT: 33 % — AB (ref 36.0–46.0)
HEMOGLOBIN: 10 g/dL — AB (ref 12.0–15.0)
IMMATURE GRANULOCYTES: 7 %
LYMPHS ABS: 1.9 10*3/uL (ref 0.7–4.0)
LYMPHS PCT: 16 %
MCH: 25.8 pg — ABNORMAL LOW (ref 26.0–34.0)
MCHC: 30.3 g/dL (ref 30.0–36.0)
MCV: 85.1 fL (ref 80.0–100.0)
MONOS PCT: 5 %
Monocytes Absolute: 0.7 10*3/uL (ref 0.1–1.0)
NRBC: 0.3 % — AB (ref 0.0–0.2)
Neutro Abs: 8.7 10*3/uL — ABNORMAL HIGH (ref 1.7–7.7)
Neutrophils Relative %: 71 %
Platelet Count: 321 10*3/uL (ref 150–400)
RBC: 3.88 MIL/uL (ref 3.87–5.11)
RDW: 25.9 % — ABNORMAL HIGH (ref 11.5–15.5)
WBC Count: 12.3 10*3/uL — ABNORMAL HIGH (ref 4.0–10.5)

## 2018-08-25 LAB — IRON AND TIBC
Iron: 138 ug/dL (ref 41–142)
SATURATION RATIOS: 36 % (ref 21–57)
TIBC: 383 ug/dL (ref 236–444)
UIBC: 244 ug/dL (ref 120–384)

## 2018-08-25 LAB — CMP (CANCER CENTER ONLY)
ALBUMIN: 3.9 g/dL (ref 3.5–5.0)
ALT: 24 U/L (ref 10–47)
ANION GAP: 3 — AB (ref 5–15)
AST: 50 U/L — AB (ref 11–38)
Alkaline Phosphatase: 109 U/L — ABNORMAL HIGH (ref 26–84)
BILIRUBIN TOTAL: 1.1 mg/dL (ref 0.2–1.6)
BUN: 33 mg/dL — ABNORMAL HIGH (ref 7–22)
CALCIUM: 9.5 mg/dL (ref 8.0–10.3)
CO2: 24 mmol/L (ref 18–33)
CREATININE: 1.6 mg/dL — AB (ref 0.60–1.20)
Chloride: 112 mmol/L — ABNORMAL HIGH (ref 98–108)
Glucose, Bld: 170 mg/dL — ABNORMAL HIGH (ref 73–118)
Potassium: 4.7 mmol/L (ref 3.3–4.7)
Sodium: 139 mmol/L (ref 128–145)
Total Protein: 7 g/dL (ref 6.4–8.1)

## 2018-08-25 LAB — LACTATE DEHYDROGENASE: LDH: 329 U/L — AB (ref 98–192)

## 2018-08-25 LAB — FERRITIN: Ferritin: 65 ng/mL (ref 11–307)

## 2018-08-25 LAB — SAVE SMEAR(SSMR), FOR PROVIDER SLIDE REVIEW

## 2018-08-25 LAB — SAMPLE TO BLOOD BANK

## 2018-08-25 NOTE — Progress Notes (Signed)
Hematology and Oncology Follow Up Visit  Amanda [name] [id number] [birth year] 77 y.o.  08/25/2018   Principle Diagnosis:  [name] - JAK2 (+)  Current Therapy:   Jakafi 25 mg po BID   Interim History:  Amanda [name] is here today with her husband for follow-up.  She FINALLY looks like her old self.  She finally is getting better.  The aggressive diuresis really has helped her out.  She is no longer using a walker.  She is at home.  She is doing more in the way of housework and cooking.  Again her lifestyle is getting back to what she likes it to be.  I am just very happy for her.  She sees her diabetic doctor next month.  This I think will be a big issue with respect to how to keep managing her blood sugars.  She has had no problems with the [facility].  She has had no nausea or vomiting.  There is no diarrhea.  Her leg swelling has improved tremendously.  She is had no bleeding.  Overall, her performance status is ECOG 1.   Medications:  Allergies as of 08/25/2018      Reactions   Penicillins Swelling   Has patient had a PCN reaction causing immediate rash, facial/tongue/throat swelling, SOB or lightheadedness with hypotension: No Has patient had a PCN reaction causing severe rash involving mucus membranes or skin necrosis: No Has patient had a PCN reaction that required hospitalization: No Has patient had a PCN reaction occurring within the last 10 years: No If all of the above answers are "NO", then may proceed with Cephalosporin use.   Lisinopril Cough   Tape Hives   Doxycycline Hives, Swelling, Rash   Latex Hives, Itching, Rash      Medication List        Accurate as of 08/25/18 10:33 AM. Always use your most recent med list.          ACCU-CHEK GUIDE w/Device Kit 1 kit by Does not apply route as directed.   acetaminophen 325 MG tablet Commonly known as:  TYLENOL Take 2 tablets (650 mg total) by mouth every 6 (six) hours as needed for mild pain (or Fever >/=  101).   amLODipine 10 MG tablet Commonly known as:  NORVASC Take 1 tablet (10 mg total) by mouth daily.   clopidogrel 75 MG tablet Commonly known as:  PLAVIX Take 75 mg by mouth.   diphenoxylate-atropine 2.5-0.025 MG tablet Commonly known as:  LOMOTIL Take 1 tablet by mouth twice daily as needed for diarrhea.   Dulaglutide 1.5 MG/0.5ML Sopn Inject 1.5 mg into the skin once a week.   furosemide 40 MG tablet Commonly known as:  LASIX Take 1 tablet ( 40 mg)  By mouth every morning.   gabapentin 100 MG capsule Commonly known as:  NEURONTIN Take 1 capsule (100 mg total) by mouth 2 (two) times daily.   glucose blood test strip Use to check blood sugars 3 times daily   ONETOUCH VERIO test strip Generic drug:  glucose blood USE TO TEST BLOOD SUGAR THREE TIMES DAILY   insulin aspart 100 UNIT/ML injection Commonly known as:  novoLOG Inject 0-9 Units into the skin 3 (three) times daily with meals.   Insulin Pen Needle 32G X 4 MM Misc USE TO INJECT INSULIN AS DIRECTED   latanoprost 0.005 % ophthalmic solution Commonly known as:  XALATAN   lipase/protease/amylase 36000 UNITS Cpep capsule Commonly known as:  CREON Take 1  capsule (36,000 Units total) by mouth 3 (three) times daily with meals.   loperamide 2 MG tablet Commonly known as:  IMODIUM A-D Take 1-2 tablets (2-4 mg total) by mouth daily as needed for diarrhea or loose stools.   loperamide 2 MG tablet Commonly known as:  IMODIUM A-D Take 1 tablet by mouth twice daily for diarrhea.   losartan 100 MG tablet Commonly known as:  COZAAR Take 100 mg by mouth daily.   metoprolol tartrate 25 MG tablet Commonly known as:  LOPRESSOR Take 0.5 tablets (12.5 mg total) by mouth 2 (two) times daily.   mirtazapine 7.5 MG tablet Commonly known as:  REMERON TAKE 1 TABLET(7.5 MG) BY MOUTH AT BEDTIME   ondansetron 4 MG disintegrating tablet Commonly known as:  ZOFRAN-ODT Take 1 tablet (4 mg total) by mouth every 8 (eight)  hours as needed.   promethazine 25 MG tablet Commonly known as:  PHENERGAN Take 0.5 tablets (12.5 mg total) by mouth every 6 (six) hours as needed for nausea or vomiting.   rosuvastatin 20 MG tablet Commonly known as:  CRESTOR TAKE 1 TABLET(20 MG) BY MOUTH DAILY   ruxolitinib phosphate 25 MG tablet Commonly known as:  JAKAFI Take 1 tablet (25 mg total) by mouth 2 (two) times daily.   spironolactone 50 MG tablet Commonly known as:  ALDACTONE Take 2 tablets (100 mg total) by mouth daily.   triamcinolone ointment 0.5 % Commonly known as:  KENALOG Apply 1 application topically 2 (two) times daily.       Allergies:  Allergies  Allergen Reactions  . Penicillins Swelling    Has patient had a PCN reaction causing immediate rash, facial/tongue/throat swelling, SOB or lightheadedness with hypotension: No Has patient had a PCN reaction causing severe rash involving mucus membranes or skin necrosis: No Has patient had a PCN reaction that required hospitalization: No Has patient had a PCN reaction occurring within the last 10 years: No If all of the above answers are "NO", then may proceed with Cephalosporin use.  Marland Kitchen Lisinopril Cough  . Tape Hives  . Doxycycline Hives, Swelling and Rash  . Latex Hives, Itching and Rash    Past Medical History, Surgical history, Social history, and Family History were reviewed and updated.  Review of Systems: Review of Systems  Constitutional: Negative.   HENT: Negative.  Negative for hearing loss.   Eyes: Negative.   Respiratory: Positive for shortness of breath.   Cardiovascular: Positive for leg swelling.  Gastrointestinal: Negative.   Genitourinary: Positive for frequency.  Musculoskeletal: Positive for joint pain and myalgias.  Skin: Negative.   Neurological: Negative.   Endo/Heme/Allergies: Negative.   Psychiatric/Behavioral: Negative.       Physical Exam:  weight is 220 lb 6.4 oz (100 kg). Her oral temperature is 97.4 F (36.3 C)  (abnormal). Her blood pressure is 151/57 (abnormal) and her pulse is 76. Her respiration is 19 and oxygen saturation is 99%.   Wt Readings from Last 3 Encounters:  08/25/18 220 lb 6.4 oz (100 kg)  08/16/18 206 lb 14.4 oz (93.8 kg)  07/27/18 226 lb 4 oz (102.6 kg)    Physical Exam  Constitutional: She is oriented to person, place, and time.  HENT:  Head: Normocephalic and atraumatic.  Mouth/Throat: Oropharynx is clear and moist.  Eyes: Pupils are equal, round, and reactive to light. EOM are normal.  Neck: Normal range of motion.  Cardiovascular: Normal rate, regular rhythm and normal heart sounds.  Pulmonary/Chest: Effort normal and breath sounds  normal.  Lungs show some decreased breath sounds throughout both lung fields.  She has good breath sounds bilaterally.  Abdominal: Soft. Bowel sounds are normal.  Abdominal exam shows a somewhat obese abdomen.  There is no obvious fluid wave.  I cannot palpate a liver or spleen tip.  Musculoskeletal: Normal range of motion. She exhibits no edema, tenderness or deformity.  Extremities shows 2+ edema in her legs.  Lymphadenopathy:    She has no cervical adenopathy.  Neurological: She is alert and oriented to person, place, and time.  Skin: Skin is warm and dry. No rash noted. No erythema.  Psychiatric: She has a normal mood and affect. Her behavior is normal. Judgment and thought content normal.  Vitals reviewed.    Lab Results  Component Value Date   WBC 12.3 (H) 08/25/2018   HGB 10.0 (L) 08/25/2018   HCT 33.0 (L) 08/25/2018   MCV 85.1 08/25/2018   PLT 321 08/25/2018   Lab Results  Component Value Date   FERRITIN 54 07/14/2018   IRON 40 (L) 07/14/2018   TIBC 346 07/14/2018   UIBC 306 07/14/2018   IRONPCTSAT 12 (L) 07/14/2018   Lab Results  Component Value Date   RETICCTPCT 1.6 08/25/2018   RBC 3.88 08/25/2018   RBC 3.88 08/25/2018   No results found for: Nils Pyle [facility] Lab Results  Component Value  Date   IGA 233 03/29/2014   No results found for: Kathrynn Ducking, MSPIKE, SPEI   Chemistry      Component Value Date/Time   NA 139 08/25/2018 0839   NA 140 10/12/2017 1013   NA 138 01/21/2017 0806   K 4.7 08/25/2018 0839   K 3.7 10/12/2017 1013   K 4.1 01/21/2017 0806   CL 112 (H) 08/25/2018 0839   CL 103 10/12/2017 1013   CL 103 02/16/2013 0900   CO2 24 08/25/2018 0839   CO2 25 10/12/2017 1013   CO2 21 (L) 01/21/2017 0806   BUN 33 (H) 08/25/2018 0839   BUN 10 10/12/2017 1013   BUN 12.2 01/21/2017 0806   CREATININE 1.60 (H) 08/25/2018 0839   CREATININE 0.9 10/12/2017 1013   CREATININE 1.0 01/21/2017 0806      Component Value Date/Time   CALCIUM 9.5 08/25/2018 0839   CALCIUM 8.7 10/12/2017 1013   CALCIUM 9.3 01/21/2017 0806   ALKPHOS 109 (H) 08/25/2018 0839   ALKPHOS 97 (H) 10/12/2017 1013   ALKPHOS 112 01/21/2017 0806   AST 50 (H) 08/25/2018 0839   AST 37 (H) 01/21/2017 0806   ALT 24 08/25/2018 0839   ALT 25 10/12/2017 1013   ALT 16 01/21/2017 0806   BILITOT 1.1 08/25/2018 0839   BILITOT 0.89 01/21/2017 0806      Impression and Plan: Ms. Amescua is a very pleasant 77 yo caucasian female with polycythemia vera, JAK-2 positive.   Her birthday is coming up next week.  I am sorry happy for her.  I think that we can now get her back next year.  She is at the point where she is much more independent.  This really is a nice way to end the year for her.  She has had a such a tough time this year.        Volanda Napoleon, MD 11/8/201910:33 AM

## 2018-08-28 DIAGNOSIS — H401133 Primary open-angle glaucoma, bilateral, severe stage: Secondary | ICD-10-CM | POA: Diagnosis not present

## 2018-08-30 ENCOUNTER — Other Ambulatory Visit (INDEPENDENT_AMBULATORY_CARE_PROVIDER_SITE_OTHER): Payer: PPO

## 2018-08-30 DIAGNOSIS — K7469 Other cirrhosis of liver: Secondary | ICD-10-CM | POA: Diagnosis not present

## 2018-08-30 DIAGNOSIS — B1920 Unspecified viral hepatitis C without hepatic coma: Secondary | ICD-10-CM | POA: Diagnosis not present

## 2018-08-30 LAB — BASIC METABOLIC PANEL
BUN: 24 mg/dL — ABNORMAL HIGH (ref 6–23)
CALCIUM: 9.7 mg/dL (ref 8.4–10.5)
CHLORIDE: 109 meq/L (ref 96–112)
CO2: 23 meq/L (ref 19–32)
CREATININE: 1.31 mg/dL — AB (ref 0.40–1.20)
GFR: 41.63 mL/min — ABNORMAL LOW (ref >60.00)
GLUCOSE: 118 mg/dL — AB (ref 70–99)
Potassium: 4.3 mEq/L (ref 3.5–5.1)
Sodium: 141 mEq/L (ref 135–145)

## 2018-08-31 ENCOUNTER — Other Ambulatory Visit: Payer: Self-pay

## 2018-08-31 DIAGNOSIS — K7469 Other cirrhosis of liver: Principal | ICD-10-CM

## 2018-08-31 DIAGNOSIS — B1920 Unspecified viral hepatitis C without hepatic coma: Secondary | ICD-10-CM

## 2018-09-08 ENCOUNTER — Telehealth: Payer: Self-pay | Admitting: Physician Assistant

## 2018-09-08 NOTE — Telephone Encounter (Signed)
Spoke with the spouse. He rechecked her medications. He states he misread the instructions on the Aldactone and was giving her only 50 mg past few days. She will increase it back to 2 of the fifty mg tablets for the intended dosage of 100 mg daily.

## 2018-09-08 NOTE — Telephone Encounter (Signed)
Patients husband calling to speak with Amy's nurse. Patients husband states pt has gained 2 pounds and wants to know if they should increase her lasix medication.

## 2018-09-11 MED FILL — JAKAFI 25 MG TABLET: 25 | 30 days supply | Qty: 60 | Fill #2

## 2018-09-19 ENCOUNTER — Encounter: Payer: Self-pay | Admitting: Cardiovascular Disease

## 2018-09-19 ENCOUNTER — Telehealth: Payer: Self-pay | Admitting: Cardiovascular Disease

## 2018-09-19 ENCOUNTER — Ambulatory Visit (INDEPENDENT_AMBULATORY_CARE_PROVIDER_SITE_OTHER): Payer: PPO | Admitting: Cardiovascular Disease

## 2018-09-19 VITALS — BP 164/73 | HR 80 | Ht 64.0 in | Wt 228.0 lb

## 2018-09-19 DIAGNOSIS — E785 Hyperlipidemia, unspecified: Secondary | ICD-10-CM | POA: Diagnosis not present

## 2018-09-19 DIAGNOSIS — R600 Localized edema: Secondary | ICD-10-CM

## 2018-09-19 DIAGNOSIS — I10 Essential (primary) hypertension: Secondary | ICD-10-CM | POA: Diagnosis not present

## 2018-09-19 DIAGNOSIS — Z794 Long term (current) use of insulin: Secondary | ICD-10-CM

## 2018-09-19 DIAGNOSIS — D45 Polycythemia vera: Secondary | ICD-10-CM

## 2018-09-19 DIAGNOSIS — E118 Type 2 diabetes mellitus with unspecified complications: Secondary | ICD-10-CM

## 2018-09-19 DIAGNOSIS — I2510 Atherosclerotic heart disease of native coronary artery without angina pectoris: Secondary | ICD-10-CM

## 2018-09-19 MED ORDER — FUROSEMIDE 40 MG PO TABS: 40.0000 mg | ORAL_TABLET | Freq: Every day | ORAL | 3 refills | Status: DC

## 2018-09-19 MED ORDER — EZETIMIBE 10 MG PO TABS: 10.0000 mg | ORAL_TABLET | Freq: Every day | ORAL | 3 refills | Status: DC

## 2018-09-19 MED ORDER — AMLODIPINE BESYLATE 5 MG PO TABS: 5.0000 mg | ORAL_TABLET | Freq: Every day | ORAL | 3 refills | Status: DC

## 2018-09-19 NOTE — Patient Instructions (Signed)
Medication Instructions:  DECREASE amlodipine to 5 mg daily INCREASE furosemide (Lasix) to 40 mg daily START Zetia 10 mg daily  If you need a refill on your cardiac medications before your next appointment, please call your pharmacy.   Lab work: Please return for FASTING labs in 2 months (CMET, Lipid)  Our in office lab hours are Monday-Friday 8:00-4:00, closed for lunch 12:45-1:45 pm.  No appointment needed.  If you have labs (blood work) drawn today and your tests are completely normal, you will receive your results only by: Marland Kitchen MyChart Message (if you have MyChart) OR . A paper copy in the mail If you have any lab test that is abnormal or we need to change your treatment, we will call you to review the results.  Follow-Up: At [facility], you and your health needs are our priority.  As part of our continuing mission to provide you with exceptional heart care, we have created designated Provider Care Teams.  These Care Teams include your primary Cardiologist (physician) and Advanced Practice Providers (APPs -  Physician Assistants and Nurse Practitioners) who all work together to provide you with the care you need, when you need it. You will need a follow up appointment in 3 months.  Please call our office 2 months in advance to schedule this appointment.  You may see Dr. [name] or one of the following Advanced Practice Providers on your designated Care Team: [place], [place] . Fabian Sharp, PA-C  Any Other Special Instructions Will Be Listed Below (If Applicable).    How to Use Compression Stockings Compression stockings are elastic socks that squeeze the legs. They help to increase blood flow to the legs, decrease swelling in the legs, and reduce the chance of developing blood clots in the lower legs. Compression stockings are often used by people who:  Are recovering from surgery.  Have poor circulation in their legs.  Are prone to getting blood clots in their legs.  Have  varicose veins.  Sit or stay in bed for long periods of time.  How to use compression stockings Before you put on your compression stockings:  Make sure that they are the correct size. If you do not know your size, ask your health care provider.  Make sure that they are clean, dry, and in good condition.  Check them for rips and tears. Do not put them on if they are ripped or torn.  Put your stockings on first thing in the morning, before you get out of bed. Keep them on for as long as your health care provider advises. When you are wearing your stockings:  Keep them as smooth as possible. Do not allow them to bunch up. It is especially important to prevent the stockings from bunching up around your toes or behind your knees.  Do not roll the stockings downward and leave them rolled down. This can decrease blood flow to your leg.  Change them right away if they become wet or dirty.  When you take off your stockings, inspect your legs and feet. Anything that does not seem normal may require medical attention. Look for:  Open sores.  Red spots.  Swelling.  Information and tips  Do not stop wearing your compression stockings without talking to your health care provider first.  Wash your stockings every day with mild detergent in cold or warm water. Do not use bleach. Air-dry your stockings or dry them in a clothes dryer on low heat.  Replace your stockings  every 3-6 months.  If skin moisturizing is part of your treatment plan, apply lotion or cream at night so that your skin will be dry when you put on the stockings in the morning. It is harder to put the stockings on when you have lotion on your legs or feet. Contact a health care provider if: Remove your stockings and seek medical care if:  You have a feeling of pins and needles in your feet or legs.  You have any new changes in your skin.  You have skin lesions that are getting worse.  You have swelling or pain that is  getting worse.  Get help right away if:  You have numbness or tingling in your lower legs that does not get better right after you take the stockings off.  Your toes or feet become cold and blue.  You develop open sores or red spots on your legs that do not go away.  You see or feel a warm spot on your leg.  You have new swelling or soreness in your leg.  You are short of breath or you have chest pain for no reason.  You have a rapid or irregular heartbeat.  You feel light-headed or dizzy. This information is not intended to replace advice given to you by your health care provider. Make sure you discuss any questions you have with your health care provider. Document Released: 08/01/2009 Document Revised: 03/03/2016 Document Reviewed: 09/11/2014 Elsevier Interactive Patient Education  [employer].

## 2018-09-19 NOTE — Progress Notes (Signed)
Patient ID: Amanda [name], female   DOB: 1941-03-10, 77 y.o.   MRN: [id number]     HPI: KENADY DOXTATER is a 77 y.o. female who presents to the office today for a 14 month follow-up cardiology evaluation.    Ms. Fossum has a history of hypertension, type 2 diabetes mellitus, polycythemia vera, as well as peripheral neuropathy.  She developed new onset chest pain on 01/27/2015 and was found have 3 mm ST segment elevation by EMS.  During transport to the hospital she developed idioventricular rhythm which was treated with amiodarone bolus.  I took her acutely to the cardiac catheterization laboratory on 01/27/2015.  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  She underwent successful PCI intervention with  insertion of a  Xience Alpine 3.023 mm DES stent postdilated to 3.15 millimeters in the mid lesion and the distal stenosis was treated with a 2.518 mm Xience Alpine DES stent postdilated to 2.57 mm.  It was felt that her idioventricular rhythm was probably the result of spontaneous reperfusion in route to the hospital.  She initially was discharged after 3 days to [facility] rehabilitation which did she not find to be helpful and went home 3 days later with home health care coming to her house.  During her hospitalization, an echo Doppler study revealed an EF of 50-55%.  She had abnormal tissue Doppler.  There is mitral annular calcification and moderate LA dilatation.  She had abnormal posterior lateral and septal strain.  When I saw her last year she was having difficulty with bloating and diarrhea.  She denied any recurrent episodes of chest pain.  She admits to exertional shortness of breath.  She is not very active and does not ambulate well.   After one year of full dose dual antiplatelet therapy, she recently  was started on reduced dose of Brilinta at 60 mg twice a day per Pegasys trial data.  She was hospitalized from April 12 through 01/30/2016 a several week history of shortness of breath.  Her hospitalization she was seen by cardiology and was not felt to be due to CHF.  She presented with markedly elevated glucose with metabolic acidosis but without an anion gap.  She has continued to be on Jakafi for  polycythemia vera.  She was found to have a 7 x 6 mm nodule opacify in the superior segment of the right lower lobe on CT and a hollow of noncontrast chest CT at 6-12 months was recommended.  She has continued to have fatigue.  She saw Dr. [name] of her in follow-up evaluation and he was concerned that additional evaluation of her myeloproliferative disorder and according to the patient.   With reference to her fatigue, she is extremely tired.  At times she may go to bed at 6:30 at night.  She does snore.  Her sleep is nonrestorative.  She admits to daytime sleepiness.  She has never had an evaluation for sleep apnea and when I last saw her, I strongly recommended a sleep study for further evaluation  She was [place] [facility] .  During the summer.  Multiple family members were coughing week prior to the hospitalization.  Reportedly, she spent 8 days in the hospital and developed kidney insufficiency.  Reportedly, her creatinine had increased to 4 before trending down.  Venous Dopplers were negative for DVT.  A right renal mass was seen, which she has undergone subsequent evaluation.  She saw Dr. Inda Castle in August 2018.  An MRI of her kidney was negative for mass.  She also has seen Dr. [name] for her polycythemia vera.  When I last saw her in October 2018 she was without chest pain.  She had not been successful with weight loss.  She had seen a liver specialist and was told of possibly having hepatic steatosis rather than cirrhosis.   Over the past  year, apparently she was hospitalized in June 2019 and felt to have a mini stroke.  He had presented with intermittent lightheadedness and dizziness and had weakness in her legs.  Infarcts were noted on MRI but felt most likely not responsible for her presenting lower extremity weakness.  Her bilateral carotid Dopplers did not reveal high-grade abnormality.  A 2D echo Doppler study showed an EF of 60 to 65% with grade 1 diastolic dysfunction.  A bubble study was inadequate.  Admits to mild residual difficulty with walking is not using a walker.  She admits to leg swelling.  She denies chest pressure or palpitations.  She presents for evaluation. Past Medical History:  Diagnosis Date  . Allergic rhinitis 01/23/2016  . C. difficile colitis   . CAD (coronary artery disease)    a. s/p STEMI in 01/2015 with 95% LCx stenosis and distal 80% LCx stenosis (DESx2 placed)  . Cancer ([place])    SKIN  . Candidiasis of skin 09/30/2014  . Cirrhosis ([place])   . Depression   . Diabetes mellitus 2008  . Gout   . Herpes simplex   . Hyperglycemia 05/31/2013  . Hyperlipidemia   . Hypertension   . Myocardial infarction ([place])   . 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 Obesity   . Polycythemia    Dr. Elease Hashimoto- [facility]  . Psoriasis     Past Surgical History:  Procedure Laterality Date  . BIOPSY  05/26/2018   Procedure: BIOPSY;  Surgeon: [name], MD;  Location: [facility] ENDOSCOPY;  Service: Endoscopy;;  . BREAST SURGERY Left    milk duct  . CATARACT EXTRACTION, BILATERAL    . COLONOSCOPY    . ESOPHAGOGASTRODUODENOSCOPY N/A 05/26/2018   Procedure: ESOPHAGOGASTRODUODENOSCOPY (EGD);  Surgeon: [name], MD;  Location: [facility] ENDOSCOPY;  Service: Endoscopy;  Laterality: N/A;  . IR PARACENTESIS  05/25/2018  . LEFT HEART CATHETERIZATION WITH CORONARY ANGIOGRAM N/A 01/27/2015   Procedure: LEFT HEART CATHETERIZATION WITH CORONARY ANGIOGRAM;  Surgeon: [name], MD;  Location: [facility] CATH LAB;  Service:  Cardiovascular;  Laterality: N/A;  . TONSILLECTOMY    . TOTAL ABDOMINAL HYSTERECTOMY W/ BILATERAL SALPINGOOPHORECTOMY     for heavy periods with appendectomy    Allergies  Allergen Reactions  . Penicillins Swelling    Has patient had a PCN reaction causing immediate rash, facial/tongue/throat swelling, SOB or lightheadedness with hypotension: No Has patient had a PCN reaction causing severe rash involving mucus membranes or skin necrosis: No Has patient had a PCN reaction that required hospitalization: No Has patient had a PCN reaction occurring within the last 10 years: No If all of the above answers are "NO", then may proceed with Cephalosporin use.  Marland Kitchen Lisinopril Cough  . Tape Hives  . Doxycycline Hives, Swelling and Rash  . Latex Hives, Itching and Rash    Current Outpatient Medications  Medication Sig Dispense Refill  . acetaminophen (TYLENOL) 325 MG tablet Take 2 tablets (650 mg total) by mouth every 6 (six) hours as needed for mild pain (or Fever >/= 101).    Marland Kitchen amLODipine (NORVASC) 5 MG tablet Take 1 tablet (5 mg total) by mouth daily. 90 tablet 3  . Blood Glucose Monitoring Suppl (ACCU-CHEK GUIDE) w/Device KIT 1 kit by Does not apply route as directed. 1 kit 0  . clopidogrel (PLAVIX) 75 MG tablet Take 75 mg by mouth.  1  . diphenoxylate-atropine (LOMOTIL) 2.5-0.025 MG tablet Take 1 tablet by mouth twice daily as needed for diarrhea. 50 tablet 2  . Dulaglutide (TRULICITY) 1.5 ZO/1.0RU SOPN Inject 1.5 mg into the skin once a week. 4 pen 1  . furosemide (LASIX) 40 MG tablet Take 1 tablet (40 mg total) by mouth daily. 90 tablet 3  . glucose blood (ACCU-CHEK GUIDE) test strip Use to check blood sugars 3 times daily 100 each 12  . insulin aspart (NOVOLOG) 100 UNIT/ML injection Inject 0-9 Units into the skin 3 (three) times daily with meals. 10 mL 11  . Insulin Pen Needle (BD PEN NEEDLE NANO U/F) 32G X 4 MM MISC USE TO INJECT INSULIN AS DIRECTED 100 each 0  . latanoprost (XALATAN)  0.005 % ophthalmic solution     . lipase/protease/amylase (CREON) 36000 UNITS CPEP capsule Take 1 capsule (36,000 Units total) by mouth 3 (three) times daily with meals. 180 capsule 3  . loperamide (IMODIUM A-D) 2 MG tablet Take 1-2 tablets (2-4 mg total) by mouth daily as needed for diarrhea or loose stools. 30 tablet 1  . loperamide (IMODIUM A-D) 2 MG tablet Take 1 tablet by mouth twice daily for diarrhea. 50 tablet 2  . losartan (COZAAR) 100 MG tablet Take 100 mg by mouth daily.  0  . mirtazapine (REMERON) 7.5 MG tablet TAKE 1 TABLET(7.5 MG) BY MOUTH AT BEDTIME 90 tablet 1  . ondansetron (ZOFRAN ODT) 4 MG disintegrating tablet Take 1 tablet (4 mg total) by mouth every 8 (eight) hours as needed. (Patient taking differently: Take 4 mg by mouth every 8 (eight) hours as needed for nausea or vomiting. ) 10 tablet 0  . ONETOUCH VERIO test strip USE TO TEST BLOOD SUGAR THREE TIMES DAILY 300 each 2  . promethazine (PHENERGAN) 25 MG tablet Take 0.5 tablets (12.5 mg total) by mouth every 6 (six) hours as needed for nausea or vomiting. 30 tablet 1  . rosuvastatin (CRESTOR) 20 MG tablet TAKE 1 TABLET(20 MG) BY MOUTH DAILY 90 tablet 3  . ruxolitinib phosphate (JAKAFI) 25 MG tablet Take 1 tablet (25 mg total) by mouth 2 (two) times daily. 60 tablet 6  . spironolactone (ALDACTONE) 50 MG tablet Take 2 tablets (100 mg total) by mouth daily. 60 tablet 6  . triamcinolone ointment (KENALOG) 0.5 % Apply 1 application topically 2 (two) times daily. 30 g 0  . ezetimibe (ZETIA) 10 MG tablet Take 1 tablet (10 mg total) by mouth daily. 90 tablet 3   No current facility-administered medications for this visit.     Social History   Socioeconomic History  . Marital status: Married    Spouse name: Not on file  . Number of children: 2  . Years of education: 2 yr colle  . Highest education level: Not on file  Occupational History  . Occupation: housewife  Social Needs  . Financial resource strain: Not on file  .  Food insecurity:    Worry: Not on  file    Inability: Not on file  . Transportation needs:    Medical: Not on file    Non-medical: Not on file  Tobacco Use  . Smoking status: Former Smoker    Years: 48.00    Types: Cigarettes    Last attempt to quit: 10/18/1986    Years since quitting: 31.9  . Smokeless tobacco: Never Used  Substance and Sexual Activity  . Alcohol use: No    Alcohol/week: 0.0 standard drinks  . Drug use: No  . Sexual activity: Yes    Partners: Male    Birth control/protection: None  Lifestyle  . Physical activity:    Days per week: Not on file    Minutes per session: Not on file  . Stress: Not on file  Relationships  . Social connections:    Talks on phone: Not on file    Gets together: Not on file    Attends religious service: Not on file    Active member of club or organization: Not on file    Attends meetings of clubs or organizations: Not on file    Relationship status: Not on file  . Intimate partner violence:    Fear of current or ex partner: Not on file    Emotionally abused: Not on file    Physically abused: Not on file    Forced sexual activity: Not on file  Other Topics Concern  . Not on file  Social History Narrative   2 caffeine drink per day.  No regular exercise.     Retired from the bank   2 children (Daughter and a son) son has morbid obesity   2 grandchildren   3 great grandchildren    She is  originally from [place], [place]  Family History  Problem Relation Age of Onset  . Diabetes Mother   . Heart disease Mother        CAD  . Hyperlipidemia Mother   . Hypertension Mother   . Kidney disease Mother   . Kidney disease Father   . Breast cancer Maternal Aunt   . Heart disease Maternal Grandmother   . Heart disease Other        maternal aunts and uncles    ROS General: Negative; No fevers, chills, or night sweats HEENT: Negative; No changes in vision or hearing, sinus congestion, difficulty swallowing Pulmonary:  Negative; No cough, wheezing, shortness of breath, hemoptysis Cardiovascular: See HPI: No chest pain, presyncope, syncope, palpatations Positive lower extremity edema GI: Positive for abdominal bloating and occasional to frequent diarrhea. GU: Negative; No dysuria, hematuria, or difficulty voiding Musculoskeletal: Negative; no myalgias, joint pain, or weakness Hematologic: Positive for polycythemia vera and anemia and leukocytosis. Endocrine: Negative; no heat/cold intolerance; no diabetes, Neuro: Possible mini stroke. Skin: Negative; No rashes or skin lesions Psychiatric: Negative; No behavioral problems, depression Sleep: Positive for snoring, nonrestorative sleep,daytime sleepiness, and hypersomnolence; no bruxism, restless legs, hypnogognic hallucinations. Other comprehensive 14 point system review is negative   Physical Exam BP (!) 164/73   Pulse 80   Ht _0  (1.626 m)   Wt 228 lb (103.4 kg)   BMI 39.14 kg/m    Repeat blood pressure by me was 148/76  Wt Readings from Last 3 Encounters:  09/19/18 228 lb (103.4 kg)  08/25/18 220 lb 6.4 oz (100 kg)  08/16/18 206 lb 14.4 oz (93.8 kg)   General: Alert, oriented, no distress.  Skin: normal turgor, no rashes, warm and dry HEENT: Normocephalic, atraumatic.  Pupils equal round and reactive to light; sclera anicteric; extraocular muscles intact;  Nose without nasal septal hypertrophy Mouth/Parynx benign; Mallinpatti scale 3 Neck: No JVD, no carotid bruits; normal carotid upstroke Lungs: clear to ausculatation and percussion; no wheezing or rales Chest wall: without tenderness to palpitation Heart: PMI not displaced, RRR, s1 s2 normal, 1/6 systolic murmur, no diastolic murmur, no rubs, gallops, thrills, or heaves Abdomen: soft, nontender; no hepatosplenomehaly, BS+; abdominal aorta nontender and not dilated by palpation. Back: no CVA tenderness Pulses 2+ Musculoskeletal: full range of motion, normal strength, no joint  deformities Extremities: 2+ lower extremity bilateral edema; no clubbing cyanosis, Homan's sign negative  Neurologic: grossly nonfocal; Cranial nerves grossly wnl Psychologic: Normal mood and affect   ECG (independently read by me): Normal sinus rhythm with an isolated PVC.  QTc interval 468 ms.  October 2018 ECG (independently read by me): Sinus rhythm at 82 bpm with 1 PVC.  QTC 476 ms.  June 2017 ECG (independently read by me): Normal sinus rhythm 80 bpm.  No ECG evidence for prior MI.  QTC interval 463 ms.  July 2016 ECG (independently read by me):  Normal sinus rhythm at 96 bpm. No significant ST changes.  No ECG evidence of prior MI.  LABS:  BMP Latest Ref Rng & Units 08/30/2018 08/25/2018 08/22/2018  Glucose 70 - 99 mg/dL 118(H) 170(H) 173(H)  BUN 6 - 23 mg/dL 24(H) 33(H) 33(H)  Creatinine 0.40 - 1.20 mg/dL 1.31(H) 1.60(H) 1.48(H)  BUN/Creat Ratio 12 - 28 - - -  Sodium 135 - 145 mEq/L 141 139 140  Potassium 3.5 - 5.1 mEq/L 4.3 4.7 4.3  Chloride 96 - 112 mEq/L 109 112(H) 106  CO2 19 - 32 mEq/L _0 Calcium 8.4 - 10.5 mg/dL 9.7 9.5 9.7    Hepatic Function Latest Ref Rng & Units 08/25/2018 08/15/2018 07/14/2018  Total Protein 6.4 - 8.1 g/dL 7.0 7.1 6.7  Albumin 3.5 - 5.0 g/dL 3.9 4.4 3.6  AST 11 - 38 U/L 50(H) 38(H) 37  ALT 10 - 47 U/L _1 Alk Phosphatase 26 - 84 U/L 109(H) 127(H) 147(H)  Total Bilirubin 0.2 - 1.6 mg/dL 1.1 0.9 1.1  Bilirubin, Direct 0.0 - 0.2 mg/dL - - -   CBC Latest Ref Rng & Units 08/25/2018 08/15/2018 07/14/2018  WBC 4.0 - 10.5 K/uL 12.3(H) 15.1(H) 16.1(H)  Hemoglobin 12.0 - 15.0 g/dL 10.0(L) 10.4(L) 9.5(L)  Hematocrit 36.0 - 46.0 % 33.0(L) 32.2(L) 31.1(L)  Platelets 150 - 400 K/uL 321 353.0 277   Lab Results  Component Value Date   MCV 85.1 08/25/2018   MCV 82.6 08/15/2018   MCV 84.5 07/14/2018    Lab Results  Component Value Date   TSH 3.973 05/23/2018    BNP    Component Value Date/Time   BNP 675.6 (H) 03/23/2018 0221   BNP  56.3 01/26/2016 1340    ProBNP    Component Value Date/Time   PROBNP 311.6 (H) 01/29/2014 1203     Lipid Panel     Component Value Date/Time   CHOL 167 03/23/2018 0221   TRIG 247 (H) 03/23/2018 0221   HDL 25 (L) 03/23/2018 0221   CHOLHDL 6.7 03/23/2018 0221   VLDL 49 (H) 03/23/2018 0221   LDLCALC 93 03/23/2018 0221   LDLDIRECT 103.0 02/14/2017 1122     RADIOLOGY: No results found.  IMPRESSION:  1. Coronary artery disease involving native coronary artery of native heart without angina pectoris   2. Hyperlipidemia LDL  goal <70   3. Essential hypertension   4. Bilateral lower extremity edema   5. Type 2 diabetes mellitus with complication, with long-term current use of insulin ([place])   6. [name] [facility])     ASSESSMENT AND PLAN: Ms Chea Malan is a 77 year old Caucasian female who is originally from [place] who presented on 01/27/2015 with a left circumflex STEMI.  She most likely had some spontaneous reperfusion which led to transient idioventricular rhythm.  She underwent successful intervention.  Her echo Doppler study 2 days later showed an EF of 50-55% with fairly normal wall motion.  When I initially saw her in the office she was not well beta blocked and further titrated her beta blocker to metoprolol 100 mg twice a day.  I have not seen her since October 2018.  Her blood pressure today is elevated on amlodipine 10 mg, spironolactone 100 mg, losartan 100 mg and furosemide 20 mg.  She has significant 2+ bilateral edema.  This may be contributed by her amlodipine and I have recommended reduction of her amlodipine dose down to 5 mg.  I have recommended further titration of Lasix to 40 mg daily.  I have also recommended support stockings with at least 15 to 20 mm pressure support.  She has been on simvastatin 20 mg for hyperlipidemia.  In June 2019 LDL cholesterol was 93.  I reviewed with her the Repatha trial data.  Target LDL is less than 70.  I have  recommended the addition of Zetia 10 mg to her medical regimen and if this is unsuccessful in achieving her goal further titration of Crestor to 40 mg will be done and if necessary she may be a candidate for PCSK9 inhibition.  Reviewed her June hospitalization and apparently she was felt to have possible mild stroke.  Her ambulation has improved.  She is diabetic on dulaglutide in addition to insulin.  She is on Plavix 75 mg daily.  She is followed by Dr. [name] for polycythemia vera continues to be on Jakafi.  I have recommended follow-up laboratory in 2 months with plans for follow-up office visit in 3 months.    Presently, her pulse is in the 80s.  She has stable blood pressure on repeat by me on amlodipine 5 mg, losartan 100 mg.  She had developed renal insufficiency while in [place] but apparently this resolved.  She continues to be on rosuvastatin 20 mg for hyperlipidemia with target LDL less than 70.  She is on [facility] for polycythemia vera and followed by Dr. [name].  She is diabetic on insulin and invokana.  She is morbidly obese.  Weight loss is recommended.  She was recently evaluated by a liver specialist and was not told of any major abnormality.  In the past.  I discussed a sleep study.  It does not appear that this was done.  A sleep study is done.  I will see her in follow-up otherwise will see her in one year for reevaluation.  Time spent: 25 minutes  [name], MD, [facility]  09/21/2018 5:33 PM

## 2018-09-19 NOTE — Telephone Encounter (Signed)
Spoke with patient's husband. Explained med changes made per today's visit. No further assistance needed

## 2018-09-19 NOTE — Telephone Encounter (Signed)
New message   Patient's husband has a question about today's visit about the changes made to patient's drug list. Please advise.

## 2018-09-21 ENCOUNTER — Encounter: Payer: Self-pay | Admitting: Cardiovascular Disease

## 2018-09-26 ENCOUNTER — Ambulatory Visit: Payer: PPO | Admitting: Internal Medicine

## 2018-09-27 ENCOUNTER — Encounter: Payer: Self-pay | Admitting: Family Medicine

## 2018-09-27 ENCOUNTER — Ambulatory Visit (INDEPENDENT_AMBULATORY_CARE_PROVIDER_SITE_OTHER): Payer: PPO | Admitting: Family Medicine

## 2018-09-27 VITALS — BP 140/68 | HR 83 | Temp 97.7°F | Resp 20 | Ht 64.0 in | Wt 223.5 lb

## 2018-09-27 DIAGNOSIS — R05 Cough: Secondary | ICD-10-CM

## 2018-09-27 DIAGNOSIS — J069 Acute upper respiratory infection, unspecified: Secondary | ICD-10-CM | POA: Diagnosis not present

## 2018-09-27 DIAGNOSIS — H6983 Other specified disorders of Eustachian tube, bilateral: Secondary | ICD-10-CM | POA: Diagnosis not present

## 2018-09-27 DIAGNOSIS — R059 Cough, unspecified: Secondary | ICD-10-CM

## 2018-09-27 MED ORDER — BENZONATATE 100 MG PO CAPS: 100.0000 mg | ORAL_CAPSULE | Freq: Two times a day (BID) | ORAL | 0 refills | Status: DC | PRN

## 2018-09-27 MED ORDER — HYDROCODONE-HOMATROPINE 5-1.5 MG/5ML PO SYRP: 5.0000 mL | ORAL_SOLUTION | Freq: Two times a day (BID) | ORAL | 0 refills | Status: DC | PRN

## 2018-09-27 MED ORDER — FLUTICASONE PROPIONATE 50 MCG/ACT NA SUSP: 1.0000 | Freq: Two times a day (BID) | NASAL | 3 refills | Status: DC

## 2018-09-27 NOTE — Patient Instructions (Addendum)
  [name] I have seen you today for an acute visit.  A few things to remember from today's visit:   URI, acute - Plan: fluticasone (FLONASE) 50 MCG/ACT nasal spray  Cough - Plan: HYDROcodone-homatropine (HYCODAN) 5-1.5 MG/5ML syrup, benzonatate (TESSALON) 100 MG capsule  Dysfunction of both eustachian tubes - Plan: fluticasone (FLONASE) 50 MCG/ACT nasal spray Nasal irrigation with saline several times per day may help. Monitor for fever, worsening shortness of breath, wheezing, or other worrisome symptom. Please follow-up with your PCP in 1 week, before if needed.  Hycodan syrup to take mainly at night to treat cough and therefore to help with sleep.    If medications prescribed today, they will not be refill upon request, a follow up appointment with PCP will be necessary to discuss continuation of of treatment if appropriate.     In general please monitor for signs of worsening symptoms and seek immediate medical attention if any concerning.  If symptoms are not resolved in a few days/weeks you should schedule a follow up appointment with your doctor, before if needed.  I hope you get better soon!

## 2018-09-27 NOTE — Progress Notes (Signed)
ACUTE VISIT  HPI:  Chief Complaint  Patient presents with  . Cough    with white phlem, sx started last week Thursday  . Nasal Congestion  . Chest congestion    [name] is a 77 y.o.female with Hx of CHF, HTN,and DM II is here today with her husband complaining of 6 days of respiratory symptoms. "Severe" productive cough with whitish sputum. Cough is worse at night when lying down,interfering with sleep. Denies orthopnea or PND.  Intermittent ear fullness sensation,rhinorrhea,and nasal congestion. Negative for sore throat, earache or drainage.  Denies hemoptysis,wheezing,dyspnea,or chest pain.  She has not noted fever, has had some chills.   URI   This is a new problem. The current episode started in the past 7 days. The problem has been unchanged. There has been no fever. Associated symptoms include congestion, coughing, a plugged ear sensation and rhinorrhea. Pertinent negatives include no abdominal pain, chest pain, diarrhea, dysuria, ear pain, headaches, nausea, sinus pain, sore throat, swollen glands, vomiting or wheezing. She has tried acetaminophen for the symptoms. The treatment provided mild relief.   No Hx of recent travel. No sick contact. No known insect bite. Former smoker.   OTC medications for this problem: Tylenol cold and sinus.  Review of Systems  Constitutional: Positive for activity change, appetite change, chills and fatigue. Negative for fever.  HENT: Positive for congestion, postnasal drip and rhinorrhea. Negative for ear pain, sinus pain and sore throat.   Respiratory: Positive for cough. Negative for wheezing.   Cardiovascular: Negative for chest pain.  Gastrointestinal: Negative for abdominal pain, diarrhea, nausea and vomiting.  Genitourinary: Negative for decreased urine volume, dysuria and hematuria.  Neurological: Negative for syncope, weakness and headaches.  Psychiatric/Behavioral: Positive for sleep disturbance. Negative  for confusion.      Current Outpatient Medications on File Prior to Visit  Medication Sig Dispense Refill  . acetaminophen (TYLENOL) 325 MG tablet Take 2 tablets (650 mg total) by mouth every 6 (six) hours as needed for mild pain (or Fever >/= 101).    Marland Kitchen amLODipine (NORVASC) 5 MG tablet Take 1 tablet (5 mg total) by mouth daily. 90 tablet 3  . Blood Glucose Monitoring Suppl (ACCU-CHEK GUIDE) w/Device KIT 1 kit by Does not apply route as directed. 1 kit 0  . clopidogrel (PLAVIX) 75 MG tablet Take 75 mg by mouth.  1  . diphenoxylate-atropine (LOMOTIL) 2.5-0.025 MG tablet Take 1 tablet by mouth twice daily as needed for diarrhea. 50 tablet 2  . Dulaglutide (TRULICITY) 1.5 DJ/4.9FW SOPN Inject 1.5 mg into the skin once a week. 4 pen 1  . ezetimibe (ZETIA) 10 MG tablet Take 1 tablet (10 mg total) by mouth daily. 90 tablet 3  . furosemide (LASIX) 40 MG tablet Take 1 tablet (40 mg total) by mouth daily. 90 tablet 3  . glucose blood (ACCU-CHEK GUIDE) test strip Use to check blood sugars 3 times daily 100 each 12  . insulin aspart (NOVOLOG) 100 UNIT/ML injection Inject 0-9 Units into the skin 3 (three) times daily with meals. 10 mL 11  . Insulin Pen Needle (BD PEN NEEDLE NANO U/F) 32G X 4 MM MISC USE TO INJECT INSULIN AS DIRECTED 100 each 0  . latanoprost (XALATAN) 0.005 % ophthalmic solution     . lipase/protease/amylase (CREON) 36000 UNITS CPEP capsule Take 1 capsule (36,000 Units total) by mouth 3 (three) times daily with meals. 180 capsule 3  . loperamide (IMODIUM A-D) 2 MG tablet  Take 1-2 tablets (2-4 mg total) by mouth daily as needed for diarrhea or loose stools. 30 tablet 1  . loperamide (IMODIUM A-D) 2 MG tablet Take 1 tablet by mouth twice daily for diarrhea. 50 tablet 2  . losartan (COZAAR) 100 MG tablet Take 100 mg by mouth daily.  0  . mirtazapine (REMERON) 7.5 MG tablet TAKE 1 TABLET(7.5 MG) BY MOUTH AT BEDTIME 90 tablet 1  . ondansetron (ZOFRAN ODT) 4 MG disintegrating tablet Take 1  tablet (4 mg total) by mouth every 8 (eight) hours as needed. (Patient taking differently: Take 4 mg by mouth every 8 (eight) hours as needed for nausea or vomiting. ) 10 tablet 0  . ONETOUCH VERIO test strip USE TO TEST BLOOD SUGAR THREE TIMES DAILY 300 each 2  . promethazine (PHENERGAN) 25 MG tablet Take 0.5 tablets (12.5 mg total) by mouth every 6 (six) hours as needed for nausea or vomiting. 30 tablet 1  . rosuvastatin (CRESTOR) 20 MG tablet TAKE 1 TABLET(20 MG) BY MOUTH DAILY 90 tablet 3  . ruxolitinib phosphate (JAKAFI) 25 MG tablet Take 1 tablet (25 mg total) by mouth 2 (two) times daily. 60 tablet 6  . spironolactone (ALDACTONE) 50 MG tablet Take 2 tablets (100 mg total) by mouth daily. 60 tablet 6  . triamcinolone ointment (KENALOG) 0.5 % Apply 1 application topically 2 (two) times daily. 30 g 0   No current facility-administered medications on file prior to visit.      Past Medical History:  Diagnosis Date  . Allergic rhinitis 01/23/2016  . C. difficile colitis   . CAD (coronary artery disease)    a. s/p STEMI in 01/2015 with 95% LCx stenosis and distal 80% LCx stenosis (DESx2 placed)  . Cancer ([place])    SKIN  . Candidiasis of skin 09/30/2014  . Cirrhosis ([place])   . Depression   . Diabetes mellitus 2008  . Gout   . Herpes simplex   . Hyperglycemia 05/31/2013  . Hyperlipidemia   . Hypertension   . Myocardial infarction ([place])   . 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 Obesity   . Polycythemia    Dr. Elease Hashimoto- [facility]  . Psoriasis    Allergies  Allergen Reactions  . Penicillins Swelling    Has patient had a PCN reaction causing immediate rash, facial/tongue/throat swelling, SOB or lightheadedness with hypotension: No Has patient had a PCN reaction causing severe rash involving mucus membranes or skin necrosis: No Has patient had a PCN reaction that required hospitalization: No Has patient had a PCN reaction occurring within the last 10 years: No If all of  the above answers are "NO", then may proceed with Cephalosporin use.  Marland Kitchen Lisinopril Cough  . Tape Hives  . Doxycycline Hives, Swelling and Rash  . Latex Hives, Itching and Rash    Social History   Socioeconomic History  . Marital status: Married    Spouse name: Not on file  . Number of children: 2  . Years of education: 2 yr colle  . Highest education level: Not on file  Occupational History  . Occupation: housewife  Social Needs  . Financial resource strain: Not on file  . Food insecurity:    Worry: Not on file    Inability: Not on file  . Transportation needs:    Medical: Not on file    Non-medical: Not on file  Tobacco Use  . Smoking status: Former Smoker    Years: 48.00  Types: Cigarettes    Last attempt to quit: 10/18/1986    Years since quitting: 31.9  . Smokeless tobacco: Never Used  Substance and Sexual Activity  . Alcohol use: No    Alcohol/week: 0.0 standard drinks  . Drug use: No  . Sexual activity: Yes    Partners: Male    Birth control/protection: None  Lifestyle  . Physical activity:    Days per week: Not on file    Minutes per session: Not on file  . Stress: Not on file  Relationships  . Social connections:    Talks on phone: Not on file    Gets together: Not on file    Attends religious service: Not on file    Active member of club or organization: Not on file    Attends meetings of clubs or organizations: Not on file    Relationship status: Not on file  Other Topics Concern  . Not on file  Social History Narrative   2 caffeine drink per day.  No regular exercise.     Retired from the bank   2 children (Daughter and a son) son has morbid obesity   2 grandchildren   3 great grandchildren    Vitals:   09/27/18 0904  BP: 140/68  Pulse: 83  Resp: 20  Temp: 97.7 F (36.5 C)  SpO2: 95%   Body mass index is 38.36 kg/m.   Physical Exam  Nursing note and vitals reviewed. Constitutional: She is oriented to person, place, and time. She  appears well-developed. She does not appear ill. No distress.  HENT:  Head: Normocephalic and atraumatic.  Right Ear: Tympanic membrane, external ear and ear canal normal.  Left Ear: Tympanic membrane, external ear and ear canal normal.  Nose: Rhinorrhea present. Right sinus exhibits no maxillary sinus tenderness and no frontal sinus tenderness. Left sinus exhibits no maxillary sinus tenderness and no frontal sinus tenderness.  Mouth/Throat: Oropharynx is clear and moist and mucous membranes are normal.  Eyes: Conjunctivae are normal.  Neck: No muscular tenderness present. No edema and no erythema present.  Cardiovascular: Normal rate and regular rhythm.  Murmur (SEM I/VI RUSB) heard. Respiratory: Effort normal and breath sounds normal. No stridor. No respiratory distress.  Lymphadenopathy:       Head (right side): No submandibular adenopathy present.       Head (left side): No submandibular adenopathy present.    She has no cervical adenopathy.  Neurological: She is alert and oriented to person, place, and time. She has normal strength.  Skin: Skin is warm. No rash noted. No erythema.  Psychiatric: Her mood appears anxious.  Well groomed, good eye contact.      ASSESSMENT AND PLAN:  Ms. Lachina was seen today for cough, nasal congestion and chest congestion.  Diagnoses and all orders for this visit:  URI, acute Symptoms suggests a viral etiology. Symptomatic treatment recommended. Instructed to monitor for signs of complications, including new onset of fever among some, clearly instructed about warning signs. I also explained that cough and nasal congestion can last a few days and sometimes weeks. F/U as needed.   -     fluticasone (FLONASE) 50 MCG/ACT nasal spray; Place 1 spray into both nostrils 2 (two) times daily.  Cough Lung auscultation is negative. She does not want CXR done today. Side effects of Hydrocodone discussed.  -     HYDROcodone-homatropine (HYCODAN) 5-1.5  MG/5ML syrup; Take 5 mLs by mouth 3 times/day as needed-between meals & bedtime  for up to 10 days for cough. -     benzonatate (TESSALON) 100 MG capsule; Take 1 capsule (100 mg total) by mouth 2 (two) times daily as needed for up to 10 days.  Dysfunction of both eustachian tubes Because PMx recommend avoiding cold meds. Auto inflation maneuvers and intranasal Flonase may help.  -     fluticasone (FLONASE) 50 MCG/ACT nasal spray; Place 1 spray into both nostrils 2 (two) times daily.         Betty G. Martinique, MD  [facility]. [place] office.

## 2018-09-29 ENCOUNTER — Ambulatory Visit (INDEPENDENT_AMBULATORY_CARE_PROVIDER_SITE_OTHER): Payer: PPO | Admitting: Gastroenterology

## 2018-09-29 ENCOUNTER — Other Ambulatory Visit (INDEPENDENT_AMBULATORY_CARE_PROVIDER_SITE_OTHER): Payer: PPO

## 2018-09-29 ENCOUNTER — Encounter: Payer: Self-pay | Admitting: Gastroenterology

## 2018-09-29 VITALS — BP 142/68 | HR 82 | Ht 64.5 in | Wt 225.2 lb

## 2018-09-29 DIAGNOSIS — K7460 Unspecified cirrhosis of liver: Secondary | ICD-10-CM

## 2018-09-29 LAB — CBC WITH DIFFERENTIAL/PLATELET
Basophils Absolute: 0.2 10*3/uL — ABNORMAL HIGH (ref 0.0–0.1)
Basophils Relative: 0.9 % (ref 0.0–3.0)
Eosinophils Absolute: 0.2 10*3/uL (ref 0.0–0.7)
Eosinophils Relative: 1 % (ref 0.0–5.0)
HCT: 34.2 % — ABNORMAL LOW (ref 36.0–46.0)
Hemoglobin: 11.3 g/dL — ABNORMAL LOW (ref 12.0–15.0)
Lymphocytes Relative: 14.5 % (ref 12.0–46.0)
Lymphs Abs: 2.6 10*3/uL (ref 0.7–4.0)
MCHC: 33 g/dL (ref 30.0–36.0)
MCV: 86.7 fl (ref 78.0–100.0)
Monocytes Absolute: 1.2 10*3/uL — ABNORMAL HIGH (ref 0.1–1.0)
Monocytes Relative: 6.7 % (ref 3.0–12.0)
NEUTROS PCT: 76.9 % (ref 43.0–77.0)
Neutro Abs: 13.6 10*3/uL — ABNORMAL HIGH (ref 1.4–7.7)
Platelets: 288 10*3/uL (ref 150.0–400.0)
RBC: 3.95 Mil/uL (ref 3.87–5.11)
RDW: 25.3 % — ABNORMAL HIGH (ref 11.5–15.5)
WBC: 17.7 10*3/uL — ABNORMAL HIGH (ref 4.0–10.5)

## 2018-09-29 LAB — PROTIME-INR
INR: 1.2 ratio — ABNORMAL HIGH (ref 0.8–1.0)
Prothrombin Time: 14.1 s — ABNORMAL HIGH (ref 9.6–13.1)

## 2018-09-29 LAB — COMPREHENSIVE METABOLIC PANEL
ALBUMIN: 4.4 g/dL (ref 3.5–5.2)
ALT: 14 U/L (ref 0–35)
AST: 33 U/L (ref 0–37)
Alkaline Phosphatase: 94 U/L (ref 39–117)
BUN: 24 mg/dL — AB (ref 6–23)
CALCIUM: 8.9 mg/dL (ref 8.4–10.5)
CO2: 23 mEq/L (ref 19–32)
Chloride: 107 mEq/L (ref 96–112)
Creatinine, Ser: 1.19 mg/dL (ref 0.40–1.20)
GFR: 46.5 mL/min — ABNORMAL LOW (ref >60.00)
Glucose, Bld: 156 mg/dL — ABNORMAL HIGH (ref 70–99)
Potassium: 3.7 mEq/L (ref 3.5–5.1)
Sodium: 141 mEq/L (ref 135–145)
Total Bilirubin: 0.8 mg/dL (ref 0.2–1.2)
Total Protein: 7 g/dL (ref 6.0–8.3)

## 2018-09-29 NOTE — Progress Notes (Signed)
Review of pertinent gastrointestinal problems: 1. 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  AFP 08/2014 normal... 01/2018 4.7 (normal)  EGD 02/2014 no portal hypertensive changes; + irregular z line, biopsies showed no IM  Immunized for hep a/b 2015  Ascites managed by  2. C. Diff colitis: diarrhea following abx for buttocks abscess 2015; C. Diff PCR +, C. Diff toxin A/B positive; treated with flagyl, vanc    HPI: This is a very pleasant 77 year old woman whom I last saw over a year or 2 ago.  She has decompensated cirrhosis.  Her last visit here was about a month ago.  She is getting a lot of her care with Amy in the past year or so.  Chief complaint is decompensated cirrhosis  Her weight is up about 20 pounds since her last visit 2 months ago.  At that visit her diuretics were decreased slightly.  Her Lasix was changed to 40 mg every morning and her Aldactone was continued at 100 mg every morning.  Reviewing her records it looks like there is been a bit of confusion between her and her husband about her medicine dosings.  Her most recent basic metabolic profile was 1 month ago and her BUN was 24, her creatinine was 1.3.  Sodium was normal.  Baseline creatinine is 1  Per her husband and her she has been taking Lasix 78m once daily, aldactone 511monce daily. ((not what was recommended by [facility]))  Avoiding salt in diet at least as best that she can.  Her husband does the cooking.  ROS: complete GI ROS as described in HPI, all other review negative.  Constitutional:  No unintentional weight loss   Past Medical History:   Diagnosis Date  . Allergic rhinitis 01/23/2016  . C. difficile colitis   . CAD (coronary artery disease)    a. s/p STEMI in 01/2015 with 95% LCx stenosis and distal 80% LCx stenosis (DESx2 placed)  . Cancer ([place]   SKIN  . Candidiasis of skin 09/30/2014  . Cirrhosis ([facility]  . Depression   . Diabetes mellitus 2008  . Gout   . Herpes simplex   . Hyperglycemia 05/31/2013  . Hyperlipidemia   . Hypertension   . [place] [place]  . [place]romuscular disorder ([place]   BELL PALSY  . Obesity   . Polycythemia    Dr. [facility]Elease Hashimoto[facility]  . Psoriasis     Past Surgical History:  Procedure Laterality Date  . BIOPSY  05/26/2018   Procedure: BIOPSY;  Surgeon: GuJackquline DenmarkMD;  [facility];  Service: Endoscopy;;  . BREAST SURGERY Left    milk duct  . CATARACT EXTRACTION, BILATERAL    . COLONOSCOPY    . ESOPHAGOGASTRODUODENOSCOPY N/A 05/26/2018   Procedure: ESOPHAGOGASTRODUODENOSCOPY (EGD);  Surgeon: GuJackquline DenmarkMD;  [facility];  Service: Endoscopy;  Laterality: N/A;  . IR PARACENTESIS  05/25/2018  . LEFT HEART CATHETERIZATION WITH CORONARY ANGIOGRAM N/A 01/27/2015   Procedure: LEFT HEART CATHETERIZATION WITH CORONARY ANGIOGRAM;  Surgeon: ThTroy SineMD;  [facility];  Service: Cardiovascular;  Laterality: N/A;  . TONSILLECTOMY    . TOTAL ABDOMINAL HYSTERECTOMY W/ BILATERAL  SALPINGOOPHORECTOMY     for heavy periods with appendectomy    Current Outpatient Medications  Medication Sig Dispense Refill  . acetaminophen (TYLENOL) 325 MG tablet Take 2 tablets (650 mg total) by mouth every 6 (six) hours as needed for mild pain (or Fever >/= 101).    Marland Kitchen amLODipine (NORVASC) 5 MG tablet Take 1 tablet (5 mg total) by mouth daily. 90 tablet 3  . benzonatate (TESSALON) 100 MG capsule Take 1 capsule (100 mg total) by mouth 2 (two) times daily as needed for up to 10 days. 20 capsule 0  . Blood Glucose Monitoring Suppl (ACCU-CHEK GUIDE) w/Device KIT 1 kit  by Does not apply route as directed. 1 kit 0  . clopidogrel (PLAVIX) 75 MG tablet Take 75 mg by mouth.  1  . diphenoxylate-atropine (LOMOTIL) 2.5-0.025 MG tablet Take 1 tablet by mouth twice daily as needed for diarrhea. 50 tablet 2  . Dulaglutide (TRULICITY) 1.5 ZG/0.1VC SOPN Inject 1.5 mg into the skin once a week. 4 pen 1  . ezetimibe (ZETIA) 10 MG tablet Take 1 tablet (10 mg total) by mouth daily. 90 tablet 3  . fluticasone (FLONASE) 50 MCG/ACT nasal spray Place 1 spray into both nostrils 2 (two) times daily. 16 g 3  . furosemide (LASIX) 40 MG tablet Take 1 tablet (40 mg total) by mouth daily. 90 tablet 3  . glucose blood (ACCU-CHEK GUIDE) test strip Use to check blood sugars 3 times daily 100 each 12  . HYDROcodone-homatropine (HYCODAN) 5-1.5 MG/5ML syrup Take 5 mLs by mouth 3 times/day as needed-between meals & bedtime for up to 10 days for cough. 100 mL 0  . insulin aspart (NOVOLOG) 100 UNIT/ML injection Inject 0-9 Units into the skin 3 (three) times daily with meals. 10 mL 11  . Insulin Pen Needle (BD PEN NEEDLE NANO U/F) 32G X 4 MM MISC USE TO INJECT INSULIN AS DIRECTED 100 each 0  . latanoprost (XALATAN) 0.005 % ophthalmic solution     . lipase/protease/amylase (CREON) 36000 UNITS CPEP capsule Take 1 capsule (36,000 Units total) by mouth 3 (three) times daily with meals. 180 capsule 3  . loperamide (IMODIUM A-D) 2 MG tablet Take 1-2 tablets (2-4 mg total) by mouth daily as needed for diarrhea or loose stools. 30 tablet 1  . loperamide (IMODIUM A-D) 2 MG tablet Take 1 tablet by mouth twice daily for diarrhea. 50 tablet 2  . losartan (COZAAR) 100 MG tablet Take 100 mg by mouth daily.  0  . mirtazapine (REMERON) 7.5 MG tablet TAKE 1 TABLET(7.5 MG) BY MOUTH AT BEDTIME 90 tablet 1  . ondansetron (ZOFRAN ODT) 4 MG disintegrating tablet Take 1 tablet (4 mg total) by mouth every 8 (eight) hours as needed. (Patient taking differently: Take 4 mg by mouth every 8 (eight) hours as needed for nausea or  vomiting. ) 10 tablet 0  . ONETOUCH VERIO test strip USE TO TEST BLOOD SUGAR THREE TIMES DAILY 300 each 2  . promethazine (PHENERGAN) 25 MG tablet Take 0.5 tablets (12.5 mg total) by mouth every 6 (six) hours as needed for nausea or vomiting. 30 tablet 1  . rosuvastatin (CRESTOR) 20 MG tablet TAKE 1 TABLET(20 MG) BY MOUTH DAILY 90 tablet 3  . ruxolitinib phosphate (JAKAFI) 25 MG tablet Take 1 tablet (25 mg total) by mouth 2 (two) times daily. 60 tablet 6  . spironolactone (ALDACTONE) 50 MG tablet Take 2 tablets (100 mg total) by mouth daily. 60 tablet 6  . triamcinolone ointment (  KENALOG) 0.5 % Apply 1 application topically 2 (two) times daily. 30 g 0   No current facility-administered medications for this visit.     Allergies as of 09/29/2018 - Review Complete 09/29/2018  Allergen Reaction Noted  . Penicillins Swelling 05/23/2018  . Lisinopril Cough 04/08/2015  . Tape Hives 01/28/2014  . Doxycycline Hives, Swelling, and Rash   . Latex Hives, Itching, and Rash 09/07/2011    Family History  Problem Relation Age of Onset  . Diabetes Mother   . Heart disease Mother        CAD  . Hyperlipidemia Mother   . Hypertension Mother   . Kidney disease Mother   . Kidney disease Father   . Breast cancer Maternal Aunt   . Heart disease Maternal Grandmother   . Heart disease Other        maternal aunts and uncles  . Colon cancer Neg Hx   . Esophageal cancer Neg Hx     Social History   Socioeconomic History  . Marital status: Married    Spouse name: Not on file  . Number of children: 2  . Years of education: 2 yr colle  . Highest education level: Not on file  Occupational History  . Occupation: housewife  Social Needs  . Financial resource strain: Not on file  . Food insecurity:    Worry: Not on file    Inability: Not on file  . Transportation needs:    Medical: Not on file    Non-medical: Not on file  Tobacco Use  . Smoking status: Former Smoker    Years: 48.00    Types:  Cigarettes    Last attempt to quit: 10/18/1986    Years since quitting: 31.9  . Smokeless tobacco: Never Used  Substance and Sexual Activity  . Alcohol use: No    Alcohol/week: 0.0 standard drinks  . Drug use: No  . Sexual activity: Yes    Partners: Male    Birth control/protection: None  Lifestyle  . Physical activity:    Days per week: Not on file    Minutes per session: Not on file  . Stress: Not on file  Relationships  . Social connections:    Talks on phone: Not on file    Gets together: Not on file    Attends religious service: Not on file    Active member of club or organization: Not on file    Attends meetings of clubs or organizations: Not on file    Relationship status: Not on file  . Intimate partner violence:    Fear of current or ex partner: Not on file    Emotionally abused: Not on file    Physically abused: Not on file    Forced sexual activity: Not on file  Other Topics Concern  . Not on file  Social History Narrative   2 caffeine drink per day.  No regular exercise.     Retired from the bank   2 children (Daughter and a son) son has morbid obesity   2 grandchildren   3 great grandchildren     Physical Exam: BP (!) 142/68   Pulse 82   Ht 5' 4.5" (1.638 m)   Wt 225 lb 4 oz (102.2 kg)   BMI 38.07 kg/m  Constitutional: generally well-appearing, obese, coughing Psychiatric: alert and oriented x3 Abdomen: soft, nontender, nondistended, no obvious ascites, no peritoneal signs, normal bowel sounds 1-2+ peripheral edema bilaterally in her ankles.  Assessment and plan:  77 y.o. female with decompensated cirrhosis  She still has some edema in her ankles and although she tells me her weight has been improving lately it is actually up 20 pounds since her last visit here.  She will get a repeat set of labs today including a c-Met, CBC, coags.  This will restage her liver disease, allow for meld score calculation, if there is room to increase her diuretics I will  do so.  Probably just by a slight amount such as increasing her Aldactone to 100 mg daily.  She needs to see me again in 2 to 3 months.  She will need repeat hepatoma screening from that visit.  Please see the "Patient Instructions" section for addition details about the plan.  Owens Loffler, MD [facility] 09/29/2018, 8:56 AM

## 2018-09-29 NOTE — Patient Instructions (Addendum)
You will have labs checked today in the basement lab.  Please head down after you check out with the front desk  (cmet, INR, cbc).  May adjust diuretics up a bit after that appt.  It is important that you have a relatively low salt diet.  High salt diet can cause fluid to accumulate in your legs, abdomen and even around your lungs. You should try to avoid NSAID type over the counter pain medicines as best as possible. Tylenol is safe to take for 'routine' aches and pains, but never take more than 1/2 the dose suggested on the package instructions (never more than 2 grams per day). Avoid alcohol.  Please return to see Dr. [name] in 2-3 months.  Will need hepatoma screening after that appt.  Your provider has requested that you go to the basement level for lab work before leaving today. Press "B" on the elevator. The lab is located at the first door on the left as you exit the elevator.  Thank you for entrusting me with your care and choosing [facility].  Dr [name]

## 2018-10-02 ENCOUNTER — Encounter: Payer: Self-pay | Admitting: Adult Health

## 2018-10-02 ENCOUNTER — Other Ambulatory Visit: Payer: Self-pay

## 2018-10-02 ENCOUNTER — Ambulatory Visit (INDEPENDENT_AMBULATORY_CARE_PROVIDER_SITE_OTHER): Payer: PPO | Admitting: Adult Health

## 2018-10-02 ENCOUNTER — Ambulatory Visit (INDEPENDENT_AMBULATORY_CARE_PROVIDER_SITE_OTHER): Payer: PPO

## 2018-10-02 ENCOUNTER — Other Ambulatory Visit: Payer: Self-pay | Admitting: Adult Health

## 2018-10-02 ENCOUNTER — Telehealth: Payer: Self-pay | Admitting: Adult Health

## 2018-10-02 VITALS — BP 170/74 | HR 93 | Temp 98.2°F | Wt 227.0 lb

## 2018-10-02 DIAGNOSIS — R0602 Shortness of breath: Secondary | ICD-10-CM | POA: Diagnosis not present

## 2018-10-02 DIAGNOSIS — R05 Cough: Secondary | ICD-10-CM

## 2018-10-02 DIAGNOSIS — K7460 Unspecified cirrhosis of liver: Secondary | ICD-10-CM

## 2018-10-02 DIAGNOSIS — R059 Cough, unspecified: Secondary | ICD-10-CM

## 2018-10-02 MED ORDER — AZITHROMYCIN 250 MG PO TABS: ORAL_TABLET | ORAL | 0 refills | Status: DC

## 2018-10-02 NOTE — Progress Notes (Signed)
Subjective:    Patient ID: Amanda [name], female    DOB: [birth year], 77 y.o.   MRN: [id number]  HPI 77 year old female who  has a past medical history of Allergic rhinitis (01/23/2016), C. difficile colitis, CAD (coronary artery disease), Cancer ([place]), Candidiasis of skin (09/30/2014), Cirrhosis ([place]), Depression, Diabetes mellitus (2008), Gout, Herpes simplex, Hyperglycemia (05/31/2013), Hyperlipidemia, Hypertension, Myocardial infarction [facility]), Neuromuscular disorder ([place]), Obesity, Polycythemia, and Psoriasis.  She presents to the office today for follow up after seeing another provider in the office five days ago for suspected URI. At this time she had a severe productive cough with whitish sputum. Cough was worse at night when lying down and was interfering with sleep.   At this time her symptoms suggested viral etiology. She was treated with Flonase, Hycodan cough syrup and tessalon pearls.   Today in the office she reports that she is feeling somewhat improved but she continues to have productive cough and episodes of shortness of breath. Tessalon pearls caused her to be nauseated so she stopped taking this. She did not use any of the hycodan cough syrup as she does not like taking liquid medications.   She denies fevers, chills, sinus pain or pressure.    Review of Systems See HPI   Past Medical History:  Diagnosis Date  . Allergic rhinitis 01/23/2016  . C. difficile colitis   . CAD (coronary artery disease)    a. s/p STEMI in 01/2015 with 95% LCx stenosis and distal 80% LCx stenosis (DESx2 placed)  . Cancer ([place])    SKIN  . Candidiasis of skin 09/30/2014  . Cirrhosis ([place])   . Depression   . Diabetes mellitus 2008  . Gout   . Herpes simplex   . Hyperglycemia 05/31/2013  . Hyperlipidemia   . Hypertension   . Myocardial infarction ([place])   . Neuromuscular disorder ([place])    BELL PALSY  . Obesity   . Polycythemia    Dr. Elease Hashimoto- [facility]  . Psoriasis     Social  History   Socioeconomic History  . Marital status: Married    Spouse name: Not on file  . Number of children: 2  . Years of education: 2 yr colle  . Highest education level: Not on file  Occupational History  . Occupation: housewife  Social Needs  . Financial resource strain: Not on file  . Food insecurity:    Worry: Not on file    Inability: Not on file  . Transportation needs:    Medical: Not on file    Non-medical: Not on file  Tobacco Use  . Smoking status: Former Smoker    Years: 48.00    Types: Cigarettes    Last attempt to quit: 10/18/1986    Years since quitting: 31.9  . Smokeless tobacco: Never Used  Substance and Sexual Activity  . Alcohol use: No    Alcohol/week: 0.0 standard drinks  . Drug use: No  . Sexual activity: Yes    Partners: Male    Birth control/protection: None  Lifestyle  . Physical activity:    Days per week: Not on file    Minutes per session: Not on file  . Stress: Not on file  Relationships  . Social connections:    Talks on phone: Not on file    Gets together: Not on file    Attends religious service: Not on file    Active member of club or organization: Not on file  Attends meetings of clubs or organizations: Not on file    Relationship status: Not on file  . Intimate partner violence:    Fear of current or ex partner: Not on file    Emotionally abused: Not on file    Physically abused: Not on file    Forced sexual activity: Not on file  Other Topics Concern  . Not on file  Social History Narrative   2 caffeine drink per day.  No regular exercise.     Retired from the bank   2 children (Daughter and a son) son has morbid obesity   2 grandchildren   3 great grandchildren    Past Surgical History:  Procedure Laterality Date  . BIOPSY  05/26/2018   Procedure: BIOPSY;  Surgeon: [name], MD;  Location: [facility] ENDOSCOPY;  Service: Endoscopy;;  . BREAST SURGERY Left    milk duct  . CATARACT EXTRACTION, BILATERAL    .  COLONOSCOPY    . ESOPHAGOGASTRODUODENOSCOPY N/A 05/26/2018   Procedure: ESOPHAGOGASTRODUODENOSCOPY (EGD);  Surgeon: [name], MD;  Location: [facility] ENDOSCOPY;  Service: Endoscopy;  Laterality: N/A;  . IR PARACENTESIS  05/25/2018  . LEFT HEART CATHETERIZATION WITH CORONARY ANGIOGRAM N/A 01/27/2015   Procedure: LEFT HEART CATHETERIZATION WITH CORONARY ANGIOGRAM;  Surgeon: [name], MD;  Location: [facility] CATH LAB;  Service: Cardiovascular;  Laterality: N/A;  . TONSILLECTOMY    . TOTAL ABDOMINAL HYSTERECTOMY W/ BILATERAL SALPINGOOPHORECTOMY     for heavy periods with appendectomy    Family History  Problem Relation Age of Onset  . Diabetes Mother   . Heart disease Mother        CAD  . Hyperlipidemia Mother   . Hypertension Mother   . Kidney disease Mother   . Kidney disease Father   . Breast cancer Maternal Aunt   . Heart disease Maternal Grandmother   . Heart disease Other        maternal aunts and uncles  . Colon cancer Neg Hx   . Esophageal cancer Neg Hx     Allergies  Allergen Reactions  . Penicillins Swelling    Has patient had a PCN reaction causing immediate rash, facial/tongue/throat swelling, SOB or lightheadedness with hypotension: No Has patient had a PCN reaction causing severe rash involving mucus membranes or skin necrosis: No Has patient had a PCN reaction that required hospitalization: No Has patient had a PCN reaction occurring within the last 10 years: No If all of the above answers are "NO", then may proceed with Cephalosporin use.  Marland Kitchen Lisinopril Cough  . Tape Hives  . Doxycycline Hives, Swelling and Rash  . Latex Hives, Itching and Rash    Current Outpatient Medications on File Prior to Visit  Medication Sig Dispense Refill  . acetaminophen (TYLENOL) 325 MG tablet Take 2 tablets (650 mg total) by mouth every 6 (six) hours as needed for mild pain (or Fever >/= 101).    Marland Kitchen amLODipine (NORVASC) 5 MG tablet Take 1 tablet (5 mg total) by mouth daily. 90 tablet 3    . Blood Glucose Monitoring Suppl (ACCU-CHEK GUIDE) w/Device KIT 1 kit by Does not apply route as directed. 1 kit 0  . clopidogrel (PLAVIX) 75 MG tablet Take 75 mg by mouth.  1  . diphenoxylate-atropine (LOMOTIL) 2.5-0.025 MG tablet Take 1 tablet by mouth twice daily as needed for diarrhea. 50 tablet 2  . Dulaglutide (TRULICITY) 1.5 SE/8.3TD SOPN Inject 1.5 mg into the skin once a week. 4 pen 1  .  ezetimibe (ZETIA) 10 MG tablet Take 1 tablet (10 mg total) by mouth daily. 90 tablet 3  . fluticasone (FLONASE) 50 MCG/ACT nasal spray Place 1 spray into both nostrils 2 (two) times daily. 16 g 3  . furosemide (LASIX) 40 MG tablet Take 1 tablet (40 mg total) by mouth daily. 90 tablet 3  . glucose blood (ACCU-CHEK GUIDE) test strip Use to check blood sugars 3 times daily 100 each 12  . insulin aspart (NOVOLOG) 100 UNIT/ML injection Inject 0-9 Units into the skin 3 (three) times daily with meals. 10 mL 11  . Insulin Pen Needle (BD PEN NEEDLE NANO U/F) 32G X 4 MM MISC USE TO INJECT INSULIN AS DIRECTED 100 each 0  . latanoprost (XALATAN) 0.005 % ophthalmic solution     . lipase/protease/amylase (CREON) 36000 UNITS CPEP capsule Take 1 capsule (36,000 Units total) by mouth 3 (three) times daily with meals. 180 capsule 3  . loperamide (IMODIUM A-D) 2 MG tablet Take 1-2 tablets (2-4 mg total) by mouth daily as needed for diarrhea or loose stools. 30 tablet 1  . loperamide (IMODIUM A-D) 2 MG tablet Take 1 tablet by mouth twice daily for diarrhea. 50 tablet 2  . losartan (COZAAR) 100 MG tablet Take 100 mg by mouth daily.  0  . mirtazapine (REMERON) 7.5 MG tablet TAKE 1 TABLET(7.5 MG) BY MOUTH AT BEDTIME 90 tablet 1  . ondansetron (ZOFRAN ODT) 4 MG disintegrating tablet Take 1 tablet (4 mg total) by mouth every 8 (eight) hours as needed. (Patient taking differently: Take 4 mg by mouth every 8 (eight) hours as needed for nausea or vomiting. ) 10 tablet 0  . ONETOUCH VERIO test strip USE TO TEST BLOOD SUGAR THREE  TIMES DAILY 300 each 2  . promethazine (PHENERGAN) 25 MG tablet Take 0.5 tablets (12.5 mg total) by mouth every 6 (six) hours as needed for nausea or vomiting. 30 tablet 1  . rosuvastatin (CRESTOR) 20 MG tablet TAKE 1 TABLET(20 MG) BY MOUTH DAILY 90 tablet 3  . ruxolitinib phosphate (JAKAFI) 25 MG tablet Take 1 tablet (25 mg total) by mouth 2 (two) times daily. 60 tablet 6  . spironolactone (ALDACTONE) 50 MG tablet Take 2 tablets (100 mg total) by mouth daily. 60 tablet 6  . triamcinolone ointment (KENALOG) 0.5 % Apply 1 application topically 2 (two) times daily. 30 g 0   No current facility-administered medications on file prior to visit.     BP (!) 170/74   Pulse 93   Temp 98.2 F (36.8 C)   Wt 227 lb (103 kg)   SpO2 98%   BMI 38.36 kg/m       Objective:   Physical Exam Vitals signs and nursing note reviewed.  Constitutional:      General: She is not in acute distress.    Appearance: Normal appearance. She is not ill-appearing, toxic-appearing or diaphoretic.  HENT:     Right Ear: Tympanic membrane, ear canal and external ear normal.     Left Ear: Tympanic membrane, ear canal and external ear normal.     Nose: Nose normal. No congestion or rhinorrhea.  Eyes:     Extraocular Movements: Extraocular movements intact.     Conjunctiva/sclera: Conjunctivae normal.     Pupils: Pupils are equal, round, and reactive to light.  Cardiovascular:     Rate and Rhythm: Normal rate and regular rhythm.     Pulses: Normal pulses.     Heart sounds: Normal heart sounds.  Pulmonary:  Effort: Pulmonary effort is normal. No respiratory distress.     Breath sounds: No stridor. Wheezing (trace expiratory throughout ) present. No rhonchi or rales.  Chest:     Chest wall: No tenderness.  Skin:    Capillary Refill: Capillary refill takes less than 2 seconds.  Neurological:     General: No focal deficit present.     Mental Status: She is alert and oriented to person, place, and time.        Assessment & Plan:  1. Cough - possible bronchitis. Will get chest xray and treat as appropriate  - DG Chest 2 View; Future   [name], NP

## 2018-10-02 NOTE — Telephone Encounter (Signed)
Copied from [place] [id number]. Topic: Quick Communication - See Telephone Encounter >> Oct 02, 2018  5:00 PM Blase Mess A wrote: CRM for notification. See Telephone encounter for: 10/02/18.  Patient saw Tommi Rumps today. And called back to inquire can she go out of the house please advise [phone]

## 2018-10-03 NOTE — Telephone Encounter (Signed)
Mr Dufford notified that Audine may leave the house.

## 2018-10-03 NOTE — Telephone Encounter (Signed)
She can leave the house

## 2018-10-09 MED FILL — JAKAFI 25 MG TABLET: 25 | 30 days supply | Qty: 60 | Fill #3

## 2018-10-25 ENCOUNTER — Inpatient Hospital Stay: Payer: PPO

## 2018-10-25 ENCOUNTER — Inpatient Hospital Stay: Payer: PPO | Attending: Family | Admitting: Family

## 2018-10-25 ENCOUNTER — Other Ambulatory Visit: Payer: Self-pay

## 2018-10-25 ENCOUNTER — Encounter: Payer: Self-pay | Admitting: Family

## 2018-10-25 VITALS — BP 146/46 | HR 89 | Temp 98.2°F | Resp 18 | Wt 227.5 lb

## 2018-10-25 DIAGNOSIS — Z79899 Other long term (current) drug therapy: Secondary | ICD-10-CM | POA: Diagnosis not present

## 2018-10-25 DIAGNOSIS — D45 Polycythemia vera: Secondary | ICD-10-CM | POA: Diagnosis not present

## 2018-10-25 DIAGNOSIS — D500 Iron deficiency anemia secondary to blood loss (chronic): Secondary | ICD-10-CM

## 2018-10-25 LAB — CMP (CANCER CENTER ONLY)
ALBUMIN: 4.6 g/dL (ref 3.5–5.0)
ALK PHOS: 103 U/L (ref 38–126)
ALT: 25 U/L (ref 0–44)
AST: 47 U/L — ABNORMAL HIGH (ref 15–41)
Anion gap: 9 (ref 5–15)
BUN: 31 mg/dL — ABNORMAL HIGH (ref 8–23)
CO2: 25 mmol/L (ref 22–32)
Calcium: 9.1 mg/dL (ref 8.9–10.3)
Chloride: 102 mmol/L (ref 98–111)
Creatinine: 1.56 mg/dL — ABNORMAL HIGH (ref 0.44–1.00)
GFR, Est AFR Am: 36 mL/min — ABNORMAL LOW (ref >60)
GFR, Estimated: 31 mL/min — ABNORMAL LOW (ref >60)
GLUCOSE: 235 mg/dL — AB (ref 70–99)
Potassium: 4.6 mmol/L (ref 3.5–5.1)
Sodium: 136 mmol/L (ref 135–145)
Total Bilirubin: 0.9 mg/dL (ref 0.3–1.2)
Total Protein: 7.1 g/dL (ref 6.5–8.1)

## 2018-10-25 LAB — CBC WITH DIFFERENTIAL (CANCER CENTER ONLY)
Abs Immature Granulocytes: 2.28 10*3/uL — ABNORMAL HIGH (ref 0.00–0.07)
Basophils Absolute: 0.3 10*3/uL — ABNORMAL HIGH (ref 0.0–0.1)
Basophils Relative: 1 %
Eosinophils Absolute: 0 10*3/uL (ref 0.0–0.5)
Eosinophils Relative: 0 %
HCT: 38.1 % (ref 36.0–46.0)
Hemoglobin: 12.2 g/dL (ref 12.0–15.0)
Immature Granulocytes: 12 %
Lymphocytes Relative: 16 %
Lymphs Abs: 3.1 10*3/uL (ref 0.7–4.0)
MCH: 29 pg (ref 26.0–34.0)
MCHC: 32 g/dL (ref 30.0–36.0)
MCV: 90.7 fL (ref 80.0–100.0)
Monocytes Absolute: 1.4 10*3/uL — ABNORMAL HIGH (ref 0.1–1.0)
Monocytes Relative: 7 %
Neutro Abs: 12.3 10*3/uL — ABNORMAL HIGH (ref 1.7–7.7)
Neutrophils Relative %: 64 %
Platelet Count: 257 10*3/uL (ref 150–400)
RBC: 4.2 MIL/uL (ref 3.87–5.11)
RDW: 20.8 % — ABNORMAL HIGH (ref 11.5–15.5)
WBC Count: 19.3 10*3/uL — ABNORMAL HIGH (ref 4.0–10.5)
nRBC: 0.4 % — ABNORMAL HIGH (ref 0.0–0.2)

## 2018-10-25 LAB — LACTATE DEHYDROGENASE: LDH: 469 U/L — ABNORMAL HIGH (ref 98–192)

## 2018-10-25 LAB — FERRITIN: Ferritin: 82 ng/mL (ref 11–307)

## 2018-10-25 LAB — IRON AND TIBC
Iron: 139 ug/dL (ref 41–142)
Saturation Ratios: 36 % (ref 21–57)
TIBC: 381 ug/dL (ref 236–444)
UIBC: 243 ug/dL (ref 120–384)

## 2018-10-25 LAB — SAVE SMEAR (SSMR)

## 2018-10-25 NOTE — Progress Notes (Signed)
Hematology and Oncology Follow Up Visit  Amanda [name] [id number] [birth year] 78 y.o. 10/25/2018   Principle Diagnosis:  [name] - JAK2 (+)  Current Therapy:   Jakafi 25 mg po BID   Interim History:  Amanda [name] is here today with her husband for follow-up. She is doing quite well and states that her last Hgb A1c was under 6.  She is on Trulicity and states that she sees her endocrinologist again next week.  She verbalized that she is taking her Jakafi 25 mg PO BID as prescribed.  WBC count is 19.3, Hgb 12.2 and platelet count 257.  No episodes of bleeding, no bruising or petechiae.  She states that she recently had the flu.  No fever, chills, n/v, cough, rash, dizziness, SOB, chest pain, palpitations, abdominal pain or changes in bowel or bladder habits at this time.  The swelling in her feet and ankles is significantly improved.  She has occasional positional numbness and tingling in her hands is unchanged.  No lymphadenopathy noted on exam.  She has maintained a good appetite and is staying well hydrated. Her weight is stable.   ECOG Performance Status: 1 - Symptomatic but completely ambulatory  Medications:  Allergies as of 10/25/2018      Reactions   Penicillins Swelling   Has patient had a PCN reaction causing immediate rash, facial/tongue/throat swelling, SOB or lightheadedness with hypotension: No Has patient had a PCN reaction causing severe rash involving mucus membranes or skin necrosis: No Has patient had a PCN reaction that required hospitalization: No Has patient had a PCN reaction occurring within the last 10 years: No If all of the above answers are "NO", then may proceed with Cephalosporin use.   Lisinopril Cough   Tape Hives   Doxycycline Hives, Swelling, Rash   Latex Hives, Itching, Rash      Medication List       Accurate as of October 25, 2018  9:52 AM. Always use your most recent med list.        ACCU-CHEK GUIDE w/Device Kit 1 kit by Does  not apply route as directed.   acetaminophen 325 MG tablet Commonly known as:  TYLENOL Take 2 tablets (650 mg total) by mouth every 6 (six) hours as needed for mild pain (or Fever >/= 101).   amLODipine 5 MG tablet Commonly known as:  NORVASC Take 1 tablet (5 mg total) by mouth daily.   azithromycin 250 MG tablet Commonly known as:  ZITHROMAX Z-PAK Take 2 tablets on Day 1.  Then take 1 tablet daily.   clopidogrel 75 MG tablet Commonly known as:  PLAVIX Take 75 mg by mouth.   diphenoxylate-atropine 2.5-0.025 MG tablet Commonly known as:  LOMOTIL Take 1 tablet by mouth twice daily as needed for diarrhea.   Dulaglutide 1.5 MG/0.5ML Sopn Commonly known as:  TRULICITY Inject 1.5 mg into the skin once a week.   ezetimibe 10 MG tablet Commonly known as:  ZETIA Take 1 tablet (10 mg total) by mouth daily.   fluticasone 50 MCG/ACT nasal spray Commonly known as:  FLONASE Place 1 spray into both nostrils 2 (two) times daily.   furosemide 40 MG tablet Commonly known as:  LASIX Take 1 tablet (40 mg total) by mouth daily.   glucose blood test strip Commonly known as:  ACCU-CHEK GUIDE Use to check blood sugars 3 times daily   ONETOUCH VERIO test strip Generic drug:  glucose blood USE TO TEST BLOOD SUGAR THREE TIMES DAILY  insulin aspart 100 UNIT/ML injection Commonly known as:  novoLOG Inject 0-9 Units into the skin 3 (three) times daily with meals.   Insulin Pen Needle 32G X 4 MM Misc Commonly known as:  BD PEN NEEDLE NANO U/F USE TO INJECT INSULIN AS DIRECTED   latanoprost 0.005 % ophthalmic solution Commonly known as:  XALATAN   lipase/protease/amylase 36000 UNITS Cpep capsule Commonly known as:  CREON Take 1 capsule (36,000 Units total) by mouth 3 (three) times daily with meals.   loperamide 2 MG tablet Commonly known as:  IMODIUM A-D Take 1-2 tablets (2-4 mg total) by mouth daily as needed for diarrhea or loose stools.   loperamide 2 MG tablet Commonly known  as:  IMODIUM A-D Take 1 tablet by mouth twice daily for diarrhea.   losartan 100 MG tablet Commonly known as:  COZAAR Take 100 mg by mouth daily.   mirtazapine 7.5 MG tablet Commonly known as:  REMERON TAKE 1 TABLET(7.5 MG) BY MOUTH AT BEDTIME   ondansetron 4 MG disintegrating tablet Commonly known as:  ZOFRAN ODT Take 1 tablet (4 mg total) by mouth every 8 (eight) hours as needed.   promethazine 25 MG tablet Commonly known as:  PHENERGAN Take 0.5 tablets (12.5 mg total) by mouth every 6 (six) hours as needed for nausea or vomiting.   rosuvastatin 20 MG tablet Commonly known as:  CRESTOR TAKE 1 TABLET(20 MG) BY MOUTH DAILY   ruxolitinib phosphate 25 MG tablet Commonly known as:  JAKAFI Take 1 tablet (25 mg total) by mouth 2 (two) times daily.   spironolactone 50 MG tablet Commonly known as:  ALDACTONE Take 2 tablets (100 mg total) by mouth daily.   triamcinolone ointment 0.5 % Commonly known as:  KENALOG Apply 1 application topically 2 (two) times daily.       Allergies:  Allergies  Allergen Reactions  . Penicillins Swelling    Has patient had a PCN reaction causing immediate rash, facial/tongue/throat swelling, SOB or lightheadedness with hypotension: No Has patient had a PCN reaction causing severe rash involving mucus membranes or skin necrosis: No Has patient had a PCN reaction that required hospitalization: No Has patient had a PCN reaction occurring within the last 10 years: No If all of the above answers are "NO", then may proceed with Cephalosporin use.  Marland Kitchen Lisinopril Cough  . Tape Hives  . Doxycycline Hives, Swelling and Rash  . Latex Hives, Itching and Rash    Past Medical History, Surgical history, Social history, and Family History were reviewed and updated.  Review of Systems: All other 10 point review of systems is negative.   Physical Exam:  weight is 227 lb 8 oz (103.2 kg). Her oral temperature is 98.2 F (36.8 C). Her blood pressure is  146/46 (abnormal) and her pulse is 89. Her respiration is 18 and oxygen saturation is 97%.   Wt Readings from Last 3 Encounters:  10/25/18 227 lb 8 oz (103.2 kg)  10/02/18 227 lb (103 kg)  09/29/18 225 lb 4 oz (102.2 kg)    Ocular: Sclerae unicteric, pupils equal, round and reactive to light Ear-nose-throat: Oropharynx clear, dentition fair Lymphatic: No cervical, supraclavicular or axillary adenopathy Lungs no rales or rhonchi, good excursion bilaterally Heart regular rate and rhythm, no murmur appreciated Abd soft, nontender, positive bowel sounds, no liver or spleen tip palpated on exam, no fluid wave  MSK no focal spinal tenderness, no joint edema Neuro: non-focal, well-oriented, appropriate affect Breasts: Deferred   Lab Results  Component Value Date   WBC 19.3 (H) 10/25/2018   HGB 12.2 10/25/2018   HCT 38.1 10/25/2018   MCV 90.7 10/25/2018   PLT 257 10/25/2018   Lab Results  Component Value Date   FERRITIN 65 08/25/2018   IRON 138 08/25/2018   TIBC 383 08/25/2018   UIBC 244 08/25/2018   IRONPCTSAT 36 08/25/2018   Lab Results  Component Value Date   RETICCTPCT 1.6 08/25/2018   RBC 4.20 10/25/2018   No results found for: Nils Pyle [facility] Lab Results  Component Value Date   IGA 233 03/29/2014   No results found for: Kathrynn Ducking, MSPIKE, SPEI   Chemistry      Component Value Date/Time   NA 141 09/29/2018 0920   NA 140 10/12/2017 1013   NA 138 01/21/2017 0806   K 3.7 09/29/2018 0920   K 3.7 10/12/2017 1013   K 4.1 01/21/2017 0806   CL 107 09/29/2018 0920   CL 103 10/12/2017 1013   CL 103 02/16/2013 0900   CO2 23 09/29/2018 0920   CO2 25 10/12/2017 1013   CO2 21 (L) 01/21/2017 0806   BUN 24 (H) 09/29/2018 0920   BUN 10 10/12/2017 1013   BUN 12.2 01/21/2017 0806   CREATININE 1.19 09/29/2018 0920   CREATININE 1.60 (H) 08/25/2018 0839   CREATININE 0.9 10/12/2017 1013   CREATININE 1.0  01/21/2017 0806      Component Value Date/Time   CALCIUM 8.9 09/29/2018 0920   CALCIUM 8.7 10/12/2017 1013   CALCIUM 9.3 01/21/2017 0806   ALKPHOS 94 09/29/2018 0920   ALKPHOS 97 (H) 10/12/2017 1013   ALKPHOS 112 01/21/2017 0806   AST 33 09/29/2018 0920   AST 50 (H) 08/25/2018 0839   AST 37 (H) 01/21/2017 0806   ALT 14 09/29/2018 0920   ALT 24 08/25/2018 0839   ALT 25 10/12/2017 1013   ALT 16 01/21/2017 0806   BILITOT 0.8 09/29/2018 0920   BILITOT 1.1 08/25/2018 0839   BILITOT 0.89 01/21/2017 0806       Impression and Plan: Amanda [name] is a very pleasant 78 yo caucasian female with polycythemia vera, JAK-2 positive. She continues to do well and is tolerating Jakafi nicely. Her counts have remained stable.  We will plan to see her back in another 3 months.  They will contact our office with any questions or concerns. We can certainly see her sooner if need be.   Laverna Peace, NP 1/8/20209:52 AM

## 2018-10-26 ENCOUNTER — Telehealth: Payer: Self-pay | Admitting: Internal Medicine

## 2018-10-26 ENCOUNTER — Other Ambulatory Visit: Payer: Self-pay

## 2018-10-26 MED ORDER — INSULIN PEN NEEDLE 32G X 4 MM MISC: 0 refills | Status: DC

## 2018-10-26 NOTE — Telephone Encounter (Signed)
Rx sent to appropriate pharmacy

## 2018-10-26 NOTE — Telephone Encounter (Signed)
Pharmacy is needing directions for patients pen needles. Please Advise, thanks

## 2018-10-26 NOTE — Telephone Encounter (Signed)
MEDICATION: Insulin Pen Needle (BD PEN NEEDLE NANO U/F) 32G X 4 MM MISC  PHARMACY:  [place], [place] A 90 DAY SUPPLY :   IS PATIENT OUT OF MEDICATION: Yes  IF NOT; HOW MUCH IS LEFT:   LAST APPOINTMENT DATE: @10 /27/2019  NEXT APPOINTMENT DATE:@2 /01/2019  DO WE HAVE YOUR PERMISSION TO LEAVE A DETAILED MESSAGE:  OTHER COMMENTS:    **Let patient know to contact pharmacy at the end of the day to make sure medication is ready. **  ** Please notify patient to allow 48-72 hours to process**  **Encourage patient to contact the pharmacy for refills or they can request refills through [facility]**

## 2018-10-26 NOTE — Telephone Encounter (Signed)
New RX sent with info.

## 2018-10-27 ENCOUNTER — Other Ambulatory Visit: Payer: Self-pay | Admitting: Internal Medicine

## 2018-11-03 ENCOUNTER — Other Ambulatory Visit: Payer: Self-pay | Admitting: Adult Health

## 2018-11-03 NOTE — Telephone Encounter (Signed)
Sent to the pharmacy as instructed.

## 2018-11-03 NOTE — Telephone Encounter (Signed)
Ok to send in for a year

## 2018-11-04 ENCOUNTER — Emergency Department (HOSPITAL_COMMUNITY): Payer: PPO

## 2018-11-04 ENCOUNTER — Emergency Department (HOSPITAL_COMMUNITY)
Admission: EM | Admit: 2018-11-04 | Discharge: 2018-11-04 | Disposition: A | Discharge status: Home / Self Care | Payer: PPO | Attending: Emergency Medicine | Admitting: Emergency Medicine

## 2018-11-04 DIAGNOSIS — I5031 Acute diastolic (congestive) heart failure: Secondary | ICD-10-CM | POA: Diagnosis not present

## 2018-11-04 DIAGNOSIS — Y939 Activity, unspecified: Secondary | ICD-10-CM | POA: Diagnosis not present

## 2018-11-04 DIAGNOSIS — S82831A Other fracture of upper and lower end of right fibula, initial encounter for closed fracture: Secondary | ICD-10-CM | POA: Insufficient documentation

## 2018-11-04 DIAGNOSIS — I10 Essential (primary) hypertension: Secondary | ICD-10-CM | POA: Diagnosis not present

## 2018-11-04 DIAGNOSIS — M25571 Pain in right ankle and joints of right foot: Secondary | ICD-10-CM | POA: Diagnosis not present

## 2018-11-04 DIAGNOSIS — M79671 Pain in right foot: Secondary | ICD-10-CM | POA: Diagnosis not present

## 2018-11-04 DIAGNOSIS — I110 Hypertensive heart disease with heart failure: Secondary | ICD-10-CM | POA: Diagnosis not present

## 2018-11-04 DIAGNOSIS — Y92002 Bathroom of unspecified non-institutional (private) residence single-family (private) house as the place of occurrence of the external cause: Secondary | ICD-10-CM | POA: Diagnosis not present

## 2018-11-04 DIAGNOSIS — I2510 Atherosclerotic heart disease of native coronary artery without angina pectoris: Secondary | ICD-10-CM | POA: Insufficient documentation

## 2018-11-04 DIAGNOSIS — R609 Edema, unspecified: Secondary | ICD-10-CM | POA: Diagnosis not present

## 2018-11-04 DIAGNOSIS — E1140 Type 2 diabetes mellitus with diabetic neuropathy, unspecified: Secondary | ICD-10-CM | POA: Insufficient documentation

## 2018-11-04 DIAGNOSIS — Y999 Unspecified external cause status: Secondary | ICD-10-CM | POA: Insufficient documentation

## 2018-11-04 DIAGNOSIS — W0110XA Fall on same level from slipping, tripping and stumbling with subsequent striking against unspecified object, initial encounter: Secondary | ICD-10-CM | POA: Insufficient documentation

## 2018-11-04 DIAGNOSIS — W19XXXA Unspecified fall, initial encounter: Secondary | ICD-10-CM | POA: Diagnosis not present

## 2018-11-04 DIAGNOSIS — Z955 Presence of coronary angioplasty implant and graft: Secondary | ICD-10-CM | POA: Insufficient documentation

## 2018-11-04 DIAGNOSIS — Z794 Long term (current) use of insulin: Secondary | ICD-10-CM | POA: Diagnosis not present

## 2018-11-04 DIAGNOSIS — E11319 Type 2 diabetes mellitus with unspecified diabetic retinopathy without macular edema: Secondary | ICD-10-CM | POA: Insufficient documentation

## 2018-11-04 DIAGNOSIS — Z79899 Other long term (current) drug therapy: Secondary | ICD-10-CM | POA: Insufficient documentation

## 2018-11-04 DIAGNOSIS — S8991XA Unspecified injury of right lower leg, initial encounter: Secondary | ICD-10-CM | POA: Diagnosis present

## 2018-11-04 DIAGNOSIS — R52 Pain, unspecified: Secondary | ICD-10-CM | POA: Diagnosis not present

## 2018-11-04 DIAGNOSIS — Z87891 Personal history of nicotine dependence: Secondary | ICD-10-CM | POA: Diagnosis not present

## 2018-11-04 DIAGNOSIS — S99921A Unspecified injury of right foot, initial encounter: Secondary | ICD-10-CM | POA: Diagnosis not present

## 2018-11-04 MED ORDER — FENTANYL CITRATE (PF) 100 MCG/2ML IJ SOLN: 50.0000 ug | Freq: Once | INTRAMUSCULAR | Status: DC

## 2018-11-04 MED ORDER — HYDROCODONE-ACETAMINOPHEN 5-325 MG PO TABS
2.0000 | ORAL_TABLET | Freq: Once | ORAL | Status: AC
  Administered 2018-11-04: 2 via ORAL
  Filled 2018-11-04: qty 2

## 2018-11-04 MED ORDER — ONDANSETRON HCL 4 MG/2ML IJ SOLN: 4.0000 mg | Freq: Once | INTRAMUSCULAR | Status: DC

## 2018-11-04 MED ORDER — OXYCODONE-ACETAMINOPHEN 5-325 MG PO TABS: 1.0000 | ORAL_TABLET | Freq: Three times a day (TID) | ORAL | 0 refills | Status: DC | PRN

## 2018-11-04 NOTE — ED Triage Notes (Signed)
Pt presents to [facility] via [facility] r/t a fall x2 hours PTA. Pt states this morning she attempted to walk to the bathroom when her legs gave and caused her to fall. Pt states that she landed on her right leg and now has 9/10 pain in her right ankle. PMS intact, pain is increased while weight bearing. Pt denies hitting her head or LOC. +blood thinners r/t previous heart attack. Pt is alert and oriented. Skin is warm and dry. Respirations are regular even and unlabored.

## 2018-11-04 NOTE — ED Provider Notes (Signed)
[facility] DEPT Provider Note   CSN: [id number] Arrival date & time: 11/04/18  1106     History   Chief Complaint Chief Complaint  Patient presents with  . Fall    HPI Amanda [name] is a 78 y.o. female who presents for evaluation of right ankle pain after mechanical fall.  Patient reports that she got up at about 9:30 AM and was in the bathroom.  She reports that she got tangled with her feet and tripped over her own foot, causing her to fall.  She reports at that time, her leg bent under her.  Patient states most of her pain is on the lateral aspect of the right ankle.  She describes pain as 7/10.  Patient reports that she did not hit her head.  She states that she had no preceding chest pain or dizziness prior to onset of fall.  Patient reports that since then, she has not been able to ambulate or bear weight on her right lower extremity secondary to pain.  Patient reports she has not taken medication for the pain.  She reports that she is on blood thinners.  She states she did not hit her head or lose consciousness.  She has not had any vision changes, numbness/weakness, nausea/vomiting.  Patient denies any chest pain, difficulty breathing, abdominal pain.    The history is provided by the patient.    Past Medical History:  Diagnosis Date  . Allergic rhinitis 01/23/2016  . C. difficile colitis   . CAD (coronary artery disease)    a. s/p STEMI in 01/2015 with 95% LCx stenosis and distal 80% LCx stenosis (DESx2 placed)  . Cancer ([place])    SKIN  . Candidiasis of skin 09/30/2014  . Cirrhosis ([place])   . Depression   . Diabetes mellitus 2008  . Gout   . Herpes simplex   . Hyperglycemia 05/31/2013  . Hyperlipidemia   . Hypertension   . Myocardial infarction ([place])   . Neuromuscular disorder ([place])    BELL PALSY  . Obesity   . Polycythemia    Dr. Elease Hashimoto- [facility]  . Psoriasis     Patient Active Problem List   Diagnosis Date Noted  . Acute  blood loss anemia 05/23/2018  . Dehydration 03/22/2018  . AKI (acute kidney injury) ([place]) 03/22/2018  . CVA (cerebral vascular accident) ([place]) 03/22/2018  . Hip injury, left, subsequent encounter 11/08/2017  . Iron deficiency anemia 12/22/2016  . OSA (obstructive sleep apnea) 04/21/2016  . Osteopenia 04/07/2016  . Lung nodule, solitary 02/05/2016  . Aortic dilatation ([place]) 02/05/2016  . Dyspnea 01/28/2016  . Allergic rhinitis 01/23/2016  . Hepatic cirrhosis ([place]) 10/28/2015  . Irritable bowel syndrome 08/12/2015  . Obesity (BMI 30-39.9) 04/30/2015  . Diabetic retinopathy of both eyes ([place]) 04/18/2015  . Former smoker 04/18/2015  . GAD (generalized anxiety disorder) 04/18/2015  . Status post primary angioplasty with coronary stent 04/18/2015  . Coronary artery disease involving native coronary artery 03/01/2015  . Acute diastolic heart failure ([place]) 02/01/2015  . ST elevation myocardial infarction (STEMI) involving left circumflex coronary artery in recovery phase ([place]) 01/27/2015  . Idioventricular rhythm ([place])   . Diabetic peripheral neuropathy associated with type 2 diabetes mellitus ([place]) 01/23/2015  . Type 2 diabetes mellitus with diabetic polyneuropathy ([place]) 01/23/2015  . Thrombocytosis ([place]) 07/04/2014  . Cholelithiasis 02/07/2014  . Chronic diarrhea 01/29/2014  . [name] ([place]) 05/31/2013  . Essential hypertension 05/31/2013  . History of Bell's palsy  05/26/2013  . DERMATITIS, ATOPIC 11/16/2010  . DIZZINESS 11/16/2010  . DIASTOLIC DYSFUNCTION 27/12/5007  . Hyperlipidemia 12/15/2009  . Essential hypertension, benign 11/24/2009  . PSORIASIS 11/24/2009  . Depression 09/13/2009  . 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 BIOPSY  05/26/2018   Procedure: BIOPSY;  Surgeon: [name], MD;  Location: [facility] ENDOSCOPY;  Service: Endoscopy;;  . BREAST SURGERY Left    milk duct  .  CATARACT EXTRACTION, BILATERAL    . COLONOSCOPY    . ESOPHAGOGASTRODUODENOSCOPY N/A 05/26/2018   Procedure: ESOPHAGOGASTRODUODENOSCOPY (EGD);  Surgeon: [name], MD;  Location[facility]OSCOPY;  Service: Endoscopy;  Laterality: N/A;  . IR PARACENTESIS  05/25/2018  . LEFT HEART CATHETERIZATION WITH CORONARY ANGIOGRAM N/A 01/27/2015   Procedure: LEFT HEART CATHETERIZATION WITH CORONARY ANGIOGRAM;  Surgeon: [name], MD;  Location: [facility] CATH LAB;  Service: Cardiovascular;  Laterality: N/A;  . TONSILLECTOMY    . TOTAL ABDOMINAL HYSTERECTOMY W/ BILATERAL SALPINGOOPHORECTOMY     for heavy periods with appendectomy     OB History   No obstetric history on file.      Home Medications    Prior to Admission medications   Medication Sig Start Date End Date Taking? Authorizing Provider  acetaminophen (TYLENOL) 325 MG tablet Take 2 tablets (650 mg total) by mouth every 6 (six) hours as needed for mild pain (or Fever >/= 101). 05/27/18   Georgette Shell, MD  amLODipine (NORVASC) 5 MG tablet Take 1 tablet (5 mg total) by mouth daily. 09/19/18   [name], MD  azithromycin (ZITHROMAX Z-PAK) 250 MG tablet Take 2 tablets on Day 1.  Then take 1 tablet daily. 10/02/18   Nafziger, Tommi Rumps, NP  Blood Glucose Monitoring Suppl (ACCU-CHEK GUIDE) w/Device KIT 1 kit by Does not apply route as directed. 01/25/18   [name], MD  clopidogrel (PLAVIX) 75 MG tablet TAKE 1 TABLET(75 MG) BY MOUTH DAILY 11/03/18   Nafziger, Tommi Rumps, NP  diphenoxylate-atropine (LOMOTIL) 2.5-0.025 MG tablet Take 1 tablet by mouth twice daily as needed for diarrhea. 08/16/18   [place], Amy S, PA-C  Dulaglutide (TRULICITY) 1.5 FG/1.8EX SOPN Inject 1.5 mg into the skin once a week. 07/10/18   [name], MD  ezetimibe (ZETIA) 10 MG tablet Take 1 tablet (10 mg total) by mouth daily. 09/19/18 12/18/18  [name], MD  fluticasone (FLONASE) 50 MCG/ACT nasal spray Place 1 spray into both nostrils 2 (two) times daily. 09/27/18    [place], Betty G, MD  furosemide (LASIX) 40 MG tablet Take 1 tablet (40 mg total) by mouth daily. 09/19/18   [name], MD  glucose blood (ACCU-CHEK GUIDE) test strip Use to check blood sugars 3 times daily 01/25/18   [name], MD  insulin aspart (NOVOLOG) 100 UNIT/ML injection Inject 0-9 Units into the skin 3 (three) times daily with meals. 05/27/18   Georgette Shell, MD  Insulin Pen Needle (BD PEN NEEDLE NANO U/F) 32G X 4 MM MISC USE AS DIRECTED TO INJECT INSULIN 10/27/18   [name], MD  latanoprost (XALATAN) 0.005 % ophthalmic solution  06/21/18   [provider]  lipase/protease/amylase (CREON) 36000 UNITS CPEP capsule Take 1 capsule (36,000 Units total) by mouth 3 (three) times daily with meals. 05/28/18   Emokpae, Ejiroghene E, MD  loperamide (IMODIUM A-D) 2 MG tablet Take 1-2 tablets (2-4 mg total) by mouth daily as needed for diarrhea or loose stools.  05/28/16   Hali Marry, MD  loperamide (IMODIUM A-D) 2 MG tablet Take 1 tablet by mouth twice daily for diarrhea. 08/16/18   [place], Amy S, PA-C  losartan (COZAAR) 100 MG tablet Take 100 mg by mouth daily. 08/05/18   [provider]  mirtazapine (REMERON) 7.5 MG tablet TAKE 1 TABLET(7.5 MG) BY MOUTH AT BEDTIME 08/08/18   Nafziger, Tommi Rumps, NP  ondansetron (ZOFRAN ODT) 4 MG disintegrating tablet Take 1 tablet (4 mg total) by mouth every 8 (eight) hours as needed. Patient taking differently: Take 4 mg by mouth every 8 (eight) hours as needed for nausea or vomiting.  01/04/18   Sherwood Gambler, MD  [facility] VERIO test strip USE TO TEST BLOOD SUGAR THREE TIMES DAILY 05/01/18   [name], MD  oxyCODONE-acetaminophen (PERCOCET/ROXICET) 5-325 MG tablet Take 1-2 tablets by mouth every 8 (eight) hours as needed for severe pain. 11/04/18   Volanda Napoleon, PA-C  promethazine (PHENERGAN) 25 MG tablet Take 0.5 tablets (12.5 mg total) by mouth every 6 (six) hours as needed for nausea or vomiting.  05/28/18   Emokpae, Ejiroghene E, MD  rosuvastatin (CRESTOR) 20 MG tablet TAKE 1 TABLET(20 MG) BY MOUTH DAILY 10/17/17   [name], MD  ruxolitinib phosphate (JAKAFI) 25 MG tablet Take 1 tablet (25 mg total) by mouth 2 (two) times daily. 07/14/18   Volanda Napoleon, MD  spironolactone (ALDACTONE) 50 MG tablet Take 2 tablets (100 mg total) by mouth daily. 07/07/18   Milus Banister, MD  triamcinolone ointment (KENALOG) 0.5 % Apply 1 application topically 2 (two) times daily. 05/28/18   Bethena Roys, MD    Family History Family History  Problem Relation Age of Onset  . Diabetes Mother   . Heart disease Mother        CAD  . Hyperlipidemia Mother   . Hypertension Mother   . Kidney disease Mother   . Kidney disease Father   . Breast cancer Maternal Aunt   . Heart disease Maternal Grandmother   . Heart disease Other        maternal aunts and uncles  . Colon cancer Neg Hx   . Esophageal cancer Neg Hx     Social History Social History   Tobacco Use  . Smoking status: Former Smoker    Years: 48.00    Types: Cigarettes    Last attempt to quit: 10/18/1986    Years since quitting: 32.0  . Smokeless tobacco: Never Used  Substance Use Topics  . Alcohol use: No    Alcohol/week: 0.0 standard drinks  . Drug use: No     Allergies   Penicillins; Lisinopril; Tape; Doxycycline; and Latex   Review of Systems Review of Systems  Constitutional: Negative for fever.  Respiratory: Negative for cough and shortness of breath.   Cardiovascular: Negative for chest pain.  Gastrointestinal: Negative for abdominal pain, nausea and vomiting.  Musculoskeletal:       Leg pain  Neurological: Negative for dizziness, weakness, numbness and headaches.  All other systems reviewed and are negative.    Physical Exam Updated Vital Signs BP (!) 150/116   Pulse 83   Temp 98 F (36.7 C) (Oral)   Resp 16   Ht 5' 4.5" (1.638 m)   Wt 98 kg   SpO2 93%   BMI 36.50 kg/m   Physical  Exam Vitals signs and nursing note reviewed.  Constitutional:      Appearance: Normal appearance. She is well-developed.  HENT:  Head: Normocephalic and atraumatic.     Comments: No tenderness to palpation of skull. No deformities or crepitus noted. No open wounds, abrasions or lacerations.  Eyes:     General: Lids are normal.     Conjunctiva/sclera: Conjunctivae normal.     Pupils: Pupils are equal, round, and reactive to light.     Comments: EOMs intact without any difficulty.   Neck:     Musculoskeletal: Full passive range of motion without pain.     Comments: Full flexion/extension and lateral movement of neck fully intact. No bony midline tenderness. No deformities or crepitus.  Cardiovascular:     Rate and Rhythm: Normal rate and regular rhythm.     Pulses: Normal pulses.          Radial pulses are 2+ on the right side and 2+ on the left side.       Dorsalis pedis pulses are 2+ on the right side and 2+ on the left side.     Heart sounds: Normal heart sounds. No murmur. No friction rub. No gallop.   Pulmonary:     Effort: Pulmonary effort is normal.     Breath sounds: Normal breath sounds.     Comments: Lungs clear to auscultation bilaterally.  Symmetric chest rise.  No wheezing, rales, rhonchi. Abdominal:     Palpations: Abdomen is soft. Abdomen is not rigid.     Tenderness: There is no abdominal tenderness. There is no guarding.  Musculoskeletal: Normal range of motion.     Comments: Tenderness palpation noted to the lateral malleolus of the right ankle with overlying soft tissue swelling.  No deformity or crepitus noted.  Limited dorsiflexion and plantarflexion secondary to pain.  No tenderness palpation noted right knee, right hip.  Flexion/tension of right knee intact without any difficulty.  Flexion/tension and internal and external rotation of right hip intact without any difficulty.  No tenderness palpation noted to left lower extremity.   Skin:    General: Skin is warm  and dry.     Capillary Refill: Capillary refill takes less than 2 seconds.     Comments: Good distal cap refill.  RLE is not dusky in appearance or cool to touch.  Neurological:     Mental Status: She is alert and oriented to person, place, and time.     Comments: Cranial nerves III-XII intact Follows commands, Moves all extremities  5/5 strength to BUE and BLE  Sensation intact throughout all major nerve distributions Normal coordination No slurred speech. No facial droop.   Psychiatric:        Speech: Speech normal.      ED Treatments / Results  Labs (all labs ordered are listed, but only abnormal results are displayed) Labs Reviewed - No data to display  EKG None  Radiology Dg Ankle Complete Right  Result Date: 11/04/2018 CLINICAL DATA:  Pain following fall EXAM: RIGHT ANKLE - COMPLETE 3+ VIEW COMPARISON:  January 21, 2009 FINDINGS: Frontal, oblique, and lateral views were obtained. There is a spiral fracture of the distal fibular diaphysis with alignment near anatomic. No other acute fracture evident. No appreciable joint effusion. There is bony overgrowth along the medial malleolus which potentially may represent residua of old trauma in this area. There is no appreciable joint space narrowing. There are posterior and inferior calcaneal spurs. No erosive change. There are multiple foci of arterial vascular calcification. IMPRESSION: Spiral fracture distal fibula with alignment near anatomic. Question old trauma medial malleolus. Ankle mortise appears intact.  No appreciable joint space narrowing. There are calcaneal spurs. There is multifocal arterial vascular calcification. Electronically Signed   By: Lowella Grip III M.D.   On: 11/04/2018 12:46   Ct Ankle Right Wo Contrast  Result Date: 11/04/2018 CLINICAL DATA:  Right ankle pain status post fall 2 hours ago. EXAM: CT OF THE RIGHT ANKLE WITHOUT CONTRAST TECHNIQUE: Multidetector CT imaging of the right ankle was performed  according to the standard protocol. Multiplanar CT image reconstructions were also generated. COMPARISON:  None. FINDINGS: Bones/Joint/Cartilage Generalized osteopenia. Oblique nondisplaced fracture of the distal fibular metaphysis. No other acute fracture or dislocation. Ankle mortise is intact. No aggressive osseous lesion. No periosteal reaction or bone destruction. Enthesopathic changes of the Achilles tendon insertion. Small plantar calcaneal spur. Normal alignment. No joint effusion. Old avulsive injury adjacent to the medial malleolus. Ligaments Ligaments are suboptimally evaluated by CT. Muscles and Tendons Muscles are normal. Flexor, extensor, peroneal and Achilles tendons are intact. Soft tissue No fluid collection or hematoma. No soft tissue mass. Peripheral vascular atherosclerotic disease. IMPRESSION: 1.  Oblique nondisplaced fracture of the distal fibular metaphysis. Electronically Signed   By: Kathreen Devoid   On: 11/04/2018 13:58   Dg Foot Complete Right  Result Date: 11/04/2018 CLINICAL DATA:  Pain following fall EXAM: RIGHT FOOT COMPLETE - 3+ VIEW COMPARISON:  None. FINDINGS: Frontal, oblique, and lateral views were obtained. There is no appreciable fracture or dislocation in the foot region. There are posterior inferior calcaneal spurs. There is no appreciable joint space narrowing or erosion. There are flexion deformities of the second, third, fourth, and fifth PIP and MTP joints. There are multiple foci of arterial vascular calcification. IMPRESSION: No fracture or dislocation. There are calcaneal spurs. No appreciable joint space narrowing or erosion. Multiple foci of arterial vascular calcification noted. Electronically Signed   By: Lowella Grip III M.D.   On: 11/04/2018 12:52    Procedures Procedures (including critical care time)  Medications Ordered in ED Medications  HYDROcodone-acetaminophen (NORCO/VICODIN) 5-325 MG per tablet 2 tablet (2 tablets Oral Given 11/04/18 1214)      Initial Impression / Assessment and Plan / ED Course  I have reviewed the triage vital signs and the nursing notes.  Pertinent labs & imaging results that were available during my care of the patient were reviewed by me and considered in my medical decision making (see chart for details).     78 year old female who presents for evaluation of right leg pain after mechanical fall.  Patient reports that she tripped on her feet, causing her to fall and have her right leg landed underneath her.  She has not been able to ambulate or bear weight since the incident.  She states she is currently on blood thinners.  She states that she did not hit her head or lose consciousness.  No vision changes, numbness/weakness, nausea/vomiting since the incident. Patient is afebrile, non-toxic appearing, sitting comfortably on examination table. Vital signs reviewed and stable. Patient is neurovascularly intact.  Patient with pain and tenderness palpation noted to the lateral malleolus of the right ankle.  Concern for fracture versus dislocation.  Patient denies any head injury, LOC.  No neuro deficits noted on exam.  No indication for CT head for evaluation.  Do not suspect intracranial hemorrhage given history/physical exam.  Patient with no tenderness palpation of the midline C-spine.  Indication for CT cervical spine at this time.  X-ray reviewed.  There is mention of distal fibula fracture.  Question if there is  an old malleolus fracture versus new finding.  Will plan for CT of ankle for further evaluation.  CT of right ankle shows oblique nondisplaced fracture of the distal fibular metaphysis.  No other acute findings.  Discussed results with patient.  We will plan to put her in a cam walker boot given that she does not have any significant displacement that is the only finding.  Given her age, I am concerned that she would not be able to tolerate splinting, crutches.  Patient reports she does have a walker at  home.  I discussed with patient regarding cam walker boot, using walker/crutches and toe-touch weightbearing.  Reevaluation after cam walker boot placed.  Patient stable for discharge at this time.  Instructed patient to follow-up with referred orthopedic doctor for further evaluation. At this time, patient exhibits no emergent life-threatening condition that require further evaluation in ED or admission. Patient had ample opportunity for questions and discussion. All patient's questions were answered with full understanding. Strict return precautions discussed. Patient expresses understanding and agreement to plan.   Portions of this note were generated with [employer]. Dictation errors may occur despite best attempts at proofreading.  Final Clinical Impressions(s) / ED Diagnoses   Final diagnoses:  Closed fracture of distal end of right fibula, unspecified fracture morphology, initial encounter    ED Discharge Orders         Ordered    oxyCODONE-acetaminophen (PERCOCET/ROXICET) 5-325 MG tablet  Every 8 hours PRN     11/04/18 1510    Walker standard     11/04/18 1515    Walker standard     11/04/18 1515           Volanda Napoleon, PA-C 11/04/18 1602    Duffy Bruce, MD 11/06/18 1103

## 2018-11-04 NOTE — ED Notes (Signed)
Bed: WA07 Expected date: 11/04/18 Expected time: 11:08 AM Means of arrival: Ambulance Comments: Fall ankle injury

## 2018-11-04 NOTE — Discharge Instructions (Signed)
You can take 1000 mg of Tylenol.  Do not exceed 4000 mg of Tylenol a day.  Take pain medications as directed for break through pain. Do not drive or operate machinery while taking this medication.   As we discussed, wear the cam walking boot for support and stabilization.  You should use your walker to help you ambulate.  You should attempt to do toe-touch weightbearing which means putting most the weight on your toes.  Do not put weight on her heels.  Follow-up with referred orthopedic doctor for further evaluation.  Call their office on Monday to arrange for an appointment.  Return the emergency department for any worsening pain, redness or swelling of the leg, numbness/weakness or any other worsening or concerning symptoms.

## 2018-11-04 NOTE — ED Notes (Signed)
Ortho the called at this time to apply splint to pt right leg.

## 2018-11-06 DIAGNOSIS — M25571 Pain in right ankle and joints of right foot: Secondary | ICD-10-CM | POA: Diagnosis not present

## 2018-11-06 MED FILL — JAKAFI 25 MG TABLET: 25 | 30 days supply | Qty: 60 | Fill #4

## 2018-11-08 ENCOUNTER — Telehealth: Payer: Self-pay | Admitting: Adult Health

## 2018-11-08 ENCOUNTER — Other Ambulatory Visit: Payer: Self-pay | Admitting: *Deleted

## 2018-11-08 NOTE — Telephone Encounter (Signed)
Spoke with patient husband to see what issues/ concerns need to be addressed. Per patient husband patient can not get up and down by herself since injury. Husband reports he works 2-3 days a week and patient is hoe by herself and he was told he can't home health to come out to be with patient as they would have to pay out of pocket (they have no family to come and help). Husband also reports patient goes to xray on Monday to see what the steps of care will entail. Lastly, husband reports he lost patient rx for pain medication for percocet he believes and would like another rx.

## 2018-11-08 NOTE — Patient Outreach (Signed)
[name]) Care Management  11/08/2018  Amanda [name] 12-21-1940 [id number]   Telephone Screen  Referral Date:11/08/2018 Referral Source: [facility] referral  Referral Reason: "Spoke with Member on 11/07/2018 reached her at phone number [phone]. She gave me verbal permission to speaker with husband Amanda [name] his cell phone number is [phone]. Member ws seen in ER.on 11/04/2018. She has a closed fracture of distal end of right fibula she is unable to bear weight and transfer her self to bathroom.  Husband has to work M, T, W from 2:30-12:30 pm.  They are in need for assistants for a home health aide or care giver assistance. I have reached out to the orthopedic DR. Kramers office at [phone] they have sent a message to DR to see about getting a home health order. Pleas follow up for support and see if any resources are available to assist them during this time."  -[place]: HTA  Last ED visit on 11/04/2018  Outreach attempt # 1 successful at [phone] Patient is able to verify HIPAA She had difficulty "hearing you" and gave CM permission to speak with her husband  Reviewed and addressed referral to [facility] with Mr Galdamez  Mr Pieri reports he received a return call from HTA staff to inform him that HTA would not be able to assist with the cost of care at home for this patient. He states he was informed that he has option to pay out of pocket for personal care services and "no one could come out"  He reports he consulted with his son who encourages him to apply for medicaid services for Amanda [name] Mr Tarquinio informed [facility] RN CM that he ONLY needs for someone to be with Amanda [name] while he works Mondays, Tuesdays and Wednesday from 1430 to 0030 after CM reviewed [facility] services for RN, SW and pharmacy He confirms Amanda [name] is able to bathe with assistance but at this time is "not allowed to bather related to her splint." He cooks and leaves meals and medicines for her  either near her bed or chair. He reports [facility] is to bring a potty chair on 11/09/2018 and the order was obtained from the ortho MD of the office of Percell Miller and [place] Mr Orama confirms his mobile number as  26 Lamb consulted with Amanda [name] [facility] SW about the referral and voiced concerns. Amber recommended assessing if they were able to pay out of pocket and offering to have a [facility] SW referral for resources as HTA staff has already informed them that these services is not paid by HTA [facility] RN CM returned a call to Mr Nyce mobile to assess further He confirms he is not able to pay out of pocket "I can't afford it" and he informs CM he will be going to DSS to apply for medicaid for the patient  [facility] RN CM called Dr Percell Miller office to attempt to speak with [name] to see if a referral or order was requested or sent to a home health agency which is different from personal care services. Left [name] a message to return a call to [facility] RN CM for w/c  Pending a return call    Social: Amanda [name] lives at home with her husband. They have a son and daughter. When Cm inquired about possible family, friends or church members Mr Samad did not respond but informed CM he would be going  to seek services at the local DSS.    Conditions: mechanical fall on 11/04/2018 resulting in Closed fracture of distal end of right fibula, unspecified fracture morphology, CAD, Insulin dependent DM, s/p angioplasty with coronary stent, DM retinopathy of both eyes, cirrhosis of liver, former smoker, GAD, STEMI, polycythemia vera, bronchitis, HTN, hx of gallbladder calculus, hyperlipidemia, remote history of C. 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 Patient gave verbal consent for services.  Plan: [facility] RN CM will attempt to await a return call from [facility] at Dr Percell Miller office to attempt to assist with possible home health services vs personal care services within 4 business days  Cm will plan for case closure if no home health services needed   [place]. Lavina Hamman, RN, BSN, [place] Coordinator Office number [phone] Mobile number [phone]  Main [facility] number [phone] Fax number [phone]

## 2018-11-08 NOTE — Patient Outreach (Signed)
[place] [facility]) Care Management  11/08/2018  Amanda [name] [birth year] [id number]   Care coordination/case closure  [facility] RN CM received a return call from [facility] at Dr Debroah Loop office. CM confirmed with [name] that Amanda [name] is non weight bearing therefore [facility] PT/OT is not an option at this time. Also confirmed Amanda [name] request was only for personal care services provider which is an out of pocket expense not covered by insurance carrier Amanda [name] plans to follow up with local DSS for medicaid application  [facility] SW services for online or a mailed list of personal care service providers was denied by Amanda [name]   Plan [facility] RN CM will close case at this time as patient has been assessed and no needs identified/needs resolved.   [facility] RN CM sent a successful outreach letter as discussed with [facility] brochure enclosed for review   Amanda L. Lavina Hamman, RN, BSN, [place] Coordinator Office number [phone] Mobile number [phone]  Main [facility] number [phone] Fax number [phone]

## 2018-11-08 NOTE — Telephone Encounter (Signed)
Copied from [place]. Topic: General - Other >> Nov 08, 2018 10:11 AM Keene Breath wrote: Reason for CRM: Patient called to request to speak with the doctor or nurse regarding his wife falling on this past Saturday.  Husband stated that she broke her ankle.  Husband said he did not want an appointment, but he needed to speak with the doctor about various issues.  Please advise and call patient back at [phone]

## 2018-11-09 ENCOUNTER — Other Ambulatory Visit: Payer: Self-pay | Admitting: Adult Health

## 2018-11-09 DIAGNOSIS — R531 Weakness: Secondary | ICD-10-CM | POA: Diagnosis not present

## 2018-11-09 DIAGNOSIS — S8291XA Unspecified fracture of right lower leg, initial encounter for closed fracture: Secondary | ICD-10-CM | POA: Diagnosis not present

## 2018-11-09 MED ORDER — OXYCODONE-ACETAMINOPHEN 5-325 MG PO TABS: 1.0000 | ORAL_TABLET | Freq: Three times a day (TID) | ORAL | 0 refills | Status: DC | PRN

## 2018-11-09 NOTE — Telephone Encounter (Signed)
I have sent in 15 tabs of percocet as this is all I can send in.   They can see if insurance will cover out patient PT. Or they can see if ortho can do anything when they follow up with them

## 2018-11-09 NOTE — Telephone Encounter (Signed)
Patient informed and verbalized understanding

## 2018-11-13 DIAGNOSIS — M25571 Pain in right ankle and joints of right foot: Secondary | ICD-10-CM | POA: Diagnosis not present

## 2018-11-16 ENCOUNTER — Encounter: Payer: Self-pay | Admitting: Adult Health

## 2018-11-16 ENCOUNTER — Ambulatory Visit (INDEPENDENT_AMBULATORY_CARE_PROVIDER_SITE_OTHER): Payer: PPO | Admitting: Adult Health

## 2018-11-16 VITALS — BP 144/80 | HR 89 | Temp 97.9°F

## 2018-11-16 DIAGNOSIS — S82831D Other fracture of upper and lower end of right fibula, subsequent encounter for closed fracture with routine healing: Secondary | ICD-10-CM

## 2018-11-16 DIAGNOSIS — R6889 Other general symptoms and signs: Secondary | ICD-10-CM | POA: Diagnosis not present

## 2018-11-16 LAB — T4, FREE: FREE T4: 0.91 ng/dL (ref 0.60–1.60)

## 2018-11-16 LAB — TSH: TSH: 4.66 u[IU]/mL — ABNORMAL HIGH (ref 0.35–4.50)

## 2018-11-16 LAB — BASIC METABOLIC PANEL
BUN: 25 mg/dL — ABNORMAL HIGH (ref 6–23)
CO2: 22 meq/L (ref 19–32)
Calcium: 9.2 mg/dL (ref 8.4–10.5)
Chloride: 103 mEq/L (ref 96–112)
Creatinine, Ser: 1.38 mg/dL — ABNORMAL HIGH (ref 0.40–1.20)
GFR: 36.86 mL/min — ABNORMAL LOW (ref >60.00)
GLUCOSE: 202 mg/dL — AB (ref 70–99)
Potassium: 4.1 mEq/L (ref 3.5–5.1)
Sodium: 137 mEq/L (ref 135–145)

## 2018-11-16 NOTE — Progress Notes (Signed)
Subjective:    Patient ID: Amanda [name], female    DOB: [birth year], 78 y.o.   MRN: [id number]  HPI  78 year old female who  has a past medical history of Allergic rhinitis (01/23/2016), C. difficile colitis, CAD (coronary artery disease), Cancer ([place]), Candidiasis of skin (09/30/2014), Cirrhosis ([place]), Depression, Diabetes mellitus (2008), Gout, Herpes simplex, Hyperglycemia (05/31/2013), Hyperlipidemia, Hypertension, Myocardial infarction [facility]), Neuromuscular disorder ([place]), Obesity, Polycythemia, and Psoriasis.  She presents to the office today for for follow up after recent ER visit.  She was seen in the ED on 11/04/2018 after mechanical fall at home.  She had a closed fracture of distal end of right fibula.  Was seen by orthopedics on Monday of this week and had hard cast placed.  She was prescribed oxycodone for pain but has not been managing with Tylenol.  She reports that she has taken 2 doses of the oxycodone.  She continues to be nonweightbearing and will have a follow-up with orthopedics next week.  Her biggest complaint today is that of always feeling cold.  Her husband who is with her reports that he has a heat set on 77 degrees in their home, but despite this she has to wear multiple blankets this stay warm.  Is been an issue for multiple months   Review of Systems See HPI   Past Medical History:  Diagnosis Date  . Allergic rhinitis 01/23/2016  . C. difficile colitis   . CAD (coronary artery disease)    a. s/p STEMI in 01/2015 with 95% LCx stenosis and distal 80% LCx stenosis (DESx2 placed)  . Cancer ([place])    SKIN  . Candidiasis of skin 09/30/2014  . Cirrhosis ([place])   . Depression   . Diabetes mellitus 2008  . Gout   . Herpes simplex   . Hyperglycemia 05/31/2013  . Hyperlipidemia   . Hypertension   . Myocardial infarction ([place])   . Neuromuscular disorder ([place])    BELL PALSY  . Obesity   . Polycythemia    Dr. Elease Hashimoto- [facility]  . Psoriasis     Social History     Socioeconomic History  . Marital status: Married    Spouse name: Not on file  . Number of children: 2  . Years of education: 2 yr colle  . Highest education level: Not on file  Occupational History  . Occupation: housewife  Social Needs  . Financial resource strain: Not on file  . Food insecurity:    Worry: Not on file    Inability: Not on file  . Transportation needs:    Medical: Not on file    Non-medical: Not on file  Tobacco Use  . Smoking status: Former Smoker    Years: 48.00    Types: Cigarettes    Last attempt to quit: 10/18/1986    Years since quitting: 32.1  . Smokeless tobacco: Never Used  Substance and Sexual Activity  . Alcohol use: No    Alcohol/week: 0.0 standard drinks  . Drug use: No  . Sexual activity: Yes    Partners: Male    Birth control/protection: None  Lifestyle  . Physical activity:    Days per week: Not on file    Minutes per session: Not on file  . Stress: Not on file  Relationships  . Social connections:    Talks on phone: Not on file    Gets together: Not on file    Attends religious service: Not on file  Active member of club or organization: Not on file    Attends meetings of clubs or organizations: Not on file    Relationship status: Not on file  . Intimate partner violence:    Fear of current or ex partner: Not on file    Emotionally abused: Not on file    Physically abused: Not on file    Forced sexual activity: Not on file  Other Topics Concern  . Not on file  Social History Narrative   2 caffeine drink per day.  No regular exercise.     Retired from the bank   2 children (Daughter and a son) son has morbid obesity   2 grandchildren   3 great grandchildren    Past Surgical History:  Procedure Laterality Date  . BIOPSY  05/26/2018   Procedure: BIOPSY;  Surgeon: [name], MD;  Location[facility] ENDOSCOPY;  Service: Endoscopy;;  . BREAST SURGERY Left    milk duct  . CATARACT EXTRACTION, BILATERAL    . COLONOSCOPY    .  ESOPHAGOGASTRODUODENOSCOPY N/A 05/26/2018   Procedure: ESOPHAGOGASTRODUODENOSCOPY (EGD);  Surgeon: [name], MD;  Location[facility] ENDOSCOPY;  Service: Endoscopy;  Laterality: N/A;  . IR PARACENTESIS  05/25/2018  . LEFT HEART CATHETERIZATION WITH CORONARY ANGIOGRAM N/A 01/27/2015   Procedure: LEFT HEART CATHETERIZATION WITH CORONARY ANGIOGRAM;  Surgeon: [name], MD;  Location: [facility] CATH LAB;  Service: Cardiovascular;  Laterality: N/A;  . TONSILLECTOMY    . TOTAL ABDOMINAL HYSTERECTOMY W/ BILATERAL SALPINGOOPHORECTOMY     for heavy periods with appendectomy    Family History  Problem Relation Age of Onset  . Diabetes Mother   . Heart disease Mother        CAD  . Hyperlipidemia Mother   . Hypertension Mother   . Kidney disease Mother   . Kidney disease Father   . Breast cancer Maternal Aunt   . Heart disease Maternal Grandmother   . Heart disease Other        maternal aunts and uncles  . Colon cancer Neg Hx   . Esophageal cancer Neg Hx     Allergies  Allergen Reactions  . Penicillins Swelling    Has patient had a PCN reaction causing immediate rash, facial/tongue/throat swelling, SOB or lightheadedness with hypotension: No Has patient had a PCN reaction causing severe rash involving mucus membranes or skin necrosis: No Has patient had a PCN reaction that required hospitalization: No Has patient had a PCN reaction occurring within the last 10 years: No If all of the above answers are "NO", then may proceed with Cephalosporin use.  Marland Kitchen Lisinopril Cough  . Tape Hives  . Doxycycline Hives, Swelling and Rash  . Latex Hives, Itching and Rash    Current Outpatient Medications on File Prior to Visit  Medication Sig Dispense Refill  . acetaminophen (TYLENOL) 325 MG tablet Take 2 tablets (650 mg total) by mouth every 6 (six) hours as needed for mild pain (or Fever >/= 101).    Marland Kitchen amLODipine (NORVASC) 5 MG tablet Take 1 tablet (5 mg total) by mouth daily. 90 tablet 3  . Blood Glucose  Monitoring Suppl (ACCU-CHEK GUIDE) w/Device KIT 1 kit by Does not apply route as directed. 1 kit 0  . clopidogrel (PLAVIX) 75 MG tablet TAKE 1 TABLET(75 MG) BY MOUTH DAILY 90 tablet 3  . diphenoxylate-atropine (LOMOTIL) 2.5-0.025 MG tablet Take 1 tablet by mouth twice daily as needed for diarrhea. 50 tablet 2  . Dulaglutide (TRULICITY) 1.5 QA/8.3MH  SOPN Inject 1.5 mg into the skin once a week. 4 pen 1  . ezetimibe (ZETIA) 10 MG tablet Take 1 tablet (10 mg total) by mouth daily. 90 tablet 3  . fluticasone (FLONASE) 50 MCG/ACT nasal spray Place 1 spray into both nostrils 2 (two) times daily. 16 g 3  . furosemide (LASIX) 40 MG tablet Take 1 tablet (40 mg total) by mouth daily. 90 tablet 3  . glucose blood (ACCU-CHEK GUIDE) test strip Use to check blood sugars 3 times daily 100 each 12  . insulin aspart (NOVOLOG) 100 UNIT/ML injection Inject 0-9 Units into the skin 3 (three) times daily with meals. 10 mL 11  . Insulin Pen Needle (BD PEN NEEDLE NANO U/F) 32G X 4 MM MISC USE AS DIRECTED TO INJECT INSULIN 100 each 0  . latanoprost (XALATAN) 0.005 % ophthalmic solution     . lipase/protease/amylase (CREON) 36000 UNITS CPEP capsule Take 1 capsule (36,000 Units total) by mouth 3 (three) times daily with meals. 180 capsule 3  . loperamide (IMODIUM A-D) 2 MG tablet Take 1-2 tablets (2-4 mg total) by mouth daily as needed for diarrhea or loose stools. 30 tablet 1  . loperamide (IMODIUM A-D) 2 MG tablet Take 1 tablet by mouth twice daily for diarrhea. 50 tablet 2  . losartan (COZAAR) 100 MG tablet Take 100 mg by mouth daily.  0  . mirtazapine (REMERON) 7.5 MG tablet TAKE 1 TABLET(7.5 MG) BY MOUTH AT BEDTIME 90 tablet 1  . ondansetron (ZOFRAN ODT) 4 MG disintegrating tablet Take 1 tablet (4 mg total) by mouth every 8 (eight) hours as needed. (Patient taking differently: Take 4 mg by mouth every 8 (eight) hours as needed for nausea or vomiting. ) 10 tablet 0  . ONETOUCH VERIO test strip USE TO TEST BLOOD SUGAR  THREE TIMES DAILY 300 each 2  . oxyCODONE-acetaminophen (PERCOCET/ROXICET) 5-325 MG tablet Take 1-2 tablets by mouth every 8 (eight) hours as needed for severe pain. 15 tablet 0  . promethazine (PHENERGAN) 25 MG tablet Take 0.5 tablets (12.5 mg total) by mouth every 6 (six) hours as needed for nausea or vomiting. 30 tablet 1  . rosuvastatin (CRESTOR) 20 MG tablet TAKE 1 TABLET(20 MG) BY MOUTH DAILY 90 tablet 3  . ruxolitinib phosphate (JAKAFI) 25 MG tablet Take 1 tablet (25 mg total) by mouth 2 (two) times daily. 60 tablet 6  . spironolactone (ALDACTONE) 50 MG tablet Take 2 tablets (100 mg total) by mouth daily. 60 tablet 6  . triamcinolone ointment (KENALOG) 0.5 % Apply 1 application topically 2 (two) times daily. 30 g 0   No current facility-administered medications on file prior to visit.     BP (!) 144/80   Pulse 89   Temp 97.9 F (36.6 C)   SpO2 95%       Objective:   Physical Exam Vitals signs and nursing note reviewed.  Constitutional:      Appearance: Normal appearance.  Cardiovascular:     Rate and Rhythm: Normal rate and regular rhythm.  Pulmonary:     Effort: Pulmonary effort is normal.     Breath sounds: Normal breath sounds.  Musculoskeletal:        General: Tenderness present. No swelling.     Comments: Right foot and ankle in hard cast.  No acute edema noted no discoloration of toes and she has full feeling in distal tips  Skin:    General: Skin is warm and dry.  Neurological:  Mental Status: She is alert.  Psychiatric:        Mood and Affect: Mood normal.        Behavior: Behavior normal.        Thought Content: Thought content normal.        Judgment: Judgment normal.       Assessment & Plan:  1. Cold intolerance -Recently had a CBC, iron and TIBC with ferritin done at the beginning of this month.  These labs were essentially normal. Kidney function had decreased slightly at this time.  Check BMP, TSH, and T4 to find possible cause of cold  intolerance.  Advised that it might be multifactorial relating to chronic kidney disease, diabetes and possibly being on a blood thinner. - Basic Metabolic Panel - TSH - T4, Free  2. Closed fracture of distal end of right fibula with routine healing, unspecified fracture morphology, subsequent encounter - Continue with orthopedics plan of care  [name], NP

## 2018-11-17 ENCOUNTER — Other Ambulatory Visit: Payer: Self-pay | Admitting: Adult Health

## 2018-11-17 ENCOUNTER — Other Ambulatory Visit: Payer: Self-pay | Admitting: Family Medicine

## 2018-11-17 MED ORDER — LEVOTHYROXINE SODIUM 25 MCG PO TABS: 25.0000 ug | ORAL_TABLET | Freq: Every day | ORAL | 0 refills | Status: DC

## 2018-11-21 ENCOUNTER — Ambulatory Visit: Payer: PPO | Admitting: Internal Medicine

## 2018-11-27 DIAGNOSIS — M25571 Pain in right ankle and joints of right foot: Secondary | ICD-10-CM | POA: Diagnosis not present

## 2018-12-05 MED FILL — JAKAFI 25 MG TABLET: 25 | 30 days supply | Qty: 60 | Fill #5

## 2018-12-08 ENCOUNTER — Other Ambulatory Visit: Payer: PPO

## 2018-12-10 DIAGNOSIS — R531 Weakness: Secondary | ICD-10-CM | POA: Diagnosis not present

## 2018-12-10 DIAGNOSIS — S8291XA Unspecified fracture of right lower leg, initial encounter for closed fracture: Secondary | ICD-10-CM | POA: Diagnosis not present

## 2018-12-18 ENCOUNTER — Telehealth: Payer: Self-pay | Admitting: Adult Health

## 2018-12-18 DIAGNOSIS — M25571 Pain in right ankle and joints of right foot: Secondary | ICD-10-CM | POA: Diagnosis not present

## 2018-12-18 NOTE — Telephone Encounter (Signed)
Copied from [place] [id number]. Topic: General - Other >> Dec 18, 2018  4:23 PM Percell Belt A wrote: Reason for CRM:   Pt husband called in and stated that 2 months ago pt broke her ankle and ever since then she has not been herself.  She is not eating.  She is not hungry. When does eat it feels like it is going to come back up.  She also has a feeling that If she get up she is going to fall.  Pt husband requested to speak with Tommi Rumps or nurse to talk about what is going on  Best number  [phone]

## 2018-12-18 NOTE — Telephone Encounter (Signed)
Pt has no DPR on file with permission to speak with anyone.  Spoke with husband and advised pt likely needs OV to evaluate symptoms. He will call back to schedule for this week. Also advised to have pt complete DPR when in office next.

## 2018-12-23 ENCOUNTER — Other Ambulatory Visit: Payer: Self-pay | Admitting: Adult Health

## 2018-12-26 ENCOUNTER — Encounter: Payer: Self-pay | Admitting: Adult Health

## 2018-12-26 ENCOUNTER — Other Ambulatory Visit: Payer: Self-pay

## 2018-12-26 ENCOUNTER — Ambulatory Visit (INDEPENDENT_AMBULATORY_CARE_PROVIDER_SITE_OTHER): Payer: PPO | Admitting: Adult Health

## 2018-12-26 ENCOUNTER — Other Ambulatory Visit: Payer: Self-pay | Admitting: Adult Health

## 2018-12-26 VITALS — BP 128/70 | HR 86 | Temp 97.9°F | Wt 222.0 lb

## 2018-12-26 DIAGNOSIS — K2100 Gastro-esophageal reflux disease with esophagitis, without bleeding: Secondary | ICD-10-CM

## 2018-12-26 MED ORDER — OMEPRAZOLE 20 MG PO CPDR: 20.0000 mg | DELAYED_RELEASE_CAPSULE | Freq: Every day | ORAL | 0 refills | Status: DC

## 2018-12-26 NOTE — Telephone Encounter (Signed)
Denied.  [place] 1 YEAR ON 09/19/2018.

## 2018-12-26 NOTE — Progress Notes (Signed)
Subjective:    Patient ID: Amanda [name], female    DOB: 03-Jul-1941, 78 y.o.   MRN: [id number]  HPI 78 year old female who  has a past medical history of Allergic rhinitis (01/23/2016), C. difficile colitis, CAD (coronary artery disease), Cancer ([place]), Candidiasis of skin (09/30/2014), Cirrhosis ([place]), Depression, Diabetes mellitus (2008), Gout, Herpes simplex, Hyperglycemia (05/31/2013), Hyperlipidemia, Hypertension, Myocardial infarction [facility]), Neuromuscular disorder ([place]), Obesity, Polycythemia, and Psoriasis.  She presents to the office today with the complaint of nausea and decreased appeitite x 2 months. She reports that when she does eat that she will only eat half of what she would eat normally. The nausea she is experiencing is constant but is worse in the morning and evening. She has not experiencing vomiting, diarrhea, or abdominal pain.   She has not started any new medications.   She reports having to force herself to eat but she will go days without eating due to the nausea.   She does report burning sensation in her stomach and when she lays down at night and she will wake up in the morning with a sour taste in her mouth   She is not taking any of her anti nausea medications   Wt Readings from Last 3 Encounters:  12/26/18 222 lb (100.7 kg)  11/04/18 216 lb (98 kg)  10/25/18 227 lb 8 oz (103.2 kg)     Review of Systems See HPI   Past Medical History:  Diagnosis Date  . Allergic rhinitis 01/23/2016  . C. difficile colitis   . CAD (coronary artery disease)    a. s/p STEMI in 01/2015 with 95% LCx stenosis and distal 80% LCx stenosis (DESx2 placed)  . Cancer ([place])    SKIN  . Candidiasis of skin 09/30/2014  . Cirrhosis ([place])   . Depression   . Diabetes mellitus 2008  . Gout   . Herpes simplex   . Hyperglycemia 05/31/2013  . Hyperlipidemia   . Hypertension   . Myocardial infarction ([place])   . Neuromuscular disorder ([place])    BELL PALSY  . Obesity   . Polycythemia      Dr. Elease Hashimoto- [facility]  . Psoriasis     Social History   Socioeconomic History  . Marital status: Married    Spouse name: Not on file  . Number of children: 2  . Years of education: 2 yr colle  . Highest education level: Not on file  Occupational History  . Occupation: housewife  Social Needs  . Financial resource strain: Not on file  . Food insecurity:    Worry: Not on file    Inability: Not on file  . Transportation needs:    Medical: Not on file    Non-medical: Not on file  Tobacco Use  . Smoking status: Former Smoker    Years: 48.00    Types: Cigarettes    Last attempt to quit: 10/18/1986    Years since quitting: 32.2  . Smokeless tobacco: Never Used  Substance and Sexual Activity  . Alcohol use: No    Alcohol/week: 0.0 standard drinks  . Drug use: No  . Sexual activity: Yes    Partners: Male    Birth control/protection: None  Lifestyle  . Physical activity:    Days per week: Not on file    Minutes per session: Not on file  . Stress: Not on file  Relationships  . Social connections:    Talks on phone: Not on file  Gets together: Not on file    Attends religious service: Not on file    Active member of club or organization: Not on file    Attends meetings of clubs or organizations: Not on file    Relationship status: Not on file  . Intimate partner violence:    Fear of current or ex partner: Not on file    Emotionally abused: Not on file    Physically abused: Not on file    Forced sexual activity: Not on file  Other Topics Concern  . Not on file  Social History Narrative   2 caffeine drink per day.  No regular exercise.     Retired from the bank   2 children (Daughter and a son) son has morbid obesity   2 grandchildren   3 great grandchildren    Past Surgical History:  Procedure Laterality Date  . BIOPSY  05/26/2018   Procedure: BIOPSY;  Surgeon: [name], MD;  Location: [facility] ENDOSCOPY;  Service: Endoscopy;;  . BREAST SURGERY Left     milk duct  . CATARACT EXTRACTION, BILATERAL    . COLONOSCOPY    . ESOPHAGOGASTRODUODENOSCOPY N/A 05/26/2018   Procedure: ESOPHAGOGASTRODUODENOSCOPY (EGD);  Surgeon: [name], MD;  Location: [facility] ENDOSCOPY;  Service: Endoscopy;  Laterality: N/A;  . IR PARACENTESIS  05/25/2018  . LEFT HEART CATHETERIZATION WITH CORONARY ANGIOGRAM N/A 01/27/2015   Procedure: LEFT HEART CATHETERIZATION WITH CORONARY ANGIOGRAM;  Surgeon: [name], MD;  Location: [facility] CATH LAB;  Service: Cardiovascular;  Laterality: N/A;  . TONSILLECTOMY    . TOTAL ABDOMINAL HYSTERECTOMY W/ BILATERAL SALPINGOOPHORECTOMY     for heavy periods with appendectomy    Family History  Problem Relation Age of Onset  . Diabetes Mother   . Heart disease Mother        CAD  . Hyperlipidemia Mother   . Hypertension Mother   . Kidney disease Mother   . Kidney disease Father   . Breast cancer Maternal Aunt   . Heart disease Maternal Grandmother   . Heart disease Other        maternal aunts and uncles  . Colon cancer Neg Hx   . Esophageal cancer Neg Hx     Allergies  Allergen Reactions  . Penicillins Swelling    Has patient had a PCN reaction causing immediate rash, facial/tongue/throat swelling, SOB or lightheadedness with hypotension: No Has patient had a PCN reaction causing severe rash involving mucus membranes or skin necrosis: No Has patient had a PCN reaction that required hospitalization: No Has patient had a PCN reaction occurring within the last 10 years: No If all of the above answers are "NO", then may proceed with Cephalosporin use.  Marland Kitchen Lisinopril Cough  . Tape Hives  . Doxycycline Hives, Swelling and Rash  . Latex Hives, Itching and Rash    Current Outpatient Medications on File Prior to Visit  Medication Sig Dispense Refill  . acetaminophen (TYLENOL) 325 MG tablet Take 2 tablets (650 mg total) by mouth every 6 (six) hours as needed for mild pain (or Fever >/= 101).    Marland Kitchen amLODipine (NORVASC) 5 MG tablet Take 1  tablet (5 mg total) by mouth daily. 90 tablet 3  . Blood Glucose Monitoring Suppl (ACCU-CHEK GUIDE) w/Device KIT 1 kit by Does not apply route as directed. 1 kit 0  . clopidogrel (PLAVIX) 75 MG tablet TAKE 1 TABLET(75 MG) BY MOUTH DAILY 90 tablet 3  . diphenoxylate-atropine (LOMOTIL) 2.5-0.025 MG tablet Take 1 tablet  by mouth twice daily as needed for diarrhea. 50 tablet 2  . Dulaglutide (TRULICITY) 1.5 OV/7.8HY SOPN Inject 1.5 mg into the skin once a week. 4 pen 1  . fluticasone (FLONASE) 50 MCG/ACT nasal spray Place 1 spray into both nostrils 2 (two) times daily. 16 g 3  . furosemide (LASIX) 40 MG tablet Take 1 tablet (40 mg total) by mouth daily. 90 tablet 3  . glucose blood (ACCU-CHEK GUIDE) test strip Use to check blood sugars 3 times daily 100 each 12  . insulin aspart (NOVOLOG) 100 UNIT/ML injection Inject 0-9 Units into the skin 3 (three) times daily with meals. 10 mL 11  . Insulin Pen Needle (BD PEN NEEDLE NANO U/F) 32G X 4 MM MISC USE AS DIRECTED TO INJECT INSULIN 100 each 0  . latanoprost (XALATAN) 0.005 % ophthalmic solution     . levothyroxine (SYNTHROID) 25 MCG tablet Take 1 tablet (25 mcg total) by mouth daily before breakfast. 30 tablet 0  . lipase/protease/amylase (CREON) 36000 UNITS CPEP capsule Take 1 capsule (36,000 Units total) by mouth 3 (three) times daily with meals. 180 capsule 3  . loperamide (IMODIUM A-D) 2 MG tablet Take 1-2 tablets (2-4 mg total) by mouth daily as needed for diarrhea or loose stools. 30 tablet 1  . loperamide (IMODIUM A-D) 2 MG tablet Take 1 tablet by mouth twice daily for diarrhea. 50 tablet 2  . losartan (COZAAR) 100 MG tablet Take 100 mg by mouth daily.  0  . mirtazapine (REMERON) 7.5 MG tablet TAKE 1 TABLET(7.5 MG) BY MOUTH AT BEDTIME 90 tablet 1  . ondansetron (ZOFRAN ODT) 4 MG disintegrating tablet Take 1 tablet (4 mg total) by mouth every 8 (eight) hours as needed. (Patient taking differently: Take 4 mg by mouth every 8 (eight) hours as needed for  nausea or vomiting. ) 10 tablet 0  . ONETOUCH VERIO test strip USE TO TEST BLOOD SUGAR THREE TIMES DAILY 300 each 2  . oxyCODONE-acetaminophen (PERCOCET/ROXICET) 5-325 MG tablet Take 1-2 tablets by mouth every 8 (eight) hours as needed for severe pain. 15 tablet 0  . promethazine (PHENERGAN) 25 MG tablet Take 0.5 tablets (12.5 mg total) by mouth every 6 (six) hours as needed for nausea or vomiting. 30 tablet 1  . rosuvastatin (CRESTOR) 20 MG tablet TAKE 1 TABLET(20 MG) BY MOUTH DAILY 90 tablet 3  . ruxolitinib phosphate (JAKAFI) 25 MG tablet Take 1 tablet (25 mg total) by mouth 2 (two) times daily. 60 tablet 6  . spironolactone (ALDACTONE) 50 MG tablet Take 2 tablets (100 mg total) by mouth daily. 60 tablet 6  . triamcinolone ointment (KENALOG) 0.5 % Apply 1 application topically 2 (two) times daily. 30 g 0  . ezetimibe (ZETIA) 10 MG tablet Take 1 tablet (10 mg total) by mouth daily. 90 tablet 3   No current facility-administered medications on file prior to visit.     BP 128/70 (BP Location: Left Arm, Patient Position: Sitting, Cuff Size: Large)   Pulse 86   Temp 97.9 F (36.6 C) (Oral)   Wt 222 lb (100.7 kg)   SpO2 95%   BMI 37.52 kg/m       Objective:   Physical Exam Vitals signs reviewed.  Constitutional:      Appearance: Normal appearance.  Cardiovascular:     Rate and Rhythm: Normal rate and regular rhythm.     Pulses: Normal pulses.     Heart sounds: Normal heart sounds.  Pulmonary:     Effort:  Pulmonary effort is normal.     Breath sounds: Normal breath sounds.  Abdominal:     General: Abdomen is flat. There is no distension.     Palpations: Abdomen is soft.     Tenderness: There is abdominal tenderness in the epigastric area.     Hernia: No hernia is present.  Skin:    General: Skin is warm and dry.  Neurological:     Mental Status: She is alert.  Psychiatric:        Mood and Affect: Mood normal.        Thought Content: Thought content normal.        Judgment:  Judgment normal.       Assessment & Plan:  1. Gastroesophageal reflux disease with esophagitis - Possibly related to Truclity but more likely GERD - Exam consistent with GERD. Advised bland diet for the next 2-3 days. Can take Zofran PRN  - Will have her take Prilosec for 30 days and then stop.  - Follow up if not resolved.  - omeprazole (PRILOSEC) 20 MG capsule; Take 1 capsule (20 mg total) by mouth daily.  Dispense: 30 capsule; Refill: 0  [name], NP

## 2018-12-27 DIAGNOSIS — H401133 Primary open-angle glaucoma, bilateral, severe stage: Secondary | ICD-10-CM | POA: Diagnosis not present

## 2018-12-27 DIAGNOSIS — H5340 Unspecified visual field defects: Secondary | ICD-10-CM | POA: Diagnosis not present

## 2018-12-27 DIAGNOSIS — Z961 Presence of intraocular lens: Secondary | ICD-10-CM | POA: Diagnosis not present

## 2018-12-27 LAB — HM DIABETES EYE EXAM

## 2018-12-27 NOTE — Telephone Encounter (Signed)
90 day supply denied.  Pt to only take for 30 days.

## 2018-12-28 ENCOUNTER — Ambulatory Visit: Payer: PPO | Admitting: Cardiovascular Disease

## 2019-01-01 ENCOUNTER — Ambulatory Visit: Payer: Self-pay | Admitting: Adult Health

## 2019-01-01 MED FILL — JAKAFI 25 MG TABLET: 25 | 30 days supply | Qty: 60 | Fill #6

## 2019-01-01 NOTE — Telephone Encounter (Signed)
I returned pt's call.   Spoke with husband but wife was there in the background able to answer questions he could not answer.  See triage notes.   The Prilosec she was prescribed by [name] on 12/26/2018 is causing her to be constipated.   I let pt's husband know someone would be in contact with them.  I have sent these notes to [facility] nurse pool for further disposition.    Reason for Disposition . Caller has URGENT medication question about med that PCP prescribed and triager unable to answer question  Answer Assessment - Initial Assessment Questions 1. SYMPTOMS: "Do you have any symptoms?"  Husband answered phone because wife Amanda [name]) on a long distance call on the other phone.   She was there with him though.   He did have her answer a couple of questions that he could not answer.  She   Started Prilosec a couple of days ago and has become constipated.   She usually has diarrhea.   Constipation is not a problem she has.   The constipation began after starting the Prilosec.    2. SEVERITY: If symptoms are present, ask "Are they mild, moderate or severe?"     Constipated for a couple days.   No abd pain or bloating.    Gave her Senakot last night about 9:00 PM  No results from that yet.     She is wanting to know if something else can be prescribed in place of the Prilosec that does not cause constipation.  Protocols used: MEDICATION QUESTION CALL-A-AH

## 2019-01-02 NOTE — Telephone Encounter (Signed)
LMOM for a return call.  

## 2019-01-02 NOTE — Telephone Encounter (Signed)
Spoke to Mr Krabill and advised to stop the omeprazole.  Start OTC Pepcid once the constipation has resolved.  Nothing further needed.

## 2019-01-02 NOTE — Telephone Encounter (Signed)
She can stop the medication and after constipation resolves she can try OTC pepcid for 2-3 weeks

## 2019-01-02 NOTE — Telephone Encounter (Signed)
Spoke to Mr. Amanda [name].  He said [place] has taken the medication 2 days.  Skipped a day between and still had constipation.  Please advise.

## 2019-01-02 NOTE — Telephone Encounter (Signed)
Ok to take every other day

## 2019-01-08 DIAGNOSIS — R531 Weakness: Secondary | ICD-10-CM | POA: Diagnosis not present

## 2019-01-08 DIAGNOSIS — S8291XA Unspecified fracture of right lower leg, initial encounter for closed fracture: Secondary | ICD-10-CM | POA: Diagnosis not present

## 2019-01-15 DIAGNOSIS — M25571 Pain in right ankle and joints of right foot: Secondary | ICD-10-CM | POA: Diagnosis not present

## 2019-01-15 DIAGNOSIS — M25561 Pain in right knee: Secondary | ICD-10-CM | POA: Diagnosis not present

## 2019-01-17 ENCOUNTER — Other Ambulatory Visit: Payer: Self-pay

## 2019-01-17 MED ORDER — ROSUVASTATIN CALCIUM 20 MG PO TABS: ORAL_TABLET | ORAL | 3 refills | Status: DC

## 2019-01-20 ENCOUNTER — Other Ambulatory Visit: Payer: Self-pay | Admitting: Hematology & Oncology

## 2019-01-20 DIAGNOSIS — D45 Polycythemia vera: Secondary | ICD-10-CM

## 2019-01-23 MED FILL — JAKAFI 25 MG TABLET: 25 | 30 days supply | Qty: 60 | Fill #0

## 2019-01-24 ENCOUNTER — Other Ambulatory Visit: Payer: Self-pay

## 2019-01-24 ENCOUNTER — Telehealth: Payer: Self-pay | Admitting: Family

## 2019-01-24 ENCOUNTER — Inpatient Hospital Stay: Payer: PPO | Attending: Family | Admitting: Family

## 2019-01-24 ENCOUNTER — Encounter: Payer: Self-pay | Admitting: Family

## 2019-01-24 ENCOUNTER — Inpatient Hospital Stay: Payer: PPO

## 2019-01-24 VITALS — BP 145/81 | HR 97 | Temp 98.6°F | Resp 32 | Wt 230.0 lb

## 2019-01-24 DIAGNOSIS — D45 Polycythemia vera: Secondary | ICD-10-CM

## 2019-01-24 DIAGNOSIS — D500 Iron deficiency anemia secondary to blood loss (chronic): Secondary | ICD-10-CM

## 2019-01-24 LAB — CMP (CANCER CENTER ONLY)
ALT: 15 U/L (ref 0–44)
AST: 36 U/L (ref 15–41)
Albumin: 4.3 g/dL (ref 3.5–5.0)
Alkaline Phosphatase: 92 U/L (ref 38–126)
Anion gap: 11 (ref 5–15)
BUN: 14 mg/dL (ref 8–23)
CO2: 24 mmol/L (ref 22–32)
Calcium: 8.9 mg/dL (ref 8.9–10.3)
Chloride: 104 mmol/L (ref 98–111)
Creatinine: 1.26 mg/dL — ABNORMAL HIGH (ref 0.44–1.00)
GFR, Est AFR Am: 47 mL/min — ABNORMAL LOW (ref >60)
GFR, Estimated: 40 mL/min — ABNORMAL LOW (ref >60)
Glucose, Bld: 192 mg/dL — ABNORMAL HIGH (ref 70–99)
Potassium: 3.4 mmol/L — ABNORMAL LOW (ref 3.5–5.1)
Sodium: 139 mmol/L (ref 135–145)
Total Bilirubin: 1.1 mg/dL (ref 0.3–1.2)
Total Protein: 6.8 g/dL (ref 6.5–8.1)

## 2019-01-24 LAB — CBC WITH DIFFERENTIAL (CANCER CENTER ONLY)
Abs Immature Granulocytes: 2 10*3/uL — ABNORMAL HIGH (ref 0.00–0.07)
Basophils Absolute: 0.2 10*3/uL — ABNORMAL HIGH (ref 0.0–0.1)
Basophils Relative: 1 %
Eosinophils Absolute: 0 10*3/uL (ref 0.0–0.5)
Eosinophils Relative: 0 %
HCT: 38.3 % (ref 36.0–46.0)
Hemoglobin: 12.6 g/dL (ref 12.0–15.0)
Immature Granulocytes: 9 %
Lymphocytes Relative: 13 %
Lymphs Abs: 2.8 10*3/uL (ref 0.7–4.0)
MCH: 29.8 pg (ref 26.0–34.0)
MCHC: 32.9 g/dL (ref 30.0–36.0)
MCV: 90.5 fL (ref 80.0–100.0)
Monocytes Absolute: 1.4 10*3/uL — ABNORMAL HIGH (ref 0.1–1.0)
Monocytes Relative: 7 %
Neutro Abs: 15 10*3/uL — ABNORMAL HIGH (ref 1.7–7.7)
Neutrophils Relative %: 70 %
Platelet Count: 298 10*3/uL (ref 150–400)
RBC: 4.23 MIL/uL (ref 3.87–5.11)
RDW: 18.2 % — ABNORMAL HIGH (ref 11.5–15.5)
WBC Count: 21.4 10*3/uL — ABNORMAL HIGH (ref 4.0–10.5)
nRBC: 0.5 % — ABNORMAL HIGH (ref 0.0–0.2)

## 2019-01-24 LAB — LACTATE DEHYDROGENASE: LDH: 402 U/L — ABNORMAL HIGH (ref 98–192)

## 2019-01-24 LAB — FERRITIN: Ferritin: 104 ng/mL (ref 11–307)

## 2019-01-24 LAB — IRON AND TIBC
Iron: 110 ug/dL (ref 41–142)
Saturation Ratios: 34 % (ref 21–57)
TIBC: 329 ug/dL (ref 236–444)
UIBC: 218 ug/dL (ref 120–384)

## 2019-01-24 LAB — SAVE SMEAR(SSMR), FOR PROVIDER SLIDE REVIEW

## 2019-01-24 NOTE — Telephone Encounter (Signed)
Appointments scheduled letter/calendar mailed per 4/8 los

## 2019-01-24 NOTE — Progress Notes (Signed)
Hematology and Oncology Follow Up Visit  Amanda [name] [id number] [birth year] 78 y.o. 01/24/2019   Principle Diagnosis:  [name] - JAK2 (+)  Current Therapy:   Jakafi 25 mg po BID   Interim History:  Amanda [name] is here today for follow-up. She is having a hard time with pain in her right knee. She states that she sees her orthopedist again later this month and he is aware that she is still having pain.   No episodes of bleeding but she does bruise easily on Plavix.  No fever, chills, n/v, cough, rash, dizziness, SOB, chest pain, palpitations, abdominal pain or changes in bowel or bladder habits.  She has chronic swelling in her lower extremities and takes lasix to help reduce fluid retention. Pedal pulses are 2+.  She has occasional numbness and tingling in her right hand she states due to carpal tunnel.  No lymphadenopathy noted on exam.  Her appetite comes and goes. She is hydrating and her weight is stable.  She states that her blood sugars have been fairly well controlled on Trulicity.    ECOG Performance Status: 1 - Symptomatic but completely ambulatory  Medications:  Allergies as of 01/24/2019      Reactions   Penicillins Swelling   Has patient had a PCN reaction causing immediate rash, facial/tongue/throat swelling, SOB or lightheadedness with hypotension: No Has patient had a PCN reaction causing severe rash involving mucus membranes or skin necrosis: No Has patient had a PCN reaction that required hospitalization: No Has patient had a PCN reaction occurring within the last 10 years: No If all of the above answers are "NO", then may proceed with Cephalosporin use.   Lisinopril Cough   Tape Hives   Doxycycline Hives, Swelling, Rash   Latex Hives, Itching, Rash      Medication List       Accurate as of January 24, 2019 10:15 AM. Always use your most recent med list.        Accu-Chek Guide w/Device Kit 1 kit by Does not apply route as directed.   acetaminophen  325 MG tablet Commonly known as:  TYLENOL Take 2 tablets (650 mg total) by mouth every 6 (six) hours as needed for mild pain (or Fever >/= 101).   amLODipine 5 MG tablet Commonly known as:  NORVASC Take 1 tablet (5 mg total) by mouth daily.   clopidogrel 75 MG tablet Commonly known as:  PLAVIX TAKE 1 TABLET(75 MG) BY MOUTH DAILY   diclofenac sodium 1 % Gel Commonly known as:  VOLTAREN APP 1 GRAM EXT AA Q 6 TO 8 H PRF PAIN   diphenoxylate-atropine 2.5-0.025 MG tablet Commonly known as:  LOMOTIL Take 1 tablet by mouth twice daily as needed for diarrhea.   Dulaglutide 1.5 MG/0.5ML Sopn Commonly known as:  Trulicity Inject 1.5 mg into the skin once a week.   ezetimibe 10 MG tablet Commonly known as:  ZETIA Take 1 tablet (10 mg total) by mouth daily.   fluticasone 50 MCG/ACT nasal spray Commonly known as:  Flonase Place 1 spray into both nostrils 2 (two) times daily.   furosemide 40 MG tablet Commonly known as:  LASIX Take 1 tablet (40 mg total) by mouth daily.   glucose blood test strip Commonly known as:  Accu-Chek Guide Use to check blood sugars 3 times daily   OneTouch Verio test strip Generic drug:  glucose blood USE TO TEST BLOOD SUGAR THREE TIMES DAILY   insulin aspart 100 UNIT/ML injection  Commonly known as:  novoLOG Inject 0-9 Units into the skin 3 (three) times daily with meals.   Insulin Pen Needle 32G X 4 MM Misc Commonly known as:  BD Pen Needle Nano U/F USE AS DIRECTED TO INJECT INSULIN   Jakafi 25 MG tablet Generic drug:  ruxolitinib phosphate TAKE 1 TABLET (25 MG TOTAL) BY MOUTH 2 (TWO) TIMES DAILY.   latanoprost 0.005 % ophthalmic solution Commonly known as:  XALATAN   levothyroxine 25 MCG tablet Commonly known as:  Synthroid Take 1 tablet (25 mcg total) by mouth daily before breakfast.   lipase/protease/amylase 36000 UNITS Cpep capsule Commonly known as:  CREON Take 1 capsule (36,000 Units total) by mouth 3 (three) times daily with meals.    loperamide 2 MG tablet Commonly known as:  IMODIUM A-D Take 1-2 tablets (2-4 mg total) by mouth daily as needed for diarrhea or loose stools.   loperamide 2 MG tablet Commonly known as:  Imodium A-D Take 1 tablet by mouth twice daily for diarrhea.   losartan 100 MG tablet Commonly known as:  COZAAR Take 100 mg by mouth daily.   mirtazapine 7.5 MG tablet Commonly known as:  REMERON TAKE 1 TABLET(7.5 MG) BY MOUTH AT BEDTIME   omeprazole 20 MG capsule Commonly known as:  PRILOSEC Take 1 capsule (20 mg total) by mouth daily.   ondansetron 4 MG disintegrating tablet Commonly known as:  Zofran ODT Take 1 tablet (4 mg total) by mouth every 8 (eight) hours as needed.   oxyCODONE-acetaminophen 5-325 MG tablet Commonly known as:  PERCOCET/ROXICET Take 1-2 tablets by mouth every 8 (eight) hours as needed for severe pain.   promethazine 25 MG tablet Commonly known as:  PHENERGAN Take 0.5 tablets (12.5 mg total) by mouth every 6 (six) hours as needed for nausea or vomiting.   rosuvastatin 20 MG tablet Commonly known as:  CRESTOR TAKE 1 TABLET(20 MG) BY MOUTH DAILY   spironolactone 50 MG tablet Commonly known as:  ALDACTONE Take 2 tablets (100 mg total) by mouth daily.   triamcinolone ointment 0.5 % Commonly known as:  KENALOG Apply 1 application topically 2 (two) times daily.       Allergies:  Allergies  Allergen Reactions  . Penicillins Swelling    Has patient had a PCN reaction causing immediate rash, facial/tongue/throat swelling, SOB or lightheadedness with hypotension: No Has patient had a PCN reaction causing severe rash involving mucus membranes or skin necrosis: No Has patient had a PCN reaction that required hospitalization: No Has patient had a PCN reaction occurring within the last 10 years: No If all of the above answers are "NO", then may proceed with Cephalosporin use.  Amanda [name] Kitchen Lisinopril Cough  . Tape Hives  . Doxycycline Hives, Swelling and Rash  . Latex  Hives, Itching and Rash    Past Medical History, Surgical history, Social history, and Family History were reviewed and updated.  Review of Systems: All other 10 point review of systems is negative.   Physical Exam:  weight is 230 lb (104.3 kg). Her oral temperature is 98.6 F (37 C). Her blood pressure is 145/81 (abnormal) and her pulse is 97. Her respiration is 32 (abnormal) and oxygen saturation is 95%.   Wt Readings from Last 3 Encounters:  01/24/19 230 lb (104.3 kg)  12/26/18 222 lb (100.7 kg)  11/04/18 216 lb (98 kg)    Ocular: Sclerae unicteric, pupils equal, round and reactive to light Ear-nose-throat: Oropharynx clear, dentition fair Lymphatic: No cervical or supraclavicular  adenopathy Lungs no rales or rhonchi, good excursion bilaterally Heart regular rate and rhythm, no murmur appreciated Abd soft, nontender, positive bowel sounds, no liver or spleen tip palpated on exam, no fluid wave  MSK no focal spinal tenderness, no joint edema Neuro: non-focal, well-oriented, appropriate affect Breasts: Deferred   Lab Results  Component Value Date   WBC 21.4 (H) 01/24/2019   HGB 12.6 01/24/2019   HCT 38.3 01/24/2019   MCV 90.5 01/24/2019   PLT 298 01/24/2019   Lab Results  Component Value Date   FERRITIN 82 10/25/2018   IRON 139 10/25/2018   TIBC 381 10/25/2018   UIBC 243 10/25/2018   IRONPCTSAT 36 10/25/2018   Lab Results  Component Value Date   RETICCTPCT 1.6 08/25/2018   RBC 4.23 01/24/2019   No results found for: Nils Pyle [facility] Lab Results  Component Value Date   IGA 233 03/29/2014   No results found for: Odetta Pink, SPEI   Chemistry      Component Value Date/Time   NA 139 01/24/2019 0901   NA 140 10/12/2017 1013   NA 138 01/21/2017 0806   K 3.4 (L) 01/24/2019 0901   K 3.7 10/12/2017 1013   K 4.1 01/21/2017 0806   CL 104 01/24/2019 0901   CL 103 10/12/2017 1013   CL 103  02/16/2013 0900   CO2 24 01/24/2019 0901   CO2 25 10/12/2017 1013   CO2 21 (L) 01/21/2017 0806   BUN 14 01/24/2019 0901   BUN 10 10/12/2017 1013   BUN 12.2 01/21/2017 0806   CREATININE 1.26 (H) 01/24/2019 0901   CREATININE 0.9 10/12/2017 1013   CREATININE 1.0 01/21/2017 0806      Component Value Date/Time   CALCIUM 8.9 01/24/2019 0901   CALCIUM 8.7 10/12/2017 1013   CALCIUM 9.3 01/21/2017 0806   ALKPHOS 92 01/24/2019 0901   ALKPHOS 97 (H) 10/12/2017 1013   ALKPHOS 112 01/21/2017 0806   AST 36 01/24/2019 0901   AST 37 (H) 01/21/2017 0806   ALT 15 01/24/2019 0901   ALT 25 10/12/2017 1013   ALT 16 01/21/2017 0806   BILITOT 1.1 01/24/2019 0901   BILITOT 0.89 01/21/2017 0806       Impression and Plan: Ms. Coonrod is a very pleasant 78 yo caucasian female with polycythemia vera, JAK-2 positive.  She continues to tolerate treatment with [facility] well. Platelet count and Hgb remain stable. WBC count is elevated at 21.4.   She will continue her same regimen with [facility] per Dr. [name].  We will see her back in another 3 months.  She will contact our office with any questions or concerns. We can certainly see her sooner if need be.   Laverna Peace, NP 4/8/202010:15 AM

## 2019-02-02 ENCOUNTER — Ambulatory Visit (INDEPENDENT_AMBULATORY_CARE_PROVIDER_SITE_OTHER): Payer: PPO | Admitting: Adult Health

## 2019-02-02 ENCOUNTER — Encounter: Payer: Self-pay | Admitting: Adult Health

## 2019-02-02 ENCOUNTER — Other Ambulatory Visit: Payer: Self-pay

## 2019-02-02 DIAGNOSIS — G8929 Other chronic pain: Secondary | ICD-10-CM

## 2019-02-02 DIAGNOSIS — R600 Localized edema: Secondary | ICD-10-CM

## 2019-02-02 DIAGNOSIS — M25561 Pain in right knee: Secondary | ICD-10-CM | POA: Diagnosis not present

## 2019-02-02 MED ORDER — OXYCODONE-ACETAMINOPHEN 5-325 MG PO TABS: 1.0000 | ORAL_TABLET | Freq: Three times a day (TID) | ORAL | 0 refills | Status: DC | PRN

## 2019-02-02 NOTE — Progress Notes (Signed)
Virtual Visit via Video Note  I connected with Amanda [name] on 02/02/19 at  2:00 PM EDT by a video enabled telemedicine application and verified that I am speaking with the correct person using two identifiers.  Location patient: home Location provider:work or home office Persons participating in the virtual visit: patient, provider  I discussed the limitations of evaluation and management by telemedicine and the availability of in person appointments. The patient expressed understanding and agreed to proceed.   HPI: -year-old female who is being evaluated today for right knee pain and lower extremity swelling.  Her right knee pain is a chronic issue.  She has an appointment with Amanda [name] on 3/30 for follow up.  Reports that her prior visit she was told that she "had a lot of arthritis" and was given Voltaren gel.  She reports the Voltaren gel works for about 3 hours and then wears off.  She still has a few set that I prescribed her back in January and has been taking those when the pain gets really bad.  She was advised to have physical therapy, but at that time she could not afford it.  She is not able to afford physical therapy and would like to proceed with this. The pain she is experiencing is worse with ambulation and she feels unsteady when walking with her walker. Denies falls.   She also reports bilateral lower extremity edema which is a chronic issue as well.  Edema becomes worse over the day as she is out of bed.  When she is in her chair she is keeping her feet elevated.  Is unable to use her compression socks.  Currently prescribed Lasix and spirolactone by cardiology.  It is no better but is no worse either than it usually is.   ROS: See pertinent positives and negatives per HPI.  Past Medical History:  Diagnosis Date  . Allergic rhinitis 01/23/2016  . C. difficile colitis   . CAD (coronary artery disease)    a. s/p STEMI in 01/2015 with 95% LCx stenosis and distal 80% LCx  stenosis (DESx2 placed)  . Cancer ([place])    SKIN  . Candidiasis of skin 09/30/2014  . Cirrhosis ([place])   . Depression   . Diabetes mellitus 2008  . Gout   . Herpes simplex   . Hyperglycemia 05/31/2013  . Hyperlipidemia   . Hypertension   . Myocardial infarction ([place])   . Neuromuscular disorder ([place])    BELL PALSY  . Obesity   . Polycythemia    Dr. Elease [name]- [facility]  . Psoriasis     Past Surgical History:  Procedure Laterality Date  . BIOPSY  05/26/2018   Procedure: BIOPSY;  Surgeon: [name], MD;  Location[facility] ENDOSCOPY;  Service: Endoscopy;;  . BREAST SURGERY Left    milk duct  . CATARACT EXTRACTION, BILATERAL    . COLONOSCOPY    . ESOPHAGOGASTRODUODENOSCOPY N/A 05/26/2018   Procedure: ESOPHAGOGASTRODUODENOSCOPY (EGD);  Surgeon: [name], MD;  Location: [facility] ENDOSCOPY;  Service: Endoscopy;  Laterality: N/A;  . IR PARACENTESIS  05/25/2018  . LEFT HEART CATHETERIZATION WITH CORONARY ANGIOGRAM N/A 01/27/2015   Procedure: LEFT HEART CATHETERIZATION WITH CORONARY ANGIOGRAM;  Surgeon: [name], MD;  Location: [facility] CATH LAB;  Service: Cardiovascular;  Laterality: N/A;  . TONSILLECTOMY    . TOTAL ABDOMINAL HYSTERECTOMY W/ BILATERAL SALPINGOOPHORECTOMY     for heavy periods with appendectomy    Family History  Problem Relation Age of Onset  . Diabetes Mother   .  Heart disease Mother        CAD  . Hyperlipidemia Mother   . Hypertension Mother   . Kidney disease Mother   . Kidney disease Father   . Breast cancer Maternal Aunt   . Heart disease Maternal Grandmother   . Heart disease Other        maternal aunts and uncles  . Colon cancer Neg Hx   . Esophageal cancer Neg Hx      Current Outpatient Medications:  .  acetaminophen (TYLENOL) 325 MG tablet, Take 2 tablets (650 mg total) by mouth every 6 (six) hours as needed for mild pain (or Fever >/= 101)., Disp: , Rfl:  .  amLODipine (NORVASC) 5 MG tablet, Take 1 tablet (5 mg total) by mouth daily., Disp: 90  tablet, Rfl: 3 .  Blood Glucose Monitoring Suppl (ACCU-CHEK GUIDE) w/Device KIT, 1 kit by Does not apply route as directed. (Patient not taking: Reported on 01/24/2019), Disp: 1 kit, Rfl: 0 .  clopidogrel (PLAVIX) 75 MG tablet, TAKE 1 TABLET(75 MG) BY MOUTH DAILY, Disp: 90 tablet, Rfl: 3 .  diclofenac sodium (VOLTAREN) 1 % GEL, APP 1 GRAM EXT AA Q 6 TO 8 H PRF PAIN, Disp: , Rfl:  .  diphenoxylate-atropine (LOMOTIL) 2.5-0.025 MG tablet, Take 1 tablet by mouth twice daily as needed for diarrhea., Disp: 50 tablet, Rfl: 2 .  Dulaglutide (TRULICITY) 1.5 XK/4.8JE SOPN, Inject 1.5 mg into the skin once a week., Disp: 4 pen, Rfl: 1 .  ezetimibe (ZETIA) 10 MG tablet, Take 1 tablet (10 mg total) by mouth daily., Disp: 90 tablet, Rfl: 3 .  fluticasone (FLONASE) 50 MCG/ACT nasal spray, Place 1 spray into both nostrils 2 (two) times daily. (Patient not taking: Reported on 01/24/2019), Disp: 16 g, Rfl: 3 .  furosemide (LASIX) 40 MG tablet, Take 1 tablet (40 mg total) by mouth daily., Disp: 90 tablet, Rfl: 3 .  glucose blood (ACCU-CHEK GUIDE) test strip, Use to check blood sugars 3 times daily, Disp: 100 each, Rfl: 12 .  insulin aspart (NOVOLOG) 100 UNIT/ML injection, Inject 0-9 Units into the skin 3 (three) times daily with meals., Disp: 10 mL, Rfl: 11 .  Insulin Pen Needle (BD PEN NEEDLE NANO U/F) 32G X 4 MM MISC, USE AS DIRECTED TO INJECT INSULIN, Disp: 100 each, Rfl: 0 .  JAKAFI 25 MG tablet, TAKE 1 TABLET (25 MG TOTAL) BY MOUTH 2 (TWO) TIMES DAILY., Disp: 60 tablet, Rfl: 6 .  latanoprost (XALATAN) 0.005 % ophthalmic solution, , Disp: , Rfl:  .  levothyroxine (SYNTHROID) 25 MCG tablet, Take 1 tablet (25 mcg total) by mouth daily before breakfast., Disp: 30 tablet, Rfl: 0 .  lipase/protease/amylase (CREON) 36000 UNITS CPEP capsule, Take 1 capsule (36,000 Units total) by mouth 3 (three) times daily with meals., Disp: 180 capsule, Rfl: 3 .  loperamide (IMODIUM A-D) 2 MG tablet, Take 1-2 tablets (2-4 mg total) by mouth  daily as needed for diarrhea or loose stools., Disp: 30 tablet, Rfl: 1 .  loperamide (IMODIUM A-D) 2 MG tablet, Take 1 tablet by mouth twice daily for diarrhea., Disp: 50 tablet, Rfl: 2 .  losartan (COZAAR) 100 MG tablet, Take 100 mg by mouth daily., Disp: , Rfl: 0 .  mirtazapine (REMERON) 7.5 MG tablet, TAKE 1 TABLET(7.5 MG) BY MOUTH AT BEDTIME, Disp: 90 tablet, Rfl: 1 .  omeprazole (PRILOSEC) 20 MG capsule, Take 1 capsule (20 mg total) by mouth daily., Disp: 30 capsule, Rfl: 0 .  ondansetron (ZOFRAN ODT)  4 MG disintegrating tablet, Take 1 tablet (4 mg total) by mouth every 8 (eight) hours as needed. (Patient not taking: Reported on 01/24/2019), Disp: 10 tablet, Rfl: 0 .  ONETOUCH VERIO test strip, USE TO TEST BLOOD SUGAR THREE TIMES DAILY, Disp: 300 each, Rfl: 2 .  oxyCODONE-acetaminophen (PERCOCET/ROXICET) 5-325 MG tablet, Take 1-2 tablets by mouth every 8 (eight) hours as needed for severe pain., Disp: 15 tablet, Rfl: 0 .  promethazine (PHENERGAN) 25 MG tablet, Take 0.5 tablets (12.5 mg total) by mouth every 6 (six) hours as needed for nausea or vomiting., Disp: 30 tablet, Rfl: 1 .  rosuvastatin (CRESTOR) 20 MG tablet, TAKE 1 TABLET(20 MG) BY MOUTH DAILY, Disp: 90 tablet, Rfl: 3 .  spironolactone (ALDACTONE) 50 MG tablet, Take 2 tablets (100 mg total) by mouth daily., Disp: 60 tablet, Rfl: 6 .  triamcinolone ointment (KENALOG) 0.5 %, Apply 1 application topically 2 (two) times daily., Disp: 30 g, Rfl: 0  EXAM:  VITALS per patient if applicable:  GENERAL: alert, oriented, appears well and in no acute distress  HEENT: atraumatic, conjunttiva clear, no obvious abnormalities on inspection of external nose and ears  NECK: normal movements of the head and neck  LUNGS: on inspection no signs of respiratory distress, breathing rate appears normal, no obvious gross SOB, gasping or wheezing  CV: no obvious cyanosis  MS: moves all visible extremities without noticeable abnormality  PSYCH/NEURO:  pleasant and cooperative, no obvious depression or anxiety, speech and thought processing grossly intact  ASSESSMENT AND PLAN:  Discussed the following assessment and plan:  No diagnosis found.     I discussed the assessment and treatment plan with the patient. The patient was provided an opportunity to ask questions and all were answered. The patient agreed with the plan and demonstrated an understanding of the instructions.   The patient was advised to call back or seek an in-person evaluation if the symptoms worsen or if the condition fails to improve as anticipated.   [name], NP

## 2019-02-05 ENCOUNTER — Other Ambulatory Visit: Payer: Self-pay | Admitting: Adult Health

## 2019-02-05 DIAGNOSIS — F329 Major depressive disorder, single episode, unspecified: Secondary | ICD-10-CM

## 2019-02-05 NOTE — Telephone Encounter (Signed)
Sent to the pharmacy by e-scribe. 

## 2019-02-07 DIAGNOSIS — M25561 Pain in right knee: Secondary | ICD-10-CM | POA: Diagnosis not present

## 2019-02-08 DIAGNOSIS — S8291XA Unspecified fracture of right lower leg, initial encounter for closed fracture: Secondary | ICD-10-CM | POA: Diagnosis not present

## 2019-02-08 DIAGNOSIS — R531 Weakness: Secondary | ICD-10-CM | POA: Diagnosis not present

## 2019-02-09 ENCOUNTER — Telehealth: Payer: Self-pay | Admitting: *Deleted

## 2019-02-09 NOTE — Telephone Encounter (Signed)
Have no problem referring her to the physical therapist office over here but I thought she cannot get out of her house safely

## 2019-02-09 NOTE — Telephone Encounter (Signed)
Copied from [place] [id number]. Topic: Referral - Question >> Feb 09, 2019 10:59 AM Sheran Luz wrote: Reason for CRM: Patient states that [facility] is a little too expensive. She would like to know if it can be switched to "[facility]". Please advise?

## 2019-02-13 ENCOUNTER — Encounter: Payer: Self-pay | Admitting: Internal Medicine

## 2019-02-13 ENCOUNTER — Other Ambulatory Visit: Payer: Self-pay | Admitting: Gastroenterology

## 2019-02-13 NOTE — Telephone Encounter (Signed)
Left a message for a return call.

## 2019-02-14 ENCOUNTER — Other Ambulatory Visit: Payer: Self-pay | Admitting: Internal Medicine

## 2019-02-14 ENCOUNTER — Ambulatory Visit (INDEPENDENT_AMBULATORY_CARE_PROVIDER_SITE_OTHER): Payer: PPO | Admitting: Internal Medicine

## 2019-02-14 ENCOUNTER — Other Ambulatory Visit: Payer: Self-pay

## 2019-02-14 DIAGNOSIS — E669 Obesity, unspecified: Secondary | ICD-10-CM

## 2019-02-14 DIAGNOSIS — M25561 Pain in right knee: Secondary | ICD-10-CM | POA: Diagnosis not present

## 2019-02-14 DIAGNOSIS — Z794 Long term (current) use of insulin: Secondary | ICD-10-CM | POA: Diagnosis not present

## 2019-02-14 DIAGNOSIS — E1142 Type 2 diabetes mellitus with diabetic polyneuropathy: Secondary | ICD-10-CM | POA: Diagnosis not present

## 2019-02-14 MED ORDER — INSULIN REGULAR HUMAN (CONC) 500 UNIT/ML ~~LOC~~ SOPN: PEN_INJECTOR | SUBCUTANEOUS | 3 refills | Status: DC

## 2019-02-14 NOTE — Progress Notes (Signed)
Patient ID: Amanda [name], female   DOB: Nov 19, 1940, 78 y.o.   MRN: [id number]  Patient location: Home My location: Office  Referring Provider: [name], NP  I connected with the patient and her husband on 02/14/19 at  8:33 AM EDT by a video enabled telemedicine application and verified that I am speaking with the correct person.   I discussed the limitations of evaluation and management by telemedicine and the availability of in person appointments. The patient expressed understanding and agreed to proceed.   Details of the encounter are shown below.  HPI: Amanda [name] is a 78 y.o.-year-old female, presenting for f/u for DM2, dx 2008, insulin-dependent since dx, uncontrolled, with complications (DR, CKD, PN, CAD-s/p AMI 01/2015, s/p 2 stents, cerebrovascular disease-s/p stroke 03/2018). Last visit 6 months ago  Of note, she has polycythemia vera and sees Dr. [name].  She had a steroid inj in knee 2 weeks ago >> sugars higher.  Last hemoglobin A1c was: 12/2018: HbA1c 5.8% Lab Results  Component Value Date   HGBA1C 7.9 (H) 03/23/2018   HGBA1C 8.4 (H) 01/10/2018   HGBA1C 9.6 10/28/2015  03/17/2018: HbA1c calculated from fructosamine is the best in a long while, at 6.35%. 08/30/2017: HbA1c calculated from fructosamine: 7.7% 05/06/2017: HbA1c calculated from the fructosamine appears better, at 6.7%. 02/11/2016: HbA1c from  fructosamine is 7.5% 07/01/2015: HbA1c from fructosamine 6.57%. 01/27/2015: HbA1c from fructosamine: 6.57% 05/16/2014: HbA1c from fructosamine: 7.95%. - in 10/16/2012, hemoglobin A1c was 6.9% - in 07/2012, hemoglobin A1c was 9.9% - in 11/16/2010, hemoglobin A1c was 12.8% - in 03/2011, hemoglobin A1c was 6.9% - In 07/10/2010 her hemoglobin A1c was 10.5% C-peptide was 6.3 and 01/2011.   She is currently on: - U500 100 >> 80 units 2x a day - Trulicity 1.5 mg weekly Stopped Invokana 100 mg in the past b/c of negative publicity.  She could not tolerate  doses of metformin XR higher than 250 mg twice a day in the past due to diarrhea. She does not want to resume this. Was previously on the Victoza 1.8 mg daily - "made me sick". She was previously on Janumet between 2012-2013.  She checks sugars 3 times a day: - am: 170-240 >> 121 >> 135-158 >> 120-162 - 2h after b'fast: 238 >> n/c >> 167 >> n/c - before lunch: n/c >> 170-240 >> n/c - 2h after lunch: 379, 406 >> n/c >> 200-228 - before dinner:  241 >> 140-210 >> 86 >> n/c >> 225 - bedtime: 200-305 >> n/c >> 143-267 >> n/c Lowest sugar was 23 in the hospital (?!) >> ...>> 67 x1 >> ; she has hypoglycemia awareness around 100 Highest 452 ... >> 293 (cortisone inj - knee)  She was seen for nutritional advice and diabetes education, with last visit on 04/2012 - [name]..  -+ CKD, last BUN/creatinine:  Lab Results  Component Value Date   BUN 14 01/24/2019   CREATININE 1.26 (H) 01/24/2019  On Cozaar.  + HL; latest Lipid panel: Lab Results  Component Value Date   CHOL 167 03/23/2018   HDL 25 (L) 03/23/2018   LDLCALC 93 03/23/2018   LDLDIRECT 103.0 02/14/2017   TRIG 247 (H) 03/23/2018   CHOLHDL 6.7 03/23/2018  On pravastatin. - last eye exam was summer 2019: NPDR (Dr Zigmund Daniel).  + Glaucoma.  She had cataract surgery -+ Numbness and tingling in feet >> saw PCP, podiatrist, orthopedist >> tried capsaicin cream, compression stockings, currently on Neurontin and compounding neuropathy. On ASA 81.  She also has a history of HTN, diastolic dysfunction, polycytemia vera, gout, psoriasis, depression, history of Bell's palsy, obesity, vitamin D deficiency.  On 01/27/2015 she had an AMI >> had 2 stents placed then. She was admitted to the hospital on 03/22/2018 for stroke - per review of her MRI report: Punctate small vessel type acute ischemic nonhemorrhagic infarct involving the posterior right centrum semi ovale.  60  ROS: Constitutional: no weight gain/no weight loss, no fatigue, no  subjective hyperthermia, no subjective hypothermia Eyes: no blurry vision, no xerophthalmia ENT: no sore throat, no nodules palpated in neck, no dysphagia, no odynophagia, no hoarseness Cardiovascular: no CP/+ SOB/no palpitations/no leg swelling Respiratory: no cough/+ SOB/no wheezing Gastrointestinal: no N/no V/no D/no C/no acid reflux Musculoskeletal: no muscle aches/no joint aches Skin: no rashes, no hair loss Neurological: no tremors/+ numbness/+ tingling/no dizziness  I reviewed pt's medications, allergies, PMH, social hx, family hx, and changes were documented in the history of present illness. Otherwise, unchanged from my initial visit note.  Past Medical History:  Diagnosis Date  . Allergic rhinitis 01/23/2016  . C. difficile colitis   . CAD (coronary artery disease)    a. s/p STEMI in 01/2015 with 95% LCx stenosis and distal 80% LCx stenosis (DESx2 placed)  . Cancer ([employer])    SKIN  . Candidiasis of skin 09/30/2014  . Cirrhosis ([place])   . Depression   . Diabetes mellitus 2008  . Gout   . Herpes simplex   . Hyperglycemia 05/31/2013  . Hyperlipidemia   . Hypertension   . Myocardial infarction ([place])   . Neuromuscular disorder ([place])    BELL PALSY  . Obesity   . Polycythemia    Dr. Elease Hashimoto- [facility] hematology  . Psoriasis    Past Surgical History:  Procedure Laterality Date  . BIOPSY  05/26/2018   Procedure: BIOPSY;  Surgeon: [name], MD;  Location: [facility] ENDOSCOPY;  Service: Endoscopy;;  . BREAST SURGERY Left    milk duct  . CATARACT EXTRACTION, BILATERAL    . COLONOSCOPY    . ESOPHAGOGASTRODUODENOSCOPY N/A 05/26/2018   Procedure: ESOPHAGOGASTRODUODENOSCOPY (EGD);  Surgeon: [name], MD;  Location: [facility] ENDOSCOPY;  Service: Endoscopy;  Laterality: N/A;  . IR PARACENTESIS  05/25/2018  . LEFT HEART CATHETERIZATION WITH CORONARY ANGIOGRAM N/A 01/27/2015   Procedure: LEFT HEART CATHETERIZATION WITH CORONARY ANGIOGRAM;  Surgeon: [name], MD;  Location: [facility] CATH LAB;   Service: Cardiovascular;  Laterality: N/A;  . TONSILLECTOMY    . TOTAL ABDOMINAL HYSTERECTOMY W/ BILATERAL SALPINGOOPHORECTOMY     for heavy periods with appendectomy   Social History   Socioeconomic History  . Marital status: Married    Spouse name: Not on file  . Number of children: 2  . Years of education: 2 yr colle  . Highest education level: Not on file  Occupational History  . Occupation: housewife  Social Needs  . Financial resource strain: Not on file  . Food insecurity:    Worry: Not on file    Inability: Not on file  . Transportation needs:    Medical: Not on file    Non-medical: Not on file  Tobacco Use  . Smoking status: Former Smoker    Years: 48.00    Types: Cigarettes    Last attempt to quit: 10/18/1986    Years since quitting: 32.3  . Smokeless tobacco: Never Used  Substance and Sexual Activity  . Alcohol use: No    Alcohol/week: 0.0 standard drinks  . Drug use: No  .  Sexual activity: Yes    Partners: Male    Birth control/protection: None  Lifestyle  . Physical activity:    Days per week: Not on file    Minutes per session: Not on file  . Stress: Not on file  Relationships  . Social connections:    Talks on phone: Not on file    Gets together: Not on file    Attends religious service: Not on file    Active member of club or organization: Not on file    Attends meetings of clubs or organizations: Not on file    Relationship status: Not on file  . Intimate partner violence:    Fear of current or ex partner: Not on file    Emotionally abused: Not on file    Physically abused: Not on file    Forced sexual activity: Not on file  Other Topics Concern  . Not on file  Social History Narrative   2 caffeine drink per day.  No regular exercise.     Retired from the [employer]   2 children (Daughter and a son) son has morbid obesity   2 grandchildren   3 great grandchildren   Current Outpatient Medications on File Prior to Visit  Medication Sig Dispense  Refill  . acetaminophen (TYLENOL) 325 MG tablet Take 2 tablets (650 mg total) by mouth every 6 (six) hours as needed for mild pain (or Fever >/= 101).    Marland Kitchen amLODipine (NORVASC) 5 MG tablet Take 1 tablet (5 mg total) by mouth daily. 90 tablet 3  . Blood Glucose Monitoring Suppl (ACCU-CHEK GUIDE) w/Device KIT 1 kit by Does not apply route as directed. 1 kit 0  . clopidogrel (PLAVIX) 75 MG tablet TAKE 1 TABLET(75 MG) BY MOUTH DAILY 90 tablet 3  . diclofenac sodium (VOLTAREN) 1 % GEL APP 1 GRAM EXT AA Q 6 TO 8 H PRF PAIN    . diphenoxylate-atropine (LOMOTIL) 2.5-0.025 MG tablet Take 1 tablet by mouth twice daily as needed for diarrhea. 50 tablet 2  . Dulaglutide (TRULICITY) 1.5 TG/5.4DI SOPN Inject 1.5 mg into the skin once a week. 4 pen 1  . fluticasone (FLONASE) 50 MCG/ACT nasal spray Place 1 spray into both nostrils 2 (two) times daily. 16 g 3  . furosemide (LASIX) 40 MG tablet Take 1 tablet (40 mg total) by mouth daily. 90 tablet 3  . glucose blood (ACCU-CHEK GUIDE) test strip Use to check blood sugars 3 times daily 100 each 12  . insulin aspart (NOVOLOG) 100 UNIT/ML injection Inject 0-9 Units into the skin 3 (three) times daily with meals. 10 mL 11  . Insulin Pen Needle (BD PEN NEEDLE NANO U/F) 32G X 4 MM MISC USE AS DIRECTED TO INJECT INSULIN 100 each 0  . JAKAFI 25 MG tablet TAKE 1 TABLET (25 MG TOTAL) BY MOUTH 2 (TWO) TIMES DAILY. 60 tablet 6  . latanoprost (XALATAN) 0.005 % ophthalmic solution     . levothyroxine (SYNTHROID) 25 MCG tablet Take 1 tablet (25 mcg total) by mouth daily before breakfast. 30 tablet 0  . lipase/protease/amylase (CREON) 36000 UNITS CPEP capsule Take 1 capsule (36,000 Units total) by mouth 3 (three) times daily with meals. 180 capsule 3  . loperamide (IMODIUM A-D) 2 MG tablet Take 1-2 tablets (2-4 mg total) by mouth daily as needed for diarrhea or loose stools. 30 tablet 1  . loperamide (IMODIUM A-D) 2 MG tablet Take 1 tablet by mouth twice daily for diarrhea. 50  tablet  2  . losartan (COZAAR) 100 MG tablet Take 100 mg by mouth daily.  0  . mirtazapine (REMERON) 7.5 MG tablet TAKE 1 TABLET(7.5 MG) BY MOUTH AT BEDTIME 90 tablet 0  . omeprazole (PRILOSEC) 20 MG capsule Take 1 capsule (20 mg total) by mouth daily. 30 capsule 0  . ONETOUCH VERIO test strip USE TO TEST BLOOD SUGAR THREE TIMES DAILY 300 each 2  . oxyCODONE-acetaminophen (PERCOCET/ROXICET) 5-325 MG tablet Take 1-2 tablets by mouth every 8 (eight) hours as needed for severe pain. 15 tablet 0  . promethazine (PHENERGAN) 25 MG tablet Take 0.5 tablets (12.5 mg total) by mouth every 6 (six) hours as needed for nausea or vomiting. 30 tablet 1  . rosuvastatin (CRESTOR) 20 MG tablet TAKE 1 TABLET(20 MG) BY MOUTH DAILY 90 tablet 3  . triamcinolone ointment (KENALOG) 0.5 % Apply 1 application topically 2 (two) times daily. 30 g 0  . ezetimibe (ZETIA) 10 MG tablet Take 1 tablet (10 mg total) by mouth daily. 90 tablet 3  . ondansetron (ZOFRAN ODT) 4 MG disintegrating tablet Take 1 tablet (4 mg total) by mouth every 8 (eight) hours as needed. (Patient not taking: Reported on 02/13/2019) 10 tablet 0  . spironolactone (ALDACTONE) 50 MG tablet TAKE 2 TABLETS(100 MG) BY MOUTH DAILY 60 tablet 6   No current facility-administered medications on file prior to visit.    Allergies  Allergen Reactions  . Penicillins Swelling    Has patient had a PCN reaction causing immediate rash, facial/tongue/throat swelling, SOB or lightheadedness with hypotension: No Has patient had a PCN reaction causing severe rash involving mucus membranes or skin necrosis: No Has patient had a PCN reaction that required hospitalization: No Has patient had a PCN reaction occurring within the last 10 years: No If all of the above answers are "NO", then may proceed with Cephalosporin use.  Marland Kitchen Lisinopril Cough  . Tape Hives  . Doxycycline Hives, Swelling and Rash  . Latex Hives, Itching and Rash   Family History  Problem Relation Age of  Onset  . Diabetes Mother   . Heart disease Mother        CAD  . Hyperlipidemia Mother   . Hypertension Mother   . Kidney disease Mother   . Kidney disease Father   . Breast cancer Maternal Aunt   . Heart disease Maternal Grandmother   . Heart disease Other        maternal aunts and uncles  . Colon cancer Neg Hx   . Esophageal cancer Neg Hx     PE: There were no vitals taken for this visit. There is no height or weight on file to calculate BMI. Wt Readings from Last 3 Encounters:  01/24/19 230 lb (104.3 kg)  12/26/18 222 lb (100.7 kg)  11/04/18 216 lb (98 kg)   Constitutional:  in NAD  The physical exam was not performed (virtual visit).  ASSESSMENT: 1. DM2, insulin-dependent, uncontrolled, with complications - nonprolif. DR - CKD - peripheral neuropathy - CAD - s/p AMI s/p 2 stents 01/2015 - Cerebrovascular disease - s/p stroke 03/2018  2. Diabetic peripheral neuropathy  3.  Obesity  PLAN:  1. Patient with longstanding, uncontrolled, type 2 diabetes, very insulin resistant, on U500 insulin and also weekly GLP-1 receptor agonist.  She is getting both medications through the patient assistance program at last visit, sugars are improving with an average of approximately 140.  However, she did not have the sugar log with her so I could  not make significant changes in her regimen.  She did not report significant lows or highs.  We continued the same doses of O175 and also Trulicity, which she felt helped significantly.  She tolerates Trulicity well, without GI side effects -At this visit, I reviewed her sugars at home with patient and her husband: They are higher and not necessarily reflecting her most recent HbA1c from last visit, which they report was 5.8%.  Since sugars are highest after lunch and remain high towards the end of the day, we discussed about increasing the dose of U500 insulin in the morning. - I advised her to: Patient Instructions  Please increase: - U500  insulin; 90 units before breakfast  80 units before dinner  Continue: - Trulicity 1.5 mg once a week  Please return in 4 months with your sugar log.   - HbA1c levels are not accurate for her due to polycythemia vera.  We will check a fructosamine level when she comes back - continue checking sugars at different times of the day - check 2-3x a day, rotating checks - advised for yearly eye exams >> she is UTD - Return to clinic in 4 months with sugar log   2. Diabetic peripheral neuropathy -stable, no increased symptoms -ContinContinues Neurontin 600 mg twice a day -Also continues neuropathy cream: Amitriptyline 2%, lidocaine 2%, and ketamine 1%- uses this as needed  3. Obesity - lost 12 lbs and then gained 14 since last OV -Continue Trulicity which should also help with weight loss  [name], MD PhD [facility] Endocrinology

## 2019-02-14 NOTE — Patient Instructions (Signed)
Patient Instructions  Please increase: - U500 insulin; 90 units before breakfast  80 units before dinner  Continue: - Trulicity 1.5 mg once a week  Please return in 4 months with your sugar log.

## 2019-02-15 NOTE — Telephone Encounter (Signed)
Spoke to Amanda [name] and he advised that referral is no longer needed.  Order placed by [name] and will take place in the same building.  Nothing further needed.

## 2019-02-16 MED FILL — JAKAFI 25 MG TABLET: 25 | 30 days supply | Qty: 60 | Fill #1

## 2019-02-21 DIAGNOSIS — R262 Difficulty in walking, not elsewhere classified: Secondary | ICD-10-CM | POA: Diagnosis not present

## 2019-02-21 DIAGNOSIS — M25571 Pain in right ankle and joints of right foot: Secondary | ICD-10-CM | POA: Diagnosis not present

## 2019-02-21 DIAGNOSIS — M6281 Muscle weakness (generalized): Secondary | ICD-10-CM | POA: Diagnosis not present

## 2019-02-26 ENCOUNTER — Telehealth: Payer: Self-pay

## 2019-02-26 NOTE — Telephone Encounter (Signed)
Received fax from [facility] that patient has been approved. Letter sent to scan.

## 2019-02-27 DIAGNOSIS — R262 Difficulty in walking, not elsewhere classified: Secondary | ICD-10-CM | POA: Diagnosis not present

## 2019-02-27 DIAGNOSIS — M25571 Pain in right ankle and joints of right foot: Secondary | ICD-10-CM | POA: Diagnosis not present

## 2019-02-27 DIAGNOSIS — M6281 Muscle weakness (generalized): Secondary | ICD-10-CM | POA: Diagnosis not present

## 2019-02-28 ENCOUNTER — Ambulatory Visit (INDEPENDENT_AMBULATORY_CARE_PROVIDER_SITE_OTHER): Payer: PPO | Admitting: Gastroenterology

## 2019-02-28 ENCOUNTER — Other Ambulatory Visit: Payer: Self-pay

## 2019-02-28 ENCOUNTER — Encounter: Payer: Self-pay | Admitting: Gastroenterology

## 2019-02-28 ENCOUNTER — Other Ambulatory Visit (INDEPENDENT_AMBULATORY_CARE_PROVIDER_SITE_OTHER): Payer: PPO

## 2019-02-28 ENCOUNTER — Other Ambulatory Visit: Payer: Self-pay | Admitting: Gastroenterology

## 2019-02-28 VITALS — Ht 64.0 in | Wt 229.0 lb

## 2019-02-28 DIAGNOSIS — K7460 Unspecified cirrhosis of liver: Secondary | ICD-10-CM | POA: Diagnosis not present

## 2019-02-28 LAB — BASIC METABOLIC PANEL
BUN: 16 mg/dL (ref 6–23)
CO2: 28 mEq/L (ref 19–32)
Calcium: 9 mg/dL (ref 8.4–10.5)
Chloride: 104 mEq/L (ref 96–112)
Creatinine, Ser: 1.3 mg/dL — ABNORMAL HIGH (ref 0.40–1.20)
GFR: 39.46 mL/min — ABNORMAL LOW (ref >60.00)
Glucose, Bld: 136 mg/dL — ABNORMAL HIGH (ref 70–99)
Potassium: 3.2 mEq/L — ABNORMAL LOW (ref 3.5–5.1)
Sodium: 142 mEq/L (ref 135–145)

## 2019-02-28 MED ORDER — SPIRONOLACTONE 100 MG PO TABS: ORAL_TABLET | ORAL | 6 refills | Status: DC

## 2019-02-28 NOTE — Patient Instructions (Addendum)
She needs an in person office visit with me on Monday, May 18.  My office will call her to arrange exact timing.   She needs basic metabolic profile today.  I will adjust her diuretics upward pending review of those results.

## 2019-02-28 NOTE — Progress Notes (Signed)
Review of pertinent gastrointestinal problems: 1. 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  AFP 08/2014 normal... 01/2018 4.7 (normal)  EGD 02/2014 no portal hypertensive changes; + irregular z line, biopsies showed no IM: EGD 05/2018, Dr. Lyndel Safe while hospitalized, gastritis, mild duodenitis.  No portal hypertensive signs.  Immunized for hep a/b 2015  Ascites managed with relatively low dose aldactone, lasix   MELD score 10 (09/2018 labs) 2. C. Diff colitis:diarrhea following abx for buttocks abscess 2015; C. Diff PCR +, C. Diff toxin A/B positive; treated with flagyl, vanc   This service was provided via virtual visit.   Only audio was used.  The patient was located at home.  I was located in my office.  The patient did consent to this virtual visit and is aware of possible charges through their insurance for this visit.  Her husband was on the phone as well. The patient is an established patient.  My certified medical assistant, Grace Bushy, contributed to this visit by contacting the patient by phone 1 or 2 business days prior to the appointment and also followed up on the recommendations I made after the visit.  Time spent on virtual visit: 26 min   HPI: This is a very pleasant 78 year old woman whom I last saw about 5 months ago  She broke her ankle and also injured her knee.  She is still in a wheelchair.  Her feet are very swollen, especially.  Her ankles are swollen. The swelling is gone in the AM.  Worse than in a long time.  I last saw her about 5 months ago there was quite a lot of confusion about her diuretic  dosing between her and her husband.  I rechecked her labs and then decided to increase her Aldactone 200 mg daily.  She was to maintain her Lasix dose at 40 mg daily.  I wanted her to have a repeat set of labs 2 weeks after that however that never happened, I have not seen labs back from her.  Likewise I hoped that she would return to see me in 2 to 3 months but this is the first time I am seeing her since then, 5 months ago  Her husband read from a med list: Lasix 74m once daily Aldactone 1019monce daily.  She is very careful about maintaining a low-salt diet.  She took 2 Advil a couple weeks ago for her knee inflammation but none since then.  She has had no difficulty breathing.  She nor her husband can detect any obvious ascites.   Chief complaint is decompensated cirrhosis  ROS: complete GI ROS as described in HPI, all other review negative.  Constitutional:  No unintentional weight loss   Past Medical History:  Diagnosis Date  . Allergic rhinitis 01/23/2016  . C. difficile colitis   . CAD (coronary artery disease)    a. s/p STEMI in 01/2015 with 95% LCx stenosis and distal 80% LCx stenosis (DESx2 placed)  . Cancer ([facility]   SKIN  . Candidiasis of skin 09/30/2014  . Cir[place]osis ([facility]  . Depression   . Diabetes mellitus 2008  . Gout   . Herpes simplex   .  Hyperglycemia 05/31/2013  . Hyperlipidemia   . Hypertension   . Myocardial infarction ([place])   . 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 Obesity   . Polycythemia    Dr. Elease Hashimoto- [facility]  . Psoriasis     Past Surgical History:  Procedure Laterality Date  . ANKLE FRACTURE SURGERY    . BIOPSY  05/26/2018   Procedure: BIOPSY;  Surgeon: [name], MD;  Location[facility] ENDOSCOPY;  Service: Endoscopy;;  . BREAST SURGERY Left    milk duct  . CATARACT EXTRACTION, BILATERAL    . COLONOSCOPY    . ESOPHAGOGASTRODUODENOSCOPY N/A 05/26/2018   Procedure: ESOPHAGOGASTRODUODENOSCOPY (EGD);  Surgeon: [name], MD;   [facility] END[facility]COPY;  Service: Endoscopy;  Laterality: N/A;  . IR PARACENTESIS  05/25/2018  . LEFT HEART CATHETERIZATION WITH CORONARY ANGIOGRAM N/A 01/27/2015   Procedure: LEFT HEART CATHETERIZATION WITH CORONARY ANGIOGRAM;  Surgeon: [name], MD;  Location: [facility] CATH LAB;  Service: Cardiovascular;  Laterality: N/A;  . TONSILLECTOMY    . TOTAL ABDOMINAL HYSTERECTOMY W/ BILATERAL SALPINGOOPHORECTOMY     for heavy periods with appendectomy    Current Outpatient Medications  Medication Sig Dispense Refill  . acetaminophen (TYLENOL) 325 MG tablet Take 2 tablets (650 mg total) by mouth every 6 (six) hours as needed for mild pain (or Fever >/= 101).    Marland Kitchen amLODipine (NORVASC) 5 MG tablet Take 1 tablet (5 mg total) by mouth daily. 90 tablet 3  . Blood Glucose Monitoring Suppl (ACCU-CHEK GUIDE) w/Device KIT 1 kit by Does not apply route as directed. 1 kit 0  . clopidogrel (PLAVIX) 75 MG tablet TAKE 1 TABLET(75 MG) BY MOUTH DAILY 90 tablet 3  . diphenoxylate-atropine (LOMOTIL) 2.5-0.025 MG tablet Take 1 tablet by mouth twice daily as needed for diarrhea. 50 tablet 2  . Dulaglutide (TRULICITY) 1.5 HW/2.9HB SOPN Inject 1.5 mg into the skin once a week. 4 pen 1  . fluticasone (FLONASE) 50 MCG/ACT nasal spray Place 1 spray into both nostrils 2 (two) times daily. 16 g 3  . furosemide (LASIX) 40 MG tablet Take 1 tablet (40 mg total) by mouth daily. 90 tablet 3  . glucose blood (ACCU-CHEK GUIDE) test strip Use to check blood sugars 3 times daily 100 each 12  . Insulin Pen Needle (BD PEN NEEDLE NANO U/F) 32G X 4 MM MISC USE AS DIRECTED TO INJECT INSULIN 100 each 0  . insulin regular human CONCENTRATED (HUMULIN R U-500 KWIKPEN) 500 UNIT/ML kwikpen Inject 90 units in am and 80 units before dinner, under skin 6 pen 3  . JAKAFI 25 MG tablet TAKE 1 TABLET (25 MG TOTAL) BY MOUTH 2 (TWO) TIMES DAILY. 60 tablet 6  . levothyroxine (SYNTHROID) 25 MCG tablet Take 1 tablet (25 mcg total) by mouth daily before  breakfast. 30 tablet 0  . loperamide (IMODIUM A-D) 2 MG tablet Take 1-2 tablets (2-4 mg total) by mouth daily as needed for diarrhea or loose stools. 30 tablet 1  . losartan (COZAAR) 100 MG tablet Take 100 mg by mouth daily.  0  . mirtazapine (REMERON) 7.5 MG tablet TAKE 1 TABLET(7.5 MG) BY MOUTH AT BEDTIME 90 tablet 0  . omeprazole (PRILOSEC) 20 MG capsule Take 1 capsule (20 mg total) by mouth daily. 30 capsule 0  . ondansetron (ZOFRAN ODT) 4 MG disintegrating tablet Take 1 tablet (4 mg total) by mouth every 8 (eight) hours as needed. 10 tablet 0  . ONETOUCH VERIO test strip USE TO  TEST BLOOD SUGAR THREE TIMES DAILY 300 each 2  . oxyCODONE-acetaminophen (PERCOCET/ROXICET) 5-325 MG tablet Take 1-2 tablets by mouth every 8 (eight) hours as needed for severe pain. 15 tablet 0  . promethazine (PHENERGAN) 25 MG tablet Take 0.5 tablets (12.5 mg total) by mouth every 6 (six) hours as needed for nausea or vomiting. 30 tablet 1  . spironolactone (ALDACTONE) 50 MG tablet TAKE 2 TABLETS(100 MG) BY MOUTH DAILY 60 tablet 6  . triamcinolone ointment (KENALOG) 0.5 % Apply 1 application topically 2 (two) times daily. 30 g 0  . ezetimibe (ZETIA) 10 MG tablet Take 1 tablet (10 mg total) by mouth daily. 90 tablet 3   No current facility-administered medications for this visit.     Allergies as of 02/28/2019 - Review Complete 02/26/2019  Allergen Reaction Noted  . Penicillins Swelling 05/23/2018  . Lisinopril Cough 04/08/2015  . Tape Hives 01/28/2014  . Doxycycline Hives, Swelling, and Rash   . Latex Hives, Itching, and Rash 09/07/2011    Family History  Problem Relation Age of Onset  . Diabetes Mother   . Heart disease Mother        CAD  . Hyperlipidemia Mother   . Hypertension Mother   . Kidney disease Mother   . Kidney disease Father   . Breast cancer Maternal Aunt   . Heart disease Maternal Grandmother   . Heart disease Other        maternal aunts and uncles  . Colon cancer Neg Hx   .  Esophageal cancer Neg Hx     Social History   Socioeconomic History  . Marital status: Married    Spouse name: Not on file  . Number of children: 2  . Years of education: 2 yr colle  . Highest education level: Not on file  Occupational History  . Occupation: housewife  Social Needs  . Financial resource strain: Not on file  . Food insecurity:    Worry: Not on file    Inability: Not on file  . Transportation needs:    Medical: Not on file    Non-medical: Not on file  Tobacco Use  . Smoking status: Former Smoker    Years: 48.00    Types: Cigarettes    Last attempt to quit: 10/18/1986    Years since quitting: 32.3  . Smokeless tobacco: Never Used  Substance and Sexual Activity  . Alcohol use: No    Alcohol/week: 0.0 standard drinks  . Drug use: No  . Sexual activity: Yes    Partners: Male    Birth control/protection: None  Lifestyle  . Physical activity:    Days per week: Not on file    Minutes per session: Not on file  . Stress: Not on file  Relationships  . Social connections:    Talks on phone: Not on file    Gets together: Not on file    Attends religious service: Not on file    Active member of club or organization: Not on file    Attends meetings of clubs or organizations: Not on file    Relationship status: Not on file  . Intimate partner violence:    Fear of current or ex partner: Not on file    Emotionally abused: Not on file    Physically abused: Not on file    Forced sexual activity: Not on file  Other Topics Concern  . Not on file  Social History Narrative   2 caffeine drink per day.  No regular exercise.     Retired from the bank   2 children (Daughter and a son) son has morbid obesity   2 grandchildren   3 great grandchildren     Physical Exam: Unable to perform because this was a "telemed visit" due to current Covid-19 pandemic  Assessment and plan: 78 y.o. female with decompensated cirrhosis  It sounds like she is getting into trouble  with increasing edema in her lower extremities.  She is on relatively low-dose diuretics and I plan to increase those after reviewing basic metabolic profile which she will get today.  She has had mild renal insufficiency chronically, that obviously complicates diuretic dosing.  She will return for follow-up next week for an in person visit, I think it is very important to examine her personally before any further adjustments to her medicines.  At the time of next visit we will discuss hepatoma screening and other cirrhosis maintenance testing that she may need.  Please see the "Patient Instructions" section for addition details about the plan.  Owens Loffler, MD [facility] 02/28/2019, 9:22 AM

## 2019-03-01 DIAGNOSIS — R262 Difficulty in walking, not elsewhere classified: Secondary | ICD-10-CM | POA: Diagnosis not present

## 2019-03-01 DIAGNOSIS — M6281 Muscle weakness (generalized): Secondary | ICD-10-CM | POA: Diagnosis not present

## 2019-03-01 DIAGNOSIS — M25571 Pain in right ankle and joints of right foot: Secondary | ICD-10-CM | POA: Diagnosis not present

## 2019-03-02 ENCOUNTER — Telehealth: Payer: Self-pay

## 2019-03-02 NOTE — Telephone Encounter (Signed)
Covid-19 travel screening questions  Have you traveled in the last 14 days? No If yes where?   Do you now or have you had a fever in the last 14 days? No  Do you have any respiratory symptoms of shortness of breath or cough now or in the last 14 days? No  Do you have a medical history of Congestive Heart Failure? No  Do you have a medical history of lung disease? No  Do you have any family members or close contacts with diagnosed or suspected Covid-19? No

## 2019-03-05 ENCOUNTER — Ambulatory Visit: Payer: PPO | Admitting: Gastroenterology

## 2019-03-05 ENCOUNTER — Telehealth: Payer: Self-pay

## 2019-03-05 NOTE — Telephone Encounter (Signed)
Covid-19 travel screening questions  Have you traveled in the last 14 days? If yes where? NO   Do you now or have you had a fever in the last 14 days? NO  Do you have any respiratory symptoms of shortness of breath or cough now or in the last 14 days? NO  Do you have a medical history of Congestive Heart Failure? NO  Do you have a medical history of lung disease? NO  Do you have any family members or close contacts with diagnosed or suspected Covid-19? NO

## 2019-03-06 ENCOUNTER — Other Ambulatory Visit (INDEPENDENT_AMBULATORY_CARE_PROVIDER_SITE_OTHER): Payer: PPO

## 2019-03-06 ENCOUNTER — Ambulatory Visit (INDEPENDENT_AMBULATORY_CARE_PROVIDER_SITE_OTHER): Payer: PPO | Admitting: Gastroenterology

## 2019-03-06 ENCOUNTER — Other Ambulatory Visit: Payer: Self-pay

## 2019-03-06 ENCOUNTER — Telehealth: Payer: Self-pay | Admitting: Cardiovascular Disease

## 2019-03-06 ENCOUNTER — Telehealth: Payer: Self-pay | Admitting: Gastroenterology

## 2019-03-06 ENCOUNTER — Encounter: Payer: Self-pay | Admitting: Gastroenterology

## 2019-03-06 VITALS — BP 140/80 | HR 92 | Ht 64.0 in | Wt 233.0 lb

## 2019-03-06 DIAGNOSIS — M25571 Pain in right ankle and joints of right foot: Secondary | ICD-10-CM | POA: Diagnosis not present

## 2019-03-06 DIAGNOSIS — K7460 Unspecified cirrhosis of liver: Secondary | ICD-10-CM

## 2019-03-06 DIAGNOSIS — R262 Difficulty in walking, not elsewhere classified: Secondary | ICD-10-CM | POA: Diagnosis not present

## 2019-03-06 DIAGNOSIS — M6281 Muscle weakness (generalized): Secondary | ICD-10-CM | POA: Diagnosis not present

## 2019-03-06 LAB — BASIC METABOLIC PANEL
BUN: 18 mg/dL (ref 6–23)
CO2: 28 mEq/L (ref 19–32)
Calcium: 9.7 mg/dL (ref 8.4–10.5)
Chloride: 104 mEq/L (ref 96–112)
Creatinine, Ser: 1.28 mg/dL — ABNORMAL HIGH (ref 0.40–1.20)
GFR: 40.17 mL/min — ABNORMAL LOW (ref >60.00)
Glucose, Bld: 103 mg/dL — ABNORMAL HIGH (ref 70–99)
Potassium: 3.4 mEq/L — ABNORMAL LOW (ref 3.5–5.1)
Sodium: 141 mEq/L (ref 135–145)

## 2019-03-06 NOTE — Telephone Encounter (Signed)
Mychart, smartphone, consent, pre reg complete 03/06/19 AF

## 2019-03-06 NOTE — Patient Instructions (Addendum)
Basic metabolic profile today.  I will adjust her diuretics based on that.   Return office visit with me, in person, in 2 to 3 weeks.  03/21/19 10am  Thank you for entrusting me with your care and choosing [facility].  Dr [name]

## 2019-03-06 NOTE — Telephone Encounter (Signed)
Patient husband called has questions regarding medication.

## 2019-03-06 NOTE — Progress Notes (Signed)
Review of pertinent gastrointestinal problems: 1. 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  AFP 08/2014 normal... 01/2018 4.7 (normal)  EGD 02/2014 no portal hypertensive changes; + irregular z line, biopsies showed no IM: EGD 05/2018, Dr. Lyndel Safe while hospitalized, gastritis, mild duodenitis.  No portal hypertensive signs.  Immunized for hep a/b 2015  Ascites managed with relatively low dose aldactone, lasix   MELD score 10 (09/2018 labs) 2. C. Diff colitis:diarrhea following abx for buttocks abscess 2015; C. Diff PCR +, C. Diff toxin A/B positive; treated with flagyl, vanc    HPI: This is a very pleasant 78 year old woman who is here with her husband today.    I visited with her via telemedicine last week.  I adjusted her diuretics, increase her Aldactone from 100 mg daily up to 200 mg daily.  I kept her Lasix the same (70m daily).  This was after reviewing her basic metabolic profile which showed a potassium of 3.2 and a creatinine 1.6.  She has noticed a slight improvement since increasing the Aldactone and her ability to stand.  She thinks the edema in her lower extremities is about the same.  She is breathing comfortably.  Her weighs today is 233 pounds, which is 8 pounds higher than same scale 5-6 months ago.   Chief complaint is edema from cirrhosis  ROS: complete GI ROS as described in HPI, all other review negative.  Constitutional:  No unintentional weight loss   Past Medical History:  Diagnosis Date  . Allergic rhinitis 01/23/2016  . C. difficile colitis   . CAD (coronary artery disease)    a. s/p STEMI  in 01/2015 with 95% LCx stenosis and distal 80% LCx stenosis (DESx2 placed)  . Cancer ([facility]    SKIN  . Candidiasis of skin 09/30/2014  . Cirrhosis ([facility] [place]   . Depression   . Diabetes mellitus 2008  . Gout   . Herpes simplex   . Hyperglycemia 05/31/2013  . Hyperlipidemia   . Hypertension   . Myocardial infarction ([facility]   . Neuromuscular disorder ([facility]    BELL PALSY  . Obesity   . Polycythemia    Dr. WElease Hashimoto [facility]  . Psoriasis     Past Surgical History:  Procedure Laterality Date  . ANKLE FRACTURE SURGERY    . BIOPSY  05/26/2018   Procedure: BIOPSY;  Surgeon: GJackquline Denmark MD;  [facility];  Service: Endoscopy;;  . BREAST SURGERY Left    milk duct  . CATARACT EXTRACTION, BILATERAL    . COLONOSCOPY    . ESOPHAGOGASTRODUODENOSCOPY N/A 05/26/2018   Procedure: ESOPHAGOGASTRODUODENOSCOPY (EGD);  Surgeon: GJackquline Denmark MD;  [facility];  Service: Endoscopy;  Laterality: N/A;  . IR PARACENTESIS  05/25/2018  . LEFT HEART CATHETERIZATION WITH CORONARY ANGIOGRAM N/A 01/27/2015   Procedure: LEFT HEART CATHETERIZATION WITH CORONARY ANGIOGRAM;  Surgeon: TTroy Sine MD;  [facility];  Service: Cardiovascular;  Laterality: N/A;  . TONSILLECTOMY    . TOTAL ABDOMINAL HYSTERECTOMY W/ BILATERAL SALPINGOOPHORECTOMY     for heavy periods with appendectomy    Current Outpatient Medications  Medication Sig Dispense Refill  . acetaminophen (TYLENOL) 325 MG tablet Take 2 tablets (650 mg  total) by mouth every 6 (six) hours as needed for mild pain (or Fever >/= 101).    Marland Kitchen amLODipine (NORVASC) 5 MG tablet Take 1 tablet (5 mg total) by mouth daily. 90 tablet 3  . Blood Glucose Monitoring Suppl (ACCU-CHEK GUIDE) w/Device KIT 1 kit by Does not apply route as directed. 1 kit 0  . clopidogrel (PLAVIX) 75 MG tablet TAKE 1 TABLET(75 MG) BY MOUTH DAILY 90 tablet 3  . diphenoxylate-atropine (LOMOTIL) 2.5-0.025 MG tablet Take 1 tablet by mouth twice daily as needed for  diarrhea. 50 tablet 2  . Dulaglutide (TRULICITY) 1.5 KD/9.8PJ SOPN Inject 1.5 mg into the skin once a week. 4 pen 1  . fluticasone (FLONASE) 50 MCG/ACT nasal spray Place 1 spray into both nostrils 2 (two) times daily. 16 g 3  . furosemide (LASIX) 40 MG tablet Take 1 tablet (40 mg total) by mouth daily. 90 tablet 3  . glucose blood (ACCU-CHEK GUIDE) test strip Use to check blood sugars 3 times daily 100 each 12  . Insulin Pen Needle (BD PEN NEEDLE NANO U/F) 32G X 4 MM MISC USE AS DIRECTED TO INJECT INSULIN 100 each 0  . insulin regular human CONCENTRATED (HUMULIN R U-500 KWIKPEN) 500 UNIT/ML kwikpen Inject 90 units in am and 80 units before dinner, under skin 6 pen 3  . JAKAFI 25 MG tablet TAKE 1 TABLET (25 MG TOTAL) BY MOUTH 2 (TWO) TIMES DAILY. 60 tablet 6  . levothyroxine (SYNTHROID) 25 MCG tablet Take 1 tablet (25 mcg total) by mouth daily before breakfast. 30 tablet 0  . loperamide (IMODIUM A-D) 2 MG tablet Take 1-2 tablets (2-4 mg total) by mouth daily as needed for diarrhea or loose stools. 30 tablet 1  . losartan (COZAAR) 100 MG tablet Take 100 mg by mouth daily.  0  . mirtazapine (REMERON) 7.5 MG tablet TAKE 1 TABLET(7.5 MG) BY MOUTH AT BEDTIME 90 tablet 0  . omeprazole (PRILOSEC) 20 MG capsule Take 1 capsule (20 mg total) by mouth daily. 30 capsule 0  . ondansetron (ZOFRAN ODT) 4 MG disintegrating tablet Take 1 tablet (4 mg total) by mouth every 8 (eight) hours as needed. 10 tablet 0  . ONETOUCH VERIO test strip USE TO TEST BLOOD SUGAR THREE TIMES DAILY 300 each 2  . oxyCODONE-acetaminophen (PERCOCET/ROXICET) 5-325 MG tablet Take 1-2 tablets by mouth every 8 (eight) hours as needed for severe pain. 15 tablet 0  . promethazine (PHENERGAN) 25 MG tablet Take 0.5 tablets (12.5 mg total) by mouth every 6 (six) hours as needed for nausea or vomiting. 30 tablet 1  . spironolactone (ALDACTONE) 100 MG tablet Take 2 tablets daily. 60 tablet 6  . triamcinolone ointment (KENALOG) 0.5 % Apply 1  application topically 2 (two) times daily. 30 g 0  . ezetimibe (ZETIA) 10 MG tablet Take 1 tablet (10 mg total) by mouth daily. 90 tablet 3   No current facility-administered medications for this visit.     Allergies as of 03/06/2019 - Review Complete 03/06/2019  Allergen Reaction Noted  . Penicillins Swelling 05/23/2018  . Lisinopril Cough 04/08/2015  . Tape Hives 01/28/2014  . Doxycycline Hives, Swelling, and Rash   . Latex Hives, Itching, and Rash 09/07/2011    Family History  Problem Relation Age of Onset  . Diabetes Mother   . Heart disease Mother        CAD  . Hyperlipidemia Mother   . Hypertension Mother   . Kidney disease Mother   . Kidney  disease Father   . Breast cancer Maternal Aunt   . Heart disease Maternal Grandmother   . Heart disease Other        maternal aunts and uncles  . Colon cancer Neg Hx   . Esophageal cancer Neg Hx     Social History   Socioeconomic History  . Marital status: Married    Spouse name: Not on file  . Number of children: 2  . Years of education: 2 yr colle  . Highest education level: Not on file  Occupational History  . Occupation: housewife  Social Needs  . Financial resource strain: Not on file  . Food insecurity:    Worry: Not on file    Inability: Not on file  . Transportation needs:    Medical: Not on file    Non-medical: Not on file  Tobacco Use  . Smoking status: Former Smoker    Years: 48.00    Types: Cigarettes    Last attempt to quit: 10/18/1986    Years since quitting: 32.4  . Smokeless tobacco: Never Used  Substance and Sexual Activity  . Alcohol use: No    Alcohol/week: 0.0 standard drinks  . Drug use: No  . Sexual activity: Yes    Partners: Male    Birth control/protection: None  Lifestyle  . Physical activity:    Days per week: Not on file    Minutes per session: Not on file  . Stress: Not on file  Relationships  . Social connections:    Talks on phone: Not on file    Gets together: Not on file     Attends religious service: Not on file    Active member of club or organization: Not on file    Attends meetings of clubs or organizations: Not on file    Relationship status: Not on file  . Intimate partner violence:    Fear of current or ex partner: Not on file    Emotionally abused: Not on file    Physically abused: Not on file    Forced sexual activity: Not on file  Other Topics Concern  . Not on file  Social History Narrative   2 caffeine drink per day.  No regular exercise.     Retired from the bank   2 children (Daughter and a son) son has morbid obesity   2 grandchildren   3 great grandchildren     Physical Exam: BP 140/80   Pulse 92   Ht 5' 4"  (1.626 m)   Wt 233 lb (105.7 kg)   BMI 39.99 kg/m  Constitutional: Chronically ill-appearing, obese and sits in a wheelchair Psychiatric: alert and oriented x3 Abdomen: soft, nontender, nondistended, no obvious ascites, no peritoneal signs, normal bowel sounds 2+ pitting edema bilaterally in her ankles Lungs are clear to auscultation bilaterally  Assessment and plan: 78 y.o. female with decompensated cirrhosis  She is still bothered by lower extremity edema.  No obvious pleural effusions on examination and no ascites on examination.  She will get a repeat basic metabolic profile today and if possible I will increase her diuretics a bit further, likely Lasix up to 60 mg daily.  She knows to avoid excessive salt in her diet, limit her fluid intake, she does not take NSAIDs.  She will return to see me in 2 or 3 weeks in person in the office and knows to call sooner if there are any troubles.  Please see the "Patient Instructions" section for addition details  about the plan.  Owens Loffler, MD [facility] 03/06/2019, 11:31 AM

## 2019-03-07 ENCOUNTER — Ambulatory Visit: Payer: PPO | Admitting: Gastroenterology

## 2019-03-07 ENCOUNTER — Other Ambulatory Visit: Payer: Self-pay | Admitting: Gastroenterology

## 2019-03-07 DIAGNOSIS — K7460 Unspecified cirrhosis of liver: Secondary | ICD-10-CM

## 2019-03-07 MED ORDER — POTASSIUM CHLORIDE ER 10 MEQ PO TBCR: 10.0000 meq | EXTENDED_RELEASE_TABLET | Freq: Every day | ORAL | 6 refills | Status: DC

## 2019-03-07 MED ORDER — FUROSEMIDE 20 MG PO TABS: ORAL_TABLET | ORAL | 6 refills | Status: DC

## 2019-03-07 NOTE — Telephone Encounter (Signed)
Spoke to patients husband. Informed him that I will contact him with the next plan of care once Dr [name] reviews labs.

## 2019-03-08 ENCOUNTER — Encounter: Payer: Self-pay | Admitting: Cardiovascular Disease

## 2019-03-08 ENCOUNTER — Telehealth (INDEPENDENT_AMBULATORY_CARE_PROVIDER_SITE_OTHER): Payer: PPO | Admitting: Cardiovascular Disease

## 2019-03-08 VITALS — BP 149/70 | HR 76 | Ht 65.5 in | Wt 233.0 lb

## 2019-03-08 DIAGNOSIS — I10 Essential (primary) hypertension: Secondary | ICD-10-CM | POA: Diagnosis not present

## 2019-03-08 DIAGNOSIS — D45 Polycythemia vera: Secondary | ICD-10-CM | POA: Diagnosis not present

## 2019-03-08 DIAGNOSIS — E785 Hyperlipidemia, unspecified: Secondary | ICD-10-CM | POA: Diagnosis not present

## 2019-03-08 DIAGNOSIS — R600 Localized edema: Secondary | ICD-10-CM | POA: Diagnosis not present

## 2019-03-08 DIAGNOSIS — I2510 Atherosclerotic heart disease of native coronary artery without angina pectoris: Secondary | ICD-10-CM

## 2019-03-08 DIAGNOSIS — M6281 Muscle weakness (generalized): Secondary | ICD-10-CM | POA: Diagnosis not present

## 2019-03-08 DIAGNOSIS — I5032 Chronic diastolic (congestive) heart failure: Secondary | ICD-10-CM

## 2019-03-08 DIAGNOSIS — E118 Type 2 diabetes mellitus with unspecified complications: Secondary | ICD-10-CM | POA: Diagnosis not present

## 2019-03-08 DIAGNOSIS — Z794 Long term (current) use of insulin: Secondary | ICD-10-CM

## 2019-03-08 DIAGNOSIS — M25571 Pain in right ankle and joints of right foot: Secondary | ICD-10-CM | POA: Diagnosis not present

## 2019-03-08 DIAGNOSIS — R262 Difficulty in walking, not elsewhere classified: Secondary | ICD-10-CM | POA: Diagnosis not present

## 2019-03-08 NOTE — Patient Instructions (Signed)
Medication Instructions:  The current medical regimen is effective;  continue present plan and medications.  If you need a refill on your cardiac medications before your next appointment, please call your pharmacy.   Follow-Up: At [facility], you and your health needs are our priority.  As part of our continuing mission to provide you with exceptional heart care, we have created designated Provider Care Teams.  These Care Teams include your primary Cardiologist (physician) and Advanced Practice Providers (APPs -  Physician Assistants and Nurse Practitioners) who all work together to provide you with the care you need, when you need it. You will need a follow up appointment in 6 months.  Please call our office 2 months in advance to schedule this appointment.  You may see Dr.Kelly or one of the following Advanced Practice Providers on your designated Care Team: Hao Meng, PA-C . Angela Duke, PA-C     

## 2019-03-08 NOTE — Progress Notes (Signed)
Virtual Visit via Video Note   This visit type was conducted due to national recommendations for restrictions regarding the COVID-19 Pandemic (e.g. social distancing) in an effort to limit this patient's exposure and mitigate transmission in our community.  Due to her co-morbid illnesses, this patient is at least at moderate risk for complications without adequate follow up.  This format is felt to be most appropriate for this patient at this time.  All issues noted in this document were discussed and addressed.  A limited physical exam was performed with this format.  Please refer to the patient's chart for her consent to telehealth for [facility].   Date:  03/08/2019   ID:  Amanda [name], DOB 1941/07/17, MRN [id number]  Patient Location: Home Provider Location: Home  PCP:  [name], NP  Cardiologist:  Shelva Majestic, MD Electrophysiologist:  None   Evaluation Performed:  Follow-Up Visit  Chief Complaint:  6 month F/U  History of Present Illness:    Amanda [name] is a 78 y.o. female who has a history of hypertension, type 2 diabetes mellitus, polycythemia vera, as well as peripheral neuropathy.  She developed new onset chest pain on 01/27/2015 and was found have 3 mm ST segment elevation by EMS.  During transport to the hospital she developed idioventricular rhythm which was treated with amiodarone bolus.  I took her acutely to the cardiac catheterization laboratory on 01/27/2015.  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  She underwent successful PCI intervention with  insertion of a  Xience Alpine 3.023 mm DES stent postdilated to 3.15 millimeters in the mid lesion and the distal stenosis was treated with a 2.518 mm Xience Alpine DES stent postdilated to 2.57 mm.  It was felt that her  idioventricular rhythm was probably the result of spontaneous reperfusion in route to the hospital.  She initially was discharged after 3 days to [facility] rehabilitation which did she not find to be helpful and went home 3 days later with home health care coming to her house.  During her hospitalization, an echo Doppler study revealed an EF of 50-55%.  She had abnormal tissue Doppler.  There is mitral annular calcification and moderate LA dilatation.  She had abnormal posterior lateral and septal strain.  When I saw her she was having difficulty with bloating and diarrhea.  She denied any recurrent episodes of chest pain.  She admits to exertional shortness of breath.  She is not very active and does not ambulate well.   After one year of full dose dual antiplatelet therapy, she was started on reduced dose of Brilinta at 60 mg twice a day per Pegasys trial data.  She was hospitalized from April 12 through 01/30/2016 a several week history of shortness of breath.  Her hospitalization she was seen by cardiology and was not felt to be due to CHF.  She presented with markedly elevated glucose with metabolic acidosis but without an anion gap.  She has continued to be on Jakafi for  polycythemia vera.  She was found to have a 7 x 6 mm nodule opacify in the superior segment of the right lower lobe on CT and a hollow of noncontrast chest CT at 6-12 months was recommended.  She has continued to have fatigue.  She  saw Dr. [name] of her in follow-up evaluation and he was concerned that additional evaluation of her myeloproliferative disorder and according to the patient.   With reference to her fatigue, she is extremely tired.  At times she may go to bed at 6:30 at night.  She does snore.  Her sleep is nonrestorative.  She admits to daytime sleepiness.  She has never had an evaluation for sleep apnea and when I last saw her, I strongly recommended a sleep study for further evaluation  She was hospitalized  [place] [facility] .  During the summer.  Multiple family members were coughing week prior to the hospitalization.  Reportedly, she spent 8 days in the hospital and developed kidney insufficiency.  Reportedly, her creatinine had increased to 4 before trending down.  Venous Dopplers were negative for DVT.  A right renal mass was seen, which she has undergone subsequent evaluation.  She saw Dr. Inda Castle in August 2018.  An MRI of her kidney was negative for mass.  She also has seen Dr. [name] for her polycythemia vera.  When I  saw her in October 2018 she was without chest pain.  She had not been successful with weight loss.  She had seen a liver specialist and was told of possibly having hepatic steatosis rather than cirrhosis.   Over the past year, apparently she was hospitalized in June 2019 and felt to have a mini stroke.  She had presented with intermittent lightheadedness and dizziness and had weakness in her legs.  Infarcts were noted on MRI but felt most likely not responsible for her presenting lower extremity weakness.  Her bilateral carotid Dopplers did not reveal high-grade abnormality.  A 2D echo Doppler study showed an EF of 60 to 65% with grade 1 diastolic dysfunction.  A bubble study was inadequate.   I last saw her in December 2019 after not having seen her since October 2018.  During my evaluation her blood pressure was elevated  on amlodipine 10 mg, spironolactone 100 mg, losartan 100 mg and furosemide 20 mg.  She has significant 2+ bilateral edema.  This may be contributed by her amlodipine and I have recommended reduction of her amlodipine dose down to 5 mg.  I have recommended further titration of Lasix to 40 mg daily.  I have also recommended support stockings with at least 15 to 20 mm pressure support. She was on simvastatin for hyperlipidemia.  I recommended the addition of Zetia 10 mg in attempt to achieve an LDL goal less than 70 and also  discussed with her data regarding Repatha. She had continued to be on Plavix.  She is diabetic on dulaglutide in addition to insulin and has continued to see Dr. [name] for her PCV and remained on Jakafi.  She has subsequently seen Dr. [name] regarding cirrhosis of the liver presumably NASH who has been adjusting her diuretic to reduce swelling and had a telemedicine visit this week Lasix was increased to 60 mg daily and she has continued to be on spironolactone 100 mg twice daily.  At present, she denies any recurrent chest pain PND orthopnea.  She continues to experience leg swelling.  Since I last saw her she had broken her right ankle and was in a cast.  She also has noted some right knee discomfort.  She has been undergoing physical therapy 2 days/week and has been trying to walk.  She denies any palpitations presyncope or syncope.  The patient does  not have symptoms concerning for COVID-19 infection (fever, chills, cough, or new shortness of breath).    Past Medical History:  Diagnosis Date   Allergic rhinitis 01/23/2016   C. difficile colitis    CAD (coronary artery disease)    a. s/p STEMI in 01/2015 with 95% LCx stenosis and distal 80% LCx stenosis (DESx2 placed)   Cancer ([place])    SKIN   Candidiasis of skin 09/30/2014   Cirrhosis ([place])    Depression    Diabetes mellitus 2008   Gout    Herpes simplex    Hyperglycemia 05/31/2013   Hyperlipidemia    Hypertension    Myocardial infarction ([place])    Neuromuscular disorder (H[facility])    BELL PALSY   Obesity    Polycythemia    Dr. Elease Hashimoto- [facility]   Psoriasis    Past Surgical History:  Procedure Laterality Date   ANKLE FRACTURE SURGERY     BIOPSY  05/26/2018   Procedure: BIOPSY;  Surgeon: [name], MD;  Location: [place];  Service: Endoscopy;;   BREAST SURGERY Left    milk duct   CATARACT EXTRACTION, BILATERAL     COLONOSCOPY     ESOPHAGOGASTRODUODENOSCOPY N/A 05/26/2018   Procedure:  ESOPHAGOGASTRODUODENOSCOPY (EGD);  Surgeon: [name], MD;  Location: [facility] ENDOSCOPY;  Service: Endoscopy;  Laterality: N/A;   IR PARACENTESIS  05/25/2018   LEFT HEART CATHETERIZATION WITH CORONARY ANGIOGRAM N/A 01/27/2015   Procedure: LEFT HEART CATHETERIZATION WITH CORONARY ANGIOGRAM;  Surgeon: [name], MD;  Location: [facility] CATH LAB;  Service: Cardiovascular;  Laterality: N/A;   TONSILLECTOMY     TOTAL ABDOMINAL HYSTERECTOMY W/ BILATERAL SALPINGOOPHORECTOMY     for heavy periods with appendectomy     Current Meds  Medication Sig   acetaminophen (TYLENOL) 325 MG tablet Take 2 tablets (650 mg total) by mouth every 6 (six) hours as needed for mild pain (or Fever >/= 101).   amLODipine (NORVASC) 5 MG tablet Take 1 tablet (5 mg total) by mouth daily.   Blood Glucose Monitoring Suppl (ACCU-CHEK GUIDE) w/Device KIT 1 kit by Does not apply route as directed.   clopidogrel (PLAVIX) 75 MG tablet TAKE 1 TABLET(75 MG) BY MOUTH DAILY   diphenoxylate-atropine (LOMOTIL) 2.5-0.025 MG tablet Take 1 tablet by mouth twice daily as needed for diarrhea.   Dulaglutide (TRULICITY) 1.5 PP/2.9JJ SOPN Inject 1.5 mg into the skin once a week.   ezetimibe (ZETIA) 10 MG tablet Take 1 tablet (10 mg total) by mouth daily.   fluticasone (FLONASE) 50 MCG/ACT nasal spray Place 1 spray into both nostrils 2 (two) times daily.   furosemide (LASIX) 20 MG tablet Take (3)  20 mg tabs to equal 60 mg daily   glucose blood (ACCU-CHEK GUIDE) test strip Use to check blood sugars 3 times daily   Insulin Pen Needle (BD PEN NEEDLE NANO U/F) 32G X 4 MM MISC USE AS DIRECTED TO INJECT INSULIN   insulin regular human CONCENTRATED (HUMULIN R U-500 KWIKPEN) 500 UNIT/ML kwikpen Inject 90 units in am and 80 units before dinner, under skin   JAKAFI 25 MG tablet TAKE 1 TABLET (25 MG TOTAL) BY MOUTH 2 (TWO) TIMES DAILY.   levothyroxine (SYNTHROID) 25 MCG tablet Take 1 tablet (25 mcg total) by mouth daily before breakfast.    loperamide (IMODIUM A-D) 2 MG tablet Take 1-2 tablets (2-4 mg total) by mouth daily as needed for diarrhea or loose stools.   losartan (COZAAR) 100 MG tablet Take 100 mg by mouth daily.  mirtazapine (REMERON) 7.5 MG tablet TAKE 1 TABLET(7.5 MG) BY MOUTH AT BEDTIME   omeprazole (PRILOSEC) 20 MG capsule Take 1 capsule (20 mg total) by mouth daily.   ondansetron (ZOFRAN ODT) 4 MG disintegrating tablet Take 1 tablet (4 mg total) by mouth every 8 (eight) hours as needed.   ONETOUCH VERIO test strip USE TO TEST BLOOD SUGAR THREE TIMES DAILY   oxyCODONE-acetaminophen (PERCOCET/ROXICET) 5-325 MG tablet Take 1-2 tablets by mouth every 8 (eight) hours as needed for severe pain.   potassium chloride (KLOR-CON 10) 10 MEQ tablet Take 1 tablet (10 mEq total) by mouth daily.   promethazine (PHENERGAN) 25 MG tablet Take 0.5 tablets (12.5 mg total) by mouth every 6 (six) hours as needed for nausea or vomiting.   spironolactone (ALDACTONE) 100 MG tablet Take 2 tablets daily.   triamcinolone ointment (KENALOG) 0.5 % Apply 1 application topically 2 (two) times daily.     Allergies:   Penicillins; Lisinopril; Tape; Doxycycline; and Latex   Social History   Tobacco Use   Smoking status: Former Smoker    Years: 48.00    Types: Cigarettes    Last attempt to quit: 10/18/1986    Years since quitting: 32.4   Smokeless tobacco: Never Used  Substance Use Topics   Alcohol use: No    Alcohol/week: 0.0 standard drinks   Drug use: No     Family Hx: The patient's family history includes Breast cancer in her maternal aunt; Diabetes in her mother; Heart disease in her maternal grandmother, mother, and another family member; Hyperlipidemia in her mother; Hypertension in her mother; Kidney disease in her father and mother. There is no history of Colon cancer or Esophageal cancer.  ROS:   Please see the history of present illness.    No fever chills night sweats, change in taste or smell No significant  weight loss No wheezing or chest congestion No palpitations, chest pain PND orthopnea. Lower extremity edema Occasional abdominal bloating Recent right ankle fracture and right knee discomfort Positive for polycythemia vera and anemia followed by Dr. [name] History of possible mini stroke continues to be on Plavix Positive for snoring but admits to sleeping like a log.  All other systems reviewed and are negative.   Prior CV studies:   The following studies were reviewed today:  ------------------------------------------------------------------- ECHO Study Conclusions: 03/24/2018  - Left ventricle: The cavity size was normal. Wall thickness was   increased in a pattern of moderate LVH. Systolic function was   normal. The estimated ejection fraction was in the range of 60%   to 65%. Wall motion was normal; there were no regional wall   motion abnormalities. Doppler parameters are consistent with   abnormal left ventricular relaxation (grade 1 diastolic   dysfunction). Doppler parameters are consistent with high   ventricular filling pressure. - Mitral valve: Calcified annulus. There was mild regurgitation. - Left atrium: The atrium was moderately dilated.  Impressions:  - Bubble study attempted twice but unable to visualize.   Labs/Other Tests and Data Reviewed:    EKG:  An ECG dated 09/19/2018 was personally reviewed today and demonstrated:  Normal sinus rhythm with an isolated PVC.  QTc interval 468 ms  Recent Labs: 03/23/2018: B Natriuretic Peptide 675.6 11/16/2018: TSH 4.66 01/24/2019: ALT 15; Hemoglobin 12.6; Platelet Count 298 03/06/2019: BUN 18; Creatinine, Ser 1.28; Potassium 3.4; Sodium 141   Recent Lipid Panel Lab Results  Component Value Date/Time   CHOL 167 03/23/2018 02:21 AM   TRIG 247 (  H) 03/23/2018 02:21 AM   HDL 25 (L) 03/23/2018 02:21 AM   CHOLHDL 6.7 03/23/2018 02:21 AM   LDLCALC 93 03/23/2018 02:21 AM   LDLDIRECT 103.0 02/14/2017 11:22 AM    Wt  Readings from Last 3 Encounters:  03/08/19 233 lb (105.7 kg)  03/06/19 233 lb (105.7 kg)  02/28/19 229 lb (103.9 kg)     Objective:    Vital Signs:  BP (!) 149/70    Pulse 76    Ht 5' 5.5" (1.664 m)    Wt 233 lb (105.7 kg)    BMI 38.18 kg/m    Vital signs reviewed, blood pressure elevated.  According to husband blood pressure typically runs up and down. She is well-developed well-nourished with moderate obesity in no acute distress Breathing is normal and unlabored HEENT is unremarkable. Thick neck No audible wheezing No awareness of abnormal heart rhythm, rhythm regular No chest wall tenderness to palpation No current abdominal tenderness Continues to have bilateral leg swelling Status post recent right ankle fracture No new neurologic symptoms Normal cognition mood and affect    ASSESSMENT & PLAN:    1. CAD: Status post left circumflex STEMI January 27, 2015 with successful intervention.  Had transient idioventricular rhythm most likely as result of spontaneous reperfusion.  Significant myocardial salvage with normal motion and EF at 50 to 55%. Grade 1 diastolic dysfunction. 2. Essential hypertension: BP elevated today.  Her dose of Lasix was just increased yesterday to 60 mg.  She continues to be on losartan 100 mg daily, spironolactone 100 mg twice a day.  I discussed stage I hypertension beginning at 130/80 in stage II hypertension 140/90.  She will monitor her blood pressure.  We discussed sodium restriction, weight loss and increased activity as tolerated. 3. Hyperlipidemia: She tells me she had follow-up laboratory by Sallee Provencal recently.  When I last saw her Zetia 10 mg was added to her rosuvastatin 20 mg.  Target LDL is less than 70. 4. [name]: Followed by Dr. [name], now on [place]. 5. 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  She has been followed by Dr. [name].  Diuretic regimen was recently increased. 6. History of mini stroke: Continues to be on long-term  Plavix. 7. Morbid obesity: BMI elevated in this diabetic female with cardiovascular comorbidities 8. Diabetes mellitus, currently on Trulicity and metformin.  Laboratory rechecked by her primary provider  COVID-19 Education: The signs and symptoms of COVID-19 were discussed with the patient and how to seek care for testing (follow up with PCP or arrange E-visit).  The importance of social distancing was discussed today.  Time:   Today, I have spent 25 minutes with the patient with telehealth technology discussing the above problems.     Medication Adjustments/Labs and Tests Ordered: Current medicines are reviewed at length with the patient today.  Concerns regarding medicines are outlined above.   Tests Ordered: No orders of the defined types were placed in this encounter.   Medication Changes: No orders of the defined types were placed in this encounter.   Disposition:  Follow up 6 months  Signed, Shelva Majestic, MD  03/08/2019 3:56 PM    [place]

## 2019-03-10 DIAGNOSIS — S8291XA Unspecified fracture of right lower leg, initial encounter for closed fracture: Secondary | ICD-10-CM | POA: Diagnosis not present

## 2019-03-10 DIAGNOSIS — R531 Weakness: Secondary | ICD-10-CM | POA: Diagnosis not present

## 2019-03-16 ENCOUNTER — Other Ambulatory Visit (INDEPENDENT_AMBULATORY_CARE_PROVIDER_SITE_OTHER): Payer: PPO

## 2019-03-16 DIAGNOSIS — K7460 Unspecified cirrhosis of liver: Secondary | ICD-10-CM

## 2019-03-16 LAB — BASIC METABOLIC PANEL
BUN: 23 mg/dL (ref 6–23)
CO2: 23 mEq/L (ref 19–32)
Calcium: 9.4 mg/dL (ref 8.4–10.5)
Chloride: 105 mEq/L (ref 96–112)
Creatinine, Ser: 1.38 mg/dL — ABNORMAL HIGH (ref 0.40–1.20)
GFR: 36.83 mL/min — ABNORMAL LOW (ref >60.00)
Glucose, Bld: 148 mg/dL — ABNORMAL HIGH (ref 70–99)
Potassium: 3.7 mEq/L (ref 3.5–5.1)
Sodium: 139 mEq/L (ref 135–145)

## 2019-03-20 ENCOUNTER — Encounter: Payer: Self-pay | Admitting: General Surgery

## 2019-03-20 DIAGNOSIS — R262 Difficulty in walking, not elsewhere classified: Secondary | ICD-10-CM | POA: Diagnosis not present

## 2019-03-20 DIAGNOSIS — M6281 Muscle weakness (generalized): Secondary | ICD-10-CM | POA: Diagnosis not present

## 2019-03-20 DIAGNOSIS — M25571 Pain in right ankle and joints of right foot: Secondary | ICD-10-CM | POA: Diagnosis not present

## 2019-03-20 NOTE — Progress Notes (Signed)
Covid-19 screening questions  Have you traveled in the last 14 days? If yes where?no  Do you now or have you had a fever in the last 14 days? no  Do you have any respiratory symptoms of shortness of breath or cough now or in the last 14 days? no  Do you have any family members or close contacts with diagnosed or suspected Covid-19 in the past 14 days? NO  Have you been tested for Covid-19 and found to be positive? NO  The patient was instructed to wear a mask and to come to her appointment alone. The patient is in a wheelchair and requires her husband to bring her in. Instructed her for him to wear a mask as well. Patient verbalized understanding

## 2019-03-21 ENCOUNTER — Other Ambulatory Visit: Payer: Self-pay

## 2019-03-21 ENCOUNTER — Ambulatory Visit (INDEPENDENT_AMBULATORY_CARE_PROVIDER_SITE_OTHER): Payer: PPO | Admitting: Gastroenterology

## 2019-03-21 ENCOUNTER — Encounter: Payer: Self-pay | Admitting: Gastroenterology

## 2019-03-21 VITALS — BP 150/80 | HR 85 | Temp 98.3°F | Ht 64.5 in | Wt 222.0 lb

## 2019-03-21 DIAGNOSIS — K7460 Unspecified cirrhosis of liver: Secondary | ICD-10-CM

## 2019-03-21 NOTE — Progress Notes (Signed)
Review of pertinent gastrointestinal problems: 1. 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  AFP 08/2014 normal... 01/2018 4.7 (normal)  EGD 02/2014 no portal hypertensive changes; + irregular z line, biopsies showed no IM:EGD 05/2018, Dr. Lyndel Safe while hospitalized, gastritis, mild duodenitis. No portal hypertensive signs.  Immunized for hep a/b 2015  Ascites managedwith relatively low dose aldactone, lasix   MELD score 10 (09/2018 labs) 2. C. Diff colitis:diarrhea following abx for buttocks abscess 2015; C. Diff PCR +, C. Diff toxin A/B positive; treated with flagyl, vanc   HPI: This is a very pleasant 78 year old woman whom I last saw here in person 3 to 4 weeks ago.  She is here with her husband again today.  She feels overall much better.  She says she is breathing comfortably, her clothes are fitting better, her swelling is improving in her legs and ankles.  She is able to get around better but still is mainly in a wheelchair.  At the time of her last visit here in the office, about 3 weeks ago, I checked some blood work and based on that I increased her Lasix up to 60 mg daily.  She was going to continue taking the Spironolactone at 200 mg daily.  I also started her on a potassium supplement 10 mEq daily.  Repeat basic metabolic profile about 1 week after those adjustments showed that her creatinine was up a bit however still acceptable at 1.38.  Potassium was 3.7.  Her weight is down 11 pounds since her last visit here about 3 weeks ago.  Same scale in our office  Chief complaint is decompensated cirrhosis  ROS:  complete GI ROS as described in HPI, all other review negative.  Constitutional:  No unintentional weight loss   Past Medical History:  Diagnosis Date  . Allergic rhinitis 01/23/2016  . C. difficile colitis   . CAD (coronary artery disease)    a. s/p STEMI in 01/2015 with 95% LCx stenosis and distal 80% LCx stenosis (DESx2 placed)  . Cancer ([place])    SKIN  . Candidiasis of skin 09/30/2014  . Cirrhosis ([place])   . Depression   . Diabetes mellitus 2008  . Gout   . Herpes simplex   . Hyperglycemia 05/31/2013  . Hyperlipidemia   . Hypertension   . Myocardial infarction ([place])   . 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 Obesity   . Polycythemia    Dr. Elease Hashimoto- [facility]  . Psoriasis     Past Surgical History:  Procedure Laterality Date  . ANKLE FRACTURE SURGERY    . BIOPSY  05/26/2018   Procedure: BIOPSY;  Surgeon: [name], MD;  Location: [facility] ENDOSCOPY;  Service: Endoscopy;;  . BREAST SURGERY Left    milk duct  . CATARACT EXTRACTION, BILATERAL    . COLONOSCOPY    . ESOPHAGOGASTRODUODENOSCOPY N/A 05/26/2018   Procedure: ESOPHAGOGASTRODUODENOSCOPY (EGD);  Surgeon: [name], MD;  Location: [facility] ENDOSCOPY;  Service: Endoscopy;  Laterality: N/A;  . IR PARACENTESIS  05/25/2018  . LEFT HEART CATHETERIZATION WITH CORONARY ANGIOGRAM N/A 01/27/2015   Procedure: LEFT HEART CATHETERIZATION WITH CORONARY ANGIOGRAM;  Surgeon: [name], MD;  Location: [facility] CATH LAB;  Service: Cardiovascular;  Laterality: N/A;  . TONSILLECTOMY    . TOTAL ABDOMINAL HYSTERECTOMY W/ BILATERAL SALPINGOOPHORECTOMY     for heavy periods with appendectomy    Current Outpatient Medications  Medication Sig Dispense Refill  . acetaminophen (TYLENOL) 325 MG tablet Take 2 tablets (650 mg total) by mouth every 6 (six) hours as needed for mild pain (or Fever >/= 101).    Marland Kitchen amLODipine (NORVASC) 5 MG tablet Take 1 tablet (5 mg total) by mouth daily. 90 tablet 3  . Blood Glucose Monitoring Suppl (ACCU-CHEK  GUIDE) w/Device KIT 1 kit by Does not apply route as directed. 1 kit 0  . clopidogrel (PLAVIX) 75 MG tablet TAKE 1 TABLET(75 MG) BY MOUTH DAILY 90 tablet 3  . diphenoxylate-atropine (LOMOTIL) 2.5-0.025 MG tablet Take 1 tablet by mouth twice daily as needed for diarrhea. 50 tablet 2  . Dulaglutide (TRULICITY) 1.5 XT/0.2IO SOPN Inject 1.5 mg into the skin once a week. 4 pen 1  . fluticasone (FLONASE) 50 MCG/ACT nasal spray Place 1 spray into both nostrils 2 (two) times daily. 16 g 3  . furosemide (LASIX) 20 MG tablet Take (3)  20 mg tabs to equal 60 mg daily 90 tablet 6  . glucose blood (ACCU-CHEK GUIDE) test strip Use to check blood sugars 3 times daily 100 each 12  . Insulin Pen Needle (BD PEN NEEDLE NANO U/F) 32G X 4 MM MISC USE AS DIRECTED TO INJECT INSULIN 100 each 0  . insulin regular human CONCENTRATED (HUMULIN R U-500 KWIKPEN) 500 UNIT/ML kwikpen Inject 90 units in am and 80 units before dinner, under skin 6 pen 3  . JAKAFI 25 MG tablet TAKE 1 TABLET (25 MG TOTAL) BY MOUTH 2 (TWO) TIMES DAILY. 60 tablet 6  . levothyroxine (SYNTHROID) 25 MCG tablet Take 1 tablet (25 mcg total) by mouth daily before breakfast. 30 tablet 0  . loperamide (IMODIUM A-D) 2 MG tablet Take 1-2 tablets (2-4 mg total) by mouth daily as needed for diarrhea or loose stools. 30 tablet 1  . losartan (COZAAR) 100 MG tablet Take 100 mg by mouth daily.  0  . mirtazapine (REMERON) 7.5 MG tablet TAKE 1 TABLET(7.5 MG) BY MOUTH AT BEDTIME 90 tablet 0  . omeprazole (PRILOSEC) 20 MG capsule Take 1 capsule (20 mg total) by mouth daily. 30 capsule 0  . ondansetron (ZOFRAN ODT) 4 MG disintegrating tablet Take 1 tablet (4 mg total) by mouth every 8 (eight) hours as needed. 10 tablet 0  . ONETOUCH VERIO test strip USE TO TEST BLOOD SUGAR THREE TIMES DAILY 300 each 2  . oxyCODONE-acetaminophen (PERCOCET/ROXICET) 5-325 MG tablet Take 1-2 tablets by mouth every 8 (eight) hours as needed for severe pain. 15 tablet 0  . potassium chloride  (KLOR-CON 10) 10 MEQ tablet Take 1 tablet (10 mEq total) by mouth daily. 30 tablet 6  . promethazine (PHENERGAN) 25 MG tablet Take 0.5 tablets (12.5 mg total) by mouth every 6 (six) hours as needed for nausea or vomiting. 30 tablet 1  . spironolactone (ALDACTONE) 100 MG tablet Take 2 tablets daily. 60 tablet 6  . triamcinolone ointment (KENALOG) 0.5 % Apply 1 application topically 2 (two) times daily. 30 g 0  . ezetimibe (ZETIA) 10 MG tablet Take 1 tablet (10 mg total) by mouth daily. 90 tablet 3   No current facility-administered medications for this visit.     Allergies as of 03/21/2019 - Review Complete 03/21/2019  Allergen Reaction Noted  . Penicillins Swelling 05/23/2018  .  Lisinopril Cough 04/08/2015  . Tape Hives 01/28/2014  . Doxycycline Hives, Swelling, and Rash   . Latex Hives, Itching, and Rash 09/07/2011    Family History  Problem Relation Age of Onset  . Diabetes Mother   . Heart disease Mother        CAD  . Hyperlipidemia Mother   . Hypertension Mother   . Kidney disease Mother   . Kidney disease Father   . Breast cancer Maternal Aunt   . Heart disease Maternal Grandmother   . Heart disease Other        maternal aunts and uncles  . Colon cancer Neg Hx   . Esophageal cancer Neg Hx     Social History   Socioeconomic History  . Marital status: Married    Spouse name: Not on file  . Number of children: 2  . Years of education: 2 yr colle  . Highest education level: Not on file  Occupational History  . Occupation: housewife  Social Needs  . Financial resource strain: Not on file  . Food insecurity:    Worry: Not on file    Inability: Not on file  . Transportation needs:    Medical: Not on file    Non-medical: Not on file  Tobacco Use  . Smoking status: Former Smoker    Years: 48.00    Types: Cigarettes    Last attempt to quit: 10/18/1986    Years since quitting: 32.4  . Smokeless tobacco: Never Used  Substance and Sexual Activity  . Alcohol use: No     Alcohol/week: 0.0 standard drinks  . Drug use: No  . Sexual activity: Yes    Partners: Male    Birth control/protection: None  Lifestyle  . Physical activity:    Days per week: Not on file    Minutes per session: Not on file  . Stress: Not on file  Relationships  . Social connections:    Talks on phone: Not on file    Gets together: Not on file    Attends religious service: Not on file    Active member of club or organization: Not on file    Attends meetings of clubs or organizations: Not on file    Relationship status: Not on file  . Intimate partner violence:    Fear of current or ex partner: Not on file    Emotionally abused: Not on file    Physically abused: Not on file    Forced sexual activity: Not on file  Other Topics Concern  . Not on file  Social History Narrative   2 caffeine drink per day.  No regular exercise.     Retired from the bank   2 children (Daughter and a son) son has morbid obesity   2 grandchildren   3 great grandchildren     Physical Exam: BP (!) 150/80   Pulse 85   Temp 98.3 F (36.8 C)   Ht 5' 4.5" (1.638 m)   Wt 222 lb (100.7 kg)   BMI 37.52 kg/m  Constitutional: generally well-appearing Psychiatric: alert and oriented x3 Abdomen: soft, nontender, nondistended, no obvious ascites, no peritoneal signs, normal bowel sounds 1+ pitting edema bilaterally in her ankles  Assessment and plan: 78 y.o. female with decompensated cirrhosis  I think we have found a good diuretic regimen for her and she will continue it for now.  200 mg spironolactone daily, 60 mg Lasix daily, 10 mEq of potassium daily.  She knows to  continue avoiding excessive salt in her diet.  She will start taking her weight daily at home and knows to call if it is trending upward.  She will return to see me in 3 to 4 weeks with a basic metabolic profile early that morning.  Please see the "Patient Instructions" section for addition details about the plan.  Owens Loffler,  MD [facility] 03/21/2019, 10:02 AM

## 2019-03-21 NOTE — Patient Instructions (Addendum)
She knows to start taking weights on a daily basis and if her weight increases significantly she will call  She knows to continue avoiding excessive salt in her diet  She will continue her current diuretic regimen which is spironolactone 200 mg daily, Lasix 60 mg daily.  In person return office visit with me in 3 to 4 weeks with basic metabolic profile early that morning  Thank you for entrusting me with your care and choosing [facility].  Dr [name]

## 2019-03-22 DIAGNOSIS — R262 Difficulty in walking, not elsewhere classified: Secondary | ICD-10-CM | POA: Diagnosis not present

## 2019-03-22 DIAGNOSIS — M6281 Muscle weakness (generalized): Secondary | ICD-10-CM | POA: Diagnosis not present

## 2019-03-22 DIAGNOSIS — M25571 Pain in right ankle and joints of right foot: Secondary | ICD-10-CM | POA: Diagnosis not present

## 2019-03-26 MED FILL — JAKAFI 25 MG TABLET: 25 | 30 days supply | Qty: 60 | Fill #2

## 2019-03-27 DIAGNOSIS — M25571 Pain in right ankle and joints of right foot: Secondary | ICD-10-CM | POA: Diagnosis not present

## 2019-03-27 DIAGNOSIS — M6281 Muscle weakness (generalized): Secondary | ICD-10-CM | POA: Diagnosis not present

## 2019-03-27 DIAGNOSIS — R262 Difficulty in walking, not elsewhere classified: Secondary | ICD-10-CM | POA: Diagnosis not present

## 2019-03-28 DIAGNOSIS — S82831A Other fracture of upper and lower end of right fibula, initial encounter for closed fracture: Secondary | ICD-10-CM | POA: Diagnosis not present

## 2019-03-29 DIAGNOSIS — M25571 Pain in right ankle and joints of right foot: Secondary | ICD-10-CM | POA: Diagnosis not present

## 2019-03-29 DIAGNOSIS — M6281 Muscle weakness (generalized): Secondary | ICD-10-CM | POA: Diagnosis not present

## 2019-03-29 DIAGNOSIS — R262 Difficulty in walking, not elsewhere classified: Secondary | ICD-10-CM | POA: Diagnosis not present

## 2019-04-10 DIAGNOSIS — R531 Weakness: Secondary | ICD-10-CM | POA: Diagnosis not present

## 2019-04-10 DIAGNOSIS — S8291XA Unspecified fracture of right lower leg, initial encounter for closed fracture: Secondary | ICD-10-CM | POA: Diagnosis not present

## 2019-04-11 ENCOUNTER — Telehealth: Payer: Self-pay

## 2019-04-11 DIAGNOSIS — M25571 Pain in right ankle and joints of right foot: Secondary | ICD-10-CM | POA: Diagnosis not present

## 2019-04-11 DIAGNOSIS — M6281 Muscle weakness (generalized): Secondary | ICD-10-CM | POA: Diagnosis not present

## 2019-04-11 DIAGNOSIS — R262 Difficulty in walking, not elsewhere classified: Secondary | ICD-10-CM | POA: Diagnosis not present

## 2019-04-11 NOTE — Telephone Encounter (Signed)
Covid-19 screening questions   Do you now or have you had a fever in the last 14 days no   Do you have any respiratory symptoms of shortness of breath or cough now or in the last 14 days no  Do you have any family members or close contacts with diagnosed or suspected Covid-19 in the past 14 days no  Have you been tested for Covid-19 and found to be positive no

## 2019-04-13 ENCOUNTER — Ambulatory Visit (INDEPENDENT_AMBULATORY_CARE_PROVIDER_SITE_OTHER): Payer: PPO | Admitting: Gastroenterology

## 2019-04-13 ENCOUNTER — Other Ambulatory Visit: Payer: Self-pay

## 2019-04-13 ENCOUNTER — Other Ambulatory Visit (INDEPENDENT_AMBULATORY_CARE_PROVIDER_SITE_OTHER): Payer: PPO

## 2019-04-13 ENCOUNTER — Encounter: Payer: Self-pay | Admitting: Gastroenterology

## 2019-04-13 VITALS — BP 138/62 | HR 86 | Temp 97.7°F | Ht 64.5 in | Wt 224.0 lb

## 2019-04-13 DIAGNOSIS — K7460 Unspecified cirrhosis of liver: Secondary | ICD-10-CM

## 2019-04-13 LAB — BASIC METABOLIC PANEL
BUN: 41 mg/dL — ABNORMAL HIGH (ref 6–23)
CO2: 23 mEq/L (ref 19–32)
Calcium: 9.4 mg/dL (ref 8.4–10.5)
Chloride: 99 mEq/L (ref 96–112)
Creatinine, Ser: 2.13 mg/dL — ABNORMAL HIGH (ref 0.40–1.20)
GFR: 22.31 mL/min — ABNORMAL LOW (ref >60.00)
Glucose, Bld: 148 mg/dL — ABNORMAL HIGH (ref 70–99)
Potassium: 4.6 mEq/L (ref 3.5–5.1)
Sodium: 135 mEq/L (ref 135–145)

## 2019-04-13 NOTE — Patient Instructions (Addendum)
[place] for hepatoma screening.  Labs in 3 weeks (cbc, cmet, inr, AFP) for cirrhosis, Hepatoma screening.05/04/19  You have been scheduled for an abdominal ultrasound at [facility] (1st floor of hospital) on 04/17/19 at 10am. Please arrive 15 minutes prior to your appointment for registration. Make certain not to have anything to eat or drink 6 hours prior to your appointment. Should you need to reschedule your appointment, please contact radiology at [phone]. This test typically takes about 30 minutes to perform.  Return office visit in September  Thank you for entrusting me with your care and choosing [facility]  Dr [name]

## 2019-04-13 NOTE — Progress Notes (Signed)
Review of pertinent gastrointestinal problems: 1. 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  AFP 08/2014 normal... 01/2018 4.7 (normal)  EGD 02/2014 no portal hypertensive changes; + irregular z line, biopsies showed no IM:EGD 05/2018, Dr. Lyndel Safe while hospitalized, gastritis, mild duodenitis. No portal hypertensive signs.  Immunized for hep a/b 2015  Ascites managedwith relatively low dose aldactone, lasix;  Fluid testing 05/2018 SAAG elevated, no SBP  MELD score 10 (09/2018 labs) 2. C. Diff colitis:diarrhea following abx for buttocks abscess 2015; C. Diff PCR +, C. Diff toxin A/B positive; treated with flagyl, vanc    HPI: This is a very pleasant 78 year old woman whom I last saw about 3 weeks ago.  She is with her husband again today.  At the time of her last visit it was decided that she would continue spironolactone 200 mg daily, Lasix 60 mg daily, potassium 10 mEq daily.  She tells me she has been doing fine since her last visit.  She continues to avoid extra salt in her diet and does not drink too much extra water.  No overt GI bleeding.  She did have some nausea yesterday but no vomiting.  No significant abdominal pains.  She is happy that the swelling in her legs continues to be very manageable.  No trouble breathing  Chief complaint is cirrhosis  Her weight is up 2 pounds since her last visit here about 3 weeks ago.  ROS: complete GI ROS as described in HPI, all other review negative.  Constitutional:  No unintentional weight loss   Past Medical History:  Diagnosis Date  . Allergic rhinitis 01/23/2016  . C.  difficile colitis   . CAD (coronary artery disease)    a. s/p STEMI in 01/2015 with 95% LCx stenosis and distal 80% LCx stenosis (DESx2 placed)  . Cancer ([place])    SKIN  . Candidiasis of skin 09/30/2014  . Cirrhosis ([place])   . Depression   . Diabetes mellitus 2008  . Gout   . Herpes simplex   . Hyperglycemia 05/31/2013  . Hyperlipidemia   . Hypertension   . Myocardial infarction ([place]   . 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 Obesity   . Polycythemia    Dr. Elease Hashimoto- [facility]  . Psoriasis     Past Surgical History:  Procedure Laterality Date  . ANKLE FRACTURE SURGERY    . BIOPSY  05/26/2018   Procedure: BIOPSY;  Surgeon: [name], MD;  Location: [facility] ENDOSCOPY;  Service: Endoscopy;;  . BREAST SURGERY Left    milk duct  . CATARACT EXTRACTION, BILATERAL    . COLONOSCOPY    . ESOPHAGOGASTRODUODENOSCOPY N/A 05/26/2018   Procedure: ESOPHAGOGASTRODUODENOSCOPY (EGD);  Surgeon: [name], MD;  Location: [facility] ENDOSCOPY;  Service: Endoscopy;  Laterality: N/A;  . IR PARACENTESIS  05/25/2018  . LEFT HEART CATHETERIZATION WITH CORONARY ANGIOGRAM N/A 01/27/2015   Procedure: LEFT HEART CATHETERIZATION WITH CORONARY ANGIOGRAM;  Surgeon: [name], MD;  Location: [facility] CATH LAB;  Service: Cardiovascular;  Laterality: N/A;  . TONSILLECTOMY    . TOTAL ABDOMINAL HYSTERECTOMY W/ BILATERAL SALPINGOOPHORECTOMY     for heavy periods with appendectomy    Current Outpatient Medications  Medication  Sig Dispense Refill  . acetaminophen (TYLENOL) 325 MG tablet Take 2 tablets (650 mg total) by mouth every 6 (six) hours as needed for mild pain (or Fever >/= 101).    Marland Kitchen amLODipine (NORVASC) 5 MG tablet Take 1 tablet (5 mg total) by mouth daily. 90 tablet 3  . Blood Glucose Monitoring Suppl (ACCU-CHEK GUIDE) w/Device KIT 1 kit by Does not apply route as directed. 1 kit 0  . clopidogrel (PLAVIX) 75 MG tablet TAKE 1 TABLET(75 MG) BY MOUTH DAILY 90 tablet 3  . diphenoxylate-atropine (LOMOTIL)  2.5-0.025 MG tablet Take 1 tablet by mouth twice daily as needed for diarrhea. 50 tablet 2  . Dulaglutide (TRULICITY) 1.5 DT/2.6ZT SOPN Inject 1.5 mg into the skin once a week. 4 pen 1  . fluticasone (FLONASE) 50 MCG/ACT nasal spray Place 1 spray into both nostrils 2 (two) times daily. 16 g 3  . furosemide (LASIX) 20 MG tablet Take (3)  20 mg tabs to equal 60 mg daily 90 tablet 6  . glucose blood (ACCU-CHEK GUIDE) test strip Use to check blood sugars 3 times daily 100 each 12  . Insulin Pen Needle (BD PEN NEEDLE NANO U/F) 32G X 4 MM MISC USE AS DIRECTED TO INJECT INSULIN 100 each 0  . insulin regular human CONCENTRATED (HUMULIN R U-500 KWIKPEN) 500 UNIT/ML kwikpen Inject 90 units in am and 80 units before dinner, under skin 6 pen 3  . JAKAFI 25 MG tablet TAKE 1 TABLET (25 MG TOTAL) BY MOUTH 2 (TWO) TIMES DAILY. 60 tablet 6  . levothyroxine (SYNTHROID) 25 MCG tablet Take 1 tablet (25 mcg total) by mouth daily before breakfast. 30 tablet 0  . loperamide (IMODIUM A-D) 2 MG tablet Take 1-2 tablets (2-4 mg total) by mouth daily as needed for diarrhea or loose stools. 30 tablet 1  . losartan (COZAAR) 100 MG tablet Take 100 mg by mouth daily.  0  . mirtazapine (REMERON) 7.5 MG tablet TAKE 1 TABLET(7.5 MG) BY MOUTH AT BEDTIME 90 tablet 0  . omeprazole (PRILOSEC) 20 MG capsule Take 1 capsule (20 mg total) by mouth daily. 30 capsule 0  . ondansetron (ZOFRAN ODT) 4 MG disintegrating tablet Take 1 tablet (4 mg total) by mouth every 8 (eight) hours as needed. 10 tablet 0  . ONETOUCH VERIO test strip USE TO TEST BLOOD SUGAR THREE TIMES DAILY 300 each 2  . oxyCODONE-acetaminophen (PERCOCET/ROXICET) 5-325 MG tablet Take 1-2 tablets by mouth every 8 (eight) hours as needed for severe pain. 15 tablet 0  . potassium chloride (KLOR-CON 10) 10 MEQ tablet Take 1 tablet (10 mEq total) by mouth daily. 30 tablet 6  . promethazine (PHENERGAN) 25 MG tablet Take 0.5 tablets (12.5 mg total) by mouth every 6 (six) hours as  needed for nausea or vomiting. 30 tablet 1  . spironolactone (ALDACTONE) 100 MG tablet Take 2 tablets daily. 60 tablet 6  . triamcinolone ointment (KENALOG) 0.5 % Apply 1 application topically 2 (two) times daily. 30 g 0  . ezetimibe (ZETIA) 10 MG tablet Take 1 tablet (10 mg total) by mouth daily. 90 tablet 3   No current facility-administered medications for this visit.     Allergies as of 04/13/2019 - Review Complete 04/13/2019  Allergen Reaction Noted  . Penicillins Swelling 05/23/2018  . Lisinopril Cough 04/08/2015  . Tape Hives 01/28/2014  . Doxycycline Hives, Swelling, and Rash   . Latex Hives, Itching, and Rash 09/07/2011    Family History  Problem Relation Age of  Onset  . Diabetes Mother   . Heart disease Mother        CAD  . Hyperlipidemia Mother   . Hypertension Mother   . Kidney disease Mother   . Kidney disease Father   . Breast cancer Maternal Aunt   . Heart disease Maternal Grandmother   . Heart disease Other        maternal aunts and uncles  . Colon cancer Neg Hx   . Esophageal cancer Neg Hx     Social History   Socioeconomic History  . Marital status: Married    Spouse name: [name]  . Number of children: 2  . Years of education: 2 yr colle  . Highest education level: Not on file  Occupational History  . Occupation: housewife  Social Needs  . Financial resource strain: Not on file  . Food insecurity    Worry: Not on file    Inability: Not on file  . Transportation needs    Medical: Not on file    Non-medical: Not on file  Tobacco Use  . Smoking status: Former Smoker    Years: 48.00    Types: Cigarettes    Quit date: 10/18/1986    Years since quitting: 32.5  . Smokeless tobacco: Never Used  Substance and Sexual Activity  . Alcohol use: No    Alcohol/week: 0.0 standard drinks  . Drug use: No  . Sexual activity: Yes    Partners: Male    Birth control/protection: None  Lifestyle  . Physical activity    Days per week: Not on file    Minutes  per session: Not on file  . Stress: Not on file  Relationships  . Social Herbalist on phone: Not on file    Gets together: Not on file    Attends religious service: Not on file    Active member of club or organization: Not on file    Attends meetings of clubs or organizations: Not on file    Relationship status: Not on file  . Intimate partner violence    Fear of current or ex partner: Not on file    Emotionally abused: Not on file    Physically abused: Not on file    Forced sexual activity: Not on file  Other Topics Concern  . Not on file  Social History Narrative   2 caffeine drink per day.  No regular exercise.     Retired from the bank   2 children (Daughter and a son) son has morbid obesity   2 grandchildren   3 great grandchildren     Physical Exam: BP 138/62   Pulse 86   Temp 97.7 F (36.5 C)   Ht 5' 4.5" (1.638 m)   Wt 224 lb (101.6 kg)   BMI 37.86 kg/m  Constitutional: Chronically ill, obese Psychiatric: alert and oriented x3 Abdomen: soft, nontender, nondistended, no obvious ascites, no peritoneal signs, normal bowel sounds 1+ pitting edema in ankles, left leg greater than right leg  Assessment and plan: 78 y.o. female with Karlene Lineman related cirrhosis  I think she is in a good place with her fluid status on current diuretics.  She had a basic metabolic profile checked just prior to this visit and after reviewing that I may have to adjust her diuretics or potassium dosing.  She is due for hepatoma screening and so we will arrange right upper quadrant ultrasound, alpha-fetoprotein.  Also plan to restage her liver disease formally to  calculate meld score in 4 5 weeks with repeat labs, CBC complete metabolic profile, coags and alpha-fetoprotein.  She will return to see me in 3 months and sooner if needed.  Please see the "Patient Instructions" section for addition details about the plan.  Owens Loffler, MD [facility] 04/13/2019, 9:34 AM

## 2019-04-17 ENCOUNTER — Other Ambulatory Visit: Payer: Self-pay

## 2019-04-17 ENCOUNTER — Ambulatory Visit (HOSPITAL_COMMUNITY)
Admission: RE | Admit: 2019-04-17 | Discharge: 2019-04-17 | Disposition: A | Discharge status: Home / Self Care | Payer: PPO | Source: Ambulatory Visit | Attending: Gastroenterology | Admitting: Gastroenterology

## 2019-04-17 DIAGNOSIS — K7460 Unspecified cirrhosis of liver: Secondary | ICD-10-CM

## 2019-04-18 ENCOUNTER — Ambulatory Visit (HOSPITAL_COMMUNITY)
Admission: RE | Admit: 2019-04-18 | Discharge: 2019-04-18 | Disposition: A | Discharge status: Home / Self Care | Payer: PPO | Source: Ambulatory Visit | Attending: Gastroenterology | Admitting: Gastroenterology

## 2019-04-18 DIAGNOSIS — K7460 Unspecified cirrhosis of liver: Secondary | ICD-10-CM | POA: Diagnosis not present

## 2019-04-19 ENCOUNTER — Other Ambulatory Visit (INDEPENDENT_AMBULATORY_CARE_PROVIDER_SITE_OTHER): Payer: PPO

## 2019-04-19 DIAGNOSIS — K7460 Unspecified cirrhosis of liver: Secondary | ICD-10-CM

## 2019-04-19 LAB — CBC WITH DIFFERENTIAL/PLATELET
Basophils Absolute: 0.2 10*3/uL — ABNORMAL HIGH (ref 0.0–0.1)
Basophils Relative: 0.9 % (ref 0.0–3.0)
Eosinophils Absolute: 0.2 10*3/uL (ref 0.0–0.7)
Eosinophils Relative: 0.9 % (ref 0.0–5.0)
HCT: 39.3 % (ref 36.0–46.0)
Hemoglobin: 13.2 g/dL (ref 12.0–15.0)
Lymphocytes Relative: 13.9 % (ref 12.0–46.0)
Lymphs Abs: 3.3 10*3/uL (ref 0.7–4.0)
MCHC: 33.6 g/dL (ref 30.0–36.0)
MCV: 90.9 fl (ref 78.0–100.0)
Monocytes Absolute: 1.3 10*3/uL — ABNORMAL HIGH (ref 0.1–1.0)
Monocytes Relative: 5.6 % (ref 3.0–12.0)
Neutro Abs: 18.8 10*3/uL — ABNORMAL HIGH (ref 1.4–7.7)
Neutrophils Relative %: 78.7 % — ABNORMAL HIGH (ref 43.0–77.0)
Platelets: 362 10*3/uL (ref 150.0–400.0)
RBC: 4.32 Mil/uL (ref 3.87–5.11)
RDW: 19.4 % — ABNORMAL HIGH (ref 11.5–15.5)
WBC: 23.8 10*3/uL (ref 4.0–10.5)

## 2019-04-19 LAB — COMPREHENSIVE METABOLIC PANEL
ALT: 13 U/L (ref 0–35)
AST: 25 U/L (ref 0–37)
Albumin: 4.4 g/dL (ref 3.5–5.2)
Alkaline Phosphatase: 89 U/L (ref 39–117)
BUN: 33 mg/dL — ABNORMAL HIGH (ref 6–23)
CO2: 20 mEq/L (ref 19–32)
Calcium: 9.2 mg/dL (ref 8.4–10.5)
Chloride: 102 mEq/L (ref 96–112)
Creatinine, Ser: 1.85 mg/dL — ABNORMAL HIGH (ref 0.40–1.20)
GFR: 26.25 mL/min — ABNORMAL LOW (ref >60.00)
Glucose, Bld: 157 mg/dL — ABNORMAL HIGH (ref 70–99)
Potassium: 4.7 mEq/L (ref 3.5–5.1)
Sodium: 133 mEq/L — ABNORMAL LOW (ref 135–145)
Total Bilirubin: 0.9 mg/dL (ref 0.2–1.2)
Total Protein: 7.2 g/dL (ref 6.0–8.3)

## 2019-04-19 LAB — PROTIME-INR
INR: 1.2 ratio — ABNORMAL HIGH (ref 0.8–1.0)
Prothrombin Time: 14.1 s — ABNORMAL HIGH (ref 9.6–13.1)

## 2019-04-23 ENCOUNTER — Other Ambulatory Visit: Payer: Self-pay

## 2019-04-23 DIAGNOSIS — K7460 Unspecified cirrhosis of liver: Secondary | ICD-10-CM

## 2019-04-23 LAB — AFP TUMOR MARKER: AFP-Tumor Marker: 3.5 ng/mL

## 2019-04-23 NOTE — Progress Notes (Signed)
bmet  

## 2019-04-25 ENCOUNTER — Inpatient Hospital Stay: Payer: PPO | Attending: Family

## 2019-04-25 ENCOUNTER — Other Ambulatory Visit: Payer: Self-pay

## 2019-04-25 ENCOUNTER — Inpatient Hospital Stay (HOSPITAL_BASED_OUTPATIENT_CLINIC_OR_DEPARTMENT_OTHER): Payer: PPO | Admitting: Hematology & Oncology

## 2019-04-25 ENCOUNTER — Encounter: Payer: Self-pay | Admitting: Hematology & Oncology

## 2019-04-25 VITALS — BP 144/64 | HR 88 | Temp 99.7°F | Resp 24 | Wt 224.0 lb

## 2019-04-25 DIAGNOSIS — D45 Polycythemia vera: Secondary | ICD-10-CM | POA: Diagnosis not present

## 2019-04-25 DIAGNOSIS — D500 Iron deficiency anemia secondary to blood loss (chronic): Secondary | ICD-10-CM

## 2019-04-25 DIAGNOSIS — E119 Type 2 diabetes mellitus without complications: Secondary | ICD-10-CM | POA: Insufficient documentation

## 2019-04-25 LAB — CMP (CANCER CENTER ONLY)
ALT: 20 U/L (ref 0–44)
AST: 31 U/L (ref 15–41)
Albumin: 4.5 g/dL (ref 3.5–5.0)
Alkaline Phosphatase: 95 U/L (ref 38–126)
Anion gap: 10 (ref 5–15)
BUN: 28 mg/dL — ABNORMAL HIGH (ref 8–23)
CO2: 24 mmol/L (ref 22–32)
Calcium: 9.2 mg/dL (ref 8.9–10.3)
Chloride: 101 mmol/L (ref 98–111)
Creatinine: 1.7 mg/dL — ABNORMAL HIGH (ref 0.44–1.00)
GFR, Est AFR Am: 33 mL/min — ABNORMAL LOW (ref >60)
GFR, Estimated: 28 mL/min — ABNORMAL LOW (ref >60)
Glucose, Bld: 137 mg/dL — ABNORMAL HIGH (ref 70–99)
Potassium: 4.4 mmol/L (ref 3.5–5.1)
Sodium: 135 mmol/L (ref 135–145)
Total Bilirubin: 1.2 mg/dL (ref 0.3–1.2)
Total Protein: 7.1 g/dL (ref 6.5–8.1)

## 2019-04-25 LAB — CBC WITH DIFFERENTIAL (CANCER CENTER ONLY)
Abs Immature Granulocytes: 1.83 10*3/uL — ABNORMAL HIGH (ref 0.00–0.07)
Basophils Absolute: 0.2 10*3/uL — ABNORMAL HIGH (ref 0.0–0.1)
Basophils Relative: 1 %
Eosinophils Absolute: 0 10*3/uL (ref 0.0–0.5)
Eosinophils Relative: 0 %
HCT: 40.7 % (ref 36.0–46.0)
Hemoglobin: 13.4 g/dL (ref 12.0–15.0)
Immature Granulocytes: 8 %
Lymphocytes Relative: 10 %
Lymphs Abs: 2.5 10*3/uL (ref 0.7–4.0)
MCH: 30.1 pg (ref 26.0–34.0)
MCHC: 32.9 g/dL (ref 30.0–36.0)
MCV: 91.5 fL (ref 80.0–100.0)
Monocytes Absolute: 1.7 10*3/uL — ABNORMAL HIGH (ref 0.1–1.0)
Monocytes Relative: 7 %
Neutro Abs: 18.4 10*3/uL — ABNORMAL HIGH (ref 1.7–7.7)
Neutrophils Relative %: 74 %
Platelet Count: 301 10*3/uL (ref 150–400)
RBC: 4.45 MIL/uL (ref 3.87–5.11)
RDW: 17.2 % — ABNORMAL HIGH (ref 11.5–15.5)
WBC Count: 24.6 10*3/uL — ABNORMAL HIGH (ref 4.0–10.5)
nRBC: 0.1 % (ref 0.0–0.2)

## 2019-04-25 LAB — RETICULOCYTES
Immature Retic Fract: 26 % — ABNORMAL HIGH (ref 2.3–15.9)
RBC.: 4.45 MIL/uL (ref 3.87–5.11)
Retic Count, Absolute: 80.1 10*3/uL (ref 19.0–186.0)
Retic Ct Pct: 1.8 % (ref 0.4–3.1)

## 2019-04-25 LAB — SAVE SMEAR(SSMR), FOR PROVIDER SLIDE REVIEW

## 2019-04-25 LAB — LACTATE DEHYDROGENASE: LDH: 317 U/L — ABNORMAL HIGH (ref 98–192)

## 2019-04-25 NOTE — Progress Notes (Signed)
Hematology and Oncology Follow Up Visit  Amanda [name] [id number] 1940/10/28 78 y.o. 04/25/2019   Principle Diagnosis:  [name] - JAK2 (+)  Current Therapy:   Jakafi 25 mg po BID   Interim History:  Amanda [name] is here today for follow-up.  So far, she really looks fantastic.  Despite the coronavirus and all the restrictions, she is doing quite well.  She is basically staying home.  She really is going to very few places.  She is not having any friends or family come see her.  She is doing much better with her diabetes.  I think the key was getting her on Trulicity.  Her family doctor clearly did a good job with this.  She is eating okay.  She is trying to watch what she eats.  She has had no problems with bleeding.  There is no fever.  She has had no cough.  There is been no leg swelling.  Overall, her performance status is ECOG 2.  Medications:  Allergies as of 04/25/2019      Reactions   Penicillins Swelling   Has patient had a PCN reaction causing immediate rash, facial/tongue/throat swelling, SOB or lightheadedness with hypotension: No Has patient had a PCN reaction causing severe rash involving mucus membranes or skin necrosis: No Has patient had a PCN reaction that required hospitalization: No Has patient had a PCN reaction occurring within the last 10 years: No If all of the above answers are "NO", then may proceed with Cephalosporin use.   Lisinopril Cough   Tape Hives   Doxycycline Hives, Swelling, Rash   Latex Hives, Itching, Rash      Medication List       Accurate as of April 25, 2019  9:54 AM. If you have any questions, ask your nurse or doctor.        Accu-Chek Guide w/Device Kit 1 kit by Does not apply route as directed.   acetaminophen 325 MG tablet Commonly known as: TYLENOL Take 2 tablets (650 mg total) by mouth every 6 (six) hours as needed for mild pain (or Fever >/= 101).   amLODipine 5 MG tablet Commonly known as: NORVASC Take 1 tablet  (5 mg total) by mouth daily.   clopidogrel 75 MG tablet Commonly known as: PLAVIX TAKE 1 TABLET(75 MG) BY MOUTH DAILY   diphenoxylate-atropine 2.5-0.025 MG tablet Commonly known as: LOMOTIL Take 1 tablet by mouth twice daily as needed for diarrhea.   Dulaglutide 1.5 MG/0.5ML Sopn Commonly known as: Trulicity Inject 1.5 mg into the skin once a week.   ezetimibe 10 MG tablet Commonly known as: ZETIA Take 1 tablet (10 mg total) by mouth daily.   fluticasone 50 MCG/ACT nasal spray Commonly known as: Flonase Place 1 spray into both nostrils 2 (two) times daily.   furosemide 20 MG tablet Commonly known as: Lasix Take (3)  20 mg tabs to equal 60 mg daily What changed:   how much to take  when to take this  additional instructions   glucose blood test strip Commonly known as: Accu-Chek Guide Use to check blood sugars 3 times daily   OneTouch Verio test strip Generic drug: glucose blood USE TO TEST BLOOD SUGAR THREE TIMES DAILY   Insulin Pen Needle 32G X 4 MM Misc Commonly known as: BD Pen Needle Nano U/F USE AS DIRECTED TO INJECT INSULIN   insulin regular human CONCENTRATED 500 UNIT/ML kwikpen Commonly known as: HumuLIN R U-500 KwikPen Inject 90 units in am  and 80 units before dinner, under skin   Jakafi 25 MG tablet Generic drug: ruxolitinib phosphate TAKE 1 TABLET (25 MG TOTAL) BY MOUTH 2 (TWO) TIMES DAILY.   latanoprost 0.005 % ophthalmic solution Commonly known as: XALATAN at bedtime.   levothyroxine 25 MCG tablet Commonly known as: Synthroid Take 1 tablet (25 mcg total) by mouth daily before breakfast.   loperamide 2 MG tablet Commonly known as: IMODIUM A-D Take 1-2 tablets (2-4 mg total) by mouth daily as needed for diarrhea or loose stools.   losartan 100 MG tablet Commonly known as: COZAAR Take 100 mg by mouth daily.   mirtazapine 7.5 MG tablet Commonly known as: REMERON TAKE 1 TABLET(7.5 MG) BY MOUTH AT BEDTIME   omeprazole 20 MG capsule  Commonly known as: PRILOSEC Take 1 capsule (20 mg total) by mouth daily.   ondansetron 4 MG disintegrating tablet Commonly known as: Zofran ODT Take 1 tablet (4 mg total) by mouth every 8 (eight) hours as needed.   oxyCODONE-acetaminophen 5-325 MG tablet Commonly known as: PERCOCET/ROXICET Take 1-2 tablets by mouth every 8 (eight) hours as needed for severe pain.   potassium chloride 10 MEQ tablet Commonly known as: Klor-Con 10 Take 1 tablet (10 mEq total) by mouth daily.   promethazine 25 MG tablet Commonly known as: PHENERGAN Take 0.5 tablets (12.5 mg total) by mouth every 6 (six) hours as needed for nausea or vomiting.   spironolactone 100 MG tablet Commonly known as: ALDACTONE Take 2 tablets daily. What changed:   how much to take  when to take this  additional instructions   triamcinolone ointment 0.5 % Commonly known as: KENALOG Apply 1 application topically 2 (two) times daily.       Allergies:  Allergies  Allergen Reactions  . Penicillins Swelling    Has patient had a PCN reaction causing immediate rash, facial/tongue/throat swelling, SOB or lightheadedness with hypotension: No Has patient had a PCN reaction causing severe rash involving mucus membranes or skin necrosis: No Has patient had a PCN reaction that required hospitalization: No Has patient had a PCN reaction occurring within the last 10 years: No If all of the above answers are "NO", then may proceed with Cephalosporin use.  Marland Kitchen Lisinopril Cough  . Tape Hives  . Doxycycline Hives, Swelling and Rash  . Latex Hives, Itching and Rash    Past Medical History, Surgical history, Social history, and Family History were reviewed and updated.  Review of Systems: Review of Systems  Constitutional: Negative.   HENT: Negative.   Eyes: Negative.   Respiratory: Negative.   Cardiovascular: Negative.   Gastrointestinal: Negative.   Genitourinary: Negative.   Musculoskeletal: Negative.   Skin: Negative.    Neurological: Negative.   Endo/Heme/Allergies: Negative.   Psychiatric/Behavioral: Negative.    Marland Kitchen   Physical Exam:  weight is 224 lb (101.6 kg). Her oral temperature is 99.7 F (37.6 C). Her blood pressure is 144/64 (abnormal) and her pulse is 88. Her respiration is 24 (abnormal) and oxygen saturation is 98%.   Wt Readings from Last 3 Encounters:  04/25/19 224 lb (101.6 kg)  04/13/19 224 lb (101.6 kg)  03/21/19 222 lb (100.7 kg)    Physical Exam Vitals signs reviewed.  HENT:     Head: Normocephalic and atraumatic.  Eyes:     Pupils: Pupils are equal, round, and reactive to light.  Neck:     Musculoskeletal: Normal range of motion.  Cardiovascular:     Rate and Rhythm: Normal rate and  regular rhythm.     Heart sounds: Normal heart sounds.  Pulmonary:     Effort: Pulmonary effort is normal.     Breath sounds: Normal breath sounds.  Abdominal:     General: Bowel sounds are normal.     Palpations: Abdomen is soft.  Musculoskeletal: Normal range of motion.        General: No tenderness or deformity.  Lymphadenopathy:     Cervical: No cervical adenopathy.  Skin:    General: Skin is warm and dry.     Findings: No erythema or rash.  Neurological:     Mental Status: She is alert and oriented to person, place, and time.  Psychiatric:        Behavior: Behavior normal.        Thought Content: Thought content normal.        Judgment: Judgment normal.      Lab Results  Component Value Date   WBC 24.6 (H) 04/25/2019   HGB 13.4 04/25/2019   HCT 40.7 04/25/2019   MCV 91.5 04/25/2019   PLT 301 04/25/2019   Lab Results  Component Value Date   FERRITIN 104 01/24/2019   IRON 110 01/24/2019   TIBC 329 01/24/2019   UIBC 218 01/24/2019   IRONPCTSAT 34 01/24/2019   Lab Results  Component Value Date   RETICCTPCT 1.8 04/25/2019   RBC 4.45 04/25/2019   RBC 4.45 04/25/2019   No results found for: [facility] Lab Results  Component Value Date    IGA 233 03/29/2014   No results found for: Kathrynn Ducking, MSPIKE, SPEI   Chemistry      Component Value Date/Time   NA 135 04/25/2019 0832   NA 140 10/12/2017 1013   NA 138 01/21/2017 0806   K 4.4 04/25/2019 0832   K 3.7 10/12/2017 1013   K 4.1 01/21/2017 0806   CL 101 04/25/2019 0832   CL 103 10/12/2017 1013   CL 103 02/16/2013 0900   CO2 24 04/25/2019 0832   CO2 25 10/12/2017 1013   CO2 21 (L) 01/21/2017 0806   BUN 28 (H) 04/25/2019 0832   BUN 10 10/12/2017 1013   BUN 12.2 01/21/2017 0806   CREATININE 1.70 (H) 04/25/2019 0832   CREATININE 0.9 10/12/2017 1013   CREATININE 1.0 01/21/2017 0806      Component Value Date/Time   CALCIUM 9.2 04/25/2019 0832   CALCIUM 8.7 10/12/2017 1013   CALCIUM 9.3 01/21/2017 0806   ALKPHOS 95 04/25/2019 0832   ALKPHOS 97 (H) 10/12/2017 1013   ALKPHOS 112 01/21/2017 0806   AST 31 04/25/2019 0832   AST 37 (H) 01/21/2017 0806   ALT 20 04/25/2019 0832   ALT 25 10/12/2017 1013   ALT 16 01/21/2017 0806   BILITOT 1.2 04/25/2019 0832   BILITOT 0.89 01/21/2017 0806       Impression and Plan: Ms. Donalson is a very pleasant 78 yo caucasian female with polycythemia vera, JAK-2 positive.   I am so glad that the Shanon Brow is doing so well for her.  Her blood counts are being controlled and are holding steady.  With Ms. Machorro, her prognosis is based upon her diabetes.  Blood sugar is doing much better.  I think we get her back in September now.  I do not see any issues that we have to deal with right now.  As always, we have fellowship.  They like to talk quite a bit.  I enjoy listening.  Volanda Napoleon, MD 7/8/20209:54 AM

## 2019-04-26 LAB — IRON AND TIBC
Iron: 97 ug/dL (ref 41–142)
Saturation Ratios: 28 % (ref 21–57)
TIBC: 351 ug/dL (ref 236–444)
UIBC: 255 ug/dL (ref 120–384)

## 2019-04-26 LAB — FERRITIN: Ferritin: 202 ng/mL (ref 11–307)

## 2019-04-30 DIAGNOSIS — H401133 Primary open-angle glaucoma, bilateral, severe stage: Secondary | ICD-10-CM | POA: Diagnosis not present

## 2019-04-30 DIAGNOSIS — H5213 Myopia, bilateral: Secondary | ICD-10-CM | POA: Diagnosis not present

## 2019-04-30 DIAGNOSIS — E113393 Type 2 diabetes mellitus with moderate nonproliferative diabetic retinopathy without macular edema, bilateral: Secondary | ICD-10-CM | POA: Diagnosis not present

## 2019-04-30 DIAGNOSIS — Z961 Presence of intraocular lens: Secondary | ICD-10-CM | POA: Diagnosis not present

## 2019-04-30 LAB — HM DIABETES EYE EXAM

## 2019-04-30 MED FILL — JAKAFI 25 MG TABLET: 25 | 30 days supply | Qty: 60 | Fill #3

## 2019-05-01 DIAGNOSIS — M6281 Muscle weakness (generalized): Secondary | ICD-10-CM | POA: Diagnosis not present

## 2019-05-01 DIAGNOSIS — M25571 Pain in right ankle and joints of right foot: Secondary | ICD-10-CM | POA: Diagnosis not present

## 2019-05-01 DIAGNOSIS — R262 Difficulty in walking, not elsewhere classified: Secondary | ICD-10-CM | POA: Diagnosis not present

## 2019-05-03 ENCOUNTER — Other Ambulatory Visit (INDEPENDENT_AMBULATORY_CARE_PROVIDER_SITE_OTHER): Payer: PPO

## 2019-05-03 DIAGNOSIS — K7460 Unspecified cirrhosis of liver: Secondary | ICD-10-CM

## 2019-05-03 LAB — BASIC METABOLIC PANEL
BUN: 26 mg/dL — ABNORMAL HIGH (ref 6–23)
CO2: 22 mEq/L (ref 19–32)
Calcium: 9.4 mg/dL (ref 8.4–10.5)
Chloride: 104 mEq/L (ref 96–112)
Creatinine, Ser: 1.53 mg/dL — ABNORMAL HIGH (ref 0.40–1.20)
GFR: 32.68 mL/min — ABNORMAL LOW (ref >60.00)
Glucose, Bld: 227 mg/dL — ABNORMAL HIGH (ref 70–99)
Potassium: 4.4 mEq/L (ref 3.5–5.1)
Sodium: 136 mEq/L (ref 135–145)

## 2019-05-08 ENCOUNTER — Telehealth: Payer: Self-pay | Admitting: Gastroenterology

## 2019-05-08 NOTE — Telephone Encounter (Signed)
The pt has been advised that Dr [name] is off this week and he will review the labs when he is back in the office and we will call her as soon as available. The pt has been advised of the information and verbalized understanding.

## 2019-05-10 ENCOUNTER — Other Ambulatory Visit: Payer: Self-pay | Admitting: Internal Medicine

## 2019-05-10 ENCOUNTER — Other Ambulatory Visit: Payer: Self-pay

## 2019-05-10 DIAGNOSIS — K7460 Unspecified cirrhosis of liver: Secondary | ICD-10-CM

## 2019-05-10 DIAGNOSIS — R531 Weakness: Secondary | ICD-10-CM | POA: Diagnosis not present

## 2019-05-10 DIAGNOSIS — S8291XA Unspecified fracture of right lower leg, initial encounter for closed fracture: Secondary | ICD-10-CM | POA: Diagnosis not present

## 2019-05-16 IMAGING — CT CT ANGIO NECK
1 of 12 series · 4 of 33 positions shown · IV contrast (APPLIED)
Comparison: MRI brain 03/22/2018.

CLINICAL DATA: Punctate white matter infarct of the right corona
radiata. Stroke follow-up.

EXAM:
CT ANGIOGRAPHY HEAD AND NECK
TECHNIQUE: Multidetector CT imaging of the head and neck was performed using
the standard protocol during bolus administration of intravenous
contrast. Multiplanar CT image reconstructions and MIPs were
obtained to evaluate the vascular anatomy. Carotid stenosis
measurements (when applicable) are obtained utilizing NASCET
criteria, using the distal internal carotid diameter as the
denominator.
CONTRAST:  50mL I0ROO1-2R1 IOPAMIDOL (I0ROO1-2R1) INJECTION 76%

[Series 12: ax thins · axial · 0.39mm/px · z∈[-225,-33]mm · 4 of 321 slices shown]
[im 65/321  soft-tissue]
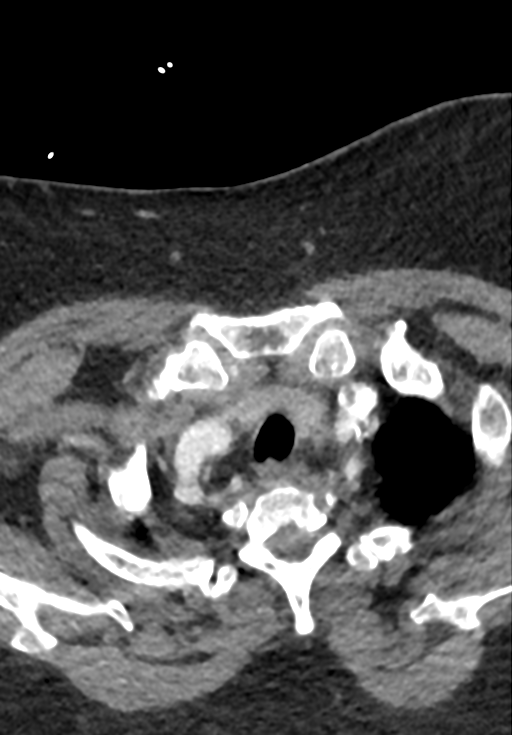
[im 129/321  bone]
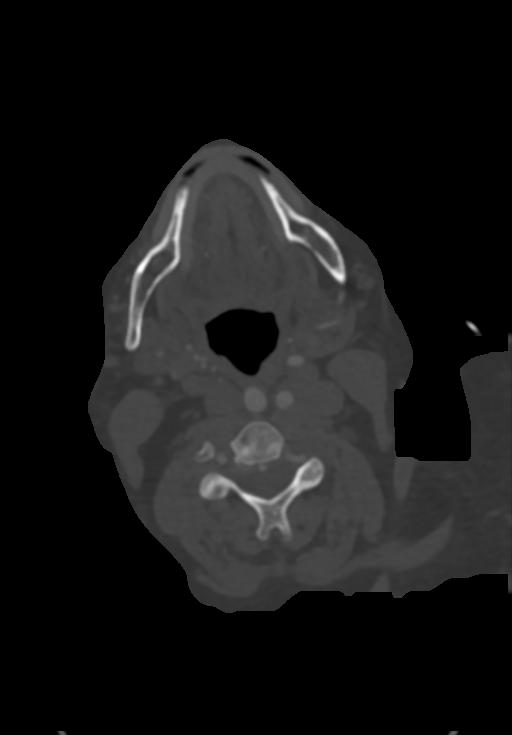
[im 193/321  soft-tissue]
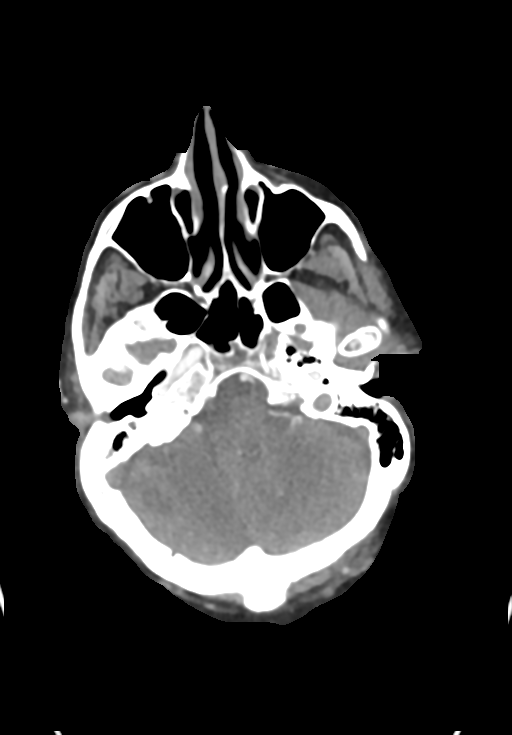
[im 257/321  bone]
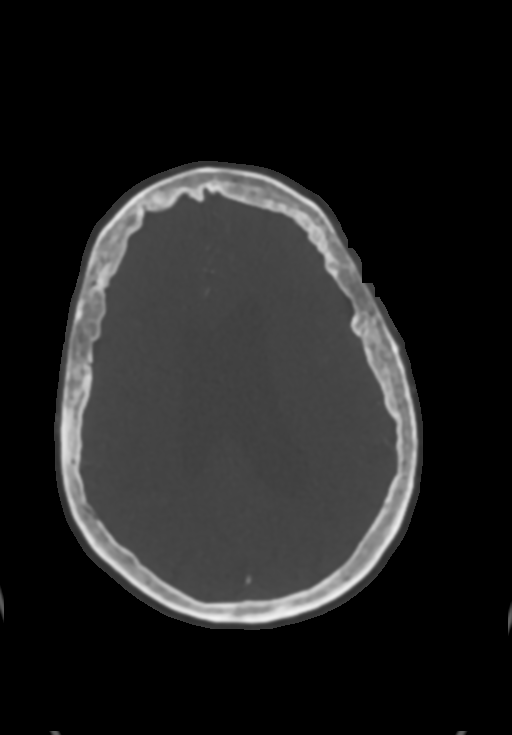

[4 of 33 positions shown; findings below may reference images not displayed]

FINDINGS: CT HEAD FINDINGS

Brain: Noncontrast imaging demonstrates mild diffuse white matter
disease. The punctate white matter infarct is not visualized by CT.
No new or acute cortical infarcts are present. The basal ganglia are
intact. A remote lacunar infarct of the right cerebellum is again
noted.

Vascular: Atherosclerotic calcifications are present within the
cavernous internal carotid arteries bilaterally. There is no
hyperdense vessel.

Skull: Calvarium is intact. No focal lytic or blastic lesions are
present. Hyperostosis frontalis internus is noted diffusely.

Sinuses: The paranasal sinuses and mastoid air cells are clear.

Orbits: Bilateral lens replacements are present

Globes and orbits are otherwise within normal limits.

Review of the MIP images confirms the above findings

CTA NECK FINDINGS

Aortic arch: A 3 vessel arch configuration is present.
Atherosclerotic calcifications are present at the aorta without
focal stenosis or aneurysm.

Right carotid system: There is medial deviation of the right common
carotid artery. Atherosclerotic calcifications are present at the
bifurcation without significant focal stenosis. There is tortuosity
of the cervical right ICA without significant stenosis.

Left carotid system: The left common carotid artery is within normal
limits. Atherosclerotic calcifications are present at the left
carotid bifurcation. There is no significant luminal stenosis
relative to the more distal vessel. The cervical left ICA is
otherwise normal.

Vertebral arteries: The right vertebral artery is the dominant
vessel. Both vertebral arteries originate from the subclavian
arteries without focal stenosis. There is no significant stenosis of
either vertebral artery in the neck.

Skeleton: There is straightening of the normal cervical lordosis.
Mild endplate changes are most evident at C5-6. No focal lytic or
blastic lesions are present. The patient is edentulous.

Other neck: The soft tissues the neck are otherwise unremarkable. No
focal mucosal or submucosal lesion is present. The salivary glands
are within normal limits. No significant adenopathy is present.

Upper chest: Lung apices are clear. Thoracic inlet is within normal
limits.

Review of the MIP images confirms the above findings

CTA HEAD FINDINGS

Anterior circulation: Atherosclerotic calcifications are present
within the cavernous internal carotid arteries bilaterally. There is
moderate to severe stenosis of the left ophthalmic segment. There is
mild narrowing of the proximal left M1 segment and high-grade
stenosis of the proximal left A1 segment. Right A1 and M1 segments
are normal. The anterior communicating artery is patent. There is
asymmetric attenuation MCA branch vessels on the right. Superior
division stenosis ease are more severe. ACA branch vessels are
within normal limits.

Posterior circulation: The right vertebral artery is the dominant
vessel. The PICA origins are visualized and normal bilaterally. The
basilar artery is normal. Both posterior cerebral arteries originate
from the basilar tip. There is a high-grade stenosis of the proximal
left P1 segment. There is asymmetric attenuation of PCA branch
vessels on the left.

Venous sinuses: The dural sinuses are patent. Straight sinus and
deep cerebral veins are intact. Cortical veins are unremarkable.

Anatomic variants: None

Delayed phase: Postcontrast images accentuate the white matter
disease. No pathologic enhancement is present.

Review of the MIP images confirms the above findings
IMPRESSION: 1. Moderate to severe stenosis of the left ophthalmic segment
relative to the more distal left ICA terminus.
2. High-grade stenosis of the proximal left A1 segment with patent
anterior communicating artery and normal more distal ACA branch
vessels.
3. Mild narrowing of the proximal left M1 segment.
4. Asymmetric attenuation of left MCA branch vessels compared to the
right.
5. High-grade stenosis of the proximal left P1 segment.
6. Atherosclerotic changes involving the aortic arch and bilateral
carotid bifurcations without significant stenosis in the neck.

## 2019-05-28 MED FILL — JAKAFI 25 MG TABLET: 25 | 30 days supply | Qty: 60 | Fill #4

## 2019-06-10 DIAGNOSIS — S8291XA Unspecified fracture of right lower leg, initial encounter for closed fracture: Secondary | ICD-10-CM | POA: Diagnosis not present

## 2019-06-10 DIAGNOSIS — R531 Weakness: Secondary | ICD-10-CM | POA: Diagnosis not present

## 2019-06-15 ENCOUNTER — Other Ambulatory Visit: Payer: Self-pay | Admitting: Cardiovascular Disease

## 2019-07-04 ENCOUNTER — Other Ambulatory Visit (INDEPENDENT_AMBULATORY_CARE_PROVIDER_SITE_OTHER): Payer: PPO

## 2019-07-04 ENCOUNTER — Encounter: Payer: Self-pay | Admitting: Gastroenterology

## 2019-07-04 ENCOUNTER — Ambulatory Visit: Payer: PPO | Admitting: Gastroenterology

## 2019-07-04 VITALS — BP 142/70 | HR 104 | Temp 98.3°F | Ht 63.0 in | Wt 225.2 lb

## 2019-07-04 DIAGNOSIS — K7460 Unspecified cirrhosis of liver: Secondary | ICD-10-CM | POA: Diagnosis not present

## 2019-07-04 LAB — BASIC METABOLIC PANEL
BUN: 19 mg/dL (ref 6–23)
CO2: 25 mEq/L (ref 19–32)
Calcium: 10 mg/dL (ref 8.4–10.5)
Chloride: 102 mEq/L (ref 96–112)
Creatinine, Ser: 1.61 mg/dL — ABNORMAL HIGH (ref 0.40–1.20)
GFR: 30.8 mL/min — ABNORMAL LOW (ref >60.00)
Glucose, Bld: 139 mg/dL — ABNORMAL HIGH (ref 70–99)
Potassium: 4.1 mEq/L (ref 3.5–5.1)
Sodium: 137 mEq/L (ref 135–145)

## 2019-07-04 NOTE — Patient Instructions (Addendum)
If you are age 80 or older, your body mass index should be between 23-30. Your Body mass index is 39.9 kg/m. If this is out of the aforementioned range listed, please consider follow up with your Primary Care Provider.  Your provider has requested that you go to the basement level for lab work before leaving today. Press "B" on the elevator. The lab is located at the first door on the left as you exit the elevator.   Please contact our office and provide the correct dose for your spironolactone and Lasix. Ask to speak with [name]  Return office visit in 3 months  Thank you for entrusting me with your care and choosing [facility].  Dr [name]

## 2019-07-04 NOTE — Progress Notes (Signed)
Review of pertinent gastrointestinal problems: 1. 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  04/2019 [place] cirrhosis without focal lesions  AFP 08/2014 normal... 01/2018 4.7 (normal)... 04/2019 3.5 (normal)  EGD 02/2014 no portal hypertensive changes; + irregular z line, biopsies showed no IM:EGD 05/2018, Dr. Lyndel Safe while hospitalized, gastritis, mild duodenitis. No portal hypertensive signs.  Immunized for hep a/b 2015  Ascites managedwith relatively  aldactone, lasix;  Fluid testing 05/2018 SAAG elevated, no SBP  MELD score 13 (04/2019 labs) 2. C. Diff colitis:diarrhea following abx for buttocks abscess 2015; C. Diff PCR +, C. Diff toxin A/B positive; treated with flagyl, vanc   HPI: This is a very pleasant 78 year old woman whom I last saw about 2 and half months ago here in the office.  Her weight is up 1 pound since her last office visit here 3 months ago.  She is doing overall well however she believes the swelling in her ankles is a bit worse than her usual.  As is chronically the case for her the left ankle swelling and foot swelling is more significant than the swelling on the right.  No fevers or chills, no overt GI bleeding, no obvious encephalopathic episodes  Chief complaint is cirrhosis  ROS: complete GI ROS as described in HPI, all other review negative.  Constitutional:  No unintentional weight loss   Past Medical History:  Diagnosis Date  . Allergic rhinitis 01/23/2016  . C. difficile colitis   . CAD (coronary artery disease)    a. s/p STEMI in 01/2015 with 95% LCx stenosis and distal 80% LCx stenosis (DESx2  placed)  . Cancer ([place])    SKIN  . Candidiasis of skin 09/30/2014  . Cirrhosis ([place])   . Depression   . Diabetes mellitus 2008  . Gout   . Herpes simplex   . Hyperglycemia 05/31/2013  . Hyperlipidemia   . Hypertension   . Myocardial infarction ([place])   . 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 Obesity   . Polycythemia    Dr. Elease Hashimoto- [facility]  . Psoriasis     Past Surgical History:  Procedure Laterality Date  . ANKLE FRACTURE SURGERY    . BIOPSY  05/26/2018   Procedure: BIOPSY;  Surgeon: [name], MD;  Location: [facility] ENDOSCOPY;  Service: Endoscopy;;  . BREAST SURGERY Left    milk duct  . CATARACT EXTRACTION, BILATERAL    . COLONOSCOPY    . ESOPHAGOGASTRODUODENOSCOPY N/A 05/26/2018   Procedure: ESOPHAGOGASTRODUODENOSCOPY (EGD);  Surgeon: [name], MD;  Location: [facility] ENDOSCOPY;  Service: Endoscopy;  Laterality: N/A;  . IR PARACENTESIS  05/25/2018  . LEFT HEART CATHETERIZATION WITH CORONARY ANGIOGRAM N/A 01/27/2015   Procedure: LEFT HEART CATHETERIZATION WITH CORONARY ANGIOGRAM;  Surgeon: [name], MD;  Location: [facility] CATH LAB;  Service: Cardiovascular;  Laterality: N/A;  . TONSILLECTOMY    . TOTAL ABDOMINAL HYSTERECTOMY W/ BILATERAL SALPINGOOPHORECTOMY     for heavy periods with appendectomy    Current Outpatient Medications  Medication Sig Dispense Refill  . acetaminophen (TYLENOL) 325 MG tablet Take 2 tablets (650 mg total) by mouth every 6 (six) hours as needed for mild pain (or Fever >/= 101).    Marland Kitchen  amLODipine (NORVASC) 5 MG tablet Take 1 tablet (5 mg total) by mouth daily. 90 tablet 3  . Blood Glucose Monitoring Suppl (ACCU-CHEK GUIDE) w/Device KIT 1 kit by Does not apply route as directed. 1 kit 0  . clopidogrel (PLAVIX) 75 MG tablet TAKE 1 TABLET(75 MG) BY MOUTH DAILY 90 tablet 3  . diphenoxylate-atropine (LOMOTIL) 2.5-0.025 MG tablet Take 1 tablet by mouth twice daily as needed for diarrhea. 50 tablet 2  . Dulaglutide (TRULICITY) 1.5 PJ/8.2NK SOPN  Inject 1.5 mg into the skin once a week. 4 pen 1  . ezetimibe (ZETIA) 10 MG tablet TAKE 1 TABLET(10 MG) BY MOUTH DAILY 90 tablet 0  . fluticasone (FLONASE) 50 MCG/ACT nasal spray Place 1 spray into both nostrils 2 (two) times daily. 16 g 3  . furosemide (LASIX) 20 MG tablet Take (3)  20 mg tabs to equal 60 mg daily (Patient taking differently: 40 mg daily. ) 90 tablet 6  . glucose blood (ACCU-CHEK GUIDE) test strip Use to check blood sugars 3 times daily 100 each 12  . Insulin Pen Needle (BD PEN NEEDLE NANO U/F) 32G X 4 MM MISC USE AS DIRECTED TO INJECT INSULIN 100 each 11  . insulin regular human CONCENTRATED (HUMULIN R U-500 KWIKPEN) 500 UNIT/ML kwikpen Inject 90 units in am and 80 units before dinner, under skin 6 pen 3  . JAKAFI 25 MG tablet TAKE 1 TABLET (25 MG TOTAL) BY MOUTH 2 (TWO) TIMES DAILY. 60 tablet 6  . latanoprost (XALATAN) 0.005 % ophthalmic solution at bedtime.    Marland Kitchen levothyroxine (SYNTHROID) 25 MCG tablet Take 1 tablet (25 mcg total) by mouth daily before breakfast. 30 tablet 0  . loperamide (IMODIUM A-D) 2 MG tablet Take 1-2 tablets (2-4 mg total) by mouth daily as needed for diarrhea or loose stools. 30 tablet 1  . losartan (COZAAR) 100 MG tablet Take 100 mg by mouth daily.  0  . mirtazapine (REMERON) 7.5 MG tablet TAKE 1 TABLET(7.5 MG) BY MOUTH AT BEDTIME 90 tablet 0  . omeprazole (PRILOSEC) 20 MG capsule Take 1 capsule (20 mg total) by mouth daily. 30 capsule 0  . ondansetron (ZOFRAN ODT) 4 MG disintegrating tablet Take 1 tablet (4 mg total) by mouth every 8 (eight) hours as needed. 10 tablet 0  . ONETOUCH VERIO test strip USE TO TEST BLOOD SUGAR THREE TIMES DAILY 300 each 2  . oxyCODONE-acetaminophen (PERCOCET/ROXICET) 5-325 MG tablet Take 1-2 tablets by mouth every 8 (eight) hours as needed for severe pain. 15 tablet 0  . potassium chloride (KLOR-CON 10) 10 MEQ tablet Take 1 tablet (10 mEq total) by mouth daily. 30 tablet 6  . promethazine (PHENERGAN) 25 MG tablet Take 0.5  tablets (12.5 mg total) by mouth every 6 (six) hours as needed for nausea or vomiting. 30 tablet 1  . spironolactone (ALDACTONE) 100 MG tablet Take 2 tablets daily. (Patient taking differently: 100 mg daily. ) 60 tablet 6  . triamcinolone ointment (KENALOG) 0.5 % Apply 1 application topically 2 (two) times daily. 30 g 0   No current facility-administered medications for this visit.     Allergies as of 07/04/2019 - Review Complete 07/04/2019  Allergen Reaction Noted  . Penicillins Swelling 05/23/2018  . Lisinopril Cough 04/08/2015  . Tape Hives 01/28/2014  . Doxycycline Hives, Swelling, and Rash   . Latex Hives, Itching, and Rash 09/07/2011    Family History  Problem Relation Age of Onset  . Diabetes Mother   . Heart disease Mother  CAD  . Hyperlipidemia Mother   . Hypertension Mother   . Kidney disease Mother   . Kidney disease Father   . Breast cancer Maternal Aunt   . Heart disease Maternal Grandmother   . Heart disease Other        maternal aunts and uncles  . Colon cancer Neg Hx   . Esophageal cancer Neg Hx     Social History   Socioeconomic History  . Marital status: Married    Spouse name: [name]  . Number of children: 2  . Years of education: 2 yr colle  . Highest education level: Not on file  Occupational History  . Occupation: housewife  Social Needs  . Financial resource strain: Not on file  . Food insecurity    Worry: Not on file    Inability: Not on file  . Transportation needs    Medical: Not on file    Non-medical: Not on file  Tobacco Use  . Smoking status: Former Smoker    Years: 48.00    Types: Cigarettes    Quit date: 10/18/1986    Years since quitting: 32.7  . Smokeless tobacco: Never Used  Substance and Sexual Activity  . Alcohol use: No    Alcohol/week: 0.0 standard drinks  . Drug use: No  . Sexual activity: Yes    Partners: Male    Birth control/protection: None  Lifestyle  . Physical activity    Days per week: Not on file     Minutes per session: Not on file  . Stress: Not on file  Relationships  . Social Herbalist on phone: Not on file    Gets together: Not on file    Attends religious service: Not on file    Active member of club or organization: Not on file    Attends meetings of clubs or organizations: Not on file    Relationship status: Not on file  . Intimate partner violence    Fear of current or ex partner: Not on file    Emotionally abused: Not on file    Physically abused: Not on file    Forced sexual activity: Not on file  Other Topics Concern  . Not on file  Social History Narrative   2 caffeine drink per day.  No regular exercise.     Retired from the bank   2 children (Daughter and a son) son has morbid obesity   2 grandchildren   3 great grandchildren     Physical Exam: BP (!) 142/70 (BP Location: Left Arm, Patient Position: Sitting, Cuff Size: Normal)   Pulse (!) 104   Temp 98.3 F (36.8 C)   Ht 5' 3"  (1.6 m)   Wt 225 lb 4 oz (102.2 kg)   BMI 39.90 kg/m  Constitutional: generally well-appearing Psychiatric: alert and oriented x3 Abdomen: soft, nontender, nondistended, no obvious ascites, no peritoneal signs, normal bowel sounds 1+ pitting edema in the right ankle, 2+ pitting edema in the left ankle  Assessment and plan: 78 y.o. female with decompensated cirrhosis  Her biggest issue is fluid control.  I am going to have her husband verify the dosage of her Aldactone and furosemide when he gets home.  I believe she is taking Aldactone 100 mg once daily and Lasix 60 mg once daily.  She is bit more bothered with the swelling in the her left ankle than in her right ankle and that has been always the case for her.  She will get a be met tested today and if safe I will increase her diuretics a bit.    She is up-to-date on hepatoma screening, up-to-date on variceal screening.  She knows to avoid alcohol and excessive salt intakes.  She will return to see me in 3 months and  sooner if needed.  Please see the "Patient Instructions" section for addition details about the plan.  Owens Loffler, MD [facility] 07/04/2019, 10:54 AM

## 2019-07-05 ENCOUNTER — Other Ambulatory Visit: Payer: Self-pay | Admitting: Gastroenterology

## 2019-07-05 DIAGNOSIS — K7460 Unspecified cirrhosis of liver: Secondary | ICD-10-CM

## 2019-07-05 MED ORDER — SPIRONOLACTONE 100 MG PO TABS: 100.0000 mg | ORAL_TABLET | Freq: Every day | ORAL | 6 refills | Status: DC

## 2019-07-05 MED ORDER — SPIRONOLACTONE 50 MG PO TABS: 50.0000 mg | ORAL_TABLET | Freq: Every day | ORAL | 6 refills | Status: DC

## 2019-07-05 MED ORDER — FUROSEMIDE 20 MG PO TABS: 20.0000 mg | ORAL_TABLET | Freq: Every day | ORAL | 6 refills | Status: DC

## 2019-07-05 MED FILL — JAKAFI 25 MG TABLET: 25 | 30 days supply | Qty: 60 | Fill #5

## 2019-07-09 ENCOUNTER — Other Ambulatory Visit: Payer: Self-pay

## 2019-07-09 ENCOUNTER — Inpatient Hospital Stay: Payer: PPO | Attending: Family | Admitting: Hematology & Oncology

## 2019-07-09 ENCOUNTER — Inpatient Hospital Stay: Payer: PPO

## 2019-07-09 ENCOUNTER — Encounter: Payer: Self-pay | Admitting: Hematology & Oncology

## 2019-07-09 ENCOUNTER — Telehealth: Payer: Self-pay | Admitting: Hematology & Oncology

## 2019-07-09 VITALS — BP 156/62 | HR 94 | Temp 96.9°F | Resp 19 | Wt 224.0 lb

## 2019-07-09 DIAGNOSIS — E119 Type 2 diabetes mellitus without complications: Secondary | ICD-10-CM | POA: Insufficient documentation

## 2019-07-09 DIAGNOSIS — D45 Polycythemia vera: Secondary | ICD-10-CM

## 2019-07-09 LAB — CBC WITH DIFFERENTIAL (CANCER CENTER ONLY)
Abs Immature Granulocytes: 2.18 10*3/uL — ABNORMAL HIGH (ref 0.00–0.07)
Basophils Absolute: 0.2 10*3/uL — ABNORMAL HIGH (ref 0.0–0.1)
Basophils Relative: 1 %
Eosinophils Absolute: 0 10*3/uL (ref 0.0–0.5)
Eosinophils Relative: 0 %
HCT: 38.7 % (ref 36.0–46.0)
Hemoglobin: 12.6 g/dL (ref 12.0–15.0)
Immature Granulocytes: 9 %
Lymphocytes Relative: 16 %
Lymphs Abs: 3.9 10*3/uL (ref 0.7–4.0)
MCH: 29.7 pg (ref 26.0–34.0)
MCHC: 32.6 g/dL (ref 30.0–36.0)
MCV: 91.3 fL (ref 80.0–100.0)
Monocytes Absolute: 1.5 10*3/uL — ABNORMAL HIGH (ref 0.1–1.0)
Monocytes Relative: 6 %
Neutro Abs: 16.9 10*3/uL — ABNORMAL HIGH (ref 1.7–7.7)
Neutrophils Relative %: 68 %
Platelet Count: 364 10*3/uL (ref 150–400)
RBC: 4.24 MIL/uL (ref 3.87–5.11)
RDW: 17.3 % — ABNORMAL HIGH (ref 11.5–15.5)
WBC Count: 24.6 10*3/uL — ABNORMAL HIGH (ref 4.0–10.5)
nRBC: 0.2 % (ref 0.0–0.2)

## 2019-07-09 LAB — CMP (CANCER CENTER ONLY)
ALT: 11 U/L (ref 0–44)
AST: 26 U/L (ref 15–41)
Albumin: 4.4 g/dL (ref 3.5–5.0)
Alkaline Phosphatase: 77 U/L (ref 38–126)
Anion gap: 10 (ref 5–15)
BUN: 20 mg/dL (ref 8–23)
CO2: 25 mmol/L (ref 22–32)
Calcium: 9.5 mg/dL (ref 8.9–10.3)
Chloride: 103 mmol/L (ref 98–111)
Creatinine: 1.78 mg/dL — ABNORMAL HIGH (ref 0.44–1.00)
GFR, Est AFR Am: 31 mL/min — ABNORMAL LOW (ref >60)
GFR, Estimated: 27 mL/min — ABNORMAL LOW (ref >60)
Glucose, Bld: 143 mg/dL — ABNORMAL HIGH (ref 70–99)
Potassium: 3.8 mmol/L (ref 3.5–5.1)
Sodium: 138 mmol/L (ref 135–145)
Total Bilirubin: 1.1 mg/dL (ref 0.3–1.2)
Total Protein: 7.4 g/dL (ref 6.5–8.1)

## 2019-07-09 LAB — SAVE SMEAR(SSMR), FOR PROVIDER SLIDE REVIEW

## 2019-07-09 LAB — FERRITIN: Ferritin: 219 ng/mL (ref 11–307)

## 2019-07-09 LAB — IRON AND TIBC
Iron: 104 ug/dL (ref 41–142)
Saturation Ratios: 33 % (ref 21–57)
TIBC: 316 ug/dL (ref 236–444)
UIBC: 213 ug/dL (ref 120–384)

## 2019-07-09 NOTE — Telephone Encounter (Signed)
lmom to inform patient of 11/16 appt at 0930 per 9/21 LOS

## 2019-07-09 NOTE — Progress Notes (Signed)
Hematology and Oncology Follow Up Visit  Amanda [name] [id number] Feb 24, 1941 78 y.o. 07/09/2019   Principle Diagnosis:  [name] - JAK2 (+)  Current Therapy:   Jakafi 25 mg po BID   Interim History:  Amanda [name] is here today for follow-up.  Amanda [name] really is quite concerned about the corona virus.  Amanda [name] is not going anywhere.  Amanda [name] is not have anybody come over to Amanda [name] house.  Amanda [name] is afraid that if Amanda [name] catches the coronavirus, that Amanda [name] would not survive it.  It is the high holy holidays for the Jewish people.  Amanda [name] has been busy with going to service at the [employer].  Amanda [name] is not made any special meals for the holidays.  This probably first time that Amanda [name] has not done this.  As far as Amanda [name] blood condition is concerned, Amanda [name] is doing quite well.  Amanda [name] is on the [place].  Amanda [name] has had no problems with Jakafi.  There is been no fever.  Amanda [name] has had no bleeding.  Amanda [name] diabetes seems to be doing pretty well right now.  Overall, Amanda [name] performance status is ECOG 2.  Medications:  Allergies as of 07/09/2019      Reactions   Penicillins Swelling   Has patient had a PCN reaction causing immediate rash, facial/tongue/throat swelling, SOB or lightheadedness with hypotension: No Has patient had a PCN reaction causing severe rash involving mucus membranes or skin necrosis: No Has patient had a PCN reaction that required hospitalization: No Has patient had a PCN reaction occurring within the last 10 years: No If all of the above answers are "NO", then may proceed with Cephalosporin use.   Lisinopril Cough   Tape Hives   Doxycycline Hives, Swelling, Rash   Latex Hives, Itching, Rash      Medication List       Accurate as of July 09, 2019 11:06 AM. If you have any questions, ask your nurse or doctor.        Accu-Chek Guide w/Device Kit 1 kit by Does not apply route as directed.   acetaminophen 325 MG tablet Commonly known as: TYLENOL Take 2 tablets (650 mg total) by mouth every 6  (six) hours as needed for mild pain (or Fever >/= 101).   amLODipine 5 MG tablet Commonly known as: NORVASC Take 1 tablet (5 mg total) by mouth daily.   BD Pen Needle Nano U/F 32G X 4 MM Misc Generic drug: Insulin Pen Needle USE AS DIRECTED TO INJECT INSULIN   clopidogrel 75 MG tablet Commonly known as: PLAVIX TAKE 1 TABLET(75 MG) BY MOUTH DAILY   diphenoxylate-atropine 2.5-0.025 MG tablet Commonly known as: LOMOTIL Take 1 tablet by mouth twice daily as needed for diarrhea.   Dulaglutide 1.5 MG/0.5ML Sopn Commonly known as: Trulicity Inject 1.5 mg into the skin once a week.   ezetimibe 10 MG tablet Commonly known as: ZETIA TAKE 1 TABLET(10 MG) BY MOUTH DAILY   fluticasone 50 MCG/ACT nasal spray Commonly known as: Flonase Place 1 spray into both nostrils 2 (two) times daily.   furosemide 20 MG tablet Commonly known as: Lasix Take 1 tablet (20 mg total) by mouth daily. Take 3 tabs daily to equal 60 mg What changed: Another medication with the same name was removed. Continue taking this medication, and follow the directions you see here. Changed by: Volanda Napoleon, MD   glucose blood test strip Commonly known as: Accu-Chek Guide Use to check blood sugars 3 times daily   OneTouch Verio  test strip Generic drug: glucose blood USE TO TEST BLOOD SUGAR THREE TIMES DAILY   insulin regular human CONCENTRATED 500 UNIT/ML kwikpen Commonly known as: HumuLIN R U-500 KwikPen Inject 90 units in am and 80 units before dinner, under skin   Jakafi 25 MG tablet Generic drug: ruxolitinib phosphate TAKE 1 TABLET (25 MG TOTAL) BY MOUTH 2 (TWO) TIMES DAILY.   latanoprost 0.005 % ophthalmic solution Commonly known as: XALATAN at bedtime.   levothyroxine 25 MCG tablet Commonly known as: Synthroid Take 1 tablet (25 mcg total) by mouth daily before breakfast.   loperamide 2 MG tablet Commonly known as: IMODIUM A-D Take 1-2 tablets (2-4 mg total) by mouth daily as needed for diarrhea  or loose stools.   losartan 100 MG tablet Commonly known as: COZAAR Take 100 mg by mouth daily.   mirtazapine 7.5 MG tablet Commonly known as: REMERON TAKE 1 TABLET(7.5 MG) BY MOUTH AT BEDTIME   omeprazole 20 MG capsule Commonly known as: PRILOSEC Take 1 capsule (20 mg total) by mouth daily.   ondansetron 4 MG disintegrating tablet Commonly known as: Zofran ODT Take 1 tablet (4 mg total) by mouth every 8 (eight) hours as needed.   oxyCODONE-acetaminophen 5-325 MG tablet Commonly known as: PERCOCET/ROXICET Take 1-2 tablets by mouth every 8 (eight) hours as needed for severe pain.   potassium chloride 10 MEQ tablet Commonly known as: Klor-Con 10 Take 1 tablet (10 mEq total) by mouth daily.   promethazine 25 MG tablet Commonly known as: PHENERGAN Take 0.5 tablets (12.5 mg total) by mouth every 6 (six) hours as needed for nausea or vomiting.   spironolactone 100 MG tablet Commonly known as: ALDACTONE Take 2 tablets daily. What changed:   how much to take  when to take this  additional instructions   spironolactone 50 MG tablet Commonly known as: Aldactone Take 1 tablet (50 mg total) by mouth daily. Take one tablet daily along with 160m Aldactone to equal 150 mg daily What changed: Another medication with the same name was changed. Make sure you understand how and when to take each.   spironolactone 100 MG tablet Commonly known as: Aldactone Take 1 tablet (100 mg total) by mouth daily. Take with 50 mg Aldactone to equal 150 mg daily What changed: Another medication with the same name was changed. Make sure you understand how and when to take each.   triamcinolone ointment 0.5 % Commonly known as: KENALOG Apply 1 application topically 2 (two) times daily.       Allergies:  Allergies  Allergen Reactions  . Penicillins Swelling    Has patient had a PCN reaction causing immediate rash, facial/tongue/throat swelling, SOB or lightheadedness with hypotension: No  Has patient had a PCN reaction causing severe rash involving mucus membranes or skin necrosis: No Has patient had a PCN reaction that required hospitalization: No Has patient had a PCN reaction occurring within the last 10 years: No If all of the above answers are "NO", then may proceed with Cephalosporin use.  .Marland KitchenLisinopril Cough  . Tape Hives  . Doxycycline Hives, Swelling and Rash  . Latex Hives, Itching and Rash    Past Medical History, Surgical history, Social history, and Family History were reviewed and updated.  Review of Systems: Review of Systems  Constitutional: Negative.   HENT: Negative.   Eyes: Negative.   Respiratory: Negative.   Cardiovascular: Negative.   Gastrointestinal: Negative.   Genitourinary: Negative.   Musculoskeletal: Negative.   Skin: Negative.  Neurological: Negative.   Endo/Heme/Allergies: Negative.   Psychiatric/Behavioral: Negative.    Marland Kitchen   Physical Exam:  weight is 224 lb (101.6 kg). Amanda [name] temporal temperature is 96.9 F (36.1 C) (abnormal). Amanda [name] blood pressure is 156/62 (abnormal) and Amanda [name] pulse is 94. Amanda [name] respiration is 19 and oxygen saturation is 100%.   Wt Readings from Last 3 Encounters:  07/09/19 224 lb (101.6 kg)  07/04/19 225 lb 4 oz (102.2 kg)  04/25/19 224 lb (101.6 kg)    Physical Exam Vitals signs reviewed.  HENT:     Head: Normocephalic and atraumatic.  Eyes:     Pupils: Pupils are equal, round, and reactive to light.  Neck:     Musculoskeletal: Normal range of motion.  Cardiovascular:     Rate and Rhythm: Normal rate and regular rhythm.     Heart sounds: Normal heart sounds.  Pulmonary:     Effort: Pulmonary effort is normal.     Breath sounds: Normal breath sounds.  Abdominal:     General: Bowel sounds are normal.     Palpations: Abdomen is soft.  Musculoskeletal: Normal range of motion.        General: No tenderness or deformity.  Lymphadenopathy:     Cervical: No cervical adenopathy.  Skin:    General: Skin is  warm and dry.     Findings: No erythema or rash.  Neurological:     Mental Status: Amanda [name] is alert and oriented to person, place, and time.  Psychiatric:        Behavior: Behavior normal.        Thought Content: Thought content normal.        Judgment: Judgment normal.      Lab Results  Component Value Date   WBC 24.6 (H) 07/09/2019   HGB 12.6 07/09/2019   HCT 38.7 07/09/2019   MCV 91.3 07/09/2019   PLT 364 07/09/2019   Lab Results  Component Value Date   FERRITIN 202 04/25/2019   IRON 97 04/25/2019   TIBC 351 04/25/2019   UIBC 255 04/25/2019   IRONPCTSAT 28 04/25/2019   Lab Results  Component Value Date   RETICCTPCT 1.8 04/25/2019   RBC 4.24 07/09/2019   No results found for: Nils Pyle [name] Lab Results  Component Value Date   IGA 233 03/29/2014   No results found for: Kathrynn Ducking, MSPIKE, SPEI   Chemistry      Component Value Date/Time   NA 138 07/09/2019 0932   NA 140 10/12/2017 1013   NA 138 01/21/2017 0806   K 3.8 07/09/2019 0932   K 3.7 10/12/2017 1013   K 4.1 01/21/2017 0806   CL 103 07/09/2019 0932   CL 103 10/12/2017 1013   CL 103 02/16/2013 0900   CO2 25 07/09/2019 0932   CO2 25 10/12/2017 1013   CO2 21 (L) 01/21/2017 0806   BUN 20 07/09/2019 0932   BUN 10 10/12/2017 1013   BUN 12.2 01/21/2017 0806   CREATININE 1.78 (H) 07/09/2019 0932   CREATININE 0.9 10/12/2017 1013   CREATININE 1.0 01/21/2017 0806      Component Value Date/Time   CALCIUM 9.5 07/09/2019 0932   CALCIUM 8.7 10/12/2017 1013   CALCIUM 9.3 01/21/2017 0806   ALKPHOS 77 07/09/2019 0932   ALKPHOS 97 (H) 10/12/2017 1013   ALKPHOS 112 01/21/2017 0806   AST 26 07/09/2019 0932   AST 37 (H) 01/21/2017 0806   ALT 11 07/09/2019 0932   ALT  25 10/12/2017 1013   ALT 16 01/21/2017 0806   BILITOT 1.1 07/09/2019 0932   BILITOT 0.89 01/21/2017 0806       Impression and Plan: Amanda [name] is a very pleasant 78 yo  caucasian female with polycythemia vera, JAK-2 positive.   I am so glad that the [place] is doing so well for Amanda [name].  Amanda [name] blood counts are being controlled and are holding steady.  With Amanda [name], Amanda [name] prognosis is based upon Amanda [name] diabetes.  Blood sugar is doing much better.  I think we get Amanda [name] back in November.  I want to see Amanda [name] before the holidays commands.  Hopefully, the coronavirus will not be as prevalent as Amanda [name] will be able to go and visit family and have family come over.    Volanda Napoleon, MD 9/21/202011:06 AM

## 2019-07-11 DIAGNOSIS — R531 Weakness: Secondary | ICD-10-CM | POA: Diagnosis not present

## 2019-07-11 DIAGNOSIS — S8291XA Unspecified fracture of right lower leg, initial encounter for closed fracture: Secondary | ICD-10-CM | POA: Diagnosis not present

## 2019-07-14 ENCOUNTER — Other Ambulatory Visit: Payer: Self-pay | Admitting: Cardiovascular Disease

## 2019-07-16 IMAGING — DX DG CHEST 1V PORT
1 series · 1 of 1 positions shown · non-contrast
Comparison: Radiographs and CT scan March 23, 2018.

CLINICAL DATA: Shortness of breath.

EXAM:
PORTABLE CHEST 1 VIEW

[chest ap]
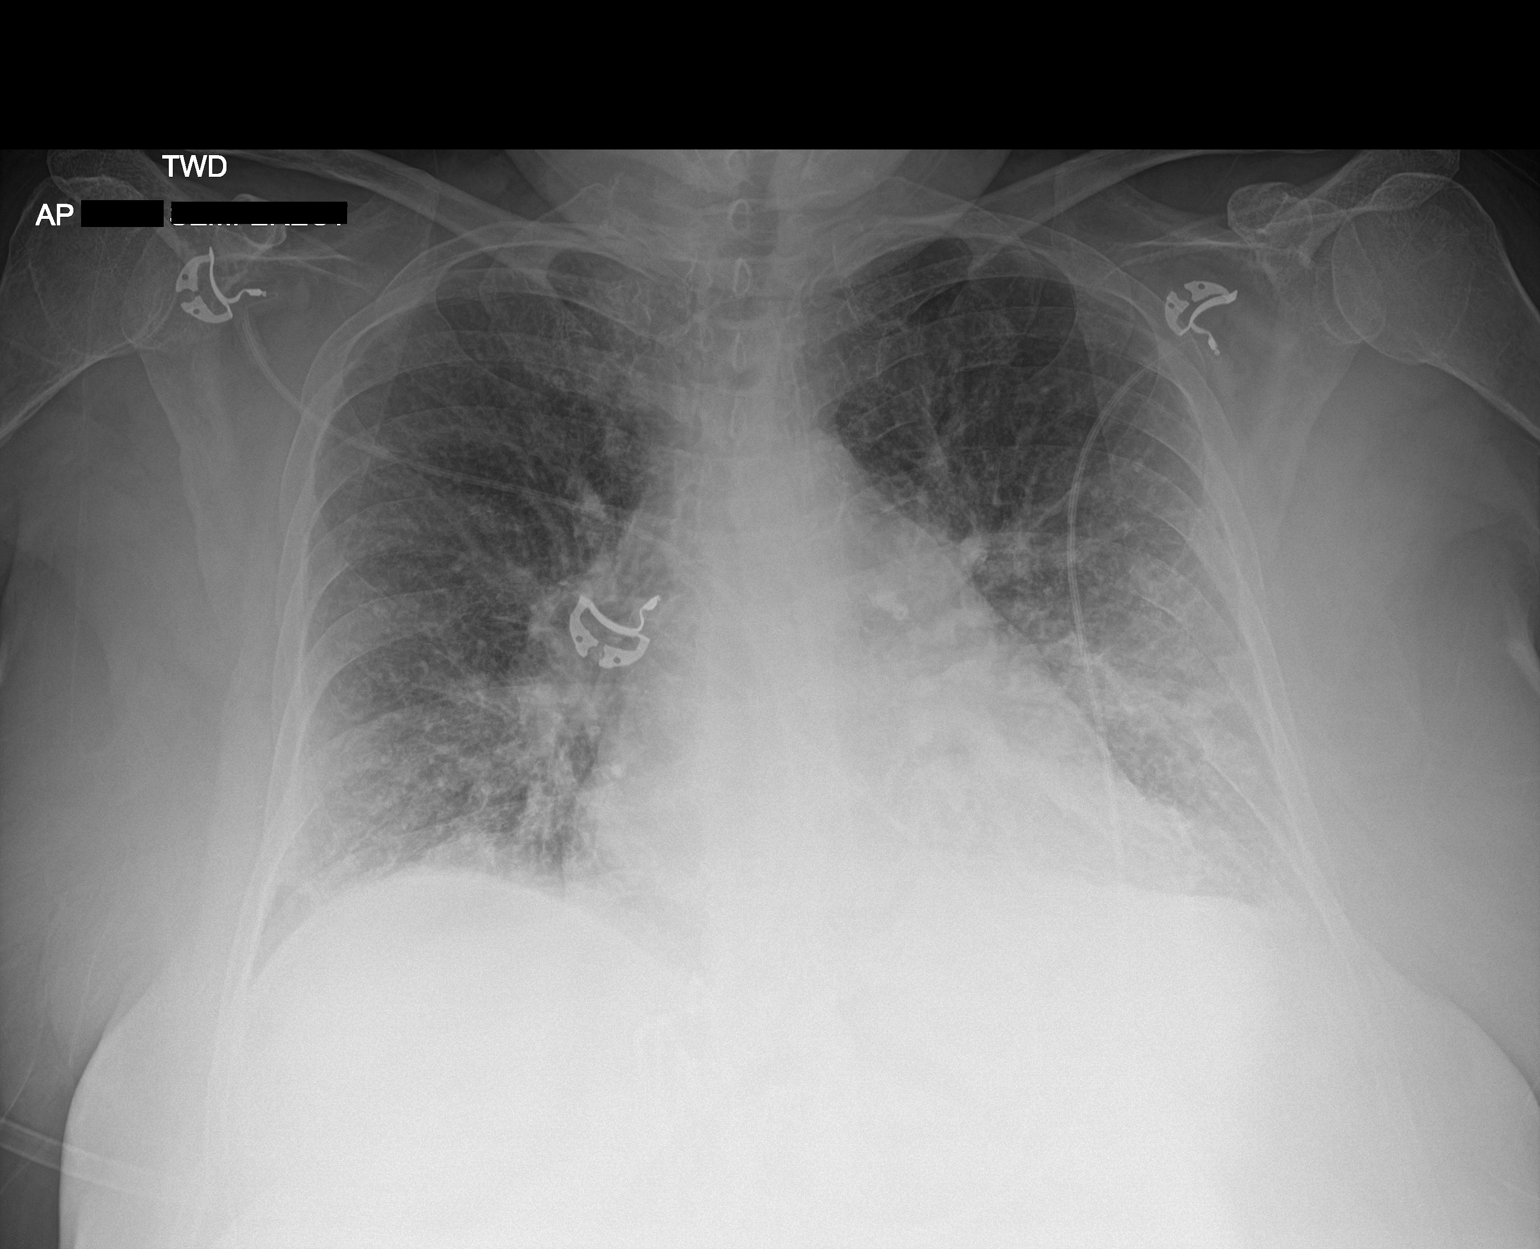

[1 of 1 positions shown; findings below may reference images not displayed]

FINDINGS: Stable cardiomegaly with central pulmonary vascular congestion is
noted. Mildly increased interstitial densities are noted bilaterally
suggesting possible pulmonary edema. No pneumothorax or significant
pleural effusion is noted. Bony thorax is unremarkable.
IMPRESSION: Stable cardiomegaly with central pulmonary vascular congestion and
probable mild bilateral pulmonary edema.

## 2019-07-19 ENCOUNTER — Telehealth: Payer: Self-pay | Admitting: Adult Health

## 2019-07-19 NOTE — Telephone Encounter (Signed)
Spouse would like to speak with PCP regarding patient medications, stating she cant take tablets its making her vomit, spouse wants to know if all medications are necessary, please advise

## 2019-07-19 NOTE — Telephone Encounter (Signed)
Pt now scheduled for 07/20/2019 at 3:30.  Nothing further needed.

## 2019-07-19 NOTE — Telephone Encounter (Signed)
Left a message for a return call.

## 2019-07-20 ENCOUNTER — Telehealth (INDEPENDENT_AMBULATORY_CARE_PROVIDER_SITE_OTHER): Payer: PPO | Admitting: Adult Health

## 2019-07-20 ENCOUNTER — Other Ambulatory Visit (INDEPENDENT_AMBULATORY_CARE_PROVIDER_SITE_OTHER): Payer: PPO

## 2019-07-20 ENCOUNTER — Other Ambulatory Visit: Payer: Self-pay

## 2019-07-20 DIAGNOSIS — M2041 Other hammer toe(s) (acquired), right foot: Secondary | ICD-10-CM | POA: Diagnosis not present

## 2019-07-20 DIAGNOSIS — M21961 Unspecified acquired deformity of right lower leg: Secondary | ICD-10-CM | POA: Diagnosis not present

## 2019-07-20 DIAGNOSIS — Z79899 Other long term (current) drug therapy: Secondary | ICD-10-CM | POA: Diagnosis not present

## 2019-07-20 DIAGNOSIS — L602 Onychogryphosis: Secondary | ICD-10-CM | POA: Diagnosis not present

## 2019-07-20 DIAGNOSIS — M2042 Other hammer toe(s) (acquired), left foot: Secondary | ICD-10-CM | POA: Diagnosis not present

## 2019-07-20 DIAGNOSIS — M21962 Unspecified acquired deformity of left lower leg: Secondary | ICD-10-CM | POA: Diagnosis not present

## 2019-07-20 DIAGNOSIS — K7460 Unspecified cirrhosis of liver: Secondary | ICD-10-CM | POA: Diagnosis not present

## 2019-07-20 DIAGNOSIS — I739 Peripheral vascular disease, unspecified: Secondary | ICD-10-CM | POA: Diagnosis not present

## 2019-07-20 DIAGNOSIS — E1351 Other specified diabetes mellitus with diabetic peripheral angiopathy without gangrene: Secondary | ICD-10-CM | POA: Diagnosis not present

## 2019-07-20 LAB — BASIC METABOLIC PANEL
BUN: 21 mg/dL (ref 6–23)
CO2: 23 mEq/L (ref 19–32)
Calcium: 10.2 mg/dL (ref 8.4–10.5)
Chloride: 99 mEq/L (ref 96–112)
Creatinine, Ser: 1.68 mg/dL — ABNORMAL HIGH (ref 0.40–1.20)
GFR: 29.32 mL/min — ABNORMAL LOW (ref >60.00)
Glucose, Bld: 193 mg/dL — ABNORMAL HIGH (ref 70–99)
Potassium: 3.9 mEq/L (ref 3.5–5.1)
Sodium: 135 mEq/L (ref 135–145)

## 2019-07-20 NOTE — Progress Notes (Signed)
Virtual Visit via Telephone Note  I connected with Amanda [name] on 07/20/19 at  3:30 PM EDT by telephone and verified that I am speaking with the correct person using two identifiers.   I discussed the limitations, risks, security and privacy concerns of performing an evaluation and management service by telephone and the availability of in person appointments. I also discussed with the patient that there may be a patient responsible charge related to this service. The patient expressed understanding and agreed to proceed.  Location patient: home Location provider: work or home office Participants present for the call: patient, provider, husband Patient did not have a visit in the prior 7 days to address this/these issue(s).   History of Present Illness: 78 year old female who  has a past medical history of Allergic rhinitis (01/23/2016), C. difficile colitis, CAD (coronary artery disease), Cancer ([place]), Candidiasis of skin (09/30/2014), Cirrhosis ([place]), Depression, Diabetes mellitus (2008), Gout, Herpes simplex, Hyperglycemia (05/31/2013), Hyperlipidemia, Hypertension, Myocardial infarction [facility]), Neuromuscular disorder ([place]), Obesity, Polycythemia, and Psoriasis.  She is being evaluated today with her husband for medication management.  Both she and her husband report that she is having a difficulty time taking all of her medications and often will feel nauseous and vomited after taking them.  They are wanting to know if any medications can be discontinued  She is taking Phenergan which helps with the nausea and vomiting   She presents to  Observations/Objective: Patient sounds cheerful and well on the phone. I do not appreciate any SOB. Speech and thought processing are grossly intact. Patient reported vitals:  Assessment and Plan: 1. Medication management -Unfortunately, patient does take a lot of medication for her chronic illnesses.  Medications are needed to control these illnesses.   We went through each medication and switched Norvasc, Zetia, and Cozaar to nighttime dosing.  I do not see any medications on her list that could be discontinued.  She can continue to take Phenergan as needed, she was advised to follow-up if nausea and vomiting continue after switching some medications to nighttime dosing  Follow Up Instructions:  I did not refer this patient for an OV in the next 24 hours for this/these issue(s).  I discussed the assessment and treatment plan with the patient. The patient was provided an opportunity to ask questions and all were answered. The patient agreed with the plan and demonstrated an understanding of the instructions.   The patient was advised to call back or seek an in-person evaluation if the symptoms worsen or if the condition fails to improve as anticipated.  I provided 20 minutes of non-face-to-face time during this encounter.   [name], NP

## 2019-07-21 ENCOUNTER — Other Ambulatory Visit: Payer: Self-pay | Admitting: Family

## 2019-07-23 ENCOUNTER — Encounter: Payer: Self-pay | Admitting: *Deleted

## 2019-07-23 ENCOUNTER — Other Ambulatory Visit: Payer: Self-pay | Admitting: *Deleted

## 2019-07-23 DIAGNOSIS — K7460 Unspecified cirrhosis of liver: Secondary | ICD-10-CM

## 2019-07-28 ENCOUNTER — Other Ambulatory Visit: Payer: Self-pay | Admitting: Adult Health

## 2019-07-28 DIAGNOSIS — F329 Major depressive disorder, single episode, unspecified: Secondary | ICD-10-CM

## 2019-07-31 ENCOUNTER — Telehealth: Payer: Self-pay | Admitting: Pharmacy Technician

## 2019-07-31 NOTE — Telephone Encounter (Signed)
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  This will keep the out of pocket expense at $0.     I have spoken with the patient.    The billing information remains the same and is documented below.         [facility] Patient Advocate [facility] Phone [phone] Fax [phone] 07/31/2019 10:10 AM  

## 2019-08-10 ENCOUNTER — Inpatient Hospital Stay (HOSPITAL_COMMUNITY)
Admission: EM | Admit: 2019-08-10 | Discharge: 2019-08-13 | DRG: 683 | Disposition: A | Discharge status: Home Health | Payer: PPO | Attending: Internal Medicine | Admitting: Internal Medicine

## 2019-08-10 ENCOUNTER — Emergency Department (HOSPITAL_COMMUNITY): Payer: PPO

## 2019-08-10 ENCOUNTER — Observation Stay (HOSPITAL_COMMUNITY): Payer: PPO

## 2019-08-10 ENCOUNTER — Other Ambulatory Visit: Payer: Self-pay

## 2019-08-10 ENCOUNTER — Encounter (HOSPITAL_COMMUNITY): Payer: Self-pay

## 2019-08-10 DIAGNOSIS — Z20828 Contact with and (suspected) exposure to other viral communicable diseases: Secondary | ICD-10-CM | POA: Diagnosis present

## 2019-08-10 DIAGNOSIS — E11649 Type 2 diabetes mellitus with hypoglycemia without coma: Secondary | ICD-10-CM | POA: Diagnosis not present

## 2019-08-10 DIAGNOSIS — Z66 Do not resuscitate: Secondary | ICD-10-CM | POA: Diagnosis not present

## 2019-08-10 DIAGNOSIS — I9589 Other hypotension: Secondary | ICD-10-CM | POA: Diagnosis not present

## 2019-08-10 DIAGNOSIS — J418 Mixed simple and mucopurulent chronic bronchitis: Secondary | ICD-10-CM | POA: Diagnosis not present

## 2019-08-10 DIAGNOSIS — Z8249 Family history of ischemic heart disease and other diseases of the circulatory system: Secondary | ICD-10-CM

## 2019-08-10 DIAGNOSIS — E669 Obesity, unspecified: Secondary | ICD-10-CM | POA: Diagnosis present

## 2019-08-10 DIAGNOSIS — R42 Dizziness and giddiness: Secondary | ICD-10-CM | POA: Diagnosis not present

## 2019-08-10 DIAGNOSIS — I5032 Chronic diastolic (congestive) heart failure: Secondary | ICD-10-CM | POA: Diagnosis not present

## 2019-08-10 DIAGNOSIS — Z841 Family history of disorders of kidney and ureter: Secondary | ICD-10-CM

## 2019-08-10 DIAGNOSIS — K7460 Unspecified cirrhosis of liver: Secondary | ICD-10-CM

## 2019-08-10 DIAGNOSIS — E861 Hypovolemia: Secondary | ICD-10-CM

## 2019-08-10 DIAGNOSIS — I959 Hypotension, unspecified: Secondary | ICD-10-CM | POA: Diagnosis not present

## 2019-08-10 DIAGNOSIS — N179 Acute kidney failure, unspecified: Principal | ICD-10-CM

## 2019-08-10 DIAGNOSIS — E1142 Type 2 diabetes mellitus with diabetic polyneuropathy: Secondary | ICD-10-CM | POA: Diagnosis present

## 2019-08-10 DIAGNOSIS — E1122 Type 2 diabetes mellitus with diabetic chronic kidney disease: Secondary | ICD-10-CM | POA: Diagnosis present

## 2019-08-10 DIAGNOSIS — Z8673 Personal history of transient ischemic attack (TIA), and cerebral infarction without residual deficits: Secondary | ICD-10-CM | POA: Diagnosis not present

## 2019-08-10 DIAGNOSIS — Z7902 Long term (current) use of antithrombotics/antiplatelets: Secondary | ICD-10-CM

## 2019-08-10 DIAGNOSIS — N1832 Chronic kidney disease, stage 3b: Secondary | ICD-10-CM | POA: Diagnosis present

## 2019-08-10 DIAGNOSIS — M109 Gout, unspecified: Secondary | ICD-10-CM | POA: Diagnosis not present

## 2019-08-10 DIAGNOSIS — E785 Hyperlipidemia, unspecified: Secondary | ICD-10-CM | POA: Diagnosis not present

## 2019-08-10 DIAGNOSIS — Z87891 Personal history of nicotine dependence: Secondary | ICD-10-CM

## 2019-08-10 DIAGNOSIS — Z833 Family history of diabetes mellitus: Secondary | ICD-10-CM

## 2019-08-10 DIAGNOSIS — Z794 Long term (current) use of insulin: Secondary | ICD-10-CM

## 2019-08-10 DIAGNOSIS — F411 Generalized anxiety disorder: Secondary | ICD-10-CM | POA: Diagnosis present

## 2019-08-10 DIAGNOSIS — S8291XA Unspecified fracture of right lower leg, initial encounter for closed fracture: Secondary | ICD-10-CM | POA: Diagnosis not present

## 2019-08-10 DIAGNOSIS — Z85828 Personal history of other malignant neoplasm of skin: Secondary | ICD-10-CM

## 2019-08-10 DIAGNOSIS — I129 Hypertensive chronic kidney disease with stage 1 through stage 4 chronic kidney disease, or unspecified chronic kidney disease: Secondary | ICD-10-CM | POA: Diagnosis not present

## 2019-08-10 DIAGNOSIS — Z6838 Body mass index (BMI) 38.0-38.9, adult: Secondary | ICD-10-CM

## 2019-08-10 DIAGNOSIS — R627 Adult failure to thrive: Secondary | ICD-10-CM | POA: Diagnosis present

## 2019-08-10 DIAGNOSIS — Z79899 Other long term (current) drug therapy: Secondary | ICD-10-CM

## 2019-08-10 DIAGNOSIS — R0902 Hypoxemia: Secondary | ICD-10-CM | POA: Diagnosis not present

## 2019-08-10 DIAGNOSIS — F329 Major depressive disorder, single episode, unspecified: Secondary | ICD-10-CM | POA: Diagnosis not present

## 2019-08-10 DIAGNOSIS — N183 Chronic kidney disease, stage 3 unspecified: Secondary | ICD-10-CM

## 2019-08-10 DIAGNOSIS — I2510 Atherosclerotic heart disease of native coronary artery without angina pectoris: Secondary | ICD-10-CM | POA: Diagnosis not present

## 2019-08-10 DIAGNOSIS — I130 Hypertensive heart and chronic kidney disease with heart failure and stage 1 through stage 4 chronic kidney disease, or unspecified chronic kidney disease: Secondary | ICD-10-CM | POA: Diagnosis present

## 2019-08-10 DIAGNOSIS — Z880 Allergy status to penicillin: Secondary | ICD-10-CM

## 2019-08-10 DIAGNOSIS — D72828 Other elevated white blood cell count: Secondary | ICD-10-CM | POA: Diagnosis present

## 2019-08-10 DIAGNOSIS — Z8349 Family history of other endocrine, nutritional and metabolic diseases: Secondary | ICD-10-CM

## 2019-08-10 DIAGNOSIS — I10 Essential (primary) hypertension: Secondary | ICD-10-CM | POA: Diagnosis present

## 2019-08-10 DIAGNOSIS — R110 Nausea: Secondary | ICD-10-CM | POA: Diagnosis not present

## 2019-08-10 DIAGNOSIS — D45 Polycythemia vera: Secondary | ICD-10-CM | POA: Diagnosis not present

## 2019-08-10 DIAGNOSIS — R531 Weakness: Secondary | ICD-10-CM | POA: Diagnosis not present

## 2019-08-10 DIAGNOSIS — Z888 Allergy status to other drugs, medicaments and biological substances status: Secondary | ICD-10-CM

## 2019-08-10 DIAGNOSIS — Z803 Family history of malignant neoplasm of breast: Secondary | ICD-10-CM

## 2019-08-10 DIAGNOSIS — Z881 Allergy status to other antibiotic agents status: Secondary | ICD-10-CM

## 2019-08-10 DIAGNOSIS — Z91040 Latex allergy status: Secondary | ICD-10-CM

## 2019-08-10 DIAGNOSIS — I252 Old myocardial infarction: Secondary | ICD-10-CM

## 2019-08-10 LAB — CBC WITH DIFFERENTIAL/PLATELET
Abs Immature Granulocytes: 1.28 10*3/uL — ABNORMAL HIGH (ref 0.00–0.07)
Basophils Absolute: 0.3 10*3/uL — ABNORMAL HIGH (ref 0.0–0.1)
Basophils Relative: 1 %
Eosinophils Absolute: 0 10*3/uL (ref 0.0–0.5)
Eosinophils Relative: 0 %
HCT: 42.3 % (ref 36.0–46.0)
Hemoglobin: 13.5 g/dL (ref 12.0–15.0)
Immature Granulocytes: 6 %
Lymphocytes Relative: 28 %
Lymphs Abs: 5.9 10*3/uL — ABNORMAL HIGH (ref 0.7–4.0)
MCH: 30.7 pg (ref 26.0–34.0)
MCHC: 31.9 g/dL (ref 30.0–36.0)
MCV: 96.1 fL (ref 80.0–100.0)
Monocytes Absolute: 2.3 10*3/uL — ABNORMAL HIGH (ref 0.1–1.0)
Monocytes Relative: 11 %
Neutro Abs: 11.5 10*3/uL — ABNORMAL HIGH (ref 1.7–7.7)
Neutrophils Relative %: 54 %
Platelets: 263 10*3/uL (ref 150–400)
RBC: 4.4 MIL/uL (ref 3.87–5.11)
RDW: 17.1 % — ABNORMAL HIGH (ref 11.5–15.5)
WBC: 21.3 10*3/uL — ABNORMAL HIGH (ref 4.0–10.5)
nRBC: 0.1 % (ref 0.0–0.2)

## 2019-08-10 LAB — COMPREHENSIVE METABOLIC PANEL
ALT: 11 U/L (ref 0–44)
AST: 20 U/L (ref 15–41)
Albumin: 4.6 g/dL (ref 3.5–5.0)
Alkaline Phosphatase: 63 U/L (ref 38–126)
Anion gap: 12 (ref 5–15)
BUN: 43 mg/dL — ABNORMAL HIGH (ref 8–23)
CO2: 19 mmol/L — ABNORMAL LOW (ref 22–32)
Calcium: 9.6 mg/dL (ref 8.9–10.3)
Chloride: 105 mmol/L (ref 98–111)
Creatinine, Ser: 3.05 mg/dL — ABNORMAL HIGH (ref 0.44–1.00)
GFR calc Af Amer: 16 mL/min — ABNORMAL LOW (ref >60)
GFR calc non Af Amer: 14 mL/min — ABNORMAL LOW (ref >60)
Glucose, Bld: 78 mg/dL (ref 70–99)
Potassium: 5.1 mmol/L (ref 3.5–5.1)
Sodium: 136 mmol/L (ref 135–145)
Total Bilirubin: 1.1 mg/dL (ref 0.3–1.2)
Total Protein: 8 g/dL (ref 6.5–8.1)

## 2019-08-10 LAB — BRAIN NATRIURETIC PEPTIDE: B Natriuretic Peptide: 62.7 pg/mL (ref 0.0–100.0)

## 2019-08-10 MED ORDER — SODIUM CHLORIDE 0.9 % IV SOLN: INTRAVENOUS | Status: DC

## 2019-08-10 MED ORDER — SODIUM CHLORIDE 0.9 % IV SOLN
INTRAVENOUS | Status: DC
  Administered 2019-08-10: 125 mL via INTRAVENOUS

## 2019-08-10 MED ORDER — ONDANSETRON HCL 4 MG PO TABS: 4.0000 mg | ORAL_TABLET | Freq: Four times a day (QID) | ORAL | Status: DC | PRN

## 2019-08-10 MED ORDER — ACETAMINOPHEN 325 MG PO TABS: 650.0000 mg | ORAL_TABLET | Freq: Four times a day (QID) | ORAL | Status: DC | PRN

## 2019-08-10 MED ORDER — ACETAMINOPHEN 650 MG RE SUPP: 650.0000 mg | Freq: Four times a day (QID) | RECTAL | Status: DC | PRN

## 2019-08-10 MED ORDER — SODIUM CHLORIDE 0.9 % IV BOLUS
500.0000 mL | Freq: Once | INTRAVENOUS | Status: AC
  Administered 2019-08-10: 500 mL via INTRAVENOUS

## 2019-08-10 MED ORDER — ALBUTEROL SULFATE HFA 108 (90 BASE) MCG/ACT IN AERS
2.0000 | INHALATION_SPRAY | RESPIRATORY_TRACT | Status: DC | PRN
  Filled 2019-08-10: qty 6.7

## 2019-08-10 MED ORDER — ONDANSETRON HCL 4 MG/2ML IJ SOLN: 4.0000 mg | Freq: Four times a day (QID) | INTRAMUSCULAR | Status: DC | PRN

## 2019-08-10 NOTE — H&P (Signed)
History and Physical    Amanda [name] [id number]:[id number] DOB: [birth year] DOA: 08/10/2019  PCP: [name], NP   Patient coming from: Home   Chief Complaint: Lightheaded, loss of appetite, low BP   HPI: Amanda [name] is a 78 y.o. female with medical history significant for hepatic cirrhosis, coronary artery disease, history of CVA, insulin-dependent diabetes mellitus, chronic diastolic CHF, chronic kidney disease stage III, and polycythemia vera, now presenting to the emergency department with a few days of poor appetite, nausea, and lightheadedness.  Patient reports checking her blood pressure at home, finding it to be low, and called EMS who reports SBP of 80 in the field.  Patient denies any abdominal pain, vomiting, or diarrhea, but states that she has just not had an appetite and has not eaten much of anything in the past few days.  She denies any difficulty swallowing.  She also denies any fevers, chills, shortness of breath, cough, chest pain, abdominal pain, dysuria, or flank pain.  She reports continued adherence with all of her medications.  ED Course: Upon arrival to the ED, patient is found to be afebrile, saturating mid 90s on room air, and with blood pressure 89/48.  Chest x-ray is negative for acute cardiopulmonary disease.  Chemistry panel is notable for bicarbonate of 19, BUN 43, and creatinine 3.05, up from 1.68 earlier this month.  CBC features a stable chronic leukocytosis to 21,300.  Differential is pending.  COVID-19 testing is in process.  Patient was started on normal saline infusion in the emergency department.  Review of Systems:  All other systems reviewed and apart from HPI, are negative.  Past Medical History:  Diagnosis Date   Allergic rhinitis 01/23/2016   C. difficile colitis    CAD (coronary artery disease)    a. s/p STEMI in 01/2015 with 95% LCx stenosis and distal 80% LCx stenosis (DESx2 placed)   Cancer ([place])    SKIN   Candidiasis of skin 09/30/2014     Cirrhosis ([place])    Depression    Diabetes mellitus 2008   Gout    Herpes simplex    Hyperglycemia 05/31/2013   Hyperlipidemia    Hypertension    Myocardial infarction ([place])    Neuromuscular disorder ([facility])    BELL PALSY   Obesity    Polycythemia    Dr. Elease Hashimoto- [facility]   Psoriasis     Past Surgical History:  Procedure Laterality Date   ANKLE FRACTURE SURGERY     BIOPSY  05/26/2018   Procedure: BIOPSY;  Surgeon: [name], MD;  Location: [place];  Service: Endoscopy;;   BREAST SURGERY Left    milk duct   CATARACT EXTRACTION, BILATERAL     COLONOSCOPY     ESOPHAGOGASTRODUODENOSCOPY N/A 05/26/2018   Procedure: ESOPHAGOGASTRODUODENOSCOPY (EGD);  Surgeon: [name], MD;  Location: [facility] ENDOSCOPY;  Service: Endoscopy;  Laterality: N/A;   IR PARACENTESIS  05/25/2018   LEFT HEART CATHETERIZATION WITH CORONARY ANGIOGRAM N/A 01/27/2015   Procedure: LEFT HEART CATHETERIZATION WITH CORONARY ANGIOGRAM;  Surgeon: [name], MD;  Location: [facility] CATH LAB;  Service: Cardiovascular;  Laterality: N/A;   TONSILLECTOMY     TOTAL ABDOMINAL HYSTERECTOMY W/ BILATERAL SALPINGOOPHORECTOMY     for heavy periods with appendectomy     reports that she quit smoking about 32 years ago. Her smoking use included cigarettes. She quit after 48.00 years of use. She has never used smokeless tobacco. She reports that she does not drink alcohol or use drugs.  Allergies  Allergen Reactions   Penicillins Swelling    Has patient had a PCN reaction causing immediate rash, facial/tongue/throat swelling, SOB or lightheadedness with hypotension: No Has patient had a PCN reaction causing severe rash involving mucus membranes or skin necrosis: No Has patient had a PCN reaction that required hospitalization: No Has patient had a PCN reaction occurring within the last 10 years: No If all of the above answers are "NO", then may proceed with Cephalosporin use.   Lisinopril Cough    Tape Hives   Doxycycline Hives, Swelling and Rash   Latex Hives, Itching and Rash    Family History  Problem Relation Age of Onset   Diabetes Mother    Heart disease Mother        CAD   Hyperlipidemia Mother    Hypertension Mother    Kidney disease Mother    Kidney disease Father    Breast cancer Maternal Aunt    Heart disease Maternal Grandmother    Heart disease Other        maternal aunts and uncles   Colon cancer Neg Hx    Esophageal cancer Neg Hx      Prior to Admission medications   Medication Sig Start Date End Date Taking? Authorizing Provider  acetaminophen (TYLENOL) 325 MG tablet Take 2 tablets (650 mg total) by mouth every 6 (six) hours as needed for mild pain (or Fever >/= 101). 05/27/18  Yes Georgette Shell, MD  amLODipine (NORVASC) 5 MG tablet TAKE 1 TABLET(5 MG) BY MOUTH DAILY Patient taking differently: Take 5 mg by mouth daily.  07/16/19  Yes [name], MD  clopidogrel (PLAVIX) 75 MG tablet TAKE 1 TABLET(75 MG) BY MOUTH DAILY Patient taking differently: Take 75 mg by mouth daily.  11/03/18  Yes Nafziger, Tommi Rumps, NP  Dulaglutide (TRULICITY) 1.5 LK/9.1PH SOPN Inject 1.5 mg into the skin once a week. 07/10/18  Yes [name], MD  ezetimibe (ZETIA) 10 MG tablet TAKE 1 TABLET(10 MG) BY MOUTH DAILY Patient taking differently: Take 10 mg by mouth daily.  06/15/19  Yes [name], MD  furosemide (LASIX) 20 MG tablet Take 1 tablet (20 mg total) by mouth daily. Take 3 tabs daily to equal 60 mg Patient taking differently: Take 20 mg by mouth daily. Take along with 40 mg tablet 07/05/19  Yes Milus Banister, MD  furosemide (LASIX) 40 MG tablet Take 40 mg by mouth daily. Take along with 20 mg tablet 07/19/19  Yes [provider]  insulin regular human CONCENTRATED (HUMULIN R U-500 KWIKPEN) 500 UNIT/ML kwikpen Inject 90 units in am and 80 units before dinner, under skin 02/14/19  Yes [name], MD  JAKAFI 25 MG tablet TAKE 1 TABLET  (25 MG TOTAL) BY MOUTH 2 (TWO) TIMES DAILY. Patient taking differently: Take 25 mg by mouth 2 (two) times daily.  01/22/19  Yes Ennever, Rudell Cobb, MD  latanoprost (XALATAN) 0.005 % ophthalmic solution Place 1 drop into both eyes at bedtime.  03/23/19  Yes [provider]  losartan (COZAAR) 100 MG tablet TAKE 1 TABLET BY MOUTH EVERY DAY 07/24/19  Yes Nafziger, Tommi Rumps, NP  mirtazapine (REMERON) 7.5 MG tablet TAKE 1 TABLET(7.5 MG) BY MOUTH AT BEDTIME Patient taking differently: Take 7.5 mg by mouth at bedtime.  07/31/19  Yes Nafziger, Tommi Rumps, NP  oxyCODONE-acetaminophen (PERCOCET/ROXICET) 5-325 MG tablet Take 1-2 tablets by mouth every 8 (eight) hours as needed for severe pain. 02/02/19  Yes Nafziger, Tommi Rumps, NP  potassium chloride (  KLOR-CON 10) 10 MEQ tablet Take 1 tablet (10 mEq total) by mouth daily. 03/07/19  Yes Milus Banister, MD  promethazine (PHENERGAN) 25 MG tablet Take 0.5 tablets (12.5 mg total) by mouth every 6 (six) hours as needed for nausea or vomiting. 05/28/18  Yes Emokpae, Ejiroghene E, MD  spironolactone (ALDACTONE) 100 MG tablet Take 1 tablet (100 mg total) by mouth daily. Take with 50 mg Aldactone to equal 150 mg daily 07/05/19  Yes Milus Banister, MD  spironolactone (ALDACTONE) 50 MG tablet Take 1 tablet (50 mg total) by mouth daily. Take one tablet daily along with 152m Aldactone to equal 150 mg daily 07/05/19  Yes JMilus Banister MD  Blood Glucose Monitoring Suppl (ACCU-CHEK GUIDE) w/Device KIT 1 kit by Does not apply route as directed. 01/25/18   GPhilemon Kingdom MD  glucose blood (ACCU-CHEK GUIDE) test strip Use to check blood sugars 3 times daily 01/25/18   GPhilemon Kingdom MD  Insulin Pen Needle (BD PEN NEEDLE NANO U/F) 32G X 4 MM MISC USE AS DIRECTED TO INJECT INSULIN 05/11/19   GPhilemon Kingdom MD  [facility] test strip USE TO TEST BLOOD SUGAR THREE TIMES DAILY 05/01/18   GPhilemon Kingdom MD    Physical Exam: Vitals:   08/10/19 2000 08/10/19 2030 08/10/19 2100  08/10/19 2200  BP: (!) 93/54 (!) 107/53 (!) 89/48 103/62  Pulse: 66 73 69 70  Resp: 14 (!) 25 (!) 23 18  Temp:      TempSrc:      SpO2: 100% 96% 99% 98%    Constitutional: NAD, calm  Eyes: PERTLA, lids and conjunctivae normal ENMT: Mucous membranes are moist. Posterior pharynx clear of any exudate or lesions.   Neck: normal, supple, no masses, no thyromegaly Respiratory: no wheezing, no crackles. Normal respiratory effort. No accessory muscle use.  Cardiovascular: S1 & S2 heard, regular rate and rhythm. No extremity edema.   Abdomen: No distension, no tenderness, soft. Bowel sounds active.  Musculoskeletal: no clubbing / cyanosis. No joint deformity upper and lower extremities.  Skin: no significant rashes, lesions, ulcers. Warm, dry, well-perfused. Neurologic: No facial asymmetry. Sensation intact. Moving all extremities.  Psychiatric: Alert and oriented to person, place, and situation. Pleasant, cooperative.    Labs on Admission: I have personally reviewed following labs and imaging studies  CBC: Recent Labs  Lab 08/10/19 1906  WBC 21.3*  NEUTROABS 11.5*  HGB 13.5  HCT 42.3  MCV 96.1  PLT 2462  Basic Metabolic Panel: Recent Labs  Lab 08/10/19 1906  NA 136  K 5.1  CL 105  CO2 19*  GLUCOSE 78  BUN 43*  CREATININE 3.05*  CALCIUM 9.6   GFR: CrCl cannot be calculated (Unknown ideal weight.). Liver Function Tests: Recent Labs  Lab 08/10/19 1906  AST 20  ALT 11  ALKPHOS 63  BILITOT 1.1  PROT 8.0  ALBUMIN 4.6   No results for input(s): LIPASE, AMYLASE in the last 168 hours. No results for input(s): AMMONIA in the last 168 hours. Coagulation Profile: No results for input(s): INR, PROTIME in the last 168 hours. Cardiac Enzymes: No results for input(s): CKTOTAL, CKMB, CKMBINDEX, TROPONINI in the last 168 hours. BNP (last 3 results) No results for input(s): PROBNP in the last 8760 hours. HbA1C: No results for input(s): HGBA1C in the last 72 hours. CBG: No  results for input(s): GLUCAP in the last 168 hours. Lipid Profile: No results for input(s): CHOL, HDL, LDLCALC, TRIG, CHOLHDL, LDLDIRECT in the last 72 hours.  Thyroid Function Tests: No results for input(s): TSH, T4TOTAL, FREET4, T3FREE, THYROIDAB in the last 72 hours. Anemia Panel: No results for input(s): VITAMINB12, FOLATE, FERRITIN, TIBC, IRON, RETICCTPCT in the last 72 hours. Urine analysis:    Component Value Date/Time   COLORURINE YELLOW 05/23/2018 1341   APPEARANCEUR HAZY (A) 05/23/2018 1341   LABSPEC 1.013 05/23/2018 1341   PHURINE 5.0 05/23/2018 1341   GLUCOSEU NEGATIVE 05/23/2018 1341   GLUCOSEU NEGATIVE 01/10/2018 1217   HGBUR LARGE (A) 05/23/2018 1341   HGBUR large 05/07/2010 0845   BILIRUBINUR NEGATIVE 05/23/2018 1341   BILIRUBINUR neg 01/26/2016 1003   KETONESUR NEGATIVE 05/23/2018 1341   PROTEINUR 100 (A) 05/23/2018 1341   UROBILINOGEN 0.2 01/10/2018 1217   NITRITE NEGATIVE 05/23/2018 1341   LEUKOCYTESUR NEGATIVE 05/23/2018 1341   Sepsis Labs: _0 (procalcitonin:4,lacticidven:4) )No results found for this or any previous visit (from the past 240 hour(s)).   Radiological Exams on Admission: Dg Chest Port 1 View  Result Date: 08/10/2019 CLINICAL DATA:  Weakness, loss of appetite EXAM: PORTABLE CHEST 1 VIEW COMPARISON:  10/02/2018 FINDINGS: Cardiomegaly. Both lungs are clear. The visualized skeletal structures are unremarkable. IMPRESSION: Cardiomegaly without acute abnormality of the lungs in AP portable projection. Electronically Signed   By: Eddie Candle M.D.   On: 08/10/2019 20:32    EKG: Independently reviewed. Sinus rhythm, rate 81, QTc 451 ms.   Assessment/Plan   1. Hypotension; history of HTN  - Patient reports a few days of poor appetite, nausea, and lightheadedness, found her BP to be low and called EMS who reported SBP of 80  - Lowest SBP 89 in ED  - Leukocytosis is chronic and there are no other infectious s/s, no chest pain, no bleeding, and  this appears to be secondary to hypovolemia and continued use of antihypertensives and diuretics  - She was given a 500 cc NS bolus in ED with SBP now in low 100's  - Hold antihypertensives and diuretics, check EKG, culture blood, continue a gentle IVF hydration overnight in light of AKI, give additional boluses as needed for BP    2. Acute kidney injury superimposed on CKD III - SCr is 3.05 on admission, up from an apparent baseline of ~1.6-1.7  - Potassium is wnl, bicarb 19, no hypervolemia  - Likely prerenal azotemia in setting of apparent hypovolemia and hypotension  - Hold diuretics and ARB, renally-dose medications, check RUS and FEUrea, continue gentle IVF hydration, repeat chem panel in am    3. Chronic diastolic CHF  - EF was preserved on echo from June 2019 that demonstrated grade 1 diastolic dysfunction and moderate LAE  - Patient reports chronic bilateral leg edema, but none now  - Hold diuretics and ARB as above, monitor daily wt and I/O's    4. Cirrhosis  - Appears compensated  - Hold diuretics as above    5. Insulin-dependent DM  - No recent A1c  - Managed at home with Trulicity and regular human insulin 90 units qAM and 80 qPM  - She has poor appetite and acute on chronic renal failure and will be continued on insulin with dose reduction and CBG monitoring   6. CAD  - No anginal complaints  - Continue Plavix, hold ARB in light of low BP and AKI    7. History of CVA  - Continue Plavix    8. [name]  - Counts appears stable    PPE: Mask, face shield  DVT prophylaxis: sq heparin   Code Status:  DNR  Family Communication: Husband updated at bedside  Consults called: None  Admission status: Observation    Vianne Bulls, MD [facility] Pager [phone]  If 7PM-7AM, please contact night-coverage [email or web address] Password [email or web address]  08/10/2019, 10:14 PM

## 2019-08-10 NOTE — ED Triage Notes (Signed)
Pt BIB EMS from home. Pt c/o weakness x 3 days and loss of appetite. Pt states she is pale, initially palpated 80 systolic with EMS. Pt denies pain, unable to do PO fluids with EMS due to nausea.   HR 75 CBG 109

## 2019-08-10 NOTE — ED Provider Notes (Signed)
[facility] DEPT Provider Note   CSN: [id number] Arrival date & time: 08/10/19  [place]     History   Chief Complaint Chief Complaint  Patient presents with  . Weakness  . Hypotension    HPI Amanda [name] is a 78 y.o. female.     HPI Patient presents with her husband who provides much of the HPI. Patient has multiple medical issues including hypertension, diabetes, CAD, obesity. They note that she has recently come progressively less active, mobile, is now minimally ambulatory. She has had progression of generalized weakness, without focality. She has no focal pain, though she states that she feels poorly in general. There are some anorexia, nausea, but no vomiting, no diarrhea. This latter item is contested by the patient's husband. No recent medication change, diet change.  Past Medical History:  Diagnosis Date  . Allergic rhinitis 01/23/2016  . C. difficile colitis   . CAD (coronary artery disease)    a. s/p STEMI in 01/2015 with 95% LCx stenosis and distal 80% LCx stenosis (DESx2 placed)  . Cancer ([place])    SKIN  . Candidiasis of skin 09/30/2014  . Cirrhosis ([place])   . Depression   . Diabetes mellitus 2008  . Gout   . Herpes simplex   . Hyperglycemia 05/31/2013  . Hyperlipidemia   . Hypertension   . Myocardial infarction ([place])   . Neuromuscular disorder ([place])    BELL PALSY  . Obesity   . Polycythemia    Dr. Elease Hashimoto- [facility]  . Psoriasis     Patient Active Problem List   Diagnosis Date Noted  . Acute blood loss anemia 05/23/2018  . Dehydration 03/22/2018  . AKI (acute kidney injury) ([place]) 03/22/2018  . CVA (cerebral vascular accident) ([place]) 03/22/2018  . Hip injury, left, subsequent encounter 11/08/2017  . Iron deficiency anemia 12/22/2016  . OSA (obstructive sleep apnea) 04/21/2016  . Osteopenia 04/07/2016  . Lung nodule, solitary 02/05/2016  . Aortic dilatation ([place]) 02/05/2016  . Dyspnea 01/28/2016  . Allergic  rhinitis 01/23/2016  . Hepatic cirrhosis ([place]) 10/28/2015  . Irritable bowel syndrome 08/12/2015  . Obesity (BMI 30-39.9) 04/30/2015  . Diabetic retinopathy of both eyes ([place]) 04/18/2015  . Former smoker 04/18/2015  . GAD (generalized anxiety disorder) 04/18/2015  . Status post primary angioplasty with coronary stent 04/18/2015  . Coronary artery disease involving native coronary artery 03/01/2015  . Acute diastolic heart failure ([place]) 02/01/2015  . ST elevation myocardial infarction (STEMI) involving left circumflex coronary artery in recovery phase ([place]) 01/27/2015  . Idioventricular rhythm ([place])   . Diabetic peripheral neuropathy associated with type 2 diabetes mellitus ([place]) 01/23/2015  . Type 2 diabetes mellitus with diabetic polyneuropathy ([place]) 01/23/2015  . Thrombocytosis ([place]) 07/04/2014  . Cholelithiasis 02/07/2014  . Chronic diarrhea 01/29/2014  . 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 Essential hypertension 05/31/2013  . History of Bell's palsy 05/26/2013  . DERMATITIS, ATOPIC 11/16/2010  . DIZZINESS 11/16/2010  . DIASTOLIC DYSFUNCTION 45/62/5638  . Hyperlipidemia 12/15/2009  . Essential hypertension, benign 11/24/2009  . PSORIASIS 11/24/2009  . Depression 09/13/2009  . 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 ANKLE FRACTURE SURGERY    . BIOPSY  05/26/2018   Procedure: BIOPSY;  Surgeon: [name], MD;  Location: [facility] ENDOSCOPY;  Service: Endoscopy;;  . BREAST SURGERY Left    milk duct  . CATARACT EXTRACTION, BILATERAL    . COLONOSCOPY    .  ESOPHAGOGASTRODUODENOSCOPY N/A 05/26/2018   Procedure: ESOPHAGOGASTRODUODENOSCOPY (EGD);  Surgeon: [name], MD;  Location[facility] ENDOSCOPY;  Service: Endoscopy;  Laterality: N/A;  . IR PARACENTESIS  05/25/2018  . LEFT HEART CATHETERIZATION WITH CORONARY ANGIOGRAM N/A 01/27/2015   Procedure: LEFT HEART CATHETERIZATION WITH CORONARY ANGIOGRAM;   Surgeon: [name], MD;  Location: [facility] CATH LAB;  Service: Cardiovascular;  Laterality: N/A;  . TONSILLECTOMY    . TOTAL ABDOMINAL HYSTERECTOMY W/ BILATERAL SALPINGOOPHORECTOMY     for heavy periods with appendectomy     OB History   No obstetric history on file.      Home Medications    Prior to Admission medications   Medication Sig Start Date End Date Taking? Authorizing Provider  acetaminophen (TYLENOL) 325 MG tablet Take 2 tablets (650 mg total) by mouth every 6 (six) hours as needed for mild pain (or Fever >/= 101). 05/27/18  Yes Georgette Shell, MD  amLODipine (NORVASC) 5 MG tablet TAKE 1 TABLET(5 MG) BY MOUTH DAILY Patient taking differently: Take 5 mg by mouth daily.  07/16/19  Yes [name], MD  clopidogrel (PLAVIX) 75 MG tablet TAKE 1 TABLET(75 MG) BY MOUTH DAILY Patient taking differently: Take 75 mg by mouth daily.  11/03/18  Yes Nafziger, Tommi Rumps, NP  Dulaglutide (TRULICITY) 1.5 XH/3.7JI SOPN Inject 1.5 mg into the skin once a week. 07/10/18  Yes [name], MD  ezetimibe (ZETIA) 10 MG tablet TAKE 1 TABLET(10 MG) BY MOUTH DAILY Patient taking differently: Take 10 mg by mouth daily.  06/15/19  Yes [name], MD  furosemide (LASIX) 20 MG tablet Take 1 tablet (20 mg total) by mouth daily. Take 3 tabs daily to equal 60 mg Patient taking differently: Take 20 mg by mouth daily. Take along with 40 mg tablet 07/05/19  Yes Milus Banister, MD  furosemide (LASIX) 40 MG tablet Take 40 mg by mouth daily. Take along with 20 mg tablet 07/19/19  Yes [provider]  insulin regular human CONCENTRATED (HUMULIN R U-500 KWIKPEN) 500 UNIT/ML kwikpen Inject 90 units in am and 80 units before dinner, under skin 02/14/19  Yes [name], MD  JAKAFI 25 MG tablet TAKE 1 TABLET (25 MG TOTAL) BY MOUTH 2 (TWO) TIMES DAILY. Patient taking differently: Take 25 mg by mouth 2 (two) times daily.  01/22/19  Yes Ennever, Rudell Cobb, MD  latanoprost (XALATAN) 0.005 % ophthalmic  solution Place 1 drop into both eyes at bedtime.  03/23/19  Yes [provider]  losartan (COZAAR) 100 MG tablet TAKE 1 TABLET BY MOUTH EVERY DAY 07/24/19  Yes Nafziger, Tommi Rumps, NP  mirtazapine (REMERON) 7.5 MG tablet TAKE 1 TABLET(7.5 MG) BY MOUTH AT BEDTIME Patient taking differently: Take 7.5 mg by mouth at bedtime.  07/31/19  Yes Nafziger, Tommi Rumps, NP  oxyCODONE-acetaminophen (PERCOCET/ROXICET) 5-325 MG tablet Take 1-2 tablets by mouth every 8 (eight) hours as needed for severe pain. 02/02/19  Yes Nafziger, Tommi Rumps, NP  potassium chloride (KLOR-CON 10) 10 MEQ tablet Take 1 tablet (10 mEq total) by mouth daily. 03/07/19  Yes Milus Banister, MD  promethazine (PHENERGAN) 25 MG tablet Take 0.5 tablets (12.5 mg total) by mouth every 6 (six) hours as needed for nausea or vomiting. 05/28/18  Yes Emokpae, Ejiroghene E, MD  spironolactone (ALDACTONE) 100 MG tablet Take 1 tablet (100 mg total) by mouth daily. Take with 50 mg Aldactone to equal 150 mg daily 07/05/19  Yes Milus Banister, MD  spironolactone (ALDACTONE) 50 MG tablet Take 1  tablet (50 mg total) by mouth daily. Take one tablet daily along with 118m Aldactone to equal 150 mg daily 07/05/19  Yes JMilus Banister MD  Blood Glucose Monitoring Suppl (ACCU-CHEK GUIDE) w/Device KIT 1 kit by Does not apply route as directed. 01/25/18   GPhilemon Kingdom MD  diphenoxylate-atropine (LOMOTIL) 2.5-0.025 MG tablet Take 1 tablet by mouth twice daily as needed for diarrhea. Patient not taking: Reported on 08/10/2019 08/16/18   Esterwood, Amy S, PA-C  fluticasone (FLONASE) 50 MCG/ACT nasal spray Place 1 spray into both nostrils 2 (two) times daily. Patient not taking: Reported on 08/10/2019 09/27/18   JMartinique Betty G, MD  glucose blood (ACCU-CHEK GUIDE) test strip Use to check blood sugars 3 times daily 01/25/18   GPhilemon Kingdom MD  Insulin Pen Needle (BD PEN NEEDLE NANO U/F) 32G X 4 MM MISC USE AS DIRECTED TO INJECT INSULIN 05/11/19   GPhilemon Kingdom MD   [facility] test strip USE TO TEST BLOOD SUGAR THREE TIMES DAILY 05/01/18   GPhilemon Kingdom MD  spironolactone (ALDACTONE) 100 MG tablet Take 2 tablets daily. Patient not taking: Reported on 08/10/2019 02/28/19   JMilus Banister MD  triamcinolone ointment (KENALOG) 0.5 % Apply 1 application topically 2 (two) times daily. Patient not taking: Reported on 08/10/2019 05/28/18   EBethena Roys MD    Family History Family History  Problem Relation Age of Onset  . Diabetes Mother   . Heart disease Mother        CAD  . Hyperlipidemia Mother   . Hypertension Mother   . Kidney disease Mother   . Kidney disease Father   . Breast cancer Maternal Aunt   . Heart disease Maternal Grandmother   . Heart disease Other        maternal aunts and uncles  . Colon cancer Neg Hx   . Esophageal cancer Neg Hx     Social History Social History   Tobacco Use  . Smoking status: Former Smoker    Years: 48.00    Types: Cigarettes    Quit date: 10/18/1986    Years since quitting: 32.8  . Smokeless tobacco: Never Used  Substance Use Topics  . Alcohol use: No    Alcohol/week: 0.0 standard drinks  . Drug use: No     Allergies   Penicillins, Lisinopril, Tape, Doxycycline, and Latex   Review of Systems Review of Systems  Constitutional:       Per HPI, otherwise negative  HENT:       Per HPI, otherwise negative  Respiratory:       Per HPI, otherwise negative  Cardiovascular:       Per HPI, otherwise negative  Gastrointestinal: Positive for nausea. Negative for vomiting.  Endocrine:       Negative aside from HPI  Genitourinary:       Neg aside from HPI   Musculoskeletal:       Per HPI, otherwise negative  Skin: Negative.   Neurological: Positive for weakness. Negative for syncope.     Physical Exam Updated Vital Signs BP (!) 89/48   Pulse 69   Temp 98.3 F (36.8 C) (Oral)   Resp (!) 23   SpO2 99%   Physical Exam Vitals signs and nursing note reviewed.   Constitutional:      General: She is not in acute distress.    Appearance: She is well-developed. She is obese. She is ill-appearing.  HENT:     Head: Normocephalic and atraumatic.  Eyes:     Conjunctiva/sclera: Conjunctivae normal.  Cardiovascular:     Rate and Rhythm: Normal rate and regular rhythm.  Pulmonary:     Effort: Pulmonary effort is normal. No respiratory distress.     Breath sounds: Normal breath sounds. No stridor. No wheezing.  Abdominal:     General: There is no distension.  Skin:    General: Skin is warm and dry.  Neurological:     Mental Status: She is alert and oriented to person, place, and time.     Cranial Nerves: No cranial nerve deficit.      ED Treatments / Results  Labs (all labs ordered are listed, but only abnormal results are displayed) Labs Reviewed  CBC WITH DIFFERENTIAL/PLATELET - Abnormal; Notable for the following components:      Result Value   WBC 21.3 (*)    RDW 17.1 (*)    All other components within normal limits  COMPREHENSIVE METABOLIC PANEL - Abnormal; Notable for the following components:   CO2 19 (*)    BUN 43 (*)    Creatinine, Ser 3.05 (*)    GFR calc non Af Amer 14 (*)    GFR calc Af Amer 16 (*)    All other components within normal limits  SARS CORONAVIRUS 2 (TAT 6-24 HRS)  URINALYSIS, ROUTINE W REFLEX MICROSCOPIC  BRAIN NATRIURETIC PEPTIDE  MAGNESIUM    Radiology Dg Chest Port 1 View  Result Date: 08/10/2019 CLINICAL DATA:  Weakness, loss of appetite EXAM: PORTABLE CHEST 1 VIEW COMPARISON:  10/02/2018 FINDINGS: Cardiomegaly. Both lungs are clear. The visualized skeletal structures are unremarkable. IMPRESSION: Cardiomegaly without acute abnormality of the lungs in AP portable projection. Electronically Signed   By: Eddie Candle M.D.   On: 08/10/2019 20:32    Procedures Procedures (including critical care time)  Medications Ordered in ED Medications  0.9 %  sodium chloride infusion (125 mLs Intravenous New  Bag/Given 08/10/19 2118)     Initial Impression / Assessment and Plan / ED Course  I have reviewed the triage vital signs and the nursing notes.  Pertinent labs & imaging results that were available during my care of the patient were reviewed by me and considered in my medical decision making (see chart for details).   9:55 PM Where the patient had appropriate blood pressure on arrival, initial triage report was that she was hypotensive in the field. Here the patient has had decreasing blood pressure, though she has been essentially immobile for several hours. She remains the same cognitively.  She and her husband are aware of initial findings including concern for acute kidney injury with a creatinine now greater than 3, BUN 43. Patient is mildly edematous, but x-ray does not suggest heart failure decompensation, the patient is receiving fluid resuscitation. Patient was found of leukocytosis, though this is consistent for her, and she has a known history of polycythemia vera as well.  With concern for acute kidney injury, weakness, hypotension, the patient will require admission. On admission multiple studies are pending.  Final Clinical Impressions(s) / ED Diagnoses  Weakness Acute kidney injury   Carmin Muskrat, MD 08/10/19 2249

## 2019-08-11 DIAGNOSIS — I9589 Other hypotension: Secondary | ICD-10-CM | POA: Diagnosis not present

## 2019-08-11 DIAGNOSIS — E861 Hypovolemia: Secondary | ICD-10-CM | POA: Diagnosis not present

## 2019-08-11 LAB — URINALYSIS, ROUTINE W REFLEX MICROSCOPIC
Bilirubin Urine: NEGATIVE
Glucose, UA: NEGATIVE mg/dL
Ketones, ur: NEGATIVE mg/dL
Nitrite: NEGATIVE
Protein, ur: NEGATIVE mg/dL
Specific Gravity, Urine: 1.008 (ref 1.005–1.030)
pH: 5 (ref 5.0–8.0)

## 2019-08-11 LAB — CBC WITH DIFFERENTIAL/PLATELET
Abs Immature Granulocytes: 0.9 10*3/uL — ABNORMAL HIGH (ref 0.00–0.07)
Basophils Absolute: 0.1 10*3/uL (ref 0.0–0.1)
Basophils Relative: 1 %
Eosinophils Absolute: 0 10*3/uL (ref 0.0–0.5)
Eosinophils Relative: 0 %
HCT: 38.4 % (ref 36.0–46.0)
Hemoglobin: 12.3 g/dL (ref 12.0–15.0)
Immature Granulocytes: 5 %
Lymphocytes Relative: 21 %
Lymphs Abs: 3.6 10*3/uL (ref 0.7–4.0)
MCH: 30.5 pg (ref 26.0–34.0)
MCHC: 32 g/dL (ref 30.0–36.0)
MCV: 95.3 fL (ref 80.0–100.0)
Monocytes Absolute: 1.4 10*3/uL — ABNORMAL HIGH (ref 0.1–1.0)
Monocytes Relative: 8 %
Neutro Abs: 10.8 10*3/uL — ABNORMAL HIGH (ref 1.7–7.7)
Neutrophils Relative %: 65 %
Platelets: 210 10*3/uL (ref 150–400)
RBC: 4.03 MIL/uL (ref 3.87–5.11)
RDW: 17.2 % — ABNORMAL HIGH (ref 11.5–15.5)
WBC: 16.9 10*3/uL — ABNORMAL HIGH (ref 4.0–10.5)
nRBC: 0.1 % (ref 0.0–0.2)

## 2019-08-11 LAB — CBG MONITORING, ED
Glucose-Capillary: 60 mg/dL — ABNORMAL LOW (ref 70–99)
Glucose-Capillary: 65 mg/dL — ABNORMAL LOW (ref 70–99)
Glucose-Capillary: 83 mg/dL (ref 70–99)
Glucose-Capillary: 96 mg/dL (ref 70–99)
Glucose-Capillary: 87 mg/dL (ref 70–99)
Glucose-Capillary: 121 mg/dL — ABNORMAL HIGH (ref 70–99)

## 2019-08-11 LAB — CREATININE, URINE, RANDOM: Creatinine, Urine: 80.64 mg/dL

## 2019-08-11 LAB — SARS CORONAVIRUS 2 (TAT 6-24 HRS): SARS Coronavirus 2: NEGATIVE

## 2019-08-11 LAB — BASIC METABOLIC PANEL
Anion gap: 9 (ref 5–15)
BUN: 44 mg/dL — ABNORMAL HIGH (ref 8–23)
CO2: 19 mmol/L — ABNORMAL LOW (ref 22–32)
Calcium: 9 mg/dL (ref 8.9–10.3)
Chloride: 107 mmol/L (ref 98–111)
Creatinine, Ser: 2.84 mg/dL — ABNORMAL HIGH (ref 0.44–1.00)
GFR calc Af Amer: 18 mL/min — ABNORMAL LOW (ref >60)
GFR calc non Af Amer: 15 mL/min — ABNORMAL LOW (ref >60)
Glucose, Bld: 106 mg/dL — ABNORMAL HIGH (ref 70–99)
Potassium: 5 mmol/L (ref 3.5–5.1)
Sodium: 135 mmol/L (ref 135–145)

## 2019-08-11 LAB — SODIUM, URINE, RANDOM: Sodium, Ur: 78 mmol/L

## 2019-08-11 MED ORDER — INSULIN DETEMIR 100 UNIT/ML ~~LOC~~ SOLN
40.0000 [IU] | Freq: Two times a day (BID) | SUBCUTANEOUS | Status: DC
  Filled 2019-08-11: qty 0.4

## 2019-08-11 MED ORDER — MIRTAZAPINE 15 MG PO TABS
7.5000 mg | ORAL_TABLET | Freq: Every day | ORAL | Status: DC
  Administered 2019-08-11 – 2019-08-12 (×2): 7.5 mg via ORAL
  Filled 2019-08-11 (×2): qty 1

## 2019-08-11 MED ORDER — OXYCODONE-ACETAMINOPHEN 5-325 MG PO TABS: 1.0000 | ORAL_TABLET | Freq: Three times a day (TID) | ORAL | Status: DC | PRN

## 2019-08-11 MED ORDER — SODIUM CHLORIDE 0.9 % IV SOLN
INTRAVENOUS | Status: DC
  Administered 2019-08-11 – 2019-08-12 (×4): via INTRAVENOUS

## 2019-08-11 MED ORDER — INSULIN ASPART 100 UNIT/ML ~~LOC~~ SOLN
0.0000 [IU] | Freq: Three times a day (TID) | SUBCUTANEOUS | Status: DC
  Administered 2019-08-12 – 2019-08-13 (×2): 1 [IU] via SUBCUTANEOUS
  Filled 2019-08-11: qty 0.09

## 2019-08-11 MED ORDER — CLOPIDOGREL BISULFATE 75 MG PO TABS
75.0000 mg | ORAL_TABLET | Freq: Every day | ORAL | Status: DC
  Administered 2019-08-11 – 2019-08-13 (×3): 75 mg via ORAL
  Filled 2019-08-11 (×3): qty 1

## 2019-08-11 MED ORDER — CHLORHEXIDINE GLUCONATE CLOTH 2 % EX PADS
6.0000 | MEDICATED_PAD | Freq: Every day | CUTANEOUS | Status: DC
  Administered 2019-08-12: 6 via TOPICAL

## 2019-08-11 MED ORDER — HEPARIN SODIUM (PORCINE) 5000 UNIT/ML IJ SOLN: 5000.0000 [IU] | Freq: Three times a day (TID) | INTRAMUSCULAR | Status: DC

## 2019-08-11 MED ORDER — CLOPIDOGREL BISULFATE 75 MG PO TABS: 75.0000 mg | ORAL_TABLET | Freq: Every day | ORAL | Status: DC

## 2019-08-11 MED ORDER — SODIUM CHLORIDE 0.9 % IV SOLN: 250.0000 mL | INTRAVENOUS | Status: DC | PRN

## 2019-08-11 MED ORDER — SODIUM CHLORIDE 0.9% FLUSH
3.0000 mL | Freq: Two times a day (BID) | INTRAVENOUS | Status: DC
  Administered 2019-08-12 (×2): 3 mL via INTRAVENOUS

## 2019-08-11 MED ORDER — SODIUM CHLORIDE 0.9 % IV BOLUS
500.0000 mL | Freq: Once | INTRAVENOUS | Status: AC
  Administered 2019-08-11: 500 mL via INTRAVENOUS

## 2019-08-11 MED ORDER — HEPARIN SODIUM (PORCINE) 5000 UNIT/ML IJ SOLN
5000.0000 [IU] | Freq: Three times a day (TID) | INTRAMUSCULAR | Status: DC
  Administered 2019-08-11 – 2019-08-13 (×5): 5000 [IU] via SUBCUTANEOUS
  Filled 2019-08-11 (×6): qty 1

## 2019-08-11 MED ORDER — INSULIN DETEMIR 100 UNIT/ML ~~LOC~~ SOLN: 40.0000 [IU] | Freq: Two times a day (BID) | SUBCUTANEOUS | Status: DC

## 2019-08-11 MED ORDER — SODIUM CHLORIDE 0.9% FLUSH: 3.0000 mL | INTRAVENOUS | Status: DC | PRN

## 2019-08-11 MED ORDER — INSULIN ASPART 100 UNIT/ML ~~LOC~~ SOLN
0.0000 [IU] | Freq: Every day | SUBCUTANEOUS | Status: DC
  Filled 2019-08-11: qty 0.05

## 2019-08-11 MED ORDER — LATANOPROST 0.005 % OP SOLN
1.0000 [drp] | Freq: Every day | OPHTHALMIC | Status: DC
  Administered 2019-08-11 – 2019-08-12 (×2): 1 [drp] via OPHTHALMIC
  Filled 2019-08-11: qty 2.5

## 2019-08-11 MED ORDER — SODIUM CHLORIDE 0.9% FLUSH
3.0000 mL | Freq: Two times a day (BID) | INTRAVENOUS | Status: DC
  Administered 2019-08-12: 3 mL via INTRAVENOUS

## 2019-08-11 NOTE — ED Notes (Signed)
Nurse Secretary has paged hospitalist to update provider on orthostatic vitals.

## 2019-08-11 NOTE — ED Notes (Addendum)
Pt given chicken noodle soup, coke, and crackers at this time. Pt repositioned to be able to eat better. Husband at bedside assisting the pt to eat. No other needs expressed at this time.

## 2019-08-11 NOTE — ED Notes (Signed)
Orthostatic vital signs were attempted. Patient attempted to sit up with assistance but then stated that she could not sit up and just wanted to lie down.

## 2019-08-11 NOTE — ED Notes (Signed)
Husband at bedside is attempting to motivate patient to eat.

## 2019-08-11 NOTE — ED Notes (Signed)
ED TO INPATIENT HANDOFF REPORT  ED Nurse Name and Phone #:  Grandville Silos, RN  S Name/Age/Gender Amanda [name] 78 y.o. female Room/Bed: WA20/WA20  Code Status   Code Status: DNR  Home/SNF/Other Home Patient oriented to: self, place, time and situation Is this baseline? Yes   Triage Complete: Triage complete  Chief Complaint Weakness and Orthostatic Hypotension  Triage Note Pt BIB EMS from home. Pt c/o weakness x 3 days and loss of appetite. Pt states she is pale, initially palpated 80 systolic with EMS. Pt denies pain, unable to do PO fluids with EMS due to nausea.   HR 75 CBG 109    Allergies Allergies  Allergen Reactions  . Penicillins Swelling    Has patient had a PCN reaction causing immediate rash, facial/tongue/throat swelling, SOB or lightheadedness with hypotension: No Has patient had a PCN reaction causing severe rash involving mucus membranes or skin necrosis: No Has patient had a PCN reaction that required hospitalization: No Has patient had a PCN reaction occurring within the last 10 years: No If all of the above answers are "NO", then may proceed with Cephalosporin use.  Marland Kitchen Lisinopril Cough  . Tape Hives  . Doxycycline Hives, Swelling and Rash  . Latex Hives, Itching and Rash    Level of Care/Admitting Diagnosis ED Disposition    ED Disposition Condition [facility] Area: [place] [[id number]]  Level of Care: Stepdown [14]  Admit to SDU based on following criteria: Hemodynamic compromise or significant risk of instability:  Patient requiring short term acute titration and management of vasoactive drips, and invasive monitoring (i.e., CVP and Arterial line).  Covid Evaluation: Asymptomatic Screening Protocol (No Symptoms)  Diagnosis: Hypotension due to hypovolemia [6440347]  Admitting Physician: Vianne Bulls [[id number]]  Attending Physician: Vianne Bulls [[id number]]  PT Class (Do Not Modify): Observation [104]  PT Acc Code  (Do Not Modify): Observation [10022]       B Medical/Surgery History Past Medical History:  Diagnosis Date  . Allergic rhinitis 01/23/2016  . C. difficile colitis   . CAD (coronary artery disease)    a. s/p STEMI in 01/2015 with 95% LCx stenosis and distal 80% LCx stenosis (DESx2 placed)  . Cancer ([place])    SKIN  . Candidiasis of skin 09/30/2014  . Cirrhosis ([place])   . Depression   . Diabetes mellitus 2008  . Gout   . Herpes simplex   . Hyperglycemia 05/31/2013  . Hyperlipidemia   . Hypertension   . Myocardial infarction ([place])   . 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 Obesity   . Polycythemia    Dr. Elease Hashimoto- [facility]  . Psoriasis    Past Surgical History:  Procedure Laterality Date  . ANKLE FRACTURE SURGERY    . BIOPSY  05/26/2018   Procedure: BIOPSY;  Surgeon: [name], MD;  Location: [facility] ENDOSCOPY;  Service: Endoscopy;;  . BREAST SURGERY Left    milk duct  . CATARACT EXTRACTION, BILATERAL    . COLONOSCOPY    . ESOPHAGOGASTRODUODENOSCOPY N/A 05/26/2018   Procedure: ESOPHAGOGASTRODUODENOSCOPY (EGD);  Surgeon: [name], MD;  Location[facility] ENDOSCOPY;  Service: Endoscopy;  Laterality: N/A;  . IR PARACENTESIS  05/25/2018  . LEFT HEART CATHETERIZATION WITH CORONARY ANGIOGRAM N/A 01/27/2015   Procedure: LEFT HEART CATHETERIZATION WITH CORONARY ANGIOGRAM;  Surgeon: [name], MD;  Location: [facility] CATH LAB;  Service: Cardiovascular;  Laterality: N/A;  . TONSILLECTOMY    . TOTAL  ABDOMINAL HYSTERECTOMY W/ BILATERAL SALPINGOOPHORECTOMY     for heavy periods with appendectomy     A IV Location/Drains/Wounds Patient Lines/Drains/Airways Status   Active Line/Drains/Airways    Name:   Placement date:   Placement time:   Site:   Days:   Peripheral IV 08/10/19 Left;Anterior Forearm   08/10/19    2013    Forearm   1   Incision (Closed) 05/25/18 Abdomen Left;Lower   05/25/18    1800     443          Intake/Output Last 24 hours  Intake/Output Summary (Last 24  hours) at 08/11/2019 2229 Last data filed at 08/11/2019 1949 Gross per 24 hour  Intake 1151.28 ml  Output 120 ml  Net 1031.28 ml    Labs/Imaging Results for orders placed or performed during the hospital encounter of 08/10/19 (from the past 48 hour(s))  CBC with Differential     Status: Abnormal   Collection Time: 08/10/19  7:06 PM  Result Value Ref Range   WBC 21.3 (H) 4.0 - 10.5 K/uL   RBC 4.40 3.87 - 5.11 MIL/uL   Hemoglobin 13.5 12.0 - 15.0 g/dL   HCT 42.3 36.0 - 46.0 %   MCV 96.1 80.0 - 100.0 fL   MCH 30.7 26.0 - 34.0 pg   MCHC 31.9 30.0 - 36.0 g/dL   RDW 17.1 (H) 11.5 - 15.5 %   Platelets 263 150 - 400 K/uL   nRBC 0.1 0.0 - 0.2 %   Neutrophils Relative % 54 %   Neutro Abs 11.5 (H) 1.7 - 7.7 K/uL   Lymphocytes Relative 28 %   Lymphs Abs 5.9 (H) 0.7 - 4.0 K/uL   Monocytes Relative 11 %   Monocytes Absolute 2.3 (H) 0.1 - 1.0 K/uL   Eosinophils Relative 0 %   Eosinophils Absolute 0.0 0.0 - 0.5 K/uL   Basophils Relative 1 %   Basophils Absolute 0.3 (H) 0.0 - 0.1 K/uL   WBC Morphology MILD LEFT SHIFT (1-5% METAS, OCC MYELO, OCC BANDS)    Immature Granulocytes 6 %   Abs Immature Granulocytes 1.28 (H) 0.00 - 0.07 K/uL   Polychromasia PRESENT     Comment: Performed at [facility], [place] [address] [place], [place] [address]  Comprehensive metabolic panel     Status: Abnormal   Collection Time: 08/10/19  7:06 PM  Result Value Ref Range   Sodium 136 135 - 145 mmol/L   Potassium 5.1 3.5 - 5.1 mmol/L   Chloride 105 98 - 111 mmol/L   CO2 19 (L) 22 - 32 mmol/L   Glucose, Bld 78 70 - 99 mg/dL   BUN 43 (H) 8 - 23 mg/dL   Creatinine, Ser 3.05 (H) 0.44 - 1.00 mg/dL   Calcium 9.6 8.9 - 10.3 mg/dL   Total Protein 8.0 6.5 - 8.1 g/dL   Albumin 4.6 3.5 - 5.0 g/dL   AST 20 15 - 41 U/L   ALT 11 0 - 44 U/L   Alkaline Phosphatase 63 38 - 126 U/L   Total Bilirubin 1.1 0.3 - 1.2 mg/dL   GFR calc non Af Amer 14 (L) >60 mL/min   GFR calc Af Amer 16 (L) >60 mL/min    Anion gap 12 5 - 15    Comment: Performed at [facility], [place] [address], [place], [place] [address]  Brain natriuretic peptide     Status: None   Collection Time: 08/10/19  7:06 PM  Result  Value Ref Range   B Natriuretic Peptide 62.7 0.0 - 100.0 pg/mL    Comment: Performed at [facility], [place] [address] [place], [place] [address]  Urinalysis, Routine w reflex microscopic     Status: Abnormal   Collection Time: 08/10/19  8:08 PM  Result Value Ref Range   Color, Urine YELLOW YELLOW   APPearance CLEAR CLEAR   Specific Gravity, Urine 1.008 1.005 - 1.030   pH 5.0 5.0 - 8.0   Glucose, UA NEGATIVE NEGATIVE mg/dL   Hgb urine dipstick SMALL (A) NEGATIVE   Bilirubin Urine NEGATIVE NEGATIVE   Ketones, ur NEGATIVE NEGATIVE mg/dL   Protein, ur NEGATIVE NEGATIVE mg/dL   Nitrite NEGATIVE NEGATIVE   Leukocytes,Ua TRACE (A) NEGATIVE   RBC / HPF 0-5 0 - 5 RBC/hpf   WBC, UA 0-5 0 - 5 WBC/hpf   Bacteria, UA MANY (A) NONE SEEN   Squamous Epithelial / LPF 0-5 0 - 5   Hyaline Casts, UA PRESENT     Comment: Performed at [facility], [place] [address], [place], [place] [address]  SARS CORONAVIRUS 2 (TAT 6-24 HRS) Nasopharyngeal Nasopharyngeal Swab     Status: None   Collection Time: 08/10/19  8:55 PM   Specimen: Nasopharyngeal Swab  Result Value Ref Range   SARS Coronavirus 2 NEGATIVE NEGATIVE    Comment: (NOTE) SARS-CoV-2 target nucleic acids are NOT DETECTED. The SARS-CoV-2 RNA is generally detectable in upper and lower respiratory specimens during the acute phase of infection. Negative results do not preclude SARS-CoV-2 infection, do not rule out co-infections with other pathogens, and should not be used as the sole basis for treatment or other patient management decisions. Negative results must be combined with clinical observations, patient history, and epidemiological information. The expected result is Negative. Fact Sheet for  Patients: SugarRoll.be Fact Sheet for Healthcare Providers: https://www.woods-mathews.com/ This test is not yet approved or cleared by the [place] FDA and  has been authorized for detection and/or diagnosis of SARS-CoV-2 by FDA under an Emergency Use Authorization (EUA). This EUA will remain  in effect (meaning this test can be used) for the duration of the COVID-19 declaration under Section 56 4(b)(1) of the Act, 21 U.S.C. section 360bbb-3(b)(1), unless the authorization is terminated or revoked sooner. Performed at [facility], [place] [address] [place], [place] [address]   Culture, blood (routine x 2)     Status: None (Preliminary result)   Collection Time: 08/10/19 10:08 PM   Specimen: BLOOD LEFT FOREARM  Result Value Ref Range   Specimen Description      BLOOD LEFT FOREARM Performed at [facility], [place] [address], [place], [place] [address]    Special Requests      BOTTLES DRAWN AEROBIC AND ANAEROBIC Blood Culture adequate volume Performed at [place] [address] [place], [place] [address]    Culture PENDING    Report Status PENDING   Sodium, urine, random     Status: None   Collection Time: 08/10/19 10:08 PM  Result Value Ref Range   Sodium, Ur 78 mmol/L    Comment: Performed at [facility], [place] [address] [place], [place] [address]  Creatinine, urine, random     Status: None   Collection Time: 08/10/19 10:08 PM  Result Value Ref Range   Creatinine, Urine 80.64 mg/dL    Comment: Performed at [facility], [place] [address], [place], [place] [address]  CBG monitoring,  ED     Status: Abnormal   Collection Time: 08/11/19  8:46 AM  Result Value Ref Range   Glucose-Capillary 60 (L) 70 - 99 mg/dL  CBG monitoring, ED     Status: Abnormal   Collection Time: 08/11/19  9:07 AM  Result Value Ref Range   Glucose-Capillary 65 (L) 70 - 99 mg/dL  CBG  monitoring, ED     Status: None   Collection Time: 08/11/19 10:36 AM  Result Value Ref Range   Glucose-Capillary 83 70 - 99 mg/dL  CBG monitoring, ED     Status: None   Collection Time: 08/11/19 12:37 PM  Result Value Ref Range   Glucose-Capillary 96 70 - 99 mg/dL  Basic metabolic panel     Status: Abnormal   Collection Time: 08/11/19 12:40 PM  Result Value Ref Range   Sodium 135 135 - 145 mmol/L   Potassium 5.0 3.5 - 5.1 mmol/L   Chloride 107 98 - 111 mmol/L   CO2 19 (L) 22 - 32 mmol/L   Glucose, Bld 106 (H) 70 - 99 mg/dL   BUN 44 (H) 8 - 23 mg/dL   Creatinine, Ser 2.84 (H) 0.44 - 1.00 mg/dL   Calcium 9.0 8.9 - 10.3 mg/dL   GFR calc non Af Amer 15 (L) >60 mL/min   GFR calc Af Amer 18 (L) >60 mL/min   Anion gap 9 5 - 15    Comment: Performed at [facility], [place] [address] [place], [place] [address]  CBC WITH DIFFERENTIAL     Status: Abnormal   Collection Time: 08/11/19  4:30 PM  Result Value Ref Range   WBC 16.9 (H) 4.0 - 10.5 K/uL   RBC 4.03 3.87 - 5.11 MIL/uL   Hemoglobin 12.3 12.0 - 15.0 g/dL   HCT 38.4 36.0 - 46.0 %   MCV 95.3 80.0 - 100.0 fL   MCH 30.5 26.0 - 34.0 pg   MCHC 32.0 30.0 - 36.0 g/dL   RDW 17.2 (H) 11.5 - 15.5 %   Platelets 210 150 - 400 K/uL   nRBC 0.1 0.0 - 0.2 %   Neutrophils Relative % 65 %   Neutro Abs 10.8 (H) 1.7 - 7.7 K/uL   Lymphocytes Relative 21 %   Lymphs Abs 3.6 0.7 - 4.0 K/uL   Monocytes Relative 8 %   Monocytes Absolute 1.4 (H) 0.1 - 1.0 K/uL   Eosinophils Relative 0 %   Eosinophils Absolute 0.0 0.0 - 0.5 K/uL   Basophils Relative 1 %   Basophils Absolute 0.1 0.0 - 0.1 K/uL   Immature Granulocytes 5 %   Abs Immature Granulocytes 0.90 (H) 0.00 - 0.07 K/uL    Comment: Performed at [facility], [place] [address] [place], [place] [address]  CBG monitoring, ED     Status: None   Collection Time: 08/11/19  4:32 PM  Result Value Ref Range   Glucose-Capillary 87 70 - 99 mg/dL  CBG monitoring, ED      Status: Abnormal   Collection Time: 08/11/19  9:43 PM  Result Value Ref Range   Glucose-Capillary 121 (H) 70 - 99 mg/dL   *Note: Due to a large number of results and/or encounters for the requested time period, some results have not been displayed. A complete set of results can be found in Results Review.   US Renal  Result Date: 08/10/2019 CLINICAL DATA:  Initial evaluation for acute renal failure. EXAM: RENAL / URINARY TRACT ULTRASOUND COMPLETE COMPARISON:  Prior CT from 05/23/2018. FINDINGS: Right Kidney: Renal measurements: 10.0 x 4.1 x 5.3 cm = volume: 115 mL . Echogenicity within normal limits. No mass or hydronephrosis visualized. Left Kidney: Renal measurements: 10.4 x 4.7 x 5.6 cm = volume: 143 mL. Echogenicity within normal limits. No mass or hydronephrosis visualized. Bladder: Appears normal for degree of bladder distention. Other: None. IMPRESSION: Normal renal ultrasound.  No hydronephrosis. Electronically Signed   By: Jeannine Boga M.D.   On: 08/10/2019 23:59   Dg Chest Port 1 View  Result Date: 08/10/2019 CLINICAL DATA:  Weakness, loss of appetite EXAM: PORTABLE CHEST 1 VIEW COMPARISON:  10/02/2018 FINDINGS: Cardiomegaly. Both lungs are clear. The visualized skeletal structures are unremarkable. IMPRESSION: Cardiomegaly without acute abnormality of the lungs in AP portable projection. Electronically Signed   By: Eddie Candle M.D.   On: 08/10/2019 20:32    Pending Labs Unresulted Labs (From admission, onward)    Start     Ordered   08/12/19 0500  CBC with Differential/Platelet  Tomorrow morning,   R     08/11/19 1852   08/12/19 2409  Basic metabolic panel  Tomorrow morning,   R     08/11/19 1852   08/11/19 1547  Hemoglobin A1c  Tomorrow morning,   R    Comments: To assess prior glycemic control    08/11/19 1546   08/11/19 1539  Urine culture  Add-on,   AD     08/11/19 1538   08/10/19 2208  Culture, blood (routine x 2)  BLOOD CULTURE X 2,   R (with STAT occurrences)      08/10/19 2207   08/10/19 2208  Urea nitrogen, urine  Once,   STAT     08/10/19 2208   08/10/19 2018  Magnesium  Add-on,   AD     08/10/19 2017          Vitals/Pain Today's Vitals   08/11/19 2000 08/11/19 2030 08/11/19 2130 08/11/19 2200  BP: 106/70 129/70 (!) 160/100 130/75  Pulse: (!) 110 (!) 117 (!) 117 (!) 110  Resp: 17 19 20  (!) 30  Temp:      TempSrc:      SpO2: 95% 96% 95% 97%  PainSc:        Isolation Precautions No active isolations  Medications Medications  oxyCODONE-acetaminophen (PERCOCET/ROXICET) 5-325 MG per tablet 1-2 tablet (has no administration in time range)  mirtazapine (REMERON) tablet 7.5 mg (has no administration in time range)  latanoprost (XALATAN) 0.005 % ophthalmic solution 1 drop (has no administration in time range)  sodium chloride flush (NS) 0.9 % injection 3 mL (has no administration in time range)  sodium chloride flush (NS) 0.9 % injection 3 mL (has no administration in time range)  sodium chloride flush (NS) 0.9 % injection 3 mL (has no administration in time range)  0.9 %  sodium chloride infusion (has no administration in time range)  acetaminophen (TYLENOL) tablet 650 mg (has no administration in time range)    Or  acetaminophen (TYLENOL) suppository 650 mg (has no administration in time range)  ondansetron (ZOFRAN) tablet 4 mg (has no administration in time range)    Or  ondansetron (ZOFRAN) injection 4 mg (has no administration in time range)  insulin aspart (novoLOG) injection 0-9 Units (0 Units Subcutaneous Not Given 08/11/19 1637)  insulin aspart (novoLOG) injection 0-5 Units (0 Units Subcutaneous Not Given 08/11/19 2208)  albuterol (VENTOLIN HFA) 108 (90 Base) MCG/ACT inhaler 2 puff (has no administration in time range)  clopidogrel (PLAVIX) tablet 75 mg (75 mg Oral Given 08/11/19 1638)  heparin injection 5,000 Units (5,000 Units Subcutaneous Given 08/11/19 1639)  0.9 %  sodium chloride infusion ( Intravenous Paused 08/11/19  2143)  sodium chloride 0.9 % bolus 500 mL (0 mLs Intravenous Stopped 08/11/19 0202)  sodium chloride 0.9 % bolus 500 mL (0 mLs Intravenous Stopped 08/11/19 1949)    Mobility walks with person assist Moderate fall risk   Focused Assessments Cardiac Assessment Handoff:    Lab Results  Component Value Date   CKTOTAL 728 (H) 01/27/2015   CKMB 101.3 (HH) 01/27/2015   TROPONINI 0.04 ([place]) 03/23/2018   No results found for: DDIMER Does the Patient currently have chest pain? No     R Recommendations: See Admitting Provider Note  Report given to:   Additional Notes:

## 2019-08-11 NOTE — Progress Notes (Signed)
PROGRESS NOTE  Amanda [name] FXJ:[id number] DOB: Oct 15, 1941 DOA: 08/10/2019 PCP: [name], NP  Brief History   Amanda [name] is a 78 y.o. female with medical history significant for hepatic cirrhosis, coronary artery disease, history of CVA, insulin-dependent diabetes mellitus, chronic diastolic CHF, chronic kidney disease stage III, and polycythemia vera, now presenting to the emergency department with a few days of poor appetite, nausea, and lightheadedness.  Patient reports checking her blood pressure at home, finding it to be low, and called EMS who reports SBP of 80 in the field.  Patient denies any abdominal pain, vomiting, or diarrhea, but states that she has just not had an appetite and has not eaten much of anything in the past few days.  She denies any difficulty swallowing.  She also denies any fevers, chills, shortness of breath, cough, chest pain, abdominal pain, dysuria, or flank pain.  She reports continued adherence with all of her medications.  ED Course: Upon arrival to the ED, patient is found to be afebrile, saturating mid 90s on room air, and with blood pressure 89/48.  Chest x-ray is negative for acute cardiopulmonary disease.  Chemistry panel is notable for bicarbonate of 19, BUN 43, and creatinine 3.05, up from 1.68 earlier this month.  CBC features a stable chronic leukocytosis to 21,300.  Differential is pending.  COVID-19 testing is in process.  Patient was started on normal saline infusion in the emergency department.  The patient was roomed in the ED due to lack of a telemetry bed. Her blood pressures stabilized, but her PO intake has remained minimal per nursing. Her blood sugars remain marginal. She is being moved to a telemetry bed now. She has received a total of 1L IV NS.  Consultants  . None  Procedures  . None  Antibiotics   Anti-infectives (From admission, onward)   None    .  Subjective  The patient states that she is feeling better, and that she  wants to go home. However, she tells nursing that she is unable to stand or walk due to weakness.   Objective   Vitals:  Vitals:   08/11/19 1600 08/11/19 1636  BP: (!) 141/78 (!) 159/102  Pulse: 91 91  Resp: 20 (!) 23  Temp:    SpO2: 98% 96%   Exam:  Constitutional:  . The patient is awake, alert, and oriented x 3. No acute distress. Respiratory:  . No increased work of breathing. . No wheezes, rales, or rhonchi . No tactile fremitus Cardiovascular:  . Regular rate and rhythm . No murmurs, ectopy, or gallups. . No lateral PMI. No thrills. Abdomen:  . Abdomen is soft, non-tender, non-distended . No hernias, masses, or organomegaly . Normoactive bowel sounds.  Musculoskeletal:  . No cyanosis, clubbing, or edema Skin:  . No rashes, lesions, ulcers . palpation of skin: no induration or nodules Neurologic:  . CN 2-12 intact . Sensation all 4 extremities intact Psychiatric:  . Mental status o Mood, affect appropriate o Orientation to person, place, time  . judgment and insight appear intact  I have personally reviewed the following:   Today's Data  . Vitals, BMP, CBC  Micro Data  . Clood cultures x 2 have had no growth . Urine Culture has had no growth  Imaging  . CXR: No acute pathology . Renal Ultrasound: Normal  Scheduled Meds: . clopidogrel  75 mg Oral Daily  . heparin  5,000 Units Subcutaneous Q8H  . insulin aspart  0-5 Units Subcutaneous  QHS  . insulin aspart  0-9 Units Subcutaneous TID WC  . latanoprost  1 drop Both Eyes QHS  . mirtazapine  7.5 mg Oral QHS  . sodium chloride flush  3 mL Intravenous Q12H  . sodium chloride flush  3 mL Intravenous Q12H   Continuous Infusions: . sodium chloride      Principal Problem:   Hypotension due to hypovolemia Active Problems:   [name] (HCC)   Essential hypertension   Chronic diastolic CHF (congestive heart failure) (HCC)   Coronary artery disease involving native coronary artery   Hepatic  cirrhosis (HCC)   GAD (generalized anxiety disorder)   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HCC)   Type 2 diabetes mellitus with diabetic polyneuropathy (HCC)   History of CVA (cerebrovascular accident)   Acute renal failure superimposed on stage 3 chronic kidney disease (HCC)   LOS: 0 days   A & P   Hypotension; history of HTN: Blood pressures improved after IV fluids boluses. Orthostatics negative per nursing. Antihypertensives and diuretics have been held.   AKI on CKD III: Creatinine is down to 2.87 from 3 on admission. Baseline is 1.6-1.7. Continue cautious IV fluids. Avoid nephrotoxic medications, and avoid hypotension. Monitor creatinine, electrolytes, and volume status.    Leukocytosis: Noted and chronic. There are no other infectious s/s, no chest pain, no bleeding. No growth in blood cultures or urine cultures.Monitor.  Acute kidney injury superimposed on CKD III: Creatinine is 2.84 this morning down from 3.0 on admission. Continue cautious IV fluids and monitor creatinine, electrolytes, and volume status. Avoid nephrotoxic medications and hypotension.  Chronic diastolic CHF: EF was preserved on echo from June 2019 that demonstrated grade 1 diastolic dysfunction and moderate LAE. Monitor volume status. Lower extremity edema likely due to cirrhosis.  Cirrhosis: Possibly behind intravascular volume depletion and lower extremity edema. Pt cannot tolerate diuretics or spironolactone at this time.    Insulin-dependent DM: Blood sugars have been low. Will check HbA1c. Levemir has been held. Glucoses 60-96 in the last 24 hours with poor PO intake. Will check FSBS every 4 hours and follow PO intake.   CAD: No anginal complaints. Continue Plavix.  History of CVA: Continue Plavix. Avoid hypotension.  [name]: Counts appears stable. Monitor.  Generalilzed weakness: PT/OT eval and treat.  I have seen and examined this patient myself. I have spent 35 minutes  in her evaluation and care.  DVT prophylaxis: sq heparin   Code Status: DNR  Family Communication: Husband updated at bedside Disposition: Home   An Schnabel, DO [facility] Direct contact: see [email or web address]  7PM-7AM contact night coverage as above 08/11/2019, 5:03 PM  LOS: 0 days

## 2019-08-11 NOTE — ED Notes (Signed)
Pt assisted with utilizing tv and repositioned in the bed. Pt denies needing anything currently.

## 2019-08-11 NOTE — ED Notes (Signed)
Patient is awake and eating breakfast meal at this time. Additional protein and carbohydrates are being provided at this time for patient to consume.

## 2019-08-11 NOTE — ED Notes (Signed)
I attempted to walk patient in the room she took two steps and then stated that she could not walk and she needed to sit down.  I made the nurse aware.

## 2019-08-11 NOTE — ED Notes (Signed)
Pt requested to call her husband. NT assisted pt and dialed the number. Pt was observed talking on the phone when NT left the room.

## 2019-08-11 NOTE — ED Notes (Signed)
Pt called out wanting chicken noodle soup and crackers because she is a diabetic and hungry.  Could not find chicken noodle soup so pt was offered tomato soup, sandwiches and the spaghetti tray.  Pt refused everything presented to her.  She accepted some crackers but wants more than crackers.

## 2019-08-11 NOTE — ED Notes (Signed)
Patient ambulated to restroom with 4 point walker without complication.

## 2019-08-11 NOTE — ED Notes (Signed)
Urine culture sent to lab with urine sample.

## 2019-08-12 DIAGNOSIS — E861 Hypovolemia: Secondary | ICD-10-CM | POA: Diagnosis present

## 2019-08-12 DIAGNOSIS — Z66 Do not resuscitate: Secondary | ICD-10-CM | POA: Diagnosis present

## 2019-08-12 DIAGNOSIS — E1142 Type 2 diabetes mellitus with diabetic polyneuropathy: Secondary | ICD-10-CM | POA: Diagnosis present

## 2019-08-12 DIAGNOSIS — I2510 Atherosclerotic heart disease of native coronary artery without angina pectoris: Secondary | ICD-10-CM | POA: Diagnosis present

## 2019-08-12 DIAGNOSIS — Z20828 Contact with and (suspected) exposure to other viral communicable diseases: Secondary | ICD-10-CM | POA: Diagnosis present

## 2019-08-12 DIAGNOSIS — D45 Polycythemia vera: Secondary | ICD-10-CM | POA: Diagnosis present

## 2019-08-12 DIAGNOSIS — Z794 Long term (current) use of insulin: Secondary | ICD-10-CM | POA: Diagnosis not present

## 2019-08-12 DIAGNOSIS — F411 Generalized anxiety disorder: Secondary | ICD-10-CM | POA: Diagnosis present

## 2019-08-12 DIAGNOSIS — N1832 Chronic kidney disease, stage 3b: Secondary | ICD-10-CM | POA: Diagnosis present

## 2019-08-12 DIAGNOSIS — E11649 Type 2 diabetes mellitus with hypoglycemia without coma: Secondary | ICD-10-CM | POA: Diagnosis present

## 2019-08-12 DIAGNOSIS — I9589 Other hypotension: Secondary | ICD-10-CM | POA: Diagnosis present

## 2019-08-12 DIAGNOSIS — R627 Adult failure to thrive: Secondary | ICD-10-CM | POA: Diagnosis present

## 2019-08-12 DIAGNOSIS — N179 Acute kidney failure, unspecified: Secondary | ICD-10-CM | POA: Diagnosis present

## 2019-08-12 DIAGNOSIS — J418 Mixed simple and mucopurulent chronic bronchitis: Secondary | ICD-10-CM | POA: Diagnosis present

## 2019-08-12 DIAGNOSIS — M109 Gout, unspecified: Secondary | ICD-10-CM | POA: Diagnosis present

## 2019-08-12 DIAGNOSIS — F329 Major depressive disorder, single episode, unspecified: Secondary | ICD-10-CM | POA: Diagnosis present

## 2019-08-12 DIAGNOSIS — Z6838 Body mass index (BMI) 38.0-38.9, adult: Secondary | ICD-10-CM | POA: Diagnosis not present

## 2019-08-12 DIAGNOSIS — D72828 Other elevated white blood cell count: Secondary | ICD-10-CM | POA: Diagnosis present

## 2019-08-12 DIAGNOSIS — E669 Obesity, unspecified: Secondary | ICD-10-CM | POA: Diagnosis present

## 2019-08-12 DIAGNOSIS — E1122 Type 2 diabetes mellitus with diabetic chronic kidney disease: Secondary | ICD-10-CM | POA: Diagnosis present

## 2019-08-12 DIAGNOSIS — K7460 Unspecified cirrhosis of liver: Secondary | ICD-10-CM | POA: Diagnosis present

## 2019-08-12 DIAGNOSIS — I5032 Chronic diastolic (congestive) heart failure: Secondary | ICD-10-CM | POA: Diagnosis present

## 2019-08-12 DIAGNOSIS — I130 Hypertensive heart and chronic kidney disease with heart failure and stage 1 through stage 4 chronic kidney disease, or unspecified chronic kidney disease: Secondary | ICD-10-CM | POA: Diagnosis present

## 2019-08-12 DIAGNOSIS — I959 Hypotension, unspecified: Secondary | ICD-10-CM | POA: Diagnosis present

## 2019-08-12 DIAGNOSIS — E785 Hyperlipidemia, unspecified: Secondary | ICD-10-CM | POA: Diagnosis present

## 2019-08-12 LAB — GLUCOSE, CAPILLARY
Glucose-Capillary: 107 mg/dL — ABNORMAL HIGH (ref 70–99)
Glucose-Capillary: 135 mg/dL — ABNORMAL HIGH (ref 70–99)
Glucose-Capillary: 114 mg/dL — ABNORMAL HIGH (ref 70–99)
Glucose-Capillary: 156 mg/dL — ABNORMAL HIGH (ref 70–99)

## 2019-08-12 LAB — CBC WITH DIFFERENTIAL/PLATELET
Abs Immature Granulocytes: 0.85 10*3/uL — ABNORMAL HIGH (ref 0.00–0.07)
Basophils Absolute: 0.1 10*3/uL (ref 0.0–0.1)
Basophils Relative: 1 %
Eosinophils Absolute: 0 10*3/uL (ref 0.0–0.5)
Eosinophils Relative: 0 %
HCT: 38.1 % (ref 36.0–46.0)
Hemoglobin: 12 g/dL (ref 12.0–15.0)
Immature Granulocytes: 6 %
Lymphocytes Relative: 21 %
Lymphs Abs: 3.1 10*3/uL (ref 0.7–4.0)
MCH: 30.2 pg (ref 26.0–34.0)
MCHC: 31.5 g/dL (ref 30.0–36.0)
MCV: 95.7 fL (ref 80.0–100.0)
Monocytes Absolute: 1.2 10*3/uL — ABNORMAL HIGH (ref 0.1–1.0)
Monocytes Relative: 8 %
Neutro Abs: 10 10*3/uL — ABNORMAL HIGH (ref 1.7–7.7)
Neutrophils Relative %: 64 %
Platelets: 194 10*3/uL (ref 150–400)
RBC: 3.98 MIL/uL (ref 3.87–5.11)
RDW: 17.3 % — ABNORMAL HIGH (ref 11.5–15.5)
WBC: 15.3 10*3/uL — ABNORMAL HIGH (ref 4.0–10.5)
nRBC: 0.2 % (ref 0.0–0.2)

## 2019-08-12 LAB — BASIC METABOLIC PANEL
Anion gap: 10 (ref 5–15)
BUN: 39 mg/dL — ABNORMAL HIGH (ref 8–23)
CO2: 17 mmol/L — ABNORMAL LOW (ref 22–32)
Calcium: 8.9 mg/dL (ref 8.9–10.3)
Chloride: 109 mmol/L (ref 98–111)
Creatinine, Ser: 2.57 mg/dL — ABNORMAL HIGH (ref 0.44–1.00)
GFR calc Af Amer: 20 mL/min — ABNORMAL LOW (ref >60)
GFR calc non Af Amer: 17 mL/min — ABNORMAL LOW (ref >60)
Glucose, Bld: 100 mg/dL — ABNORMAL HIGH (ref 70–99)
Potassium: 5.6 mmol/L — ABNORMAL HIGH (ref 3.5–5.1)
Sodium: 136 mmol/L (ref 135–145)

## 2019-08-12 LAB — MRSA PCR SCREENING: MRSA by PCR: POSITIVE — AB

## 2019-08-12 LAB — HEMOGLOBIN A1C
Hgb A1c MFr Bld: 6.6 % — ABNORMAL HIGH (ref 4.8–5.6)
Mean Plasma Glucose: 142.72 mg/dL

## 2019-08-12 MED ORDER — MUPIROCIN 2 % EX OINT
1.0000 "application " | TOPICAL_OINTMENT | Freq: Two times a day (BID) | CUTANEOUS | Status: DC
  Administered 2019-08-12 – 2019-08-13 (×4): 1 via NASAL
  Filled 2019-08-12: qty 22

## 2019-08-12 MED ORDER — SODIUM ZIRCONIUM CYCLOSILICATE 5 G PO PACK
5.0000 g | PACK | Freq: Once | ORAL | Status: AC
  Administered 2019-08-12: 5 g via ORAL
  Filled 2019-08-12: qty 1

## 2019-08-12 MED ORDER — CHLORHEXIDINE GLUCONATE CLOTH 2 % EX PADS: 6.0000 | MEDICATED_PAD | Freq: Every day | CUTANEOUS | Status: DC

## 2019-08-12 NOTE — Progress Notes (Signed)
Pt does not want IVF going at this time(NS @ 100cc/hr ordered, however notes say to give "caustiously"). Pt says she is drinking enough and doesn't want fluids going. SBP-90s currently but has been 100s-130s. Pt said that if her BP drops again, she will allow IVF to be restarted. Will alert MD in AM.

## 2019-08-12 NOTE — TOC Progression Note (Signed)
Transition of Care [facility]) - Progression Note    Patient Details  Name: Amanda [name] MRN: [id number] Date of Birth: 08-06-1941  Transition of Care [facility]) CM/SW Contact  Joaquin Courts, RN Phone Number: 08/12/2019, 2:11 PM  Clinical Narrative:    CM spoke with patient. Patient set up with [facility] for [place]. Patient reports has no DME needs.   Expected Discharge Plan: [place] Barriers to Discharge: Continued Medical Work up  Expected Discharge Plan and Services Expected Discharge Plan: [place]   Discharge Planning Services: CM Consult Post Acute Care Choice: [place] arrangements for the past 2 months: Apartment                 DME Arranged: N/A DME Agency: NA       HH Arranged: PT HH Agency: [place] Date [place]: 08/12/19 Time [place]: 9 Representative spoke with at [place]: [place]) Interventions    Readmission Risk Interventions No flowsheet data found.

## 2019-08-12 NOTE — Progress Notes (Signed)
Patient's PIV malpositioned, patient stated it was bothering her, assessed and made decision to remove. IV team consulted for new PIV, as patient states last IV was placed by IV team and is a "difficult stick." Attempted to use vein finder available on department to assess for PIV placement by nursing staff but unsuccessful, no veins visualized.  IV team paged around [phone], awaiting return page.   Will continue to monitor.

## 2019-08-12 NOTE — Progress Notes (Signed)
Home health agencies that serve [phone].        [place] Quality of Patient Care Rating Patient Survey Summary Rating  [facility] [phone] 4 out of 5 stars 4 out of [place] [phone] 3 out of 5 stars 5 out of [place] [phone] 3 out of 5 stars 4 out of [place] [phone]  out of 5 stars 3 out of [place] [phone] 4 out of 5 stars 4 out of [place] [phone] 4 out of 5 stars 4 out of [place] [phone]  out of 5 stars 4 out of [place] [phone] 4 out of 5 stars 4 out of 5 stars  [facility] [phone] 3  out of 5 stars 4 out of [place] [phone] 3 out of 5 stars 4 out of 5 stars  [facility] [phone] 4 out of 5 stars Not Available12  [facility] [phone] 3  out of 5 stars 3 out of 5 stars  [facility] ([phone]) [phone] 3  out of 5 stars 4 out of [place] [phone] 3  out of 5 stars 4 out of [place] [phone] 3  out of 5 stars 3 out of [place] [phone] 3  out of 5 stars [place] [phone] 3  out of 5 stars 4 out of [place] [phone] 4  out of 5 stars 3 out of [place] [phone] 4  out of 5 stars 2 out of [place] number Footnote as displayed on [place]  1 This agency provides services under a federal waiver program to non-traditional, chronic long term population.  2 This agency provides services to a  special needs population.  3 Not Available.  4 The number of patient episodes for this measure is too small to report.  5 This measure currently does not have data or provider has been certified/recertified for less than 6 months.  6 The national average for this measure is not provided because of state-to-state differences in data collection.  7 Medicare is not displaying rates for this measure for any home health agency, because of an issue with the data.  8 There were problems with the data and they are being corrected.  9 Zero, or very few, patients met the survey's rules for inclusion. The scores shown, if any, reflect a very small number of surveys and may not accurately tell how an agency is doing.  10 Survey results are based on less than 12 months of data.  11 Fewer than 70 patients completed the survey. Use the scores shown, if any, with caution as the number of surveys may be too low to accurately tell how an agency is doing.  12 No survey results are available for this period.  13 Data suppressed by CMS  for one or more quarters.

## 2019-08-12 NOTE — Progress Notes (Addendum)
PROGRESS NOTE  BRUNILDA EBLE BTD:[id number] DOB: [birth year] DOA: 08/10/2019 PCP: [name], NP  Brief History   Amanda [name] is a 78 y.o. female with medical history significant for hepatic cirrhosis, coronary artery disease, history of CVA, insulin-dependent diabetes mellitus, chronic diastolic CHF, chronic kidney disease stage III, and polycythemia vera, now presenting to the emergency department with a few days of poor appetite, nausea, and lightheadedness.  Patient reports checking her blood pressure at home, finding it to be low, and called EMS who reports SBP of 80 in the field.  Patient denies any abdominal pain, vomiting, or diarrhea, but states that she has just not had an appetite and has not eaten much of anything in the past few days.  She denies any difficulty swallowing.  She also denies any fevers, chills, shortness of breath, cough, chest pain, abdominal pain, dysuria, or flank pain.  She reports continued adherence with all of her medications.   ED Course: Upon arrival to the ED, patient is found to be afebrile, saturating mid 90s on room air, and with blood pressure 89/48.  Chest x-ray is negative for acute cardiopulmonary disease.  Chemistry panel is notable for bicarbonate of 19, BUN 43, and creatinine 3.05, up from 1.68 earlier this month.  CBC features a stable chronic leukocytosis to 21,300.  Differential is pending.  COVID-19 testing is in process.  Patient was started on normal saline infusion in the emergency department.  The patient was roomed in the ED due to lack of a telemetry bed. Her blood pressures stabilized, but her PO intake has remained minimal per nursing. Her blood sugars remain marginal. She is being moved to a telemetry bed now. She has received a total of 1L IV NS.  The patient has been evaluated by PT/OT. Their recommendation is for SNF. However, the patient is refusing SNF.   Consultants  None  Procedures  None  Antibiotics   Anti-infectives  (From admission, onward)    None      Subjective  The patient states that she is feeling better. No new complaints. Objective   Vitals:  Vitals:   08/12/19 1200 08/12/19 1229  BP: 116/90   Pulse: 82   Resp: (!) 25   Temp:  98.5 F (36.9 C)  SpO2: 100%    Exam:  Constitutional:  The patient is awake, alert, and oriented x 3. No acute distress. Respiratory:  No increased work of breathing. No wheezes, rales, or rhonchi No tactile fremitus Cardiovascular:  Regular rate and rhythm No murmurs, ectopy, or gallups. No lateral PMI. No thrills. Abdomen:  Abdomen is soft, non-tender, non-distended No hernias, masses, or organomegaly Normoactive bowel sounds.  Musculoskeletal:  No cyanosis, clubbing, or edema Skin:  No rashes, lesions, ulcers palpation of skin: no induration or nodules Neurologic:  CN 2-12 intact Sensation all 4 extremities intact Psychiatric:  Mental status Mood, affect appropriate Orientation to person, place, time  judgment and insight appear intact  I have personally reviewed the following:   Today's Data  Vitals, Orthostatics, BMP, CBC  Micro Data  Blood cultures x 2 have had no growth Urine Culture has had no growth MRSA by PCR is positive  Imaging  CXR: No acute pathology Renal Ultrasound: Normal  Scheduled Meds:  Chlorhexidine Gluconate Cloth  6 each Topical Daily   Chlorhexidine Gluconate Cloth  6 each Topical Q0600   clopidogrel  75 mg Oral Daily   heparin  5,000 Units Subcutaneous Q8H   insulin aspart  0-5  Units Subcutaneous QHS   insulin aspart  0-9 Units Subcutaneous TID WC   latanoprost  1 drop Both Eyes QHS   mirtazapine  7.5 mg Oral QHS   mupirocin ointment  1 application Nasal BID   sodium chloride flush  3 mL Intravenous Q12H   sodium chloride flush  3 mL Intravenous Q12H   Continuous Infusions:  sodium chloride     sodium chloride 100 mL/hr at 08/12/19 1229    Principal Problem:   Hypotension due to  hypovolemia Active Problems:   [name] (HCC)   Essential hypertension   Chronic diastolic CHF (congestive heart failure) (HCC)   Coronary artery disease involving native coronary artery   Hepatic cirrhosis (HCC)   GAD (generalized anxiety disorder)   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HCC)   Type 2 diabetes mellitus with diabetic polyneuropathy (HCC)   History of CVA (cerebrovascular accident)   Acute renal failure superimposed on stage 3 chronic kidney disease ([place])   Hypotension   LOS: 0 days   A & P   Hypotension; history of HTN: Blood pressures improved after IV fluids boluses. Orthostatics are negative this morning. Antihypertensives and diuretics have been held.   AKI on CKD IIIb: Creatinine is down to 2.57 from 3 on admission. Baseline is 1.6-1.7. Continue cautious IV fluids. Avoid nephrotoxic medications, and avoid hypotension. Monitor creatinine, electrolytes, and volume status. Would hold discharge for creatinine less than 2.0.   Leukocytosis: Noted and chronic. There are no other infectious s/s, no chest pain, no bleeding. No growth in blood cultures or urine cultures. Monitor.  Chronic diastolic CHF: EF was preserved on echo from June 2019 that demonstrated grade 1 diastolic dysfunction and moderate LAE. Monitor volume status. Lower extremity edema likely due to cirrhosis.   Cirrhosis: Possibly behind intravascular volume depletion and lower extremity edema. Pt cannot tolerate diuretics or spironolactone at this time.     Insulin-dependent DM: Blood sugars have been low. Will check HbA1c. Levemir has been held. Glucoses 60-96 in the last 24 hours with poor PO intake. Will check FSBS q ac and hs and follow PO intake.   Failure to thrive: Poor PO intake and progressive weakness. PT recommendation is for SNF placement. She is not deemed safe for discharge to home.   CAD: No anginal complaints. Continue Plavix.   History of CVA: Continue Plavix.  Avoid hypotension.   [name]: Counts appears stable. Monitor.  Generalilzed weakness: PT/OT eval and treat.  I have seen and examined this patient myself. I have spent 35 minutes in her evaluation and care. Unable to discharge. This would be unsafe given the patient's slow to resolve acute kidney injury.   DVT prophylaxis: sq heparin   Code Status: DNR  Family Communication: Husband at bedside Disposition: Home with home health. Barrier to discharge is acute on chronic renal insufficiency.  Nazifa Trinka, DO [facility] Direct contact: see [email or web address]  7PM-7AM contact night coverage as above 08/12/2019, 3:49 PM  LOS: 0 days

## 2019-08-12 NOTE — Plan of Care (Signed)
  Problem: Clinical Measurements: Goal: Diagnostic test results will improve Outcome: Progressing Goal: Cardiovascular complication will be avoided Outcome: Progressing

## 2019-08-12 NOTE — Progress Notes (Signed)
CRITICAL VALUE ALERT  Critical Value:  + MRSA nasal swab  Date & Time Notied:  08/12/2019 @ 0130  Provider Notified: per protocol     Orders Received/Actions taken: per protocol

## 2019-08-12 NOTE — Evaluation (Signed)
Physical Therapy Evaluation Patient Details Name: Amanda [name] MRN: [id number] DOB: 30-Mar-1941 Today's Date: 08/12/2019   History of Present Illness  78 yo female admitted with hypotension, hypovolemia, AKI. Hx of hepatic cirrhosis, CAD, CVA, gout, MI, NM d/o, falls, DM, CHF, CKD, polycythemia vera, obesity, anemia  Clinical Impression  On eval, pt required Min-Mod assist for mobility. She walked ~20 feet in the room with use of a RW. Pt presents with general weakness, decreased activity tolerance, and impaired gait and balance. She fatigues quickly with minimal activity. Dyspnea 2/4, O2 89% on RA during session. She is fearful of falling. Discussed d/c plan with pt and husband-they are agreeable to discussing placement with CSW. Recommend SNF for ST rehab if pt and husband ultimately agree to placement. If pt chooses to return home, recommend HHPT and 24 hour supervision/assist. Will continue to follow during hospital stay.     Follow Up Recommendations SNF    Equipment Recommendations  None recommended by PT    Recommendations for Other Services       Precautions / Restrictions Precautions Precautions: Fall Precaution Comments: monitor O2 Restrictions Weight Bearing Restrictions: No      Mobility  Bed Mobility Overal bed mobility: Needs Assistance Bed Mobility: Supine to Sit     Supine to sit: Mod assist;HOB elevated     General bed mobility comments: Assist to scoot to EOB. Increased time. Heavy reliance on bedrail.  Transfers Overall transfer level: Needs assistance Equipment used: Rolling walker (2 wheeled) Transfers: Sit to/from Stand Sit to Stand: Min assist;From elevated surface         General transfer comment: x 2. VCs safety, technique, hand placement. Pt fatigues easily/quickly. Assist to rise, stabilize, control descent. Pt attempts to sit before safely positioned in front of chair.  Ambulation/Gait Ambulation/Gait assistance: Min assist; +2  safety/equipment Gait Distance (Feet): 20 Feet Assistive device: Rolling walker (2 wheeled) Gait Pattern/deviations: Step-through pattern;Decreased stride length     General Gait Details: Slow, effortful gait speed. 1 brief standing rest break after ~10 feet. dyspnea 2/4. O2 89% on RA. Pt fatigues quickly/easily.  Stairs            Wheelchair Mobility    Modified Rankin (Stroke Patients Only)       Balance Overall balance assessment: Needs assistance;History of Falls         Standing balance support: Bilateral upper extremity supported Standing balance-Leahy Scale: Poor                               Pertinent Vitals/Pain Pain Assessment: Faces Faces Pain Scale: Hurts little more Pain Location: genital area from purewick Pain Descriptors / Indicators: Burning Pain Intervention(s): Limited activity within patient's tolerance    Home Living Family/patient expects to be discharged to:: Unsure Living Arrangements: Spouse/significant other Available Help at Discharge: Family;Available PRN/intermittently Type of Home: Apartment Home Access: Level entry     Home Layout: One level Home Equipment: Walker - 2 wheels;Walker - 4 wheels;Cane - single point      Prior Function Level of Independence: Independent with assistive device(s)         Comments: has been using rollator for ambulation.     Hand Dominance        Extremity/Trunk Assessment   Upper Extremity Assessment Upper Extremity Assessment: Defer to OT evaluation    Lower Extremity Assessment Lower Extremity Assessment: Generalized weakness    Cervical / Trunk Assessment  Cervical / Trunk Assessment: Normal  Communication   Communication: No difficulties  Cognition Arousal/Alertness: Awake/alert Behavior During Therapy: WFL for tasks assessed/performed Overall Cognitive Status: Within Functional Limits for tasks assessed                                         General Comments      Exercises     Assessment/Plan    PT Assessment Patient needs continued PT services  PT Problem List Decreased strength;Decreased mobility;Obesity;Decreased activity tolerance;Decreased balance;Decreased knowledge of use of DME       PT Treatment Interventions DME instruction;Gait training;Therapeutic exercise;Therapeutic activities;Patient/family education;Functional mobility training;Balance training    PT Goals (Current goals can be found in the Care Plan section)  Acute Rehab PT Goals Patient Stated Goal: to go home PT Goal Formulation: With patient/family Time For Goal Achievement: 08/26/19 Potential to Achieve Goals: Good    Frequency Min 3X/week   Barriers to discharge        Co-evaluation               AM-PAC PT "6 Clicks" Mobility  Outcome Measure Help needed turning from your back to your side while in a flat bed without using bedrails?: A Little Help needed moving from lying on your back to sitting on the side of a flat bed without using bedrails?: A Little Help needed moving to and from a bed to a chair (including a wheelchair)?: A Little Help needed standing up from a chair using your arms (e.g., wheelchair or bedside chair)?: A Little Help needed to walk in hospital room?: A Lot Help needed climbing 3-5 steps with a railing? : A Lot 6 Click Score: 16    End of Session Equipment Utilized During Treatment: Gait belt Activity Tolerance: Patient limited by fatigue Patient left: in chair;with call bell/phone within reach;with chair alarm set;with family/visitor present   PT Visit Diagnosis: History of falling (Z91.81);Muscle weakness (generalized) (M62.81);Difficulty in walking, not elsewhere classified (R26.2)    Time: 5035-4656 PT Time Calculation (min) (ACUTE ONLY): 35 min   Charges:   PT Evaluation $PT Eval Moderate Complexity: 1 Mod PT Treatments $Gait Training: 8-22 mins        Weston Anna, PT Acute  Rehabilitation Services Pager: [phone] Office: [phone]

## 2019-08-13 ENCOUNTER — Telehealth: Payer: Self-pay | Admitting: Adult Health

## 2019-08-13 ENCOUNTER — Telehealth: Payer: Self-pay | Admitting: *Deleted

## 2019-08-13 DIAGNOSIS — E861 Hypovolemia: Secondary | ICD-10-CM | POA: Diagnosis not present

## 2019-08-13 DIAGNOSIS — I9589 Other hypotension: Secondary | ICD-10-CM | POA: Diagnosis not present

## 2019-08-13 LAB — GLUCOSE, CAPILLARY: Glucose-Capillary: 140 mg/dL — ABNORMAL HIGH (ref 70–99)

## 2019-08-13 LAB — URINE CULTURE

## 2019-08-13 LAB — BASIC METABOLIC PANEL
Anion gap: 9 (ref 5–15)
BUN: 27 mg/dL — ABNORMAL HIGH (ref 8–23)
CO2: 16 mmol/L — ABNORMAL LOW (ref 22–32)
Calcium: 8.6 mg/dL — ABNORMAL LOW (ref 8.9–10.3)
Chloride: 112 mmol/L — ABNORMAL HIGH (ref 98–111)
Creatinine, Ser: 1.78 mg/dL — ABNORMAL HIGH (ref 0.44–1.00)
GFR calc Af Amer: 31 mL/min — ABNORMAL LOW (ref >60)
GFR calc non Af Amer: 27 mL/min — ABNORMAL LOW (ref >60)
Glucose, Bld: 111 mg/dL — ABNORMAL HIGH (ref 70–99)
Potassium: 4.8 mmol/L (ref 3.5–5.1)
Sodium: 137 mmol/L (ref 135–145)

## 2019-08-13 LAB — UREA NITROGEN, URINE: Urea Nitrogen, Ur: 313 mg/dL

## 2019-08-13 MED FILL — JAKAFI 25 MG TABLET: 25 | 30 days supply | Qty: 60 | Fill #6

## 2019-08-13 NOTE — Telephone Encounter (Signed)
Call placed back to patient's husband, Amanda [name] to notify him that Dr. [name] does not feel as though Amanda [name] is causing an elevation in patient's creatinine.  Pt.'s husband appreciative of call back and would like to know if he should keep appt that he made this morning for this Friday, or come back as scheduled on 11/

## 2019-08-13 NOTE — Discharge Summary (Signed)
Physician Discharge Summary  Amanda [name] HFW:[id number] DOB: 1941-02-24 DOA: 08/10/2019  PCP: [name], NP  Admit date: 08/10/2019 Discharge date: 08/13/2019  Admitted From: Home Disposition: Home with Keys; refused SNF  Recommendations for Outpatient Follow-up:  1. Follow up with PCP in 1 week 2. Please obtain BMP/CBC in one week 3. Discontinued home Trulicity/Humulin R [id number] secondary to hypoglycemic episodes in setting of poor oral intake 4. Discontinued home losartan and spironolactone due to hypotension 5. Continue to monitor blood sugar closely and blood pressure  Home Health: Yes, PT Equipment/Devices: None  Discharge Condition: Stable CODE STATUS: DNR Diet recommendation: Heart healthy, carbohydrate modified  History of present illness:  Amanda [name] a 78 y.o.femalewith medical history significant forhepatic cirrhosis, coronary artery disease, history of CVA, insulin-dependent diabetes mellitus, chronic diastolic CHF, chronic kidney disease stage III, and polycythemia vera, now presenting to the emergency department with a few days of poor appetite, nausea, and lightheadedness. Patient reports checking her blood pressure at home, finding it to be low, and called EMS who reports SBP of 80 in the field. Patient denies any abdominal pain, vomiting, or diarrhea, but states that she has just not had an appetite and has not eaten much of anything in the past few days. She denies any difficulty swallowing. She also denies any fevers, chills, shortness of breath, cough, chest pain, abdominal pain, dysuria, or flank pain. She reports continued adherence with all of her medications.  ED Course:Upon arrival to the ED, patient is found to be afebrile, saturating mid 90s on room air, and with blood pressure 89/48. Chest x-ray is negative for acute cardiopulmonary disease. Chemistry panel is notable for bicarbonate of 19, BUN 43, and creatinine 3.05, up from 1.68  earlier this month. CBC features a stable chronic leukocytosis to 21,300. Differential is pending. COVID-19 testing is in process. Patient was started on normal saline infusion in the emergency department.  The patient was roomed in the ED due to lack of a telemetry bed. Her blood pressures stabilized, but her PO intake has remained minimal per nursing. Her blood sugars remain marginal. She is being moved to a telemetry bed now. She has received a total of 1L IV NS.  The patient has been evaluated by PT/OT. Their recommendation is for SNF. However, the patient is refusing SNF.   Hospital course:  Hypotension: History of essential HTN:  Blood pressures improved after IV fluids boluses.   Will discontinue home spironolactone and losartan.  Continue furosemide for underlying cirrhosis.  Will need follow-up with PCP for further monitoring of her blood pressures.  AKI on CKD III:  Creatinine is down to 1.78 from 3 on admission. Baseline is 1.6-1.7.  Renal ultrasound with no acute findings, no hydronephrosis.  Received IV fluid hydration while inpatient.  Discontinued home losartan and spironolactone.  Recommend repeat BMP in 1 week at PCP follow-up visit.  Leukocytosis: Noted and chronic. There are no other infectious s/s, no chest pain, no bleeding. No growth in blood cultures or urine cultures.  Recommend repeat CBC in 1 week.  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  Chronic diastolic CHF: EF was preserved on echo from June 2019 that demonstrated grade 1 diastolic dysfunction and moderate LAE. Monitor volume status. Lower extremity edema likely due to cirrhosis.  Continue furosemide, discontinued Spironolactone.  Cirrhosis:  Possibly behind intravascular volume depletion and lower extremity edema.  Spironolactone discontinued, continue home furosemide.  Continue to monitor blood pressures closely, as it may not be able  to tolerate furosemide in the  future.  Insulin-dependent DM:  Hemoglobin A1c 6.6. Patient with significant hypoglycemic events on presentation.  Will discontinue home Trulicity and insulin.  Patient has been maintained on diet control while inpatient.  Follow-up with PCP outpatient for further direction and treatment options.  Failure to thrive:  Poor PO intake and progressive weakness. PT recommendation is for SNF placement; although patient declines.  States she does not like hospital food.  Encourage increase oral intake outpatient with consistent carbohydrate intake.  CAD: No anginal complaints. Continue Plavix.  History of CVA: Continue Plavix.   [name]: Counts appears stable. Monitor.  Generalilzed weakness: PT recommends SNF, patient declines.  Will discharge home with home health PT.  Discharge Diagnoses:  Principal Problem:   Hypotension due to hypovolemia Active Problems:   [name] (HCC)   Essential hypertension   Chronic diastolic CHF (congestive heart failure) (HCC)   Coronary artery disease involving native coronary artery   Hepatic cirrhosis (HCC)   GAD (generalized anxiety disorder)   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HCC)   Type 2 diabetes mellitus with diabetic polyneuropathy (HCC)   History of CVA (cerebrovascular accident)   Acute renal failure superimposed on stage 3 chronic kidney disease [facility])    Discharge Instructions  Discharge Instructions    Activity as tolerated - No restrictions   Complete by: As directed    Call MD for:  difficulty breathing, headache or visual disturbances   Complete by: As directed    Call MD for:  extreme fatigue   Complete by: As directed    Diet - low sodium heart healthy   Complete by: As directed    Diet - low sodium heart healthy   Complete by: As directed    Discharge instructions   Complete by: As directed    Follow up with PCP in 7-10 days. Home health PT/OT.   Increase activity slowly   Complete  by: As directed    Increase activity slowly   Complete by: As directed      Allergies as of 08/13/2019      Reactions   Penicillins Swelling   Has patient had a PCN reaction causing immediate rash, facial/tongue/throat swelling, SOB or lightheadedness with hypotension: No Has patient had a PCN reaction causing severe rash involving mucus membranes or skin necrosis: No Has patient had a PCN reaction that required hospitalization: No Has patient had a PCN reaction occurring within the last 10 years: No If all of the above answers are "NO", then may proceed with Cephalosporin use.   Lisinopril Cough   Tape Hives   Doxycycline Hives, Swelling, Rash   Latex Hives, Itching, Rash      Medication List    STOP taking these medications   amLODipine 5 MG tablet Commonly known as: NORVASC   BD Pen Needle Nano U/F 32G X 4 MM Misc Generic drug: Insulin Pen Needle   Dulaglutide 1.5 MG/0.5ML Sopn Commonly known as: Trulicity   insulin regular human CONCENTRATED 500 UNIT/ML kwikpen Commonly known as: HumuLIN R U-500 KwikPen   losartan 100 MG tablet Commonly known as: COZAAR   spironolactone 100 MG tablet Commonly known as: Aldactone   spironolactone 50 MG tablet Commonly known as: Aldactone     TAKE these medications   Accu-Chek Guide w/Device Kit 1 kit by Does not apply route as directed.   acetaminophen 325 MG tablet Commonly known as: TYLENOL Take 2 tablets (650 mg total) by mouth every 6 (  six) hours as needed for mild pain (or Fever >/= 101).   clopidogrel 75 MG tablet Commonly known as: PLAVIX TAKE 1 TABLET(75 MG) BY MOUTH DAILY What changed: See the new instructions.   ezetimibe 10 MG tablet Commonly known as: ZETIA TAKE 1 TABLET(10 MG) BY MOUTH DAILY What changed: See the new instructions.   furosemide 40 MG tablet Commonly known as: LASIX Take 40 mg by mouth daily. Take along with 20 mg tablet What changed: Another medication with the same name was removed.  Continue taking this medication, and follow the directions you see here.   glucose blood test strip Commonly known as: Accu-Chek Guide Use to check blood sugars 3 times daily   OneTouch Verio test strip Generic drug: glucose blood USE TO TEST BLOOD SUGAR THREE TIMES DAILY   Jakafi 25 MG tablet Generic drug: ruxolitinib phosphate TAKE 1 TABLET (25 MG TOTAL) BY MOUTH 2 (TWO) TIMES DAILY. What changed: See the new instructions.   latanoprost 0.005 % ophthalmic solution Commonly known as: XALATAN Place 1 drop into both eyes at bedtime.   mirtazapine 7.5 MG tablet Commonly known as: REMERON TAKE 1 TABLET(7.5 MG) BY MOUTH AT BEDTIME What changed: See the new instructions.   oxyCODONE-acetaminophen 5-325 MG tablet Commonly known as: PERCOCET/ROXICET Take 1-2 tablets by mouth every 8 (eight) hours as needed for severe pain.   potassium chloride 10 MEQ tablet Commonly known as: Klor-Con 10 Take 1 tablet (10 mEq total) by mouth daily.   promethazine 25 MG tablet Commonly known as: PHENERGAN Take 0.5 tablets (12.5 mg total) by mouth every 6 (six) hours as needed for nausea or vomiting.      Follow-up Information    [facility] Follow up.   Specialty: Home Health Services Why: agency will provide home health physical therapy. agency will call you to schedule first visit.  Contact information: [place] [address] [phone]        [name], NP. Schedule an appointment as soon as possible for a visit in 1 week(s).   Specialty: Family Medicine Contact information: [place] [address] [phone]          Allergies  Allergen Reactions  . Penicillins Swelling    Has patient had a PCN reaction causing immediate rash, facial/tongue/throat swelling, SOB or lightheadedness with hypotension: No Has patient had a PCN reaction causing severe rash involving mucus membranes or skin necrosis: No Has  patient had a PCN reaction that required hospitalization: No Has patient had a PCN reaction occurring within the last 10 years: No If all of the above answers are "NO", then may proceed with Cephalosporin use.  Marland Kitchen Lisinopril Cough  . Tape Hives  . Doxycycline Hives, Swelling and Rash  . Latex Hives, Itching and Rash    Consultations:  None   Procedures/Studies: US Renal  Result Date: 08/10/2019 CLINICAL DATA:  Initial evaluation for acute renal failure. EXAM: RENAL / URINARY TRACT ULTRASOUND COMPLETE COMPARISON:  Prior CT from 05/23/2018. FINDINGS: Right Kidney: Renal measurements: 10.0 x 4.1 x 5.3 cm = volume: 115 mL . Echogenicity within normal limits. No mass or hydronephrosis visualized. Left Kidney: Renal measurements: 10.4 x 4.7 x 5.6 cm = volume: 143 mL. Echogenicity within normal limits. No mass or hydronephrosis visualized. Bladder: Appears normal for degree of bladder distention. Other: None. IMPRESSION: Normal renal ultrasound.  No hydronephrosis. Electronically Signed   By: Jeannine Boga M.D.   On: 08/10/2019 23:59  Dg Chest Port 1 View  Result Date: 08/10/2019 CLINICAL DATA:  Weakness, loss of appetite EXAM: PORTABLE CHEST 1 VIEW COMPARISON:  10/02/2018 FINDINGS: Cardiomegaly. Both lungs are clear. The visualized skeletal structures are unremarkable. IMPRESSION: Cardiomegaly without acute abnormality of the lungs in AP portable projection. Electronically Signed   By: Eddie Candle M.D.   On: 08/10/2019 20:32    Subjective: Patient seen and examined at bedside, resting comfortably.  Requesting to be discharged home "now".  States has been able to ambulate in the hospital room without any recurrence of dizziness.  Blood pressure has improved and blood sugars now have normalized on diet control without any significant insulin requirements.  Patient without any complaints or concerns at this time.  Denies headache, no fever/chills/night sweats, no nausea cefonicid diarrhea,  no chest pain, no palpitations, no shortness of breath, no abdominal pain, no weakness, no fatigue, no paresthesias.  No acute events overnight per nursing staff.   Discharge Exam: Vitals:   08/13/19 0700 08/13/19 0730  BP: (!) 144/79   Pulse: (!) 115   Resp: 18   Temp:  98.7 F (37.1 C)  SpO2: 94%    Vitals:   08/13/19 0500 08/13/19 0600 08/13/19 0700 08/13/19 0730  BP: (!) 96/54 (!) 143/64 (!) 144/79   Pulse: 97 94 (!) 115   Resp: 18 15 18    Temp:    98.7 F (37.1 C)  TempSrc:    Oral  SpO2: 91% 90% 94%   Weight: 102.6 kg     Height:        General: Pt is alert, awake, not in acute distress Cardiovascular: RRR, S1/S2 +, no rubs, no gallops Respiratory: CTA bilaterally, no wheezing, no rhonchi Abdominal: Soft, NT, ND, bowel sounds + Extremities: Trace nonpitting bilateral lower extremity edema, no cyanosis    The results of significant diagnostics from this hospitalization (including imaging, microbiology, ancillary and laboratory) are listed below for reference.     Microbiology: Recent Results (from the past 240 hour(s))  SARS CORONAVIRUS 2 (TAT 6-24 HRS) Nasopharyngeal Nasopharyngeal Swab     Status: None   Collection Time: 08/10/19  8:55 PM   Specimen: Nasopharyngeal Swab  Result Value Ref Range Status   SARS Coronavirus 2 NEGATIVE NEGATIVE Final    Comment: (NOTE) SARS-CoV-2 target nucleic acids are NOT DETECTED. The SARS-CoV-2 RNA is generally detectable in upper and lower respiratory specimens during the acute phase of infection. Negative results do not preclude SARS-CoV-2 infection, do not rule out co-infections with other pathogens, and should not be used as the sole basis for treatment or other patient management decisions. Negative results must be combined with clinical observations, patient history, and epidemiological information. The expected result is Negative. Fact Sheet for Patients: SugarRoll.be Fact Sheet for  Healthcare Providers: https://www.woods-mathews.com/ This test is not yet approved or cleared by the [place] FDA and  has been authorized for detection and/or diagnosis of SARS-CoV-2 by FDA under an Emergency Use Authorization (EUA). This EUA will remain  in effect (meaning this test can be used) for the duration of the COVID-19 declaration under Section 56 4(b)(1) of the Act, 21 U.S.C. section 360bbb-3(b)(1), unless the authorization is terminated or revoked sooner. Performed at [facility], [place] [address] [place], [place] [address]   Culture, blood (routine x 2)     Status: None (Preliminary result)   Collection Time: 08/10/19 10:08 PM   Specimen: BLOOD LEFT FOREARM  Result Value Ref Range Status   Specimen  Description   Final    BLOOD LEFT FOREARM Performed at [facility], [place] [address], [place], [place] [address]    Special Requests   Final    BOTTLES DRAWN AEROBIC AND ANAEROBIC Blood Culture adequate volume Performed at [place] [address], [place], [place] [address]    Culture   Final    NO GROWTH 2 DAYS Performed at [place] [address], [place], [place] [address]    Report Status PENDING  Incomplete  Urine culture     Status: Abnormal   Collection Time: 08/11/19  3:39 PM   Specimen: Urine, Random  Result Value Ref Range Status   Specimen Description   Final    URINE, RANDOM Performed at [place] [address] [place], [place] [address]    Special Requests   Final    NONE Performed at [facility], [place] [address] [place], [place] [address]    Culture MULTIPLE SPECIES PRESENT, SUGGEST RECOLLECTION (A)  Final   Report Status 08/13/2019 FINAL  Final  MRSA PCR Screening     Status: Abnormal   Collection Time: 08/11/19 11:22 PM   Specimen: Nasal Mucosa; Nasopharyngeal  Result Value Ref Range Status   MRSA by PCR POSITIVE (A) NEGATIVE Final     Comment:        The GeneXpert MRSA Assay (FDA approved for NASAL specimens only), is one component of a comprehensive MRSA colonization surveillance program. It is not intended to diagnose MRSA infection nor to guide or monitor treatment for MRSA infections. RESULT CALLED TO, READ BACK BY AND VERIFIED WITH: SYQUIA,I AT 0130 ON 08/12/2019 BY MOSLEY,J Performed at [facility], [place] [address] [place],  [address]      Labs: BNP (last 3 results) Recent Labs    08/10/19 1906  BNP 67.1   Basic Metabolic Panel: Recent Labs  Lab 08/10/19 1906 08/11/19 1240 08/12/19 0223 08/13/19 0641  NA 136 135 136 137  K 5.1 5.0 5.6* 4.8  CL 105 107 109 112*  CO2 19* 19* 17* 16*  GLUCOSE 78 106* 100* 111*  BUN 43* 44* 39* 27*  CREATININE 3.05* 2.84* 2.57* 1.78*  CALCIUM 9.6 9.0 8.9 8.6*   Liver Function Tests: Recent Labs  Lab 08/10/19 1906  AST 20  ALT 11  ALKPHOS 63  BILITOT 1.1  PROT 8.0  ALBUMIN 4.6   No results for input(s): LIPASE, AMYLASE in the last 168 hours. No results for input(s): AMMONIA in the last 168 hours. CBC: Recent Labs  Lab 08/10/19 1906 08/11/19 1630 08/12/19 0223  WBC 21.3* 16.9* 15.3*  NEUTROABS 11.5* 10.8* 10.0*  HGB 13.5 12.3 12.0  HCT 42.3 38.4 38.1  MCV 96.1 95.3 95.7  PLT 263 210 194   Cardiac Enzymes: No results for input(s): CKTOTAL, CKMB, CKMBINDEX, TROPONINI in the last 168 hours. BNP: Invalid input(s): POCBNP CBG: Recent Labs  Lab 08/12/19 0746 08/12/19 1156 08/12/19 1649 08/12/19 2124 08/13/19 0741  GLUCAP 107* 135* 114* 156* 140*   D-Dimer No results for input(s): DDIMER in the last 72 hours. Hgb A1c Recent Labs    08/11/19 1630  HGBA1C 6.6*   Lipid Profile No results for input(s): CHOL, HDL, LDLCALC, TRIG, CHOLHDL, LDLDIRECT in the last 72 hours. Thyroid function studies No results for input(s): TSH, T4TOTAL, T3FREE, THYROIDAB in the last 72 hours.  Invalid input(s): FREET3 Anemia  work up No results for input(s): VITAMINB12, FOLATE,  FERRITIN, TIBC, IRON, RETICCTPCT in the last 72 hours. Urinalysis    Component Value Date/Time   COLORURINE YELLOW 08/10/2019 2008   APPEARANCEUR CLEAR 08/10/2019 2008   LABSPEC 1.008 08/10/2019 2008   PHURINE 5.0 08/10/2019 2008   GLUCOSEU NEGATIVE 08/10/2019 2008   GLUCOSEU NEGATIVE 01/10/2018 1217   HGBUR SMALL (A) 08/10/2019 2008   HGBUR large 05/07/2010 0845   BILIRUBINUR NEGATIVE 08/10/2019 2008   BILIRUBINUR neg 01/26/2016 1003   [place] 08/10/2019 2008   PROTEINUR NEGATIVE 08/10/2019 2008   UROBILINOGEN 0.2 01/10/2018 1217   NITRITE NEGATIVE 08/10/2019 2008   LEUKOCYTESUR TRACE (A) 08/10/2019 2008   Sepsis Labs Invalid input(s): PROCALCITONIN,  WBC,  LACTICIDVEN Microbiology Recent Results (from the past 240 hour(s))  SARS CORONAVIRUS 2 (TAT 6-24 HRS) Nasopharyngeal Nasopharyngeal Swab     Status: None   Collection Time: 08/10/19  8:55 PM   Specimen: Nasopharyngeal Swab  Result Value Ref Range Status   SARS Coronavirus 2 NEGATIVE NEGATIVE Final    Comment: (NOTE) SARS-CoV-2 target nucleic acids are NOT DETECTED. The SARS-CoV-2 RNA is generally detectable in upper and lower respiratory specimens during the acute phase of infection. Negative results do not preclude SARS-CoV-2 infection, do not rule out co-infections with other pathogens, and should not be used as the sole basis for treatment or other patient management decisions. Negative results must be combined with clinical observations, patient history, and epidemiological information. The expected result is Negative. Fact Sheet for Patients: SugarRoll.be Fact Sheet for Healthcare Providers: https://www.woods-mathews.com/ This test is not yet approved or cleared by the [place] FDA and  has been authorized for detection and/or diagnosis of SARS-CoV-2 by FDA under an Emergency Use Authorization (EUA).  This EUA will remain  in effect (meaning this test can be used) for the duration of the COVID-19 declaration under Section 56 4(b)(1) of the Act, 21 U.S.C. section 360bbb-3(b)(1), unless the authorization is terminated or revoked sooner. Performed at [facility], [place] [address], [place], [place] [address]   Culture, blood (routine x 2)     Status: None (Preliminary result)   Collection Time: 08/10/19 10:08 PM   Specimen: BLOOD LEFT FOREARM  Result Value Ref Range Status   Specimen Description   Final    BLOOD LEFT FOREARM Performed at [facility], [place] [address], [place], [place] [address]    Special Requests   Final    BOTTLES DRAWN AEROBIC AND ANAEROBIC Blood Culture adequate volume Performed at [place] [address], [place], [place] [address]    Culture   Final    NO GROWTH 2 DAYS Performed at [place] [address], [place], [place] [address]    Report Status PENDING  Incomplete  Urine culture     Status: Abnormal   Collection Time: 08/11/19  3:39 PM   Specimen: Urine, Random  Result Value Ref Range Status   Specimen Description   Final    URINE, RANDOM Performed at [place] [address], [place], [place] [address]    Special Requests   Final    NONE Performed at [facility], [place] [address] [place],  [address]    Culture MULTIPLE SPECIES PRESENT, SUGGEST RECOLLECTION (A)  Final   Report Status 08/13/2019 FINAL  Final  MRSA PCR Screening     Status: Abnormal   Collection Time: 08/11/19 11:22 PM   Specimen: Nasal Mucosa; Nasopharyngeal  Result Value Ref Range Status  MRSA by PCR POSITIVE (A) NEGATIVE Final    Comment:        The GeneXpert MRSA Assay (FDA approved for NASAL specimens only), is one component of a comprehensive MRSA colonization surveillance program. It is not intended to diagnose MRSA infection nor to guide or monitor treatment for MRSA  infections. RESULT CALLED TO, READ BACK BY AND VERIFIED WITH: SYQUIA,I AT 0130 ON 08/12/2019 BY MOSLEY,J Performed at [facility], [place] [address], [place], [place] [address]      Time coordinating discharge: Over 30 minutes  SIGNED:   Valari Taylor J [place] DO  [facility] 08/13/2019, 8:45 AM

## 2019-08-13 NOTE — Telephone Encounter (Signed)
Call placed back to patient's husband, [name] to inform him that Dr. [name] would like for pt to keep scheduled appt on Friday, 08/17/19.  [name] is appreciative of call and has no questions or concerns at this time.

## 2019-08-13 NOTE — Progress Notes (Signed)
Nutrition Brief Note  Patient identified for consult for Calorie Count.  Wt Readings from Last 15 Encounters:  08/13/19 102.6 kg  07/09/19 101.6 kg  07/04/19 102.2 kg  04/25/19 101.6 kg  04/13/19 101.6 kg  03/21/19 100.7 kg  03/08/19 105.7 kg  03/06/19 105.7 kg  02/28/19 103.9 kg  02/26/19 103.9 kg  01/24/19 104.3 kg  12/26/18 100.7 kg  11/04/18 98 kg  10/25/18 103.2 kg  10/02/18 103 kg    Body mass index is 38.83 kg/m. Patient meets criteria for obesity based on current BMI. Weight has been stable since June. In May she weighed 232 lb which indicates 6 lb weight loss (2.6% body weight) in the past 5 months; not significant for time frame.   Spoke with RN who confirms that patient is leaving to go home shortly; Discharge Instructions currently being reviewed.   Checked in on patient and she denies any nutrition needs, simply wanting to "get out of here." She has not liked the hospital food so she has not been eating.   Current diet order is Regular and no intakes documented since admission. Labs and medications reviewed.   No nutrition interventions warranted at this time. If patient unable to d/c and nutrition issues arise, please re-consult RD.      Jarome Matin, MS, RD, LDN, [facility] Inpatient Clinical Dietitian Pager # [phone] After hours/weekend pager # [phone]

## 2019-08-13 NOTE — Progress Notes (Signed)
Patient was discharged from hospital via wheelchair accompanied  by nurse and husband. Written discharge paperwork was read and provided to husband and patient. All questions were answered to patient and family satisfaction. PIV was removed, with catheter intact. Follow up appointment contact information was provided.

## 2019-08-13 NOTE — Telephone Encounter (Signed)
Call received from patient's husband to inform Dr. [name] that patient is currently admitted at [facility] with dehydration and elevated creatinine.  Pt.'s husband is concerned that Shanon Brow is what is causing creatinine to elevate and would like a call back with Dr. Antonieta Pert thoughts.

## 2019-08-14 ENCOUNTER — Telehealth: Payer: Self-pay | Admitting: *Deleted

## 2019-08-14 DIAGNOSIS — E785 Hyperlipidemia, unspecified: Secondary | ICD-10-CM | POA: Diagnosis not present

## 2019-08-14 DIAGNOSIS — F329 Major depressive disorder, single episode, unspecified: Secondary | ICD-10-CM | POA: Diagnosis not present

## 2019-08-14 DIAGNOSIS — E11620 Type 2 diabetes mellitus with diabetic dermatitis: Secondary | ICD-10-CM | POA: Diagnosis not present

## 2019-08-14 DIAGNOSIS — D45 Polycythemia vera: Secondary | ICD-10-CM | POA: Diagnosis not present

## 2019-08-14 DIAGNOSIS — I77819 Aortic ectasia, unspecified site: Secondary | ICD-10-CM | POA: Diagnosis not present

## 2019-08-14 DIAGNOSIS — I130 Hypertensive heart and chronic kidney disease with heart failure and stage 1 through stage 4 chronic kidney disease, or unspecified chronic kidney disease: Secondary | ICD-10-CM | POA: Diagnosis not present

## 2019-08-14 DIAGNOSIS — F411 Generalized anxiety disorder: Secondary | ICD-10-CM | POA: Diagnosis not present

## 2019-08-14 DIAGNOSIS — I252 Old myocardial infarction: Secondary | ICD-10-CM | POA: Diagnosis not present

## 2019-08-14 DIAGNOSIS — N179 Acute kidney failure, unspecified: Secondary | ICD-10-CM | POA: Diagnosis not present

## 2019-08-14 DIAGNOSIS — G510 Bell's palsy: Secondary | ICD-10-CM | POA: Diagnosis not present

## 2019-08-14 DIAGNOSIS — D473 Essential (hemorrhagic) thrombocythemia: Secondary | ICD-10-CM | POA: Diagnosis not present

## 2019-08-14 DIAGNOSIS — D630 Anemia in neoplastic disease: Secondary | ICD-10-CM | POA: Diagnosis not present

## 2019-08-14 DIAGNOSIS — J309 Allergic rhinitis, unspecified: Secondary | ICD-10-CM | POA: Diagnosis not present

## 2019-08-14 DIAGNOSIS — E1142 Type 2 diabetes mellitus with diabetic polyneuropathy: Secondary | ICD-10-CM | POA: Diagnosis not present

## 2019-08-14 DIAGNOSIS — D509 Iron deficiency anemia, unspecified: Secondary | ICD-10-CM | POA: Diagnosis not present

## 2019-08-14 DIAGNOSIS — L409 Psoriasis, unspecified: Secondary | ICD-10-CM | POA: Diagnosis not present

## 2019-08-14 DIAGNOSIS — K580 Irritable bowel syndrome with diarrhea: Secondary | ICD-10-CM | POA: Diagnosis not present

## 2019-08-14 DIAGNOSIS — I2510 Atherosclerotic heart disease of native coronary artery without angina pectoris: Secondary | ICD-10-CM | POA: Diagnosis not present

## 2019-08-14 DIAGNOSIS — K7460 Unspecified cirrhosis of liver: Secondary | ICD-10-CM | POA: Diagnosis not present

## 2019-08-14 DIAGNOSIS — E1121 Type 2 diabetes mellitus with diabetic nephropathy: Secondary | ICD-10-CM | POA: Diagnosis not present

## 2019-08-14 DIAGNOSIS — N183 Chronic kidney disease, stage 3 unspecified: Secondary | ICD-10-CM | POA: Diagnosis not present

## 2019-08-14 DIAGNOSIS — I5032 Chronic diastolic (congestive) heart failure: Secondary | ICD-10-CM | POA: Diagnosis not present

## 2019-08-14 DIAGNOSIS — E861 Hypovolemia: Secondary | ICD-10-CM | POA: Diagnosis not present

## 2019-08-14 DIAGNOSIS — D631 Anemia in chronic kidney disease: Secondary | ICD-10-CM | POA: Diagnosis not present

## 2019-08-14 DIAGNOSIS — D62 Acute posthemorrhagic anemia: Secondary | ICD-10-CM | POA: Diagnosis not present

## 2019-08-14 NOTE — Telephone Encounter (Signed)
Transition Care Management Follow-up Telephone Call  Recommendations for Outpatient Follow-up:  1. Follow up with PCP in 1 week 2. Please obtain BMP/CBC in one week 3. Discontinued home Trulicity/Humulin R [id number] secondary to hypoglycemic episodes in setting of poor oral intake 4. Discontinued home losartan and spironolactone due to hypotension 5. Continue to monitor blood sugar closely and blood pressure 6.   Date discharged?08/13/2019   How have you been since you were released from the hospital? Fine and sleeping   Do you understand why you were in the hospital? yes   Do you understand the discharge instructions? yes   Where were you discharged to? home   Items Reviewed:  Medications reviewed: yes  Allergies reviewed: yes  Dietary changes reviewed: yes  Referrals reviewed: yes   Functional Questionnaire:   Activities of Daily Living (ADLs):   She states they are independent in the following: with husbands asistance  States they require assistance with the following:    Any transportation issues/concerns?: no   Any patient concerns? no   Confirmed importance and date/time of follow-up visits scheduled 08/17/2019 with Sallee Provencal  Provider Appointment booked with  Confirmed with patient if condition begins to worsen call PCP or go to the ER.  Patient was given the office number and encouraged to call back with question or concerns.  : yes

## 2019-08-15 ENCOUNTER — Other Ambulatory Visit: Payer: Self-pay

## 2019-08-15 DIAGNOSIS — I739 Peripheral vascular disease, unspecified: Secondary | ICD-10-CM

## 2019-08-16 LAB — CULTURE, BLOOD (ROUTINE X 2)
Culture: NO GROWTH
Special Requests: ADEQUATE

## 2019-08-17 ENCOUNTER — Encounter: Payer: Self-pay | Admitting: Adult Health

## 2019-08-17 ENCOUNTER — Inpatient Hospital Stay: Payer: PPO | Attending: Family | Admitting: Hematology & Oncology

## 2019-08-17 ENCOUNTER — Inpatient Hospital Stay: Payer: PPO

## 2019-08-17 ENCOUNTER — Other Ambulatory Visit: Payer: Self-pay

## 2019-08-17 ENCOUNTER — Encounter: Payer: Self-pay | Admitting: Hematology & Oncology

## 2019-08-17 ENCOUNTER — Ambulatory Visit (INDEPENDENT_AMBULATORY_CARE_PROVIDER_SITE_OTHER): Payer: PPO | Admitting: Adult Health

## 2019-08-17 VITALS — BP 142/68 | HR 108 | Temp 97.8°F | Resp 20

## 2019-08-17 VITALS — BP 130/66 | Temp 97.6°F | Wt 211.0 lb

## 2019-08-17 DIAGNOSIS — Z23 Encounter for immunization: Secondary | ICD-10-CM | POA: Diagnosis not present

## 2019-08-17 DIAGNOSIS — I9589 Other hypotension: Secondary | ICD-10-CM

## 2019-08-17 DIAGNOSIS — N179 Acute kidney failure, unspecified: Secondary | ICD-10-CM | POA: Diagnosis not present

## 2019-08-17 DIAGNOSIS — D45 Polycythemia vera: Secondary | ICD-10-CM | POA: Insufficient documentation

## 2019-08-17 DIAGNOSIS — I639 Cerebral infarction, unspecified: Secondary | ICD-10-CM | POA: Diagnosis not present

## 2019-08-17 DIAGNOSIS — F332 Major depressive disorder, recurrent severe without psychotic features: Secondary | ICD-10-CM

## 2019-08-17 DIAGNOSIS — R627 Adult failure to thrive: Secondary | ICD-10-CM

## 2019-08-17 DIAGNOSIS — E1142 Type 2 diabetes mellitus with diabetic polyneuropathy: Secondary | ICD-10-CM | POA: Diagnosis not present

## 2019-08-17 DIAGNOSIS — Z8673 Personal history of transient ischemic attack (TIA), and cerebral infarction without residual deficits: Secondary | ICD-10-CM | POA: Diagnosis not present

## 2019-08-17 DIAGNOSIS — R531 Weakness: Secondary | ICD-10-CM | POA: Insufficient documentation

## 2019-08-17 DIAGNOSIS — E861 Hypovolemia: Secondary | ICD-10-CM | POA: Diagnosis not present

## 2019-08-17 DIAGNOSIS — Z794 Long term (current) use of insulin: Secondary | ICD-10-CM | POA: Diagnosis not present

## 2019-08-17 DIAGNOSIS — N1832 Chronic kidney disease, stage 3b: Secondary | ICD-10-CM

## 2019-08-17 MED ORDER — LIDOCAINE-PRILOCAINE 2.5-2.5 % EX CREA: 1.0000 "application " | TOPICAL_CREAM | CUTANEOUS | 5 refills | Status: AC | PRN

## 2019-08-17 MED ORDER — MIRTAZAPINE 15 MG PO TABS: 15.0000 mg | ORAL_TABLET | Freq: Every day | ORAL | 0 refills | Status: DC

## 2019-08-17 NOTE — Progress Notes (Signed)
Subjective:    Patient ID: Amanda [name], female    DOB: Nov 03, 1940, 78 y.o.   MRN: [id number]  HPI   78 year old female who  has a past medical history of Allergic rhinitis (01/23/2016), C. difficile colitis, CAD (coronary artery disease), Cancer ([place]), Candidiasis of skin (09/30/2014), Cirrhosis ([place]), Depression, Diabetes mellitus (2008), Gout, Herpes simplex, Hyperglycemia (05/31/2013), Hyperlipidemia, Hypertension, Myocardial infarction [facility]), Neuromuscular disorder ([place]), Obesity, Polycythemia, and Psoriasis.  TCM visit - her husband is present during this visit   Admit Date:08/10/2019 Discharge Date: 08/13/2019  She presented to the emergency room on 08/10/2019 via EMS.  She reported few days of poor appetite, nausea, and lightheadedness.  She reported checking her blood pressure at home, finding it to be low, and called EMS who reported a systolic blood pressure of 80 in the field.  She denied abdominal pain, vomiting, or diarrhea, but stated that she just did not have an appetite and has not eaten much of anything over the past few days.  She denied any difficulty swallowing.  She also denied fevers, chills, shortness of breath, cough, chest pain, abdominal pain, dysuria, or flank pain.  On arrival to the emergency room, patient was found to be afebrile, saturating mid 90s on room air, and a blood pressure of 89/48.  Her chest x-ray was negative for acute cardiopulmonary disease.  Chemistry panel was notable for bicarbonate of 19, BUN 43, and creatinine 3.05, up from 1.68 earlier that month.  CBC featured a stable chronic leukocytosis of 21,300.  Patient was started on normal saline infusion in the emergency department.  Patient had been evaluated by PT/OT.  Their recommendation was for skilled nursing facility.  However the patient refused SNF.  Hospital Course  Hypotension - Blood pressures improved after IV fluid boluses.  His spironolactone and losartan were discontinued upon  discharge.  She was continued on furosemide for underlying cirrhosis.  AKA on CK D stage III -Creatinine is down to 1.78 on discharge.  Renal ultrasound with no acute findings, no hydronephrosis.  Leukocytosis Noted and chronic.  There was no other infectious signs or symptoms, no chest pain, no bleeding.  She had no growth on blood cultures or urine cultures.  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  Chronic diastolic CHF -EF was preserved on echo from June 2019 that demonstrated grade 1 diastolic dysfunction and moderate LAE.  Lower extremity edema likely due to cirrhosis.  Continue furosemide, discontinued spironolactone  Cirrhosis -Possibly behind intravascular volume depletion and lower extremity edema.  Advised to continue to monitor blood pressures closely and she may not be able to tolerate furosemide in the future.  Diabetes -Hemoglobin A1c 6.6. -Upon discharged discontinued Trulicity and insulin.  Failure to Thrive  -Poor p.o. intake and progressive weakness.  PT recommendation is for SNF placement although patient declines.  CAD -To new Plavix  History of CVA Continue Plavix  Generalized weakness:  Patient declined SNF.  She was discharged home with home health PT  Today her husband reports that he has discontinued all blood pressure medications and her blood pressure continues to be stable.  He has reintroduced Trulicity due to blood sugars is becoming elevated.  Since restarting Trulicity her blood sugars have been consistently in the 130s.  He has not reintroduced insulin just yet.  [name] reports that she is severely depressed and her appetite continues to be poor.  She is taking prescribed Remeron 7.5 mg.  She is often tearful at home and her husband  reports that he often is yelled at when he tries to encourage her to get up and do some walking or to eat.  Patient reports that she wishes to become more ambulatory but she is scared of falling despite having a  rolling walker with a chair.  She refuses to get up at home and spends most of her time in recliner.  Husband states "I do not know if there is anything else I can do for her when I try and get her up to walk she yells at me when I try and get her to eat something, she yells at me.  Her husband reports that she has not fallen in multiple months but despite this she is unwilling to even try and walk more than a few steps.   Review of Systems  Constitutional: Positive for activity change, appetite change and fatigue.  HENT: Negative.   Eyes: Negative.   Respiratory: Negative.   Cardiovascular: Positive for leg swelling.  Gastrointestinal: Positive for nausea. Negative for diarrhea.  Endocrine: Negative.   Genitourinary: Negative.   Musculoskeletal: Positive for gait problem.  Allergic/Immunologic: Negative.   Psychiatric/Behavioral: Positive for dysphoric mood.   Past Medical History:  Diagnosis Date  . Allergic rhinitis 01/23/2016  . C. difficile colitis   . CAD (coronary artery disease)    a. s/p STEMI in 01/2015 with 95% LCx stenosis and distal 80% LCx stenosis (DESx2 placed)  . Cancer ([place])    SKIN  . Candidiasis of skin 09/30/2014  . Cirrhosis ([place])   . Depression   . Diabetes mellitus 2008  . Gout   . Herpes simplex   . Hyperglycemia 05/31/2013  . Hyperlipidemia   . Hypertension   . Myocardial infarction ([place])   . 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 Obesity   . Polycythemia    Dr. Elease Hashimoto- [facility]  . Psoriasis     Social History   Socioeconomic History  . Marital status: Married    Spouse name: [name]  . Number of children: 2  . Years of education: 2 yr colle  . Highest education level: Not on file  Occupational History  . Occupation: housewife  Social Needs  . Financial resource strain: Not on file  . Food insecurity    Worry: Not on file    Inability: Not on file  . Transportation needs    Medical: Not on file    Non-medical: Not on file   Tobacco Use  . Smoking status: Former Smoker    Years: 48.00    Types: Cigarettes    Quit date: 10/18/1986    Years since quitting: 32.8  . Smokeless tobacco: Never Used  Substance and Sexual Activity  . Alcohol use: No    Alcohol/week: 0.0 standard drinks  . Drug use: No  . Sexual activity: Yes    Partners: Male    Birth control/protection: None  Lifestyle  . Physical activity    Days per week: Not on file    Minutes per session: Not on file  . Stress: Not on file  Relationships  . Social Herbalist on phone: Not on file    Gets together: Not on file    Attends religious service: Not on file    Active member of club or organization: Not on file    Attends meetings of clubs or organizations: Not on file    Relationship status: Not on file  . Intimate partner violence  Fear of current or ex partner: Not on file    Emotionally abused: Not on file    Physically abused: Not on file    Forced sexual activity: Not on file  Other Topics Concern  . Not on file  Social History Narrative   2 caffeine drink per day.  No regular exercise.     Retired from the bank   2 children (Daughter and a son) son has morbid obesity   2 grandchildren   3 great grandchildren    Past Surgical History:  Procedure Laterality Date  . ANKLE FRACTURE SURGERY    . BIOPSY  05/26/2018   Procedure: BIOPSY;  Surgeon: [name], MD;  Location: [facility] ENDOSCOPY;  Service: Endoscopy;;  . BREAST SURGERY Left    milk duct  . CATARACT EXTRACTION, BILATERAL    . COLONOSCOPY    . ESOPHAGOGASTRODUODENOSCOPY N/A 05/26/2018   Procedure: ESOPHAGOGASTRODUODENOSCOPY (EGD);  Surgeon: [name], MD;  Location[facility] ENDOSCOPY;  Service: Endoscopy;  Laterality: N/A;  . IR PARACENTESIS  05/25/2018  . LEFT HEART CATHETERIZATION WITH CORONARY ANGIOGRAM N/A 01/27/2015   Procedure: LEFT HEART CATHETERIZATION WITH CORONARY ANGIOGRAM;  Surgeon: [name], MD;  Location: [facility] CATH LAB;  Service: Cardiovascular;   Laterality: N/A;  . TONSILLECTOMY    . TOTAL ABDOMINAL HYSTERECTOMY W/ BILATERAL SALPINGOOPHORECTOMY     for heavy periods with appendectomy    Family History  Problem Relation Age of Onset  . Diabetes Mother   . Heart disease Mother        CAD  . Hyperlipidemia Mother   . Hypertension Mother   . Kidney disease Mother   . Kidney disease Father   . Breast cancer Maternal Aunt   . Heart disease Maternal Grandmother   . Heart disease Other        maternal aunts and uncles  . Colon cancer Neg Hx   . Esophageal cancer Neg Hx     Allergies  Allergen Reactions  . Penicillins Swelling    Has patient had a PCN reaction causing immediate rash, facial/tongue/throat swelling, SOB or lightheadedness with hypotension: No Has patient had a PCN reaction causing severe rash involving mucus membranes or skin necrosis: No Has patient had a PCN reaction that required hospitalization: No Has patient had a PCN reaction occurring within the last 10 years: No If all of the above answers are "NO", then may proceed with Cephalosporin use.  Marland Kitchen Lisinopril Cough  . Tape Hives  . Doxycycline Hives, Swelling and Rash  . Latex Hives, Itching and Rash    Current Outpatient Medications on File Prior to Visit  Medication Sig Dispense Refill  . acetaminophen (TYLENOL) 325 MG tablet Take 2 tablets (650 mg total) by mouth every 6 (six) hours as needed for mild pain (or Fever >/= 101).    . Blood Glucose Monitoring Suppl (ACCU-CHEK GUIDE) w/Device KIT 1 kit by Does not apply route as directed. 1 kit 0  . clopidogrel (PLAVIX) 75 MG tablet TAKE 1 TABLET(75 MG) BY MOUTH DAILY (Patient taking differently: Take 75 mg by mouth daily. ) 90 tablet 3  . ezetimibe (ZETIA) 10 MG tablet TAKE 1 TABLET(10 MG) BY MOUTH DAILY (Patient taking differently: Take 10 mg by mouth daily. ) 90 tablet 0  . furosemide (LASIX) 40 MG tablet Take 40 mg by mouth daily. Take along with 20 mg tablet    . glucose blood (ACCU-CHEK GUIDE) test  strip Use to check blood sugars 3 times daily 100 each  12  . JAKAFI 25 MG tablet TAKE 1 TABLET (25 MG TOTAL) BY MOUTH 2 (TWO) TIMES DAILY. (Patient taking differently: Take 25 mg by mouth 2 (two) times daily. ) 60 tablet 6  . latanoprost (XALATAN) 0.005 % ophthalmic solution Place 1 drop into both eyes at bedtime.     . mirtazapine (REMERON) 7.5 MG tablet TAKE 1 TABLET(7.5 MG) BY MOUTH AT BEDTIME (Patient taking differently: Take 7.5 mg by mouth at bedtime. ) 90 tablet 1  . ONETOUCH VERIO test strip USE TO TEST BLOOD SUGAR THREE TIMES DAILY 300 each 2  . oxyCODONE-acetaminophen (PERCOCET/ROXICET) 5-325 MG tablet Take 1-2 tablets by mouth every 8 (eight) hours as needed for severe pain. 15 tablet 0  . potassium chloride (KLOR-CON 10) 10 MEQ tablet Take 1 tablet (10 mEq total) by mouth daily. 30 tablet 6  . promethazine (PHENERGAN) 25 MG tablet Take 0.5 tablets (12.5 mg total) by mouth every 6 (six) hours as needed for nausea or vomiting. 30 tablet 1   No current facility-administered medications on file prior to visit.     BP 130/66   Temp 97.6 F (36.4 C)   Wt 211 lb (95.7 kg) Comment: PT REPORTED  BMI 36.22 kg/m       Objective:   Physical Exam Vitals signs and nursing note reviewed.  Constitutional:      Appearance: She is obese.     Comments: disheveled appearance   HENT:     Head: Normocephalic and atraumatic.     Mouth/Throat:     Mouth: Mucous membranes are moist.     Dentition: Abnormal dentition.     Pharynx: Oropharynx is clear.  Cardiovascular:     Rate and Rhythm: Normal rate and regular rhythm.     Pulses: Normal pulses.     Heart sounds: Normal heart sounds.  Pulmonary:     Effort: Pulmonary effort is normal.     Breath sounds: Normal breath sounds.  Skin:    General: Skin is warm and dry.     Capillary Refill: Capillary refill takes less than 2 seconds.  Neurological:     General: No focal deficit present.     Mental Status: She is alert and oriented to  person, place, and time.  Psychiatric:        Mood and Affect: Mood is depressed. Affect is angry and tearful.        Speech: Speech normal.        Behavior: Behavior is agitated.        Thought Content: Thought content normal.        Cognition and Memory: Cognition and memory normal.       Assessment & Plan:  1. Failure to thrive in adult -Encouraged her to get up and walk using her rolling walker.  Although she endorses that she would like to become more ambulatory she does not have a driver willingness to do what is needed.  She is going through home health PT at this time which I think is good for her but this is only a short course of physical therapy.  She needs to be able to do this on her own.  There is not much I can do for her if she is not willing to take responsibility. - Ambulatory referral to Psychiatry - mirtazapine (REMERON) 15 MG tablet; Take 1 tablet (15 mg total) by mouth at bedtime.  Dispense: 90 tablet; Refill: 0  2. Generalized weakness -See above  3. Cerebrovascular  accident (CVA), unspecified mechanism ([place]) -Continue with Plavix  4. Need for prophylactic vaccination and inoculation against influenza  - Flu Vaccine QUAD High Dose(Fluad)  5. Hypotension due to hypovolemia - Continue to monitor BP at home  - Return precautions reviewed   6. Acute renal failure superimposed on stage 3b chronic kidney disease, unspecified acute renal failure type ([place]) -Encouraged hydration.  She will have labs done at Dr. Antonieta Pert office this afternoon we will forego our labs so she does not have to be stuck more than once today  7. History of CVA (cerebrovascular accident) -Blood pressure stable without blood pressure medications.  Advised husband to continue to monitor blood pressure at home and report back if blood pressures consistently above mid 404 systolic  8. Type 2 diabetes mellitus with diabetic polyneuropathy, with long-term current use of insulin ([facility]) -Okay to  restart Trulicity.  Monitor blood sugars carefully.  Forego insulin at this time  9. Severe episode of recurrent major depressive disorder, without psychotic features ([place]) -We will increase Remeron to 15 mg and refer to psychiatry.  I think if she could become more ambulatory then her mood would better.  Hopefully Remeron will help with appetite stimulation as well.  Eyes husband to follow-up if no improvement over the next 2 to 3 weeks - Ambulatory referral to Psychiatry - mirtazapine (REMERON) 15 MG tablet; Take 1 tablet (15 mg total) by mouth at bedtime.  Dispense: 90 tablet; Refill: 0   [name], NP

## 2019-08-17 NOTE — Progress Notes (Signed)
Hematology and Oncology Follow Up Visit  KOURTNEE LAHEY [id number] 1941/01/20 78 y.o. 08/17/2019   Principle Diagnosis:  [name] - JAK2 (+)  Current Therapy:   Jakafi 25 mg po q day   Interim History:  Ms. Amanda [name] is here today for follow-up.  She recently got discharged from the hospital.  His sounds like she was dehydrated.  Not sure exactly what happened when she was in the hospital or why she went to the hospital.  She really cannot give much information.   From the discharge summary, looks like she had problems with blood pressure.  She also had a renal failure.  This was improved.  I think her liver function studies were little bit elevated.  It did not look like she had any infection.  Her blood sugar is have been doing pretty well.  The doctors did a great job and cut back on a lot of her medications which I thought was incredibly important.  She is on Jakafi daily and not twice a day.  Unfortunate, we cannot get any blood on her today.  She absolutely  has no IV access.  I talked to her about a Port-A-Cath.  She will agree to a Port-A-Cath.  I think this will be incredibly helpful.  She actually looks quite good.  She is weak in the legs.  She is getting some physical therapy at home.  She is eating poorly from what her husband says.  She just does not have much of an appetite.  Offered to give her some appetite stimulants but she does not want any.  She not having any diarrhea.  I know that diarrhea has been a problem for her in the past.  There is been no fever.  She has had no bleeding.  She has had no rashes.  Thankfully, she has not fallen.  Overall, I would say her performance status is ECOG 3.    Medications:  Allergies as of 08/17/2019      Reactions   Penicillins Swelling   Has patient had a PCN reaction causing immediate rash, facial/tongue/throat swelling, SOB or lightheadedness with hypotension: No Has patient had a PCN reaction causing severe  rash involving mucus membranes or skin necrosis: No Has patient had a PCN reaction that required hospitalization: No Has patient had a PCN reaction occurring within the last 10 years: No If all of the above answers are "NO", then may proceed with Cephalosporin use.   Lisinopril Cough   Tape Hives   Doxycycline Hives, Swelling, Rash   Latex Hives, Itching, Rash      Medication List       Accurate as of August 17, 2019  5:05 PM. If you have any questions, ask your nurse or doctor.        Accu-Chek Guide w/Device Kit 1 kit by Does not apply route as directed.   acetaminophen 325 MG tablet Commonly known as: TYLENOL Take 2 tablets (650 mg total) by mouth every 6 (six) hours as needed for mild pain (or Fever >/= 101).   clopidogrel 75 MG tablet Commonly known as: PLAVIX TAKE 1 TABLET(75 MG) BY MOUTH DAILY What changed: See the new instructions.   ezetimibe 10 MG tablet Commonly known as: ZETIA TAKE 1 TABLET(10 MG) BY MOUTH DAILY What changed: See the new instructions.   furosemide 40 MG tablet Commonly known as: LASIX Take 40 mg by mouth daily. Take along with 20 mg tablet   glucose blood test strip Commonly  known as: Accu-Chek Guide Use to check blood sugars 3 times daily   OneTouch Verio test strip Generic drug: glucose blood USE TO TEST BLOOD SUGAR THREE TIMES DAILY   Jakafi 25 MG tablet Generic drug: ruxolitinib phosphate TAKE 1 TABLET (25 MG TOTAL) BY MOUTH 2 (TWO) TIMES DAILY. What changed: See the new instructions.   latanoprost 0.005 % ophthalmic solution Commonly known as: XALATAN Place 1 drop into both eyes at bedtime.   mirtazapine 15 MG tablet Commonly known as: REMERON Take 1 tablet (15 mg total) by mouth at bedtime. What changed:   medication strength  See the new instructions. Changed by: [name], NP   oxyCODONE-acetaminophen 5-325 MG tablet Commonly known as: PERCOCET/ROXICET Take 1-2 tablets by mouth every 8 (eight) hours as needed  for severe pain.   potassium chloride 10 MEQ tablet Commonly known as: Klor-Con 10 Take 1 tablet (10 mEq total) by mouth daily.   promethazine 25 MG tablet Commonly known as: PHENERGAN Take 0.5 tablets (12.5 mg total) by mouth every 6 (six) hours as needed for nausea or vomiting.       Allergies:  Allergies  Allergen Reactions  . Penicillins Swelling    Has patient had a PCN reaction causing immediate rash, facial/tongue/throat swelling, SOB or lightheadedness with hypotension: No Has patient had a PCN reaction causing severe rash involving mucus membranes or skin necrosis: No Has patient had a PCN reaction that required hospitalization: No Has patient had a PCN reaction occurring within the last 10 years: No If all of the above answers are "NO", then may proceed with Cephalosporin use.  Marland Kitchen Lisinopril Cough  . Tape Hives  . Doxycycline Hives, Swelling and Rash  . Latex Hives, Itching and Rash    Past Medical History, Surgical history, Social history, and Family History were reviewed and updated.  Review of Systems: Review of Systems  Constitutional: Negative.   HENT: Negative.   Eyes: Negative.   Respiratory: Negative.   Cardiovascular: Negative.   Gastrointestinal: Negative.   Genitourinary: Negative.   Musculoskeletal: Negative.   Skin: Negative.   Neurological: Negative.   Endo/Heme/Allergies: Negative.   Psychiatric/Behavioral: Negative.    Marland Kitchen   Physical Exam:  temporal temperature is 97.8 F (36.6 C). Her blood pressure is 142/68 (abnormal) and her pulse is 108 (abnormal). Her respiration is 20 and oxygen saturation is 98%.   Wt Readings from Last 3 Encounters:  08/17/19 211 lb (95.7 kg)  08/13/19 226 lb 3.1 oz (102.6 kg)  07/09/19 224 lb (101.6 kg)    Physical Exam Vitals signs reviewed.  HENT:     Head: Normocephalic and atraumatic.  Eyes:     Pupils: Pupils are equal, round, and reactive to light.  Neck:     Musculoskeletal: Normal range of  motion.  Cardiovascular:     Rate and Rhythm: Normal rate and regular rhythm.     Heart sounds: Normal heart sounds.  Pulmonary:     Effort: Pulmonary effort is normal.     Breath sounds: Normal breath sounds.  Abdominal:     General: Bowel sounds are normal.     Palpations: Abdomen is soft.  Musculoskeletal: Normal range of motion.        General: No tenderness or deformity.  Lymphadenopathy:     Cervical: No cervical adenopathy.  Skin:    General: Skin is warm and dry.     Findings: No erythema or rash.  Neurological:     Mental Status: She is alert  and oriented to person, place, and time.  Psychiatric:        Behavior: Behavior normal.        Thought Content: Thought content normal.        Judgment: Judgment normal.      Lab Results  Component Value Date   WBC 15.3 (H) 08/12/2019   HGB 12.0 08/12/2019   HCT 38.1 08/12/2019   MCV 95.7 08/12/2019   PLT 194 08/12/2019   Lab Results  Component Value Date   FERRITIN 219 07/09/2019   IRON 104 07/09/2019   TIBC 316 07/09/2019   UIBC 213 07/09/2019   IRONPCTSAT 33 07/09/2019   Lab Results  Component Value Date   RETICCTPCT 1.8 04/25/2019   RBC 3.98 08/12/2019   No results found for: Nils Pyle [facility] Lab Results  Component Value Date   IGA 233 03/29/2014   No results found for: Kathrynn Ducking, MSPIKE, SPEI   Chemistry      Component Value Date/Time   NA 137 08/13/2019 0641   NA 140 10/12/2017 1013   NA 138 01/21/2017 0806   K 4.8 08/13/2019 0641   K 3.7 10/12/2017 1013   K 4.1 01/21/2017 0806   CL 112 (H) 08/13/2019 0641   CL 103 10/12/2017 1013   CL 103 02/16/2013 0900   CO2 16 (L) 08/13/2019 0641   CO2 25 10/12/2017 1013   CO2 21 (L) 01/21/2017 0806   BUN 27 (H) 08/13/2019 0641   BUN 10 10/12/2017 1013   BUN 12.2 01/21/2017 0806   CREATININE 1.78 (H) 08/13/2019 0641   CREATININE 1.78 (H) 07/09/2019 0932   CREATININE 0.9 10/12/2017  1013   CREATININE 1.0 01/21/2017 0806      Component Value Date/Time   CALCIUM 8.6 (L) 08/13/2019 0641   CALCIUM 8.7 10/12/2017 1013   CALCIUM 9.3 01/21/2017 0806   ALKPHOS 63 08/10/2019 1906   ALKPHOS 97 (H) 10/12/2017 1013   ALKPHOS 112 01/21/2017 0806   AST 20 08/10/2019 1906   AST 26 07/09/2019 0932   AST 37 (H) 01/21/2017 0806   ALT 11 08/10/2019 1906   ALT 11 07/09/2019 0932   ALT 25 10/12/2017 1013   ALT 16 01/21/2017 0806   BILITOT 1.1 08/10/2019 1906   BILITOT 1.1 07/09/2019 0932   BILITOT 0.89 01/21/2017 0806       Impression and Plan: Ms. Vinsant is a very pleasant 78 yo caucasian female with polycythemia vera, JAK-2 positive.   It really would be nice to get blood on her but again she has no IV access.  I will put the order in for a Port-A-Cath to be placed.  We will try to get this in a couple weeks.  I am just glad that she the hospital after only couple days.  I know that in the past, she has had lengthy hospitalizations.  I will plan to get her back to see [place] right after Thanksgiving.  She is incredibly diligent with social distancing and not having anybody come over to her house, even her family.  I realize that she is at high risk for the coronavirus.  I spent about 35 minutes with she and her husband.  I had to try to figure out what was going on with her in the hospital.  I had to review the hospital notes and labs and x-rays.  I had ordered the Port-A-Cath to be placed by interventional radiology.      Rudell Cobb  [name], MD 10/30/20205:05 PM

## 2019-08-17 NOTE — Patient Instructions (Signed)
You can take Zetia and Plavix at night   You need to get up and start walking   I am going to increase Remeron  To 15 mg. You can take two tabs of what you have at home   Someone from psychiatry will call you to schedule your exam   Please start walking at home

## 2019-08-20 ENCOUNTER — Telehealth (HOSPITAL_COMMUNITY): Payer: Self-pay | Admitting: Psychiatry

## 2019-08-20 ENCOUNTER — Telehealth (HOSPITAL_COMMUNITY): Payer: Self-pay

## 2019-08-20 NOTE — Telephone Encounter (Signed)
D:  Tamara (referral coordinator) referred pt to [place].  A:  Placed call to orient and provide pt with a start date for MH-IOP; but pt states she is not interested in group.  She and husband states she is wanting a psychiatrist appointment not group.  Inform Jonelle Sidle.

## 2019-08-20 NOTE — Telephone Encounter (Signed)
The above patient or their representative was contacted and gave the following answers to these questions:         Do you have any of the following symptoms?    NO  Fever                    Cough                   Shortness of breath  Do  you have any of the following other symptoms? NO   muscle pain         vomiting,        diarrhea        rash         weakness        red eye        abdominal pain         bruising          bruising or bleeding              joint pain           severe headache    Have you been in contact with someone who was or has been sick in the past 2 weeks?  NO  Yes                 Unsure                         Unable to assess   Does the person that you were in contact with have any of the following symptoms?   Cough         shortness of breath           muscle pain         vomiting,            diarrhea            rash            weakness           fever            red eye           abdominal pain           bruising  or  bleeding                joint pain                severe headache                 COMMENTS OR ACTION PLAN FOR THIS PATIENT:

## 2019-08-20 NOTE — Telephone Encounter (Signed)
Complete

## 2019-08-21 ENCOUNTER — Ambulatory Visit (INDEPENDENT_AMBULATORY_CARE_PROVIDER_SITE_OTHER): Payer: PPO | Admitting: Vascular Surgery

## 2019-08-21 ENCOUNTER — Ambulatory Visit (HOSPITAL_COMMUNITY)
Admission: RE | Admit: 2019-08-21 | Discharge: 2019-08-21 | Disposition: A | Discharge status: Home / Self Care | Payer: PPO | Source: Ambulatory Visit | Attending: Family | Admitting: Family

## 2019-08-21 ENCOUNTER — Encounter: Payer: Self-pay | Admitting: Vascular Surgery

## 2019-08-21 ENCOUNTER — Other Ambulatory Visit: Payer: Self-pay

## 2019-08-21 DIAGNOSIS — I739 Peripheral vascular disease, unspecified: Secondary | ICD-10-CM

## 2019-08-21 DIAGNOSIS — M7989 Other specified soft tissue disorders: Secondary | ICD-10-CM | POA: Insufficient documentation

## 2019-08-21 NOTE — Progress Notes (Signed)
Patient name: Amanda [name] MRN: [id number] DOB: [birth year] Sex: female  REASON FOR CONSULT: Evaluate for arterial and venous disease of the lower extremities  HPI: Amanda [name] is a 78 y.o. female, with multiple medical problems including hypertension, hyperlipidemia, coronary artery disease status post MI, diabetes, cirrhosis that presents as a referral from podiatry for evaluation of lower extremity arterial and venous disease.  Patient reports she was recently getting evaluated by her podiatrist and there was concern for decreased pulses in her lower extremities.  She denies any pain in the feet other than tingling and states she has had a long history of neuropathy.  No non-healing wounds.  Her main complaint today is leg swelling.  She was recently hospitalized for dehydration.  Husband states he tries to get her to wear compression socks but he can never find any socks that fit.  No previous lower extremity interventions.  Denies tobacco abuse.  Past Medical History:  Diagnosis Date  . Allergic rhinitis 01/23/2016  . C. difficile colitis   . CAD (coronary artery disease)    a. s/p STEMI in 01/2015 with 95% LCx stenosis and distal 80% LCx stenosis (DESx2 placed)  . Cancer ([place])    SKIN  . Candidiasis of skin 09/30/2014  . Cirrhosis ([place])   . Depression   . Diabetes mellitus 2008  . Gout   . Herpes simplex   . Hyperglycemia 05/31/2013  . Hyperlipidemia   . Hypertension   . Myocardial infarction ([place])   . 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 Obesity   . Polycythemia    Dr. Elease Hashimoto- [facility]  . Psoriasis     Past Surgical History:  Procedure Laterality Date  . ANKLE FRACTURE SURGERY    . BIOPSY  05/26/2018   Procedure: BIOPSY;  Surgeon: [name], MD;  Location: [facility] ENDOSCOPY;  Service: Endoscopy;;  . BREAST SURGERY Left    milk duct  . CATARACT EXTRACTION, BILATERAL    . COLONOSCOPY    . ESOPHAGOGASTRODUODENOSCOPY N/A 05/26/2018   Procedure:  ESOPHAGOGASTRODUODENOSCOPY (EGD);  Surgeon: [name], MD;  Location: [facility] ENDOSCOPY;  Service: Endoscopy;  Laterality: N/A;  . IR PARACENTESIS  05/25/2018  . LEFT HEART CATHETERIZATION WITH CORONARY ANGIOGRAM N/A 01/27/2015   Procedure: LEFT HEART CATHETERIZATION WITH CORONARY ANGIOGRAM;  Surgeon: [name], MD;  Location: [facility] CATH LAB;  Service: Cardiovascular;  Laterality: N/A;  . TONSILLECTOMY    . TOTAL ABDOMINAL HYSTERECTOMY W/ BILATERAL SALPINGOOPHORECTOMY     for heavy periods with appendectomy    Family History  Problem Relation Age of Onset  . Diabetes Mother   . Heart disease Mother        CAD  . Hyperlipidemia Mother   . Hypertension Mother   . Kidney disease Mother   . Kidney disease Father   . Breast cancer Maternal Aunt   . Heart disease Maternal Grandmother   . Heart disease Other        maternal aunts and uncles  . Colon cancer Neg Hx   . Esophageal cancer Neg Hx     SOCIAL HISTORY: Social History   Socioeconomic History  . Marital status: Married    Spouse name: [name]  . Number of children: 2  . Years of education: 2 yr colle  . Highest education level: Not on file  Occupational History  . Occupation: housewife  Social Needs  . Financial resource strain: Not on file  . Food insecurity  Worry: Not on file    Inability: Not on file  . Transportation needs    Medical: Not on file    Non-medical: Not on file  Tobacco Use  . Smoking status: Former Smoker    Years: 48.00    Types: Cigarettes    Quit date: 10/18/1986    Years since quitting: 32.8  . Smokeless tobacco: Never Used  Substance and Sexual Activity  . Alcohol use: No    Alcohol/week: 0.0 standard drinks  . Drug use: No  . Sexual activity: Yes    Partners: Male    Birth control/protection: None  Lifestyle  . Physical activity    Days per week: Not on file    Minutes per session: Not on file  . Stress: Not on file  Relationships  . Social Herbalist on phone: Not on file     Gets together: Not on file    Attends religious service: Not on file    Active member of club or organization: Not on file    Attends meetings of clubs or organizations: Not on file    Relationship status: Not on file  . Intimate partner violence    Fear of current or ex partner: Not on file    Emotionally abused: Not on file    Physically abused: Not on file    Forced sexual activity: Not on file  Other Topics Concern  . Not on file  Social History Narrative   2 caffeine drink per day.  No regular exercise.     Retired from the bank   2 children (Daughter and a son) son has morbid obesity   2 grandchildren   3 great grandchildren    Allergies  Allergen Reactions  . Penicillins Swelling    Has patient had a PCN reaction causing immediate rash, facial/tongue/throat swelling, SOB or lightheadedness with hypotension: No Has patient had a PCN reaction causing severe rash involving mucus membranes or skin necrosis: No Has patient had a PCN reaction that required hospitalization: No Has patient had a PCN reaction occurring within the last 10 years: No If all of the above answers are "NO", then may proceed with Cephalosporin use.  Marland Kitchen Lisinopril Cough  . Tape Hives  . Doxycycline Hives, Swelling and Rash  . Latex Hives, Itching and Rash    Current Outpatient Medications  Medication Sig Dispense Refill  . acetaminophen (TYLENOL) 325 MG tablet Take 2 tablets (650 mg total) by mouth every 6 (six) hours as needed for mild pain (or Fever >/= 101).    . Blood Glucose Monitoring Suppl (ACCU-CHEK GUIDE) w/Device KIT 1 kit by Does not apply route as directed. 1 kit 0  . clopidogrel (PLAVIX) 75 MG tablet TAKE 1 TABLET(75 MG) BY MOUTH DAILY (Patient taking differently: Take 75 mg by mouth daily. ) 90 tablet 3  . ezetimibe (ZETIA) 10 MG tablet TAKE 1 TABLET(10 MG) BY MOUTH DAILY (Patient taking differently: Take 10 mg by mouth daily. ) 90 tablet 0  . furosemide (LASIX) 40 MG tablet Take 40 mg  by mouth daily. Take along with 20 mg tablet    . glucose blood (ACCU-CHEK GUIDE) test strip Use to check blood sugars 3 times daily 100 each 12  . JAKAFI 25 MG tablet TAKE 1 TABLET (25 MG TOTAL) BY MOUTH 2 (TWO) TIMES DAILY. (Patient taking differently: Take 25 mg by mouth 2 (two) times daily. ) 60 tablet 6  . latanoprost (XALATAN) 0.005 % ophthalmic  solution Place 1 drop into both eyes at bedtime.     . lidocaine-prilocaine (EMLA) cream Apply 1 application topically as needed. Please apply EMLA over the Port-A-Cath site 1 hour prior to coming to our office. 30 g 5  . mirtazapine (REMERON) 15 MG tablet Take 1 tablet (15 mg total) by mouth at bedtime. 90 tablet 0  . ONETOUCH VERIO test strip USE TO TEST BLOOD SUGAR THREE TIMES DAILY 300 each 2  . oxyCODONE-acetaminophen (PERCOCET/ROXICET) 5-325 MG tablet Take 1-2 tablets by mouth every 8 (eight) hours as needed for severe pain. 15 tablet 0  . potassium chloride (KLOR-CON 10) 10 MEQ tablet Take 1 tablet (10 mEq total) by mouth daily. 30 tablet 6  . promethazine (PHENERGAN) 25 MG tablet Take 0.5 tablets (12.5 mg total) by mouth every 6 (six) hours as needed for nausea or vomiting. 30 tablet 1   No current facility-administered medications for this visit.     REVIEW OF SYSTEMS:  [X]  denotes positive finding, [ ]  denotes negative finding Cardiac  Comments:  Chest pain or chest pressure:    Shortness of breath upon exertion:    Short of breath when lying flat:    Irregular heart rhythm:        Vascular    Pain in calf, thigh, or hip brought on by ambulation:    Pain in feet at night that wakes you up from your sleep:     Blood clot in your veins:    Leg swelling:  x bilateral      Pulmonary    Oxygen at home:    Productive cough:     Wheezing:         Neurologic    Sudden weakness in arms or legs:     Sudden numbness in arms or legs:     Sudden onset of difficulty speaking or slurred speech:    Temporary loss of vision in one eye:      Problems with dizziness:         Gastrointestinal    Blood in stool:     Vomited blood:         Genitourinary    Burning when urinating:     Blood in urine:        Psychiatric    Major depression:         Hematologic    Bleeding problems:    Problems with blood clotting too easily:        Skin    Rashes or ulcers:        Constitutional    Fever or chills:      PHYSICAL EXAM: Vitals:   08/21/19 0952  BP: (!) 177/95  Pulse: (!) 54  Resp: 14  Temp: 97.8 F (36.6 C)  TempSrc: Temporal  SpO2: 95%  Weight: 211 lb (95.7 kg)  Height: 5' 4"  (1.626 m)    GENERAL: The patient is a well-nourished female, in no acute distress. The vital signs are documented above. CARDIAC: There is a regular rate and rhythm.  VASCULAR:  Difficult to appreciate femoral pulses given body habitus and positioning on the table. Palpable dorsalis pedis pulses bilaterally No lower extremity tissue loss, no dependent rubor. PULMONARY: There is good air exchange bilaterally without wheezing or rales. ABDOMEN: Soft and non-tender with normal pitched bowel sounds.  MUSCULOSKELETAL: There are no major deformities or cyanosis. NEUROLOGIC: No focal weakness or paresthesias are detected. SKIN: There are no ulcers or rashes noted. PSYCHIATRIC: The  patient has a normal affect.  DATA:   I independently reviewed her ABIs and agree that they are noncompressible likely due to calcification from her diabetes.  However she does have triphasic waveforms at the ankles which are normal.  Assessment/Plan:  78 year old female presents for evaluation of bilateral lower extremity arterial and venous disease from [facility].  On exam she has palpable dorsalis pedis pulses bilaterally.  In review of her ABI studies today she has normal triphasic waveforms at both ankles although here ABIs are largely noncompressible.  Discussed with her and her husband that I do not think she needs any intervention from a  arterial standpoint.  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  On exam I do not see any evidence of tissue loss dependent rubor or any other overt finding that would necessitate a arterial intervention and she also has palpable pulse on exam. Her main complaint is leg swelling.  She does have a number of systemic illnesses including her cirrhosis and cardiac history that may explain her leg swelling.  I discussed with her and her husband importance of leg elevation and wearing compression.  Discussed thigh-high compression if possible.  She states she cannot get anything that fits so we measured her for thigh-high compression today.  I will get lower extremity reflux study just to ensure she does not have any underlying venous insufficiency DVT or other etiology for swelling that would potentially warrant intervention.  I discussed I will give the patient a call with the results.   Marty Heck, MD [facility] Office: [phone] Pager: [phone]

## 2019-08-22 ENCOUNTER — Telehealth: Payer: Self-pay | Admitting: Gastroenterology

## 2019-08-22 DIAGNOSIS — F329 Major depressive disorder, single episode, unspecified: Secondary | ICD-10-CM | POA: Diagnosis not present

## 2019-08-22 DIAGNOSIS — N183 Chronic kidney disease, stage 3 unspecified: Secondary | ICD-10-CM | POA: Diagnosis not present

## 2019-08-22 DIAGNOSIS — I252 Old myocardial infarction: Secondary | ICD-10-CM | POA: Diagnosis not present

## 2019-08-22 DIAGNOSIS — K7460 Unspecified cirrhosis of liver: Secondary | ICD-10-CM | POA: Diagnosis not present

## 2019-08-22 DIAGNOSIS — D509 Iron deficiency anemia, unspecified: Secondary | ICD-10-CM | POA: Diagnosis not present

## 2019-08-22 DIAGNOSIS — E1121 Type 2 diabetes mellitus with diabetic nephropathy: Secondary | ICD-10-CM | POA: Diagnosis not present

## 2019-08-22 DIAGNOSIS — I2510 Atherosclerotic heart disease of native coronary artery without angina pectoris: Secondary | ICD-10-CM | POA: Diagnosis not present

## 2019-08-22 DIAGNOSIS — E861 Hypovolemia: Secondary | ICD-10-CM | POA: Diagnosis not present

## 2019-08-22 DIAGNOSIS — D473 Essential (hemorrhagic) thrombocythemia: Secondary | ICD-10-CM | POA: Diagnosis not present

## 2019-08-22 DIAGNOSIS — L409 Psoriasis, unspecified: Secondary | ICD-10-CM | POA: Diagnosis not present

## 2019-08-22 DIAGNOSIS — N179 Acute kidney failure, unspecified: Secondary | ICD-10-CM | POA: Diagnosis not present

## 2019-08-22 DIAGNOSIS — K580 Irritable bowel syndrome with diarrhea: Secondary | ICD-10-CM | POA: Diagnosis not present

## 2019-08-22 DIAGNOSIS — J309 Allergic rhinitis, unspecified: Secondary | ICD-10-CM | POA: Diagnosis not present

## 2019-08-22 DIAGNOSIS — E1142 Type 2 diabetes mellitus with diabetic polyneuropathy: Secondary | ICD-10-CM | POA: Diagnosis not present

## 2019-08-22 DIAGNOSIS — D630 Anemia in neoplastic disease: Secondary | ICD-10-CM | POA: Diagnosis not present

## 2019-08-22 DIAGNOSIS — E785 Hyperlipidemia, unspecified: Secondary | ICD-10-CM | POA: Diagnosis not present

## 2019-08-22 DIAGNOSIS — E11620 Type 2 diabetes mellitus with diabetic dermatitis: Secondary | ICD-10-CM | POA: Diagnosis not present

## 2019-08-22 DIAGNOSIS — D631 Anemia in chronic kidney disease: Secondary | ICD-10-CM | POA: Diagnosis not present

## 2019-08-22 DIAGNOSIS — D62 Acute posthemorrhagic anemia: Secondary | ICD-10-CM | POA: Diagnosis not present

## 2019-08-22 DIAGNOSIS — I130 Hypertensive heart and chronic kidney disease with heart failure and stage 1 through stage 4 chronic kidney disease, or unspecified chronic kidney disease: Secondary | ICD-10-CM | POA: Diagnosis not present

## 2019-08-22 DIAGNOSIS — G510 Bell's palsy: Secondary | ICD-10-CM | POA: Diagnosis not present

## 2019-08-22 DIAGNOSIS — F411 Generalized anxiety disorder: Secondary | ICD-10-CM | POA: Diagnosis not present

## 2019-08-22 DIAGNOSIS — I77819 Aortic ectasia, unspecified site: Secondary | ICD-10-CM | POA: Diagnosis not present

## 2019-08-22 DIAGNOSIS — D45 Polycythemia vera: Secondary | ICD-10-CM | POA: Diagnosis not present

## 2019-08-22 DIAGNOSIS — I5032 Chronic diastolic (congestive) heart failure: Secondary | ICD-10-CM | POA: Diagnosis not present

## 2019-08-22 NOTE — Telephone Encounter (Signed)
The pt has been advised that I do not see any orders from Dr [name] office.  I do see Dr [name] has some labs ordered.  She will call that office and see if they will still require blood.

## 2019-08-22 NOTE — Telephone Encounter (Signed)
Patient is supposed to have blood drawn this Friday but she was in the hospital last week and was poked a lot and she went to get blood work the other day and they werent able to get blood- she is asking if she has to get blood drawn this Friday.

## 2019-08-23 ENCOUNTER — Other Ambulatory Visit: Payer: Self-pay

## 2019-08-23 DIAGNOSIS — I739 Peripheral vascular disease, unspecified: Secondary | ICD-10-CM

## 2019-08-23 DIAGNOSIS — M7989 Other specified soft tissue disorders: Secondary | ICD-10-CM

## 2019-08-23 NOTE — Addendum Note (Signed)
Addended by: York Cerise C on: 08/23/2019 01:47 PM   Modules accepted: Orders

## 2019-08-24 ENCOUNTER — Other Ambulatory Visit: Payer: Self-pay | Admitting: *Deleted

## 2019-08-24 ENCOUNTER — Telehealth: Payer: Self-pay | Admitting: *Deleted

## 2019-08-24 MED ORDER — DIAZEPAM 5 MG PO TABS: ORAL_TABLET | ORAL | 0 refills | Status: DC

## 2019-08-24 NOTE — Telephone Encounter (Signed)
Call received from patient's husband requesting something to help patient "relax" the night before her port placement d/t pt.'s increased anxiety.  Dr. [name] notified and order received for pt to get Valium 5 mg PO prior to the night of port placement.

## 2019-08-24 NOTE — Telephone Encounter (Signed)
Call placed back to patient's husband, Amanda [name] to inform him that a prescription for Valium will be sent to [facility] on [address] and [address] for pt.  Pt.'s husband appreciative of call back and has no further questions or concerns at this time.

## 2019-08-28 ENCOUNTER — Encounter (HOSPITAL_COMMUNITY): Payer: PPO

## 2019-08-29 ENCOUNTER — Other Ambulatory Visit: Payer: Self-pay | Admitting: Physician Assistant

## 2019-08-29 ENCOUNTER — Telehealth: Payer: Self-pay | Admitting: Adult Health

## 2019-08-29 NOTE — Telephone Encounter (Signed)
Amanda [name], OT, called stating pt is declining OT visit this week due to surgery. Amanda [name] is requesting to have visit moved to next week. Please advise.    [phone] secure line

## 2019-08-30 ENCOUNTER — Other Ambulatory Visit: Payer: Self-pay

## 2019-08-30 ENCOUNTER — Other Ambulatory Visit: Payer: Self-pay | Admitting: Hematology & Oncology

## 2019-08-30 ENCOUNTER — Ambulatory Visit (HOSPITAL_COMMUNITY)
Admission: RE | Admit: 2019-08-30 | Discharge: 2019-08-30 | Disposition: A | Discharge status: Home / Self Care | Payer: PPO | Source: Ambulatory Visit | Attending: Hematology & Oncology | Admitting: Hematology & Oncology

## 2019-08-30 ENCOUNTER — Encounter (HOSPITAL_COMMUNITY): Payer: Self-pay

## 2019-08-30 DIAGNOSIS — F329 Major depressive disorder, single episode, unspecified: Secondary | ICD-10-CM | POA: Insufficient documentation

## 2019-08-30 DIAGNOSIS — Z7902 Long term (current) use of antithrombotics/antiplatelets: Secondary | ICD-10-CM | POA: Insufficient documentation

## 2019-08-30 DIAGNOSIS — Z79899 Other long term (current) drug therapy: Secondary | ICD-10-CM | POA: Diagnosis not present

## 2019-08-30 DIAGNOSIS — I10 Essential (primary) hypertension: Secondary | ICD-10-CM | POA: Insufficient documentation

## 2019-08-30 DIAGNOSIS — I252 Old myocardial infarction: Secondary | ICD-10-CM | POA: Insufficient documentation

## 2019-08-30 DIAGNOSIS — M109 Gout, unspecified: Secondary | ICD-10-CM | POA: Insufficient documentation

## 2019-08-30 DIAGNOSIS — I2510 Atherosclerotic heart disease of native coronary artery without angina pectoris: Secondary | ICD-10-CM | POA: Diagnosis not present

## 2019-08-30 DIAGNOSIS — E669 Obesity, unspecified: Secondary | ICD-10-CM | POA: Diagnosis not present

## 2019-08-30 DIAGNOSIS — D45 Polycythemia vera: Secondary | ICD-10-CM | POA: Insufficient documentation

## 2019-08-30 DIAGNOSIS — Z87891 Personal history of nicotine dependence: Secondary | ICD-10-CM | POA: Diagnosis not present

## 2019-08-30 DIAGNOSIS — E785 Hyperlipidemia, unspecified: Secondary | ICD-10-CM | POA: Diagnosis not present

## 2019-08-30 DIAGNOSIS — K7460 Unspecified cirrhosis of liver: Secondary | ICD-10-CM | POA: Insufficient documentation

## 2019-08-30 DIAGNOSIS — E119 Type 2 diabetes mellitus without complications: Secondary | ICD-10-CM | POA: Diagnosis not present

## 2019-08-30 DIAGNOSIS — G510 Bell's palsy: Secondary | ICD-10-CM | POA: Insufficient documentation

## 2019-08-30 DIAGNOSIS — Z794 Long term (current) use of insulin: Secondary | ICD-10-CM | POA: Diagnosis not present

## 2019-08-30 DIAGNOSIS — Z452 Encounter for adjustment and management of vascular access device: Secondary | ICD-10-CM | POA: Diagnosis not present

## 2019-08-30 HISTORY — PX: IR IMAGING GUIDED PORT INSERTION: IMG5740

## 2019-08-30 LAB — CBC
HCT: 37.3 % (ref 36.0–46.0)
Hemoglobin: 12.1 g/dL (ref 12.0–15.0)
MCH: 30.9 pg (ref 26.0–34.0)
MCHC: 32.4 g/dL (ref 30.0–36.0)
MCV: 95.2 fL (ref 80.0–100.0)
Platelets: 136 10*3/uL — ABNORMAL LOW (ref 150–400)
RBC: 3.92 MIL/uL (ref 3.87–5.11)
RDW: 18.2 % — ABNORMAL HIGH (ref 11.5–15.5)
WBC: 12.2 10*3/uL — ABNORMAL HIGH (ref 4.0–10.5)
nRBC: 0.7 % — ABNORMAL HIGH (ref 0.0–0.2)

## 2019-08-30 LAB — PROTIME-INR
INR: 1.1 (ref 0.8–1.2)
Prothrombin Time: 13.9 seconds (ref 11.4–15.2)

## 2019-08-30 LAB — GLUCOSE, CAPILLARY: Glucose-Capillary: 144 mg/dL — ABNORMAL HIGH (ref 70–99)

## 2019-08-30 MED ORDER — VANCOMYCIN HCL IN DEXTROSE 1-5 GM/200ML-% IV SOLN
1000.0000 mg | Freq: Once | INTRAVENOUS | Status: AC
  Administered 2019-08-30: 18:00:00 1000 mg via INTRAVENOUS

## 2019-08-30 MED ORDER — HEPARIN SOD (PORK) LOCK FLUSH 100 UNIT/ML IV SOLN
INTRAVENOUS | Status: AC
  Filled 2019-08-30: qty 5

## 2019-08-30 MED ORDER — VANCOMYCIN HCL IN DEXTROSE 1-5 GM/200ML-% IV SOLN
INTRAVENOUS | Status: AC
  Administered 2019-08-30: 1000 mg via INTRAVENOUS
  Filled 2019-08-30: qty 200

## 2019-08-30 MED ORDER — FENTANYL CITRATE (PF) 100 MCG/2ML IJ SOLN
INTRAMUSCULAR | Status: AC
  Filled 2019-08-30: qty 2

## 2019-08-30 MED ORDER — SODIUM CHLORIDE 0.9 % IV SOLN
INTRAVENOUS | Status: DC
  Administered 2019-08-30: 14:00:00 via INTRAVENOUS

## 2019-08-30 MED ORDER — LIDOCAINE-EPINEPHRINE (PF) 2 %-1:200000 IJ SOLN
INTRAMUSCULAR | Status: AC
  Filled 2019-08-30: qty 20

## 2019-08-30 MED ORDER — MIDAZOLAM HCL 2 MG/2ML IJ SOLN
INTRAMUSCULAR | Status: AC | PRN
  Administered 2019-08-30: 1 mg via INTRAVENOUS
  Administered 2019-08-30: 0.5 mg via INTRAVENOUS

## 2019-08-30 MED ORDER — MIDAZOLAM HCL 2 MG/2ML IJ SOLN
INTRAMUSCULAR | Status: AC
  Administered 2019-08-30: 1 mg
  Filled 2019-08-30: qty 2

## 2019-08-30 MED ORDER — FENTANYL CITRATE (PF) 100 MCG/2ML IJ SOLN
INTRAMUSCULAR | Status: AC | PRN
  Administered 2019-08-30: 50 ug via INTRAVENOUS
  Administered 2019-08-30: 25 ug via INTRAVENOUS

## 2019-08-30 MED ORDER — MIDAZOLAM HCL 2 MG/2ML IJ SOLN
INTRAMUSCULAR | Status: AC
  Filled 2019-08-30: qty 2

## 2019-08-30 MED ORDER — SODIUM CHLORIDE 0.9 % IV SOLN
INTRAVENOUS | Status: AC | PRN
  Administered 2019-08-30: 10 mL/h via INTRAVENOUS

## 2019-08-30 NOTE — H&P (Signed)
Chief Complaint: Patient was seen in consultation today for polycythemia vera/Port-a-cath placement.  Referring Physician(s): Ennever,Peter R  Supervising Physician: Sandi Mariscal  Patient Status: [facility] - Out-pt  History of Present Illness: Amanda [name] is a 78 y.o. female with a past medical history of hypertension, hyperlipidemia, MI, Bell's palsy, cirrhosis, HSV, polycythemia vera, diabetes mellitus, gout, obesity, and depression. Her polycythemia vera is managed by Dr. [name]. She is currently on Jakafi once daily for management. In addition, she has poor IV access.  IR requested by Dr. [name] for possible image-guided Port-a-cath placement. Patient awake and alert laying in bed. She is very anxious about procedure. Denies fever, chills, chest pain, dyspnea, abdominal pain, or headache.  Currently taking Plavix 75 mg once daily- last dose 08/24/2019.   Past Medical History:  Diagnosis Date  . Allergic rhinitis 01/23/2016  . C. difficile colitis   . CAD (coronary artery disease)    a. s/p STEMI in 01/2015 with 95% LCx stenosis and distal 80% LCx stenosis (DESx2 placed)  . Cancer ([place])    SKIN  . Candidiasis of skin 09/30/2014  . Cirrhosis ([place])   . Depression   . Diabetes mellitus 2008  . Gout   . Herpes simplex   . Hyperglycemia 05/31/2013  . Hyperlipidemia   . Hypertension   . Myocardial infarction ([place])   . Neuromuscular disorder ([place])    BELL PALSY  . Obesity   . Polycythemia    Dr. Elease Hashimoto- [facility]  . Psoriasis     Past Surgical History:  Procedure Laterality Date  . ANKLE FRACTURE SURGERY    . BIOPSY  05/26/2018   Procedure: BIOPSY;  Surgeon: [name], MD;  Location: [facility] ENDOSCOPY;  Service: Endoscopy;;  . BREAST SURGERY Left    milk duct  . CATARACT EXTRACTION, BILATERAL    . COLONOSCOPY    . ESOPHAGOGASTRODUODENOSCOPY N/A 05/26/2018   Procedure: ESOPHAGOGASTRODUODENOSCOPY (EGD);  Surgeon: [name], MD;  Location: [facility] ENDOSCOPY;  Service:  Endoscopy;  Laterality: N/A;  . IR PARACENTESIS  05/25/2018  . LEFT HEART CATHETERIZATION WITH CORONARY ANGIOGRAM N/A 01/27/2015   Procedure: LEFT HEART CATHETERIZATION WITH CORONARY ANGIOGRAM;  Surgeon: [name], MD;  Location: [facility] CATH LAB;  Service: Cardiovascular;  Laterality: N/A;  . TONSILLECTOMY    . TOTAL ABDOMINAL HYSTERECTOMY W/ BILATERAL SALPINGOOPHORECTOMY     for heavy periods with appendectomy    Allergies: Penicillins, Lisinopril, Tape, Doxycycline, and Latex  Medications: Prior to Admission medications   Medication Sig Start Date End Date Taking? Authorizing Provider  acetaminophen (TYLENOL) 325 MG tablet Take 2 tablets (650 mg total) by mouth every 6 (six) hours as needed for mild pain (or Fever >/= 101). 05/27/18  Yes Georgette Shell, MD  Blood Glucose Monitoring Suppl (ACCU-CHEK GUIDE) w/Device KIT 1 kit by Does not apply route as directed. 01/25/18  Yes [name], MD  clopidogrel (PLAVIX) 75 MG tablet TAKE 1 TABLET(75 MG) BY MOUTH DAILY Patient taking differently: Take 75 mg by mouth daily.  11/03/18  Yes Nafziger, Tommi Rumps, NP  diazepam (VALIUM) 5 MG tablet Valium 5 mg by mouth at bedtime as needed. 08/24/19  Yes Ennever, Rudell Cobb, MD  ezetimibe (ZETIA) 10 MG tablet TAKE 1 TABLET(10 MG) BY MOUTH DAILY Patient taking differently: Take 10 mg by mouth daily.  06/15/19  Yes [name], MD  furosemide (LASIX) 40 MG tablet Take 40 mg by mouth daily. Take along with 20 mg tablet 07/19/19  Yes [provider]  glucose blood (ACCU-CHEK GUIDE) test strip Use to check blood sugars 3 times daily 01/25/18  Yes [name], MD  insulin NPH Human (NOVOLIN N) 100 UNIT/ML injection Inject into the skin.   Yes [provider]  latanoprost (XALATAN) 0.005 % ophthalmic solution Place 1 drop into both eyes at bedtime.  03/23/19  Yes [provider]  mirtazapine (REMERON) 15 MG tablet Take 1 tablet (15 mg total) by mouth at bedtime. 08/17/19  Yes  Nafziger, Tommi Rumps, NP  ONETOUCH VERIO test strip USE TO TEST BLOOD SUGAR THREE TIMES DAILY 05/01/18  Yes [name], MD  oxyCODONE-acetaminophen (PERCOCET/ROXICET) 5-325 MG tablet Take 1-2 tablets by mouth every 8 (eight) hours as needed for severe pain. 02/02/19  Yes Nafziger, Tommi Rumps, NP  potassium chloride (KLOR-CON 10) 10 MEQ tablet Take 1 tablet (10 mEq total) by mouth daily. 03/07/19  Yes Milus Banister, MD  promethazine (PHENERGAN) 25 MG tablet Take 0.5 tablets (12.5 mg total) by mouth every 6 (six) hours as needed for nausea or vomiting. 05/28/18  Yes Emokpae, Ejiroghene E, MD  JAKAFI 25 MG tablet TAKE 1 TABLET (25 MG TOTAL) BY MOUTH 2 (TWO) TIMES DAILY. Patient taking differently: Take 25 mg by mouth 2 (two) times daily.  01/22/19   Volanda Napoleon, MD  lidocaine-prilocaine (EMLA) cream Apply 1 application topically as needed. Please apply EMLA over the Port-A-Cath site 1 hour prior to coming to our office. 08/17/19   Volanda Napoleon, MD     Family History  Problem Relation Age of Onset  . Diabetes Mother   . Heart disease Mother        CAD  . Hyperlipidemia Mother   . Hypertension Mother   . Kidney disease Mother   . Kidney disease Father   . Breast cancer Maternal Aunt   . Heart disease Maternal Grandmother   . Heart disease Other        maternal aunts and uncles  . Colon cancer Neg Hx   . Esophageal cancer Neg Hx     Social History   Socioeconomic History  . Marital status: Married    Spouse name: Amanda [name]  . Number of children: 2  . Years of education: 2 yr colle  . Highest education level: Not on file  Occupational History  . Occupation: housewife  Social Needs  . Financial resource strain: Not on file  . Food insecurity    Worry: Not on file    Inability: Not on file  . Transportation needs    Medical: Not on file    Non-medical: Not on file  Tobacco Use  . Smoking status: Former Smoker    Years: 48.00    Types: Cigarettes    Quit date: 10/18/1986    Years  since quitting: 32.8  . Smokeless tobacco: Never Used  Substance and Sexual Activity  . Alcohol use: No    Alcohol/week: 0.0 standard drinks  . Drug use: No  . Sexual activity: Yes    Partners: Male    Birth control/protection: None  Lifestyle  . Physical activity    Days per week: Not on file    Minutes per session: Not on file  . Stress: Not on file  Relationships  . Social Herbalist on phone: Not on file    Gets together: Not on file    Attends religious service: Not on file    Active member of club or organization: Not on file    Attends  meetings of clubs or organizations: Not on file    Relationship status: Not on file  Other Topics Concern  . Not on file  Social History Narrative   2 caffeine drink per day.  No regular exercise.     Retired from the bank   2 children (Daughter and a son) son has morbid obesity   2 grandchildren   3 great grandchildren     Review of Systems: A 12 point ROS discussed and pertinent positives are indicated in the HPI above.  All other systems are negative.  Review of Systems  Constitutional: Negative for chills and fever.  Respiratory: Negative for shortness of breath and wheezing.   Cardiovascular: Negative for chest pain and palpitations.  Gastrointestinal: Negative for abdominal pain.  Neurological: Negative for headaches.  Psychiatric/Behavioral: Negative for behavioral problems and confusion.    Vital Signs: BP (!) 150/87   Pulse 88   Temp 98.6 F (37 C) (Oral)   Resp 17   SpO2 98%   Physical Exam Vitals signs and nursing note reviewed.  Constitutional:      General: She is not in acute distress.    Appearance: Normal appearance.  Cardiovascular:     Rate and Rhythm: Normal rate and regular rhythm.     Heart sounds: Normal heart sounds. No murmur.  Pulmonary:     Effort: Pulmonary effort is normal. No respiratory distress.     Breath sounds: Normal breath sounds. No wheezing.  Skin:    General: Skin is  warm and dry.  Neurological:     Mental Status: She is alert and oriented to person, place, and time.      MD Evaluation Airway: WNL Heart: WNL Abdomen: WNL Chest/ Lungs: WNL ASA  Classification: 2 Mallampati/Airway Score: Two   Imaging: US Renal  Result Date: 08/10/2019 CLINICAL DATA:  Initial evaluation for acute renal failure. EXAM: RENAL / URINARY TRACT ULTRASOUND COMPLETE COMPARISON:  Prior CT from 05/23/2018. FINDINGS: Right Kidney: Renal measurements: 10.0 x 4.1 x 5.3 cm = volume: 115 mL . Echogenicity within normal limits. No mass or hydronephrosis visualized. Left Kidney: Renal measurements: 10.4 x 4.7 x 5.6 cm = volume: 143 mL. Echogenicity within normal limits. No mass or hydronephrosis visualized. Bladder: Appears normal for degree of bladder distention. Other: None. IMPRESSION: Normal renal ultrasound.  No hydronephrosis. Electronically Signed   By: Jeannine Boga M.D.   On: 08/10/2019 23:59   Dg Chest Port 1 View  Result Date: 08/10/2019 CLINICAL DATA:  Weakness, loss of appetite EXAM: PORTABLE CHEST 1 VIEW COMPARISON:  10/02/2018 FINDINGS: Cardiomegaly. Both lungs are clear. The visualized skeletal structures are unremarkable. IMPRESSION: Cardiomegaly without acute abnormality of the lungs in AP portable projection. Electronically Signed   By: Eddie Candle M.D.   On: 08/10/2019 20:32   Vas [place] Burnard Bunting With/wo Tbi  Result Date: 08/21/2019 LOWER EXTREMITY DOPPLER STUDY Indications: Peripheral artery disease. High Risk Factors: Hypertension, hyperlipidemia, Diabetes, past history of                    smoking, prior MI, coronary artery disease.  Performing Technologist: Caralee Ates BA, RVT, RDMS  Examination Guidelines: A complete evaluation includes at minimum, Doppler waveform signals and systolic blood pressure reading at the level of bilateral brachial, anterior tibial, and posterior tibial arteries, when vessel segments are accessible. Bilateral testing is considered  an integral part of a complete examination. Photoelectric Plethysmograph (PPG) waveforms and toe systolic pressure readings are included as required and  additional duplex testing as needed. Limited examinations for reoccurring indications may be performed as noted.  ABI Findings: +---------+------------------+-----+---------+----------------+ Right    Rt Pressure (mmHg)IndexWaveform Comment          +---------+------------------+-----+---------+----------------+ Brachial 182                    triphasicNon-Compressible +---------+------------------+-----+---------+----------------+ PTA      255               1.40 triphasicNon-Compressible +---------+------------------+-----+---------+----------------+ DP       255               1.40 triphasicNon-Compressible +---------+------------------+-----+---------+----------------+ Genia Harold               1.17 Normal                    +---------+------------------+-----+---------+----------------+ +---------+------------------+-----+---------+----------------+ Left     Lt Pressure (mmHg)IndexWaveform Comment          +---------+------------------+-----+---------+----------------+ Brachial 182                    triphasicNon-Compressible +---------+------------------+-----+---------+----------------+ PTA      255               1.40 triphasicNon-Compressible +---------+------------------+-----+---------+----------------+ DP       255               1.40 triphasicNon-Compressible +---------+------------------+-----+---------+----------------+ Doristine Devoid Toe255               1.40 Abnormal Non-Compressible +---------+------------------+-----+---------+----------------+ +-------+----------------+----------------+------------+------------+ ABI/TBIToday's ABI     Today's TBI     Previous ABIPrevious TBI +-------+----------------+----------------+------------+------------+ Right  Non-Compressible1.17                                      +-------+----------------+----------------+------------+------------+ Left   Non-CompressibleNon-Compressible                         +-------+----------------+----------------+------------+------------+  Summary: Right: Resting right ankle-brachial index indicates noncompressible right lower extremity arteries. The right toe-brachial index is normal. RT great toe pressure = 213 mmHg. Right TBI may be falsely elevated due to calcification.  Left: Resting left ankle-brachial index indicates noncompressible left lower extremity arteries. The left toe-brachial index is abnormal. LT Great toe pressure = 255 mmHg. Left TBI is non-compressible.  *See table(s) above for measurements and observations.  Electronically signed by Monica Martinez MD on 08/21/2019 at 10:05:39 AM.    Final     Labs:  CBC: Recent Labs    08/10/19 1906 08/11/19 1630 08/12/19 0223 08/30/19 1315  WBC 21.3* 16.9* 15.3* 12.2*  HGB 13.5 12.3 12.0 12.1  HCT 42.3 38.4 38.1 37.3  PLT 263 210 194 136*    COAGS: Recent Labs    09/29/18 0920 04/19/19 0958 08/30/19 1315  INR 1.2* 1.2* 1.1    BMP: Recent Labs    08/10/19 1906 08/11/19 1240 08/12/19 0223 08/13/19 0641  NA 136 135 136 137  K 5.1 5.0 5.6* 4.8  CL 105 107 109 112*  CO2 19* 19* 17* 16*  GLUCOSE 78 106* 100* 111*  BUN 43* 44* 39* 27*  CALCIUM 9.6 9.0 8.9 8.6*  CREATININE 3.05* 2.84* 2.57* 1.78*  GFRNONAA 14* 15* 17* 27*  GFRAA 16* 18* 20* 31*    LIVER FUNCTION TESTS: Recent Labs    04/19/19 0958 04/25/19 0832 07/09/19 0932 08/10/19 1906  BILITOT 0.9 1.2 1.1 1.1  AST _0 ALT _1 ALKPHOS 89 95 77 63  PROT 7.2 7.1 7.4 8.0  ALBUMIN 4.4 4.5 4.4 4.6    TUMOR MARKERS: Recent Labs    04/19/19 0958  AFPTM 3.5    Assessment and Plan:  [name], poor venous access. Plan for image-guided Port-a-cath placement today in IR. Patient is NPO. Afebrile. Last dose Plavix 08/24/2019- ok to  proceed per IR protocol. INR 1.1 today.  Risks and benefits of image guided port-a-catheter placement were discussed with the patient including, but not limited to bleeding, infection, pneumothorax, or fibrin sheath development and need for additional procedures. All of the patient's questions were answered, patient is agreeable to proceed. Consent signed and in chart.   Thank you for this interesting consult.  I greatly enjoyed meeting KATHRYN COSBY and look forward to participating in their care.  A copy of this report was sent to the requesting provider on this date.  Electronically Signed: Earley Abide, PA-C 08/30/2019, 3:07 PM   I spent a total of 40 Minutes in face to face in clinical consultation, greater than 50% of which was counseling/coordinating care for polycythemia vera/Port-a-cath placement.

## 2019-08-30 NOTE — Procedures (Signed)
Pre Procedure Dx: Poor venous access Post Procedural Dx: Same  Successful placement of right IJ approach port-a-cath with tip at the superior caval atrial junction. The catheter is ready for immediate use.  Estimated Blood Loss: Minimal  Complications: None immediate.  Ronny Bacon, MD Pager #: [phone]

## 2019-08-30 NOTE — Sedation Documentation (Signed)
Assumed care of pt

## 2019-08-30 NOTE — Discharge Instructions (Addendum)
Do not use EMLA cream over the skin glue on your new port. The petroleum in the EMLA cream will dissolve the skin glue. The skin will pull apart over your new port and result in an infection. Use ice in a zip lock bag over your new port for 2-3 minutes before the nurses at the [facility] access your new port. The nurses will let you know when to start using the EMLA cream.     Implanted Port Insertion, Care After This sheet gives you information about how to care for yourself after your procedure. Your health care provider may also give you more specific instructions. If you have problems or questions, contact your health care provider. What can I expect after the procedure? After the procedure, it is common to have:  Discomfort at the port insertion site.  Bruising on the skin over the port. This should improve over 3-4 days. Follow these instructions at home: [facility] care   Take care of the port as told by your health care provider. Ask your health care provider if you or a family member can get training for taking care of the port at home. A home health care nurse may also take care of the port.  Make sure to remember what type of port you have. Incision care      Follow instructions from your health care provider about how to take care of your port insertion site. Make sure you: ? Wash your hands with soap and water before and after you change your bandage (dressing). If soap and water are not available, use hand sanitizer. ? Change your dressing as told by your health care provider. ? Leave  skin glue and steri strips (little tapes) in place. These skin closures may need to stay in place for 2 weeks or longer. If adhesive strip edges start to loosen and curl up, you may trim the loose edges. Do not remove adhesive strips completely unless your health care provider tells you to do that.  Check your port insertion site every day for signs of infection. Check for: ? Redness,  swelling, or pain. ? Fluid or blood. ? Warmth. ? Pus or a bad smell. Activity  Return to your normal activities as told by your health care provider. Ask your health care provider what activities are safe for you.  Do not lift anything that is heavier than 10 lb (4.5 kg), or the limit that you are told, until your health care provider says that it is safe. General instructions  Take over-the-counter and prescription medicines only as told by your health care provider.  Do not take baths, swim, or use a hot tub until your health care provider approves. Ask your health care provider if you may take showers. You may only be allowed to take sponge baths.  Do not drive for 24 hours if you were given a sedative during your procedure.  Wear a medical alert bracelet in case of an emergency. This will tell any health care providers that you have a port.  Keep all follow-up visits as told by your health care provider. This is important. Contact a health care provider if:  You cannot flush your port with saline as directed, or you cannot draw blood from the port.  You have a fever or chills.  You have redness, swelling, or pain around your port insertion site.  You have fluid or blood coming from your port insertion site.  Your port insertion site feels warm  to the touch.  You have pus or a bad smell coming from the port insertion site. Get help right away if:  You have chest pain or shortness of breath.  You have bleeding from your port that you cannot control. Summary  Take care of the port as told by your health care provider. Keep the manufacturer's information card with you at all times.  Change your dressing as told by your health care provider.  Contact a health care provider if you have a fever or chills or if you have redness, swelling, or pain around your port insertion site.  Keep all follow-up visits as told by your health care provider. This information is not  intended to replace advice given to you by your health care provider. Make sure you discuss any questions you have with your health care provider. Document Released: 07/25/2013 Document Revised: 05/02/2018 Document Reviewed: 05/02/2018 Elsevier Patient Education  [place].     Moderate Conscious Sedation, Adult, Care After These instructions provide you with information about caring for yourself after your procedure. Your health care provider may also give you more specific instructions. Your treatment has been planned according to current medical practices, but problems sometimes occur. Call your health care provider if you have any problems or questions after your procedure. What can I expect after the procedure? After your procedure, it is common:  To feel sleepy for several hours.  To feel clumsy and have poor balance for several hours.  To have poor judgment for several hours.  To vomit if you eat too soon. Follow these instructions at home: For at least 24 hours after the procedure:   Do not: ? Participate in activities where you could fall or become injured. ? Drive. ? Use heavy machinery. ? Drink alcohol. ? Take sleeping pills or medicines that cause drowsiness. ? Make important decisions or sign legal documents. ? Take care of children on your own.  Rest. Eating and drinking  Follow the diet recommended by your health care provider.  If you vomit: ? Drink water, juice, or soup when you can drink without vomiting. ? Make sure you have little or no nausea before eating solid foods. General instructions  Have a responsible adult stay with you until you are awake and alert.  Take over-the-counter and prescription medicines only as told by your health care provider.  If you smoke, do not smoke without supervision.  Keep all follow-up visits as told by your health care provider. This is important. Contact a health care provider if:  You keep feeling  nauseous or you keep vomiting.  You feel light-headed.  You develop a rash.  You have a fever. Get help right away if:  You have trouble breathing. This information is not intended to replace advice given to you by your health care provider. Make sure you discuss any questions you have with your health care provider. Document Released: 07/25/2013 Document Revised: 09/16/2017 Document Reviewed: 01/24/2016 Elsevier Patient Education  2020 [employer].

## 2019-08-30 NOTE — Telephone Encounter (Signed)
That is fine 

## 2019-08-30 NOTE — Telephone Encounter (Signed)
Left a detailed message on identified machine informing Amanda [name] to reschedule appointment for next week.  Nothing further needed.

## 2019-09-03 ENCOUNTER — Other Ambulatory Visit: Payer: PPO

## 2019-09-03 ENCOUNTER — Ambulatory Visit: Payer: PPO | Admitting: Hematology & Oncology

## 2019-09-04 ENCOUNTER — Other Ambulatory Visit: Payer: Self-pay | Admitting: Hematology & Oncology

## 2019-09-04 DIAGNOSIS — D45 Polycythemia vera: Secondary | ICD-10-CM

## 2019-09-05 ENCOUNTER — Ambulatory Visit (HOSPITAL_COMMUNITY): Payer: Self-pay | Admitting: Licensed Clinical Social Worker

## 2019-09-06 ENCOUNTER — Telehealth: Payer: Self-pay | Admitting: Endocrinology

## 2019-09-06 MED ORDER — INSULIN PEN NEEDLE 29G X 12MM MISC: 11 refills | Status: DC

## 2019-09-06 MED FILL — JAKAFI 25 MG TABLET: 25 | 30 days supply | Qty: 60 | Fill #0

## 2019-09-06 NOTE — Telephone Encounter (Signed)
MEDICATION: pen needles  PHARMACY:  [facility] on [address]  IS THIS A 90 DAY SUPPLY :   IS PATIENT OUT OF MEDICATION:   IF NOT; HOW MUCH IS LEFT:   LAST APPOINTMENT DATE: @7 /23/2020  NEXT APPOINTMENT DATE:@Visit  date not found  DO WE HAVE YOUR PERMISSION TO LEAVE A DETAILED MESSAGE:  OTHER COMMENTS:    **Let patient know to contact pharmacy at the end of the day to make sure medication is ready. **  ** Please notify patient to allow 48-72 hours to process**  **Encourage patient to contact the pharmacy for refills or they can request refills through [facility]**

## 2019-09-06 NOTE — Telephone Encounter (Signed)
RX sent

## 2019-09-06 NOTE — Telephone Encounter (Signed)
I believe this refill request was intended for you and Dr. Cruzita Lederer

## 2019-09-07 ENCOUNTER — Telehealth: Payer: Self-pay | Admitting: Internal Medicine

## 2019-09-07 MED ORDER — BD PEN NEEDLE NANO U/F 32G X 4 MM MISC: 11 refills | Status: DC

## 2019-09-07 NOTE — Telephone Encounter (Signed)
RX sent

## 2019-09-07 NOTE — Telephone Encounter (Signed)
Patient's husband Amanda [name] called re: RX for needles that was last sent were for the wrong needles. Please send the following RX:  MEDICATION: BD Nano Ultra Fine Pen Needles  18m x 32G  [facility] [phone](Phone) [phone](Fax)    IS THIS A 90 DAY SUPPLY : Yes  IS PATIENT OUT OF MEDICATION: Yes  IF NOT; HOW MUCH IS LEFT: 0  LAST APPOINTMENT DATE: @11 /19/2020  NEXT APPOINTMENT DATE:@Visit  date not found  DO WE HAVE YOUR PERMISSION TO LEAVE A DETAILED MESSAGE: Yes  OTHER COMMENTS:    **Let patient know to contact pharmacy at the end of the day to make sure medication is ready. **  ** Please notify patient to allow 48-72 hours to process**  **Encourage patient to contact the pharmacy for refills or [facility]*

## 2019-09-10 DIAGNOSIS — S8291XA Unspecified fracture of right lower leg, initial encounter for closed fracture: Secondary | ICD-10-CM | POA: Diagnosis not present

## 2019-09-10 DIAGNOSIS — R531 Weakness: Secondary | ICD-10-CM | POA: Diagnosis not present

## 2019-09-19 ENCOUNTER — Encounter: Payer: Self-pay | Admitting: Cardiovascular Disease

## 2019-09-19 ENCOUNTER — Other Ambulatory Visit: Payer: Self-pay

## 2019-09-19 ENCOUNTER — Ambulatory Visit: Payer: PPO | Admitting: Cardiovascular Disease

## 2019-09-19 VITALS — BP 159/75 | HR 91 | Ht 64.0 in | Wt 201.0 lb

## 2019-09-19 DIAGNOSIS — I10 Essential (primary) hypertension: Secondary | ICD-10-CM | POA: Diagnosis not present

## 2019-09-19 DIAGNOSIS — D45 Polycythemia vera: Secondary | ICD-10-CM | POA: Diagnosis not present

## 2019-09-19 DIAGNOSIS — E785 Hyperlipidemia, unspecified: Secondary | ICD-10-CM

## 2019-09-19 DIAGNOSIS — N184 Chronic kidney disease, stage 4 (severe): Secondary | ICD-10-CM

## 2019-09-19 DIAGNOSIS — I2510 Atherosclerotic heart disease of native coronary artery without angina pectoris: Secondary | ICD-10-CM | POA: Diagnosis not present

## 2019-09-19 DIAGNOSIS — I5032 Chronic diastolic (congestive) heart failure: Secondary | ICD-10-CM | POA: Diagnosis not present

## 2019-09-19 MED ORDER — AMLODIPINE BESYLATE 5 MG PO TABS: 5.0000 mg | ORAL_TABLET | Freq: Every day | ORAL | 3 refills | Status: DC

## 2019-09-19 NOTE — Progress Notes (Signed)
Patient ID: VICTORINA KABLE, female   DOB: 1941-10-05, 78 y.o.   MRN: [id number]     HPI: Amanda [name] is a 78 y.o. female who presents to the office today for a 14 month follow-up cardiology evaluation.    Ms. Anfinson has a history of hypertension, type 2 diabetes mellitus, polycythemia vera, as well as peripheral neuropathy.  She developed new onset chest pain on 01/27/2015 and was found have 3 mm ST segment elevation by EMS.  During transport to the hospital she developed idioventricular rhythm which was treated with amiodarone bolus.  I took her acutely to the cardiac catheterization laboratory on 01/27/2015.  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  She underwent successful PCI intervention with  insertion of a  Xience Alpine 3.023 mm DES stent postdilated to 3.15 millimeters in the mid lesion and the distal stenosis was treated with a 2.518 mm Xience Alpine DES stent postdilated to 2.57 mm.  It was felt that her idioventricular rhythm was probably the result of spontaneous reperfusion in route to the hospital.  She initially was discharged after 3 days to [facility] rehabilitation which did she not find to be helpful and went home 3 days later with home health care coming to her house.  During her hospitalization, an echo Doppler study revealed an EF of 50-55%.  She had abnormal tissue Doppler.  There is mitral annular calcification and moderate LA dilatation.  She had abnormal posterior lateral and septal strain.  When I saw her last year she was having difficulty with bloating and diarrhea.  She denied any recurrent episodes of chest pain.  She admits to exertional shortness of breath.  She is not very active and does not ambulate well.   After one year of full dose dual antiplatelet therapy, she recently  was started on reduced dose of Brilinta at 60 mg twice a day per Pegasys trial data.  She was hospitalized from April 12 through 01/30/2016 a several week history of shortness of breath.  Her hospitalization she was seen by cardiology and was not felt to be due to CHF.  She presented with markedly elevated glucose with metabolic acidosis but without an anion gap.  She has continued to be on Jakafi for  polycythemia vera.  She was found to have a 7 x 6 mm nodule opacify in the superior segment of the right lower lobe on CT and a hollow of noncontrast chest CT at 6-12 months was recommended.  She has continued to have fatigue.  She saw Dr. [name] of her in follow-up evaluation and he was concerned that additional evaluation of her myeloproliferative disorder and according to the patient.   With reference to her fatigue, she is extremely tired.  At times she may go to bed at 6:30 at night.  She does snore.  Her sleep is nonrestorative.  She admits to daytime sleepiness.  She has never had an evaluation for sleep apnea and when I last saw her, I strongly recommended a sleep study for further evaluation  She was hospitalized [place] [facility] .  During the summer.  Multiple family members were coughing week prior to the hospitalization.  Reportedly, she spent 8 days in the hospital and developed kidney insufficiency.  Reportedly, her creatinine had increased to 4 before trending down.  Venous Dopplers were negative for DVT.  A right renal mass was seen, which she has undergone subsequent evaluation.  She saw Dr. Inda Castle in August 2018.  An MRI of her kidney was negative for mass.  She also has seen Dr. [name] for her polycythemia vera.  When I last saw her in October 2018 she was without chest pain.  She had not been successful with weight loss.  She had seen a liver specialist and was told of possibly having hepatic steatosis rather than cirrhosis.   Over the past  year, apparently she was hospitalized in June 2019 and felt to have a mini stroke.  He had presented with intermittent lightheadedness and dizziness and had weakness in her legs.  Infarcts were noted on MRI but felt most likely not responsible for her presenting lower extremity weakness.  Her bilateral carotid Dopplers did not reveal high-grade abnormality.  A 2D echo Doppler study showed an EF of 60 to 65% with grade 1 diastolic dysfunction.  A bubble study was inadequate.  I  saw her in December 2019 after not having seen her since October 2018.  During my evaluation her blood pressure was elevated  on amlodipine 10 mg, spironolactone 100 mg, losartan 100 mg and furosemide 20 mg. She has significant 2+ bilateral edema. This may be contributed by her amlodipine and I have recommended reduction of her amlodipine dose down to 5 mg. I have recommended further titration of Lasix to 40 mg daily. I have also recommended support stockings with at least 15 to 20 mm pressure support. She was on simvastatin for hyperlipidemia.  I recommended the addition of Zetia 10 mg in attempt to achieve an LDL goal less than 70 and also discussed with her data regarding Repatha. She had continued to be on Plavix.  She is diabetic on dulaglutide in addition to insulin and has continued to see Dr. [name] for her PCV and remained on Jakafi.  She has subsequently seen Dr. [name] regarding cirrhosis of the liver presumably NASH who has been adjusting her diuretic to reduce swelling and had a telemedicine visit this week Lasix was increased to 60 mg daily and she has continued to be on spironolactone 100 mg twice daily.  I last evaluated her in a telemedicine visit on Mar 08, 2019.  At that time she denied any recurrent chest pain, PND orthopnea.  She was experiencing leg swelling.  Since I last saw her she had broken her right ankle and was in a cast.  She also has noted some right knee discomfort.  She had been undergoing physical  therapy 2 days/week and has been trying to walk.  She denied any palpitations, presyncope, or syncope.  Since I last saw her, she was hospitalized from October 23 through August 13, 2019.  She was hypotensive, and dehydrated presented with worsening renal function with a creatinine at 3.05.  Her amlodipine, spironolactone, and losartan were discontinued.  She was treated with IV fluids.  CBC revealed her chronic leukocytosis 21,300.  Since her hospitalization, she continues to be evaluated frequently by Dr. [name].  Due to very difficult blood draws, she had a Port-A-Cath inserted in the right chest wall.  She is scheduled to undergo laboratory and Port-A-Cath flushing tomorrow as well as a follow-up office visit with Dr. [name].  Recently, she has noticed her blood pressure increasing.  She has been taking Lasix 60 mg daily.  She continues  to be on insulin for diabetes.  She is on Ree Shay for her polycythemia vera.  She denies recurrent chest pain symptoms.  Her lower extremity edema had improved with increased Lasix.  She has difficulty placing support stockings on her legs and therefore has not been able to do this.  She presents for follow-up Cardiologic evaluation.   Past Medical History:  Diagnosis Date   Allergic rhinitis 01/23/2016   C. difficile colitis    CAD (coronary artery disease)    a. s/p STEMI in 01/2015 with 95% LCx stenosis and distal 80% LCx stenosis (DESx2 placed)   Cancer ([place])    SKIN   Candidiasis of skin 09/30/2014   Cirrhosis ([place])    Depression    Diabetes mellitus 2008   Gout    Herpes simplex    Hyperglycemia 05/31/2013   Hyperlipidemia    Hypertension    Myocardial infarction ([place])    Neuromuscular disorder (HCC)    BELL PALSY   Obesity    Polycythemia    Dr. Elease Hashimoto- [facility]   Psoriasis     Past Surgical History:  Procedure Laterality Date   ANKLE FRACTURE SURGERY     BIOPSY  05/26/2018   Procedure: BIOPSY;  Surgeon:  [name], MD;  Location: [place];  Service: Endoscopy;;   BREAST SURGERY Left    milk duct   CATARACT EXTRACTION, BILATERAL     COLONOSCOPY     ESOPHAGOGASTRODUODENOSCOPY N/A 05/26/2018   Procedure: ESOPHAGOGASTRODUODENOSCOPY (EGD);  Surgeon: [name], MD;  Location[facility] ENDOSCOPY;  Service: Endoscopy;  Laterality: N/A;   IR IMAGING GUIDED PORT INSERTION  08/30/2019   IR PARACENTESIS  05/25/2018   LEFT HEART CATHETERIZATION WITH CORONARY ANGIOGRAM N/A 01/27/2015   Procedure: LEFT HEART CATHETERIZATION WITH CORONARY ANGIOGRAM;  Surgeon: [name], MD;  Location: [facility] CATH LAB;  Service: Cardiovascular;  Laterality: N/A;   TONSILLECTOMY     TOTAL ABDOMINAL HYSTERECTOMY W/ BILATERAL SALPINGOOPHORECTOMY     for heavy periods with appendectomy    Allergies  Allergen Reactions   Penicillins Swelling    Has patient had a PCN reaction causing immediate rash, facial/tongue/throat swelling, SOB or lightheadedness with hypotension: No Has patient had a PCN reaction causing severe rash involving mucus membranes or skin necrosis: No Has patient had a PCN reaction that required hospitalization: No Has patient had a PCN reaction occurring within the last 10 years: No If all of the above answers are "NO", then may proceed with Cephalosporin use.   Lisinopril Cough   Tape Hives   Doxycycline Hives, Swelling and Rash   Latex Hives, Itching and Rash    Current Outpatient Medications  Medication Sig Dispense Refill   acetaminophen (TYLENOL) 325 MG tablet Take 2 tablets (650 mg total) by mouth every 6 (six) hours as needed for mild pain (or Fever >/= 101).     Blood Glucose Monitoring Suppl (ACCU-CHEK GUIDE) w/Device KIT 1 kit by Does not apply route as directed. 1 kit 0   clopidogrel (PLAVIX) 75 MG tablet TAKE 1 TABLET(75 MG) BY MOUTH DAILY (Patient taking differently: Take 75 mg by mouth daily. ) 90 tablet 3   diazepam (VALIUM) 5 MG tablet Valium 5 mg by mouth at bedtime  as needed. 3 tablet 0   ezetimibe (ZETIA) 10 MG tablet TAKE 1 TABLET(10 MG) BY MOUTH DAILY (Patient taking differently: Take 10 mg by mouth daily. ) 90 tablet 0   furosemide (LASIX) 40 MG tablet Take 40 mg by mouth  daily. Take along with 20 mg tablet     glucose blood (ACCU-CHEK GUIDE) test strip Use to check blood sugars 3 times daily 100 each 12   insulin NPH Human (NOVOLIN N) 100 UNIT/ML injection Inject into the skin.     Insulin Pen Needle (BD PEN NEEDLE NANO U/F) 32G X 4 MM MISC Use to inject insulin 2 times a day 200 each 11   JAKAFI 25 MG tablet TAKE 1 TABLET (25 MG TOTAL) BY MOUTH 2 TIMES DAILY. 60 tablet 6   latanoprost (XALATAN) 0.005 % ophthalmic solution Place 1 drop into both eyes at bedtime.      lidocaine-prilocaine (EMLA) cream Apply 1 application topically as needed. Please apply EMLA over the Port-A-Cath site 1 hour prior to coming to our office. 30 g 5   mirtazapine (REMERON) 15 MG tablet Take 1 tablet (15 mg total) by mouth at bedtime. 90 tablet 0   ONETOUCH VERIO test strip USE TO TEST BLOOD SUGAR THREE TIMES DAILY 300 each 2   oxyCODONE-acetaminophen (PERCOCET/ROXICET) 5-325 MG tablet Take 1-2 tablets by mouth every 8 (eight) hours as needed for severe pain. 15 tablet 0   potassium chloride (KLOR-CON 10) 10 MEQ tablet Take 1 tablet (10 mEq total) by mouth daily. 30 tablet 6   promethazine (PHENERGAN) 25 MG tablet Take 0.5 tablets (12.5 mg total) by mouth every 6 (six) hours as needed for nausea or vomiting. 30 tablet 1   amLODipine (NORVASC) 5 MG tablet Take 1 tablet (5 mg total) by mouth daily. 90 tablet 3   No current facility-administered medications for this visit.     Social History   Socioeconomic History   Marital status: Married    Spouse name: [name]   Number of children: 2   Years of education: 2 yr colle   Highest education level: Not on file  Occupational History   Occupation: housewife  Social Designer, fashion/clothing strain: Not on  file   Food insecurity    Worry: Not on file    Inability: Not on file   Transportation needs    Medical: Not on file    Non-medical: Not on file  Tobacco Use   Smoking status: Former Smoker    Years: 48.00    Types: Cigarettes    Quit date: 10/18/1986    Years since quitting: 32.9   Smokeless tobacco: Never Used  Substance and Sexual Activity   Alcohol use: No    Alcohol/week: 0.0 standard drinks   Drug use: No   Sexual activity: Yes    Partners: Male    Birth control/protection: None  Lifestyle   Physical activity    Days per week: Not on file    Minutes per session: Not on file   Stress: Not on file  Relationships   Social connections    Talks on phone: Not on file    Gets together: Not on file    Attends religious service: Not on file    Active member of club or organization: Not on file    Attends meetings of clubs or organizations: Not on file    Relationship status: Not on file   Intimate partner violence    Fear of current or ex partner: Not on file    Emotionally abused: Not on file    Physically abused: Not on file    Forced sexual activity: Not on file  Other Topics Concern   Not on file  Social History Narrative  2 caffeine drink per day.  No regular exercise.     Retired from the bank   2 children (Daughter and a son) son has morbid obesity   2 grandchildren   3 great grandchildren    She is  originally from [place], [place]  Family History  Problem Relation Age of Onset   Diabetes Mother    Heart disease Mother        CAD   Hyperlipidemia Mother    Hypertension Mother    Kidney disease Mother    Kidney disease Father    Breast cancer Maternal Aunt    Heart disease Maternal Grandmother    Heart disease Other        maternal aunts and uncles   Colon cancer Neg Hx    Esophageal cancer Neg Hx     ROS General: Negative; No fevers, chills, or night sweats HEENT: Negative; No changes in vision or hearing, sinus  congestion, difficulty swallowing Pulmonary: Negative; No cough, wheezing, shortness of breath, hemoptysis Cardiovascular: See HPI: Positive lower extremity edema GI: Positive for abdominal bloating and occasional to frequent diarrhea. GU: Negative; No dysuria, hematuria, or difficulty voiding Musculoskeletal: Negative; no myalgias, joint pain, or weakness Hematologic: Positive for polycythemia vera and anemia and leukocytosis. Endocrine: Negative; no heat/cold intolerance; no diabetes, Neuro: Possible mini stroke. Skin: Negative; No rashes or skin lesions Psychiatric: Negative; No behavioral problems, depression Sleep: Positive for snoring, nonrestorative sleep,daytime sleepiness, and hypersomnolence; no bruxism, restless legs, hypnogognic hallucinations. Other comprehensive 14 point system review is negative   Physical Exam BP (!) 159/75    Pulse 91    Ht 5' 4"  (1.626 m)    Wt 201 lb (91.2 kg)    SpO2 97%    BMI 34.50 kg/m    Repeat blood pressure by me was elevated at 160/74  Wt Readings from Last 3 Encounters:  09/19/19 201 lb (91.2 kg)  08/21/19 211 lb (95.7 kg)  08/17/19 211 lb (95.7 kg)   General: Alert, oriented, no distress.  Skin: normal turgor, no rashes, warm and dry HEENT: Normocephalic, atraumatic. Pupils equal round and reactive to light; sclera anicteric; extraocular muscles intact;  Nose without nasal septal hypertrophy Mouth/Parynx benign; Mallinpatti scale 3 Neck: No JVD, no carotid bruits; normal carotid upstroke Lungs: clear to ausculatation and percussion; no wheezing or rales Chest wall: without tenderness to palpitation; Port-A-Cath in place right chest wall Heart: PMI not displaced, RRR, s1 s2 normal, 1/6 systolic murmur, no diastolic murmur, no rubs, gallops, thrills, or heaves Abdomen: soft, nontender; no hepatosplenomehaly, BS+; abdominal aorta nontender and not dilated by palpation. Back: no CVA tenderness Pulses 2+ Musculoskeletal: full range of  motion, normal strength, no joint deformities Extremities: Trace to 1+ residual ankle swelling; improved; no clubbing cyanosis, Homan's sign negative  Neurologic: grossly nonfocal; Cranial nerves grossly wnl Psychologic: Normal mood and affect   ECG (independently read by me): Normal sinus rhythm at 91 bpm.  Increased QTc interval of 42 ms.  No ectopy.  December 2019 ECG (independently read by me): Normal sinus rhythm with an isolated PVC.  QTc interval 468 ms.  October 2018 ECG (independently read by me): Sinus rhythm at 82 bpm with 1 PVC.  QTC 476 ms.  June 2017 ECG (independently read by me): Normal sinus rhythm 80 bpm.  No ECG evidence for prior MI.  QTC interval 463 ms.  July 2016 ECG (independently read by me):  Normal sinus rhythm at 96 bpm. No significant ST changes.  No ECG evidence of prior MI.  LABS:  BMP Latest Ref Rng & Units 08/13/2019 08/12/2019 08/11/2019  Glucose 70 - 99 mg/dL 111(H) 100(H) 106(H)  BUN 8 - 23 mg/dL 27(H) 39(H) 44(H)  Creatinine 0.44 - 1.00 mg/dL 1.78(H) 2.57(H) 2.84(H)  BUN/Creat Ratio 12 - 28 - - -  Sodium 135 - 145 mmol/L 137 136 135  Potassium 3.5 - 5.1 mmol/L 4.8 5.6(H) 5.0  Chloride 98 - 111 mmol/L 112(H) 109 107  CO2 22 - 32 mmol/L 16(L) 17(L) 19(L)  Calcium 8.9 - 10.3 mg/dL 8.6(L) 8.9 9.0    Hepatic Function Latest Ref Rng & Units 08/10/2019 07/09/2019 04/25/2019  Total Protein 6.5 - 8.1 g/dL 8.0 7.4 7.1  Albumin 3.5 - 5.0 g/dL 4.6 4.4 4.5  AST 15 - 41 U/L 20 26 31   ALT 0 - 44 U/L 11 11 20   Alk Phosphatase 38 - 126 U/L 63 77 95  Total Bilirubin 0.3 - 1.2 mg/dL 1.1 1.1 1.2  Bilirubin, Direct 0.0 - 0.2 mg/dL - - -   CBC Latest Ref Rng & Units 08/30/2019 08/12/2019 08/11/2019  WBC 4.0 - 10.5 K/uL 12.2(H) 15.3(H) 16.9(H)  Hemoglobin 12.0 - 15.0 g/dL 12.1 12.0 12.3  Hematocrit 36.0 - 46.0 % 37.3 38.1 38.4  Platelets 150 - 400 K/uL 136(L) 194 210   Lab Results  Component Value Date   MCV 95.2 08/30/2019   MCV 95.7 08/12/2019   MCV 95.3  08/11/2019    Lab Results  Component Value Date   TSH 4.66 (H) 11/16/2018    BNP    Component Value Date/Time   BNP 62.7 08/10/2019 1906   BNP 56.3 01/26/2016 1340    ProBNP    Component Value Date/Time   PROBNP 311.6 (H) 01/29/2014 1203     Lipid Panel     Component Value Date/Time   CHOL 167 03/23/2018 0221   TRIG 247 (H) 03/23/2018 0221   HDL 25 (L) 03/23/2018 0221   CHOLHDL 6.7 03/23/2018 0221   VLDL 49 (H) 03/23/2018 0221   LDLCALC 93 03/23/2018 0221   LDLDIRECT 103.0 02/14/2017 1122     RADIOLOGY: Vas [place] Abi With/wo Tbi  Result Date: 08/21/2019 LOWER EXTREMITY DOPPLER STUDY Indications: Peripheral artery disease. High Risk Factors: Hypertension, hyperlipidemia, Diabetes, past history of                    smoking, prior MI, coronary artery disease.  Performing Technologist: Caralee Ates BA, RVT, RDMS  Examination Guidelines: A complete evaluation includes at minimum, Doppler waveform signals and systolic blood pressure reading at the level of bilateral brachial, anterior tibial, and posterior tibial arteries, when vessel segments are accessible. Bilateral testing is considered an integral part of a complete examination. Photoelectric Plethysmograph (PPG) waveforms and toe systolic pressure readings are included as required and additional duplex testing as needed. Limited examinations for reoccurring indications may be performed as noted.  ABI Findings: +---------+------------------+-----+---------+----------------+  Right     Rt Pressure (mmHg) Index Waveform  Comment           +---------+------------------+-----+---------+----------------+  Brachial  182                      triphasic Non-Compressible  +---------+------------------+-----+---------+----------------+  PTA       255                1.40  triphasic Non-Compressible  +---------+------------------+-----+---------+----------------+  DP        255  1.40  triphasic Non-Compressible   +---------+------------------+-----+---------+----------------+  Great Toe 213                1.17  Normal                      +---------+------------------+-----+---------+----------------+ +---------+------------------+-----+---------+----------------+  Left      Lt Pressure (mmHg) Index Waveform  Comment           +---------+------------------+-----+---------+----------------+  Brachial  182                      triphasic Non-Compressible  +---------+------------------+-----+---------+----------------+  PTA       255                1.40  triphasic Non-Compressible  +---------+------------------+-----+---------+----------------+  DP        255                1.40  triphasic Non-Compressible  +---------+------------------+-----+---------+----------------+  Great Toe 255                1.40  Abnormal  Non-Compressible  +---------+------------------+-----+---------+----------------+ +-------+----------------+----------------+------------+------------+  ABI/TBI Today's ABI      Today's TBI      Previous ABI Previous TBI  +-------+----------------+----------------+------------+------------+  Right   Non-Compressible 1.17                                        +-------+----------------+----------------+------------+------------+  Left    Non-Compressible Non-Compressible                            +-------+----------------+----------------+------------+------------+  Summary: Right: Resting right ankle-brachial index indicates noncompressible right lower extremity arteries. The right toe-brachial index is normal. RT great toe pressure = 213 mmHg. Right TBI may be falsely elevated due to calcification.  Left: Resting left ankle-brachial index indicates noncompressible left lower extremity arteries. The left toe-brachial index is abnormal. LT Great toe pressure = 255 mmHg. Left TBI is non-compressible.  *See table(s) above for measurements and observations.  Electronically signed by Monica Martinez MD on 08/21/2019 at  10:05:39 AM.    Final    Ir Imaging Guided Port Insertion  Result Date: 08/30/2019 INDICATION: History of polycythemia vera and poor venous access. Please perform image guided Port a catheter placement to allow for durable intravenous access for frequent blood draws and medication administration. EXAM: IMPLANTED PORT A CATH PLACEMENT WITH ULTRASOUND AND FLUOROSCOPIC GUIDANCE COMPARISON:  None MEDICATIONS: Vancomycin 1 gm IV; The antibiotic was administered within an appropriate time interval prior to skin puncture. ANESTHESIA/SEDATION: Moderate (conscious) sedation was employed during this procedure. A total of Versed 1.5 mg and Fentanyl 75 mcg was administered intravenously. Moderate Sedation Time: 30 minutes. The patient's level of consciousness and vital signs were monitored continuously by radiology nursing throughout the procedure under my direct supervision. CONTRAST:  None FLUOROSCOPY TIME:  36 seconds (35 mGy) COMPLICATIONS: None immediate. PROCEDURE: The procedure, risks, benefits, and alternatives were explained to the patient. Questions regarding the procedure were encouraged and answered. The patient understands and consents to the procedure. The right neck and chest were prepped with chlorhexidine in a sterile fashion, and a sterile drape was applied covering the operative field. Maximum barrier sterile technique with sterile gowns and gloves were used for the procedure. A timeout was performed prior to the initiation of the procedure. Local anesthesia was  provided with 1% lidocaine with epinephrine. After creating a small venotomy incision, a micropuncture kit was utilized to access the internal jugular vein. Real-time ultrasound guidance was utilized for vascular access including the acquisition of a permanent ultrasound image documenting patency of the accessed vessel. The microwire was utilized to measure appropriate catheter length. A subcutaneous port pocket was then created along the upper  chest wall utilizing a combination of sharp and blunt dissection. The pocket was irrigated with sterile saline. A single lumen ISP power injectable port was chosen for placement. The 8 Fr catheter was tunneled from the port pocket site to the venotomy incision. The port was placed in the pocket. The external catheter was trimmed to appropriate length. At the venotomy, an 8 Fr peel-away sheath was placed over a guidewire under fluoroscopic guidance. The catheter was then placed through the sheath and the sheath was removed. Final catheter positioning was confirmed and documented with a fluoroscopic spot radiograph. The port was accessed with a Huber needle, aspirated and flushed with heparinized saline. The venotomy site was closed with an interrupted 4-0 Vicryl suture. The port pocket incision was closed with interrupted 2-0 Vicryl suture and the skin was opposed with a running subcuticular 4-0 Vicryl suture. Dermabond and Steri-strips were applied to both incisions. Dressings were placed. The patient tolerated the procedure well without immediate post procedural complication. FINDINGS: After catheter placement, the tip lies within the superior cavoatrial junction. The catheter aspirates and flushes normally and is ready for immediate use. IMPRESSION: Successful placement of a right internal jugular approach power injectable Port-A-Cath. The catheter is ready for immediate use. Electronically Signed   By: Sandi Mariscal M.D.   On: 08/30/2019 18:19    IMPRESSION:  1. Essential hypertension   2. Coronary artery disease involving native coronary artery of native heart without angina pectoris   3. [name] ([place])   4. Hyperlipidemia LDL goal <70   5. Chronic diastolic CHF (congestive heart failure) ([place])   6. CKD (chronic kidney disease) stage 4, GFR 15-29 ml/min [facility])     ASSESSMENT AND PLAN: Ms Genita Nilsson is an 78 year-old Caucasian female who is originally from [place], [place] who presented on  01/27/2015 with a left circumflex STEMI.  She most likely had some spontaneous reperfusion which led to transient idioventricular rhythm.  She underwent successful intervention.  Her echo Doppler study 2 days later showed an EF of 50-55% with fairly normal wall motion.  When I initially saw her in the office she was not well beta blocked and further titrated her beta blocker to metoprolol 100 mg twice a day.  In the past she had required significant blood pressure medications including I  amlodipine 10 mg, spironolactone 100 mg, losartan 100 mg and furosemide 20 mg.  When last evaluated by me in May 2020 she was fairly stable.  She recently was hospitalized in the setting of hypotension, dehydration and had acute kidney injury with creatinine increasing to 3.05.  She was hydrated and antihypertensive medications were discontinued.  Renal function improved at discharge to 1.78 and potassium improved to 4.8 from a level of 5.6.  Her blood pressure today is now elevated at 160/70.  I have recommended resumption of amlodipine 5 mg which should not affect her renal function.  She is to have laboratory tomorrow via her newly placed Port-A-Cath and will be seeing Dr. [name] in follow-up.  Hopefully renal function will be further improved.  She is not having any anginal symptoms.  She denies PND orthopnea.  Her ECG remained stable.  If additional blood pressure medication is necessary she may benefit from the addition of beta-blocker therapy particularly with her resting pulse at 91.  She is diabetic on insulin.  She continues to be on Plavix for CAD.  She has cirrhosis and is not on statin therapy.  She is on Zetia for hyperlipidemia with target LDL less than 70.  She has moderate obesity with a BMI of 34.5.  Weight loss was recommended.  I will see her in 6 months for cardiology reevaluation or sooner as needed.  Time spent: 30 minutes  [name], MD, [facility]  09/19/2019 2:20 PM

## 2019-09-19 NOTE — Patient Instructions (Signed)
Medication Instructions:  Start taking Amlodipine 5 mg daily Continue all other medications *If you need a refill on your cardiac medications before your next appointment, please call your pharmacy*  Lab Work: None ordered   Testing/Procedures: None ordered  Follow-Up: At [facility], you and your health needs are our priority.  As part of our continuing mission to provide you with exceptional heart care, we have created designated Provider Care Teams.  These Care Teams include your primary Cardiologist (physician) and Advanced Practice Providers (APPs -  Physician Assistants and Nurse Practitioners) who all work together to provide you with the care you need, when you need it.  Your next appointment:  6 months    Call in March to schedule June appointment   The format for your next appointment:  Office    Provider:  [facility]

## 2019-09-20 ENCOUNTER — Inpatient Hospital Stay: Payer: PPO

## 2019-09-20 ENCOUNTER — Inpatient Hospital Stay: Payer: PPO | Attending: Family | Admitting: Hematology & Oncology

## 2019-09-20 ENCOUNTER — Encounter: Payer: Self-pay | Admitting: Hematology & Oncology

## 2019-09-20 VITALS — BP 118/98 | HR 80 | Temp 97.7°F | Resp 18

## 2019-09-20 DIAGNOSIS — M7989 Other specified soft tissue disorders: Secondary | ICD-10-CM | POA: Insufficient documentation

## 2019-09-20 DIAGNOSIS — D45 Polycythemia vera: Secondary | ICD-10-CM

## 2019-09-20 DIAGNOSIS — I2510 Atherosclerotic heart disease of native coronary artery without angina pectoris: Secondary | ICD-10-CM

## 2019-09-20 DIAGNOSIS — Z95828 Presence of other vascular implants and grafts: Secondary | ICD-10-CM

## 2019-09-20 LAB — CMP (CANCER CENTER ONLY)
ALT: 12 U/L (ref 0–44)
AST: 35 U/L (ref 15–41)
Albumin: 4.3 g/dL (ref 3.5–5.0)
Alkaline Phosphatase: 64 U/L (ref 38–126)
Anion gap: 8 (ref 5–15)
BUN: 11 mg/dL (ref 8–23)
CO2: 28 mmol/L (ref 22–32)
Calcium: 9.1 mg/dL (ref 8.9–10.3)
Chloride: 106 mmol/L (ref 98–111)
Creatinine: 1.31 mg/dL — ABNORMAL HIGH (ref 0.44–1.00)
GFR, Est AFR Am: 44 mL/min — ABNORMAL LOW (ref >60)
GFR, Estimated: 38 mL/min — ABNORMAL LOW (ref >60)
Glucose, Bld: 90 mg/dL (ref 70–99)
Potassium: 3.2 mmol/L — ABNORMAL LOW (ref 3.5–5.1)
Sodium: 142 mmol/L (ref 135–145)
Total Bilirubin: 1.3 mg/dL — ABNORMAL HIGH (ref 0.3–1.2)
Total Protein: 6.7 g/dL (ref 6.5–8.1)

## 2019-09-20 LAB — CBC WITH DIFFERENTIAL (CANCER CENTER ONLY)
Abs Immature Granulocytes: 0.99 10*3/uL — ABNORMAL HIGH (ref 0.00–0.07)
Basophils Absolute: 0.1 10*3/uL (ref 0.0–0.1)
Basophils Relative: 1 %
Eosinophils Absolute: 0 10*3/uL (ref 0.0–0.5)
Eosinophils Relative: 0 %
HCT: 33.2 % — ABNORMAL LOW (ref 36.0–46.0)
Hemoglobin: 11.1 g/dL — ABNORMAL LOW (ref 12.0–15.0)
Immature Granulocytes: 7 %
Lymphocytes Relative: 26 %
Lymphs Abs: 3.7 10*3/uL (ref 0.7–4.0)
MCH: 31 pg (ref 26.0–34.0)
MCHC: 33.4 g/dL (ref 30.0–36.0)
MCV: 92.7 fL (ref 80.0–100.0)
Monocytes Absolute: 1.1 10*3/uL — ABNORMAL HIGH (ref 0.1–1.0)
Monocytes Relative: 8 %
Neutro Abs: 8.3 10*3/uL — ABNORMAL HIGH (ref 1.7–7.7)
Neutrophils Relative %: 58 %
Platelet Count: 152 10*3/uL (ref 150–400)
RBC: 3.58 MIL/uL — ABNORMAL LOW (ref 3.87–5.11)
RDW: 18.6 % — ABNORMAL HIGH (ref 11.5–15.5)
WBC Count: 14.2 10*3/uL — ABNORMAL HIGH (ref 4.0–10.5)
nRBC: 1.5 % — ABNORMAL HIGH (ref 0.0–0.2)

## 2019-09-20 LAB — SAVE SMEAR(SSMR), FOR PROVIDER SLIDE REVIEW

## 2019-09-20 MED ORDER — HEPARIN SOD (PORK) LOCK FLUSH 100 UNIT/ML IV SOLN
500.0000 [IU] | Freq: Once | INTRAVENOUS | Status: AC
  Administered 2019-09-20: 500 [IU] via INTRAVENOUS
  Filled 2019-09-20: qty 5

## 2019-09-20 MED ORDER — SODIUM CHLORIDE 0.9% FLUSH
10.0000 mL | Freq: Once | INTRAVENOUS | Status: AC
  Administered 2019-09-20: 10 mL via INTRAVENOUS
  Filled 2019-09-20: qty 10

## 2019-09-20 NOTE — Patient Instructions (Signed)
[employer], Adult A central line is a soft, flexible tube (catheter) that can be used to collect blood for testing or to give medicine through a vein. The tip of the central line ends in a large vein just above the heart (vena cava). A central line may be placed because:  You need to get medicines or fluids through an IV tube for a long period of time.  You need nutrition but cannot eat or absorb nutrients.  The veins in your hands or arms are hard to access.  You need a blood transfusion.  You need chemotherapy or dialysis. Types of central lines There are four main types of central lines:  Peripherally inserted central catheter (PICC) line. This type is used for intermediate access to long-term access of one week or more. It can be used to draw blood and give fluids or medicines. A PICC looks like an IV tube, but it goes up the arm to the heart. It is usually inserted in the upper arm and taped in place on the arm.  Tunneled central line. This type is used for long-term therapy and dialysis. It is placed in a large vein in the neck, chest, or groin. It is inserted through a small incision made over the vein, then it is advanced into the heart. It is tunneled under the skin and brought out through a second incision.  Non-tunneled central line. This type is used for short-term access, usually of a maximum of 7 days. It is often used in the emergency department. It is inserted in the neck, chest, or groin.  Implanted port. This type is used for long-term therapy. It can stay in place longer than other types of central lines. It is normally inserted in the upper chest, but it can also be placed in the upper arm or the abdomen. It is inserted and removed with surgery, and it is accessed using a needle. The type of central line that you receive depends on how long you will need it, your medical condition, and the condition of your veins. What are the risks? Using any type of central line has  risks to be aware of, including:  Infection.  A blood clot that blocks the central line or forms in the vein and travels to the heart.  Bleeding from the place where the central line was inserted.  Developing a hole or crack within the central line. If this happens, the central line will need to be replaced.  Central line failure. Follow these instructions at home: Flushing and cleaning the central line   Follow instructions from your health care provider about flushing and cleaning the central line and the area around it.  Only flush with sterile supplies. Use supplies from your health care provider, a pharmacy, or another source that is recommended by your health care provider.  Before you flush the central line or clean the central line or the area around it: ? Wash your hands with soap and water or alcohol-based hand sanitizer. ? Clean the central line hub with rubbing alcohol. Caring for the incision or central line site  Check your incision or central line site every day for signs of infection. Check for: ? Redness, swelling, or pain. ? Fluid or blood. ? Warmth. ? Pus or a bad smell. General instructions  Follow instructions from your health care provider for the type of device that you have.  Keep the tube clamped, unless it is being used.  Keep your supplies in  a clean, dry location.  If the central line accidentally gets pulled on, make sure: ? The bandage (dressing) is okay. ? There is no bleeding. ? The line has not been pulled out.  Do not use scissors or sharp objects near the tube.  Do not swim or let the central line soak in the tub.  Ask your health care provider what activities are safe for you. You may be restricted from lifting or making repetitive arm movements on the side of your central line.  Take over-the-counter and prescription medicines only as told by your health care provider.  Keep the insertion site of your central line clean and dry at  all times.  Keep your dressing dry. If it gets wet, have it changed as soon as possible.  Keep all follow-up visits as told by your health care provider. This is important. Disposal of supplies  Throw away any syringes in a disposal container that is meant for sharp items (sharps container). You can buy a sharps container from a pharmacy, or you can make one by using an empty hard plastic bottle with a cover.  Place any used dressings or infusion bags into a plastic bag. Throw that bag in the trash. Contact a health care provider if:  You have redness, swelling, or pain around your incision.  You have fluid or blood coming from your incision.  Your incision feels warm to the touch.  You have pus or a bad smell coming from your incision. Get help right away if:  You have: ? A fever or chills. ? Shortness of breath. ? Chest pain or a racing heart beat. ? Swelling in your neck, face, chest, or arm on the side of your central line.  You feel dizzy or you faint.  Your incision or central line site has red streaks spreading away from the area.  Your incision or central line site is bleeding and does not stop.  Your central line is difficult to flush or will not flush.  You do not get a blood return from the central line.  Your central line gets loose or damaged or comes out.  Your catheter leaks when flushed or when fluids are infused into it. Summary  A central line is a soft, flexible tube (catheter) that can be used to give medicine through a vein.  Follow specific instructions from your health care provider for the type of device that you have.  Keep the insertion site of your central line clean and dry at all times.  Keep the tube clamped, unless it is being used. This information is not intended to replace advice given to you by your health care provider. Make sure you discuss any questions you have with your health care provider. Document Released: 11/25/2005  Document Revised: 01/24/2019 Document Reviewed: 11/05/2016 Elsevier Patient Education  2020 [employer].

## 2019-09-20 NOTE — Progress Notes (Signed)
Hematology and Oncology Follow Up Visit  Amanda [name] [id number] July 15, 1941 78 y.o. 09/20/2019   Principle Diagnosis:  [name] - JAK2 (+)  Current Therapy:   Jakafi 25 mg po q day   Interim History:  Amanda [name] is here today for follow-up.  She really looks quite good.  Shockingly, and she actually did go for the Port-A-Cath.  She did have a placed.  She is quite happy about having this now.  She does not need to be stuck several times.  She has had very, very poor peripheral access.  She had a very quiet Thanksgiving.  She has been incredibly cautious with the coronavirus.  She is not letting anybody come over to her house.  She has had no problems with bleeding.  She has had no problems with nausea or vomiting.  Her blood sugars are really doing well.  She is now on Trulicity which is helping.  She has had no fever.  She has had no cough.  She has had no headache.  She does have leg swelling which has been chronic.  She seems to be tolerating the [facility] quite well.    Overall, I would say her performance status is ECOG 2.    Medications:  Allergies as of 09/20/2019      Reactions   Penicillins Swelling   Has patient had a PCN reaction causing immediate rash, facial/tongue/throat swelling, SOB or lightheadedness with hypotension: No Has patient had a PCN reaction causing severe rash involving mucus membranes or skin necrosis: No Has patient had a PCN reaction that required hospitalization: No Has patient had a PCN reaction occurring within the last 10 years: No If all of the above answers are "NO", then may proceed with Cephalosporin use.   Lisinopril Cough   Tape Hives   Doxycycline Hives, Swelling, Rash   Latex Hives, Itching, Rash      Medication List       Accurate as of September 20, 2019  2:34 PM. If you have any questions, ask your nurse or doctor.        Accu-Chek Guide w/Device Kit 1 kit by Does not apply route as directed.   acetaminophen 325 MG  tablet Commonly known as: TYLENOL Take 2 tablets (650 mg total) by mouth every 6 (six) hours as needed for mild pain (or Fever >/= 101).   amLODipine 5 MG tablet Commonly known as: NORVASC Take 1 tablet (5 mg total) by mouth daily.   BD Pen Needle Nano U/F 32G X 4 MM Misc Generic drug: Insulin Pen Needle Use to inject insulin 2 times a day   clopidogrel 75 MG tablet Commonly known as: PLAVIX TAKE 1 TABLET(75 MG) BY MOUTH DAILY What changed: See the new instructions.   diazepam 5 MG tablet Commonly known as: VALIUM Valium 5 mg by mouth at bedtime as needed.   ezetimibe 10 MG tablet Commonly known as: ZETIA TAKE 1 TABLET(10 MG) BY MOUTH DAILY What changed: See the new instructions.   furosemide 40 MG tablet Commonly known as: LASIX Take 40 mg by mouth daily. Take along with 20 mg tablet   glucose blood test strip Commonly known as: Accu-Chek Guide Use to check blood sugars 3 times daily   OneTouch Verio test strip Generic drug: glucose blood USE TO TEST BLOOD SUGAR THREE TIMES DAILY   insulin NPH Human 100 UNIT/ML injection Commonly known as: NOVOLIN N Inject into the skin.   Jakafi 25 MG tablet Generic drug: ruxolitinib  phosphate TAKE 1 TABLET (25 MG TOTAL) BY MOUTH 2 TIMES DAILY.   latanoprost 0.005 % ophthalmic solution Commonly known as: XALATAN Place 1 drop into both eyes at bedtime.   lidocaine-prilocaine cream Commonly known as: EMLA Apply 1 application topically as needed. Please apply EMLA over the Port-A-Cath site 1 hour prior to coming to our office.   mirtazapine 15 MG tablet Commonly known as: REMERON Take 1 tablet (15 mg total) by mouth at bedtime.   oxyCODONE-acetaminophen 5-325 MG tablet Commonly known as: PERCOCET/ROXICET Take 1-2 tablets by mouth every 8 (eight) hours as needed for severe pain.   potassium chloride 10 MEQ tablet Commonly known as: Klor-Con 10 Take 1 tablet (10 mEq total) by mouth daily.   promethazine 25 MG tablet  Commonly known as: PHENERGAN Take 0.5 tablets (12.5 mg total) by mouth every 6 (six) hours as needed for nausea or vomiting.       Allergies:  Allergies  Allergen Reactions  . Penicillins Swelling    Has patient had a PCN reaction causing immediate rash, facial/tongue/throat swelling, SOB or lightheadedness with hypotension: No Has patient had a PCN reaction causing severe rash involving mucus membranes or skin necrosis: No Has patient had a PCN reaction that required hospitalization: No Has patient had a PCN reaction occurring within the last 10 years: No If all of the above answers are "NO", then may proceed with Cephalosporin use.  Marland Kitchen Lisinopril Cough  . Tape Hives  . Doxycycline Hives, Swelling and Rash  . Latex Hives, Itching and Rash    Past Medical History, Surgical history, Social history, and Family History were reviewed and updated.  Review of Systems: Review of Systems  Constitutional: Negative.   HENT: Negative.   Eyes: Negative.   Respiratory: Negative.   Cardiovascular: Negative.   Gastrointestinal: Negative.   Genitourinary: Negative.   Musculoskeletal: Negative.   Skin: Negative.   Neurological: Negative.   Endo/Heme/Allergies: Negative.   Psychiatric/Behavioral: Negative.    Marland Kitchen   Physical Exam:  vitals were not taken for this visit.   Wt Readings from Last 3 Encounters:  09/19/19 201 lb (91.2 kg)  08/21/19 211 lb (95.7 kg)  08/17/19 211 lb (95.7 kg)    Physical Exam Vitals signs reviewed.  HENT:     Head: Normocephalic and atraumatic.  Eyes:     Pupils: Pupils are equal, round, and reactive to light.  Neck:     Musculoskeletal: Normal range of motion.  Cardiovascular:     Rate and Rhythm: Normal rate and regular rhythm.     Heart sounds: Normal heart sounds.  Pulmonary:     Effort: Pulmonary effort is normal.     Breath sounds: Normal breath sounds.  Abdominal:     General: Bowel sounds are normal.     Palpations: Abdomen is soft.   Musculoskeletal: Normal range of motion.        General: No tenderness or deformity.  Lymphadenopathy:     Cervical: No cervical adenopathy.  Skin:    General: Skin is warm and dry.     Findings: No erythema or rash.  Neurological:     Mental Status: She is alert and oriented to person, place, and time.  Psychiatric:        Behavior: Behavior normal.        Thought Content: Thought content normal.        Judgment: Judgment normal.      Lab Results  Component Value Date   WBC 14.2 (  H) 09/20/2019   HGB 11.1 (L) 09/20/2019   HCT 33.2 (L) 09/20/2019   MCV 92.7 09/20/2019   PLT 152 09/20/2019   Lab Results  Component Value Date   FERRITIN 219 07/09/2019   IRON 104 07/09/2019   TIBC 316 07/09/2019   UIBC 213 07/09/2019   IRONPCTSAT 33 07/09/2019   Lab Results  Component Value Date   RETICCTPCT 1.8 04/25/2019   RBC 3.58 (L) 09/20/2019   No results found for: Nils Pyle [facility] Lab Results  Component Value Date   IGA 233 03/29/2014   No results found for: Kathrynn Ducking, MSPIKE, SPEI   Chemistry      Component Value Date/Time   NA 137 08/13/2019 0641   NA 140 10/12/2017 1013   NA 138 01/21/2017 0806   K 4.8 08/13/2019 0641   K 3.7 10/12/2017 1013   K 4.1 01/21/2017 0806   CL 112 (H) 08/13/2019 0641   CL 103 10/12/2017 1013   CL 103 02/16/2013 0900   CO2 16 (L) 08/13/2019 0641   CO2 25 10/12/2017 1013   CO2 21 (L) 01/21/2017 0806   BUN 27 (H) 08/13/2019 0641   BUN 10 10/12/2017 1013   BUN 12.2 01/21/2017 0806   CREATININE 1.78 (H) 08/13/2019 0641   CREATININE 1.78 (H) 07/09/2019 0932   CREATININE 0.9 10/12/2017 1013   CREATININE 1.0 01/21/2017 0806      Component Value Date/Time   CALCIUM 8.6 (L) 08/13/2019 0641   CALCIUM 8.7 10/12/2017 1013   CALCIUM 9.3 01/21/2017 0806   ALKPHOS 63 08/10/2019 1906   ALKPHOS 97 (H) 10/12/2017 1013   ALKPHOS 112 01/21/2017 0806   AST 20 08/10/2019 1906    AST 26 07/09/2019 0932   AST 37 (H) 01/21/2017 0806   ALT 11 08/10/2019 1906   ALT 11 07/09/2019 0932   ALT 25 10/12/2017 1013   ALT 16 01/21/2017 0806   BILITOT 1.1 08/10/2019 1906   BILITOT 1.1 07/09/2019 0932   BILITOT 0.89 01/21/2017 0806       Impression and Plan: Ms. Vore is a very pleasant 78 yo caucasian female with polycythemia vera, JAK-2 positive.   Life will be a whole lot easier now that she has a Port-A-Cath in place.  I will not change her Jakafi dose.  We will get her back in 6 weeks.  We will flush the Port-A-Cath when we see her back.  I know that she will will have a nice Hanukkah.  Again it will be very quiet because of the coronavirus.  Volanda Napoleon, MD 12/3/20202:34 PM

## 2019-09-21 ENCOUNTER — Encounter: Payer: Self-pay | Admitting: Gastroenterology

## 2019-09-21 ENCOUNTER — Ambulatory Visit (INDEPENDENT_AMBULATORY_CARE_PROVIDER_SITE_OTHER): Payer: PPO | Admitting: Gastroenterology

## 2019-09-21 ENCOUNTER — Other Ambulatory Visit: Payer: Self-pay

## 2019-09-21 VITALS — BP 148/76 | HR 96 | Temp 98.3°F | Ht 63.0 in

## 2019-09-21 DIAGNOSIS — K7460 Unspecified cirrhosis of liver: Secondary | ICD-10-CM | POA: Diagnosis not present

## 2019-09-21 LAB — LACTATE DEHYDROGENASE: LDH: 442 U/L — ABNORMAL HIGH (ref 98–192)

## 2019-09-21 MED ORDER — POTASSIUM CHLORIDE CRYS ER 20 MEQ PO TBCR: 20.0000 meq | EXTENDED_RELEASE_TABLET | Freq: Every day | ORAL | 3 refills | Status: DC

## 2019-09-21 NOTE — Patient Instructions (Signed)
We have sent the following medications to your pharmacy for you to pick up at your convenience: Klor-Con 20 meq  Stop your Klor-Con 10 meq per Dr [name].   Follow up with Dr Owens Loffler in 3 months.   I appreciate the opportunity to care for you. Owens Loffler, MD

## 2019-09-21 NOTE — Progress Notes (Signed)
Review of pertinent gastrointestinal problems: 1. 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  04/2019 [place] cirrhosis without focal lesions  AFP 08/2014 normal... 01/2018 4.7 (normal)... 04/2019 3.5 (normal)  EGD 02/2014 no portal hypertensive changes; + irregular z line, biopsies showed no IM:EGD 05/2018, Dr. Lyndel Safe while hospitalized, gastritis, mild duodenitis. No portal hypertensive signs.  Immunized for hep a/b 2015  Ascites managedwith relatively  aldactone, lasix; Fluid testing 05/2018 SAAG elevated, no SBP  MELD score 13 (04/2019 labs) 2. C. Diff colitis:diarrhea following abx for buttocks abscess 2015; C. Diff PCR +, C. Diff toxin A/B positive; treated with flagyl, vanc   HPI: This is a very pleasant 78 year old woman whom I last saw about 3 months ago here in the office.  She is here with her husband today.   I last saw her here in the office about 3 months ago.  She was a bit more bothered by lower extremity edema and I was actually not sure of her diuretic dosings and so her husband was going to call back with those doses.  She had a brief hospitalization in the end of October for a 2 night stay seems to be related to dehydration, creatinine baseline went from 1.6-3.0.  While she was in the hospital she underwent an ultrasound of her kidneys and that looked fine.  Her losartan and spironolactone were discontinued at the time of her discharge.  Blood work yesterday showed white count 14,000, hemoglobin 11.1, RDW 18.6, platelets 152; potassium 3.2 creatinine 1.3, total bilirubin 1.3  She tells me she is feeling overall  pretty well.  No overt encephalopathy.  Bowels move well.  No overt GI bleeding.  Her lower extremity edema is improving in the past several weeks.  Chief complaint is decompensated cirrhosis  ROS: complete GI ROS as described in HPI, all other review negative.  Constitutional:  No unintentional weight loss   Past Medical History:  Diagnosis Date  . Allergic rhinitis 01/23/2016  . C. difficile colitis   . CAD (coronary artery disease)    a. s/p STEMI in 01/2015 with 95% LCx stenosis and distal 80% LCx stenosis (DESx2 placed)  . Cancer ([place])    SKIN  . Candidiasis of skin 09/30/2014  . Cirrhosis ([place])   . Depression   . Diabetes mellitus 2008  . Gout   . Herpes simplex   . Hyperglycemia 05/31/2013  . Hyperlipidemia   . Hypertension   . Myocardial infarction ([place])   . 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 Obesity   . Polycythemia    Dr. Elease Hashimoto- [facility]  . Psoriasis     Past Surgical History:  Procedure Laterality Date  . ANKLE FRACTURE SURGERY    . BIOPSY  05/26/2018   Procedure: BIOPSY;  Surgeon: [name], MD;  Location: [facility] ENDOSCOPY;  Service: Endoscopy;;  . BREAST SURGERY Left    milk duct  . CATARACT EXTRACTION, BILATERAL    . COLONOSCOPY    . ESOPHAGOGASTRODUODENOSCOPY N/A 05/26/2018   Procedure: ESOPHAGOGASTRODUODENOSCOPY (EGD);  Surgeon: [name], MD;  Location: [facility] ENDOSCOPY;  Service: Endoscopy;  Laterality: N/A;  . IR IMAGING GUIDED PORT INSERTION  08/30/2019  . IR  PARACENTESIS  05/25/2018  . LEFT HEART CATHETERIZATION WITH CORONARY ANGIOGRAM N/A 01/27/2015   Procedure: LEFT HEART CATHETERIZATION WITH CORONARY ANGIOGRAM;  Surgeon: [name], MD;  Location: [facility] CATH LAB;  Service: Cardiovascular;  Laterality: N/A;  . TONSILLECTOMY    . TOTAL ABDOMINAL HYSTERECTOMY W/ BILATERAL SALPINGOOPHORECTOMY     for heavy periods with appendectomy    Current Outpatient Medications  Medication Sig Dispense Refill  . acetaminophen (TYLENOL) 325 MG  tablet Take 2 tablets (650 mg total) by mouth every 6 (six) hours as needed for mild pain (or Fever >/= 101).    Marland Kitchen amLODipine (NORVASC) 5 MG tablet Take 1 tablet (5 mg total) by mouth daily. 90 tablet 3  . Blood Glucose Monitoring Suppl (ACCU-CHEK GUIDE) w/Device KIT 1 kit by Does not apply route as directed. 1 kit 0  . clopidogrel (PLAVIX) 75 MG tablet TAKE 1 TABLET(75 MG) BY MOUTH DAILY (Patient taking differently: Take 75 mg by mouth daily. ) 90 tablet 3  . diazepam (VALIUM) 5 MG tablet Valium 5 mg by mouth at bedtime as needed. 3 tablet 0  . ezetimibe (ZETIA) 10 MG tablet TAKE 1 TABLET(10 MG) BY MOUTH DAILY (Patient taking differently: Take 10 mg by mouth daily. ) 90 tablet 0  . furosemide (LASIX) 40 MG tablet Take 40 mg by mouth daily. Take along with 20 mg tablet    . glucose blood (ACCU-CHEK GUIDE) test strip Use to check blood sugars 3 times daily 100 each 12  . insulin NPH Human (NOVOLIN N) 100 UNIT/ML injection Inject into the skin.    . Insulin Pen Needle (BD PEN NEEDLE NANO U/F) 32G X 4 MM MISC Use to inject insulin 2 times a day 200 each 11  . JAKAFI 25 MG tablet TAKE 1 TABLET (25 MG TOTAL) BY MOUTH 2 TIMES DAILY. 60 tablet 6  . latanoprost (XALATAN) 0.005 % ophthalmic solution Place 1 drop into both eyes at bedtime.     . lidocaine-prilocaine (EMLA) cream Apply 1 application topically as needed. Please apply EMLA over the Port-A-Cath site 1 hour prior to coming to our office. 30 g 5  . mirtazapine (REMERON) 15 MG tablet Take 1 tablet (15 mg total) by mouth at bedtime. 90 tablet 0  . ONETOUCH VERIO test strip USE TO TEST BLOOD SUGAR THREE TIMES DAILY 300 each 2  . oxyCODONE-acetaminophen (PERCOCET/ROXICET) 5-325 MG tablet Take 1-2 tablets by mouth every 8 (eight) hours as needed for severe pain. 15 tablet 0  . potassium chloride (KLOR-CON 10) 10 MEQ tablet Take 1 tablet (10 mEq total) by mouth daily. 30 tablet 6  . promethazine (PHENERGAN) 25 MG tablet Take 0.5 tablets (12.5 mg total)  by mouth every 6 (six) hours as needed for nausea or vomiting. 30 tablet 1   No current facility-administered medications for this visit.     Allergies as of 09/21/2019 - Review Complete 09/21/2019  Allergen Reaction Noted  . Penicillins Swelling 05/23/2018  . Lisinopril Cough 04/08/2015  . Tape Hives 01/28/2014  . Doxycycline Hives, Swelling, and Rash   . Latex Hives, Itching, and Rash 09/07/2011    Family History  Problem Relation Age of Onset  . Diabetes Mother   . Heart disease Mother        CAD  . Hyperlipidemia Mother   . Hypertension Mother   . Kidney disease Mother   . Kidney disease Father   . Breast cancer Maternal Aunt   . Heart disease Maternal Grandmother   .  Heart disease Other        maternal aunts and uncles  . Colon cancer Neg Hx   . Esophageal cancer Neg Hx     Social History   Socioeconomic History  . Marital status: Married    Spouse name: [name]  . Number of children: 2  . Years of education: 2 yr colle  . Highest education level: Not on file  Occupational History  . Occupation: housewife  Social Needs  . Financial resource strain: Not on file  . Food insecurity    Worry: Not on file    Inability: Not on file  . Transportation needs    Medical: Not on file    Non-medical: Not on file  Tobacco Use  . Smoking status: Former Smoker    Years: 48.00    Types: Cigarettes    Quit date: 10/18/1986    Years since quitting: 32.9  . Smokeless tobacco: Never Used  Substance and Sexual Activity  . Alcohol use: No    Alcohol/week: 0.0 standard drinks  . Drug use: No  . Sexual activity: Yes    Partners: Male    Birth control/protection: None  Lifestyle  . Physical activity    Days per week: Not on file    Minutes per session: Not on file  . Stress: Not on file  Relationships  . Social Herbalist on phone: Not on file    Gets together: Not on file    Attends religious service: Not on file    Active member of club or organization: Not  on file    Attends meetings of clubs or organizations: Not on file    Relationship status: Not on file  . Intimate partner violence    Fear of current or ex partner: Not on file    Emotionally abused: Not on file    Physically abused: Not on file    Forced sexual activity: Not on file  Other Topics Concern  . Not on file  Social History Narrative   2 caffeine drink per day.  No regular exercise.     Retired from the bank   2 children (Daughter and a son) son has morbid obesity   2 grandchildren   3 great grandchildren     Physical Exam: BP (!) 148/76 (BP Location: Left Arm, Patient Position: Sitting, Cuff Size: Normal)   Pulse 96   Temp 98.3 F (36.8 C)   Ht 5' 3" (1.6 m)   BMI 35.61 kg/m  Constitutional: Chronically ill-appearing, sitting in a wheelchair Psychiatric: alert and oriented x3 Abdomen: soft, nontender, nondistended, no obvious ascites, no peritoneal signs, normal bowel sounds 1+ pitting edema bilaterally in her ankles  Assessment and plan: 78 y.o. female with decompensated cirrhosis  Decompensated cirrhosis.  This is complicated by cardiac disease as well.  Her kidneys have proved to be quite sensitive when she went into acute renal insufficiency 2 months ago with creatinine up to 3-1/2 or so.  Her baseline is less than 1-1/2.  She understands that this means we will have to err on the side of caution and not try to shoot for no lower extremity edema with increasing her diuretics.  Monotherapy Lasix seems to be a bit smoother for her right now.  She is currently taking 60 of Lasix daily.  Her potassium was checked yesterday by her oncologist who sees her for polycythemia.  3.2.  She takes 10 mEq of potassium daily and I am going  to have her double that starting today so that she will be taking 20 mEq of potassium every day.  I am making otherwise no other adjustments to her medicines and she will return to see me in 3 months and sooner if needed.  Please see the  "Patient Instructions" section for addition details about the plan.  Owens Loffler, MD [facility] 09/21/2019, 9:23 AM

## 2019-09-27 ENCOUNTER — Other Ambulatory Visit: Payer: Self-pay | Admitting: Adult Health

## 2019-09-27 DIAGNOSIS — M25561 Pain in right knee: Secondary | ICD-10-CM

## 2019-09-27 NOTE — Telephone Encounter (Signed)
Medication Refill - Medication: oxyCODONE-acetaminophen (PERCOCET/ROXICET) 5-325 MG tablet [[id number]    Preferred Pharmacy (with phone number or street name): [facility] [address], [address] [place]  [place] [address]  Phone: [phone] Fax: [phone]      Agent: Please be advised that RX refills may take up to 3 business days. We ask that you follow-up with your pharmacy.

## 2019-09-27 NOTE — Telephone Encounter (Signed)
[name] called and refill for Oxycodone requested to be filled as soon as possible.

## 2019-09-27 NOTE — Telephone Encounter (Signed)
Requested medication (s) are due for refill today: yes   Requested medication (s) are on the active medication list: yes  Last refill: 02/02/2019  Future visit scheduled: no  Notes to clinic:  not delegated   Requested Prescriptions  Pending Prescriptions Disp Refills   oxyCODONE-acetaminophen (PERCOCET/ROXICET) 5-325 MG tablet 15 tablet 0    Sig: Take 1-2 tablets by mouth every 8 (eight) hours as needed for severe pain.      Not Delegated - Analgesics:  Opioid Agonist Combinations Failed - 09/27/2019  1:33 PM      Failed - This refill cannot be delegated      Failed - Urine Drug Screen completed in last 360 days.      Passed - Valid encounter within last 6 months    Recent Outpatient Visits           1 month ago Failure to thrive in adult   Therapist, music at [facility], [place], NP   2 months ago Medication Retail buyer at [facility], [place], NP   7 months ago Chronic pain of right knee   Therapist, music at [facility], [place], NP   9 months ago Gastroesophageal reflux disease with esophagitis   Therapist, music at [facility], [place], NP   10 months ago [employer] intolerance   Therapist, music at [facility], [place], NP

## 2019-09-28 MED ORDER — OXYCODONE-ACETAMINOPHEN 5-325 MG PO TABS: 1.0000 | ORAL_TABLET | Freq: Three times a day (TID) | ORAL | 0 refills | Status: DC | PRN

## 2019-09-28 NOTE — Telephone Encounter (Signed)
Pts husband called again and stated he checked with the pharmacy for the refill for oxyCODONE-acetaminophen (PERCOCET/ROXICET) 5-325 MG tablet Advised Pts husband request was sent to provider and the time for refill request / Please advise

## 2019-10-04 ENCOUNTER — Telehealth: Payer: Self-pay | Admitting: *Deleted

## 2019-10-04 MED FILL — JAKAFI 25 MG TABLET: 25 | 30 days supply | Qty: 60 | Fill #1

## 2019-10-04 NOTE — Telephone Encounter (Signed)
Left a message for a return call.  I have not received a form that needs to be signed.  Will need to be re-sent.

## 2019-10-04 NOTE — Telephone Encounter (Signed)
Copied from [place] [phone]. Topic: General - Other >> Oct 04, 2019 11:43 AM Antonieta Iba C wrote: Reason for CRM: Mariann Laster with [name] is calling in to follow up on order that was faxed over for  08/30/19, they still have not received it back signed by PCP, this is out of medicare compliance, they would like to receive back as soon as possible.  CB: [phone] Fax: [phone]

## 2019-10-04 NOTE — Telephone Encounter (Signed)
Message received from patient's husband requesting a call back.  Call placed back to patient's husband and he wants to know phlebotomists name in lab to send her a card of appreciation.

## 2019-10-05 NOTE — Telephone Encounter (Signed)
Will then fax.  Nothing further needed.

## 2019-10-05 NOTE — Telephone Encounter (Signed)
Form received and awaiting signature from [place].

## 2019-10-05 NOTE — Telephone Encounter (Signed)
Spoke to [place] and informed her that I have not received any forms.  She will re-fax.  Will wait to receive.

## 2019-10-09 ENCOUNTER — Telehealth: Payer: Self-pay | Admitting: Gastroenterology

## 2019-10-09 DIAGNOSIS — K7460 Unspecified cirrhosis of liver: Secondary | ICD-10-CM

## 2019-10-09 NOTE — Telephone Encounter (Signed)
Left message for patient to call back  

## 2019-10-09 NOTE — Telephone Encounter (Signed)
Patient's husband called stating that she has been having severe diarrhea since she started Potassium

## 2019-10-09 NOTE — Telephone Encounter (Signed)
Called and spoke with patient's husband-Amanda [name]-verified DPR-Amanda [name] "Amanda [name]"- reports that the patient is experiencing "horrible diarrhea since going up on that big potassium pill" -Amanda [name] is requesting to have the smaller dosage (smaller pill) "maybe taking it more frequently"-the patient has been having constant diarrhea since increasing the dosage of the potassium -patient denies "per Amanda [name]"- nausea/vomiting/fever/other symptoms besides horrible diarrhea, denies eating anything that would cause diarrhea - please advise

## 2019-10-10 DIAGNOSIS — R531 Weakness: Secondary | ICD-10-CM | POA: Diagnosis not present

## 2019-10-10 DIAGNOSIS — S8291XA Unspecified fracture of right lower leg, initial encounter for closed fracture: Secondary | ICD-10-CM | POA: Diagnosis not present

## 2019-10-10 MED ORDER — POTASSIUM CHLORIDE CRYS ER 10 MEQ PO TBCR: 10.0000 meq | EXTENDED_RELEASE_TABLET | Freq: Two times a day (BID) | ORAL | 6 refills | Status: DC

## 2019-10-10 NOTE — Telephone Encounter (Signed)
Certainly OK to change back to her previous 25mq potassium pill but take it twice daily instead of once daily.  Can you please offer new scripts for that.  Advise her to call in 4-5 days to report on how she responded to this change.  thanks

## 2019-10-10 NOTE — Telephone Encounter (Signed)
Called and spoke with patient's husband-Amanda [name]-verififed DPR-Amanda [name] informed of MD recommendations; Amanda [name] is agreeable with plan of care and verified pharmacy; RX sent to pharmacy; Amanda [name] verbalized understanding of information/instructions;  Amanda [name] was advised to call the office at [phone] if questions/concerns arise and to have the patient call the office in 4-5 days for a symptom update;

## 2019-10-16 ENCOUNTER — Other Ambulatory Visit: Payer: Self-pay | Admitting: Cardiovascular Disease

## 2019-10-24 DIAGNOSIS — L602 Onychogryphosis: Secondary | ICD-10-CM | POA: Diagnosis not present

## 2019-10-24 DIAGNOSIS — I739 Peripheral vascular disease, unspecified: Secondary | ICD-10-CM | POA: Diagnosis not present

## 2019-10-24 DIAGNOSIS — E1351 Other specified diabetes mellitus with diabetic peripheral angiopathy without gangrene: Secondary | ICD-10-CM | POA: Diagnosis not present

## 2019-11-01 ENCOUNTER — Encounter: Payer: Self-pay | Admitting: Hematology & Oncology

## 2019-11-01 ENCOUNTER — Inpatient Hospital Stay: Payer: Medicare HMO | Attending: Family

## 2019-11-01 ENCOUNTER — Other Ambulatory Visit: Payer: Self-pay

## 2019-11-01 ENCOUNTER — Inpatient Hospital Stay: Payer: Medicare HMO

## 2019-11-01 ENCOUNTER — Inpatient Hospital Stay (HOSPITAL_BASED_OUTPATIENT_CLINIC_OR_DEPARTMENT_OTHER): Payer: Medicare HMO | Admitting: Hematology & Oncology

## 2019-11-01 VITALS — BP 153/71 | HR 85 | Temp 98.7°F | Resp 20 | Wt 191.0 lb

## 2019-11-01 DIAGNOSIS — D45 Polycythemia vera: Secondary | ICD-10-CM | POA: Insufficient documentation

## 2019-11-01 DIAGNOSIS — Z95828 Presence of other vascular implants and grafts: Secondary | ICD-10-CM

## 2019-11-01 DIAGNOSIS — I2510 Atherosclerotic heart disease of native coronary artery without angina pectoris: Secondary | ICD-10-CM

## 2019-11-01 LAB — CBC WITH DIFFERENTIAL (CANCER CENTER ONLY)
Abs Immature Granulocytes: 1.13 10*3/uL — ABNORMAL HIGH (ref 0.00–0.07)
Basophils Absolute: 0.2 10*3/uL — ABNORMAL HIGH (ref 0.0–0.1)
Basophils Relative: 1 %
Eosinophils Absolute: 0 10*3/uL (ref 0.0–0.5)
Eosinophils Relative: 0 %
HCT: 34.1 % — ABNORMAL LOW (ref 36.0–46.0)
Hemoglobin: 11.5 g/dL — ABNORMAL LOW (ref 12.0–15.0)
Immature Granulocytes: 8 %
Lymphocytes Relative: 13 %
Lymphs Abs: 1.9 10*3/uL (ref 0.7–4.0)
MCH: 30.9 pg (ref 26.0–34.0)
MCHC: 33.7 g/dL (ref 30.0–36.0)
MCV: 91.7 fL (ref 80.0–100.0)
Monocytes Absolute: 1.1 10*3/uL — ABNORMAL HIGH (ref 0.1–1.0)
Monocytes Relative: 7 %
Neutro Abs: 10.5 10*3/uL — ABNORMAL HIGH (ref 1.7–7.7)
Neutrophils Relative %: 71 %
Platelet Count: 206 10*3/uL (ref 150–400)
RBC: 3.72 MIL/uL — ABNORMAL LOW (ref 3.87–5.11)
RDW: 17.8 % — ABNORMAL HIGH (ref 11.5–15.5)
WBC Count: 14.8 10*3/uL — ABNORMAL HIGH (ref 4.0–10.5)
nRBC: 0.9 % — ABNORMAL HIGH (ref 0.0–0.2)

## 2019-11-01 LAB — CMP (CANCER CENTER ONLY)
ALT: 9 U/L (ref 0–44)
AST: 33 U/L (ref 15–41)
Albumin: 4 g/dL (ref 3.5–5.0)
Alkaline Phosphatase: 78 U/L (ref 38–126)
Anion gap: 9 (ref 5–15)
BUN: 9 mg/dL (ref 8–23)
CO2: 28 mmol/L (ref 22–32)
Calcium: 9.5 mg/dL (ref 8.9–10.3)
Chloride: 104 mmol/L (ref 98–111)
Creatinine: 1.06 mg/dL — ABNORMAL HIGH (ref 0.44–1.00)
GFR, Est AFR Am: 57 mL/min — ABNORMAL LOW (ref >60)
GFR, Estimated: 50 mL/min — ABNORMAL LOW (ref >60)
Glucose, Bld: 156 mg/dL — ABNORMAL HIGH (ref 70–99)
Potassium: 3.1 mmol/L — ABNORMAL LOW (ref 3.5–5.1)
Sodium: 141 mmol/L (ref 135–145)
Total Bilirubin: 1.3 mg/dL — ABNORMAL HIGH (ref 0.3–1.2)
Total Protein: 6.2 g/dL — ABNORMAL LOW (ref 6.5–8.1)

## 2019-11-01 LAB — RETICULOCYTES
Immature Retic Fract: 30.1 % — ABNORMAL HIGH (ref 2.3–15.9)
RBC.: 3.68 MIL/uL — ABNORMAL LOW (ref 3.87–5.11)
Retic Count, Absolute: 93.8 10*3/uL (ref 19.0–186.0)
Retic Ct Pct: 2.6 % (ref 0.4–3.1)

## 2019-11-01 LAB — SAMPLE TO BLOOD BANK

## 2019-11-01 MED ORDER — SODIUM CHLORIDE 0.9% FLUSH
10.0000 mL | INTRAVENOUS | Status: DC | PRN
  Administered 2019-11-01: 15:00:00 10 mL via INTRAVENOUS
  Filled 2019-11-01: qty 10

## 2019-11-01 MED ORDER — HEPARIN SOD (PORK) LOCK FLUSH 100 UNIT/ML IV SOLN
500.0000 [IU] | Freq: Once | INTRAVENOUS | Status: AC
  Administered 2019-11-01: 15:00:00 500 [IU] via INTRAVENOUS
  Filled 2019-11-01: qty 5

## 2019-11-01 NOTE — Progress Notes (Signed)
Hematology and Oncology Follow Up Visit  Amanda [name] [id number] 07-Aug-1941 79 y.o. 11/01/2019   Principle Diagnosis:  [name] - JAK2 (+)  Current Therapy:   Jakafi 25 mg po q day   Interim History:  Amanda [name] is here today for follow-up.  She really looks quite good.  She says that she really feels good.  Her only problem that she was not walking well.  I think that she probably is going need to have some kind of rehabilitation.  She got through the holidays without any difficulty.  She has been very cautious with the coronavirus.  She really has not been going out anywhere.  She is doing well on the [place].  She, surprisingly, has never had a problem taking the Jakafi at the full dose.  She has had no fever.  There is no cough.  She has had no bleeding.  There is been no diarrhea.  Her blood sugars have been doing a lot better since she has started the Trulicity.  She is watching what she eats.  She is trying to be very proactive with respect to her diabetes control.  Overall, I would say performance status is ECOG 1-2.    Medications:  Allergies as of 11/01/2019      Reactions   Penicillins Swelling   Has patient had a PCN reaction causing immediate rash, facial/tongue/throat swelling, SOB or lightheadedness with hypotension: No Has patient had a PCN reaction causing severe rash involving mucus membranes or skin necrosis: No Has patient had a PCN reaction that required hospitalization: No Has patient had a PCN reaction occurring within the last 10 years: No If all of the above answers are "NO", then may proceed with Cephalosporin use.   Lisinopril Cough   Tape Hives   Doxycycline Hives, Swelling, Rash   Latex Hives, Itching, Rash      Medication List       Accurate as of November 01, 2019  3:14 PM. If you have any questions, ask your nurse or doctor.        Accu-Chek Guide w/Device Kit 1 kit by Does not apply route as directed.   acetaminophen 325 MG  tablet Commonly known as: TYLENOL Take 2 tablets (650 mg total) by mouth every 6 (six) hours as needed for mild pain (or Fever >/= 101).   amLODipine 5 MG tablet Commonly known as: NORVASC Take 1 tablet (5 mg total) by mouth daily.   BD Pen Needle Nano U/F 32G X 4 MM Misc Generic drug: Insulin Pen Needle Use to inject insulin 2 times a day What changed:   how much to take  when to take this  additional instructions   clopidogrel 75 MG tablet Commonly known as: PLAVIX TAKE 1 TABLET(75 MG) BY MOUTH DAILY What changed: See the new instructions.   diazepam 5 MG tablet Commonly known as: VALIUM Valium 5 mg by mouth at bedtime as needed.   ezetimibe 10 MG tablet Commonly known as: ZETIA TAKE 1 TABLET(10 MG) BY MOUTH DAILY What changed: See the new instructions.   furosemide 40 MG tablet Commonly known as: LASIX TAKE 1 TABLET(40 MG) BY MOUTH DAILY   glucose blood test strip Commonly known as: Accu-Chek Guide Use to check blood sugars 3 times daily   OneTouch Verio test strip Generic drug: glucose blood USE TO TEST BLOOD SUGAR THREE TIMES DAILY   insulin NPH Human 100 UNIT/ML injection Commonly known as: NOVOLIN N Inject into the skin.  Jakafi 25 MG tablet Generic drug: ruxolitinib phosphate TAKE 1 TABLET (25 MG TOTAL) BY MOUTH 2 TIMES DAILY.   latanoprost 0.005 % ophthalmic solution Commonly known as: XALATAN Place 1 drop into both eyes at bedtime.   lidocaine-prilocaine cream Commonly known as: EMLA Apply 1 application topically as needed. Please apply EMLA over the Port-A-Cath site 1 hour prior to coming to our office.   mirtazapine 15 MG tablet Commonly known as: REMERON Take 1 tablet (15 mg total) by mouth at bedtime.   oxyCODONE-acetaminophen 5-325 MG tablet Commonly known as: PERCOCET/ROXICET Take 1-2 tablets by mouth every 8 (eight) hours as needed for severe pain.   potassium chloride 10 MEQ tablet Commonly known as: [place]-Con M10 Take 1 tablet  (10 mEq total) by mouth 2 (two) times daily. What changed: when to take this   promethazine 25 MG tablet Commonly known as: PHENERGAN Take 0.5 tablets (12.5 mg total) by mouth every 6 (six) hours as needed for nausea or vomiting.   timolol 0.5 % ophthalmic solution Commonly known as: TIMOPTIC 1 drop daily.       Allergies:  Allergies  Allergen Reactions  . Penicillins Swelling    Has patient had a PCN reaction causing immediate rash, facial/tongue/throat swelling, SOB or lightheadedness with hypotension: No Has patient had a PCN reaction causing severe rash involving mucus membranes or skin necrosis: No Has patient had a PCN reaction that required hospitalization: No Has patient had a PCN reaction occurring within the last 10 years: No If all of the above answers are "NO", then may proceed with Cephalosporin use.  Marland Kitchen Lisinopril Cough  . Tape Hives  . Doxycycline Hives, Swelling and Rash  . Latex Hives, Itching and Rash    Past Medical History, Surgical history, Social history, and Family History were reviewed and updated.  Review of Systems: Review of Systems  Constitutional: Negative.   HENT: Negative.   Eyes: Negative.   Respiratory: Negative.   Cardiovascular: Negative.   Gastrointestinal: Negative.   Genitourinary: Negative.   Musculoskeletal: Negative.   Skin: Negative.   Neurological: Negative.   Endo/Heme/Allergies: Negative.   Psychiatric/Behavioral: Negative.    Marland Kitchen   Physical Exam:  weight is 191 lb (86.6 kg). Her temporal temperature is 98.7 F (37.1 C). Her blood pressure is 153/71 (abnormal) and her pulse is 85. Her respiration is 20 and oxygen saturation is 95%.   Wt Readings from Last 3 Encounters:  11/01/19 191 lb (86.6 kg)  09/19/19 201 lb (91.2 kg)  08/21/19 211 lb (95.7 kg)    Physical Exam Vitals reviewed.  HENT:     Head: Normocephalic and atraumatic.  Eyes:     Pupils: Pupils are equal, round, and reactive to light.  Cardiovascular:       Rate and Rhythm: Normal rate and regular rhythm.     Heart sounds: Normal heart sounds.  Pulmonary:     Effort: Pulmonary effort is normal.     Breath sounds: Normal breath sounds.  Abdominal:     General: Bowel sounds are normal.     Palpations: Abdomen is soft.  Musculoskeletal:        General: No tenderness or deformity. Normal range of motion.     Cervical back: Normal range of motion.  Lymphadenopathy:     Cervical: No cervical adenopathy.  Skin:    General: Skin is warm and dry.     Findings: No erythema or rash.  Neurological:     Mental Status: She is alert  and oriented to person, place, and time.  Psychiatric:        Behavior: Behavior normal.        Thought Content: Thought content normal.        Judgment: Judgment normal.      Lab Results  Component Value Date   WBC 14.8 (H) 11/01/2019   HGB 11.5 (L) 11/01/2019   HCT 34.1 (L) 11/01/2019   MCV 91.7 11/01/2019   PLT 206 11/01/2019   Lab Results  Component Value Date   FERRITIN 219 07/09/2019   IRON 104 07/09/2019   TIBC 316 07/09/2019   UIBC 213 07/09/2019   IRONPCTSAT 33 07/09/2019   Lab Results  Component Value Date   RETICCTPCT 2.6 11/01/2019   RBC 3.72 (L) 11/01/2019   RBC 3.68 (L) 11/01/2019   No results found for: Nils Pyle [facility] Lab Results  Component Value Date   IGA 233 03/29/2014   No results found for: Odetta Pink, SPEI   Chemistry      Component Value Date/Time   NA 142 09/20/2019 1354   NA 140 10/12/2017 1013   NA 138 01/21/2017 0806   K 3.2 (L) 09/20/2019 1354   K 3.7 10/12/2017 1013   K 4.1 01/21/2017 0806   CL 106 09/20/2019 1354   CL 103 10/12/2017 1013   CL 103 02/16/2013 0900   CO2 28 09/20/2019 1354   CO2 25 10/12/2017 1013   CO2 21 (L) 01/21/2017 0806   BUN 11 09/20/2019 1354   BUN 10 10/12/2017 1013   BUN 12.2 01/21/2017 0806   CREATININE 1.31 (H) 09/20/2019 1354   CREATININE 0.9  10/12/2017 1013   CREATININE 1.0 01/21/2017 0806      Component Value Date/Time   CALCIUM 9.1 09/20/2019 1354   CALCIUM 8.7 10/12/2017 1013   CALCIUM 9.3 01/21/2017 0806   ALKPHOS 64 09/20/2019 1354   ALKPHOS 97 (H) 10/12/2017 1013   ALKPHOS 112 01/21/2017 0806   AST 35 09/20/2019 1354   AST 37 (H) 01/21/2017 0806   ALT 12 09/20/2019 1354   ALT 25 10/12/2017 1013   ALT 16 01/21/2017 0806   BILITOT 1.3 (H) 09/20/2019 1354   BILITOT 0.89 01/21/2017 0806       Impression and Plan: Ms. Prosser is a very pleasant 79 yo caucasian female with polycythemia vera, JAK-2 positive.   I am happy that the Port-A-Cath is working well.  I know this is going to make life a lot easier for her.  Unfortunately we do not have any chemistry studies back on her yet today so we can really see what her kidney function is and her blood sugars are.    I would like to try to get her back in a couple months now.  I think we can try to move her appointments out a little bit further and now that she is somewhat stable.    Volanda Napoleon, MD 1/14/20213:14 PM

## 2019-11-01 NOTE — Patient Instructions (Signed)

## 2019-11-02 LAB — IRON AND TIBC
Iron: 92 ug/dL (ref 41–142)
Saturation Ratios: 33 % (ref 21–57)
TIBC: 276 ug/dL (ref 236–444)
UIBC: 184 ug/dL (ref 120–384)

## 2019-11-02 LAB — FERRITIN: Ferritin: 160 ng/mL (ref 11–307)

## 2019-11-02 LAB — LACTATE DEHYDROGENASE: LDH: 400 U/L — ABNORMAL HIGH (ref 98–192)

## 2019-11-05 ENCOUNTER — Telehealth: Payer: Self-pay

## 2019-11-05 MED FILL — JAKAFI 25 MG TABLET: 25 | 30 days supply | Qty: 60 | Fill #2

## 2019-11-05 NOTE — Telephone Encounter (Signed)
Forms for [facility] medication assistance for Trulicity filled out, signed by Dr. Cruzita Lederer and faxed to [facility] with confirmation.

## 2019-11-06 ENCOUNTER — Other Ambulatory Visit: Payer: Self-pay | Admitting: Cardiovascular Disease

## 2019-11-11 ENCOUNTER — Ambulatory Visit: Payer: Medicare HMO | Attending: Internal Medicine

## 2019-11-11 DIAGNOSIS — Z23 Encounter for immunization: Secondary | ICD-10-CM | POA: Insufficient documentation

## 2019-11-11 NOTE — Progress Notes (Signed)
   Covid-19 Vaccination Clinic  Name:  DESSIREE SZE    MRN: [id number] DOB: 08/10/1941  11/11/2019  Ms. Crowell was observed post Covid-19 immunization for 15 minutes without incidence. She was provided with Vaccine Information Sheet and instruction to access the V-Safe system.   Ms. Yerby was instructed to call 911 with any severe reactions post vaccine: Marland Kitchen Difficulty breathing  . Swelling of your face and throat  . A fast heartbeat  . A bad rash all over your body  . Dizziness and weakness    Immunizations Administered    Name Date Dose VIS Date Route   Pfizer COVID-19 Vaccine 11/11/2019 11:48 AM 0.3 mL 09/28/2019 Intramuscular   Manufacturer: [place]   Lot: [id number]   [place]: [address]-0

## 2019-11-26 NOTE — Telephone Encounter (Signed)
Patients husband called today requesting [employer] application be faxed again because the financial info did not go through-Melissa has sent this again today

## 2019-11-27 ENCOUNTER — Other Ambulatory Visit: Payer: Self-pay | Admitting: Adult Health

## 2019-12-03 ENCOUNTER — Ambulatory Visit: Payer: Medicare HMO | Attending: Internal Medicine

## 2019-12-03 DIAGNOSIS — Z23 Encounter for immunization: Secondary | ICD-10-CM | POA: Insufficient documentation

## 2019-12-03 MED FILL — JAKAFI 25 MG TABLET: 25 | 30 days supply | Qty: 60 | Fill #3

## 2019-12-03 NOTE — Progress Notes (Signed)
   Covid-19 Vaccination Clinic  Name:  Amanda [name]    MRN: [id number] DOB: Sep 01, 1941  12/03/2019  [name] was observed post Covid-19 immunization for 15 minutes without incidence. She was provided with Vaccine Information Sheet and instruction to access the V-Safe system.   [name] was instructed to call 911 with any severe reactions post vaccine: Marland Kitchen Difficulty breathing  . Swelling of your face and throat  . A fast heartbeat  . A bad rash all over your body  . Dizziness and weakness    Immunizations Administered    Name Date Dose VIS Date Route   Pfizer COVID-19 Vaccine 12/03/2019 10:39 AM 0.3 mL 09/28/2019 Intramuscular   Manufacturer: [place]   Lot: [id number]   [place]: [id number]

## 2019-12-07 ENCOUNTER — Telehealth: Payer: Self-pay | Admitting: Adult Health

## 2019-12-07 ENCOUNTER — Telehealth: Payer: Self-pay

## 2019-12-07 NOTE — Telephone Encounter (Signed)
Rash under her breast. Scheduled an appt

## 2019-12-07 NOTE — Telephone Encounter (Signed)
Patient application for medication assistance through [facility] has been approved.

## 2019-12-11 ENCOUNTER — Other Ambulatory Visit: Payer: Self-pay

## 2019-12-11 ENCOUNTER — Telehealth (INDEPENDENT_AMBULATORY_CARE_PROVIDER_SITE_OTHER): Payer: Medicare HMO | Admitting: Adult Health

## 2019-12-11 ENCOUNTER — Encounter: Payer: Self-pay | Admitting: Adult Health

## 2019-12-11 DIAGNOSIS — B372 Candidiasis of skin and nail: Secondary | ICD-10-CM | POA: Diagnosis not present

## 2019-12-11 MED ORDER — TRIAMCINOLONE ACETONIDE 0.1 % EX CREA: 1.0000 "application " | TOPICAL_CREAM | Freq: Two times a day (BID) | CUTANEOUS | 0 refills | Status: AC

## 2019-12-11 MED ORDER — FLUCONAZOLE 150 MG PO TABS: ORAL_TABLET | ORAL | 0 refills | Status: DC

## 2019-12-11 NOTE — Progress Notes (Signed)
Virtual Visit via Telephone Note  I connected with Amanda [name] on 12/11/19 at  1:30 PM EST by telephone and verified that I am speaking with the correct person using two identifiers.   I discussed the limitations, risks, security and privacy concerns of performing an evaluation and management service by telephone and the availability of in person appointments. I also discussed with the patient that there may be a patient responsible charge related to this service. The patient expressed understanding and agreed to proceed.  Location patient: home Location provider: work or home office Participants present for the call: patient, provider Patient did not have a visit in the prior 7 days to address this/these issue(s).   History of Present Illness: 79 year old female who is being evaluated today for an acute issue.  Over the last week she has developed a red rash under both breasts.  She reports that the rash is burning and itchy and is "causing need to come out of my skin".  Her husband reports that he has been applying antifungal cream and does report improvement but that the rash continues to be pretty extensive.  Does "crusty" charge.  Area is moist.  She is not wearing a bra and trying to keep with the area under her breasts as dry as possible.   Observations/Objective: Patient sounds cheerful and well on the phone. I do not appreciate any SOB. Speech and thought processing are grossly intact. Patient reported vitals:  Assessment and Plan: 1. Candidal intertrigo - Continue to apply anti fungal but mix will small amount of kenalog cream and apply twice a day  - keep area clean and dry  - Follow up in 4-5 days if no improvement  - fluconazole (DIFLUCAN) 150 MG tablet; Take one tab weekly for two weeks  Dispense: 2 tablet; Refill: 0 - triamcinolone cream (KENALOG) 0.1 %; Apply 1 application topically 2 (two) times daily.  Dispense: 30 g; Refill: 0   Follow Up Instructions:  I did not  refer this patient for an OV in the next 24 hours for this/these issue(s).  I discussed the assessment and treatment plan with the patient. The patient was provided an opportunity to ask questions and all were answered. The patient agreed with the plan and demonstrated an understanding of the instructions.   The patient was advised to call back or seek an in-person evaluation if the symptoms worsen or if the condition fails to improve as anticipated.  I provided 15 minutes of non-face-to-face time during this encounter.   [name], NP

## 2019-12-14 ENCOUNTER — Other Ambulatory Visit: Payer: Self-pay

## 2019-12-14 ENCOUNTER — Ambulatory Visit (INDEPENDENT_AMBULATORY_CARE_PROVIDER_SITE_OTHER): Payer: Medicare HMO | Admitting: Sports Medicine

## 2019-12-14 ENCOUNTER — Ambulatory Visit: Payer: Medicare HMO

## 2019-12-14 DIAGNOSIS — Z87898 Personal history of other specified conditions: Secondary | ICD-10-CM

## 2019-12-14 MED ORDER — PREDNISONE 50 MG PO TABS: ORAL_TABLET | ORAL | 0 refills | Status: DC

## 2019-12-14 NOTE — Progress Notes (Signed)
    Procedures performed today:    None.  Independent interpretation of tests performed by another provider:   None.  Impression and Recommendations:    Worsening lower extremity weakness Lailanie is here for worsening lower extremity weakness over the past several months. She does not recall any recent trauma. She is not having any increased urinary incontinence, no fevers, chills. She is fairly weak with mild foot drop, right worse than left. Other than her lower extremity weakness her neurologic exam was normal including cranial nerves, speech, reflexes, as well as coordination and sensation. I think this is likely related to spinal stenosis. We are going to check some labs including a CBC, CMP, TSH, B12. I would like x-rays of her lumbar spine, 5 days of prednisone. Because of her progressive weakness I have recommended that we obtain an MRI, CT, or other cross-sectional imaging but she declines this, risks, benefits and alternatives explained. Also adding in nerve conduction and EMG, return to see me next week to see how things are going.    ___________________________________________ Gwen Her. Dianah Field, M.D., ABFM., CAQSM. Primary Care and [place] [employer] [facility]

## 2019-12-14 NOTE — Assessment & Plan Note (Signed)
Amanda [name] is here for worsening lower extremity weakness over the past several months. She does not recall any recent trauma. She is not having any increased urinary incontinence, no fevers, chills. She is fairly weak with mild foot drop, right worse than left. Other than her lower extremity weakness her neurologic exam was normal including cranial nerves, speech, reflexes, as well as coordination and sensation. I think this is likely related to spinal stenosis. We are going to check some labs including a CBC, CMP, TSH, B12. I would like x-rays of her lumbar spine, 5 days of prednisone. Because of her progressive weakness I have recommended that we obtain an MRI, CT, or other cross-sectional imaging but she declines this, risks, benefits and alternatives explained. Also adding in nerve conduction and EMG, return to see me next week to see how things are going.

## 2019-12-18 ENCOUNTER — Telehealth: Payer: Self-pay | Admitting: Adult Health

## 2019-12-18 ENCOUNTER — Telehealth (INDEPENDENT_AMBULATORY_CARE_PROVIDER_SITE_OTHER): Payer: Medicare HMO | Admitting: Adult Health

## 2019-12-18 ENCOUNTER — Other Ambulatory Visit: Payer: Self-pay

## 2019-12-18 DIAGNOSIS — R630 Anorexia: Secondary | ICD-10-CM | POA: Diagnosis not present

## 2019-12-18 NOTE — Telephone Encounter (Signed)
Pt husband [name] would like a call back from [place]. Pt had covid vaccine and is not eating due to not being able to taste.

## 2019-12-18 NOTE — Progress Notes (Signed)
Virtual Visit via Telephone Note  I connected with Amanda [name] on 12/18/19 at  3:30 PM EST by telephone and verified that I am speaking with the correct person using two identifiers.   I discussed the limitations, risks, security and privacy concerns of performing an evaluation and management service by telephone and the availability of in person appointments. I also discussed with the patient that there may be a patient responsible charge related to this service. The patient expressed understanding and agreed to proceed.  Location patient: home Location provider: work or home office Participants present for the call: patient, provider, husband Patient did not have a visit in the prior 7 days to address this/these issue(s).   History of Present Illness: 79 year old female who is being seen today for loss of appetite.  She and her husband reports that she received her second Covid vaccination on 12/03/2019 and since that time she has had loss of appetite and loss of taste.  She does report that her sense of taste has returned but her appetite continues to be decreased.  Her husband reports that she will have a couple sips of soup or couple bites of a sandwich and then she is done.  This has been an issue for the patient in the past and she was placed on Remeron- which she is currently taking.   She denies nausea but did have vomiting earlier today after she drank lemonade.  She is feeling much better currently   Observations/Objective: Patient sounds cheerful and well on the phone. I do not appreciate any SOB. Speech and thought processing are grossly intact. Patient reported vitals:  Assessment and Plan:   Follow Up Instructions:  1. Loss of appetite -This is not a new issue despite the patient believing that her symptoms started after her Covid 19 vaccination.  I will have her increase Remeron to 30 mg nightly and see if this helps stimulate her appetite.  She was advised to follow-up  in 2 weeks if no improvement  I did not refer this patient for an OV in the next 24 hours for this/these issue(s).  I discussed the assessment and treatment plan with the patient. The patient was provided an opportunity to ask questions and all were answered. The patient agreed with the plan and demonstrated an understanding of the instructions.   The patient was advised to call back or seek an in-person evaluation if the symptoms worsen or if the condition fails to improve as anticipated.  I provided 18 minutes of non-face-to-face time during this encounter.   Amanda Peng, NP

## 2019-12-18 NOTE — Telephone Encounter (Signed)
Spoke to [employer].  He reports Amanda [name] had her second Covid vaccine on 12/03/2019.  Today she reports that she cannot taste anything.  [name] is also concerned as she has had some memory loss.  Advised that an office visit is needed.  Scheduled for a telephone call with [facility] today.  Nothing further needed.

## 2019-12-20 ENCOUNTER — Telehealth: Payer: Self-pay | Admitting: Adult Health

## 2019-12-20 DIAGNOSIS — I639 Cerebral infarction, unspecified: Secondary | ICD-10-CM

## 2019-12-20 DIAGNOSIS — R2681 Unsteadiness on feet: Secondary | ICD-10-CM

## 2019-12-20 NOTE — Telephone Encounter (Signed)
Spoke to [employer].  He would like to get Daveigh into [facility] office at [place], [place] [address].  Please advise.

## 2019-12-20 NOTE — Telephone Encounter (Signed)
[facility] for PT d/t gait instability s/p CVA

## 2019-12-20 NOTE — Telephone Encounter (Signed)
Patient's husband [name] called and wants to discuss getting specific physical therapy for Amanda [name].

## 2019-12-20 NOTE — Telephone Encounter (Signed)
Referral placed.  [name] notified.   Nothing further needed.

## 2019-12-21 ENCOUNTER — Ambulatory Visit: Payer: Medicare HMO | Admitting: Sports Medicine

## 2019-12-27 MED FILL — JAKAFI 25 MG TABLET: 25 | 30 days supply | Qty: 60 | Fill #4

## 2019-12-28 ENCOUNTER — Inpatient Hospital Stay (HOSPITAL_COMMUNITY)
Admission: EM | Admit: 2019-12-28 | Discharge: 2020-01-02 | DRG: 385 | Disposition: A | Discharge status: Home / Self Care | Payer: Medicare HMO | Attending: Internal Medicine | Admitting: Internal Medicine

## 2019-12-28 ENCOUNTER — Emergency Department (HOSPITAL_COMMUNITY): Payer: Medicare HMO

## 2019-12-28 ENCOUNTER — Encounter (HOSPITAL_COMMUNITY): Payer: Self-pay | Admitting: Emergency Medicine

## 2019-12-28 ENCOUNTER — Telehealth: Payer: Self-pay | Admitting: *Deleted

## 2019-12-28 ENCOUNTER — Telehealth: Payer: Self-pay | Admitting: Adult Health

## 2019-12-28 DIAGNOSIS — R197 Diarrhea, unspecified: Secondary | ICD-10-CM | POA: Diagnosis not present

## 2019-12-28 DIAGNOSIS — Z9049 Acquired absence of other specified parts of digestive tract: Secondary | ICD-10-CM

## 2019-12-28 DIAGNOSIS — I81 Portal vein thrombosis: Secondary | ICD-10-CM | POA: Diagnosis not present

## 2019-12-28 DIAGNOSIS — Z993 Dependence on wheelchair: Secondary | ICD-10-CM

## 2019-12-28 DIAGNOSIS — N189 Chronic kidney disease, unspecified: Secondary | ICD-10-CM | POA: Diagnosis not present

## 2019-12-28 DIAGNOSIS — Z8349 Family history of other endocrine, nutritional and metabolic diseases: Secondary | ICD-10-CM

## 2019-12-28 DIAGNOSIS — Z20822 Contact with and (suspected) exposure to covid-19: Secondary | ICD-10-CM | POA: Diagnosis not present

## 2019-12-28 DIAGNOSIS — E1165 Type 2 diabetes mellitus with hyperglycemia: Secondary | ICD-10-CM | POA: Diagnosis not present

## 2019-12-28 DIAGNOSIS — R000 Tachycardia, unspecified: Secondary | ICD-10-CM | POA: Diagnosis not present

## 2019-12-28 DIAGNOSIS — F40240 Claustrophobia: Secondary | ICD-10-CM | POA: Diagnosis present

## 2019-12-28 DIAGNOSIS — E1142 Type 2 diabetes mellitus with diabetic polyneuropathy: Secondary | ICD-10-CM | POA: Diagnosis not present

## 2019-12-28 DIAGNOSIS — N183 Chronic kidney disease, stage 3 unspecified: Secondary | ICD-10-CM | POA: Diagnosis present

## 2019-12-28 DIAGNOSIS — E876 Hypokalemia: Secondary | ICD-10-CM | POA: Diagnosis present

## 2019-12-28 DIAGNOSIS — I862 Pelvic varices: Secondary | ICD-10-CM | POA: Diagnosis present

## 2019-12-28 DIAGNOSIS — R9389 Abnormal findings on diagnostic imaging of other specified body structures: Secondary | ICD-10-CM | POA: Diagnosis present

## 2019-12-28 DIAGNOSIS — R404 Transient alteration of awareness: Secondary | ICD-10-CM | POA: Diagnosis not present

## 2019-12-28 DIAGNOSIS — E860 Dehydration: Secondary | ICD-10-CM | POA: Diagnosis present

## 2019-12-28 DIAGNOSIS — E785 Hyperlipidemia, unspecified: Secondary | ICD-10-CM | POA: Diagnosis present

## 2019-12-28 DIAGNOSIS — Z955 Presence of coronary angioplasty implant and graft: Secondary | ICD-10-CM

## 2019-12-28 DIAGNOSIS — I82220 Acute embolism and thrombosis of inferior vena cava: Secondary | ICD-10-CM | POA: Diagnosis present

## 2019-12-28 DIAGNOSIS — Z743 Need for continuous supervision: Secondary | ICD-10-CM | POA: Diagnosis not present

## 2019-12-28 DIAGNOSIS — Z9842 Cataract extraction status, left eye: Secondary | ICD-10-CM

## 2019-12-28 DIAGNOSIS — I2510 Atherosclerotic heart disease of native coronary artery without angina pectoris: Secondary | ICD-10-CM | POA: Diagnosis not present

## 2019-12-28 DIAGNOSIS — K8020 Calculus of gallbladder without cholecystitis without obstruction: Secondary | ICD-10-CM | POA: Diagnosis present

## 2019-12-28 DIAGNOSIS — Z6835 Body mass index (BMI) 35.0-35.9, adult: Secondary | ICD-10-CM

## 2019-12-28 DIAGNOSIS — R6510 Systemic inflammatory response syndrome (SIRS) of non-infectious origin without acute organ dysfunction: Secondary | ICD-10-CM | POA: Diagnosis present

## 2019-12-28 DIAGNOSIS — I10 Essential (primary) hypertension: Secondary | ICD-10-CM | POA: Diagnosis present

## 2019-12-28 DIAGNOSIS — K7460 Unspecified cirrhosis of liver: Secondary | ICD-10-CM | POA: Diagnosis not present

## 2019-12-28 DIAGNOSIS — D72829 Elevated white blood cell count, unspecified: Secondary | ICD-10-CM | POA: Diagnosis not present

## 2019-12-28 DIAGNOSIS — R531 Weakness: Secondary | ICD-10-CM | POA: Diagnosis not present

## 2019-12-28 DIAGNOSIS — N200 Calculus of kidney: Secondary | ICD-10-CM | POA: Diagnosis present

## 2019-12-28 DIAGNOSIS — K745 Biliary cirrhosis, unspecified: Secondary | ICD-10-CM | POA: Diagnosis not present

## 2019-12-28 DIAGNOSIS — I493 Ventricular premature depolarization: Secondary | ICD-10-CM | POA: Diagnosis present

## 2019-12-28 DIAGNOSIS — R188 Other ascites: Secondary | ICD-10-CM | POA: Diagnosis present

## 2019-12-28 DIAGNOSIS — Z794 Long term (current) use of insulin: Secondary | ICD-10-CM | POA: Diagnosis not present

## 2019-12-28 DIAGNOSIS — Z841 Family history of disorders of kidney and ureter: Secondary | ICD-10-CM

## 2019-12-28 DIAGNOSIS — G9341 Metabolic encephalopathy: Secondary | ICD-10-CM | POA: Diagnosis not present

## 2019-12-28 DIAGNOSIS — I129 Hypertensive chronic kidney disease with stage 1 through stage 4 chronic kidney disease, or unspecified chronic kidney disease: Secondary | ICD-10-CM | POA: Diagnosis not present

## 2019-12-28 DIAGNOSIS — Z8249 Family history of ischemic heart disease and other diseases of the circulatory system: Secondary | ICD-10-CM

## 2019-12-28 DIAGNOSIS — R4182 Altered mental status, unspecified: Secondary | ICD-10-CM | POA: Diagnosis not present

## 2019-12-28 DIAGNOSIS — I252 Old myocardial infarction: Secondary | ICD-10-CM

## 2019-12-28 DIAGNOSIS — E119 Type 2 diabetes mellitus without complications: Secondary | ICD-10-CM | POA: Diagnosis not present

## 2019-12-28 DIAGNOSIS — E11319 Type 2 diabetes mellitus with unspecified diabetic retinopathy without macular edema: Secondary | ICD-10-CM | POA: Diagnosis present

## 2019-12-28 DIAGNOSIS — K7290 Hepatic failure, unspecified without coma: Secondary | ICD-10-CM | POA: Diagnosis present

## 2019-12-28 DIAGNOSIS — Z8619 Personal history of other infectious and parasitic diseases: Secondary | ICD-10-CM

## 2019-12-28 DIAGNOSIS — E8809 Other disorders of plasma-protein metabolism, not elsewhere classified: Secondary | ICD-10-CM | POA: Diagnosis present

## 2019-12-28 DIAGNOSIS — Z803 Family history of malignant neoplasm of breast: Secondary | ICD-10-CM

## 2019-12-28 DIAGNOSIS — Z9079 Acquired absence of other genital organ(s): Secondary | ICD-10-CM

## 2019-12-28 DIAGNOSIS — K7581 Nonalcoholic steatohepatitis (NASH): Secondary | ICD-10-CM | POA: Diagnosis not present

## 2019-12-28 DIAGNOSIS — Z90722 Acquired absence of ovaries, bilateral: Secondary | ICD-10-CM

## 2019-12-28 DIAGNOSIS — E1122 Type 2 diabetes mellitus with diabetic chronic kidney disease: Secondary | ICD-10-CM | POA: Diagnosis present

## 2019-12-28 DIAGNOSIS — R161 Splenomegaly, not elsewhere classified: Secondary | ICD-10-CM | POA: Diagnosis present

## 2019-12-28 DIAGNOSIS — R1310 Dysphagia, unspecified: Secondary | ICD-10-CM | POA: Diagnosis present

## 2019-12-28 DIAGNOSIS — M542 Cervicalgia: Secondary | ICD-10-CM | POA: Diagnosis not present

## 2019-12-28 DIAGNOSIS — G4733 Obstructive sleep apnea (adult) (pediatric): Secondary | ICD-10-CM | POA: Diagnosis present

## 2019-12-28 DIAGNOSIS — Z87891 Personal history of nicotine dependence: Secondary | ICD-10-CM

## 2019-12-28 DIAGNOSIS — Z95828 Presence of other vascular implants and grafts: Secondary | ICD-10-CM

## 2019-12-28 DIAGNOSIS — I130 Hypertensive heart and chronic kidney disease with heart failure and stage 1 through stage 4 chronic kidney disease, or unspecified chronic kidney disease: Secondary | ICD-10-CM | POA: Diagnosis present

## 2019-12-28 DIAGNOSIS — R0902 Hypoxemia: Secondary | ICD-10-CM | POA: Diagnosis not present

## 2019-12-28 DIAGNOSIS — M8580 Other specified disorders of bone density and structure, unspecified site: Secondary | ICD-10-CM | POA: Diagnosis present

## 2019-12-28 DIAGNOSIS — R918 Other nonspecific abnormal finding of lung field: Secondary | ICD-10-CM | POA: Diagnosis not present

## 2019-12-28 DIAGNOSIS — Z7902 Long term (current) use of antithrombotics/antiplatelets: Secondary | ICD-10-CM

## 2019-12-28 DIAGNOSIS — Z881 Allergy status to other antibiotic agents status: Secondary | ICD-10-CM

## 2019-12-28 DIAGNOSIS — K5100 Ulcerative (chronic) pancolitis without complications: Secondary | ICD-10-CM | POA: Diagnosis not present

## 2019-12-28 DIAGNOSIS — R112 Nausea with vomiting, unspecified: Secondary | ICD-10-CM | POA: Diagnosis not present

## 2019-12-28 DIAGNOSIS — I5032 Chronic diastolic (congestive) heart failure: Secondary | ICD-10-CM | POA: Diagnosis present

## 2019-12-28 DIAGNOSIS — J9601 Acute respiratory failure with hypoxia: Secondary | ICD-10-CM | POA: Diagnosis not present

## 2019-12-28 DIAGNOSIS — I82439 Acute embolism and thrombosis of unspecified popliteal vein: Secondary | ICD-10-CM | POA: Diagnosis not present

## 2019-12-28 DIAGNOSIS — I700 Atherosclerosis of aorta: Secondary | ICD-10-CM | POA: Diagnosis present

## 2019-12-28 DIAGNOSIS — K7689 Other specified diseases of liver: Secondary | ICD-10-CM | POA: Diagnosis not present

## 2019-12-28 DIAGNOSIS — Z7401 Bed confinement status: Secondary | ICD-10-CM

## 2019-12-28 DIAGNOSIS — Z888 Allergy status to other drugs, medicaments and biological substances status: Secondary | ICD-10-CM

## 2019-12-28 DIAGNOSIS — Z9089 Acquired absence of other organs: Secondary | ICD-10-CM

## 2019-12-28 DIAGNOSIS — J189 Pneumonia, unspecified organism: Secondary | ICD-10-CM | POA: Diagnosis not present

## 2019-12-28 DIAGNOSIS — F411 Generalized anxiety disorder: Secondary | ICD-10-CM | POA: Diagnosis present

## 2019-12-28 DIAGNOSIS — D45 Polycythemia vera: Secondary | ICD-10-CM | POA: Diagnosis present

## 2019-12-28 DIAGNOSIS — R1110 Vomiting, unspecified: Secondary | ICD-10-CM | POA: Diagnosis not present

## 2019-12-28 DIAGNOSIS — Z66 Do not resuscitate: Secondary | ICD-10-CM | POA: Diagnosis not present

## 2019-12-28 DIAGNOSIS — Z9841 Cataract extraction status, right eye: Secondary | ICD-10-CM

## 2019-12-28 DIAGNOSIS — R935 Abnormal findings on diagnostic imaging of other abdominal regions, including retroperitoneum: Secondary | ICD-10-CM | POA: Diagnosis not present

## 2019-12-28 DIAGNOSIS — R410 Disorientation, unspecified: Secondary | ICD-10-CM | POA: Diagnosis not present

## 2019-12-28 DIAGNOSIS — K821 Hydrops of gallbladder: Secondary | ICD-10-CM | POA: Diagnosis not present

## 2019-12-28 DIAGNOSIS — Z880 Allergy status to penicillin: Secondary | ICD-10-CM

## 2019-12-28 DIAGNOSIS — R821 Myoglobinuria: Secondary | ICD-10-CM | POA: Diagnosis not present

## 2019-12-28 DIAGNOSIS — K766 Portal hypertension: Secondary | ICD-10-CM | POA: Diagnosis present

## 2019-12-28 DIAGNOSIS — J9811 Atelectasis: Secondary | ICD-10-CM | POA: Diagnosis not present

## 2019-12-28 DIAGNOSIS — Z91048 Other nonmedicinal substance allergy status: Secondary | ICD-10-CM

## 2019-12-28 DIAGNOSIS — Z8673 Personal history of transient ischemic attack (TIA), and cerebral infarction without residual deficits: Secondary | ICD-10-CM

## 2019-12-28 DIAGNOSIS — Z833 Family history of diabetes mellitus: Secondary | ICD-10-CM

## 2019-12-28 DIAGNOSIS — E11649 Type 2 diabetes mellitus with hypoglycemia without coma: Secondary | ICD-10-CM | POA: Diagnosis present

## 2019-12-28 DIAGNOSIS — Z79891 Long term (current) use of opiate analgesic: Secondary | ICD-10-CM

## 2019-12-28 DIAGNOSIS — Z91040 Latex allergy status: Secondary | ICD-10-CM

## 2019-12-28 DIAGNOSIS — L729 Follicular cyst of the skin and subcutaneous tissue, unspecified: Secondary | ICD-10-CM | POA: Diagnosis present

## 2019-12-28 DIAGNOSIS — Z79899 Other long term (current) drug therapy: Secondary | ICD-10-CM

## 2019-12-28 LAB — CBC WITH DIFFERENTIAL/PLATELET
Abs Immature Granulocytes: 4 10*3/uL — ABNORMAL HIGH (ref 0.00–0.07)
Basophils Absolute: 0.4 10*3/uL — ABNORMAL HIGH (ref 0.0–0.1)
Basophils Relative: 1 %
Eosinophils Absolute: 0.1 10*3/uL (ref 0.0–0.5)
Eosinophils Relative: 0 %
HCT: 42.6 % (ref 36.0–46.0)
Hemoglobin: 13.7 g/dL (ref 12.0–15.0)
Immature Granulocytes: 9 %
Lymphocytes Relative: 7 %
Lymphs Abs: 2.9 10*3/uL (ref 0.7–4.0)
MCH: 29.7 pg (ref 26.0–34.0)
MCHC: 32.2 g/dL (ref 30.0–36.0)
MCV: 92.4 fL (ref 80.0–100.0)
Monocytes Absolute: 1.4 10*3/uL — ABNORMAL HIGH (ref 0.1–1.0)
Monocytes Relative: 3 %
Neutro Abs: 35.4 10*3/uL — ABNORMAL HIGH (ref 1.7–7.7)
Neutrophils Relative %: 80 %
Platelets: 266 10*3/uL (ref 150–400)
RBC: 4.61 MIL/uL (ref 3.87–5.11)
RDW: 20.4 % — ABNORMAL HIGH (ref 11.5–15.5)
WBC: 44.2 10*3/uL — ABNORMAL HIGH (ref 4.0–10.5)
nRBC: 0.4 % — ABNORMAL HIGH (ref 0.0–0.2)

## 2019-12-28 LAB — COMPREHENSIVE METABOLIC PANEL
ALT: 7 U/L (ref 0–44)
AST: 30 U/L (ref 15–41)
Albumin: 3.5 g/dL (ref 3.5–5.0)
Alkaline Phosphatase: 117 U/L (ref 38–126)
Anion gap: 14 (ref 5–15)
BUN: 9 mg/dL (ref 8–23)
CO2: 24 mmol/L (ref 22–32)
Calcium: 8.9 mg/dL (ref 8.9–10.3)
Chloride: 99 mmol/L (ref 98–111)
Creatinine, Ser: 1.18 mg/dL — ABNORMAL HIGH (ref 0.44–1.00)
GFR calc Af Amer: 50 mL/min — ABNORMAL LOW (ref >60)
GFR calc non Af Amer: 44 mL/min — ABNORMAL LOW (ref >60)
Glucose, Bld: 67 mg/dL — ABNORMAL LOW (ref 70–99)
Potassium: 2.8 mmol/L — ABNORMAL LOW (ref 3.5–5.1)
Sodium: 137 mmol/L (ref 135–145)
Total Bilirubin: 2 mg/dL — ABNORMAL HIGH (ref 0.3–1.2)
Total Protein: 6.6 g/dL (ref 6.5–8.1)

## 2019-12-28 LAB — AMMONIA: Ammonia: 56 umol/L — ABNORMAL HIGH (ref 9–35)

## 2019-12-28 LAB — RESPIRATORY PANEL BY RT PCR (FLU A&B, COVID)
Influenza A by PCR: NEGATIVE
Influenza B by PCR: NEGATIVE
SARS Coronavirus 2 by RT PCR: NEGATIVE

## 2019-12-28 LAB — LIPASE, BLOOD: Lipase: 23 U/L (ref 11–51)

## 2019-12-28 LAB — GLUCOSE, CAPILLARY
Glucose-Capillary: 107 mg/dL — ABNORMAL HIGH (ref 70–99)
Glucose-Capillary: 64 mg/dL — ABNORMAL LOW (ref 70–99)

## 2019-12-28 LAB — TROPONIN I (HIGH SENSITIVITY)
Troponin I (High Sensitivity): 12 ng/L (ref <18)
Troponin I (High Sensitivity): 9 ng/L (ref <18)

## 2019-12-28 LAB — LACTIC ACID, PLASMA
Lactic Acid, Venous: 3 mmol/L (ref 0.5–1.9)
Lactic Acid, Venous: 2.2 mmol/L (ref 0.5–1.9)

## 2019-12-28 MED ORDER — IOHEXOL 300 MG/ML  SOLN
100.0000 mL | Freq: Once | INTRAMUSCULAR | Status: AC | PRN
  Administered 2019-12-28: 100 mL via INTRAVENOUS

## 2019-12-28 MED ORDER — ONDANSETRON HCL 4 MG PO TABS: 4.0000 mg | ORAL_TABLET | Freq: Four times a day (QID) | ORAL | Status: DC | PRN

## 2019-12-28 MED ORDER — SODIUM CHLORIDE 0.9 % IV BOLUS
1000.0000 mL | Freq: Once | INTRAVENOUS | Status: AC
  Administered 2019-12-28: 1000 mL via INTRAVENOUS

## 2019-12-28 MED ORDER — SODIUM CHLORIDE (PF) 0.9 % IJ SOLN
INTRAMUSCULAR | Status: AC
  Filled 2019-12-28: qty 50

## 2019-12-28 MED ORDER — VANCOMYCIN HCL IN DEXTROSE 1-5 GM/200ML-% IV SOLN: 1000.0000 mg | Freq: Once | INTRAVENOUS | Status: DC

## 2019-12-28 MED ORDER — DEXTROSE 50 % IV SOLN
INTRAVENOUS | Status: AC
  Filled 2019-12-28: qty 50

## 2019-12-28 MED ORDER — HYDRALAZINE HCL 20 MG/ML IJ SOLN: 10.0000 mg | INTRAMUSCULAR | Status: DC | PRN

## 2019-12-28 MED ORDER — RUXOLITINIB PHOSPHATE 25 MG PO TABS: 25.0000 mg | ORAL_TABLET | Freq: Two times a day (BID) | ORAL | Status: DC

## 2019-12-28 MED ORDER — POTASSIUM CHLORIDE 10 MEQ/100ML IV SOLN
10.0000 meq | Freq: Once | INTRAVENOUS | Status: AC
  Administered 2019-12-28: 10 meq via INTRAVENOUS
  Filled 2019-12-28: qty 100

## 2019-12-28 MED ORDER — METRONIDAZOLE IN NACL 5-0.79 MG/ML-% IV SOLN
500.0000 mg | Freq: Once | INTRAVENOUS | Status: AC
  Administered 2019-12-28: 500 mg via INTRAVENOUS
  Filled 2019-12-28: qty 100

## 2019-12-28 MED ORDER — VANCOMYCIN HCL 1750 MG/350ML IV SOLN
1750.0000 mg | Freq: Once | INTRAVENOUS | Status: AC
  Administered 2019-12-28: 1750 mg via INTRAVENOUS
  Filled 2019-12-28: qty 350

## 2019-12-28 MED ORDER — SODIUM CHLORIDE 0.9 % IV SOLN: INTRAVENOUS | Status: AC

## 2019-12-28 MED ORDER — HEPARIN BOLUS VIA INFUSION
2000.0000 [IU] | Freq: Once | INTRAVENOUS | Status: AC
  Administered 2019-12-28: 2000 [IU] via INTRAVENOUS
  Filled 2019-12-28: qty 2000

## 2019-12-28 MED ORDER — STERILE WATER FOR INJECTION IJ SOLN
INTRAMUSCULAR | Status: AC
  Filled 2019-12-28: qty 10

## 2019-12-28 MED ORDER — TIMOLOL MALEATE 0.5 % OP SOLN
1.0000 [drp] | Freq: Every day | OPHTHALMIC | Status: DC
  Administered 2019-12-29 – 2020-01-02 (×4): 1 [drp] via OPHTHALMIC
  Filled 2019-12-28: qty 5

## 2019-12-28 MED ORDER — SODIUM CHLORIDE 0.9 % IV SOLN
2.0000 g | Freq: Two times a day (BID) | INTRAVENOUS | Status: DC
  Administered 2019-12-29 – 2020-01-02 (×9): 2 g via INTRAVENOUS
  Filled 2019-12-28 (×11): qty 2

## 2019-12-28 MED ORDER — ALTEPLASE 2 MG IJ SOLR
2.0000 mg | Freq: Once | INTRAMUSCULAR | Status: AC
  Administered 2019-12-28: 2 mg
  Filled 2019-12-28: qty 2

## 2019-12-28 MED ORDER — METRONIDAZOLE IN NACL 5-0.79 MG/ML-% IV SOLN
500.0000 mg | Freq: Three times a day (TID) | INTRAVENOUS | Status: DC
  Administered 2019-12-29 – 2020-01-02 (×14): 500 mg via INTRAVENOUS
  Filled 2019-12-28 (×14): qty 100

## 2019-12-28 MED ORDER — ACETAMINOPHEN 325 MG PO TABS: 650.0000 mg | ORAL_TABLET | Freq: Four times a day (QID) | ORAL | Status: DC | PRN

## 2019-12-28 MED ORDER — INSULIN ASPART 100 UNIT/ML ~~LOC~~ SOLN
0.0000 [IU] | Freq: Three times a day (TID) | SUBCUTANEOUS | Status: DC
  Administered 2019-12-30 – 2020-01-01 (×3): 1 [IU] via SUBCUTANEOUS

## 2019-12-28 MED ORDER — DEXTROSE 50 % IV SOLN
12.5000 g | INTRAVENOUS | Status: AC
  Administered 2019-12-28: 12.5 g via INTRAVENOUS

## 2019-12-28 MED ORDER — LATANOPROST 0.005 % OP SOLN
1.0000 [drp] | Freq: Every day | OPHTHALMIC | Status: DC
  Administered 2019-12-29 – 2020-01-01 (×5): 1 [drp] via OPHTHALMIC
  Filled 2019-12-28: qty 2.5

## 2019-12-28 MED ORDER — CLOPIDOGREL BISULFATE 75 MG PO TABS
75.0000 mg | ORAL_TABLET | Freq: Every day | ORAL | Status: DC
  Administered 2019-12-30 – 2020-01-01 (×3): 75 mg via ORAL
  Filled 2019-12-28 (×4): qty 1

## 2019-12-28 MED ORDER — ACETAMINOPHEN 650 MG RE SUPP: 650.0000 mg | Freq: Four times a day (QID) | RECTAL | Status: DC | PRN

## 2019-12-28 MED ORDER — SODIUM CHLORIDE 0.9 % IV SOLN
2.0000 g | Freq: Once | INTRAVENOUS | Status: AC
  Administered 2019-12-28: 2 g via INTRAVENOUS
  Filled 2019-12-28: qty 2

## 2019-12-28 MED ORDER — HEPARIN (PORCINE) 25000 UT/250ML-% IV SOLN
1100.0000 [IU]/h | INTRAVENOUS | Status: DC
  Administered 2019-12-28 – 2019-12-30 (×3): 1100 [IU]/h via INTRAVENOUS
  Filled 2019-12-28 (×3): qty 250

## 2019-12-28 MED ORDER — ONDANSETRON HCL 4 MG/2ML IJ SOLN
4.0000 mg | Freq: Four times a day (QID) | INTRAMUSCULAR | Status: DC | PRN
  Administered 2019-12-29: 4 mg via INTRAVENOUS
  Filled 2019-12-28: qty 2

## 2019-12-28 NOTE — H&P (Addendum)
History and Physical    Amanda [name] HCW:[id number] DOB: 1940/11/07 DOA: 12/28/2019  PCP: [name], NP  Patient coming from: Home.  Chief Complaint: Nausea vomiting and diarrhea confusion.  HPI: Amanda [name] is a 79 y.o. female with history of CAD status post stenting, hypertension, diabetes mellitus type 2, polycythemia vera, cirrhosis of the liver has been found to be having increasing weakness over the last 1 month and has progressed to become bedbound over the last week.  Patient over the last 10 days has been having increasing nausea vomiting and diarrhea with abdominal pain.  Abdominal pain is mostly epigastric area.  Denies any blood in the vomitus.  Has had multiple episodes of vomiting or diarrhea.  Denies taking any recent antibiotics.  Denies any fever chills chest pain or shortness of breath.  Given the symptoms and increasing weakness patient was brought to the ER.  Patient husband states that over the last 1 week patient has not taken any of her medications because of the vomiting.  ED Course: In the ER patient's Covid test was negative.  Labs show creatinine 1.1 potassium 2.8 high sensitive troponin IX and XII WBC count 44.2 lactic acid of 3 improved to 2.2 after hydration chest x-ray shows possible left-sided infiltrates EKG shows sinus tachycardia CT abdomen pelvis done shows features concerning for portal vein and hepatic vein thrombosis with possibility of hepatocellular carcinoma changes in the liver and also pancolitis.  Patient was started on empiric antibiotics after cultures were obtained.  Also started on IV heparin for portal vein thrombosis.  Admitted for further management.  At the time of my exam patient is alert awake and oriented to her name and place moves all extremities.  Has good sensation of the extremities.  Able to move all extremities without difficulty.  Good reflexes.  Review of Systems: As per HPI, rest all negative.   Past Medical History:    Diagnosis Date  . Allergic rhinitis 01/23/2016  . C. difficile colitis   . CAD (coronary artery disease)    a. s/p STEMI in 01/2015 with 95% LCx stenosis and distal 80% LCx stenosis (DESx2 placed)  . Cancer ([place])    SKIN  . Candidiasis of skin 09/30/2014  . Cirrhosis ([place])   . Depression   . Diabetes mellitus 2008  . Gout   . Herpes simplex   . Hyperglycemia 05/31/2013  . Hyperlipidemia   . Hypertension   . Myocardial infarction ([place])   . Neuromuscular disorder ([place])    BELL PALSY  . Obesity   . Polycythemia    Dr. Elease Hashimoto- [facility]  . Psoriasis     Past Surgical History:  Procedure Laterality Date  . ANKLE FRACTURE SURGERY    . BIOPSY  05/26/2018   Procedure: BIOPSY;  Surgeon: [name], MD;  Location: [facility] ENDOSCOPY;  Service: Endoscopy;;  . BREAST SURGERY Left    milk duct  . CATARACT EXTRACTION, BILATERAL    . COLONOSCOPY    . ESOPHAGOGASTRODUODENOSCOPY N/A 05/26/2018   Procedure: ESOPHAGOGASTRODUODENOSCOPY (EGD);  Surgeon: [name], MD;  Location: [facility] ENDOSCOPY;  Service: Endoscopy;  Laterality: N/A;  . IR IMAGING GUIDED PORT INSERTION  08/30/2019  . IR PARACENTESIS  05/25/2018  . LEFT HEART CATHETERIZATION WITH CORONARY ANGIOGRAM N/A 01/27/2015   Procedure: LEFT HEART CATHETERIZATION WITH CORONARY ANGIOGRAM;  Surgeon: [name], MD;  Location: [facility] CATH LAB;  Service: Cardiovascular;  Laterality: N/A;  . TONSILLECTOMY    . TOTAL ABDOMINAL HYSTERECTOMY W/  BILATERAL SALPINGOOPHORECTOMY     for heavy periods with appendectomy     reports that she quit smoking about 33 years ago. Her smoking use included cigarettes. She quit after 48.00 years of use. She has never used smokeless tobacco. She reports that she does not drink alcohol or use drugs.  Allergies  Allergen Reactions  . Penicillins Swelling    Has patient had a PCN reaction causing immediate rash, facial/tongue/throat swelling, SOB or lightheadedness with hypotension: No Has patient had a PCN  reaction causing severe rash involving mucus membranes or skin necrosis: No Has patient had a PCN reaction that required hospitalization: No Has patient had a PCN reaction occurring within the last 10 years: No If all of the above answers are "NO", then may proceed with Cephalosporin use.  Marland Kitchen Lisinopril Cough  . Tape Hives  . Doxycycline Hives, Swelling and Rash  . Latex Hives, Itching and Rash    Family History  Problem Relation Age of Onset  . Diabetes Mother   . Heart disease Mother        CAD  . Hyperlipidemia Mother   . Hypertension Mother   . Kidney disease Mother   . Kidney disease Father   . Breast cancer Maternal Aunt   . Heart disease Maternal Grandmother   . Heart disease Other        maternal aunts and uncles  . Colon cancer Neg Hx   . Esophageal cancer Neg Hx     Prior to Admission medications   Medication Sig Start Date End Date Taking? Authorizing Provider  acetaminophen (TYLENOL) 325 MG tablet Take 2 tablets (650 mg total) by mouth every 6 (six) hours as needed for mild pain (or Fever >/= 101). 05/27/18  Yes Georgette Shell, MD  amLODipine (NORVASC) 5 MG tablet Take 1 tablet (5 mg total) by mouth daily. 09/19/19 12/28/19 Yes [name], MD  clopidogrel (PLAVIX) 75 MG tablet TAKE 1 TABLET(75 MG) BY MOUTH DAILY Patient taking differently: Take 75 mg by mouth daily.  11/27/19  Yes Nafziger, Tommi Rumps, NP  diazepam (VALIUM) 5 MG tablet Valium 5 mg by mouth at bedtime as needed. Patient taking differently: Take 5 mg by mouth at bedtime as needed for anxiety.  08/24/19  Yes Ennever, Rudell Cobb, MD  Dulaglutide (TRULICITY ) Inject 1 pen into the skin once a week.   Yes [provider]  ezetimibe (ZETIA) 10 MG tablet TAKE 1 TABLET(10 MG) BY MOUTH DAILY Patient taking differently: Take 10 mg by mouth daily.  11/07/19  Yes [name], MD  fluconazole (DIFLUCAN) 150 MG tablet Take one tab weekly for two weeks Patient taking differently: Take 150 mg by mouth See  admin instructions. Take one tab weekly for two weeks prn 12/11/19  Yes Nafziger, Tommi Rumps, NP  furosemide (LASIX) 40 MG tablet TAKE 1 TABLET(40 MG) BY MOUTH DAILY Patient taking differently: Take 40 mg by mouth daily.  10/16/19  Yes [name], MD  insulin regular human CONCENTRATED (HUMULIN R U-500 KWIKPEN) 500 UNIT/ML kwikpen Inject 90 Units into the skin daily.   Yes [provider]  JAKAFI 25 MG tablet TAKE 1 TABLET (25 MG TOTAL) BY MOUTH 2 TIMES DAILY. Patient taking differently: Take 25 mg by mouth 2 (two) times daily.  09/05/19  Yes Ennever, Rudell Cobb, MD  latanoprost (XALATAN) 0.005 % ophthalmic solution Place 1 drop into both eyes at bedtime.  03/23/19  Yes [provider]  lidocaine-prilocaine (EMLA) cream Apply 1  application topically as needed. Please apply EMLA over the Port-A-Cath site 1 hour prior to coming to our office. 08/17/19  Yes Volanda Napoleon, MD  mirtazapine (REMERON) 15 MG tablet Take 1 tablet (15 mg total) by mouth at bedtime. Patient taking differently: Take 30 mg by mouth at bedtime.  08/17/19  Yes Nafziger, Tommi Rumps, NP  oxyCODONE-acetaminophen (PERCOCET/ROXICET) 5-325 MG tablet Take 1-2 tablets by mouth every 8 (eight) hours as needed for severe pain. 09/28/19  Yes Nafziger, Tommi Rumps, NP  potassium chloride (KLOR-CON M10) 10 MEQ tablet Take 1 tablet (10 mEq total) by mouth 2 (two) times daily. Patient taking differently: Take 10 mEq by mouth daily.  10/10/19  Yes Milus Banister, MD  promethazine (PHENERGAN) 25 MG tablet Take 0.5 tablets (12.5 mg total) by mouth every 6 (six) hours as needed for nausea or vomiting. 05/28/18  Yes Emokpae, Ejiroghene E, MD  timolol (TIMOPTIC) 0.5 % ophthalmic solution 1 drop daily. 10/07/19  Yes [provider]  triamcinolone cream (KENALOG) 0.1 % Apply 1 application topically 2 (two) times daily. 12/11/19  Yes Nafziger, Tommi Rumps, NP  Blood Glucose Monitoring Suppl (ACCU-CHEK GUIDE) w/Device KIT 1 kit by Does not apply route  as directed. 01/25/18   [name], MD  glucose blood (ACCU-CHEK GUIDE) test strip Use to check blood sugars 3 times daily 01/25/18   [name], MD  Insulin Pen Needle (BD PEN NEEDLE NANO U/F) 32G X 4 MM MISC Use to inject insulin 2 times a day Patient taking differently: 90 Units daily.  09/07/19   [name], MD  ONETOUCH VERIO test strip USE TO TEST BLOOD SUGAR THREE TIMES DAILY 05/01/18   [name], MD  predniSONE (DELTASONE) 50 MG tablet One tab PO daily for 5 days. Patient not taking: Reported on 12/28/2019 12/14/19   Silverio Decamp, MD    Physical Exam: Constitutional: Moderately built and nourished. Vitals:   12/28/19 2000 12/28/19 2015 12/28/19 2030 12/28/19 2045  BP: 136/61 (!) 129/51 (!) 132/92 (!) 125/52  Pulse: 84 80 83 (!) 59  Resp: _0 Temp:      TempSrc:      SpO2: 98% 96% 96% 96%   Eyes: Anicteric no pallor. ENMT: No discharge from the ears eyes nose or mouth. Neck: No mass felt.  No neck rigidity. Respiratory: No rhonchi or crepitations. Cardiovascular: S1-S2 heard. Abdomen: Soft nontender bowel sounds present with no guarding or rigidity. Musculoskeletal: No edema. Skin: No rash. Neurologic: Alert awake oriented to name and place moves all extremities 5 x 5.  No facial asymmetry tongue is midline. Psychiatric: Appears normal.   Labs on Admission: I have personally reviewed following labs and imaging studies  CBC: Recent Labs  Lab 12/28/19 1534  WBC 44.2*  NEUTROABS 35.4*  HGB 13.7  HCT 42.6  MCV 92.4  PLT 829   Basic Metabolic Panel: Recent Labs  Lab 12/28/19 1534  NA 137  K 2.8*  CL 99  CO2 24  GLUCOSE 67*  BUN 9  CREATININE 1.18*  CALCIUM 8.9   GFR: CrCl cannot be calculated (Unknown ideal weight.). Liver Function Tests: Recent Labs  Lab 12/28/19 1534  AST 30  ALT 7  ALKPHOS 117  BILITOT 2.0*  PROT 6.6  ALBUMIN 3.5   Recent Labs  Lab 12/28/19 1534  LIPASE 23   Recent Labs  Lab  12/28/19 1535  AMMONIA 56*   Coagulation Profile: No results for input(s): INR, PROTIME in the last 168 hours. Cardiac  Enzymes: No results for input(s): CKTOTAL, CKMB, CKMBINDEX, TROPONINI in the last 168 hours. BNP (last 3 results) No results for input(s): PROBNP in the last 8760 hours. HbA1C: No results for input(s): HGBA1C in the last 72 hours. CBG: No results for input(s): GLUCAP in the last 168 hours. Lipid Profile: No results for input(s): CHOL, HDL, LDLCALC, TRIG, CHOLHDL, LDLDIRECT in the last 72 hours. Thyroid Function Tests: No results for input(s): TSH, T4TOTAL, FREET4, T3FREE, THYROIDAB in the last 72 hours. Anemia Panel: No results for input(s): VITAMINB12, FOLATE, FERRITIN, TIBC, IRON, RETICCTPCT in the last 72 hours. Urine analysis:    Component Value Date/Time   COLORURINE YELLOW 08/10/2019 2008   APPEARANCEUR CLEAR 08/10/2019 2008   LABSPEC 1.008 08/10/2019 2008   PHURINE 5.0 08/10/2019 2008   GLUCOSEU NEGATIVE 08/10/2019 2008   GLUCOSEU NEGATIVE 01/10/2018 1217   HGBUR SMALL (A) 08/10/2019 2008   HGBUR large 05/07/2010 0845   BILIRUBINUR NEGATIVE 08/10/2019 2008   BILIRUBINUR neg 01/26/2016 1003   KETONESUR NEGATIVE 08/10/2019 2008   PROTEINUR NEGATIVE 08/10/2019 2008   UROBILINOGEN 0.2 01/10/2018 1217   NITRITE NEGATIVE 08/10/2019 2008   LEUKOCYTESUR TRACE (A) 08/10/2019 2008   Sepsis Labs: _0 (procalcitonin:4,lacticidven:4) ) Recent Results (from the past 240 hour(s))  Respiratory Panel by RT PCR (Flu A&B, Covid) - Nasopharyngeal Swab     Status: None   Collection Time: 12/28/19  4:15 PM   Specimen: Nasopharyngeal Swab  Result Value Ref Range Status   SARS Coronavirus 2 by RT PCR NEGATIVE NEGATIVE Final    Comment: (NOTE) SARS-CoV-2 target nucleic acids are NOT DETECTED. The SARS-CoV-2 RNA is generally detectable in upper respiratoy specimens during the acute phase of infection. The lowest concentration of SARS-CoV-2 viral copies this  assay can detect is 131 copies/mL. A negative result does not preclude SARS-Cov-2 infection and should not be used as the sole basis for treatment or other patient management decisions. A negative result may occur with  improper specimen collection/handling, submission of specimen other than nasopharyngeal swab, presence of viral mutation(s) within the areas targeted by this assay, and inadequate number of viral copies (<131 copies/mL). A negative result must be combined with clinical observations, patient history, and epidemiological information. The expected result is Negative. Fact Sheet for Patients:  PinkCheek.be Fact Sheet for Healthcare Providers:  GravelBags.it This test is not yet ap proved or cleared by the [place] FDA and  has been authorized for detection and/or diagnosis of SARS-CoV-2 by FDA under an Emergency Use Authorization (EUA). This EUA will remain  in effect (meaning this test can be used) for the duration of the COVID-19 declaration under Section 564(b)(1) of the Act, 21 U.S.C. section 360bbb-3(b)(1), unless the authorization is terminated or revoked sooner.    Influenza A by PCR NEGATIVE NEGATIVE Final   Influenza B by PCR NEGATIVE NEGATIVE Final    Comment: (NOTE) The Xpert Xpress SARS-CoV-2/FLU/RSV assay is intended as an aid in  the diagnosis of influenza from Nasopharyngeal swab specimens and  should not be used as a sole basis for treatment. Nasal washings and  aspirates are unacceptable for Xpert Xpress SARS-CoV-2/FLU/RSV  testing. Fact Sheet for Patients: PinkCheek.be Fact Sheet for Healthcare Providers: GravelBags.it This test is not yet approved or cleared by the [place] FDA and  has been authorized for detection and/or diagnosis of SARS-CoV-2 by  FDA under an Emergency Use Authorization (EUA). This EUA will remain  in  effect (meaning this test can be used) for the duration of the  Covid-19 declaration under Section 564(b)(1) of the Act, 21  U.S.C. section 360bbb-3(b)(1), unless the authorization is  terminated or revoked. Performed at [facility], [place] [address], [place], [place] [address]      Radiological Exams on Admission: CT ABDOMEN PELVIS W CONTRAST  Result Date: 12/28/2019 CLINICAL DATA:  Abdominal distension, nausea and vomiting the past 10 days. Increased weakness and confusion. EXAM: CT ABDOMEN AND PELVIS WITH CONTRAST TECHNIQUE: Multidetector CT imaging of the abdomen and pelvis was performed using the standard protocol following bolus administration of intravenous contrast. CONTRAST:  146m OMNIPAQUE IOHEXOL 300 MG/ML  SOLN COMPARISON:  May 23, 2018. January 04, 2018. January 28, 2014. FINDINGS: Lower chest: Trace pleural fluid and mild bronchitis in the left lower lobe. Mild cardiomegaly. Four-vessel coronary calcification. Partial imaging of a right central vascular catheter port, its tip in the right cardiac atrium. Thoracic aortic calcified atherosclerosis. Hepatobiliary: A partially thrombosed main portal vein extending from its confluence with the mesenteric vein into the proximal portion of the right portal vein occupying approximately 50% of the vessel lumen. No thrombus in these regions on the August 2019 study. Patent right anterior and left portal veins. Cirrhosis with small amount of ascites. A 4 cm heterogeneous parenchyma in the right hepatic dome, incompletely characterized, and possibly secondary to the venous thrombosis or an underlying malignancy. A 5.2 cm gallbladder dilatation with dense sludge or tiny stones layering at the gallbladder neck, which could partially obstruct the cystic duct. No intrahepatic biliary duct dilatation. An 8 mm dilatation of the proximal common bile duct with normal distal tapering. Pancreas: Diffuse moderate atrophy and fatty infiltration of  the pancreas. No acute pancreatitis. Spleen: Splenomegaly measuring 15 by 6 by 14 cm, similar. A 9 mm and 10 mm hypoattenuating lesion in the splenic parenchyma, similarly sized compared to March 2019. Although these are incompletely characterized, their stability suggests a benign hemorrhagic or cystic etiology and no further imaging characterization is indicated according to the ACR criteria. Adrenals/Urinary Tract: Punctate nonobstructing right renal calculus. Stomach/Bowel: Apparent mild diffuse colonic wall thickening that could be due in part to the adjacent ascites. Mild diverticulosis in the distal descending and sigmoid colon. No bowel obstruction. Surgically absent or decompressed appendix. Vascular/Lymphatic: Possible small focal dependent thrombus versus CT telemetry lead artifact in the inferior cava intrahepatic portion with less than 25% luminal narrowing. Patent and ectatic splenic vein. Abdominal aorta calcified atherosclerosis without aneurysmal dilatation. Circumferential calcified atherosclerosis resulting in 75% luminal narrowing of the celiac artery and 50% narrowing of the superior mesenteric artery. An abnormally enlarged 34 by 15 mm periportal lymph node. Clustered interaortocaval and para-aortic nodes measuring up to 12 by 26 mm. Reproductive: Age-appropriate atrophy of the uterine myometrium. A 10 mm endometrial stripe thickness in the sagittal plane, abnormally thickened for an 79year old female. Please note the patient medical record states a history of hysterectomy. Probable bilateral oophorectomy. No adnexal cyst or mass. Other: A 25 mm simple cyst in the right lateral hip subdermal subcutaneous fat plane. No associated inflammatory change or wall thickening. Musculoskeletal: Diffuse bone demineralization and moderate to advanced skeletal degenerative changes most pronounced at the lumbosacral facets. A 13 mm L4 vertebral body hemangioma present since April 2015. I discussed the  critical value/emergent results and further recommended imaging by telephone at the time of interpretation on 12/28/2019 at 7:55 pm with provider Dr. MFrancia Greaves, who verbally acknowledged these results. 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 A new diagnosis of nonocclusive thrombosis  in the main portal vein extending from its confluence with the mesenteric vein into the proximal portion of the right portal vein occupying approximately 50% of the vessel lumen. Small focal nonocclusive thrombus in the intrahepatic inferior vena cava. These may be secondary to the patient's cirrhosis, however, correlation with lab values including an alpha fetoprotein marker may be useful. Partial characterization of heterogeneous parenchyma in the right hepatic dome concerning for a possible hepatocellular carcinoma. Dedicated MR or CT liver protocol imaging without and with intravenous contrast was recommended. Probable recent viral infection with mild bronchitis and trace pleural effusion in the left lower lobe, a mild uncomplicated pancolitis, and gallbladder hydrops, a 5.2 cm gallbladder dilatation with dense sludge or tiny stones layering at the gallbladder neck, which could partially obstruct the cystic duct. If there is concern for acute cholecystitis or cystic duct obstruction, hepatobiliary scintigraphy could be considered. Abnormally thickened endometrial stripe for an 79 year old female. Recommend further gynecological consultation and nonemergent pelvis ultrasonography. A nonspecific abnormally enlarged periportal lymph node and abnormally enlarged retroperitoneal lymph node with clustered smaller nodes, potentially reactive or metastatic. Punctate nonobstructing right renal calculus. Four-vessel coronary calcification. A 25 mm subdermal cyst in the right lateral hip, possibly a sebaceous cyst. No associated inflammatory change. Thoracoabdominal aortic calcified atherosclerosis.   (ICD10-I70.0). Electronically Signed   By: Revonda Humphrey   On: 12/28/2019 20:21   DG Chest Port 1 View  Result Date: 12/28/2019 CLINICAL DATA:  Weakness. EXAM: PORTABLE CHEST 1 VIEW COMPARISON:  August 10, 2019. FINDINGS: Stable cardiomegaly. No pneumothorax or pleural effusion is noted. Right lung is clear. Mild left lingular opacity is noted concerning for atelectasis or possibly infiltrate. Interval placement of right internal jugular Port-A-Cath with distal tip in expected position of cavoatrial junction. Bony thorax is unremarkable. IMPRESSION: Mild left lingular opacity is noted concerning for atelectasis or possibly infiltrate. Electronically Signed   By: Marijo Conception M.D.   On: 12/28/2019 16:11    EKG: Independently reviewed.  Sinus tachycardia.  Assessment/Plan Principal Problem:   SIRS (systemic inflammatory response syndrome) (HCC) Active Problems:   Essential hypertension, benign   [name] (HCC)   Essential hypertension   Hepatic cirrhosis (HCC)   Type 2 diabetes mellitus with diabetic polyneuropathy ([place])    1. SIRS source not clear could be from nausea vomiting and diarrhea and CAT scan shows pancolitis for which stool studies have been ordered.  Chest x-ray also shows possible infiltrate though patient denies any coughing.  For now patient is on empiric antibiotics follow cultures. 2. 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 3. History of polycythemia vera presently has significant leukocytosis not sure if it is due to infection or patient missing her medication Jakafi.  May need input from Dr. Burney Gauze in the morning patient's oncologist.  Presently holding of Jakafi due to patient has not taken recently and also  4. Abnormal CT findings concerning for hepatocellular carcinoma for which will need GI consult. 5. Generalized weakness and patient has not been able to get out of the bed.  Could be from  dehydration and on my exam patient has good deep tendon reflexes and is able to move all extremities at this time.  Will get CT head and C-spine for now and closely observe. 6. Acute encephalopathy likely from #1.  Could be infectious or could also be from hepatic encephalopathy given elevated ammonia levels.  Patient is hyperglycemic could be contributing.  I  have ordered lactulose.  Closely monitor. 7. History of CAD status post stenting denies any chest pain.  On Plavix. 8. Hypertension on amlodipine. 9. Cirrhosis of the liver secondary to [facility]  Presently receiving fluids.  Small ascites seen in the CAT scan. 10. Thickened endometrial stripe seen in the CAT scan will need OB/GYN follow-up. 11. Hypokalemia likely from vomiting replace and recheck.  Check magnesium level with next blood draw. 12. Chronic kidney disease stage III appears to be at baseline. 13. Diabetes mellitus type 2 presently mildly hypoglycemic will hold off any long-acting insulin close to follow CBGs patient is requesting food.  Follow CBGs.  Given that patient has multiple acute changes going on with multiple comorbidities will need close monitoring for any deterioration in inpatient status.   DVT prophylaxis: Heparin infusion for portal vein thrombosis. Code Status: DNR confirmed with patient husband. Family Communication: Patient husband. Disposition Plan: To be determined. Consults called: None. Admission status: Inpatient.   Rise Patience MD [facility] Pager [phone].  If 7PM-7AM, please contact night-coverage [email or web address] Password [facility]  12/28/2019, 10:48 PM

## 2019-12-28 NOTE — Telephone Encounter (Signed)
I agree she is likely going to need IV hydration and other tests that we cannot do in the office

## 2019-12-28 NOTE — ED Notes (Signed)
Phlebotomy at bedside.

## 2019-12-28 NOTE — ED Provider Notes (Signed)
Care assumed from prior ED provider.  \  CT imaging reveals evidence of new portal vein thrombosis and IVC thrombosis. These are nonocclusive.  Additional CT findings as documented in radiology read.  Case discussed with admitting hospitalist service.  They request initiation of heparin drip.  Broad-spectrum antibiotics already initiated in the ED.  Hospital service will evaluate for admission.     Valarie Merino, MD 12/28/19 2059

## 2019-12-28 NOTE — ED Notes (Signed)
XR at bedside

## 2019-12-28 NOTE — ED Notes (Signed)
Patient's husband at bedside

## 2019-12-28 NOTE — Progress Notes (Signed)
CSW spoke to EPD who states pt may be admitted, but is unsafe to return home a this time and placement should be attempted if family is agreeable.  CSW spoke to pt's spouse who states that pt has been bed bound for two weeks and that pt was for a time walking to the bathroom with a walker, but within the last two week, the pt had to go to the bathroom via wheelchair..    [name] states he does not want his wife to go to [facility] and one other facility he could not recall the name of, but refused to allow the CSW to begin the referral process.  [name] did ask about [facility] and stated it would be a preferred SNF.  CSW will continue to follow for D/C needs.  Alphonse Guild. Aniza Shor, LCSW, LCAS, CSI Transitions of Care Clinical Social Worker Care Coordination Department Ph: [phone]

## 2019-12-28 NOTE — Telephone Encounter (Signed)
[name], [name], is requesting to speak to [facility] regarding his wife's health. He did not want to give any information and asked, me to send a message and that it's urgent. Thanks

## 2019-12-28 NOTE — ED Notes (Signed)
Unsuccessful blood draw attempt x2. 

## 2019-12-28 NOTE — ED Triage Notes (Signed)
Per EMS, patient from home, c/o N/V/D x10 days. Husband reports increased weakness and confusion. Husband reports intermittent periods of confusion at baseline but consistent confusion since getting sick. Non ambulatory at baseline.   20g L FA 229m NS [id number] with EMS

## 2019-12-28 NOTE — Telephone Encounter (Signed)
Call received from patient's husband to inform Dr. [name] that pt has not been eating or getting out of bed for approx one week.  Dr. [name] notified and would like for pt.'s husband to contact pt.'s PCP regarding pt.'s condition.  Call placed back to patient's husband and notified him to contact pt.'s PCP per order of Dr. [name].  Pt.'s husband states that he is most likely going to call 911 for patient.  Dr. [name] notified.

## 2019-12-28 NOTE — TOC Initial Note (Signed)
Transition of Care [facility]) - Initial/Assessment Note    Patient Details  Name: ADRIEANA FENNELLY MRN: [id number] Date of Birth: 12-21-1940  Transition of Care [facility]) CM/SW Contact:    Claudine Mouton, LCSW Phone Number: 12/28/2019, 7:02 PM  Clinical Narrative:             CSW spoke to EPD who states pt may be admitted, but is unsafe to return home a this time and placement should be attempted if family is agreeable.  CSW spoke to pt's spouse who states that pt has been bed bound for two weeks and that pt was for a time walking to the bathroom with a walker, but within the last two week, the pt had to go to the bathroom via wheelchair..    [name] states he does not want his wife to go to [facility] and [facility], but refused to allow the CSW to begin the referral process.  [name] did ask about [facility] and stated it would be a preferred SNF.          Expected Discharge Plan: Skilled Nursing Facility Barriers to Discharge: Inadequate or no insurance   Patient Goals and CMS Choice Patient states their goals for this hospitalization and ongoing recovery are:: To find a solution so the pt can go to therapy, per the spouse.      Expected Discharge Plan and Services Expected Discharge Plan: [place] arrangements for the past 2 months: Single Family Home                                    Prior Living Arrangements/Services Living arrangements for the past 2 months: Single Family Home Lives with:: Spouse   Do you feel safe going back to the place where you live?: Yes      Need for Family Participation in Patient Care: Yes (Comment) Care giver support system in place?: Yes (comment)      Activities of Daily Living      Permission Sought/Granted Permission sought to share information with : Facility Art therapist granted to share information with : No              Emotional Assessment        Orientation: : Fluctuating Orientation (Suspected and/or reported Sund[place])      Admission diagnosis:  weakness with vomitting Patient Active Problem List   Diagnosis Date Noted  . Worsening lower extremity weakness 12/14/2019  . Leg swelling 08/21/2019  . Hypotension due to hypovolemia 08/10/2019  . Acute renal failure superimposed on stage 3 chronic kidney disease ([place]) 08/10/2019  . Acute blood loss anemia 05/23/2018  . Dehydration 03/22/2018  . AKI (acute kidney injury) ([place]) 03/22/2018  . History of CVA (cerebrovascular accident) 03/22/2018  . Hip injury, left, subsequent encounter 11/08/2017  . Iron deficiency anemia 12/22/2016  . OSA (obstructive sleep apnea) 04/21/2016  . Osteopenia 04/07/2016  . Lung nodule, solitary 02/05/2016  . Aortic dilatation ([place]) 02/05/2016  . Dyspnea 01/28/2016  . Allergic rhinitis 01/23/2016  . Hepatic cirrhosis ([place]) 10/28/2015  . Irritable bowel syndrome 08/12/2015  . Obesity (BMI 30-39.9) 04/30/2015  . Diabetic retinopathy of both eyes ([place]) 04/18/2015  . Former smoker 04/18/2015  . GAD (generalized anxiety disorder) 04/18/2015  . Status post primary angioplasty with coronary stent 04/18/2015  . Coronary artery disease involving native coronary artery  03/01/2015  . Chronic diastolic CHF (congestive heart failure) ([place]) 02/01/2015  . ST elevation myocardial infarction (STEMI) involving left circumflex coronary artery in recovery phase ([place]) 01/27/2015  . Idioventricular rhythm ([place])   . Diabetic peripheral neuropathy associated with type 2 diabetes mellitus ([place]) 01/23/2015  . Type 2 diabetes mellitus with diabetic polyneuropathy ([place]) 01/23/2015  . Thrombocytosis ([place]) 07/04/2014  . Cholelithiasis 02/07/2014  . Chronic diarrhea 01/29/2014  . [name] ([place]) 05/31/2013  . Essential hypertension 05/31/2013  . History of Bell's palsy 05/26/2013  . DERMATITIS, ATOPIC 11/16/2010  . DIZZINESS 11/16/2010  . DIASTOLIC DYSFUNCTION  77/41/2878  . Hyperlipidemia 12/15/2009  . Essential hypertension, benign 11/24/2009  . PSORIASIS 11/24/2009  . Depression 09/13/2009  . 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[place]) 07/08/2008   PCP:  [name], NP Pharmacy:   [facility] [place], [place] [address] [place] [address] Phone: [phone] Fax: [phone]  [place], [place] [address] [place] [address] Phone: [phone] Fax: [phone]     Social Determinants of Health (SDOH) Interventions    Readmission Risk Interventions No flowsheet data found.   CSW will continue to follow for D/C needs.  2nd shift ED CSW will leave handoff for 1st shift ED CSW.  Alphonse Guild. Matthan Sledge, LCSW, LCAS, CSI Transitions of Care Clinical Social Worker Care Coordination Department Ph: [phone]

## 2019-12-28 NOTE — Telephone Encounter (Signed)
Spoke to [employer].  He states pt has been lying in the bed for 1.5 wks.  She is not eating.  She can hardly stand.  She has to have a wheelchair to go to the restroom.  She has become very forgetful.  Cannot remember things that happened an hour ago but can recall long term memory.  Doreatha Massed that he should take the pt to the ED for evaluation.  He stated Doriana wants to wait but I strongly urged him to call 911 as she needs medical attention.  Will forward to [facility] as [place].

## 2019-12-28 NOTE — ED Notes (Signed)
IV team at bedside 

## 2019-12-28 NOTE — Progress Notes (Signed)
A consult was received from an ED physician for vancomycin & cefepime per pharmacy dosing.  The patient's profile has been reviewed for ht/wt/allergies/indication/available labs.   A one time order has been placed for Vancomycin 1750 mg, Cefepime 2 gm and Flagyl 500 mg.    Further antibiotics/pharmacy consults should be ordered by admitting physician if indicated.                       Thank you,  Eudelia Bunch, Pharm.D [phone] 12/28/2019 4:21 PM

## 2019-12-28 NOTE — ED Notes (Signed)
Unable to collect second set of blood cultures as patient has been stuck multiple times.

## 2019-12-28 NOTE — Progress Notes (Signed)
ANTICOAGULATION CONSULT NOTE - Initial Consult  Pharmacy Consult for heparin Indication: portal vein thrombosis  Allergies  Allergen Reactions  . Penicillins Swelling    Has patient had a PCN reaction causing immediate rash, facial/tongue/throat swelling, SOB or lightheadedness with hypotension: No Has patient had a PCN reaction causing severe rash involving mucus membranes or skin necrosis: No Has patient had a PCN reaction that required hospitalization: No Has patient had a PCN reaction occurring within the last 10 years: No If all of the above answers are "NO", then may proceed with Cephalosporin use.  Marland Kitchen Lisinopril Cough  . Tape Hives  . Doxycycline Hives, Swelling and Rash  . Latex Hives, Itching and Rash    Patient Measurements:   Heparin Dosing Weight:  TBW 86.6 on 10/22/19  Vital Signs: Temp: 98.6 F (37 C) (03/12 1351) Temp Source: Oral (03/12 1351) BP: 132/92 (03/12 2030) Pulse Rate: 83 (03/12 2030)  Labs: Recent Labs    12/28/19 1534 12/28/19 1535  HGB 13.7  --   HCT 42.6  --   PLT 266  --   CREATININE 1.18*  --   TROPONINIHS 9 12    CrCl cannot be calculated (Unknown ideal weight.).   Medical History: Past Medical History:  Diagnosis Date  . Allergic rhinitis 01/23/2016  . C. difficile colitis   . CAD (coronary artery disease)    a. s/p STEMI in 01/2015 with 95% LCx stenosis and distal 80% LCx stenosis (DESx2 placed)  . Cancer ([place])    SKIN  . Candidiasis of skin 09/30/2014  . Cirrhosis ([place])   . Depression   . Diabetes mellitus 2008  . Gout   . Herpes simplex   . Hyperglycemia 05/31/2013  . Hyperlipidemia   . Hypertension   . Myocardial infarction ([place])   . Neuromuscular disorder ([place])    Julie-Ann Vanmaanen PALSY  . Obesity   . Polycythemia    Dr. Elease Hashimoto- [facility]  . Psoriasis     Assessment: 79 yo F in ED with CC of N/V/D x 10 days.  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   No  anticoagulants PTA. SCr 1.18, CBC WNL.   Goal of Therapy:  Heparin level 0.3-0.7 units/ml Monitor platelets by anticoagulation protocol: Yes   Plan:  Give 2000 units bolus x 1 Start heparin infusion at 1100 units/hr Check anti-Xa level in 8 hours and daily while on heparin Continue to monitor H&H and platelets  Eudelia Bunch, Pharm.D [phone] 12/28/2019 9:09 PM

## 2019-12-28 NOTE — ED Notes (Signed)
Patient transported to CT 

## 2019-12-28 NOTE — Progress Notes (Signed)
MEDICATION RELATED CONSULT NOTE - INITIAL   Pharmacy Consult for ruxolitinib phosphate (JAKAFI)  Indication: Oral chemo  Allergies  Allergen Reactions  . Penicillins Swelling    Has patient had a PCN reaction causing immediate rash, facial/tongue/throat swelling, SOB or lightheadedness with hypotension: No Has patient had a PCN reaction causing severe rash involving mucus membranes or skin necrosis: No Has patient had a PCN reaction that required hospitalization: No Has patient had a PCN reaction occurring within the last 10 years: No If all of the above answers are "NO", then may proceed with Cephalosporin use.  Marland Kitchen Lisinopril Cough  . Tape Hives  . Doxycycline Hives, Swelling and Rash  . Latex Hives, Itching and Rash    Patient Measurements: Height: 5' 4.5" (163.8 cm) Weight: 212 lb 15.4 oz (96.6 kg) IBW/kg (Calculated) : 55.85 Adjusted Body Weight:   Vital Signs: Temp: 97.8 F (36.6 C) (03/12 2253) Temp Source: Oral (03/12 1351) BP: 155/70 (03/12 2253) Pulse Rate: 87 (03/12 2253) Intake/Output from previous day: No intake/output data recorded. Intake/Output from this shift: No intake/output data recorded.  Labs: Recent Labs    12/28/19 1534  WBC 44.2*  HGB 13.7  HCT 42.6  PLT 266  CREATININE 1.18*  ALBUMIN 3.5  PROT 6.6  AST 30  ALT 7  ALKPHOS 117  BILITOT 2.0*   Estimated Creatinine Clearance: 43.3 mL/min (A) (by C-G formula based on SCr of 1.18 mg/dL (H)).   Microbiology: Recent Results (from the past 720 hour(s))  Respiratory Panel by RT PCR (Flu A&B, Covid) - Nasopharyngeal Swab     Status: None   Collection Time: 12/28/19  4:15 PM   Specimen: Nasopharyngeal Swab  Result Value Ref Range Status   SARS Coronavirus 2 by RT PCR NEGATIVE NEGATIVE Final    Comment: (NOTE) SARS-CoV-2 target nucleic acids are NOT DETECTED. The SARS-CoV-2 RNA is generally detectable in upper respiratoy specimens during the acute phase of infection. The  lowest concentration of SARS-CoV-2 viral copies this assay can detect is 131 copies/mL. A negative result does not preclude SARS-Cov-2 infection and should not be used as the sole basis for treatment or other patient management decisions. A negative result may occur with  improper specimen collection/handling, submission of specimen other than nasopharyngeal swab, presence of viral mutation(s) within the areas targeted by this assay, and inadequate number of viral copies (<131 copies/mL). A negative result must be combined with clinical observations, patient history, and epidemiological information. The expected result is Negative. Fact Sheet for Patients:  PinkCheek.be Fact Sheet for Healthcare Providers:  GravelBags.it This test is not yet ap proved or cleared by the [place] FDA and  has been authorized for detection and/or diagnosis of SARS-CoV-2 by FDA under an Emergency Use Authorization (EUA). This EUA will remain  in effect (meaning this test can be used) for the duration of the COVID-19 declaration under Section 564(b)(1) of the Act, 21 U.S.C. section 360bbb-3(b)(1), unless the authorization is terminated or revoked sooner.    Influenza A by PCR NEGATIVE NEGATIVE Final   Influenza B by PCR NEGATIVE NEGATIVE Final    Comment: (NOTE) The Xpert Xpress SARS-CoV-2/FLU/RSV assay is intended as an aid in  the diagnosis of influenza from Nasopharyngeal swab specimens and  should not be used as a sole basis for treatment. Nasal washings and  aspirates are unacceptable for Xpert Xpress SARS-CoV-2/FLU/RSV  testing. Fact Sheet for Patients: PinkCheek.be Fact Sheet for Healthcare Providers: GravelBags.it This test is not yet approved or cleared by  the [employer] and  has been authorized for detection and/or diagnosis of SARS-CoV-2 by  FDA under an Emergency  Use Authorization (EUA). This EUA will remain  in effect (meaning this test can be used) for the duration of the  Covid-19 declaration under Section 564(b)(1) of the Act, 21  U.S.C. section 360bbb-3(b)(1), unless the authorization is  terminated or revoked. Performed at [facility], [address] [place], [place] [address]     Medical History: Past Medical History:  Diagnosis Date  . Allergic rhinitis 01/23/2016  . C. difficile colitis   . CAD (coronary artery disease)    a. s/p STEMI in 01/2015 with 95% LCx stenosis and distal 80% LCx stenosis (DESx2 placed)  . Cancer ([place])    SKIN  . Candidiasis of skin 09/30/2014  . Cirrhosis ([place])   . Depression   . Diabetes mellitus 2008  . Gout   . Herpes simplex   . Hyperglycemia 05/31/2013  . Hyperlipidemia   . Hypertension   . Myocardial infarction ([place])   . Neuromuscular disorder ([place])    BELL PALSY  . Obesity   . Polycythemia    Dr. Elease Hashimoto- [facility]  . Psoriasis     Medications:  Medications Prior to Admission  Medication Sig Dispense Refill Last Dose  . acetaminophen (TYLENOL) 325 MG tablet Take 2 tablets (650 mg total) by mouth every 6 (six) hours as needed for mild pain (or Fever >/= 101).   Past Week at Unknown time  . amLODipine (NORVASC) 5 MG tablet Take 1 tablet (5 mg total) by mouth daily. 90 tablet 3 Past Week at Unknown time  . clopidogrel (PLAVIX) 75 MG tablet TAKE 1 TABLET(75 MG) BY MOUTH DAILY (Patient taking differently: Take 75 mg by mouth daily. ) 90 tablet 3 Past Week at Unknown time  . diazepam (VALIUM) 5 MG tablet Valium 5 mg by mouth at bedtime as needed. (Patient taking differently: Take 5 mg by mouth at bedtime as needed for anxiety. ) 3 tablet 0 Past Week at Unknown time  . Dulaglutide (TRULICITY [place]) Inject 1 pen into the skin once a week.   12/28/2019 at Unknown time  . ezetimibe (ZETIA) 10 MG tablet TAKE 1 TABLET(10 MG) BY MOUTH DAILY (Patient taking differently: Take 10 mg by  mouth daily. ) 90 tablet 0 Past Week at Unknown time  . fluconazole (DIFLUCAN) 150 MG tablet Take one tab weekly for two weeks (Patient taking differently: Take 150 mg by mouth See admin instructions. Take one tab weekly for two weeks prn) 2 tablet 0 Past Week at Unknown time  . furosemide (LASIX) 40 MG tablet TAKE 1 TABLET(40 MG) BY MOUTH DAILY (Patient taking differently: Take 40 mg by mouth daily. ) 90 tablet 2 Past Week at Unknown time  . insulin regular human CONCENTRATED (HUMULIN R U-500 KWIKPEN) 500 UNIT/ML kwikpen Inject 90 Units into the skin daily.   12/28/2019 at Unknown time  . JAKAFI 25 MG tablet TAKE 1 TABLET (25 MG TOTAL) BY MOUTH 2 TIMES DAILY. (Patient taking differently: Take 25 mg by mouth 2 (two) times daily. ) 60 tablet 6 Past Week at Unknown time  . latanoprost (XALATAN) 0.005 % ophthalmic solution Place 1 drop into both eyes at bedtime.    12/27/2019 at Unknown time  . lidocaine-prilocaine (EMLA) cream Apply 1 application topically as needed. Please apply EMLA over the Port-A-Cath site 1 hour prior to coming to our office. 30 g 5 Past Month at  Unknown time  . mirtazapine (REMERON) 15 MG tablet Take 1 tablet (15 mg total) by mouth at bedtime. (Patient taking differently: Take 30 mg by mouth at bedtime. ) 90 tablet 0 Past Week at Unknown time  . oxyCODONE-acetaminophen (PERCOCET/ROXICET) 5-325 MG tablet Take 1-2 tablets by mouth every 8 (eight) hours as needed for severe pain. 5 tablet 0 Past Week at Unknown time  . potassium chloride (KLOR-CON M10) 10 MEQ tablet Take 1 tablet (10 mEq total) by mouth 2 (two) times daily. (Patient taking differently: Take 10 mEq by mouth daily. ) 60 tablet 6 Past Week at Unknown time  . promethazine (PHENERGAN) 25 MG tablet Take 0.5 tablets (12.5 mg total) by mouth every 6 (six) hours as needed for nausea or vomiting. 30 tablet 1 Past Month at Unknown time  . timolol (TIMOPTIC) 0.5 % ophthalmic solution 1 drop daily.   12/28/2019 at Unknown time  .  triamcinolone cream (KENALOG) 0.1 % Apply 1 application topically 2 (two) times daily. 30 g 0 Past Week at Unknown time  . Blood Glucose Monitoring Suppl (ACCU-CHEK GUIDE) w/Device KIT 1 kit by Does not apply route as directed. 1 kit 0   . glucose blood (ACCU-CHEK GUIDE) test strip Use to check blood sugars 3 times daily 100 each 12   . Insulin Pen Needle (BD PEN NEEDLE NANO U/F) 32G X 4 MM MISC Use to inject insulin 2 times a day (Patient taking differently: 90 Units daily. ) 200 each 11   . ONETOUCH VERIO test strip USE TO TEST BLOOD SUGAR THREE TIMES DAILY 300 each 2   . predniSONE (DELTASONE) 50 MG tablet One tab PO daily for 5 days. (Patient not taking: Reported on 12/28/2019) 5 tablet 0 Not Taking at Unknown time   Scheduled:  . [START ON 12/29/2019] clopidogrel  75 mg Oral Daily  . dextrose      . [START ON 12/29/2019] insulin aspart  0-6 Units Subcutaneous TID WC  . latanoprost  1 drop Both Eyes QHS  . sodium chloride (PF)      . [START ON 12/29/2019] timolol  1 drop Both Eyes Daily    Assessment: Patient with ruxolitinib phosphate (JAKAFI) prior to admission for oral chemo.  Patient on IV antibiotics.  Ruxolitinib ([place]) hold criteria  ANC < 0.5  Pltc < 50K  CrCl < 60 mL/min AND Pltc < 100K  CrCl < 15 mL/min AND not on dialysis  Any LFT > ULN AND Pltc < 100K  Active infection   Goal of Therapy:  Safe and effective use of ruxolitinib phosphate (JAKAFI)   Plan:  Hold ruxolitinib phosphate (JAKAFI) at this time, due to active infection.  Nani Skillern Crowford 12/28/2019,11:53 PM

## 2019-12-28 NOTE — ED Notes (Signed)
[phone], Amanda [name], husband, please call when I can come be with my wife.

## 2019-12-28 NOTE — ED Provider Notes (Signed)
[facility] DEPT Provider Note   CSN: [id number] Arrival date & time: 12/28/19  1328     History Chief Complaint  Patient presents with  . Weakness  . Emesis  . Diarrhea   Amanda [name] is a 79 y.o. female.  Amanda [name] is a 79 year old female with a past medical history significant for cirrhosis, HTN, HLD, MI and polycythemia vera who presents today with 10 days of nausea and vomiting.  The patient states her symptoms came on without an identifiable trigger. She denies having any pain, but states that anytime she tries to eat or drink anything to come straight back up. She denies any blood in her vomit.  Also endorses some intermittent nonbloody diarrhea and describes it as "clear and liquidy". Denies any sick contacts.  Lives at home with her husband who is in good health.  Patient is alert and oriented x3. Denies any recent travel, fevers, headache, vision changes, numbness or tingling in any extremities, chest pain, SOB, abdominal pain, dysuria.    Patient endorses bilateral lower extremity weakness but does not know when it started.  Upon chart review, it appears that this is currently being worked up in outpatient setting.  Patient currently states that  Of note, the patient adamantly requested that she did not want to be put on machines or have chest compressions in the event of her decompensating.  Wants to be DNR.  I told her hopefully we do not get to that point, but her wishes will be respected.  According to the husband, the patient has been in bed for the last 10 days. The only times she gets out of bed is when husband takes her to the bathroom. From a cognitive standpoint, he says she is able to do complex tasks like pay bills, but can't appears to forget simple tasks like transferring from the wheelchair to the walker. Pt was fully vaccinated for COVID a month ago.      Past Medical History:  Diagnosis Date  . Allergic rhinitis 01/23/2016  . C.  difficile colitis   . CAD (coronary artery disease)    a. s/p STEMI in 01/2015 with 95% LCx stenosis and distal 80% LCx stenosis (DESx2 placed)  . Cancer ([place])    SKIN  . Candidiasis of skin 09/30/2014  . Cirrhosis ([place])   . Depression   . Diabetes mellitus 2008  . Gout   . Herpes simplex   . Hyperglycemia 05/31/2013  . Hyperlipidemia   . Hypertension   . Myocardial infarction ([place])   . Neuromuscular disorder ([place])    BELL PALSY  . Obesity   . Polycythemia    Dr. Elease Hashimoto- [facility]  . Psoriasis    Patient Active Problem List   Diagnosis Date Noted  . Worsening lower extremity weakness 12/14/2019  . Leg swelling 08/21/2019  . Hypotension due to hypovolemia 08/10/2019  . Acute renal failure superimposed on stage 3 chronic kidney disease ([place]) 08/10/2019  . Acute blood loss anemia 05/23/2018  . Dehydration 03/22/2018  . AKI (acute kidney injury) ([place]) 03/22/2018  . History of CVA (cerebrovascular accident) 03/22/2018  . Hip injury, left, subsequent encounter 11/08/2017  . Iron deficiency anemia 12/22/2016  . OSA (obstructive sleep apnea) 04/21/2016  . Osteopenia 04/07/2016  . Lung nodule, solitary 02/05/2016  . Aortic dilatation ([place]) 02/05/2016  . Dyspnea 01/28/2016  . Allergic rhinitis 01/23/2016  . Hepatic cirrhosis ([place]) 10/28/2015  . Irritable bowel syndrome 08/12/2015  . Obesity (BMI 30-39.9)  04/30/2015  . Diabetic retinopathy of both eyes ([place]) 04/18/2015  . Former smoker 04/18/2015  . GAD (generalized anxiety disorder) 04/18/2015  . Status post primary angioplasty with coronary stent 04/18/2015  . Coronary artery disease involving native coronary artery 03/01/2015  . Chronic diastolic CHF (congestive heart failure) ([place]) 02/01/2015  . ST elevation myocardial infarction (STEMI) involving left circumflex coronary artery in recovery phase ([place]) 01/27/2015  . Idioventricular rhythm ([place])   . Diabetic peripheral neuropathy associated with type 2 diabetes  mellitus ([place]) 01/23/2015  . Type 2 diabetes mellitus with diabetic polyneuropathy ([place]) 01/23/2015  . Thrombocytosis ([place]) 07/04/2014  . Cholelithiasis 02/07/2014  . Chronic diarrhea 01/29/2014  . [name] ([place]) 05/31/2013  . Essential hypertension 05/31/2013  . History of Bell's palsy 05/26/2013  . DERMATITIS, ATOPIC 11/16/2010  . DIZZINESS 11/16/2010  . DIASTOLIC DYSFUNCTION 15/17/6160  . Hyperlipidemia 12/15/2009  . Essential hypertension, benign 11/24/2009  . PSORIASIS 11/24/2009  . Depression 09/13/2009  . 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[place]) 07/08/2008   Past Surgical History:  Procedure Laterality Date  . ANKLE FRACTURE SURGERY    . BIOPSY  05/26/2018   Procedure: BIOPSY;  Surgeon: [name], MD;  Location: [facility] ENDOSCOPY;  Service: Endoscopy;;  . BREAST SURGERY Left    milk duct  . CATARACT EXTRACTION, BILATERAL    . COLONOSCOPY    . ESOPHAGOGASTRODUODENOSCOPY N/A 05/26/2018   Procedure: ESOPHAGOGASTRODUODENOSCOPY (EGD);  Surgeon: [name], MD;  Location: [facility] ENDOSCOPY;  Service: Endoscopy;  Laterality: N/A;  . IR IMAGING GUIDED PORT INSERTION  08/30/2019  . IR PARACENTESIS  05/25/2018  . LEFT HEART CATHETERIZATION WITH CORONARY ANGIOGRAM N/A 01/27/2015   Procedure: LEFT HEART CATHETERIZATION WITH CORONARY ANGIOGRAM;  Surgeon: [name], MD;  Location: [facility] CATH LAB;  Service: Cardiovascular;  Laterality: N/A;  . TONSILLECTOMY    . TOTAL ABDOMINAL HYSTERECTOMY W/ BILATERAL SALPINGOOPHORECTOMY     for heavy periods with appendectomy   OB History   No obstetric history on file.    Family History  Problem Relation Age of Onset  . Diabetes Mother   . Heart disease Mother        CAD  . Hyperlipidemia Mother   . Hypertension Mother   . Kidney disease Mother   . Kidney disease Father   . Breast cancer Maternal Aunt   . Heart disease Maternal Grandmother   . Heart disease Other        maternal aunts and uncles  . Colon cancer Neg Hx     . Esophageal cancer Neg Hx    Social History   Tobacco Use  . Smoking status: Former Smoker    Years: 48.00    Types: Cigarettes    Quit date: 10/18/1986    Years since quitting: 33.2  . Smokeless tobacco: Never Used  Substance Use Topics  . Alcohol use: No    Alcohol/week: 0.0 standard drinks  . Drug use: No   Home Medications Prior to Admission medications   Medication Sig Start Date End Date Taking? Authorizing Provider  acetaminophen (TYLENOL) 325 MG tablet Take 2 tablets (650 mg total) by mouth every 6 (six) hours as needed for mild pain (or Fever >/= 101). 05/27/18   Georgette Shell, MD  amLODipine (NORVASC) 5 MG tablet Take 1 tablet (5 mg total) by mouth daily. 09/19/19 12/18/19  [name], MD  Blood Glucose Monitoring Suppl (ACCU-CHEK GUIDE) w/Device KIT 1 kit by Does not apply route as directed. 01/25/18   Gherghe,  Salena Saner, MD  clopidogrel (PLAVIX) 75 MG tablet TAKE 1 TABLET(75 MG) BY MOUTH DAILY 11/27/19   Nafziger, Tommi Rumps, NP  diazepam (VALIUM) 5 MG tablet Valium 5 mg by mouth at bedtime as needed. 08/24/19   Volanda Napoleon, MD  ezetimibe (ZETIA) 10 MG tablet TAKE 1 TABLET(10 MG) BY MOUTH DAILY 11/07/19   [name], MD  fluconazole (DIFLUCAN) 150 MG tablet Take one tab weekly for two weeks 12/11/19   [name], NP  furosemide (LASIX) 40 MG tablet TAKE 1 TABLET(40 MG) BY MOUTH DAILY 10/16/19   [name], MD  glucose blood (ACCU-CHEK GUIDE) test strip Use to check blood sugars 3 times daily 01/25/18   [name], MD  insulin NPH Human (NOVOLIN N) 100 UNIT/ML injection Inject into the skin.    [provider]  Insulin Pen Needle (BD PEN NEEDLE NANO U/F) 32G X 4 MM MISC Use to inject insulin 2 times a day Patient taking differently: 90 Units daily.  09/07/19   [name], MD  JAKAFI 25 MG tablet TAKE 1 TABLET (25 MG TOTAL) BY MOUTH 2 TIMES DAILY. 09/05/19   Volanda Napoleon, MD  latanoprost (XALATAN) 0.005 % ophthalmic solution Place 1  drop into both eyes at bedtime.  03/23/19   [provider]  lidocaine-prilocaine (EMLA) cream Apply 1 application topically as needed. Please apply EMLA over the Port-A-Cath site 1 hour prior to coming to our office. 08/17/19   Volanda Napoleon, MD  mirtazapine (REMERON) 15 MG tablet Take 1 tablet (15 mg total) by mouth at bedtime. Patient taking differently: Take 30 mg by mouth at bedtime.  08/17/19   Nafziger, Tommi Rumps, NP  ONETOUCH VERIO test strip USE TO TEST BLOOD SUGAR THREE TIMES DAILY 05/01/18   [name], MD  oxyCODONE-acetaminophen (PERCOCET/ROXICET) 5-325 MG tablet Take 1-2 tablets by mouth every 8 (eight) hours as needed for severe pain. 09/28/19   Nafziger, Tommi Rumps, NP  potassium chloride (KLOR-CON M10) 10 MEQ tablet Take 1 tablet (10 mEq total) by mouth 2 (two) times daily. Patient taking differently: Take 10 mEq by mouth daily.  10/10/19   Milus Banister, MD  predniSONE (DELTASONE) 50 MG tablet One tab PO daily for 5 days. 12/14/19   Silverio Decamp, MD  promethazine (PHENERGAN) 25 MG tablet Take 0.5 tablets (12.5 mg total) by mouth every 6 (six) hours as needed for nausea or vomiting. 05/28/18   Emokpae, Ejiroghene E, MD  timolol (TIMOPTIC) 0.5 % ophthalmic solution 1 drop daily. 10/07/19   [provider]  triamcinolone cream (KENALOG) 0.1 % Apply 1 application topically 2 (two) times daily. 12/11/19   Nafziger, Tommi Rumps, NP   Allergies    Penicillins, Lisinopril, Tape, Doxycycline, and Latex  Review of Systems   Review of Systems  Constitutional: Positive for appetite change and fatigue. Negative for chills, diaphoresis and fever.  HENT: Negative.   Eyes: Negative.   Respiratory: Positive for shortness of breath. Negative for cough.   Cardiovascular: Negative.   Gastrointestinal: Positive for abdominal distention, diarrhea and nausea. Negative for abdominal pain, anal bleeding, blood in stool, constipation, rectal pain and vomiting.  Endocrine: Negative.     Genitourinary: Negative.   Musculoskeletal: Negative.   Skin: Negative.   Allergic/Immunologic: Negative.   Neurological: Negative.   Hematological: Negative.   Psychiatric/Behavioral: Negative.    Physical Exam Updated Vital Signs BP (!) 147/96   Pulse (!) 105   Temp 98.6 F (37 C) (Oral)   Resp (!) 25  SpO2 98%   Physical Exam Vitals reviewed.  Constitutional:      General: She is not in acute distress.    Appearance: She is obese. She is ill-appearing. She is not toxic-appearing or diaphoretic.  HENT:     Head: Normocephalic and atraumatic.     Nose:     Comments: [place] in place    Mouth/Throat:     Pharynx: No posterior oropharyngeal erythema.  Eyes:     General: No scleral icterus.       Right eye: No discharge.        Left eye: No discharge.     Extraocular Movements: Extraocular movements intact.     Pupils: Pupils are equal, round, and reactive to light.  Cardiovascular:     Rate and Rhythm: Normal rate and regular rhythm.     Pulses: Normal pulses.     Heart sounds: Normal heart sounds. No murmur. No friction rub. No gallop.   Pulmonary:     Effort: Pulmonary effort is normal. No respiratory distress.     Breath sounds: Normal breath sounds. No wheezing.  Abdominal:     General: Bowel sounds are normal. There is distension.     Palpations: Abdomen is soft. There is no mass.     Tenderness: There is no abdominal tenderness. There is no guarding or rebound.  Musculoskeletal:        General: Normal range of motion.     Right lower leg: No edema.     Left lower leg: No edema.  Skin:    General: Skin is warm and dry.     Coloration: Skin is not jaundiced.     Findings: Bruising present.  Neurological:     General: No focal deficit present.     Mental Status: She is alert and oriented to person, place, and time.     Motor: Weakness (4/5 strength with hip flexion and extension. 5/5 throughout elsewhere) present.  Psychiatric:        Mood and Affect: Mood  normal.    ED Results / Procedures / Treatments   Labs (all labs ordered are listed, but only abnormal results are displayed) Labs Reviewed  CBC WITH DIFFERENTIAL/PLATELET  COMPREHENSIVE METABOLIC PANEL  LIPASE, BLOOD  URINALYSIS, ROUTINE W REFLEX MICROSCOPIC  AMMONIA  TROPONIN I (HIGH SENSITIVITY)   EKG EKG Interpretation  Date/Time:  Friday December 28 2019 14:29:55 EST Ventricular Rate:  102 PR Interval:    QRS Duration: 99 QT Interval:  342 QTC Calculation: 446 R Axis:   -42 Text Interpretation: Sinus tachycardia Ventricular premature complex Abnormal R-wave progression, early transition Inferior infarct, old Consider anterior infarct No significant change since last tracing Confirmed by Wandra Arthurs [phone]) on 12/28/2019 2:37:10 PM  Radiology No results found.  Procedures Procedures (including critical care time)  Medications Ordered in ED Medications  sodium chloride 0.9 % bolus 1,000 mL (has no administration in time range)   ED Course  I have reviewed the triage vital signs and the nursing notes.  Pertinent labs & imaging results that were available during my care of the patient were reviewed by me and considered in my medical decision making (see chart for details).    MDM Rules/Calculators/A&P                     Appears the patient has had a chronic decline in her health over the last couple months.  Husband states her functional status is declining, she appears  a little more confused than usual, and is getting more difficult to care for at home.  Currently, the patient appears chronically ill and has some ascites noted on bedside ultrasound.  Will obtain CT abdomen to further investigate.  Low concern for SBP, as the patient is nontender on exam and her vitals are stable.  Will order labs as well.  Leukocytosis to 44.8 on CBC.  Will start sepsis protocol.  Obtain blood cultures, trend lactates and start vancomycin, cefepime and Flagyl.  Went to the room to update  the patient and husband.  They are aware of the plan and on board.  Will likely need hospital admission.   Final Clinical Impression(s) / ED Diagnoses Final diagnoses:  None   Rx / DC Orders ED Discharge Orders    None       Earlene Plater, MD 01/01/20 2327    Drenda Freeze, MD 01/02/20 1036

## 2019-12-29 ENCOUNTER — Inpatient Hospital Stay (HOSPITAL_COMMUNITY): Payer: Medicare HMO

## 2019-12-29 ENCOUNTER — Other Ambulatory Visit: Payer: Self-pay

## 2019-12-29 DIAGNOSIS — R935 Abnormal findings on diagnostic imaging of other abdominal regions, including retroperitoneum: Secondary | ICD-10-CM

## 2019-12-29 DIAGNOSIS — K7581 Nonalcoholic steatohepatitis (NASH): Secondary | ICD-10-CM

## 2019-12-29 DIAGNOSIS — K5100 Ulcerative (chronic) pancolitis without complications: Principal | ICD-10-CM

## 2019-12-29 DIAGNOSIS — N183 Chronic kidney disease, stage 3 unspecified: Secondary | ICD-10-CM

## 2019-12-29 DIAGNOSIS — E876 Hypokalemia: Secondary | ICD-10-CM

## 2019-12-29 DIAGNOSIS — R531 Weakness: Secondary | ICD-10-CM

## 2019-12-29 DIAGNOSIS — G9341 Metabolic encephalopathy: Secondary | ICD-10-CM

## 2019-12-29 DIAGNOSIS — K821 Hydrops of gallbladder: Secondary | ICD-10-CM

## 2019-12-29 DIAGNOSIS — I81 Portal vein thrombosis: Secondary | ICD-10-CM

## 2019-12-29 DIAGNOSIS — E119 Type 2 diabetes mellitus without complications: Secondary | ICD-10-CM

## 2019-12-29 DIAGNOSIS — K7689 Other specified diseases of liver: Secondary | ICD-10-CM

## 2019-12-29 LAB — CBC WITH DIFFERENTIAL/PLATELET
Abs Immature Granulocytes: 1.88 10*3/uL — ABNORMAL HIGH (ref 0.00–0.07)
Basophils Absolute: 0.2 10*3/uL — ABNORMAL HIGH (ref 0.0–0.1)
Basophils Relative: 1 %
Eosinophils Absolute: 0 10*3/uL (ref 0.0–0.5)
Eosinophils Relative: 0 %
HCT: 33.1 % — ABNORMAL LOW (ref 36.0–46.0)
Hemoglobin: 10.5 g/dL — ABNORMAL LOW (ref 12.0–15.0)
Immature Granulocytes: 7 %
Lymphocytes Relative: 6 %
Lymphs Abs: 1.5 10*3/uL (ref 0.7–4.0)
MCH: 29.3 pg (ref 26.0–34.0)
MCHC: 31.7 g/dL (ref 30.0–36.0)
MCV: 92.5 fL (ref 80.0–100.0)
Monocytes Absolute: 1.4 10*3/uL — ABNORMAL HIGH (ref 0.1–1.0)
Monocytes Relative: 5 %
Neutro Abs: 22.2 10*3/uL — ABNORMAL HIGH (ref 1.7–7.7)
Neutrophils Relative %: 81 %
Platelets: 161 10*3/uL (ref 150–400)
RBC: 3.58 MIL/uL — ABNORMAL LOW (ref 3.87–5.11)
RDW: 19.9 % — ABNORMAL HIGH (ref 11.5–15.5)
WBC: 27.2 10*3/uL — ABNORMAL HIGH (ref 4.0–10.5)
nRBC: 0.3 % — ABNORMAL HIGH (ref 0.0–0.2)

## 2019-12-29 LAB — HEPATIC FUNCTION PANEL
ALT: 6 U/L (ref 0–44)
AST: 21 U/L (ref 15–41)
Albumin: 2.6 g/dL — ABNORMAL LOW (ref 3.5–5.0)
Alkaline Phosphatase: 84 U/L (ref 38–126)
Bilirubin, Direct: 0.7 mg/dL — ABNORMAL HIGH (ref 0.0–0.2)
Indirect Bilirubin: 1 mg/dL — ABNORMAL HIGH (ref 0.3–0.9)
Total Bilirubin: 1.7 mg/dL — ABNORMAL HIGH (ref 0.3–1.2)
Total Protein: 5 g/dL — ABNORMAL LOW (ref 6.5–8.1)

## 2019-12-29 LAB — BASIC METABOLIC PANEL
Anion gap: 10 (ref 5–15)
BUN: 9 mg/dL (ref 8–23)
CO2: 24 mmol/L (ref 22–32)
Calcium: 7.9 mg/dL — ABNORMAL LOW (ref 8.9–10.3)
Chloride: 103 mmol/L (ref 98–111)
Creatinine, Ser: 1.11 mg/dL — ABNORMAL HIGH (ref 0.44–1.00)
GFR calc Af Amer: 54 mL/min — ABNORMAL LOW (ref >60)
GFR calc non Af Amer: 47 mL/min — ABNORMAL LOW (ref >60)
Glucose, Bld: 99 mg/dL (ref 70–99)
Potassium: 2.5 mmol/L — CL (ref 3.5–5.1)
Sodium: 137 mmol/L (ref 135–145)

## 2019-12-29 LAB — GLUCOSE, CAPILLARY
Glucose-Capillary: 112 mg/dL — ABNORMAL HIGH (ref 70–99)
Glucose-Capillary: 126 mg/dL — ABNORMAL HIGH (ref 70–99)
Glucose-Capillary: 115 mg/dL — ABNORMAL HIGH (ref 70–99)
Glucose-Capillary: 130 mg/dL — ABNORMAL HIGH (ref 70–99)

## 2019-12-29 LAB — HEPARIN LEVEL (UNFRACTIONATED)
Heparin Unfractionated: 0.44 IU/mL (ref 0.30–0.70)
Heparin Unfractionated: 0.33 IU/mL (ref 0.30–0.70)

## 2019-12-29 LAB — HEMOGLOBIN A1C
Hgb A1c MFr Bld: 5.7 % — ABNORMAL HIGH (ref 4.8–5.6)
Mean Plasma Glucose: 116.89 mg/dL

## 2019-12-29 MED ORDER — CHLORHEXIDINE GLUCONATE CLOTH 2 % EX PADS
6.0000 | MEDICATED_PAD | Freq: Every day | CUTANEOUS | Status: DC
  Administered 2019-12-29 – 2020-01-02 (×5): 6 via TOPICAL

## 2019-12-29 MED ORDER — POTASSIUM CHLORIDE 10 MEQ/100ML IV SOLN
10.0000 meq | INTRAVENOUS | Status: AC
  Administered 2019-12-29 (×3): 10 meq via INTRAVENOUS
  Filled 2019-12-29 (×4): qty 100

## 2019-12-29 MED ORDER — SODIUM CHLORIDE 0.9% FLUSH
10.0000 mL | INTRAVENOUS | Status: DC | PRN
  Administered 2019-12-29 – 2020-01-02 (×2): 10 mL

## 2019-12-29 MED ORDER — VANCOMYCIN HCL 750 MG/150ML IV SOLN
750.0000 mg | INTRAVENOUS | Status: DC
  Administered 2019-12-30: 750 mg via INTRAVENOUS
  Filled 2019-12-29: qty 150

## 2019-12-29 MED ORDER — POTASSIUM CHLORIDE 10 MEQ/100ML IV SOLN
10.0000 meq | INTRAVENOUS | Status: AC
  Administered 2019-12-29 (×4): 10 meq via INTRAVENOUS
  Filled 2019-12-29 (×3): qty 100

## 2019-12-29 MED ORDER — LACTULOSE 10 GM/15ML PO SOLN
20.0000 g | Freq: Three times a day (TID) | ORAL | Status: DC
  Administered 2019-12-30: 20 g via ORAL
  Filled 2019-12-29 (×2): qty 30

## 2019-12-29 MED ORDER — AMLODIPINE BESYLATE 5 MG PO TABS
5.0000 mg | ORAL_TABLET | Freq: Every day | ORAL | Status: DC
  Administered 2019-12-30 – 2020-01-02 (×4): 5 mg via ORAL
  Filled 2019-12-29 (×5): qty 1

## 2019-12-29 MED ORDER — ONDANSETRON HCL 4 MG/2ML IJ SOLN
4.0000 mg | Freq: Four times a day (QID) | INTRAMUSCULAR | Status: DC
  Administered 2019-12-29 – 2020-01-02 (×16): 4 mg via INTRAVENOUS
  Filled 2019-12-29 (×17): qty 2

## 2019-12-29 MED ORDER — POTASSIUM CHLORIDE 10 MEQ/100ML IV SOLN
10.0000 meq | Freq: Once | INTRAVENOUS | Status: AC
  Administered 2019-12-29: 10 meq via INTRAVENOUS
  Filled 2019-12-29: qty 100

## 2019-12-29 MED ORDER — METOCLOPRAMIDE HCL 5 MG/ML IJ SOLN
10.0000 mg | Freq: Four times a day (QID) | INTRAMUSCULAR | Status: DC | PRN
  Administered 2019-12-29 – 2019-12-30 (×2): 10 mg via INTRAVENOUS
  Filled 2019-12-29 (×3): qty 2

## 2019-12-29 MED ORDER — SODIUM CHLORIDE 0.9% FLUSH
10.0000 mL | Freq: Two times a day (BID) | INTRAVENOUS | Status: DC
  Administered 2020-01-01: 10 mL

## 2019-12-29 NOTE — Progress Notes (Signed)
PT Cancellation Note  Patient Details Name: Amanda [name] MRN: [id number] DOB: [birth year]   Cancelled Treatment:    Reason Eval/Treat Not Completed: Patient declined, no reason specified;Medical issues which prohibited therapy. Pt with critically low Potassium value, but started medication so offered PT evaluation. Pt declines PT eval, reports being "too tired" and "nauseas". Will continue to offer eval.   Talbot Grumbling PT, DPT 12/29/19, 11:10 AM [phone]

## 2019-12-29 NOTE — Consult Note (Addendum)
Referring Provider:  Dr. Louanne Belton, [facility] Primary Care Physician:  [name], NP Primary Gastroenterologist:  Dr. [name]  Reason for Consultation:  Cirrhosis, liver mass/lesion  HPI: Amanda [name] is a 79 y.o. female withhistory of CAD status post stenting on Plavix, hypertension, diabetes mellitus type 2, polycythemia vera, cirrhosis of the liver.  Brought to the ED with complaints of increasing weakness over the last 1 month and has progressed to become bedbound over the last week. Patient over the last 10 days has been having increasing nausea, vomiting, and diarrhea with abdominal pain.  Denies any blood in the vomitus. Has had multiple episodes of vomiting or diarrhea.  Denies any fever or chills.  Given the symptoms and increasing weakness patient was brought to the ER. Patient's husband states that over the last 1 week patient has not taken any of her medications because of the vomiting.  Upon evaluation in the ED WBC count was 44K.  K+ 2.8.  CT scan of the abdomen and pelvis with contrast showed the following:  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 Small focal nonocclusive thrombus in the intrahepatic inferior vena cava. These may be secondary to the patient's cirrhosis, however, correlation with lab values including an alpha fetoprotein marker may be useful.  Partial characterization of heterogeneous parenchyma in the right hepatic dome concerning for a possible hepatocellular carcinoma. Dedicated MR or CT liver protocol imaging without and with intravenous contrast was recommended.  Probable recent viral infection with mild bronchitis and trace pleural effusion in the left lower lobe, a mild uncomplicated pancolitis, and  gallbladder hydrops, a 5.2 cm gallbladder dilatation with dense sludge or tiny stones layering at the gallbladder neck, which could partially obstruct the cystic duct. If there is concern for acute cholecystitis or cystic duct obstruction, hepatobiliary scintigraphy could be considered.  Abnormally thickened endometrial stripe for an 79 year old female. Recommend further gynecological consultation and nonemergent pelvis ultrasonography.  A nonspecific abnormally enlarged periportal lymph node and abnormally enlarged retroperitoneal lymph node with clustered smaller nodes, potentially reactive or metastatic.  Punctate nonobstructing right renal calculus.  Four-vessel coronary calcification.  A 25 mm subdermal cyst in the right lateral hip, possibly a sebaceous cyst. No associated inflammatory change.  Thoracoabdominal aortic calcified atherosclerosis.  (ICD10-I70.0).  Husband present at bedside during my interview.  Patient complains mostly of ongoing nausea today.   AFP and ultrasound liver ok in July 2020.    EGD 05/2018, Dr. Lyndel Safe while hospitalized, gastritis, mild duodenitis. No portal hypertensive signs.   Past Medical History:  Diagnosis Date  . Allergic rhinitis 01/23/2016  . C. difficile colitis   . CAD (coronary artery disease)    a. s/p STEMI in 01/2015 with 95% LCx stenosis and distal 80% LCx stenosis (DESx2 placed)  . Cancer ([place])    SKIN  . Candidiasis of skin 09/30/2014  . Cirrhosis ([place])   . Depression   . Diabetes mellitus 2008  . Gout   . Herpes simplex   . Hyperglycemia 05/31/2013  . Hyperlipidemia   . Hypertension   . Myocardial infarction ([place])   . Neuromuscular disorder ([place])    BELL PALSY  . Obesity   . Polycythemia    Dr. Elease Hashimoto- [facility]  . Psoriasis     Past Surgical History:  Procedure Laterality Date  . ANKLE FRACTURE SURGERY    . BIOPSY  05/26/2018   Procedure: BIOPSY;  Surgeon: [name], MD;  Location: [facility] ENDOSCOPY;   Service: Endoscopy;;  . BREAST SURGERY Left    milk duct  . CATARACT EXTRACTION, BILATERAL    . COLONOSCOPY    . ESOPHAGOGASTRODUODENOSCOPY N/A 05/26/2018   Procedure: ESOPHAGOGASTRODUODENOSCOPY (EGD);  Surgeon: [name], MD;  Location: [facility] ENDOSCOPY;  Service: Endoscopy;  Laterality: N/A;  . IR IMAGING GUIDED PORT INSERTION  08/30/2019  . IR PARACENTESIS  05/25/2018  . LEFT HEART CATHETERIZATION WITH CORONARY ANGIOGRAM N/A 01/27/2015   Procedure: LEFT HEART CATHETERIZATION WITH CORONARY ANGIOGRAM;  Surgeon: [name], MD;  Location: [facility] CATH LAB;  Service: Cardiovascular;  Laterality: N/A;  . TONSILLECTOMY    . TOTAL ABDOMINAL HYSTERECTOMY W/ BILATERAL SALPINGOOPHORECTOMY     for heavy periods with appendectomy    Prior to Admission medications   Medication Sig Start Date End Date Taking? Authorizing Provider  acetaminophen (TYLENOL) 325 MG tablet Take 2 tablets (650 mg total) by mouth every 6 (six) hours as needed for mild pain (or Fever >/= 101). 05/27/18  Yes Georgette Shell, MD  amLODipine (NORVASC) 5 MG tablet Take 1 tablet (5 mg total) by mouth daily. 09/19/19 12/28/19 Yes [name], MD  clopidogrel (PLAVIX) 75 MG tablet TAKE 1 TABLET(75 MG) BY MOUTH DAILY Patient taking differently: Take 75 mg by mouth daily.  11/27/19  Yes Nafziger, Tommi Rumps, NP  diazepam (VALIUM) 5 MG tablet Valium 5 mg by mouth at bedtime as needed. Patient taking differently: Take 5 mg by mouth at bedtime as needed for anxiety.  08/24/19  Yes Ennever, Rudell Cobb, MD  Dulaglutide (TRULICITY [place]) Inject 1 pen into the skin once a week.   Yes [provider]  ezetimibe (ZETIA) 10 MG tablet TAKE 1 TABLET(10 MG) BY MOUTH DAILY Patient taking differently: Take 10 mg by mouth daily.  11/07/19  Yes [name], MD  fluconazole (DIFLUCAN) 150 MG tablet Take one tab weekly for two weeks Patient taking differently: Take 150 mg by mouth See admin instructions. Take one tab weekly for two weeks prn 12/11/19  Yes  Nafziger, Tommi Rumps, NP  furosemide (LASIX) 40 MG tablet TAKE 1 TABLET(40 MG) BY MOUTH DAILY Patient taking differently: Take 40 mg by mouth daily.  10/16/19  Yes [name], MD  insulin regular human CONCENTRATED (HUMULIN R U-500 KWIKPEN) 500 UNIT/ML kwikpen Inject 90 Units into the skin daily.   Yes [provider]  JAKAFI 25 MG tablet TAKE 1 TABLET (25 MG TOTAL) BY MOUTH 2 TIMES DAILY. Patient taking differently: Take 25 mg by mouth 2 (two) times daily.  09/05/19  Yes Ennever, Rudell Cobb, MD  latanoprost (XALATAN) 0.005 % ophthalmic solution Place 1 drop into both eyes at bedtime.  03/23/19  Yes [provider]  lidocaine-prilocaine (EMLA) cream Apply 1 application topically as needed. Please apply EMLA over the Port-A-Cath site 1 hour prior to coming to our office. 08/17/19  Yes Volanda Napoleon, MD  mirtazapine (REMERON) 15 MG tablet Take 1 tablet (15 mg total) by mouth at bedtime. Patient taking differently: Take 30 mg by mouth at bedtime.  08/17/19  Yes Nafziger, Tommi Rumps, NP  oxyCODONE-acetaminophen (PERCOCET/ROXICET) 5-325 MG tablet Take 1-2 tablets by mouth every 8 (eight) hours as needed for severe pain. 09/28/19  Yes Nafziger, Tommi Rumps, NP  potassium chloride (KLOR-CON M10) 10 MEQ tablet Take 1 tablet (10 mEq total) by mouth  2 (two) times daily. Patient taking differently: Take 10 mEq by mouth daily.  10/10/19  Yes Milus Banister, MD  promethazine (PHENERGAN) 25 MG tablet Take 0.5 tablets (12.5 mg total) by mouth every 6 (six) hours as needed for nausea or vomiting. 05/28/18  Yes Emokpae, Ejiroghene E, MD  timolol (TIMOPTIC) 0.5 % ophthalmic solution 1 drop daily. 10/07/19  Yes [provider]  triamcinolone cream (KENALOG) 0.1 % Apply 1 application topically 2 (two) times daily. 12/11/19  Yes Nafziger, Tommi Rumps, NP  Blood Glucose Monitoring Suppl (ACCU-CHEK GUIDE) w/Device KIT 1 kit by Does not apply route as directed. 01/25/18   [name], MD  glucose blood (ACCU-CHEK  GUIDE) test strip Use to check blood sugars 3 times daily 01/25/18   [name], MD  Insulin Pen Needle (BD PEN NEEDLE NANO U/F) 32G X 4 MM MISC Use to inject insulin 2 times a day Patient taking differently: 90 Units daily.  09/07/19   [name], MD  ONETOUCH VERIO test strip USE TO TEST BLOOD SUGAR THREE TIMES DAILY 05/01/18   [name], MD  predniSONE (DELTASONE) 50 MG tablet One tab PO daily for 5 days. Patient not taking: Reported on 12/28/2019 12/14/19   Silverio Decamp, MD    Current Facility-Administered Medications  Medication Dose Route Frequency Provider Last Rate Last Admin  . 0.9 %  sodium chloride infusion   Intravenous Continuous Rise Patience, MD 100 mL/hr at 12/28/19 2319 New Bag at 12/28/19 2319  . acetaminophen (TYLENOL) tablet 650 mg  650 mg Oral Q6H PRN Rise Patience, MD       Or  . acetaminophen (TYLENOL) suppository 650 mg  650 mg Rectal Q6H PRN Rise Patience, MD      . amLODipine (NORVASC) tablet 5 mg  5 mg Oral Daily Rise Patience, MD      . ceFEPIme (MAXIPIME) 2 g in sodium chloride 0.9 % 100 mL IVPB  2 g Intravenous Q12H Rise Patience, MD 200 mL/hr at 12/29/19 0616 2 g at 12/29/19 0616  . Chlorhexidine Gluconate Cloth 2 % PADS 6 each  6 each Topical Daily Rise Patience, MD      . clopidogrel (PLAVIX) tablet 75 mg  75 mg Oral Daily Rise Patience, MD      . dextrose 50 % solution           . heparin ADULT infusion 100 units/mL (25000 units/236m sodium chloride 0.45%)  1,100 Units/hr Intravenous Continuous KRise Patience MD 11 mL/hr at 12/28/19 2316 1,100 Units/hr at 12/28/19 2316  . hydrALAZINE (APRESOLINE) injection 10 mg  10 mg Intravenous Q4H PRN KRise Patience MD      . insulin aspart (novoLOG) injection 0-6 Units  0-6 Units Subcutaneous TID WC KRise Patience MD      . lactulose (CHRONULAC) 10 GM/15ML solution 20 g  20 g Oral TID KRise Patience MD      .  latanoprost (XALATAN) 0.005 % ophthalmic solution 1 drop  1 drop Both Eyes QHS KRise Patience MD   1 drop at 12/29/19 0011  . metroNIDAZOLE (FLAGYL) IVPB 500 mg  500 mg Intravenous Q8H KRise Patience MD 100 mL/hr at 12/29/19 0500 500 mg at 12/29/19 0500  . ondansetron (ZOFRAN) tablet 4 mg  4 mg Oral Q6H PRN KRise Patience MD       Or  . ondansetron ([facility] injection 4 mg  4 mg Intravenous Q6H PRN KHal Hope  Doreatha Lew, MD   4 mg at 12/29/19 0906  . potassium chloride 10 mEq in 100 mL IVPB  10 mEq Intravenous Q1 Hr x 4 Bunnie Pion Z, DO 100 mL/hr at 12/29/19 0847 10 mEq at 12/29/19 0847  . sodium chloride flush (NS) 0.9 % injection 10-40 mL  10-40 mL Intracatheter Q12H Rise Patience, MD      . sodium chloride flush (NS) 0.9 % injection 10-40 mL  10-40 mL Intracatheter PRN Rise Patience, MD   10 mL at 12/29/19 0426  . timolol (TIMOPTIC) 0.5 % ophthalmic solution 1 drop  1 drop Both Eyes Daily Rise Patience, MD      . vancomycin (VANCOREADY) IVPB 750 mg/150 mL  750 mg Intravenous Q24H Rise Patience, MD        Allergies as of 12/28/2019 - Review Complete 12/28/2019  Allergen Reaction Noted  . Penicillins Swelling 05/23/2018  . Lisinopril Cough 04/08/2015  . Tape Hives 01/28/2014  . Doxycycline Hives, Swelling, and Rash   . Latex Hives, Itching, and Rash 09/07/2011    Family History  Problem Relation Age of Onset  . Diabetes Mother   . Heart disease Mother        CAD  . Hyperlipidemia Mother   . Hypertension Mother   . Kidney disease Mother   . Kidney disease Father   . Breast cancer Maternal Aunt   . Heart disease Maternal Grandmother   . Heart disease Other        maternal aunts and uncles  . Colon cancer Neg Hx   . Esophageal cancer Neg Hx     Social History   Socioeconomic History  . Marital status: Married    Spouse name: [name]  . Number of children: 2  . Years of education: 2 yr colle  . Highest education level: Not on  file  Occupational History  . Occupation: housewife  Tobacco Use  . Smoking status: Former Smoker    Years: 48.00    Types: Cigarettes    Quit date: 10/18/1986    Years since quitting: 33.2  . Smokeless tobacco: Never Used  Substance and Sexual Activity  . Alcohol use: No    Alcohol/week: 0.0 standard drinks  . Drug use: No  . Sexual activity: Yes    Partners: Male    Birth control/protection: None  Other Topics Concern  . Not on file  Social History Narrative   2 caffeine drink per day.  No regular exercise.     Retired from the bank   2 children (Daughter and a son) son has morbid obesity   2 grandchildren   3 great grandchildren   Social Determinants of Radio broadcast assistant Strain:   . Difficulty of Paying Living Expenses:   Food Insecurity:   . Worried About Charity fundraiser in the Last Year:   . Arboriculturist in the Last Year:   Transportation Needs:   . Film/video editor (Medical):   Marland Kitchen Lack of Transportation (Non-Medical):   Physical Activity:   . Days of Exercise per Week:   . Minutes of Exercise per Session:   Stress:   . Feeling of Stress :   Social Connections:   . Frequency of Communication with Friends and Family:   . Frequency of Social Gatherings with Friends and Family:   . Attends Religious Services:   . Active Member of Clubs or Organizations:   . Attends Archivist  Meetings:   Marland Kitchen Marital Status:   Intimate Partner Violence:   . Fear of Current or Ex-Partner:   . Emotionally Abused:   Marland Kitchen Physically Abused:   . Sexually Abused:     Review of Systems: ROS is O/W negative except as mentioned in HPI.  Physical Exam: Vital signs in last 24 hours: Temp:  [97.8 F (36.6 C)-98.6 F (37 C)] 98.5 F (36.9 C) (03/13 0504) Pulse Rate:  [59-105] 91 (03/13 0504) Resp:  [14-25] 14 (03/13 0504) BP: (123-162)/(48-96) 131/49 (03/13 0504) SpO2:  [89 %-99 %] 97 % (03/13 0504) Weight:  [96.6 kg] 96.6 kg (03/12 2253) Last BM Date:  12/28/19 General:  Alert, Well-developed, well-nourished, pleasant and cooperative in NAD Head:  Normocephalic and atraumatic. Eyes:  Sclera clear, no icterus.  Conjunctiva pink. Ears:  Normal auditory acuity. Mouth:  No deformity or lesions.   Lungs:  Clear throughout to auscultation.  No wheezes, crackles, or rhonchi.  Heart:  Regular rate and rhythm; no murmurs, clicks, rubs, or gallops. Abdomen:  Soft, obese, non-distended.  BS present.  Non-tender.   Msk:  Symmetrical without gross deformities. Pulses:  Normal pulses noted. Extremities:  Without clubbing or edema. Neurologic:  Alert and oriented x 4;  grossly normal neurologically. Skin:  Intact without significant lesions or rashes. Psych:  Alert and cooperative. Normal mood and affect.  Intake/Output from previous day: 03/12 0701 - 03/13 0700 In: 1031.3 [P.O.:360; I.V.:371.3; IV Piggyback:300] Out: 300 [Urine:300]  Lab Results: Recent Labs    12/28/19 1534 12/29/19 0425  WBC 44.2* 27.2*  HGB 13.7 10.5*  HCT 42.6 33.1*  PLT 266 161   BMET Recent Labs    12/28/19 1534 12/29/19 0425  NA 137 137  K 2.8* 2.5*  CL 99 103  CO2 24 24  GLUCOSE 67* 99  BUN 9 9  CREATININE 1.18* 1.11*  CALCIUM 8.9 7.9*   LFT Recent Labs    12/29/19 0425  PROT 5.0*  ALBUMIN 2.6*  AST 21  ALT 6  ALKPHOS 84  BILITOT 1.7*  BILIDIR 0.7*  IBILI 1.0*   Studies/Results: CT HEAD WO CONTRAST  Result Date: 12/29/2019 CLINICAL DATA:  79 year old female with altered mental status, generalized weakness and neck pain. EXAM: CT HEAD WITHOUT CONTRAST CT CERVICAL SPINE WITHOUT CONTRAST TECHNIQUE: Multidetector CT imaging of the head and cervical spine was performed following the standard protocol without intravenous contrast. Multiplanar CT image reconstructions of the cervical spine were also generated. COMPARISON:  03/22/2018 brain MR, 01/29/2014 head CT, 03/25/2018 neck CT and other studies. FINDINGS: CT HEAD FINDINGS Brain: No evidence of  acute infarction, hemorrhage, hydrocephalus, extra-axial collection or mass lesion/mass effect. Atrophy and chronic small-vessel white matter ischemic changes again noted. Vascular: Carotid and vertebral atherosclerotic calcifications are noted. Skull: Normal. Negative for fracture or focal lesion. Sinuses/Orbits: No acute abnormality. Chronic RIGHT mastoid effusion is again identified. Other: None CT CERVICAL SPINE FINDINGS Alignment: Normal. Skull base and vertebrae: No acute fracture. No primary bone lesion or focal pathologic process. Soft tissues and spinal canal: No prevertebral fluid or swelling. No visible canal hematoma. Disc levels: Unchanged mild to moderate degenerative disc disease/spondylosis and mild facet arthropathy within the cervical spine. These findings contribute to the following: Mild central spinal narrowing at C3-4 Mild central spinal narrowing at C4-5 Moderate central spinal and biforaminal narrowing at C5-6. Upper chest: Negative. Other: None IMPRESSION: 1. No evidence of acute intracranial abnormality. Atrophy and chronic small-vessel white matter ischemic changes. 2. No static evidence of acute  injury to the cervical spine. 3. Degenerative changes as described within significant change since 03/25/2018. Moderate central spinal and biforaminal narrowing at C5-6. 4. Chronic RIGHT mastoid effusion Electronically Signed   By: Margarette Canada M.D.   On: 12/29/2019 10:17   CT CERVICAL SPINE WO CONTRAST  Result Date: 12/29/2019 CLINICAL DATA:  79 year old female with altered mental status, generalized weakness and neck pain. EXAM: CT HEAD WITHOUT CONTRAST CT CERVICAL SPINE WITHOUT CONTRAST TECHNIQUE: Multidetector CT imaging of the head and cervical spine was performed following the standard protocol without intravenous contrast. Multiplanar CT image reconstructions of the cervical spine were also generated. COMPARISON:  03/22/2018 brain MR, 01/29/2014 head CT, 03/25/2018 neck CT and other  studies. FINDINGS: CT HEAD FINDINGS Brain: No evidence of acute infarction, hemorrhage, hydrocephalus, extra-axial collection or mass lesion/mass effect. Atrophy and chronic small-vessel white matter ischemic changes again noted. Vascular: Carotid and vertebral atherosclerotic calcifications are noted. Skull: Normal. Negative for fracture or focal lesion. Sinuses/Orbits: No acute abnormality. Chronic RIGHT mastoid effusion is again identified. Other: None CT CERVICAL SPINE FINDINGS Alignment: Normal. Skull base and vertebrae: No acute fracture. No primary bone lesion or focal pathologic process. Soft tissues and spinal canal: No prevertebral fluid or swelling. No visible canal hematoma. Disc levels: Unchanged mild to moderate degenerative disc disease/spondylosis and mild facet arthropathy within the cervical spine. These findings contribute to the following: Mild central spinal narrowing at C3-4 Mild central spinal narrowing at C4-5 Moderate central spinal and biforaminal narrowing at C5-6. Upper chest: Negative. Other: None IMPRESSION: 1. No evidence of acute intracranial abnormality. Atrophy and chronic small-vessel white matter ischemic changes. 2. No static evidence of acute injury to the cervical spine. 3. Degenerative changes as described within significant change since 03/25/2018. Moderate central spinal and biforaminal narrowing at C5-6. 4. Chronic RIGHT mastoid effusion Electronically Signed   By: Margarette Canada M.D.   On: 12/29/2019 10:17   CT ABDOMEN PELVIS W CONTRAST  Result Date: 12/28/2019 CLINICAL DATA:  Abdominal distension, nausea and vomiting the past 10 days. Increased weakness and confusion. EXAM: CT ABDOMEN AND PELVIS WITH CONTRAST TECHNIQUE: Multidetector CT imaging of the abdomen and pelvis was performed using the standard protocol following bolus administration of intravenous contrast. CONTRAST:  161m OMNIPAQUE IOHEXOL 300 MG/ML  SOLN COMPARISON:  May 23, 2018. January 04, 2018. January 28, 2014. FINDINGS: Lower chest: Trace pleural fluid and mild bronchitis in the left lower lobe. Mild cardiomegaly. Four-vessel coronary calcification. Partial imaging of a right central vascular catheter port, its tip in the right cardiac atrium. Thoracic aortic calcified atherosclerosis. Hepatobiliary: A partially thrombosed main portal vein extending from its confluence with the mesenteric vein into the proximal portion of the right portal vein occupying approximately 50% of the vessel lumen. No thrombus in these regions on the August 2019 study. Patent right anterior and left portal veins. Cirrhosis with small amount of ascites. A 4 cm heterogeneous parenchyma in the right hepatic dome, incompletely characterized, and possibly secondary to the venous thrombosis or an underlying malignancy. A 5.2 cm gallbladder dilatation with dense sludge or tiny stones layering at the gallbladder neck, which could partially obstruct the cystic duct. No intrahepatic biliary duct dilatation. An 8 mm dilatation of the proximal common bile duct with normal distal tapering. Pancreas: Diffuse moderate atrophy and fatty infiltration of the pancreas. No acute pancreatitis. Spleen: Splenomegaly measuring 15 by 6 by 14 cm, similar. A 9 mm and 10 mm hypoattenuating lesion in the splenic parenchyma, similarly sized compared to March  2019. Although these are incompletely characterized, their stability suggests a benign hemorrhagic or cystic etiology and no further imaging characterization is indicated according to the ACR criteria. Adrenals/Urinary Tract: Punctate nonobstructing right renal calculus. Stomach/Bowel: Apparent mild diffuse colonic wall thickening that could be due in part to the adjacent ascites. Mild diverticulosis in the distal descending and sigmoid colon. No bowel obstruction. Surgically absent or decompressed appendix. Vascular/Lymphatic: Possible small focal dependent thrombus versus CT telemetry lead artifact in the  inferior cava intrahepatic portion with less than 25% luminal narrowing. Patent and ectatic splenic vein. Abdominal aorta calcified atherosclerosis without aneurysmal dilatation. Circumferential calcified atherosclerosis resulting in 75% luminal narrowing of the celiac artery and 50% narrowing of the superior mesenteric artery. An abnormally enlarged 34 by 15 mm periportal lymph node. Clustered interaortocaval and para-aortic nodes measuring up to 12 by 26 mm. Reproductive: Age-appropriate atrophy of the uterine myometrium. A 10 mm endometrial stripe thickness in the sagittal plane, abnormally thickened for an 79 year old female. Please note the patient medical record states a history of hysterectomy. Probable bilateral oophorectomy. No adnexal cyst or mass. Other: A 25 mm simple cyst in the right lateral hip subdermal subcutaneous fat plane. No associated inflammatory change or wall thickening. Musculoskeletal: Diffuse bone demineralization and moderate to advanced skeletal degenerative changes most pronounced at the lumbosacral facets. A 13 mm L4 vertebral body hemangioma present since April 2015. I discussed the critical value/emergent results and further recommended imaging by telephone at the time of interpretation on 12/28/2019 at 7:55 pm with provider Dr. Francia Greaves , who verbally acknowledged these results. 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 Small focal nonocclusive thrombus in the intrahepatic inferior vena cava. These may be secondary to the patient's cirrhosis, however, correlation with lab values including an alpha fetoprotein marker may be useful. Partial characterization of heterogeneous parenchyma in the right hepatic dome concerning for a possible  hepatocellular carcinoma. Dedicated MR or CT liver protocol imaging without and with intravenous contrast was recommended. Probable recent viral infection with mild bronchitis and trace pleural effusion in the left lower lobe, a mild uncomplicated pancolitis, and gallbladder hydrops, a 5.2 cm gallbladder dilatation with dense sludge or tiny stones layering at the gallbladder neck, which could partially obstruct the cystic duct. If there is concern for acute cholecystitis or cystic duct obstruction, hepatobiliary scintigraphy could be considered. Abnormally thickened endometrial stripe for an 79 year old female. Recommend further gynecological consultation and nonemergent pelvis ultrasonography. A nonspecific abnormally enlarged periportal lymph node and abnormally enlarged retroperitoneal lymph node with clustered smaller nodes, potentially reactive or metastatic. Punctate nonobstructing right renal calculus. Four-vessel coronary calcification. A 25 mm subdermal cyst in the right lateral hip, possibly a sebaceous cyst. No associated inflammatory change. Thoracoabdominal aortic calcified atherosclerosis.  (ICD10-I70.0). Electronically Signed   By: Revonda Humphrey   On: 12/28/2019 20:21   DG Chest Port 1 View  Result Date: 12/28/2019 CLINICAL DATA:  Weakness. EXAM: PORTABLE CHEST 1 VIEW COMPARISON:  August 10, 2019. FINDINGS: Stable cardiomegaly. No pneumothorax or pleural effusion is noted. Right lung is clear. Mild left lingular opacity is noted concerning for atelectasis or possibly infiltrate. Interval placement of right internal jugular Port-A-Cath with distal tip in expected position of cavoatrial junction. Bony thorax is unremarkable. IMPRESSION: Mild left lingular opacity is noted concerning for  atelectasis or possibly infiltrate. Electronically Signed   By: Marijo Conception M.D.   On: 12/28/2019 16:11   IMPRESSION:  *Cirrhosis, liver lesion:  Ultrasound and AFP in 04/2019 normal/without sign of  lesion. *Pancolitis on CT scan:  Has history of Cdiff in 2015.  Stool studies pending. *New non-occlusive portal vein thrombosis:  On heparin gtt. *Leukocytosis:  Improved but still very high at 27.2.  On metronidazole and cefepime. *Hypokalemia:  K+ 2.5 this AM. *CAD on Plavix *Gallbladder hydrops, a 5.2 cm gallbladder dilatation with dense sludge or tiny stones layering at the gallbladder neck:  LFT's normal except minimal elevation of total bili at 1.7.  PLAN: -Will check AFP. -Await stool studies. -Potassium replacement per primary service. -Need further imaging of liver with MR or CT liver protocol.  If MRI then she likely will need sedated due to significant claustrophobia. -Will schedule zofran ATC, 4 mg every 6 hours instead of prn and will add reglan 10 mg IV prn for ongoing complaints of nausea.   Laban Emperor. Zehr  12/29/2019, 10:23 AM    Attending physician's note   I have taken a history, examined the patient and reviewed the chart. I agree with the Advanced Practitioner's note, impression and recommendations.  78 year old female with obesity, CAD status post PCI on Plavix, type 2 diabetes, polycythemia vera and cirrhosis likely secondary to [facility]  Hepatitis A, B, C,  Autoimmune hepatitis negative.  Admitted with severe leukocytosis and sepsis  CT suggestive of cirrhosis with portal hypertension, portal vein thrombus nonocclusive, heterogeneous right hepatic dome.  Pancolonic thickening nonspecific likely bowel edema/anasarca secondary to hypoalbuminemia  GI pathogen panel, C. difficile pending  Continue IV antibiotics Diagnostic paracentesis to exclude SBP if adequate fluid to tap Patient is claustrophobic and does not think she can go through an MRI.  Request CT liver protocol to better characterize the right liver dome heterogeneous lesion  Follow-up AFP, CA and CA 19-9 Check INR/PT with tomorrow AM.  (unable to calculate MELD)  [name] Denzil Magnuson ,  MD [phone]

## 2019-12-29 NOTE — Progress Notes (Signed)
Pharmacy Antibiotic Note  Amanda [name] is a 79 y.o. female admitted on 12/28/2019 with sepsis.  Pharmacy has been consulted for Vancomycin, cefepime dosing.  Plan: Vancomycin 1777m iv x1, then Vancomycin 750 mg IV Q 24 hrs. Goal AUC 400-550. Expected AUC: 490 SCr used: 1.18  Cefepime 2gm iv q12hr  Flagyl 5065miv q8hr    Height: 5' 4.5" (163.8 cm) Weight: 212 lb 15.4 oz (96.6 kg) IBW/kg (Calculated) : 55.85  Temp (24hrs), Avg:98.2 F (36.8 C), Min:97.8 F (36.6 C), Max:98.6 F (37 C)  Recent Labs  Lab 12/28/19 1534 12/28/19 1613 12/28/19 1813  WBC 44.2*  --   --   CREATININE 1.18*  --   --   LATICACIDVEN  --  3.0* 2.2*    Estimated Creatinine Clearance: 43.3 mL/min (A) (by C-G formula based on SCr of 1.18 mg/dL (H)).    Allergies  Allergen Reactions  . Penicillins Swelling    Has patient had a PCN reaction causing immediate rash, facial/tongue/throat swelling, SOB or lightheadedness with hypotension: No Has patient had a PCN reaction causing severe rash involving mucus membranes or skin necrosis: No Has patient had a PCN reaction that required hospitalization: No Has patient had a PCN reaction occurring within the last 10 years: No If all of the above answers are "NO", then may proceed with Cephalosporin use.  . Marland Kitchenisinopril Cough  . Tape Hives  . Doxycycline Hives, Swelling and Rash  . Latex Hives, Itching and Rash    Antimicrobials this admission: Vancomycin 12/29/2019 >> Cefepime 12/29/2019 >>   Dose adjustments this admission: -  Microbiology results: -  Thank you for allowing pharmacy to be a part of this patient's care.  GrNani Skillernrowford 12/29/2019 3:45 AM

## 2019-12-29 NOTE — Progress Notes (Addendum)
ANTICOAGULATION CONSULT NOTE  Pharmacy Consult for heparin Indication: portal vein thrombosis  Allergies  Allergen Reactions  . Penicillins Swelling    Has patient had a PCN reaction causing immediate rash, facial/tongue/throat swelling, SOB or lightheadedness with hypotension: No Has patient had a PCN reaction causing severe rash involving mucus membranes or skin necrosis: No Has patient had a PCN reaction that required hospitalization: No Has patient had a PCN reaction occurring within the last 10 years: No If all of the above answers are "NO", then may proceed with Cephalosporin use.  Marland Kitchen Lisinopril Cough  . Tape Hives  . Doxycycline Hives, Swelling and Rash  . Latex Hives, Itching and Rash    Patient Measurements: Height: 5' 4.5" (163.8 cm) Weight: 212 lb 15.4 oz (96.6 kg) IBW/kg (Calculated) : 55.85 Heparin Dosing Weight: 78 kg  Vital Signs: Temp: 98.5 F (36.9 C) (03/13 0504) BP: 131/49 (03/13 0504) Pulse Rate: 91 (03/13 0504)  Labs: Recent Labs    12/28/19 1534 12/28/19 1535 12/29/19 0425 12/29/19 0748  HGB 13.7  --  10.5*  --   HCT 42.6  --  33.1*  --   PLT 266  --  161  --   HEPARINUNFRC  --   --   --  0.44  CREATININE 1.18*  --  1.11*  --   TROPONINIHS 9 12  --   --     Estimated Creatinine Clearance: 46.1 mL/min (A) (by C-G formula based on SCr of 1.11 mg/dL (H)).  Assessment: 79 yo F in ED with CC of N/V/D x 10 days.  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     Baseline INR, aPTT: not done  Prior anticoagulation: none  Significant events:  Today, 12/29/2019:  CBC: Hgb lower but still > 10 (likely dilutional); Plt WNL  Most recent heparin level therapeutic on 1100 units/hr  No bleeding or infusion issues per nursing  Goal of Therapy: Heparin level 0.3-0.7 units/ml Monitor platelets by anticoagulation protocol: Yes  Plan:  Continue heparin IV infusion at 1100 units/hr  Recheck  confirmatory heparin level in 8 hrs  Daily CBC, daily heparin level once stable  Monitor for signs of bleeding or thrombosis  Reuel Boom, PharmD, BCPS [phone] 12/29/2019, 9:58 AM    ADDENDUM  Confirmatory heparin level therapeutic but lower at 0.33 on 1100 units/hr  Continue heparin at 1100 units/hr for now; will need to adjust up if drops any further  Reuel Boom, PharmD, BCPS [phone] 12/29/2019, 6:57 PM

## 2019-12-29 NOTE — Progress Notes (Signed)
CRITICAL VALUE ALERT  Critical Value:  K 2.5  Date & Time Notied:  12/29/19  Provider Notified: Humphrey Rolls  Orders Received/Actions taken: Awaiting new orders.

## 2019-12-29 NOTE — Progress Notes (Addendum)
PROGRESS NOTE  Amanda [name] RFF:[id number] DOB: [birth year] DOA: 12/28/2019 PCP: [name], NP   LOS: 1 day   Brief narrative: As per HPI,  Amanda [name] is a 79 y.o. female with history of CAD status post stenting, hypertension, diabetes mellitus type 2, polycythemia vera, cirrhosis of the liver has been found to be having increasing weakness over the last 1 month and has progressed to become bedbound over the last week.  Patient over the last 10 days has been having increasing nausea vomiting and diarrhea with abdominal pain.  Abdominal pain is mostly epigastric area.  Denies any blood in the vomitus.  Has had multiple episodes of vomiting or diarrhea.  Denies taking any recent antibiotics.  Denies any fever chills chest pain or shortness of breath.  Given the symptoms and increasing weakness patient was brought to the ER.  Patient husband states that over the last 1 week patient has not taken any of her medications because of the vomiting.  ED Course: In the ER patient's Covid test was negative.  Labs show creatinine 1.1 potassium 2.8 high sensitive troponin IX and XII WBC count 44.2 lactic acid of 3 improved to 2.2 after hydration chest x-ray shows possible left-sided infiltrates EKG shows sinus tachycardia CT abdomen pelvis done shows features concerning for portal vein and hepatic vein thrombosis with possibility of hepatocellular carcinoma changes in the liver and also pancolitis.  Patient was started on empiric antibiotics after cultures were obtained.  Also started on IV heparin for portal vein thrombosis.  Admitted for further management.  At the time of my exam patient is alert awake and oriented to her name and place moves all extremities.  Has good sensation of the extremities.  Able to move all extremities without difficulty.  Good reflexes.  Assessment/Plan:  Principal Problem:   SIRS (systemic inflammatory response syndrome) (HCC) Active Problems:   Essential hypertension,  benign   [name] (HCC)   Essential hypertension   Hepatic cirrhosis (HCC)   Type 2 diabetes mellitus with diabetic polyneuropathy (HCC)   Pancolitis.  SIRS criteria on presentation.  Chest x-ray with possible infiltrate as well.  On empiric antibiotics with vancomycin cefepime and metronidazole.  We will continue with that for now.  No dyspnea or respiratory symptoms on supplemental oxygen at 2 L/min.  Possible pneumonia.  On supplemental oxygen.  On broad-spectrum antibiotic.  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  Will get GI consultation.  History of polycythemia vera.  Has significant leukocytosis unsure whether this is due to infection.  holding off Jakafi due to patient has not taken recently .  Patient follows up with Dr. [name] as outpatient.  Generalized weakness.  CT head and C-spine is pending.  Could be secondary to volume depletion dehydration.  Will get PT evaluation.   Acute metabolic encephalopathy.  Ammonia was within normal limits.  Possibility of infection volume depletion, liver disease.  Continue lactulose.   History of CAD status post stent.  On Plavix.  Essential hypertension on amlodipine.  Will closely monitor.  Blood pressure seems to be okay at this time.  Thickened endometrial stripe seen in the CAT scan will need OB/GYN follow-up.  Hypokalemia.  Potassium of 2.5 today.  We will continue to replenish aggressively.  Check BMP in a.m.    Chronic kidney disease stage III appears to be at baseline.  We will close monitor BMP.  Diabetes mellitus type 2 mildly hypoglycemic on presentation.  Hold off with any insulin for now  VTE Prophylaxis: Heparin drip  Code Status: DNR  Family Communication: I spoke with the patient's spouse on the phone and updated him about the clinical condition of the  patient.  Disposition Plan:  . Patient is from home . Likely disposition to home 3 days . Barriers to discharge: GI consultation, work-up, IV heparin, IV antibiotics  Consultants:  GI  Procedures:  None  Antibiotics:  . Vanco, cefepime and metronidazole 3/12>  Anti-infectives (From admission, onward)   Start     Dose/Rate Route Frequency Ordered Stop   12/29/19 2200  vancomycin (VANCOREADY) IVPB 750 mg/150 mL     750 mg 150 mL/hr over 60 Minutes Intravenous Every 24 hours 12/29/19 0354     12/29/19 0600  ceFEPIme (MAXIPIME) 2 g in sodium chloride 0.9 % 100 mL IVPB     2 g 200 mL/hr over 30 Minutes Intravenous Every 12 hours 12/28/19 2341     12/28/19 2345  metroNIDAZOLE (FLAGYL) IVPB 500 mg     500 mg 100 mL/hr over 60 Minutes Intravenous Every 8 hours 12/28/19 2341     12/28/19 1700  vancomycin (VANCOREADY) IVPB 1750 mg/350 mL     1,750 mg 175 mL/hr over 120 Minutes Intravenous  Once 12/28/19 1617 12/28/19 2150   12/28/19 1615  ceFEPIme (MAXIPIME) 2 g in sodium chloride 0.9 % 100 mL IVPB     2 g 200 mL/hr over 30 Minutes Intravenous  Once 12/28/19 1614 12/28/19 1909   12/28/19 1615  metroNIDAZOLE (FLAGYL) IVPB 500 mg     500 mg 100 mL/hr over 60 Minutes Intravenous  Once 12/28/19 1614 12/28/19 1909   12/28/19 1615  vancomycin (VANCOCIN) IVPB 1000 mg/200 mL premix  Status:  Discontinued     1,000 mg 200 mL/hr over 60 Minutes Intravenous  Once 12/28/19 1614 12/28/19 1617      Subjective: Today, patient was seen and examined at bedside.  Patient denies overt abdominal pain, nausea or vomiting or abdominal distention.  Complains of fatigue..  Denies shortness of breath, cough or fever.  Objective: Vitals:   12/28/19 2253 12/29/19 0504  BP: (!) 155/70 (!) 131/49  Pulse: 87 91  Resp: 20 14  Temp: 97.8 F (36.6 C) 98.5 F (36.9 C)  SpO2: 98% 97%    Intake/Output Summary (Last 24 hours) at 12/29/2019 1006 Last data filed at 12/29/2019 8115 Gross per 24 hour   Intake 1031.34 ml  Output 300 ml  Net 731.34 ml   Filed Weights   12/28/19 2253  Weight: 96.6 kg   Body mass index is 35.99 kg/m.   Physical Exam: GENERAL: Patient is alert awake and communicative.  Oriented to name and place.  Morbidly obese.  Not in obvious distress.  Nasal cannula in place. HENT: No scleral pallor or icterus. Pupils equally reactive to light. Oral mucosa is moist NECK: is supple, no gross swelling noted. CHEST: Clear to auscultation. No crackles or wheezes.  Diminished breath sounds bilaterally. CVS: S1 and S2 heard, no murmur. Regular rate and rhythm.  ABDOMEN: Soft, non-tender, bowel sounds are present.  Mild abdominal distention. EXTREMITIES: No edema. CNS: Cranial nerves are intact. No focal motor deficits. SKIN: warm and dry without rashes.  Data Review: I have personally reviewed the following laboratory data and studies,  CBC: Recent Labs  Lab 12/28/19 1534 12/29/19 0425  WBC 44.2* 27.2*  NEUTROABS  35.4* 22.2*  HGB 13.7 10.5*  HCT 42.6 33.1*  MCV 92.4 92.5  PLT 266 811   Basic Metabolic Panel: Recent Labs  Lab 12/28/19 1534 12/29/19 0425  NA 137 137  K 2.8* 2.5*  CL 99 103  CO2 24 24  GLUCOSE 67* 99  BUN 9 9  CREATININE 1.18* 1.11*  CALCIUM 8.9 7.9*   Liver Function Tests: Recent Labs  Lab 12/28/19 1534 12/29/19 0425  AST 30 21  ALT 7 6  ALKPHOS 117 84  BILITOT 2.0* 1.7*  PROT 6.6 5.0*  ALBUMIN 3.5 2.6*   Recent Labs  Lab 12/28/19 1534  LIPASE 23   Recent Labs  Lab 12/28/19 1535  AMMONIA 56*   Cardiac Enzymes: No results for input(s): CKTOTAL, CKMB, CKMBINDEX, TROPONINI in the last 168 hours. BNP (last 3 results) Recent Labs    08/10/19 1906  BNP 62.7    ProBNP (last 3 results) No results for input(s): PROBNP in the last 8760 hours.  CBG: Recent Labs  Lab 12/28/19 2258 12/28/19 2330 12/29/19 0735  GLUCAP 64* 107* 112*   Recent Results (from the past 240 hour(s))  Respiratory Panel by RT PCR (Flu  A&B, Covid) - Nasopharyngeal Swab     Status: None   Collection Time: 12/28/19  4:15 PM   Specimen: Nasopharyngeal Swab  Result Value Ref Range Status   SARS Coronavirus 2 by RT PCR NEGATIVE NEGATIVE Final    Comment: (NOTE) SARS-CoV-2 target nucleic acids are NOT DETECTED. The SARS-CoV-2 RNA is generally detectable in upper respiratoy specimens during the acute phase of infection. The lowest concentration of SARS-CoV-2 viral copies this assay can detect is 131 copies/mL. A negative result does not preclude SARS-Cov-2 infection and should not be used as the sole basis for treatment or other patient management decisions. A negative result may occur with  improper specimen collection/handling, submission of specimen other than nasopharyngeal swab, presence of viral mutation(s) within the areas targeted by this assay, and inadequate number of viral copies (<131 copies/mL). A negative result must be combined with clinical observations, patient history, and epidemiological information. The expected result is Negative. Fact Sheet for Patients:  PinkCheek.be Fact Sheet for Healthcare Providers:  GravelBags.it This test is not yet ap proved or cleared by the [place] FDA and  has been authorized for detection and/or diagnosis of SARS-CoV-2 by FDA under an Emergency Use Authorization (EUA). This EUA will remain  in effect (meaning this test can be used) for the duration of the COVID-19 declaration under Section 564(b)(1) of the Act, 21 U.S.C. section 360bbb-3(b)(1), unless the authorization is terminated or revoked sooner.    Influenza A by PCR NEGATIVE NEGATIVE Final   Influenza B by PCR NEGATIVE NEGATIVE Final    Comment: (NOTE) The Xpert Xpress SARS-CoV-2/FLU/RSV assay is intended as an aid in  the diagnosis of influenza from Nasopharyngeal swab specimens and  should not be used as a sole basis for treatment. Nasal washings  and  aspirates are unacceptable for Xpert Xpress SARS-CoV-2/FLU/RSV  testing. Fact Sheet for Patients: PinkCheek.be Fact Sheet for Healthcare Providers: GravelBags.it This test is not yet approved or cleared by the [place] FDA and  has been authorized for detection and/or diagnosis of SARS-CoV-2 by  FDA under an Emergency Use Authorization (EUA). This EUA will remain  in effect (meaning this test can be used) for the duration of the  Covid-19 declaration under Section 564(b)(1) of the Act, 21  U.S.C. section 360bbb-3(b)(1), unless the authorization is  terminated or revoked. Performed at [facility], [place] [address], [place], [place] [address]      Studies: CT ABDOMEN PELVIS W CONTRAST  Result Date: 12/28/2019 CLINICAL DATA:  Abdominal distension, nausea and vomiting the past 10 days. Increased weakness and confusion. EXAM: CT ABDOMEN AND PELVIS WITH CONTRAST TECHNIQUE: Multidetector CT imaging of the abdomen and pelvis was performed using the standard protocol following bolus administration of intravenous contrast. CONTRAST:  160m OMNIPAQUE IOHEXOL 300 MG/ML  SOLN COMPARISON:  May 23, 2018. January 04, 2018. January 28, 2014. FINDINGS: Lower chest: Trace pleural fluid and mild bronchitis in the left lower lobe. Mild cardiomegaly. Four-vessel coronary calcification. Partial imaging of a right central vascular catheter port, its tip in the right cardiac atrium. Thoracic aortic calcified atherosclerosis. Hepatobiliary: A partially thrombosed main portal vein extending from its confluence with the mesenteric vein into the proximal portion of the right portal vein occupying approximately 50% of the vessel lumen. No thrombus in these regions on the August 2019 study. Patent right anterior and left portal veins. Cirrhosis with small amount of ascites. A 4 cm heterogeneous parenchyma in the right hepatic dome,  incompletely characterized, and possibly secondary to the venous thrombosis or an underlying malignancy. A 5.2 cm gallbladder dilatation with dense sludge or tiny stones layering at the gallbladder neck, which could partially obstruct the cystic duct. No intrahepatic biliary duct dilatation. An 8 mm dilatation of the proximal common bile duct with normal distal tapering. Pancreas: Diffuse moderate atrophy and fatty infiltration of the pancreas. No acute pancreatitis. Spleen: Splenomegaly measuring 15 by 6 by 14 cm, similar. A 9 mm and 10 mm hypoattenuating lesion in the splenic parenchyma, similarly sized compared to March 2019. Although these are incompletely characterized, their stability suggests a benign hemorrhagic or cystic etiology and no further imaging characterization is indicated according to the ACR criteria. Adrenals/Urinary Tract: Punctate nonobstructing right renal calculus. Stomach/Bowel: Apparent mild diffuse colonic wall thickening that could be due in part to the adjacent ascites. Mild diverticulosis in the distal descending and sigmoid colon. No bowel obstruction. Surgically absent or decompressed appendix. Vascular/Lymphatic: Possible small focal dependent thrombus versus CT telemetry lead artifact in the inferior cava intrahepatic portion with less than 25% luminal narrowing. Patent and ectatic splenic vein. Abdominal aorta calcified atherosclerosis without aneurysmal dilatation. Circumferential calcified atherosclerosis resulting in 75% luminal narrowing of the celiac artery and 50% narrowing of the superior mesenteric artery. An abnormally enlarged 34 by 15 mm periportal lymph node. Clustered interaortocaval and para-aortic nodes measuring up to 12 by 26 mm. Reproductive: Age-appropriate atrophy of the uterine myometrium. A 10 mm endometrial stripe thickness in the sagittal plane, abnormally thickened for an 79year old female. Please note the patient medical record states a history of  hysterectomy. Probable bilateral oophorectomy. No adnexal cyst or mass. Other: A 25 mm simple cyst in the right lateral hip subdermal subcutaneous fat plane. No associated inflammatory change or wall thickening. Musculoskeletal: Diffuse bone demineralization and moderate to advanced skeletal degenerative changes most pronounced at the lumbosacral facets. A 13 mm L4 vertebral body hemangioma present since April 2015. I discussed the critical value/emergent results and further recommended imaging by telephone at the time of interpretation on 12/28/2019 at 7:55 pm with provider Dr. MFrancia Greaves, who verbally acknowledged these results. 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 A new diagnosis of nonocclusive thrombosis in the main portal vein extending from its confluence with the mesenteric vein into the proximal portion of the right portal  vein occupying approximately 50% of the vessel lumen. Small focal nonocclusive thrombus in the intrahepatic inferior vena cava. These may be secondary to the patient's cirrhosis, however, correlation with lab values including an alpha fetoprotein marker may be useful. Partial characterization of heterogeneous parenchyma in the right hepatic dome concerning for a possible hepatocellular carcinoma. Dedicated MR or CT liver protocol imaging without and with intravenous contrast was recommended. Probable recent viral infection with mild bronchitis and trace pleural effusion in the left lower lobe, a mild uncomplicated pancolitis, and gallbladder hydrops, a 5.2 cm gallbladder dilatation with dense sludge or tiny stones layering at the gallbladder neck, which could partially obstruct the cystic duct. If there is concern for acute cholecystitis or cystic duct obstruction, hepatobiliary scintigraphy could be considered. Abnormally thickened endometrial stripe for an 80 year old female. Recommend further gynecological consultation and nonemergent pelvis  ultrasonography. A nonspecific abnormally enlarged periportal lymph node and abnormally enlarged retroperitoneal lymph node with clustered smaller nodes, potentially reactive or metastatic. Punctate nonobstructing right renal calculus. Four-vessel coronary calcification. A 25 mm subdermal cyst in the right lateral hip, possibly a sebaceous cyst. No associated inflammatory change. Thoracoabdominal aortic calcified atherosclerosis.  (ICD10-I70.0). Electronically Signed   By: Revonda Humphrey   On: 12/28/2019 20:21   DG Chest Port 1 View  Result Date: 12/28/2019 CLINICAL DATA:  Weakness. EXAM: PORTABLE CHEST 1 VIEW COMPARISON:  August 10, 2019. FINDINGS: Stable cardiomegaly. No pneumothorax or pleural effusion is noted. Right lung is clear. Mild left lingular opacity is noted concerning for atelectasis or possibly infiltrate. Interval placement of right internal jugular Port-A-Cath with distal tip in expected position of cavoatrial junction. Bony thorax is unremarkable. IMPRESSION: Mild left lingular opacity is noted concerning for atelectasis or possibly infiltrate. Electronically Signed   By: Marijo Conception M.D.   On: 12/28/2019 16:11      Flora Lipps, MD  [facility] 12/29/2019

## 2019-12-29 NOTE — Progress Notes (Signed)
Unable to complete admission due to patient confusion and no family available.

## 2019-12-29 NOTE — Progress Notes (Signed)
Hypoglycemic Event  CBG: 64  Treatment: D50 25 mL (12.5 gm)  Symptoms: Hungry  Follow-up CBG: Time:2331 CBG Result 107  Possible Reasons for Event: Unknown  Comments/MD notified:Dr. Tally Due, Larence Penning

## 2019-12-30 ENCOUNTER — Inpatient Hospital Stay (HOSPITAL_COMMUNITY): Payer: Medicare HMO

## 2019-12-30 DIAGNOSIS — D72829 Elevated white blood cell count, unspecified: Secondary | ICD-10-CM

## 2019-12-30 LAB — PHOSPHORUS: Phosphorus: 2.2 mg/dL — ABNORMAL LOW (ref 2.5–4.6)

## 2019-12-30 LAB — GLUCOSE, CAPILLARY
Glucose-Capillary: 126 mg/dL — ABNORMAL HIGH (ref 70–99)
Glucose-Capillary: 175 mg/dL — ABNORMAL HIGH (ref 70–99)
Glucose-Capillary: 133 mg/dL — ABNORMAL HIGH (ref 70–99)
Glucose-Capillary: 123 mg/dL — ABNORMAL HIGH (ref 70–99)

## 2019-12-30 LAB — CBC
HCT: 31.6 % — ABNORMAL LOW (ref 36.0–46.0)
Hemoglobin: 9.9 g/dL — ABNORMAL LOW (ref 12.0–15.0)
MCH: 29.2 pg (ref 26.0–34.0)
MCHC: 31.3 g/dL (ref 30.0–36.0)
MCV: 93.2 fL (ref 80.0–100.0)
Platelets: 152 10*3/uL (ref 150–400)
RBC: 3.39 MIL/uL — ABNORMAL LOW (ref 3.87–5.11)
RDW: 20.3 % — ABNORMAL HIGH (ref 11.5–15.5)
WBC: 27 10*3/uL — ABNORMAL HIGH (ref 4.0–10.5)
nRBC: 0.1 % (ref 0.0–0.2)

## 2019-12-30 LAB — COMPREHENSIVE METABOLIC PANEL
ALT: 6 U/L (ref 0–44)
AST: 19 U/L (ref 15–41)
Albumin: 2.6 g/dL — ABNORMAL LOW (ref 3.5–5.0)
Alkaline Phosphatase: 84 U/L (ref 38–126)
Anion gap: 9 (ref 5–15)
BUN: 9 mg/dL (ref 8–23)
CO2: 25 mmol/L (ref 22–32)
Calcium: 7.9 mg/dL — ABNORMAL LOW (ref 8.9–10.3)
Chloride: 104 mmol/L (ref 98–111)
Creatinine, Ser: 1.06 mg/dL — ABNORMAL HIGH (ref 0.44–1.00)
GFR calc Af Amer: 57 mL/min — ABNORMAL LOW (ref >60)
GFR calc non Af Amer: 50 mL/min — ABNORMAL LOW (ref >60)
Glucose, Bld: 107 mg/dL — ABNORMAL HIGH (ref 70–99)
Potassium: 3.4 mmol/L — ABNORMAL LOW (ref 3.5–5.1)
Sodium: 138 mmol/L (ref 135–145)
Total Bilirubin: 1.8 mg/dL — ABNORMAL HIGH (ref 0.3–1.2)
Total Protein: 5 g/dL — ABNORMAL LOW (ref 6.5–8.1)

## 2019-12-30 LAB — HEPARIN LEVEL (UNFRACTIONATED): Heparin Unfractionated: 0.33 IU/mL (ref 0.30–0.70)

## 2019-12-30 LAB — MAGNESIUM: Magnesium: 1.7 mg/dL (ref 1.7–2.4)

## 2019-12-30 LAB — PROTIME-INR
INR: 1.6 — ABNORMAL HIGH (ref 0.8–1.2)
Prothrombin Time: 19.1 seconds — ABNORMAL HIGH (ref 11.4–15.2)

## 2019-12-30 LAB — AFP TUMOR MARKER: AFP, Serum, Tumor Marker: 4 ng/mL (ref 0.0–8.3)

## 2019-12-30 MED ORDER — POTASSIUM PHOSPHATES 15 MMOLE/5ML IV SOLN
30.0000 mmol | Freq: Once | INTRAVENOUS | Status: AC
  Administered 2019-12-30: 30 mmol via INTRAVENOUS
  Filled 2019-12-30: qty 10

## 2019-12-30 MED ORDER — POTASSIUM CHLORIDE CRYS ER 20 MEQ PO TBCR
40.0000 meq | EXTENDED_RELEASE_TABLET | Freq: Every day | ORAL | Status: DC
  Administered 2019-12-30 – 2020-01-02 (×3): 40 meq via ORAL
  Filled 2019-12-30 (×4): qty 2

## 2019-12-30 MED ORDER — IOHEXOL 300 MG/ML  SOLN
100.0000 mL | Freq: Once | INTRAMUSCULAR | Status: AC | PRN
  Administered 2019-12-30: 100 mL via INTRAVENOUS

## 2019-12-30 MED ORDER — SODIUM CHLORIDE (PF) 0.9 % IJ SOLN
INTRAMUSCULAR | Status: AC
  Filled 2019-12-30: qty 50

## 2019-12-30 NOTE — Evaluation (Signed)
Physical Therapy Evaluation Patient Details Name: Amanda [name] MRN: [id number] DOB: Jul 30, 1941 Today's Date: 12/30/2019   History of Present Illness  79 year old female admitted with SIRs; PMH includes CAD (s/p stents) , DM2, polycyehemia vera, cirrhosis of liver.  Clinical Impression  Patient was lying on left side resting in bed-  resting on right cheek. Explained purpose of session and she did agree, but with little initiation on her part. She presents as mildly confused- answering differently when same or similar questions were asked about home environment and her abilities. Spouse was not present and she began to have diarrhea during the assessment. RN notified. She relates that she resides with spouse in apt with no outside steps. Tub-shower combo. Standard height commode. She relates that she has a standard walker and a w/c at home and did require assistance for all bathing and dressing. She presents with generalized weakness. Will reschedule ongoing assessment to complete as tolerated secondary to the current issues as noted.     Follow Up Recommendations SNF(Depends on progression while here)    Equipment Recommendations  (Does have equipment at home)    Recommendations for Other Services       Precautions / Restrictions Precautions Precautions: Fall Precaution Comments: Monitor for s/s of nausea, diarrhea/ vomiting. Restrictions Weight Bearing Restrictions: No      Mobility  Bed Mobility Overal bed mobility: Needs Assistance Bed Mobility: Rolling;Sidelying to Sit;Supine to Sit;Sit to Supine;Sit to Sidelying Rolling: Mod assist Sidelying to sit: Mod assist Supine to sit: Mod assist;Max assist Sit to supine: Mod assist;Max assist Sit to sidelying: Mod assist General bed mobility comments: Could not complete, nor attempt any OOB activity as she was having diarrhea- and this continued throughout the morning.  Transfers                    Ambulation/Gait                 Stairs            Wheelchair Mobility    Modified Rankin (Stroke Patients Only)       Balance Overall balance assessment: Needs assistance   Sitting balance-Leahy Scale: Fair Sitting balance - Comments: Could not sit directly erect, and began to have diarrhea- so could not complete Evaluation- no OOB activity attempted during this session. Postural control: Posterior lean;Left lateral lean                                   Pertinent Vitals/Pain Pain Assessment: No/denies pain    Home Living Family/patient expects to be discharged to:: Unsure Living Arrangements: Spouse/significant other                    Prior Function Level of Independence: Independent with assistive device(s)(has standard walker and w/c- could not confirm ability, and spouse not available at time of evaluation)         Comments: She stated she used a standard walker at home as well as a w/c     Hand Dominance   Dominant Hand: Right    Extremity/Trunk Assessment        Lower Extremity Assessment Lower Extremity Assessment: Generalized weakness    Cervical / Trunk Assessment Cervical / Trunk Assessment: Kyphotic  Communication   Communication: No difficulties  Cognition Arousal/Alertness: Awake/alert Behavior During Therapy: WFL for tasks assessed/performed Overall Cognitive Status: Within Functional Limits for  tasks assessed                                 General Comments: She presents with mild confusion from standpoint that she answered differently when asked same or similar questions throughout assessment. Intermittently slow to process and answer.      General Comments      Exercises Other Exercises Other Exercises: Did mention to her to perform ankle pumps and circles- will address additional LE/UE ex next scheduled session and issue printed HEP   Assessment/Plan    PT Assessment Patient needs continued PT services   PT Problem List Decreased strength;Decreased activity tolerance;Decreased mobility       PT Treatment Interventions Gait training;Functional mobility training;Therapeutic activities;Therapeutic exercise;Patient/family education    PT Goals (Current goals can be found in the Care Plan section)  Acute Rehab PT Goals Patient Stated Goal: Just want to get well and go home PT Goal Formulation: With patient Time For Goal Achievement: 01/13/20 Potential to Achieve Goals: Good    Frequency Min 3X/week   Barriers to discharge        Co-evaluation               AM-PAC PT "6 Clicks" Mobility  Outcome Measure Help needed turning from your back to your side while in a flat bed without using bedrails?: A Lot Help needed moving from lying on your back to sitting on the side of a flat bed without using bedrails?: A Lot Help needed moving to and from a bed to a chair (including a wheelchair)?: A Lot Help needed standing up from a chair using your arms (e.g., wheelchair or bedside chair)?: A Lot Help needed to walk in hospital room?: A Lot Help needed climbing 3-5 steps with a railing? : A Lot 6 Click Score: 12    End of Session   Activity Tolerance: Other (comment)(She began to have diarrhea during assessment) Patient left: in bed;with call bell/phone within reach;with bed alarm set(Notified RN of circumstances.)   PT Visit Diagnosis: Muscle weakness (generalized) (M62.81);Other abnormalities of gait and mobility (R26.89)(she has been using w/c over the last several months)    Time: 1050-1120 PT Time Calculation (min) (ACUTE ONLY): 30 min   Charges:   PT Evaluation $PT Eval Moderate Complexity: 1 Mod PT Treatments $Therapeutic Activity: 8-22 mins        Rollen Sox, PT # [phone] CGV cell  Casandra Doffing 12/30/2019, 11:35 AM

## 2019-12-30 NOTE — Progress Notes (Signed)
ANTICOAGULATION CONSULT NOTE  Pharmacy Consult for heparin Indication: portal vein thrombosis  Allergies  Allergen Reactions  . Penicillins Swelling    Has patient had a PCN reaction causing immediate rash, facial/tongue/throat swelling, SOB or lightheadedness with hypotension: No Has patient had a PCN reaction causing severe rash involving mucus membranes or skin necrosis: No Has patient had a PCN reaction that required hospitalization: No Has patient had a PCN reaction occurring within the last 10 years: No If all of the above answers are "NO", then may proceed with Cephalosporin use.  Marland Kitchen Lisinopril Cough  . Tape Hives  . Doxycycline Hives, Swelling and Rash  . Latex Hives, Itching and Rash    Patient Measurements: Height: 5' 4.5" (163.8 cm) Weight: 212 lb 15.4 oz (96.6 kg) IBW/kg (Calculated) : 55.85 Heparin Dosing Weight: 78 kg  Vital Signs: Temp: 98.2 F (36.8 C) (03/14 0456) BP: 100/66 (03/14 0839) Pulse Rate: 80 (03/14 0456)  Labs: Recent Labs    12/28/19 1534 12/28/19 1534 12/28/19 1535 12/29/19 0425 12/29/19 0748 12/29/19 1805 12/30/19 0159  HGB 13.7   < >  --  10.5*  --   --  9.9*  HCT 42.6  --   --  33.1*  --   --  31.6*  PLT 266  --   --  161  --   --  152  LABPROT  --   --   --   --   --   --  19.1*  INR  --   --   --   --   --   --  1.6*  HEPARINUNFRC  --   --   --   --  0.44 0.33 0.33  CREATININE 1.18*  --   --  1.11*  --   --  1.06*  TROPONINIHS 9  --  12  --   --   --   --    < > = values in this interval not displayed.    Estimated Creatinine Clearance: 48.2 mL/min (A) (by C-G formula based on SCr of 1.06 mg/dL (H)).  Assessment: 79 yo F in ED with CC of N/V/D x 10 days.  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 PMH NASH   Baseline INR, aPTT: not done  Prior anticoagulation: none, but on Plavix  Significant events:  Today, 12/30/2019:  CBC: Hgb continues to drop; now just below  10g; Plt down from admission, but stable from yesterday  Most recent heparin level therapeutic on 1100 units/hr  No bleeding or infusion issues per nursing  Goal of Therapy: Heparin level 0.3-0.7 units/ml Monitor platelets by anticoagulation protocol: Yes  Plan:  Continue heparin IV infusion at 1100 units/hr  Daily CBC and heparin level  Monitor for signs of bleeding or thrombosis  Reuel Boom, PharmD, BCPS [phone] 12/30/2019, 9:48 AM

## 2019-12-30 NOTE — Progress Notes (Signed)
PROGRESS NOTE  MEREDETH FURBER FVC:[id number] DOB: 1941-07-24 DOA: 12/28/2019 PCP: [name], NP   LOS: 2 days   Brief narrative: As per HPI,  Amanda [name] is a 79 y.o. female with history of CAD status post stenting, hypertension, diabetes mellitus type 2, polycythemia vera, cirrhosis of the liver has been found to be having increasing weakness over the last 1 month and has progressed to become bedbound over the last week.  Patient over the last 10 days has been having increasing nausea, vomiting and diarrhea with abdominal pain.  Abdominal pain is mostly epigastric area.  Denies any blood in the vomitus.  Has had multiple episodes of vomiting or diarrhea.  Denies taking any recent antibiotics.   Given the symptoms and increasing weakness, patient was brought to the ER.  Patient's husband states that over the last 1 week, patient has not taken any of her medications because of the vomiting.  ED Course: In the ER patient's Covid test was negative.  Labs show creatinine 1.1 potassium 2.8 high sensitive troponin IX and XII WBC count 44.2 lactic acid of 3 improved to 2.2 after hydration chest x-ray shows possible left-sided infiltrates EKG shows sinus tachycardia CT abdomen pelvis done shows features concerning for portal vein and hepatic vein thrombosis with possibility of hepatocellular carcinoma changes in the liver and also pancolitis.  Patient was started on empiric antibiotics after cultures were obtained.  Also started on IV heparin for portal vein thrombosis.  Admitted for further management.    Assessment/Plan:  Principal Problem:   SIRS (systemic inflammatory response syndrome) (HCC) Active Problems:   Essential hypertension, benign   [name] (HCC)   Essential hypertension   Hepatic cirrhosis (HCC)   Type 2 diabetes mellitus with diabetic polyneuropathy (HCC)   Pancolitis.  SIRS criteria on presentation.  Chest x-ray with possible infiltrate as well.  On empiric  antibiotics with vancomycin, cefepime and metronidazole.  We will continue with that for now.  No dyspnea or respiratory symptoms on supplemental oxygen at 2 L/min.  Will discontinue vancomycin today.  Possible left lingular pneumonia.  On supplemental oxygen.  On broad-spectrum antibiotic.  Will discontinue vancomycin.  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  Appreciated GI consultation.  Patient is unable to undergo MRI due to claustrophobia.  CT of the liver with contrast has been ordered.  AFP of 4.0.  INR elevated at 1.6.  AST ALT within normal limits but bilirubin elevated at 1.8.  Hepatitis panel negative.  GI recommends a diagnostic asciatic tap and will order.  History of polycythemia vera.  Has significant leukocytosis ,unsure whether this is due to infection.  Holding off Jakafi due to patient has not taken recently .  Patient follows up with Dr. [name] as outpatient.  Generalized weakness.  CT head and C-spine is pending.  Could be secondary to volume depletion, dehydration.   PT evaluation pending.   Acute metabolic encephalopathy.  At baseline.  Ammonia was within normal limits.  Possibility of infection, volume depletion, liver disease.  Discontinue lactulose for diarrhea.  History of CAD status post stent.  On Plavix.  Essential hypertension on amlodipine.  Will closely monitor.  Blood pressure seems to be okay at this time.  Thickened endometrial stripe seen in the CAT scan will need OB/GYN follow-up   Hypokalemia.  Patient improved with replacement.  Check BMP in  a.m.  Hypophosphatemia.  Will replenish with potassium phosphate.  Phosphorus in a.m.  Chronic kidney disease stage III appears to be at baseline.  We will close monitor BMP.  Creatinine of 1.06 today  Diabetes mellitus type 2 mildly hypoglycemic on presentation.  He  needs regular insulin at home.  Latest POC glucose of 126.  We will put the patient on sliding-scale insulin while in the hospital.  VTE Prophylaxis: Heparin drip  Code Status: DNR  Family Communication: None today.   Disposition Plan:  . Patient is from home . Likely disposition to home/SnF in 1 to 2 days.  Physical therapy evaluation pending . Barriers to discharge: GI work-up, IV heparin, IV antibiotics, CT scan of the liver, PT evaluation pending  Consultants:  GI  Procedures:  None  Antibiotics:  . Vanco, 3/12>3/14 . cefepime and metronidazole 3/12>  Anti-infectives (From admission, onward)   Start     Dose/Rate Route Frequency Ordered Stop   12/29/19 2200  vancomycin (VANCOREADY) IVPB 750 mg/150 mL     750 mg 150 mL/hr over 60 Minutes Intravenous Every 24 hours 12/29/19 0354     12/29/19 0600  ceFEPIme (MAXIPIME) 2 g in sodium chloride 0.9 % 100 mL IVPB     2 g 200 mL/hr over 30 Minutes Intravenous Every 12 hours 12/28/19 2341     12/28/19 2345  metroNIDAZOLE (FLAGYL) IVPB 500 mg     500 mg 100 mL/hr over 60 Minutes Intravenous Every 8 hours 12/28/19 2341     12/28/19 1700  vancomycin (VANCOREADY) IVPB 1750 mg/350 mL     1,750 mg 175 mL/hr over 120 Minutes Intravenous  Once 12/28/19 1617 12/28/19 2150   12/28/19 1615  ceFEPIme (MAXIPIME) 2 g in sodium chloride 0.9 % 100 mL IVPB     2 g 200 mL/hr over 30 Minutes Intravenous  Once 12/28/19 1614 12/28/19 1909   12/28/19 1615  metroNIDAZOLE (FLAGYL) IVPB 500 mg     500 mg 100 mL/hr over 60 Minutes Intravenous  Once 12/28/19 1614 12/28/19 1909   12/28/19 1615  vancomycin (VANCOCIN) IVPB 1000 mg/200 mL premix  Status:  Discontinued     1,000 mg 200 mL/hr over 60 Minutes Intravenous  Once 12/28/19 1614 12/28/19 1617     Subjective: Today, patient denies any nausea, vomiting or abdominal pain.  Denies shortness of breath cough fever.  Nursing staff reported loose stools but on lactulose.    Objective: Vitals:    12/30/19 0456 12/30/19 0839  BP: 105/69 100/66  Pulse: 80   Resp: 20   Temp: 98.2 F (36.8 C)   SpO2: 99%     Intake/Output Summary (Last 24 hours) at 12/30/2019 1028 Last data filed at 12/30/2019 0616 Gross per 24 hour  Intake 796.53 ml  Output --  Net 796.53 ml   Filed Weights   12/28/19 2253  Weight: 96.6 kg   Body mass index is 35.99 kg/m.   Physical Exam:  GENERAL: Patient is alert awake and communicative.  Oriented to name and place.  Morbidly obese.  Not in obvious distress.  Nasal cannula in place. HENT: No scleral pallor.Pupils equally reactive to light. Oral mucosa is moist NECK: is supple, no gross swelling noted. CHEST: Clear to auscultation. No crackles or wheezes.  Diminished breath sounds bilaterally. CVS: S1 and S2 heard, no murmur. Regular rate and rhythm.  ABDOMEN: Soft, non-tender, bowel sounds are present.  Mild abdominal distention. EXTREMITIES: No edema. CNS: Cranial nerves are intact. No focal motor deficits.  SKIN: warm and dry without rashes.  Data Review: I have personally reviewed the following laboratory data and studies,  CBC: Recent Labs  Lab 12/28/19 1534 12/29/19 0425 12/30/19 0159  WBC 44.2* 27.2* 27.0*  NEUTROABS 35.4* 22.2*  --   HGB 13.7 10.5* 9.9*  HCT 42.6 33.1* 31.6*  MCV 92.4 92.5 93.2  PLT 266 161 809   Basic Metabolic Panel: Recent Labs  Lab 12/28/19 1534 12/29/19 0425 12/30/19 0159  NA 137 137 138  K 2.8* 2.5* 3.4*  CL 99 103 104  CO2 24 24 25   GLUCOSE 67* 99 107*  BUN 9 9 9   CREATININE 1.18* 1.11* 1.06*  CALCIUM 8.9 7.9* 7.9*  MG  --   --  1.7  PHOS  --   --  2.2*   Liver Function Tests: Recent Labs  Lab 12/28/19 1534 12/29/19 0425 12/30/19 0159  AST 30 21 19   ALT 7 6 6   ALKPHOS 117 84 84  BILITOT 2.0* 1.7* 1.8*  PROT 6.6 5.0* 5.0*  ALBUMIN 3.5 2.6* 2.6*   Recent Labs  Lab 12/28/19 1534  LIPASE 23   Recent Labs  Lab 12/28/19 1535  AMMONIA 56*   Cardiac Enzymes: No results for input(s):  CKTOTAL, CKMB, CKMBINDEX, TROPONINI in the last 168 hours. BNP (last 3 results) Recent Labs    08/10/19 1906  BNP 62.7    ProBNP (last 3 results) No results for input(s): PROBNP in the last 8760 hours.  CBG: Recent Labs  Lab 12/29/19 0735 12/29/19 1124 12/29/19 1621 12/29/19 2044 12/30/19 0737  GLUCAP 112* 126* 115* 130* 126*   Recent Results (from the past 240 hour(s))  Blood Culture (routine x 2)     Status: None (Preliminary result)   Collection Time: 12/28/19  4:13 PM   Specimen: BLOOD RIGHT ARM  Result Value Ref Range Status   Specimen Description   Final    BLOOD RIGHT ARM Performed at [facility] [address] [place], [place] [address]    Special Requests   Final    BOTTLES DRAWN AEROBIC AND ANAEROBIC Blood Culture adequate volume Performed at [facility] [address] [place], [place] [address]    Culture   Final    NO GROWTH < 24 HOURS Performed at [facility] [address] [place], [place] [address]    Report Status PENDING  Incomplete  Respiratory Panel by RT PCR (Flu A&B, Covid) - Nasopharyngeal Swab     Status: None   Collection Time: 12/28/19  4:15 PM   Specimen: Nasopharyngeal Swab  Result Value Ref Range Status   SARS Coronavirus 2 by RT PCR NEGATIVE NEGATIVE Final    Comment: (NOTE) SARS-CoV-2 target nucleic acids are NOT DETECTED. The SARS-CoV-2 RNA is generally detectable in upper respiratoy specimens during the acute phase of infection. The lowest concentration of SARS-CoV-2 viral copies this assay can detect is 131 copies/mL. A negative result does not preclude SARS-Cov-2 infection and should not be used as the sole basis for treatment or other patient management decisions. A negative result may occur with  improper specimen collection/handling, submission of specimen other than nasopharyngeal swab, presence of viral mutation(s) within the areas targeted by this assay, and inadequate  number of viral copies (<131 copies/mL). A negative result must be combined with clinical observations, patient history, and epidemiological information. The expected result is Negative. Fact Sheet for Patients:  PinkCheek.be Fact Sheet for Healthcare Providers:  GravelBags.it This test is not  yet ap proved or cleared by the [place] and  has been authorized for detection and/or diagnosis of SARS-CoV-2 by FDA under an Emergency Use Authorization (EUA). This EUA will remain  in effect (meaning this test can be used) for the duration of the COVID-19 declaration under Section 564(b)(1) of the Act, 21 U.S.C. section 360bbb-3(b)(1), unless the authorization is terminated or revoked sooner.    Influenza A by PCR NEGATIVE NEGATIVE Final   Influenza B by PCR NEGATIVE NEGATIVE Final    Comment: (NOTE) The Xpert Xpress SARS-CoV-2/FLU/RSV assay is intended as an aid in  the diagnosis of influenza from Nasopharyngeal swab specimens and  should not be used as a sole basis for treatment. Nasal washings and  aspirates are unacceptable for Xpert Xpress SARS-CoV-2/FLU/RSV  testing. Fact Sheet for Patients: PinkCheek.be Fact Sheet for Healthcare Providers: GravelBags.it This test is not yet approved or cleared by the [place] FDA and  has been authorized for detection and/or diagnosis of SARS-CoV-2 by  FDA under an Emergency Use Authorization (EUA). This EUA will remain  in effect (meaning this test can be used) for the duration of the  Covid-19 declaration under Section 564(b)(1) of the Act, 21  U.S.C. section 360bbb-3(b)(1), unless the authorization is  terminated or revoked. Performed at [facility], [place] [address] [place], [place] [address]      Studies: CT HEAD WO CONTRAST  Result Date: 12/29/2019 CLINICAL DATA:  79 year old female with  altered mental status, generalized weakness and neck pain. EXAM: CT HEAD WITHOUT CONTRAST CT CERVICAL SPINE WITHOUT CONTRAST TECHNIQUE: Multidetector CT imaging of the head and cervical spine was performed following the standard protocol without intravenous contrast. Multiplanar CT image reconstructions of the cervical spine were also generated. COMPARISON:  03/22/2018 brain MR, 01/29/2014 head CT, 03/25/2018 neck CT and other studies. FINDINGS: CT HEAD FINDINGS Brain: No evidence of acute infarction, hemorrhage, hydrocephalus, extra-axial collection or mass lesion/mass effect. Atrophy and chronic small-vessel white matter ischemic changes again noted. Vascular: Carotid and vertebral atherosclerotic calcifications are noted. Skull: Normal. Negative for fracture or focal lesion. Sinuses/Orbits: No acute abnormality. Chronic RIGHT mastoid effusion is again identified. Other: None CT CERVICAL SPINE FINDINGS Alignment: Normal. Skull base and vertebrae: No acute fracture. No primary bone lesion or focal pathologic process. Soft tissues and spinal canal: No prevertebral fluid or swelling. No visible canal hematoma. Disc levels: Unchanged mild to moderate degenerative disc disease/spondylosis and mild facet arthropathy within the cervical spine. These findings contribute to the following: Mild central spinal narrowing at C3-4 Mild central spinal narrowing at C4-5 Moderate central spinal and biforaminal narrowing at C5-6. Upper chest: Negative. Other: None IMPRESSION: 1. No evidence of acute intracranial abnormality. Atrophy and chronic small-vessel white matter ischemic changes. 2. No static evidence of acute injury to the cervical spine. 3. Degenerative changes as described within significant change since 03/25/2018. Moderate central spinal and biforaminal narrowing at C5-6. 4. Chronic RIGHT mastoid effusion Electronically Signed   By: Margarette Canada M.D.   On: 12/29/2019 10:17   CT CERVICAL SPINE WO CONTRAST  Result  Date: 12/29/2019 CLINICAL DATA:  79 year old female with altered mental status, generalized weakness and neck pain. EXAM: CT HEAD WITHOUT CONTRAST CT CERVICAL SPINE WITHOUT CONTRAST TECHNIQUE: Multidetector CT imaging of the head and cervical spine was performed following the standard protocol without intravenous contrast. Multiplanar CT image reconstructions of the cervical spine were also generated. COMPARISON:  03/22/2018 brain MR, 01/29/2014 head CT, 03/25/2018 neck CT and other studies.  FINDINGS: CT HEAD FINDINGS Brain: No evidence of acute infarction, hemorrhage, hydrocephalus, extra-axial collection or mass lesion/mass effect. Atrophy and chronic small-vessel white matter ischemic changes again noted. Vascular: Carotid and vertebral atherosclerotic calcifications are noted. Skull: Normal. Negative for fracture or focal lesion. Sinuses/Orbits: No acute abnormality. Chronic RIGHT mastoid effusion is again identified. Other: None CT CERVICAL SPINE FINDINGS Alignment: Normal. Skull base and vertebrae: No acute fracture. No primary bone lesion or focal pathologic process. Soft tissues and spinal canal: No prevertebral fluid or swelling. No visible canal hematoma. Disc levels: Unchanged mild to moderate degenerative disc disease/spondylosis and mild facet arthropathy within the cervical spine. These findings contribute to the following: Mild central spinal narrowing at C3-4 Mild central spinal narrowing at C4-5 Moderate central spinal and biforaminal narrowing at C5-6. Upper chest: Negative. Other: None IMPRESSION: 1. No evidence of acute intracranial abnormality. Atrophy and chronic small-vessel white matter ischemic changes. 2. No static evidence of acute injury to the cervical spine. 3. Degenerative changes as described within significant change since 03/25/2018. Moderate central spinal and biforaminal narrowing at C5-6. 4. Chronic RIGHT mastoid effusion Electronically Signed   By: Margarette Canada M.D.   On:  12/29/2019 10:17   CT ABDOMEN PELVIS W CONTRAST  Result Date: 12/28/2019 CLINICAL DATA:  Abdominal distension, nausea and vomiting the past 10 days. Increased weakness and confusion. EXAM: CT ABDOMEN AND PELVIS WITH CONTRAST TECHNIQUE: Multidetector CT imaging of the abdomen and pelvis was performed using the standard protocol following bolus administration of intravenous contrast. CONTRAST:  122m OMNIPAQUE IOHEXOL 300 MG/ML  SOLN COMPARISON:  May 23, 2018. January 04, 2018. January 28, 2014. FINDINGS: Lower chest: Trace pleural fluid and mild bronchitis in the left lower lobe. Mild cardiomegaly. Four-vessel coronary calcification. Partial imaging of a right central vascular catheter port, its tip in the right cardiac atrium. Thoracic aortic calcified atherosclerosis. Hepatobiliary: A partially thrombosed main portal vein extending from its confluence with the mesenteric vein into the proximal portion of the right portal vein occupying approximately 50% of the vessel lumen. No thrombus in these regions on the August 2019 study. Patent right anterior and left portal veins. Cirrhosis with small amount of ascites. A 4 cm heterogeneous parenchyma in the right hepatic dome, incompletely characterized, and possibly secondary to the venous thrombosis or an underlying malignancy. A 5.2 cm gallbladder dilatation with dense sludge or tiny stones layering at the gallbladder neck, which could partially obstruct the cystic duct. No intrahepatic biliary duct dilatation. An 8 mm dilatation of the proximal common bile duct with normal distal tapering. Pancreas: Diffuse moderate atrophy and fatty infiltration of the pancreas. No acute pancreatitis. Spleen: Splenomegaly measuring 15 by 6 by 14 cm, similar. A 9 mm and 10 mm hypoattenuating lesion in the splenic parenchyma, similarly sized compared to March 2019. Although these are incompletely characterized, their stability suggests a benign hemorrhagic or cystic etiology and no  further imaging characterization is indicated according to the ACR criteria. Adrenals/Urinary Tract: Punctate nonobstructing right renal calculus. Stomach/Bowel: Apparent mild diffuse colonic wall thickening that could be due in part to the adjacent ascites. Mild diverticulosis in the distal descending and sigmoid colon. No bowel obstruction. Surgically absent or decompressed appendix. Vascular/Lymphatic: Possible small focal dependent thrombus versus CT telemetry lead artifact in the inferior cava intrahepatic portion with less than 25% luminal narrowing. Patent and ectatic splenic vein. Abdominal aorta calcified atherosclerosis without aneurysmal dilatation. Circumferential calcified atherosclerosis resulting in 75% luminal narrowing of the celiac artery and 50% narrowing of the superior  mesenteric artery. An abnormally enlarged 34 by 15 mm periportal lymph node. Clustered interaortocaval and para-aortic nodes measuring up to 12 by 26 mm. Reproductive: Age-appropriate atrophy of the uterine myometrium. A 10 mm endometrial stripe thickness in the sagittal plane, abnormally thickened for an 79 year old female. Please note the patient medical record states a history of hysterectomy. Probable bilateral oophorectomy. No adnexal cyst or mass. Other: A 25 mm simple cyst in the right lateral hip subdermal subcutaneous fat plane. No associated inflammatory change or wall thickening. Musculoskeletal: Diffuse bone demineralization and moderate to advanced skeletal degenerative changes most pronounced at the lumbosacral facets. A 13 mm L4 vertebral body hemangioma present since April 2015. I discussed the critical value/emergent results and further recommended imaging by telephone at the time of interpretation on 12/28/2019 at 7:55 pm with provider Dr. Francia Greaves , who verbally acknowledged these results. 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 A new diagnosis of nonocclusive thrombosis in the  main portal vein extending from its confluence with the mesenteric vein into the proximal portion of the right portal vein occupying approximately 50% of the vessel lumen. Small focal nonocclusive thrombus in the intrahepatic inferior vena cava. These may be secondary to the patient's cirrhosis, however, correlation with lab values including an alpha fetoprotein marker may be useful. Partial characterization of heterogeneous parenchyma in the right hepatic dome concerning for a possible hepatocellular carcinoma. Dedicated MR or CT liver protocol imaging without and with intravenous contrast was recommended. Probable recent viral infection with mild bronchitis and trace pleural effusion in the left lower lobe, a mild uncomplicated pancolitis, and gallbladder hydrops, a 5.2 cm gallbladder dilatation with dense sludge or tiny stones layering at the gallbladder neck, which could partially obstruct the cystic duct. If there is concern for acute cholecystitis or cystic duct obstruction, hepatobiliary scintigraphy could be considered. Abnormally thickened endometrial stripe for an 79 year old female. Recommend further gynecological consultation and nonemergent pelvis ultrasonography. A nonspecific abnormally enlarged periportal lymph node and abnormally enlarged retroperitoneal lymph node with clustered smaller nodes, potentially reactive or metastatic. Punctate nonobstructing right renal calculus. Four-vessel coronary calcification. A 25 mm subdermal cyst in the right lateral hip, possibly a sebaceous cyst. No associated inflammatory change. Thoracoabdominal aortic calcified atherosclerosis.  (ICD10-I70.0). Electronically Signed   By: Revonda Humphrey   On: 12/28/2019 20:21   DG Chest Port 1 View  Result Date: 12/28/2019 CLINICAL DATA:  Weakness. EXAM: PORTABLE CHEST 1 VIEW COMPARISON:  August 10, 2019. FINDINGS: Stable cardiomegaly. No pneumothorax or pleural effusion is noted. Right lung is clear. Mild left lingular  opacity is noted concerning for atelectasis or possibly infiltrate. Interval placement of right internal jugular Port-A-Cath with distal tip in expected position of cavoatrial junction. Bony thorax is unremarkable. IMPRESSION: Mild left lingular opacity is noted concerning for atelectasis or possibly infiltrate. Electronically Signed   By: Marijo Conception M.D.   On: 12/28/2019 16:11      Flora Lipps, MD  [facility] 12/30/2019

## 2019-12-30 NOTE — Progress Notes (Signed)
Patient has had 6 bowel movements this shift after taking lactulose this morning. Stool is mucous like and flaky, not liquid. Also awakening from sleeping randomly having dry heaves and nausea throughout shift.  C. Diff/GI panel and enteric precautions were d/c'd this a.m. because patient had only had 2 bowel movements since admission. Dr. Louanne Belton made aware. New orders received.

## 2019-12-30 NOTE — Progress Notes (Addendum)
[facility] Progress Note  CC:  Cirrhosis, liver mass/lesion  Subjective:  Patient currently getting cleaned up after having several bowel movements this AM (had been given lactulose).  Spoke with husband outside of the room instead to discuss with the patient.  Objective:  Vital signs in last 24 hours: Temp:  [98.2 F (36.8 C)-98.8 F (37.1 C)] 98.2 F (36.8 C) (03/14 0456) Pulse Rate:  [80-86] 80 (03/14 0456) Resp:  [20] 20 (03/14 0456) BP: (100-136)/(57-69) 100/66 (03/14 0839) SpO2:  [99 %] 99 % (03/14 1118) Last BM Date: 12/29/19  Not examined as she was getting cleaned up during the time of my visit.  Intake/Output from previous day: 03/13 0701 - 03/14 0700 In: 796.5 [I.V.:247; IV Piggyback:549.5] Out: -   Lab Results: Recent Labs    12/28/19 1534 12/29/19 0425 12/30/19 0159  WBC 44.2* 27.2* 27.0*  HGB 13.7 10.5* 9.9*  HCT 42.6 33.1* 31.6*  PLT 266 161 152   BMET Recent Labs    12/28/19 1534 12/29/19 0425 12/30/19 0159  NA 137 137 138  K 2.8* 2.5* 3.4*  CL 99 103 104  CO2 24 24 25   GLUCOSE 67* 99 107*  BUN 9 9 9   CREATININE 1.18* 1.11* 1.06*  CALCIUM 8.9 7.9* 7.9*   LFT Recent Labs    12/29/19 0425 12/29/19 0425 12/30/19 0159  PROT 5.0*   < > 5.0*  ALBUMIN 2.6*   < > 2.6*  AST 21   < > 19  ALT 6   < > 6  ALKPHOS 84   < > 84  BILITOT 1.7*   < > 1.8*  BILIDIR 0.7*  --   --   IBILI 1.0*  --   --    < > = values in this interval not displayed.   PT/INR Recent Labs    12/30/19 0159  LABPROT 19.1*  INR 1.6*   CT HEAD WO CONTRAST  Result Date: 12/29/2019 CLINICAL DATA:  79 year old female with altered mental status, generalized weakness and neck pain. EXAM: CT HEAD WITHOUT CONTRAST CT CERVICAL SPINE WITHOUT CONTRAST TECHNIQUE: Multidetector CT imaging of the head and cervical spine was performed following the standard protocol without intravenous contrast. Multiplanar CT image reconstructions of the cervical spine were  also generated. COMPARISON:  03/22/2018 brain MR, 01/29/2014 head CT, 03/25/2018 neck CT and other studies. FINDINGS: CT HEAD FINDINGS Brain: No evidence of acute infarction, hemorrhage, hydrocephalus, extra-axial collection or mass lesion/mass effect. Atrophy and chronic small-vessel white matter ischemic changes again noted. Vascular: Carotid and vertebral atherosclerotic calcifications are noted. Skull: Normal. Negative for fracture or focal lesion. Sinuses/Orbits: No acute abnormality. Chronic RIGHT mastoid effusion is again identified. Other: None CT CERVICAL SPINE FINDINGS Alignment: Normal. Skull base and vertebrae: No acute fracture. No primary bone lesion or focal pathologic process. Soft tissues and spinal canal: No prevertebral fluid or swelling. No visible canal hematoma. Disc levels: Unchanged mild to moderate degenerative disc disease/spondylosis and mild facet arthropathy within the cervical spine. These findings contribute to the following: Mild central spinal narrowing at C3-4 Mild central spinal narrowing at C4-5 Moderate central spinal and biforaminal narrowing at C5-6. Upper chest: Negative. Other: None IMPRESSION: 1. No evidence of acute intracranial abnormality. Atrophy and chronic small-vessel white matter ischemic changes. 2. No static evidence of acute injury to the cervical spine. 3. Degenerative changes as described within significant change since 03/25/2018. Moderate central spinal and biforaminal narrowing at C5-6. 4. Chronic RIGHT mastoid effusion Electronically Signed  By: Margarette Canada M.D.   On: 12/29/2019 10:17   CT CERVICAL SPINE WO CONTRAST  Result Date: 12/29/2019 CLINICAL DATA:  79 year old female with altered mental status, generalized weakness and neck pain. EXAM: CT HEAD WITHOUT CONTRAST CT CERVICAL SPINE WITHOUT CONTRAST TECHNIQUE: Multidetector CT imaging of the head and cervical spine was performed following the standard protocol without intravenous contrast.  Multiplanar CT image reconstructions of the cervical spine were also generated. COMPARISON:  03/22/2018 brain MR, 01/29/2014 head CT, 03/25/2018 neck CT and other studies. FINDINGS: CT HEAD FINDINGS Brain: No evidence of acute infarction, hemorrhage, hydrocephalus, extra-axial collection or mass lesion/mass effect. Atrophy and chronic small-vessel white matter ischemic changes again noted. Vascular: Carotid and vertebral atherosclerotic calcifications are noted. Skull: Normal. Negative for fracture or focal lesion. Sinuses/Orbits: No acute abnormality. Chronic RIGHT mastoid effusion is again identified. Other: None CT CERVICAL SPINE FINDINGS Alignment: Normal. Skull base and vertebrae: No acute fracture. No primary bone lesion or focal pathologic process. Soft tissues and spinal canal: No prevertebral fluid or swelling. No visible canal hematoma. Disc levels: Unchanged mild to moderate degenerative disc disease/spondylosis and mild facet arthropathy within the cervical spine. These findings contribute to the following: Mild central spinal narrowing at C3-4 Mild central spinal narrowing at C4-5 Moderate central spinal and biforaminal narrowing at C5-6. Upper chest: Negative. Other: None IMPRESSION: 1. No evidence of acute intracranial abnormality. Atrophy and chronic small-vessel white matter ischemic changes. 2. No static evidence of acute injury to the cervical spine. 3. Degenerative changes as described within significant change since 03/25/2018. Moderate central spinal and biforaminal narrowing at C5-6. 4. Chronic RIGHT mastoid effusion Electronically Signed   By: Margarette Canada M.D.   On: 12/29/2019 10:17   CT ABDOMEN PELVIS W CONTRAST  Result Date: 12/28/2019 CLINICAL DATA:  Abdominal distension, nausea and vomiting the past 10 days. Increased weakness and confusion. EXAM: CT ABDOMEN AND PELVIS WITH CONTRAST TECHNIQUE: Multidetector CT imaging of the abdomen and pelvis was performed using the standard protocol  following bolus administration of intravenous contrast. CONTRAST:  158m OMNIPAQUE IOHEXOL 300 MG/ML  SOLN COMPARISON:  May 23, 2018. January 04, 2018. January 28, 2014. FINDINGS: Lower chest: Trace pleural fluid and mild bronchitis in the left lower lobe. Mild cardiomegaly. Four-vessel coronary calcification. Partial imaging of a right central vascular catheter port, its tip in the right cardiac atrium. Thoracic aortic calcified atherosclerosis. Hepatobiliary: A partially thrombosed main portal vein extending from its confluence with the mesenteric vein into the proximal portion of the right portal vein occupying approximately 50% of the vessel lumen. No thrombus in these regions on the August 2019 study. Patent right anterior and left portal veins. Cirrhosis with small amount of ascites. A 4 cm heterogeneous parenchyma in the right hepatic dome, incompletely characterized, and possibly secondary to the venous thrombosis or an underlying malignancy. A 5.2 cm gallbladder dilatation with dense sludge or tiny stones layering at the gallbladder neck, which could partially obstruct the cystic duct. No intrahepatic biliary duct dilatation. An 8 mm dilatation of the proximal common bile duct with normal distal tapering. Pancreas: Diffuse moderate atrophy and fatty infiltration of the pancreas. No acute pancreatitis. Spleen: Splenomegaly measuring 15 by 6 by 14 cm, similar. A 9 mm and 10 mm hypoattenuating lesion in the splenic parenchyma, similarly sized compared to March 2019. Although these are incompletely characterized, their stability suggests a benign hemorrhagic or cystic etiology and no further imaging characterization is indicated according to the ACR criteria. Adrenals/Urinary Tract: Punctate nonobstructing right  renal calculus. Stomach/Bowel: Apparent mild diffuse colonic wall thickening that could be due in part to the adjacent ascites. Mild diverticulosis in the distal descending and sigmoid colon. No bowel  obstruction. Surgically absent or decompressed appendix. Vascular/Lymphatic: Possible small focal dependent thrombus versus CT telemetry lead artifact in the inferior cava intrahepatic portion with less than 25% luminal narrowing. Patent and ectatic splenic vein. Abdominal aorta calcified atherosclerosis without aneurysmal dilatation. Circumferential calcified atherosclerosis resulting in 75% luminal narrowing of the celiac artery and 50% narrowing of the superior mesenteric artery. An abnormally enlarged 34 by 15 mm periportal lymph node. Clustered interaortocaval and para-aortic nodes measuring up to 12 by 26 mm. Reproductive: Age-appropriate atrophy of the uterine myometrium. A 10 mm endometrial stripe thickness in the sagittal plane, abnormally thickened for an 79 year old female. Please note the patient medical record states a history of hysterectomy. Probable bilateral oophorectomy. No adnexal cyst or mass. Other: A 25 mm simple cyst in the right lateral hip subdermal subcutaneous fat plane. No associated inflammatory change or wall thickening. Musculoskeletal: Diffuse bone demineralization and moderate to advanced skeletal degenerative changes most pronounced at the lumbosacral facets. A 13 mm L4 vertebral body hemangioma present since April 2015. I discussed the critical value/emergent results and further recommended imaging by telephone at the time of interpretation on 12/28/2019 at 7:55 pm with provider Dr. Francia Greaves , who verbally acknowledged these results. 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 Small focal nonocclusive thrombus in the intrahepatic inferior vena cava. These may be secondary to the patient's cirrhosis, however, correlation with lab values  including an alpha fetoprotein marker may be useful. Partial characterization of heterogeneous parenchyma in the right hepatic dome concerning for a possible hepatocellular carcinoma. Dedicated MR or CT liver protocol imaging without and with intravenous contrast was recommended. Probable recent viral infection with mild bronchitis and trace pleural effusion in the left lower lobe, a mild uncomplicated pancolitis, and gallbladder hydrops, a 5.2 cm gallbladder dilatation with dense sludge or tiny stones layering at the gallbladder neck, which could partially obstruct the cystic duct. If there is concern for acute cholecystitis or cystic duct obstruction, hepatobiliary scintigraphy could be considered. Abnormally thickened endometrial stripe for an 79 year old female. Recommend further gynecological consultation and nonemergent pelvis ultrasonography. A nonspecific abnormally enlarged periportal lymph node and abnormally enlarged retroperitoneal lymph node with clustered smaller nodes, potentially reactive or metastatic. Punctate nonobstructing right renal calculus. Four-vessel coronary calcification. A 25 mm subdermal cyst in the right lateral hip, possibly a sebaceous cyst. No associated inflammatory change. Thoracoabdominal aortic calcified atherosclerosis.  (ICD10-I70.0). Electronically Signed   By: Revonda Humphrey   On: 12/28/2019 20:21   DG Chest Port 1 View  Result Date: 12/28/2019 CLINICAL DATA:  Weakness. EXAM: PORTABLE CHEST 1 VIEW COMPARISON:  August 10, 2019. FINDINGS: Stable cardiomegaly. No pneumothorax or pleural effusion is noted. Right lung is clear. Mild left lingular opacity is noted concerning for atelectasis or possibly infiltrate. Interval placement of right internal jugular Port-A-Cath with distal tip in expected position of cavoatrial junction. Bony thorax is unremarkable. IMPRESSION: Mild left lingular opacity is noted concerning for atelectasis or possibly infiltrate. Electronically Signed    By: Marijo Conception M.D.   On: 12/28/2019 16:11   CT LIVER ABDOMEN W WO CONTRAST  Result Date: 12/30/2019  CLINICAL DATA:  Inpatient. Cirrhosis. Possible right liver lesion on recent routine CT abdomen/pelvis study. EXAM: CT ABDOMEN WITHOUT AND WITH CONTRAST TECHNIQUE: Multidetector CT imaging of the abdomen was performed following the standard protocol before and following the bolus administration of intravenous contrast. CONTRAST:  131m OMNIPAQUE IOHEXOL 300 MG/ML  SOLN COMPARISON:  12/28/2019 CT abdomen/pelvis. FINDINGS: Lower chest: No significant pulmonary nodules or acute consolidative airspace disease. Superior approach central venous catheter tip seen at the cavoatrial junction. Coronary atherosclerosis. Hepatobiliary: Diffusely markedly irregular liver surface with slight relative hypertrophy of the lateral segment left liver lobe, compatible with advanced cirrhosis. There is heterogeneous density throughout the liver parenchyma on all sequences. On the precontrast sequence, there is an ill-defined geographic region of decreased attenuation throughout the segment 7 right liver lobe (series 2/image 20), without arterial phase hyperenhancement or obvious portal venous washout (this region remains decreased in attenuation compared to the background parenchyma on all sequences), favoring heterogeneous hepatic steatosis. No discrete liver masses, specifically no foci of arterial phase hyperenhancement or portal venous washout in the liver. Distended gallbladder (5.0 cm diameter). Layering small gallstones in the gallbladder. No definite gallbladder wall thickening. No biliary ductal dilatation. CBD diameter 6 mm. Pancreas: Normal, with no mass or duct dilation. Spleen: Mild splenomegaly. Craniocaudal splenic length 13.5 cm. No splenic mass. Adrenals/Urinary Tract: Normal adrenals. No renal stones. No hydronephrosis. No renal masses. Stomach/Bowel: Small hiatal hernia. Otherwise normal nondistended stomach.  Visualized small and large bowel is normal caliber, with no bowel wall thickening. Vascular/Lymphatic: Atherosclerotic nonaneurysmal abdominal aorta. Patent hepatic, splenic and renal veins. 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 Stable small paraumbilical varices. No pathologically enlarged lymph nodes in the abdomen. Other: No pneumoperitoneum. Small volume abdominal ascites, similar. No focal fluid collections. Musculoskeletal: No aggressive appearing focal osseous lesions. Mild thoracolumbar spondylosis. IMPRESSION: 1. Advanced cirrhosis. Ill-defined geographic region of abnormal density in the segment 7 right liver lobe, with features favoring heterogeneous hepatic steatosis. No discrete liver masses, specifically no foci of arterial phase hyperenhancement or portal venous washout in the liver. Close imaging surveillance advised with MRI (strongly preferred) or CT abdomen without and with IV contrast in 3 months. 2. 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 3. Stable small volume abdominal ascites. Mild splenomegaly. Small paraumbilical varices. 4. Cholelithiasis. Distended gallbladder, nonspecific. Correlate with ultrasound if there is clinical concern for acute cholecystitis. No biliary ductal dilatation. 5. Small hiatal hernia. 6. Aortic Atherosclerosis (ICD10-I70.0). Electronically Signed   By: JIlona SorrelM.D.   On: 12/30/2019 11:09   Assessment / Plan: *Cirrhosis, liver lesion:  Ultrasound and AFP in 04/2019 normal/without sign of lesion.  CT liver without suggestion of mass. *Pancolitis on CT scan:  Has history of Cdiff in 2015.  Stool studies pending. *New non-occlusive portal vein thrombosis:  On heparin gtt. *Leukocytosis:  Improved but still very high at 27.  On metronidazole and cefepime. *Hypokalemia:  Improved.  K+ 3.4 this AM. *CAD on Plavix *Gallbladder hydrops, a 5.2 cm gallbladder dilatation with  dense sludge or tiny stones layering at the gallbladder neck:  LFT's normal except minimal elevation of total bili at 1.8.  PLAN: -Await AFP. -Await stool studies. -Potassium replacement per primary service. -Repeat imaging of the liver in 6 months as always recommended in cirrhosis. -If we think that she has HE then could place her on xifaxan 550 mg BID.   LOS: 2 days   JLaban Emperor Zehr  12/30/2019, 11:35  AM    Attending physician's note   I have taken an interval history, reviewed the chart and examined the patient. I agree with the Advanced Practitioner's note, impression and recommendations.   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 Time line unclear here chronic vs sub acute  Sepsis with leucocytosis on IV antibiotics  Diffuse colonic thickening likely secondary to hypo albuminemia Await stool studies to exclude C.diff  GI will cont to follow   K. Denzil Magnuson , MD [phone]

## 2019-12-31 ENCOUNTER — Other Ambulatory Visit: Payer: Medicare HMO

## 2019-12-31 ENCOUNTER — Inpatient Hospital Stay: Payer: Medicare HMO | Admitting: Hematology & Oncology

## 2019-12-31 ENCOUNTER — Encounter (HOSPITAL_COMMUNITY): Payer: Self-pay | Admitting: Internal Medicine

## 2019-12-31 ENCOUNTER — Inpatient Hospital Stay (HOSPITAL_COMMUNITY): Payer: Medicare HMO

## 2019-12-31 DIAGNOSIS — I81 Portal vein thrombosis: Secondary | ICD-10-CM

## 2019-12-31 DIAGNOSIS — R188 Other ascites: Secondary | ICD-10-CM

## 2019-12-31 DIAGNOSIS — D72829 Elevated white blood cell count, unspecified: Secondary | ICD-10-CM

## 2019-12-31 DIAGNOSIS — E1122 Type 2 diabetes mellitus with diabetic chronic kidney disease: Secondary | ICD-10-CM

## 2019-12-31 DIAGNOSIS — R112 Nausea with vomiting, unspecified: Secondary | ICD-10-CM

## 2019-12-31 DIAGNOSIS — I82220 Acute embolism and thrombosis of inferior vena cava: Secondary | ICD-10-CM

## 2019-12-31 DIAGNOSIS — R6510 Systemic inflammatory response syndrome (SIRS) of non-infectious origin without acute organ dysfunction: Secondary | ICD-10-CM

## 2019-12-31 DIAGNOSIS — R197 Diarrhea, unspecified: Secondary | ICD-10-CM

## 2019-12-31 DIAGNOSIS — I129 Hypertensive chronic kidney disease with stage 1 through stage 4 chronic kidney disease, or unspecified chronic kidney disease: Secondary | ICD-10-CM

## 2019-12-31 DIAGNOSIS — N189 Chronic kidney disease, unspecified: Secondary | ICD-10-CM

## 2019-12-31 DIAGNOSIS — Z791 Long term (current) use of non-steroidal anti-inflammatories (NSAID): Secondary | ICD-10-CM

## 2019-12-31 DIAGNOSIS — K7460 Unspecified cirrhosis of liver: Secondary | ICD-10-CM

## 2019-12-31 DIAGNOSIS — I82439 Acute embolism and thrombosis of unspecified popliteal vein: Secondary | ICD-10-CM

## 2019-12-31 DIAGNOSIS — D45 Polycythemia vera: Secondary | ICD-10-CM

## 2019-12-31 DIAGNOSIS — I2510 Atherosclerotic heart disease of native coronary artery without angina pectoris: Secondary | ICD-10-CM

## 2019-12-31 DIAGNOSIS — R4182 Altered mental status, unspecified: Secondary | ICD-10-CM

## 2019-12-31 LAB — COMPREHENSIVE METABOLIC PANEL
ALT: 9 U/L (ref 0–44)
ALT: 6 U/L (ref 0–44)
AST: 19 U/L (ref 15–41)
AST: 18 U/L (ref 15–41)
Albumin: 2.6 g/dL — ABNORMAL LOW (ref 3.5–5.0)
Albumin: 2.7 g/dL — ABNORMAL LOW (ref 3.5–5.0)
Alkaline Phosphatase: 78 U/L (ref 38–126)
Alkaline Phosphatase: 89 U/L (ref 38–126)
Anion gap: 8 (ref 5–15)
Anion gap: 10 (ref 5–15)
BUN: 8 mg/dL (ref 8–23)
BUN: 8 mg/dL (ref 8–23)
CO2: 23 mmol/L (ref 22–32)
CO2: 22 mmol/L (ref 22–32)
Calcium: 8 mg/dL — ABNORMAL LOW (ref 8.9–10.3)
Calcium: 8.1 mg/dL — ABNORMAL LOW (ref 8.9–10.3)
Chloride: 106 mmol/L (ref 98–111)
Chloride: 105 mmol/L (ref 98–111)
Creatinine, Ser: 1.17 mg/dL — ABNORMAL HIGH (ref 0.44–1.00)
Creatinine, Ser: 1.01 mg/dL — ABNORMAL HIGH (ref 0.44–1.00)
GFR calc Af Amer: 51 mL/min — ABNORMAL LOW (ref >60)
GFR calc Af Amer: >60 mL/min (ref >60)
GFR calc non Af Amer: 44 mL/min — ABNORMAL LOW (ref >60)
GFR calc non Af Amer: 53 mL/min — ABNORMAL LOW (ref >60)
Glucose, Bld: 129 mg/dL — ABNORMAL HIGH (ref 70–99)
Glucose, Bld: 164 mg/dL — ABNORMAL HIGH (ref 70–99)
Potassium: 4 mmol/L (ref 3.5–5.1)
Potassium: 3.6 mmol/L (ref 3.5–5.1)
Sodium: 137 mmol/L (ref 135–145)
Sodium: 137 mmol/L (ref 135–145)
Total Bilirubin: 1.4 mg/dL — ABNORMAL HIGH (ref 0.3–1.2)
Total Bilirubin: 1.2 mg/dL (ref 0.3–1.2)
Total Protein: 5.3 g/dL — ABNORMAL LOW (ref 6.5–8.1)
Total Protein: 5.2 g/dL — ABNORMAL LOW (ref 6.5–8.1)

## 2019-12-31 LAB — GRAM STAIN

## 2019-12-31 LAB — CBC
HCT: 32.2 % — ABNORMAL LOW (ref 36.0–46.0)
Hemoglobin: 10.3 g/dL — ABNORMAL LOW (ref 12.0–15.0)
MCH: 30 pg (ref 26.0–34.0)
MCHC: 32 g/dL (ref 30.0–36.0)
MCV: 93.9 fL (ref 80.0–100.0)
Platelets: 142 10*3/uL — ABNORMAL LOW (ref 150–400)
RBC: 3.43 MIL/uL — ABNORMAL LOW (ref 3.87–5.11)
RDW: 20.5 % — ABNORMAL HIGH (ref 11.5–15.5)
WBC: 24.4 10*3/uL — ABNORMAL HIGH (ref 4.0–10.5)
nRBC: 0.2 % (ref 0.0–0.2)

## 2019-12-31 LAB — CBC WITH DIFFERENTIAL/PLATELET
Abs Immature Granulocytes: 2.06 10*3/uL — ABNORMAL HIGH (ref 0.00–0.07)
Basophils Absolute: 0.2 10*3/uL — ABNORMAL HIGH (ref 0.0–0.1)
Basophils Relative: 1 %
Eosinophils Absolute: 0 10*3/uL (ref 0.0–0.5)
Eosinophils Relative: 0 %
HCT: 32.6 % — ABNORMAL LOW (ref 36.0–46.0)
Hemoglobin: 10.3 g/dL — ABNORMAL LOW (ref 12.0–15.0)
Immature Granulocytes: 8 %
Lymphocytes Relative: 5 %
Lymphs Abs: 1.4 10*3/uL (ref 0.7–4.0)
MCH: 29.6 pg (ref 26.0–34.0)
MCHC: 31.6 g/dL (ref 30.0–36.0)
MCV: 93.7 fL (ref 80.0–100.0)
Monocytes Absolute: 1 10*3/uL (ref 0.1–1.0)
Monocytes Relative: 4 %
Neutro Abs: 22.1 10*3/uL — ABNORMAL HIGH (ref 1.7–7.7)
Neutrophils Relative %: 82 %
Platelets: 149 10*3/uL — ABNORMAL LOW (ref 150–400)
RBC: 3.48 MIL/uL — ABNORMAL LOW (ref 3.87–5.11)
RDW: 20.5 % — ABNORMAL HIGH (ref 11.5–15.5)
WBC: 26.7 10*3/uL — ABNORMAL HIGH (ref 4.0–10.5)
nRBC: 0.2 % (ref 0.0–0.2)

## 2019-12-31 LAB — BODY FLUID CELL COUNT WITH DIFFERENTIAL
Lymphs, Fluid: 33 %
Monocyte-Macrophage-Serous Fluid: 22 % — ABNORMAL LOW (ref 50–90)
Neutrophil Count, Fluid: 45 % — ABNORMAL HIGH (ref 0–25)
Total Nucleated Cell Count, Fluid: 236 cu mm (ref 0–1000)

## 2019-12-31 LAB — CANCER ANTIGEN 19-9: CA 19-9: 15 U/mL (ref 0–35)

## 2019-12-31 LAB — HEPARIN LEVEL (UNFRACTIONATED)
Heparin Unfractionated: <0.1 IU/mL — ABNORMAL LOW (ref 0.30–0.70)
Heparin Unfractionated: 0.2 IU/mL — ABNORMAL LOW (ref 0.30–0.70)

## 2019-12-31 LAB — GLUCOSE, CAPILLARY
Glucose-Capillary: 127 mg/dL — ABNORMAL HIGH (ref 70–99)
Glucose-Capillary: 97 mg/dL (ref 70–99)
Glucose-Capillary: 140 mg/dL — ABNORMAL HIGH (ref 70–99)
Glucose-Capillary: 153 mg/dL — ABNORMAL HIGH (ref 70–99)
Glucose-Capillary: 131 mg/dL — ABNORMAL HIGH (ref 70–99)

## 2019-12-31 LAB — C-REACTIVE PROTEIN: CRP: 1.2 mg/dL — ABNORMAL HIGH (ref <1.0)

## 2019-12-31 LAB — AMMONIA: Ammonia: 42 umol/L — ABNORMAL HIGH (ref 9–35)

## 2019-12-31 LAB — MAGNESIUM: Magnesium: 1.7 mg/dL (ref 1.7–2.4)

## 2019-12-31 LAB — ALBUMIN, PLEURAL OR PERITONEAL FLUID: Albumin, Fluid: <1 g/dL

## 2019-12-31 LAB — PHOSPHORUS: Phosphorus: 2.7 mg/dL (ref 2.5–4.6)

## 2019-12-31 LAB — CEA: CEA: 3.4 ng/mL (ref 0.0–4.7)

## 2019-12-31 LAB — AFP TUMOR MARKER: AFP, Serum, Tumor Marker: 3.9 ng/mL (ref 0.0–8.3)

## 2019-12-31 MED ORDER — BOOST / RESOURCE BREEZE PO LIQD CUSTOM
1.0000 | Freq: Two times a day (BID) | ORAL | Status: DC
  Administered 2020-01-01 – 2020-01-02 (×2): 1 via ORAL

## 2019-12-31 MED ORDER — HEPARIN (PORCINE) 25000 UT/250ML-% IV SOLN
1350.0000 [IU]/h | INTRAVENOUS | Status: DC
  Administered 2019-12-31: 1350 [IU]/h via INTRAVENOUS

## 2019-12-31 MED ORDER — LACTULOSE 10 GM/15ML PO SOLN
20.0000 g | Freq: Every day | ORAL | Status: DC
  Administered 2020-01-01 – 2020-01-02 (×2): 20 g via ORAL
  Filled 2019-12-31 (×2): qty 30

## 2019-12-31 MED ORDER — LIDOCAINE HCL 1 % IJ SOLN
INTRAMUSCULAR | Status: AC
  Filled 2019-12-31: qty 20

## 2019-12-31 MED ORDER — HEPARIN (PORCINE) 25000 UT/250ML-% IV SOLN
1500.0000 [IU]/h | INTRAVENOUS | Status: DC
  Administered 2019-12-31: 1500 [IU]/h via INTRAVENOUS
  Filled 2019-12-31: qty 250

## 2019-12-31 MED ORDER — RISAQUAD PO CAPS
1.0000 | ORAL_CAPSULE | Freq: Every day | ORAL | Status: DC
  Administered 2020-01-01 – 2020-01-02 (×2): 1 via ORAL
  Filled 2019-12-31 (×2): qty 1

## 2019-12-31 MED ORDER — HEPARIN BOLUS VIA INFUSION
1500.0000 [IU] | Freq: Once | INTRAVENOUS | Status: AC
  Administered 2019-12-31: 1500 [IU] via INTRAVENOUS
  Filled 2019-12-31: qty 1500

## 2019-12-31 MED ORDER — ADULT MULTIVITAMIN W/MINERALS CH
1.0000 | ORAL_TABLET | Freq: Every day | ORAL | Status: DC
  Administered 2020-01-01 – 2020-01-02 (×2): 1 via ORAL
  Filled 2019-12-31 (×2): qty 1

## 2019-12-31 MED ORDER — LOPERAMIDE HCL 2 MG PO CAPS
4.0000 mg | ORAL_CAPSULE | Freq: Once | ORAL | Status: AC
  Administered 2019-12-31: 4 mg via ORAL
  Filled 2019-12-31: qty 2

## 2019-12-31 NOTE — Care Management Important Message (Signed)
Important Message  Patient Details IM Letter given to Dessa Phi RN Case Manager to present to the Patient Name: Amanda [name] MRN: [id number] Date of Birth: 02/02/1941   Medicare Important Message Given:  Yes     Kerin Salen 12/31/2019, 12:33 PM

## 2019-12-31 NOTE — Progress Notes (Signed)
ANTICOAGULATION CONSULT NOTE - Follow Up Consult  Pharmacy Consult for heparin Indication: portal vein thrombosis  Allergies  Allergen Reactions  . Penicillins Swelling    Has patient had a PCN reaction causing immediate rash, facial/tongue/throat swelling, SOB or lightheadedness with hypotension: No Has patient had a PCN reaction causing severe rash involving mucus membranes or skin necrosis: No Has patient had a PCN reaction that required hospitalization: No Has patient had a PCN reaction occurring within the last 10 years: No If all of the above answers are "NO", then may proceed with Cephalosporin use.  Marland Kitchen Lisinopril Cough  . Tape Hives  . Doxycycline Hives, Swelling and Rash  . Latex Hives, Itching and Rash   Patient Measurements: Height: 5' 4.5" (163.8 cm) Weight: 212 lb 15.4 oz (96.6 kg) IBW/kg (Calculated) : 55.85 Heparin Dosing Weight: 78kg  Vital Signs: Temp: 98.1 F (36.7 C) (03/15 1526) Temp Source: Oral (03/15 1526) BP: 159/67 (03/15 1526) Pulse Rate: 105 (03/15 1526)  Labs: Recent Labs    12/30/19 0159 12/30/19 0159 12/30/19 2329 12/31/19 1330 12/31/19 1630  HGB 9.9*   < > 10.3* 10.3*  --   HCT 31.6*  --  32.2* 32.6*  --   PLT 152  --  142* 149*  --   LABPROT 19.1*  --   --   --   --   INR 1.6*  --   --   --   --   HEPARINUNFRC 0.33  --  <0.10*  --  0.20*  CREATININE 1.06*  --  1.17* 1.01*  --    < > = values in this interval not displayed.   Estimated Creatinine Clearance: 50.6 mL/min (A) (by C-G formula based on SCr of 1.01 mg/dL (H)).  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 Abdominal CT on 3/12 showed cirrhosis with small volume ascites, portal hypertension, and nonocclusive portal vein thrombosis.  Patient was started on heparin drip on admission for thrombosis.  Today, 12/31/2019: AM Heparin level result did not  cross-over from [employer] to Epic this morning.  Verifed with Lab tech Elmyra Ricks) - level this morning was undetectable at < 0.10. Per pt's RN, no issue with IV line and no bleeding noted - Increased Heparin infusion to 1350 units/hr, rechecked level at 1630 - no bleeding documented  1630: HL ordered, resulted 0.2 units/ml, below therapeutic range 1740: RN lost peripheral IV access 1740, before heparin level resulted to adjust Heparin was infusing via peripheral line, using PAC for abx (Cefepime & Flagyl) & Hep levels Waiting on IV team to establish IV access to resume Heparin At 2000 Heparin resumed,   Goal of Therapy:  Heparin level 0.3-0.7 units/ml Monitor platelets by anticoagulation protocol: Yes   Plan:  Increase Heparin to 1500 units/hr Re-bolus with 1500 units Daily Heparin level, daily CBC Monitor for s/s bleeding  Minda Ditto PharmD 12/31/2019,6:02 PM

## 2019-12-31 NOTE — Procedures (Signed)
Ultrasound-guided diagnostic and therapeutic paracentesis performed yielding 1.5 liters of yellow fluid. No immediate complications. A portion of the fluid was sent to the lab for preordered studies. EBL none.

## 2019-12-31 NOTE — Progress Notes (Signed)
Patient ID: Amanda [name], female   DOB: May 19, 1941, 79 y.o.   MRN: [id number]    Progress Note   Subjective  Day #4 CC; cirrhosis, nausea, vomiting diarrhea, leukocytosis  Patient and husband state patient had been sick at home for about a week and a half prior to admission, had taken to bed with lack of appetite and lethargy, some mild confusion per her husband.  No documented fever or chills.  A couple of days prior to admission had developed diarrhea which is not unusual for her but also had nausea and vomiting which was persistent.  She had been off most of her meds over the week and a half prior to admission due to nausea and vomiting.  Today's labs, WBC 26.7, hemoglobin 10.3, platelets 149 Creatinine 1.0, LFTs within normal limits  AFP 3.9, CEA and CA 19-9 within normal limits INR 1.6 C. difficile pending  CT abdomen/hepatic protocol yesterday-diffuse markedly irregular liver surface consistent with advanced cirrhosis heterogeneous density throughout the liver parenchyma, there is an ill-defined geographic region of decreased attenuation, favoring heterogeneous hepatic steatosis, no discrete liver masses.  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  On IV heparin On cefepime  Patient says she is feeling much better than on admit, husband says she is mentating normally.  She remains very nauseated and has vomited a couple of times overnight.  No complaints of abdominal pain.  She has had some diarrhea this morning x1    Objective   Vital signs in last 24 hours: Temp:  [97.9 F (36.6 C)-98.1 F (36.7 C)] 97.9 F (36.6 C) (03/15 0932) Pulse Rate:  [80-85] 80 (03/15 0932)  Resp:  [18-20] 20 (03/15 0631) BP: (134-139)/(51-56) 134/51 (03/15 0932) SpO2:  [96 %-100 %] 100 % (03/15 1049) Last BM Date: 12/31/19 General: Elderly   white female in NAD acute and chronically ill-appearing, husband at bedside, patient holding emesis bag Heart:  Regular rate and rhythm; no murmurs Lungs: Respirations even and unlabored, decreased bilateral bases Abdomen:  Soft, obese, nontender no definite fluid wave normal bowel sounds. Extremities:  Without edema. Neurologic:  Alert and oriented,  grossly normal neurologically.  No asterixis Psych:  Cooperative. Normal mood and affect.  Intake/Output from previous day: 03/14 0701 - 03/15 0700 In: 1341.8 [P.O.:60; I.V.:251.3; IV Piggyback:1030.5] Out: -  Intake/Output this shift: Total I/O In: 60 [P.O.:60] Out: -   Lab Results: Recent Labs    12/30/19 0159 12/30/19 2329 12/31/19 1330  WBC 27.0* 24.4* 26.7*  HGB 9.9* 10.3* 10.3*  HCT 31.6* 32.2* 32.6*  PLT 152 142* 149*   BMET Recent Labs    12/30/19 0159 12/30/19 2329 12/31/19 1330  NA 138 137 137  K 3.4* 4.0 3.6  CL 104 106 105  CO2 25 23 22   GLUCOSE 107* 129* 164*  BUN 9 8 8   CREATININE 1.06* 1.17* 1.01*  CALCIUM 7.9* 8.0* 8.1*   LFT Recent Labs    12/29/19 0425 12/30/19 0159 12/31/19 1330  PROT 5.0*   < > 5.2*  ALBUMIN 2.6*   < > 2.7*  AST 21   < > 18  ALT 6   < > 6  ALKPHOS 84   < > 89  BILITOT 1.7*   < >  1.2  BILIDIR 0.7*  --   --   IBILI 1.0*  --   --    < > = values in this interval not displayed.   PT/INR Recent Labs    12/30/19 0159  LABPROT 19.1*  INR 1.6*    Studies/Results: CT LIVER ABDOMEN W WO CONTRAST  Result Date: 12/30/2019 CLINICAL DATA:  Inpatient. Cirrhosis. Possible right liver lesion on recent routine CT abdomen/pelvis study. EXAM: CT ABDOMEN WITHOUT AND WITH CONTRAST TECHNIQUE: Multidetector CT imaging of the abdomen was performed following the standard protocol before and following the bolus administration of  intravenous contrast. CONTRAST:  164m OMNIPAQUE IOHEXOL 300 MG/ML  SOLN COMPARISON:  12/28/2019 CT abdomen/pelvis. FINDINGS: Lower chest: No significant pulmonary nodules or acute consolidative airspace disease. Superior approach central venous catheter tip seen at the cavoatrial junction. Coronary atherosclerosis. Hepatobiliary: Diffusely markedly irregular liver surface with slight relative hypertrophy of the lateral segment left liver lobe, compatible with advanced cirrhosis. There is heterogeneous density throughout the liver parenchyma on all sequences. On the precontrast sequence, there is an ill-defined geographic region of decreased attenuation throughout the segment 7 right liver lobe (series 2/image 20), without arterial phase hyperenhancement or obvious portal venous washout (this region remains decreased in attenuation compared to the background parenchyma on all sequences), favoring heterogeneous hepatic steatosis. No discrete liver masses, specifically no foci of arterial phase hyperenhancement or portal venous washout in the liver. Distended gallbladder (5.0 cm diameter). Layering small gallstones in the gallbladder. No definite gallbladder wall thickening. No biliary ductal dilatation. CBD diameter 6 mm. Pancreas: Normal, with no mass or duct dilation. Spleen: Mild splenomegaly. Craniocaudal splenic length 13.5 cm. No splenic mass. Adrenals/Urinary Tract: Normal adrenals. No renal stones. No hydronephrosis. No renal masses. Stomach/Bowel: Small hiatal hernia. Otherwise normal nondistended stomach. Visualized small and large bowel is normal caliber, with no bowel wall thickening. Vascular/Lymphatic: Atherosclerotic nonaneurysmal abdominal aorta. Patent hepatic, splenic and renal veins. 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 Stable small paraumbilical varices. No pathologically enlarged lymph nodes in the abdomen. Other: No pneumoperitoneum. Small volume  abdominal ascites, similar. No focal fluid collections. Musculoskeletal: No aggressive appearing focal osseous lesions. Mild thoracolumbar spondylosis. IMPRESSION: 1. Advanced cirrhosis. Ill-defined geographic region of abnormal density in the segment 7 right liver lobe, with features favoring heterogeneous hepatic steatosis. No discrete liver masses, specifically no foci of arterial phase hyperenhancement or portal venous washout in the liver. Close imaging surveillance advised with MRI (strongly preferred) or CT abdomen without and with IV contrast in 3 months. 2. 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 3. Stable small volume abdominal ascites. Mild splenomegaly. Small paraumbilical varices. 4. Cholelithiasis. Distended gallbladder, nonspecific. Correlate with ultrasound if there is clinical concern for acute cholecystitis. No biliary ductal dilatation. 5. Small hiatal hernia. 6. Aortic Atherosclerosis (ICD10-I70.0). Electronically Signed   By: JIlona SorrelM.D.   On: 12/30/2019 11:09       Assessment / Plan:    #[id number] old white female with decompensated [facility], progressive weakness, nausea vomiting and diarrhea Patient with underlying coronary artery disease, hypertension, diabetes, polycythemia vera.  Met SIRS criteria on admission Possible left lingular pneumonia on chest x-ray on admit  Patient has history of chronic diarrhea, no increase from her baseline according to patient.  CT yesterday with hepatic protocol showed no bowel wall thickening or evidence of colitis C. 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  She is on IV heparin, duration of anticoagulation will need to be determined.  #3 leukocytosis-marked--patient had been on Jakafi per hematology, PCV  #4 coronary artery disease-on Plavix as  outpatient #5 gallbladder hydrops-distended gallbladder/5 cm with stones  no wall thickening #6 lethargy and altered mental status prior to admit-suspect component of hepatic encephalopathy, now improved with lactulose  #7 possible hepatic lesion-normal AFP, CT with hepatic protocol yesterday did not show any definite hepatic mass, will need repeat imaging in 3 to 6 months   Plan; repeat chest x-ray today Hematology consultation regarding significant leukocytosis, in setting of polycythemia vera on chronic Jakafi which had been held for about 2 weeks prior to admission. Would also like hematology input regarding anticoagulation for the portal vein thrombus, nonocclusive, subacute  Continue Maxipime, has also been on empiric metronidazole Consider DC metronidazole  We will add lactulose 30 cc once daily  Will need to discuss continued Plavix use if she is also to be anticoagulated especially given setting of underlying cirrhosis.   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

## 2019-12-31 NOTE — Progress Notes (Signed)
PTPhysical Therapy Treatment Patient Details Name: Amanda [name] MRN: [id number] DOB: 20-May-1941 Today's Date: 12/31/2019    History of Present Illness 79 year old female admitted with SIRs; PMH includes CAD (s/p stents) , DM2, polycyehemia vera, cirrhosis of liver.    PT Comments    Patient resting in bed when PT arrived. She was lying on right side- spouse visiting at bedside. She is continuing to have dry heaves and some diarrhea. GI physicians were in and agreed that she should temporarily be on hold until she is more stable and can participate in meaningful way. She was unable to perform any bed mobility. Did discuss appropriate ex format and reviewed LE ex with spouse in presence of patient and printed sheet with full commentary and pictures was issued. Will await re-instatement when patient is stable enough to participate.  Follow Up Recommendations  SNF(Pending progression her- Spouse states that they have already picked out an outpatient clinic for her to attend at discharge.)     Equipment Recommendations       Recommendations for Other Services       Precautions / Restrictions Precautions Precautions: Fall Precaution Comments: Monitor for s/s of nausea, diarrhea/ vomiting. Restrictions Weight Bearing Restrictions: No Other Position/Activity Restrictions: Will place on MED HOLD per suggestions by GI physicians as she is currently not able to effectively participate in PT    Mobility  Bed Mobility Overal bed mobility: Needs Assistance(could not test this am, as patient states "she cannot do anything by lie on her side")             General bed mobility comments: Could not actively participate in any bed mobility or OOB activity for optimal assessment and POC. Discussed plan to place on HOLD from PT until she is more stable and able physically to be active  Transfers                    Ambulation/Gait                 Stairs              Wheelchair Mobility    Modified Rankin (Stroke Patients Only)       Balance       Sitting balance - Comments: Could not achieve any bed mobility to sit on edge of bed. GI physicians were in and in agreement to hold PT until she is more stable                                    Cognition Arousal/Alertness: Awake/alert Behavior During Therapy: Anxious(preferring to rest, lying on right side- intermittent coughing and dry heaves)                                   General Comments: Explained ex format with spouse and he is very supportive of PT. He is in agreement for her to be on HOLD for active PT bed mobility and out of bed activity until she is more stable      Exercises Other Exercises Other Exercises: Printed sheet was issued to spouse and full instructions given in presence of patient. Encouraged her to perform ankle pumps and circles frequently - and to move her LEs as much as possible.    General Comments  Pertinent Vitals/Pain Pain Assessment: 0-10 Pain Score: 3  Pain Location: Abdomen Pain Descriptors / Indicators: Aching    Home Living                      Prior Function            PT Goals (current goals can now be found in the care plan section) Progress towards PT goals: Not progressing toward goals - comment(have been unable to complete a full assessment to determine best POC- currently am placing on HOLD)    Frequency    (currently place on HOLD for medical instability)      PT Plan      Co-evaluation              AM-PAC PT "6 Clicks" Mobility   Outcome Measure  Help needed turning from your back to your side while in a flat bed without using bedrails?: A Lot Help needed moving from lying on your back to sitting on the side of a flat bed without using bedrails?: A Lot Help needed moving to and from a bed to a chair (including a wheelchair)?: A Lot Help needed standing up from a chair  using your arms (e.g., wheelchair or bedside chair)?: A Lot Help needed to walk in hospital room?: A Lot Help needed climbing 3-5 steps with a railing? : Total 6 Click Score: 11    End of Session     Patient left: in bed;with call bell/phone within reach;with bed alarm set   PT Visit Diagnosis: Muscle weakness (generalized) (M62.81);Other abnormalities of gait and mobility (R26.89)     Time: 1330-1100 PT Time Calculation (min) (ACUTE ONLY): 1290 min  Charges:  $Therapeutic Exercise: 23-37 mins                   Rollen Sox, PT # [phone] CGV cell  Casandra Doffing 12/31/2019, 10:59 AM

## 2019-12-31 NOTE — Consult Note (Addendum)
[place]  Telephone:([phone]) [phone] Fax:([phone] [place]  Referring MD:  Dr. 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 Erker is an 79 year old female with a past medical history significant for CAD status post stenting, hypertension, diabetes, polycythemia vera, cirrhosis.  Patient presented to the hospital with nausea, vomiting, diarrhea, and confusion.  On admission, her WBC was 44.2, LFTs were normal, total bilirubin was elevated at 2.0.  Chest x-ray showed mild left lingular opacity concerning for atelectasis or possible infiltrate.  CT of the abdomen pelvis showed cirrhosis with small volume ascites and portal hypertension and a new nonocclusive thrombosis in the main portal vein and a small focal nonocclusive thrombus in the intrahepatic inferior vena cava.  The patient was started on a heparin drip.  For her polycythemia vera, she has been on [place] which she has not taken for the past 1 to 2 weeks due to not feeling well.  When seen today, the patient is resting and her husband is at the bedside.  He states that her confusion is somewhat improved today.  She has been having some dry heaving at times bringing up phlegm.  She is not having any nausea or vomiting.  He reports that her diarrhea has improved significantly.  She is currently taking Imodium.  She is on a heparin drip he has not seen any bleeding.  He has not noticed any recent fevers or chills.  No chest pain or shortness of breath has been noted.  Hematology was asked see the patient for recommendations regarding anticoagulation in the setting of cirrhosis.   Past Medical History:  Diagnosis Date  . Allergic rhinitis 01/23/2016  . C. difficile colitis   . CAD (coronary artery disease)    a. s/p STEMI in 01/2015 with 95% LCx stenosis and distal 80% LCx stenosis (DESx2 placed)  . Cancer ([place])    SKIN  .  Candidiasis of skin 09/30/2014  . Cirrhosis ([place])   . Depression   . Diabetes mellitus 2008  . Gout   . Herpes simplex   . Hyperglycemia 05/31/2013  . Hyperlipidemia   . Hypertension   . Myocardial infarction ([place])   . Neuromuscular disorder ([place])    BELL PALSY  . Obesity   . Polycythemia    Dr. Elease Hashimoto- [facility]  . Psoriasis   :    Past Surgical History:  Procedure Laterality Date  . ANKLE FRACTURE SURGERY    . BIOPSY  05/26/2018   Procedure: BIOPSY;  Surgeon: [name], MD;  Location: [facility] ENDOSCOPY;  Service: Endoscopy;;  . BREAST SURGERY Left    milk duct  . CATARACT EXTRACTION, BILATERAL    . COLONOSCOPY    . ESOPHAGOGASTRODUODENOSCOPY N/A 05/26/2018   Procedure: ESOPHAGOGASTRODUODENOSCOPY (EGD);  Surgeon: [name], MD;  Location: [facility] ENDOSCOPY;  Service: Endoscopy;  Laterality: N/A;  . IR IMAGING GUIDED PORT INSERTION  08/30/2019  . IR PARACENTESIS  05/25/2018  . LEFT HEART CATHETERIZATION WITH CORONARY ANGIOGRAM N/A 01/27/2015   Procedure: LEFT HEART CATHETERIZATION WITH CORONARY ANGIOGRAM;  Surgeon: [name], MD;  Location: [facility] CATH LAB;  Service: Cardiovascular;  Laterality: N/A;  . TONSILLECTOMY    . TOTAL ABDOMINAL HYSTERECTOMY W/ BILATERAL SALPINGOOPHORECTOMY     for heavy periods with appendectomy  :   CURRENT MEDS: Current Facility-Administered Medications  Medication Dose Route Frequency Provider Last Rate Last Admin  . acetaminophen (TYLENOL)  tablet 650 mg  650 mg Oral Q6H PRN Rise Patience, MD       Or  . acetaminophen (TYLENOL) suppository 650 mg  650 mg Rectal Q6H PRN Rise Patience, MD      . acidophilus (RISAQUAD) capsule 1 capsule  1 capsule Oral Daily Pokhrel, Laxman, MD      . amLODipine (NORVASC) tablet 5 mg  5 mg Oral Daily Rise Patience, MD   5 mg at 12/31/19 1057  . ceFEPIme (MAXIPIME) 2 g in sodium chloride 0.9 % 100 mL IVPB  2 g Intravenous Q12H Rise Patience, MD 200 mL/hr at 12/31/19 0601 Rate Verify  at 12/31/19 0601  . Chlorhexidine Gluconate Cloth 2 % PADS 6 each  6 each Topical Daily Rise Patience, MD   6 each at 12/30/19 1756  . clopidogrel (PLAVIX) tablet 75 mg  75 mg Oral Daily Rise Patience, MD   75 mg at 12/31/19 1057  . heparin ADULT infusion 100 units/mL (25000 units/260m sodium chloride 0.45%)  1,350 Units/hr Intravenous Continuous PLynelle Doctor RPH 13.5 mL/hr at 12/31/19 0839 1,350 Units/hr at 12/31/19 0839  . hydrALAZINE (APRESOLINE) injection 10 mg  10 mg Intravenous Q4H PRN KRise Patience MD      . insulin aspart (novoLOG) injection 0-6 Units  0-6 Units Subcutaneous TID WC KRise Patience MD   1 Units at 12/30/19 1244  . latanoprost (XALATAN) 0.005 % ophthalmic solution 1 drop  1 drop Both Eyes QHS KRise Patience MD   1 drop at 12/30/19 2117  . metroNIDAZOLE (FLAGYL) IVPB 500 mg  500 mg Intravenous Q8H KRise Patience MD 100 mL/hr at 12/31/19 0635 500 mg at 12/31/19 0635  . ondansetron (ZOFRAN) injection 4 mg  4 mg Intravenous Q6H Zehr, Jessica D, PA-C   4 mg at 12/31/19 1242  . potassium chloride SA (KLOR-CON) CR tablet 40 mEq  40 mEq Oral Daily Pokhrel, Laxman, MD   40 mEq at 12/30/19 1203  . sodium chloride flush (NS) 0.9 % injection 10-40 mL  10-40 mL Intracatheter Q12H KRise Patience MD      . sodium chloride flush (NS) 0.9 % injection 10-40 mL  10-40 mL Intracatheter PRN KRise Patience MD   10 mL at 12/29/19 0426  . timolol (TIMOPTIC) 0.5 % ophthalmic solution 1 drop  1 drop Both Eyes Daily KRise Patience MD   1 drop at 12/30/19 0857      Allergies  Allergen Reactions  . Penicillins Swelling    Has patient had a PCN reaction causing immediate rash, facial/tongue/throat swelling, SOB or lightheadedness with hypotension: No Has patient had a PCN reaction causing severe rash involving mucus membranes or skin necrosis: No Has patient had a PCN reaction that required hospitalization: No Has patient had a PCN  reaction occurring within the last 10 years: No If all of the above answers are "NO", then may proceed with Cephalosporin use.  .Marland KitchenLisinopril Cough  . Tape Hives  . Doxycycline Hives, Swelling and Rash  . Latex Hives, Itching and Rash  :  Family History  Problem Relation Age of Onset  . Diabetes Mother   . Heart disease Mother        CAD  . Hyperlipidemia Mother   . Hypertension Mother   . Kidney disease Mother   . Kidney disease Father   . Breast cancer Maternal Aunt   . Heart disease Maternal Grandmother   . Heart disease  Other        maternal aunts and uncles  . Colon cancer Neg Hx   . Esophageal cancer Neg Hx   :  Social History   Socioeconomic History  . Marital status: Married    Spouse name: [name]  . Number of children: 2  . Years of education: 2 yr colle  . Highest education level: Not on file  Occupational History  . Occupation: housewife  Tobacco Use  . Smoking status: Former Smoker    Years: 48.00    Types: Cigarettes    Quit date: 10/18/1986    Years since quitting: 33.2  . Smokeless tobacco: Never Used  Substance and Sexual Activity  . Alcohol use: No    Alcohol/week: 0.0 standard drinks  . Drug use: No  . Sexual activity: Yes    Partners: Male    Birth control/protection: None  Other Topics Concern  . Not on file  Social History Narrative   2 caffeine drink per day.  No regular exercise.     Retired from the bank   2 children (Daughter and a son) son has morbid obesity   2 grandchildren   3 great grandchildren   Social Determinants of Radio broadcast assistant Strain:   . Difficulty of Paying Living Expenses:   Food Insecurity:   . Worried About Charity fundraiser in the Last Year:   . Arboriculturist in the Last Year:   Transportation Needs:   . Film/video editor (Medical):   Marland Kitchen Lack of Transportation (Non-Medical):   Physical Activity:   . Days of Exercise per Week:   . Minutes of Exercise per Session:   Stress:   . Feeling of  Stress :   Social Connections:   . Frequency of Communication with Friends and Family:   . Frequency of Social Gatherings with Friends and Family:   . Attends Religious Services:   . Active Member of Clubs or Organizations:   . Attends Archivist Meetings:   Marland Kitchen Marital Status:   Intimate Partner Violence:   . Fear of Current or Ex-Partner:   . Emotionally Abused:   Marland Kitchen Physically Abused:   . Sexually Abused:   :  REVIEW OF SYSTEMS: As noted in the HPI.  Exam: Patient Vitals for the past 24 hrs:  BP Temp Temp src Pulse Resp SpO2  12/31/19 1049 -- -- -- -- -- 100 %  12/31/19 0932 (!) 134/51 97.9 F (36.6 C) Oral 80 -- 100 %  12/31/19 0631 (!) 136/52 98 F (36.7 C) -- 85 20 96 %  12/31/19 0550 -- -- -- 82 -- --  12/30/19 2030 (!) 139/56 98.1 F (36.7 C) -- 85 18 97 %    General: Chronically ill-appearing female, no distress  Respiratory: lungs were clear bilaterally without wheezing or crackles. Cardiovascular:  Regular rate and rhythm, S1/S2, without murmur, rub or gallop. There was no pedal edema.   GI:  abdomen was soft, flat, nontender, nondistended, without organomegaly.  Neuro: Alert and oriented, grossly nonfocal  LABS:  Lab Results  Component Value Date   WBC 24.4 (H) 12/30/2019   HGB 10.3 (L) 12/30/2019   HCT 32.2 (L) 12/30/2019   PLT 142 (L) 12/30/2019   GLUCOSE 129 (H) 12/30/2019   CHOL 167 03/23/2018   TRIG 247 (H) 03/23/2018   HDL 25 (L) 03/23/2018   LDLDIRECT 103.0 02/14/2017   LDLCALC 93 03/23/2018   ALT 9 12/30/2019   AST  19 12/30/2019   NA 137 12/30/2019   K 4.0 12/30/2019   CL 106 12/30/2019   CREATININE 1.17 (H) 12/30/2019   BUN 8 12/30/2019   CO2 23 12/30/2019   INR 1.6 (H) 12/30/2019   HGBA1C 5.7 (H) 12/29/2019    CT HEAD WO CONTRAST  Result Date: 12/29/2019 CLINICAL DATA:  79 year old female with altered mental status, generalized weakness and neck pain. EXAM: CT HEAD WITHOUT CONTRAST CT CERVICAL SPINE WITHOUT CONTRAST TECHNIQUE:  Multidetector CT imaging of the head and cervical spine was performed following the standard protocol without intravenous contrast. Multiplanar CT image reconstructions of the cervical spine were also generated. COMPARISON:  03/22/2018 brain MR, 01/29/2014 head CT, 03/25/2018 neck CT and other studies. FINDINGS: CT HEAD FINDINGS Brain: No evidence of acute infarction, hemorrhage, hydrocephalus, extra-axial collection or mass lesion/mass effect. Atrophy and chronic small-vessel white matter ischemic changes again noted. Vascular: Carotid and vertebral atherosclerotic calcifications are noted. Skull: Normal. Negative for fracture or focal lesion. Sinuses/Orbits: No acute abnormality. Chronic RIGHT mastoid effusion is again identified. Other: None CT CERVICAL SPINE FINDINGS Alignment: Normal. Skull base and vertebrae: No acute fracture. No primary bone lesion or focal pathologic process. Soft tissues and spinal canal: No prevertebral fluid or swelling. No visible canal hematoma. Disc levels: Unchanged mild to moderate degenerative disc disease/spondylosis and mild facet arthropathy within the cervical spine. These findings contribute to the following: Mild central spinal narrowing at C3-4 Mild central spinal narrowing at C4-5 Moderate central spinal and biforaminal narrowing at C5-6. Upper chest: Negative. Other: None IMPRESSION: 1. No evidence of acute intracranial abnormality. Atrophy and chronic small-vessel white matter ischemic changes. 2. No static evidence of acute injury to the cervical spine. 3. Degenerative changes as described within significant change since 03/25/2018. Moderate central spinal and biforaminal narrowing at C5-6. 4. Chronic RIGHT mastoid effusion Electronically Signed   By: Margarette Canada M.D.   On: 12/29/2019 10:17   CT CERVICAL SPINE WO CONTRAST  Result Date: 12/29/2019 CLINICAL DATA:  79 year old female with altered mental status, generalized weakness and neck pain. EXAM: CT HEAD WITHOUT  CONTRAST CT CERVICAL SPINE WITHOUT CONTRAST TECHNIQUE: Multidetector CT imaging of the head and cervical spine was performed following the standard protocol without intravenous contrast. Multiplanar CT image reconstructions of the cervical spine were also generated. COMPARISON:  03/22/2018 brain MR, 01/29/2014 head CT, 03/25/2018 neck CT and other studies. FINDINGS: CT HEAD FINDINGS Brain: No evidence of acute infarction, hemorrhage, hydrocephalus, extra-axial collection or mass lesion/mass effect. Atrophy and chronic small-vessel white matter ischemic changes again noted. Vascular: Carotid and vertebral atherosclerotic calcifications are noted. Skull: Normal. Negative for fracture or focal lesion. Sinuses/Orbits: No acute abnormality. Chronic RIGHT mastoid effusion is again identified. Other: None CT CERVICAL SPINE FINDINGS Alignment: Normal. Skull base and vertebrae: No acute fracture. No primary bone lesion or focal pathologic process. Soft tissues and spinal canal: No prevertebral fluid or swelling. No visible canal hematoma. Disc levels: Unchanged mild to moderate degenerative disc disease/spondylosis and mild facet arthropathy within the cervical spine. These findings contribute to the following: Mild central spinal narrowing at C3-4 Mild central spinal narrowing at C4-5 Moderate central spinal and biforaminal narrowing at C5-6. Upper chest: Negative. Other: None IMPRESSION: 1. No evidence of acute intracranial abnormality. Atrophy and chronic small-vessel white matter ischemic changes. 2. No static evidence of acute injury to the cervical spine. 3. Degenerative changes as described within significant change since 03/25/2018. Moderate central spinal and biforaminal narrowing at C5-6. 4. Chronic RIGHT mastoid effusion Electronically  Signed   By: Margarette Canada M.D.   On: 12/29/2019 10:17   CT ABDOMEN PELVIS W CONTRAST  Result Date: 12/28/2019 CLINICAL DATA:  Abdominal distension, nausea and vomiting the past  10 days. Increased weakness and confusion. EXAM: CT ABDOMEN AND PELVIS WITH CONTRAST TECHNIQUE: Multidetector CT imaging of the abdomen and pelvis was performed using the standard protocol following bolus administration of intravenous contrast. CONTRAST:  159m OMNIPAQUE IOHEXOL 300 MG/ML  SOLN COMPARISON:  May 23, 2018. January 04, 2018. January 28, 2014. FINDINGS: Lower chest: Trace pleural fluid and mild bronchitis in the left lower lobe. Mild cardiomegaly. Four-vessel coronary calcification. Partial imaging of a right central vascular catheter port, its tip in the right cardiac atrium. Thoracic aortic calcified atherosclerosis. Hepatobiliary: A partially thrombosed main portal vein extending from its confluence with the mesenteric vein into the proximal portion of the right portal vein occupying approximately 50% of the vessel lumen. No thrombus in these regions on the August 2019 study. Patent right anterior and left portal veins. Cirrhosis with small amount of ascites. A 4 cm heterogeneous parenchyma in the right hepatic dome, incompletely characterized, and possibly secondary to the venous thrombosis or an underlying malignancy. A 5.2 cm gallbladder dilatation with dense sludge or tiny stones layering at the gallbladder neck, which could partially obstruct the cystic duct. No intrahepatic biliary duct dilatation. An 8 mm dilatation of the proximal common bile duct with normal distal tapering. Pancreas: Diffuse moderate atrophy and fatty infiltration of the pancreas. No acute pancreatitis. Spleen: Splenomegaly measuring 15 by 6 by 14 cm, similar. A 9 mm and 10 mm hypoattenuating lesion in the splenic parenchyma, similarly sized compared to March 2019. Although these are incompletely characterized, their stability suggests a benign hemorrhagic or cystic etiology and no further imaging characterization is indicated according to the ACR criteria. Adrenals/Urinary Tract: Punctate nonobstructing right renal calculus.  Stomach/Bowel: Apparent mild diffuse colonic wall thickening that could be due in part to the adjacent ascites. Mild diverticulosis in the distal descending and sigmoid colon. No bowel obstruction. Surgically absent or decompressed appendix. Vascular/Lymphatic: Possible small focal dependent thrombus versus CT telemetry lead artifact in the inferior cava intrahepatic portion with less than 25% luminal narrowing. Patent and ectatic splenic vein. Abdominal aorta calcified atherosclerosis without aneurysmal dilatation. Circumferential calcified atherosclerosis resulting in 75% luminal narrowing of the celiac artery and 50% narrowing of the superior mesenteric artery. An abnormally enlarged 34 by 15 mm periportal lymph node. Clustered interaortocaval and para-aortic nodes measuring up to 12 by 26 mm. Reproductive: Age-appropriate atrophy of the uterine myometrium. A 10 mm endometrial stripe thickness in the sagittal plane, abnormally thickened for an 79year old female. Please note the patient medical record states a history of hysterectomy. Probable bilateral oophorectomy. No adnexal cyst or mass. Other: A 25 mm simple cyst in the right lateral hip subdermal subcutaneous fat plane. No associated inflammatory change or wall thickening. Musculoskeletal: Diffuse bone demineralization and moderate to advanced skeletal degenerative changes most pronounced at the lumbosacral facets. A 13 mm L4 vertebral body hemangioma present since April 2015. I discussed the critical value/emergent results and further recommended imaging by telephone at the time of interpretation on 12/28/2019 at 7:55 pm with provider Dr. MFrancia Greaves, who verbally acknowledged these results. 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 A new diagnosis of nonocclusive thrombosis in the main portal vein extending from its confluence with the mesenteric vein into the proximal portion of the right portal vein occupying approximately 50%  of the  vessel lumen. Small focal nonocclusive thrombus in the intrahepatic inferior vena cava. These may be secondary to the patient's cirrhosis, however, correlation with lab values including an alpha fetoprotein marker may be useful. Partial characterization of heterogeneous parenchyma in the right hepatic dome concerning for a possible hepatocellular carcinoma. Dedicated MR or CT liver protocol imaging without and with intravenous contrast was recommended. Probable recent viral infection with mild bronchitis and trace pleural effusion in the left lower lobe, a mild uncomplicated pancolitis, and gallbladder hydrops, a 5.2 cm gallbladder dilatation with dense sludge or tiny stones layering at the gallbladder neck, which could partially obstruct the cystic duct. If there is concern for acute cholecystitis or cystic duct obstruction, hepatobiliary scintigraphy could be considered. Abnormally thickened endometrial stripe for an 79 year old female. Recommend further gynecological consultation and nonemergent pelvis ultrasonography. A nonspecific abnormally enlarged periportal lymph node and abnormally enlarged retroperitoneal lymph node with clustered smaller nodes, potentially reactive or metastatic. Punctate nonobstructing right renal calculus. Four-vessel coronary calcification. A 25 mm subdermal cyst in the right lateral hip, possibly a sebaceous cyst. No associated inflammatory change. Thoracoabdominal aortic calcified atherosclerosis.  (ICD10-I70.0). Electronically Signed   By: Revonda Humphrey   On: 12/28/2019 20:21   DG Chest Port 1 View  Result Date: 12/28/2019 CLINICAL DATA:  Weakness. EXAM: PORTABLE CHEST 1 VIEW COMPARISON:  August 10, 2019. FINDINGS: Stable cardiomegaly. No pneumothorax or pleural effusion is noted. Right lung is clear. Mild left lingular opacity is noted concerning for atelectasis or possibly infiltrate. Interval placement of right internal jugular Port-A-Cath with distal tip in expected  position of cavoatrial junction. Bony thorax is unremarkable. IMPRESSION: Mild left lingular opacity is noted concerning for atelectasis or possibly infiltrate. Electronically Signed   By: Marijo Conception M.D.   On: 12/28/2019 16:11   CT LIVER ABDOMEN W WO CONTRAST  Result Date: 12/30/2019 CLINICAL DATA:  Inpatient. Cirrhosis. Possible right liver lesion on recent routine CT abdomen/pelvis study. EXAM: CT ABDOMEN WITHOUT AND WITH CONTRAST TECHNIQUE: Multidetector CT imaging of the abdomen was performed following the standard protocol before and following the bolus administration of intravenous contrast. CONTRAST:  171m OMNIPAQUE IOHEXOL 300 MG/ML  SOLN COMPARISON:  12/28/2019 CT abdomen/pelvis. FINDINGS: Lower chest: No significant pulmonary nodules or acute consolidative airspace disease. Superior approach central venous catheter tip seen at the cavoatrial junction. Coronary atherosclerosis. Hepatobiliary: Diffusely markedly irregular liver surface with slight relative hypertrophy of the lateral segment left liver lobe, compatible with advanced cirrhosis. There is heterogeneous density throughout the liver parenchyma on all sequences. On the precontrast sequence, there is an ill-defined geographic region of decreased attenuation throughout the segment 7 right liver lobe (series 2/image 20), without arterial phase hyperenhancement or obvious portal venous washout (this region remains decreased in attenuation compared to the background parenchyma on all sequences), favoring heterogeneous hepatic steatosis. No discrete liver masses, specifically no foci of arterial phase hyperenhancement or portal venous washout in the liver. Distended gallbladder (5.0 cm diameter). Layering small gallstones in the gallbladder. No definite gallbladder wall thickening. No biliary ductal dilatation. CBD diameter 6 mm. Pancreas: Normal, with no mass or duct dilation. Spleen: Mild splenomegaly. Craniocaudal splenic length 13.5 cm. No  splenic mass. Adrenals/Urinary Tract: Normal adrenals. No renal stones. No hydronephrosis. No renal masses. Stomach/Bowel: Small hiatal hernia. Otherwise normal nondistended stomach. Visualized small and large bowel is normal caliber, with no bowel wall thickening. Vascular/Lymphatic: Atherosclerotic nonaneurysmal abdominal aorta. Patent hepatic, splenic and renal veins. Nonocclusive bland appearing thrombosis of the main and right portal  vein, unchanged. Stable small paraumbilical varices. No pathologically enlarged lymph nodes in the abdomen. Other: No pneumoperitoneum. Small volume abdominal ascites, similar. No focal fluid collections. Musculoskeletal: No aggressive appearing focal osseous lesions. Mild thoracolumbar spondylosis. IMPRESSION: 1. Advanced cirrhosis. Ill-defined geographic region of abnormal density in the segment 7 right liver lobe, with features favoring heterogeneous hepatic steatosis. No discrete liver masses, specifically no foci of arterial phase hyperenhancement or portal venous washout in the liver. Close imaging surveillance advised with MRI (strongly preferred) or CT abdomen without and with IV contrast in 3 months. 2. 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 3. Stable small volume abdominal ascites. Mild splenomegaly. Small paraumbilical varices. 4. Cholelithiasis. Distended gallbladder, nonspecific. Correlate with ultrasound if there is clinical concern for acute cholecystitis. No biliary ductal dilatation. 5. Small hiatal hernia. 6. Aortic Atherosclerosis (ICD10-I70.0). Electronically Signed   By: Ilona Sorrel M.D.   On: 12/30/2019 11:09    ASSESSMENT AND PLAN:  1.  [name] 2.  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  Cirrhosis 4.  Leukocytosis, improving 5.  Pancolitis 6.?  Pneumonia 7.  History of CAD 8.  CKD 9.  Diabetes mellitus  -Continue to hold Jakafi given acute illness.  We can look at  resuming this after discharge.  Additionally, she is also having some difficulty swallowing this pill so we will need to revisit this. -She is currently on a heparin drip.  Discussed anticoagulation with Dr. [name].  In the absence of acute bleeding, we would not be opposed to putting her on anticoagulation.  Would recommend transitioning her on Eliquis or Xarelto prior to discharge. -The patient has significant leukocytosis upon admission.  White blood cell count is trending downward.  She had questionable pneumonia noted on the chest x-ray. Consider repeating a chest x-ray she remains on cefepime and metronidazole.  Thank you for this referral.  Mikey Bussing, DNP, AGPCNP-BC, AOCNP   ADDENDUM: I saw Ms. Renbarger this morning.  I really was not aware that she had developed this thrombus in the portal vein.  I think this is all secondary to her cirrhosis.  I do not think this is anything to do with her having polycythemia.  I agree with the heparin drip.  I think this is very reasonable.  I will see a problem with transition her over to one of the new oral anticoagulants.  I probably would use Xarelto.  She has good renal function.  Xarelto was once a day.  I think would be easier for her to take.  She had a paracentesis today.  She has some fluid removed.  Again I think this is all from cirrhosis.  He may be a sign of portal hypertension.  We will follow her along.  She was has had the high white cell count.  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  Again when I last saw her in January, she really looked quite good.  When she takes a "turn" is using is very quick.  I appreciate all the great care that she is getting from all the staff up on 4 [address].  Lattie Haw, MD

## 2019-12-31 NOTE — TOC Initial Note (Signed)
Transition of Care [facility]) - Initial/Assessment Note    Patient Details  Name: Amanda [name] MRN: [id number] Date of Birth: Dec 15, 1940  Transition of Care [facility]) CM/SW Contact:    Dessa Phi, RN Phone Number: 12/31/2019, 1:36 PM  Clinical Narrative:  Patient defers to spouse-d/c plan home w/otpt PT-[facility] has this already set up 3/22 start date. Spouse declines SNF-has gone to [facility] in past-he's not able to be there-therefore decline SNF.Declines HHC. He has own transport home.Informed about private duty care-out of pocket cost-voiced understanding.                Expected Discharge Plan: OP Rehab Barriers to Discharge: Continued Medical Work up   Patient Goals and CMS Choice Patient states their goals for this hospitalization and ongoing recovery are:: To find a solution so the pt can go to therapy, per the spouse.   Choice offered to / list presented to : Spouse  Expected Discharge Plan and Services Expected Discharge Plan: OP Rehab   Discharge Planning Services: CM Consult   Living arrangements for the past 2 months: Single Family Home                                      Prior Living Arrangements/Services Living arrangements for the past 2 months: Single Family Home Lives with:: Spouse   Do you feel safe going back to the place where you live?: Yes      Need for Family Participation in Patient Care: Yes (Comment) Care giver support system in place?: Yes (comment) Current home services: DME(w/c,rw)    Activities of Daily Living Home Assistive Devices/Equipment: Environmental consultant (specify type), Cane (specify quad or straight) ADL Screening (condition at time of admission) Patient's cognitive ability adequate to safely complete daily activities?: Yes Is the patient deaf or have difficulty hearing?: No Does the patient have difficulty seeing, even when wearing glasses/contacts?: No Does the patient have difficulty concentrating, remembering, or making  decisions?: No Patient able to express need for assistance with ADLs?: No Does the patient have difficulty dressing or bathing?: Yes Independently performs ADLs?: No Communication: Independent Dressing (OT): Needs assistance Is this a change from baseline?: Change from baseline, expected to last >3 days Grooming: Needs assistance Is this a change from baseline?: Change from baseline, expected to last >3 days Feeding: Independent Bathing: Needs assistance Is this a change from baseline?: Change from baseline, expected to last >3 days Toileting: Needs assistance Is this a change from baseline?: Change from baseline, expected to last >3days In/Out Bed: Needs assistance Is this a change from baseline?: Change from baseline, expected to last >3 days Walks in Home: Needs assistance Is this a change from baseline?: Change from baseline, expected to last >3 days Does the patient have difficulty walking or climbing stairs?: Yes Weakness of Legs: Both Weakness of Arms/Hands: None  Permission Sought/Granted Permission sought to share information with : Chartered certified accountant granted to share information with : No              Emotional Assessment       Orientation: : Fluctuating Orientation (Suspected and/or reported Sundowners)      Admission diagnosis:  Other ascites [R18.8] SIRS (systemic inflammatory response syndrome) ([place]) [R65.10] Leukocytosis, unspecified type [D72.829] Patient Active Problem List   Diagnosis Date Noted  . SIRS (systemic inflammatory response syndrome) ([place]) 12/28/2019  . Worsening lower extremity weakness 12/14/2019  .  Leg swelling 08/21/2019  . Hypotension due to hypovolemia 08/10/2019  . Acute renal failure superimposed on stage 3 chronic kidney disease ([place]) 08/10/2019  . Acute blood loss anemia 05/23/2018  . Dehydration 03/22/2018  . AKI (acute kidney injury) ([place]) 03/22/2018  . History of CVA (cerebrovascular accident)  03/22/2018  . Hip injury, left, subsequent encounter 11/08/2017  . Iron deficiency anemia 12/22/2016  . OSA (obstructive sleep apnea) 04/21/2016  . Osteopenia 04/07/2016  . Lung nodule, solitary 02/05/2016  . Aortic dilatation ([place]) 02/05/2016  . Dyspnea 01/28/2016  . Cirrhosis ([place]) 01/28/2016  . Allergic rhinitis 01/23/2016  . Hepatic cirrhosis ([place]) 10/28/2015  . Irritable bowel syndrome 08/12/2015  . Obesity (BMI 30-39.9) 04/30/2015  . Diabetic retinopathy of both eyes ([place]) 04/18/2015  . Former smoker 04/18/2015  . GAD (generalized anxiety disorder) 04/18/2015  . Status post primary angioplasty with coronary stent 04/18/2015  . Coronary artery disease involving native coronary artery 03/01/2015  . Chronic diastolic CHF (congestive heart failure) ([place]) 02/01/2015  . ST elevation myocardial infarction (STEMI) involving left circumflex coronary artery in recovery phase ([place]) 01/27/2015  . Idioventricular rhythm ([place])   . Diabetic peripheral neuropathy associated with type 2 diabetes mellitus ([place]) 01/23/2015  . Type 2 diabetes mellitus with diabetic polyneuropathy ([place]) 01/23/2015  . Thrombocytosis ([place]) 07/04/2014  . Cholelithiasis 02/07/2014  . Chronic diarrhea 01/29/2014  . Leukocytosis 06/02/2013  . 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 Essential hypertension 05/31/2013  . History of Bell's palsy 05/26/2013  . DERMATITIS, ATOPIC 11/16/2010  . DIZZINESS 11/16/2010  . DIASTOLIC DYSFUNCTION 35/68/6168  . Hyperlipidemia 12/15/2009  . Essential hypertension, benign 11/24/2009  . PSORIASIS 11/24/2009  . Depression 09/13/2009  . 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[place]) 07/08/2008   PCP:  [name], NP Pharmacy:   [facility] [place], [place] [address] [place] [address] Phone: [phone] Fax: [phone]  [place], [place] [address] [place] [address] Phone: [phone] Fax: [phone]     Social Determinants of Health (SDOH) Interventions    Readmission Risk Interventions No flowsheet data found.

## 2019-12-31 NOTE — Progress Notes (Signed)
PROGRESS NOTE  Amanda [name] PPI:[id number] DOB: 1941/05/18 DOA: 12/28/2019 PCP: [name], NP   LOS: 3 days   Brief narrative: As per HPI,  [name] is a 79 y.o. female with history of CAD status post stent, hypertension, diabetes mellitus type 2, polycythemia vera, cirrhosis of the liver presented to the hospital with increasing weakness weakness over the last 1 month which had progressed to become bedbound over the last week.    Patient also complained of increased nausea, vomiting and diarrhea with the upper abdominal pain.Denied hematemesis. Patient's husband states that over the last 1 week, patient has not taken any of her medications because of the vomiting. ED Course: In the ER, patient's Covid test was negative.  Labs show creatinine 1.1 potassium 2.8, WBC count 44.2, lactic acid of 3 improved to 2.2 after hydration. Chest x-ray showed possible left-sided infiltrates. EKG showed sinus tachycardia. CT abdomen pelvis done showed features concerning for portal vein and hepatic vein thrombosis with possibility of hepatocellular carcinoma changes in the liver and also pancolitis.  Patient was started on empiric antibiotics after cultures were obtained.  Patient was also started on IV heparin for portal vein thrombosis.  Admitted for further management.    Assessment/Plan:  Principal Problem:   SIRS (systemic inflammatory response syndrome) (HCC) Active Problems:   Essential hypertension, benign   [name] (HCC)   Essential hypertension   Leukocytosis   Hepatic cirrhosis (HCC)   Cirrhosis (HCC)   Type 2 diabetes mellitus with diabetic polyneuropathy (HCC)  Pancolitis.  Frequent bouts of diarrhea.  SIRS criteria on presentation.  Chest x-ray with possible infiltrate as well.  On empiric antibiotics with cefepime and metronidazole.  Off vancomycin..  No dyspnea or respiratory symptoms on supplemental oxygen at 2 L/min.  Will try Loperamide.  C. difficile has not been  collected due to the protocol.  Possible left lingular pneumonia with hypoxic respiratory failure..  On supplemental oxygen.  Continue cefepime.    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 Portal vein thrombosis,hepatic vein thrombosis and IVC thrombosis. on IV heparin.  4.  Patient is unable to undergo MRI due to claustrophobia.  CT of the liver with contrast does not show any evidence of liver mass.  AFP of 3.9.  CEA 3.4.  INR elevated at 1.6.  AST ALT within normal limits but bilirubin elevated at 1.8.  Hepatitis panel negative.  GI recommends a diagnostic asciatic tap .  Ultrasound-guided paracentesis ordered.  History of polycythemia vera.  Has significant leukocytosis ,unsure whether this is due to infection.  Holding off Jakafi due to patient has not taken recently.  Patient follows up with Dr. [name] as outpatient.  Generalized weakness.  Could be secondary to volume depletion, dehydration. CT head and C-spine without acute findings.  Degenerative changes in the C-spine.    PT evaluation recommended skilled nursing facility placement.  Acute metabolic encephalopathy.   Possibility of infection, volume depletion, liver disease.At baseline now..  Ammonia was within normal limits.   Discontinue lactulose for diarrhea.  Diarrhea.  Patient was on lactulose and antibiotic.  Lactulose has been discontinued at this time.  De-escalate antibiotic.  C. difficile was not performed as per protocol.  We will try 1 dose of loperamide today due to frequent bouts of diarrhea  History of CAD status post stent.  On Plavix.  Essential hypertension on amlodipine.  Blood pressure improved.  Thickened endometrial stripe seen in the CAT  scan will need OB/GYN follow-up   Hypokalemia.  Improved with replacement.  Potassium of 4.0 today.  Hypophosphatemia.  Improved with replacement.  Phosphorus of 2.7 today.  Chronic kidney disease stage III  appears to be at baseline.  Creatinine of 1.17 today.  Diabetes mellitus type 2 mildly hypoglycemic on presentation. on regular insulin at home.  Latest POC glucose of 127.  Continue sliding scale insulin while in the hospital.  VTE Prophylaxis: Heparin drip  Code Status: DNR  Family Communication:  I spoke with the patient's spouse Mr [name] on the phone and updated him  about the clinical condition.  Spoke briefly about skilled nursing facility placement but he is more in favor of outpatient physical therapy.  Disposition Plan:  . Patient is from home . Likely disposition to SNF in 1 to 2 days.  Transition of care has been consulted. . Barriers to discharge: GI recommendations, IV heparin, IV antibiotics, skilled nursing facility placement, pending asctic tapping   consultants:  GI  Procedures:  None so far  Antibiotics:  . Vanco, 3/12>3/14 . cefepime and metronidazole 3/12>  Anti-infectives (From admission, onward)   Start     Dose/Rate Route Frequency Ordered Stop   12/29/19 2200  vancomycin (VANCOREADY) IVPB 750 mg/150 mL  Status:  Discontinued     750 mg 150 mL/hr over 60 Minutes Intravenous Every 24 hours 12/29/19 0354 12/30/19 1040   12/29/19 0600  ceFEPIme (MAXIPIME) 2 g in sodium chloride 0.9 % 100 mL IVPB     2 g 200 mL/hr over 30 Minutes Intravenous Every 12 hours 12/28/19 2341     12/28/19 2345  metroNIDAZOLE (FLAGYL) IVPB 500 mg     500 mg 100 mL/hr over 60 Minutes Intravenous Every 8 hours 12/28/19 2341     12/28/19 1700  vancomycin (VANCOREADY) IVPB 1750 mg/350 mL     1,750 mg 175 mL/hr over 120 Minutes Intravenous  Once 12/28/19 1617 12/28/19 2150   12/28/19 1615  ceFEPIme (MAXIPIME) 2 g in sodium chloride 0.9 % 100 mL IVPB     2 g 200 mL/hr over 30 Minutes Intravenous  Once 12/28/19 1614 12/28/19 1909   12/28/19 1615  metroNIDAZOLE (FLAGYL) IVPB 500 mg     500 mg 100 mL/hr over 60 Minutes Intravenous  Once 12/28/19 1614 12/28/19 1909   12/28/19 1615   vancomycin (VANCOCIN) IVPB 1000 mg/200 mL premix  Status:  Discontinued     1,000 mg 200 mL/hr over 60 Minutes Intravenous  Once 12/28/19 1614 12/28/19 1617     Subjective:  Today patient feels nauseated and had multiple episodes of loose watery stool.  Patient denies any shortness of breath, cough fever.  Denies abdominal pain.  Objective: Vitals:   12/31/19 0932 12/31/19 1049  BP: (!) 134/51   Pulse: 80   Resp:    Temp: 97.9 F (36.6 C)   SpO2: 100% 100%    Intake/Output Summary (Last 24 hours) at 12/31/2019 1113 Last data filed at 12/31/2019 0819 Gross per 24 hour  Intake 1401.78 ml  Output --  Net 1401.78 ml   Filed Weights   12/28/19 2253  Weight: 96.6 kg   Body mass index is 35.99 kg/m.   Physical Exam:  GENERAL: Patient is alert awake and communicative.    Morbidly obese.  Not in obvious distress.  Nasal cannula in place. HENT: No scleral pallor.Pupils equally reactive to light. Oral mucosa is moist NECK: is supple, no gross swelling noted. CHEST: Clear to auscultation. No crackles  or wheezes.  Diminished breath sounds bilaterally. CVS: S1 and S2 heard, no murmur. Regular rate and rhythm.  ABDOMEN: Soft, non-tender, bowel sounds are present.  Mild abdominal distention. EXTREMITIES: No edema. CNS: Cranial nerves are intact. No focal motor deficits. SKIN: warm and dry without rashes.  Data Review: I have personally reviewed the following laboratory data and studies,  CBC: Recent Labs  Lab 12/28/19 1534 12/29/19 0425 12/30/19 0159 12/30/19 2329  WBC 44.2* 27.2* 27.0* 24.4*  NEUTROABS 35.4* 22.2*  --   --   HGB 13.7 10.5* 9.9* 10.3*  HCT 42.6 33.1* 31.6* 32.2*  MCV 92.4 92.5 93.2 93.9  PLT 266 161 152 423*   Basic Metabolic Panel: Recent Labs  Lab 12/28/19 1534 12/29/19 0425 12/30/19 0159 12/30/19 2329  NA 137 137 138 137  K 2.8* 2.5* 3.4* 4.0  CL 99 103 104 106  CO2 24 24 25 23   GLUCOSE 67* 99 107* 129*  BUN 9 9 9 8   CREATININE 1.18* 1.11*  1.06* 1.17*  CALCIUM 8.9 7.9* 7.9* 8.0*  MG  --   --  1.7 1.7  PHOS  --   --  2.2* 2.7   Liver Function Tests: Recent Labs  Lab 12/28/19 1534 12/29/19 0425 12/30/19 0159 12/30/19 2329  AST 30 21 19 19   ALT 7 6 6 9   ALKPHOS 117 84 84 78  BILITOT 2.0* 1.7* 1.8* 1.4*  PROT 6.6 5.0* 5.0* 5.3*  ALBUMIN 3.5 2.6* 2.6* 2.6*   Recent Labs  Lab 12/28/19 1534  LIPASE 23   Recent Labs  Lab 12/28/19 1535  AMMONIA 56*   Cardiac Enzymes: No results for input(s): CKTOTAL, CKMB, CKMBINDEX, TROPONINI in the last 168 hours. BNP (last 3 results) Recent Labs    08/10/19 1906  BNP 62.7    ProBNP (last 3 results) No results for input(s): PROBNP in the last 8760 hours.  CBG: Recent Labs  Lab 12/30/19 1214 12/30/19 1634 12/30/19 2030 12/31/19 0732 12/31/19 0812  GLUCAP 175* 133* 123* 97 127*   Recent Results (from the past 240 hour(s))  Blood Culture (routine x 2)     Status: None (Preliminary result)   Collection Time: 12/28/19  4:13 PM   Specimen: BLOOD RIGHT ARM  Result Value Ref Range Status   Specimen Description BLOOD RIGHT ARM  Final   Special Requests   Final    BOTTLES DRAWN AEROBIC AND ANAEROBIC Blood Culture adequate volume Performed at [facility], [place] [address] [place], [place] [address]    Culture NO GROWTH 3 DAYS  Final   Report Status PENDING  Incomplete  Respiratory Panel by RT PCR (Flu A&B, Covid) - Nasopharyngeal Swab     Status: None   Collection Time: 12/28/19  4:15 PM   Specimen: Nasopharyngeal Swab  Result Value Ref Range Status   SARS Coronavirus 2 by RT PCR NEGATIVE NEGATIVE Final    Comment: (NOTE) SARS-CoV-2 target nucleic acids are NOT DETECTED. The SARS-CoV-2 RNA is generally detectable in upper respiratoy specimens during the acute phase of infection. The lowest concentration of SARS-CoV-2 viral copies this assay can detect is 131 copies/mL. A negative result does not preclude SARS-Cov-2 infection and should not be  used as the sole basis for treatment or other patient management decisions. A negative result may occur with  improper specimen collection/handling, submission of specimen other than nasopharyngeal swab, presence of viral mutation(s) within the areas targeted by this assay, and inadequate number of viral copies (<131 copies/mL). A  negative result must be combined with clinical observations, patient history, and epidemiological information. The expected result is Negative. Fact Sheet for Patients:  PinkCheek.be Fact Sheet for Healthcare Providers:  GravelBags.it This test is not yet ap proved or cleared by the [place] FDA and  has been authorized for detection and/or diagnosis of SARS-CoV-2 by FDA under an Emergency Use Authorization (EUA). This EUA will remain  in effect (meaning this test can be used) for the duration of the COVID-19 declaration under Section 564(b)(1) of the Act, 21 U.S.C. section 360bbb-3(b)(1), unless the authorization is terminated or revoked sooner.    Influenza A by PCR NEGATIVE NEGATIVE Final   Influenza B by PCR NEGATIVE NEGATIVE Final    Comment: (NOTE) The Xpert Xpress SARS-CoV-2/FLU/RSV assay is intended as an aid in  the diagnosis of influenza from Nasopharyngeal swab specimens and  should not be used as a sole basis for treatment. Nasal washings and  aspirates are unacceptable for Xpert Xpress SARS-CoV-2/FLU/RSV  testing. Fact Sheet for Patients: PinkCheek.be Fact Sheet for Healthcare Providers: GravelBags.it This test is not yet approved or cleared by the [place] FDA and  has been authorized for detection and/or diagnosis of SARS-CoV-2 by  FDA under an Emergency Use Authorization (EUA). This EUA will remain  in effect (meaning this test can be used) for the duration of the  Covid-19 declaration under Section 564(b)(1) of the  Act, 21  U.S.C. section 360bbb-3(b)(1), unless the authorization is  terminated or revoked. Performed at [facility], [place] [address] [place], [place] [address]   Blood Culture (routine x 2)     Status: None (Preliminary result)   Collection Time: 12/29/19  7:47 AM   Specimen: BLOOD  Result Value Ref Range Status   Specimen Description BLOOD BLOOD LEFT HAND  Final   Special Requests   Final    BOTTLES DRAWN AEROBIC AND ANAEROBIC Blood Culture results may not be optimal due to an inadequate volume of blood received in culture bottles Performed at [facility], [place] [address], [place], [place] [address]    Culture NO GROWTH 2 DAYS  Final   Report Status PENDING  Incomplete     Studies: CT LIVER ABDOMEN W WO CONTRAST  Result Date: 12/30/2019 CLINICAL DATA:  Inpatient. Cirrhosis. Possible right liver lesion on recent routine CT abdomen/pelvis study. EXAM: CT ABDOMEN WITHOUT AND WITH CONTRAST TECHNIQUE: Multidetector CT imaging of the abdomen was performed following the standard protocol before and following the bolus administration of intravenous contrast. CONTRAST:  158m OMNIPAQUE IOHEXOL 300 MG/ML  SOLN COMPARISON:  12/28/2019 CT abdomen/pelvis. FINDINGS: Lower chest: No significant pulmonary nodules or acute consolidative airspace disease. Superior approach central venous catheter tip seen at the cavoatrial junction. Coronary atherosclerosis. Hepatobiliary: Diffusely markedly irregular liver surface with slight relative hypertrophy of the lateral segment left liver lobe, compatible with advanced cirrhosis. There is heterogeneous density throughout the liver parenchyma on all sequences. On the precontrast sequence, there is an ill-defined geographic region of decreased attenuation throughout the segment 7 right liver lobe (series 2/image 20), without arterial phase hyperenhancement or obvious portal venous washout (this region remains decreased in  attenuation compared to the background parenchyma on all sequences), favoring heterogeneous hepatic steatosis. No discrete liver masses, specifically no foci of arterial phase hyperenhancement or portal venous washout in the liver. Distended gallbladder (5.0 cm diameter). Layering small gallstones in the gallbladder. No definite gallbladder wall thickening. No biliary ductal dilatation. CBD diameter 6 mm. Pancreas: Normal,  with no mass or duct dilation. Spleen: Mild splenomegaly. Craniocaudal splenic length 13.5 cm. No splenic mass. Adrenals/Urinary Tract: Normal adrenals. No renal stones. No hydronephrosis. No renal masses. Stomach/Bowel: Small hiatal hernia. Otherwise normal nondistended stomach. Visualized small and large bowel is normal caliber, with no bowel wall thickening. Vascular/Lymphatic: Atherosclerotic nonaneurysmal abdominal aorta. Patent hepatic, splenic and renal veins. 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 Stable small paraumbilical varices. No pathologically enlarged lymph nodes in the abdomen. Other: No pneumoperitoneum. Small volume abdominal ascites, similar. No focal fluid collections. Musculoskeletal: No aggressive appearing focal osseous lesions. Mild thoracolumbar spondylosis. IMPRESSION: 1. Advanced cirrhosis. Ill-defined geographic region of abnormal density in the segment 7 right liver lobe, with features favoring heterogeneous hepatic steatosis. No discrete liver masses, specifically no foci of arterial phase hyperenhancement or portal venous washout in the liver. Close imaging surveillance advised with MRI (strongly preferred) or CT abdomen without and with IV contrast in 3 months. 2. 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 3. Stable small volume abdominal ascites. Mild splenomegaly. Small paraumbilical varices. 4. Cholelithiasis. Distended gallbladder, nonspecific. Correlate with ultrasound if  there is clinical concern for acute cholecystitis. No biliary ductal dilatation. 5. Small hiatal hernia. 6. Aortic Atherosclerosis (ICD10-I70.0). Electronically Signed   By: Ilona Sorrel M.D.   On: 12/30/2019 11:09      Flora Lipps, MD  [facility] 12/31/2019

## 2019-12-31 NOTE — Progress Notes (Signed)
ANTICOAGULATION CONSULT NOTE - Follow Up Consult  Pharmacy Consult for heparin Indication: portal vein thrombosis  Allergies  Allergen Reactions  . Penicillins Swelling    Has patient had a PCN reaction causing immediate rash, facial/tongue/throat swelling, SOB or lightheadedness with hypotension: No Has patient had a PCN reaction causing severe rash involving mucus membranes or skin necrosis: No Has patient had a PCN reaction that required hospitalization: No Has patient had a PCN reaction occurring within the last 10 years: No If all of the above answers are "NO", then may proceed with Cephalosporin use.  Marland Kitchen Lisinopril Cough  . Tape Hives  . Doxycycline Hives, Swelling and Rash  . Latex Hives, Itching and Rash    Patient Measurements: Height: 5' 4.5" (163.8 cm) Weight: 212 lb 15.4 oz (96.6 kg) IBW/kg (Calculated) : 55.85 Heparin Dosing Weight: 79 y.o  Vital Signs: Temp: 98 F (36.7 C) (03/15 0631) BP: 136/52 (03/15 0631) Pulse Rate: 85 (03/15 0631)  Labs: Recent Labs    12/28/19 1534 12/28/19 1534 12/28/19 1535 12/29/19 0425 12/29/19 0425 12/29/19 0748 12/29/19 1805 12/30/19 0159 12/30/19 2329  HGB 13.7   < >  --  10.5*   < >  --   --  9.9* 10.3*  HCT 42.6   < >  --  33.1*  --   --   --  31.6* 32.2*  PLT 266   < >  --  161  --   --   --  152 142*  LABPROT  --   --   --   --   --   --   --  19.1*  --   INR  --   --   --   --   --   --   --  1.6*  --   HEPARINUNFRC  --   --   --   --   --    < > 0.33 0.33 <0.10*  CREATININE 1.18*   < >  --  1.11*  --   --   --  1.06* 1.17*  TROPONINIHS 9  --  12  --   --   --   --   --   --    < > = values in this interval not displayed.    Estimated Creatinine Clearance: 43.7 mL/min (A) (by C-G formula based on SCr of 1.17 mg/dL (H)).    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 Abdominal CT on 3/12  showed cirrhosis with small volume ascites, portal hypertension, and nonocclusive portal vein thrombosis.  Patient was started on heparin drip on admission for thrombosis.  Today, 12/31/2019: - Heparin level result did not cross-over from [employer] to Epic this morning.  Verify with Lab tech Elmyra Ricks) - level this morning was undetectable at < 0.10. Per pt's RN, no issue with IV line and no bleeding noted - no bleeding documented  Goal of Therapy:  Heparin level 0.3-0.7 units/ml Monitor platelets by anticoagulation protocol: Yes   Plan:  - increase heparin drip to 1350 units/hr - check 8 hr heparin level - monitor for s/s bleeding  Dyani Babel P 12/31/2019,7:54 AM

## 2019-12-31 NOTE — ED Provider Notes (Signed)
I saw and evaluated the patient, reviewed the resident's note and I agree with the findings and plan.  EKG: EKG Interpretation  Date/Time:  Friday December 28 2019 14:29:55 EST Ventricular Rate:  102 PR Interval:    QRS Duration: 99 QT Interval:  342 QTC Calculation: 446 R Axis:   -42 Text Interpretation: Sinus tachycardia Ventricular premature complex Abnormal R-wave progression, early transition Inferior infarct, old Consider anterior infarct No significant change since last tracing Confirmed by Wandra Arthurs ([id number]) on 12/28/2019 2:37:10 PM  Amanda [name] is a 79 y.o. female hx of cirrhosis with ascites, here with AMS, poor appetite for a week. Patient is confused. Has distended abdomen with fluid wave. Bedside US showed small ascites, not enough to perform paracentesis. Abdomen nontender and I doubt SBP. WBC is 44. CXR showed pneumonia. Ordered broad spectrum abx. CT ab/pel pending at sign out. Anticipate admission for sepsis, pneumonia.   CRITICAL CARE Performed by: Wandra Arthurs   Total critical care time: 30 minutes  Critical care time was exclusive of separately billable procedures and treating other patients.  Critical care was necessary to treat or prevent imminent or life-threatening deterioration.  Critical care was time spent personally by me on the following activities: development of treatment plan with patient and/or surrogate as well as nursing, discussions with consultants, evaluation of patient's response to treatment, examination of patient, obtaining history from patient or surrogate, ordering and performing treatments and interventions, ordering and review of laboratory studies, ordering and review of radiographic studies, pulse oximetry and re-evaluation of patient's condition.     Drenda Freeze, MD 12/31/19 1500

## 2019-12-31 NOTE — Progress Notes (Signed)
Initial Nutrition Assessment  DOCUMENTATION CODES:   Obesity unspecified  INTERVENTION:  - will order Boost Breeze po TID, each supplement provides 250 kcal and 9 grams of protein - will order daily multivitamin with minerals. - will monitor for needs once nausea is controlled.    NUTRITION DIAGNOSIS:   Inadequate oral intake related to acute illness, nausea, vomiting, diarrhea as evidenced by per patient/family report, meal completion < 25%.  GOAL:   Patient will meet greater than or equal to 90% of their needs  MONITOR:   PO intake, Supplement acceptance, Labs, Weight trends, I & O's  REASON FOR ASSESSMENT:   Malnutrition Screening Tool  ASSESSMENT:   79 y.o. female with history of CAD s/p stent, HTN, type 2 DM, polycythemia vera, and cirrhosis of the liver. She presented to the ED with increased weakness x1 month which progressed to the point of her being bedbound for the past 1 week. She reported N/V/D and upper abdominal pain. CXR showed possible L-sided infiltrates, CT abdomen/pelvis showed concern for portal vein and hepatic vein thrombosis, pancolitis, and possible hepatocellular carcinoma.  Per flow sheet documentation, patient consumed 0% of dinner last night and 0% of breakfast today. Patient is a/o to self and place. She did not provide any relevant information and all information provided by patient's husband, who was at bedside, and nurse, who was also at bedside.   Patient has had a decrease in appetite over the past 1 month with very marked decreased in the past 1.5 weeks. During that time she would take a few bites or sips of items a few times/day even of items that she usually really likes. She was having difficulty swallowing pills so she stopped taking them 1-1.5 weeks PTA. She was not having any difficulties swallowing other items. She did not have any chewing difficulties. She has been having ongoing N/V/D for the past 1.5 weeks and this has persisted up to  this time. Nurse reports that patient was given zofran prior to shift change this AM and she was getting ready to provide scheduled zofran at the time of RD visit ~1 hour ago. These symptoms have led to a lack of ability and disinterest in eating.   Per chart review, weight on 3/12 was 213 lb. This is the highest weight over the past 4 months. Weight on 08/13/19 was 226 lb. This would indicate 13 lb weight loss (5.7% body weight) in the past 4.5 months; not significant for time frame.  Per notes: - pancolitis with persistent diarrhea, multiple bouts/day - SIRS - possible L lingular PNA with hypoxic respiratory failure - small volume ascites and portal HTN--u/s-guided paracentesis ordered - acute metabolic encephalopathy - CT liver with contrast does not show any evidence of liver mass   Labs reviewed; CBGs: 97, 127, 140 mg/dl, creatinine: 1.17 mg/dl, Ca: 8 mg/dl, GFR: 44 ml/min. Medications reviewed; 1 capsule risaquad/day, sliding scale novolog, 40 mEq Klor-Con/day, 30 mmol IV KPhos x1 run 3/14.     NUTRITION - FOCUSED PHYSICAL EXAM:  unable to complete at this time.  Diet Order:   Diet Order            Diet heart healthy/carb modified Room service appropriate? Yes; Fluid consistency: Thin  Diet effective now              EDUCATION NEEDS:   Not appropriate for education at this time  Skin:  Skin Assessment: Reviewed RN Assessment  Last BM:  3/15  Height:   Ht Readings  from Last 1 Encounters:  12/28/19 5' 4.5" (1.638 m)    Weight:   Wt Readings from Last 1 Encounters:  12/28/19 96.6 kg    Estimated Nutritional Needs:  Kcal:  5110-2111 kcal Protein:  100-115 grams Fluid:  >/= 2.3 L/day     Jarome Matin, MS, RD, LDN, CNSC Inpatient Clinical Dietitian RD pager # available in [facility]  After hours/weekend pager # available in [facility]

## 2020-01-01 ENCOUNTER — Telehealth: Payer: Self-pay | Admitting: Cardiovascular Disease

## 2020-01-01 LAB — GLUCOSE, CAPILLARY
Glucose-Capillary: 138 mg/dL — ABNORMAL HIGH (ref 70–99)
Glucose-Capillary: 167 mg/dL — ABNORMAL HIGH (ref 70–99)
Glucose-Capillary: 164 mg/dL — ABNORMAL HIGH (ref 70–99)
Glucose-Capillary: 112 mg/dL — ABNORMAL HIGH (ref 70–99)

## 2020-01-01 LAB — CBC
HCT: 32.1 % — ABNORMAL LOW (ref 36.0–46.0)
Hemoglobin: 10 g/dL — ABNORMAL LOW (ref 12.0–15.0)
MCH: 29.2 pg (ref 26.0–34.0)
MCHC: 31.2 g/dL (ref 30.0–36.0)
MCV: 93.6 fL (ref 80.0–100.0)
Platelets: 154 10*3/uL (ref 150–400)
RBC: 3.43 MIL/uL — ABNORMAL LOW (ref 3.87–5.11)
RDW: 20.4 % — ABNORMAL HIGH (ref 11.5–15.5)
WBC: 25.6 10*3/uL — ABNORMAL HIGH (ref 4.0–10.5)
nRBC: 0.2 % (ref 0.0–0.2)

## 2020-01-01 LAB — HEPARIN LEVEL (UNFRACTIONATED): Heparin Unfractionated: 1.06 IU/mL — ABNORMAL HIGH (ref 0.30–0.70)

## 2020-01-01 LAB — PHOSPHORUS: Phosphorus: 2.4 mg/dL — ABNORMAL LOW (ref 2.5–4.6)

## 2020-01-01 LAB — MAGNESIUM: Magnesium: 1.7 mg/dL (ref 1.7–2.4)

## 2020-01-01 MED ORDER — APIXABAN 5 MG PO TABS: 5.0000 mg | ORAL_TABLET | Freq: Two times a day (BID) | ORAL | Status: DC

## 2020-01-01 MED ORDER — MIRTAZAPINE 15 MG PO TABS
30.0000 mg | ORAL_TABLET | Freq: Every day | ORAL | Status: DC
  Administered 2020-01-01: 21:00:00 30 mg via ORAL
  Filled 2020-01-01: qty 2

## 2020-01-01 MED ORDER — LORAZEPAM 1 MG PO TABS
1.0000 mg | ORAL_TABLET | Freq: Four times a day (QID) | ORAL | Status: DC | PRN
  Administered 2020-01-01: 1 mg via ORAL
  Filled 2020-01-01: qty 1

## 2020-01-01 MED ORDER — ACETAMINOPHEN 325 MG PO TABS: 650.0000 mg | ORAL_TABLET | Freq: Four times a day (QID) | ORAL | Status: DC | PRN

## 2020-01-01 MED ORDER — PANTOPRAZOLE SODIUM 40 MG PO TBEC
40.0000 mg | DELAYED_RELEASE_TABLET | Freq: Every day | ORAL | Status: DC
  Administered 2020-01-02: 40 mg via ORAL
  Filled 2020-01-01 (×2): qty 1

## 2020-01-01 MED ORDER — APIXABAN 5 MG PO TABS
10.0000 mg | ORAL_TABLET | Freq: Two times a day (BID) | ORAL | Status: DC
  Administered 2020-01-01 – 2020-01-02 (×3): 10 mg via ORAL
  Filled 2020-01-01: qty 4
  Filled 2020-01-01 (×2): qty 2

## 2020-01-01 MED ORDER — HEPARIN (PORCINE) 25000 UT/250ML-% IV SOLN: 1250.0000 [IU]/h | INTRAVENOUS | Status: AC

## 2020-01-01 MED ORDER — OXYCODONE-ACETAMINOPHEN 5-325 MG PO TABS: 1.0000 | ORAL_TABLET | Freq: Three times a day (TID) | ORAL | Status: DC | PRN

## 2020-01-01 NOTE — Telephone Encounter (Signed)
Will route to MD to advise if Plavix should continue?  Primary nurse notified.  Thanks!

## 2020-01-01 NOTE — Telephone Encounter (Signed)
   Pt c/o medication issue:  1. Name of Medication:  clopidogrel (PLAVIX) tablet 75 mg    2. How are you currently taking this medication (dosage and times per day)?   3. Are you having a reaction (difficulty breathing--STAT)?   4. What is your medication issue? [name] called, Pt is in the hospital and had blood clot in her liver. The hospital gave her eliquis, he was advised by the hospital doctor to ask Dr. [name] if the pt need to continue her plavix   Please call

## 2020-01-01 NOTE — Progress Notes (Signed)
ANTICOAGULATION CONSULT NOTE - Follow Up Consult  Pharmacy Consult for heparin Indication: portal vein thrombosis  Allergies  Allergen Reactions  . Penicillins Swelling    Has patient had a PCN reaction causing immediate rash, facial/tongue/throat swelling, SOB or lightheadedness with hypotension: No Has patient had a PCN reaction causing severe rash involving mucus membranes or skin necrosis: No Has patient had a PCN reaction that required hospitalization: No Has patient had a PCN reaction occurring within the last 10 years: No If all of the above answers are "NO", then may proceed with Cephalosporin use.  Marland Kitchen Lisinopril Cough  . Tape Hives  . Doxycycline Hives, Swelling and Rash  . Latex Hives, Itching and Rash   Patient Measurements: Height: 5' 4.5" (163.8 cm) Weight: 212 lb 15.4 oz (96.6 kg) IBW/kg (Calculated) : 55.85 Heparin Dosing Weight: 78kg  Vital Signs: Temp: 98.1 F (36.7 C) (03/16 0226) BP: 132/59 (03/16 0226) Pulse Rate: 92 (03/16 0226)  Labs: Recent Labs    12/30/19 0159 12/30/19 0159 12/30/19 2329 12/30/19 2329 12/31/19 1330 12/31/19 1630 01/01/20 0415  HGB 9.9*   < > 10.3*   < > 10.3*  --  10.0*  HCT 31.6*   < > 32.2*  --  32.6*  --  32.1*  PLT 152   < > 142*  --  149*  --  154  LABPROT 19.1*  --   --   --   --   --   --   INR 1.6*  --   --   --   --   --   --   HEPARINUNFRC 0.33   < > <0.10*  --   --  0.20* 1.06*  CREATININE 1.06*  --  1.17*  --  1.01*  --   --    < > = values in this interval not displayed.   Estimated Creatinine Clearance: 50.6 mL/min (A) (by C-G formula based on SCr of 1.01 mg/dL (H)).  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 Abdominal CT on 3/12 showed cirrhosis with small volume ascites, portal hypertension, and nonocclusive portal vein thrombosis. Patient was started on heparin drip on admission for  thrombosis.  Significant Events:  3/15: lost peripheral access at 1740. IV access re-established and heparin resumed at 2002.   Today, 01/01/2020:  AM heparin level = 1.06 units/mL, now supratherapeutic  CBC: Hgb 10 low/stable, Pltc WNL  No bleeding or infusion issues noted per nursing  Per RN, heparin drip infusing in R hand, heparin level drawn from port   Goal of Therapy:  Heparin level 0.3-0.7 units/ml Monitor platelets by anticoagulation protocol: Yes   Plan:   Stop heparin infusion x 1 hour, then resume at lower rate of 1250 units/hr  Heparin level 8 hours after resuming heparin   Daily CBC, heparin level  Monitor closely for s/sx of bleeding   Lindell Spar, PharmD, BCPS Clinical Pharmacist  01/01/2020,6:07 AM

## 2020-01-01 NOTE — Progress Notes (Signed)
ANTICOAGULATION CONSULT NOTE - Follow Up Consult  Pharmacy Consult for heparin--> Eliquis Indication: portal vein thrombosis  Allergies  Allergen Reactions  . Penicillins Swelling    Has patient had a PCN reaction causing immediate rash, facial/tongue/throat swelling, SOB or lightheadedness with hypotension: No Has patient had a PCN reaction causing severe rash involving mucus membranes or skin necrosis: No Has patient had a PCN reaction that required hospitalization: No Has patient had a PCN reaction occurring within the last 10 years: No If all of the above answers are "NO", then may proceed with Cephalosporin use.  Marland Kitchen Lisinopril Cough  . Tape Hives  . Doxycycline Hives, Swelling and Rash  . Latex Hives, Itching and Rash    Patient Measurements: Height: 5' 4.5" (163.8 cm) Weight: 212 lb 15.4 oz (96.6 kg) IBW/kg (Calculated) : 55.85 Heparin Dosing Weight: 79 y.o  Vital Signs: Temp: 98 F (36.7 C) (03/16 1105) Temp Source: Oral (03/16 1105) BP: 105/77 (03/16 1105) Pulse Rate: 87 (03/16 1105)  Labs: Recent Labs    12/30/19 0159 12/30/19 0159 12/30/19 2329 12/30/19 2329 12/31/19 1330 12/31/19 1630 01/01/20 0415  HGB 9.9*   < > 10.3*   < > 10.3*  --  10.0*  HCT 31.6*   < > 32.2*  --  32.6*  --  32.1*  PLT 152   < > 142*  --  149*  --  154  LABPROT 19.1*  --   --   --   --   --   --   INR 1.6*  --   --   --   --   --   --   HEPARINUNFRC 0.33   < > <0.10*  --   --  0.20* 1.06*  CREATININE 1.06*  --  1.17*  --  1.01*  --   --    < > = values in this interval not displayed.    Estimated Creatinine Clearance: 50.6 mL/min (A) (by C-G formula based on SCr of 1.01 mg/dL (H)).    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 Abdominal CT on 3/12 showed cirrhosis with small volume ascites, portal hypertension, and nonocclusive portal vein thrombosis.   Patient was started on heparin drip on admission for thrombosis and changed to Eliquis on 3/16  Today, 01/01/2020: - cbc stable - no bleeding documented - child-pugh class B  Goal of Therapy:  Heparin level 0.3-0.7 units/ml Monitor platelets by anticoagulation protocol: Yes   Plan:  - d/c  heparin drip - start Eliquis 10 mg PO bid x7 days, then 5 mg bid - monitor for s/s bleeding  Sawsan Riggio P 01/01/2020,12:32 PM

## 2020-01-01 NOTE — Progress Notes (Signed)
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 Abdominal CT on 3/12 showed cirrhosis with small volume ascites, portal hypertension, nonocclusive portal vein thrombosis, and questionable acute cholecystitis.  She's currently on cefepime and flagyl for empiric coverage for sepsis/pancolitis.  -  3/15: paracentesis  Today, 01/01/2020: - day #5 abx - afeb, wbc elevated but trending down (with cbc on 3/15) - scr 1.01 (crcl~50) - all cultures have been negative this far  Plan: - continue cefepime 2gm IV q12h and flagyl 500 mg IV q8h - f/u clinical status and plan for abx  _______________________________________  Height: 5' 4.5" (163.8 cm) Weight: 212 lb 15.4 oz (96.6 kg) IBW/kg (Calculated) : 55.85  Temp (24hrs), Avg:98 F (36.7 C), Min:97.9 F (36.6 C), Max:98.1 F (36.7 C)  Recent Labs  Lab 12/28/19 1534 12/28/19 1534 12/28/19 1613 12/28/19 1813 12/29/19 0425 12/30/19 0159 12/30/19 2329 12/31/19 1330 01/01/20 0415  WBC 44.2*   < >  --   --  27.2* 27.0* 24.4* 26.7* 25.6*  CREATININE 1.18*  --   --   --  1.11* 1.06* 1.17* 1.01*  --   LATICACIDVEN  --   --  3.0* 2.2*  --   --   --   --   --    < > = values in this interval not displayed.    Estimated Creatinine Clearance: 50.6 mL/min (A) (by C-G formula based on SCr of 1.01 mg/dL (H)).    Allergies  Allergen Reactions  . Penicillins Swelling    Has patient had a PCN reaction causing immediate rash, facial/tongue/throat swelling, SOB or lightheadedness with hypotension: No Has patient had a PCN reaction causing severe rash involving mucus membranes or skin necrosis: No Has patient had a PCN reaction that required hospitalization: No Has patient had a PCN reaction occurring within the last 10 years: No If all of the above answers are "NO", then may proceed with Cephalosporin use.  Marland Kitchen  Lisinopril Cough  . Tape Hives  . Doxycycline Hives, Swelling and Rash  . Latex Hives, Itching and Rash    Antimicrobials this admission:  3/13 Vancomycin  >> 3/14 3/12 Cefepime  >>  3/12 flagyl>>  Microbiology results:  3/12 BCx x2:  3/12 resp panel PCR: neg for covid and influ 3/15 peritoneal fluid: Thank you for allowing pharmacy to be a part of this patient's care.  Lynelle Doctor 01/01/2020 7:45 AM

## 2020-01-01 NOTE — Progress Notes (Signed)
Patient ID: Amanda [name], female   DOB: 1941/08/16, 79 y.o.   MRN: [id number]    Progress Note   Subjective   Day # 4  CC; altered mental status, lethargy, nausea vomiting  Paracentesis yesterday-1.5 L removed, counts not consistent with SBP  IV heparin-switched to Eliquis yesterday per hospitalist IV Flagyl and IV cefepime  Labs today-WBC 25.6, hemoglobin 10 Phosphorus 2.4 Ammonia 42 yesterday No chemistries today Blood cultures negative  Chest x-ray yesterday-mild bibasilar atelectasis- no pneumonia  Patient sitting up watching TV, brighter today, mentating well.  Says she wants to go home.  She denies any pain but remains nauseated and has not been able to take any p.o.'s.  She has not been out of bed.     Objective   Vital signs in last 24 hours: Temp:  [97.9 F (36.6 C)-98.1 F (36.7 C)] 97.9 F (36.6 C) (03/16 0812) Pulse Rate:  [79-105] 79 (03/16 0812) Resp:  [20-21] 20 (03/16 0812) BP: (114-159)/(48-82) 138/65 (03/16 0812) SpO2:  [97 %-99 %] 99 % (03/16 0812) Last BM Date: 12/31/19 General: Elderly   white female in NAD, husband at bedside Heart:  Regular rate and rhythm; no murmurs Lungs: Respirations even and unlabored, lungs CTA bilaterally Abdomen:  Soft, obese, nontender and nondistended. Normal bowel sounds. Extremities:  Without edema. Neurologic:  Alert and oriented,  grossly normal neurologically.  No asterixis Psych:  Cooperative. Normal mood and affect.  Intake/Output from previous day: 03/15 0701 - 03/16 0700 In: 1367.4 [P.O.:720; I.V.:264.2; IV Piggyback:383.2] Out: -  Intake/Output this shift: Total I/O In: 240 [P.O.:240] Out: -   Lab Results: Recent Labs    12/30/19 2329 12/31/19 1330 01/01/20 0415  WBC 24.4* 26.7* 25.6*  HGB 10.3* 10.3* 10.0*  HCT 32.2* 32.6* 32.1*  PLT 142* 149* 154   BMET Recent Labs    12/30/19 0159 12/30/19 2329 12/31/19 1330  NA 138 137 137  K 3.4* 4.0 3.6  CL 104 106 105  CO2 25 23 22   GLUCOSE  107* 129* 164*  BUN 9 8 8   CREATININE 1.06* 1.17* 1.01*  CALCIUM 7.9* 8.0* 8.1*   LFT Recent Labs    12/31/19 1330  PROT 5.2*  ALBUMIN 2.7*  AST 18  ALT 6  ALKPHOS 89  BILITOT 1.2   PT/INR Recent Labs    12/30/19 0159  LABPROT 19.1*  INR 1.6*    Studies/Results: DG Chest 2 View  Result Date: 12/31/2019 CLINICAL DATA:  Post paracentesis. Pneumonia. Altered mental status EXAM: CHEST - 2 VIEW COMPARISON:  12/28/2019 FINDINGS: Cardiac enlargement without heart failure or edema. No significant pleural effusion. Atherosclerotic aortic arch Port-A-Cath tip in the SVC. Negative for pneumonia.  Mild bibasilar atelectasis. IMPRESSION: Mild bibasilar atelectasis.  Negative for pneumonia. Electronically Signed   By: Franchot Gallo M.D.   On: 12/31/2019 16:03   US Paracentesis  Result Date: 12/31/2019 INDICATION: Patient with history NASH cirrhosis, colitis, chronic kidney disease, coronary artery disease, ascites. Request made for diagnostic and therapeutic paracentesis. EXAM: ULTRASOUND GUIDED DIAGNOSTIC AND THERAPEUTIC PARACENTESIS MEDICATIONS: None COMPLICATIONS: None immediate. PROCEDURE: Informed written consent was obtained from the patient after a discussion of the risks, benefits and alternatives to treatment. A timeout was performed prior to the initiation of the procedure. Initial ultrasound scanning demonstrates a small to moderate amount of ascites within the left mid to lower abdominal quadrant. The left mid to lower abdomen was prepped and draped in the usual sterile fashion. 1% lidocaine was used for local anesthesia.  Following this, a 19 gauge, 10-cm, Yueh catheter was introduced. An ultrasound image was saved for documentation purposes. The paracentesis was performed. The catheter was removed and a dressing was applied. The patient tolerated the procedure well without immediate post procedural complication. FINDINGS: A total of approximately 1.5 liters of yellow fluid was removed.  Samples were sent to the laboratory as requested by the clinical team. IMPRESSION: Successful ultrasound-guided diagnostic and therapeutic paracentesis yielding 1.5 liters of peritoneal fluid. Read by: Rowe [name], PA-C Electronically Signed   By: Jacqulynn Cadet M.D.   On: 12/31/2019 15:30       Assessment / Plan:    #38 79 year old white female with decompensated cirrhosis felt secondary to [facility] admitted after ill at home for 1.5 weeks with lethargy, mild confusion and acute nausea and vomiting for 2 days prior to admit. Patient met SIRS criteria on admission, source still unclear. Marked leukocytosis may have been related to polycythemia vera-patient off oral meds for a week and a half at home due to acute illness.  Currently back to her relative baseline leukocytosis  Cultures negative, Paracentesis yesterday, no SBP Repeat chest x-ray yesterday no pneumonia Bowel edema noted on initial imaging but with repeat imaging no bowel edema noted, patient has not had any significant diarrhea and C. difficile, ordered but never obtained as no significant diarrhea  Question continue IV antibiotics x5 days total empirically then DC  #2 hepatic encephalopathy-associated with acute illness-resolved, will continue low-dose lactulose  #3 partial portal vein thrombosis-unclear acute versus chronic-she had been heparinized, switched to Eliquis yesterday.  Per hematology notes would prefer Xarelto  #4 persistent nausea and lack of appetite continue Zofran, will add PPI.  #5 coronary artery disease-status post stents x2 approximately 5 years ago, followed by Dr. [name] and had been maintained on Plavix  Do not think patient should be discharged on Xarelto and Plavix especially given decompensated cirrhosis.  Will message cardiology/Dr. [name] for opinion  #6-debilitation-start physical therapy to mobilize    Principal Problem:   SIRS (systemic inflammatory response syndrome) (HCC) Active  Problems:   Essential hypertension, benign   [name] (HCC)   Essential hypertension   Leukocytosis   Hepatic cirrhosis (HCC)   Cirrhosis (HCC)   Type 2 diabetes mellitus with diabetic polyneuropathy (HCC)   Portal vein thrombosis     LOS: 4 days   Denis Carreon EsterwoodPA-C  01/01/2020, 11:00 AM

## 2020-01-01 NOTE — Telephone Encounter (Signed)
Ok to dc plavix if on eliquis

## 2020-01-01 NOTE — Discharge Instructions (Signed)
Information on my medicine - ELIQUIS (apixaban)  This medication education was reviewed with me or my healthcare representative as part of my discharge preparation.    Why was Eliquis prescribed for you? Eliquis was prescribed to treat blood clots that may have been found in the veins of your legs (deep vein thrombosis) or in your lungs (pulmonary embolism) and to reduce the risk of them occurring again.  What do You need to know about Eliquis ? The starting dose is 10 mg (two 5 mg tablets) taken TWICE daily for the FIRST SEVEN (7) DAYS, then on 01/08/2020  the dose is reduced to ONE 5 mg tablet taken TWICE daily.  Eliquis may be taken with or without food.   Try to take the dose about the same time in the morning and in the evening. If you have difficulty swallowing the tablet whole please discuss with your pharmacist how to take the medication safely.  Take Eliquis exactly as prescribed and DO NOT stop taking Eliquis without talking to the doctor who prescribed the medication.  Stopping may increase your risk of developing a new blood clot.  Refill your prescription before you run out.  After discharge, you should have regular check-up appointments with your healthcare provider that is prescribing your Eliquis.    What do you do if you miss a dose? If a dose of ELIQUIS is not taken at the scheduled time, take it as soon as possible on the same day and twice-daily administration should be resumed. The dose should not be doubled to make up for a missed dose.  Important Safety Information A possible side effect of Eliquis is bleeding. You should call your healthcare provider right away if you experience any of the following: ? Bleeding from an injury or your nose that does not stop. ? Unusual colored urine (red or dark brown) or unusual colored stools (red or black). ? Unusual bruising for unknown reasons. ? A serious fall or if you hit your head (even if there is no  bleeding).  Some medicines may interact with Eliquis and might increase your risk of bleeding or clotting while on Eliquis. To help avoid this, consult your healthcare provider or pharmacist prior to using any new prescription or non-prescription medications, including herbals, vitamins, non-steroidal anti-inflammatory drugs (NSAIDs) and supplements.  This website has more information on Eliquis (apixaban): http://www.eliquis.com/eliquis/home

## 2020-01-01 NOTE — Progress Notes (Signed)
PROGRESS NOTE  Amanda [name] YHC:[id number] DOB: 04-17-1941 DOA: 12/28/2019 PCP: [name], NP   LOS: 4 days   Brief narrative: As per HPI,  Amanda [name] is a 79 y.o. female with history of CAD status post stent, hypertension, diabetes mellitus type 2, polycythemia vera, cirrhosis of the liver presented to the hospital with increasing weakness for1 month which had progressed to become bedbound over the last week.    Patient also complained of increased nausea, vomiting and diarrhea with the upper abdominal pain.Denied hematemesis. Patient's husband states that over the last 1 week, patient has not taken any of her medications because of the vomiting. ED Course: In the ER, patient's Covid test was negative.  Labs show creatinine 1.1 potassium 2.8, WBC count 44.2, lactic acid of 3 improved to 2.2 after hydration. Chest x-ray showed possible left-sided infiltrates. EKG showed sinus tachycardia. CT abdomen pelvis done showed features concerning for portal vein and hepatic vein thrombosis with possibility of hepatocellular carcinoma changes in the liver and also pancolitis.  Patient was started on empiric antibiotics after cultures were obtained.  Patient was also started on IV heparin for portal vein thrombosis.  Patient was then admitted for further management.    Assessment/Plan:  Principal Problem:   SIRS (systemic inflammatory response syndrome) (HCC) Active Problems:   Essential hypertension, benign   [name] (HCC)   Essential hypertension   Leukocytosis   Hepatic cirrhosis (HCC)   Cirrhosis (HCC)   Type 2 diabetes mellitus with diabetic polyneuropathy (HCC)   Portal vein thrombosis  Pancolitis with diarrhea.  Frequent bouts of diarrhea on presentation.has improved at this time.  C. difficile has not been collected.  SIRS criteria on presentation.  on cefepime and metronidazole.  Loperamide as needed.Marland Kitchen   Possible left lingular pneumonia with hypoxic respiratory failure..   On supplemental oxygen.  Continue to wean as able.  Continue cefepime for now..  Repeated chest x-ray shows possible bibasilar atelectasis.  Leukocytosis was significant on presentation which is trended down.  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  Status post paracentesis with no evidence of SBP.  Approximately 1.5 L of yellow fluid was removed.  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  Oncology on board and do not think it is likely secondary to polycythemia.  Could switch to oral Xarelto as per oncology.   Patient was unable to undergo MRI due to claustrophobia.  CT of the liver with contrast does not show any evidence of liver mass.  AFP of 3.9.  CEA 3.4.  INR elevated at 1.6.  LFTs improving.  Hepatitis panel negative.   History of polycythemia vera.  Improving leukocytosis.  On antibiotic.  Holding off Jakafi due to patient has not taken recently due to possibility of acute infection.  This could be started as outpatient.  Seen by Dr. [name] oncology during hospitalization.  Generalized weakness.  Could be secondary to volume depletion, dehydration. CT head and C-spine without acute findings.  PT evaluation recommended skilled nursing facility placement but patient has refused at this time..  Acute metabolic encephalopathy.   Possibility of infection, volume depletion, liver disease.At baseline now..  Ammonia was within normal limits.   Discontinue lactulose for diarrhea.  Patient is at her baseline  History of CAD status post stent.  On Plavix.  We will continue  Essential hypertension on amlodipine.  Blood pressure improved.  Thickened  endometrial stripe seen in the CAT scan will need OB/GYN follow-up   Hypokalemia.  Improved with replacement.   Hypophosphatemia.  Improved  Chronic kidney disease stage III appears to be at baseline.  Creatinine  of 1.1 today.  Diabetes mellitus type 2 mildly hypoglycemic on presentation, on regular insulin at home.  Latest POC glucose of 138.  Continue sliding scale insulin while in the hospital.  VTE Prophylaxis: Heparin drip-consider changing to oral Xarelto  Code Status: DNR  Family Communication: None today.  I spoke with the patient's spouse Mr [name] on the phone yesterday.  Disposition Plan:  . Patient is from home . Likely disposition to home with home health likely by tomorrow.  Patient and family refusing skilled nursing facility.  . Barriers to discharge: GI and oncology recommendations, , IV antibiotics, on supplemental oxygen  consultants:  GI  Oncology  IR  Procedures:  Ultrasound guided asctic fluid tapping on 12/31/2019.  Antibiotics:  . Vanco, 3/12>3/14 . cefepime and metronidazole 3/12>  Anti-infectives (From admission, onward)   Start     Dose/Rate Route Frequency Ordered Stop   12/29/19 2200  vancomycin (VANCOREADY) IVPB 750 mg/150 mL  Status:  Discontinued     750 mg 150 mL/hr over 60 Minutes Intravenous Every 24 hours 12/29/19 0354 12/30/19 1040   12/29/19 0600  ceFEPIme (MAXIPIME) 2 g in sodium chloride 0.9 % 100 mL IVPB     2 g 200 mL/hr over 30 Minutes Intravenous Every 12 hours 12/28/19 2341     12/28/19 2345  metroNIDAZOLE (FLAGYL) IVPB 500 mg     500 mg 100 mL/hr over 60 Minutes Intravenous Every 8 hours 12/28/19 2341     12/28/19 1700  vancomycin (VANCOREADY) IVPB 1750 mg/350 mL     1,750 mg 175 mL/hr over 120 Minutes Intravenous  Once 12/28/19 1617 12/28/19 2150   12/28/19 1615  ceFEPIme (MAXIPIME) 2 g in sodium chloride 0.9 % 100 mL IVPB     2 g 200 mL/hr over 30 Minutes Intravenous  Once 12/28/19 1614 12/28/19 1909   12/28/19 1615  metroNIDAZOLE (FLAGYL) IVPB 500 mg     500 mg 100 mL/hr over 60 Minutes Intravenous  Once 12/28/19 1614 12/28/19 1909   12/28/19 1615  vancomycin (VANCOCIN) IVPB 1000 mg/200 mL premix  Status:  Discontinued      1,000 mg 200 mL/hr over 60 Minutes Intravenous  Once 12/28/19 1614 12/28/19 1617     Subjective:  Today, patient feels nauseated.  Her diarrhea has improved.  Denies abdominal pain, chest pain, shortness of breath.  Denies urinary issues.  Objective: Vitals:   01/01/20 0812 01/01/20 1105  BP: 138/65 105/77  Pulse: 79 87  Resp: 20 20  Temp: 97.9 F (36.6 C) 98 F (36.7 C)  SpO2: 99% 97%    Intake/Output Summary (Last 24 hours) at 01/01/2020 1117 Last data filed at 01/01/2020 0900 Gross per 24 hour  Intake 1547.36 ml  Output --  Net 1547.36 ml   Filed Weights   12/28/19 2253  Weight: 96.6 kg   Body mass index is 35.99 kg/m.   Physical Exam: General: Morbidly obese,, not in obvious distress, HENT: Normocephalic, pupils equally reacting to light and accommodation.  No scleral pallor or icterus noted. Oral mucosa is moist.  Chest:  Clear breath sounds.  Diminished breath sounds bilaterally. No crackles or wheezes.  CVS: S1 &S2 heard. No murmur.  Regular rate and rhythm. Abdomen: Soft, nontender, mildly distended.  Bowel sounds are heard.  Extremities:  No cyanosis, clubbing or edema.  Peripheral pulses are palpable. Psych: Alert, awake and oriented, normal mood CNS:  No cranial nerve deficits.  Power equal in all extremities.   Skin: Warm and dry.  No rashes noted.  Data Review: I have personally reviewed the following laboratory data and studies,  CBC: Recent Labs  Lab 12/28/19 1534 12/28/19 1534 12/29/19 0425 12/30/19 0159 12/30/19 2329 12/31/19 1330 01/01/20 0415  WBC 44.2*   < > 27.2* 27.0* 24.4* 26.7* 25.6*  NEUTROABS 35.4*  --  22.2*  --   --  22.1*  --   HGB 13.7   < > 10.5* 9.9* 10.3* 10.3* 10.0*  HCT 42.6   < > 33.1* 31.6* 32.2* 32.6* 32.1*  MCV 92.4   < > 92.5 93.2 93.9 93.7 93.6  PLT 266   < > 161 152 142* 149* 154   < > = values in this interval not displayed.   Basic Metabolic Panel: Recent Labs  Lab 12/28/19 1534 12/29/19 0425 12/30/19 0159  12/30/19 2329 12/31/19 1330 01/01/20 0415  NA 137 137 138 137 137  --   K 2.8* 2.5* 3.4* 4.0 3.6  --   CL 99 103 104 106 105  --   CO2 24 24 25 23 22   --   GLUCOSE 67* 99 107* 129* 164*  --   BUN 9 9 9 8 8   --   CREATININE 1.18* 1.11* 1.06* 1.17* 1.01*  --   CALCIUM 8.9 7.9* 7.9* 8.0* 8.1*  --   MG  --   --  1.7 1.7  --  1.7  PHOS  --   --  2.2* 2.7  --  2.4*   Liver Function Tests: Recent Labs  Lab 12/28/19 1534 12/29/19 0425 12/30/19 0159 12/30/19 2329 12/31/19 1330  AST 30 21 19 19 18   ALT 7 6 6 9 6   ALKPHOS 117 84 84 78 89  BILITOT 2.0* 1.7* 1.8* 1.4* 1.2  PROT 6.6 5.0* 5.0* 5.3* 5.2*  ALBUMIN 3.5 2.6* 2.6* 2.6* 2.7*   Recent Labs  Lab 12/28/19 1534  LIPASE 23   Recent Labs  Lab 12/28/19 1535 12/31/19 1515  AMMONIA 56* 42*   Cardiac Enzymes: No results for input(s): CKTOTAL, CKMB, CKMBINDEX, TROPONINI in the last 168 hours. BNP (last 3 results) Recent Labs    08/10/19 1906  BNP 62.7    ProBNP (last 3 results) No results for input(s): PROBNP in the last 8760 hours.  CBG: Recent Labs  Lab 12/31/19 0812 12/31/19 1137 12/31/19 1625 12/31/19 2258 01/01/20 0808  GLUCAP 127* 140* 153* 131* 138*   Recent Results (from the past 240 hour(s))  Blood Culture (routine x 2)     Status: None (Preliminary result)   Collection Time: 12/28/19  4:13 PM   Specimen: BLOOD RIGHT ARM  Result Value Ref Range Status   Specimen Description   Final    BLOOD RIGHT ARM Performed at [facility] [address] [place], [place] [address]    Special Requests   Final    BOTTLES DRAWN AEROBIC AND ANAEROBIC Blood Culture adequate volume Performed at [facility] [address], [place], [place] [address]    Culture   Final    NO GROWTH 4 DAYS Performed at [facility], [place] [address] [place], [place] [address]    Report Status PENDING  Incomplete  Respiratory Panel by RT PCR (Flu A&B, Covid) - Nasopharyngeal Swab  Status: None   Collection Time: 12/28/19  4:15 PM   Specimen: Nasopharyngeal Swab  Result Value Ref Range Status   SARS Coronavirus 2 by RT PCR NEGATIVE NEGATIVE Final    Comment: (NOTE) SARS-CoV-2 target nucleic acids are NOT DETECTED. The SARS-CoV-2 RNA is generally detectable in upper respiratoy specimens during the acute phase of infection. The lowest concentration of SARS-CoV-2 viral copies this assay can detect is 131 copies/mL. A negative result does not preclude SARS-Cov-2 infection and should not be used as the sole basis for treatment or other patient management decisions. A negative result may occur with  improper specimen collection/handling, submission of specimen other than nasopharyngeal swab, presence of viral mutation(s) within the areas targeted by this assay, and inadequate number of viral copies (<131 copies/mL). A negative result must be combined with clinical observations, patient history, and epidemiological information. The expected result is Negative. Fact Sheet for Patients:  PinkCheek.be Fact Sheet for Healthcare Providers:  GravelBags.it This test is not yet ap proved or cleared by the [place] FDA and  has been authorized for detection and/or diagnosis of SARS-CoV-2 by FDA under an Emergency Use Authorization (EUA). This EUA will remain  in effect (meaning this test can be used) for the duration of the COVID-19 declaration under Section 564(b)(1) of the Act, 21 U.S.C. section 360bbb-3(b)(1), unless the authorization is terminated or revoked sooner.    Influenza A by PCR NEGATIVE NEGATIVE Final   Influenza B by PCR NEGATIVE NEGATIVE Final    Comment: (NOTE) The Xpert Xpress SARS-CoV-2/FLU/RSV assay is intended as an aid in  the diagnosis of influenza from Nasopharyngeal swab specimens and  should not be used as a sole basis for treatment. Nasal washings and  aspirates are unacceptable for  Xpert Xpress SARS-CoV-2/FLU/RSV  testing. Fact Sheet for Patients: PinkCheek.be Fact Sheet for Healthcare Providers: GravelBags.it This test is not yet approved or cleared by the [place] FDA and  has been authorized for detection and/or diagnosis of SARS-CoV-2 by  FDA under an Emergency Use Authorization (EUA). This EUA will remain  in effect (meaning this test can be used) for the duration of the  Covid-19 declaration under Section 564(b)(1) of the Act, 21  U.S.C. section 360bbb-3(b)(1), unless the authorization is  terminated or revoked. Performed at [facility], [place] [address], [place], [place] [address]   Blood Culture (routine x 2)     Status: None (Preliminary result)   Collection Time: 12/29/19  7:47 AM   Specimen: BLOOD LEFT HAND  Result Value Ref Range Status   Specimen Description   Final    BLOOD LEFT HAND Performed at [facility], [place] [address], [place], [place] [address]    Special Requests   Final    BOTTLES DRAWN AEROBIC AND ANAEROBIC Blood Culture results may not be optimal due to an inadequate volume of blood received in culture bottles Performed at [facility] [address], [place], [place] [address]    Culture   Final    NO GROWTH 3 DAYS Performed at [facility], [place] [address] [place], [place] [address]    Report Status PENDING  Incomplete  Culture, body fluid-bottle     Status: None (Preliminary result)   Collection Time: 12/31/19  3:30 PM   Specimen: Fluid  Result Value Ref Range Status   Specimen Description FLUID PERITONEAL  Final   Special Requests BOTTLES DRAWN AEROBIC AND ANAEROBIC 10CC  Final   Culture  Final    NO GROWTH < 12 HOURS Performed at [facility] [address], [place], [place] [address]    Report Status PENDING  Incomplete  Gram stain     Status: None   Collection Time: 12/31/19  3:30 PM   Specimen:  Fluid  Result Value Ref Range Status   Specimen Description FLUID PERITONEAL  Final   Special Requests NONE  Final   Gram Stain   Final    MODERATE WBC PRESENT,BOTH PMN AND MONONUCLEAR NO ORGANISMS SEEN Performed at [facility], [address] [place], [place] [address]    Report Status 12/31/2019 FINAL  Final     Studies: DG Chest 2 View  Result Date: 12/31/2019 CLINICAL DATA:  Post paracentesis. Pneumonia. Altered mental status EXAM: CHEST - 2 VIEW COMPARISON:  12/28/2019 FINDINGS: Cardiac enlargement without heart failure or edema. No significant pleural effusion. Atherosclerotic aortic arch Port-A-Cath tip in the SVC. Negative for pneumonia.  Mild bibasilar atelectasis. IMPRESSION: Mild bibasilar atelectasis.  Negative for pneumonia. Electronically Signed   By: Franchot Gallo M.D.   On: 12/31/2019 16:03   US Paracentesis  Result Date: 12/31/2019 INDICATION: Patient with history NASH cirrhosis, colitis, chronic kidney disease, coronary artery disease, ascites. Request made for diagnostic and therapeutic paracentesis. EXAM: ULTRASOUND GUIDED DIAGNOSTIC AND THERAPEUTIC PARACENTESIS MEDICATIONS: None COMPLICATIONS: None immediate. PROCEDURE: Informed written consent was obtained from the patient after a discussion of the risks, benefits and alternatives to treatment. A timeout was performed prior to the initiation of the procedure. Initial ultrasound scanning demonstrates a small to moderate amount of ascites within the left mid to lower abdominal quadrant. The left mid to lower abdomen was prepped and draped in the usual sterile fashion. 1% lidocaine was used for local anesthesia. Following this, a 19 gauge, 10-cm, Yueh catheter was introduced. An ultrasound image was saved for documentation purposes. The paracentesis was performed. The catheter was removed and a dressing was applied. The patient tolerated the procedure well without immediate post procedural complication. FINDINGS: A  total of approximately 1.5 liters of yellow fluid was removed. Samples were sent to the laboratory as requested by the clinical team. IMPRESSION: Successful ultrasound-guided diagnostic and therapeutic paracentesis yielding 1.5 liters of peritoneal fluid. Read by: Rowe [name], PA-C Electronically Signed   By: Jacqulynn Cadet M.D.   On: 12/31/2019 15:30      Flora Lipps, MD  [facility] 01/01/2020

## 2020-01-02 DIAGNOSIS — Z794 Long term (current) use of insulin: Secondary | ICD-10-CM

## 2020-01-02 DIAGNOSIS — E1142 Type 2 diabetes mellitus with diabetic polyneuropathy: Secondary | ICD-10-CM

## 2020-01-02 DIAGNOSIS — I10 Essential (primary) hypertension: Secondary | ICD-10-CM

## 2020-01-02 LAB — COMPREHENSIVE METABOLIC PANEL
ALT: 7 U/L (ref 0–44)
AST: 19 U/L (ref 15–41)
Albumin: 2.6 g/dL — ABNORMAL LOW (ref 3.5–5.0)
Alkaline Phosphatase: 77 U/L (ref 38–126)
Anion gap: 5 (ref 5–15)
BUN: 12 mg/dL (ref 8–23)
CO2: 22 mmol/L (ref 22–32)
Calcium: 8.1 mg/dL — ABNORMAL LOW (ref 8.9–10.3)
Chloride: 110 mmol/L (ref 98–111)
Creatinine, Ser: 1.1 mg/dL — ABNORMAL HIGH (ref 0.44–1.00)
GFR calc Af Amer: 55 mL/min — ABNORMAL LOW (ref >60)
GFR calc non Af Amer: 47 mL/min — ABNORMAL LOW (ref >60)
Glucose, Bld: 115 mg/dL — ABNORMAL HIGH (ref 70–99)
Potassium: 3.7 mmol/L (ref 3.5–5.1)
Sodium: 137 mmol/L (ref 135–145)
Total Bilirubin: 0.9 mg/dL (ref 0.3–1.2)
Total Protein: 4.8 g/dL — ABNORMAL LOW (ref 6.5–8.1)

## 2020-01-02 LAB — CULTURE, BLOOD (ROUTINE X 2)
Culture: NO GROWTH
Special Requests: ADEQUATE

## 2020-01-02 LAB — GLUCOSE, CAPILLARY
Glucose-Capillary: 125 mg/dL — ABNORMAL HIGH (ref 70–99)
Glucose-Capillary: 145 mg/dL — ABNORMAL HIGH (ref 70–99)

## 2020-01-02 LAB — CYTOLOGY - NON PAP

## 2020-01-02 LAB — MAGNESIUM: Magnesium: 1.8 mg/dL (ref 1.7–2.4)

## 2020-01-02 MED ORDER — PANTOPRAZOLE SODIUM 40 MG PO TBEC: 40.0000 mg | DELAYED_RELEASE_TABLET | Freq: Every day | ORAL | 1 refills | Status: DC

## 2020-01-02 MED ORDER — HEPARIN SOD (PORK) LOCK FLUSH 100 UNIT/ML IV SOLN
500.0000 [IU] | INTRAVENOUS | Status: AC | PRN
  Administered 2020-01-02: 500 [IU]
  Filled 2020-01-02: qty 5

## 2020-01-02 MED ORDER — LACTULOSE 10 GM/15ML PO SOLN: 20.0000 g | Freq: Every day | ORAL | 2 refills | Status: DC

## 2020-01-02 MED ORDER — APIXABAN (ELIQUIS) VTE STARTER PACK (10MG AND 5MG): ORAL_TABLET | ORAL | 0 refills | Status: DC

## 2020-01-02 NOTE — Telephone Encounter (Signed)
Spoke with husband who had called in earlier and notified of Dr.Kelly's recommendation that we are unable to make that decision at this time because we are not sure if she will need to be on the Eliquis for longer than the 2 months that the hospital has prescribed. Notified that he should call in as that time approaches and update [place] on her treatment. Husband verbalized understanding.

## 2020-01-02 NOTE — Progress Notes (Signed)
Amanda [name] is really about the same.  She really wants to go home.  I really doubt that she is capable of being home and getting the care that she needs.  It does not sound like she is getting out of bed.  She is not walking.  This cirrhosis really is causing her problems.  I probably would check an ammonia level on her.  There is no infection in the ascites.  Is now on Eliquis.  She has the portal vein thrombus.  I know she does have the myeloproliferative disorder.  She does have the polycythemia.  I suppose this might be a factor with the portal vein thrombus.  However, I think the bigger reason is her cirrhosis.  Her CBC is not back yet.  Her chemistry studies show an albumin of 2.6.  Her ammonia 2 days ago was 42.  Her blood sugar is not too bad at 115.  On her exam, her vital signs all seems stable.  Her temperature is 97.9.  Pulse 83.  Blood pressure 126/55.  Her skin exam shows a lot of ecchymoses.  Her lungs sound pretty clear bilaterally.  Oral exam does not show any thrush.  Cardiac exam regular rate and rhythm.  She has occasional extra beat.  Abdomen is somewhat distended.  It is soft.  It is hard to palpate a liver or spleen tip.  Extremities show some mild edema in her lower legs.  Neurological exam shows no obvious neurological deficits.  Amanda [name] I think is really going to have issues with this cirrhosis.  Apparently, this is worsening on her.  She has some ascites.  Her albumin is less than 3.  Again I realize she has this myeloproliferative disorder.  It pretty stable from my point of view.  Her she does have a leukocytosis which is a was chronic.  It is hard to say if she will go home.  Again I am not sure if she can be cared for at home.  I know her husband is very dedicated to her but I am unsure he is capable of dealing with the issues.  I know that she is getting fantastic care from all the staff up [address].  Lattie Haw, MD  Psalm 37:4

## 2020-01-02 NOTE — Progress Notes (Signed)
Pt to be discharged to home this afternoon. Pt and [name] given discharge teaching including Medications and schedules for these Medications. Understanding verbalized by both Pt and [name] [name]. Discharge packet with Pt and [name] at time of discharge

## 2020-01-02 NOTE — Progress Notes (Signed)
Physical Therapy Treatment Patient Details Name: Amanda [name] MRN: [id number] DOB: 1940-12-23 Today's Date: 01/02/2020    History of Present Illness 79 year old female admitted with SIRs; PMH includes CAD (s/p stents) , DM2, polycyehemia vera, cirrhosis of liver.    PT Comments    Patient resting in bed when PT and tech arrived. Did agree to get out of bed to [facility] chair. She requires assist of 2 for all bed mobility and is incontinent of bowel and bladder- states she does not know when she is going either way, "but wears panties at home". When asked if she had been doing any of the exercise on the sheet provided , she said "NO". (Had discussed the need to encourage her in this directly with spouse). She was able to sit on edge of bed for @ 4 minutes prior to sit<>stand with verbal and tactile cues for postural awareness. Mod of 2 for sit<> stand, cues for proper use of RW- 8 steps bed to BS chair, broad based shuffling gait. Once in chair, LEs elevated, chair alarm activated. Practiced SAQs and ankle pumps and reminded to perform same as well as the full ex HEP sheet at her bedside. Remote/call light and cell phone, hospital phone in reach. Blanket over lap, and BS table over lap. Should benefit from PT while hospitalized, and while she wants to go directly home, she and spouse neither presents as having good insight into her current situation. She requires 24/7 supervision and assistance with all ADLs. Generally weak with limited ambulation ability currently.    Follow Up Recommendations  SNF(She does not want SNF, but she currently is not a candidate for outpatient PT as she is 24/7 supervision and requires assistance for all ADLs)     Equipment Recommendations  Rolling walker with 5" wheels    Recommendations for Other Services       Precautions / Restrictions Precautions Precautions: Fall Precaution Comments: Monitor for s/s of nausea, diarrhea/ vomiting. Restrictions Other  Position/Activity Restrictions: Cleared to be seen for treatment. She is ademate to go home and she and husband neither one presents as having good insight into her medical situation. Currently 24/7 supervision and is incontinent of bowel and bladder.  States she does not know when she is going, and wears panties at home (?)    Mobility  Bed Mobility Overal bed mobility: Needs Assistance Bed Mobility: Rolling;Sidelying to Sit;Supine to Sit;Sit to Supine;Sit to Sidelying Rolling: Mod assist;+2 for physical assistance Sidelying to sit: Mod assist;+2 for physical assistance Supine to sit: Mod assist;Max assist;+2 for physical assistance     General bed mobility comments: Once in seated, sat on edge of bed for 4 minutes to prep for sit<>stand with RW.  Transfers Overall transfer level: Needs assistance Equipment used: Rolling walker (2 wheeled) Transfers: Sit to/from Stand Sit to Stand: Mod assist;+2 physical assistance            Ambulation/Gait Ambulation/Gait assistance: Mod assist;+2 physical assistance Gait Distance (Feet): 8 Feet Assistive device: Rolling walker (2 wheeled) Gait Pattern/deviations: Wide base of support;Shuffle   Gait velocity interpretation: <1.8 ft/sec, indicate of risk for recurrent falls     Stairs             Wheelchair Mobility    Modified Rankin (Stroke Patients Only)       Balance Overall balance assessment: Needs assistance Sitting-balance support: Bilateral upper extremity supported Sitting balance-Leahy Scale: Fair   Postural control: Posterior lean;Left lateral lean(But could correct  with verbal and tactile cues) Standing balance support: Bilateral upper extremity supported Standing balance-Leahy Scale: Fair                              Cognition Arousal/Alertness: Awake/alert Behavior During Therapy: Anxious Overall Cognitive Status: Within Functional Limits for tasks assessed                                  General Comments: Reminded to perform ex format as per printed sheet. Encouraged to do ankle pumps/circles frequently. Admittedly she had not been doing any ex or for the most part moving about in bed.      Exercises Total Joint Exercises Ankle Circles/Pumps: AROM;Seated;10 reps(2 sets of 10.) Short Arc Quad: AROM;10 reps;Seated(2 sets of 10 reps) Other Exercises Other Exercises: Printed sheet was issued to spouse and full instructions given in presence of patient. Encouraged her to perform ankle pumps and circles frequently - and to move her LEs as much as possible. Printed sheet at bedside, and she states she has not been doing any of these as reminded.    General Comments General comments (skin integrity, edema, etc.): Monitor skin and joint integrity as tone and turgor is fair- prone to skin tears and bruising. She presents with incontinence and states she "does not know when she is going" Bowel and bladder. Relates that she wears panties at home.      Pertinent Vitals/Pain Pain Score: 3  Pain Location: Abdomen Pain Descriptors / Indicators: Aching;Constant    Home Living                      Prior Function            PT Goals (current goals can now be found in the care plan section) Acute Rehab PT Goals Patient Stated Goal: Just want to get well and go home PT Goal Formulation: With patient Time For Goal Achievement: 01/13/20 Potential to Achieve Goals: Good Progress towards PT goals: Progressing toward goals(slow , generalized weakness, low endurance- today is first day she has been OOB with PT)    Frequency    Min 3X/week      PT Plan Current plan remains appropriate    Co-evaluation              AM-PAC PT "6 Clicks" Mobility   Outcome Measure  Help needed turning from your back to your side while in a flat bed without using bedrails?: A Lot Help needed moving from lying on your back to sitting on the side of a flat bed without  using bedrails?: A Lot Help needed moving to and from a bed to a chair (including a wheelchair)?: A Lot Help needed standing up from a chair using your arms (e.g., wheelchair or bedside chair)?: A Lot Help needed to walk in hospital room?: A Lot Help needed climbing 3-5 steps with a railing? : A Lot 6 Click Score: 12    End of Session   Activity Tolerance: Other (comment)(Generalized weakness and low endurance) Patient left: in chair;with call bell/phone within reach;with chair alarm set Nurse Communication: Mobility status PT Visit Diagnosis: Muscle weakness (generalized) (M62.81);Other abnormalities of gait and mobility (R26.89)     Time: 1540-0867 PT Time Calculation (min) (ACUTE ONLY): 32 min  Charges:  $Therapeutic Exercise: 8-22 mins $Therapeutic Activity: 8-22 mins  Rollen Sox, PT # [id number] CGV cell  Casandra Doffing 01/02/2020, 9:52 AM

## 2020-01-02 NOTE — Telephone Encounter (Signed)
Need to address whether or not this is necessary depending upon patient of [facility]

## 2020-01-02 NOTE — Discharge Summary (Signed)
Physician Discharge Summary  Amanda [name]:[id number] DOB: 16-Dec-1940 DOA: 12/28/2019  PCP: Amanda [name]  Admit date: 12/28/2019 Discharge date: 01/02/2020  Admitted From: Home  Discharge disposition: Home  Recommendations for Outpatient Follow-Up:   . Follow up with your primary care provider in one week.  Patient will need a refill for Eliquis.  She has been prescribed DVT starter pack while in the hospital.  CT scan showed some thickening of the endometrial stripe.  This might need to be followed up as outpatient . Check CBC, BMP, LFT in the next visit. .  Follow-up with oncology Dr. [name] in 2 to 3 weeks for polycythemia vera. .  Plavix has been discontinued as per cardiology and patient has been started on Eliquis.   . Follow up with GI doctor Dr. Edison Nasuti in 2 to 3 weeks for cirrhosis follow-up.  Discharge Diagnosis:   Principal Problem:   SIRS (systemic inflammatory response syndrome) (HCC) Active Problems:   Essential hypertension, benign   [name] (HCC)   Essential hypertension   Leukocytosis   Hepatic cirrhosis (HCC)   Cirrhosis (HCC)   Type 2 diabetes mellitus with diabetic polyneuropathy (HCC)   Portal vein thrombosis   Discharge Condition: Improved.  Diet recommendation: Low sodium, heart healthy.  Carbohydrate-modified.    Wound care: None.  Code status: DNR   History of Present Illness:   Amanda Freehling Kleinis a 79 y.o.femalewithhistory of CAD status post stent, hypertension, diabetes mellitus type 2, polycythemia vera, cirrhosis of the liver presented to the hospital with increasing weakness for1 month which had progressed to become bedbound over the last week.   Patient also complained of increased nausea, vomiting and diarrhea with the upper abdominal pain.Denied hematemesis. Patient's husband states that over the last 1 week, patient has not taken any of her medications because of the vomiting.   ED Course:In the ER, patient's  Covid test was negative. Labs show creatinine 1.1 potassium 2.8, WBC count 44.2, lactic acid of 3 improved to 2.2 after hydration. Chest x-ray showed possible left-sided infiltrates. EKG showed sinus tachycardia. CT abdomen pelvis done showed features concerning for portal vein and hepatic vein thrombosis with possibility of hepatocellular carcinoma changes in the liver and also pancolitis. Patient was started on empiric antibiotics after cultures were obtained. Patient was also started on IV heparin for portal vein thrombosis. Patient was then admitted for further management.    Hospital Course:   Following conditions were addressed during hospitalization as listed below,  Pancolitis with diarrhea.   improved at this time.  SIRS criteria on presentation.    Received  cefepime and metronidazole.  No obvious source has been identified so far so antibiotics have been discontinued.  Possible left lingular pneumonia with hypoxic respiratory failure.    Patient initially required supplemental oxygen.  Has been weaned off the oxygen at this time.  Repeat chest x-ray showed possible bibasilar atelectasis.  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  Status post paracentesis with no evidence of SBP.  Approximately 1.5 L of yellow fluid was removed.  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  Subsequently patient was changed to Eliquis..  Oncology was consulted and the impression was that the portal vein thrombosis was secondary to cirrhosis of liver not secondary to polycythemia.  Patient was  unable to undergo MRI due to claustrophobia.  CT of the liver with contrast did not show any evidence of liver mass.  AFP of 3.9.  CEA 3.4.  Hepatitis panel negative.    History of polycythemia vera.  Improving leukocytosis.    Patient was initially on empiric antibiotic    Seen by Dr. [name] oncology during hospitalization.  Okay to restart Jakafi on discharge.  Generalized weakness.  Could be secondary to volume depletion, dehydration. CT head and C-spine without acute findings.  PT evaluation recommended skilled nursing facility placement but patient and the patient's spouse has refused at this time..  Acute metabolic encephalopathy.     Improved.  Thought to be secondary to infection volume depletion liver disease.  At baseline now..  Ammonia was within normal limits.    Will resume lactulose on discharge to titrate for bowel movements 2 times a day.  History of CAD status post stent. Patient was on Plavix.  Since the patient will be started on Eliquis Plavix will be discontinued okay with cardiology.  Essential hypertension on amlodipine.   Thickened endometrial stripe seen in the CAT scan will need OB/GYN follow-up/outpatient follow-up.  Hypokalemia.  Improved with replacement.   Hypophosphatemia.  Improved  Chronic kidney disease stage III appears to be at baseline.  Creatinine of 1.1 today.  Diabetes mellitus type 2  on regular insulin at home.  Latest POC glucose of 145.Marland Kitchen    Disposition.  At this time, patient is stable for disposition home.  PT has recommended skilled nursing facility but family has refused despite multiple conversations.  Medical Consultants:    Oncology  GI  Procedures:    Ultrasound-guided asctic fluid tapping on 12/31/2019  Subjective:   Today, patient feels overall okay.  Denies any shortness of breath, cough, abdominal pain nausea or vomiting.  Has generalized weakness.  Discharge Exam:   Vitals:   01/02/20 1011 01/02/20 1344  BP: (!) 148/62 140/63  Pulse: 76 76  Resp: 18   Temp: 98.1 F (36.7 C) 98 F (36.7 C)  SpO2:  99%   Vitals:   01/02/20 0523 01/02/20 0937 01/02/20 1011 01/02/20  1344  BP: (!) 126/55  (!) 148/62 140/63  Pulse: 83  76 76  Resp: 16  18   Temp: 97.9 F (36.6 C)  98.1 F (36.7 C) 98 F (36.7 C)  TempSrc:   Oral Oral  SpO2: 96% 97%  99%  Weight:      Height:       General: Morbidly obese, not in obvious distress HENT: pupils equally reacting to light,  No scleral pallor or icterus noted. Oral mucosa is moist.  Chest:  Clear breath sounds.  Diminished breath sounds bilaterally. No crackles or wheezes.  CVS: S1 &S2 heard. No murmur.  Regular rate and rhythm. Abdomen: Soft, nontender, mildly distended.  Bowel sounds are heard.   Extremities: No cyanosis, clubbing or edema.  Peripheral pulses are palpable. Psych: Alert, awake and communicative. CNS:  No cranial nerve deficits.  Power equal in all extremities.   Skin: Warm and dry.  No rashes noted.  The results of significant diagnostics from this hospitalization (including imaging, microbiology, ancillary and laboratory) are listed below for reference.     Diagnostic Studies:   CT HEAD WO CONTRAST  Result Date: 12/29/2019 CLINICAL DATA:  79 year old female with altered mental status, generalized weakness and neck pain. EXAM: CT HEAD WITHOUT CONTRAST CT CERVICAL SPINE WITHOUT CONTRAST TECHNIQUE: Multidetector CT imaging of the head and  cervical spine was performed following the standard protocol without intravenous contrast. Multiplanar CT image reconstructions of the cervical spine were also generated. COMPARISON:  03/22/2018 brain MR, 01/29/2014 head CT, 03/25/2018 neck CT and other studies. FINDINGS: CT HEAD FINDINGS Brain: No evidence of acute infarction, hemorrhage, hydrocephalus, extra-axial collection or mass lesion/mass effect. Atrophy and chronic small-vessel white matter ischemic changes again noted. Vascular: Carotid and vertebral atherosclerotic calcifications are noted. Skull: Normal. Negative for fracture or focal lesion. Sinuses/Orbits: No acute abnormality. Chronic RIGHT mastoid effusion  is again identified. Other: None CT CERVICAL SPINE FINDINGS Alignment: Normal. Skull base and vertebrae: No acute fracture. No primary bone lesion or focal pathologic process. Soft tissues and spinal canal: No prevertebral fluid or swelling. No visible canal hematoma. Disc levels: Unchanged mild to moderate degenerative disc disease/spondylosis and mild facet arthropathy within the cervical spine. These findings contribute to the following: Mild central spinal narrowing at C3-4 Mild central spinal narrowing at C4-5 Moderate central spinal and biforaminal narrowing at C5-6. Upper chest: Negative. Other: None IMPRESSION: 1. No evidence of acute intracranial abnormality. Atrophy and chronic small-vessel white matter ischemic changes. 2. No static evidence of acute injury to the cervical spine. 3. Degenerative changes as described within significant change since 03/25/2018. Moderate central spinal and biforaminal narrowing at C5-6. 4. Chronic RIGHT mastoid effusion Electronically Signed   By: Margarette Canada M.D.   On: 12/29/2019 10:17   CT CERVICAL SPINE WO CONTRAST  Result Date: 12/29/2019 CLINICAL DATA:  80 year old female with altered mental status, generalized weakness and neck pain. EXAM: CT HEAD WITHOUT CONTRAST CT CERVICAL SPINE WITHOUT CONTRAST TECHNIQUE: Multidetector CT imaging of the head and cervical spine was performed following the standard protocol without intravenous contrast. Multiplanar CT image reconstructions of the cervical spine were also generated. COMPARISON:  03/22/2018 brain MR, 01/29/2014 head CT, 03/25/2018 neck CT and other studies. FINDINGS: CT HEAD FINDINGS Brain: No evidence of acute infarction, hemorrhage, hydrocephalus, extra-axial collection or mass lesion/mass effect. Atrophy and chronic small-vessel white matter ischemic changes again noted. Vascular: Carotid and vertebral atherosclerotic calcifications are noted. Skull: Normal. Negative for fracture or focal lesion. Sinuses/Orbits:  No acute abnormality. Chronic RIGHT mastoid effusion is again identified. Other: None CT CERVICAL SPINE FINDINGS Alignment: Normal. Skull base and vertebrae: No acute fracture. No primary bone lesion or focal pathologic process. Soft tissues and spinal canal: No prevertebral fluid or swelling. No visible canal hematoma. Disc levels: Unchanged mild to moderate degenerative disc disease/spondylosis and mild facet arthropathy within the cervical spine. These findings contribute to the following: Mild central spinal narrowing at C3-4 Mild central spinal narrowing at C4-5 Moderate central spinal and biforaminal narrowing at C5-6. Upper chest: Negative. Other: None IMPRESSION: 1. No evidence of acute intracranial abnormality. Atrophy and chronic small-vessel white matter ischemic changes. 2. No static evidence of acute injury to the cervical spine. 3. Degenerative changes as described within significant change since 03/25/2018. Moderate central spinal and biforaminal narrowing at C5-6. 4. Chronic RIGHT mastoid effusion Electronically Signed   By: Margarette Canada M.D.   On: 12/29/2019 10:17   CT ABDOMEN PELVIS W CONTRAST  Result Date: 12/28/2019 CLINICAL DATA:  Abdominal distension, nausea and vomiting the past 10 days. Increased weakness and confusion. EXAM: CT ABDOMEN AND PELVIS WITH CONTRAST TECHNIQUE: Multidetector CT imaging of the abdomen and pelvis was performed using the standard protocol following bolus administration of intravenous contrast. CONTRAST:  139m OMNIPAQUE IOHEXOL 300 MG/ML  SOLN COMPARISON:  May 23, 2018. January 04, 2018. January 28, 2014.  FINDINGS: Lower chest: Trace pleural fluid and mild bronchitis in the left lower lobe. Mild cardiomegaly. Four-vessel coronary calcification. Partial imaging of a right central vascular catheter port, its tip in the right cardiac atrium. Thoracic aortic calcified atherosclerosis. Hepatobiliary: A partially thrombosed main portal vein extending from its confluence  with the mesenteric vein into the proximal portion of the right portal vein occupying approximately 50% of the vessel lumen. No thrombus in these regions on the August 2019 study. Patent right anterior and left portal veins. Cirrhosis with small amount of ascites. A 4 cm heterogeneous parenchyma in the right hepatic dome, incompletely characterized, and possibly secondary to the venous thrombosis or an underlying malignancy. A 5.2 cm gallbladder dilatation with dense sludge or tiny stones layering at the gallbladder neck, which could partially obstruct the cystic duct. No intrahepatic biliary duct dilatation. An 8 mm dilatation of the proximal common bile duct with normal distal tapering. Pancreas: Diffuse moderate atrophy and fatty infiltration of the pancreas. No acute pancreatitis. Spleen: Splenomegaly measuring 15 by 6 by 14 cm, similar. A 9 mm and 10 mm hypoattenuating lesion in the splenic parenchyma, similarly sized compared to March 2019. Although these are incompletely characterized, their stability suggests a benign hemorrhagic or cystic etiology and no further imaging characterization is indicated according to the ACR criteria. Adrenals/Urinary Tract: Punctate nonobstructing right renal calculus. Stomach/Bowel: Apparent mild diffuse colonic wall thickening that could be due in part to the adjacent ascites. Mild diverticulosis in the distal descending and sigmoid colon. No bowel obstruction. Surgically absent or decompressed appendix. Vascular/Lymphatic: Possible small focal dependent thrombus versus CT telemetry lead artifact in the inferior cava intrahepatic portion with less than 25% luminal narrowing. Patent and ectatic splenic vein. Abdominal aorta calcified atherosclerosis without aneurysmal dilatation. Circumferential calcified atherosclerosis resulting in 75% luminal narrowing of the celiac artery and 50% narrowing of the superior mesenteric artery. An abnormally enlarged 34 by 15 mm periportal  lymph node. Clustered interaortocaval and para-aortic nodes measuring up to 12 by 26 mm. Reproductive: Age-appropriate atrophy of the uterine myometrium. A 10 mm endometrial stripe thickness in the sagittal plane, abnormally thickened for an 79 year old female. Please note the patient medical record states a history of hysterectomy. Probable bilateral oophorectomy. No adnexal cyst or mass. Other: A 25 mm simple cyst in the right lateral hip subdermal subcutaneous fat plane. No associated inflammatory change or wall thickening. Musculoskeletal: Diffuse bone demineralization and moderate to advanced skeletal degenerative changes most pronounced at the lumbosacral facets. A 13 mm L4 vertebral body hemangioma present since April 2015. I discussed the critical value/emergent results and further recommended imaging by telephone at the time of interpretation on 12/28/2019 at 7:55 pm with provider Dr. Francia Greaves , who verbally acknowledged these results. 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 Small focal nonocclusive thrombus in the intrahepatic inferior vena cava. These may be secondary to the patient's cirrhosis, however, correlation with lab values including an alpha fetoprotein marker may be useful. Partial characterization of heterogeneous parenchyma in the right hepatic dome concerning for a possible hepatocellular carcinoma. Dedicated MR or CT liver protocol imaging without and with intravenous contrast was recommended. Probable recent viral infection with mild bronchitis and trace pleural effusion in the left lower lobe, a mild uncomplicated pancolitis, and gallbladder hydrops, a 5.2 cm  gallbladder dilatation with dense sludge or tiny stones layering at the gallbladder  neck, which could partially obstruct the cystic duct. If there is concern for acute cholecystitis or cystic duct obstruction, hepatobiliary scintigraphy could be considered. Abnormally thickened endometrial stripe for an 79 year old female. Recommend further gynecological consultation and nonemergent pelvis ultrasonography. A nonspecific abnormally enlarged periportal lymph node and abnormally enlarged retroperitoneal lymph node with clustered smaller nodes, potentially reactive or metastatic. Punctate nonobstructing right renal calculus. Four-vessel coronary calcification. A 25 mm subdermal cyst in the right lateral hip, possibly a sebaceous cyst. No associated inflammatory change. Thoracoabdominal aortic calcified atherosclerosis.  (ICD10-I70.0). Electronically Signed   By: Revonda Humphrey   On: 12/28/2019 20:21   DG Chest Port 1 View  Result Date: 12/28/2019 CLINICAL DATA:  Weakness. EXAM: PORTABLE CHEST 1 VIEW COMPARISON:  August 10, 2019. FINDINGS: Stable cardiomegaly. No pneumothorax or pleural effusion is noted. Right lung is clear. Mild left lingular opacity is noted concerning for atelectasis or possibly infiltrate. Interval placement of right internal jugular Port-A-Cath with distal tip in expected position of cavoatrial junction. Bony thorax is unremarkable. IMPRESSION: Mild left lingular opacity is noted concerning for atelectasis or possibly infiltrate. Electronically Signed   By: Marijo Conception M.D.   On: 12/28/2019 16:11     Labs:   Basic Metabolic Panel: Recent Labs  Lab 12/29/19 0425 12/29/19 0425 12/30/19 0159 12/30/19 0159 12/30/19 2329 12/30/19 2329 12/31/19 1330 01/01/20 0415 01/02/20 0452  NA 137  --  138  --  137  --  137  --  137  K 2.5*   < > 3.4*   < > 4.0   < > 3.6  --  3.7  CL 103  --  104  --  106  --  105  --  110  CO2 24  --  25  --  23  --  22  --  22  GLUCOSE 99  --  107*  --  129*  --  164*  --  115*  BUN 9  --  9  --  8  --  8  --  12  CREATININE 1.11*   --  1.06*  --  1.17*  --  1.01*  --  1.10*  CALCIUM 7.9*  --  7.9*  --  8.0*  --  8.1*  --  8.1*  MG  --   --  1.7  --  1.7  --   --  1.7 1.8  PHOS  --   --  2.2*  --  2.7  --   --  2.4*  --    < > = values in this interval not displayed.   GFR Estimated Creatinine Clearance: 46.5 mL/min (A) (by C-G formula based on SCr of 1.1 mg/dL (H)). Liver Function Tests: Recent Labs  Lab 12/29/19 0425 12/30/19 0159 12/30/19 2329 12/31/19 1330 01/02/20 0452  AST _0 ALT _1 ALKPHOS 84 84 78 89 77  BILITOT 1.7* 1.8* 1.4* 1.2 0.9  PROT 5.0* 5.0* 5.3* 5.2* 4.8*  ALBUMIN 2.6* 2.6* 2.6* 2.7* 2.6*   Recent Labs  Lab 12/28/19 1534  LIPASE 23   Recent Labs  Lab 12/28/19 1535 12/31/19 1515  AMMONIA 56* 42*   Coagulation profile Recent Labs  Lab 12/30/19 0159  INR 1.6*    CBC: Recent Labs  Lab 12/28/19 1534 12/28/19 1534 12/29/19 0425 12/30/19 0159 12/30/19 2329 12/31/19 1330 01/01/20 0415  WBC 44.2*   < > 27.2* 27.0* 24.4* 26.7* 25.6*  NEUTROABS 35.4*  --  22.2*  --   --  22.1*  --   HGB 13.7   < > 10.5* 9.9* 10.3* 10.3* 10.0*  HCT 42.6   < > 33.1* 31.6* 32.2* 32.6* 32.1*  MCV 92.4   < > 92.5 93.2 93.9 93.7 93.6  PLT 266   < > 161 152 142* 149* 154   < > = values in this interval not displayed.   Cardiac Enzymes: No results for input(s): CKTOTAL, CKMB, CKMBINDEX, TROPONINI in the last 168 hours. BNP: Invalid input(s): POCBNP CBG: Recent Labs  Lab 01/01/20 1137 01/01/20 1610 01/01/20 2105 01/02/20 0827 01/02/20 1156  GLUCAP 167* 164* 112* 125* 145*   D-Dimer No results for input(s): DDIMER in the last 72 hours. Hgb A1c No results for input(s): HGBA1C in the last 72 hours. Lipid Profile No results for input(s): CHOL, HDL, LDLCALC, TRIG, CHOLHDL, LDLDIRECT in the last 72 hours. Thyroid function studies No results for input(s): TSH, T4TOTAL, T3FREE, THYROIDAB in the last 72 hours.  Invalid input(s): FREET3 Anemia work up No results for  input(s): VITAMINB12, FOLATE, FERRITIN, TIBC, IRON, RETICCTPCT in the last 72 hours. Microbiology Recent Results (from the past 240 hour(s))  Blood Culture (routine x 2)     Status: None   Collection Time: 12/28/19  4:13 PM   Specimen: BLOOD RIGHT ARM  Result Value Ref Range Status   Specimen Description   Final    BLOOD RIGHT ARM Performed at [facility], [place] [address] [place], [place] [address]    Special Requests   Final    BOTTLES DRAWN AEROBIC AND ANAEROBIC Blood Culture adequate volume Performed at [place] [address] [place], [place] [address]    Culture   Final    NO GROWTH 5 DAYS Performed at [facility], [place] [address] [place], [place] [address]    Report Status 01/02/2020 FINAL  Final  Respiratory Panel by RT PCR (Flu A&B, Covid) - Nasopharyngeal Swab     Status: None   Collection Time: 12/28/19  4:15 PM   Specimen: Nasopharyngeal Swab  Result Value Ref Range Status   SARS Coronavirus 2 by RT PCR NEGATIVE NEGATIVE Final    Comment: (NOTE) SARS-CoV-2 target nucleic acids are NOT DETECTED. The SARS-CoV-2 RNA is generally detectable in upper respiratoy specimens during the acute phase of infection. The lowest concentration of SARS-CoV-2 viral copies this assay can detect is 131 copies/mL. A negative result does not preclude SARS-Cov-2 infection and should not be used as the sole basis for treatment or other patient management decisions. A negative result may occur with  improper specimen collection/handling, submission of specimen other than nasopharyngeal swab, presence of viral mutation(s) within the areas targeted by this assay, and inadequate number of viral copies (<131 copies/mL). A negative result must be combined with clinical observations, patient history, and epidemiological information. The expected result is Negative. Fact Sheet for Patients:  PinkCheek.be Fact Sheet  for Healthcare Providers:  GravelBags.it This test is not yet ap proved or cleared by the [place] FDA and  has been authorized for detection and/or diagnosis of SARS-CoV-2 by FDA under an Emergency Use Authorization (EUA). This EUA will remain  in effect (meaning this test can be used) for the duration of the COVID-19 declaration under Section 564(b)(1) of the Act, 21 U.S.C. section 360bbb-3(b)(1), unless the authorization is terminated or revoked sooner.  Influenza A by PCR NEGATIVE NEGATIVE Final   Influenza B by PCR NEGATIVE NEGATIVE Final    Comment: (NOTE) The Xpert Xpress SARS-CoV-2/FLU/RSV assay is intended as an aid in  the diagnosis of influenza from Nasopharyngeal swab specimens and  should not be used as a sole basis for treatment. Nasal washings and  aspirates are unacceptable for Xpert Xpress SARS-CoV-2/FLU/RSV  testing. Fact Sheet for Patients: PinkCheek.be Fact Sheet for Healthcare Providers: GravelBags.it This test is not yet approved or cleared by the [place] FDA and  has been authorized for detection and/or diagnosis of SARS-CoV-2 by  FDA under an Emergency Use Authorization (EUA). This EUA will remain  in effect (meaning this test can be used) for the duration of the  Covid-19 declaration under Section 564(b)(1) of the Act, 21  U.S.C. section 360bbb-3(b)(1), unless the authorization is  terminated or revoked. Performed at [facility], [place] [address], [place], [place] [address]   Blood Culture (routine x 2)     Status: None (Preliminary result)   Collection Time: 12/29/19  7:47 AM   Specimen: BLOOD LEFT HAND  Result Value Ref Range Status   Specimen Description   Final    BLOOD LEFT HAND Performed at [facility], [place] [address], [place], [place] [address]    Special Requests   Final    BOTTLES DRAWN AEROBIC AND ANAEROBIC Blood Culture  results may not be optimal due to an inadequate volume of blood received in culture bottles Performed at [place] [address] [place], [place] [address]    Culture   Final    NO GROWTH 4 DAYS Performed at [facility], [place] [address] [place], [place] [address]    Report Status PENDING  Incomplete  Culture, body fluid-bottle     Status: None (Preliminary result)   Collection Time: 12/31/19  3:30 PM   Specimen: Fluid  Result Value Ref Range Status   Specimen Description FLUID PERITONEAL  Final   Special Requests BOTTLES DRAWN AEROBIC AND ANAEROBIC 10CC  Final   Culture   Final    NO GROWTH 2 DAYS Performed at [facility], [place] [address], [place], [place] [address]    Report Status PENDING  Incomplete  Gram stain     Status: None   Collection Time: 12/31/19  3:30 PM   Specimen: Fluid  Result Value Ref Range Status   Specimen Description FLUID PERITONEAL  Final   Special Requests NONE  Final   Gram Stain   Final    MODERATE WBC PRESENT,BOTH PMN AND MONONUCLEAR NO ORGANISMS SEEN Performed at [facility], [address], [place], [place] [address]    Report Status 12/31/2019 FINAL  Final     Discharge Instructions:   Discharge Instructions    Diet - low sodium heart healthy   Complete by: As directed    Discharge instructions   Complete by: As directed    Please take medications as prescribed.  You have been prescribed  new medication.  Continue physical therapy as outpatient.  Your primary care physician in 1 week, you will need to have a refill for your blood thinner.  Follow-up with oncology in 2 to 3 weeks.  Plavix has been discontinued and you have been started on blood thinner.  Please take precautions.  If you notice any bleeding, please seek medical attention.  Take lactulose to move your bowels 2 times a day (hold for  BM >2/day).  Follow-up with your primary GI doctor Dr. Edison Nasuti in 2 to 3 weeks.   Increase activity slowly    Complete by: As directed      Allergies as of 01/02/2020      Reactions   Penicillins Swelling   Has patient had a PCN reaction causing immediate rash, facial/tongue/throat swelling, SOB or lightheadedness with hypotension: No Has patient had a PCN reaction causing severe rash involving mucus membranes or skin necrosis: No Has patient had a PCN reaction that required hospitalization: No Has patient had a PCN reaction occurring within the last 10 years: No If all of the above answers are "NO", then may proceed with Cephalosporin use.   Lisinopril Cough   Tape Hives   Doxycycline Hives, Swelling, Rash   Latex Hives, Itching, Rash      Medication List    STOP taking these medications   clopidogrel 75 MG tablet Commonly known as: PLAVIX   predniSONE 50 MG tablet Commonly known as: DELTASONE     TAKE these medications   Accu-Chek Guide w/Device Kit 1 kit by Does not apply route as directed.   acetaminophen 325 MG tablet Commonly known as: TYLENOL Take 2 tablets (650 mg total) by mouth every 6 (six) hours as needed for mild pain (or Fever >/= 101).   amLODipine 5 MG tablet Commonly known as: NORVASC Take 1 tablet (5 mg total) by mouth daily.   Apixaban Starter Pack 5 MG Tbpk Commonly known as: ELIQUIS STARTER PACK Take as directed on package: start with two-57m tablets twice daily for 7 days. On day 8, switch to one-53mtablet twice daily.   BD Pen Needle Nano U/F 32G X 4 MM Misc Generic drug: Insulin Pen Needle Use to inject insulin 2 times a day What changed:   how much to take  when to take this  additional instructions   diazepam 5 MG tablet Commonly known as: VALIUM Valium 5 mg by mouth at bedtime as needed. What changed:   how much to take  how to take this  when to take this  reasons to take this  additional instructions   ezetimibe 10 MG tablet Commonly known as: ZETIA TAKE 1 TABLET(10 MG) BY MOUTH DAILY What changed: See the new  instructions.   fluconazole 150 MG tablet Commonly known as: DIFLUCAN Take one tab weekly for two weeks What changed:   how much to take  how to take this  when to take this  additional instructions   furosemide 40 MG tablet Commonly known as: LASIX TAKE 1 TABLET(40 MG) BY MOUTH DAILY What changed: See the new instructions.   glucose blood test strip Commonly known as: Accu-Chek Guide Use to check blood sugars 3 times daily   OneTouch Verio test strip Generic drug: glucose blood USE TO TEST BLOOD SUGAR THREE TIMES DAILY   HumuLIN R U-500 KwikPen 500 UNIT/ML kwikpen Generic drug: insulin regular human CONCENTRATED Inject 90 Units into the skin daily.   Jakafi 25 MG tablet Generic drug: ruxolitinib phosphate TAKE 1 TABLET (25 MG TOTAL) BY MOUTH 2 TIMES DAILY. What changed: See the new instructions.   lactulose 10 GM/15ML solution Commonly known as: CHRONULAC Take 30 mLs (20 g total) by mouth daily. Start taking on: January 03, 2020   latanoprost 0.005 % ophthalmic solution Commonly known as: XALATAN Place 1 drop into both eyes at bedtime.   lidocaine-prilocaine cream Commonly known as: EMLA Apply 1 application topically as needed. Please apply EMLA  over the Port-A-Cath site 1 hour prior to coming to our office.   mirtazapine 15 MG tablet Commonly known as: REMERON Take 1 tablet (15 mg total) by mouth at bedtime. What changed: how much to take   oxyCODONE-acetaminophen 5-325 MG tablet Commonly known as: PERCOCET/ROXICET Take 1-2 tablets by mouth every 8 (eight) hours as needed for severe pain.   pantoprazole 40 MG tablet Commonly known as: PROTONIX Take 1 tablet (40 mg total) by mouth daily. Start taking on: January 03, 2020   potassium chloride 10 MEQ tablet Commonly known as: Klor-Con M10 Take 1 tablet (10 mEq total) by mouth 2 (two) times daily. What changed: when to take this   promethazine 25 MG tablet Commonly known as: PHENERGAN Take 0.5 tablets  (12.5 mg total) by mouth every 6 (six) hours as needed for nausea or vomiting.   timolol 0.5 % ophthalmic solution Commonly known as: TIMOPTIC 1 drop daily.   triamcinolone cream 0.1 % Commonly known as: KENALOG Apply 1 application topically 2 (two) times daily.   TRULICITY [place] Inject 1 pen into the skin once a week.      Follow-up Information    Amanda [name]. Call in 1 week(s).   Specialty: Family Medicine Why: For regular follow-up Contact information: [place] [address] [phone]        Milus Banister, MD. Schedule an appointment as soon as possible for a visit.   Specialty: Gastroenterology Why: 2 to 3 weeks for cirrhosis/GI follow-up. Contact information: [address] [place] [address] [phone]        Volanda Napoleon, MD Follow up.   Specialty: Oncology Why: as scheduled by the clinic Contact information: [place] [address] [phone]            Time coordinating discharge: 39 minutes  Signed:  Zamyah Wiesman  [facility] 01/02/2020, 2:42 PM

## 2020-01-02 NOTE — Telephone Encounter (Signed)
Spoke with pts husband who called in yesterday. Notified of Dr.Kelly's recommendations to stop the plavix since she would be taking the eliquis. Husband states that she is in the hospital now, and she will be on the eliquis for 2 months. He wants to know if she should restart the Plavix after those 2 months. Will route to Dr.Kelly for review.

## 2020-01-02 NOTE — Progress Notes (Signed)
Progress Note   Subjective  Day #5 Chief Complaint: Altered mental status, lethargy, nausea and vomiting  Patient continues on IV Flagyl and IV Cefepime  Today, patient is laying over in bed and seems very down.  She keeps asking me when she can go home.  Denies any abdominal pain at all but continues to feel just "weak".  Tells me her last bowel movement was yesterday evening and it was liquid.  None since.  Denies nausea or vomiting.   Objective   Vital signs in last 24 hours: Temp:  [97.9 F (36.6 C)-98.1 F (36.7 C)] 97.9 F (36.6 C) (03/17 0523) Pulse Rate:  [72-87] 83 (03/17 0523) Resp:  [16-26] 16 (03/17 0523) BP: (105-126)/(44-77) 126/55 (03/17 0523) SpO2:  [96 %-98 %] 96 % (03/17 0523) Last BM Date: 12/31/19 General:    Elderly white female in NAD Heart:  Regular rate and rhythm; no murmurs Lungs: Respirations even and unlabored, lungs CTA bilaterally Abdomen:  Soft, obese, nontender and nondistended. Normal bowel sounds. Extremities:  Without edema. Neurologic:  Alert and oriented,  grossly normal neurologically. Psych:  Cooperative. Normal mood and affect.  Intake/Output from previous day: 03/16 0701 - 03/17 0700 In: 660 [P.O.:240; IV Piggyback:420] Out: -   Lab Results: Recent Labs    12/30/19 2329 12/31/19 1330 01/01/20 0415  WBC 24.4* 26.7* 25.6*  HGB 10.3* 10.3* 10.0*  HCT 32.2* 32.6* 32.1*  PLT 142* 149* 154   BMET Recent Labs    12/30/19 2329 12/31/19 1330 01/02/20 0452  NA 137 137 137  K 4.0 3.6 3.7  CL 106 105 110  CO2 23 22 22   GLUCOSE 129* 164* 115*  BUN 8 8 12   CREATININE 1.17* 1.01* 1.10*  CALCIUM 8.0* 8.1* 8.1*   LFT Recent Labs    01/02/20 0452  PROT 4.8*  ALBUMIN 2.6*  AST 19  ALT 7  ALKPHOS 77  BILITOT 0.9   Studies/Results: DG Chest 2 View  Result Date: 12/31/2019 CLINICAL DATA:  Post paracentesis. Pneumonia. Altered mental status EXAM: CHEST - 2 VIEW COMPARISON:  12/28/2019 FINDINGS: Cardiac enlargement  without heart failure or edema. No significant pleural effusion. Atherosclerotic aortic arch Port-A-Cath tip in the SVC. Negative for pneumonia.  Mild bibasilar atelectasis. IMPRESSION: Mild bibasilar atelectasis.  Negative for pneumonia. Electronically Signed   By: Franchot Gallo M.D.   On: 12/31/2019 16:03   US Paracentesis  Result Date: 12/31/2019 INDICATION: Patient with history NASH cirrhosis, colitis, chronic kidney disease, coronary artery disease, ascites. Request made for diagnostic and therapeutic paracentesis. EXAM: ULTRASOUND GUIDED DIAGNOSTIC AND THERAPEUTIC PARACENTESIS MEDICATIONS: None COMPLICATIONS: None immediate. PROCEDURE: Informed written consent was obtained from the patient after a discussion of the risks, benefits and alternatives to treatment. A timeout was performed prior to the initiation of the procedure. Initial ultrasound scanning demonstrates a small to moderate amount of ascites within the left mid to lower abdominal quadrant. The left mid to lower abdomen was prepped and draped in the usual sterile fashion. 1% lidocaine was used for local anesthesia. Following this, a 19 gauge, 10-cm, Yueh catheter was introduced. An ultrasound image was saved for documentation purposes. The paracentesis was performed. The catheter was removed and a dressing was applied. The patient tolerated the procedure well without immediate post procedural complication. FINDINGS: A total of approximately 1.5 liters of yellow fluid was removed. Samples were sent to the laboratory as requested by the clinical team. IMPRESSION: Successful ultrasound-guided diagnostic and therapeutic paracentesis yielding 1.5 liters of peritoneal  fluid. Read by: Rowe [name], PA-C Electronically Signed   By: Jacqulynn Cadet M.D.   On: 12/31/2019 15:30    Assessment / Plan:   Assessment: 1.  Decompensated cirrhosis: Felt secondary to Simms, admitted to the hospital after being at home with 1/2 weeks of lethargy and mild  confusion and acute nausea and vomiting for 2 days prior to admit 2.  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 difficile pending 3.  Hepatic encephalopathy: Associated with acute illness now resolved on low-dose lactulose 4.  Partial portal vein thrombosis: 5.  Nausea and lack of appetite: Patient does not complain of nausea today but does continue with a decrease in appetite 6.  CAD: Status post stents x2 approximately 5 years ago, typically maintained on Plavix 7.  Debilitation  Plan: 1.  Continue low-dose Lactulose 2.  Per Dr. Vena Rua recommendations would consider stopping antibiotics as it has been 5 days and observe 3.  Again we have started anticoagulation with plans to transition to Xarelto for partial portal vein thrombosis.  With patient cirrhosis she is at higher risk for bleeding and should be observed closely for any evidence of GI bleed.  Again would recommend reimaging of the portal vein in 3 months and if clot is stable would consider stopping anticoagulation 4.  Per cardiology's notes yesterday it is okay to DC Plavix if the patient is on Eliquis 5.  Continue supportive care 6.  Please await any further recommendations from Dr. Hilarie Fredrickson later today  Thank you for your kind consultation.   LOS: 5 days   Levin Erp  01/02/2020, 9:33 AM

## 2020-01-03 ENCOUNTER — Telehealth: Payer: Self-pay | Admitting: *Deleted

## 2020-01-03 ENCOUNTER — Telehealth: Payer: Self-pay | Admitting: Gastroenterology

## 2020-01-03 LAB — CULTURE, BLOOD (ROUTINE X 2): Culture: NO GROWTH

## 2020-01-03 NOTE — Telephone Encounter (Signed)
Dr [name] please advise if the pt should continue lactulose.  She states she was given it during hospital stay.

## 2020-01-03 NOTE — Telephone Encounter (Signed)
Transition Care Management Follow-up Telephone Call   Date discharged? 01/02/2020   How have you been since you were released from the hospital? "She is still weak with her legs" per husband    Do you understand why you were in the hospital? yes   Do you understand the discharge instructions? yes   Where were you discharged to?  Home    Items Reviewed:  Medications reviewed: yes  Allergies reviewed: yes  Dietary changes reviewed: yes  Referrals reviewed: N/A    Functional Questionnaire:   Activities of Daily Living (ADLs):   She states they are independent in the following: feeding, continence and toileting States they require assistance with the following: ambulation, grooming, toileting and dressing   Any transportation issues/concerns?: no   Any patient concerns? Yes, medication duration concerns and patient still has no appetite     Confirmed importance and date/time of follow-up visits scheduled yes  Provider Appointment booked with [name] 01/10/2020 at 1:30PM   Confirmed with patient if condition begins to worsen call PCP or go to the ER.  Patient was given the office number and encouraged to call back with question or concerns.  : yes

## 2020-01-04 ENCOUNTER — Encounter: Payer: Self-pay | Admitting: Family Medicine

## 2020-01-04 ENCOUNTER — Telehealth: Payer: Self-pay | Admitting: Adult Health

## 2020-01-04 DIAGNOSIS — I639 Cerebral infarction, unspecified: Secondary | ICD-10-CM

## 2020-01-04 DIAGNOSIS — R2681 Unsteadiness on feet: Secondary | ICD-10-CM

## 2020-01-04 NOTE — Telephone Encounter (Signed)
Called to inform the pt that his letter is ready for pick up.  Pt explained that her appointment date is no longer 01/07/2020 but has been moved to 01/24/2020.  She will not be going to regular PT.  She will be going to neuro PT.  I will place a new referral.

## 2020-01-04 NOTE — Telephone Encounter (Signed)
Ok to perform PT

## 2020-01-04 NOTE — Telephone Encounter (Signed)
The pt's husband has been advised of the recommendations to continue lactulose to maintain 2-3 soft stools daily.  The pt has been advised of the information and verbalized understanding.

## 2020-01-04 NOTE — Telephone Encounter (Signed)
Pt husband is requesting a medical release from [facility] that it is ok for her to be seen by Rehab. Her appt is Monday 3/22 and they will not see her without it due to her being in the hospital. Pt husband would like a call back when it is complete. He said it is urgent this is done today.

## 2020-01-04 NOTE — Telephone Encounter (Signed)
Yes, she should continue taking the lactulose.  The goal is for her to have 2-3 soft stools daily with the lactulose.  She is free to adjust her dose up/down to achieve that goal.

## 2020-01-05 LAB — CULTURE, BODY FLUID W GRAM STAIN -BOTTLE: Culture: NO GROWTH

## 2020-01-07 ENCOUNTER — Ambulatory Visit: Payer: Medicare HMO | Admitting: Physical Therapy

## 2020-01-07 ENCOUNTER — Telehealth: Payer: Self-pay | Admitting: Adult Health

## 2020-01-07 NOTE — Telephone Encounter (Signed)
I called the patient back and asked him if he happened to contact the hospital to see if they can send someone out today.  The patient is going to contact the hospital to see if they can send someone out today to help him.

## 2020-01-07 NOTE — Telephone Encounter (Signed)
Patient came home Wednesday from the hospital. He was trying to get her changed and she fell out of bed and she can't get in and out of bed to go to the bathroom. She needs depends.   He thought that he would be able to take care of her from the hospital but he can't. He needs Home Health Care to come to the house today to help with the patient.  Please advise

## 2020-01-08 ENCOUNTER — Telehealth: Payer: Self-pay | Admitting: Adult Health

## 2020-01-08 ENCOUNTER — Telehealth: Payer: Self-pay | Admitting: Gastroenterology

## 2020-01-08 NOTE — Telephone Encounter (Signed)
Discussed with pts husband that she does need to continue the lactulose, she needs to be having 2-3 soft stools daily. He wanted to know how long she needed to take the medication. Again he was told indefinitely. Explained if she did not take it she could start becoming confused and need to be hospitalized-hepatic encephalopathy.

## 2020-01-08 NOTE — Telephone Encounter (Signed)
Tried to reach [facility] [phone] to speak to [facility] but received no answer or machine.

## 2020-01-08 NOTE — Telephone Encounter (Signed)
Error

## 2020-01-08 NOTE — Telephone Encounter (Signed)
Spoke to [employer].  Asked him if he would like a skilled nursing service.  He stated "no, I want someone to come out and help bathe her and change her depends."  Amanda [name] that he needs a home health aid.  Informed him there are many companies here is [place] that provide that service.  He will confirm any coverage that he has with his insurance and will then start calling around.  An order is not needed from [facility] for this service.  Nothing further needed at this time.

## 2020-01-08 NOTE — Telephone Encounter (Signed)
Spoke to Amanda [name],   He does not feel as though Amanda [name] needs Hospice and Palliative Care. He would like some help with her in the home.

## 2020-01-08 NOTE — Telephone Encounter (Signed)
She would qualify for skilled nursing.   If they want [facility] then insurance would likely cover for a short period of time for rehab and things along that nature. Would need to do a video call first

## 2020-01-08 NOTE — Telephone Encounter (Signed)
Husband called and is using [facility].  Left a message for nurse to fax over order form so pt can be referred.

## 2020-01-08 NOTE — Telephone Encounter (Signed)
Amanda [name] stated he spoke to [facility] his insurance and they told him if Amanda [name] sends in a referral for [facility] nurse aid with in house physical therapy they will accept it so she is covered for both.   Pt can be reached at [phone]

## 2020-01-08 NOTE — Telephone Encounter (Signed)
No information in encounter.

## 2020-01-08 NOTE — Telephone Encounter (Signed)
Would pt qualify for skilled nursing?

## 2020-01-08 NOTE — Telephone Encounter (Signed)
Spoke to [place] at [facility] and she thinks the pt may be more suited for Hospice.  Service would also be able to start sooner with Hospice.  [facility] own hospice and I will need to contact [place] or Celesta Gentile at [phone].  Will forward to [facility] for authorization.

## 2020-01-08 NOTE — Telephone Encounter (Signed)
Pt husband is requesting a call from [facility] in regards to getting his wife home health care.

## 2020-01-08 NOTE — Telephone Encounter (Signed)
Pt Husband call stated he need someone from home health to come in and help with his wife .he stated  [facility] and they are waiting to here from the dr.he need someone now.

## 2020-01-08 NOTE — Telephone Encounter (Signed)
Duplicate message.  See message from 01/07/20.

## 2020-01-08 NOTE — Telephone Encounter (Signed)
[name] called to inform that pt has been having soft stools since yesterday. He wants to know if she needs to continue taking lactulose.

## 2020-01-09 NOTE — Telephone Encounter (Signed)
Spoke to [place] at [facility].  She has no PT available for several weeks.  They only employ one nurse aid and it will be quite awhile before she is available to come see the pt.  [name] mentioned someone to help him with bathing, changing her depends etc...Marland KitchenMarland Kitchenthe nurse aid will only assist with bathing and grooming and then she will leave.  [name] will be caring for his wife the whole day as he is now.  [name] has expressed that he is unable to care for her.  Burman Nieves has again advised Hospice or to have [name] take the pt back to the hospital for a "qualifying stay."  This way they can send her to an appropriate rehab.

## 2020-01-09 NOTE — Telephone Encounter (Signed)
Left a message for a return call.

## 2020-01-09 NOTE — Telephone Encounter (Signed)
It will be the same for any other home health agency. They will assist with bathing and grooming but they will not be there all day to help care for her. It sounds like what they are wanting is private duty which is an out of pocket expense. I have heard that [facility] does good work-   [phone]  They other options is to take her back to the hospital and have her placed at a facility from there.

## 2020-01-10 ENCOUNTER — Encounter: Payer: Self-pay | Admitting: Family Medicine

## 2020-01-10 ENCOUNTER — Telehealth: Payer: Self-pay | Admitting: Adult Health

## 2020-01-10 ENCOUNTER — Other Ambulatory Visit: Payer: Self-pay

## 2020-01-10 ENCOUNTER — Telehealth: Payer: Self-pay | Admitting: Gastroenterology

## 2020-01-10 ENCOUNTER — Encounter: Payer: Self-pay | Admitting: Adult Health

## 2020-01-10 ENCOUNTER — Ambulatory Visit (INDEPENDENT_AMBULATORY_CARE_PROVIDER_SITE_OTHER): Payer: Medicare HMO | Admitting: Adult Health

## 2020-01-10 DIAGNOSIS — I81 Portal vein thrombosis: Secondary | ICD-10-CM

## 2020-01-10 DIAGNOSIS — I10 Essential (primary) hypertension: Secondary | ICD-10-CM | POA: Diagnosis not present

## 2020-01-10 DIAGNOSIS — R627 Adult failure to thrive: Secondary | ICD-10-CM

## 2020-01-10 DIAGNOSIS — D45 Polycythemia vera: Secondary | ICD-10-CM | POA: Diagnosis not present

## 2020-01-10 DIAGNOSIS — R112 Nausea with vomiting, unspecified: Secondary | ICD-10-CM

## 2020-01-10 DIAGNOSIS — R197 Diarrhea, unspecified: Secondary | ICD-10-CM | POA: Diagnosis not present

## 2020-01-10 MED ORDER — APIXABAN 5 MG PO TABS: 5.0000 mg | ORAL_TABLET | Freq: Two times a day (BID) | ORAL | 3 refills | Status: DC

## 2020-01-10 NOTE — Telephone Encounter (Signed)
Spoke to [place] from [facility] and she will call the pt to get set up for Hospice/palliative care.  Afterward, Matt from Hospice called and needed orders.  Orders faxed so care can be started asap.  Nothing further needed.

## 2020-01-10 NOTE — Telephone Encounter (Signed)
Pts husband states she is not eating, she is drinking liquids and will eat some soup occasionally. He wanted to know again if she need to continue lactulose. Discussed with him to mention her not eating at her scheduled hospital follow-up today. He verbalized understanding.

## 2020-01-10 NOTE — Progress Notes (Signed)
Virtual Visit via Telephone Note  I connected with [name] on 01/10/20 at  1:30 PM EDT by telephone and verified that I am speaking with the correct person using two identifiers.   I discussed the limitations, risks, security and privacy concerns of performing an evaluation and management service by telephone and the availability of in person appointments. I also discussed with the patient that there may be a patient responsible charge related to this service. The patient expressed understanding and agreed to proceed.  Location patient: home Location provider: work or home office Participants present for the call: patient, provider Patient did not have a visit in the prior 7 days to address this/these issue(s).   History of Present Illness: TCM Visit  Admit Date 12/28/2019 Discharge Date 01/02/2020  She presented to the emergency room with increased weakness for 1 month which had progressed to become bedbound over the prior week.  She also complained of increased nausea, vomiting, and diarrhea with upper abdominal pain.  She denied hematemesis.  Her husband stated that over the last week the patient had not taken any of her medications because of the vomiting.  In the ER her Covid test was negative.  Labs showed a creatinine of 1.1 potassium 2.8, white blood cell count 44.2, lactic acid of 3 which improved to 2.2 after hydration.  Her chest x-ray showed possible left-sided infiltrates.  EKG showed sinus tachycardia.  CT abdomen and pelvis done which showed features concerning for portal vein and hepatic vein thrombosis with possibility of hepatocellular carcinoma changes in the liver and also pancolitis.  He was started on empiric antibiotics after cultures were obtained.  She was also started on IV heparin for portal vein thrombosis admitted for further management  Hospital Course  1) pancolitis with diarrhea-it improved upon discharge.  SIRS criteria on presentation.  She received  cefepime and metronidazole.  There was no obvious source of infection and antibiotics were discontinued  2) possible left lingular pneumonia with hypoxic respiratory failure.  She initially required supplemental oxygen but was weaned off upon discharge.  Repeat chest x-ray showed possible bibasilar atelectasis.  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  Status post paracentesis with no evidence of SBP.  Approximately 1.5 L of yellow fluid was removed.  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  She subsequently was changed to Eliquis.  Oncology consulted and the impression was that the portal vein thrombosis was secondary to cirrhosis of the liver not secondary to polycythemia.  She was unable to undergo MRI due to claustrophobia.  CT of the liver with contrast did not show any evidence of liver mass.  4) history of polycythemia vera -leukocytosis improved upon discharge.  She has seen by Dr. [name] her oncologist during hospitalization.  It was okay to restart to [facility] on discharge  5) generalized weakness-could be secondary to volume depletion, dehydration.  CT head and C-spine without acute findings.  PT evaluation commended skilled nursing facility placement but patient and the patient's spouse refused at this time  6) Acute metabolic encephalopathy -this improved upon discharge.  Was thought to be secondary to infection, volume depletion, liver disease.  She was back to baseline.  Ammonia was within normal limits.  Lactulose was resumed upon discharge  7) History of  CAD -status post stent.  Patient was previously on Plavix but once she was started on Eliquis, Plavix was discontinued which was okayed by cardiology  The patient and  her her husband were on the phone call today.  Since returning home she is not decompensated further.  Her husband reports that she has not had a full meal in the last 6 weeks.  She will have soup and soft foods periodically.  She continues to have nausea and vomiting.  She is also bedbound and he is realizing that he cannot care for her the way that she needs to be cared for.  He is having a tough time cleaning her and caring for her.    Observations/Objective: Patient sounds cheerful and well on the phone. I do not appreciate any SOB. Speech and thought processing are grossly intact. Patient reported vitals:  Assessment and Plan: She continues with failure to thrive.  Ideally she needs a skilled nursing facility.  Advised the patient and her husband that currently she needs to go back to the hospital for "qualifying stay".  She refuses to do this until after [place].  Her husband is going to call [facility] and see if she can be admitted there.  Spoke to the patient's husband earlier in the week and was advised that she would likely benefit from a hospice referral, he refused this.  He is okay with me placing a palliative care referral today.  I will go ahead and do that.  He was advised to eat even if his chicken noodle soup.  We will send in a refill of Eliquis for her.  Follow Up Instructions:   I did not refer this patient for an OV in the next 24 hours for this/these issue(s).  I discussed the assessment and treatment plan with the patient. The patient was provided an opportunity to ask questions and all were answered. The patient agreed with the plan and demonstrated an understanding of the instructions.   The patient was advised to call back or seek an in-person evaluation if the symptoms worsen or if the condition fails to improve as anticipated.  I provided 30 minutes of non-face-to-face time during this encounter.   [name], NP

## 2020-01-10 NOTE — Telephone Encounter (Signed)
Pt's spouse, [name] [name]) Caryl Comes, stated that they are trying to get his wife into a nursing facility but his insurance [employer] stated that she would need to be evaluated by a physical therapist due to her being discharged from the hospital a week ago. He is wondering if Tommi Rumps can help with this?    [name] can be reached at [phone]

## 2020-01-10 NOTE — Telephone Encounter (Signed)
Left a message for Burman Nieves informing her that the pt and husband would like to proceed with palliative care.  Asked for a call back to discuss.  Will wait on follow up call.

## 2020-01-10 NOTE — Telephone Encounter (Signed)
Spoke to [facility] and she will call the pt to get set up for Hospice/palliative care.  Afterward, Matt from Hospice called and needed orders.  Orders faxed so care can be started asap.  Nothing further needed.

## 2020-01-11 NOTE — Telephone Encounter (Signed)
Pt's spouse, [name], stated that the CMA will need to call Theresia Majors at [facility] at in order to get her into rehab. Phone: [phone]   He stated she must call today.   [name] can be reached at [phone]

## 2020-01-12 DIAGNOSIS — K5900 Constipation, unspecified: Secondary | ICD-10-CM | POA: Diagnosis not present

## 2020-01-12 DIAGNOSIS — R110 Nausea: Secondary | ICD-10-CM | POA: Diagnosis not present

## 2020-01-12 DIAGNOSIS — R112 Nausea with vomiting, unspecified: Secondary | ICD-10-CM | POA: Diagnosis not present

## 2020-01-12 DIAGNOSIS — R0902 Hypoxemia: Secondary | ICD-10-CM | POA: Diagnosis not present

## 2020-01-12 DIAGNOSIS — R1111 Vomiting without nausea: Secondary | ICD-10-CM | POA: Diagnosis not present

## 2020-01-13 ENCOUNTER — Encounter (HOSPITAL_COMMUNITY): Payer: Self-pay

## 2020-01-13 ENCOUNTER — Observation Stay (HOSPITAL_COMMUNITY): Payer: Medicare HMO

## 2020-01-13 ENCOUNTER — Inpatient Hospital Stay (HOSPITAL_COMMUNITY)
Admission: EM | Admit: 2020-01-13 | Discharge: 2020-01-25 | DRG: 377 | Disposition: A | Discharge status: Skilled Nursing Facility | Payer: Medicare HMO | Attending: Internal Medicine | Admitting: Internal Medicine

## 2020-01-13 ENCOUNTER — Emergency Department (HOSPITAL_COMMUNITY): Payer: Medicare HMO

## 2020-01-13 DIAGNOSIS — J309 Allergic rhinitis, unspecified: Secondary | ICD-10-CM | POA: Diagnosis present

## 2020-01-13 DIAGNOSIS — K5521 Angiodysplasia of colon with hemorrhage: Secondary | ICD-10-CM

## 2020-01-13 DIAGNOSIS — E669 Obesity, unspecified: Secondary | ICD-10-CM | POA: Diagnosis present

## 2020-01-13 DIAGNOSIS — I252 Old myocardial infarction: Secondary | ICD-10-CM

## 2020-01-13 DIAGNOSIS — E440 Moderate protein-calorie malnutrition: Secondary | ICD-10-CM | POA: Diagnosis present

## 2020-01-13 DIAGNOSIS — I272 Pulmonary hypertension, unspecified: Secondary | ICD-10-CM | POA: Diagnosis present

## 2020-01-13 DIAGNOSIS — R188 Other ascites: Secondary | ICD-10-CM | POA: Diagnosis present

## 2020-01-13 DIAGNOSIS — J90 Pleural effusion, not elsewhere classified: Secondary | ICD-10-CM | POA: Diagnosis present

## 2020-01-13 DIAGNOSIS — B9620 Unspecified Escherichia coli [E. coli] as the cause of diseases classified elsewhere: Secondary | ICD-10-CM | POA: Diagnosis present

## 2020-01-13 DIAGNOSIS — Z7901 Long term (current) use of anticoagulants: Secondary | ICD-10-CM

## 2020-01-13 DIAGNOSIS — N1831 Chronic kidney disease, stage 3a: Secondary | ICD-10-CM | POA: Diagnosis present

## 2020-01-13 DIAGNOSIS — E785 Hyperlipidemia, unspecified: Secondary | ICD-10-CM | POA: Diagnosis present

## 2020-01-13 DIAGNOSIS — Z87891 Personal history of nicotine dependence: Secondary | ICD-10-CM

## 2020-01-13 DIAGNOSIS — Z888 Allergy status to other drugs, medicaments and biological substances status: Secondary | ICD-10-CM

## 2020-01-13 DIAGNOSIS — K31819 Angiodysplasia of stomach and duodenum without bleeding: Secondary | ICD-10-CM | POA: Diagnosis not present

## 2020-01-13 DIAGNOSIS — I10 Essential (primary) hypertension: Secondary | ICD-10-CM | POA: Diagnosis not present

## 2020-01-13 DIAGNOSIS — H409 Unspecified glaucoma: Secondary | ICD-10-CM | POA: Diagnosis present

## 2020-01-13 DIAGNOSIS — Z794 Long term (current) use of insulin: Secondary | ICD-10-CM

## 2020-01-13 DIAGNOSIS — I129 Hypertensive chronic kidney disease with stage 1 through stage 4 chronic kidney disease, or unspecified chronic kidney disease: Secondary | ICD-10-CM | POA: Diagnosis present

## 2020-01-13 DIAGNOSIS — J9811 Atelectasis: Secondary | ICD-10-CM | POA: Diagnosis not present

## 2020-01-13 DIAGNOSIS — F329 Major depressive disorder, single episode, unspecified: Secondary | ICD-10-CM | POA: Diagnosis present

## 2020-01-13 DIAGNOSIS — Z91040 Latex allergy status: Secondary | ICD-10-CM

## 2020-01-13 DIAGNOSIS — Z7401 Bed confinement status: Secondary | ICD-10-CM | POA: Diagnosis not present

## 2020-01-13 DIAGNOSIS — K2970 Gastritis, unspecified, without bleeding: Secondary | ICD-10-CM | POA: Diagnosis present

## 2020-01-13 DIAGNOSIS — Z7189 Other specified counseling: Secondary | ICD-10-CM

## 2020-01-13 DIAGNOSIS — Z66 Do not resuscitate: Secondary | ICD-10-CM | POA: Diagnosis present

## 2020-01-13 DIAGNOSIS — I517 Cardiomegaly: Secondary | ICD-10-CM | POA: Diagnosis not present

## 2020-01-13 DIAGNOSIS — E876 Hypokalemia: Secondary | ICD-10-CM | POA: Diagnosis present

## 2020-01-13 DIAGNOSIS — N390 Urinary tract infection, site not specified: Secondary | ICD-10-CM | POA: Diagnosis present

## 2020-01-13 DIAGNOSIS — R5381 Other malaise: Secondary | ICD-10-CM | POA: Diagnosis present

## 2020-01-13 DIAGNOSIS — D751 Secondary polycythemia: Secondary | ICD-10-CM | POA: Diagnosis present

## 2020-01-13 DIAGNOSIS — M2550 Pain in unspecified joint: Secondary | ICD-10-CM | POA: Diagnosis not present

## 2020-01-13 DIAGNOSIS — I81 Portal vein thrombosis: Secondary | ICD-10-CM | POA: Diagnosis present

## 2020-01-13 DIAGNOSIS — Z515 Encounter for palliative care: Secondary | ICD-10-CM

## 2020-01-13 DIAGNOSIS — Z83438 Family history of other disorder of lipoprotein metabolism and other lipidemia: Secondary | ICD-10-CM

## 2020-01-13 DIAGNOSIS — R161 Splenomegaly, not elsewhere classified: Secondary | ICD-10-CM | POA: Diagnosis present

## 2020-01-13 DIAGNOSIS — K31811 Angiodysplasia of stomach and duodenum with bleeding: Secondary | ICD-10-CM | POA: Diagnosis present

## 2020-01-13 DIAGNOSIS — Z9109 Other allergy status, other than to drugs and biological substances: Secondary | ICD-10-CM

## 2020-01-13 DIAGNOSIS — D471 Chronic myeloproliferative disease: Secondary | ICD-10-CM | POA: Diagnosis not present

## 2020-01-13 DIAGNOSIS — C946 Myelodysplastic disease, not classified: Secondary | ICD-10-CM | POA: Diagnosis present

## 2020-01-13 DIAGNOSIS — E1142 Type 2 diabetes mellitus with diabetic polyneuropathy: Secondary | ICD-10-CM

## 2020-01-13 DIAGNOSIS — Z841 Family history of disorders of kidney and ureter: Secondary | ICD-10-CM

## 2020-01-13 DIAGNOSIS — I2510 Atherosclerotic heart disease of native coronary artery without angina pectoris: Secondary | ICD-10-CM | POA: Diagnosis present

## 2020-01-13 DIAGNOSIS — Z6836 Body mass index (BMI) 36.0-36.9, adult: Secondary | ICD-10-CM | POA: Diagnosis not present

## 2020-01-13 DIAGNOSIS — R1113 Vomiting of fecal matter: Secondary | ICD-10-CM

## 2020-01-13 DIAGNOSIS — R531 Weakness: Secondary | ICD-10-CM

## 2020-01-13 DIAGNOSIS — K766 Portal hypertension: Secondary | ICD-10-CM | POA: Diagnosis present

## 2020-01-13 DIAGNOSIS — R000 Tachycardia, unspecified: Secondary | ICD-10-CM | POA: Diagnosis not present

## 2020-01-13 DIAGNOSIS — Z86010 Personal history of colonic polyps: Secondary | ICD-10-CM

## 2020-01-13 DIAGNOSIS — K7460 Unspecified cirrhosis of liver: Secondary | ICD-10-CM | POA: Diagnosis present

## 2020-01-13 DIAGNOSIS — K7581 Nonalcoholic steatohepatitis (NASH): Secondary | ICD-10-CM | POA: Diagnosis present

## 2020-01-13 DIAGNOSIS — R52 Pain, unspecified: Secondary | ICD-10-CM | POA: Diagnosis not present

## 2020-01-13 DIAGNOSIS — B9689 Other specified bacterial agents as the cause of diseases classified elsewhere: Secondary | ICD-10-CM | POA: Diagnosis present

## 2020-01-13 DIAGNOSIS — R112 Nausea with vomiting, unspecified: Secondary | ICD-10-CM | POA: Diagnosis present

## 2020-01-13 DIAGNOSIS — R9389 Abnormal findings on diagnostic imaging of other specified body structures: Secondary | ICD-10-CM | POA: Diagnosis present

## 2020-01-13 DIAGNOSIS — R195 Other fecal abnormalities: Secondary | ICD-10-CM | POA: Diagnosis not present

## 2020-01-13 DIAGNOSIS — R627 Adult failure to thrive: Secondary | ICD-10-CM | POA: Diagnosis present

## 2020-01-13 DIAGNOSIS — Z20822 Contact with and (suspected) exposure to covid-19: Secondary | ICD-10-CM | POA: Diagnosis present

## 2020-01-13 DIAGNOSIS — Z881 Allergy status to other antibiotic agents status: Secondary | ICD-10-CM

## 2020-01-13 DIAGNOSIS — R710 Precipitous drop in hematocrit: Secondary | ICD-10-CM | POA: Diagnosis not present

## 2020-01-13 DIAGNOSIS — E1122 Type 2 diabetes mellitus with diabetic chronic kidney disease: Secondary | ICD-10-CM | POA: Diagnosis present

## 2020-01-13 DIAGNOSIS — E119 Type 2 diabetes mellitus without complications: Secondary | ICD-10-CM | POA: Diagnosis not present

## 2020-01-13 DIAGNOSIS — Z880 Allergy status to penicillin: Secondary | ICD-10-CM

## 2020-01-13 DIAGNOSIS — Z90710 Acquired absence of both cervix and uterus: Secondary | ICD-10-CM

## 2020-01-13 DIAGNOSIS — K8020 Calculus of gallbladder without cholecystitis without obstruction: Secondary | ICD-10-CM | POA: Diagnosis present

## 2020-01-13 DIAGNOSIS — N1832 Chronic kidney disease, stage 3b: Secondary | ICD-10-CM | POA: Diagnosis present

## 2020-01-13 DIAGNOSIS — D649 Anemia, unspecified: Secondary | ICD-10-CM | POA: Diagnosis present

## 2020-01-13 DIAGNOSIS — K3189 Other diseases of stomach and duodenum: Secondary | ICD-10-CM | POA: Diagnosis not present

## 2020-01-13 DIAGNOSIS — Z79899 Other long term (current) drug therapy: Secondary | ICD-10-CM

## 2020-01-13 DIAGNOSIS — D500 Iron deficiency anemia secondary to blood loss (chronic): Secondary | ICD-10-CM | POA: Diagnosis not present

## 2020-01-13 DIAGNOSIS — Z8249 Family history of ischemic heart disease and other diseases of the circulatory system: Secondary | ICD-10-CM

## 2020-01-13 DIAGNOSIS — R4182 Altered mental status, unspecified: Secondary | ICD-10-CM | POA: Diagnosis not present

## 2020-01-13 DIAGNOSIS — R0600 Dyspnea, unspecified: Secondary | ICD-10-CM | POA: Diagnosis not present

## 2020-01-13 DIAGNOSIS — Z833 Family history of diabetes mellitus: Secondary | ICD-10-CM

## 2020-01-13 DIAGNOSIS — M109 Gout, unspecified: Secondary | ICD-10-CM | POA: Diagnosis present

## 2020-01-13 LAB — CBC WITH DIFFERENTIAL/PLATELET
Abs Immature Granulocytes: 1.25 10*3/uL — ABNORMAL HIGH (ref 0.00–0.07)
Abs Immature Granulocytes: 0 10*3/uL (ref 0.00–0.07)
Basophils Absolute: 0.2 10*3/uL — ABNORMAL HIGH (ref 0.0–0.1)
Basophils Absolute: 0.6 10*3/uL — ABNORMAL HIGH (ref 0.0–0.1)
Basophils Relative: 1 %
Basophils Relative: 2 %
Eosinophils Absolute: 0 10*3/uL (ref 0.0–0.5)
Eosinophils Absolute: 0 10*3/uL (ref 0.0–0.5)
Eosinophils Relative: 0 %
Eosinophils Relative: 0 %
HCT: 41.3 % (ref 36.0–46.0)
HCT: 37.7 % (ref 36.0–46.0)
Hemoglobin: 13 g/dL (ref 12.0–15.0)
Hemoglobin: 11.8 g/dL — ABNORMAL LOW (ref 12.0–15.0)
Immature Granulocytes: 4 %
Lymphocytes Relative: 4 %
Lymphocytes Relative: 0 %
Lymphs Abs: 1.4 10*3/uL (ref 0.7–4.0)
Lymphs Abs: 0 10*3/uL — ABNORMAL LOW (ref 0.7–4.0)
MCH: 29 pg (ref 26.0–34.0)
MCH: 29.2 pg (ref 26.0–34.0)
MCHC: 31.5 g/dL (ref 30.0–36.0)
MCHC: 31.3 g/dL (ref 30.0–36.0)
MCV: 92.2 fL (ref 80.0–100.0)
MCV: 93.3 fL (ref 80.0–100.0)
Monocytes Absolute: 1.2 10*3/uL — ABNORMAL HIGH (ref 0.1–1.0)
Monocytes Absolute: 0.6 10*3/uL (ref 0.1–1.0)
Monocytes Relative: 4 %
Monocytes Relative: 2 %
Neutro Abs: 29.7 10*3/uL — ABNORMAL HIGH (ref 1.7–7.7)
Neutro Abs: 29.1 10*3/uL — ABNORMAL HIGH (ref 1.7–7.7)
Neutrophils Relative %: 87 %
Neutrophils Relative %: 96 %
Platelets: 316 10*3/uL (ref 150–400)
Platelets: 246 10*3/uL (ref 150–400)
RBC: 4.48 MIL/uL (ref 3.87–5.11)
RBC: 4.04 MIL/uL (ref 3.87–5.11)
RDW: 20.6 % — ABNORMAL HIGH (ref 11.5–15.5)
RDW: 20.5 % — ABNORMAL HIGH (ref 11.5–15.5)
WBC: 33.8 10*3/uL — ABNORMAL HIGH (ref 4.0–10.5)
WBC: 30.3 10*3/uL — ABNORMAL HIGH (ref 4.0–10.5)
nRBC: 0.4 % — ABNORMAL HIGH (ref 0.0–0.2)
nRBC: 0 /100 WBC
nRBC: 0.2 % (ref 0.0–0.2)

## 2020-01-13 LAB — COMPREHENSIVE METABOLIC PANEL
ALT: 11 U/L (ref 0–44)
ALT: 11 U/L (ref 0–44)
AST: 31 U/L (ref 15–41)
AST: 32 U/L (ref 15–41)
Albumin: 3.3 g/dL — ABNORMAL LOW (ref 3.5–5.0)
Albumin: 3.1 g/dL — ABNORMAL LOW (ref 3.5–5.0)
Alkaline Phosphatase: 95 U/L (ref 38–126)
Alkaline Phosphatase: 87 U/L (ref 38–126)
Anion gap: 16 — ABNORMAL HIGH (ref 5–15)
Anion gap: 14 (ref 5–15)
BUN: 8 mg/dL (ref 8–23)
BUN: 9 mg/dL (ref 8–23)
CO2: 20 mmol/L — ABNORMAL LOW (ref 22–32)
CO2: 22 mmol/L (ref 22–32)
Calcium: 9.1 mg/dL (ref 8.9–10.3)
Calcium: 8.8 mg/dL — ABNORMAL LOW (ref 8.9–10.3)
Chloride: 97 mmol/L — ABNORMAL LOW (ref 98–111)
Chloride: 99 mmol/L (ref 98–111)
Creatinine, Ser: 1.05 mg/dL — ABNORMAL HIGH (ref 0.44–1.00)
Creatinine, Ser: 1 mg/dL (ref 0.44–1.00)
GFR calc Af Amer: 58 mL/min — ABNORMAL LOW (ref >60)
GFR calc Af Amer: >60 mL/min (ref >60)
GFR calc non Af Amer: 50 mL/min — ABNORMAL LOW (ref >60)
GFR calc non Af Amer: 53 mL/min — ABNORMAL LOW (ref >60)
Glucose, Bld: 166 mg/dL — ABNORMAL HIGH (ref 70–99)
Glucose, Bld: 132 mg/dL — ABNORMAL HIGH (ref 70–99)
Potassium: 4.1 mmol/L (ref 3.5–5.1)
Potassium: 3.8 mmol/L (ref 3.5–5.1)
Sodium: 133 mmol/L — ABNORMAL LOW (ref 135–145)
Sodium: 135 mmol/L (ref 135–145)
Total Bilirubin: 2.6 mg/dL — ABNORMAL HIGH (ref 0.3–1.2)
Total Bilirubin: 2.5 mg/dL — ABNORMAL HIGH (ref 0.3–1.2)
Total Protein: 6.1 g/dL — ABNORMAL LOW (ref 6.5–8.1)
Total Protein: 5.9 g/dL — ABNORMAL LOW (ref 6.5–8.1)

## 2020-01-13 LAB — CBG MONITORING, ED
Glucose-Capillary: 140 mg/dL — ABNORMAL HIGH (ref 70–99)
Glucose-Capillary: 125 mg/dL — ABNORMAL HIGH (ref 70–99)
Glucose-Capillary: 155 mg/dL — ABNORMAL HIGH (ref 70–99)
Glucose-Capillary: 137 mg/dL — ABNORMAL HIGH (ref 70–99)

## 2020-01-13 LAB — PROTIME-INR
INR: 1.5 — ABNORMAL HIGH (ref 0.8–1.2)
Prothrombin Time: 18.2 seconds — ABNORMAL HIGH (ref 11.4–15.2)

## 2020-01-13 LAB — MAGNESIUM: Magnesium: 1.6 mg/dL — ABNORMAL LOW (ref 1.7–2.4)

## 2020-01-13 LAB — GLUCOSE, CAPILLARY
Glucose-Capillary: 130 mg/dL — ABNORMAL HIGH (ref 70–99)
Glucose-Capillary: 169 mg/dL — ABNORMAL HIGH (ref 70–99)

## 2020-01-13 LAB — LACTIC ACID, PLASMA: Lactic Acid, Venous: 2.3 mmol/L (ref 0.5–1.9)

## 2020-01-13 LAB — LIPASE, BLOOD: Lipase: 27 U/L (ref 11–51)

## 2020-01-13 LAB — AMMONIA: Ammonia: 43 umol/L — ABNORMAL HIGH (ref 9–35)

## 2020-01-13 LAB — PROCALCITONIN: Procalcitonin: 0.22 ng/mL

## 2020-01-13 LAB — SARS CORONAVIRUS 2 (TAT 6-24 HRS): SARS Coronavirus 2: NEGATIVE

## 2020-01-13 LAB — PHOSPHORUS: Phosphorus: 3.2 mg/dL (ref 2.5–4.6)

## 2020-01-13 LAB — POC SARS CORONAVIRUS 2 AG -  ED: SARS Coronavirus 2 Ag: NEGATIVE

## 2020-01-13 MED ORDER — IOHEXOL 300 MG/ML  SOLN
100.0000 mL | Freq: Once | INTRAMUSCULAR | Status: AC | PRN
  Administered 2020-01-13: 100 mL via INTRAVENOUS

## 2020-01-13 MED ORDER — TIMOLOL MALEATE 0.5 % OP SOLN
1.0000 [drp] | Freq: Every day | OPHTHALMIC | Status: DC
  Administered 2020-01-13 – 2020-01-25 (×13): 1 [drp] via OPHTHALMIC
  Filled 2020-01-13 (×3): qty 5

## 2020-01-13 MED ORDER — LACTULOSE 10 GM/15ML PO SOLN
20.0000 g | Freq: Two times a day (BID) | ORAL | Status: DC
  Administered 2020-01-13 – 2020-01-25 (×9): 20 g via ORAL
  Filled 2020-01-13 (×18): qty 30

## 2020-01-13 MED ORDER — PANTOPRAZOLE SODIUM 40 MG PO TBEC: 40.0000 mg | DELAYED_RELEASE_TABLET | Freq: Every day | ORAL | Status: DC

## 2020-01-13 MED ORDER — ONDANSETRON HCL 4 MG/2ML IJ SOLN
4.0000 mg | Freq: Once | INTRAMUSCULAR | Status: AC
  Administered 2020-01-13: 4 mg via INTRAVENOUS
  Filled 2020-01-13: qty 2

## 2020-01-13 MED ORDER — PROCHLORPERAZINE EDISYLATE 10 MG/2ML IJ SOLN
10.0000 mg | Freq: Once | INTRAMUSCULAR | Status: AC
  Administered 2020-01-13: 23:00:00 10 mg via INTRAVENOUS
  Filled 2020-01-13: qty 2

## 2020-01-13 MED ORDER — MIRTAZAPINE 15 MG PO TABS
15.0000 mg | ORAL_TABLET | Freq: Every day | ORAL | Status: DC
  Administered 2020-01-13 – 2020-01-24 (×12): 15 mg via ORAL
  Filled 2020-01-13 (×12): qty 1

## 2020-01-13 MED ORDER — PANTOPRAZOLE SODIUM 40 MG PO TBEC
40.0000 mg | DELAYED_RELEASE_TABLET | Freq: Every day | ORAL | Status: DC
  Administered 2020-01-14 – 2020-01-20 (×7): 40 mg via ORAL
  Filled 2020-01-13 (×8): qty 1

## 2020-01-13 MED ORDER — LATANOPROST 0.005 % OP SOLN
1.0000 [drp] | Freq: Every day | OPHTHALMIC | Status: DC
  Administered 2020-01-13 – 2020-01-24 (×12): 1 [drp] via OPHTHALMIC
  Filled 2020-01-13: qty 2.5

## 2020-01-13 MED ORDER — SODIUM CHLORIDE 0.9% FLUSH
3.0000 mL | Freq: Two times a day (BID) | INTRAVENOUS | Status: DC
  Administered 2020-01-13 – 2020-01-14 (×3): 3 mL via INTRAVENOUS

## 2020-01-13 MED ORDER — APIXABAN 5 MG PO TABS
5.0000 mg | ORAL_TABLET | Freq: Two times a day (BID) | ORAL | Status: DC
  Administered 2020-01-13 – 2020-01-19 (×13): 5 mg via ORAL
  Filled 2020-01-13 (×15): qty 1

## 2020-01-13 MED ORDER — SODIUM CHLORIDE 0.9 % IV BOLUS
1000.0000 mL | Freq: Once | INTRAVENOUS | Status: AC
  Administered 2020-01-13: 1000 mL via INTRAVENOUS

## 2020-01-13 MED ORDER — IOHEXOL 350 MG/ML SOLN: 75.0000 mL | Freq: Once | INTRAVENOUS | Status: DC | PRN

## 2020-01-13 MED ORDER — SODIUM CHLORIDE 0.9% FLUSH: 3.0000 mL | INTRAVENOUS | Status: DC | PRN

## 2020-01-13 MED ORDER — LACTULOSE 10 GM/15ML PO SOLN: 20.0000 g | Freq: Every day | ORAL | Status: DC

## 2020-01-13 MED ORDER — IOHEXOL 300 MG/ML  SOLN
75.0000 mL | Freq: Once | INTRAMUSCULAR | Status: AC | PRN
  Administered 2020-01-13: 75 mL via INTRAVENOUS

## 2020-01-13 MED ORDER — ONDANSETRON HCL 4 MG PO TABS: 4.0000 mg | ORAL_TABLET | Freq: Four times a day (QID) | ORAL | Status: DC | PRN

## 2020-01-13 MED ORDER — ONDANSETRON HCL 4 MG/2ML IJ SOLN
4.0000 mg | Freq: Four times a day (QID) | INTRAMUSCULAR | Status: DC | PRN
  Administered 2020-01-13 – 2020-01-14 (×3): 4 mg via INTRAVENOUS
  Filled 2020-01-13 (×3): qty 2

## 2020-01-13 MED ORDER — SODIUM CHLORIDE 0.9 % IV SOLN: 250.0000 mL | INTRAVENOUS | Status: DC | PRN

## 2020-01-13 MED ORDER — INSULIN ASPART 100 UNIT/ML ~~LOC~~ SOLN
0.0000 [IU] | SUBCUTANEOUS | Status: DC
  Administered 2020-01-13 – 2020-01-14 (×5): 1 [IU] via SUBCUTANEOUS
  Administered 2020-01-15: 2 [IU] via SUBCUTANEOUS
  Administered 2020-01-15 (×2): 1 [IU] via SUBCUTANEOUS
  Administered 2020-01-16 (×2): 2 [IU] via SUBCUTANEOUS
  Administered 2020-01-16: 20:00:00 3 [IU] via SUBCUTANEOUS
  Administered 2020-01-17 (×4): 2 [IU] via SUBCUTANEOUS
  Administered 2020-01-17: 22:00:00 3 [IU] via SUBCUTANEOUS
  Administered 2020-01-17 – 2020-01-18 (×3): 2 [IU] via SUBCUTANEOUS
  Administered 2020-01-18: 1 [IU] via SUBCUTANEOUS
  Administered 2020-01-18: 2 [IU] via SUBCUTANEOUS
  Administered 2020-01-18: 13:00:00 3 [IU] via SUBCUTANEOUS
  Administered 2020-01-18 – 2020-01-19 (×3): 2 [IU] via SUBCUTANEOUS
  Administered 2020-01-19 (×2): 1 [IU] via SUBCUTANEOUS
  Administered 2020-01-19 (×2): 2 [IU] via SUBCUTANEOUS
  Administered 2020-01-20 (×3): 1 [IU] via SUBCUTANEOUS
  Administered 2020-01-20: 13:00:00 2 [IU] via SUBCUTANEOUS

## 2020-01-13 NOTE — Progress Notes (Signed)
Scheduled medications given to patient but couldn't keep it down. Patient had emesis episode right after taking meds. PRN zofran IV given to patient. Will continue to monitor.

## 2020-01-13 NOTE — Progress Notes (Signed)
PROGRESS NOTE    Amanda [name]  GUY:[id number] DOB: [birth year] DOA: 01/13/2020 PCP: [name], NP   Brief Narrative:   Amanda [name] is Amanda [name] 79 y.o. 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  Since recent hospitalization, the patient has remained in bed most of the time, not eating or drinking much, and not taking her medications. Symptoms wax and wane, she is able to tolerate small bites and sips sometimes. She denies fever or chills. SNF had been recommended by PT and physicians prior to the recent discharge but patient wanted to go home and her husband thought he would be able to care for her there. She now wants to go [name] home. She had [name] visit with her PCP 2 days ago and it was advised that she return to the hospital in order to be [facility] in [facility].   ED Course: Upon arrival to the ED, patient is found to be afebrile, saturating 90s on room air, and with stable blood pressure.  EKG features sinus rhythm with rate 100 and PAC.  Chemistry panel with chronic mild renal insufficiency and bilirubin of 2.6.  CBC with stable chronic leukocytosis.  Covid antigen is negative.  CT of the abdomen and pelvis is negative for acute intra-abdominal or pelvic abnormalities.  Patient was given Lenell Lama liter of saline and Zofran in the ED.  Assessment & Plan:   Principal Problem:   Failure to thrive in adult Active Problems:   Cirrhosis ([place])   Type 2 diabetes mellitus with diabetic polyneuropathy (HCC)   CKD (chronic kidney disease), stage III   Portal vein thrombosis   Nausea & vomiting   Myeloproliferative disorder ([place])  1. Failure to thrive  - Presents from home where she is essentially  bed-bound and with ongoing nausea and loss of appetite, hoping to be placed in SNF  - CT abdomen with acute abnormality - cirrhosis with portal HTN, splenomegaly, small volume ascites, nonocclusive portal vein thrombus.  Small L pleural effusion. Hyperdensity in L urinary bladder, possible small bladder stone.  Abnormal endometrial thickening.   - Consider additional imaging - CT chest - Nausea improved spontaneously in ED and she was able to take some oral medications  - Consult palliative, continue supportive care    2. Decompensated cirrhosis  - Total bili up from recent hospitalization, MELD not accurate d/t affect of Eliquis on INR, overall appears stable, not encephalopathic   - Continue PPI and lactulose, hold diuretic given recent loss of appetite and N/V   3. Type II DM  - A1c was only 5.1% earlier this month   - Continue CBG checks and low-intensity SSI   4. CKD IIIa  - SCr is 1.05, looks to be close to baseline    5. Myeloproliferative disorder  - chronic leukocytosis, worse today - PCT .22, not clearly suggestive of infection - follow UA, additional imaging - jakafi currently on hold - Will c/s Amanda [name] on Monday  6. Portal vein thrombosis  - Continue Eliquis   # CAD: continue eliquis  # Hypertension: lasix and amlodipine on hold  # Glaucoma: continue eye drops  DVT prophylaxis: eliquis Code Status: DNR Family Communication: husband over phone Disposition Plan:  . Patient came from: home            .  Anticipated d/c place: SNF . Barriers to d/c OR conditions which need to be met to effect [facility] [name] safe d/c: pending improvement in PO intake   Consultants:   none  Procedures:   none  Antimicrobials Anti-infectives (From admission, onward)   None         Subjective: No appetite, weak  Objective: Vitals:   01/13/20 1100 01/13/20 1115 01/13/20 1130 01/13/20 1145  BP: (!) 155/82 (!) 145/77 (!) 156/82 (!) 166/73  Pulse: 100   98  Resp:        Temp:      TempSrc:      SpO2: 97%   97%   No intake or output data in the 24 hours ending 01/13/20 1616 There were no vitals filed for this visit.  Examination:  General exam: Appears calm and comfortable  Respiratory system: Clear to auscultation. Respiratory effort normal. Cardiovascular system: S1 & S2 heard, RRR. Gastrointestinal system: Abdomen is nondistended, soft and nontender.  Central nervous system: Alert and oriented. No focal neurological deficits. Extremities: trace edema Skin: No rashes, lesions or ulcers Psychiatry: Judgement and insight appear normal. Mood & affect appropriate.     Data Reviewed: I have personally reviewed following labs and imaging studies  CBC: Recent Labs  Lab 01/13/20 0055 01/13/20 0407  WBC 33.8* 30.3*  NEUTROABS 29.7* 29.1*  HGB 13.0 11.8*  HCT 41.3 37.7  MCV 92.2 93.3  PLT 316 500   Basic Metabolic Panel: Recent Labs  Lab 01/13/20 0055 01/13/20 0355  NA 133* 135  K 4.1 3.8  CL 97* 99  CO2 20* 22  GLUCOSE 166* 132*  BUN 8 9  CREATININE 1.05* 1.00  CALCIUM 9.1 8.8*  MG  --  1.6*  PHOS  --  3.2   GFR: CrCl cannot be calculated (Unknown ideal weight.). Liver Function Tests: Recent Labs  Lab 01/13/20 0055 01/13/20 0355  AST 31 32  ALT 11 11  ALKPHOS 95 87  BILITOT 2.6* 2.5*  PROT 6.1* 5.9*  ALBUMIN 3.3* 3.1*   Recent Labs  Lab 01/13/20 0055  LIPASE 27   Recent Labs  Lab 01/13/20 0055  AMMONIA 43*   Coagulation Profile: Recent Labs  Lab 01/13/20 0055  INR 1.5*   Cardiac Enzymes: No results for input(s): CKTOTAL, CKMB, CKMBINDEX, TROPONINI in the last 168 hours. BNP (last 3 results) No results for input(s): PROBNP in the last 8760 hours. HbA1C: No results for input(s): HGBA1C in the last 72 hours. CBG: Recent Labs  Lab 01/13/20 0407 01/13/20 0835 01/13/20 1346  GLUCAP 140* 125* 155*   Lipid Profile: No results for input(s): CHOL, HDL, LDLCALC, TRIG, CHOLHDL, LDLDIRECT in the last 72  hours. Thyroid Function Tests: No results for input(s): TSH, T4TOTAL, FREET4, T3FREE, THYROIDAB in the last 72 hours. Anemia Panel: No results for input(s): VITAMINB12, FOLATE, FERRITIN, TIBC, IRON, RETICCTPCT in the last 72 hours. Sepsis Labs: Recent Labs  Lab 01/13/20 0055 01/13/20 0355  PROCALCITON  --  0.22  LATICACIDVEN 2.3*  --     Recent Results (from the past 240 hour(s))  SARS CORONAVIRUS 2 (TAT 6-24 HRS) Nasopharyngeal Nasopharyngeal Swab     Status: None   Collection Time: 01/13/20  4:00 AM   Specimen: Nasopharyngeal Swab  Result Value Ref Range Status   SARS Coronavirus 2 NEGATIVE NEGATIVE Final    Comment: (NOTE) SARS-CoV-2 target nucleic acids are NOT DETECTED. The SARS-CoV-2 RNA is generally detectable in upper and lower respiratory specimens during the acute phase of infection.  Negative results do not preclude SARS-CoV-2 infection, do not rule out co-infections with other pathogens, and should not be used as the sole basis for treatment or other patient management decisions. Negative results must be combined with clinical observations, patient history, and epidemiological information. The expected result is Negative. Fact Sheet for Patients: SugarRoll.be Fact Sheet for Healthcare Providers: https://www.woods-mathews.com/ This test is not yet approved or cleared by the [place] FDA and  has been authorized for detection and/or diagnosis of SARS-CoV-2 by FDA under an Emergency Use Authorization (EUA). This EUA will remain  in effect (meaning this test can be used) for the duration of the COVID-19 declaration under Section 56 4(b)(1) of the Act, 21 U.S.C. section 360bbb-3(b)(1), unless the authorization is terminated or revoked sooner. Performed at [facility], [place] [address] [place], [place] [address]          Radiology Studies: CT ABDOMEN PELVIS W CONTRAST  Result Date: 01/13/2020 CLINICAL DATA:   Unspecified abdominal pain. Weakness. EXAM: CT ABDOMEN AND PELVIS WITH CONTRAST TECHNIQUE: Multidetector CT imaging of the abdomen and pelvis was performed using the standard protocol following bolus administration of intravenous contrast. CONTRAST:  119m OMNIPAQUE IOHEXOL 300 MG/ML  SOLN COMPARISON:  Hepatic protocol abdominal CT 2 weeks ago 12/30/2019. routine abdominal CT 12/28/2019 FINDINGS: Lower chest: Small left pleural effusion. Mitral annulus calcifications. Central line at the atrial caval junction from Britaney Espaillat superior approach. There are coronary artery calcifications. Hepatobiliary: Hepatic cirrhosis with nodular contours and diffusely decreased hepatic density. More geographic decreased density in the right lobe corresponds to that seen on prior CT. No new hepatic abnormality. Decreased gallbladder distension from prior exam. Small dependent gallstones again seen. No biliary dilatation. Pancreas: Parenchymal atrophy. No ductal dilatation or inflammation. Spleen: Splenomegaly with spleen spanning 15.9 cm AP. Rounded hypodensity in the superior spleen is unchanged. Additional small low-density lesion inferiorly is unchanged. Adrenals/Urinary Tract: Normal adrenal glands. No hydronephrosis or perinephric edema. Homogeneous renal enhancement. Symmetric renal excretion on delayed phase imaging. Urinary bladder is physiologically distended without wall thickening. Small dependent hyperdensity in the left urinary trigone may represent small bladder stone. Stomach/Bowel: Small hiatal hernia. Stomach is nondistended. No bowel obstruction or bowel inflammation. Appendix not confidently visualized. No evidence of appendicitis. Mild distal colonic diverticulosis without diverticulitis. Vascular/Lymphatic: Nonocclusive thrombus within the main portal vein extending to the portal splenic confluence. No significant change from prior exam. Intrahepatic portal veins are dilated. Splenic vein is dilated but patent. No  mesenteric venous thrombus. Aortic atherosclerosis. No aortic aneurysm. No bulky abdominopelvic lymph nodes. Reproductive: Endometrial thickening again seen. No adnexal mass. Other: Small volume abdominopelvic ascites. No free intra-abdominal air. Small fat containing umbilical hernia. Rounded subcutaneous fluid density structure in the subcutaneous tissues lateral to the right hip may be Emagene Merfeld sebaceous cyst, unchanged. Musculoskeletal: There are no acute or suspicious osseous abnormalities. Possible avascular necrosis of the left femoral head. IMPRESSION: 1. No acute abnormality in the abdomen/pelvis. 2. Hepatic cirrhosis with portal hypertension, splenomegaly, and small volume abdominopelvic ascites. Unchanged nonocclusive thrombus in the main portal vein. 3. Small left pleural effusion. 4. Small dependent hyperdensity in the left urinary bladder may represent Jamis Kryder small bladder stone. 5. Abnormal endometrial thickening again seen for patient of this age. 6. Additional chronic findings as described. Aortic Atherosclerosis (ICD10-I70.0). Electronically Signed   By: MKeith RakeM.D.   On: 01/13/2020 02:42   DG Chest Port 1 View  Result Date: 01/13/2020 CLINICAL DATA:  Shortness of breath and vomiting EXAM: PORTABLE  CHEST 1 VIEW COMPARISON:  December 31, 2019 FINDINGS: The heart size and mediastinal contours are unchanged with mild cardiomegaly. Aortic knob calcifications. Karessa Onorato right-sided MediPort catheter seen with the tip at the superior cavoatrial junction. Probable subsegmental atelectasis seen at both lung bases. No large airspace consolidation or pleural effusion. IMPRESSION: Subsegmental atelectasis of both lung bases. Electronically Signed   By: Prudencio Pair M.D.   On: 01/13/2020 01:12        Scheduled Meds: . apixaban  5 mg Oral BID  . insulin aspart  0-9 Units Subcutaneous Q4H  . lactulose  20 g Oral BID  . pantoprazole  40 mg Oral Daily  . sodium chloride flush  3 mL Intravenous Q12H    Continuous Infusions: . sodium chloride       LOS: 0 days    Time spent: over 30 min    Fayrene Helper, MD [facility]   To contact the attending provider between 7A-7P or the covering provider during after hours 7P-7A, please log into the web site [email or web address] and access using universal [place] password for that web site. If you do not have the password, please call the hospital operator.  01/13/2020, 4:16 PM

## 2020-01-13 NOTE — ED Notes (Signed)
Informed RN Aimee POC covid test neg

## 2020-01-13 NOTE — ED Notes (Signed)
Unsuccessful blood draw attempt. RN Aimee aware

## 2020-01-13 NOTE — H&P (Signed)
History and Physical    LILLEIGH HECHAVARRIA PXT:[id number] DOB: November 27, 1940 DOA: 01/13/2020  PCP: [name], NP   Patient coming from: Home   Chief Complaint: Not eating or drinking   HPI: Amanda [name] is a 79 y.o. 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  Since recent hospitalization, the patient has remained in bed most of the time, not eating or drinking much, and not taking her medications. Symptoms wax and wane, she is able to tolerate small bites and sips sometimes. She denies fever or chills. SNF had been recommended by PT and physicians prior to the recent discharge but patient wanted to go home and her husband thought he would be able to care for her there. She now wants to go a nursing home. She had a visit with her PCP 2 days ago and it was advised that she return to the hospital in order to be placed in a nursing facility.   ED Course: Upon arrival to the ED, patient is found to be afebrile, saturating 90s on room air, and with stable blood pressure.  EKG features sinus rhythm with rate 100 and PAC.  Chemistry panel with chronic mild renal insufficiency and bilirubin of 2.6.  CBC with stable chronic leukocytosis.  Covid antigen is negative.  CT of the abdomen and pelvis is negative for acute intra-abdominal or pelvic abnormalities.  Patient was given a liter of saline and Zofran in the ED.  Review of Systems:  All other systems reviewed and apart from HPI, are negative.  Past Medical History:  Diagnosis Date  . Allergic rhinitis 01/23/2016  . C. difficile colitis   . CAD (coronary artery disease)    a. s/p STEMI in 01/2015 with 95% LCx stenosis and distal 80% LCx stenosis (DESx2 placed)    . Cancer ([place])    SKIN  . Candidiasis of skin 09/30/2014  . Cirrhosis ([place])   . Depression   . Diabetes mellitus 2008  . Gout   . Herpes simplex   . Hyperglycemia 05/31/2013  . Hyperlipidemia   . Hypertension   . Myocardial infarction ([place])   . 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 Obesity   . Polycythemia    Dr. Elease Hashimoto- [facility]  . Psoriasis     Past Surgical History:  Procedure Laterality Date  . ANKLE FRACTURE SURGERY    . BIOPSY  05/26/2018   Procedure: BIOPSY;  Surgeon: [name], MD;  Location: [facility] ENDOSCOPY;  Service: Endoscopy;;  . BREAST SURGERY Left    milk duct  . CATARACT EXTRACTION, BILATERAL    . COLONOSCOPY    . ESOPHAGOGASTRODUODENOSCOPY N/A 05/26/2018   Procedure: ESOPHAGOGASTRODUODENOSCOPY (EGD);  Surgeon: [name], MD;  Location[facility] ENDOSCOPY;  Service: Endoscopy;  Laterality: N/A;  . IR IMAGING GUIDED PORT INSERTION  08/30/2019  . IR PARACENTESIS  05/25/2018  . LEFT HEART CATHETERIZATION WITH CORONARY ANGIOGRAM N/A 01/27/2015   Procedure: LEFT HEART CATHETERIZATION WITH CORONARY ANGIOGRAM;  Surgeon: [name], MD;  Location: [facility] CATH LAB;  Service: Cardiovascular;  Laterality: N/A;  . TONSILLECTOMY    . TOTAL ABDOMINAL HYSTERECTOMY W/ BILATERAL SALPINGOOPHORECTOMY     for heavy periods with appendectomy     reports that she quit smoking about  33 years ago. Her smoking use included cigarettes. She quit after 48.00 years of use. She has never used smokeless tobacco. She reports that she does not drink alcohol or use drugs.  Allergies  Allergen Reactions  . Penicillins Swelling    Has patient had a PCN reaction causing immediate rash, facial/tongue/throat swelling, SOB or lightheadedness with hypotension: No Has patient had a PCN reaction causing severe rash involving mucus membranes or skin necrosis: No Has patient had a PCN reaction that required hospitalization: No Has patient had a PCN reaction occurring within the last 10  years: No If all of the above answers are "NO", then may proceed with Cephalosporin use.  Marland Kitchen Lisinopril Cough  . Tape Hives  . Doxycycline Hives, Swelling and Rash  . Latex Hives, Itching and Rash    Family History  Problem Relation Age of Onset  . Diabetes Mother   . Heart disease Mother        CAD  . Hyperlipidemia Mother   . Hypertension Mother   . Kidney disease Mother   . Kidney disease Father   . Breast cancer Maternal Aunt   . Heart disease Maternal Grandmother   . Heart disease Other        maternal aunts and uncles  . Colon cancer Neg Hx   . Esophageal cancer Neg Hx      Prior to Admission medications   Medication Sig Start Date End Date Taking? Authorizing Provider  apixaban (ELIQUIS) 5 MG TABS tablet Take 1 tablet (5 mg total) by mouth 2 (two) times daily. 01/10/20 04/09/20 Yes Nafziger, Tommi Rumps, NP  Dulaglutide (TRULICITY [place]) Inject 1 pen into the skin once a week. Fridays   Yes [provider]  JAKAFI 25 MG tablet TAKE 1 TABLET (25 MG TOTAL) BY MOUTH 2 TIMES DAILY. Patient taking differently: Take 25 mg by mouth 2 (two) times daily.  09/05/19  Yes Ennever, Rudell Cobb, MD  acetaminophen (TYLENOL) 325 MG tablet Take 2 tablets (650 mg total) by mouth every 6 (six) hours as needed for mild pain (or Fever >/= 101). 05/27/18   Georgette Shell, MD  amLODipine (NORVASC) 5 MG tablet Take 1 tablet (5 mg total) by mouth daily. 09/19/19 12/28/19  [name], MD  Apixaban Starter Pack (ELIQUIS STARTER PACK) 5 MG TBPK Take as directed on package: start with two-76m tablets twice daily for 7 days. On day 8, switch to one-58mtablet twice daily. Patient not taking: Reported on 01/13/2020 01/02/20   PoFlora LippsMD  Blood Glucose Monitoring Suppl (ACCU-CHEK GUIDE) w/Device KIT 1 kit by Does not apply route as directed. 01/25/18   GhPhilemon KingdomMD  diazepam (VALIUM) 5 MG tablet Valium 5 mg by mouth at bedtime as needed. Patient taking differently: Take 5 mg by mouth at  bedtime as needed for anxiety.  08/24/19   EnVolanda NapoleonMD  ezetimibe (ZETIA) 10 MG tablet TAKE 1 TABLET(10 MG) BY MOUTH DAILY Patient taking differently: Take 10 mg by mouth daily.  11/07/19   KeTroy SineMD  fluconazole (DIFLUCAN) 150 MG tablet Take one tab weekly for two weeks Patient taking differently: Take 150 mg by mouth See admin instructions. Take one tab weekly for two weeks prn 12/11/19   Nafziger, CoTommi RumpsNP  furosemide (LASIX) 40 MG tablet TAKE 1 TABLET(40 MG) BY MOUTH DAILY Patient taking differently: Take 40 mg by mouth daily.  10/16/19   KeTroy SineMD  glucose blood (ACCU-CHEK GUIDE) test strip  Use to check blood sugars 3 times daily 01/25/18   [name], MD  Insulin Pen Needle (BD PEN NEEDLE NANO U/F) 32G X 4 MM MISC Use to inject insulin 2 times a day Patient taking differently: 90 Units daily.  09/07/19   [name], MD  insulin regular human CONCENTRATED (HUMULIN R U-500 KWIKPEN) 500 UNIT/ML kwikpen Inject 90 Units into the skin daily.    [provider]  lactulose (CHRONULAC) 10 GM/15ML solution Take 30 mLs (20 g total) by mouth daily. 01/03/20   Pokhrel, Laxman, MD  latanoprost (XALATAN) 0.005 % ophthalmic solution Place 1 drop into both eyes at bedtime.  03/23/19   [provider]  lidocaine-prilocaine (EMLA) cream Apply 1 application topically as needed. Please apply EMLA over the Port-A-Cath site 1 hour prior to coming to our office. 08/17/19   Volanda Napoleon, MD  mirtazapine (REMERON) 15 MG tablet Take 1 tablet (15 mg total) by mouth at bedtime. Patient taking differently: Take 30 mg by mouth at bedtime.  08/17/19   Nafziger, Tommi Rumps, NP  ONETOUCH VERIO test strip USE TO TEST BLOOD SUGAR THREE TIMES DAILY 05/01/18   [name], MD  oxyCODONE-acetaminophen (PERCOCET/ROXICET) 5-325 MG tablet Take 1-2 tablets by mouth every 8 (eight) hours as needed for severe pain. 09/28/19   Nafziger, Tommi Rumps, NP  pantoprazole (PROTONIX) 40 MG  tablet Take 1 tablet (40 mg total) by mouth daily. 01/03/20   Pokhrel, Corrie Mckusick, MD  potassium chloride (KLOR-CON M10) 10 MEQ tablet Take 1 tablet (10 mEq total) by mouth 2 (two) times daily. Patient taking differently: Take 10 mEq by mouth daily.  10/10/19   Milus Banister, MD  promethazine (PHENERGAN) 25 MG tablet Take 0.5 tablets (12.5 mg total) by mouth every 6 (six) hours as needed for nausea or vomiting. 05/28/18   Emokpae, Ejiroghene E, MD  timolol (TIMOPTIC) 0.5 % ophthalmic solution 1 drop daily. 10/07/19   [provider]  triamcinolone cream (KENALOG) 0.1 % Apply 1 application topically 2 (two) times daily. 12/11/19   [name], NP    Physical Exam: Vitals:   01/13/20 0123 01/13/20 0125 01/13/20 0130 01/13/20 0241  BP:   (!) 148/68 (!) 144/71  Pulse:   94 94  Resp:   (!) 22 17  Temp:      TempSrc:      SpO2: (!) 88% 94% 96% 93%    Constitutional: NAD, calm  Eyes: PERTLA, lids and conjunctivae normal ENMT: Mucous membranes are moist. Posterior pharynx clear of any exudate or lesions.   Neck: normal, supple, no masses, no thyromegaly Respiratory: no wheezing, no crackles. No accessory muscle use.  Cardiovascular: S1 & S2 heard, regular rate and rhythm. No extremity edema.  Abdomen: No distension, no tenderness, soft. Bowel sounds active.  Musculoskeletal: no clubbing / cyanosis. No joint deformity upper and lower extremities.   Skin: no significant rashes, lesions, ulcers. Warm, dry, well-perfused. Neurologic: No facial asymmetry, no dysarthria. Sensation intact. Moving all extremities.  Psychiatric: Alert and oriented to person, place, and situation. Pleasant and cooperative.    Labs and Imaging on Admission: I have personally reviewed following labs and imaging studies  CBC: Recent Labs  Lab 01/13/20 0055  WBC 33.8*  NEUTROABS 29.7*  HGB 13.0  HCT 41.3  MCV 92.2  PLT 546   Basic Metabolic Panel: Recent Labs  Lab 01/13/20 0055  NA 133*  K 4.1    CL 97*  CO2 20*  GLUCOSE 166*  BUN 8  CREATININE  1.05*  CALCIUM 9.1   GFR: CrCl cannot be calculated (Unknown ideal weight.). Liver Function Tests: Recent Labs  Lab 01/13/20 0055  AST 31  ALT 11  ALKPHOS 95  BILITOT 2.6*  PROT 6.1*  ALBUMIN 3.3*   Recent Labs  Lab 01/13/20 0055  LIPASE 27   Recent Labs  Lab 01/13/20 0055  AMMONIA 43*   Coagulation Profile: Recent Labs  Lab 01/13/20 0055  INR 1.5*   Cardiac Enzymes: No results for input(s): CKTOTAL, CKMB, CKMBINDEX, TROPONINI in the last 168 hours. BNP (last 3 results) No results for input(s): PROBNP in the last 8760 hours. HbA1C: No results for input(s): HGBA1C in the last 72 hours. CBG: No results for input(s): GLUCAP in the last 168 hours. Lipid Profile: No results for input(s): CHOL, HDL, LDLCALC, TRIG, CHOLHDL, LDLDIRECT in the last 72 hours. Thyroid Function Tests: No results for input(s): TSH, T4TOTAL, FREET4, T3FREE, THYROIDAB in the last 72 hours. Anemia Panel: No results for input(s): VITAMINB12, FOLATE, FERRITIN, TIBC, IRON, RETICCTPCT in the last 72 hours. Urine analysis:    Component Value Date/Time   COLORURINE YELLOW 08/10/2019 2008   APPEARANCEUR CLEAR 08/10/2019 2008   LABSPEC 1.008 08/10/2019 2008   PHURINE 5.0 08/10/2019 2008   GLUCOSEU NEGATIVE 08/10/2019 2008   GLUCOSEU NEGATIVE 01/10/2018 1217   HGBUR SMALL (A) 08/10/2019 2008   HGBUR large 05/07/2010 0845   BILIRUBINUR NEGATIVE 08/10/2019 2008   BILIRUBINUR neg 01/26/2016 1003   KETONESUR NEGATIVE 08/10/2019 2008   PROTEINUR NEGATIVE 08/10/2019 2008   UROBILINOGEN 0.2 01/10/2018 1217   NITRITE NEGATIVE 08/10/2019 2008   LEUKOCYTESUR TRACE (A) 08/10/2019 2008   Sepsis Labs: @LABRCNTIP (procalcitonin:4,lacticidven:4) )No results found for this or any previous visit (from the past 240 hour(s)).   Radiological Exams on Admission: CT ABDOMEN PELVIS W CONTRAST  Result Date: 01/13/2020 CLINICAL DATA:  Unspecified abdominal  pain. Weakness. EXAM: CT ABDOMEN AND PELVIS WITH CONTRAST TECHNIQUE: Multidetector CT imaging of the abdomen and pelvis was performed using the standard protocol following bolus administration of intravenous contrast. CONTRAST:  154m OMNIPAQUE IOHEXOL 300 MG/ML  SOLN COMPARISON:  Hepatic protocol abdominal CT 2 weeks ago 12/30/2019. routine abdominal CT 12/28/2019 FINDINGS: Lower chest: Small left pleural effusion. Mitral annulus calcifications. Central line at the atrial caval junction from a superior approach. There are coronary artery calcifications. Hepatobiliary: Hepatic cirrhosis with nodular contours and diffusely decreased hepatic density. More geographic decreased density in the right lobe corresponds to that seen on prior CT. No new hepatic abnormality. Decreased gallbladder distension from prior exam. Small dependent gallstones again seen. No biliary dilatation. Pancreas: Parenchymal atrophy. No ductal dilatation or inflammation. Spleen: Splenomegaly with spleen spanning 15.9 cm AP. Rounded hypodensity in the superior spleen is unchanged. Additional small low-density lesion inferiorly is unchanged. Adrenals/Urinary Tract: Normal adrenal glands. No hydronephrosis or perinephric edema. Homogeneous renal enhancement. Symmetric renal excretion on delayed phase imaging. Urinary bladder is physiologically distended without wall thickening. Small dependent hyperdensity in the left urinary trigone may represent small bladder stone. Stomach/Bowel: Small hiatal hernia. Stomach is nondistended. No bowel obstruction or bowel inflammation. Appendix not confidently visualized. No evidence of appendicitis. Mild distal colonic diverticulosis without diverticulitis. Vascular/Lymphatic: Nonocclusive thrombus within the main portal vein extending to the portal splenic confluence. No significant change from prior exam. Intrahepatic portal veins are dilated. Splenic vein is dilated but patent. No mesenteric venous thrombus.  Aortic atherosclerosis. No aortic aneurysm. No bulky abdominopelvic lymph nodes. Reproductive: Endometrial thickening again seen. No adnexal mass. Other: Small volume abdominopelvic  ascites. No free intra-abdominal air. Small fat containing umbilical hernia. Rounded subcutaneous fluid density structure in the subcutaneous tissues lateral to the right hip may be a sebaceous cyst, unchanged. Musculoskeletal: There are no acute or suspicious osseous abnormalities. Possible avascular necrosis of the left femoral head. IMPRESSION: 1. No acute abnormality in the abdomen/pelvis. 2. Hepatic cirrhosis with portal hypertension, splenomegaly, and small volume abdominopelvic ascites. Unchanged nonocclusive thrombus in the main portal vein. 3. Small left pleural effusion. 4. Small dependent hyperdensity in the left urinary bladder may represent a small bladder stone. 5. Abnormal endometrial thickening again seen for patient of this age. 6. Additional chronic findings as described. Aortic Atherosclerosis (ICD10-I70.0). Electronically Signed   By: Keith Rake M.D.   On: 01/13/2020 02:42   DG Chest Port 1 View  Result Date: 01/13/2020 CLINICAL DATA:  Shortness of breath and vomiting EXAM: PORTABLE CHEST 1 VIEW COMPARISON:  December 31, 2019 FINDINGS: The heart size and mediastinal contours are unchanged with mild cardiomegaly. Aortic knob calcifications. A right-sided MediPort catheter seen with the tip at the superior cavoatrial junction. Probable subsegmental atelectasis seen at both lung bases. No large airspace consolidation or pleural effusion. IMPRESSION: Subsegmental atelectasis of both lung bases. Electronically Signed   By: Prudencio Pair M.D.   On: 01/13/2020 01:12    EKG: Independently reviewed. Sinus rhythm, rate 100, PAC.   Assessment/Plan   1. Failure to thrive  - Presents from home where she is essentially bed-bound and with ongoing nausea and loss of appetite, hoping to be placed in SNF  - Nausea  improved spontaneously in ED and she was able to take some oral medications  - Consult palliative, continue supportive care    2. Decompensated cirrhosis  - Total bili up from recent hospitalization, MELD not accurate d/t affect of Eliquis on INR, overall appears stable, not encephalopathic   - Continue PPI and lactulose, hold diuretic given recent loss of appetite and N/V   3. Type II DM  - A1c was only 5.1% earlier this month   - Continue CBG checks and low-intensity SSI   4. CKD IIIa  - SCr is 1.05, looks to be close to baseline    5. Myeloproliferative disorder  - Appears stable    6. Portal vein thrombosis  - Continue Eliquis   DVT prophylaxis: Eliquis   Code Status: DNR   Family Communication: Husband updated in ED   Disposition Plan: Pending palliative consultation  Consults called: None  Admission status: Observation     Vianne Bulls, MD [facility] Pager: See [email or web address]  If 7AM-7PM, please contact the daytime attending [email or web address]  01/13/2020, 3:55 AM

## 2020-01-13 NOTE — ED Notes (Signed)
Patient resting comfortably with eyes closed at this time.  NAD

## 2020-01-13 NOTE — ED Provider Notes (Signed)
[facility] EMERGENCY DEPARTMENT Provider Note   CSN: [id number] Arrival date & time: 01/13/20  0037     History Chief Complaint  Patient presents with  . Fatigue    Amanda [name] is a 79 y.o. female.  79 yo F with a chief complaints of failure to thrive.  Patient has essentially not been eating and drinking for about 6 weeks now.  Has had persistent vomiting with anything that she eats or drinks.  Was recently in the hospital and found to have possible pneumonia as well as a hepatic vein thrombosis.  Since she has been out of the hospital she is not significantly improved though at least based on the PCP notes from a couple days ago she has not significantly worsened.  Going to the patient she is significantly fatigued and having trouble getting around the house.  She has had worsening cough.  Has been having significant sputum.  No reported fevers.  No chest pain or shortness of breath.  Denies abdominal pain currently.  The history is provided by the patient.  Illness Severity:  Moderate Onset quality:  Gradual Duration:  6 weeks Timing:  Constant Progression:  Worsening Chronicity:  New Associated symptoms: cough, nausea, shortness of breath and vomiting   Associated symptoms: no chest pain, no congestion, no fever, no headaches, no myalgias, no rhinorrhea and no wheezing        Past Medical History:  Diagnosis Date  . Allergic rhinitis 01/23/2016  . C. difficile colitis   . CAD (coronary artery disease)    a. s/p STEMI in 01/2015 with 95% LCx stenosis and distal 80% LCx stenosis (DESx2 placed)  . Cancer ([place])    SKIN  . Candidiasis of skin 09/30/2014  . Cirrhosis ([place])   . Depression   . Diabetes mellitus 2008  . Gout   . Herpes simplex   . Hyperglycemia 05/31/2013  . Hyperlipidemia   . Hypertension   . Myocardial infarction ([place])   . Neuromuscular disorder ([place])    BELL PALSY  . Obesity   . Polycythemia    Dr. Elease Hashimoto- [facility]  .  Psoriasis     Patient Active Problem List   Diagnosis Date Noted  . Nausea & vomiting 01/13/2020  . Failure to thrive in adult 01/13/2020  . 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 Portal vein thrombosis   . SIRS (systemic inflammatory response syndrome) ([place]) 12/28/2019  . Worsening lower extremity weakness 12/14/2019  . Leg swelling 08/21/2019  . Hypotension due to hypovolemia 08/10/2019  . CKD (chronic kidney disease), stage III 08/10/2019  . Acute blood loss anemia 05/23/2018  . Dehydration 03/22/2018  . History of CVA (cerebrovascular accident) 03/22/2018  . Hip injury, left, subsequent encounter 11/08/2017  . Iron deficiency anemia 12/22/2016  . OSA (obstructive sleep apnea) 04/21/2016  . Osteopenia 04/07/2016  . Lung nodule, solitary 02/05/2016  . Aortic dilatation ([place]) 02/05/2016  . Dyspnea 01/28/2016  . Cirrhosis ([place]) 01/28/2016  . Allergic rhinitis 01/23/2016  . Hepatic cirrhosis ([place]) 10/28/2015  . Irritable bowel syndrome 08/12/2015  . Obesity (BMI 30-39.9) 04/30/2015  . Diabetic retinopathy of both eyes ([place]) 04/18/2015  . Former smoker 04/18/2015  . GAD (generalized anxiety disorder) 04/18/2015  . Status post primary angioplasty with coronary stent 04/18/2015  . Coronary artery disease involving native coronary artery 03/01/2015  . Chronic diastolic CHF (congestive heart failure) ([place]) 02/01/2015  . ST elevation myocardial infarction (STEMI) involving left circumflex coronary artery in recovery phase ([place])  01/27/2015  . Idioventricular rhythm ([place])   . Diabetic peripheral neuropathy associated with type 2 diabetes mellitus ([place]) 01/23/2015  . Type 2 diabetes mellitus with diabetic polyneuropathy ([place]) 01/23/2015  . Thrombocytosis ([place]) 07/04/2014  . Cholelithiasis 02/07/2014  . Chronic diarrhea 01/29/2014  . Leukocytosis 06/02/2013  . [name] ([place]) 05/31/2013  . Essential hypertension 05/31/2013  . History of Bell's palsy 05/26/2013  .  DERMATITIS, ATOPIC 11/16/2010  . DIZZINESS 11/16/2010  . DIASTOLIC DYSFUNCTION 82/42/3536  . Hyperlipidemia 12/15/2009  . Essential hypertension, benign 11/24/2009  . PSORIASIS 11/24/2009  . Depression 09/13/2009  . 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 ANKLE FRACTURE SURGERY    . BIOPSY  05/26/2018   Procedure: BIOPSY;  Surgeon: [name], MD;  Location[facility] ENDOSCOPY;  Service: Endoscopy;;  . BREAST SURGERY Left    milk duct  . CATARACT EXTRACTION, BILATERAL    . COLONOSCOPY    . ESOPHAGOGASTRODUODENOSCOPY N/A 05/26/2018   Procedure: ESOPHAGOGASTRODUODENOSCOPY (EGD);  Surgeon: [name], MD;  Location: [facility] ENDOSCOPY;  Service: Endoscopy;  Laterality: N/A;  . IR IMAGING GUIDED PORT INSERTION  08/30/2019  . IR PARACENTESIS  05/25/2018  . LEFT HEART CATHETERIZATION WITH CORONARY ANGIOGRAM N/A 01/27/2015   Procedure: LEFT HEART CATHETERIZATION WITH CORONARY ANGIOGRAM;  Surgeon: [name], MD;  Location: [facility] CATH LAB;  Service: Cardiovascular;  Laterality: N/A;  . TONSILLECTOMY    . TOTAL ABDOMINAL HYSTERECTOMY W/ BILATERAL SALPINGOOPHORECTOMY     for heavy periods with appendectomy     OB History   No obstetric history on file.     Family History  Problem Relation Age of Onset  . Diabetes Mother   . Heart disease Mother        CAD  . Hyperlipidemia Mother   . Hypertension Mother   . Kidney disease Mother   . Kidney disease Father   . Breast cancer Maternal Aunt   . Heart disease Maternal Grandmother   . Heart disease Other        maternal aunts and uncles  . Colon cancer Neg Hx   . Esophageal cancer Neg Hx     Social History   Tobacco Use  . Smoking status: Former Smoker    Years: 48.00    Types: Cigarettes    Quit date: 10/18/1986    Years since quitting: 33.2  . Smokeless tobacco: Never Used  Substance Use Topics  . Alcohol use: No    Alcohol/week: 0.0 standard drinks    . Drug use: No    Home Medications Prior to Admission medications   Medication Sig Start Date End Date Taking? Authorizing Provider  apixaban (ELIQUIS) 5 MG TABS tablet Take 1 tablet (5 mg total) by mouth 2 (two) times daily. 01/10/20 04/09/20 Yes Nafziger, Tommi Rumps, NP  Dulaglutide (TRULICITY [place]) Inject 1 pen into the skin once a week. Fridays   Yes [provider]  JAKAFI 25 MG tablet TAKE 1 TABLET (25 MG TOTAL) BY MOUTH 2 TIMES DAILY. Patient taking differently: Take 25 mg by mouth 2 (two) times daily.  09/05/19  Yes Ennever, Rudell Cobb, MD  acetaminophen (TYLENOL) 325 MG tablet Take 2 tablets (650 mg total) by mouth every 6 (six) hours as needed for mild pain (or Fever >/= 101). 05/27/18   Georgette Shell, MD  amLODipine (NORVASC) 5 MG tablet Take 1 tablet (5 mg total) by mouth daily. 09/19/19 12/28/19  [name],  MD  Apixaban Starter Pack (ELIQUIS STARTER PACK) 5 MG TBPK Take as directed on package: start with two-24m tablets twice daily for 7 days. On day 8, switch to one-579mtablet twice daily. Patient not taking: Reported on 01/13/2020 01/02/20   PoFlora LippsMD  Blood Glucose Monitoring Suppl (ACCU-CHEK GUIDE) w/Device KIT 1 kit by Does not apply route as directed. 01/25/18   GhPhilemon KingdomMD  diazepam (VALIUM) 5 MG tablet Valium 5 mg by mouth at bedtime as needed. Patient taking differently: Take 5 mg by mouth at bedtime as needed for anxiety.  08/24/19   EnVolanda NapoleonMD  ezetimibe (ZETIA) 10 MG tablet TAKE 1 TABLET(10 MG) BY MOUTH DAILY Patient taking differently: Take 10 mg by mouth daily.  11/07/19   KeTroy SineMD  fluconazole (DIFLUCAN) 150 MG tablet Take one tab weekly for two weeks Patient taking differently: Take 150 mg by mouth See admin instructions. Take one tab weekly for two weeks prn 12/11/19   Nafziger, CoTommi RumpsNP  furosemide (LASIX) 40 MG tablet TAKE 1 TABLET(40 MG) BY MOUTH DAILY Patient taking differently: Take 40 mg by mouth daily.  10/16/19    KeTroy SineMD  glucose blood (ACCU-CHEK GUIDE) test strip Use to check blood sugars 3 times daily 01/25/18   GhPhilemon KingdomMD  Insulin Pen Needle (BD PEN NEEDLE NANO U/F) 32G X 4 MM MISC Use to inject insulin 2 times a day Patient taking differently: 90 Units daily.  09/07/19   GhPhilemon KingdomMD  insulin regular human CONCENTRATED (HUMULIN R U-500 KWIKPEN) 500 UNIT/ML kwikpen Inject 90 Units into the skin daily.    [provider]  lactulose (CHRONULAC) 10 GM/15ML solution Take 30 mLs (20 g total) by mouth daily. 01/03/20   Pokhrel, Laxman, MD  latanoprost (XALATAN) 0.005 % ophthalmic solution Place 1 drop into both eyes at bedtime.  03/23/19   [provider]  lidocaine-prilocaine (EMLA) cream Apply 1 application topically as needed. Please apply EMLA over the Port-A-Cath site 1 hour prior to coming to our office. 08/17/19   EnVolanda NapoleonMD  mirtazapine (REMERON) 15 MG tablet Take 1 tablet (15 mg total) by mouth at bedtime. Patient taking differently: Take 30 mg by mouth at bedtime.  08/17/19   Nafziger, CoTommi RumpsNP  ONETOUCH VERIO test strip USE TO TEST BLOOD SUGAR THREE TIMES DAILY 05/01/18   GhPhilemon KingdomMD  oxyCODONE-acetaminophen (PERCOCET/ROXICET) 5-325 MG tablet Take 1-2 tablets by mouth every 8 (eight) hours as needed for severe pain. 09/28/19   Nafziger, CoTommi RumpsNP  pantoprazole (PROTONIX) 40 MG tablet Take 1 tablet (40 mg total) by mouth daily. 01/03/20   Pokhrel, LaCorrie MckusickMD  potassium chloride (KLOR-CON M10) 10 MEQ tablet Take 1 tablet (10 mEq total) by mouth 2 (two) times daily. Patient taking differently: Take 10 mEq by mouth daily.  10/10/19   JaMilus BanisterMD  promethazine (PHENERGAN) 25 MG tablet Take 0.5 tablets (12.5 mg total) by mouth every 6 (six) hours as needed for nausea or vomiting. 05/28/18   Emokpae, Ejiroghene E, MD  timolol (TIMOPTIC) 0.5 % ophthalmic solution 1 drop daily. 10/07/19   [provider]  triamcinolone cream  (KENALOG) 0.1 % Apply 1 application topically 2 (two) times daily. 12/11/19   Nafziger, CoTommi RumpsNP    Allergies    Penicillins, Lisinopril, Tape, Doxycycline, and Latex  Review of Systems   Review of Systems  Constitutional: Negative for chills and fever.  HENT: Negative for congestion  and rhinorrhea.   Eyes: Negative for redness and visual disturbance.  Respiratory: Positive for cough and shortness of breath. Negative for wheezing.   Cardiovascular: Negative for chest pain and palpitations.  Gastrointestinal: Positive for nausea and vomiting.  Genitourinary: Negative for dysuria and urgency.  Musculoskeletal: Negative for arthralgias and myalgias.  Skin: Negative for pallor and wound.  Neurological: Positive for weakness. Negative for dizziness and headaches.    Physical Exam Updated Vital Signs BP (!) 143/75   Pulse 99   Temp 98.4 F (36.9 C) (Oral)   Resp 15   SpO2 96%   Physical Exam Vitals and nursing note reviewed.  Constitutional:      General: She is not in acute distress.    Appearance: She is well-developed. She is not diaphoretic.  HENT:     Head: Normocephalic and atraumatic.  Eyes:     Pupils: Pupils are equal, round, and reactive to light.  Cardiovascular:     Rate and Rhythm: Normal rate and regular rhythm.     Heart sounds: No murmur. No friction rub. No gallop.   Pulmonary:     Effort: Pulmonary effort is normal.     Breath sounds: No wheezing or rales.  Abdominal:     General: There is no distension.     Palpations: Abdomen is soft.     Tenderness: There is no abdominal tenderness. There is no guarding.  Musculoskeletal:        General: No tenderness.     Cervical back: Normal range of motion and neck supple.  Skin:    General: Skin is warm and dry.  Neurological:     Mental Status: She is alert and oriented to person, place, and time.  Psychiatric:        Behavior: Behavior normal.     ED Results / Procedures / Treatments   Labs (all labs  ordered are listed, but only abnormal results are displayed) Labs Reviewed  CBC WITH DIFFERENTIAL/PLATELET - Abnormal; Notable for the following components:      Result Value   WBC 33.8 (*)    RDW 20.6 (*)    nRBC 0.4 (*)    Neutro Abs 29.7 (*)    Monocytes Absolute 1.2 (*)    Basophils Absolute 0.2 (*)    Abs Immature Granulocytes 1.25 (*)    All other components within normal limits  COMPREHENSIVE METABOLIC PANEL - Abnormal; Notable for the following components:   Sodium 133 (*)    Chloride 97 (*)    CO2 20 (*)    Glucose, Bld 166 (*)    Creatinine, Ser 1.05 (*)    Total Protein 6.1 (*)    Albumin 3.3 (*)    Total Bilirubin 2.6 (*)    GFR calc non Af Amer 50 (*)    GFR calc Af Amer 58 (*)    Anion gap 16 (*)    All other components within normal limits  AMMONIA - Abnormal; Notable for the following components:   Ammonia 43 (*)    All other components within normal limits  LACTIC ACID, PLASMA - Abnormal; Notable for the following components:   Lactic Acid, Venous 2.3 (*)    All other components within normal limits  PROTIME-INR - Abnormal; Notable for the following components:   Prothrombin Time 18.2 (*)    INR 1.5 (*)    All other components within normal limits  CBG MONITORING, ED - Abnormal; Notable for the following components:   Glucose-Capillary 140 (*)  All other components within normal limits  SARS CORONAVIRUS 2 (TAT 6-24 HRS)  LIPASE, BLOOD  COMPREHENSIVE METABOLIC PANEL  MAGNESIUM  PHOSPHORUS  CBC WITH DIFFERENTIAL/PLATELET  POC SARS CORONAVIRUS 2 AG -  ED    EKG EKG Interpretation  Date/Time:  Sunday January 13 2020 01:24:18 EDT Ventricular Rate:  100 PR Interval:    QRS Duration: 104 QT Interval:  380 QTC Calculation: 491 R Axis:   -35 Text Interpretation: Sinus tachycardia Atrial premature complex Abnormal R-wave progression, late transition Inferior infarct, old No significant change since last tracing Confirmed by Deno Etienne [phone]) on  01/13/2020 2:31:06 AM   Radiology CT ABDOMEN PELVIS W CONTRAST  Result Date: 01/13/2020 CLINICAL DATA:  Unspecified abdominal pain. Weakness. EXAM: CT ABDOMEN AND PELVIS WITH CONTRAST TECHNIQUE: Multidetector CT imaging of the abdomen and pelvis was performed using the standard protocol following bolus administration of intravenous contrast. CONTRAST:  13m OMNIPAQUE IOHEXOL 300 MG/ML  SOLN COMPARISON:  Hepatic protocol abdominal CT 2 weeks ago 12/30/2019. routine abdominal CT 12/28/2019 FINDINGS: Lower chest: Small left pleural effusion. Mitral annulus calcifications. Central line at the atrial caval junction from a superior approach. There are coronary artery calcifications. Hepatobiliary: Hepatic cirrhosis with nodular contours and diffusely decreased hepatic density. More geographic decreased density in the right lobe corresponds to that seen on prior CT. No new hepatic abnormality. Decreased gallbladder distension from prior exam. Small dependent gallstones again seen. No biliary dilatation. Pancreas: Parenchymal atrophy. No ductal dilatation or inflammation. Spleen: Splenomegaly with spleen spanning 15.9 cm AP. Rounded hypodensity in the superior spleen is unchanged. Additional small low-density lesion inferiorly is unchanged. Adrenals/Urinary Tract: Normal adrenal glands. No hydronephrosis or perinephric edema. Homogeneous renal enhancement. Symmetric renal excretion on delayed phase imaging. Urinary bladder is physiologically distended without wall thickening. Small dependent hyperdensity in the left urinary trigone may represent small bladder stone. Stomach/Bowel: Small hiatal hernia. Stomach is nondistended. No bowel obstruction or bowel inflammation. Appendix not confidently visualized. No evidence of appendicitis. Mild distal colonic diverticulosis without diverticulitis. Vascular/Lymphatic: Nonocclusive thrombus within the main portal vein extending to the portal splenic confluence. No significant  change from prior exam. Intrahepatic portal veins are dilated. Splenic vein is dilated but patent. No mesenteric venous thrombus. Aortic atherosclerosis. No aortic aneurysm. No bulky abdominopelvic lymph nodes. Reproductive: Endometrial thickening again seen. No adnexal mass. Other: Small volume abdominopelvic ascites. No free intra-abdominal air. Small fat containing umbilical hernia. Rounded subcutaneous fluid density structure in the subcutaneous tissues lateral to the right hip may be a sebaceous cyst, unchanged. Musculoskeletal: There are no acute or suspicious osseous abnormalities. Possible avascular necrosis of the left femoral head. IMPRESSION: 1. No acute abnormality in the abdomen/pelvis. 2. Hepatic cirrhosis with portal hypertension, splenomegaly, and small volume abdominopelvic ascites. Unchanged nonocclusive thrombus in the main portal vein. 3. Small left pleural effusion. 4. Small dependent hyperdensity in the left urinary bladder may represent a small bladder stone. 5. Abnormal endometrial thickening again seen for patient of this age. 6. Additional chronic findings as described. Aortic Atherosclerosis (ICD10-I70.0). Electronically Signed   By: MKeith RakeM.D.   On: 01/13/2020 02:42   DG Chest Port 1 View  Result Date: 01/13/2020 CLINICAL DATA:  Shortness of breath and vomiting EXAM: PORTABLE CHEST 1 VIEW COMPARISON:  December 31, 2019 FINDINGS: The heart size and mediastinal contours are unchanged with mild cardiomegaly. Aortic knob calcifications. A right-sided MediPort catheter seen with the tip at the superior cavoatrial junction. Probable subsegmental atelectasis seen at both lung bases. No large  airspace consolidation or pleural effusion. IMPRESSION: Subsegmental atelectasis of both lung bases. Electronically Signed   By: Prudencio Pair M.D.   On: 01/13/2020 01:12    Procedures Procedures (including critical care time)  Medications Ordered in ED Medications  sodium chloride flush  (NS) 0.9 % injection 3 mL (has no administration in time range)  sodium chloride flush (NS) 0.9 % injection 3 mL (has no administration in time range)  0.9 %  sodium chloride infusion (has no administration in time range)  ondansetron (ZOFRAN) tablet 4 mg (has no administration in time range)    Or  ondansetron (ZOFRAN) injection 4 mg (has no administration in time range)  apixaban (ELIQUIS) tablet 5 mg (has no administration in time range)  lactulose (CHRONULAC) 10 GM/15ML solution 20 g (has no administration in time range)  pantoprazole (PROTONIX) EC tablet 40 mg (has no administration in time range)  insulin aspart (novoLOG) injection 0-9 Units (0 Units Subcutaneous Refused 01/13/20 0413)  sodium chloride 0.9 % bolus 1,000 mL (0 mLs Intravenous Stopped 01/13/20 0225)  ondansetron (ZOFRAN) injection 4 mg (4 mg Intravenous Given 01/13/20 0122)  iohexol (OMNIPAQUE) 300 MG/ML solution 100 mL (100 mLs Intravenous Contrast Given 01/13/20 0208)    ED Course  I have reviewed the triage vital signs and the nursing notes.  Pertinent labs & imaging results that were available during my care of the patient were reviewed by me and considered in my medical decision making (see chart for details).    MDM Rules/Calculators/A&P                      79 yo F with a chief complaints of failure to thrive.  Patient having trouble eating and drinking also having a significant cough that is worsened.  Going on for about 6 weeks.  Been hospitalized during that timeframe and has not had significant improvement.  Will obtain a laboratory evaluation chest x-ray CT scan of the abdomen pelvis and reassess.  Patient's white count is mildly elevated from what it was when her last stay in the hospital.  Otherwise there is no significant change in the patient's lab work or CT imaging.  I discussed the results with the patient and family who feel that she is unable to go back home due to profound weakness and inability to eat  or drink.  Will discuss with the hospitalist for possible admission/obs.  The patients results and plan were reviewed and discussed.   Any x-rays performed were independently reviewed by myself.   Differential diagnosis were considered with the presenting HPI.  Medications  sodium chloride flush (NS) 0.9 % injection 3 mL (has no administration in time range)  sodium chloride flush (NS) 0.9 % injection 3 mL (has no administration in time range)  0.9 %  sodium chloride infusion (has no administration in time range)  ondansetron (ZOFRAN) tablet 4 mg (has no administration in time range)    Or  ondansetron (ZOFRAN) injection 4 mg (has no administration in time range)  apixaban (ELIQUIS) tablet 5 mg (has no administration in time range)  lactulose (CHRONULAC) 10 GM/15ML solution 20 g (has no administration in time range)  pantoprazole (PROTONIX) EC tablet 40 mg (has no administration in time range)  insulin aspart (novoLOG) injection 0-9 Units (0 Units Subcutaneous Refused 01/13/20 0413)  sodium chloride 0.9 % bolus 1,000 mL (0 mLs Intravenous Stopped 01/13/20 0225)  ondansetron (ZOFRAN) injection 4 mg (4 mg Intravenous Given 01/13/20 0122)  iohexol (  OMNIPAQUE) 300 MG/ML solution 100 mL (100 mLs Intravenous Contrast Given 01/13/20 0208)    Vitals:   01/13/20 0125 01/13/20 0130 01/13/20 0241 01/13/20 0330  BP:  (!) 148/68 (!) 144/71 (!) 143/75  Pulse:  94 94 99  Resp:  (!) _0 Temp:      TempSrc:      SpO2: 94% 96% 93% 96%    Final diagnoses:  Intractable nausea and vomiting  FTT (failure to thrive) in adult    Admission/ observation were discussed with the admitting physician, patient and/or family and they are comfortable with the plan.   Final Clinical Impression(s) / ED Diagnoses Final diagnoses:  Intractable nausea and vomiting  FTT (failure to thrive) in adult    Rx / DC Orders ED Discharge Orders    None       Deno Etienne, DO 01/13/20 [phone]

## 2020-01-13 NOTE — ED Notes (Signed)
Patient had water per request.  Immediately started having dry heaves.  States same thing happening for 5 weeks and unable to eat during that time

## 2020-01-13 NOTE — Progress Notes (Signed)
Patient still having ongoing nausea and vomiting. Had two more episodes of yellow liquid emesis. MD on call paged.

## 2020-01-13 NOTE — ED Notes (Signed)
Breakfast ordered 

## 2020-01-14 DIAGNOSIS — E440 Moderate protein-calorie malnutrition: Secondary | ICD-10-CM | POA: Diagnosis not present

## 2020-01-14 DIAGNOSIS — Z515 Encounter for palliative care: Secondary | ICD-10-CM | POA: Diagnosis not present

## 2020-01-14 DIAGNOSIS — I129 Hypertensive chronic kidney disease with stage 1 through stage 4 chronic kidney disease, or unspecified chronic kidney disease: Secondary | ICD-10-CM | POA: Diagnosis present

## 2020-01-14 DIAGNOSIS — R627 Adult failure to thrive: Secondary | ICD-10-CM | POA: Diagnosis present

## 2020-01-14 DIAGNOSIS — R195 Other fecal abnormalities: Secondary | ICD-10-CM | POA: Diagnosis not present

## 2020-01-14 DIAGNOSIS — Z7901 Long term (current) use of anticoagulants: Secondary | ICD-10-CM | POA: Diagnosis not present

## 2020-01-14 DIAGNOSIS — Z66 Do not resuscitate: Secondary | ICD-10-CM | POA: Diagnosis not present

## 2020-01-14 DIAGNOSIS — J90 Pleural effusion, not elsewhere classified: Secondary | ICD-10-CM | POA: Diagnosis not present

## 2020-01-14 DIAGNOSIS — K5521 Angiodysplasia of colon with hemorrhage: Secondary | ICD-10-CM | POA: Diagnosis not present

## 2020-01-14 DIAGNOSIS — K7581 Nonalcoholic steatohepatitis (NASH): Secondary | ICD-10-CM | POA: Diagnosis not present

## 2020-01-14 DIAGNOSIS — K7460 Unspecified cirrhosis of liver: Secondary | ICD-10-CM | POA: Diagnosis present

## 2020-01-14 DIAGNOSIS — N390 Urinary tract infection, site not specified: Secondary | ICD-10-CM | POA: Diagnosis not present

## 2020-01-14 DIAGNOSIS — N1831 Chronic kidney disease, stage 3a: Secondary | ICD-10-CM | POA: Diagnosis present

## 2020-01-14 DIAGNOSIS — E876 Hypokalemia: Secondary | ICD-10-CM | POA: Diagnosis present

## 2020-01-14 DIAGNOSIS — B9689 Other specified bacterial agents as the cause of diseases classified elsewhere: Secondary | ICD-10-CM | POA: Diagnosis present

## 2020-01-14 DIAGNOSIS — K31819 Angiodysplasia of stomach and duodenum without bleeding: Secondary | ICD-10-CM | POA: Diagnosis not present

## 2020-01-14 DIAGNOSIS — K31811 Angiodysplasia of stomach and duodenum with bleeding: Secondary | ICD-10-CM | POA: Diagnosis not present

## 2020-01-14 DIAGNOSIS — C946 Myelodysplastic disease, not classified: Secondary | ICD-10-CM | POA: Diagnosis present

## 2020-01-14 DIAGNOSIS — B9620 Unspecified Escherichia coli [E. coli] as the cause of diseases classified elsewhere: Secondary | ICD-10-CM | POA: Diagnosis not present

## 2020-01-14 DIAGNOSIS — I81 Portal vein thrombosis: Secondary | ICD-10-CM | POA: Diagnosis present

## 2020-01-14 DIAGNOSIS — D471 Chronic myeloproliferative disease: Secondary | ICD-10-CM | POA: Diagnosis not present

## 2020-01-14 DIAGNOSIS — I272 Pulmonary hypertension, unspecified: Secondary | ICD-10-CM | POA: Diagnosis present

## 2020-01-14 DIAGNOSIS — K766 Portal hypertension: Secondary | ICD-10-CM | POA: Diagnosis not present

## 2020-01-14 DIAGNOSIS — Z20822 Contact with and (suspected) exposure to covid-19: Secondary | ICD-10-CM | POA: Diagnosis not present

## 2020-01-14 DIAGNOSIS — Z7189 Other specified counseling: Secondary | ICD-10-CM | POA: Diagnosis not present

## 2020-01-14 DIAGNOSIS — E119 Type 2 diabetes mellitus without complications: Secondary | ICD-10-CM | POA: Diagnosis not present

## 2020-01-14 DIAGNOSIS — K3189 Other diseases of stomach and duodenum: Secondary | ICD-10-CM | POA: Diagnosis not present

## 2020-01-14 DIAGNOSIS — E669 Obesity, unspecified: Secondary | ICD-10-CM | POA: Diagnosis present

## 2020-01-14 DIAGNOSIS — K2970 Gastritis, unspecified, without bleeding: Secondary | ICD-10-CM | POA: Diagnosis present

## 2020-01-14 DIAGNOSIS — R710 Precipitous drop in hematocrit: Secondary | ICD-10-CM | POA: Diagnosis not present

## 2020-01-14 DIAGNOSIS — R188 Other ascites: Secondary | ICD-10-CM | POA: Diagnosis present

## 2020-01-14 DIAGNOSIS — R161 Splenomegaly, not elsewhere classified: Secondary | ICD-10-CM | POA: Diagnosis not present

## 2020-01-14 DIAGNOSIS — Z87891 Personal history of nicotine dependence: Secondary | ICD-10-CM | POA: Diagnosis not present

## 2020-01-14 DIAGNOSIS — E1142 Type 2 diabetes mellitus with diabetic polyneuropathy: Secondary | ICD-10-CM | POA: Diagnosis present

## 2020-01-14 DIAGNOSIS — R112 Nausea with vomiting, unspecified: Secondary | ICD-10-CM | POA: Diagnosis present

## 2020-01-14 DIAGNOSIS — Z6836 Body mass index (BMI) 36.0-36.9, adult: Secondary | ICD-10-CM | POA: Diagnosis not present

## 2020-01-14 DIAGNOSIS — E1122 Type 2 diabetes mellitus with diabetic chronic kidney disease: Secondary | ICD-10-CM | POA: Diagnosis present

## 2020-01-14 LAB — GLUCOSE, CAPILLARY
Glucose-Capillary: 112 mg/dL — ABNORMAL HIGH (ref 70–99)
Glucose-Capillary: 119 mg/dL — ABNORMAL HIGH (ref 70–99)
Glucose-Capillary: 134 mg/dL — ABNORMAL HIGH (ref 70–99)
Glucose-Capillary: 136 mg/dL — ABNORMAL HIGH (ref 70–99)
Glucose-Capillary: 137 mg/dL — ABNORMAL HIGH (ref 70–99)
Glucose-Capillary: 97 mg/dL (ref 70–99)

## 2020-01-14 LAB — PROCALCITONIN: Procalcitonin: 0.48 ng/mL

## 2020-01-14 LAB — COMPREHENSIVE METABOLIC PANEL
ALT: 8 U/L (ref 0–44)
AST: 18 U/L (ref 15–41)
Albumin: 2.7 g/dL — ABNORMAL LOW (ref 3.5–5.0)
Alkaline Phosphatase: 78 U/L (ref 38–126)
Anion gap: 10 (ref 5–15)
BUN: 8 mg/dL (ref 8–23)
CO2: 25 mmol/L (ref 22–32)
Calcium: 8.5 mg/dL — ABNORMAL LOW (ref 8.9–10.3)
Chloride: 101 mmol/L (ref 98–111)
Creatinine, Ser: 1.03 mg/dL — ABNORMAL HIGH (ref 0.44–1.00)
GFR calc Af Amer: 59 mL/min — ABNORMAL LOW (ref >60)
GFR calc non Af Amer: 51 mL/min — ABNORMAL LOW (ref >60)
Glucose, Bld: 117 mg/dL — ABNORMAL HIGH (ref 70–99)
Potassium: 3.3 mmol/L — ABNORMAL LOW (ref 3.5–5.1)
Sodium: 136 mmol/L (ref 135–145)
Total Bilirubin: 1.7 mg/dL — ABNORMAL HIGH (ref 0.3–1.2)
Total Protein: 5.1 g/dL — ABNORMAL LOW (ref 6.5–8.1)

## 2020-01-14 LAB — CBC WITH DIFFERENTIAL/PLATELET
Abs Immature Granulocytes: 0.42 10*3/uL — ABNORMAL HIGH (ref 0.00–0.07)
Basophils Absolute: 0.2 10*3/uL — ABNORMAL HIGH (ref 0.0–0.1)
Basophils Relative: 1 %
Eosinophils Absolute: 0 10*3/uL (ref 0.0–0.5)
Eosinophils Relative: 0 %
HCT: 32.2 % — ABNORMAL LOW (ref 36.0–46.0)
Hemoglobin: 10.2 g/dL — ABNORMAL LOW (ref 12.0–15.0)
Immature Granulocytes: 2 %
Lymphocytes Relative: 4 %
Lymphs Abs: 0.8 10*3/uL (ref 0.7–4.0)
MCH: 29.1 pg (ref 26.0–34.0)
MCHC: 31.7 g/dL (ref 30.0–36.0)
MCV: 91.7 fL (ref 80.0–100.0)
Monocytes Absolute: 0.9 10*3/uL (ref 0.1–1.0)
Monocytes Relative: 4 %
Neutro Abs: 20.6 10*3/uL — ABNORMAL HIGH (ref 1.7–7.7)
Neutrophils Relative %: 89 %
Platelets: 177 10*3/uL (ref 150–400)
RBC: 3.51 MIL/uL — ABNORMAL LOW (ref 3.87–5.11)
RDW: 20.2 % — ABNORMAL HIGH (ref 11.5–15.5)
WBC: 22.9 10*3/uL — ABNORMAL HIGH (ref 4.0–10.5)
nRBC: 0.1 % (ref 0.0–0.2)

## 2020-01-14 LAB — URINALYSIS, ROUTINE W REFLEX MICROSCOPIC
Glucose, UA: NEGATIVE mg/dL
Ketones, ur: NEGATIVE mg/dL
Leukocytes,Ua: NEGATIVE
Nitrite: POSITIVE — AB
Protein, ur: NEGATIVE mg/dL
Specific Gravity, Urine: <1.005 — ABNORMAL LOW (ref 1.005–1.030)
pH: 5 (ref 5.0–8.0)

## 2020-01-14 LAB — URINALYSIS, MICROSCOPIC (REFLEX)

## 2020-01-14 LAB — MAGNESIUM: Magnesium: 1.6 mg/dL — ABNORMAL LOW (ref 1.7–2.4)

## 2020-01-14 LAB — PHOSPHORUS: Phosphorus: 2.8 mg/dL (ref 2.5–4.6)

## 2020-01-14 LAB — AMMONIA: Ammonia: 31 umol/L (ref 9–35)

## 2020-01-14 MED ORDER — BOOST / RESOURCE BREEZE PO LIQD CUSTOM
1.0000 | Freq: Three times a day (TID) | ORAL | Status: DC
  Administered 2020-01-14 – 2020-01-15 (×3): 1 via ORAL

## 2020-01-14 MED ORDER — ADULT MULTIVITAMIN W/MINERALS CH
1.0000 | ORAL_TABLET | Freq: Every day | ORAL | Status: DC
  Administered 2020-01-14 – 2020-01-25 (×9): 1 via ORAL
  Filled 2020-01-14 (×12): qty 1

## 2020-01-14 MED ORDER — SODIUM CHLORIDE 0.9 % IV SOLN
1.0000 g | INTRAVENOUS | Status: DC
  Administered 2020-01-14 – 2020-01-15 (×2): 1 g via INTRAVENOUS
  Filled 2020-01-14: qty 1
  Filled 2020-01-14: qty 10
  Filled 2020-01-14: qty 1

## 2020-01-14 MED ORDER — DIPHENHYDRAMINE HCL 12.5 MG/5ML PO ELIX
12.5000 mg | ORAL_SOLUTION | Freq: Four times a day (QID) | ORAL | Status: DC | PRN
  Administered 2020-01-14 – 2020-01-23 (×3): 12.5 mg via ORAL
  Filled 2020-01-14 (×3): qty 10

## 2020-01-14 MED ORDER — POTASSIUM CHLORIDE CRYS ER 20 MEQ PO TBCR
40.0000 meq | EXTENDED_RELEASE_TABLET | Freq: Once | ORAL | Status: AC
  Administered 2020-01-14: 40 meq via ORAL
  Filled 2020-01-14: qty 2

## 2020-01-14 MED ORDER — SODIUM CHLORIDE 0.9 % IV SOLN: INTRAVENOUS | Status: AC

## 2020-01-14 MED ORDER — MAGNESIUM SULFATE 2 GM/50ML IV SOLN
2.0000 g | Freq: Once | INTRAVENOUS | Status: AC
  Administered 2020-01-14: 2 g via INTRAVENOUS
  Filled 2020-01-14: qty 50

## 2020-01-14 NOTE — Plan of Care (Signed)
  Problem: Education: Goal: Knowledge of General Education information will improve Description: Including pain rating scale, medication(s)/side effects and non-pharmacologic comfort measures Outcome: Progressing   Problem: Health Behavior/Discharge Planning: Goal: Ability to manage health-related needs will improve Outcome: Progressing   Problem: Clinical Measurements: Goal: Ability to maintain clinical measurements within normal limits will improve Outcome: Progressing   Problem: Elimination: Goal: Will not experience complications related to urinary retention Outcome: Progressing   Problem: Pain Managment: Goal: General experience of comfort will improve Outcome: Progressing   Problem: Safety: Goal: Ability to remain free from injury will improve Outcome: Progressing   Problem: Skin Integrity: Goal: Risk for impaired skin integrity will decrease Outcome: Progressing

## 2020-01-14 NOTE — TOC Initial Note (Signed)
Transition of Care [facility]) - Initial/Assessment Note    Patient Details  Name: Amanda [name] MRN: [id number] Date of Birth: July 01, 1941  Transition [facility] Care [facility]) CM/SW Contact:    Alexander Mt, LCSW Phone Number: 01/14/2020, 11:26 AM  Clinical Narrative:                 CSW spoke with pt and pt husband [name] at bedside. Introduced self, role, reason for visit. Pt from home w/ spouse. They were at [facility] and describe not having a good experience and discharging home. Pt husband states pt was needing a lot of care at home and per chart review they had a visit w/ their MD who advised that pt come back to the hospital. Pt husband has reached out to [facility], they also are going to drive by and check out [facility]. Pt husband understands restrictions around visitation at this time. TOC team to send referral to [facility] and [facility]. CSW also will request [facility] to reach out to pt husband [name] at [phone].  Expected Discharge Plan: Skilled Nursing Facility Barriers to Discharge: Continued Medical Work up, Ship broker   Patient Goals and CMS Choice Patient states their goals for this hospitalization and ongoing recovery are:: for her to get stronger and back walking CMS Medicare.gov Compare Post Acute Care list provided to:: Patient Represenative (must comment)(pt spouse) Choice offered to / list presented to : Spouse, Patient  Expected Discharge Plan and Services Expected Discharge Plan: [place] In-house Referral: Clinical Social Work Discharge Planning Services: CM Consult Post Acute Care Choice: [place] Living arrangements for the past 2 months: [place]  Prior Living Arrangements/Services Living arrangements for the past 2 months: Single Family Home Lives with:: Spouse Patient language and need for interpreter reviewed:: Yes Do you feel safe going back to the place where you live?: Yes      Need for Family  Participation in Patient Care: Yes (Comment)(assistance with daily cares) Care giver support system in place?: Yes (comment)(spouse/adult children) Current home services: DME Criminal Activity/Legal Involvement Pertinent to Current Situation/Hospitalization: No - Comment as needed  Permission Sought/Granted Permission sought to share information with : Facility Sport and exercise psychologist, Family Supports Permission granted to share information with : Yes, Verbal Permission Granted  Share Information with NAME: Audie Wieser  Permission granted to share info w AGENCY: SNFs ([facility])  Permission granted to share info w Relationship: spouse  Permission granted to share info w Contact Information: [phone]  Emotional Assessment Appearance:: Appears stated age Attitude/Demeanor/Rapport: Lethargic Affect (typically observed): Restless, Overwhelmed Orientation: : Oriented to Self, Oriented to Place, Oriented to  Time, Oriented to Situation Alcohol / Substance Use: Not Applicable Psych Involvement: (n/a)  Admission diagnosis:  Nausea & vomiting [R11.2] FTT (failure to thrive) in adult [R62.7] Intractable nausea and vomiting [R11.2] Patient Active Problem List   Diagnosis Date Noted  . Nausea & vomiting 01/13/2020  . Failure to thrive in adult 01/13/2020  . Myeloproliferative disorder ([place]) 01/13/2020  . Portal vein thrombosis   . SIRS (systemic inflammatory response syndrome) ([place]) 12/28/2019  . Worsening lower extremity weakness 12/14/2019  . Leg swelling 08/21/2019  . Hypotension due to hypovolemia 08/10/2019  . CKD (chronic kidney disease), stage III 08/10/2019  . Acute blood loss anemia 05/23/2018  . Dehydration 03/22/2018  . History of CVA (cerebrovascular accident) 03/22/2018  . Hip injury, left, subsequent encounter 11/08/2017  . Iron deficiency anemia 12/22/2016  . OSA (obstructive sleep apnea) 04/21/2016  . Osteopenia  04/07/2016  . Lung nodule, solitary 02/05/2016   . Aortic dilatation ([place]) 02/05/2016  . Dyspnea 01/28/2016  . Cirrhosis ([place]) 01/28/2016  . Allergic rhinitis 01/23/2016  . Hepatic cirrhosis ([place]) 10/28/2015  . Irritable bowel syndrome 08/12/2015  . Obesity (BMI 30-39.9) 04/30/2015  . Diabetic retinopathy of both eyes ([place]) 04/18/2015  . Former smoker 04/18/2015  . GAD (generalized anxiety disorder) 04/18/2015  . Status post primary angioplasty with coronary stent 04/18/2015  . Coronary artery disease involving native coronary artery 03/01/2015  . Chronic diastolic CHF (congestive heart failure) ([place]) 02/01/2015  . ST elevation myocardial infarction (STEMI) involving left circumflex coronary artery in recovery phase ([place]) 01/27/2015  . Idioventricular rhythm ([place])   . Diabetic peripheral neuropathy associated with type 2 diabetes mellitus ([place]) 01/23/2015  . Type 2 diabetes mellitus with diabetic polyneuropathy ([place]) 01/23/2015  . Thrombocytosis ([place]) 07/04/2014  . Cholelithiasis 02/07/2014  . Chronic diarrhea 01/29/2014  . Leukocytosis 06/02/2013  . [name] ([place]) 05/31/2013  . Essential hypertension 05/31/2013  . History of Bell's palsy 05/26/2013  . DERMATITIS, ATOPIC 11/16/2010  . DIZZINESS 11/16/2010  . DIASTOLIC DYSFUNCTION 77/93/9030  . Hyperlipidemia 12/15/2009  . Essential hypertension, benign 11/24/2009  . PSORIASIS 11/24/2009  . Depression 09/13/2009  . 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 [place], [place] - [address] [place] [address] Phone: [phone] Fax: [phone]  [place], [place] - [address] [place] [address] Phone: [phone] Fax: [phone]  Readmission Risk Interventions No flowsheet data found.

## 2020-01-14 NOTE — Progress Notes (Signed)
PROGRESS NOTE    KIMAYA WHITLATCH  BPZ:[id number] DOB: 1941-01-19 DOA: 01/13/2020 PCP: [name], NP   Brief Narrative:   Amanda [name] is Emmajean Ratledge 79 y.o. 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  Since recent hospitalization, the patient has remained in bed most of the time, not eating or drinking much, and not taking her medications. Symptoms wax and wane, she is able to tolerate small bites and sips sometimes. She denies fever or chills. SNF had been recommended by PT and physicians prior to the recent discharge but patient wanted to go home and her husband thought he would be able to care for her there. [facility]. She had Laszlo Ellerby visit with her PCP 2 days ago and it was advised that she return to the hospital in order to be [facility].   ED Course: Upon arrival to the ED, patient is found to be afebrile, saturating 90s on room air, and with stable blood pressure.  EKG features sinus rhythm with rate 100 and PAC.  Chemistry panel with chronic mild renal insufficiency and bilirubin of 2.6.  CBC with stable chronic leukocytosis.  Covid antigen is negative.  CT of the abdomen and pelvis is negative for acute intra-abdominal or pelvic abnormalities.  Patient was given Lorry Furber liter of saline and Zofran in the ED.  Assessment & Plan:   Principal Problem:   Failure to thrive in adult Active Problems:   Cirrhosis ([place])   Type 2 diabetes mellitus with diabetic polyneuropathy (HCC)   CKD (chronic kidney disease), stage III   Portal vein thrombosis   Nausea & vomiting   Myeloproliferative disorder ([place])  1. Failure to thrive  - Presents from home where she is essentially  bed-bound and with ongoing nausea and loss of appetite, hoping to be placed in SNF  - CT abdomen with acute abnormality - cirrhosis with portal HTN, splenomegaly, small volume ascites, nonocclusive portal vein thrombus.  Small L pleural effusion. Hyperdensity in L urinary bladder, possible small bladder stone.  Abnormal endometrial thickening.   - CT chest without acute pulm process - pulm hypertension - findings of cirrhosis and sequela of portal HTN - UA with positive nitrite, rare bacteria -> follow cx -> will start abx while waiting for cx results - Nausea improved spontaneously in ED and she was able to take some oral medications  - Consult palliative, continue supportive care    2. Decompensated cirrhosis  - Total bili up from recent hospitalization, MELD not accurate d/t affect of Eliquis on INR, overall appears stable, not encephalopathic   - Continue PPI and lactulose, hold diuretic given recent loss of appetite and N/V   3. Type II DM  - A1c was only 5.1% earlier this month   - Continue CBG checks and low-intensity SSI   4. CKD IIIa  - SCr is 1.05, looks to be close to baseline    5. Myeloproliferative disorder  - chronic leukocytosis, worse today - PCT .22, not clearly suggestive of infection - follow UA, additional imaging - jakafi currently on hold - Will c/s Dr. [name] on Monday -> he'll see her 3/30, appreciate assistance  6. Portal vein thrombosis  - Continue Eliquis   #  CAD: continue eliquis  # Hypertension: lasix and amlodipine on hold  # Glaucoma: continue eye drops  # Hypokalemia  Hypomag: replace and follow  DVT prophylaxis: eliquis Code Status: DNR Family Communication: husband over phone Disposition Plan:  . Patient came from: home            . Anticipated d/c place: SNF . Barriers to d/c OR conditions which need to be met to effect Zenaida Tesar safe d/c: pending improvement in PO intake   Consultants:   none  Procedures:    none  Antimicrobials Anti-infectives (From admission, onward)   None         Subjective: Still no appetite No new complaints.  Otherwise, actually feeling Appolonia Ackert bit better.  Objective: Vitals:   01/13/20 2010 01/14/20 0026 01/14/20 0300 01/14/20 1250  BP: (!) 132/54 (!) 136/57 (!) 131/56 (!) 125/55  Pulse: 78 80 78 79  Resp: 18 17 18 16   Temp: 98.4 F (36.9 C) 98.1 F (36.7 C) 98.4 F (36.9 C) 98 F (36.7 C)  TempSrc: Oral Oral Oral Oral  SpO2: 98% 97% 98% 97%    Intake/Output Summary (Last 24 hours) at 01/14/2020 1644 Last data filed at 01/14/2020 1312 Gross per 24 hour  Intake 603 ml  Output --  Net 603 ml   There were no vitals filed for this visit.  Examination:  General: No acute distress. Cardiovascular: Heart sounds show Meliss Fleek regular rate, and rhythm Lungs: Clear to auscultation bilaterally Abdomen: Soft, nontender, nondistended Neurological: Alert and oriented 3. Moves all extremities 4. Cranial nerves II through XII grossly intact. Skin: Warm and dry. No rashes or lesions. Extremities: No clubbing or cyanosis. No edema.   Data Reviewed: I have personally reviewed following labs and imaging studies  CBC: Recent Labs  Lab 01/13/20 0055 01/13/20 0407 01/14/20 0242  WBC 33.8* 30.3* 22.9*  NEUTROABS 29.7* 29.1* 20.6*  HGB 13.0 11.8* 10.2*  HCT 41.3 37.7 32.2*  MCV 92.2 93.3 91.7  PLT 316 246 767   Basic Metabolic Panel: Recent Labs  Lab 01/13/20 0055 01/13/20 0355 01/14/20 0242  NA 133* 135 136  K 4.1 3.8 3.3*  CL 97* 99 101  CO2 20* 22 25  GLUCOSE 166* 132* 117*  BUN 8 9 8   CREATININE 1.05* 1.00 1.03*  CALCIUM 9.1 8.8* 8.5*  MG  --  1.6* 1.6*  PHOS  --  3.2 2.8   GFR: CrCl cannot be calculated (Unknown ideal weight.). Liver Function Tests: Recent Labs  Lab 01/13/20 0055 01/13/20 0355 01/14/20 0242  AST 31 32 18  ALT 11 11 8   ALKPHOS 95 87 78  BILITOT 2.6* 2.5* 1.7*  PROT 6.1* 5.9* 5.1*  ALBUMIN 3.3* 3.1* 2.7*   Recent  Labs  Lab 01/13/20 0055  LIPASE 27   Recent Labs  Lab 01/13/20 0055 01/14/20 0830  AMMONIA 43* 31   Coagulation Profile: Recent Labs  Lab 01/13/20 0055  INR 1.5*   Cardiac Enzymes: No results for input(s): CKTOTAL, CKMB, CKMBINDEX, TROPONINI in the last 168 hours. BNP (last 3 results) No results for input(s): PROBNP in the last 8760 hours. HbA1C: No results for input(s): HGBA1C in the last 72 hours. CBG: Recent Labs  Lab 01/13/20 2358 01/14/20 0304 01/14/20 0803 01/14/20 1253 01/14/20 1602  GLUCAP 169* 112* 97 136* 137*   Lipid Profile: No results for input(s): CHOL, HDL, LDLCALC, TRIG, CHOLHDL, LDLDIRECT in the last 72 hours. Thyroid Function Tests: No results for input(s): TSH, T4TOTAL, FREET4, T3FREE, THYROIDAB in  the last 72 hours. Anemia Panel: No results for input(s): VITAMINB12, FOLATE, FERRITIN, TIBC, IRON, RETICCTPCT in the last 72 hours. Sepsis Labs: Recent Labs  Lab 01/13/20 0055 01/13/20 0355 01/14/20 0242  PROCALCITON  --  0.22 0.48  LATICACIDVEN 2.3*  --   --     Recent Results (from the past 240 hour(s))  SARS CORONAVIRUS 2 (TAT 6-24 HRS) Nasopharyngeal Nasopharyngeal Swab     Status: None   Collection Time: 01/13/20  4:00 AM   Specimen: Nasopharyngeal Swab  Result Value Ref Range Status   SARS Coronavirus 2 NEGATIVE NEGATIVE Final    Comment: (NOTE) SARS-CoV-2 target nucleic acids are NOT DETECTED. The SARS-CoV-2 RNA is generally detectable in upper and lower respiratory specimens during the acute phase of infection. Negative results do not preclude SARS-CoV-2 infection, do not rule out co-infections with other pathogens, and should not be used as the sole basis for treatment or other patient management decisions. Negative results must be combined with clinical observations, patient history, and epidemiological information. The expected result is Negative. Fact Sheet for Patients: SugarRoll.be Fact Sheet  for Healthcare Providers: https://www.woods-mathews.com/ This test is not yet approved or cleared by the [place] FDA and  has been authorized for detection and/or diagnosis of SARS-CoV-2 by FDA under an Emergency Use Authorization (EUA). This EUA will remain  in effect (meaning this test can be used) for the duration of the COVID-19 declaration under Section 56 4(b)(1) of the Act, 21 U.S.C. section 360bbb-3(b)(1), unless the authorization is terminated or revoked sooner. Performed at [facility], [place] [address], [place], [place] [address]          Radiology Studies: CT CHEST W CONTRAST  Result Date: 01/13/2020 CLINICAL DATA:  Concern for pleural effusion. EXAM: CT CHEST WITH CONTRAST TECHNIQUE: Multidetector CT imaging of the chest was performed during intravenous contrast administration. CONTRAST:  74m OMNIPAQUE IOHEXOL 300 MG/ML  SOLN COMPARISON:  Chest radiograph performed the same day and CT chest dated 03/23/2018. FINDINGS: Cardiovascular: Eun Vermeer right internal jugular central venous port catheter tip terminates at the superior cavoatrial junction. The main pulmonary artery is enlarged, measuring 3.6 cm in diameter. Vascular calcifications are seen in the coronary arteries and aortic arch. Normal heart size. No pericardial effusion. Mediastinum/Nodes: No enlarged mediastinal, hilar, or axillary lymph nodes. Thyroid gland, trachea, and esophagus demonstrate no significant findings. Lungs/Pleura: Reida Hem 4 mm pulmonary nodule in the right lower lobe is not significantly changed for greater than 2 years and benign. There is mild bilateral dependent atelectasis. There is no focal consolidation, pleural effusion, or pneumothorax. Upper Abdomen: There is hepatic cirrhosis with small volume intraperitoneal fluid seen near the spleen. Nonocclusive thrombus in the main portal vein is not significantly changed. Musculoskeletal: No chest wall abnormality. No acute or significant osseous  findings. IMPRESSION: 1. No acute pulmonary process. 2. Enlarged main pulmonary artery, suggestive of pulmonary hypertension. 3. Hepatic cirrhosis and sequela of portal hypertension as well as unchanged nonocclusive thrombus in the main portal vein. Aortic Atherosclerosis (ICD10-I70.0). Electronically Signed   By: TZerita BoersM.D.   On: 01/13/2020 18:47   CT ABDOMEN PELVIS W CONTRAST  Result Date: 01/13/2020 CLINICAL DATA:  Unspecified abdominal pain. Weakness. EXAM: CT ABDOMEN AND PELVIS WITH CONTRAST TECHNIQUE: Multidetector CT imaging of the abdomen and pelvis was performed using the standard protocol following bolus administration of intravenous contrast. CONTRAST:  1091mOMNIPAQUE IOHEXOL 300 MG/ML  SOLN COMPARISON:  Hepatic protocol abdominal CT 2 weeks ago 12/30/2019. routine abdominal CT  12/28/2019 FINDINGS: Lower chest: Small left pleural effusion. Mitral annulus calcifications. Central line at the atrial caval junction from Everardo Voris superior approach. There are coronary artery calcifications. Hepatobiliary: Hepatic cirrhosis with nodular contours and diffusely decreased hepatic density. More geographic decreased density in the right lobe corresponds to that seen on prior CT. No new hepatic abnormality. Decreased gallbladder distension from prior exam. Small dependent gallstones again seen. No biliary dilatation. Pancreas: Parenchymal atrophy. No ductal dilatation or inflammation. Spleen: Splenomegaly with spleen spanning 15.9 cm AP. Rounded hypodensity in the superior spleen is unchanged. Additional small low-density lesion inferiorly is unchanged. Adrenals/Urinary Tract: Normal adrenal glands. No hydronephrosis or perinephric edema. Homogeneous renal enhancement. Symmetric renal excretion on delayed phase imaging. Urinary bladder is physiologically distended without wall thickening. Small dependent hyperdensity in the left urinary trigone may represent small bladder stone. Stomach/Bowel: Small hiatal  hernia. Stomach is nondistended. No bowel obstruction or bowel inflammation. Appendix not confidently visualized. No evidence of appendicitis. Mild distal colonic diverticulosis without diverticulitis. Vascular/Lymphatic: Nonocclusive thrombus within the main portal vein extending to the portal splenic confluence. No significant change from prior exam. Intrahepatic portal veins are dilated. Splenic vein is dilated but patent. No mesenteric venous thrombus. Aortic atherosclerosis. No aortic aneurysm. No bulky abdominopelvic lymph nodes. Reproductive: Endometrial thickening again seen. No adnexal mass. Other: Small volume abdominopelvic ascites. No free intra-abdominal air. Small fat containing umbilical hernia. Rounded subcutaneous fluid density structure in the subcutaneous tissues lateral to the right hip may be Jurnie Garritano sebaceous cyst, unchanged. Musculoskeletal: There are no acute or suspicious osseous abnormalities. Possible avascular necrosis of the left femoral head. IMPRESSION: 1. No acute abnormality in the abdomen/pelvis. 2. Hepatic cirrhosis with portal hypertension, splenomegaly, and small volume abdominopelvic ascites. Unchanged nonocclusive thrombus in the main portal vein. 3. Small left pleural effusion. 4. Small dependent hyperdensity in the left urinary bladder may represent Jocie Meroney small bladder stone. 5. Abnormal endometrial thickening again seen for patient of this age. 6. Additional chronic findings as described. Aortic Atherosclerosis (ICD10-I70.0). Electronically Signed   By: Keith Rake M.D.   On: 01/13/2020 02:42   DG Chest Port 1 View  Result Date: 01/13/2020 CLINICAL DATA:  Shortness of breath and vomiting EXAM: PORTABLE CHEST 1 VIEW COMPARISON:  December 31, 2019 FINDINGS: The heart size and mediastinal contours are unchanged with mild cardiomegaly. Aortic knob calcifications. Jewett Mcgann right-sided MediPort catheter seen with the tip at the superior cavoatrial junction. Probable subsegmental atelectasis  seen at both lung bases. No large airspace consolidation or pleural effusion. IMPRESSION: Subsegmental atelectasis of both lung bases. Electronically Signed   By: Prudencio Pair M.D.   On: 01/13/2020 01:12        Scheduled Meds: . apixaban  5 mg Oral BID  . feeding supplement  1 Container Oral TID BM  . insulin aspart  0-9 Units Subcutaneous Q4H  . lactulose  20 g Oral BID  . latanoprost  1 drop Both Eyes QHS  . mirtazapine  15 mg Oral QHS  . multivitamin with minerals  1 tablet Oral Daily  . pantoprazole  40 mg Oral Daily  . sodium chloride flush  3 mL Intravenous Q12H  . timolol  1 drop Both Eyes Daily   Continuous Infusions: . sodium chloride       LOS: 0 days    Time spent: over 30 min    Fayrene Helper, MD [facility]   To contact the attending provider between 7A-7P or the covering provider during after hours 7P-7A, please log into  the web site [email or web address] and access using universal [place] password for that web site. If you do not have the password, please call the hospital operator.  01/14/2020, 4:44 PM

## 2020-01-14 NOTE — NC FL2 (Signed)
[place] LEVEL OF CARE SCREENING TOOL     IDENTIFICATION  Patient Name: Amanda [name] Birthdate: 10-30-1940 Sex: female Admission Date (Current Location): 01/13/2020  [facility] and [place] Number:  Herbalist and Address:  [facility], [place] [address], [place], [place] [address]      Provider Number: [id number]  Attending Physician Name and Address:  Elodia [place]., *  Relative Name and Phone Number:       Current Level of Care: Hospital Recommended Level of Care: [place] Prior Approval Number:    Date Approved/Denied:   PASRR Number: [id number]  Discharge Plan: SNF    Current Diagnoses: Patient Active Problem List   Diagnosis Date Noted  . Nausea & vomiting 01/13/2020  . Failure to thrive in adult 01/13/2020  . Myeloproliferative disorder ([place]) 01/13/2020  . Portal vein thrombosis   . SIRS (systemic inflammatory response syndrome) ([place]) 12/28/2019  . Worsening lower extremity weakness 12/14/2019  . Leg swelling 08/21/2019  . Hypotension due to hypovolemia 08/10/2019  . CKD (chronic kidney disease), stage III 08/10/2019  . Acute blood loss anemia 05/23/2018  . Dehydration 03/22/2018  . History of CVA (cerebrovascular accident) 03/22/2018  . Hip injury, left, subsequent encounter 11/08/2017  . Iron deficiency anemia 12/22/2016  . OSA (obstructive sleep apnea) 04/21/2016  . Osteopenia 04/07/2016  . Lung nodule, solitary 02/05/2016  . Aortic dilatation ([place]) 02/05/2016  . Dyspnea 01/28/2016  . Cirrhosis ([place]) 01/28/2016  . Allergic rhinitis 01/23/2016  . Hepatic cirrhosis ([place]) 10/28/2015  . Irritable bowel syndrome 08/12/2015  . Obesity (BMI 30-39.9) 04/30/2015  . Diabetic retinopathy of both eyes ([place]) 04/18/2015  . Former smoker 04/18/2015  . GAD (generalized anxiety disorder) 04/18/2015  . Status post primary angioplasty with coronary stent 04/18/2015  . Coronary artery disease  involving native coronary artery 03/01/2015  . Chronic diastolic CHF (congestive heart failure) ([place]) 02/01/2015  . ST elevation myocardial infarction (STEMI) involving left circumflex coronary artery in recovery phase ([place]) 01/27/2015  . Idioventricular rhythm ([place])   . Diabetic peripheral neuropathy associated with type 2 diabetes mellitus ([place]) 01/23/2015  . Type 2 diabetes mellitus with diabetic polyneuropathy ([place]) 01/23/2015  . Thrombocytosis ([place]) 07/04/2014  . Cholelithiasis 02/07/2014  . Chronic diarrhea 01/29/2014  . Leukocytosis 06/02/2013  . [name] ([place]) 05/31/2013  . Essential hypertension 05/31/2013  . History of Bell's palsy 05/26/2013  . DERMATITIS, ATOPIC 11/16/2010  . DIZZINESS 11/16/2010  . DIASTOLIC DYSFUNCTION 56/21/3086  . Hyperlipidemia 12/15/2009  . Essential hypertension, benign 11/24/2009  . PSORIASIS 11/24/2009  . Depression 09/13/2009  . 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[place]) 07/08/2008    Orientation RESPIRATION BLADDER Height & Weight     Self, Time, Situation, Place  O2(nasal canula) Incontinent Weight:   Height:     BEHAVIORAL SYMPTOMS/MOOD NEUROLOGICAL BOWEL NUTRITION STATUS      Incontinent Diet(see discharge summary)  AMBULATORY STATUS COMMUNICATION OF NEEDS Skin   Extensive Assist Verbally Skin abrasions, Other (Comment)(MASD on perineum, skin tear on buttocks; rash on breasts and groin; abrasions on arms)                       Personal Care Assistance Level of Assistance  Bathing, Feeding, Dressing Bathing Assistance: Maximum assistance Feeding assistance: Independent Dressing Assistance: Maximum assistance     Functional Limitations Info  Sight, Hearing, Speech Sight Info: Adequate Hearing Info: Adequate Speech Info: Adequate    SPECIAL CARE  FACTORS FREQUENCY  OT (By licensed OT), PT (By licensed PT)     PT Frequency: 5x week OT Frequency: 5x week            Contractures Contractures Info:  Not present    Additional Factors Info  Code Status, Allergies, Insulin Sliding Scale, Psychotropic Code Status Info: DNR Allergies Info: Penicillins, Lisinopril, Tape, Doxycycline, Latex Psychotropic Info: mirtazapine (REMERON) tablet 15 mg daily at bedtime Insulin Sliding Scale Info: insulin aspart (novoLOG) injection 0-9 Units every 4 hours       Current Medications (01/14/2020):  This is the current hospital active medication list Current Facility-Administered Medications  Medication Dose Route Frequency Provider Last Rate Last Admin  . 0.9 %  sodium chloride infusion  250 mL Intravenous PRN Opyd, Ilene Qua, MD      . apixaban (ELIQUIS) tablet 5 mg  5 mg Oral BID Opyd, Ilene Qua, MD   5 mg at 01/14/20 0951  . insulin aspart (novoLOG) injection 0-9 Units  0-9 Units Subcutaneous Q4H Opyd, Ilene Qua, MD   1 Units at 01/13/20 2058  . lactulose (CHRONULAC) 10 GM/15ML solution 20 g  20 g Oral BID Opyd, Ilene Qua, MD   20 g at 01/13/20 2057  . latanoprost (XALATAN) 0.005 % ophthalmic solution 1 drop  1 drop Both Eyes QHS Elodia [place]., MD   1 drop at 01/13/20 2059  . mirtazapine (REMERON) tablet 15 mg  15 mg Oral QHS Elodia [place]., MD   15 mg at 01/13/20 2057  . ondansetron (ZOFRAN) tablet 4 mg  4 mg Oral Q6H PRN Opyd, Ilene Qua, MD       Or  . ondansetron (ZOFRAN) injection 4 mg  4 mg Intravenous Q6H PRN Opyd, Ilene Qua, MD   4 mg at 01/14/20 1023  . pantoprazole (PROTONIX) EC tablet 40 mg  40 mg Oral Daily Opyd, Ilene Qua, MD   40 mg at 01/14/20 0950  . sodium chloride flush (NS) 0.9 % injection 3 mL  3 mL Intravenous Q12H Opyd, Ilene Qua, MD   3 mL at 01/14/20 0953  . sodium chloride flush (NS) 0.9 % injection 3 mL  3 mL Intravenous PRN Opyd, Ilene Qua, MD      . timolol (TIMOPTIC) 0.5 % ophthalmic solution 1 drop  1 drop Both Eyes Daily Elodia [place]., MD   1 drop at 01/14/20 3818     Discharge Medications: Please see discharge summary for a list of discharge  medications.  Relevant Imaging Results:  Relevant Lab Results:   Additional Information SSN: [id number]  [place], [place]

## 2020-01-14 NOTE — Progress Notes (Signed)
Initial Nutrition Assessment  DOCUMENTATION CODES:   Non-severe (moderate) malnutrition in context of chronic illness  INTERVENTION:    Boost Breeze po TID, each supplement provides 250 kcal and 9 grams of protein  MVI daily   NUTRITION DIAGNOSIS:   Moderate Malnutrition related to chronic illness(cirrhosis/colitis) as evidenced by moderate muscle depletion, mild muscle depletion, energy intake < 75% for > or equal to 1 month.  GOAL:   Patient will meet greater than or equal to 90% of their needs  MONITOR:   PO intake, Supplement acceptance, Weight trends, Labs, I & O's, Diet advancement  REASON FOR ASSESSMENT:   Consult Assessment of nutrition requirement/status  ASSESSMENT:   Patient with PMH significant for CAD, DM, myeloproliferative disorder, decompensated liver cirrhosis, CKD III, portal vein vein thrombosis, and recent admission for colitis. Presents this admission with failure to thrive.  Obtained history from husband. Reports pt's appetite decreased over the last six weeks due to lethargy/nausea. States during this time she consumed soups and other beverages. Does not use supplementation at home. Tolerating sips of juice this admission. Discussed the importance of protein intake for preservation of lean body mass. Amendable to supplementation.   Reports a UBW of 230 lb and is unsure if pt lost weight PTA. Records indicate pt weighed 102.6 kg on 08/13/19 and 96.6 kg on 3/12. Need to obtain recent admission weight to assess for weight loss.   Medications: SS novolog, lactulose, remeron Labs: K 3.3 (L) Mg 1.6 (L) CBG 97-169  NUTRITION - FOCUSED PHYSICAL EXAM:    Most Recent Value  Orbital Region  No depletion  Upper Arm Region  Mild depletion  Thoracic and Lumbar Region  Unable to assess  Buccal Region  No depletion  Temple Region  Moderate depletion  Clavicle Bone Region  Mild depletion  Clavicle and Acromion Bone Region  Mild depletion  Scapular Bone Region   Unable to assess  Dorsal Hand  Mild depletion  Patellar Region  Moderate depletion  Anterior Thigh Region  Moderate depletion  Posterior Calf Region  Moderate depletion  Edema (RD Assessment)  Mild  Hair  Reviewed  Eyes  Reviewed  Mouth  Reviewed  Skin  Reviewed  Nails  Reviewed     Diet Order:   Diet Order            Diet clear liquid Room service appropriate? Yes; Fluid consistency: Thin  Diet effective now              EDUCATION NEEDS:   Education needs have been addressed  Skin:  Skin Assessment: Skin Integrity Issues: Skin Integrity Issues:: Other (Comment) Other: perineum  Last BM:  PTA  Height:   Ht Readings from Last 1 Encounters:  12/28/19 5' 4.5" (1.638 m)    Weight:   Wt Readings from Last 1 Encounters:  12/28/19 96.6 kg    BMI:  There is no height or weight on file to calculate BMI.  Estimated Nutritional Needs:   Kcal:  2000-2200 kcal  Protein:  100-120 grams  Fluid:  >/= 2 L/day   Mariana Single RD, LDN Clinical Nutrition Pager listed in [place]

## 2020-01-15 ENCOUNTER — Other Ambulatory Visit: Payer: Self-pay

## 2020-01-15 DIAGNOSIS — Z515 Encounter for palliative care: Secondary | ICD-10-CM

## 2020-01-15 DIAGNOSIS — I81 Portal vein thrombosis: Secondary | ICD-10-CM

## 2020-01-15 DIAGNOSIS — D471 Chronic myeloproliferative disease: Secondary | ICD-10-CM

## 2020-01-15 DIAGNOSIS — E119 Type 2 diabetes mellitus without complications: Secondary | ICD-10-CM

## 2020-01-15 DIAGNOSIS — R627 Adult failure to thrive: Secondary | ICD-10-CM

## 2020-01-15 DIAGNOSIS — Z7189 Other specified counseling: Secondary | ICD-10-CM

## 2020-01-15 DIAGNOSIS — Z7901 Long term (current) use of anticoagulants: Secondary | ICD-10-CM

## 2020-01-15 DIAGNOSIS — Z87891 Personal history of nicotine dependence: Secondary | ICD-10-CM

## 2020-01-15 DIAGNOSIS — K7581 Nonalcoholic steatohepatitis (NASH): Secondary | ICD-10-CM

## 2020-01-15 DIAGNOSIS — R161 Splenomegaly, not elsewhere classified: Secondary | ICD-10-CM

## 2020-01-15 LAB — CBC
HCT: 33.5 % — ABNORMAL LOW (ref 36.0–46.0)
Hemoglobin: 10.7 g/dL — ABNORMAL LOW (ref 12.0–15.0)
MCH: 29.2 pg (ref 26.0–34.0)
MCHC: 31.9 g/dL (ref 30.0–36.0)
MCV: 91.5 fL (ref 80.0–100.0)
Platelets: 172 10*3/uL (ref 150–400)
RBC: 3.66 MIL/uL — ABNORMAL LOW (ref 3.87–5.11)
RDW: 19.9 % — ABNORMAL HIGH (ref 11.5–15.5)
WBC: 21.7 10*3/uL — ABNORMAL HIGH (ref 4.0–10.5)
nRBC: 0.2 % (ref 0.0–0.2)

## 2020-01-15 LAB — BASIC METABOLIC PANEL
Anion gap: 12 (ref 5–15)
BUN: 10 mg/dL (ref 8–23)
CO2: 21 mmol/L — ABNORMAL LOW (ref 22–32)
Calcium: 8.3 mg/dL — ABNORMAL LOW (ref 8.9–10.3)
Chloride: 99 mmol/L (ref 98–111)
Creatinine, Ser: 1.06 mg/dL — ABNORMAL HIGH (ref 0.44–1.00)
GFR calc Af Amer: 57 mL/min — ABNORMAL LOW (ref >60)
GFR calc non Af Amer: 50 mL/min — ABNORMAL LOW (ref >60)
Glucose, Bld: 148 mg/dL — ABNORMAL HIGH (ref 70–99)
Potassium: 3.6 mmol/L (ref 3.5–5.1)
Sodium: 132 mmol/L — ABNORMAL LOW (ref 135–145)

## 2020-01-15 LAB — GLUCOSE, CAPILLARY
Glucose-Capillary: 99 mg/dL (ref 70–99)
Glucose-Capillary: 119 mg/dL — ABNORMAL HIGH (ref 70–99)
Glucose-Capillary: 139 mg/dL — ABNORMAL HIGH (ref 70–99)
Glucose-Capillary: 157 mg/dL — ABNORMAL HIGH (ref 70–99)
Glucose-Capillary: 141 mg/dL — ABNORMAL HIGH (ref 70–99)
Glucose-Capillary: 108 mg/dL — ABNORMAL HIGH (ref 70–99)

## 2020-01-15 LAB — PROCALCITONIN: Procalcitonin: 0.51 ng/mL

## 2020-01-15 MED ORDER — DRONABINOL 2.5 MG PO CAPS
2.5000 mg | ORAL_CAPSULE | Freq: Two times a day (BID) | ORAL | Status: DC
  Administered 2020-01-15 – 2020-01-25 (×21): 2.5 mg via ORAL
  Filled 2020-01-15 (×21): qty 1

## 2020-01-15 NOTE — Progress Notes (Signed)
PROGRESS NOTE    Amanda [name]  JOA:[id number] DOB: [birth year] DOA: 01/13/2020 PCP: Amanda Peng, NP   Brief Narrative:   Amanda [name] is Amanda [name] 79 y.o. female with medical history significant for coronary artery disease, type 2 diabetes mellitus, myeloproliferative disorder, decompensated liver cirrhosis, chronic kidney disease stage IIIa, portal vein thrombosis on Amanda [name], and recent admission for colitis, now returning to the ED due to ongoing nausea, vomiting, and not eating or drinking.  Since recent hospitalization, the patient has remained in bed most of the time, not eating or drinking much, and not taking her medications. Symptoms wax and wane, she is able to tolerate small bites and sips sometimes. She denies fever or chills. SNF had been recommended by PT and physicians prior to the recent discharge but patient wanted to go home and her husband thought he would be able to care for her there. She now wants to go [name] nursing home. She had Amanda [name] visit with her PCP 2 days ago and it was advised that she return to the hospital in order to be placed in [name] nursing facility.   ED Course: Upon arrival to the ED, patient is found to be afebrile, saturating 90s on room air, and with stable blood pressure.  EKG features sinus rhythm with rate 100 and PAC.  Chemistry panel with chronic mild renal insufficiency and bilirubin of 2.6.  CBC with stable chronic leukocytosis.  Covid antigen is negative.  CT of the abdomen and pelvis is negative for acute intra-abdominal or pelvic abnormalities.  Patient was given Amanda [name] liter of saline and Zofran in the ED.  She was admitted with failure to thrive.  She "stopped eating" about 4.5 weeks ago and has been essentially bed bound.  Husband unable to care for her at home.  Will need placement as well as further w/u, supportive care for her decreased appetite.  Heme, palliative care following.  Assessment & Plan:   Principal Problem:   Failure to thrive in adult Active  Problems:   Cirrhosis ([place])   Type 2 diabetes mellitus with diabetic polyneuropathy (HCC)   CKD (chronic kidney disease), stage III   Portal vein thrombosis   Nausea & vomiting   Myeloproliferative disorder ([place])  1. Failure to thrive  [name] - Presents from home where she is essentially bed-bound and with ongoing nausea and loss of appetite, hoping to be placed in SNF  - CT abdomen with acute abnormality - cirrhosis with portal HTN, splenomegaly, small volume ascites, nonocclusive portal vein thrombus.  Small L pleural effusion. Hyperdensity in L urinary bladder, possible small bladder stone.  Abnormal endometrial thickening.   - CT chest without acute pulm process - pulm hypertension - findings of cirrhosis and sequela of portal HTN - UA with positive nitrite, rare bacteria - > 100,000 gram negative rods on cx (follow speciation and sensitivities) -> continue ceftriaxone - she had paracentesis on 3/15 at last admission, not c/w SBP - small volume ascites seen on 3/28 imaging - could consider another diagnostic para this admission, though low suspicion for SBP - Nausea improved spontaneously in ED and she was able to take some oral medications  - Consult palliative, continue supportive care   - marinol for decreased appetite  2. Decompensated cirrhosis  - Continue PPI and lactulose, hold diuretic given recent loss of appetite and N/V  - Continue to monitor bili, LFT's, platelets, albumin.  Ammonia prn.  (INR not accurate as on Amanda [name])  3. Type II  DM  - A1c was only 5.1% earlier this month   - Continue CBG checks and low-intensity SSI   4. CKD IIIa  - SCr is 1.05, looks to be close to baseline    5. Myeloproliferative disorder  - chronic leukocytosis, worse today - PCT .22, not clearly suggestive of infection - follow UA, additional imaging - jakafi currently on hold - Will c/s Dr. Marin [name] on Monday -> he'll see her 3/30, appreciate assistance - recommended marinol, ok  with holding jakafi  6. Portal vein thrombosis  - Continue Amanda [name]   # CAD: continue Amanda [name]  # Hypertension: lasix and amlodipine on hold  # Glaucoma: continue eye drops  # Hypokalemia  Hypomag: replace and follow  DVT prophylaxis: Amanda [name] Code Status: DNR Family Communication: husband over phone Disposition Plan:  . Patient came from: home            . Anticipated d/c place: SNF . Barriers to d/c OR conditions which need to be met to effect Mohmmad Saleeby safe d/c: pending improvement in PO intake   Consultants:   none  Procedures:   none  Antimicrobials Anti-infectives (From admission, onward)   Start     Dose/Rate Route Frequency Ordered Stop   01/14/20 1700  cefTRIAXone (ROCEPHIN) 1 g in sodium chloride 0.9 % 100 mL IVPB     1 g 200 mL/hr over 30 Minutes Intravenous Every 24 hours 01/14/20 1651 01/21/20 1659         Subjective: No appetite this AM Doesn't want to be hospitalized (but no other options at this point)  Objective: Vitals:   01/14/20 1945 01/15/20 0339 01/15/20 0500 01/15/20 1459  BP: 140/63 (!) 129/53  (!) 138/57  Pulse: 87 84  76  Resp: 18 18  17   Temp: 98.4 F (36.9 C) 97.9 F (36.6 C)  98.6 F (37 C)  TempSrc: Oral Oral  Oral  SpO2: 97% 95%  97%  Weight:   97 kg     Intake/Output Summary (Last 24 hours) at 01/15/2020 1556 Last data filed at 01/15/2020 0700 Gross per 24 hour  Intake 884.66 ml  Output 450 ml  Net 434.66 ml   Filed Weights   01/15/20 0500  Weight: 97 kg    Examination:  General: No acute distress. Cardiovascular: Heart sounds show Nevaen Tredway regular rate, and rhythm Lungs: Clear to auscultation bilaterally  Abdomen: Soft, nontender, nondistended  Neurological: Alert and oriented 3. Moves all extremities 4 with equal strength. Cranial nerves II through XII grossly intact. Skin: Warm and dry. No rashes or lesions. Extremities: No clubbing or cyanosis. No edema.   Data Reviewed: I have personally reviewed following labs and  imaging studies  CBC: Recent Labs  Lab 01/13/20 0055 01/13/20 0407 01/14/20 0242  WBC 33.8* 30.3* 22.9*  NEUTROABS 29.7* 29.1* 20.6*  HGB 13.0 11.8* 10.2*  HCT 41.3 37.7 32.2*  MCV 92.2 93.3 91.7  PLT 316 246 779   Basic Metabolic Panel: Recent Labs  Lab 01/13/20 0055 01/13/20 0355 01/14/20 0242  NA 133* 135 136  K 4.1 3.8 3.3*  CL 97* 99 101  CO2 20* 22 25  GLUCOSE 166* 132* 117*  BUN 8 9 8   CREATININE 1.05* 1.00 1.03*  CALCIUM 9.1 8.8* 8.5*  MG  --  1.6* 1.6*  PHOS  --  3.2 2.8   GFR: Estimated Creatinine Clearance: 49.7 mL/min (Duquan Gillooly) (by C-G formula based on SCr of 1.03 mg/dL (H)). Liver Function Tests: Recent Labs  Lab 01/13/20 0055 01/13/20  0355 01/14/20 0242  AST 31 32 18  ALT 11 11 8   ALKPHOS 95 87 78  BILITOT 2.6* 2.5* 1.7*  PROT 6.1* 5.9* 5.1*  ALBUMIN 3.3* 3.1* 2.7*   Recent Labs  Lab 01/13/20 0055  LIPASE 27   Recent Labs  Lab 01/13/20 0055 01/14/20 0830  AMMONIA 43* 31   Coagulation Profile: Recent Labs  Lab 01/13/20 0055  INR 1.5*   Cardiac Enzymes: No results for input(s): CKTOTAL, CKMB, CKMBINDEX, TROPONINI in the last 168 hours. BNP (last 3 results) No results for input(s): PROBNP in the last 8760 hours. HbA1C: No results for input(s): HGBA1C in the last 72 hours. CBG: Recent Labs  Lab 01/14/20 1947 01/14/20 2334 01/15/20 0341 01/15/20 0741 01/15/20 1131  GLUCAP 134* 119* 99 119* 139*   Lipid Profile: No results for input(s): CHOL, HDL, LDLCALC, TRIG, CHOLHDL, LDLDIRECT in the last 72 hours. Thyroid Function Tests: No results for input(s): TSH, T4TOTAL, FREET4, T3FREE, THYROIDAB in the last 72 hours. Anemia Panel: No results for input(s): VITAMINB12, FOLATE, FERRITIN, TIBC, IRON, RETICCTPCT in the last 72 hours. Sepsis Labs: Recent Labs  Lab 01/13/20 0055 01/13/20 0355 01/14/20 0242 01/15/20 0349  PROCALCITON  --  0.22 0.48 0.51  LATICACIDVEN 2.3*  --   --   --     Recent Results (from the past 240 hour(s))    SARS CORONAVIRUS 2 (TAT 6-24 HRS) Nasopharyngeal Nasopharyngeal Swab     Status: None   Collection Time: 01/13/20  4:00 AM   Specimen: Nasopharyngeal Swab  Result Value Ref Range Status   SARS Coronavirus 2 NEGATIVE NEGATIVE Final    Comment: (NOTE) SARS-CoV-2 target nucleic acids are NOT DETECTED. The SARS-CoV-2 RNA is generally detectable in upper and lower respiratory specimens during the acute phase of infection. Negative results do not preclude SARS-CoV-2 infection, do not rule out co-infections with other pathogens, and should not be used as the sole basis for treatment or other patient management decisions. Negative results must be combined with clinical observations, patient history, and epidemiological information. The expected result is Negative. Fact Sheet for Patients: SugarRoll.be Fact Sheet for Healthcare Providers: https://www.woods-mathews.com/ This test is not yet approved or cleared by the [place] FDA and  has been authorized for detection and/or diagnosis of SARS-CoV-2 by FDA under an Emergency Use Authorization (EUA). This EUA will remain  in effect (meaning this test can be used) for the duration of the COVID-19 declaration under Section 56 4(b)(1) of the Act, 21 U.S.C. section 360bbb-3(b)(1), unless the authorization is terminated or revoked sooner. Performed at [facility], [place] [address], [place], [place] [address]   Culture, Urine     Status: Abnormal (Preliminary result)   Collection Time: 01/14/20  4:55 PM   Specimen: Urine, Random  Result Value Ref Range Status   Specimen Description URINE, RANDOM  Final   Special Requests NONE  Final   Culture (Atlee Villers)  Final    >=100,000 COLONIES/mL GRAM NEGATIVE RODS CULTURE REINCUBATED FOR BETTER GROWTH Performed at [facility], [place] [address] [place], [place] [address]    Report Status PENDING  Incomplete         Radiology Studies: CT CHEST W  CONTRAST  Result Date: 01/13/2020 CLINICAL DATA:  Concern for pleural effusion. EXAM: CT CHEST WITH CONTRAST TECHNIQUE: Multidetector CT imaging of the chest was performed during intravenous contrast administration. CONTRAST:  5m OMNIPAQUE IOHEXOL 300 MG/ML  SOLN COMPARISON:  Chest radiograph performed the same day and CT  chest dated 03/23/2018. FINDINGS: Cardiovascular: Sahib Pella right internal jugular central venous port catheter tip terminates at the superior cavoatrial junction. The main pulmonary artery is enlarged, measuring 3.6 cm in diameter. Vascular calcifications are seen in the coronary arteries and aortic arch. Normal heart size. No pericardial effusion. Mediastinum/Nodes: No enlarged mediastinal, hilar, or axillary lymph nodes. Thyroid gland, trachea, and esophagus demonstrate no significant findings. Lungs/Pleura: Ezeriah Luty 4 mm pulmonary nodule in the right lower lobe is not significantly changed for greater than 2 years and benign. There is mild bilateral dependent atelectasis. There is no focal consolidation, pleural effusion, or pneumothorax. Upper Abdomen: There is hepatic cirrhosis with small volume intraperitoneal fluid seen near the spleen. Nonocclusive thrombus in the main portal vein is not significantly changed. Musculoskeletal: No chest wall abnormality. No acute or significant osseous findings. IMPRESSION: 1. No acute pulmonary process. 2. Enlarged main pulmonary artery, suggestive of pulmonary hypertension. 3. Hepatic cirrhosis and sequela of portal hypertension as well as unchanged nonocclusive thrombus in the main portal vein. Aortic Atherosclerosis (ICD10-I70.0). Electronically Signed   By: Zerita Boers M.D.   On: 01/13/2020 18:47        Scheduled Meds: . apixaban  5 mg Oral BID  . dronabinol  2.5 mg Oral BID AC  . feeding supplement  1 Container Oral TID BM  . insulin aspart  0-9 Units Subcutaneous Q4H  . lactulose  20 g Oral BID  . latanoprost  1 drop Both Eyes QHS  . mirtazapine   15 mg Oral QHS  . multivitamin with minerals  1 tablet Oral Daily  . pantoprazole  40 mg Oral Daily  . timolol  1 drop Both Eyes Daily   Continuous Infusions: . sodium chloride 50 mL/hr at 01/15/20 0700  . cefTRIAXone (ROCEPHIN)  IV Stopped (01/14/20 1813)     LOS: 1 day    Time spent: over 37 min    Fayrene Helper, MD [facility]   To contact the attending provider between 7A-7P or the covering provider during after hours 7P-7A, please log into the web site [email or web address] and access using universal [place] password for that web site. If you do not have the password, please call the hospital operator.  01/15/2020, 3:56 PM

## 2020-01-15 NOTE — Consult Note (Signed)
Consultation Note Date: 01/15/2020   Patient Name: Amanda [name]  DOB: 1941-01-24  MRN: [id number]  Age / Sex: 79 y.o., female  PCP: [name], NP Referring Physician: Elodia Florence., *  Reason for Consultation: Establishing goals of care  HPI/Patient Profile: 79 y.o. female  with past medical history of CAD, T2DM, myeloproliferative disorder, decompensated liver cirrhosis, CKD, and portal vein thrombosis admitted on 01/13/2020 with nausea, vomiting, and anorexia. Patient had admission about 2 weeks ago for colitis. Found to have [name]. She was admitted for failure to thrive. PMT consulted to assist with [place].  Clinical Assessment and Goals of Care: I have reviewed medical records including EPIC notes, labs and imaging, assessed the patient and then met with patient and spouse  to discuss diagnosis prognosis, GOC, EOL wishes, disposition and options.  I introduced Palliative Medicine as specialized medical care for people living with serious illness. It focuses on providing relief from the symptoms and stress of a serious illness. The goal is to improve quality of life for both the patient and the family.  As far as functional and nutritional status, patient and spouse both share about patient's recent decline. Has been essentially bedbound for about a month with poor PO intake. Dependent in all ADLs. Husband has been unable to provide level of care patient currently requires.    We discussed patient's current illness and what it means in the larger context of patient's on-going co-morbidities. Family has good understanding of patient's medical decision after discussion with hospitalist and Dr. [name].   I attempted to elicit values and goals of care important to the patient.  At this point, patient and family are focused on rehabilitation. They are encouraged as patient has had an improved appetite today - ate a bowl of soup. They  are also eager to see if newly ordered marinol by Dr. [name] improves her appetite.   Patient has signed DNR on chart. We discussed importance of continued goals of care conversations throughout patient's illness. Family is agreeable to follow up with palliative at rehab facility. Not interested in discussing hospice services.   Questions and concerns were addressed. The family was encouraged to call with questions or concerns.   Primary Decision Maker PATIENT    SUMMARY OF RECOMMENDATIONS   - TOC referral for palliative to follow at rehab - family interested in rehabilitation with goal to return home  Code Status/Advance Care Planning:  DNR  Symptom Management:   marinol ordered by Dr. [name]  Prognosis:   Unable to determine  Discharge Planning: [place] for rehab with Palliative care service follow-up      Primary Diagnoses: Present on Admission: . Nausea & vomiting . Failure to thrive in adult . CKD (chronic kidney disease), stage III . Cirrhosis ([place]) . Type 2 diabetes mellitus with diabetic polyneuropathy ([place]) . Myeloproliferative disorder ([place]) . Portal vein thrombosis   I have reviewed the medical record, interviewed the patient and family, and examined the patient. The following aspects are pertinent.  Past Medical History:  Diagnosis Date  . Allergic rhinitis 01/23/2016  . C. difficile colitis   . CAD (coronary artery disease)    a. s/p STEMI in 01/2015 with 95% LCx stenosis and distal 80% LCx stenosis (DESx2 placed)  . Cancer ([place])    SKIN  . Candidiasis of skin 09/30/2014  . Cirrhosis ([place])   . Depression   . Diabetes mellitus 2008  . Gout   . Herpes simplex   .  Hyperglycemia 05/31/2013  . Hyperlipidemia   . Hypertension   . Myocardial infarction ([place])   . 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 Obesity   . Polycythemia    Dr. Elease Hashimoto- [facility]  . Psoriasis    Social History   Socioeconomic History  .  Marital status: Married    Spouse name: [name]  . Number of children: 2  . Years of education: 2 yr colle  . Highest education level: Not on file  Occupational History  . Occupation: housewife  Tobacco Use  . Smoking status: Former Smoker    Years: 48.00    Types: Cigarettes    Quit date: 10/18/1986    Years since quitting: 33.2  . Smokeless tobacco: Never Used  Substance and Sexual Activity  . Alcohol use: No    Alcohol/week: 0.0 standard drinks  . Drug use: No  . Sexual activity: Yes    Partners: Male    Birth control/protection: None  Other Topics Concern  . Not on file  Social History Narrative   2 caffeine drink per day.  No regular exercise.     Retired from the bank   2 children (Daughter and a son) son has morbid obesity   2 grandchildren   3 great grandchildren   Social Determinants of Radio broadcast assistant Strain:   . Difficulty of Paying Living Expenses:   Food Insecurity:   . Worried About Charity fundraiser in the Last Year:   . Arboriculturist in the Last Year:   Transportation Needs:   . Film/video editor (Medical):   Marland Kitchen Lack of Transportation (Non-Medical):   Physical Activity:   . Days of Exercise per Week:   . Minutes of Exercise per Session:   Stress:   . Feeling of Stress :   Social Connections:   . Frequency of Communication with Friends and Family:   . Frequency of Social Gatherings with Friends and Family:   . Attends Religious Services:   . Active Member of Clubs or Organizations:   . Attends Archivist Meetings:   Marland Kitchen Marital Status:    Family History  Problem Relation Age of Onset  . Diabetes Mother   . Heart disease Mother        CAD  . Hyperlipidemia Mother   . Hypertension Mother   . Kidney disease Mother   . Kidney disease Father   . Breast cancer Maternal Aunt   . Heart disease Maternal Grandmother   . Heart disease Other        maternal aunts and uncles  . Colon cancer Neg Hx   . Esophageal cancer Neg Hx     Scheduled Meds: . apixaban  5 mg Oral BID  . dronabinol  2.5 mg Oral BID AC  . feeding supplement  1 Container Oral TID BM  . insulin aspart  0-9 Units Subcutaneous Q4H  . lactulose  20 g Oral BID  . latanoprost  1 drop Both Eyes QHS  . mirtazapine  15 mg Oral QHS  . multivitamin with minerals  1 tablet Oral Daily  . pantoprazole  40 mg Oral Daily  . timolol  1 drop Both Eyes Daily   Continuous Infusions: . sodium chloride 50 mL/hr at 01/15/20 0700  . cefTRIAXone (ROCEPHIN)  IV Stopped (01/14/20 1813)   PRN Meds:.diphenhydrAMINE, ondansetron **OR** ondansetron (ZOFRAN) IV Allergies  Allergen Reactions  . Doxycycline Hives, Swelling and Rash  .  Penicillins Swelling    Has patient had a PCN reaction causing immediate rash, facial/tongue/throat swelling, SOB or lightheadedness with hypotension: No Has patient had a PCN reaction causing severe rash involving mucus membranes or skin necrosis: No Has patient had a PCN reaction that required hospitalization: No Has patient had a PCN reaction occurring within the last 10 years: No If all of the above answers are "NO", then may proceed with Cephalosporin use.  Marland Kitchen Lisinopril Cough  . Tape Hives  . Latex Hives, Itching and Rash   Review of Systems  Constitutional: Positive for activity change and appetite change.    Physical Exam Constitutional:      General: She is not in acute distress. Pulmonary:     Effort: Pulmonary effort is normal. No respiratory distress.  Skin:    General: Skin is warm and dry.  Neurological:     Mental Status: She is alert and oriented to person, place, and time.  Psychiatric:        Mood and Affect: Mood normal.        Behavior: Behavior normal.     Vital Signs: BP (!) 129/53   Pulse 84   Temp 97.9 F (36.6 C) (Oral)   Resp 18   Wt 97 kg   SpO2 95%   BMI 36.14 kg/m  Pain Scale: 0-10   Pain Score: 0-No pain   SpO2: SpO2: 95 % O2 Device:SpO2: 95 % O2 Flow Rate: .O2 Flow Rate (L/min):  2 L/min  IO: Intake/output summary:   Intake/Output Summary (Last 24 hours) at 01/15/2020 1259 Last data filed at 01/15/2020 0700 Gross per 24 hour  Intake 1004.66 ml  Output 450 ml  Net 554.66 ml    LBM: Last BM Date: 01/14/20 Baseline Weight: Weight: 97 kg Most recent weight: Weight: 97 kg     Palliative Assessment/Data: PPS 40%    Time Total: 50 minutes Greater than 50%  of this time was spent counseling and coordinating care related to the above assessment and plan.  Juel Burrow, DNP, AGNP-C Palliative Medicine Team [phone] Pager: [phone]

## 2020-01-15 NOTE — Plan of Care (Signed)
  Problem: Education: Goal: Knowledge of General Education information will improve Description: Including pain rating scale, medication(s)/side effects and non-pharmacologic comfort measures Outcome: Progressing   Problem: Health Behavior/Discharge Planning: Goal: Ability to manage health-related needs will improve Outcome: Progressing   Problem: Clinical Measurements: Goal: Ability to maintain clinical measurements within normal limits will improve Outcome: Progressing Goal: Diagnostic test results will improve Outcome: Progressing Goal: Respiratory complications will improve Outcome: Progressing   Problem: Nutrition: Goal: Adequate nutrition will be maintained Outcome: Progressing   Problem: Elimination: Goal: Will not experience complications related to bowel motility Outcome: Progressing Goal: Will not experience complications related to urinary retention Outcome: Progressing   Problem: Pain Managment: Goal: General experience of comfort will improve Outcome: Progressing   Problem: Safety: Goal: Ability to remain free from injury will improve Outcome: Progressing   Problem: Skin Integrity: Goal: Risk for impaired skin integrity will decrease Outcome: Progressing

## 2020-01-15 NOTE — Consult Note (Signed)
Referral MD  Reason for Referral: Failure to thrive; cirrhosis-NASH; myeloproliferative disorder; diabetes  Chief Complaint  Patient presents with  . Fatigue  : I just want to go home.  HPI: Ms. Ridings is well-known to me.  She is an 79 year old white female.  She has a longstanding history of a myeloproliferative neoplasm.  She has a JAK2 positive polycythemia.  She is on Jakafi.  This is helped control her blood counts very nicely.  Unfortunately, she has had longstanding diabetes.  Her diabetes has really not been all that well controlled.  She has now developed cirrhosis.  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  She recently was hospitalized.  She had ascites.  She had a nonocclusive portal vein thrombus from the cirrhosis.  She was subsequently discharged on Eliquis.  At home, she did not thrive at all.  Her husband cannot take care of her.  She did not eat.  She really did not get out of bed.  She was readmitted on 01/13/2020.  When she came in, her white cell count was 30.3.  Hemoglobin 11.8.  Platelet count 246,000.  Her blood sugar was 132.  Her BUN was 9 creatinine 1.0.  Calcium 8.8 with an albumin of 3.1.  Her bilirubin was 2.5.  She had a CT scan of the body.  She had hepatic cirrhosis with the portal hypertension.  She has a nonocclusive thrombus.  She had an enlarged main pulmonary artery.  She also has splenomegaly.  She had some ascites but not much.  She still has debilitation.  She wants to go home.  Her husband is just not able to take care of her at home.  Clearly, her prognosis is good be dictated by this cirrhosis.  Her myeloproliferative process has been very well controlled.  She really has had no problems with the [facility].  She has had no bleeding.  She has had no fever.  Overall, her performance status is ECOG 3.   Past Medical History:  Diagnosis Date  . Allergic rhinitis 01/23/2016  . C. difficile colitis   . CAD (coronary artery disease)    a. s/p STEMI in  01/2015 with 95% LCx stenosis and distal 80% LCx stenosis (DESx2 placed)  . Cancer ([place])    SKIN  . Candidiasis of skin 09/30/2014  . Cirrhosis ([place])   . Depression   . Diabetes mellitus 2008  . Gout   . Herpes simplex   . Hyperglycemia 05/31/2013  . Hyperlipidemia   . Hypertension   . Myocardial infarction ([place])   . Neuromuscular disorder ([place])    BELL PALSY  . Obesity   . Polycythemia    Dr. Elease Hashimoto- [facility]  . Psoriasis   :  Past Surgical History:  Procedure Laterality Date  . ANKLE FRACTURE SURGERY    . BIOPSY  05/26/2018   Procedure: BIOPSY;  Surgeon: [name], MD;  Location: [facility] ENDOSCOPY;  Service: Endoscopy;;  . BREAST SURGERY Left    milk duct  . CATARACT EXTRACTION, BILATERAL    . COLONOSCOPY    . ESOPHAGOGASTRODUODENOSCOPY N/A 05/26/2018   Procedure: ESOPHAGOGASTRODUODENOSCOPY (EGD);  Surgeon: [name], MD;  Location: [facility] ENDOSCOPY;  Service: Endoscopy;  Laterality: N/A;  . IR IMAGING GUIDED PORT INSERTION  08/30/2019  . IR PARACENTESIS  05/25/2018  . LEFT HEART CATHETERIZATION WITH CORONARY ANGIOGRAM N/A 01/27/2015   Procedure: LEFT HEART CATHETERIZATION WITH CORONARY ANGIOGRAM;  Surgeon: [name], MD;  Location: [facility] CATH LAB;  Service: Cardiovascular;  Laterality: N/A;  . TONSILLECTOMY    . TOTAL ABDOMINAL HYSTERECTOMY W/ BILATERAL SALPINGOOPHORECTOMY     for heavy periods with appendectomy  :   Current Facility-Administered Medications:  .  0.9 %  sodium chloride infusion, , Intravenous, Continuous, Elodia Florence., MD, Last Rate: 50 mL/hr at 01/15/20 0600, Rate Verify at 01/15/20 0600 .  apixaban (ELIQUIS) tablet 5 mg, 5 mg, Oral, BID, Opyd, Ilene Qua, MD, 5 mg at 01/14/20 2204 .  cefTRIAXone (ROCEPHIN) 1 g in sodium chloride 0.9 % 100 mL IVPB, 1 g, Intravenous, Q24H, Elodia Florence., MD, Stopped at 01/14/20 1813 .  diphenhydrAMINE (BENADRYL) 12.5 MG/5ML elixir 12.5 mg, 12.5 mg, Oral, Q6H PRN, Elodia Florence., MD, 12.5 mg  at 01/14/20 1744 .  feeding supplement (BOOST / RESOURCE BREEZE) liquid 1 Container, 1 Container, Oral, TID BM, Elodia Florence., MD, 1 Container at 01/14/20 1943 .  insulin aspart (novoLOG) injection 0-9 Units, 0-9 Units, Subcutaneous, Q4H, Opyd, Ilene Qua, MD, 1 Units at 01/14/20 1948 .  lactulose (CHRONULAC) 10 GM/15ML solution 20 g, 20 g, Oral, BID, Opyd, Ilene Qua, MD, 20 g at 01/14/20 2204 .  latanoprost (XALATAN) 0.005 % ophthalmic solution 1 drop, 1 drop, Both Eyes, QHS, Elodia Florence., MD, 1 drop at 01/14/20 2204 .  mirtazapine (REMERON) tablet 15 mg, 15 mg, Oral, QHS, Elodia Florence., MD, 15 mg at 01/14/20 2204 .  multivitamin with minerals tablet 1 tablet, 1 tablet, Oral, Daily, Elodia Florence., MD, 1 tablet at 01/14/20 1612 .  ondansetron (ZOFRAN) tablet 4 mg, 4 mg, Oral, Q6H PRN **OR** ondansetron (ZOFRAN) injection 4 mg, 4 mg, Intravenous, Q6H PRN, Opyd, Ilene Qua, MD, 4 mg at 01/14/20 1023 .  pantoprazole (PROTONIX) EC tablet 40 mg, 40 mg, Oral, Daily, Opyd, Ilene Qua, MD, 40 mg at 01/14/20 0950 .  timolol (TIMOPTIC) 0.5 % ophthalmic solution 1 drop, 1 drop, Both Eyes, Daily, Elodia Florence., MD, 1 drop at 01/14/20 0950:  . apixaban  5 mg Oral BID  . feeding supplement  1 Container Oral TID BM  . insulin aspart  0-9 Units Subcutaneous Q4H  . lactulose  20 g Oral BID  . latanoprost  1 drop Both Eyes QHS  . mirtazapine  15 mg Oral QHS  . multivitamin with minerals  1 tablet Oral Daily  . pantoprazole  40 mg Oral Daily  . timolol  1 drop Both Eyes Daily  :  Allergies  Allergen Reactions  . Doxycycline Hives, Swelling and Rash  . Penicillins Swelling    Has patient had a PCN reaction causing immediate rash, facial/tongue/throat swelling, SOB or lightheadedness with hypotension: No Has patient had a PCN reaction causing severe rash involving mucus membranes or skin necrosis: No Has patient had a PCN reaction that required hospitalization:  No Has patient had a PCN reaction occurring within the last 10 years: No If all of the above answers are "NO", then may proceed with Cephalosporin use.  Marland Kitchen Lisinopril Cough  . Tape Hives  . Latex Hives, Itching and Rash  :  Family History  Problem Relation Age of Onset  . Diabetes Mother   . Heart disease Mother        CAD  . Hyperlipidemia Mother   . Hypertension Mother   . Kidney disease Mother   . Kidney disease Father   . Breast cancer Maternal Aunt   . Heart disease Maternal Grandmother   .  Heart disease Other        maternal aunts and uncles  . Colon cancer Neg Hx   . Esophageal cancer Neg Hx   :  Social History   Socioeconomic History  . Marital status: Married    Spouse name: [name]  . Number of children: 2  . Years of education: 2 yr colle  . Highest education level: Not on file  Occupational History  . Occupation: housewife  Tobacco Use  . Smoking status: Former Smoker    Years: 48.00    Types: Cigarettes    Quit date: 10/18/1986    Years since quitting: 33.2  . Smokeless tobacco: Never Used  Substance and Sexual Activity  . Alcohol use: No    Alcohol/week: 0.0 standard drinks  . Drug use: No  . Sexual activity: Yes    Partners: Male    Birth control/protection: None  Other Topics Concern  . Not on file  Social History Narrative   2 caffeine drink per day.  No regular exercise.     Retired from the bank   2 children (Daughter and a son) son has morbid obesity   2 grandchildren   3 great grandchildren   Social Determinants of Radio broadcast assistant Strain:   . Difficulty of Paying Living Expenses:   Food Insecurity:   . Worried About Charity fundraiser in the Last Year:   . Arboriculturist in the Last Year:   Transportation Needs:   . Film/video editor (Medical):   Marland Kitchen Lack of Transportation (Non-Medical):   Physical Activity:   . Days of Exercise per Week:   . Minutes of Exercise per Session:   Stress:   . Feeling of Stress :    Social Connections:   . Frequency of Communication with Friends and Family:   . Frequency of Social Gatherings with Friends and Family:   . Attends Religious Services:   . Active Member of Clubs or Organizations:   . Attends Archivist Meetings:   Marland Kitchen Marital Status:   Intimate Partner Violence:   . Fear of Current or Ex-Partner:   . Emotionally Abused:   Marland Kitchen Physically Abused:   . Sexually Abused:   :  Review of Systems  Constitutional: Positive for malaise/fatigue.  HENT: Negative.   Eyes: Negative.   Respiratory: Negative.   Cardiovascular: Negative.   Gastrointestinal: Positive for abdominal pain and nausea.  Genitourinary: Negative.   Musculoskeletal: Positive for back pain, joint pain and myalgias.  Skin: Negative.   Neurological: Negative.   Endo/Heme/Allergies: Negative.   Psychiatric/Behavioral: Negative.      Exam: See above Patient Vitals for the past 24 hrs:  BP Temp Temp src Pulse Resp SpO2 Weight  01/15/20 0500 -- -- -- -- -- -- 213 lb 13.5 oz (97 kg)  01/15/20 0339 (!) 129/53 97.9 F (36.6 C) Oral 84 18 95 % --  01/14/20 1945 140/63 98.4 F (36.9 C) Oral 87 18 97 % --  01/14/20 1250 (!) 125/55 98 F (36.7 C) Oral 79 16 97 % --     Recent Labs    01/13/20 0407 01/14/20 0242  WBC 30.3* 22.9*  HGB 11.8* 10.2*  HCT 37.7 32.2*  PLT 246 177   Recent Labs    01/13/20 0355 01/14/20 0242  NA 135 136  K 3.8 3.3*  CL 99 101  CO2 22 25  GLUCOSE 132* 117*  BUN 9 8  CREATININE 1.00 1.03*  CALCIUM 8.8* 8.5*    Blood smear review: None  Pathology: None    Assessment and Plan: Ms. Baum is a nice 79 year old white female.  She has a longstanding myeloproliferative disorder.  As far as I know, this is not progressed or transformed.  Everything is been holding very stable with this.  Again, her prognosis is good be based upon the cirrhosis.  She has had longstanding diabetes which has not been well controlled.  It is doing better now which  is nice to see.  She is on Eliquis.  I would keep her on the Eliquis for the portal vein thrombus.  The real problem is that she is not able to go home.  Her husband just cannot take care of her at home.  He really does not want to send her to a nursing home.  I told her that she just has to eat better.  I am not sure how we can accomplish this.  Maybe we can start her on some Marinol.  I would not use Megace because of the blood clot in her portal vein.  We will try to help as much as we can.  She currently has had the [facility] on hold.  I have no problems with holding this.  Lattie Haw, MD  [place] 12:12

## 2020-01-15 NOTE — Progress Notes (Signed)
Ship broker received referral for pt to participate in [facility] palliative program once pt discharges to SNF.   Liaison to follow while in hospital to alert [facility] palliative team who will then outreach family/SNF to arrange Palliative admission visit.     Please do not hesitate to call with any questions and thank you for the referral.     Freddie Breech, RN  [facility] Liaison   [phone]

## 2020-01-15 NOTE — Telephone Encounter (Signed)
Pt currently admitted to hospital.  Will follow.

## 2020-01-16 DIAGNOSIS — E440 Moderate protein-calorie malnutrition: Secondary | ICD-10-CM | POA: Insufficient documentation

## 2020-01-16 LAB — MAGNESIUM: Magnesium: 2 mg/dL (ref 1.7–2.4)

## 2020-01-16 LAB — URINE CULTURE: Culture: >=100000 — AB

## 2020-01-16 LAB — GLUCOSE, CAPILLARY
Glucose-Capillary: 110 mg/dL — ABNORMAL HIGH (ref 70–99)
Glucose-Capillary: 163 mg/dL — ABNORMAL HIGH (ref 70–99)
Glucose-Capillary: 181 mg/dL — ABNORMAL HIGH (ref 70–99)
Glucose-Capillary: 191 mg/dL — ABNORMAL HIGH (ref 70–99)
Glucose-Capillary: 226 mg/dL — ABNORMAL HIGH (ref 70–99)
Glucose-Capillary: 99 mg/dL (ref 70–99)

## 2020-01-16 LAB — COMPREHENSIVE METABOLIC PANEL
ALT: 8 U/L (ref 0–44)
AST: 19 U/L (ref 15–41)
Albumin: 2.5 g/dL — ABNORMAL LOW (ref 3.5–5.0)
Alkaline Phosphatase: 75 U/L (ref 38–126)
Anion gap: 10 (ref 5–15)
BUN: 8 mg/dL (ref 8–23)
CO2: 23 mmol/L (ref 22–32)
Calcium: 8.2 mg/dL — ABNORMAL LOW (ref 8.9–10.3)
Chloride: 100 mmol/L (ref 98–111)
Creatinine, Ser: 1.08 mg/dL — ABNORMAL HIGH (ref 0.44–1.00)
GFR calc Af Amer: 56 mL/min — ABNORMAL LOW (ref >60)
GFR calc non Af Amer: 48 mL/min — ABNORMAL LOW (ref >60)
Glucose, Bld: 110 mg/dL — ABNORMAL HIGH (ref 70–99)
Potassium: 3 mmol/L — ABNORMAL LOW (ref 3.5–5.1)
Sodium: 133 mmol/L — ABNORMAL LOW (ref 135–145)
Total Bilirubin: 0.9 mg/dL (ref 0.3–1.2)
Total Protein: 4.7 g/dL — ABNORMAL LOW (ref 6.5–8.1)

## 2020-01-16 LAB — CBC WITH DIFFERENTIAL/PLATELET
Abs Immature Granulocytes: 0.33 10*3/uL — ABNORMAL HIGH (ref 0.00–0.07)
Basophils Absolute: 0.1 10*3/uL (ref 0.0–0.1)
Basophils Relative: 1 %
Eosinophils Absolute: 0 10*3/uL (ref 0.0–0.5)
Eosinophils Relative: 0 %
HCT: 29.3 % — ABNORMAL LOW (ref 36.0–46.0)
Hemoglobin: 9.2 g/dL — ABNORMAL LOW (ref 12.0–15.0)
Immature Granulocytes: 2 %
Lymphocytes Relative: 8 %
Lymphs Abs: 1.2 10*3/uL (ref 0.7–4.0)
MCH: 28.9 pg (ref 26.0–34.0)
MCHC: 31.4 g/dL (ref 30.0–36.0)
MCV: 92.1 fL (ref 80.0–100.0)
Monocytes Absolute: 0.9 10*3/uL (ref 0.1–1.0)
Monocytes Relative: 6 %
Neutro Abs: 13.1 10*3/uL — ABNORMAL HIGH (ref 1.7–7.7)
Neutrophils Relative %: 83 %
Platelets: 154 10*3/uL (ref 150–400)
RBC: 3.18 MIL/uL — ABNORMAL LOW (ref 3.87–5.11)
RDW: 19.9 % — ABNORMAL HIGH (ref 11.5–15.5)
WBC: 15.6 10*3/uL — ABNORMAL HIGH (ref 4.0–10.5)
nRBC: 0.3 % — ABNORMAL HIGH (ref 0.0–0.2)

## 2020-01-16 LAB — PHOSPHORUS: Phosphorus: 2.4 mg/dL — ABNORMAL LOW (ref 2.5–4.6)

## 2020-01-16 MED ORDER — ENSURE ENLIVE PO LIQD
237.0000 mL | Freq: Two times a day (BID) | ORAL | Status: DC
  Administered 2020-01-16 – 2020-01-22 (×10): 237 mL via ORAL

## 2020-01-16 MED ORDER — SODIUM CHLORIDE 0.9 % IV SOLN
1.0000 g | Freq: Two times a day (BID) | INTRAVENOUS | Status: DC
  Administered 2020-01-16 – 2020-01-21 (×10): 1 g via INTRAVENOUS
  Filled 2020-01-16 (×13): qty 1

## 2020-01-16 MED ORDER — CHLORHEXIDINE GLUCONATE CLOTH 2 % EX PADS
6.0000 | MEDICATED_PAD | Freq: Every day | CUTANEOUS | Status: DC
  Administered 2020-01-16 – 2020-01-25 (×9): 6 via TOPICAL

## 2020-01-16 MED ORDER — CHLORHEXIDINE GLUCONATE CLOTH 2 % EX PADS
6.0000 | MEDICATED_PAD | Freq: Every day | CUTANEOUS | Status: DC
  Administered 2020-01-17 – 2020-01-24 (×3): 6 via TOPICAL

## 2020-01-16 MED ORDER — POTASSIUM CHLORIDE CRYS ER 20 MEQ PO TBCR
40.0000 meq | EXTENDED_RELEASE_TABLET | ORAL | Status: AC
  Administered 2020-01-16 (×2): 40 meq via ORAL
  Filled 2020-01-16 (×2): qty 2

## 2020-01-16 MED ORDER — SODIUM CHLORIDE 0.9% FLUSH
10.0000 mL | INTRAVENOUS | Status: DC | PRN
  Administered 2020-01-23 – 2020-01-25 (×2): 10 mL

## 2020-01-16 NOTE — TOC Progression Note (Signed)
Transition of Care [facility]) - Progression Note    Patient Details  Name: Amanda [name] MRN: [id number] Date of Birth: [birth year]  [facility] CM/SW [place], [place] Phone Number: 01/16/2020, 1:41 PM  Clinical Narrative:    CSW spoke with pt and pt husband at bedside. Introduced self, role, reason for visit. Pt husband has chosen [facility] for rehab. Briefly explained insurance auth process, will request that Architectural technologist.    Expected Discharge Plan: [place] Barriers to Discharge: Continued Medical Work up, Orthoptist and Services Expected Discharge Plan: [place] In-house Referral: Clinical Social Work Discharge Planning Services: [employer] Consult Post Acute Care Choice: [place] arrangements for the past 2 months: Single Family Home  Readmission Risk Interventions No flowsheet data found.

## 2020-01-16 NOTE — Progress Notes (Signed)
Pharmacy Antibiotic Note  Amanda [name] is a 79 y.o. female admitted on 01/13/2020 with UTI.  Use of piperacillin/tazobactam (Zosyn) was considered but PCN is listed as an allergy and husband confirmed that PCN caused facial swelling.  Pharmacy has been consulted for meropenem dosing.  Urine culture reveals acinetobacter calcoaceticus/baumannii and E. Coli.  I reviewed results with provider. Acinetobacter could be contaminant or colonization but provider would like to go ahead and treat covering both organisms.   Today, patient is afebrile, WBC down to 15.6, and estimated CrCl <50 ml/min.  Plan: Meropenem 1g IV q12h Monitor clinical picture, renal function, LOT   Weight: 210 lb 12.2 oz (95.6 kg)  Temp (24hrs), Avg:98.5 F (36.9 C), Min:98.4 F (36.9 C), Max:98.6 F (37 C)  Recent Labs  Lab 01/13/20 0055 01/13/20 0355 01/13/20 0407 01/14/20 0242 01/15/20 1622 01/16/20 0158  WBC 33.8*  --  30.3* 22.9* 21.7* 15.6*  CREATININE 1.05* 1.00  --  1.03* 1.06* 1.08*  LATICACIDVEN 2.3*  --   --   --   --   --     Estimated Creatinine Clearance: 47.1 mL/min (A) (by C-G formula based on SCr of 1.08 mg/dL (H)).    Allergies  Allergen Reactions  . Doxycycline Hives, Swelling and Rash  . Penicillins Swelling    Has patient had a PCN reaction causing immediate rash, facial/tongue/throat swelling, SOB or lightheadedness with hypotension: No Has patient had a PCN reaction causing severe rash involving mucus membranes or skin necrosis: No Has patient had a PCN reaction that required hospitalization: No Has patient had a PCN reaction occurring within the last 10 years: No If all of the above answers are "NO", then may proceed with Cephalosporin use.  Marland Kitchen Lisinopril Cough  . Tape Hives  . Latex Hives, Itching and Rash    Antimicrobials this admission: CTX 3/29 >> 3/31 Meropenem 3/31 >>  Dose adjustments this admission: n/a  Microbiology results: 3/13 BCx: NGTD 3/29 UCx: >=100,000  acinetobacyer calcoaceticus/baumannii complex and >=100,000 E. Coli   Thank you for allowing pharmacy to be a part of this patient's care.  Efraim Kaufmann 01/16/2020 2:16 PM

## 2020-01-16 NOTE — Progress Notes (Signed)
Nutrition Follow-up  DOCUMENTATION CODES:   Non-severe (moderate) malnutrition in context of chronic illness  INTERVENTION:   -D/c Boost Breeze po TID, each supplement provides 250 kcal and 9 grams of protein -Ensure Enlive po BID, each supplement provides 350 kcal and 20 grams of protein -Magic cup TID with meals, each supplement provides 290 kcal and 9 grams of protein -Continue MVI with minerals daily  NUTRITION DIAGNOSIS:   Moderate Malnutrition related to chronic illness(cirrhosis) as evidenced by moderate muscle depletion, mild muscle depletion, energy intake < 75% for > or equal to 1 month.  Ongoing  GOAL:   Patient will meet greater than or equal to 90% of their needs  Progressing   MONITOR:   PO intake, Supplement acceptance, Weight trends, Labs, I & O's, Diet advancement  REASON FOR ASSESSMENT:   Consult Assessment of nutrition requirement/status  ASSESSMENT:   Patient with PMH significant for CAD, DM, myeloproliferative disorder, decompensated liver cirrhosis, CKD III, portal vein vein thrombosis, and recent admission for colitis. Presents this admission with failure to thrive.  3/30- marinol added by oncology, advanced to soft diet  Reviewed I/O's: +922 ml x 24 hours and +2 L since admission  Pt working with therapies at time of visit.   Case discussed with RN and nurse tech, who reports pt has been refusing Boost Breeze supplements. Pt is tolerating soft diet well, but with poor appetite. Nurse tech estimates pt consumed about 25% of meal. Pt husband offered her a donut, however, declined at time of offer.   Palliative care following for goals of care; plan to d/c to SNF for further rehab with palliative care follow-up.   Labs reviewed: Na: 133, K: 3.0, PhosL 2.4, CBGS: 99-141.   Diet Order:   Diet Order            DIET SOFT Room service appropriate? Yes; Fluid consistency: Thin  Diet effective now              EDUCATION NEEDS:   Education  needs have been addressed  Skin:  Skin Assessment: Skin Integrity Issues: Skin Integrity Issues:: Other (Comment) Other: perineum  Last BM:  PTA  Height:   Ht Readings from Last 1 Encounters:  12/28/19 5' 4.5" (1.638 m)    Weight:   Wt Readings from Last 1 Encounters:  01/16/20 95.6 kg    BMI:  Body mass index is 35.62 kg/m.  Estimated Nutritional Needs:   Kcal:  2000-2200 kcal  Protein:  100-120 grams  Fluid:  >/= 2 L/day    Loistine Chance, RD, LDN, [place] Registered Dietitian II Certified Diabetes Care and Education Specialist Please refer to [facility] for RD and/or RD on-call/weekend/after hours pager

## 2020-01-16 NOTE — Progress Notes (Addendum)
PROGRESS NOTE    Amanda [name]  FTD:[id number] DOB: Mar 23, 1941 DOA: 01/13/2020 PCP: [name], NP   Brief Narrative:  HPI On 01/13/2020 by Dr. Mitzi Hansen Amanda [name] is a 79 y.o. 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  Since recent hospitalization, the patient has remained in bed most of the time, not eating or drinking much, and not taking her medications. Symptoms wax and wane, she is able to tolerate small bites and sips sometimes. She denies fever or chills. SNF had been recommended by PT and physicians prior to the recent discharge but patient wanted to go home and her husband thought he would be able to care for her there. She now wants to go a nursing home. She had a visit with her PCP 2 days ago and it was advised that she return to the hospital in order to be placed in a nursing facility.   Interim history Failure to thrive.  She essentially is bedbound but has ongoing nausea and loss of appetite.  Also has decompensated cirrhosis.  TOC consulted.  Palliative care consulted. Assessment & Plan   Failure to thrive/ Moderate malnutrition  -Patient presented from home where she essentially is bedbound with ongoing nausea and loss of appetite might have been replaced at a nursing facility -CT abdomen without acute abnormality.  Patient does have cirrhosis with portal hypertension, splenomegaly, small volume ascites, nonocclusive portal vein thrombosis.  Small left pleural effusion.  Hypodensity in the left urinary bladder, possible small bladder stone.  Abnormal endometrial thickening. -CT chest without acute pulmonary process.  Positive for pulmonary hypertension, findings of cirrhosis  with sequela of portal hypertension -Continue antiemetics as needed -Palliative care consulted and appreciated, continue supportive care -Continue Marinol for decreased appetite -Nutrition consulted, continue supplements  E. Coli/ Actinobacter UTI -Initially was placed on ceftriaxone -Urine culture showed>100k E. coli and actinobacter calcoaceticus/baumanii complex -Both sensitive to Zosyn however patient with penicillin allergy therefore placed on Merrem  Decompensated cirrhosis  -Continue PPI continue lactulose -Diuretic held given recent loss of appetite with nausea and vomiting -Continue to monitor LFTs, bilirubin, platelets and albumin  Diabetes mellitus, type II -Hemoglobin A1c was 5.1 earlier this month -Continue insulin sliding scale and CBG monitoring  Chronic kidney disease, stage IIIa -Creatinine appears to be at baseline  Myeloproliferative disorder -Patient with chronic leukocytosis -Procalcitonin 0.22, not clearly suggestive of infection -We will continue to treat UTI as above -Jakafi held -Oncology, Dr. [name], consulted and appreciated  Portal vein thrombosis -Continue Eliquis  Coronary artery disease -No complaints of chest pain at this time -Continue Eliquis  Essential hypertension -Lasix and amlodipine on hold   Glaucoma -continue eyedrops  Hypokalemia/hypomagnesemia -Hypomagnesemia resolved with replacement -Potassium still 3, will continue to replace and monitor  DVT Prophylaxis Eliquis  Code Status: DNR  Family Communication: None at bedside  Disposition Plan: Admitted from home.  Pending SNF placement as family is unable to care for her at this time and patient has had poor oral intake.  Consultants Oncology Palliative care  Procedures  None  Antibiotics   Anti-infectives (From admission, onward)   Start     Dose/Rate Route Frequency Ordered Stop   01/16/20 1500  meropenem (MERREM) 1 g in sodium chloride 0.9 % 100  mL IVPB     1  g 200 mL/hr over 30 Minutes Intravenous Every 12 hours 01/16/20 1349     01/14/20 1700  cefTRIAXone (ROCEPHIN) 1 g in sodium chloride 0.9 % 100 mL IVPB  Status:  Discontinued     1 g 200 mL/hr over 30 Minutes Intravenous Every 24 hours 01/14/20 1651 01/16/20 1330      Subjective:   Amanda [name] seen and examined today.  Patient does not feel like talking to me today.  Denies current chest pain or shortness of breath.  Continues to have nausea. Objective:   Vitals:   01/15/20 1944 01/16/20 0327 01/16/20 0500 01/16/20 1432  BP: (!) 113/55 (!) 122/55  118/60  Pulse: 81 84  92  Resp: 18 16  14   Temp: 98.4 F (36.9 C) 98.6 F (37 C)  97.7 F (36.5 C)  TempSrc: Oral Oral  Oral  SpO2: 100% 96%  96%  Weight:   95.6 kg     Intake/Output Summary (Last 24 hours) at 01/16/2020 1525 Last data filed at 01/16/2020 1300 Gross per 24 hour  Intake 971.82 ml  Output --  Net 971.82 ml   Filed Weights   01/15/20 0500 01/16/20 0500  Weight: 97 kg 95.6 kg    Exam  General: Well developed, chronically ill-appearing, NAD  HEENT: NCAT, mucous membranes moist.   Cardiovascular: S1 S2 auscultated, RRR, no murmur  Respiratory: Clear to auscultation bilaterally   Abdomen: Soft, nontender, nondistended, + bowel sounds  Extremities: warm dry without cyanosis clubbing or edema  Neuro: AAOx3, nonfocal  Psych: Flat  Data Reviewed: I have personally reviewed following labs and imaging studies  CBC: Recent Labs  Lab 01/13/20 0055 01/13/20 0407 01/14/20 0242 01/15/20 1622 01/16/20 0158  WBC 33.8* 30.3* 22.9* 21.7* 15.6*  NEUTROABS 29.7* 29.1* 20.6*  --  13.1*  HGB 13.0 11.8* 10.2* 10.7* 9.2*  HCT 41.3 37.7 32.2* 33.5* 29.3*  MCV 92.2 93.3 91.7 91.5 92.1  PLT 316 246 177 172 161   Basic Metabolic Panel: Recent Labs  Lab 01/13/20 0055 01/13/20 0355 01/14/20 0242 01/15/20 1622 01/16/20 0158  NA 133* 135 136 132* 133*  K 4.1 3.8 3.3* 3.6 3.0*  CL 97* 99 101 99 100  CO2 20* 22  25 21* 23  GLUCOSE 166* 132* 117* 148* 110*  BUN 8 9 8 10 8   CREATININE 1.05* 1.00 1.03* 1.06* 1.08*  CALCIUM 9.1 8.8* 8.5* 8.3* 8.2*  MG  --  1.6* 1.6*  --  2.0  PHOS  --  3.2 2.8  --  2.4*   GFR: Estimated Creatinine Clearance: 47.1 mL/min (A) (by C-G formula based on SCr of 1.08 mg/dL (H)). Liver Function Tests: Recent Labs  Lab 01/13/20 0055 01/13/20 0355 01/14/20 0242 01/16/20 0158  AST 31 32 18 19  ALT 11 11 8 8   ALKPHOS 95 87 78 75  BILITOT 2.6* 2.5* 1.7* 0.9  PROT 6.1* 5.9* 5.1* 4.7*  ALBUMIN 3.3* 3.1* 2.7* 2.5*   Recent Labs  Lab 01/13/20 0055  LIPASE 27   Recent Labs  Lab 01/13/20 0055 01/14/20 0830  AMMONIA 43* 31   Coagulation Profile: Recent Labs  Lab 01/13/20 0055  INR 1.5*   Cardiac Enzymes: No results for input(s): CKTOTAL, CKMB, CKMBINDEX, TROPONINI in the last 168 hours. BNP (last 3 results) No results for input(s): PROBNP in the last 8760 hours. HbA1C: No results for input(s): HGBA1C in the last 72 hours. CBG: Recent Labs  Lab 01/15/20 1946  01/15/20 2328 01/16/20 0328 01/16/20 0731 01/16/20 1201  GLUCAP 141* 108* 99 110* 163*   Lipid Profile: No results for input(s): CHOL, HDL, LDLCALC, TRIG, CHOLHDL, LDLDIRECT in the last 72 hours. Thyroid Function Tests: No results for input(s): TSH, T4TOTAL, FREET4, T3FREE, THYROIDAB in the last 72 hours. Anemia Panel: No results for input(s): VITAMINB12, FOLATE, FERRITIN, TIBC, IRON, RETICCTPCT in the last 72 hours. Urine analysis:    Component Value Date/Time   COLORURINE YELLOW 01/14/2020 0821   APPEARANCEUR CLEAR 01/14/2020 0821   LABSPEC <1.005 (L) 01/14/2020 0821   PHURINE 5.0 01/14/2020 0821   GLUCOSEU NEGATIVE 01/14/2020 0821   GLUCOSEU NEGATIVE 01/10/2018 1217   HGBUR SMALL (A) 01/14/2020 0821   HGBUR large 05/07/2010 0845   BILIRUBINUR MODERATE (A) 01/14/2020 0821   BILIRUBINUR neg 01/26/2016 1003   KETONESUR NEGATIVE 01/14/2020 0821   PROTEINUR NEGATIVE 01/14/2020 0821    UROBILINOGEN 0.2 01/10/2018 1217   NITRITE POSITIVE (A) 01/14/2020 0821   LEUKOCYTESUR NEGATIVE 01/14/2020 0821   Sepsis Labs: @LABRCNTIP (procalcitonin:4,lacticidven:4)  ) Recent Results (from the past 240 hour(s))  SARS CORONAVIRUS 2 (TAT 6-24 HRS) Nasopharyngeal Nasopharyngeal Swab     Status: None   Collection Time: 01/13/20  4:00 AM   Specimen: Nasopharyngeal Swab  Result Value Ref Range Status   SARS Coronavirus 2 NEGATIVE NEGATIVE Final    Comment: (NOTE) SARS-CoV-2 target nucleic acids are NOT DETECTED. The SARS-CoV-2 RNA is generally detectable in upper and lower respiratory specimens during the acute phase of infection. Negative results do not preclude SARS-CoV-2 infection, do not rule out co-infections with other pathogens, and should not be used as the sole basis for treatment or other patient management decisions. Negative results must be combined with clinical observations, patient history, and epidemiological information. The expected result is Negative. Fact Sheet for Patients: SugarRoll.be Fact Sheet for Healthcare Providers: https://www.woods-mathews.com/ This test is not yet approved or cleared by the [place] FDA and  has been authorized for detection and/or diagnosis of SARS-CoV-2 by FDA under an Emergency Use Authorization (EUA). This EUA will remain  in effect (meaning this test can be used) for the duration of the COVID-19 declaration under Section 56 4(b)(1) of the Act, 21 U.S.C. section 360bbb-3(b)(1), unless the authorization is terminated or revoked sooner. Performed at [facility], [place] [address] [place], [place] [address]   Culture, Urine     Status: Abnormal   Collection Time: 01/14/20  4:55 PM   Specimen: Urine, Random  Result Value Ref Range Status   Specimen Description URINE, RANDOM  Final   Special Requests   Final    NONE Performed at [facility], [place] [address],  [place], [place] [address]    Culture (A)  Final    >=100,000 COLONIES/mL ACINETOBACTER CALCOACETICUS/BAUMANNII COMPLEX >=100,000 COLONIES/mL ESCHERICHIA COLI    Report Status 01/16/2020 FINAL  Final   Organism ID, Bacteria ACINETOBACTER CALCOACETICUS/BAUMANNII COMPLEX (A)  Final   Organism ID, Bacteria ESCHERICHIA COLI (A)  Final      Susceptibility   Acinetobacter calcoaceticus/baumannii complex - MIC*    CEFTAZIDIME 4 SENSITIVE Sensitive     CIPROFLOXACIN <=0.25 SENSITIVE Sensitive     GENTAMICIN <=1 SENSITIVE Sensitive     IMIPENEM <=0.25 SENSITIVE Sensitive     PIP/TAZO <=4 SENSITIVE Sensitive     TRIMETH/SULFA <=20 SENSITIVE Sensitive     AMPICILLIN/SULBACTAM <=2 SENSITIVE Sensitive     * >=100,000 COLONIES/mL ACINETOBACTER CALCOACETICUS/BAUMANNII COMPLEX   Escherichia coli - MIC*  AMPICILLIN >=32 RESISTANT Resistant     CEFAZOLIN <=4 SENSITIVE Sensitive     CEFTRIAXONE <=0.25 SENSITIVE Sensitive     CIPROFLOXACIN >=4 RESISTANT Resistant     GENTAMICIN <=1 SENSITIVE Sensitive     IMIPENEM <=0.25 SENSITIVE Sensitive     NITROFURANTOIN <=16 SENSITIVE Sensitive     TRIMETH/SULFA >=320 RESISTANT Resistant     AMPICILLIN/SULBACTAM >=32 RESISTANT Resistant     PIP/TAZO <=4 SENSITIVE Sensitive     * >=100,000 COLONIES/mL ESCHERICHIA COLI      Radiology Studies: No results found.   Scheduled Meds: . apixaban  5 mg Oral BID  . dronabinol  2.5 mg Oral BID AC  . feeding supplement (ENSURE ENLIVE)  237 mL Oral BID BM  . insulin aspart  0-9 Units Subcutaneous Q4H  . lactulose  20 g Oral BID  . latanoprost  1 drop Both Eyes QHS  . mirtazapine  15 mg Oral QHS  . multivitamin with minerals  1 tablet Oral Daily  . pantoprazole  40 mg Oral Daily  . potassium chloride  40 mEq Oral Q4H  . timolol  1 drop Both Eyes Daily   Continuous Infusions: . meropenem (MERREM) IV 1 g (01/16/20 1450)     LOS: 2 days   Time Spent in minutes   45 minutes  Karalina Tift D.O. on 01/16/2020 at  3:25 PM  Between 7am to 7pm - Please see pager noted on [email or web address]  After 7pm go to [email or web address]  And look for the night coverage person covering for me after hours  [facility] Office  [phone]

## 2020-01-16 NOTE — Evaluation (Addendum)
Physical Therapy Evaluation Patient Details Name: Amanda [name] MRN: [id number] DOB: 06-19-1941 Today's Date: 01/16/2020   History of Present Illness  79 y.o. female  with past medical history of CAD (s/p stents), DM2, myeloproliferative disorder, decompensated liver cirrhosis, CKD, and portal vein thrombosis admitted on 01/13/2020 with nausea, vomiting, and anorexia. Patient had admission about 2 weeks ago for colitis. Found to have [name]. She was admitted for failure to thrive. PMT consulted to assist with [place].  Clinical Impression  PTA patient has been bedbound for the prior month following last hospital admission, being unable to transfer successfully with only husband for assistance. Prior to this she was able to perform transfers to [facility] utilizing RW and then transfer to commode and back to bed/recliner with assistance from husband. Required assistance with other ADLs and all IADLs.  Pt demonstrated severe core,UE, LE weakness (RLE weaker than LLE), reduced hip mobility, intolerance to upright activities, and reduced functional mobility with transfers. Pt required maxA for bed mobility today requiring RLE management, trunk management, and intermittent modA for upright posture EOB.  Pt unable to progress standing transfers today due to intolerance to upright positioning and levels of reported dizziness.  Due to patient intolerance to positional changes, educated on importance of using bed to increase upright posture throughout the day >60 degrees 3x a day for approximately 1 hour. Pt left with HOB elevated to 45 degrees and needs in reach.   Due to current inability to perform transfer successfully due to intolerance of sitting EOB patient will require 24/7 care and further skilled therapy at a SNF to return home successfully and safely. Needs for further DME to be determined at next venue of care. Pt currently with functional limitations due to the deficits listed below (see PT Problem List).  Pt will benefit from skilled PT to increase their independence and safety with mobility to allow discharge to the venue listed below.       Follow Up Recommendations SNF    Equipment Recommendations  Other (comment)(TBD at next venue of care)    Recommendations for Other Services OT consult;Speech consult     Precautions / Restrictions Precautions Precautions: Fall;Other (comment) Precaution Comments: CONTACT, Monitor for s/s of nausea, diarrhea/ vomiting. Restrictions Weight Bearing Restrictions: No      Mobility  Bed Mobility Overal bed mobility: Needs Assistance Bed Mobility: Sidelying to Sit;Supine to Sit;Sit to Supine Rolling: Mod assist Sidelying to sit: Mod assist;HOB elevated Supine to sit: Max assist;Mod assist;HOB elevated Sit to supine: Max assist;HOB elevated   General bed mobility comments: Pt able to tolerate 6 min EOB but reported dizziness upon initial sitting which increased throughout until she could no longer tolerate. Unable to progress to transfers safely today.  Transfers     General transfer comment: Unable to perform due to safety concerns.  Ambulation/Gait   General Gait Details: Unable to perform due to safety concerns.      Balance Overall balance assessment: Needs assistance Sitting-balance support: Bilateral upper extremity supported Sitting balance-Leahy Scale: Fair Sitting balance - Comments: Pt required multiple verbal cues and modA to sit upright. No OOB attempted due to patient dizziness reported which worsened as sitting continued Postural control: Posterior lean;Left lateral lean         Pertinent Vitals/Pain Pain Assessment: No/denies pain    Home Living Family/patient expects to be discharged to:: Skilled nursing facility Living Arrangements: Spouse/significant other  Additional Comments: Lives in apt, able to ambulate transfer with RW, WC, RW, commode prior to last visit.     Prior Function Level of  Independence: Needs assistance   Gait / Transfers Assistance Needed: Unable to perform PTA  ADL's / Homemaking Assistance Needed: Dependent        Hand Dominance   Dominant Hand: Right    Extremity/Trunk Assessment   Upper Extremity Assessment Upper Extremity Assessment: Defer to OT evaluation    Lower Extremity Assessment Lower Extremity Assessment: Generalized weakness       Communication   Communication: No difficulties  Cognition Arousal/Alertness: Awake/alert Behavior During Therapy: Anxious Overall Cognitive Status: Within Functional Limits for tasks assessed           General Comments General comments (skin integrity, edema, etc.): Pt demonstrates mild swelling in bilateral feet R worse than L. Husband stated that's normal as MD does not wish to overload kidneys.    Exercises Total Joint Exercises Ankle Circles/Pumps: AROM;Seated;10 reps Short Arc Quad: AROM;10 reps;Seated Other Exercises Other Exercises: Incentive spirometry - attempted pt unable to perform Other Exercises: Deep breathing x 10 attempts, unable to breath >1 sec inhale or exhale. Other Exercises: HOB left at 45 degrees for improved postural tolerance   Assessment/Plan    PT Assessment Patient needs continued PT services  PT Problem List Decreased strength;Decreased balance;Decreased knowledge of use of DME;Decreased knowledge of precautions;Decreased mobility;Decreased activity tolerance       PT Treatment Interventions Gait training;Functional mobility training;Therapeutic activities;Therapeutic exercise;Patient/family education;DME instruction;Balance training;Neuromuscular re-education    PT Goals (Current goals can be found in the Care Plan section)  Acute Rehab PT Goals Patient Stated Goal: Get stronger to eventually return home PT Goal Formulation: With patient Time For Goal Achievement: 01/30/20 Potential to Achieve Goals: Good    Frequency Min 3X/week   Barriers to  discharge Decreased caregiver support         AM-PAC PT "6 Clicks" Mobility  Outcome Measure Help needed turning from your back to your side while in a flat bed without using bedrails?: A Lot Help needed moving from lying on your back to sitting on the side of a flat bed without using bedrails?: A Lot Help needed moving to and from a bed to a chair (including a wheelchair)?: Total Help needed standing up from a chair using your arms (e.g., wheelchair or bedside chair)?: Total Help needed to walk in hospital room?: Total Help needed climbing 3-5 steps with a railing? : Total 6 Click Score: 8    End of Session   Activity Tolerance: Treatment limited secondary to medical complications (Comment);Patient limited by fatigue(Limited by dizziness with change in position) Patient left: in bed;with call bell/phone within reach;with bed alarm set Nurse Communication: Mobility status PT Visit Diagnosis: Muscle weakness (generalized) (M62.81);Difficulty in walking, not elsewhere classified (R26.2);Dizziness and giddiness (R42)    Time: 1010-1044     Charges:  ModEvaluation Therapeutic activities 8-22 min            Ann Held PT, DPT Acute Rehab [facility] Rehabilitation P: [phone]   Dayle Sherpa A Anaalicia Reimann 01/16/2020, 1:31 PM

## 2020-01-16 NOTE — TOC Progression Note (Signed)
Transition of Care [facility]) - Progression Note    Patient Details  Name: Amanda [name] MRN: [id number] Date of Birth: [birth year]  Transition of Care [facility] CM/SW [place], [place] Phone Number: 01/16/2020, 8:40 AM  Clinical Narrative:    Await therapy evaluations needed for insurance approval. Referral has been made to outpatient palliative per pt discussions on 3/30 with palliative medicine team here.   Referrals for SNF have been made but PT eval still pending.   Expected Discharge Plan: [place] Barriers to Discharge: Continued Medical Work up, Ship broker  Expected Discharge Plan and Services Expected Discharge Plan: [place] In-house Referral: Clinical Social Work Discharge Planning Services: CM Consult Post Acute Care Choice: [place] arrangements for the past 2 months: Single Family Home  Readmission Risk Interventions No flowsheet data found.

## 2020-01-17 LAB — BASIC METABOLIC PANEL
Anion gap: 12 (ref 5–15)
BUN: 11 mg/dL (ref 8–23)
CO2: 24 mmol/L (ref 22–32)
Calcium: 8.4 mg/dL — ABNORMAL LOW (ref 8.9–10.3)
Chloride: 100 mmol/L (ref 98–111)
Creatinine, Ser: 1.12 mg/dL — ABNORMAL HIGH (ref 0.44–1.00)
GFR calc Af Amer: 54 mL/min — ABNORMAL LOW (ref >60)
GFR calc non Af Amer: 46 mL/min — ABNORMAL LOW (ref >60)
Glucose, Bld: 160 mg/dL — ABNORMAL HIGH (ref 70–99)
Potassium: 3.5 mmol/L (ref 3.5–5.1)
Sodium: 136 mmol/L (ref 135–145)

## 2020-01-17 LAB — CBC
HCT: 31.3 % — ABNORMAL LOW (ref 36.0–46.0)
Hemoglobin: 9.8 g/dL — ABNORMAL LOW (ref 12.0–15.0)
MCH: 28.7 pg (ref 26.0–34.0)
MCHC: 31.3 g/dL (ref 30.0–36.0)
MCV: 91.8 fL (ref 80.0–100.0)
Platelets: 216 10*3/uL (ref 150–400)
RBC: 3.41 MIL/uL — ABNORMAL LOW (ref 3.87–5.11)
RDW: 20.2 % — ABNORMAL HIGH (ref 11.5–15.5)
WBC: 21.8 10*3/uL — ABNORMAL HIGH (ref 4.0–10.5)
nRBC: 0.7 % — ABNORMAL HIGH (ref 0.0–0.2)

## 2020-01-17 LAB — GLUCOSE, CAPILLARY
Glucose-Capillary: 152 mg/dL — ABNORMAL HIGH (ref 70–99)
Glucose-Capillary: 157 mg/dL — ABNORMAL HIGH (ref 70–99)
Glucose-Capillary: 173 mg/dL — ABNORMAL HIGH (ref 70–99)
Glucose-Capillary: 180 mg/dL — ABNORMAL HIGH (ref 70–99)
Glucose-Capillary: 188 mg/dL — ABNORMAL HIGH (ref 70–99)
Glucose-Capillary: 199 mg/dL — ABNORMAL HIGH (ref 70–99)

## 2020-01-17 LAB — MAGNESIUM: Magnesium: 1.9 mg/dL (ref 1.7–2.4)

## 2020-01-17 NOTE — Progress Notes (Signed)
Amanda [name] does look a lot better this morning.  She is more alert.  She says she is eating better.  She has not had any vomiting.  There is no obvious diarrhea.  There is no bleeding.  She is on Eliquis.  Her labs look better.  Her white cell count is 21.8.  Hemoglobin 9.8.  Platelet count 216,000.  Her blood sugar is 160.  Her BUN is 11 creatinine 1.12.  I am does happy that her performance status and overall quality of life seem to be doing a little bit better.  Hopefully, she really is eating better.  This will really determine her prognosis.  She is not on the [place].  I probably would just keep her off Jakafi for right now even when she goes home.  She does have a urinary tract infection with both E. coli and Acinetobacter.  This is being treated.  She is on Merrem.  I guess this covers both bacteria.  I think this is probably why she is feeling better.  Her vital signs all look pretty stable.  Her blood pressure is 149/67.  Temperature 99.1.  Again, I would just happy that she is feeling better.  Clearly, treating the urinary tract infection has made the difference for her.  Hopefully, she will be able to go home soon.  It is apparent that she is getting fantastic care from all the staff upon [address].  Lattie Haw, MD Amanda [name] 19:10

## 2020-01-17 NOTE — TOC Progression Note (Signed)
Transition of Care [facility]) - Progression Note    Patient Details  Name: Amanda [name] MRN: [id number] Date of Birth: December 21, 1940  Transition of Care [facility]) CM/SW [place], [place] Phone Number: 01/17/2020, 12:33 PM  Clinical Narrative:    Josem Kaufmann pending for d/c to [facility]. Pt needs new COVID swab.   Expected Discharge Plan: [place] Barriers to Discharge: Continued Medical Work up, Ship broker  Expected Discharge Plan and Services Expected Discharge Plan: [place] In-house Referral: Clinical Social Work Discharge Planning Services: CM Consult Post Acute Care Choice: [place] arrangements for the past 2 months: Single Family Home  Readmission Risk Interventions No flowsheet data found.

## 2020-01-17 NOTE — Progress Notes (Signed)
PROGRESS NOTE    Amanda [name]  DUK:[id number] DOB: 16-Dec-1940 DOA: 01/13/2020 PCP: [name], NP   Brief Narrative:  HPI On 01/13/2020 by Dr. Mitzi [place] Amanda [name] is a 79 y.o. 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  Since recent hospitalization, the patient has remained in bed most of the time, not eating or drinking much, and not taking her medications. Symptoms wax and wane, she is able to tolerate small bites and sips sometimes. She denies fever or chills. SNF had been recommended by PT and physicians prior to the recent discharge but patient wanted to go home and her husband thought he would be able to care for her there. She now wants to go a nursing home. She had a visit with her PCP 2 days ago and it was advised that she return to the hospital in order to be placed in a nursing facility.   Interim history Failure to thrive.  She essentially is bedbound but has ongoing nausea and loss of appetite.  Also has decompensated cirrhosis.  TOC consulted.  Palliative care consulted. Assessment & Plan   Failure to thrive/ Moderate malnutrition  -Patient presented from home where she essentially is bedbound with ongoing nausea and loss of appetite might have been replaced at a nursing facility -CT abdomen without acute abnormality.  Patient does have cirrhosis with portal hypertension, splenomegaly, small volume ascites, nonocclusive portal vein thrombosis.  Small left pleural effusion.  Hypodensity in the left urinary bladder, possible small bladder stone.  Abnormal endometrial thickening. -CT chest without acute pulmonary process.  Positive for pulmonary hypertension, findings of cirrhosis  with sequela of portal hypertension -Continue antiemetics as needed -Palliative care consulted and appreciated, continue supportive care -Continue Marinol for decreased appetite -Nutrition consulted, continue supplements  E. Coli/ Acinetobacter UTI -Initially was placed on ceftriaxone -Urine culture showed>100k E. coli and acinetobacter calcoaceticus/baumanii complex -Both sensitive to Zosyn however patient with penicillin allergy therefore placed on Merrem (starting on 3/31) -Discussed with Dr. Megan Salon, ID, via phone, given that patient was symptomatic, would continue to treat for 5 days total  Decompensated cirrhosis  -Continue PPI continue lactulose -Diuretic held given recent loss of appetite with nausea and vomiting -Continue to monitor LFTs, bilirubin, platelets and albumin  Diabetes mellitus, type II -Hemoglobin A1c was 5.1 earlier this month -Continue insulin sliding scale and CBG monitoring  Chronic kidney disease, stage IIIa -Creatinine appears to be at baseline  Myeloproliferative disorder -Patient with chronic leukocytosis -Procalcitonin 0.22, not clearly suggestive of infection -Will continue to treat UTI as above -Oncology, Dr. [name], consulted and appreciated, recommended holding jakafi  Portal vein thrombosis -Continue Eliquis  Coronary artery disease -No complaints of chest pain at this time -Continue Eliquis  Essential hypertension -Lasix and amlodipine on hold   Glaucoma -continue eyedrops  Hypokalemia/hypomagnesemia -Resolved with replacement, continue to monitor and replace as needed  DVT Prophylaxis Eliquis  Code Status: DNR  Family Communication: None at bedside  Disposition Plan: Admitted from home.  Pending SNF placement as family is unable to care for her at this time and patient has had poor oral intake.  Consultants Oncology Palliative care ID, Dr. Megan Salon, via phone  Procedures  None  Antibiotics   Anti-infectives (From  admission, onward)  Start     Dose/Rate Route Frequency Ordered Stop   01/16/20 1500  meropenem (MERREM) 1 g in sodium chloride 0.9 % 100 mL IVPB     1 g 200 mL/hr over 30 Minutes Intravenous Every 12 hours 01/16/20 1349     01/14/20 1700  cefTRIAXone (ROCEPHIN) 1 g in sodium chloride 0.9 % 100 mL IVPB  Status:  Discontinued     1 g 200 mL/hr over 30 Minutes Intravenous Every 24 hours 01/14/20 1651 01/16/20 1330      Subjective:   Amanda [name] seen and examined today.  She feels very tired this morning.  Denies current chest pain or shortness of breath, abdominal pain, nausea or vomiting, diarrhea or constipation, dizziness or headache.  Refuses to take any oral medications as she states that that makes her vomit. Objective:   Vitals:   01/16/20 0500 01/16/20 1432 01/16/20 2006 01/17/20 0342  BP:  118/60 138/64 (!) 149/67  Pulse:  92 100 (!) 105  Resp:  14 18 18   Temp:  97.7 F (36.5 C) 98.2 F (36.8 C) 99.1 F (37.3 C)  TempSrc:  Oral Oral Oral  SpO2:  96% 92% 93%  Weight: 95.6 kg       Intake/Output Summary (Last 24 hours) at 01/17/2020 1339 Last data filed at 01/17/2020 1300 Gross per 24 hour  Intake 552.2 ml  Output 300 ml  Net 252.2 ml   Filed Weights   01/15/20 0500 01/16/20 0500  Weight: 97 kg 95.6 kg    Exam (no change from 3/31)  General: Well developed, chronically ill-appearing, NAD  HEENT: NCAT, mucous membranes moist.   Cardiovascular: S1 S2 auscultated, RRR, no murmur  Respiratory: Clear to auscultation bilaterally   Abdomen: Soft, nontender, nondistended, + bowel sounds  Extremities: warm dry without cyanosis clubbing or edema  Neuro: AAOx3, nonfocal  Psych: Flat  Data Reviewed: I have personally reviewed following labs and imaging studies  CBC: Recent Labs  Lab 01/13/20 0055 01/13/20 0055 01/13/20 0407 01/14/20 0242 01/15/20 1622 01/16/20 0158 01/17/20 0454  WBC 33.8*   < > 30.3* 22.9* 21.7* 15.6* 21.8*  NEUTROABS 29.7*  --  29.1*  20.6*  --  13.1*  --   HGB 13.0   < > 11.8* 10.2* 10.7* 9.2* 9.8*  HCT 41.3   < > 37.7 32.2* 33.5* 29.3* 31.3*  MCV 92.2   < > 93.3 91.7 91.5 92.1 91.8  PLT 316   < > 246 177 172 154 216   < > = values in this interval not displayed.   Basic Metabolic Panel: Recent Labs  Lab 01/13/20 0355 01/14/20 0242 01/15/20 1622 01/16/20 0158 01/17/20 0454  NA 135 136 132* 133* 136  K 3.8 3.3* 3.6 3.0* 3.5  CL 99 101 99 100 100  CO2 22 25 21* 23 24  GLUCOSE 132* 117* 148* 110* 160*  BUN 9 8 10 8 11   CREATININE 1.00 1.03* 1.06* 1.08* 1.12*  CALCIUM 8.8* 8.5* 8.3* 8.2* 8.4*  MG 1.6* 1.6*  --  2.0 1.9  PHOS 3.2 2.8  --  2.4*  --    GFR: Estimated Creatinine Clearance: 45.4 mL/min (A) (by C-G formula based on SCr of 1.12 mg/dL (H)). Liver Function Tests: Recent Labs  Lab 01/13/20 0055 01/13/20 0355 01/14/20 0242 01/16/20 0158  AST 31 32 18 19  ALT 11 11 8 8   ALKPHOS 95 87 78 75  BILITOT 2.6* 2.5* 1.7* 0.9  PROT 6.1* 5.9* 5.1* 4.7*  ALBUMIN  3.3* 3.1* 2.7* 2.5*   Recent Labs  Lab 01/13/20 0055  LIPASE 27   Recent Labs  Lab 01/13/20 0055 01/14/20 0830  AMMONIA 43* 31   Coagulation Profile: Recent Labs  Lab 01/13/20 0055  INR 1.5*   Cardiac Enzymes: No results for input(s): CKTOTAL, CKMB, CKMBINDEX, TROPONINI in the last 168 hours. BNP (last 3 results) No results for input(s): PROBNP in the last 8760 hours. HbA1C: No results for input(s): HGBA1C in the last 72 hours. CBG: Recent Labs  Lab 01/16/20 2003 01/16/20 2348 01/17/20 0337 01/17/20 0809 01/17/20 1300  GLUCAP 226* 181* 152* 157* 199*   Lipid Profile: No results for input(s): CHOL, HDL, LDLCALC, TRIG, CHOLHDL, LDLDIRECT in the last 72 hours. Thyroid Function Tests: No results for input(s): TSH, T4TOTAL, FREET4, T3FREE, THYROIDAB in the last 72 hours. Anemia Panel: No results for input(s): VITAMINB12, FOLATE, FERRITIN, TIBC, IRON, RETICCTPCT in the last 72 hours. Urine analysis:    Component Value  Date/Time   COLORURINE YELLOW 01/14/2020 0821   APPEARANCEUR CLEAR 01/14/2020 0821   LABSPEC <1.005 (L) 01/14/2020 0821   PHURINE 5.0 01/14/2020 0821   GLUCOSEU NEGATIVE 01/14/2020 0821   GLUCOSEU NEGATIVE 01/10/2018 1217   HGBUR SMALL (A) 01/14/2020 0821   HGBUR large 05/07/2010 0845   BILIRUBINUR MODERATE (A) 01/14/2020 0821   BILIRUBINUR neg 01/26/2016 1003   KETONESUR NEGATIVE 01/14/2020 0821   PROTEINUR NEGATIVE 01/14/2020 0821   UROBILINOGEN 0.2 01/10/2018 1217   NITRITE POSITIVE (A) 01/14/2020 0821   LEUKOCYTESUR NEGATIVE 01/14/2020 0821   Sepsis Labs: @LABRCNTIP (procalcitonin:4,lacticidven:4)  ) Recent Results (from the past 240 hour(s))  SARS CORONAVIRUS 2 (TAT 6-24 HRS) Nasopharyngeal Nasopharyngeal Swab     Status: None   Collection Time: 01/13/20  4:00 AM   Specimen: Nasopharyngeal Swab  Result Value Ref Range Status   SARS Coronavirus 2 NEGATIVE NEGATIVE Final    Comment: (NOTE) SARS-CoV-2 target nucleic acids are NOT DETECTED. The SARS-CoV-2 RNA is generally detectable in upper and lower respiratory specimens during the acute phase of infection. Negative results do not preclude SARS-CoV-2 infection, do not rule out co-infections with other pathogens, and should not be used as the sole basis for treatment or other patient management decisions. Negative results must be combined with clinical observations, patient history, and epidemiological information. The expected result is Negative. Fact Sheet for Patients: SugarRoll.be Fact Sheet for Healthcare Providers: https://www.woods-mathews.com/ This test is not yet approved or cleared by the [place] FDA and  has been authorized for detection and/or diagnosis of SARS-CoV-2 by FDA under an Emergency Use Authorization (EUA). This EUA will remain  in effect (meaning this test can be used) for the duration of the COVID-19 declaration under Section 56 4(b)(1) of the Act, 21  U.S.C. section 360bbb-3(b)(1), unless the authorization is terminated or revoked sooner. Performed at [facility], [place] [address] [place], [place] [address]   Culture, Urine     Status: Abnormal   Collection Time: 01/14/20  4:55 PM   Specimen: Urine, Random  Result Value Ref Range Status   Specimen Description URINE, RANDOM  Final   Special Requests   Final    NONE Performed at [facility], [place] [address] [place], [place] [address]    Culture (A)  Final    >=100,000 COLONIES/mL ACINETOBACTER CALCOACETICUS/BAUMANNII COMPLEX >=100,000 COLONIES/mL ESCHERICHIA COLI    Report Status 01/16/2020 FINAL  Final   Organism ID, Bacteria ACINETOBACTER CALCOACETICUS/BAUMANNII COMPLEX (A)  Final   Organism ID, Bacteria ESCHERICHIA  COLI (A)  Final      Susceptibility   Acinetobacter calcoaceticus/baumannii complex - MIC*    CEFTAZIDIME 4 SENSITIVE Sensitive     CIPROFLOXACIN <=0.25 SENSITIVE Sensitive     GENTAMICIN <=1 SENSITIVE Sensitive     IMIPENEM <=0.25 SENSITIVE Sensitive     PIP/TAZO <=4 SENSITIVE Sensitive     TRIMETH/SULFA <=20 SENSITIVE Sensitive     AMPICILLIN/SULBACTAM <=2 SENSITIVE Sensitive     * >=100,000 COLONIES/mL ACINETOBACTER CALCOACETICUS/BAUMANNII COMPLEX   Escherichia coli - MIC*    AMPICILLIN >=32 RESISTANT Resistant     CEFAZOLIN <=4 SENSITIVE Sensitive     CEFTRIAXONE <=0.25 SENSITIVE Sensitive     CIPROFLOXACIN >=4 RESISTANT Resistant     GENTAMICIN <=1 SENSITIVE Sensitive     IMIPENEM <=0.25 SENSITIVE Sensitive     NITROFURANTOIN <=16 SENSITIVE Sensitive     TRIMETH/SULFA >=320 RESISTANT Resistant     AMPICILLIN/SULBACTAM >=32 RESISTANT Resistant     PIP/TAZO <=4 SENSITIVE Sensitive     * >=100,000 COLONIES/mL ESCHERICHIA COLI      Radiology Studies: No results found.   Scheduled Meds: . apixaban  5 mg Oral BID  . Chlorhexidine Gluconate Cloth  6 each Topical Daily  . Chlorhexidine Gluconate Cloth  6 each Topical Daily  . dronabinol   2.5 mg Oral BID AC  . feeding supplement (ENSURE ENLIVE)  237 mL Oral BID BM  . insulin aspart  0-9 Units Subcutaneous Q4H  . lactulose  20 g Oral BID  . latanoprost  1 drop Both Eyes QHS  . mirtazapine  15 mg Oral QHS  . multivitamin with minerals  1 tablet Oral Daily  . pantoprazole  40 mg Oral Daily  . timolol  1 drop Both Eyes Daily   Continuous Infusions: . meropenem (MERREM) IV 1 g (01/17/20 0954)     LOS: 3 days   Time Spent in minutes   45 minutes  Lamere Lightner D.O. on 01/17/2020 at 1:39 PM  Between 7am to 7pm - Please see pager noted on [email or web address]  After 7pm go to [email or web address]  And look for the night coverage person covering for me after hours  [facility] Office  [phone]

## 2020-01-18 LAB — GLUCOSE, CAPILLARY
Glucose-Capillary: 139 mg/dL — ABNORMAL HIGH (ref 70–99)
Glucose-Capillary: 152 mg/dL — ABNORMAL HIGH (ref 70–99)
Glucose-Capillary: 207 mg/dL — ABNORMAL HIGH (ref 70–99)
Glucose-Capillary: 183 mg/dL — ABNORMAL HIGH (ref 70–99)
Glucose-Capillary: 158 mg/dL — ABNORMAL HIGH (ref 70–99)

## 2020-01-18 MED ORDER — ACETAMINOPHEN 325 MG PO TABS
650.0000 mg | ORAL_TABLET | Freq: Four times a day (QID) | ORAL | Status: DC | PRN
  Administered 2020-01-18: 650 mg via ORAL
  Filled 2020-01-18 (×2): qty 2

## 2020-01-18 NOTE — TOC Progression Note (Signed)
Transition of Care [facility]) - Progression Note    Patient Details  Name: Amanda [name] MRN: [id number] Date of Birth: Jul 22, 1941  Transition of Care [facility]) CM/SW [place], [place] Phone Number: 01/18/2020, 12:21 PM  Clinical Narrative:    CSW received confirmation that pt has received authorization for SNF placement through [facility]. Pt needs new COVID when 24-48 hrs away from d/c. MD aware. Auth valid through 4/7.    Expected Discharge Plan: Skilled Nursing Facility Barriers to Discharge: Continued Medical Work up, Other (comment)(new COVID)  Expected Discharge Plan and Services Expected Discharge Plan: [place] In-house Referral: Clinical Social Work Discharge Planning Services: CM Consult Post Acute Care Choice: [place] arrangements for the past 2 months: Single Family Home   Readmission Risk Interventions Readmission Risk Prevention Plan 01/17/2020  Transportation Screening Complete  PCP or Specialist Appt within 3-5 Days Not Complete  Not Complete comments plan for SNF  HRI or Home Care Consult Complete  Social Work Consult for [place] Planning/Counseling Complete  Palliative Care Screening Complete  Medication Review Press photographer) Referral to Pharmacy  Some recent data might be hidden

## 2020-01-18 NOTE — Plan of Care (Signed)
  Problem: Education: Goal: Knowledge of General Education information will improve Description: Including pain rating scale, medication(s)/side effects and non-pharmacologic comfort measures Outcome: Progressing   Problem: Health Behavior/Discharge Planning: Goal: Ability to manage health-related needs will improve Outcome: Progressing   Problem: Clinical Measurements: Goal: Ability to maintain clinical measurements within normal limits will improve Outcome: Progressing Goal: Respiratory complications will improve Outcome: Progressing   Problem: Activity: Goal: Risk for activity intolerance will decrease Outcome: Progressing   Problem: Nutrition: Goal: Adequate nutrition will be maintained Outcome: Progressing   Problem: Elimination: Goal: Will not experience complications related to bowel motility Outcome: Progressing Goal: Will not experience complications related to urinary retention Outcome: Progressing   Problem: Pain Managment: Goal: General experience of comfort will improve Outcome: Progressing   Problem: Safety: Goal: Ability to remain free from injury will improve Outcome: Progressing   Problem: Skin Integrity: Goal: Risk for impaired skin integrity will decrease Outcome: Progressing   

## 2020-01-18 NOTE — Progress Notes (Signed)
PROGRESS NOTE    Amanda [name]  FWY:[id number] DOB: 1941/08/06 DOA: 01/13/2020 PCP: [name], NP   Brief Narrative:  HPI On 01/13/2020 by Dr. Mitzi Hansen Amanda [name] is a 79 y.o. 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  Since recent hospitalization, the patient has remained in bed most of the time, not eating or drinking much, and not taking her medications. Symptoms wax and wane, she is able to tolerate small bites and sips sometimes. She denies fever or chills. SNF had been recommended by PT and physicians prior to the recent discharge but patient wanted to go home and her husband thought he would be able to care for her there. She now wants to go a nursing home. She had a visit with her PCP 2 days ago and it was advised that she return to the hospital in order to be placed in a nursing facility.   Interim history Failure to thrive.  She essentially is bedbound but has ongoing nausea and loss of appetite.  Also has decompensated cirrhosis.  TOC consulted.  Palliative care consulted. Currently has UTI, being treated with Merrem. Assessment & Plan   Failure to thrive/ Moderate malnutrition  -Patient presented from home where she essentially is bedbound with ongoing nausea and loss of appetite might have been replaced at a nursing facility -CT abdomen without acute abnormality.  Patient does have cirrhosis with portal hypertension, splenomegaly, small volume ascites, nonocclusive portal vein thrombosis.  Small left pleural effusion.  Hypodensity in the left urinary bladder, possible small bladder stone.  Abnormal endometrial thickening. -CT chest without acute pulmonary process.  Positive for  pulmonary hypertension, findings of cirrhosis with sequela of portal hypertension -Continue antiemetics as needed -Palliative care consulted and appreciated, continue supportive care -Continue Marinol for decreased appetite -Nutrition consulted, continue supplements  E. Coli/ Acinetobacter UTI -Initially was placed on ceftriaxone -Urine culture showed>100k E. coli and acinetobacter calcoaceticus/baumanii complex -Both sensitive to Zosyn however patient with penicillin allergy therefore placed on Merrem (starting on 3/31) -Discussed with Dr. Megan Salon, ID, via phone, given that patient was symptomatic, would continue to treat for 5 days total; today is day 3   Decompensated cirrhosis  -Continue PPI continue lactulose -Diuretic held given recent loss of appetite with nausea and vomiting -Continue to monitor LFTs, bilirubin, platelets and albumin  Diabetes mellitus, type II -Hemoglobin A1c was 5.1 earlier this month -Continue insulin sliding scale and CBG monitoring   Chronic kidney disease, stage IIIa -Creatinine appears to be at baseline  Myeloproliferative disorder -Patient with chronic leukocytosis -Procalcitonin 0.22, not clearly suggestive of infection -Will continue to treat UTI as above -Oncology, Dr. [name], consulted and appreciated, recommended holding jakafi  Portal vein thrombosis -Continue Eliquis  Coronary artery disease -No complaints of chest pain at this time -Continue Eliquis  Essential hypertension -Lasix and amlodipine on hold   Glaucoma -continue eyedrops  Hypokalemia/hypomagnesemia -Resolved with replacement, continue to monitor and replace as needed  Chronic normocytic anemia -Hemoglobin currently stable, continue monitor CBC  DVT Prophylaxis Eliquis  Code Status: DNR  Family Communication: None at bedside  Disposition Plan: Admitted from home.  Pending SNF placement as family is unable to care for her at this time and patient has had poor  oral intake.  Suspect will complete merrem therapy during hospitalization with discharge to SNF in 48-72 hours. Will also need repeat COVID test close to time of discharge.  Consultants Oncology Palliative care ID, Dr. Megan Salon, via phone  Procedures  None  Antibiotics   Anti-infectives (From admission, onward)   Start     Dose/Rate Route Frequency Ordered Stop   01/16/20 1500  meropenem (MERREM) 1 g in sodium chloride 0.9 % 100 mL IVPB     1 g 200 mL/hr over 30 Minutes Intravenous Every 12 hours 01/16/20 1349     01/14/20 1700  cefTRIAXone (ROCEPHIN) 1 g in sodium chloride 0.9 % 100 mL IVPB  Status:  Discontinued     1 g 200 mL/hr over 30 Minutes Intravenous Every 24 hours 01/14/20 1651 01/16/20 1330      Subjective:   Bentley Haralson seen and examined today.  Feels very tired this morning and does not wish to be bothered.  Denies current chest pain or shortness of breath, abdominal pain, nausea or vomiting, diarrhea constipation, dizziness or headache.  States she will try to eat some today.   Objective:   Vitals:   01/17/20 0342 01/17/20 1455 01/17/20 2037 01/18/20 0352  BP: (!) 149/67 (!) 155/75 (!) 151/79 (!) 130/57  Pulse: (!) 105 (!) 104 (!) 109 (!) 109  Resp: 18 18 18 18   Temp: 99.1 F (37.3 C) 98.6 F (37 C) 99.9 F (37.7 C) 97.7 F (36.5 C)  TempSrc: Oral Oral Oral Oral  SpO2: 93% 93% 94% 91%  Weight:    98.1 kg    Intake/Output Summary (Last 24 hours) at 01/18/2020 1231 Last data filed at 01/18/2020 0915 Gross per 24 hour  Intake 1090 ml  Output 500 ml  Net 590 ml   Filed Weights   01/15/20 0500 01/16/20 0500 01/18/20 0352  Weight: 97 kg 95.6 kg 98.1 kg   Exam  General: Well developed, chronically ill-appearing, NAD  HEENT: NCAT, mucous membranes moist.   Cardiovascular: S1 S2 auscultated, RRR, no murmur  Respiratory: Clear to auscultation bilaterally   Abdomen: Soft, nontender, nondistended, + bowel sounds  Extremities: warm dry without cyanosis  clubbing or edema  Neuro: AAOx3, nonfocal  Psych: Flat  Data Reviewed: I have personally reviewed following labs and imaging studies  CBC: Recent Labs  Lab 01/13/20 0055 01/13/20 0055 01/13/20 0407 01/14/20 0242 01/15/20 1622 01/16/20 0158 01/17/20 0454  WBC 33.8*   < > 30.3* 22.9* 21.7* 15.6* 21.8*  NEUTROABS 29.7*  --  29.1* 20.6*  --  13.1*  --   HGB 13.0   < > 11.8* 10.2* 10.7* 9.2* 9.8*  HCT 41.3   < > 37.7 32.2* 33.5* 29.3* 31.3*  MCV 92.2   < > 93.3 91.7 91.5 92.1 91.8  PLT 316   < > 246 177 172 154 216   < > = values in this interval not displayed.   Basic Metabolic Panel: Recent Labs  Lab 01/13/20 0355 01/14/20 0242 01/15/20 1622 01/16/20 0158 01/17/20 0454  NA 135 136 132* 133* 136  K 3.8 3.3* 3.6 3.0* 3.5  CL 99 101 99 100 100  CO2 22 25 21* 23 24  GLUCOSE 132* 117* 148* 110* 160*  BUN 9 8 10 8 11   CREATININE 1.00 1.03* 1.06* 1.08* 1.12*  CALCIUM 8.8* 8.5* 8.3* 8.2* 8.4*  MG 1.6* 1.6*  --  2.0 1.9  PHOS 3.2 2.8  --  2.4*  --    GFR: Estimated Creatinine Clearance: 46 mL/min (  A) (by C-G formula based on SCr of 1.12 mg/dL (H)). Liver Function Tests: Recent Labs  Lab 01/13/20 0055 01/13/20 0355 01/14/20 0242 01/16/20 0158  AST 31 32 18 19  ALT 11 11 8 8   ALKPHOS 95 87 78 75  BILITOT 2.6* 2.5* 1.7* 0.9  PROT 6.1* 5.9* 5.1* 4.7*  ALBUMIN 3.3* 3.1* 2.7* 2.5*   Recent Labs  Lab 01/13/20 0055  LIPASE 27   Recent Labs  Lab 01/13/20 0055 01/14/20 0830  AMMONIA 43* 31   Coagulation Profile: Recent Labs  Lab 01/13/20 0055  INR 1.5*   Cardiac Enzymes: No results for input(s): CKTOTAL, CKMB, CKMBINDEX, TROPONINI in the last 168 hours. BNP (last 3 results) No results for input(s): PROBNP in the last 8760 hours. HbA1C: No results for input(s): HGBA1C in the last 72 hours. CBG: Recent Labs  Lab 01/17/20 1723 01/17/20 2034 01/17/20 2341 01/18/20 0350 01/18/20 0806  GLUCAP 180* 188* 173* 139* 152*   Lipid Profile: No results for  input(s): CHOL, HDL, LDLCALC, TRIG, CHOLHDL, LDLDIRECT in the last 72 hours. Thyroid Function Tests: No results for input(s): TSH, T4TOTAL, FREET4, T3FREE, THYROIDAB in the last 72 hours. Anemia Panel: No results for input(s): VITAMINB12, FOLATE, FERRITIN, TIBC, IRON, RETICCTPCT in the last 72 hours. Urine analysis:    Component Value Date/Time   COLORURINE YELLOW 01/14/2020 0821   APPEARANCEUR CLEAR 01/14/2020 0821   LABSPEC <1.005 (L) 01/14/2020 0821   PHURINE 5.0 01/14/2020 0821   GLUCOSEU NEGATIVE 01/14/2020 0821   GLUCOSEU NEGATIVE 01/10/2018 1217   HGBUR SMALL (A) 01/14/2020 0821   HGBUR large 05/07/2010 0845   BILIRUBINUR MODERATE (A) 01/14/2020 0821   BILIRUBINUR neg 01/26/2016 1003   KETONESUR NEGATIVE 01/14/2020 0821   PROTEINUR NEGATIVE 01/14/2020 0821   UROBILINOGEN 0.2 01/10/2018 1217   NITRITE POSITIVE (A) 01/14/2020 0821   LEUKOCYTESUR NEGATIVE 01/14/2020 0821   Sepsis Labs: @LABRCNTIP (procalcitonin:4,lacticidven:4)  ) Recent Results (from the past 240 hour(s))  SARS CORONAVIRUS 2 (TAT 6-24 HRS) Nasopharyngeal Nasopharyngeal Swab     Status: None   Collection Time: 01/13/20  4:00 AM   Specimen: Nasopharyngeal Swab  Result Value Ref Range Status   SARS Coronavirus 2 NEGATIVE NEGATIVE Final    Comment: (NOTE) SARS-CoV-2 target nucleic acids are NOT DETECTED. The SARS-CoV-2 RNA is generally detectable in upper and lower respiratory specimens during the acute phase of infection. Negative results do not preclude SARS-CoV-2 infection, do not rule out co-infections with other pathogens, and should not be used as the sole basis for treatment or other patient management decisions. Negative results must be combined with clinical observations, patient history, and epidemiological information. The expected result is Negative. Fact Sheet for Patients: SugarRoll.be Fact Sheet for Healthcare  Providers: https://www.woods-mathews.com/ This test is not yet approved or cleared by the [place] FDA and  has been authorized for detection and/or diagnosis of SARS-CoV-2 by FDA under an Emergency Use Authorization (EUA). This EUA will remain  in effect (meaning this test can be used) for the duration of the COVID-19 declaration under Section 56 4(b)(1) of the Act, 21 U.S.C. section 360bbb-3(b)(1), unless the authorization is terminated or revoked sooner. Performed at [facility], [place] [address], [place], [place] [address]   Culture, Urine     Status: Abnormal   Collection Time: 01/14/20  4:55 PM   Specimen: Urine, Random  Result Value Ref Range Status   Specimen Description URINE, RANDOM  Final   Special Requests   Final    NONE  Performed at [facility], [place] [address] [place], [place] [address]    Culture (A)  Final    >=100,000 COLONIES/mL ACINETOBACTER CALCOACETICUS/BAUMANNII COMPLEX >=100,000 COLONIES/mL ESCHERICHIA COLI    Report Status 01/16/2020 FINAL  Final   Organism ID, Bacteria ACINETOBACTER CALCOACETICUS/BAUMANNII COMPLEX (A)  Final   Organism ID, Bacteria ESCHERICHIA COLI (A)  Final      Susceptibility   Acinetobacter calcoaceticus/baumannii complex - MIC*    CEFTAZIDIME 4 SENSITIVE Sensitive     CIPROFLOXACIN <=0.25 SENSITIVE Sensitive     GENTAMICIN <=1 SENSITIVE Sensitive     IMIPENEM <=0.25 SENSITIVE Sensitive     PIP/TAZO <=4 SENSITIVE Sensitive     TRIMETH/SULFA <=20 SENSITIVE Sensitive     AMPICILLIN/SULBACTAM <=2 SENSITIVE Sensitive     * >=100,000 COLONIES/mL ACINETOBACTER CALCOACETICUS/BAUMANNII COMPLEX   Escherichia coli - MIC*    AMPICILLIN >=32 RESISTANT Resistant     CEFAZOLIN <=4 SENSITIVE Sensitive     CEFTRIAXONE <=0.25 SENSITIVE Sensitive     CIPROFLOXACIN >=4 RESISTANT Resistant     GENTAMICIN <=1 SENSITIVE Sensitive     IMIPENEM <=0.25 SENSITIVE Sensitive     NITROFURANTOIN <=16 SENSITIVE Sensitive      TRIMETH/SULFA >=320 RESISTANT Resistant     AMPICILLIN/SULBACTAM >=32 RESISTANT Resistant     PIP/TAZO <=4 SENSITIVE Sensitive     * >=100,000 COLONIES/mL ESCHERICHIA COLI      Radiology Studies: No results found.   Scheduled Meds: . apixaban  5 mg Oral BID  . Chlorhexidine Gluconate Cloth  6 each Topical Daily  . Chlorhexidine Gluconate Cloth  6 each Topical Daily  . dronabinol  2.5 mg Oral BID AC  . feeding supplement (ENSURE ENLIVE)  237 mL Oral BID BM  . insulin aspart  0-9 Units Subcutaneous Q4H  . lactulose  20 g Oral BID  . latanoprost  1 drop Both Eyes QHS  . mirtazapine  15 mg Oral QHS  . multivitamin with minerals  1 tablet Oral Daily  . pantoprazole  40 mg Oral Daily  . timolol  1 drop Both Eyes Daily   Continuous Infusions: . meropenem (MERREM) IV 1 g (01/18/20 0932)     LOS: 4 days   Time Spent in minutes   30 minutes  Bonham Zingale D.O. on 01/18/2020 at 12:31 PM  Between 7am to 7pm - Please see pager noted on [email or web address]  After 7pm go to [email or web address]  And look for the night coverage person covering for me after hours  [facility] Office  [phone]

## 2020-01-18 NOTE — Progress Notes (Signed)
Physical Therapy Treatment Patient Details Name: CESIAH WESTLEY MRN: [id number] DOB: [birth year] Today's Date: 01/18/2020    History of Present Illness 79 y.o. female  with past medical history of CAD (s/p stents), DM2, myeloproliferative disorder, decompensated liver cirrhosis, CKD, and portal vein thrombosis admitted on 01/13/2020 with nausea, vomiting, and anorexia. Patient had admission about 2 weeks ago for colitis. Found to have [name]. She was admitted for failure to thrive. PMT consulted to assist with [place].    PT Comments    Pt demonstrates continued small improvements towards PT goals. Tx focused on attempted prolonged EOB sitting, TE education, and attempted transfer. Pt became very hostile when attempting to stand "you can't make me stand or do what I don't want to". Pt educated on importance of loading through bones, that we wouldn't attempt to go away from bed so that she could only sit back down and would be safe but she continued to refuse and said "put me back in bed now". She continues to report intermittent dizziness with upright posture reporting moderate dizziness today which improved during sitting for the 10 min sitting EOB. PT will continue following until transition to next level of care to address below listed deficits.   Follow Up Recommendations  SNF     Equipment Recommendations  Other (comment)    Recommendations for Other Services OT consult;Speech consult     Precautions / Restrictions Precautions Precautions: Fall;Other (comment) Precaution Comments: CONTACT, Monitor for s/s of nausea, diarrhea/ vomiting. Restrictions Weight Bearing Restrictions: No    Mobility  Bed Mobility Overal bed mobility: Needs Assistance Bed Mobility: Sidelying to Sit;Supine to Sit;Sit to Supine Rolling: Min assist Sidelying to sit: Mod assist;HOB elevated Supine to sit: Mod assist;HOB elevated Sit to supine: Max assist;HOB elevated Sit to sidelying: Mod assist     Transfers Overall transfer level: Needs assistance Equipment used: Rolling walker (2 wheeled) Transfers: Sit to/from Stand Sit to Stand: (would not attempt)     General transfer comment: Pt required additional encouragement to attempt Sit to stand, became hostile when in position to finally attempt, did not attempt today due to pt hostility.       Balance Overall balance assessment: Needs assistance Sitting-balance support: Bilateral upper extremity supported Sitting balance-Leahy Scale: Poor Sitting balance - Comments: Pt required multiple VCs and modA intermittently for sitting.  Postural control: Posterior lean;Left lateral lean          Cognition Arousal/Alertness: Awake/alert Behavior During Therapy: Anxious Overall Cognitive Status: Within Functional Limits for tasks assessed          General Comments: Pt continues to be slow to process information requiring multiple cues prior to and during activity. Very high fear of falling, became hostile when in position to attempt standing.      Exercises Total Joint Exercises Ankle Circles/Pumps: AROM;Seated;20 reps Short Arc Quad: AROM;10 reps;Seated    General Comments        Pertinent Vitals/Pain Pain Assessment: No/denies pain           PT Goals (current goals can now be found in the care plan section) Acute Rehab PT Goals Patient Stated Goal: Participate as much as I can PT Goal Formulation: With patient Time For Goal Achievement: 01/30/20 Potential to Achieve Goals: Good Progress towards PT goals: Progressing toward goals    Frequency    Min 3X/week      PT Plan Current plan remains appropriate       AM-PAC PT "6 Clicks" Mobility  Outcome Measure  Help needed turning from your back to your side while in a flat bed without using bedrails?: A Lot Help needed moving from lying on your back to sitting on the side of a flat bed without using bedrails?: A Lot Help needed moving to and from a  bed to a chair (including a wheelchair)?: Total Help needed standing up from a chair using your arms (e.g., wheelchair or bedside chair)?: Total Help needed to walk in hospital room?: Total Help needed climbing 3-5 steps with a railing? : Total 6 Click Score: 8    End of Session Equipment Utilized During Treatment: Gait belt Activity Tolerance: Treatment limited secondary to medical complications (Comment);Patient limited by fatigue Patient left: in bed;with call bell/phone within reach;with bed alarm set Nurse Communication: Mobility status PT Visit Diagnosis: Muscle weakness (generalized) (M62.81);Difficulty in walking, not elsewhere classified (R26.2);Dizziness and giddiness (R42)     Time: 1188-6773 PT Time Calculation (min) (ACUTE ONLY): 27 min  Charges:  $Therapeutic Exercise: 8-22 mins $Therapeutic Activity: 8-22 mins                     Ann Held PT, DPT Acute Rehab [facility] P: [phone]    Azalee Weimer A Anglia Blakley 01/18/2020, 1:28 PM

## 2020-01-19 LAB — BASIC METABOLIC PANEL
Anion gap: 9 (ref 5–15)
BUN: 15 mg/dL (ref 8–23)
CO2: 25 mmol/L (ref 22–32)
Calcium: 8.3 mg/dL — ABNORMAL LOW (ref 8.9–10.3)
Chloride: 99 mmol/L (ref 98–111)
Creatinine, Ser: 0.9 mg/dL (ref 0.44–1.00)
GFR calc Af Amer: >60 mL/min (ref >60)
GFR calc non Af Amer: >60 mL/min (ref >60)
Glucose, Bld: 168 mg/dL — ABNORMAL HIGH (ref 70–99)
Potassium: 3.6 mmol/L (ref 3.5–5.1)
Sodium: 133 mmol/L — ABNORMAL LOW (ref 135–145)

## 2020-01-19 LAB — CBC
HCT: 27.8 % — ABNORMAL LOW (ref 36.0–46.0)
Hemoglobin: 8.8 g/dL — ABNORMAL LOW (ref 12.0–15.0)
MCH: 29.7 pg (ref 26.0–34.0)
MCHC: 31.7 g/dL (ref 30.0–36.0)
MCV: 93.9 fL (ref 80.0–100.0)
Platelets: 213 10*3/uL (ref 150–400)
RBC: 2.96 MIL/uL — ABNORMAL LOW (ref 3.87–5.11)
RDW: 20.8 % — ABNORMAL HIGH (ref 11.5–15.5)
WBC: 22.5 10*3/uL — ABNORMAL HIGH (ref 4.0–10.5)
nRBC: 2.2 % — ABNORMAL HIGH (ref 0.0–0.2)

## 2020-01-19 LAB — OCCULT BLOOD X 1 CARD TO LAB, STOOL: Fecal Occult Bld: POSITIVE — AB

## 2020-01-19 LAB — GLUCOSE, CAPILLARY
Glucose-Capillary: 126 mg/dL — ABNORMAL HIGH (ref 70–99)
Glucose-Capillary: 129 mg/dL — ABNORMAL HIGH (ref 70–99)
Glucose-Capillary: 141 mg/dL — ABNORMAL HIGH (ref 70–99)
Glucose-Capillary: 151 mg/dL — ABNORMAL HIGH (ref 70–99)
Glucose-Capillary: 156 mg/dL — ABNORMAL HIGH (ref 70–99)
Glucose-Capillary: 187 mg/dL — ABNORMAL HIGH (ref 70–99)
Glucose-Capillary: 188 mg/dL — ABNORMAL HIGH (ref 70–99)

## 2020-01-19 NOTE — Progress Notes (Signed)
Pharmacy Antibiotic Note  Amanda [name] is a 79 y.o. female admitted on 01/13/2020 with UTI.  Use of piperacillin/tazobactam (Zosyn) was considered but PCN is listed as an allergy and husband confirmed that PCN caused facial swelling.  Pharmacy has been consulted for meropenem dosing.  Urine culture reveals acinetobacter calcoaceticus/baumannii and E. Coli. ID recommended 5 day course. Today is day 4 abx. Pt is febrile, Tmax 100.8, WBC up 22.5 in the setting of chronic leukocytosis. Renal function improved, SCr down to 0.9, CrCl = 57 ml/min.  Plan: Increase meropenem to 1 gm q8h F/u stop date after tomorrow's dose   Weight: 98.1 kg (216 lb 4.3 oz)  Temp (24hrs), Avg:99.5 F (37.5 C), Min:98.4 F (36.9 C), Max:100.8 F (38.2 C)  Recent Labs  Lab 01/13/20 0055 01/13/20 0355 01/14/20 0242 01/15/20 1622 01/16/20 0158 01/17/20 0454 01/19/20 0352  WBC 33.8*   < > 22.9* 21.7* 15.6* 21.8* 22.5*  CREATININE 1.05*   < > 1.03* 1.06* 1.08* 1.12* 0.90  LATICACIDVEN 2.3*  --   --   --   --   --   --    < > = values in this interval not displayed.    Estimated Creatinine Clearance: 57.3 mL/min (by C-G formula based on SCr of 0.9 mg/dL).    Allergies  Allergen Reactions  . Doxycycline Hives, Swelling and Rash  . Penicillins Swelling    Has patient had a PCN reaction causing immediate rash, facial/tongue/throat swelling, SOB or lightheadedness with hypotension: No Has patient had a PCN reaction causing severe rash involving mucus membranes or skin necrosis: No Has patient had a PCN reaction that required hospitalization: No Has patient had a PCN reaction occurring within the last 10 years: No If all of the above answers are "NO", then may proceed with Cephalosporin use.  Marland Kitchen Lisinopril Cough  . Tape Hives  . Latex Hives, Itching and Rash    Antimicrobials this admission: CTX 3/29 >> 3/31 Meropenem 3/31 >>  Dose adjustments this admission: n/a  Microbiology results: 3/13 BCx:  NGTD 3/29 UCx: >=100,000 acinetobacyer calcoaceticus/baumannii complex and >=100,000 E. Coli   Thank you for allowing pharmacy to be a part of this patient's care.  Berenice Bouton, PharmD PGY1 Pharmacy Resident  Please check AMION for all [place] phone numbers After 10:00 PM, call [place] [phone]  01/19/2020 7:28 AM

## 2020-01-19 NOTE — TOC Progression Note (Signed)
Transition of Care [facility]) - Progression Note    Patient Details  Name: Amanda [name] MRN: [id number] Date of Birth: 11-Jan-1941  Transition of Care [facility] CM/SW [place], [place] Phone Number: 01/19/2020, 11:37 AM  Clinical Narrative:     Spoke with Dr. Ree Kida, plan for discharging on Sunday or Monday pending medical clearance.   Called Juliann Pulse with [facility]. She stated she will call the facility and give them a heads up. She asked to be notified when the patient is ready for discharge. Patient will need repeat negative COVID result before discharging.   CSW will continue to follow and assist with disposition planning.   Expected Discharge Plan: Skilled Nursing Facility Barriers to Discharge: Continued Medical Work up, Other (comment)(new COVID)  Expected Discharge Plan and Services Expected Discharge Plan: [place] In-house Referral: Clinical Social Work Discharge Planning Services: CM Consult Post Acute Care Choice: [place] Living arrangements for the past 2 months: Single Family Home                                       Social Determinants of Health (SDOH) Interventions    Readmission Risk Interventions Readmission Risk Prevention Plan 01/17/2020  Transportation Screening Complete  PCP or Specialist Appt within 3-5 Days Not Complete  Not Complete comments plan for SNF  HRI or [place] Complete  Social Work Consult for [place] Planning/Counseling Complete  Palliative Care Screening Complete  Medication Review Press photographer) Referral to Pharmacy  Some recent data might be hidden

## 2020-01-19 NOTE — Progress Notes (Signed)
PROGRESS NOTE    [name]  JGO:[id number] DOB: [birth year] DOA: 01/13/2020 PCP: [name], NP   Brief Narrative:  HPI On 01/13/2020 by Dr. Mitzi Hansen Amanda [name] is a 79 y.o. 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  Since recent hospitalization, the patient has remained in bed most of the time, not eating or drinking much, and not taking her medications. Symptoms wax and wane, she is able to tolerate small bites and sips sometimes. She denies fever or chills. SNF had been recommended by PT and physicians prior to the recent discharge but patient wanted to go home and her husband thought he would be able to care for her there. She now wants to go a nursing home. She had a visit with her PCP 2 days ago and it was advised that she return to the hospital in order to be placed in a nursing facility.   Interim history Failure to thrive.  She essentially is bedbound but has ongoing nausea and loss of appetite.  Also has decompensated cirrhosis.  TOC consulted.  Palliative care consulted. Currently has UTI, being treated with Merrem for a total of 5 days. Assessment & Plan   Failure to thrive/ Moderate malnutrition  -Patient presented from home where she essentially is bedbound with ongoing nausea and loss of appetite might have been replaced at a nursing facility -CT abdomen without acute abnormality.  Patient does have cirrhosis with portal hypertension, splenomegaly, small volume ascites, nonocclusive portal vein thrombosis.  Small left pleural effusion.  Hypodensity in the left urinary bladder, possible small bladder stone.  Abnormal endometrial thickening. -CT chest without acute pulmonary  process.  Positive for pulmonary hypertension, findings of cirrhosis with sequela of portal hypertension -Continue antiemetics as needed -Palliative care consulted and appreciated, continue supportive care -Continue Marinol for decreased appetite -Nutrition consulted, continue supplements  E. Coli/ Acinetobacter UTI -Initially was placed on ceftriaxone -Urine culture showed>100k E. coli and acinetobacter calcoaceticus/baumanii complex -Both sensitive to Zosyn however patient with penicillin allergy therefore placed on Merrem (starting on 3/31) -Discussed with Dr. Megan Salon, ID, via phone, given that patient was symptomatic, would continue to treat for 5 days total; today is day 4   Decompensated cirrhosis  -Continue PPI continue lactulose -Diuretic held given recent loss of appetite with nausea and vomiting -Continue to monitor LFTs, bilirubin, platelets and albumin  Diabetes mellitus, type II -Hemoglobin A1c was 5.1 earlier this month -Continue insulin sliding scale and CBG monitoring   Chronic kidney disease, stage IIIa -Creatinine appears to be at baseline  Myeloproliferative disorder -Patient with chronic leukocytosis -Procalcitonin 0.22, not clearly suggestive of infection -Will continue to treat UTI as above -Oncology, Dr. [name], consulted and appreciated, recommended holding jakafi  Portal vein thrombosis -Continue Eliquis  Coronary artery disease -No complaints of chest pain at this time -Continue Eliquis  Essential hypertension -Lasix and amlodipine on hold   Glaucoma -continue eyedrops  Hypokalemia/hypomagnesemia -Resolved with replacement, continue to monitor and replace as needed  Chronic normocytic anemia -Hemoglobin currently stable, continue monitor CBC  DVT Prophylaxis Eliquis  Code Status: DNR  Family Communication: None at bedside  Disposition Plan: Admitted from home.  Pending SNF placement as family is unable to care for her at this time and  patient has had poor oral intake. Suspect will complete merrem therapy during hospitalization with discharge to SNF in 48-72 hours. Will also need repeat COVID test close to time of discharge.  Consultants Oncology Palliative care ID, Dr. Megan Salon, via phone  Procedures  None  Antibiotics   Anti-infectives (From admission, onward)   Start     Dose/Rate Route Frequency Ordered Stop   01/16/20 1500  meropenem (MERREM) 1 g in sodium chloride 0.9 % 100 mL IVPB     1 g 200 mL/hr over 30 Minutes Intravenous Every 12 hours 01/16/20 1349     01/14/20 1700  cefTRIAXone (ROCEPHIN) 1 g in sodium chloride 0.9 % 100 mL IVPB  Status:  Discontinued     1 g 200 mL/hr over 30 Minutes Intravenous Every 24 hours 01/14/20 1651 01/16/20 1330      Subjective:   Amanda [name] seen and examined today.  Continues to feel tired and sleepy and does not want to be bothered.  Has no complaints this morning.     Objective:   Vitals:   01/18/20 2251 01/19/20 0057 01/19/20 0400 01/19/20 1006  BP: (!) 129/54 (!) 142/54 139/68 (!) 125/51  Pulse: (!) 101 100 99 93  Resp: 20 18 20 18   Temp: (!) 100.8 F (38.2 C) 99.3 F (37.4 C) 98.4 F (36.9 C) 97.7 F (36.5 C)  TempSrc: Oral Oral Oral Oral  SpO2: 92% 92% 91% 96%  Weight:        Intake/Output Summary (Last 24 hours) at 01/19/2020 1021 Last data filed at 01/19/2020 0400 Gross per 24 hour  Intake 960 ml  Output 600 ml  Net 360 ml   Filed Weights   01/15/20 0500 01/16/20 0500 01/18/20 0352  Weight: 97 kg 95.6 kg 98.1 kg   Exam  General: Well developed, chronically ill appearing, NAD  HEENT: NCAT, mucous membranes moist.   Cardiovascular: S1 S2 auscultated, RRR  Respiratory: Clear to auscultation bilaterally  Abdomen: Soft, nontender, nondistended, + bowel sounds  Extremities: warm dry without cyanosis clubbing or edema  Neuro: AAOx3, nonfocal  Psych: Flat   Data Reviewed: I have personally reviewed following labs and imaging  studies  CBC: Recent Labs  Lab 01/13/20 0055 01/13/20 0055 01/13/20 0407 01/13/20 0407 01/14/20 0242 01/15/20 1622 01/16/20 0158 01/17/20 0454 01/19/20 0352  WBC 33.8*   < > 30.3*   < > 22.9* 21.7* 15.6* 21.8* 22.5*  NEUTROABS 29.7*  --  29.1*  --  20.6*  --  13.1*  --   --   HGB 13.0   < > 11.8*   < > 10.2* 10.7* 9.2* 9.8* 8.8*  HCT 41.3   < > 37.7   < > 32.2* 33.5* 29.3* 31.3* 27.8*  MCV 92.2   < > 93.3   < > 91.7 91.5 92.1 91.8 93.9  PLT 316   < > 246   < > 177 172 154 216 213   < > = values in this interval not displayed.   Basic Metabolic Panel: Recent Labs  Lab 01/13/20 0355 01/13/20 0355 01/14/20 0242 01/15/20 1622 01/16/20 0158 01/17/20 0454 01/19/20 0352  NA 135   < > 136 132* 133* 136 133*  K 3.8   < > 3.3* 3.6 3.0* 3.5 3.6  CL 99   < > 101 99 100 100 99  CO2 22   < > 25 21* 23 24 25   GLUCOSE 132*   < > 117* 148* 110* 160* 168*  BUN 9   < >  8 10 8 11 15   CREATININE 1.00   < > 1.03* 1.06* 1.08* 1.12* 0.90  CALCIUM 8.8*   < > 8.5* 8.3* 8.2* 8.4* 8.3*  MG 1.6*  --  1.6*  --  2.0 1.9  --   PHOS 3.2  --  2.8  --  2.4*  --   --    < > = values in this interval not displayed.   GFR: Estimated Creatinine Clearance: 57.3 mL/min (by C-G formula based on SCr of 0.9 mg/dL). Liver Function Tests: Recent Labs  Lab 01/13/20 0055 01/13/20 0355 01/14/20 0242 01/16/20 0158  AST 31 32 18 19  ALT 11 11 8 8   ALKPHOS 95 87 78 75  BILITOT 2.6* 2.5* 1.7* 0.9  PROT 6.1* 5.9* 5.1* 4.7*  ALBUMIN 3.3* 3.1* 2.7* 2.5*   Recent Labs  Lab 01/13/20 0055  LIPASE 27   Recent Labs  Lab 01/13/20 0055 01/14/20 0830  AMMONIA 43* 31   Coagulation Profile: Recent Labs  Lab 01/13/20 0055  INR 1.5*   Cardiac Enzymes: No results for input(s): CKTOTAL, CKMB, CKMBINDEX, TROPONINI in the last 168 hours. BNP (last 3 results) No results for input(s): PROBNP in the last 8760 hours. HbA1C: No results for input(s): HGBA1C in the last 72 hours. CBG: Recent Labs  Lab  01/18/20 1702 01/18/20 2011 01/19/20 0013 01/19/20 0351 01/19/20 0740  GLUCAP 183* 158* 156* 151* 126*   Lipid Profile: No results for input(s): CHOL, HDL, LDLCALC, TRIG, CHOLHDL, LDLDIRECT in the last 72 hours. Thyroid Function Tests: No results for input(s): TSH, T4TOTAL, FREET4, T3FREE, THYROIDAB in the last 72 hours. Anemia Panel: No results for input(s): VITAMINB12, FOLATE, FERRITIN, TIBC, IRON, RETICCTPCT in the last 72 hours. Urine analysis:    Component Value Date/Time   COLORURINE YELLOW 01/14/2020 0821   APPEARANCEUR CLEAR 01/14/2020 0821   LABSPEC <1.005 (L) 01/14/2020 0821   PHURINE 5.0 01/14/2020 0821   GLUCOSEU NEGATIVE 01/14/2020 0821   GLUCOSEU NEGATIVE 01/10/2018 1217   HGBUR SMALL (A) 01/14/2020 0821   HGBUR large 05/07/2010 0845   BILIRUBINUR MODERATE (A) 01/14/2020 0821   BILIRUBINUR neg 01/26/2016 1003   KETONESUR NEGATIVE 01/14/2020 0821   PROTEINUR NEGATIVE 01/14/2020 0821   UROBILINOGEN 0.2 01/10/2018 1217   NITRITE POSITIVE (A) 01/14/2020 0821   LEUKOCYTESUR NEGATIVE 01/14/2020 0821   Sepsis Labs: @LABRCNTIP (procalcitonin:4,lacticidven:4)  ) Recent Results (from the past 240 hour(s))  SARS CORONAVIRUS 2 (TAT 6-24 HRS) Nasopharyngeal Nasopharyngeal Swab     Status: None   Collection Time: 01/13/20  4:00 AM   Specimen: Nasopharyngeal Swab  Result Value Ref Range Status   SARS Coronavirus 2 NEGATIVE NEGATIVE Final    Comment: (NOTE) SARS-CoV-2 target nucleic acids are NOT DETECTED. The SARS-CoV-2 RNA is generally detectable in upper and lower respiratory specimens during the acute phase of infection. Negative results do not preclude SARS-CoV-2 infection, do not rule out co-infections with other pathogens, and should not be used as the sole basis for treatment or other patient management decisions. Negative results must be combined with clinical observations, patient history, and epidemiological information. The expected result is  Negative. Fact Sheet for Patients: SugarRoll.be Fact Sheet for Healthcare Providers: https://www.woods-mathews.com/ This test is not yet approved or cleared by the [place] FDA and  has been authorized for detection and/or diagnosis of SARS-CoV-2 by FDA under an Emergency Use Authorization (EUA). This EUA will remain  in effect (meaning this test can be used) for the duration of the COVID-19 declaration under Section  56 4(b)(1) of the Act, 21 U.S.C. section 360bbb-3(b)(1), unless the authorization is terminated or revoked sooner. Performed at [facility], [place] [address], [place], [place] [address]   Culture, Urine     Status: Abnormal   Collection Time: 01/14/20  4:55 PM   Specimen: Urine, Random  Result Value Ref Range Status   Specimen Description URINE, RANDOM  Final   Special Requests   Final    NONE Performed at [facility], [place] [address] [place],  [address]    Culture (A)  Final    >=100,000 COLONIES/mL ACINETOBACTER CALCOACETICUS/BAUMANNII COMPLEX >=100,000 COLONIES/mL ESCHERICHIA COLI    Report Status 01/16/2020 FINAL  Final   Organism ID, Bacteria ACINETOBACTER CALCOACETICUS/BAUMANNII COMPLEX (A)  Final   Organism ID, Bacteria ESCHERICHIA COLI (A)  Final      Susceptibility   Acinetobacter calcoaceticus/baumannii complex - MIC*    CEFTAZIDIME 4 SENSITIVE Sensitive     CIPROFLOXACIN <=0.25 SENSITIVE Sensitive     GENTAMICIN <=1 SENSITIVE Sensitive     IMIPENEM <=0.25 SENSITIVE Sensitive     PIP/TAZO <=4 SENSITIVE Sensitive     TRIMETH/SULFA <=20 SENSITIVE Sensitive     AMPICILLIN/SULBACTAM <=2 SENSITIVE Sensitive     * >=100,000 COLONIES/mL ACINETOBACTER CALCOACETICUS/BAUMANNII COMPLEX   Escherichia coli - MIC*    AMPICILLIN >=32 RESISTANT Resistant     CEFAZOLIN <=4 SENSITIVE Sensitive     CEFTRIAXONE <=0.25 SENSITIVE Sensitive     CIPROFLOXACIN >=4 RESISTANT Resistant     GENTAMICIN <=1  SENSITIVE Sensitive     IMIPENEM <=0.25 SENSITIVE Sensitive     NITROFURANTOIN <=16 SENSITIVE Sensitive     TRIMETH/SULFA >=320 RESISTANT Resistant     AMPICILLIN/SULBACTAM >=32 RESISTANT Resistant     PIP/TAZO <=4 SENSITIVE Sensitive     * >=100,000 COLONIES/mL ESCHERICHIA COLI      Radiology Studies: No results found.   Scheduled Meds: . apixaban  5 mg Oral BID  . Chlorhexidine Gluconate Cloth  6 each Topical Daily  . Chlorhexidine Gluconate Cloth  6 each Topical Daily  . dronabinol  2.5 mg Oral BID AC  . feeding supplement (ENSURE ENLIVE)  237 mL Oral BID BM  . insulin aspart  0-9 Units Subcutaneous Q4H  . lactulose  20 g Oral BID  . latanoprost  1 drop Both Eyes QHS  . mirtazapine  15 mg Oral QHS  . multivitamin with minerals  1 tablet Oral Daily  . pantoprazole  40 mg Oral Daily  . timolol  1 drop Both Eyes Daily   Continuous Infusions: . meropenem (MERREM) IV 1 g (01/19/20 0939)     LOS: 5 days   Time Spent in minutes   30 minutes  Cailin Gebel D.O. on 01/19/2020 at 10:21 AM  Between 7am to 7pm - Please see pager noted on [email or web address]  After 7pm go to [email or web address]  And look for the night coverage person covering for me after hours  [facility] Office  [phone]

## 2020-01-20 DIAGNOSIS — R710 Precipitous drop in hematocrit: Secondary | ICD-10-CM

## 2020-01-20 DIAGNOSIS — R195 Other fecal abnormalities: Secondary | ICD-10-CM

## 2020-01-20 LAB — GLUCOSE, CAPILLARY
Glucose-Capillary: 148 mg/dL — ABNORMAL HIGH (ref 70–99)
Glucose-Capillary: 149 mg/dL — ABNORMAL HIGH (ref 70–99)
Glucose-Capillary: 162 mg/dL — ABNORMAL HIGH (ref 70–99)
Glucose-Capillary: 119 mg/dL — ABNORMAL HIGH (ref 70–99)
Glucose-Capillary: 109 mg/dL — ABNORMAL HIGH (ref 70–99)

## 2020-01-20 LAB — HEMOGLOBIN AND HEMATOCRIT, BLOOD
HCT: 27 % — ABNORMAL LOW (ref 36.0–46.0)
Hemoglobin: 8.4 g/dL — ABNORMAL LOW (ref 12.0–15.0)

## 2020-01-20 MED ORDER — INSULIN ASPART 100 UNIT/ML ~~LOC~~ SOLN
0.0000 [IU] | Freq: Three times a day (TID) | SUBCUTANEOUS | Status: DC
  Administered 2020-01-21 – 2020-01-22 (×3): 2 [IU] via SUBCUTANEOUS
  Administered 2020-01-22: 08:00:00 3 [IU] via SUBCUTANEOUS
  Administered 2020-01-23 – 2020-01-24 (×5): 2 [IU] via SUBCUTANEOUS
  Administered 2020-01-24: 13:00:00 3 [IU] via SUBCUTANEOUS
  Administered 2020-01-25 (×2): 2 [IU] via SUBCUTANEOUS

## 2020-01-20 MED ORDER — INSULIN ASPART 100 UNIT/ML ~~LOC~~ SOLN: 0.0000 [IU] | Freq: Every day | SUBCUTANEOUS | Status: DC

## 2020-01-20 MED ORDER — PANTOPRAZOLE SODIUM 40 MG IV SOLR
40.0000 mg | Freq: Two times a day (BID) | INTRAVENOUS | Status: DC
  Administered 2020-01-20 – 2020-01-25 (×10): 40 mg via INTRAVENOUS
  Filled 2020-01-20 (×10): qty 40

## 2020-01-20 NOTE — Plan of Care (Signed)
  Problem: Education: Goal: Knowledge of General Education information will improve Description: Including pain rating scale, medication(s)/side effects and non-pharmacologic comfort measures Outcome: Progressing   Problem: Clinical Measurements: Goal: Will remain free from infection Outcome: Progressing Goal: Diagnostic test results will improve Outcome: Progressing Goal: Respiratory complications will improve Outcome: Progressing   Problem: Activity: Goal: Risk for activity intolerance will decrease Outcome: Progressing   Problem: Nutrition: Goal: Adequate nutrition will be maintained Outcome: Progressing   Problem: Elimination: Goal: Will not experience complications related to bowel motility Outcome: Progressing

## 2020-01-20 NOTE — Consult Note (Addendum)
 Referring Provider:  [facility]         Primary Care Physician:  Nafziger, Cory, NP Primary Gastroenterologist:  Dan Jacobs, MD             We were asked to see this patient for:   Heme + stools  / cirrhosis             ASSESSMENT /  PLAN     79 yo female with PMH significant for obesity , cirrhosis, PVT, HTN, DM, CKD3, chronic blood loss of myeloproliferative disorder, cholelithiasis, history of C. 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 --Maybe some degree of decompensation with UTI.  Her bilirubin was mildly elevated on admission, now normal.  Only small amount of ascites on imaging.  Does not appear to be encephalopathic --Up-to-date on HCC screening --No esophageal or gastric varices on last EGD August 2019. --Continue lactulose twice daily --When tolerating solids recommend low sodium diet --Renal function has normalized, consider resuming home diuretics.  She was on Lasix but did not appear to be on Aldactone? --Avoid sedatives as much as possible  # Acute on chronic normocytic anemia / heme + stool --Baseline hgb 10-11, slowly declining and down to 8.4 now --Eliquis stopped today, last dose was ~ 10 pm last night --Husband says stools were dark, nearly black at home for couple weeks prior to admission --We discussed the EGD.  Initially patient reluctant. The risks and benefits of EGD were discussed and the patient agrees to proceed.  We will put her on the schedule for tomorrow. --Continue twice daily PPI --A.m. CBC --If EGD negative, not sure patient is candidate for further work-up  # Nausea / vomiting,  --Resolving on marinol  # Myeloproliferative disorder. --Evaluated by hematology this admission.  Condition appears to be stable without any progression or transformation.  --Advised against using Megace --Recommended Marinol  # UTI --on antibibiotics  #  Deconditioning / deconditioning. Requiring a lot of care at home.  --Going to SNF?     [facility] Attending   I have taken an interval history, reviewed the chart and examined the patient. I agree with the Advanced Practitioner's note, impression and recommendations.   Lots of reasons for anemia but heme + dark stools on Eliquis (stopped) and decreased Hgb warrants an EGD. Doubt would search further given co-morbidities and current condition. Tomorrow at 0730  Zebedee Segundo E. Zaylin Runco, MD, FACG [facility] 01/20/2020 2:43 PM   HPI:    Chief Complaint:   [place] A Potvin was admitted 01/13/2020 with failure to thrive.  She was hospitalized mid March with "pancolitis" with N/V and SIRS on presentation.  She may not have actually had colitis but findings may have been due to ascites /hypoalbuminemia but she was treated with antibiotics.  She was also treated for possible pneumonia with hypoxic respiratory failure.  SNF was recommended but patient wanted to return home.  She has required a lot of care while at home.  Patient is now admitted with failure to thrive . Per husband, patient had not eaten at home for weeks prior to this admission.  She has no appetite.  Denies abdominal pain.. She was having ongoing nausea and vomiting at home since discharge.  Her stools are typically loose on lactulose but they were dark, nearly black for a couple weeks prior to this admission per husband.  Nausea and vomiting have now   resolved and appetite improving, possibly due to starting Marinol.  Eliquis placed on hold today due to dark heme positive stool, declining hemoglobin.   Past Medical History:  Diagnosis Date  . Allergic rhinitis 01/23/2016  . C. difficile colitis   . CAD (coronary artery disease)    a. s/p STEMI in 01/2015 with 95% LCx stenosis and distal 80% LCx stenosis (DESx2 placed)  . Cancer (HCC)    SKIN  . Candidiasis of skin 09/30/2014  . Cirrhosis (HCC)   . Depression   . Diabetes  mellitus 2008  . Gout   . Herpes simplex   . Hyperglycemia 05/31/2013  . Hyperlipidemia   . Hypertension   . Myocardial infarction (HCC)   . Neuromuscular disorder (HCC)    BELL PALSY  . Obesity   . Polycythemia    Dr. Wiliform- [facility]  . Psoriasis     Past Surgical History:  Procedure Laterality Date  . ANKLE FRACTURE SURGERY    . BIOPSY  05/26/2018   Procedure: BIOPSY;  Surgeon: Gupta, Rajesh, MD;  Location: [facility] ENDOSCOPY;  Service: Endoscopy;;  . BREAST SURGERY Left    milk duct  . CATARACT EXTRACTION, BILATERAL    . COLONOSCOPY    . ESOPHAGOGASTRODUODENOSCOPY N/A 05/26/2018   Procedure: ESOPHAGOGASTRODUODENOSCOPY (EGD);  Surgeon: Gupta, Rajesh, MD;  Location: [facility] ENDOSCOPY;  Service: Endoscopy;  Laterality: N/A;  . IR IMAGING GUIDED PORT INSERTION  08/30/2019  . IR PARACENTESIS  05/25/2018  . LEFT HEART CATHETERIZATION WITH CORONARY ANGIOGRAM N/A 01/27/2015   Procedure: LEFT HEART CATHETERIZATION WITH CORONARY ANGIOGRAM;  Surgeon: Thomas A Kelly, MD;  Location: [facility] CATH LAB;  Service: Cardiovascular;  Laterality: N/A;  . TONSILLECTOMY    . TOTAL ABDOMINAL HYSTERECTOMY W/ BILATERAL SALPINGOOPHORECTOMY     for heavy periods with appendectomy    Prior to Admission medications   Medication Sig Start Date End Date Taking? Authorizing Provider  acetaminophen (TYLENOL) 325 MG tablet Take 2 tablets (650 mg total) by mouth every 6 (six) hours as needed for mild pain (or Fever >/= 101). 05/27/18  Yes Mathews, Elizabeth G, MD  amLODipine (NORVASC) 5 MG tablet Take 1 tablet (5 mg total) by mouth daily. 09/19/19 01/13/20 Yes Kelly, Thomas A, MD  apixaban (ELIQUIS) 5 MG TABS tablet Take 1 tablet (5 mg total) by mouth 2 (two) times daily. 01/10/20 04/09/20 Yes Nafziger, Cory, NP  diazepam (VALIUM) 5 MG tablet Valium 5 mg by mouth at bedtime as needed. Patient taking differently: Take 5 mg by mouth at bedtime as needed for anxiety.  08/24/19  Yes Ennever, Peter R, MD  Dulaglutide (TRULICITY) 1.5  MG/0.5ML SOPN Inject 1.5 pens into the skin every Friday.    Yes [provider]  ezetimibe (ZETIA) 10 MG tablet TAKE 1 TABLET(10 MG) BY MOUTH DAILY Patient taking differently: Take 10 mg by mouth daily.  11/07/19  Yes Kelly, Thomas A, MD  furosemide (LASIX) 40 MG tablet TAKE 1 TABLET(40 MG) BY MOUTH DAILY Patient taking differently: Take 40 mg by mouth daily.  10/16/19  Yes Kelly, Thomas A, MD  glucose blood (ACCU-CHEK GUIDE) test strip Use to check blood sugars 3 times daily 01/25/18  Yes Gherghe, Cristina, MD  Insulin Pen Needle (BD PEN NEEDLE NANO U/F) 32G X 4 MM MISC Use to inject insulin 2 times a day Patient taking differently: 90 Units daily.  09/07/19  Yes Gherghe, Cristina, MD  insulin regular human CONCENTRATED (HUMULIN R U-500 KWIKPEN) 500 UNIT/ML kwikpen Inject 90 Units into the   skin daily.   Yes [provider]  JAKAFI 25 MG tablet TAKE 1 TABLET (25 MG TOTAL) BY MOUTH 2 TIMES DAILY. Patient taking differently: Take 25 mg by mouth 2 (two) times daily.  09/05/19  Yes Ennever, Peter R, MD  lactulose (CHRONULAC) 10 GM/15ML solution Take 30 mLs (20 g total) by mouth daily. 01/03/20  Yes Pokhrel, Laxman, MD  latanoprost (XALATAN) 0.005 % ophthalmic solution Place 1 drop into both eyes at bedtime.  03/23/19  Yes [provider]  lidocaine-prilocaine (EMLA) cream Apply 1 application topically as needed. Please apply EMLA over the Port-A-Cath site 1 hour prior to coming to our office. Patient taking differently: Apply 1 application topically daily as needed (for port).  08/17/19  Yes Ennever, Peter R, MD  mirtazapine (REMERON) 15 MG tablet Take 1 tablet (15 mg total) by mouth at bedtime. 08/17/19  Yes Nafziger, Cory, NP  ONETOUCH VERIO test strip USE TO TEST BLOOD SUGAR THREE TIMES DAILY 05/01/18  Yes Gherghe, Cristina, MD  oxyCODONE-acetaminophen (PERCOCET/ROXICET) 5-325 MG tablet Take 1-2 tablets by mouth every 8 (eight) hours as needed for severe pain. 09/28/19  Yes  Nafziger, Cory, NP  pantoprazole (PROTONIX) 40 MG tablet Take 1 tablet (40 mg total) by mouth daily. 01/03/20  Yes Pokhrel, Laxman, MD  potassium chloride (KLOR-CON M10) 10 MEQ tablet Take 1 tablet (10 mEq total) by mouth 2 (two) times daily. Patient taking differently: Take 10 mEq by mouth daily.  10/10/19  Yes Jacobs, Daniel P, MD  promethazine (PHENERGAN) 25 MG tablet Take 0.5 tablets (12.5 mg total) by mouth every 6 (six) hours as needed for nausea or vomiting. 05/28/18  Yes Emokpae, Ejiroghene E, MD  timolol (TIMOPTIC) 0.5 % ophthalmic solution Place 1 drop into both eyes daily.  10/07/19  Yes [provider]  triamcinolone cream (KENALOG) 0.1 % Apply 1 application topically 2 (two) times daily. Patient taking differently: Apply 1 application topically 2 (two) times daily as needed (for rask skin).  12/11/19  Yes Nafziger, Cory, NP  Apixaban Starter Pack (ELIQUIS STARTER PACK) 5 MG TBPK Take as directed on package: start with two-5mg tablets twice daily for 7 days. On day 8, switch to one-5mg tablet twice daily. Patient not taking: Reported on 01/13/2020 01/02/20   Pokhrel, Laxman, MD  Blood Glucose Monitoring Suppl (ACCU-CHEK GUIDE) w/Device KIT 1 kit by Does not apply route as directed. 01/25/18   Gherghe, Cristina, MD    Current Facility-Administered Medications  Medication Dose Route Frequency Provider Last Rate Last Admin  . acetaminophen (TYLENOL) tablet 650 mg  650 mg Oral Q6H PRN Griffith, Kelly A, DO   650 mg at 01/18/20 2342  . Chlorhexidine Gluconate Cloth 2 % PADS 6 each  6 each Topical Daily Mikhail, Maryann, DO   6 each at 01/17/20 0948  . Chlorhexidine Gluconate Cloth 2 % PADS 6 each  6 each Topical Daily Mikhail, Maryann, DO   6 each at 01/20/20 0834  . diphenhydrAMINE (BENADRYL) 12.5 MG/5ML elixir 12.5 mg  12.5 mg Oral Q6H PRN Powell, A Caldwell Jr., MD   12.5 mg at 01/18/20 1355  . dronabinol (MARINOL) capsule 2.5 mg  2.5 mg Oral BID AC Ennever, Peter R, MD   2.5 mg at  01/19/20 1624  . feeding supplement (ENSURE ENLIVE) (ENSURE ENLIVE) liquid 237 mL  237 mL Oral BID BM Mikhail, Maryann, DO   237 mL at 01/20/20 0839  . insulin aspart (novoLOG) injection 0-9 Units  0-9 Units Subcutaneous Q4H Opyd, Timothy   S, MD   1 Units at 01/20/20 0835  . lactulose (CHRONULAC) 10 GM/15ML solution 20 g  20 g Oral BID Opyd, Ilene Qua, MD   20 g at 01/19/20 0938  . latanoprost (XALATAN) 0.005 % ophthalmic solution 1 drop  1 drop Both Eyes QHS Elodia Florence., MD   1 drop at 01/19/20 2149  . meropenem (MERREM) 1 g in sodium chloride 0.9 % 100 mL IVPB  1 g Intravenous Q12H Mikhail, Harmony, DO 200 mL/hr at 01/20/20 0834 1 g at 01/20/20 0834  . mirtazapine (REMERON) tablet 15 mg  15 mg Oral QHS Elodia Florence., MD   15 mg at 01/19/20 2143  . multivitamin with minerals tablet 1 tablet  1 tablet Oral Daily Elodia Florence., MD   1 tablet at 01/19/20 [phone]  . ondansetron (ZOFRAN) tablet 4 mg  4 mg Oral Q6H PRN Opyd, Ilene Qua, MD       Or  . ondansetron (ZOFRAN) injection 4 mg  4 mg Intravenous Q6H PRN Opyd, Ilene Qua, MD   4 mg at 01/14/20 1023  . pantoprazole (PROTONIX) injection 40 mg  40 mg Intravenous Q12H Mikhail, Maryann, DO      . sodium chloride flush (NS) 0.9 % injection 10-40 mL  10-40 mL Intracatheter PRN Mikhail, Velta Addison, DO      . timolol (TIMOPTIC) 0.5 % ophthalmic solution 1 drop  1 drop Both Eyes Daily Elodia Florence., MD   1 drop at 01/20/20 4431    Allergies as of 01/13/2020 - Review Complete 01/13/2020  Allergen Reaction Noted  . Penicillins Swelling 05/23/2018  . Lisinopril Cough 04/08/2015  . Tape Hives 01/28/2014  . Doxycycline Hives, Swelling, and Rash   . Latex Hives, Itching, and Rash 09/07/2011    Family History  Problem Relation Age of Onset  . Diabetes Mother   . Heart disease Mother        CAD  . Hyperlipidemia Mother   . Hypertension Mother   . Kidney disease Mother   . Kidney disease Father   . Breast cancer  Maternal Aunt   . Heart disease Maternal Grandmother   . Heart disease Other        maternal aunts and uncles  . Colon cancer Neg Hx   . Esophageal cancer Neg Hx     Social History   Socioeconomic History  . Marital status: Married    Spouse name: [name]  . Number of children: 2  . Years of education: 2 yr colle  . Highest education level: Not on file  Occupational History  . Occupation: housewife  Tobacco Use  . Smoking status: Former Smoker    Years: 48.00    Types: Cigarettes    Quit date: 10/18/1986    Years since quitting: 33.2  . Smokeless tobacco: Never Used  Substance and Sexual Activity  . Alcohol use: No    Alcohol/week: 0.0 standard drinks  . Drug use: No  . Sexual activity: Yes    Partners: Male    Birth control/protection: None  Other Topics Concern  . Not on file  Social History Narrative   2 caffeine drink per day.  No regular exercise.     Retired from the bank   2 children (Daughter and a son) son has morbid obesity   2 grandchildren   3 great grandchildren   Social Determinants of Radio broadcast assistant Strain:   . Difficulty of Paying Living Expenses:  Food Insecurity:   . Worried About Running Out of Food in the Last Year:   . Ran Out of Food in the Last Year:   Transportation Needs:   . Lack of Transportation (Medical):   . Lack of Transportation (Non-Medical):   Physical Activity:   . Days of Exercise per Week:   . Minutes of Exercise per Session:   Stress:   . Feeling of Stress :   Social Connections:   . Frequency of Communication with Friends and Family:   . Frequency of Social Gatherings with Friends and Family:   . Attends Religious Services:   . Active Member of Clubs or Organizations:   . Attends Club or Organization Meetings:   . Marital Status:   Intimate Partner Violence:   . Fear of Current or Ex-Partner:   . Emotionally Abused:   . Physically Abused:   . Sexually Abused:     Review of Systems: All systems  reviewed and negative except where noted in HPI.  Physical Exam: Vital signs in last 24 hours: Temp:  [97.9 F (36.6 C)-98.6 F (37 C)] 98.6 F (37 C) (04/04 0424) Pulse Rate:  [98-108] 108 (04/04 0424) Resp:  [17-18] 17 (04/04 0424) BP: (116-143)/(46-66) 143/60 (04/04 0424) SpO2:  [91 %-100 %] 93 % (04/04 0424) Last BM Date: 01/19/20 General:   Alert, obese female in NAD Psych:  cooperative. Anxious. Eyes:  Pupils equal, sclera clear, no icterus.   Conjunctiva pink. Ears:  Normal auditory acuity. Nose:  No deformity, discharge,  or lesions. Neck:  Supple; no masses Lungs:  Clear throughout to auscultation.   No wheezes, crackles, or rhonchi.  Heart:  Regular rate and rhythm;  no lower extremity edema Abdomen:  Soft, protuberant, nontender, BS active, no palp mass   Rectal:  Deferred  Msk:  Poor muscle strength . Neurologic:  Alert and  oriented x4;  grossly normal neurologically. Skin:  Intact without significant lesions or rashes.   Intake/Output from previous day: 04/03 0701 - 04/04 0700 In: 100 [IV Piggyback:100] Out: -  Intake/Output this shift: No intake/output data recorded.  Lab Results: Recent Labs    01/19/20 0352 01/20/20 0758  WBC 22.5*  --   HGB 8.8* 8.4*  HCT 27.8* 27.0*  PLT 213  --    BMET Recent Labs    01/19/20 0352  NA 133*  K 3.6  CL 99  CO2 25  GLUCOSE 168*  BUN 15  CREATININE 0.90  CALCIUM 8.3*   LFT No results for input(s): PROT, ALBUMIN, AST, ALT, ALKPHOS, BILITOT, BILIDIR, IBILI in the last 72 hours. PT/INR No results for input(s): LABPROT, INR in the last 72 hours. Hepatitis Panel No results for input(s): HEPBSAG, HCVAB, HEPAIGM, HEPBIGM in the last 72 hours.   . CBC Latest Ref Rng & Units 01/20/2020 01/19/2020 01/17/2020  WBC 4.0 - 10.5 K/uL - 22.5(H) 21.8(H)  Hemoglobin 12.0 - 15.0 g/dL 8.4(L) 8.8(L) 9.8(L)  Hematocrit 36.0 - 46.0 % 27.0(L) 27.8(L) 31.3(L)  Platelets 150 - 400 K/uL - 213 216    . CMP Latest Ref Rng &  Units 01/19/2020 01/17/2020 01/16/2020  Glucose 70 - 99 mg/dL 168(H) 160(H) 110(H)  BUN 8 - 23 mg/dL 15 11 8  Creatinine 0.44 - 1.00 mg/dL 0.90 1.12(H) 1.08(H)  Sodium 135 - 145 mmol/L 133(L) 136 133(L)  Potassium 3.5 - 5.1 mmol/L 3.6 3.5 3.0(L)  Chloride 98 - 111 mmol/L 99 100 100  CO2 22 - 32 mmol/L 25 24 23    Calcium 8.9 - 10.3 mg/dL 8.3(L) 8.4(L) 8.2(L)  Total Protein 6.5 - 8.1 g/dL - - 4.7(L)  Total Bilirubin 0.3 - 1.2 mg/dL - - 0.9  Alkaline Phos 38 - 126 U/L - - 75  AST 15 - 41 U/L - - 19  ALT 0 - 44 U/L - - 8   Studies/Results: No results found.  Principal Problem:   Failure to thrive in adult Active Problems:   Cirrhosis (HCC)   Type 2 diabetes mellitus with diabetic polyneuropathy (HCC)   CKD (chronic kidney disease), stage III   Portal vein thrombosis   Nausea & vomiting   Myeloproliferative disorder (HCC)   Goals of care, counseling/discussion   Palliative care by specialist   Malnutrition of moderate degree    Paula Guenther, NP-C @  01/20/2020, 10:28 AM     

## 2020-01-20 NOTE — Anesthesia Preprocedure Evaluation (Addendum)
Anesthesia Evaluation  Patient identified by MRN, date of birth, ID band Patient awake    Reviewed: Allergy & Precautions, NPO status , Patient's Chart, lab work & pertinent test results  Airway Mallampati: III  TM Distance: >3 FB Neck ROM: Full    Dental  (+) Edentulous Upper, Edentulous Lower   Pulmonary sleep apnea , former smoker,    Pulmonary exam normal breath sounds clear to auscultation       Cardiovascular hypertension, Pt. on medications + CAD, + Past MI, + Cardiac Stents and +CHF   Rhythm:Regular Rate:Tachycardia  ECG: ST, rate 100  ECHO: Left ventricle: The cavity size was normal. Wall thickness was  increased in a pattern of moderate LVH. Systolic function was normal. The estimated ejection fraction was in the range of 60% to 65%. Wall motion was normal; there were no regional wall motion abnormalities. Doppler parameters are consistent with abnormal left ventricular relaxation (grade 1 diastolic dysfunction). Doppler parameters are consistent with high  ventricular filling pressure.  Mitral valve: Calcified annulus. There was mild regurgitation.  Left atrium: The atrium was moderately dilated.    Neuro/Psych PSYCHIATRIC DISORDERS Anxiety Depression negative neurological ROS     GI/Hepatic negative GI ROS, (+) Cirrhosis       ,   Endo/Other  diabetes, Insulin Dependent  Renal/GU negative Renal ROS     Musculoskeletal Gout   Abdominal (+) + obese,   Peds  Hematology  (+) anemia , HLD   Anesthesia Other Findings  anemia, hemoccult positive stool  Reproductive/Obstetrics                            Anesthesia Physical Anesthesia Plan  ASA: III  Anesthesia Plan: MAC   Post-op Pain Management:    Induction: Intravenous  PONV Risk Score and Plan: 2 and Propofol infusion and Treatment may vary due to age or medical condition  Airway Management Planned: Nasal  Cannula  Additional Equipment:   Intra-op Plan:   Post-operative Plan:   Informed Consent: I have reviewed the patients History and Physical, chart, labs and discussed the procedure including the risks, benefits and alternatives for the proposed anesthesia with the patient or authorized representative who has indicated his/her understanding and acceptance.       Plan Discussed with: CRNA  Anesthesia Plan Comments:        Anesthesia Quick Evaluation

## 2020-01-20 NOTE — H&P (View-Only) (Signed)
Referring Provider:  [facility]         Primary Care Physician:  [name], NP Primary Gastroenterologist:  Oretha Caprice, MD             We were asked to see this patient for:   Heme + stools  / cirrhosis             ASSESSMENT /  PLAN     79 yo female with PMH significant for obesity , cirrhosis, PVT, HTN, DM, CKD3, chronic blood loss of myeloproliferative disorder, cholelithiasis, history of C. 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 --Maybe some degree of decompensation with UTI.  Her bilirubin was mildly elevated on admission, now normal.  Only small amount of ascites on imaging.  Does not appear to be encephalopathic --Up-to-date on Bee screening --No esophageal or gastric varices on last EGD August 2019. --Continue lactulose twice daily --When tolerating solids recommend low sodium diet --Renal function has normalized, consider resuming home diuretics.  She was on Lasix but did not appear to be on Aldactone? --Avoid sedatives as much as possible  # Acute on chronic normocytic anemia / heme + stool --Baseline hgb 10-11, slowly declining and down to 8.4 now --Eliquis stopped today, last dose was ~ 10 pm last night --Husband says stools were dark, nearly black at home for couple weeks prior to admission --We discussed the EGD.  Initially patient reluctant. The risks and benefits of EGD were discussed and the patient agrees to proceed.  We will put her on the schedule for tomorrow. --Continue twice daily PPI --A.m. CBC --If EGD negative, not sure patient is candidate for further work-up  # Nausea / vomiting,  --Resolving on marinol  # Myeloproliferative disorder. --Evaluated by hematology this admission.  Condition appears to be stable without any progression or transformation.  --Advised against using Megace --Recommended Marinol  # UTI --on antibibiotics  #  Deconditioning / deconditioning. Requiring a lot of care at home.  --Going to SNF?     [facility] Attending   I have taken an interval history, reviewed the chart and examined the patient. I agree with the Advanced Practitioner's note, impression and recommendations.   Lots of reasons for anemia but heme + dark stools on Eliquis (stopped) and decreased Hgb warrants an EGD. Doubt would search further given co-morbidities and current condition. Tomorrow at [place], MD, [facility] 01/20/2020 2:43 PM   HPI:    Chief Complaint:   KALIAH HADDAWAY was admitted 01/13/2020 with failure to thrive.  She was hospitalized mid March with "pancolitis" with N/V and SIRS on presentation.  She may not have actually had colitis but findings may have been due to ascites /hypoalbuminemia but she was treated with antibiotics.  She was also treated for possible pneumonia with hypoxic respiratory failure.  SNF was recommended but patient wanted to return home.  She has required a lot of care while at home.  Patient is now admitted with failure to thrive . Per husband, patient had not eaten at home for weeks prior to this admission.  She has no appetite.  Denies abdominal pain.Marland Kitchen She was having ongoing nausea and vomiting at home since discharge.  Her stools are typically loose on lactulose but they were dark, nearly black for a couple weeks prior to this admission per husband.  Nausea and vomiting have now  resolved and appetite improving, possibly due to starting Marinol.  Eliquis placed on hold today due to dark heme positive stool, declining hemoglobin.   Past Medical History:  Diagnosis Date  . Allergic rhinitis 01/23/2016  . C. difficile colitis   . CAD (coronary artery disease)    a. s/p STEMI in 01/2015 with 95% LCx stenosis and distal 80% LCx stenosis (DESx2 placed)  . Cancer ([place])    SKIN  . Candidiasis of skin 09/30/2014  . Cirrhosis ([place])   . Depression   . Diabetes  mellitus 2008  . Gout   . Herpes simplex   . Hyperglycemia 05/31/2013  . Hyperlipidemia   . Hypertension   . Myocardial infarction ([place])   . 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 Obesity   . Polycythemia    Dr. Elease Hashimoto- [facility]  . Psoriasis     Past Surgical History:  Procedure Laterality Date  . ANKLE FRACTURE SURGERY    . BIOPSY  05/26/2018   Procedure: BIOPSY;  Surgeon: [name], MD;  Location: [facility] ENDOSCOPY;  Service: Endoscopy;;  . BREAST SURGERY Left    milk duct  . CATARACT EXTRACTION, BILATERAL    . COLONOSCOPY    . ESOPHAGOGASTRODUODENOSCOPY N/A 05/26/2018   Procedure: ESOPHAGOGASTRODUODENOSCOPY (EGD);  Surgeon: [name], MD;  Location: [facility] ENDOSCOPY;  Service: Endoscopy;  Laterality: N/A;  . IR IMAGING GUIDED PORT INSERTION  08/30/2019  . IR PARACENTESIS  05/25/2018  . LEFT HEART CATHETERIZATION WITH CORONARY ANGIOGRAM N/A 01/27/2015   Procedure: LEFT HEART CATHETERIZATION WITH CORONARY ANGIOGRAM;  Surgeon: [name], MD;  Location: [facility] CATH LAB;  Service: Cardiovascular;  Laterality: N/A;  . TONSILLECTOMY    . TOTAL ABDOMINAL HYSTERECTOMY W/ BILATERAL SALPINGOOPHORECTOMY     for heavy periods with appendectomy    Prior to Admission medications   Medication Sig Start Date End Date Taking? Authorizing Provider  acetaminophen (TYLENOL) 325 MG tablet Take 2 tablets (650 mg total) by mouth every 6 (six) hours as needed for mild pain (or Fever >/= 101). 05/27/18  Yes Georgette Shell, MD  amLODipine (NORVASC) 5 MG tablet Take 1 tablet (5 mg total) by mouth daily. 09/19/19 01/13/20 Yes [name], MD  apixaban (ELIQUIS) 5 MG TABS tablet Take 1 tablet (5 mg total) by mouth 2 (two) times daily. 01/10/20 04/09/20 Yes Nafziger, Tommi Rumps, NP  diazepam (VALIUM) 5 MG tablet Valium 5 mg by mouth at bedtime as needed. Patient taking differently: Take 5 mg by mouth at bedtime as needed for anxiety.  08/24/19  Yes Volanda Napoleon, MD  Dulaglutide (TRULICITY) 1.5  BW/4.6KZ SOPN Inject 1.5 pens into the skin every Friday.    Yes [provider]  ezetimibe (ZETIA) 10 MG tablet TAKE 1 TABLET(10 MG) BY MOUTH DAILY Patient taking differently: Take 10 mg by mouth daily.  11/07/19  Yes [name], MD  furosemide (LASIX) 40 MG tablet TAKE 1 TABLET(40 MG) BY MOUTH DAILY Patient taking differently: Take 40 mg by mouth daily.  10/16/19  Yes [name], MD  glucose blood (ACCU-CHEK GUIDE) test strip Use to check blood sugars 3 times daily 01/25/18  Yes [name], MD  Insulin Pen Needle (BD PEN NEEDLE NANO U/F) 32G X 4 MM MISC Use to inject insulin 2 times a day Patient taking differently: 90 Units daily.  09/07/19  Yes [name], MD  insulin regular human CONCENTRATED (HUMULIN R U-500 KWIKPEN) 500 UNIT/ML kwikpen Inject 90 Units into the  skin daily.   Yes [provider]  JAKAFI 25 MG tablet TAKE 1 TABLET (25 MG TOTAL) BY MOUTH 2 TIMES DAILY. Patient taking differently: Take 25 mg by mouth 2 (two) times daily.  09/05/19  Yes Ennever, Rudell Cobb, MD  lactulose (CHRONULAC) 10 GM/15ML solution Take 30 mLs (20 g total) by mouth daily. 01/03/20  Yes Pokhrel, Laxman, MD  latanoprost (XALATAN) 0.005 % ophthalmic solution Place 1 drop into both eyes at bedtime.  03/23/19  Yes [provider]  lidocaine-prilocaine (EMLA) cream Apply 1 application topically as needed. Please apply EMLA over the Port-A-Cath site 1 hour prior to coming to our office. Patient taking differently: Apply 1 application topically daily as needed (for port).  08/17/19  Yes Volanda Napoleon, MD  mirtazapine (REMERON) 15 MG tablet Take 1 tablet (15 mg total) by mouth at bedtime. 08/17/19  Yes Nafziger, Tommi Rumps, NP  ONETOUCH VERIO test strip USE TO TEST BLOOD SUGAR THREE TIMES DAILY 05/01/18  Yes [name], MD  oxyCODONE-acetaminophen (PERCOCET/ROXICET) 5-325 MG tablet Take 1-2 tablets by mouth every 8 (eight) hours as needed for severe pain. 09/28/19  Yes  Nafziger, Tommi Rumps, NP  pantoprazole (PROTONIX) 40 MG tablet Take 1 tablet (40 mg total) by mouth daily. 01/03/20  Yes Pokhrel, Laxman, MD  potassium chloride (KLOR-CON M10) 10 MEQ tablet Take 1 tablet (10 mEq total) by mouth 2 (two) times daily. Patient taking differently: Take 10 mEq by mouth daily.  10/10/19  Yes Milus Banister, MD  promethazine (PHENERGAN) 25 MG tablet Take 0.5 tablets (12.5 mg total) by mouth every 6 (six) hours as needed for nausea or vomiting. 05/28/18  Yes Emokpae, Ejiroghene E, MD  timolol (TIMOPTIC) 0.5 % ophthalmic solution Place 1 drop into both eyes daily.  10/07/19  Yes [provider]  triamcinolone cream (KENALOG) 0.1 % Apply 1 application topically 2 (two) times daily. Patient taking differently: Apply 1 application topically 2 (two) times daily as needed (for rask skin).  12/11/19  Yes Nafziger, Tommi Rumps, NP  Apixaban Starter Pack (ELIQUIS STARTER PACK) 5 MG TBPK Take as directed on package: start with two-79m tablets twice daily for 7 days. On day 8, switch to one-541mtablet twice daily. Patient not taking: Reported on 01/13/2020 01/02/20   PoFlora LippsMD  Blood Glucose Monitoring Suppl (ACCU-CHEK GUIDE) w/Device KIT 1 kit by Does not apply route as directed. 01/25/18   GhPhilemon KingdomMD    Current Facility-Administered Medications  Medication Dose Route Frequency Provider Last Rate Last Admin  . acetaminophen (TYLENOL) tablet 650 mg  650 mg Oral Q6H PRN GrNicole Kindred, DO   650 mg at 01/18/20 2342  . Chlorhexidine Gluconate Cloth 2 % PADS 6 each  6 each Topical Daily MiCristal FordDO   6 each at 01/17/20 [phone]. Chlorhexidine Gluconate Cloth 2 % PADS 6 each  6 each Topical Daily MiCristal FordDO   6 each at 01/20/20 [phone]. diphenhydrAMINE (BENADRYL) 12.5 MG/5ML elixir 12.5 mg  12.5 mg Oral Q6H PRN PoElodia Florence MD   12.5 mg at 01/18/20 1355  . dronabinol (MARINOL) capsule 2.5 mg  2.5 mg Oral BID AC EnVolanda NapoleonMD   2.5 mg at  01/19/20 1624  . feeding supplement (ENSURE ENLIVE) (ENSURE ENLIVE) liquid 237 mL  237 mL Oral BID BM Mikhail, MaVelta AddisonDO   237 mL at 01/20/20 0839  . insulin aspart (novoLOG) injection 0-9 Units  0-9 Units Subcutaneous Q4H Opyd, TiChristia Reading  S, MD   1 Units at 01/20/20 0835  . lactulose (CHRONULAC) 10 GM/15ML solution 20 g  20 g Oral BID Opyd, Ilene Qua, MD   20 g at 01/19/20 0938  . latanoprost (XALATAN) 0.005 % ophthalmic solution 1 drop  1 drop Both Eyes QHS Elodia Florence., MD   1 drop at 01/19/20 2149  . meropenem (MERREM) 1 g in sodium chloride 0.9 % 100 mL IVPB  1 g Intravenous Q12H Mikhail, Harmony, DO 200 mL/hr at 01/20/20 0834 1 g at 01/20/20 0834  . mirtazapine (REMERON) tablet 15 mg  15 mg Oral QHS Elodia Florence., MD   15 mg at 01/19/20 2143  . multivitamin with minerals tablet 1 tablet  1 tablet Oral Daily Elodia Florence., MD   1 tablet at 01/19/20 [phone]  . ondansetron (ZOFRAN) tablet 4 mg  4 mg Oral Q6H PRN Opyd, Ilene Qua, MD       Or  . ondansetron (ZOFRAN) injection 4 mg  4 mg Intravenous Q6H PRN Opyd, Ilene Qua, MD   4 mg at 01/14/20 1023  . pantoprazole (PROTONIX) injection 40 mg  40 mg Intravenous Q12H Mikhail, Maryann, DO      . sodium chloride flush (NS) 0.9 % injection 10-40 mL  10-40 mL Intracatheter PRN Mikhail, Velta Addison, DO      . timolol (TIMOPTIC) 0.5 % ophthalmic solution 1 drop  1 drop Both Eyes Daily Elodia Florence., MD   1 drop at 01/20/20 4431    Allergies as of 01/13/2020 - Review Complete 01/13/2020  Allergen Reaction Noted  . Penicillins Swelling 05/23/2018  . Lisinopril Cough 04/08/2015  . Tape Hives 01/28/2014  . Doxycycline Hives, Swelling, and Rash   . Latex Hives, Itching, and Rash 09/07/2011    Family History  Problem Relation Age of Onset  . Diabetes Mother   . Heart disease Mother        CAD  . Hyperlipidemia Mother   . Hypertension Mother   . Kidney disease Mother   . Kidney disease Father   . Breast cancer  Maternal Aunt   . Heart disease Maternal Grandmother   . Heart disease Other        maternal aunts and uncles  . Colon cancer Neg Hx   . Esophageal cancer Neg Hx     Social History   Socioeconomic History  . Marital status: Married    Spouse name: [name]  . Number of children: 2  . Years of education: 2 yr colle  . Highest education level: Not on file  Occupational History  . Occupation: housewife  Tobacco Use  . Smoking status: Former Smoker    Years: 48.00    Types: Cigarettes    Quit date: 10/18/1986    Years since quitting: 33.2  . Smokeless tobacco: Never Used  Substance and Sexual Activity  . Alcohol use: No    Alcohol/week: 0.0 standard drinks  . Drug use: No  . Sexual activity: Yes    Partners: Male    Birth control/protection: None  Other Topics Concern  . Not on file  Social History Narrative   2 caffeine drink per day.  No regular exercise.     Retired from the bank   2 children (Daughter and a son) son has morbid obesity   2 grandchildren   3 great grandchildren   Social Determinants of Radio broadcast assistant Strain:   . Difficulty of Paying Living Expenses:  Food Insecurity:   . Worried About Charity fundraiser in the Last Year:   . Arboriculturist in the Last Year:   Transportation Needs:   . Film/video editor (Medical):   Marland Kitchen Lack of Transportation (Non-Medical):   Physical Activity:   . Days of Exercise per Week:   . Minutes of Exercise per Session:   Stress:   . Feeling of Stress :   Social Connections:   . Frequency of Communication with Friends and Family:   . Frequency of Social Gatherings with Friends and Family:   . Attends Religious Services:   . Active Member of Clubs or Organizations:   . Attends Archivist Meetings:   Marland Kitchen Marital Status:   Intimate Partner Violence:   . Fear of Current or Ex-Partner:   . Emotionally Abused:   Marland Kitchen Physically Abused:   . Sexually Abused:     Review of Systems: All systems  reviewed and negative except where noted in HPI.  Physical Exam: Vital signs in last 24 hours: Temp:  [97.9 F (36.6 C)-98.6 F (37 C)] 98.6 F (37 C) (04/04 0424) Pulse Rate:  [98-108] 108 (04/04 0424) Resp:  [17-18] 17 (04/04 0424) BP: (116-143)/(46-66) 143/60 (04/04 0424) SpO2:  [91 %-100 %] 93 % (04/04 0424) Last BM Date: 01/19/20 General:   Alert, obese female in NAD Psych:  cooperative. Anxious. Eyes:  Pupils equal, sclera clear, no icterus.   Conjunctiva pink. Ears:  Normal auditory acuity. Nose:  No deformity, discharge,  or lesions. Neck:  Supple; no masses Lungs:  Clear throughout to auscultation.   No wheezes, crackles, or rhonchi.  Heart:  Regular rate and rhythm;  no lower extremity edema Abdomen:  Soft, protuberant, nontender, BS active, no palp mass   Rectal:  Deferred  Msk:  Poor muscle strength . Neurologic:  Alert and  oriented x4;  grossly normal neurologically. Skin:  Intact without significant lesions or rashes.   Intake/Output from previous day: 04/03 0701 - 04/04 0700 In: 100 [IV Piggyback:100] Out: -  Intake/Output this shift: No intake/output data recorded.  Lab Results: Recent Labs    01/19/20 0352 01/20/20 0758  WBC 22.5*  --   HGB 8.8* 8.4*  HCT 27.8* 27.0*  PLT 213  --    BMET Recent Labs    01/19/20 0352  NA 133*  K 3.6  CL 99  CO2 25  GLUCOSE 168*  BUN 15  CREATININE 0.90  CALCIUM 8.3*   LFT No results for input(s): PROT, ALBUMIN, AST, ALT, ALKPHOS, BILITOT, BILIDIR, IBILI in the last 72 hours. PT/INR No results for input(s): LABPROT, INR in the last 72 hours. Hepatitis Panel No results for input(s): HEPBSAG, HCVAB, HEPAIGM, HEPBIGM in the last 72 hours.   . CBC Latest Ref Rng & Units 01/20/2020 01/19/2020 01/17/2020  WBC 4.0 - 10.5 K/uL - 22.5(H) 21.8(H)  Hemoglobin 12.0 - 15.0 g/dL 8.4(L) 8.8(L) 9.8(L)  Hematocrit 36.0 - 46.0 % 27.0(L) 27.8(L) 31.3(L)  Platelets 150 - 400 K/uL - 213 216    . CMP Latest Ref Rng &  Units 01/19/2020 01/17/2020 01/16/2020  Glucose 70 - 99 mg/dL 168(H) 160(H) 110(H)  BUN 8 - 23 mg/dL _0 Creatinine 0.44 - 1.00 mg/dL 0.90 1.12(H) 1.08(H)  Sodium 135 - 145 mmol/L 133(L) 136 133(L)  Potassium 3.5 - 5.1 mmol/L 3.6 3.5 3.0(L)  Chloride 98 - 111 mmol/L 99 100 100  CO2 22 - 32 mmol/L 25 24 23  Calcium 8.9 - 10.3 mg/dL 8.3(L) 8.4(L) 8.2(L)  Total Protein 6.5 - 8.1 g/dL - - 4.7(L)  Total Bilirubin 0.3 - 1.2 mg/dL - - 0.9  Alkaline Phos 38 - 126 U/L - - 75  AST 15 - 41 U/L - - 19  ALT 0 - 44 U/L - - 8   Studies/Results: No results found.  Principal Problem:   Failure to thrive in adult Active Problems:   Cirrhosis ([place])   Type 2 diabetes mellitus with diabetic polyneuropathy (HCC)   CKD (chronic kidney disease), stage III   Portal vein thrombosis   Nausea & vomiting   Myeloproliferative disorder ([place])   Goals of care, counseling/discussion   Palliative care by specialist   Malnutrition of moderate degree    Tye Savoy, NP-C @  01/20/2020, 10:28 AM

## 2020-01-20 NOTE — Progress Notes (Signed)
PROGRESS NOTE    Amanda [name]  TKW:[id number] DOB: [birth year] DOA: 01/13/2020 PCP: Amanda Peng, NP   Brief Narrative:  HPI On 01/13/2020 by Dr. Mitzi Hansen Amanda [name] is a 79 y.o. 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  Since recent hospitalization, the patient has remained in bed most of the time, not eating or drinking much, and not taking her medications. Symptoms wax and wane, she is able to tolerate small bites and sips sometimes. She denies fever or chills. SNF had been recommended by PT and physicians prior to the recent discharge but patient wanted to go home and her husband thought he would be able to care for her there. She now wants to go a nursing home. She had a visit with her PCP 2 days ago and it was advised that she return to the hospital in order to be placed in a nursing facility.   Interim history Failure to thrive.  She essentially is bedbound but has ongoing nausea and loss of appetite.  Also has decompensated cirrhosis.  TOC consulted.  Palliative care consulted. Currently has UTI, being treated with Merrem for a total of 5 days. Assessment & Plan   Failure to thrive/ Moderate malnutrition  -Patient presented from home where she essentially is bedbound with ongoing nausea and loss of appetite might have been replaced at a nursing facility -CT abdomen without acute abnormality.  Patient does have cirrhosis with portal hypertension, splenomegaly, small volume ascites, nonocclusive portal vein thrombosis.  Small left pleural effusion.  Hypodensity in the left urinary bladder, possible small bladder stone.  Abnormal endometrial thickening. -CT chest without acute pulmonary  process.  Positive for pulmonary hypertension, findings of cirrhosis with sequela of portal hypertension -Continue antiemetics as needed -Palliative care consulted and appreciated, continue supportive care -Continue Marinol for decreased appetite -Nutrition consulted, continue supplements  E. Coli/ Acinetobacter UTI -Initially was placed on ceftriaxone -Urine culture showed>100k E. coli and acinetobacter calcoaceticus/baumanii complex -Both sensitive to Zosyn however patient with penicillin allergy therefore placed on Merrem (starting on 3/31) -Discussed with Dr. Megan Salon, ID, via phone, given that patient was symptomatic, would continue to treat for 5 days total; today is day 4   GI bleed/acute on chronic normocytic anemia -hemoglobin has trended downward over the past few days -FOBT + -EGD done 05/26/2018: Gastritis, no active bleeding.  Refused colonoscopy at that time -Gastroenterology consulted and appreicated  -Anemia panel January 21 showed adequate iron and storage -Continue to monitor CBC -Eliquis held -Placed on Protonix 40 mg IV twice daily  Decompensated cirrhosis  -Continue PPI continue lactulose -Diuretic held given recent loss of appetite with nausea and vomiting -Continue to monitor LFTs, bilirubin, platelets and albumin  Diabetes mellitus, type II -Hemoglobin A1c was 5.1 earlier this month -Continue insulin sliding scale and CBG monitoring   Chronic kidney disease, stage IIIa -Creatinine appears to be at baseline  Myeloproliferative disorder -Patient with chronic leukocytosis -Procalcitonin 0.22, not clearly suggestive of infection -Will continue to treat UTI as above -Oncology, Dr. [name], consulted and appreciated, recommended holding jakafi  Portal vein thrombosis -Continue Eliquis  Coronary artery disease -No complaints of chest pain at this time -Continue Eliquis  Essential hypertension -Lasix and amlodipine on hold   Glaucoma -continue  eyedrops  Hypokalemia/hypomagnesemia -Resolved with replacement, continue to monitor and replace as needed  DVT Prophylaxis Eliquis held, SCDs ordered  Code Status: DNR  Family Communication: None at bedside  Disposition Plan: Admitted from home.  Pending SNF placement as family is unable to care for her at this time and patient has had poor oral intake. Will complete merrem therapy during hospitalization. Now with GIB. Suspect discharge to SNF in 48-72 hours. Will also need repeat COVID test close to time of discharge.  Consultants Oncology Palliative care ID, Dr. Megan Salon, via phone Gastroenterology  Procedures  None  Antibiotics   Anti-infectives (From admission, onward)   Start     Dose/Rate Route Frequency Ordered Stop   01/16/20 1500  meropenem (MERREM) 1 g in sodium chloride 0.9 % 100 mL IVPB     1 g 200 mL/hr over 30 Minutes Intravenous Every 12 hours 01/16/20 1349     01/14/20 1700  cefTRIAXone (ROCEPHIN) 1 g in sodium chloride 0.9 % 100 mL IVPB  Status:  Discontinued     1 g 200 mL/hr over 30 Minutes Intravenous Every 24 hours 01/14/20 1651 01/16/20 1330      Subjective:   Amanda [name] seen and examined today.  Feels very thirsty and would like something cold to drink.  Denies current chest pain or shortness of breath, abdominal pain, nausea or vomiting, diarrhea or constipation.  States she feels better. Objective:   Vitals:   01/19/20 1240 01/19/20 1618 01/19/20 2339 01/20/20 0424  BP: 138/66 (!) 142/60 (!) 116/46 (!) 143/60  Pulse: 100 100 98 (!) 108  Resp: 18 17 17 17   Temp: 98.1 F (36.7 C) 97.9 F (36.6 C) 98.4 F (36.9 C) 98.6 F (37 C)  TempSrc: Oral Oral Oral Oral  SpO2: 100% 97% 91% 93%  Weight:        Intake/Output Summary (Last 24 hours) at 01/20/2020 1124 Last data filed at 01/19/2020 1500 Gross per 24 hour  Intake 100 ml  Output --  Net 100 ml   Filed Weights   01/15/20 0500 01/16/20 0500 01/18/20 0352  Weight: 97 kg 95.6 kg 98.1 kg    Exam  General: Well developed, chronically ill appearing, NAD  HEENT: NCAT, mucous membranes moist.   Cardiovascular: S1 S2 auscultated, RRR  Respiratory: Clear to auscultation bilaterally  Abdomen: Soft, nontender, nondistended, + bowel sounds  Extremities: warm dry without cyanosis clubbing or edema  Neuro: AAOx3, nonfocal  Psych: Flat  Data Reviewed: I have personally reviewed following labs and imaging studies  CBC: Recent Labs  Lab 01/14/20 0242 01/14/20 0242 01/15/20 1622 01/16/20 0158 01/17/20 0454 01/19/20 0352 01/20/20 0758  WBC 22.9*  --  21.7* 15.6* 21.8* 22.5*  --   NEUTROABS 20.6*  --   --  13.1*  --   --   --   HGB 10.2*   < > 10.7* 9.2* 9.8* 8.8* 8.4*  HCT 32.2*   < > 33.5* 29.3* 31.3* 27.8* 27.0*  MCV 91.7  --  91.5 92.1 91.8 93.9  --   PLT 177  --  172 154 216 213  --    < > = values in this interval not displayed.   Basic Metabolic Panel: Recent Labs  Lab 01/14/20 0242 01/15/20 1622 01/16/20 0158 01/17/20 0454 01/19/20 0352  NA 136 132* 133* 136 133*  K 3.3* 3.6 3.0* 3.5 3.6  CL 101 99 100 100 99  CO2 25 21* 23 24 25   GLUCOSE 117* 148* 110* 160*  168*  BUN 8 10 8 11 15   CREATININE 1.03* 1.06* 1.08* 1.12* 0.90  CALCIUM 8.5* 8.3* 8.2* 8.4* 8.3*  MG 1.6*  --  2.0 1.9  --   PHOS 2.8  --  2.4*  --   --    GFR: Estimated Creatinine Clearance: 57.3 mL/min (by C-G formula based on SCr of 0.9 mg/dL). Liver Function Tests: Recent Labs  Lab 01/14/20 0242 01/16/20 0158  AST 18 19  ALT 8 8  ALKPHOS 78 75  BILITOT 1.7* 0.9  PROT 5.1* 4.7*  ALBUMIN 2.7* 2.5*   No results for input(s): LIPASE, AMYLASE in the last 168 hours. Recent Labs  Lab 01/14/20 0830  AMMONIA 31   Coagulation Profile: No results for input(s): INR, PROTIME in the last 168 hours. Cardiac Enzymes: No results for input(s): CKTOTAL, CKMB, CKMBINDEX, TROPONINI in the last 168 hours. BNP (last 3 results) No results for input(s): PROBNP in the last 8760  hours. HbA1C: No results for input(s): HGBA1C in the last 72 hours. CBG: Recent Labs  Lab 01/19/20 1616 01/19/20 2022 01/19/20 2340 01/20/20 0423 01/20/20 0754  GLUCAP 141* 187* 129* 148* 149*   Lipid Profile: No results for input(s): CHOL, HDL, LDLCALC, TRIG, CHOLHDL, LDLDIRECT in the last 72 hours. Thyroid Function Tests: No results for input(s): TSH, T4TOTAL, FREET4, T3FREE, THYROIDAB in the last 72 hours. Anemia Panel: No results for input(s): VITAMINB12, FOLATE, FERRITIN, TIBC, IRON, RETICCTPCT in the last 72 hours. Urine analysis:    Component Value Date/Time   COLORURINE YELLOW 01/14/2020 0821   APPEARANCEUR CLEAR 01/14/2020 0821   LABSPEC <1.005 (L) 01/14/2020 0821   PHURINE 5.0 01/14/2020 0821   GLUCOSEU NEGATIVE 01/14/2020 0821   GLUCOSEU NEGATIVE 01/10/2018 1217   HGBUR SMALL (A) 01/14/2020 0821   HGBUR large 05/07/2010 0845   BILIRUBINUR MODERATE (A) 01/14/2020 0821   BILIRUBINUR neg 01/26/2016 1003   KETONESUR NEGATIVE 01/14/2020 0821   PROTEINUR NEGATIVE 01/14/2020 0821   UROBILINOGEN 0.2 01/10/2018 1217   NITRITE POSITIVE (A) 01/14/2020 0821   LEUKOCYTESUR NEGATIVE 01/14/2020 0821   Sepsis Labs: @LABRCNTIP (procalcitonin:4,lacticidven:4)  ) Recent Results (from the past 240 hour(s))  SARS CORONAVIRUS 2 (TAT 6-24 HRS) Nasopharyngeal Nasopharyngeal Swab     Status: None   Collection Time: 01/13/20  4:00 AM   Specimen: Nasopharyngeal Swab  Result Value Ref Range Status   SARS Coronavirus 2 NEGATIVE NEGATIVE Final    Comment: (NOTE) SARS-CoV-2 target nucleic acids are NOT DETECTED. The SARS-CoV-2 RNA is generally detectable in upper and lower respiratory specimens during the acute phase of infection. Negative results do not preclude SARS-CoV-2 infection, do not rule out co-infections with other pathogens, and should not be used as the sole basis for treatment or other patient management decisions. Negative results must be combined with clinical  observations, patient history, and epidemiological information. The expected result is Negative. Fact Sheet for Patients: SugarRoll.be Fact Sheet for Healthcare Providers: https://www.woods-mathews.com/ This test is not yet approved or cleared by the [place] FDA and  has been authorized for detection and/or diagnosis of SARS-CoV-2 by FDA under an Emergency Use Authorization (EUA). This EUA will remain  in effect (meaning this test can be used) for the duration of the COVID-19 declaration under Section 56 4(b)(1) of the Act, 21 U.S.C. section 360bbb-3(b)(1), unless the authorization is terminated or revoked sooner. Performed at [facility], [place] [address] [place], [place] [address]   Culture, Urine     Status: Abnormal   Collection Time: 01/14/20  4:55 PM   Specimen: Urine, Random  Result Value Ref Range Status   Specimen Description URINE, RANDOM  Final   Special Requests   Final    NONE Performed at [facility], [address], [place], [place] [address]    Culture (A)  Final    >=100,000 COLONIES/mL ACINETOBACTER CALCOACETICUS/BAUMANNII COMPLEX >=100,000 COLONIES/mL ESCHERICHIA COLI    Report Status 01/16/2020 FINAL  Final   Organism ID, Bacteria ACINETOBACTER CALCOACETICUS/BAUMANNII COMPLEX (A)  Final   Organism ID, Bacteria ESCHERICHIA COLI (A)  Final      Susceptibility   Acinetobacter calcoaceticus/baumannii complex - MIC*    CEFTAZIDIME 4 SENSITIVE Sensitive     CIPROFLOXACIN <=0.25 SENSITIVE Sensitive     GENTAMICIN <=1 SENSITIVE Sensitive     IMIPENEM <=0.25 SENSITIVE Sensitive     PIP/TAZO <=4 SENSITIVE Sensitive     TRIMETH/SULFA <=20 SENSITIVE Sensitive     AMPICILLIN/SULBACTAM <=2 SENSITIVE Sensitive     * >=100,000 COLONIES/mL ACINETOBACTER CALCOACETICUS/BAUMANNII COMPLEX   Escherichia coli - MIC*    AMPICILLIN >=32 RESISTANT Resistant     CEFAZOLIN <=4 SENSITIVE Sensitive     CEFTRIAXONE <=0.25  SENSITIVE Sensitive     CIPROFLOXACIN >=4 RESISTANT Resistant     GENTAMICIN <=1 SENSITIVE Sensitive     IMIPENEM <=0.25 SENSITIVE Sensitive     NITROFURANTOIN <=16 SENSITIVE Sensitive     TRIMETH/SULFA >=320 RESISTANT Resistant     AMPICILLIN/SULBACTAM >=32 RESISTANT Resistant     PIP/TAZO <=4 SENSITIVE Sensitive     * >=100,000 COLONIES/mL ESCHERICHIA COLI      Radiology Studies: No results found.   Scheduled Meds: . Chlorhexidine Gluconate Cloth  6 each Topical Daily  . Chlorhexidine Gluconate Cloth  6 each Topical Daily  . dronabinol  2.5 mg Oral BID AC  . feeding supplement (ENSURE ENLIVE)  237 mL Oral BID BM  . insulin aspart  0-9 Units Subcutaneous Q4H  . lactulose  20 g Oral BID  . latanoprost  1 drop Both Eyes QHS  . mirtazapine  15 mg Oral QHS  . multivitamin with minerals  1 tablet Oral Daily  . pantoprazole (PROTONIX) IV  40 mg Intravenous Q12H  . timolol  1 drop Both Eyes Daily   Continuous Infusions: . meropenem (MERREM) IV 1 g (01/20/20 0834)     LOS: 6 days   Time Spent in minutes   45 minutes  Sharnita Bogucki D.O. on 01/20/2020 at 11:24 AM  Between 7am to 7pm - Please see pager noted on [email or web address]  After 7pm go to [email or web address]  And look for the night coverage person covering for me after hours  [facility] Office  [phone]

## 2020-01-21 ENCOUNTER — Inpatient Hospital Stay (HOSPITAL_COMMUNITY): Payer: Medicare HMO | Admitting: Anesthesiology

## 2020-01-21 ENCOUNTER — Encounter (HOSPITAL_COMMUNITY)
Admission: EM | Disposition: A | Discharge status: Skilled Nursing Facility | Payer: Self-pay | Source: Home / Self Care | Attending: Internal Medicine

## 2020-01-21 ENCOUNTER — Encounter (HOSPITAL_COMMUNITY): Payer: Self-pay | Admitting: Family Medicine

## 2020-01-21 DIAGNOSIS — K3189 Other diseases of stomach and duodenum: Secondary | ICD-10-CM

## 2020-01-21 DIAGNOSIS — K31819 Angiodysplasia of stomach and duodenum without bleeding: Secondary | ICD-10-CM

## 2020-01-21 DIAGNOSIS — K5521 Angiodysplasia of colon with hemorrhage: Secondary | ICD-10-CM

## 2020-01-21 HISTORY — PX: HEMOSTASIS CLIP PLACEMENT: SHX6857

## 2020-01-21 HISTORY — PX: HOT HEMOSTASIS: SHX5433

## 2020-01-21 HISTORY — PX: ESOPHAGOGASTRODUODENOSCOPY (EGD) WITH PROPOFOL: SHX5813

## 2020-01-21 LAB — BASIC METABOLIC PANEL
Anion gap: 10 (ref 5–15)
BUN: 13 mg/dL (ref 8–23)
CO2: 26 mmol/L (ref 22–32)
Calcium: 8.5 mg/dL — ABNORMAL LOW (ref 8.9–10.3)
Chloride: 100 mmol/L (ref 98–111)
Creatinine, Ser: 0.9 mg/dL (ref 0.44–1.00)
GFR calc Af Amer: >60 mL/min (ref >60)
GFR calc non Af Amer: >60 mL/min (ref >60)
Glucose, Bld: 135 mg/dL — ABNORMAL HIGH (ref 70–99)
Potassium: 3.8 mmol/L (ref 3.5–5.1)
Sodium: 136 mmol/L (ref 135–145)

## 2020-01-21 LAB — CBC
HCT: 26 % — ABNORMAL LOW (ref 36.0–46.0)
Hemoglobin: 7.8 g/dL — ABNORMAL LOW (ref 12.0–15.0)
MCH: 29.3 pg (ref 26.0–34.0)
MCHC: 30 g/dL (ref 30.0–36.0)
MCV: 97.7 fL (ref 80.0–100.0)
Platelets: 242 10*3/uL (ref 150–400)
RBC: 2.66 MIL/uL — ABNORMAL LOW (ref 3.87–5.11)
RDW: 22.1 % — ABNORMAL HIGH (ref 11.5–15.5)
WBC: 27.2 10*3/uL — ABNORMAL HIGH (ref 4.0–10.5)
nRBC: 1.9 % — ABNORMAL HIGH (ref 0.0–0.2)

## 2020-01-21 LAB — GLUCOSE, CAPILLARY
Glucose-Capillary: 143 mg/dL — ABNORMAL HIGH (ref 70–99)
Glucose-Capillary: 121 mg/dL — ABNORMAL HIGH (ref 70–99)
Glucose-Capillary: 119 mg/dL — ABNORMAL HIGH (ref 70–99)

## 2020-01-21 SURGERY — ESOPHAGOGASTRODUODENOSCOPY (EGD) WITH PROPOFOL
Anesthesia: Monitor Anesthesia Care

## 2020-01-21 MED ORDER — PHENYLEPHRINE HCL (PRESSORS) 10 MG/ML IV SOLN
INTRAVENOUS | Status: DC | PRN
  Administered 2020-01-21 (×2): 120 ug via INTRAVENOUS

## 2020-01-21 MED ORDER — SODIUM CHLORIDE 0.9 % IV SOLN: INTRAVENOUS | Status: DC | PRN

## 2020-01-21 MED ORDER — LACTATED RINGERS IV SOLN: INTRAVENOUS | Status: DC | PRN

## 2020-01-21 MED ORDER — PROPOFOL 500 MG/50ML IV EMUL
INTRAVENOUS | Status: DC | PRN
  Administered 2020-01-21: 100 ug/kg/min via INTRAVENOUS

## 2020-01-21 MED ORDER — PROPOFOL 10 MG/ML IV BOLUS
INTRAVENOUS | Status: DC | PRN
  Administered 2020-01-21: 20 mg via INTRAVENOUS

## 2020-01-21 SURGICAL SUPPLY — 15 items

## 2020-01-21 NOTE — Progress Notes (Signed)
PT Cancellation Note  Upon chart review, pt currently off the floor for planned EGD with propofol and is not available for PT treat. PT will follow up at later date/time as able and pt appropriate.   Zachary George PT, DPT 8:02 AM,01/21/20

## 2020-01-21 NOTE — Op Note (Addendum)
[facility] Patient Name: Amanda [name] Procedure Date : 01/21/2020 MRN: [id number] Attending MD: Carlota Raspberry. Chandlar Staebell , MD Date of Birth: 29-Jun-1941 CSN: [id number] Age: 79 Admit Type: Inpatient Procedure:                Upper GI endoscopy Indications:              Suspected upper gastrointestinal bleeding, dark                            stools in the setting of Eliquis (last taken                            evening of 4/3), worsening anemia. Providers:                Carlota Raspberry. Havery Moros, MD, Angus Seller, William Dalton, Technician Referring MD:              Medicines:                Monitored Anesthesia Care Complications:            No immediate complications. Estimated blood loss:                            Minimal. Estimated Blood Loss:     Estimated blood loss was minimal. Procedure:                Pre-Anesthesia Assessment:                           - Prior to the procedure, a History and Physical                            was performed, and patient medications and                            allergies were reviewed. The patient's tolerance of                            previous anesthesia was also reviewed. The risks                            and benefits of the procedure and the sedation                            options and risks were discussed with the patient.                            All questions were answered, and informed consent                            was obtained. Prior Anticoagulants: The patient has  taken Eliquis (apixaban), last dose was 2 days                            prior to procedure. ASA Grade Assessment: III - A                            patient with severe systemic disease. After                            reviewing the risks and benefits, the patient was                            deemed in satisfactory condition to undergo the                            procedure.                    After obtaining informed consent, the endoscope was                            passed under direct vision. Throughout the                            procedure, the patient's blood pressure, pulse, and                            oxygen saturations were monitored continuously. The                            [id number]-[id number] ([id number] Olympus gastroscope was                            introduced through the mouth, and advanced to the                            third part of duodenum. The upper GI endoscopy was                            accomplished without difficulty. The patient                            tolerated the procedure well. Scope In: Scope Out: Findings:      Esophagogastric landmarks were identified: the Z-line was found at 38       cm, the gastroesophageal junction was found at 38 cm and the upper       extent of the gastric folds was found at 38 cm from the incisors.      The exam of the esophagus was otherwise normal. No varices.      Diffuse atrophic mucosa with congestion was found in the entire examined       stomach, possibly due to portal hypertension.      The exam of the stomach was otherwise normal. No ulcerations / erosions.      Four small angiodysplastic lesions were found in the second portion of  the duodenum (2) and in the third portion of the duodenum (2) , largest       in the second portion was a few mm in size. Fulguration to ablate the       lesions by argon plasma was successful. There was mild oozing at the       site of the largest lesion. For hemostasis, two hemostatic clips were       successfully placed across the site. No bleeding noted after therapy.       The bowel was quite spastic and due to positioning it was technically       challenging to treat this lesion.      The exam of the duodenum was otherwise normal other than prominent small       benign lymphangiectasias. Impression:               - Esophagogastric landmarks  identified.                           - Normal esophagus otherwise. No varices.                           - Gastric mucosal atrophy with congestion, likely                            due to portal hypertension. No gastric ulcers..                           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 Treated with argon plasma                            coagulation (APC). 2 Clips were placed. Recommendation:           - Return patient to the hospital ward                           - Clear liquid diet later today                           - Continue present medications.                           - Hold Eliquis for now, repeat Hgb tomorrow and                            transfuse as needed.                           - Will reassess in the AM, call sooner with  questions. Procedure Code(s):        --- Professional ---                           [id number], Esophagogastroduodenoscopy, flexible,                            transoral; with ablation of tumor(s), polyp(s), or                            other lesion(s) (includes pre- and post-dilation                            and guide wire passage, when performed)                           94496, 36, Esophagogastroduodenoscopy, flexible,                            transoral; with control of bleeding, any method Diagnosis Code(s):        --- Professional ---                           K31.89, Other diseases of stomach and duodenum                           K31.819, Angiodysplasia of stomach and duodenum                            without bleeding CPT copyright 2019 American Medical Association. All rights reserved. The codes documented in this report are preliminary and upon coder review may  be revised to meet current compliance requirements. Remo Lipps P. Aidric Endicott, MD 01/21/2020 9:05:45 AM This report has  been signed electronically. Number of Addenda: 0

## 2020-01-21 NOTE — Progress Notes (Signed)
Physical Therapy Treatment Patient Details Name: Amanda [name] MRN: [id number] DOB: 1940/12/01 Today's Date: 01/21/2020    History of Present Illness 79 y.o. female  with past medical history of CAD (s/p stents), DM2, myeloproliferative disorder, decompensated liver cirrhosis, CKD, and portal vein thrombosis admitted on 01/13/2020 with nausea, vomiting, and anorexia. Patient had admission about 2 weeks ago for colitis. Found to have [name]. She was admitted for failure to thrive. PMT consulted to assist with [place].    PT Comments    Pt received resting in bed with husband present. Pt required extensive encouragement from therapist, nursing and MD to participate. Pt very limited with active participation due to abdominal and low back discomfort and fatigue. Pt also had EGD this morning. Pt received partially sidelying and refusing to roll onto her back for therex. Pt performed supine UE and LE therex (see below) required max cuing for technique and participation. Physical assist required for SLR and hip abd. Pt eventually agreeable to roll onto back and briefly to other side requiring min A and heavy use of rails. Pt immediately returning to right sidelying and refusing any further activity. Pt educated on importance of functional mobility for prevention of deconditioning and secondary complications. Pt educated on importance of increasing tolerance to [facility] elevated and increased HOB for improved tolerance to upright and pt reporting dizziness during rolling. Pt encouraged to continue working on therex on her own. Session extremely limited due to pts fatigue and discomfort and not willing to actively participate much. Pt assisted into improved sidelying position will pillows behind and between legs. PT will continue to follow acute, recommendation appropriate.    Follow Up Recommendations  SNF     Equipment Recommendations  Other (comment)(TBD next venue)    Recommendations for Other Services        Precautions / Restrictions Precautions Precautions: Fall Precaution Comments: CONTACT, Monitor for s/s of nausea, diarrhea/ vomiting. Restrictions Weight Bearing Restrictions: No    Mobility  Bed Mobility     Rolling: Min assist         General bed mobility comments: pt refusing to get EOB, min A and heavy use of bed rails for rolling either direction  Transfers                 General transfer comment: deferred  Ambulation/Gait                 Stairs             Wheelchair Mobility    Modified Rankin (Stroke Patients Only)       Balance                                            Cognition Arousal/Alertness: Awake/alert Behavior During Therapy: Anxious;Flat affect Overall Cognitive Status: Within Functional Limits for tasks assessed                                 General Comments: pt appeared agitated during session with very limited active participation, complaining about how tired she is and needed to rest and mostly not willing to move      Exercises Total Joint Exercises Ankle Circles/Pumps: AROM;Both;10 reps Quad Sets: AROM;Both;5 reps Towel Squeeze: AROM;Both;5 reps Short Arc Quad: AROM;Both;5 reps Heel Slides: AROM;Both;5 reps Hip  ABduction/ADduction: AAROM;Both;10 reps Straight Leg Raises: AAROM;Both;10 reps General Exercises - Upper Extremity Shoulder Flexion: AROM;Both;10 reps Elbow Flexion: AROM;Both;10 reps Other Exercises Other Exercises: 10 clamshells on LLE pt refusing to lay on LLE to perform on RLE Other Exercises: chest press in supine x10 Other Exercises: pt educated on importance for increased HOB for improved postural tolerance    General Comments        Pertinent Vitals/Pain Pain Assessment: Faces Faces Pain Scale: Hurts little more Pain Location: Abdomen Pain Descriptors / Indicators: Sore Pain Intervention(s): Monitored during session;Repositioned    Home  Living                      Prior Function            PT Goals (current goals can now be found in the care plan section) Progress towards PT goals: Progressing toward goals    Frequency    Min 3X/week      PT Plan Current plan remains appropriate    Co-evaluation              AM-PAC PT "6 Clicks" Mobility   Outcome Measure  Help needed turning from your back to your side while in a flat bed without using bedrails?: A Lot Help needed moving from lying on your back to sitting on the side of a flat bed without using bedrails?: A Lot Help needed moving to and from a bed to a chair (including a wheelchair)?: Total Help needed standing up from a chair using your arms (e.g., wheelchair or bedside chair)?: Total Help needed to walk in hospital room?: Total Help needed climbing 3-5 steps with a railing? : Total 6 Click Score: 8    End of Session   Activity Tolerance: Patient limited by fatigue Patient left: in bed;with call bell/phone within reach;with bed alarm set;with family/visitor present Nurse Communication: Mobility status PT Visit Diagnosis: Muscle weakness (generalized) (M62.81);Difficulty in walking, not elsewhere classified (R26.2);Dizziness and giddiness (R42)     Time: 4503-8882 PT Time Calculation (min) (ACUTE ONLY): 16 min  Charges:  $Therapeutic Exercise: 8-22 mins                     [name] PT, DPT 1:35 PM,01/21/20    Shelton Soler Drucilla Chalet 01/21/2020, 1:30 PM

## 2020-01-21 NOTE — Consult Note (Signed)
   [facility] CM Inpatient Consult   01/21/2020  Amanda [name] Nov 18, 1940 [id number]   Patient screened for high risk score for unplanned readmission and for length of stay hospitalizations with less than 30 days readmission noted.  Chart reviewed  to check if potential [place] [facility] services are needed.  Review of patient's medical record reveals patient is of the physical therapist. OT and inpatient Transition of Care team the patient is being recommended for a skilled nursing facility [SNF] level of care.  Patient is in the [facility] plan with [facility]. Plan:  Continue to follow progress and disposition to assess for post hospital care management needs.  Currently, plan is for a SNF rehab stay and needs will be met at that level of care.  Please place a [facility] Care Management consult as appropriate if above disposition changes or/and for questions contact:   Natividad Brood, RN BSN [facility] Liaison  [phone] business mobile phone Toll free office [phone]  Fax number: [phone] [name]

## 2020-01-21 NOTE — Anesthesia Procedure Notes (Signed)
Procedure Name: MAC Date/Time: 01/21/2020 8:11 AM Performed by: Inda Coke, CRNA Pre-anesthesia Checklist: Patient identified, Emergency Drugs available, Suction available, Timeout performed and Patient being monitored Patient Re-evaluated:Patient Re-evaluated prior to induction Oxygen Delivery Method: Nasal cannula Induction Type: IV induction Dental Injury: Teeth and Oropharynx as per pre-operative assessment

## 2020-01-21 NOTE — Anesthesia Postprocedure Evaluation (Signed)
Anesthesia Post Note  Patient: Amanda [name]  Procedure(s) Performed: ESOPHAGOGASTRODUODENOSCOPY (EGD) WITH PROPOFOL (N/A ) HOT HEMOSTASIS (ARGON PLASMA COAGULATION/BICAP) (N/A ) HEMOSTASIS CLIP PLACEMENT     Patient location during evaluation: PACU Anesthesia Type: MAC Level of consciousness: awake Pain management: pain level controlled Vital Signs Assessment: post-procedure vital signs reviewed and stable Respiratory status: spontaneous breathing, nonlabored ventilation, respiratory function stable and patient connected to nasal cannula oxygen Cardiovascular status: stable and blood pressure returned to baseline Postop Assessment: no apparent nausea or vomiting Anesthetic complications: no    Last Vitals:  Vitals:   01/21/20 0930 01/21/20 1400  BP: (!) 150/57 116/66  Pulse: 99 96  Resp: 19 18  Temp:  36.8 C  SpO2: 92% 95%    Last Pain:  Vitals:   01/21/20 1400  TempSrc: Oral  PainSc:                  Amanda [name]

## 2020-01-21 NOTE — Interval H&P Note (Signed)
History and Physical Interval Note:  01/21/2020 7:27 AM  Amanda [name]  has presented today for surgery, with the diagnosis of anemia, hemoccult positive stool.  The various methods of treatment have been discussed with the patient and family. After consideration of risks, benefits and other options for treatment, the patient has consented to  Procedure(s): ESOPHAGOGASTRODUODENOSCOPY (EGD) WITH PROPOFOL (N/A) as a surgical intervention.  The patient's history has been reviewed, patient examined, no change in status, stable for surgery.  I have reviewed the patient's chart and labs.  Questions were answered to the patient's satisfaction.     [place]

## 2020-01-21 NOTE — TOC Progression Note (Signed)
Transition of Care [facility]) - Progression Note    Patient Details  Name: Amanda [name] MRN: [id number] Date of Birth: 07-22-1941  Transition of Care [facility]) CM/SW [place], [place] Phone Number: 01/21/2020, 11:42 AM  Clinical Narrative:    CSW has sent additional therapy notes to SNF- pt s/p EGD today. Auth for SNF good through 4/7. Will need new COVID.    Expected Discharge Plan: Skilled Nursing Facility Barriers to Discharge: Continued Medical Work up  Expected Discharge Plan and Services Expected Discharge Plan: [place] In-house Referral: Clinical Social Work Discharge Planning Services: CM Consult Post Acute Care Choice: [place] Living arrangements for the past 2 months: Single Family Home  Readmission Risk Interventions Readmission Risk Prevention Plan 01/17/2020  Transportation Screening Complete  PCP or Specialist Appt within 3-5 Days Not Complete  Not Complete comments plan for SNF  HRI or Home Care Consult Complete  Social Work Consult for [place] Planning/Counseling Complete  Palliative Care Screening Complete  Medication Review Press photographer) Referral to Pharmacy  Some recent data might be hidden

## 2020-01-21 NOTE — Progress Notes (Signed)
PROGRESS NOTE    Amanda [name]  KNL:[id number] DOB: 02/28/1941 DOA: 01/13/2020 PCP: [name], NP   Brief Narrative:  HPI On 01/13/2020 by Dr. Mitzi Hansen Amanda [name] is a 79 y.o. 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  Since recent hospitalization, the patient has remained in bed most of the time, not eating or drinking much, and not taking her medications. Symptoms wax and wane, she is able to tolerate small bites and sips sometimes. She denies fever or chills. SNF had been recommended by PT and physicians prior to the recent discharge but patient wanted to go home and her husband thought he would be able to care for her there. She now wants to go a nursing home. She had a visit with her PCP 2 days ago and it was advised that she return to the hospital in order to be placed in a nursing facility.   Interim history Failure to thrive.  She essentially is bedbound but has ongoing nausea and loss of appetite.  Also has decompensated cirrhosis.  TOC consulted.  Palliative care consulted. Currently has UTI, being treated with Merrem for a total of 5 days. Now with GIB, gastroenterology consulted, s/p EGD.  Assessment & Plan   Failure to thrive/ Moderate malnutrition  -Patient presented from home where she essentially is bedbound with ongoing nausea and loss of appetite might have been replaced at a nursing facility -CT abdomen without acute abnormality.  Patient does have cirrhosis with portal hypertension, splenomegaly, small volume ascites, nonocclusive portal vein thrombosis.  Small left pleural effusion.  Hypodensity in the left urinary bladder, possible small bladder stone.  Abnormal  endometrial thickening. -CT chest without acute pulmonary process.  Positive for pulmonary hypertension, findings of cirrhosis with sequela of portal hypertension -Continue antiemetics as needed -Palliative care consulted and appreciated, continue supportive care -Continue Marinol for decreased appetite -Nutrition consulted, continue supplements  E. Coli/ Acinetobacter UTI -Initially was placed on ceftriaxone -Urine culture showed>100k E. coli and acinetobacter calcoaceticus/baumanii complex -Both sensitive to Zosyn however patient with penicillin allergy therefore placed on Merrem (starting on 3/31) -Discussed with Dr. Megan Salon, ID, via phone, given that patient was symptomatic, would continue to treat for 5 days total -Completed Merrem therapy  GI bleed/acute on chronic normocytic anemia -hemoglobin has trended downward over the past few days -FOBT + -EGD done 05/26/2018: Gastritis, no active bleeding.  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  Could be because of symptoms in the setting of anticoagulation.  Treated with APC (argon plasma coagulation).  2 clips were placed.  Recommendations were to hold Eliquis for now and to repeat hemoglobin and transfuse as needed.  Decompensated cirrhosis  -Continue PPI continue lactulose -Diuretic held given recent loss of appetite with nausea and vomiting -Continue to monitor LFTs, bilirubin, platelets and albumin  Diabetes mellitus, type II -Hemoglobin A1c was 5.1 earlier this month -Continue insulin sliding scale and CBG monitoring   Chronic kidney disease, stage IIIa -Creatinine appears to be at baseline  Myeloproliferative disorder -Patient with chronic  leukocytosis -Procalcitonin 0.22, not clearly suggestive of infection -Will continue to treat UTI as above -Oncology, Dr. [name], consulted and appreciated, recommended holding jakafi  Portal vein thrombosis -Continue Eliquis  Coronary artery disease -No complaints of chest pain at this time -Continue Eliquis  Essential hypertension -Lasix and amlodipine on hold   Glaucoma -continue eyedrops  Hypokalemia/hypomagnesemia -Resolved with replacement, continue to monitor and replace as needed  DVT Prophylaxis Eliquis held, SCDs ordered  Code Status: DNR  Family Communication: Husband at bedside  Disposition Plan: Admitted from home.  Pending SNF placement as family is unable to care for her at this time and patient has had poor oral intake. Now with GIB. Suspect discharge to SNF in 48-72 hours. Will also need repeat COVID test close to time of discharge.  Consultants Oncology Palliative care ID, Dr. Megan Salon, via phone Gastroenterology  Procedures  None  Antibiotics   Anti-infectives (From admission, onward)   Start     Dose/Rate Route Frequency Ordered Stop   01/16/20 1500  meropenem (MERREM) 1 g in sodium chloride 0.9 % 100 mL IVPB  Status:  Discontinued     1 g 200 mL/hr over 30 Minutes Intravenous Every 12 hours 01/16/20 1349 01/21/20 1127   01/14/20 1700  cefTRIAXone (ROCEPHIN) 1 g in sodium chloride 0.9 % 100 mL IVPB  Status:  Discontinued     1 g 200 mL/hr over 30 Minutes Intravenous Every 24 hours 01/14/20 1651 01/16/20 1330      Subjective:   Amanda [name] seen and examined today.  No complaints today.  Return from EGD.  Wanting to drinks cold water.  Denies chest pain, shortness of breath, abdominal pain. Objective:   Vitals:   01/21/20 0900 01/21/20 0910 01/21/20 0920 01/21/20 0930  BP: (!) 118/29 (!) 118/49 (!) 116/38 (!) 150/57  Pulse: (!) 109 (!) 109 (!) 104 99  Resp: (!) 24 20 20 19   Temp: 99 F (37.2 C)     TempSrc: Oral     SpO2: 94% 93% 92%  92%  Weight:        Intake/Output Summary (Last 24 hours) at 01/21/2020 1352 Last data filed at 01/21/2020 6195 Gross per 24 hour  Intake 250 ml  Output 400 ml  Net -150 ml   Filed Weights   01/15/20 0500 01/16/20 0500 01/18/20 0352  Weight: 97 kg 95.6 kg 98.1 kg   Exam  General: Well developed, chronically ill-appearing, NAD  HEENT: NCAT, mucous membranes moist.   Cardiovascular: S1 S2 auscultated, RRR  Respiratory: Clear to auscultation bilaterally with equal chest rise  Abdomen: Soft, obese, nontender, nondistended, + bowel sounds  Extremities: warm dry without cyanosis clubbing or edema  Neuro: AAOx3, nonfocal  Psych: Appropriate  Data Reviewed: I have personally reviewed following labs and imaging studies  CBC: Recent Labs  Lab 01/15/20 1622 01/15/20 1622 01/16/20 0158 01/17/20 0454 01/19/20 0352 01/20/20 0758 01/21/20 0410  WBC 21.7*  --  15.6* 21.8* 22.5*  --  27.2*  NEUTROABS  --   --  13.1*  --   --   --   --   HGB 10.7*   < > 9.2* 9.8* 8.8* 8.4* 7.8*  HCT 33.5*   < > 29.3* 31.3* 27.8* 27.0* 26.0*  MCV 91.5  --  92.1 91.8 93.9  --  97.7  PLT 172  --  154 216 213  --  242   < > = values in this interval not displayed.  Basic Metabolic Panel: Recent Labs  Lab 01/15/20 1622 01/16/20 0158 01/17/20 0454 01/19/20 0352 01/21/20 0410  NA 132* 133* 136 133* 136  K 3.6 3.0* 3.5 3.6 3.8  CL 99 100 100 99 100  CO2 21* 23 24 25 26   GLUCOSE 148* 110* 160* 168* 135*  BUN 10 8 11 15 13   CREATININE 1.06* 1.08* 1.12* 0.90 0.90  CALCIUM 8.3* 8.2* 8.4* 8.3* 8.5*  MG  --  2.0 1.9  --   --   PHOS  --  2.4*  --   --   --    GFR: Estimated Creatinine Clearance: 57.3 mL/min (by C-G formula based on SCr of 0.9 mg/dL). Liver Function Tests: Recent Labs  Lab 01/16/20 0158  AST 19  ALT 8  ALKPHOS 75  BILITOT 0.9  PROT 4.7*  ALBUMIN 2.5*   No results for input(s): LIPASE, AMYLASE in the last 168 hours. No results for input(s): AMMONIA in the last 168  hours. Coagulation Profile: No results for input(s): INR, PROTIME in the last 168 hours. Cardiac Enzymes: No results for input(s): CKTOTAL, CKMB, CKMBINDEX, TROPONINI in the last 168 hours. BNP (last 3 results) No results for input(s): PROBNP in the last 8760 hours. HbA1C: No results for input(s): HGBA1C in the last 72 hours. CBG: Recent Labs  Lab 01/20/20 0754 01/20/20 1252 01/20/20 1622 01/20/20 2158 01/21/20 1144  GLUCAP 149* 162* 119* 109* 143*   Lipid Profile: No results for input(s): CHOL, HDL, LDLCALC, TRIG, CHOLHDL, LDLDIRECT in the last 72 hours. Thyroid Function Tests: No results for input(s): TSH, T4TOTAL, FREET4, T3FREE, THYROIDAB in the last 72 hours. Anemia Panel: No results for input(s): VITAMINB12, FOLATE, FERRITIN, TIBC, IRON, RETICCTPCT in the last 72 hours. Urine analysis:    Component Value Date/Time   COLORURINE YELLOW 01/14/2020 0821   APPEARANCEUR CLEAR 01/14/2020 0821   LABSPEC <1.005 (L) 01/14/2020 0821   PHURINE 5.0 01/14/2020 0821   GLUCOSEU NEGATIVE 01/14/2020 0821   GLUCOSEU NEGATIVE 01/10/2018 1217   HGBUR SMALL (A) 01/14/2020 0821   HGBUR large 05/07/2010 0845   BILIRUBINUR MODERATE (A) 01/14/2020 0821   BILIRUBINUR neg 01/26/2016 1003   KETONESUR NEGATIVE 01/14/2020 0821   PROTEINUR NEGATIVE 01/14/2020 0821   UROBILINOGEN 0.2 01/10/2018 1217   NITRITE POSITIVE (A) 01/14/2020 0821   LEUKOCYTESUR NEGATIVE 01/14/2020 0821   Sepsis Labs: @LABRCNTIP (procalcitonin:4,lacticidven:4)  ) Recent Results (from the past 240 hour(s))  SARS CORONAVIRUS 2 (TAT 6-24 HRS) Nasopharyngeal Nasopharyngeal Swab     Status: None   Collection Time: 01/13/20  4:00 AM   Specimen: Nasopharyngeal Swab  Result Value Ref Range Status   SARS Coronavirus 2 NEGATIVE NEGATIVE Final    Comment: (NOTE) SARS-CoV-2 target nucleic acids are NOT DETECTED. The SARS-CoV-2 RNA is generally detectable in upper and lower respiratory specimens during the acute phase of  infection. Negative results do not preclude SARS-CoV-2 infection, do not rule out co-infections with other pathogens, and should not be used as the sole basis for treatment or other patient management decisions. Negative results must be combined with clinical observations, patient history, and epidemiological information. The expected result is Negative. Fact Sheet for Patients: SugarRoll.be Fact Sheet for Healthcare Providers: https://www.woods-mathews.com/ This test is not yet approved or cleared by the [place] FDA and  has been authorized for detection and/or diagnosis of SARS-CoV-2 by FDA under an Emergency Use Authorization (EUA). This EUA will remain  in effect (meaning this test can be used) for the duration of the COVID-19 declaration under  Section 56 4(b)(1) of the Act, 21 U.S.C. section 360bbb-3(b)(1), unless the authorization is terminated or revoked sooner. Performed at [facility], [place] [address] [place], [place] [address]   Culture, Urine     Status: Abnormal   Collection Time: 01/14/20  4:55 PM   Specimen: Urine, Random  Result Value Ref Range Status   Specimen Description URINE, RANDOM  Final   Special Requests   Final    NONE Performed at [facility], [place] [address] [place], [place] [address]    Culture (A)  Final    >=100,000 COLONIES/mL ACINETOBACTER CALCOACETICUS/BAUMANNII COMPLEX >=100,000 COLONIES/mL ESCHERICHIA COLI    Report Status 01/16/2020 FINAL  Final   Organism ID, Bacteria ACINETOBACTER CALCOACETICUS/BAUMANNII COMPLEX (A)  Final   Organism ID, Bacteria ESCHERICHIA COLI (A)  Final      Susceptibility   Acinetobacter calcoaceticus/baumannii complex - MIC*    CEFTAZIDIME 4 SENSITIVE Sensitive     CIPROFLOXACIN <=0.25 SENSITIVE Sensitive     GENTAMICIN <=1 SENSITIVE Sensitive     IMIPENEM <=0.25 SENSITIVE Sensitive     PIP/TAZO <=4 SENSITIVE Sensitive     TRIMETH/SULFA <=20 SENSITIVE  Sensitive     AMPICILLIN/SULBACTAM <=2 SENSITIVE Sensitive     * >=100,000 COLONIES/mL ACINETOBACTER CALCOACETICUS/BAUMANNII COMPLEX   Escherichia coli - MIC*    AMPICILLIN >=32 RESISTANT Resistant     CEFAZOLIN <=4 SENSITIVE Sensitive     CEFTRIAXONE <=0.25 SENSITIVE Sensitive     CIPROFLOXACIN >=4 RESISTANT Resistant     GENTAMICIN <=1 SENSITIVE Sensitive     IMIPENEM <=0.25 SENSITIVE Sensitive     NITROFURANTOIN <=16 SENSITIVE Sensitive     TRIMETH/SULFA >=320 RESISTANT Resistant     AMPICILLIN/SULBACTAM >=32 RESISTANT Resistant     PIP/TAZO <=4 SENSITIVE Sensitive     * >=100,000 COLONIES/mL ESCHERICHIA COLI      Radiology Studies: No results found.   Scheduled Meds: . Chlorhexidine Gluconate Cloth  6 each Topical Daily  . Chlorhexidine Gluconate Cloth  6 each Topical Daily  . dronabinol  2.5 mg Oral BID AC  . feeding supplement (ENSURE ENLIVE)  237 mL Oral BID BM  . insulin aspart  0-15 Units Subcutaneous TID WC  . insulin aspart  0-5 Units Subcutaneous QHS  . lactulose  20 g Oral BID  . latanoprost  1 drop Both Eyes QHS  . mirtazapine  15 mg Oral QHS  . multivitamin with minerals  1 tablet Oral Daily  . pantoprazole (PROTONIX) IV  40 mg Intravenous Q12H  . timolol  1 drop Both Eyes Daily   Continuous Infusions:    LOS: 7 days   Time Spent in minutes   45 minutes  Kianni Lheureux D.O. on 01/21/2020 at 1:52 PM  Between 7am to 7pm - Please see pager noted on [email or web address]  After 7pm go to [email or web address]  And look for the night coverage person covering for me after hours  [facility] Office  [phone]

## 2020-01-21 NOTE — Transfer of Care (Signed)
Immediate Anesthesia Transfer of Care Note  Patient: Amanda [name]  Procedure(s) Performed: ESOPHAGOGASTRODUODENOSCOPY (EGD) WITH PROPOFOL (N/A ) HOT HEMOSTASIS (ARGON PLASMA COAGULATION/BICAP) (N/A ) HEMOSTASIS CLIP PLACEMENT  Patient Location: PACU and Endoscopy Unit  Anesthesia Type:MAC  Level of Consciousness: awake and alert   Airway & Oxygen Therapy: Patient Spontanous Breathing  Post-op Assessment: Report given to RN and Post -op Vital signs reviewed and stable  Post vital signs: Reviewed and stable  Last Vitals:  Vitals Value Taken Time  BP    Temp    Pulse    Resp    SpO2      Last Pain:  Vitals:   01/21/20 0715  TempSrc: Oral  PainSc: 0-No pain         Complications: No apparent anesthesia complications

## 2020-01-22 DIAGNOSIS — R188 Other ascites: Secondary | ICD-10-CM

## 2020-01-22 DIAGNOSIS — K7460 Unspecified cirrhosis of liver: Secondary | ICD-10-CM

## 2020-01-22 DIAGNOSIS — K5521 Angiodysplasia of colon with hemorrhage: Secondary | ICD-10-CM

## 2020-01-22 LAB — BASIC METABOLIC PANEL
Anion gap: 9 (ref 5–15)
BUN: 13 mg/dL (ref 8–23)
CO2: 24 mmol/L (ref 22–32)
Calcium: 8.7 mg/dL — ABNORMAL LOW (ref 8.9–10.3)
Chloride: 102 mmol/L (ref 98–111)
Creatinine, Ser: 0.99 mg/dL (ref 0.44–1.00)
GFR calc Af Amer: >60 mL/min (ref >60)
GFR calc non Af Amer: 54 mL/min — ABNORMAL LOW (ref >60)
Glucose, Bld: 140 mg/dL — ABNORMAL HIGH (ref 70–99)
Potassium: 4 mmol/L (ref 3.5–5.1)
Sodium: 135 mmol/L (ref 135–145)

## 2020-01-22 LAB — HEMOGLOBIN AND HEMATOCRIT, BLOOD
HCT: 28.7 % — ABNORMAL LOW (ref 36.0–46.0)
Hemoglobin: 8.6 g/dL — ABNORMAL LOW (ref 12.0–15.0)

## 2020-01-22 LAB — MAGNESIUM: Magnesium: 2 mg/dL (ref 1.7–2.4)

## 2020-01-22 LAB — GLUCOSE, CAPILLARY
Glucose-Capillary: 158 mg/dL — ABNORMAL HIGH (ref 70–99)
Glucose-Capillary: 126 mg/dL — ABNORMAL HIGH (ref 70–99)
Glucose-Capillary: 125 mg/dL — ABNORMAL HIGH (ref 70–99)
Glucose-Capillary: 146 mg/dL — ABNORMAL HIGH (ref 70–99)

## 2020-01-22 MED ORDER — AMLODIPINE BESYLATE 5 MG PO TABS
5.0000 mg | ORAL_TABLET | Freq: Every day | ORAL | Status: DC
  Administered 2020-01-22 – 2020-01-23 (×2): 5 mg via ORAL
  Filled 2020-01-22 (×2): qty 1

## 2020-01-22 MED ORDER — FUROSEMIDE 40 MG PO TABS
40.0000 mg | ORAL_TABLET | Freq: Every day | ORAL | Status: DC
  Administered 2020-01-22 – 2020-01-23 (×2): 40 mg via ORAL
  Filled 2020-01-22 (×2): qty 1

## 2020-01-22 MED ORDER — POTASSIUM CHLORIDE CRYS ER 20 MEQ PO TBCR
20.0000 meq | EXTENDED_RELEASE_TABLET | Freq: Every day | ORAL | Status: DC
  Administered 2020-01-22 – 2020-01-23 (×2): 20 meq via ORAL
  Filled 2020-01-22 (×2): qty 1

## 2020-01-22 NOTE — Progress Notes (Signed)
Patient ID: Amanda [name], female   DOB: 1941/07/13, 79 y.o.   MRN: [id number]    Progress Note   Subjective  Day # 9  CC; GI bleed in setting of chronic Eliquis-history of Nash cirrhosis with portal vein thrombosis EGD yesterday-no esophageal varices, gastric mucosal atrophy with congestion likely secondary to portal hypertension, no gastric ulcers, 4 small AVMs in the duodenum treated with APC, 2 clips placed  Hemoglobin early a.m. 8.6, improved BUN 13/creatinine 0.99-normalized  Patient says she is feeling a little bit stronger today in general, was able to get up and walk with help.  She is eating without difficulty, has no complaints of abdominal discomfort or nausea.  Unaware about stools.  Husband at bedside, says plan is for her to go to [facility] rehab after discharge     Objective   Vital signs in last 24 hours: Temp:  [97.9 F (36.6 C)-98.4 F (36.9 C)] 97.9 F (36.6 C) (04/06 0542) Pulse Rate:  [96-105] 105 (04/06 0542) Resp:  [16-18] 16 (04/06 0542) BP: (116-160)/(59-66) 160/63 (04/06 0542) SpO2:  [93 %-95 %] 93 % (04/06 0542) Last BM Date: 01/20/20 General:    Elderly white female in NAD Heart:  Regular rate and rhythm; no murmurs Lungs: Respirations even and unlabored, lungs CTA bilaterally Abdomen:  Soft, obese nontender and nondistended. Normal bowel sounds. Extremities:  Without edema. Neurologic:  Alert and oriented,  grossly normal neurologically. Psych:  Cooperative. Normal mood and affect.,  Mentating well, no asterixis  Intake/Output from previous day: 04/05 0701 - 04/06 0700 In: 490 [P.O.:240; I.V.:250] Out: 150 [Urine:150] Intake/Output this shift: No intake/output data recorded.  Lab Results: Recent Labs    01/20/20 0758 01/21/20 0410 01/22/20 0437  WBC  --  27.2*  --   HGB 8.4* 7.8* 8.6*  HCT 27.0* 26.0* 28.7*  PLT  --  242  --    BMET Recent Labs    01/21/20 0410 01/22/20 0437  NA 136 135  K 3.8 4.0  CL 100 102  CO2 26 24    GLUCOSE 135* 140*  BUN 13 13  CREATININE 0.90 0.99  CALCIUM 8.5* 8.7*   LFT No results for input(s): PROT, ALBUMIN, AST, ALT, ALKPHOS, BILITOT, BILIDIR, IBILI in the last 72 hours. PT/INR No results for input(s): LABPROT, INR in the last 72 hours.  Studies/Results: No results found.     Assessment / Plan:    #1 complicated elderly white female with decompensated Nash cirrhosis.  Recent hospitalization with new finding of portal vein thrombus , hepatic vein thrombus ,and started on chronic anticoagulation with Eliquis.  She has underlying myeloproliferative disorder.  she also had pneumonia during that admission and encephalopathy. Currently admitted 01/13/2020 with failure to thrive, with ongoing intermittent nausea and vomiting  Found to have UTI on admission, on antibiotics  Generalized deconditioning-plan for inpatient rehab stay on discharge as becoming increasingly difficult for her husband to care for her at home  Heme positive stool with drift in hemoglobin-status post EGD with APC and clipping of duodenal AVMs yesterday and also noted to have portal gastropathy. No evidence for active bleeding and hemoglobin improved today.   Plan; continue twice daily PPI Eliquis has been on hold-probably okay to resume and monitor hemoglobin closely. Nausea has improved with Marinol Continue twice daily lactulose Restart Lasix 40 mg p.o. daily,, K. Dur 20 mEq daily and monitor renal function-She has not been on spironolactone over the past several months after hospitalization in October with acute kidney  injury.   Principal Problem:   Failure to thrive in adult Active Problems:   Cirrhosis ([place])   Type 2 diabetes mellitus with diabetic polyneuropathy (HCC)   CKD (chronic kidney disease), stage III   Portal vein thrombosis   Nausea & vomiting   Myeloproliferative disorder (HCC)   Goals of care, counseling/discussion   Palliative care by specialist   Malnutrition of moderate  degree   AVM (arteriovenous malformation) of small bowel, acquired with hemorrhage     LOS: 8 days   Cordero Surette EsterwoodPA-C  01/22/2020, 12:25 PM

## 2020-01-22 NOTE — Progress Notes (Signed)
Nutrition Follow-up  DOCUMENTATION CODES:   Non-severe (moderate) malnutrition in context of chronic illness  INTERVENTION:   -Continue Ensure Enlive po BID, each supplement provides 350 kcal and 20 grams of protein -Continue Magic cup TID with meals, each supplement provides 290 kcal and 9 grams of protein -Continue MVI with minerals daily  NUTRITION DIAGNOSIS:   Moderate Malnutrition related to chronic illness(cirrhosis) as evidenced by moderate muscle depletion, mild muscle depletion, energy intake < 75% for > or equal to 1 month.  Ongoing  GOAL:   Patient will meet greater than or equal to 90% of their needs  Progressing   MONITOR:   PO intake, Supplement acceptance, Labs, Weight trends, Skin, I & O's  REASON FOR ASSESSMENT:   Consult Assessment of nutrition requirement/status  ASSESSMENT:   Patient with PMH significant for CAD, DM, myeloproliferative disorder, decompensated liver cirrhosis, CKD III, portal vein vein thrombosis, and recent admission for colitis. Presents this admission with failure to thrive.  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 RD did not disturb.   Intake has improved since last visit; noted meal completion documented at 50-100% of meals. Pt consuming 1-2 Ensure supplements per day.   Medications reviewed and include lactulose, remeron, and marinol.  Per TOC team notes, pt with bed at SNF [facility]) once medically stable for discharge.   Labs reviewed:  CBGS: 119-158 (inpatient orders for glycemic control are 0-5 units insulin aspart q HS).   Diet Order:   Diet Order            Diet heart healthy/carb modified Room service appropriate? Yes; Fluid consistency: Thin  Diet effective now              EDUCATION NEEDS:   Education needs have been addressed  Skin:  Skin Assessment: Skin Integrity Issues: Skin Integrity Issues:: Other (Comment) Other: MASD to perineum, skin tear to rt buttocks  Last BM:  01/20/20  Height:   Ht Readings from Last 1 Encounters:  12/28/19 5' 4.5" (1.638 m)    Weight:   Wt Readings from Last 1 Encounters:  01/18/20 98.1 kg   BMI:  Body mass index is 36.55 kg/m.  Estimated Nutritional Needs:   Kcal:  2000-2200 kcal  Protein:  100-120 grams  Fluid:  >/= 2 L/day    Loistine Chance, RD, LDN, [place] Registered Dietitian II Certified Diabetes Care and Education Specialist Please refer to [facility] for RD and/or RD on-call/weekend/after hours pager

## 2020-01-22 NOTE — Progress Notes (Signed)
PROGRESS NOTE    Amanda [name]  PJA:[id number] DOB: Mar 14, 1941 DOA: 01/13/2020 PCP: [name], NP   Brief Narrative:  HPI On 01/13/2020 by Dr. Mitzi Hansen Amanda [name] is a 79 y.o. 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  Since recent hospitalization, the patient has remained in bed most of the time, not eating or drinking much, and not taking her medications. Symptoms wax and wane, she is able to tolerate small bites and sips sometimes. She denies fever or chills. SNF had been recommended by PT and physicians prior to the recent discharge but patient wanted to go home and her husband thought he would be able to care for her there. She now wants to go a nursing home. She had a visit with her PCP 2 days ago and it was advised that she return to the hospital in order to be placed in a nursing facility.   Interim history Failure to thrive.  She essentially is bedbound but has ongoing nausea and loss of appetite.  Also has decompensated cirrhosis.  TOC consulted.  Palliative care consulted. Currently has UTI, being treated with Merrem for a total of 5 days. Now with GIB, gastroenterology consulted, s/p EGD.  Assessment & Plan   Failure to thrive/ Moderate malnutrition  -Patient presented from home where she essentially is bedbound with ongoing nausea and loss of appetite might have been replaced at a nursing facility -CT abdomen without acute abnormality.  Patient does have cirrhosis with portal hypertension, splenomegaly, small volume ascites, nonocclusive portal vein thrombosis.  Small left pleural effusion.  Hypodensity in the left urinary bladder, possible small bladder stone.  Abnormal  endometrial thickening. -CT chest without acute pulmonary process.  Positive for pulmonary hypertension, findings of cirrhosis with sequela of portal hypertension -Continue antiemetics as needed -Palliative care consulted and appreciated, continue supportive care -Continue Marinol for decreased appetite -Nutrition consulted, continue supplements  E. Coli/ Acinetobacter UTI -Initially was placed on ceftriaxone -Urine culture showed>100k E. coli and acinetobacter calcoaceticus/baumanii complex -Both sensitive to Zosyn however patient with penicillin allergy therefore placed on Merrem (starting on 3/31) -Discussed with Dr. Megan Salon, ID, via phone, given that patient was symptomatic, would continue to treat for 5 days total -Completed Merrem therapy  GI bleed/acute on chronic normocytic anemia -hemoglobin has trended downward over the past few days -FOBT + -EGD done 05/26/2018: Gastritis, no active bleeding.  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  Could be because of symptoms in the setting of anticoagulation.  Treated with APC (argon plasma coagulation).  2 clips were placed.  Recommendations were to hold Eliquis for now and to repeat hemoglobin and transfuse as needed. -Hemoglobin 8.6 today -pending further recommendations from GI regarding when to restart Eliquis  Decompensated cirrhosis  -Continue PPI continue lactulose -Diuretic held given recent loss of appetite with nausea and vomiting -Continue to monitor LFTs, bilirubin, platelets and albumin  Diabetes mellitus, type II -Hemoglobin A1c was 5.1 earlier this month -Continue insulin sliding scale and CBG monitoring   Chronic kidney disease,  stage IIIa -Creatinine appears to  be at baseline  Myeloproliferative disorder -Patient with chronic leukocytosis -Procalcitonin 0.22, not clearly suggestive of infection -Will continue to treat UTI as above -Oncology, Dr. [name], consulted and appreciated, recommended holding jakafi  Portal vein thrombosis -Eliquis held  Coronary artery disease -No complaints of chest pain at this time -Eliquis held  Essential hypertension -Lasix held  -Continue amlodipine  Glaucoma -continue eyedrops  Hypokalemia/hypomagnesemia -Resolved with replacement, continue to monitor and replace as needed  DVT Prophylaxis Eliquis held, SCDs ordered  Code Status: DNR  Family Communication: None at bedside.  Disposition Plan: Admitted from home.  Pending SNF placement as family is unable to care for her at this time and patient has had poor oral intake. Now with GIB. Suspect discharge to SNF in 48-72 hours. Will also need repeat COVID test close to time of discharge.  Consultants Oncology Palliative care ID, Dr. Megan Salon, via phone Gastroenterology  Procedures  EGD  Antibiotics   Anti-infectives (From admission, onward)   Start     Dose/Rate Route Frequency Ordered Stop   01/16/20 1500  meropenem (MERREM) 1 g in sodium chloride 0.9 % 100 mL IVPB  Status:  Discontinued     1 g 200 mL/hr over 30 Minutes Intravenous Every 12 hours 01/16/20 1349 01/21/20 1127   01/14/20 1700  cefTRIAXone (ROCEPHIN) 1 g in sodium chloride 0.9 % 100 mL IVPB  Status:  Discontinued     1 g 200 mL/hr over 30 Minutes Intravenous Every 24 hours 01/14/20 1651 01/16/20 1330      Subjective:   Arliene Rosenow seen and examined today.  No complaints today. Denies current chest pain, shortness of breath, abdominal pain, N/V/D/C, dizziness, headache. States she is not a "breakfast person".  Objective:   Vitals:   01/21/20 0930 01/21/20 1400 01/21/20 2117 01/22/20 0542  BP: (!) 150/57 116/66 (!) 116/59 (!) 160/63  Pulse: 99 96 (!) 101 (!) 105  Resp:  19 18 18 16   Temp:  98.2 F (36.8 C) 98.4 F (36.9 C) 97.9 F (36.6 C)  TempSrc:  Oral Oral Oral  SpO2: 92% 95% 93% 93%  Weight:        Intake/Output Summary (Last 24 hours) at 01/22/2020 0906 Last data filed at 01/22/2020 0543 Gross per 24 hour  Intake 240 ml  Output 150 ml  Net 90 ml   Filed Weights   01/15/20 0500 01/16/20 0500 01/18/20 0352  Weight: 97 kg 95.6 kg 98.1 kg   Exam  General: Well developed, chronically ill appearing, NAD  HEENT: NCAT, Pmucous membranes moist.   Cardiovascular: S1 S2 auscultated, RRR  Respiratory: Clear to auscultation bilaterally   Abdomen: Soft, nontender, nondistended, + bowel sounds  Extremities: warm dry without cyanosis clubbing or edema  Neuro: AAOx3, nonfocal  Psych: Pleasant, appropriate mood and affect  Data Reviewed: I have personally reviewed following labs and imaging studies  CBC: Recent Labs  Lab 01/15/20 1622 01/15/20 1622 01/16/20 0158 01/16/20 0158 01/17/20 0454 01/19/20 0352 01/20/20 0758 01/21/20 0410 01/22/20 0437  WBC 21.7*  --  15.6*  --  21.8* 22.5*  --  27.2*  --   NEUTROABS  --   --  13.1*  --   --   --   --   --   --   HGB 10.7*   < > 9.2*   < > 9.8* 8.8* 8.4* 7.8* 8.6*  HCT 33.5*   < > 29.3*   < > 31.3* 27.8* 27.0* 26.0*  28.7*  MCV 91.5  --  92.1  --  91.8 93.9  --  97.7  --   PLT 172  --  154  --  216 213  --  242  --    < > = values in this interval not displayed.   Basic Metabolic Panel: Recent Labs  Lab 01/16/20 0158 01/17/20 0454 01/19/20 0352 01/21/20 0410 01/22/20 0437  NA 133* 136 133* 136 135  K 3.0* 3.5 3.6 3.8 4.0  CL 100 100 99 100 102  CO2 23 24 25 26 24   GLUCOSE 110* 160* 168* 135* 140*  BUN 8 11 15 13 13   CREATININE 1.08* 1.12* 0.90 0.90 0.99  CALCIUM 8.2* 8.4* 8.3* 8.5* 8.7*  MG 2.0 1.9  --   --  2.0  PHOS 2.4*  --   --   --   --    GFR: Estimated Creatinine Clearance: 52.1 mL/min (by C-G formula based on SCr of 0.99 mg/dL). Liver Function Tests: Recent Labs    Lab 01/16/20 0158  AST 19  ALT 8  ALKPHOS 75  BILITOT 0.9  PROT 4.7*  ALBUMIN 2.5*   No results for input(s): LIPASE, AMYLASE in the last 168 hours. No results for input(s): AMMONIA in the last 168 hours. Coagulation Profile: No results for input(s): INR, PROTIME in the last 168 hours. Cardiac Enzymes: No results for input(s): CKTOTAL, CKMB, CKMBINDEX, TROPONINI in the last 168 hours. BNP (last 3 results) No results for input(s): PROBNP in the last 8760 hours. HbA1C: No results for input(s): HGBA1C in the last 72 hours. CBG: Recent Labs  Lab 01/20/20 2158 01/21/20 1144 01/21/20 1632 01/21/20 2114 01/22/20 0800  GLUCAP 109* 143* 121* 119* 158*   Lipid Profile: No results for input(s): CHOL, HDL, LDLCALC, TRIG, CHOLHDL, LDLDIRECT in the last 72 hours. Thyroid Function Tests: No results for input(s): TSH, T4TOTAL, FREET4, T3FREE, THYROIDAB in the last 72 hours. Anemia Panel: No results for input(s): VITAMINB12, FOLATE, FERRITIN, TIBC, IRON, RETICCTPCT in the last 72 hours. Urine analysis:    Component Value Date/Time   COLORURINE YELLOW 01/14/2020 0821   APPEARANCEUR CLEAR 01/14/2020 0821   LABSPEC <1.005 (L) 01/14/2020 0821   PHURINE 5.0 01/14/2020 0821   GLUCOSEU NEGATIVE 01/14/2020 0821   GLUCOSEU NEGATIVE 01/10/2018 1217   HGBUR SMALL (A) 01/14/2020 0821   HGBUR large 05/07/2010 0845   BILIRUBINUR MODERATE (A) 01/14/2020 0821   BILIRUBINUR neg 01/26/2016 1003   KETONESUR NEGATIVE 01/14/2020 0821   PROTEINUR NEGATIVE 01/14/2020 0821   UROBILINOGEN 0.2 01/10/2018 1217   NITRITE POSITIVE (A) 01/14/2020 0821   LEUKOCYTESUR NEGATIVE 01/14/2020 0821   Sepsis Labs: @LABRCNTIP (procalcitonin:4,lacticidven:4)  ) Recent Results (from the past 240 hour(s))  SARS CORONAVIRUS 2 (TAT 6-24 HRS) Nasopharyngeal Nasopharyngeal Swab     Status: None   Collection Time: 01/13/20  4:00 AM   Specimen: Nasopharyngeal Swab  Result Value Ref Range Status   SARS Coronavirus 2  NEGATIVE NEGATIVE Final    Comment: (NOTE) SARS-CoV-2 target nucleic acids are NOT DETECTED. The SARS-CoV-2 RNA is generally detectable in upper and lower respiratory specimens during the acute phase of infection. Negative results do not preclude SARS-CoV-2 infection, do not rule out co-infections with other pathogens, and should not be used as the sole basis for treatment or other patient management decisions. Negative results must be combined with clinical observations, patient history, and epidemiological information. The expected result is Negative. Fact Sheet for Patients: SugarRoll.be Fact Sheet for Healthcare Providers: https://www.woods-mathews.com/ This test  is not yet approved or cleared by the [place] and  has been authorized for detection and/or diagnosis of SARS-CoV-2 by FDA under an Emergency Use Authorization (EUA). This EUA will remain  in effect (meaning this test can be used) for the duration of the COVID-19 declaration under Section 56 4(b)(1) of the Act, 21 U.S.C. section 360bbb-3(b)(1), unless the authorization is terminated or revoked sooner. Performed at [facility], [place] [address], [place], [place] [address]   Culture, Urine     Status: Abnormal   Collection Time: 01/14/20  4:55 PM   Specimen: Urine, Random  Result Value Ref Range Status   Specimen Description URINE, RANDOM  Final   Special Requests   Final    NONE Performed at [facility], [place] [address] [place], [place] [address]    Culture (A)  Final    >=100,000 COLONIES/mL ACINETOBACTER CALCOACETICUS/BAUMANNII COMPLEX >=100,000 COLONIES/mL ESCHERICHIA COLI    Report Status 01/16/2020 FINAL  Final   Organism ID, Bacteria ACINETOBACTER CALCOACETICUS/BAUMANNII COMPLEX (A)  Final   Organism ID, Bacteria ESCHERICHIA COLI (A)  Final      Susceptibility   Acinetobacter calcoaceticus/baumannii complex - MIC*    CEFTAZIDIME 4 SENSITIVE  Sensitive     CIPROFLOXACIN <=0.25 SENSITIVE Sensitive     GENTAMICIN <=1 SENSITIVE Sensitive     IMIPENEM <=0.25 SENSITIVE Sensitive     PIP/TAZO <=4 SENSITIVE Sensitive     TRIMETH/SULFA <=20 SENSITIVE Sensitive     AMPICILLIN/SULBACTAM <=2 SENSITIVE Sensitive     * >=100,000 COLONIES/mL ACINETOBACTER CALCOACETICUS/BAUMANNII COMPLEX   Escherichia coli - MIC*    AMPICILLIN >=32 RESISTANT Resistant     CEFAZOLIN <=4 SENSITIVE Sensitive     CEFTRIAXONE <=0.25 SENSITIVE Sensitive     CIPROFLOXACIN >=4 RESISTANT Resistant     GENTAMICIN <=1 SENSITIVE Sensitive     IMIPENEM <=0.25 SENSITIVE Sensitive     NITROFURANTOIN <=16 SENSITIVE Sensitive     TRIMETH/SULFA >=320 RESISTANT Resistant     AMPICILLIN/SULBACTAM >=32 RESISTANT Resistant     PIP/TAZO <=4 SENSITIVE Sensitive     * >=100,000 COLONIES/mL ESCHERICHIA COLI      Radiology Studies: No results found.   Scheduled Meds: . Chlorhexidine Gluconate Cloth  6 each Topical Daily  . Chlorhexidine Gluconate Cloth  6 each Topical Daily  . dronabinol  2.5 mg Oral BID AC  . feeding supplement (ENSURE ENLIVE)  237 mL Oral BID BM  . insulin aspart  0-15 Units Subcutaneous TID WC  . insulin aspart  0-5 Units Subcutaneous QHS  . lactulose  20 g Oral BID  . latanoprost  1 drop Both Eyes QHS  . mirtazapine  15 mg Oral QHS  . multivitamin with minerals  1 tablet Oral Daily  . pantoprazole (PROTONIX) IV  40 mg Intravenous Q12H  . timolol  1 drop Both Eyes Daily   Continuous Infusions:    LOS: 8 days   Time Spent in minutes   30 minutes  Jullian Previti D.O. on 01/22/2020 at 9:06 AM  Between 7am to 7pm - Please see pager noted on [email or web address]  After 7pm go to [email or web address]  And look for the night coverage person covering for me after hours  [facility] Office  [phone]

## 2020-01-23 ENCOUNTER — Telehealth: Payer: Self-pay

## 2020-01-23 DIAGNOSIS — R531 Weakness: Secondary | ICD-10-CM

## 2020-01-23 LAB — HEMOGLOBIN AND HEMATOCRIT, BLOOD
HCT: 27.3 % — ABNORMAL LOW (ref 36.0–46.0)
Hemoglobin: 8 g/dL — ABNORMAL LOW (ref 12.0–15.0)

## 2020-01-23 LAB — GLUCOSE, CAPILLARY
Glucose-Capillary: 124 mg/dL — ABNORMAL HIGH (ref 70–99)
Glucose-Capillary: 146 mg/dL — ABNORMAL HIGH (ref 70–99)
Glucose-Capillary: 147 mg/dL — ABNORMAL HIGH (ref 70–99)
Glucose-Capillary: 132 mg/dL — ABNORMAL HIGH (ref 70–99)

## 2020-01-23 LAB — CBC
HCT: 27.6 % — ABNORMAL LOW (ref 36.0–46.0)
Hemoglobin: 8.1 g/dL — ABNORMAL LOW (ref 12.0–15.0)
MCH: 28.8 pg (ref 26.0–34.0)
MCHC: 29.3 g/dL — ABNORMAL LOW (ref 30.0–36.0)
MCV: 98.2 fL (ref 80.0–100.0)
Platelets: 319 10*3/uL (ref 150–400)
RBC: 2.81 MIL/uL — ABNORMAL LOW (ref 3.87–5.11)
RDW: 22 % — ABNORMAL HIGH (ref 11.5–15.5)
WBC: 34.3 10*3/uL — ABNORMAL HIGH (ref 4.0–10.5)
nRBC: 1.2 % — ABNORMAL HIGH (ref 0.0–0.2)

## 2020-01-23 LAB — COMPREHENSIVE METABOLIC PANEL
ALT: 12 U/L (ref 0–44)
AST: 31 U/L (ref 15–41)
Albumin: 2.3 g/dL — ABNORMAL LOW (ref 3.5–5.0)
Alkaline Phosphatase: 112 U/L (ref 38–126)
Anion gap: 8 (ref 5–15)
BUN: 12 mg/dL (ref 8–23)
CO2: 25 mmol/L (ref 22–32)
Calcium: 8.5 mg/dL — ABNORMAL LOW (ref 8.9–10.3)
Chloride: 103 mmol/L (ref 98–111)
Creatinine, Ser: 1.19 mg/dL — ABNORMAL HIGH (ref 0.44–1.00)
GFR calc Af Amer: 50 mL/min — ABNORMAL LOW (ref >60)
GFR calc non Af Amer: 43 mL/min — ABNORMAL LOW (ref >60)
Glucose, Bld: 141 mg/dL — ABNORMAL HIGH (ref 70–99)
Potassium: 4 mmol/L (ref 3.5–5.1)
Sodium: 136 mmol/L (ref 135–145)
Total Bilirubin: 1.4 mg/dL — ABNORMAL HIGH (ref 0.3–1.2)
Total Protein: 4.8 g/dL — ABNORMAL LOW (ref 6.5–8.1)

## 2020-01-23 LAB — SARS CORONAVIRUS 2 (TAT 6-24 HRS): SARS Coronavirus 2: NEGATIVE

## 2020-01-23 MED ORDER — SPIRONOLACTONE 12.5 MG HALF TABLET
12.5000 mg | ORAL_TABLET | Freq: Every day | ORAL | Status: DC
  Administered 2020-01-24 – 2020-01-25 (×2): 12.5 mg via ORAL
  Filled 2020-01-23 (×2): qty 1

## 2020-01-23 MED ORDER — APIXABAN 5 MG PO TABS
5.0000 mg | ORAL_TABLET | Freq: Two times a day (BID) | ORAL | Status: DC
  Administered 2020-01-23 – 2020-01-25 (×4): 5 mg via ORAL
  Filled 2020-01-23 (×4): qty 1

## 2020-01-23 MED ORDER — FUROSEMIDE 40 MG PO TABS
40.0000 mg | ORAL_TABLET | Freq: Every day | ORAL | Status: DC
  Administered 2020-01-24 – 2020-01-25 (×2): 40 mg via ORAL
  Filled 2020-01-23 (×2): qty 1

## 2020-01-23 NOTE — TOC Progression Note (Signed)
Transition of Care [facility]) - Progression Note    Patient Details  Name: Amanda [name] MRN: [id number] Date of Birth: [birth year]  Transition of Care [facility]) CM/SW Ackerman, M[place] Phone Number: 01/23/2020, 1:09 PM  Clinical Narrative:    CSW spoke w/ pt husband in hallway. We discussed that Kathleen Argue is still following for admission and that we are working on authorization. Pt husband would like for [place] to reinforce that visitation is limited to pt but that he will do what he can to see her until quarantine period over. Pt husband very grateful for care team here at hospital.    Pt auth was valid through 4/7, will need re-auth when ready, have sent additional clinicals to [facility] with [place] and have alerted her of pt care plan. Also messaged the MD requesting new COVID if pt stable within 24-48hrs. Continue to work to support disposition when pt medically stable.    Expected Discharge Plan: Skilled Nursing Facility Barriers to Discharge: Continued Medical Work up  Expected Discharge Plan and Services Expected Discharge Plan: [place] In-house Referral: Clinical Social Work Discharge Planning Services: CM Consult Post Acute Care Choice: [place] Living arrangements for the past 2 months: Single Family Home    Readmission Risk Interventions Readmission Risk Prevention Plan 01/17/2020  Transportation Screening Complete  PCP or Specialist Appt within 3-5 Days Not Complete  Not Complete comments plan for SNF  HRI or Home Care Consult Complete  Social Work Consult for [place] Planning/Counseling Complete  Palliative Care Screening Complete  Medication Review Press photographer) Referral to Pharmacy  Some recent data might be hidden

## 2020-01-23 NOTE — Progress Notes (Signed)
ANTICOAGULATION CONSULT NOTE - Initial Consult  Pharmacy Consult :  Restart Apixaban tonight  Indication:  H/o recent portal vein thrombosis.  Allergies  Allergen Reactions  . Doxycycline Hives, Swelling and Rash  . Penicillins Swelling    Has patient had a PCN reaction causing immediate rash, facial/tongue/throat swelling, SOB or lightheadedness with hypotension: No Has patient had a PCN reaction causing severe rash involving mucus membranes or skin necrosis: No Has patient had a PCN reaction that required hospitalization: No Has patient had a PCN reaction occurring within the last 10 years: No If all of the above answers are "NO", then may proceed with Cephalosporin use.  Marland Kitchen Lisinopril Cough  . Tape Hives  . Latex Hives, Itching and Rash    Patient Measurements: Weight: 98.1 kg (216 lb 4.3 oz)   Vital Signs: Temp: 98.1 F (36.7 C) (04/07 0333) Temp Source: Oral (04/07 0333) BP: 127/63 (04/07 0333) Pulse Rate: 96 (04/07 0333)  Labs: Recent Labs    01/21/20 0410 01/21/20 0410 01/22/20 0437 01/23/20 0453  HGB 7.8*   < > 8.6* 8.0*  HCT 26.0*  --  28.7* 27.3*  PLT 242  --   --   --   CREATININE 0.90  --  0.99 1.19*   < > = values in this interval not displayed.    Estimated Creatinine Clearance: 43.3 mL/min (A) (by C-G formula based on SCr of 1.19 mg/dL (H)).   Medical History: Past Medical History:  Diagnosis Date  . Allergic rhinitis 01/23/2016  . C. difficile colitis   . CAD (coronary artery disease)    a. s/p STEMI in 01/2015 with 95% LCx stenosis and distal 80% LCx stenosis (DESx2 placed)  . Cancer ([place])    SKIN  . Candidiasis of skin 09/30/2014  . Cirrhosis ([place])   . Depression   . Diabetes mellitus 2008  . Gout   . Herpes simplex   . Hyperglycemia 05/31/2013  . Hyperlipidemia   . Hypertension   . Myocardial infarction ([place])   . 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 Obesity   . Polycythemia    Dr. Elease Hashimoto- [facility]  . Psoriasis     Assessment: 79 y.o female on Eliquis 36m BID prior to admission for recent diagnosis of portal vein thrombosis on previous hospitalization 12/28/19-01/02/20.   Eliquis held on this admit due to Hgb drop , GI bleed.  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   Pharmacy consulted to restart Eliquis tonight.  GI recommended close monitoring of blood counts, and if she doesn't tolerate it with recurrent bleeding, may need to forgo further anticoagulation as risks may outweigh benefits.    Goal of Therapy:  Monitor platelets by anticoagulation protocol: Yes   Plan:  Resume pta Apixaban 5 mg BID tonight. Monitor Hgb closely, monior for signs and symptoms of bleeding   RNicole Cella RPh Clinical Pharmacist  Please check AMION for all [place] numbers After 10:00 PM, call [phone] 01/23/2020,12:37 PM

## 2020-01-23 NOTE — Progress Notes (Signed)
PROGRESS NOTE    Amanda [name]  HWT:[id number] DOB: 09/29/1941 DOA: 01/13/2020 PCP: [name], NP   Brief Narrative:  HPI On 01/13/2020 by Dr. Mitzi Hansen Amanda [name] is a 79 y.o. 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  Since recent hospitalization, the patient has remained in bed most of the time, not eating or drinking much, and not taking her medications. Symptoms wax and wane, she is able to tolerate small bites and sips sometimes. She denies fever or chills. SNF had been recommended by PT and physicians prior to the recent discharge but patient wanted to go home and her husband thought he would be able to care for her there. She now wants to go a nursing home. She had a visit with her PCP 2 days ago and it was advised that she return to the hospital in order to be placed in a nursing facility.   Interim history Failure to thrive.  She essentially is bedbound but has ongoing nausea and loss of appetite.  Also has decompensated cirrhosis.  TOC consulted.  Palliative care consulted. Currently has UTI, being treated with Merrem for a total of 5 days. Now with GIB, gastroenterology consulted, s/p EGD.  Assessment & Plan   Failure to thrive/ Moderate malnutrition  -Patient presented from home where she essentially is bedbound with ongoing nausea and loss of appetite might have been replaced at a nursing facility -CT abdomen without acute abnormality.  Patient does have cirrhosis with portal hypertension, splenomegaly, small volume ascites, nonocclusive portal vein thrombosis.  Small left pleural effusion.  Hypodensity in the left urinary bladder, possible small bladder stone.  Abnormal  endometrial thickening. -CT chest without acute pulmonary process.  Positive for pulmonary hypertension, findings of cirrhosis with sequela of portal hypertension -Continue antiemetics as needed -Palliative care consulted and appreciated, continue supportive care -Continue Marinol for decreased appetite -Nutrition consulted, continue supplements  E. Coli/ Acinetobacter UTI -Initially was placed on ceftriaxone -Urine culture showed>100k E. coli and acinetobacter calcoaceticus/baumanii complex -Both sensitive to Zosyn however patient with penicillin allergy therefore placed on Merrem (starting on 3/31) -Discussed with Dr. Megan Salon, ID, via phone, given that patient was symptomatic, would continue to treat for 5 days total -Completed Merrem therapy  GI bleed/acute on chronic normocytic anemia -hemoglobin has trended downward over the past few days -FOBT + -EGD done 05/26/2018: Gastritis, no active bleeding.  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  Could be because of symptoms in the setting of anticoagulation.  Treated with APC (argon plasma coagulation).  2 clips were placed.  Recommendations were to hold Eliquis for now and to repeat hemoglobin and transfuse as needed. -Hemoglobin stable.  Resume Eliquis.  Decompensated cirrhosis  -Continue PPI continue lactulose -Diuretic held given recent loss of appetite with nausea and vomiting -Continue to monitor LFTs, bilirubin, platelets and albumin Resume Lasix tomorrow.  Also added Aldactone.  Unna boots.  Diabetes mellitus, type II -Hemoglobin A1c was 5.1 earlier this month -Continue insulin sliding scale and CBG monitoring   Chronic  kidney disease, stage IIIa -Creatinine appears to  be at baseline  Myeloproliferative disorder -Patient with chronic leukocytosis -Procalcitonin 0.22, not clearly suggestive of infection -Will continue to treat UTI as above -Oncology, Dr. [name], consulted and appreciated, recommended holding jakafi  Portal vein thrombosis -Eliquis resumed on 01/23/2020.  Coronary artery disease -No complaints of chest pain at this time -Eliquis resumed on 01/23/2020.  Essential hypertension -Lasix held  -Continue amlodipine  Glaucoma -continue eyedrops  Hypokalemia/hypomagnesemia -Resolved with replacement, continue to monitor and replace as needed  Hypoalbuminemia. Third spacing. Unna boots ordered.  DVT Prophylaxis Eliquis resumed  Code Status: DNR  Family Communication: None at bedside.  Disposition Plan: Admitted from home.  Pending SNF placement as family is unable to care for her at this time and patient has had poor oral intake. Now with GIB. Suspect discharge to SNF in 48-72 hours. Will also need repeat COVID test close to time of discharge.  Consultants Oncology Palliative care ID, Dr. Megan Salon, via phone Gastroenterology  Procedures  EGD  Antibiotics   Anti-infectives (From admission, onward)   Start     Dose/Rate Route Frequency Ordered Stop   01/16/20 1500  meropenem (MERREM) 1 g in sodium chloride 0.9 % 100 mL IVPB  Status:  Discontinued     1 g 200 mL/hr over 30 Minutes Intravenous Every 12 hours 01/16/20 1349 01/21/20 1127   01/14/20 1700  cefTRIAXone (ROCEPHIN) 1 g in sodium chloride 0.9 % 100 mL IVPB  Status:  Discontinued     1 g 200 mL/hr over 30 Minutes Intravenous Every 24 hours 01/14/20 1651 01/16/20 1330      Subjective:   [name] seen and examined today.  No acute complaints no nausea no vomiting.  Reports fatigue and tiredness.  No blood in the stool.  Objective:   Vitals:   01/22/20 0542 01/22/20 2214 01/23/20 0333 01/23/20 1423  BP: (!) 160/63 (!) 124/57 127/63 (!) 141/57  Pulse: (!) 105  97 96 85  Resp: 16 17 18 18   Temp: 97.9 F (36.6 C) 99.3 F (37.4 C) 98.1 F (36.7 C) 97.7 F (36.5 C)  TempSrc: Oral Oral Oral Oral  SpO2: 93% 95% 96% 96%  Weight:        Intake/Output Summary (Last 24 hours) at 01/23/2020 1920 Last data filed at 01/23/2020 1827 Gross per 24 hour  Intake 230 ml  Output 725 ml  Net -495 ml   Filed Weights   01/15/20 0500 01/16/20 0500 01/18/20 0352  Weight: 97 kg 95.6 kg 98.1 kg   Exam  General: Well developed, chronically ill appearing, NAD  HEENT: NCAT, Pmucous membranes moist.   Cardiovascular: S1 S2 auscultated, RRR  Respiratory: Clear to auscultation bilaterally   Abdomen: Soft, nontender, nondistended, + bowel sounds  Extremities: warm dry without cyanosis clubbing or edema  Neuro: AAOx3, nonfocal  Psych: Pleasant, appropriate mood and affect  Data Reviewed: I have personally reviewed following labs and imaging studies  CBC: Recent Labs  Lab 01/17/20 0454 01/17/20 0454 01/19/20 0352 01/20/20 0758 01/21/20 0410 01/22/20 0437 01/23/20 0453  WBC 21.8*  --  22.5*  --  27.2*  --   --   HGB 9.8*   < > 8.8* 8.4* 7.8* 8.6* 8.0*  HCT 31.3*   < > 27.8* 27.0* 26.0* 28.7* 27.3*  MCV 91.8  --  93.9  --  97.7  --   --   PLT 216  --  213  --  242  --   --    < > =  values in this interval not displayed.   Basic Metabolic Panel: Recent Labs  Lab 01/17/20 0454 01/19/20 0352 01/21/20 0410 01/22/20 0437 01/23/20 0453  NA 136 133* 136 135 136  K 3.5 3.6 3.8 4.0 4.0  CL 100 99 100 102 103  CO2 24 25 26 24 25   GLUCOSE 160* 168* 135* 140* 141*  BUN 11 15 13 13 12   CREATININE 1.12* 0.90 0.90 0.99 1.19*  CALCIUM 8.4* 8.3* 8.5* 8.7* 8.5*  MG 1.9  --   --  2.0  --    GFR: Estimated Creatinine Clearance: 43.3 mL/min (A) (by C-G formula based on SCr of 1.19 mg/dL (H)). Liver Function Tests: Recent Labs  Lab 01/23/20 0453  AST 31  ALT 12  ALKPHOS 112  BILITOT 1.4*  PROT 4.8*  ALBUMIN 2.3*   No results for input(s):  LIPASE, AMYLASE in the last 168 hours. No results for input(s): AMMONIA in the last 168 hours. Coagulation Profile: No results for input(s): INR, PROTIME in the last 168 hours. Cardiac Enzymes: No results for input(s): CKTOTAL, CKMB, CKMBINDEX, TROPONINI in the last 168 hours. BNP (last 3 results) No results for input(s): PROBNP in the last 8760 hours. HbA1C: No results for input(s): HGBA1C in the last 72 hours. CBG: Recent Labs  Lab 01/22/20 1605 01/22/20 2210 01/23/20 0756 01/23/20 1201 01/23/20 1647  GLUCAP 125* 146* 124* 146* 147*   Lipid Profile: No results for input(s): CHOL, HDL, LDLCALC, TRIG, CHOLHDL, LDLDIRECT in the last 72 hours. Thyroid Function Tests: No results for input(s): TSH, T4TOTAL, FREET4, T3FREE, THYROIDAB in the last 72 hours. Anemia Panel: No results for input(s): VITAMINB12, FOLATE, FERRITIN, TIBC, IRON, RETICCTPCT in the last 72 hours. Urine analysis:    Component Value Date/Time   COLORURINE YELLOW 01/14/2020 0821   APPEARANCEUR CLEAR 01/14/2020 0821   LABSPEC <1.005 (L) 01/14/2020 0821   PHURINE 5.0 01/14/2020 0821   GLUCOSEU NEGATIVE 01/14/2020 0821   GLUCOSEU NEGATIVE 01/10/2018 1217   HGBUR SMALL (A) 01/14/2020 0821   HGBUR large 05/07/2010 0845   BILIRUBINUR MODERATE (A) 01/14/2020 0821   BILIRUBINUR neg 01/26/2016 1003   KETONESUR NEGATIVE 01/14/2020 0821   PROTEINUR NEGATIVE 01/14/2020 0821   UROBILINOGEN 0.2 01/10/2018 1217   NITRITE POSITIVE (A) 01/14/2020 0821   LEUKOCYTESUR NEGATIVE 01/14/2020 0821   Sepsis Labs: @LABRCNTIP (procalcitonin:4,lacticidven:4)  ) Recent Results (from the past 240 hour(s))  Culture, Urine     Status: Abnormal   Collection Time: 01/14/20  4:55 PM   Specimen: Urine, Random  Result Value Ref Range Status   Specimen Description URINE, RANDOM  Final   Special Requests   Final    NONE Performed at [facility], [place] [address], [place], [place] [address]    Culture (A)  Final    >=100,000  COLONIES/mL ACINETOBACTER CALCOACETICUS/BAUMANNII COMPLEX >=100,000 COLONIES/mL ESCHERICHIA COLI    Report Status 01/16/2020 FINAL  Final   Organism ID, Bacteria ACINETOBACTER CALCOACETICUS/BAUMANNII COMPLEX (A)  Final   Organism ID, Bacteria ESCHERICHIA COLI (A)  Final      Susceptibility   Acinetobacter calcoaceticus/baumannii complex - MIC*    CEFTAZIDIME 4 SENSITIVE Sensitive     CIPROFLOXACIN <=0.25 SENSITIVE Sensitive     GENTAMICIN <=1 SENSITIVE Sensitive     IMIPENEM <=0.25 SENSITIVE Sensitive     PIP/TAZO <=4 SENSITIVE Sensitive     TRIMETH/SULFA <=20 SENSITIVE Sensitive     AMPICILLIN/SULBACTAM <=2 SENSITIVE Sensitive     * >=100,000 COLONIES/mL ACINETOBACTER CALCOACETICUS/BAUMANNII COMPLEX   Escherichia coli -  MIC*    AMPICILLIN >=32 RESISTANT Resistant     CEFAZOLIN <=4 SENSITIVE Sensitive     CEFTRIAXONE <=0.25 SENSITIVE Sensitive     CIPROFLOXACIN >=4 RESISTANT Resistant     GENTAMICIN <=1 SENSITIVE Sensitive     IMIPENEM <=0.25 SENSITIVE Sensitive     NITROFURANTOIN <=16 SENSITIVE Sensitive     TRIMETH/SULFA >=320 RESISTANT Resistant     AMPICILLIN/SULBACTAM >=32 RESISTANT Resistant     PIP/TAZO <=4 SENSITIVE Sensitive     * >=100,000 COLONIES/mL ESCHERICHIA COLI      Radiology Studies: No results found.   Scheduled Meds: . apixaban  5 mg Oral BID  . Chlorhexidine Gluconate Cloth  6 each Topical Daily  . Chlorhexidine Gluconate Cloth  6 each Topical Daily  . dronabinol  2.5 mg Oral BID AC  . feeding supplement (ENSURE ENLIVE)  237 mL Oral BID BM  . [START ON 01/24/2020] furosemide  40 mg Oral Daily  . insulin aspart  0-15 Units Subcutaneous TID WC  . insulin aspart  0-5 Units Subcutaneous QHS  . lactulose  20 g Oral BID  . latanoprost  1 drop Both Eyes QHS  . mirtazapine  15 mg Oral QHS  . multivitamin with minerals  1 tablet Oral Daily  . pantoprazole (PROTONIX) IV  40 mg Intravenous Q12H  . [START ON 01/24/2020] spironolactone  12.5 mg Oral Daily  .  timolol  1 drop Both Eyes Daily   Continuous Infusions:    LOS: 9 days   Time Spent in minutes   30 minutes Author:  Berle Mull, MD [facility] 01/23/2020  7:21 PM   To reach On-call, see care teams to locate the attending and reach out to them via [email or web address]. If 7PM-7AM, please contact night-coverage If you still have difficulty reaching the attending provider, please [name] the [facility] (Director on Call) for [facility] on amion for assistance.

## 2020-01-23 NOTE — Progress Notes (Signed)
Patient ID: Amanda [name], female   DOB: 13-Nov-1940, 79 y.o.   MRN: [id number]  This NP reviewed medical records, received report from team and then visited patient at the bedside as a follow up palliative medicine needs.  Patient was initially seen by the palliative medicine team on 01-15-20.  This is an 79 year old female with a past medical history of CAD, type 2 diabetes, myeloproliferative disorder, decompensated liver cirrhosis, chronic kidney disease, and portal vein thrombosis admitted on 01/13/2020 with nausea vomiting and anorexia.  Patient has had multiple readmissions in the last 6 months.  Patient and her husband recognize that the patient has had continued physical and functional decline over the past many weeks, long with poor p.o. intake.  Care at home is becoming more difficult  Patient's husband is at the bedside today.  The patient is alert and oriented and opened and engaged in today's conversation.  Both she and her husband understand the seriousness of her current medical situation however they remain hopeful for improvement.  Education offered on concept specific to human mortality and the limitations of medical interventions to prolong quality of life when the body begins to fail to thrive.  Patient is open to all offered and available medical interventions to prolong quality of life.  She is hopeful for SNF for ongoing rehabilitation before ultimately returning home.  Discussed with patient/husbasnd the importance of continued conversation with family and their  medical providers regarding overall plan of care and treatment options,  ensuring decisions are within the context of the patients values and GOCs.  Recommend outpatient palliative care services at facility on discharge.  Questions and concerns addressed   Discussed with Dr Posey Pronto   Total time spent on the unit was 25 minutes  Greater than 50% of the time was spent in counseling and coordination of care  Wadie Lessen NP  Palliative Medicine Team Team Phone # [phone] Pager [phone]

## 2020-01-23 NOTE — Discharge Instructions (Signed)
Information on my medicine - ELIQUIS (apixaban)  This medication education was reviewed with me or my healthcare representative as part of my discharge preparation. - You were taking this medication prior to this admission.   Why was Eliquis prescribed for you? Eliquis was prescribed to treat blood clots found on recent hospitalization (portal vein thrombosis), and to reduce the risk of blood clots  occurring again.   What do You need to know about Eliquis ? The dose is ONE 5 mg tablet taken TWICE daily.  Eliquis may be taken with or without food.   Try to take the dose about the same time in the morning and in the evening. If you have difficulty swallowing the tablet whole please discuss with your pharmacist how to take the medication safely.  Take Eliquis exactly as prescribed and DO NOT stop taking Eliquis without talking to the doctor who prescribed the medication.  Stopping may increase your risk of developing a new blood clot.  Refill your prescription before you run out.  After discharge, you should have regular check-up appointments with your healthcare provider that is prescribing your Eliquis.    What do you do if you miss a dose? If a dose of ELIQUIS is not taken at the scheduled time, take it as soon as possible on the same day and twice-daily administration should be resumed. The dose should not be doubled to make up for a missed dose.  Important Safety Information A possible side effect of Eliquis is bleeding. You should call your healthcare provider right away if you experience any of the following: ? Bleeding from an injury or your nose that does not stop. ? Unusual colored urine (red or dark brown) or unusual colored stools (red or black). ? Unusual bruising for unknown reasons. ? A serious fall or if you hit your head (even if there is no bleeding).  Some medicines may interact with Eliquis and might increase your risk of bleeding or clotting while on Eliquis.  To help avoid this, consult your healthcare provider or pharmacist prior to using any new prescription or non-prescription medications, including herbals, vitamins, non-steroidal anti-inflammatory drugs (NSAIDs) and supplements.  This website has more information on Eliquis (apixaban): http://[email or web address].com/eliquis/home
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
## 2020-01-23 NOTE — Telephone Encounter (Signed)
5/12 at 930 am with Dr [name].

## 2020-01-23 NOTE — Progress Notes (Signed)
Orthopedic Tech Progress Note Patient Details:  Amanda [name] 04/14/1941 [id number]  Ortho Devices Type of Ortho Device: Haematologist Ortho Device/Splint Location: B LE Ortho Device/Splint Interventions: Application   Post Interventions Patient Tolerated: Well Instructions Provided: Care of device   Eyob Godlewski E Yoland Scherr 01/23/2020, 7:51 PM

## 2020-01-23 NOTE — Telephone Encounter (Signed)
-----   Message from Alfredia Ferguson, PA-C sent at 01/23/2020 11:51 AM EDT ----- Regarding: office follow up Amanda [name] , this is a [name] patient with decompensated cirrhosis.  She has been in and out of the hospital over the past couple of months. This admit she underwent EGD with finding of portal gastropathy, and had duodenal AVMs which were treated with APC and Endo Clip for drift in hemoglobin and heme positive stool, no active bleed.  No esophageal varices.  She is quite debilitated at this point, she is going for an inpatient rehab stay.  Hospitalist requesting office follow-up.  Please get her an appointment for 5 to 6 weeks from now.  If you give me the appointment info I will put it in her hospital chart thanks

## 2020-01-24 ENCOUNTER — Ambulatory Visit: Payer: Medicare HMO | Admitting: Physical Therapy

## 2020-01-24 DIAGNOSIS — R531 Weakness: Secondary | ICD-10-CM

## 2020-01-24 LAB — CBC
HCT: 25.4 % — ABNORMAL LOW (ref 36.0–46.0)
HCT: 26.3 % — ABNORMAL LOW (ref 36.0–46.0)
Hemoglobin: 7.5 g/dL — ABNORMAL LOW (ref 12.0–15.0)
Hemoglobin: 7.7 g/dL — ABNORMAL LOW (ref 12.0–15.0)
MCH: 28.5 pg (ref 26.0–34.0)
MCH: 28.4 pg (ref 26.0–34.0)
MCHC: 29.5 g/dL — ABNORMAL LOW (ref 30.0–36.0)
MCHC: 29.3 g/dL — ABNORMAL LOW (ref 30.0–36.0)
MCV: 96.6 fL (ref 80.0–100.0)
MCV: 97 fL (ref 80.0–100.0)
Platelets: 229 10*3/uL (ref 150–400)
Platelets: 285 10*3/uL (ref 150–400)
RBC: 2.63 MIL/uL — ABNORMAL LOW (ref 3.87–5.11)
RBC: 2.71 MIL/uL — ABNORMAL LOW (ref 3.87–5.11)
RDW: 21.6 % — ABNORMAL HIGH (ref 11.5–15.5)
RDW: 21.8 % — ABNORMAL HIGH (ref 11.5–15.5)
WBC: 23.8 10*3/uL — ABNORMAL HIGH (ref 4.0–10.5)
WBC: 26.7 10*3/uL — ABNORMAL HIGH (ref 4.0–10.5)
nRBC: 1 % — ABNORMAL HIGH (ref 0.0–0.2)
nRBC: 1.1 % — ABNORMAL HIGH (ref 0.0–0.2)

## 2020-01-24 LAB — RETICULOCYTES
Immature Retic Fract: 39.9 % — ABNORMAL HIGH (ref 2.3–15.9)
RBC.: 2.78 MIL/uL — ABNORMAL LOW (ref 3.87–5.11)
Retic Count, Absolute: 196.8 10*3/uL — ABNORMAL HIGH (ref 19.0–186.0)
Retic Ct Pct: 7.1 % — ABNORMAL HIGH (ref 0.4–3.1)

## 2020-01-24 LAB — COMPREHENSIVE METABOLIC PANEL
ALT: 13 U/L (ref 0–44)
AST: 33 U/L (ref 15–41)
Albumin: 2.3 g/dL — ABNORMAL LOW (ref 3.5–5.0)
Alkaline Phosphatase: 88 U/L (ref 38–126)
Anion gap: 9 (ref 5–15)
BUN: 12 mg/dL (ref 8–23)
CO2: 26 mmol/L (ref 22–32)
Calcium: 8.3 mg/dL — ABNORMAL LOW (ref 8.9–10.3)
Chloride: 103 mmol/L (ref 98–111)
Creatinine, Ser: 1.12 mg/dL — ABNORMAL HIGH (ref 0.44–1.00)
GFR calc Af Amer: 54 mL/min — ABNORMAL LOW (ref >60)
GFR calc non Af Amer: 46 mL/min — ABNORMAL LOW (ref >60)
Glucose, Bld: 136 mg/dL — ABNORMAL HIGH (ref 70–99)
Potassium: 3.8 mmol/L (ref 3.5–5.1)
Sodium: 138 mmol/L (ref 135–145)
Total Bilirubin: 1.3 mg/dL — ABNORMAL HIGH (ref 0.3–1.2)
Total Protein: 4.6 g/dL — ABNORMAL LOW (ref 6.5–8.1)

## 2020-01-24 LAB — CBC WITH DIFFERENTIAL/PLATELET
Abs Immature Granulocytes: 0 10*3/uL (ref 0.00–0.07)
Basophils Absolute: 0.3 10*3/uL — ABNORMAL HIGH (ref 0.0–0.1)
Basophils Relative: 1 %
Eosinophils Absolute: 0.8 10*3/uL — ABNORMAL HIGH (ref 0.0–0.5)
Eosinophils Relative: 3 %
HCT: 27.7 % — ABNORMAL LOW (ref 36.0–46.0)
Hemoglobin: 8 g/dL — ABNORMAL LOW (ref 12.0–15.0)
Lymphocytes Relative: 3 %
Lymphs Abs: 0.8 10*3/uL (ref 0.7–4.0)
MCH: 28.1 pg (ref 26.0–34.0)
MCHC: 28.9 g/dL — ABNORMAL LOW (ref 30.0–36.0)
MCV: 97.2 fL (ref 80.0–100.0)
Monocytes Absolute: 0 10*3/uL — ABNORMAL LOW (ref 0.1–1.0)
Monocytes Relative: 0 %
Neutro Abs: 24.7 10*3/uL — ABNORMAL HIGH (ref 1.7–7.7)
Neutrophils Relative %: 93 %
Platelets: 272 10*3/uL (ref 150–400)
RBC: 2.85 MIL/uL — ABNORMAL LOW (ref 3.87–5.11)
RDW: 21.4 % — ABNORMAL HIGH (ref 11.5–15.5)
WBC: 26.6 10*3/uL — ABNORMAL HIGH (ref 4.0–10.5)
nRBC: 1 % — ABNORMAL HIGH (ref 0.0–0.2)

## 2020-01-24 LAB — TYPE AND SCREEN
ABO/RH(D): O POS
Antibody Screen: NEGATIVE

## 2020-01-24 LAB — GLUCOSE, CAPILLARY
Glucose-Capillary: 131 mg/dL — ABNORMAL HIGH (ref 70–99)
Glucose-Capillary: 131 mg/dL — ABNORMAL HIGH (ref 70–99)
Glucose-Capillary: 128 mg/dL — ABNORMAL HIGH (ref 70–99)
Glucose-Capillary: 118 mg/dL — ABNORMAL HIGH (ref 70–99)

## 2020-01-24 LAB — MAGNESIUM: Magnesium: 1.7 mg/dL (ref 1.7–2.4)

## 2020-01-24 MED ORDER — INSULIN ASPART 100 UNIT/ML ~~LOC~~ SOLN: 0.0000 [IU] | Freq: Three times a day (TID) | SUBCUTANEOUS | 0 refills | Status: DC

## 2020-01-24 MED ORDER — DRONABINOL 2.5 MG PO CAPS: 2.5000 mg | ORAL_CAPSULE | Freq: Two times a day (BID) | ORAL | 0 refills | Status: DC

## 2020-01-24 MED ORDER — PANTOPRAZOLE SODIUM 40 MG PO TBEC: 40.0000 mg | DELAYED_RELEASE_TABLET | Freq: Two times a day (BID) | ORAL | 0 refills | Status: DC

## 2020-01-24 MED ORDER — LACTULOSE 10 GM/15ML PO SOLN: 20.0000 g | Freq: Two times a day (BID) | ORAL | 0 refills | Status: DC

## 2020-01-24 MED ORDER — SPIRONOLACTONE 25 MG PO TABS: 12.5000 mg | ORAL_TABLET | Freq: Every day | ORAL | 0 refills | Status: DC

## 2020-01-24 MED ORDER — ENSURE ENLIVE PO LIQD: 237.0000 mL | Freq: Two times a day (BID) | ORAL | 12 refills | Status: DC

## 2020-01-24 NOTE — Progress Notes (Signed)
Physical Therapy Treatment Patient Details Name: Amanda [name] MRN: [id number] DOB: [birth year] Today's Date: 01/24/2020    History of Present Illness 79 y.o. female  with past medical history of CAD (s/p stents), DM2, myeloproliferative disorder, decompensated liver cirrhosis, CKD, and portal vein thrombosis admitted on 01/13/2020 with nausea, vomiting, and anorexia. Patient had admission about 2 weeks ago for colitis. Found to have [name]. She was admitted for failure to thrive. PMT consulted to assist with [place].    PT Comments    Pt admitted with above diagnosis.Pt with large BM in bed on arrival. Cleaned pt with total assist.  Pt was able to tolerate canalith repositioning maneuver for right BPPV but after treatment refused to get OOB. Pt currently with functional limitations due to balance and endurance deficits. Pt will benefit from skilled PT to increase their independence and safety with mobility to allow discharge to the venue listed below.    Follow Up Recommendations  SNF     Equipment Recommendations  Other (comment)(TBD next venue)    Recommendations for Other Services OT consult;Speech consult     Precautions / Restrictions Precautions Precautions: Fall Precaution Comments: CONTACT, Monitor for s/s of nausea, diarrhea/ vomiting. Restrictions Weight Bearing Restrictions: No    Mobility  Bed Mobility Overal bed mobility: Needs Assistance Bed Mobility: Rolling;Sit to Sidelying;Sit to Supine Rolling: Mod assist Sidelying to sit: Mod assist;+2 for physical assistance;HOB elevated;Max assist Supine to sit: Mod assist;HOB elevated;Max assist;+2 for physical assistance Sit to supine: Mod assist;Max assist;+2 for physical assistance   General bed mobility comments: Pt with BM on arrival. Cleaned pt with total assist and changed all linens.  Cahnged dressing on buttocks as well as it was soiled.  Tested for BPPV and pt positive for right BPPV.  Treated with canalith  repositioning maneuver.  Pt needed to rest in bed at end of treatment.  Refused to get into chair therefore laid back in bed.   Transfers                    Ambulation/Gait                 Stairs             Wheelchair Mobility    Modified Rankin (Stroke Patients Only)       Balance Overall balance assessment: Needs assistance Sitting-balance support: Bilateral upper extremity supported Sitting balance-Leahy Scale: Poor Sitting balance - Comments: Pt required multiple VCs and modA to max assist of 2 intermittently for sitting.  Pt dizzy after the canalith repositioning maneuver and wanted to stay in bed.  Postural control: Posterior lean                                  Cognition Arousal/Alertness: Awake/alert Behavior During Therapy: Flat affect Overall Cognitive Status: Within Functional Limits for tasks assessed                                        Exercises Total Joint Exercises Heel Slides: AROM;Both;5 reps Other Exercises Other Exercises: pt educated on importance for increased [place] for improved postural tolerance as well as after canalith repositioning maneuver    General Comments        Pertinent Vitals/Pain Pain Assessment: No/denies pain    Home Living  Prior Function            PT Goals (current goals can now be found in the care plan section) Acute Rehab PT Goals Patient Stated Goal: Participate as much as I can Progress towards PT goals: Progressing toward goals    Frequency    Min 3X/week      PT Plan Current plan remains appropriate    Co-evaluation              AM-PAC PT "6 Clicks" Mobility   Outcome Measure  Help needed turning from your back to your side while in a flat bed without using bedrails?: A Lot Help needed moving from lying on your back to sitting on the side of a flat bed without using bedrails?: A Lot Help needed moving to and  from a bed to a chair (including a wheelchair)?: Total Help needed standing up from a chair using your arms (e.g., wheelchair or bedside chair)?: Total Help needed to walk in hospital room?: Total Help needed climbing 3-5 steps with a railing? : Total 6 Click Score: 8    End of Session Equipment Utilized During Treatment: Gait belt;Oxygen Activity Tolerance: Patient limited by fatigue Patient left: in bed;with call bell/phone within reach;with family/visitor present Nurse Communication: Mobility status;Other (comment)(purewick needed to be placed) PT Visit Diagnosis: Muscle weakness (generalized) (M62.81);Difficulty in walking, not elsewhere classified (R26.2);Dizziness and giddiness (R42)     Time: 7737-3668 PT Time Calculation (min) (ACUTE ONLY): 23 min  Charges:  $Therapeutic Activity: 8-22 mins $Canalith Rep Proc: 8-22 mins                     Fradel Baldonado W,PT Acute Rehabilitation Services Pager:  [phone]  Office:  [place] 01/24/2020, 11:08 AM

## 2020-01-24 NOTE — Discharge Summary (Signed)
[facility] Discharge Summary   Patient: Amanda [name] UEA:[id number]  PCP: [name], NP  Date of admission: 01/13/2020   Date of discharge:  01/24/2020     Discharge Diagnoses:  Principal Problem:   Failure to thrive in adult Active Problems:   Cirrhosis ([place])   Type 2 diabetes mellitus with diabetic polyneuropathy ([place])   CKD (chronic kidney disease), stage III   Portal vein thrombosis   Nausea & vomiting   Myeloproliferative disorder ([place])   Goals of care, counseling/discussion   Palliative care by specialist   Malnutrition of moderate degree   AVM (arteriovenous malformation) of small bowel, acquired with hemorrhage  Admitted From: home Disposition:  SNF   Recommendations for Outpatient Follow-up:  1. PCP: Follow-up in 1 follow-up in 1 week 2. Continue to follow-up with palliative care. 3. Follow up LABS/TEST: Repeat CBC   Contact information for follow-up providers    [name], NP. Schedule an appointment as soon as possible for a visit in 1 week(s).   Specialty: Family Medicine Contact information: [address] [place] [address] [phone]        Yetta Flock, MD. Schedule an appointment as soon as possible for a visit in 1 month(s).   Specialty: Gastroenterology Contact information: [address] [place] [address] [phone]        Volanda Napoleon, MD. Schedule an appointment as soon as possible for a visit in 1 month(s).   Specialty: Oncology Contact information: [address] [place] [address] [phone]            Contact information for after-discharge care    Destination    [facility] .   Service: Skilled Nursing Contact information: [address] [place] [phone]                 Diet recommendation: Cardiac diet  Activity: The patient is advised to gradually reintroduce usual activities, as  tolerated  Discharge Condition: stable  Code Status: DNR   History of present illness: As per the H and P dictated on admission, "Amanda [name] is a 79 y.o. 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  Since recent hospitalization, the patient has remained in bed most of the time, not eating or drinking much, and not taking her medications. Symptoms wax and wane, she is able to tolerate small bites and sips sometimes. She denies fever or chills. SNF had been recommended by PT and physicians prior to the recent discharge but patient wanted to go home and her husband thought he would be able to care for her there. She now wants to go a nursing home. She had a visit with her PCP 2 days ago and it was advised that she return to the hospital in order to be placed in a nursing facility. "  Hospital Course:  Summary of her active problems in the hospital is as following.   Failure to thrive/ Moderate malnutrition  -Patient presented from home where she essentially is bedbound with ongoing nausea and loss of appetite -CT abdomen without acute abnormality.  Patient does have cirrhosis with portal hypertension, splenomegaly, small volume ascites, nonocclusive portal vein thrombosis.  Small left pleural effusion.  Hypodensity in the left urinary bladder, possible small bladder stone.  Abnormal endometrial thickening. -CT chest without acute pulmonary process.  Positive for pulmonary hypertension, findings of cirrhosis with sequela of portal hypertension -Palliative care consulted and appreciated, continue supportive care -Continue Marinol for decreased appetite -Nutrition consulted, continue supplements  E. Coli/  Acinetobacter UTI -Initially was placed on ceftriaxone -Urine culture showed>100k E. coli and acinetobacter calcoaceticus/baumanii complex -Both sensitive to Zosyn however patient with penicillin allergy therefore placed on Merrem (starting on 3/31) -Discussed with Dr. Megan Salon, ID, via phone, given that patient was symptomatic, changed to [facility] for 5 days total -Completed Merrem therapy  GI bleed/acute on chronic normocytic anemia -hemoglobin has trended downward over the past few days -FOBT + -EGD done 05/26/2018: Gastritis, no active bleeding.  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  Could be because of symptoms in the setting of anticoagulation.  Treated with APC (argon plasma coagulation).  2 clips were placed.  Recommendations were to hold Eliquis for now and to repeat hemoglobin and transfuse as needed. -Hemoglobin stable.  Resume Eliquis.  Decompensated cirrhosis  -Continue PPI continue lactulose -Diuretic held given recent loss of appetite with nausea and vomiting -Continue to monitor LFTs, bilirubin, platelets and albumin Resume Lasix.  Also added Aldactone.  Unna boots.  Diabetes mellitus, type II -Hemoglobin A1c was 5.1 earlier this month -Continue insulin sliding scale and CBG monitoring at SNF.   Chronic kidney disease, stage IIIa -Creatinine appears to be at baseline  Myeloproliferative disorder -Patient with chronic leukocytosis -Procalcitonin 0.22, not clearly suggestive of infection -Oncology, Dr. [name], consulted and appreciated, recommended holding jakafi even on discharge.  Portal vein thrombosis -Eliquis resumed on 01/23/2020.  Coronary artery disease -No complaints of chest pain at this  time -Eliquis resumed on 01/23/2020.  Essential hypertension -Lasix held  -Continue amlodipine  Glaucoma -continue eyedrops  Hypokalemia/hypomagnesemia -Resolved with replacement, continue to monitor and replace as needed  Hypoalbuminemia. Third spacing. Unna boots ordered.  Body mass index is 36.55 kg/m.  Nutrition Problem: Moderate Malnutrition Etiology: chronic illness(cirrhosis) Nutrition Interventions: Interventions: MVI, Ensure Enlive (each supplement provides 350kcal and 20 grams of protein), Magic cup  Patient was seen by physical therapy, who recommended SNF, which was arranged. On the day of the discharge the patient's vitals were stable, and no other acute medical condition were reported by patient. the patient was felt safe to be discharge at SNF with SNF.  Consultants: Palliative care Gastroenterology Oncology  Procedures: EGD  Discharge Exam: General: Appear in no distress, no Rash; Oral Mucosa Clear, moist. Cardiovascular: S1 and S2 Present, no Murmur, Respiratory: normal respiratory effort, Bilateral Air entry present and no Crackles, no wheezes Abdomen: Bowel Sound present, Soft and no tenderness, no hernia Extremities: bilateral  Pedal edema, no calf tenderness Neurology: alert and oriented to time, place, and person affect appropriate.  Filed Weights   01/15/20 0500 01/16/20 0500 01/18/20 0352  Weight: 97 kg 95.6 kg 98.1 kg   Vitals:   01/23/20 2031 01/24/20 0318  BP: (!) 112/53 (!) 111/47  Pulse: 85 89  Resp: 17 17  Temp: 97.7 F (36.5 C) 98.1 F (36.7 C)  SpO2: 97% 95%    DISCHARGE MEDICATION: Allergies as of 01/24/2020  Reactions   Doxycycline Hives, Swelling, Rash   Penicillins Swelling   Has patient had a PCN reaction causing immediate rash, facial/tongue/throat swelling, SOB or lightheadedness with hypotension: No Has patient had a PCN reaction causing severe rash involving mucus membranes or skin necrosis: No Has patient had a  PCN reaction that required hospitalization: No Has patient had a PCN reaction occurring within the last 10 years: No If all of the above answers are "NO", then may proceed with Cephalosporin use.   Lisinopril Cough   Tape Hives   Latex Hives, Itching, Rash      Medication List    STOP taking these medications   amLODipine 5 MG tablet Commonly known as: NORVASC   BD Pen Needle Nano U/F 32G X 4 MM Misc Generic drug: Insulin Pen Needle   diazepam 5 MG tablet Commonly known as: VALIUM   HumuLIN R U-500 KwikPen 500 UNIT/ML kwikpen Generic drug: insulin regular human CONCENTRATED   Jakafi 25 MG tablet Generic drug: ruxolitinib phosphate   oxyCODONE-acetaminophen 5-325 MG tablet Commonly known as: PERCOCET/ROXICET   potassium chloride 10 MEQ tablet Commonly known as: Klor-Con M10   promethazine 25 MG tablet Commonly known as: PHENERGAN     TAKE these medications   Accu-Chek Guide w/Device Kit 1 kit by Does not apply route as directed.   acetaminophen 325 MG tablet Commonly known as: TYLENOL Take 2 tablets (650 mg total) by mouth every 6 (six) hours as needed for mild pain (or Fever >/= 101).   apixaban 5 MG Tabs tablet Commonly known as: ELIQUIS Take 1 tablet (5 mg total) by mouth 2 (two) times daily. What changed: Another medication with the same name was removed. Continue taking this medication, and follow the directions you see here.   dronabinol 2.5 MG capsule Commonly known as: MARINOL Take 1 capsule (2.5 mg total) by mouth 2 (two) times daily before lunch and supper.   ezetimibe 10 MG tablet Commonly known as: ZETIA TAKE 1 TABLET(10 MG) BY MOUTH DAILY What changed: See the new instructions.   feeding supplement (ENSURE ENLIVE) Liqd Take 237 mLs by mouth 2 (two) times daily between meals.   furosemide 40 MG tablet Commonly known as: LASIX TAKE 1 TABLET(40 MG) BY MOUTH DAILY What changed: See the new instructions.   glucose blood test strip Commonly  known as: Accu-Chek Guide Use to check blood sugars 3 times daily   OneTouch Verio test strip Generic drug: glucose blood USE TO TEST BLOOD SUGAR THREE TIMES DAILY   insulin aspart 100 UNIT/ML injection Commonly known as: novoLOG Inject 0-15 Units into the skin 3 (three) times daily with meals. CBG 70 - 120: 0 units CBG 121 - 150: 2 units CBG 151 - 200: 3 units CBG 201 - 250: 5 units CBG 251 - 300: 8 units CBG 301 - 350: 11 units CBG 351 - 400: 15 units   lactulose 10 GM/15ML solution Commonly known as: CHRONULAC Take 30 mLs (20 g total) by mouth 2 (two) times daily. What changed: when to take this   latanoprost 0.005 % ophthalmic solution Commonly known as: XALATAN Place 1 drop into both eyes at bedtime.   lidocaine-prilocaine cream Commonly known as: EMLA Apply 1 application topically as needed. Please apply EMLA over the Port-A-Cath site 1 hour prior to coming to our office. What changed:   when to take this  reasons to take this  additional instructions   mirtazapine 15 MG tablet Commonly known as: REMERON Take 1  tablet (15 mg total) by mouth at bedtime.   pantoprazole 40 MG tablet Commonly known as: PROTONIX Take 1 tablet (40 mg total) by mouth 2 (two) times daily before a meal. What changed: when to take this   spironolactone 25 MG tablet Commonly known as: ALDACTONE Take 0.5 tablets (12.5 mg total) by mouth daily. Start taking on: January 25, 2020   timolol 0.5 % ophthalmic solution Commonly known as: TIMOPTIC Place 1 drop into both eyes daily.   triamcinolone cream 0.1 % Commonly known as: KENALOG Apply 1 application topically 2 (two) times daily. What changed:   when to take this  reasons to take this   Trulicity 1.5 CV/8.9FY Sopn Generic drug: Dulaglutide Inject 1.5 pens into the skin every Friday.      Allergies  Allergen Reactions  . Doxycycline Hives, Swelling and Rash  . Penicillins Swelling    Has patient had a PCN reaction causing  immediate rash, facial/tongue/throat swelling, SOB or lightheadedness with hypotension: No Has patient had a PCN reaction causing severe rash involving mucus membranes or skin necrosis: No Has patient had a PCN reaction that required hospitalization: No Has patient had a PCN reaction occurring within the last 10 years: No If all of the above answers are "NO", then may proceed with Cephalosporin use.  Marland Kitchen Lisinopril Cough  . Tape Hives  . Latex Hives, Itching and Rash   Discharge Instructions    Diet - low sodium heart healthy   Complete by: As directed    Increase activity slowly   Complete by: As directed       The results of significant diagnostics from this hospitalization (including imaging, microbiology, ancillary and laboratory) are listed below for reference.    Significant Diagnostic Studies: DG Chest 2 View  Result Date: 12/31/2019 CLINICAL DATA:  Post paracentesis. Pneumonia. Altered mental status EXAM: CHEST - 2 VIEW COMPARISON:  12/28/2019 FINDINGS: Cardiac enlargement without heart failure or edema. No significant pleural effusion. Atherosclerotic aortic arch Port-A-Cath tip in the SVC. Negative for pneumonia.  Mild bibasilar atelectasis. IMPRESSION: Mild bibasilar atelectasis.  Negative for pneumonia. Electronically Signed   By: Franchot Gallo M.D.   On: 12/31/2019 16:03   CT HEAD WO CONTRAST  Result Date: 12/29/2019 CLINICAL DATA:  79 year old female with altered mental status, generalized weakness and neck pain. EXAM: CT HEAD WITHOUT CONTRAST CT CERVICAL SPINE WITHOUT CONTRAST TECHNIQUE: Multidetector CT imaging of the head and cervical spine was performed following the standard protocol without intravenous contrast. Multiplanar CT image reconstructions of the cervical spine were also generated. COMPARISON:  03/22/2018 brain MR, 01/29/2014 head CT, 03/25/2018 neck CT and other studies. FINDINGS: CT HEAD FINDINGS Brain: No evidence of acute infarction, hemorrhage,  hydrocephalus, extra-axial collection or mass lesion/mass effect. Atrophy and chronic small-vessel white matter ischemic changes again noted. Vascular: Carotid and vertebral atherosclerotic calcifications are noted. Skull: Normal. Negative for fracture or focal lesion. Sinuses/Orbits: No acute abnormality. Chronic RIGHT mastoid effusion is again identified. Other: None CT CERVICAL SPINE FINDINGS Alignment: Normal. Skull base and vertebrae: No acute fracture. No primary bone lesion or focal pathologic process. Soft tissues and spinal canal: No prevertebral fluid or swelling. No visible canal hematoma. Disc levels: Unchanged mild to moderate degenerative disc disease/spondylosis and mild facet arthropathy within the cervical spine. These findings contribute to the following: Mild central spinal narrowing at C3-4 Mild central spinal narrowing at C4-5 Moderate central spinal and biforaminal narrowing at C5-6. Upper chest: Negative. Other: None IMPRESSION: 1. No evidence of acute  intracranial abnormality. Atrophy and chronic small-vessel white matter ischemic changes. 2. No static evidence of acute injury to the cervical spine. 3. Degenerative changes as described within significant change since 03/25/2018. Moderate central spinal and biforaminal narrowing at C5-6. 4. Chronic RIGHT mastoid effusion Electronically Signed   By: Margarette Canada M.D.   On: 12/29/2019 10:17   CT CHEST W CONTRAST  Result Date: 01/13/2020 CLINICAL DATA:  Concern for pleural effusion. EXAM: CT CHEST WITH CONTRAST TECHNIQUE: Multidetector CT imaging of the chest was performed during intravenous contrast administration. CONTRAST:  63m OMNIPAQUE IOHEXOL 300 MG/ML  SOLN COMPARISON:  Chest radiograph performed the same day and CT chest dated 03/23/2018. FINDINGS: Cardiovascular: A right internal jugular central venous port catheter tip terminates at the superior cavoatrial junction. The main pulmonary artery is enlarged, measuring 3.6 cm in  diameter. Vascular calcifications are seen in the coronary arteries and aortic arch. Normal heart size. No pericardial effusion. Mediastinum/Nodes: No enlarged mediastinal, hilar, or axillary lymph nodes. Thyroid gland, trachea, and esophagus demonstrate no significant findings. Lungs/Pleura: A 4 mm pulmonary nodule in the right lower lobe is not significantly changed for greater than 2 years and benign. There is mild bilateral dependent atelectasis. There is no focal consolidation, pleural effusion, or pneumothorax. Upper Abdomen: There is hepatic cirrhosis with small volume intraperitoneal fluid seen near the spleen. Nonocclusive thrombus in the main portal vein is not significantly changed. Musculoskeletal: No chest wall abnormality. No acute or significant osseous findings. IMPRESSION: 1. No acute pulmonary process. 2. Enlarged main pulmonary artery, suggestive of pulmonary hypertension. 3. Hepatic cirrhosis and sequela of portal hypertension as well as unchanged nonocclusive thrombus in the main portal vein. Aortic Atherosclerosis (ICD10-I70.0). Electronically Signed   By: TZerita BoersM.D.   On: 01/13/2020 18:47   CT CERVICAL SPINE WO CONTRAST  Result Date: 12/29/2019 CLINICAL DATA:  79year old female with altered mental status, generalized weakness and neck pain. EXAM: CT HEAD WITHOUT CONTRAST CT CERVICAL SPINE WITHOUT CONTRAST TECHNIQUE: Multidetector CT imaging of the head and cervical spine was performed following the standard protocol without intravenous contrast. Multiplanar CT image reconstructions of the cervical spine were also generated. COMPARISON:  03/22/2018 brain MR, 01/29/2014 head CT, 03/25/2018 neck CT and other studies. FINDINGS: CT HEAD FINDINGS Brain: No evidence of acute infarction, hemorrhage, hydrocephalus, extra-axial collection or mass lesion/mass effect. Atrophy and chronic small-vessel white matter ischemic changes again noted. Vascular: Carotid and vertebral atherosclerotic  calcifications are noted. Skull: Normal. Negative for fracture or focal lesion. Sinuses/Orbits: No acute abnormality. Chronic RIGHT mastoid effusion is again identified. Other: None CT CERVICAL SPINE FINDINGS Alignment: Normal. Skull base and vertebrae: No acute fracture. No primary bone lesion or focal pathologic process. Soft tissues and spinal canal: No prevertebral fluid or swelling. No visible canal hematoma. Disc levels: Unchanged mild to moderate degenerative disc disease/spondylosis and mild facet arthropathy within the cervical spine. These findings contribute to the following: Mild central spinal narrowing at C3-4 Mild central spinal narrowing at C4-5 Moderate central spinal and biforaminal narrowing at C5-6. Upper chest: Negative. Other: None IMPRESSION: 1. No evidence of acute intracranial abnormality. Atrophy and chronic small-vessel white matter ischemic changes. 2. No static evidence of acute injury to the cervical spine. 3. Degenerative changes as described within significant change since 03/25/2018. Moderate central spinal and biforaminal narrowing at C5-6. 4. Chronic RIGHT mastoid effusion Electronically Signed   By: JMargarette CanadaM.D.   On: 12/29/2019 10:17   CT ABDOMEN PELVIS W CONTRAST  Result Date: 01/13/2020 CLINICAL  DATA:  Unspecified abdominal pain. Weakness. EXAM: CT ABDOMEN AND PELVIS WITH CONTRAST TECHNIQUE: Multidetector CT imaging of the abdomen and pelvis was performed using the standard protocol following bolus administration of intravenous contrast. CONTRAST:  142m OMNIPAQUE IOHEXOL 300 MG/ML  SOLN COMPARISON:  Hepatic protocol abdominal CT 2 weeks ago 12/30/2019. routine abdominal CT 12/28/2019 FINDINGS: Lower chest: Small left pleural effusion. Mitral annulus calcifications. Central line at the atrial caval junction from a superior approach. There are coronary artery calcifications. Hepatobiliary: Hepatic cirrhosis with nodular contours and diffusely decreased hepatic density.  More geographic decreased density in the right lobe corresponds to that seen on prior CT. No new hepatic abnormality. Decreased gallbladder distension from prior exam. Small dependent gallstones again seen. No biliary dilatation. Pancreas: Parenchymal atrophy. No ductal dilatation or inflammation. Spleen: Splenomegaly with spleen spanning 15.9 cm AP. Rounded hypodensity in the superior spleen is unchanged. Additional small low-density lesion inferiorly is unchanged. Adrenals/Urinary Tract: Normal adrenal glands. No hydronephrosis or perinephric edema. Homogeneous renal enhancement. Symmetric renal excretion on delayed phase imaging. Urinary bladder is physiologically distended without wall thickening. Small dependent hyperdensity in the left urinary trigone may represent small bladder stone. Stomach/Bowel: Small hiatal hernia. Stomach is nondistended. No bowel obstruction or bowel inflammation. Appendix not confidently visualized. No evidence of appendicitis. Mild distal colonic diverticulosis without diverticulitis. Vascular/Lymphatic: Nonocclusive thrombus within the main portal vein extending to the portal splenic confluence. No significant change from prior exam. Intrahepatic portal veins are dilated. Splenic vein is dilated but patent. No mesenteric venous thrombus. Aortic atherosclerosis. No aortic aneurysm. No bulky abdominopelvic lymph nodes. Reproductive: Endometrial thickening again seen. No adnexal mass. Other: Small volume abdominopelvic ascites. No free intra-abdominal air. Small fat containing umbilical hernia. Rounded subcutaneous fluid density structure in the subcutaneous tissues lateral to the right hip may be a sebaceous cyst, unchanged. Musculoskeletal: There are no acute or suspicious osseous abnormalities. Possible avascular necrosis of the left femoral head. IMPRESSION: 1. No acute abnormality in the abdomen/pelvis. 2. Hepatic cirrhosis with portal hypertension, splenomegaly, and small volume  abdominopelvic ascites. Unchanged nonocclusive thrombus in the main portal vein. 3. Small left pleural effusion. 4. Small dependent hyperdensity in the left urinary bladder may represent a small bladder stone. 5. Abnormal endometrial thickening again seen for patient of this age. 6. Additional chronic findings as described. Aortic Atherosclerosis (ICD10-I70.0). Electronically Signed   By: MKeith RakeM.D.   On: 01/13/2020 02:42   CT ABDOMEN PELVIS W CONTRAST  Result Date: 12/28/2019 CLINICAL DATA:  Abdominal distension, nausea and vomiting the past 10 days. Increased weakness and confusion. EXAM: CT ABDOMEN AND PELVIS WITH CONTRAST TECHNIQUE: Multidetector CT imaging of the abdomen and pelvis was performed using the standard protocol following bolus administration of intravenous contrast. CONTRAST:  1065mOMNIPAQUE IOHEXOL 300 MG/ML  SOLN COMPARISON:  May 23, 2018. January 04, 2018. January 28, 2014. FINDINGS: Lower chest: Trace pleural fluid and mild bronchitis in the left lower lobe. Mild cardiomegaly. Four-vessel coronary calcification. Partial imaging of a right central vascular catheter port, its tip in the right cardiac atrium. Thoracic aortic calcified atherosclerosis. Hepatobiliary: A partially thrombosed main portal vein extending from its confluence with the mesenteric vein into the proximal portion of the right portal vein occupying approximately 50% of the vessel lumen. No thrombus in these regions on the August 2019 study. Patent right anterior and left portal veins. Cirrhosis with small amount of ascites. A 4 cm heterogeneous parenchyma in the right hepatic dome, incompletely characterized, and possibly secondary to the venous  thrombosis or an underlying malignancy. A 5.2 cm gallbladder dilatation with dense sludge or tiny stones layering at the gallbladder neck, which could partially obstruct the cystic duct. No intrahepatic biliary duct dilatation. An 8 mm dilatation of the proximal common  bile duct with normal distal tapering. Pancreas: Diffuse moderate atrophy and fatty infiltration of the pancreas. No acute pancreatitis. Spleen: Splenomegaly measuring 15 by 6 by 14 cm, similar. A 9 mm and 10 mm hypoattenuating lesion in the splenic parenchyma, similarly sized compared to March 2019. Although these are incompletely characterized, their stability suggests a benign hemorrhagic or cystic etiology and no further imaging characterization is indicated according to the ACR criteria. Adrenals/Urinary Tract: Punctate nonobstructing right renal calculus. Stomach/Bowel: Apparent mild diffuse colonic wall thickening that could be due in part to the adjacent ascites. Mild diverticulosis in the distal descending and sigmoid colon. No bowel obstruction. Surgically absent or decompressed appendix. Vascular/Lymphatic: Possible small focal dependent thrombus versus CT telemetry lead artifact in the inferior cava intrahepatic portion with less than 25% luminal narrowing. Patent and ectatic splenic vein. Abdominal aorta calcified atherosclerosis without aneurysmal dilatation. Circumferential calcified atherosclerosis resulting in 75% luminal narrowing of the celiac artery and 50% narrowing of the superior mesenteric artery. An abnormally enlarged 34 by 15 mm periportal lymph node. Clustered interaortocaval and para-aortic nodes measuring up to 12 by 26 mm. Reproductive: Age-appropriate atrophy of the uterine myometrium. A 10 mm endometrial stripe thickness in the sagittal plane, abnormally thickened for an 79 year old female. Please note the patient medical record states a history of hysterectomy. Probable bilateral oophorectomy. No adnexal cyst or mass. Other: A 25 mm simple cyst in the right lateral hip subdermal subcutaneous fat plane. No associated inflammatory change or wall thickening. Musculoskeletal: Diffuse bone demineralization and moderate to advanced skeletal degenerative changes most pronounced at the  lumbosacral facets. A 13 mm L4 vertebral body hemangioma present since April 2015. I discussed the critical value/emergent results and further recommended imaging by telephone at the time of interpretation on 12/28/2019 at 7:55 pm with provider Dr. Francia Greaves , who verbally acknowledged these results. 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 Small focal nonocclusive thrombus in the intrahepatic inferior vena cava. These may be secondary to the patient's cirrhosis, however, correlation with lab values including an alpha fetoprotein marker may be useful. Partial characterization of heterogeneous parenchyma in the right hepatic dome concerning for a possible hepatocellular carcinoma. Dedicated MR or CT liver protocol imaging without and with intravenous contrast was recommended. Probable recent viral infection with mild bronchitis and trace pleural effusion in the left lower lobe, a mild uncomplicated pancolitis, and gallbladder hydrops, a 5.2 cm gallbladder dilatation with dense sludge or tiny stones layering at the gallbladder neck, which could partially obstruct the cystic duct. If there is concern for acute cholecystitis or cystic duct obstruction, hepatobiliary scintigraphy could be considered. Abnormally thickened endometrial stripe for an 79 year old female. Recommend further gynecological consultation and nonemergent pelvis ultrasonography. A nonspecific abnormally enlarged periportal lymph node and abnormally enlarged retroperitoneal lymph node with clustered smaller nodes, potentially reactive or metastatic. Punctate nonobstructing right renal calculus. Four-vessel coronary calcification. A 25 mm subdermal cyst in the right lateral hip, possibly a sebaceous cyst.  No associated inflammatory change. Thoracoabdominal aortic calcified atherosclerosis.  (ICD10-I70.0). Electronically Signed   By:  Revonda Humphrey   On: 12/28/2019 20:21   US Paracentesis  Result Date: 12/31/2019 INDICATION: Patient with history NASH cirrhosis, colitis, chronic kidney disease, coronary artery disease, ascites. Request made for diagnostic and therapeutic paracentesis. EXAM: ULTRASOUND GUIDED DIAGNOSTIC AND THERAPEUTIC PARACENTESIS MEDICATIONS: None COMPLICATIONS: None immediate. PROCEDURE: Informed written consent was obtained from the patient after a discussion of the risks, benefits and alternatives to treatment. A timeout was performed prior to the initiation of the procedure. Initial ultrasound scanning demonstrates a small to moderate amount of ascites within the left mid to lower abdominal quadrant. The left mid to lower abdomen was prepped and draped in the usual sterile fashion. 1% lidocaine was used for local anesthesia. Following this, a 19 gauge, 10-cm, Yueh catheter was introduced. An ultrasound image was saved for documentation purposes. The paracentesis was performed. The catheter was removed and a dressing was applied. The patient tolerated the procedure well without immediate post procedural complication. FINDINGS: A total of approximately 1.5 liters of yellow fluid was removed. Samples were sent to the laboratory as requested by the clinical team. IMPRESSION: Successful ultrasound-guided diagnostic and therapeutic paracentesis yielding 1.5 liters of peritoneal fluid. Read by: Rowe [name], PA-C Electronically Signed   By: Jacqulynn Cadet M.D.   On: 12/31/2019 15:30   DG Chest Port 1 View  Result Date: 01/13/2020 CLINICAL DATA:  Shortness of breath and vomiting EXAM: PORTABLE CHEST 1 VIEW COMPARISON:  December 31, 2019 FINDINGS: The heart size and mediastinal contours are unchanged with mild cardiomegaly. Aortic knob calcifications. A right-sided MediPort catheter seen with the  tip at the superior cavoatrial junction. Probable subsegmental atelectasis seen at both lung bases. No large airspace consolidation or pleural effusion. IMPRESSION: Subsegmental atelectasis of both lung bases. Electronically Signed   By: Prudencio Pair M.D.   On: 01/13/2020 01:12   DG Chest Port 1 View  Result Date: 12/28/2019 CLINICAL DATA:  Weakness. EXAM: PORTABLE CHEST 1 VIEW COMPARISON:  August 10, 2019. FINDINGS: Stable cardiomegaly. No pneumothorax or pleural effusion is noted. Right lung is clear. Mild left lingular opacity is noted concerning for atelectasis or possibly infiltrate. Interval placement of right internal jugular Port-A-Cath with distal tip in expected position of cavoatrial junction. Bony thorax is unremarkable. IMPRESSION: Mild left lingular opacity is noted concerning for atelectasis or possibly infiltrate. Electronically Signed   By: Marijo Conception M.D.   On: 12/28/2019 16:11   CT LIVER ABDOMEN W WO CONTRAST  Result Date: 12/30/2019 CLINICAL DATA:  Inpatient. Cirrhosis. Possible right liver lesion on recent routine CT abdomen/pelvis study. EXAM: CT ABDOMEN WITHOUT AND WITH CONTRAST TECHNIQUE: Multidetector CT imaging of the abdomen was performed following the standard protocol before and following the bolus administration of intravenous contrast. CONTRAST:  116m OMNIPAQUE IOHEXOL 300 MG/ML  SOLN COMPARISON:  12/28/2019 CT abdomen/pelvis. FINDINGS: Lower chest: No significant pulmonary nodules or acute consolidative airspace disease. Superior approach central venous catheter tip seen at the cavoatrial junction. Coronary atherosclerosis. Hepatobiliary: Diffusely markedly irregular liver surface with slight relative hypertrophy of the lateral segment left liver lobe, compatible with advanced cirrhosis. There is heterogeneous density throughout the liver parenchyma on all sequences. On the precontrast sequence, there is an ill-defined geographic region of decreased attenuation  throughout the segment 7 right liver lobe (series 2/image 20), without arterial phase hyperenhancement or obvious portal venous washout (this region remains decreased in attenuation compared to the background parenchyma on all sequences), favoring heterogeneous hepatic steatosis. No discrete liver masses, specifically no foci of arterial phase  hyperenhancement or portal venous washout in the liver. Distended gallbladder (5.0 cm diameter). Layering small gallstones in the gallbladder. No definite gallbladder wall thickening. No biliary ductal dilatation. CBD diameter 6 mm. Pancreas: Normal, with no mass or duct dilation. Spleen: Mild splenomegaly. Craniocaudal splenic length 13.5 cm. No splenic mass. Adrenals/Urinary Tract: Normal adrenals. No renal stones. No hydronephrosis. No renal masses. Stomach/Bowel: Small hiatal hernia. Otherwise normal nondistended stomach. Visualized small and large bowel is normal caliber, with no bowel wall thickening. Vascular/Lymphatic: Atherosclerotic nonaneurysmal abdominal aorta. Patent hepatic, splenic and renal veins. 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 Stable small paraumbilical varices. No pathologically enlarged lymph nodes in the abdomen. Other: No pneumoperitoneum. Small volume abdominal ascites, similar. No focal fluid collections. Musculoskeletal: No aggressive appearing focal osseous lesions. Mild thoracolumbar spondylosis. IMPRESSION: 1. Advanced cirrhosis. Ill-defined geographic region of abnormal density in the segment 7 right liver lobe, with features favoring heterogeneous hepatic steatosis. No discrete liver masses, specifically no foci of arterial phase hyperenhancement or portal venous washout in the liver. Close imaging surveillance advised with MRI (strongly preferred) or CT abdomen without and with IV contrast in 3 months. 2. 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 3.  Stable small volume abdominal ascites. Mild splenomegaly. Small paraumbilical varices. 4. Cholelithiasis. Distended gallbladder, nonspecific. Correlate with ultrasound if there is clinical concern for acute cholecystitis. No biliary ductal dilatation. 5. Small hiatal hernia. 6. Aortic Atherosclerosis (ICD10-I70.0). Electronically Signed   By: Ilona Sorrel M.D.   On: 12/30/2019 11:09    Microbiology: Recent Results (from the past 240 hour(s))  Culture, Urine     Status: Abnormal   Collection Time: 01/14/20  4:55 PM   Specimen: Urine, Random  Result Value Ref Range Status   Specimen Description URINE, RANDOM  Final   Special Requests   Final    NONE Performed at [facility], [address], [place], [place] [address]    Culture (A)  Final    >=100,000 COLONIES/mL ACINETOBACTER CALCOACETICUS/BAUMANNII COMPLEX >=100,000 COLONIES/mL ESCHERICHIA COLI    Report Status 01/16/2020 FINAL  Final   Organism ID, Bacteria ACINETOBACTER CALCOACETICUS/BAUMANNII COMPLEX (A)  Final   Organism ID, Bacteria ESCHERICHIA COLI (A)  Final      Susceptibility   Acinetobacter calcoaceticus/baumannii complex - MIC*    CEFTAZIDIME 4 SENSITIVE Sensitive     CIPROFLOXACIN <=0.25 SENSITIVE Sensitive     GENTAMICIN <=1 SENSITIVE Sensitive     IMIPENEM <=0.25 SENSITIVE Sensitive     PIP/TAZO <=4 SENSITIVE Sensitive     TRIMETH/SULFA <=20 SENSITIVE Sensitive     AMPICILLIN/SULBACTAM <=2 SENSITIVE Sensitive     * >=100,000 COLONIES/mL ACINETOBACTER CALCOACETICUS/BAUMANNII COMPLEX   Escherichia coli - MIC*    AMPICILLIN >=32 RESISTANT Resistant     CEFAZOLIN <=4 SENSITIVE Sensitive     CEFTRIAXONE <=0.25 SENSITIVE Sensitive     CIPROFLOXACIN >=4 RESISTANT Resistant     GENTAMICIN <=1 SENSITIVE Sensitive     IMIPENEM <=0.25 SENSITIVE Sensitive     NITROFURANTOIN <=16 SENSITIVE Sensitive     TRIMETH/SULFA >=320 RESISTANT Resistant     AMPICILLIN/SULBACTAM >=32 RESISTANT Resistant     PIP/TAZO <=4 SENSITIVE  Sensitive     * >=100,000 COLONIES/mL ESCHERICHIA COLI  SARS CORONAVIRUS 2 (TAT 6-24 HRS) Nasopharyngeal Nasopharyngeal Swab     Status: None   Collection Time: 01/23/20  5:08 PM   Specimen: Nasopharyngeal Swab  Result Value Ref Range Status   SARS Coronavirus 2  NEGATIVE NEGATIVE Final    Comment: (NOTE) SARS-CoV-2 target nucleic acids are NOT DETECTED. The SARS-CoV-2 RNA is generally detectable in upper and lower respiratory specimens during the acute phase of infection. Negative results do not preclude SARS-CoV-2 infection, do not rule out co-infections with other pathogens, and should not be used as the sole basis for treatment or other patient management decisions. Negative results must be combined with clinical observations, patient history, and epidemiological information. The expected result is Negative. Fact Sheet for Patients: SugarRoll.be Fact Sheet for Healthcare Providers: https://www.woods-mathews.com/ This test is not yet approved or cleared by the [place] FDA and  has been authorized for detection and/or diagnosis of SARS-CoV-2 by FDA under an Emergency Use Authorization (EUA). This EUA will remain  in effect (meaning this test can be used) for the duration of the COVID-19 declaration under Section 56 4(b)(1) of the Act, 21 U.S.C. section 360bbb-3(b)(1), unless the authorization is terminated or revoked sooner. Performed at [facility], [place] [address], [place], [place] [address]      Labs: CBC: Recent Labs  Lab 01/19/20 [id number] 01/20/20 0758 01/21/20 0410 01/21/20 0410 01/22/20 0437 01/23/20 0453 01/23/20 2019 01/24/20 0418 01/24/20 1238  WBC 22.5*  --  27.2*  --   --   --  34.3* 23.8* 26.6*  NEUTROABS  --   --   --   --   --   --   --   --  PENDING  HGB 8.8*   < > 7.8*   < > 8.6* 8.0* 8.1* 7.5* 8.0*  HCT 27.8*   < > 26.0*   < > 28.7* 27.3* 27.6* 25.4* 27.7*  MCV 93.9  --  97.7  --   --   --  98.2 96.6  97.2  PLT 213  --  242  --   --   --  319 229 272   < > = values in this interval not displayed.   Basic Metabolic Panel: Recent Labs  Lab 01/19/20 0352 01/21/20 0410 01/22/20 0437 01/23/20 0453 01/24/20 0418  NA 133* 136 135 136 138  K 3.6 3.8 4.0 4.0 3.8  CL 99 100 102 103 103  CO2 25 26 24 25 26   GLUCOSE 168* 135* 140* 141* 136*  BUN 15 13 13 12 12   CREATININE 0.90 0.90 0.99 1.19* 1.12*  CALCIUM 8.3* 8.5* 8.7* 8.5* 8.3*  MG  --   --  2.0  --  1.7   Liver Function Tests: Recent Labs  Lab 01/23/20 0453 01/24/20 0418  AST 31 33  ALT 12 13  ALKPHOS 112 88  BILITOT 1.4* 1.3*  PROT 4.8* 4.6*  ALBUMIN 2.3* 2.3*   No results for input(s): LIPASE, AMYLASE in the last 168 hours. No results for input(s): AMMONIA in the last 168 hours. Cardiac Enzymes: No results for input(s): CKTOTAL, CKMB, CKMBINDEX, TROPONINI in the last 168 hours. BNP (last 3 results) Recent Labs    08/10/19 1906  BNP 62.7   CBG: Recent Labs  Lab 01/23/20 1201 01/23/20 1647 01/23/20 2015 01/24/20 0802 01/24/20 1238  GLUCAP 146* 147* 132* 131* 131*    Time spent: 35 minutes  Signed:  Berle Mull  [facility]  01/24/2020 3:09 PM

## 2020-01-24 NOTE — Social Work (Addendum)
5:01pm- CSW received an additional call from pt husband Amanda [name]. He is inquiring about what the peer to peer questions/process looks like; CSW shared that I am not privy to what the conversation usually consists of as it is a conversation from one physician to another. CSW did share my observations from PT notes that pt requiring lots of assistance. Encouraged husband to share with pt that she needs to be engaged and participatory to her max ability if therapy comes by again. Again shared that we must let the process continue through the peer to peer.   4:54pm- CSW spoke with pt husband Amanda [name] via telephone after speaking with Dr. Posey Pronto. Dr. Posey Pronto has called [employer] and is awaiting a provider from [facility] to call him back to complete peer to peer. Amanda [name] is aware, he expresses worry about pt being denied and that "I can't take her home." Explained to him that there is always a chance that deniamay happen, but we need to let the process continue. This Probation officer explained that often if insurance companies determine that pt is not making progress with therapy they can deny, but again to let the process work out. Offered to call pt daughter Amanda [name] to also update her and pt husband declined. Let Amanda [name] know I would touch base with him as soon as morning rounds are complete tomorrow with any updates. Amanda [name] states understanding.   4:35pm- Authorization has gone to peer to peer review. This information was given to Dr. Posey Pronto via secure message. [employer] has been sent all clinicals by [facility]. #[phone] ext [phone]. Bedside RN Langley Gauss also aware.   Westley Hummer, MSW, [place] Work

## 2020-01-24 NOTE — Progress Notes (Signed)
Pt had me to take off the leg dressings,  She says that they are to hot and tight.  Encouraged pt to let me put them back on and she was very addiment and said NO.

## 2020-01-24 NOTE — Plan of Care (Signed)

## 2020-01-24 NOTE — TOC Progression Note (Addendum)
Transition of Care [facility]) - Progression Note    Patient Details  Name: Amanda [name] MRN: [id number] Date of Birth: 1941-01-18  Transition of Care [facility]) CM/SW [place], [place] Phone Number: 01/24/2020, 12:09 PM  Clinical Narrative:   3:27pm- CSW spoke with Juliann Pulse at [facility], we are still waiting on insurance approval. CSW called into room and discussed with [name] the delay. He is aware if we get auth pt can discharge today but if not then will be tomorrow. CSW will send dc summary etc in preparation in case we get auth today. This was also communicated w/ bedside RN.    12:09pm- Negative COVID received, CSW has f/u with [facility] to update them about discharge pending lab results. New auth pending (previous auth expired on 4/7). MD and RN aware. Will update when new auth received.    Expected Discharge Plan: Skilled Nursing Facility Barriers to Discharge: Continued Medical Work up  Expected Discharge Plan and Services Expected Discharge Plan: [place] In-house Referral: Clinical Social Work Discharge Planning Services: CM Consult Post Acute Care Choice: [place] Living arrangements for the past 2 months: Single Family Home   Readmission Risk Interventions Readmission Risk Prevention Plan 01/17/2020  Transportation Screening Complete  PCP or Specialist Appt within 3-5 Days Not Complete  Not Complete comments plan for SNF  HRI or Home Care Consult Complete  Social Work Consult for [place] Planning/Counseling Complete  Palliative Care Screening Complete  Medication Review [employer] photographer) Referral to Pharmacy  Some recent data might be hidden

## 2020-01-24 NOTE — Progress Notes (Signed)
[facility] PROGRESS NOTE  Patient: Amanda [name] QVH:[id number]   PCP: [name], NP DOB: [birth year]   DOA: 01/13/2020   DOS: 01/24/2020    Assessment and plan: Patient's morning CBC was low. Repeat CBC was ordered at 8 AM. Orders changed to stat at 10:55 AM. CBC resulted at 3:30 PM. Repeat CBC shows hemoglobin of 8.0. Discharge orders and med rec were performed. Social worker informed me that insurance has requested peer to peer before making the decision to approve SNF. I called insurance company at 4:34 PM.  Provided my information I was notified that the peer to peer provider will reach out to me either today or tomorrow.  So far have not received a call as of 6:05 PM.  Author: Berle Mull, MD [facility] 01/24/2020 6:03 PM   If 7PM-7AM, please contact night-coverage at [email or web address]

## 2020-01-25 DIAGNOSIS — I81 Portal vein thrombosis: Secondary | ICD-10-CM | POA: Diagnosis not present

## 2020-01-25 DIAGNOSIS — K921 Melena: Secondary | ICD-10-CM | POA: Diagnosis not present

## 2020-01-25 DIAGNOSIS — R0789 Other chest pain: Secondary | ICD-10-CM | POA: Diagnosis not present

## 2020-01-25 DIAGNOSIS — E876 Hypokalemia: Secondary | ICD-10-CM | POA: Diagnosis not present

## 2020-01-25 DIAGNOSIS — R410 Disorientation, unspecified: Secondary | ICD-10-CM | POA: Diagnosis not present

## 2020-01-25 DIAGNOSIS — H409 Unspecified glaucoma: Secondary | ICD-10-CM | POA: Diagnosis not present

## 2020-01-25 DIAGNOSIS — R1084 Generalized abdominal pain: Secondary | ICD-10-CM | POA: Diagnosis not present

## 2020-01-25 DIAGNOSIS — Z20822 Contact with and (suspected) exposure to covid-19: Secondary | ICD-10-CM | POA: Diagnosis not present

## 2020-01-25 DIAGNOSIS — R627 Adult failure to thrive: Secondary | ICD-10-CM | POA: Diagnosis not present

## 2020-01-25 DIAGNOSIS — F322 Major depressive disorder, single episode, severe without psychotic features: Secondary | ICD-10-CM | POA: Diagnosis not present

## 2020-01-25 DIAGNOSIS — N390 Urinary tract infection, site not specified: Secondary | ICD-10-CM | POA: Diagnosis present

## 2020-01-25 DIAGNOSIS — R4182 Altered mental status, unspecified: Secondary | ICD-10-CM | POA: Diagnosis not present

## 2020-01-25 DIAGNOSIS — K3189 Other diseases of stomach and duodenum: Secondary | ICD-10-CM | POA: Diagnosis present

## 2020-01-25 DIAGNOSIS — Z515 Encounter for palliative care: Secondary | ICD-10-CM | POA: Diagnosis not present

## 2020-01-25 DIAGNOSIS — K7581 Nonalcoholic steatohepatitis (NASH): Secondary | ICD-10-CM | POA: Diagnosis present

## 2020-01-25 DIAGNOSIS — K7460 Unspecified cirrhosis of liver: Secondary | ICD-10-CM | POA: Diagnosis not present

## 2020-01-25 DIAGNOSIS — D45 Polycythemia vera: Secondary | ICD-10-CM | POA: Diagnosis present

## 2020-01-25 DIAGNOSIS — K580 Irritable bowel syndrome with diarrhea: Secondary | ICD-10-CM | POA: Diagnosis present

## 2020-01-25 DIAGNOSIS — K2950 Unspecified chronic gastritis without bleeding: Secondary | ICD-10-CM | POA: Diagnosis present

## 2020-01-25 DIAGNOSIS — Z7401 Bed confinement status: Secondary | ICD-10-CM | POA: Diagnosis not present

## 2020-01-25 DIAGNOSIS — R0600 Dyspnea, unspecified: Secondary | ICD-10-CM | POA: Diagnosis not present

## 2020-01-25 DIAGNOSIS — M2550 Pain in unspecified joint: Secondary | ICD-10-CM | POA: Diagnosis not present

## 2020-01-25 DIAGNOSIS — Z66 Do not resuscitate: Secondary | ICD-10-CM | POA: Diagnosis not present

## 2020-01-25 DIAGNOSIS — D62 Acute posthemorrhagic anemia: Secondary | ICD-10-CM | POA: Diagnosis not present

## 2020-01-25 DIAGNOSIS — E11319 Type 2 diabetes mellitus with unspecified diabetic retinopathy without macular edema: Secondary | ICD-10-CM | POA: Diagnosis present

## 2020-01-25 DIAGNOSIS — R0602 Shortness of breath: Secondary | ICD-10-CM | POA: Diagnosis not present

## 2020-01-25 DIAGNOSIS — K228 Other specified diseases of esophagus: Secondary | ICD-10-CM | POA: Diagnosis present

## 2020-01-25 DIAGNOSIS — N1831 Chronic kidney disease, stage 3a: Secondary | ICD-10-CM | POA: Diagnosis not present

## 2020-01-25 DIAGNOSIS — B965 Pseudomonas (aeruginosa) (mallei) (pseudomallei) as the cause of diseases classified elsewhere: Secondary | ICD-10-CM | POA: Diagnosis present

## 2020-01-25 DIAGNOSIS — K7469 Other cirrhosis of liver: Secondary | ICD-10-CM | POA: Diagnosis present

## 2020-01-25 DIAGNOSIS — I130 Hypertensive heart and chronic kidney disease with heart failure and stage 1 through stage 4 chronic kidney disease, or unspecified chronic kidney disease: Secondary | ICD-10-CM | POA: Diagnosis not present

## 2020-01-25 DIAGNOSIS — F331 Major depressive disorder, recurrent, moderate: Secondary | ICD-10-CM | POA: Diagnosis not present

## 2020-01-25 DIAGNOSIS — R079 Chest pain, unspecified: Secondary | ICD-10-CM | POA: Diagnosis not present

## 2020-01-25 DIAGNOSIS — R52 Pain, unspecified: Secondary | ICD-10-CM | POA: Diagnosis not present

## 2020-01-25 DIAGNOSIS — M6281 Muscle weakness (generalized): Secondary | ICD-10-CM | POA: Diagnosis not present

## 2020-01-25 DIAGNOSIS — D72829 Elevated white blood cell count, unspecified: Secondary | ICD-10-CM | POA: Diagnosis not present

## 2020-01-25 DIAGNOSIS — Z7901 Long term (current) use of anticoagulants: Secondary | ICD-10-CM | POA: Diagnosis not present

## 2020-01-25 DIAGNOSIS — G3184 Mild cognitive impairment, so stated: Secondary | ICD-10-CM | POA: Diagnosis not present

## 2020-01-25 DIAGNOSIS — E440 Moderate protein-calorie malnutrition: Secondary | ICD-10-CM | POA: Diagnosis not present

## 2020-01-25 DIAGNOSIS — I10 Essential (primary) hypertension: Secondary | ICD-10-CM | POA: Diagnosis not present

## 2020-01-25 DIAGNOSIS — I2510 Atherosclerotic heart disease of native coronary artery without angina pectoris: Secondary | ICD-10-CM | POA: Diagnosis not present

## 2020-01-25 DIAGNOSIS — I5032 Chronic diastolic (congestive) heart failure: Secondary | ICD-10-CM | POA: Diagnosis present

## 2020-01-25 DIAGNOSIS — E1142 Type 2 diabetes mellitus with diabetic polyneuropathy: Secondary | ICD-10-CM | POA: Diagnosis not present

## 2020-01-25 DIAGNOSIS — D689 Coagulation defect, unspecified: Secondary | ICD-10-CM | POA: Diagnosis not present

## 2020-01-25 DIAGNOSIS — C946 Myelodysplastic disease, not classified: Secondary | ICD-10-CM | POA: Diagnosis not present

## 2020-01-25 DIAGNOSIS — D471 Chronic myeloproliferative disease: Secondary | ICD-10-CM | POA: Diagnosis not present

## 2020-01-25 DIAGNOSIS — R451 Restlessness and agitation: Secondary | ICD-10-CM | POA: Diagnosis not present

## 2020-01-25 DIAGNOSIS — R197 Diarrhea, unspecified: Secondary | ICD-10-CM | POA: Diagnosis not present

## 2020-01-25 DIAGNOSIS — R112 Nausea with vomiting, unspecified: Secondary | ICD-10-CM | POA: Diagnosis not present

## 2020-01-25 DIAGNOSIS — E1122 Type 2 diabetes mellitus with diabetic chronic kidney disease: Secondary | ICD-10-CM | POA: Diagnosis present

## 2020-01-25 DIAGNOSIS — K31811 Angiodysplasia of stomach and duodenum with bleeding: Secondary | ICD-10-CM | POA: Diagnosis not present

## 2020-01-25 DIAGNOSIS — F411 Generalized anxiety disorder: Secondary | ICD-10-CM | POA: Diagnosis not present

## 2020-01-25 DIAGNOSIS — N179 Acute kidney failure, unspecified: Secondary | ICD-10-CM | POA: Diagnosis not present

## 2020-01-25 DIAGNOSIS — B9689 Other specified bacterial agents as the cause of diseases classified elsewhere: Secondary | ICD-10-CM | POA: Diagnosis present

## 2020-01-25 DIAGNOSIS — D649 Anemia, unspecified: Secondary | ICD-10-CM | POA: Diagnosis not present

## 2020-01-25 LAB — CBC
HCT: 26.5 % — ABNORMAL LOW (ref 36.0–46.0)
Hemoglobin: 7.7 g/dL — ABNORMAL LOW (ref 12.0–15.0)
MCH: 28.7 pg (ref 26.0–34.0)
MCHC: 29.1 g/dL — ABNORMAL LOW (ref 30.0–36.0)
MCV: 98.9 fL (ref 80.0–100.0)
Platelets: 254 10*3/uL (ref 150–400)
RBC: 2.68 MIL/uL — ABNORMAL LOW (ref 3.87–5.11)
RDW: 21.7 % — ABNORMAL HIGH (ref 11.5–15.5)
WBC: 20 10*3/uL — ABNORMAL HIGH (ref 4.0–10.5)
nRBC: 0.7 % — ABNORMAL HIGH (ref 0.0–0.2)

## 2020-01-25 LAB — GLUCOSE, CAPILLARY
Glucose-Capillary: 126 mg/dL — ABNORMAL HIGH (ref 70–99)
Glucose-Capillary: 145 mg/dL — ABNORMAL HIGH (ref 70–99)

## 2020-01-25 MED ORDER — HEPARIN SOD (PORK) LOCK FLUSH 100 UNIT/ML IV SOLN
500.0000 [IU] | INTRAVENOUS | Status: DC | PRN
  Filled 2020-01-25: qty 5

## 2020-01-25 MED ORDER — HEPARIN SOD (PORK) LOCK FLUSH 100 UNIT/ML IV SOLN
500.0000 [IU] | Freq: Once | INTRAVENOUS | Status: AC
  Administered 2020-01-25: 12:00:00 500 [IU] via INTRAVENOUS
  Filled 2020-01-25: qty 5

## 2020-01-25 NOTE — Progress Notes (Signed)
Called [facility] for report, unable to get the nurse to take the report, phone number given per instruction, will wait for the call.

## 2020-01-25 NOTE — Discharge Summary (Signed)
[facility] Discharge Summary   Patient: Amanda [name] UUE:[id number]  PCP: [name], NP  Date of admission: 01/13/2020   Date of discharge:  01/25/2020     Discharge Diagnoses:  Principal Problem:   Failure to thrive in adult Active Problems:   Cirrhosis ([place])   Type 2 diabetes mellitus with diabetic polyneuropathy ([place])   CKD (chronic kidney disease), stage III   Portal vein thrombosis   Nausea & vomiting   Myeloproliferative disorder ([place])   Goals of care, counseling/discussion   Palliative care by specialist   Malnutrition of moderate degree   AVM (arteriovenous malformation) of small bowel, acquired with hemorrhage   Weakness generalized  Admitted From: home Disposition:  SNF   Recommendations for Outpatient Follow-up:  1. PCP: Follow-up in 1 follow-up in 1 week 2. Continue to follow-up with palliative care. 3. Follow up LABS/TEST: Repeat CBC   Contact information for follow-up providers    [name], NP. Schedule an appointment as soon as possible for a visit in 1 week(s).   Specialty: Family Medicine Contact information: [place] [address] [phone]        Volanda Napoleon, MD. Schedule an appointment as soon as possible for a visit in 1 month(s).   Specialty: Oncology Contact information: [address] [place] [address] [phone]        Milus Banister, MD. Daphane Shepherd on 02/27/2020.   Specialty: Gastroenterology Why: You have a follow-up appointment with Dr. [name] on 02/27/2020 at 9:30 AM. Contact information: [address] [place] [address] [phone]            Contact information for after-discharge care    Destination    [facility] Preferred SNF .   Service: Skilled Nursing Contact information: [address] [place] [phone]                 Diet recommendation: Cardiac diet  Activity: The patient is advised to  gradually reintroduce usual activities, as tolerated  Discharge Condition: stable  Code Status: DNR   History of present illness: As per the H and P dictated on admission, "Amanda [name] is a 79 y.o. 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  Since recent hospitalization, the patient has remained in bed most of the time, not eating or drinking much, and not taking her medications. Symptoms wax and wane, she is able to tolerate small bites and sips sometimes. She denies fever or chills. SNF had been recommended by PT and physicians prior to the recent discharge but patient wanted to go home and her husband thought he would be able to care for her there. She now wants to go a nursing home. She had a visit with her PCP 2 days ago and it was advised that she return to the hospital in order to be placed in a nursing facility. "  Hospital Course:  Summary of her active problems in the hospital is as following.   Failure to thrive/ Moderate malnutrition  -Patient presented from home where she essentially is bedbound with ongoing nausea and loss of appetite -CT abdomen without acute abnormality.  Patient does have cirrhosis with portal hypertension, splenomegaly, small volume  ascites, nonocclusive portal vein thrombosis.  Small left pleural effusion.  Hypodensity in the left urinary bladder, possible small bladder stone.  Abnormal endometrial thickening. -CT chest without acute pulmonary process.  Positive for pulmonary hypertension, findings of cirrhosis with sequela of portal hypertension -Palliative care consulted and appreciated, continue supportive care -Continue Marinol for decreased appetite -Nutrition  consulted, continue supplements  E. Coli/ Acinetobacter UTI -Initially was placed on ceftriaxone -Urine culture showed>100k E. coli and acinetobacter calcoaceticus/baumanii complex -Both sensitive to Zosyn however patient with penicillin allergy therefore placed on Merrem (starting on 3/31) -Discussed with Dr. Megan Salon, ID, via phone, given that patient was symptomatic, changed to [facility] for 5 days total -Completed Merrem therapy  GI bleed/acute on chronic normocytic anemia -hemoglobin has trended downward over the past few days -FOBT + -EGD done 05/26/2018: Gastritis, no active bleeding.  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  Could be because of symptoms in the setting of anticoagulation.  Treated with APC (argon plasma coagulation).  2 clips were placed.  Recommendations were to hold Eliquis for now and to repeat hemoglobin and transfuse as needed. -Hemoglobin stable.  Resume Eliquis.  Decompensated cirrhosis  -Continue PPI continue lactulose -Diuretic held given recent loss of appetite with nausea and vomiting -Continue to monitor LFTs, bilirubin, platelets and albumin Resume Lasix.  Also added Aldactone.  Unna boots.  Diabetes mellitus, type II -Hemoglobin A1c was 5.1 earlier this month -Continue insulin sliding scale and CBG monitoring at SNF.   Chronic kidney disease, stage IIIa -Creatinine appears to be at baseline  Myeloproliferative disorder -Patient with chronic leukocytosis -Procalcitonin 0.22, not clearly suggestive of infection -Oncology, Dr. [name], consulted and appreciated, recommended holding jakafi even on discharge.  Portal vein thrombosis -Eliquis resumed on 01/23/2020.  Coronary artery  disease -No complaints of chest pain at this time -Eliquis resumed on 01/23/2020.  Essential hypertension -Lasix held  -Continue amlodipine  Glaucoma -continue eyedrops  Hypokalemia/hypomagnesemia -Resolved with replacement, continue to monitor and replace as needed  Hypoalbuminemia. Third spacing. Unna boots ordered.  Body mass index is 36.55 kg/m.  Nutrition Problem: Moderate Malnutrition Etiology: chronic illness(cirrhosis) Nutrition Interventions: Interventions: MVI, Ensure Enlive (each supplement provides 350kcal and 20 grams of protein), Magic cup  Patient was seen by physical therapy, who recommended SNF, which was arranged. On the day of the discharge the patient's vitals were stable, and no other acute medical condition were reported by patient. the patient was felt safe to be discharge at SNF with SNF.  Consultants: Palliative care Gastroenterology Oncology  Procedures: EGD  Discharge Exam: General: Appear in no distress, no Rash; Oral Mucosa Clear, moist. Cardiovascular: S1 and S2 Present, no Murmur, Respiratory: normal respiratory effort, Bilateral Air entry present and no Crackles, no wheezes Abdomen: Bowel Sound present, Soft and no tenderness, no hernia Extremities: bilateral  Pedal edema, no calf tenderness Neurology: alert and oriented to time, place, and person affect appropriate.  Filed Weights   01/15/20 0500 01/16/20 0500 01/18/20 0352  Weight: 97 kg 95.6 kg 98.1 kg   Vitals:   01/24/20 2211 01/25/20 0457  BP: (!) 125/52 (!) 126/52  Pulse: 86 86  Resp: 17 18  Temp: 98.4 F (36.9 C) (!) 97.3 F (36.3 C)  SpO2: 94% 97%    DISCHARGE MEDICATION: Allergies  as of 01/25/2020      Reactions   Doxycycline Hives, Swelling, Rash   Penicillins Swelling   Has patient had a PCN reaction causing immediate rash, facial/tongue/throat swelling, SOB or lightheadedness with hypotension: No Has patient had a PCN reaction causing severe rash involving mucus  membranes or skin necrosis: No Has patient had a PCN reaction that required hospitalization: No Has patient had a PCN reaction occurring within the last 10 years: No If all of the above answers are "NO", then may proceed with Cephalosporin use.   Lisinopril Cough   Tape Hives   Latex Hives, Itching, Rash      Medication List    STOP taking these medications   amLODipine 5 MG tablet Commonly known as: NORVASC   BD Pen Needle Nano U/F 32G X 4 MM Misc Generic drug: Insulin Pen Needle   diazepam 5 MG tablet Commonly known as: VALIUM   HumuLIN R U-500 KwikPen 500 UNIT/ML kwikpen Generic drug: insulin regular human CONCENTRATED   Jakafi 25 MG tablet Generic drug: ruxolitinib phosphate   oxyCODONE-acetaminophen 5-325 MG tablet Commonly known as: PERCOCET/ROXICET   potassium chloride 10 MEQ tablet Commonly known as: Klor-Con M10   promethazine 25 MG tablet Commonly known as: PHENERGAN     TAKE these medications   Accu-Chek Guide w/Device Kit 1 kit by Does not apply route as directed.   acetaminophen 325 MG tablet Commonly known as: TYLENOL Take 2 tablets (650 mg total) by mouth every 6 (six) hours as needed for mild pain (or Fever >/= 101).   apixaban 5 MG Tabs tablet Commonly known as: ELIQUIS Take 1 tablet (5 mg total) by mouth 2 (two) times daily. What changed: Another medication with the same name was removed. Continue taking this medication, and follow the directions you see here.   dronabinol 2.5 MG capsule Commonly known as: MARINOL Take 1 capsule (2.5 mg total) by mouth 2 (two) times daily before lunch and supper.   ezetimibe 10 MG tablet Commonly known as: ZETIA TAKE 1 TABLET(10 MG) BY MOUTH DAILY What changed: See the new instructions.   feeding supplement (ENSURE ENLIVE) Liqd Take 237 mLs by mouth 2 (two) times daily between meals.   furosemide 40 MG tablet Commonly known as: LASIX TAKE 1 TABLET(40 MG) BY MOUTH DAILY What changed: See the new  instructions.   glucose blood test strip Commonly known as: Accu-Chek Guide Use to check blood sugars 3 times daily   OneTouch Verio test strip Generic drug: glucose blood USE TO TEST BLOOD SUGAR THREE TIMES DAILY   insulin aspart 100 UNIT/ML injection Commonly known as: novoLOG Inject 0-15 Units into the skin 3 (three) times daily with meals. CBG 70 - 120: 0 units CBG 121 - 150: 2 units CBG 151 - 200: 3 units CBG 201 - 250: 5 units CBG 251 - 300: 8 units CBG 301 - 350: 11 units CBG 351 - 400: 15 units   lactulose 10 GM/15ML solution Commonly known as: CHRONULAC Take 30 mLs (20 g total) by mouth 2 (two) times daily. What changed: when to take this   latanoprost 0.005 % ophthalmic solution Commonly known as: XALATAN Place 1 drop into both eyes at bedtime.   lidocaine-prilocaine cream Commonly known as: EMLA Apply 1 application topically as needed. Please apply EMLA over the Port-A-Cath site 1 hour prior to coming to our office. What changed:   when to take this  reasons to take this  additional instructions   mirtazapine 15  MG tablet Commonly known as: REMERON Take 1 tablet (15 mg total) by mouth at bedtime.   pantoprazole 40 MG tablet Commonly known as: PROTONIX Take 1 tablet (40 mg total) by mouth 2 (two) times daily before a meal. What changed: when to take this   spironolactone 25 MG tablet Commonly known as: ALDACTONE Take 0.5 tablets (12.5 mg total) by mouth daily.   timolol 0.5 % ophthalmic solution Commonly known as: TIMOPTIC Place 1 drop into both eyes daily.   triamcinolone cream 0.1 % Commonly known as: KENALOG Apply 1 application topically 2 (two) times daily. What changed:   when to take this  reasons to take this   Trulicity 1.5 ZO/1.0RU Sopn Generic drug: Dulaglutide Inject 1.5 pens into the skin every Friday.      Allergies  Allergen Reactions  . Doxycycline Hives, Swelling and Rash  . Penicillins Swelling    Has patient had a  PCN reaction causing immediate rash, facial/tongue/throat swelling, SOB or lightheadedness with hypotension: No Has patient had a PCN reaction causing severe rash involving mucus membranes or skin necrosis: No Has patient had a PCN reaction that required hospitalization: No Has patient had a PCN reaction occurring within the last 10 years: No If all of the above answers are "NO", then may proceed with Cephalosporin use.  Marland Kitchen Lisinopril Cough  . Tape Hives  . Latex Hives, Itching and Rash   Discharge Instructions    Diet - low sodium heart healthy   Complete by: As directed    Increase activity slowly   Complete by: As directed       The results of significant diagnostics from this hospitalization (including imaging, microbiology, ancillary and laboratory) are listed below for reference.    Significant Diagnostic Studies: DG Chest 2 View  Result Date: 12/31/2019 CLINICAL DATA:  Post paracentesis. Pneumonia. Altered mental status EXAM: CHEST - 2 VIEW COMPARISON:  12/28/2019 FINDINGS: Cardiac enlargement without heart failure or edema. No significant pleural effusion. Atherosclerotic aortic arch Port-A-Cath tip in the SVC. Negative for pneumonia.  Mild bibasilar atelectasis. IMPRESSION: Mild bibasilar atelectasis.  Negative for pneumonia. Electronically Signed   By: Franchot Gallo M.D.   On: 12/31/2019 16:03   CT HEAD WO CONTRAST  Result Date: 12/29/2019 CLINICAL DATA:  79 year old female with altered mental status, generalized weakness and neck pain. EXAM: CT HEAD WITHOUT CONTRAST CT CERVICAL SPINE WITHOUT CONTRAST TECHNIQUE: Multidetector CT imaging of the head and cervical spine was performed following the standard protocol without intravenous contrast. Multiplanar CT image reconstructions of the cervical spine were also generated. COMPARISON:  03/22/2018 brain MR, 01/29/2014 head CT, 03/25/2018 neck CT and other studies. FINDINGS: CT HEAD FINDINGS Brain: No evidence of acute infarction,  hemorrhage, hydrocephalus, extra-axial collection or mass lesion/mass effect. Atrophy and chronic small-vessel white matter ischemic changes again noted. Vascular: Carotid and vertebral atherosclerotic calcifications are noted. Skull: Normal. Negative for fracture or focal lesion. Sinuses/Orbits: No acute abnormality. Chronic RIGHT mastoid effusion is again identified. Other: None CT CERVICAL SPINE FINDINGS Alignment: Normal. Skull base and vertebrae: No acute fracture. No primary bone lesion or focal pathologic process. Soft tissues and spinal canal: No prevertebral fluid or swelling. No visible canal hematoma. Disc levels: Unchanged mild to moderate degenerative disc disease/spondylosis and mild facet arthropathy within the cervical spine. These findings contribute to the following: Mild central spinal narrowing at C3-4 Mild central spinal narrowing at C4-5 Moderate central spinal and biforaminal narrowing at C5-6. Upper chest: Negative. Other: None IMPRESSION: 1. No evidence  of acute intracranial abnormality. Atrophy and chronic small-vessel white matter ischemic changes. 2. No static evidence of acute injury to the cervical spine. 3. Degenerative changes as described within significant change since 03/25/2018. Moderate central spinal and biforaminal narrowing at C5-6. 4. Chronic RIGHT mastoid effusion Electronically Signed   By: Margarette Canada M.D.   On: 12/29/2019 10:17   CT CHEST W CONTRAST  Result Date: 01/13/2020 CLINICAL DATA:  Concern for pleural effusion. EXAM: CT CHEST WITH CONTRAST TECHNIQUE: Multidetector CT imaging of the chest was performed during intravenous contrast administration. CONTRAST:  21m OMNIPAQUE IOHEXOL 300 MG/ML  SOLN COMPARISON:  Chest radiograph performed the same day and CT chest dated 03/23/2018. FINDINGS: Cardiovascular: A right internal jugular central venous port catheter tip terminates at the superior cavoatrial junction. The main pulmonary artery is enlarged, measuring 3.6 cm  in diameter. Vascular calcifications are seen in the coronary arteries and aortic arch. Normal heart size. No pericardial effusion. Mediastinum/Nodes: No enlarged mediastinal, hilar, or axillary lymph nodes. Thyroid gland, trachea, and esophagus demonstrate no significant findings. Lungs/Pleura: A 4 mm pulmonary nodule in the right lower lobe is not significantly changed for greater than 2 years and benign. There is mild bilateral dependent atelectasis. There is no focal consolidation, pleural effusion, or pneumothorax. Upper Abdomen: There is hepatic cirrhosis with small volume intraperitoneal fluid seen near the spleen. Nonocclusive thrombus in the main portal vein is not significantly changed. Musculoskeletal: No chest wall abnormality. No acute or significant osseous findings. IMPRESSION: 1. No acute pulmonary process. 2. Enlarged main pulmonary artery, suggestive of pulmonary hypertension. 3. Hepatic cirrhosis and sequela of portal hypertension as well as unchanged nonocclusive thrombus in the main portal vein. Aortic Atherosclerosis (ICD10-I70.0). Electronically Signed   By: TZerita BoersM.D.   On: 01/13/2020 18:47   CT CERVICAL SPINE WO CONTRAST  Result Date: 12/29/2019 CLINICAL DATA:  79year old female with altered mental status, generalized weakness and neck pain. EXAM: CT HEAD WITHOUT CONTRAST CT CERVICAL SPINE WITHOUT CONTRAST TECHNIQUE: Multidetector CT imaging of the head and cervical spine was performed following the standard protocol without intravenous contrast. Multiplanar CT image reconstructions of the cervical spine were also generated. COMPARISON:  03/22/2018 brain MR, 01/29/2014 head CT, 03/25/2018 neck CT and other studies. FINDINGS: CT HEAD FINDINGS Brain: No evidence of acute infarction, hemorrhage, hydrocephalus, extra-axial collection or mass lesion/mass effect. Atrophy and chronic small-vessel white matter ischemic changes again noted. Vascular: Carotid and vertebral atherosclerotic  calcifications are noted. Skull: Normal. Negative for fracture or focal lesion. Sinuses/Orbits: No acute abnormality. Chronic RIGHT mastoid effusion is again identified. Other: None CT CERVICAL SPINE FINDINGS Alignment: Normal. Skull base and vertebrae: No acute fracture. No primary bone lesion or focal pathologic process. Soft tissues and spinal canal: No prevertebral fluid or swelling. No visible canal hematoma. Disc levels: Unchanged mild to moderate degenerative disc disease/spondylosis and mild facet arthropathy within the cervical spine. These findings contribute to the following: Mild central spinal narrowing at C3-4 Mild central spinal narrowing at C4-5 Moderate central spinal and biforaminal narrowing at C5-6. Upper chest: Negative. Other: None IMPRESSION: 1. No evidence of acute intracranial abnormality. Atrophy and chronic small-vessel white matter ischemic changes. 2. No static evidence of acute injury to the cervical spine. 3. Degenerative changes as described within significant change since 03/25/2018. Moderate central spinal and biforaminal narrowing at C5-6. 4. Chronic RIGHT mastoid effusion Electronically Signed   By: JMargarette CanadaM.D.   On: 12/29/2019 10:17   CT ABDOMEN PELVIS W CONTRAST  Result Date:  01/13/2020 CLINICAL DATA:  Unspecified abdominal pain. Weakness. EXAM: CT ABDOMEN AND PELVIS WITH CONTRAST TECHNIQUE: Multidetector CT imaging of the abdomen and pelvis was performed using the standard protocol following bolus administration of intravenous contrast. CONTRAST:  129m OMNIPAQUE IOHEXOL 300 MG/ML  SOLN COMPARISON:  Hepatic protocol abdominal CT 2 weeks ago 12/30/2019. routine abdominal CT 12/28/2019 FINDINGS: Lower chest: Small left pleural effusion. Mitral annulus calcifications. Central line at the atrial caval junction from a superior approach. There are coronary artery calcifications. Hepatobiliary: Hepatic cirrhosis with nodular contours and diffusely decreased hepatic density.  More geographic decreased density in the right lobe corresponds to that seen on prior CT. No new hepatic abnormality. Decreased gallbladder distension from prior exam. Small dependent gallstones again seen. No biliary dilatation. Pancreas: Parenchymal atrophy. No ductal dilatation or inflammation. Spleen: Splenomegaly with spleen spanning 15.9 cm AP. Rounded hypodensity in the superior spleen is unchanged. Additional small low-density lesion inferiorly is unchanged. Adrenals/Urinary Tract: Normal adrenal glands. No hydronephrosis or perinephric edema. Homogeneous renal enhancement. Symmetric renal excretion on delayed phase imaging. Urinary bladder is physiologically distended without wall thickening. Small dependent hyperdensity in the left urinary trigone may represent small bladder stone. Stomach/Bowel: Small hiatal hernia. Stomach is nondistended. No bowel obstruction or bowel inflammation. Appendix not confidently visualized. No evidence of appendicitis. Mild distal colonic diverticulosis without diverticulitis. Vascular/Lymphatic: Nonocclusive thrombus within the main portal vein extending to the portal splenic confluence. No significant change from prior exam. Intrahepatic portal veins are dilated. Splenic vein is dilated but patent. No mesenteric venous thrombus. Aortic atherosclerosis. No aortic aneurysm. No bulky abdominopelvic lymph nodes. Reproductive: Endometrial thickening again seen. No adnexal mass. Other: Small volume abdominopelvic ascites. No free intra-abdominal air. Small fat containing umbilical hernia. Rounded subcutaneous fluid density structure in the subcutaneous tissues lateral to the right hip may be a sebaceous cyst, unchanged. Musculoskeletal: There are no acute or suspicious osseous abnormalities. Possible avascular necrosis of the left femoral head. IMPRESSION: 1. No acute abnormality in the abdomen/pelvis. 2. Hepatic cirrhosis with portal hypertension, splenomegaly, and small volume  abdominopelvic ascites. Unchanged nonocclusive thrombus in the main portal vein. 3. Small left pleural effusion. 4. Small dependent hyperdensity in the left urinary bladder may represent a small bladder stone. 5. Abnormal endometrial thickening again seen for patient of this age. 6. Additional chronic findings as described. Aortic Atherosclerosis (ICD10-I70.0). Electronically Signed   By: MKeith RakeM.D.   On: 01/13/2020 02:42   CT ABDOMEN PELVIS W CONTRAST  Result Date: 12/28/2019 CLINICAL DATA:  Abdominal distension, nausea and vomiting the past 10 days. Increased weakness and confusion. EXAM: CT ABDOMEN AND PELVIS WITH CONTRAST TECHNIQUE: Multidetector CT imaging of the abdomen and pelvis was performed using the standard protocol following bolus administration of intravenous contrast. CONTRAST:  1063mOMNIPAQUE IOHEXOL 300 MG/ML  SOLN COMPARISON:  May 23, 2018. January 04, 2018. January 28, 2014. FINDINGS: Lower chest: Trace pleural fluid and mild bronchitis in the left lower lobe. Mild cardiomegaly. Four-vessel coronary calcification. Partial imaging of a right central vascular catheter port, its tip in the right cardiac atrium. Thoracic aortic calcified atherosclerosis. Hepatobiliary: A partially thrombosed main portal vein extending from its confluence with the mesenteric vein into the proximal portion of the right portal vein occupying approximately 50% of the vessel lumen. No thrombus in these regions on the August 2019 study. Patent right anterior and left portal veins. Cirrhosis with small amount of ascites. A 4 cm heterogeneous parenchyma in the right hepatic dome, incompletely characterized, and possibly secondary to  the venous thrombosis or an underlying malignancy. A 5.2 cm gallbladder dilatation with dense sludge or tiny stones layering at the gallbladder neck, which could partially obstruct the cystic duct. No intrahepatic biliary duct dilatation. An 8 mm dilatation of the proximal common  bile duct with normal distal tapering. Pancreas: Diffuse moderate atrophy and fatty infiltration of the pancreas. No acute pancreatitis. Spleen: Splenomegaly measuring 15 by 6 by 14 cm, similar. A 9 mm and 10 mm hypoattenuating lesion in the splenic parenchyma, similarly sized compared to March 2019. Although these are incompletely characterized, their stability suggests a benign hemorrhagic or cystic etiology and no further imaging characterization is indicated according to the ACR criteria. Adrenals/Urinary Tract: Punctate nonobstructing right renal calculus. Stomach/Bowel: Apparent mild diffuse colonic wall thickening that could be due in part to the adjacent ascites. Mild diverticulosis in the distal descending and sigmoid colon. No bowel obstruction. Surgically absent or decompressed appendix. Vascular/Lymphatic: Possible small focal dependent thrombus versus CT telemetry lead artifact in the inferior cava intrahepatic portion with less than 25% luminal narrowing. Patent and ectatic splenic vein. Abdominal aorta calcified atherosclerosis without aneurysmal dilatation. Circumferential calcified atherosclerosis resulting in 75% luminal narrowing of the celiac artery and 50% narrowing of the superior mesenteric artery. An abnormally enlarged 34 by 15 mm periportal lymph node. Clustered interaortocaval and para-aortic nodes measuring up to 12 by 26 mm. Reproductive: Age-appropriate atrophy of the uterine myometrium. A 10 mm endometrial stripe thickness in the sagittal plane, abnormally thickened for an 79 year old female. Please note the patient medical record states a history of hysterectomy. Probable bilateral oophorectomy. No adnexal cyst or mass. Other: A 25 mm simple cyst in the right lateral hip subdermal subcutaneous fat plane. No associated inflammatory change or wall thickening. Musculoskeletal: Diffuse bone demineralization and moderate to advanced skeletal degenerative changes most pronounced at the  lumbosacral facets. A 13 mm L4 vertebral body hemangioma present since April 2015. I discussed the critical value/emergent results and further recommended imaging by telephone at the time of interpretation on 12/28/2019 at 7:55 pm with provider Dr. Francia Greaves , who verbally acknowledged these results. 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 Small focal nonocclusive thrombus in the intrahepatic inferior vena cava. These may be secondary to the patient's cirrhosis, however, correlation with lab values including an alpha fetoprotein marker may be useful. Partial characterization of heterogeneous parenchyma in the right hepatic dome concerning for a possible hepatocellular carcinoma. Dedicated MR or CT liver protocol imaging without and with intravenous contrast was recommended. Probable recent viral infection with mild bronchitis and trace pleural effusion in the left lower lobe, a mild uncomplicated pancolitis, and gallbladder hydrops, a 5.2 cm gallbladder dilatation with dense sludge or tiny stones layering at the gallbladder neck, which could partially obstruct the cystic duct. If there is concern for acute cholecystitis or cystic duct obstruction, hepatobiliary scintigraphy could be considered. Abnormally thickened endometrial stripe for an 79 year old female. Recommend further gynecological consultation and nonemergent pelvis ultrasonography. A nonspecific abnormally enlarged periportal lymph node and abnormally enlarged retroperitoneal lymph node with clustered smaller nodes, potentially reactive or metastatic. Punctate nonobstructing right renal calculus. Four-vessel coronary calcification. A 25 mm subdermal cyst in the right lateral hip, possibly a sebaceous cyst.  No associated inflammatory change. Thoracoabdominal aortic calcified atherosclerosis.  (ICD10-I70.0). Electronically Signed  By: Revonda Humphrey   On: 12/28/2019 20:21   US Paracentesis  Result Date: 12/31/2019 INDICATION: Patient with history NASH cirrhosis, colitis, chronic kidney disease, coronary artery disease, ascites. Request made for diagnostic and therapeutic paracentesis. EXAM: ULTRASOUND GUIDED DIAGNOSTIC AND THERAPEUTIC PARACENTESIS MEDICATIONS: None COMPLICATIONS: None immediate. PROCEDURE: Informed written consent was obtained from the patient after a discussion of the risks, benefits and alternatives to treatment. A timeout was performed prior to the initiation of the procedure. Initial ultrasound scanning demonstrates a small to moderate amount of ascites within the left mid to lower abdominal quadrant. The left mid to lower abdomen was prepped and draped in the usual sterile fashion. 1% lidocaine was used for local anesthesia. Following this, a 19 gauge, 10-cm, Yueh catheter was introduced. An ultrasound image was saved for documentation purposes. The paracentesis was performed. The catheter was removed and a dressing was applied. The patient tolerated the procedure well without immediate post procedural complication. FINDINGS: A total of approximately 1.5 liters of yellow fluid was removed. Samples were sent to the laboratory as requested by the clinical team. IMPRESSION: Successful ultrasound-guided diagnostic and therapeutic paracentesis yielding 1.5 liters of peritoneal fluid. Read by: Rowe [name], PA-C Electronically Signed   By: Jacqulynn Cadet M.D.   On: 12/31/2019 15:30   DG Chest Port 1 View  Result Date: 01/13/2020 CLINICAL DATA:  Shortness of breath and vomiting EXAM: PORTABLE CHEST 1 VIEW COMPARISON:  December 31, 2019 FINDINGS: The heart size and mediastinal contours are unchanged with mild cardiomegaly. Aortic knob calcifications. A right-sided MediPort catheter seen with the  tip at the superior cavoatrial junction. Probable subsegmental atelectasis seen at both lung bases. No large airspace consolidation or pleural effusion. IMPRESSION: Subsegmental atelectasis of both lung bases. Electronically Signed   By: Prudencio Pair M.D.   On: 01/13/2020 01:12   DG Chest Port 1 View  Result Date: 12/28/2019 CLINICAL DATA:  Weakness. EXAM: PORTABLE CHEST 1 VIEW COMPARISON:  August 10, 2019. FINDINGS: Stable cardiomegaly. No pneumothorax or pleural effusion is noted. Right lung is clear. Mild left lingular opacity is noted concerning for atelectasis or possibly infiltrate. Interval placement of right internal jugular Port-A-Cath with distal tip in expected position of cavoatrial junction. Bony thorax is unremarkable. IMPRESSION: Mild left lingular opacity is noted concerning for atelectasis or possibly infiltrate. Electronically Signed   By: Marijo Conception M.D.   On: 12/28/2019 16:11   CT LIVER ABDOMEN W WO CONTRAST  Result Date: 12/30/2019 CLINICAL DATA:  Inpatient. Cirrhosis. Possible right liver lesion on recent routine CT abdomen/pelvis study. EXAM: CT ABDOMEN WITHOUT AND WITH CONTRAST TECHNIQUE: Multidetector CT imaging of the abdomen was performed following the standard protocol before and following the bolus administration of intravenous contrast. CONTRAST:  150m OMNIPAQUE IOHEXOL 300 MG/ML  SOLN COMPARISON:  12/28/2019 CT abdomen/pelvis. FINDINGS: Lower chest: No significant pulmonary nodules or acute consolidative airspace disease. Superior approach central venous catheter tip seen at the cavoatrial junction. Coronary atherosclerosis. Hepatobiliary: Diffusely markedly irregular liver surface with slight relative hypertrophy of the lateral segment left liver lobe, compatible with advanced cirrhosis. There is heterogeneous density throughout the liver parenchyma on all sequences. On the precontrast sequence, there is an ill-defined geographic region of decreased attenuation  throughout the segment 7 right liver lobe (series 2/image 20), without arterial phase hyperenhancement or obvious portal venous washout (this region remains decreased in attenuation compared to the background parenchyma on all sequences), favoring heterogeneous hepatic steatosis. No discrete liver masses, specifically no foci of arterial  phase hyperenhancement or portal venous washout in the liver. Distended gallbladder (5.0 cm diameter). Layering small gallstones in the gallbladder. No definite gallbladder wall thickening. No biliary ductal dilatation. CBD diameter 6 mm. Pancreas: Normal, with no mass or duct dilation. Spleen: Mild splenomegaly. Craniocaudal splenic length 13.5 cm. No splenic mass. Adrenals/Urinary Tract: Normal adrenals. No renal stones. No hydronephrosis. No renal masses. Stomach/Bowel: Small hiatal hernia. Otherwise normal nondistended stomach. Visualized small and large bowel is normal caliber, with no bowel wall thickening. Vascular/Lymphatic: Atherosclerotic nonaneurysmal abdominal aorta. Patent hepatic, splenic and renal veins. 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 Stable small paraumbilical varices. No pathologically enlarged lymph nodes in the abdomen. Other: No pneumoperitoneum. Small volume abdominal ascites, similar. No focal fluid collections. Musculoskeletal: No aggressive appearing focal osseous lesions. Mild thoracolumbar spondylosis. IMPRESSION: 1. Advanced cirrhosis. Ill-defined geographic region of abnormal density in the segment 7 right liver lobe, with features favoring heterogeneous hepatic steatosis. No discrete liver masses, specifically no foci of arterial phase hyperenhancement or portal venous washout in the liver. Close imaging surveillance advised with MRI (strongly preferred) or CT abdomen without and with IV contrast in 3 months. 2. 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 3.  Stable small volume abdominal ascites. Mild splenomegaly. Small paraumbilical varices. 4. Cholelithiasis. Distended gallbladder, nonspecific. Correlate with ultrasound if there is clinical concern for acute cholecystitis. No biliary ductal dilatation. 5. Small hiatal hernia. 6. Aortic Atherosclerosis (ICD10-I70.0). Electronically Signed   By: Ilona Sorrel M.D.   On: 12/30/2019 11:09    Microbiology: Recent Results (from the past 240 hour(s))  SARS CORONAVIRUS 2 (TAT 6-24 HRS) Nasopharyngeal Nasopharyngeal Swab     Status: None   Collection Time: 01/23/20  5:08 PM   Specimen: Nasopharyngeal Swab  Result Value Ref Range Status   SARS Coronavirus 2 NEGATIVE NEGATIVE Final    Comment: (NOTE) SARS-CoV-2 target nucleic acids are NOT DETECTED. The SARS-CoV-2 RNA is generally detectable in upper and lower respiratory specimens during the acute phase of infection. Negative results do not preclude SARS-CoV-2 infection, do not rule out co-infections with other pathogens, and should not be used as the sole basis for treatment or other patient management decisions. Negative results must be combined with clinical observations, patient history, and epidemiological information. The expected result is Negative. Fact Sheet for Patients: SugarRoll.be Fact Sheet for Healthcare Providers: https://www.woods-mathews.com/ This test is not yet approved or cleared by the [place] FDA and  has been authorized for detection and/or diagnosis of SARS-CoV-2 by FDA under an Emergency Use Authorization (EUA). This EUA will remain  in effect (meaning this test can be used) for the duration of the COVID-19 declaration under Section 56 4(b)(1) of the Act, 21 U.S.C. section 360bbb-3(b)(1), unless the authorization is terminated or revoked sooner. Performed at [facility], [place] [address] [place], [place] [address]      Labs: CBC: Recent Labs  Lab 01/23/20 2019  01/24/20 0418 01/24/20 1238 01/24/20 1841 01/25/20 0445  WBC 34.3* 23.8* 26.6* 26.7* 20.0*  NEUTROABS  --   --  24.7*  --   --   HGB 8.1* 7.5* 8.0* 7.7* 7.7*  HCT 27.6* 25.4* 27.7* 26.3* 26.5*  MCV 98.2 96.6 97.2 97.0 98.9  PLT 319 229 272 285 329   Basic Metabolic Panel: Recent Labs  Lab 01/19/20 0352 01/21/20 0410 01/22/20 0437 01/23/20 0453 01/24/20 0418  NA 133* 136 135 136 138  K 3.6 3.8 4.0 4.0  3.8  CL 99 100 102 103 103  CO2 25 26 24 25 26   GLUCOSE 168* 135* 140* 141* 136*  BUN 15 13 13 12 12   CREATININE 0.90 0.90 0.99 1.19* 1.12*  CALCIUM 8.3* 8.5* 8.7* 8.5* 8.3*  MG  --   --  2.0  --  1.7   Liver Function Tests: Recent Labs  Lab 01/23/20 0453 01/24/20 0418  AST 31 33  ALT 12 13  ALKPHOS 112 88  BILITOT 1.4* 1.3*  PROT 4.8* 4.6*  ALBUMIN 2.3* 2.3*   No results for input(s): LIPASE, AMYLASE in the last 168 hours. No results for input(s): AMMONIA in the last 168 hours. Cardiac Enzymes: No results for input(s): CKTOTAL, CKMB, CKMBINDEX, TROPONINI in the last 168 hours. BNP (last 3 results) Recent Labs    08/10/19 1906  BNP 62.7   CBG: Recent Labs  Lab 01/24/20 0802 01/24/20 1238 01/24/20 1711 01/24/20 2208 01/25/20 0753  GLUCAP 131* 131* 128* 118* 126*    Time spent: 35 minutes  Signed:  Berle Mull  [facility]  01/25/2020 11:24 AM

## 2020-01-25 NOTE — TOC Transition Note (Signed)
Transition of Care [facility]) - CM/SW Discharge Note   Patient Details  Name: NEIDA ELLEGOOD MRN: [id number] Date of Birth: April 27, 1941  [facility] CM/SW Contact:  Alexander Mt, LCSW Phone Number: 01/25/2020, 12:03 PM   Clinical Narrative:    CSW met with pt and pt husband at bedside. They are aware that pt was approved by [facility] for SNF at [name]. CSW provided pt encouragement to do as much with therapy as she can moving forward to prevent denials moving forward. Pt states understanding. CSW to schedule PTAR for after lunch.   PTAR called for 1:30pm per RN Joaquim Lai request. DNR signed by MD, PTAR papers on chart.  Final next level of care: Skilled Nursing Facility Barriers to Discharge: Barriers Resolved   Patient Goals and CMS Choice Patient states their goals for this hospitalization and ongoing recovery are:: for her to get stronger and back walking CMS Medicare.gov Compare Post Acute Care list provided to:: Patient Represenative (must comment)(husband) Choice offered to / list presented to : Spouse  Discharge Placement   Existing PASRR number confirmed : 01/14/20          Patient chooses bed at: [facility] Patient to be transferred to facility by: [facility] Name of family member notified: pt husband [name] at bedside Patient and family notified of of transfer: 01/25/20  Discharge Plan and Services In-house Referral: Clinical Social Work Discharge Planning Services: CM Consult Post Acute Care Choice: [place]             Readmission Risk Interventions Readmission Risk Prevention Plan 01/17/2020  Transportation Screening Complete  PCP or Specialist Appt within 3-5 Days Not Complete  Not Complete comments plan for SNF  HRI or Home Care Consult Complete  Social Work Consult for [place] Planning/Counseling Complete  Palliative Care Screening Complete  Medication Review Press photographer) Referral to Pharmacy  Some recent data might be hidden

## 2020-01-25 NOTE — Progress Notes (Signed)
Report given to [employer], [place], Therapist, sports

## 2020-01-25 NOTE — TOC Progression Note (Signed)
Transition of Care [facility]) - Progression Note    Patient Details  Name: Amanda [name] MRN: [id number] Date of Birth: [birth year]  Transition of Care [facility]) CM/SW [place], [place] Phone Number: 01/25/2020, 10:31 AM  Clinical Narrative:    Per MD and SNF liaison the peer to peer has been completed, await the confirmation from SNF that they have gotten auth details to complete discharge. Requested updated discharge summary.  Expected Discharge Plan: Skilled Nursing Facility Barriers to Discharge: Continued Medical Work up  Expected Discharge Plan and Services Expected Discharge Plan: [place] In-house Referral: Clinical Social Work Discharge Planning Services: CM Consult Post Acute Care Choice: [place] arrangements for the past 2 months: Single Family Home Expected Discharge Date: 01/24/20               Readmission Risk Interventions Readmission Risk Prevention Plan 01/17/2020  Transportation Screening Complete  PCP or Specialist Appt within 3-5 Days Not Complete  Not Complete comments plan for SNF  HRI or Home Care Consult Complete  Social Work Consult for [place] Planning/Counseling Complete  Palliative Care Screening Complete  Medication Review Press photographer) Referral to Pharmacy  Some recent data might be hidden

## 2020-01-25 NOTE — Social Work (Signed)
Clinical Social Worker facilitated patient discharge including contacting patient family and facility to confirm patient discharge plans.  Clinical information faxed to facility and family agreeable with plan.  CSW arranged ambulance transport via PT[facility] to [facility]. RN to call [phone]  with report prior to discharge.  Clinical Social Worker will sign off for now as social work intervention is no longer needed. Please consult [place] again if new need arises.  Westley Hummer, MSW, LCSW Clinical Social Worker

## 2020-01-29 DIAGNOSIS — Z7901 Long term (current) use of anticoagulants: Secondary | ICD-10-CM | POA: Diagnosis not present

## 2020-01-29 DIAGNOSIS — N1831 Chronic kidney disease, stage 3a: Secondary | ICD-10-CM | POA: Diagnosis not present

## 2020-01-29 DIAGNOSIS — K7460 Unspecified cirrhosis of liver: Secondary | ICD-10-CM | POA: Diagnosis not present

## 2020-01-29 DIAGNOSIS — R627 Adult failure to thrive: Secondary | ICD-10-CM | POA: Diagnosis not present

## 2020-01-29 DIAGNOSIS — M6281 Muscle weakness (generalized): Secondary | ICD-10-CM | POA: Diagnosis not present

## 2020-01-29 DIAGNOSIS — D649 Anemia, unspecified: Secondary | ICD-10-CM | POA: Diagnosis not present

## 2020-01-29 DIAGNOSIS — K921 Melena: Secondary | ICD-10-CM | POA: Diagnosis not present

## 2020-01-29 DIAGNOSIS — F322 Major depressive disorder, single episode, severe without psychotic features: Secondary | ICD-10-CM | POA: Diagnosis not present

## 2020-01-29 NOTE — Telephone Encounter (Signed)
Pt released from hospital on 01/25/20 to   [facility] .   Service: Skilled Nursing Contact information: [address] [place] [phone]

## 2020-01-30 DIAGNOSIS — R451 Restlessness and agitation: Secondary | ICD-10-CM | POA: Diagnosis not present

## 2020-01-30 DIAGNOSIS — R197 Diarrhea, unspecified: Secondary | ICD-10-CM | POA: Diagnosis not present

## 2020-01-30 DIAGNOSIS — M6281 Muscle weakness (generalized): Secondary | ICD-10-CM | POA: Diagnosis not present

## 2020-01-30 DIAGNOSIS — F411 Generalized anxiety disorder: Secondary | ICD-10-CM | POA: Diagnosis not present

## 2020-01-30 DIAGNOSIS — F322 Major depressive disorder, single episode, severe without psychotic features: Secondary | ICD-10-CM | POA: Diagnosis not present

## 2020-01-30 DIAGNOSIS — R627 Adult failure to thrive: Secondary | ICD-10-CM | POA: Diagnosis not present

## 2020-01-30 DIAGNOSIS — K7460 Unspecified cirrhosis of liver: Secondary | ICD-10-CM | POA: Diagnosis not present

## 2020-01-30 DIAGNOSIS — R410 Disorientation, unspecified: Secondary | ICD-10-CM | POA: Diagnosis not present

## 2020-01-30 DIAGNOSIS — R112 Nausea with vomiting, unspecified: Secondary | ICD-10-CM | POA: Diagnosis not present

## 2020-01-31 DIAGNOSIS — I81 Portal vein thrombosis: Secondary | ICD-10-CM | POA: Diagnosis not present

## 2020-01-31 DIAGNOSIS — K7460 Unspecified cirrhosis of liver: Secondary | ICD-10-CM | POA: Diagnosis not present

## 2020-01-31 DIAGNOSIS — D471 Chronic myeloproliferative disease: Secondary | ICD-10-CM | POA: Diagnosis not present

## 2020-01-31 DIAGNOSIS — R627 Adult failure to thrive: Secondary | ICD-10-CM | POA: Diagnosis not present

## 2020-02-01 DIAGNOSIS — M6281 Muscle weakness (generalized): Secondary | ICD-10-CM | POA: Diagnosis not present

## 2020-02-01 DIAGNOSIS — F322 Major depressive disorder, single episode, severe without psychotic features: Secondary | ICD-10-CM | POA: Diagnosis not present

## 2020-02-01 DIAGNOSIS — F411 Generalized anxiety disorder: Secondary | ICD-10-CM | POA: Diagnosis not present

## 2020-02-01 DIAGNOSIS — R627 Adult failure to thrive: Secondary | ICD-10-CM | POA: Diagnosis not present

## 2020-02-01 DIAGNOSIS — K7460 Unspecified cirrhosis of liver: Secondary | ICD-10-CM | POA: Diagnosis not present

## 2020-02-01 DIAGNOSIS — R197 Diarrhea, unspecified: Secondary | ICD-10-CM | POA: Diagnosis not present

## 2020-02-08 ENCOUNTER — Other Ambulatory Visit: Payer: Self-pay

## 2020-02-08 ENCOUNTER — Non-Acute Institutional Stay: Payer: Medicare HMO | Admitting: Hospice

## 2020-02-08 DIAGNOSIS — Z515 Encounter for palliative care: Secondary | ICD-10-CM

## 2020-02-08 NOTE — Progress Notes (Signed)
[facility] Consult Note Telephone: [phone]  Fax: [phone]  PATIENT NAME: Amanda [name] DOB: 1941/01/03 MRN: [id number]  PRIMARY CARE PROVIDER:   [name], NP  REFERRING PROVIDER:  Martinique Blattenberger NP RESPONSIBLE PARTY:   Self Contact: Spouse- Jazzma Neidhardt [phone]   RECOMMENDATIONS/PLAN:   Advance Care Planning/Goals of Care: Telehealth Visit at the request of Martinique Blattenberger NP for palliative consult. Visit consisted of building trust and discussions on Palliative Medicine as specialized medical care for people living with serious illness, aimed at facilitating better quality of life through symptoms relief, assisting with advance care plan and establishing goals of care. Patient endorsed palliative services and affirmed she is a DNR. Signed DNR form in facility chart. Goals of care include to maximize quality of life and symptom management. Patient expressed desire to go back home. NP encouraged her to complete her therapy for her wellbeing. Spoke with spouse who appreciates palliative services and discussed his concerns because patient calls him to complain she is not taken care of. NP assured him patient was in no distress during visit. He said he also called facility and he was assured of care.  Symptom management: Per chart review, patient underwent EGD 01/21/2020  with finding of portal gastropathy, no esophageal varices. She was treated in the hospital with APC and Endo Clip for drift in hemoglobin and heme positive stool, no active bleed. She denied GI bleed, denied pain/discomfort. Last month patient was admitted in the ED for colitis, with decompensated cirrhosis. No report of diarrhea. Patient continues on Lactulose and is compliant with her medications - Aldactone, furosemide, Mirtazapine among others. Patient with ongoing weakness; working with PT/OT and making incremental progress, now able to walk up to 20  feet standby to contact assist. She is on med pass; Regular diet, Level 6 - Soft & Bite Sized texture, Regular Liquids consistency. She denied pain/discomfort and said she cannot wait to go back home. Nursing with no concerns.  Follow up: Palliative care will continue to follow patient for goals of care clarification and symptom management. I spent one hour and 25 minutes providing this consultation; time includes chart review and documentation. More than 50% of the time in this consultation was spent on coordinating communication  HISTORY OF PRESENT ILLNESS:  Amanda [name] is a 79 y.o. year old female with multiple medical problems including portal gastropathy, decompensated cirrhosis, CHD 3 Type2DM.  Palliative Care was asked to help address goals of care.   CODE STATUS: DNR  PPS: 40% HOSPICE ELIGIBILITY/DIAGNOSIS: TBD  PAST MEDICAL HISTORY:  Past Medical History:  Diagnosis Date  . Allergic rhinitis 01/23/2016  . C. difficile colitis   . CAD (coronary artery disease)    a. s/p STEMI in 01/2015 with 95% LCx stenosis and distal 80% LCx stenosis (DESx2 placed)  . Cancer ([place])    SKIN  . Candidiasis of skin 09/30/2014  . Cirrhosis ([place])   . Depression   . Diabetes mellitus 2008  . Gout   . Herpes simplex   . Hyperglycemia 05/31/2013  . Hyperlipidemia   . Hypertension   . Myocardial infarction ([place])   . 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 Obesity   . Polycythemia    Dr. Elease Hashimoto- [facility]  . Psoriasis     SOCIAL HX:  Social History   Tobacco Use  . Smoking status: Former Smoker    Years: 48.00    Types: Cigarettes  Quit date: 10/18/1986    Years since quitting: 33.3  . Smokeless tobacco: Never Used  Substance Use Topics  . Alcohol use: No    Alcohol/week: 0.0 standard drinks    ALLERGIES:  Allergies  Allergen Reactions  . Doxycycline Hives, Swelling and Rash  . Penicillins Swelling    Has patient had a PCN reaction causing immediate rash,  facial/tongue/throat swelling, SOB or lightheadedness with hypotension: No Has patient had a PCN reaction causing severe rash involving mucus membranes or skin necrosis: No Has patient had a PCN reaction that required hospitalization: No Has patient had a PCN reaction occurring within the last 10 years: No If all of the above answers are "NO", then may proceed with Cephalosporin use.  Marland Kitchen Lisinopril Cough  . Tape Hives  . Latex Hives, Itching and Rash     PERTINENT MEDICATIONS:  Outpatient Encounter Medications as of 02/08/2020  Medication Sig  . acetaminophen (TYLENOL) 325 MG tablet Take 2 tablets (650 mg total) by mouth every 6 (six) hours as needed for mild pain (or Fever >/= 101).  Marland Kitchen apixaban (ELIQUIS) 5 MG TABS tablet Take 1 tablet (5 mg total) by mouth 2 (two) times daily.  . Blood Glucose Monitoring Suppl (ACCU-CHEK GUIDE) w/Device KIT 1 kit by Does not apply route as directed.  . dronabinol (MARINOL) 2.5 MG capsule Take 1 capsule (2.5 mg total) by mouth 2 (two) times daily before lunch and supper.  . Dulaglutide (TRULICITY) 1.5 YN/8.2NF SOPN Inject 1.5 pens into the skin every Friday.   . ezetimibe (ZETIA) 10 MG tablet TAKE 1 TABLET(10 MG) BY MOUTH DAILY (Patient taking differently: Take 10 mg by mouth daily. )  . feeding supplement, ENSURE ENLIVE, (ENSURE ENLIVE) LIQD Take 237 mLs by mouth 2 (two) times daily between meals.  . furosemide (LASIX) 40 MG tablet TAKE 1 TABLET(40 MG) BY MOUTH DAILY (Patient taking differently: Take 40 mg by mouth daily. )  . glucose blood (ACCU-CHEK GUIDE) test strip Use to check blood sugars 3 times daily  . insulin aspart (NOVOLOG) 100 UNIT/ML injection Inject 0-15 Units into the skin 3 (three) times daily with meals. CBG 70 - 120: 0 units CBG 121 - 150: 2 units CBG 151 - 200: 3 units CBG 201 - 250: 5 units CBG 251 - 300: 8 units CBG 301 - 350: 11 units CBG 351 - 400: 15 units  . lactulose (CHRONULAC) 10 GM/15ML solution Take 30 mLs (20 g total) by  mouth 2 (two) times daily.  Marland Kitchen latanoprost (XALATAN) 0.005 % ophthalmic solution Place 1 drop into both eyes at bedtime.   . lidocaine-prilocaine (EMLA) cream Apply 1 application topically as needed. Please apply EMLA over the Port-A-Cath site 1 hour prior to coming to our office. (Patient taking differently: Apply 1 application topically daily as needed (for port). )  . mirtazapine (REMERON) 15 MG tablet Take 1 tablet (15 mg total) by mouth at bedtime.  Glory Rosebush VERIO test strip USE TO TEST BLOOD SUGAR THREE TIMES DAILY  . pantoprazole (PROTONIX) 40 MG tablet Take 1 tablet (40 mg total) by mouth 2 (two) times daily before a meal.  . spironolactone (ALDACTONE) 25 MG tablet Take 0.5 tablets (12.5 mg total) by mouth daily.  . timolol (TIMOPTIC) 0.5 % ophthalmic solution Place 1 drop into both eyes daily.   Marland Kitchen triamcinolone cream (KENALOG) 0.1 % Apply 1 application topically 2 (two) times daily. (Patient taking differently: Apply 1 application topically 2 (two) times daily as needed (for rask  skin). )   No facility-administered encounter medications on file as of 02/08/2020.    PHYSICAL EXAM/ROS:   General: NAD, cooperative Cardiovascular: regular rate and rhythm, denies chest pain Pulmonary: clear ant/post  fields Abdomen: soft, nontender, + bowel sounds, no diarrhea today GU: no suprapubic tenderness Extremities: no edema, no joint deformities Skin: no rashes to exposed skin Neurological: Weakness but otherwise nonfocal  Teodoro Spray, NP

## 2020-02-09 ENCOUNTER — Other Ambulatory Visit: Payer: Self-pay | Admitting: Cardiovascular Disease

## 2020-02-14 DIAGNOSIS — D649 Anemia, unspecified: Secondary | ICD-10-CM | POA: Diagnosis not present

## 2020-02-14 DIAGNOSIS — N1831 Chronic kidney disease, stage 3a: Secondary | ICD-10-CM | POA: Diagnosis not present

## 2020-02-14 DIAGNOSIS — K7460 Unspecified cirrhosis of liver: Secondary | ICD-10-CM | POA: Diagnosis not present

## 2020-02-14 DIAGNOSIS — F322 Major depressive disorder, single episode, severe without psychotic features: Secondary | ICD-10-CM | POA: Diagnosis not present

## 2020-02-14 DIAGNOSIS — R627 Adult failure to thrive: Secondary | ICD-10-CM | POA: Diagnosis not present

## 2020-02-14 DIAGNOSIS — F411 Generalized anxiety disorder: Secondary | ICD-10-CM | POA: Diagnosis not present

## 2020-02-15 DIAGNOSIS — I10 Essential (primary) hypertension: Secondary | ICD-10-CM | POA: Diagnosis not present

## 2020-02-15 DIAGNOSIS — M6281 Muscle weakness (generalized): Secondary | ICD-10-CM | POA: Diagnosis not present

## 2020-02-15 DIAGNOSIS — E440 Moderate protein-calorie malnutrition: Secondary | ICD-10-CM | POA: Diagnosis not present

## 2020-02-15 DIAGNOSIS — K7460 Unspecified cirrhosis of liver: Secondary | ICD-10-CM | POA: Diagnosis not present

## 2020-02-15 DIAGNOSIS — R627 Adult failure to thrive: Secondary | ICD-10-CM | POA: Diagnosis not present

## 2020-02-15 DIAGNOSIS — E1142 Type 2 diabetes mellitus with diabetic polyneuropathy: Secondary | ICD-10-CM | POA: Diagnosis not present

## 2020-02-15 DIAGNOSIS — N1831 Chronic kidney disease, stage 3a: Secondary | ICD-10-CM | POA: Diagnosis not present

## 2020-02-15 DIAGNOSIS — I2510 Atherosclerotic heart disease of native coronary artery without angina pectoris: Secondary | ICD-10-CM | POA: Diagnosis not present

## 2020-02-15 DIAGNOSIS — H409 Unspecified glaucoma: Secondary | ICD-10-CM | POA: Diagnosis not present

## 2020-02-16 ENCOUNTER — Other Ambulatory Visit: Payer: Self-pay

## 2020-02-16 ENCOUNTER — Emergency Department (HOSPITAL_COMMUNITY)
Admission: EM | Admit: 2020-02-16 | Discharge: 2020-02-16 | Disposition: A | Discharge status: Home / Self Care | Payer: Medicare HMO | Source: Home / Self Care | Attending: Emergency Medicine | Admitting: Emergency Medicine

## 2020-02-16 ENCOUNTER — Encounter (HOSPITAL_COMMUNITY): Payer: Self-pay | Admitting: Emergency Medicine

## 2020-02-16 DIAGNOSIS — K7581 Nonalcoholic steatohepatitis (NASH): Secondary | ICD-10-CM | POA: Diagnosis present

## 2020-02-16 DIAGNOSIS — N183 Chronic kidney disease, stage 3 unspecified: Secondary | ICD-10-CM | POA: Insufficient documentation

## 2020-02-16 DIAGNOSIS — N178 Other acute kidney failure: Secondary | ICD-10-CM | POA: Diagnosis not present

## 2020-02-16 DIAGNOSIS — R404 Transient alteration of awareness: Secondary | ICD-10-CM | POA: Diagnosis not present

## 2020-02-16 DIAGNOSIS — R41841 Cognitive communication deficit: Secondary | ICD-10-CM | POA: Diagnosis not present

## 2020-02-16 DIAGNOSIS — Z79899 Other long term (current) drug therapy: Secondary | ICD-10-CM | POA: Insufficient documentation

## 2020-02-16 DIAGNOSIS — R627 Adult failure to thrive: Secondary | ICD-10-CM | POA: Diagnosis present

## 2020-02-16 DIAGNOSIS — K2950 Unspecified chronic gastritis without bleeding: Secondary | ICD-10-CM | POA: Diagnosis present

## 2020-02-16 DIAGNOSIS — Z7401 Bed confinement status: Secondary | ICD-10-CM | POA: Diagnosis not present

## 2020-02-16 DIAGNOSIS — K2971 Gastritis, unspecified, with bleeding: Secondary | ICD-10-CM | POA: Diagnosis not present

## 2020-02-16 DIAGNOSIS — K228 Other specified diseases of esophagus: Secondary | ICD-10-CM | POA: Diagnosis present

## 2020-02-16 DIAGNOSIS — E118 Type 2 diabetes mellitus with unspecified complications: Secondary | ICD-10-CM | POA: Diagnosis not present

## 2020-02-16 DIAGNOSIS — Z794 Long term (current) use of insulin: Secondary | ICD-10-CM | POA: Insufficient documentation

## 2020-02-16 DIAGNOSIS — C946 Myelodysplastic disease, not classified: Secondary | ICD-10-CM | POA: Diagnosis present

## 2020-02-16 DIAGNOSIS — R2689 Other abnormalities of gait and mobility: Secondary | ICD-10-CM | POA: Diagnosis not present

## 2020-02-16 DIAGNOSIS — N179 Acute kidney failure, unspecified: Secondary | ICD-10-CM | POA: Diagnosis present

## 2020-02-16 DIAGNOSIS — D689 Coagulation defect, unspecified: Secondary | ICD-10-CM | POA: Diagnosis present

## 2020-02-16 DIAGNOSIS — K31819 Angiodysplasia of stomach and duodenum without bleeding: Secondary | ICD-10-CM | POA: Diagnosis not present

## 2020-02-16 DIAGNOSIS — K580 Irritable bowel syndrome with diarrhea: Secondary | ICD-10-CM | POA: Diagnosis present

## 2020-02-16 DIAGNOSIS — E1142 Type 2 diabetes mellitus with diabetic polyneuropathy: Secondary | ICD-10-CM | POA: Diagnosis present

## 2020-02-16 DIAGNOSIS — I998 Other disorder of circulatory system: Secondary | ICD-10-CM | POA: Diagnosis not present

## 2020-02-16 DIAGNOSIS — K31811 Angiodysplasia of stomach and duodenum with bleeding: Secondary | ICD-10-CM | POA: Diagnosis present

## 2020-02-16 DIAGNOSIS — K3189 Other diseases of stomach and duodenum: Secondary | ICD-10-CM | POA: Diagnosis present

## 2020-02-16 DIAGNOSIS — D471 Chronic myeloproliferative disease: Secondary | ICD-10-CM | POA: Diagnosis not present

## 2020-02-16 DIAGNOSIS — M2550 Pain in unspecified joint: Secondary | ICD-10-CM | POA: Diagnosis not present

## 2020-02-16 DIAGNOSIS — K7460 Unspecified cirrhosis of liver: Secondary | ICD-10-CM | POA: Diagnosis not present

## 2020-02-16 DIAGNOSIS — I5032 Chronic diastolic (congestive) heart failure: Secondary | ICD-10-CM | POA: Diagnosis present

## 2020-02-16 DIAGNOSIS — Z66 Do not resuscitate: Secondary | ICD-10-CM | POA: Diagnosis present

## 2020-02-16 DIAGNOSIS — K8020 Calculus of gallbladder without cholecystitis without obstruction: Secondary | ICD-10-CM | POA: Diagnosis not present

## 2020-02-16 DIAGNOSIS — E876 Hypokalemia: Secondary | ICD-10-CM | POA: Insufficient documentation

## 2020-02-16 DIAGNOSIS — R410 Disorientation, unspecified: Secondary | ICD-10-CM | POA: Diagnosis not present

## 2020-02-16 DIAGNOSIS — D62 Acute posthemorrhagic anemia: Secondary | ICD-10-CM | POA: Diagnosis present

## 2020-02-16 DIAGNOSIS — R4182 Altered mental status, unspecified: Secondary | ICD-10-CM | POA: Diagnosis not present

## 2020-02-16 DIAGNOSIS — D649 Anemia, unspecified: Secondary | ICD-10-CM | POA: Insufficient documentation

## 2020-02-16 DIAGNOSIS — Z91040 Latex allergy status: Secondary | ICD-10-CM | POA: Insufficient documentation

## 2020-02-16 DIAGNOSIS — I130 Hypertensive heart and chronic kidney disease with heart failure and stage 1 through stage 4 chronic kidney disease, or unspecified chronic kidney disease: Secondary | ICD-10-CM | POA: Diagnosis present

## 2020-02-16 DIAGNOSIS — R278 Other lack of coordination: Secondary | ICD-10-CM | POA: Diagnosis not present

## 2020-02-16 DIAGNOSIS — R0789 Other chest pain: Secondary | ICD-10-CM | POA: Diagnosis not present

## 2020-02-16 DIAGNOSIS — M6281 Muscle weakness (generalized): Secondary | ICD-10-CM | POA: Diagnosis not present

## 2020-02-16 DIAGNOSIS — R1312 Dysphagia, oropharyngeal phase: Secondary | ICD-10-CM | POA: Diagnosis not present

## 2020-02-16 DIAGNOSIS — K5521 Angiodysplasia of colon with hemorrhage: Secondary | ICD-10-CM | POA: Diagnosis not present

## 2020-02-16 DIAGNOSIS — K921 Melena: Secondary | ICD-10-CM | POA: Diagnosis present

## 2020-02-16 DIAGNOSIS — D6489 Other specified anemias: Secondary | ICD-10-CM | POA: Diagnosis not present

## 2020-02-16 DIAGNOSIS — I959 Hypotension, unspecified: Secondary | ICD-10-CM | POA: Diagnosis not present

## 2020-02-16 DIAGNOSIS — R1084 Generalized abdominal pain: Secondary | ICD-10-CM | POA: Diagnosis not present

## 2020-02-16 DIAGNOSIS — R2681 Unsteadiness on feet: Secondary | ICD-10-CM | POA: Diagnosis not present

## 2020-02-16 DIAGNOSIS — K2970 Gastritis, unspecified, without bleeding: Secondary | ICD-10-CM | POA: Diagnosis not present

## 2020-02-16 DIAGNOSIS — Z03818 Encounter for observation for suspected exposure to other biological agents ruled out: Secondary | ICD-10-CM | POA: Diagnosis not present

## 2020-02-16 DIAGNOSIS — Z20822 Contact with and (suspected) exposure to covid-19: Secondary | ICD-10-CM | POA: Diagnosis present

## 2020-02-16 DIAGNOSIS — D72829 Elevated white blood cell count, unspecified: Secondary | ICD-10-CM

## 2020-02-16 DIAGNOSIS — B965 Pseudomonas (aeruginosa) (mallei) (pseudomallei) as the cause of diseases classified elsewhere: Secondary | ICD-10-CM | POA: Diagnosis present

## 2020-02-16 DIAGNOSIS — E11319 Type 2 diabetes mellitus with unspecified diabetic retinopathy without macular edema: Secondary | ICD-10-CM | POA: Diagnosis present

## 2020-02-16 DIAGNOSIS — Z7901 Long term (current) use of anticoagulants: Secondary | ICD-10-CM | POA: Insufficient documentation

## 2020-02-16 DIAGNOSIS — E1122 Type 2 diabetes mellitus with diabetic chronic kidney disease: Secondary | ICD-10-CM | POA: Diagnosis present

## 2020-02-16 DIAGNOSIS — R531 Weakness: Secondary | ICD-10-CM | POA: Diagnosis not present

## 2020-02-16 DIAGNOSIS — K922 Gastrointestinal hemorrhage, unspecified: Secondary | ICD-10-CM | POA: Diagnosis not present

## 2020-02-16 DIAGNOSIS — N390 Urinary tract infection, site not specified: Secondary | ICD-10-CM | POA: Diagnosis present

## 2020-02-16 DIAGNOSIS — I81 Portal vein thrombosis: Secondary | ICD-10-CM | POA: Diagnosis present

## 2020-02-16 DIAGNOSIS — T183XXA Foreign body in small intestine, initial encounter: Secondary | ICD-10-CM | POA: Diagnosis not present

## 2020-02-16 DIAGNOSIS — R079 Chest pain, unspecified: Secondary | ICD-10-CM | POA: Diagnosis not present

## 2020-02-16 DIAGNOSIS — B9689 Other specified bacterial agents as the cause of diseases classified elsewhere: Secondary | ICD-10-CM | POA: Diagnosis present

## 2020-02-16 DIAGNOSIS — I10 Essential (primary) hypertension: Secondary | ICD-10-CM | POA: Diagnosis not present

## 2020-02-16 DIAGNOSIS — R112 Nausea with vomiting, unspecified: Secondary | ICD-10-CM | POA: Diagnosis not present

## 2020-02-16 DIAGNOSIS — R0602 Shortness of breath: Secondary | ICD-10-CM | POA: Diagnosis not present

## 2020-02-16 DIAGNOSIS — F29 Unspecified psychosis not due to a substance or known physiological condition: Secondary | ICD-10-CM | POA: Diagnosis not present

## 2020-02-16 DIAGNOSIS — Z87891 Personal history of nicotine dependence: Secondary | ICD-10-CM | POA: Insufficient documentation

## 2020-02-16 DIAGNOSIS — I2510 Atherosclerotic heart disease of native coronary artery without angina pectoris: Secondary | ICD-10-CM | POA: Insufficient documentation

## 2020-02-16 DIAGNOSIS — K7469 Other cirrhosis of liver: Secondary | ICD-10-CM | POA: Diagnosis present

## 2020-02-16 DIAGNOSIS — D45 Polycythemia vera: Secondary | ICD-10-CM | POA: Diagnosis present

## 2020-02-16 DIAGNOSIS — Z743 Need for continuous supervision: Secondary | ICD-10-CM | POA: Diagnosis not present

## 2020-02-16 LAB — COMPREHENSIVE METABOLIC PANEL
ALT: 11 U/L (ref 0–44)
AST: 36 U/L (ref 15–41)
Albumin: 2.6 g/dL — ABNORMAL LOW (ref 3.5–5.0)
Alkaline Phosphatase: 101 U/L (ref 38–126)
Anion gap: 16 — ABNORMAL HIGH (ref 5–15)
BUN: 19 mg/dL (ref 8–23)
CO2: 22 mmol/L (ref 22–32)
Calcium: 8.3 mg/dL — ABNORMAL LOW (ref 8.9–10.3)
Chloride: 101 mmol/L (ref 98–111)
Creatinine, Ser: 1.34 mg/dL — ABNORMAL HIGH (ref 0.44–1.00)
GFR calc Af Amer: 43 mL/min — ABNORMAL LOW (ref >60)
GFR calc non Af Amer: 37 mL/min — ABNORMAL LOW (ref >60)
Glucose, Bld: 131 mg/dL — ABNORMAL HIGH (ref 70–99)
Potassium: 2.6 mmol/L — CL (ref 3.5–5.1)
Sodium: 139 mmol/L (ref 135–145)
Total Bilirubin: 2 mg/dL — ABNORMAL HIGH (ref 0.3–1.2)
Total Protein: 5.1 g/dL — ABNORMAL LOW (ref 6.5–8.1)

## 2020-02-16 LAB — CBC
HCT: 30.8 % — ABNORMAL LOW (ref 36.0–46.0)
Hemoglobin: 7.9 g/dL — ABNORMAL LOW (ref 12.0–15.0)
MCH: 26.9 pg (ref 26.0–34.0)
MCHC: 25.6 g/dL — ABNORMAL LOW (ref 30.0–36.0)
MCV: 104.8 fL — ABNORMAL HIGH (ref 80.0–100.0)
Platelets: 333 10*3/uL (ref 150–400)
RBC: 2.94 MIL/uL — ABNORMAL LOW (ref 3.87–5.11)
RDW: 21.2 % — ABNORMAL HIGH (ref 11.5–15.5)
WBC: 29.9 10*3/uL — ABNORMAL HIGH (ref 4.0–10.5)
nRBC: 0.4 % — ABNORMAL HIGH (ref 0.0–0.2)

## 2020-02-16 LAB — PROTIME-INR
INR: 3.4 — ABNORMAL HIGH (ref 0.8–1.2)
Prothrombin Time: 33.3 seconds — ABNORMAL HIGH (ref 11.4–15.2)

## 2020-02-16 LAB — MAGNESIUM: Magnesium: 1.6 mg/dL — ABNORMAL LOW (ref 1.7–2.4)

## 2020-02-16 MED ORDER — MAGNESIUM OXIDE 400 (241.3 MG) MG PO TABS: 400.0000 mg | ORAL_TABLET | Freq: Once | ORAL | Status: DC

## 2020-02-16 MED ORDER — POTASSIUM CHLORIDE 10 MEQ/100ML IV SOLN
10.0000 meq | INTRAVENOUS | Status: AC
  Administered 2020-02-16 (×2): 10 meq via INTRAVENOUS
  Filled 2020-02-16 (×2): qty 100

## 2020-02-16 MED ORDER — CHLORHEXIDINE GLUCONATE CLOTH 2 % EX PADS: 6.0000 | MEDICATED_PAD | Freq: Every day | CUTANEOUS | Status: DC

## 2020-02-16 MED ORDER — SODIUM CHLORIDE 0.9% FLUSH
10.0000 mL | Freq: Two times a day (BID) | INTRAVENOUS | Status: DC
  Administered 2020-02-16: 10 mL

## 2020-02-16 MED ORDER — POTASSIUM CHLORIDE CRYS ER 20 MEQ PO TBCR
EXTENDED_RELEASE_TABLET | ORAL | 0 refills | Status: DC
Start: 2020-02-16 — End: 2020-02-21

## 2020-02-16 MED ORDER — HEPARIN SOD (PORK) LOCK FLUSH 100 UNIT/ML IV SOLN
500.0000 [IU] | Freq: Once | INTRAVENOUS | Status: AC
  Administered 2020-02-16: 500 [IU] via INTRAVENOUS
  Filled 2020-02-16: qty 5

## 2020-02-16 MED ORDER — SODIUM CHLORIDE 0.9% FLUSH: 10.0000 mL | INTRAVENOUS | Status: DC | PRN

## 2020-02-16 MED ORDER — POTASSIUM CHLORIDE CRYS ER 20 MEQ PO TBCR
40.0000 meq | EXTENDED_RELEASE_TABLET | Freq: Once | ORAL | Status: AC
  Administered 2020-02-16: 40 meq via ORAL
  Filled 2020-02-16: qty 2

## 2020-02-16 NOTE — ED Provider Notes (Signed)
[facility] [facility] EMERGENCY DEPARTMENT Provider Note   CSN: [id number] Arrival date & time: 02/16/20  1217     History Chief Complaint  Patient presents with  . Anemia    Amanda [name] is a 79 y.o. female.  Patient w hx anemia, presents via EMS from [facility] with report of hgb 6.6. symptoms acute onset, moderate, persistent, noted to be worse today. Patient denies specific new symptoms. No chest pain or discomfort. No sob or new/unusual doe. No abd pain or nvd. No melena or rectal bleeding. No vaginal bleeding. Denies faintness or dizziness. Patient w recent egd, noted to have duodenal avms.   The history is provided by the patient, the EMS personnel and the nursing home.       Past Medical History:  Diagnosis Date  . Allergic rhinitis 01/23/2016  . C. difficile colitis   . CAD (coronary artery disease)    a. s/p STEMI in 01/2015 with 95% LCx stenosis and distal 80% LCx stenosis (DESx2 placed)  . Cancer ([place])    SKIN  . Candidiasis of skin 09/30/2014  . Cirrhosis ([place])   . Depression   . Diabetes mellitus 2008  . Gout   . Herpes simplex   . Hyperglycemia 05/31/2013  . Hyperlipidemia   . Hypertension   . Myocardial infarction ([place])   . Neuromuscular disorder ([place])    BELL PALSY  . Obesity   . Polycythemia    Dr. Elease Hashimoto- [facility]  . Psoriasis     Patient Active Problem List   Diagnosis Date Noted  . Weakness generalized   . AVM (arteriovenous malformation) of small bowel, acquired with hemorrhage   . Malnutrition of moderate degree 01/16/2020  . Goals of care, counseling/discussion   . Palliative care by specialist   . Nausea & vomiting 01/13/2020  . Failure to thrive in adult 01/13/2020  . Myeloproliferative disorder ([place]) 01/13/2020  . Portal vein thrombosis   . SIRS (systemic inflammatory response syndrome) ([place]) 12/28/2019  . Worsening lower extremity weakness 12/14/2019  . Leg swelling 08/21/2019  . Hypotension due to hypovolemia 08/10/2019    . CKD (chronic kidney disease), stage III 08/10/2019  . Acute blood loss anemia 05/23/2018  . Dehydration 03/22/2018  . History of CVA (cerebrovascular accident) 03/22/2018  . Hip injury, left, subsequent encounter 11/08/2017  . Iron deficiency anemia 12/22/2016  . OSA (obstructive sleep apnea) 04/21/2016  . Osteopenia 04/07/2016  . Lung nodule, solitary 02/05/2016  . Aortic dilatation ([place]) 02/05/2016  . Dyspnea 01/28/2016  . Cirrhosis ([place]) 01/28/2016  . Allergic rhinitis 01/23/2016  . Hepatic cirrhosis ([place]) 10/28/2015  . Irritable bowel syndrome 08/12/2015  . Obesity (BMI 30-39.9) 04/30/2015  . Diabetic retinopathy of both eyes ([place]) 04/18/2015  . Former smoker 04/18/2015  . GAD (generalized anxiety disorder) 04/18/2015  . Status post primary angioplasty with coronary stent 04/18/2015  . Coronary artery disease involving native coronary artery 03/01/2015  . Chronic diastolic CHF (congestive heart failure) ([place]) 02/01/2015  . ST elevation myocardial infarction (STEMI) involving left circumflex coronary artery in recovery phase ([place]) 01/27/2015  . Idioventricular rhythm ([place])   . Diabetic peripheral neuropathy associated with type 2 diabetes mellitus ([place]) 01/23/2015  . Type 2 diabetes mellitus with diabetic polyneuropathy ([place]) 01/23/2015  . Thrombocytosis ([place]) 07/04/2014  . Cholelithiasis 02/07/2014  . Chronic diarrhea 01/29/2014  . Leukocytosis 06/02/2013  . [name] ([place]) 05/31/2013  . Essential hypertension 05/31/2013  . History of Bell's palsy 05/26/2013  . DERMATITIS, ATOPIC 11/16/2010  .  DIZZINESS 11/16/2010  . DIASTOLIC DYSFUNCTION 42/35/3614  . Hyperlipidemia 12/15/2009  . Essential hypertension, benign 11/24/2009  . PSORIASIS 11/24/2009  . Depression 09/13/2009  . 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 ANKLE FRACTURE SURGERY    . BIOPSY  05/26/2018   Procedure:  BIOPSY;  Surgeon: [name], MD;  Location: [facility] ENDOSCOPY;  Service: Endoscopy;;  . BREAST SURGERY Left    milk duct  . CATARACT EXTRACTION, BILATERAL    . COLONOSCOPY    . ESOPHAGOGASTRODUODENOSCOPY N/A 05/26/2018   Procedure: ESOPHAGOGASTRODUODENOSCOPY (EGD);  Surgeon: [name], MD;  Location: [facility] ENDOSCOPY;  Service: Endoscopy;  Laterality: N/A;  . ESOPHAGOGASTRODUODENOSCOPY (EGD) WITH PROPOFOL N/A 01/21/2020   Procedure: ESOPHAGOGASTRODUODENOSCOPY (EGD) WITH PROPOFOL;  Surgeon: Yetta Flock, MD;  Location: [facility];  Service: Gastroenterology;  Laterality: N/A;  . HEMOSTASIS CLIP PLACEMENT  01/21/2020   Procedure: HEMOSTASIS CLIP PLACEMENT;  Surgeon: Yetta Flock, MD;  Location: [facility] ENDOSCOPY;  Service: Gastroenterology;;  . HOT HEMOSTASIS N/A 01/21/2020   Procedure: HOT HEMOSTASIS (ARGON PLASMA COAGULATION/BICAP);  Surgeon: Yetta Flock, MD;  Location: [facility] ENDOSCOPY;  Service: Gastroenterology;  Laterality: N/A;  . IR IMAGING GUIDED PORT INSERTION  08/30/2019  . IR PARACENTESIS  05/25/2018  . LEFT HEART CATHETERIZATION WITH CORONARY ANGIOGRAM N/A 01/27/2015   Procedure: LEFT HEART CATHETERIZATION WITH CORONARY ANGIOGRAM;  Surgeon: [name], MD;  Location: [facility] CATH LAB;  Service: Cardiovascular;  Laterality: N/A;  . TONSILLECTOMY    . TOTAL ABDOMINAL HYSTERECTOMY W/ BILATERAL SALPINGOOPHORECTOMY     for heavy periods with appendectomy     OB History   No obstetric history on file.     Family History  Problem Relation Age of Onset  . Diabetes Mother   . Heart disease Mother        CAD  . Hyperlipidemia Mother   . Hypertension Mother   . Kidney disease Mother   . Kidney disease Father   . Breast cancer Maternal Aunt   . Heart disease Maternal Grandmother   . Heart disease Other        maternal aunts and uncles  . Colon cancer Neg Hx   . Esophageal cancer Neg Hx     Social History   Tobacco Use  . Smoking status: Former Smoker    Years:  48.00    Types: Cigarettes    Quit date: 10/18/1986    Years since quitting: 33.3  . Smokeless tobacco: Never Used  Substance Use Topics  . Alcohol use: No    Alcohol/week: 0.0 standard drinks  . Drug use: No    Home Medications Prior to Admission medications   Medication Sig Start Date End Date Taking? Authorizing Provider  acetaminophen (TYLENOL) 325 MG tablet Take 2 tablets (650 mg total) by mouth every 6 (six) hours as needed for mild pain (or Fever >/= 101). 05/27/18   Georgette Shell, MD  apixaban (ELIQUIS) 5 MG TABS tablet Take 1 tablet (5 mg total) by mouth 2 (two) times daily. 01/10/20 04/09/20  Nafziger, Tommi Rumps, NP  Blood Glucose Monitoring Suppl (ACCU-CHEK GUIDE) w/Device KIT 1 kit by Does not apply route as directed. 01/25/18   [name], MD  dronabinol (MARINOL) 2.5 MG capsule Take 1 capsule (2.5 mg total) by mouth 2 (two) times daily before lunch and supper. 01/24/20   Lavina Hamman, MD  Dulaglutide (TRULICITY) 1.5 ER/1.5QM SOPN Inject 1.5 pens into the skin  every Friday.     [provider]  ezetimibe (ZETIA) 10 MG tablet Take 1 tablet (10 mg total) by mouth daily. 02/13/20   [name], MD  feeding supplement, ENSURE ENLIVE, (ENSURE ENLIVE) LIQD Take 237 mLs by mouth 2 (two) times daily between meals. 01/24/20   Lavina Hamman, MD  furosemide (LASIX) 40 MG tablet TAKE 1 TABLET(40 MG) BY MOUTH DAILY Patient taking differently: Take 40 mg by mouth daily.  10/16/19   [name], MD  glucose blood (ACCU-CHEK GUIDE) test strip Use to check blood sugars 3 times daily 01/25/18   [name], MD  insulin aspart (NOVOLOG) 100 UNIT/ML injection Inject 0-15 Units into the skin 3 (three) times daily with meals. CBG 70 - 120: 0 units CBG 121 - 150: 2 units CBG 151 - 200: 3 units CBG 201 - 250: 5 units CBG 251 - 300: 8 units CBG 301 - 350: 11 units CBG 351 - 400: 15 units 01/24/20   Lavina Hamman, MD  lactulose (CHRONULAC) 10 GM/15ML solution Take 30 mLs  (20 g total) by mouth 2 (two) times daily. 01/24/20   Lavina Hamman, MD  latanoprost (XALATAN) 0.005 % ophthalmic solution Place 1 drop into both eyes at bedtime.  03/23/19   [provider]  lidocaine-prilocaine (EMLA) cream Apply 1 application topically as needed. Please apply EMLA over the Port-A-Cath site 1 hour prior to coming to our office. Patient taking differently: Apply 1 application topically daily as needed (for port).  08/17/19   Volanda Napoleon, MD  mirtazapine (REMERON) 15 MG tablet Take 1 tablet (15 mg total) by mouth at bedtime. 08/17/19   Nafziger, Tommi Rumps, NP  ONETOUCH VERIO test strip USE TO TEST BLOOD SUGAR THREE TIMES DAILY 05/01/18   [name], MD  pantoprazole (PROTONIX) 40 MG tablet Take 1 tablet (40 mg total) by mouth 2 (two) times daily before a meal. 01/24/20   Lavina Hamman, MD  spironolactone (ALDACTONE) 25 MG tablet Take 0.5 tablets (12.5 mg total) by mouth daily. 01/25/20   Lavina Hamman, MD  timolol (TIMOPTIC) 0.5 % ophthalmic solution Place 1 drop into both eyes daily.  10/07/19   [provider]  triamcinolone cream (KENALOG) 0.1 % Apply 1 application topically 2 (two) times daily. Patient taking differently: Apply 1 application topically 2 (two) times daily as needed (for rask skin).  12/11/19   Nafziger, Tommi Rumps, NP    Allergies    Doxycycline, Penicillins, Lisinopril, Tape, and Latex  Review of Systems   Review of Systems  Constitutional: Negative for fever.  HENT: Negative for sore throat.   Eyes: Negative for redness.  Respiratory: Negative for cough and shortness of breath.   Cardiovascular: Negative for chest pain.  Gastrointestinal: Negative for abdominal pain, blood in stool and vomiting.  Endocrine: Negative for polyuria.  Genitourinary: Negative for flank pain.  Musculoskeletal: Negative for back pain and neck pain.  Skin: Negative for rash.  Neurological: Negative for headaches.  Hematological: Does not bruise/bleed easily.    Psychiatric/Behavioral: Negative for confusion.    Physical Exam Updated Vital Signs BP (!) 105/51   Pulse 88   Temp (!) 97.5 F (36.4 C) (Oral)   Resp (!) 31   Ht 1.638 m (5' 4.5")   Wt 86.2 kg   SpO2 94%   BMI 32.11 kg/m   Physical Exam Vitals and nursing note reviewed.  Constitutional:      Appearance: Normal appearance. She is well-developed.  HENT:     Head: Atraumatic.     Nose: Nose normal.     Mouth/Throat:     Mouth: Mucous membranes are moist.  Eyes:     General: No scleral icterus.    Conjunctiva/sclera: Conjunctivae normal.  Neck:     Trachea: No tracheal deviation.  Cardiovascular:     Rate and Rhythm: Normal rate and regular rhythm.     Pulses: Normal pulses.     Heart sounds: Normal heart sounds. No murmur. No friction rub. No gallop.   Pulmonary:     Effort: Pulmonary effort is normal. No respiratory distress.     Breath sounds: Normal breath sounds.  Abdominal:     General: Bowel sounds are normal. There is no distension.     Palpations: Abdomen is soft.     Tenderness: There is no abdominal tenderness. There is no guarding.  Genitourinary:    Comments: No cva tenderness.  Musculoskeletal:        General: No swelling.     Cervical back: Normal range of motion and neck supple. No rigidity. No muscular tenderness.  Skin:    General: Skin is warm and dry.     Findings: No rash.  Neurological:     Mental Status: She is alert.     Comments: Alert, speech normal.   Psychiatric:        Mood and Affect: Mood normal.     ED Results / Procedures / Treatments   Labs (all labs ordered are listed, but only abnormal results are displayed) Results for orders placed or performed during the hospital encounter of 02/16/20  CBC  Result Value Ref Range   WBC 29.9 (H) 4.0 - 10.5 K/uL   RBC 2.94 (L) 3.87 - 5.11 MIL/uL   Hemoglobin 7.9 (L) 12.0 - 15.0 g/dL   HCT 30.8 (L) 36.0 - 46.0 %   MCV 104.8 (H) 80.0 - 100.0 fL   MCH 26.9 26.0 - 34.0 pg   MCHC  25.6 (L) 30.0 - 36.0 g/dL   RDW 21.2 (H) 11.5 - 15.5 %   Platelets 333 150 - 400 K/uL   nRBC 0.4 (H) 0.0 - 0.2 %  Comprehensive metabolic panel  Result Value Ref Range   Sodium 139 135 - 145 mmol/L   Potassium 2.6 (LL) 3.5 - 5.1 mmol/L   Chloride 101 98 - 111 mmol/L   CO2 22 22 - 32 mmol/L   Glucose, Bld 131 (H) 70 - 99 mg/dL   BUN 19 8 - 23 mg/dL   Creatinine, Ser 1.34 (H) 0.44 - 1.00 mg/dL   Calcium 8.3 (L) 8.9 - 10.3 mg/dL   Total Protein 5.1 (L) 6.5 - 8.1 g/dL   Albumin 2.6 (L) 3.5 - 5.0 g/dL   AST 36 15 - 41 U/L   ALT 11 0 - 44 U/L   Alkaline Phosphatase 101 38 - 126 U/L   Total Bilirubin 2.0 (H) 0.3 - 1.2 mg/dL   GFR calc non Af Amer 37 (L) >60 mL/min   GFR calc Af Amer 43 (L) >60 mL/min   Anion gap 16 (H) 5 - 15  Protime-INR  Result Value Ref Range   Prothrombin Time 33.3 (H) 11.4 - 15.2 seconds   INR 3.4 (H) 0.8 - 1.2  Type and screen  Result Value Ref Range   ABO/RH(D) O POS    Antibody Screen NEG    Sample Expiration      02/19/2020,2359 Performed at [facility],  [address], [place], [place] [address]    *Note: Due to a large number of results and/or encounters for the requested time period, some results have not been displayed. A complete set of results can be found in Results Review.   EKG None  Radiology No results found.  Procedures Procedures (including critical care time)  Medications Ordered in ED Medications  sodium chloride flush (NS) 0.9 % injection 10-40 mL (10 mLs Intracatheter Given 02/16/20 1359)  sodium chloride flush (NS) 0.9 % injection 10-40 mL (has no administration in time range)  Chlorhexidine Gluconate Cloth 2 % PADS 6 each (has no administration in time range)    ED Course  I have reviewed the triage vital signs and the nursing notes.  Pertinent labs & imaging results that were available during my care of the patient were reviewed by me and considered in my medical decision making (see chart for details).    MDM  Rules/Calculators/A&P                     Iv ns. Labs sent.   Reviewed nursing notes and prior charts for additional history. Recent inpatient admission reviewed - egd then reviewed.   Labs from ecf reviewed/interpreted by me - hgb very low, 6.6 then, will recheck this AM.  Initial labs reviewed/interpreted by me - hgb 7.9, c/w prior/baseline. Wbc is also high, c/w prior, and c/w known myeloproliferative disorder.  Pt requests d/c, states does not want transfusion if hgb c/w baseline, and does not want to be admitted in any case, requests to go home.   Additional labs reviewed/interpreted by me - k is low. Kcl iv and po. Mg added to labs.   Recheck pt, no faintness or dizziness. No cp or discomfort. No sob. Pt continues to request to go home once meds given.        Final Clinical Impression(s) / ED Diagnoses Final diagnoses:  None    Rx / DC Orders ED Discharge Orders    None       Lajean Saver, MD 02/17/20 1615

## 2020-02-16 NOTE — Discharge Instructions (Signed)
It was our pleasure to provide your ER care today - we hope that you feel better.  From today's labs, your potassium level is low (2.6) - take potassium supplement as prescribed, eat plenty of fruits and vegetables, and follow up with your doctor in the coming week - call office MOndday to arrange appointment.   Also follow up with your doctor regarding your chronic anemia, and myeloproliferative condition.   Return to ER if worse, new symptoms, fevers, trouble breathing, weak/faint, or other concern.

## 2020-02-16 NOTE — ED Notes (Addendum)
Patient given discharge instructions. Questions were answered. Patient's husband verbalized understanding of discharge instructions and care at home.

## 2020-02-16 NOTE — ED Triage Notes (Signed)
Pt arrives from [facility]) via ems due to low hgb. Per ems, the facility was preparing to discharge the patient home, but when they completed her bloodwork, her hgb was 6.6. Arrives for blood transfusion.

## 2020-02-17 ENCOUNTER — Encounter (HOSPITAL_COMMUNITY): Payer: Self-pay | Admitting: Emergency Medicine

## 2020-02-17 ENCOUNTER — Other Ambulatory Visit: Payer: Self-pay

## 2020-02-17 ENCOUNTER — Encounter: Payer: Self-pay | Admitting: Adult Health

## 2020-02-17 ENCOUNTER — Inpatient Hospital Stay (HOSPITAL_COMMUNITY)
Admission: EM | Admit: 2020-02-17 | Discharge: 2020-02-21 | DRG: 377 | Disposition: A | Discharge status: Skilled Nursing Facility | Payer: Medicare HMO | Source: Skilled Nursing Facility | Attending: Internal Medicine | Admitting: Internal Medicine

## 2020-02-17 ENCOUNTER — Emergency Department (HOSPITAL_COMMUNITY): Payer: Medicare HMO

## 2020-02-17 DIAGNOSIS — N1832 Chronic kidney disease, stage 3b: Secondary | ICD-10-CM | POA: Diagnosis present

## 2020-02-17 DIAGNOSIS — K5521 Angiodysplasia of colon with hemorrhage: Secondary | ICD-10-CM | POA: Diagnosis not present

## 2020-02-17 DIAGNOSIS — I2510 Atherosclerotic heart disease of native coronary artery without angina pectoris: Secondary | ICD-10-CM | POA: Diagnosis present

## 2020-02-17 DIAGNOSIS — R161 Splenomegaly, not elsewhere classified: Secondary | ICD-10-CM | POA: Diagnosis present

## 2020-02-17 DIAGNOSIS — R627 Adult failure to thrive: Secondary | ICD-10-CM | POA: Diagnosis present

## 2020-02-17 DIAGNOSIS — N390 Urinary tract infection, site not specified: Secondary | ICD-10-CM | POA: Diagnosis present

## 2020-02-17 DIAGNOSIS — Z66 Do not resuscitate: Secondary | ICD-10-CM | POA: Diagnosis present

## 2020-02-17 DIAGNOSIS — D689 Coagulation defect, unspecified: Secondary | ICD-10-CM | POA: Diagnosis present

## 2020-02-17 DIAGNOSIS — Z87891 Personal history of nicotine dependence: Secondary | ICD-10-CM

## 2020-02-17 DIAGNOSIS — Z85828 Personal history of other malignant neoplasm of skin: Secondary | ICD-10-CM

## 2020-02-17 DIAGNOSIS — I130 Hypertensive heart and chronic kidney disease with heart failure and stage 1 through stage 4 chronic kidney disease, or unspecified chronic kidney disease: Secondary | ICD-10-CM | POA: Diagnosis present

## 2020-02-17 DIAGNOSIS — Z90722 Acquired absence of ovaries, bilateral: Secondary | ICD-10-CM

## 2020-02-17 DIAGNOSIS — I10 Essential (primary) hypertension: Secondary | ICD-10-CM | POA: Diagnosis present

## 2020-02-17 DIAGNOSIS — I5032 Chronic diastolic (congestive) heart failure: Secondary | ICD-10-CM | POA: Diagnosis present

## 2020-02-17 DIAGNOSIS — N1831 Chronic kidney disease, stage 3a: Secondary | ICD-10-CM | POA: Diagnosis present

## 2020-02-17 DIAGNOSIS — Z833 Family history of diabetes mellitus: Secondary | ICD-10-CM

## 2020-02-17 DIAGNOSIS — Z955 Presence of coronary angioplasty implant and graft: Secondary | ICD-10-CM

## 2020-02-17 DIAGNOSIS — Z794 Long term (current) use of insulin: Secondary | ICD-10-CM

## 2020-02-17 DIAGNOSIS — K922 Gastrointestinal hemorrhage, unspecified: Secondary | ICD-10-CM | POA: Diagnosis present

## 2020-02-17 DIAGNOSIS — K228 Other specified diseases of esophagus: Secondary | ICD-10-CM | POA: Diagnosis present

## 2020-02-17 DIAGNOSIS — E11319 Type 2 diabetes mellitus with unspecified diabetic retinopathy without macular edema: Secondary | ICD-10-CM | POA: Diagnosis present

## 2020-02-17 DIAGNOSIS — K31811 Angiodysplasia of stomach and duodenum with bleeding: Secondary | ICD-10-CM | POA: Diagnosis present

## 2020-02-17 DIAGNOSIS — K7460 Unspecified cirrhosis of liver: Secondary | ICD-10-CM | POA: Diagnosis not present

## 2020-02-17 DIAGNOSIS — K921 Melena: Secondary | ICD-10-CM | POA: Diagnosis present

## 2020-02-17 DIAGNOSIS — I252 Old myocardial infarction: Secondary | ICD-10-CM

## 2020-02-17 DIAGNOSIS — C946 Myelodysplastic disease, not classified: Secondary | ICD-10-CM | POA: Diagnosis present

## 2020-02-17 DIAGNOSIS — I81 Portal vein thrombosis: Secondary | ICD-10-CM | POA: Diagnosis present

## 2020-02-17 DIAGNOSIS — L409 Psoriasis, unspecified: Secondary | ICD-10-CM | POA: Diagnosis present

## 2020-02-17 DIAGNOSIS — Z9049 Acquired absence of other specified parts of digestive tract: Secondary | ICD-10-CM

## 2020-02-17 DIAGNOSIS — Z20822 Contact with and (suspected) exposure to covid-19: Secondary | ICD-10-CM | POA: Diagnosis present

## 2020-02-17 DIAGNOSIS — K580 Irritable bowel syndrome with diarrhea: Secondary | ICD-10-CM | POA: Diagnosis present

## 2020-02-17 DIAGNOSIS — Z9842 Cataract extraction status, left eye: Secondary | ICD-10-CM

## 2020-02-17 DIAGNOSIS — M8580 Other specified disorders of bone density and structure, unspecified site: Secondary | ICD-10-CM | POA: Diagnosis present

## 2020-02-17 DIAGNOSIS — K3189 Other diseases of stomach and duodenum: Secondary | ICD-10-CM | POA: Diagnosis present

## 2020-02-17 DIAGNOSIS — Z8349 Family history of other endocrine, nutritional and metabolic diseases: Secondary | ICD-10-CM

## 2020-02-17 DIAGNOSIS — B9689 Other specified bacterial agents as the cause of diseases classified elsewhere: Secondary | ICD-10-CM | POA: Diagnosis present

## 2020-02-17 DIAGNOSIS — E1142 Type 2 diabetes mellitus with diabetic polyneuropathy: Secondary | ICD-10-CM | POA: Diagnosis present

## 2020-02-17 DIAGNOSIS — Z6832 Body mass index (BMI) 32.0-32.9, adult: Secondary | ICD-10-CM

## 2020-02-17 DIAGNOSIS — K7469 Other cirrhosis of liver: Secondary | ICD-10-CM | POA: Diagnosis present

## 2020-02-17 DIAGNOSIS — Z7952 Long term (current) use of systemic steroids: Secondary | ICD-10-CM

## 2020-02-17 DIAGNOSIS — Z8673 Personal history of transient ischemic attack (TIA), and cerebral infarction without residual deficits: Secondary | ICD-10-CM

## 2020-02-17 DIAGNOSIS — I4581 Long QT syndrome: Secondary | ICD-10-CM | POA: Diagnosis not present

## 2020-02-17 DIAGNOSIS — Z881 Allergy status to other antibiotic agents status: Secondary | ICD-10-CM

## 2020-02-17 DIAGNOSIS — E1122 Type 2 diabetes mellitus with diabetic chronic kidney disease: Secondary | ICD-10-CM | POA: Diagnosis present

## 2020-02-17 DIAGNOSIS — B965 Pseudomonas (aeruginosa) (mallei) (pseudomallei) as the cause of diseases classified elsewhere: Secondary | ICD-10-CM | POA: Diagnosis present

## 2020-02-17 DIAGNOSIS — D45 Polycythemia vera: Secondary | ICD-10-CM | POA: Diagnosis present

## 2020-02-17 DIAGNOSIS — K2950 Unspecified chronic gastritis without bleeding: Secondary | ICD-10-CM | POA: Diagnosis present

## 2020-02-17 DIAGNOSIS — Z86010 Personal history of colonic polyps: Secondary | ICD-10-CM

## 2020-02-17 DIAGNOSIS — Z888 Allergy status to other drugs, medicaments and biological substances status: Secondary | ICD-10-CM

## 2020-02-17 DIAGNOSIS — Z91040 Latex allergy status: Secondary | ICD-10-CM

## 2020-02-17 DIAGNOSIS — Z9079 Acquired absence of other genital organ(s): Secondary | ICD-10-CM

## 2020-02-17 DIAGNOSIS — Z79899 Other long term (current) drug therapy: Secondary | ICD-10-CM

## 2020-02-17 DIAGNOSIS — G4733 Obstructive sleep apnea (adult) (pediatric): Secondary | ICD-10-CM | POA: Diagnosis present

## 2020-02-17 DIAGNOSIS — Z8249 Family history of ischemic heart disease and other diseases of the circulatory system: Secondary | ICD-10-CM

## 2020-02-17 DIAGNOSIS — K2990 Gastroduodenitis, unspecified, without bleeding: Secondary | ICD-10-CM

## 2020-02-17 DIAGNOSIS — E785 Hyperlipidemia, unspecified: Secondary | ICD-10-CM | POA: Diagnosis present

## 2020-02-17 DIAGNOSIS — Z880 Allergy status to penicillin: Secondary | ICD-10-CM

## 2020-02-17 DIAGNOSIS — D62 Acute posthemorrhagic anemia: Secondary | ICD-10-CM | POA: Diagnosis present

## 2020-02-17 DIAGNOSIS — E876 Hypokalemia: Secondary | ICD-10-CM | POA: Diagnosis present

## 2020-02-17 DIAGNOSIS — Z9089 Acquired absence of other organs: Secondary | ICD-10-CM

## 2020-02-17 DIAGNOSIS — Z83438 Family history of other disorder of lipoprotein metabolism and other lipidemia: Secondary | ICD-10-CM

## 2020-02-17 DIAGNOSIS — Z841 Family history of disorders of kidney and ureter: Secondary | ICD-10-CM

## 2020-02-17 DIAGNOSIS — Z91048 Other nonmedicinal substance allergy status: Secondary | ICD-10-CM

## 2020-02-17 DIAGNOSIS — K7581 Nonalcoholic steatohepatitis (NASH): Secondary | ICD-10-CM | POA: Diagnosis present

## 2020-02-17 DIAGNOSIS — K31819 Angiodysplasia of stomach and duodenum without bleeding: Secondary | ICD-10-CM | POA: Diagnosis not present

## 2020-02-17 DIAGNOSIS — K2970 Gastritis, unspecified, without bleeding: Secondary | ICD-10-CM | POA: Diagnosis not present

## 2020-02-17 DIAGNOSIS — Z9841 Cataract extraction status, right eye: Secondary | ICD-10-CM

## 2020-02-17 DIAGNOSIS — Z803 Family history of malignant neoplasm of breast: Secondary | ICD-10-CM

## 2020-02-17 DIAGNOSIS — Z90710 Acquired absence of both cervix and uterus: Secondary | ICD-10-CM

## 2020-02-17 DIAGNOSIS — N179 Acute kidney failure, unspecified: Secondary | ICD-10-CM | POA: Diagnosis present

## 2020-02-17 DIAGNOSIS — Z7901 Long term (current) use of anticoagulants: Secondary | ICD-10-CM

## 2020-02-17 LAB — CBC WITH DIFFERENTIAL/PLATELET
Abs Immature Granulocytes: 1.52 10*3/uL — ABNORMAL HIGH (ref 0.00–0.07)
Abs Immature Granulocytes: 0.8 10*3/uL — ABNORMAL HIGH (ref 0.00–0.07)
Basophils Absolute: 0.2 10*3/uL — ABNORMAL HIGH (ref 0.0–0.1)
Basophils Absolute: 0.1 10*3/uL (ref 0.0–0.1)
Basophils Relative: 1 %
Basophils Relative: 1 %
Eosinophils Absolute: 0 10*3/uL (ref 0.0–0.5)
Eosinophils Absolute: 0 10*3/uL (ref 0.0–0.5)
Eosinophils Relative: 0 %
Eosinophils Relative: 0 %
HCT: 27.2 % — ABNORMAL LOW (ref 36.0–46.0)
HCT: 21.9 % — ABNORMAL LOW (ref 36.0–46.0)
Hemoglobin: 7.6 g/dL — ABNORMAL LOW (ref 12.0–15.0)
Hemoglobin: 6.1 g/dL — CL (ref 12.0–15.0)
Immature Granulocytes: 5 %
Immature Granulocytes: 4 %
Lymphocytes Relative: 10 %
Lymphocytes Relative: 15 %
Lymphs Abs: 3.6 10*3/uL (ref 0.7–4.0)
Lymphs Abs: 2.8 10*3/uL (ref 0.7–4.0)
MCH: 26.2 pg (ref 26.0–34.0)
MCH: 26.2 pg (ref 26.0–34.0)
MCHC: 27.9 g/dL — ABNORMAL LOW (ref 30.0–36.0)
MCHC: 27.9 g/dL — ABNORMAL LOW (ref 30.0–36.0)
MCV: 93.8 fL (ref 80.0–100.0)
MCV: 94 fL (ref 80.0–100.0)
Monocytes Absolute: 1.4 10*3/uL — ABNORMAL HIGH (ref 0.1–1.0)
Monocytes Absolute: 0.8 10*3/uL (ref 0.1–1.0)
Monocytes Relative: 4 %
Monocytes Relative: 4 %
Neutro Abs: 27.4 10*3/uL — ABNORMAL HIGH (ref 1.7–7.7)
Neutro Abs: 14.5 10*3/uL — ABNORMAL HIGH (ref 1.7–7.7)
Neutrophils Relative %: 80 %
Neutrophils Relative %: 76 %
Platelets: 402 10*3/uL — ABNORMAL HIGH (ref 150–400)
Platelets: 244 10*3/uL (ref 150–400)
RBC: 2.9 MIL/uL — ABNORMAL LOW (ref 3.87–5.11)
RBC: 2.33 MIL/uL — ABNORMAL LOW (ref 3.87–5.11)
RDW: 20.9 % — ABNORMAL HIGH (ref 11.5–15.5)
RDW: 20.7 % — ABNORMAL HIGH (ref 11.5–15.5)
WBC: 34.1 10*3/uL — ABNORMAL HIGH (ref 4.0–10.5)
WBC: 18.9 10*3/uL — ABNORMAL HIGH (ref 4.0–10.5)
nRBC: 0.5 % — ABNORMAL HIGH (ref 0.0–0.2)
nRBC: 0.3 % — ABNORMAL HIGH (ref 0.0–0.2)

## 2020-02-17 LAB — HEPATIC FUNCTION PANEL
ALT: 12 U/L (ref 0–44)
AST: 39 U/L (ref 15–41)
Albumin: 2.6 g/dL — ABNORMAL LOW (ref 3.5–5.0)
Alkaline Phosphatase: 101 U/L (ref 38–126)
Bilirubin, Direct: 0.5 mg/dL — ABNORMAL HIGH (ref 0.0–0.2)
Indirect Bilirubin: 1.1 mg/dL — ABNORMAL HIGH (ref 0.3–0.9)
Total Bilirubin: 1.6 mg/dL — ABNORMAL HIGH (ref 0.3–1.2)
Total Protein: 5.1 g/dL — ABNORMAL LOW (ref 6.5–8.1)

## 2020-02-17 LAB — AMMONIA: Ammonia: 28 umol/L (ref 9–35)

## 2020-02-17 LAB — BASIC METABOLIC PANEL
Anion gap: 14 (ref 5–15)
BUN: 19 mg/dL (ref 8–23)
CO2: 23 mmol/L (ref 22–32)
Calcium: 8.2 mg/dL — ABNORMAL LOW (ref 8.9–10.3)
Chloride: 99 mmol/L (ref 98–111)
Creatinine, Ser: 1.49 mg/dL — ABNORMAL HIGH (ref 0.44–1.00)
GFR calc Af Amer: 38 mL/min — ABNORMAL LOW (ref >60)
GFR calc non Af Amer: 33 mL/min — ABNORMAL LOW (ref >60)
Glucose, Bld: 139 mg/dL — ABNORMAL HIGH (ref 70–99)
Potassium: 2.9 mmol/L — ABNORMAL LOW (ref 3.5–5.1)
Sodium: 136 mmol/L (ref 135–145)

## 2020-02-17 LAB — TYPE AND SCREEN
ABO/RH(D): O POS
Antibody Screen: NEGATIVE

## 2020-02-17 LAB — PREPARE RBC (CROSSMATCH)

## 2020-02-17 LAB — RESPIRATORY PANEL BY RT PCR (FLU A&B, COVID)
Influenza A by PCR: NEGATIVE
Influenza B by PCR: NEGATIVE
SARS Coronavirus 2 by RT PCR: NEGATIVE

## 2020-02-17 LAB — POC OCCULT BLOOD, ED: Fecal Occult Bld: POSITIVE — AB

## 2020-02-17 MED ORDER — SODIUM CHLORIDE 0.9 % IV SOLN
10.0000 mL/h | Freq: Once | INTRAVENOUS | Status: AC
  Administered 2020-02-18: 10 mL/h via INTRAVENOUS

## 2020-02-17 MED ORDER — SODIUM CHLORIDE 0.9 % IV BOLUS
500.0000 mL | Freq: Once | INTRAVENOUS | Status: AC
  Administered 2020-02-17: 500 mL via INTRAVENOUS

## 2020-02-17 MED ORDER — SODIUM CHLORIDE 0.9% FLUSH
3.0000 mL | Freq: Once | INTRAVENOUS | Status: AC
  Administered 2020-02-17: 17:00:00 3 mL via INTRAVENOUS

## 2020-02-17 MED ORDER — SODIUM CHLORIDE 0.9% IV SOLUTION
Freq: Once | INTRAVENOUS | Status: AC
  Administered 2020-02-18: 10 mL/h via INTRAVENOUS

## 2020-02-17 MED ORDER — POTASSIUM CHLORIDE 10 MEQ/100ML IV SOLN
10.0000 meq | Freq: Once | INTRAVENOUS | Status: AC
  Administered 2020-02-17: 10 meq via INTRAVENOUS
  Filled 2020-02-17: qty 100

## 2020-02-17 MED ORDER — PANTOPRAZOLE SODIUM 40 MG IV SOLR
40.0000 mg | Freq: Once | INTRAVENOUS | Status: AC
  Administered 2020-02-17: 18:00:00 40 mg via INTRAVENOUS
  Filled 2020-02-17: qty 40

## 2020-02-17 NOTE — ED Triage Notes (Signed)
Pt BIB [facility] from home, where she lives with her husband, per EMS, pt recently d/c from a rehab facility. Husband states pt was seen in this ED yesterday, and received potassium for hypokalemia. Pt c/o generalized weakness today.

## 2020-02-17 NOTE — ED Provider Notes (Addendum)
[facility] EMERGENCY DEPARTMENT Provider Note   CSN: [id number] Arrival date & time: 02/17/20  1626     History Chief Complaint  Patient presents with  . Weakness    Amanda [name] is a 79 y.o. female.  The history is provided by the patient and a caregiver.  Weakness Severity:  Moderate Onset quality:  Gradual Timing:  Constant Progression:  Unchanged Chronicity:  Recurrent Context comment:  Hx of GI bleed recently, was a rehab facility and sent home friday after insurance ran out, family unable to take care of her as she is to weak to walk. Patient with black stools today.  Relieved by:  Nothing Worsened by:  Nothing Associated symptoms: difficulty walking and lethargy   Associated symptoms: no abdominal pain, no arthralgias, no chest pain, no cough, no dysuria, no fever, no seizures, no shortness of breath and no vomiting   Risk factors: coronary artery disease        Past Medical History:  Diagnosis Date  . Allergic rhinitis 01/23/2016  . C. difficile colitis   . CAD (coronary artery disease)    a. s/p STEMI in 01/2015 with 95% LCx stenosis and distal 80% LCx stenosis (DESx2 placed)  . Cancer ([place])    SKIN  . Candidiasis of skin 09/30/2014  . Cirrhosis ([place])   . Depression   . Diabetes mellitus 2008  . Gout   . Herpes simplex   . Hyperglycemia 05/31/2013  . Hyperlipidemia   . Hypertension   . Myocardial infarction ([place])   . Neuromuscular disorder ([place])    BELL PALSY  . Obesity   . Polycythemia    Dr. Elease Hashimoto- [facility]  . Psoriasis     Patient Active Problem List   Diagnosis Date Noted  . Acute GI bleeding 02/17/2020  . Weakness generalized   . AVM (arteriovenous malformation) of small bowel, acquired with hemorrhage   . Malnutrition of moderate degree 01/16/2020  . Goals of care, counseling/discussion   . Palliative care by specialist   . Nausea & vomiting 01/13/2020  . Failure to thrive in adult 01/13/2020  .  Myeloproliferative disorder ([place]) 01/13/2020  . Portal vein thrombosis   . SIRS (systemic inflammatory response syndrome) ([place]) 12/28/2019  . Worsening lower extremity weakness 12/14/2019  . Leg swelling 08/21/2019  . Hypotension due to hypovolemia 08/10/2019  . CKD (chronic kidney disease), stage III 08/10/2019  . Acute blood loss anemia 05/23/2018  . Dehydration 03/22/2018  . History of CVA (cerebrovascular accident) 03/22/2018  . Hip injury, left, subsequent encounter 11/08/2017  . Iron deficiency anemia 12/22/2016  . OSA (obstructive sleep apnea) 04/21/2016  . Osteopenia 04/07/2016  . Lung nodule, solitary 02/05/2016  . Aortic dilatation ([place]) 02/05/2016  . Dyspnea 01/28/2016  . Cirrhosis ([place]) 01/28/2016  . Allergic rhinitis 01/23/2016  . Hepatic cirrhosis ([place]) 10/28/2015  . Irritable bowel syndrome 08/12/2015  . Obesity (BMI 30-39.9) 04/30/2015  . Diabetic retinopathy of both eyes ([place]) 04/18/2015  . Former smoker 04/18/2015  . GAD (generalized anxiety disorder) 04/18/2015  . Status post primary angioplasty with coronary stent 04/18/2015  . Coronary artery disease involving native coronary artery 03/01/2015  . Chronic diastolic CHF (congestive heart failure) ([place]) 02/01/2015  . ST elevation myocardial infarction (STEMI) involving left circumflex coronary artery in recovery phase ([place]) 01/27/2015  . Idioventricular rhythm ([place])   . Diabetic peripheral neuropathy associated with type 2 diabetes mellitus ([place]) 01/23/2015  . Type 2 diabetes mellitus with diabetic polyneuropathy ([place]) 01/23/2015  .  Thrombocytosis ([place]) 07/04/2014  . Cholelithiasis 02/07/2014  . Chronic diarrhea 01/29/2014  . Leukocytosis 06/02/2013  . [name] ([place]) 05/31/2013  . Essential hypertension 05/31/2013  . History of Bell's palsy 05/26/2013  . DERMATITIS, ATOPIC 11/16/2010  . DIZZINESS 11/16/2010  . DIASTOLIC DYSFUNCTION 14/43/1540  . Hyperlipidemia 12/15/2009  . Essential hypertension,  benign 11/24/2009  . PSORIASIS 11/24/2009  . Depression 09/13/2009  . 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 ANKLE FRACTURE SURGERY    . BIOPSY  05/26/2018   Procedure: BIOPSY;  Surgeon: [name], MD;  Location: [facility] ENDOSCOPY;  Service: Endoscopy;;  . BREAST SURGERY Left    milk duct  . CATARACT EXTRACTION, BILATERAL    . COLONOSCOPY    . ESOPHAGOGASTRODUODENOSCOPY N/A 05/26/2018   Procedure: ESOPHAGOGASTRODUODENOSCOPY (EGD);  Surgeon: [name], MD;  Location: [facility] ENDOSCOPY;  Service: Endoscopy;  Laterality: N/A;  . ESOPHAGOGASTRODUODENOSCOPY (EGD) WITH PROPOFOL N/A 01/21/2020   Procedure: ESOPHAGOGASTRODUODENOSCOPY (EGD) WITH PROPOFOL;  Surgeon: Yetta Flock, MD;  Location: [facility];  Service: Gastroenterology;  Laterality: N/A;  . HEMOSTASIS CLIP PLACEMENT  01/21/2020   Procedure: HEMOSTASIS CLIP PLACEMENT;  Surgeon: Yetta Flock, MD;  Location: [facility] ENDOSCOPY;  Service: Gastroenterology;;  . HOT HEMOSTASIS N/A 01/21/2020   Procedure: HOT HEMOSTASIS (ARGON PLASMA COAGULATION/BICAP);  Surgeon: Yetta Flock, MD;  Location: [facility] ENDOSCOPY;  Service: Gastroenterology;  Laterality: N/A;  . IR IMAGING GUIDED PORT INSERTION  08/30/2019  . IR PARACENTESIS  05/25/2018  . LEFT HEART CATHETERIZATION WITH CORONARY ANGIOGRAM N/A 01/27/2015   Procedure: LEFT HEART CATHETERIZATION WITH CORONARY ANGIOGRAM;  Surgeon: [name], MD;  Location: [facility] CATH LAB;  Service: Cardiovascular;  Laterality: N/A;  . TONSILLECTOMY    . TOTAL ABDOMINAL HYSTERECTOMY W/ BILATERAL SALPINGOOPHORECTOMY     for heavy periods with appendectomy     OB History   No obstetric history on file.     Family History  Problem Relation Age of Onset  . Diabetes Mother   . Heart disease Mother        CAD  . Hyperlipidemia Mother   . Hypertension Mother   . Kidney disease Mother   . Kidney disease Father   . Breast  cancer Maternal Aunt   . Heart disease Maternal Grandmother   . Heart disease Other        maternal aunts and uncles  . Colon cancer Neg Hx   . Esophageal cancer Neg Hx     Social History   Tobacco Use  . Smoking status: Former Smoker    Years: 48.00    Types: Cigarettes    Quit date: 10/18/1986    Years since quitting: 33.3  . Smokeless tobacco: Never Used  Substance Use Topics  . Alcohol use: No    Alcohol/week: 0.0 standard drinks  . Drug use: No    Home Medications Prior to Admission medications   Medication Sig Start Date End Date Taking? Authorizing Provider  acetaminophen (TYLENOL) 325 MG tablet Take 2 tablets (650 mg total) by mouth every 6 (six) hours as needed for mild pain (or Fever >/= 101). 05/27/18   Georgette Shell, MD  apixaban (ELIQUIS) 5 MG TABS tablet Take 1 tablet (5 mg total) by mouth 2 (two) times daily. 01/10/20 04/09/20  Nafziger, Tommi Rumps, NP  Blood Glucose Monitoring Suppl (ACCU-CHEK GUIDE) w/Device KIT 1 kit by Does not apply route as directed. 01/25/18   [name], MD  dronabinol (MARINOL) 2.5 MG capsule Take 1 capsule (2.5 mg total) by mouth 2 (two) times daily before lunch and supper. 01/24/20   Lavina Hamman, MD  Dulaglutide (TRULICITY) 1.5 JJ/9.4RD SOPN Inject 1.5 pens into the skin every Friday.     [provider]  ezetimibe (ZETIA) 10 MG tablet Take 1 tablet (10 mg total) by mouth daily. 02/13/20   [name], MD  feeding supplement, ENSURE ENLIVE, (ENSURE ENLIVE) LIQD Take 237 mLs by mouth 2 (two) times daily between meals. Patient not taking: Reported on 02/16/2020 01/24/20   Lavina Hamman, MD  furosemide (LASIX) 40 MG tablet TAKE 1 TABLET(40 MG) BY MOUTH DAILY Patient taking differently: Take 40 mg by mouth daily.  10/16/19   [name], MD  glucose blood (ACCU-CHEK GUIDE) test strip Use to check blood sugars 3 times daily 01/25/18   [name], MD  insulin aspart (NOVOLOG) 100 UNIT/ML injection Inject 0-15 Units into  the skin 3 (three) times daily with meals. CBG 70 - 120: 0 units CBG 121 - 150: 2 units CBG 151 - 200: 3 units CBG 201 - 250: 5 units CBG 251 - 300: 8 units CBG 301 - 350: 11 units CBG 351 - 400: 15 units Patient taking differently: Inject 90 Units into the skin daily.  01/24/20   Lavina Hamman, MD  lactulose (CHRONULAC) 10 GM/15ML solution Take 30 mLs (20 g total) by mouth 2 (two) times daily. 01/24/20   Lavina Hamman, MD  latanoprost (XALATAN) 0.005 % ophthalmic solution Place 1 drop into both eyes at bedtime.  03/23/19   [provider]  lidocaine-prilocaine (EMLA) cream Apply 1 application topically as needed. Please apply EMLA over the Port-A-Cath site 1 hour prior to coming to our office. Patient taking differently: Apply 1 application topically daily as needed (for port).  08/17/19   Volanda Napoleon, MD  mirtazapine (REMERON) 15 MG tablet Take 1 tablet (15 mg total) by mouth at bedtime. 08/17/19   Nafziger, Tommi Rumps, NP  ONETOUCH VERIO test strip USE TO TEST BLOOD SUGAR THREE TIMES DAILY 05/01/18   [name], MD  pantoprazole (PROTONIX) 40 MG tablet Take 1 tablet (40 mg total) by mouth 2 (two) times daily before a meal. 01/24/20   Lavina Hamman, MD  potassium chloride SA (KLOR-CON) 20 MEQ tablet One po bid x 5 days, then one po once a day 02/16/20   Lajean Saver, MD  spironolactone (ALDACTONE) 25 MG tablet Take 0.5 tablets (12.5 mg total) by mouth daily. 01/25/20   Lavina Hamman, MD  timolol (TIMOPTIC) 0.5 % ophthalmic solution Place 1 drop into both eyes daily.  10/07/19   [provider]  triamcinolone cream (KENALOG) 0.1 % Apply 1 application topically 2 (two) times daily. Patient taking differently: Apply 1 application topically 2 (two) times daily as needed (for rask skin).  12/11/19   Nafziger, Tommi Rumps, NP    Allergies    Doxycycline, Penicillins, Lisinopril, Tape, and Latex  Review of Systems   Review of Systems  Constitutional: Positive for fatigue. Negative  for chills and fever.  HENT: Negative for ear pain and sore throat.   Eyes: Negative for pain and visual disturbance.  Respiratory: Negative for cough and shortness of breath.   Cardiovascular: Negative for chest pain and palpitations.  Gastrointestinal: Positive for blood in stool (black stools). Negative for abdominal pain and vomiting.  Genitourinary: Negative for dysuria and hematuria.  Musculoskeletal: Positive for gait problem. Negative for  arthralgias and back pain.  Skin: Negative for color change and rash.  Neurological: Positive for weakness. Negative for seizures and syncope.  All other systems reviewed and are negative.   Physical Exam Updated Vital Signs BP (!) 94/35   Pulse 71   Temp 98 F (36.7 C) (Oral)   Resp 16   Ht 5' 4"  (1.626 m)   Wt 86.2 kg   SpO2 92%   BMI 32.61 kg/m   Physical Exam Vitals and nursing note reviewed.  Constitutional:      General: She is not in acute distress.    Appearance: She is well-developed. She is ill-appearing (chronically).  HENT:     Head: Normocephalic and atraumatic.     Nose: Nose normal.     Mouth/Throat:     Mouth: Mucous membranes are moist.  Eyes:     Extraocular Movements: Extraocular movements intact.     Conjunctiva/sclera: Conjunctivae normal.     Pupils: Pupils are equal, round, and reactive to light.  Cardiovascular:     Rate and Rhythm: Normal rate and regular rhythm.     Pulses: Normal pulses.     Heart sounds: Normal heart sounds. No murmur.  Pulmonary:     Effort: Pulmonary effort is normal. No respiratory distress.     Breath sounds: Normal breath sounds.  Abdominal:     Palpations: Abdomen is soft.     Tenderness: There is no abdominal tenderness.  Genitourinary:    Rectum: Guaiac result positive (melena ).  Musculoskeletal:     Cervical back: Normal range of motion and neck supple.  Skin:    General: Skin is warm and dry.  Neurological:     General: No focal deficit present.     Mental  Status: She is alert and oriented to person, place, and time.     Cranial Nerves: No cranial nerve deficit.     Sensory: No sensory deficit.     Motor: No weakness.     Coordination: Coordination normal.  Psychiatric:        Mood and Affect: Mood normal.     ED Results / Procedures / Treatments   Labs (all labs ordered are listed, but only abnormal results are displayed) Labs Reviewed  BASIC METABOLIC PANEL - Abnormal; Notable for the following components:      Result Value   Potassium 2.9 (*)    Glucose, Bld 139 (*)    Creatinine, Ser 1.49 (*)    Calcium 8.2 (*)    GFR calc non Af Amer 33 (*)    GFR calc Af Amer 38 (*)    All other components within normal limits  HEPATIC FUNCTION PANEL - Abnormal; Notable for the following components:   Total Protein 5.1 (*)    Albumin 2.6 (*)    Total Bilirubin 1.6 (*)    Bilirubin, Direct 0.5 (*)    Indirect Bilirubin 1.1 (*)    All other components within normal limits  CBC WITH DIFFERENTIAL/PLATELET - Abnormal; Notable for the following components:   WBC 34.1 (*)    RBC 2.90 (*)    Hemoglobin 7.6 (*)    HCT 27.2 (*)    MCHC 27.9 (*)    RDW 20.9 (*)    Platelets 402 (*)    nRBC 0.5 (*)    Neutro Abs 27.4 (*)    Monocytes Absolute 1.4 (*)    Basophils Absolute 0.2 (*)    Abs Immature Granulocytes 1.52 (*)    All  other components within normal limits  CBC WITH DIFFERENTIAL/PLATELET - Abnormal; Notable for the following components:   WBC 18.9 (*)    RBC 2.33 (*)    Hemoglobin 6.1 (*)    HCT 21.9 (*)    MCHC 27.9 (*)    RDW 20.7 (*)    nRBC 0.3 (*)    Neutro Abs 14.5 (*)    Abs Immature Granulocytes 0.80 (*)    All other components within normal limits  POC OCCULT BLOOD, ED - Abnormal; Notable for the following components:   Fecal Occult Bld POSITIVE (*)    All other components within normal limits  RESPIRATORY PANEL BY RT PCR (FLU A&B, COVID)  URINE CULTURE  AMMONIA  URINALYSIS, ROUTINE W REFLEX MICROSCOPIC  TYPE AND  SCREEN  PREPARE RBC (CROSSMATCH)  PREPARE RBC (CROSSMATCH)    EKG EKG Interpretation  Date/Time:  Sunday Feb 17 2020 16:35:04 EDT Ventricular Rate:  99 PR Interval:  136 QRS Duration: 92 QT Interval:  394 QTC Calculation: 505 R Axis:   -32 Text Interpretation: Sinus rhythm with occasional Premature ventricular complexes Left axis deviation Cannot rule out Anterior infarct , age undetermined Abnormal ECG Confirmed by Lennice Sites [phone]) on 02/17/2020 5:05:04 PM   Radiology DG Chest Portable 1 View  Result Date: 02/17/2020 CLINICAL DATA:  weakness EXAM: PORTABLE CHEST 1 VIEW COMPARISON:  Chest radiograph 01/13/2020 FINDINGS: Stable cardiomediastinal contours with enlarged heart size. Patient's right chest port is unchanged in position. Low lung volumes. There are mild opacities at the left lung base, similar to prior, likely atelectasis. No new focal consolidation. No pneumothorax or large pleural effusion. No acute finding in the visualized skeleton. IMPRESSION: Low lung volumes with probable left basilar atelectasis, similar to prior. Electronically Signed   By: Audie Pinto M.D.   On: 02/17/2020 17:44    Procedures .Critical Care Performed by: Lennice Sites, DO Authorized by: Lennice Sites, DO   Critical care provider statement:    Critical care time (minutes):  40   Critical care was necessary to treat or prevent imminent or life-threatening deterioration of the following conditions:  Circulatory failure   Critical care was time spent personally by me on the following activities:  Blood draw for specimens, development of treatment plan with patient or surrogate, discussions with consultants, discussions with primary provider, obtaining history from patient or surrogate, ordering and performing treatments and interventions, ordering and review of laboratory studies, ordering and review of radiographic studies, pulse oximetry and re-evaluation of patient's condition   I  assumed direction of critical care for this patient from another provider in my specialty: no     (including critical care time)  Medications Ordered in ED Medications  0.9 %  sodium chloride infusion (has no administration in time range)  0.9 %  sodium chloride infusion (Manually program via Guardrails IV Fluids) (has no administration in time range)  sodium chloride flush (NS) 0.9 % injection 3 mL (3 mLs Intravenous Given 02/17/20 1715)  pantoprazole (PROTONIX) injection 40 mg (40 mg Intravenous Given 02/17/20 1822)  potassium chloride 10 mEq in 100 mL IVPB (0 mEq Intravenous Stopped 02/17/20 2220)  sodium chloride 0.9 % bolus 500 mL (0 mLs Intravenous Stopped 02/17/20 2220)    ED Course  I have reviewed the triage vital signs and the nursing notes.  Pertinent labs & imaging results that were available during my care of the patient were reviewed by me and considered in my medical decision making (see chart for details).  MDM Rules/Calculators/A&P                      NATAYAH WARMACK is an 78 year old female with history of GI bleed, CAD, CHF who presents to the ED with weakness.  Patient with unremarkable vitals.  No fever.  Patient with recent GI bleed.  Prolonged hospital stay and rehab stay.  Was seen yesterday for generalized weakness and unremarkable work-up and discharge.  Patient states that they are unable to take care of her at home.  However, they were concerned because she had black stool again today.  She has gross melena on exam.  No pain.  Normal vitals.  Patient is on blood thinner.  Concern for GI bleed.  She has had angio ectasias in the duodenum in the past.  Recently a couple weeks ago.  Patient to be evaluated with lab work.  Will give IV Protonix.  Patient with mild AKI with creatinine of 1.49.  Potassium 2.9.  Will replete with 500 cc normal saline bolus.  Will give IV potassium.  Hemoglobin however stable at 7.6.  She has persistent leukocytosis.  No infectious findings.  No  fever.  Overall suspect GI bleed.  Talked with Dr. Lyndel Safe with [facility] and will evaluate the patient in the morning.  Patient admitted to hospitalist for further care.  Repeat CBC was obtained while under my care.  Hemoglobin 6.1.  Patient given a unit of packed red blood cells.  Blood pressure currently 90 systolic.  No tachycardia.  Primary team made aware.  This chart was dictated using voice recognition software.  Despite best efforts to proofread,  errors can occur which can change the documentation meaning.     Final Clinical Impression(s) / ED Diagnoses Final diagnoses:  AKI (acute kidney injury) [facility])  Gastrointestinal hemorrhage, unspecified gastrointestinal hemorrhage type    Rx / DC Orders ED Discharge Orders    None       Lennice Sites, DO 02/17/20 2214    Lennice Sites, DO 02/17/20 2334

## 2020-02-17 NOTE — ED Notes (Signed)
Daughter to SORT nurse and wants to add that pt has increased confusion, unable to get out of bed, and black stools.

## 2020-02-17 NOTE — ED Notes (Signed)
Pt's daughter reports that patient has been falling more lately, experiencing increased confusion, black tarry stools, unregulated CBGs, husband unable to take care of wife. Daughter states pt fell 1 mo ago, leg fx, d/c to rehab.

## 2020-02-17 NOTE — Care Management (Signed)
ED Cm spoke with patient's daughter Yeila Morro [phone] concerning possibly [facility] care.will provide information for [facility].TOC will continue to f/u for safe discharge plan

## 2020-02-17 NOTE — ED Notes (Signed)
MD made aware of increasing hypotension.

## 2020-02-17 NOTE — ED Notes (Signed)
Date and time results received: 02/17/20  (use smartphrase ".now" to insert current time)  Test: HG Critical Value: 6.1 Name of Provider Notified:[place]

## 2020-02-18 ENCOUNTER — Encounter (HOSPITAL_COMMUNITY): Payer: Self-pay | Admitting: Internal Medicine

## 2020-02-18 DIAGNOSIS — N179 Acute kidney failure, unspecified: Secondary | ICD-10-CM | POA: Diagnosis not present

## 2020-02-18 DIAGNOSIS — I5032 Chronic diastolic (congestive) heart failure: Secondary | ICD-10-CM | POA: Diagnosis not present

## 2020-02-18 DIAGNOSIS — K922 Gastrointestinal hemorrhage, unspecified: Secondary | ICD-10-CM | POA: Diagnosis not present

## 2020-02-18 DIAGNOSIS — I81 Portal vein thrombosis: Secondary | ICD-10-CM | POA: Diagnosis not present

## 2020-02-18 DIAGNOSIS — K7460 Unspecified cirrhosis of liver: Secondary | ICD-10-CM | POA: Diagnosis not present

## 2020-02-18 LAB — URINALYSIS, ROUTINE W REFLEX MICROSCOPIC
Bilirubin Urine: NEGATIVE
Glucose, UA: NEGATIVE mg/dL
Hgb urine dipstick: NEGATIVE
Ketones, ur: NEGATIVE mg/dL
Nitrite: POSITIVE — AB
Protein, ur: NEGATIVE mg/dL
Specific Gravity, Urine: 1.009 (ref 1.005–1.030)
WBC, UA: >50 WBC/hpf — ABNORMAL HIGH (ref 0–5)
pH: 6 (ref 5.0–8.0)

## 2020-02-18 LAB — GLUCOSE, CAPILLARY
Glucose-Capillary: 114 mg/dL — ABNORMAL HIGH (ref 70–99)
Glucose-Capillary: 97 mg/dL (ref 70–99)
Glucose-Capillary: 101 mg/dL — ABNORMAL HIGH (ref 70–99)

## 2020-02-18 LAB — CBG MONITORING, ED
Glucose-Capillary: 102 mg/dL — ABNORMAL HIGH (ref 70–99)
Glucose-Capillary: 130 mg/dL — ABNORMAL HIGH (ref 70–99)
Glucose-Capillary: 100 mg/dL — ABNORMAL HIGH (ref 70–99)
Glucose-Capillary: 98 mg/dL (ref 70–99)

## 2020-02-18 LAB — HEMOGLOBIN AND HEMATOCRIT, BLOOD
HCT: 29.7 % — ABNORMAL LOW (ref 36.0–46.0)
HCT: 39.6 % (ref 36.0–46.0)
Hemoglobin: 8.8 g/dL — ABNORMAL LOW (ref 12.0–15.0)
Hemoglobin: 12.7 g/dL (ref 12.0–15.0)

## 2020-02-18 MED ORDER — PANTOPRAZOLE SODIUM 40 MG PO TBEC
40.0000 mg | DELAYED_RELEASE_TABLET | Freq: Two times a day (BID) | ORAL | Status: DC
  Administered 2020-02-18 – 2020-02-21 (×5): 40 mg via ORAL
  Filled 2020-02-18 (×5): qty 1

## 2020-02-18 MED ORDER — ONDANSETRON HCL 4 MG PO TABS: 4.0000 mg | ORAL_TABLET | Freq: Four times a day (QID) | ORAL | Status: DC | PRN

## 2020-02-18 MED ORDER — SODIUM CHLORIDE 0.9 % IV SOLN
1.0000 g | INTRAVENOUS | Status: DC
  Administered 2020-02-18: 1 g via INTRAVENOUS
  Filled 2020-02-18: qty 10
  Filled 2020-02-18: qty 1

## 2020-02-18 MED ORDER — CIPROFLOXACIN IN D5W 400 MG/200ML IV SOLN: 400.0000 mg | Freq: Two times a day (BID) | INTRAVENOUS | Status: DC

## 2020-02-18 MED ORDER — INSULIN ASPART 100 UNIT/ML ~~LOC~~ SOLN: 0.0000 [IU] | SUBCUTANEOUS | Status: DC

## 2020-02-18 MED ORDER — PANTOPRAZOLE SODIUM 40 MG IV SOLR
40.0000 mg | Freq: Two times a day (BID) | INTRAVENOUS | Status: DC
  Filled 2020-02-18: qty 40

## 2020-02-18 MED ORDER — LATANOPROST 0.005 % OP SOLN
1.0000 [drp] | Freq: Every day | OPHTHALMIC | Status: DC
  Administered 2020-02-18 – 2020-02-20 (×3): 1 [drp] via OPHTHALMIC
  Filled 2020-02-18: qty 2.5

## 2020-02-18 MED ORDER — ONDANSETRON HCL 4 MG/2ML IJ SOLN: 4.0000 mg | Freq: Four times a day (QID) | INTRAMUSCULAR | Status: DC | PRN

## 2020-02-18 MED ORDER — CIPROFLOXACIN IN D5W 400 MG/200ML IV SOLN
400.0000 mg | Freq: Once | INTRAVENOUS | Status: AC
  Administered 2020-02-18: 400 mg via INTRAVENOUS
  Filled 2020-02-18: qty 200

## 2020-02-18 MED ORDER — PANTOPRAZOLE SODIUM 40 MG IV SOLR: 40.0000 mg | Freq: Two times a day (BID) | INTRAVENOUS | Status: DC

## 2020-02-18 MED ORDER — TIMOLOL MALEATE 0.5 % OP SOLN
1.0000 [drp] | Freq: Every day | OPHTHALMIC | Status: DC
  Administered 2020-02-18 – 2020-02-21 (×4): 1 [drp] via OPHTHALMIC
  Filled 2020-02-18: qty 5

## 2020-02-18 MED ORDER — FUROSEMIDE 10 MG/ML IJ SOLN
20.0000 mg | Freq: Once | INTRAMUSCULAR | Status: AC
  Administered 2020-02-18: 20 mg via INTRAVENOUS
  Filled 2020-02-18: qty 2

## 2020-02-18 NOTE — Progress Notes (Signed)
PROGRESS NOTE    Patient: Amanda [name]                            PCP: [name], NP                    DOB: 11/02/1940            DOA: 02/17/2020 HBZ:[id number]             DOS: 02/18/2020, 12:47 PM   LOS: 1 day   Date of Service: The patient was seen and examined on 02/18/2020  Subjective:   The patient was seen and examined this this this morning.  Hemodynamically stable.   Receiving total of 3 units of PRBC transfusion in ED.  Husband present at bedside reporting is the second time that she has had this episode.  Brief Narrative:    Amanda [name] is a 79 y.o. 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   Once again presented from rehab with chief complaint of be increasingly weak and has started having black stools again.  Patient's hemoglobin on arrival was 6.1. GI Dr. Lyndel Safe was notified. Patient was admitted and initiated 3 units of PRBC transfusion. Also had a leukocytosis with WBC of 18.9, with UA consistent with UTI.  Potassium was 2.9.  Assessment & Plan:   Principal Problem:   Acute GI bleeding Active Problems:   [name] (HCC)   Essential hypertension   Chronic diastolic CHF (congestive heart failure) (HCC)   Coronary artery disease involving native coronary artery   Hepatic cirrhosis (HCC)   Type 2 diabetes mellitus with diabetic polyneuropathy (HCC)   AKI (acute kidney injury) ([place])   History of CVA (cerebrovascular accident)   CKD (chronic kidney disease), stage III   Portal vein thrombosis   AVM (arteriovenous malformation) of small bowel, acquired with hemorrhage   Acute GI bleed  -Likely upper GI status post recent EGD showing  showing angiodysplasia  requiring clipping's and alcohol plasma coagulation -the patient has been ordered to receive 3 units of PRBC transfusion today -We will follow with H&H every 6 hours -Patient has been kept n.p.o. -Patient was initially started on antibiotics GI recommending no antibiotics as there is no varices or portal gastritis  Dr. Lyndel Safe of [facility] has been consulted.   - Anticipation of procedure.   -We will continue Protonix IV.  -Eliquis on hold  Acute blood loss anemia secondary to GI bleed follow CBC after transfusion.  History of cirrhosis -status post recent EGD negative for varices or portal gastritis-GI recommending no SBP prophylaxis   History of myeloproliferative disorder presently n.p.o.  History of CAD presently n.p.o. denies any chest pain.  Diabetes mellitus type 2 we will keep patient sliding scale coverage.   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  Acute on chronic kidney disease stage III creatinine mildly increased from baseline likely from GI bleed repeat metabolic panel after transfusion.  Hypokalemia could be from poor oral intake.  Replace recheck check magnesium with labs   UTI-we will follow with urine culture, continue abx Cipro at this time.     Nutritional status:  Cultures; Urine Culture  >>>    Antimicrobials: 02/18/2020 ciprofloxacin 500 mg twice daily   Consultants: Gastroenterologist Dr. Lyndel Safe   -----------------------------------------------------------------------------------------------------------------  DVT prophylaxis: SCD/Compression stockings Code Status:   Code Status: DNR Family Communication: Husband present at bedside-updated The above findings and plan of care has been discussed with patient (and her husband)  in detail,  they expressed understanding and agreement of above. -Advance care planning has been discussed.   Admission status:   Status is:  Inpatient  Remains inpatient appropriate because:Hemodynamically unstable, Persistent severe electrolyte disturbances, Ongoing diagnostic testing needed not appropriate for outpatient work up and Inpatient level of care appropriate due to severity of illness   Dispo: The patient is from: SNF              Anticipated d/c is to: SNF              Anticipated d/c date is: 2 days              Patient currently is not medically stable to d/c.  Due to active GI bleed,  symptomatic anemia           Procedures:   No admission procedures for hospital encounter.     Antimicrobials:  Anti-infectives (From admission, onward)   Start     Dose/Rate Route Frequency Ordered Stop   02/18/20 1200  ciprofloxacin (CIPRO) IVPB 400 mg     400 mg 200 mL/hr over 60 Minutes Intravenous Every 12 hours 02/18/20 0058     02/18/20 0100  ciprofloxacin (CIPRO) IVPB 400 mg     400 mg 200 mL/hr over 60 Minutes Intravenous  Once 02/18/20 0058 02/18/20 0414       Medication:  . furosemide  20 mg Intravenous Once  . insulin aspart  0-6 Units Subcutaneous Q4H  . latanoprost  1 drop Both Eyes QHS  . pantoprazole  40 mg Oral BID  . timolol  1 drop Both Eyes Daily    ondansetron **OR** ondansetron (ZOFRAN) IV   Objective:   Vitals:   02/18/20 1059 02/18/20 1100 02/18/20 1200 02/18/20 1239  BP: 138/61 138/61 101/88 (!) 101/51  Pulse: 79 79 66 72  Resp: 18 18 18 20   Temp: 97.8 F (36.6 C)   97.7 F (36.5 C)  TempSrc: Oral   Oral  SpO2: 94% 94% 94% 94%  Weight:      Height:        Intake/Output Summary (Last 24 hours) at 02/18/2020 1247 Last data filed at 02/18/2020 1239 Gross per 24 hour  Intake 2201.84 ml  Output --  Net 2201.84 ml   Filed Weights   02/17/20 1635  Weight: 86.2 kg     Examination:   Physical Exam  Constitution:  Alert, cooperative, no distress,  Appears calm and comfortable  Psychiatric: Normal and stable mood and affect, cognition intact,   HEENT: Normocephalic,  PERRL, otherwise with in Normal limits  Chest:Chest symmetric Cardio vascular:  S1/S2, RRR, No murmure, No Rubs or Gallops  pulmonary: Clear to auscultation bilaterally, respirations unlabored, negative wheezes / crackles Abdomen: Soft, non-tender, non-distended, bowel sounds,no masses, no organomegaly Muscular skeletal: Limited exam - in bed, able to move all 4 extremities, Normal strength,  Neuro: CNII-XII intact. , normal motor and sensation, reflexes intact  Extremities: No pitting edema lower extremities, +2 pulses  Skin: Dry, warm to touch, negative for any Rashes, No open wounds Wounds: per nursing documentation    ------------------------------------------------------------------------------------------------------------------------------------------    LABs:  CBC  Latest Ref Rng & Units 02/18/2020 02/17/2020 02/17/2020  WBC 4.0 - 10.5 K/uL - 18.9(H) 34.1(H)  Hemoglobin 12.0 - 15.0 g/dL 8.8(L) 6.1(LL) 7.6(L)  Hematocrit 36.0 - 46.0 % 29.7(L) 21.9(L) 27.2(L)  Platelets 150 - 400 K/uL - 244 402(H)   CMP Latest Ref Rng & Units 02/17/2020 02/16/2020 01/24/2020  Glucose 70 - 99 mg/dL 139(H) 131(H) 136(H)  BUN 8 - 23 mg/dL 19 19 12   Creatinine 0.44 - 1.00 mg/dL 1.49(H) 1.34(H) 1.12(H)  Sodium 135 - 145 mmol/L 136 139 138  Potassium 3.5 - 5.1 mmol/L 2.9(L) 2.6(LL) 3.8  Chloride 98 - 111 mmol/L 99 101 103  CO2 22 - 32 mmol/L 23 22 26   Calcium 8.9 - 10.3 mg/dL 8.2(L) 8.3(L) 8.3(L)  Total Protein 6.5 - 8.1 g/dL 5.1(L) 5.1(L) 4.6(L)  Total Bilirubin 0.3 - 1.2 mg/dL 1.6(H) 2.0(H) 1.3(H)  Alkaline Phos 38 - 126 U/L 101 101 88  AST 15 - 41 U/L 39 36 33  ALT 0 - 44 U/L 12 11 13        Micro Results Recent Results (from the past 240 hour(s))  Respiratory Panel by RT PCR (Flu A&B, Covid) - Nasopharyngeal Swab     Status: None   Collection Time: 02/17/20  9:41 PM   Specimen: Nasopharyngeal Swab  Result Value Ref Range Status   SARS Coronavirus 2 by RT PCR NEGATIVE NEGATIVE Final    Comment:  (NOTE) SARS-CoV-2 target nucleic acids are NOT DETECTED. The SARS-CoV-2 RNA is generally detectable in upper respiratoy specimens during the acute phase of infection. The lowest concentration of SARS-CoV-2 viral copies this assay can detect is 131 copies/mL. A negative result does not preclude SARS-Cov-2 infection and should not be used as the sole basis for treatment or other patient management decisions. A negative result may occur with  improper specimen collection/handling, submission of specimen other than nasopharyngeal swab, presence of viral mutation(s) within the areas targeted by this assay, and inadequate number of viral copies (<131 copies/mL). A negative result must be combined with clinical observations, patient history, and epidemiological information. The expected result is Negative. Fact Sheet for Patients:  PinkCheek.be Fact Sheet for Healthcare Providers:  GravelBags.it This test is not yet ap proved or cleared by the [place] FDA and  has been authorized for detection and/or diagnosis of SARS-CoV-2 by FDA under an Emergency Use Authorization (EUA). This EUA will remain  in effect (meaning this test can be used) for the duration of the COVID-19 declaration under Section 564(b)(1) of the Act, 21 U.S.C. section 360bbb-3(b)(1), unless the authorization is terminated or revoked sooner.    Influenza A by PCR NEGATIVE NEGATIVE Final   Influenza B by PCR NEGATIVE NEGATIVE Final    Comment: (NOTE) The Xpert Xpress SARS-CoV-2/FLU/RSV assay is intended as an aid in  the diagnosis of influenza from Nasopharyngeal swab specimens and  should not be used as a sole basis for treatment. Nasal washings and  aspirates are unacceptable for Xpert Xpress SARS-CoV-2/FLU/RSV  testing. Fact Sheet for Patients: PinkCheek.be Fact Sheet for Healthcare  Providers: GravelBags.it This test is not yet approved or cleared by the [place] FDA and  has been authorized for detection and/or diagnosis of SARS-CoV-2 by  FDA under an Emergency Use Authorization (EUA). This EUA will remain  in effect (meaning this test can be used) for the duration of the  Covid-19 declaration under Section 564(b)(1) of the Act, 21  U.S.C. section 360bbb-3(b)(1), unless the authorization is  terminated or revoked. Performed at  [facility], [place] [address] [place], [place] [address]     Radiology Reports DG Chest Portable 1 View  Result Date: 02/17/2020 CLINICAL DATA:  weakness EXAM: PORTABLE CHEST 1 VIEW COMPARISON:  Chest radiograph 01/13/2020 FINDINGS: Stable cardiomediastinal contours with enlarged heart size. Patient's right chest port is unchanged in position. Low lung volumes. There are mild opacities at the left lung base, similar to prior, likely atelectasis. No new focal consolidation. No pneumothorax or large pleural effusion. No acute finding in the visualized skeleton. IMPRESSION: Low lung volumes with probable left basilar atelectasis, similar to prior. Electronically Signed   By: Audie Pinto M.D.   On: 02/17/2020 17:44    SIGNED: Deatra James, MD, FACP, FHM. [facility],  Pager (please use [email or web address] to page/text)  If 7PM-7AM, please contact night-coverage [email or web address] 02/18/2020, 12:47 PM

## 2020-02-18 NOTE — H&P (View-Only) (Signed)
[facility] Consult: 8:50 AM 02/18/2020  LOS: 1 day    Referring Provider: Dr Leeanne Mannan  Primary Care Physician:  [name], NP Primary Gastroenterologist:  Dr. [name].     Reason for Consultation:  Anemia, recurrent acute on chronic.  FOBT+   HPI: Amanda [name] is a 79 y.o. female.  Multiple comorbidities.  Hx polycythemia vera.  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   NASH cirrhosis.  Splenomegaly.  C. difficile.  Chronic diarrhea.  Morbid obesity. OSA.  Chronic Eliquis (?for cardiac stents 2016?)  Hematologic polycythemia vera NASH cirrhosis.  Splenomegaly.  Polycythemia on Hydrea, followed by hematology.  C. difficile.  Chronic diarrhea.  Morbid obesity.  OSA.  2012 colonoscopy.  Multiple small sessile polyps (TAs and inflammatory).  Scattered melanosis. 03/2014 EGD: Nonspecific pangastritis.  Small HH.  Slightly irregular Z-line.  No varices or portal gastropathy gastric path: Reactive changes with vascular hyperemia.  No H. pylori dyspnea plasia etc.  GE junction biopsy focal erosion. 05/2018 EGD: Gastritis, with diffuse moderate inflammation/erythema; path: Chronic inactive gastritis, no H. pylori or dysplasia etc. Scattered changes in the duodenum characterized by a few white specks, path: benign small bowel mucosa, no inflammation or villous atrophy.    Hospitalized 3/12 - 01/02/20  with "pancolitis" with N/V and SIRS, hypoxic resp failure and pna.   Per Dr Carlean Purl she may not have actually had colitis, findings may have been due to ascites /hypoalbuminemia but she was treated with antibiotics.  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   Readmitted 3/28 - 01/24/20 w anorexia, FTT, persistent N/V.  Recent black stools.  FTT.  01/21/2020 EGD.  Dark, FOBT + stools and anemia in  setting Eliquis.    Gastric mucosal atrophy with congestion, likely portal hypertension.  No ulcers.  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 D/c to SNF but recently returned home.    Was in emergency department 5/1, within 24 to 48 hours of discharge from SNF, where she received potassium for hypokalemia.  Hgb 7.6, within her baseline, pt requested discharge home and declined transfusion. Returned to the ED 5/2 with weakness.  Initial Hb 7.6 on recheck it was 6.1.  8.8 after 2 PRBCs. MCV as high as 104, currently 94. FOBT +.   WBCs 34.1 >> 18.9, not unusual for this patient. INR 3.4, was 1.5 four days prior.   BUN is not elevated.  Patient not aware of any rectal bleeding or melenic stool but she is not very observant.  She says that when she left SNF she was walking with a walker for limited distances within the house.  She bruises easily but denies any excessive bleeding.  Denies abdominal pain.  Says she has not had any recent nausea or vomiting.  Appetite remains depressed.  Moves her bowels anywhere from 1-5 times daily.      Past Medical History:  Diagnosis Date  . Allergic rhinitis 01/23/2016  . C. difficile colitis   . CAD (coronary artery disease)  a. s/p STEMI in 01/2015 with 95% LCx stenosis and distal 80% LCx stenosis (DESx2 placed)  . Cancer ([place])    SKIN  . Candidiasis of skin 09/30/2014  . Cirrhosis ([place])   . Depression   . Diabetes mellitus 2008  . Gout   . Herpes simplex   . Hyperglycemia 05/31/2013  . Hyperlipidemia   . Hypertension   . Myocardial infarction ([place])   . 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 Obesity   . Polycythemia    Dr. Elease Hashimoto- [facility]  . Psoriasis     Past Surgical History:  Procedure Laterality Date  . ANKLE FRACTURE SURGERY    . BIOPSY  05/26/2018   Procedure: BIOPSY;  Surgeon: [name], MD;  Location: [facility] ENDOSCOPY;  Service: Endoscopy;;  . BREAST SURGERY Left    milk duct   . CATARACT EXTRACTION, BILATERAL    . COLONOSCOPY    . ESOPHAGOGASTRODUODENOSCOPY N/A 05/26/2018   Procedure: ESOPHAGOGASTRODUODENOSCOPY (EGD);  Surgeon: [name], MD;  Location: [facility] ENDOSCOPY;  Service: Endoscopy;  Laterality: N/A;  . ESOPHAGOGASTRODUODENOSCOPY (EGD) WITH PROPOFOL N/A 01/21/2020   Procedure: ESOPHAGOGASTRODUODENOSCOPY (EGD) WITH PROPOFOL;  Surgeon: Yetta Flock, MD;  Location: [facility];  Service: Gastroenterology;  Laterality: N/A;  . HEMOSTASIS CLIP PLACEMENT  01/21/2020   Procedure: HEMOSTASIS CLIP PLACEMENT;  Surgeon: Yetta Flock, MD;  Location: [place] ENDOSCOPY;  Service: Gastroenterology;;  . HOT HEMOSTASIS N/A 01/21/2020   Procedure: HOT HEMOSTASIS (ARGON PLASMA COAGULATION/BICAP);  Surgeon: Yetta Flock, MD;  Location: [facility] ENDOSCOPY;  Service: Gastroenterology;  Laterality: N/A;  . IR IMAGING GUIDED PORT INSERTION  08/30/2019  . IR PARACENTESIS  05/25/2018  . LEFT HEART CATHETERIZATION WITH CORONARY ANGIOGRAM N/A 01/27/2015   Procedure: LEFT HEART CATHETERIZATION WITH CORONARY ANGIOGRAM;  Surgeon: [name], MD;  Location[facility] LAB;  Service: Cardiovascular;  Laterality: N/A;  . TONSILLECTOMY    . TOTAL ABDOMINAL HYSTERECTOMY W/ BILATERAL SALPINGOOPHORECTOMY     for heavy periods with appendectomy    Prior to Admission medications   Medication Sig Start Date End Date Taking? Authorizing Provider  acetaminophen (TYLENOL) 325 MG tablet Take 2 tablets (650 mg total) by mouth every 6 (six) hours as needed for mild pain (or Fever >/= 101). 05/27/18  Yes Georgette Shell, MD  apixaban (ELIQUIS) 5 MG TABS tablet Take 1 tablet (5 mg total) by mouth 2 (two) times daily. 01/10/20 04/09/20 Yes Nafziger, Tommi Rumps, NP  Dulaglutide (TRULICITY) 1.5 EX/9.3ZJ SOPN Inject 1.5 mg into the skin once a week. Friday   Yes [provider]  ezetimibe (ZETIA) 10 MG tablet Take 1 tablet (10 mg total) by mouth daily. 02/13/20  Yes [name], MD  furosemide  (LASIX) 40 MG tablet TAKE 1 TABLET(40 MG) BY MOUTH DAILY Patient taking differently: Take 40 mg by mouth daily.  10/16/19  Yes [name], MD  insulin aspart (NOVOLOG) 100 UNIT/ML injection Inject 0-15 Units into the skin 3 (three) times daily with meals. CBG 70 - 120: 0 units CBG 121 - 150: 2 units CBG 151 - 200: 3 units CBG 201 - 250: 5 units CBG 251 - 300: 8 units CBG 301 - 350: 11 units CBG 351 - 400: 15 units Patient taking differently: Inject 90 Units into the skin daily.  01/24/20  Yes Lavina Hamman, MD  lactulose (CHRONULAC) 10 GM/15ML solution Take 30 mLs (20 g total) by mouth 2 (two) times daily. 01/24/20  Yes Lavina Hamman,  MD  latanoprost (XALATAN) 0.005 % ophthalmic solution Place 1 drop into both eyes at bedtime.  03/23/19  Yes [provider]  lidocaine-prilocaine (EMLA) cream Apply 1 application topically as needed. Please apply EMLA over the Port-A-Cath site 1 hour prior to coming to our office. Patient taking differently: Apply 1 application topically daily as needed (for port).  08/17/19  Yes Volanda Napoleon, MD  mirtazapine (REMERON) 15 MG tablet Take 1 tablet (15 mg total) by mouth at bedtime. 08/17/19  Yes Nafziger, Tommi Rumps, NP  potassium chloride SA (KLOR-CON) 20 MEQ tablet One po bid x 5 days, then one po once a day 02/16/20  Yes Lajean Saver, MD  ruxolitinib phosphate (JAKAFI) 25 MG tablet Take 25 mg by mouth 2 (two) times daily.   Yes [provider]  spironolactone (ALDACTONE) 25 MG tablet Take 0.5 tablets (12.5 mg total) by mouth daily. 01/25/20  Yes Lavina Hamman, MD  timolol (TIMOPTIC) 0.5 % ophthalmic solution Place 1 drop into both eyes daily.  10/07/19  Yes [provider]  triamcinolone cream (KENALOG) 0.1 % Apply 1 application topically 2 (two) times daily. Patient taking differently: Apply 1 application topically 2 (two) times daily as needed (for rask skin).  12/11/19  Yes Nafziger, Tommi Rumps, NP  Blood Glucose Monitoring Suppl (ACCU-CHEK  GUIDE) w/Device KIT 1 kit by Does not apply route as directed. 01/25/18   [name], MD  dronabinol (MARINOL) 2.5 MG capsule Take 1 capsule (2.5 mg total) by mouth 2 (two) times daily before lunch and supper. 01/24/20   Lavina Hamman, MD  feeding supplement, ENSURE ENLIVE, (ENSURE ENLIVE) LIQD Take 237 mLs by mouth 2 (two) times daily between meals. Patient not taking: Reported on 02/16/2020 01/24/20   Lavina Hamman, MD  glucose blood (ACCU-CHEK GUIDE) test strip Use to check blood sugars 3 times daily 01/25/18   [name], MD  [facility] VERIO test strip USE TO TEST BLOOD SUGAR THREE TIMES DAILY 05/01/18   [name], MD  pantoprazole (PROTONIX) 40 MG tablet Take 1 tablet (40 mg total) by mouth 2 (two) times daily before a meal. 01/24/20   Lavina Hamman, MD    Scheduled Meds: . insulin aspart  0-6 Units Subcutaneous Q4H  . latanoprost  1 drop Both Eyes QHS  . pantoprazole (PROTONIX) IV  40 mg Intravenous Q12H  . timolol  1 drop Both Eyes Daily   Infusions: . sodium chloride    . ciprofloxacin     PRN Meds: ondansetron **OR** ondansetron (ZOFRAN) IV   Allergies as of 02/17/2020 - Review Complete 02/17/2020  Allergen Reaction Noted  . Doxycycline Hives, Swelling, and Rash   . Penicillins Swelling 05/23/2018  . Lisinopril Cough 04/08/2015  . Tape Hives 01/28/2014  . Latex Hives, Itching, and Rash 09/07/2011    Family History  Problem Relation Age of Onset  . Diabetes Mother   . Heart disease Mother        CAD  . Hyperlipidemia Mother   . Hypertension Mother   . Kidney disease Mother   . Kidney disease Father   . Breast cancer Maternal Aunt   . Heart disease Maternal Grandmother   . Heart disease Other        maternal aunts and uncles  . Colon cancer Neg Hx   . Esophageal cancer Neg Hx     Social History   Socioeconomic History  . Marital status: Married    Spouse name: [name]  . Number of children:  2  . Years of education: 2 yr colle  . Highest  education level: Not on file  Occupational History  . Occupation: housewife  Tobacco Use  . Smoking status: Former Smoker    Years: 48.00    Types: Cigarettes    Quit date: 10/18/1986    Years since quitting: 33.3  . Smokeless tobacco: Never Used  Substance and Sexual Activity  . Alcohol use: No    Alcohol/week: 0.0 standard drinks  . Drug use: No  . Sexual activity: Yes    Partners: Male    Birth control/protection: None  Other Topics Concern  . Not on file  Social History Narrative   2 caffeine drink per day.  No regular exercise.     Retired from the bank   2 children (Daughter and a son) son has morbid obesity   2 grandchildren   3 great grandchildren   Social Determinants of Radio broadcast assistant Strain:   . Difficulty of Paying Living Expenses:   Food Insecurity:   . Worried About Charity fundraiser in the Last Year:   . Arboriculturist in the Last Year:   Transportation Needs:   . Film/video editor (Medical):   Marland Kitchen Lack of Transportation (Non-Medical):   Physical Activity:   . Days of Exercise per Week:   . Minutes of Exercise per Session:   Stress:   . Feeling of Stress :   Social Connections:   . Frequency of Communication with Friends and Family:   . Frequency of Social Gatherings with Friends and Family:   . Attends Religious Services:   . Active Member of Clubs or Organizations:   . Attends Archivist Meetings:   Marland Kitchen Marital Status:   Intimate Partner Violence:   . Fear of Current or Ex-Partner:   . Emotionally Abused:   Marland Kitchen Physically Abused:   . Sexually Abused:     REVIEW OF SYSTEMS: Constitutional: Generally deconditioned and weak. ENT:  No nose bleeds Pulm: Denies shortness of breath rest.  Denies cough CV:  No palpitations, no angina.  Chronic lower extremity swelling. GU:  No hematuria, no frequency GI: See HPI Heme: See HPI Transfusions: See HPI. Neuro:  No headaches, no peripheral tingling or numbness.  No syncope or  seizures Derm:  No itching, no rash or sores.  Endocrine:  No sweats or chills.  No polyuria or dysuria Immunization: Has been vaccinated for Covid. Travel:  None   PHYSICAL EXAM: Vital signs in last 24 hours: Vitals:   02/18/20 0600 02/18/20 0745  BP:  (!) 123/51  Pulse:  76  Resp: 18 18  Temp: 98.5 F (36.9 C)   SpO2:  95%   Wt Readings from Last 3 Encounters:  02/17/20 86.2 kg  02/16/20 86.2 kg  01/18/20 98.1 kg    General: Chronically ill, obese and her feet that are going to let her eat. Head: No signs of head trauma or asymmetry. Eyes: No conjunctival pallor, no scleral icterus. Ears: Not hard of hearing Nose: No discharge or congestion Mouth: Edentulous.  Mucosa is pink, moist, clear.  Tongue midline. Neck: Obese.  No JVD, no masses. Lungs: Clear bilaterally, no labored breathing or cough Heart: RRR.  No MRG.  S1, S2 present. Abdomen: Soft, nontender, nondistended.  Obese.  No obvious hernias, organomegaly or masses..   Rectal: Deferred.  DRE per ED physician yesterday revealed melenic stool which was FOBT positive. Musc/Skeltl: No joint redness, swelling or  gross deformities. Extremities: 2-3+ pitting edema at the top of the feet and less so in the lower legs. Neurologic: Appropriate.  Oriented x3.  No tremors, no asterixis.  Moves all 4 limbs, strength not tested. Skin: No sores, no rashes. Psych: Cooperative, pleasant, states that she wants to go home.  Intake/Output from previous day: 05/02 0701 - 05/03 0700 In: 1867.3 [I.V.:200; GYFVC:944; IV Piggyback:799.3] Out: -  Intake/Output this shift: No intake/output data recorded.  LAB RESULTS: Recent Labs    02/16/20 1350 02/16/20 1350 02/17/20 1821 02/17/20 2249 02/18/20 0742  WBC 29.9*  --  34.1* 18.9*  --   HGB 7.9*   < > 7.6* 6.1* 8.8*  HCT 30.8*   < > 27.2* 21.9* 29.7*  PLT 333  --  402* 244  --    < > = values in this interval not displayed.   BMET Lab Results  Component Value Date   NA 136  02/17/2020   NA 139 02/16/2020   NA 138 01/24/2020   K 2.9 (L) 02/17/2020   K 2.6 (LL) 02/16/2020   K 3.8 01/24/2020   CL 99 02/17/2020   CL 101 02/16/2020   CL 103 01/24/2020   CO2 23 02/17/2020   CO2 22 02/16/2020   CO2 26 01/24/2020   GLUCOSE 139 (H) 02/17/2020   GLUCOSE 131 (H) 02/16/2020   GLUCOSE 136 (H) 01/24/2020   BUN 19 02/17/2020   BUN 19 02/16/2020   BUN 12 01/24/2020   CREATININE 1.49 (H) 02/17/2020   CREATININE 1.34 (H) 02/16/2020   CREATININE 1.12 (H) 01/24/2020   CALCIUM 8.2 (L) 02/17/2020   CALCIUM 8.3 (L) 02/16/2020   CALCIUM 8.3 (L) 01/24/2020   LFT Recent Labs    02/16/20 1350 02/17/20 1821  PROT 5.1* 5.1*  ALBUMIN 2.6* 2.6*  AST 36 39  ALT 11 12  ALKPHOS 101 101  BILITOT 2.0* 1.6*  BILIDIR  --  0.5*  IBILI  --  1.1*   PT/INR Lab Results  Component Value Date   INR 3.4 (H) 02/16/2020   INR 1.5 (H) 01/13/2020   INR 1.6 (H) 12/30/2019   Hepatitis Panel No results for input(s): HEPBSAG, HCVAB, HEPAIGM, HEPBIGM in the last 72 hours. C-Diff No components found for: CDIFF Lipase     Component Value Date/Time   LIPASE 27 01/13/2020 0055    Drugs of Abuse  No results found for: LABOPIA, COCAINSCRNUR, LABBENZ, AMPHETMU, THCU, LABBARB   RADIOLOGY STUDIES: DG Chest Portable 1 View  Result Date: 02/17/2020 CLINICAL DATA:  weakness EXAM: PORTABLE CHEST 1 VIEW COMPARISON:  Chest radiograph 01/13/2020 FINDINGS: Stable cardiomediastinal contours with enlarged heart size. Patient's right chest port is unchanged in position. Low lung volumes. There are mild opacities at the left lung base, similar to prior, likely atelectasis. No new focal consolidation. No pneumothorax or large pleural effusion. No acute finding in the visualized skeleton. IMPRESSION: Low lung volumes with probable left basilar atelectasis, similar to prior. Electronically Signed   By: Audie Pinto M.D.   On: 02/17/2020 17:44      IMPRESSION:   *   Recurrent acute on chronic  anemia in pt w myeloproliferative disorder  *     FOBT positive, melenic stool.  Recent EGD w atrophic gastric mucosa, nonbleeding AVMs treated with APC and clips. Suspect more AVMs within the small bowel and/or colon.  *   Adenomatous and inflammatory polyps on colonoscopy 2012, no colonoscopy since then.  *    Chronic Eliquis, last  dose 5/2.  Initiated 12/2019 for the PV thrombosis.    *     Coagulopathy.  *      NASH cirrhosis.  No evidence for portal gastropathy or varices on recent EGD 01/13/2020 CTAP w contrast showed cirrhosis, portal hypertension, splenomegaly, small abdominal pelvic ascites.  Stable nonocclusive main portal vein thrombus.  Abnormal for age endometrial thickening. AFP 12/30/2019: 3.9.  *      Hypokalemia.    PLAN:     *    Enteroscopy?,  Colonoscopy?   *    On Cipro day 2 for SBP prophylaxis.  Given that she does not have varices or portal gastritis, do we need this antibiotic?Marland Kitchen  She has history of C. difficile and intermittent diarrhea so prefer to avoid antibiotics if possible.  Will discuss SBP prophylaxis with Dr. Frederich Cha  02/18/2020, 8:50 AM Phone [phone]

## 2020-02-18 NOTE — Consult Note (Signed)
[facility] Consult: 8:50 AM 02/18/2020  LOS: 1 day    Referring Provider: Dr Leeanne Mannan  Primary Care Physician:  [name], NP Primary Gastroenterologist:  Dr. [name].     Reason for Consultation:  Anemia, recurrent acute on chronic.  FOBT+   HPI: Amanda [name] is a 79 y.o. female.  Multiple comorbidities.  Hx polycythemia vera.  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   NASH cirrhosis.  Splenomegaly.  C. difficile.  Chronic diarrhea.  Morbid obesity. OSA.  Chronic Eliquis (?for cardiac stents 2016?)  Hematologic polycythemia vera NASH cirrhosis.  Splenomegaly.  Polycythemia on Hydrea, followed by hematology.  C. difficile.  Chronic diarrhea.  Morbid obesity.  OSA.  2012 colonoscopy.  Multiple small sessile polyps (TAs and inflammatory).  Scattered melanosis. 03/2014 EGD: Nonspecific pangastritis.  Small HH.  Slightly irregular Z-line.  No varices or portal gastropathy gastric path: Reactive changes with vascular hyperemia.  No H. pylori dyspnea plasia etc.  GE junction biopsy focal erosion. 05/2018 EGD: Gastritis, with diffuse moderate inflammation/erythema; path: Chronic inactive gastritis, no H. pylori or dysplasia etc. Scattered changes in the duodenum characterized by a few white specks, path: benign small bowel mucosa, no inflammation or villous atrophy.    Hospitalized 3/12 - 01/02/20  with "pancolitis" with N/V and SIRS, hypoxic resp failure and pna.   Per Dr Carlean Purl she may not have actually had colitis, findings may have been due to ascites /hypoalbuminemia but she was treated with antibiotics.  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   Readmitted 3/28 - 01/24/20 w anorexia, FTT, persistent N/V.  Recent black stools.  FTT.  01/21/2020 EGD.  Dark, FOBT + stools and anemia in  setting Eliquis.    Gastric mucosal atrophy with congestion, likely portal hypertension.  No ulcers.  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 D/c to SNF but recently returned home.    Was in emergency department 5/1, within 24 to 48 hours of discharge from SNF, where she received potassium for hypokalemia.  Hgb 7.6, within her baseline, pt requested discharge home and declined transfusion. Returned to the ED 5/2 with weakness.  Initial Hb 7.6 on recheck it was 6.1.  8.8 after 2 PRBCs. MCV as high as 104, currently 94. FOBT +.   WBCs 34.1 >> 18.9, not unusual for this patient. INR 3.4, was 1.5 four days prior.   BUN is not elevated.  Patient not aware of any rectal bleeding or melenic stool but she is not very observant.  She says that when she left SNF she was walking with a walker for limited distances within the house.  She bruises easily but denies any excessive bleeding.  Denies abdominal pain.  Says she has not had any recent nausea or vomiting.  Appetite remains depressed.  Moves her bowels anywhere from 1-5 times daily.      Past Medical History:  Diagnosis Date  . Allergic rhinitis 01/23/2016  . C. difficile colitis   . CAD (coronary artery disease)  a. s/p STEMI in 01/2015 with 95% LCx stenosis and distal 80% LCx stenosis (DESx2 placed)  . Cancer ([place])    SKIN  . Candidiasis of skin 09/30/2014  . Cirrhosis ([place])   . Depression   . Diabetes mellitus 2008  . Gout   . Herpes simplex   . Hyperglycemia 05/31/2013  . Hyperlipidemia   . Hypertension   . Myocardial infarction ([place])   . 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 Obesity   . Polycythemia    Dr. Elease Hashimoto- [facility]  . Psoriasis     Past Surgical History:  Procedure Laterality Date  . ANKLE FRACTURE SURGERY    . BIOPSY  05/26/2018   Procedure: BIOPSY;  Surgeon: [name], MD;  Location: [facility] ENDOSCOPY;  Service: Endoscopy;;  . BREAST SURGERY Left    milk duct   . CATARACT EXTRACTION, BILATERAL    . COLONOSCOPY    . ESOPHAGOGASTRODUODENOSCOPY N/A 05/26/2018   Procedure: ESOPHAGOGASTRODUODENOSCOPY (EGD);  Surgeon: [name], MD;  Location: [facility] ENDOSCOPY;  Service: Endoscopy;  Laterality: N/A;  . ESOPHAGOGASTRODUODENOSCOPY (EGD) WITH PROPOFOL N/A 01/21/2020   Procedure: ESOPHAGOGASTRODUODENOSCOPY (EGD) WITH PROPOFOL;  Surgeon: Yetta Flock, MD;  Location: [facility];  Service: Gastroenterology;  Laterality: N/A;  . HEMOSTASIS CLIP PLACEMENT  01/21/2020   Procedure: HEMOSTASIS CLIP PLACEMENT;  Surgeon: Yetta Flock, MD;  Location: [facility] ENDOSCOPY;  Service: Gastroenterology;;  . HOT HEMOSTASIS N/A 01/21/2020   Procedure: HOT HEMOSTASIS (ARGON PLASMA COAGULATION/BICAP);  Surgeon: Yetta Flock, MD;  Location: [facility] ENDOSCOPY;  Service: Gastroenterology;  Laterality: N/A;  . IR IMAGING GUIDED PORT INSERTION  08/30/2019  . IR PARACENTESIS  05/25/2018  . LEFT HEART CATHETERIZATION WITH CORONARY ANGIOGRAM N/A 01/27/2015   Procedure: LEFT HEART CATHETERIZATION WITH CORONARY ANGIOGRAM;  Surgeon: [name], MD;  Location: [facility] CATH LAB;  Service: Cardiovascular;  Laterality: N/A;  . TONSILLECTOMY    . TOTAL ABDOMINAL HYSTERECTOMY W/ BILATERAL SALPINGOOPHORECTOMY     for heavy periods with appendectomy    Prior to Admission medications   Medication Sig Start Date End Date Taking? Authorizing Provider  acetaminophen (TYLENOL) 325 MG tablet Take 2 tablets (650 mg total) by mouth every 6 (six) hours as needed for mild pain (or Fever >/= 101). 05/27/18  Yes Georgette Shell, MD  apixaban (ELIQUIS) 5 MG TABS tablet Take 1 tablet (5 mg total) by mouth 2 (two) times daily. 01/10/20 04/09/20 Yes Nafziger, Tommi Rumps, NP  Dulaglutide (TRULICITY) 1.5 KW/4.0XB SOPN Inject 1.5 mg into the skin once a week. Friday   Yes [provider]  ezetimibe (ZETIA) 10 MG tablet Take 1 tablet (10 mg total) by mouth daily. 02/13/20  Yes [name], MD  furosemide  (LASIX) 40 MG tablet TAKE 1 TABLET(40 MG) BY MOUTH DAILY Patient taking differently: Take 40 mg by mouth daily.  10/16/19  Yes [name], MD  insulin aspart (NOVOLOG) 100 UNIT/ML injection Inject 0-15 Units into the skin 3 (three) times daily with meals. CBG 70 - 120: 0 units CBG 121 - 150: 2 units CBG 151 - 200: 3 units CBG 201 - 250: 5 units CBG 251 - 300: 8 units CBG 301 - 350: 11 units CBG 351 - 400: 15 units Patient taking differently: Inject 90 Units into the skin daily.  01/24/20  Yes Lavina Hamman, MD  lactulose (CHRONULAC) 10 GM/15ML solution Take 30 mLs (20 g total) by mouth 2 (two) times daily. 01/24/20  Yes Lavina Hamman,  MD  latanoprost (XALATAN) 0.005 % ophthalmic solution Place 1 drop into both eyes at bedtime.  03/23/19  Yes [provider]  lidocaine-prilocaine (EMLA) cream Apply 1 application topically as needed. Please apply EMLA over the Port-A-Cath site 1 hour prior to coming to our office. Patient taking differently: Apply 1 application topically daily as needed (for port).  08/17/19  Yes Volanda Napoleon, MD  mirtazapine (REMERON) 15 MG tablet Take 1 tablet (15 mg total) by mouth at bedtime. 08/17/19  Yes Nafziger, Tommi Rumps, NP  potassium chloride SA (KLOR-CON) 20 MEQ tablet One po bid x 5 days, then one po once a day 02/16/20  Yes Lajean Saver, MD  ruxolitinib phosphate (JAKAFI) 25 MG tablet Take 25 mg by mouth 2 (two) times daily.   Yes [provider]  spironolactone (ALDACTONE) 25 MG tablet Take 0.5 tablets (12.5 mg total) by mouth daily. 01/25/20  Yes Lavina Hamman, MD  timolol (TIMOPTIC) 0.5 % ophthalmic solution Place 1 drop into both eyes daily.  10/07/19  Yes [provider]  triamcinolone cream (KENALOG) 0.1 % Apply 1 application topically 2 (two) times daily. Patient taking differently: Apply 1 application topically 2 (two) times daily as needed (for rask skin).  12/11/19  Yes Nafziger, Tommi Rumps, NP  Blood Glucose Monitoring Suppl (ACCU-CHEK  GUIDE) w/Device KIT 1 kit by Does not apply route as directed. 01/25/18   [name], MD  dronabinol (MARINOL) 2.5 MG capsule Take 1 capsule (2.5 mg total) by mouth 2 (two) times daily before lunch and supper. 01/24/20   Lavina Hamman, MD  feeding supplement, ENSURE ENLIVE, (ENSURE ENLIVE) LIQD Take 237 mLs by mouth 2 (two) times daily between meals. Patient not taking: Reported on 02/16/2020 01/24/20   Lavina Hamman, MD  glucose blood (ACCU-CHEK GUIDE) test strip Use to check blood sugars 3 times daily 01/25/18   [name], MD  [facility] VERIO test strip USE TO TEST BLOOD SUGAR THREE TIMES DAILY 05/01/18   [name], MD  pantoprazole (PROTONIX) 40 MG tablet Take 1 tablet (40 mg total) by mouth 2 (two) times daily before a meal. 01/24/20   Lavina Hamman, MD    Scheduled Meds: . insulin aspart  0-6 Units Subcutaneous Q4H  . latanoprost  1 drop Both Eyes QHS  . pantoprazole (PROTONIX) IV  40 mg Intravenous Q12H  . timolol  1 drop Both Eyes Daily   Infusions: . sodium chloride    . ciprofloxacin     PRN Meds: ondansetron **OR** ondansetron (ZOFRAN) IV   Allergies as of 02/17/2020 - Review Complete 02/17/2020  Allergen Reaction Noted  . Doxycycline Hives, Swelling, and Rash   . Penicillins Swelling 05/23/2018  . Lisinopril Cough 04/08/2015  . Tape Hives 01/28/2014  . Latex Hives, Itching, and Rash 09/07/2011    Family History  Problem Relation Age of Onset  . Diabetes Mother   . Heart disease Mother        CAD  . Hyperlipidemia Mother   . Hypertension Mother   . Kidney disease Mother   . Kidney disease Father   . Breast cancer Maternal Aunt   . Heart disease Maternal Grandmother   . Heart disease Other        maternal aunts and uncles  . Colon cancer Neg Hx   . Esophageal cancer Neg Hx     Social History   Socioeconomic History  . Marital status: Married    Spouse name: [name]  . Number of children:  2  . Years of education: 2 yr colle  . Highest  education level: Not on file  Occupational History  . Occupation: housewife  Tobacco Use  . Smoking status: Former Smoker    Years: 48.00    Types: Cigarettes    Quit date: 10/18/1986    Years since quitting: 33.3  . Smokeless tobacco: Never Used  Substance and Sexual Activity  . Alcohol use: No    Alcohol/week: 0.0 standard drinks  . Drug use: No  . Sexual activity: Yes    Partners: Male    Birth control/protection: None  Other Topics Concern  . Not on file  Social History Narrative   2 caffeine drink per day.  No regular exercise.     Retired from the bank   2 children (Daughter and a son) son has morbid obesity   2 grandchildren   3 great grandchildren   Social Determinants of Radio broadcast assistant Strain:   . Difficulty of Paying Living Expenses:   Food Insecurity:   . Worried About Charity fundraiser in the Last Year:   . Arboriculturist in the Last Year:   Transportation Needs:   . Film/video editor (Medical):   Marland Kitchen Lack of Transportation (Non-Medical):   Physical Activity:   . Days of Exercise per Week:   . Minutes of Exercise per Session:   Stress:   . Feeling of Stress :   Social Connections:   . Frequency of Communication with Friends and Family:   . Frequency of Social Gatherings with Friends and Family:   . Attends Religious Services:   . Active Member of Clubs or Organizations:   . Attends Archivist Meetings:   Marland Kitchen Marital Status:   Intimate Partner Violence:   . Fear of Current or Ex-Partner:   . Emotionally Abused:   Marland Kitchen Physically Abused:   . Sexually Abused:     REVIEW OF SYSTEMS: Constitutional: Generally deconditioned and weak. ENT:  No nose bleeds Pulm: Denies shortness of breath rest.  Denies cough CV:  No palpitations, no angina.  Chronic lower extremity swelling. GU:  No hematuria, no frequency GI: See HPI Heme: See HPI Transfusions: See HPI. Neuro:  No headaches, no peripheral tingling or numbness.  No syncope or  seizures Derm:  No itching, no rash or sores.  Endocrine:  No sweats or chills.  No polyuria or dysuria Immunization: Has been vaccinated for Covid. Travel:  None   PHYSICAL EXAM: Vital signs in last 24 hours: Vitals:   02/18/20 0600 02/18/20 0745  BP:  (!) 123/51  Pulse:  76  Resp: 18 18  Temp: 98.5 F (36.9 C)   SpO2:  95%   Wt Readings from Last 3 Encounters:  02/17/20 86.2 kg  02/16/20 86.2 kg  01/18/20 98.1 kg    General: Chronically ill, obese and her feet that are going to let her eat. Head: No signs of head trauma or asymmetry. Eyes: No conjunctival pallor, no scleral icterus. Ears: Not hard of hearing Nose: No discharge or congestion Mouth: Edentulous.  Mucosa is pink, moist, clear.  Tongue midline. Neck: Obese.  No JVD, no masses. Lungs: Clear bilaterally, no labored breathing or cough Heart: RRR.  No MRG.  S1, S2 present. Abdomen: Soft, nontender, nondistended.  Obese.  No obvious hernias, organomegaly or masses..   Rectal: Deferred.  DRE per ED physician yesterday revealed melenic stool which was FOBT positive. Musc/Skeltl: No joint redness, swelling or  gross deformities. Extremities: 2-3+ pitting edema at the top of the feet and less so in the lower legs. Neurologic: Appropriate.  Oriented x3.  No tremors, no asterixis.  Moves all 4 limbs, strength not tested. Skin: No sores, no rashes. Psych: Cooperative, pleasant, states that she wants to go home.  Intake/Output from previous day: 05/02 0701 - 05/03 0700 In: 1867.3 [I.V.:200; MGNOI:370; IV Piggyback:799.3] Out: -  Intake/Output this shift: No intake/output data recorded.  LAB RESULTS: Recent Labs    02/16/20 1350 02/16/20 1350 02/17/20 1821 02/17/20 2249 02/18/20 0742  WBC 29.9*  --  34.1* 18.9*  --   HGB 7.9*   < > 7.6* 6.1* 8.8*  HCT 30.8*   < > 27.2* 21.9* 29.7*  PLT 333  --  402* 244  --    < > = values in this interval not displayed.   BMET Lab Results  Component Value Date   NA 136  02/17/2020   NA 139 02/16/2020   NA 138 01/24/2020   K 2.9 (L) 02/17/2020   K 2.6 (LL) 02/16/2020   K 3.8 01/24/2020   CL 99 02/17/2020   CL 101 02/16/2020   CL 103 01/24/2020   CO2 23 02/17/2020   CO2 22 02/16/2020   CO2 26 01/24/2020   GLUCOSE 139 (H) 02/17/2020   GLUCOSE 131 (H) 02/16/2020   GLUCOSE 136 (H) 01/24/2020   BUN 19 02/17/2020   BUN 19 02/16/2020   BUN 12 01/24/2020   CREATININE 1.49 (H) 02/17/2020   CREATININE 1.34 (H) 02/16/2020   CREATININE 1.12 (H) 01/24/2020   CALCIUM 8.2 (L) 02/17/2020   CALCIUM 8.3 (L) 02/16/2020   CALCIUM 8.3 (L) 01/24/2020   LFT Recent Labs    02/16/20 1350 02/17/20 1821  PROT 5.1* 5.1*  ALBUMIN 2.6* 2.6*  AST 36 39  ALT 11 12  ALKPHOS 101 101  BILITOT 2.0* 1.6*  BILIDIR  --  0.5*  IBILI  --  1.1*   PT/INR Lab Results  Component Value Date   INR 3.4 (H) 02/16/2020   INR 1.5 (H) 01/13/2020   INR 1.6 (H) 12/30/2019   Hepatitis Panel No results for input(s): HEPBSAG, HCVAB, HEPAIGM, HEPBIGM in the last 72 hours. C-Diff No components found for: CDIFF Lipase     Component Value Date/Time   LIPASE 27 01/13/2020 0055    Drugs of Abuse  No results found for: LABOPIA, COCAINSCRNUR, LABBENZ, AMPHETMU, THCU, LABBARB   RADIOLOGY STUDIES: DG Chest Portable 1 View  Result Date: 02/17/2020 CLINICAL DATA:  weakness EXAM: PORTABLE CHEST 1 VIEW COMPARISON:  Chest radiograph 01/13/2020 FINDINGS: Stable cardiomediastinal contours with enlarged heart size. Patient's right chest port is unchanged in position. Low lung volumes. There are mild opacities at the left lung base, similar to prior, likely atelectasis. No new focal consolidation. No pneumothorax or large pleural effusion. No acute finding in the visualized skeleton. IMPRESSION: Low lung volumes with probable left basilar atelectasis, similar to prior. Electronically Signed   By: Audie Pinto M.D.   On: 02/17/2020 17:44      IMPRESSION:   *   Recurrent acute on chronic  anemia in pt w myeloproliferative disorder  *     FOBT positive, melenic stool.  Recent EGD w atrophic gastric mucosa, nonbleeding AVMs treated with APC and clips. Suspect more AVMs within the small bowel and/or colon.  *   Adenomatous and inflammatory polyps on colonoscopy 2012, no colonoscopy since then.  *    Chronic Eliquis, last  dose 5/2.  Initiated 12/2019 for the PV thrombosis.    *     Coagulopathy.  *      NASH cirrhosis.  No evidence for portal gastropathy or varices on recent EGD 01/13/2020 CTAP w contrast showed cirrhosis, portal hypertension, splenomegaly, small abdominal pelvic ascites.  Stable nonocclusive main portal vein thrombus.  Abnormal for age endometrial thickening. AFP 12/30/2019: 3.9.  *      Hypokalemia.    PLAN:     *    Enteroscopy?,  Colonoscopy?   *    On Cipro day 2 for SBP prophylaxis.  Given that she does not have varices or portal gastritis, do we need this antibiotic?Marland Kitchen  She has history of C. difficile and intermittent diarrhea so prefer to avoid antibiotics if possible.  Will discuss SBP prophylaxis with Dr. Frederich Cha  02/18/2020, 8:50 AM Phone [phone]

## 2020-02-18 NOTE — H&P (Signed)
History and Physical    BRIONY PARVEEN SEG:[id number] DOB: 1941-06-30 DOA: 02/17/2020  PCP: [name], NP  Patient coming from: Home.  Chief Complaint: Weakness and black stools.  HPI: Amanda [name] is a 79 y.o. female with history of CAD, myeloproliferative disorder, diabetes mellitus type 2, chronic kidney disease stage III, portal vein thrombosis on Eliquis, cirrhosis of the liver and recent admission for GI bleed failure to thrive underwent EGD showed angiodysplasia underwent argon plasma coagulation and clip placement on January 21, 2020 eventually discharged to rehab and was recently discharged from rehab was found to be increasingly weak was brought to the ER a day ago and was discharged back home after hydration was found to be increasingly weak and has started having black stools again.  Patient does not complain of any abdominal pain chest pain shortness of breath.  ED Course: In the ER patient blood pressure is in the low normal with hemoglobin dropping to 6.1 stool was melanotic.  ER physician had discussed with on-call [facility]ologist Dr. Lyndel Safe will be seeing patient in consult.  2 units of PRBC transfusion was ordered.  WBC count was around 18.9 afebrile.  Potassium was 2.9 for which patient was given replacement.  Creatinine is increased from 1.3-1.4.  Albumin is 2.6.  Covid test was negative.  Review of Systems: As per HPI, rest all negative.   Past Medical History:  Diagnosis Date  . Allergic rhinitis 01/23/2016  . C. difficile colitis   . CAD (coronary artery disease)    a. s/p STEMI in 01/2015 with 95% LCx stenosis and distal 80% LCx stenosis (DESx2 placed)  . Cancer ([place])    SKIN  . Candidiasis of skin 09/30/2014  . Cirrhosis ([place])   . Depression   . Diabetes mellitus 2008  . Gout   . Herpes simplex   . Hyperglycemia 05/31/2013  . Hyperlipidemia   . Hypertension   . Myocardial infarction ([place])   . 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 Obesity    . Polycythemia    Dr. Elease Hashimoto- [facility]  . Psoriasis     Past Surgical History:  Procedure Laterality Date  . ANKLE FRACTURE SURGERY    . BIOPSY  05/26/2018   Procedure: BIOPSY;  Surgeon: [name], MD;  Location: [facility] ENDOSCOPY;  Service: Endoscopy;;  . BREAST SURGERY Left    milk duct  . CATARACT EXTRACTION, BILATERAL    . COLONOSCOPY    . ESOPHAGOGASTRODUODENOSCOPY N/A 05/26/2018   Procedure: ESOPHAGOGASTRODUODENOSCOPY (EGD);  Surgeon: [name], MD;  Location: [facility] ENDOSCOPY;  Service: Endoscopy;  Laterality: N/A;  . ESOPHAGOGASTRODUODENOSCOPY (EGD) WITH PROPOFOL N/A 01/21/2020   Procedure: ESOPHAGOGASTRODUODENOSCOPY (EGD) WITH PROPOFOL;  Surgeon: Yetta Flock, MD;  Location: [facility];  Service: Gastroenterology;  Laterality: N/A;  . HEMOSTASIS CLIP PLACEMENT  01/21/2020   Procedure: HEMOSTASIS CLIP PLACEMENT;  Surgeon: Yetta Flock, MD;  Location: [facility] ENDOSCOPY;  Service: Gastroenterology;;  . HOT HEMOSTASIS N/A 01/21/2020   Procedure: HOT HEMOSTASIS (ARGON PLASMA COAGULATION/BICAP);  Surgeon: Yetta Flock, MD;  Location: [facility] ENDOSCOPY;  Service: Gastroenterology;  Laterality: N/A;  . IR IMAGING GUIDED PORT INSERTION  08/30/2019  . IR PARACENTESIS  05/25/2018  . LEFT HEART CATHETERIZATION WITH CORONARY ANGIOGRAM N/A 01/27/2015   Procedure: LEFT HEART CATHETERIZATION WITH CORONARY ANGIOGRAM;  Surgeon: [name], MD;  Location: [facility] C[facility] LAB;  Service: Cardiovascular;  Laterality: N/A;  . TONSILLECTOMY    . TOTAL ABDOMINAL HYSTERECTOMY W/ BILATERAL  SALPINGOOPHORECTOMY     for heavy periods with appendectomy     reports that she quit smoking about 33 years ago. Her smoking use included cigarettes. She quit after 48.00 years of use. She has never used smokeless tobacco. She reports that she does not drink alcohol or use drugs.  Allergies  Allergen Reactions  . Doxycycline Hives, Swelling and Rash  . Penicillins Swelling    Has patient had a PCN  reaction causing immediate rash, facial/tongue/throat swelling, SOB or lightheadedness with hypotension: No Has patient had a PCN reaction causing severe rash involving mucus membranes or skin necrosis: No Has patient had a PCN reaction that required hospitalization: No Has patient had a PCN reaction occurring within the last 10 years: No If all of the above answers are "NO", then may proceed with Cephalosporin use.  Marland Kitchen Lisinopril Cough  . Tape Hives  . Latex Hives, Itching and Rash    Family History  Problem Relation Age of Onset  . Diabetes Mother   . Heart disease Mother        CAD  . Hyperlipidemia Mother   . Hypertension Mother   . Kidney disease Mother   . Kidney disease Father   . Breast cancer Maternal Aunt   . Heart disease Maternal Grandmother   . Heart disease Other        maternal aunts and uncles  . Colon cancer Neg Hx   . Esophageal cancer Neg Hx     Prior to Admission medications   Medication Sig Start Date End Date Taking? Authorizing Provider  acetaminophen (TYLENOL) 325 MG tablet Take 2 tablets (650 mg total) by mouth every 6 (six) hours as needed for mild pain (or Fever >/= 101). 05/27/18   Georgette Shell, MD  apixaban (ELIQUIS) 5 MG TABS tablet Take 1 tablet (5 mg total) by mouth 2 (two) times daily. 01/10/20 04/09/20  Nafziger, Tommi Rumps, NP  Blood Glucose Monitoring Suppl (ACCU-CHEK GUIDE) w/Device KIT 1 kit by Does not apply route as directed. 01/25/18   [name], MD  dronabinol (MARINOL) 2.5 MG capsule Take 1 capsule (2.5 mg total) by mouth 2 (two) times daily before lunch and supper. 01/24/20   Lavina Hamman, MD  Dulaglutide (TRULICITY) 1.5 EH/2.1YY SOPN Inject 1.5 pens into the skin every Friday.     [provider]  ezetimibe (ZETIA) 10 MG tablet Take 1 tablet (10 mg total) by mouth daily. 02/13/20   [name], MD  feeding supplement, ENSURE ENLIVE, (ENSURE ENLIVE) LIQD Take 237 mLs by mouth 2 (two) times daily between meals. Patient  not taking: Reported on 02/16/2020 01/24/20   Lavina Hamman, MD  furosemide (LASIX) 40 MG tablet TAKE 1 TABLET(40 MG) BY MOUTH DAILY Patient taking differently: Take 40 mg by mouth daily.  10/16/19   [name], MD  glucose blood (ACCU-CHEK GUIDE) test strip Use to check blood sugars 3 times daily 01/25/18   [name], MD  insulin aspart (NOVOLOG) 100 UNIT/ML injection Inject 0-15 Units into the skin 3 (three) times daily with meals. CBG 70 - 120: 0 units CBG 121 - 150: 2 units CBG 151 - 200: 3 units CBG 201 - 250: 5 units CBG 251 - 300: 8 units CBG 301 - 350: 11 units CBG 351 - 400: 15 units Patient taking differently: Inject 90 Units into the skin daily.  01/24/20   Lavina Hamman, MD  lactulose (CHRONULAC) 10 GM/15ML solution Take 30 mLs (20 g total)  by mouth 2 (two) times daily. 01/24/20   Lavina Hamman, MD  latanoprost (XALATAN) 0.005 % ophthalmic solution Place 1 drop into both eyes at bedtime.  03/23/19   [provider]  lidocaine-prilocaine (EMLA) cream Apply 1 application topically as needed. Please apply EMLA over the Port-A-Cath site 1 hour prior to coming to our office. Patient taking differently: Apply 1 application topically daily as needed (for port).  08/17/19   Volanda Napoleon, MD  mirtazapine (REMERON) 15 MG tablet Take 1 tablet (15 mg total) by mouth at bedtime. 08/17/19   Nafziger, Tommi Rumps, NP  ONETOUCH VERIO test strip USE TO TEST BLOOD SUGAR THREE TIMES DAILY 05/01/18   [name], MD  pantoprazole (PROTONIX) 40 MG tablet Take 1 tablet (40 mg total) by mouth 2 (two) times daily before a meal. 01/24/20   Lavina Hamman, MD  potassium chloride SA (KLOR-CON) 20 MEQ tablet One po bid x 5 days, then one po once a day 02/16/20   Lajean Saver, MD  spironolactone (ALDACTONE) 25 MG tablet Take 0.5 tablets (12.5 mg total) by mouth daily. 01/25/20   Lavina Hamman, MD  timolol (TIMOPTIC) 0.5 % ophthalmic solution Place 1 drop into both eyes daily.  10/07/19    [provider]  triamcinolone cream (KENALOG) 0.1 % Apply 1 application topically 2 (two) times daily. Patient taking differently: Apply 1 application topically 2 (two) times daily as needed (for rask skin).  12/11/19   [name], NP    Physical Exam: Constitutional: Moderately built and nourished. Vitals:   02/17/20 2345 02/18/20 0000 02/18/20 0015 02/18/20 0019  BP: 115/66 (!) 103/40 (!) 104/37 (!) 104/37  Pulse: 77 72 71 80  Resp:    19  Temp:    98.3 F (36.8 C)  TempSrc:    Oral  SpO2: 94% 92% 93%   Weight:      Height:       Eyes: Anicteric no pallor. ENMT: No discharge from the ears eyes nose or mouth. Neck: No mass felt.  No neck rigidity. Respiratory: No rhonchi or crepitations. Cardiovascular: S1-S2 heard. Abdomen: Soft nontender bowel sound present. Musculoskeletal: No edema. Skin: No rash. Neurologic: Alert awake oriented time place and person.  Moves all extremities. Psychiatric: Appears normal.   Labs on Admission: I have personally reviewed following labs and imaging studies  CBC: Recent Labs  Lab 02/16/20 1350 02/17/20 1821 02/17/20 2249  WBC 29.9* 34.1* 18.9*  NEUTROABS  --  27.4* 14.5*  HGB 7.9* 7.6* 6.1*  HCT 30.8* 27.2* 21.9*  MCV 104.8* 93.8 94.0  PLT 333 402* 161   Basic Metabolic Panel: Recent Labs  Lab 02/16/20 1350 02/17/20 1821  NA 139 136  K 2.6* 2.9*  CL 101 99  CO2 22 23  GLUCOSE 131* 139*  BUN 19 19  CREATININE 1.34* 1.49*  CALCIUM 8.3* 8.2*  MG 1.6*  --    GFR: Estimated Creatinine Clearance: 32 mL/min (A) (by C-G formula based on SCr of 1.49 mg/dL (H)). Liver Function Tests: Recent Labs  Lab 02/16/20 1350 02/17/20 1821  AST 36 39  ALT 11 12  ALKPHOS 101 101  BILITOT 2.0* 1.6*  PROT 5.1* 5.1*  ALBUMIN 2.6* 2.6*   No results for input(s): LIPASE, AMYLASE in the last 168 hours. Recent Labs  Lab 02/17/20 1821  AMMONIA 28   Coagulation Profile: Recent Labs  Lab 02/16/20 1350  INR 3.4*    Cardiac Enzymes: No results for input(s): CKTOTAL, CKMB, CKMBINDEX,  TROPONINI in the last 168 hours. BNP (last 3 results) No results for input(s): PROBNP in the last 8760 hours. HbA1C: No results for input(s): HGBA1C in the last 72 hours. CBG: No results for input(s): GLUCAP in the last 168 hours. Lipid Profile: No results for input(s): CHOL, HDL, LDLCALC, TRIG, CHOLHDL, LDLDIRECT in the last 72 hours. Thyroid Function Tests: No results for input(s): TSH, T4TOTAL, FREET4, T3FREE, THYROIDAB in the last 72 hours. Anemia Panel: No results for input(s): VITAMINB12, FOLATE, FERRITIN, TIBC, IRON, RETICCTPCT in the last 72 hours. Urine analysis:    Component Value Date/Time   COLORURINE YELLOW 01/14/2020 0821   APPEARANCEUR CLEAR 01/14/2020 0821   LABSPEC <1.005 (L) 01/14/2020 0821   PHURINE 5.0 01/14/2020 0821   GLUCOSEU NEGATIVE 01/14/2020 0821   GLUCOSEU NEGATIVE 01/10/2018 1217   HGBUR SMALL (A) 01/14/2020 0821   HGBUR large 05/07/2010 0845   BILIRUBINUR MODERATE (A) 01/14/2020 0821   BILIRUBINUR neg 01/26/2016 1003   KETONESUR NEGATIVE 01/14/2020 0821   PROTEINUR NEGATIVE 01/14/2020 0821   UROBILINOGEN 0.2 01/10/2018 1217   NITRITE POSITIVE (A) 01/14/2020 0821   LEUKOCYTESUR NEGATIVE 01/14/2020 0821   Sepsis Labs: @LABRCNTIP (procalcitonin:4,lacticidven:4) ) Recent Results (from the past 240 hour(s))  Respiratory Panel by RT PCR (Flu A&B, Covid) - Nasopharyngeal Swab     Status: None   Collection Time: 02/17/20  9:41 PM   Specimen: Nasopharyngeal Swab  Result Value Ref Range Status   SARS Coronavirus 2 by RT PCR NEGATIVE NEGATIVE Final    Comment: (NOTE) SARS-CoV-2 target nucleic acids are NOT DETECTED. The SARS-CoV-2 RNA is generally detectable in upper respiratoy specimens during the acute phase of infection. The lowest concentration of SARS-CoV-2 viral copies this assay can detect is 131 copies/mL. A negative result does not preclude SARS-Cov-2 infection and should  not be used as the sole basis for treatment or other patient management decisions. A negative result may occur with  improper specimen collection/handling, submission of specimen other than nasopharyngeal swab, presence of viral mutation(s) within the areas targeted by this assay, and inadequate number of viral copies (<131 copies/mL). A negative result must be combined with clinical observations, patient history, and epidemiological information. The expected result is Negative. Fact Sheet for Patients:  PinkCheek.be Fact Sheet for Healthcare Providers:  GravelBags.it This test is not yet ap proved or cleared by the [place] FDA and  has been authorized for detection and/or diagnosis of SARS-CoV-2 by FDA under an Emergency Use Authorization (EUA). This EUA will remain  in effect (meaning this test can be used) for the duration of the COVID-19 declaration under Section 564(b)(1) of the Act, 21 U.S.C. section 360bbb-3(b)(1), unless the authorization is terminated or revoked sooner.    Influenza A by PCR NEGATIVE NEGATIVE Final   Influenza B by PCR NEGATIVE NEGATIVE Final    Comment: (NOTE) The Xpert Xpress SARS-CoV-2/FLU/RSV assay is intended as an aid in  the diagnosis of influenza from Nasopharyngeal swab specimens and  should not be used as a sole basis for treatment. Nasal washings and  aspirates are unacceptable for Xpert Xpress SARS-CoV-2/FLU/RSV  testing. Fact Sheet for Patients: PinkCheek.be Fact Sheet for Healthcare Providers: GravelBags.it This test is not yet approved or cleared by the [place] FDA and  has been authorized for detection and/or diagnosis of SARS-CoV-2 by  FDA under an Emergency Use Authorization (EUA). This EUA will remain  in effect (meaning this test can be used) for the duration of the  Covid-19 declaration under Section 564(b)(1)  of the Act, 21  U.S.C. section 360bbb-3(b)(1), unless the authorization is  terminated or revoked. Performed at [facility], [place] [address], [place],  [address]      Radiological Exams on Admission: DG Chest Portable 1 View  Result Date: 02/17/2020 CLINICAL DATA:  weakness EXAM: PORTABLE CHEST 1 VIEW COMPARISON:  Chest radiograph 01/13/2020 FINDINGS: Stable cardiomediastinal contours with enlarged heart size. Patient's right chest port is unchanged in position. Low lung volumes. There are mild opacities at the left lung base, similar to prior, likely atelectasis. No new focal consolidation. No pneumothorax or large pleural effusion. No acute finding in the visualized skeleton. IMPRESSION: Low lung volumes with probable left basilar atelectasis, similar to prior. Electronically Signed   By: Audie Pinto M.D.   On: 02/17/2020 17:44    EKG: Independently reviewed.  Normal sinus rhythm.  Assessment/Plan Principal Problem:   Acute GI bleeding Active Problems:   [name] (HCC)   Essential hypertension   Chronic diastolic CHF (congestive heart failure) (HCC)   Coronary artery disease involving native coronary artery   Hepatic cirrhosis (HCC)   Type 2 diabetes mellitus with diabetic polyneuropathy (HCC)   AKI (acute kidney injury) ([place])   History of CVA (cerebrovascular accident)   CKD (chronic kidney disease), stage III   Portal vein thrombosis   AVM (arteriovenous malformation) of small bowel, acquired with hemorrhage    1. Acute GI bleed likely upper GI given the recent EGD showing angiodysplasia requiring clipping's and alcohol plasma coagulation patient is receiving 2 units of PRBC transfusion we will keep patient on antibiotic coverage for SBP coverage given history of cirrhosis.  Dr. Lyndel Safe of [facility] gastroenterology has been consulted.  We will keep n.p.o. anticipation of procedure.  Protonix IV.  Follow CBC after transfusion.  Patient is on Eliquis which  will be held due to acute GI bleed. 2. Acute blood loss anemia secondary to GI bleed follow CBC after transfusion. 3. History of cirrhosis of liver we will keep patient on SBP coverage. 4. History of myeloproliferative disorder presently n.p.o. 5. History of CAD presently n.p.o. denies any chest pain. 6. Diabetes mellitus type 2 we will keep patient sliding scale coverage.  7. 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 8. Acute on chronic kidney disease stage III creatinine mildly increased from baseline likely from GI bleed repeat metabolic panel after transfusion. 9. Hypokalemia could be from poor oral intake.  Replace recheck check magnesium with next blood draw.  Since patient has acute GI bleed with multiple comorbidities and low normal blood pressure and also patient is on Eliquis will need close monitoring for any further deterioration and will need inpatient status.   DVT prophylaxis: SCDs since patient is having active bleeding will avoid anticoagulation. Code Status: DNR. Family Communication: We will need to discuss with family. Disposition Plan: To be determined. Consults called: Copywriter, advertising. Admission status: Inpatient.   Rise Patience MD [facility] Pager [phone].  If 7PM-7AM, please contact night-coverage [email or web address] Password [facility]  02/18/2020, 12:34 AM

## 2020-02-18 NOTE — Progress Notes (Signed)
Pharmacy Antibiotic Note  Amanda [name] is a 79 y.o. female admitted on 02/17/2020 with intra-abdominal infection.  Pharmacy has been consulted for Cipro dosing.  Plan: Cipro 472m IV Q12H; consider adjustment if renal function worsens.  Height: 5' 4"  (162.6 cm) Weight: 86.2 kg (190 lb) IBW/kg (Calculated) : 54.7  Temp (24hrs), Avg:98.2 F (36.8 C), Min:98 F (36.7 C), Max:98.3 F (36.8 C)  Recent Labs  Lab 02/16/20 1350 02/17/20 1821 02/17/20 2249  WBC 29.9* 34.1* 18.9*  CREATININE 1.34* 1.49*  --     Estimated Creatinine Clearance: 32 mL/min (A) (by C-G formula based on SCr of 1.49 mg/dL (H)).    Allergies  Allergen Reactions  . Doxycycline Hives, Swelling and Rash  . Penicillins Swelling    Has patient had a PCN reaction causing immediate rash, facial/tongue/throat swelling, SOB or lightheadedness with hypotension: No Has patient had a PCN reaction causing severe rash involving mucus membranes or skin necrosis: No Has patient had a PCN reaction that required hospitalization: No Has patient had a PCN reaction occurring within the last 10 years: No If all of the above answers are "NO", then may proceed with Cephalosporin use.  .Marland KitchenLisinopril Cough  . Tape Hives  . Latex Hives, Itching and Rash     Thank you for allowing pharmacy to be a part of this patient's care.  VWynona Neat PharmD, BCPS  02/18/2020 12:59 AM

## 2020-02-18 NOTE — Progress Notes (Signed)
Chaplain responded to a consult for an AD. The chaplain gave the patient AD paperwork. The family plans to complete the paperwork at home. The chaplain does not assess a need to follow-up.  Brion Aliment Chaplain Resident For questions concerning this note please contact me by pager [phone]

## 2020-02-18 NOTE — ED Notes (Signed)
Lunch tray ordered 

## 2020-02-19 ENCOUNTER — Inpatient Hospital Stay (HOSPITAL_COMMUNITY): Payer: Medicare HMO | Admitting: Anesthesiology

## 2020-02-19 ENCOUNTER — Encounter (HOSPITAL_COMMUNITY)
Admission: EM | Disposition: A | Discharge status: Skilled Nursing Facility | Payer: Self-pay | Source: Home / Self Care | Attending: Internal Medicine

## 2020-02-19 ENCOUNTER — Encounter (HOSPITAL_COMMUNITY): Payer: Self-pay | Admitting: Internal Medicine

## 2020-02-19 ENCOUNTER — Other Ambulatory Visit: Payer: Self-pay

## 2020-02-19 ENCOUNTER — Other Ambulatory Visit: Payer: Self-pay | Admitting: Physician Assistant

## 2020-02-19 DIAGNOSIS — K2970 Gastritis, unspecified, without bleeding: Secondary | ICD-10-CM

## 2020-02-19 DIAGNOSIS — K31819 Angiodysplasia of stomach and duodenum without bleeding: Secondary | ICD-10-CM

## 2020-02-19 DIAGNOSIS — K922 Gastrointestinal hemorrhage, unspecified: Secondary | ICD-10-CM | POA: Diagnosis not present

## 2020-02-19 DIAGNOSIS — K5521 Angiodysplasia of colon with hemorrhage: Secondary | ICD-10-CM

## 2020-02-19 HISTORY — PX: ENTEROSCOPY: SHX5533

## 2020-02-19 HISTORY — PX: HOT HEMOSTASIS: SHX5433

## 2020-02-19 HISTORY — PX: BIOPSY: SHX5522

## 2020-02-19 LAB — CBC
HCT: 35 % — ABNORMAL LOW (ref 36.0–46.0)
Hemoglobin: 11 g/dL — ABNORMAL LOW (ref 12.0–15.0)
MCH: 28.2 pg (ref 26.0–34.0)
MCHC: 31.4 g/dL (ref 30.0–36.0)
MCV: 89.7 fL (ref 80.0–100.0)
Platelets: 245 10*3/uL (ref 150–400)
RBC: 3.9 MIL/uL (ref 3.87–5.11)
RDW: 18.6 % — ABNORMAL HIGH (ref 11.5–15.5)
WBC: 16.1 10*3/uL — ABNORMAL HIGH (ref 4.0–10.5)
nRBC: 0.2 % (ref 0.0–0.2)

## 2020-02-19 LAB — GLUCOSE, CAPILLARY
Glucose-Capillary: 105 mg/dL — ABNORMAL HIGH (ref 70–99)
Glucose-Capillary: 102 mg/dL — ABNORMAL HIGH (ref 70–99)
Glucose-Capillary: 113 mg/dL — ABNORMAL HIGH (ref 70–99)
Glucose-Capillary: 124 mg/dL — ABNORMAL HIGH (ref 70–99)
Glucose-Capillary: 183 mg/dL — ABNORMAL HIGH (ref 70–99)
Glucose-Capillary: 117 mg/dL — ABNORMAL HIGH (ref 70–99)

## 2020-02-19 LAB — BASIC METABOLIC PANEL
Anion gap: 10 (ref 5–15)
BUN: 15 mg/dL (ref 8–23)
CO2: 25 mmol/L (ref 22–32)
Calcium: 8 mg/dL — ABNORMAL LOW (ref 8.9–10.3)
Chloride: 105 mmol/L (ref 98–111)
Creatinine, Ser: 1.19 mg/dL — ABNORMAL HIGH (ref 0.44–1.00)
GFR calc Af Amer: 50 mL/min — ABNORMAL LOW (ref >60)
GFR calc non Af Amer: 43 mL/min — ABNORMAL LOW (ref >60)
Glucose, Bld: 95 mg/dL (ref 70–99)
Potassium: 2.9 mmol/L — ABNORMAL LOW (ref 3.5–5.1)
Sodium: 140 mmol/L (ref 135–145)

## 2020-02-19 LAB — TYPE AND SCREEN
ABO/RH(D): O POS
Antibody Screen: NEGATIVE
Unit division: 0
Unit division: 0
Unit division: 0

## 2020-02-19 LAB — MAGNESIUM: Magnesium: 1.5 mg/dL — ABNORMAL LOW (ref 1.7–2.4)

## 2020-02-19 LAB — BPAM RBC
Blood Product Expiration Date: 202105292359
Blood Product Expiration Date: 202105312359
Blood Product Expiration Date: 202105312359
ISSUE DATE / TIME: 202105030010
ISSUE DATE / TIME: 202105030246
ISSUE DATE / TIME: 202105031029
Unit Type and Rh: 5100
Unit Type and Rh: 5100
Unit Type and Rh: 5100

## 2020-02-19 LAB — POTASSIUM: Potassium: 3.4 mmol/L — ABNORMAL LOW (ref 3.5–5.1)

## 2020-02-19 SURGERY — ENTEROSCOPY
Anesthesia: Monitor Anesthesia Care

## 2020-02-19 MED ORDER — EPHEDRINE SULFATE-NACL 50-0.9 MG/10ML-% IV SOSY
PREFILLED_SYRINGE | INTRAVENOUS | Status: DC | PRN
  Administered 2020-02-19: 5 mg via INTRAVENOUS

## 2020-02-19 MED ORDER — LACTATED RINGERS IV SOLN
INTRAVENOUS | Status: DC
  Administered 2020-02-19: 1000 mL via INTRAVENOUS

## 2020-02-19 MED ORDER — SODIUM CHLORIDE 0.9 % IV SOLN
2.0000 g | INTRAVENOUS | Status: DC
  Administered 2020-02-19: 2 g via INTRAVENOUS
  Filled 2020-02-19 (×3): qty 2

## 2020-02-19 MED ORDER — CEFTAZIDIME AND DEXTROSE 2-5 GM-%(50ML) IV SOLR
2.0000 g | INTRAVENOUS | Status: DC
  Filled 2020-02-19: qty 50

## 2020-02-19 MED ORDER — SODIUM CHLORIDE 0.9 % IV SOLN: INTRAVENOUS | Status: DC

## 2020-02-19 MED ORDER — DEXTROSE 5 % IV SOLN: 2.0000 g | Freq: Three times a day (TID) | INTRAVENOUS | Status: DC

## 2020-02-19 MED ORDER — LIDOCAINE 2% (20 MG/ML) 5 ML SYRINGE
INTRAMUSCULAR | Status: DC | PRN
  Administered 2020-02-19: 60 mg via INTRAVENOUS

## 2020-02-19 MED ORDER — MAGNESIUM SULFATE 2 GM/50ML IV SOLN
2.0000 g | Freq: Once | INTRAVENOUS | Status: AC
  Administered 2020-02-19: 2 g via INTRAVENOUS
  Filled 2020-02-19: qty 50

## 2020-02-19 MED ORDER — POTASSIUM CHLORIDE 10 MEQ/50ML IV SOLN
10.0000 meq | INTRAVENOUS | Status: DC
  Filled 2020-02-19 (×2): qty 50

## 2020-02-19 MED ORDER — PROPOFOL 500 MG/50ML IV EMUL
INTRAVENOUS | Status: DC | PRN
  Administered 2020-02-19: 100 ug/kg/min via INTRAVENOUS

## 2020-02-19 MED ORDER — POTASSIUM CHLORIDE 10 MEQ/100ML IV SOLN
10.0000 meq | INTRAVENOUS | Status: DC
  Administered 2020-02-19: 10 meq via INTRAVENOUS
  Filled 2020-02-19 (×2): qty 100

## 2020-02-19 MED ORDER — POTASSIUM CHLORIDE CRYS ER 20 MEQ PO TBCR
20.0000 meq | EXTENDED_RELEASE_TABLET | Freq: Once | ORAL | Status: AC
  Administered 2020-02-19: 20 meq via ORAL
  Filled 2020-02-19: qty 1

## 2020-02-19 MED ORDER — CHLORHEXIDINE GLUCONATE CLOTH 2 % EX PADS
6.0000 | MEDICATED_PAD | Freq: Every day | CUTANEOUS | Status: DC
  Administered 2020-02-19 – 2020-02-21 (×3): 6 via TOPICAL

## 2020-02-19 MED ORDER — PROPOFOL 10 MG/ML IV BOLUS
INTRAVENOUS | Status: DC | PRN
  Administered 2020-02-19: 30 mg via INTRAVENOUS
  Administered 2020-02-19: 20 mg via INTRAVENOUS

## 2020-02-19 MED ORDER — ONDANSETRON HCL 4 MG/2ML IJ SOLN
INTRAMUSCULAR | Status: DC | PRN
  Administered 2020-02-19: 4 mg via INTRAVENOUS

## 2020-02-19 MED ORDER — BACID PO TABS
2.0000 | ORAL_TABLET | Freq: Three times a day (TID) | ORAL | Status: DC
  Administered 2020-02-19 – 2020-02-21 (×5): 2 via ORAL
  Filled 2020-02-19 (×9): qty 2

## 2020-02-19 NOTE — Anesthesia Preprocedure Evaluation (Addendum)
Anesthesia Evaluation    Reviewed: Allergy & Precautions, Patient's Chart, lab work & pertinent test results  Airway Mallampati: II  TM Distance: >3 FB     Dental  (+) Edentulous Upper, Edentulous Lower   Pulmonary sleep apnea , former smoker,    breath sounds clear to auscultation + decreased breath sounds      Cardiovascular hypertension, Pt. on medications + CAD, + Past MI, + Cardiac Stents and +CHF   Rhythm:Regular Rate:Normal  ECG: ST, rate 100  ECHO: Left ventricle: The cavity size was normal. Wall thickness was  increased in a pattern of moderate LVH. Systolic function was normal. The estimated ejection fraction was in the range of 60% to 65%. Wall motion was normal; there were no regional wall motion abnormalities. Doppler parameters are consistent with abnormal left ventricular relaxation (grade 1 diastolic dysfunction). Doppler parameters are consistent with high  ventricular filling pressure.  Mitral valve: Calcified annulus. There was mild regurgitation.  Left atrium: The atrium was moderately dilated.    Neuro/Psych PSYCHIATRIC DISORDERS Anxiety Depression negative neurological ROS     GI/Hepatic negative GI ROS, (+) Cirrhosis       ,   Endo/Other  diabetes, Insulin Dependent  Renal/GU Renal InsufficiencyRenal disease     Musculoskeletal Gout   Abdominal (+) - obese,   Peds  Hematology  (+) anemia , HLD   Anesthesia Other Findings  anemia, hemoccult positive stool  Reproductive/Obstetrics                            Anesthesia Physical  Anesthesia Plan  ASA: III  Anesthesia Plan: MAC   Post-op Pain Management:    Induction: Intravenous  PONV Risk Score and Plan: 2 and Propofol infusion and Treatment may vary due to age or medical condition  Airway Management Planned: Nasal Cannula  Additional Equipment:   Intra-op Plan:   Post-operative Plan:   Informed Consent:  I have reviewed the patients History and Physical, chart, labs and discussed the procedure including the risks, benefits and alternatives for the proposed anesthesia with the patient or authorized representative who has indicated his/her understanding and acceptance.     Dental advisory given  Plan Discussed with: CRNA  Anesthesia Plan Comments:        Anesthesia Quick Evaluation

## 2020-02-19 NOTE — Transfer of Care (Signed)
Immediate Anesthesia Transfer of Care Note  Patient: Amanda [name]  Procedure(s) Performed: ENTEROSCOPY (N/A ) HOT HEMOSTASIS (ARGON PLASMA COAGULATION/BICAP) (N/A ) BIOPSY  Patient Location: Endoscopy Unit  Anesthesia Type:MAC  Level of Consciousness: awake, alert  and oriented  Airway & Oxygen Therapy: Patient Spontanous Breathing and Patient connected to nasal cannula oxygen  Post-op Assessment: Report given to RN and Post -op Vital signs reviewed and stable  Post vital signs: Reviewed and stable  Last Vitals:  Vitals Value Taken Time  BP 175/86   Temp    Pulse 84   Resp 16   SpO2 97%     Last Pain:  Vitals:   02/19/20 1103  TempSrc: Oral  PainSc: 0-No pain         Complications: No apparent anesthesia complications

## 2020-02-19 NOTE — Patient Outreach (Signed)
[facility]) Care Management  02/19/2020  NAKAI YARD 12-30-1940 [id number]     Transition of Care Referral  Referral Date: 02/19/2020 Referral Source: [facility] Discharge Report Date of Discharge: 5/02/20201 Facility: [facility] Insurance: [facility]    Referral received. Upon chart review, noted that patient is currently hospitalized.    Plan: RN CM will close case at this time.  Enzo Montgomery, RN,BSN,CCM [place] Management Telephonic Care Management Coordinator Direct Phone: [phone] Toll Free: [phone] Fax: [phone]

## 2020-02-19 NOTE — Progress Notes (Signed)
K 2.9 this AM. Patient has orders for 3 runs of potassium. First one due at 0630, has not been started as of 7:38 AM. Call placed to primary RN and advised patient needs 2 runs prior to pick up at 0945.

## 2020-02-19 NOTE — Op Note (Signed)
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 Estimated Blood Loss:     Estimated blood loss: none. Procedure:                Pre-Anesthesia Assessment:                           - Prior to the procedure, a History and Physical                            was performed, and patient medications and                            allergies were reviewed. The patient's tolerance of                            previous anesthesia was also reviewed. The risks                            and benefits of the procedure and the sedation                            options and risks were discussed with the patient.                            All questions were answered, and informed consent  was obtained. Prior Anticoagulants: The patient has                            taken Eliquis (apixaban), last dose was 2 days                            prior to procedure. ASA Grade Assessment: III - A                            patient with severe systemic disease. After                            reviewing the risks and benefits, the patient was                            deemed  in satisfactory condition to undergo the                            procedure.                           After obtaining informed consent, the endoscope was                            passed under direct vision. Throughout the                            procedure, the patient's blood pressure, pulse, and                            oxygen saturations were monitored continuously. The                            PCF-H190DL ([id number] Olympus pediatric colonscope                            was introduced through the mouth and advanced to                            the jejunum, to the 160 cm mark (from the                            incisors). The small bowel enteroscopy was                            accomplished without difficulty. The patient                            tolerated the procedure well. Scope In: Scope Out: Findings:      The distal esophagus was mildly tortuous. Z-line is 38 cm from the       incisors. No mucosal abnormalities seen.      Diffuse mild inflammation was found in the gastric antrum. The       prepyloric mucosa had an apperance  consistent with atrophic mucosa. No       ulcers or erosions seen. Biopsies were taken with a cold forceps for       histology. Estimated blood loss was minimal.      Two endoclips were found in the second portion of the duodenum. Multiple       benign lymphagiectasias seen in the duodenum and jejunum.      Four, small nearly punctate angioectasias with no bleeding were found in       the distal duodenum and jejunum from 100 cm to 120 cm (from the       incisors). Coagulation for bleeding prevention using argon plasma.       Estimated blood loss was none. No blood was seen in the small       intestines. No evidence for recent bleeding. Impression:               - Tortuous esophagus.                           - Gastritis. Biopsied.                           - Endoclips seen in the duodenum from last                            endoscopy.                            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 Treated with argon plasma                            coagulation (APC).                           - No evidence for active or recent bleeding. Recommendation:           - Resume previous diet.                           - Continue present medications.                           - May resume Eliquis if necessary/clinically                            appropriate.                           - Await pathology results.                           - Serial hgb/hct with transfusion as indicated.                           - Results were reviewed with the patient's husband  by phone. Procedure Code(s):        --- Professional ---                           [phone], 36, Small intestinal endoscopy, enteroscopy                            beyond second portion of duodenum, not including                            ileum; with control of bleeding (eg, injection,                            bipolar cautery, unipolar cautery, laser, heater                            probe, stapler, plasma coagulator)                           44361, 80, Small intestinal endoscopy, enteroscopy                            beyond second portion of duodenum, not including                            ileum; with biopsy, single or multiple Diagnosis Code(s):        --- Professional ---                           K29.70, Gastritis, unspecified, without bleeding                           T18.3XXS, Foreign body in small intestine, sequela                           K55.20, Angiodysplasia of colon without hemorrhage                           K31.819, Angiodysplasia of stomach and duodenum                            without bleeding CPT copyright 2019 [employer]. All rights reserved. The codes documented in this report are preliminary and upon coder review may  be revised to meet  current compliance requirements. Thornton Park MD, MD 02/19/2020 12:24:05 PM This report has been signed electronically. Number of Addenda: 0

## 2020-02-19 NOTE — Anesthesia Postprocedure Evaluation (Signed)
Anesthesia Post Note  Patient: Amanda [name]  Procedure(s) Performed: ENTEROSCOPY (N/A ) HOT HEMOSTASIS (ARGON PLASMA COAGULATION/BICAP) (N/A ) BIOPSY     Patient location during evaluation: PACU Anesthesia Type: MAC Level of consciousness: awake and alert Pain management: pain level controlled Vital Signs Assessment: post-procedure vital signs reviewed and stable Respiratory status: spontaneous breathing Cardiovascular status: stable Anesthetic complications: no    Last Vitals:  Vitals:   02/19/20 1240 02/19/20 1335  BP: 113/67 (!) 117/54  Pulse: 85 75  Resp: 17 18  Temp:  (!) 36.3 C  SpO2: 93% 97%    Last Pain:  Vitals:   02/19/20 1335  TempSrc: Oral  PainSc:                  Nolon Nations

## 2020-02-19 NOTE — Evaluation (Signed)
Physical Therapy Evaluation Patient Details Name: Amanda [name] MRN: [id number] DOB: 08/18/1941 Today's Date: 02/19/2020   History of Present Illness  79 y.o. 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 Pt d/c to SNF, had ED admission on 5/1 after SNF d/c for low Hgb and then was d/c. Pt back to ED from home on 5/2 for weakness, inability to mobilize. s/p Small bowel enteroscopy on 5/4 with reaveals gastritis and no active bleeding found.  Clinical Impression   Pt presents with generalized weakness, significant fear of falling, impaired sitting and standing balance, decreased activity tolerance. Pt to benefit from acute PT to address deficits. Pt required mod-max assist for bed mobility and transfer to standing, tolerating 2 steps towards HOB only and unable to progress to gait this day. Per pt, she was walking short distances at rehab but did not make great progress. PT recommending return to SNF to maximize function and work towards pt goal of returning to ambulation. Pt wishes to return to SNF, preference for [facility].  PT to progress mobility as tolerated, and will continue to follow acutely.      Follow Up Recommendations SNF;Supervision/Assistance - 24 hour    Equipment Recommendations  None recommended by PT    Recommendations for Other Services       Precautions / Restrictions Precautions Precautions: Fall Precaution Comments: fear of falling Restrictions Weight Bearing Restrictions: No      Mobility  Bed Mobility Overal bed mobility: Needs Assistance Bed Mobility: Rolling;Sidelying to Sit;Sit to Supine Rolling: Mod assist Sidelying to sit: Mod assist;HOB elevated   Sit to supine:  Max assist;HOB elevated   General bed mobility comments: mod assist for rolling and sidelying to sit for trunk management, LE lifting and translation off EOB, and scooting to EOB with use of truncal rocking facilitation and bed pads. Increased time and effort. Max assist for return to supine for trunk and LE management, scooting up in bed with use of bed pads.  Transfers Overall transfer level: Needs assistance Equipment used: Rolling walker (2 wheeled) Transfers: Sit to/from Stand Sit to Stand: Mod assist;From elevated surface         General transfer comment: Mod assist for power up, steadying. Increased time to rise to upright, verbal cuing for hand placement when rising. Pt able to take 2 steps to [facility] only, and abruptly sat due to fear of falling.  Ambulation/Gait             General Gait Details: unable this day.  Stairs            Wheelchair Mobility    Modified Rankin (Stroke Patients Only)       Balance Overall balance assessment: Needs assistance;History of Falls Sitting-balance support: Feet supported;Bilateral upper extremity supported Sitting balance-Leahy Scale: Fair     Standing balance support: Bilateral upper extremity supported;During functional activity Standing balance-Leahy Scale: Poor Standing balance comment: reliant on external assist                             Pertinent Vitals/Pain Pain Assessment: No/denies pain    Home Living Family/patient expects to be discharged to:: Skilled nursing facility Living Arrangements: Spouse/significant other  Prior Function Level of Independence: Needs assistance   Gait / Transfers Assistance Needed: At rehab facility, pt ambulating short distances with chair follow. PTA while at home for 1 day, pt unable to transfer or get out of bed and needed neighbor assist just to get out of her car to the wheelchair upon d/c from hospital.  ADL's / Homemaking Assistance  Needed: When asked who cooks, cleans, dresses pt, and bathes pt, pt reponds by pointing to husband  Comments: has RW, rollator, w/c, and BSC at home. Per husband, pt has been using w/c for longer distance mobility for quite some time but up until last hospitalization, pt was able to transfer with standby assist     Hand Dominance   Dominant Hand: Right    Extremity/Trunk Assessment   Upper Extremity Assessment Upper Extremity Assessment: Generalized weakness    Lower Extremity Assessment Lower Extremity Assessment: Generalized weakness    Cervical / Trunk Assessment Cervical / Trunk Assessment: Normal  Communication   Communication: No difficulties  Cognition Arousal/Alertness: Awake/alert Behavior During Therapy: WFL for tasks assessed/performed;Anxious Overall Cognitive Status: Within Functional Limits for tasks assessed                                 General Comments: hard of hearing, but A&Ox4 and interactive with PT. Very anxious about standing due to fear of falling.      General Comments General comments (skin integrity, edema, etc.): vss    Exercises     Assessment/Plan    PT Assessment Patient needs continued PT services  PT Problem List Decreased strength;Decreased mobility;Decreased activity tolerance;Decreased balance;Decreased knowledge of use of DME;Pain;Decreased safety awareness;Obesity       PT Treatment Interventions DME instruction;Therapeutic activities;Gait training;Therapeutic exercise;Patient/family education;Balance training;Functional mobility training;Neuromuscular re-education    PT Goals (Current goals can be found in the Care Plan section)  Acute Rehab PT Goals Patient Stated Goal: get back to walking PT Goal Formulation: With patient Time For Goal Achievement: 03/04/20 Potential to Achieve Goals: Good    Frequency Min 2X/week   Barriers to discharge        Co-evaluation               AM-PAC PT "6 Clicks"  Mobility  Outcome Measure Help needed turning from your back to your side while in a flat bed without using bedrails?: A Lot Help needed moving from lying on your back to sitting on the side of a flat bed without using bedrails?: A Lot Help needed moving to and from a bed to a chair (including a wheelchair)?: A Lot Help needed standing up from a chair using your arms (e.g., wheelchair or bedside chair)?: A Lot Help needed to walk in hospital room?: A Lot Help needed climbing 3-5 steps with a railing? : Total 6 Click Score: 11    End of Session Equipment Utilized During Treatment: Gait belt Activity Tolerance: Patient limited by fatigue;Patient tolerated treatment well Patient left: in bed;with call bell/phone within reach;with family/visitor present(unable to get bed to turn on to set bed alarm, checked all plugs and appear to be plugged in. [name] at bedside)   PT Visit Diagnosis: Other abnormalities of gait and mobility (R26.89);Muscle weakness (generalized) (M62.81);History of falling (Z91.81)    Time: 2956-2130 PT Time Calculation (min) (ACUTE ONLY): 17 min   Charges:   PT Evaluation $PT Eval Low Complexity: 1 Low  [place] Pager [phone]  Office [place] 02/19/2020, 3:59 PM

## 2020-02-19 NOTE — Progress Notes (Signed)
PROGRESS NOTE    Patient: Amanda [name]                            PCP: [name], NP                    DOB: January 29, 1941            DOA: 02/17/2020 [id number]             DOS: 02/19/2020, 9:12 AM   LOS: 2 days   Date of Service: The patient was seen and examined on 02/19/2020  Subjective:   The patient was seen and examined this morning, awake alert oriented x4 no acute distress reporting of no active rectal bleed.  Has had 1 bowel movement overnight. Tolerated total of 3 units of PRBC transfusion yesterday. Denies of having any shortness of breath or chest pain.  Husband present at bedside.  Updated, The patient and her husband was informed regarding her choice of anticoagulation near future as it is complicated by recurrent GI bleed.  Brief Narrative:    Amanda [name] is a 79 y.o. 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   Once again presented from rehab with chief complaint of be increasingly weak and has started having black stools again.  Patient's hemoglobin on arrival was 6.1. GI Dr. Lyndel Safe was notified. Patient was admitted and initiated 3 units of PRBC transfusion. Also had a leukocytosis with WBC of 18.9, with UA consistent with UTI.  Potassium was 2.9.  Assessment & Plan:   Principal Problem:   Acute GI bleeding Active Problems:   [name] (HCC)   Essential hypertension   Chronic diastolic CHF (congestive heart failure) (HCC)   Coronary artery disease involving native coronary artery   Hepatic cirrhosis (HCC)   Type 2 diabetes mellitus with diabetic polyneuropathy (HCC)   AKI (acute kidney injury) ([place])   History of CVA (cerebrovascular accident)   CKD (chronic  kidney disease), stage III   Portal vein thrombosis   AVM (arteriovenous malformation) of small bowel, acquired with hemorrhage   Acute GI bleed  -Remained stable, GI following, pending repeat EGD this morning 02/19/2020 -Status post 3U PRBC transfusion on 02/18/2020 -Hemoglobin improved from 6.1-to 11.0 this morning  -Likely upper GI status post recent EGD showing  showing angiodysplasia requiring clipping's and alcohol plasma coagulation  -Monitoring H&H -NPO --in anticipation of EGD -PGI recommending no prophylactic antibiotics as there is no varices or portal gastritis  Dr. Lyndel Safe of [facility]     -We will continue Protonix IV.  -Eliquis on hold  Acute blood loss anemia secondary to GI bleed follow CBC after transfusion -Improved H&H, monitoring  History of cirrhosis -status post recent EGD negative for varices or portal gastritis-GI recommending no SBP prophylaxis   History of myeloproliferative disorder presently n.p.o.  History of CAD presently n.p.o. denies any chest pain.  Diabetes mellitus type 2 we will keep patient sliding scale coverage.   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  Acute on chronic kidney disease stage III creatinine mildly increased from baseline likely from GI bleed repeat metabolic panel after transfusion... Improved creatinine  with transfusion.  Hypokalemia could be from poor oral intake.  Replace recheck check magnesium with labs  Repleting IV today with 3 runs.. Rechecking potassium magnesium  UTI-we will follow with urine culture,  Cipro was DC'd due to mild QTC prolongation, initiated Rocephin for 3 days     Nutritional status:         Cultures; Urine Culture  >>>    Antimicrobials: 02/18/2020 ciprofloxacin --switch to IV Rocephin x3 days    Consultants: Gastroenterologist Dr.  Lyndel Safe   -----------------------------------------------------------------------------------------------------------------  DVT prophylaxis: SCD/Compression stockings Code Status:   Code Status: DNR Family Communication: Husband present at bedside-updated The above findings and plan of care has been discussed with patient (and her husband)  in detail,  they expressed understanding and agreement of above. -Advance care planning has been discussed.   Admission status:   Status is: Inpatient  Remains inpatient appropriate because:Hemodynamically unstable, Persistent severe electrolyte disturbances, Ongoing diagnostic testing needed not appropriate for outpatient work up and Inpatient level of care appropriate due to severity of illness   Dispo: The patient is from: SNF              Anticipated d/c is to: SNF              Anticipated d/c date is: 2 days              Patient currently is not medically stable to d/c.  Due to active GI bleed,  symptomatic anemia           Procedures:   No admission procedures for hospital encounter.     Antimicrobials:  Anti-infectives (From admission, onward)   Start     Dose/Rate Route Frequency Ordered Stop   02/18/20 1600  cefTRIAXone (ROCEPHIN) 1 g in sodium chloride 0.9 % 100 mL IVPB     1 g 200 mL/hr over 30 Minutes Intravenous Every 24 hours 02/18/20 1508 02/21/20 1559   02/18/20 1200  ciprofloxacin (CIPRO) IVPB 400 mg  Status:  Discontinued     400 mg 200 mL/hr over 60 Minutes Intravenous Every 12 hours 02/18/20 0058 02/18/20 1508   02/18/20 0100  ciprofloxacin (CIPRO) IVPB 400 mg     400 mg 200 mL/hr over 60 Minutes Intravenous  Once 02/18/20 0058 02/18/20 0414       Medication:  . insulin aspart  0-6 Units Subcutaneous Q4H  . lactobacillus acidophilus  2 tablet Oral TID  . latanoprost  1 drop Both Eyes QHS  . pantoprazole  40 mg Oral BID  . timolol  1 drop Both Eyes Daily    ondansetron **OR** ondansetron (ZOFRAN)  IV   Objective:   Vitals:   02/18/20 2135 02/19/20 0030 02/19/20 0407 02/19/20 0749  BP: (!) 133/56 139/69 (!) 142/73 (!) 146/74  Pulse:   77   Resp:    16  Temp: 97.8 F (36.6 C) 98.5 F (36.9 C) 98 F (36.7 C) 98.1 F (36.7 C)  TempSrc: Oral Oral Oral Oral  SpO2: 93% 94% 92% 92%  Weight:      Height:        Intake/Output Summary (Last 24 hours) at 02/19/2020 0912 Last data filed at 02/19/2020 0700 Gross per 24 hour  Intake 754.5 ml  Output 275 ml  Net 479.5 ml   Filed Weights   02/17/20 1635  Weight: 86.2 kg     Examination:      Physical Exam  Constitution:  Alert, cooperative, no distress,  Psychiatric: Normal and  stable mood and affect, cognition intact,   HEENT: Normocephalic, PERRL, otherwise with in Normal limits  Chest:Chest symmetric Cardio vascular:  S1/S2, RRR, No murmure, No Rubs or Gallops  pulmonary: Clear to auscultation bilaterally, respirations unlabored, negative wheezes / crackles Abdomen: Soft, non-tender, non-distended, bowel sounds,no masses, no organomegaly Muscular skeletal: Limited exam - in bed, able to move all 4 extremities, Normal strength,  Neuro: CNII-XII intact. , normal motor and sensation, reflexes intact  Extremities: No pitting edema lower extremities, +2 pulses  Skin: Dry, warm to touch, negative for any Rashes, No open wounds Wounds: per nursing documentation     ------------------------------------------------------------------------------------------------------------------------------------------    LABs:  CBC Latest Ref Rng & Units 02/19/2020 02/18/2020 02/18/2020  WBC 4.0 - 10.5 K/uL 16.1(H) - -  Hemoglobin 12.0 - 15.0 g/dL 11.0(L) 12.7 8.8(L)  Hematocrit 36.0 - 46.0 % 35.0(L) 39.6 29.7(L)  Platelets 150 - 400 K/uL 245 - -   CMP Latest Ref Rng & Units 02/19/2020 02/17/2020 02/16/2020  Glucose 70 - 99 mg/dL 95 139(H) 131(H)  BUN 8 - 23 mg/dL 15 19 19   Creatinine 0.44 - 1.00 mg/dL 1.19(H) 1.49(H) 1.34(H)  Sodium 135 - 145  mmol/L 140 136 139  Potassium 3.5 - 5.1 mmol/L 2.9(L) 2.9(L) 2.6(LL)  Chloride 98 - 111 mmol/L 105 99 101  CO2 22 - 32 mmol/L 25 23 22   Calcium 8.9 - 10.3 mg/dL 8.0(L) 8.2(L) 8.3(L)  Total Protein 6.5 - 8.1 g/dL - 5.1(L) 5.1(L)  Total Bilirubin 0.3 - 1.2 mg/dL - 1.6(H) 2.0(H)  Alkaline Phos 38 - 126 U/L - 101 101  AST 15 - 41 U/L - 39 36  ALT 0 - 44 U/L - 12 11       Micro Results Recent Results (from the past 240 hour(s))  Respiratory Panel by RT PCR (Flu A&B, Covid) - Nasopharyngeal Swab     Status: None   Collection Time: 02/17/20  9:41 PM   Specimen: Nasopharyngeal Swab  Result Value Ref Range Status   SARS Coronavirus 2 by RT PCR NEGATIVE NEGATIVE Final    Comment: (NOTE) SARS-CoV-2 target nucleic acids are NOT DETECTED. The SARS-CoV-2 RNA is generally detectable in upper respiratoy specimens during the acute phase of infection. The lowest concentration of SARS-CoV-2 viral copies this assay can detect is 131 copies/mL. A negative result does not preclude SARS-Cov-2 infection and should not be used as the sole basis for treatment or other patient management decisions. A negative result may occur with  improper specimen collection/handling, submission of specimen other than nasopharyngeal swab, presence of viral mutation(s) within the areas targeted by this assay, and inadequate number of viral copies (<131 copies/mL). A negative result must be combined with clinical observations, patient history, and epidemiological information. The expected result is Negative. Fact Sheet for Patients:  PinkCheek.be Fact Sheet for Healthcare Providers:  GravelBags.it This test is not yet ap proved or cleared by the [place] FDA and  has been authorized for detection and/or diagnosis of SARS-CoV-2 by FDA under an Emergency Use Authorization (EUA). This EUA will remain  in effect (meaning this test can be used) for the duration  of the COVID-19 declaration under Section 564(b)(1) of the Act, 21 U.S.C. section 360bbb-3(b)(1), unless the authorization is terminated or revoked sooner.    Influenza A by PCR NEGATIVE NEGATIVE Final   Influenza B by PCR NEGATIVE NEGATIVE Final    Comment: (NOTE) The Xpert Xpress SARS-CoV-2/FLU/RSV assay is intended as an aid in  the diagnosis of influenza from  Nasopharyngeal swab specimens and  should not be used as a sole basis for treatment. Nasal washings and  aspirates are unacceptable for Xpert Xpress SARS-CoV-2/FLU/RSV  testing. Fact Sheet for Patients: PinkCheek.be Fact Sheet for Healthcare Providers: GravelBags.it This test is not yet approved or cleared by the [place] FDA and  has been authorized for detection and/or diagnosis of SARS-CoV-2 by  FDA under an Emergency Use Authorization (EUA). This EUA will remain  in effect (meaning this test can be used) for the duration of the  Covid-19 declaration under Section 564(b)(1) of the Act, 21  U.S.C. section 360bbb-3(b)(1), unless the authorization is  terminated or revoked. Performed at [facility], [place] [address] [place], [place] [address]   Urine culture     Status: Abnormal (Preliminary result)   Collection Time: 02/18/20  3:16 AM   Specimen: Urine, Random  Result Value Ref Range Status   Specimen Description URINE, RANDOM  Final   Special Requests   Final    NONE Performed at [facility], [place] [address] [place], [place] [address]    Culture >=100,000 COLONIES/mL GRAM NEGATIVE RODS (A)  Final   Report Status PENDING  Incomplete    Radiology Reports DG Chest Portable 1 View  Result Date: 02/17/2020 CLINICAL DATA:  weakness EXAM: PORTABLE CHEST 1 VIEW COMPARISON:  Chest radiograph 01/13/2020 FINDINGS: Stable cardiomediastinal contours with enlarged heart size. Patient's right chest port is unchanged in position. Low lung volumes. There  are mild opacities at the left lung base, similar to prior, likely atelectasis. No new focal consolidation. No pneumothorax or large pleural effusion. No acute finding in the visualized skeleton. IMPRESSION: Low lung volumes with probable left basilar atelectasis, similar to prior. Electronically Signed   By: Audie Pinto M.D.   On: 02/17/2020 17:44    SIGNED: Deatra James, MD, FACP, FHM. [facility],  Pager (please use [email or web address] to page/text)  If 7PM-7AM, please contact night-coverage [email or web address], Password [facility] 02/19/2020, 9:12 AM

## 2020-02-19 NOTE — TOC Initial Note (Signed)
Transition of Care [facility]) - Initial/Assessment Note    Patient Details  Name: Amanda [name] MRN: [id number] Date of Birth: [birth year]  [facility] CM/SW Contact:    Jacquelynn Cree Phone Number: 02/19/2020, 2:12 PM  Clinical Narrative:                 CSW was contacted to speak with patient's spouse to discuss a discharge plan.  CSW contacted patient's spouse [name] [name]). Spouse expressed patient will need SNF placement at discharge due to her inability to walk. Spouse expressed patient has previously been to [facility] and he would like her to go to [facility]. CSW discussed insurance process and agreed to request patient to be seen by PT/OT.  CSW requested PT/OT consult for MD. [facility] team will continue to follow.  Expected Discharge Plan: Skilled Nursing Facility Barriers to Discharge: Ship broker, Continued Medical Work up   Patient Goals and CMS Choice   CMS Medicare.gov Compare Post Acute Care list provided to:: Patient Represenative (must comment)([name]) Choice offered to / list presented to : Spouse  Expected Discharge Plan and Services Expected Discharge Plan: [place] arrangements for the past 2 months: Single Family Home                                      Prior Living Arrangements/Services Living arrangements for the past 2 months: Single Family Home Lives with:: Self Patient language and need for interpreter reviewed:: Yes Do you feel safe going back to the place where you live?: Yes      Need for Family Participation in Patient Care: Yes (Comment) Care giver support system in place?: Yes (comment)   Criminal Activity/Legal Involvement Pertinent to Current Situation/Hospitalization: No - Comment as needed  Activities of Daily Living Home Assistive Devices/Equipment: Shower chair with back, Walker (specify type) ADL Screening (condition at time of admission) Patient's cognitive  ability adequate to safely complete daily activities?: Yes Is the patient deaf or have difficulty hearing?: No Does the patient have difficulty seeing, even when wearing glasses/contacts?: No Does the patient have difficulty concentrating, remembering, or making decisions?: No Patient able to express need for assistance with ADLs?: Yes Does the patient have difficulty dressing or bathing?: No Independently performs ADLs?: Yes (appropriate for developmental age) Does the patient have difficulty walking or climbing stairs?: Yes Weakness of Legs: Both Weakness of Arms/Hands: Both  Permission Sought/Granted Permission sought to share information with : Facility Art therapist granted to share information with : Yes, Verbal Permission Granted  Share Information with NAME: [name]  Permission granted to share info w AGENCY: SNF  Permission granted to share info w Relationship: Spouse  Permission granted to share info w Contact Information: [phone]  Emotional Assessment   Attitude/Demeanor/Rapport: Unable to Assess Affect (typically observed): Unable to Assess Orientation: : Oriented to Self Alcohol / Substance Use: Not Applicable Psych Involvement: No (comment)  Admission diagnosis:  Acute GI bleeding [K92.2] AKI (acute kidney injury) ([place]) [N17.9] Gastrointestinal hemorrhage, unspecified gastrointestinal hemorrhage type [K92.2] Patient Active Problem List   Diagnosis Date Noted  . Gastritis and gastroduodenitis   . Acute GI bleeding 02/17/2020  . Weakness generalized   . AVM (arteriovenous malformation) of small bowel, acquired with hemorrhage   . Malnutrition of moderate degree 01/16/2020  . Goals of care, counseling/discussion   . Palliative  care by specialist   . Nausea & vomiting 01/13/2020  . Failure to thrive in adult 01/13/2020  . Myeloproliferative disorder ([place]) 01/13/2020  . Portal vein thrombosis   . SIRS (systemic inflammatory response  syndrome) ([place]) 12/28/2019  . Worsening lower extremity weakness 12/14/2019  . Leg swelling 08/21/2019  . Hypotension due to hypovolemia 08/10/2019  . CKD (chronic kidney disease), stage III 08/10/2019  . Acute blood loss anemia 05/23/2018  . Dehydration 03/22/2018  . AKI (acute kidney injury) ([place]) 03/22/2018  . History of CVA (cerebrovascular accident) 03/22/2018  . Hip injury, left, subsequent encounter 11/08/2017  . Iron deficiency anemia 12/22/2016  . OSA (obstructive sleep apnea) 04/21/2016  . Osteopenia 04/07/2016  . Lung nodule, solitary 02/05/2016  . Aortic dilatation ([place]) 02/05/2016  . Dyspnea 01/28/2016  . Cirrhosis ([place]) 01/28/2016  . Allergic rhinitis 01/23/2016  . Hepatic cirrhosis ([place]) 10/28/2015  . Irritable bowel syndrome 08/12/2015  . Obesity (BMI 30-39.9) 04/30/2015  . Diabetic retinopathy of both eyes ([place]) 04/18/2015  . Former smoker 04/18/2015  . GAD (generalized anxiety disorder) 04/18/2015  . Status post primary angioplasty with coronary stent 04/18/2015  . Coronary artery disease involving native coronary artery 03/01/2015  . Chronic diastolic CHF (congestive heart failure) ([place]) 02/01/2015  . ST elevation myocardial infarction (STEMI) involving left circumflex coronary artery in recovery phase ([place]) 01/27/2015  . Idioventricular rhythm ([place])   . Diabetic peripheral neuropathy associated with type 2 diabetes mellitus ([place]) 01/23/2015  . Type 2 diabetes mellitus with diabetic polyneuropathy ([place]) 01/23/2015  . Thrombocytosis ([place]) 07/04/2014  . Cholelithiasis 02/07/2014  . Chronic diarrhea 01/29/2014  . Leukocytosis 06/02/2013  . [name] ([place]) 05/31/2013  . Essential hypertension 05/31/2013  . History of Bell's palsy 05/26/2013  . DERMATITIS, ATOPIC 11/16/2010  . DIZZINESS 11/16/2010  . DIASTOLIC DYSFUNCTION 20/25/4270  . Hyperlipidemia 12/15/2009  . Essential hypertension, benign 11/24/2009  . PSORIASIS 11/24/2009  . Depression  09/13/2009  . 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[place]) 07/08/2008   PCP:  [name], NP Pharmacy:   [facility] [place], [place] - [address] [place] [address] Phone: [phone] Fax: [phone]  [place], [place] - [address] [place] [address] Phone: [phone] Fax: [phone]     Social Determinants of Health (SDOH) Interventions    Readmission Risk Interventions Readmission Risk Prevention Plan 01/17/2020  Transportation Screening Complete  PCP or Specialist Appt within 3-5 Days Not Complete  Not Complete comments plan for SNF  HRI or Home Care Consult Complete  Social Work Consult for [place] Planning/Counseling Complete  Palliative Care Screening Complete  Medication Review (RN Care Manager) Referral to Pharmacy  Some recent data might be hidden

## 2020-02-19 NOTE — Interval H&P Note (Signed)
History and Physical Interval Note:  02/19/2020 11:23 AM  Amanda [name]  has presented today for surgery, with the diagnosis of GI blood loss anemia, history of AVM.  The various methods of treatment have been discussed with the patient and family. After consideration of risks, benefits and other options for treatment, the patient has consented to  Procedure(s): ENTEROSCOPY (N/A) as a surgical intervention.  The patient's history has been reviewed, patient examined, no change in status, stable for surgery.  I have reviewed the patient's chart and labs.  Questions were answered to the patient's satisfaction.     [place]

## 2020-02-20 ENCOUNTER — Inpatient Hospital Stay (HOSPITAL_COMMUNITY): Payer: Medicare HMO

## 2020-02-20 DIAGNOSIS — K922 Gastrointestinal hemorrhage, unspecified: Secondary | ICD-10-CM | POA: Diagnosis not present

## 2020-02-20 LAB — CBC
HCT: 34.1 % — ABNORMAL LOW (ref 36.0–46.0)
Hemoglobin: 10.4 g/dL — ABNORMAL LOW (ref 12.0–15.0)
MCH: 28 pg (ref 26.0–34.0)
MCHC: 30.5 g/dL (ref 30.0–36.0)
MCV: 91.7 fL (ref 80.0–100.0)
Platelets: 247 10*3/uL (ref 150–400)
RBC: 3.72 MIL/uL — ABNORMAL LOW (ref 3.87–5.11)
RDW: 18.6 % — ABNORMAL HIGH (ref 11.5–15.5)
WBC: 18.5 10*3/uL — ABNORMAL HIGH (ref 4.0–10.5)
nRBC: 0.1 % (ref 0.0–0.2)

## 2020-02-20 LAB — GLUCOSE, CAPILLARY
Glucose-Capillary: 108 mg/dL — ABNORMAL HIGH (ref 70–99)
Glucose-Capillary: 117 mg/dL — ABNORMAL HIGH (ref 70–99)
Glucose-Capillary: 138 mg/dL — ABNORMAL HIGH (ref 70–99)
Glucose-Capillary: 158 mg/dL — ABNORMAL HIGH (ref 70–99)
Glucose-Capillary: 169 mg/dL — ABNORMAL HIGH (ref 70–99)

## 2020-02-20 LAB — URINE CULTURE: Culture: >=100000 — AB

## 2020-02-20 LAB — BASIC METABOLIC PANEL
Anion gap: 10 (ref 5–15)
BUN: 12 mg/dL (ref 8–23)
CO2: 24 mmol/L (ref 22–32)
Calcium: 8 mg/dL — ABNORMAL LOW (ref 8.9–10.3)
Chloride: 104 mmol/L (ref 98–111)
Creatinine, Ser: 1.17 mg/dL — ABNORMAL HIGH (ref 0.44–1.00)
GFR calc Af Amer: 51 mL/min — ABNORMAL LOW (ref >60)
GFR calc non Af Amer: 44 mL/min — ABNORMAL LOW (ref >60)
Glucose, Bld: 110 mg/dL — ABNORMAL HIGH (ref 70–99)
Potassium: 3.1 mmol/L — ABNORMAL LOW (ref 3.5–5.1)
Sodium: 138 mmol/L (ref 135–145)

## 2020-02-20 LAB — SURGICAL PATHOLOGY

## 2020-02-20 MED ORDER — FOSFOMYCIN TROMETHAMINE 3 G PO PACK
3.0000 g | PACK | ORAL | Status: DC
  Administered 2020-02-20: 3 g via ORAL
  Filled 2020-02-20: qty 3

## 2020-02-20 MED ORDER — SULFAMETHOXAZOLE-TRIMETHOPRIM 800-160 MG PO TABS
1.0000 | ORAL_TABLET | Freq: Two times a day (BID) | ORAL | Status: DC
  Administered 2020-02-20 – 2020-02-21 (×2): 1 via ORAL
  Filled 2020-02-20 (×2): qty 1

## 2020-02-20 MED ORDER — SODIUM CHLORIDE 0.9 % IV SOLN
2.0000 g | Freq: Two times a day (BID) | INTRAVENOUS | Status: DC
  Administered 2020-02-20: 2 g via INTRAVENOUS
  Filled 2020-02-20 (×2): qty 2

## 2020-02-20 MED ORDER — INSULIN ASPART 100 UNIT/ML ~~LOC~~ SOLN: 0.0000 [IU] | Freq: Three times a day (TID) | SUBCUTANEOUS | Status: DC

## 2020-02-20 MED ORDER — APIXABAN 5 MG PO TABS
5.0000 mg | ORAL_TABLET | Freq: Two times a day (BID) | ORAL | Status: DC
  Administered 2020-02-20 (×2): 5 mg via ORAL
  Filled 2020-02-20 (×2): qty 1

## 2020-02-20 MED ORDER — IOHEXOL 350 MG/ML SOLN
80.0000 mL | Freq: Once | INTRAVENOUS | Status: AC | PRN
  Administered 2020-02-20: 80 mL via INTRAVENOUS

## 2020-02-20 MED ORDER — POTASSIUM CHLORIDE CRYS ER 20 MEQ PO TBCR
40.0000 meq | EXTENDED_RELEASE_TABLET | ORAL | Status: AC
  Administered 2020-02-20 (×2): 40 meq via ORAL
  Filled 2020-02-20 (×2): qty 2

## 2020-02-20 NOTE — Progress Notes (Signed)
PROGRESS NOTE    Amanda [name]  YTK:[id number] DOB: 03-14-1941 DOA: 02/17/2020 PCP: [name], NP     Brief Narrative:  Amanda [name] is an 79 y.o.femalewithhistory of CAD, myeloproliferative disorder, diabetes mellitus type 2, chronic kidney disease stage III, portal vein thrombosis on Eliquis, cirrhosis of the liver and recent admission for GI bleed, failure to thrive who underwent EGD which showed angiodysplasia underwent argon plasma coagulation and clip placement on January 21, 2020. Patient presented from rehab with chief complaint of becoming increasingly weak and has started having black stools again.  Patient's hemoglobin on arrival was 6.1. GI, Dr. Lyndel Safe was notified. Patient was admitted and initiated 3 units of PRBC transfusion. During hospitalization, she was also diagnosed with UTI and started on IV antibiotics. S/p EGD 5/4.   New events last 24 hours / Subjective: Feeling well this morning without any complaints.  She denies any dysuria prior to admission.  Denies any further bleeding episodes.  Assessment & Plan:   Principal Problem:   Acute GI bleeding Active Problems:   [name] (HCC)   Essential hypertension   Chronic diastolic CHF (congestive heart failure) (HCC)   Coronary artery disease involving native coronary artery   Hepatic cirrhosis (HCC)   Type 2 diabetes mellitus with diabetic polyneuropathy (HCC)   AKI (acute kidney injury) ([facility])   History of CVA (cerebrovascular accident)   CKD (chronic kidney disease), stage III   Portal vein thrombosis   AVM (arteriovenous malformation) of small bowel, acquired with hemorrhage   Gastritis and gastroduodenitis   Acute upper GI bleed with symptomatic blood loss anemia -GI consulted -Status post 3 unit packed red blood cell transfusion 5/3 -Status post EGD 5/4 which found for nonbleeding angiectasia is in the distal duodenum and jejunum, treated with APC -Hemoglobin remained stable this morning -Resume  Eliquis -Protonix   History of cirrhosis with portal vein thrombosis -Resume Eliquis  AKI on CKD stage IIIb -Baseline creatinine 1-1.1  -Resolved   UTI, present on admission -Urine culture positive for kluyvera ascorbata, pseudomonas aeruginosa -Cipro discontinued due to QTC prolongation -Finish 5-day course with fosfomycin, Bactrim due to her immunocompromise status  Myeloproliferative disorder -With chronic leukocytosis -Follows with Dr. [name]  Diabetes mellitus type 2 -Sliding scale insulin  Hypokalemia -Replace, trend   DVT prophylaxis: Resume eliquis  Code Status: DNR Family Communication: No family at bedside Disposition Plan:   Status is: Inpatient  Remains inpatient appropriate because:Unsafe d/c plan   Dispo: The patient is from: SNF              Anticipated d/c is to: SNF              Anticipated d/c date is: 1 day              Patient currently is medically stable to d/c.  Awaiting SNF placement.  De-escalate antibiotics to p.o. Resume Eliquis today and monitor CBC.    Consultants:   GI   Antimicrobials:  Anti-infectives (From admission, onward)   Start     Dose/Rate Route Frequency Ordered Stop   02/20/20 2200  sulfamethoxazole-trimethoprim (BACTRIM DS) 800-160 MG per tablet 1 tablet     1 tablet Oral Every 12 hours 02/20/20 1115 02/23/20 2359   02/20/20 1800  fosfomycin (MONUROL) packet 3 g     3 g Oral Every 48 hours 02/20/20 1115 02/24/20 1759   02/20/20 1030  cefTAZidime (FORTAZ) 2 g in sodium chloride 0.9 % 100 mL IVPB  Status:  Discontinued     2 g 200 mL/hr over 30 Minutes Intravenous Every 12 hours 02/20/20 1014 02/20/20 1115   02/19/20 1400  ceftAZIDime (FORTAZ) 2 gram/50 mL D5W IVPB - DUPLEX  Status:  Discontinued     2 g 100 mL/hr over 30 Minutes Intravenous Every 24 hours 02/19/20 1320 02/19/20 1343   02/19/20 1400  cefTAZidime (FORTAZ) 2 g in dextrose 5 % 50 mL IVPB  Status:  Discontinued     2 g 100 mL/hr over 30 Minutes  Intravenous Every 8 hours 02/19/20 1343 02/19/20 1343   02/19/20 1345  cefTAZidime (FORTAZ) 2 g in sodium chloride 0.9 % 100 mL IVPB  Status:  Discontinued     2 g 200 mL/hr over 30 Minutes Intravenous Every 24 hours 02/19/20 1344 02/20/20 1014   02/18/20 1600  cefTRIAXone (ROCEPHIN) 1 g in sodium chloride 0.9 % 100 mL IVPB  Status:  Discontinued     1 g 200 mL/hr over 30 Minutes Intravenous Every 24 hours 02/18/20 1508 02/19/20 1320   02/18/20 1200  ciprofloxacin (CIPRO) IVPB 400 mg  Status:  Discontinued     400 mg 200 mL/hr over 60 Minutes Intravenous Every 12 hours 02/18/20 0058 02/18/20 1508   02/18/20 0100  ciprofloxacin (CIPRO) IVPB 400 mg     400 mg 200 mL/hr over 60 Minutes Intravenous  Once 02/18/20 0058 02/18/20 0414        Objective: Vitals:   02/20/20 0000 02/20/20 0312 02/20/20 0804 02/20/20 0835  BP: (!) 99/56  (!) 98/39 (!) 116/46  Pulse:    87  Resp: 17   16  Temp:  97.8 F (36.6 C)    TempSrc:  Oral    SpO2:    94%  Weight:      Height:        Intake/Output Summary (Last 24 hours) at 02/20/2020 1200 Last data filed at 02/19/2020 1203 Gross per 24 hour  Intake --  Output 0 ml  Net 0 ml   Filed Weights   02/17/20 1635  Weight: 86.2 kg    Examination:  General exam: Appears calm and comfortable  Respiratory system: Clear to auscultation. Respiratory effort normal. No respiratory distress. No conversational dyspnea.  Cardiovascular system: S1 & S2 heard, RRR. No murmurs. No pedal edema. Gastrointestinal system: Abdomen is nondistended, soft and nontender. Normal bowel sounds heard. Central nervous system: Alert and oriented. No focal neurological deficits. Speech clear.  Extremities: Symmetric in appearance  Skin: No rashes, lesions or ulcers on exposed skin  Psychiatry: Judgement and insight appear normal. Mood & affect appropriate.   Data Reviewed: I have personally reviewed following labs and imaging studies  CBC: Recent Labs  Lab 02/16/20 1350  02/16/20 1350 02/17/20 1821 02/17/20 1821 02/17/20 2249 02/18/20 0742 02/18/20 1653 02/19/20 0232 02/20/20 0226  WBC 29.9*  --  34.1*  --  18.9*  --   --  16.1* 18.5*  NEUTROABS  --   --  27.4*  --  14.5*  --   --   --   --   HGB 7.9*   < > 7.6*   < > 6.1* 8.8* 12.7 11.0* 10.4*  HCT 30.8*   < > 27.2*   < > 21.9* 29.7* 39.6 35.0* 34.1*  MCV 104.8*  --  93.8  --  94.0  --   --  89.7 91.7  PLT 333  --  402*  --  244  --   --  245 247   < > =  values in this interval not displayed.   Basic Metabolic Panel: Recent Labs  Lab 02/16/20 1350 02/17/20 1821 02/19/20 0232 02/19/20 0905 02/20/20 0226  NA 139 136 140  --  138  K 2.6* 2.9* 2.9* 3.4* 3.1*  CL 101 99 105  --  104  CO2 22 23 25   --  24  GLUCOSE 131* 139* 95  --  110*  BUN 19 19 15   --  12  CREATININE 1.34* 1.49* 1.19*  --  1.17*  CALCIUM 8.3* 8.2* 8.0*  --  8.0*  MG 1.6*  --   --  1.5*  --    GFR: Estimated Creatinine Clearance: 40.7 mL/min (A) (by C-G formula based on SCr of 1.17 mg/dL (H)). Liver Function Tests: Recent Labs  Lab 02/16/20 1350 02/17/20 1821  AST 36 39  ALT 11 12  ALKPHOS 101 101  BILITOT 2.0* 1.6*  PROT 5.1* 5.1*  ALBUMIN 2.6* 2.6*   No results for input(s): LIPASE, AMYLASE in the last 168 hours. Recent Labs  Lab 02/17/20 1821  AMMONIA 28   Coagulation Profile: Recent Labs  Lab 02/16/20 1350  INR 3.4*   Cardiac Enzymes: No results for input(s): CKTOTAL, CKMB, CKMBINDEX, TROPONINI in the last 168 hours. BNP (last 3 results) No results for input(s): PROBNP in the last 8760 hours. HbA1C: No results for input(s): HGBA1C in the last 72 hours. CBG: Recent Labs  Lab 02/19/20 1631 02/19/20 1921 02/19/20 2303 02/20/20 0310 02/20/20 0804  GLUCAP 124* 183* 117* 117* 108*   Lipid Profile: No results for input(s): CHOL, HDL, LDLCALC, TRIG, CHOLHDL, LDLDIRECT in the last 72 hours. Thyroid Function Tests: No results for input(s): TSH, T4TOTAL, FREET4, T3FREE, THYROIDAB in the last 72  hours. Anemia Panel: No results for input(s): VITAMINB12, FOLATE, FERRITIN, TIBC, IRON, RETICCTPCT in the last 72 hours. Sepsis Labs: No results for input(s): PROCALCITON, LATICACIDVEN in the last 168 hours.  Recent Results (from the past 240 hour(s))  Respiratory Panel by RT PCR (Flu A&B, Covid) - Nasopharyngeal Swab     Status: None   Collection Time: 02/17/20  9:41 PM   Specimen: Nasopharyngeal Swab  Result Value Ref Range Status   SARS Coronavirus 2 by RT PCR NEGATIVE NEGATIVE Final    Comment: (NOTE) SARS-CoV-2 target nucleic acids are NOT DETECTED. The SARS-CoV-2 RNA is generally detectable in upper respiratoy specimens during the acute phase of infection. The lowest concentration of SARS-CoV-2 viral copies this assay can detect is 131 copies/mL. A negative result does not preclude SARS-Cov-2 infection and should not be used as the sole basis for treatment or other patient management decisions. A negative result may occur with  improper specimen collection/handling, submission of specimen other than nasopharyngeal swab, presence of viral mutation(s) within the areas targeted by this assay, and inadequate number of viral copies (<131 copies/mL). A negative result must be combined with clinical observations, patient history, and epidemiological information. The expected result is Negative. Fact Sheet for Patients:  PinkCheek.be Fact Sheet for Healthcare Providers:  GravelBags.it This test is not yet ap proved or cleared by the [place] FDA and  has been authorized for detection and/or diagnosis of SARS-CoV-2 by FDA under an Emergency Use Authorization (EUA). This EUA will remain  in effect (meaning this test can be used) for the duration of the COVID-19 declaration under Section 564(b)(1) of the Act, 21 U.S.C. section 360bbb-3(b)(1), unless the authorization is terminated or revoked sooner.    Influenza A by PCR  NEGATIVE NEGATIVE Final  Influenza B by PCR NEGATIVE NEGATIVE Final    Comment: (NOTE) The Xpert Xpress SARS-CoV-2/FLU/RSV assay is intended as an aid in  the diagnosis of influenza from Nasopharyngeal swab specimens and  should not be used as a sole basis for treatment. Nasal washings and  aspirates are unacceptable for Xpert Xpress SARS-CoV-2/FLU/RSV  testing. Fact Sheet for Patients: PinkCheek.be Fact Sheet for Healthcare Providers: GravelBags.it This test is not yet approved or cleared by the [place] FDA and  has been authorized for detection and/or diagnosis of SARS-CoV-2 by  FDA under an Emergency Use Authorization (EUA). This EUA will remain  in effect (meaning this test can be used) for the duration of the  Covid-19 declaration under Section 564(b)(1) of the Act, 21  U.S.C. section 360bbb-3(b)(1), unless the authorization is  terminated or revoked. Performed at [facility], [place] [address], [place], [place] [address]   Urine culture     Status: Abnormal   Collection Time: 02/18/20  3:16 AM   Specimen: Urine, Random  Result Value Ref Range Status   Specimen Description URINE, RANDOM  Final   Special Requests NONE  Final   Culture (A)  Final    >=100,000 COLONIES/mL KLUYVERA ASCORBATA >=100,000 COLONIES/mL PSEUDOMONAS AERUGINOSA    Report Status 02/20/2020 FINAL  Final   Organism ID, Bacteria KLUYVERA ASCORBATA (A)  Final   Organism ID, Bacteria PSEUDOMONAS AERUGINOSA (A)  Final      Susceptibility   Kluyvera ascorbata - MIC*    AMPICILLIN >=32 RESISTANT Resistant     CEFAZOLIN >=64 RESISTANT Resistant     CEFEPIME <=1 SENSITIVE Sensitive     CEFTAZIDIME <=1 SENSITIVE Sensitive     CEFTRIAXONE <=1 SENSITIVE Sensitive     CIPROFLOXACIN <=0.25 SENSITIVE Sensitive     GENTAMICIN <=1 SENSITIVE Sensitive     IMIPENEM <=0.25 SENSITIVE Sensitive     NITROFURANTOIN 32 SENSITIVE Sensitive      TRIMETH/SULFA <=20 SENSITIVE Sensitive     AMPICILLIN/SULBACTAM <=2 SENSITIVE Sensitive     PIP/TAZO Value in next row Sensitive      <=4 SENSITIVEPerformed at [facility] [address] [place], [place] [address]    * >=100,000 COLONIES/mL KLUYVERA ASCORBATA   Pseudomonas aeruginosa - MIC*    CEFTAZIDIME Value in next row Sensitive      <=4 SENSITIVEPerformed at [facility] [address] [place], [place] [address]    CIPROFLOXACIN Value in next row Sensitive      <=4 SENSITIVEPerformed at [facility] [address], [place], [place] [address]    GENTAMICIN Value in next row Sensitive      <=4 SENSITIVEPerformed at [facility] [address], [place], [place] [address]    IMIPENEM Value in next row Sensitive      <=4 SENSITIVEPerformed at [facility] [address], [place], [place] [address]    PIP/TAZO Value in next row Sensitive      <=4 SENSITIVEPerformed at [facility] [address] [place], [place] [address]    CEFEPIME Value in next row Sensitive      <=4 SENSITIVEPerformed at [facility] [place] [address] [place], [place] [address]    * >=100,000 COLONIES/mL PSEUDOMONAS AERUGINOSA      Radiology Studies: No results found.    Scheduled Meds:  apixaban  5 mg Oral BID   Chlorhexidine Gluconate Cloth  6 each Topical Daily   fosfomycin  3  g Oral Q48H   insulin aspart  0-6 Units Subcutaneous Q4H   lactobacillus acidophilus  2 tablet Oral TID   latanoprost  1 drop Both Eyes QHS   pantoprazole  40 mg Oral BID   potassium chloride  40 mEq Oral Q4H   sulfamethoxazole-trimethoprim  1 tablet Oral Q12H   timolol  1 drop Both Eyes Daily   Continuous Infusions:   LOS: 3 days      Time spent: 35 minutes   Dessa Phi, DO [facility] 02/20/2020, 12:00 PM   Available via Epic secure chat 7am-7pm After these hours, please refer to coverage provider listed on [email or web address]

## 2020-02-20 NOTE — TOC Progression Note (Addendum)
Transition of Care [facility]) - Progression Note    Patient Details  Name: Amanda [name] MRN: [id number] Date of Birth: [birth year]  Transition of Care [facility]) CM/SW [place], [place] Phone Number: 02/20/2020, 10:02 AM  Clinical Narrative:     CSW confirmed with husband preference for [place]. Husband confirmed that pt. Was at [facility] for [facility] days recently. CSW informs husband of potential copay; he was upset and plans to follow up with insurance company. Still okay to move forward with [facility]. FL2 complete and request sent to [facility].   1241: Kenisha at [facility] confirmed acceptance of pt. CSW called [employer] and Everlene Balls does not manage so Irine Seal will start auth.   Expected Discharge Plan: Skilled Nursing Facility Barriers to Discharge: Ship broker, Continued Medical Work up  Expected Discharge Plan and Services Expected Discharge Plan: [place]       Living arrangements for the past 2 months: Single Family Home                                       Social Determinants of Health (SDOH) Interventions    Readmission Risk Interventions Readmission Risk Prevention Plan 01/17/2020  Transportation Screening Complete  PCP or Specialist Appt within 3-5 Days Not Complete  Not Complete comments plan for SNF  HRI or [place] Complete  Social Work Consult for [place] Planning/Counseling Complete  Palliative Care Screening Complete  Medication Review Press photographer) Referral to Pharmacy  Some recent data might be hidden

## 2020-02-20 NOTE — Progress Notes (Signed)
Daily Rounding Note  02/20/2020, 12:02 PM  LOS: 3 days   SUBJECTIVE:   Chief complaint:  Gi bleed, blood loss anemia     She feels well.  She is tolerating solid food.  No nausea, vomiting, abdominal pain.  Bowel movement yesterday, and just now.  Not clear if this is dark or bloody.  OBJECTIVE:         Vital signs in last 24 hours:    Temp:  [97.4 F (36.3 C)-98.3 F (36.8 C)] 97.8 F (36.6 C) (05/05 0312) Pulse Rate:  [75-87] 87 (05/05 0835) Resp:  [16-20] 16 (05/05 0835) BP: (93-134)/(39-67) 116/46 (05/05 0835) SpO2:  [93 %-100 %] 94 % (05/05 0835) Last BM Date: 02/18/20 Filed Weights   02/17/20 1635  Weight: 86.2 kg   General: Looks chronically ill, tired but alert Heart: RRR. Chest: No labored breathing. Abdomen: Obese, soft, active bowel sounds.  Not tender. Extremities: LE edema Neuro/Psych: Cooperative, pleasant.  Less anxious than yesterday, seems resigned to being in the hospital as long as necessary.  Intake/Output from previous day: No intake/output data recorded.  Intake/Output this shift: No intake/output data recorded.  Lab Results: Recent Labs    02/17/20 2249 02/18/20 0742 02/18/20 1653 02/19/20 0232 02/20/20 0226  WBC 18.9*  --   --  16.1* 18.5*  HGB 6.1*   < > 12.7 11.0* 10.4*  HCT 21.9*   < > 39.6 35.0* 34.1*  PLT 244  --   --  245 247   < > = values in this interval not displayed.   BMET Recent Labs    02/17/20 1821 02/17/20 1821 02/19/20 0232 02/19/20 0905 02/20/20 0226  NA 136  --  140  --  138  K 2.9*   < > 2.9* 3.4* 3.1*  CL 99  --  105  --  104  CO2 23  --  25  --  24  GLUCOSE 139*  --  95  --  110*  BUN 19  --  15  --  12  CREATININE 1.49*  --  1.19*  --  1.17*  CALCIUM 8.2*  --  8.0*  --  8.0*   < > = values in this interval not displayed.   LFT Recent Labs    02/17/20 1821  PROT 5.1*  ALBUMIN 2.6*  AST 39  ALT 12  ALKPHOS 101  BILITOT 1.6*  BILIDIR  0.5*  IBILI 1.1*     ASSESMENT:   *   GI bleed.  Recurrent.   02/19/2020 small bowel enteroscopy: Tortuous esophagus, biopsy of gastritis.  Endoclips in duodenum from the 01/21/2020 endoscopy.  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  No active bleeding.  *    NASH cirrhosis.  *   Blood loss anemia.  Myeloproliferative disorder. Hgb 6.1 >> 10.4.  3 PRBCs thus far on 5/3.    *    Chronic Eliquis, on hold.  Indication was stable,, as of 01/13/2020, nonocclusive main PV thrombus.  Not clear that it is essential.  *    Hypokalemia.   PLAN   *    Await pathology from gastric biopsies, do not expect this to return until the soonest 5/6 afternoon.   Path of stomach 01/21/20: reactive gastropathy, no H pylori.    *   Need to reassess need for Eliquis, would hematology input be helpful in this regards?   GI preference is  for no Eliquis.    Amanda [name]  02/20/2020, 12:02 PM Phone [phone].Marland Kitchen  Need to recheck Dr. [name]?  Or maybe go with gave on Friday

## 2020-02-20 NOTE — Evaluation (Signed)
Occupational Therapy Evaluation Patient Details Name: Amanda [name] MRN: [id number] DOB: [birth year] Today's Date: 02/20/2020    History of Present Illness 79 y.o. 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 Pt d/c to SNF, had ED admission on 5/1 after SNF d/c for low Hgb and then was d/c. Pt back to ED from home on 5/2 for weakness, inability to mobilize. s/p Small bowel enteroscopy on 5/4 with reaveals gastritis and no active bleeding found.   Clinical Impression   Pt presents with decline in function and safety with ADLs and ADL mobility with impaired strength, balance and endurance. Pt lives at home with her husband and was recently d/c from a SNF for rehab to home. Pt currently requires max - mod A with bed mobility to sit EOB, min guard A with UB ADLs, max - total A with LB ADLs, mod A with transfers, total A with toileting. Pt would benefit from acute OT services to address impairments to maximize level of function and safety    Follow Up Recommendations  SNF    Equipment Recommendations  Other (comment)(TBD at SNF)    Recommendations for Other Services       Precautions / Restrictions Precautions Precautions: Fall Precaution Comments: fear of falling Restrictions Weight Bearing Restrictions: No      Mobility Bed Mobility Overal bed mobility: Needs Assistance Bed Mobility: Rolling;Sidelying to Sit;Sit to Supine Rolling: Mod assist Sidelying to sit: Mod assist;HOB elevated   Sit to supine: Max assist;HOB elevated   General bed mobility comments: mod A to bring LEs to EOB and elevate trunk, max A with LEs back onto bed  Transfers Overall transfer level: Needs assistance Equipment used:  Rolling walker (2 wheeled) Transfers: Sit to/from Stand Sit to Stand: Mod assist;From elevated surface         General transfer comment: Mod assist for power up, steadying. Increased time to rise to upright, verbal cues for correct hand placement when rising. Pt able to sid estep 5 steps to [facility], unsteady, anxious and fearful    Balance Overall balance assessment: Needs assistance;History of Falls Sitting-balance support: Feet supported;Bilateral upper extremity supported Sitting balance-Leahy Scale: Fair     Standing balance support: Bilateral upper extremity supported;During functional activity Standing balance-Leahy Scale: Poor                             ADL either performed or assessed with clinical judgement   ADL Overall ADL's : Needs assistance/impaired Eating/Feeding: Set up;Supervision/ safety;Sitting;Bed level   Grooming: Wash/dry hands;Wash/dry face;Min guard;Sitting   Upper Body Bathing: Min guard;Sitting   Lower Body Bathing: Maximal assistance   Upper Body Dressing : Min guard;Sitting   Lower Body Dressing: Total assistance   Toilet Transfer: Moderate assistance;RW;Ambulation;Cueing for safety;Cueing for sequencing   Toileting- Clothing Manipulation and Hygiene: Total assistance       Functional mobility during ADLs: Moderate assistance;Rolling walker;Cueing for safety;Cueing for sequencing       Vision Patient Visual Report: No change from baseline       Perception     Praxis      Pertinent Vitals/Pain Pain Assessment: No/denies pain     Hand Dominance Right   Extremity/Trunk Assessment Upper Extremity  Assessment Upper Extremity Assessment: Generalized weakness   Lower Extremity Assessment Lower Extremity Assessment: Defer to PT evaluation   Cervical / Trunk Assessment Cervical / Trunk Assessment: Normal   Communication Communication Communication: No difficulties   Cognition Arousal/Alertness: Awake/alert Behavior  During Therapy: WFL for tasks assessed/performed;Anxious Overall Cognitive Status: Within Functional Limits for tasks assessed                                 General Comments: hard of hearing, but A&Ox4 and interactive. Very anxious about standing due to fear of falling.   General Comments       Exercises     Shoulder Instructions      Home Living Family/patient expects to be discharged to:: Skilled nursing facility Living Arrangements: Spouse/significant other                               Additional Comments: Lives in apt, able to ambulate transfer with RW, WC, RW, commode prior to last visit.       Prior Functioning/Environment Level of [place]: Needs assistance  Gait / Transfers Assistance Needed: At rehab facility, pt ambulating short distances with chair follow. PTA while at home for 1 day, pt unable to transfer or get out of bed and needed neighbor assist just to get out of her car to the wheelchair upon d/c from hospital. ADL's / Homemaking Assistance Needed: When asked who cooks, cleans, dresses pt, and bathes pt, pt reponds by pointing to husband   Comments: has RW, rollator, w/c, and BSC at home. Per husband, pt has been using w/c for longer distance mobility for quite some time but up until last hospitalization, pt was able to transfer with standby assist        OT Problem List: Decreased strength;Impaired balance (sitting and/or standing);Decreased safety awareness;Decreased activity tolerance;Decreased knowledge of use of DME or AE      OT Treatment/Interventions: Self-care/ADL training;Therapeutic exercise;Patient/family education;Therapeutic activities;DME and/or AE instruction    OT Goals(Current goals can be found in the care plan section) Acute Rehab OT Goals Patient Stated Goal: get back to walking OT Goal Formulation: With patient/family Time For Goal Achievement: 03/05/20 ADL Goals Pt Will Perform Grooming: with  set-up;sitting Pt Will Perform Upper Body Bathing: with supervision;with set-up;sitting Pt Will Perform Lower Body Bathing: with mod assist;sitting/lateral leans Pt Will Perform Upper Body Dressing: with supervision;with set-up;sitting Pt Will Transfer to Toilet: with min assist;stand pivot transfer  OT Frequency: Min 2X/week   Barriers to D/C:            Co-evaluation              AM-PAC OT "6 Clicks" Daily Activity     Outcome Measure Help from another person eating meals?: A Little Help from another person taking care of personal grooming?: A Little Help from another person toileting, which includes using toliet, bedpan, or urinal?: Total Help from another person bathing (including washing, rinsing, drying)?: A Lot Help from another person to put on and taking off regular upper body clothing?: A Little Help from another person to put on and taking off regular lower body clothing?: Total 6 Click Score: 13   End of Session Equipment Utilized During Treatment: Gait belt;Rolling walker  Activity Tolerance: Patient limited by fatigue Patient left: in bed;with call bell/phone within reach;with family/visitor present  OT Visit Diagnosis: Unsteadiness on  feet (R26.81);Other abnormalities of gait and mobility (R26.89);History of falling (Z91.81);Muscle weakness (generalized) (M62.81)                Time: 7837-5423 OT Time Calculation (min): 23 min Charges:  OT General Charges $OT Visit: 1 Visit OT Evaluation $OT Eval Moderate Complexity: 1 Mod    Britt Bottom 02/20/2020, 3:25 PM

## 2020-02-20 NOTE — Progress Notes (Signed)
PHARMACY NOTE:  ANTIMICROBIAL RENAL DOSAGE ADJUSTMENT  Current antimicrobial regimen includes a mismatch between antimicrobial dosage and estimated renal function.  As per policy approved by the Pharmacy & Therapeutics and Medical Executive Committees, the antimicrobial dosage will be adjusted accordingly.  Current antimicrobial dosage:  Ceftazidime 2g IV every 24 hours  Indication: Pseudomonas + Kluyvera UTI  Renal Function:  Estimated Creatinine Clearance: 40.7 mL/min (A) (by C-G formula based on SCr of 1.17 mg/dL (H)). []      On intermittent HD, scheduled: []      On CRRT    Antimicrobial dosage has been changed to:  Ceftazidime 2g IV every 12 hours  Additional comments: Oral options have been discussed with the MD. Avoiding fluoroquinolones with prolonged QTc.    Thank you for allowing pharmacy to be a part of this patient's care.  Alycia Rossetti, PharmD, BCPS Clinical Pharmacist Clinical phone for 02/20/2020: [phone] 02/20/2020 10:15 AM   **Pharmacist phone directory can now be found on [email or web address] (PW [email or web address]).  Listed under [place].

## 2020-02-20 NOTE — NC FL2 (Signed)
[place] LEVEL OF CARE SCREENING TOOL     IDENTIFICATION  Patient Name: Amanda [name] Birthdate: [birth year] Sex: female Admission Date (Current Location): 02/17/2020  [facility] and [place] Number:  Herbalist and Address:  [facility] [place], [place] [address] [place], [place] [address]      Provider Number: [id number]  Attending Physician Name and Address:  Dessa Phi, DO  Relative Name and Phone Number:  [place] Sorn    Current Level of Care: Hospital Recommended Level of Care: [place] Prior Approval Number:    Date Approved/Denied:   PASRR Number: [id number]  Discharge Plan: SNF    Current Diagnoses: Patient Active Problem List   Diagnosis Date Noted  . Gastritis and gastroduodenitis   . Acute GI bleeding 02/17/2020  . Weakness generalized   . AVM (arteriovenous malformation) of small bowel, acquired with hemorrhage   . Malnutrition of moderate degree 01/16/2020  . Goals of care, counseling/discussion   . Palliative care by specialist   . Nausea & vomiting 01/13/2020  . Failure to thrive in adult 01/13/2020  . Myeloproliferative disorder ([place]) 01/13/2020  . Portal vein thrombosis   . SIRS (systemic inflammatory response syndrome) ([place]) 12/28/2019  . Worsening lower extremity weakness 12/14/2019  . Leg swelling 08/21/2019  . Hypotension due to hypovolemia 08/10/2019  . CKD (chronic kidney disease), stage III 08/10/2019  . Acute blood loss anemia 05/23/2018  . Dehydration 03/22/2018  . AKI (acute kidney injury) ([place]) 03/22/2018  . History of CVA (cerebrovascular accident) 03/22/2018  . Hip injury, left, subsequent encounter 11/08/2017  . Iron deficiency anemia 12/22/2016  . OSA (obstructive sleep apnea) 04/21/2016  . Osteopenia 04/07/2016  . Lung nodule, solitary 02/05/2016  . Aortic dilatation ([place]) 02/05/2016  . Dyspnea 01/28/2016  . Cirrhosis ([place]) 01/28/2016  . Allergic rhinitis 01/23/2016  .  Hepatic cirrhosis ([place]) 10/28/2015  . Irritable bowel syndrome 08/12/2015  . Obesity (BMI 30-39.9) 04/30/2015  . Diabetic retinopathy of both eyes ([place]) 04/18/2015  . Former smoker 04/18/2015  . GAD (generalized anxiety disorder) 04/18/2015  . Status post primary angioplasty with coronary stent 04/18/2015  . Coronary artery disease involving native coronary artery 03/01/2015  . Chronic diastolic CHF (congestive heart failure) ([place]) 02/01/2015  . ST elevation myocardial infarction (STEMI) involving left circumflex coronary artery in recovery phase ([place]) 01/27/2015  . Idioventricular rhythm ([place])   . Diabetic peripheral neuropathy associated with type 2 diabetes mellitus ([place]) 01/23/2015  . Type 2 diabetes mellitus with diabetic polyneuropathy ([place]) 01/23/2015  . Thrombocytosis ([place]) 07/04/2014  . Cholelithiasis 02/07/2014  . Chronic diarrhea 01/29/2014  . Leukocytosis 06/02/2013  . [name] ([place]) 05/31/2013  . Essential hypertension 05/31/2013  . History of Bell's palsy 05/26/2013  . DERMATITIS, ATOPIC 11/16/2010  . DIZZINESS 11/16/2010  . DIASTOLIC DYSFUNCTION 97/98/9211  . Hyperlipidemia 12/15/2009  . Essential hypertension, benign 11/24/2009  . PSORIASIS 11/24/2009  . Depression 09/13/2009  . 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[place]) 07/08/2008    Orientation RESPIRATION BLADDER Height & Weight     Self, Time, Situation, Place  Normal External catheter Weight: 190 lb (86.2 kg) Height:  5' 4"  (162.6 cm)  BEHAVIORAL SYMPTOMS/MOOD NEUROLOGICAL BOWEL NUTRITION STATUS  (none) (none) Continent Diet(see d/c summary)  AMBULATORY STATUS COMMUNICATION OF NEEDS Skin   Extensive Assist Verbally Normal                       Personal Care Assistance Level of  Assistance  Bathing, Feeding, Dressing Bathing Assistance: Maximum assistance Feeding assistance: Independent Dressing Assistance: Maximum assistance     Functional Limitations Info  Sight, Hearing,  Speech Sight Info: Adequate Hearing Info: Adequate Speech Info: Adequate    SPECIAL CARE FACTORS FREQUENCY  PT (By licensed PT), OT (By licensed OT)     PT Frequency: 5x/week OT Frequency: 5x/week            Contractures Contractures Info: Not present    Additional Factors Info  Code Status, Allergies, Insulin Sliding Scale Code Status Info: DNR Allergies Info: Lisinopril, tape, latex, Penicillins, Doxycycline   Insulin Sliding Scale Info: insulin aspart (novoLOG) injection 0-6 Units       Current Medications (02/20/2020):  This is the current hospital active medication list Current Facility-Administered Medications  Medication Dose Route Frequency Provider Last Rate Last Admin  . cefTAZidime (FORTAZ) 2 g in sodium chloride 0.9 % 100 mL IVPB  2 g Intravenous Q24H Shahmehdi, Seyed A, MD 200 mL/hr at 02/19/20 1822 2 g at 02/19/20 1822  . Chlorhexidine Gluconate Cloth 2 % PADS 6 each  6 each Topical Daily Deatra James, MD   6 each at 02/20/20 0847  . insulin aspart (novoLOG) injection 0-6 Units  0-6 Units Subcutaneous Q4H Rise Patience, MD      . lactobacillus acidophilus (BACID) tablet 2 tablet  2 tablet Oral TID Deatra James, MD   2 tablet at 02/20/20 0847  . latanoprost (XALATAN) 0.005 % ophthalmic solution 1 drop  1 drop Both Eyes QHS Rise Patience, MD   1 drop at 02/19/20 2001  . ondansetron (ZOFRAN) tablet 4 mg  4 mg Oral Q6H PRN Rise Patience, MD       Or  . ondansetron [facility]) injection 4 mg  4 mg Intravenous Q6H PRN Rise Patience, MD      . pantoprazole (PROTONIX) EC tablet 40 mg  40 mg Oral BID Vena Rua, PA-C   40 mg at 02/20/20 0836  . potassium chloride SA (KLOR-CON) CR tablet 40 mEq  40 mEq Oral Q4H Dessa Phi, DO   40 mEq at 02/20/20 0836  . timolol (TIMOPTIC) 0.5 % ophthalmic solution 1 drop  1 drop Both Eyes Daily Rise Patience, MD   1 drop at 02/20/20 0848     Discharge Medications: Please see  discharge summary for a list of discharge medications.  Relevant Imaging Results:  Relevant Lab Results:   Additional Information SSN: [id number] [place], [place]

## 2020-02-21 DIAGNOSIS — E118 Type 2 diabetes mellitus with unspecified complications: Secondary | ICD-10-CM | POA: Diagnosis not present

## 2020-02-21 DIAGNOSIS — D6489 Other specified anemias: Secondary | ICD-10-CM | POA: Diagnosis not present

## 2020-02-21 DIAGNOSIS — M2550 Pain in unspecified joint: Secondary | ICD-10-CM | POA: Diagnosis not present

## 2020-02-21 DIAGNOSIS — M6281 Muscle weakness (generalized): Secondary | ICD-10-CM | POA: Diagnosis not present

## 2020-02-21 DIAGNOSIS — K5521 Angiodysplasia of colon with hemorrhage: Secondary | ICD-10-CM | POA: Diagnosis not present

## 2020-02-21 DIAGNOSIS — R2689 Other abnormalities of gait and mobility: Secondary | ICD-10-CM | POA: Diagnosis not present

## 2020-02-21 DIAGNOSIS — R278 Other lack of coordination: Secondary | ICD-10-CM | POA: Diagnosis not present

## 2020-02-21 DIAGNOSIS — N390 Urinary tract infection, site not specified: Secondary | ICD-10-CM | POA: Diagnosis not present

## 2020-02-21 DIAGNOSIS — F29 Unspecified psychosis not due to a substance or known physiological condition: Secondary | ICD-10-CM | POA: Diagnosis not present

## 2020-02-21 DIAGNOSIS — I998 Other disorder of circulatory system: Secondary | ICD-10-CM | POA: Diagnosis not present

## 2020-02-21 DIAGNOSIS — D500 Iron deficiency anemia secondary to blood loss (chronic): Secondary | ICD-10-CM | POA: Diagnosis not present

## 2020-02-21 DIAGNOSIS — I959 Hypotension, unspecified: Secondary | ICD-10-CM | POA: Diagnosis not present

## 2020-02-21 DIAGNOSIS — K7469 Other cirrhosis of liver: Secondary | ICD-10-CM | POA: Diagnosis not present

## 2020-02-21 DIAGNOSIS — D471 Chronic myeloproliferative disease: Secondary | ICD-10-CM | POA: Diagnosis not present

## 2020-02-21 DIAGNOSIS — Z743 Need for continuous supervision: Secondary | ICD-10-CM | POA: Diagnosis not present

## 2020-02-21 DIAGNOSIS — R2681 Unsteadiness on feet: Secondary | ICD-10-CM | POA: Diagnosis not present

## 2020-02-21 DIAGNOSIS — R1312 Dysphagia, oropharyngeal phase: Secondary | ICD-10-CM | POA: Diagnosis not present

## 2020-02-21 DIAGNOSIS — N178 Other acute kidney failure: Secondary | ICD-10-CM | POA: Diagnosis not present

## 2020-02-21 DIAGNOSIS — R41841 Cognitive communication deficit: Secondary | ICD-10-CM | POA: Diagnosis not present

## 2020-02-21 DIAGNOSIS — N183 Chronic kidney disease, stage 3 unspecified: Secondary | ICD-10-CM | POA: Diagnosis not present

## 2020-02-21 DIAGNOSIS — K922 Gastrointestinal hemorrhage, unspecified: Secondary | ICD-10-CM | POA: Diagnosis not present

## 2020-02-21 DIAGNOSIS — I10 Essential (primary) hypertension: Secondary | ICD-10-CM | POA: Diagnosis not present

## 2020-02-21 DIAGNOSIS — I5032 Chronic diastolic (congestive) heart failure: Secondary | ICD-10-CM | POA: Diagnosis not present

## 2020-02-21 DIAGNOSIS — Z7401 Bed confinement status: Secondary | ICD-10-CM | POA: Diagnosis not present

## 2020-02-21 DIAGNOSIS — D649 Anemia, unspecified: Secondary | ICD-10-CM | POA: Diagnosis not present

## 2020-02-21 DIAGNOSIS — I81 Portal vein thrombosis: Secondary | ICD-10-CM | POA: Diagnosis not present

## 2020-02-21 DIAGNOSIS — D45 Polycythemia vera: Secondary | ICD-10-CM | POA: Diagnosis not present

## 2020-02-21 LAB — BASIC METABOLIC PANEL
Anion gap: 7 (ref 5–15)
BUN: 10 mg/dL (ref 8–23)
CO2: 22 mmol/L (ref 22–32)
Calcium: 8 mg/dL — ABNORMAL LOW (ref 8.9–10.3)
Chloride: 107 mmol/L (ref 98–111)
Creatinine, Ser: 1.17 mg/dL — ABNORMAL HIGH (ref 0.44–1.00)
GFR calc Af Amer: 51 mL/min — ABNORMAL LOW (ref >60)
GFR calc non Af Amer: 44 mL/min — ABNORMAL LOW (ref >60)
Glucose, Bld: 132 mg/dL — ABNORMAL HIGH (ref 70–99)
Potassium: 4 mmol/L (ref 3.5–5.1)
Sodium: 136 mmol/L (ref 135–145)

## 2020-02-21 LAB — CBC
HCT: 36.1 % (ref 36.0–46.0)
Hemoglobin: 10.7 g/dL — ABNORMAL LOW (ref 12.0–15.0)
MCH: 27.5 pg (ref 26.0–34.0)
MCHC: 29.6 g/dL — ABNORMAL LOW (ref 30.0–36.0)
MCV: 92.8 fL (ref 80.0–100.0)
Platelets: 222 10*3/uL (ref 150–400)
RBC: 3.89 MIL/uL (ref 3.87–5.11)
RDW: 18.9 % — ABNORMAL HIGH (ref 11.5–15.5)
WBC: 22.8 10*3/uL — ABNORMAL HIGH (ref 4.0–10.5)
nRBC: 0.1 % (ref 0.0–0.2)

## 2020-02-21 LAB — GLUCOSE, CAPILLARY
Glucose-Capillary: 120 mg/dL — ABNORMAL HIGH (ref 70–99)
Glucose-Capillary: 139 mg/dL — ABNORMAL HIGH (ref 70–99)

## 2020-02-21 LAB — MAGNESIUM: Magnesium: 1.9 mg/dL (ref 1.7–2.4)

## 2020-02-21 MED ORDER — FOSFOMYCIN TROMETHAMINE 3 G PO PACK: 3.0000 g | PACK | ORAL | 0 refills | Status: AC

## 2020-02-21 MED ORDER — SULFAMETHOXAZOLE-TRIMETHOPRIM 800-160 MG PO TABS: 1.0000 | ORAL_TABLET | Freq: Two times a day (BID) | ORAL | 0 refills | Status: AC

## 2020-02-21 NOTE — TOC Transition Note (Signed)
Transition of Care [facility]) - CM/SW Discharge Note   Patient Details  Name: Amanda [name] MRN: [id number] Date of Birth: [birth year]  [facility]) CM/SW Contact:  Bethann Berkshire, [place] Phone Number: 02/21/2020, 10:36 AM   Clinical Narrative:     Patient will DC to: [facility] Anticipated DC date: 02/21/20 Family notified:Mayon,[name] (Spouse)  Transport by: Corey Harold   Per MD patient ready for DC to [facility]. RN, patient, patient's family, and facility notified of DC. Discharge Summary and FL2 sent to facility. RN to call report prior to discharge ([phone] Room 102P). DC packet on chart. Ambulance transport requested for patient.   CSW will sign off for now as social work intervention is no longer needed. Please consult [place] again if new needs arise.   Final next level of care: Skilled Nursing Facility Barriers to Discharge: Ship broker, Continued Medical Work up   Patient Goals and CMS Choice   CMS Medicare.gov Compare Post Acute Care list provided to:: Patient Represenative (must comment)([name]) Choice offered to / list presented to : Spouse  Discharge Placement                       Discharge Plan and Services                                     Social Determinants of Health (SDOH) Interventions     Readmission Risk Interventions Readmission Risk Prevention Plan 01/17/2020  Transportation Screening Complete  PCP or Specialist Appt within 3-5 Days Not Complete  Not Complete comments plan for SNF  HRI or [place] Complete  Social Work Consult for [place] Planning/Counseling Complete  Palliative Care Screening Complete  Medication Review Press photographer) Referral to Pharmacy  Some recent data might be hidden

## 2020-02-21 NOTE — Discharge Summary (Signed)
Physician Discharge Summary  Amanda [name]:[id number] DOB: 1940/11/09 DOA: 02/17/2020  PCP: [name], NP  Admit date: 02/17/2020 Discharge date: 02/21/2020  Admitted From: SNF  Disposition:  SNF   Recommendations for Outpatient Follow-up:  1. Follow up with PCP in 1 week 2. Follow up with GI  3. Please obtain CBC in 1 week to follow up on Hgb   Discharge Condition: Stable CODE STATUS: DNR  Diet recommendation:  Diet Orders (From admission, onward)    Start     Ordered   02/19/20 1207  Diet regular Room service appropriate? Yes; Fluid consistency: Thin  Diet effective now    Question Answer Comment  Room service appropriate? Yes   Fluid consistency: Thin      02/19/20 1206         Brief/Interim Summary: Amanda [name] is an 79 y.o.femalewithhistory of CAD, myeloproliferative disorder, diabetes mellitus type 2, chronic kidney disease stage III, portal vein thrombosis on Eliquis, cirrhosis of the liver and recent admission for GI bleed, failure to thrive who underwent EGD which showed angiodysplasia underwent argon plasma coagulation and clip placement on January 21, 2020. Patient presented from rehab with chief complaint of becoming increasingly weak and has started having black stools again. Patient's hemoglobin on arrival was 6.1. GI, Dr. Lyndel Safe was notified. Patient was admitted and initiated 3 units of PRBC transfusion. During hospitalization, she was also diagnosed with UTI and started on IV antibiotics. S/p EGD 5/4 which found for nonbleeding angiectasia is in the distal duodenum and jejunum, treated with APC. She had a repeat imaging for portal vein thrombosis which revealed nearly resolved thrombosis. Eliquis was subsequently stopped.   Discharge Diagnoses:  Principal Problem:   Acute GI bleeding Active Problems:   [name] (HCC)   Essential hypertension   Chronic diastolic CHF (congestive heart failure) (HCC)   Coronary artery disease involving native  coronary artery   Hepatic cirrhosis (HCC)   Type 2 diabetes mellitus with diabetic polyneuropathy (HCC)   AKI (acute kidney injury) ([place])   History of CVA (cerebrovascular accident)   CKD (chronic kidney disease), stage III   Portal vein thrombosis   AVM (arteriovenous malformation) of small bowel, acquired with hemorrhage   Gastritis and gastroduodenitis   Acute upper GI bleed with symptomatic blood loss anemia -GI consulted -Status post 3 unit packed red blood cell transfusion 5/3 -Status post EGD 5/4 which found for nonbleeding angiectasia is in the distal duodenum and jejunum, treated with APC -Hemoglobin remained stable, 10.7  -Protonix   History of cirrhosis with portal vein thrombosis -Repeat CTA abdomen showed near complete resolution of the previously demonstrated portal vein thrombosis. Mild residual thrombus is noted at the bifurcation. No new thrombus was identified on today's exam. -Eliquis stopped   AKI on CKD stage IIIb -Baseline creatinine 1-1.1  -Resolved   UTI, present on admission -Urine culture positive for kluyvera ascorbata, pseudomonas aeruginosa -Cipro discontinued due to QTC prolongation -Finish 5-day course with fosfomycin, Bactrim due to her immunocompromise status  Myeloproliferative disorder -With chronic leukocytosis -Follows with Dr. [name]  Diabetes mellitus type 2 -Sliding scale insulin during hospital stay     Discharge Instructions  Discharge Instructions    Increase activity slowly   Complete by: As directed      Allergies as of 02/21/2020      Reactions   Doxycycline Hives, Swelling, Rash   Penicillins Swelling   Has patient had a PCN reaction causing immediate rash, facial/tongue/throat swelling, SOB or lightheadedness  with hypotension: No Has patient had a PCN reaction causing severe rash involving mucus membranes or skin necrosis: No Has patient had a PCN reaction that required hospitalization: No Has patient had a PCN  reaction occurring within the last 10 years: No If all of the above answers are "NO", then may proceed with Cephalosporin use.   Lisinopril Cough   Tape Hives   Latex Hives, Itching, Rash      Medication List    STOP taking these medications   apixaban 5 MG Tabs tablet Commonly known as: ELIQUIS   dronabinol 2.5 MG capsule Commonly known as: MARINOL   feeding supplement (ENSURE ENLIVE) Liqd   potassium chloride SA 20 MEQ tablet Commonly known as: KLOR-CON     TAKE these medications   Accu-Chek Guide w/Device Kit 1 kit by Does not apply route as directed.   acetaminophen 325 MG tablet Commonly known as: TYLENOL Take 2 tablets (650 mg total) by mouth every 6 (six) hours as needed for mild pain (or Fever >/= 101).   ezetimibe 10 MG tablet Commonly known as: ZETIA Take 1 tablet (10 mg total) by mouth daily.   fosfomycin 3 g Pack Commonly known as: MONUROL Take 3 g by mouth every other day for 2 days. Start taking on: Feb 22, 2020   furosemide 40 MG tablet Commonly known as: LASIX TAKE 1 TABLET(40 MG) BY MOUTH DAILY What changed: See the new instructions.   glucose blood test strip Commonly known as: Accu-Chek Guide Use to check blood sugars 3 times daily   OneTouch Verio test strip Generic drug: glucose blood USE TO TEST BLOOD SUGAR THREE TIMES DAILY   insulin aspart 100 UNIT/ML injection Commonly known as: novoLOG Inject 0-15 Units into the skin 3 (three) times daily with meals. CBG 70 - 120: 0 units CBG 121 - 150: 2 units CBG 151 - 200: 3 units CBG 201 - 250: 5 units CBG 251 - 300: 8 units CBG 301 - 350: 11 units CBG 351 - 400: 15 units What changed:   how much to take  when to take this  additional instructions   Jakafi 25 MG tablet Generic drug: ruxolitinib phosphate Take 25 mg by mouth 2 (two) times daily.   lactulose 10 GM/15ML solution Commonly known as: CHRONULAC Take 30 mLs (20 g total) by mouth 2 (two) times daily.   latanoprost 0.005  % ophthalmic solution Commonly known as: XALATAN Place 1 drop into both eyes at bedtime.   lidocaine-prilocaine cream Commonly known as: EMLA Apply 1 application topically as needed. Please apply EMLA over the Port-A-Cath site 1 hour prior to coming to our office. What changed:   when to take this  reasons to take this  additional instructions   mirtazapine 15 MG tablet Commonly known as: REMERON Take 1 tablet (15 mg total) by mouth at bedtime.   pantoprazole 40 MG tablet Commonly known as: PROTONIX Take 1 tablet (40 mg total) by mouth 2 (two) times daily before a meal.   spironolactone 25 MG tablet Commonly known as: ALDACTONE Take 0.5 tablets (12.5 mg total) by mouth daily.   sulfamethoxazole-trimethoprim 800-160 MG tablet Commonly known as: BACTRIM DS Take 1 tablet by mouth every 12 (twelve) hours for 2 days.   timolol 0.5 % ophthalmic solution Commonly known as: TIMOPTIC Place 1 drop into both eyes daily.   triamcinolone cream 0.1 % Commonly known as: KENALOG Apply 1 application topically 2 (two) times daily. What changed:   when to  take this  reasons to take this   Trulicity 1.5 RX/5.4MG Sopn Generic drug: Dulaglutide Inject 1.5 mg into the skin once a week. Friday       Allergies  Allergen Reactions  . Doxycycline Hives, Swelling and Rash  . Penicillins Swelling    Has patient had a PCN reaction causing immediate rash, facial/tongue/throat swelling, SOB or lightheadedness with hypotension: No Has patient had a PCN reaction causing severe rash involving mucus membranes or skin necrosis: No Has patient had a PCN reaction that required hospitalization: No Has patient had a PCN reaction occurring within the last 10 years: No If all of the above answers are "NO", then may proceed with Cephalosporin use.  Marland Kitchen Lisinopril Cough  . Tape Hives  . Latex Hives, Itching and Rash    Consultations:  GI   Procedures/Studies: DG Chest Portable 1  View  Result Date: 02/17/2020 CLINICAL DATA:  weakness EXAM: PORTABLE CHEST 1 VIEW COMPARISON:  Chest radiograph 01/13/2020 FINDINGS: Stable cardiomediastinal contours with enlarged heart size. Patient's right chest port is unchanged in position. Low lung volumes. There are mild opacities at the left lung base, similar to prior, likely atelectasis. No new focal consolidation. No pneumothorax or large pleural effusion. No acute finding in the visualized skeleton. IMPRESSION: Low lung volumes with probable left basilar atelectasis, similar to prior. Electronically Signed   By: Audie Pinto M.D.   On: 02/17/2020 17:44   CT Angio Abd/Pel w/ and/or w/o  Result Date: 02/20/2020 CLINICAL DATA:  Portal vein thrombosis follow-up. EXAM: CTA ABDOMEN AND PELVIS WITHOUT AND WITH CONTRAST TECHNIQUE: Multidetector CT imaging of the abdomen and pelvis was performed using the standard protocol during bolus administration of intravenous contrast. Multiplanar reconstructed images and MIPs were obtained and reviewed to evaluate the vascular anatomy. CONTRAST:  68m OMNIPAQUE IOHEXOL 350 MG/ML SOLN COMPARISON:  CT of the pelvis dated January 13, 2020 FINDINGS: VASCULAR Aorta: There are advanced atherosclerotic changes throughout the abdominal aorta without evidence for an aneurysm. Celiac: Mild atherosclerotic changes are noted at the origin of the celiac axis without evidence for high-grade stenosis. SMA: Mild atherosclerotic changes are noted at the origin of the SMA without evidence for high-grade stenosis. Renals: Both renal arteries are patent without evidence of aneurysm, dissection, vasculitis, fibromuscular dysplasia or significant stenosis. The renal are IMA: Patent without evidence of aneurysm, dissection, vasculitis or significant stenosis. Inflow: Atherosclerotic changes are noted of the bilateral common iliac and external iliac arteries resulting in mild stenosis. Proximal Outflow: Bilateral common femoral and  visualized portions of the superficial and profunda femoral arteries are patent without evidence of aneurysm, dissection, vasculitis or significant stenosis. Veins: There has been near complete resolution of the previously demonstrated portal vein thrombosis. Mild residual thrombus is noted at the bifurcation. No new thrombus was identified on today's exam. Review of the MIP images confirms the above findings. NON-VASCULAR Lower chest: There is atelectasis at the lung bases, right worse than left.The heart is enlarged. Hepatobiliary: Again noted is cirrhosis. There is persistent heterogeneity of hepatic segment 7 without evidence for a focal mass. Cholelithiasis without acute inflammation.There is no biliary ductal dilation. Pancreas: Normal contours without ductal dilatation. No peripancreatic fluid collection. Spleen: There is splenomegaly. Adrenals/Urinary Tract: --Adrenal glands: Unremarkable. --Right kidney/ureter: No hydronephrosis or radiopaque kidney stones. --Left kidney/ureter: No hydronephrosis or radiopaque kidney stones. --Urinary bladder: There is calcified debris within the urinary bladder. Stomach/Bowel: --Stomach/Duodenum: There are 2 new metallic densities at the level of the second and third portion of  the duodenum. These are new since prior CT. There may be some mild wall thickening of the gastric antrum. --Small bowel: Unremarkable. --Colon: No focal abnormality. --Appendix: Not visualized. No right lower quadrant inflammation or free fluid. Lymphatic: --No retroperitoneal lymphadenopathy. --No mesenteric lymphadenopathy. --No pelvic or inguinal lymphadenopathy. Reproductive: The previously noted endometrial thickening is better appreciated on the patient's prior CT. Other: There is a small volume abdominal ascites which is relatively stable when compared to prior study. There is some mild body wall edema. Musculoskeletal. No acute displaced fractures. IMPRESSION: 1. Near complete resolution of  the previously demonstrated portal vein thrombosis. Mild residual thrombus is noted at the bifurcation. No new thrombus was identified on today's exam. 2. There are two new metallic densities at the level of the second and third portion of the duodenum. These may represent clips related to prior endoscopic procedure or ingested foreign bodies. Correlation with history is recommended. 3. Cirrhosis with stigmata of portal hypertension including splenomegaly and small volume abdominal ascites. 4. Cholelithiasis without acute inflammation. 5. Cardiomegaly. Aortic Atherosclerosis (ICD10-I70.0). Electronically Signed   By: Constance Holster M.D.   On: 02/20/2020 23:52       Discharge Exam: Vitals:   02/21/20 0403 02/21/20 0757  BP: (!) 132/58 131/65  Pulse: (!) 101 (!) 102  Resp:  16  Temp: 98.9 F (37.2 C) 98.1 F (36.7 C)  SpO2: 92% 92%    General: Pt is alert, awake, not in acute distress Cardiovascular: RRR, S1/S2 +, no edema Respiratory: CTA bilaterally, no wheezing, no rhonchi, no respiratory distress, no conversational dyspnea  Abdominal: Soft, NT, ND, bowel sounds + Extremities: no edema, no cyanosis Psych: Normal mood and affect, stable judgement and insight     The results of significant diagnostics from this hospitalization (including imaging, microbiology, ancillary and laboratory) are listed below for reference.     Microbiology: Recent Results (from the past 240 hour(s))  Respiratory Panel by RT PCR (Flu A&B, Covid) - Nasopharyngeal Swab     Status: None   Collection Time: 02/17/20  9:41 PM   Specimen: Nasopharyngeal Swab  Result Value Ref Range Status   SARS Coronavirus 2 by RT PCR NEGATIVE NEGATIVE Final    Comment: (NOTE) SARS-CoV-2 target nucleic acids are NOT DETECTED. The SARS-CoV-2 RNA is generally detectable in upper respiratoy specimens during the acute phase of infection. The lowest concentration of SARS-CoV-2 viral copies this assay can detect is 131  copies/mL. A negative result does not preclude SARS-Cov-2 infection and should not be used as the sole basis for treatment or other patient management decisions. A negative result may occur with  improper specimen collection/handling, submission of specimen other than nasopharyngeal swab, presence of viral mutation(s) within the areas targeted by this assay, and inadequate number of viral copies (<131 copies/mL). A negative result must be combined with clinical observations, patient history, and epidemiological information. The expected result is Negative. Fact Sheet for Patients:  PinkCheek.be Fact Sheet for Healthcare Providers:  GravelBags.it This test is not yet ap proved or cleared by the [place] FDA and  has been authorized for detection and/or diagnosis of SARS-CoV-2 by FDA under an Emergency Use Authorization (EUA). This EUA will remain  in effect (meaning this test can be used) for the duration of the COVID-19 declaration under Section 564(b)(1) of the Act, 21 U.S.C. section 360bbb-3(b)(1), unless the authorization is terminated or revoked sooner.    Influenza A by PCR NEGATIVE NEGATIVE Final   Influenza B by PCR NEGATIVE NEGATIVE  Final    Comment: (NOTE) The Xpert Xpress SARS-CoV-2/FLU/RSV assay is intended as an aid in  the diagnosis of influenza from Nasopharyngeal swab specimens and  should not be used as a sole basis for treatment. Nasal washings and  aspirates are unacceptable for Xpert Xpress SARS-CoV-2/FLU/RSV  testing. Fact Sheet for Patients: PinkCheek.be Fact Sheet for Healthcare Providers: GravelBags.it This test is not yet approved or cleared by the [place] FDA and  has been authorized for detection and/or diagnosis of SARS-CoV-2 by  FDA under an Emergency Use Authorization (EUA). This EUA will remain  in effect (meaning this test can  be used) for the duration of the  Covid-19 declaration under Section 564(b)(1) of the Act, 21  U.S.C. section 360bbb-3(b)(1), unless the authorization is  terminated or revoked. Performed at [facility], [place] [address] [place], [place] [address]   Urine culture     Status: Abnormal   Collection Time: 02/18/20  3:16 AM   Specimen: Urine, Random  Result Value Ref Range Status   Specimen Description URINE, RANDOM  Final   Special Requests NONE  Final   Culture (A)  Final    >=100,000 COLONIES/mL KLUYVERA ASCORBATA >=100,000 COLONIES/mL PSEUDOMONAS AERUGINOSA    Report Status 02/20/2020 FINAL  Final   Organism ID, Bacteria KLUYVERA ASCORBATA (A)  Final   Organism ID, Bacteria PSEUDOMONAS AERUGINOSA (A)  Final      Susceptibility   Kluyvera ascorbata - MIC*    AMPICILLIN >=32 RESISTANT Resistant     CEFAZOLIN >=64 RESISTANT Resistant     CEFEPIME <=1 SENSITIVE Sensitive     CEFTAZIDIME <=1 SENSITIVE Sensitive     CEFTRIAXONE <=1 SENSITIVE Sensitive     CIPROFLOXACIN <=0.25 SENSITIVE Sensitive     GENTAMICIN <=1 SENSITIVE Sensitive     IMIPENEM <=0.25 SENSITIVE Sensitive     NITROFURANTOIN 32 SENSITIVE Sensitive     TRIMETH/SULFA <=20 SENSITIVE Sensitive     AMPICILLIN/SULBACTAM <=2 SENSITIVE Sensitive     PIP/TAZO Value in next row Sensitive      <=4 SENSITIVEPerformed at [facility] [address] [place], [place] [address]    * >=100,000 COLONIES/mL KLUYVERA ASCORBATA   Pseudomonas aeruginosa - MIC*    CEFTAZIDIME Value in next row Sensitive      <=4 SENSITIVEPerformed at [facility] [address], [place], [place] [address]    CIPROFLOXACIN Value in next row Sensitive      <=4 SENSITIVEPerformed at [facility] [address], [place], [place] [address]    GENTAMICIN Value in next row Sensitive      <=4 SENSITIVEPerformed at [facility] [address], [place], [place] [address]    IMIPENEM Value in next row Sensitive      <=4  SENSITIVEPerformed at [facility] [address], [place], [place] [address]    PIP/TAZO Value in next row Sensitive      <=4 SENSITIVEPerformed at [facility] [address], [place], [place] [address]    CEFEPIME Value in next row Sensitive      <=4 SENSITIVEPerformed at [facility] [address], [place], [place] [address]    * >=100,000 COLONIES/mL PSEUDOMONAS AERUGINOSA     Labs: BNP (last 3 results) Recent Labs    08/10/19 1906  BNP 73.5   Basic Metabolic Panel: Recent Labs  Lab 02/16/20 1350 02/16/20 1350 02/17/20 1821 02/19/20 0232 02/19/20 3299 02/20/20 0226 02/21/20 0220  NA 139  --  136 140  --  138 136  K 2.6*   < > 2.9* 2.9* 3.4* 3.1* 4.0  CL 101  --  99 105  --  104 107  CO2 22  --  23 25  --  24 22  GLUCOSE 131*  --  139* 95  --  110* 132*  BUN 19  --  19 15  --  12 10  CREATININE 1.34*  --  1.49* 1.19*  --  1.17* 1.17*  CALCIUM 8.3*  --  8.2* 8.0*  --  8.0* 8.0*  MG 1.6*  --   --   --  1.5*  --  1.9   < > = values in this interval not displayed.   Liver Function Tests: Recent Labs  Lab 02/16/20 1350 02/17/20 1821  AST 36 39  ALT 11 12  ALKPHOS 101 101  BILITOT 2.0* 1.6*  PROT 5.1* 5.1*  ALBUMIN 2.6* 2.6*   No results for input(s): LIPASE, AMYLASE in the last 168 hours. Recent Labs  Lab 02/17/20 1821  AMMONIA 28   CBC: Recent Labs  Lab 02/17/20 1821 02/17/20 1821 02/17/20 2249 02/17/20 2249 02/18/20 0742 02/18/20 1653 02/19/20 0232 02/20/20 0226 02/21/20 0220  WBC 34.1*  --  18.9*  --   --   --  16.1* 18.5* 22.8*  NEUTROABS 27.4*  --  14.5*  --   --   --   --   --   --   HGB 7.6*   < > 6.1*   < > 8.8* 12.7 11.0* 10.4* 10.7*  HCT 27.2*   < > 21.9*   < > 29.7* 39.6 35.0* 34.1* 36.1  MCV 93.8  --  94.0  --   --   --  89.7 91.7 92.8  PLT 402*  --  244  --   --   --  245 247 222   < > = values in this interval not displayed.   Cardiac Enzymes: No results for input(s): CKTOTAL, CKMB, CKMBINDEX, TROPONINI in  the last 168 hours. BNP: Invalid input(s): POCBNP CBG: Recent Labs  Lab 02/20/20 1630 02/20/20 1933 02/20/20 2343 02/21/20 0402 02/21/20 0750  GLUCAP 138* 158* 169* 120* 139*   D-Dimer No results for input(s): DDIMER in the last 72 hours. Hgb A1c No results for input(s): HGBA1C in the last 72 hours. Lipid Profile No results for input(s): CHOL, HDL, LDLCALC, TRIG, CHOLHDL, LDLDIRECT in the last 72 hours. Thyroid function studies No results for input(s): TSH, T4TOTAL, T3FREE, THYROIDAB in the last 72 hours.  Invalid input(s): FREET3 Anemia work up No results for input(s): VITAMINB12, FOLATE, FERRITIN, TIBC, IRON, RETICCTPCT in the last 72 hours. Urinalysis    Component Value Date/Time   COLORURINE YELLOW 02/18/2020 0316   APPEARANCEUR HAZY (A) 02/18/2020 0316   LABSPEC 1.009 02/18/2020 0316   PHURINE 6.0 02/18/2020 0316   GLUCOSEU NEGATIVE 02/18/2020 0316   GLUCOSEU NEGATIVE 01/10/2018 1217   HGBUR NEGATIVE 02/18/2020 0316   HGBUR large 05/07/2010 0845   BILIRUBINUR NEGATIVE 02/18/2020 0316   BILIRUBINUR neg 01/26/2016 1003   KETONESUR NEGATIVE 02/18/2020 0316   PROTEINUR NEGATIVE 02/18/2020 0316   UROBILINOGEN 0.2 01/10/2018 1217   NITRITE POSITIVE (A) 02/18/2020 0316   LEUKOCYTESUR MODERATE (A) 02/18/2020 0316   Sepsis Labs Invalid input(s): PROCALCITONIN,  WBC,  LACTICIDVEN Microbiology Recent Results (from the past 240 hour(s))  Respiratory Panel by RT PCR (Flu A&B, Covid) - Nasopharyngeal Swab  Status: None   Collection Time: 02/17/20  9:41 PM   Specimen: Nasopharyngeal Swab  Result Value Ref Range Status   SARS Coronavirus 2 by RT PCR NEGATIVE NEGATIVE Final    Comment: (NOTE) SARS-CoV-2 target nucleic acids are NOT DETECTED. The SARS-CoV-2 RNA is generally detectable in upper respiratoy specimens during the acute phase of infection. The lowest concentration of SARS-CoV-2 viral copies this assay can detect is 131 copies/mL. A negative result does not  preclude SARS-Cov-2 infection and should not be used as the sole basis for treatment or other patient management decisions. A negative result may occur with  improper specimen collection/handling, submission of specimen other than nasopharyngeal swab, presence of viral mutation(s) within the areas targeted by this assay, and inadequate number of viral copies (<131 copies/mL). A negative result must be combined with clinical observations, patient history, and epidemiological information. The expected result is Negative. Fact Sheet for Patients:  PinkCheek.be Fact Sheet for Healthcare Providers:  GravelBags.it This test is not yet ap proved or cleared by the [place] FDA and  has been authorized for detection and/or diagnosis of SARS-CoV-2 by FDA under an Emergency Use Authorization (EUA). This EUA will remain  in effect (meaning this test can be used) for the duration of the COVID-19 declaration under Section 564(b)(1) of the Act, 21 U.S.C. section 360bbb-3(b)(1), unless the authorization is terminated or revoked sooner.    Influenza A by PCR NEGATIVE NEGATIVE Final   Influenza B by PCR NEGATIVE NEGATIVE Final    Comment: (NOTE) The Xpert Xpress SARS-CoV-2/FLU/RSV assay is intended as an aid in  the diagnosis of influenza from Nasopharyngeal swab specimens and  should not be used as a sole basis for treatment. Nasal washings and  aspirates are unacceptable for Xpert Xpress SARS-CoV-2/FLU/RSV  testing. Fact Sheet for Patients: PinkCheek.be Fact Sheet for Healthcare Providers: GravelBags.it This test is not yet approved or cleared by the [place] FDA and  has been authorized for detection and/or diagnosis of SARS-CoV-2 by  FDA under an Emergency Use Authorization (EUA). This EUA will remain  in effect (meaning this test can be used) for the duration of the   Covid-19 declaration under Section 564(b)(1) of the Act, 21  U.S.C. section 360bbb-3(b)(1), unless the authorization is  terminated or revoked. Performed at [facility], [place] [address], [place], [place] [address]   Urine culture     Status: Abnormal   Collection Time: 02/18/20  3:16 AM   Specimen: Urine, Random  Result Value Ref Range Status   Specimen Description URINE, RANDOM  Final   Special Requests NONE  Final   Culture (A)  Final    >=100,000 COLONIES/mL KLUYVERA ASCORBATA >=100,000 COLONIES/mL PSEUDOMONAS AERUGINOSA    Report Status 02/20/2020 FINAL  Final   Organism ID, Bacteria KLUYVERA ASCORBATA (A)  Final   Organism ID, Bacteria PSEUDOMONAS AERUGINOSA (A)  Final      Susceptibility   Kluyvera ascorbata - MIC*    AMPICILLIN >=32 RESISTANT Resistant     CEFAZOLIN >=64 RESISTANT Resistant     CEFEPIME <=1 SENSITIVE Sensitive     CEFTAZIDIME <=1 SENSITIVE Sensitive     CEFTRIAXONE <=1 SENSITIVE Sensitive     CIPROFLOXACIN <=0.25 SENSITIVE Sensitive     GENTAMICIN <=1 SENSITIVE Sensitive     IMIPENEM <=0.25 SENSITIVE Sensitive     NITROFURANTOIN 32 SENSITIVE Sensitive     TRIMETH/SULFA <=20 SENSITIVE Sensitive     AMPICILLIN/SULBACTAM <=2 SENSITIVE Sensitive     PIP/TAZO Value  in next row Sensitive      <=4 SENSITIVEPerformed at [facility] [address], [place], [place] [address]    * >=100,000 COLONIES/mL KLUYVERA ASCORBATA   Pseudomonas aeruginosa - MIC*    CEFTAZIDIME Value in next row Sensitive      <=4 SENSITIVEPerformed at [facility] [address], [place], [place] [address]    CIPROFLOXACIN Value in next row Sensitive      <=4 SENSITIVEPerformed at [facility] [address] [place], [place] [address]    GENTAMICIN Value in next row Sensitive      <=4 SENSITIVEPerformed at [facility] [address] [place], [place] [address]    IMIPENEM Value in next row Sensitive      <=4 SENSITIVEPerformed at [facility] [address] [place], [place] [address]    PIP/TAZO Value in next row Sensitive      <=4 SENSITIVEPerformed at [facility] [address] [place], [place] [address]    CEFEPIME Value in next row Sensitive      <=4 SENSITIVEPerformed at [facility] [address] [place], [place] [address]    * >=100,000 COLONIES/mL PSEUDOMONAS AERUGINOSA     Patient was seen and examined on the day of discharge and was found to be in stable condition. Time coordinating discharge: 35 minutes including assessment and coordination of care, as well as examination of the patient.   SIGNED:  Dessa Phi, DO [facility] 02/21/2020, 9:32 AM

## 2020-02-21 NOTE — Evaluation (Signed)
Occupational Therapy Evaluation Patient Details Name: Amanda [name] MRN: [id number] DOB: 07/12/1941 Today's Date: 02/21/2020    History of Present Illness 79 y.o. 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 Pt d/c to SNF, had ED admission on 5/1 after SNF d/c for low Hgb and then was d/c. Pt back to ED from home on 5/2 for weakness, inability to mobilize. s/p Small bowel enteroscopy on 5/4 with reaveals gastritis and no active bleeding found.   Clinical Impression   Pt progressing towards OT goals, says that she is discharging today to SNF. Able to sit EOB for basic grooming tasks, mod A for hand held assist to side step up the bed and then max A to return supine due to pending DC. Pt will benefit from OT at the SNF level to maximize safety and independence in ADL and functional transfers - as well as address participation in meaningful occupations to make patient feel more fulfilled and bring her joy.     Follow Up Recommendations  SNF    Equipment Recommendations  Other (comment)(defer to SNF)    Recommendations for Other Services       Precautions / Restrictions Precautions Precautions: Fall Precaution Comments: fear of falling Restrictions Weight Bearing Restrictions: No      Mobility Bed Mobility Overal bed mobility: Needs Assistance Bed Mobility: Rolling;Sidelying to Sit;Sit to Supine Rolling: Mod assist Sidelying to sit: Mod assist;HOB elevated   Sit to supine: Max assist;HOB elevated   General bed mobility comments: mod A to bring LEs to EOB and elevate trunk, max A with LEs back onto bed  Transfers Overall transfer level: Needs assistance Equipment used: 1 person hand held  assist Transfers: Sit to/from Stand Sit to Stand: Mod assist;From elevated surface         General transfer comment: Mod assist for power up, steadying. Increased time to rise to upright, verbal cues for correct hand placement when rising. Pt able to sid estep 5 steps to [facility], unsteady, anxious and fearful    Balance Overall balance assessment: Needs assistance;History of Falls Sitting-balance support: Feet supported;Bilateral upper extremity supported Sitting balance-Leahy Scale: Fair     Standing balance support: Bilateral upper extremity supported;During functional activity Standing balance-Leahy Scale: Poor Standing balance comment: reliant on external assist                           ADL either performed or assessed with clinical judgement   ADL Overall ADL's : Needs assistance/impaired     Grooming: Wash/dry face;Wash/dry hands;Brushing hair;Min guard;Sitting Grooming Details (indicate cue type and reason): EOB             Lower Body Dressing: Total assistance Lower Body Dressing Details (indicate cue type and reason): to don socks, bed level Toilet Transfer: Moderate assistance Toilet Transfer Details (indicate cue type and reason): for small steps up the bed         Functional mobility during ADLs: Moderate assistance(1 person HHA)       Vision         Perception     Praxis      Pertinent Vitals/Pain Pain Assessment: No/denies pain     Hand Dominance     Extremity/Trunk  Assessment Upper Extremity Assessment Upper Extremity Assessment: Generalized weakness   Lower Extremity Assessment Lower Extremity Assessment: Defer to PT evaluation       Communication     Cognition Arousal/Alertness: Awake/alert Behavior During Therapy: [facility] for tasks assessed/performed;Anxious Overall Cognitive Status: Within Functional Limits for tasks assessed                                 General Comments: hard of hearing, but A&Ox4 and  interactive. Very anxious about standing due to fear of falling.   General Comments  VSS, husband in room    Exercises     Shoulder Instructions      Home Living                                          Prior Functioning/Environment                   OT Problem List:        OT Treatment/Interventions:      OT Goals(Current goals can be found in the care plan section) Acute Rehab OT Goals Patient Stated Goal: get back to walking OT Goal Formulation: With patient/family Time For Goal Achievement: 03/05/20  OT Frequency: Min 2X/week   Barriers to D/C:            Co-evaluation              AM-PAC OT "6 Clicks" Daily Activity     Outcome Measure Help from another person eating meals?: A Little Help from another person taking care of personal grooming?: A Little Help from another person toileting, which includes using toliet, bedpan, or urinal?: Total Help from another person bathing (including washing, rinsing, drying)?: A Lot Help from another person to put on and taking off regular upper body clothing?: A Lot Help from another person to put on and taking off regular lower body clothing?: Total 6 Click Score: 12   End of Session Equipment Utilized During Treatment: Gait belt  Activity Tolerance: Patient limited by fatigue Patient left: in bed;with call bell/phone within reach;with family/visitor present  OT Visit Diagnosis: Unsteadiness on feet (R26.81);Other abnormalities of gait and mobility (R26.89);History of falling (Z91.81);Muscle weakness (generalized) (M62.81)                Time: 1025-8527 OT Time Calculation (min): 21 min Charges:  OT General Charges $OT Visit: 1 Visit OT Treatments $Self Care/Home Management : 8-22 mins  Jesse Sans OTR/L Acute Rehabilitation Services Pager: [phone] Office: [place] 02/21/2020, 12:39 PM

## 2020-02-27 ENCOUNTER — Ambulatory Visit: Payer: Medicare HMO | Admitting: Gastroenterology

## 2020-02-27 ENCOUNTER — Encounter: Payer: Self-pay | Admitting: Gastroenterology

## 2020-03-04 ENCOUNTER — Encounter: Payer: Self-pay | Admitting: Family

## 2020-03-04 ENCOUNTER — Inpatient Hospital Stay: Payer: Medicare HMO | Attending: Hematology & Oncology

## 2020-03-04 ENCOUNTER — Other Ambulatory Visit: Payer: Self-pay

## 2020-03-04 ENCOUNTER — Inpatient Hospital Stay: Payer: Medicare HMO

## 2020-03-04 ENCOUNTER — Other Ambulatory Visit: Payer: Medicare HMO

## 2020-03-04 ENCOUNTER — Inpatient Hospital Stay (HOSPITAL_BASED_OUTPATIENT_CLINIC_OR_DEPARTMENT_OTHER): Payer: Medicare HMO | Admitting: Family

## 2020-03-04 VITALS — BP 124/36 | HR 69 | Temp 96.9°F | Resp 18

## 2020-03-04 DIAGNOSIS — I998 Other disorder of circulatory system: Secondary | ICD-10-CM | POA: Insufficient documentation

## 2020-03-04 DIAGNOSIS — D500 Iron deficiency anemia secondary to blood loss (chronic): Secondary | ICD-10-CM

## 2020-03-04 DIAGNOSIS — D45 Polycythemia vera: Secondary | ICD-10-CM

## 2020-03-04 LAB — CBC WITH DIFFERENTIAL (CANCER CENTER ONLY)
Abs Immature Granulocytes: 0.04 10*3/uL (ref 0.00–0.07)
Basophils Absolute: 0 10*3/uL (ref 0.0–0.1)
Basophils Relative: 0 %
Eosinophils Absolute: 0 10*3/uL (ref 0.0–0.5)
Eosinophils Relative: 0 %
HCT: 33.4 % — ABNORMAL LOW (ref 36.0–46.0)
Hemoglobin: 10.4 g/dL — ABNORMAL LOW (ref 12.0–15.0)
Immature Granulocytes: 1 %
Lymphocytes Relative: 22 %
Lymphs Abs: 1 10*3/uL (ref 0.7–4.0)
MCH: 28 pg (ref 26.0–34.0)
MCHC: 31.1 g/dL (ref 30.0–36.0)
MCV: 89.8 fL (ref 80.0–100.0)
Monocytes Absolute: 0.3 10*3/uL (ref 0.1–1.0)
Monocytes Relative: 6 %
Neutro Abs: 3.4 10*3/uL (ref 1.7–7.7)
Neutrophils Relative %: 71 %
Platelet Count: 152 10*3/uL (ref 150–400)
RBC: 3.72 MIL/uL — ABNORMAL LOW (ref 3.87–5.11)
RDW: 17.6 % — ABNORMAL HIGH (ref 11.5–15.5)
WBC Count: 4.7 10*3/uL (ref 4.0–10.5)
nRBC: 0 % (ref 0.0–0.2)

## 2020-03-04 LAB — CMP (CANCER CENTER ONLY)
ALT: 18 U/L (ref 0–44)
AST: 88 U/L — ABNORMAL HIGH (ref 15–41)
Albumin: 3.4 g/dL — ABNORMAL LOW (ref 3.5–5.0)
Alkaline Phosphatase: 144 U/L — ABNORMAL HIGH (ref 38–126)
Anion gap: 8 (ref 5–15)
BUN: 13 mg/dL (ref 8–23)
CO2: 25 mmol/L (ref 22–32)
Calcium: 8.7 mg/dL — ABNORMAL LOW (ref 8.9–10.3)
Chloride: 106 mmol/L (ref 98–111)
Creatinine: 1.12 mg/dL — ABNORMAL HIGH (ref 0.44–1.00)
GFR, Est AFR Am: 54 mL/min — ABNORMAL LOW (ref >60)
GFR, Estimated: 46 mL/min — ABNORMAL LOW (ref >60)
Glucose, Bld: 120 mg/dL — ABNORMAL HIGH (ref 70–99)
Potassium: 3.3 mmol/L — ABNORMAL LOW (ref 3.5–5.1)
Sodium: 139 mmol/L (ref 135–145)
Total Bilirubin: 1.4 mg/dL — ABNORMAL HIGH (ref 0.3–1.2)
Total Protein: 5.2 g/dL — ABNORMAL LOW (ref 6.5–8.1)

## 2020-03-04 LAB — LACTATE DEHYDROGENASE: LDH: 250 U/L — ABNORMAL HIGH (ref 98–192)

## 2020-03-04 NOTE — Patient Instructions (Signed)

## 2020-03-04 NOTE — Progress Notes (Signed)
Hematology and Oncology Follow Up Visit  Amanda [name] [id number] 12-28-1940 79 y.o. 03/04/2020   Principle Diagnosis:  [name] - JAK2 (+)  Current Therapy:        Jakafi 25 mg po q day   Interim History:  Amanda [name] is here today for follow-up. She is currently at [facility] rehab facility.  Earlier this month she had a left heart catheterization: coronary angiography, left ventriculography, temporary pacemaker, percutaneous coronary intervention with PTCA/TES stenting to the mid and distal left circumflex coronary artery.   She also had a GI bleed requiring 3 units of blood during admission. She an EGD which showed four nonbleeding angiectasia is in the distal duodenum and jejunum, treated with APC. She sees GI again in July.  She is slowly recuperating and is now able to walk several feet using a walker.  She is still fatigued but feels that she is resting well at night.  No fever, chills, n/v, cough, rash, chest pain, palpitations, abdominal pain or changes in bowel or bladder habits.  She has positional numbness and tingling in the right hand due to her wheelchair. This resolves when she is able to move around.  She states that the swelling in her lower extremities has improved since she got a recliner in her room yesterday and is able to elevate her feet.  No falls or syncopal episodes to report.  Her appetite is much better and she feels that she is hydrating well. She did not stand for a weight today.   ECOG Performance Status: 2 - Symptomatic, <50% confined to bed  Medications:  Allergies as of 03/04/2020      Reactions   Doxycycline Hives, Swelling, Rash   Penicillins Swelling   Has patient had a PCN reaction causing immediate rash, facial/tongue/throat swelling, SOB or lightheadedness with hypotension: No Has patient had a PCN reaction causing severe rash involving mucus membranes or skin necrosis: No Has patient had a PCN reaction that required hospitalization:  No Has patient had a PCN reaction occurring within the last 10 years: No If all of the above answers are "NO", then may proceed with Cephalosporin use.   Lisinopril Cough   Tape Hives   Latex Hives, Itching, Rash      Medication List       Accurate as of Mar 04, 2020  1:28 PM. If you have any questions, ask your nurse or doctor.        Accu-Chek Guide w/Device Kit 1 kit by Does not apply route as directed.   acetaminophen 325 MG tablet Commonly known as: TYLENOL Take 2 tablets (650 mg total) by mouth every 6 (six) hours as needed for mild pain (or Fever >/= 101).   ezetimibe 10 MG tablet Commonly known as: ZETIA Take 1 tablet (10 mg total) by mouth daily.   furosemide 40 MG tablet Commonly known as: LASIX TAKE 1 TABLET(40 MG) BY MOUTH DAILY What changed: See the new instructions.   glucose blood test strip Commonly known as: Accu-Chek Guide Use to check blood sugars 3 times daily   OneTouch Verio test strip Generic drug: glucose blood USE TO TEST BLOOD SUGAR THREE TIMES DAILY   insulin aspart 100 UNIT/ML injection Commonly known as: novoLOG Inject 0-15 Units into the skin 3 (three) times daily with meals. CBG 70 - 120: 0 units CBG 121 - 150: 2 units CBG 151 - 200: 3 units CBG 201 - 250: 5 units CBG 251 - 300: 8 units CBG 301 -  350: 11 units CBG 351 - 400: 15 units What changed:   how much to take  when to take this  additional instructions   Jakafi 25 MG tablet Generic drug: ruxolitinib phosphate Take 25 mg by mouth 2 (two) times daily.   lactulose 10 GM/15ML solution Commonly known as: CHRONULAC Take 30 mLs (20 g total) by mouth 2 (two) times daily.   latanoprost 0.005 % ophthalmic solution Commonly known as: XALATAN Place 1 drop into both eyes at bedtime.   lidocaine-prilocaine cream Commonly known as: EMLA Apply 1 application topically as needed. Please apply EMLA over the Port-A-Cath site 1 hour prior to coming to our office. What changed:    when to take this  reasons to take this  additional instructions   mirtazapine 15 MG tablet Commonly known as: REMERON Take 1 tablet (15 mg total) by mouth at bedtime.   pantoprazole 40 MG tablet Commonly known as: PROTONIX Take 1 tablet (40 mg total) by mouth 2 (two) times daily before a meal.   spironolactone 25 MG tablet Commonly known as: ALDACTONE Take 0.5 tablets (12.5 mg total) by mouth daily.   timolol 0.5 % ophthalmic solution Commonly known as: TIMOPTIC Place 1 drop into both eyes daily.   triamcinolone cream 0.1 % Commonly known as: KENALOG Apply 1 application topically 2 (two) times daily. What changed:   when to take this  reasons to take this   Trulicity 1.5 HK/7.4QV Sopn Generic drug: Dulaglutide Inject 1.5 mg into the skin once a week. Friday       Allergies:  Allergies  Allergen Reactions  . Doxycycline Hives, Swelling and Rash  . Penicillins Swelling    Has patient had a PCN reaction causing immediate rash, facial/tongue/throat swelling, SOB or lightheadedness with hypotension: No Has patient had a PCN reaction causing severe rash involving mucus membranes or skin necrosis: No Has patient had a PCN reaction that required hospitalization: No Has patient had a PCN reaction occurring within the last 10 years: No If all of the above answers are "NO", then may proceed with Cephalosporin use.  Marland Kitchen Lisinopril Cough  . Tape Hives  . Latex Hives, Itching and Rash    Past Medical History, Surgical history, Social history, and Family History were reviewed and updated.  Review of Systems: All other 10 point review of systems is negative.   Physical Exam:  vitals were not taken for this visit.   Wt Readings from Last 3 Encounters:  02/17/20 190 lb (86.2 kg)  02/16/20 190 lb (86.2 kg)  01/18/20 216 lb 4.3 oz (98.1 kg)    Ocular: Sclerae unicteric, pupils equal, round and reactive to light Ear-nose-throat: Oropharynx clear, dentition  fair Lymphatic: No cervical or supraclavicular adenopathy Lungs no rales or rhonchi, good excursion bilaterally Heart regular rate and rhythm, no murmur appreciated Abd soft, nontender, positive bowel sounds, no liver or spleen tip palpated on exam, no fluid wave  MSK no focal spinal tenderness, no joint edema Neuro: non-focal, well-oriented, appropriate affect Breasts: Deferred   Lab Results  Component Value Date   WBC 4.7 03/04/2020   HGB 10.4 (L) 03/04/2020   HCT 33.4 (L) 03/04/2020   MCV 89.8 03/04/2020   PLT 152 03/04/2020   Lab Results  Component Value Date   FERRITIN 160 11/01/2019   IRON 92 11/01/2019   TIBC 276 11/01/2019   UIBC 184 11/01/2019   IRONPCTSAT 33 11/01/2019   Lab Results  Component Value Date   RETICCTPCT 7.1 (H) 01/24/2020  RBC 3.72 (L) 03/04/2020   No results found for: Nils Pyle [facility] Lab Results  Component Value Date   IGA 233 03/29/2014   No results found for: Kathrynn Ducking, MSPIKE, SPEI   Chemistry      Component Value Date/Time   NA 136 02/21/2020 0220   NA 140 10/12/2017 1013   NA 138 01/21/2017 0806   K 4.0 02/21/2020 0220   K 3.7 10/12/2017 1013   K 4.1 01/21/2017 0806   CL 107 02/21/2020 0220   CL 103 10/12/2017 1013   CL 103 02/16/2013 0900   CO2 22 02/21/2020 0220   CO2 25 10/12/2017 1013   CO2 21 (L) 01/21/2017 0806   BUN 10 02/21/2020 0220   BUN 10 10/12/2017 1013   BUN 12.2 01/21/2017 0806   CREATININE 1.17 (H) 02/21/2020 0220   CREATININE 1.06 (H) 11/01/2019 1430   CREATININE 0.9 10/12/2017 1013   CREATININE 1.0 01/21/2017 0806      Component Value Date/Time   CALCIUM 8.0 (L) 02/21/2020 0220   CALCIUM 8.7 10/12/2017 1013   CALCIUM 9.3 01/21/2017 0806   ALKPHOS 101 02/17/2020 1821   ALKPHOS 97 (H) 10/12/2017 1013   ALKPHOS 112 01/21/2017 0806   AST 39 02/17/2020 1821   AST 33 11/01/2019 1430   AST 37 (H) 01/21/2017 0806   ALT 12 02/17/2020 1821    ALT 9 11/01/2019 1430   ALT 25 10/12/2017 1013   ALT 16 01/21/2017 0806   BILITOT 1.6 (H) 02/17/2020 1821   BILITOT 1.3 (H) 11/01/2019 1430   BILITOT 0.89 01/21/2017 0806       Impression and Plan: Ms. Deremer is a very pleasant 79 yo caucasian female with polycythemia vera, JAK-2 positive.  She is tolerating Jakafi nicely and seems to be responding well.  She will continue her same regimen.  We will plan to see her back in another 3 months. Hopefully by then she will be back home with her husband.  They will contact our office with any questions or concerns. We can certainly see her sooner if needed.   Laverna Peace, NP 5/18/20211:28 PM

## 2020-03-05 DIAGNOSIS — I5032 Chronic diastolic (congestive) heart failure: Secondary | ICD-10-CM | POA: Diagnosis not present

## 2020-03-05 DIAGNOSIS — I998 Other disorder of circulatory system: Secondary | ICD-10-CM | POA: Diagnosis not present

## 2020-03-05 DIAGNOSIS — D471 Chronic myeloproliferative disease: Secondary | ICD-10-CM | POA: Diagnosis not present

## 2020-03-05 DIAGNOSIS — N183 Chronic kidney disease, stage 3 unspecified: Secondary | ICD-10-CM | POA: Diagnosis not present

## 2020-03-05 DIAGNOSIS — E118 Type 2 diabetes mellitus with unspecified complications: Secondary | ICD-10-CM | POA: Diagnosis not present

## 2020-03-05 DIAGNOSIS — D6489 Other specified anemias: Secondary | ICD-10-CM | POA: Diagnosis not present

## 2020-03-06 MED FILL — JAKAFI 25 MG TABLET: 25 | 30 days supply | Qty: 60 | Fill #5

## 2020-03-11 ENCOUNTER — Telehealth: Payer: Self-pay | Admitting: Gastroenterology

## 2020-03-11 DIAGNOSIS — I5032 Chronic diastolic (congestive) heart failure: Secondary | ICD-10-CM | POA: Diagnosis not present

## 2020-03-11 DIAGNOSIS — I998 Other disorder of circulatory system: Secondary | ICD-10-CM | POA: Diagnosis not present

## 2020-03-11 DIAGNOSIS — D6489 Other specified anemias: Secondary | ICD-10-CM | POA: Diagnosis not present

## 2020-03-11 DIAGNOSIS — E118 Type 2 diabetes mellitus with unspecified complications: Secondary | ICD-10-CM | POA: Diagnosis not present

## 2020-03-11 DIAGNOSIS — N178 Other acute kidney failure: Secondary | ICD-10-CM | POA: Diagnosis not present

## 2020-03-11 DIAGNOSIS — N390 Urinary tract infection, site not specified: Secondary | ICD-10-CM | POA: Diagnosis not present

## 2020-03-11 DIAGNOSIS — K7469 Other cirrhosis of liver: Secondary | ICD-10-CM | POA: Diagnosis not present

## 2020-03-11 NOTE — Telephone Encounter (Signed)
PCP called to advise that the patient's hemoglobin levels are getting lower and has hx of GI bleed

## 2020-03-12 NOTE — Telephone Encounter (Signed)
Can you please call her and tell her we moved her appt to 5/27 (tomorrow) at 3 pm with [place].  Make sure she has her labs faxed to our office. Thanks

## 2020-03-12 NOTE — Telephone Encounter (Signed)
Janett Billow I called PCP and they state that they have not called about the pt.  Do you know who called?

## 2020-03-12 NOTE — Telephone Encounter (Signed)
Returned call to the PCP and left a message for a return call to discuss.

## 2020-03-12 NOTE — Telephone Encounter (Signed)
Im sorry I thought she said PCP so now Im not sure I recall her wanting to get the patient in sooner

## 2020-03-13 ENCOUNTER — Other Ambulatory Visit: Payer: Self-pay

## 2020-03-13 ENCOUNTER — Ambulatory Visit: Payer: Medicare HMO | Admitting: Nurse Practitioner

## 2020-03-13 ENCOUNTER — Encounter: Payer: Self-pay | Admitting: Nurse Practitioner

## 2020-03-13 VITALS — BP 102/52 | HR 81 | Ht 64.5 in | Wt 193.0 lb

## 2020-03-13 DIAGNOSIS — R188 Other ascites: Secondary | ICD-10-CM | POA: Diagnosis not present

## 2020-03-13 DIAGNOSIS — K7460 Unspecified cirrhosis of liver: Secondary | ICD-10-CM

## 2020-03-13 DIAGNOSIS — D471 Chronic myeloproliferative disease: Secondary | ICD-10-CM | POA: Diagnosis not present

## 2020-03-13 DIAGNOSIS — K922 Gastrointestinal hemorrhage, unspecified: Secondary | ICD-10-CM

## 2020-03-13 DIAGNOSIS — I2510 Atherosclerotic heart disease of native coronary artery without angina pectoris: Secondary | ICD-10-CM | POA: Diagnosis not present

## 2020-03-13 DIAGNOSIS — M6281 Muscle weakness (generalized): Secondary | ICD-10-CM | POA: Diagnosis not present

## 2020-03-13 NOTE — Telephone Encounter (Signed)
Called patient to advise spoke with her husband and he said ok also stated her recent labs were done with Oncologist.

## 2020-03-13 NOTE — Patient Outreach (Signed)
[facility]) Care Management  03/13/2020  SHAKIRRA BUEHLER 1941-07-07 [id number]   Referral Date: 03/13/20 Referral Source:  [employer] Report Date of Discharge: 03/12/20 Facility: [place]  Referral received.  No outreach warranted at this time.  Transition of Care calls being completed via EMMI. RN CM will outreach patient for any red flags received.    Plan: RN CM will close case.      Jone Baseman, RN, MSN [facility] Care Management Care Management Coordinator Direct Line [phone] Toll Free: [phone]  Fax: [phone]

## 2020-03-13 NOTE — Patient Instructions (Addendum)
If you are age 81 or older, your body mass index should be between 23-30. Your Body mass index is 32.62 kg/m. If this is out of the aforementioned range listed, please consider follow up with your Primary Care Provider.  If you are age 26 or younger, your body mass index should be between 19-25. Your Body mass index is 32.62 kg/m. If this is out of the aformentioned range listed, please consider follow up with your Primary Care Provider.   Complete your labwork one week from today.  Call our office if black stools reoccur.  Follow up in 6-8 weeks with Dr [name] on 05/06/2020 at 1:50 pm  Due to recent changes in healthcare laws, you may see the results of your imaging and laboratory studies on MyChart before your provider has had a chance to review them.  We understand that in some cases there may be results that are confusing or concerning to you. Not all laboratory results come back in the same time frame and the provider may be waiting for multiple results in order to interpret others.  Please give [place] 48 hours in order for your provider to thoroughly review all the results before contacting the office for clarification of your results.   Thank you for choosing [facility] Noralyn Pick, CRNP

## 2020-03-13 NOTE — Progress Notes (Signed)
03/13/2020 Amanda [name] [id number] 1941-07-18   Chief Complaint: Hospital follow up  History of Present Illness: Amanda [name] TZ1 y.o.femalewithhistory of depression, hypertension, CAD, STEMI 01/2015 s/p stent x 2 on Plavix, polycythemia vera. 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 difficile in 2015.  She has been admitted to the hospital 3 times over the past 2 months.   She was admitted to [facility] 12/28/2019 with generalized weakness x 4 weeks and nausea, vomiting, abdominal pain and diarrhea x 10 days. WBC 44. K+ 2.8. Flagyl and Cefepime IV were started. An abdominal/pelvic CT scan showed cirrhosis with small volume ascites, portal hypertension, portal vein thrombosis, mild diffuse colonic wall thickening (possibly due to ascites) and mild diverticulosis in the distal descending and sigmoid colon. A liver protocol CT was done 12/30/2019 showed advanced cirrhosis with a ill-defined geographic region of abnormal density in the segment 7 of the right liver lobe. Stable nonocclusive band appearing thrombosis of the main and right portal veins. Small amount of ascites and mild splenomegaly. Small periumbilical varices. The gallbladder was distended with evidence of cholelithiasis. No ductal dilatation or evidence of acute cystitis.  She was unable to proceed with an abdominal MRI due to having claustrophobia. She underwent paracentesis with removal of 1.5 L of yellow peritoneal fluid without evidence of SBP. She developed encephalopathy and she was started on Lactulose. She was started on IV Heparin and subsequently changed to Eliquis for management of her portal vein thrombosis. Her diarrhea improved. No infectius source of for her diarrhea or SIRS  was identified. She was discharged home on  (Eliquis, Plavix was previously discontinued) on 01/02/2020 with the instruction  to follow up with Dr. [name] and Dr [name] in 2 to 3 weeks. Re-image the portal vein in 2 to 3 months.   She was admitted to [facility] on 01/13/2020 with nausea, vomiting with poor po intake and  failure to thrive. CTAP without acute abnormality. She was diagnosed with E. Coli/Acinetobacter UTI which was treated with Meropenem.  Her Hg level dropped from 10 -11 down to 8.4. Her husband reported her stools were black for a few weeks prior to her 3/28 hospital admission. Eliquis was held. She received IV PPI.  She underwent an EGD 4/5 which showed a normal esophagus, no varices. 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 Gastric mucosal atrophy with congestions likely due to portal hypertension. No gastric ulcers. A colonoscopy was deferred. Her Hg level remained stable at 8.6. Eliquis was restarted 1 to 2 days later. She was discharged to rehab on 01/25/2020.   She was readmitted to [facility] on 02/16/2020 with acute anemia without obvious sings of GI bleeding. Admission Hg of 6.6. She received 3 units of PRBCs. She underwent a small bowel enteroscopy 5/4  which showed gastritis, four nonbleeding AVMs in the distal duodoenum and jejunum treated with APC. Previously placed endoclips in the duodenum were intact. No evidence of active or recent bleeding. Biopsies were negative for H. Pylori. An abdominal/pelvic CT angiogram 5/5 showed near complete resolution of the portal vein thrombosis, mild residual thrombus to the bifurcation, mild stenosis to the common iliac arteries and cholelithiasis. No evidence of active bleeding. She was discharged to [facility] rehab 5/6 on Pantoprazole 54m po bid. Spironolactone 257mpo QD. Eliquis was not restarted.   She presents  today accompanied by her husband for further follow up. She continues to feel fatigued. No confusion as confirmed by her husband. She remains off Lactulose which resulted in "severe" diarrhea.  Appetite is  slowly improving. Weight stable 190 -193 lbs. She is passing a normal brown formed stool once daily. No rectal bleeding or melena. No N/V. No dysuria. She continues to have lower extremity swelling which continues to decrease. No chest pain or SOB.She is having some dizziness which she stated was due to vertigo which has been addressed by her PCP. No other complaints today.   CBC Latest Ref Rng & Units 03/04/2020 02/21/2020 02/20/2020  WBC 4.0 - 10.5 K/uL 4.7 22.8(H) 18.5(H)  Hemoglobin 12.0 - 15.0 g/dL 10.4(L) 10.7(L) 10.4(L)  Hematocrit 36.0 - 46.0 % 33.4(L) 36.1 34.1(L)  Platelets 150 - 400 K/uL 152 222 247   CMP Latest Ref Rng & Units 03/04/2020 02/21/2020 02/20/2020  Glucose 70 - 99 mg/dL 120(H) 132(H) 110(H)  BUN 8 - 23 mg/dL _0 Creatinine 0.44 - 1.00 mg/dL 1.12(H) 1.17(H) 1.17(H)  Sodium 135 - 145 mmol/L 139 136 138  Potassium 3.5 - 5.1 mmol/L 3.3(L) 4.0 3.1(L)  Chloride 98 - 111 mmol/L 106 107 104  CO2 22 - 32 mmol/L _1 Calcium 8.9 - 10.3 mg/dL 8.7(L) 8.0(L) 8.0(L)  Total Protein 6.5 - 8.1 g/dL 5.2(L) - -  Total Bilirubin 0.3 - 1.2 mg/dL 1.4(H) - -  Alkaline Phos 38 - 126 U/L 144(H) - -  AST 15 - 41 U/L 88(H) - -  ALT 0 - 44 U/L 18 - -    Small bowel capsule endoscopy 02/19/2020 by Dr. Tarri Glenn: - Tortuous esophagus. - Gastritis. Biopsied. - Endoclips seen in the duodenum from last endoscopy. 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 Treated with argon plasma coagulation (APC). - No evidence for active or recent bleeding.  EGD 01/21/2020 at [facility] due to UGI bleed: - Esophagogastric landmarks identified. - Normal esophagus otherwise. No varices. - Gastric mucosal atrophy with congestion, likely due to portal hypertension. No gastric ulcers.. 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 Treated with argon plasma coagulation (APC). 2 Clips were placed    Current  Medications, Allergies, Past Medical History, Past Surgical History, Family History and Social History were reviewed in [employer] record.   Physical Exam: BP (!) 102/52   Pulse 81   Ht 5' 4.5" (1.638 m)   Wt 193 lb (87.5 kg) Comment: pt reported 1 week ago in rehab hosp)  SpO2 95%   BMI 32.62 kg/m  General: Fatigued appearing 79 year old female in no acute distress. Head: Normocephalic and atraumatic. Eyes: No scleral icterus. Conjunctiva pink . Ears: Normal auditory acuity. Mouth: Dentition intact. No ulcers or lesions.  Lungs: Clear throughout to auscultation. Heart: Regular rate and rhythm, no murmur. Abdomen: Soft, protuberant. Nontender and nondistended. No ascites. No masses or hepatomegaly. Normal bowel sounds x 4 quadrants.  Rectal: Deferred.  Musculoskeletal: Symmetrical with no gross deformities. Extremities: No edema. Neurological: Alert oriented x 4. No focal deficits. No asterixis.  Psychological: Alert and cooperative. Normal mood and affect  Assessment and Recommendations:  5. 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 Na+ 139. Cr. 1.12 down from 1.17. Alk phos 144. AST 89. ALT 18. T.  Bili 1.4.  -CBC, BMP and PT/INR next week -Continue Furosemide 71m daily, to  reduce dose if repeat BMP shows persistent hypokalemia or rising Cr level   -Continue Spironolactone 12.71m daily for now -Follow up in office with Dr. JArdis Hughsin 6 weeks  2. Portal vein thrombosis. Eliquis discontinued secondary to GIB. -Continue follow up with Dr. EMarin Olp-Re-image portal vein in 2 months  3. Acute anemia secondary to GIB most likely due to small bowel AVMs. Hg 10.4 down from 10.7.  -Patient to call our office if she develops black stools or obvious rectal bleeding. -Continue Protonix 414mpo bid   -Repeat CBC in 1 week   4. Hepatic encephalopathy. Lactulose not tolerated. No obvious sings of encephalopathy at this time.    -Recommend Xifaxan if encephalopathy recurs   5. CAD, stent x 2. Plavix discontinued 12/2019 -Continue follow up with cardiology  6. Hypokalemia. K + 3.3. Patient refuses KCL tablet (she has difficulty swallowing the large KCL tablet).  -She will eat 1 banana daily x 3, repeat BMP next week -Spironolactone 12.30m42maily as previously prescribed (husband stated he erroneously was giving wife Spironolactone 230m58mtab full tab daily).     Today's consult including 55 minutes face to face time with patient and husband including extensive review of records, exam and plan.

## 2020-03-14 ENCOUNTER — Telehealth: Payer: Self-pay | Admitting: Internal Medicine

## 2020-03-14 ENCOUNTER — Telehealth: Payer: Self-pay

## 2020-03-14 DIAGNOSIS — D471 Chronic myeloproliferative disease: Secondary | ICD-10-CM | POA: Diagnosis not present

## 2020-03-14 DIAGNOSIS — I2510 Atherosclerotic heart disease of native coronary artery without angina pectoris: Secondary | ICD-10-CM | POA: Diagnosis not present

## 2020-03-14 DIAGNOSIS — N183 Chronic kidney disease, stage 3 unspecified: Secondary | ICD-10-CM | POA: Diagnosis not present

## 2020-03-14 DIAGNOSIS — K7460 Unspecified cirrhosis of liver: Secondary | ICD-10-CM | POA: Diagnosis not present

## 2020-03-14 DIAGNOSIS — R627 Adult failure to thrive: Secondary | ICD-10-CM | POA: Diagnosis not present

## 2020-03-14 DIAGNOSIS — I81 Portal vein thrombosis: Secondary | ICD-10-CM | POA: Diagnosis not present

## 2020-03-14 DIAGNOSIS — I130 Hypertensive heart and chronic kidney disease with heart failure and stage 1 through stage 4 chronic kidney disease, or unspecified chronic kidney disease: Secondary | ICD-10-CM | POA: Diagnosis not present

## 2020-03-14 DIAGNOSIS — K5521 Angiodysplasia of colon with hemorrhage: Secondary | ICD-10-CM | POA: Diagnosis not present

## 2020-03-14 DIAGNOSIS — M6281 Muscle weakness (generalized): Secondary | ICD-10-CM | POA: Diagnosis not present

## 2020-03-14 DIAGNOSIS — I5032 Chronic diastolic (congestive) heart failure: Secondary | ICD-10-CM | POA: Diagnosis not present

## 2020-03-14 DIAGNOSIS — E1122 Type 2 diabetes mellitus with diabetic chronic kidney disease: Secondary | ICD-10-CM | POA: Diagnosis not present

## 2020-03-14 NOTE — Telephone Encounter (Signed)
Please advise.  We have not seen her since April of last year.

## 2020-03-14 NOTE — Telephone Encounter (Signed)
I cannot recommend any diabetic regimen without seeing her...  Lactulose is not prescribed by me.

## 2020-03-14 NOTE — Telephone Encounter (Signed)
I returned [name]'s call and let him know we have not seen her in over a year nor have we had any information regarding her current insulin. I did see while in the hospital they had a sliding scale, he said the discharge papers had that crossed off and not sure what the extend care stay were giving the patient.  Is a virtual visit appropriate?

## 2020-03-14 NOTE — Telephone Encounter (Signed)
Amanda [name] from [place] called to inform us that the paPatient was in the hospital and was release to [facility]. Now she is home. Pt is having vertigo and anxiety. She is afraid of falling. Would like for [place] to discuss these issues at her next appointment.  Verbal orders needed:  -PT 1x week 2, 2 xweek 3, 3x week 5 -Home Health aid 2 week 3 for bathing -Referral for nursing for medication and disease  management for diabetes -OT eval -Medical social worker for community resources.   Herbert Deaner [phone]

## 2020-03-14 NOTE — Telephone Encounter (Signed)
Herbert Deaner from [place] called to get dosages, and instructions for patients diabetic medications and Lactulose.  She is just now returning to the home from extended care stay.  Return call to [phone]

## 2020-03-14 NOTE — Telephone Encounter (Signed)
Yes, if she can do it

## 2020-03-17 NOTE — Progress Notes (Signed)
I agree with the above note, plan 

## 2020-03-18 ENCOUNTER — Telehealth: Payer: Self-pay | Admitting: Gastroenterology

## 2020-03-18 ENCOUNTER — Other Ambulatory Visit: Payer: Self-pay

## 2020-03-18 ENCOUNTER — Ambulatory Visit (INDEPENDENT_AMBULATORY_CARE_PROVIDER_SITE_OTHER): Payer: Medicare HMO | Admitting: Adult Health

## 2020-03-18 ENCOUNTER — Encounter: Payer: Self-pay | Admitting: Adult Health

## 2020-03-18 VITALS — BP 114/54 | HR 75 | Temp 98.3°F

## 2020-03-18 DIAGNOSIS — I5032 Chronic diastolic (congestive) heart failure: Secondary | ICD-10-CM | POA: Diagnosis not present

## 2020-03-18 DIAGNOSIS — R627 Adult failure to thrive: Secondary | ICD-10-CM

## 2020-03-18 DIAGNOSIS — R600 Localized edema: Secondary | ICD-10-CM

## 2020-03-18 DIAGNOSIS — K2100 Gastro-esophageal reflux disease with esophagitis, without bleeding: Secondary | ICD-10-CM | POA: Diagnosis not present

## 2020-03-18 DIAGNOSIS — I130 Hypertensive heart and chronic kidney disease with heart failure and stage 1 through stage 4 chronic kidney disease, or unspecified chronic kidney disease: Secondary | ICD-10-CM | POA: Diagnosis not present

## 2020-03-18 DIAGNOSIS — R630 Anorexia: Secondary | ICD-10-CM | POA: Diagnosis not present

## 2020-03-18 DIAGNOSIS — R531 Weakness: Secondary | ICD-10-CM

## 2020-03-18 DIAGNOSIS — I2510 Atherosclerotic heart disease of native coronary artery without angina pectoris: Secondary | ICD-10-CM | POA: Diagnosis not present

## 2020-03-18 DIAGNOSIS — R2681 Unsteadiness on feet: Secondary | ICD-10-CM

## 2020-03-18 DIAGNOSIS — F419 Anxiety disorder, unspecified: Secondary | ICD-10-CM | POA: Diagnosis not present

## 2020-03-18 DIAGNOSIS — K5521 Angiodysplasia of colon with hemorrhage: Secondary | ICD-10-CM | POA: Diagnosis not present

## 2020-03-18 DIAGNOSIS — K7460 Unspecified cirrhosis of liver: Secondary | ICD-10-CM | POA: Diagnosis not present

## 2020-03-18 DIAGNOSIS — E1122 Type 2 diabetes mellitus with diabetic chronic kidney disease: Secondary | ICD-10-CM | POA: Diagnosis not present

## 2020-03-18 DIAGNOSIS — N183 Chronic kidney disease, stage 3 unspecified: Secondary | ICD-10-CM | POA: Diagnosis not present

## 2020-03-18 DIAGNOSIS — I81 Portal vein thrombosis: Secondary | ICD-10-CM | POA: Diagnosis not present

## 2020-03-18 MED ORDER — PANTOPRAZOLE SODIUM 40 MG PO TBEC: 40.0000 mg | DELAYED_RELEASE_TABLET | Freq: Two times a day (BID) | ORAL | 1 refills | Status: AC

## 2020-03-18 MED ORDER — CITALOPRAM HYDROBROMIDE 10 MG PO TABS: 10.0000 mg | ORAL_TABLET | Freq: Every day | ORAL | 0 refills | Status: DC

## 2020-03-18 NOTE — Telephone Encounter (Signed)
Ok for verbal orders ?

## 2020-03-18 NOTE — Patient Instructions (Signed)
I am going to start you on a medication called Celexa to help with the anxiety   You have to start working with physical therapy if you want to stay out of the hospital.   Please follow up in 30 days or sooner if needed

## 2020-03-18 NOTE — Telephone Encounter (Signed)
Pt saw [name] on 5/27

## 2020-03-18 NOTE — Telephone Encounter (Signed)
Verbal authorization given.  Nothing further needed.

## 2020-03-18 NOTE — Progress Notes (Signed)
Subjective:    Patient ID: Amanda [name], female    DOB: [birth year], 79 y.o.   MRN: [id number]  HPI 79 year old female who  has a past medical history of Allergic rhinitis (01/23/2016), C. difficile colitis, CAD (coronary artery disease), Cancer ([place]), Candidiasis of skin (09/30/2014), Cirrhosis ([place]), Depression, Diabetes mellitus (2008), Gout, Herpes simplex, Hyperglycemia (05/31/2013), Hyperlipidemia, Hypertension, Myocardial infarction [facility]), Neuromuscular disorder ([place]), Obesity, Polycythemia, and Psoriasis.  TCM Visit   Admit Date 02/17/2020 Discharge Date 02/21/2020 Discharge from [facility] 03/12/2020  Is done on his third admission in the last two months.  For her most recent admission she presented from rehab with the chief complaint of becoming increasingly weak and had started to have black stools again.  On arrival to the ER her hemoglobin was 6.1.  She was admitted and initiated 3 units of PRBCs.  She had a EGD on 5 4 which found nonbleeding angiectasis to what the distal duodenum and jejunum, she was treated with APC.  She had repeat imaging for portal vein thrombosis which failed nearly resolved thrombosis.  Eliquis was subsequently stopped.   She was discharged to [facility] rehab on 02/21/2020 with Protonix 40 mg twice daily.  Spironolactone 12.5 mg p.o. daily.  Eliquis was not restarted  He was seen for follow-up by gastroenterology on 03/13/2020.  At this time she continued to feel fatigued but appetite was slowly improving.  Her weight was stable at 190 to 193 pounds.  She endorsed passing a normal brown formed stool once daily.  She denied rectal bleeding or melena.  He continued to have lower extremity swelling which was decreasing with spironolactone.  Lab work was done with gastroenterology which showed her hemoglobin down from 10.7-10.4.  She was advised to continue Protonix 40 mg twice daily and repeat CBC in 1 week.  Today she reports she is doing better since being discharged from the  hospital.  She is working with physical therapy at home but her husband believes that the anxiety of falling is impeding her progress with physical therapy.  She continues to have bouts of vertigo and also feels as though this is a barrier to her physical therapy.  Her husband reports that over the last few days her appetite has decreased and she is not eating much throughout the day where she is staying hydrated.  He reports that he has forgotten to give her the evening Remeron.  The lower extremity edema continues to improve since spironolactone has been prescribed.   Review of Systems See HPI   Past Medical History:  Diagnosis Date  . Allergic rhinitis 01/23/2016  . C. difficile colitis   . CAD (coronary artery disease)    a. s/p STEMI in 01/2015 with 95% LCx stenosis and distal 80% LCx stenosis (DESx2 placed)  . Cancer ([place])    SKIN  . Candidiasis of skin 09/30/2014  . Cirrhosis ([place])   . Depression   . Diabetes mellitus 2008  . Gout   . Herpes simplex   . Hyperglycemia 05/31/2013  . Hyperlipidemia   . Hypertension   . Myocardial infarction ([place])   . 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 Obesity   . Polycythemia    Dr. Elease Hashimoto- [facility]  . Psoriasis     Social History   Socioeconomic History  . Marital status: Married    Spouse name: [name]  . Number of children: 2  . Years of education: 2 yr colle  . Highest  education level: Not on file  Occupational History  . Occupation: housewife  Tobacco Use  . Smoking status: Former Smoker    Years: 48.00    Types: Cigarettes    Quit date: 10/18/1986    Years since quitting: 33.4  . Smokeless tobacco: Never Used  Substance and Sexual Activity  . Alcohol use: No    Alcohol/week: 0.0 standard drinks  . Drug use: No  . Sexual activity: Yes    Partners: Male    Birth control/protection: None  Other Topics Concern  . Not on file  Social History Narrative   2 caffeine drink per day.  No regular exercise.      Retired from the bank   2 children (Daughter and a son) son has morbid obesity   2 grandchildren   3 great grandchildren   Social Determinants of Radio broadcast assistant Strain:   . Difficulty of Paying Living Expenses:   Food Insecurity:   . Worried About Charity fundraiser in the Last Year:   . Arboriculturist in the Last Year:   Transportation Needs:   . Film/video editor (Medical):   Marland Kitchen Lack of Transportation (Non-Medical):   Physical Activity:   . Days of Exercise per Week:   . Minutes of Exercise per Session:   Stress:   . Feeling of Stress :   Social Connections:   . Frequency of Communication with Friends and Family:   . Frequency of Social Gatherings with Friends and Family:   . Attends Religious Services:   . Active Member of Clubs or Organizations:   . Attends Archivist Meetings:   Marland Kitchen Marital Status:   Intimate Partner Violence:   . Fear of Current or Ex-Partner:   . Emotionally Abused:   Marland Kitchen Physically Abused:   . Sexually Abused:     Past Surgical History:  Procedure Laterality Date  . ANKLE FRACTURE SURGERY    . BIOPSY  05/26/2018   Procedure: BIOPSY;  Surgeon: [name], MD;  Location: [facility] ENDOSCOPY;  Service: Endoscopy;;  . BIOPSY  02/19/2020   Procedure: BIOPSY;  Surgeon: Thornton Park, MD;  Location: [place];  Service: Gastroenterology;;  . BREAST SURGERY Left    milk duct  . CATARACT EXTRACTION, BILATERAL    . COLONOSCOPY    . ENTEROSCOPY N/A 02/19/2020   Procedure: ENTEROSCOPY;  Surgeon: Thornton Park, MD;  Location: [place];  Service: Gastroenterology;  Laterality: N/A;  . ESOPHAGOGASTRODUODENOSCOPY N/A 05/26/2018   Procedure: ESOPHAGOGASTRODUODENOSCOPY (EGD);  Surgeon: [name], MD;  Location: [facility] ENDOSCOPY;  Service: Endoscopy;  Laterality: N/A;  . ESOPHAGOGASTRODUODENOSCOPY (EGD) WITH PROPOFOL N/A 01/21/2020   Procedure: ESOPHAGOGASTRODUODENOSCOPY (EGD) WITH PROPOFOL;  Surgeon: Yetta Flock, MD;   Location: [place];  Service: Gastroenterology;  Laterality: N/A;  . HEMOSTASIS CLIP PLACEMENT  01/21/2020   Procedure: HEMOSTASIS CLIP PLACEMENT;  Surgeon: Yetta Flock, MD;  Location: [place] ENDOSCOPY;  Service: Gastroenterology;;  . HOT HEMOSTASIS N/A 01/21/2020   Procedure: HOT HEMOSTASIS (ARGON PLASMA COAGULATION/BICAP);  Surgeon: Yetta Flock, MD;  Location: [facility] ENDOSCOPY;  Service: Gastroenterology;  Laterality: N/A;  . HOT HEMOSTASIS N/A 02/19/2020   Procedure: HOT HEMOSTASIS (ARGON PLASMA COAGULATION/BICAP);  Surgeon: Thornton Park, MD;  Location: [place];  Service: Gastroenterology;  Laterality: N/A;  . IR IMAGING GUIDED PORT INSERTION  08/30/2019  . IR PARACENTESIS  05/25/2018  . LEFT HEART CATHETERIZATION WITH CORONARY ANGIOGRAM N/A 01/27/2015   Procedure: LEFT HEART CATHETERIZATION WITH CORONARY ANGIOGRAM;  Surgeon: [name], MD;  Location: [facility] CATH LAB;  Service: Cardiovascular;  Laterality: N/A;  . TONSILLECTOMY    . TOTAL ABDOMINAL HYSTERECTOMY W/ BILATERAL SALPINGOOPHORECTOMY     for heavy periods with appendectomy    Family History  Problem Relation Age of Onset  . Diabetes Mother   . Heart disease Mother        CAD  . Hyperlipidemia Mother   . Hypertension Mother   . Kidney disease Mother   . Kidney disease Father   . Breast cancer Maternal Aunt   . Heart disease Maternal Grandmother   . Heart disease Other        maternal aunts and uncles  . Colon cancer Neg Hx   . Esophageal cancer Neg Hx     Allergies  Allergen Reactions  . Doxycycline Hives, Swelling and Rash  . Penicillins Swelling    Has patient had a PCN reaction causing immediate rash, facial/tongue/throat swelling, SOB or lightheadedness with hypotension: No Has patient had a PCN reaction causing severe rash involving mucus membranes or skin necrosis: No Has patient had a PCN reaction that required hospitalization: No Has patient had a PCN reaction occurring within the last 10  years: No If all of the above answers are "NO", then may proceed with Cephalosporin use.  Marland Kitchen Lisinopril Cough  . Tape Hives  . Latex Hives, Itching and Rash    Current Outpatient Medications on File Prior to Visit  Medication Sig Dispense Refill  . Blood Glucose Monitoring Suppl (ACCU-CHEK GUIDE) w/Device KIT 1 kit by Does not apply route as directed. 1 kit 0  . Dulaglutide (TRULICITY) 1.5 PF/7.9KW SOPN Inject 1.5 mg into the skin once a week. Friday    . ezetimibe (ZETIA) 10 MG tablet Take 1 tablet (10 mg total) by mouth daily. 90 tablet 1  . furosemide (LASIX) 40 MG tablet TAKE 1 TABLET(40 MG) BY MOUTH DAILY (Patient taking differently: Take 40 mg by mouth daily. ) 90 tablet 2  . glucose blood (ACCU-CHEK GUIDE) test strip Use to check blood sugars 3 times daily 100 each 12  . insulin aspart (NOVOLOG) 100 UNIT/ML injection Inject 0-15 Units into the skin 3 (three) times daily with meals. CBG 70 - 120: 0 units CBG 121 - 150: 2 units CBG 151 - 200: 3 units CBG 201 - 250: 5 units CBG 251 - 300: 8 units CBG 301 - 350: 11 units CBG 351 - 400: 15 units (Patient taking differently: Inject 90 Units into the skin daily. ) 10 mL 0  . latanoprost (XALATAN) 0.005 % ophthalmic solution Place 1 drop into both eyes at bedtime.     . lidocaine-prilocaine (EMLA) cream Apply 1 application topically as needed. Please apply EMLA over the Port-A-Cath site 1 hour prior to coming to our office. (Patient taking differently: Apply 1 application topically daily as needed (for port). ) 30 g 5  . mirtazapine (REMERON) 15 MG tablet Take 1 tablet (15 mg total) by mouth at bedtime. 90 tablet 0  . ONETOUCH VERIO test strip USE TO TEST BLOOD SUGAR THREE TIMES DAILY 300 each 2  . ruxolitinib phosphate (JAKAFI) 25 MG tablet Take 25 mg by mouth 2 (two) times daily.    Marland Kitchen spironolactone (ALDACTONE) 25 MG tablet Take 0.5 tablets (12.5 mg total) by mouth daily. (Patient taking differently: Take 25 mg by mouth daily. ) 15 tablet 0   . timolol (TIMOPTIC) 0.5 % ophthalmic solution Place 1 drop into both eyes daily.     Marland Kitchen  triamcinolone cream (KENALOG) 0.1 % Apply 1 application topically 2 (two) times daily. (Patient taking differently: Apply 1 application topically 2 (two) times daily as needed (for rask skin). ) 30 g 0  . clopidogrel (PLAVIX) 75 MG tablet     . lactulose (CHRONULAC) 10 GM/15ML solution Take 30 mLs (20 g total) by mouth 2 (two) times daily. (Patient not taking: Reported on 03/13/2020) 236 mL 0   No current facility-administered medications on file prior to visit.    BP (!) 114/54   Pulse 75   Temp 98.3 F (36.8 C)   SpO2 96%       Objective:   Physical Exam Vitals and nursing note reviewed.  Constitutional:      Appearance: Normal appearance. She is obese.  Cardiovascular:     Rate and Rhythm: Normal rate and regular rhythm.     Pulses: Normal pulses.     Heart sounds: Normal heart sounds.  Pulmonary:     Effort: Pulmonary effort is normal.     Breath sounds: Normal breath sounds.  Musculoskeletal:        General: No swelling, tenderness or deformity. Normal range of motion.     Right lower leg: No edema.     Left lower leg: No edema.  Skin:    General: Skin is warm and dry.     Capillary Refill: Capillary refill takes less than 2 seconds.  Neurological:     General: No focal deficit present.     Mental Status: She is alert and oriented to person, place, and time.  Psychiatric:        Mood and Affect: Mood normal.        Behavior: Behavior normal.        Thought Content: Thought content normal.        Judgment: Judgment normal.       Assessment & Plan:  1. Anxiety -We will prescribe low-dose Celexa to see if this helps with her anxiety in order to get her moving more and become less sedentary. - citalopram (CELEXA) 10 MG tablet; Take 1 tablet (10 mg total) by mouth daily.  Dispense: 30 tablet; Refill: 0  2. 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  Dispense: 180 tablet; Refill: 1  3. Failure to thrive in adult -Her husband will start giving her Remeron and hopefully Celexa will help increase her appetite a little bit as well.  I want her to start eating and drinking more as this will likely help with overall improvement as well as vertigo symptoms  4. Gait instability - work with PT   5. Loss of appetite  - citalopram (CELEXA) 10 MG tablet; Take 1 tablet (10 mg total) by mouth daily.  Dispense: 30 tablet; Refill: 0  6. Generalized weakness -Needs to increase oral consumption.  Work with physical therapy as much as possible and advised to do exercises at home when physical therapy is not there.    7. Lower extremity edema - Resolved   [name], NP

## 2020-03-18 NOTE — Telephone Encounter (Signed)
Skilled nurse [name] wanted to discuss pt medication list.  We discussed what was mentioned in the last office visit note with [facility].  He will call with any further questions.

## 2020-03-19 DIAGNOSIS — I81 Portal vein thrombosis: Secondary | ICD-10-CM | POA: Diagnosis not present

## 2020-03-19 DIAGNOSIS — I2510 Atherosclerotic heart disease of native coronary artery without angina pectoris: Secondary | ICD-10-CM | POA: Diagnosis not present

## 2020-03-19 DIAGNOSIS — I130 Hypertensive heart and chronic kidney disease with heart failure and stage 1 through stage 4 chronic kidney disease, or unspecified chronic kidney disease: Secondary | ICD-10-CM | POA: Diagnosis not present

## 2020-03-19 DIAGNOSIS — K5521 Angiodysplasia of colon with hemorrhage: Secondary | ICD-10-CM | POA: Diagnosis not present

## 2020-03-19 DIAGNOSIS — R627 Adult failure to thrive: Secondary | ICD-10-CM | POA: Diagnosis not present

## 2020-03-19 DIAGNOSIS — I5032 Chronic diastolic (congestive) heart failure: Secondary | ICD-10-CM | POA: Diagnosis not present

## 2020-03-19 DIAGNOSIS — N183 Chronic kidney disease, stage 3 unspecified: Secondary | ICD-10-CM | POA: Diagnosis not present

## 2020-03-19 DIAGNOSIS — E1122 Type 2 diabetes mellitus with diabetic chronic kidney disease: Secondary | ICD-10-CM | POA: Diagnosis not present

## 2020-03-19 DIAGNOSIS — K7460 Unspecified cirrhosis of liver: Secondary | ICD-10-CM | POA: Diagnosis not present

## 2020-03-19 NOTE — Telephone Encounter (Signed)
Scheduled patient 03/28/20

## 2020-03-19 NOTE — Telephone Encounter (Signed)
Please contact patient to schedule follow up with Dr. Cruzita Lederer.

## 2020-03-20 DIAGNOSIS — N183 Chronic kidney disease, stage 3 unspecified: Secondary | ICD-10-CM | POA: Diagnosis not present

## 2020-03-20 DIAGNOSIS — I81 Portal vein thrombosis: Secondary | ICD-10-CM | POA: Diagnosis not present

## 2020-03-20 DIAGNOSIS — I2510 Atherosclerotic heart disease of native coronary artery without angina pectoris: Secondary | ICD-10-CM | POA: Diagnosis not present

## 2020-03-20 DIAGNOSIS — I130 Hypertensive heart and chronic kidney disease with heart failure and stage 1 through stage 4 chronic kidney disease, or unspecified chronic kidney disease: Secondary | ICD-10-CM | POA: Diagnosis not present

## 2020-03-20 DIAGNOSIS — I5032 Chronic diastolic (congestive) heart failure: Secondary | ICD-10-CM | POA: Diagnosis not present

## 2020-03-20 DIAGNOSIS — K5521 Angiodysplasia of colon with hemorrhage: Secondary | ICD-10-CM | POA: Diagnosis not present

## 2020-03-20 DIAGNOSIS — E1122 Type 2 diabetes mellitus with diabetic chronic kidney disease: Secondary | ICD-10-CM | POA: Diagnosis not present

## 2020-03-20 DIAGNOSIS — R627 Adult failure to thrive: Secondary | ICD-10-CM | POA: Diagnosis not present

## 2020-03-20 DIAGNOSIS — K7460 Unspecified cirrhosis of liver: Secondary | ICD-10-CM | POA: Diagnosis not present

## 2020-03-21 ENCOUNTER — Telehealth: Payer: Self-pay | Admitting: Adult Health

## 2020-03-21 DIAGNOSIS — N183 Chronic kidney disease, stage 3 unspecified: Secondary | ICD-10-CM | POA: Diagnosis not present

## 2020-03-21 DIAGNOSIS — I130 Hypertensive heart and chronic kidney disease with heart failure and stage 1 through stage 4 chronic kidney disease, or unspecified chronic kidney disease: Secondary | ICD-10-CM | POA: Diagnosis not present

## 2020-03-21 DIAGNOSIS — K5521 Angiodysplasia of colon with hemorrhage: Secondary | ICD-10-CM | POA: Diagnosis not present

## 2020-03-21 DIAGNOSIS — K7460 Unspecified cirrhosis of liver: Secondary | ICD-10-CM | POA: Diagnosis not present

## 2020-03-21 DIAGNOSIS — R627 Adult failure to thrive: Secondary | ICD-10-CM | POA: Diagnosis not present

## 2020-03-21 DIAGNOSIS — I81 Portal vein thrombosis: Secondary | ICD-10-CM | POA: Diagnosis not present

## 2020-03-21 DIAGNOSIS — I2510 Atherosclerotic heart disease of native coronary artery without angina pectoris: Secondary | ICD-10-CM | POA: Diagnosis not present

## 2020-03-21 DIAGNOSIS — E1122 Type 2 diabetes mellitus with diabetic chronic kidney disease: Secondary | ICD-10-CM | POA: Diagnosis not present

## 2020-03-21 DIAGNOSIS — I5032 Chronic diastolic (congestive) heart failure: Secondary | ICD-10-CM | POA: Diagnosis not present

## 2020-03-21 NOTE — Telephone Encounter (Signed)
ok 

## 2020-03-21 NOTE — Telephone Encounter (Signed)
Left detailed message oking verbal orders

## 2020-03-21 NOTE — Telephone Encounter (Signed)
Please advise 

## 2020-03-21 NOTE — Telephone Encounter (Signed)
This message is for Dr. Elease Hashimoto:  Amanda [name] with [name] is calling in to get verbal consent stating the pts spouse declined OT evaluation this week Wednesday 6/2, Thursday 6/3, Friday 6/4, Monday 6/7 and Tuesday 6/8 due to multiple appointments between the two of them and would like to see if they could resume OT evaluation on Wednesday 03/26/2020.

## 2020-03-24 ENCOUNTER — Other Ambulatory Visit: Payer: Self-pay

## 2020-03-24 ENCOUNTER — Encounter: Payer: Self-pay | Admitting: *Deleted

## 2020-03-24 ENCOUNTER — Telehealth: Payer: Self-pay | Admitting: Nurse Practitioner

## 2020-03-24 DIAGNOSIS — K7460 Unspecified cirrhosis of liver: Secondary | ICD-10-CM

## 2020-03-24 MED ORDER — XIFAXAN 550 MG PO TABS: 550.0000 mg | ORAL_TABLET | Freq: Two times a day (BID) | ORAL | 1 refills | Status: DC

## 2020-03-24 NOTE — Telephone Encounter (Signed)
Beth, pls call pt's husband. She was due to have repeat CBC, BMP and PT/INR one week after her last office appointment which was on 5/27. Pls renew lab order to include CBC, hepatic panel, PT/INR and ammonia level. Pls send RX for Xifaxan 519m one po bid  # 60. 1 additional refill. If her husband assesses any further decline then she should be seen in office. Thx

## 2020-03-24 NOTE — Telephone Encounter (Signed)
[name], The spouse of the patient calls with concerns about her "fogginess" and decreased appetite. She has been off of the Lactulose for about 21 days now. He does not want to put her back on it. Inquiring about other options. The patient seems more alert today per his report, but has not been. He cannot say that she has been more sleepy that her normal since "she likes to take a nap." Other options?

## 2020-03-24 NOTE — Telephone Encounter (Signed)
[name] reported that pt is forgetful and cannot take lactulose because it causes her to have diarrhea.  Please advise.

## 2020-03-24 NOTE — Telephone Encounter (Signed)
Patient's spouse agrees to this plan. He will bring her by this week. Rx sent to [facility]. It will require PA. Patient is on other medication through an assistance program. She may qualify for assistance with the Xifaxan.

## 2020-03-26 ENCOUNTER — Telehealth: Payer: Self-pay | Admitting: Adult Health

## 2020-03-26 DIAGNOSIS — I2510 Atherosclerotic heart disease of native coronary artery without angina pectoris: Secondary | ICD-10-CM | POA: Diagnosis not present

## 2020-03-26 DIAGNOSIS — R627 Adult failure to thrive: Secondary | ICD-10-CM | POA: Diagnosis not present

## 2020-03-26 DIAGNOSIS — I130 Hypertensive heart and chronic kidney disease with heart failure and stage 1 through stage 4 chronic kidney disease, or unspecified chronic kidney disease: Secondary | ICD-10-CM | POA: Diagnosis not present

## 2020-03-26 DIAGNOSIS — I5032 Chronic diastolic (congestive) heart failure: Secondary | ICD-10-CM | POA: Diagnosis not present

## 2020-03-26 DIAGNOSIS — N183 Chronic kidney disease, stage 3 unspecified: Secondary | ICD-10-CM | POA: Diagnosis not present

## 2020-03-26 DIAGNOSIS — I81 Portal vein thrombosis: Secondary | ICD-10-CM | POA: Diagnosis not present

## 2020-03-26 DIAGNOSIS — K7460 Unspecified cirrhosis of liver: Secondary | ICD-10-CM | POA: Diagnosis not present

## 2020-03-26 DIAGNOSIS — E1122 Type 2 diabetes mellitus with diabetic chronic kidney disease: Secondary | ICD-10-CM | POA: Diagnosis not present

## 2020-03-26 DIAGNOSIS — K5521 Angiodysplasia of colon with hemorrhage: Secondary | ICD-10-CM | POA: Diagnosis not present

## 2020-03-26 NOTE — Telephone Encounter (Signed)
Denise from [facility] called to let Tommi Rumps know that the patient had severe diarrhea yesterday and she took an imodium yesterday and seems to be helping so she needs this added to the patients medication list.

## 2020-03-26 NOTE — Telephone Encounter (Signed)
Added to medication list

## 2020-03-26 NOTE — Telephone Encounter (Signed)
ok 

## 2020-03-26 NOTE — Telephone Encounter (Signed)
Herbert Deaner from [facility] stated the pt had 2 visits this week and canceled both due to having too many appointments. He wanted to make PCP aware.   [name] can be reached at [phone] to leave a detailed message per [name]

## 2020-03-28 ENCOUNTER — Encounter: Payer: Self-pay | Admitting: Internal Medicine

## 2020-03-28 ENCOUNTER — Encounter (HOSPITAL_COMMUNITY): Payer: Self-pay | Admitting: Internal Medicine

## 2020-03-28 ENCOUNTER — Inpatient Hospital Stay (HOSPITAL_COMMUNITY)
Admit: 2020-03-28 | Discharge: 2020-04-02 | DRG: 638 | Disposition: A | Discharge status: Home Health | Payer: Medicare HMO | Source: Ambulatory Visit | Attending: Internal Medicine | Admitting: Internal Medicine

## 2020-03-28 ENCOUNTER — Ambulatory Visit (INDEPENDENT_AMBULATORY_CARE_PROVIDER_SITE_OTHER): Payer: Medicare HMO | Admitting: Internal Medicine

## 2020-03-28 ENCOUNTER — Emergency Department (HOSPITAL_COMMUNITY): Payer: Medicare HMO

## 2020-03-28 ENCOUNTER — Other Ambulatory Visit: Payer: Self-pay

## 2020-03-28 VITALS — BP 120/82 | HR 84 | Ht 64.5 in | Wt 192.0 lb

## 2020-03-28 DIAGNOSIS — I10 Essential (primary) hypertension: Secondary | ICD-10-CM | POA: Diagnosis present

## 2020-03-28 DIAGNOSIS — K219 Gastro-esophageal reflux disease without esophagitis: Secondary | ICD-10-CM | POA: Diagnosis present

## 2020-03-28 DIAGNOSIS — G4733 Obstructive sleep apnea (adult) (pediatric): Secondary | ICD-10-CM

## 2020-03-28 DIAGNOSIS — D45 Polycythemia vera: Secondary | ICD-10-CM | POA: Diagnosis present

## 2020-03-28 DIAGNOSIS — I491 Atrial premature depolarization: Secondary | ICD-10-CM | POA: Diagnosis not present

## 2020-03-28 DIAGNOSIS — Z8619 Personal history of other infectious and parasitic diseases: Secondary | ICD-10-CM

## 2020-03-28 DIAGNOSIS — K7460 Unspecified cirrhosis of liver: Secondary | ICD-10-CM | POA: Diagnosis present

## 2020-03-28 DIAGNOSIS — I519 Heart disease, unspecified: Secondary | ICD-10-CM | POA: Diagnosis present

## 2020-03-28 DIAGNOSIS — I5032 Chronic diastolic (congestive) heart failure: Secondary | ICD-10-CM | POA: Diagnosis not present

## 2020-03-28 DIAGNOSIS — N179 Acute kidney failure, unspecified: Secondary | ICD-10-CM | POA: Diagnosis present

## 2020-03-28 DIAGNOSIS — E1142 Type 2 diabetes mellitus with diabetic polyneuropathy: Secondary | ICD-10-CM | POA: Diagnosis present

## 2020-03-28 DIAGNOSIS — R42 Dizziness and giddiness: Secondary | ICD-10-CM | POA: Diagnosis not present

## 2020-03-28 DIAGNOSIS — F411 Generalized anxiety disorder: Secondary | ICD-10-CM | POA: Diagnosis present

## 2020-03-28 DIAGNOSIS — Z8673 Personal history of transient ischemic attack (TIA), and cerebral infarction without residual deficits: Secondary | ICD-10-CM

## 2020-03-28 DIAGNOSIS — E860 Dehydration: Secondary | ICD-10-CM | POA: Diagnosis present

## 2020-03-28 DIAGNOSIS — E1122 Type 2 diabetes mellitus with diabetic chronic kidney disease: Secondary | ICD-10-CM | POA: Diagnosis present

## 2020-03-28 DIAGNOSIS — E876 Hypokalemia: Secondary | ICD-10-CM | POA: Diagnosis not present

## 2020-03-28 DIAGNOSIS — R8271 Bacteriuria: Secondary | ICD-10-CM | POA: Diagnosis present

## 2020-03-28 DIAGNOSIS — Z833 Family history of diabetes mellitus: Secondary | ICD-10-CM

## 2020-03-28 DIAGNOSIS — R404 Transient alteration of awareness: Secondary | ICD-10-CM | POA: Diagnosis not present

## 2020-03-28 DIAGNOSIS — R5381 Other malaise: Secondary | ICD-10-CM | POA: Diagnosis present

## 2020-03-28 DIAGNOSIS — Z20822 Contact with and (suspected) exposure to covid-19: Secondary | ICD-10-CM | POA: Diagnosis present

## 2020-03-28 DIAGNOSIS — E785 Hyperlipidemia, unspecified: Secondary | ICD-10-CM | POA: Diagnosis present

## 2020-03-28 DIAGNOSIS — I130 Hypertensive heart and chronic kidney disease with heart failure and stage 1 through stage 4 chronic kidney disease, or unspecified chronic kidney disease: Secondary | ICD-10-CM | POA: Diagnosis present

## 2020-03-28 DIAGNOSIS — E162 Hypoglycemia, unspecified: Secondary | ICD-10-CM

## 2020-03-28 DIAGNOSIS — E11319 Type 2 diabetes mellitus with unspecified diabetic retinopathy without macular edema: Secondary | ICD-10-CM | POA: Diagnosis present

## 2020-03-28 DIAGNOSIS — D72829 Elevated white blood cell count, unspecified: Secondary | ICD-10-CM | POA: Diagnosis not present

## 2020-03-28 DIAGNOSIS — I493 Ventricular premature depolarization: Secondary | ICD-10-CM | POA: Diagnosis present

## 2020-03-28 DIAGNOSIS — R531 Weakness: Secondary | ICD-10-CM

## 2020-03-28 DIAGNOSIS — Z66 Do not resuscitate: Secondary | ICD-10-CM | POA: Diagnosis not present

## 2020-03-28 DIAGNOSIS — N1832 Chronic kidney disease, stage 3b: Secondary | ICD-10-CM | POA: Diagnosis present

## 2020-03-28 DIAGNOSIS — Z7401 Bed confinement status: Secondary | ICD-10-CM | POA: Diagnosis not present

## 2020-03-28 DIAGNOSIS — F32A Depression, unspecified: Secondary | ICD-10-CM | POA: Diagnosis present

## 2020-03-28 DIAGNOSIS — M2550 Pain in unspecified joint: Secondary | ICD-10-CM | POA: Diagnosis not present

## 2020-03-28 DIAGNOSIS — Z794 Long term (current) use of insulin: Secondary | ICD-10-CM

## 2020-03-28 DIAGNOSIS — E669 Obesity, unspecified: Secondary | ICD-10-CM | POA: Diagnosis present

## 2020-03-28 DIAGNOSIS — Z955 Presence of coronary angioplasty implant and graft: Secondary | ICD-10-CM

## 2020-03-28 DIAGNOSIS — K7290 Hepatic failure, unspecified without coma: Secondary | ICD-10-CM | POA: Diagnosis present

## 2020-03-28 DIAGNOSIS — I252 Old myocardial infarction: Secondary | ICD-10-CM

## 2020-03-28 DIAGNOSIS — Z83438 Family history of other disorder of lipoprotein metabolism and other lipidemia: Secondary | ICD-10-CM

## 2020-03-28 DIAGNOSIS — Z6832 Body mass index (BMI) 32.0-32.9, adult: Secondary | ICD-10-CM

## 2020-03-28 DIAGNOSIS — Z8249 Family history of ischemic heart disease and other diseases of the circulatory system: Secondary | ICD-10-CM

## 2020-03-28 DIAGNOSIS — Z86718 Personal history of other venous thrombosis and embolism: Secondary | ICD-10-CM

## 2020-03-28 DIAGNOSIS — I499 Cardiac arrhythmia, unspecified: Secondary | ICD-10-CM | POA: Diagnosis not present

## 2020-03-28 DIAGNOSIS — Z87891 Personal history of nicotine dependence: Secondary | ICD-10-CM

## 2020-03-28 DIAGNOSIS — I4891 Unspecified atrial fibrillation: Secondary | ICD-10-CM | POA: Diagnosis not present

## 2020-03-28 DIAGNOSIS — R000 Tachycardia, unspecified: Secondary | ICD-10-CM | POA: Diagnosis not present

## 2020-03-28 DIAGNOSIS — E11649 Type 2 diabetes mellitus with hypoglycemia without coma: Secondary | ICD-10-CM | POA: Diagnosis not present

## 2020-03-28 DIAGNOSIS — R0689 Other abnormalities of breathing: Secondary | ICD-10-CM | POA: Diagnosis not present

## 2020-03-28 DIAGNOSIS — E161 Other hypoglycemia: Secondary | ICD-10-CM | POA: Diagnosis not present

## 2020-03-28 DIAGNOSIS — I2510 Atherosclerotic heart disease of native coronary artery without angina pectoris: Secondary | ICD-10-CM | POA: Diagnosis present

## 2020-03-28 DIAGNOSIS — Z841 Family history of disorders of kidney and ureter: Secondary | ICD-10-CM

## 2020-03-28 DIAGNOSIS — N1831 Chronic kidney disease, stage 3a: Secondary | ICD-10-CM | POA: Diagnosis not present

## 2020-03-28 DIAGNOSIS — F329 Major depressive disorder, single episode, unspecified: Secondary | ICD-10-CM | POA: Diagnosis present

## 2020-03-28 LAB — CBC WITH DIFFERENTIAL/PLATELET
Abs Immature Granulocytes: 1.33 10*3/uL — ABNORMAL HIGH (ref 0.00–0.07)
Basophils Absolute: 0.2 10*3/uL — ABNORMAL HIGH (ref 0.0–0.1)
Basophils Relative: 1 %
Eosinophils Absolute: 0 10*3/uL (ref 0.0–0.5)
Eosinophils Relative: 0 %
HCT: 42.2 % (ref 36.0–46.0)
Hemoglobin: 13.4 g/dL (ref 12.0–15.0)
Immature Granulocytes: 3 %
Lymphocytes Relative: 8 %
Lymphs Abs: 3 10*3/uL (ref 0.7–4.0)
MCH: 27.9 pg (ref 26.0–34.0)
MCHC: 31.8 g/dL (ref 30.0–36.0)
MCV: 87.7 fL (ref 80.0–100.0)
Monocytes Absolute: 1.6 10*3/uL — ABNORMAL HIGH (ref 0.1–1.0)
Monocytes Relative: 4 %
Neutro Abs: 33.5 10*3/uL — ABNORMAL HIGH (ref 1.7–7.7)
Neutrophils Relative %: 84 %
Platelets: 487 10*3/uL — ABNORMAL HIGH (ref 150–400)
RBC: 4.81 MIL/uL (ref 3.87–5.11)
RDW: 22.1 % — ABNORMAL HIGH (ref 11.5–15.5)
WBC: 39.8 10*3/uL — ABNORMAL HIGH (ref 4.0–10.5)
nRBC: 0.2 % (ref 0.0–0.2)

## 2020-03-28 LAB — SARS CORONAVIRUS 2 BY RT PCR (HOSPITAL ORDER, PERFORMED IN ~~LOC~~ HOSPITAL LAB): SARS Coronavirus 2: NEGATIVE

## 2020-03-28 LAB — GLUCOSE, CAPILLARY
Glucose-Capillary: 64 mg/dL — ABNORMAL LOW (ref 70–99)
Glucose-Capillary: 131 mg/dL — ABNORMAL HIGH (ref 70–99)

## 2020-03-28 LAB — BASIC METABOLIC PANEL
Anion gap: 11 (ref 5–15)
BUN: 18 mg/dL (ref 8–23)
CO2: 23 mmol/L (ref 22–32)
Calcium: 8.2 mg/dL — ABNORMAL LOW (ref 8.9–10.3)
Chloride: 101 mmol/L (ref 98–111)
Creatinine, Ser: 1.59 mg/dL — ABNORMAL HIGH (ref 0.44–1.00)
GFR calc Af Amer: 35 mL/min — ABNORMAL LOW (ref >60)
GFR calc non Af Amer: 30 mL/min — ABNORMAL LOW (ref >60)
Glucose, Bld: 49 mg/dL — ABNORMAL LOW (ref 70–99)
Potassium: 2.7 mmol/L — CL (ref 3.5–5.1)
Sodium: 135 mmol/L (ref 135–145)

## 2020-03-28 LAB — URINALYSIS, ROUTINE W REFLEX MICROSCOPIC
Bilirubin Urine: NEGATIVE
Glucose, UA: NEGATIVE mg/dL
Ketones, ur: NEGATIVE mg/dL
Nitrite: NEGATIVE
Protein, ur: 30 mg/dL — AB
RBC / HPF: >50 RBC/hpf — ABNORMAL HIGH (ref 0–5)
Specific Gravity, Urine: 1.011 (ref 1.005–1.030)
WBC, UA: >50 WBC/hpf — ABNORMAL HIGH (ref 0–5)
pH: 5 (ref 5.0–8.0)

## 2020-03-28 LAB — COMPREHENSIVE METABOLIC PANEL
ALT: 8 U/L (ref 0–44)
AST: 27 U/L (ref 15–41)
Albumin: 2.9 g/dL — ABNORMAL LOW (ref 3.5–5.0)
Alkaline Phosphatase: 130 U/L — ABNORMAL HIGH (ref 38–126)
Anion gap: 13 (ref 5–15)
BUN: 18 mg/dL (ref 8–23)
CO2: 19 mmol/L — ABNORMAL LOW (ref 22–32)
Calcium: 8.4 mg/dL — ABNORMAL LOW (ref 8.9–10.3)
Chloride: 104 mmol/L (ref 98–111)
Creatinine, Ser: 1.56 mg/dL — ABNORMAL HIGH (ref 0.44–1.00)
GFR calc Af Amer: 36 mL/min — ABNORMAL LOW (ref >60)
GFR calc non Af Amer: 31 mL/min — ABNORMAL LOW (ref >60)
Glucose, Bld: 25 mg/dL — CL (ref 70–99)
Potassium: 2.3 mmol/L — CL (ref 3.5–5.1)
Sodium: 136 mmol/L (ref 135–145)
Total Bilirubin: 2.5 mg/dL — ABNORMAL HIGH (ref 0.3–1.2)
Total Protein: 6.2 g/dL — ABNORMAL LOW (ref 6.5–8.1)

## 2020-03-28 LAB — MAGNESIUM: Magnesium: 1.3 mg/dL — ABNORMAL LOW (ref 1.7–2.4)

## 2020-03-28 LAB — CBG MONITORING, ED
Glucose-Capillary: 20 mg/dL — CL (ref 70–99)
Glucose-Capillary: 142 mg/dL — ABNORMAL HIGH (ref 70–99)
Glucose-Capillary: 53 mg/dL — ABNORMAL LOW (ref 70–99)
Glucose-Capillary: 49 mg/dL — ABNORMAL LOW (ref 70–99)
Glucose-Capillary: 53 mg/dL — ABNORMAL LOW (ref 70–99)
Glucose-Capillary: 77 mg/dL (ref 70–99)

## 2020-03-28 LAB — AMMONIA: Ammonia: 62 umol/L — ABNORMAL HIGH (ref 9–35)

## 2020-03-28 MED ORDER — CITALOPRAM HYDROBROMIDE 20 MG PO TABS
10.0000 mg | ORAL_TABLET | Freq: Every day | ORAL | Status: DC
  Administered 2020-03-28 – 2020-04-02 (×6): 10 mg via ORAL
  Filled 2020-03-28 (×6): qty 1

## 2020-03-28 MED ORDER — DEXTROSE 50 % IV SOLN
INTRAVENOUS | Status: AC
  Administered 2020-03-28: 25 mL via INTRAVENOUS
  Filled 2020-03-28: qty 50

## 2020-03-28 MED ORDER — ONDANSETRON HCL 4 MG PO TABS: 4.0000 mg | ORAL_TABLET | Freq: Four times a day (QID) | ORAL | Status: DC | PRN

## 2020-03-28 MED ORDER — INSULIN ASPART 100 UNIT/ML ~~LOC~~ SOLN
0.0000 [IU] | SUBCUTANEOUS | Status: DC
  Administered 2020-03-29 – 2020-03-31 (×6): 1 [IU] via SUBCUTANEOUS

## 2020-03-28 MED ORDER — ACETAMINOPHEN 325 MG PO TABS: 325.0000 mg | ORAL_TABLET | Freq: Four times a day (QID) | ORAL | Status: DC | PRN

## 2020-03-28 MED ORDER — DEXTROSE 10 % IV SOLN: INTRAVENOUS | Status: DC

## 2020-03-28 MED ORDER — RIFAXIMIN 550 MG PO TABS
550.0000 mg | ORAL_TABLET | Freq: Two times a day (BID) | ORAL | Status: DC
  Administered 2020-03-31 – 2020-04-02 (×3): 550 mg via ORAL
  Filled 2020-03-28 (×6): qty 1

## 2020-03-28 MED ORDER — METOPROLOL TARTRATE 5 MG/5ML IV SOLN: 5.0000 mg | Freq: Four times a day (QID) | INTRAVENOUS | Status: DC | PRN

## 2020-03-28 MED ORDER — TRIAMCINOLONE ACETONIDE 0.1 % EX CREA
1.0000 "application " | TOPICAL_CREAM | Freq: Two times a day (BID) | CUTANEOUS | Status: DC | PRN
  Filled 2020-03-28: qty 15

## 2020-03-28 MED ORDER — LATANOPROST 0.005 % OP SOLN
1.0000 [drp] | Freq: Every day | OPHTHALMIC | Status: DC
  Administered 2020-03-29 – 2020-04-01 (×4): 1 [drp] via OPHTHALMIC
  Filled 2020-03-28: qty 2.5

## 2020-03-28 MED ORDER — SPIRONOLACTONE 12.5 MG HALF TABLET
12.5000 mg | ORAL_TABLET | Freq: Every day | ORAL | Status: DC
  Administered 2020-03-28 – 2020-03-31 (×4): 12.5 mg via ORAL
  Filled 2020-03-28 (×4): qty 1

## 2020-03-28 MED ORDER — CLOPIDOGREL BISULFATE 75 MG PO TABS
75.0000 mg | ORAL_TABLET | Freq: Every day | ORAL | Status: DC
  Administered 2020-03-28 – 2020-04-02 (×6): 75 mg via ORAL
  Filled 2020-03-28 (×7): qty 1

## 2020-03-28 MED ORDER — PANTOPRAZOLE SODIUM 40 MG PO TBEC
40.0000 mg | DELAYED_RELEASE_TABLET | Freq: Two times a day (BID) | ORAL | Status: DC
  Administered 2020-03-29 – 2020-04-02 (×9): 40 mg via ORAL
  Filled 2020-03-28 (×9): qty 1

## 2020-03-28 MED ORDER — DEXTROSE 50 % IV SOLN
INTRAVENOUS | Status: AC
  Filled 2020-03-28: qty 50

## 2020-03-28 MED ORDER — SODIUM CHLORIDE 0.9% FLUSH
3.0000 mL | Freq: Two times a day (BID) | INTRAVENOUS | Status: DC
  Administered 2020-03-28 – 2020-04-01 (×2): 3 mL via INTRAVENOUS

## 2020-03-28 MED ORDER — ACETAMINOPHEN 650 MG RE SUPP: 325.0000 mg | Freq: Four times a day (QID) | RECTAL | Status: DC | PRN

## 2020-03-28 MED ORDER — ONDANSETRON HCL 4 MG/2ML IJ SOLN: 4.0000 mg | Freq: Four times a day (QID) | INTRAMUSCULAR | Status: DC | PRN

## 2020-03-28 MED ORDER — EZETIMIBE 10 MG PO TABS
10.0000 mg | ORAL_TABLET | Freq: Every day | ORAL | Status: DC
  Administered 2020-03-28 – 2020-04-02 (×6): 10 mg via ORAL
  Filled 2020-03-28 (×6): qty 1

## 2020-03-28 MED ORDER — POTASSIUM CHLORIDE 2 MEQ/ML IV SOLN
INTRAVENOUS | Status: DC
  Filled 2020-03-28 (×4): qty 1000

## 2020-03-28 MED ORDER — DEXTROSE 50 % IV SOLN
25.0000 mL | Freq: Once | INTRAVENOUS | Status: AC
  Administered 2020-03-28: 25 mL via INTRAVENOUS

## 2020-03-28 MED ORDER — MIRTAZAPINE 15 MG PO TABS
15.0000 mg | ORAL_TABLET | Freq: Every day | ORAL | Status: DC
  Administered 2020-03-28 – 2020-04-01 (×5): 15 mg via ORAL
  Filled 2020-03-28 (×5): qty 1

## 2020-03-28 MED ORDER — FUROSEMIDE 40 MG PO TABS
40.0000 mg | ORAL_TABLET | Freq: Every day | ORAL | Status: DC
  Administered 2020-03-28 – 2020-03-31 (×4): 40 mg via ORAL
  Filled 2020-03-28 (×4): qty 1

## 2020-03-28 MED ORDER — AMLODIPINE BESYLATE 5 MG PO TABS
5.0000 mg | ORAL_TABLET | Freq: Every day | ORAL | Status: DC
  Administered 2020-03-28 – 2020-03-31 (×4): 5 mg via ORAL
  Filled 2020-03-28 (×4): qty 1

## 2020-03-28 MED ORDER — RUXOLITINIB PHOSPHATE 25 MG PO TABS
25.0000 mg | ORAL_TABLET | Freq: Two times a day (BID) | ORAL | Status: DC
  Administered 2020-03-29 – 2020-04-01 (×6): 25 mg via ORAL
  Filled 2020-03-28 (×9): qty 1

## 2020-03-28 MED ORDER — POTASSIUM CHLORIDE CRYS ER 20 MEQ PO TBCR
40.0000 meq | EXTENDED_RELEASE_TABLET | Freq: Once | ORAL | Status: DC
  Filled 2020-03-28: qty 2

## 2020-03-28 MED ORDER — POTASSIUM CHLORIDE 10 MEQ/100ML IV SOLN
10.0000 meq | INTRAVENOUS | Status: AC
  Administered 2020-03-28 (×4): 10 meq via INTRAVENOUS
  Filled 2020-03-28 (×4): qty 100

## 2020-03-28 MED ORDER — TIMOLOL MALEATE 0.5 % OP SOLN
1.0000 [drp] | Freq: Every day | OPHTHALMIC | Status: DC
  Administered 2020-03-28 – 2020-04-02 (×6): 1 [drp] via OPHTHALMIC
  Filled 2020-03-28: qty 5

## 2020-03-28 MED ORDER — POTASSIUM CHLORIDE 20 MEQ PO PACK
40.0000 meq | PACK | Freq: Once | ORAL | Status: DC
  Filled 2020-03-28 (×2): qty 2

## 2020-03-28 NOTE — ED Notes (Signed)
CBG obtained: 36m/dL. D50 amp and orange juice provided

## 2020-03-28 NOTE — Progress Notes (Signed)
Pt. Refused cpap. 

## 2020-03-28 NOTE — ED Triage Notes (Signed)
Pt presents from endocrinologist for new onset afib, was too dizzy to get up at appt, EMS called.   At scene, EMS notes new onset afib with frequent PVCs.  H/o recently elevated ammonia  150/90, rate 100-140bpm, 100%RA, no pain

## 2020-03-28 NOTE — H&P (Signed)
History and Physical    Amanda [name]:[id number] DOB: Sep 23, 1941 DOA: 03/28/2020  PCP: [name], NP  Patient coming from: Home  I have personally briefly reviewed patient's old medical records in [place]  Chief Complaint: Significant weakness, severe hypoglycemia  HPI: Amanda [name] is a 79 y.o. female with medical history significant of type 2 diabetes mellitus diagnosed in 2008 insulin-dependent since diagnosis which is poorly controlled with complications of diabetic retinopathy, chronic kidney disease, peripheral neuropathy, history of coronary artery disease status post stents, history of CVA(03/2018), prior history of C. difficile colitis, depression, cirrhosis, hyperlipidemia, polycythemia, myeloproliferative disorder who presents to the ED from endocrinologist office with weakness.  Patient noted to have told the ED physician she had not eaten anything all day long because she had been at a doctor's appointment and plan was to get something to eat after her appointments.  Patient noted to have taken her insulin and her medications today.  It was noted that when patient was leaving her endocrinologist office she was significantly weak and unable to walk.  It was noted that patient was staring off into space and seemed not to be comprehending what was going on.  Patient does report some dizziness/lightheadedness.  Denied any spinning sensation.  Patient denies any fevers, no chills, no nausea, no vomiting, no abdominal pain, no chest pain, no shortness of breath, no syncope, no asymmetric weakness or numbness, no melena, no hematemesis, hematochezia.  Patient noted to have had 4 bowel movements in the ED per patient.  No other associated symptoms.  ED Course: Comprehensive metabolic profile obtained with a sodium of 136, potassium of 2.3, bicarb of 19, glucose of 25, creatinine of 1.56, alk phosphatase 130, protein of 6.2 otherwise was within normal limits.  Ammonia level of 62  CBC with a white count of 39.8, hemoglobin of 13.4, platelet count of 487 otherwise was within normal limits.  CBG in the ED noted at 25.  SARS coronavirus PCR negative.  Urinalysis pending.  Chest x-ray negative for any acute infiltrate.  Per ED physician patient was given orange juice, crackers and D50 when CBG noted to be at 20.  Repeat blood glucose level was 140 with some clinical improvement.  Patient also noted to be tachycardic with heart rate in the low 100s with frequent PVCs, PACs which per ED physician with improvement with improved blood glucose level.  Repeat blood glucose levels further obtained showed a blood glucose of 52 and patient subsequently started on a D10 drip at 125 cc/h.  Per ED physician husband stated that patient has been home from rehab for approximately 3 weeks and lactulose was subsequently discontinued due to severe diarrhea.  Patient had not been on any lactulose for the past 3 weeks.  Patient also noted to have a poor appetite and not sure why patient would not eat.  Hospitalist were called to admit the patient for further evaluation and management.  Review of Systems: As per HPI otherwise all other systems reviewed and are negative.  Past Medical History:  Diagnosis Date  . Allergic rhinitis 01/23/2016  . C. difficile colitis   . CAD (coronary artery disease)    a. s/p STEMI in 01/2015 with 95% LCx stenosis and distal 80% LCx stenosis (DESx2 placed)  . Cancer ([place])    SKIN  . Candidiasis of skin 09/30/2014  . Cirrhosis ([place])   . Depression   . Diabetes mellitus 2008  . Gout   . Herpes simplex   .  Hyperglycemia 05/31/2013  . Hyperlipidemia   . Hypertension   . Myocardial infarction ([place])   . 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 Obesity   . Polycythemia    Dr. Elease Hashimoto- [facility]  . Psoriasis     Past Surgical History:  Procedure Laterality Date  . ANKLE FRACTURE SURGERY    . BIOPSY  05/26/2018   Procedure: BIOPSY;  Surgeon: [name],  MD;  Location: [facility] ENDOSCOPY;  Service: Endoscopy;;  . BIOPSY  02/19/2020   Procedure: BIOPSY;  Surgeon: Thornton Park, MD;  Location: [place];  Service: Gastroenterology;;  . BREAST SURGERY Left    milk duct  . CATARACT EXTRACTION, BILATERAL    . COLONOSCOPY    . ENTEROSCOPY N/A 02/19/2020   Procedure: ENTEROSCOPY;  Surgeon: Thornton Park, MD;  Location: [place];  Service: Gastroenterology;  Laterality: N/A;  . ESOPHAGOGASTRODUODENOSCOPY N/A 05/26/2018   Procedure: ESOPHAGOGASTRODUODENOSCOPY (EGD);  Surgeon: [name], MD;  Location: [facility] ENDOSCOPY;  Service: Endoscopy;  Laterality: N/A;  . ESOPHAGOGASTRODUODENOSCOPY (EGD) WITH PROPOFOL N/A 01/21/2020   Procedure: ESOPHAGOGASTRODUODENOSCOPY (EGD) WITH PROPOFOL;  Surgeon: Yetta Flock, MD;  Location: [place];  Service: Gastroenterology;  Laterality: N/A;  . HEMOSTASIS CLIP PLACEMENT  01/21/2020   Procedure: HEMOSTASIS CLIP PLACEMENT;  Surgeon: Yetta Flock, MD;  Location: [facility] ENDOSCOPY;  Service: Gastroenterology;;  . HOT HEMOSTASIS N/A 01/21/2020   Procedure: HOT HEMOSTASIS (ARGON PLASMA COAGULATION/BICAP);  Surgeon: Yetta Flock, MD;  Location: [facility] ENDOSCOPY;  Service: Gastroenterology;  Laterality: N/A;  . HOT HEMOSTASIS N/A 02/19/2020   Procedure: HOT HEMOSTASIS (ARGON PLASMA COAGULATION/BICAP);  Surgeon: Thornton Park, MD;  Location: [place];  Service: Gastroenterology;  Laterality: N/A;  . IR IMAGING GUIDED PORT INSERTION  08/30/2019  . IR PARACENTESIS  05/25/2018  . LEFT HEART CATHETERIZATION WITH CORONARY ANGIOGRAM N/A 01/27/2015   Procedure: LEFT HEART CATHETERIZATION WITH CORONARY ANGIOGRAM;  Surgeon: [name], MD;  Location: [facility] CATH LAB;  Service: Cardiovascular;  Laterality: N/A;  . TONSILLECTOMY    . TOTAL ABDOMINAL HYSTERECTOMY W/ BILATERAL SALPINGOOPHORECTOMY     for heavy periods with appendectomy    Social History  reports that she quit smoking about 33 years ago. Her smoking  use included cigarettes. She quit after 48.00 years of use. She has never used smokeless tobacco. She reports that she does not drink alcohol and does not use drugs.  Allergies  Allergen Reactions  . Doxycycline Hives, Swelling and Rash  . Penicillins Swelling and Other (See Comments)    Face swells Has patient had a PCN reaction causing immediate rash, facial/tongue/throat swelling, SOB or lightheadedness with hypotension: Yes Has patient had a PCN reaction causing severe rash involving mucus membranes or skin necrosis: No Has patient had a PCN reaction that required hospitalization: No Has patient had a PCN reaction occurring within the last 10 years: No If all of the above answers are "NO", then may proceed with Cephalosporin use.  Marland Kitchen Lisinopril Cough  . Tape Hives  . Latex Hives, Itching and Rash    Family History  Problem Relation Age of Onset  . Diabetes Mother   . Heart disease Mother        CAD  . Hyperlipidemia Mother   . Hypertension Mother   . Kidney disease Mother   . Kidney disease Father   . Breast cancer Maternal Aunt   . Heart disease Maternal Grandmother   . Heart disease Other        maternal aunts  and uncles  . Colon cancer Neg Hx   . Esophageal cancer Neg Hx    Per patient mother deceased age 34 from coronary artery disease.  Father deceased age 51 from complications of kidney.  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  Prior to Admission medications   Medication Sig Start Date End Date Taking? Authorizing Provider  amLODipine (NORVASC) 5 MG tablet Take 5 mg by mouth daily.   Yes [provider]  citalopram (CELEXA) 10 MG tablet Take 1 tablet (10 mg total) by mouth daily. 03/18/20  Yes Nafziger, Tommi Rumps, NP  clopidogrel (PLAVIX) 75 MG tablet Take 75 mg by mouth daily.  02/29/20  Yes [provider]  Dulaglutide (TRULICITY) 1.5 OA/4.1YS SOPN Inject 1.5 mg into the skin every Friday.    Yes [provider]  ezetimibe (ZETIA) 10 MG tablet Take 1 tablet (10 mg total) by mouth daily. 02/13/20  Yes [name], MD  furosemide (LASIX) 40 MG tablet TAKE 1 TABLET(40 MG) BY MOUTH DAILY Patient taking differently: Take 40 mg by mouth daily.  10/16/19  Yes [name], MD  insulin regular human CONCENTRATED (HUMULIN R U-500 KWIKPEN) 500 UNIT/ML kwikpen Inject 90 Units into the skin daily before breakfast.    Yes [provider]  latanoprost (XALATAN) 0.005 % ophthalmic solution Place 1 drop into both eyes at bedtime.  03/23/19  Yes [provider]  lidocaine-prilocaine (EMLA) cream Apply 1 application topically as needed. Please apply EMLA over the Port-A-Cath site 1 hour prior to coming to our office. Patient taking differently: Apply 1 application topically daily as needed (for port).  08/17/19  Yes Volanda Napoleon, MD  loperamide (IMODIUM A-D) 2 MG tablet Take 2 mg by mouth 4 (four) times daily as needed for diarrhea or loose stools.   Yes [provider]  mirtazapine (REMERON) 15 MG tablet Take 1 tablet (15 mg total) by mouth at bedtime. 08/17/19  Yes Nafziger, Tommi Rumps, NP  pantoprazole (PROTONIX) 40 MG tablet Take 1 tablet (40 mg total) by mouth 2 (two) times daily before a meal. 03/18/20 06/16/20 Yes Nafziger, Tommi Rumps, NP  ruxolitinib phosphate (JAKAFI) 25 MG tablet Take 25 mg by mouth 2 (two) times daily.   Yes [provider]  spironolactone (ALDACTONE) 25 MG tablet Take 0.5 tablets (12.5 mg total) by mouth daily. 01/25/20  Yes Lavina Hamman, MD  timolol (TIMOPTIC) 0.5 % ophthalmic solution Place 1 drop into both eyes daily.  10/07/19  Yes [provider]  triamcinolone cream (KENALOG) 0.1 % Apply 1 application topically 2 (two) times daily. Patient taking differently: Apply 1 application topically 2 (two) times daily as needed (for rask skin).  12/11/19  Yes Nafziger, Tommi Rumps, NP  XIFAXAN 550 MG TABS tablet Take 1 tablet (550 mg total) by mouth 2 (two) times  daily. 03/24/20  Yes Noralyn Pick, NP  Blood Glucose Monitoring Suppl (ACCU-CHEK GUIDE) w/Device KIT 1 kit by Does not apply route as directed. 01/25/18   [name], MD  glucose blood (ACCU-CHEK GUIDE) test strip Use to check blood sugars 3 times daily 01/25/18   [name], MD  insulin aspart (NOVOLOG) 100 UNIT/ML injection Inject 0-15 Units into the skin 3 (three) times daily with meals. CBG 70 - 120: 0 units CBG 121 - 150: 2 units CBG 151 - 200: 3 units CBG 201 - 250: 5 units CBG 251 - 300: 8 units CBG 301 - 350: 11 units  CBG 351 - 400: 15 units Patient not taking: Reported on 03/28/2020 01/24/20   Lavina Hamman, MD  [facility] VERIO test strip USE TO TEST BLOOD SUGAR THREE TIMES DAILY 05/01/18   [name], MD    Physical Exam: Vitals:   03/28/20 1527 03/28/20 1630 03/28/20 1645 03/28/20 1715  BP:  (!) 137/93 131/66 (!) 128/94  Pulse:  (!) 43 87 87  Resp:  (!) 23 (!) 26 15  Temp:      TempSrc:      SpO2:  99% 98% 96%  Weight: 87.1 kg     Height: 5' 4"  (1.626 m)       Constitutional: NAD, calm, comfortable Vitals:   03/28/20 1527 03/28/20 1630 03/28/20 1645 03/28/20 1715  BP:  (!) 137/93 131/66 (!) 128/94  Pulse:  (!) 43 87 87  Resp:  (!) 23 (!) 26 15  Temp:      TempSrc:      SpO2:  99% 98% 96%  Weight: 87.1 kg     Height: 5' 4"  (1.626 m)      Eyes: PERRL, lids and conjunctivae normal ENMT: Mucous membranes are dry. Posterior pharynx clear of any exudate or lesions.Normal dentition.  Neck: normal, supple, no masses, no thyromegaly Respiratory: clear to auscultation bilaterally, no wheezing, no crackles. Normal respiratory effort. No accessory muscle use.  Cardiovascular: Regular rate and rhythm, no murmurs / rubs / gallops.  Trace bilateral lower extremity edema. 2+ pedal pulses. No carotid bruits.  Abdomen: no tenderness, no masses palpated.  Soft, no hepatosplenomegaly. Bowel sounds positive.  Musculoskeletal: no clubbing / cyanosis. No  joint deformity upper and lower extremities. Good ROM, no contractures. Normal muscle tone.  Skin: no rashes, lesions, ulcers. No induration Neurologic: CN 2-12 grossly intact. Sensation intact, DTR normal. Strength 5/5 in all 4.  Psychiatric: Normal judgment and insight. Alert and oriented x 3. Normal mood.    Labs on Admission: I have personally reviewed following labs and imaging studies  CBC: Recent Labs  Lab 03/28/20 1527  WBC 39.8*  NEUTROABS 33.5*  HGB 13.4  HCT 42.2  MCV 87.7  PLT 487*    Basic Metabolic Panel: Recent Labs  Lab 03/28/20 1527  NA 136  K 2.3*  CL 104  CO2 19*  GLUCOSE 25*  BUN 18  CREATININE 1.56*  CALCIUM 8.4*    GFR: Estimated Creatinine Clearance: 30.7 mL/min (A) (by C-G formula based on SCr of 1.56 mg/dL (H)).  Liver Function Tests: Recent Labs  Lab 03/28/20 1527  AST 27  ALT 8  ALKPHOS 130*  BILITOT 2.5*  PROT 6.2*  ALBUMIN 2.9*    Urine analysis:    Component Value Date/Time   COLORURINE YELLOW 02/18/2020 0316   APPEARANCEUR HAZY (A) 02/18/2020 0316   LABSPEC 1.009 02/18/2020 0316   PHURINE 6.0 02/18/2020 0316   GLUCOSEU NEGATIVE 02/18/2020 0316   GLUCOSEU NEGATIVE 01/10/2018 1217   HGBUR NEGATIVE 02/18/2020 0316   HGBUR large 05/07/2010 0845   BILIRUBINUR NEGATIVE 02/18/2020 0316   BILIRUBINUR neg 01/26/2016 1003   KETONESUR NEGATIVE 02/18/2020 0316   PROTEINUR NEGATIVE 02/18/2020 0316   UROBILINOGEN 0.2 01/10/2018 1217   NITRITE POSITIVE (A) 02/18/2020 0316   LEUKOCYTESUR MODERATE (A) 02/18/2020 0316    Radiological Exams on Admission: DG Chest Port 1 View  Result Date: 03/28/2020 CLINICAL DATA:  Atrial fibrillation.  Dizziness. EXAM: PORTABLE CHEST 1 VIEW COMPARISON:  Feb 17, 2020 FINDINGS: Port-A-Cath tip is in the superior vena cava. No pneumothorax. There  is no edema or airspace opacity. Heart is upper normal in size with pulmonary vascularity within normal limits. No adenopathy. No bone lesions. IMPRESSION:  Port-A-Cath tip in superior vena cava. No pneumothorax. No edema or airspace opacity. Stable cardiac silhouette. Electronically Signed   By: Lowella Grip III M.D.   On: 03/28/2020 16:17    EKG: Independently reviewed.  Normal sinus rhythm with PVCs.  Assessment/Plan Principal Problem:   Severe diabetic hypoglycemia (HCC) Active Problems:   Acute hypokalemia   Hyperlipidemia   Depression   Essential hypertension, benign   DIASTOLIC DYSFUNCTION   Leukocytosis   Diabetic peripheral neuropathy associated with type 2 diabetes mellitus (HCC)   Chronic diastolic CHF (congestive heart failure) (HCC)   Coronary artery disease involving native coronary artery   Obesity (BMI 30-39.9)   Hepatic cirrhosis (HCC)   OSA (obstructive sleep apnea)   Diabetic retinopathy of both eyes (HCC)   GAD (generalized anxiety disorder)   Type 2 diabetes mellitus with diabetic polyneuropathy (HCC)   Dehydration   AKI (acute kidney injury) ([place])   History of CVA (cerebrovascular accident)   CKD (chronic kidney disease), stage III   Weakness generalized   1 severe hypoglycemia/poorly controlled diabetes mellitus type 2(hemoglobin A1c 5.7 12/29/2019) Patient presented with generalized weakness noted to have a CBG of 20 on presentation to the ED.  Likely etiology secondary to no oral intake in the setting of insulin treatment.  Patient stated had not eating on the day of admission however was compliant with her insulin.  Per endocrinologist office patient is noted to be on U500, 90 units in the morning.  Patient also noted to be on Trulicity 1.5 mg weekly.  Patient's nighttime dose of U500 recently discontinued per patient as patient noted to have low blood sugars later on in the day.  Patient noted to have a hemoglobin A1c of 5.7 on 12/29/2019.  Patient with a significant leukocytosis.  Panculture to rule out infectious etiology.  Check a C. difficile PCR.  Continue D10.  Check CBGs every 2 hours.  Hold patient's  long-acting insulin.  Place on sensitive sliding scale insulin.  Consult with diabetic coordinator.  Follow.  2.  Severe hypokalemia Patient noted to have a potassium of 2.3 on presentation.  Patient on diuretics for cirrhosis.  Patient also noted to be unable to tolerate daily oral potassium supplementation which has been ordered in the past due to difficulty swallowing the large potassium tablet.  Potassium packet given in the ED however due to taste patient spat it out per RN.  Check a magnesium level.  Patient refusing oral potassium supplementation due to size of the pill and taste of the packet.  KCl 10 mEq IV every 1 hour x45 g have been ordered per ED physician which we will continue.  We will add potassium to IV fluids.  Repeat BMET every 4 hours.  Follow.  3.  Leukocytosis Patient with a significant leukocytosis.  Patient with a WBC of 39.8.  Patient hospitalized recently in March 2021 and diagnosed with a E. coli/actinobacter UTI which was treated with Merrem.  Patient noted to recently have discontinued lactulose due to worsening diarrhea and has been off of lactulose for almost 3 weeks per ED physician note.  Patient states had 4 loose stools today since presentation in the ED.  Patient with no abdominal pain.  Afebrile.  Check a C. difficile PCR.  UA with cultures and sensitivities pending.  Blood cultures pending.  Chest x-ray negative for  any acute infiltrate.  Hold off on any antibiotics for now.  Follow.  4.  Generalized weakness Likely secondary to problem #1.  PT/OT.  5.  Hepatic cirrhosis Patient noted to have been off her lactulose for about 3 weeks now due to ongoing increased diarrhea.  Ammonia level noted to be 62.  Patient however does not seem significantly confused.  Continue home regimen Xifaxan, Lasix, spironolactone.  Follow.  6.  Coronary artery disease Stable.  Resume home regimen Norvasc, aspirin, Zetia, Lasix, spironolactone.  Plavix noted to have been discontinued  in March 2021.  Outpatient follow-up with cardiology.  7.  Hypertension Resume home regimen Norvasc, Lasix, spironolactone.  Follow.  8.  Gastroesophageal reflux disease PPI.  9.  Chronic kidney disease stage III Monitor closely on diuretics.  Follow.  10.  History of portal vein thrombosis Was on anticoagulation of Eliquis which was recently discontinued secondary to GI bleed.  Outpatient follow-up with Dr. [name], oncology.  11.  Hepatic encephalopathy Is noted per GIs note that patient unable to tolerate lactulose which was subsequently discontinued.  Patient with no signs of acute encephalopathy at this time.  Continue home regimen Xifaxan.  12.  OSA CPAP nightly.  DVT prophylaxis: SCDs Code Status:  DNR Family Communication:  Updated patient.  No family at bedside. Disposition Plan:   Patient is from:  Home  Anticipated DC to:  Home with home health versus SNF  Anticipated DC date:  To be determined  Anticipated DC barriers: Resolution of hypoglycemia and hypokalemia and clinical improvement.  Consults called:  None Admission status:  Admit to inpatient.  Patient will require more than a 2 midnight stay due to severity of illness with severe hypoglycemia noted to have a CBG of 20 on presentation, patient noted with severe hypokalemia with PVCs noted on monitor with potassium of 2.3.  Patient with increased risk of decompensation, patient will be placed on IV fluids, patient also been pancultured due to significant leukocytosis.  Patient will require at least a 2 midnight stay.      Irine Seal MD [facility]  How to contact the [facility] Attending or Consulting provider [place] or covering provider during after hours [place], for this patient?   1. Check the care team in [facility] and look for a) attending/consulting TR[facility] provider listed and b) the [facility] team listed 2. Log into [email or web address] and use [place]'s universal password to access. If you do not have the password,  please contact the hospital operator. 3. Locate the [facility] provider you are looking for under [facility] and page to a number that you can be directly reached. 4. If you still have difficulty reaching the provider, please page the [facility] (Director on Call) for the Hospitalists listed on amion for assistance.  03/28/2020, 6:38 PM

## 2020-03-28 NOTE — ED Provider Notes (Signed)
[facility] EMERGENCY DEPARTMENT Provider Note   CSN: [id number] Arrival date & time: 03/28/20  1452     History No chief complaint on file.   Amanda [name] is a 79 y.o. female.  Patient is an 79 year old female with a history of CAD, diabetes, hypertension, hyperlipidemia, polycythemia, recent acute kidney injury on top of chronic kidney disease, CVA, cirrhosis, CHF and prior GI bleeding who is presenting today with weakness.  Patient reports that she has not had anything to eat all day long because she has been at her doctor's appointments.  EMS reported she was able to get out of the car and walk into her endocrinologist appointment but when it was time to go she was unable to stand or walk.  Husband reported that she was staring off into space and seemed not to be comprehending what was going on.  Patient reports that she feels dizzy that she describes as feeling lightheaded.  She denies the room spinning, unilateral weakness or numbness.  She has no visual changes.  She denies any shortness of breath, chest pain, abdominal pain, nausea, vomiting.  It was reported that patient had an elevated ammonia earlier this week but she reports she did have a bowel movement today and has been taking her medications as prescribed.  She is not aware of any recent medication changes.  She denies fever, cough or dysuria.  The history is provided by the patient, the EMS personnel and medical records.       Past Medical History:  Diagnosis Date  . Allergic rhinitis 01/23/2016  . C. difficile colitis   . CAD (coronary artery disease)    a. s/p STEMI in 01/2015 with 95% LCx stenosis and distal 80% LCx stenosis (DESx2 placed)  . Cancer ([place])    SKIN  . Candidiasis of skin 09/30/2014  . Cirrhosis ([place])   . Depression   . Diabetes mellitus 2008  . Gout   . Herpes simplex   . Hyperglycemia 05/31/2013  . Hyperlipidemia   . Hypertension   . Myocardial infarction ([place])   .  Neuromuscular disorder ([place])    BELL PALSY  . Obesity   . Polycythemia    Dr. Elease Hashimoto- [facility]  . Psoriasis     Patient Active Problem List   Diagnosis Date Noted  . Gastritis and gastroduodenitis   . Acute GI bleeding 02/17/2020  . Weakness generalized   . AVM (arteriovenous malformation) of small bowel, acquired with hemorrhage   . Malnutrition of moderate degree 01/16/2020  . Goals of care, counseling/discussion   . Palliative care by specialist   . Nausea & vomiting 01/13/2020  . Failure to thrive in adult 01/13/2020  . 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 Portal vein thrombosis   . SIRS (systemic inflammatory response syndrome) ([place]) 12/28/2019  . Worsening lower extremity weakness 12/14/2019  . Leg swelling 08/21/2019  . Hypotension due to hypovolemia 08/10/2019  . CKD (chronic kidney disease), stage III 08/10/2019  . Acute blood loss anemia 05/23/2018  . Dehydration 03/22/2018  . AKI (acute kidney injury) ([place]) 03/22/2018  . History of CVA (cerebrovascular accident) 03/22/2018  . Hip injury, left, subsequent encounter 11/08/2017  . Iron deficiency anemia 12/22/2016  . OSA (obstructive sleep apnea) 04/21/2016  . Osteopenia 04/07/2016  . Lung nodule, solitary 02/05/2016  . Aortic dilatation ([place]) 02/05/2016  . Dyspnea 01/28/2016  . Cirrhosis ([place]) 01/28/2016  . Allergic rhinitis 01/23/2016  . Hepatic cirrhosis ([place]) 10/28/2015  . Irritable bowel  syndrome 08/12/2015  . Obesity (BMI 30-39.9) 04/30/2015  . Diabetic retinopathy of both eyes ([place]) 04/18/2015  . Former smoker 04/18/2015  . GAD (generalized anxiety disorder) 04/18/2015  . Status post primary angioplasty with coronary stent 04/18/2015  . Coronary artery disease involving native coronary artery 03/01/2015  . Chronic diastolic CHF (congestive heart failure) ([place]) 02/01/2015  . ST elevation myocardial infarction (STEMI) involving left circumflex coronary artery in recovery phase ([place])  01/27/2015  . Idioventricular rhythm ([place])   . Diabetic peripheral neuropathy associated with type 2 diabetes mellitus ([place]) 01/23/2015  . Type 2 diabetes mellitus with diabetic polyneuropathy ([place]) 01/23/2015  . Thrombocytosis ([place]) 07/04/2014  . Cholelithiasis 02/07/2014  . Chronic diarrhea 01/29/2014  . Leukocytosis 06/02/2013  . [name] ([place]) 05/31/2013  . Essential hypertension 05/31/2013  . History of Bell's palsy 05/26/2013  . DERMATITIS, ATOPIC 11/16/2010  . DIZZINESS 11/16/2010  . DIASTOLIC DYSFUNCTION 07/37/1062  . Hyperlipidemia 12/15/2009  . Essential hypertension, benign 11/24/2009  . PSORIASIS 11/24/2009  . Depression 09/13/2009  . 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 ANKLE FRACTURE SURGERY    . BIOPSY  05/26/2018   Procedure: BIOPSY;  Surgeon: [name], MD;  Location: [facility] ENDOSCOPY;  Service: Endoscopy;;  . BIOPSY  02/19/2020   Procedure: BIOPSY;  Surgeon: Thornton Park, MD;  Location: [facility];  Service: Gastroenterology;;  . BREAST SURGERY Left    milk duct  . CATARACT EXTRACTION, BILATERAL    . COLONOSCOPY    . ENTEROSCOPY N/A 02/19/2020   Procedure: ENTEROSCOPY;  Surgeon: Thornton Park, MD;  Location: [facility];  Service: Gastroenterology;  Laterality: N/A;  . ESOPHAGOGASTRODUODENOSCOPY N/A 05/26/2018   Procedure: ESOPHAGOGASTRODUODENOSCOPY (EGD);  Surgeon: [name], MD;  Location: [facility] ENDOSCOPY;  Service: Endoscopy;  Laterality: N/A;  . ESOPHAGOGASTRODUODENOSCOPY (EGD) WITH PROPOFOL N/A 01/21/2020   Procedure: ESOPHAGOGASTRODUODENOSCOPY (EGD) WITH PROPOFOL;  Surgeon: Yetta Flock, MD;  Location: [facility];  Service: Gastroenterology;  Laterality: N/A;  . HEMOSTASIS CLIP PLACEMENT  01/21/2020   Procedure: HEMOSTASIS CLIP PLACEMENT;  Surgeon: Yetta Flock, MD;  Location: [facility] ENDOSCOPY;  Service: Gastroenterology;;  . HOT HEMOSTASIS N/A  01/21/2020   Procedure: HOT HEMOSTASIS (ARGON PLASMA COAGULATION/BICAP);  Surgeon: Yetta Flock, MD;  Location: [facility] ENDOSCOPY;  Service: Gastroenterology;  Laterality: N/A;  . HOT HEMOSTASIS N/A 02/19/2020   Procedure: HOT HEMOSTASIS (ARGON PLASMA COAGULATION/BICAP);  Surgeon: Thornton Park, MD;  Location: [facility];  Service: Gastroenterology;  Laterality: N/A;  . IR IMAGING GUIDED PORT INSERTION  08/30/2019  . IR PARACENTESIS  05/25/2018  . LEFT HEART CATHETERIZATION WITH CORONARY ANGIOGRAM N/A 01/27/2015   Procedure: LEFT HEART CATHETERIZATION WITH CORONARY ANGIOGRAM;  Surgeon: [name], MD;  Location: [facility] CATH LAB;  Service: Cardiovascular;  Laterality: N/A;  . TONSILLECTOMY    . TOTAL ABDOMINAL HYSTERECTOMY W/ BILATERAL SALPINGOOPHORECTOMY     for heavy periods with appendectomy     OB History   No obstetric history on file.     Family History  Problem Relation Age of Onset  . Diabetes Mother   . Heart disease Mother        CAD  . Hyperlipidemia Mother   . Hypertension Mother   . Kidney disease Mother   . Kidney disease Father   . Breast cancer Maternal Aunt   . Heart disease Maternal Grandmother   . Heart disease Other        maternal aunts and uncles  .  Colon cancer Neg Hx   . Esophageal cancer Neg Hx     Social History   Tobacco Use  . Smoking status: Former Smoker    Years: 48.00    Types: Cigarettes    Quit date: 10/18/1986    Years since quitting: 33.4  . Smokeless tobacco: Never Used  Vaping Use  . Vaping Use: Never used  Substance Use Topics  . Alcohol use: No    Alcohol/week: 0.0 standard drinks  . Drug use: No    Home Medications Prior to Admission medications   Medication Sig Start Date End Date Taking? Authorizing Provider  Blood Glucose Monitoring Suppl (ACCU-CHEK GUIDE) w/Device KIT 1 kit by Does not apply route as directed. 01/25/18   [name], MD  citalopram (CELEXA) 10 MG tablet Take 1 tablet (10 mg total) by mouth  daily. Patient not taking: Reported on 03/28/2020 03/18/20   [name], NP  clopidogrel (PLAVIX) 75 MG tablet  02/29/20   [provider]  Dulaglutide (TRULICITY) 1.5 TD/3.2KG SOPN Inject 1.5 mg into the skin once a week. Friday    [provider]  ezetimibe (ZETIA) 10 MG tablet Take 1 tablet (10 mg total) by mouth daily. 02/13/20   [name], MD  furosemide (LASIX) 40 MG tablet TAKE 1 TABLET(40 MG) BY MOUTH DAILY Patient taking differently: Take 40 mg by mouth daily.  10/16/19   [name], MD  glucose blood (ACCU-CHEK GUIDE) test strip Use to check blood sugars 3 times daily 01/25/18   [name], MD  insulin aspart (NOVOLOG) 100 UNIT/ML injection Inject 0-15 Units into the skin 3 (three) times daily with meals. CBG 70 - 120: 0 units CBG 121 - 150: 2 units CBG 151 - 200: 3 units CBG 201 - 250: 5 units CBG 251 - 300: 8 units CBG 301 - 350: 11 units CBG 351 - 400: 15 units Patient taking differently: Inject 90 Units into the skin daily. 90 units once a day in the morning 01/24/20   Lavina Hamman, MD  latanoprost (XALATAN) 0.005 % ophthalmic solution Place 1 drop into both eyes at bedtime.  03/23/19   [provider]  lidocaine-prilocaine (EMLA) cream Apply 1 application topically as needed. Please apply EMLA over the Port-A-Cath site 1 hour prior to coming to our office. Patient taking differently: Apply 1 application topically daily as needed (for port).  08/17/19   Volanda Napoleon, MD  loperamide (IMODIUM) 2 MG capsule Take 2 mg by mouth as needed for diarrhea or loose stools.    [provider]  mirtazapine (REMERON) 15 MG tablet Take 1 tablet (15 mg total) by mouth at bedtime. 08/17/19   Nafziger, Tommi Rumps, NP  ONETOUCH VERIO test strip USE TO TEST BLOOD SUGAR THREE TIMES DAILY 05/01/18   [name], MD  pantoprazole (PROTONIX) 40 MG tablet Take 1 tablet (40 mg total) by mouth 2 (two) times daily before a meal. 03/18/20 06/16/20  Nafziger,  Tommi Rumps, NP  ruxolitinib phosphate (JAKAFI) 25 MG tablet Take 25 mg by mouth 2 (two) times daily.    [provider]  spironolactone (ALDACTONE) 25 MG tablet Take 0.5 tablets (12.5 mg total) by mouth daily. 01/25/20   Lavina Hamman, MD  timolol (TIMOPTIC) 0.5 % ophthalmic solution Place 1 drop into both eyes daily.  10/07/19   [provider]  triamcinolone cream (KENALOG) 0.1 % Apply 1 application topically 2 (two) times daily. Patient taking differently: Apply 1 application topically 2 (two)  times daily as needed (for rask skin).  12/11/19   Nafziger, Tommi Rumps, NP  XIFAXAN 550 MG TABS tablet Take 1 tablet (550 mg total) by mouth 2 (two) times daily. 03/24/20   Noralyn Pick, NP    Allergies    Doxycycline, Penicillins, Lisinopril, Tape, and Latex  Review of Systems   Review of Systems  All other systems reviewed and are negative.   Physical Exam Updated Vital Signs BP (!) 161/105 (BP Location: Right Arm)   Pulse (!) 108   Temp 98.1 F (36.7 C) (Oral)   Resp 18   Ht 5' 4"  (1.626 m)   Wt 87.1 kg   SpO2 98%   BMI 32.96 kg/m   Physical Exam Vitals and nursing note reviewed.  Constitutional:      General: She is not in acute distress.    Appearance: She is well-developed. She is obese.  HENT:     Head: Normocephalic and atraumatic.     Mouth/Throat:     Mouth: Mucous membranes are dry.  Eyes:     General: Scleral icterus present.     Pupils: Pupils are equal, round, and reactive to light.  Cardiovascular:     Rate and Rhythm: Tachycardia present. Rhythm irregular. Frequent extrasystoles are present.    Pulses: Normal pulses.     Heart sounds: Normal heart sounds. No murmur heard.  No friction rub.  Pulmonary:     Effort: Pulmonary effort is normal.     Breath sounds: Normal breath sounds. No wheezing or rales.  Abdominal:     General: Bowel sounds are normal. There is distension.     Palpations: Abdomen is soft. There is fluid wave.     Tenderness:  There is no abdominal tenderness. There is no guarding or rebound.  Musculoskeletal:        General: No tenderness. Normal range of motion.     Right lower leg: Edema present.     Left lower leg: Edema present.     Comments: 1+ pitting edema in ankles bilaterally  Skin:    General: Skin is warm and dry.     Findings: No rash.  Neurological:     Mental Status: She is alert and oriented to person, place, and time.     Cranial Nerves: No cranial nerve deficit.     Comments: 5-5 strength in bilateral upper extremities without pronator drift.  4-5 strength in bilateral lower extremities.  Sensation is intact.  No notable facial droop.  Speech without aphasia.  No asterixis  Psychiatric:        Mood and Affect: Mood normal.        Behavior: Behavior normal.        Thought Content: Thought content normal.     ED Results / Procedures / Treatments   Labs (all labs ordered are listed, but only abnormal results are displayed) Labs Reviewed  CBC WITH DIFFERENTIAL/PLATELET - Abnormal; Notable for the following components:      Result Value   WBC 39.8 (*)    RDW 22.1 (*)    Platelets 487 (*)    Neutro Abs 33.5 (*)    Monocytes Absolute 1.6 (*)    Basophils Absolute 0.2 (*)    Abs Immature Granulocytes 1.33 (*)    All other components within normal limits  COMPREHENSIVE METABOLIC PANEL - Abnormal; Notable for the following components:   Potassium 2.3 (*)    CO2 19 (*)    Glucose, Bld 25 (*)  Creatinine, Ser 1.56 (*)    Calcium 8.4 (*)    Total Protein 6.2 (*)    Albumin 2.9 (*)    Alkaline Phosphatase 130 (*)    Total Bilirubin 2.5 (*)    GFR calc non Af Amer 31 (*)    GFR calc Af Amer 36 (*)    All other components within normal limits  AMMONIA - Abnormal; Notable for the following components:   Ammonia 62 (*)    All other components within normal limits  CBG MONITORING, ED - Abnormal; Notable for the following components:   Glucose-Capillary 20 (*)    All other components  within normal limits  CBG MONITORING, ED - Abnormal; Notable for the following components:   Glucose-Capillary 142 (*)    All other components within normal limits  CBG MONITORING, ED - Abnormal; Notable for the following components:   Glucose-Capillary 53 (*)    All other components within normal limits  SARS CORONAVIRUS 2 BY RT PCR (HOSPITAL ORDER, [facility])  URINALYSIS, ROUTINE W REFLEX MICROSCOPIC    EKG EKG Interpretation  Date/Time:  Friday March 28 2020 15:06:58 EDT Ventricular Rate:  135 PR Interval:    QRS Duration: 97 QT Interval:  295 QTC Calculation: 443 R Axis:   -23 Text Interpretation: Sinus tachycardia new Multiform ventricular premature complexes mew Aberrant conduction of SV complex(es) Borderline left axis deviation Low voltage, extremity and precordial leads Abnormal R-wave progression, early transition Consider anterior infarct Repolarization abnormality, prob rate related Confirmed by Blanchie Dessert ([id number]) on 03/28/2020 3:09:23 PM   Radiology DG Chest Port 1 View  Result Date: 03/28/2020 CLINICAL DATA:  Atrial fibrillation.  Dizziness. EXAM: PORTABLE CHEST 1 VIEW COMPARISON:  Feb 17, 2020 FINDINGS: Port-A-Cath tip is in the superior vena cava. No pneumothorax. There is no edema or airspace opacity. Heart is upper normal in size with pulmonary vascularity within normal limits. No adenopathy. No bone lesions. IMPRESSION: Port-A-Cath tip in superior vena cava. No pneumothorax. No edema or airspace opacity. Stable cardiac silhouette. Electronically Signed   By: Lowella Grip III M.D.   On: 03/28/2020 16:17    Procedures Procedures (including critical care time)  Medications Ordered in ED Medications  dextrose 50 % solution (  Given 03/28/20 1520)    ED Course  I have reviewed the triage vital signs and the nursing notes.  Pertinent labs & imaging results that were available during my care of the patient were reviewed by me  and considered in my medical decision making (see chart for details).    MDM Rules/Calculators/A&P                          Elderly female with multiple medical problems presenting today from her doctor's office due to generalized weakness and ill ability to walk out of the doctor's office.  Patient is awake and alert but reports feeling dizzy and weak.  She also reports she has not eaten all day but has taken her regular medications.  Blood sugar was checked at bedside and glucose was 20.  Patient was given orange juice, cracker and D50.  Repeat blood sugar was 140 and patient is now reporting she feels much better.  Through all this she denied any abdominal pain, chest pain or shortness of breath.  Initially upon arrival patient had tachycardia in the low 100s with frequent PVCs, PACs which are improving with improved blood sugar.  Will check labs  to ensure no evidence of acute kidney injury or signs of infection however suspect this is from not eating but still taking her medications.  Patient is not displaying signs of hepatic encephalopathy at this time.  She does not look significantly fluid overloaded and is in no distress at this time.  4:49 PM On repeat blood sugar patient has dropped to 52 again and was started on a D10 drip at 125.  We will continue to monitor blood sugar every 30 minutes.  Patient has a leukocytosis of 39,000 today which is unclear if this is an acute phase reaction as patient is not febrile or having any infectious symptoms per the patient or her husband.  She has no abdominal pain concerning for SBP and repeat EKG shows sinus rhythm without any evidence of A. fib.  Patient CMP is significant for hypokalemia of 2.3 and mild AKI with creatinine of 1.56 from baseline of 1.13.  Ammonia slightly elevated at 60.  Speaking with husband who is now present he reports she has been home for rehab from approximately 3 weeks and they stopped the lactulose due to severe diarrhea.  She has  not had any in the last 3 weeks.  However he also reports she has had poor appetite since being home and he is not sure why she will not eat.  Patient denies any abdominal pain and states that she is hungry.  Will admit for electrolyte replacement and ensure patient's sugar normalizes.  CXR without acute changes.  CRITICAL CARE Performed by: Hadar Elgersma Total critical care time: 30 minutes Critical care time was exclusive of separately billable procedures and treating other patients. Critical care was necessary to treat or prevent imminent or life-threatening deterioration. Critical care was time spent personally by me on the following activities: development of treatment plan with patient and/or surrogate as well as nursing, discussions with consultants, evaluation of patient's response to treatment, examination of patient, obtaining history from patient or surrogate, ordering and performing treatments and interventions, ordering and review of laboratory studies, ordering and review of radiographic studies, pulse oximetry and re-evaluation of patient's condition.  MDM Number of Diagnoses or Management Options   Amount and/or Complexity of Data Reviewed Clinical lab tests: ordered and reviewed Tests in the radiology section of CPT: ordered and reviewed Tests in the medicine section of CPT: ordered and reviewed Decide to obtain previous medical records or to obtain history from someone other than the patient: yes Obtain history from someone other than the patient: yes Review and summarize past medical records: yes Discuss the patient with other providers: yes Independent visualization of images, tracings, or specimens: yes  Risk of Complications, Morbidity, and/or Mortality Presenting problems: moderate Diagnostic procedures: low Management options: low  Patient Progress Patient progress: improved   Final Clinical Impression(s) / ED Diagnoses Final diagnoses:  Hypokalemia   Hypoglycemia  AKI (acute kidney injury) [facility])    Rx / [place] Orders ED Discharge Orders    None       Blanchie Dessert, MD 03/28/20 1652

## 2020-03-28 NOTE — Patient Instructions (Addendum)
Please continue: - U500 insulin; 90 units before breakfast  - Trulicity 1.5 mg once a week  Please see if you can have labs drawn at Dr. [name]' office.  Please return in 4 months with your sugar log.

## 2020-03-28 NOTE — ED Notes (Signed)
Pt stool sent to lab, rejected due to being well formed.   Peri care performed and then in/out cath with 8Fr. No urine returned at this attempt.   CBG obtained (see prior), 37m/dL, improved to 553mdL with 8oz orange juice. Admitting MD sent secure chat, paged on amion

## 2020-03-28 NOTE — Progress Notes (Signed)
VAST consulted to access port. Called unit and spoke with pt's nurse, [place]; she stated she asked her coworkers if anyone could assist, but no one was checked off. Advised [place] an IVT RN would be there as soon as possible.

## 2020-03-28 NOTE — ED Notes (Signed)
IV team at bedside 

## 2020-03-28 NOTE — Progress Notes (Signed)
Patient ID: Amanda [name], female   DOB: [birth year], 79 y.o.   MRN: [id number]  This visit occurred during the SARS-CoV-2 public health emergency.  Safety protocols were in place, including screening questions prior to the visit, additional usage of staff PPE, and extensive cleaning of exam room while observing appropriate contact time as indicated for disinfecting solutions.   HPI: Amanda [name] is a 79 y.o.-year-old female, presenting for f/u for DM2, dx 2008, insulin-dependent since dx, uncontrolled, with complications (DR, CKD, PN, CAD-s/p AMI 01/2015, s/p 2 stents, cerebrovascular disease-s/p stroke 03/2018). Last visit 1 year and 1 month ago (virtual).  At this visit, the history is mostly offered by husband as she complains of abdominal pain and is quiet today.  From here they are heading to her gastroenterologist office.  Since last visit, she had several admissions in the last year, for ascites, intractable nausea and vomiting, GI bleed.    Reviewed HbA1c levels: Lab Results  Component Value Date   HGBA1C 5.7 (H) 12/29/2019   HGBA1C 6.6 (H) 08/11/2019   HGBA1C 7.9 (H) 03/23/2018  12/2018: HbA1c 5.8% 03/17/2018: HbA1c calculated from fructosamine is the best in a long while, at 6.35%. 08/30/2017: HbA1c calculated from fructosamine: 7.7% 05/06/2017: HbA1c calculated from the fructosamine appears better, at 6.7%. 02/11/2016: HbA1c from  fructosamine is 7.5% 07/01/2015: HbA1c from fructosamine 6.57%. 01/27/2015: HbA1c from fructosamine: 6.57% 05/16/2014: HbA1c from fructosamine: 7.95%. - in 10/16/2012, hemoglobin A1c was 6.9% - in 07/2012, hemoglobin A1c was 9.9% - in 11/16/2010, hemoglobin A1c was 12.8% - in 03/2011, hemoglobin A1c was 6.9% - In 07/10/2010 her hemoglobin A1c was 10.5% C-peptide was 6.3 and 01/2011.  She is currently on: - U500 100 >> 80 units 2x a day >> 90 units in the a.m. . (they stopped the second dose of the day due to low blood sugars later in the day) -  Trulicity 1.5 mg weekly Stopped Invokana 100 mg in the past b/c of negative publicity.  She could not tolerate doses of metformin XR higher than 250 mg twice a day in the past due to diarrhea. She does not want to resume this. Was previously on the Victoza 1.8 mg daily - "made me sick". She was previously on Janumet between 2012-2013.  She checks sugars 3 times a day: - am:  135-158 >> 120-162 >> 102-131, 162, 198 - 2h after b'fast: 238 >> n/c >> 167 >> n/c - before lunch: n/c >> 170-240 >> n/c - 2h after lunch: 379, 406 >> n/c >> 200-228 >> n/c - before dinner: 140-210 >> 86 >> n/c >> 225 >> n/c - bedtime: 200-305 >> n/c >> 143-267 >> n/c Lowest sugar was 23 in the hospital (?!) >> ...>> 67 x1 >> 98; she has hypoglycemia awareness around 100 Highest 452 ... >> 293 (cortisone inj - knee) >> 198  She was seen for nutritional advice and diabetes education, with last visit on 04/2012 - Amanda [name]..  -+ CKD, last BUN/creatinine:  Lab Results  Component Value Date   BUN 13 03/04/2020   CREATININE 1.12 (H) 03/04/2020  On Cozaar.  + HL; latest Lipid panel: Lab Results  Component Value Date   CHOL 167 03/23/2018   HDL 25 (L) 03/23/2018   LDLCALC 93 03/23/2018   LDLDIRECT 103.0 02/14/2017   TRIG 247 (H) 03/23/2018   CHOLHDL 6.7 03/23/2018  On pravastatin. - last eye exam was 09/2019 (reportedly): NPDR (Dr Zigmund Daniel).  She also has glaucoma.  She had cataract surgery -+  Numbness and tingling in feet>> saw PCP, podiatrist, orthopedist >> tried capsaicin cream, compression stockings, currently on Neurontin and compounded neuropathy cream. On ASA 81.  She also has a history of HTN, diastolic dysfunction, polycytemia vera, gout, psoriasis, depression, history of Bell's palsy, obesity, vitamin D deficiency.  On 01/27/2015 she had an AMI >> had 2 stents placed then. She was admitted to the hospital on 03/22/2018 for stroke - per review of her MRI report: Punctate small vessel type acute  ischemic nonhemorrhagic infarct involving the posterior right centrum semi ovale.  60  ROS: Constitutional: no weight gain/+ weight loss, no fatigue, no subjective hyperthermia, no subjective hypothermia Eyes: no blurry vision, no xerophthalmia ENT: no sore throat, no nodules palpated in neck, no dysphagia, no odynophagia, no hoarseness Cardiovascular: no CP/+ SOB/no palpitations/no leg swelling Respiratory: no cough/+ SOB/no wheezing Gastrointestinal: no N/no V/no D/no C/no acid reflux, + AP Musculoskeletal: no muscle aches/no joint aches Skin: no rashes, no hair loss Neurological: no tremors/+ numbness/+ tingling/no dizziness  I reviewed pt's medications, allergies, PMH, social hx, family hx, and changes were documented in the history of present illness. Otherwise, unchanged from my initial visit note.  Past Medical History:  Diagnosis Date  . Allergic rhinitis 01/23/2016  . C. difficile colitis   . CAD (coronary artery disease)    a. s/p STEMI in 01/2015 with 95% LCx stenosis and distal 80% LCx stenosis (DESx2 placed)  . Cancer ([place])    SKIN  . Candidiasis of skin 09/30/2014  . Cirrhosis ([place])   . Depression   . Diabetes mellitus 2008  . Gout   . Herpes simplex   . Hyperglycemia 05/31/2013  . Hyperlipidemia   . Hypertension   . Myocardial infarction ([place])   . Neuromuscular disorder ([place])    BELL PALSY  . Obesity   . Polycythemia    Dr. Elease Hashimoto- [facility]  . Psoriasis    Past Surgical History:  Procedure Laterality Date  . ANKLE FRACTURE SURGERY    . BIOPSY  05/26/2018   Procedure: BIOPSY;  Surgeon: [name], MD;  Location: [facility] ENDOSCOPY;  Service: Endoscopy;;  . BIOPSY  02/19/2020   Procedure: BIOPSY;  Surgeon: Thornton Park, MD;  Location: [place];  Service: Gastroenterology;;  . BREAST SURGERY Left    milk duct  . CATARACT EXTRACTION, BILATERAL    . COLONOSCOPY    . ENTEROSCOPY N/A 02/19/2020   Procedure: ENTEROSCOPY;  Surgeon: Thornton Park, MD;   Location: [place];  Service: Gastroenterology;  Laterality: N/A;  . ESOPHAGOGASTRODUODENOSCOPY N/A 05/26/2018   Procedure: ESOPHAGOGASTRODUODENOSCOPY (EGD);  Surgeon: [name], MD;  Location: [facility] ENDOSCOPY;  Service: Endoscopy;  Laterality: N/A;  . ESOPHAGOGASTRODUODENOSCOPY (EGD) WITH PROPOFOL N/A 01/21/2020   Procedure: ESOPHAGOGASTRODUODENOSCOPY (EGD) WITH PROPOFOL;  Surgeon: Yetta Flock, MD;  Location: [place];  Service: Gastroenterology;  Laterality: N/A;  . HEMOSTASIS CLIP PLACEMENT  01/21/2020   Procedure: HEMOSTASIS CLIP PLACEMENT;  Surgeon: Yetta Flock, MD;  Location: [place] ENDOSCOPY;  Service: Gastroenterology;;  . HOT HEMOSTASIS N/A 01/21/2020   Procedure: HOT HEMOSTASIS (ARGON PLASMA COAGULATION/BICAP);  Surgeon: Yetta Flock, MD;  Location: [facility] ENDOSCOPY;  Service: Gastroenterology;  Laterality: N/A;  . HOT HEMOSTASIS N/A 02/19/2020   Procedure: HOT HEMOSTASIS (ARGON PLASMA COAGULATION/BICAP);  Surgeon: Thornton Park, MD;  Location: [place];  Service: Gastroenterology;  Laterality: N/A;  . IR IMAGING GUIDED PORT INSERTION  08/30/2019  . IR PARACENTESIS  05/25/2018  . LEFT HEART CATHETERIZATION WITH CORONARY ANGIOGRAM N/A 01/27/2015  Procedure: LEFT HEART CATHETERIZATION WITH CORONARY ANGIOGRAM;  Surgeon: [name], MD;  Location: [facility] C[facility] LAB;  Service: Cardiovascular;  Laterality: N/A;  . TONSILLECTOMY    . TOTAL ABDOMINAL HYSTERECTOMY W/ BILATERAL SALPINGOOPHORECTOMY     for heavy periods with appendectomy   Social History   Socioeconomic History  . Marital status: Married    Spouse name: Amanda [name]  . Number of children: 2  . Years of education: 2 yr colle  . Highest education level: Not on file  Occupational History  . Occupation: housewife  Tobacco Use  . Smoking status: Former Smoker    Years: 48.00    Types: Cigarettes    Quit date: 10/18/1986    Years since quitting: 33.4  . Smokeless tobacco: Never Used  Vaping Use  . Vaping Use:  Never used  Substance and Sexual Activity  . Alcohol use: No    Alcohol/week: 0.0 standard drinks  . Drug use: No  . Sexual activity: Yes    Partners: Male    Birth control/protection: None  Other Topics Concern  . Not on file  Social History Narrative   2 caffeine drink per day.  No regular exercise.     Retired from the bank   2 children (Daughter and a son) son has morbid obesity   2 grandchildren   3 great grandchildren   Social Determinants of Radio broadcast assistant Strain:   . Difficulty of Paying Living Expenses:   Food Insecurity:   . Worried About Charity fundraiser in the Last Year:   . Arboriculturist in the Last Year:   Transportation Needs:   . Film/video editor (Medical):   Marland Kitchen Lack of Transportation (Non-Medical):   Physical Activity:   . Days of Exercise per Week:   . Minutes of Exercise per Session:   Stress:   . Feeling of Stress :   Social Connections:   . Frequency of Communication with Friends and Family:   . Frequency of Social Gatherings with Friends and Family:   . Attends Religious Services:   . Active Member of Clubs or Organizations:   . Attends Archivist Meetings:   Marland Kitchen Marital Status:   Intimate Partner Violence:   . Fear of Current or Ex-Partner:   . Emotionally Abused:   Marland Kitchen Physically Abused:   . Sexually Abused:    Current Outpatient Medications on File Prior to Visit  Medication Sig Dispense Refill  . Blood Glucose Monitoring Suppl (ACCU-CHEK GUIDE) w/Device KIT 1 kit by Does not apply route as directed. 1 kit 0  . clopidogrel (PLAVIX) 75 MG tablet     . Dulaglutide (TRULICITY) 1.5 IR/5.1OA SOPN Inject 1.5 mg into the skin once a week. Friday    . ezetimibe (ZETIA) 10 MG tablet Take 1 tablet (10 mg total) by mouth daily. 90 tablet 1  . furosemide (LASIX) 40 MG tablet TAKE 1 TABLET(40 MG) BY MOUTH DAILY (Patient taking differently: Take 40 mg by mouth daily. ) 90 tablet 2  . glucose blood (ACCU-CHEK GUIDE) test strip  Use to check blood sugars 3 times daily 100 each 12  . insulin aspart (NOVOLOG) 100 UNIT/ML injection Inject 0-15 Units into the skin 3 (three) times daily with meals. CBG 70 - 120: 0 units CBG 121 - 150: 2 units CBG 151 - 200: 3 units CBG 201 - 250: 5 units CBG 251 - 300: 8 units CBG 301 - 350: 11 units CBG 351 -  400: 15 units (Patient taking differently: Inject 90 Units into the skin daily. 90 units once a day in the morning) 10 mL 0  . latanoprost (XALATAN) 0.005 % ophthalmic solution Place 1 drop into both eyes at bedtime.     . lidocaine-prilocaine (EMLA) cream Apply 1 application topically as needed. Please apply EMLA over the Port-A-Cath site 1 hour prior to coming to our office. (Patient taking differently: Apply 1 application topically daily as needed (for port). ) 30 g 5  . loperamide (IMODIUM) 2 MG capsule Take 2 mg by mouth as needed for diarrhea or loose stools.    . mirtazapine (REMERON) 15 MG tablet Take 1 tablet (15 mg total) by mouth at bedtime. 90 tablet 0  . ONETOUCH VERIO test strip USE TO TEST BLOOD SUGAR THREE TIMES DAILY 300 each 2  . pantoprazole (PROTONIX) 40 MG tablet Take 1 tablet (40 mg total) by mouth 2 (two) times daily before a meal. 180 tablet 1  . ruxolitinib phosphate (JAKAFI) 25 MG tablet Take 25 mg by mouth 2 (two) times daily.    Marland Kitchen spironolactone (ALDACTONE) 25 MG tablet Take 0.5 tablets (12.5 mg total) by mouth daily. 15 tablet 0  . timolol (TIMOPTIC) 0.5 % ophthalmic solution Place 1 drop into both eyes daily.     Marland Kitchen triamcinolone cream (KENALOG) 0.1 % Apply 1 application topically 2 (two) times daily. (Patient taking differently: Apply 1 application topically 2 (two) times daily as needed (for rask skin). ) 30 g 0  . XIFAXAN 550 MG TABS tablet Take 1 tablet (550 mg total) by mouth 2 (two) times daily. 60 tablet 1  . citalopram (CELEXA) 10 MG tablet Take 1 tablet (10 mg total) by mouth daily. (Patient not taking: Reported on 03/28/2020) 30 tablet 0   No  current facility-administered medications on file prior to visit.   Allergies  Allergen Reactions  . Doxycycline Hives, Swelling and Rash  . Penicillins Swelling    Has patient had a PCN reaction causing immediate rash, facial/tongue/throat swelling, SOB or lightheadedness with hypotension: No Has patient had a PCN reaction causing severe rash involving mucus membranes or skin necrosis: No Has patient had a PCN reaction that required hospitalization: No Has patient had a PCN reaction occurring within the last 10 years: No If all of the above answers are "NO", then may proceed with Cephalosporin use.  Marland Kitchen Lisinopril Cough  . Tape Hives  . Latex Hives, Itching and Rash   Family History  Problem Relation Age of Onset  . Diabetes Mother   . Heart disease Mother        CAD  . Hyperlipidemia Mother   . Hypertension Mother   . Kidney disease Mother   . Kidney disease Father   . Breast cancer Maternal Aunt   . Heart disease Maternal Grandmother   . Heart disease Other        maternal aunts and uncles  . Colon cancer Neg Hx   . Esophageal cancer Neg Hx     PE: BP 120/82   Pulse 84   Ht 5' 4.5" (1.638 m)   Wt 192 lb (87.1 kg) Comment: per patient  SpO2 97%   BMI 32.45 kg/m  Body mass index is 32.45 kg/m. Wt Readings from Last 3 Encounters:  03/28/20 192 lb (87.1 kg)  03/13/20 193 lb (87.5 kg)  02/17/20 190 lb (86.2 kg)   Constitutional: overweight, in NAD, + in wheelchair Eyes: PERRLA, EOMI, no exophthalmos ENT: moist mucous  membranes, no thyromegaly, no cervical lymphadenopathy Cardiovascular: RRR, No MRG Respiratory: CTA B Gastrointestinal: abdomen soft, NT, ND, BS+ Musculoskeletal: no deformities, strength intact in all 4 Skin: moist, warm, no rashes Neurological: no tremor with outstretched hands, DTR normal in all 4  ASSESSMENT: 1. DM2, insulin-dependent, uncontrolled, with complications - nonprolif. DR - CKD - peripheral neuropathy - CAD - s/p AMI s/p 2 stents  01/2015 - Cerebrovascular disease - s/p stroke 03/2018  2. Diabetic peripheral neuropathy  3.  Obesity class I  PLAN:  1. Patient with longstanding, type 2 diabetes, very insulin resistant, on U500 insulin and weekly GLP-1 receptor agonist, returning after more than a year. -She continues to get T254 and Trulicity through the patient assistance program with [employer] -At this visit, they bring records for approximately 10 days of blood sugars checked only in the morning, fasting.  They are mostly at goal with 2 values above goal.  I mentioned the previous sugars have been approximately similar.  However, husband tells me that he had to stop her p.m. dose of insulin due to low blood sugars. -We discussed about the importance of checking sugars later in the day, but for now, I cannot make any changes in her regimen but will continue just the once a day U500 regimen. - I advised her to: Patient Instructions  Please continue: - U500 insulin; 90 units before breakfast  - Trulicity 1.5 mg once a week  Please see if you can have labs drawn at Dr. [name]' office.  Please return in 4 months with your sugar log.   -HbA1c levels are not very accurate for her due to P vera.  We will need a fructosamine level, but they would like to see whether this can be drawn at Dr. [name] office, where they are heading now.  I put the labs in.  If this cannot be drawn then, we will check it at next visit.  We will also check a lipid panel at next lab draw. - advised to check sugars at different times of the day - 2-3x a day, rotating check times - advised for yearly eye exams >> she is UTD - return to clinic in 4 months  2. Diabetic peripheral neuropathy -Stable, no increased symptoms -Continue Neurontin 600 mg twice a day -Also continue his neuropathy cream: Amitriptyline 2%, lidocaine 2%, and ketamine 1%- uses this as needed  3. Obesity class I -She lost 36 pounds since our last in person visit in  98/2641! -Continue Trulicity which should also help with weight loss.  She and her husband do not think that her abdominal pain/discomfort is caused by Trulicity.  [name], MD PhD [facility] Endocrinology

## 2020-03-28 NOTE — ED Notes (Signed)
Pt vided spontaneously while preparing for bladder scan, 150cc output, 127cc residual per scan. Urine specimen sent to lab

## 2020-03-28 NOTE — ED Notes (Signed)
Dinner Tray Ordered @1858 .

## 2020-03-28 NOTE — ED Notes (Signed)
Pt unable to take potrassium PO, attempted cutting pill in half, no success.   Orders placed for PO liquid potassium to replace pill form and address swallowing difficulty. Sat pt up to 90 degrees, pt takes single sip, spits out onto bed and self and refuses to continue. Family attempts to encourage. Again takes sip and spits onto nurse. Nurse explains importance of correcting potassium levels. Pt attempts third time with promise of "something good to drink after" and again spits sip onto bed and self.   Pt refuses to attempt again due to taste.

## 2020-03-29 DIAGNOSIS — I5032 Chronic diastolic (congestive) heart failure: Secondary | ICD-10-CM

## 2020-03-29 LAB — BASIC METABOLIC PANEL
Anion gap: 10 (ref 5–15)
Anion gap: 10 (ref 5–15)
BUN: 16 mg/dL (ref 8–23)
BUN: 17 mg/dL (ref 8–23)
CO2: 20 mmol/L — ABNORMAL LOW (ref 22–32)
CO2: 22 mmol/L (ref 22–32)
Calcium: 7.6 mg/dL — ABNORMAL LOW (ref 8.9–10.3)
Calcium: 7.9 mg/dL — ABNORMAL LOW (ref 8.9–10.3)
Chloride: 100 mmol/L (ref 98–111)
Chloride: 100 mmol/L (ref 98–111)
Creatinine, Ser: 1.41 mg/dL — ABNORMAL HIGH (ref 0.44–1.00)
Creatinine, Ser: 1.43 mg/dL — ABNORMAL HIGH (ref 0.44–1.00)
GFR calc Af Amer: 40 mL/min — ABNORMAL LOW (ref >60)
GFR calc Af Amer: 41 mL/min — ABNORMAL LOW (ref >60)
GFR calc non Af Amer: 35 mL/min — ABNORMAL LOW (ref >60)
GFR calc non Af Amer: 35 mL/min — ABNORMAL LOW (ref >60)
Glucose, Bld: 104 mg/dL — ABNORMAL HIGH (ref 70–99)
Glucose, Bld: 150 mg/dL — ABNORMAL HIGH (ref 70–99)
Potassium: 3.1 mmol/L — ABNORMAL LOW (ref 3.5–5.1)
Potassium: 3.6 mmol/L (ref 3.5–5.1)
Sodium: 130 mmol/L — ABNORMAL LOW (ref 135–145)
Sodium: 132 mmol/L — ABNORMAL LOW (ref 135–145)

## 2020-03-29 LAB — GLUCOSE, CAPILLARY
Glucose-Capillary: 100 mg/dL — ABNORMAL HIGH (ref 70–99)
Glucose-Capillary: 100 mg/dL — ABNORMAL HIGH (ref 70–99)
Glucose-Capillary: 107 mg/dL — ABNORMAL HIGH (ref 70–99)
Glucose-Capillary: 109 mg/dL — ABNORMAL HIGH (ref 70–99)
Glucose-Capillary: 141 mg/dL — ABNORMAL HIGH (ref 70–99)
Glucose-Capillary: 159 mg/dL — ABNORMAL HIGH (ref 70–99)
Glucose-Capillary: 160 mg/dL — ABNORMAL HIGH (ref 70–99)
Glucose-Capillary: 176 mg/dL — ABNORMAL HIGH (ref 70–99)
Glucose-Capillary: 61 mg/dL — ABNORMAL LOW (ref 70–99)
Glucose-Capillary: 91 mg/dL (ref 70–99)

## 2020-03-29 LAB — MRSA PCR SCREENING: MRSA by PCR: POSITIVE — AB

## 2020-03-29 LAB — CBC
HCT: 36 % (ref 36.0–46.0)
Hemoglobin: 11.6 g/dL — ABNORMAL LOW (ref 12.0–15.0)
MCH: 28.2 pg (ref 26.0–34.0)
MCHC: 32.2 g/dL (ref 30.0–36.0)
MCV: 87.4 fL (ref 80.0–100.0)
Platelets: 210 10*3/uL (ref 150–400)
RBC: 4.12 MIL/uL (ref 3.87–5.11)
RDW: 21.6 % — ABNORMAL HIGH (ref 11.5–15.5)
WBC: 17.6 10*3/uL — ABNORMAL HIGH (ref 4.0–10.5)
nRBC: 0.1 % (ref 0.0–0.2)

## 2020-03-29 LAB — BRAIN NATRIURETIC PEPTIDE: B Natriuretic Peptide: 408.1 pg/mL — ABNORMAL HIGH (ref 0.0–100.0)

## 2020-03-29 LAB — HEMOGLOBIN A1C
Hgb A1c MFr Bld: 5.4 % (ref 4.8–5.6)
Mean Plasma Glucose: 108.28 mg/dL

## 2020-03-29 LAB — MAGNESIUM: Magnesium: 1.3 mg/dL — ABNORMAL LOW (ref 1.7–2.4)

## 2020-03-29 MED ORDER — POTASSIUM CHLORIDE 20 MEQ/15ML (10%) PO SOLN
40.0000 meq | Freq: Once | ORAL | Status: DC
  Filled 2020-03-29: qty 30

## 2020-03-29 MED ORDER — MAGNESIUM SULFATE 4 GM/100ML IV SOLN
4.0000 g | Freq: Once | INTRAVENOUS | Status: AC
  Administered 2020-03-29: 4 g via INTRAVENOUS
  Filled 2020-03-29: qty 100

## 2020-03-29 MED ORDER — CHLORHEXIDINE GLUCONATE CLOTH 2 % EX PADS
6.0000 | MEDICATED_PAD | Freq: Every day | CUTANEOUS | Status: AC
  Administered 2020-03-29 – 2020-04-02 (×5): 6 via TOPICAL

## 2020-03-29 MED ORDER — POTASSIUM CHLORIDE CRYS ER 20 MEQ PO TBCR
40.0000 meq | EXTENDED_RELEASE_TABLET | Freq: Once | ORAL | Status: DC
  Filled 2020-03-29: qty 2

## 2020-03-29 MED ORDER — MUPIROCIN 2 % EX OINT
1.0000 "application " | TOPICAL_OINTMENT | Freq: Two times a day (BID) | CUTANEOUS | Status: DC
  Administered 2020-04-01: 1 via NASAL
  Filled 2020-03-29 (×3): qty 22

## 2020-03-29 MED ORDER — POTASSIUM CHLORIDE 10 MEQ/100ML IV SOLN
10.0000 meq | INTRAVENOUS | Status: AC
  Administered 2020-03-29 (×2): 10 meq via INTRAVENOUS
  Filled 2020-03-29 (×2): qty 100

## 2020-03-29 MED ORDER — ENOXAPARIN SODIUM 40 MG/0.4ML ~~LOC~~ SOLN: 40.0000 mg | SUBCUTANEOUS | Status: DC

## 2020-03-29 NOTE — Progress Notes (Signed)
Inpatient Diabetes Program Recommendations  AACE/ADA: New Consensus Statement on Inpatient Glycemic Control (2015)  Target Ranges:  Prepandial:   less than 140 mg/dL      Peak postprandial:   less than 180 mg/dL (1-2 hours)      Critically ill patients:  140 - 180 mg/dL   Lab Results  Component Value Date   GLUCAP 91 03/29/2020   HGBA1C 5.4 03/29/2020    Review of Glycemic Control  Diabetes history: DM 2, Sees Dr. Cruzita Lederer, Endocrinology (last visit 6/11) Outpatient Diabetes medications: Humulin R U-500 90 units Daily, Trulicity 1.5 mg Weekly on Fridays Current orders for Inpatient glycemic control:  Novolog 0-6 units Q4 hours  Inpatient Diabetes Program Recommendations:    Agree with current regimen.  EMS called due to pt not being able to get up in Endocrinology office after appointment. Dr. Cruzita Lederer reports pt had not checked enough glucose levels for her to redose Humulin R U-500 insulin. Pt has been very resistant to insulin in the past. Husband d/c'd evening dose of U-500 due to pt having low glucose. Will follow glucose trends and if still her Monday will contact Dr. Arman Filter office for recommendations with pt glucose trends for d/c.  Thanks,  Tama Headings RN, MSN, BC-ADM Inpatient Diabetes Coordinator Team Pager [phone] (8a-5p)

## 2020-03-29 NOTE — Progress Notes (Signed)
Glucometer not registering patient ID. [id number] CBG 107.

## 2020-03-29 NOTE — Progress Notes (Signed)
Glucometer not registering pt ID.[id number] CBG 159.

## 2020-03-29 NOTE — Progress Notes (Signed)
Patient CBG 100.  Glucometer not registering patient ID. Will continue to monitor the patient

## 2020-03-29 NOTE — Progress Notes (Signed)
PROGRESS NOTE        PATIENT DETAILS Name: Amanda [name] Age: 79 y.o. Sex: female Date of Birth: 11/16/1940 Admit Date: 03/28/2020 Admitting Physician Eugenie Filler, MD OHY:[id number], Tommi Rumps, NP  Brief Narrative: Patient is a 79 y.o. 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  See below for further details.  Significant events: 6/11>> admit to [facility] for evaluation of severe weakness-found to have severe hypoglycemia. 6/12>> no diarrhea-no BM since being in the hospital.  Significant studies: 6/11>> chest x-ray: No pneumonia/edema.  Antimicrobial therapy: None  Microbiology data: 6/11>> blood cultures: Negative  6/11>> urine culture: Pending  Procedures : None  Consults: None  DVT Prophylaxis : SCDs-given history of upper GI bleeding  Subjective: Awake/alert-wants to go home.  Denies any dysuria/lower abdominal pain/frequency-no further diarrhea since hospitalized.  Assessment/Plan: Severe hypoglycemia: In the setting of no oral intake and ongoing insulin therapy on the day of admission.  Decrease D10 infusion to 50 cc an hour-follow CBGs-encourage oral intake-we will attempt to titrate off D10 infusion.  Insulin-dependent DM-2 (A1c 5.4 on 6/94) with complications (diabetic retinopathy, CAD/CVA): All insulin products held-we will need to touch base with primary endocrinologist-likely will need adjustment of her insulin regimen.  Await resolution of hypoglycemia before restarting insulin products.  Leukocytosis: Unclear whether this is from infection or from stress margination-no obvious sources of infection apparent.  Initially C. difficile contemplated but diarrhea has essentially resolved post admission.  No pneumonia on x-ray-although  has UA suggestive of UTI-but does not have any symptoms.  WBC improving without the use of any antimicrobial agents-suspect prudent to continue to observe given stability.  Blood cultures negative so far-urine cultures pending-she is afebrile-we will continue to follow  Generalized weakness: Suspect secondary to hypoglycemia-await PT/OT eval.  Hypokalemia: Continue to replete  Hypomagnesemia: Replete and recheck tomorrow.  History of liver cirrhosis: Relatively well compensated-continue rifaximin-Lasix/spironolactone.  Minimally elevated ammonia levels but no signs of encephalopathy.  Patient stopped taking lactulose almost 3 weeks back because of diarrhea-we will follow closely.  CAD: No anginal symptoms-continue aspirin-Per H&P-Plavix discontinued in March 2021.  HTN: BP stable-continue Norvasc/Lasix/Aldactone  CKD stage IIIa: Creatinine close to baseline-follow.  History of portal vein thrombosis: Previously on anticoagulation-but discontinued due to GI bleeding.  Recent history of upper GI bleeding due to AVMs: Hemoglobin stable-no evidence of melena/hematochezia.  History of polycythemia vera/JAK2 mutation: Follows with Dr. Aundra Millet stable.  OSA-CPAP nightly  Obesity: Estimated body mass index is 31.93 kg/m as calculated from the following:   Height as of this encounter: 5' 4.5" (1.638 m).   Weight as of this encounter: 85.7 kg.    Diet: Diet Order            Diet Heart Room service appropriate? Yes with Assist; Fluid consistency: Thin  Diet effective now                  Code Status:  DNR  Family Communication: Spouse over the phone  Disposition Plan: Status is: Inpatient  Remains inpatient appropriate because:Inpatient level of care appropriate due to severity of illness  Dispo: The patient is from: Home              Anticipated d/c is to: Home  Anticipated d/c date is: 2 days              Patient currently is not medically stable  to d/c.   Barriers to Discharge: Hypoglycemia requiring D10 infusion-attempting to titrate down D10.  Antimicrobial agents: Anti-infectives (From admission, onward)   Start     Dose/Rate Route Frequency Ordered Stop   03/28/20 2200  rifaximin (XIFAXAN) tablet 550 mg     Discontinue     550 mg Oral 2 times daily 03/28/20 1837         Time spent: 35 minutes-Greater than 50% of this time was spent in counseling, explanation of diagnosis, planning of further management, and coordination of care.  MEDICATIONS: Scheduled Meds: . amLODipine  5 mg Oral Daily  . Chlorhexidine Gluconate Cloth  6 each Topical Q0600  . citalopram  10 mg Oral Daily  . clopidogrel  75 mg Oral Daily  . ezetimibe  10 mg Oral Daily  . furosemide  40 mg Oral Daily  . insulin aspart  0-6 Units Subcutaneous Q4H  . latanoprost  1 drop Both Eyes QHS  . mirtazapine  15 mg Oral QHS  . mupirocin ointment  1 application Nasal BID  . pantoprazole  40 mg Oral BID AC  . potassium chloride  40 mEq Oral Once  . potassium chloride  40 mEq Oral Once  . rifaximin  550 mg Oral BID  . ruxolitinib phosphate  25 mg Oral BID  . sodium chloride flush  3 mL Intravenous Q12H  . spironolactone  12.5 mg Oral Daily  . timolol  1 drop Both Eyes Daily   Continuous Infusions: . dextrose 10 % 1,000 mL with potassium chloride 40 mEq infusion 100 mL/hr at 03/28/20 1848  . magnesium sulfate bolus IVPB     PRN Meds:.acetaminophen **OR** acetaminophen, metoprolol tartrate, ondansetron **OR** ondansetron (ZOFRAN) IV, triamcinolone cream   PHYSICAL EXAM: Vital signs: Vitals:   03/29/20 0000 03/29/20 0407 03/29/20 0752 03/29/20 0828  BP: (!) 101/49 (!) 107/42 (!) 101/46 127/70  Pulse: (!) 57 (!) 58 62   Resp: 14 17 14    Temp: 98.1 F (36.7 C) 97.8 F (36.6 C)    TempSrc: Oral Oral    SpO2: 100% 97% 96%   Weight:      Height:       Filed Weights   03/28/20 1527 03/28/20 2100  Weight: 87.1 kg 85.7 kg   Body mass index is 31.93  kg/m.   Gen Exam:Alert awake-not in any distress HEENT:atraumatic, normocephalic Chest: B/L clear to auscultation anteriorly CVS:S1S2 regular Abdomen:soft non tender, non distended Extremities:no edema Neurology: Non focal Skin: no rash  I have personally reviewed following labs and imaging studies  LABORATORY DATA: CBC: Recent Labs  Lab 03/28/20 1527 03/29/20 0016  WBC 39.8* 17.6*  NEUTROABS 33.5*  --   HGB 13.4 11.6*  HCT 42.2 36.0  MCV 87.7 87.4  PLT 487* 010    Basic Metabolic Panel: Recent Labs  Lab 03/28/20 1527 03/28/20 1934 03/29/20 0016 03/29/20 0804  NA 136 135 130* 132*  K 2.3* 2.7* 3.1* 3.6  CL 104 101 100 100  CO2 19* 23 20* 22  GLUCOSE 25* 49* 150* 104*  BUN 18 18 17 16   CREATININE 1.56* 1.59* 1.41* 1.43*  CALCIUM 8.4* 8.2* 7.6* 7.9*  MG  --  1.3*  --  1.3*    GFR: Estimated Creatinine Clearance: 33.6 mL/min (A) (by C-G formula based on SCr of 1.43 mg/dL (H)).  Liver  Function Tests: Recent Labs  Lab 03/28/20 1527  AST 27  ALT 8  ALKPHOS 130*  BILITOT 2.5*  PROT 6.2*  ALBUMIN 2.9*   No results for input(s): LIPASE, AMYLASE in the last 168 hours. Recent Labs  Lab 03/28/20 1527  AMMONIA 62*    Coagulation Profile: No results for input(s): INR, PROTIME in the last 168 hours.  Cardiac Enzymes: No results for input(s): CKTOTAL, CKMB, CKMBINDEX, TROPONINI in the last 168 hours.  BNP (last 3 results) No results for input(s): PROBNP in the last 8760 hours.  Lipid Profile: No results for input(s): CHOL, HDL, LDLCALC, TRIG, CHOLHDL, LDLDIRECT in the last 72 hours.  Thyroid Function Tests: No results for input(s): TSH, T4TOTAL, FREET4, T3FREE, THYROIDAB in the last 72 hours.  Anemia Panel: No results for input(s): VITAMINB12, FOLATE, FERRITIN, TIBC, IRON, RETICCTPCT in the last 72 hours.  Urine analysis:    Component Value Date/Time   COLORURINE AMBER (A) 03/28/2020 2005   APPEARANCEUR TURBID (A) 03/28/2020 2005   LABSPEC  1.011 03/28/2020 2005   PHURINE 5.0 03/28/2020 2005   GLUCOSEU NEGATIVE 03/28/2020 2005   GLUCOSEU NEGATIVE 01/10/2018 1217   HGBUR MODERATE (A) 03/28/2020 2005   HGBUR large 05/07/2010 0845   BILIRUBINUR NEGATIVE 03/28/2020 2005   BILIRUBINUR neg 01/26/2016 1003   KETONESUR NEGATIVE 03/28/2020 2005   PROTEINUR 30 (A) 03/28/2020 2005   UROBILINOGEN 0.2 01/10/2018 1217   NITRITE NEGATIVE 03/28/2020 2005   LEUKOCYTESUR MODERATE (A) 03/28/2020 2005    Sepsis Labs: Lactic Acid, Venous    Component Value Date/Time   LATICACIDVEN 2.3 ([place]) 01/13/2020 0055    MICROBIOLOGY: Recent Results (from the past 240 hour(s))  SARS Coronavirus 2 by RT PCR (hospital order, performed in [place] hospital lab) Nasopharyngeal Nasopharyngeal Swab     Status: None   Collection Time: 03/28/20  5:12 PM   Specimen: Nasopharyngeal Swab  Result Value Ref Range Status   SARS Coronavirus 2 NEGATIVE NEGATIVE Final    Comment: (NOTE) SARS-CoV-2 target nucleic acids are NOT DETECTED.  The SARS-CoV-2 RNA is generally detectable in upper and lower respiratory specimens during the acute phase of infection. The lowest concentration of SARS-CoV-2 viral copies this assay can detect is 250 copies / mL. A negative result does not preclude SARS-CoV-2 infection and should not be used as the sole basis for treatment or other patient management decisions.  A negative result may occur with improper specimen collection / handling, submission of specimen other than nasopharyngeal swab, presence of viral mutation(s) within the areas targeted by this assay, and inadequate number of viral copies (<250 copies / mL). A negative result must be combined with clinical observations, patient history, and epidemiological information.  Fact Sheet for Patients:   StrictlyIdeas.no  Fact Sheet for Healthcare Providers: BankingDealers.co.za  This test is not yet approved or  cleared  by the [place] FDA and has been authorized for detection and/or diagnosis of SARS-CoV-2 by FDA under an Emergency Use Authorization (EUA).  This EUA will remain in effect (meaning this test can be used) for the duration of the COVID-19 declaration under Section 564(b)(1) of the Act, 21 U.S.C. section 360bbb-3(b)(1), unless the authorization is terminated or revoked sooner.  Performed at [facility], [place] [address] [place], [place] [address]   Blood culture (routine x 2)     Status: None (Preliminary result)   Collection Time: 03/28/20  7:34 PM   Specimen: BLOOD  Result Value Ref Range Status   Specimen Description  BLOOD RIGHT ANTECUBITAL  Final   Special Requests   Final    BOTTLES DRAWN AEROBIC ONLY Blood Culture adequate volume   Culture   Final    NO GROWTH < 12 HOURS Performed at [facility], [address], [place], [place] [address]    Report Status PENDING  Incomplete  Blood culture (routine x 2)     Status: None (Preliminary result)   Collection Time: 03/28/20  7:41 PM   Specimen: BLOOD RIGHT ARM  Result Value Ref Range Status   Specimen Description BLOOD RIGHT ARM  Final   Special Requests   Final    BOTTLES DRAWN AEROBIC AND ANAEROBIC Blood Culture adequate volume   Culture   Final    NO GROWTH < 12 HOURS Performed at [facility], [place] [address] [place], [place] [address]    Report Status PENDING  Incomplete  MRSA PCR Screening     Status: Abnormal   Collection Time: 03/28/20 11:03 PM   Specimen: Nasal Mucosa; Nasopharyngeal  Result Value Ref Range Status   MRSA by PCR POSITIVE (A) NEGATIVE Final    Comment:        The GeneXpert MRSA Assay (FDA approved for NASAL specimens only), is one component of a comprehensive MRSA colonization surveillance program. It is not intended to diagnose MRSA infection nor to guide or monitor treatment for MRSA infections. RESULT CALLED TO, READ BACK BY AND VERIFIED WITH: Q LOFTON TN 03/29/20 0034  JDW Performed at [facility], [place] [address], [place], [place] [address]     RADIOLOGY STUDIES/RESULTS: DG Chest Port 1 View  Result Date: 03/28/2020 CLINICAL DATA:  Atrial fibrillation.  Dizziness. EXAM: PORTABLE CHEST 1 VIEW COMPARISON:  Feb 17, 2020 FINDINGS: Port-A-Cath tip is in the superior vena cava. No pneumothorax. There is no edema or airspace opacity. Heart is upper normal in size with pulmonary vascularity within normal limits. No adenopathy. No bone lesions. IMPRESSION: Port-A-Cath tip in superior vena cava. No pneumothorax. No edema or airspace opacity. Stable cardiac silhouette. Electronically Signed   By: Lowella Grip III M.D.   On: 03/28/2020 16:17     LOS: 1 day   Oren Binet, MD  [facility]    To contact the attending provider between 7A-7P or the covering provider during after hours 7P-7A, please log into the web site [email or web address] and access using universal [place] password for that web site. If you do not have the password, please call the hospital operator.  03/29/2020, 10:29 AM

## 2020-03-29 NOTE — Progress Notes (Signed)
Hypoglycemic Event  CBG: 64  Treatment: D50 25 mL (12.5 gm)  Symptoms: None  Follow-up CBG: Time:2246 CBG Result:131  Possible Reasons for Event: Inadequate meal intake  Comments/MD notified: Opyd .  CBG originally 61 at 2122.  Attempted PO intake but patient refused to finish apple juice.      Amanda [name]

## 2020-03-29 NOTE — Evaluation (Signed)
Physical Therapy Evaluation Patient Details Name: Amanda [name] MRN: [id number] DOB: 05/13/1941 Today's Date: 03/29/2020   History of Present Illness  Amanda [name] is a 79 y.o. female with medical history significant of type 2 diabetes mellitus diagnosed in 2008 insulin-dependent since diagnosis which is poorly controlled with complications of diabetic retinopathy, chronic kidney disease, peripheral neuropathy, history of coronary artery disease status post stents, history of CVA(03/2018), prior history of C. difficile colitis, depression, cirrhosis, hyperlipidemia, polycythemia, myeloproliferative disorder who presents to the ED from endocrinologist office with weakness due to hypoglycemia.  Clinical Impression  Pt admitted with above diagnosis. PT eval limited by pt dizziness. BP in supine 110/85, in sitting 109/52 and pt could not attempt transfers or maintain sitting EOB. Bed placed in chair position and pt could only tolerate HOB 54 deg. At this point, recommend SNF at d/c but pt reports that she will not go anywhere but home. Husband is home with her but could not safely give her more than min A. Pt required max A for bed mobility today.  Pt currently with functional limitations due to the deficits listed below (see PT Problem List). Pt will benefit from skilled PT to increase their independence and safety with mobility to allow discharge to the venue listed below.       Follow Up Recommendations SNF (pt currently stating that she will not go to SNF)    Equipment Recommendations  None recommended by PT    Recommendations for Other Services OT consult     Precautions / Restrictions Precautions Precautions: Fall Precaution Comments: watch BP Restrictions Weight Bearing Restrictions: No      Mobility  Bed Mobility Overal bed mobility: Needs Assistance Bed Mobility: Supine to Sit;Sit to Supine     Supine to sit: Mod assist Sit to supine: Max assist   General bed mobility  comments: min A for LE's off EOB and mod A for elevation of trunk into sitting. Max A for LE's and positioning for return to supine. Pt able to assist scooting up in bed by pushing with feet  Transfers Overall transfer level: Needs assistance               General transfer comment: unable to attempt due to dizziness. Attempted scooting along EOB and pt unable  Ambulation/Gait             General Gait Details: unable  Stairs            Wheelchair Mobility    Modified Rankin (Stroke Patients Only)       Balance Overall balance assessment: Needs assistance Sitting-balance support: Single extremity supported;Feet supported Sitting balance-Leahy Scale: Poor Sitting balance - Comments: needed min A to maintain sitting due to dizziness, posterior lean noted Postural control: Posterior lean                                   Pertinent Vitals/Pain Pain Assessment: Faces Faces Pain Scale: Hurts little more Pain Location: generalized Pain Descriptors / Indicators: Aching Pain Intervention(s): Limited activity within patient's tolerance;Monitored during session;Repositioned    Home Living Family/patient expects to be discharged to:: Private residence Living Arrangements: Spouse/significant other Available Help at Discharge: Family;Available PRN/intermittently Type of Home: Apartment Home Access: Level entry     Home Layout: One level Home Equipment: Walker - 2 wheels;Walker - 4 wheels;Cane - single point;Bedside commode Additional Comments: lives in apt with husband, has caregiver  come 1 hr/ day. Reports she was getting HHPT    Prior Function Level of Independence: Needs assistance               Hand Dominance   Dominant Hand: Right    Extremity/Trunk Assessment   Upper Extremity Assessment Upper Extremity Assessment: Generalized weakness    Lower Extremity Assessment Lower Extremity Assessment: Generalized weakness    Cervical /  Trunk Assessment Cervical / Trunk Assessment: Kyphotic  Communication   Communication: No difficulties  Cognition Arousal/Alertness: Awake/alert Behavior During Therapy: Anxious Overall Cognitive Status: Within Functional Limits for tasks assessed                                 General Comments: WFl for basic functions, higher executive function not tested       General Comments General comments (skin integrity, edema, etc.): BP supine 110/85 sitting 109/52. Pt's bed placed in chair position after session to increase tolerance for upright but pt could not tolerate HOB 60 deg, able to tolerate 54 deg.     Exercises General Exercises - Lower Extremity Ankle Circles/Pumps: AROM;Both;10 reps;Supine Long Arc Quad: AROM;Both;10 reps;Seated   Assessment/Plan    PT Assessment Patient needs continued PT services  PT Problem List Decreased strength;Decreased activity tolerance;Decreased balance;Decreased mobility;Cardiopulmonary status limiting activity       PT Treatment Interventions DME instruction;Gait training;Functional mobility training;Therapeutic activities;Therapeutic exercise;Balance training;Patient/family education    PT Goals (Current goals can be found in the Care Plan section)  Acute Rehab PT Goals Patient Stated Goal: return home PT Goal Formulation: With patient Time For Goal Achievement: 04/12/20 Potential to Achieve Goals: Fair    Frequency Min 3X/week   Barriers to discharge        Co-evaluation               AM-PAC PT "6 Clicks" Mobility  Outcome Measure Help needed turning from your back to your side while in a flat bed without using bedrails?: A Lot Help needed moving from lying on your back to sitting on the side of a flat bed without using bedrails?: A Lot Help needed moving to and from a bed to a chair (including a wheelchair)?: Total Help needed standing up from a chair using your arms (e.g., wheelchair or bedside chair)?:  Total Help needed to walk in hospital room?: Total Help needed climbing 3-5 steps with a railing? : Total 6 Click Score: 8    End of Session   Activity Tolerance: Treatment limited secondary to medical complications (Comment) (dizziness) Patient left: in bed;with call bell/phone within reach;with bed alarm set;with family/visitor present Nurse Communication: Mobility status (dizziness) PT Visit Diagnosis: Dizziness and giddiness (R42);Muscle weakness (generalized) (M62.81)    Time: 0867-6195 PT Time Calculation (min) (ACUTE ONLY): 40 min   Charges:   PT Evaluation $PT Eval Moderate Complexity: 1 Mod PT Treatments $Therapeutic Activity: 23-37 mins        Leighton Roach, [place]  Pager [phone] Office [place] 03/29/2020, 3:50 PM

## 2020-03-30 LAB — GLUCOSE, CAPILLARY
Glucose-Capillary: 104 mg/dL — ABNORMAL HIGH (ref 70–99)
Glucose-Capillary: 107 mg/dL — ABNORMAL HIGH (ref 70–99)
Glucose-Capillary: 132 mg/dL — ABNORMAL HIGH (ref 70–99)
Glucose-Capillary: 160 mg/dL — ABNORMAL HIGH (ref 70–99)
Glucose-Capillary: 98 mg/dL (ref 70–99)

## 2020-03-30 LAB — URINE CULTURE: Culture: >=100000 — AB

## 2020-03-30 LAB — CBC
HCT: 33.7 % — ABNORMAL LOW (ref 36.0–46.0)
Hemoglobin: 10.6 g/dL — ABNORMAL LOW (ref 12.0–15.0)
MCH: 28.2 pg (ref 26.0–34.0)
MCHC: 31.5 g/dL (ref 30.0–36.0)
MCV: 89.6 fL (ref 80.0–100.0)
Platelets: 193 K/uL (ref 150–400)
RBC: 3.76 MIL/uL — ABNORMAL LOW (ref 3.87–5.11)
RDW: 21.5 % — ABNORMAL HIGH (ref 11.5–15.5)
WBC: 15.1 K/uL — ABNORMAL HIGH (ref 4.0–10.5)
nRBC: 0 % (ref 0.0–0.2)

## 2020-03-30 LAB — COMPREHENSIVE METABOLIC PANEL WITH GFR
ALT: 8 U/L (ref 0–44)
AST: 27 U/L (ref 15–41)
Albumin: 2.4 g/dL — ABNORMAL LOW (ref 3.5–5.0)
Alkaline Phosphatase: 108 U/L (ref 38–126)
Anion gap: 8 (ref 5–15)
BUN: 14 mg/dL (ref 8–23)
CO2: 22 mmol/L (ref 22–32)
Calcium: 8.1 mg/dL — ABNORMAL LOW (ref 8.9–10.3)
Chloride: 104 mmol/L (ref 98–111)
Creatinine, Ser: 1.39 mg/dL — ABNORMAL HIGH (ref 0.44–1.00)
GFR calc Af Amer: 41 mL/min — ABNORMAL LOW
GFR calc non Af Amer: 36 mL/min — ABNORMAL LOW
Glucose, Bld: 104 mg/dL — ABNORMAL HIGH (ref 70–99)
Potassium: 4 mmol/L (ref 3.5–5.1)
Sodium: 134 mmol/L — ABNORMAL LOW (ref 135–145)
Total Bilirubin: 2.2 mg/dL — ABNORMAL HIGH (ref 0.3–1.2)
Total Protein: 4.9 g/dL — ABNORMAL LOW (ref 6.5–8.1)

## 2020-03-30 LAB — MAGNESIUM: Magnesium: 2.3 mg/dL (ref 1.7–2.4)

## 2020-03-30 NOTE — Progress Notes (Signed)
Inpatient Diabetes Program Recommendations  AACE/ADA: New Consensus Statement on Inpatient Glycemic Control (2015)  Target Ranges:  Prepandial:   less than 140 mg/dL      Peak postprandial:   less than 180 mg/dL (1-2 hours)      Critically ill patients:  140 - 180 mg/dL   Lab Results  Component Value Date   GLUCAP 98 03/30/2020   HGBA1C 5.4 03/29/2020    Review of Glycemic Control  Diabetes history: DM 2, Sees Dr. Cruzita Lederer, Endocrinology (last visit 6/11) Outpatient Diabetes medications: Humulin R U-500 90 units Daily, Trulicity 1.5 mg Weekly on Fridays Current orders for Inpatient glycemic control:  Novolog 0-6 units Q4 hours  Inpatient Diabetes Program Recommendations:    Sent Secure chat to Dr. Benjiman Core, pt's Endocrinologist, so she will see in the morning for recs on pt's insulin regimen and follow up. Will also call her office tomorrow on 6/14.  Thanks,  Tama Headings RN, MSN, BC-ADM Inpatient Diabetes Coordinator Team Pager [phone] (8a-5p)

## 2020-03-30 NOTE — Evaluation (Signed)
Occupational Therapy Evaluation Patient Details Name: Amanda [name] MRN: [id number] DOB: 1941-07-05 Today's Date: 03/30/2020    History of Present Illness Amanda [name] is a 79 y.o. female with medical history significant of type 2 diabetes mellitus diagnosed in 2008 insulin-dependent since diagnosis which is poorly controlled with complications of diabetic retinopathy, chronic kidney disease, peripheral neuropathy, history of coronary artery disease status post stents, history of CVA(03/2018), prior history of C. difficile colitis, depression, cirrhosis, hyperlipidemia, polycythemia, myeloproliferative disorder who presents to the ED from endocrinologist office with weakness due to hypoglycemia.   Clinical Impression   This 79 y/o female presents with the above. PTA pt living at home with spouse, reports over last 6 months has been using w/c for household mobility (completing stand pivot transfers with RW) and spouse assisting with LB ADL. Pt currently presenting with overall weakness and decreased mobility status impacting her functional performance. Pt currently requiring up to maxA for bed mobility, tolerating sitting EOB approx 10 min at minguard-supervision level while completing basic ADL. She requires up to [place] for LB/toileting ADL at bed level; pt declined attempts to stand or mobilize beyond EOB today though suspect may require two person assist initially. She will benefit from continued acute OT services and given current deficits recommend follow up OT services in SNF setting to maximize her overall safety and independence with ADL and mobility as well as to decrease level of caregiver burden prior to returning home.   BP in supine: 117/63 Seated EOB 125/59 (denies dizziness with sitting upright)     Follow Up Recommendations  SNF;Supervision/Assistance - 24 hour    Equipment Recommendations  Other (comment);3 in 1 bedside commode (TBD)           Precautions / Restrictions  Precautions Precautions: Fall Precaution Comments: watch BP Restrictions Weight Bearing Restrictions: No      Mobility Bed Mobility Overal bed mobility: Needs Assistance Bed Mobility: Supine to Sit;Sit to Supine     Supine to sit: Mod assist Sit to supine: Max assist   General bed mobility comments: assist to scoot hips to EOB and elevate trunk. assist for LEs onto bed and to boost to [facility]  Transfers Overall transfer level: Needs assistance               General transfer comment: pt declined attempts - discussed that therapy would likely return with +2 assist later this week to attempt to stand and pt in agreement     Balance Overall balance assessment: Needs assistance Sitting-balance support: Single extremity supported;Feet supported Sitting balance-Leahy Scale: Poor Sitting balance - Comments: pt with preference for UE support on bedrail, able to maintain without UE support and minguard assist                                    ADL either performed or assessed with clinical judgement   ADL Overall ADL's : Needs assistance/impaired Eating/Feeding: Set up;Sitting;Bed level   Grooming: Brushing hair;Supervision/safety;Sitting Grooming Details (indicate cue type and reason): seated EOB Upper Body Bathing: Minimal assistance;Sitting   Lower Body Bathing: Maximal assistance;Sitting/lateral leans;Bed level   Upper Body Dressing : Minimal assistance;Sitting   Lower Body Dressing: Total assistance;Sitting/lateral leans;Bed level       Toileting- Clothing Manipulation and Hygiene: Total assistance;Bed level         General ADL Comments: pt tolerating sitting EOB aprpox 10 min during session, would not attempt  to stand today     Vision         Perception     Praxis      Pertinent Vitals/Pain Pain Assessment: Faces Faces Pain Scale: Hurts a little bit Pain Location: generalized Pain Descriptors / Indicators: Aching Pain Intervention(s):  Monitored during session;Repositioned     Hand Dominance Right   Extremity/Trunk Assessment Upper Extremity Assessment Upper Extremity Assessment: Generalized weakness   Lower Extremity Assessment Lower Extremity Assessment: Defer to PT evaluation   Cervical / Trunk Assessment Cervical / Trunk Assessment: Kyphotic   Communication Communication Communication: No difficulties   Cognition Arousal/Alertness: Awake/alert Behavior During Therapy: Anxious Overall Cognitive Status: Within Functional Limits for tasks assessed                                 General Comments: for basic tasks - pt often looking to spouse for answers to PLOF questions - will benefit from further assessment    General Comments       Exercises Exercises: General Lower Extremity General Exercises - Lower Extremity Ankle Circles/Pumps: AROM;Both;10 reps;Supine Long Arc Quad: AROM;Both;10 reps;Seated   Shoulder Instructions      Home Living Family/patient expects to be discharged to:: Private residence Living Arrangements: Spouse/significant other Available Help at Discharge: Family;Available PRN/intermittently Type of Home: Apartment Home Access: Level entry     Home Layout: One level     Bathroom Shower/Tub: Teacher, early years/pre: Handicapped height     Home Equipment: Environmental consultant - 2 wheels;Walker - 4 wheels;Cane - single point;Bedside commode;Tub bench   Additional Comments: lives in apt with husband, has caregiver come 1 hr/ day. Reports she was getting HHPT      Prior Functioning/Environment Level of Independence: Needs assistance  Gait / Transfers Assistance Needed: reports use of w/c mostly, standing/transferring using RW ADL's / Homemaking Assistance Needed: spouse assists with LB ADL            OT Problem List: Decreased strength;Decreased range of motion;Decreased activity tolerance;Impaired balance (sitting and/or standing);Decreased  cognition;Decreased safety awareness;Obesity;Decreased knowledge of use of DME or AE      OT Treatment/Interventions: Self-care/ADL training;Therapeutic exercise;Energy conservation;DME and/or AE instruction;Therapeutic activities;Cognitive remediation/compensation;Patient/family education;Balance training    OT Goals(Current goals can be found in the care plan section) Acute Rehab OT Goals Patient Stated Goal: return home OT Goal Formulation: With patient Time For Goal Achievement: 04/13/20 Potential to Achieve Goals: Good  OT Frequency: Min 2X/week   Barriers to D/C:            Co-evaluation              AM-PAC OT "6 Clicks" Daily Activity     Outcome Measure Help from another person eating meals?: A Little Help from another person taking care of personal grooming?: A Little Help from another person toileting, which includes using toliet, bedpan, or urinal?: Total Help from another person bathing (including washing, rinsing, drying)?: A Lot Help from another person to put on and taking off regular upper body clothing?: A Little Help from another person to put on and taking off regular lower body clothing?: Total 6 Click Score: 13   End of Session Nurse Communication: Mobility status  Activity Tolerance: Patient tolerated treatment well Patient left: in bed;with call bell/phone within reach;with bed alarm set;with family/visitor present  OT Visit Diagnosis: Other abnormalities of gait and mobility (R26.89);Muscle weakness (generalized) (M62.81)  Time: 1349-1416 OT Time Calculation (min): 27 min Charges:  OT General Charges $OT Visit: 1 Visit OT Evaluation $OT Eval Moderate Complexity: 1 Mod OT Treatments $Self Care/Home Management : 8-22 mins  Lou Cal, OT Acute Rehabilitation Services Pager [phone] Office [place] 03/30/2020, 5:18 PM

## 2020-03-30 NOTE — Progress Notes (Addendum)
We will ask                        PROGRESS NOTE        PATIENT DETAILS Name: Amanda [name] Age: 79 y.o. Sex: female Date of Birth: [birth year] Admit Date: 03/28/2020 Admitting Physician Eugenie Filler, MD QJF:[id number], Tommi Rumps, NP  Brief Narrative: Patient is a 79 y.o. 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  See below for further details.  Significant events: 6/11>> admit to [facility] for evaluation of severe weakness-found to have severe hypoglycemia. 6/12>> no diarrhea-no BM since being in the hospital.  Significant studies: 6/11>> chest x-ray: No pneumonia/edema.  Antimicrobial therapy: None  Microbiology data: 6/11>> blood cultures: Negative  6/11>> urine culture:KLEBSIELLA ORNITHINOLYTICA  Procedures : None  Consults: None  DVT Prophylaxis : SCDs-given history of upper GI bleeding  Subjective: Asking for discharge-but when seen by physical therapy yesterday-cream weekly and SNF was recommended.  Diarrhea has resolved.  No fever.  No dysuria or frequency of urination.  Assessment/Plan: Severe hypoglycemia: In the setting of no oral intake and ongoing insulin therapy on the day of admission.  Unfortunately did not follow-up with primary endocrinologist for the past few months.  Suspect was all ready having hypoglycemia at home-spouse decreased U5 100 dosing to daily dosing.  CBGs currently stable without long-acting insulin.  No longer on D10 infusion.    Insulin-dependent DM-2 (A1c 5.4 on 3/89) with complications (diabetic retinopathy, CAD/CVA): Initially all insulin products were held due to severe hypoglycemia-CBGs currently stable with just SSI.  Had spoken with Dr. Letta Median 6/12 over the phone-recommendations were for Lantus (15-25 units) and SSI on discharge.   However CBGs stable this morning with just SSI-we will continue to follow-may not even need Lantus on discharge.  CBG (last 3)  Recent Labs    03/30/20 0011 03/30/20 0403 03/30/20 0808  GLUCAP 104* 107* 98   Leukocytosis: Downtrending-likely from stress margination-no obvious sources of infection apparent.  Although has urine culture positive-she has no symptoms of UTI-likely reflects asymptomatic bacteriuria.  Blood cultures remain negative.  Chest x-ray without pneumonia.  Diarrhea has resolved-unlikely to have C. difficile.  Since WBC downtrending on its own-patient very stable-continue to monitor off antimicrobial therapy  Appears to have some amount of chronic leukocytosis at baseline.  Generalized weakness: Suspect secondary to hypoglycemia-but appears to have some amount of chronic debility at baseline-PT/OT eval appreciated-recommendations are for SNF.  Hypokalemia: Repleted.  Hypomagnesemia: Repleted  History of liver cirrhosis: Relatively well compensated-continue rifaximin-Lasix/spironolactone.  Minimally elevated ammonia levels but no signs of encephalopathy.  Patient stopped taking lactulose almost 3 weeks back because of diarrhea-we will follow closely.  CAD: No anginal symptoms-continue aspirin-Per H&P-Plavix discontinued in March 2021.  HTN: BP stable-continue Norvasc/Lasix/Aldactone  CKD stage IIIa: Creatinine close to baseline-follow.  History of portal vein thrombosis: Previously on anticoagulation-but discontinued due to GI bleeding.  Recent history of upper GI bleeding due to AVMs: Hemoglobin stable-no evidence of melena/hematochezia.  History of polycythemia vera/JAK2 mutation: Follows with Dr. Aundra Millet stable.  OSA-CPAP nightly  Obesity: Estimated body mass index is 31.93 kg/m as calculated from the following:   Height as of this encounter: 5' 4.5" (1.638 m).   Weight as of this encounter: 85.7 kg.    Diet: Diet Order  Diet  Heart Room service appropriate? Yes with Assist; Fluid consistency: Thin  Diet effective now                  Code Status:  DNR  Family Communication: Spouse at bedside  Disposition Plan: Status is: Inpatient  Remains inpatient appropriate because:Inpatient level of care appropriate due to severity of illness  Dispo: The patient is from: Home              Anticipated d/c is to: Home              Anticipated d/c date is: 2 days              Patient currently is not medically stable to d/c.   Barriers to Discharge: Unsafe disposition-PT/OT recommending SNF.  Watch CBGs for another day and decide whether to initiate long-acting insulin.    Antimicrobial agents: Anti-infectives (From admission, onward)   Start     Dose/Rate Route Frequency Ordered Stop   03/28/20 2200  rifaximin (XIFAXAN) tablet 550 mg     Discontinue     550 mg Oral 2 times daily 03/28/20 1837         Time spent: 25 minutes-Greater than 50% of this time was spent in counseling, explanation of diagnosis, planning of further management, and coordination of care.  MEDICATIONS: Scheduled Meds: . amLODipine  5 mg Oral Daily  . Chlorhexidine Gluconate Cloth  6 each Topical Q0600  . citalopram  10 mg Oral Daily  . clopidogrel  75 mg Oral Daily  . ezetimibe  10 mg Oral Daily  . furosemide  40 mg Oral Daily  . insulin aspart  0-6 Units Subcutaneous Q4H  . latanoprost  1 drop Both Eyes QHS  . mirtazapine  15 mg Oral QHS  . mupirocin ointment  1 application Nasal BID  . pantoprazole  40 mg Oral BID AC  . rifaximin  550 mg Oral BID  . ruxolitinib phosphate  25 mg Oral BID  . sodium chloride flush  3 mL Intravenous Q12H  . spironolactone  12.5 mg Oral Daily  . timolol  1 drop Both Eyes Daily   Continuous Infusions:  PRN Meds:.acetaminophen **OR** acetaminophen, metoprolol tartrate, ondansetron **OR** ondansetron (ZOFRAN) IV, triamcinolone cream   PHYSICAL EXAM: Vital signs: Vitals:   03/30/20 0408  03/30/20 0652 03/30/20 0837 03/30/20 1200  BP:  (!) 118/94 131/65 (!) 120/56  Pulse:  70 91 74  Resp:  16 18 20   Temp: 97.8 F (36.6 C)  97.6 F (36.4 C) 98 F (36.7 C)  TempSrc: Oral  Oral Axillary  SpO2:  100% 100% 100%  Weight:      Height:       Filed Weights   03/28/20 1527 03/28/20 2100  Weight: 87.1 kg 85.7 kg   Body mass index is 31.93 kg/m.   Gen Exam:Alert awake-not in any distress HEENT:atraumatic, normocephalic Chest: B/L clear to auscultation anteriorly CVS:S1S2 regular Abdomen:soft non tender, non distended Extremities:no edema Neurology: Non focal Skin: no rash  I have personally reviewed following labs and imaging studies  LABORATORY DATA: CBC: Recent Labs  Lab 03/28/20 1527 03/29/20 0016 03/30/20 0515  WBC 39.8* 17.6* 15.1*  NEUTROABS 33.5*  --   --   HGB 13.4 11.6* 10.6*  HCT 42.2 36.0 33.7*  MCV 87.7 87.4 89.6  PLT 487* 210 981    Basic Metabolic Panel: Recent Labs  Lab 03/28/20 1527 03/28/20 1934 03/29/20 0016 03/29/20 0804 03/30/20 0515  NA 136 135 130* 132* 134*  K 2.3* 2.7* 3.1* 3.6 4.0  CL 104 101 100 100 104  CO2 19* 23 20* 22 22  GLUCOSE 25* 49* 150* 104* 104*  BUN 18 18 17 16 14   CREATININE 1.56* 1.59* 1.41* 1.43* 1.39*  CALCIUM 8.4* 8.2* 7.6* 7.9* 8.1*  MG  --  1.3*  --  1.3* 2.3    GFR: Estimated Creatinine Clearance: 34.6 mL/min (A) (by C-G formula based on SCr of 1.39 mg/dL (H)).  Liver Function Tests: Recent Labs  Lab 03/28/20 1527 03/30/20 0515  AST 27 27  ALT 8 8  ALKPHOS 130* 108  BILITOT 2.5* 2.2*  PROT 6.2* 4.9*  ALBUMIN 2.9* 2.4*   No results for input(s): LIPASE, AMYLASE in the last 168 hours. Recent Labs  Lab 03/28/20 1527  AMMONIA 62*    Coagulation Profile: No results for input(s): INR, PROTIME in the last 168 hours.  Cardiac Enzymes: No results for input(s): CKTOTAL, CKMB, CKMBINDEX, TROPONINI in the last 168 hours.  BNP (last 3 results) No results for input(s): PROBNP in the last  8760 hours.  Lipid Profile: No results for input(s): CHOL, HDL, LDLCALC, TRIG, CHOLHDL, LDLDIRECT in the last 72 hours.  Thyroid Function Tests: No results for input(s): TSH, T4TOTAL, FREET4, T3FREE, THYROIDAB in the last 72 hours.  Anemia Panel: No results for input(s): VITAMINB12, FOLATE, FERRITIN, TIBC, IRON, RETICCTPCT in the last 72 hours.  Urine analysis:    Component Value Date/Time   COLORURINE AMBER (A) 03/28/2020 2005   APPEARANCEUR TURBID (A) 03/28/2020 2005   LABSPEC 1.011 03/28/2020 2005   PHURINE 5.0 03/28/2020 2005   GLUCOSEU NEGATIVE 03/28/2020 2005   GLUCOSEU NEGATIVE 01/10/2018 1217   HGBUR MODERATE (A) 03/28/2020 2005   HGBUR large 05/07/2010 0845   BILIRUBINUR NEGATIVE 03/28/2020 2005   BILIRUBINUR neg 01/26/2016 1003   KETONESUR NEGATIVE 03/28/2020 2005   PROTEINUR 30 (A) 03/28/2020 2005   UROBILINOGEN 0.2 01/10/2018 1217   NITRITE NEGATIVE 03/28/2020 2005   LEUKOCYTESUR MODERATE (A) 03/28/2020 2005    Sepsis Labs: Lactic Acid, Venous    Component Value Date/Time   LATICACIDVEN 2.3 ([place]) 01/13/2020 0055    MICROBIOLOGY: Recent Results (from the past 240 hour(s))  SARS Coronavirus 2 by RT PCR (hospital order, performed in [place] hospital lab) Nasopharyngeal Nasopharyngeal Swab     Status: None   Collection Time: 03/28/20  5:12 PM   Specimen: Nasopharyngeal Swab  Result Value Ref Range Status   SARS Coronavirus 2 NEGATIVE NEGATIVE Final    Comment: (NOTE) SARS-CoV-2 target nucleic acids are NOT DETECTED.  The SARS-CoV-2 RNA is generally detectable in upper and lower respiratory specimens during the acute phase of infection. The lowest concentration of SARS-CoV-2 viral copies this assay can detect is 250 copies / mL. A negative result does not preclude SARS-CoV-2 infection and should not be used as the sole basis for treatment or other patient management decisions.  A negative result may occur with improper specimen collection / handling,  submission of specimen other than nasopharyngeal swab, presence of viral mutation(s) within the areas targeted by this assay, and inadequate number of viral copies (<250 copies / mL). A negative result must be combined with clinical observations, patient history, and epidemiological information.  Fact Sheet for Patients:   StrictlyIdeas.no  Fact Sheet for Healthcare Providers: BankingDealers.co.za  This test is not yet approved or  cleared by the [place] FDA and has been authorized for detection and/or diagnosis of SARS-CoV-2 by  FDA under an Emergency Use Authorization (EUA).  This EUA will remain in effect (meaning this test can be used) for the duration of the COVID-19 declaration under Section 564(b)(1) of the Act, 21 U.S.C. section 360bbb-3(b)(1), unless the authorization is terminated or revoked sooner.  Performed at [facility], [place] [address], [place], [place] [address]   Blood culture (routine x 2)     Status: None (Preliminary result)   Collection Time: 03/28/20  7:34 PM   Specimen: BLOOD  Result Value Ref Range Status   Specimen Description BLOOD RIGHT ANTECUBITAL  Final   Special Requests   Final    BOTTLES DRAWN AEROBIC ONLY Blood Culture adequate volume   Culture   Final    NO GROWTH 2 DAYS Performed at [facility], [place] [address], [place], [place] [address]    Report Status PENDING  Incomplete  Blood culture (routine x 2)     Status: None (Preliminary result)   Collection Time: 03/28/20  7:41 PM   Specimen: BLOOD RIGHT ARM  Result Value Ref Range Status   Specimen Description BLOOD RIGHT ARM  Final   Special Requests   Final    BOTTLES DRAWN AEROBIC AND ANAEROBIC Blood Culture adequate volume   Culture   Final    NO GROWTH 2 DAYS Performed at [facility], [place] [address], [place], [place] [address]    Report Status PENDING  Incomplete  Urine Culture     Status: Abnormal   Collection  Time: 03/28/20  8:19 PM   Specimen: Urine, Catheterized  Result Value Ref Range Status   Specimen Description URINE, CATHETERIZED  Final   Special Requests   Final    NONE Performed at [facility], [place] [address], [place], [place] [address]    Culture >=100,000 COLONIES/mL KLEBSIELLA ORNITHINOLYTICA (A)  Final   Report Status 03/30/2020 FINAL  Final   Organism ID, Bacteria KLEBSIELLA ORNITHINOLYTICA (A)  Final      Susceptibility   Klebsiella ornithinolytica - MIC*    AMPICILLIN RESISTANT Resistant     CEFAZOLIN <=4 SENSITIVE Sensitive     CEFTRIAXONE <=1 SENSITIVE Sensitive     CIPROFLOXACIN <=0.25 SENSITIVE Sensitive     GENTAMICIN <=1 SENSITIVE Sensitive     IMIPENEM <=0.25 SENSITIVE Sensitive     NITROFURANTOIN 64 INTERMEDIATE Intermediate     TRIMETH/SULFA <=20 SENSITIVE Sensitive     AMPICILLIN/SULBACTAM 4 SENSITIVE Sensitive     PIP/TAZO <=4 SENSITIVE Sensitive     * >=100,000 COLONIES/mL KLEBSIELLA ORNITHINOLYTICA  MRSA PCR Screening     Status: Abnormal   Collection Time: 03/28/20 11:03 PM   Specimen: Nasal Mucosa; Nasopharyngeal  Result Value Ref Range Status   MRSA by PCR POSITIVE (A) NEGATIVE Final    Comment:        The GeneXpert MRSA Assay (FDA approved for NASAL specimens only), is one component of a comprehensive MRSA colonization surveillance program. It is not intended to diagnose MRSA infection nor to guide or monitor treatment for MRSA infections. RESULT CALLED TO, READ BACK BY AND VERIFIED WITH: Q LOFTON TN 03/29/20 0034 JDW Performed at [facility], [place] [address], [place], [place] [address]     RADIOLOGY STUDIES/RESULTS: DG Chest Port 1 View  Result Date: 03/28/2020 CLINICAL DATA:  Atrial fibrillation.  Dizziness. EXAM: PORTABLE CHEST 1 VIEW COMPARISON:  Feb 17, 2020 FINDINGS: Port-A-Cath tip is in the superior vena cava. No pneumothorax. There is no edema or  airspace opacity. Heart is upper normal in size with pulmonary vascularity  within normal limits. No adenopathy. No bone lesions. IMPRESSION: Port-A-Cath tip in superior vena cava. No pneumothorax. No edema or airspace opacity. Stable cardiac silhouette. Electronically Signed   By: Lowella Grip III M.D.   On: 03/28/2020 16:17     LOS: 2 days   Oren Binet, MD  [facility]    To contact the attending provider between 7A-7P or the covering provider during after hours 7P-7A, please log into the web site [email or web address] and access using universal [place] password for that web site. If you do not have the password, please call the hospital operator.  03/30/2020, 3:26 PM

## 2020-03-31 LAB — GLUCOSE, CAPILLARY
Glucose-Capillary: 94 mg/dL (ref 70–99)
Glucose-Capillary: 138 mg/dL — ABNORMAL HIGH (ref 70–99)

## 2020-03-31 MED ORDER — FUROSEMIDE 20 MG PO TABS
20.0000 mg | ORAL_TABLET | Freq: Every day | ORAL | Status: DC
  Administered 2020-04-02: 20 mg via ORAL
  Filled 2020-03-31: qty 1

## 2020-03-31 MED ORDER — SPIRONOLACTONE 12.5 MG HALF TABLET
12.5000 mg | ORAL_TABLET | Freq: Every day | ORAL | Status: DC
  Administered 2020-04-02: 12.5 mg via ORAL
  Filled 2020-03-31: qty 1

## 2020-03-31 MED ORDER — FUROSEMIDE 20 MG PO TABS: 20.0000 mg | ORAL_TABLET | Freq: Every day | ORAL | Status: DC

## 2020-03-31 NOTE — Progress Notes (Signed)
Reported patient had 7 beat run of wide QRS.  Patient is currently resting peacefully.  Notified on call MD.  Will continue to monitor the patient

## 2020-03-31 NOTE — Progress Notes (Signed)
   03/31/20 0413  Assess: MEWS Score  Temp 98.6 F (37 C)  BP (!) 93/53  Pulse Rate 65  ECG Heart Rate 65  Resp 13  Level of Consciousness Alert  SpO2 97 %  O2 Device Room Air  Patient Activity (if Appropriate) In bed  Assess: MEWS Score  MEWS Temp 0  MEWS Systolic 1  MEWS Pulse 0  MEWS RR 1  MEWS LOC 0  MEWS Score 2  MEWS Score Color Yellow  Assess: if the MEWS score is Yellow or Red  Were vital signs taken at a resting state? Yes  Focused Assessment Documented focused assessment  Early Detection of Sepsis Score *See Row Information* Medium  MEWS guidelines implemented *See Row Information* Yes  Treat  MEWS Interventions Escalated (See documentation below)  Take Vital Signs  Increase Vital Sign Frequency  Yellow: Q 2hr X 2 then Q 4hr X 2, if remains yellow, continue Q 4hrs  Escalate  MEWS: Escalate Yellow: discuss with charge nurse/RN and consider discussing with provider and RRT  Notify: Charge Nurse/RN  Name of Charge Nurse/RN Notified Lexine Baton F  Date Charge Nurse/RN Notified 03/31/20  Time Charge Nurse/RN Notified [id number]  Notify: Provider  Provider Name/Title T. Oypd, MD  Date Provider Notified 03/31/20  Time Provider Notified [phone]  Notification Type Page  Notification Reason Other (Comment) (MEWS turned yellow )

## 2020-03-31 NOTE — NC FL2 (Signed)
[place] LEVEL OF CARE SCREENING TOOL     IDENTIFICATION  Patient Name: Amanda [name] Birthdate: [birth year] Sex: female Admission Date (Current Location): 03/28/2020  [facility] and [place] Number:  Herbalist and Address:  [facility], [place] [address] [place], [place] [address]      Provider Number: [id number]  Attending Physician Name and Address:  Jonetta Osgood, MD  Relative Name and Phone Number:       Current Level of Care: Hospital Recommended Level of Care: [place] Prior Approval Number:    Date Approved/Denied:   PASRR Number: [id number]  Discharge Plan: SNF    Current Diagnoses: Patient Active Problem List   Diagnosis Date Noted  . Severe diabetic hypoglycemia ([place]) 03/28/2020  . Acute hypokalemia 03/28/2020  . Hypokalemia   . Gastritis and gastroduodenitis   . Acute GI bleeding 02/17/2020  . Weakness generalized   . AVM (arteriovenous malformation) of small bowel, acquired with hemorrhage   . Malnutrition of moderate degree 01/16/2020  . Goals of care, counseling/discussion   . Palliative care by specialist   . Nausea & vomiting 01/13/2020  . Failure to thrive in adult 01/13/2020  . 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 Portal vein thrombosis   . SIRS (systemic inflammatory response syndrome) ([place]) 12/28/2019  . Worsening lower extremity weakness 12/14/2019  . Leg swelling 08/21/2019  . Hypotension due to hypovolemia 08/10/2019  . CKD (chronic kidney disease), stage III 08/10/2019  . Acute blood loss anemia 05/23/2018  . Dehydration 03/22/2018  . AKI (acute kidney injury) ([place]) 03/22/2018  . History of CVA (cerebrovascular accident) 03/22/2018  . Hip injury, left, subsequent encounter 11/08/2017  . Iron deficiency anemia 12/22/2016  . OSA (obstructive sleep apnea) 04/21/2016  . Osteopenia 04/07/2016  . Lung nodule, solitary 02/05/2016  . Aortic dilatation ([place])  02/05/2016  . Dyspnea 01/28/2016  . Cirrhosis ([employer]) 01/28/2016  . Allergic rhinitis 01/23/2016  . Hepatic cirrhosis ([place]) 10/28/2015  . Irritable bowel syndrome 08/12/2015  . Obesity (BMI 30-39.9) 04/30/2015  . Diabetic retinopathy of both eyes ([place]) 04/18/2015  . Former smoker 04/18/2015  . GAD (generalized anxiety disorder) 04/18/2015  . Status post primary angioplasty with coronary stent 04/18/2015  . Coronary artery disease involving native coronary artery 03/01/2015  . Chronic diastolic CHF (congestive heart failure) ([place]) 02/01/2015  . ST elevation myocardial infarction (STEMI) involving left circumflex coronary artery in recovery phase ([place]) 01/27/2015  . Idioventricular rhythm ([place])   . Diabetic peripheral neuropathy associated with type 2 diabetes mellitus ([place]) 01/23/2015  . Type 2 diabetes mellitus with diabetic polyneuropathy ([place]) 01/23/2015  . Thrombocytosis ([place]) 07/04/2014  . Cholelithiasis 02/07/2014  . Chronic diarrhea 01/29/2014  . Leukocytosis 06/02/2013  . [name] ([place]) 05/31/2013  . Essential hypertension 05/31/2013  . History of Bell's palsy 05/26/2013  . DERMATITIS, ATOPIC 11/16/2010  . DIZZINESS 11/16/2010  . DIASTOLIC DYSFUNCTION 86/76/1950  . Hyperlipidemia 12/15/2009  . Essential hypertension, benign 11/24/2009  . PSORIASIS 11/24/2009  . Depression 09/13/2009  . 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[place]) 07/08/2008    Orientation RESPIRATION BLADDER Height & Weight     Self, Time, Situation, Place  Normal Incontinent Weight: 189 lb 9.5 oz (86 kg) Height:  5' 4.5" (163.8 cm)  BEHAVIORAL SYMPTOMS/MOOD NEUROLOGICAL BOWEL NUTRITION STATUS      Continent Diet (see discharge summary)  AMBULATORY STATUS COMMUNICATION OF NEEDS Skin   Extensive Assist Verbally Other (Comment), Bruising (MASD on groin/breasts/buttocks; generalized  ecchymosis)                       Personal Care Assistance Level of Assistance  Bathing,  Feeding, Dressing Bathing Assistance: Maximum assistance Feeding assistance: Independent Dressing Assistance: Maximum assistance     Functional Limitations Info  Hearing, Sight, Speech Sight Info: Adequate Hearing Info: Adequate Speech Info: Adequate    SPECIAL CARE FACTORS FREQUENCY  PT (By licensed PT), OT (By licensed OT)     PT Frequency: 5x week OT Frequency: 5x week            Contractures Contractures Info: Not present    Additional Factors Info  Code Status, Allergies, Psychotropic Code Status Info: DNR Allergies Info: Doxycycline, Penicillins, Lisinopril, Tape, Latex Psychotropic Info: citalopram (CELEXA) tablet 10 mg daily PO         Current Medications (03/31/2020):  This is the current hospital active medication list Current Facility-Administered Medications  Medication Dose Route Frequency Provider Last Rate Last Admin  . acetaminophen (TYLENOL) tablet 325 mg  325 mg Oral Q6H PRN Eugenie Filler, MD       Or  . acetaminophen (TYLENOL) suppository 325 mg  325 mg Rectal Q6H PRN Eugenie Filler, MD      . amLODipine (NORVASC) tablet 5 mg  5 mg Oral Daily Eugenie Filler, MD   5 mg at 03/31/20 0841  . Chlorhexidine Gluconate Cloth 2 % PADS 6 each  6 each Topical Q0600 Eugenie Filler, MD   6 each at 03/31/20 [phone]  . citalopram (CELEXA) tablet 10 mg  10 mg Oral Daily Eugenie Filler, MD   10 mg at 03/31/20 0840  . clopidogrel (PLAVIX) tablet 75 mg  75 mg Oral Daily Eugenie Filler, MD   75 mg at 03/31/20 0846  . ezetimibe (ZETIA) tablet 10 mg  10 mg Oral Daily Eugenie Filler, MD   10 mg at 03/31/20 0840  . furosemide (LASIX) tablet 40 mg  40 mg Oral Daily Eugenie Filler, MD   40 mg at 03/31/20 0841  . insulin aspart (novoLOG) injection 0-6 Units  0-6 Units Subcutaneous Q4H Eugenie Filler, MD   1 Units at 03/31/20 1234  . latanoprost (XALATAN) 0.005 % ophthalmic solution 1 drop  1 drop Both Eyes QHS Eugenie Filler, MD   1 drop at  03/30/20 2143  . metoprolol tartrate (LOPRESSOR) injection 5 mg  5 mg Intravenous Q6H PRN Eugenie Filler, MD      . mirtazapine (REMERON) tablet 15 mg  15 mg Oral QHS Eugenie Filler, MD   15 mg at 03/30/20 2136  . mupirocin ointment (BACTROBAN) 2 % 1 application  1 application Nasal BID Eugenie Filler, MD      . ondansetron [facility]) tablet 4 mg  4 mg Oral Q6H PRN Eugenie Filler, MD       Or  . ondansetron [facility]) injection 4 mg  4 mg Intravenous Q6H PRN Eugenie Filler, MD      . pantoprazole (PROTONIX) EC tablet 40 mg  40 mg Oral BID AC Eugenie Filler, MD   40 mg at 03/31/20 0840  . rifaximin (XIFAXAN) tablet 550 mg  550 mg Oral BID Eugenie Filler, MD   550 mg at 03/31/20 0839  . ruxolitinib phosphate (JAKAFI) tablet 25 mg  25 mg Oral BID Eugenie Filler, MD   25 mg at 03/30/20 2137  . sodium chloride flush (  NS) 0.9 % injection 3 mL  3 mL Intravenous Q12H Eugenie Filler, MD   3 mL at 03/28/20 2112  . spironolactone (ALDACTONE) tablet 12.5 mg  12.5 mg Oral Daily Eugenie Filler, MD   12.5 mg at 03/31/20 0846  . timolol (TIMOPTIC) 0.5 % ophthalmic solution 1 drop  1 drop Both Eyes Daily Eugenie Filler, MD   1 drop at 03/31/20 0842  . triamcinolone cream (KENALOG) 0.1 % 1 application  1 application Topical BID PRN Eugenie Filler, MD         Discharge Medications: Please see discharge summary for a list of discharge medications.  Relevant Imaging Results:  Relevant Lab Results:   Additional Information SSN: [id number]  [place], [place]

## 2020-03-31 NOTE — Plan of Care (Signed)
Patient A&O x4. VSS. Free from falls. Pt turns self in bed. Ambulated to chair with PT, patient x2 assist with steady.  Pt tolerating fluids and meals.  Pt voiding adequately during shift. No c/o of pain. Bed wheels locked. Phone and call bell within reach. Pt is resting, no distress. POC reviewed.    Problem: Education: Goal: Knowledge of General Education information will improve Description: Including pain rating scale, medication(s)/side effects and non-pharmacologic comfort measures Outcome: Progressing   Problem: Health Behavior/Discharge Planning: Goal: Ability to manage health-related needs will improve Outcome: Progressing   Problem: Clinical Measurements: Goal: Ability to maintain clinical measurements within normal limits will improve Outcome: Progressing Goal: Will remain free from infection Outcome: Progressing Goal: Diagnostic test results will improve Outcome: Progressing Goal: Respiratory complications will improve Outcome: Progressing Goal: Cardiovascular complication will be avoided Outcome: Progressing   Problem: Activity: Goal: Risk for activity intolerance will decrease Outcome: Progressing   Problem: Nutrition: Goal: Adequate nutrition will be maintained Outcome: Progressing   Problem: Coping: Goal: Level of anxiety will decrease Outcome: Progressing   Problem: Elimination: Goal: Will not experience complications related to bowel motility Outcome: Progressing Goal: Will not experience complications related to urinary retention Outcome: Progressing   Problem: Pain Managment: Goal: General experience of comfort will improve Outcome: Progressing   Problem: Safety: Goal: Ability to remain free from injury will improve Outcome: Progressing   Problem: Skin Integrity: Goal: Risk for impaired skin integrity will decrease Outcome: Progressing

## 2020-03-31 NOTE — Progress Notes (Addendum)
We will ask                        PROGRESS NOTE        PATIENT DETAILS Name: Amanda [name] Age: 79 y.o. Sex: female Date of Birth: 1940/12/27 Admit Date: 03/28/2020 Admitting Physician Eugenie Filler, MD TSV:[id number], Tommi Rumps, NP  Brief Narrative: Patient is a 79 y.o. 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  See below for further details.  Significant events: 6/11>> admit to [facility] for evaluation of severe weakness-found to have severe hypoglycemia. 6/12>> no diarrhea-no BM since being in the hospital.  Significant studies: 6/11>> chest x-ray: No pneumonia/edema.  Antimicrobial therapy: None  Microbiology data: 6/11>> blood cultures: Negative  6/11>> urine culture:Klebsiella Ornithinolytica  Procedures : None  Consults: None  DVT Prophylaxis : SCDs-given history of upper GI bleeding  Subjective: No major issues overnight-lying comfortably in bed.  Has come to acceptance that she may need to go to SNF.  Assessment/Plan: Severe hypoglycemia: In the setting of no oral intake and ongoing insulin therapy on the day of admission.  Unfortunately did not follow-up with primary endocrinologist for the past few months.  Suspect was all ready having hypoglycemia at home-spouse decreased U5 100 dosing to daily dosing.  CBGs currently stable without long-acting insulin.  No longer on D10 infusion.    Insulin-dependent DM-2 (A1c 5.4 on 0/92) with complications (diabetic retinopathy, CAD/CVA): Initially all insulin products were held due to severe hypoglycemia-CBGs currently stable with just SSI.  Had spoken with Dr. Letta Median 6/12 over the phone-recommendations were for Lantus (15-25 units) and SSI on discharge.  However CBGs stable this morning with just SSI-we will continue to  follow-may not even need Lantus on discharge.  CBG (last 3)  Recent Labs    03/30/20 1558 03/30/20 2337 03/31/20 0403  GLUCAP 160* 132* 94   Leukocytosis: Downtrending-likely from stress margination-no obvious sources of infection apparent.  Although has urine culture positive-she has no symptoms of UTI-likely reflects asymptomatic bacteriuria and does not require treatment (apparently has history of C. difficile as well).  Blood cultures remain negative.  Chest x-ray without pneumonia.  Diarrhea has resolved-unlikely to have C. difficile.  Since WBC downtrending on its own-patient very stable-continue to monitor off antimicrobial therapy  Appears to have some amount of chronic leukocytosis at baseline.  Generalized weakness: Suspect secondary to hypoglycemia-but appears to have some amount of chronic debility at baseline-PT/OT eval appreciated-recommendations are for SNF.  Hypokalemia: Repleted.  Hypomagnesemia: Repleted  History of liver cirrhosis: Relatively well compensated-continue rifaximin-Lasix/spironolactone (may need to hold if orthostatic vital signs persists).  Minimally elevated ammonia levels but no signs of encephalopathy.  Patient stopped taking lactulose almost 3 weeks back because of diarrhea-we will follow closely.  CAD: No anginal symptoms-continue aspirin-Per H&P-Plavix discontinued in March 2021.  HTN: BP controlled-appears to have orthostatic hypotension-hold amlodipine for now.  Follow.  CKD stage IIIa: Creatinine close to baseline-follow.  History of portal vein thrombosis: Previously on anticoagulation-but discontinued due to GI bleeding.  Recent history of upper GI bleeding due to AVMs: Hemoglobin stable-no evidence of melena/hematochezia.  History of polycythemia vera/JAK2 mutation: Follows with Dr. Aundra Millet stable.  OSA-CPAP nightly  Obesity: Estimated body mass index is 32.04 kg/m as calculated from the following:   Height as of this  encounter: 5' 4.5" (1.638 m).  Weight as of this encounter: 86 kg.    Diet: Diet Order            Diet Heart Room service appropriate? Yes with Assist; Fluid consistency: Thin  Diet effective now                  Code Status:  DNR  Family Communication: Spouse at bedside on 6/13  Disposition Plan: Status is: Inpatient  Remains inpatient appropriate because:Inpatient level of care appropriate due to severity of illness  Dispo: The patient is from: Home              Anticipated d/c is to: SNF              Anticipated d/c date is: 2 days              Patient currently is medically stable to d/c.   Barriers to Discharge: Unsafe disposition-PT/OT recommending SNF-awaiting SNF bed  Antimicrobial agents: Anti-infectives (From admission, onward)   Start     Dose/Rate Route Frequency Ordered Stop   03/28/20 2200  rifaximin (XIFAXAN) tablet 550 mg     Discontinue     550 mg Oral 2 times daily 03/28/20 1837         Time spent: 15 minutes-Greater than 50% of this time was spent in counseling, explanation of diagnosis, planning of further management, and coordination of care.  MEDICATIONS: Scheduled Meds: . amLODipine  5 mg Oral Daily  . Chlorhexidine Gluconate Cloth  6 each Topical Q0600  . citalopram  10 mg Oral Daily  . clopidogrel  75 mg Oral Daily  . ezetimibe  10 mg Oral Daily  . furosemide  40 mg Oral Daily  . insulin aspart  0-6 Units Subcutaneous Q4H  . latanoprost  1 drop Both Eyes QHS  . mirtazapine  15 mg Oral QHS  . mupirocin ointment  1 application Nasal BID  . pantoprazole  40 mg Oral BID AC  . rifaximin  550 mg Oral BID  . ruxolitinib phosphate  25 mg Oral BID  . sodium chloride flush  3 mL Intravenous Q12H  . spironolactone  12.5 mg Oral Daily  . timolol  1 drop Both Eyes Daily   Continuous Infusions:  PRN Meds:.acetaminophen **OR** acetaminophen, metoprolol tartrate, ondansetron **OR** ondansetron (ZOFRAN) IV, triamcinolone cream   PHYSICAL  EXAM: Vital signs: Vitals:   03/31/20 0508 03/31/20 0608 03/31/20 0800 03/31/20 1200  BP: 121/64 132/76 (!) 122/91 134/86  Pulse: 75 68 93 72  Resp: 16 12 20 20   Temp: 98 F (36.7 C) 98 F (36.7 C) 97.8 F (36.6 C) (!) 97.5 F (36.4 C)  TempSrc: Axillary Axillary Oral Axillary  SpO2: 100% 99% 98% 96%  Weight:      Height:       Filed Weights   03/28/20 1527 03/28/20 2100 03/31/20 0400  Weight: 87.1 kg 85.7 kg 86 kg   Body mass index is 32.04 kg/m.   Gen Exam:Alert awake-not in any distress HEENT:atraumatic, normocephalic Chest: B/L clear to auscultation anteriorly CVS:S1S2 regular Abdomen:soft non tender, non distended Extremities:no edema Neurology: Non focal Skin: no rash  I have personally reviewed following labs and imaging studies  LABORATORY DATA: CBC: Recent Labs  Lab 03/28/20 1527 03/29/20 0016 03/30/20 0515  WBC 39.8* 17.6* 15.1*  NEUTROABS 33.5*  --   --   HGB 13.4 11.6* 10.6*  HCT 42.2 36.0 33.7*  MCV 87.7 87.4 89.6  PLT 487* 210 193  Basic Metabolic Panel: Recent Labs  Lab 03/28/20 1527 03/28/20 1934 03/29/20 0016 03/29/20 0804 03/30/20 0515  NA 136 135 130* 132* 134*  K 2.3* 2.7* 3.1* 3.6 4.0  CL 104 101 100 100 104  CO2 19* 23 20* 22 22  GLUCOSE 25* 49* 150* 104* 104*  BUN 18 18 17 16 14   CREATININE 1.56* 1.59* 1.41* 1.43* 1.39*  CALCIUM 8.4* 8.2* 7.6* 7.9* 8.1*  MG  --  1.3*  --  1.3* 2.3    GFR: Estimated Creatinine Clearance: 34.6 mL/min (A) (by C-G formula based on SCr of 1.39 mg/dL (H)).  Liver Function Tests: Recent Labs  Lab 03/28/20 1527 03/30/20 0515  AST 27 27  ALT 8 8  ALKPHOS 130* 108  BILITOT 2.5* 2.2*  PROT 6.2* 4.9*  ALBUMIN 2.9* 2.4*   No results for input(s): LIPASE, AMYLASE in the last 168 hours. Recent Labs  Lab 03/28/20 1527  AMMONIA 62*    Coagulation Profile: No results for input(s): INR, PROTIME in the last 168 hours.  Cardiac Enzymes: No results for input(s): CKTOTAL, CKMB,  CKMBINDEX, TROPONINI in the last 168 hours.  BNP (last 3 results) No results for input(s): PROBNP in the last 8760 hours.  Lipid Profile: No results for input(s): CHOL, HDL, LDLCALC, TRIG, CHOLHDL, LDLDIRECT in the last 72 hours.  Thyroid Function Tests: No results for input(s): TSH, T4TOTAL, FREET4, T3FREE, THYROIDAB in the last 72 hours.  Anemia Panel: No results for input(s): VITAMINB12, FOLATE, FERRITIN, TIBC, IRON, RETICCTPCT in the last 72 hours.  Urine analysis:    Component Value Date/Time   COLORURINE AMBER (A) 03/28/2020 2005   APPEARANCEUR TURBID (A) 03/28/2020 2005   LABSPEC 1.011 03/28/2020 2005   PHURINE 5.0 03/28/2020 2005   GLUCOSEU NEGATIVE 03/28/2020 2005   GLUCOSEU NEGATIVE 01/10/2018 1217   HGBUR MODERATE (A) 03/28/2020 2005   HGBUR large 05/07/2010 0845   BILIRUBINUR NEGATIVE 03/28/2020 2005   BILIRUBINUR neg 01/26/2016 1003   KETONESUR NEGATIVE 03/28/2020 2005   PROTEINUR 30 (A) 03/28/2020 2005   UROBILINOGEN 0.2 01/10/2018 1217   NITRITE NEGATIVE 03/28/2020 2005   LEUKOCYTESUR MODERATE (A) 03/28/2020 2005    Sepsis Labs: Lactic Acid, Venous    Component Value Date/Time   LATICACIDVEN 2.3 ([place]) 01/13/2020 0055    MICROBIOLOGY: Recent Results (from the past 240 hour(s))  SARS Coronavirus 2 by RT PCR (hospital order, performed in [place] hospital lab) Nasopharyngeal Nasopharyngeal Swab     Status: None   Collection Time: 03/28/20  5:12 PM   Specimen: Nasopharyngeal Swab  Result Value Ref Range Status   SARS Coronavirus 2 NEGATIVE NEGATIVE Final    Comment: (NOTE) SARS-CoV-2 target nucleic acids are NOT DETECTED.  The SARS-CoV-2 RNA is generally detectable in upper and lower respiratory specimens during the acute phase of infection. The lowest concentration of SARS-CoV-2 viral copies this assay can detect is 250 copies / mL. A negative result does not preclude SARS-CoV-2 infection and should not be used as the sole basis for treatment or  other patient management decisions.  A negative result may occur with improper specimen collection / handling, submission of specimen other than nasopharyngeal swab, presence of viral mutation(s) within the areas targeted by this assay, and inadequate number of viral copies (<250 copies / mL). A negative result must be combined with clinical observations, patient history, and epidemiological information.  Fact Sheet for Patients:   StrictlyIdeas.no  Fact Sheet for Healthcare Providers: BankingDealers.co.za  This test is not yet approved or  cleared by the [place] and has been authorized for detection and/or diagnosis of SARS-CoV-2 by FDA under an Emergency Use Authorization (EUA).  This EUA will remain in effect (meaning this test can be used) for the duration of the COVID-19 declaration under Section 564(b)(1) of the Act, 21 U.S.C. section 360bbb-3(b)(1), unless the authorization is terminated or revoked sooner.  Performed at [facility], [place] [address], [place], [place] [address]   Blood culture (routine x 2)     Status: None (Preliminary result)   Collection Time: 03/28/20  7:34 PM   Specimen: BLOOD  Result Value Ref Range Status   Specimen Description BLOOD RIGHT ANTECUBITAL  Final   Special Requests   Final    BOTTLES DRAWN AEROBIC ONLY Blood Culture adequate volume   Culture   Final    NO GROWTH 3 DAYS Performed at [facility], [place] [address], [place], [place] [address]    Report Status PENDING  Incomplete  Blood culture (routine x 2)     Status: None (Preliminary result)   Collection Time: 03/28/20  7:41 PM   Specimen: BLOOD RIGHT ARM  Result Value Ref Range Status   Specimen Description BLOOD RIGHT ARM  Final   Special Requests   Final    BOTTLES DRAWN AEROBIC AND ANAEROBIC Blood Culture adequate volume   Culture   Final    NO GROWTH 3 DAYS Performed at [facility], [place] [address]  [place], [place] [address]    Report Status PENDING  Incomplete  Urine Culture     Status: Abnormal   Collection Time: 03/28/20  8:19 PM   Specimen: Urine, Catheterized  Result Value Ref Range Status   Specimen Description URINE, CATHETERIZED  Final   Special Requests   Final    NONE Performed at [facility], [place] [address] [place], [place] [address]    Culture >=100,000 COLONIES/mL KLEBSIELLA ORNITHINOLYTICA (A)  Final   Report Status 03/30/2020 FINAL  Final   Organism ID, Bacteria KLEBSIELLA ORNITHINOLYTICA (A)  Final      Susceptibility   Klebsiella ornithinolytica - MIC*    AMPICILLIN RESISTANT Resistant     CEFAZOLIN <=4 SENSITIVE Sensitive     CEFTRIAXONE <=1 SENSITIVE Sensitive     CIPROFLOXACIN <=0.25 SENSITIVE Sensitive     GENTAMICIN <=1 SENSITIVE Sensitive     IMIPENEM <=0.25 SENSITIVE Sensitive     NITROFURANTOIN 64 INTERMEDIATE Intermediate     TRIMETH/SULFA <=20 SENSITIVE Sensitive     AMPICILLIN/SULBACTAM 4 SENSITIVE Sensitive     PIP/TAZO <=4 SENSITIVE Sensitive     * >=100,000 COLONIES/mL KLEBSIELLA ORNITHINOLYTICA  MRSA PCR Screening     Status: Abnormal   Collection Time: 03/28/20 11:03 PM   Specimen: Nasal Mucosa; Nasopharyngeal  Result Value Ref Range Status   MRSA by PCR POSITIVE (A) NEGATIVE Final    Comment:        The GeneXpert MRSA Assay (FDA approved for NASAL specimens only), is one component of a comprehensive MRSA colonization surveillance program. It is not intended to diagnose MRSA infection nor to guide or monitor treatment for MRSA infections. RESULT CALLED TO, READ BACK BY AND VERIFIED WITH: Q LOFTON TN 03/29/20 0034 JDW Performed at [facility], [place] [address] [place], [place] [address]     RADIOLOGY STUDIES/RESULTS: No results found.   LOS: 3 days   Oren Binet, MD  [facility]    To contact the attending provider between  7A-7P or the covering provider during after hours 7P-7A, please log into the web  site [email or web address] and access using universal [place] password for that web site. If you do not have the password, please call the hospital operator.  03/31/2020, 3:14 PM

## 2020-03-31 NOTE — Progress Notes (Signed)
Physical Therapy Treatment Patient Details Name: Amanda [name] MRN: [id number] DOB: 02/01/1941 Today's Date: 03/31/2020    History of Present Illness Amanda [name] is a 79 y.o. female with medical history significant of type 2 diabetes mellitus diagnosed in 2008 insulin-dependent since diagnosis which is poorly controlled with complications of diabetic retinopathy, chronic kidney disease, peripheral neuropathy, history of coronary artery disease status post stents, history of CVA(03/2018), prior history of C. difficile colitis, depression, cirrhosis, hyperlipidemia, polycythemia, myeloproliferative disorder who presents to the ED from endocrinologist office with weakness due to hypoglycemia.    PT Comments    Continuing work on functional mobility and activity tolerance;  Able to make modest progress with mobility with +2 assistance to help allay Amanda [name]'s substantial fear of falling; Able to stand and take a few steps to recliner; Pervasive Posterior lean with all standing activity, requiring mod/max assist to prevent fall backwards; Tolerated standing for approx 15 seconds before she becomes anxious and must sit down; performed 2 bouts of standing in an effort ot capture a standing BP -- which showed a precipitous drop of SBP between sitting and standing:     03/31/20 1115  Vital Signs  Patient Position (if appropriate) Orthostatic Vitals  Orthostatic Lying   BP- Lying (!) 122/91  Orthostatic Sitting  BP- Sitting 127/62  Orthostatic Standing at 0 minutes  BP- Standing at 0 minutes (!) 79/68  Pulse- Standing at 0 minutes 76        Follow Up Recommendations  SNF;Other (comment) (Seems more open to SNF today)     Equipment Recommendations  None recommended by PT    Recommendations for Other Services       Precautions / Restrictions Precautions Precautions: Fall Precaution Comments: watch BP    Mobility  Bed Mobility Overal bed mobility: Needs Assistance Bed Mobility:  Supine to Sit     Supine to sit: Mod assist     General bed mobility comments: assist to scoot hips to EOB and elevate trunk. assist for LEs onto bed and to boost to [facility]  Transfers Overall transfer level: Needs assistance Equipment used: Rolling walker (2 wheeled) Transfers: Sit to/from Stand Sit to Stand: Mod assist;+2 physical assistance;+2 safety/equipment         General transfer comment: Very fearful of falling, and tends to heavily lean posteriorly; heavy mod assist to suport her in the posterior lean and prevent fall  Ambulation/Gait Ambulation/Gait assistance: Mod assist;+2 safety/equipment;+2 physical assistance Gait Distance (Feet): 2 Feet (pivot steps bed to chair) Assistive device: Rolling walker (2 wheeled) Gait Pattern/deviations: Shuffle;Leaning posteriorly     General Gait Details: REquires at least mod assist of 2, otherwise she would fall backwards; very anxious to be on her feet   Stairs             Wheelchair Mobility    Modified Rankin (Stroke Patients Only)       Balance     Sitting balance-Leahy Scale: Fair       Standing balance-Leahy Scale: Zero Standing balance comment: rEquired mod to max physical assist to prevent a fall backwards                            Cognition Arousal/Alertness: Awake/alert Behavior During Therapy: Anxious Overall Cognitive Status: Within Functional Limits for tasks assessed (for simple mobility)  General Comments: Extremely fearful of falling to the point of distraction      Exercises      General Comments General comments (skin integrity, edema, etc.):       Pertinent Vitals/Pain Pain Assessment: No/denies pain    Home Living                      Prior Function            PT Goals (current goals can now be found in the care plan section) Acute Rehab PT Goals Patient Stated Goal: return home PT Goal Formulation: With  patient Time For Goal Achievement: 04/12/20 Potential to Achieve Goals: Fair Progress towards PT goals: Progressing toward goals (slowly)    Frequency    Min 3X/week (May adjust freq to min 2x/wk when dc to SNF is conformed)      PT Plan Discharge plan needs to be updated    Co-evaluation              AM-PAC PT "6 Clicks" Mobility   Outcome Measure  Help needed turning from your back to your side while in a flat bed without using bedrails?: A Lot Help needed moving from lying on your back to sitting on the side of a flat bed without using bedrails?: A Lot Help needed moving to and from a bed to a chair (including a wheelchair)?: A Lot Help needed standing up from a chair using your arms (e.g., wheelchair or bedside chair)?: A Lot Help needed to walk in hospital room?: A Lot Help needed climbing 3-5 steps with a railing? : Total 6 Click Score: 11    End of Session Equipment Utilized During Treatment: Gait belt Activity Tolerance: Other (comment) (Limited standing tolerance) Patient left: in chair;with call bell/phone within reach;with chair alarm set Nurse Communication: Mobility status;Other (comment) (BP drop in standing) PT Visit Diagnosis: Dizziness and giddiness (R42);Muscle weakness (generalized) (M62.81);Unsteadiness on feet (R26.81);Other abnormalities of gait and mobility (R26.89)     Time: 1100-1146 PT Time Calculation (min) (ACUTE ONLY): 46 min  Charges:  $Gait Training: 8-22 mins $Therapeutic Activity: 23-37 mins                     Amanda [name], PT  Acute Rehabilitation Services Pager [phone] Office [place] 03/31/2020, 2:13 PM

## 2020-03-31 NOTE — TOC Initial Note (Signed)
Transition of Care [facility]) - Initial/Assessment Note    Patient Details  Name: Amanda [name] MRN: [id number] Date of Birth: 07/24/1941  [facility] of [facility]) CM/SW Contact:    Amanda Mt, LCSW Phone Number: 03/31/2020, 10:21 AM  Clinical Narrative:                 CSW familiar w/ pt from a previous admission. Pt from home w/ husband Amanda [name]. CSW called Amanda [name] at [phone]. Introduced self, role, reason for call. Pt recommended for SNF, she has been previously to SNF and pt husband has already spoken with [facility]. Pt husband aware that pt likely in copay days. Pt husband believes that since they have met their out of pocket for the year that they shouldn't need a 60 day wellness period. CSW defers that determination to [facility] (CSW will attempt to call and verify anything I can). CSW also spoke with Amanda [name] at [facility]- she has been in touch with Mr. Amanda [name] and states that the business office will be in touch with him also moving forward.   Expected Discharge Plan: Skilled Nursing Facility Barriers to Discharge: Continued Medical Work up, Ship broker   Patient Goals and CMS Choice   CMS Medicare.gov Compare Post Acute Care list provided to:: Patient Represenative (must comment) (pt spouse) Choice offered to / list presented to : Spouse  Expected Discharge Plan and Services Expected Discharge Plan: Skilled Nursing Facility In-house Referral: Clinical Social Work Discharge Planning Services: CM Consult Post Acute Care Choice: Amanda [name] Living arrangements for the past 2 months: [place], [place]    Prior Living Arrangements/Services Living arrangements for the past 2 months: [place], [place] Lives with:: Spouse Patient language and need for interpreter reviewed:: Yes (no needs) Do you feel safe going back to the place where you live?: Yes      Need for Family Participation in Patient  Care: Yes (Comment) (assistance w/ daily cares) Care giver support system in place?: Yes (comment) (pt spouse) Current home services: DME Criminal Activity/Legal Involvement Pertinent to Current Situation/Hospitalization: No - Comment as needed  Activities of Daily Living Home Assistive Devices/Equipment: Wheelchair, Environmental consultant (specify type), Eyeglasses, Grab bars in shower ADL Screening (condition at time of admission) Patient's cognitive ability adequate to safely complete daily activities?: Yes Is the patient deaf or have difficulty hearing?: No Does the patient have difficulty seeing, even when wearing glasses/contacts?: No Does the patient have difficulty concentrating, remembering, or making decisions?: No Patient able to express need for assistance with ADLs?: Yes Does the patient have difficulty dressing or bathing?: Yes Independently performs ADLs?: No Communication: Independent Dressing (OT): Needs assistance Is this a change from baseline?: Pre-admission baseline Grooming: Needs assistance Is this a change from baseline?: Pre-admission baseline Feeding: Independent Bathing: Needs assistance Is this a change from baseline?: Pre-admission baseline Toileting: Needs assistance Is this a change from baseline?: Pre-admission baseline In/Out Bed: Needs assistance Is this a change from baseline?: Pre-admission baseline Walks in Home: Needs assistance Is this a change from baseline?: Pre-admission baseline Does the patient have difficulty walking or climbing stairs?: Yes Weakness of Legs: Both Weakness of Arms/Hands: None  Permission Sought/Granted Permission sought to share information with : Family Supports, Chartered certified accountant granted to share information with : Yes, Verbal Permission Granted  Share Information with NAME: Amanda [name]  Permission granted to share info w AGENCY: Amanda [name] granted to share info w Relationship:  spouse  Permission granted to share info w Contact Information: [phone]  Emotional Assessment Appearance:: Other (Comment Required (telephonic assessment w/ pt husband) Attitude/Demeanor/Rapport: Other (comment) (telephonic assessment w/ pt husband) Affect (typically observed): Other (comment) (telephonic assessment w/ pt husband) Orientation: : Oriented to  Time, Oriented to Situation, Oriented to Place, Oriented to Self Alcohol / Substance Use: Not Applicable Psych Involvement: No (comment)  Admission diagnosis:  Hypokalemia [E87.6] Hypoglycemia [E16.2] Severe diabetic hypoglycemia ([place]) [E11.649] AKI (acute kidney injury) ([place]) [N17.9] Patient Active Problem List   Diagnosis Date Noted  . Severe diabetic hypoglycemia ([place]) 03/28/2020  . Acute hypokalemia 03/28/2020  . Hypokalemia   . Gastritis and gastroduodenitis   . Acute GI bleeding 02/17/2020  . Weakness generalized   . AVM (arteriovenous malformation) of small bowel, acquired with hemorrhage   . Malnutrition of moderate degree 01/16/2020  . Goals of care, counseling/discussion   . Palliative care by specialist   . Nausea & vomiting 01/13/2020  . Failure to thrive in adult 01/13/2020  . Myeloproliferative disorder ([place]) 01/13/2020  . Portal vein thrombosis   . SIRS (systemic inflammatory response syndrome) ([place]) 12/28/2019  . Worsening lower extremity weakness 12/14/2019  . Leg swelling 08/21/2019  . Hypotension due to hypovolemia 08/10/2019  . CKD (chronic kidney disease), stage III 08/10/2019  . Acute blood loss anemia 05/23/2018  . Dehydration 03/22/2018  . AKI (acute kidney injury) ([place]) 03/22/2018  . History of CVA (cerebrovascular accident) 03/22/2018  . Hip injury, left, subsequent encounter 11/08/2017  . Iron deficiency anemia 12/22/2016  . OSA (obstructive sleep apnea) 04/21/2016  . Osteopenia 04/07/2016  . Lung nodule, solitary 02/05/2016  . Aortic dilatation ([place]) 02/05/2016  . Dyspnea 01/28/2016   . Cirrhosis ([place]) 01/28/2016  . Allergic rhinitis 01/23/2016  . Hepatic cirrhosis ([place]) 10/28/2015  . Irritable bowel syndrome 08/12/2015  . Obesity (BMI 30-39.9) 04/30/2015  . Diabetic retinopathy of both eyes ([place]) 04/18/2015  . Former smoker 04/18/2015  . GAD (generalized anxiety disorder) 04/18/2015  . Status post primary angioplasty with coronary stent 04/18/2015  . Coronary artery disease involving native coronary artery 03/01/2015  . Chronic diastolic CHF (congestive heart failure) ([place]) 02/01/2015  . ST elevation myocardial infarction (STEMI) involving left circumflex coronary artery in recovery phase ([place]) 01/27/2015  . Idioventricular rhythm ([place])   . Diabetic peripheral neuropathy associated with type 2 diabetes mellitus ([place]) 01/23/2015  . Type 2 diabetes mellitus with diabetic polyneuropathy ([place]) 01/23/2015  . Thrombocytosis ([place]) 07/04/2014  . Cholelithiasis 02/07/2014  . Chronic diarrhea 01/29/2014  . Leukocytosis 06/02/2013  . [name] ([place]) 05/31/2013  . Essential hypertension 05/31/2013  . History of Bell's palsy 05/26/2013  . DERMATITIS, ATOPIC 11/16/2010  . DIZZINESS 11/16/2010  . DIASTOLIC DYSFUNCTION 16/94/5038  . Hyperlipidemia 12/15/2009  . Essential hypertension, benign 11/24/2009  . PSORIASIS 11/24/2009  . Depression 09/13/2009  . 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[place]) 07/08/2008   PCP:  [name], NP Pharmacy:   [facility] [place], [place] - [address] [place] [address] Phone: [phone] Fax: [phone]  Readmission Risk Interventions Readmission Risk Prevention Plan 03/31/2020 01/17/2020  Transportation Screening Complete Complete  PCP or Specialist Appt within 3-5 Days - Not Complete  Not Complete comments - plan for SNF  [place]RI or [place] - Complete  Social Work Consult for Recovery Care Planning/Counseling - Complete   Palliative Care Screening - Complete  Medication Review (RN Care  Manager) Referral to Pharmacy Referral to Pharmacy  PCP or Specialist appointment within 3-5 days of discharge Complete -  [place] or Home Care Consult Complete -  SW Recovery Care/Counseling Consult Complete -  Palliative Care Screening Not Applicable -  [place] Complete -  Some recent data might be hidden

## 2020-03-31 NOTE — Progress Notes (Signed)
Inpatient Diabetes Program Recommendations  AACE/ADA: New Consensus Statement on Inpatient Glycemic Control (2015)  Target Ranges:  Prepandial:   less than 140 mg/dL      Peak postprandial:   less than 180 mg/dL (1-2 hours)      Critically ill patients:  140 - 180 mg/dL   Lab Results  Component Value Date   GLUCAP 94 03/31/2020   HGBA1C 5.4 03/29/2020    Review of Glycemic Control Results for Amanda [name], Amanda [name] (MRN [id number]) as of 03/31/2020 09:08  Ref. Range 03/30/2020 04:03 03/30/2020 08:08 03/30/2020 15:58 03/30/2020 23:37 03/31/2020 04:03  Glucose-Capillary Latest Ref Range: 70 - 99 mg/dL 107 (H) 98 160 (H) 132 (H) 94   Diabetes history: DM 2, Sees Dr. Cruzita Lederer, Endocrinology (last visit 6/11) Outpatient Diabetes medications: Humulin R U-500 90 units Daily, Trulicity 1.5 mg Weekly on Fridays Current orders for Inpatient glycemic control:  Novolog 0-6 units Q4 hours  Inpatient Diabetes Program Recommendations:    Agree with current regimen.  Glucose trends surprisingly still good on only "very sensitive" Novolog Correction. Dr. Cruzita Lederer talked with Dr. Sloan Leiter over the weekend regarding home regimen. Pt to call office 2 days after d/c to report glucose trends and go from there.  Thanks,  Tama Headings RN, MSN, BC-ADM Inpatient Diabetes Coordinator Team Pager [phone] (8a-5p)

## 2020-04-01 LAB — GLUCOSE, CAPILLARY
Glucose-Capillary: 102 mg/dL — ABNORMAL HIGH (ref 70–99)
Glucose-Capillary: 108 mg/dL — ABNORMAL HIGH (ref 70–99)
Glucose-Capillary: 119 mg/dL — ABNORMAL HIGH (ref 70–99)
Glucose-Capillary: 88 mg/dL (ref 70–99)
Glucose-Capillary: 95 mg/dL (ref 70–99)

## 2020-04-01 MED FILL — JAKAFI 25 MG TABLET: 25 | 30 days supply | Qty: 60 | Fill #6

## 2020-04-01 NOTE — Plan of Care (Signed)
Patient A&O x4. VSS. Free from falls. Pt turns self in bed. Ambulated to chair,  patient x2 assist with steady.  Pt tolerating fluids and meals.  Pt voiding adequately during shift. No c/o of pain. Bed wheels locked. Phone and call bell within reach. Pt is resting, no distress. POC reviewed with patient and spouse.   Problem: Education: Goal: Knowledge of General Education information will improve Description: Including pain rating scale, medication(s)/side effects and non-pharmacologic comfort measures Outcome: Progressing   Problem: Health Behavior/Discharge Planning: Goal: Ability to manage health-related needs will improve Outcome: Progressing   Problem: Clinical Measurements: Goal: Ability to maintain clinical measurements within normal limits will improve Outcome: Progressing Goal: Will remain free from infection Outcome: Progressing Goal: Diagnostic test results will improve Outcome: Progressing Goal: Respiratory complications will improve Outcome: Progressing Goal: Cardiovascular complication will be avoided Outcome: Progressing   Problem: Activity: Goal: Risk for activity intolerance will decrease Outcome: Progressing   Problem: Nutrition: Goal: Adequate nutrition will be maintained Outcome: Progressing   Problem: Coping: Goal: Level of anxiety will decrease Outcome: Progressing   Problem: Elimination: Goal: Will not experience complications related to bowel motility Outcome: Progressing Goal: Will not experience complications related to urinary retention Outcome: Progressing   Problem: Pain Managment: Goal: General experience of comfort will improve Outcome: Progressing   Problem: Safety: Goal: Ability to remain free from injury will improve Outcome: Progressing   Problem: Skin Integrity: Goal: Risk for impaired skin integrity will decrease Outcome: Progressing

## 2020-04-01 NOTE — Progress Notes (Signed)
   04/01/20 1027  Vitals  BP (!) 126/57  MAP (mmHg) 77  BP Method Automatic  Patient Position (if appropriate) Orthostatic Vitals (lying)  Pulse Rate 81  ECG Heart Rate 81  Resp 16  Orthostatic Lying   BP- Lying 126/57  Pulse- Lying 81  Orthostatic Sitting  BP- Sitting 129/82  Pulse- Sitting 92  Orthostatic Standing at 0 minutes  BP- Standing at 0 minutes 124/71  Pulse- Standing at 0 minutes 92  Orthostatic Standing at 3 minutes  BP- Standing at 3 minutes 130/67  Pulse- Standing at 3 minutes 102  MEWS Score  MEWS Temp 0  MEWS Systolic 0  MEWS Pulse 0  MEWS RR 0  MEWS LOC 0  MEWS Score 0  MEWS Score Color Green

## 2020-04-01 NOTE — Plan of Care (Signed)
  Problem: Clinical Measurements: Goal: Diagnostic test results will improve Outcome: Progressing Goal: Respiratory complications will improve Outcome: Progressing Goal: Cardiovascular complication will be avoided Outcome: Progressing   Problem: Activity: Goal: Risk for activity intolerance will decrease Outcome: Progressing   Problem: Coping: Goal: Level of anxiety will decrease Outcome: Progressing

## 2020-04-01 NOTE — Progress Notes (Signed)
We will ask                        PROGRESS NOTE        PATIENT DETAILS Name: Amanda [name] Age: 79 y.o. Sex: female Date of Birth: 1941/03/05 Admit Date: 03/28/2020 Admitting Physician Eugenie Filler, MD VOZ:DGUYQIHK, Tommi Rumps, NP  Brief Narrative: Patient is a 79 y.o. 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  See below for further details.  Significant events: 6/11>> admit to [facility] for evaluation of severe weakness-found to have severe hypoglycemia. 6/12>> no diarrhea-no BM since being in the hospital.  Significant studies: 6/11>> chest x-ray: No pneumonia/edema.  Antimicrobial therapy: None  Microbiology data: 6/11>> blood cultures: Negative  6/11>> urine culture:Klebsiella Ornithinolytica  Procedures : None  Consults: None  DVT Prophylaxis : SCDs-given history of upper GI bleeding  Subjective: No major issues overnight-lying comfortably in bed.  Has come to acceptance that she may need to go to SNF.  Assessment/Plan: Severe hypoglycemia: In the setting of no oral intake and ongoing insulin therapy on the day of admission.  Unfortunately did not follow-up with primary endocrinologist for the past few months.  Suspect was all ready having hypoglycemia at home-as spouse decreased U 500 dosing to daily dosing.  CBGs currently stable without long-acting insulin.  No longer on D10 infusion.    Insulin-dependent DM-2 (A1c 5.4 on 7/42) with complications (diabetic retinopathy, CAD/CVA): Initially all insulin products were held due to severe hypoglycemia-CBGs currently stable with just SSI.  Had spoken with Dr. Letta Median 6/12 over the phone-recommendations were for Lantus (15-25 units) and SSI on discharge.  However CBGs are stable with SSI-patient likely will not require  long-acting insulin at this point.   Recent Labs    04/01/20 0412 04/01/20 0820 04/01/20 1220  GLUCAP 88 95 108*   Leukocytosis: Downtrending-likely from stress margination-no obvious sources of infection apparent.  Although has urine culture positive-she has no symptoms of UTI-likely reflects asymptomatic bacteriuria and does not require treatment (apparently has history of C. difficile as well).  Blood cultures remain negative.  Chest x-ray without pneumonia.  Diarrhea has resolved-unlikely to have C. difficile.  Since WBC downtrending on its own-patient very stable-continue to monitor off antimicrobial therapy  Appears to have some amount of chronic leukocytosis at baseline.  Generalized weakness: Suspect secondary to hypoglycemia-but appears to have some amount of chronic debility at baseline-PT/OT eval appreciated-recommendations are for SNF.  Hypokalemia: Repleted.  Hypomagnesemia: Repleted  History of liver cirrhosis: Relatively well compensated-continue rifaximin-Lasix/spironolactone. Minimally elevated ammonia levels but no signs of encephalopathy.  Patient stopped taking lactulose almost 3 weeks back because of diarrhea-we will follow closely.  CAD: No anginal symptoms-continue aspirin-Per H&P-Plavix discontinued in March 2021.  HTN: BP controlled-had orthostatic hypotension on 6/14-amlodipine discontinued-orthostatic vital signs negative on 6/15.  Continue Lasix/Aldactone cautiously-follow.  CKD stage IIIa: Creatinine close to baseline-follow.  History of portal vein thrombosis: Previously on anticoagulation-but discontinued due to GI bleeding.  Recent history of upper GI bleeding due to AVMs: Hemoglobin stable-no evidence of melena/hematochezia.  History of polycythemia vera/JAK2 mutation: Follows with Dr. Aundra Millet stable.  OSA-CPAP nightly  Obesity: Estimated body mass index is 32.04 kg/m as calculated from the following:   Height as of this encounter:  5' 4.5" (1.638 m).   Weight as of this encounter: 86 kg.  Diet: Diet Order            Diet Heart Room service appropriate? Yes with Assist; Fluid consistency: Thin  Diet effective now                  Code Status:  DNR  Family Communication: Spouse at bedside on 6/13  Disposition Plan: Status is: Inpatient  Remains inpatient appropriate because:Inpatient level of care appropriate due to severity of illness  Dispo: The patient is from: Home              Anticipated d/c is to: SNF              Anticipated d/c date is: 2 days              Patient currently is medically stable to d/c.   Barriers to Discharge: Unsafe disposition (see detailed case management note on 6/14)-PT/OT recommending SNF-awaiting SNF bed   Antimicrobial agents: Anti-infectives (From admission, onward)   Start     Dose/Rate Route Frequency Ordered Stop   03/28/20 2200  rifaximin (XIFAXAN) tablet 550 mg     Discontinue     550 mg Oral 2 times daily 03/28/20 1837         Time spent: 15 minutes-Greater than 50% of this time was spent in counseling, explanation of diagnosis, planning of further management, and coordination of care.  MEDICATIONS: Scheduled Meds: . Chlorhexidine Gluconate Cloth  6 each Topical Q0600  . citalopram  10 mg Oral Daily  . clopidogrel  75 mg Oral Daily  . ezetimibe  10 mg Oral Daily  . [START ON 04/02/2020] furosemide  20 mg Oral Daily  . insulin aspart  0-6 Units Subcutaneous Q4H  . latanoprost  1 drop Both Eyes QHS  . mirtazapine  15 mg Oral QHS  . mupirocin ointment  1 application Nasal BID  . pantoprazole  40 mg Oral BID AC  . rifaximin  550 mg Oral BID  . ruxolitinib phosphate  25 mg Oral BID  . sodium chloride flush  3 mL Intravenous Q12H  . [START ON 04/02/2020] spironolactone  12.5 mg Oral Daily  . timolol  1 drop Both Eyes Daily   Continuous Infusions:  PRN Meds:.acetaminophen **OR** acetaminophen, metoprolol tartrate, ondansetron **OR** ondansetron  (ZOFRAN) IV, triamcinolone cream   PHYSICAL EXAM: Vital signs: Vitals:   04/01/20 1027 04/01/20 1031 04/01/20 1034 04/01/20 1036  BP: (!) 126/57 129/82 124/71 130/67  Pulse: 81 96    Resp: 16 19    Temp:      TempSrc:      SpO2:      Weight:      Height:       Filed Weights   03/28/20 1527 03/28/20 2100 03/31/20 0400  Weight: 87.1 kg 85.7 kg 86 kg   Body mass index is 32.04 kg/m.   Gen Exam:Alert awake-not in any distress HEENT:atraumatic, normocephalic Chest: B/L clear to auscultation anteriorly CVS:S1S2 regular Abdomen:soft non tender, non distended Extremities:no edema Neurology: Non focal Skin: no rash  I have personally reviewed following labs and imaging studies  LABORATORY DATA: CBC: Recent Labs  Lab 03/28/20 1527 03/29/20 0016 03/30/20 0515  WBC 39.8* 17.6* 15.1*  NEUTROABS 33.5*  --   --   HGB 13.4 11.6* 10.6*  HCT 42.2 36.0 33.7*  MCV 87.7 87.4 89.6  PLT 487* 210 476    Basic Metabolic Panel: Recent Labs  Lab 03/28/20 1527 03/28/20 1934 03/29/20 0016 03/29/20 0804 03/30/20 0515  NA 136 135 130* 132* 134*  K 2.3* 2.7* 3.1* 3.6 4.0  CL 104 101 100 100 104  CO2 19* 23 20* 22 22  GLUCOSE 25* 49* 150* 104* 104*  BUN 18 18 17 16 14   CREATININE 1.56* 1.59* 1.41* 1.43* 1.39*  CALCIUM 8.4* 8.2* 7.6* 7.9* 8.1*  MG  --  1.3*  --  1.3* 2.3    GFR: Estimated Creatinine Clearance: 34.6 mL/min (A) (by C-G formula based on SCr of 1.39 mg/dL (H)).  Liver Function Tests: Recent Labs  Lab 03/28/20 1527 03/30/20 0515  AST 27 27  ALT 8 8  ALKPHOS 130* 108  BILITOT 2.5* 2.2*  PROT 6.2* 4.9*  ALBUMIN 2.9* 2.4*   No results for input(s): LIPASE, AMYLASE in the last 168 hours. Recent Labs  Lab 03/28/20 1527  AMMONIA 62*    Coagulation Profile: No results for input(s): INR, PROTIME in the last 168 hours.  Cardiac Enzymes: No results for input(s): CKTOTAL, CKMB, CKMBINDEX, TROPONINI in the last 168 hours.  BNP (last 3 results) No results  for input(s): PROBNP in the last 8760 hours.  Lipid Profile: No results for input(s): CHOL, HDL, LDLCALC, TRIG, CHOLHDL, LDLDIRECT in the last 72 hours.  Thyroid Function Tests: No results for input(s): TSH, T4TOTAL, FREET4, T3FREE, THYROIDAB in the last 72 hours.  Anemia Panel: No results for input(s): VITAMINB12, FOLATE, FERRITIN, TIBC, IRON, RETICCTPCT in the last 72 hours.  Urine analysis:    Component Value Date/Time   COLORURINE AMBER (A) 03/28/2020 2005   APPEARANCEUR TURBID (A) 03/28/2020 2005   LABSPEC 1.011 03/28/2020 2005   PHURINE 5.0 03/28/2020 2005   GLUCOSEU NEGATIVE 03/28/2020 2005   GLUCOSEU NEGATIVE 01/10/2018 1217   HGBUR MODERATE (A) 03/28/2020 2005   HGBUR large 05/07/2010 0845   BILIRUBINUR NEGATIVE 03/28/2020 2005   BILIRUBINUR neg 01/26/2016 1003   KETONESUR NEGATIVE 03/28/2020 2005   PROTEINUR 30 (A) 03/28/2020 2005   UROBILINOGEN 0.2 01/10/2018 1217   NITRITE NEGATIVE 03/28/2020 2005   LEUKOCYTESUR MODERATE (A) 03/28/2020 2005    Sepsis Labs: Lactic Acid, Venous    Component Value Date/Time   LATICACIDVEN 2.3 ([place]) 01/13/2020 0055    MICROBIOLOGY: Recent Results (from the past 240 hour(s))  SARS Coronavirus 2 by RT PCR (hospital order, performed in [place] hospital lab) Nasopharyngeal Nasopharyngeal Swab     Status: None   Collection Time: 03/28/20  5:12 PM   Specimen: Nasopharyngeal Swab  Result Value Ref Range Status   SARS Coronavirus 2 NEGATIVE NEGATIVE Final    Comment: (NOTE) SARS-CoV-2 target nucleic acids are NOT DETECTED.  The SARS-CoV-2 RNA is generally detectable in upper and lower respiratory specimens during the acute phase of infection. The lowest concentration of SARS-CoV-2 viral copies this assay can detect is 250 copies / mL. A negative result does not preclude SARS-CoV-2 infection and should not be used as the sole basis for treatment or other patient management decisions.  A negative result may occur with improper  specimen collection / handling, submission of specimen other than nasopharyngeal swab, presence of viral mutation(s) within the areas targeted by this assay, and inadequate number of viral copies (<250 copies / mL). A negative result must be combined with clinical observations, patient history, and epidemiological information.  Fact Sheet for Patients:   StrictlyIdeas.no  Fact Sheet for Healthcare Providers: BankingDealers.co.za  This test is not yet approved or  cleared by the [place] FDA and has been authorized for detection and/or diagnosis of SARS-CoV-2 by  FDA under an Emergency Use Authorization (EUA).  This EUA will remain in effect (meaning this test can be used) for the duration of the COVID-19 declaration under Section 564(b)(1) of the Act, 21 U.S.C. section 360bbb-3(b)(1), unless the authorization is terminated or revoked sooner.  Performed at [facility], [place] [address] [place], [place] [address]   Blood culture (routine x 2)     Status: None (Preliminary result)   Collection Time: 03/28/20  7:34 PM   Specimen: BLOOD  Result Value Ref Range Status   Specimen Description BLOOD RIGHT ANTECUBITAL  Final   Special Requests   Final    BOTTLES DRAWN AEROBIC ONLY Blood Culture adequate volume   Culture   Final    NO GROWTH 4 DAYS Performed at [facility], [place] [address] [place], [place] [address]    Report Status PENDING  Incomplete  Blood culture (routine x 2)     Status: None (Preliminary result)   Collection Time: 03/28/20  7:41 PM   Specimen: BLOOD RIGHT ARM  Result Value Ref Range Status   Specimen Description BLOOD RIGHT ARM  Final   Special Requests   Final    BOTTLES DRAWN AEROBIC AND ANAEROBIC Blood Culture adequate volume   Culture   Final    NO GROWTH 4 DAYS Performed at [facility], [place] [address], [place], [place] [address]    Report Status PENDING  Incomplete  Urine Culture      Status: Abnormal   Collection Time: 03/28/20  8:19 PM   Specimen: Urine, Catheterized  Result Value Ref Range Status   Specimen Description URINE, CATHETERIZED  Final   Special Requests   Final    NONE Performed at [facility], [place] [address] [place], [place] [address]    Culture >=100,000 COLONIES/mL KLEBSIELLA ORNITHINOLYTICA (A)  Final   Report Status 03/30/2020 FINAL  Final   Organism ID, Bacteria KLEBSIELLA ORNITHINOLYTICA (A)  Final      Susceptibility   Klebsiella ornithinolytica - MIC*    AMPICILLIN RESISTANT Resistant     CEFAZOLIN <=4 SENSITIVE Sensitive     CEFTRIAXONE <=1 SENSITIVE Sensitive     CIPROFLOXACIN <=0.25 SENSITIVE Sensitive     GENTAMICIN <=1 SENSITIVE Sensitive     IMIPENEM <=0.25 SENSITIVE Sensitive     NITROFURANTOIN 64 INTERMEDIATE Intermediate     TRIMETH/SULFA <=20 SENSITIVE Sensitive     AMPICILLIN/SULBACTAM 4 SENSITIVE Sensitive     PIP/TAZO <=4 SENSITIVE Sensitive     * >=100,000 COLONIES/mL KLEBSIELLA ORNITHINOLYTICA  MRSA PCR Screening     Status: Abnormal   Collection Time: 03/28/20 11:03 PM   Specimen: Nasal Mucosa; Nasopharyngeal  Result Value Ref Range Status   MRSA by PCR POSITIVE (A) NEGATIVE Final    Comment:        The GeneXpert MRSA Assay (FDA approved for NASAL specimens only), is one component of a comprehensive MRSA colonization surveillance program. It is not intended to diagnose MRSA infection nor to guide or monitor treatment for MRSA infections. RESULT CALLED TO, READ BACK BY AND VERIFIED WITH: Q LOFTON TN 03/29/20 0034 JDW Performed at [facility], [place] [address], [place], [place] [address]     RADIOLOGY STUDIES/RESULTS: No results found.   LOS: 4 days   Oren Binet, MD  [facility]    To contact the attending provider between 7A-7P or the covering provider during after hours 7P-7A, please log into the web site [email or web address] and  access using universal [place] password for that web  site. If you do not have the password, please call the hospital operator.  04/01/2020, 2:42 PM

## 2020-04-01 NOTE — TOC Progression Note (Signed)
Transition of Care [facility]) - Progression Note    Patient Details  Name: TAJI SATHER MRN: [id number] Date of Birth: [birth year]  Transition of [facility]) CM/SW Contact  Bartholomew Crews, RN Phone Number: [phone]  04/01/2020, 1:20 PM  Clinical Narrative:     Spoke with patient and spouse at the bedside. Spouse wanting patient to return to [facility] for therapy but states that [facility] is asking for $2000 up front. Spouse states that he spoke with someone at [facility] who advised that patient did not have a waiting period to go back to day 1.   Spouse agreeable to NCM reaching out to [facility] to confirm benefit. Verified that [facility] does follow the medicare guidelines, therefore, require that a member be out of facility either rehab or acute for at least 60 days in order to get a new benefit period. Discussed with spouse who stated that the $2000 was for 10 days, but he states that he doesn't have it. He has worked with social workers with [place] for [employer] but they keep saying he makes too much, but yet he cannot afford to place her in a facility custodially. Spouse expressed his frustration of working all his life and now he can't get the help he needs to care for his wife. He further states that he can't work a job for $15/hr to pay a caregiver $30/hr. He asks what is he supposed to do? He doesn't want to "put her away," but he can't care for her unless "she can walk." When asked about anyone who can assist, he said they have a son and daughter, but they aren't available to help out. Spouse asks how he would get her home? He doesn't want her to be just lying in the bed. Spouse went on to describe "plan B" of paying off their plots and then taking some pills and never waking up.   Statement made by spouse was discussed with supervisor, Towanda Octave. Attending MD, Dr. Oren Binet, was made aware. Message left with [facility] APS to report statement.   Spouse requesting assistance talking  with DSS social worker about long term medicaid assistance. Contact number provided to spouse.   TOC team continuing to follow for transition needs.   Expected Discharge Plan: [place] Barriers to Discharge: Continued Medical Work up, Ship broker  Expected Discharge Plan and Services Expected Discharge Plan: [place] In-house Referral: Clinical Social Work Discharge Planning Services: CM Consult Post Acute Care Choice: [place] Living arrangements for the past 2 months: [place], [place]                                       Social Determinants of Health (SDOH) Interventions    Readmission Risk Interventions Readmission Risk Prevention Plan 03/31/2020 01/17/2020  Transportation Screening Complete Complete  PCP or Specialist Appt within 3-5 Days - Not Complete  Not Complete comments - plan for SNF  HRI or [place] - Complete  Social Work Consult for [place] Planning/Counseling - Complete  Palliative Care Screening - Complete  Medication Review Press photographer) Referral to Pharmacy Referral to Pharmacy  PCP or Specialist appointment within 3-5 days of discharge Complete -  [place] or Home Care Consult Complete -  SW Recovery Care/Counseling Consult Complete -  Palliative Care Screening Not Applicable -  [place] Complete -  Some recent data might be hidden

## 2020-04-01 NOTE — TOC Progression Note (Addendum)
Transition of Care [facility]) - Progression Note    Patient Details  Name: Amanda [name] MRN: [id number] Date of Birth: [birth year]  Transition of Care [facility]) CM/SW Contact  Bartholomew Crews, RN Phone Number: [phone] 04/01/2020, 3:32 PM  Clinical Narrative:     Received call back from [place] - report filed.   Discussed with spouse about his "plan B" comment. Spouse stated that he was just blowing off steam.   Per spouse permission spoke with Ms. Quentin Cornwall, SW at [facility], who had assisted with patient getting long term Medicaid. Ms. Quentin Cornwall stated that spouse was told that it would take 45 days to get approval although no guarantee of approval could be made, however, if approved, then Medicaid would go back to day 1. Spouse feeling uneasy about accumulating bills that he pulled patient out of facility on day 21. Patient was home for a day before having to be brought to hospital.   Spouse stated that patient did have [place] home health services for RN, PT, OT, and aide. NCM confirmed with [name] that patient is active, and will accept care to resume services if patient discharges home. Patient would need [place] orders for RN, PT, OT, Aide and SW at discharge.   Spouse states that he is working on a grant through the [employer] that may help cover cost of copays for a few days. Spouse would also like to purchase a steady like what is used in the hospital stating it would help him to transfer patient from bed to chair to bathroom as needed.   Additionally, spouse stated that he would need wheelchair transportation to be able to get patient to appointments. Discussed applying for [employer] ([employer] [place]) for transportation.  Patient has a transport chair that was gifted to her, but may benefit from having a lightweight manual wheelchair. If patient transitions home from hospital, will need ambulance transportation.   Spouse stated that he would prefer that patient not return to nursing home saying  that she was being cared for very well.   Spouse stated that both adult kids have their hands full with their own lives and don't have suggestions about transition plans. Permission not provided to discuss planning with son or daughter.   NCM asked patient what she wanted at discharge. Patient stated that she wanted to go home.   TOC following for transition needs.   Expected Discharge Plan: [place] Barriers to Discharge: Continued Medical Work up, Ship broker  Expected Discharge Plan and Services Expected Discharge Plan: [place] In-house Referral: Clinical Social Work Discharge Planning Services: CM Consult Post Acute Care Choice: [place] Living arrangements for the past 2 months: [place], [place]                                       Social Determinants of Health (SDOH) Interventions    Readmission Risk Interventions Readmission Risk Prevention Plan 03/31/2020 01/17/2020  Transportation Screening Complete Complete  PCP or Specialist Appt within 3-5 Days - Not Complete  Not Complete comments - plan for SNF  HRI or [place] - Complete  Social Work Consult for [place] Planning/Counseling - Complete  Palliative Care Screening - Complete  Medication Review (RN Care Manager) Referral to Pharmacy Referral to Pharmacy  PCP or Specialist appointment within 3-5 days of discharge Complete -  [place] or  Home Care Consult Complete -  SW Recovery Care/Counseling Consult Complete -  Palliative Care Screening Not Applicable -  Skilled Nursing Facility Complete -  Some recent data might be hidden

## 2020-04-02 ENCOUNTER — Other Ambulatory Visit: Payer: Self-pay

## 2020-04-02 DIAGNOSIS — N179 Acute kidney failure, unspecified: Secondary | ICD-10-CM

## 2020-04-02 LAB — CBC
HCT: 30.9 % — ABNORMAL LOW (ref 36.0–46.0)
Hemoglobin: 9.6 g/dL — ABNORMAL LOW (ref 12.0–15.0)
MCH: 28 pg (ref 26.0–34.0)
MCHC: 31.1 g/dL (ref 30.0–36.0)
MCV: 90.1 fL (ref 80.0–100.0)
Platelets: 198 10*3/uL (ref 150–400)
RBC: 3.43 MIL/uL — ABNORMAL LOW (ref 3.87–5.11)
RDW: 20.9 % — ABNORMAL HIGH (ref 11.5–15.5)
WBC: 11.3 10*3/uL — ABNORMAL HIGH (ref 4.0–10.5)
nRBC: 0 % (ref 0.0–0.2)

## 2020-04-02 LAB — BASIC METABOLIC PANEL
Anion gap: 10 (ref 5–15)
BUN: 17 mg/dL (ref 8–23)
CO2: 21 mmol/L — ABNORMAL LOW (ref 22–32)
Calcium: 8.2 mg/dL — ABNORMAL LOW (ref 8.9–10.3)
Chloride: 107 mmol/L (ref 98–111)
Creatinine, Ser: 1.36 mg/dL — ABNORMAL HIGH (ref 0.44–1.00)
GFR calc Af Amer: 42 mL/min — ABNORMAL LOW (ref >60)
GFR calc non Af Amer: 37 mL/min — ABNORMAL LOW (ref >60)
Glucose, Bld: 92 mg/dL (ref 70–99)
Potassium: 3.7 mmol/L (ref 3.5–5.1)
Sodium: 138 mmol/L (ref 135–145)

## 2020-04-02 LAB — CULTURE, BLOOD (ROUTINE X 2)
Culture: NO GROWTH
Culture: NO GROWTH
Special Requests: ADEQUATE
Special Requests: ADEQUATE

## 2020-04-02 LAB — GLUCOSE, CAPILLARY: Glucose-Capillary: 85 mg/dL (ref 70–99)

## 2020-04-02 MED ORDER — FUROSEMIDE 40 MG PO TABS: 20.0000 mg | ORAL_TABLET | Freq: Every day | ORAL | 0 refills | Status: DC

## 2020-04-02 MED ORDER — CHLORHEXIDINE GLUCONATE CLOTH 2 % EX PADS: 6.0000 | MEDICATED_PAD | Freq: Every day | CUTANEOUS | Status: DC

## 2020-04-02 MED ORDER — INSULIN PEN NEEDLE 32G X 8 MM MISC
0 refills | Status: DC
Start: 2020-04-02 — End: 2020-04-07

## 2020-04-02 MED ORDER — INSULIN ASPART 100 UNIT/ML ~~LOC~~ SOLN: 0.0000 [IU] | Freq: Three times a day (TID) | SUBCUTANEOUS | Status: DC

## 2020-04-02 MED ORDER — INSULIN ASPART 100 UNIT/ML FLEXPEN
PEN_INJECTOR | SUBCUTANEOUS | 0 refills | Status: AC
Start: 2020-04-02 — End: ?

## 2020-04-02 NOTE — Progress Notes (Signed)
Transition of Care Supervisor verified with APS that the report was screened out and there will be no additional follow up upon discharge.  Per documentation, patient has not expressed any safety concerns moving forward or with current discharge plan home.  Patient to be followed by [facility] home health following discharge.  Patient appropriate for discharge from Transition of Care perspective.  TOC Supervisor available for any additional support as needed.  Barbette Or, [place]

## 2020-04-02 NOTE — Care Management (Signed)
    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  A cane  will not resolve  issue with performing activities of daily living. A wheelchair will allow patient to safely perform daily activities. 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 Patient can self propel in the lightweight wheelchair. Length of need lifetime . Accessories: elevating leg rests (ELRs), wheel locks, extensions and anti-tippers.  Seat and back cushions   04/02/20 1116

## 2020-04-02 NOTE — Discharge Summary (Signed)
PATIENT DETAILS Name: Amanda [name] Age: 79 y.o. Sex: female Date of Birth: 29-May-1941 MRN: [id number]. Admitting Physician: Eugenie Filler, MD IAX:[id number], Tommi Rumps, NP  Admit Date: 03/28/2020 Discharge date: 04/02/2020  Recommendations for Outpatient Follow-up:  1. Follow up with PCP in 1-2 weeks 2. Please obtain CMP/CBC in one week 3. Please ensure follow-up with endocrinology 4. Amlodipine discontinued 5. Assess for orthostatic hypotension at next follow-up with PCP.  Admitted From:  Home with home health services   Disposition: Home with home health [place]:  Yes  Equipment/Devices: None  Discharge Condition: Stable  CODE STATUS:  DNR  Diet recommendation:  Diet Order            Diet - low sodium heart healthy           Diet Carb Modified           Diet Heart Room service appropriate? Yes with Assist; Fluid consistency: Thin  Diet effective now                  Brief Narrative: Patient is a 79 y.o. 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  See below for further details.  Significant events: 6/11>> admit to [facility] for evaluation of severe weakness-found to have severe hypoglycemia. 6/12>> no diarrhea-no BM since being in the hospital.  Significant studies: 6/11>> chest x-ray: No pneumonia/edema.  Antimicrobial therapy: None  Microbiology data: 6/11>> blood cultures: Negative  6/11>> urine culture:Klebsiella Ornithinolytica  Procedures : None  Consults: None  DVT Prophylaxis : SCDs-given history of upper GI bleeding  Brief Hospital Course: Severe hypoglycemia: In the setting of no oral intake and ongoing insulin therapy on the day of admission.  Managed with the 10 infusion-this was discontinued several  days back.  CBGs now just stable with SSI. Unfortunately did not follow-up with primary endocrinologist for the past few months.  Suspect was  having hypoglycemia at home-as spouse decreased U 500 dosing to daily dosing.   Insulin-dependent DM-2 (A1c 5.4 on 7/07) with complications (diabetic retinopathy, CAD/CVA): Initially all insulin products were held due to severe hypoglycemia-CBGs currently stable with just SSI.  Had spoken with patient's primary endocrinologist-Dr. Letta Median 6/12 over the phone-recommendations were for Lantus (15-25 units) and SSI on discharge.  However CBGs are stable with SSI-patient likely will not require long-acting insulin at this point.  Have instructed spouse to keep a record of her CBGs-and call Dr. Arman Filter office in the next few days for further instructions.  Leukocytosis: Downtrending-likely from stress margination-no obvious sources of infection apparent.  Although has urine culture positive-she has no symptoms of UTI-likely reflects asymptomatic bacteriuria and does not require treatment (apparently has history of C. difficile as well).  Blood cultures remain negative.  Chest x-ray without pneumonia.  Diarrhea has resolved-unlikely to have C. difficile.  Since WBC downtrending on its own-patient very stable-continue to monitor off antimicrobial therapy  Appears to have some amount of chronic leukocytosis at baseline.  Generalized weakness: Suspect secondary to hypoglycemia-but appears to have some amount of chronic debility at baseline-PT/OT eval appreciated-recommendations are for SNF.  Unfortunately-given insurance issues/financial constraints-unable to place at SNF-CM/SW-are recommending home with maximal home health services.  Patient's husband comfortable in taking patient home today.  Hypokalemia: Repleted.  Hypomagnesemia: Repleted  History of liver cirrhosis: Relatively well compensated-continue rifaximin-Lasix/spironolactone. Minimally elevated ammonia  levels  but no signs of encephalopathy.  Patient stopped taking lactulose almost 3 weeks back because of diarrhea-we will follow closely.  CAD: No anginal symptoms-continue aspirin-Per H&P-Plavix discontinued in March 2021.  HTN: BP controlled-had orthostatic hypotension on 6/14-amlodipine discontinued-orthostatic vital signs negative on 6/15.  Continue Lasix/Aldactone cautiously-follow.  CKD stage IIIa: Creatinine close to baseline-follow.  History of portal vein thrombosis: Previously on anticoagulation-but discontinued due to GI bleeding.  Recent history of upper GI bleeding due to AVMs: Hemoglobin stable-no evidence of melena/hematochezia.  History of polycythemia vera/JAK2 mutation: Follows with Dr. Aundra Millet stable.  OSA-CPAP nightly  Obesity: Estimated body mass index is 32.04 kg/m as calculated from the following:   Height as of this encounter: 5' 4.5" (1.638 m).   Weight as of this encounter: 86 kg.   Discharge Diagnoses:  Principal Problem:   Severe diabetic hypoglycemia ([place]) Active Problems:   Hyperlipidemia   Depression   Essential hypertension, benign   DIASTOLIC DYSFUNCTION   Leukocytosis   Diabetic peripheral neuropathy associated with type 2 diabetes mellitus (HCC)   Chronic diastolic CHF (congestive heart failure) (HCC)   Coronary artery disease involving native coronary artery   Obesity (BMI 30-39.9)   Hepatic cirrhosis (HCC)   OSA (obstructive sleep apnea)   Diabetic retinopathy of both eyes (HCC)   GAD (generalized anxiety disorder)   Type 2 diabetes mellitus with diabetic polyneuropathy (HCC)   Dehydration   AKI (acute kidney injury) ([place])   History of CVA (cerebrovascular accident)   CKD (chronic kidney disease), stage III   Weakness generalized   Acute hypokalemia   Discharge Instructions:  Activity:  As tolerated with Full fall precautions use walker/cane & assistance as needed  Discharge Instructions    Call MD for:   difficulty breathing, headache or visual disturbances   Complete by: As directed    Call MD for:  extreme fatigue   Complete by: As directed    Call MD for:  persistant dizziness or light-headedness   Complete by: As directed    Call MD for:  persistant nausea and vomiting   Complete by: As directed    Diet - low sodium heart healthy   Complete by: As directed    Diet Carb Modified   Complete by: As directed    Discharge instructions   Complete by: As directed    1.)  Please check CBGs several times a day-one of them should be a fasting level-keep a record of these readings-and call your primary endocrinologist's office in the next 2 days for further instructions.  2.)  You will be on a sliding scale insulin for your diabetes-all of your other diabetic medications-including Trulicity, Humulin RU 903 have been discontinued.  Please follow-up with your primary endocrinologist office for further instructions.  3.)  Your blood pressure dropped when you stood up-hence amlodipine has been discontinued.  Lasix dose has been decreased to 20 mg.  Please ask your primary care practitioner to repeat orthostatic vital signs at next visit.   Follow with Primary MD  [name], NP in 1-2 weeks  Please get a complete blood count and chemistry panel checked by your Primary MD at your next visit, and again as instructed by your Primary MD.  Get Medicines reviewed and adjusted: Please take all your medications with you for your next visit with your Primary MD  Laboratory/radiological data: Please request your Primary MD to go over all hospital tests and procedure/radiological results at the follow up, please ask your Primary MD  to get all Hospital records sent to his/her office.  In some cases, they will be blood work, cultures and biopsy results pending at the time of your discharge. Please request that your primary care M.D. follows up on these results.  Also Note the following: If you experience  worsening of your admission symptoms, develop shortness of breath, life threatening emergency, suicidal or homicidal thoughts you must seek medical attention immediately by calling 911 or calling your MD immediately  if symptoms less severe.  You must read complete instructions/literature along with all the possible adverse reactions/side effects for all the Medicines you take and that have been prescribed to you. Take any new Medicines after you have completely understood and accpet all the possible adverse reactions/side effects.   Do not drive when taking Pain medications or sleeping medications (Benzodaizepines)  Do not take more than prescribed Pain, Sleep and Anxiety Medications. It is not advisable to combine anxiety,sleep and pain medications without talking with your primary care practitioner  Special Instructions: If you have smoked or chewed Tobacco  in the last 2 yrs please stop smoking, stop any regular Alcohol  and or any Recreational drug use.  Wear Seat belts while driving.  Please note: You were cared for by a hospitalist during your hospital stay. Once you are discharged, your primary care physician will handle any further medical issues. Please note that NO REFILLS for any discharge medications will be authorized once you are discharged, as it is imperative that you return to your primary care physician (or establish a relationship with a primary care physician if you do not have one) for your post hospital discharge needs so that they can reassess your need for medications and monitor your lab values.   Increase activity slowly   Complete by: As directed      Allergies as of 04/02/2020      Reactions   Doxycycline Hives, Swelling, Rash   Penicillins Swelling, Other (See Comments)   Face swells Has patient had a PCN reaction causing immediate rash, facial/tongue/throat swelling, SOB or lightheadedness with hypotension: Yes Has patient had a PCN reaction causing severe rash  involving mucus membranes or skin necrosis: No Has patient had a PCN reaction that required hospitalization: No Has patient had a PCN reaction occurring within the last 10 years: No If all of the above answers are "NO", then may proceed with Cephalosporin use.   Lisinopril Cough   Tape Hives   Latex Hives, Itching, Rash      Medication List    STOP taking these medications   amLODipine 5 MG tablet Commonly known as: NORVASC   HumuLIN R U-500 KwikPen 500 UNIT/ML kwikpen Generic drug: insulin regular human CONCENTRATED   insulin aspart 100 UNIT/ML injection Commonly known as: novoLOG Replaced by: insulin aspart 100 UNIT/ML FlexPen   Trulicity 1.5 ZR/0.0TM Sopn Generic drug: Dulaglutide     TAKE these medications   Accu-Chek Guide w/Device Kit 1 kit by Does not apply route as directed.   citalopram 10 MG tablet Commonly known as: CeleXA Take 1 tablet (10 mg total) by mouth daily.   clopidogrel 75 MG tablet Commonly known as: PLAVIX Take 75 mg by mouth daily.   ezetimibe 10 MG tablet Commonly known as: ZETIA Take 1 tablet (10 mg total) by mouth daily.   furosemide 40 MG tablet Commonly known as: LASIX Take 0.5 tablets (20 mg total) by mouth daily. What changed: See the new instructions.   glucose blood test strip Commonly  known as: Accu-Chek Guide Use to check blood sugars 3 times daily   OneTouch Verio test strip Generic drug: glucose blood USE TO TEST BLOOD SUGAR THREE TIMES DAILY   insulin aspart 100 UNIT/ML FlexPen Commonly known as: NOVOLOG 0-9 Units, Subcutaneous, 3 times daily with meals CBG < 70: Implement Hypoglycemia measures, Call primary MD CBG 70 - 120: 0 units CBG 121 - 150: 1 unit CBG 151 - 200: 2 units CBG 201 - 250: 3 units CBG 251 - 300: 5 units CBG 301 - 350: 7 units CBG 351 - 400: 9 units CBG > 400: call primary MD Replaces: insulin aspart 100 UNIT/ML injection   Insulin Pen Needle 32G X 8 MM Misc Use as directed   Jakafi 25 MG  tablet Generic drug: ruxolitinib phosphate Take 25 mg by mouth 2 (two) times daily.   latanoprost 0.005 % ophthalmic solution Commonly known as: XALATAN Place 1 drop into both eyes at bedtime.   lidocaine-prilocaine cream Commonly known as: EMLA Apply 1 application topically as needed. Please apply EMLA over the Port-A-Cath site 1 hour prior to coming to our office. What changed:   when to take this  reasons to take this  additional instructions   loperamide 2 MG tablet Commonly known as: IMODIUM A-D Take 2 mg by mouth 4 (four) times daily as needed for diarrhea or loose stools.   mirtazapine 15 MG tablet Commonly known as: REMERON Take 1 tablet (15 mg total) by mouth at bedtime.   pantoprazole 40 MG tablet Commonly known as: PROTONIX Take 1 tablet (40 mg total) by mouth 2 (two) times daily before a meal.   spironolactone 25 MG tablet Commonly known as: ALDACTONE Take 0.5 tablets (12.5 mg total) by mouth daily.   timolol 0.5 % ophthalmic solution Commonly known as: TIMOPTIC Place 1 drop into both eyes daily.   triamcinolone cream 0.1 % Commonly known as: KENALOG Apply 1 application topically 2 (two) times daily. What changed:   when to take this  reasons to take this   Xifaxan 550 MG Tabs tablet Generic drug: rifaximin Take 1 tablet (550 mg total) by mouth 2 (two) times daily.            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  A cane  will not resolve  issue with performing activities of daily living. A wheelchair will allow patient to safely perform daily activities. 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 Patient can self propel in the lightweight wheelchair. Length of need lifetime . Accessories: elevating leg rests (ELRs), wheel locks, extensions and anti-tippers.  Seat and back cushions   04/02/20 1116          Follow-up Information    Nafziger, Tommi Rumps, NP. Schedule an appointment as soon as possible for a visit in 1 week(s).   Specialty: Family Medicine Contact information: [place] [address] [phone]        [name], MD. Schedule an appointment as soon as possible for a visit in 1 week(s).  Specialty: Internal Medicine Contact information: [address] [place] [address] [phone]              Allergies  Allergen Reactions  . Doxycycline Hives, Swelling and Rash  . Penicillins Swelling and Other (See Comments)    Face swells Has patient had a PCN reaction causing immediate rash, facial/tongue/throat swelling, SOB or lightheadedness with hypotension: Yes Has patient had a PCN reaction causing severe rash involving mucus membranes or skin necrosis: No Has patient had a PCN reaction that required hospitalization: No Has patient had a PCN reaction occurring within the last 10 years: No If all of the above answers are "NO", then may proceed with Cephalosporin use.  Marland Kitchen Lisinopril Cough  . Tape Hives  . Latex Hives, Itching and Rash     Other Procedures/Studies: DG Chest Port 1 View  Result Date: 03/28/2020 CLINICAL DATA:  Atrial fibrillation.  Dizziness. EXAM: PORTABLE CHEST 1 VIEW COMPARISON:  Feb 17, 2020 FINDINGS: Port-A-Cath tip is in the superior vena cava. No pneumothorax. There is no edema or airspace opacity. Heart is upper normal in size with pulmonary vascularity within normal limits. No adenopathy. No bone lesions. IMPRESSION: Port-A-Cath tip in superior vena cava. No pneumothorax. No edema or airspace opacity. Stable  cardiac silhouette. Electronically Signed   By: Lowella Grip III M.D.   On: 03/28/2020 16:17     TODAY-DAY OF DISCHARGE:  Subjective:   Amanda [name] today has no headache,no chest abdominal pain,no new weakness tingling or numbness, feels much better wants to go home today.   Objective:   Blood pressure 123/64, pulse 69, temperature 97.7 F (36.5 C), temperature source Oral, resp. rate 13, height 5' 4.5" (1.638 m), weight 86 kg, SpO2 99 %.  Intake/Output Summary (Last 24 hours) at 04/02/2020 1140 Last data filed at 04/02/2020 0634 Gross per 24 hour  Intake 120 ml  Output 700 ml  Net -580 ml   Filed Weights   03/28/20 1527 03/28/20 2100 03/31/20 0400  Weight: 87.1 kg 85.7 kg 86 kg    Exam: Awake Alert, Oriented *3, No new F.N deficits, Normal affect [place].AT,PERRAL Supple Neck,No JVD, No cervical lymphadenopathy appriciated.  Symmetrical Chest wall movement, Good air movement bilaterally, CTAB RRR,No Gallops,Rubs or new Murmurs, No Parasternal Heave +ve B.Sounds, Abd Soft, Non tender, No organomegaly appriciated, No rebound -guarding or rigidity. No Cyanosis, Clubbing or edema, No new Rash or bruise   PERTINENT RADIOLOGIC STUDIES: No results found.   PERTINENT LAB RESULTS: CBC: Recent Labs    04/02/20 0406  WBC 11.3*  HGB 9.6*  HCT 30.9*  PLT 198   CMET CMP     Component Value Date/Time   NA 138 04/02/2020 0406   NA 140 10/12/2017 1013   NA 138 01/21/2017 0806   K 3.7 04/02/2020 0406   K 3.7 10/12/2017 1013   K 4.1 01/21/2017 0806   CL 107 04/02/2020 0406   CL 103 10/12/2017 1013   CL 103 02/16/2013 0900   CO2 21 (L) 04/02/2020 0406   CO2 25 10/12/2017 1013   CO2 21 (L) 01/21/2017 0806   GLUCOSE 92 04/02/2020 0406   GLUCOSE 424 (H) 10/12/2017 1013   GLUCOSE 396 (H) 02/16/2013 0900   BUN 17 04/02/2020 0406   BUN 10 10/12/2017 1013   BUN 12.2 01/21/2017 0806   CREATININE 1.36 (H) 04/02/2020 0406   CREATININE 1.12 (H) 03/04/2020 1251   CREATININE  0.9 10/12/2017 1013   CREATININE 1.0 01/21/2017 0806  CALCIUM 8.2 (L) 04/02/2020 0406   CALCIUM 8.7 10/12/2017 1013   CALCIUM 9.3 01/21/2017 0806   PROT 4.9 (L) 03/30/2020 0515   PROT 6.2 (L) 10/12/2017 1013   PROT 6.5 01/21/2017 0806   ALBUMIN 2.4 (L) 03/30/2020 0515   ALBUMIN 3.4 10/12/2017 1013   ALBUMIN 3.5 01/21/2017 0806   AST 27 03/30/2020 0515   AST 88 (H) 03/04/2020 1251   AST 37 (H) 01/21/2017 0806   ALT 8 03/30/2020 0515   ALT 18 03/04/2020 1251   ALT 25 10/12/2017 1013   ALT 16 01/21/2017 0806   ALKPHOS 108 03/30/2020 0515   ALKPHOS 97 (H) 10/12/2017 1013   ALKPHOS 112 01/21/2017 0806   BILITOT 2.2 (H) 03/30/2020 0515   BILITOT 1.4 (H) 03/04/2020 1251   BILITOT 0.89 01/21/2017 0806   GFRNONAA 37 (L) 04/02/2020 0406   GFRNONAA 46 (L) 03/04/2020 1251   GFRNONAA 51 (L) 02/05/2016 0933   GFRAA 42 (L) 04/02/2020 0406   GFRAA 54 (L) 03/04/2020 1251   GFRAA 59 (L) 02/05/2016 0933    GFR Estimated Creatinine Clearance: 35.4 mL/min (A) (by C-G formula based on SCr of 1.36 mg/dL (H)). No results for input(s): LIPASE, AMYLASE in the last 72 hours. No results for input(s): CKTOTAL, CKMB, CKMBINDEX, TROPONINI in the last 72 hours. Invalid input(s): POCBNP No results for input(s): DDIMER in the last 72 hours. No results for input(s): HGBA1C in the last 72 hours. No results for input(s): CHOL, HDL, LDLCALC, TRIG, CHOLHDL, LDLDIRECT in the last 72 hours. No results for input(s): TSH, T4TOTAL, T3FREE, THYROIDAB in the last 72 hours.  Invalid input(s): FREET3 No results for input(s): VITAMINB12, FOLATE, FERRITIN, TIBC, IRON, RETICCTPCT in the last 72 hours. Coags: No results for input(s): INR in the last 72 hours.  Invalid input(s): PT Microbiology: Recent Results (from the past 240 hour(s))  SARS Coronavirus 2 by RT PCR (hospital order, performed in [facility] hospital lab) Nasopharyngeal Nasopharyngeal Swab     Status: None   Collection Time: 03/28/20  5:12 PM    Specimen: Nasopharyngeal Swab  Result Value Ref Range Status   SARS Coronavirus 2 NEGATIVE NEGATIVE Final    Comment: (NOTE) SARS-CoV-2 target nucleic acids are NOT DETECTED.  The SARS-CoV-2 RNA is generally detectable in upper and lower respiratory specimens during the acute phase of infection. The lowest concentration of SARS-CoV-2 viral copies this assay can detect is 250 copies / mL. A negative result does not preclude SARS-CoV-2 infection and should not be used as the sole basis for treatment or other patient management decisions.  A negative result may occur with improper specimen collection / handling, submission of specimen other than nasopharyngeal swab, presence of viral mutation(s) within the areas targeted by this assay, and inadequate number of viral copies (<250 copies / mL). A negative result must be combined with clinical observations, patient history, and epidemiological information.  Fact Sheet for Patients:   StrictlyIdeas.no  Fact Sheet for Healthcare Providers: BankingDealers.co.za  This test is not yet approved or  cleared by the [place] FDA and has been authorized for detection and/or diagnosis of SARS-CoV-2 by FDA under an Emergency Use Authorization (EUA).  This EUA will remain in effect (meaning this test can be used) for the duration of the COVID-19 declaration under Section 564(b)(1) of the Act, 21 U.S.C. section 360bbb-3(b)(1), unless the authorization is terminated or revoked sooner.  Performed at [facility], [place] [address] [place], [place] [address]   Blood culture (routine  x 2)     Status: None (Preliminary result)   Collection Time: 03/28/20  7:34 PM   Specimen: BLOOD  Result Value Ref Range Status   Specimen Description BLOOD RIGHT ANTECUBITAL  Final   Special Requests   Final    BOTTLES DRAWN AEROBIC ONLY Blood Culture adequate volume   Culture   Final    NO GROWTH 4  DAYS Performed at [facility], [address] [place], [place] [address]    Report Status PENDING  Incomplete  Blood culture (routine x 2)     Status: None (Preliminary result)   Collection Time: 03/28/20  7:41 PM   Specimen: BLOOD RIGHT ARM  Result Value Ref Range Status   Specimen Description BLOOD RIGHT ARM  Final   Special Requests   Final    BOTTLES DRAWN AEROBIC AND ANAEROBIC Blood Culture adequate volume   Culture   Final    NO GROWTH 4 DAYS Performed at [facility], [place] [address], [place], [place] [address]    Report Status PENDING  Incomplete  Urine Culture     Status: Abnormal   Collection Time: 03/28/20  8:19 PM   Specimen: Urine, Catheterized  Result Value Ref Range Status   Specimen Description URINE, CATHETERIZED  Final   Special Requests   Final    NONE Performed at [facility], [place] [address] [place], [place] [address]    Culture >=100,000 COLONIES/mL KLEBSIELLA ORNITHINOLYTICA (A)  Final   Report Status 03/30/2020 FINAL  Final   Organism ID, Bacteria KLEBSIELLA ORNITHINOLYTICA (A)  Final      Susceptibility   Klebsiella ornithinolytica - MIC*    AMPICILLIN RESISTANT Resistant     CEFAZOLIN <=4 SENSITIVE Sensitive     CEFTRIAXONE <=1 SENSITIVE Sensitive     CIPROFLOXACIN <=0.25 SENSITIVE Sensitive     GENTAMICIN <=1 SENSITIVE Sensitive     IMIPENEM <=0.25 SENSITIVE Sensitive     NITROFURANTOIN 64 INTERMEDIATE Intermediate     TRIMETH/SULFA <=20 SENSITIVE Sensitive     AMPICILLIN/SULBACTAM 4 SENSITIVE Sensitive     PIP/TAZO <=4 SENSITIVE Sensitive     * >=100,000 COLONIES/mL KLEBSIELLA ORNITHINOLYTICA  MRSA PCR Screening     Status: Abnormal   Collection Time: 03/28/20 11:03 PM   Specimen: Nasal Mucosa; Nasopharyngeal  Result Value Ref Range Status   MRSA by PCR POSITIVE (A) NEGATIVE Final    Comment:        The GeneXpert MRSA Assay (FDA approved for NASAL specimens only), is one component of a comprehensive MRSA  colonization surveillance program. It is not intended to diagnose MRSA infection nor to guide or monitor treatment for MRSA infections. RESULT CALLED TO, READ BACK BY AND VERIFIED WITH: Q LOFTON TN 03/29/20 0034 JDW Performed at [facility], [place] [address] [place], [place] [address]     FURTHER DISCHARGE INSTRUCTIONS:  Get Medicines reviewed and adjusted: Please take all your medications with you for your next visit with your Primary MD  Laboratory/radiological data: Please request your Primary MD to go over all hospital tests and procedure/radiological results at the follow up, please ask your Primary MD to get all Hospital records sent to his/her office.  In some cases, they will be blood work, cultures and biopsy results pending at the time of your discharge. Please request that your primary care M.D. goes through all the records of your hospital data and follows up on these results.  Also Note the following: If  you experience worsening of your admission symptoms, develop shortness of breath, life threatening emergency, suicidal or homicidal thoughts you must seek medical attention immediately by calling 911 or calling your MD immediately  if symptoms less severe.  You must read complete instructions/literature along with all the possible adverse reactions/side effects for all the Medicines you take and that have been prescribed to you. Take any new Medicines after you have completely understood and accpet all the possible adverse reactions/side effects.   Do not drive when taking Pain medications or sleeping medications (Benzodaizepines)  Do not take more than prescribed Pain, Sleep and Anxiety Medications. It is not advisable to combine anxiety,sleep and pain medications without talking with your primary care practitioner  Special Instructions: If you have smoked or chewed Tobacco  in the last 2 yrs please stop smoking, stop any regular Alcohol  and or any Recreational drug  use.  Wear Seat belts while driving.  Please note: You were cared for by a hospitalist during your hospital stay. Once you are discharged, your primary care physician will handle any further medical issues. Please note that NO REFILLS for any discharge medications will be authorized once you are discharged, as it is imperative that you return to your primary care physician (or establish a relationship with a primary care physician if you do not have one) for your post hospital discharge needs so that they can reassess your need for medications and monitor your lab values.  Total Time spent coordinating discharge including counseling, education and face to face time equals 35 minutes.  SignedOren Binet 04/02/2020 11:40 AM

## 2020-04-02 NOTE — Progress Notes (Signed)
Amanda [name] to be D/C'd home per MD order. Discussed with the patient and spouse and all questions fully answered.   VVS, Skin clean, dry and intact without evidence of skin break down, no evidence of skin tears noted. Pt was discharged with Rt chest port.  Site without signs and symptoms of complications. An After Visit Summary was printed and given to spouse.  Patient was transported by [facility].  [name]  04/02/2020 4:15 PM

## 2020-04-02 NOTE — TOC Progression Note (Addendum)
Transition of Care [facility]) - Progression Note    Patient Details  Name: TAKILA KRONBERG MRN: [id number] Date of Birth: [birth year]  [facility]) CM/SW Contact  Jacalyn Lefevre Edson Snowball, RN Phone Number: 04/02/2020, 11:03 AM  Clinical Narrative:     Received a message to call patient's husband. Returned call to [employer] [phone].   See previous  TOC notes.   [name] wants to take his wife home at discharge with [facility] [place] home health RN,PT,OT and an aide.  Patient has 3 in1 , transfer bench, walker and transport chair at home already. [name] has rented a stedy . He is requesting a wheel chair be shipped to home address. Confirmed Epic address.   [name] is requesting [facility] transport home at discharge. He would also like MD to call him.   Spoke to [place] TOC lead. As already documented APS report was placed yesterday. Patient can discharge home , APS to follow.   Discussed above with MD, and requested home health orders.   Spoke to [place] with [facility] will accept patient back.   Will order light weight wheel chair and ask Zack with [facility] to ship to home address.   [place] requested [facility] for 1 pm. Called husband , he is in agreement for transport for 1pm today. [facility] called paperwork on chart.   Expected Discharge Plan: [place] Barriers to Discharge: Continued Medical Work up, Ship broker  Expected Discharge Plan and Services Expected Discharge Plan: [place] In-house Referral: Clinical Social Work Discharge Planning Services: CM Consult Post Acute Care Choice: [place] Living arrangements for the past 2 months: [place], [place]                                       Social Determinants of Health (SDOH) Interventions    Readmission Risk Interventions Readmission Risk Prevention Plan 03/31/2020 01/17/2020  Transportation Screening Complete Complete  PCP or  Specialist Appt within 3-5 Days - Not Complete  Not Complete comments - plan for SNF  [place] or [place] - Complete  Social Work Consult for [place] Planning/Counseling - Complete  Palliative Care Screening - Complete  Medication Review Press photographer) Referral to Pharmacy Referral to Pharmacy  PCP or Specialist appointment within 3-5 days of discharge Complete -  [place] or Home Care Consult Complete -  SW Recovery Care/Counseling Consult Complete -  Palliative Care Screening Not Applicable -  [place] Complete -  Some recent data might be hidden

## 2020-04-02 NOTE — Progress Notes (Signed)
Pt refused to take her ruxolitinib phosphate (JAKAFI) tablet 25 mg. She said it is too big for her to swallow. Pt was informed that med cannot be crushed. Med is disposed in the biohazard bin.

## 2020-04-02 NOTE — Discharge Instructions (Signed)
Hypoglycemia Hypoglycemia is when the sugar (glucose) level in your blood is too low. Signs of low blood sugar may include:  Feeling: ? Hungry. ? Worried or nervous (anxious). ? Sweaty and clammy. ? Confused. ? Dizzy. ? Sleepy. ? Sick to your stomach (nauseous).  Having: ? A fast heartbeat. ? A headache. ? A change in your vision. ? Tingling or no feeling (numbness) around your mouth, lips, or tongue. ? Jerky movements that you cannot control (seizure).  Having trouble with: ? Moving (coordination). ? Sleeping. ? Passing out (fainting). ? Getting upset easily (irritability). Low blood sugar can happen to people who have diabetes and people who do not have diabetes. Low blood sugar can happen quickly, and it can be an emergency. Treating low blood sugar Low blood sugar is often treated by eating or drinking something sugary right away, such as:  Fruit juice, 4-6 oz (120-150 mL).  Regular soda (not diet soda), 4-6 oz (120-150 mL).  Low-fat milk, 4 oz (120 mL).  Several pieces of hard candy.  Sugar or honey, 1 Tbsp (15 mL). Treating low blood sugar if you have diabetes If you can think clearly and swallow safely, follow the 15:15 rule:  Take 15 grams of a fast-acting carb (carbohydrate). Talk with your doctor about how much you should take.  Always keep a source of fast-acting carb with you, such as: ? Sugar tablets (glucose pills). Take 3-4 pills. ? 6-8 pieces of hard candy. ? 4-6 oz (120-150 mL) of fruit juice. ? 4-6 oz (120-150 mL) of regular (not diet) soda. ? 1 Tbsp (15 mL) honey or sugar.  Check your blood sugar 15 minutes after you take the carb.  If your blood sugar is still at or below 70 mg/dL (3.9 mmol/L), take 15 grams of a carb again.  If your blood sugar does not go above 70 mg/dL (3.9 mmol/L) after 3 tries, get help right away.  After your blood sugar goes back to normal, eat a meal or a snack within 1 hour.  Treating very low blood sugar If your  blood sugar is at or below 54 mg/dL (3 mmol/L), you have very low blood sugar (severe hypoglycemia). This may also cause:  Passing out.  Jerky movements you cannot control (seizure).  Losing consciousness (coma). This is an emergency. Do not wait to see if the symptoms will go away. Get medical help right away. Call your local emergency services (911 in the [place] Do not drive yourself to the hospital. If you have very low blood sugar and you cannot eat or drink, you may need a glucagon shot (injection). A family member or friend should learn how to check your blood sugar and how to give you a glucagon shot. Ask your doctor if you need to have a glucagon shot kit at home. Follow these instructions at home: General instructions  Take over-the-counter and prescription medicines only as told by your doctor.  Stay aware of your blood sugar as told by your doctor.  Limit alcohol intake to no more than 1 drink a day for nonpregnant women and 2 drinks a day for men. One drink equals 12 oz of beer (355 mL), 5 oz of wine (148 mL), or 1 oz of hard liquor (44 mL).  Keep all follow-up visits as told by your doctor. This is important. If you have diabetes:   Follow your diabetes care plan as told by your doctor. Make sure you: ? Know the signs of low blood sugar. ?  Take your medicines as told. ? Follow your exercise and meal plan. ? Eat on time. Do not skip meals. ? Check your blood sugar as often as told by your doctor. Always check it before and after exercise. ? Follow your sick day plan when you cannot eat or drink normally. Make this plan ahead of time with your doctor.  Share your diabetes care plan with: ? Your work or school. ? People you live with.  Check your pee (urine) for ketones: ? When you are sick. ? As told by your doctor.  Carry a card or wear jewelry that says you have diabetes. Contact a doctor if:  You have trouble keeping your blood sugar in your target  range.  You have low blood sugar often. Get help right away if:  You still have symptoms after you eat or drink something sugary.  Your blood sugar is at or below 54 mg/dL (3 mmol/L).  You have jerky movements that you cannot control.  You pass out. These symptoms may be an emergency. Do not wait to see if the symptoms will go away. Get medical help right away. Call your local emergency services (911 in the [place] Do not drive yourself to the hospital. Summary  Hypoglycemia happens when the level of sugar (glucose) in your blood is too low.  Low blood sugar can happen to people who have diabetes and people who do not have diabetes. Low blood sugar can happen quickly, and it can be an emergency.  Make sure you know the signs of low blood sugar and know how to treat it.  Always keep a source of sugar (fast-acting carb) with you to treat low blood sugar. This information is not intended to replace advice given to you by your health care provider. Make sure you discuss any questions you have with your health care provider. Document Revised: 01/25/2019 Document Reviewed: 11/07/2015 Elsevier Patient Education  2020 Elsevier Inc.  

## 2020-04-02 NOTE — Care Management Important Message (Signed)
Important Message  Patient Details  Name: Amanda [name] MRN: [id number] Date of Birth: 12-19-1940   Medicare Important Message Given:  Yes - Important Message mailed due to current National Emergency  Verbal consent obtained due to current National Emergency  Relationship to patient: Self Contact Name: Marlet Korte Call Date: 04/02/20  Time: 1321 Phone: [phone] Outcome: Spoke with contact Important Message mailed to: Patient address on file    [place] 04/02/2020, 1:21 PM

## 2020-04-02 NOTE — Progress Notes (Signed)
Spoke with case management-then with patient's husband-plans are for discharge [facility] with [facility].  Unfortunately due to insurance/financial issues-patient cannot be placed at SNF.  Patient's husband wants to take her home with [facility].  Per case [id number] will talked with social work-okay to discharge-no safety issues-APS report placed yesterday.

## 2020-04-02 NOTE — Progress Notes (Signed)
We will ask                        PROGRESS NOTE        PATIENT DETAILS Name: Amanda [name] Age: 79 y.o. Sex: female Date of Birth: 1941/02/15 Admit Date: 03/28/2020 Admitting Physician Eugenie Filler, MD VPX:[id number], Tommi Rumps, NP  Brief Narrative: Patient is a 79 y.o. 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  See below for further details.  Significant events: 6/11>> admit to [facility] for evaluation of severe weakness-found to have severe hypoglycemia. 6/12>> no diarrhea-no BM since being in the hospital.  Significant studies: 6/11>> chest x-ray: No pneumonia/edema.  Antimicrobial therapy: None  Microbiology data: 6/11>> blood cultures: Negative  6/11>> urine culture:Klebsiella Ornithinolytica  Procedures : None  Consults: None  DVT Prophylaxis : SCDs-given history of upper GI bleeding  Subjective: Lying comfortably in bed.No SOB or Chest pain  Assessment/Plan: Severe hypoglycemia: In the setting of no oral intake and ongoing insulin therapy on the day of admission.  Unfortunately did not follow-up with primary endocrinologist for the past few months.  Suspect was all ready having hypoglycemia at home-as spouse decreased U 500 dosing to daily dosing.  CBGs currently stable without long-acting insulin.  No longer on D10 infusion.    Insulin-dependent DM-2 (A1c 5.4 on 5/46) with complications (diabetic retinopathy, CAD/CVA): Initially all insulin products were held due to severe hypoglycemia-CBGs currently stable with just SSI.  Had spoken with Dr. Letta Median 6/12 over the phone-recommendations were for Lantus (15-25 units) and SSI on discharge.  However CBGs are stable with SSI-patient likely will not require long-acting insulin at this point.   Recent Labs     04/01/20 1220 04/01/20 1946 04/02/20 0406  GLUCAP 108* 102* 85   Leukocytosis: Downtrending-likely from stress margination-no obvious sources of infection apparent.  Although has urine culture positive-she has no symptoms of UTI-likely reflects asymptomatic bacteriuria and does not require treatment (apparently has history of C. difficile as well).  Blood cultures remain negative.  Chest x-ray without pneumonia.  Diarrhea has resolved-unlikely to have C. difficile.  Since WBC downtrending on its own-patient very stable-continue to monitor off antimicrobial therapy  Appears to have some amount of chronic leukocytosis at baseline.  Generalized weakness: Suspect secondary to hypoglycemia-but appears to have some amount of chronic debility at baseline-PT/OT eval appreciated-recommendations are for SNF.  Hypokalemia: Repleted.  Hypomagnesemia: Repleted  History of liver cirrhosis: Relatively well compensated-continue rifaximin-Lasix/spironolactone. Minimally elevated ammonia levels but no signs of encephalopathy.  Patient stopped taking lactulose almost 3 weeks back because of diarrhea-we will follow closely.  CAD: No anginal symptoms-continue aspirin-Per H&P-Plavix discontinued in March 2021.  HTN: BP controlled-had orthostatic hypotension on 6/14-amlodipine discontinued-orthostatic vital signs negative on 6/15.  Continue Lasix/Aldactone cautiously-follow.  CKD stage IIIa: Creatinine close to baseline-follow.  History of portal vein thrombosis: Previously on anticoagulation-but discontinued due to GI bleeding.  Recent history of upper GI bleeding due to AVMs: Hemoglobin stable-no evidence of melena/hematochezia.  History of polycythemia vera/JAK2 mutation: Follows with Dr. Aundra Millet stable.  OSA-CPAP nightly  Obesity: Estimated body mass index is 32.04 kg/m as calculated from the following:   Height as of this encounter: 5' 4.5" (1.638 m).   Weight as of this encounter: 86  kg.    Diet: Diet Order  Diet Heart Room service appropriate? Yes with Assist; Fluid consistency: Thin  Diet effective now                  Code Status:  DNR  Family Communication: Spouse at bedside on 6/13  Disposition Plan: Status is: Inpatient  Remains inpatient appropriate because:Inpatient level of care appropriate due to severity of illness  Dispo: The patient is from: Home              Anticipated d/c is to: SNF              Anticipated d/c date is: 2 days              Patient currently is medically stable to d/c.   Barriers to Discharge: Unsafe disposition (see detailed case management note on 6/14)-PT/OT recommending SNF-awaiting SNF bed   Antimicrobial agents: Anti-infectives (From admission, onward)   Start     Dose/Rate Route Frequency Ordered Stop   03/28/20 2200  rifaximin (XIFAXAN) tablet 550 mg     Discontinue     550 mg Oral 2 times daily 03/28/20 1837         Time spent: 15 minutes-Greater than 50% of this time was spent in counseling, explanation of diagnosis, planning of further management, and coordination of care.  MEDICATIONS: Scheduled Meds: . Chlorhexidine Gluconate Cloth  6 each Topical Daily  . citalopram  10 mg Oral Daily  . clopidogrel  75 mg Oral Daily  . ezetimibe  10 mg Oral Daily  . furosemide  20 mg Oral Daily  . insulin aspart  0-6 Units Subcutaneous Q4H  . latanoprost  1 drop Both Eyes QHS  . mirtazapine  15 mg Oral QHS  . mupirocin ointment  1 application Nasal BID  . pantoprazole  40 mg Oral BID AC  . rifaximin  550 mg Oral BID  . ruxolitinib phosphate  25 mg Oral BID  . sodium chloride flush  3 mL Intravenous Q12H  . spironolactone  12.5 mg Oral Daily  . timolol  1 drop Both Eyes Daily   Continuous Infusions:  PRN Meds:.acetaminophen **OR** acetaminophen, metoprolol tartrate, ondansetron **OR** ondansetron (ZOFRAN) IV, triamcinolone cream   PHYSICAL EXAM: Vital signs: Vitals:   04/01/20 2005  04/02/20 0000 04/02/20 0400 04/02/20 0800  BP: 128/65 (!) 122/56 (!) 115/54 123/64  Pulse: 81 72 75 69  Resp: 17 17 13 13   Temp: 97.9 F (36.6 C) 97.9 F (36.6 C) 97.7 F (36.5 C) 97.7 F (36.5 C)  TempSrc: Oral Oral Oral Oral  SpO2: 98% 97% 100% 99%  Weight:      Height:       Filed Weights   03/28/20 1527 03/28/20 2100 03/31/20 0400  Weight: 87.1 kg 85.7 kg 86 kg   Body mass index is 32.04 kg/m.   Gen Exam:Alert awake-not in any distress HEENT:atraumatic, normocephalic Chest: B/L clear to auscultation anteriorly CVS:S1S2 regular Abdomen:soft non tender, non distended Extremities:no edema Neurology: Non focal Skin: no rash  I have personally reviewed following labs and imaging studies  LABORATORY DATA: CBC: Recent Labs  Lab 03/28/20 1527 03/29/20 0016 03/30/20 0515 04/02/20 0406  WBC 39.8* 17.6* 15.1* 11.3*  NEUTROABS 33.5*  --   --   --   HGB 13.4 11.6* 10.6* 9.6*  HCT 42.2 36.0 33.7* 30.9*  MCV 87.7 87.4 89.6 90.1  PLT 487* 210 193 448    Basic Metabolic Panel: Recent Labs  Lab 03/28/20 1934 03/29/20 0016 03/29/20 0804 03/30/20  0515 04/02/20 0406  NA 135 130* 132* 134* 138  K 2.7* 3.1* 3.6 4.0 3.7  CL 101 100 100 104 107  CO2 23 20* 22 22 21*  GLUCOSE 49* 150* 104* 104* 92  BUN 18 17 16 14 17   CREATININE 1.59* 1.41* 1.43* 1.39* 1.36*  CALCIUM 8.2* 7.6* 7.9* 8.1* 8.2*  MG 1.3*  --  1.3* 2.3  --     GFR: Estimated Creatinine Clearance: 35.4 mL/min (A) (by C-G formula based on SCr of 1.36 mg/dL (H)).  Liver Function Tests: Recent Labs  Lab 03/28/20 1527 03/30/20 0515  AST 27 27  ALT 8 8  ALKPHOS 130* 108  BILITOT 2.5* 2.2*  PROT 6.2* 4.9*  ALBUMIN 2.9* 2.4*   No results for input(s): LIPASE, AMYLASE in the last 168 hours. Recent Labs  Lab 03/28/20 1527  AMMONIA 62*    Coagulation Profile: No results for input(s): INR, PROTIME in the last 168 hours.  Cardiac Enzymes: No results for input(s): CKTOTAL, CKMB, CKMBINDEX,  TROPONINI in the last 168 hours.  BNP (last 3 results) No results for input(s): PROBNP in the last 8760 hours.  Lipid Profile: No results for input(s): CHOL, HDL, LDLCALC, TRIG, CHOLHDL, LDLDIRECT in the last 72 hours.  Thyroid Function Tests: No results for input(s): TSH, T4TOTAL, FREET4, T3FREE, THYROIDAB in the last 72 hours.  Anemia Panel: No results for input(s): VITAMINB12, FOLATE, FERRITIN, TIBC, IRON, RETICCTPCT in the last 72 hours.  Urine analysis:    Component Value Date/Time   COLORURINE AMBER (A) 03/28/2020 2005   APPEARANCEUR TURBID (A) 03/28/2020 2005   LABSPEC 1.011 03/28/2020 2005   PHURINE 5.0 03/28/2020 2005   GLUCOSEU NEGATIVE 03/28/2020 2005   GLUCOSEU NEGATIVE 01/10/2018 1217   HGBUR MODERATE (A) 03/28/2020 2005   HGBUR large 05/07/2010 0845   BILIRUBINUR NEGATIVE 03/28/2020 2005   BILIRUBINUR neg 01/26/2016 1003   KETONESUR NEGATIVE 03/28/2020 2005   PROTEINUR 30 (A) 03/28/2020 2005   UROBILINOGEN 0.2 01/10/2018 1217   NITRITE NEGATIVE 03/28/2020 2005   LEUKOCYTESUR MODERATE (A) 03/28/2020 2005    Sepsis Labs: Lactic Acid, Venous    Component Value Date/Time   LATICACIDVEN 2.3 ([place]) 01/13/2020 0055    MICROBIOLOGY: Recent Results (from the past 240 hour(s))  SARS Coronavirus 2 by RT PCR (hospital order, performed in [place] hospital lab) Nasopharyngeal Nasopharyngeal Swab     Status: None   Collection Time: 03/28/20  5:12 PM   Specimen: Nasopharyngeal Swab  Result Value Ref Range Status   SARS Coronavirus 2 NEGATIVE NEGATIVE Final    Comment: (NOTE) SARS-CoV-2 target nucleic acids are NOT DETECTED.  The SARS-CoV-2 RNA is generally detectable in upper and lower respiratory specimens during the acute phase of infection. The lowest concentration of SARS-CoV-2 viral copies this assay can detect is 250 copies / mL. A negative result does not preclude SARS-CoV-2 infection and should not be used as the sole basis for treatment or  other patient management decisions.  A negative result may occur with improper specimen collection / handling, submission of specimen other than nasopharyngeal swab, presence of viral mutation(s) within the areas targeted by this assay, and inadequate number of viral copies (<250 copies / mL). A negative result must be combined with clinical observations, patient history, and epidemiological information.  Fact Sheet for Patients:   StrictlyIdeas.no  Fact Sheet for Healthcare Providers: BankingDealers.co.za  This test is not yet approved or  cleared by the [place] FDA and has been authorized for detection and/or  diagnosis of SARS-CoV-2 by FDA under an Emergency Use Authorization (EUA).  This EUA will remain in effect (meaning this test can be used) for the duration of the COVID-19 declaration under Section 564(b)(1) of the Act, 21 U.S.C. section 360bbb-3(b)(1), unless the authorization is terminated or revoked sooner.  Performed at [facility], [place] [address] [place], [place] [address]   Blood culture (routine x 2)     Status: None (Preliminary result)   Collection Time: 03/28/20  7:34 PM   Specimen: BLOOD  Result Value Ref Range Status   Specimen Description BLOOD RIGHT ANTECUBITAL  Final   Special Requests   Final    BOTTLES DRAWN AEROBIC ONLY Blood Culture adequate volume   Culture   Final    NO GROWTH 4 DAYS Performed at [facility], [place] [address], [place], [place] [address]    Report Status PENDING  Incomplete  Blood culture (routine x 2)     Status: None (Preliminary result)   Collection Time: 03/28/20  7:41 PM   Specimen: BLOOD RIGHT ARM  Result Value Ref Range Status   Specimen Description BLOOD RIGHT ARM  Final   Special Requests   Final    BOTTLES DRAWN AEROBIC AND ANAEROBIC Blood Culture adequate volume   Culture   Final    NO GROWTH 4 DAYS Performed at [facility], [place] [address],  [place], [place] [address]    Report Status PENDING  Incomplete  Urine Culture     Status: Abnormal   Collection Time: 03/28/20  8:19 PM   Specimen: Urine, Catheterized  Result Value Ref Range Status   Specimen Description URINE, CATHETERIZED  Final   Special Requests   Final    NONE Performed at [facility], [place] [address] [place],  [address]    Culture >=100,000 COLONIES/mL KLEBSIELLA ORNITHINOLYTICA (A)  Final   Report Status 03/30/2020 FINAL  Final   Organism ID, Bacteria KLEBSIELLA ORNITHINOLYTICA (A)  Final      Susceptibility   Klebsiella ornithinolytica - MIC*    AMPICILLIN RESISTANT Resistant     CEFAZOLIN <=4 SENSITIVE Sensitive     CEFTRIAXONE <=1 SENSITIVE Sensitive     CIPROFLOXACIN <=0.25 SENSITIVE Sensitive     GENTAMICIN <=1 SENSITIVE Sensitive     IMIPENEM <=0.25 SENSITIVE Sensitive     NITROFURANTOIN 64 INTERMEDIATE Intermediate     TRIMETH/SULFA <=20 SENSITIVE Sensitive     AMPICILLIN/SULBACTAM 4 SENSITIVE Sensitive     PIP/TAZO <=4 SENSITIVE Sensitive     * >=100,000 COLONIES/mL KLEBSIELLA ORNITHINOLYTICA  MRSA PCR Screening     Status: Abnormal   Collection Time: 03/28/20 11:03 PM   Specimen: Nasal Mucosa; Nasopharyngeal  Result Value Ref Range Status   MRSA by PCR POSITIVE (A) NEGATIVE Final    Comment:        The GeneXpert MRSA Assay (FDA approved for NASAL specimens only), is one component of a comprehensive MRSA colonization surveillance program. It is not intended to diagnose MRSA infection nor to guide or monitor treatment for MRSA infections. RESULT CALLED TO, READ BACK BY AND VERIFIED WITH: Q LOFTON TN 03/29/20 0034 JDW Performed at [facility], [place] [address], [place],  [address]     RADIOLOGY STUDIES/RESULTS: No results found.   LOS: 5 days   Oren Binet, MD  [facility]    To contact the attending provider between 7A-7P or the covering provider during after hours 7P-7A, please log into the  web  site [email or web address] and access using universal [place] password for that web site. If you do not have the password, please call the hospital operator.  04/02/2020, 9:23 AM

## 2020-04-03 ENCOUNTER — Other Ambulatory Visit: Payer: Self-pay

## 2020-04-03 ENCOUNTER — Telehealth: Payer: Self-pay | Admitting: Adult Health

## 2020-04-03 ENCOUNTER — Telehealth: Payer: Self-pay | Admitting: *Deleted

## 2020-04-03 ENCOUNTER — Ambulatory Visit: Payer: Medicare HMO | Admitting: Physician Assistant

## 2020-04-03 LAB — GLUCOSE, CAPILLARY
Glucose-Capillary: 112 mg/dL — ABNORMAL HIGH (ref 70–99)
Glucose-Capillary: 115 mg/dL — ABNORMAL HIGH (ref 70–99)
Glucose-Capillary: 125 mg/dL — ABNORMAL HIGH (ref 70–99)
Glucose-Capillary: 138 mg/dL — ABNORMAL HIGH (ref 70–99)
Glucose-Capillary: 179 mg/dL — ABNORMAL HIGH (ref 70–99)
Glucose-Capillary: 199 mg/dL — ABNORMAL HIGH (ref 70–99)
Glucose-Capillary: 91 mg/dL (ref 70–99)
Glucose-Capillary: 94 mg/dL (ref 70–99)

## 2020-04-03 NOTE — Telephone Encounter (Signed)
I did not order a CT scan

## 2020-04-03 NOTE — Telephone Encounter (Signed)
Spoke to the pt.  He does not need a CT scan for his wife.  See other message from 04/03/20.

## 2020-04-03 NOTE — Telephone Encounter (Signed)
The patient was just in the hospital and they took her off of clopidogrel (PLAVIX) 75 MG tablet  He has her Rx bottle but don't see it on the list for her to take any longer.  Please advise

## 2020-04-03 NOTE — Telephone Encounter (Signed)
Patient husband called the after hours line verbalizing he is needing an authorization for CT scan

## 2020-04-03 NOTE — Progress Notes (Signed)
Notified by department 5W that patient had been discharge home yesterday without getting her portacath deaccessed. NCM spoke wth liaison at [facility] who will reach out to the assigned nurse to get orders from PCP to deaccess port.   Manya Silvas, RN MSN CCM Transitions of Care [phone]

## 2020-04-03 NOTE — Telephone Encounter (Signed)
Spoke to Amanda [name] and advised that I will need to send an order for the sit to stand lift.  He has decided to use [facility] and will call me back with a fax #.

## 2020-04-03 NOTE — Telephone Encounter (Signed)
Cory,  I found the below documentation in the discharge summary.  History of portal vein thrombosis: Previously on anticoagulation-but discontinued due to GI bleeding.

## 2020-04-03 NOTE — Telephone Encounter (Signed)
Spoke to Mr. Ocon and advised that the pt is not started back on Plavix.  Nothing further needed.

## 2020-04-03 NOTE — Telephone Encounter (Signed)
Pt husband called requesting a sit to stand lift so he can help patient get out of bed and chair.The husband is alone with no help with his wife.

## 2020-04-03 NOTE — Telephone Encounter (Signed)
Transition Care Management Follow-up Telephone Call   Date discharged? 04/02/2020   How have you been since you were released from the hospital? Per husband "She has been laying in bed"     Do you understand why you were in the hospital? yes   Do you understand the discharge instructions? yes   Where were you discharged to? Home    Items Reviewed:  Medications reviewed: yes  Allergies reviewed: yes  Dietary changes reviewed: N/A  Referrals reviewed: N/A   Functional Questionnaire:   Activities of Daily Living (ADLs):   She states they are independent in the following: N/A  States they require assistance with the following: ambulation, bathing and hygiene, feeding, continence, grooming, toileting and dressing   Any transportation issues/concerns?: no   Any patient concerns? Yes. Husband is requesting a request be sent to [facility] for a Sit and stand lift (DME)    Confirmed importance and date/time of follow-up visits scheduled yes  Provider Appointment booked with [name], NP 04/15/2020   Confirmed with patient if condition begins to worsen call PCP or go to the ER.  Patient was given the office number and encouraged to call back with question or concerns.  : yes

## 2020-04-03 NOTE — Patient Outreach (Signed)
[facility]) Care Management  04/03/2020  Amanda [name] Apr 20, 1941 [id number]     Transition of Care Referral  Referral Date: 04/03/2020 Referral Source: Hospital Liaison Date of Discharge: 04/02/2020 Facility: [place]: [facility] Medicare PCP Office Does [facility]   Outreach attempt # 1 to patient. Spoke with patient. She reports she is doing fine. She did not wish to engage in conversation and just kept stating she was fine and did not need anything. RN CM spoke with spouse. He reports he has all of patient's meds. He voices he has called both MD offices to make follow up appt and is awaiting to hear back. She is unsure if patient will be able to go by personal transportation.he states tat they also se [employer] transportation agency at times and can call them if needed. Spouse did not wish to continue call and reported he would call back if he needed anything.     Plan: RN CM will close case at this time.    Enzo Montgomery, RN,BSN,CCM [facility] Telephonic Care Management Coordinator Direct Phone: [phone] Toll Free: [phone] Fax: [phone]

## 2020-04-03 NOTE — Telephone Encounter (Signed)
Message received from patient's husband stating that pt is not able to swallow Jakafi and would like to know if pt can stop Jakafi at this time.  Dr. [name] notified.  Call placed back to patient's husband and notified him per order of Dr. [name] that patient can hold Jakafi at this time.  Pt.'s husband appreciative of call back and has no further questions at this time.

## 2020-04-03 NOTE — Telephone Encounter (Signed)
Correct, from what I gather from her hospital notes she should not be on any anticoagulation at this time.  As for the left, and you have to check with home health to see what is covered by Medicare.  Once I get an answer I will follow-up.

## 2020-04-04 ENCOUNTER — Telehealth: Payer: Self-pay | Admitting: Nurse Practitioner

## 2020-04-04 ENCOUNTER — Telehealth: Payer: Self-pay | Admitting: Adult Health

## 2020-04-04 ENCOUNTER — Other Ambulatory Visit: Payer: Self-pay | Admitting: Adult Health

## 2020-04-04 ENCOUNTER — Telehealth: Payer: Self-pay | Admitting: Internal Medicine

## 2020-04-04 ENCOUNTER — Other Ambulatory Visit: Payer: Self-pay | Admitting: *Deleted

## 2020-04-04 DIAGNOSIS — I5032 Chronic diastolic (congestive) heart failure: Secondary | ICD-10-CM | POA: Diagnosis not present

## 2020-04-04 DIAGNOSIS — R627 Adult failure to thrive: Secondary | ICD-10-CM | POA: Diagnosis not present

## 2020-04-04 DIAGNOSIS — I2510 Atherosclerotic heart disease of native coronary artery without angina pectoris: Secondary | ICD-10-CM | POA: Diagnosis not present

## 2020-04-04 DIAGNOSIS — I81 Portal vein thrombosis: Secondary | ICD-10-CM | POA: Diagnosis not present

## 2020-04-04 DIAGNOSIS — N183 Chronic kidney disease, stage 3 unspecified: Secondary | ICD-10-CM | POA: Diagnosis not present

## 2020-04-04 DIAGNOSIS — I130 Hypertensive heart and chronic kidney disease with heart failure and stage 1 through stage 4 chronic kidney disease, or unspecified chronic kidney disease: Secondary | ICD-10-CM | POA: Diagnosis not present

## 2020-04-04 DIAGNOSIS — K5521 Angiodysplasia of colon with hemorrhage: Secondary | ICD-10-CM | POA: Diagnosis not present

## 2020-04-04 DIAGNOSIS — K7460 Unspecified cirrhosis of liver: Secondary | ICD-10-CM | POA: Diagnosis not present

## 2020-04-04 DIAGNOSIS — E1122 Type 2 diabetes mellitus with diabetic chronic kidney disease: Secondary | ICD-10-CM | POA: Diagnosis not present

## 2020-04-04 MED ORDER — MECLIZINE HCL 25 MG PO TABS: 25.0000 mg | ORAL_TABLET | Freq: Three times a day (TID) | ORAL | 0 refills | Status: DC | PRN

## 2020-04-04 NOTE — Telephone Encounter (Signed)
Spoke to Amanda [name] and advised him to pick up rx for dizziness.  He agreed.  Nothing further needed.

## 2020-04-04 NOTE — Telephone Encounter (Signed)
Spoke to Amanda [name] and informed her that I have removed Jakafi from the patients med list.  Advised that gastroenterology prescribed the Xifaxan and they would be the ones to make any changes.  Langley Gauss told me that Amanda [name] has called over to get the medication changed.  Nothing further needed.

## 2020-04-04 NOTE — Telephone Encounter (Signed)
Per Beryle Beams [facility]ada  Need order for the patient to flush  Her port please call [phone]

## 2020-04-04 NOTE — Telephone Encounter (Signed)
[facility] for order

## 2020-04-04 NOTE — Telephone Encounter (Signed)
Notified [name] to proceed with orders PT.  Would you like to prescribe meclizine for the vertigo?

## 2020-04-04 NOTE — Telephone Encounter (Signed)
Patients husband [name] requests to be called at ph# [phone] re: medication changes that were made while patient was in the hospital and a request for a non stick meter.

## 2020-04-04 NOTE — Telephone Encounter (Signed)
Denise from [facility] called to let Tommi Rumps know that Dr. Jonette Eva took this patient off of ruxolitinib phosphate (JAKAFI) 25 MG tablet  The patient was also prescribed XIFAXAN 550 MG TABS tablet but it is still sitting at the pharmacy because the patient can not afford $140 for this Rx. The patient was wondering if you can call in the name brand of this medication or if there an alternative that is cheaper.  Also Langley Gauss wanted to know if Tommi Rumps will sign her home care orders.  Please advise

## 2020-04-04 NOTE — Telephone Encounter (Signed)
Returned husband's call. His question or concern was not about pt current medication regimen or changes that had been made while at the [facility] 03/28/20, in fact he stated he had no concerns at this time. However, husband merely wanted to inform Dr. Cruzita Lederer that given the new sliding scale orders and the fact that the pt is now checking CBG's more than 4 times per day, pt would now qualify for a CGM. Advised that pt will likely need a hosp follow up appt to review the changes that had been made. In addition, advised that we will need approval from Dr. Cruzita Lederer to proceed with orders for a CGM should her regimen remain unchanged from hosp discharge. Dr. Arman Filter is out of the office until 6/23 and it appears she does not have an opening until late July. Advised, since pt is stable, I would forward this message along to Dr. Cruzita Lederer for her to review and address upon her return. Verbalized acceptance and understanding. Husband also added pt is weak and he is not able to transport her at this time. Husband asked that if a visit is needed, that it be a virtual visit if possible.

## 2020-04-04 NOTE — Telephone Encounter (Signed)
Pt stated that he had received a call from [employer] [employer] and does not understand what they said.  He stated that his medication is $140 and that he was advised that he can apply for a grant through the manufacture.

## 2020-04-04 NOTE — Telephone Encounter (Signed)
-  pt was evaluated today- recommendation 1 time a week for 6 weeks- functional mobility.  Pt has high anxiety resulting in minimal participation. Pt reports chronic dizziness that sounds like vertigo.  May benefit from Meclizine.  Call [facility] rep back with verbal orders for PT.

## 2020-04-04 NOTE — Telephone Encounter (Signed)
Spoke to [place] and advised him to proceed with order.  Nothing further needed.

## 2020-04-04 NOTE — Telephone Encounter (Signed)
Spoke with Mr Prosser.  [facility] gave me information on a possible source to assist with the patient's needs.  [email or web address]  Forwarded this information to him via the patient message in her My Chart. Mr Benton will look at this and let me know how I can help.

## 2020-04-04 NOTE — Patient Outreach (Signed)
[facility]) Care Management  04/04/2020  Amanda [name] 1941/01/26 [id number]    CSW received referral for post hospital follow up on 04/02/2020.  CSW made initial contact with pt's husband who reports he is the primary caregiver and that pt has been home for 3 days. Per husband, pt was hospitalized, released to SNF  and then to home where she was home for about 1.5 weeks before having to return to hospital (after PCP visit).   Pt is supposed to be going to [employer] [facility] therapy, per husband.  Per husband, they cannot afford to pay for private care- he is awaiting word from [facility] on some assistance they may offer (transportation, etc).  [name] also shared that they have a daughter who lives nearby who may be able to assist with in home care (mornings) pending her job schedule.  He has obtained all DME needed at this time however is also researching a "sit to stand" device. Per husband, DSS was involved for possible Medicaid but was told "I screwed it up when I took her home from the facility".  Per chart notes, APS was also involved and the referral was "Screened out" for no further action.  This CSW has also confirmed this with APS.    CSW provided pt's husband with contact #'s for community based services that may be of help; including [employer] to which he plans to call.    CSW will plan to follow up for updates on pt's progress/status and further needs.    Eduard Clos, MSW, [place] Worker  [place] [phone]

## 2020-04-04 NOTE — Telephone Encounter (Signed)
Meclizine sent in

## 2020-04-07 ENCOUNTER — Telehealth: Payer: Self-pay | Admitting: Adult Health

## 2020-04-07 ENCOUNTER — Telehealth: Payer: Self-pay | Admitting: Nurse Practitioner

## 2020-04-07 ENCOUNTER — Telehealth: Payer: Self-pay | Admitting: General Surgery

## 2020-04-07 DIAGNOSIS — I130 Hypertensive heart and chronic kidney disease with heart failure and stage 1 through stage 4 chronic kidney disease, or unspecified chronic kidney disease: Secondary | ICD-10-CM | POA: Diagnosis not present

## 2020-04-07 DIAGNOSIS — I2510 Atherosclerotic heart disease of native coronary artery without angina pectoris: Secondary | ICD-10-CM | POA: Diagnosis not present

## 2020-04-07 DIAGNOSIS — E1122 Type 2 diabetes mellitus with diabetic chronic kidney disease: Secondary | ICD-10-CM | POA: Diagnosis not present

## 2020-04-07 DIAGNOSIS — K7460 Unspecified cirrhosis of liver: Secondary | ICD-10-CM | POA: Diagnosis not present

## 2020-04-07 DIAGNOSIS — K5521 Angiodysplasia of colon with hemorrhage: Secondary | ICD-10-CM | POA: Diagnosis not present

## 2020-04-07 DIAGNOSIS — I5032 Chronic diastolic (congestive) heart failure: Secondary | ICD-10-CM | POA: Diagnosis not present

## 2020-04-07 DIAGNOSIS — N183 Chronic kidney disease, stage 3 unspecified: Secondary | ICD-10-CM | POA: Diagnosis not present

## 2020-04-07 DIAGNOSIS — R627 Adult failure to thrive: Secondary | ICD-10-CM | POA: Diagnosis not present

## 2020-04-07 DIAGNOSIS — I81 Portal vein thrombosis: Secondary | ICD-10-CM | POA: Diagnosis not present

## 2020-04-07 MED ORDER — LANTUS SOLOSTAR 100 UNIT/ML ~~LOC~~ SOPN
16.0000 [IU] | PEN_INJECTOR | Freq: Every day | SUBCUTANEOUS | 1 refills | Status: DC
Start: 2020-04-07 — End: 2020-05-08

## 2020-04-07 MED ORDER — INSULIN PEN NEEDLE 32G X 4 MM MISC: 1.0000 | Freq: Four times a day (QID) | 0 refills | Status: AC

## 2020-04-07 NOTE — Telephone Encounter (Signed)
Please advise in dr. Ena Dawley absence

## 2020-04-07 NOTE — Telephone Encounter (Signed)
Received notification from [facility] that Xifaxin was to costly for the patient. Contacted the patient and spoke with her husband, they would like to seek treatment but are asking for something more cost effective. Expressed to him that Jaclyn Shaggy, NP will return to the clinic tomorrow and they will be contacted regarding this.

## 2020-04-07 NOTE — Telephone Encounter (Signed)
Per Patients husband [name], she is unable to get her up.  She can not get up for a different reason not DM.  He wants to just ask some questions. Please advise thank you

## 2020-04-07 NOTE — Telephone Encounter (Signed)
Patients spouse called to advise that patients blood sugars are between 350 and 650 since being in the hospital where her insulin regimen was changed.  Spouse would like to know if he can put her back on the Trulicity and/or how can patient be transitioned back to insulin regimen as prescribed by Dr Cruzita Lederer.  Call back number is [phone]

## 2020-04-07 NOTE — Telephone Encounter (Signed)
Patient husband [name] notified of adding the lantus and is ok with that.  He states he understands to not start the other insulins.   Do you want me to send in the RX or do you want to send it in?

## 2020-04-07 NOTE — Telephone Encounter (Signed)
Husband is requesting a call concerning his wife before their online meeting.

## 2020-04-08 ENCOUNTER — Other Ambulatory Visit: Payer: Self-pay

## 2020-04-08 ENCOUNTER — Telehealth: Payer: Self-pay | Admitting: Adult Health

## 2020-04-08 DIAGNOSIS — I5032 Chronic diastolic (congestive) heart failure: Secondary | ICD-10-CM | POA: Diagnosis not present

## 2020-04-08 DIAGNOSIS — N183 Chronic kidney disease, stage 3 unspecified: Secondary | ICD-10-CM | POA: Diagnosis not present

## 2020-04-08 DIAGNOSIS — K7460 Unspecified cirrhosis of liver: Secondary | ICD-10-CM | POA: Diagnosis not present

## 2020-04-08 DIAGNOSIS — E1122 Type 2 diabetes mellitus with diabetic chronic kidney disease: Secondary | ICD-10-CM | POA: Diagnosis not present

## 2020-04-08 DIAGNOSIS — K5521 Angiodysplasia of colon with hemorrhage: Secondary | ICD-10-CM | POA: Diagnosis not present

## 2020-04-08 DIAGNOSIS — R627 Adult failure to thrive: Secondary | ICD-10-CM | POA: Diagnosis not present

## 2020-04-08 DIAGNOSIS — I130 Hypertensive heart and chronic kidney disease with heart failure and stage 1 through stage 4 chronic kidney disease, or unspecified chronic kidney disease: Secondary | ICD-10-CM | POA: Diagnosis not present

## 2020-04-08 DIAGNOSIS — I81 Portal vein thrombosis: Secondary | ICD-10-CM | POA: Diagnosis not present

## 2020-04-08 DIAGNOSIS — I2510 Atherosclerotic heart disease of native coronary artery without angina pectoris: Secondary | ICD-10-CM | POA: Diagnosis not present

## 2020-04-08 MED ORDER — XIFAXAN 550 MG PO TABS: 550.0000 mg | ORAL_TABLET | Freq: Two times a day (BID) | ORAL | 3 refills | Status: DC

## 2020-04-08 MED ORDER — XIFAXAN 550 MG PO TABS: 550.0000 mg | ORAL_TABLET | Freq: Two times a day (BID) | ORAL | 3 refills | Status: AC

## 2020-04-08 NOTE — Telephone Encounter (Signed)
Assisting with the application process. [email or web address] Mr Soderholm will come in to the office to pick up the application and complete the patient part. Dr [name] has signed.

## 2020-04-08 NOTE — Telephone Encounter (Signed)
Amanda [name] I am helping her apply for patient assistance for Xifaxan.  Mr Trawick is coming by today to pick up the application I have filled out for her.

## 2020-04-09 DIAGNOSIS — R627 Adult failure to thrive: Secondary | ICD-10-CM | POA: Diagnosis not present

## 2020-04-09 DIAGNOSIS — I81 Portal vein thrombosis: Secondary | ICD-10-CM | POA: Diagnosis not present

## 2020-04-09 DIAGNOSIS — K7460 Unspecified cirrhosis of liver: Secondary | ICD-10-CM | POA: Diagnosis not present

## 2020-04-09 DIAGNOSIS — I130 Hypertensive heart and chronic kidney disease with heart failure and stage 1 through stage 4 chronic kidney disease, or unspecified chronic kidney disease: Secondary | ICD-10-CM | POA: Diagnosis not present

## 2020-04-09 DIAGNOSIS — I5032 Chronic diastolic (congestive) heart failure: Secondary | ICD-10-CM | POA: Diagnosis not present

## 2020-04-09 DIAGNOSIS — N183 Chronic kidney disease, stage 3 unspecified: Secondary | ICD-10-CM | POA: Diagnosis not present

## 2020-04-09 DIAGNOSIS — E1122 Type 2 diabetes mellitus with diabetic chronic kidney disease: Secondary | ICD-10-CM | POA: Diagnosis not present

## 2020-04-09 DIAGNOSIS — I2510 Atherosclerotic heart disease of native coronary artery without angina pectoris: Secondary | ICD-10-CM | POA: Diagnosis not present

## 2020-04-09 DIAGNOSIS — K5521 Angiodysplasia of colon with hemorrhage: Secondary | ICD-10-CM | POA: Diagnosis not present

## 2020-04-09 NOTE — Telephone Encounter (Signed)
M, can you give her a call and see how her sugars are doing and which diabetic regimen she is taking?

## 2020-04-09 NOTE — Telephone Encounter (Signed)
Spoke to Amanda [name].  He needs an order for a lightweight wheel chair sent to [employer].  (P) [phone].  Will need to call and get fax #.

## 2020-04-10 ENCOUNTER — Other Ambulatory Visit: Payer: Self-pay

## 2020-04-10 ENCOUNTER — Telehealth: Payer: Self-pay | Admitting: *Deleted

## 2020-04-10 ENCOUNTER — Encounter: Payer: Self-pay | Admitting: Internal Medicine

## 2020-04-10 ENCOUNTER — Telehealth: Payer: Self-pay | Admitting: Internal Medicine

## 2020-04-10 NOTE — Telephone Encounter (Signed)
[facility] for order

## 2020-04-10 NOTE — Telephone Encounter (Signed)
See previous phone note regarding this.

## 2020-04-10 NOTE — Telephone Encounter (Signed)
Patient's husband [name] requests to be called at ph# [phone] re: Patient was in the hospital and her medication was changed. Now, per [name]-patient's blood sugars are all over the place. Patient's lowest blood sugars= 113-patient's highest blood sugars =305.

## 2020-04-10 NOTE — Telephone Encounter (Signed)
Amanda [name] (physical therapist) from [place] recommends a hospital bed with a trapeze and half bed rail. Also, increase frequent of PT to 2x a week starting next week through the week of 05/05/20. Patient's anxiety is the largest impairment to progress. Call back number is [phone].

## 2020-04-10 NOTE — Telephone Encounter (Signed)
Shan Levans 2 minutes ago (8:25 AM)  [place]   Patient's husband [name] requests to be called at ph# [phone] re: Patient was in the hospital and her medication was changed. Now, per [name]-patient's blood sugars are all over the place. Patient's lowest blood sugars= 113-patient's highest blood sugars =305.      Spoke to patient husband and asked him if he has access to her MyChart, he said he does. I asked him if he could take a picture of the recent reading and send via myChart, he said he could.  I also let him know rather than calling the clinic due to how hard it is for patients to actually get through on our phones, it would be faster and easier for him to communicate with [place] Via MyChart. He expressed understanding and will be sending [place] her reading.

## 2020-04-11 ENCOUNTER — Telehealth: Payer: Self-pay | Admitting: Adult Health

## 2020-04-11 ENCOUNTER — Other Ambulatory Visit: Payer: Self-pay

## 2020-04-11 DIAGNOSIS — I130 Hypertensive heart and chronic kidney disease with heart failure and stage 1 through stage 4 chronic kidney disease, or unspecified chronic kidney disease: Secondary | ICD-10-CM | POA: Diagnosis not present

## 2020-04-11 DIAGNOSIS — K7460 Unspecified cirrhosis of liver: Secondary | ICD-10-CM | POA: Diagnosis not present

## 2020-04-11 DIAGNOSIS — I5032 Chronic diastolic (congestive) heart failure: Secondary | ICD-10-CM | POA: Diagnosis not present

## 2020-04-11 DIAGNOSIS — I2510 Atherosclerotic heart disease of native coronary artery without angina pectoris: Secondary | ICD-10-CM | POA: Diagnosis not present

## 2020-04-11 DIAGNOSIS — E1122 Type 2 diabetes mellitus with diabetic chronic kidney disease: Secondary | ICD-10-CM | POA: Diagnosis not present

## 2020-04-11 DIAGNOSIS — R627 Adult failure to thrive: Secondary | ICD-10-CM | POA: Diagnosis not present

## 2020-04-11 DIAGNOSIS — I81 Portal vein thrombosis: Secondary | ICD-10-CM | POA: Diagnosis not present

## 2020-04-11 DIAGNOSIS — K5521 Angiodysplasia of colon with hemorrhage: Secondary | ICD-10-CM | POA: Diagnosis not present

## 2020-04-11 DIAGNOSIS — N183 Chronic kidney disease, stage 3 unspecified: Secondary | ICD-10-CM | POA: Diagnosis not present

## 2020-04-11 NOTE — Telephone Encounter (Signed)
She has been denied assistance. However, I called the [employer] PAP.  An appeal has been filed on her behalf.

## 2020-04-11 NOTE — Telephone Encounter (Signed)
[place] for orders for home equipment and PT verbal orders  I am fine with increasing her celexa from 10 mg to 20 mg if the patient would like

## 2020-04-11 NOTE — Telephone Encounter (Signed)
Beth thank you providing the patient with the Xifaxan assistance program. She did not tolerate Lactulose. I would not recommend other antibiotic for HE at this time. Let me know if the Xifaxan drug rep can provide samples as she applies for the assistance program.

## 2020-04-11 NOTE — Telephone Encounter (Signed)
Pt is calling in stating that she would like to get a refill on diphenoxylate-atropine (LOMOTIL) 2.5-0.025 MG  Pharm:  [facility] on [address]

## 2020-04-13 DIAGNOSIS — N1831 Chronic kidney disease, stage 3a: Secondary | ICD-10-CM | POA: Diagnosis not present

## 2020-04-13 DIAGNOSIS — I252 Old myocardial infarction: Secondary | ICD-10-CM | POA: Diagnosis not present

## 2020-04-13 DIAGNOSIS — E11649 Type 2 diabetes mellitus with hypoglycemia without coma: Secondary | ICD-10-CM | POA: Diagnosis not present

## 2020-04-13 DIAGNOSIS — E1142 Type 2 diabetes mellitus with diabetic polyneuropathy: Secondary | ICD-10-CM | POA: Diagnosis not present

## 2020-04-13 DIAGNOSIS — I130 Hypertensive heart and chronic kidney disease with heart failure and stage 1 through stage 4 chronic kidney disease, or unspecified chronic kidney disease: Secondary | ICD-10-CM | POA: Diagnosis not present

## 2020-04-13 DIAGNOSIS — E1122 Type 2 diabetes mellitus with diabetic chronic kidney disease: Secondary | ICD-10-CM | POA: Diagnosis not present

## 2020-04-13 DIAGNOSIS — I5032 Chronic diastolic (congestive) heart failure: Secondary | ICD-10-CM | POA: Diagnosis not present

## 2020-04-13 DIAGNOSIS — I2510 Atherosclerotic heart disease of native coronary artery without angina pectoris: Secondary | ICD-10-CM | POA: Diagnosis not present

## 2020-04-13 DIAGNOSIS — M1030 Gout due to renal impairment, unspecified site: Secondary | ICD-10-CM | POA: Diagnosis not present

## 2020-04-15 ENCOUNTER — Other Ambulatory Visit: Payer: Self-pay

## 2020-04-15 ENCOUNTER — Other Ambulatory Visit: Payer: Self-pay | Admitting: Adult Health

## 2020-04-15 ENCOUNTER — Telehealth (INDEPENDENT_AMBULATORY_CARE_PROVIDER_SITE_OTHER): Payer: Medicare HMO | Admitting: Adult Health

## 2020-04-15 DIAGNOSIS — F419 Anxiety disorder, unspecified: Secondary | ICD-10-CM

## 2020-04-15 DIAGNOSIS — I5032 Chronic diastolic (congestive) heart failure: Secondary | ICD-10-CM | POA: Diagnosis not present

## 2020-04-15 DIAGNOSIS — I10 Essential (primary) hypertension: Secondary | ICD-10-CM | POA: Diagnosis not present

## 2020-04-15 DIAGNOSIS — R627 Adult failure to thrive: Secondary | ICD-10-CM | POA: Diagnosis not present

## 2020-04-15 DIAGNOSIS — E11649 Type 2 diabetes mellitus with hypoglycemia without coma: Secondary | ICD-10-CM

## 2020-04-15 DIAGNOSIS — I252 Old myocardial infarction: Secondary | ICD-10-CM | POA: Diagnosis not present

## 2020-04-15 DIAGNOSIS — M1030 Gout due to renal impairment, unspecified site: Secondary | ICD-10-CM | POA: Diagnosis not present

## 2020-04-15 DIAGNOSIS — R531 Weakness: Secondary | ICD-10-CM

## 2020-04-15 DIAGNOSIS — I2510 Atherosclerotic heart disease of native coronary artery without angina pectoris: Secondary | ICD-10-CM | POA: Diagnosis not present

## 2020-04-15 DIAGNOSIS — N1831 Chronic kidney disease, stage 3a: Secondary | ICD-10-CM | POA: Diagnosis not present

## 2020-04-15 DIAGNOSIS — E1122 Type 2 diabetes mellitus with diabetic chronic kidney disease: Secondary | ICD-10-CM | POA: Diagnosis not present

## 2020-04-15 DIAGNOSIS — E1142 Type 2 diabetes mellitus with diabetic polyneuropathy: Secondary | ICD-10-CM | POA: Diagnosis not present

## 2020-04-15 DIAGNOSIS — I130 Hypertensive heart and chronic kidney disease with heart failure and stage 1 through stage 4 chronic kidney disease, or unspecified chronic kidney disease: Secondary | ICD-10-CM | POA: Diagnosis not present

## 2020-04-15 MED ORDER — CITALOPRAM HYDROBROMIDE 20 MG PO TABS: 20.0000 mg | ORAL_TABLET | Freq: Every day | ORAL | 0 refills | Status: DC

## 2020-04-15 MED ORDER — DIPHENOXYLATE-ATROPINE 2.5-0.025 MG PO TABS
1.0000 | ORAL_TABLET | Freq: Four times a day (QID) | ORAL | 1 refills | Status: AC | PRN
Start: 2020-04-15 — End: ?

## 2020-04-15 NOTE — Progress Notes (Addendum)
Virtual Visit via Video Note  I connected with Amanda [name] on 04/15/20 at 11:00 AM EDT by a video enabled telemedicine application and verified that I am speaking with the correct person using two identifiers.  Location patient: home Location provider:work or home office Persons participating in the virtual visit: patient, provider, hudband  I discussed the limitations of evaluation and management by telemedicine and the availability of in person appointments. The patient expressed understanding and agreed to proceed.   HPI: 79 year old female who  has a past medical history of Allergic rhinitis (01/23/2016), C. difficile colitis, CAD (coronary artery disease), Cancer ([place]), Candidiasis of skin (09/30/2014), Cirrhosis ([place]), Depression, Diabetes mellitus (2008), Gout, Herpes simplex, Hyperglycemia (05/31/2013), Hyperlipidemia, Hypertension, Myocardial infarction [facility]), Neuromuscular disorder ([place]), Obesity, Polycythemia, and Psoriasis.  She is being evaluated today for TCM visit   Admit Date 03/28/2020 Discharge Date 04/02/2020  She was sent to the emergency room from endocrinology office for evaluation of weakness in which further evaluation revealed a severe hypoglycemia.  Hospital Course  Severe Hypoglycemia -the setting of no oral intake and ongoing insulin therapy on the day of admission.  CBGs were able to stabilize in the hospital with SSI.  Insulin Dependent DM II -A1c was 5.4 on 03/29/2020.  Initially all insulin products were held due to severe hypoglycemia-CBGs stable with just SSI.  Hospital team spoke with Dr. Cruzita Lederer on 612 with recommendations of Lantus 15 to 25 units and SSI on discharge.  However her CBGs were stable with SSI and it was thought the patient would not likely require long-acting insulin at this point.  Leukocytosis  -Was downtrending during hospital admission and thought likely to stress marginalization as there was no obvious sources of infection apparent.  Her  urine culture was positive but she had no symptoms of UTI likely reflected asymptomatic bacteria not required treatment.  Her blood cultures remain negative.  Chest x-ray without pneumonia.  She did have diarrhea but her C. difficile was negative and had resolved during hospitalization.  White blood cell count was downtrending on its own.  Generalized weakness -Secondary to hypoglycemia but appears to have some amount of chronic debility at baseline-PT/OT evaluation recommendation was for SNF but unfortunately due to insurance issues and financial constraints she was unable to be placed in skilled nursing facility and recommending home health with [facility].  Hypokalemia -Replenished  Hypomagnesemia  - Replenished   History of liver cirrhosis -Relatively well compensated-continue rifaximin-Lasix/spironolactone.  She had a minimally elevated ammonia levels but no signs of encephalopathy.  She did stop taking lactulose 3 weeks prior because of diarrhea  CAD - no anginal symptoms  -Continue aspirin  Hypertension -PT was controlled-had orthostatic hypotension on 6/14 -amlodipine was discontinued at this time.  Today she reports that she is "doing great".  Her husband reports that she is doing fairly well, her appetite is "so-so" but she is eating.  He is spending most of the time in bed she still feels anxious when getting out of bed so she is not walking much.  She does not feel as though Celexa 10 mg has made much improvement with her anxiety.  Physical therapy is coming to the house once a week.  In order to get out of bed she is using the bed rail and will sit at the edge of the bed but will go no further.  Reports that her blood sugars have been 130s to 140s on her sliding scale insulin and she has  been in contact with her endocrinologist.  Her blood pressure has been running in the 130s over 80s, she has not had any high blood pressures or low blood pressure readings.  The  patient and her husband would like to have a light weight wheelchair so that would be easier to get her in and out of the house and her elderly husband could easily push her as well as pack up the wheelchair into the car.  ROS: See pertinent positives and negatives per HPI.  Past Medical History:  Diagnosis Date   Allergic rhinitis 01/23/2016   C. difficile colitis    CAD (coronary artery disease)    a. 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 Elease Hashimoto- [facility]   Psoriasis     Past Surgical History:  Procedure Laterality Date   ANKLE FRACTURE SURGERY     BIOPSY  05/26/2018   Procedure: BIOPSY;  Surgeon: [name], MD;  Location[facility] ENDOSCOPY;  Service: Endoscopy;;   BIOPSY  02/19/2020   Procedure: BIOPSY;  Surgeon: Thornton Park, MD;  Location: [facility] ENDOSCOPY;  Service: Gastroenterology;;   BREAST SURGERY Left    milk duct   CATARACT EXTRACTION, BILATERAL     COLONOSCOPY     ENTEROSCOPY N/A 02/19/2020   Procedure: ENTEROSCOPY;  Surgeon: Thornton Park, MD;  Location: [place];  Service: Gastroenterology;  Laterality: N/A;   ESOPHAGOGASTRODUODENOSCOPY N/A 05/26/2018   Procedure: ESOPHAGOGASTRODUODENOSCOPY (EGD);  Surgeon: [name], MD;  Location: [facility] ENDOSCOPY;  Service: Endoscopy;  Laterality: N/A;   ESOPHAGOGASTRODUODENOSCOPY (EGD) WITH PROPOFOL N/A 01/21/2020   Procedure: ESOPHAGOGASTRODUODENOSCOPY (EGD) WITH PROPOFOL;  Surgeon: Yetta Flock, MD;  Location: [place];  Service: Gastroenterology;  Laterality: N/A;   HEMOSTASIS CLIP PLACEMENT  01/21/2020   Procedure:  HEMOSTASIS CLIP PLACEMENT;  Surgeon: Yetta Flock, MD;  Location: [place] ENDOSCOPY;  Service: Gastroenterology;;   HOT HEMOSTASIS N/A 01/21/2020   Procedure: HOT HEMOSTASIS (ARGON PLASMA COAGULATION/BICAP);  Surgeon: Yetta Flock, MD;  Location: [facility] ENDOSCOPY;  Service: Gastroenterology;  Laterality: N/A;   HOT HEMOSTASIS N/A 02/19/2020   Procedure: HOT HEMOSTASIS (ARGON PLASMA COAGULATION/BICAP);  Surgeon: Thornton Park, MD;  Location: [place];  Service: Gastroenterology;  Laterality: N/A;   IR IMAGING GUIDED PORT INSERTION  08/30/2019   IR PARACENTESIS  05/25/2018   LEFT HEART CATHETERIZATION WITH CORONARY ANGIOGRAM N/A 01/27/2015   Procedure: LEFT HEART CATHETERIZATION WITH CORONARY ANGIOGRAM;  Surgeon: [name], MD;  Location: [facility] CATH LAB;  Service: Cardiovascular;  Laterality: N/A;   TONSILLECTOMY     TOTAL ABDOMINAL HYSTERECTOMY W/ BILATERAL SALPINGOOPHORECTOMY     for heavy periods with appendectomy    Family History  Problem Relation Age of Onset   Diabetes Mother    Heart disease Mother        CAD   Hyperlipidemia Mother    Hypertension Mother    Kidney disease Mother    Kidney disease Father    Breast cancer Maternal Aunt    Heart disease Maternal Grandmother  Heart disease Other        maternal aunts and uncles   Colon cancer Neg Hx    Esophageal cancer Neg Hx        Current Outpatient Medications:    Blood Glucose Monitoring Suppl (ACCU-CHEK GUIDE) w/Device KIT, 1 kit by Does not apply route as directed., Disp: 1 kit, Rfl: 0   citalopram (CELEXA) 10 MG tablet, Take 1 tablet (10 mg total) by mouth daily., Disp: 30 tablet, Rfl: 0   clopidogrel (PLAVIX) 75 MG tablet, Take 75 mg by mouth daily. , Disp: , Rfl:    ezetimibe (ZETIA) 10 MG tablet, Take 1 tablet (10 mg total) by mouth daily., Disp: 90 tablet, Rfl: 1   furosemide (LASIX) 40 MG tablet, Take 0.5 tablets (20 mg total) by mouth daily., Disp: 30 tablet, Rfl: 0    glucose blood (ACCU-CHEK GUIDE) test strip, Use to check blood sugars 3 times daily, Disp: 100 each, Rfl: 12   insulin aspart (NOVOLOG) 100 UNIT/ML FlexPen, 0-9 Units, Subcutaneous, 3 times daily with meals CBG < 70: Implement Hypoglycemia measures, Call primary MD CBG 70 - 120: 0 units CBG 121 - 150: 1 unit CBG 151 - 200: 2 units CBG 201 - 250: 3 units CBG 251 - 300: 5 units CBG 301 - 350: 7 units CBG 351 - 400: 9 units CBG > 400: call primary MD, Disp: 15 mL, Rfl: 0   insulin glargine (LANTUS SOLOSTAR) 100 UNIT/ML Solostar Pen, Inject 16 Units into the skin daily., Disp: 5 pen, Rfl: 1   Insulin Pen Needle 32G X 4 MM MISC, 1 Device by Does not apply route in the morning, at noon, in the evening, and at bedtime., Disp: 90 each, Rfl: 0   latanoprost (XALATAN) 0.005 % ophthalmic solution, Place 1 drop into both eyes at bedtime. , Disp: , Rfl:    lidocaine-prilocaine (EMLA) cream, Apply 1 application topically as needed. Please apply EMLA over the Port-A-Cath site 1 hour prior to coming to our office. (Patient taking differently: Apply 1 application topically daily as needed (for port). ), Disp: 30 g, Rfl: 5   loperamide (IMODIUM A-D) 2 MG tablet, Take 2 mg by mouth 4 (four) times daily as needed for diarrhea or loose stools., Disp: , Rfl:    meclizine (ANTIVERT) 25 MG tablet, Take 1 tablet (25 mg total) by mouth 3 (three) times daily as needed for dizziness., Disp: 30 tablet, Rfl: 0   mirtazapine (REMERON) 15 MG tablet, Take 1 tablet (15 mg total) by mouth at bedtime., Disp: 90 tablet, Rfl: 0   ONETOUCH VERIO test strip, USE TO TEST BLOOD SUGAR THREE TIMES DAILY, Disp: 300 each, Rfl: 2   pantoprazole (PROTONIX) 40 MG tablet, Take 1 tablet (40 mg total) by mouth 2 (two) times daily before a meal., Disp: 180 tablet, Rfl: 1   spironolactone (ALDACTONE) 25 MG tablet, Take 0.5 tablets (12.5 mg total) by mouth daily., Disp: 15 tablet, Rfl: 0   timolol (TIMOPTIC) 0.5 % ophthalmic solution, Place 1  drop into both eyes daily. , Disp: , Rfl:    triamcinolone cream (KENALOG) 0.1 %, Apply 1 application topically 2 (two) times daily. (Patient taking differently: Apply 1 application topically 2 (two) times daily as needed (for rask skin). ), Disp: 30 g, Rfl: 0   XIFAXAN 550 MG TABS tablet, Take 1 tablet (550 mg total) by mouth 2 (two) times daily., Disp: 180 tablet, Rfl: 3  EXAM:  VITALS per patient if applicable:  GENERAL:  alert, oriented, appears well and in no acute distress  HEENT: atraumatic, conjunttiva clear, no obvious abnormalities on inspection of external nose and ears  NECK: normal movements of the head and neck  LUNGS: on inspection no signs of respiratory distress, breathing rate appears normal, no obvious gross SOB, gasping or wheezing  CV: no obvious cyanosis  MS: moves all visible extremities without noticeable abnormality  PSYCH/NEURO: pleasant and cooperative, no obvious depression or anxiety, speech and thought processing grossly intact  ASSESSMENT AND PLAN:  Discussed the following assessment and plan:  1. Severe diabetic hypoglycemia [facility]) - Reviewed hospital notes, discharge instructions, labs, and imaging that were done during her admission. All questions answered to the best of my ability.  - She seems to be doing well with SSI. Continue to follow up with endocrinology.  - encouraged heart healthy eating.   2. Anxiety - Will increase celexa to 20 mg to see if this helps with her anxiety  - citalopram (CELEXA) 20 MG tablet; Take 1 tablet (20 mg total) by mouth daily.  Dispense: 90 tablet; Refill: 0  3. Essential hypertension - Appears well controlled.  - Continue without Norvasc at this time   4. Weakness generalized - Encouraged to continue to work with PT but she needs to be doing exercising and working on getting out of bed on her own, multiple times a day.  I do think light weight wheelchair be beneficial for the patient as this would be easier to  maneuver, be pushed and packed up by her elderly husband.  5. Failure to thrive in adult - Needs to work on getting out of bed.  - Continue with appetite stimulant ( Remeron)  - Look at meal replacement shakes such as Glucerna     I discussed the assessment and treatment plan with the patient. The patient was provided an opportunity to ask questions and all were answered. The patient agreed with the plan and demonstrated an understanding of the instructions.   The patient was advised to call back or seek an in-person evaluation if the symptoms worsen or if the condition fails to improve as anticipated.   [name], NP

## 2020-04-16 ENCOUNTER — Encounter: Payer: Self-pay | Admitting: Family Medicine

## 2020-04-16 ENCOUNTER — Telehealth: Payer: Self-pay | Admitting: Adult Health

## 2020-04-16 ENCOUNTER — Other Ambulatory Visit: Payer: Self-pay | Admitting: *Deleted

## 2020-04-16 DIAGNOSIS — I130 Hypertensive heart and chronic kidney disease with heart failure and stage 1 through stage 4 chronic kidney disease, or unspecified chronic kidney disease: Secondary | ICD-10-CM | POA: Diagnosis not present

## 2020-04-16 DIAGNOSIS — E1142 Type 2 diabetes mellitus with diabetic polyneuropathy: Secondary | ICD-10-CM | POA: Diagnosis not present

## 2020-04-16 DIAGNOSIS — M1030 Gout due to renal impairment, unspecified site: Secondary | ICD-10-CM | POA: Diagnosis not present

## 2020-04-16 DIAGNOSIS — I252 Old myocardial infarction: Secondary | ICD-10-CM | POA: Diagnosis not present

## 2020-04-16 DIAGNOSIS — N1831 Chronic kidney disease, stage 3a: Secondary | ICD-10-CM | POA: Diagnosis not present

## 2020-04-16 DIAGNOSIS — E1122 Type 2 diabetes mellitus with diabetic chronic kidney disease: Secondary | ICD-10-CM | POA: Diagnosis not present

## 2020-04-16 DIAGNOSIS — I2510 Atherosclerotic heart disease of native coronary artery without angina pectoris: Secondary | ICD-10-CM | POA: Diagnosis not present

## 2020-04-16 DIAGNOSIS — E11649 Type 2 diabetes mellitus with hypoglycemia without coma: Secondary | ICD-10-CM | POA: Diagnosis not present

## 2020-04-16 DIAGNOSIS — I5032 Chronic diastolic (congestive) heart failure: Secondary | ICD-10-CM | POA: Diagnosis not present

## 2020-04-16 NOTE — Telephone Encounter (Signed)
Noted  

## 2020-04-16 NOTE — Telephone Encounter (Signed)
[name] with [name] is calling stating that the pt declining hospital bed a this time.  Pt has obtain some version of their own.

## 2020-04-16 NOTE — Telephone Encounter (Signed)
Call from Herbert Deaner he stated that the dr need to place the order for the Hospital bed.

## 2020-04-16 NOTE — Telephone Encounter (Signed)
Left a detailed message on identified voicemail giving Amanda [name] authorization for the home equipment and PT orders.  Also advised him that Amanda [name] has sent in an increased dose of citalopram.  Advised a call back if any further assistance was needed.  Nothing further needed at this time.

## 2020-04-16 NOTE — Telephone Encounter (Signed)
Amanda [name] notified that order has been sent for lightweight wheelchair.  He will follow up with [employer].  Nothing further needed at this time.

## 2020-04-17 ENCOUNTER — Telehealth: Payer: Self-pay | Admitting: Family Medicine

## 2020-04-17 NOTE — Telephone Encounter (Signed)
Office note faxed along with demographics and insurance card.  Received confirmation that the fax was successful.  Nothing further needed.

## 2020-04-17 NOTE — Telephone Encounter (Signed)
Spoke to [employer] from [employer].  She needs you to addend your last virtual visit to mention the need for the light weight wheelchair.  I will also need to fax a copy of her demographics.  Pam's fax # is [phone]

## 2020-04-17 NOTE — Telephone Encounter (Signed)
Addendum made

## 2020-04-18 ENCOUNTER — Other Ambulatory Visit: Payer: Self-pay

## 2020-04-18 ENCOUNTER — Ambulatory Visit: Payer: Medicare HMO | Admitting: Gastroenterology

## 2020-04-18 MED ORDER — FREESTYLE LIBRE 2 READER DEVI: 0 refills | Status: AC

## 2020-04-18 MED ORDER — FREESTYLE LIBRE 2 SENSOR MISC: 2 refills | Status: AC

## 2020-04-18 NOTE — Patient Outreach (Signed)
[place] [facility]) Care Management  04/18/2020  Amanda [name] [birth year] [id number]  CSW made contact with pt's husband who reports no further needs.  CSW inquired about in-home support that he was considering arranging- [name] declines any CSW needs/support and requests no further outreach calls. CSW will sign off and advise [facility] team and PCP.   Eduard Clos, MSW, [place] Worker  [place] [phone]

## 2020-04-21 DIAGNOSIS — I5032 Chronic diastolic (congestive) heart failure: Secondary | ICD-10-CM | POA: Diagnosis not present

## 2020-04-21 DIAGNOSIS — N1831 Chronic kidney disease, stage 3a: Secondary | ICD-10-CM | POA: Diagnosis not present

## 2020-04-21 DIAGNOSIS — E1122 Type 2 diabetes mellitus with diabetic chronic kidney disease: Secondary | ICD-10-CM | POA: Diagnosis not present

## 2020-04-21 DIAGNOSIS — I252 Old myocardial infarction: Secondary | ICD-10-CM | POA: Diagnosis not present

## 2020-04-21 DIAGNOSIS — M1030 Gout due to renal impairment, unspecified site: Secondary | ICD-10-CM | POA: Diagnosis not present

## 2020-04-21 DIAGNOSIS — I130 Hypertensive heart and chronic kidney disease with heart failure and stage 1 through stage 4 chronic kidney disease, or unspecified chronic kidney disease: Secondary | ICD-10-CM | POA: Diagnosis not present

## 2020-04-21 DIAGNOSIS — I2510 Atherosclerotic heart disease of native coronary artery without angina pectoris: Secondary | ICD-10-CM | POA: Diagnosis not present

## 2020-04-21 DIAGNOSIS — E11649 Type 2 diabetes mellitus with hypoglycemia without coma: Secondary | ICD-10-CM | POA: Diagnosis not present

## 2020-04-21 DIAGNOSIS — E1142 Type 2 diabetes mellitus with diabetic polyneuropathy: Secondary | ICD-10-CM | POA: Diagnosis not present

## 2020-04-22 DIAGNOSIS — I252 Old myocardial infarction: Secondary | ICD-10-CM | POA: Diagnosis not present

## 2020-04-22 DIAGNOSIS — E1142 Type 2 diabetes mellitus with diabetic polyneuropathy: Secondary | ICD-10-CM | POA: Diagnosis not present

## 2020-04-22 DIAGNOSIS — I130 Hypertensive heart and chronic kidney disease with heart failure and stage 1 through stage 4 chronic kidney disease, or unspecified chronic kidney disease: Secondary | ICD-10-CM | POA: Diagnosis not present

## 2020-04-22 DIAGNOSIS — M1030 Gout due to renal impairment, unspecified site: Secondary | ICD-10-CM | POA: Diagnosis not present

## 2020-04-22 DIAGNOSIS — I5032 Chronic diastolic (congestive) heart failure: Secondary | ICD-10-CM | POA: Diagnosis not present

## 2020-04-22 DIAGNOSIS — E1122 Type 2 diabetes mellitus with diabetic chronic kidney disease: Secondary | ICD-10-CM | POA: Diagnosis not present

## 2020-04-22 DIAGNOSIS — N1831 Chronic kidney disease, stage 3a: Secondary | ICD-10-CM | POA: Diagnosis not present

## 2020-04-22 DIAGNOSIS — E11649 Type 2 diabetes mellitus with hypoglycemia without coma: Secondary | ICD-10-CM | POA: Diagnosis not present

## 2020-04-22 DIAGNOSIS — I2510 Atherosclerotic heart disease of native coronary artery without angina pectoris: Secondary | ICD-10-CM | POA: Diagnosis not present

## 2020-04-23 DIAGNOSIS — E1122 Type 2 diabetes mellitus with diabetic chronic kidney disease: Secondary | ICD-10-CM | POA: Diagnosis not present

## 2020-04-23 DIAGNOSIS — I130 Hypertensive heart and chronic kidney disease with heart failure and stage 1 through stage 4 chronic kidney disease, or unspecified chronic kidney disease: Secondary | ICD-10-CM | POA: Diagnosis not present

## 2020-04-23 DIAGNOSIS — M1030 Gout due to renal impairment, unspecified site: Secondary | ICD-10-CM | POA: Diagnosis not present

## 2020-04-23 DIAGNOSIS — I5032 Chronic diastolic (congestive) heart failure: Secondary | ICD-10-CM | POA: Diagnosis not present

## 2020-04-23 DIAGNOSIS — I252 Old myocardial infarction: Secondary | ICD-10-CM | POA: Diagnosis not present

## 2020-04-23 DIAGNOSIS — N1831 Chronic kidney disease, stage 3a: Secondary | ICD-10-CM | POA: Diagnosis not present

## 2020-04-23 DIAGNOSIS — I2510 Atherosclerotic heart disease of native coronary artery without angina pectoris: Secondary | ICD-10-CM | POA: Diagnosis not present

## 2020-04-23 DIAGNOSIS — E11649 Type 2 diabetes mellitus with hypoglycemia without coma: Secondary | ICD-10-CM | POA: Diagnosis not present

## 2020-04-23 DIAGNOSIS — E1142 Type 2 diabetes mellitus with diabetic polyneuropathy: Secondary | ICD-10-CM | POA: Diagnosis not present

## 2020-04-24 ENCOUNTER — Other Ambulatory Visit: Payer: Self-pay | Admitting: Adult Health

## 2020-04-24 DIAGNOSIS — R630 Anorexia: Secondary | ICD-10-CM

## 2020-04-24 DIAGNOSIS — F419 Anxiety disorder, unspecified: Secondary | ICD-10-CM

## 2020-04-25 ENCOUNTER — Telehealth: Payer: Self-pay | Admitting: Adult Health

## 2020-04-25 DIAGNOSIS — N1831 Chronic kidney disease, stage 3a: Secondary | ICD-10-CM | POA: Diagnosis not present

## 2020-04-25 DIAGNOSIS — I252 Old myocardial infarction: Secondary | ICD-10-CM | POA: Diagnosis not present

## 2020-04-25 DIAGNOSIS — I5032 Chronic diastolic (congestive) heart failure: Secondary | ICD-10-CM | POA: Diagnosis not present

## 2020-04-25 DIAGNOSIS — I2510 Atherosclerotic heart disease of native coronary artery without angina pectoris: Secondary | ICD-10-CM | POA: Diagnosis not present

## 2020-04-25 DIAGNOSIS — M1030 Gout due to renal impairment, unspecified site: Secondary | ICD-10-CM | POA: Diagnosis not present

## 2020-04-25 DIAGNOSIS — E1142 Type 2 diabetes mellitus with diabetic polyneuropathy: Secondary | ICD-10-CM | POA: Diagnosis not present

## 2020-04-25 DIAGNOSIS — E1122 Type 2 diabetes mellitus with diabetic chronic kidney disease: Secondary | ICD-10-CM | POA: Diagnosis not present

## 2020-04-25 DIAGNOSIS — I130 Hypertensive heart and chronic kidney disease with heart failure and stage 1 through stage 4 chronic kidney disease, or unspecified chronic kidney disease: Secondary | ICD-10-CM | POA: Diagnosis not present

## 2020-04-25 DIAGNOSIS — E11649 Type 2 diabetes mellitus with hypoglycemia without coma: Secondary | ICD-10-CM | POA: Diagnosis not present

## 2020-04-25 NOTE — Telephone Encounter (Signed)
Ok for verbal orders ?

## 2020-04-25 NOTE — Telephone Encounter (Signed)
Get a verbal order to add occupational therapy and home health aid  [facility] [phone]

## 2020-04-25 NOTE — Telephone Encounter (Signed)
DENIED.  PT IS TAKING 20 MG TABS.  MESSAGE SENT TO THE PHARMACY TO D/C 10 MG TABS.  NOTHING FURTHER NEEDED.

## 2020-04-25 NOTE — Telephone Encounter (Signed)
Spoke to [place] and advised her to proceed with order for OT and home health aid.  Nothing further needed.

## 2020-04-29 ENCOUNTER — Telehealth: Payer: Self-pay

## 2020-04-29 DIAGNOSIS — I130 Hypertensive heart and chronic kidney disease with heart failure and stage 1 through stage 4 chronic kidney disease, or unspecified chronic kidney disease: Secondary | ICD-10-CM | POA: Diagnosis not present

## 2020-04-29 DIAGNOSIS — E1142 Type 2 diabetes mellitus with diabetic polyneuropathy: Secondary | ICD-10-CM | POA: Diagnosis not present

## 2020-04-29 DIAGNOSIS — E1122 Type 2 diabetes mellitus with diabetic chronic kidney disease: Secondary | ICD-10-CM | POA: Diagnosis not present

## 2020-04-29 DIAGNOSIS — N1831 Chronic kidney disease, stage 3a: Secondary | ICD-10-CM | POA: Diagnosis not present

## 2020-04-29 DIAGNOSIS — E11649 Type 2 diabetes mellitus with hypoglycemia without coma: Secondary | ICD-10-CM | POA: Diagnosis not present

## 2020-04-29 DIAGNOSIS — I252 Old myocardial infarction: Secondary | ICD-10-CM | POA: Diagnosis not present

## 2020-04-29 DIAGNOSIS — I5032 Chronic diastolic (congestive) heart failure: Secondary | ICD-10-CM | POA: Diagnosis not present

## 2020-04-29 DIAGNOSIS — I2510 Atherosclerotic heart disease of native coronary artery without angina pectoris: Secondary | ICD-10-CM | POA: Diagnosis not present

## 2020-04-29 DIAGNOSIS — M1030 Gout due to renal impairment, unspecified site: Secondary | ICD-10-CM | POA: Diagnosis not present

## 2020-04-29 NOTE — Telephone Encounter (Signed)
Is she also on rapid acting insulin? If not, decrease Lantus to 10 units at night.

## 2020-04-29 NOTE — Telephone Encounter (Signed)
Patient's husband calling with low readings.  Last night her blood sugar was running 50-60. She is taking 20 units Lantus a night.  This morning it was still low, she drank a soda, number came up to 81 then 103, now back down to 66.  Her husband states she is not eating like she use to and only had the soda this morning.

## 2020-04-29 NOTE — Telephone Encounter (Signed)
Patient husband notified of change in Lantus and states that he understands.

## 2020-04-30 ENCOUNTER — Encounter: Payer: Self-pay | Admitting: Internal Medicine

## 2020-04-30 ENCOUNTER — Other Ambulatory Visit: Payer: Self-pay | Admitting: Adult Health

## 2020-04-30 DIAGNOSIS — M1030 Gout due to renal impairment, unspecified site: Secondary | ICD-10-CM | POA: Diagnosis not present

## 2020-04-30 DIAGNOSIS — E11649 Type 2 diabetes mellitus with hypoglycemia without coma: Secondary | ICD-10-CM | POA: Diagnosis not present

## 2020-04-30 DIAGNOSIS — I5032 Chronic diastolic (congestive) heart failure: Secondary | ICD-10-CM | POA: Diagnosis not present

## 2020-04-30 DIAGNOSIS — N1831 Chronic kidney disease, stage 3a: Secondary | ICD-10-CM | POA: Diagnosis not present

## 2020-04-30 DIAGNOSIS — I252 Old myocardial infarction: Secondary | ICD-10-CM | POA: Diagnosis not present

## 2020-04-30 DIAGNOSIS — E1122 Type 2 diabetes mellitus with diabetic chronic kidney disease: Secondary | ICD-10-CM | POA: Diagnosis not present

## 2020-04-30 DIAGNOSIS — E1142 Type 2 diabetes mellitus with diabetic polyneuropathy: Secondary | ICD-10-CM | POA: Diagnosis not present

## 2020-04-30 DIAGNOSIS — I2510 Atherosclerotic heart disease of native coronary artery without angina pectoris: Secondary | ICD-10-CM | POA: Diagnosis not present

## 2020-04-30 DIAGNOSIS — I130 Hypertensive heart and chronic kidney disease with heart failure and stage 1 through stage 4 chronic kidney disease, or unspecified chronic kidney disease: Secondary | ICD-10-CM | POA: Diagnosis not present

## 2020-05-02 ENCOUNTER — Telehealth: Payer: Self-pay | Admitting: Adult Health

## 2020-05-02 DIAGNOSIS — E11649 Type 2 diabetes mellitus with hypoglycemia without coma: Secondary | ICD-10-CM | POA: Diagnosis not present

## 2020-05-02 DIAGNOSIS — I252 Old myocardial infarction: Secondary | ICD-10-CM | POA: Diagnosis not present

## 2020-05-02 DIAGNOSIS — I5032 Chronic diastolic (congestive) heart failure: Secondary | ICD-10-CM | POA: Diagnosis not present

## 2020-05-02 DIAGNOSIS — I130 Hypertensive heart and chronic kidney disease with heart failure and stage 1 through stage 4 chronic kidney disease, or unspecified chronic kidney disease: Secondary | ICD-10-CM | POA: Diagnosis not present

## 2020-05-02 DIAGNOSIS — N1831 Chronic kidney disease, stage 3a: Secondary | ICD-10-CM | POA: Diagnosis not present

## 2020-05-02 DIAGNOSIS — I2510 Atherosclerotic heart disease of native coronary artery without angina pectoris: Secondary | ICD-10-CM | POA: Diagnosis not present

## 2020-05-02 DIAGNOSIS — E1122 Type 2 diabetes mellitus with diabetic chronic kidney disease: Secondary | ICD-10-CM | POA: Diagnosis not present

## 2020-05-02 DIAGNOSIS — M1030 Gout due to renal impairment, unspecified site: Secondary | ICD-10-CM | POA: Diagnosis not present

## 2020-05-02 DIAGNOSIS — E1142 Type 2 diabetes mellitus with diabetic polyneuropathy: Secondary | ICD-10-CM | POA: Diagnosis not present

## 2020-05-02 MED ORDER — ONDANSETRON 4 MG PO TBDP: 4.0000 mg | ORAL_TABLET | Freq: Three times a day (TID) | ORAL | 3 refills | Status: AC | PRN

## 2020-05-02 NOTE — Telephone Encounter (Signed)
[place] for verbal orders. Can send in Zofran 4 mg tabs for nausea - take every 8 hours PRN, 20 tabs, 3 refills

## 2020-05-02 NOTE — Telephone Encounter (Signed)
Medication sent to the pharmacy.  Mr. Heidel notified to pick up at the pharmacy.  Spoke to [place] and advised that she proceed with orders for OT.  Nothing further needed.

## 2020-05-02 NOTE — Telephone Encounter (Signed)
[name], pt's spouse on DPR, stated pt will throw up the little she eats or dry heaves.   [name] can be reached at [phone]

## 2020-05-02 NOTE — Telephone Encounter (Signed)
Adele Barthel from [facility] called to let [name] know that she just completed occupational Health Therapy today.   She wanted Tommi Rumps to know that the patient was dry heaving, having stomach cramps, vomiting, and has minimum movement with exertion. When raising her arm, turning over in bed and trying to drink water.  She needs verbals orders for OT  1 week 1 2 week 1  She would also like for a RN to contact the patient tomorrow to see how everything is going.  [employer] [place]

## 2020-05-04 ENCOUNTER — Inpatient Hospital Stay (HOSPITAL_COMMUNITY): Payer: Medicare HMO

## 2020-05-04 ENCOUNTER — Emergency Department (HOSPITAL_COMMUNITY): Payer: Medicare HMO

## 2020-05-04 ENCOUNTER — Inpatient Hospital Stay (HOSPITAL_COMMUNITY)
Admission: EM | Admit: 2020-05-04 | Discharge: 2020-05-08 | DRG: 441 | Disposition: A | Discharge status: Hospice | Payer: Medicare HMO | Attending: Family Medicine | Admitting: Family Medicine

## 2020-05-04 ENCOUNTER — Encounter (HOSPITAL_COMMUNITY): Payer: Self-pay | Admitting: *Deleted

## 2020-05-04 ENCOUNTER — Other Ambulatory Visit: Payer: Self-pay

## 2020-05-04 DIAGNOSIS — N179 Acute kidney failure, unspecified: Secondary | ICD-10-CM | POA: Diagnosis not present

## 2020-05-04 DIAGNOSIS — Z955 Presence of coronary angioplasty implant and graft: Secondary | ICD-10-CM

## 2020-05-04 DIAGNOSIS — R0902 Hypoxemia: Secondary | ICD-10-CM | POA: Diagnosis not present

## 2020-05-04 DIAGNOSIS — E11319 Type 2 diabetes mellitus with unspecified diabetic retinopathy without macular edema: Secondary | ICD-10-CM | POA: Diagnosis present

## 2020-05-04 DIAGNOSIS — D72829 Elevated white blood cell count, unspecified: Secondary | ICD-10-CM | POA: Diagnosis present

## 2020-05-04 DIAGNOSIS — L89322 Pressure ulcer of left buttock, stage 2: Secondary | ICD-10-CM | POA: Diagnosis present

## 2020-05-04 DIAGNOSIS — F05 Delirium due to known physiological condition: Secondary | ICD-10-CM | POA: Diagnosis not present

## 2020-05-04 DIAGNOSIS — R197 Diarrhea, unspecified: Secondary | ICD-10-CM | POA: Diagnosis not present

## 2020-05-04 DIAGNOSIS — E669 Obesity, unspecified: Secondary | ICD-10-CM | POA: Diagnosis present

## 2020-05-04 DIAGNOSIS — Z803 Family history of malignant neoplasm of breast: Secondary | ICD-10-CM

## 2020-05-04 DIAGNOSIS — R161 Splenomegaly, not elsewhere classified: Secondary | ICD-10-CM | POA: Diagnosis present

## 2020-05-04 DIAGNOSIS — Z91048 Other nonmedicinal substance allergy status: Secondary | ICD-10-CM

## 2020-05-04 DIAGNOSIS — R188 Other ascites: Secondary | ICD-10-CM

## 2020-05-04 DIAGNOSIS — N183 Chronic kidney disease, stage 3 unspecified: Secondary | ICD-10-CM | POA: Diagnosis not present

## 2020-05-04 DIAGNOSIS — I252 Old myocardial infarction: Secondary | ICD-10-CM

## 2020-05-04 DIAGNOSIS — Z6834 Body mass index (BMI) 34.0-34.9, adult: Secondary | ICD-10-CM

## 2020-05-04 DIAGNOSIS — I5032 Chronic diastolic (congestive) heart failure: Secondary | ICD-10-CM | POA: Diagnosis not present

## 2020-05-04 DIAGNOSIS — Z66 Do not resuscitate: Secondary | ICD-10-CM

## 2020-05-04 DIAGNOSIS — E872 Acidosis: Secondary | ICD-10-CM | POA: Diagnosis not present

## 2020-05-04 DIAGNOSIS — Z881 Allergy status to other antibiotic agents status: Secondary | ICD-10-CM

## 2020-05-04 DIAGNOSIS — E871 Hypo-osmolality and hyponatremia: Secondary | ICD-10-CM | POA: Diagnosis present

## 2020-05-04 DIAGNOSIS — Z833 Family history of diabetes mellitus: Secondary | ICD-10-CM | POA: Diagnosis not present

## 2020-05-04 DIAGNOSIS — K766 Portal hypertension: Secondary | ICD-10-CM | POA: Diagnosis present

## 2020-05-04 DIAGNOSIS — I1310 Hypertensive heart and chronic kidney disease without heart failure, with stage 1 through stage 4 chronic kidney disease, or unspecified chronic kidney disease: Secondary | ICD-10-CM | POA: Diagnosis present

## 2020-05-04 DIAGNOSIS — L409 Psoriasis, unspecified: Secondary | ICD-10-CM | POA: Diagnosis present

## 2020-05-04 DIAGNOSIS — Z20822 Contact with and (suspected) exposure to covid-19: Secondary | ICD-10-CM | POA: Diagnosis not present

## 2020-05-04 DIAGNOSIS — I10 Essential (primary) hypertension: Secondary | ICD-10-CM | POA: Diagnosis present

## 2020-05-04 DIAGNOSIS — D684 Acquired coagulation factor deficiency: Secondary | ICD-10-CM | POA: Diagnosis present

## 2020-05-04 DIAGNOSIS — R52 Pain, unspecified: Secondary | ICD-10-CM | POA: Diagnosis not present

## 2020-05-04 DIAGNOSIS — E876 Hypokalemia: Secondary | ICD-10-CM | POA: Diagnosis present

## 2020-05-04 DIAGNOSIS — N2889 Other specified disorders of kidney and ureter: Secondary | ICD-10-CM | POA: Diagnosis not present

## 2020-05-04 DIAGNOSIS — K767 Hepatorenal syndrome: Secondary | ICD-10-CM | POA: Diagnosis not present

## 2020-05-04 DIAGNOSIS — K529 Noninfective gastroenteritis and colitis, unspecified: Secondary | ICD-10-CM | POA: Diagnosis present

## 2020-05-04 DIAGNOSIS — M2550 Pain in unspecified joint: Secondary | ICD-10-CM | POA: Diagnosis not present

## 2020-05-04 DIAGNOSIS — K8020 Calculus of gallbladder without cholecystitis without obstruction: Secondary | ICD-10-CM | POA: Diagnosis not present

## 2020-05-04 DIAGNOSIS — N1832 Chronic kidney disease, stage 3b: Secondary | ICD-10-CM | POA: Diagnosis present

## 2020-05-04 DIAGNOSIS — Z841 Family history of disorders of kidney and ureter: Secondary | ICD-10-CM

## 2020-05-04 DIAGNOSIS — Z85828 Personal history of other malignant neoplasm of skin: Secondary | ICD-10-CM

## 2020-05-04 DIAGNOSIS — R1310 Dysphagia, unspecified: Secondary | ICD-10-CM | POA: Diagnosis present

## 2020-05-04 DIAGNOSIS — K7469 Other cirrhosis of liver: Secondary | ICD-10-CM | POA: Diagnosis not present

## 2020-05-04 DIAGNOSIS — R627 Adult failure to thrive: Secondary | ICD-10-CM | POA: Diagnosis not present

## 2020-05-04 DIAGNOSIS — E1122 Type 2 diabetes mellitus with diabetic chronic kidney disease: Secondary | ICD-10-CM | POA: Diagnosis not present

## 2020-05-04 DIAGNOSIS — E1142 Type 2 diabetes mellitus with diabetic polyneuropathy: Secondary | ICD-10-CM | POA: Diagnosis present

## 2020-05-04 DIAGNOSIS — E785 Hyperlipidemia, unspecified: Secondary | ICD-10-CM | POA: Diagnosis present

## 2020-05-04 DIAGNOSIS — R1084 Generalized abdominal pain: Secondary | ICD-10-CM

## 2020-05-04 DIAGNOSIS — D45 Polycythemia vera: Secondary | ICD-10-CM | POA: Diagnosis present

## 2020-05-04 DIAGNOSIS — R531 Weakness: Secondary | ICD-10-CM | POA: Diagnosis not present

## 2020-05-04 DIAGNOSIS — Z880 Allergy status to penicillin: Secondary | ICD-10-CM

## 2020-05-04 DIAGNOSIS — R000 Tachycardia, unspecified: Secondary | ICD-10-CM | POA: Diagnosis not present

## 2020-05-04 DIAGNOSIS — J9811 Atelectasis: Secondary | ICD-10-CM | POA: Diagnosis not present

## 2020-05-04 DIAGNOSIS — Z794 Long term (current) use of insulin: Secondary | ICD-10-CM

## 2020-05-04 DIAGNOSIS — K7460 Unspecified cirrhosis of liver: Secondary | ICD-10-CM | POA: Diagnosis not present

## 2020-05-04 DIAGNOSIS — Z888 Allergy status to other drugs, medicaments and biological substances status: Secondary | ICD-10-CM

## 2020-05-04 DIAGNOSIS — Z87891 Personal history of nicotine dependence: Secondary | ICD-10-CM

## 2020-05-04 DIAGNOSIS — M109 Gout, unspecified: Secondary | ICD-10-CM | POA: Diagnosis present

## 2020-05-04 DIAGNOSIS — Z79899 Other long term (current) drug therapy: Secondary | ICD-10-CM

## 2020-05-04 DIAGNOSIS — Z91040 Latex allergy status: Secondary | ICD-10-CM

## 2020-05-04 DIAGNOSIS — Z7401 Bed confinement status: Secondary | ICD-10-CM | POA: Diagnosis not present

## 2020-05-04 DIAGNOSIS — R410 Disorientation, unspecified: Secondary | ICD-10-CM

## 2020-05-04 DIAGNOSIS — I2510 Atherosclerotic heart disease of native coronary artery without angina pectoris: Secondary | ICD-10-CM | POA: Diagnosis present

## 2020-05-04 DIAGNOSIS — K7581 Nonalcoholic steatohepatitis (NASH): Secondary | ICD-10-CM | POA: Diagnosis not present

## 2020-05-04 DIAGNOSIS — Z83438 Family history of other disorder of lipoprotein metabolism and other lipidemia: Secondary | ICD-10-CM

## 2020-05-04 DIAGNOSIS — G4733 Obstructive sleep apnea (adult) (pediatric): Secondary | ICD-10-CM | POA: Diagnosis present

## 2020-05-04 DIAGNOSIS — I700 Atherosclerosis of aorta: Secondary | ICD-10-CM | POA: Diagnosis present

## 2020-05-04 DIAGNOSIS — Z8249 Family history of ischemic heart disease and other diseases of the circulatory system: Secondary | ICD-10-CM

## 2020-05-04 DIAGNOSIS — Z515 Encounter for palliative care: Secondary | ICD-10-CM | POA: Diagnosis not present

## 2020-05-04 DIAGNOSIS — Z7902 Long term (current) use of antithrombotics/antiplatelets: Secondary | ICD-10-CM

## 2020-05-04 LAB — COMPREHENSIVE METABOLIC PANEL
ALT: 7 U/L (ref 0–44)
AST: 24 U/L (ref 15–41)
Albumin: 2.8 g/dL — ABNORMAL LOW (ref 3.5–5.0)
Alkaline Phosphatase: 160 U/L — ABNORMAL HIGH (ref 38–126)
Anion gap: 15 (ref 5–15)
BUN: 17 mg/dL (ref 8–23)
CO2: 17 mmol/L — ABNORMAL LOW (ref 22–32)
Calcium: 8.6 mg/dL — ABNORMAL LOW (ref 8.9–10.3)
Chloride: 102 mmol/L (ref 98–111)
Creatinine, Ser: 1.43 mg/dL — ABNORMAL HIGH (ref 0.44–1.00)
GFR calc Af Amer: 40 mL/min — ABNORMAL LOW (ref >60)
GFR calc non Af Amer: 35 mL/min — ABNORMAL LOW (ref >60)
Glucose, Bld: 158 mg/dL — ABNORMAL HIGH (ref 70–99)
Potassium: 3 mmol/L — ABNORMAL LOW (ref 3.5–5.1)
Sodium: 134 mmol/L — ABNORMAL LOW (ref 135–145)
Total Bilirubin: 2.6 mg/dL — ABNORMAL HIGH (ref 0.3–1.2)
Total Protein: 6.3 g/dL — ABNORMAL LOW (ref 6.5–8.1)

## 2020-05-04 LAB — CBC
HCT: 44.7 % (ref 36.0–46.0)
Hemoglobin: 13.8 g/dL (ref 12.0–15.0)
MCH: 28.2 pg (ref 26.0–34.0)
MCHC: 30.9 g/dL (ref 30.0–36.0)
MCV: 91.2 fL (ref 80.0–100.0)
Platelets: 566 10*3/uL — ABNORMAL HIGH (ref 150–400)
RBC: 4.9 MIL/uL (ref 3.87–5.11)
RDW: 22.2 % — ABNORMAL HIGH (ref 11.5–15.5)
WBC: 45.8 10*3/uL — ABNORMAL HIGH (ref 4.0–10.5)
nRBC: 0.1 % (ref 0.0–0.2)

## 2020-05-04 LAB — URINALYSIS, ROUTINE W REFLEX MICROSCOPIC
Bilirubin Urine: NEGATIVE
Glucose, UA: NEGATIVE mg/dL
Hgb urine dipstick: NEGATIVE
Ketones, ur: NEGATIVE mg/dL
Nitrite: NEGATIVE
Protein, ur: NEGATIVE mg/dL
Specific Gravity, Urine: >1.046 — ABNORMAL HIGH (ref 1.005–1.030)
pH: 5 (ref 5.0–8.0)

## 2020-05-04 LAB — CBC WITH DIFFERENTIAL/PLATELET
Abs Immature Granulocytes: 0 10*3/uL (ref 0.00–0.07)
Basophils Absolute: 0 10*3/uL (ref 0.0–0.1)
Basophils Relative: 0 %
Eosinophils Absolute: 0 10*3/uL (ref 0.0–0.5)
Eosinophils Relative: 0 %
HCT: 45.2 % (ref 36.0–46.0)
Hemoglobin: 13.8 g/dL (ref 12.0–15.0)
Lymphocytes Relative: 3 %
Lymphs Abs: 1.7 10*3/uL (ref 0.7–4.0)
MCH: 28.1 pg (ref 26.0–34.0)
MCHC: 30.5 g/dL (ref 30.0–36.0)
MCV: 92.1 fL (ref 80.0–100.0)
Monocytes Absolute: 0.6 10*3/uL (ref 0.1–1.0)
Monocytes Relative: 1 %
Neutro Abs: 52.8 10*3/uL — ABNORMAL HIGH (ref 1.7–7.7)
Neutrophils Relative %: 96 %
Platelets: 542 10*3/uL — ABNORMAL HIGH (ref 150–400)
RBC: 4.91 MIL/uL (ref 3.87–5.11)
RDW: 22.3 % — ABNORMAL HIGH (ref 11.5–15.5)
WBC: 55 10*3/uL (ref 4.0–10.5)
nRBC: 0 % (ref 0.0–0.2)
nRBC: 1 /100 WBC — ABNORMAL HIGH

## 2020-05-04 LAB — BODY FLUID CELL COUNT WITH DIFFERENTIAL
Eos, Fluid: 0 %
Lymphs, Fluid: 22 %
Monocyte-Macrophage-Serous Fluid: 19 % — ABNORMAL LOW (ref 50–90)
Neutrophil Count, Fluid: 59 % — ABNORMAL HIGH (ref 0–25)
Total Nucleated Cell Count, Fluid: 230 cu mm (ref 0–1000)

## 2020-05-04 LAB — GLUCOSE, CAPILLARY
Glucose-Capillary: 187 mg/dL — ABNORMAL HIGH (ref 70–99)
Glucose-Capillary: 142 mg/dL — ABNORMAL HIGH (ref 70–99)

## 2020-05-04 LAB — GRAM STAIN

## 2020-05-04 LAB — PROTEIN, PLEURAL OR PERITONEAL FLUID: Total protein, fluid: <3 g/dL

## 2020-05-04 LAB — PROTIME-INR
INR: 1.6 — ABNORMAL HIGH (ref 0.8–1.2)
Prothrombin Time: 18.5 seconds — ABNORMAL HIGH (ref 11.4–15.2)

## 2020-05-04 LAB — SARS CORONAVIRUS 2 BY RT PCR (HOSPITAL ORDER, PERFORMED IN ~~LOC~~ HOSPITAL LAB): SARS Coronavirus 2: NEGATIVE

## 2020-05-04 LAB — ALBUMIN, PLEURAL OR PERITONEAL FLUID: Albumin, Fluid: <1 g/dL

## 2020-05-04 LAB — AMMONIA: Ammonia: 64 umol/L — ABNORMAL HIGH (ref 9–35)

## 2020-05-04 LAB — CBG MONITORING, ED: Glucose-Capillary: 164 mg/dL — ABNORMAL HIGH (ref 70–99)

## 2020-05-04 LAB — LIPASE, BLOOD: Lipase: 27 U/L (ref 11–51)

## 2020-05-04 MED ORDER — PANTOPRAZOLE SODIUM 40 MG PO TBEC
40.0000 mg | DELAYED_RELEASE_TABLET | Freq: Every day | ORAL | Status: DC
  Administered 2020-05-05 – 2020-05-08 (×3): 40 mg via ORAL
  Filled 2020-05-04 (×4): qty 1

## 2020-05-04 MED ORDER — IOHEXOL 300 MG/ML  SOLN
80.0000 mL | Freq: Once | INTRAMUSCULAR | Status: AC | PRN
  Administered 2020-05-04: 80 mL via INTRAVENOUS

## 2020-05-04 MED ORDER — MIRTAZAPINE 15 MG PO TABS: 15.0000 mg | ORAL_TABLET | Freq: Every day | ORAL | Status: DC

## 2020-05-04 MED ORDER — LIDOCAINE HCL (PF) 1 % IJ SOLN
INTRAMUSCULAR | Status: AC
  Filled 2020-05-04: qty 30

## 2020-05-04 MED ORDER — ACETAMINOPHEN 325 MG PO TABS
650.0000 mg | ORAL_TABLET | Freq: Four times a day (QID) | ORAL | Status: DC | PRN
  Administered 2020-05-06: 650 mg via ORAL
  Filled 2020-05-04: qty 2

## 2020-05-04 MED ORDER — ALBUTEROL SULFATE (2.5 MG/3ML) 0.083% IN NEBU
2.5000 mg | INHALATION_SOLUTION | RESPIRATORY_TRACT | Status: DC | PRN
  Filled 2020-05-04: qty 3

## 2020-05-04 MED ORDER — MORPHINE SULFATE (PF) 4 MG/ML IV SOLN
4.0000 mg | Freq: Once | INTRAVENOUS | Status: AC
  Administered 2020-05-04: 4 mg via INTRAVENOUS
  Filled 2020-05-04: qty 1

## 2020-05-04 MED ORDER — POTASSIUM CHLORIDE CRYS ER 20 MEQ PO TBCR
40.0000 meq | EXTENDED_RELEASE_TABLET | Freq: Once | ORAL | Status: DC
  Filled 2020-05-04: qty 2

## 2020-05-04 MED ORDER — CITALOPRAM HYDROBROMIDE 10 MG PO TABS: 20.0000 mg | ORAL_TABLET | Freq: Every day | ORAL | Status: DC

## 2020-05-04 MED ORDER — SODIUM CHLORIDE 0.9% FLUSH
3.0000 mL | Freq: Once | INTRAVENOUS | Status: AC
  Administered 2020-05-04: 3 mL via INTRAVENOUS

## 2020-05-04 MED ORDER — LATANOPROST 0.005 % OP SOLN
1.0000 [drp] | Freq: Every day | OPHTHALMIC | Status: DC
  Administered 2020-05-04 – 2020-05-07 (×3): 1 [drp] via OPHTHALMIC
  Filled 2020-05-04 (×2): qty 2.5

## 2020-05-04 MED ORDER — SPIRONOLACTONE 12.5 MG HALF TABLET: 12.5000 mg | ORAL_TABLET | Freq: Every day | ORAL | Status: DC

## 2020-05-04 MED ORDER — SODIUM CHLORIDE 0.9 % IV SOLN
2.0000 g | INTRAVENOUS | Status: DC
  Administered 2020-05-04 – 2020-05-06 (×3): 2 g via INTRAVENOUS
  Filled 2020-05-04 (×4): qty 20

## 2020-05-04 MED ORDER — LOPERAMIDE HCL 2 MG PO CAPS: 2.0000 mg | ORAL_CAPSULE | Freq: Four times a day (QID) | ORAL | Status: DC | PRN

## 2020-05-04 MED ORDER — ONDANSETRON HCL 4 MG/2ML IJ SOLN
4.0000 mg | Freq: Once | INTRAMUSCULAR | Status: AC
  Administered 2020-05-04: 4 mg via INTRAVENOUS
  Filled 2020-05-04: qty 2

## 2020-05-04 MED ORDER — CLOPIDOGREL BISULFATE 75 MG PO TABS
75.0000 mg | ORAL_TABLET | Freq: Every day | ORAL | Status: DC
  Administered 2020-05-05 – 2020-05-08 (×3): 75 mg via ORAL
  Filled 2020-05-04 (×4): qty 1

## 2020-05-04 MED ORDER — INSULIN ASPART 100 UNIT/ML ~~LOC~~ SOLN
0.0000 [IU] | Freq: Three times a day (TID) | SUBCUTANEOUS | Status: DC
  Administered 2020-05-04: 2 [IU] via SUBCUTANEOUS
  Administered 2020-05-05 (×2): 1 [IU] via SUBCUTANEOUS
  Administered 2020-05-06: 2 [IU] via SUBCUTANEOUS
  Administered 2020-05-06 – 2020-05-08 (×4): 1 [IU] via SUBCUTANEOUS

## 2020-05-04 MED ORDER — POTASSIUM CHLORIDE 10 MEQ/100ML IV SOLN
10.0000 meq | Freq: Once | INTRAVENOUS | Status: AC
  Administered 2020-05-04: 10 meq via INTRAVENOUS
  Filled 2020-05-04: qty 100

## 2020-05-04 MED ORDER — ONDANSETRON HCL 4 MG/2ML IJ SOLN
4.0000 mg | Freq: Four times a day (QID) | INTRAMUSCULAR | Status: DC | PRN
  Administered 2020-05-05 – 2020-05-06 (×2): 4 mg via INTRAVENOUS
  Filled 2020-05-04 (×2): qty 2

## 2020-05-04 MED ORDER — INSULIN GLARGINE 100 UNIT/ML SOLOSTAR PEN: 16.0000 [IU] | PEN_INJECTOR | Freq: Every day | SUBCUTANEOUS | Status: DC

## 2020-05-04 MED ORDER — EZETIMIBE 10 MG PO TABS
10.0000 mg | ORAL_TABLET | Freq: Every day | ORAL | Status: DC
  Administered 2020-05-05 – 2020-05-08 (×3): 10 mg via ORAL
  Filled 2020-05-04 (×4): qty 1

## 2020-05-04 MED ORDER — RIFAXIMIN 550 MG PO TABS
550.0000 mg | ORAL_TABLET | Freq: Two times a day (BID) | ORAL | Status: DC
  Administered 2020-05-05 – 2020-05-08 (×4): 550 mg via ORAL
  Filled 2020-05-04 (×9): qty 1

## 2020-05-04 MED ORDER — DIPHENOXYLATE-ATROPINE 2.5-0.025 MG PO TABS: 1.0000 | ORAL_TABLET | Freq: Four times a day (QID) | ORAL | Status: DC | PRN

## 2020-05-04 MED ORDER — SODIUM CHLORIDE 0.9 % IV BOLUS
1000.0000 mL | Freq: Once | INTRAVENOUS | Status: AC
  Administered 2020-05-04: 1000 mL via INTRAVENOUS

## 2020-05-04 MED ORDER — SODIUM CHLORIDE 0.9 % IV SOLN: INTRAVENOUS | Status: DC

## 2020-05-04 MED ORDER — ENOXAPARIN SODIUM 40 MG/0.4ML ~~LOC~~ SOLN
40.0000 mg | SUBCUTANEOUS | Status: DC
  Administered 2020-05-04 – 2020-05-05 (×2): 40 mg via SUBCUTANEOUS
  Filled 2020-05-04 (×2): qty 0.4

## 2020-05-04 MED ORDER — ACETAMINOPHEN 650 MG RE SUPP: 650.0000 mg | Freq: Four times a day (QID) | RECTAL | Status: DC | PRN

## 2020-05-04 MED ORDER — ONDANSETRON HCL 4 MG PO TABS: 4.0000 mg | ORAL_TABLET | Freq: Four times a day (QID) | ORAL | Status: DC | PRN

## 2020-05-04 MED ORDER — SODIUM CHLORIDE 0.9% FLUSH
3.0000 mL | Freq: Two times a day (BID) | INTRAVENOUS | Status: DC
  Administered 2020-05-04 – 2020-05-06 (×2): 3 mL via INTRAVENOUS

## 2020-05-04 MED ORDER — TIMOLOL MALEATE 0.5 % OP SOLN
1.0000 [drp] | Freq: Every day | OPHTHALMIC | Status: DC
  Administered 2020-05-05 – 2020-05-08 (×4): 1 [drp] via OPHTHALMIC
  Filled 2020-05-04 (×3): qty 5

## 2020-05-04 NOTE — ED Triage Notes (Signed)
The pt arrived by  [place] from home  The pt has been c/o with nausea vomiting and diarrhea  Iv lt hand

## 2020-05-04 NOTE — ED Notes (Signed)
Pt to [employer]

## 2020-05-04 NOTE — Plan of Care (Signed)
  Problem: Health Behavior/Discharge Planning: Goal: Ability to manage health-related needs will improve Outcome: Progressing   

## 2020-05-04 NOTE — ED Notes (Signed)
Report given to 67m

## 2020-05-04 NOTE — ED Provider Notes (Signed)
[facility] EMERGENCY DEPARTMENT Provider Note   CSN: [id number] Arrival date & time: 05/04/20  0155     History Chief Complaint  Patient presents with   Abdominal Pain    Amanda [name] is a 79 y.o. female.  The history is provided by the patient, the spouse and medical records. No language interpreter was used.  Abdominal Pain    79 year old female significant history of CAD, skin cancer, liver cirrhosis, diabetes, polycythemia vera, remote history of C. difficile brought in accompanied by husband for evaluation of abdominal pain and generalized weakness.  History was difficult to obtain as patient is a poor historian, some history obtained through husband who is at bedside.  Additional history obtained through prior notes.  Approximately a month ago patient was admitted to the hospital for altered mental status secondary to hypoglycemia.  It was thought that it was due to decreased oral intake with ongoing insulin use.  At that time patient was found to have elevated white count of 39 thought to be stress demargination.  She subsequently was discharged to a rehab facility and stated for approximately 20 days and subsequently came home has been home for approximately 2 weeks.  Since being discharged home, patient has a steady decline in her health.  She is sleeping for most of the day, endorsed loss of appetite, eating much less, having nausea intermittently as well as having abdominal pain.  She is having diarrhea 3-5 times a day with watery or loose stools.  Occasional dry cough and dry heaves.  She has had cirrhosis requiring paracentesis in the past.  She is urinating less but denies any burning sensation when she urinates.  She does not complain of any fever chills or headache.  Has been is worry due to her decline mental status and health prompting this ER visit.  She denies alcohol abuse.  She is up-to-date with her Covid vaccination.  She used to ambulate using a  walker.  She is now mostly staying in bed.  No recent antibiotic use.  No history of active cancer.   Past Medical History:  Diagnosis Date   Allergic rhinitis 01/23/2016   C. difficile colitis    CAD (coronary artery disease)    a. s/p STEMI in 01/2015 with 95% LCx stenosis and distal 80% LCx stenosis (DESx2 placed)   Cancer ([place])    SKIN   Candidiasis of skin 09/30/2014   Cirrhosis ([place])    Depression    Diabetes mellitus 2008   Gout    Herpes simplex    Hyperglycemia 05/31/2013   Hyperlipidemia    Hypertension    Myocardial infarction ([place])    Neuromuscular disorder ([place])    BELL PALSY   Obesity    Polycythemia    Dr. Elease Hashimoto- [facility]   Psoriasis     Patient Active Problem List   Diagnosis Date Noted   Severe diabetic hypoglycemia ([place]) 03/28/2020   Acute hypokalemia 03/28/2020   Hypokalemia    Gastritis and gastroduodenitis    Acute GI bleeding 02/17/2020   Weakness generalized    AVM (arteriovenous malformation) of small bowel, acquired with hemorrhage    Malnutrition of moderate degree 01/16/2020   Goals of care, counseling/discussion    Palliative care by specialist    Nausea & vomiting 01/13/2020   Failure to thrive in adult 01/13/2020   Myeloproliferative disorder ([place]) 01/13/2020   Portal vein thrombosis    SIRS (systemic inflammatory response syndrome) ([place]) 12/28/2019  Worsening lower extremity weakness 12/14/2019   Leg swelling 08/21/2019   Hypotension due to hypovolemia 08/10/2019   CKD (chronic kidney disease), stage III 08/10/2019   Acute blood loss anemia 05/23/2018   Dehydration 03/22/2018   AKI (acute kidney injury) ([place]) 03/22/2018   History of CVA (cerebrovascular accident) 03/22/2018   Hip injury, left, subsequent encounter 11/08/2017   Iron deficiency anemia 12/22/2016   OSA (obstructive sleep apnea) 04/21/2016   Osteopenia 04/07/2016   Lung nodule, solitary 02/05/2016   Aortic  dilatation ([place]) 02/05/2016   Dyspnea 01/28/2016   Cirrhosis ([place]) 01/28/2016   Allergic rhinitis 01/23/2016   Hepatic cirrhosis ([place]) 10/28/2015   Irritable bowel syndrome 08/12/2015   Obesity (BMI 30-39.9) 04/30/2015   Diabetic retinopathy of both eyes ([place]) 04/18/2015   Former smoker 04/18/2015   GAD (generalized anxiety disorder) 04/18/2015   Status post primary angioplasty with coronary stent 04/18/2015   Coronary artery disease involving native coronary artery 03/01/2015   Chronic diastolic CHF (congestive heart failure) ([place]) 02/01/2015   ST elevation myocardial infarction (STEMI) involving left circumflex coronary artery in recovery phase ([place]) 01/27/2015   Idioventricular rhythm ([place])    Diabetic peripheral neuropathy associated with type 2 diabetes mellitus ([place]) 01/23/2015   Type 2 diabetes mellitus with diabetic polyneuropathy ([place]) 01/23/2015   Thrombocytosis ([place]) 07/04/2014   Cholelithiasis 02/07/2014   Chronic diarrhea 01/29/2014   Leukocytosis 06/02/2013   [name] ([place]) 05/31/2013   Essential hypertension 05/31/2013   History of Bell's palsy 05/26/2013   DERMATITIS, ATOPIC 11/16/2010   DIZZINESS 73/53/2992   DIASTOLIC DYSFUNCTION 42/68/3419   Hyperlipidemia 12/15/2009   Essential hypertension, benign 11/24/2009   PSORIASIS 11/24/2009   Depression 09/13/2009   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[place]) 07/08/2008    Past Surgical History:  Procedure Laterality Date   ANKLE FRACTURE SURGERY     BIOPSY  05/26/2018   Procedure: BIOPSY;  Surgeon: [name], MD;  [facility] END[facility]COPY;  Service: Endoscopy;;   BIOPSY  02/19/2020   Procedure: BIOPSY;  Surgeon: Thornton Park, MD;  Location: [place];  Service: Gastroenterology;;   BREAST SURGERY Left    milk duct   CATARACT EXTRACTION, BILATERAL     COLONOSCOPY     ENTEROSCOPY N/A 02/19/2020   Procedure: ENTEROSCOPY;  Surgeon: Thornton Park,  MD;  Location: [place];  Service: Gastroenterology;  Laterality: N/A;   ESOPHAGOGASTRODUODENOSCOPY N/A 05/26/2018   Procedure: ESOPHAGOGASTRODUODENOSCOPY (EGD);  Surgeon: [name], MD;  Location[facility] ENDOSCOPY;  Service: Endoscopy;  Laterality: N/A;   ESOPHAGOGASTRODUODENOSCOPY (EGD) WITH PROPOFOL N/A 01/21/2020   Procedure: ESOPHAGOGASTRODUODENOSCOPY (EGD) WITH PROPOFOL;  Surgeon: Yetta Flock, MD;  Location: [place];  Service: Gastroenterology;  Laterality: N/A;   HEMOSTASIS CLIP PLACEMENT  01/21/2020   Procedure: HEMOSTASIS CLIP PLACEMENT;  Surgeon: Yetta Flock, MD;  Location: [place] ENDOSCOPY;  Service: Gastroenterology;;   HOT HEMOSTASIS N/A 01/21/2020   Procedure: HOT HEMOSTASIS (ARGON PLASMA COAGULATION/BICAP);  Surgeon: Yetta Flock, MD;  Location: [facility] ENDOSCOPY;  Service: Gastroenterology;  Laterality: N/A;   HOT HEMOSTASIS N/A 02/19/2020   Procedure: HOT HEMOSTASIS (ARGON PLASMA COAGULATION/BICAP);  Surgeon: Thornton Park, MD;  Location: [place];  Service: Gastroenterology;  Laterality: N/A;   IR IMAGING GUIDED PORT INSERTION  08/30/2019   IR PARACENTESIS  05/25/2018   LEFT HEART CATHETERIZATION WITH CORONARY ANGIOGRAM N/A 01/27/2015   Procedure: LEFT HEART CATHETERIZATION WITH CORONARY ANGIOGRAM;  Surgeon: [name], MD;  Location: [facility] CATH LAB;  Service: Cardiovascular;  Laterality: N/A;   TONSILLECTOMY  TOTAL ABDOMINAL HYSTERECTOMY W/ BILATERAL SALPINGOOPHORECTOMY     for heavy periods with appendectomy     OB History   No obstetric history on file.     Family History  Problem Relation Age of Onset   Diabetes Mother    Heart disease Mother        CAD   Hyperlipidemia Mother    Hypertension Mother    Kidney disease Mother    Kidney disease Father    Breast cancer Maternal Aunt    Heart disease Maternal Grandmother    Heart disease Other        maternal aunts and uncles   Colon cancer Neg Hx    Esophageal  cancer Neg Hx     Social History   Tobacco Use   Smoking status: Former Smoker    Years: 48.00    Types: Cigarettes    Quit date: 10/18/1986    Years since quitting: 33.5   Smokeless tobacco: Never Used  Vaping Use   Vaping Use: Never used  Substance Use Topics   Alcohol use: No    Alcohol/week: 0.0 standard drinks   Drug use: No    Home Medications Prior to Admission medications   Medication Sig Start Date End Date Taking? Authorizing Provider  Blood Glucose Monitoring Suppl (ACCU-CHEK GUIDE) w/Device KIT 1 kit by Does not apply route as directed. 01/25/18   [name], MD  citalopram (CELEXA) 20 MG tablet Take 1 tablet (20 mg total) by mouth daily. 04/15/20 07/14/20  Nafziger, Tommi Rumps, NP  clopidogrel (PLAVIX) 75 MG tablet Take 75 mg by mouth daily.  02/29/20   [provider]  Continuous Blood Gluc Receiver (FREESTYLE LIBRE 2 READER) DEVI For continuous blood glucose monitoring. E11.42 04/18/20   [name], MD  Continuous Blood Gluc Sensor (FREESTYLE LIBRE 2 SENSOR) MISC For continuous blood glucose monitoring. E11.42 04/18/20   [name], MD  diphenoxylate-atropine (LOMOTIL) 2.5-0.025 MG tablet Take 1 tablet by mouth 4 (four) times daily as needed for diarrhea or loose stools. 04/15/20   Nafziger, Tommi Rumps, NP  ezetimibe (ZETIA) 10 MG tablet Take 1 tablet (10 mg total) by mouth daily. 02/13/20   [name], MD  furosemide (LASIX) 40 MG tablet Take 0.5 tablets (20 mg total) by mouth daily. 04/02/20   Ghimire, Henreitta Leber, MD  glucose blood (ACCU-CHEK GUIDE) test strip Use to check blood sugars 3 times daily 01/25/18   [name], MD  insulin aspart (NOVOLOG) 100 UNIT/ML FlexPen 0-9 Units, Subcutaneous, 3 times daily with meals CBG < 70: Implement Hypoglycemia measures, Call primary MD CBG 70 - 120: 0 units CBG 121 - 150: 1 unit CBG 151 - 200: 2 units CBG 201 - 250: 3 units CBG 251 - 300: 5 units CBG 301 - 350: 7 units CBG 351 - 400: 9 units CBG > 400: call  primary MD 04/02/20   Jonetta Osgood, MD  insulin glargine (LANTUS SOLOSTAR) 100 UNIT/ML Solostar Pen Inject 16 Units into the skin daily. 04/07/20   Shamleffer, Melanie Crazier, MD  Insulin Pen Needle 32G X 4 MM MISC 1 Device by Does not apply route in the morning, at noon, in the evening, and at bedtime. 04/07/20   Shamleffer, Melanie Crazier, MD  latanoprost (XALATAN) 0.005 % ophthalmic solution Place 1 drop into both eyes at bedtime.  03/23/19   [provider]  lidocaine-prilocaine (EMLA) cream Apply 1 application topically as needed. Please apply EMLA over the Port-A-Cath site 1 hour prior to  coming to our office. Patient taking differently: Apply 1 application topically daily as needed (for port).  08/17/19   Volanda Napoleon, MD  loperamide (IMODIUM A-D) 2 MG tablet Take 2 mg by mouth 4 (four) times daily as needed for diarrhea or loose stools.    [provider]  meclizine (ANTIVERT) 25 MG tablet TAKE 1 TABLET(25 MG) BY MOUTH THREE TIMES DAILY AS NEEDED FOR DIZZINESS 04/30/20   Nafziger, Tommi Rumps, NP  mirtazapine (REMERON) 15 MG tablet Take 1 tablet (15 mg total) by mouth at bedtime. 08/17/19   Nafziger, Tommi Rumps, NP  ondansetron (ZOFRAN ODT) 4 MG disintegrating tablet Take 1 tablet (4 mg total) by mouth every 8 (eight) hours as needed. 05/02/20   Nafziger, Tommi Rumps, NP  ONETOUCH VERIO test strip USE TO TEST BLOOD SUGAR THREE TIMES DAILY 05/01/18   [name], MD  pantoprazole (PROTONIX) 40 MG tablet Take 1 tablet (40 mg total) by mouth 2 (two) times daily before a meal. 03/18/20 06/16/20  Nafziger, Tommi Rumps, NP  spironolactone (ALDACTONE) 25 MG tablet Take 0.5 tablets (12.5 mg total) by mouth daily. 01/25/20   Lavina Hamman, MD  timolol (TIMOPTIC) 0.5 % ophthalmic solution Place 1 drop into both eyes daily.  10/07/19   [provider]  triamcinolone cream (KENALOG) 0.1 % Apply 1 application topically 2 (two) times daily. Patient taking differently: Apply 1 application topically 2  (two) times daily as needed (for rask skin).  12/11/19   Nafziger, Tommi Rumps, NP  XIFAXAN 550 MG TABS tablet Take 1 tablet (550 mg total) by mouth 2 (two) times daily. 04/08/20 07/07/20  Milus Banister, MD    Allergies    Doxycycline, Penicillins, Lisinopril, Tape, and Latex  Review of Systems   Review of Systems  Unable to perform ROS: Mental status change  Gastrointestinal: Positive for abdominal pain.  All other systems reviewed and are negative.   Physical Exam Updated Vital Signs BP 128/88 (BP Location: Left Arm)    Pulse (!) 112    Temp 97.6 F (36.4 C) (Oral)    Resp 16    SpO2 95%   Physical Exam Vitals and nursing note reviewed.  Constitutional:      General: She is not in acute distress.    Appearance: She is well-developed. She is obese.     Comments: Ill-appearing obese female laying in bed appears to be in no acute discomfort.  HENT:     Head: Atraumatic.  Eyes:     Conjunctiva/sclera: Conjunctivae normal.     Comments: Left conjunctiva is injected  Cardiovascular:     Rate and Rhythm: Tachycardia present.  Pulmonary:     Effort: Pulmonary effort is normal.     Breath sounds: No wheezing, rhonchi or rales.  Abdominal:     General: Abdomen is protuberant. Bowel sounds are normal.     Palpations: Abdomen is soft.     Tenderness: There is abdominal tenderness (Very minimal diffuse tenderness on abdominal exam without focal point tenderness.).  Musculoskeletal:     Cervical back: Neck supple.     Comments: Global weakness but able to move all 4 extremities with equal strength.  Skin:    Findings: No rash.  Neurological:     Mental Status: She is alert.     GCS: GCS eye subscore is 4. GCS verbal subscore is 4. GCS motor subscore is 6.     Comments: Is alert to self and situation but not to place, time or current event.  ED Results / Procedures / Treatments   Labs (all labs ordered are listed, but only abnormal results are displayed) Labs Reviewed   COMPREHENSIVE METABOLIC PANEL - Abnormal; Notable for the following components:      Result Value   Sodium 134 (*)    Potassium 3.0 (*)    CO2 17 (*)    Glucose, Bld 158 (*)    Creatinine, Ser 1.43 (*)    Calcium 8.6 (*)    Total Protein 6.3 (*)    Albumin 2.8 (*)    Alkaline Phosphatase 160 (*)    Total Bilirubin 2.6 (*)    GFR calc non Af Amer 35 (*)    GFR calc Af Amer 40 (*)    All other components within normal limits  CBC - Abnormal; Notable for the following components:   WBC 45.8 (*)    RDW 22.2 (*)    Platelets 566 (*)    All other components within normal limits  URINALYSIS, ROUTINE W REFLEX MICROSCOPIC - Abnormal; Notable for the following components:   Color, Urine AMBER (*)    APPearance HAZY (*)    Specific Gravity, Urine >1.046 (*)    Leukocytes,Ua SMALL (*)    Bacteria, UA RARE (*)    All other components within normal limits  CBC WITH DIFFERENTIAL/PLATELET - Abnormal; Notable for the following components:   WBC 55.0 (*)    RDW 22.3 (*)    Platelets 542 (*)    Neutro Abs 52.8 (*)    nRBC 1 (*)    All other components within normal limits  AMMONIA - Abnormal; Notable for the following components:   Ammonia 64 (*)    All other components within normal limits  C DIFFICILE QUICK SCREEN W PCR REFLEX  SARS CORONAVIRUS 2 BY RT PCR (HOSPITAL ORDER, [facility])  LIPASE, BLOOD    EKG EKG Interpretation  Date/Time:  Sunday May 04 2020 01:59:13 EDT Ventricular Rate:  112 PR Interval:  142 QRS Duration: 92 QT Interval:  372 QTC Calculation: 507 R Axis:   -38 Text Interpretation: Sinus tachycardia with occasional Premature ventricular complexes Left axis deviation Low voltage QRS Possible Anterolateral infarct , age undetermined Abnormal ECG When compared with ECG of 03/28/2020, No significant change was found Confirmed by Delora Fuel ([id number]) on 05/04/2020 2:17:03 AM   Radiology CT ABDOMEN PELVIS W CONTRAST  Result Date:  05/04/2020 CLINICAL DATA:  Generalized abdominal pain, nausea, vomiting. EXAM: CT ABDOMEN AND PELVIS WITH CONTRAST TECHNIQUE: Multidetector CT imaging of the abdomen and pelvis was performed using the standard protocol following bolus administration of intravenous contrast. CONTRAST:  19m OMNIPAQUE IOHEXOL 300 MG/ML  SOLN COMPARISON:  Feb 20, 2020. FINDINGS: Lower chest: No acute abnormality. Hepatobiliary: Findings consistent with hepatic cirrhosis. Minimal cholelithiasis is noted. No biliary dilatation is noted. Stable heterogeneity of hepatic parenchyma is noted without a dominant focal lesion. Pancreas: Unremarkable. No pancreatic ductal dilatation or surrounding inflammatory changes. Spleen: Mild splenomegaly is noted. Adrenals/Urinary Tract: Adrenal glands are unremarkable. Kidneys are normal, without renal calculi, focal lesion, or hydronephrosis. Bladder is unremarkable. Stomach/Bowel: The stomach appears normal. There is no evidence of bowel obstruction or inflammation. The appendix is not visualized, but no inflammation is noted in the right lower quadrant. Vascular/Lymphatic: Aortic atherosclerosis. No enlarged abdominal or pelvic lymph nodes. Reproductive: Uterus and bilateral adnexa are unremarkable. Other: Mild ascites is noted, including around the liver and spleen. No definite hernia is noted. Musculoskeletal: No acute or significant  osseous findings. IMPRESSION: 1. Findings consistent with hepatic cirrhosis with mild splenomegaly and mild ascites. 2. Minimal cholelithiasis. Aortic Atherosclerosis (ICD10-I70.0). Electronically Signed   By: Marijo Conception M.D.   On: 05/04/2020 10:15    Procedures Procedures (including critical care time)  Medications Ordered in ED Medications  potassium chloride SA (KLOR-CON) CR tablet 40 mEq (has no administration in time range)  potassium chloride 10 mEq in 100 mL IVPB (has no administration in time range)  sodium chloride flush (NS) 0.9 % injection 3 mL  (3 mLs Intravenous Given 05/04/20 0938)  sodium chloride 0.9 % bolus 1,000 mL (1,000 mLs Intravenous New Bag/Given 05/04/20 0935)  ondansetron (ZOFRAN) injection 4 mg (4 mg Intravenous Given 05/04/20 0939)  morphine 4 MG/ML injection 4 mg (4 mg Intravenous Given 05/04/20 0939)  iohexol (OMNIPAQUE) 300 MG/ML solution 80 mL (80 mLs Intravenous Contrast Given 05/04/20 0910)    ED Course  I have reviewed the triage vital signs and the nursing notes.  Pertinent labs & imaging results that were available during my care of the patient were reviewed by me and considered in my medical decision making (see chart for details).  Clinical Course as of May 05 1155  Sun May 04, 2020  1149 CT scan shows mild ascites and without a clear source of infection for elevated WBC count. Complete skin exam reveals no pressure wounds for possible infectious source either. Admitting team consulted for inpt admission for elevated WBC count, elevated ammonia, and workup elevated WBC count for possible malignancy.    [AS]    Clinical Course User Index [AS] Sagun, Amelia, Student-PA   MDM Rules/Calculators/A&P                          BP (!) 127/40    Pulse 81    Temp (!) 97.2 F (36.2 C) (Axillary)    Resp 12    Ht _0  (1.626 m)    Wt 90.7 kg    SpO2 94%    BMI 34.33 kg/m   Final Clinical Impression(s) / ED Diagnoses Final diagnoses:  Diarrhea of presumed infectious origin  Generalized abdominal pain  Leukocytosis, unspecified type  Delirium    Rx / DC Orders ED Discharge Orders    None     9:02 AM This is an elderly female presenting for complaints of abdominal pain, increased generalized weakness as well as increased confusion which has been ongoing for the past several weeks.  Previously admitted to the hospital for hypoglycemia in the setting of decreased oral intake while being on insulin.  Today her CBG is 158.  Labs remarkable for hypokalemia with a potassium of 3.0, will give supplementation.   Evidence of AKI with a creatinine of 1.43, IV fluids started.  Hypoalbuminemia with an albumin of 2.8 likely contributed to some of edema.  Elevated alk phos of 160 and total bili of 2.6.  She has an elevated white count of 45.8.  She has had elevated white count during her last hospitalization which was thought to be due to stress demargination.  She does have remote history of C. difficile which can also contribute to elevated white count.  11:38 AM Abdominal pelvis CT scan demonstrate finding consistent with hepatic cirrhosis with mild splenomegaly and mild ascites.  Minimal cholelithiasis.  Repeat CBG showing worsening leukocytosis, now at 55.  UA obtained shows 21-50 WBC and small leukocyte esterase but nitrite negative.  Ammonia level is 64.  11:56 AM In the setting of abdominal pain, diarrhea with progressive leukocytosis, C. difficile sent..  It has not been collected yet.  Portable chest x-ray ordered but currently pending.  No obvious source of infection was identified.  No signs of skin infection.  I have consulted [facility] hospitalist, Dr. Lorin Mercy who agrees to admit patient for further management of her condition.  Patient is in agreement with plan.  Covid-19 screening test ordered.  Care discussed with Dr. Tamera Punt.    BP (!) 127/40    Pulse 81    Temp (!) 97.2 F (36.2 C) (Axillary)    Resp 12    Ht _0  (1.626 m)    Wt 90.7 kg    SpO2 94%    BMI 34.33 kg/m   Amanda [name] was evaluated in Emergency Department on 05/04/2020 for the symptoms described in the history of present illness. She was evaluated in the context of the global COVID-19 pandemic, which necessitated consideration that the patient might be at risk for infection with the SARS-CoV-2 virus that causes COVID-19. Institutional protocols and algorithms that pertain to the evaluation of patients at risk for COVID-19 are in a state of rapid change based on information released by regulatory bodies including the CDC and federal and state  organizations. These policies and algorithms were followed during the patient's care in the ED.    Domenic Moras, PA-C 05/04/20 1159    Malvin Johns, MD 05/19/20 [phone]

## 2020-05-04 NOTE — Procedures (Signed)
°  US guided LLQ paracentesis  3.3L dark yellow fluid Sent for labs per MD  Tolerated well

## 2020-05-04 NOTE — H&P (Signed)
History and Physical    Amanda [name]:[id number] DOB: Oct 02, 1941 DOA: 05/04/2020  PCP: [name], NP Consultants:  Cruzita Lederer - endocrinology; Vevelyn Francois - oncology Patient coming from:  Home - lives with husband of 81 years; NOK: Husband, [phone]  Chief Complaint: N/V/D  HPI: Amanda [name] is a 79 y.o. female with medical history significant of polycythemia; CAD s/p stents; HTN; HLD; DM; obesity; and cirrhosis presenting with n/v/d.  She reports that she couldn't breathe and hasn't felt well.  Like the "whole world was caving in on me."  This has been going on for a couple of days.  Her husband reports that she came home from SNF and wasn't doing well then - in bed, still couldn't walk right, was eating and drinking ok.  About 3 weeks ago, she stopped eating at all, although she is drinking.  She has been sleeping all day.  No change in SOB.  +cough and dry heaves x 1 -2 weeks.  Diarrhea is chronic for her, she's "had all diarrhea since she came home" and "it's been oily".  No further concern for GI bleeding.  No abdominal pain according to the patient but the patient said she has had bad cramps for the last 3 days.  Her ammonia is up so she might have been somewhat confused.  No fever. ?losing weight - usually 138-140.  No night sweats.  Mild dysuria.  Very itchy on back and arms.   ED Course:  N/V/D, marked leukocytosis.  Similar presentation 1 month ago - then with hypoglycemia, stayed in rehab x 2 weeks.  Dwindling since d/c from SNF.  C. Diff pending, no stool yet.  Review of Systems: As per HPI; otherwise review of systems reviewed and negative.   Ambulatory Status:  Currently non-ambulatory, last walked maybe 4-5 months ago with a walker  COVID Vaccine Status:   Complete  Past Medical History:  Diagnosis Date  . Allergic rhinitis 01/23/2016  . C. difficile colitis   . CAD (coronary artery disease)    a. s/p STEMI in 01/2015 with 95% LCx stenosis and distal 80%  LCx stenosis (DESx2 placed)  . Cancer ([place])    SKIN  . Cirrhosis ([place])    from fatty liver  . Depression   . Diabetes mellitus 2008  . Gout   . Herpes simplex   . Hyperlipidemia   . Hypertension   . Obesity   . Polycythemia    Dr. Elease Hashimoto- [facility]  . Psoriasis     Past Surgical History:  Procedure Laterality Date  . ANKLE FRACTURE SURGERY    . BIOPSY  05/26/2018   Procedure: BIOPSY;  Surgeon: [name], MD;  Location[facility] ENDOSCOPY;  Service: Endoscopy;;  . BIOPSY  02/19/2020   Procedure: BIOPSY;  Surgeon: Thornton Park, MD;  Location: [place];  Service: Gastroenterology;;  . BREAST SURGERY Left    milk duct  . CATARACT EXTRACTION, BILATERAL    . COLONOSCOPY    . ENTEROSCOPY N/A 02/19/2020   Procedure: ENTEROSCOPY;  Surgeon: Thornton Park, MD;  Location: [place];  Service: Gastroenterology;  Laterality: N/A;  . ESOPHAGOGASTRODUODENOSCOPY N/A 05/26/2018   Procedure: ESOPHAGOGASTRODUODENOSCOPY (EGD);  Surgeon: [name], MD;  Location: [facility] ENDOSCOPY;  Service: Endoscopy;  Laterality: N/A;  . ESOPHAGOGASTRODUODENOSCOPY (EGD) WITH PROPOFOL N/A 01/21/2020   Procedure: ESOPHAGOGASTRODUODENOSCOPY (EGD) WITH PROPOFOL;  Surgeon: Yetta Flock, MD;  Location: [place];  Service: Gastroenterology;  Laterality: N/A;  . HEMOSTASIS CLIP PLACEMENT  01/21/2020  Procedure: HEMOSTASIS CLIP PLACEMENT;  Surgeon: Yetta Flock, MD;  Location: [facility] ENDOSCOPY;  Service: Gastroenterology;;  . HOT HEMOSTASIS N/A 01/21/2020   Procedure: HOT HEMOSTASIS (ARGON PLASMA COAGULATION/BICAP);  Surgeon: Yetta Flock, MD;  Location: [facility] ENDOSCOPY;  Service: Gastroenterology;  Laterality: N/A;  . HOT HEMOSTASIS N/A 02/19/2020   Procedure: HOT HEMOSTASIS (ARGON PLASMA COAGULATION/BICAP);  Surgeon: Thornton Park, MD;  Location: [place];  Service: Gastroenterology;  Laterality: N/A;  . IR IMAGING GUIDED PORT INSERTION  08/30/2019  . IR PARACENTESIS  05/25/2018  . LEFT  HEART CATHETERIZATION WITH CORONARY ANGIOGRAM N/A 01/27/2015   Procedure: LEFT HEART CATHETERIZATION WITH CORONARY ANGIOGRAM;  Surgeon: [name], MD;  Location: [facility] CATH LAB;  Service: Cardiovascular;  Laterality: N/A;  . TONSILLECTOMY    . TOTAL ABDOMINAL HYSTERECTOMY W/ BILATERAL SALPINGOOPHORECTOMY     for heavy periods with appendectomy    Social History   Socioeconomic History  . Marital status: Married    Spouse name: [name]  . Number of children: 2  . Years of education: 2 yr colle  . Highest education level: Not on file  Occupational History  . Occupation: housewife  Tobacco Use  . Smoking status: Former Smoker    Years: 48.00    Types: Cigarettes    Quit date: 10/18/1986    Years since quitting: 33.5  . Smokeless tobacco: Never Used  Vaping Use  . Vaping Use: Never used  Substance and Sexual Activity  . Alcohol use: No    Alcohol/week: 0.0 standard drinks  . Drug use: No  . Sexual activity: Yes    Partners: Male    Birth control/protection: None  Other Topics Concern  . Not on file  Social History Narrative   2 caffeine drink per day.  No regular exercise.     Retired from the bank   2 children (Daughter and a son) son has morbid obesity   2 grandchildren   3 great grandchildren   Social Determinants of Radio broadcast assistant Strain:   . Difficulty of Paying Living Expenses:   Food Insecurity:   . Worried About Charity fundraiser in the Last Year:   . Arboriculturist in the Last Year:   Transportation Needs:   . Film/video editor (Medical):   Marland Kitchen Lack of Transportation (Non-Medical):   Physical Activity:   . Days of Exercise per Week:   . Minutes of Exercise per Session:   Stress:   . Feeling of Stress :   Social Connections:   . Frequency of Communication with Friends and Family:   . Frequency of Social Gatherings with Friends and Family:   . Attends Religious Services:   . Active Member of Clubs or Organizations:   . Attends Theatre manager Meetings:   Marland Kitchen Marital Status:   Intimate Partner Violence:   . Fear of Current or Ex-Partner:   . Emotionally Abused:   Marland Kitchen Physically Abused:   . Sexually Abused:     Allergies  Allergen Reactions  . Doxycycline Hives, Swelling and Rash  . Penicillins Swelling and Other (See Comments)    Face swells Has patient had a PCN reaction causing immediate rash, facial/tongue/throat swelling, SOB or lightheadedness with hypotension: Yes Has patient had a PCN reaction causing severe rash involving mucus membranes or skin necrosis: No Has patient had a PCN reaction that required hospitalization: No Has patient had a PCN reaction occurring within the last 10 years: No If all  of the above answers are "NO", then may proceed with Cephalosporin use.  Marland Kitchen Lisinopril Cough  . Tape Hives  . Latex Hives, Itching and Rash    Family History  Problem Relation Age of Onset  . Diabetes Mother   . Heart disease Mother        CAD  . Hyperlipidemia Mother   . Hypertension Mother   . Kidney disease Mother   . Kidney disease Father   . Breast cancer Maternal Aunt   . Heart disease Maternal Grandmother   . Heart disease Other        maternal aunts and uncles  . Colon cancer Neg Hx   . Esophageal cancer Neg Hx     Prior to Admission medications   Medication Sig Start Date End Date Taking? Authorizing Provider  Blood Glucose Monitoring Suppl (ACCU-CHEK GUIDE) w/Device KIT 1 kit by Does not apply route as directed. 01/25/18   [name], MD  citalopram (CELEXA) 20 MG tablet Take 1 tablet (20 mg total) by mouth daily. 04/15/20 07/14/20  Nafziger, Tommi Rumps, NP  clopidogrel (PLAVIX) 75 MG tablet Take 75 mg by mouth daily.  02/29/20   [provider]  Continuous Blood Gluc Receiver (FREESTYLE LIBRE 2 READER) DEVI For continuous blood glucose monitoring. E11.42 04/18/20   [name], MD  Continuous Blood Gluc Sensor (FREESTYLE LIBRE 2 SENSOR) MISC For continuous blood glucose  monitoring. E11.42 04/18/20   [name], MD  diphenoxylate-atropine (LOMOTIL) 2.5-0.025 MG tablet Take 1 tablet by mouth 4 (four) times daily as needed for diarrhea or loose stools. 04/15/20   Nafziger, Tommi Rumps, NP  ezetimibe (ZETIA) 10 MG tablet Take 1 tablet (10 mg total) by mouth daily. 02/13/20   [name], MD  furosemide (LASIX) 40 MG tablet Take 0.5 tablets (20 mg total) by mouth daily. 04/02/20   Ghimire, Henreitta Leber, MD  glucose blood (ACCU-CHEK GUIDE) test strip Use to check blood sugars 3 times daily 01/25/18   [name], MD  insulin aspart (NOVOLOG) 100 UNIT/ML FlexPen 0-9 Units, Subcutaneous, 3 times daily with meals CBG < 70: Implement Hypoglycemia measures, Call primary MD CBG 70 - 120: 0 units CBG 121 - 150: 1 unit CBG 151 - 200: 2 units CBG 201 - 250: 3 units CBG 251 - 300: 5 units CBG 301 - 350: 7 units CBG 351 - 400: 9 units CBG > 400: call primary MD 04/02/20   Jonetta Osgood, MD  insulin glargine (LANTUS SOLOSTAR) 100 UNIT/ML Solostar Pen Inject 16 Units into the skin daily. 04/07/20   Shamleffer, Melanie Crazier, MD  Insulin Pen Needle 32G X 4 MM MISC 1 Device by Does not apply route in the morning, at noon, in the evening, and at bedtime. 04/07/20   Shamleffer, Melanie Crazier, MD  latanoprost (XALATAN) 0.005 % ophthalmic solution Place 1 drop into both eyes at bedtime.  03/23/19   [provider]  lidocaine-prilocaine (EMLA) cream Apply 1 application topically as needed. Please apply EMLA over the Port-A-Cath site 1 hour prior to coming to our office. Patient taking differently: Apply 1 application topically daily as needed (for port).  08/17/19   Volanda Napoleon, MD  loperamide (IMODIUM A-D) 2 MG tablet Take 2 mg by mouth 4 (four) times daily as needed for diarrhea or loose stools.    [provider]  meclizine (ANTIVERT) 25 MG tablet TAKE 1 TABLET(25 MG) BY MOUTH THREE TIMES DAILY AS NEEDED FOR DIZZINESS 04/30/20   [name], NP  mirtazapine  (REMERON) 15 MG tablet Take 1 tablet (15 mg total) by mouth at bedtime. 08/17/19   Nafziger, Tommi Rumps, NP  ondansetron (ZOFRAN ODT) 4 MG disintegrating tablet Take 1 tablet (4 mg total) by mouth every 8 (eight) hours as needed. 05/02/20   Nafziger, Tommi Rumps, NP  ONETOUCH VERIO test strip USE TO TEST BLOOD SUGAR THREE TIMES DAILY 05/01/18   [name], MD  pantoprazole (PROTONIX) 40 MG tablet Take 1 tablet (40 mg total) by mouth 2 (two) times daily before a meal. 03/18/20 06/16/20  Nafziger, Tommi Rumps, NP  spironolactone (ALDACTONE) 25 MG tablet Take 0.5 tablets (12.5 mg total) by mouth daily. 01/25/20   Lavina Hamman, MD  timolol (TIMOPTIC) 0.5 % ophthalmic solution Place 1 drop into both eyes daily.  10/07/19   [provider]  triamcinolone cream (KENALOG) 0.1 % Apply 1 application topically 2 (two) times daily. Patient taking differently: Apply 1 application topically 2 (two) times daily as needed (for rask skin).  12/11/19   Nafziger, Tommi Rumps, NP  XIFAXAN 550 MG TABS tablet Take 1 tablet (550 mg total) by mouth 2 (two) times daily. 04/08/20 07/07/20  Milus Banister, MD    Physical Exam: Vitals:   05/04/20 1425 05/04/20 1430 05/04/20 1654 05/04/20 1740  BP: (!) 116/53 108/73 128/65 132/83  Pulse:   81 83  Resp:   18 18  Temp:   97.9 F (36.6 C) (!) 97.5 F (36.4 C)  TempSrc:   Oral Oral  SpO2:   95% 93%  Weight:      Height:         . General:  Appears calm and comfortable and is NAD . Eyes:  PERRL, EOMI, normal lids, iris . ENT:  grossly normal hearing, lips & tongue, mmm; appropriate dentition . Neck:  no LAD, masses or thyromegaly; no carotid bruits . Cardiovascular:  RRR, no m/r/g. No LE edema.  Marland Kitchen Respiratory:   CTA bilaterally with no wheezes/rales/rhonchi.  Normal respiratory effort. . Abdomen:  soft, NT, ND, NABS . Back:   normal alignment, no CVAT . Skin:  no rash or induration seen on limited exam . Musculoskeletal:  grossly normal tone BUE/BLE, good ROM, no bony  abnormality . Lower extremity:  No LE edema.  Limited foot exam with no ulcerations.  2+ distal pulses. Marland Kitchen Psychiatric:  grossly normal mood and affect, speech fluent and appropriate, AOx3 . Neurologic:  CN 2-12 grossly intact, moves all extremities in coordinated fashion, sensation intact    Radiological Exams on Admission: CT ABDOMEN PELVIS W CONTRAST  Result Date: 05/04/2020 CLINICAL DATA:  Generalized abdominal pain, nausea, vomiting. EXAM: CT ABDOMEN AND PELVIS WITH CONTRAST TECHNIQUE: Multidetector CT imaging of the abdomen and pelvis was performed using the standard protocol following bolus administration of intravenous contrast. CONTRAST:  3m OMNIPAQUE IOHEXOL 300 MG/ML  SOLN COMPARISON:  Feb 20, 2020. FINDINGS: Lower chest: No acute abnormality. Hepatobiliary: Findings consistent with hepatic cirrhosis. Minimal cholelithiasis is noted. No biliary dilatation is noted. Stable heterogeneity of hepatic parenchyma is noted without a dominant focal lesion. Pancreas: Unremarkable. No pancreatic ductal dilatation or surrounding inflammatory changes. Spleen: Mild splenomegaly is noted. Adrenals/Urinary Tract: Adrenal glands are unremarkable. Kidneys are normal, without renal calculi, focal lesion, or hydronephrosis. Bladder is unremarkable. Stomach/Bowel: The stomach appears normal. There is no evidence of bowel obstruction or inflammation. The appendix is not visualized, but no inflammation is noted in the right lower quadrant. Vascular/Lymphatic: Aortic atherosclerosis. No enlarged abdominal or pelvic lymph nodes. Reproductive: Uterus  and bilateral adnexa are unremarkable. Other: Mild ascites is noted, including around the liver and spleen. No definite hernia is noted. Musculoskeletal: No acute or significant osseous findings. IMPRESSION: 1. Findings consistent with hepatic cirrhosis with mild splenomegaly and mild ascites. 2. Minimal cholelithiasis. Aortic Atherosclerosis (ICD10-I70.0). Electronically  Signed   By: Marijo Conception M.D.   On: 05/04/2020 10:15   US Paracentesis  Result Date: 05/04/2020 INDICATION: Recurrent ascites NASH Cirrhosis EXAM: ULTRASOUND GUIDED LLQ PARACENTESIS MEDICATIONS: 10 cc 1% lidocaine COMPLICATIONS: None immediate. PROCEDURE: Informed written consent was obtained from the patient after a discussion of the risks, benefits and alternatives to treatment. A timeout was performed prior to the initiation of the procedure. Initial ultrasound scanning demonstrates a large amount of ascites within the left lower abdominal quadrant. The left lower abdomen was prepped and draped in the usual sterile fashion. 1% lidocaine was used for local anesthesia. Following this, a 68 G Yueh catheter was introduced. An ultrasound image was saved for documentation purposes. The paracentesis was performed. The catheter was removed and a dressing was applied. The patient tolerated the procedure well without immediate post procedural complication. Patient received post-procedure intravenous albumin; see nursing notes for details. FINDINGS: A total of approximately 3.3 Liters of dark yellow fluid was removed. Samples were sent to the laboratory as requested by the clinical team. IMPRESSION: Successful ultrasound-guided paracentesis yielding 3.3 liters of peritoneal fluid. Read by Lavonia Drafts [facility] Electronically Signed   By: Corrie Mckusick D.O.   On: 05/04/2020 14:43   DG Chest Portable 1 View  Result Date: 05/04/2020 CLINICAL DATA:  Weakness.  Nausea, vomiting and diarrhea. EXAM: PORTABLE CHEST 1 VIEW COMPARISON:  03/28/2020 FINDINGS: Right jugular porta catheter tip in the right atrium approximately 4 cm inferior to the superior cavoatrial junction. Stable enlarged cardiac silhouette. Poor inspiration with mild bibasilar atelectasis. Diffuse osteopenia. IMPRESSION: 1. Poor inspiration with mild bibasilar atelectasis. 2. Stable cardiomegaly. Electronically Signed   By: Claudie Revering M.D.   On:  05/04/2020 12:42    EKG: Independently reviewed.  Sinus tachycardia with rate 112; prolonged QTc 507; nonspecific ST changes with NSCSLT   Labs on Admission: I have personally reviewed the available labs and imaging studies at the time of the admission.  Pertinent labs:   K+ 3.0 CO2 17 Glucose 158 BUN 17/Creatinine 1.43/GFR 35 - stable AP 160 Albumin 2.8 NH4 64 Bili 2.6 WBC 45.8 -> 55.0 Platelets 566 -> 542 UA: small LE, rare bacteria   Assessment/Plan Principal Problem:   Failure to thrive in adult Active Problems:   Hyperlipidemia   [name] (HCC)   Essential hypertension   Leukocytosis   Obesity (BMI 30-39.9)   Hepatic cirrhosis (HCC)   Chronic diarrhea   Type 2 diabetes mellitus with diabetic polyneuropathy (HCC)   Stage 3b chronic kidney disease   Failure to thrive -Patient was previously hospitalized from 6/11-16 for severe weakness, thought to be due to hypoglycemia -She was discharged to SNF and has really been failing since - although worsening -At the time of my evaluation, she appeared to have dementia - she was conversational but somewhat nonsensical and perseverating about eating/drinking; ST cognitive eval ordered -While there may be an organic and reversible process in play, it is also quite possible that this is a progressive cognitive decline associated with dementia -Will admit with PT/OT/ST/nutrition evaluations -Her family is providing 24/7 caregiver assistance and yet she is requiring significant care; she may benefit from SNF placement -Will request palliative care  evaluation for goals of care -Infection is also a consideration, particularly given her leukocytosis (also present on last admission and thought to be reactive in nature, see below) -Hold Remeron, Celexa given prolonged QT  Cirrhosis with ascites -MELD/MELD-Na score is 27/29 (base on prior INR; current INR is pending), with a mortality rate of 19.6% -She has h/o ascites with  need for remote paracentesis x 2 -Will order paracentesis today -Given leukocytosis, there is concern for SBP as the cause of her symptoms -Will start Rocephin -Mildly elevated NH4, appears to be similar to prior admission -Hold Lasix but continue Aldactone for now -Continue rifaximin  Leukocytosis, polycythemia vera -Known h/o polycythemia vera, which is a known chronic myeloproliferative neoplasm -She is not taking Jakafi due to dysphagia -In addition to increased RBC mass, this is commonly associated thrombocytosis -It may also be associated with leukocytosis - although this is a marked change from 6/16 -Will request oncology consult tomorrow by her oncologist, Dr. [name] -Peripheral smear ordered -Neutrophil predominance - likely c/w polycythemia vera as per conversation with Dr. Lindi Adie today  Chronic diarrhea -Patient has chronic diarrhea and takes Lomotil and Imodium for this -Husband reports that it is not significant worse than baseline -C-diff ordered on admission -Will not order lactulose at this time given this issue  Dysphagia -Noted with large pills, not taking her Jakafi for this reason -ST swallow evaluation  CAD  -s/p stents -Continue Plavix  HTN -Continue Norvasc -Hold Cozaar  HLD -Continue Zetia  DM -Recent A1c 5.4, excellent control -hold Lantus -Cover with sensitive-scale SSI  Stage 3b CKD -Appears to be stable, but advancing renal disease in conjunction with advanced liver disease is worrisome -Will follow -Hold Cozaar  Obesity Body mass index is 34.33 kg/m.  -Weight loss would be beneficial   Note: This patient has been tested and is negative for the novel coronavirus COVID-19.  DVT prophylaxis:   Lovenox  Code Status:  DNR - confirmed with patient/family Family Communication: Husband was present throughout evaluation Disposition Plan:  The patient is from: home  Anticipated d/c is to: home with [facility] services vs. SNF Anticipated d/c  date will depend on clinical response to treatment  Patient is currently: acutely ill Consults called: Palliative care; PT/OT/ST/Nutrition  Admission status:  Admit - It is my clinical opinion that admission to INPATIENT is reasonable and necessary because of the expectation that this patient will require hospital care that crosses at least 2 midnights to treat this condition based on the medical complexity of the problems presented.  Given the aforementioned information, the predictability of an adverse outcome is felt to be significant.    Karmen Bongo MD [facility]   How to contact the [facility] Attending or Consulting provider [place] or covering provider during after hours [place], for this patient?  1. Check the care team in [facility] and look for a) attending/consulting [facility] provider listed and b) the [facility] team listed 2. Log into [email or web address] and use [place]'s universal password to access. If you do not have the password, please contact the hospital operator. 3. Locate the [facility] provider you are looking for under [facility] and page to a number that you can be directly reached. 4. If you still have difficulty reaching the provider, please page the [facility] (Director on Call) for the Hospitalists listed on amion for assistance.   05/04/2020, 6:58 PM

## 2020-05-04 NOTE — Progress Notes (Signed)
New Admission Note:   Arrival Method: stretcher Mental Orientation:  Telemetry: Box 5 Assessment: Completed Skin: Intact stage 2 on left buttock and fissure  IV: left hand  Pain: 0/10  Tubes: none  Safety Measures: Safety Fall Prevention Plan has been given, discussed and signed Admission: Completed [facility] [place] Orientation: Patient has been orientated to the room, unit and staff.  Family: none      Patient had expiratory wheeze, she refuses nebulize treatment .  Orders have been reviewed and implemented. Will continue to monitor the patient. Call light has been placed within reach and bed alarm has been activated.   Macyn Remmert RN [facility] Phone: [phone]

## 2020-05-04 NOTE — ED Notes (Signed)
Called to give report, nurse unavailable

## 2020-05-05 ENCOUNTER — Encounter (HOSPITAL_COMMUNITY): Payer: Self-pay | Admitting: Internal Medicine

## 2020-05-05 ENCOUNTER — Inpatient Hospital Stay (HOSPITAL_COMMUNITY): Payer: Medicare HMO

## 2020-05-05 DIAGNOSIS — D72829 Elevated white blood cell count, unspecified: Secondary | ICD-10-CM | POA: Diagnosis not present

## 2020-05-05 DIAGNOSIS — K529 Noninfective gastroenteritis and colitis, unspecified: Secondary | ICD-10-CM

## 2020-05-05 DIAGNOSIS — R627 Adult failure to thrive: Secondary | ICD-10-CM

## 2020-05-05 DIAGNOSIS — Z515 Encounter for palliative care: Secondary | ICD-10-CM

## 2020-05-05 DIAGNOSIS — K7460 Unspecified cirrhosis of liver: Secondary | ICD-10-CM | POA: Diagnosis not present

## 2020-05-05 DIAGNOSIS — R188 Other ascites: Secondary | ICD-10-CM

## 2020-05-05 DIAGNOSIS — Z66 Do not resuscitate: Secondary | ICD-10-CM

## 2020-05-05 LAB — BASIC METABOLIC PANEL
Anion gap: 14 (ref 5–15)
BUN: 21 mg/dL (ref 8–23)
CO2: 14 mmol/L — ABNORMAL LOW (ref 22–32)
Calcium: 8 mg/dL — ABNORMAL LOW (ref 8.9–10.3)
Chloride: 105 mmol/L (ref 98–111)
Creatinine, Ser: 2.02 mg/dL — ABNORMAL HIGH (ref 0.44–1.00)
GFR calc Af Amer: 26 mL/min — ABNORMAL LOW (ref >60)
GFR calc non Af Amer: 23 mL/min — ABNORMAL LOW (ref >60)
Glucose, Bld: 127 mg/dL — ABNORMAL HIGH (ref 70–99)
Potassium: 3.6 mmol/L (ref 3.5–5.1)
Sodium: 133 mmol/L — ABNORMAL LOW (ref 135–145)

## 2020-05-05 LAB — HEPATIC FUNCTION PANEL
ALT: 7 U/L (ref 0–44)
AST: 28 U/L (ref 15–41)
Albumin: 2.4 g/dL — ABNORMAL LOW (ref 3.5–5.0)
Alkaline Phosphatase: 138 U/L — ABNORMAL HIGH (ref 38–126)
Bilirubin, Direct: 0.6 mg/dL — ABNORMAL HIGH (ref 0.0–0.2)
Indirect Bilirubin: 1.2 mg/dL — ABNORMAL HIGH (ref 0.3–0.9)
Total Bilirubin: 1.8 mg/dL — ABNORMAL HIGH (ref 0.3–1.2)
Total Protein: 5.1 g/dL — ABNORMAL LOW (ref 6.5–8.1)

## 2020-05-05 LAB — CBC
HCT: 41.2 % (ref 36.0–46.0)
Hemoglobin: 12 g/dL (ref 12.0–15.0)
MCH: 28.8 pg (ref 26.0–34.0)
MCHC: 29.1 g/dL — ABNORMAL LOW (ref 30.0–36.0)
MCV: 99 fL (ref 80.0–100.0)
Platelets: 250 10*3/uL (ref 150–400)
RBC: 4.16 MIL/uL (ref 3.87–5.11)
RDW: 21.9 % — ABNORMAL HIGH (ref 11.5–15.5)
WBC: 40.4 10*3/uL — ABNORMAL HIGH (ref 4.0–10.5)
nRBC: 0 % (ref 0.0–0.2)

## 2020-05-05 LAB — GLUCOSE, CAPILLARY
Glucose-Capillary: 129 mg/dL — ABNORMAL HIGH (ref 70–99)
Glucose-Capillary: 136 mg/dL — ABNORMAL HIGH (ref 70–99)
Glucose-Capillary: 117 mg/dL — ABNORMAL HIGH (ref 70–99)
Glucose-Capillary: 137 mg/dL — ABNORMAL HIGH (ref 70–99)

## 2020-05-05 LAB — CYTOLOGY - NON PAP

## 2020-05-05 MED ORDER — ENOXAPARIN SODIUM 30 MG/0.3ML ~~LOC~~ SOLN
30.0000 mg | SUBCUTANEOUS | Status: DC
  Administered 2020-05-06 – 2020-05-07 (×2): 30 mg via SUBCUTANEOUS
  Filled 2020-05-05 (×2): qty 0.3

## 2020-05-05 MED ORDER — CHLORHEXIDINE GLUCONATE CLOTH 2 % EX PADS
6.0000 | MEDICATED_PAD | Freq: Every day | CUTANEOUS | Status: DC
  Administered 2020-05-05 – 2020-05-08 (×3): 6 via TOPICAL

## 2020-05-05 MED ORDER — STERILE WATER FOR INJECTION IV SOLN
INTRAVENOUS | Status: DC
  Filled 2020-05-05 (×3): qty 9.71

## 2020-05-05 MED ORDER — GERHARDT'S BUTT CREAM
TOPICAL_CREAM | Freq: Two times a day (BID) | CUTANEOUS | Status: DC
  Filled 2020-05-05 (×2): qty 1

## 2020-05-05 MED ORDER — SODIUM CHLORIDE 0.9% FLUSH: 10.0000 mL | INTRAVENOUS | Status: DC | PRN

## 2020-05-05 MED ORDER — ENSURE ENLIVE PO LIQD
237.0000 mL | Freq: Three times a day (TID) | ORAL | Status: DC
  Administered 2020-05-07: 237 mL via ORAL

## 2020-05-05 MED ORDER — MUPIROCIN 2 % EX OINT
1.0000 "application " | TOPICAL_OINTMENT | Freq: Two times a day (BID) | CUTANEOUS | Status: DC
  Administered 2020-05-05 – 2020-05-08 (×7): 1 via NASAL
  Filled 2020-05-05 (×3): qty 22

## 2020-05-05 MED ORDER — ADULT MULTIVITAMIN W/MINERALS CH
1.0000 | ORAL_TABLET | Freq: Every day | ORAL | Status: DC
  Administered 2020-05-05 – 2020-05-08 (×3): 1 via ORAL
  Filled 2020-05-05 (×4): qty 1

## 2020-05-05 NOTE — Consult Note (Addendum)
Referring Provider:  Dr. Tyrell Antonio, [facility] Primary Care Physician:  [name], NP Primary Gastroenterologist:  Dr. [name]  Reason for Consultation:  Cirrhosis, ascites, FTT  HPI: Amanda [name] is a 79 y.o. female with medical history significant of polycythemia; CAD s/p stents on Plavix; HTN; HLD; IDDM; obesity; and cirrhosis presenting essentially with failure to thrive and significant decline in health status recently.  Was at SNF recently and has not been doing well.  Stays in bed, cannot walk, not eating.  Husband cannot manage and care for her at home.  Reports diarrhea, which has a chronic component.  No abdominal pain but husband has noticed increase in girth.  CT abdomen and pelvis with contrast on 7/18 showed the following:  IMPRESSION: 1. Findings consistent with hepatic cirrhosis with mild splenomegaly and mild ascites. 2. Minimal cholelithiasis.  Aortic Atherosclerosis (ICD10-I70.0).  Had 3.3 Liter paracentesis following that, negative for SBP.  Rocephin had been started empirically.  She underwent an EGD 01/21/20 which showed a normal esophagus, no varices. 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 Gastric mucosal atrophy with congestions likely due to portal hypertension. No gastric ulcers. A colonoscopy was deferred.  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 Previously placed endoclips in the duodenum were intact. No evidence of active or recent bleeding. Biopsies were negative for H. Pylori.  She is not on lactulose at home due to diarrhea and they have not been able to afford or get assistance for Xifaxan.  Past Medical History:  Diagnosis Date   Allergic rhinitis 01/23/2016   C. difficile colitis    CAD (coronary artery disease)    a. s/p STEMI in 01/2015 with 95% LCx stenosis and distal 80% LCx stenosis (DESx2 placed)    Cancer ([place])    SKIN   Cirrhosis ([place])    from fatty liver   Depression    Diabetes mellitus 2008   Gout    Herpes simplex    Hyperlipidemia    Hypertension    Obesity    Polycythemia    Dr. Elease Hashimoto- [facility]   Psoriasis     Past Surgical History:  Procedure Laterality Date   ANKLE FRACTURE SURGERY     BIOPSY  05/26/2018   Procedure: BIOPSY;  Surgeon: [name], MD;  Location: [facility] ENDOSCOPY;  Service: Endoscopy;;   BIOPSY  02/19/2020   Procedure: BIOPSY;  Surgeon: Thornton Park, MD;  Location: [place];  Service: Gastroenterology;;   BREAST SURGERY Left    milk duct   CATARACT EXTRACTION, BILATERAL     COLONOSCOPY     ENTEROSCOPY N/A 02/19/2020   Procedure: ENTEROSCOPY;  Surgeon: Thornton Park, MD;  Location: [place];  Service: Gastroenterology;  Laterality: N/A;   ESOPHAGOGASTRODUODENOSCOPY N/A 05/26/2018   Procedure: ESOPHAGOGASTRODUODENOSCOPY (EGD);  Surgeon: [name], MD;  Location: [facility] ENDOSCOPY;  Service: Endoscopy;  Laterality: N/A;   ESOPHAGOGASTRODUODENOSCOPY (EGD) WITH PROPOFOL N/A 01/21/2020   Procedure: ESOPHAGOGASTRODUODENOSCOPY (EGD) WITH PROPOFOL;  Surgeon: Yetta Flock, MD;  Location: [place];  Service: Gastroenterology;  Laterality: N/A;   HEMOSTASIS CLIP PLACEMENT  01/21/2020   Procedure: HEMOSTASIS CLIP PLACEMENT;  Surgeon: Yetta Flock, MD;  Location: [place] ENDOSCOPY;  Service: Gastroenterology;;   HOT HEMOSTASIS N/A 01/21/2020   Procedure: HOT HEMOSTASIS (ARGON PLASMA COAGULATION/BICAP);  Surgeon: Yetta Flock, MD;  Location: [facility] ENDOSCOPY;  Service: Gastroenterology;  Laterality: N/A;   HOT  HEMOSTASIS N/A 02/19/2020   Procedure: HOT HEMOSTASIS (ARGON PLASMA COAGULATION/BICAP);  Surgeon: Thornton Park, MD;  Location: [place];  Service: Gastroenterology;  Laterality: N/A;   IR IMAGING GUIDED PORT INSERTION  08/30/2019   IR PARACENTESIS  05/25/2018   LEFT HEART CATHETERIZATION WITH  CORONARY ANGIOGRAM N/A 01/27/2015   Procedure: LEFT HEART CATHETERIZATION WITH CORONARY ANGIOGRAM;  Surgeon: [name], MD;  Location: [facility] CATH LAB;  Service: Cardiovascular;  Laterality: N/A;   TONSILLECTOMY     TOTAL ABDOMINAL HYSTERECTOMY W/ BILATERAL SALPINGOOPHORECTOMY     for heavy periods with appendectomy    Prior to Admission medications   Medication Sig Start Date End Date Taking? Authorizing Provider  amLODipine (NORVASC) 5 MG tablet Take 5 mg by mouth daily.   Yes [provider]  citalopram (CELEXA) 20 MG tablet Take 1 tablet (20 mg total) by mouth daily. 04/15/20 07/14/20 Yes Nafziger, Tommi Rumps, NP  clopidogrel (PLAVIX) 75 MG tablet Take 75 mg by mouth daily.  02/29/20  Yes [provider]  diphenoxylate-atropine (LOMOTIL) 2.5-0.025 MG tablet Take 1 tablet by mouth 4 (four) times daily as needed for diarrhea or loose stools. 04/15/20  Yes Nafziger, Tommi Rumps, NP  ezetimibe (ZETIA) 10 MG tablet Take 1 tablet (10 mg total) by mouth daily. 02/13/20  Yes [name], MD  fluticasone (FLONASE) 50 MCG/ACT nasal spray Place 1 spray into both nostrils daily.   Yes [provider]  furosemide (LASIX) 40 MG tablet Take 0.5 tablets (20 mg total) by mouth daily. Patient taking differently: Take 40 mg by mouth daily.  04/02/20  Yes Ghimire, Henreitta Leber, MD  insulin aspart (NOVOLOG) 100 UNIT/ML FlexPen 0-9 Units, Subcutaneous, 3 times daily with meals CBG < 70: Implement Hypoglycemia measures, Call primary MD CBG 70 - 120: 0 units CBG 121 - 150: 1 unit CBG 151 - 200: 2 units CBG 201 - 250: 3 units CBG 251 - 300: 5 units CBG 301 - 350: 7 units CBG 351 - 400: 9 units CBG > 400: call primary MD Patient taking differently: Inject 0-9 Units into the skin 3 (three) times daily with meals. 0-9 Units, Subcutaneous, 3 times daily with meals CBG < 70: Implement Hypoglycemia measures, Call primary MD CBG 70 - 120: 0 units CBG 121 - 150: 1 unit CBG 151 - 200: 2 units CBG 201 - 250: 3 units  CBG 251 - 300: 5 units CBG 301 - 350: 7 units CBG 351 - 400: 9 units CBG > 400: call primary MD 04/02/20  Yes Ghimire, Henreitta Leber, MD  insulin glargine (LANTUS SOLOSTAR) 100 UNIT/ML Solostar Pen Inject 16 Units into the skin daily. Patient taking differently: Inject 10 Units into the skin daily.  04/07/20  Yes Shamleffer, Melanie Crazier, MD  latanoprost (XALATAN) 0.005 % ophthalmic solution Place 1 drop into both eyes at bedtime.  03/23/19  Yes [provider]  lidocaine-prilocaine (EMLA) cream Apply 1 application topically as needed. Please apply EMLA over the Port-A-Cath site 1 hour prior to coming to our office. Patient taking differently: Apply 1 application topically daily as needed (for port).  08/17/19  Yes Volanda Napoleon, MD  loperamide (IMODIUM A-D) 2 MG tablet Take 2 mg by mouth 4 (four) times daily as needed for diarrhea or loose stools.   Yes [provider]  losartan (COZAAR) 100 MG tablet Take 100 mg by mouth daily.   Yes [provider]  meclizine (ANTIVERT) 25 MG tablet TAKE 1 TABLET(25 MG) BY MOUTH THREE TIMES  DAILY AS NEEDED FOR DIZZINESS Patient taking differently: Take 25 mg by mouth 3 (three) times daily as needed for dizziness.  04/30/20  Yes Nafziger, Tommi Rumps, NP  mirtazapine (REMERON) 7.5 MG tablet Take 7.5 mg by mouth at bedtime.   Yes [provider]  ondansetron (ZOFRAN ODT) 4 MG disintegrating tablet Take 1 tablet (4 mg total) by mouth every 8 (eight) hours as needed. Patient taking differently: Take 4 mg by mouth every 8 (eight) hours as needed for nausea or vomiting.  05/02/20  Yes Nafziger, Tommi Rumps, NP  spironolactone (ALDACTONE) 25 MG tablet Take 0.5 tablets (12.5 mg total) by mouth daily. Patient taking differently: Take 12.5 mg by mouth 2 (two) times daily.  01/25/20  Yes Lavina Hamman, MD  triamcinolone cream (KENALOG) 0.1 % Apply 1 application topically 2 (two) times daily. Patient taking differently: Apply 1 application topically 2 (two)  times daily as needed (rash).  12/11/19  Yes Nafziger, Tommi Rumps, NP  XIFAXAN 550 MG TABS tablet Take 1 tablet (550 mg total) by mouth 2 (two) times daily. 04/08/20 07/07/20 Yes Milus Banister, MD  Blood Glucose Monitoring Suppl (ACCU-CHEK GUIDE) w/Device KIT 1 kit by Does not apply route as directed. 01/25/18   [name], MD  Continuous Blood Gluc Receiver (FREESTYLE LIBRE 2 READER) DEVI For continuous blood glucose monitoring. E11.42 04/18/20   [name], MD  Continuous Blood Gluc Sensor (FREESTYLE LIBRE 2 SENSOR) MISC For continuous blood glucose monitoring. E11.42 04/18/20   [name], MD  glucose blood (ACCU-CHEK GUIDE) test strip Use to check blood sugars 3 times daily 01/25/18   [name], MD  Insulin Pen Needle 32G X 4 MM MISC 1 Device by Does not apply route in the morning, at noon, in the evening, and at bedtime. 04/07/20   Shamleffer, Melanie Crazier, MD  mirtazapine (REMERON) 15 MG tablet Take 1 tablet (15 mg total) by mouth at bedtime. Patient not taking: Reported on 05/04/2020 08/17/19   [name], NP  [facility] VERIO test strip USE TO TEST BLOOD SUGAR THREE TIMES DAILY 05/01/18   [name], MD  pantoprazole (PROTONIX) 40 MG tablet Take 1 tablet (40 mg total) by mouth 2 (two) times daily before a meal. Patient not taking: Reported on 05/04/2020 03/18/20 06/16/20  [name], NP    Current Facility-Administered Medications  Medication Dose Route Frequency Provider Last Rate Last Admin   acetaminophen (TYLENOL) tablet 650 mg  650 mg Oral Q6H PRN Karmen Bongo, MD       Or   acetaminophen (TYLENOL) suppository 650 mg  650 mg Rectal Q6H PRN Karmen Bongo, MD       albuterol (PROVENTIL) (2.5 MG/3ML) 0.083% nebulizer solution 2.5 mg  2.5 mg Nebulization Q2H PRN Karmen Bongo, MD       cefTRIAXone (ROCEPHIN) 2 g in sodium chloride 0.9 % 100 mL IVPB  2 g Intravenous Q24H Karmen Bongo, MD 200 mL/hr at 05/04/20 2032 2 g at 05/04/20 2032    Chlorhexidine Gluconate Cloth 2 % PADS 6 each  6 each Topical Q0600 Karmen Bongo, MD   6 each at 05/05/20 5176   clopidogrel (PLAVIX) tablet 75 mg  75 mg Oral Daily Karmen Bongo, MD   75 mg at 05/05/20 0948   diphenoxylate-atropine (LOMOTIL) 2.5-0.025 MG per tablet 1 tablet  1 tablet Oral QID PRN Karmen Bongo, MD       enoxaparin (LOVENOX) injection 40 mg  40 mg Subcutaneous Q24H Karmen Bongo, MD   40 mg at 05/04/20  1702   ezetimibe (ZETIA) tablet 10 mg  10 mg Oral Daily Karmen Bongo, MD   10 mg at 05/05/20 0948   insulin aspart (novoLOG) injection 0-9 Units  0-9 Units Subcutaneous TID WC Karmen Bongo, MD   1 Units at 05/05/20 0700   latanoprost (XALATAN) 0.005 % ophthalmic solution 1 drop  1 drop Both Eyes QHS Karmen Bongo, MD   1 drop at 05/04/20 2251   loperamide (IMODIUM) capsule 2 mg  2 mg Oral QID PRN Karmen Bongo, MD       mupirocin ointment (BACTROBAN) 2 % 1 application  1 application Nasal BID Karmen Bongo, MD   1 application at 44/31/54 0949   ondansetron (ZOFRAN) tablet 4 mg  4 mg Oral Q6H PRN Karmen Bongo, MD       Or   ondansetron [facility]) injection 4 mg  4 mg Intravenous Q6H PRN Karmen Bongo, MD       pantoprazole (PROTONIX) EC tablet 40 mg  40 mg Oral Daily Karmen Bongo, MD   40 mg at 05/05/20 0948   potassium chloride SA (KLOR-CON) CR tablet 40 mEq  40 mEq Oral Once Karmen Bongo, MD       rifaximin Doreene Nest) tablet 550 mg  550 mg Oral BID Karmen Bongo, MD   550 mg at 05/05/20 0948   sodium chloride 0.225 % with sodium bicarbonate 100 mEq infusion   Intravenous Continuous Regalado, Belkys A, MD 75 mL/hr at 05/05/20 0955 New Bag at 05/05/20 0955   sodium chloride flush (NS) 0.9 % injection 3 mL  3 mL Intravenous Lillia Mountain, MD   3 mL at 05/04/20 1346   timolol (TIMOPTIC) 0.5 % ophthalmic solution 1 drop  1 drop Both Eyes Daily Karmen Bongo, MD        Allergies as of 05/04/2020 - Review Complete 05/04/2020   Allergen Reaction Noted   Doxycycline Hives, Swelling, and Rash    Penicillins Swelling and Other (See Comments) 05/23/2018   Lisinopril Cough 04/08/2015   Tape Hives 01/28/2014   Latex Hives, Itching, and Rash 09/07/2011    Family History  Problem Relation Age of Onset   Diabetes Mother    Heart disease Mother        CAD   Hyperlipidemia Mother    Hypertension Mother    Kidney disease Mother    Kidney disease Father    Breast cancer Maternal Aunt    Heart disease Maternal Grandmother    Heart disease Other        maternal aunts and uncles   Colon cancer Neg Hx    Esophageal cancer Neg Hx     Social History   Socioeconomic History   Marital status: Married    Spouse name: [name]   Number of children: 2   Years of education: 2 yr colle   Highest education level: Not on file  Occupational History   Occupation: housewife  Tobacco Use   Smoking status: Former Smoker    Years: 48.00    Types: Cigarettes    Quit date: 10/18/1986    Years since quitting: 33.5   Smokeless tobacco: Never Used  Vaping Use   Vaping Use: Never used  Substance and Sexual Activity   Alcohol use: No    Alcohol/week: 0.0 standard drinks   Drug use: No   Sexual activity: Yes    Partners: Male    Birth control/protection: None  Other Topics Concern   Not on file  Social History Narrative  2 caffeine drink per day.  No regular exercise.     Retired from the bank   2 children (Daughter and a son) son has morbid obesity   2 grandchildren   3 great grandchildren   Social Determinants of Radio broadcast assistant Strain:    Difficulty of Paying Living Expenses:   Food Insecurity:    Worried About Charity fundraiser in the Last Year:    Arboriculturist in the Last Year:   Transportation Needs:    Film/video editor (Medical):    Lack of Transportation (Non-Medical):   Physical Activity:    Days of Exercise per Week:    Minutes of Exercise per  Session:   Stress:    Feeling of Stress :   Social Connections:    Frequency of Communication with Friends and Family:    Frequency of Social Gatherings with Friends and Family:    Attends Religious Services:    Active Member of Clubs or Organizations:    Attends Music therapist:    Marital Status:   Intimate Partner Violence:    Fear of Current or Ex-Partner:    Emotionally Abused:    Physically Abused:    Sexually Abused:    Review of Systems: ROS is O/W negative except as mentioned in HPI.  Physical Exam: Vital signs in last 24 hours: Temp:  [97.5 F (36.4 C)-98.3 F (36.8 C)] 98.3 F (36.8 C) (07/19 0543) Pulse Rate:  [73-84] 75 (07/19 0543) Resp:  [11-18] 15 (07/19 0543) BP: (108-139)/(43-83) 113/43 (07/19 0543) SpO2:  [91 %-95 %] 94 % (07/19 0543) Last BM Date: 05/04/20 General:  Alert, chronically ill-appearing in NAD Head:  Normocephalic and atraumatic. Eyes:  Sclera clear, no icterus.  Conjunctiva pink. Ears:  Normal auditory acuity. Mouth:  No deformity or lesions.   Lungs:  Clear throughout to auscultation.  No wheezes, crackles, or rhonchi.  Heart:  Regular rate and rhythm; no murmurs, clicks, rubs, or gallops. Abdomen:  Soft, somewhat distended.  BS present.  Non-tender.  Msk:  Symmetrical without gross deformities. Pulses:  Normal pulses noted. Extremities:  Without clubbing or edema. Neurologic:  Alert and oriented x 4;  grossly normal neurologically. Skin:  Intact without significant lesions or rashes. Psych:  Alert and cooperative. Normal mood and affect.  Intake/Output from previous day: 07/18 0701 - 07/19 0700 In: 2008.1 [P.O.:100; I.V.:808.1; IV Piggyback:1100] Out: 0   Lab Results: Recent Labs    05/04/20 0219 05/04/20 0852 05/05/20 0510  WBC 45.8* 55.0* 40.4*  HGB 13.8 13.8 12.0  HCT 44.7 45.2 41.2  PLT 566* 542* 250   BMET Recent Labs    05/04/20 0219 05/05/20 0510  NA 134* 133*  K 3.0* 3.6  CL 102 105   CO2 17* 14*  GLUCOSE 158* 127*  BUN 17 21  CREATININE 1.43* 2.02*  CALCIUM 8.6* 8.0*   LFT Recent Labs    05/04/20 0219  PROT 6.3*  ALBUMIN 2.8*  AST 24  ALT 7  ALKPHOS 160*  BILITOT 2.6*   PT/INR Recent Labs    05/04/20 1932  LABPROT 18.5*  INR 1.6*   Studies/Results: CT ABDOMEN PELVIS W CONTRAST  Result Date: 05/04/2020 CLINICAL DATA:  Generalized abdominal pain, nausea, vomiting. EXAM: CT ABDOMEN AND PELVIS WITH CONTRAST TECHNIQUE: Multidetector CT imaging of the abdomen and pelvis was performed using the standard protocol following bolus administration of intravenous contrast. CONTRAST:  16m OMNIPAQUE IOHEXOL 300 MG/ML  SOLN COMPARISON:  Feb 20, 2020. FINDINGS: Lower chest: No acute abnormality. Hepatobiliary: Findings consistent with hepatic cirrhosis. Minimal cholelithiasis is noted. No biliary dilatation is noted. Stable heterogeneity of hepatic parenchyma is noted without a dominant focal lesion. Pancreas: Unremarkable. No pancreatic ductal dilatation or surrounding inflammatory changes. Spleen: Mild splenomegaly is noted. Adrenals/Urinary Tract: Adrenal glands are unremarkable. Kidneys are normal, without renal calculi, focal lesion, or hydronephrosis. Bladder is unremarkable. Stomach/Bowel: The stomach appears normal. There is no evidence of bowel obstruction or inflammation. The appendix is not visualized, but no inflammation is noted in the right lower quadrant. Vascular/Lymphatic: Aortic atherosclerosis. No enlarged abdominal or pelvic lymph nodes. Reproductive: Uterus and bilateral adnexa are unremarkable. Other: Mild ascites is noted, including around the liver and spleen. No definite hernia is noted. Musculoskeletal: No acute or significant osseous findings. IMPRESSION: 1. Findings consistent with hepatic cirrhosis with mild splenomegaly and mild ascites. 2. Minimal cholelithiasis. Aortic Atherosclerosis (ICD10-I70.0). Electronically Signed   By: Marijo Conception M.D.    On: 05/04/2020 10:15   US RENAL  Result Date: 05/05/2020 CLINICAL DATA:  Acute CT in. EXAM: RENAL / URINARY TRACT ULTRASOUND COMPLETE COMPARISON:  None. FINDINGS: Right Kidney: Renal measurements: 10.9 x 4.9 x 5.2 cm = volume: 144 mL. Increased cortical echogenicity. No mass or hydronephrosis visualized. Left Kidney: Renal measurements: 10.5 x 4.9 x 4.3 cm = volume: 111 mL. Increased cortical echogenicity. No mass or hydronephrosis visualized. Bladder: Appears normal for degree of bladder distention. Other: Incidental note of small volume ascites. IMPRESSION: 1. Increased cortical echogenicity, in keeping with medical renal disease. No hydronephrosis. 2.  Incidental note of small volume ascites. Electronically Signed   By: Eddie Candle M.D.   On: 05/05/2020 09:32   US Paracentesis  Result Date: 05/04/2020 INDICATION: Recurrent ascites NASH Cirrhosis EXAM: ULTRASOUND GUIDED LLQ PARACENTESIS MEDICATIONS: 10 cc 1% lidocaine COMPLICATIONS: None immediate. PROCEDURE: Informed written consent was obtained from the patient after a discussion of the risks, benefits and alternatives to treatment. A timeout was performed prior to the initiation of the procedure. Initial ultrasound scanning demonstrates a large amount of ascites within the left lower abdominal quadrant. The left lower abdomen was prepped and draped in the usual sterile fashion. 1% lidocaine was used for local anesthesia. Following this, a 34 G Yueh catheter was introduced. An ultrasound image was saved for documentation purposes. The paracentesis was performed. The catheter was removed and a dressing was applied. The patient tolerated the procedure well without immediate post procedural complication. Patient received post-procedure intravenous albumin; see nursing notes for details. FINDINGS: A total of approximately 3.3 Liters of dark yellow fluid was removed. Samples were sent to the laboratory as requested by the clinical team. IMPRESSION:  Successful ultrasound-guided paracentesis yielding 3.3 liters of peritoneal fluid. Read by Lavonia Drafts [facility] Electronically Signed   By: Corrie Mckusick D.O.   On: 05/04/2020 14:43   DG Chest Portable 1 View  Result Date: 05/04/2020 CLINICAL DATA:  Weakness.  Nausea, vomiting and diarrhea. EXAM: PORTABLE CHEST 1 VIEW COMPARISON:  03/28/2020 FINDINGS: Right jugular porta catheter tip in the right atrium approximately 4 cm inferior to the superior cavoatrial junction. Stable enlarged cardiac silhouette. Poor inspiration with mild bibasilar atelectasis. Diffuse osteopenia. IMPRESSION: 1. Poor inspiration with mild bibasilar atelectasis. 2. Stable cardiomegaly. Electronically Signed   By: Claudie Revering M.D.   On: 05/04/2020 12:42   IMPRESSION:  *Decompensated NASH cirrhosis *Ascites:  S/p 3.3 Liter paracentesis on 7/18, negative for SBP.  Lasix 40 mg daily and  spironolactone 12.5 mg daily at home, but they have been held here due to increase in Cr. *Leukocytosis:  55k yesterday, down to 40k today.  Was up to 40k in June during admission and thought to be reactive.  ? Due to her polycythemia.  Oncology will be seeing the patient here as well. *Acute on chronic kidney injury:  Cr 2.02 today as compared to 1.43 yesterday. *Coagulopathy secondary to cirrhosis:  INR 1.6 today. *Chronic antiplatelet use with Plavix due history of CAD *IDDM:  Well controlled with Hgb A1C 5.4 last month *Diarrhea, chronic:  Cdiff pending. *Elevated ammonia at 64:  Could not tolerate lactulose due to diarrhea.  Cannot afford xifaxan at home but they have started it here. *Mild hyponatremia:  Na+ 133 today.  This appears to be chronic due to her liver disease. *Polycythemia:  Not taking her Jakafi due to dysphagia to big pills.  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  She is s/p EGD and small bowel enteroscopy recently.  Hgb stable/normal currently and no sign of bleeding. *FTT:  Has been declining significantly.   Palliative care consult has been placed as well.  PLAN: *Check urine sodium.  ? Renal consult if renal function worsens. *Monitor/trend labs. *Fluid studies for SBP were negative.  Does not need Rocephin for this issue.  Can be discontinued. *Not sure that we have anything else to add from GI standpoint for now.  GOC need to be established which palliative is working on.  Janett Billow D. Zehr  05/05/2020, 10:03 AM  GI ATTENDING  History, laboratories, x-rays, prior endoscopy reports personally reviewed.  Agree with comprehensive consultation note as outlined above.  Complex patient who presents with failure to thrive.  This on the background of multiple medical problems including decompensated NASH cirrhosis.  No evidence for SBP.  Renal insufficiency an issue with regards to diuretic therapy for ascites.  Poor appetite and weakness multifactorial.  Marked leukocytosis needs to be explained.  Agree that C. difficile related should be ruled out.  Primary bone marrow disorder as well.  Overall prognosis poor.  Agree with palliative care input.  Docia Chuck. Geri Seminole., M.D. [facility] Division of Gastroenterology

## 2020-05-05 NOTE — Evaluation (Signed)
Speech Language Pathology Evaluation Patient Details Name: Amanda [name] MRN: [id number] DOB: 01-29-1941 Today's Date: 05/05/2020 Time: 0626-9485 SLP Time Calculation (min) (ACUTE ONLY): 12 min  Problem List:  Patient Active Problem List   Diagnosis Date Noted  . Severe diabetic hypoglycemia ([place]) 03/28/2020  . Acute hypokalemia 03/28/2020  . Hypokalemia   . Gastritis and gastroduodenitis   . Acute GI bleeding 02/17/2020  . Weakness generalized   . AVM (arteriovenous malformation) of small bowel, acquired with hemorrhage   . Malnutrition of moderate degree 01/16/2020  . Goals of care, counseling/discussion   . Palliative care by specialist   . Nausea & vomiting 01/13/2020  . Failure to thrive in adult 01/13/2020  . Myeloproliferative disorder ([place]) 01/13/2020  . Portal vein thrombosis   . SIRS (systemic inflammatory response syndrome) ([place]) 12/28/2019  . Worsening lower extremity weakness 12/14/2019  . Leg swelling 08/21/2019  . Hypotension due to hypovolemia 08/10/2019  . Stage 3b chronic kidney disease 08/10/2019  . Acute blood loss anemia 05/23/2018  . Dehydration 03/22/2018  . AKI (acute kidney injury) ([place]) 03/22/2018  . History of CVA (cerebrovascular accident) 03/22/2018  . Hip injury, left, subsequent encounter 11/08/2017  . Iron deficiency anemia 12/22/2016  . OSA (obstructive sleep apnea) 04/21/2016  . Osteopenia 04/07/2016  . Lung nodule, solitary 02/05/2016  . Aortic dilatation ([place]) 02/05/2016  . Dyspnea 01/28/2016  . Cirrhosis ([employer]) 01/28/2016  . Allergic rhinitis 01/23/2016  . Hepatic cirrhosis ([place]) 10/28/2015  . Irritable bowel syndrome 08/12/2015  . Obesity (BMI 30-39.9) 04/30/2015  . Diabetic retinopathy of both eyes ([place]) 04/18/2015  . Former smoker 04/18/2015  . GAD (generalized anxiety disorder) 04/18/2015  . Status post primary angioplasty with coronary stent 04/18/2015  . Coronary artery disease involving native coronary artery 03/01/2015  .  Chronic diastolic CHF (congestive heart failure) ([place]) 02/01/2015  . ST elevation myocardial infarction (STEMI) involving left circumflex coronary artery in recovery phase ([place]) 01/27/2015  . Idioventricular rhythm ([place])   . Diabetic peripheral neuropathy associated with type 2 diabetes mellitus ([place]) 01/23/2015  . Type 2 diabetes mellitus with diabetic polyneuropathy ([place]) 01/23/2015  . Thrombocytosis ([place]) 07/04/2014  . Cholelithiasis 02/07/2014  . Chronic diarrhea 01/29/2014  . Leukocytosis 06/02/2013  . [name] ([place]) 05/31/2013  . Essential hypertension 05/31/2013  . History of Bell's palsy 05/26/2013  . DERMATITIS, ATOPIC 11/16/2010  . DIZZINESS 11/16/2010  . DIASTOLIC DYSFUNCTION 46/27/0350  . Hyperlipidemia 12/15/2009  . Essential hypertension, benign 11/24/2009  . PSORIASIS 11/24/2009  . Depression 09/13/2009  . 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[place]) 07/08/2008   Past Medical History:  Past Medical History:  Diagnosis Date  . Allergic rhinitis 01/23/2016  . C. difficile colitis   . CAD (coronary artery disease)    a. s/p STEMI in 01/2015 with 95% LCx stenosis and distal 80% LCx stenosis (DESx2 placed)  . Cancer ([place])    SKIN  . Cirrhosis ([place])    from fatty liver  . Depression   . Diabetes mellitus 2008  . Gout   . Herpes simplex   . Hyperlipidemia   . Hypertension   . Obesity   . Polycythemia    Dr. Elease Hashimoto- [facility]  . Psoriasis    Past Surgical History:  Past Surgical History:  Procedure Laterality Date  . ANKLE FRACTURE SURGERY    . BIOPSY  05/26/2018   Procedure: BIOPSY;  Surgeon: [name], MD;  Location[facility] END[facility]COPY;  Service: Endoscopy;;  . BIOPSY  02/19/2020   Procedure:  BIOPSY;  Surgeon: Thornton Park, MD;  Location: [place];  Service: Gastroenterology;;  . BREAST SURGERY Left    milk duct  . CATARACT EXTRACTION, BILATERAL    . COLONOSCOPY    . ENTEROSCOPY N/A 02/19/2020   Procedure: ENTEROSCOPY;  Surgeon:  Thornton Park, MD;  Location: [place];  Service: Gastroenterology;  Laterality: N/A;  . ESOPHAGOGASTRODUODENOSCOPY N/A 05/26/2018   Procedure: ESOPHAGOGASTRODUODENOSCOPY (EGD);  Surgeon: [name], MD;  Location: [facility] ENDOSCOPY;  Service: Endoscopy;  Laterality: N/A;  . ESOPHAGOGASTRODUODENOSCOPY (EGD) WITH PROPOFOL N/A 01/21/2020   Procedure: ESOPHAGOGASTRODUODENOSCOPY (EGD) WITH PROPOFOL;  Surgeon: Yetta Flock, MD;  Location: [place];  Service: Gastroenterology;  Laterality: N/A;  . HEMOSTASIS CLIP PLACEMENT  01/21/2020   Procedure: HEMOSTASIS CLIP PLACEMENT;  Surgeon: Yetta Flock, MD;  Location: [place] ENDOSCOPY;  Service: Gastroenterology;;  . HOT HEMOSTASIS N/A 01/21/2020   Procedure: HOT HEMOSTASIS (ARGON PLASMA COAGULATION/BICAP);  Surgeon: Yetta Flock, MD;  Location: [facility] ENDOSCOPY;  Service: Gastroenterology;  Laterality: N/A;  . HOT HEMOSTASIS N/A 02/19/2020   Procedure: HOT HEMOSTASIS (ARGON PLASMA COAGULATION/BICAP);  Surgeon: Thornton Park, MD;  Location: [place];  Service: Gastroenterology;  Laterality: N/A;  . IR IMAGING GUIDED PORT INSERTION  08/30/2019  . IR PARACENTESIS  05/25/2018  . LEFT HEART CATHETERIZATION WITH CORONARY ANGIOGRAM N/A 01/27/2015   Procedure: LEFT HEART CATHETERIZATION WITH CORONARY ANGIOGRAM;  Surgeon: [name], MD;  Location: [facility] CATH LAB;  Service: Cardiovascular;  Laterality: N/A;  . TONSILLECTOMY    . TOTAL ABDOMINAL HYSTERECTOMY W/ BILATERAL SALPINGOOPHORECTOMY     for heavy periods with appendectomy   HPI:  Amanda [name] is a 79 y.o. female with medical history significant of polycythemia; CAD s/p stents; HTN; HLD; DM; obesity; and cirrhosis presenting with n/v/d. CXR on 7/18 reported "Poor inspiration with mild bibasilar atelectasis."  Pt has a hx of cognitive deficits evidenced via SLE on 03/23/18 which reported deficits in reasoning, working and short term memory and emergent awareness of errors.   Assessment /  Plan / Recommendation Clinical Impression  Pt was seen for a cognitive-linguistic evaluation.  She was encountered awake/alert with husband present at bedside.  She was noted to be intermittently agitated, stating that she was unable to answer certain questions at this time.  She was oriented to herself and the time given a clock.  She was not oriented to state, date, or year.  Husband reported that he was responsible for the pt's IADLs, including medications, finances, etc.  Per chart review, pt has a hx of cognitive deficits (see HPI), but pt and husband both noted changes in her cognitive functioning since this admission.  Pt completed portions of the [place] Examination in addition to informal evaluation measures.  She exhibited deficits in short-term memory, numeric problem solving, safety judgement, attention, and executive functioning.  Evaluation was abbreviated secondary to arrival of Palliative NP, but expressive/receptive language appeared to be functional.  Pt would benefit from additional skilled ST targeting cognitive deficits or 24/7 supervision at time of discharge.  Would additionally recommend full assistance with IADLs.  SLP will f/u acutely for tx targeting cognitive deficits.      SLP Assessment  SLP Recommendation/Assessment: Patient needs continued Speech Lanaguage Pathology Services SLP Visit Diagnosis: Dysphagia, unspecified (R13.10)    Follow Up Recommendations  24 hour supervision/assistance    Frequency and Duration min 2x/week  2 weeks      SLP Evaluation Cognition  Overall Cognitive Status: History of cognitive impairments - at  baseline Arousal/Alertness: Awake/alert Orientation Level: Oriented to person;Disoriented to place;Oriented to time Attention: Sustained;Focused Focused Attention: Impaired Focused Attention Impairment: Verbal basic;Functional basic Sustained Attention: Impaired Sustained Attention Impairment: Verbal basic;Functional basic Memory:  Impaired Memory Impairment: Decreased short term memory Decreased Short Term Memory: Verbal basic Awareness: Impaired Awareness Impairment: Emergent impairment Problem Solving: Impaired Problem Solving Impairment: Functional complex;Verbal complex Executive Function: Organizing Organizing: Impaired Organizing Impairment: Verbal complex Behaviors: Verbal agitation Safety/Judgment: Impaired       Comprehension  Auditory Comprehension Overall Auditory Comprehension: Appears within functional limits for tasks assessed    Expression Expression Primary Mode of Expression: Verbal Verbal Expression Overall Verbal Expression: Appears within functional limits for tasks assessed Written Expression Dominant Hand: Right Written Expression: Not tested   Oral / Motor  Oral Motor/Sensory Function Overall Oral Motor/Sensory Function: Within functional limits Motor Speech Overall Motor Speech: Appears within functional limits for tasks assessed   GO                   Colin Mulders M.S., [place] Acute Rehabilitation Services Office: [phone]  Elvia Collum [place] 05/05/2020, 11:22 AM

## 2020-05-05 NOTE — Evaluation (Signed)
Clinical/Bedside Swallow Evaluation Patient Details  Name: Amanda [name] MRN: [id number] Date of Birth: Mar 14, 1941  Today's Date: 05/05/2020 Time: SLP Start Time (ACUTE ONLY): 1038 SLP Stop Time (ACUTE ONLY): 1050 SLP Time Calculation (min) (ACUTE ONLY): 12 min  Past Medical History:  Past Medical History:  Diagnosis Date  . Allergic rhinitis 01/23/2016  . C. difficile colitis   . CAD (coronary artery disease)    a. s/p STEMI in 01/2015 with 95% LCx stenosis and distal 80% LCx stenosis (DESx2 placed)  . Cancer ([place])    SKIN  . Cirrhosis ([place])    from fatty liver  . Depression   . Diabetes mellitus 2008  . Gout   . Herpes simplex   . Hyperlipidemia   . Hypertension   . Obesity   . Polycythemia    Dr. Elease Hashimoto- [facility]ology  . Psoriasis    Past Surgical History:  Past Surgical History:  Procedure Laterality Date  . ANKLE FRACTURE SURGERY    . BIOPSY  05/26/2018   Procedure: BIOPSY;  Surgeon: [name], MD;  Location: [facility] ENDOSCOPY;  Service: Endoscopy;;  . BIOPSY  02/19/2020   Procedure: BIOPSY;  Surgeon: Thornton Park, MD;  Location: [place];  Service: Gastroenterology;;  . BREAST SURGERY Left    milk duct  . CATARACT EXTRACTION, BILATERAL    . COLONOSCOPY    . ENTEROSCOPY N/A 02/19/2020   Procedure: ENTEROSCOPY;  Surgeon: Thornton Park, MD;  Location: [place];  Service: Gastroenterology;  Laterality: N/A;  . ESOPHAGOGASTRODUODENOSCOPY N/A 05/26/2018   Procedure: ESOPHAGOGASTRODUODENOSCOPY (EGD);  Surgeon: [name], MD;  Location: [facility] ENDOSCOPY;  Service: Endoscopy;  Laterality: N/A;  . ESOPHAGOGASTRODUODENOSCOPY (EGD) WITH PROPOFOL N/A 01/21/2020   Procedure: ESOPHAGOGASTRODUODENOSCOPY (EGD) WITH PROPOFOL;  Surgeon: Yetta Flock, MD;  Location: [place];  Service: Gastroenterology;  Laterality: N/A;  . HEMOSTASIS CLIP PLACEMENT  01/21/2020   Procedure: HEMOSTASIS CLIP PLACEMENT;  Surgeon: Yetta Flock, MD;  Location: [place] ENDOSCOPY;   Service: Gastroenterology;;  . HOT HEMOSTASIS N/A 01/21/2020   Procedure: HOT HEMOSTASIS (ARGON PLASMA COAGULATION/BICAP);  Surgeon: Yetta Flock, MD;  Location: [facility] ENDOSCOPY;  Service: Gastroenterology;  Laterality: N/A;  . HOT HEMOSTASIS N/A 02/19/2020   Procedure: HOT HEMOSTASIS (ARGON PLASMA COAGULATION/BICAP);  Surgeon: Thornton Park, MD;  Location: [place];  Service: Gastroenterology;  Laterality: N/A;  . IR IMAGING GUIDED PORT INSERTION  08/30/2019  . IR PARACENTESIS  05/25/2018  . LEFT HEART CATHETERIZATION WITH CORONARY ANGIOGRAM N/A 01/27/2015   Procedure: LEFT HEART CATHETERIZATION WITH CORONARY ANGIOGRAM;  Surgeon: [name], MD;  Location: [facility] CATH LAB;  Service: Cardiovascular;  Laterality: N/A;  . TONSILLECTOMY    . TOTAL ABDOMINAL HYSTERECTOMY W/ BILATERAL SALPINGOOPHORECTOMY     for heavy periods with appendectomy   HPI:  Amanda [name] is a 78 y.o. female with medical history significant of polycythemia; CAD s/p stents; HTN; HLD; DM; obesity; and cirrhosis presenting with n/v/d. CXR on 7/18 reported "Poor inspiration with mild bibasilar atelectasis."  Pt has a hx of cognitive deficits evidenced via SLE on 03/23/18 which reported deficits in reasoning, working and short term memory and emergent awareness of errors.   Assessment / Plan / Recommendation Clinical Impression  Pt was seen for a bedside swallow evaluation. She was encountered awake/alert and her husband was present for a portion of this evaluation.  Oral mechanism was remarkable for edentulism (dentures are at home), otherwise it was unremarkable.  Pt consumed thin liquid via straw and she exhibited an  immediate cough x1, but no overt s/sx of aspiration were observed with 5 additional trials.  Pt refused all puree and she was agreeable to one trial of soft solids (pancake), but she immediately expelled the bolus from her oral cavity stating that it was too cold to consume.  She declined additional trials  despite being offered various choices of solids.  Husband reported that she has refusing all food recently.  Due to limited trials, recommend continuation of current diet (regular solids/thin liquids) at this time, with ST follow up for diagnostic treatment.    SLP Visit Diagnosis: Dysphagia, unspecified (R13.10)    Aspiration Risk  Mild aspiration risk    Diet Recommendation Regular;Thin liquid   Liquid Administration via: Cup;Straw Medication Administration: Whole meds with liquid Supervision: Patient able to self feed Compensations: Minimize environmental distractions;Small sips/bites Postural Changes: Seated upright at 90 degrees    Other  Recommendations Oral Care Recommendations: Oral care BID   Follow up Recommendations 24 hour supervision/assistance      Frequency and Duration min 2x/week  2 weeks       Prognosis Prognosis for Safe Diet Advancement: Good Barriers to Reach Goals: Cognitive deficits      Swallow Study   General HPI: Amanda [name] is a 79 y.o. female with medical history significant of polycythemia; CAD s/p stents; HTN; HLD; DM; obesity; and cirrhosis presenting with n/v/d. CXR on 7/18 reported "Poor inspiration with mild bibasilar atelectasis."  Pt has a hx of cognitive deficits evidenced via SLE on 03/23/18 which reported deficits in reasoning, working and short term memory and emergent awareness of errors. Type of Study: Bedside Swallow Evaluation Previous Swallow Assessment: None  Diet Prior to this Study: Regular;Thin liquids Temperature Spikes Noted: No Respiratory Status: Room air History of Recent Intubation: No Behavior/Cognition: Alert;Confused Oral Cavity Assessment: Within Functional Limits Oral Care Completed by SLP: No Oral Cavity - Dentition: Edentulous (dentures at home ) Vision: Functional for self-feeding Self-Feeding Abilities: Able to feed self;Needs set up Patient Positioning: Upright in bed Baseline Vocal Quality:  Normal Volitional Cough: Strong Volitional Swallow: Able to elicit    Oral/Motor/Sensory Function Overall Oral Motor/Sensory Function: Within functional limits   Ice Chips Ice chips: Not tested   Thin Liquid Thin Liquid: Impaired Presentation: Straw Pharyngeal  Phase Impairments: Cough - Immediate    Nectar Thick Nectar Thick Liquid: Not tested   Honey Thick Honey Thick Liquid: Not tested   Puree Puree: Not tested Other Comments: pt refused   Solid     Solid: Not tested Presentation: Spoon Other Comments: Pt expelled bolus from oral cavity      Colin Mulders., M.S., CCC-SLP Acute Rehabilitation Services Office: [phone]  Amanda [name] 05/05/2020,11:12 AM

## 2020-05-05 NOTE — Progress Notes (Signed)
Initial Nutrition Assessment  DOCUMENTATION CODES:   Obesity unspecified  INTERVENTION:   -Liberalize diet to regular, for widest variety of food selections -MVI with minerals daly -Ensure Enlive po TID, each supplement provides 350 kcal and 20 grams of protein  NUTRITION DIAGNOSIS:   Inadequate oral intake related to decreased appetite as evidenced by meal completion < 25%, per patient/family report.  GOAL:   Patient will meet greater than or equal to 90% of their needs  MONITOR:   PO intake, Supplement acceptance, Labs, Weight trends, Skin, I & O's  REASON FOR ASSESSMENT:   Consult Assessment of nutrition requirement/status  ASSESSMENT:   Amanda [name] is a 79 y.o. female with medical history significant of polycythemia; CAD s/p stents; HTN; HLD; DM; obesity; and cirrhosis presenting with n/v/d.  Pt admitted with FTT and cirrhosis with ascites.   7/18- s/p LLQ paracentesis (3.3 L dark yellow fluid removed)  Reviewed I/O's: +2 L x 24 hours  Spoke with pt and husband at bedside. Pt with very flat affect and husband provided with most of the history. Pt husband reports that pt has had a poor appetite "for months", but has significantly decreased over the past 3 weeks. Husband shares that she has been consuming only bites and sips at this time. He estimates that she now consumes about 25% of whatever meal is offered to her. Pt has become a lot more selective about the food she will eat and husband expresses frustration that he will purchase and prepare whatever food item she desires, but eats very little. Pt reports she has very little appetite and does not like the hospital food. RD attempted to elicit food preferences and asked if there was anything she desired to eat. She initially agreed to ice cream, however, changed her mind when RD suggested Magic Cup (husband also interjected, stating "those are nasty"). She is amenable to Ensure supplements.   Pt husband also expressed  frustration over limited menu items. RD changed menu with assist. Also discussed possibility of liberalized diet, which pt agreed to. Case discussed with MD, who gave RD verbal permission to liberalize diet to regular; this will allow pt to have the widest variety of food options.   Per wt hx, wt has been stable over the past 6 months. Pt and husband deny weight loss.   Lab Results  Component Value Date   HGBA1C 5.4 03/29/2020   PTA DM medications are 0-9 units inulin aspart TID with meals and 16 units insulin glargine daily.   Labs reviewed: CBGS: 142-187 (inpatient orders for glycemic control are 0-9 units inuslin aspart TID with meals).   NUTRITION - FOCUSED PHYSICAL EXAM:    Most Recent Value  Orbital Region No depletion  Upper Arm Region Mild depletion  Thoracic and Lumbar Region No depletion  Buccal Region No depletion  Temple Region No depletion  Clavicle Bone Region No depletion  Clavicle and Acromion Bone Region No depletion  Scapular Bone Region No depletion  Dorsal Hand Mild depletion  Patellar Region No depletion  Anterior Thigh Region No depletion  Posterior Calf Region Mild depletion  Edema (RD Assessment) Mild  Hair Reviewed  Eyes Reviewed  Mouth Reviewed  Skin Reviewed  Nails Reviewed       Diet Order:   Diet Order            Diet regular Room service appropriate? Yes with Assist; Fluid consistency: Thin  Diet effective now  EDUCATION NEEDS:   Education needs have been addressed  Skin:  Skin Assessment: Skin Integrity Issues: Skin Integrity Issues:: Other (Comment) Other: fissure to buttocks  Last BM:  05/04/20  Height:   Ht Readings from Last 1 Encounters:  05/04/20 5' 4"  (1.626 m)    Weight:   Wt Readings from Last 1 Encounters:  05/04/20 90.7 kg    Ideal Body Weight:  54.5 kg  BMI:  Body mass index is 34.33 kg/m.  Estimated Nutritional Needs:   Kcal:  1900-2100  Protein:  105-120 grams  Fluid:  > 1.9  L   Loistine Chance, RD, LDN, [place] Registered Dietitian II Certified Diabetes Care and Education Specialist Please refer to [facility] for RD and/or RD on-call/weekend/after hours pager

## 2020-05-05 NOTE — Progress Notes (Signed)
SLP Cancellation Note  Patient Details Name: YANELIZ RADEBAUGH MRN: [id number] DOB: [birth year]   Cancelled treatment:       Reason Eval/Treat Not Completed: Patient at procedure or test/unavailable.  Pt was leaving for ultrasound at this time.  SLP will f/u for bedside swallow evaluation and cognitive-linguistic evaluation as schedule allows.     Elvia Collum [place] 05/05/2020, 8:53 AM

## 2020-05-05 NOTE — Plan of Care (Signed)
°  Problem: Coping: °Goal: Level of anxiety will decrease °Outcome: Progressing °  °

## 2020-05-05 NOTE — Consult Note (Signed)
Consultation Note Date: 05/05/2020   Patient Name: Amanda [name]  DOB: Aug 01, 1941  MRN: [id number]  Age / Sex: 79 y.o., female  PCP: [name], NP Referring Physician: Elmarie Shiley, MD  Reason for Consultation: Establishing goals of care and Psychosocial/spiritual support  HPI/Patient Profile: 79 y.o. female   admitted on 05/04/2020 with past    medical history significant of polycythemia/Dr Ennever; CAD s/p stents; HTN; HLD; DM; obesity; and cirrhosis presenting with n/v/d.  Patient was discharged home from SNF only a few weeks ago and according to her husband it has been difficult providing care.  He himself has health issues.  Patient was nonambulatory.  Poor p.o. intake, chronic diarrhea  Overall failure to thrive.     ED Course:  N/V/D, marked leukocytosis.  Similar presentation 1 month ago - then with hypoglycemia, stayed in rehab x 2 weeks.     Patient and family face treatment option decisions, advanced directive decisions and anticipatory care needs.  Clinical Assessment and Goals of Care:  This NP Wadie Lessen reviewed medical records, received report from team, assessed the patient and then meet at the patient's bedside along with her husband to discuss diagnosis, prognosis, GOC, EOL wishes disposition and options.   Concept of Palliative Care was introduced as specialized medical care for people and their families living with serious illness.  If focuses on providing relief from the symptoms and stress of a serious illness.  The goal is to improve quality of life for both the patient and the family.   Created space and opportunity to patient and her family to explore their thoughts and feelings regarding patient's current medical situation.  Patient's husband verbalizes to his wife that he cannot continue to take care of her at home.  He is physically unable to provide safe care.    This is upsetting to Amanda [name].   She pleads with her husband "do not put me away yet"  Emotional support offered to both patient and her husband, this is a very difficult situation for them.     A  discussion was had today regarding advanced directives.  Concepts specific to code status, artifical feeding and hydration, continued IV antibiotics and rehospitalization was had.  The difference between a aggressive medical intervention path  and a palliative comfort care path for this patient at this time was had.  Values and goals of care important to patient and family were attempted to be elicited.   MOST form introduced   Natural trajectory and expectations at EOL were discussed.  Questions and concerns addressed.  Patient  encouraged to call with questions or concerns.     PMT will continue to support holistically.   HCPOA/husband    SUMMARY OF RECOMMENDATIONS    Code Status/Advance Care Planning:  DNR   Palliative Prophylaxis:   Aspiration, Bowel Regimen, Delirium Protocol, Frequent Pain Assessment and Oral Care  Additional Recommendations (Limitations, Scope, Preferences):  Full Scope Treatment  Psycho-social/Spiritual:   Desire for further Chaplaincy support:no Additional Recommendations: Education  on Hospice.      Prognosis:   Unable to determine   Await oncology input  Discharge Planning:  Patient's husband verbalizes great concern regarding transition of care from the hospital.  He does not believe that he can deliver safe care in the home however he speaks to not having the financial wherewithal to "pay for a facility". Discussed with social work/Vanessa    To Be Determined      Primary Diagnoses: Present on Admission: . Failure to thrive in adult . Stage 3b chronic kidney disease . Chronic diarrhea . Essential hypertension . Hepatic cirrhosis ([place]) . Hyperlipidemia . Obesity (BMI 30-39.9) . [name] ([place]) . 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 Leukocytosis   I have reviewed the medical record, interviewed the patient and family, and examined the patient. The following aspects are pertinent.  Past Medical History:  Diagnosis Date  . Allergic rhinitis 01/23/2016  . C. difficile colitis   . CAD (coronary artery disease)    a. s/p STEMI in 01/2015 with 95% LCx stenosis and distal 80% LCx stenosis (DESx2 placed)  . Cancer ([place])    SKIN  . Cirrhosis ([place])    from fatty liver  . Depression   . Diabetes mellitus 2008  . Gout   . Herpes simplex   . Hyperlipidemia   . Hypertension   . Obesity   . Polycythemia    Dr. Elease Hashimoto- [facility]  . Psoriasis    Social History   Socioeconomic History  . Marital status: Married    Spouse name: Amanda [name]  . Number of children: 2  . Years of education: 2 yr colle  . Highest education level: Not on file  Occupational History  . Occupation: housewife  Tobacco Use  . Smoking status: Former Smoker    Years: 48.00    Types: Cigarettes    Quit date: 10/18/1986    Years since quitting: 33.5  . Smokeless tobacco: Never Used  Vaping Use  . Vaping Use: Never used  Substance and Sexual Activity  . Alcohol use: No    Alcohol/week: 0.0 standard drinks  . Drug use: No  . Sexual activity: Yes    Partners: Male    Birth control/protection: None  Other Topics Concern  . Not on file  Social History Narrative   2 caffeine drink per day.  No regular exercise.     Retired from the bank   2 children (Daughter and a son) son has morbid obesity   2 grandchildren   3 great grandchildren   Social Determinants of Radio broadcast assistant Strain:   . Difficulty of Paying Living Expenses:   Food Insecurity:   . Worried About Charity fundraiser in the Last Year:   . Arboriculturist in the Last Year:   Transportation Needs:   . Film/video editor (Medical):   Marland Kitchen Lack of Transportation (Non-Medical):   Physical Activity:   . Days of Exercise per Week:    . Minutes of Exercise per Session:   Stress:   . Feeling of Stress :   Social Connections:   . Frequency of Communication with Friends and Family:   . Frequency of Social Gatherings with Friends and Family:   . Attends Religious Services:   . Active Member of Clubs or Organizations:   . Attends Archivist Meetings:   Marland Kitchen Marital Status:    Family History  Problem Relation Age of Onset  .  Diabetes Mother   . Heart disease Mother        CAD  . Hyperlipidemia Mother   . Hypertension Mother   . Kidney disease Mother   . Kidney disease Father   . Breast cancer Maternal Aunt   . Heart disease Maternal Grandmother   . Heart disease Other        maternal aunts and uncles  . Colon cancer Neg Hx   . Esophageal cancer Neg Hx    Scheduled Meds: . Chlorhexidine Gluconate Cloth  6 each Topical Q0600  . clopidogrel  75 mg Oral [name]  . enoxaparin (LOVENOX) injection  40 mg Subcutaneous Q24H  . ezetimibe  10 mg Oral [name]  . insulin aspart  0-9 Units Subcutaneous TID WC  . latanoprost  1 drop Both Eyes QHS  . mupirocin ointment  1 application Nasal BID  . pantoprazole  40 mg Oral [name]  . potassium chloride SA  40 mEq Oral Once  . rifaximin  550 mg Oral BID  . sodium chloride flush  3 mL Intravenous Q12H  . timolol  1 drop Both Eyes [name]   Continuous Infusions: . cefTRIAXone (ROCEPHIN)  IV 2 g (05/04/20 2032)  .  sodium bicarbonate infusion 1/4 NS 1000 mL 75 mL/hr at 05/05/20 0955   PRN Meds:.acetaminophen **OR** acetaminophen, albuterol, diphenoxylate-atropine, loperamide, ondansetron **OR** ondansetron (ZOFRAN) IV Medications Prior to Admission:  Prior to Admission medications   Medication Sig Start Date End Date Taking? Authorizing Provider  amLODipine (NORVASC) 5 MG tablet Take 5 mg by mouth [name].   Yes [provider]  citalopram (CELEXA) 20 MG tablet Take 1 tablet (20 mg total) by mouth [name]. 04/15/20 07/14/20 Yes Nafziger, Tommi Rumps, NP  clopidogrel (PLAVIX)  75 MG tablet Take 75 mg by mouth [name].  02/29/20  Yes [provider]  diphenoxylate-atropine (LOMOTIL) 2.5-0.025 MG tablet Take 1 tablet by mouth 4 (four) times [name] as needed for diarrhea or loose stools. 04/15/20  Yes Nafziger, Tommi Rumps, NP  ezetimibe (ZETIA) 10 MG tablet Take 1 tablet (10 mg total) by mouth [name]. 02/13/20  Yes [name], MD  fluticasone (FLONASE) 50 MCG/ACT nasal spray Place 1 spray into both nostrils [name].   Yes [provider]  furosemide (LASIX) 40 MG tablet Take 0.5 tablets (20 mg total) by mouth [name]. Patient taking differently: Take 40 mg by mouth [name].  04/02/20  Yes Ghimire, Henreitta Leber, MD  insulin aspart (NOVOLOG) 100 UNIT/ML FlexPen 0-9 Units, Subcutaneous, 3 times [name] with meals CBG < 70: Implement Hypoglycemia measures, Call primary MD CBG 70 - 120: 0 units CBG 121 - 150: 1 unit CBG 151 - 200: 2 units CBG 201 - 250: 3 units CBG 251 - 300: 5 units CBG 301 - 350: 7 units CBG 351 - 400: 9 units CBG > 400: call primary MD Patient taking differently: Inject 0-9 Units into the skin 3 (three) times [name] with meals. 0-9 Units, Subcutaneous, 3 times [name] with meals CBG < 70: Implement Hypoglycemia measures, Call primary MD CBG 70 - 120: 0 units CBG 121 - 150: 1 unit CBG 151 - 200: 2 units CBG 201 - 250: 3 units CBG 251 - 300: 5 units CBG 301 - 350: 7 units CBG 351 - 400: 9 units CBG > 400: call primary MD 04/02/20  Yes Ghimire, Henreitta Leber, MD  insulin glargine (LANTUS SOLOSTAR) 100 UNIT/ML Solostar Pen Inject 16 Units into the skin [name]. Patient taking differently: Inject 10 Units into the  skin [name].  04/07/20  Yes Shamleffer, Melanie Crazier, MD  latanoprost (XALATAN) 0.005 % ophthalmic solution Place 1 drop into both eyes at bedtime.  03/23/19  Yes [provider]  lidocaine-prilocaine (EMLA) cream Apply 1 application topically as needed. Please apply EMLA over the Port-A-Cath site 1 hour prior to coming to our office. Patient taking differently:  Apply 1 application topically [name] as needed (for port).  08/17/19  Yes Volanda Napoleon, MD  loperamide (IMODIUM A-D) 2 MG tablet Take 2 mg by mouth 4 (four) times [name] as needed for diarrhea or loose stools.   Yes [provider]  losartan (COZAAR) 100 MG tablet Take 100 mg by mouth [name].   Yes [provider]  meclizine (ANTIVERT) 25 MG tablet TAKE 1 TABLET(25 MG) BY MOUTH THREE TIMES [name] AS NEEDED FOR DIZZINESS Patient taking differently: Take 25 mg by mouth 3 (three) times [name] as needed for dizziness.  04/30/20  Yes Nafziger, Tommi Rumps, NP  mirtazapine (REMERON) 7.5 MG tablet Take 7.5 mg by mouth at bedtime.   Yes [provider]  ondansetron (ZOFRAN ODT) 4 MG disintegrating tablet Take 1 tablet (4 mg total) by mouth every 8 (eight) hours as needed. Patient taking differently: Take 4 mg by mouth every 8 (eight) hours as needed for nausea or vomiting.  05/02/20  Yes Nafziger, Tommi Rumps, NP  spironolactone (ALDACTONE) 25 MG tablet Take 0.5 tablets (12.5 mg total) by mouth [name]. Patient taking differently: Take 12.5 mg by mouth 2 (two) times [name].  01/25/20  Yes Lavina Hamman, MD  triamcinolone cream (KENALOG) 0.1 % Apply 1 application topically 2 (two) times [name]. Patient taking differently: Apply 1 application topically 2 (two) times [name] as needed (rash).  12/11/19  Yes Nafziger, Tommi Rumps, NP  XIFAXAN 550 MG TABS tablet Take 1 tablet (550 mg total) by mouth 2 (two) times [name]. 04/08/20 07/07/20 Yes Milus Banister, MD  Blood Glucose Monitoring Suppl (ACCU-CHEK GUIDE) w/Device KIT 1 kit by Does not apply route as directed. 01/25/18   [name], MD  Continuous Blood Gluc Receiver (FREESTYLE LIBRE 2 READER) DEVI For continuous blood glucose monitoring. E11.42 04/18/20   [name], MD  Continuous Blood Gluc Sensor (FREESTYLE LIBRE 2 SENSOR) MISC For continuous blood glucose monitoring. E11.42 04/18/20   [name], MD  glucose blood (ACCU-CHEK GUIDE) test strip  Use to check blood sugars 3 times [name] 01/25/18   [name], MD  Insulin Pen Needle 32G X 4 MM MISC 1 Device by Does not apply route in the morning, at noon, in the evening, and at bedtime. 04/07/20   Shamleffer, Melanie Crazier, MD  mirtazapine (REMERON) 15 MG tablet Take 1 tablet (15 mg total) by mouth at bedtime. Patient not taking: Reported on 05/04/2020 08/17/19   [name], NP  [facility] VERIO test strip USE TO TEST BLOOD SUGAR THREE TIMES [name] 05/01/18   [name], MD  pantoprazole (PROTONIX) 40 MG tablet Take 1 tablet (40 mg total) by mouth 2 (two) times [name] before a meal. Patient not taking: Reported on 05/04/2020 03/18/20 06/16/20  [name], NP   Allergies  Allergen Reactions  . Doxycycline Hives, Swelling and Rash  . Penicillins Swelling and Other (See Comments)    Face swells Has patient had a PCN reaction causing immediate rash, facial/tongue/throat swelling, SOB or lightheadedness with hypotension: Yes Has patient had a PCN reaction causing severe rash involving mucus membranes or skin necrosis: No Has patient had a PCN reaction that required hospitalization:  No Has patient had a PCN reaction occurring within the last 10 years: No If all of the above answers are "NO", then may proceed with Cephalosporin use.  Marland Kitchen Lisinopril Cough  . Tape Hives  . Latex Hives, Itching and Rash   Review of Systems  Neurological: Positive for weakness.    Physical Exam Constitutional:      Appearance: She is normal weight. She is ill-appearing.  Cardiovascular:     Rate and Rhythm: Normal rate.  Skin:    General: Skin is warm and dry.  Neurological:     Mental Status: She is alert.     Vital Signs: BP (!) 113/43 (BP Location: Right Arm)   Pulse 75   Temp 98.3 F (36.8 C)   Resp 15   Ht _0  (1.626 m)   Wt 90.7 kg   SpO2 94%   BMI 34.33 kg/m  Pain Scale: 0-10   Pain Score: 0-No pain   SpO2: SpO2: 94 % O2 Device:SpO2: 94 % O2 Flow Rate: .   IO:  Intake/output summary:   Intake/Output Summary (Last 24 hours) at 05/05/2020 1121 Last data filed at 05/05/2020 0900 Gross per 24 hour  Intake 2008.06 ml  Output 0 ml  Net 2008.06 ml    LBM: Last BM Date: 05/04/20 Baseline Weight: Weight: 90.7 kg Most recent weight: Weight: 90.7 kg     Palliative Assessment/Data: 30 % at best   Discussed with Dr Tyrell Antonio and Janett Billow  PA-C GI  Palliative medicine team will continue to support holistically.  Time In: 0930 Time Out: 1045 Time Total: 75 minutes Greater than 50%  of this time was spent counseling and coordinating care related to the above assessment and plan.  Signed by: Wadie Lessen, NP   Please contact Palliative Medicine Team phone at [phone] for questions and concerns.  For individual provider: See Shea Evans

## 2020-05-05 NOTE — Progress Notes (Signed)
PROGRESS NOTE    Amanda [name]  VCB:[id number] DOB: December 11, 1940 DOA: 05/04/2020 PCP: [name], NP   Brief Narrative: 79 year old with past medical history significant for polycythemia, CAD s/p stents, hypertension, HLD, diabetes, obesity, cirrhosis who presented with nausea dry heaving and diarrhea.  Was complaining of shortness of breath, not feeling well.  Poor appetite.  Patient was recently released from a skilled nursing facility, as she was not doing well, when she was relieved from a skilled nursing facility.  Patient admitted with failure to thrive, ascites, she underwent paracentesis on 7/18 yielding 3 L of fluid.   Assessment & Plan:   Principal Problem:   Failure to thrive in adult Active Problems:   Hyperlipidemia   [name] (HCC)   Essential hypertension   Leukocytosis   Obesity (BMI 30-39.9)   Hepatic cirrhosis (HCC)   Chronic diarrhea   Type 2 diabetes mellitus with diabetic polyneuropathy (HCC)   Stage 3b chronic kidney disease  1-Failure to Thrive: -Probably related to underlying chronic medical disease. -Palliative care consulted. -Holding Remeron and Celexa given prolonged QT. -PT OT evaluation. -Improvement of nausea after paracentesis.  2-Cirrhosis with ascites Meld score 27/29 Underwent paracentesis, no evidence of SBP on ascites fluid GI Consulted. Continue with rifaximin.  3-Leukocytosis, polycythemia vera area: We will continue with ceftriaxone for now Dr. [name] consulted  4-Chronic diarrhea: Patient reports 1 bowel movement daily. C. difficile was ordered.  5-Dysphagia Speech consulted  6-CAD status post stent: Continue with Plavix  7-Hypertension: Hold Cozaar and Norvasc.  8-AKI on CKD stage IIIb; Metabolic acidosis;  Cr baseline 1.5--1.2 Worsening renal function today.  Renal US; no hydronephrosis.  Started IV bicarb low rate.   Estimated body mass index is 34.33 kg/m as calculated from the following:   Height  as of this encounter: 5' 4"  (1.626 m).   Weight as of this encounter: 90.7 kg.   DVT prophylaxis: Lovenox Code Status: DNR Family Communication: care discussed with husband who was at bedside Disposition Plan:  Status is: Inpatient  Remains inpatient appropriate because:Persistent severe electrolyte disturbances and Ongoing active pain requiring inpatient pain management   Dispo: The patient is from: Home              Anticipated d/c is to: to be determine              Anticipated d/c date is: 2 days              Patient currently is not medically stable to d/c.        Consultants:   GI  Palliative  Procedures:     Antimicrobials:    Subjective:  Alert , nausea better. She has been having one BM daily.  Report no appetite.   Objective: Vitals:   05/04/20 1740 05/04/20 2148 05/05/20 0140 05/05/20 0543  BP: 132/83 (!) 116/56 133/66 (!) 113/43  Pulse: 83 77 73 75  Resp: 18 16 16 15   Temp: (!) 97.5 F (36.4 C) 98.2 F (36.8 C) 98 F (36.7 C) 98.3 F (36.8 C)  TempSrc: Oral     SpO2: 93% 91% 93% 94%  Weight:      Height:        Intake/Output Summary (Last 24 hours) at 05/05/2020 0737 Last data filed at 05/05/2020 0600 Gross per 24 hour  Intake 2008.06 ml  Output 0 ml  Net 2008.06 ml   Filed Weights   05/04/20 0938  Weight: 90.7 kg    Examination:  General  exam: Appears calm and comfortable  Respiratory system: Clear to auscultation. Respiratory effort normal. Cardiovascular system: S1 & S2 heard, RRR. No JVD, murmurs, rubs, gallops or clicks. No pedal edema. Gastrointestinal system: Abdomen is nondistended, soft and nontender. No organomegaly or masses felt. Normal bowel sounds heard. Central nervous system: Alert and oriented. No focal neurological deficits. Extremities: Symmetric 5 x 5 power.   Data Reviewed: I have personally reviewed following labs and imaging studies  CBC: Recent Labs  Lab 05/04/20 0219 05/04/20 0852 05/05/20 0510   WBC 45.8* 55.0* 40.4*  NEUTROABS  --  52.8*  --   HGB 13.8 13.8 12.0  HCT 44.7 45.2 41.2  MCV 91.2 92.1 99.0  PLT 566* 542* 193   Basic Metabolic Panel: Recent Labs  Lab 05/04/20 0219 05/05/20 0510  NA 134* 133*  K 3.0* 3.6  CL 102 105  CO2 17* 14*  GLUCOSE 158* 127*  BUN 17 21  CREATININE 1.43* 2.02*  CALCIUM 8.6* 8.0*   GFR: Estimated Creatinine Clearance: 24.2 mL/min (A) (by C-G formula based on SCr of 2.02 mg/dL (H)). Liver Function Tests: Recent Labs  Lab 05/04/20 0219  AST 24  ALT 7  ALKPHOS 160*  BILITOT 2.6*  PROT 6.3*  ALBUMIN 2.8*   Recent Labs  Lab 05/04/20 0219  LIPASE 27   Recent Labs  Lab 05/04/20 0852  AMMONIA 64*   Coagulation Profile: Recent Labs  Lab 05/04/20 1932  INR 1.6*   Cardiac Enzymes: No results for input(s): CKTOTAL, CKMB, CKMBINDEX, TROPONINI in the last 168 hours. BNP (last 3 results) No results for input(s): PROBNP in the last 8760 hours. HbA1C: No results for input(s): HGBA1C in the last 72 hours. CBG: Recent Labs  Lab 05/04/20 1657 05/04/20 1744 05/04/20 2149 05/05/20 0641  GLUCAP 164* 187* 142* 129*   Lipid Profile: No results for input(s): CHOL, HDL, LDLCALC, TRIG, CHOLHDL, LDLDIRECT in the last 72 hours. Thyroid Function Tests: No results for input(s): TSH, T4TOTAL, FREET4, T3FREE, THYROIDAB in the last 72 hours. Anemia Panel: No results for input(s): VITAMINB12, FOLATE, FERRITIN, TIBC, IRON, RETICCTPCT in the last 72 hours. Sepsis Labs: No results for input(s): PROCALCITON, LATICACIDVEN in the last 168 hours.  Recent Results (from the past 240 hour(s))  SARS Coronavirus 2 by RT PCR (hospital order, performed in [facility] hospital lab) Nasopharyngeal Nasopharyngeal Swab     Status: None   Collection Time: 05/04/20 12:29 PM   Specimen: Nasopharyngeal Swab  Result Value Ref Range Status   SARS Coronavirus 2 NEGATIVE NEGATIVE Final    Comment: (NOTE) SARS-CoV-2 target nucleic acids are NOT  DETECTED.  The SARS-CoV-2 RNA is generally detectable in upper and lower respiratory specimens during the acute phase of infection. The lowest concentration of SARS-CoV-2 viral copies this assay can detect is 250 copies / mL. A negative result does not preclude SARS-CoV-2 infection and should not be used as the sole basis for treatment or other patient management decisions.  A negative result may occur with improper specimen collection / handling, submission of specimen other than nasopharyngeal swab, presence of viral mutation(s) within the areas targeted by this assay, and inadequate number of viral copies (<250 copies / mL). A negative result must be combined with clinical observations, patient history, and epidemiological information.  Fact Sheet for Patients:   StrictlyIdeas.no  Fact Sheet for Healthcare Providers: BankingDealers.co.za  This test is not yet approved or  cleared by the [place] FDA and has been authorized for detection and/or diagnosis of  SARS-CoV-2 by FDA under an Emergency Use Authorization (EUA).  This EUA will remain in effect (meaning this test can be used) for the duration of the COVID-19 declaration under Section 564(b)(1) of the Act, 21 U.S.C. section 360bbb-3(b)(1), unless the authorization is terminated or revoked sooner.  Performed at [facility], [place] [address], [place], [place] [address]   Gram stain     Status: None   Collection Time: 05/04/20  2:39 PM   Specimen: PATH Cytology Peritoneal fluid  Result Value Ref Range Status   Specimen Description PERITONEAL  Final   Special Requests NONE  Final   Gram Stain   Final    CYTOSPIN SMEAR WBC PRESENT,BOTH PMN AND MONONUCLEAR NO ORGANISMS SEEN Performed at [facility], [place] [address] [place], [place] [address]    Report Status 05/04/2020 FINAL  Final         Radiology Studies: CT ABDOMEN PELVIS W CONTRAST  Result Date:  05/04/2020 CLINICAL DATA:  Generalized abdominal pain, nausea, vomiting. EXAM: CT ABDOMEN AND PELVIS WITH CONTRAST TECHNIQUE: Multidetector CT imaging of the abdomen and pelvis was performed using the standard protocol following bolus administration of intravenous contrast. CONTRAST:  8m OMNIPAQUE IOHEXOL 300 MG/ML  SOLN COMPARISON:  Feb 20, 2020. FINDINGS: Lower chest: No acute abnormality. Hepatobiliary: Findings consistent with hepatic cirrhosis. Minimal cholelithiasis is noted. No biliary dilatation is noted. Stable heterogeneity of hepatic parenchyma is noted without a dominant focal lesion. Pancreas: Unremarkable. No pancreatic ductal dilatation or surrounding inflammatory changes. Spleen: Mild splenomegaly is noted. Adrenals/Urinary Tract: Adrenal glands are unremarkable. Kidneys are normal, without renal calculi, focal lesion, or hydronephrosis. Bladder is unremarkable. Stomach/Bowel: The stomach appears normal. There is no evidence of bowel obstruction or inflammation. The appendix is not visualized, but no inflammation is noted in the right lower quadrant. Vascular/Lymphatic: Aortic atherosclerosis. No enlarged abdominal or pelvic lymph nodes. Reproductive: Uterus and bilateral adnexa are unremarkable. Other: Mild ascites is noted, including around the liver and spleen. No definite hernia is noted. Musculoskeletal: No acute or significant osseous findings. IMPRESSION: 1. Findings consistent with hepatic cirrhosis with mild splenomegaly and mild ascites. 2. Minimal cholelithiasis. Aortic Atherosclerosis (ICD10-I70.0). Electronically Signed   By: JMarijo ConceptionM.D.   On: 05/04/2020 10:15   [place]orea[place]acentesis  Result Date: 05/04/2020 INDICATION: Recurrent ascites NASH Cirrhosis EXAM: ULTRASOUND GUIDED LLQ PARACENTESIS MEDICATIONS: 10 cc 1% lidocaine COMPLICATIONS: None immediate. PROCEDURE: Informed written consent was obtained from the patient after a discussion of the risks, benefits and alternatives to  treatment. A timeout was performed prior to the initiation of the procedure. Initial ultrasound scanning demonstrates a large amount of ascites within the left lower abdominal quadrant. The left lower abdomen was prepped and draped in the usual sterile fashion. 1% lidocaine was used for local anesthesia. Following this, a 127G Yueh catheter was introduced. An ultrasound image was saved for documentation purposes. The paracentesis was performed. The catheter was removed and a dressing was applied. The patient tolerated the procedure well without immediate post procedural complication. Patient received post-procedure intravenous albumin; see nursing notes for details. FINDINGS: A total of approximately 3.3 Liters of dark yellow fluid was removed. Samples were sent to the laboratory as requested by the clinical team. IMPRESSION: Successful ultrasound-guided paracentesis yielding 3.3 liters of peritoneal fluid. Read by PLavonia DraftsPChristus Schumpert Medical CenterElectronically Signed   By: JCorrie MckusickD.O.   On: 05/04/2020 14:43   DG Chest Portable 1 View  Result Date: 05/04/2020 CLINICAL DATA:  Weakness.  Nausea, vomiting and diarrhea. EXAM: PORTABLE CHEST 1 VIEW COMPARISON:  03/28/2020 FINDINGS: Right jugular porta catheter tip in the right atrium approximately 4 cm inferior to the superior cavoatrial junction. Stable enlarged cardiac silhouette. Poor inspiration with mild bibasilar atelectasis. Diffuse osteopenia. IMPRESSION: 1. Poor inspiration with mild bibasilar atelectasis. 2. Stable cardiomegaly. Electronically Signed   By: Claudie Revering M.D.   On: 05/04/2020 12:42        Scheduled Meds:  Chlorhexidine Gluconate Cloth  6 each Topical Q0600   clopidogrel  75 mg Oral Daily   enoxaparin (LOVENOX) injection  40 mg Subcutaneous Q24H   ezetimibe  10 mg Oral Daily   insulin aspart  0-9 Units Subcutaneous TID WC   latanoprost  1 drop Both Eyes QHS   mupirocin ointment  1 application Nasal BID   pantoprazole  40 mg  Oral Daily   potassium chloride SA  40 mEq Oral Once   rifaximin  550 mg Oral BID   sodium chloride flush  3 mL Intravenous Q12H   timolol  1 drop Both Eyes Daily   Continuous Infusions:  cefTRIAXone (ROCEPHIN)  IV 2 g (05/04/20 2032)    sodium bicarbonate infusion 1/4 NS 1000 mL       LOS: 1 day    Time spent: 35 minutes    Lyza Houseworth A Hillary Struss, MD [facility]   If 7PM-7AM, please contact night-coverage [email or web address]  05/05/2020, 7:37 AM

## 2020-05-05 NOTE — Progress Notes (Signed)
Occupational Therapy Evaluation Patient Details Name: Amanda [name] MRN: [id number] DOB: 10-13-1941 Today's Date: 05/05/2020    History of Present Illness Amanda [name] is a 79 y.o. female with medical history significant of polycythemia; CAD s/p stents; HTN; HLD; DM; obesity; and cirrhosis presenting with n/v/d.  She reports that she couldn't breathe and hasn't felt well.  Like the "whole world was caving in on me."  This has been going on for a couple of days.  Her husband reports that she came home from SNF and wasn't doing well then - in bed, still couldn't walk right, was eating and drinking ok.   Clinical Impression   Patient here from home where she lives with husband.  She has been requiring a lot of assist at home and has been bed bound since leaving SNF ~2 months ago.  Patient presenting with decreased strength, activity tolerance, balance, and cognition.  She is very fearful of falling.  Required cueing for sequencing and following commands.  Patient not aware of BM in bed which required total assist for cleaning.  Patient too fatigued for further bed mobility after clean up.  Recommending SNF or ALF as patient's husband cannot take care of her by himself.  If they return home patient will need HHOT and total care.  Will continue to follow with OT acutely to address the deficits listed below.      Follow Up Recommendations  SNF;Supervision/Assistance - 24 hour;Other (comment) (or long term care/ALF)    Equipment Recommendations  Other (comment) (defer to next venue)    Recommendations for Other Services       Precautions / Restrictions Precautions Precautions: Fall      Mobility Bed Mobility Overal bed mobility: Needs Assistance Bed Mobility: Rolling Rolling: Mod assist Sidelying to sit: Mod assist;+2 for physical assistance       General bed mobility comments: Able to pull with UB on rails but not able to get legs over to roll  Transfers Overall transfer level:  Needs assistance Equipment used: Rolling walker (2 wheeled) Transfers: Sit to/from Stand Sit to Stand: Max assist;+2 physical assistance         General transfer comment: Attempted Sit to stand multiple times with +2 Max assist, and use of RW first, then stedy; very fearful of falling, and requesting we stop    Balance Overall balance assessment: Needs assistance   Sitting balance-Leahy Scale: Fair       Standing balance-Leahy Scale: Zero                             ADL either performed or assessed with clinical judgement   ADL Overall ADL's : Needs assistance/impaired Eating/Feeding: Minimal assistance;Bed level   Grooming: Minimal assistance;Sitting   Upper Body Bathing: Moderate assistance;Bed level   Lower Body Bathing: Total assistance;Bed level   Upper Body Dressing : Moderate assistance;Bed level   Lower Body Dressing: Total assistance;Sit to/from stand       Toileting- Water quality scientist and Hygiene: Total assistance;Bed level         General ADL Comments: Patient incontinent of bowels and requiring full clean up/linen change.  Patient too tired after 35 min of rolling/cleaning to attempt getting to EOB     Vision         Perception     Praxis      Pertinent Vitals/Pain Pain Assessment: Faces Faces Pain Scale: Hurts little more Pain Location: buttocks area  when wiping/cleaning skin Pain Descriptors / Indicators: Tender Pain Intervention(s): Limited activity within patient's tolerance;Monitored during session;Repositioned     Hand Dominance Right   Extremity/Trunk Assessment Upper Extremity Assessment Upper Extremity Assessment: Generalized weakness   Lower Extremity Assessment Lower Extremity Assessment: Defer to PT evaluation       Communication Communication Communication: No difficulties   Cognition Arousal/Alertness: Awake/alert Behavior During Therapy: Anxious Overall Cognitive Status: Impaired/Different from  baseline Area of Impairment: Orientation;Attention;Memory;Following commands;Safety/judgement;Awareness;Problem solving                 Orientation Level: Disoriented to;Place;Time;Situation Current Attention Level: Focused Memory: Decreased short-term memory Following Commands: Follows one step commands inconsistently;Follows one step commands with increased time Safety/Judgement: Decreased awareness of safety;Decreased awareness of deficits Awareness: Intellectual Problem Solving: Slow processing;Decreased initiation;Difficulty sequencing;Requires verbal cues;Requires tactile cues General Comments: According to SLP evaluation, patient and husband stated cognition is currently worse than normal.  Today patient very fearful of falling, even while laying in bed with rails up.  Needing cues for sequencing.  Patient not aware she had BM in bed, even though it was quite large .    General Comments  Sacral pad removed during clean up, NT/RN aware and placing new one    Exercises     Shoulder Instructions      Home Living Family/patient expects to be discharged to:: Private residence Living Arrangements: Spouse/significant other Available Help at Discharge: Family;Available PRN/intermittently Type of Home: Apartment Home Access: Level entry     Home Layout: One level     Bathroom Shower/Tub: Teacher, early years/pre: Handicapped height     Home Equipment: Environmental consultant - 2 wheels;Walker - 4 wheels;Cane - single point;Bedside commode;Tub bench   Additional Comments: Her bed is adjustable  Lives With: Spouse    Prior Functioning/Environment Level of Independence: Needs assistance  Gait / Transfers Assistance Needed: reports has not been OOB at all since home (at least 2 months) Prior to previous episode of care, used w/c mostly, standing/transferring using RW ADL's / Homemaking Assistance Needed: stays in bed, husband asssits with ADLs.  States they do ot use a bed pan,  and unclear about whether patient uses depends/diapers.   Comments: Looks like a significant decline in function since May        OT Problem List: Decreased strength;Decreased activity tolerance;Decreased range of motion;Impaired balance (sitting and/or standing);Decreased coordination;Decreased cognition;Decreased safety awareness;Pain      OT Treatment/Interventions: Self-care/ADL training;Therapeutic exercise;Energy conservation;Cognitive remediation/compensation;Patient/family education;Balance training;Therapeutic activities    OT Goals(Current goals can be found in the care plan section) Acute Rehab OT Goals Patient Stated Goal: to rest OT Goal Formulation: With patient Time For Goal Achievement: 05/19/20 Potential to Achieve Goals: Fair  OT Frequency: Min 2X/week   Barriers to D/C: Decreased caregiver support  Patient requiring much more assist than husband is able to give       Co-evaluation              AM-PAC OT "6 Clicks" Daily Activity     Outcome Measure Help from another person eating meals?: A Little Help from another person taking care of personal grooming?: A Little Help from another person toileting, which includes using toliet, bedpan, or urinal?: Total Help from another person bathing (including washing, rinsing, drying)?: Total Help from another person to put on and taking off regular upper body clothing?: A Lot Help from another person to put on and taking off regular lower body clothing?: A Lot 6 Click  Score: 12   End of Session Nurse Communication: Mobility status  Activity Tolerance: Patient limited by fatigue Patient left: in bed;with call bell/phone within reach;with bed alarm set;with family/visitor present  OT Visit Diagnosis: Unsteadiness on feet (R26.81);Muscle weakness (generalized) (M62.81);Other symptoms and signs involving cognitive function;Pain Pain - part of body:  (buttocks)                Time: 1251-1330 OT Time Calculation  (min): 39 min Charges:  OT General Charges $OT Visit: 1 Visit OT Evaluation $OT Eval Moderate Complexity: 1 Mod OT Treatments $Self Care/Home Management : 23-37 mins  August Luz, OTR/L   Phylliss Bob 05/05/2020, 3:21 PM

## 2020-05-05 NOTE — Evaluation (Signed)
Physical Therapy Evaluation Patient Details Name: Amanda [name] MRN: [id number] DOB: [birth year] Today's Date: 05/05/2020   History of Present Illness  Amanda [name] is a 79 y.o. female with medical history significant of polycythemia; CAD s/p stents; HTN; HLD; DM; obesity; and cirrhosis presenting with n/v/d.  She reports that she couldn't breathe and hasn't felt well.  Like the "whole world was caving in on me."  This has been going on for a couple of days.  Her husband reports that she came home from SNF and wasn't doing well then - in bed, still couldn't walk right, was eating and drinking ok.  Clinical Impression   Pt admitted with above diagnosis. Comes from home where she lives in an apartment with a level entry with her husband, who provides assist; Since last admission and SNF stay, she has not been OOB, which represents quite a decline from before her last admission, when she could transfer by herself, and walked short distances with RW; It seems her husband is overwhelmed with her care needs; Unable to stand with +2 assist, RW, and tried stedy; Pt currently with functional limitations due to the deficits listed below (see PT Problem List). Pt will benefit from skilled PT to increase their independence and safety with mobility to allow discharge to the venue listed below.       Follow Up Recommendations SNF;Supervision/Assistance - 24 hour (pt and husband likely will refuse)  Given apparent decline in functional status at SNF, dc home is not unreasonable -- would recommend maximizing HH services, and the below listed equipment    Equipment Recommendations  Hospital bed;Wheelchair cushion (measurements PT) Optician, dispensing)    Recommendations for [employer]       Precautions / Restrictions Precautions Precautions: Fall      Mobility  Bed Mobility Overal bed mobility: Needs Assistance Bed Mobility: Rolling;Sidelying to Sit Rolling: Mod assist Sidelying to sit: Mod assist;+2 for  physical assistance       General bed mobility comments: heavy mod assist to 2 to elevate trunk to sit; initial posterior lean due to fear of falling forward  Transfers Overall transfer level: Needs assistance Equipment used: Rolling walker (2 wheeled) Transfers: Sit to/from Stand Sit to Stand: Max assist;+2 physical assistance         General transfer comment: Attempted Sit to stand multiple times with +2 Max assist, and use of RW first, then stedy; very fearful of falling, and requesting we stop  Ambulation/Gait                Stairs            Wheelchair Mobility    Modified Rankin (Stroke Patients Only)       Balance Overall balance assessment: Needs assistance   Sitting balance-Leahy Scale: Fair       Standing balance-Leahy Scale: Zero                               Pertinent Vitals/Pain Pain Assessment: No/denies pain    Home Living Family/patient expects to be discharged to:: Private residence Living Arrangements: Spouse/significant other Available Help at Discharge: Family;Available PRN/intermittently Type of Home: Apartment Home Access: Level entry     Home Layout: One level Home Equipment: Walker - 2 wheels;Walker - 4 wheels;Cane - single point;Bedside commode;Tub bench Additional Comments: Her bed is adjustable    Prior Function Level of Independence: Needs assistance   Gait / Transfers  Assistance Needed: reports has not been OOB at all since home (at least 2 months) Prior to previous episode of care, used w/c mostly, standing/transferring using RW  ADL's / Homemaking Assistance Needed: did not get an entirely clear picture; she has been in the bed, they did not indicate if they are using bedpan when asked  Comments: Looks like a significant decline in function since May     Hand Dominance   Dominant Hand: Right    Extremity/Trunk Assessment   Upper Extremity Assessment Upper Extremity Assessment: Defer to OT  evaluation    Lower Extremity Assessment Lower Extremity Assessment: Generalized weakness (Able to lift RLE and LLE off bed against gravity; Difficulty with MMT due to decr cognition)       Communication   Communication: Other (comment) (Did not speak much)  Cognition Arousal/Alertness: Awake/alert Behavior During Therapy: Anxious (very afraid of falling) Overall Cognitive Status: History of cognitive impairments - at baseline (See also SLP assessment)                                        General Comments General comments (skin integrity, edema, etc.): [name] indicated he is not sure that insurance will pay for a SNF stay    Exercises     Assessment/Plan    PT Assessment Patient needs continued PT services  PT Problem List Decreased strength;Decreased range of motion;Decreased activity tolerance;Decreased balance;Decreased mobility;Decreased coordination;Decreased knowledge of use of DME;Decreased cognition;Decreased safety awareness;Decreased knowledge of precautions;Obesity       PT Treatment Interventions DME instruction;Functional mobility training;Therapeutic activities;Therapeutic exercise;Balance training;Neuromuscular re-education;Cognitive remediation;Patient/family education;Wheelchair mobility training    PT Goals (Current goals can be found in the Care Plan section)  Acute Rehab PT Goals Patient Stated Goal: to lay back down PT Goal Formulation: Patient unable to participate in goal setting Time For Goal Achievement: 05/19/20 Potential to Achieve Goals: Fair    Frequency Min 2X/week   Barriers to discharge Decreased caregiver support It seems Mr. Truby is having difficulty managing at home    Co-evaluation               AM-PAC PT "6 Clicks" Mobility  Outcome Measure Help needed turning from your back to your side while in a flat bed without using bedrails?: A Lot Help needed moving from lying on your back to sitting on the  side of a flat bed without using bedrails?: A Lot Help needed moving to and from a bed to a chair (including a wheelchair)?: Total Help needed standing up from a chair using your arms (e.g., wheelchair or bedside chair)?: Total Help needed to walk in hospital room?: Total Help needed climbing 3-5 steps with a railing? : Total 6 Click Score: 8    End of Session Equipment Utilized During Treatment: Gait belt Activity Tolerance: Other (comment) (Limited by fear of falling) Patient left: in bed;with call bell/phone within reach;with bed alarm set (bed in semi-chair position) Nurse Communication: Mobility status;Need for lift equipment PT Visit Diagnosis: Other abnormalities of gait and mobility (R26.89);Difficulty in walking, not elsewhere classified (R26.2)    Time: 3546-5681 PT Time Calculation (min) (ACUTE ONLY): 25 min   Charges:   PT Evaluation $PT Eval Moderate Complexity: 1 Mod PT Treatments $Therapeutic Activity: 8-22 mins        Roney Marion, PT  Acute Rehabilitation Services Pager [phone] Office [place]  05/05/2020, 12:40 PM

## 2020-05-05 NOTE — Consult Note (Addendum)
[place]  [phone] [phone]    HEMATOLOGY CONSULTATION  Referring MD:  Dr. Niel Hummer  Reason for Referral: [name]  HPI: Amanda [name] is a 79 year old female with a past medical history significant for polycythemia vera, CAD status post stents, hypertension, hyperlipidemia, diabetes, obesity, and cirrhosis.  She presented to the emergency room with nausea, vomiting, diarrhea.  The patient had been in a SNF and came home recently.  She was not doing very well following SNF discharge and was still spending most of her time in bed and had difficulty walking.  Initially when she came home, she was eating and drinking okay but about 3 weeks prior to this admission she stopped eating altogether but was taking in fluids.  She had been sleeping all day and had also developed diarrhea.  On admission, her WBC was 45.8 and platelet count was 566,000.  Her chemistry was significant for a sodium of 134, potassium 3.0, glucose 158, creatinine 1.43, calcium 8.6, total protein 6.3, albumin 2.8, alkaline phosphatase 160, total bilirubin 2.6.  The patient has not been taking her Jakafi secondary to dysphagia.  When seen today, the patient's husband is at the bedside.  He reports that she was extremely weak at home and unable to get out of bed.  He has had to call for assistance on several occasions to get help lifting her up in the bed.  He reports that she spends pretty much all of her time in the bed.  The patient tells me that she is feeling better today. She has been able to take some sips of her food.  Still not really eating very much though. She continues to have diarrhea which her husband states is her baseline.  She is not having any headaches or dizziness.  Denies chest pain or shortness of breath.  Denies abdominal pain, nausea, vomiting.  She has not noticed any bleeding.  No recent fevers or chills.  She has been off her Jakafi for about 1 month.   She has not been able to swallow the pills.  States that she can swallow smaller pills but just not the Jakafi due to the size of the pill.  Physical therapy came to work with the patient this morning but she had not get out of bed or walk.  Hematology was asked to see the patient to make recommendations regarding her polycythemia vera.   Past Medical History:  Diagnosis Date  . Allergic rhinitis 01/23/2016  . C. difficile colitis   . CAD (coronary artery disease)    a. s/p STEMI in 01/2015 with 95% LCx stenosis and distal 80% LCx stenosis (DESx2 placed)  . Cancer ([place])    SKIN  . Cirrhosis ([place])    from fatty liver  . Depression   . Diabetes mellitus 2008  . Gout   . Herpes simplex   . Hyperlipidemia   . Hypertension   . Obesity   . Polycythemia    Dr. Elease Hashimoto- [facility]  . Psoriasis   :    Past Surgical History:  Procedure Laterality Date  . ANKLE FRACTURE SURGERY    . BIOPSY  05/26/2018   Procedure: BIOPSY;  Surgeon: [name], MD;  Location: [facility] ENDOSCOPY;  Service: Endoscopy;;  . BIOPSY  02/19/2020   Procedure: BIOPSY;  Surgeon: Thornton Park, MD;  Location: [place];  Service: Gastroenterology;;  . BREAST SURGERY Left    milk duct  . CATARACT EXTRACTION, BILATERAL    .  COLONOSCOPY    . ENTEROSCOPY N/A 02/19/2020   Procedure: ENTEROSCOPY;  Surgeon: Thornton Park, MD;  Location: [place];  Service: Gastroenterology;  Laterality: N/A;  . ESOPHAGOGASTRODUODENOSCOPY N/A 05/26/2018   Procedure: ESOPHAGOGASTRODUODENOSCOPY (EGD);  Surgeon: [name], MD;  Location[facility] ENDOSCOPY;  Service: Endoscopy;  Laterality: N/A;  . ESOPHAGOGASTRODUODENOSCOPY (EGD) WITH PROPOFOL N/A 01/21/2020   Procedure: ESOPHAGOGASTRODUODENOSCOPY (EGD) WITH PROPOFOL;  Surgeon: Yetta Flock, MD;  Location: [place];  Service: Gastroenterology;  Laterality: N/A;  . HEMOSTASIS CLIP PLACEMENT  01/21/2020   Procedure: HEMOSTASIS CLIP PLACEMENT;  Surgeon: Yetta Flock, MD;   Location: [place] ENDOSCOPY;  Service: Gastroenterology;;  . HOT HEMOSTASIS N/A 01/21/2020   Procedure: HOT HEMOSTASIS (ARGON PLASMA COAGULATION/BICAP);  Surgeon: Yetta Flock, MD;  Location: [facility] ENDOSCOPY;  Service: Gastroenterology;  Laterality: N/A;  . HOT HEMOSTASIS N/A 02/19/2020   Procedure: HOT HEMOSTASIS (ARGON PLASMA COAGULATION/BICAP);  Surgeon: Thornton Park, MD;  Location: [place];  Service: Gastroenterology;  Laterality: N/A;  . IR IMAGING GUIDED PORT INSERTION  08/30/2019  . IR PARACENTESIS  05/25/2018  . LEFT HEART CATHETERIZATION WITH CORONARY ANGIOGRAM N/A 01/27/2015   Procedure: LEFT HEART CATHETERIZATION WITH CORONARY ANGIOGRAM;  Surgeon: [name], MD;  Location: [facility] CATH LAB;  Service: Cardiovascular;  Laterality: N/A;  . TONSILLECTOMY    . TOTAL ABDOMINAL HYSTERECTOMY W/ BILATERAL SALPINGOOPHORECTOMY     for heavy periods with appendectomy  :   CURRENT MEDS: Current Facility-Administered Medications  Medication Dose Route Frequency Provider Last Rate Last Admin  . acetaminophen (TYLENOL) tablet 650 mg  650 mg Oral Q6H PRN Karmen Bongo, MD       Or  . acetaminophen (TYLENOL) suppository 650 mg  650 mg Rectal Q6H PRN Karmen Bongo, MD      . albuterol (PROVENTIL) (2.5 MG/3ML) 0.083% nebulizer solution 2.5 mg  2.5 mg Nebulization Q2H PRN Karmen Bongo, MD      . cefTRIAXone (ROCEPHIN) 2 g in sodium chloride 0.9 % 100 mL IVPB  2 g Intravenous Q24H Karmen Bongo, MD 200 mL/hr at 05/04/20 2032 2 g at 05/04/20 2032  . Chlorhexidine Gluconate Cloth 2 % PADS 6 each  6 each Topical Q0600 Karmen Bongo, MD   6 each at 05/05/20 [phone]  . clopidogrel (PLAVIX) tablet 75 mg  75 mg Oral Daily Karmen Bongo, MD   75 mg at 05/05/20 0948  . diphenoxylate-atropine (LOMOTIL) 2.5-0.025 MG per tablet 1 tablet  1 tablet Oral QID PRN Karmen Bongo, MD      . enoxaparin (LOVENOX) injection 40 mg  40 mg Subcutaneous Q24H Karmen Bongo, MD   40 mg at 05/04/20 1702  .  ezetimibe (ZETIA) tablet 10 mg  10 mg Oral Daily Karmen Bongo, MD   10 mg at 05/05/20 0948  . insulin aspart (novoLOG) injection 0-9 Units  0-9 Units Subcutaneous TID WC Karmen Bongo, MD   1 Units at 05/05/20 0700  . latanoprost (XALATAN) 0.005 % ophthalmic solution 1 drop  1 drop Both Eyes QHS Karmen Bongo, MD   1 drop at 05/04/20 2251  . loperamide (IMODIUM) capsule 2 mg  2 mg Oral QID PRN Karmen Bongo, MD      . mupirocin ointment (BACTROBAN) 2 % 1 application  1 application Nasal BID Karmen Bongo, MD   1 application at 97/35/32 0949  . ondansetron (ZOFRAN) tablet 4 mg  4 mg Oral Q6H PRN Karmen Bongo, MD       Or  . ondansetron [facility]) injection 4 mg  4 mg Intravenous Q6H PRN Karmen Bongo, MD      . pantoprazole (PROTONIX) EC tablet 40 mg  40 mg Oral Daily Karmen Bongo, MD   40 mg at 05/05/20 0948  . potassium chloride SA (KLOR-CON) CR tablet 40 mEq  40 mEq Oral Once Karmen Bongo, MD      . rifaximin Doreene Nest) tablet 550 mg  550 mg Oral BID Karmen Bongo, MD   550 mg at 05/05/20 0948  . sodium chloride 0.225 % with sodium bicarbonate 100 mEq infusion   Intravenous Continuous Regalado, Belkys A, MD 75 mL/hr at 05/05/20 0955 New Bag at 05/05/20 0955  . sodium chloride flush (NS) 0.9 % injection 3 mL  3 mL Intravenous Q12H Karmen Bongo, MD   3 mL at 05/04/20 1346  . timolol (TIMOPTIC) 0.5 % ophthalmic solution 1 drop  1 drop Both Eyes Daily Karmen Bongo, MD          Allergies  Allergen Reactions  . Doxycycline Hives, Swelling and Rash  . Penicillins Swelling and Other (See Comments)    Face swells Has patient had a PCN reaction causing immediate rash, facial/tongue/throat swelling, SOB or lightheadedness with hypotension: Yes Has patient had a PCN reaction causing severe rash involving mucus membranes or skin necrosis: No Has patient had a PCN reaction that required hospitalization: No Has patient had a PCN reaction occurring within the last 10 years:  No If all of the above answers are "NO", then may proceed with Cephalosporin use.  Marland Kitchen Lisinopril Cough  . Tape Hives  . Latex Hives, Itching and Rash  :  Family History  Problem Relation Age of Onset  . Diabetes Mother   . Heart disease Mother        CAD  . Hyperlipidemia Mother   . Hypertension Mother   . Kidney disease Mother   . Kidney disease Father   . Breast cancer Maternal Aunt   . Heart disease Maternal Grandmother   . Heart disease Other        maternal aunts and uncles  . Colon cancer Neg Hx   . Esophageal cancer Neg Hx   :  Social History   Socioeconomic History  . Marital status: Married    Spouse name: [name]  . Number of children: 2  . Years of education: 2 yr colle  . Highest education level: Not on file  Occupational History  . Occupation: housewife  Tobacco Use  . Smoking status: Former Smoker    Years: 48.00    Types: Cigarettes    Quit date: 10/18/1986    Years since quitting: 33.5  . Smokeless tobacco: Never Used  Vaping Use  . Vaping Use: Never used  Substance and Sexual Activity  . Alcohol use: No    Alcohol/week: 0.0 standard drinks  . Drug use: No  . Sexual activity: Yes    Partners: Male    Birth control/protection: None  Other Topics Concern  . Not on file  Social History Narrative   2 caffeine drink per day.  No regular exercise.     Retired from the bank   2 children (Daughter and a son) son has morbid obesity   2 grandchildren   3 great grandchildren   Social Determinants of Radio broadcast assistant Strain:   . Difficulty of Paying Living Expenses:   Food Insecurity:   . Worried About Charity fundraiser in the Last Year:   . [place] in the  Last Year:   Transportation Needs:   . Film/video editor (Medical):   Marland Kitchen Lack of Transportation (Non-Medical):   Physical Activity:   . Days of Exercise per Week:   . Minutes of Exercise per Session:   Stress:   . Feeling of Stress :   Social Connections:   . Frequency  of Communication with Friends and Family:   . Frequency of Social Gatherings with Friends and Family:   . Attends Religious Services:   . Active Member of Clubs or Organizations:   . Attends Archivist Meetings:   Marland Kitchen Marital Status:   Intimate Partner Violence:   . Fear of Current or Ex-Partner:   . Emotionally Abused:   Marland Kitchen Physically Abused:   . Sexually Abused:   :  REVIEW OF SYSTEMS:  A comprehensive 14 point review of systems as negative except as noted in the HPI.    Exam: Patient Vitals for the past 24 hrs:  BP Temp Temp src Pulse Resp SpO2  05/05/20 0543 (!) 113/43 98.3 F (36.8 C) -- 75 15 94 %  05/05/20 0140 133/66 98 F (36.7 C) -- 73 16 93 %  05/04/20 2148 (!) 116/56 98.2 F (36.8 C) -- 77 16 91 %  05/04/20 1740 132/83 (!) 97.5 F (36.4 C) Oral 83 18 93 %  05/04/20 1654 128/65 97.9 F (36.6 C) Oral 81 18 95 %  05/04/20 1430 108/73 -- -- -- -- --  05/04/20 1425 (!) 116/53 -- -- -- -- --  05/04/20 1420 122/64 -- -- -- -- --  05/04/20 1415 139/67 -- -- -- -- --  05/04/20 1407 133/69 -- -- -- -- --  05/04/20 1300 113/63 -- -- 84 12 94 %  05/04/20 1200 110/64 -- -- 80 11 92 %    General: Chronically ill-appearing female, no distress.   Eyes:  no scleral icterus.   ENT:  There were no oropharyngeal lesions.   Lymphatics:  Negative cervical, supraclavicular or axillary adenopathy.   Respiratory: lungs were clear bilaterally without wheezing or crackles.   Cardiovascular:  Regular rate and rhythm, S1/S2, without murmur, rub or gallop.  There was no pedal edema.   GI: Positive bowel sounds, soft, mildly distended   Skin exam was without ecchymosis, petechiae.   Neuro exam was nonfocal.  Patient was alert and oriented.  Attention was good.   Language was appropriate.  Mood was normal without depression.  Speech was not pressured.  Thought content was not tangential.    LABS:  Lab Results  Component Value Date   WBC 40.4 (H) 05/05/2020   HGB 12.0 05/05/2020    HCT 41.2 05/05/2020   PLT 250 05/05/2020   GLUCOSE 127 (H) 05/05/2020   CHOL 167 03/23/2018   TRIG 247 (H) 03/23/2018   HDL 25 (L) 03/23/2018   LDLDIRECT 103.0 02/14/2017   LDLCALC 93 03/23/2018   ALT 7 05/04/2020   AST 24 05/04/2020   NA 133 (L) 05/05/2020   K 3.6 05/05/2020   CL 105 05/05/2020   CREATININE 2.02 (H) 05/05/2020   BUN 21 05/05/2020   CO2 14 (L) 05/05/2020   INR 1.6 (H) 05/04/2020   HGBA1C 5.4 03/29/2020    CT ABDOMEN PELVIS W CONTRAST  Result Date: 05/04/2020 CLINICAL DATA:  Generalized abdominal pain, nausea, vomiting. EXAM: CT ABDOMEN AND PELVIS WITH CONTRAST TECHNIQUE: Multidetector CT imaging of the abdomen and pelvis was performed using the standard protocol following bolus administration of intravenous contrast. CONTRAST:  64m  OMNIPAQUE IOHEXOL 300 MG/ML  SOLN COMPARISON:  Feb 20, 2020. FINDINGS: Lower chest: No acute abnormality. Hepatobiliary: Findings consistent with hepatic cirrhosis. Minimal cholelithiasis is noted. No biliary dilatation is noted. Stable heterogeneity of hepatic parenchyma is noted without a dominant focal lesion. Pancreas: Unremarkable. No pancreatic ductal dilatation or surrounding inflammatory changes. Spleen: Mild splenomegaly is noted. Adrenals/Urinary Tract: Adrenal glands are unremarkable. Kidneys are normal, without renal calculi, focal lesion, or hydronephrosis. Bladder is unremarkable. Stomach/Bowel: The stomach appears normal. There is no evidence of bowel obstruction or inflammation. The appendix is not visualized, but no inflammation is noted in the right lower quadrant. Vascular/Lymphatic: Aortic atherosclerosis. No enlarged abdominal or pelvic lymph nodes. Reproductive: Uterus and bilateral adnexa are unremarkable. Other: Mild ascites is noted, including around the liver and spleen. No definite hernia is noted. Musculoskeletal: No acute or significant osseous findings. IMPRESSION: 1. Findings consistent with hepatic cirrhosis with  mild splenomegaly and mild ascites. 2. Minimal cholelithiasis. Aortic Atherosclerosis (ICD10-I70.0). Electronically Signed   By: Marijo Conception M.D.   On: 05/04/2020 10:15   US RENAL  Result Date: 05/05/2020 CLINICAL DATA:  Acute CT in. EXAM: RENAL / URINARY TRACT ULTRASOUND COMPLETE COMPARISON:  None. FINDINGS: Right Kidney: Renal measurements: 10.9 x 4.9 x 5.2 cm = volume: 144 mL. Increased cortical echogenicity. No mass or hydronephrosis visualized. Left Kidney: Renal measurements: 10.5 x 4.9 x 4.3 cm = volume: 111 mL. Increased cortical echogenicity. No mass or hydronephrosis visualized. Bladder: Appears normal for degree of bladder distention. Other: Incidental note of small volume ascites. IMPRESSION: 1. Increased cortical echogenicity, in keeping with medical renal disease. No hydronephrosis. 2.  Incidental note of small volume ascites. Electronically Signed   By: Eddie Candle M.D.   On: 05/05/2020 09:32   US Paracentesis  Result Date: 05/04/2020 INDICATION: Recurrent ascites NASH Cirrhosis EXAM: ULTRASOUND GUIDED LLQ PARACENTESIS MEDICATIONS: 10 cc 1% lidocaine COMPLICATIONS: None immediate. PROCEDURE: Informed written consent was obtained from the patient after a discussion of the risks, benefits and alternatives to treatment. A timeout was performed prior to the initiation of the procedure. Initial ultrasound scanning demonstrates a large amount of ascites within the left lower abdominal quadrant. The left lower abdomen was prepped and draped in the usual sterile fashion. 1% lidocaine was used for local anesthesia. Following this, a 11 G Yueh catheter was introduced. An ultrasound image was saved for documentation purposes. The paracentesis was performed. The catheter was removed and a dressing was applied. The patient tolerated the procedure well without immediate post procedural complication. Patient received post-procedure intravenous albumin; see nursing notes for details. FINDINGS: A total of  approximately 3.3 Liters of dark yellow fluid was removed. Samples were sent to the laboratory as requested by the clinical team. IMPRESSION: Successful ultrasound-guided paracentesis yielding 3.3 liters of peritoneal fluid. Read by Lavonia Drafts [facility] Electronically Signed   By: Corrie Mckusick D.O.   On: 05/04/2020 14:43   DG Chest Portable 1 View  Result Date: 05/04/2020 CLINICAL DATA:  Weakness.  Nausea, vomiting and diarrhea. EXAM: PORTABLE CHEST 1 VIEW COMPARISON:  03/28/2020 FINDINGS: Right jugular porta catheter tip in the right atrium approximately 4 cm inferior to the superior cavoatrial junction. Stable enlarged cardiac silhouette. Poor inspiration with mild bibasilar atelectasis. Diffuse osteopenia. IMPRESSION: 1. Poor inspiration with mild bibasilar atelectasis. 2. Stable cardiomegaly. Electronically Signed   By: Claudie Revering M.D.   On: 05/04/2020 12:42     ASSESSMENT AND PLAN:  1.  [name] 2.  Failure to  thrive 3.  Cirrhosis with ascites 4.  Leukocytosis secondary to polycythemia 5.  Diarrhea 6.  Dysphagia 7.  CAD 8.  Hypertension 9.  Hyperlipidemia 10.  Diabetes mellitus 11.  Stage IIIb CKD 12.  Obesity  -Labs from admission have been reviewed.  WBC has been as high as 35,000 this admission.  It is now down to 40,000 this morning.  1 month ago, she was 11,000.  Her platelet count was initially elevated during this admission but is now down to 250,000.  The patient has been off [place] secondary to difficulty swallowing the pill.  We may need to consider an alternative treatment for her polycythemia versus close observation for now.  She will be seen by Dr. [name] with further recommendations. -Management of cirrhosis per GI. -Recommend PT/OT evaluation.  The patient's husband is having difficulty taking care of her in the home.  She may need SNF versus long-term care placement. -Palliative care consult pending for goals of care discussion.  Mikey Bussing, DNP,  AGPCNP-BC, AOCNP Mon/Tues/Thurs/Fri 7am-5pm; Off Wednesdays Cell: [phone]   ADDENDUM: I agree with the above note from [place].  I looked at her blood smear.  Her blood smear looks as if she does have a little bit more active myeloproliferative issues.  She has mature polys.  There may be a couple immature myeloid cells.  I saw no blasts.  She had a rare nucleated red blood cell.  She has several hypersegmented polys.  I saw no eosinophilia or basophilia.  I know she is not taking her Jakafi.  I know she has had no problems in the past.  However, I think it is her performance status declines, this might be more of an issue.  I think the real problem is can be her cirrhosis.  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  She had ascites removed.  There is no infection with the ascites.  The cytology is also negative for malignancy.  There is no CBC back yet today.  I think that it for right now, I probably just hold off on any treatment for the polycythemia.  We will have to see how her white cell count trends.  If the white cell count trends downward, I think we can try to hold off on any intervention.  I very much appreciate the great care that she is getting from all the staff up on [facility].  It certainly would not be a bad idea for palliative care and possibly even Hospice to be involved.  Again, she will not succumb to this polycythemia.  I voiced I felt that it will be her diabetes and complications from the diabetes that will eventually get her.  Lattie Haw, MD  Psalms 100:5

## 2020-05-06 ENCOUNTER — Ambulatory Visit: Payer: Medicare HMO | Admitting: Gastroenterology

## 2020-05-06 DIAGNOSIS — R627 Adult failure to thrive: Secondary | ICD-10-CM | POA: Diagnosis not present

## 2020-05-06 DIAGNOSIS — D45 Polycythemia vera: Secondary | ICD-10-CM

## 2020-05-06 DIAGNOSIS — K7460 Unspecified cirrhosis of liver: Secondary | ICD-10-CM | POA: Diagnosis not present

## 2020-05-06 DIAGNOSIS — R197 Diarrhea, unspecified: Secondary | ICD-10-CM | POA: Diagnosis not present

## 2020-05-06 DIAGNOSIS — K7469 Other cirrhosis of liver: Secondary | ICD-10-CM

## 2020-05-06 DIAGNOSIS — R531 Weakness: Secondary | ICD-10-CM | POA: Diagnosis not present

## 2020-05-06 DIAGNOSIS — Z66 Do not resuscitate: Secondary | ICD-10-CM

## 2020-05-06 DIAGNOSIS — E1122 Type 2 diabetes mellitus with diabetic chronic kidney disease: Secondary | ICD-10-CM

## 2020-05-06 DIAGNOSIS — Z87891 Personal history of nicotine dependence: Secondary | ICD-10-CM

## 2020-05-06 DIAGNOSIS — N183 Chronic kidney disease, stage 3 unspecified: Secondary | ICD-10-CM

## 2020-05-06 DIAGNOSIS — N1832 Chronic kidney disease, stage 3b: Secondary | ICD-10-CM

## 2020-05-06 DIAGNOSIS — E669 Obesity, unspecified: Secondary | ICD-10-CM

## 2020-05-06 DIAGNOSIS — R188 Other ascites: Secondary | ICD-10-CM | POA: Diagnosis not present

## 2020-05-06 LAB — BASIC METABOLIC PANEL
Anion gap: 12 (ref 5–15)
BUN: 19 mg/dL (ref 8–23)
CO2: 21 mmol/L — ABNORMAL LOW (ref 22–32)
Calcium: 7.8 mg/dL — ABNORMAL LOW (ref 8.9–10.3)
Chloride: 99 mmol/L (ref 98–111)
Creatinine, Ser: 2.25 mg/dL — ABNORMAL HIGH (ref 0.44–1.00)
GFR calc Af Amer: 23 mL/min — ABNORMAL LOW (ref >60)
GFR calc non Af Amer: 20 mL/min — ABNORMAL LOW (ref >60)
Glucose, Bld: 140 mg/dL — ABNORMAL HIGH (ref 70–99)
Potassium: 2.9 mmol/L — ABNORMAL LOW (ref 3.5–5.1)
Sodium: 132 mmol/L — ABNORMAL LOW (ref 135–145)

## 2020-05-06 LAB — CBC WITH DIFFERENTIAL/PLATELET
Abs Immature Granulocytes: 0.67 10*3/uL — ABNORMAL HIGH (ref 0.00–0.07)
Basophils Absolute: 0.1 10*3/uL (ref 0.0–0.1)
Basophils Relative: 0 %
Eosinophils Absolute: 0 10*3/uL (ref 0.0–0.5)
Eosinophils Relative: 0 %
HCT: 33.8 % — ABNORMAL LOW (ref 36.0–46.0)
Hemoglobin: 10.6 g/dL — ABNORMAL LOW (ref 12.0–15.0)
Immature Granulocytes: 2 %
Lymphocytes Relative: 6 %
Lymphs Abs: 1.5 10*3/uL (ref 0.7–4.0)
MCH: 28.4 pg (ref 26.0–34.0)
MCHC: 31.4 g/dL (ref 30.0–36.0)
MCV: 90.6 fL (ref 80.0–100.0)
Monocytes Absolute: 1 10*3/uL (ref 0.1–1.0)
Monocytes Relative: 4 %
Neutro Abs: 24 10*3/uL — ABNORMAL HIGH (ref 1.7–7.7)
Neutrophils Relative %: 88 %
Platelets: 214 10*3/uL (ref 150–400)
RBC: 3.73 MIL/uL — ABNORMAL LOW (ref 3.87–5.11)
RDW: 21.2 % — ABNORMAL HIGH (ref 11.5–15.5)
WBC: 27.4 10*3/uL — ABNORMAL HIGH (ref 4.0–10.5)
nRBC: 0 % (ref 0.0–0.2)

## 2020-05-06 LAB — COMPREHENSIVE METABOLIC PANEL
ALT: 7 U/L (ref 0–44)
AST: 19 U/L (ref 15–41)
Albumin: 2.1 g/dL — ABNORMAL LOW (ref 3.5–5.0)
Alkaline Phosphatase: 109 U/L (ref 38–126)
Anion gap: 11 (ref 5–15)
BUN: 19 mg/dL (ref 8–23)
CO2: 23 mmol/L (ref 22–32)
Calcium: 7.8 mg/dL — ABNORMAL LOW (ref 8.9–10.3)
Chloride: 100 mmol/L (ref 98–111)
Creatinine, Ser: 2.18 mg/dL — ABNORMAL HIGH (ref 0.44–1.00)
GFR calc Af Amer: 24 mL/min — ABNORMAL LOW (ref >60)
GFR calc non Af Amer: 21 mL/min — ABNORMAL LOW (ref >60)
Glucose, Bld: 134 mg/dL — ABNORMAL HIGH (ref 70–99)
Potassium: 2.2 mmol/L — CL (ref 3.5–5.1)
Sodium: 134 mmol/L — ABNORMAL LOW (ref 135–145)
Total Bilirubin: 1.3 mg/dL — ABNORMAL HIGH (ref 0.3–1.2)
Total Protein: 4.4 g/dL — ABNORMAL LOW (ref 6.5–8.1)

## 2020-05-06 LAB — GLUCOSE, CAPILLARY
Glucose-Capillary: 128 mg/dL — ABNORMAL HIGH (ref 70–99)
Glucose-Capillary: 130 mg/dL — ABNORMAL HIGH (ref 70–99)
Glucose-Capillary: 126 mg/dL — ABNORMAL HIGH (ref 70–99)
Glucose-Capillary: 137 mg/dL — ABNORMAL HIGH (ref 70–99)

## 2020-05-06 MED ORDER — PROCHLORPERAZINE EDISYLATE 10 MG/2ML IJ SOLN: 5.0000 mg | Freq: Four times a day (QID) | INTRAMUSCULAR | Status: DC | PRN

## 2020-05-06 MED ORDER — POTASSIUM CHLORIDE 10 MEQ/100ML IV SOLN
10.0000 meq | INTRAVENOUS | Status: AC
  Administered 2020-05-06 (×5): 10 meq via INTRAVENOUS
  Filled 2020-05-06 (×5): qty 100

## 2020-05-06 MED ORDER — SODIUM CHLORIDE 0.9 % IV SOLN: INTRAVENOUS | Status: DC

## 2020-05-06 MED ORDER — POTASSIUM CHLORIDE CRYS ER 20 MEQ PO TBCR: 40.0000 meq | EXTENDED_RELEASE_TABLET | ORAL | Status: DC

## 2020-05-06 MED ORDER — PROCHLORPERAZINE EDISYLATE 10 MG/2ML IJ SOLN
5.0000 mg | Freq: Four times a day (QID) | INTRAMUSCULAR | Status: DC | PRN
  Administered 2020-05-06: 5 mg via INTRAVENOUS
  Filled 2020-05-06: qty 2

## 2020-05-06 NOTE — Progress Notes (Signed)
Patient ID: Amanda [name], female   DOB: 04/28/1941, 79 y.o.   MRN: [id number]  This NP visited patient at the bedside as a follow up to for palliative medicine needs and emotional support. Husband at bedside.  Patient is alert but remains weak.  Her po intake is poor.  Husband voices his worry regarding anticipatory care needs. Discussed with LCSW/Vanessa.  Dr Antonieta Pert note suggestive to hold off on treatment for now and consider hospice benefit.  Education offered regarding hospice benefit  Emotional support offered. Questions and concerns addressed   Discussed with patient/husband  the importance of continued conversation with each other and their  medical providers regarding overall plan of care and treatment options,  ensuring decisions are within the context of the patients values and GOCs.  Discussed with Dr Tyrell Antonio  Total time spent on the unit was 15 minutes  Greater than 50% of the time was spent in counseling and coordination of care  Wadie Lessen NP  Palliative Medicine Team Team Phone # [phone] Pager [phone]

## 2020-05-06 NOTE — Progress Notes (Addendum)
[facility] Progress Note  CC:  Cirrhosis, ascites, FTT  Subjective:  Complains of pain on her bottom.  Per nurse she has a Stage 2 ulcer on left buttock.  Has Gerhardt butt cream in place for her bottom as well.  Also complaining of pain in her arm where she is receiving IV potassium (nurse already reduced rate).  Objective:  Vital signs in last 24 hours: Temp:  [97.5 F (36.4 C)] 97.5 F (36.4 C) (07/20 0503) Pulse Rate:  [72-87] 87 (07/20 0503) Resp:  [16-18] 16 (07/20 0503) BP: (120-144)/(61-64) 120/64 (07/20 0503) SpO2:  [93 %-96 %] 96 % (07/20 0503) Weight:  [86.2 kg] 86.2 kg (07/19 2200) Last BM Date: 05/05/20 General:  Alert, chronically ill-appearing, in NAD Heart:  Regular rate and rhythm; no murmurs Pulm:  CTAB.  No increased WOB. Abdomen:  Soft, non-distended.  BS present.  Non-tender. Extremities:  Without edema. Neurologic:  Alert and oriented x 4;  grossly normal neurologically.  Intake/Output from previous day: 07/19 0701 - 07/20 0700 In: 1557.8 [P.O.:400; I.V.:1057.8; IV Piggyback:100] Out: 150 [Urine:150]  Lab Results: Recent Labs    05/04/20 0852 05/05/20 0510 05/06/20 0854  WBC 55.0* 40.4* 27.4*  HGB 13.8 12.0 10.6*  HCT 45.2 41.2 33.8*  PLT 542* 250 214   BMET Recent Labs    05/04/20 0219 05/05/20 0510  NA 134* 133*  K 3.0* 3.6  CL 102 105  CO2 17* 14*  GLUCOSE 158* 127*  BUN 17 21  CREATININE 1.43* 2.02*  CALCIUM 8.6* 8.0*   LFT Recent Labs    05/05/20 1548  PROT 5.1*  ALBUMIN 2.4*  AST 28  ALT 7  ALKPHOS 138*  BILITOT 1.8*  BILIDIR 0.6*  IBILI 1.2*   PT/INR Recent Labs    05/04/20 1932  LABPROT 18.5*  INR 1.6*   US RENAL  Result Date: 05/05/2020 CLINICAL DATA:  Acute CT in. EXAM: RENAL / URINARY TRACT ULTRASOUND COMPLETE COMPARISON:  None. FINDINGS: Right Kidney: Renal measurements: 10.9 x 4.9 x 5.2 cm = volume: 144 mL. Increased cortical echogenicity. No mass or hydronephrosis visualized. Left  Kidney: Renal measurements: 10.5 x 4.9 x 4.3 cm = volume: 111 mL. Increased cortical echogenicity. No mass or hydronephrosis visualized. Bladder: Appears normal for degree of bladder distention. Other: Incidental note of small volume ascites. IMPRESSION: 1. Increased cortical echogenicity, in keeping with medical renal disease. No hydronephrosis. 2.  Incidental note of small volume ascites. Electronically Signed   By: Eddie Candle M.D.   On: 05/05/2020 09:32   US Paracentesis  Result Date: 05/04/2020 INDICATION: Recurrent ascites NASH Cirrhosis EXAM: ULTRASOUND GUIDED LLQ PARACENTESIS MEDICATIONS: 10 cc 1% lidocaine COMPLICATIONS: None immediate. PROCEDURE: Informed written consent was obtained from the patient after a discussion of the risks, benefits and alternatives to treatment. A timeout was performed prior to the initiation of the procedure. Initial ultrasound scanning demonstrates a large amount of ascites within the left lower abdominal quadrant. The left lower abdomen was prepped and draped in the usual sterile fashion. 1% lidocaine was used for local anesthesia. Following this, a 13 G Yueh catheter was introduced. An ultrasound image was saved for documentation purposes. The paracentesis was performed. The catheter was removed and a dressing was applied. The patient tolerated the procedure well without immediate post procedural complication. Patient received post-procedure intravenous albumin; see nursing notes for details. FINDINGS: A total of approximately 3.3 Liters of dark yellow fluid was removed. Samples were sent to the laboratory as  requested by the clinical team. IMPRESSION: Successful ultrasound-guided paracentesis yielding 3.3 liters of peritoneal fluid. Read by Lavonia Drafts [facility] Electronically Signed   By: Corrie Mckusick D.O.   On: 05/04/2020 14:43   DG Chest Portable 1 View  Result Date: 05/04/2020 CLINICAL DATA:  Weakness.  Nausea, vomiting and diarrhea. EXAM: PORTABLE CHEST 1 VIEW  COMPARISON:  03/28/2020 FINDINGS: Right jugular porta catheter tip in the right atrium approximately 4 cm inferior to the superior cavoatrial junction. Stable enlarged cardiac silhouette. Poor inspiration with mild bibasilar atelectasis. Diffuse osteopenia. IMPRESSION: 1. Poor inspiration with mild bibasilar atelectasis. 2. Stable cardiomegaly. Electronically Signed   By: Claudie Revering M.D.   On: 05/04/2020 12:42   Assessment / Plan: *Decompensated NASH cirrhosis *Ascites:  S/p 3.3 Liter paracentesis on 7/18, negative for SBP.  Lasix 40 mg daily and spironolactone 12.5 mg daily at home, but they have been held here due to increase in Cr. *Leukocytosis:  55k on admission, down to 27.4k today.  Was up to 40k in June during admission and thought to be reactive.  ? Due to her polycythemia.  Oncology saw the patient here as well. *Acute on chronic kidney injury:  Cr up some more today at 2.18 as compared to 2.02 yesterday. *Coagulopathy secondary to cirrhosis:  INR 1.6. *Chronic antiplatelet use with Plavix due history of CAD *IDDM:  Well controlled with Hgb A1C 5.4 last month *Diarrhea, chronic:  Cdiff was ordered initally but has been cancelled. *Elevated ammonia at 64:  Could not tolerate lactulose due to diarrhea.  Cannot afford xifaxan at home but they have started it here. *Mild hyponatremia:  Na+ 134 today.  This appears to be chronic due to her liver disease. *Polycythemia:  Not taking her Jakafi because she cannot swallow big pills. 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  She is s/p EGD and small bowel enteroscopy recently.  Hgb down slightly this AM (likely dilutional) as no sign of bleeding. *FTT:  Has been declining significantly.  Palliative care consult has been placed as well. *Hypokalemia:  K+ 2.2 this AM.  Being replaced via IV.  -Urine sodium was ordered but not yet collected. -Monitor/trend labs. -Fluid studies for SBP were negative.  Does not need Rocephin for this  issue.  Can be discontinued from GI standpoint. -Not sure that we have anything else to add from GI standpoint for now.  GOC need to be established which palliative is working on.    LOS: 2 days   Laban Emperor. Zehr  05/06/2020, 9:38 AM  GI ATTENDING  Interval history data reviewed.  Agree with comprehensive interval progress note as outlined above.  Clinically stable with decompensated NASH cirrhosis and multiple additional significant medical problems.  No evidence for SBP on paracentesis.  Appreciate hematology/oncology input regarding leukocytosis.  Diarrhea currently not problematic.  Limited therapeutic options regarding fluid mobilization with renal insufficiency.  I would strongly consider that you obtain a nephrology opinion for additional input on this matter.  From a GI standpoint, no acute issues.  No additional recommendations to be offered.  However, we are available if needed for questions or new problems.  We will sign off.  Docia Chuck. Geri Seminole., M.D. [facility] Division of Gastroenterology

## 2020-05-06 NOTE — NC FL2 (Signed)
[place] LEVEL OF CARE SCREENING TOOL     IDENTIFICATION  Patient Name: Amanda [name] Birthdate: August 28, 1941 Sex: female Admission Date (Current Location): 05/04/2020  [place] [facility] and [place] Number:  Herbalist and Address:  [facility], [place] [address] [place],  [address]      Provider Number: [id number]  Attending Physician Name and Address:  Elmarie Shiley, MD  Relative Name and Phone Number:  Summar Mcglothlin - husband; [phone] (mobile), [phone] (home)    Current Level of Care: Hospital Recommended Level of Care: [place] Prior Approval Number:    Date Approved/Denied:   PASRR Number: [id number]  Discharge Plan: SNF    Current Diagnoses: Patient Active Problem List   Diagnosis Date Noted  . DNR (do not resuscitate)   . Severe diabetic hypoglycemia ([place]) 03/28/2020  . Acute hypokalemia 03/28/2020  . Hypokalemia   . Gastritis and gastroduodenitis   . Acute GI bleeding 02/17/2020  . Weakness generalized   . AVM (arteriovenous malformation) of small bowel, acquired with hemorrhage   . Malnutrition of moderate degree 01/16/2020  . Goals of care, counseling/discussion   . Palliative care by specialist   . Nausea & vomiting 01/13/2020  . Failure to thrive in adult 01/13/2020  . Myeloproliferative disorder ([place]) 01/13/2020  . Portal vein thrombosis   . SIRS (systemic inflammatory response syndrome) ([place]) 12/28/2019  . Worsening lower extremity weakness 12/14/2019  . Leg swelling 08/21/2019  . Hypotension due to hypovolemia 08/10/2019  . Stage 3b chronic kidney disease 08/10/2019  . Acute blood loss anemia 05/23/2018  . Dehydration 03/22/2018  . AKI (acute kidney injury) ([place]) 03/22/2018  . History of CVA (cerebrovascular accident) 03/22/2018  . Hip injury, left, subsequent encounter 11/08/2017  . Iron deficiency anemia 12/22/2016  . OSA (obstructive sleep apnea) 04/21/2016  .  Osteopenia 04/07/2016  . Lung nodule, solitary 02/05/2016  . Aortic dilatation ([place]) 02/05/2016  . Dyspnea 01/28/2016  . Cirrhosis ([place]) 01/28/2016  . Allergic rhinitis 01/23/2016  . Hepatic cirrhosis ([place]) 10/28/2015  . Irritable bowel syndrome 08/12/2015  . Obesity (BMI 30-39.9) 04/30/2015  . Diabetic retinopathy of both eyes ([place]) 04/18/2015  . Former smoker 04/18/2015  . GAD (generalized anxiety disorder) 04/18/2015  . Status post primary angioplasty with coronary stent 04/18/2015  . Coronary artery disease involving native coronary artery 03/01/2015  . Chronic diastolic CHF (congestive heart failure) ([place]) 02/01/2015  . ST elevation myocardial infarction (STEMI) involving left circumflex coronary artery in recovery phase ([place]) 01/27/2015  . Idioventricular rhythm ([place])   . Diabetic peripheral neuropathy associated with type 2 diabetes mellitus ([place]) 01/23/2015  . Type 2 diabetes mellitus with diabetic polyneuropathy ([place]) 01/23/2015  . Thrombocytosis ([place]) 07/04/2014  . Cholelithiasis 02/07/2014  . Chronic diarrhea 01/29/2014  . Leukocytosis 06/02/2013  . [name] ([place]) 05/31/2013  . Essential hypertension 05/31/2013  . History of Bell's palsy 05/26/2013  . DERMATITIS, ATOPIC 11/16/2010  . DIZZINESS 11/16/2010  . DIASTOLIC DYSFUNCTION 29/92/4268  . Hyperlipidemia 12/15/2009  . Essential hypertension, benign 11/24/2009  . PSORIASIS 11/24/2009  . Depression 09/13/2009  . 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[place]) 07/08/2008    Orientation RESPIRATION BLADDER Height & Weight     Self, Place  Normal Incontinent, External catheter (catheter placed 04/01/20) Weight: 190 lb 0.6 oz (86.2 kg) Height:  5' 4"  (162.6 cm)  BEHAVIORAL SYMPTOMS/MOOD NEUROLOGICAL BOWEL NUTRITION STATUS      Incontinent Diet (Regular)  AMBULATORY STATUS COMMUNICATION OF NEEDS  Skin   Total Care (Patient did not ambulate with PT. Uses a wheelchair at home) Verbally  (Ecchymosis arm,  Fissure buttocks, MASD buttocks medial)                       Personal Care Assistance Level of Assistance  Bathing, Feeding, Dressing Bathing Assistance: Maximum assistance Feeding assistance: Limited assistance (Assistance with set-up) Dressing Assistance: Maximum assistance     Functional Limitations Info  Sight, Hearing, Speech Sight Info: Adequate Hearing Info: Adequate Speech Info: Adequate    SPECIAL CARE FACTORS FREQUENCY  Speech therapy, PT (By licensed PT), OT (By licensed OT)     PT Frequency: Evaluated 7/19. PT at SNF eval and treat, a minimum of 5 days per week OT Frequency: Evaluated 7/19. OT at SNF eval and treat, a minimum of 5 days per week     Speech Therapy Frequency: Speech eval 7/19 and speech pathology services recommended; Speech swallow eval 7/19 and regular diet, thin liquid recommended      Contractures Contractures Info: Not present    Additional Factors Info  Code Status, Allergies Code Status Info: DNR Allergies Info: Doxycycline, Penicillins, Lisinopril, Tape, Latex           Current Medications (05/06/2020):  This is the current hospital active medication list Current Facility-Administered Medications  Medication Dose Route Frequency Provider Last Rate Last Admin  . 0.9 %  sodium chloride infusion   Intravenous Continuous Regalado, Belkys A, MD 50 mL/hr at 05/06/20 1057 New Bag at 05/06/20 1057  . acetaminophen (TYLENOL) tablet 650 mg  650 mg Oral Q6H PRN Karmen Bongo, MD       Or  . acetaminophen (TYLENOL) suppository 650 mg  650 mg Rectal Q6H PRN Karmen Bongo, MD      . albuterol (PROVENTIL) (2.5 MG/3ML) 0.083% nebulizer solution 2.5 mg  2.5 mg Nebulization Q2H PRN Karmen Bongo, MD      . cefTRIAXone (ROCEPHIN) 2 g in sodium chloride 0.9 % 100 mL IVPB  2 g Intravenous Q24H Karmen Bongo, MD 200 mL/hr at 05/05/20 2012 2 g at 05/05/20 2012  . Chlorhexidine Gluconate Cloth 2 % PADS 6 each  6 each Topical Q0600 Karmen Bongo, MD   6 each at 05/05/20 [phone]  . clopidogrel (PLAVIX) tablet 75 mg  75 mg Oral Daily Karmen Bongo, MD   75 mg at 05/06/20 0939  . diphenoxylate-atropine (LOMOTIL) 2.5-0.025 MG per tablet 1 tablet  1 tablet Oral QID PRN Karmen Bongo, MD      . enoxaparin (LOVENOX) injection 30 mg  30 mg Subcutaneous Q24H Lutheran, Carrie F, [place]      . ezetimibe (ZETIA) tablet 10 mg  10 mg Oral Daily Karmen Bongo, MD   10 mg at 05/06/20 2979  . feeding supplement (ENSURE ENLIVE) (ENSURE ENLIVE) liquid 237 mL  237 mL Oral TID BM Regalado, Belkys A, MD   237 mL at 05/06/20 1101  . Gerhardt's butt cream   Topical BID Elmarie Shiley, MD   Given at 05/06/20 0933  . insulin aspart (novoLOG) injection 0-9 Units  0-9 Units Subcutaneous TID WC Karmen Bongo, MD   2 Units at 05/06/20 0730  . latanoprost (XALATAN) 0.005 % ophthalmic solution 1 drop  1 drop Both Eyes QHS Karmen Bongo, MD   1 drop at 05/04/20 2251  . loperamide (IMODIUM) capsule 2 mg  2 mg Oral QID PRN Karmen Bongo, MD      . multivitamin with minerals  tablet 1 tablet  1 tablet Oral Daily Regalado, Belkys A, MD   1 tablet at 05/06/20 0939  . mupirocin ointment (BACTROBAN) 2 % 1 application  1 application Nasal BID Karmen Bongo, MD   1 application at 52/71/29 [phone]  . ondansetron (ZOFRAN) tablet 4 mg  4 mg Oral Q6H PRN Karmen Bongo, MD       Or  . ondansetron [facility]) injection 4 mg  4 mg Intravenous Q6H PRN Karmen Bongo, MD   4 mg at 05/06/20 0944  . pantoprazole (PROTONIX) EC tablet 40 mg  40 mg Oral Daily Karmen Bongo, MD   40 mg at 05/06/20 0939  . potassium chloride 10 mEq in 100 mL IVPB  10 mEq Intravenous Q1 Hr x 5 Regalado, Belkys A, MD 100 mL/hr at 05/06/20 1245 10 mEq at 05/06/20 1245  . prochlorperazine (COMPAZINE) injection 5 mg  5 mg Intravenous Q6H PRN Regalado, Belkys A, MD      . rifaximin (XIFAXAN) tablet 550 mg  550 mg Oral BID Karmen Bongo, MD   550 mg at 05/06/20 0301  . sodium chloride flush (NS)  0.9 % injection 10-40 mL  10-40 mL Intracatheter PRN Regalado, Belkys A, MD      . sodium chloride flush (NS) 0.9 % injection 3 mL  3 mL Intravenous Q12H Karmen Bongo, MD   3 mL at 05/06/20 0948  . timolol (TIMOPTIC) 0.5 % ophthalmic solution 1 drop  1 drop Both Eyes Daily Karmen Bongo, MD   1 drop at 05/06/20 4996     Discharge Medications: Please see discharge summary for a list of discharge medications.  Relevant Imaging Results:  Relevant Lab Results:   Additional Information ss# [id number]  Sable Feil, LCSW

## 2020-05-06 NOTE — TOC Initial Note (Signed)
Transition of Care [facility]) - Initial/Assessment Note    Patient Details  Name: Amanda [name] MRN: [id number] Date of Birth: 10-Mar-1941  Transition of Care [facility]) CM/SW Contact:    Amanda Feil, LCSW Phone Number: 05/06/2020, 12:56 PM  Clinical Narrative: Talked with spouse by phone [phone]) regarding patient's readiness for discharge and recommendation by therapy of ST rehab. Amanda [name] expressed agreement regarding ST rehab and his preference is Amanda [name]. When asked, spouse agreeable to patient's information beng sent to all SNF's in [facility] and patient made aware that he would be making the final choice on where patient will go. Spouse added that he can no longer care for his wife at home and LTC and applying for Medicaid discussed.                 Expected Discharge Plan: Skilled Nursing Facility Barriers to Discharge: Insurance Authorization, Other (comment) (Faxed out 7/20 and will need to confirm facility and initiate insurance authorization)   Patient Goals and CMS Choice Patient states their goals for this hospitalization and ongoing recovery are:: Husband understands and agrees that his wife needs rehab. Amanda [name] also indicated that he can't care of patient at home CMS Medicare.gov Compare Post Acute Care list provided to:: Other (Comment Required) (Spouse provided CSW with facility preference initially.) Choice offered to / list presented to : Spouse (Patient provided facility preference. CSW will discuss other SNF options and Medicare.gov if needed)  Expected Discharge Plan and Services Expected Discharge Plan: [place] arrangements for the past 2 months: Apartment                                     Prior Living Arrangements/Services Living arrangements for the past 2 months: Apartment Lives with:: Spouse Patient language and need for interpreter reviewed:: No Do you feel safe going back to the place where  you live?: No   Spouse reported patient needs ST rehab and then continung nursing care  Need for Family Participation in Patient Care: Yes (Comment) Care giver support system in place?: No (comment)   Criminal Activity/Legal Involvement Pertinent to Current Situation/Hospitalization: No - Comment as needed  Activities of Daily Living Home Assistive Devices/Equipment: Gilford Rile (specify type) ADL Screening (condition at time of admission) Patient's cognitive ability adequate to safely complete daily activities?: Yes Is the patient deaf or have difficulty hearing?: No Does the patient have difficulty seeing, even when wearing glasses/contacts?: No Does the patient have difficulty concentrating, remembering, or making decisions?: No Patient able to express need for assistance with ADLs?: Yes Does the patient have difficulty dressing or bathing?: No Independently performs ADLs?: No Bathing: Needs assistance Is this a change from baseline?: Pre-admission baseline In/Out Bed: Needs assistance Is this a change from baseline?: Pre-admission baseline Does the patient have difficulty walking or climbing stairs?: No Weakness of Legs: Both Weakness of Arms/Hands: None  Permission Sought/Granted Permission sought to share information with : Other (comment) (Patient not oriented to situation. Talked with spouse) Permission granted to share information with : No (Patient not oriented to situation)              Emotional Assessment Appearance:: Appears stated age (Looked into room before calling spouse) Attitude/Demeanor/Rapport: Unable to Assess Affect (typically observed): Unable to Assess Orientation: : Oriented to Self, Oriented to Place Alcohol / Substance Use: Tobacco Use, Alcohol  Use, Illicit Drugs (Per H&P patient quit smoking and does not drink or use illicit drugs) Psych Involvement: No (comment)  Admission diagnosis:  Diarrhea of presumed infectious origin [R19.7] Hypokalemia  [E87.6] Delirium [R41.0] Generalized abdominal pain [R10.84] Failure to thrive in adult [R62.7] Cirrhosis of liver with ascites, unspecified hepatic cirrhosis type ([place]) [K74.60, R18.8] Leukocytosis, unspecified type [D72.829] Patient Active Problem List   Diagnosis Date Noted  . DNR (do not resuscitate)   . Severe diabetic hypoglycemia ([place]) 03/28/2020  . Acute hypokalemia 03/28/2020  . Hypokalemia   . Gastritis and gastroduodenitis   . Acute GI bleeding 02/17/2020  . Weakness generalized   . AVM (arteriovenous malformation) of small bowel, acquired with hemorrhage   . Malnutrition of moderate degree 01/16/2020  . Goals of care, counseling/discussion   . Palliative care by specialist   . Nausea & vomiting 01/13/2020  . Failure to thrive in adult 01/13/2020  . Myeloproliferative disorder ([place]) 01/13/2020  . Portal vein thrombosis   . SIRS (systemic inflammatory response syndrome) ([place]) 12/28/2019  . Worsening lower extremity weakness 12/14/2019  . Leg swelling 08/21/2019  . Hypotension due to hypovolemia 08/10/2019  . Stage 3b chronic kidney disease 08/10/2019  . Acute blood loss anemia 05/23/2018  . Dehydration 03/22/2018  . AKI (acute kidney injury) ([place]) 03/22/2018  . History of CVA (cerebrovascular accident) 03/22/2018  . Hip injury, left, subsequent encounter 11/08/2017  . Iron deficiency anemia 12/22/2016  . OSA (obstructive sleep apnea) 04/21/2016  . Osteopenia 04/07/2016  . Lung nodule, solitary 02/05/2016  . Aortic dilatation ([place]) 02/05/2016  . Dyspnea 01/28/2016  . Cirrhosis ([place]) 01/28/2016  . Allergic rhinitis 01/23/2016  . Hepatic cirrhosis ([place]) 10/28/2015  . Irritable bowel syndrome 08/12/2015  . Obesity (BMI 30-39.9) 04/30/2015  . Diabetic retinopathy of both eyes ([place]) 04/18/2015  . Former smoker 04/18/2015  . GAD (generalized anxiety disorder) 04/18/2015  . Status post primary angioplasty with coronary stent 04/18/2015  . Coronary artery disease  involving native coronary artery 03/01/2015  . Chronic diastolic CHF (congestive heart failure) ([place]) 02/01/2015  . ST elevation myocardial infarction (STEMI) involving left circumflex coronary artery in recovery phase ([place]) 01/27/2015  . Idioventricular rhythm ([place])   . 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 Type 2 diabetes mellitus with diabetic polyneuropathy ([place]) 01/23/2015  . Thrombocytosis ([place]) 07/04/2014  . Cholelithiasis 02/07/2014  . Chronic diarrhea 01/29/2014  . Leukocytosis 06/02/2013  . [name] ([place]) 05/31/2013  . Essential hypertension 05/31/2013  . History of Bell's palsy 05/26/2013  . DERMATITIS, ATOPIC 11/16/2010  . DIZZINESS 11/16/2010  . DIASTOLIC DYSFUNCTION 29/11/1113  . Hyperlipidemia 12/15/2009  . Essential hypertension, benign 11/24/2009  . PSORIASIS 11/24/2009  . Depression 09/13/2009  . 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[place]) 07/08/2008   PCP:  [name], NP Pharmacy:   [facility] [place], [place] - [address] AT [address] [place] [address] Phone: [phone] Fax: [phone]  [facility] [place], [place] [facility] [place] [address] [place] [address] Phone: [phone] Fax: [phone]  [place], [place] - [address], [place] [address] [place] [address], [place] [address] Phone: [phone] Fax: [phone]     Social Determinants of Health (SDOH) Interventions  No SDOH interventions recommended or needed at this time.  Readmission Risk Interventions Readmission Risk Prevention Plan 03/31/2020 01/17/2020  Transportation Screening Complete Complete  PCP or Specialist Appt within 3-5 Days - Not Complete  Not Complete comments - plan for SNF  HRI or [place] - Complete  Social Work Consult for [place]  Planning/Counseling - Complete  Palliative Care Screening - Complete  Medication Review (RN Care Manager) Referral to Pharmacy Referral to Pharmacy  PCP or Specialist appointment within 3-5 days of discharge Complete -  [place] or Home Care Consult Complete -  SW Recovery Care/Counseling Consult Complete -  Palliative Care Screening Not Applicable -  [place] Complete -  Some recent data might be hidden

## 2020-05-06 NOTE — Progress Notes (Signed)
PROGRESS NOTE    Amanda [name]  ZOX:[id number] DOB: 04/30/1941 DOA: 05/04/2020 PCP: [name], NP   Brief Narrative: 79 year old with past medical history significant for polycythemia, CAD s/p stents, hypertension, HLD, diabetes, obesity, cirrhosis who presented with nausea dry heaving and diarrhea.  Was complaining of shortness of breath, not feeling well.  Poor appetite.  Patient was recently released from a skilled nursing facility, as she was not doing well, when she was relieved from a skilled nursing facility.  Patient admitted with failure to thrive, ascites, she underwent paracentesis on 7/18 yielding 3 L of fluid.  Patient presented with worsening leukocytosis probably related to active myeloproliferative disorder.  Dr. [name] consulted and following.  He is recommending to continue to monitor white blood cell trends plan to hold off on any treatment for polycythemia at this point.  Patient also develop acute on chronic stage III kidney disease, post paracentesis.  She also developed metabolic acidosis.  She was started on bicarb drip.  IV fluids changed to normal saline today.  Continue to monitor renal function. Patient today is complaining of nausea and vomiting.  Compazine added as needed.    Assessment & Plan:   Principal Problem:   Failure to thrive in adult Active Problems:   Hyperlipidemia   [name] (HCC)   Essential hypertension   Leukocytosis   Obesity (BMI 30-39.9)   Hepatic cirrhosis (HCC)   Chronic diarrhea   Type 2 diabetes mellitus with diabetic polyneuropathy (HCC)   Stage 3b chronic kidney disease   DNR (do not resuscitate)  1-Failure to Thrive: -Probably related to underlying chronic medical disease. -Palliative care consulted. -Holding Remeron and Celexa given prolonged QT. -PT OT evaluation. -Improvement of nausea after paracentesis.  2-Cirrhosis with ascites Meld score 27/29 Underwent paracentesis, no evidence of SBP on ascites  fluid GI Consulted. Recommended support care.  Continue with rifaximin. Holding diuretic due to AKI.  3-Leukocytosis, polycythemia vera area: We will continue with ceftriaxone for now Dr. [name] consulted.  Recommendation to monitor white blood cell counts trends.  Hold off on any treatment for polycythemia. Dr. [name] agree with palliative care and possible hospice care. WBC 50---40--27.  4-Chronic diarrhea: Patient reports 1 bowel movement daily. Patient with no significant diarrhea during hospitalization.  Discontinue C. difficile  5-Dysphagia Speech consulted On regular diet.  6-CAD status post stent: Continue with Plavix  7-Hypertension: Hold Cozaar and Norvasc.  8-AKI on CKD stage IIIb; Metabolic acidosis;  Cr baseline 1.5--1.2 Renal US; no hydronephrosis.  Started IV bicarb low rate.  Metabolic acidosis improved.  Transition fluids to normal saline. Creatinine is stable at 2.  9-nausea and vomiting: Started Compazine.  10-hypokalemia: Replete IV.  Repeat be met this afternoon   Estimated body mass index is 32.62 kg/m as calculated from the following:   Height as of this encounter: 5' 4"  (1.626 m).   Weight as of this encounter: 86.2 kg.   DVT prophylaxis: Lovenox Code Status: DNR Family Communication: care discussed with husband who was at bedside 7/19 Disposition Plan:  Status is: Inpatient  Remains inpatient appropriate because:Persistent severe electrolyte disturbances and Ongoing active pain requiring inpatient pain management   Dispo: The patient is from: Home              Anticipated d/c is to: SNF              Anticipated d/c date is: 2 days              Patient  currently is not medically stable to d/c.  Patient with persistent nausea and vomiting        Consultants:   GI  Palliative  Procedures:     Antimicrobials:    Subjective: She report nausea and vomiting today.  Denies abdominal pain. Had 1 episode of mucus smear.    Objective: Vitals:   05/05/20 1700 05/05/20 2200 05/06/20 0503 05/06/20 0952  BP: (!) 144/61  120/64 124/78  Pulse: 72  87 85  Resp: 18  16 20   Temp:   (!) 97.5 F (36.4 C) 98.5 F (36.9 C)  TempSrc:   Oral   SpO2: 93%  96% 98%  Weight:  86.2 kg    Height:        Intake/Output Summary (Last 24 hours) at 05/06/2020 1439 Last data filed at 05/06/2020 0801 Gross per 24 hour  Intake 1257.81 ml  Output 0 ml  Net 1257.81 ml   Filed Weights   05/04/20 0938 05/05/20 2200  Weight: 90.7 kg 86.2 kg    Examination:  General exam: No acute distress Respiratory system: Clear to auscultation Cardiovascular system: S1-S2 regular rhythm or rate Gastrointestinal system: BS present, soft, nt Central nervous system: non focal.  Extremities: Symmetric power   Data Reviewed: I have personally reviewed following labs and imaging studies  CBC: Recent Labs  Lab 05/04/20 0219 05/04/20 0852 05/05/20 0510 05/06/20 0854  WBC 45.8* 55.0* 40.4* 27.4*  NEUTROABS  --  52.8*  --  24.0*  HGB 13.8 13.8 12.0 10.6*  HCT 44.7 45.2 41.2 33.8*  MCV 91.2 92.1 99.0 90.6  PLT 566* 542* 250 867   Basic Metabolic Panel: Recent Labs  Lab 05/04/20 0219 05/05/20 0510 05/06/20 0854  NA 134* 133* 134*  K 3.0* 3.6 2.2*  CL 102 105 100  CO2 17* 14* 23  GLUCOSE 158* 127* 134*  BUN 17 21 19   CREATININE 1.43* 2.02* 2.18*  CALCIUM 8.6* 8.0* 7.8*   GFR: Estimated Creatinine Clearance: 21.9 mL/min (A) (by C-G formula based on SCr of 2.18 mg/dL (H)). Liver Function Tests: Recent Labs  Lab 05/04/20 0219 05/05/20 1548 05/06/20 0854  AST 24 28 19   ALT 7 7 7   ALKPHOS 160* 138* 109  BILITOT 2.6* 1.8* 1.3*  PROT 6.3* 5.1* 4.4*  ALBUMIN 2.8* 2.4* 2.1*   Recent Labs  Lab 05/04/20 0219  LIPASE 27   Recent Labs  Lab 05/04/20 0852  AMMONIA 64*   Coagulation Profile: Recent Labs  Lab 05/04/20 1932  INR 1.6*   Cardiac Enzymes: No results for input(s): CKTOTAL, CKMB, CKMBINDEX, TROPONINI  in the last 168 hours. BNP (last 3 results) No results for input(s): PROBNP in the last 8760 hours. HbA1C: No results for input(s): HGBA1C in the last 72 hours. CBG: Recent Labs  Lab 05/05/20 1113 05/05/20 1627 05/05/20 2052 05/06/20 0644 05/06/20 1120  GLUCAP 136* 117* 137* 128* 130*   Lipid Profile: No results for input(s): CHOL, HDL, LDLCALC, TRIG, CHOLHDL, LDLDIRECT in the last 72 hours. Thyroid Function Tests: No results for input(s): TSH, T4TOTAL, FREET4, T3FREE, THYROIDAB in the last 72 hours. Anemia Panel: No results for input(s): VITAMINB12, FOLATE, FERRITIN, TIBC, IRON, RETICCTPCT in the last 72 hours. Sepsis Labs: No results for input(s): PROCALCITON, LATICACIDVEN in the last 168 hours.  Recent Results (from the past 240 hour(s))  SARS Coronavirus 2 by RT PCR (hospital order, performed in [facility] hospital lab) Nasopharyngeal Nasopharyngeal Swab     Status: None   Collection Time: 05/04/20 12:29  PM   Specimen: Nasopharyngeal Swab  Result Value Ref Range Status   SARS Coronavirus 2 NEGATIVE NEGATIVE Final    Comment: (NOTE) SARS-CoV-2 target nucleic acids are NOT DETECTED.  The SARS-CoV-2 RNA is generally detectable in upper and lower respiratory specimens during the acute phase of infection. The lowest concentration of SARS-CoV-2 viral copies this assay can detect is 250 copies / mL. A negative result does not preclude SARS-CoV-2 infection and should not be used as the sole basis for treatment or other patient management decisions.  A negative result may occur with improper specimen collection / handling, submission of specimen other than nasopharyngeal swab, presence of viral mutation(s) within the areas targeted by this assay, and inadequate number of viral copies (<250 copies / mL). A negative result must be combined with clinical observations, patient history, and epidemiological information.  Fact Sheet for Patients:    StrictlyIdeas.no  Fact Sheet for Healthcare Providers: BankingDealers.co.za  This test is not yet approved or  cleared by the [place] FDA and has been authorized for detection and/or diagnosis of SARS-CoV-2 by FDA under an Emergency Use Authorization (EUA).  This EUA will remain in effect (meaning this test can be used) for the duration of the COVID-19 declaration under Section 564(b)(1) of the Act, 21 U.S.C. section 360bbb-3(b)(1), unless the authorization is terminated or revoked sooner.  Performed at [facility], [place] [address], [place], [place] [address]   Gram stain     Status: None   Collection Time: 05/04/20  2:39 PM   Specimen: PATH Cytology Peritoneal fluid  Result Value Ref Range Status   Specimen Description PERITONEAL  Final   Special Requests NONE  Final   Gram Stain   Final    CYTOSPIN SMEAR WBC PRESENT,BOTH PMN AND MONONUCLEAR NO ORGANISMS SEEN Performed at [facility], [place] [address] [place], [place] [address]    Report Status 05/04/2020 FINAL  Final  Culture, body fluid-bottle     Status: None (Preliminary result)   Collection Time: 05/04/20  2:39 PM   Specimen: Peritoneal Washings  Result Value Ref Range Status   Specimen Description PERITONEAL  Final   Special Requests NONE  Final   Culture   Final    NO GROWTH 2 DAYS Performed at [facility] [address] [place], [place] [address]    Report Status PENDING  Incomplete         Radiology Studies: US RENAL  Result Date: 05/05/2020 CLINICAL DATA:  Acute CT in. EXAM: RENAL / URINARY TRACT ULTRASOUND COMPLETE COMPARISON:  None. FINDINGS: Right Kidney: Renal measurements: 10.9 x 4.9 x 5.2 cm = volume: 144 mL. Increased cortical echogenicity. No mass or hydronephrosis visualized. Left Kidney: Renal measurements: 10.5 x 4.9 x 4.3 cm = volume: 111 mL. Increased cortical echogenicity. No mass or hydronephrosis visualized. Bladder:  Appears normal for degree of bladder distention. Other: Incidental note of small volume ascites. IMPRESSION: 1. Increased cortical echogenicity, in keeping with medical renal disease. No hydronephrosis. 2.  Incidental note of small volume ascites. Electronically Signed   By: Eddie Candle M.D.   On: 05/05/2020 09:32   US Paracentesis  Result Date: 05/04/2020 INDICATION: Recurrent ascites NASH Cirrhosis EXAM: ULTRASOUND GUIDED LLQ PARACENTESIS MEDICATIONS: 10 cc 1% lidocaine COMPLICATIONS: None immediate. PROCEDURE: Informed written consent was obtained from the patient after a discussion of the risks, benefits and alternatives to treatment. A timeout was performed prior to the initiation of the procedure. Initial ultrasound scanning  demonstrates a large amount of ascites within the left lower abdominal quadrant. The left lower abdomen was prepped and draped in the usual sterile fashion. 1% lidocaine was used for local anesthesia. Following this, a 14 G Yueh catheter was introduced. An ultrasound image was saved for documentation purposes. The paracentesis was performed. The catheter was removed and a dressing was applied. The patient tolerated the procedure well without immediate post procedural complication. Patient received post-procedure intravenous albumin; see nursing notes for details. FINDINGS: A total of approximately 3.3 Liters of dark yellow fluid was removed. Samples were sent to the laboratory as requested by the clinical team. IMPRESSION: Successful ultrasound-guided paracentesis yielding 3.3 liters of peritoneal fluid. Read by Lavonia Drafts [facility] Electronically Signed   By: Corrie Mckusick D.O.   On: 05/04/2020 14:43        Scheduled Meds:  Chlorhexidine Gluconate Cloth  6 each Topical Q0600   clopidogrel  75 mg Oral Daily   enoxaparin (LOVENOX) injection  30 mg Subcutaneous Q24H   ezetimibe  10 mg Oral Daily   feeding supplement (ENSURE ENLIVE)  237 mL Oral TID BM   Gerhardt's butt  cream   Topical BID   insulin aspart  0-9 Units Subcutaneous TID WC   latanoprost  1 drop Both Eyes QHS   multivitamin with minerals  1 tablet Oral Daily   mupirocin ointment  1 application Nasal BID   pantoprazole  40 mg Oral Daily   rifaximin  550 mg Oral BID   sodium chloride flush  3 mL Intravenous Q12H   timolol  1 drop Both Eyes Daily   Continuous Infusions:  sodium chloride 50 mL/hr at 05/06/20 1057   cefTRIAXone (ROCEPHIN)  IV 2 g (05/05/20 2012)   potassium chloride 10 mEq (05/06/20 1245)     LOS: 2 days    Time spent: 35 minutes    Viraj Liby A Jaquawn Saffran, MD [facility]   If 7PM-7AM, please contact night-coverage [email or web address]  05/06/2020, 2:39 PM

## 2020-05-07 DIAGNOSIS — N179 Acute kidney failure, unspecified: Secondary | ICD-10-CM

## 2020-05-07 LAB — COMPREHENSIVE METABOLIC PANEL
ALT: 6 U/L (ref 0–44)
AST: 17 U/L (ref 15–41)
Albumin: 2 g/dL — ABNORMAL LOW (ref 3.5–5.0)
Alkaline Phosphatase: 106 U/L (ref 38–126)
Anion gap: 10 (ref 5–15)
BUN: 18 mg/dL (ref 8–23)
CO2: 22 mmol/L (ref 22–32)
Calcium: 7.7 mg/dL — ABNORMAL LOW (ref 8.9–10.3)
Chloride: 100 mmol/L (ref 98–111)
Creatinine, Ser: 2.35 mg/dL — ABNORMAL HIGH (ref 0.44–1.00)
GFR calc Af Amer: 22 mL/min — ABNORMAL LOW (ref >60)
GFR calc non Af Amer: 19 mL/min — ABNORMAL LOW (ref >60)
Glucose, Bld: 121 mg/dL — ABNORMAL HIGH (ref 70–99)
Potassium: 2.5 mmol/L — CL (ref 3.5–5.1)
Sodium: 132 mmol/L — ABNORMAL LOW (ref 135–145)
Total Bilirubin: 1.3 mg/dL — ABNORMAL HIGH (ref 0.3–1.2)
Total Protein: 4.4 g/dL — ABNORMAL LOW (ref 6.5–8.1)

## 2020-05-07 LAB — GLUCOSE, CAPILLARY
Glucose-Capillary: 112 mg/dL — ABNORMAL HIGH (ref 70–99)
Glucose-Capillary: 116 mg/dL — ABNORMAL HIGH (ref 70–99)
Glucose-Capillary: 131 mg/dL — ABNORMAL HIGH (ref 70–99)
Glucose-Capillary: 156 mg/dL — ABNORMAL HIGH (ref 70–99)

## 2020-05-07 LAB — CBC WITH DIFFERENTIAL/PLATELET
Abs Immature Granulocytes: 0.48 10*3/uL — ABNORMAL HIGH (ref 0.00–0.07)
Basophils Absolute: 0.1 10*3/uL (ref 0.0–0.1)
Basophils Relative: 0 %
Eosinophils Absolute: 0 10*3/uL (ref 0.0–0.5)
Eosinophils Relative: 0 %
HCT: 33.2 % — ABNORMAL LOW (ref 36.0–46.0)
Hemoglobin: 10.6 g/dL — ABNORMAL LOW (ref 12.0–15.0)
Immature Granulocytes: 2 %
Lymphocytes Relative: 7 %
Lymphs Abs: 1.5 10*3/uL (ref 0.7–4.0)
MCH: 29 pg (ref 26.0–34.0)
MCHC: 31.9 g/dL (ref 30.0–36.0)
MCV: 91 fL (ref 80.0–100.0)
Monocytes Absolute: 0.8 10*3/uL (ref 0.1–1.0)
Monocytes Relative: 4 %
Neutro Abs: 19.1 10*3/uL — ABNORMAL HIGH (ref 1.7–7.7)
Neutrophils Relative %: 87 %
Platelets: 174 10*3/uL (ref 150–400)
RBC: 3.65 MIL/uL — ABNORMAL LOW (ref 3.87–5.11)
RDW: 21.1 % — ABNORMAL HIGH (ref 11.5–15.5)
WBC: 22 10*3/uL — ABNORMAL HIGH (ref 4.0–10.5)
nRBC: 0 % (ref 0.0–0.2)

## 2020-05-07 LAB — MAGNESIUM: Magnesium: 1.4 mg/dL — ABNORMAL LOW (ref 1.7–2.4)

## 2020-05-07 LAB — LACTATE DEHYDROGENASE: LDH: 148 U/L (ref 98–192)

## 2020-05-07 LAB — PATHOLOGIST SMEAR REVIEW

## 2020-05-07 MED ORDER — MAGNESIUM SULFATE 2 GM/50ML IV SOLN
2.0000 g | Freq: Once | INTRAVENOUS | Status: AC
  Administered 2020-05-07: 2 g via INTRAVENOUS
  Filled 2020-05-07 (×2): qty 50

## 2020-05-07 MED ORDER — POTASSIUM CHLORIDE IN NACL 40-0.9 MEQ/L-% IV SOLN
INTRAVENOUS | Status: AC
  Filled 2020-05-07: qty 1000

## 2020-05-07 MED ORDER — POTASSIUM CHLORIDE 10 MEQ/100ML IV SOLN
10.0000 meq | INTRAVENOUS | Status: AC
  Administered 2020-05-07 (×3): 10 meq via INTRAVENOUS
  Filled 2020-05-07 (×4): qty 100

## 2020-05-07 MED ORDER — POTASSIUM CHLORIDE 10 MEQ/100ML IV SOLN
10.0000 meq | INTRAVENOUS | Status: AC
  Administered 2020-05-07 (×4): 10 meq via INTRAVENOUS
  Filled 2020-05-07 (×3): qty 100

## 2020-05-07 MED ORDER — POTASSIUM CHLORIDE 10 MEQ/100ML IV SOLN
10.0000 meq | Freq: Once | INTRAVENOUS | Status: AC
  Administered 2020-05-07: 10 meq via INTRAVENOUS
  Filled 2020-05-07: qty 100

## 2020-05-07 MED ORDER — POTASSIUM CHLORIDE CRYS ER 20 MEQ PO TBCR: 40.0000 meq | EXTENDED_RELEASE_TABLET | ORAL | Status: DC

## 2020-05-07 MED ORDER — POTASSIUM CHLORIDE CRYS ER 20 MEQ PO TBCR
40.0000 meq | EXTENDED_RELEASE_TABLET | ORAL | Status: DC
  Filled 2020-05-07 (×2): qty 2

## 2020-05-07 NOTE — Progress Notes (Signed)
Physical Therapy Treatment Patient Details Name: Amanda [name] MRN: [id number] DOB: Sep 30, 1941 Today's Date: 05/07/2020    History of Present Illness Amanda [name] is a 79 y.o. female with medical history significant of polycythemia; CAD s/p stents; HTN; HLD; DM; obesity; and cirrhosis presenting with n/v/d.  She reports that she couldn't breathe and hasn't felt well.  Like the "whole world was caving in on me."  This has been going on for a couple of days.  Her husband reports that she came home from SNF and wasn't doing well then - in bed, still couldn't walk right, was eating and drinking ok.    PT Comments    Continuing work on functional mobility and activity tolerance;  Session focused on assessing Amanda [name]'s tolerance of using the Maximove for safe transfers OOB; Overall, she tolerated the transfer well -- might have even enjoyed it, and reported she felt "fantastic" while in recliner; She still requires extensive assist for mobility and ADLs   Follow Up Recommendations  SNF  While she is at a functional level that indicates SNF level of care, I noted the plan may be for home with Hospice Services; would recommend maximizing [facility] bed;Wheelchair cushion (measurements PT) Optician, dispensing)    Recommendations for [employer]       Precautions / Restrictions Precautions Precautions: Fall    Mobility  Bed Mobility Overal bed mobility: Needs Assistance Bed Mobility: Rolling Rolling: Max assist         General bed mobility comments: Bed mobility limited to rolling for hygeine and placement of Maximove pad  Transfers Overall transfer level: Needs assistance               General transfer comment: Dependent Maximove transfer OOB to recliner  Ambulation/Gait                 Stairs             Wheelchair Mobility    Modified Rankin (Stroke Patients Only)       Balance                                             Cognition Arousal/Alertness: Awake/alert Behavior During Therapy: WFL for tasks assessed/performed Overall Cognitive Status: Impaired/Different from baseline Area of Impairment: Orientation;Attention;Memory;Following commands;Safety/judgement;Awareness;Problem solving                   Current Attention Level: Sustained Memory: Decreased short-term memory Following Commands: Follows one step commands inconsistently;Follows one step commands with increased time Safety/Judgement: Decreased awareness of safety;Decreased awareness of deficits Awareness: Intellectual Problem Solving: Slow processing;Decreased initiation;Difficulty sequencing;Requires verbal cues;Requires tactile cues        Exercises      General Comments General comments (skin integrity, edema, etc.): Pt had soiled the bed prior to PT's arrival; Assisted pt in cleaning, hygeine; applied barrier cream, and replaced sacral dressing      Pertinent Vitals/Pain Pain Assessment: Faces Faces Pain Scale: Hurts a little bit Pain Location: buttocks area when wiping/cleaning skin Pain Descriptors / Indicators: Tender Pain Intervention(s): Repositioned    Home Living                      Prior Function            PT Goals (current  goals can now be found in the care plan section) Acute Rehab PT Goals Patient Stated Goal: Did not state PT Goal Formulation: Patient unable to participate in goal setting Time For Goal Achievement: 05/19/20 Potential to Achieve Goals: Fair Progress towards PT goals: Progressing toward goals (slowly)    Frequency    Min 2X/week      PT Plan Current plan remains appropriate    Co-evaluation              AM-PAC PT "6 Clicks" Mobility   Outcome Measure  Help needed turning from your back to your side while in a flat bed without using bedrails?: A Lot Help needed moving from lying on your back to sitting on the side of a flat bed  without using bedrails?: A Lot Help needed moving to and from a bed to a chair (including a wheelchair)?: Total Help needed standing up from a chair using your arms (e.g., wheelchair or bedside chair)?: Total Help needed to walk in hospital room?: Total Help needed climbing 3-5 steps with a railing? : Total 6 Click Score: 8    End of Session   Activity Tolerance: Patient tolerated treatment well Patient left: in chair;with call bell/phone within reach;with chair alarm set Nurse Communication: Mobility status;Need for lift equipment PT Visit Diagnosis: Other abnormalities of gait and mobility (R26.89);Difficulty in walking, not elsewhere classified (R26.2)     Time: 1035-1100 PT Time Calculation (min) (ACUTE ONLY): 25 min  Charges:  $Therapeutic Activity: 23-37 mins                     Roney Marion, PT  Acute Rehabilitation Services Pager [phone] Office [place] 05/07/2020, 4:02 PM

## 2020-05-07 NOTE — Progress Notes (Signed)
Patient ID: JHANIA ETHERINGTON, female   DOB: [birth year], 79 y.o.   MRN: [id number]  This NP visited patient at the bedside as a follow up for palliative medicine needs and emotional support.  Husband is at bedside.  Patient is alert and oriented, she continues to have continued physical and functional decline.  Her oral intake is declining.  Continued conversation regarding current medical situation, diagnosis, prognosis, goals of care, end-of-life wishes, disposition and options.  Husband expresses great distress regarding the limited transition of care options.   At this time secondary to financial stressors he is making the decision for her to discharge home when she is medically stable.  I shared with them the overall long-term poor prognosis.  We discussed the inability to physically get her to doctors appointments from the community.  We discussed the increasing care needs and her husband's inability to care for her safely.  Education offered on human mortality and the concept of adult failure to thrive.  Discussion regarding the limitations of medical interventions to prolong quality of life when the body fails to thrive.  I recommended that they consider hospice services on discharge.  Both patient and husband are open to hospice.  The patient tells me "I just want to be at home and be comfortable".  Husband is still interested in nephrology input, discussed with attending and he will consult nephrology.  PMT will follow up in the morning  Discussed with patient/has been the importance of continued conversation with the medical providers regarding overall plan of care and treatment options,  ensuring decisions are within the context of the patients values and GOCs.  Questions and concerns addressed   Discussed with Dr Doristine Bosworth and Dr [name] a secure chat, and Vanessa/LCSW  Total time spent on the unit was 35 minutes  Greater than 50% of the time was spent in counseling and coordination  of care  Wadie Lessen NP  Palliative Medicine Team Team Phone # [phone] Pager [phone]

## 2020-05-07 NOTE — Progress Notes (Signed)
PROGRESS NOTE    Amanda [name]  CBS:[id number] DOB: [birth year] DOA: 05/04/2020 PCP: [name], NP   Brief Narrative: 79 year old with past medical history significant for polycythemia, CAD s/p stents, hypertension, HLD, diabetes, obesity, cirrhosis who presented with nausea dry heaving and diarrhea.  Was complaining of shortness of breath, not feeling well.  Poor appetite.  Patient was recently released from a skilled nursing facility, as she was not doing well, when she was relieved from a skilled nursing facility.  Patient admitted with failure to thrive, ascites, she underwent paracentesis on 7/18 yielding 3 L of fluid.  Patient presented with worsening leukocytosis probably related to active myeloproliferative disorder.  Dr. [name] consulted and following.  He is recommending to continue to monitor white blood cell trends plan to hold off on any treatment for polycythemia at this point.  Patient also develop acute on chronic stage III kidney disease, post paracentesis.  She also developed metabolic acidosis.  She was started on bicarb drip.  IV fluids changed to normal saline today.  Continue to monitor renal function. Patient today is complaining of nausea and vomiting.  Compazine added as needed.    Assessment & Plan:   Principal Problem:   Failure to thrive in adult Active Problems:   Hyperlipidemia   [name] (HCC)   Essential hypertension   Leukocytosis   Obesity (BMI 30-39.9)   Hepatic cirrhosis (HCC)   Chronic diarrhea   Type 2 diabetes mellitus with diabetic polyneuropathy (HCC)   Stage 3b chronic kidney disease   DNR (do not resuscitate)  1-Failure to Thrive: -Probably related to underlying chronic medical disease. -Palliative care consulted. -Holding Remeron and Celexa given prolonged QT. -Improvement of nausea after paracentesis. SLP on board.  No aspiration risk.  Continue regular diet with thin liquid.  On Ensure 3 times daily.  2-Cirrhosis with  ascites Meld score 27/29 Underwent paracentesis on 05/04/2020 with yield of 3.3 L, no evidence of SBP on ascites fluid GI Consulted. Recommended support care.  Continue with rifaximin.  Discontinue Rocephin per GI recommendation. Holding diuretic due to AKI.  3-Leukocytosis, polycythemia vera area: Dr. [name] consulted.  Recommendation to monitor white blood cell counts trends.  Hold off on any treatment for polycythemia. Dr. [name] agree with palliative care and possible hospice care. WBC 45>55>40>27> 22.  4-Chronic diarrhea: Patient reports 1 bowel movement daily. Patient with no significant diarrhea during hospitalization.   5-Dysphagia Speech consulted On regular diet.  6-CAD status post stent: Asymptomatic.  Continue with Plavix  7-Hypertension: Blood pressure controlled.  Continue to hold Cozaar and Norvasc.  8-AKI on CKD stage IIIb; Metabolic acidosis;  Cr baseline 1.5--1.2 Renal US; no hydronephrosis.  Started IV bicarb low rate.  Metabolic acidosis improved.  Fluids were transitioned to normal saline with potassium chloride.  Her creatinine is still remains elevated at about 2.35, way above her baseline but it has stabilized.  9-nausea and vomiting: Resolved.  Continue Compazine as needed.  10-hypokalemia: She received some replacement yesterday.  I ordered oral replacement this morning but patient spitted up everything.  Switched to IV replacement.  2.5 today.   Estimated body mass index is 33.11 kg/m as calculated from the following:   Height as of this encounter: 5' 4"  (1.626 m).   Weight as of this encounter: 87.5 kg.   DVT prophylaxis: Lovenox Code Status: DNR Family Communication: No family present at bedside.  Plan of care discussed with patient herself.  Palliative care on board for family communication Status is:  Inpatient  Remains inpatient appropriate because:Persistent severe electrolyte disturbances and Ongoing active pain requiring inpatient pain  management   Dispo: The patient is from: Home              Anticipated d/c is to: home hospice              Anticipated d/c date is:1- 2 days              Patient currently is not medically stable to d/c.  Due to severe hypokalemia.    Consultants:   GI  Palliative  Procedures:     Antimicrobials:    Subjective: Patient seen and examined.  She was alert and oriented.  She had no complaint.  Objective: Vitals:   05/06/20 1643 05/06/20 2053 05/07/20 0424 05/07/20 0908  BP: (!) 145/67 135/61 (!) 141/69 118/63  Pulse: 81 71 72 75  Resp: 18 16 15 18   Temp: 98.2 F (36.8 C) 97.6 F (36.4 C) (!) 97.4 F (36.3 C) (!) 97.5 F (36.4 C)  TempSrc: Oral Oral Oral Oral  SpO2: 95% 98% 94% 96%  Weight:  87.5 kg    Height:        Intake/Output Summary (Last 24 hours) at 05/07/2020 1419 Last data filed at 05/07/2020 0900 Gross per 24 hour  Intake 2279.73 ml  Output 0 ml  Net 2279.73 ml   Filed Weights   05/04/20 0938 05/05/20 2200 05/06/20 2053  Weight: 90.7 kg 86.2 kg 87.5 kg    Examination:  General exam: Appears calm and comfortable  Respiratory system: Clear to auscultation. Respiratory effort normal. Cardiovascular system: S1 & S2 heard, RRR. No JVD, murmurs, rubs, gallops or clicks. No pedal edema. Gastrointestinal system: Abdomen is nondistended, soft and nontender. No organomegaly or masses felt. Normal bowel sounds heard. Central nervous system: Alert and oriented. No focal neurological deficits. Extremities: Symmetric 5 x 5 power. Skin: No rashes, lesions or ulcers.   Data Reviewed: I have personally reviewed following labs and imaging studies  CBC: Recent Labs  Lab 05/04/20 0219 05/04/20 0852 05/05/20 0510 05/06/20 0854 05/07/20 0309  WBC 45.8* 55.0* 40.4* 27.4* 22.0*  NEUTROABS  --  52.8*  --  24.0* 19.1*  HGB 13.8 13.8 12.0 10.6* 10.6*  HCT 44.7 45.2 41.2 33.8* 33.2*  MCV 91.2 92.1 99.0 90.6 91.0  PLT 566* 542* 250 214 845   Basic Metabolic  Panel: Recent Labs  Lab 05/04/20 0219 05/05/20 0510 05/06/20 0854 05/06/20 1604 05/07/20 0309 05/07/20 0531  NA 134* 133* 134* 132* 132*  --   K 3.0* 3.6 2.2* 2.9* 2.5*  --   CL 102 105 100 99 100  --   CO2 17* 14* 23 21* 22  --   GLUCOSE 158* 127* 134* 140* 121*  --   BUN 17 21 19 19 18   --   CREATININE 1.43* 2.02* 2.18* 2.25* 2.35*  --   CALCIUM 8.6* 8.0* 7.8* 7.8* 7.7*  --   MG  --   --   --   --   --  1.4*   GFR: Estimated Creatinine Clearance: 20.4 mL/min (A) (by C-G formula based on SCr of 2.35 mg/dL (H)). Liver Function Tests: Recent Labs  Lab 05/04/20 0219 05/05/20 1548 05/06/20 0854 05/07/20 0309  AST 24 28 19 17   ALT 7 7 7 6   ALKPHOS 160* 138* 109 106  BILITOT 2.6* 1.8* 1.3* 1.3*  PROT 6.3* 5.1* 4.4* 4.4*  ALBUMIN 2.8* 2.4* 2.1* 2.0*   Recent  Labs  Lab 05/04/20 0219  LIPASE 27   Recent Labs  Lab 05/04/20 0852  AMMONIA 64*   Coagulation Profile: Recent Labs  Lab 05/04/20 1932  INR 1.6*   Cardiac Enzymes: No results for input(s): CKTOTAL, CKMB, CKMBINDEX, TROPONINI in the last 168 hours. BNP (last 3 results) No results for input(s): PROBNP in the last 8760 hours. HbA1C: No results for input(s): HGBA1C in the last 72 hours. CBG: Recent Labs  Lab 05/06/20 1120 05/06/20 1644 05/06/20 2056 05/07/20 0638 05/07/20 1112  GLUCAP 130* 126* 137* 112* 116*   Lipid Profile: No results for input(s): CHOL, HDL, LDLCALC, TRIG, CHOLHDL, LDLDIRECT in the last 72 hours. Thyroid Function Tests: No results for input(s): TSH, T4TOTAL, FREET4, T3FREE, THYROIDAB in the last 72 hours. Anemia Panel: No results for input(s): VITAMINB12, FOLATE, FERRITIN, TIBC, IRON, RETICCTPCT in the last 72 hours. Sepsis Labs: No results for input(s): PROCALCITON, LATICACIDVEN in the last 168 hours.  Recent Results (from the past 240 hour(s))  SARS Coronavirus 2 by RT PCR (hospital order, performed in [facility] hospital lab) Nasopharyngeal Nasopharyngeal Swab     Status:  None   Collection Time: 05/04/20 12:29 PM   Specimen: Nasopharyngeal Swab  Result Value Ref Range Status   SARS Coronavirus 2 NEGATIVE NEGATIVE Final    Comment: (NOTE) SARS-CoV-2 target nucleic acids are NOT DETECTED.  The SARS-CoV-2 RNA is generally detectable in upper and lower respiratory specimens during the acute phase of infection. The lowest concentration of SARS-CoV-2 viral copies this assay can detect is 250 copies / mL. A negative result does not preclude SARS-CoV-2 infection and should not be used as the sole basis for treatment or other patient management decisions.  A negative result may occur with improper specimen collection / handling, submission of specimen other than nasopharyngeal swab, presence of viral mutation(s) within the areas targeted by this assay, and inadequate number of viral copies (<250 copies / mL). A negative result must be combined with clinical observations, patient history, and epidemiological information.  Fact Sheet for Patients:   StrictlyIdeas.no  Fact Sheet for Healthcare Providers: BankingDealers.co.za  This test is not yet approved or  cleared by the [place] FDA and has been authorized for detection and/or diagnosis of SARS-CoV-2 by FDA under an Emergency Use Authorization (EUA).  This EUA will remain in effect (meaning this test can be used) for the duration of the COVID-19 declaration under Section 564(b)(1) of the Act, 21 U.S.C. section 360bbb-3(b)(1), unless the authorization is terminated or revoked sooner.  Performed at [facility], [place] [address], [place], [place] [address]   Gram stain     Status: None   Collection Time: 05/04/20  2:39 PM   Specimen: PATH Cytology Peritoneal fluid  Result Value Ref Range Status   Specimen Description PERITONEAL  Final   Special Requests NONE  Final   Gram Stain   Final    CYTOSPIN SMEAR WBC PRESENT,BOTH PMN AND MONONUCLEAR NO  ORGANISMS SEEN Performed at [facility], [place] [address], [place], [place] [address]    Report Status 05/04/2020 FINAL  Final  Culture, body fluid-bottle     Status: None (Preliminary result)   Collection Time: 05/04/20  2:39 PM   Specimen: Peritoneal Washings  Result Value Ref Range Status   Specimen Description PERITONEAL  Final   Special Requests NONE  Final   Culture   Final    NO GROWTH 3 DAYS Performed at [place]  [address], [place], [place] [address]    Report Status PENDING  Incomplete         Radiology Studies: No results found.      Scheduled Meds: . Chlorhexidine Gluconate Cloth  6 each Topical Q0600  . clopidogrel  75 mg Oral Daily  . enoxaparin (LOVENOX) injection  30 mg Subcutaneous Q24H  . ezetimibe  10 mg Oral Daily  . feeding supplement (ENSURE ENLIVE)  237 mL Oral TID BM  . Gerhardt's butt cream   Topical BID  . insulin aspart  0-9 Units Subcutaneous TID WC  . latanoprost  1 drop Both Eyes QHS  . multivitamin with minerals  1 tablet Oral Daily  . mupirocin ointment  1 application Nasal BID  . pantoprazole  40 mg Oral Daily  . rifaximin  550 mg Oral BID  . sodium chloride flush  3 mL Intravenous Q12H  . timolol  1 drop Both Eyes Daily   Continuous Infusions: . 0.9 % NaCl with KCl 40 mEq / L 50 mL/hr at 05/07/20 0601  . magnesium sulfate bolus IVPB    . potassium chloride    . potassium chloride 10 mEq (05/07/20 1229)     LOS: 3 days    Time spent: 34 minutes  Darliss Cheney, MD [facility]  If 7PM-7AM, please contact night-coverage [email or web address]  05/07/2020, 2:19 PM

## 2020-05-07 NOTE — Progress Notes (Signed)
Patient is refusing all PO medications. MD notified and made aware.

## 2020-05-07 NOTE — TOC Progression Note (Addendum)
Transition of Care [facility]) - Progression Note    Patient Details  Name: DIAVIAN FURGASON MRN: [id number] Date of Birth: 12-May-1941  Transition of Care [facility]) CM/SW Contact  Sharlet Salina Mila Homer, LCSW Phone Number: 05/07/2020, 5:45 PM  Clinical Narrative:  Talked with patient's husband this morning regarding SNF placement. Mr. Ripley advised CSW that he had been to DSS and applied for Medicaid for his wife, for LTC placement. He wants his wife to return to [facility], however per a phone call I had with admissions director Irine Seal (10:34 am) before talking with spouse, patient is in her co-pay days ($184.00 per day) and they would have to pay 10 days of cop-pays up front. Mr. Vangorden advised that he cannot pay this and CSW advised him of the other SNF's that had made bed offers. Mr. Stegeman called [facility] while with CSW to talk with them about taking patient and the co-pays. CSW advised spouse that another call would be placed to [facility] to get clarification on 60 day wellness period. Call made to [facility], admissions director at [facility] and was informed that the 60 days wellness period means being out of a SNF or the hospital. Per spouse, his wife discharged from [facility] on 5/26 and was admitted to hospital on 7/18 and therefore has not had a 60 day wellness period.   CSW talked with patient and clarified the wellness period and that his wife has not had a 60 days wellness period as she returned to the hospital on 7/18. Mr. Contino expressed discouragement and indicated that he would need a hoyer lift. When asked, Mr. Rybarczyk reported that his wife has a wheelchair that he purchased, a walker, a shower bench and a bedside commode. Talked with Carles Collet, nurse case manager regarding [facility] services and DME for patient. Patient will discharge home once medically stable and all HH/DME services are arranged.        Expected Discharge Plan: Skilled Nursing Facility Barriers to Discharge: No Barriers Identified  Expected  Discharge Plan and Services Expected Discharge Plan: [place] arrangements for the past 2 months: Apartment                 DME Arranged:  (hoyer lift) DME Agency: [employer] Date DME Agency Contacted: 05/07/20 Time DME Agency Contacted: [id number] Representative spoke with at DME Agency: zach HH Arranged: RN, PT, OT, Nurse's Aide, Social Work [employer] Agency: [place] Date [place]: 05/07/20 Time [place]: 1154 Representative spoke with at [place]: [place] ([place]) Interventions    Readmission Risk Interventions Readmission Risk Prevention Plan 03/31/2020 01/17/2020  Transportation Screening Complete Complete  PCP or Specialist Appt within 3-5 Days - Not Complete  Not Complete comments - plan for SNF  HRI or [place] - Complete  Social Work Consult for [place] Planning/Counseling - Complete  Palliative Care Screening - Complete  Medication Review Press photographer) Referral to Pharmacy Referral to Pharmacy  PCP or Specialist appointment within 3-5 days of discharge Complete -  [place] or Home Care Consult Complete -  SW Recovery Care/Counseling Consult Complete -  Palliative Care Screening Not Applicable -  [place] Complete -  Some recent data might be hidden

## 2020-05-07 NOTE — TOC Transition Note (Addendum)
Transition of Care [facility]) - CM/SW Discharge Note   Patient Details  Name: Amanda [name] MRN: [id number] Date of Birth: 1941-10-15  Transition of Care [facility]) CM/SW Contact:  Carles Collet, RN Phone Number: 05/07/2020, 11:54 AM   Clinical Narrative:    Notified by CSW that patient will need [place] set up through [facility] and hoyer lift home. Order for hoyer and referral placed. [employer] will contact son to set up delivery time at home. Referral for [facility] placed, will need [place] orders for [facility] PT OT HHA SW.  Patient and spouse now interested in home hospice care. Per palliative referral, they are interested in [facility]. Notified Webb Silversmith w [facility] of referral. Notified Thedore Mins of [employer] to cancel request for hoyer as equipment will be handled by [facility].        Final next level of care: Home w Home Health Services Barriers to Discharge: No Barriers Identified   Patient Goals and CMS Choice Patient states their goals for this hospitalization and ongoing recovery are:: Husband understands and agrees that his wife needs rehab. [name] also indicated that he can't care of patient at home CMS Medicare.gov Compare Post Acute Care list provided to:: Other (Comment Required) (Spouse provided CSW with facility preference initially.) Choice offered to / list presented to : Spouse (Patient provided facility preference. CSW will discuss other SNF options and Medicare.gov if needed)  Discharge Placement                       Discharge Plan and Services                DME Arranged:  (hoyer lift) DME Agency: [employer] Date DME Agency Contacted: 05/07/20 Time DME Agency Contacted: [id number] Representative spoke with at DME Agency: zach HH Arranged: RN, PT, OT, Nurse's Aide, Social Work [employer] Agency: [place] Date [place]: 05/07/20 Time [place]: [place] Representative spoke with at [place]: [place] ([place]) Interventions     Readmission Risk  Interventions Readmission Risk Prevention Plan 03/31/2020 01/17/2020  Transportation Screening Complete Complete  PCP or Specialist Appt within 3-5 Days - Not Complete  Not Complete comments - plan for SNF  HRI or [place] - Complete  Social Work Consult for [place] Planning/Counseling - Complete  Palliative Care Screening - Complete  Medication Review Press photographer) Referral to Pharmacy Referral to Pharmacy  PCP or Specialist appointment within 3-5 days of discharge Complete -  [place] or Home Care Consult Complete -  SW Recovery Care/Counseling Consult Complete -  Palliative Care Screening Not Applicable -  [place] Complete -  Some recent data might be hidden

## 2020-05-07 NOTE — Progress Notes (Signed)
  Speech Language Pathology Treatment: Dysphagia  Patient Details Name: Amanda [name] MRN: [id number] DOB: 1941/01/24 Today's Date: 05/07/2020 Time: 0850-0901 SLP Time Calculation (min) (ACUTE ONLY): 11 min  Assessment / Plan / Recommendation Clinical Impression  Pt seen with am meal. Pt attentive, able to follow commands and participate in self feeding with set up assist. SHe needed cues for basic orientation to place and situation. She is able to make appropriate requests to direct care, but is otherwise reluctant to participate in cognitive tasks. Pt consumed 8 oz of Orange juice without difficulty. She reported she did not want to eat breakfast, and typically does not eat breakfast, however was agreeable to trying some bacon, which she was unable to masticate due to chewy texture and lack of dentition. She does not want her food to be mechanically softened and given abilities would be capable of most foods offered if she has an appetite. At this time, there is no further intervention expected to benefit pt in acute setting. Pt to f/u with SLP at SNF level for cognition as appropriate.   HPI HPI: Amanda [name] is a 79 y.o. female with medical history significant of polycythemia; CAD s/p stents; HTN; HLD; DM; obesity; and cirrhosis presenting with n/v/d. CXR on 7/18 reported "Poor inspiration with mild bibasilar atelectasis."  Pt has a hx of cognitive deficits evidenced via SLE on 03/23/18 which reported deficits in reasoning, working and short term memory and emergent awareness of errors.      SLP Plan  Continue with current plan of care       Recommendations  Diet recommendations: Regular;Thin liquid Liquids provided via: Cup;Straw Medication Administration: Whole meds with liquid Supervision: Staff to assist with self feeding Compensations: Minimize environmental distractions;Small sips/bites Postural Changes and/or Swallow Maneuvers: Seated upright 90 degrees                Oral  Care Recommendations: Oral care BID Follow up Recommendations: Skilled Nursing facility SLP Visit Diagnosis: Dysphagia, unspecified (R13.10) Plan: Continue with current plan of care       GO                Kadden Osterhout, Katherene Ponto 05/07/2020, 9:26 AM

## 2020-05-08 ENCOUNTER — Inpatient Hospital Stay (HOSPITAL_COMMUNITY): Payer: Medicare HMO

## 2020-05-08 ENCOUNTER — Telehealth: Payer: Self-pay | Admitting: Adult Health

## 2020-05-08 ENCOUNTER — Other Ambulatory Visit: Payer: Self-pay | Admitting: Adult Health

## 2020-05-08 DIAGNOSIS — K767 Hepatorenal syndrome: Secondary | ICD-10-CM

## 2020-05-08 HISTORY — PX: IR PARACENTESIS: IMG2679

## 2020-05-08 LAB — CBC WITH DIFFERENTIAL/PLATELET
Abs Immature Granulocytes: 0 10*3/uL (ref 0.00–0.07)
Basophils Absolute: 0 10*3/uL (ref 0.0–0.1)
Basophils Relative: 0 %
Eosinophils Absolute: 0.7 10*3/uL — ABNORMAL HIGH (ref 0.0–0.5)
Eosinophils Relative: 2 %
HCT: 38.2 % (ref 36.0–46.0)
Hemoglobin: 11.9 g/dL — ABNORMAL LOW (ref 12.0–15.0)
Lymphocytes Relative: 2 %
Lymphs Abs: 0.7 10*3/uL (ref 0.7–4.0)
MCH: 29 pg (ref 26.0–34.0)
MCHC: 31.2 g/dL (ref 30.0–36.0)
MCV: 92.9 fL (ref 80.0–100.0)
Monocytes Absolute: 0.7 10*3/uL (ref 0.1–1.0)
Monocytes Relative: 2 %
Neutro Abs: 30.6 10*3/uL — ABNORMAL HIGH (ref 1.7–7.7)
Neutrophils Relative %: 94 %
Platelets: 279 10*3/uL (ref 150–400)
RBC: 4.11 MIL/uL (ref 3.87–5.11)
RDW: 21.2 % — ABNORMAL HIGH (ref 11.5–15.5)
WBC: 32.6 10*3/uL — ABNORMAL HIGH (ref 4.0–10.5)
nRBC: 0.1 % (ref 0.0–0.2)

## 2020-05-08 LAB — COMPREHENSIVE METABOLIC PANEL
ALT: 9 U/L (ref 0–44)
AST: 25 U/L (ref 15–41)
Albumin: 2.4 g/dL — ABNORMAL LOW (ref 3.5–5.0)
Alkaline Phosphatase: 144 U/L — ABNORMAL HIGH (ref 38–126)
Anion gap: 9 (ref 5–15)
BUN: 19 mg/dL (ref 8–23)
CO2: 20 mmol/L — ABNORMAL LOW (ref 22–32)
Calcium: 8.1 mg/dL — ABNORMAL LOW (ref 8.9–10.3)
Chloride: 102 mmol/L (ref 98–111)
Creatinine, Ser: 2.39 mg/dL — ABNORMAL HIGH (ref 0.44–1.00)
GFR calc Af Amer: 21 mL/min — ABNORMAL LOW (ref >60)
GFR calc non Af Amer: 19 mL/min — ABNORMAL LOW (ref >60)
Glucose, Bld: 129 mg/dL — ABNORMAL HIGH (ref 70–99)
Potassium: 4.3 mmol/L (ref 3.5–5.1)
Sodium: 131 mmol/L — ABNORMAL LOW (ref 135–145)
Total Bilirubin: 1.4 mg/dL — ABNORMAL HIGH (ref 0.3–1.2)
Total Protein: 5 g/dL — ABNORMAL LOW (ref 6.5–8.1)

## 2020-05-08 LAB — GLUCOSE, CAPILLARY
Glucose-Capillary: 114 mg/dL — ABNORMAL HIGH (ref 70–99)
Glucose-Capillary: 139 mg/dL — ABNORMAL HIGH (ref 70–99)

## 2020-05-08 LAB — LACTATE DEHYDROGENASE: LDH: 196 U/L — ABNORMAL HIGH (ref 98–192)

## 2020-05-08 MED ORDER — LIDOCAINE HCL (PF) 1 % IJ SOLN
INTRAMUSCULAR | Status: DC | PRN
  Administered 2020-05-08: 10 mL

## 2020-05-08 MED ORDER — LIDOCAINE HCL 1 % IJ SOLN
INTRAMUSCULAR | Status: AC
  Filled 2020-05-08: qty 20

## 2020-05-08 MED ORDER — HEPARIN SOD (PORK) LOCK FLUSH 100 UNIT/ML IV SOLN
500.0000 [IU] | INTRAVENOUS | Status: DC | PRN
  Administered 2020-05-08: 500 [IU]
  Filled 2020-05-08 (×2): qty 5

## 2020-05-08 MED ORDER — ZINC OXIDE 40 % EX OINT: 1.0000 "application " | TOPICAL_OINTMENT | CUTANEOUS | 0 refills | Status: AC | PRN

## 2020-05-08 MED ORDER — HEPARIN SOD (PORK) LOCK FLUSH 100 UNIT/ML IV SOLN
500.0000 [IU] | INTRAVENOUS | Status: DC
  Filled 2020-05-08: qty 5

## 2020-05-08 NOTE — Discharge Summary (Signed)
Physician Discharge Summary  LORIE CLECKLEY GNF:[id number] DOB: September 01, 1941 DOA: 05/04/2020  PCP: [name], NP  Admit date: 05/04/2020 Discharge date: 05/08/2020  Admitted From: Home Disposition: Home with hospice  Recommendations for Outpatient Follow-up:  1. Follow up with PCP in 1-2 weeks 2. Please obtain BMP/CBC in one week 3. Please follow up with your PCP on the following pending results: Unresulted Labs (From admission, onward) Comment          Start     Ordered   05/07/20 0500  Lactate dehydrogenase  Daily,   R     Question:  Specimen collection method  Answer:  IV Team=IV Team collect   05/06/20 0725   05/06/20 0726  CBC with Differential/Platelet  Daily,   R     Question:  Specimen collection method  Answer:  IV Team=IV Team collect   05/06/20 0725   05/06/20 0726  Comprehensive metabolic panel  Daily,   R     Question:  Specimen collection method  Answer:  IV Team=IV Team collect   05/06/20 0725   05/05/20 1642  Sodium, urine, random  Once,   R        05/05/20 1641   05/04/20 1844  Pathologist smear review  Once,   R        05/04/20 1901           Home Health: None Equipment/Devices: Harrel Lemon lift  Discharge Condition: Guarded CODE STATUS: DNR Diet recommendation: Low-sodium diet  Subjective: Seen and examined.  Husband at the bedside.  She has no complaints.  She is ready to go home.  Husband also is ready to take her home with hospice.  Brief/Interim Summary: 79 year old with past medical history significant for polycythemia, CAD s/p stents, hypertension, HLD, diabetes, obesity, cirrhosis who presented with nausea dry heaving and diarrhea.  Was complaining of shortness of breath, not feeling well.  Poor appetite.  Patient was recently released from a skilled nursing facility, as she was not doing well, when she was relieved from a skilled nursing facility.  Patient admitted with failure to thrive, ascites, she underwent paracentesis on 7/18 yielding 3 L of  fluid.  Patient presented with worsening leukocytosis probably related to active myeloproliferative disorder.  Dr. Ennever/oncology consulted and He recommended to continue to monitor white blood cell trends plan to hold off on any treatment for polycythemia at this point.  Patient also developed acute on chronic stage III kidney disease, post paracentesis.  She also developed metabolic acidosis.  She was started on bicarb drip.  Metabolic acidosis resolved.  IV fluids changed to normal saline.  Patient's renal function remained stable but elevated.  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  She is producing good amount of urine.  Since renal function has remained stabilized so no indication for consulting nephrology as this is likely her new baseline due to hepatorenal syndrome.  Patient was once again seen by oncology today and repeat paracentesis/therapeutic was ordered.  She has underwent paracentesis but the note is still not available for me to tell how much fluid was yielded.  Patient did not have any abdominal pain, abdominal pressure or shortness of breath when I saw her this morning before paracentesis.  Leukocytosis slightly worse but no further recommendations from oncology regarding that.  Patient was seen by palliative care.  Patient agreed to going home with hospice.  Her husband in agreement as well.  All arrangements have been made.  Hoyer lift has been delivered.  Husband ready to take her home.  She is asymptomatic and doing well but obviously she is at risk of worsening renal function due to hepatorenal syndrome.  For this reason, patient's losartan, Lasix and Aldactone has been discontinued.  Prior to coming to the hospital, patient's home medications included Lantus but there was some discrepancy in the number of units.  Reportedly, patient was supposed to stop that but her husband continued that.  Here, her blood sugar remained stable only on sliding scale swelling so Lantus is being  discontinued.  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[place])    Discharge Instructions   Allergies as of 05/08/2020      Reactions   Doxycycline Hives, Swelling, Rash   Penicillins Swelling, Other (See Comments)   Face swells Has patient had a PCN reaction causing immediate rash, facial/tongue/throat swelling, SOB or lightheadedness with hypotension: Yes Has patient had a PCN reaction causing severe rash involving mucus membranes or skin necrosis: No Has patient had a PCN reaction that required hospitalization: No Has patient had a PCN reaction occurring within the last 10 years: No If all of the above answers are "NO", then may proceed with Cephalosporin use.   Lisinopril Cough   Tape Hives   Latex Hives, Itching, Rash      Medication List    STOP taking these medications   furosemide 40 MG tablet Commonly known as: LASIX   Lantus SoloStar 100 UNIT/ML Solostar Pen Generic drug: insulin glargine   losartan 100 MG tablet Commonly known as: COZAAR   spironolactone 25 MG tablet Commonly known as: ALDACTONE     TAKE these medications   Accu-Chek Guide w/Device Kit 1 kit by Does not apply route as directed.   amLODipine 5 MG tablet Commonly known as: NORVASC Take 5 mg by mouth daily.   citalopram 20 MG tablet Commonly known as: CeleXA Take 1 tablet (20 mg total) by mouth daily.   clopidogrel 75 MG tablet Commonly known as: PLAVIX Take 75 mg by mouth daily.   diphenoxylate-atropine 2.5-0.025 MG tablet Commonly known as: Lomotil Take 1 tablet by mouth 4 (four) times daily as needed for diarrhea or loose stools.    ezetimibe 10 MG tablet Commonly known as: ZETIA Take 1 tablet (10 mg total) by mouth daily.   fluticasone 50 MCG/ACT nasal spray Commonly known as: FLONASE Place 1 spray into both nostrils daily.   FreeStyle Libre 2 Reader [place] For continuous blood glucose monitoring. E11.42   FreeStyle Libre 2 Sensor Misc For continuous blood glucose monitoring. E11.42   glucose blood test strip Commonly known as: Accu-Chek Guide Use to check blood sugars 3 times daily   OneTouch Verio test strip Generic drug: glucose blood USE TO TEST BLOOD SUGAR THREE TIMES DAILY   insulin aspart 100 UNIT/ML FlexPen Commonly known as: NOVOLOG 0-9 Units, Subcutaneous, 3 times daily with meals CBG < 70: Implement Hypoglycemia measures, Call primary MD CBG 70 - 120: 0 units CBG 121 - 150: 1 unit CBG 151 - 200: 2 units CBG 201 - 250: 3 units CBG 251 - 300: 5 units CBG 301 - 350:  7 units CBG 351 - 400: 9 units CBG > 400: call primary MD What changed:   how much to take  how to take this  when to take this   Insulin Pen Needle 32G X 4 MM Misc 1 Device by Does not apply route in the morning, at noon, in the evening, and at bedtime.   latanoprost 0.005 % ophthalmic solution Commonly known as: XALATAN Place 1 drop into both eyes at bedtime.   lidocaine-prilocaine cream Commonly known as: EMLA Apply 1 application topically as needed. Please apply EMLA over the Port-A-Cath site 1 hour prior to coming to our office. What changed:   when to take this  reasons to take this  additional instructions   loperamide 2 MG tablet Commonly known as: IMODIUM A-D Take 2 mg by mouth 4 (four) times daily as needed for diarrhea or loose stools.   meclizine 25 MG tablet Commonly known as: ANTIVERT TAKE 1 TABLET(25 MG) BY MOUTH THREE TIMES DAILY AS NEEDED FOR DIZZINESS What changed: See the new instructions.   mirtazapine 7.5 MG tablet Commonly known as: REMERON Take 7.5 mg by mouth at bedtime. What changed:  Another medication with the same name was removed. Continue taking this medication, and follow the directions you see here.   ondansetron 4 MG disintegrating tablet Commonly known as: Zofran ODT Take 1 tablet (4 mg total) by mouth every 8 (eight) hours as needed. What changed: reasons to take this   pantoprazole 40 MG tablet Commonly known as: PROTONIX Take 1 tablet (40 mg total) by mouth 2 (two) times daily before a meal.   triamcinolone cream 0.1 % Commonly known as: KENALOG Apply 1 application topically 2 (two) times daily. What changed:   when to take this  reasons to take this   Xifaxan 550 MG Tabs tablet Generic drug: rifaximin Take 1 tablet (550 mg total) by mouth 2 (two) times daily.            Durable Medical Equipment  (From admission, onward)         Start     Ordered   05/07/20 1154  For home use only DME Other see comment  Once       Comments: Harrel Lemon lift  Question:  Length of Need  Answer:  Lifetime   05/07/20 1153          Follow-up Information    [facility] Follow up.   Specialty: Home Health Services Contact information: [address] [place] [address] [phone]        [facility] Follow up.   Contact information: [place] [address] [phone]       [name], NP Follow up in 1 week(s).   Specialty: Family Medicine Contact information: [place] [address] [phone]              Allergies  Allergen Reactions  . Doxycycline Hives, Swelling and Rash  . Penicillins Swelling and Other (See Comments)    Face swells Has patient had a PCN reaction causing immediate rash, facial/tongue/throat swelling, SOB or lightheadedness with hypotension: Yes Has patient had a PCN reaction causing severe rash involving mucus membranes or skin necrosis: No Has patient had a PCN reaction that required hospitalization: No Has patient had  a PCN reaction occurring within the last 10 years: No If all of the above answers are "NO", then may proceed with Cephalosporin use.  Marland Kitchen Lisinopril Cough  .  Tape Hives  . Latex Hives, Itching and Rash    Consultations: Oncology and palliative care and IR   Procedures/Studies: CT ABDOMEN PELVIS W CONTRAST  Result Date: 05/04/2020 CLINICAL DATA:  Generalized abdominal pain, nausea, vomiting. EXAM: CT ABDOMEN AND PELVIS WITH CONTRAST TECHNIQUE: Multidetector CT imaging of the abdomen and pelvis was performed using the standard protocol following bolus administration of intravenous contrast. CONTRAST:  33m OMNIPAQUE IOHEXOL 300 MG/ML  SOLN COMPARISON:  Feb 20, 2020. FINDINGS: Lower chest: No acute abnormality. Hepatobiliary: Findings consistent with hepatic cirrhosis. Minimal cholelithiasis is noted. No biliary dilatation is noted. Stable heterogeneity of hepatic parenchyma is noted without a dominant focal lesion. Pancreas: Unremarkable. No pancreatic ductal dilatation or surrounding inflammatory changes. Spleen: Mild splenomegaly is noted. Adrenals/Urinary Tract: Adrenal glands are unremarkable. Kidneys are normal, without renal calculi, focal lesion, or hydronephrosis. Bladder is unremarkable. Stomach/Bowel: The stomach appears normal. There is no evidence of bowel obstruction or inflammation. The appendix is not visualized, but no inflammation is noted in the right lower quadrant. Vascular/Lymphatic: Aortic atherosclerosis. No enlarged abdominal or pelvic lymph nodes. Reproductive: Uterus and bilateral adnexa are unremarkable. Other: Mild ascites is noted, including around the liver and spleen. No definite hernia is noted. Musculoskeletal: No acute or significant osseous findings. IMPRESSION: 1. Findings consistent with hepatic cirrhosis with mild splenomegaly and mild ascites. 2. Minimal cholelithiasis. Aortic Atherosclerosis (ICD10-I70.0). Electronically Signed   By: JMarijo ConceptionM.D.   On:  05/04/2020 10:15   [place]NAL  Result Date: 05/05/2020 CLINICAL DATA:  Acute CT in. EXAM: RENAL / URINARY TRACT ULTRASOUND COMPLETE COMPARISON:  None. FINDINGS: Right Kidney: Renal measurements: 10.9 x 4.9 x 5.2 cm = volume: 144 mL. Increased cortical echogenicity. No mass or hydronephrosis visualized. Left Kidney: Renal measurements: 10.5 x 4.9 x 4.3 cm = volume: 111 mL. Increased cortical echogenicity. No mass or hydronephrosis visualized. Bladder: Appears normal for degree of bladder distention. Other: Incidental note of small volume ascites. IMPRESSION: 1. Increased cortical echogenicity, in keeping with medical renal disease. No hydronephrosis. 2.  Incidental note of small volume ascites. Electronically Signed   By: AEddie CandleM.D.   On: 05/05/2020 09:32   [place]acentesis  Result Date: 05/04/2020 INDICATION: Recurrent ascites NASH Cirrhosis EXAM: ULTRASOUND GUIDED LLQ PARACENTESIS MEDICATIONS: 10 cc 1% lidocaine COMPLICATIONS: None immediate. PROCEDURE: Informed written consent was obtained from the patient after a discussion of the risks, benefits and alternatives to treatment. A timeout was performed prior to the initiation of the procedure. Initial ultrasound scanning demonstrates a large amount of ascites within the left lower abdominal quadrant. The left lower abdomen was prepped and draped in the usual sterile fashion. 1% lidocaine was used for local anesthesia. Following this, a 115G Yueh catheter was introduced. An ultrasound image was saved for documentation purposes. The paracentesis was performed. The catheter was removed and a dressing was applied. The patient tolerated the procedure well without immediate post procedural complication. Patient received post-procedure intravenous albumin; see nursing notes for details. FINDINGS: A total of approximately 3.3 Liters of dark yellow fluid was removed. Samples were sent to the laboratory as requested by the clinical team. IMPRESSION: Successful  ultrasound-guided paracentesis yielding 3.3 liters of peritoneal fluid. Read by PLavonia DraftsPRichland Parish Hospital - DelhiElectronically Signed   By: JCorrie MckusickD.O.   On: 05/04/2020 14:43   DG Chest Portable 1 View  Result Date: 05/04/2020 CLINICAL DATA:  Weakness.  Nausea, vomiting and diarrhea. EXAM: PORTABLE CHEST 1 VIEW COMPARISON:  03/28/2020 FINDINGS: Right  jugular porta catheter tip in the right atrium approximately 4 cm inferior to the superior cavoatrial junction. Stable enlarged cardiac silhouette. Poor inspiration with mild bibasilar atelectasis. Diffuse osteopenia. IMPRESSION: 1. Poor inspiration with mild bibasilar atelectasis. 2. Stable cardiomegaly. Electronically Signed   By: Claudie Revering M.D.   On: 05/04/2020 12:42      Discharge Exam: Vitals:   05/08/20 1140 05/08/20 1200  BP: (!) 154/76 (!) 142/65  Pulse:    Resp:    Temp:    SpO2:     Vitals:   05/08/20 0508 05/08/20 0909 05/08/20 1140 05/08/20 1200  BP: (!) 127/48 (!) 144/63 (!) 154/76 (!) 142/65  Pulse: 85 81    Resp: 18 18    Temp: 97.9 F (36.6 C) 98.1 F (36.7 C)    TempSrc:  Oral    SpO2: 93% 100%    Weight: 92.4 kg     Height:        General: Pt is alert, awake, not in acute distress Cardiovascular: RRR, S1/S2 +, no rubs, no gallops Respiratory: CTA bilaterally, no wheezing, no rhonchi Abdominal: Soft, NT, ND, bowel sounds + Extremities: +1 pitting edema bilateral lower extremity, no cyanosis    The results of significant diagnostics from this hospitalization (including imaging, microbiology, ancillary and laboratory) are listed below for reference.     Microbiology: Recent Results (from the past 240 hour(s))  SARS Coronavirus 2 by RT PCR (hospital order, performed in [facility] hospital lab) Nasopharyngeal Nasopharyngeal Swab     Status: None   Collection Time: 05/04/20 12:29 PM   Specimen: Nasopharyngeal Swab  Result Value Ref Range Status   SARS Coronavirus 2 NEGATIVE NEGATIVE Final    Comment:  (NOTE) SARS-CoV-2 target nucleic acids are NOT DETECTED.  The SARS-CoV-2 RNA is generally detectable in upper and lower respiratory specimens during the acute phase of infection. The lowest concentration of SARS-CoV-2 viral copies this assay can detect is 250 copies / mL. A negative result does not preclude SARS-CoV-2 infection and should not be used as the sole basis for treatment or other patient management decisions.  A negative result may occur with improper specimen collection / handling, submission of specimen other than nasopharyngeal swab, presence of viral mutation(s) within the areas targeted by this assay, and inadequate number of viral copies (<250 copies / mL). A negative result must be combined with clinical observations, patient history, and epidemiological information.  Fact Sheet for Patients:   StrictlyIdeas.no  Fact Sheet for Healthcare Providers: BankingDealers.co.za  This test is not yet approved or  cleared by the [place] FDA and has been authorized for detection and/or diagnosis of SARS-CoV-2 by FDA under an Emergency Use Authorization (EUA).  This EUA will remain in effect (meaning this test can be used) for the duration of the COVID-19 declaration under Section 564(b)(1) of the Act, 21 U.S.C. section 360bbb-3(b)(1), unless the authorization is terminated or revoked sooner.  Performed at [facility], [place] [address] [place], [place] [address]   Gram stain     Status: None   Collection Time: 05/04/20  2:39 PM   Specimen: PATH Cytology Peritoneal fluid  Result Value Ref Range Status   Specimen Description PERITONEAL  Final   Special Requests NONE  Final   Gram Stain   Final    CYTOSPIN SMEAR WBC PRESENT,BOTH PMN AND MONONUCLEAR NO ORGANISMS SEEN Performed at [facility], [place] [address] [place], [place] [address]    Report Status 05/04/2020 FINAL  Final  Culture, body fluid-bottle      Status: None (Preliminary result)   Collection Time: 05/04/20  2:39 PM   Specimen: Peritoneal Washings  Result Value Ref Range Status   Specimen Description PERITONEAL  Final   Special Requests NONE  Final   Culture   Final    NO GROWTH 3 DAYS Performed at [facility], [address] [place], [place] [address]    Report Status PENDING  Incomplete     Labs: BNP (last 3 results) Recent Labs    08/10/19 1906 03/29/20 0804  BNP 62.7 412.8*   Basic Metabolic Panel: Recent Labs  Lab 05/05/20 0510 05/06/20 0854 05/06/20 1604 05/07/20 0309 05/07/20 0531 05/08/20 0457  NA 133* 134* 132* 132*  --  131*  K 3.6 2.2* 2.9* 2.5*  --  4.3  CL 105 100 99 100  --  102  CO2 14* 23 21* 22  --  20*  GLUCOSE 127* 134* 140* 121*  --  129*  BUN _0 --  19  CREATININE 2.02* 2.18* 2.25* 2.35*  --  2.39*  CALCIUM 8.0* 7.8* 7.8* 7.7*  --  8.1*  MG  --   --   --   --  1.4*  --    Liver Function Tests: Recent Labs  Lab 05/04/20 0219 05/05/20 1548 05/06/20 0854 05/07/20 0309 05/08/20 0457  AST _1 ALT _2 ALKPHOS 160* 138* 109 106 144*  BILITOT 2.6* 1.8* 1.3* 1.3* 1.4*  PROT 6.3* 5.1* 4.4* 4.4* 5.0*  ALBUMIN 2.8* 2.4* 2.1* 2.0* 2.4*   Recent Labs  Lab 05/04/20 0219  LIPASE 27   Recent Labs  Lab 05/04/20 0852  AMMONIA 64*   CBC: Recent Labs  Lab 05/04/20 0852 05/05/20 0510 05/06/20 0854 05/07/20 0309 05/08/20 0457  WBC 55.0* 40.4* 27.4* 22.0* 32.6*  NEUTROABS 52.8*  --  24.0* 19.1* 30.6*  HGB 13.8 12.0 10.6* 10.6* 11.9*  HCT 45.2 41.2 33.8* 33.2* 38.2  MCV 92.1 99.0 90.6 91.0 92.9  PLT 542* 250 214 174 279   Cardiac Enzymes: No results for input(s): CKTOTAL, CKMB, CKMBINDEX, TROPONINI in the last 168 hours. BNP: Invalid input(s): POCBNP CBG: Recent Labs  Lab 05/07/20 1112 05/07/20 1625 05/07/20 2035 05/08/20 0643 05/08/20 1250  GLUCAP 116* 131* 156* 114* 139*   D-Dimer No results for input(s): DDIMER in the last 72  hours. Hgb A1c No results for input(s): HGBA1C in the last 72 hours. Lipid Profile No results for input(s): CHOL, HDL, LDLCALC, TRIG, CHOLHDL, LDLDIRECT in the last 72 hours. Thyroid function studies No results for input(s): TSH, T4TOTAL, T3FREE, THYROIDAB in the last 72 hours.  Invalid input(s): FREET3 Anemia work up No results for input(s): VITAMINB12, FOLATE, FERRITIN, TIBC, IRON, RETICCTPCT in the last 72 hours. Urinalysis    Component Value Date/Time   COLORURINE AMBER (A) 05/04/2020 1106   APPEARANCEUR HAZY (A) 05/04/2020 1106   LABSPEC >1.046 (H) 05/04/2020 1106   PHURINE 5.0 05/04/2020 1106   GLUCOSEU NEGATIVE 05/04/2020 1106   GLUCOSEU NEGATIVE 01/10/2018 1217   HGBUR NEGATIVE 05/04/2020 1106   HGBUR large 05/07/2010 0845   BILIRUBINUR NEGATIVE 05/04/2020 1106   BILIRUBINUR neg 01/26/2016 1003   KETONESUR NEGATIVE 05/04/2020 1106   PROTEINUR NEGATIVE 05/04/2020 1106   UROBILINOGEN 0.2 01/10/2018 1217   NITRITE NEGATIVE 05/04/2020 1106   LEUKOCYTESUR SMALL (A) 05/04/2020 1106   Sepsis Labs Invalid input(s): PROCALCITONIN,  WBC,  [place] Microbiology Recent Results (from the past 240 hour(s))  SARS Coronavirus 2 by RT PCR (hospital order, performed in [facility] hospital lab) Nasopharyngeal Nasopharyngeal Swab     Status: None   Collection Time: 05/04/20 12:29 PM   Specimen: Nasopharyngeal Swab  Result Value Ref Range Status   SARS Coronavirus 2 NEGATIVE NEGATIVE Final    Comment: (NOTE) SARS-CoV-2 target nucleic acids are NOT DETECTED.  The SARS-CoV-2 RNA is generally detectable in upper and lower respiratory specimens during the acute phase of infection. The lowest concentration of SARS-CoV-2 viral copies this assay can detect is 250 copies / mL. A negative result does not preclude SARS-CoV-2 infection and should not be used as the sole basis for treatment or other patient management decisions.  A negative result may occur with improper specimen  collection / handling, submission of specimen other than nasopharyngeal swab, presence of viral mutation(s) within the areas targeted by this assay, and inadequate number of viral copies (<250 copies / mL). A negative result must be combined with clinical observations, patient history, and epidemiological information.  Fact Sheet for Patients:   StrictlyIdeas.no  Fact Sheet for Healthcare Providers: BankingDealers.co.za  This test is not yet approved or  cleared by the [place] FDA and has been authorized for detection and/or diagnosis of SARS-CoV-2 by FDA under an Emergency Use Authorization (EUA).  This EUA will remain in effect (meaning this test can be used) for the duration of the COVID-19 declaration under Section 564(b)(1) of the Act, 21 U.S.C. section 360bbb-3(b)(1), unless the authorization is terminated or revoked sooner.  Performed at [facility], [place] [address], [place], [place] [address]   Gram stain     Status: None   Collection Time: 05/04/20  2:39 PM   Specimen: PATH Cytology Peritoneal fluid  Result Value Ref Range Status   Specimen Description PERITONEAL  Final   Special Requests NONE  Final   Gram Stain   Final    CYTOSPIN SMEAR WBC PRESENT,BOTH PMN AND MONONUCLEAR NO ORGANISMS SEEN Performed at [facility], [place] [address] [place], [place] [address]    Report Status 05/04/2020 FINAL  Final  Culture, body fluid-bottle     Status: None (Preliminary result)   Collection Time: 05/04/20  2:39 PM   Specimen: Peritoneal Washings  Result Value Ref Range Status   Specimen Description PERITONEAL  Final   Special Requests NONE  Final   Culture   Final    NO GROWTH 3 DAYS Performed at [place] [address], [place], [place] [address]    Report Status PENDING  Incomplete     Time coordinating discharge: Over 30 minutes  SIGNED:   Darliss Cheney, MD  [facility] 05/08/2020,  2:00 PM  If 7PM-7AM, please contact night-coverage [email or web address]

## 2020-05-08 NOTE — Telephone Encounter (Signed)
Pt husband [name] called said she needs something for his wife backside. She is very raw from being in bed for months.  Call back at [phone]  Please advise

## 2020-05-08 NOTE — Progress Notes (Signed)
DISCHARGE NOTE HOME Amanda [name] to be discharged to home per MD order. Discussed prescriptions and follow up appointments with the patient and [name]. Prescriptions given to patient and her husband; medication list explained in detail. Patient and herhusband verbalized understanding.  Skin clean, dry and intact without evidence of skin break down, no evidence of skin tears noted. IV catheter discontinued intact. Site without signs and symptoms of complications. Dressing and pressure applied. Pt denies pain at the site currently. No complaints noted.  IV Team hep lock porta cath. Patient free of lines, drains, and wounds.   An After Visit Summary (AVS) was printed and given to the patient. Patient escorted via EMS, and discharged home via EMS.  [place], Zenon Mayo, RN

## 2020-05-08 NOTE — Procedures (Signed)
PROCEDURE SUMMARY:  Successful US guided paracentesis from right lateral abdomen.  Yielded 1.9 liters of clear yellow fluid.  No immediate complications.  Patient tolerated well.  EBL = trace   Micalah Cabezas S Alvis Pulcini PA-C 05/08/2020 4:17 PM

## 2020-05-08 NOTE — Progress Notes (Signed)
Occupational Therapy Treatment Patient Details Name: Amanda [name] MRN: [id number] DOB: [birth year] Today's Date: 05/08/2020    History of present illness Amanda [name] is a 79 y.o. female with medical history significant of polycythemia; CAD s/p stents; HTN; HLD; DM; obesity; and cirrhosis presenting with n/v/d.  She reports that she couldn't breathe and hasn't felt well.  Like the "whole world was caving in on me."  This has been going on for a couple of days.  Her husband reports that she came home from SNF and wasn't doing well then - in bed, still couldn't walk right, was eating and drinking ok.   OT comments  Patient working with RN and NT to transfer from chair to bed with maximove when therapist arrived.  Patient needing cleaned up after BM.  Required max assist with rolling in bed and min assist to stay sidelying.  Total assist for toileting hygiene and LB bath.  Initiated transfer with lift back to chair but left in bed as patient being taken off floor.  Completed UE strengthening exercises while in bed to work on improving strength as well as command following.  Will continue to follow with OT acutely to address the deficits listed below.    Follow Up Recommendations  SNF;Supervision/Assistance - 24 hour;Other (comment) (or long term care/ALF, or hospice)    Equipment Recommendations  Other (comment) (hoyer lift if going home)    Recommendations for Other Services      Precautions / Restrictions Precautions Precautions: Fall       Mobility Bed Mobility Overal bed mobility: Needs Assistance Bed Mobility: Rolling Rolling: Max assist         General bed mobility comments: Bed mobility limited to rolling for hygeine and placement of Maximove pad  Transfers Overall transfer level: Needs assistance               General transfer comment: Dependent Maximove transfer from recliner to chair for hygiene.  Left in bed as patient being taken off floor    Balance                                            ADL either performed or assessed with clinical judgement   ADL Overall ADL's : Needs assistance/impaired             Lower Body Bathing: Total assistance;Bed level               Toileting- Clothing Manipulation and Hygiene: Total assistance;Bed level         General ADL Comments: Requiring toileting hygiene/LB bath for bowel incontinence     Vision       Perception     Praxis      Cognition Arousal/Alertness: Awake/alert Behavior During Therapy: Anxious Overall Cognitive Status: Impaired/Different from baseline Area of Impairment: Orientation;Attention;Memory;Following commands;Safety/judgement;Awareness;Problem solving                 Orientation Level: Disoriented to;Place;Time;Situation Current Attention Level: Sustained Memory: Decreased short-term memory Following Commands: Follows one step commands inconsistently;Follows one step commands with increased time Safety/Judgement: Decreased awareness of safety;Decreased awareness of deficits Awareness: Intellectual Problem Solving: Slow processing;Decreased initiation;Difficulty sequencing;Requires verbal cues;Requires tactile cues General Comments: Patient anxious about movement and with hygiene in bed.  Requiring cues for command following         Exercises Exercises: General Upper Extremity General  Exercises - Upper Extremity Shoulder Flexion: AROM;Strengthening;Both;5 reps;Supine Shoulder Extension: AROM;Strengthening;Both;5 reps;Supine Elbow Flexion: AROM;Strengthening;Both;5 reps;Supine Elbow Extension: AROM;Strengthening;Both;5 reps;Supine   Shoulder Instructions       General Comments      Pertinent Vitals/ Pain       Pain Assessment: Faces Faces Pain Scale: Hurts little more Pain Location: buttocks area when wiping/cleaning skin Pain Descriptors / Indicators: Tender Pain Intervention(s): Limited activity within patient's  tolerance;Monitored during session;Repositioned  Home Living                                          Prior Functioning/Environment              Frequency  Min 2X/week        Progress Toward Goals  OT Goals(current goals can now be found in the care plan section)  Progress towards OT goals: Progressing toward goals  Acute Rehab OT Goals Patient Stated Goal: Did not state OT Goal Formulation: With patient Time For Goal Achievement: 05/19/20 Potential to Achieve Goals: [place] Discharge plan remains appropriate    Co-evaluation                 AM-PAC OT "6 Clicks" Daily Activity     Outcome Measure   Help from another person eating meals?: A Little Help from another person taking care of personal grooming?: A Little Help from another person toileting, which includes using toliet, bedpan, or urinal?: Total Help from another person bathing (including washing, rinsing, drying)?: Total Help from another person to put on and taking off regular upper body clothing?: A Lot Help from another person to put on and taking off regular lower body clothing?: Total 6 Click Score: 11    End of Session Equipment Utilized During Treatment: Other (comment) (maximove)  OT Visit Diagnosis: Unsteadiness on feet (R26.81);Muscle weakness (generalized) (M62.81);Other symptoms and signs involving cognitive function;Pain Pain - part of body:  (buttocks)   Activity Tolerance Patient tolerated treatment well   Patient Left in bed;with call bell/phone within reach;with bed alarm set;Other (comment) (transport entering room to take patient off floor)   Nurse Communication Mobility status;Need for lift equipment        Time: 0938-1829 OT Time Calculation (min): 13 min  Charges: OT General Charges $OT Visit: 1 Visit OT Treatments $Self Care/Home Management : 8-22 mins  August Luz, OTR/L    Phylliss Bob 05/08/2020, 12:13 PM

## 2020-05-08 NOTE — Discharge Instructions (Signed)
Ascites  Ascites is a collection of too much fluid in the abdomen. Ascites can range from mild to severe. If ascites is not treated, it can get worse. What are the causes? This condition may be caused by:  A liver condition called cirrhosis. This is the most common cause of ascites.  Long-term (chronic) or alcoholic hepatitis.  Infection or inflammation in the abdomen.  Cancer in the abdomen.  Heart failure.  Kidney disease.  Inflammation of the pancreas.  Clots in the veins of the liver. What are the signs or symptoms? Symptoms of this condition include:  A feeling of fullness in the abdomen. This is common.  An increase in the size of the abdomen or waist.  Swelling in the legs.  Swelling of the scrotum (in men).  Difficulty breathing.  Pain in the abdomen.  Sudden weight gain. If the condition is mild, you may not have symptoms. How is this diagnosed? This condition is diagnosed based on your medical history and a physical exam. Your health care provider may order imaging tests, such as an ultrasound or CT scan of your abdomen. How is this treated? Treatment for this condition depends on the cause of the ascites. It may include:  Taking a pill to make you urinate. This is called a water pill (diuretic pill).  Strictly reducing your salt (sodium) intake. Salt can cause extra fluid to be kept (retained) in the body, and this makes ascites worse.  Having a procedure to remove fluid from your abdomen (paracentesis).  Having a procedure that connects two of the major veins within your liver and relieves pressure on your liver. This is called a TIPS procedure (transjugular intrahepatic portosystemic shunt procedure).  Placement of a drainage catheter (peritoneovenous shunt) to manage the extra fluid in the abdomen. Ascites may go away or improve when the condition that caused it is treated. Follow these instructions at home:  Keep track of your weight. To do this,  weigh yourself at the same time every day and write down your weight.  Keep track of how much you drink and any changes in how much or how often you urinate.  Follow any instructions that your health care provider gives you about how much to drink.  Try not to eat salty (high-sodium) foods.  Take over-the-counter and prescription medicines only as told by your health care provider.  Keep all follow-up visits as told by your health care provider. This is important.  Report any changes in your health to your health care provider, especially if you develop new symptoms or your symptoms get worse. Contact a health care provider if:  You gain more than 3 lb (1.36 kg) in 3 days.  Your waist size increases.  You have new swelling in your legs.  The swelling in your legs gets worse. Get help right away if:  You have a fever.  You are confused.  You have new or worsening breathing trouble.  You have new or worsening pain in your abdomen.  You have new or worsening swelling in the scrotum (in men). Summary  Ascites is a collection of too much fluid in the abdomen.  Ascites may be caused by various conditions, such as cirrhosis, hepatitis, cancer, or congestive heart failure.  Symptoms may include swelling of the abdomen and other areas due to extra fluid in the body.  Treatments may involve dietary changes, medicines, or procedures. This information is not intended to replace advice given to you by your health care  provider. Make sure you discuss any questions you have with your health care provider. Document Revised: 09/05/2018 Document Reviewed: 06/16/2017 Elsevier Patient Education  [place].

## 2020-05-08 NOTE — Plan of Care (Signed)
  Problem: Nutrition: Goal: Adequate nutrition will be maintained Outcome: Progressing   

## 2020-05-08 NOTE — Progress Notes (Signed)
[facility]:  [employer] [facility]   Discussion about hospice care at home with pt. Husband. Introduced to the comfort approach and also the hospice services under hospice benefit. Pt spouse is in agreement to hospice care. She has been approved for hospice services by our MD and we will work toward d/c plan with hospital and family to accommodate their wishes.  Ordered hoyer lift and over bed table for delivery today.  Webb Silversmith RN [phone]

## 2020-05-08 NOTE — Progress Notes (Signed)
Overall, Amanda [name] is declining from my point of view.  I do not think this is anything to do with her myeloproliferative problem.  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  It looks like she has more ascites.  I probably would try to do another paracentesis on her.  She is not eating much.  Looks like she is lost quite a bit of weight.  I would keep her off [place].  CBC shows a white cell count of 32.6.  Hemoglobin 11.9.  Platelet count 279,000.  She has a preponderance of neutrophils.  This might indicate an underlying infectious issue.  Her blood sugar is 129.  Her albumin is 2.4.  Her creatinine is 2.39.  The LDH is 196.  She is not walking.  She is just weak.  I really think that Hospice is the way to go with her.  Again, she has this myeloproliferative issue.  However, this is not going to be the cause of her ultimate passing.  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  This is probably source or other cause of the splenomegaly.  I can just tell she is weak.  I hope that her husband can take care of her at home.  Again, I think hospice would be a great idea for her.  Ultimately, I suspect that she probably will have to go to [facility] as it would be difficult for her husband to take care of her.  I really hate this for Amanda [name].  She has been very nice.  She has brought the office quite a bit of food that she is cooked.  We have enjoyed her cooking talent.  This is all about quality of life.  I agree with the DNR status.  I appreciate the outstanding care that she is getting from all the staff up on [facility].   Amanda Haw, MD  Hebrews 12:12

## 2020-05-08 NOTE — Consult Note (Signed)
   [facility] Inpatient Consult   05/08/2020  Amanda [name] 24-Mar-1941 [id number]   [name] Patient: [name]   Patient screened for extreme high risk for unplanned readmission score and for hospitalizations to check if potential [name] service needs.  Chart review reveals  [name] Care Management team member attempted outreach in June. However, husband declined services noted.  Review of patient's medical record reveals patient is currently for home with hospice care.   Plan: Needs to be met a the hospice level of care, no current [name] Care Management needs assessed at this time.  For questions contact:   Natividad Brood, RN BSN [facility] Liaison  [phone] business mobile phone Toll free office [phone]  Fax number: [phone] [name]

## 2020-05-09 ENCOUNTER — Telehealth: Payer: Self-pay | Admitting: Adult Health

## 2020-05-09 LAB — CULTURE, BODY FLUID W GRAM STAIN -BOTTLE: Culture: NO GROWTH

## 2020-05-09 NOTE — Telephone Encounter (Signed)
Called and spoke with patient's husband and he informed me that patient is now on hospice care instead of palliative care. No Transition of care needed

## 2020-05-15 ENCOUNTER — Other Ambulatory Visit (HOSPITAL_COMMUNITY): Payer: Self-pay | Admitting: Adult Health

## 2020-05-15 DIAGNOSIS — R188 Other ascites: Secondary | ICD-10-CM

## 2020-05-15 DIAGNOSIS — K7581 Nonalcoholic steatohepatitis (NASH): Secondary | ICD-10-CM

## 2020-05-16 ENCOUNTER — Encounter (HOSPITAL_COMMUNITY): Payer: Self-pay

## 2020-05-16 ENCOUNTER — Other Ambulatory Visit (HOSPITAL_COMMUNITY): Payer: Self-pay | Admitting: Adult Health

## 2020-05-16 ENCOUNTER — Ambulatory Visit (HOSPITAL_COMMUNITY)
Admission: RE | Admit: 2020-05-16 | Discharge: 2020-05-16 | Disposition: A | Discharge status: Home / Self Care | Source: Ambulatory Visit | Attending: Adult Health | Admitting: Adult Health

## 2020-05-16 ENCOUNTER — Other Ambulatory Visit: Payer: Self-pay

## 2020-05-16 DIAGNOSIS — I10 Essential (primary) hypertension: Secondary | ICD-10-CM | POA: Insufficient documentation

## 2020-05-16 DIAGNOSIS — R410 Disorientation, unspecified: Secondary | ICD-10-CM | POA: Diagnosis not present

## 2020-05-16 DIAGNOSIS — K7460 Unspecified cirrhosis of liver: Secondary | ICD-10-CM | POA: Insufficient documentation

## 2020-05-16 DIAGNOSIS — E785 Hyperlipidemia, unspecified: Secondary | ICD-10-CM | POA: Diagnosis not present

## 2020-05-16 DIAGNOSIS — M109 Gout, unspecified: Secondary | ICD-10-CM | POA: Diagnosis not present

## 2020-05-16 DIAGNOSIS — Z87891 Personal history of nicotine dependence: Secondary | ICD-10-CM | POA: Insufficient documentation

## 2020-05-16 DIAGNOSIS — R188 Other ascites: Secondary | ICD-10-CM | POA: Insufficient documentation

## 2020-05-16 DIAGNOSIS — R0902 Hypoxemia: Secondary | ICD-10-CM | POA: Diagnosis not present

## 2020-05-16 DIAGNOSIS — I2510 Atherosclerotic heart disease of native coronary artery without angina pectoris: Secondary | ICD-10-CM | POA: Insufficient documentation

## 2020-05-16 DIAGNOSIS — I252 Old myocardial infarction: Secondary | ICD-10-CM | POA: Diagnosis not present

## 2020-05-16 DIAGNOSIS — Z955 Presence of coronary angioplasty implant and graft: Secondary | ICD-10-CM | POA: Diagnosis not present

## 2020-05-16 DIAGNOSIS — R531 Weakness: Secondary | ICD-10-CM | POA: Diagnosis not present

## 2020-05-16 DIAGNOSIS — Z79899 Other long term (current) drug therapy: Secondary | ICD-10-CM | POA: Diagnosis not present

## 2020-05-16 DIAGNOSIS — E669 Obesity, unspecified: Secondary | ICD-10-CM | POA: Insufficient documentation

## 2020-05-16 DIAGNOSIS — F329 Major depressive disorder, single episode, unspecified: Secondary | ICD-10-CM | POA: Insufficient documentation

## 2020-05-16 DIAGNOSIS — E119 Type 2 diabetes mellitus without complications: Secondary | ICD-10-CM | POA: Diagnosis not present

## 2020-05-16 DIAGNOSIS — M2550 Pain in unspecified joint: Secondary | ICD-10-CM | POA: Diagnosis not present

## 2020-05-16 DIAGNOSIS — R52 Pain, unspecified: Secondary | ICD-10-CM | POA: Diagnosis not present

## 2020-05-16 DIAGNOSIS — R5381 Other malaise: Secondary | ICD-10-CM | POA: Diagnosis not present

## 2020-05-16 DIAGNOSIS — Z7902 Long term (current) use of antithrombotics/antiplatelets: Secondary | ICD-10-CM | POA: Insufficient documentation

## 2020-05-16 DIAGNOSIS — Z794 Long term (current) use of insulin: Secondary | ICD-10-CM | POA: Insufficient documentation

## 2020-05-16 DIAGNOSIS — Z7401 Bed confinement status: Secondary | ICD-10-CM | POA: Diagnosis not present

## 2020-05-16 HISTORY — PX: IR PERC TUN PERIT CATH WO PORT S&I /IMAG: IMG2327

## 2020-05-16 LAB — GLUCOSE, CAPILLARY: Glucose-Capillary: 154 mg/dL — ABNORMAL HIGH (ref 70–99)

## 2020-05-16 MED ORDER — FENTANYL CITRATE (PF) 100 MCG/2ML IJ SOLN
INTRAMUSCULAR | Status: AC
  Filled 2020-05-16: qty 2

## 2020-05-16 MED ORDER — LIDOCAINE HCL 1 % IJ SOLN
INTRAMUSCULAR | Status: AC
  Filled 2020-05-16: qty 20

## 2020-05-16 MED ORDER — VANCOMYCIN HCL IN DEXTROSE 1-5 GM/200ML-% IV SOLN: 1000.0000 mg | Freq: Once | INTRAVENOUS | Status: AC

## 2020-05-16 MED ORDER — LIDOCAINE HCL 1 % IJ SOLN
INTRAMUSCULAR | Status: AC | PRN
  Administered 2020-05-16: 15 mL

## 2020-05-16 MED ORDER — MIDAZOLAM HCL 2 MG/2ML IJ SOLN
INTRAMUSCULAR | Status: AC
  Filled 2020-05-16: qty 2

## 2020-05-16 MED ORDER — VANCOMYCIN HCL IN DEXTROSE 1-5 GM/200ML-% IV SOLN
INTRAVENOUS | Status: AC
  Administered 2020-05-16: 1000 mg via INTRAVENOUS
  Filled 2020-05-16: qty 200

## 2020-05-16 MED ORDER — MIDAZOLAM HCL 2 MG/2ML IJ SOLN
INTRAMUSCULAR | Status: AC | PRN
  Administered 2020-05-16 (×3): 0.5 mg via INTRAVENOUS

## 2020-05-16 MED ORDER — FENTANYL CITRATE (PF) 100 MCG/2ML IJ SOLN
INTRAMUSCULAR | Status: AC | PRN
  Administered 2020-05-16 (×3): 25 ug via INTRAVENOUS

## 2020-05-16 NOTE — Sedation Documentation (Signed)
PTAR information along with device equipemnt paperwork sent with the patient to [facility].

## 2020-05-16 NOTE — H&P (Signed)
Chief Complaint: Patient was seen in consultation today for recurrent ascites  Referring Physician(s): Nafziger,Cory  Supervising Physician: Sandi Mariscal  Patient Status: [facility] - Out-pt  History of Present Illness: Amanda [name] is a 79 y.o. female with past medical history of DM, HLD, HTN, polycythemia vera, and cirrhosis with recurrent ascites who has entered hospice care for management of her terminal conditions.  She does have a history of slowly reaccumulating ascites which has required paracentesis in the past.  She is non-mobile and is cared for at home by her elderly husband.  Due to impact of her ascites, ascites management, and requirement to arrange for medical transport for all appointment, plan is made for abdominal PleurX placement today.   Patient presents to Radiology in her usual state of health. She is weak, intermittently confused.  She and husband agree to proceed with PleurX placement today.   Past Medical History:  Diagnosis Date  . Allergic rhinitis 01/23/2016  . C. difficile colitis   . CAD (coronary artery disease)    a. s/p STEMI in 01/2015 with 95% LCx stenosis and distal 80% LCx stenosis (DESx2 placed)  . Cancer ([place])    SKIN  . Cirrhosis ([place])    from fatty liver  . Depression   . Diabetes mellitus 2008  . Gout   . Herpes simplex   . Hyperlipidemia   . Hypertension   . Obesity   . Polycythemia    Dr. Elease Hashimoto- [facility]  . Psoriasis     Past Surgical History:  Procedure Laterality Date  . ANKLE FRACTURE SURGERY    . BIOPSY  05/26/2018   Procedure: BIOPSY;  Surgeon: [name], MD;  Location: [facility] ENDOSCOPY;  Service: Endoscopy;;  . BIOPSY  02/19/2020   Procedure: BIOPSY;  Surgeon: Thornton Park, MD;  Location: [place];  Service: Gastroenterology;;  . BREAST SURGERY Left    milk duct  . CATARACT EXTRACTION, BILATERAL    . COLONOSCOPY    . ENTEROSCOPY N/A 02/19/2020   Procedure: ENTEROSCOPY;  Surgeon: Thornton Park, MD;   Location: [place];  Service: Gastroenterology;  Laterality: N/A;  . ESOPHAGOGASTRODUODENOSCOPY N/A 05/26/2018   Procedure: ESOPHAGOGASTRODUODENOSCOPY (EGD);  Surgeon: [name], MD;  Location: [facility] ENDOSCOPY;  Service: Endoscopy;  Laterality: N/A;  . ESOPHAGOGASTRODUODENOSCOPY (EGD) WITH PROPOFOL N/A 01/21/2020   Procedure: ESOPHAGOGASTRODUODENOSCOPY (EGD) WITH PROPOFOL;  Surgeon: Yetta Flock, MD;  Location: [place];  Service: Gastroenterology;  Laterality: N/A;  . HEMOSTASIS CLIP PLACEMENT  01/21/2020   Procedure: HEMOSTASIS CLIP PLACEMENT;  Surgeon: Yetta Flock, MD;  Location: [place] ENDOSCOPY;  Service: Gastroenterology;;  . HOT HEMOSTASIS N/A 01/21/2020   Procedure: HOT HEMOSTASIS (ARGON PLASMA COAGULATION/BICAP);  Surgeon: Yetta Flock, MD;  Location: [facility] ENDOSCOPY;  Service: Gastroenterology;  Laterality: N/A;  . HOT HEMOSTASIS N/A 02/19/2020   Procedure: HOT HEMOSTASIS (ARGON PLASMA COAGULATION/BICAP);  Surgeon: Thornton Park, MD;  Location: [place];  Service: Gastroenterology;  Laterality: N/A;  . IR IMAGING GUIDED PORT INSERTION  08/30/2019  . IR PARACENTESIS  05/25/2018  . IR PARACENTESIS  05/08/2020  . LEFT HEART CATHETERIZATION WITH CORONARY ANGIOGRAM N/A 01/27/2015   Procedure: LEFT HEART CATHETERIZATION WITH CORONARY ANGIOGRAM;  Surgeon: [name], MD;  Location: [facility] CATH LAB;  Service: Cardiovascular;  Laterality: N/A;  . TONSILLECTOMY    . TOTAL ABDOMINAL HYSTERECTOMY W/ BILATERAL SALPINGOOPHORECTOMY     for heavy periods with appendectomy    Allergies: Doxycycline, Penicillins, Lisinopril, Tape, and Latex  Medications: Prior to Admission medications  Medication Sig Start Date End Date Taking? Authorizing Provider  amLODipine (NORVASC) 5 MG tablet Take 5 mg by mouth daily.    [provider]  Blood Glucose Monitoring Suppl (ACCU-CHEK GUIDE) w/Device KIT 1 kit by Does not apply route as directed. 01/25/18   [name], MD    citalopram (CELEXA) 20 MG tablet Take 1 tablet (20 mg total) by mouth daily. 04/15/20 07/14/20  Nafziger, Tommi Rumps, NP  clopidogrel (PLAVIX) 75 MG tablet Take 75 mg by mouth daily.  02/29/20   [provider]  Continuous Blood Gluc Receiver (FREESTYLE LIBRE 2 READER) DEVI For continuous blood glucose monitoring. E11.42 04/18/20   [name], MD  Continuous Blood Gluc Sensor (FREESTYLE LIBRE 2 SENSOR) MISC For continuous blood glucose monitoring. E11.42 04/18/20   [name], MD  diphenoxylate-atropine (LOMOTIL) 2.5-0.025 MG tablet Take 1 tablet by mouth 4 (four) times daily as needed for diarrhea or loose stools. 04/15/20   Nafziger, Tommi Rumps, NP  ezetimibe (ZETIA) 10 MG tablet Take 1 tablet (10 mg total) by mouth daily. 02/13/20   [name], MD  fluticasone (FLONASE) 50 MCG/ACT nasal spray Place 1 spray into both nostrils daily.    [provider]  glucose blood (ACCU-CHEK GUIDE) test strip Use to check blood sugars 3 times daily 01/25/18   [name], MD  insulin aspart (NOVOLOG) 100 UNIT/ML FlexPen 0-9 Units, Subcutaneous, 3 times daily with meals CBG < 70: Implement Hypoglycemia measures, Call primary MD CBG 70 - 120: 0 units CBG 121 - 150: 1 unit CBG 151 - 200: 2 units CBG 201 - 250: 3 units CBG 251 - 300: 5 units CBG 301 - 350: 7 units CBG 351 - 400: 9 units CBG > 400: call primary MD Patient taking differently: Inject 0-9 Units into the skin 3 (three) times daily with meals. 0-9 Units, Subcutaneous, 3 times daily with meals CBG < 70: Implement Hypoglycemia measures, Call primary MD CBG 70 - 120: 0 units CBG 121 - 150: 1 unit CBG 151 - 200: 2 units CBG 201 - 250: 3 units CBG 251 - 300: 5 units CBG 301 - 350: 7 units CBG 351 - 400: 9 units CBG > 400: call primary MD 04/02/20   Jonetta Osgood, MD  Insulin Pen Needle 32G X 4 MM MISC 1 Device by Does not apply route in the morning, at noon, in the evening, and at bedtime. 04/07/20   Shamleffer, Melanie Crazier, MD   latanoprost (XALATAN) 0.005 % ophthalmic solution Place 1 drop into both eyes at bedtime.  03/23/19   [provider]  lidocaine-prilocaine (EMLA) cream Apply 1 application topically as needed. Please apply EMLA over the Port-A-Cath site 1 hour prior to coming to our office. Patient taking differently: Apply 1 application topically daily as needed (for port).  08/17/19   Volanda Napoleon, MD  liver oil-zinc oxide (DESITIN) 40 % ointment Apply 1 application topically as needed for irritation. 05/08/20   Nafziger, Tommi Rumps, NP  loperamide (IMODIUM A-D) 2 MG tablet Take 2 mg by mouth 4 (four) times daily as needed for diarrhea or loose stools.    [provider]  meclizine (ANTIVERT) 25 MG tablet TAKE 1 TABLET(25 MG) BY MOUTH THREE TIMES DAILY AS NEEDED FOR DIZZINESS Patient taking differently: Take 25 mg by mouth 3 (three) times daily as needed for dizziness.  04/30/20   Nafziger, Tommi Rumps, NP  mirtazapine (REMERON) 7.5 MG tablet Take 7.5 mg by mouth at bedtime.    [provider]  ondansetron (ZOFRAN ODT) 4 MG disintegrating tablet Take 1 tablet (4 mg total) by mouth every 8 (eight) hours as needed. Patient taking differently: Take 4 mg by mouth every 8 (eight) hours as needed for nausea or vomiting.  05/02/20   Nafziger, Tommi Rumps, NP  ONETOUCH VERIO test strip USE TO TEST BLOOD SUGAR THREE TIMES DAILY 05/01/18   [name], MD  pantoprazole (PROTONIX) 40 MG tablet Take 1 tablet (40 mg total) by mouth 2 (two) times daily before a meal. Patient not taking: Reported on 05/04/2020 03/18/20 06/16/20  [name], NP  triamcinolone cream (KENALOG) 0.1 % Apply 1 application topically 2 (two) times daily. Patient taking differently: Apply 1 application topically 2 (two) times daily as needed (rash).  12/11/19   Nafziger, Tommi Rumps, NP  XIFAXAN 550 MG TABS tablet Take 1 tablet (550 mg total) by mouth 2 (two) times daily. 04/08/20 07/07/20  Milus Banister, MD     Family History  Problem Relation  Age of Onset  . Diabetes Mother   . Heart disease Mother        CAD  . Hyperlipidemia Mother   . Hypertension Mother   . Kidney disease Mother   . Kidney disease Father   . Breast cancer Maternal Aunt   . Heart disease Maternal Grandmother   . Heart disease Other        maternal aunts and uncles  . Colon cancer Neg Hx   . Esophageal cancer Neg Hx     Social History   Socioeconomic History  . Marital status: Married    Spouse name: [name]  . Number of children: 2  . Years of education: 2 yr colle  . Highest education level: Not on file  Occupational History  . Occupation: housewife  Tobacco Use  . Smoking status: Former Smoker    Years: 48.00    Types: Cigarettes    Quit date: 10/18/1986    Years since quitting: 33.6  . Smokeless tobacco: Never Used  Vaping Use  . Vaping Use: Never used  Substance and Sexual Activity  . Alcohol use: No    Alcohol/week: 0.0 standard drinks  . Drug use: No  . Sexual activity: Yes    Partners: Male    Birth control/protection: None  Other Topics Concern  . Not on file  Social History Narrative   2 caffeine drink per day.  No regular exercise.     Retired from the bank   2 children (Daughter and a son) son has morbid obesity   2 grandchildren   3 great grandchildren   Social Determinants of Radio broadcast assistant Strain:   . Difficulty of Paying Living Expenses:   Food Insecurity:   . Worried About Charity fundraiser in the Last Year:   . Arboriculturist in the Last Year:   Transportation Needs:   . Film/video editor (Medical):   Marland Kitchen Lack of Transportation (Non-Medical):   Physical Activity:   . Days of Exercise per Week:   . Minutes of Exercise per Session:   Stress:   . Feeling of Stress :   Social Connections:   . Frequency of Communication with Friends and Family:   . Frequency of Social Gatherings with Friends and Family:   . Attends Religious Services:   . Active Member of Clubs or Organizations:   . Attends  Archivist Meetings:   Marland Kitchen Marital Status:      Review of  Systems: A 12 point ROS discussed and pertinent positives are indicated in the HPI above.  All other systems are negative.  Review of Systems  Constitutional: Negative for fatigue and fever.  Respiratory: Negative for cough and shortness of breath.   Cardiovascular: Negative for chest pain.  Gastrointestinal: Negative for abdominal pain.  Genitourinary: Negative for dysuria.  Musculoskeletal: Negative for back pain.  Psychiatric/Behavioral: Negative for behavioral problems and confusion.    Vital Signs: There were no vitals taken for this visit.  Physical Exam Vitals and nursing note reviewed.  Constitutional:      Appearance: Normal appearance.  HENT:     Mouth/Throat:     Mouth: Mucous membranes are moist.     Pharynx: Oropharynx is clear.  Cardiovascular:     Rate and Rhythm: Normal rate and regular rhythm.  Pulmonary:     Effort: Pulmonary effort is normal. No respiratory distress.     Breath sounds: Normal breath sounds.  Abdominal:     General: There is distension.     Palpations: Abdomen is soft.  Skin:    General: Skin is warm and dry.  Neurological:     General: No focal deficit present.     Mental Status: She is alert and oriented to person, place, and time. Mental status is at baseline.      MD Evaluation Airway: WNL Heart: WNL Abdomen: WNL Chest/ Lungs: WNL ASA  Classification: 3 Mallampati/Airway Score: Two   Imaging: CT ABDOMEN PELVIS W CONTRAST  Result Date: 05/04/2020 CLINICAL DATA:  Generalized abdominal pain, nausea, vomiting. EXAM: CT ABDOMEN AND PELVIS WITH CONTRAST TECHNIQUE: Multidetector CT imaging of the abdomen and pelvis was performed using the standard protocol following bolus administration of intravenous contrast. CONTRAST:  3m OMNIPAQUE IOHEXOL 300 MG/ML  SOLN COMPARISON:  Feb 20, 2020. FINDINGS: Lower chest: No acute abnormality. Hepatobiliary: Findings consistent  with hepatic cirrhosis. Minimal cholelithiasis is noted. No biliary dilatation is noted. Stable heterogeneity of hepatic parenchyma is noted without a dominant focal lesion. Pancreas: Unremarkable. No pancreatic ductal dilatation or surrounding inflammatory changes. Spleen: Mild splenomegaly is noted. Adrenals/Urinary Tract: Adrenal glands are unremarkable. Kidneys are normal, without renal calculi, focal lesion, or hydronephrosis. Bladder is unremarkable. Stomach/Bowel: The stomach appears normal. There is no evidence of bowel obstruction or inflammation. The appendix is not visualized, but no inflammation is noted in the right lower quadrant. Vascular/Lymphatic: Aortic atherosclerosis. No enlarged abdominal or pelvic lymph nodes. Reproductive: Uterus and bilateral adnexa are unremarkable. Other: Mild ascites is noted, including around the liver and spleen. No definite hernia is noted. Musculoskeletal: No acute or significant osseous findings. IMPRESSION: 1. Findings consistent with hepatic cirrhosis with mild splenomegaly and mild ascites. 2. Minimal cholelithiasis. Aortic Atherosclerosis (ICD10-I70.0). Electronically Signed   By: JMarijo ConceptionM.D.   On: 05/04/2020 10:15   [place]NAL  Result Date: 05/05/2020 CLINICAL DATA:  Acute CT in. EXAM: RENAL / URINARY TRACT ULTRASOUND COMPLETE COMPARISON:  None. FINDINGS: Right Kidney: Renal measurements: 10.9 x 4.9 x 5.2 cm = volume: 144 mL. Increased cortical echogenicity. No mass or hydronephrosis visualized. Left Kidney: Renal measurements: 10.5 x 4.9 x 4.3 cm = volume: 111 mL. Increased cortical echogenicity. No mass or hydronephrosis visualized. Bladder: Appears normal for degree of bladder distention. Other: Incidental note of small volume ascites. IMPRESSION: 1. Increased cortical echogenicity, in keeping with medical renal disease. No hydronephrosis. 2.  Incidental note of small volume ascites. Electronically Signed   By: AEddie CandleM.D.   On: 05/05/2020  09:32   US Paracentesis  Result Date: 05/04/2020 INDICATION: Recurrent ascites NASH Cirrhosis EXAM: ULTRASOUND GUIDED LLQ PARACENTESIS MEDICATIONS: 10 cc 1% lidocaine COMPLICATIONS: None immediate. PROCEDURE: Informed written consent was obtained from the patient after a discussion of the risks, benefits and alternatives to treatment. A timeout was performed prior to the initiation of the procedure. Initial ultrasound scanning demonstrates a large amount of ascites within the left lower abdominal quadrant. The left lower abdomen was prepped and draped in the usual sterile fashion. 1% lidocaine was used for local anesthesia. Following this, a 92 G Yueh catheter was introduced. An ultrasound image was saved for documentation purposes. The paracentesis was performed. The catheter was removed and a dressing was applied. The patient tolerated the procedure well without immediate post procedural complication. Patient received post-procedure intravenous albumin; see nursing notes for details. FINDINGS: A total of approximately 3.3 Liters of dark yellow fluid was removed. Samples were sent to the laboratory as requested by the clinical team. IMPRESSION: Successful ultrasound-guided paracentesis yielding 3.3 liters of peritoneal fluid. Read by Lavonia Drafts [facility] Electronically Signed   By: Corrie Mckusick D.O.   On: 05/04/2020 14:43   DG Chest Portable 1 View  Result Date: 05/04/2020 CLINICAL DATA:  Weakness.  Nausea, vomiting and diarrhea. EXAM: PORTABLE CHEST 1 VIEW COMPARISON:  03/28/2020 FINDINGS: Right jugular porta catheter tip in the right atrium approximately 4 cm inferior to the superior cavoatrial junction. Stable enlarged cardiac silhouette. Poor inspiration with mild bibasilar atelectasis. Diffuse osteopenia. IMPRESSION: 1. Poor inspiration with mild bibasilar atelectasis. 2. Stable cardiomegaly. Electronically Signed   By: Claudie Revering M.D.   On: 05/04/2020 12:42   IR Paracentesis  Result Date:  05/08/2020 INDICATION: Cirrhosis with ascites.  Request for therapeutic paracentesis. EXAM: ULTRASOUND GUIDED PARACENTESIS MEDICATIONS: 1% lidocaine 10 mL COMPLICATIONS: None immediate. PROCEDURE: Informed written consent was obtained from the patient after a discussion of the risks, benefits and alternatives to treatment. A timeout was performed prior to the initiation of the procedure. Initial ultrasound scanning demonstrates a moderate amount of ascites within the right lower abdominal quadrant. The right lower abdomen was prepped and draped in the usual sterile fashion. 1% lidocaine was used for local anesthesia. Following this, a 19 gauge, 7-cm, Yueh catheter was introduced. An ultrasound image was saved for documentation purposes. The paracentesis was performed. The catheter was removed and a dressing was applied. The patient tolerated the procedure well without immediate post procedural complication. FINDINGS: A total of approximately 1.9 L of clear yellow fluid was removed. IMPRESSION: Successful ultrasound-guided paracentesis yielding 1.9 liters of peritoneal fluid. Read by: Gareth Eagle, PA-C Electronically Signed   By: Corrie Mckusick D.O.   On: 05/08/2020 16:16    Labs:  CBC: Recent Labs    05/05/20 0510 05/06/20 0854 05/07/20 0309 05/08/20 0457  WBC 40.4* 27.4* 22.0* 32.6*  HGB 12.0 10.6* 10.6* 11.9*  HCT 41.2 33.8* 33.2* 38.2  PLT 250 214 174 279    COAGS: Recent Labs    12/30/19 0159 01/13/20 0055 02/16/20 1350 05/04/20 1932  INR 1.6* 1.5* 3.4* 1.6*    BMP: Recent Labs    05/06/20 0854 05/06/20 1604 05/07/20 0309 05/08/20 0457  NA 134* 132* 132* 131*  K 2.2* 2.9* 2.5* 4.3  CL 100 99 100 102  CO2 23 21* 22 20*  GLUCOSE 134* 140* 121* 129*  BUN 19 19 18 19   CALCIUM 7.8* 7.8* 7.7* 8.1*  CREATININE 2.18* 2.25* 2.35* 2.39*  GFRNONAA 21* 20* 19* 19*  GFRAA 24* 23* 22* 21*    LIVER FUNCTION TESTS: Recent Labs    05/05/20 1548 05/06/20 0854 05/07/20 0309  05/08/20 0457  BILITOT 1.8* 1.3* 1.3* 1.4*  AST 28 19 17 25   ALT 7 7 6 9   ALKPHOS 138* 109 106 144*  PROT 5.1* 4.4* 4.4* 5.0*  ALBUMIN 2.4* 2.1* 2.0* 2.4*    TUMOR MARKERS: No results for input(s): AFPTM, CEA, CA199, CHROMGRNA in the last 8760 hours.  Assessment and Plan: Patient with past medical history of cirrhosis with recurrent ascites presents for abdominal PleurX catheter placement at the request of [facility]. Case reviewed by Dr. Pascal Lux who approves patient for procedure.  Patient presents today in their usual state of health.  She has been NPO and is not currently on blood thinners.  PA did call and confirm with [facility] that an abdominal PleurX was warranted and that a plan is being made to arrange supplies.   Risks and benefits  was discussed with the patient including, but not limited to bleeding, infection, pneumothorax, or fibrin sheath development and need for additional procedures.  All of the patient's questions were answered, patient is agreeable to proceed. Consent signed and in chart.  Thank you for this interesting consult.  I greatly enjoyed meeting Amanda [name] and look forward to participating in their care.  A copy of this report was sent to the requesting provider on this date.  Electronically Signed: Docia Barrier, PA 05/16/2020, 10:36 AM   I spent a total of  30 Minutes   in face to face in clinical consultation, greater than 50% of which was counseling/coordinating care for ascites.

## 2020-05-16 NOTE — Discharge Instructions (Signed)
PleurX packet placed in box with PleurX supplies. 4 bottles given to pt.

## 2020-05-16 NOTE — Progress Notes (Signed)
Telephone Encounter  05/16/20 @ 1045 Spoke with Amanda [name] at [facility] regarding Amanda [name].  The documents for faxed to [phone] to be filled out and submitted for the patient to receive supplies for Pleurx Drain.  Documents were also sent with patient.   [facility]

## 2020-05-16 NOTE — Procedures (Signed)
Pre procedural Dx: Recurrent ascites; Hospice pt Post procedural Dx: Same  Technically successful image guided placement of a tunneled abdominal Pleur-X for recurrent ascites via the left lower abdomen.  EBL: Trace Complications: None immediate  Ronny Bacon, MD Pager #: [phone]

## 2020-05-19 ENCOUNTER — Ambulatory Visit (HOSPITAL_COMMUNITY): Payer: Medicare HMO

## 2020-05-20 ENCOUNTER — Encounter (HOSPITAL_COMMUNITY): Payer: Self-pay

## 2020-05-20 ENCOUNTER — Ambulatory Visit (HOSPITAL_COMMUNITY): Admission: RE | Admit: 2020-05-20 | Payer: Medicare HMO | Source: Ambulatory Visit

## 2020-05-21 ENCOUNTER — Telehealth: Payer: Self-pay | Admitting: *Deleted

## 2020-05-21 NOTE — Telephone Encounter (Signed)
Returned husband's phone call regarding wife. Husband stated,"I can't get her to take her medications. She is hardly eating or drinking. The hospice nurse got half her pills in her today. She crushed all the pills and gave in chocolate pudding. I suggested that he crush the medications and give in ice cream. Let the ice cream melt and give a little bit at a time. Then have her drink water to flush it down. He verbalized understanding.

## 2020-05-26 ENCOUNTER — Telehealth: Payer: Self-pay | Admitting: *Deleted

## 2020-05-26 NOTE — Telephone Encounter (Signed)
Message received from patient's husband requesting a call back from Dr. [name].  Call placed back to patient's husband and pt.'s husband states that pt is being transferred to the [facility] today via ambulance.  Dr. [name] notified.

## 2020-05-28 ENCOUNTER — Telehealth: Payer: Self-pay | Admitting: Adult Health

## 2020-05-28 NOTE — Telephone Encounter (Signed)
Mariann Laster said is saying Dr. Elease Hashimoto is to sign, date and fax orders back to her.  Davonna Belling is a PA and cannot do it.  Please give her a call

## 2020-05-28 NOTE — Telephone Encounter (Signed)
Left a detailed message on identified voicemail informing Amanda [name] to proceed with orders.  Advised a call back if needed.  Left phone # and my name.  Nothing further needed.  Will forward to [facility] as [place] [facility].

## 2020-05-28 NOTE — Telephone Encounter (Signed)
Mariann Laster with [facility] is calling for orders on the pt:  Resumption of care #[id number]  Occupational Therapy #[id number]  Please call her at [phone]  Order was faxed over 06/18/20021 and 05/02/2020

## 2020-05-29 NOTE — Telephone Encounter (Signed)
Spoke to [place] and advised that I do not have those order #s.  She will send to the fax in my pod.  I will watch for them to come and will then get them to Dr. Elease Hashimoto.

## 2020-06-04 ENCOUNTER — Inpatient Hospital Stay: Payer: Medicare HMO | Admitting: Family

## 2020-06-04 ENCOUNTER — Inpatient Hospital Stay: Payer: Medicare HMO

## 2020-06-04 NOTE — Telephone Encounter (Signed)
Called and spoke to [place].  She has received the orders.  Nothing further is needed.

## 2020-06-18 DEATH — deceased

## 2020-07-24 ENCOUNTER — Other Ambulatory Visit: Payer: Self-pay | Admitting: Adult Health

## 2020-07-24 DIAGNOSIS — F419 Anxiety disorder, unspecified: Secondary | ICD-10-CM

## 2020-08-01 ENCOUNTER — Ambulatory Visit: Payer: Medicare HMO | Admitting: Internal Medicine

## 2020-12-04 ENCOUNTER — Other Ambulatory Visit: Payer: Self-pay | Admitting: Adult Health
